# Patient Record
Sex: Male | Born: 1940
Health system: Southern US, Community
[De-identification: ages and names within clinical notes are randomized; demographics above are authoritative.]

## PROBLEM LIST (undated history)

## (undated) DIAGNOSIS — E7800 Pure hypercholesterolemia, unspecified: Secondary | ICD-10-CM

## (undated) DIAGNOSIS — K219 Gastro-esophageal reflux disease without esophagitis: Secondary | ICD-10-CM

## (undated) DIAGNOSIS — K649 Unspecified hemorrhoids: Secondary | ICD-10-CM

## (undated) DIAGNOSIS — Q2381 Bicuspid aortic valve: Secondary | ICD-10-CM

## (undated) DIAGNOSIS — I7121 Aneurysm of the ascending aorta, without rupture: Secondary | ICD-10-CM

## (undated) DIAGNOSIS — M545 Low back pain, unspecified: Secondary | ICD-10-CM

## (undated) DIAGNOSIS — I5031 Acute diastolic (congestive) heart failure: Secondary | ICD-10-CM

## (undated) DIAGNOSIS — K648 Other hemorrhoids: Secondary | ICD-10-CM

## (undated) DIAGNOSIS — K760 Fatty (change of) liver, not elsewhere classified: Secondary | ICD-10-CM

## (undated) DIAGNOSIS — T148XXA Other injury of unspecified body region, initial encounter: Secondary | ICD-10-CM

## (undated) DIAGNOSIS — E876 Hypokalemia: Secondary | ICD-10-CM

## (undated) DIAGNOSIS — M1990 Unspecified osteoarthritis, unspecified site: Secondary | ICD-10-CM

## (undated) DIAGNOSIS — D62 Acute posthemorrhagic anemia: Secondary | ICD-10-CM

## (undated) DIAGNOSIS — D126 Benign neoplasm of colon, unspecified: Secondary | ICD-10-CM

## (undated) DIAGNOSIS — Q231 Congenital insufficiency of aortic valve: Secondary | ICD-10-CM

## (undated) DIAGNOSIS — C801 Malignant (primary) neoplasm, unspecified: Secondary | ICD-10-CM

## (undated) DIAGNOSIS — I10 Essential (primary) hypertension: Secondary | ICD-10-CM

## (undated) DIAGNOSIS — I712 Thoracic aortic aneurysm, without rupture: Secondary | ICD-10-CM

## (undated) DIAGNOSIS — I350 Nonrheumatic aortic (valve) stenosis: Secondary | ICD-10-CM

## (undated) DIAGNOSIS — I480 Paroxysmal atrial fibrillation: Secondary | ICD-10-CM

## (undated) DIAGNOSIS — J189 Pneumonia, unspecified organism: Secondary | ICD-10-CM

## (undated) DIAGNOSIS — K922 Gastrointestinal hemorrhage, unspecified: Secondary | ICD-10-CM

## (undated) HISTORY — DX: Essential (primary) hypertension: I10

## (undated) HISTORY — DX: Fatty (change of) liver, not elsewhere classified: K76.0

## (undated) HISTORY — DX: Other hemorrhoids: K64.8

## (undated) HISTORY — DX: Pneumonia, unspecified organism: J18.9

## (undated) HISTORY — DX: Unspecified hemorrhoids: K64.9

## (undated) HISTORY — PX: COLONOSCOPY: SHX174

## (undated) HISTORY — PX: LUMBAR LAMINECTOMY: SHX95

## (undated) HISTORY — DX: Hypokalemia: E87.6

## (undated) HISTORY — DX: Low back pain, unspecified: M54.50

## (undated) HISTORY — DX: Low back pain: M54.5

## (undated) HISTORY — DX: Benign neoplasm of colon, unspecified: D12.6

## (undated) HISTORY — DX: Pure hypercholesterolemia, unspecified: E78.00

## (undated) HISTORY — PX: BACK SURGERY: SHX140

## (undated) HISTORY — DX: Unspecified osteoarthritis, unspecified site: M19.90

## (undated) HISTORY — PX: HEMORRHOID SURGERY: SHX153

## (undated) HISTORY — PX: POLYPECTOMY: SHX149

---

## 2001-03-27 ENCOUNTER — Encounter: Payer: Self-pay | Admitting: Internal Medicine

## 2001-03-27 ENCOUNTER — Ambulatory Visit (HOSPITAL_COMMUNITY): Admission: RE | Admit: 2001-03-27 | Discharge: 2001-03-27 | Payer: Self-pay | Admitting: Internal Medicine

## 2004-12-14 ENCOUNTER — Ambulatory Visit: Payer: Self-pay | Admitting: Internal Medicine

## 2004-12-16 ENCOUNTER — Ambulatory Visit: Payer: Self-pay | Admitting: Internal Medicine

## 2004-12-21 ENCOUNTER — Ambulatory Visit: Payer: Self-pay | Admitting: Cardiology

## 2005-05-19 ENCOUNTER — Ambulatory Visit: Payer: Self-pay | Admitting: Internal Medicine

## 2005-07-16 ENCOUNTER — Ambulatory Visit: Payer: Self-pay | Admitting: Internal Medicine

## 2005-07-20 ENCOUNTER — Ambulatory Visit: Payer: Self-pay | Admitting: Internal Medicine

## 2005-11-04 ENCOUNTER — Ambulatory Visit: Payer: Self-pay | Admitting: Internal Medicine

## 2006-01-31 ENCOUNTER — Ambulatory Visit: Payer: Self-pay | Admitting: Internal Medicine

## 2006-02-01 ENCOUNTER — Ambulatory Visit: Payer: Self-pay | Admitting: Internal Medicine

## 2006-03-23 ENCOUNTER — Ambulatory Visit: Payer: Self-pay | Admitting: Internal Medicine

## 2006-03-25 ENCOUNTER — Ambulatory Visit: Payer: Self-pay | Admitting: Internal Medicine

## 2006-05-04 ENCOUNTER — Ambulatory Visit: Payer: Self-pay | Admitting: Internal Medicine

## 2006-05-06 ENCOUNTER — Ambulatory Visit: Payer: Self-pay | Admitting: Internal Medicine

## 2006-08-26 ENCOUNTER — Ambulatory Visit: Payer: Self-pay | Admitting: Internal Medicine

## 2006-11-22 ENCOUNTER — Ambulatory Visit: Payer: Self-pay | Admitting: Internal Medicine

## 2006-11-22 LAB — CONVERTED CEMR LAB
ALT: 39 units/L (ref 0–40)
AST: 26 units/L (ref 0–37)
Albumin: 4 g/dL (ref 3.5–5.2)
Alkaline Phosphatase: 64 units/L (ref 39–117)
BUN: 20 mg/dL (ref 6–23)
Bilirubin, Direct: 0.1 mg/dL (ref 0.0–0.3)
CO2: 30 meq/L (ref 19–32)
Calcium: 9.1 mg/dL (ref 8.4–10.5)
Chloride: 106 meq/L (ref 96–112)
Cholesterol: 203 mg/dL (ref 0–200)
Creatinine, Ser: 0.9 mg/dL (ref 0.4–1.5)
Direct LDL: 141.8 mg/dL
GFR calc Af Amer: 109 mL/min
GFR calc non Af Amer: 90 mL/min
Glucose, Bld: 110 mg/dL — ABNORMAL HIGH (ref 70–99)
HDL: 35.4 mg/dL — ABNORMAL LOW (ref >39.0)
Hgb A1c MFr Bld: 5.6 % (ref 4.6–6.0)
Potassium: 3.9 meq/L (ref 3.5–5.1)
Sodium: 143 meq/L (ref 135–145)
Total Bilirubin: 0.6 mg/dL (ref 0.3–1.2)
Total CHOL/HDL Ratio: 5.7
Total Protein: 6.5 g/dL (ref 6.0–8.3)
Triglycerides: 200 mg/dL — ABNORMAL HIGH (ref 0–149)
VLDL: 40 mg/dL (ref 0–40)

## 2006-11-24 ENCOUNTER — Ambulatory Visit: Payer: Self-pay | Admitting: Internal Medicine

## 2006-12-21 ENCOUNTER — Ambulatory Visit: Payer: Self-pay | Admitting: Internal Medicine

## 2007-02-27 ENCOUNTER — Ambulatory Visit: Payer: Self-pay | Admitting: Internal Medicine

## 2007-03-14 ENCOUNTER — Ambulatory Visit: Payer: Self-pay

## 2007-03-27 ENCOUNTER — Ambulatory Visit: Payer: Self-pay | Admitting: Gastroenterology

## 2007-04-08 ENCOUNTER — Encounter: Payer: Self-pay | Admitting: Internal Medicine

## 2007-04-08 DIAGNOSIS — B009 Herpesviral infection, unspecified: Secondary | ICD-10-CM | POA: Insufficient documentation

## 2007-04-08 DIAGNOSIS — I10 Essential (primary) hypertension: Secondary | ICD-10-CM | POA: Insufficient documentation

## 2007-04-08 DIAGNOSIS — E785 Hyperlipidemia, unspecified: Secondary | ICD-10-CM | POA: Insufficient documentation

## 2007-04-08 DIAGNOSIS — I6529 Occlusion and stenosis of unspecified carotid artery: Secondary | ICD-10-CM | POA: Insufficient documentation

## 2007-04-08 DIAGNOSIS — M549 Dorsalgia, unspecified: Secondary | ICD-10-CM | POA: Insufficient documentation

## 2007-04-21 ENCOUNTER — Ambulatory Visit: Payer: Self-pay | Admitting: Gastroenterology

## 2007-08-09 ENCOUNTER — Ambulatory Visit: Payer: Self-pay | Admitting: Internal Medicine

## 2007-08-09 DIAGNOSIS — R7309 Other abnormal glucose: Secondary | ICD-10-CM | POA: Insufficient documentation

## 2007-08-09 LAB — CONVERTED CEMR LAB
ALT: 25 units/L (ref 0–53)
AST: 23 units/L (ref 0–37)
Albumin: 4 g/dL (ref 3.5–5.2)
Alkaline Phosphatase: 66 units/L (ref 39–117)
BUN: 18 mg/dL (ref 6–23)
Bilirubin, Direct: 0.2 mg/dL (ref 0.0–0.3)
CO2: 29 meq/L (ref 19–32)
Calcium: 9.4 mg/dL (ref 8.4–10.5)
Chloride: 108 meq/L (ref 96–112)
Cholesterol: 208 mg/dL (ref 0–200)
Creatinine, Ser: 1.1 mg/dL (ref 0.4–1.5)
Direct LDL: 150.7 mg/dL
GFR calc Af Amer: 86 mL/min
GFR calc non Af Amer: 71 mL/min
Glucose, Bld: 107 mg/dL — ABNORMAL HIGH (ref 70–99)
HDL: 32.3 mg/dL — ABNORMAL LOW (ref >39.0)
Hgb A1c MFr Bld: 5.4 % (ref 4.6–6.0)
Potassium: 4.9 meq/L (ref 3.5–5.1)
Sodium: 144 meq/L (ref 135–145)
Total Bilirubin: 0.9 mg/dL (ref 0.3–1.2)
Total CHOL/HDL Ratio: 6.4
Total Protein: 6.4 g/dL (ref 6.0–8.3)
Triglycerides: 142 mg/dL (ref 0–149)
VLDL: 28 mg/dL (ref 0–40)

## 2007-08-14 ENCOUNTER — Ambulatory Visit: Payer: Self-pay | Admitting: Internal Medicine

## 2007-08-14 DIAGNOSIS — M1990 Unspecified osteoarthritis, unspecified site: Secondary | ICD-10-CM | POA: Insufficient documentation

## 2007-09-14 DIAGNOSIS — I499 Cardiac arrhythmia, unspecified: Secondary | ICD-10-CM

## 2007-09-14 HISTORY — DX: Cardiac arrhythmia, unspecified: I49.9

## 2007-11-20 ENCOUNTER — Ambulatory Visit: Payer: Self-pay | Admitting: Internal Medicine

## 2007-11-20 LAB — CONVERTED CEMR LAB
ALT: 39 units/L (ref 0–53)
AST: 30 units/L (ref 0–37)
Albumin: 4.4 g/dL (ref 3.5–5.2)
Alkaline Phosphatase: 74 units/L (ref 39–117)
BUN: 17 mg/dL (ref 6–23)
Bilirubin, Direct: 0.1 mg/dL (ref 0.0–0.3)
CO2: 30 meq/L (ref 19–32)
Calcium: 9.5 mg/dL (ref 8.4–10.5)
Chloride: 104 meq/L (ref 96–112)
Cholesterol: 201 mg/dL (ref 0–200)
Creatinine, Ser: 1.1 mg/dL (ref 0.4–1.5)
Direct LDL: 151.5 mg/dL
GFR calc Af Amer: 86 mL/min
GFR calc non Af Amer: 71 mL/min
Glucose, Bld: 101 mg/dL — ABNORMAL HIGH (ref 70–99)
HDL: 35.5 mg/dL — ABNORMAL LOW (ref >39.0)
Potassium: 4.7 meq/L (ref 3.5–5.1)
Sodium: 141 meq/L (ref 135–145)
TSH: 3.45 microintl units/mL (ref 0.35–5.50)
Total Bilirubin: 0.9 mg/dL (ref 0.3–1.2)
Total CHOL/HDL Ratio: 5.7
Total Protein: 7.2 g/dL (ref 6.0–8.3)
Triglycerides: 132 mg/dL (ref 0–149)
VLDL: 26 mg/dL (ref 0–40)

## 2007-11-23 ENCOUNTER — Ambulatory Visit: Payer: Self-pay | Admitting: Internal Medicine

## 2007-11-23 DIAGNOSIS — B356 Tinea cruris: Secondary | ICD-10-CM | POA: Insufficient documentation

## 2008-04-03 ENCOUNTER — Ambulatory Visit: Payer: Self-pay

## 2008-04-03 ENCOUNTER — Encounter: Payer: Self-pay | Admitting: Internal Medicine

## 2008-05-21 ENCOUNTER — Ambulatory Visit: Payer: Self-pay | Admitting: Internal Medicine

## 2008-05-21 LAB — CONVERTED CEMR LAB
ALT: 30 units/L (ref 0–53)
AST: 23 units/L (ref 0–37)
Albumin: 4 g/dL (ref 3.5–5.2)
Alkaline Phosphatase: 63 units/L (ref 39–117)
BUN: 20 mg/dL (ref 6–23)
Basophils Absolute: 0 10*3/uL (ref 0.0–0.1)
Basophils Relative: 0.6 % (ref 0.0–3.0)
Bilirubin Urine: NEGATIVE
Bilirubin, Direct: 0.1 mg/dL (ref 0.0–0.3)
CO2: 28 meq/L (ref 19–32)
Calcium: 9.4 mg/dL (ref 8.4–10.5)
Chloride: 108 meq/L (ref 96–112)
Cholesterol: 187 mg/dL (ref 0–200)
Creatinine, Ser: 1.1 mg/dL (ref 0.4–1.5)
Eosinophils Absolute: 0.3 10*3/uL (ref 0.0–0.7)
Eosinophils Relative: 3.6 % (ref 0.0–5.0)
GFR calc Af Amer: 86 mL/min
GFR calc non Af Amer: 71 mL/min
Glucose, Bld: 110 mg/dL — ABNORMAL HIGH (ref 70–99)
HCT: 44.6 % (ref 39.0–52.0)
HDL: 32.5 mg/dL — ABNORMAL LOW (ref >39.0)
Hemoglobin: 15.6 g/dL (ref 13.0–17.0)
Ketones, ur: NEGATIVE mg/dL
LDL Cholesterol: 118 mg/dL — ABNORMAL HIGH (ref 0–99)
Leukocytes, UA: NEGATIVE
Lymphocytes Relative: 23.1 % (ref 12.0–46.0)
MCHC: 34.9 g/dL (ref 30.0–36.0)
MCV: 91.2 fL (ref 78.0–100.0)
Monocytes Absolute: 0.8 10*3/uL (ref 0.1–1.0)
Monocytes Relative: 10.5 % (ref 3.0–12.0)
Neutro Abs: 4.4 10*3/uL (ref 1.4–7.7)
Neutrophils Relative %: 62.2 % (ref 43.0–77.0)
Nitrite: NEGATIVE
PSA: 1.72 ng/mL (ref 0.10–4.00)
Platelets: 194 10*3/uL (ref 150–400)
Potassium: 4.1 meq/L (ref 3.5–5.1)
RBC: 4.89 M/uL (ref 4.22–5.81)
RDW: 12 % (ref 11.5–14.6)
Sodium: 142 meq/L (ref 135–145)
Specific Gravity, Urine: 1.025 (ref 1.000–1.03)
TSH: 4.1 microintl units/mL (ref 0.35–5.50)
Total Bilirubin: 0.8 mg/dL (ref 0.3–1.2)
Total CHOL/HDL Ratio: 5.8
Total Protein, Urine: NEGATIVE mg/dL
Total Protein: 6.7 g/dL (ref 6.0–8.3)
Triglycerides: 184 mg/dL — ABNORMAL HIGH (ref 0–149)
Urine Glucose: NEGATIVE mg/dL
Urobilinogen, UA: 0.2 (ref 0.0–1.0)
VLDL: 37 mg/dL (ref 0–40)
WBC: 7.2 10*3/uL (ref 4.5–10.5)
pH: 5.5 (ref 5.0–8.0)

## 2008-05-27 ENCOUNTER — Ambulatory Visit: Payer: Self-pay | Admitting: Internal Medicine

## 2008-07-28 ENCOUNTER — Ambulatory Visit: Payer: Self-pay | Admitting: Internal Medicine

## 2008-07-28 ENCOUNTER — Inpatient Hospital Stay (HOSPITAL_COMMUNITY): Admission: EM | Admit: 2008-07-28 | Discharge: 2008-07-30 | Payer: Self-pay | Admitting: Emergency Medicine

## 2008-07-28 ENCOUNTER — Ambulatory Visit: Payer: Self-pay | Admitting: Cardiology

## 2008-07-29 ENCOUNTER — Encounter: Payer: Self-pay | Admitting: Internal Medicine

## 2008-08-01 ENCOUNTER — Encounter (INDEPENDENT_AMBULATORY_CARE_PROVIDER_SITE_OTHER): Payer: Self-pay | Admitting: *Deleted

## 2008-08-05 ENCOUNTER — Ambulatory Visit: Payer: Self-pay | Admitting: Internal Medicine

## 2008-08-05 DIAGNOSIS — I480 Paroxysmal atrial fibrillation: Secondary | ICD-10-CM | POA: Insufficient documentation

## 2008-08-13 ENCOUNTER — Ambulatory Visit: Payer: Self-pay | Admitting: Cardiology

## 2008-08-13 LAB — CONVERTED CEMR LAB
Free T4: 0.6 ng/dL (ref 0.6–1.6)
TSH: 3.14 microintl units/mL (ref 0.35–5.50)

## 2008-10-03 ENCOUNTER — Ambulatory Visit: Payer: Self-pay | Admitting: Internal Medicine

## 2008-10-03 LAB — CONVERTED CEMR LAB
ALT: 39 units/L (ref 0–53)
AST: 27 units/L (ref 0–37)
Albumin: 4.2 g/dL (ref 3.5–5.2)
Alkaline Phosphatase: 56 units/L (ref 39–117)
BUN: 17 mg/dL (ref 6–23)
Basophils Absolute: 0 10*3/uL (ref 0.0–0.1)
Basophils Relative: 0.3 % (ref 0.0–3.0)
Bilirubin, Direct: 0.1 mg/dL (ref 0.0–0.3)
CO2: 32 meq/L (ref 19–32)
Calcium: 9.6 mg/dL (ref 8.4–10.5)
Chloride: 106 meq/L (ref 96–112)
Cholesterol: 121 mg/dL (ref 0–200)
Creatinine, Ser: 1.1 mg/dL (ref 0.4–1.5)
Eosinophils Absolute: 0.2 10*3/uL (ref 0.0–0.7)
Eosinophils Relative: 3.6 % (ref 0.0–5.0)
GFR calc Af Amer: 86 mL/min
GFR calc non Af Amer: 71 mL/min
Glucose, Bld: 120 mg/dL — ABNORMAL HIGH (ref 70–99)
HCT: 44.9 % (ref 39.0–52.0)
HDL: 31.5 mg/dL — ABNORMAL LOW (ref >39.0)
Hemoglobin: 15.6 g/dL (ref 13.0–17.0)
LDL Cholesterol: 67 mg/dL (ref 0–99)
Lymphocytes Relative: 23.9 % (ref 12.0–46.0)
MCHC: 34.8 g/dL (ref 30.0–36.0)
MCV: 90.2 fL (ref 78.0–100.0)
Monocytes Absolute: 0.6 10*3/uL (ref 0.1–1.0)
Monocytes Relative: 8.1 % (ref 3.0–12.0)
Neutro Abs: 4.4 10*3/uL (ref 1.4–7.7)
Neutrophils Relative %: 64.1 % (ref 43.0–77.0)
Platelets: 182 10*3/uL (ref 150–400)
Potassium: 4.4 meq/L (ref 3.5–5.1)
RBC: 4.98 M/uL (ref 4.22–5.81)
RDW: 11.5 % (ref 11.5–14.6)
Sodium: 143 meq/L (ref 135–145)
TSH: 2.88 microintl units/mL (ref 0.35–5.50)
Total Bilirubin: 0.8 mg/dL (ref 0.3–1.2)
Total CHOL/HDL Ratio: 3.8
Total CK: 111 units/L (ref 7–195)
Total Protein: 7.2 g/dL (ref 6.0–8.3)
Triglycerides: 112 mg/dL (ref 0–149)
VLDL: 22 mg/dL (ref 0–40)
WBC: 6.8 10*3/uL (ref 4.5–10.5)

## 2008-10-07 ENCOUNTER — Ambulatory Visit: Payer: Self-pay | Admitting: Internal Medicine

## 2008-10-29 ENCOUNTER — Ambulatory Visit: Payer: Self-pay | Admitting: Internal Medicine

## 2008-10-29 DIAGNOSIS — K6289 Other specified diseases of anus and rectum: Secondary | ICD-10-CM | POA: Insufficient documentation

## 2008-10-29 DIAGNOSIS — K649 Unspecified hemorrhoids: Secondary | ICD-10-CM | POA: Insufficient documentation

## 2008-12-04 ENCOUNTER — Telehealth: Payer: Self-pay | Admitting: Internal Medicine

## 2008-12-06 ENCOUNTER — Telehealth: Payer: Self-pay | Admitting: Gastroenterology

## 2008-12-06 ENCOUNTER — Telehealth: Payer: Self-pay | Admitting: Internal Medicine

## 2008-12-31 ENCOUNTER — Encounter: Payer: Self-pay | Admitting: Internal Medicine

## 2009-01-21 ENCOUNTER — Ambulatory Visit (HOSPITAL_COMMUNITY): Admission: RE | Admit: 2009-01-21 | Discharge: 2009-01-21 | Payer: Self-pay | Admitting: Surgery

## 2009-01-21 ENCOUNTER — Encounter (INDEPENDENT_AMBULATORY_CARE_PROVIDER_SITE_OTHER): Payer: Self-pay | Admitting: Surgery

## 2009-03-06 ENCOUNTER — Ambulatory Visit: Payer: Self-pay | Admitting: Internal Medicine

## 2009-03-06 LAB — CONVERTED CEMR LAB
BUN: 14 mg/dL (ref 6–23)
CO2: 31 meq/L (ref 19–32)
Calcium: 9.2 mg/dL (ref 8.4–10.5)
Chloride: 105 meq/L (ref 96–112)
Cholesterol: 137 mg/dL (ref 0–200)
Creatinine, Ser: 0.9 mg/dL (ref 0.4–1.5)
GFR calc non Af Amer: 88.92 mL/min (ref >60)
Glucose, Bld: 108 mg/dL — ABNORMAL HIGH (ref 70–99)
HDL: 36.8 mg/dL — ABNORMAL LOW (ref >39.00)
LDL Cholesterol: 85 mg/dL (ref 0–99)
Potassium: 4.6 meq/L (ref 3.5–5.1)
Sodium: 145 meq/L (ref 135–145)
TSH: 2.18 microintl units/mL (ref 0.35–5.50)
Total CHOL/HDL Ratio: 4
Triglycerides: 76 mg/dL (ref 0.0–149.0)
VLDL: 15.2 mg/dL (ref 0.0–40.0)

## 2009-03-10 ENCOUNTER — Ambulatory Visit: Payer: Self-pay | Admitting: Internal Medicine

## 2009-03-11 ENCOUNTER — Ambulatory Visit: Payer: Self-pay | Admitting: Cardiology

## 2009-06-30 ENCOUNTER — Ambulatory Visit: Payer: Self-pay | Admitting: Internal Medicine

## 2009-06-30 DIAGNOSIS — Z87891 Personal history of nicotine dependence: Secondary | ICD-10-CM | POA: Insufficient documentation

## 2009-12-24 ENCOUNTER — Ambulatory Visit: Payer: Self-pay | Admitting: Internal Medicine

## 2009-12-24 LAB — CONVERTED CEMR LAB
ALT: 35 units/L (ref 0–53)
AST: 32 units/L (ref 0–37)
Albumin: 4.3 g/dL (ref 3.5–5.2)
Alkaline Phosphatase: 65 units/L (ref 39–117)
BUN: 16 mg/dL (ref 6–23)
Basophils Absolute: 0 10*3/uL (ref 0.0–0.1)
Basophils Relative: 0.3 % (ref 0.0–3.0)
Bilirubin Urine: NEGATIVE
Bilirubin, Direct: 0.1 mg/dL (ref 0.0–0.3)
CO2: 32 meq/L (ref 19–32)
Calcium: 9.3 mg/dL (ref 8.4–10.5)
Chloride: 104 meq/L (ref 96–112)
Cholesterol: 140 mg/dL (ref 0–200)
Creatinine, Ser: 1.1 mg/dL (ref 0.4–1.5)
Eosinophils Absolute: 0.2 10*3/uL (ref 0.0–0.7)
Eosinophils Relative: 2.5 % (ref 0.0–5.0)
GFR calc non Af Amer: 70.37 mL/min (ref >60)
Glucose, Bld: 116 mg/dL — ABNORMAL HIGH (ref 70–99)
HCT: 44.6 % (ref 39.0–52.0)
HDL: 37.5 mg/dL — ABNORMAL LOW (ref >39.00)
Hemoglobin, Urine: NEGATIVE
Hemoglobin: 15.3 g/dL (ref 13.0–17.0)
Ketones, ur: NEGATIVE mg/dL
LDL Cholesterol: 76 mg/dL (ref 0–99)
Leukocytes, UA: NEGATIVE
Lymphocytes Relative: 20.6 % (ref 12.0–46.0)
Lymphs Abs: 1.7 10*3/uL (ref 0.7–4.0)
MCHC: 34.3 g/dL (ref 30.0–36.0)
MCV: 91.6 fL (ref 78.0–100.0)
Monocytes Absolute: 0.6 10*3/uL (ref 0.1–1.0)
Monocytes Relative: 7.3 % (ref 3.0–12.0)
Neutro Abs: 5.8 10*3/uL (ref 1.4–7.7)
Neutrophils Relative %: 69.3 % (ref 43.0–77.0)
Nitrite: NEGATIVE
PSA: 1.82 ng/mL (ref 0.10–4.00)
Platelets: 178 10*3/uL (ref 150.0–400.0)
Potassium: 3.9 meq/L (ref 3.5–5.1)
RBC: 4.87 M/uL (ref 4.22–5.81)
RDW: 12.5 % (ref 11.5–14.6)
Sodium: 143 meq/L (ref 135–145)
Specific Gravity, Urine: 1.01 (ref 1.000–1.030)
TSH: 4.12 microintl units/mL (ref 0.35–5.50)
Total Bilirubin: 0.4 mg/dL (ref 0.3–1.2)
Total CHOL/HDL Ratio: 4
Total Protein, Urine: NEGATIVE mg/dL
Total Protein: 7.7 g/dL (ref 6.0–8.3)
Triglycerides: 131 mg/dL (ref 0.0–149.0)
Urine Glucose: NEGATIVE mg/dL
Urobilinogen, UA: 0.2 (ref 0.0–1.0)
VLDL: 26.2 mg/dL (ref 0.0–40.0)
WBC: 8.4 10*3/uL (ref 4.5–10.5)
pH: 6 (ref 5.0–8.0)

## 2009-12-29 ENCOUNTER — Ambulatory Visit: Payer: Self-pay | Admitting: Internal Medicine

## 2009-12-29 DIAGNOSIS — K5909 Other constipation: Secondary | ICD-10-CM | POA: Insufficient documentation

## 2010-03-10 ENCOUNTER — Ambulatory Visit: Payer: Self-pay | Admitting: Cardiology

## 2010-03-10 DIAGNOSIS — I359 Nonrheumatic aortic valve disorder, unspecified: Secondary | ICD-10-CM | POA: Insufficient documentation

## 2010-03-30 ENCOUNTER — Ambulatory Visit: Payer: Self-pay

## 2010-03-30 ENCOUNTER — Ambulatory Visit (HOSPITAL_COMMUNITY): Admission: RE | Admit: 2010-03-30 | Discharge: 2010-03-30 | Payer: Self-pay | Admitting: Cardiology

## 2010-03-30 ENCOUNTER — Ambulatory Visit: Payer: Self-pay | Admitting: Cardiovascular Disease

## 2010-03-30 ENCOUNTER — Encounter: Payer: Self-pay | Admitting: Cardiology

## 2010-06-22 ENCOUNTER — Ambulatory Visit: Payer: Self-pay | Admitting: Internal Medicine

## 2010-07-22 ENCOUNTER — Ambulatory Visit: Payer: Self-pay | Admitting: Internal Medicine

## 2010-07-22 LAB — CONVERTED CEMR LAB
ALT: 35 units/L (ref 0–53)
AST: 36 units/L (ref 0–37)
Albumin: 4.1 g/dL (ref 3.5–5.2)
Alkaline Phosphatase: 76 units/L (ref 39–117)
BUN: 17 mg/dL (ref 6–23)
Bilirubin, Direct: 0.1 mg/dL (ref 0.0–0.3)
CO2: 30 meq/L (ref 19–32)
Calcium: 9.3 mg/dL (ref 8.4–10.5)
Chloride: 103 meq/L (ref 96–112)
Cholesterol: 142 mg/dL (ref 0–200)
Creatinine, Ser: 1 mg/dL (ref 0.4–1.5)
GFR calc non Af Amer: 76.65 mL/min (ref >60)
Glucose, Bld: 112 mg/dL — ABNORMAL HIGH (ref 70–99)
HDL: 31.7 mg/dL — ABNORMAL LOW (ref >39.00)
LDL Cholesterol: 79 mg/dL (ref 0–99)
Potassium: 4 meq/L (ref 3.5–5.1)
Sodium: 141 meq/L (ref 135–145)
TSH: 4.29 microintl units/mL (ref 0.35–5.50)
Total Bilirubin: 0.4 mg/dL (ref 0.3–1.2)
Total CHOL/HDL Ratio: 4
Total Protein: 6.9 g/dL (ref 6.0–8.3)
Triglycerides: 156 mg/dL — ABNORMAL HIGH (ref 0.0–149.0)
VLDL: 31.2 mg/dL (ref 0.0–40.0)

## 2010-07-27 ENCOUNTER — Ambulatory Visit: Payer: Self-pay | Admitting: Internal Medicine

## 2010-08-10 ENCOUNTER — Encounter: Payer: Self-pay | Admitting: Internal Medicine

## 2010-08-10 ENCOUNTER — Ambulatory Visit: Payer: Self-pay | Admitting: Internal Medicine

## 2010-08-10 DIAGNOSIS — R42 Dizziness and giddiness: Secondary | ICD-10-CM | POA: Insufficient documentation

## 2010-08-11 ENCOUNTER — Emergency Department (HOSPITAL_COMMUNITY)
Admission: EM | Admit: 2010-08-11 | Discharge: 2010-08-12 | Payer: Self-pay | Source: Home / Self Care | Admitting: Emergency Medicine

## 2010-08-13 ENCOUNTER — Encounter: Payer: Self-pay | Admitting: Physician Assistant

## 2010-08-13 ENCOUNTER — Ambulatory Visit: Payer: Self-pay | Admitting: Cardiovascular Disease

## 2010-08-25 ENCOUNTER — Telehealth (INDEPENDENT_AMBULATORY_CARE_PROVIDER_SITE_OTHER): Payer: Self-pay | Admitting: *Deleted

## 2010-08-26 ENCOUNTER — Ambulatory Visit: Payer: Self-pay | Admitting: Cardiology

## 2010-08-26 ENCOUNTER — Ambulatory Visit: Payer: Self-pay

## 2010-08-26 ENCOUNTER — Encounter: Payer: Self-pay | Admitting: *Deleted

## 2010-08-26 ENCOUNTER — Encounter: Payer: Self-pay | Admitting: Cardiology

## 2010-08-26 ENCOUNTER — Encounter (HOSPITAL_COMMUNITY)
Admission: RE | Admit: 2010-08-26 | Discharge: 2010-10-13 | Payer: Self-pay | Source: Home / Self Care | Attending: Cardiology | Admitting: Cardiology

## 2010-09-17 ENCOUNTER — Ambulatory Visit
Admission: RE | Admit: 2010-09-17 | Discharge: 2010-09-17 | Payer: Self-pay | Source: Home / Self Care | Attending: Cardiology | Admitting: Cardiology

## 2010-10-09 ENCOUNTER — Ambulatory Visit: Admit: 2010-10-09 | Payer: Self-pay | Admitting: Cardiology

## 2010-10-15 NOTE — Progress Notes (Signed)
Summary: Rectal Pain   Phone Note From Other Clinic   Caller: MARY X [phone] @ DR [name] Call For: DR Jarold Motto Reason for Call: Schedule Patient Appt Summary of Call: First available NP3 is 01-07-09. Would like pt seen sooner for Rectal Pain. Initial call taken by: Leanor Kail [employer],  December 06, 2008 9:19 AM  Follow-up for Phone Call        left message on [facility] VM Follow-up by: Harlow Mares CMA,  December 06, 2008 10:18 AM  Additional Follow-up for Phone Call Additional follow up Details #1::        called mary back and gave her a appt for 12-17-2008 at 2:45pm she will advise the patient . Additional Follow-up by: Harlow Mares CMA,  December 06, 2008 10:23 AM

## 2010-10-15 NOTE — Assessment & Plan Note (Signed)
Summary: PHYSICAL-PT DON'T HAVE MEDICARE PER PT STILL WORKING-$50-STC   Vital Signs:  Patient Profile:   70 Years Old Male Weight:      213 pounds Temp:     97.7 degrees F oral Pulse rate:   72 / minute BP sitting:   146 / 80  (left arm)  Vitals Entered By: Tora Perches (May 27, 2008 9:00 AM)                 Chief Complaint:  Preventive Care.  History of Present Illness: The patient presents for a wellness examination.       Current Allergies (reviewed today): ! SULFA CODEINE  Past Medical History:    Reviewed history from 08/14/2007 and no changes required:       Hyperlipidemia       Hypertension       Osteoarthritis       Low back pain   Family History:    Reviewed history from 11/23/2007 and no changes required:       Family History Hypertension  Social History:    Reviewed history from 08/14/2007 and no changes required:       Occupation: Nurse, children's       Married       Never Smoked    Review of Systems  The patient denies breast masses, anorexia, fever, weight loss, weight gain, vision loss, decreased hearing, hoarseness, chest pain, syncope, dyspnea on exertion, peripheral edema, prolonged cough, headaches, hemoptysis, abdominal pain, melena, hematochezia, severe indigestion/heartburn, hematuria, incontinence, genital sores, muscle weakness, suspicious skin lesions, transient blindness, difficulty walking, depression, unusual weight change, abnormal bleeding, enlarged lymph nodes, angioedema, and testicular masses.     Physical Exam  General:     Well-developed,well-nourished,in no acute distress; alert,appropriate and cooperative throughout examination Head:     Normocephalic and atraumatic without obvious abnormalities. No apparent alopecia or balding. Eyes:     No corneal or conjunctival inflammation noted. EOMI. Perrla. Funduscopic exam benign, without hemorrhages, exudates or papilledema. Vision grossly normal. Ears:     External ear  exam shows no significant lesions or deformities.  Otoscopic examination reveals clear canals, tympanic membranes are intact bilaterally without bulging, retraction, inflammation or discharge. Hearing is grossly normal bilaterally. Nose:     External nasal examination shows no deformity or inflammation. Nasal mucosa are pink and moist without lesions or exudates. Mouth:     Oral mucosa and oropharynx without lesions or exudates.  Teeth in good repair. Neck:     No deformities, masses, or tenderness noted. Chest Wall:     No deformities, masses, tenderness or gynecomastia noted. Lungs:     Normal respiratory effort, chest expands symmetrically. Lungs are clear to auscultation, no crackles or wheezes. Heart:     Normal rate and regular rhythm. S1 and S2 normal without gallop, murmur, click, rub or other extra sounds. Abdomen:     Bowel sounds positive,abdomen soft and non-tender without masses, organomegaly or hernias noted. Rectal:     No external abnormalities noted. Normal sphincter tone. No rectal masses or tenderness. Genitalia:     Testes bilaterally descended without nodularity, tenderness or masses. No scrotal masses or lesions. No penis lesions or urethral discharge. Prostate:     Prostate gland firm and smooth, no enlargement, nodularity, tenderness, mass, asymmetry or induration. Msk:     No deformity or scoliosis noted of thoracic or lumbar spine.   Pulses:     R and L carotid,radial,femoral,dorsalis pedis and posterior tibial pulses are  full and equal bilaterally Extremities:     No clubbing, cyanosis, edema, or deformity noted with normal full range of motion of all joints.   Neurologic:     No cranial nerve deficits noted. Station and gait are normal. Plantar reflexes are down-going bilaterally. DTRs are symmetrical throughout. Sensory, motor and coordinative functions appear intact. Skin:     Intact without suspicious lesions or rashes Inguinal Nodes:     No significant  adenopathy Psych:     Cognition and judgment appear intact. Alert and cooperative with normal attention span and concentration. No apparent delusions, illusions, hallucinations    Impression & Recommendations:  Problem # 1:  WELL ADULT EXAM (ICD-V70.0) Assessment: Comment Only The labs were reviewd with the patient. l Orders: EKG w/ Interpretation (93000)  Reviewed preventive care protocols, scheduled due services, and updated immunizations.   Problem # 2:  HYPERTENSION (ICD-401.9)  His updated medication list for this problem includes:    Azor 10-40 Mg Tabs (Amlodipine-olmesartan) .[name]... 1 once daily    Triamterene-hctz 37.5-25 Mg Caps (Triamterene-hctz) .[name]... 1 po qam   Problem # 3:  HYPERLIPIDEMIA (ICD-272.4) Assessment: Comment Only  His updated medication list for this problem includes:    Lovastatin 40 Mg Tabs (Lovastatin) .[name]... 1 po qd   Complete Medication List: 1)  Azor 10-40 Mg Tabs (Amlodipine-olmesartan) .[name].. 1 once daily 2)  Triamterene-hctz 37.5-25 Mg Caps (Triamterene-hctz) .[name].. 1 po qam 3)  Lovastatin 40 Mg Tabs (Lovastatin) .[name].. 1 po qd 4)  Aspirin 325 Mg Tabs (Aspirin) .[name].. 1 qd 5)  Vitamin D3 1000 Unit Tabs (Cholecalciferol) .[name].. 1 qd   Patient Instructions: 1)  Please schedule a follow-up appointment in 6 months. 2)  BMP prior to visit, ICD-9: 3)  Hepatic Panel prior to visit, ICD-9: 4)  Lipid Panel prior to visit, ICD-9:272.0   Prescriptions: LOVASTATIN 40 MG TABS (LOVASTATIN) 1 po qd  #30 x 12   Entered and Authorized by:   Tresa Garter MD   Signed by:   Tresa Garter MD on 05/27/2008   Method used:   Print then Give to Patient   RxID:   [id number] TRIAMTERENE-HCTZ 37.5-25 MG CAPS (TRIAMTERENE-HCTZ) 1 po qam  #30 x 12   Entered and Authorized by:   Tresa Garter MD   Signed by:   Tresa Garter MD on 05/27/2008   Method used:   Print then Give to Patient   RxID:   [id number] AZOR 10-40 MG  TABS  (AMLODIPINE-OLMESARTAN) 1 once daily  #30 x 12   Entered and Authorized by:   Tresa Garter MD   Signed by:   Tresa Garter MD on 05/27/2008   Method used:   Print then Give to Patient   RxID:   [id number]  ]

## 2010-10-15 NOTE — Progress Notes (Signed)
Summary: Nuclear Pre-Procedure  Phone Note Outgoing Call   Call placed by: Milana Na, EMT-P,  August 25, 2010 4:12 PM Summary of Call: Left message with information on Myoview Information Sheet (see scanned document for details).      Nuclear Med Background Indications for Stress Test: Evaluation for Ischemia  Indications Comments: 08/12/10 ED CP (-) enzymes   History: Echo, GXT  History Comments: 2000 GXT NL 11/09 ECHO EF 55-65% H/O PAF RVR   Symptoms: Chest Tightness, Dizziness, Light-Headedness, Palpitations, SOB    Nuclear Pre-Procedure Cardiac Risk Factors: Carotid Disease, Hypertension, Lipids Height (in): 71  Nuclear Med Study Referring MD:  D.McLean

## 2010-10-15 NOTE — Assessment & Plan Note (Signed)
Summary: rov/afib   Visit Type:  Follow-up Primary Provider:  Tresa Garter MD  CC:  Chest tightness x 4 days with (L) leg tightness.  History of Present Illness: Primary Cardiologist:  Dr. Marca Ancona  [name] [name] is a 70 yo male with a h/o parox AFib and HTN who returns for follow up.  He is treated with ASA due to a low CHADS2 score.  He was seen  in his PCP's office 08/10/2010 when he walked in with chest pain.  He was found to be in AFib with RVR.  He was given NTG and he converted to NSR while in the office.  Since he was seen, he had a recurrence of chest discomfort.  He went to the emergency room on November 30.  Cardiac markers were negative.  His EKG demonstrated normal sinus rhythm.  His chest x-ray was normal.  He was asked to followup here today.  He denies exertional chest pain.  His chest discomfort when he had atrial fibrillation was described as a tightness.  His symptoms were similar a couple of years ago when he was first diagnosed with atrial fibrillation.  However, the symptoms were worse.  He felt lightheadedness/dizziness.  He denies syncope or near-syncope.  His symptoms quickly abated when normal sinus rhythm was documented on EKG.  He denies exertional shortness of breath.  He denies orthopnea or PND.  He denies edema.  Current Medications (verified): 1)  Toprol Xl 25 Mg Xr24h-Tab (Metoprolol Succinate) .... One-Half Tablet Daily 2)  Benazepril-Hydrochlorothiazide 20-25 Mg Tabs (Benazepril-Hydrochlorothiazide) .[name].. 1 By Mouth Qd 3)  Simvastatin 40 Mg Tabs (Simvastatin) .[name].. 1 By Mouth Qd 4)  Aspirin 325 Mg Tabs (Aspirin) .[name].. 1 Qd 5)  Amitiza 24 Mcg Caps (Lubiprostone) .[name].. 1 By Mouth Once Daily As Needed Constipation 6)  Triamcinolone Acetonide 0.5 % Crea (Triamcinolone Acetonide) .... Use Two Times A Day Prn  Allergies (verified): No Known Drug Allergies  Past History:  Social History: Last updated: 03/11/2009 The patient lives in [place].  He  is a native of [place].  He is an Art gallery manager at [employer].  He drinks a glass of wine nightly.  He is a nonsmoker.  He is a former Geophysicist/field seismologist.   Past Medical History: Reviewed history from 03/10/2010 and no changes required. 1. Hypertension. 2. Atrial fibrillation.  The patient had new-onset atrial fibrillation in November 2009.  His CHADS2 score is 1.  He underwent ibutilide cardioversion successfully.  He is not on Coumadin currently.  He is on aspirin 325 mg daily. 3. Hypercholesterolemia. 4. Echocardiogram in November 2009, EF was 65%.  There was mild LVH.  There was aortic sclerosis without stenosis (mean gradient 10 mmHg).  There was normal RV size and function.  5. Osteoarthritis. 6. Low back pain.  Review of Systems       As per  the HPI.  All other systems reviewed and negative.   Vital Signs:  Patient profile:   70 year old male Height:      71 inches Weight:      214 pounds BMI:     29.95 Pulse rate:   54 / minute Pulse rhythm:   regular BP sitting:   150 / 80  (left arm) Cuff size:   regular  Vitals Entered By: Stanton Kidney, EMT-P (August 13, 2010 11:10 AM)  Physical Exam  General:  Well nourished, well developed, in no acute distress HEENT: normal Neck: no JVD Endo: no thyromegaly Cardiac:  normal S1, S2; RRR; 2/6 systolic murmur along LSB Lungs:  clear to auscultation bilaterally, no wheezing, rhonchi or rales Abd: soft, nontender, no hepatomegaly Ext: no edema Vascular: no carotid  bruits Skin: warm and dry Neuro:  CNs 2-12 intact, no focal abnormalities noted    EKG  Procedure date:  08/13/2010  Findings:      Sinus Bradycardia Heart rate 54 Normal axis Nonspecific ST-T wave changes  Impression & Recommendations:  Problem # 1:  CHEST PAIN (ICD-786.50) I suspect his chest discomfort is all related to his AFib. However, he has HTN and is 70 yo and has a h/o minimal plaque on carotid dopplers 2 years ago. He also has mild to  moderate AS. I will set him up for a stress myoview to r/o ischemic heart disease.  Orders: Nuclear Stress Test (Nuc Stress Test)  Problem # 2:  ATRIAL FIBRILLATION (ICD-427.31)  TSH: 4.29 (07/22/2010 8:51:48 AM)   I will put him on an event monitor to see how much AFib he is having.  If he is having a lot of AFib and his stress test is normal, he may be a candidate for antiarrhythmic therapy (flecainide).  He will be brought back for f/u with Dr. Shirlee Latch after his stress test and his monitor.  CHADSVASC score is actually 2.  Will need to consider whether or not to switch to coumadin over time. Orders: Event (Event)  Problem # 3:  AORTIC STENOSIS (ICD-424.1)  Mild to moderate with a mean gradient of 18 by echo in 7.2011.  Problem # 4:  HYPERTENSION (ICD-401.9)  BP running high. Would strongly consider adding amlodipine.  He is somewhat hesitant to change any medications.   Would have to decrease simva or change to different statin.  Discussed this with him. Will monitor his BP for now.  Patient Instructions: 1)  Your physician recommends that you schedule a follow-up appointment in:  3 -4 weeks with Dr. Shirlee Latch 2)  Your physician has recommended that you wear an event monitor.  Event monitors are medical devices that record the heart's electrical activity. Doctors most often use these monitors to diagnose arrhythmias. Arrhythmias are problems with the speed or rhythm of the heartbeat. The monitor is a small, portable device. You can wear one while you do your normal daily activities. This is usually used to diagnose what is causing palpitations/syncope (passing out). 3)  Your physician has requested that you have an exercise stress myoview.  For further information please visit [email or web address]  Please follow instruction sheet, as given. HOLD TOPROL XL THE DAY BEFORE AND DAY OF STRESS TEST.

## 2010-10-15 NOTE — Assessment & Plan Note (Signed)
Summary: walk in/elev heart rate/sob/cd   Primary Care Provider:  Tresa Garter MD   History of Present Illness: C/o chest heaviness, SOB, palpitations starting 8 am - worse with exrtion. He felt dizzy at times. He walked in the office and was seen during lunch break.  Allergies: No Known Drug Allergies  Past History:  Past Medical History: Last updated: 03/10/2010 1. Hypertension. 2. Atrial fibrillation.  The patient had new-onset atrial fibrillation in November 2009.  His CHADS2 score is 1.  He underwent ibutilide cardioversion successfully.  He is not on Coumadin currently.  He is on aspirin 325 mg daily. 3. Hypercholesterolemia. 4. Echocardiogram in November 2009, EF was 65%.  There was mild LVH.  There was aortic sclerosis without stenosis (mean gradient 10 mmHg).  There was normal RV size and function.  5. Osteoarthritis. 6. Low back pain.  Past Surgical History: Last updated: 03/10/2009 Back surgery x12 years ago Lumbar laminectomy Hemorrhoidectomy 2010  Family History: Last updated: 08/28/2008 Hypertension. No early CAD.   Social History: Last updated: 03/11/2009 The patient lives in [place].  He is a native of [place].  He is an Art gallery manager at [employer].  He drinks a glass of wine nightly.  He is a nonsmoker.  He is a former Geophysicist/field seismologist.   Review of Systems  The patient denies fever, weight loss, prolonged cough, abdominal pain, melena, muscle weakness, and difficulty walking.    Physical Exam  General:  NAD Head:  Normocephalic and atraumatic without obvious abnormalities. No apparent alopecia or balding. Eyes:  No corneal or conjunctival inflammation noted. EOMI. Perrla. Funduscopic exam benign, without hemorrhages, exudates or papilledema. Vision grossly normal. Nose:  External nasal examination shows no deformity or inflammation. Nasal mucosa are pink and moist without lesions or exudates. Mouth:  Oral mucosa and oropharynx without  lesions or exudates.  Teeth in good repair. Neck:  No deformities, masses, or tenderness noted. Lungs:  Normal respiratory effort, chest expands symmetrically. Lungs are clear to auscultation, no crackles or wheezes. Heart:  His initial exam revealed irreg. irreg rate with HR 100-110 BPM. In the end of the visit he had normal rate and regular rhythm. S1 and S2 normal without gallop, murmur, click, rub or other extra sounds. Abdomen:  Bowel sounds positive,abdomen soft and non-tender without masses, organomegaly or hernias noted. Msk:  No deformity or scoliosis noted of thoracic or lumbar spine.   Pulses:  R and L carotid,radial,femoral,dorsalis pedis and posterior tibial pulses are full and equal bilaterally Neurologic:  No cranial nerve deficits noted. Station and gait are normal. Plantar reflexes are down-going bilaterally. DTRs are symmetrical throughout. Sensory, motor and coordinative functions appear intact. Skin:  Intact without suspicious lesions or rashes   Impression & Recommendations:  Problem # 1:  ATRIAL FIBRILLATION (ICD-427.31) with RVR - symptomatic Assessment Deteriorated It was a new episode of 4-5 hrs duration. He was observed - he converted incidentallly following 2 sprays of SL Nitrospray. Subsequent EKG - NSR Chart/ ECHO report - all reviewed His updated medication list for this problem includes:    Toprol Xl 25 Mg Xr24h-tab (Metoprolol succinate) ..... One-half tablet daily    Aspirin 325 Mg Tabs (Aspirin) .[name]... 1 qd  Orders: Cardiology Referral (Cardiology) EKG w/ Interpretation (93000) EKG w/ Interpretation (93000)  Problem # 2:  CHEST PAIN (ICD-786.50) due to #1 Assessment: New EKG w/some ST changes - not new He is on ASA He declined hospitalization. He left office w/wife feeling nl after 1 h  of observation  Problem # 3:  DYSPNEA (ICD-786.05) due to #1 Assessment: New  Problem # 4:  DIZZINESS (ICD-780.4) due to #1 Assessment: New  Complete Medication  List: 1)  Toprol Xl 25 Mg Xr24h-tab (Metoprolol succinate) .... One-half tablet daily 2)  Benazepril-hydrochlorothiazide 20-25 Mg Tabs (Benazepril-hydrochlorothiazide) .[name].. 1 by mouth qd 3)  Simvastatin 40 Mg Tabs (Simvastatin) .[name].. 1 by mouth qd 4)  Vitamin D3 1000 Unit Tabs (Cholecalciferol) .[name].. 1 qd 5)  Aspirin 325 Mg Tabs (Aspirin) .[name].. 1 qd 6)  Amitiza 24 Mcg Caps (Lubiprostone) .[name].. 1 by mouth once daily as needed constipation 7)  Triamcinolone Acetonide 0.5 % Crea (Triamcinolone acetonide) .... Use two times a day prn  Patient Instructions: 1)  Call if you are not better in a reasonable amount of time or if worse. Go to ER if A fib comes back   Orders Added: 1)  Cardiology Referral [Cardiology] 2)  Est. Patient Level V [04540] 3)  EKG w/ Interpretation [93000] 4)  EKG w/ Interpretation [93000]

## 2010-10-15 NOTE — Letter (Signed)
Summary: Primary Care Appointment Letter  [facility]  [address] [place], [place] [address]   Phone: [phone]  Fax: [phone]    08/01/2008 MRN: [id number]  [facility] [address] [place], [place]  [address]  Dear [name],   Your Primary Care Physician Tresa Garter MD has indicated that:    _______it is time to schedule an appointment.    _______you missed your appointment on______ and need to call and          reschedule.    _______you need to have lab work done.    _______you need to schedule an appointment discuss lab or test results.    ___X____you need to call to reschedule your appointment that is scheduled on November 22, 2008 with Dr. Posey Rea.  Please call the office.     Please call our office as soon as possible. Our phone number is [phone]. Please press option 1. Our office is open 8a-12noon and 1p-5p, Monday through Friday.     Thank you,     Primary Care Scheduler

## 2010-10-15 NOTE — Assessment & Plan Note (Signed)
Summary: 2 mos f/u  $50  cd   Vital Signs:  Patient Profile:   70 Years Old Male Weight:      213 pounds Temp:     96.3 degrees F oral Pulse rate:   64 / minute BP sitting:   134 / 76  (left arm)  Vitals Entered By: Tora Perches (October 07, 2008 4:12 PM)                 Chief Complaint:  Multiple medical problems or concerns.  History of Present Illness: The patient presents for a follow up of hypertension, CAD, hyperlipidemia     Prior Medications Reviewed Using: Patient Recall  Updated Prior Medication List: TRIAMTERENE-HCTZ 37.5-25 MG CAPS (TRIAMTERENE-HCTZ) 1 po qam ASPIRIN 325 MG TABS (ASPIRIN) 1 qd VITAMIN D3 1000 UNIT  TABS (CHOLECALCIFEROL) 1 qd TOPROL XL 25 MG XR24H-TAB (METOPROLOL SUCCINATE) once daily LISINOPRIL 5 MG TABS (LISINOPRIL) once daily    Past Medical History:    Reviewed history from 08/28/2008 and no changes required:       1. Hypertension.       2. Atrial fibrillation.  The patient had new-onset atrial fibrillation           in November 2009.  His CHADS2 score is 1.  He underwent ibutilide cardioversion successfully.  He is not on Coumadin currently.           He is on aspirin 325 mg daily.       3. Hypercholesterolemia.       4. Echocardiogram in November 2009, EF was 65%.  There was mild LVH.           There was aortic sclerosis without stenosis.  There was normal RV           size and function.        5. Osteoarthritis.       6. Low back pain.   Family History:    Reviewed history from 08/28/2008 and no changes required:       Hypertension. No early CAD.   Social History:    Reviewed history from 08/28/2008 and no changes required:       The patient lives in [place].  He is a native of       [place].  He is an Art gallery manager at [employer].  He drinks a glass of wine       nightly.  He is a nonsmoker.  He is a prior Geophysicist/field seismologist.            Review of Systems  The patient denies fever, chest pain, dyspnea on  exertion, and abdominal pain.     Physical Exam  General:     Well-developed,well-nourished,in no acute distress; alert,appropriate and cooperative throughout examination Neck:     No deformities, masses, or tenderness noted. Lungs:     Normal respiratory effort, chest expands symmetrically. Lungs are clear to auscultation, no crackles or wheezes. Heart:     Normal rate and regular rhythm. S1 and S2 normal without gallop, murmur, click, rub or other extra sounds. Abdomen:     Bowel sounds positive,abdomen soft and non-tender without masses, organomegaly or hernias noted. Msk:     No deformity or scoliosis noted of thoracic or lumbar spine.   Extremities:     No clubbing, cyanosis, edema, or deformity noted with normal full range of motion of all joints.   Neurologic:     No cranial nerve deficits noted.  Station and gait are normal. Plantar reflexes are down-going bilaterally. DTRs are symmetrical throughout. Sensory, motor and coordinative functions appear intact. Skin:     Intact without suspicious lesions or rashes Psych:     Cognition and judgment appear intact. Alert and cooperative with normal attention span and concentration. No apparent delusions, illusions, hallucinations    Impression & Recommendations:  Problem # 1:  HYPERTENSION (ICD-401.9) Assessment: Unchanged  The following medications were removed from the medication list:    Triamterene-hctz 37.5-25 Mg Caps (Triamterene-hctz) .[name]... 1 po qam    Diltiazem Hcl Cr 240 Mg Cp24 (Diltiazem hcl) .[name]... 1 by mouth daily  His updated medication list for this problem includes:    Toprol Xl 25 Mg Xr24h-tab (Metoprolol succinate) .[name]... 1 once daily    Benazepril-hydrochlorothiazide 20-25 Mg Tabs (Benazepril-hydrochlorothiazide) .[name]... 1 by mouth qd   Problem # 2:  HYPERLIPIDEMIA (ICD-272.4) Assessment: Unchanged  The following medications were removed from the medication list:    Simvastatin 40 Mg Tabs (Simvastatin)  .[name]... Take one tablet at bedtime  His updated medication list for this problem includes:    Simvastatin 40 Mg Tabs (Simvastatin) .[name]... 1 by mouth qd   Problem # 3:  LOW BACK PAIN, CHRONIC (ICD-724.2) Assessment: Improved  His updated medication list for this problem includes:    Aspirin 325 Mg Tabs (Aspirin) .[name]... 1 qd   Problem # 4:  ATRIAL FIBRILLATION (ICD-427.31) Assessment: Comment Only  His updated medication list for this problem includes:    Toprol Xl 25 Mg Xr24h-tab (Metoprolol succinate) .[name]... 1 once daily    Aspirin 325 Mg Tabs (Aspirin) .[name]... 1 qd   Complete Medication List: 1)  Toprol Xl 25 Mg Xr24h-tab (Metoprolol succinate) .[name].. 1 once daily 2)  Benazepril-hydrochlorothiazide 20-25 Mg Tabs (Benazepril-hydrochlorothiazide) .[name].. 1 by mouth qd 3)  Simvastatin 40 Mg Tabs (Simvastatin) .[name].. 1 by mouth qd 4)  Vitamin D3 1000 Unit Tabs (Cholecalciferol) .[name].. 1 qd 5)  Aspirin 325 Mg Tabs (Aspirin) .[name].. 1 qd   Patient Instructions: 1)  Please schedule a follow-up appointment in 4 months. 2)  BMP prior to visit, ICD-9: 3)  Lipid Panel prior to visit, ICD-9: 4)  TSH prior to visit, ICD-9: 995.20 401.1   Prescriptions: SIMVASTATIN 40 MG TABS (SIMVASTATIN) 1 by mouth qd  #90 x 3   Entered and Authorized by:   Tresa Garter MD   Signed by:   Tresa Garter MD on 10/07/2008   Method used:   Print then Give to Patient   RxID:   [id number] TOPROL XL 25 MG XR24H-TAB (METOPROLOL SUCCINATE) 1 once daily  #90 x 3   Entered and Authorized by:   Tresa Garter MD   Signed by:   Tresa Garter MD on 10/07/2008   Method used:   Print then Give to Patient   RxID:   [id number] BENAZEPRIL-HYDROCHLOROTHIAZIDE 20-25 MG TABS (BENAZEPRIL-HYDROCHLOROTHIAZIDE) 1 by mouth qd  #90 x 3   Entered and Authorized by:   Tresa Garter MD   Signed by:   Tresa Garter MD on 10/07/2008   Method used:   Print then Give to Patient   RxID:    [id number]   Appended Document: Orders Update     Clinical Lists Changes  Orders: Added new Service order of Est. Patient Level IV (01027) - Signed

## 2010-10-15 NOTE — Assessment & Plan Note (Signed)
Summary: POST HOSP PER DR LESCHBER/$50 Natale Milch   Vital Signs:  Patient Profile:   70 Years Old Male Weight:      215 pounds Temp:     97.6 degrees F oral Pulse rate:   68 / minute BP sitting:   148 / 84  (left arm)  Vitals Entered By: Tora Perches (August 05, 2008 3:54 PM)                 Chief Complaint:  post hosp.  History of Present Illness: The patient presents for a post-hospital visit for A fib, SOB, elev chol.     Current Allergies (reviewed today): ! SULFA CODEINE  Past Medical History:    Reviewed history from 08/14/2007 and no changes required:       Hyperlipidemia       Hypertension       Osteoarthritis       Low back pain       Atrial fibrillation 11/09  Dr Shirlee Latch   Family History:    Reviewed history from 11/23/2007 and no changes required:       Family History Hypertension  Social History:    Reviewed history from 08/14/2007 and no changes required:       Occupation: Nurse, children's       Married       Never Smoked    Review of Systems  The patient denies anorexia, chest pain, prolonged cough, and abdominal pain.         No SOB   Physical Exam  General:     Well-developed,well-nourished,in no acute distress; alert,appropriate and cooperative throughout examination Eyes:     No corneal or conjunctival inflammation noted. EOMI. Perrla. Funduscopic exam benign, without hemorrhages, exudates or papilledema. Vision grossly normal. Nose:     External nasal examination shows no deformity or inflammation. Nasal mucosa are pink and moist without lesions or exudates. Mouth:     Oral mucosa and oropharynx without lesions or exudates.  Teeth in good repair. Neck:     No deformities, masses, or tenderness noted. Lungs:     Normal respiratory effort, chest expands symmetrically. Lungs are clear to auscultation, no crackles or wheezes. Heart:     Normal rate and regular rhythm. S1 and S2 normal without gallop, murmur, click, rub or other extra  sounds. Abdomen:     Bowel sounds positive,abdomen soft and non-tender without masses, organomegaly or hernias noted.    Impression & Recommendations:  Problem # 1:  ATRIAL FIBRILLATION (ICD-427.31), in NSR now Assessment: Improved  His updated medication list for this problem includes:    Aspirin 325 Mg Tabs (Aspirin) .[name]... 1 qd   Problem # 2:  HYPERTENSION (ICD-401.9) Assessment: Comment Only  The following medications were removed from the medication list:    Azor 10-40 Mg Tabs (Amlodipine-olmesartan) .[name]... 1 once daily  His updated medication list for this problem includes:    Triamterene-hctz 37.5-25 Mg Caps (Triamterene-hctz) .[name]... 1 po qam    Diltiazem Hcl Cr 240 Mg Cp24 (Diltiazem hcl) .[name]... 1 by mouth daily   Problem # 3:  OTHER ABNORMAL GLUCOSE (ICD-790.29)  Problem # 4:  HYPERLIPIDEMIA (ICD-272.4) Assessment: Comment Only  The following medications were removed from the medication list:    Lovastatin 40 Mg Tabs (Lovastatin) .[name]... 1 po qd  His updated medication list for this problem includes:    Simvastatin 40 Mg Tabs (Simvastatin) .[name]... Take one tablet at bedtime   Problem # 5:  Abn CK and  TSH - mild  Will repeat The labs were reviewd with the patient.   Complete Medication List: 1)  Triamterene-hctz 37.5-25 Mg Caps (Triamterene-hctz) .[name].. 1 po qam 2)  Simvastatin 40 Mg Tabs (Simvastatin) .... Take one tablet at bedtime 3)  Diltiazem Hcl Cr 240 Mg Cp24 (Diltiazem hcl) .[name].. 1 by mouth daily 4)  Aspirin 325 Mg Tabs (Aspirin) .[name].. 1 qd 5)  Vitamin D3 1000 Unit Tabs (Cholecalciferol) .[name].. 1 qd   Patient Instructions: 1)  Please schedule a follow-up appointment in 2 months. 2)  BMP prior to visit, ICD-9: 3)  Hepatic Panel prior to visit, ICD-9: 4)  Lipid Panel prior to visit, ICD-9: 5)  TSH prior to visit, ICD-9: 6)  CBC w/ Diff prior to visit, ICD-9: 272.0 995.20   Prescriptions: DILTIAZEM HCL CR 240 MG CP24 (DILTIAZEM HCL) 1 by mouth daily  #90 x  3   Entered and Authorized by:   Tresa Garter MD   Signed by:   Tresa Garter MD on 08/05/2008   Method used:   Print then Give to Patient   RxID:   [id number] SIMVASTATIN 40 MG TABS (SIMVASTATIN) Take one tablet at bedtime  #90 x 3   Entered and Authorized by:   Tresa Garter MD   Signed by:   Tresa Garter MD on 08/05/2008   Method used:   Print then Give to Patient   RxID:   [id number]  ]

## 2010-10-15 NOTE — Assessment & Plan Note (Signed)
Summary: 3 MO ROV/NWS   Vital Signs:  Patient Profile:   70 Years Old Male Weight:      213 pounds Temp:     97.2 degrees F oral Pulse rate:   72 / minute BP sitting:   140 / 78  (left arm)  Vitals Entered By: Tora Perches (November 23, 2007 8:09 AM)             Is Patient Diabetic? No     Chief Complaint:  Multiple medical problems or concerns.  History of Present Illness: The patient presents for a follow up of hypertension, OA, hyperlipidemia     Current Allergies (reviewed today): ! SULFA CODEINE  Past Medical History:    Reviewed history from 08/14/2007 and no changes required:       Hyperlipidemia       Hypertension       Osteoarthritis       Low back pain   Family History:    Reviewed history and no changes required:       Family History Hypertension  Social History:    Reviewed history from 08/14/2007 and no changes required:       Occupation: Nurse, children's       Married       Never Smoked     Physical Exam  General:     Well-developed,well-nourished,in no acute distress; alert,appropriate and cooperative throughout examination Nose:     External nasal examination shows no deformity or inflammation. Nasal mucosa are pink and moist without lesions or exudates. Mouth:     Oral mucosa and oropharynx without lesions or exudates.  Teeth in good repair. Neck:     No deformities, masses, or tenderness noted. Lungs:     Normal respiratory effort, chest expands symmetrically. Lungs are clear to auscultation, no crackles or wheezes. Heart:     Normal rate and regular rhythm. S1 and S2 normal without gallop, murmur, click, rub or other extra sounds. Abdomen:     Bowel sounds positive,abdomen soft and non-tender without masses, organomegaly or hernias noted. Msk:     No deformity or scoliosis noted of thoracic or lumbar spine.   Neurologic:     No cranial nerve deficits noted. Station and gait are normal. Plantar reflexes are down-going bilaterally. DTRs  are symmetrical throughout. Sensory, motor and coordinative functions appear intact. Skin:     Intact without suspicious lesions or rashes Psych:     Cognition and judgment appear intact. Alert and cooperative with normal attention span and concentration. No apparent delusions, illusions, hallucinations    Impression & Recommendations:  Problem # 1:  HYPERTENSION (ICD-401.9) Assessment: Improved  His updated medication list for this problem includes:    Azor 10-40 Mg Tabs (Amlodipine-olmesartan) .[name]... 1 once daily    Triamterene-hctz 37.5-25 Mg Caps (Triamterene-hctz) .[name]... 1 po qam   Problem # 2:  HYPERLIPIDEMIA (ICD-272.4) Assessment: Unchanged  His updated medication list for this problem includes:    Lovastatin 40 Mg Tabs (Lovastatin) .[name]... 1 po once daily not taking. Risks of noncompliance with treatment discussed. Compliance encouraged.   His updated medication list for this problem includes:    Lovastatin 40 Mg Tabs (Lovastatin) .[name]... 1 po qd   Problem # 3:  OSTEOARTHRITIS (ICD-715.90) Assessment: Unchanged  His updated medication list for this problem includes:    Aspirin 325 Mg Tabs (Aspirin) .[name]... 1 qd   Problem # 4:  CAROTID STENOSIS (ICD-433.10) On Rx His updated medication list for this problem includes:  Aspirin 325 Mg Tabs (Aspirin) .[name]... 1 qd   Problem # 5:  TINEA CRURIS (ICD-110.3) Oxystat topical Rx  Complete Medication List: 1)  Azor 10-40 Mg Tabs (Amlodipine-olmesartan) .[name].. 1 once daily 2)  Triamterene-hctz 37.5-25 Mg Caps (Triamterene-hctz) .[name].. 1 po qam 3)  Lovastatin 40 Mg Tabs (Lovastatin) .[name].. 1 po qd 4)  Aspirin 325 Mg Tabs (Aspirin) .[name].. 1 qd 5)  Vitamin D3 1000 Unit Tabs (Cholecalciferol) .[name].. 1 qd 6)  Oxistat 1 % Crea (Oxiconazole nitrate) .... Use two times a day x 1 mo   Patient Instructions: 1)  Please schedule a follow-up appointment in 6 months, wellness. 2)  BMP prior to visit, ICD-9: 3)  Hepatic Panel prior to visit,  ICD-9: 4)  Lipid Panel prior to visit, ICD-9: v70.0 5)  TSH prior to visit, ICD-9: 6)  CBC w/ Diff prior to visit, ICD-9: 7)  Urine-dip prior to visit, ICD-9:272.0 8)  PSA prior to visit, ICD-9:    Prescriptions: OXISTAT 1 %  CREA (OXICONAZOLE NITRATE) use two times a day x 1 mo  #45g x 2   Entered and Authorized by:   Tresa Garter MD   Signed by:   Tresa Garter MD on 11/23/2007   Method used:   Print then Give to Patient   RxID:   [id number]  ]

## 2010-10-15 NOTE — Assessment & Plan Note (Signed)
Summary: E4V   Primary Provider:  Georgina Quint [name] MD  CC:  f1y/pt has concerns over medication being too strong.  Pt is not in favor of medication anyway.  History of Present Illness: 70 yo with history of HTN and paroxysmal atrial fibrillation returns for followup of his atrial fib.  Patient is in sinus rhythm today and has noted no episodes of racing heart rate or irregular heart rate.  He has been doing well in general.  He is walking for exercise (though he is limited somewhat by his low back pain).  He denies any shortness of breath or chest pain with exertion.   BP is elevated today, but on the patient's home cuff, SBP has been averaging in the 110s-120s.  It is always < 140 at home.  He is upset as he recently heard that a cousin in [place] died and thinks that this may have raised his BP.  He also thinks that his pulse is too low (running in the 50s at home).  He does not, however, report any lightheadedness or fatigue.    ECG:  NSR at 58, normal  Labs (6/10): LDL 85, HDL 37, TSH normal, creatinine 0.9, K 4.6 Labs (4/11): LDL 76, HDL 37.5, K 3.9, creatinine 3.9, TSH normal.    Current Medications (verified): 1)  Toprol Xl 25 Mg Xr24h-Tab (Metoprolol Succinate) .[name].. 1 Once Daily 2)  Benazepril-Hydrochlorothiazide 20-25 Mg Tabs (Benazepril-Hydrochlorothiazide) .[name].. 1 By Mouth Qd 3)  Simvastatin 40 Mg Tabs (Simvastatin) .[name].. 1 By Mouth Qd 4)  Vitamin D3 1000 Unit  Tabs (Cholecalciferol) .[name].. 1 Qd 5)  Aspirin 325 Mg Tabs (Aspirin) .[name].. 1 Qd  Allergies (verified): No Known Drug Allergies  Past History:  Past Surgical History: Last updated: 03/10/2009 Back surgery x12 years ago Lumbar laminectomy Hemorrhoidectomy 2010  Family History: Last updated: 08/28/2008 Hypertension. No early CAD.   Social History: Last updated: 03/11/2009 The patient lives in [place].  He is a native of [place].  He is an Art gallery manager at [employer].  He drinks a glass of wine nightly.  He is a  nonsmoker.  He is a former Geophysicist/field seismologist.   Past Medical History: 1. Hypertension. 2. Atrial fibrillation.  The patient had new-onset atrial fibrillation in November 2009.  His CHADS2 score is 1.  He underwent ibutilide cardioversion successfully.  He is not on Coumadin currently.  He is on aspirin 325 mg daily. 3. Hypercholesterolemia. 4. Echocardiogram in November 2009, EF was 65%.  There was mild LVH.  There was aortic sclerosis without stenosis (mean gradient 10 mmHg).  There was normal RV size and function.  5. Osteoarthritis. 6. Low back pain.   Family History: Reviewed history from 08/28/2008 and no changes required. Hypertension. No early CAD.   Social History: Reviewed history from 03/11/2009 and no changes required. The patient lives in [place].  He is a native of [place].  He is an Art gallery manager at [employer].  He drinks a glass of wine nightly.  He is a nonsmoker.  He is a former Geophysicist/field seismologist.   Review of Systems       All systems reviewed and negative except as per HPI.   Vital Signs:  Patient profile:   70 year old male Height:      73 inches Weight:      217 pounds BMI:     28.73 Pulse rate:   58 / minute Pulse rhythm:   regular BP sitting:   152 / 78  (  left arm) Cuff size:   large  Vitals Entered By: Judithe Modest CMA (March 10, 2010 4:40 PM)  Physical Exam  General:  Well developed, well nourished, in no acute distress. Neck:  Neck supple, no JVD. No masses, thyromegaly or abnormal cervical nodes. Lungs:  Clear bilaterally to auscultation and percussion. Heart:  Non-displaced PMI, chest non-tender; regular rate and rhythm, S1, S2 without rubs or gallops. 2/6 systolic ejection murmur RUSB.  Carotid upstroke normal, no bruit.  Pedals normal pulses. No edema, no varicosities. Abdomen:  Bowel sounds positive; abdomen soft and non-tender without masses, organomegaly, or hernias noted. No hepatosplenomegaly. Extremities:  No clubbing or  cyanosis. Neurologic:  Alert and oriented x 3. Psych:  Normal affect.   Impression & Recommendations:  Problem # 1:  AORTIC STENOSIS (ICD-424.1) Minimal on echo in 2009.  Murmur seems a bit louder, will repeat study this year.   Problem # 2:  ATRIAL FIBRILLATION (ICD-427.31) No symptomatic atrial fibrillation and patient in NSR today.  Continue ASA and Toprol XL.  He will cut Toprol XL in half to 12.5 mg daily given low heart rate.   Problem # 3:  HYPERTENSION (ICD-401.9) BP has been at goal at home.  Continue benazepril/HCT.   Other Orders: Echocardiogram (Echo)  Patient Instructions: 1)  Your physician has recommended you make the following change in your medication:  2)  Decrease Toprol XL 25mg  to one-half daily--this will be 12.5mg  daily 3)  Your physician has requested that you have an echocardiogram.  Echocardiography is a painless test that uses sound waves to create images of your heart. It provides your doctor with information about the size and shape of your heart and how well your heart's chambers and valves are working.  This procedure takes approximately one hour. There are no restrictions for this procedure. 4)  Your physician wants you to follow-up in:  1 year with Dr Shirlee Latch. You will receive a reminder letter in the mail two months in advance. If you don't receive a letter, please call our office to schedule the follow-up appointment.

## 2010-10-15 NOTE — Assessment & Plan Note (Signed)
Summary: F/U APPT/CD   Vital Signs:  Patient profile:   70 year old male Weight:      214 pounds Temp:     97.6 degrees F oral Pulse rate:   59 / minute BP sitting:   130 / 70  (left arm)  Vitals Entered By: Tora Perches (June 30, 2009 3:14 PM) CC: f/u Is Patient Diabetic? No   Primary Care Provider:  Tresa Garter MD  CC:  f/u.  History of Present Illness: The patient presents for a follow up of hypertension, A fib, hyperlipidemia. C/o cyst on R post neck   Preventive Screening-Counseling & Management  Alcohol-Tobacco     Smoking Status: quit  Current Medications (verified): 1)  Toprol Xl 25 Mg Xr24h-Tab (Metoprolol Succinate) .[name].. 1 Once Daily 2)  Benazepril-Hydrochlorothiazide 20-25 Mg Tabs (Benazepril-Hydrochlorothiazide) .[name].. 1 By Mouth Qd 3)  Simvastatin 40 Mg Tabs (Simvastatin) .[name].. 1 By Mouth Qd 4)  Vitamin D3 1000 Unit  Tabs (Cholecalciferol) .[name].. 1 Qd 5)  Aspirin 325 Mg Tabs (Aspirin) .[name].. 1 Qd  Allergies (verified): No Known Drug Allergies  Past History:  Past Medical History: Last updated: 03/11/2009 1. Hypertension. 2. Atrial fibrillation.  The patient had new-onset atrial fibrillation     in November 2009.  His CHADS2 score is 1.  He underwent ibutilide cardioversion successfully.  He is not on       Coumadin currently.  He is on aspirin 325 mg daily. 3. Hypercholesterolemia. 4. Echocardiogram in November 2009, EF was 65%.  There was mild LVH.     There was aortic sclerosis without stenosis (mean gradient 10 mmHg).  There was normal RV     size and function.  5. Osteoarthritis. 6. Low back pain.  Social History: Last updated: 03/11/2009 The patient lives in [place].  He is a native of [place].  He is an Art gallery manager at [employer].  He drinks a glass of wine nightly.  He is a nonsmoker.  He is a former Geophysicist/field seismologist.   Social History: Smoking Status:  quit  Physical Exam  General:  NAD Nose:  External nasal examination  shows no deformity or inflammation. Nasal mucosa are pink and moist without lesions or exudates. Mouth:  Oral mucosa and oropharynx without lesions or exudates.  Teeth in good repair. Neck:  No deformities, masses, or tenderness noted. Lungs:  Normal respiratory effort, chest expands symmetrically. Lungs are clear to auscultation, no crackles or wheezes. Heart:  RRR Abdomen:  Bowel sounds positive,abdomen soft and non-tender without masses, organomegaly or hernias noted. Msk:  No deformity or scoliosis noted of thoracic or lumbar spine.   Skin:  6 mm R post neck cyst Psych:  Cognition and judgment appear intact. Alert and cooperative with normal attention span and concentration. No apparent delusions, illusions, hallucinations   Impression & Recommendations:  Problem # 1:  ATRIAL FIBRILLATION (ICD-427.31) Assessment Unchanged  His updated medication list for this problem includes:    Toprol Xl 25 Mg Xr24h-tab (Metoprolol succinate) .[name]... 1 once daily    Aspirin 325 Mg Tabs (Aspirin) .[name]... 1 qd  Problem # 2:  Cyst on R poster neck Bx w/me  Problem # 3:  HYPERTENSION (ICD-401.9) Assessment: Improved  His updated medication list for this problem includes:    Toprol Xl 25 Mg Xr24h-tab (Metoprolol succinate) .[name]... 1 once daily    Benazepril-hydrochlorothiazide 20-25 Mg Tabs (Benazepril-hydrochlorothiazide) .[name]... 1 by mouth qd  Problem # 4:  HYPERLIPIDEMIA (ICD-272.4) Assessment: Comment Only  His  updated medication list for this problem includes:    Simvastatin 40 Mg Tabs (Simvastatin) .[name]... 1 by mouth qd  Complete Medication List: 1)  Toprol Xl 25 Mg Xr24h-tab (Metoprolol succinate) .[name].. 1 once daily 2)  Benazepril-hydrochlorothiazide 20-25 Mg Tabs (Benazepril-hydrochlorothiazide) .[name].. 1 by mouth qd 3)  Simvastatin 40 Mg Tabs (Simvastatin) .[name].. 1 by mouth qd 4)  Vitamin D3 1000 Unit Tabs (Cholecalciferol) .[name].. 1 qd 5)  Aspirin 325 Mg Tabs (Aspirin) .[name].. 1 qd  Patient  Instructions: 1)  Try to eat more raw plant food, fresh and dry fruit, raw almonds, leafy vegetables, whole foods and less red meat, less animal fat. Poultry and fish is better for you than pork and beef. Avoid processed foods (canned soups, hot dogs, sausage, bacon , frozen dinners). Avoid corn syrup, high fructose syrup or aspartam and Splenda  containing drinks. Honey, Agave and Stevia are better sweeteners. Make your own  dressing with olive oil, wine vinegar, lemon juce, garlic etc. for your salads. 2)  Please schedule a follow-up appointment in 6 months well w/labs. 3)  Skin bx w/me Prescriptions: SIMVASTATIN 40 MG TABS (SIMVASTATIN) 1 by mouth qd  #90 x 3   Entered and Authorized by:   Tresa Garter MD   Signed by:   Tresa Garter MD on 06/30/2009   Method used:   Print then Give to Patient   RxID:   [id number] BENAZEPRIL-HYDROCHLOROTHIAZIDE 20-25 MG TABS (BENAZEPRIL-HYDROCHLOROTHIAZIDE) 1 by mouth qd  #90 x 3   Entered and Authorized by:   Tresa Garter MD   Signed by:   Tresa Garter MD on 06/30/2009   Method used:   Print then Give to Patient   RxID:   [id number] TOPROL XL 25 MG XR24H-TAB (METOPROLOL SUCCINATE) 1 once daily  #90 x 3   Entered and Authorized by:   Tresa Garter MD   Signed by:   Tresa Garter MD on 06/30/2009   Method used:   Print then Give to Patient   RxID:   [id number]

## 2010-10-15 NOTE — Assessment & Plan Note (Signed)
Summary: 4 MO ROV /NWS $50   Vital Signs:  Patient profile:   70 year old male Height:      71 inches Weight:      213 pounds BMI:     29.81 Temp:     96.9 degrees F oral Pulse rate:   57 / minute BP sitting:   126 / 84  Vitals Entered By: Tora Perches (March 10, 2009 10:08 AM) CC: f/u Is Patient Diabetic? No   CC:  f/u.  History of Present Illness: The patient presents for a follow up of hypertension, diabetes, hyperlipidemia, hemorrhoids   Current Medications (verified): 1)  Toprol Xl 25 Mg Xr24h-Tab (Metoprolol Succinate) .[name].. 1 Once Daily 2)  Benazepril-Hydrochlorothiazide 20-25 Mg Tabs (Benazepril-Hydrochlorothiazide) .[name].. 1 By Mouth Qd 3)  Simvastatin 40 Mg Tabs (Simvastatin) .[name].. 1 By Mouth Qd 4)  Vitamin D3 1000 Unit  Tabs (Cholecalciferol) .[name].. 1 Qd 5)  Aspirin 325 Mg Tabs (Aspirin) .[name].. 1 Qd 6)  Triamcinolone Acetonide 0.1 % Oint (Triamcinolone Acetonide) .... Use 2-4 Times A Day 7)  Anusol-Hc 25 Mg Supp (Hydrocortisone Acetate) .[name].. 1 Pr Two Times A Day X 7-10 D 8)  Metamucil 0.52 Gm Caps (Psyllium) .... 2 Caps Bid  Allergies (verified): No Known Drug Allergies  Past History:  Past Medical History: Last updated: 08/28/2008 1. Hypertension. 2. Atrial fibrillation.  The patient had new-onset atrial fibrillation     in November 2009.  His CHADS2 score is 1.  He underwent ibutilide cardioversion successfully.  He is not on Coumadin currently.     He is on aspirin 325 mg daily. 3. Hypercholesterolemia. 4. Echocardiogram in November 2009, EF was 65%.  There was mild LVH.     There was aortic sclerosis without stenosis.  There was normal RV     size and function.  5. Osteoarthritis. 6. Low back pain.  Family History: Last updated: 08/28/2008 Hypertension. No early CAD.   Social History: Last updated: 08/28/2008 The patient lives in [place].  He is a native of [place].  He is an Art gallery manager at [employer].  He drinks a glass of wine nightly.  He is a  nonsmoker.  He is a prior Geophysicist/field seismologist.   Past Surgical History: Back surgery x12 years ago Lumbar laminectomy Hemorrhoidectomy 2010  Review of Systems  The patient denies chest pain, dyspnea on exertion, prolonged cough, abdominal pain, melena, and hematochezia.    Physical Exam  General:  NAD Ears:  External ear exam shows no significant lesions or deformities.  Otoscopic examination reveals clear canals, tympanic membranes are intact bilaterally without bulging, retraction, inflammation or discharge. Hearing is grossly normal bilaterally. Nose:  External nasal examination shows no deformity or inflammation. Nasal mucosa are pink and moist without lesions or exudates. Mouth:  Oral mucosa and oropharynx without lesions or exudates.  Teeth in good repair. Neck:  No deformities, masses, or tenderness noted. Lungs:  Normal respiratory effort, chest expands symmetrically. Lungs are clear to auscultation, no crackles or wheezes. Heart:  Normal rate and regular rhythm. S1 and S2 normal without gallop, murmur, click, rub or other extra sounds. Abdomen:  Bowel sounds positive,abdomen soft and non-tender without masses, organomegaly or hernias noted. Msk:  No deformity or scoliosis noted of thoracic or lumbar spine.   Neurologic:  No cranial nerve deficits noted. Station and gait are normal. Plantar reflexes are down-going bilaterally. DTRs are symmetrical throughout. Sensory, motor and coordinative functions appear intact. Skin:  Intact without suspicious lesions or rashes  Psych:  Cognition and judgment appear intact. Alert and cooperative with normal attention span and concentration. No apparent delusions, illusions, hallucinations   Impression & Recommendations:  Problem # 1:  HEMORRHOIDS, NOS (ICD-455.6) Assessment Improved  Problem # 2:  ANAL OR RECTAL PAIN (EAV-409.81) Assessment: Improved  Problem # 3:  HYPERTENSION (ICD-401.9) Assessment: Improved  His updated  medication list for this problem includes:    Toprol Xl 25 Mg Xr24h-tab (Metoprolol succinate) .[name]... 1 once daily    Benazepril-hydrochlorothiazide 20-25 Mg Tabs (Benazepril-hydrochlorothiazide) .[name]... 1 by mouth qd  Problem # 4:  HYPERLIPIDEMIA (ICD-272.4) Assessment: Comment Only  His updated medication list for this problem includes:    Simvastatin 40 Mg Tabs (Simvastatin) .[name]... 1 by mouth qd  Problem # 5:  LOW BACK PAIN (ICD-724.2) Assessment: Improved  His updated medication list for this problem includes:    Aspirin 325 Mg Tabs (Aspirin) .[name]... 1 qd  Complete Medication List: 1)  Toprol Xl 25 Mg Xr24h-tab (Metoprolol succinate) .[name].. 1 once daily 2)  Benazepril-hydrochlorothiazide 20-25 Mg Tabs (Benazepril-hydrochlorothiazide) .[name].. 1 by mouth qd 3)  Simvastatin 40 Mg Tabs (Simvastatin) .[name].. 1 by mouth qd 4)  Vitamin D3 1000 Unit Tabs (Cholecalciferol) .[name].. 1 qd 5)  Aspirin 325 Mg Tabs (Aspirin) .[name].. 1 qd 6)  Triamcinolone Acetonide 0.1 % Oint (Triamcinolone acetonide) .... Use 2-4 times a day 7)  Anusol-hc 25 Mg Supp (Hydrocortisone acetate) .[name].. 1 pr two times a day x 7-10 d 8)  Metamucil 0.52 Gm Caps (Psyllium) .... 2 caps bid  Patient Instructions: 1)  Please schedule a follow-up appointment in 4 months. 2)  Try to eat more raw plant food, dry fruit, raw almonds, leafy vegies, whole foods and less meat, animal fat. Avoid processed foods, (canned soups, hot dogs, sausage , frozen dinners), animal fat, red meat.  3)  [email or web address] 4)  Start a yoga class

## 2010-10-15 NOTE — Assessment & Plan Note (Signed)
Summary: RECTUM PAIN  $50   STC   Vital Signs:  Patient Profile:   70 Years Old Male Weight:      221 pounds Temp:     96.8 degrees F oral Pulse rate:   68 / minute BP sitting:   134 / 84  (left arm)  Vitals Entered By: Tora Perches (October 29, 2008 4:00 PM)                 Chief Complaint:  Multiple medical problems or concerns.  History of Present Illness: C/o rectal pain x 2 wks, bad all the time Normal  BM,  daily or every other day  Had a colon < 1 y    Prior Medications Reviewed Using: Patient Recall  Prior Medication List:  TOPROL XL 25 MG XR24H-TAB (METOPROLOL SUCCINATE) 1 once daily BENAZEPRIL-HYDROCHLOROTHIAZIDE 20-25 MG TABS (BENAZEPRIL-HYDROCHLOROTHIAZIDE) 1 by mouth qd SIMVASTATIN 40 MG TABS (SIMVASTATIN) 1 by mouth qd VITAMIN D3 1000 UNIT  TABS (CHOLECALCIFEROL) 1 qd ASPIRIN 325 MG TABS (ASPIRIN) 1 qd   Current Allergies: No known allergies   Past Medical History:    Reviewed history from 08/28/2008 and no changes required:       1. Hypertension.       2. Atrial fibrillation.  The patient had new-onset atrial fibrillation           in November 2009.  His CHADS2 score is 1.  He underwent ibutilide cardioversion successfully.  He is not on Coumadin currently.           He is on aspirin 325 mg daily.       3. Hypercholesterolemia.       4. Echocardiogram in November 2009, EF was 65%.  There was mild LVH.           There was aortic sclerosis without stenosis.  There was normal RV           size and function.        5. Osteoarthritis.       6. Low back pain.   Family History:    Reviewed history from 08/28/2008 and no changes required:       Hypertension. No early CAD.   Social History:    Reviewed history from 08/28/2008 and no changes required:       The patient lives in [place].  He is a native of       [place].  He is an Art gallery manager at [employer].  He drinks a glass of wine       nightly.  He is a nonsmoker.  He is a prior Animal nutritionist.             Physical Exam  General:     NAD Abdomen:     Bowel sounds positive,abdomen soft and non-tender without masses, organomegaly or hernias noted. Rectal:     internal hemorrhoid(s).   Prostate:     no gland enlargement and no nodules.      Impression & Recommendations:  Problem # 1:  ANAL OR RECTAL PAIN (EAV-409.81) Assessment: New  Orders: Anoscopy (19147) Procedure: Anoscopy Indication: Rectal pain Risks and benefits were explained. The pt. was placed in the L decubitus position. Digital rectal exam revealed no masses. Anoscope was introduced w/o difficulties, was painful. Upon withdrawl, a carefull look at the mucosa was obtained. At  6 o'clock a   7     mm int hemorrhoids was present with active inflamation Impression:  No anal fissure. Inflamed int hemorrhoid. Disposition: see A&P.  Tolerated well. Complications: none.   Problem # 2:  HEMORRHOIDS, NOS (ICD-455.6) Assessment: New As above Orders: Anoscopy (46600) Anusol HC, Triamc   Complete Medication List: 1)  Toprol Xl 25 Mg Xr24h-tab (Metoprolol succinate) .[name].. 1 once daily 2)  Benazepril-hydrochlorothiazide 20-25 Mg Tabs (Benazepril-hydrochlorothiazide) .[name].. 1 by mouth qd 3)  Simvastatin 40 Mg Tabs (Simvastatin) .[name].. 1 by mouth qd 4)  Vitamin D3 1000 Unit Tabs (Cholecalciferol) .[name].. 1 qd 5)  Aspirin 325 Mg Tabs (Aspirin) .[name].. 1 qd 6)  Triamcinolone Acetonide 0.1 % Oint (Triamcinolone acetonide) .... Use 2-4 times a day 7)  Anusol-hc 25 Mg Supp (Hydrocortisone acetate) .[name].. 1 pr two times a day x 7-10 d 8)  Darvocet-n 100 100-650 Mg Tabs (Propoxyphene n-apap) .[name].. 1 by mouth 2 times daily as needed for pain 9)  Metamucil 0.52 Gm Caps (Psyllium) .... 2 caps bid   Patient Instructions: 1)  Call if you are not better in a reasonable ammount of time or if worse.   Prescriptions: DARVOCET-N 100 100-650 MG TABS (PROPOXYPHENE N-APAP) 1 by mouth 2 times daily as needed for pain  #30 x  1   Entered and Authorized by:   Tresa Garter MD   Signed by:   Tresa Garter MD on 10/29/2008   Method used:   Print then Give to Patient   RxID:   [id number] ANUSOL-HC 25 MG SUPP (HYDROCORTISONE ACETATE) 1 pr two times a day x 7-10 d  #20 x 1   Entered and Authorized by:   Tresa Garter MD   Signed by:   Tresa Garter MD on 10/29/2008   Method used:   Print then Give to Patient   RxID:   [id number] TRIAMCINOLONE ACETONIDE 0.1 % OINT (TRIAMCINOLONE ACETONIDE) Use 2-4 times a day  #60g x 3   Entered and Authorized by:   Tresa Garter MD   Signed by:   Tresa Garter MD on 10/29/2008   Method used:   Print then Give to Patient   RxID:   [id number]

## 2010-10-15 NOTE — Progress Notes (Signed)
Summary: Declined Appointment   Phone Note Outgoing Call   Call placed by: Dagoberto Reef,  December 06, 2008 1:42 PM Summary of Call: Called patient to inform him of appt. Patient said he had made appt with Dr Luisa Hart ([facility]) for 12/26/08.Declined  appt with Dr Jarold Motto.  Initial call taken by: Dagoberto Reef,  December 06, 2008 1:48 PM  Follow-up for Phone Call        OK Thx Follow-up by: Tresa Garter MD,  December 06, 2008 5:33 PM

## 2010-10-15 NOTE — Letter (Signed)
Summary: Hemorrhoids/[facility]  Hemorrhoids/[facility]   Imported By: Sherian Rein 01/31/2009 13:28:23  _____________________________________________________________________  External Attachment:    Type:   Image     Comment:   External Document

## 2010-10-15 NOTE — Assessment & Plan Note (Signed)
Summary: 6 MTH FU  STC   Vital Signs:  Patient profile:   70 year old male Height:      73 inches Weight:      216 pounds BMI:     28.60 Temp:     97.9 degrees F oral Pulse rate:   72 / minute Pulse rhythm:   regular Resp:     16 per minute BP sitting:   148 / 88  (left arm) Cuff size:   regular  Vitals Entered By: Lanier Prude, CMA(AAMA) (July 27, 2010 7:51 AM) CC: 6 mo f/u Is Patient Diabetic? No   Primary Care Soriya Worster:  Tresa Garter MD  CC:  6 mo f/u.  History of Present Illness: The patient presents for a follow up of hypertension, OA, carotid stenosis, hyperlipidemia. C/o occasional constipation. He has cut every pill in 1/2...   Current Medications (verified): 1)  Toprol Xl 25 Mg Xr24h-Tab (Metoprolol Succinate) .... One-Half Tablet Daily 2)  Benazepril-Hydrochlorothiazide 20-25 Mg Tabs (Benazepril-Hydrochlorothiazide) .[name].. 1 By Mouth Qd 3)  Simvastatin 40 Mg Tabs (Simvastatin) .[name].. 1 By Mouth Qd 4)  Vitamin D3 1000 Unit  Tabs (Cholecalciferol) .[name].. 1 Qd 5)  Aspirin 325 Mg Tabs (Aspirin) .[name].. 1 Qd  Allergies (verified): No Known Drug Allergies  Past History:  Past Medical History: Last updated: 03/10/2010 1. Hypertension. 2. Atrial fibrillation.  The patient had new-onset atrial fibrillation in November 2009.  His CHADS2 score is 1.  He underwent ibutilide cardioversion successfully.  He is not on Coumadin currently.  He is on aspirin 325 mg daily. 3. Hypercholesterolemia. 4. Echocardiogram in November 2009, EF was 65%.  There was mild LVH.  There was aortic sclerosis without stenosis (mean gradient 10 mmHg).  There was normal RV size and function.  5. Osteoarthritis. 6. Low back pain.  Social History: Last updated: 03/11/2009 The patient lives in [place].  He is a native of [place].  He is an Art gallery manager at [employer].  He drinks a glass of wine nightly.  He is a nonsmoker.  He is a former Geophysicist/field seismologist.   Review of Systems      The patient complains of weight gain.  The patient denies fever, chest pain, dyspnea on exertion, abdominal pain, and melena.         occasional constipation BP is OK  Physical Exam  General:  NAD Nose:  External nasal examination shows no deformity or inflammation. Nasal mucosa are pink and moist without lesions or exudates. Mouth:  Oral mucosa and oropharynx without lesions or exudates.  Teeth in good repair. Neck:  No deformities, masses, or tenderness noted. Lungs:  Normal respiratory effort, chest expands symmetrically. Lungs are clear to auscultation, no crackles or wheezes. Heart:  RRR Abdomen:  Bowel sounds positive,abdomen soft and non-tender without masses, organomegaly or hernias noted. Msk:  No deformity or scoliosis noted of thoracic or lumbar spine.   Neurologic:  No cranial nerve deficits noted. Station and gait are normal. Plantar reflexes are down-going bilaterally. DTRs are symmetrical throughout. Sensory, motor and coordinative functions appear intact. Skin:  moles Psych:  Cognition and judgment appear intact. Alert and cooperative with normal attention span and concentration. No apparent delusions, illusions, hallucinations   Impression & Recommendations:  Problem # 1:  CONSTIPATION, CHRONIC (ICD-564.09) Assessment Comment Only  Problem # 2:  ATRIAL FIBRILLATION (ICD-427.31) Assessment: Improved  His updated medication list for this problem includes:    Toprol Xl 25 Mg Xr24h-tab (Metoprolol succinate) ..... One-half  tablet daily    Aspirin 325 Mg Tabs (Aspirin) .[name]... 1 qd  Problem # 3:  CAROTID STENOSIS (ICD-433.10) Assessment: Unchanged  His updated medication list for this problem includes:    Aspirin 325 Mg Tabs (Aspirin) .[name]... 1 qd  Problem # 4:  HYPERTENSION (ICD-401.9) Assessment: Unchanged  His updated medication list for this problem includes:    Toprol Xl 25 Mg Xr24h-tab (Metoprolol succinate) ..... One-half tablet daily     Benazepril-hydrochlorothiazide 20-25 Mg Tabs (Benazepril-hydrochlorothiazide) .[name]... 1 by mouth qd  Problem # 5:  HEMORRHOIDS, NOS (ICD-455.6) Assessment: Improved  Complete Medication List: 1)  Toprol Xl 25 Mg Xr24h-tab (Metoprolol succinate) .... One-half tablet daily 2)  Benazepril-hydrochlorothiazide 20-25 Mg Tabs (Benazepril-hydrochlorothiazide) .[name].. 1 by mouth qd 3)  Simvastatin 40 Mg Tabs (Simvastatin) .[name].. 1 by mouth qd 4)  Vitamin D3 1000 Unit Tabs (Cholecalciferol) .[name].. 1 qd 5)  Aspirin 325 Mg Tabs (Aspirin) .[name].. 1 qd 6)  Amitiza 24 Mcg Caps (Lubiprostone) .[name].. 1 by mouth once daily as needed constipation 7)  Triamcinolone Acetonide 0.5 % Crea (Triamcinolone acetonide) .... Use two times a day prn  Other Orders: Admin 1st Vaccine (16109) Flu Vaccine 73yrs + (60454)  Patient Instructions: 1)  Please schedule a follow-up appointment in 6 months well w/labs. Prescriptions: TRIAMCINOLONE ACETONIDE 0.5 % CREA (TRIAMCINOLONE ACETONIDE) use two times a day prn  #120 g x 3   Entered and Authorized by:   Tresa Garter MD   Signed by:   Tresa Garter MD on 07/27/2010   Method used:   Print then Give to Patient   RxID:   [id number] AMITIZA 24 MCG CAPS (LUBIPROSTONE) 1 by mouth once daily as needed constipation  #30 x 12   Entered and Authorized by:   Tresa Garter MD   Signed by:   Tresa Garter MD on 07/27/2010   Method used:   Print then Give to Patient   RxID:   [id number]    Orders Added: 1)  Est. Patient Level IV [41324] 2)  Admin 1st Vaccine [90471] 3)  Flu Vaccine 77yrs + [40102]    Influenza Vaccine (to be given today)  Flu Vaccine Consent Questions     Do you have a history of severe allergic reactions to this vaccine? no    Any prior history of allergic reactions to egg and/or gelatin? no    Do you have a sensitivity to the preservative Thimersol? no    Do you have a past history of Guillan-Barre Syndrome? no    Do you  currently have an acute febrile illness? no    Have you ever had a severe reaction to latex? no    Vaccine information given and explained to patient? yes    Are you currently pregnant? no    Lot Number:[id number]   Exp Date:03/13/2011   Site Given  Left Deltoid IM Lanier Prude, [facility])  July 27, 2010 8:16 AM

## 2010-10-15 NOTE — Assessment & Plan Note (Signed)
Summary: 6 month rov/sl      Allergies Added: NKDA  Primary Liliane Mallis:  Tresa Garter MD  CC:  6 month ROV.  History of Present Illness: 70 yo with history of HTN and paroxysmal atrial fibrillation returns for followup of his atrial fib.  Patient is in sinus rhythm today and has noted no episodes of racing heart rate or irregular heart rate.  He has been doing well in general.  He is walking for exercise (though he is limited somewhat by his low back pain).  He denies any shortness of breath or chest pain with exertion.  He is now taking a statin and lipids are better.  BP is elevated today, but on the patient's home cuff, SBP has been averaging in the 110s-120s.  It is always < 140 at home.   ECG:  NSR at 58, normal  Labs (6/10): LDL 85, HDL 37, TSH normal, creatinine 0.9, K 4.6  Current Medications (verified): 1)  Toprol Xl 25 Mg Xr24h-Tab (Metoprolol Succinate) .[name].. 1 Once Daily 2)  Benazepril-Hydrochlorothiazide 20-25 Mg Tabs (Benazepril-Hydrochlorothiazide) .[name].. 1 By Mouth Qd 3)  Simvastatin 40 Mg Tabs (Simvastatin) .[name].. 1 By Mouth Qd 4)  Vitamin D3 1000 Unit  Tabs (Cholecalciferol) .[name].. 1 Qd 5)  Aspirin 325 Mg Tabs (Aspirin) .[name].. 1 Qd  Allergies (verified): No Known Drug Allergies  Past History:  Past Medical History: 1. Hypertension. 2. Atrial fibrillation.  The patient had new-onset atrial fibrillation     in November 2009.  His CHADS2 score is 1.  He underwent ibutilide cardioversion successfully.  He is not on       Coumadin currently.  He is on aspirin 325 mg daily. 3. Hypercholesterolemia. 4. Echocardiogram in November 2009, EF was 65%.  There was mild LVH.     There was aortic sclerosis without stenosis (mean gradient 10 mmHg).  There was normal RV     size and function.  5. Osteoarthritis. 6. Low back pain.  Family History: Reviewed history from 08/28/2008 and no changes required. Hypertension. No early CAD.   Social History: The patient lives in  [place].  He is a native of [place].  He is an Art gallery manager at [employer].  He drinks a glass of wine nightly.  He is a nonsmoker.  He is a former Geophysicist/field seismologist.   Review of Systems       all reviewed and negative except as per HPI.   Vital Signs:  Patient profile:   70 year old male Height:      73 inches Weight:      214 pounds BMI:     28.34 Pulse rate:   55 / minute BP sitting:   152 / 84  (left arm)  Vitals Entered By: Stanton Kidney, EMT-P (March 11, 2009 10:51 AM)  Physical Exam  General:  Well developed, well nourished, in no acute distress. Neck:  Neck supple, no JVD. No masses, thyromegaly or abnormal cervical nodes. Lungs:  Clear bilaterally to auscultation and percussion. Heart:  Non-displaced PMI, chest non-tender; regular rate and rhythm, S1, S2 without rubs or gallops. 2/6 crescendo-decrescendo murmur RUSB and apex.  S2 is heard clearly.  Carotid upstroke normal, no bruit. Pedals normal pulses. No edema, no varicosities. Abdomen:  Bowel sounds positive; abdomen soft and non-tender without masses, organomegaly, or hernias noted. No hepatosplenomegaly. Extremities:  No clubbing or cyanosis. Neurologic:  Alert and oriented x 3. Psych:  Normal affect.   Impression & Recommendations:  Problem #  1:  ATRIAL FIBRILLATION (ICD-427.31) Paroxysmal atrial fibrillation.  Patient is in NSR today and has had no symptoms suggestive of atrial fibrillation.  CHADS2 score = 1 for HTN.  Patient is taking ASA 325 mg daily and Toprol XL for rate control.   Problem # 2:  HYPERTENSION (ICD-401.9) BP seems to be always at goal at home but was high here today.  I think it would be helpful for him to have his BP cuff calibrated.  I will not change his meds today.    Problem # 3:  HYPERLIPIDEMIA (ICD-272.4) Lipids much better on statin.  There is also some evidence that the statin may decrease risk of recurrent atrial fibrillation.   Problem # 4:  AORTIC SCLEROSIS Patient has a  loud aortic area murmur but no significant aortic valve gradient on echo.  This likely represents minimal aortic stenosis.  I will have the patient return in a year and we will do an echo to reassess at that time.   Patient Instructions: 1)  Your physician recommends that you schedule a follow-up appointment in: 1 year

## 2010-10-15 NOTE — Assessment & Plan Note (Signed)
Summary: 3 MO ROV/NWS   Vital Signs:  Patient Profile:   70 Years Old Male Weight:      222 pounds Pulse rate:   69 / minute BP sitting:   174 / 92  (left arm)  Vitals Entered By: Tora Perches (August 14, 2007 8:42 AM)             Is Patient Diabetic? No     Chief Complaint:  Multiple medical problems or concerns.  History of Present Illness: The patient presents for a follow up of hypertension, elev. glucose, hyperlipidemia   Current Allergies: ! SULFA CODEINE  Past Medical History:    Hyperlipidemia    Hypertension    Osteoarthritis    Low back pain  Past Surgical History:    Reviewed history from 04/08/2007 and no changes required:       Back surgery x12 years ago       Lumbar laminectomy   Social History:    Occupation: Nurse, children's    Married    Never Smoked   Risk Factors:  Tobacco use:  never    Physical Exam  General:     Well-developed,well-nourished,in no acute distress; alert,appropriate and cooperative throughout examination Eyes:     No corneal or conjunctival inflammation noted. EOMI. Perrla. Funduscopic exam benign, without hemorrhages, exudates or papilledema. Vision grossly normal. Nose:     External nasal examination shows no deformity or inflammation. Nasal mucosa are pink and moist without lesions or exudates. Mouth:     Oral mucosa and oropharynx without lesions or exudates.  Teeth in good repair. Neck:     No deformities, masses, or tenderness noted. Lungs:     Normal respiratory effort, chest expands symmetrically. Lungs are clear to auscultation, no crackles or wheezes. Heart:     Normal rate and regular rhythm. S1 and S2 normal without gallop, murmur, click, rub or other extra sounds. Abdomen:     Bowel sounds positive,abdomen soft and non-tender without masses, organomegaly or hernias noted. Msk:     No deformity or scoliosis noted of thoracic or lumbar spine.   Extremities:     No clubbing, cyanosis, edema, or  deformity noted with normal full range of motion of all joints.   Neurologic:     No cranial nerve deficits noted. Station and gait are normal. Plantar reflexes are down-going bilaterally. DTRs are symmetrical throughout. Sensory, motor and coordinative functions appear intact. Skin:     Intact without suspicious lesions or rashes Psych:     Cognition and judgment appear intact. Alert and cooperative with normal attention span and concentration. No apparent delusions, illusions, hallucinations    Impression & Recommendations:  Problem # 1:  LOW BACK PAIN (ICD-724.2) Assessment: Unchanged  His updated medication list for this problem includes:    Aspirin 325 Mg Tabs (Aspirin) .[name]... 1 qd   Problem # 2:  CAROTID STENOSIS (ICD-433.10) Assessment: Unchanged Advised statins. His updated medication list for this problem includes:    Aspirin 325 Mg Tabs (Aspirin) .[name]... 1 qd   Problem # 3:  OSTEOARTHRITIS (ICD-715.90) Assessment: Unchanged  His updated medication list for this problem includes:    Aspirin 325 Mg Tabs (Aspirin) .[name]... 1 qd   Problem # 4:  HYPERTENSION (ICD-401.9) Assessment: Deteriorated The labs were reviewd with the patient.   His updated medication list for this problem includes:    Azor 10-40 Mg Tabs (Amlodipine-olmesartan) .[name]... 1 once daily    Triamterene-hctz 37.5-25 Mg Caps (Triamterene-hctz) .Marland KitchenMarland KitchenMarland KitchenMarland Kitchen 1  po qam  The following medications were removed from the medication list:    Diovan Hct 320-25 Mg Tabs (Valsartan-hydrochlorothiazide) .[name]... 1 once daily    Hydrochlorothiazide 12.5 Mg Tabs (Hydrochlorothiazide) .[name]... 1 once daily  His updated medication list for this problem includes:    Azor 10-40 Mg Tabs (Amlodipine-olmesartan) .[name]... 1 once daily    Triamterene-hctz 37.5-25 Mg Caps (Triamterene-hctz) .[name]... 1 po qam   Complete Medication List: 1)  Azor 10-40 Mg Tabs (Amlodipine-olmesartan) .[name].. 1 once daily 2)  Vitamin D3 1000 Unit Tabs  (Cholecalciferol) .[name].. 1 qd 3)  Triamterene-hctz 37.5-25 Mg Caps (Triamterene-hctz) .[name].. 1 po qam 4)  Lovastatin 40 Mg Tabs (Lovastatin) .[name].. 1 po qd 5)  Aspirin 325 Mg Tabs (Aspirin) .[name].. 1 qd   Patient Instructions: 1)  Please schedule a follow-up appointment in 3 months. 2)  BMP prior to visit, ICD-9: 3)  Hepatic Panel prior to visit, ICD-9: 4)  Lipid Panel prior to visit, ICD-9:272.0  995.2 5)  TSH prior to visit, ICD-9:    Prescriptions: AZOR 10-40 MG  TABS (AMLODIPINE-OLMESARTAN) 1 once daily  #30 x 12   Entered and Authorized by:   Tresa Garter MD   Signed by:   Tresa Garter MD on 08/14/2007   Method used:   Print then Give to Patient   RxID:   [id number] LOVASTATIN 40 MG TABS (LOVASTATIN) 1 po qd  #30 x 12   Entered and Authorized by:   Tresa Garter MD   Signed by:   Tresa Garter MD on 08/14/2007   Method used:   Print then Give to Patient   RxID:   [id number] TRIAMTERENE-HCTZ 37.5-25 MG CAPS (TRIAMTERENE-HCTZ) 1 po qam  #30 x 12   Entered and Authorized by:   Tresa Garter MD   Signed by:   Tresa Garter MD on 08/14/2007   Method used:   Print then Give to Patient   RxID:   [id number]  ]

## 2010-10-15 NOTE — Assessment & Plan Note (Signed)
Summary: 6 MTH WELLNESS--[employer] PER PT--[facility]   Vital Signs:  Patient profile:   70 year old male Height:      73 inches Weight:      215.50 pounds BMI:     28.53 O2 Sat:      98 % on Room air Temp:     97.8 degrees F oral Pulse rate:   61 / minute BP sitting:   132 / 72  (left arm) Cuff size:   large  Vitals Entered By: Lucious Groves (December 29, 2009 8:32 AM)  O2 Flow:  Room air CC: 6 mo wellness checkup--Per pt no symptoms or complaints./kb Is Patient Diabetic? No Pain Assessment Patient in pain? no        Primary Care Provider:  Tresa Garter MD  CC:  6 mo wellness checkup--Per pt no symptoms or complaints./kb.  History of Present Illness: The patient presents for a wellness examination  C/o constipation  Current Medications (verified): 1)  Toprol Xl 25 Mg Xr24h-Tab (Metoprolol Succinate) .[name].. 1 Once Daily 2)  Benazepril-Hydrochlorothiazide 20-25 Mg Tabs (Benazepril-Hydrochlorothiazide) .[name].. 1 By Mouth Qd 3)  Simvastatin 40 Mg Tabs (Simvastatin) .[name].. 1 By Mouth Qd 4)  Vitamin D3 1000 Unit  Tabs (Cholecalciferol) .[name].. 1 Qd 5)  Aspirin 325 Mg Tabs (Aspirin) .[name].. 1 Qd  Allergies (verified): No Known Drug Allergies  Past History:  Past Medical History: Last updated: 03/11/2009 1. Hypertension. 2. Atrial fibrillation.  The patient had new-onset atrial fibrillation     in November 2009.  His CHADS2 score is 1.  He underwent ibutilide cardioversion successfully.  He is not on       Coumadin currently.  He is on aspirin 325 mg daily. 3. Hypercholesterolemia. 4. Echocardiogram in November 2009, EF was 65%.  There was mild LVH.     There was aortic sclerosis without stenosis (mean gradient 10 mmHg).  There was normal RV     size and function.  5. Osteoarthritis. 6. Low back pain.  Past Surgical History: Last updated: 03/10/2009 Back surgery x12 years ago Lumbar laminectomy Hemorrhoidectomy 2010  Family History: Last updated: 08/28/2008 Hypertension. No early  CAD.   Social History: Last updated: 03/11/2009 The patient lives in [place].  He is a native of [place].  He is an Art gallery manager at [employer].  He drinks a glass of wine nightly.  He is a nonsmoker.  He is a former Geophysicist/field seismologist.   Review of Systems  The patient denies anorexia, fever, weight loss, weight gain, vision loss, decreased hearing, hoarseness, chest pain, syncope, dyspnea on exertion, peripheral edema, prolonged cough, headaches, hemoptysis, abdominal pain, melena, hematochezia, severe indigestion/heartburn, hematuria, incontinence, genital sores, muscle weakness, suspicious skin lesions, transient blindness, difficulty walking, depression, unusual weight change, abnormal bleeding, enlarged lymph nodes, angioedema, and testicular masses.    Physical Exam  General:  NAD Nose:  External nasal examination shows no deformity or inflammation. Nasal mucosa are pink and moist without lesions or exudates. Mouth:  Oral mucosa and oropharynx without lesions or exudates.  Teeth in good repair. Lungs:  Normal respiratory effort, chest expands symmetrically. Lungs are clear to auscultation, no crackles or wheezes. Heart:  RRR Abdomen:  Bowel sounds positive,abdomen soft and non-tender without masses, organomegaly or hernias noted. Rectal:  declined Msk:  No deformity or scoliosis noted of thoracic or lumbar spine.   Neurologic:  No cranial nerve deficits noted. Station and gait are normal. Plantar reflexes are down-going bilaterally. DTRs are symmetrical throughout. Sensory, motor  and coordinative functions appear intact. Skin:  moles Psych:  Cognition and judgment appear intact. Alert and cooperative with normal attention span and concentration. No apparent delusions, illusions, hallucinations   Impression & Recommendations:  Problem # 1:  WELL ADULT EXAM (ICD-V70.0) Assessment New Health and age related issues were discussed. Available screening tests and vaccinations were  discussed as well. Healthy life style including good diet and execise was discussed.  The labs were reviewed with the patient.   Problem # 2:  HEMORRHOIDS, NOS (ICD-455.6) Assessment: Improved  Problem # 3:  ATRIAL FIBRILLATION (ICD-427.31) Assessment: Improved  His updated medication list for this problem includes:    Toprol Xl 25 Mg Xr24h-tab (Metoprolol succinate) .[name]... 1 once daily    Aspirin 325 Mg Tabs (Aspirin) .[name]... 1 qd  Problem # 4:  CONSTIPATION, CHRONIC (ICD-564.09) aggravated by meds Assessment: Deteriorated Amitiza His updated medication list for this problem includes:    Metamucil 0.52 Gm Caps (Psyllium) .[name]... 1 by mouth two times a day for a stool softner  Problem # 5:  Moles Assessment: Unchanged He had seen a dermatologist  Complete Medication List: 1)  Toprol Xl 25 Mg Xr24h-tab (Metoprolol succinate) .[name].. 1 once daily 2)  Benazepril-hydrochlorothiazide 20-25 Mg Tabs (Benazepril-hydrochlorothiazide) .[name].. 1 by mouth qd 3)  Simvastatin 40 Mg Tabs (Simvastatin) .[name].. 1 by mouth qd 4)  Vitamin D3 1000 Unit Tabs (Cholecalciferol) .[name].. 1 qd 5)  Aspirin 325 Mg Tabs (Aspirin) .[name].. 1 qd 6)  Metamucil 0.52 Gm Caps (Psyllium) .[name].. 1 by mouth two times a day for a stool softner 7)  Amitiza 24 Mcg Caps (Lubiprostone) .[name].. 1 by mouth once daily as needed constipation 8)  Triamcinolone Acetonide 0.5 % Crea (Triamcinolone acetonide) .... Use two times a day prn  Patient Instructions: 1)  Please schedule a follow-up appointment in 6 months. 2)  BMP prior to visit, ICD-9: 3)  Hepatic Panel prior to visit, ICD-9: 4)  Lipid Panel prior to visit, ICD-9: 95.20  Contraindications/Deferment of Procedures/Staging:    Test/Procedure: Pneumovax vaccine    Reason for deferment: patient declined  Prescriptions: TRIAMCINOLONE ACETONIDE 0.5 % CREA (TRIAMCINOLONE ACETONIDE) use two times a day prn  #120 g x 3   Entered and Authorized by:   Tresa Garter MD   Signed by:   Tresa Garter MD on 12/29/2009   Method used:   Print then Give to Patient   RxID:   [id number] AMITIZA 24 MCG CAPS (LUBIPROSTONE) 1 by mouth once daily as needed constipation  #30 x 12   Entered and Authorized by:   Tresa Garter MD   Signed by:   Tresa Garter MD on 12/29/2009   Method used:   Print then Give to Patient   RxID:   [id number] METAMUCIL 0.52 GM CAPS (PSYLLIUM) 1 by mouth two times a day for a stool softner  #100 x 6   Entered and Authorized by:   Tresa Garter MD   Signed by:   Tresa Garter MD on 12/29/2009   Method used:   Print then Give to Patient   RxID:   [id number]

## 2010-10-15 NOTE — Assessment & Plan Note (Signed)
Summary: Cardiology Nuclear Testing  Nuclear Med Background Indications for Stress Test: Evaluation for Ischemia  Indications Comments: 08/12/10 ED CP (-) enzymes   History: Echo, GXT  History Comments: 2000 GXT NL 11/09 ECHO EF 55-65% H/O PAF RVR   Symptoms: Chest Pressure, Chest Tightness, Dizziness, Light-Headedness, Palpitations, SOB  Symptoms Comments: Last episode of CP- this AM.   Nuclear Pre-Procedure Cardiac Risk Factors: Carotid Disease, History of Smoking, Hypertension, Lipids Caffeine/Decaff Intake: None NPO After: 7:30 AM IV 0.9% NS with Angio Cath: 22g     IV Site: R Antecubital IV Started by: Bonnita Levan, RN Chest Size (in) 44     Height (in): 71 Weight (lb): 209 BMI: 29.25  Nuclear Med Study 1 or 2 day study:  1 day     Stress Test Type:  Stress Reading MD:  Cassell Clement, MD     Referring MD:  D.McLean Resting Radionuclide:  Technetium 22m Tetrofosmin     Resting Radionuclide Dose:  11.0 mCi  Stress Radionuclide:  Technetium 93m Tetrofosmin     Stress Radionuclide Dose:  33.0 mCi   Stress Protocol Exercise Time (min):  10:51 min     Max HR:  144 bpm     Predicted Max HR:  150 bpm  Max Systolic BP: 181 mm Hg     Percent Max HR:  96 %Rate Pressure Product:  16109    Stress Test Technologist:  Bonnita Levan, RN     Nuclear Technologist:  Doyne Keel, CNMT  Rest Procedure  Myocardial perfusion imaging was performed at rest 45 minutes following the intravenous administration of Technetium 40m Tetrofosmin.  Stress Procedure  The patient exercised for 10:51.  He stopped due to leg fatigue and dyspnea. He c/o Chest pressure 5/10, during the last stage of exercise, which resolved during recovery.  There were non-specific ST-T wave changes with exercise.  Technetium 32m Tetrofosmin was injected at peak exercise and myocardial perfusion imaging was performed after a brief delay.  QPS Raw Data Images:  Normal; no motion artifact; normal heart/lung  ratio. Stress Images:  Normal homogeneous uptake in all areas of the myocardium. Rest Images:  Normal homogeneous uptake in all areas of the myocardium. Subtraction (SDS):  No evidence of ischemia. Transient Ischemic Dilatation:  0.90  (Normal <1.22)  Lung/Heart Ratio:  0.28  (Normal <0.45)  Quantitative Gated Spect Images QGS EDV:  102 ml QGS ESV:  32 ml QGS EF:  69 % QGS cine images:  No wall motion abnormalities.  Findings Normal nuclear study Clinically Abnormal (chest pain, ST abnormality, hypotension)      Overall Impression  Exercise Capacity: Good exercise capacity. BP Response: Normal blood pressure response. Clinical Symptoms: Mild chest pain/dyspnea. ECG Impression: No significant ST segment change suggestive of ischemia. Overall Impression: Normal stress nuclear study.  Appended Document: Cardiology Nuclear Testing Normal myoview images  Appended Document: Cardiology Nuclear Testing pt notified of results by telephone

## 2010-10-15 NOTE — Assessment & Plan Note (Signed)
Summary: F/U CARDIOLITE/D.MILLER   Visit Type:  Follow-up Primary Provider:  Georgina Quint [name] MD  CC:  no complaints.  History of Present Illness: 70 yo with history of HTN and paroxysmal atrial fibrillation returns for followup.  Patient had recurrent atrial fibrillation with rapid response about a month ago.  He was quite symptomatic with palpitations and chest pressure.  Symptoms and tachycardia resolved after about 3-4 hours.  He gets the episodes periodically, 1-2 times a year.  Patient was seen by Tereso Newcomer and set up for echo and myoview.  Myoview showed excellent exercise tolerance and no evidence for ischemia or infarction.  Report says he had some chest pressure but patient denies having any chest pressure on the treadmill.  Echo showed normal LV systolic function with mild aortic stenosis and mild LV hypertrophy. At baseline, patient has no exertional chest pain or shortness of breath.  He walks about 3.5 miles several times a week with no problems. His BP still tends to run a bit high, 140s systolic when he checks it at home.   ECG: NSR, normal  Current Medications (verified): 1)  Toprol Xl 25 Mg Xr24h-Tab (Metoprolol Succinate) .... One-Half Tablet Daily 2)  Benazepril-Hydrochlorothiazide 20-25 Mg Tabs (Benazepril-Hydrochlorothiazide) .[name].. 1 By Mouth Qd 3)  Simvastatin 40 Mg Tabs (Simvastatin) .[name].. 1 By Mouth Qd 4)  Aspirin 325 Mg Tabs (Aspirin) .[name].. 1 Qd 5)  Amitiza 24 Mcg Caps (Lubiprostone) .[name].. 1 By Mouth Once Daily As Needed Constipation 6)  Triamcinolone Acetonide 0.5 % Crea (Triamcinolone Acetonide) .... Use Two Times A Day Prn  Allergies (verified): No Known Drug Allergies  Past History:  Past Medical History: 1. Hypertension. 2. Atrial fibrillation.  The patient had new-onset atrial fibrillation in November 2009.  He underwent ibutilide cardioversion successfully.  He has had 1-2 episodes/year that are short-lived that likely are atrial fibrillation with RVR.   CHADSVASC score 2.  3. Hypercholesterolemia. 4. Aortic stenosis: Mild. Echo (7/11): EF 55-60%, mild LV hypertrophy, mild aortic stenosis with mean gradient 19 mmHg and peak gradient 36 mmHg.  5. Osteoarthritis. 6. Low back pain. 7. Chest pain: ETT-myoview (12/11) with 10:51 exercise, no chest pain, no significant ST changes, EF 69%, no evidence for ischemia or infarction.   Family History: Reviewed history from 08/28/2008 and no changes required. Hypertension. No early CAD.   Social History: Reviewed history from 03/11/2009 and no changes required. The patient lives in [place].  He is a native of [place].  He is an Art gallery manager at [employer].  He drinks a glass of wine nightly.  He is a nonsmoker.  He is a former Geophysicist/field seismologist.   Review of Systems       All systems reviewed and negative except as per HPI.   Vital Signs:  Patient profile:   70 year old male Height:      71 inches Weight:      213.50 pounds BMI:     29.88 Pulse rate:   60 / minute BP sitting:   136 / 82  (left arm) Cuff size:   regular  Vitals Entered By: Caralee Ates CMA (September 17, 2010 10:59 AM)  Physical Exam  General:  Well developed, well nourished, in no acute distress. Neck:  Neck supple, no JVD. No masses, thyromegaly or abnormal cervical nodes. Lungs:  Clear bilaterally to auscultation and percussion. Heart:  Non-displaced PMI, chest non-tender; regular rate and rhythm, S1, S2 without murmurs, rubs. +S4. Carotid upstroke normal, no bruit. Pedals  normal pulses. No edema, no varicosities. Abdomen:  Bowel sounds positive; abdomen soft and non-tender without masses, organomegaly, or hernias noted. No hepatosplenomegaly. Extremities:  No clubbing or cyanosis. Neurologic:  Alert and oriented x 3. Psych:  Normal affect.   Impression & Recommendations:  Problem # 1:  CHEST PAIN (ICD-786.50) Good exercise tolerance on myoview with no evidence for ischemia or infarction.  Continue ASA.    Problem # 2:  AORTIC STENOSIS (ICD-424.1) Mild by recent echo.  Continue periodic surveillance.   Problem # 3:  ATRIAL FIBRILLATION (ICD-427.31) Paroxysmal.  CHADSVASC score 2.  Rare episodes but they are quite symptomatic when they occur.  I will not increase Toprol XL further as his HR runs in the 50s for the most part at home.  We discussed flecainide or dronedarone use to try to keep him out of atrial fibrillation.  He feels that the episodes are rare enough that he does not want to use an antiarrhythmic regularly.  Pill-in-pocket strategy remains an option.  Given CHADSVASC 2, I suggested that he take Pradaxa.  He really does not want anticoagulation and understands that there is a higher stroke risk.  He will continue ASA.   Problem # 4:  HYPERTENSION (ICD-401.9) As BP is still running high, I am going to increase benazepril/HCTZ to 40/25.  Control of BP and use of ACEI may decrease risk of recurrent atrial fibrillation. BMET in 2 weeks.   Followup in 6 months.   Patient Instructions: 1)  Your physician has recommended you make the following change in your medication:  2)  Start  Benazepril 20mg  daily--take this with the Benazepril 20/25mg  that you already take. 3)  Your physician recommends that you return for lab work in: 2weeks--BMP 427.31  4019 4)  Your physician wants you to follow-up in: 6 months with Dr Harmon Dun 2012) You will receive a reminder letter in the mail two months in advance. If you don't receive a letter, please call our office to schedule the follow-up appointment. Prescriptions: BENAZEPRIL HCL 20 MG TABS (BENAZEPRIL HCL) one daily  #30 x 6   Entered by:   Katina Dung, RN, BSN   Authorized by:   Marca Ancona, MD   Signed by:   Katina Dung, RN, BSN on 09/17/2010   Method used:   Print then Give to Patient   RxID:   [id number]

## 2010-10-15 NOTE — Progress Notes (Signed)
  Phone Note Call from Patient Call back at Home Phone [phone] Call back at Work Phone [phone]   Summary of Call: Pt c/o continued problem from last office visit, he wants to know if he should make another apt w/Dr [name] or would dr refer pt? Initial call taken by: Lamar Sprinkles,  December 04, 2008 3:23 PM  Follow-up for Phone Call        Will sch a GI cons for him Follow-up by: Tresa Garter MD,  December 05, 2008 6:46 PM  Additional Follow-up for Phone Call Additional follow up Details #1::        Pt's wife informed Additional Follow-up by: Lamar Sprinkles,  December 06, 2008 9:01 AM

## 2010-10-21 NOTE — Procedures (Signed)
Summary: Summary Report  Summary Report   Imported By: Erle Crocker 10/14/2010 11:04:45  _____________________________________________________________________  External Attachment:    Type:   Image     Comment:   External Document

## 2010-10-29 ENCOUNTER — Other Ambulatory Visit: Payer: Self-pay

## 2010-11-24 LAB — BASIC METABOLIC PANEL
BUN: 13 mg/dL (ref 6–23)
CO2: 33 mEq/L — ABNORMAL HIGH (ref 19–32)
Calcium: 9.5 mg/dL (ref 8.4–10.5)
Chloride: 105 mEq/L (ref 96–112)
Creatinine, Ser: 1.14 mg/dL (ref 0.4–1.5)
GFR calc Af Amer: >60 mL/min (ref >60)
GFR calc non Af Amer: >60 mL/min (ref >60)
Glucose, Bld: 109 mg/dL — ABNORMAL HIGH (ref 70–99)
Potassium: 4 mEq/L (ref 3.5–5.1)
Sodium: 142 mEq/L (ref 135–145)

## 2010-11-24 LAB — DIFFERENTIAL
Basophils Absolute: 0 10*3/uL (ref 0.0–0.1)
Basophils Relative: 0 % (ref 0–1)
Eosinophils Absolute: 0.3 10*3/uL (ref 0.0–0.7)
Eosinophils Relative: 4 % (ref 0–5)
Lymphocytes Relative: 36 % (ref 12–46)
Lymphs Abs: 2.4 10*3/uL (ref 0.7–4.0)
Monocytes Absolute: 0.5 10*3/uL (ref 0.1–1.0)
Monocytes Relative: 8 % (ref 3–12)
Neutro Abs: 3.6 10*3/uL (ref 1.7–7.7)
Neutrophils Relative %: 53 % (ref 43–77)

## 2010-11-24 LAB — RAPID URINE DRUG SCREEN, HOSP PERFORMED
Amphetamines: NOT DETECTED
Barbiturates: NOT DETECTED
Benzodiazepines: NOT DETECTED
Cocaine: NOT DETECTED
Opiates: NOT DETECTED
Tetrahydrocannabinol: NOT DETECTED

## 2010-11-24 LAB — CBC
HCT: 43.8 % (ref 39.0–52.0)
Hemoglobin: 14.6 g/dL (ref 13.0–17.0)
MCH: 30.9 pg (ref 26.0–34.0)
MCHC: 33.3 g/dL (ref 30.0–36.0)
MCV: 92.6 fL (ref 78.0–100.0)
Platelets: 163 10*3/uL (ref 150–400)
RBC: 4.73 MIL/uL (ref 4.22–5.81)
RDW: 12.8 % (ref 11.5–15.5)
WBC: 6.8 10*3/uL (ref 4.0–10.5)

## 2010-11-24 LAB — URINALYSIS, ROUTINE W REFLEX MICROSCOPIC
Bilirubin Urine: NEGATIVE
Glucose, UA: NEGATIVE mg/dL
Hgb urine dipstick: NEGATIVE
Ketones, ur: NEGATIVE mg/dL
Nitrite: NEGATIVE
Protein, ur: NEGATIVE mg/dL
Specific Gravity, Urine: 1.014 (ref 1.005–1.030)
Urobilinogen, UA: 0.2 mg/dL (ref 0.0–1.0)
pH: 7 (ref 5.0–8.0)

## 2010-11-24 LAB — POCT CARDIAC MARKERS
CKMB, poc: 1.3 ng/mL (ref 1.0–8.0)
Myoglobin, poc: 79 ng/mL (ref 12–200)
Troponin i, poc: <0.05 ng/mL (ref 0.00–0.09)

## 2010-12-01 ENCOUNTER — Other Ambulatory Visit: Payer: Self-pay | Admitting: Internal Medicine

## 2010-12-22 LAB — DIFFERENTIAL
Basophils Absolute: 0 10*3/uL (ref 0.0–0.1)
Basophils Relative: 0 % (ref 0–1)
Eosinophils Absolute: 0.2 10*3/uL (ref 0.0–0.7)
Eosinophils Relative: 3 % (ref 0–5)
Lymphocytes Relative: 25 % (ref 12–46)
Lymphs Abs: 1.5 10*3/uL (ref 0.7–4.0)
Monocytes Absolute: 0.5 10*3/uL (ref 0.1–1.0)
Monocytes Relative: 8 % (ref 3–12)
Neutro Abs: 3.9 10*3/uL (ref 1.7–7.7)
Neutrophils Relative %: 64 % (ref 43–77)

## 2010-12-22 LAB — COMPREHENSIVE METABOLIC PANEL
ALT: 49 U/L (ref 0–53)
AST: 33 U/L (ref 0–37)
Albumin: 4.1 g/dL (ref 3.5–5.2)
Alkaline Phosphatase: 69 U/L (ref 39–117)
BUN: 20 mg/dL (ref 6–23)
CO2: 29 mEq/L (ref 19–32)
Calcium: 9.2 mg/dL (ref 8.4–10.5)
Chloride: 105 mEq/L (ref 96–112)
Creatinine, Ser: 1.06 mg/dL (ref 0.4–1.5)
GFR calc Af Amer: >60 mL/min (ref >60)
GFR calc non Af Amer: >60 mL/min (ref >60)
Glucose, Bld: 113 mg/dL — ABNORMAL HIGH (ref 70–99)
Potassium: 4.2 mEq/L (ref 3.5–5.1)
Sodium: 141 mEq/L (ref 135–145)
Total Bilirubin: 0.7 mg/dL (ref 0.3–1.2)
Total Protein: 6.9 g/dL (ref 6.0–8.3)

## 2010-12-22 LAB — CBC
HCT: 43.5 % (ref 39.0–52.0)
Hemoglobin: 14.9 g/dL (ref 13.0–17.0)
MCHC: 34.2 g/dL (ref 30.0–36.0)
MCV: 90.5 fL (ref 78.0–100.0)
Platelets: 206 10*3/uL (ref 150–400)
RBC: 4.8 MIL/uL (ref 4.22–5.81)
RDW: 12.6 % (ref 11.5–15.5)
WBC: 6 10*3/uL (ref 4.0–10.5)

## 2010-12-25 ENCOUNTER — Other Ambulatory Visit: Payer: Self-pay | Admitting: Internal Medicine

## 2010-12-25 DIAGNOSIS — Z0389 Encounter for observation for other suspected diseases and conditions ruled out: Secondary | ICD-10-CM

## 2010-12-25 DIAGNOSIS — Z0000 Encounter for general adult medical examination without abnormal findings: Secondary | ICD-10-CM

## 2010-12-29 ENCOUNTER — Telehealth: Payer: Self-pay | Admitting: Internal Medicine

## 2010-12-29 NOTE — Telephone Encounter (Signed)
Ok CMET, lipids 272.0 thx

## 2010-12-29 NOTE — Telephone Encounter (Signed)
Message copied by Sonda Primes on Tue Dec 29, 2010 10:37 PM ------      Message from: Daphane Shepherd      Created: Tue Dec 22, 2010 10:44 AM      Regarding: labs      Contact: [phone]       Patient states he need labs before his appt shc'd for 01/05/11 at 4:15pm, for his 6 mths f/u. Please assist and notify patient.

## 2010-12-31 ENCOUNTER — Other Ambulatory Visit: Payer: Self-pay | Admitting: Internal Medicine

## 2011-01-05 ENCOUNTER — Ambulatory Visit: Payer: Self-pay | Admitting: Internal Medicine

## 2011-01-26 NOTE — Consult Note (Signed)
NAMEELAI, VANWYK               ACCOUNT NO.:  [id number]   MEDICAL RECORD NO.:  [id number]          PATIENT TYPE:  INP   LOCATION:  1407                         FACILITY:  [facility]   PHYSICIAN:  Marca Ancona, MD      DATE OF BIRTH:  15-Jun-1941   DATE OF CONSULTATION:  07/29/2008  DATE OF DISCHARGE:                                 CONSULTATION   HISTORY OF PRESENT ILLNESS:  This is a 70 year old with a past medical  history significant for hypertension who presented with new-onset atrial  fibrillation.  The patient states that he developed the symptoms  suddenly yesterday at 11 a.m.  He was at church and suddenly became  short of breath.  He also noted dizziness and light-headedness,  especially with standing.  He took his blood pressure on his home blood  pressure cuff and found his systolic blood pressure to be in the 90s and  felt his pulse racing.  He went to the emergency department and in the  emergency department had atrial fibrillation with rapid ventricular  response in the 160s was noted.  He was started on Diltiazem drip and  his rate decreased.  The patient does state that he felt completely  normal until 11 a.m. yesterday.  He had no symptoms what so ever prior.  He is quite active at baseline and asymptomatic.  From a cardiopulmonary  standpoint, he walks 3-1/2 miles a day with no limitations.  He has no  history of chest pain.   MEDICATIONS:  1. Currently heparin drip.  2. Triamterine/hydrochlorothiazide.  3. Diltiazem drip 85 mg an hour.   No known drug allergies.   LABORATORY STUDIES:  Significant for cardiac enzymes negative x2.  Magnesium 2.3, potassium 4.0, creatinine 1.2, hematocrit 47.7.   PAST MEDICAL HISTORY:  1. Hypertension.  2. Hypertriglyceridemia.   SOCIAL HISTORY:  The patient lives in [place].  He drinks about a  glass of wine a night.  He does not use any alcohol.  He is a nonsmoker.  He is a native from [place].  He is an Art gallery manager for  [employer].   FAMILY HISTORY:  There is no early coronary artery disease and there is  no family history of atrial fibrillation.   REVIEW OF SYSTEMS:  Negative, except as noted in the history of present  illness.   EXAM:  VITAL SIGNS:  Temperature 98.3, O2 saturation 95% on room air.  Heart rate is 89-114 and irregular.  Blood pressure is 170-175.  GENERAL:  This is a well-developed male in no apparent distress.  NEUROLOGIC:  Alert and oriented x3.  Normal affect.  NECK:  There is no JVD.  LUNGS:  Clear to auscultation bilaterally.  CARDIOVASCULAR:  Heart is tachycardic.  Irregular S1, S2.  No S3, no S4.  No murmur.  No edema.  No carotid bruit.  HEENT:  Normal exam.  ABDOMEN:  Soft and nontender.  Normal bowel sounds.  No  hepatosplenomegaly.  EXTREMITIES:  No cyanosis, clubbing.  SKIN:  Normal.  MUSCULOSKELETAL:  Normal.   EKG shows atrial fibrillation with rate of 100, QTc  is less than 440.   ASSESSMENT AND PLAN:  This is a 70 year old with a history of  hypertension who presents with new-onset atrial fibrillation which began  at 11 a.m. yesterday.  He is within 48 hours of the onset of his atrial  fibrillation.  His only risk factor appears to be hypertension.  His  CHADS 2 score is 1.  He is on a heparin drip and a Diltiazem drip.   PLAN:  1. He currently needs better rate control.  We will increase his      Diltiazem to 10 mg an hour.  2. It would be reasonable to attempt rhythm control, as this is the      patient's first atrial fibrillation episode.  We will plan to      attempt a chemical cardioversion with ibutilide today.  His      potassium is greater than 4, his magnesium is greater than 2, and      his QTc is less than 440.  If this fails, we will attempt DC      cardioversion.  If the patient cardioverts successfully, we will      need to anticoagulate him with Coumadin for a month.  He will need      a Lovenox bridge.  3. We will obtain and echocardiogram.  4. We  will check his lipids.  He also needs his TSH, which apparently      has been drawn, but the result is not back.      Marca Ancona, MD  Electronically Signed     DM/MEDQ  D:  07/29/2008  T:  07/29/2008  Job:  [id number]

## 2011-01-26 NOTE — Discharge Summary (Signed)
NAMEISAUL, LANDI               ACCOUNT NO.:  [id number]   MEDICAL RECORD NO.:  [id number]          PATIENT TYPE:  INP   LOCATION:  1407                         FACILITY:  [facility]   PHYSICIAN:  Valerie A. Felicity Coyer, MDDATE OF BIRTH:  1940/10/07   DATE OF ADMISSION:  07/28/2008  DATE OF DISCHARGE:  07/30/2008                               DISCHARGE SUMMARY   PRIMARY CARE PHYSICIAN:  Georgina Quint. [name], M.D.   DISCHARGE DIAGNOSES:  1. New onset atrial fibrillation with rapid ventricular response.  2. Hypothyroidism.  3. History of hypertension.   HISTORY OF PRESENT ILLNESS:  Mr. Eguia is a 70 year old male with past  medical history of hypertension who reported to the [facility]  emergency room on the day of admission with reports of shortness of  breath and low blood pressure.  The patient reports sudden onset of  symptoms on the day of admission associated with dizziness and  lightheadedness, worse with standing.  The patient checked his blood  pressure at home and found his systolic pressure to be in the 90s.  Upon  initial evaluation in the emergency room, the patient was found to be in  atrial fibrillation with rapid ventricular rate at 160 beats per minute.  The patient was admitted at that time for further evaluation and  treatment.  Cardiology was asked to see the patient in consultation.   PAST MEDICAL HISTORY:  1. Hypertension.  2. Hypertriglyceridemia.   CONSULTATIONS DURING THIS ADMISSION:  [facility].   PROCEDURES DURING THIS HOSPITALIZATION:  1. 2-D echocardiogram revealing left ventricular ejection fraction of      65%.  The study was inadequate for evaluation of left ventricular      wall motion abnormalities.  2. Chest x-ray at time of admission with no acute findings.   COURSE OF HOSPITALIZATION:  1. New onset atrial fibrillation with rapid ventricular response.  The      patient started on IV Diltiazem while in the emergency room which      was  continued upon admission to floor.  The patient was seen in      consultation by cardiology.  The patient did convert to a normal      sinus rhythm with Ibutilide and Cardizem drip.  At this time, the      patient has been transitioned to p.o. Cardizem and maintaining a      sinus rhythm.  The patient with a [place] score of 1 with history of      hypertension.  The patient has refused Coumadin therapy at this      time, as he feels it will greatly limit his lifestyle.  At the time      of discharge, the patient is to continue aspirin, as well as p.o.      Cardizem with close outpatient follow up with cardiology.  2. Minimally elevated TSH.  3. Questionable hypothyroidism.  The patient with minimally elevated      TSH of 4.926, question whether or not this is secondary to sick      euthyroid.  Will have the patient  follow up in the office with      thyroid function tests per primary care physician.  4. Hyperlipidemia.  LDL obtained during this admission 140.  The      patient has agreed to statin medication, as well as continued diet      and exercise.   MEDICATIONS AT TIME OF DISCHARGE:  1. Aspirin 325 mg p.o. daily.  2. Niacin 1 tablet p.o. daily.  3. Diltiazem CD 240 mg p.o. daily.  4. Simvastatin 40 mg p.o. daily.  (The patient instructed to discontinue triamterene HCTZ and Azor).   DISPOSITION:  The patient felt medically stable for discharge home at  this time.  Again, the patient has converted to a normal sinus rhythm  following ibutilide and Diltiazem.  The patient is instructed to follow  up with his primary care physician, Dr. Sonda Primes, on Monday,  August 05, 2008 at 3:45 p.m. for repeat thyroid function tests.  In  addition, the patient is scheduled to see Dr. Shirlee Latch with [facility] on August 13, 2008 at 4:00 p.m.      Cordelia Pen, NP      Raenette Rover. Felicity Coyer, MD  Electronically Signed    LE/MEDQ  D:  09/25/2008  T:  09/26/2008  Job:   [id number]   cc:   Georgina Quint. [name], MD  [address]  [place]  [place] [address]   Marca Ancona, MD  [address]  [place] [address]

## 2011-01-26 NOTE — Op Note (Signed)
[name] [name], [name] [name]               ACCOUNT NO.:  [id number]   MEDICAL RECORD NO.:  [id number]          PATIENT TYPE:  AMB   LOCATION:  SDS                          FACILITY:  [facility]   PHYSICIAN:  Thomas A. Cornett, M.D.DATE OF BIRTH:  1941-01-02   DATE OF PROCEDURE:  01/21/2009  DATE OF DISCHARGE:  01/21/2009                               OPERATIVE REPORT   PREOPERATIVE DIAGNOSIS:  Anal pain with history of internal hemorrhoids.   POSTOPERATIVE DIAGNOSES:  1. 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  2. Left lateral hemorrhoids with grade 3 internal and external      hemorrhoid disease with milder grade 2 internal hemorrhoid disease      on the right posterior and right anterior position.   PROCEDURE:  1. Internal and external hemorrhoidectomy.  2. Lateral internal sphincterotomy.   SURGEON:  Maisie Fus A. Cornett, MD.   ANESTHESIA:  LMA with 0.25% Sensorcaine local.   ESTIMATED BLOOD LOSS:  100 mL.   SPECIMEN:  Hemorrhoidal tissue to pathology.   DRAINS:  None.  Rectal packing in place.   INDICATIONS FOR PROCEDURE:  The patient is a 70 year old male who has  had longstanding history of what he calls hemorrhoids.  He is seen in  the office, indeed he had some hemorrhoid disease with anal pain.  Examination was somewhat difficult and my concern was for potential anal  fissure or hemorrhoid disease.  I felt that exploration in the operating  room with exam under anesthesia would be the best way to look at him and  treat the underlying disease which he has had a longstanding problem  with.  He presents today for the above.   DESCRIPTION OF PROCEDURE:  The patient was brought to the operating room  and placed in lithotomy after induction of LMA anesthesia.  He was  appropriately padded.  Perineum was then prepped and draped in sterile  fashion.  A digital examination was done.  Upon placement of anoscope,  in the posterior midline was a  chronic-appearing anal fissure with  exposed internal sphincter.  His hemorrhoid disease was not all that  bad.  He had some grade 2 disease in the right anterior and posterior  positions without much of an external component.  In the left lateral  position was a grade 3 hemorrhoid with the external component.  I felt  that lateral internal sphincterotomy would help most of the symptoms and  a conservative approach is hemorrhoidectomy in his best interest.  I was  able to palpate the intersphincteric groove between the internal  sphincter and external sphincter.  The internal sphincter did feel  hypertrophied to me.  Using 11 blade, I advanced the blade into the  intersphincteric groove, turned the sharp edge toward the internal  sphincter, placed my finger over it and divided it.  I used my finger to  make sure the division was complete.  We preserved the external  sphincter.  Next, in the left lateral position where the sphincterotomy  was done, I elected to do a hemorrhoidectomy of this  area since it seem  to be the largest.  A stitch was placed in the apex of the hemorrhoid.  The hemorrhoidal tissue was excised down to the sphincter being careful  not to go too deep.  The external component was also excised.  I  oversewed this with a 3-0 Monocryl.  Bottom was left open.  Hemostasis  was achieved.  I reexamined the remainder of his anal canal and saw  minimal hemorrhoid disease and nothing I felt needed to be addressed.  Gelfoam wrap with Surgicel was placed with 2% of lidocaine jelly.  I  infiltrated the anal canal with 0.25% Sensorcaine with epinephrine.  The  patient was then taken out of lithotomy.  Sterile dressings were  applied.  All final counts were correct.  He was taken to the recovery  room in satisfactory condition.      Thomas A. Cornett, M.D.  Electronically Signed     TAC/MEDQ  D:  01/21/2009  T:  01/21/2009  Job:  [id number]   cc:   Georgina Quint. [name], MD

## 2011-01-26 NOTE — H&P (Signed)
NAMELONN, IM               ACCOUNT NO.:  [id number]   MEDICAL RECORD NO.:  [id number]          PATIENT TYPE:  EMS   LOCATION:  ED                           FACILITY:  [facility]   PHYSICIAN:  Therisa Doyne, MD    DATE OF BIRTH:  Sep 06, 1941   DATE OF ADMISSION:  07/28/2008  DATE OF DISCHARGE:                              HISTORY & PHYSICAL   PRIMARY CARE PHYSICIAN:  Georgina Quint. [name], MD   CHIEF COMPLAINT:  Shortness of breath and dizziness.   HISTORY OF PRESENT ILLNESS:  A 70 year old white male with past medical  history significant for hypertension who presents to the emergency  department with complaints of shortness of breath and dizziness.  He was  in his usual state of health until 11:00 a.m. on Sunday morning when he  developed shortness of breath.  This lasted for approximately 15  minutes.  He checked his heart rate and noted that it was elevated to  120s.  His systolic blood pressure was approximately 100.  Throughout  the rest of the day he felt off.  Around 5:00 p.m. he became dizzy and  because of these symptoms he came to the emergency room for evaluation.  In the ED, he was found to be in atrial fibrillation with rapid  ventricular response.  He was given Cardizem 20 mg IV in emergency  department and started on Cardizem drip.   The patient denies any chest pain, syncope or prior episodes of rapid  heart rate.  He denies any recent abdominal pain, nausea, vomiting,  diarrhea or other worrisome signs or symptoms.   REVIEW OF SYSTEMS:  All systems were reviewed and are negative except  those mentioned above in history of present illness.   PAST MEDICAL HISTORY:  1. Hypertension.  2. Hyperlipidemia.   SOCIAL HISTORY:  The patient lives in [place].  He denies tobacco or  drugs and drinks one glass of wine at night.   FAMILY HISTORY:  There is no history of atrial fibrillation.   ALLERGIES:  NO KNOWN DRUG ALLERGIES.   MEDICATIONS:  1. Azor 10/40 mg  daily.  2. Triamterene HCTZ 37.5/25 mg daily.  3. Lovaza daily.  4. Vitamin D.  5. Aspirin 81 mg daily.   PHYSICAL EXAM:  VITAL SIGNS:  Temperature 97.6, blood pressure 114/77,  pulse on arrival 160, current pulse 110, respirations 20, saturation  100% on room air.  GENERAL:  No acute distress.  HEENT: Normocephalic atraumatic.  The pupils are equal, round and  reactive to light and accommodation.  Extraocular movements intact.  Oropharynx pink and moist without lesions.  Neck is supple.  No  lymphadenopathy.  No jugular venous distention.  No masses.  CARDIOVASCULAR:  Irregularly irregular.  No murmurs, rubs or gallops.  CHEST:  Clear to auscultation bilaterally.  ABDOMEN:  Positive bowel sounds, soft, nontender and nondistended.  EXTREMITIES:  No clubbing, cyanosis or edema.  Dorsalis pedis pulses 2+  bilaterally.  SKIN:  No rashes.  BACK:  No CVA tenderness.   LABORATORY STUDIES:  Hemoglobin 17.7 and glucose 126.  Normal renal  function.  Troponin less than 0.05 and coagulation studies within normal  limits.   Chest X-Ray:  No acute cardiopulmonary disease.   EKG #1 shows atrial fibrillation with rapid ventricular response of 162  beats per minute.   EKG #2 shows a regular rhythm at 94 beats per minute.  It is unclear  whether or not this is normal sinus rhythm versus an ectopic atrial  pacemaker as the P-waves have unusual axis in the inferior leads.   ASSESSMENT/PLAN:  A 70 year old white male with past medical history  significant for hypertension who presents to the emergency room with new  onset atrial fibrillation with rapid ventricular response.   1. We will admit the patient to the [facility] to      telemetry bed.   1. Atrial fibrillation with rapid ventricular response.  The patient      received a Cardizem bolus in the emergency room and his current      heart rate is 110.  We will rebolus him with 10 mg Cardizem and      increase the drip  to 10 mg per hour.  Will titrate this drip up to      total of 15 mg per hour to achieve a heart rate of less than 100.      We will also start the patient on systemic anticoagulation with a      heparin drip.  We will check thyroid function studies as well as      transthoracic echocardiogram to rule out any structural      abnormalities to his heart.  This appears to be new onset atrial      fibrillation which began at 11:00 a.m. on Sunday morning.  It would      be reasonable to consult cardiology in the morning for potential DC      cardioversion.  DC cardioverson could be done without a      transesophageal echocardiogram since this appears to have started      on Sunday morning.  Because his blood pressures were mildly low      with his rapid ventricular response we will hold his Azor; however,      we will continue him on his triamterene / HCTZ.   1. Hypertension.  Continue triamterene / hydrochlorothiazide. Hold      Azor.   1. Fluids, electrolytes, nutrition.  Normal saline at 75 mL/hour.      Electrolytes are stable.  Check magnesium.  Regular diet.   1. Deep venous thrombosis prophylaxis.  Patient is being systemically      anticoagulated with heparin drip.      Therisa Doyne, MD  Electronically Signed     SJT/MEDQ  D:  07/28/2008  T:  07/28/2008  Job:  [id number]

## 2011-01-26 NOTE — Assessment & Plan Note (Signed)
[facility]                            CARDIOLOGY OFFICE NOTE   NAME:MAJICKassim, Guertin                        MRN:          [id number]  DATE:08/13/2008                            DOB:          Feb 16, 1941    PRIMARY CARE PHYSICIAN:  Georgina Quint. [name], MD   HISTORY OF PRESENT ILLNESS:  This is a 70 year old with history of  hypertension and paroxysmal atrial fibrillation who presents to  Cardiology Clinic for followup after a recent hospitalization for a new-  onset atrial fibrillation.  The patient states that in early November  2009, he had been to a horse race in [place], [place].  The night  after the race, he decided to drive straight through to [facility] to  come home, so he drove all night drinking multiple caffeinated beverages  and got home in the morning and did not go to sleep, instead he went to  church that morning.  At church, he developed dizziness and  lightheadedness.  He went to the emergency department and was found to  be in new-onset atrial fibrillation with a heart rate in the 160s and a  systolic blood pressure in the 80s.  He was admitted to the hospital,  rate controlled with diltiazem.  His cardiac enzymes were negative.  The  onset of his atrial fibrillation was thought to be quite definitely in  the morning on Sunday.  So, on Monday morning, he underwent a chemical  cardioversion with ibutilide successfully and converted to normal sinus  rhythm.  The patient was anticoagulated with Lovenox and Coumadin and  was sent home on Coumadin with Lovenox bridge.  However, he did actually  stopped the Coumadin on his own accord after few days.  Since coming out  of the hospital, the patient feels like he has been doing well.  In  general, he is back to walking about 3 miles a day.  He does not get any  dyspnea on exertion.  He is quite active.  He has had no further  episodes of palpitations or elevated heart rate.  He states he  checks  his pulse a couple of times a day to make sure it is slow and regular.  The only new thing he has noticed has been significant lower leg  swelling that he has noted since he came home from the hospital.  The  patient has no episodes of chest pain, orthopnea, PND, or any further  lightheadedness.   PAST MEDICAL HISTORY:  1. Hypertension.  2. Atrial fibrillation.  The patient had new-onset atrial fibrillation      in November 2009.  His CHADS2 score is 1.  He is not on Coumadin.      He is on aspirin 325 mg daily.  3. Hypercholesterolemia.  4. Echocardiogram in November 2009, EF was 65%.  There was mild LVH.      There was aortic sclerosis without stenosis.  There was normal RV      size and function.   SOCIAL HISTORY:  The patient lives in [place].  He is a native of  [place].  He is an Art gallery manager at [employer].  He drinks a glass of wine  nightly.  He is a nonsmoker.  He is a prior Geophysicist/field seismologist.   FAMILY HISTORY:  No family history of early coronary artery disease.   MEDICATIONS:  1. Triamterene/HCTZ half a tab 37.5/25 pill daily.  He has been taking      this for about 3 days now.  2. Zocor 40 mg daily.  3. Diltiazem CD 240 mg daily.  4. Aspirin 325 mg daily.   LABORATORY VALUES:  Most recent labs in November 2009; triglycerides  154, HDL 27, and LDL 140.  The patient has been started on Zocor since  that time.  Creatinine 1.09 and TSH is 4.96, which is slightly elevated.   REVIEW OF SYSTEMS:  Negative, except as noted in the history of present  illness.   EKG shows a sinus bradycardia at a rate of 55.   PHYSICAL EXAMINATION:  VITAL SIGNS:  Blood pressure is 143/78 and heart  rate is 55 and regular.  GENERAL:  This is a well-developed male in no apparent distress.  NEUROLOGIC:  Alert and oriented x3.  Normal affect.  NECK:  There is no JVD.  There is no thyromegaly or thyroid nodule.  CARDIOVASCULAR:  Heart regular S1 and S2.  No S3.  No S4.  There  is a  2/6 early peaking systolic crescendo-decrescendo murmur at the right  upper sternal border.  S2 is heard clearly.  EXTREMITIES:  There is 1+ edema about halfway of the lower legs  bilaterally.  ABDOMEN:  Soft and nontender.  No hepatosplenomegaly.  LUNGS:  Clear to auscultation bilaterally with normal respiratory  effort.  SKIN:  Normal exam.  MUSCULOSKELETAL:  Normal exam.  HEENT:  Normal exam.   ASSESSMENT AND PLAN:  This is a 70 year old with a history of  hypertension and paroxysmal atrial fibrillation, who presents to  Cardiology Clinic for followup after recent hospitalization.  1. Atrial fibrillation.  The patient has had 1 episode of atrial      fibrillation.  He underwent chemical cardioversion with ibutilide.      His CHADS2 score is 1.  He is not on Coumadin; therefore, he is on      aspirin 325 mg daily.  He has had no further episodes of      palpitations or rapid heart rate.  He has been on diltiazem for      rate control.  However, he feels like his pulse is too low and he      also has developed some significant lower extremity edema.      Therefore, I am going to stop his diltiazem and put him on Toprol-      XL 50 mg daily.  2. Hypertension.  The patient's blood pressure is elevated today at      143/78.  I have told him to take a full tablet      triamterene/hydrochlorothiazide daily.  He will be on also on      Toprol-XL 50 mg daily.  We will have him come back in 2 weeks and      check his blood pressure at that time.  3. Hyperlipidemia.  The patient's LDL cholesterol is 140 in the      hospital.  We will have him      come back in a couple of months to check a full lipid panel.  4. I will see the patient  back in clinic in 6 months.     Marca Ancona, MD  Electronically Signed    DM/MedQ  DD: 08/13/2008  DT: 08/14/2008  Job #: [id number]   cc:   Georgina Quint. [name], MD

## 2011-01-29 NOTE — Procedures (Signed)
[facility]                                PROCEDURE NOTE   NAME:[name], [name]                        MRN:          [id number]  DATE:12/21/2006                            DOB:          Aug 02, 1941    PROCEDURE:  Skin biopsy.   INDICATION:  Growing subcutaneous nodule under the skin on the right  eyebrow.     Risks, including incomplete procedure, bleeding, infection, scar  formation, others, were explained to the patient in detail.  He agreed  to proceed.   He was placed in the right decubitus position.  The skin was prepped,  and the lesion was identified and injected around with 1 mL of 2%  lidocaine with epinephrine.  The location was in the middle of the right  eyebrow.  Then the skin was prepped with Betadine and alcohol.  I was  covered.  The area was draped with a fenestrated sterile field.  The  incision, under 1 cm, was made with a round blade.  The round, elastic  cyst was identified in the subcutaneous tissue and grabbed with forceps.  It measured about 2-3 mm in diameter, and was extracted and removed,  specimen sent to the lab.  The wound was closed with two 6.0 Ethilon  sutures, antibiotic ointment applied, wound instructions provided.  Tolerated well.   COMPLICATIONS:  None.   Come back for suture removal in 5-7 days.   PROCEDURE #2:  Cryosurgery.   INDICATION:  Actinic keratosis on the face.   Two lesions, one over the right temple, one on the right cheek, were  treated with liquid nitrogen in the usual fashion.  Tolerated well.   COMPLICATIONS:  None.     Georgina Quint. [name], MD  Electronically Signed    AVP/MedQ  DD: 12/21/2006  DT: 12/21/2006  Job #: [id number]

## 2011-01-29 NOTE — Assessment & Plan Note (Signed)
[facility]                             PRIMARY CARE OFFICE NOTE   NAME:Riddell, [facility]                        MRN:          [id number]  DATE:03/25/2006                            DOB:          1941/05/06    PROCEDURE:  Abscess incision and drainage.   INDICATIONS:  Refractory to conservative treatment, infected sebaceous cyst  in the area of right lateral eye brow.  The risks including incomplete  procedure, scar formation, infection, spread, bleeding, and others as well  as benefits were explained to the patient in detail.  She agreed to proceed.   DESCRIPTION OF PROCEDURE:  She was placed in the decubitus position.  The  area was prepped with Betadine and alcohol and injected with 1 mL of 2%  lidocaine with epinephrine.  An incision measuring 0.5 cm was made with a  straight blade on the eye brow.  The cyst was penetrated and probed with  blunt forceps.  Approximately 0.5 mL of purulent and cream cheese-like  material was extracted, about one-third of an inch Iodoform gauze was placed  in the cavity.  The wound was covered with antibiotic ointment and non-  adhesive dressing.   Wound instructions provided.  Tolerated it well.  Complications, none.  He  was instructed to remove the drainage tomorrow.  I will see him back next  week.  He will continue with an antibiotic.                                   Sonda Primes, MD   AP/MedQ  DD:  03/27/2006  DT:  03/27/2006  Job #:  [id number]

## 2011-02-04 ENCOUNTER — Other Ambulatory Visit (INDEPENDENT_AMBULATORY_CARE_PROVIDER_SITE_OTHER): Payer: BC Managed Care – PPO

## 2011-02-04 DIAGNOSIS — Z0389 Encounter for observation for other suspected diseases and conditions ruled out: Secondary | ICD-10-CM

## 2011-02-04 DIAGNOSIS — Z0000 Encounter for general adult medical examination without abnormal findings: Secondary | ICD-10-CM

## 2011-02-04 LAB — CBC WITH DIFFERENTIAL/PLATELET
Basophils Absolute: 0 10*3/uL (ref 0.0–0.1)
Basophils Relative: 0.3 % (ref 0.0–3.0)
Eosinophils Absolute: 0.3 10*3/uL (ref 0.0–0.7)
Eosinophils Relative: 6.1 % — ABNORMAL HIGH (ref 0.0–5.0)
HCT: 43.1 % (ref 39.0–52.0)
Hemoglobin: 14.8 g/dL (ref 13.0–17.0)
Lymphocytes Relative: 22.7 % (ref 12.0–46.0)
Lymphs Abs: 1.3 10*3/uL (ref 0.7–4.0)
MCHC: 34.3 g/dL (ref 30.0–36.0)
MCV: 91.9 fl (ref 78.0–100.0)
Monocytes Absolute: 0.6 10*3/uL (ref 0.1–1.0)
Monocytes Relative: 10 % (ref 3.0–12.0)
Neutro Abs: 3.4 10*3/uL (ref 1.4–7.7)
Neutrophils Relative %: 60.9 % (ref 43.0–77.0)
Platelets: 169 10*3/uL (ref 150.0–400.0)
RBC: 4.69 Mil/uL (ref 4.22–5.81)
RDW: 13.3 % (ref 11.5–14.6)
WBC: 5.6 10*3/uL (ref 4.5–10.5)

## 2011-02-04 LAB — URINALYSIS, ROUTINE W REFLEX MICROSCOPIC
Bilirubin Urine: NEGATIVE
Hgb urine dipstick: NEGATIVE
Ketones, ur: NEGATIVE
Leukocytes, UA: NEGATIVE
Nitrite: NEGATIVE
Specific Gravity, Urine: 1.025 (ref 1.000–1.030)
Total Protein, Urine: NEGATIVE
Urine Glucose: NEGATIVE
Urobilinogen, UA: 0.2 (ref 0.0–1.0)
pH: 6 (ref 5.0–8.0)

## 2011-02-04 LAB — LIPID PANEL
Cholesterol: 119 mg/dL (ref 0–200)
HDL: 38.2 mg/dL — ABNORMAL LOW (ref >39.00)
LDL Cholesterol: 67 mg/dL (ref 0–99)
Total CHOL/HDL Ratio: 3
Triglycerides: 68 mg/dL (ref 0.0–149.0)
VLDL: 13.6 mg/dL (ref 0.0–40.0)

## 2011-02-04 LAB — BASIC METABOLIC PANEL
BUN: 18 mg/dL (ref 6–23)
CO2: 30 mEq/L (ref 19–32)
Calcium: 8.9 mg/dL (ref 8.4–10.5)
Chloride: 106 mEq/L (ref 96–112)
Creatinine, Ser: 0.9 mg/dL (ref 0.4–1.5)
GFR: 89.57 mL/min (ref >60.00)
Glucose, Bld: 105 mg/dL — ABNORMAL HIGH (ref 70–99)
Potassium: 3.5 mEq/L (ref 3.5–5.1)
Sodium: 142 mEq/L (ref 135–145)

## 2011-02-04 LAB — HEPATIC FUNCTION PANEL
ALT: 24 U/L (ref 0–53)
AST: 24 U/L (ref 0–37)
Albumin: 3.9 g/dL (ref 3.5–5.2)
Alkaline Phosphatase: 58 U/L (ref 39–117)
Bilirubin, Direct: 0.1 mg/dL (ref 0.0–0.3)
Total Bilirubin: 0.5 mg/dL (ref 0.3–1.2)
Total Protein: 6.6 g/dL (ref 6.0–8.3)

## 2011-02-04 LAB — PSA: PSA: 2.58 ng/mL (ref 0.10–4.00)

## 2011-02-04 LAB — TSH: TSH: 2.25 u[IU]/mL (ref 0.35–5.50)

## 2011-02-09 ENCOUNTER — Encounter: Payer: Self-pay | Admitting: Internal Medicine

## 2011-02-10 ENCOUNTER — Ambulatory Visit (INDEPENDENT_AMBULATORY_CARE_PROVIDER_SITE_OTHER): Payer: BC Managed Care – PPO | Admitting: Internal Medicine

## 2011-02-10 ENCOUNTER — Encounter: Payer: Self-pay | Admitting: Internal Medicine

## 2011-02-10 DIAGNOSIS — E785 Hyperlipidemia, unspecified: Secondary | ICD-10-CM

## 2011-02-10 DIAGNOSIS — R972 Elevated prostate specific antigen [PSA]: Secondary | ICD-10-CM

## 2011-02-10 DIAGNOSIS — R739 Hyperglycemia, unspecified: Secondary | ICD-10-CM

## 2011-02-10 DIAGNOSIS — R7309 Other abnormal glucose: Secondary | ICD-10-CM

## 2011-02-10 MED ORDER — BENAZEPRIL-HYDROCHLOROTHIAZIDE 20-25 MG PO TABS: 1.0000 | ORAL_TABLET | Freq: Every day | ORAL | Status: DC

## 2011-02-10 MED ORDER — SIMVASTATIN 40 MG PO TABS: 40.0000 mg | ORAL_TABLET | Freq: Every day | ORAL | Status: DC

## 2011-02-10 MED ORDER — METOPROLOL SUCCINATE ER 25 MG PO TB24: 25.0000 mg | ORAL_TABLET | Freq: Every day | ORAL | Status: DC

## 2011-02-10 NOTE — Progress Notes (Signed)
  Subjective:    Patient ID: [name] [name], male    DOB: [birth year], 70 y.o.   MRN: [id number]  HPI The patient is here for a wellness exam. The patient has been doing well overall without major physical or psychological issues going on lately. The patient presents for a follow-up of  chronic hypertension, chronic dyslipidemia,OA controlled with medicines C/o L hip pain worse w/walking at times     Review of Systems  Constitutional: Positive for chills. Negative for appetite change and fatigue.  HENT: Negative for congestion, drooling and neck stiffness.   Eyes: Negative for redness.  Respiratory: Negative for choking.   Gastrointestinal: Negative for constipation and rectal pain.  Genitourinary: Negative for urgency and frequency.  Musculoskeletal: Negative for back pain and joint swelling.  Skin: Negative for wound.  Neurological: Negative for tremors.  Psychiatric/Behavioral: Negative for confusion. The patient is not nervous/anxious.        Objective:   Physical Exam  Constitutional: He is oriented to person, place, and time. He appears well-developed.  HENT:  Mouth/Throat: Oropharynx is clear and moist.  Eyes: Conjunctivae are normal. Pupils are equal, round, and reactive to light.  Neck: Normal range of motion. No JVD present. No thyromegaly present.  Cardiovascular: Normal rate, regular rhythm, normal heart sounds and intact distal pulses.  Exam reveals no gallop and no friction rub.   No murmur heard. Pulmonary/Chest: Effort normal and breath sounds normal. No respiratory distress. He has no wheezes. He has no rales. He exhibits no tenderness.  Abdominal: Soft. Bowel sounds are normal. He exhibits no distension and no mass. There is no tenderness. There is no rebound and no guarding.  Musculoskeletal: Normal range of motion. He exhibits tenderness (L hip pain). He exhibits no edema.  Lymphadenopathy:    He has no cervical adenopathy.  Neurological: He is alert and  oriented to person, place, and time. He has normal reflexes. No cranial nerve deficit. He exhibits normal muscle tone. Coordination normal.  Skin: Skin is warm and dry. No rash noted.  Psychiatric: His behavior is normal. Judgment and thought content normal.  L IT band hurts w/palp and ROM      Lab Results  Component Value Date   WBC 5.6 02/04/2011   HGB 14.8 02/04/2011   HCT 43.1 02/04/2011   PLT 169.0 02/04/2011   CHOL 119 02/04/2011   TRIG 68.0 02/04/2011   HDL 38.20* 02/04/2011   LDLDIRECT 151.5 11/20/2007   ALT 24 02/04/2011   AST 24 02/04/2011   NA 142 02/04/2011   K 3.5 02/04/2011   CL 106 02/04/2011   CREATININE 0.9 02/04/2011   BUN 18 02/04/2011   CO2 30 02/04/2011   TSH 2.25 02/04/2011   PSA 2.58 02/04/2011   HGBA1C 5.4 08/09/2007     Assessment & Plan:  Wellness  We discussed age appropriate health related issues, including available/recomended screening tests and vaccinations. We discussed a need for adhering to healthy diet and exercise. Labs/EKG were reviewed/ordered. All questions were answered.  Elev PSA  Will recheck in 6 mo  HTN On Rx  Elev GLU  Watching diet  L IT band pain  Stretch

## 2011-02-11 MED ORDER — VITAMIN D 1000 UNITS PO TABS: 1000.0000 [IU] | ORAL_TABLET | Freq: Every day | ORAL | Status: DC

## 2011-04-10 ENCOUNTER — Emergency Department (HOSPITAL_COMMUNITY)
Admission: EM | Admit: 2011-04-10 | Discharge: 2011-04-10 | Disposition: A | Discharge status: Home / Self Care | Payer: BC Managed Care – PPO | Attending: Emergency Medicine | Admitting: Emergency Medicine

## 2011-04-10 ENCOUNTER — Emergency Department (HOSPITAL_COMMUNITY): Payer: BC Managed Care – PPO

## 2011-04-10 DIAGNOSIS — I4891 Unspecified atrial fibrillation: Secondary | ICD-10-CM | POA: Insufficient documentation

## 2011-04-10 DIAGNOSIS — R002 Palpitations: Secondary | ICD-10-CM | POA: Insufficient documentation

## 2011-04-10 DIAGNOSIS — Z7982 Long term (current) use of aspirin: Secondary | ICD-10-CM | POA: Insufficient documentation

## 2011-04-10 DIAGNOSIS — R079 Chest pain, unspecified: Secondary | ICD-10-CM | POA: Insufficient documentation

## 2011-04-10 LAB — CBC
HCT: 42.7 % (ref 39.0–52.0)
Hemoglobin: 14.5 g/dL (ref 13.0–17.0)
MCH: 30.2 pg (ref 26.0–34.0)
MCHC: 34 g/dL (ref 30.0–36.0)
MCV: 89 fL (ref 78.0–100.0)
Platelets: 163 10*3/uL (ref 150–400)
RBC: 4.8 MIL/uL (ref 4.22–5.81)
RDW: 12.4 % (ref 11.5–15.5)
WBC: 5.1 10*3/uL (ref 4.0–10.5)

## 2011-04-10 LAB — COMPREHENSIVE METABOLIC PANEL
ALT: 38 U/L (ref 0–53)
AST: 31 U/L (ref 0–37)
Albumin: 3.8 g/dL (ref 3.5–5.2)
Alkaline Phosphatase: 69 U/L (ref 39–117)
BUN: 12 mg/dL (ref 6–23)
CO2: 29 mEq/L (ref 19–32)
Calcium: 9.5 mg/dL (ref 8.4–10.5)
Chloride: 104 mEq/L (ref 96–112)
Creatinine, Ser: 0.91 mg/dL (ref 0.50–1.35)
GFR calc Af Amer: >60 mL/min (ref >60)
GFR calc non Af Amer: >60 mL/min (ref >60)
Glucose, Bld: 113 mg/dL — ABNORMAL HIGH (ref 70–99)
Potassium: 3.7 mEq/L (ref 3.5–5.1)
Sodium: 141 mEq/L (ref 135–145)
Total Bilirubin: 0.4 mg/dL (ref 0.3–1.2)
Total Protein: 6.8 g/dL (ref 6.0–8.3)

## 2011-04-10 LAB — CK TOTAL AND CKMB (NOT AT ARMC)
CK, MB: 3.7 ng/mL (ref 0.3–4.0)
Relative Index: 2.7 — ABNORMAL HIGH (ref 0.0–2.5)
Total CK: 139 U/L (ref 7–232)

## 2011-04-10 LAB — DIFFERENTIAL
Basophils Absolute: 0 10*3/uL (ref 0.0–0.1)
Basophils Relative: 0 % (ref 0–1)
Eosinophils Absolute: 0.2 10*3/uL (ref 0.0–0.7)
Eosinophils Relative: 5 % (ref 0–5)
Lymphocytes Relative: 29 % (ref 12–46)
Lymphs Abs: 1.5 10*3/uL (ref 0.7–4.0)
Monocytes Absolute: 0.5 10*3/uL (ref 0.1–1.0)
Monocytes Relative: 9 % (ref 3–12)
Neutro Abs: 2.9 10*3/uL (ref 1.7–7.7)
Neutrophils Relative %: 57 % (ref 43–77)

## 2011-04-10 LAB — PROTIME-INR
INR: 1.04 (ref 0.00–1.49)
Prothrombin Time: 13.8 seconds (ref 11.6–15.2)

## 2011-04-10 LAB — TROPONIN I: Troponin I: <0.3 ng/mL (ref <0.30)

## 2011-05-29 NOTE — Consult Note (Signed)
NAMEAKITO, BOOMHOWER               ACCOUNT NO.:  [id number]  MEDICAL RECORD NO.:  [id number]  LOCATION:  [facility]                         FACILITY:  [facility]  PHYSICIAN:  Marca Ancona, MD      DATE OF BIRTH:  Apr 30, 1941  DATE OF CONSULTATION:  04/10/2011 DATE OF DISCHARGE:                                CONSULTATION   PRIMARY CARE PHYSICIAN:  Georgina Quint. [name], MD  PRIMARY CARDIOLOGIST:  Marca Ancona, MD  CHIEF COMPLAINT:  PAF.  HISTORY OF PRESENT ILLNESS:  Mr. Borum is a 70 year old male with no history of coronary artery disease.  He has a history of atrial fibrillation initially diagnosed in 2009.  He was awakened this morning at about 5 a.m. by leg cramps.  After he was awake, he noted chest discomfort.  He is unable to quantify or qualify it further.  Because of the chest discomfort,  he checked his pulse.  His blood pressure was slightly lower than usual and his heart rate was elevated at 102. A little while later it was 140.  He did not have true palpitations and was not otherwise aware that his heart rate was irregular except when he took his pulse.  He felt okay last p.m. and did not have any chest discomfort at that time although he did not check his blood pressure or heart rate.  With the elevation in his heart rate he came to the emergency room where he was in AFib RVR.  He was given IV Cardizem and spontaneously converted to sinus rhythm.  Once he was in sinus rhythm, the chest discomfort resolved.  Currently, he is resting comfortably. Mr. Arvie has been active around the house and yard without any symptoms and this past Tuesday had dental implants.  Except for the pain in his mouth from the dental implants, he is otherwise in his usual state of health and resting comfortably at this time.  PAST MEDICAL HISTORY: 1. History of PAF with rapid ventricular response, last documented     episode December 2011. 2. Status post echocardiogram in July 2011, that showed an  EF of 55-     60% with aortic stenosis, mean gradient 19 and peak gradient 36,     valve area 1.73 cm2 by V-max, PAS 34. 3. Status post exercise treadmill Myoview after his AFib in December     2011, showing no evidence of ischemia or infarction, EF 69%. 4. Hypertension. 5. Hyperlipidemia. 6. Reported history of hypothyroidism per discharge summary of 2010,     but the patient is unaware of this and no abnormal TSH is seen upon     review.  SURGICAL HISTORY:  He has had lumbar back surgery.  ALLERGIES:  No known drug allergies.  CURRENT MEDICATIONS: 1. Antibiotics secondary to his dental work but he is unaware of the     name. 2. Toprol XL 25 mg 1-1/2 tablets daily. 3. Benazepril/hydrochlorothiazide 20/12.5 a day. 4. Zocor 40 mg a day. 5. Aspirin 325 mg a day.  SOCIAL HISTORY:  He lives in [place] with his wife.  He still works as an Nature conservation officer.  He denies alcohol, tobacco or drug abuse. He is  a former Geophysicist/field seismologist.  FAMILY HISTORY:  His mother died at age 78 with no cardiac issues and he has 1 sister whom he does not believe has any cardiac issues.  His father died at 38 in [place].  REVIEW OF SYSTEMS:  He has dental pain.  Occasional arthralgias.  He has not had any illnesses, fevers, or chills.  The chest discomfort as described above.  He never gets palpitations, presyncope or syncope. Full 14-point review of systems is otherwise negative except as stated in the HPI.  PHYSICAL EXAMINATION:  VITAL SIGNS:  Temperature is 97.5, initial heart rate 116, now 57, respiratory rate 18, O2 saturation 98% on room air, blood pressure 115/81. GENERAL:  He is a well-developed, well-nourished white male in no acute distress. HEENT:  Normal. NECK:  There is no lymphadenopathy, thyromegaly, bruit, or JVD noted. CV:  His heart is regular in rate and rhythm with an S1-S2 and no critical murmur, rub or gallop is noted.  Distal pulses are intact in all four  extremities with a slightly decreased right DP. LUNGS:  Clear to auscultation bilaterally. SKIN:  No rashes or lesions are noted. ABDOMEN:  Soft and nontender with active bowel sounds. EXTREMITIES:  There is no cyanosis, clubbing or edema noted. MUSCULOSKELETAL:  There is no joint deformity or effusions and no spine or CVA tenderness. NEURO:  He is alert, oriented.  Cranial nerves II-XII grossly intact.  Chest x-ray no acute disease.  EKG initially AFib RVR rate 115 and now sinus bradycardia rate 55 with no acute ischemic changes.  LABORATORY VALUES:  Hemoglobin 14.5, hematocrit 42.7, WBCs 5.1, platelets 163, INR 1.04.  Sodium 141, potassium 3.7, chloride 104, CO2 29, BUN 12, creatinine 0.91, glucose 113.  Other CMET values within normal limits.  CK-MB 139/3.7, with a mildly elevated index of 2.7 and troponin I less than 0.30.  IMPRESSION:  Mr. Hoskin was seen today by Dr. Shirlee Latch, the patient evaluated and the data reviewed.  He had a typical episode for him of atrial fibrillation with rapid ventricular response that was associated with mild chest tightness.  He had a negative Myoview after a similar episode in December 2011.  His labs are unremarkable.  He went back into sinus rhythm.  Since his CHAD-VASc score is 2, we will stop aspirin and start Xarelto 15 mg daily.  We will use 15 mg daily instead of 20 mg daily because we will also be starting Multaq.  His GFR is greater than 60 and this dose is appropriate.  We will also start dronedarone 400 mg p.o. b.i.d. with meals.  Although he has a normal GFR, we will lower the dose of Robaxin because of the dronedarone.  He will follow up in the office in a week for an EKG and a followup appointment with Dr. Shirlee Latch.  At that time, consideration can be given to rechecking a TSH and obtaining an event monitor p.r.n.     Theodore Demark, PA-C   ______________________________ Marca Ancona, MD    RB/MEDQ  D:  04/10/2011  T:   04/10/2011  Job:  [id number]  Electronically Signed by Theodore Demark PA-C on 04/20/2011 06:39:37 AM Electronically Signed by Marca Ancona MD on 05/29/2011 10:18:22 PM

## 2011-06-16 LAB — CBC
HCT: 46
HCT: 47.7
Hemoglobin: 15.7
Hemoglobin: 16.1
MCHC: 33.7
MCHC: 34
MCV: 91.7
MCV: 91.9
Platelets: 184
Platelets: 200
RBC: 5
RBC: 5.21
RDW: 12.8
RDW: 12.9
WBC: 7.3
WBC: 8.9

## 2011-06-16 LAB — POCT CARDIAC MARKERS
CKMB, poc: 1.3
Myoglobin, poc: 75.6
Troponin i, poc: <0.05

## 2011-06-16 LAB — BASIC METABOLIC PANEL
BUN: 17
CO2: 27
Calcium: 9
Chloride: 106
Creatinine, Ser: 1.09
GFR calc Af Amer: >60
GFR calc non Af Amer: >60
Glucose, Bld: 105 — ABNORMAL HIGH
Potassium: 4.2
Sodium: 139

## 2011-06-16 LAB — PROTIME-INR
INR: 1
INR: 1
Prothrombin Time: 12.8
Prothrombin Time: 13.4

## 2011-06-16 LAB — POCT I-STAT, CHEM 8
BUN: 22
Calcium, Ion: 1.15
Chloride: 108
Creatinine, Ser: 1.2
Glucose, Bld: 126 — ABNORMAL HIGH
HCT: 52
Hemoglobin: 17.7 — ABNORMAL HIGH
Potassium: 4
Sodium: 141
TCO2: 24

## 2011-06-16 LAB — LIPID PANEL
Cholesterol: 198
HDL: 27 — ABNORMAL LOW
LDL Cholesterol: 140 — ABNORMAL HIGH
Total CHOL/HDL Ratio: 7.3
Triglycerides: 154 — ABNORMAL HIGH
VLDL: 31

## 2011-06-16 LAB — MAGNESIUM
Magnesium: 2.3
Magnesium: 2.3

## 2011-06-16 LAB — CARDIAC PANEL(CRET KIN+CKTOT+MB+TROPI)
CK, MB: 4.5 — ABNORMAL HIGH
CK, MB: 5.6 — ABNORMAL HIGH
Relative Index: 1.8
Relative Index: 1.9
Total CK: 257 — ABNORMAL HIGH
Total CK: 289 — ABNORMAL HIGH
Troponin I: 0.01
Troponin I: 0.01

## 2011-06-16 LAB — TSH: TSH: 4.926 — ABNORMAL HIGH

## 2011-06-16 LAB — APTT: aPTT: 27

## 2011-06-16 LAB — HEPARIN LEVEL (UNFRACTIONATED): Heparin Unfractionated: 0.65

## 2011-08-10 ENCOUNTER — Other Ambulatory Visit (INDEPENDENT_AMBULATORY_CARE_PROVIDER_SITE_OTHER): Payer: BC Managed Care – PPO

## 2011-08-10 DIAGNOSIS — R739 Hyperglycemia, unspecified: Secondary | ICD-10-CM

## 2011-08-10 DIAGNOSIS — R972 Elevated prostate specific antigen [PSA]: Secondary | ICD-10-CM

## 2011-08-10 DIAGNOSIS — R7309 Other abnormal glucose: Secondary | ICD-10-CM

## 2011-08-10 DIAGNOSIS — E785 Hyperlipidemia, unspecified: Secondary | ICD-10-CM

## 2011-08-10 LAB — LIPID PANEL
Cholesterol: 133 mg/dL (ref 0–200)
HDL: 35.5 mg/dL — ABNORMAL LOW (ref >39.00)
LDL Cholesterol: 77 mg/dL (ref 0–99)
Total CHOL/HDL Ratio: 4
Triglycerides: 102 mg/dL (ref 0.0–149.0)
VLDL: 20.4 mg/dL (ref 0.0–40.0)

## 2011-08-10 LAB — COMPREHENSIVE METABOLIC PANEL
ALT: 34 U/L (ref 0–53)
AST: 26 U/L (ref 0–37)
Albumin: 4 g/dL (ref 3.5–5.2)
Alkaline Phosphatase: 73 U/L (ref 39–117)
BUN: 19 mg/dL (ref 6–23)
CO2: 28 mEq/L (ref 19–32)
Calcium: 9.3 mg/dL (ref 8.4–10.5)
Chloride: 106 mEq/L (ref 96–112)
Creatinine, Ser: 0.9 mg/dL (ref 0.4–1.5)
GFR: 88.3 mL/min (ref >60.00)
Glucose, Bld: 109 mg/dL — ABNORMAL HIGH (ref 70–99)
Potassium: 3.5 mEq/L (ref 3.5–5.1)
Sodium: 143 mEq/L (ref 135–145)
Total Bilirubin: 0.9 mg/dL (ref 0.3–1.2)
Total Protein: 7 g/dL (ref 6.0–8.3)

## 2011-08-10 LAB — HEMOGLOBIN A1C: Hgb A1c MFr Bld: 5.9 % (ref 4.6–6.5)

## 2011-08-10 LAB — PSA: PSA: 2.09 ng/mL (ref 0.10–4.00)

## 2011-08-13 ENCOUNTER — Encounter: Payer: Self-pay | Admitting: Internal Medicine

## 2011-08-13 ENCOUNTER — Ambulatory Visit (INDEPENDENT_AMBULATORY_CARE_PROVIDER_SITE_OTHER): Payer: BC Managed Care – PPO | Admitting: Internal Medicine

## 2011-08-13 DIAGNOSIS — E785 Hyperlipidemia, unspecified: Secondary | ICD-10-CM

## 2011-08-13 DIAGNOSIS — I4891 Unspecified atrial fibrillation: Secondary | ICD-10-CM

## 2011-08-13 DIAGNOSIS — I10 Essential (primary) hypertension: Secondary | ICD-10-CM

## 2011-08-13 MED ORDER — VITAMIN D 1000 UNITS PO TABS: 1000.0000 [IU] | ORAL_TABLET | Freq: Every day | ORAL | Status: DC

## 2011-08-13 MED ORDER — AMOXICILLIN 500 MG PO CAPS: 1000.0000 mg | ORAL_CAPSULE | Freq: Two times a day (BID) | ORAL | Status: AC

## 2011-08-13 MED ORDER — TRIAMCINOLONE ACETONIDE 0.5 % EX CREA: 1.0000 "application " | TOPICAL_CREAM | Freq: Two times a day (BID) | CUTANEOUS | Status: DC | PRN

## 2011-08-13 NOTE — Assessment & Plan Note (Signed)
Continue with current prescription therapy as reflected on the Med list.  

## 2011-08-13 NOTE — Progress Notes (Signed)
  Subjective:    Patient ID: [name] [name], male    DOB: 02-Jun-1941, 70 y.o.   MRN: [id number]  HPI  F/u HTN and palpitations. C/o side effect with meds  C/o sinusitis sx x 1 wk,   Review of Systems  Constitutional: Positive for fatigue. Negative for appetite change and unexpected weight change.  HENT: Positive for sneezing and sinus pressure. Negative for nosebleeds, congestion, sore throat, trouble swallowing and neck pain.   Eyes: Negative for itching and visual disturbance.  Respiratory: Negative for cough.   Cardiovascular: Negative for chest pain, palpitations and leg swelling.  Gastrointestinal: Negative for nausea, diarrhea, blood in stool and abdominal distention.  Genitourinary: Negative for frequency and hematuria.  Musculoskeletal: Negative for back pain, joint swelling and gait problem.  Skin: Negative for rash.  Neurological: Negative for dizziness, tremors, speech difficulty and weakness.  Psychiatric/Behavioral: Negative for sleep disturbance, dysphoric mood and agitation. The patient is not nervous/anxious.        Objective:   Physical Exam  Constitutional: He is oriented to person, place, and time. He appears well-developed.  HENT:  Mouth/Throat: Oropharynx is clear and moist.  Eyes: Conjunctivae are normal. Pupils are equal, round, and reactive to light.  Neck: Normal range of motion. No JVD present. No thyromegaly present.  Cardiovascular: Normal rate, regular rhythm, normal heart sounds and intact distal pulses.  Exam reveals no gallop and no friction rub.   No murmur heard. Pulmonary/Chest: Effort normal and breath sounds normal. No respiratory distress. He has no wheezes. He has no rales. He exhibits no tenderness.  Abdominal: Soft. Bowel sounds are normal. He exhibits no distension and no mass. There is no tenderness. There is no rebound and no guarding.  Musculoskeletal: Normal range of motion. He exhibits no edema and no tenderness.  Lymphadenopathy:   He has no cervical adenopathy.  Neurological: He is alert and oriented to person, place, and time. He has normal reflexes. No cranial nerve deficit. He exhibits normal muscle tone. Coordination normal.  Skin: Skin is warm and dry. No rash noted.  Psychiatric: He has a normal mood and affect. His behavior is normal. Judgment and thought content normal.          Assessment & Plan:

## 2011-08-13 NOTE — Assessment & Plan Note (Signed)
Continue with current prescription therapy as reflected on the Med list. F/u with Dr Shirlee Latch

## 2012-02-02 ENCOUNTER — Ambulatory Visit (INDEPENDENT_AMBULATORY_CARE_PROVIDER_SITE_OTHER): Payer: BC Managed Care – PPO | Admitting: Internal Medicine

## 2012-02-02 ENCOUNTER — Encounter: Payer: Self-pay | Admitting: Internal Medicine

## 2012-02-02 ENCOUNTER — Other Ambulatory Visit (INDEPENDENT_AMBULATORY_CARE_PROVIDER_SITE_OTHER): Payer: BC Managed Care – PPO

## 2012-02-02 VITALS — BP 150/88 | HR 80 | Temp 97.3°F | Resp 16 | Wt 208.0 lb

## 2012-02-02 DIAGNOSIS — Z23 Encounter for immunization: Secondary | ICD-10-CM

## 2012-02-02 DIAGNOSIS — E785 Hyperlipidemia, unspecified: Secondary | ICD-10-CM

## 2012-02-02 DIAGNOSIS — I10 Essential (primary) hypertension: Secondary | ICD-10-CM

## 2012-02-02 DIAGNOSIS — I4891 Unspecified atrial fibrillation: Secondary | ICD-10-CM

## 2012-02-02 DIAGNOSIS — Z2911 Encounter for prophylactic immunotherapy for respiratory syncytial virus (RSV): Secondary | ICD-10-CM

## 2012-02-02 DIAGNOSIS — I359 Nonrheumatic aortic valve disorder, unspecified: Secondary | ICD-10-CM

## 2012-02-02 LAB — BASIC METABOLIC PANEL
BUN: 16 mg/dL (ref 6–23)
CO2: 29 mEq/L (ref 19–32)
Calcium: 9.3 mg/dL (ref 8.4–10.5)
Chloride: 108 mEq/L (ref 96–112)
Creatinine, Ser: 1 mg/dL (ref 0.4–1.5)
GFR: 78.99 mL/min (ref >60.00)
Glucose, Bld: 100 mg/dL — ABNORMAL HIGH (ref 70–99)
Potassium: 3.9 mEq/L (ref 3.5–5.1)
Sodium: 144 mEq/L (ref 135–145)

## 2012-02-02 LAB — LIPID PANEL
Cholesterol: 109 mg/dL (ref 0–200)
HDL: 37.1 mg/dL — ABNORMAL LOW (ref >39.00)
LDL Cholesterol: 55 mg/dL (ref 0–99)
Total CHOL/HDL Ratio: 3
Triglycerides: 87 mg/dL (ref 0.0–149.0)
VLDL: 17.4 mg/dL (ref 0.0–40.0)

## 2012-02-02 LAB — HEPATIC FUNCTION PANEL
ALT: 21 U/L (ref 0–53)
AST: 26 U/L (ref 0–37)
Albumin: 4.2 g/dL (ref 3.5–5.2)
Alkaline Phosphatase: 63 U/L (ref 39–117)
Bilirubin, Direct: 0.1 mg/dL (ref 0.0–0.3)
Total Bilirubin: 0.8 mg/dL (ref 0.3–1.2)
Total Protein: 7.4 g/dL (ref 6.0–8.3)

## 2012-02-02 LAB — TSH: TSH: 1.94 u[IU]/mL (ref 0.35–5.50)

## 2012-02-02 MED ORDER — OXICONAZOLE NITRATE 1 % EX CREA: TOPICAL_CREAM | CUTANEOUS | Status: DC

## 2012-02-02 MED ORDER — METOPROLOL SUCCINATE ER 25 MG PO TB24: 25.0000 mg | ORAL_TABLET | Freq: Two times a day (BID) | ORAL | Status: DC

## 2012-02-02 NOTE — Patient Instructions (Signed)
BP Readings from Last 3 Encounters:  02/02/12 150/88  08/13/11 130/60  02/10/11 158/82   Wt Readings from Last 3 Encounters:  02/02/12 208 lb (94.348 kg)  08/13/11 209 lb (94.802 kg)  02/10/11 213 lb (96.616 kg)

## 2012-02-02 NOTE — Progress Notes (Signed)
Patient ID: [name] [name], male   DOB: July 06, 1941, 71 y.o.   MRN: [id number]  Subjective:    Patient ID: [name] [name], male    DOB: 11/01/1940, 71 y.o.   MRN: [id number]  HPI  F/u HTN and palpitations. C/o side effect with meds  BP Readings from Last 3 Encounters:  02/02/12 150/88  08/13/11 130/60  02/10/11 158/82   Wt Readings from Last 3 Encounters:  02/02/12 208 lb (94.348 kg)  08/13/11 209 lb (94.802 kg)  02/10/11 213 lb (96.616 kg)      Review of Systems  Constitutional: Positive for fatigue. Negative for appetite change and unexpected weight change.  HENT: Positive for sneezing and sinus pressure. Negative for nosebleeds, congestion, sore throat, trouble swallowing and neck pain.   Eyes: Negative for itching and visual disturbance.  Respiratory: Negative for cough.   Cardiovascular: Negative for chest pain, palpitations and leg swelling.  Gastrointestinal: Negative for nausea, diarrhea, blood in stool and abdominal distention.  Genitourinary: Negative for frequency and hematuria.  Musculoskeletal: Negative for back pain, joint swelling and gait problem.  Skin: Negative for rash.  Neurological: Negative for dizziness, tremors, speech difficulty and weakness.  Psychiatric/Behavioral: Negative for sleep disturbance, dysphoric mood and agitation. The patient is not nervous/anxious.        Objective:   Physical Exam  Constitutional: He is oriented to person, place, and time. He appears well-developed.  HENT:  Mouth/Throat: Oropharynx is clear and moist.  Eyes: Conjunctivae are normal. Pupils are equal, round, and reactive to light.  Neck: Normal range of motion. No JVD present. No thyromegaly present.  Cardiovascular: Normal rate, regular rhythm, normal heart sounds and intact distal pulses.  Exam reveals no gallop and no friction rub.   No murmur heard. Pulmonary/Chest: Effort normal and breath sounds normal. No respiratory distress. He has no wheezes. He has no rales.  He exhibits no tenderness.  Abdominal: Soft. Bowel sounds are normal. He exhibits no distension and no mass. There is no tenderness. There is no rebound and no guarding.  Musculoskeletal: Normal range of motion. He exhibits no edema and no tenderness.  Lymphadenopathy:    He has no cervical adenopathy.  Neurological: He is alert and oriented to person, place, and time. He has normal reflexes. No cranial nerve deficit. He exhibits normal muscle tone. Coordination normal.  Skin: Skin is warm and dry. No rash noted.  Psychiatric: He has a normal mood and affect. His behavior is normal. Judgment and thought content normal.   Lab Results  Component Value Date   WBC 5.1 04/10/2011   HGB 14.5 04/10/2011   HCT 42.7 04/10/2011   PLT 163 04/10/2011   GLUCOSE 109* 08/10/2011   CHOL 133 08/10/2011   TRIG 102.0 08/10/2011   HDL 35.50* 08/10/2011   LDLDIRECT 151.5 11/20/2007   LDLCALC 77 08/10/2011   ALT 34 08/10/2011   AST 26 08/10/2011   NA 143 08/10/2011   K 3.5 08/10/2011   CL 106 08/10/2011   CREATININE 0.9 08/10/2011   BUN 19 08/10/2011   CO2 28 08/10/2011   TSH 2.25 02/04/2011   PSA 2.09 08/10/2011   INR 1.04 04/10/2011   HGBA1C 5.9 08/10/2011          Assessment & Plan:

## 2012-02-02 NOTE — Assessment & Plan Note (Signed)
Continue with current prescription therapy as reflected on the Med list.  

## 2012-02-02 NOTE — Assessment & Plan Note (Signed)
Chronic, sub-optimal control Continue with current prescription therapy as reflected on the Med list.

## 2012-02-02 NOTE — Assessment & Plan Note (Signed)
Continue with current prescription therapy as reflected on the Med list. We will need to repeat ECHO

## 2012-02-02 NOTE — Progress Notes (Signed)
Addended by: Merrilyn Puma on: 02/02/2012 09:24 AM   Modules accepted: Orders

## 2012-02-10 ENCOUNTER — Ambulatory Visit: Payer: BC Managed Care – PPO | Admitting: Internal Medicine

## 2012-03-31 ENCOUNTER — Other Ambulatory Visit: Payer: Self-pay | Admitting: *Deleted

## 2012-03-31 MED ORDER — METOPROLOL SUCCINATE ER 25 MG PO TB24: 25.0000 mg | ORAL_TABLET | Freq: Two times a day (BID) | ORAL | Status: DC

## 2012-03-31 MED ORDER — SIMVASTATIN 40 MG PO TABS: 40.0000 mg | ORAL_TABLET | Freq: Every day | ORAL | Status: DC

## 2012-03-31 MED ORDER — BENAZEPRIL-HYDROCHLOROTHIAZIDE 20-25 MG PO TABS: 1.0000 | ORAL_TABLET | Freq: Every day | ORAL | Status: DC

## 2012-04-11 ENCOUNTER — Emergency Department (HOSPITAL_COMMUNITY): Payer: No Typology Code available for payment source

## 2012-04-11 ENCOUNTER — Emergency Department (HOSPITAL_COMMUNITY)
Admission: EM | Admit: 2012-04-11 | Discharge: 2012-04-11 | Disposition: A | Discharge status: Home / Self Care | Payer: No Typology Code available for payment source | Attending: Emergency Medicine | Admitting: Emergency Medicine

## 2012-04-11 DIAGNOSIS — S60222A Contusion of left hand, initial encounter: Secondary | ICD-10-CM

## 2012-04-11 DIAGNOSIS — Y92410 Unspecified street and highway as the place of occurrence of the external cause: Secondary | ICD-10-CM | POA: Insufficient documentation

## 2012-04-11 DIAGNOSIS — E7800 Pure hypercholesterolemia, unspecified: Secondary | ICD-10-CM | POA: Insufficient documentation

## 2012-04-11 DIAGNOSIS — I359 Nonrheumatic aortic valve disorder, unspecified: Secondary | ICD-10-CM | POA: Insufficient documentation

## 2012-04-11 DIAGNOSIS — S60229A Contusion of unspecified hand, initial encounter: Secondary | ICD-10-CM | POA: Insufficient documentation

## 2012-04-11 DIAGNOSIS — R079 Chest pain, unspecified: Secondary | ICD-10-CM | POA: Insufficient documentation

## 2012-04-11 DIAGNOSIS — Z7982 Long term (current) use of aspirin: Secondary | ICD-10-CM | POA: Insufficient documentation

## 2012-04-11 DIAGNOSIS — I4891 Unspecified atrial fibrillation: Secondary | ICD-10-CM | POA: Insufficient documentation

## 2012-04-11 DIAGNOSIS — I10 Essential (primary) hypertension: Secondary | ICD-10-CM | POA: Insufficient documentation

## 2012-04-11 DIAGNOSIS — Z79899 Other long term (current) drug therapy: Secondary | ICD-10-CM | POA: Insufficient documentation
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
## 2012-04-11 NOTE — ED Provider Notes (Signed)
Medical screening examination/treatment/procedure(s) were performed by non-physician practitioner and as supervising physician I was immediately available for consultation/collaboration.   Gavin Pound. Mahreen Schewe, MD 04/11/12 2356
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
## 2012-04-21 ENCOUNTER — Ambulatory Visit (INDEPENDENT_AMBULATORY_CARE_PROVIDER_SITE_OTHER)
Admission: RE | Admit: 2012-04-21 | Discharge: 2012-04-21 | Disposition: A | Discharge status: Home / Self Care | Payer: BC Managed Care – PPO | Source: Ambulatory Visit | Attending: Internal Medicine | Admitting: Internal Medicine

## 2012-04-21 ENCOUNTER — Ambulatory Visit (INDEPENDENT_AMBULATORY_CARE_PROVIDER_SITE_OTHER): Payer: BC Managed Care – PPO | Admitting: Internal Medicine

## 2012-04-21 ENCOUNTER — Telehealth: Payer: Self-pay | Admitting: Internal Medicine

## 2012-04-21 ENCOUNTER — Encounter: Payer: Self-pay | Admitting: Internal Medicine

## 2012-04-21 ENCOUNTER — Other Ambulatory Visit (INDEPENDENT_AMBULATORY_CARE_PROVIDER_SITE_OTHER): Payer: BC Managed Care – PPO

## 2012-04-21 VITALS — BP 150/84 | HR 84 | Temp 97.5°F | Resp 16 | Wt 206.0 lb

## 2012-04-21 DIAGNOSIS — R079 Chest pain, unspecified: Secondary | ICD-10-CM

## 2012-04-21 DIAGNOSIS — R0989 Other specified symptoms and signs involving the circulatory and respiratory systems: Secondary | ICD-10-CM

## 2012-04-21 DIAGNOSIS — R0609 Other forms of dyspnea: Secondary | ICD-10-CM

## 2012-04-21 DIAGNOSIS — R0602 Shortness of breath: Secondary | ICD-10-CM

## 2012-04-21 DIAGNOSIS — R0600 Dyspnea, unspecified: Secondary | ICD-10-CM

## 2012-04-21 LAB — CBC WITH DIFFERENTIAL/PLATELET
Basophils Absolute: 0 10*3/uL (ref 0.0–0.1)
Basophils Relative: 0.5 % (ref 0.0–3.0)
Eosinophils Absolute: 0.2 10*3/uL (ref 0.0–0.7)
Eosinophils Relative: 3.6 % (ref 0.0–5.0)
HCT: 45.3 % (ref 39.0–52.0)
Hemoglobin: 15 g/dL (ref 13.0–17.0)
Lymphocytes Relative: 19.4 % (ref 12.0–46.0)
Lymphs Abs: 1.3 10*3/uL (ref 0.7–4.0)
MCHC: 33.1 g/dL (ref 30.0–36.0)
MCV: 92.4 fl (ref 78.0–100.0)
Monocytes Absolute: 0.7 10*3/uL (ref 0.1–1.0)
Monocytes Relative: 10 % (ref 3.0–12.0)
Neutro Abs: 4.4 10*3/uL (ref 1.4–7.7)
Neutrophils Relative %: 66.5 % (ref 43.0–77.0)
Platelets: 195 10*3/uL (ref 150.0–400.0)
RBC: 4.9 Mil/uL (ref 4.22–5.81)
RDW: 12.9 % (ref 11.5–14.6)
WBC: 6.6 10*3/uL (ref 4.5–10.5)

## 2012-04-21 LAB — BASIC METABOLIC PANEL
BUN: 16 mg/dL (ref 6–23)
CO2: 33 mEq/L — ABNORMAL HIGH (ref 19–32)
Calcium: 9.6 mg/dL (ref 8.4–10.5)
Chloride: 102 mEq/L (ref 96–112)
Creatinine, Ser: 0.9 mg/dL (ref 0.4–1.5)
GFR: 92.87 mL/min (ref >60.00)
Glucose, Bld: 96 mg/dL (ref 70–99)
Potassium: 4.2 mEq/L (ref 3.5–5.1)
Sodium: 141 mEq/L (ref 135–145)

## 2012-04-21 LAB — CK: Total CK: 242 U/L — ABNORMAL HIGH (ref 7–232)

## 2012-04-21 LAB — CREATININE KINASE MB: CK-MB: 4.4 ng/mL — ABNORMAL HIGH (ref 0.3–4.0)

## 2012-04-21 LAB — D-DIMER, QUANTITATIVE: D-Dimer, Quant: 0.57 ug/mL-FEU — ABNORMAL HIGH (ref 0.00–0.48)

## 2012-04-21 MED ORDER — IOHEXOL 350 MG/ML SOLN
80.0000 mL | Freq: Once | INTRAVENOUS | Status: AC | PRN
  Administered 2012-04-21: 80 mL via INTRAVENOUS

## 2012-04-21 MED ORDER — TRAMADOL HCL 50 MG PO TABS: 50.0000 mg | ORAL_TABLET | Freq: Two times a day (BID) | ORAL | Status: AC | PRN
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
## 2012-04-21 NOTE — Telephone Encounter (Signed)
Pt was informed of CT, lab results He will rest To ER if worse Do not go to work for a few more days

## 2012-04-22 ENCOUNTER — Encounter: Payer: Self-pay | Admitting: Internal Medicine

## 2012-04-27 ENCOUNTER — Telehealth: Payer: Self-pay | Admitting: Internal Medicine

## 2012-04-27 NOTE — Telephone Encounter (Signed)
Pt requested note to return to work on 05/01/12 approved per Dr. Posey Rea. Letter provided to patient on 04/27/12.

## 2012-05-31 ENCOUNTER — Ambulatory Visit (INDEPENDENT_AMBULATORY_CARE_PROVIDER_SITE_OTHER): Payer: BC Managed Care – PPO | Admitting: Internal Medicine

## 2012-05-31 ENCOUNTER — Encounter: Payer: Self-pay | Admitting: Internal Medicine

## 2012-05-31 VITALS — BP 140/84 | HR 72 | Temp 97.5°F | Resp 16 | Ht 71.0 in | Wt 206.5 lb

## 2012-05-31 DIAGNOSIS — E785 Hyperlipidemia, unspecified: Secondary | ICD-10-CM

## 2012-05-31 DIAGNOSIS — Z0000 Encounter for general adult medical examination without abnormal findings: Secondary | ICD-10-CM

## 2012-05-31 DIAGNOSIS — I10 Essential (primary) hypertension: Secondary | ICD-10-CM

## 2012-05-31 NOTE — Patient Instructions (Signed)
Stretch lower back and your hamstrings, hips

## 2012-05-31 NOTE — Assessment & Plan Note (Signed)
Continue with current prescription therapy as reflected on the Med list.  

## 2012-05-31 NOTE — Assessment & Plan Note (Signed)
We discussed age appropriate health related issues, including available/recomended screening tests and vaccinations. We discussed a need for adhering to healthy diet and exercise. Labs/EKG were reviewed/ordered. All questions were answered.   
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
## 2012-09-26 ENCOUNTER — Other Ambulatory Visit (INDEPENDENT_AMBULATORY_CARE_PROVIDER_SITE_OTHER): Payer: PRIVATE HEALTH INSURANCE

## 2012-09-26 DIAGNOSIS — E785 Hyperlipidemia, unspecified: Secondary | ICD-10-CM

## 2012-09-26 DIAGNOSIS — Z0000 Encounter for general adult medical examination without abnormal findings: Secondary | ICD-10-CM

## 2012-09-26 LAB — BASIC METABOLIC PANEL
BUN: 22 mg/dL (ref 6–23)
CO2: 30 mEq/L (ref 19–32)
Calcium: 9.2 mg/dL (ref 8.4–10.5)
Chloride: 103 mEq/L (ref 96–112)
Creatinine, Ser: 1 mg/dL (ref 0.4–1.5)
GFR: 78.85 mL/min (ref >60.00)
Glucose, Bld: 102 mg/dL — ABNORMAL HIGH (ref 70–99)
Potassium: 3.8 mEq/L (ref 3.5–5.1)
Sodium: 140 mEq/L (ref 135–145)

## 2012-09-26 LAB — CBC WITH DIFFERENTIAL/PLATELET
Basophils Absolute: 0 10*3/uL (ref 0.0–0.1)
Basophils Relative: 0.4 % (ref 0.0–3.0)
Eosinophils Absolute: 0.3 10*3/uL (ref 0.0–0.7)
Eosinophils Relative: 5 % (ref 0.0–5.0)
HCT: 43.8 % (ref 39.0–52.0)
Hemoglobin: 14.8 g/dL (ref 13.0–17.0)
Lymphocytes Relative: 28 % (ref 12.0–46.0)
Lymphs Abs: 1.6 10*3/uL (ref 0.7–4.0)
MCHC: 33.7 g/dL (ref 30.0–36.0)
MCV: 90.2 fl (ref 78.0–100.0)
Monocytes Absolute: 0.6 10*3/uL (ref 0.1–1.0)
Monocytes Relative: 9.9 % (ref 3.0–12.0)
Neutro Abs: 3.2 10*3/uL (ref 1.4–7.7)
Neutrophils Relative %: 56.7 % (ref 43.0–77.0)
Platelets: 158 10*3/uL (ref 150.0–400.0)
RBC: 4.86 Mil/uL (ref 4.22–5.81)
RDW: 13.2 % (ref 11.5–14.6)
WBC: 5.6 10*3/uL (ref 4.5–10.5)

## 2012-09-26 LAB — URINALYSIS
Bilirubin Urine: NEGATIVE
Hgb urine dipstick: NEGATIVE
Ketones, ur: NEGATIVE
Leukocytes, UA: NEGATIVE
Nitrite: NEGATIVE
Specific Gravity, Urine: 1.015 (ref 1.000–1.030)
Total Protein, Urine: NEGATIVE
Urine Glucose: NEGATIVE
Urobilinogen, UA: 0.2 (ref 0.0–1.0)
pH: 6.5 (ref 5.0–8.0)

## 2012-09-26 LAB — LIPID PANEL
Cholesterol: 133 mg/dL (ref 0–200)
HDL: 37 mg/dL — ABNORMAL LOW (ref >39.00)
LDL Cholesterol: 76 mg/dL (ref 0–99)
Total CHOL/HDL Ratio: 4
Triglycerides: 100 mg/dL (ref 0.0–149.0)
VLDL: 20 mg/dL (ref 0.0–40.0)

## 2012-09-26 LAB — HEPATIC FUNCTION PANEL
ALT: 28 U/L (ref 0–53)
AST: 27 U/L (ref 0–37)
Albumin: 4.3 g/dL (ref 3.5–5.2)
Alkaline Phosphatase: 55 U/L (ref 39–117)
Bilirubin, Direct: 0.1 mg/dL (ref 0.0–0.3)
Total Bilirubin: 0.8 mg/dL (ref 0.3–1.2)
Total Protein: 7.1 g/dL (ref 6.0–8.3)

## 2012-09-26 LAB — TSH: TSH: 3.77 u[IU]/mL (ref 0.35–5.50)

## 2012-09-26 LAB — PSA: PSA: 1.94 ng/mL (ref 0.10–4.00)

## 2012-09-28 ENCOUNTER — Encounter: Payer: Self-pay | Admitting: Internal Medicine

## 2012-09-28 ENCOUNTER — Ambulatory Visit (INDEPENDENT_AMBULATORY_CARE_PROVIDER_SITE_OTHER): Payer: BC Managed Care – PPO | Admitting: Internal Medicine

## 2012-09-28 VITALS — BP 170/90 | HR 80 | Temp 97.0°F | Resp 16 | Wt 215.0 lb

## 2012-09-28 DIAGNOSIS — I6529 Occlusion and stenosis of unspecified carotid artery: Secondary | ICD-10-CM

## 2012-09-28 DIAGNOSIS — I10 Essential (primary) hypertension: Secondary | ICD-10-CM

## 2012-09-28 DIAGNOSIS — M1990 Unspecified osteoarthritis, unspecified site: Secondary | ICD-10-CM

## 2012-09-28 DIAGNOSIS — I4891 Unspecified atrial fibrillation: Secondary | ICD-10-CM

## 2012-09-28 DIAGNOSIS — R079 Chest pain, unspecified: Secondary | ICD-10-CM

## 2012-09-28 DIAGNOSIS — E785 Hyperlipidemia, unspecified: Secondary | ICD-10-CM

## 2012-09-28 MED ORDER — BENAZEPRIL HCL 40 MG PO TABS: 40.0000 mg | ORAL_TABLET | Freq: Every day | ORAL | Status: DC

## 2012-09-28 MED ORDER — TRIAMTERENE-HCTZ 37.5-25 MG PO TABS: 1.0000 | ORAL_TABLET | Freq: Every day | ORAL | Status: DC

## 2012-09-28 NOTE — Assessment & Plan Note (Addendum)
Increase Lotensin to 40 mg a day Change to Maxzide

## 2012-09-28 NOTE — Assessment & Plan Note (Signed)
Continue with current prescription therapy as reflected on the Med list.  
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
## 2012-09-28 NOTE — Assessment & Plan Note (Signed)
Better  

## 2012-10-10 ENCOUNTER — Encounter: Payer: Self-pay | Admitting: Internal Medicine

## 2012-10-10 ENCOUNTER — Ambulatory Visit (INDEPENDENT_AMBULATORY_CARE_PROVIDER_SITE_OTHER): Payer: BC Managed Care – PPO | Admitting: Internal Medicine

## 2012-10-10 VITALS — BP 150/60 | HR 76 | Temp 97.1°F | Resp 16 | Wt 211.0 lb

## 2012-10-10 DIAGNOSIS — R0609 Other forms of dyspnea: Secondary | ICD-10-CM

## 2012-10-10 DIAGNOSIS — R059 Cough, unspecified: Secondary | ICD-10-CM | POA: Insufficient documentation

## 2012-10-10 DIAGNOSIS — R05 Cough: Secondary | ICD-10-CM | POA: Insufficient documentation

## 2012-10-10 DIAGNOSIS — R0600 Dyspnea, unspecified: Secondary | ICD-10-CM

## 2012-10-10 DIAGNOSIS — I10 Essential (primary) hypertension: Secondary | ICD-10-CM

## 2012-10-10 MED ORDER — VALSARTAN 320 MG PO TABS: 320.0000 mg | ORAL_TABLET | Freq: Every day | ORAL | Status: DC

## 2012-10-10 MED ORDER — PROMETHAZINE-CODEINE 6.25-10 MG/5ML PO SYRP: 5.0000 mL | ORAL_SOLUTION | ORAL | Status: AC | PRN

## 2012-10-10 MED ORDER — AZITHROMYCIN 250 MG PO TABS: ORAL_TABLET | ORAL | Status: DC

## 2012-10-10 MED ORDER — FLUTICASONE FUROATE-VILANTEROL 100-25 MCG/INH IN AEPB: 1.0000 | INHALATION_SPRAY | Freq: Every day | RESPIRATORY_TRACT | Status: DC

## 2012-10-10 NOTE — Progress Notes (Signed)
Subjective:    Cough This is a new problem. The current episode started in the past 7 days. The problem has been waxing and waning. The problem occurs constantly. The cough is productive of sputum. Pertinent negatives include no chest pain, chills, rash or sore throat. The symptoms are aggravated by cold air. The treatment provided no relief. There is no history of asthma, COPD or pneumonia.    . C/o SOB, cough F/u HTN and palpitations.   BP Readings from Last 3 Encounters:  10/10/12 150/60  09/28/12 170/90  05/31/12 140/84   Wt Readings from Last 3 Encounters:  10/10/12 211 lb (95.709 kg)  09/28/12 215 lb (97.523 kg)  05/31/12 206 lb 8 oz (93.668 kg)      Review of Systems  Constitutional: Negative for chills, appetite change, fatigue and unexpected weight change.  HENT: Negative for nosebleeds, congestion, sore throat, sneezing, trouble swallowing, neck pain and sinus pressure.   Eyes: Negative for itching and visual disturbance.  Respiratory: Positive for cough.   Cardiovascular: Negative for chest pain, palpitations and leg swelling.  Gastrointestinal: Negative for nausea, diarrhea, constipation, blood in stool and abdominal distention.  Genitourinary: Negative for dysuria, urgency, frequency, hematuria, decreased urine volume, scrotal swelling, enuresis and testicular pain.  Musculoskeletal: Negative for back pain, joint swelling and gait problem.  Skin: Negative for rash.  Neurological: Negative for dizziness, tremors, facial asymmetry, speech difficulty, weakness and light-headedness.  Psychiatric/Behavioral: Negative for sleep disturbance, self-injury, dysphoric mood and agitation. The patient is not nervous/anxious.        Objective:   Physical Exam  Constitutional: He is oriented to person, place, and time. He appears well-developed and well-nourished.  HENT:  Mouth/Throat: Oropharynx is clear and moist.  Eyes: Conjunctivae normal are normal. Pupils are  equal, round, and reactive to light.  Neck: Normal range of motion. No JVD present. No thyromegaly present.  Cardiovascular: Normal rate, regular rhythm, normal heart sounds and intact distal pulses.  Exam reveals no gallop and no friction rub.   No murmur heard. Pulmonary/Chest: Effort normal and breath sounds normal. No respiratory distress. He has no wheezes. He has no rales. He exhibits no tenderness.  Abdominal: Soft. Bowel sounds are normal. He exhibits no distension and no mass. There is no tenderness. There is no rebound and no guarding.  Genitourinary: Rectum normal and prostate normal. Guaiac negative stool. No penile tenderness.  Musculoskeletal: Normal range of motion. He exhibits no edema and no tenderness.  Lymphadenopathy:    He has no cervical adenopathy.  Neurological: He is alert and oriented to person, place, and time. He has normal reflexes. No cranial nerve deficit. He exhibits normal muscle tone. Coordination normal.  Skin: Skin is warm and dry. No rash noted.  Psychiatric: He has a normal mood and affect. His behavior is normal. Judgment and thought content normal.  Coughing a lot... Lab Results  Component Value Date   WBC 5.6 09/26/2012   HGB 14.8 09/26/2012   HCT 43.8 09/26/2012   PLT 158.0 09/26/2012   GLUCOSE 102* 09/26/2012   CHOL 133 09/26/2012   TRIG 100.0 09/26/2012   HDL 37.00* 09/26/2012   LDLDIRECT 151.5 11/20/2007   LDLCALC 76 09/26/2012   ALT 28 09/26/2012   AST 27 09/26/2012   NA 140 09/26/2012   K 3.8 09/26/2012   CL 103 09/26/2012   CREATININE 1.0 09/26/2012   BUN 22 09/26/2012   CO2 30 09/26/2012   TSH 3.77 09/26/2012   PSA 1.94 09/26/2012  INR 1.04 04/10/2011   HGBA1C 5.9 08/10/2011     I personally provided the [facility] inhaler use teaching. After the teaching patient was able to demonstrate it's use effectively. All questions were answered      Assessment & Plan:

## 2012-10-10 NOTE — Assessment & Plan Note (Signed)
1/14 severe -- URI and ACE related  Breo D/c benazepril Prom-cod Zpac if worse

## 2012-10-10 NOTE — Assessment & Plan Note (Signed)
Prom-cod cough syr

## 2012-10-10 NOTE — Assessment & Plan Note (Signed)
D/c ACE Start Benazepril

## 2012-10-12 ENCOUNTER — Telehealth: Payer: Self-pay | Admitting: Internal Medicine

## 2012-10-12 NOTE — Telephone Encounter (Signed)
Voice rest - whispering is ok Mucinex bid, use cough syrup It takes time to resolve Air humidifier should help There is nothing for hoarsness by pescription Thx

## 2012-10-12 NOTE — Telephone Encounter (Signed)
[name] [name] was here Jan 28 for a cough.  Now he is very hoarse.  Can something be called in?

## 2012-10-12 NOTE — Telephone Encounter (Signed)
Informed pt .

## 2012-10-21 ENCOUNTER — Ambulatory Visit (INDEPENDENT_AMBULATORY_CARE_PROVIDER_SITE_OTHER): Payer: BC Managed Care – PPO | Admitting: Family

## 2012-10-21 ENCOUNTER — Ambulatory Visit (HOSPITAL_COMMUNITY)
Admission: RE | Admit: 2012-10-21 | Discharge: 2012-10-21 | Disposition: A | Discharge status: Home / Self Care | Payer: BC Managed Care – PPO | Source: Ambulatory Visit | Attending: Family | Admitting: Family

## 2012-10-21 ENCOUNTER — Telehealth: Payer: Self-pay | Admitting: Family

## 2012-10-21 ENCOUNTER — Encounter: Payer: Self-pay | Admitting: Family

## 2012-10-21 VITALS — BP 150/90 | HR 68 | Temp 97.8°F | Resp 17 | Wt 217.0 lb

## 2012-10-21 DIAGNOSIS — J4489 Other specified chronic obstructive pulmonary disease: Secondary | ICD-10-CM | POA: Insufficient documentation

## 2012-10-21 DIAGNOSIS — R0989 Other specified symptoms and signs involving the circulatory and respiratory systems: Secondary | ICD-10-CM | POA: Insufficient documentation

## 2012-10-21 DIAGNOSIS — R05 Cough: Secondary | ICD-10-CM

## 2012-10-21 DIAGNOSIS — R059 Cough, unspecified: Secondary | ICD-10-CM

## 2012-10-21 DIAGNOSIS — J449 Chronic obstructive pulmonary disease, unspecified: Secondary | ICD-10-CM | POA: Insufficient documentation

## 2012-10-21 DIAGNOSIS — I10 Essential (primary) hypertension: Secondary | ICD-10-CM

## 2012-10-21 DIAGNOSIS — J9819 Other pulmonary collapse: Secondary | ICD-10-CM | POA: Insufficient documentation

## 2012-10-21 MED ORDER — AZITHROMYCIN 250 MG PO TABS: ORAL_TABLET | ORAL | Status: DC

## 2012-10-21 MED ORDER — ALBUTEROL SULFATE HFA 108 (90 BASE) MCG/ACT IN AERS: 2.0000 | INHALATION_SPRAY | Freq: Four times a day (QID) | RESPIRATORY_TRACT | Status: DC | PRN

## 2012-10-21 MED ORDER — BENZONATATE 100 MG PO CAPS: 100.0000 mg | ORAL_CAPSULE | Freq: Three times a day (TID) | ORAL | Status: DC | PRN

## 2012-10-21 NOTE — Assessment & Plan Note (Signed)
72 yr old male with 2 week hx of cough and chest congestion, former smoker.  Will obtain cxr to exclude pneumonia.  Plan rx with zithromax, prn tessalon, and add an albuterol inhaler prn.  Pt is instructed to call if symptoms worsen or if no improvement in 2-3 days.

## 2012-10-21 NOTE — Progress Notes (Signed)
Subjective:    Patient ID: [name] [name], male    DOB: [birth year], 72 y.o.   MRN: [id number]  HPI  Mr. Rountree is a 72 yr old male who presents today with chief complaint of severe cough.  Cough has been present x 2 weeks and is productive of "Milky" mucous.  Reports that this morning he woke up with chest congestion and felt like he "could not get any air." He denies fever.  He has tried mucinex and otc cough syrup without much improvement.    Review of Systems    see HPI  Past Medical History  Diagnosis Date  . HTN (hypertension)   . A-fib     The Pt had new onset Afib in 07/2008. He underwent ibutilide cardioversion successfully. He has had 1-2 episodes/year that are short-lived that likely are  Afib w/RVR. CHADSVASC score 2.  . Hypercholesteremia   . Aortic stenosis, mild     Echo 07/11: 55-60%, mild LV hypertrophy, mild aortic stenosis w/mean gradient and peak gradient 36 mmHg.  . Osteoarthritis   . LBP (low back pain)   . Chest pain     ETT-myoview 12/11 w/exercise, no chest pain, no significant ST changes, EF 69%, no evidence for ischemia or infarction.    History   Social History  . Marital Status: Married    Spouse Name: N/A    Number of Children: N/A  . Years of Education: N/A   Occupational History  . 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 Smoking status: Never Smoker   . Smokeless tobacco: Not on file  . Alcohol Use: 0.0 oz/week     Comment: Drinks 1 glass of wine nightly.  . Drug Use: No  . Sexually Active: Yes   Other Topics Concern  . Not on file   Social History Narrative   Patient lives in [place]. He is a native of [place]. 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    Past Surgical History  Procedure Laterality Date  . Back surgery  x12 years ago  . Lumbar laminectomy    . Hemorrhoid surgery      Family History  Problem Relation Age of Onset  . Hypertension Other   .  Coronary artery disease Neg Hx   . Cancer Mother 69    colon    Allergies  Allergen Reactions  . Benazepril     cough    Current Outpatient Prescriptions on File Prior to Visit  Medication Sig Dispense Refill  . aspirin 325 MG tablet Take 325 mg by mouth daily.        . Cholecalciferol 1000 UNITS capsule Take 1,000 Units by mouth daily.      . metoprolol succinate (TOPROL-XL) 25 MG 24 hr tablet Take 37.5 mg by mouth daily.       . simvastatin (ZOCOR) 40 MG tablet Take 20 mg by mouth every evening.       . triamterene-hydrochlorothiazide (MAXZIDE-25) 37.5-25 MG per tablet Take 1 each (1 tablet total) by mouth daily.  30 tablet  11  . valsartan (DIOVAN) 320 MG tablet Take 1 tablet (320 mg total) by mouth daily.  30 tablet  11   No current facility-administered medications on file prior to visit.    BP 150/90  Pulse 68  Temp(Src) 97.8 F (36.6 C) (Oral)  Resp 17  Wt 217 lb (98.431 kg)  BMI 30.28 kg/m2  SpO2  97%    Objective:   Physical Exam  Constitutional: He appears well-developed and well-nourished. No distress.  HENT:  Head: Normocephalic and atraumatic.  Mouth/Throat: No oropharyngeal exudate.  Cardiovascular: Normal rate and regular rhythm.   Murmur heard. Pulmonary/Chest: Effort normal and breath sounds normal. No respiratory distress. He has no wheezes. He has no rales. He exhibits no tenderness.  Lymphadenopathy:    He has no cervical adenopathy.  Psychiatric: He has a normal mood and affect. His behavior is normal. Judgment and thought content normal.          Assessment & Plan:

## 2012-10-21 NOTE — Patient Instructions (Signed)
Please go to the first floor at [facility] to complete chest x ray.   Call if symptoms worsen, or if you are not feeling better in 2-3 days. Follow up with Dr. Posey Rea in 1 week.

## 2012-10-21 NOTE — Assessment & Plan Note (Signed)
BP remains elevated.  He is not taking medications. Recommended that he resume meds- he declines.  We discussed risks of untreated HTN.

## 2012-10-21 NOTE — Telephone Encounter (Signed)
Reviewed X ray- no definite pneumonia. Could be early RLL pneumonia.  Recommend z-pak and follow up as we discussed at his appointment.  Tried pt at both numbers listed. No answer.  Please contact pt on Monday morning to relay above info and see how he is feeling.

## 2012-10-23 NOTE — Telephone Encounter (Signed)
Spoke with pt. States he feels a little better but states coughing continues, has irritated throat and increased mucus production in his throat. Does not know what to do for the mucus?  Had bad night last night. May contact pt on his cell# [phone].

## 2012-10-24 NOTE — Telephone Encounter (Signed)
I recommend mucinex 600mg  bid to thin the mucous secretions. He should follow up with Dr. Posey Rea in 1 week.

## 2012-10-24 NOTE — Telephone Encounter (Signed)
Pt informed of below. He states he will call us back to schedule f/u with PCP.

## 2012-11-01 ENCOUNTER — Telehealth: Payer: Self-pay | Admitting: Internal Medicine

## 2012-11-01 NOTE — Telephone Encounter (Signed)
Patient Information:  Caller Name: [name]  Phone: [phone]  Patient: [name] [name], [name] [name]  Gender: Male  DOB: 05-06-1941  Age: 72 Years  PCP: [name], Alex (Adults only)  Office Follow Up:  Does the office need to follow up with this patient?: Yes  Instructions For The Office: He wants to see Dr. Posey Rea only.   Symptoms  Reason For Call & Symptoms: Has a cold x 3 weeks but has worsened with increased congestion.  Requesting today appt.  Has hoarseness.  States that his b/p meds were changed 2 weeks ago and is afraid that that is the cause of same.  Reviewed Health History In EMR: No  Reviewed Medications In EMR: No  Reviewed Allergies In EMR: No  Reviewed Surgeries / Procedures: No  Date of Onset of Symptoms: 10/31/2012  Guideline(s) Used:  Cough  Disposition Per Guideline:   See Today or Tomorrow in Office  Reason For Disposition Reached:   Continuous (nonstop) coughing interferes with work or school and no improvement using cough treatment per Care Advice  Advice Given:  N/A

## 2012-11-02 ENCOUNTER — Ambulatory Visit (INDEPENDENT_AMBULATORY_CARE_PROVIDER_SITE_OTHER): Payer: BC Managed Care – PPO | Admitting: Internal Medicine

## 2012-11-02 ENCOUNTER — Other Ambulatory Visit: Payer: BC Managed Care – PPO

## 2012-11-02 ENCOUNTER — Encounter: Payer: Self-pay | Admitting: Internal Medicine

## 2012-11-02 VITALS — BP 150/96 | HR 80 | Temp 97.2°F | Resp 16 | Wt 217.0 lb

## 2012-11-02 DIAGNOSIS — R05 Cough: Secondary | ICD-10-CM

## 2012-11-02 DIAGNOSIS — R0609 Other forms of dyspnea: Secondary | ICD-10-CM

## 2012-11-02 DIAGNOSIS — R0600 Dyspnea, unspecified: Secondary | ICD-10-CM

## 2012-11-02 DIAGNOSIS — R059 Cough, unspecified: Secondary | ICD-10-CM

## 2012-11-02 MED ORDER — FLUTICASONE FUROATE-VILANTEROL 100-25 MCG/INH IN AEPB: 1.0000 | INHALATION_SPRAY | Freq: Every day | RESPIRATORY_TRACT | Status: DC

## 2012-11-02 MED ORDER — HYDROCOD POLST-CPM POLST ER 10-8 MG PO CP12: 1.0000 | ORAL_CAPSULE | Freq: Two times a day (BID) | ORAL | Status: DC | PRN

## 2012-11-02 MED ORDER — METHYLPREDNISOLONE ACETATE 80 MG/ML IJ SUSP
120.0000 mg | Freq: Once | INTRAMUSCULAR | Status: AC
  Administered 2012-11-02: 120 mg via INTRAMUSCULAR

## 2012-11-02 NOTE — Assessment & Plan Note (Signed)
Persistent due to cough

## 2012-11-02 NOTE — Assessment & Plan Note (Signed)
2/14 r/o pertussis - some better Labs Tussionex prn Breo

## 2012-11-02 NOTE — Progress Notes (Signed)
Patient ID: [name] [name], male   DOB: 07/19/1941, 72 y.o.   MRN: [id number]   Subjective:    Cough This is a new problem. The current episode started in the past 7 days. The problem has been waxing and waning. The problem occurs constantly. The cough is productive of sputum. Pertinent negatives include no chest pain, chills, rash or sore throat. The symptoms are aggravated by cold air. The treatment provided no relief. There is no history of asthma, COPD or pneumonia.    . C/o SOB, cough F/u HTN and palpitations.   BP Readings from Last 3 Encounters:  11/02/12 150/96  10/21/12 150/90  10/10/12 150/60   Wt Readings from Last 3 Encounters:  11/02/12 217 lb (98.431 kg)  10/21/12 217 lb (98.431 kg)  10/10/12 211 lb (95.709 kg)      Review of Systems  Constitutional: Negative for chills, appetite change, fatigue and unexpected weight change.  HENT: Negative for nosebleeds, congestion, sore throat, sneezing, trouble swallowing, neck pain and sinus pressure.   Eyes: Negative for itching and visual disturbance.  Respiratory: Positive for cough.   Cardiovascular: Negative for chest pain, palpitations and leg swelling.  Gastrointestinal: Negative for nausea, diarrhea, constipation, blood in stool and abdominal distention.  Genitourinary: Negative for dysuria, urgency, frequency, hematuria, decreased urine volume, scrotal swelling, enuresis and testicular pain.  Musculoskeletal: Negative for back pain, joint swelling and gait problem.  Skin: Negative for rash.  Neurological: Negative for dizziness, tremors, facial asymmetry, speech difficulty, weakness and light-headedness.  Psychiatric/Behavioral: Negative for sleep disturbance, self-injury, dysphoric mood and agitation. The patient is not nervous/anxious.        Objective:   Physical Exam  Constitutional: He is oriented to person, place, and time. He appears well-developed and well-nourished.  HENT:  Mouth/Throat: Oropharynx is  clear and moist.  Eyes: Conjunctivae are normal. Pupils are equal, round, and reactive to light.  Neck: Normal range of motion. No JVD present. No thyromegaly present.  Cardiovascular: Normal rate, regular rhythm, normal heart sounds and intact distal pulses.  Exam reveals no gallop and no friction rub.   No murmur heard. Pulmonary/Chest: Effort normal and breath sounds normal. No respiratory distress. He has no wheezes. He has no rales. He exhibits no tenderness.  Abdominal: Soft. Bowel sounds are normal. He exhibits no distension and no mass. There is no tenderness. There is no rebound and no guarding.  Genitourinary: Rectum normal and prostate normal. Guaiac negative stool. No penile tenderness.  Musculoskeletal: Normal range of motion. He exhibits no edema and no tenderness.  Lymphadenopathy:    He has no cervical adenopathy.  Neurological: He is alert and oriented to person, place, and time. He has normal reflexes. No cranial nerve deficit. He exhibits normal muscle tone. Coordination normal.  Skin: Skin is warm and dry. No rash noted.  Psychiatric: He has a normal mood and affect. His behavior is normal. Judgment and thought content normal.  Coughing a lot... Lab Results  Component Value Date   WBC 5.6 09/26/2012   HGB 14.8 09/26/2012   HCT 43.8 09/26/2012   PLT 158.0 09/26/2012   GLUCOSE 102* 09/26/2012   CHOL 133 09/26/2012   TRIG 100.0 09/26/2012   HDL 37.00* 09/26/2012   LDLDIRECT 151.5 11/20/2007   LDLCALC 76 09/26/2012   ALT 28 09/26/2012   AST 27 09/26/2012   NA 140 09/26/2012   K 3.8 09/26/2012   CL 103 09/26/2012   CREATININE 1.0 09/26/2012   BUN 22 09/26/2012   CO2  30 09/26/2012   TSH 3.77 09/26/2012   PSA 1.94 09/26/2012   INR 1.04 04/10/2011   HGBA1C 5.9 08/10/2011     I personally provided the [facility] inhaler use teaching. After the teaching patient was able to demonstrate it's use effectively. All questions were answered      Assessment & Plan:

## 2012-11-04 LAB — B PERTUSSIS IGG/IGM AB
B pertussis IgG Ab, Quant: 43 U/mL — ABNORMAL HIGH (ref 0–9)
B pertussis IgM Ab, Quant: <1 index (ref 0.0–0.9)

## 2012-11-07 ENCOUNTER — Telehealth: Payer: Self-pay | Admitting: Internal Medicine

## 2012-11-10 ENCOUNTER — Telehealth: Payer: Self-pay | Admitting: Internal Medicine

## 2012-11-10 NOTE — Telephone Encounter (Signed)
Left mess for patient to call back.  

## 2012-11-10 NOTE — Telephone Encounter (Signed)
Patient is requesting a call back with his lab results

## 2012-11-13 ENCOUNTER — Other Ambulatory Visit: Payer: Self-pay | Admitting: *Deleted

## 2012-11-13 NOTE — Telephone Encounter (Signed)
Pt wants to know if he needs any meds re: his recent labs. Also, his BP is still elevated. He thinks Diovan is making his BP worse. This is all he has been taking for bp. Please advise.

## 2012-11-13 NOTE — Telephone Encounter (Signed)
Labs are ok - no need to do anything Is the cough better? OK to stop Diovan Start Amlodipine 5 mg/d Thx

## 2012-11-14 MED ORDER — AMLODIPINE BESYLATE 5 MG PO TABS: 5.0000 mg | ORAL_TABLET | Freq: Every day | ORAL | Status: DC

## 2012-11-14 NOTE — Telephone Encounter (Signed)
Left mess for patient to call back.  

## 2012-12-04 ENCOUNTER — Ambulatory Visit (INDEPENDENT_AMBULATORY_CARE_PROVIDER_SITE_OTHER): Payer: BC Managed Care – PPO | Admitting: Internal Medicine

## 2012-12-04 ENCOUNTER — Encounter: Payer: Self-pay | Admitting: Internal Medicine

## 2012-12-04 VITALS — BP 180/90 | HR 80 | Temp 98.0°F | Resp 16 | Wt 209.0 lb

## 2012-12-04 DIAGNOSIS — R059 Cough, unspecified: Secondary | ICD-10-CM

## 2012-12-04 DIAGNOSIS — R0989 Other specified symptoms and signs involving the circulatory and respiratory systems: Secondary | ICD-10-CM

## 2012-12-04 DIAGNOSIS — I10 Essential (primary) hypertension: Secondary | ICD-10-CM

## 2012-12-04 DIAGNOSIS — R0600 Dyspnea, unspecified: Secondary | ICD-10-CM

## 2012-12-04 DIAGNOSIS — I4891 Unspecified atrial fibrillation: Secondary | ICD-10-CM

## 2012-12-04 DIAGNOSIS — R0609 Other forms of dyspnea: Secondary | ICD-10-CM

## 2012-12-04 DIAGNOSIS — R05 Cough: Secondary | ICD-10-CM

## 2012-12-04 MED ORDER — RANITIDINE HCL 300 MG PO CAPS: 300.0000 mg | ORAL_CAPSULE | Freq: Every evening | ORAL | Status: DC

## 2012-12-04 MED ORDER — GUAIFENESIN ER 600 MG PO TB12: 1200.0000 mg | ORAL_TABLET | Freq: Two times a day (BID) | ORAL | Status: DC

## 2012-12-04 MED ORDER — FLUCONAZOLE 100 MG PO TABS: ORAL_TABLET | ORAL | Status: DC

## 2012-12-04 MED ORDER — OLMESARTAN-AMLODIPINE-HCTZ 40-5-25 MG PO TABS: 1.0000 | ORAL_TABLET | Freq: Every day | ORAL | Status: DC

## 2012-12-04 NOTE — Progress Notes (Signed)
Subjective:    Cough This is a recurrent problem. The current episode started in the past 7 days. The problem has been resolved. The cough is productive of sputum. Associated symptoms include heartburn. Pertinent negatives include no chest pain, chills, rash or sore throat. There is no history of asthma, COPD or pneumonia.    F/u SOB, cough - resolved F/u HTN - not better. C/o hoarsness, mucus, GERD   BP Readings from Last 3 Encounters:  12/04/12 180/90  11/02/12 150/96  10/21/12 150/90   Wt Readings from Last 3 Encounters:  12/04/12 209 lb (94.802 kg)  11/02/12 217 lb (98.431 kg)  10/21/12 217 lb (98.431 kg)      Review of Systems  Constitutional: Negative for chills, appetite change, fatigue and unexpected weight change.  HENT: Negative for nosebleeds, congestion, sore throat, sneezing, trouble swallowing, neck pain and sinus pressure.   Eyes: Negative for itching and visual disturbance.  Respiratory: Positive for cough.   Cardiovascular: Negative for chest pain, palpitations and leg swelling.  Gastrointestinal: Positive for heartburn. Negative for nausea, diarrhea, constipation, blood in stool and abdominal distention.  Genitourinary: Negative for dysuria, urgency, frequency, hematuria, decreased urine volume, scrotal swelling, enuresis and testicular pain.  Musculoskeletal: Negative for back pain, joint swelling and gait problem.  Skin: Negative for rash.  Neurological: Negative for dizziness, tremors, facial asymmetry, speech difficulty, weakness and light-headedness.  Psychiatric/Behavioral: Negative for sleep disturbance, self-injury, dysphoric mood and agitation. The patient is not nervous/anxious.        Objective:   Physical Exam  Constitutional: He is oriented to person, place, and time. He appears well-developed and well-nourished.  HENT:  Mouth/Throat: Oropharynx is clear and moist.  thrush  Eyes: Conjunctivae are normal. Pupils are equal, round, and  reactive to light.  Neck: Normal range of motion. No JVD present. No thyromegaly present.  Cardiovascular: Normal rate, regular rhythm and intact distal pulses.  Exam reveals no gallop and no friction rub.   Murmur (2/6) heard. Pulmonary/Chest: Effort normal and breath sounds normal. No respiratory distress. He has no wheezes. He has no rales. He exhibits no tenderness.  Abdominal: Soft. Bowel sounds are normal. He exhibits no distension and no mass. There is no tenderness. There is no rebound and no guarding.  Genitourinary: Rectum normal and prostate normal. Guaiac negative stool. No penile tenderness.  Musculoskeletal: Normal range of motion. He exhibits no edema and no tenderness.  Lymphadenopathy:    He has no cervical adenopathy.  Neurological: He is alert and oriented to person, place, and time. He has normal reflexes. No cranial nerve deficit. He exhibits normal muscle tone. Coordination normal.  Skin: Skin is warm and dry. No rash noted.  Psychiatric: He has a normal mood and affect. His behavior is normal. Judgment and thought content normal.  Coughing a lot... Lab Results  Component Value Date   WBC 5.6 09/26/2012   HGB 14.8 09/26/2012   HCT 43.8 09/26/2012   PLT 158.0 09/26/2012   GLUCOSE 102* 09/26/2012   CHOL 133 09/26/2012   TRIG 100.0 09/26/2012   HDL 37.00* 09/26/2012   LDLDIRECT 151.5 11/20/2007   LDLCALC 76 09/26/2012   ALT 28 09/26/2012   AST 27 09/26/2012   NA 140 09/26/2012   K 3.8 09/26/2012   CL 103 09/26/2012   CREATININE 1.0 09/26/2012   BUN 22 09/26/2012   CO2 30 09/26/2012   TSH 3.77 09/26/2012   PSA 1.94 09/26/2012   INR 1.04 04/10/2011   HGBA1C 5.9 08/10/2011  Assessment & Plan:

## 2012-12-07 NOTE — Assessment & Plan Note (Signed)
Continue with current prescription therapy as reflected on the Med list.  

## 2012-12-07 NOTE — Assessment & Plan Note (Signed)
Better Continue with current prescription therapy as reflected on the Med list.  

## 2013-01-02 ENCOUNTER — Encounter: Payer: Self-pay | Admitting: Internal Medicine

## 2013-01-02 ENCOUNTER — Ambulatory Visit (INDEPENDENT_AMBULATORY_CARE_PROVIDER_SITE_OTHER): Payer: BC Managed Care – PPO | Admitting: Internal Medicine

## 2013-01-02 VITALS — BP 158/82 | HR 72 | Temp 97.5°F | Resp 16 | Wt 208.0 lb

## 2013-01-02 DIAGNOSIS — E785 Hyperlipidemia, unspecified: Secondary | ICD-10-CM

## 2013-01-02 DIAGNOSIS — M545 Low back pain, unspecified: Secondary | ICD-10-CM

## 2013-01-02 DIAGNOSIS — I10 Essential (primary) hypertension: Secondary | ICD-10-CM

## 2013-01-02 DIAGNOSIS — T50905A Adverse effect of unspecified drugs, medicaments and biological substances, initial encounter: Secondary | ICD-10-CM

## 2013-01-02 DIAGNOSIS — T887XXA Unspecified adverse effect of drug or medicament, initial encounter: Secondary | ICD-10-CM

## 2013-01-02 MED ORDER — CLONAZEPAM 0.25 MG PO TBDP: 0.2500 mg | ORAL_TABLET | Freq: Two times a day (BID) | ORAL | Status: DC | PRN

## 2013-01-02 MED ORDER — LORATADINE 10 MG PO TABS: 10.0000 mg | ORAL_TABLET | Freq: Every day | ORAL | Status: DC

## 2013-01-02 MED ORDER — AMLODIPINE BESYLATE 5 MG PO TABS: 5.0000 mg | ORAL_TABLET | Freq: Every day | ORAL | Status: DC

## 2013-01-02 NOTE — Assessment & Plan Note (Signed)
Likely aggravated by Zocor Hold Zocor  UA

## 2013-01-02 NOTE — Assessment & Plan Note (Signed)
See instructions and med changes

## 2013-01-02 NOTE — Assessment & Plan Note (Signed)
Hold Tribenzor Use Norvasc 5 mg/d

## 2013-01-02 NOTE — Assessment & Plan Note (Signed)
Hold Zocor due to pains

## 2013-01-02 NOTE — Patient Instructions (Signed)
Hold Simvastatin If not better - stop Tribenzor and start Amlodipine 5 mg a day Continue Ranitidine Take Claritin 10 mg a day Take Clonazepam for anxiety

## 2013-01-02 NOTE — Progress Notes (Signed)
   Subjective:    HPI  C/o SOB, throat is closing up, worse at night. Throat discomfort is being helped by using 3 pillows. Throat as checked by Dr Ezzard Standing F/u HTN - not better. C/o muscle aches and pains now in the back C/o hoarsness, mucus, GERD   BP Readings from Last 3 Encounters:  01/02/13 158/82  12/04/12 180/90  11/02/12 150/96   Wt Readings from Last 3 Encounters:  01/02/13 208 lb (94.348 kg)  12/04/12 209 lb (94.802 kg)  11/02/12 217 lb (98.431 kg)      Review of Systems  Constitutional: Negative for appetite change, fatigue and unexpected weight change.  HENT: Negative for nosebleeds, congestion, sneezing, trouble swallowing, neck pain and sinus pressure.   Eyes: Negative for itching and visual disturbance.  Cardiovascular: Negative for palpitations and leg swelling.  Gastrointestinal: Negative for nausea, diarrhea, constipation, blood in stool and abdominal distention.  Genitourinary: Negative for dysuria, urgency, frequency, hematuria, decreased urine volume, scrotal swelling, enuresis and testicular pain.  Musculoskeletal: Negative for back pain, joint swelling and gait problem.  Neurological: Negative for dizziness, tremors, facial asymmetry, speech difficulty, weakness and light-headedness.  Psychiatric/Behavioral: Negative for sleep disturbance, self-injury, dysphoric mood and agitation. The patient is not nervous/anxious.        Objective:   Physical Exam  Constitutional: He is oriented to person, place, and time. He appears well-developed and well-nourished.  HENT:  Mouth/Throat: Oropharynx is clear and moist.  thrush  Eyes: Conjunctivae are normal. Pupils are equal, round, and reactive to light.  Neck: Normal range of motion. No JVD present. No thyromegaly present.  Cardiovascular: Normal rate, regular rhythm and intact distal pulses.  Exam reveals no gallop and no friction rub.   Murmur (2/6) heard. Pulmonary/Chest: Effort normal and breath sounds  normal. No respiratory distress. He has no wheezes. He has no rales. He exhibits no tenderness.  Abdominal: Soft. Bowel sounds are normal. He exhibits no distension and no mass. There is no tenderness. There is no rebound and no guarding.  Genitourinary: Rectum normal and prostate normal. Guaiac negative stool. No penile tenderness.  Musculoskeletal: Normal range of motion. He exhibits no edema and no tenderness.  Lymphadenopathy:    He has no cervical adenopathy.  Neurological: He is alert and oriented to person, place, and time. He has normal reflexes. No cranial nerve deficit. He exhibits normal muscle tone. Coordination normal.  Skin: Skin is warm and dry. No rash noted.  Psychiatric: He has a normal mood and affect. His behavior is normal. Judgment and thought content normal.   Lab Results  Component Value Date   WBC 5.6 09/26/2012   HGB 14.8 09/26/2012   HCT 43.8 09/26/2012   PLT 158.0 09/26/2012   GLUCOSE 102* 09/26/2012   CHOL 133 09/26/2012   TRIG 100.0 09/26/2012   HDL 37.00* 09/26/2012   LDLDIRECT 151.5 11/20/2007   LDLCALC 76 09/26/2012   ALT 28 09/26/2012   AST 27 09/26/2012   NA 140 09/26/2012   K 3.8 09/26/2012   CL 103 09/26/2012   CREATININE 1.0 09/26/2012   BUN 22 09/26/2012   CO2 30 09/26/2012   TSH 3.77 09/26/2012   PSA 1.94 09/26/2012   INR 1.04 04/10/2011   HGBA1C 5.9 08/10/2011           Assessment & Plan:

## 2013-01-11 ENCOUNTER — Other Ambulatory Visit (INDEPENDENT_AMBULATORY_CARE_PROVIDER_SITE_OTHER): Payer: BC Managed Care – PPO

## 2013-01-11 ENCOUNTER — Other Ambulatory Visit: Payer: Self-pay | Admitting: *Deleted

## 2013-01-11 DIAGNOSIS — R300 Dysuria: Secondary | ICD-10-CM

## 2013-01-11 LAB — URINALYSIS, ROUTINE W REFLEX MICROSCOPIC
Bilirubin Urine: NEGATIVE
Hgb urine dipstick: NEGATIVE
Ketones, ur: NEGATIVE
Leukocytes, UA: NEGATIVE
Nitrite: NEGATIVE
Specific Gravity, Urine: 1.015 (ref 1.000–1.030)
Total Protein, Urine: NEGATIVE
Urine Glucose: NEGATIVE
Urobilinogen, UA: 0.2 (ref 0.0–1.0)
pH: 6.5 (ref 5.0–8.0)

## 2013-01-23 ENCOUNTER — Emergency Department (HOSPITAL_COMMUNITY): Payer: Medicare Other

## 2013-01-23 ENCOUNTER — Telehealth: Payer: Self-pay

## 2013-01-23 ENCOUNTER — Encounter (HOSPITAL_COMMUNITY): Payer: Self-pay | Admitting: Emergency Medicine

## 2013-01-23 ENCOUNTER — Inpatient Hospital Stay (HOSPITAL_COMMUNITY)
Admission: EM | Admit: 2013-01-23 | Discharge: 2013-01-26 | DRG: 310 | Disposition: A | Discharge status: Home / Self Care | Payer: Medicare Other | Attending: Cardiology | Admitting: Cardiology

## 2013-01-23 ENCOUNTER — Telehealth: Payer: Self-pay | Admitting: Cardiology

## 2013-01-23 DIAGNOSIS — Z800 Family history of malignant neoplasm of digestive organs: Secondary | ICD-10-CM

## 2013-01-23 DIAGNOSIS — I4891 Unspecified atrial fibrillation: Secondary | ICD-10-CM

## 2013-01-23 DIAGNOSIS — Z79899 Other long term (current) drug therapy: Secondary | ICD-10-CM

## 2013-01-23 DIAGNOSIS — E785 Hyperlipidemia, unspecified: Secondary | ICD-10-CM | POA: Diagnosis present

## 2013-01-23 DIAGNOSIS — Z7901 Long term (current) use of anticoagulants: Secondary | ICD-10-CM

## 2013-01-23 DIAGNOSIS — Z8249 Family history of ischemic heart disease and other diseases of the circulatory system: Secondary | ICD-10-CM | POA: Diagnosis not present

## 2013-01-23 DIAGNOSIS — I509 Heart failure, unspecified: Secondary | ICD-10-CM | POA: Diagnosis not present

## 2013-01-23 DIAGNOSIS — M1990 Unspecified osteoarthritis, unspecified site: Secondary | ICD-10-CM | POA: Diagnosis present

## 2013-01-23 DIAGNOSIS — E7800 Pure hypercholesterolemia, unspecified: Secondary | ICD-10-CM | POA: Diagnosis not present

## 2013-01-23 DIAGNOSIS — I359 Nonrheumatic aortic valve disorder, unspecified: Secondary | ICD-10-CM | POA: Diagnosis present

## 2013-01-23 DIAGNOSIS — I10 Essential (primary) hypertension: Secondary | ICD-10-CM

## 2013-01-23 DIAGNOSIS — J9819 Other pulmonary collapse: Secondary | ICD-10-CM | POA: Diagnosis not present

## 2013-01-23 LAB — TROPONIN I: Troponin I: <0.3 ng/mL (ref <0.30)

## 2013-01-23 LAB — CBC
HCT: 46.1 % (ref 39.0–52.0)
Hemoglobin: 16.2 g/dL (ref 13.0–17.0)
MCH: 31.6 pg (ref 26.0–34.0)
MCHC: 35.1 g/dL (ref 30.0–36.0)
MCV: 89.9 fL (ref 78.0–100.0)
Platelets: 193 10*3/uL (ref 150–400)
RBC: 5.13 MIL/uL (ref 4.22–5.81)
RDW: 13.7 % (ref 11.5–15.5)
WBC: 9.4 10*3/uL (ref 4.0–10.5)

## 2013-01-23 LAB — BASIC METABOLIC PANEL
BUN: 16 mg/dL (ref 6–23)
CO2: 24 mEq/L (ref 19–32)
Calcium: 9.3 mg/dL (ref 8.4–10.5)
Chloride: 106 mEq/L (ref 96–112)
Creatinine, Ser: 1.07 mg/dL (ref 0.50–1.35)
GFR calc Af Amer: 78 mL/min — ABNORMAL LOW (ref >90)
GFR calc non Af Amer: 67 mL/min — ABNORMAL LOW (ref >90)
Glucose, Bld: 97 mg/dL (ref 70–99)
Potassium: 3.8 mEq/L (ref 3.5–5.1)
Sodium: 143 mEq/L (ref 135–145)

## 2013-01-23 LAB — PROTIME-INR
INR: 0.97 (ref 0.00–1.49)
Prothrombin Time: 12.8 seconds (ref 11.6–15.2)

## 2013-01-23 MED ORDER — HEPARIN BOLUS VIA INFUSION
4500.0000 [IU] | Freq: Once | INTRAVENOUS | Status: AC
  Administered 2013-01-24: 4500 [IU] via INTRAVENOUS

## 2013-01-23 MED ORDER — DILTIAZEM HCL 25 MG/5ML IV SOLN
20.0000 mg | Freq: Once | INTRAVENOUS | Status: AC
  Administered 2013-01-23: 20 mg via INTRAVENOUS
  Filled 2013-01-23: qty 5

## 2013-01-23 MED ORDER — HEPARIN (PORCINE) IN NACL 100-0.45 UNIT/ML-% IJ SOLN
1400.0000 [IU]/h | INTRAMUSCULAR | Status: DC
  Administered 2013-01-24: 1400 [IU]/h via INTRAVENOUS
  Filled 2013-01-23 (×2): qty 250

## 2013-01-23 MED ORDER — DILTIAZEM HCL 100 MG IV SOLR
5.0000 mg/h | INTRAVENOUS | Status: DC
  Administered 2013-01-23: 5 mg/h via INTRAVENOUS
  Administered 2013-01-24: 10 mg/h via INTRAVENOUS
  Administered 2013-01-24: 5 mg/h via INTRAVENOUS
  Filled 2013-01-23 (×4): qty 100

## 2013-01-23 MED ORDER — VERAPAMIL HCL ER 240 MG PO TBCR: 240.0000 mg | EXTENDED_RELEASE_TABLET | Freq: Every day | ORAL | Status: DC

## 2013-01-23 NOTE — Telephone Encounter (Signed)
Use a cough syrup Stop Amlodipine Start Verapamil instead - it should help better with a HR and BP OV in 2 wks or sooner if issues Thx

## 2013-01-23 NOTE — Progress Notes (Signed)
ANTICOAGULATION CONSULT NOTE - Initial Consult  Pharmacy Consult for heparin  Indication: atrial fibrillation  Allergies  Allergen Reactions  . Benazepril     cough    Patient Measurements: Height: 5\' 11"  (180.3 cm) Weight: 207 lb 14.3 oz (94.3 kg) (Per 01/02/13 documentation) IBW/kg (Calculated) : 75.3 Heparin Dosing Weight: 94 kg  Vital Signs: Temp: 97.9 F (36.6 C) (05/13 2225) Temp src: Oral (05/13 1834) BP: 113/77 mmHg (05/13 2225) Pulse Rate: 101 (05/13 2225)  Labs:  Recent Labs  01/23/13 1837 01/23/13 1907  HGB 16.2  --   HCT 46.1  --   PLT 193  --   LABPROT 12.8  --   INR 0.97  --   CREATININE 1.07  --   TROPONINI  --  <0.30    Estimated Creatinine Clearance: 73.2 ml/min (by C-G formula based on Cr of 1.07).   Medical History: Past Medical History  Diagnosis Date  . HTN (hypertension)   . A-fib     The Pt had new onset Afib in 07/2008. He underwent ibutilide cardioversion successfully. He has had 1-2 episodes/year that are short-lived that likely are  Afib w/RVR. CHADSVASC score 2.  . Hypercholesteremia   . Aortic stenosis, mild     Echo 07/11: 55-60%, mild LV hypertrophy, mild aortic stenosis w/mean gradient and peak gradient 36 mmHg.  . Osteoarthritis   . LBP (low back pain)   . Chest pain     ETT-myoview 12/11 w/exercise, no chest pain, no significant ST changes, EF 69%, no evidence for ischemia or infarction.    Medications:  Scheduled:    Assessment: 72 yo male with atrial fibrillation with RVR. Pharmacy to manage IV heparin.   Goal of Therapy:  Heparin level 0.3-0.7 units/ml Monitor platelets by anticoagulation protocol: Yes   Plan:  1. Heparin 4500 unit IV bolus x 1, then IV infusion at 1400 units/hr.  2. Heparin level in 8 hours. 3. Daily CBC, heparin level.  Emeline Gins 01/23/2013,11:16 PM

## 2013-01-23 NOTE — Telephone Encounter (Signed)
Pt called because he said lately he has been concern about being tired and some heart/chest discomfort, and BP issues. Pt was seen in this office by Dr. Shirlee Latch on 09/17/10 . Patient's BP has been F/U  by Dr. Posey Rea. Patient states has been very disappointed with his PCP. Pt would like to be seen soon. Pt is aware that Dr. Shirlee Latch nor the PA have openings until later after June, Pt states he is planning to go to [place] after June, so he refused to have an appointments made after June 2014. Pt has an appointment with his PCP 01/26/13 and will keep that appointment. Pt said he will go to the hospital ER if symptoms get worse.

## 2013-01-23 NOTE — Telephone Encounter (Signed)
Phone call from pt. Had a hard time understanding pt and he was getting frustrated repeating himself. He states his blood pressure is elevated this morning, diastolic reading is in the 100's and pulse is 120. I asked him if he is having any other symptoms and he states besides coughing (which he thinks are his allergies) no other symptoms. Please advise.

## 2013-01-23 NOTE — ED Notes (Signed)
Xray doing portable chest xray

## 2013-01-23 NOTE — ED Notes (Signed)
No pain just does not feel well

## 2013-01-23 NOTE — ED Notes (Signed)
Pt here with tachycardia and CP; pt sts hx of same; pt noted to tachycardic; pt sts SOB

## 2013-01-23 NOTE — Telephone Encounter (Signed)
New problem   Pt is experiencing problems with blood pressure please call pt he is very concern about this and heartbeat problems.

## 2013-01-23 NOTE — ED Notes (Signed)
Pt  Alert no distress.  Skin warm and dry.  Family at the bedside.  Pulse 105 irregular af

## 2013-01-23 NOTE — ED Notes (Signed)
The pt is still in af no sign change in his rhy.  Admitting doctor at the bedside

## 2013-01-23 NOTE — Telephone Encounter (Signed)
Called pt back and let him know to stop taking Amlodipine and to start taking Verapamil and f/u in 2 weeks. He states he already has an appt. I let him know to also use a cough syrup. He did not have further concerns or questions.

## 2013-01-23 NOTE — ED Notes (Signed)
cardizem drip started at 5mg /ml

## 2013-01-23 NOTE — ED Provider Notes (Signed)
History     CSN: [id number]  Arrival date & time 01/23/13  1825   First MD Initiated Contact with Patient 01/23/13 1846      Chief Complaint  Patient presents with  . Tachycardia  . Chest Pain    (Consider location/radiation/quality/duration/timing/severity/associated sxs/prior treatment) HPI Comments: Patient presents with rapid heart weight and feeling of discomfort in his throat area. He denies any other chest pain. He states it started this morning and is been going on throughout the day. He does complain of some increased shortness of breath throughout today. He has a history of atrial fibrillation and [facility]. He is on verapamil. He denies any increased leg swelling. He denies any cough or chest congestion.  Patient is a 72 y.o. male presenting with chest pain.  Chest Pain Associated symptoms: palpitations and shortness of breath   Associated symptoms: no abdominal pain, no back pain, no cough, no diaphoresis, no dizziness, no fatigue, no fever, no headache, no nausea, no numbness, not vomiting and no weakness     Past Medical History  Diagnosis Date  . HTN (hypertension)   . A-fib     The Pt had new onset Afib in 07/2008. He underwent ibutilide cardioversion successfully. He has had 1-2 episodes/year that are short-lived that likely are  Afib w/RVR. CHADSVASC score 2.  . Hypercholesteremia   . Aortic stenosis, mild     Echo 07/11: 55-60%, mild LV hypertrophy, mild aortic stenosis w/mean gradient and peak gradient 36 mmHg.  . Osteoarthritis   . LBP (low back pain)   . Chest pain     ETT-myoview 12/11 w/exercise, no chest pain, no significant ST changes, EF 69%, no evidence for ischemia or infarction.    Past Surgical History  Procedure Laterality Date  . Back surgery  x12 years ago  . Lumbar laminectomy    . Hemorrhoid surgery      Family History  Problem Relation Age of Onset  . Hypertension Other   . Coronary artery  disease Neg Hx   . Cancer Mother 28    colon    History  Substance Use Topics  . Smoking status: Never Smoker   . Smokeless tobacco: Not on file  . Alcohol Use: 0.0 oz/week     Comment: Drinks 1 glass of wine nightly.      Review of Systems  Constitutional: Negative for fever, chills, diaphoresis and fatigue.  HENT: Negative for congestion, rhinorrhea and sneezing.   Eyes: Negative.   Respiratory: Positive for shortness of breath. Negative for cough and chest tightness.   Cardiovascular: Positive for chest pain and palpitations. Negative for leg swelling.  Gastrointestinal: Negative for nausea, vomiting, abdominal pain, diarrhea and blood in stool.  Genitourinary: Negative for frequency, hematuria, flank pain and difficulty urinating.  Musculoskeletal: Negative for back pain and arthralgias.  Skin: Negative for rash.  Neurological: Negative for dizziness, speech difficulty, weakness, numbness and headaches.    Allergies  Benazepril  Home Medications   Current Outpatient Rx  Name  Route  Sig  Dispense  Refill  . amoxicillin (AMOXIL) 875 MG tablet   Oral   Take 875 mg by mouth 2 (two) times daily.         [name] aspirin 325 MG tablet   Oral   Take 325 mg by mouth daily.           . Cholecalciferol 1000 UNITS capsule   Oral   Take 1,000 Units by mouth  daily.         . loratadine (CLARITIN) 10 MG tablet   Oral   Take 1 tablet (10 mg total) by mouth daily.   100 tablet   3   . verapamil (CALAN-SR) 240 MG CR tablet   Oral   Take 1 tablet (240 mg total) by mouth daily.   30 tablet   11   . clonazePAM (KLONOPIN) 0.25 MG disintegrating tablet   Oral   Take 1 tablet (0.25 mg total) by mouth 2 (two) times daily as needed. Anxiety   60 tablet   1   . metoprolol succinate (TOPROL-XL) 25 MG 24 hr tablet   Oral   Take 25 mg by mouth 2 (two) times daily.           BP 121/87  Pulse 94  Temp(Src) 98.2 F (36.8 C) (Oral)  Resp 18  SpO2 97%  Physical Exam   Constitutional: He is oriented to person, place, and time. He appears well-developed and well-nourished.  HENT:  Head: Normocephalic and atraumatic.  Eyes: Pupils are equal, round, and reactive to light.  Neck: Normal range of motion. Neck supple.  Cardiovascular: Regular rhythm and normal heart sounds.  Tachycardia present.   Pulmonary/Chest: Effort normal and breath sounds normal. No respiratory distress. He has no wheezes. He has no rales. He exhibits no tenderness.  Abdominal: Soft. Bowel sounds are normal. There is no tenderness. There is no rebound and no guarding.  Musculoskeletal: Normal range of motion. He exhibits no edema.  Lymphadenopathy:    He has no cervical adenopathy.  Neurological: He is alert and oriented to person, place, and time.  Skin: Skin is warm and dry. No rash noted.  Psychiatric: He has a normal mood and affect.    ED Course  Procedures (including critical care time)  Results for orders placed during the hospital encounter of 01/23/13  CBC      Result Value Range   WBC 9.4  4.0 - 10.5 K/uL   RBC 5.13  4.22 - 5.81 MIL/uL   Hemoglobin 16.2  13.0 - 17.0 g/dL   HCT 16.1  09.6 - 04.5 %   MCV 89.9  78.0 - 100.0 fL   MCH 31.6  26.0 - 34.0 pg   MCHC 35.1  30.0 - 36.0 g/dL   RDW 40.9  81.1 - 91.4 %   Platelets 193  150 - 400 K/uL  BASIC METABOLIC PANEL      Result Value Range   Sodium 143  135 - 145 mEq/L   Potassium 3.8  3.5 - 5.1 mEq/L   Chloride 106  96 - 112 mEq/L   CO2 24  19 - 32 mEq/L   Glucose, Bld 97  70 - 99 mg/dL   BUN 16  6 - 23 mg/dL   Creatinine, Ser 7.82  0.50 - 1.35 mg/dL   Calcium 9.3  8.4 - 95.6 mg/dL   GFR calc non Af Amer 67 (*) >90 mL/min   GFR calc Af Amer 78 (*) >90 mL/min  PROTIME-INR      Result Value Range   Prothrombin Time 12.8  11.6 - 15.2 seconds   INR 0.97  0.00 - 1.49   No results found.   Date: 01/23/2013  Rate: 164  Rhythm: atrial fibrillation  QRS Axis: normal  Intervals: normal  ST/T Wave abnormalities:  nonspecific ST/T changes  Conduction Disutrbances:none  Narrative Interpretation:   Old EKG Reviewed: none available    1. Atrial  fibrillation with rapid ventricular response       MDM  Patient is given a Cardizem bolus to a total of 20 mg. His heart rate slowed into the 110-120 range. He was started on a Cardizem drip at 5 mg per hour. He remained in atrial fibrillation. I will consult cardiology.  Dr Daleen Squibb to see   CRITICAL CARE Performed by: Lakesa Coste Total critical care time: 30 Critical care time was exclusive of separately billable procedures and treating other patients. Critical care was necessary to treat or prevent imminent or life-threatening deterioration. Critical care was time spent personally by me on the following activities: development of treatment plan with patient and/or surrogate as well as nursing, discussions with consultants, evaluation of patient's response to treatment, examination of patient, obtaining history from patient or surrogate, ordering and performing treatments and interventions, ordering and review of laboratory studies, ordering and review of radiographic studies, pulse oximetry and re-evaluation of patient's condition.      Rolan Bucco, MD 01/23/13 2024

## 2013-01-23 NOTE — H&P (Signed)
Roverto Loredo is an 72 y.o. male.   Chief Complaint: palpitations HPI: Mr. Delauder is a pleasant man with PMH of HTN, low back pain, mild aortic stenosis, atrial fibrillation dating to 2009 last episode 2 years ago, dyslipidemia and respiratory issues/cough off/on since January 2014 who has had palpitations and known sensations of atrial fibrillation since approximately 14:00 on Sunday, May 11th. He tells me he was walking 3x weekly for approximately 4 miles in 45 minutes (some jogging) until January and then he began his worst sensation of upper respiratory symptoms such as coughing/etc. He's adverse to medications but approximately 1 week ago Dr. Ezzard Standing prescribed him 10 mg prednisone and amoxicillin and he took for 3 days before stopping. He is not sure if he had much relief. He's been trying different hypertension medications, most recently diovan which he felt made his blood pressure worse. Since Sunday he's had palpitations but largely no other symptoms besides feeling a little off. He does not endorse much SOB. No chest pain, no syncope, no fever/chills/diarrhea. No sick contacts. No change in weight or appetite. We spent 30 minutes discussing the risks of atrial fibrillation and the general stepwise management of atrial fibrillation.   Past Medical History  Diagnosis Date  . HTN (hypertension)   . A-fib     The Pt had new onset Afib in 07/2008. He underwent ibutilide cardioversion successfully. He has had 1-2 episodes/year that are short-lived that likely are  Afib w/RVR. CHADSVASC score 2.  . Hypercholesteremia   . Aortic stenosis, mild     Echo 07/11: 55-60%, mild LV hypertrophy, mild aortic stenosis w/mean gradient and peak gradient 36 mmHg.  . Osteoarthritis   . LBP (low back pain)   . Chest pain     ETT-myoview 12/11 w/exercise, no chest pain, no significant ST changes, EF 69%, no evidence for ischemia or infarction.    Past Surgical History  Procedure Laterality Date  . Back  surgery  x12 years ago  . Lumbar laminectomy    . Hemorrhoid surgery      Family History  Problem Relation Age of Onset  . Hypertension Other   . Coronary artery disease Neg Hx   . Cancer Mother 27    colon   Social History:  reports that he has never smoked. He does not have any smokeless tobacco history on file. He reports that  drinks alcohol. He reports that he does not use illicit drugs.  Allergies:  Allergies  Allergen Reactions  . Benazepril     cough     (Not in a hospital admission) Home medications reviewed;  Current Facility-Administered Medications  Medication Dose Route Frequency Provider Last Rate Last Dose  . diltiazem (CARDIZEM) 100 mg in dextrose 5 % 100 mL infusion  5 mg/hr Intravenous Titrated Rolan Bucco, MD 5 mL/hr at 01/23/13 1948 5 mg/hr at 01/23/13 1948   Current Outpatient Prescriptions  Medication Sig Dispense Refill  . amoxicillin (AMOXIL) 875 MG tablet Take 875 mg by mouth 2 (two) times daily.      [name] aspirin 325 MG tablet Take 325 mg by mouth daily.        . Cholecalciferol 1000 UNITS capsule Take 1,000 Units by mouth daily.      [name] loratadine (CLARITIN) 10 MG tablet Take 1 tablet (10 mg total) by mouth daily.  100 tablet  3  . verapamil (CALAN-SR) 240 MG CR tablet Take 1 tablet (240 mg total) by mouth daily.  30 tablet  11  .  clonazePAM (KLONOPIN) 0.25 MG disintegrating tablet Take 1 tablet (0.25 mg total) by mouth 2 (two) times daily as needed. Anxiety  60 tablet  1  . metoprolol succinate (TOPROL-XL) 25 MG 24 hr tablet Take 25 mg by mouth 2 (two) times daily.        Results for orders placed during the hospital encounter of 01/23/13 (from the past 48 hour(s))  CBC     Status: None   Collection Time    01/23/13  6:37 PM      Result Value Range   WBC 9.4  4.0 - 10.5 K/uL   RBC 5.13  4.22 - 5.81 MIL/uL   Hemoglobin 16.2  13.0 - 17.0 g/dL   HCT 40.9  81.1 - 91.4 %   MCV 89.9  78.0 - 100.0 fL   MCH 31.6  26.0 - 34.0 pg   MCHC 35.1  30.0 -  36.0 g/dL   RDW 78.2  95.6 - 21.3 %   Platelets 193  150 - 400 K/uL  BASIC METABOLIC PANEL     Status: Abnormal   Collection Time    01/23/13  6:37 PM      Result Value Range   Sodium 143  135 - 145 mEq/L   Potassium 3.8  3.5 - 5.1 mEq/L   Chloride 106  96 - 112 mEq/L   CO2 24  19 - 32 mEq/L   Glucose, Bld 97  70 - 99 mg/dL   BUN 16  6 - 23 mg/dL   Creatinine, Ser 0.86  0.50 - 1.35 mg/dL   Calcium 9.3  8.4 - 57.8 mg/dL   GFR calc non Af Amer 67 (*) >90 mL/min   GFR calc Af Amer 78 (*) >90 mL/min   Comment:            The eGFR has been calculated     using the CKD EPI equation.     This calculation has not been     validated in all clinical     situations.     eGFR's persistently     <90 mL/min signify     possible Chronic Kidney Disease.  PROTIME-INR     Status: None   Collection Time    01/23/13  6:37 PM      Result Value Range   Prothrombin Time 12.8  11.6 - 15.2 seconds   INR 0.97  0.00 - 1.49  TROPONIN I     Status: None   Collection Time    01/23/13  7:07 PM      Result Value Range   Troponin I <0.30  <0.30 ng/mL   Comment:            Due to the release kinetics of cTnI,     a negative result within the first hours     of the onset of symptoms does not rule out     myocardial infarction with certainty.     If myocardial infarction is still suspected,     repeat the test at appropriate intervals.   Dg Chest Portable 1 View  01/23/2013  *RADIOLOGY REPORT*  Clinical Data: Tachycardia and chest pain  PORTABLE CHEST - 1 VIEW  Comparison: Chest radiograph 10/21/2012 and 04/10/2011  Findings: There is stable mild cardiomegaly.  Mild pulmonary vascular congestion is present.  Streaky linear atelectasis is seen at both lung bases.  No definite pulmonary edema.  No visible pleural effusion or pneumothorax.  Cardiac leads project over the chest.  No acute bony abnormalities identified.  IMPRESSION: Mild cardiomegaly pulmonary vascular congestion.  Streaky bibasilar  atelectasis.   Original Report Authenticated By: Britta Mccreedy, M.D.     Review of Systems  Constitutional: Negative for fever, chills, weight loss and malaise/fatigue.  HENT: Negative for hearing loss, neck pain and tinnitus.   Eyes: Negative for double vision, photophobia and pain.  Respiratory: Positive for cough. Negative for hemoptysis and sputum production.   Cardiovascular: Positive for palpitations. Negative for chest pain, orthopnea, claudication, leg swelling and PND.  Gastrointestinal: Negative for heartburn, nausea, vomiting and abdominal pain.  Musculoskeletal: Negative for myalgias.  Skin: Negative for itching and rash.  Neurological: Negative for dizziness, tingling, tremors and headaches.  Endo/Heme/Allergies: Negative for environmental allergies and polydipsia. Does not bruise/bleed easily.  Psychiatric/Behavioral: Negative for depression, suicidal ideas and substance abuse.    Blood pressure 123/82, pulse 103, temperature 98.2 F (36.8 C), temperature source Oral, resp. rate 20, SpO2 98.00%. Physical Exam  Nursing note and vitals reviewed. Constitutional: He is oriented to person, place, and time. He appears well-developed and well-nourished. No distress.  HENT:  Head: Normocephalic and atraumatic.  Nose: Nose normal.  Mouth/Throat: Oropharynx is clear and moist. No oropharyngeal exudate.  Eyes: Conjunctivae and EOM are normal. Pupils are equal, round, and reactive to light. No scleral icterus.  Neck: Normal range of motion. Neck supple. No JVD present. No tracheal deviation present. No thyromegaly present.  Cardiovascular: Intact distal pulses.   Murmur heard. Irregularly irregular, II/VI SEM at LSB  Respiratory: Effort normal and breath sounds normal. No respiratory distress. He has no wheezes. He has no rales.  GI: Soft. Bowel sounds are normal. He exhibits no distension. There is no tenderness. There is no rebound.  Musculoskeletal: Normal range of motion. He  exhibits no edema and no tenderness.  Neurological: He is alert and oriented to person, place, and time. No cranial nerve deficit. Coordination normal.  Skin: Skin is warm and dry. No rash noted. He is not diaphoretic. No erythema.  Psychiatric: He has a normal mood and affect. His behavior is normal. Thought content normal.  labs reviewed; wbc 9.4, h/h 16.2/46.1, plt 193, na 143, K 3.8, bun/cr 16/1.07, calcium 9.3, ptt/12.8/inr 0.97, troponin <0.3 7/11 Echo mild LVH, EF 55-60%, mild to moderate AS, dilated LA (mild), peak/mean AV gradient 36/19 mmHg Urinalysis from 01/11/13 unrevealing ECG with atrial fibrillation + RVR ~ 160s, inferolateral ST depressions - rate related Chest x-ray reviewed; no overt effusions or infiltrate  Problem List Acute on chronic atrial fibrillation (atrial fibrillation with RVR, last episode 2 years ago) Hypertension Dyslipidemia Mild Aortic Stenosis Upper respiratory cough/symptoms ( I think improved)  Assessment/Plan 72 yo man with known atrial fibrillation, hypertension, dyslipidemia, mild AS, upper respiratory symptoms previous here with atrial fibrillation with RVR. Differential diagnosis for atrial fibrillation - structural heart disease - worsening, heart failure, infection, thyroid disease among other etiologies. We spent 30 minutes discussing the important merits of atrial fibrillation including the short and long-term stroke risks and the management strategies of rate control, rhythm control and anticoagulation. He current is on a large aspirin and he has been adverse to taking other medications such as warfarin long-term. His CHA2DS2VASC score is 2 with an annual stroke risk of 2.2%. We developed a current management strategy of diltiazem gtt overnight with potential for (but unlikely) spontaneous conversion while starting heparin drip. He will be made NPO for potential TEE-DCCV and he has insurance so a novel agent such as apixaban (perhaps  preferred given  lower bleeding risk than aspirin) or xarelto for a minimum of 30 days and preferably indefinitely. I will add on low dose metoprolol and he can discharge home (if he converts) on beta-blocker or diltiazem. If he fails then an antiarrhythmic strategy with class IC (has mild LVH, so might need to evaluate) vs. tikosyn vs. Ablation strategy (we briefly discussed).  - NPO after MN for possible TEE-DCCV - pharmacy consult for heparin initiation for anticoagulation - continue diltiazem gtt; add on prior metoprolol (not currently taking and he also has not taken verapamil yet) - tsh, NTproBNP, urinalysis to evaluate triggers of atrial fibrillation - defer TEE-DCCV and/or TTE orders to Day team in the event he spontaneously converts    Smayan Hackbart 01/23/2013, 10:17 PM

## 2013-01-23 NOTE — ED Notes (Signed)
Dr Beatris Ship at bedside

## 2013-01-23 NOTE — ED Notes (Signed)
Dr Beatris Ship at bedside verifies to give second dose of 10 mg IV push

## 2013-01-24 ENCOUNTER — Encounter (HOSPITAL_COMMUNITY): Payer: Self-pay | Admitting: *Deleted

## 2013-01-24 DIAGNOSIS — I4891 Unspecified atrial fibrillation: Secondary | ICD-10-CM | POA: Diagnosis not present

## 2013-01-24 LAB — COMPREHENSIVE METABOLIC PANEL
ALT: 76 U/L — ABNORMAL HIGH (ref 0–53)
AST: 31 U/L (ref 0–37)
Albumin: 3.5 g/dL (ref 3.5–5.2)
Alkaline Phosphatase: 64 U/L (ref 39–117)
BUN: 12 mg/dL (ref 6–23)
CO2: 25 mEq/L (ref 19–32)
Calcium: 8.7 mg/dL (ref 8.4–10.5)
Chloride: 105 mEq/L (ref 96–112)
Creatinine, Ser: 0.84 mg/dL (ref 0.50–1.35)
GFR calc Af Amer: >90 mL/min (ref >90)
GFR calc non Af Amer: 85 mL/min — ABNORMAL LOW (ref >90)
Glucose, Bld: 113 mg/dL — ABNORMAL HIGH (ref 70–99)
Potassium: 3.3 mEq/L — ABNORMAL LOW (ref 3.5–5.1)
Sodium: 140 mEq/L (ref 135–145)
Total Bilirubin: 0.6 mg/dL (ref 0.3–1.2)
Total Protein: 6.5 g/dL (ref 6.0–8.3)

## 2013-01-24 LAB — CBC
HCT: 44.6 % (ref 39.0–52.0)
HCT: 46.8 % (ref 39.0–52.0)
Hemoglobin: 15 g/dL (ref 13.0–17.0)
Hemoglobin: 16.2 g/dL (ref 13.0–17.0)
MCH: 30.5 pg (ref 26.0–34.0)
MCH: 31.2 pg (ref 26.0–34.0)
MCHC: 33.6 g/dL (ref 30.0–36.0)
MCHC: 34.6 g/dL (ref 30.0–36.0)
MCV: 90.7 fL (ref 78.0–100.0)
MCV: 90 fL (ref 78.0–100.0)
Platelets: 183 10*3/uL (ref 150–400)
Platelets: 185 10*3/uL (ref 150–400)
RBC: 4.92 MIL/uL (ref 4.22–5.81)
RBC: 5.2 MIL/uL (ref 4.22–5.81)
RDW: 13.8 % (ref 11.5–15.5)
RDW: 13.8 % (ref 11.5–15.5)
WBC: 7.8 10*3/uL (ref 4.0–10.5)
WBC: 7.3 10*3/uL (ref 4.0–10.5)

## 2013-01-24 LAB — TROPONIN I
Troponin I: <0.3 ng/mL (ref <0.30)
Troponin I: <0.3 ng/mL (ref <0.30)

## 2013-01-24 LAB — CBC WITH DIFFERENTIAL/PLATELET
Basophils Absolute: 0 10*3/uL (ref 0.0–0.1)
Basophils Relative: 1 % (ref 0–1)
Eosinophils Absolute: 0.5 10*3/uL (ref 0.0–0.7)
Eosinophils Relative: 6 % — ABNORMAL HIGH (ref 0–5)
HCT: 44.7 % (ref 39.0–52.0)
Hemoglobin: 15.3 g/dL (ref 13.0–17.0)
Lymphocytes Relative: 29 % (ref 12–46)
Lymphs Abs: 2.3 10*3/uL (ref 0.7–4.0)
MCH: 30.9 pg (ref 26.0–34.0)
MCHC: 34.2 g/dL (ref 30.0–36.0)
MCV: 90.3 fL (ref 78.0–100.0)
Monocytes Absolute: 0.8 10*3/uL (ref 0.1–1.0)
Monocytes Relative: 10 % (ref 3–12)
Neutro Abs: 4.5 10*3/uL (ref 1.7–7.7)
Neutrophils Relative %: 55 % (ref 43–77)
Platelets: 164 10*3/uL (ref 150–400)
RBC: 4.95 MIL/uL (ref 4.22–5.81)
RDW: 13.9 % (ref 11.5–15.5)
WBC: 8.2 10*3/uL (ref 4.0–10.5)

## 2013-01-24 LAB — LIPID PANEL
Cholesterol: 180 mg/dL (ref 0–200)
HDL: 38 mg/dL — ABNORMAL LOW (ref >39)
LDL Cholesterol: 118 mg/dL — ABNORMAL HIGH (ref 0–99)
Total CHOL/HDL Ratio: 4.7 RATIO
Triglycerides: 118 mg/dL (ref <150)
VLDL: 24 mg/dL (ref 0–40)

## 2013-01-24 LAB — BASIC METABOLIC PANEL
BUN: 12 mg/dL (ref 6–23)
CO2: 29 mEq/L (ref 19–32)
Calcium: 8.7 mg/dL (ref 8.4–10.5)
Chloride: 106 mEq/L (ref 96–112)
Creatinine, Ser: 0.86 mg/dL (ref 0.50–1.35)
GFR calc Af Amer: >90 mL/min (ref >90)
GFR calc non Af Amer: 85 mL/min — ABNORMAL LOW (ref >90)
Glucose, Bld: 104 mg/dL — ABNORMAL HIGH (ref 70–99)
Potassium: 3.3 mEq/L — ABNORMAL LOW (ref 3.5–5.1)
Sodium: 142 mEq/L (ref 135–145)

## 2013-01-24 LAB — MAGNESIUM: Magnesium: 2 mg/dL (ref 1.5–2.5)

## 2013-01-24 LAB — URINALYSIS, ROUTINE W REFLEX MICROSCOPIC
Bilirubin Urine: NEGATIVE
Glucose, UA: NEGATIVE mg/dL
Hgb urine dipstick: NEGATIVE
Ketones, ur: NEGATIVE mg/dL
Leukocytes, UA: NEGATIVE
Nitrite: NEGATIVE
Protein, ur: NEGATIVE mg/dL
Specific Gravity, Urine: 1.012 (ref 1.005–1.030)
Urobilinogen, UA: 0.2 mg/dL (ref 0.0–1.0)
pH: 7 (ref 5.0–8.0)

## 2013-01-24 LAB — PROTIME-INR
INR: 1.11 (ref 0.00–1.49)
Prothrombin Time: 14.2 seconds (ref 11.6–15.2)

## 2013-01-24 LAB — PRO B NATRIURETIC PEPTIDE
Pro B Natriuretic peptide (BNP): 2648 pg/mL — ABNORMAL HIGH (ref 0–125)
Pro B Natriuretic peptide (BNP): 2201 pg/mL — ABNORMAL HIGH (ref 0–125)

## 2013-01-24 LAB — APTT: aPTT: 195 seconds — ABNORMAL HIGH (ref 24–37)

## 2013-01-24 LAB — TSH: TSH: 5.87 u[IU]/mL — ABNORMAL HIGH (ref 0.350–4.500)

## 2013-01-24 LAB — HEMOGLOBIN A1C
Hgb A1c MFr Bld: 5.5 % (ref <5.7)
Mean Plasma Glucose: 111 mg/dL (ref <117)

## 2013-01-24 MED ORDER — VITAMIN D3 25 MCG (1000 UNIT) PO TABS
1000.0000 [IU] | ORAL_TABLET | Freq: Every day | ORAL | Status: DC
  Filled 2013-01-24 (×3): qty 1

## 2013-01-24 MED ORDER — METOPROLOL TARTRATE 12.5 MG HALF TABLET
12.5000 mg | ORAL_TABLET | Freq: Two times a day (BID) | ORAL | Status: DC
  Administered 2013-01-24: 12.5 mg via ORAL
  Filled 2013-01-24 (×3): qty 1

## 2013-01-24 MED ORDER — METOPROLOL TARTRATE 25 MG PO TABS
25.0000 mg | ORAL_TABLET | Freq: Two times a day (BID) | ORAL | Status: DC
  Administered 2013-01-24 – 2013-01-25 (×3): 25 mg via ORAL
  Filled 2013-01-24 (×4): qty 1

## 2013-01-24 MED ORDER — ACETAMINOPHEN 325 MG PO TABS: 650.0000 mg | ORAL_TABLET | ORAL | Status: DC | PRN

## 2013-01-24 MED ORDER — RIVAROXABAN 20 MG PO TABS
20.0000 mg | ORAL_TABLET | Freq: Every day | ORAL | Status: DC
  Administered 2013-01-24 – 2013-01-26 (×3): 20 mg via ORAL
  Filled 2013-01-24 (×4): qty 1

## 2013-01-24 MED ORDER — POTASSIUM CHLORIDE CRYS ER 20 MEQ PO TBCR
40.0000 meq | EXTENDED_RELEASE_TABLET | Freq: Once | ORAL | Status: AC
  Administered 2013-01-24: 40 meq via ORAL
  Filled 2013-01-24 (×2): qty 2

## 2013-01-24 MED ORDER — ONDANSETRON HCL 4 MG/2ML IJ SOLN: 4.0000 mg | Freq: Four times a day (QID) | INTRAMUSCULAR | Status: DC | PRN

## 2013-01-24 MED ORDER — ASPIRIN 81 MG PO CHEW
81.0000 mg | CHEWABLE_TABLET | Freq: Every day | ORAL | Status: DC
  Administered 2013-01-24 – 2013-01-25 (×2): 81 mg via ORAL
  Filled 2013-01-24 (×2): qty 1

## 2013-01-24 MED ORDER — CHOLECALCIFEROL 25 MCG (1000 UT) PO CAPS: 1000.0000 [IU] | ORAL_CAPSULE | Freq: Every day | ORAL | Status: DC

## 2013-01-24 NOTE — Progress Notes (Signed)
Patient ID: [name] [name], male   DOB: 1941-07-17, 73 y.o.   MRN: [id number]    Subjective:  Denies SSCP, palpitations or Dyspnea   Objective:  Filed Vitals:   01/23/13 2354 01/24/13 0006 01/24/13 0520 01/24/13 0738  BP: 102/85  124/89 129/82  Pulse: 64  82   Temp: 97.6 F (36.4 C)  98.2 F (36.8 C)   TempSrc: Oral  Oral   Resp: 18  18   Height: 5\' 11"  (1.803 m)     Weight: 210 lb (95.255 kg)     SpO2: 100% 98% 96%     Intake/Output from previous day: No intake or output data in the 24 hours ending 01/24/13 4098  Physical Exam: Affect appropriate Healthy:  appears stated age HEENT: normal Neck supple with no adenopathy JVP normal no bruits no thyromegaly Lungs clear with no wheezing and good diaphragmatic motion Heart:  S1/S2 no murmur, no rub, gallop or click PMI normal Abdomen: benighn, BS positve, no tenderness, no AAA no bruit.  No HSM or HJR Distal pulses intact with no bruits No edema Neuro non-focal Skin warm and dry No muscular weakness   Lab Results: Basic Metabolic Panel:  Recent Labs  11/91/47 0054 01/24/13 0420  NA 140 142  K 3.3* 3.3*  CL 105 106  CO2 25 29  GLUCOSE 113* 104*  BUN 12 12  CREATININE 0.84 0.86  CALCIUM 8.7 8.7  MG 2.0  --    Liver Function Tests:  Recent Labs  01/24/13 0054  AST 31  ALT 76*  ALKPHOS 64  BILITOT 0.6  PROT 6.5  ALBUMIN 3.5    CBC:  Recent Labs  01/24/13 0054 01/24/13 0420  WBC 8.2 7.8  NEUTROABS 4.5  --   HGB 15.3 15.0  HCT 44.7 44.6  MCV 90.3 90.7  PLT 164 183   Cardiac Enzymes:  Recent Labs  01/23/13 1907 01/24/13 0054 01/24/13 0420  TROPONINI <0.30 <0.30 <0.30    Fasting Lipid Panel:  Recent Labs  01/24/13 0420  CHOL 180  HDL 38*  LDLCALC 118*  TRIG 118  CHOLHDL 4.7    Imaging: Dg Chest Portable 1 View  01/23/2013   *RADIOLOGY REPORT*  Clinical Data: Tachycardia and chest pain  PORTABLE CHEST - 1 VIEW  Comparison: Chest radiograph 10/21/2012 and 04/10/2011   Findings: There is stable mild cardiomegaly.  Mild pulmonary vascular congestion is present.  Streaky linear atelectasis is seen at both lung bases.  No definite pulmonary edema.  No visible pleural effusion or pneumothorax.  Cardiac leads project over the chest.  No acute bony abnormalities identified.  IMPRESSION: Mild cardiomegaly pulmonary vascular congestion.  Streaky bibasilar atelectasis.   Original Report Authenticated By: Britta Mccreedy, M.D.    Cardiac Studies:  ECG:  Rapid afib with no ischemic changes   Telemetry: Afib rates 100-125  Echo:   Medications:   . aspirin  81 mg Oral Daily  . cholecalciferol  1,000 Units Oral Daily  . metoprolol tartrate  25 mg Oral BID     . diltiazem (CARDIZEM) infusion 5 mg/hr (01/23/13 1948)  . heparin 1,400 Units/hr (01/24/13 0025)    Assessment/Plan:  Afib:  Rate not controlled  Increase metoprolol and cardizem drip.  Pharm to see d/c heparin needs two doses of xarelto before [facility] Will tentatively schedule for Friday.  Echo to reassess atrial size and EF   Charlton Haws 01/24/2013, 7:42 AM

## 2013-01-24 NOTE — Progress Notes (Signed)
ANTICOAGULATION CONSULT NOTE - Initial Consult  Pharmacy Consult for xarelto Indication: atrial fibrillation  Allergies  Allergen Reactions  . Benazepril     cough    Patient Measurements: Height: 5\' 11"  (180.3 cm) Weight: 210 lb (95.255 kg) IBW/kg (Calculated) : 75.3 Heparin Dosing Weight: 94 kg  Vital Signs: Temp: 98.2 F (36.8 C) (05/14 0520) Temp src: Oral (05/14 0520) BP: 130/80 mmHg (05/14 0846) Pulse Rate: 82 (05/14 0520)  Labs:  Recent Labs  01/23/13 1837 01/23/13 1907 01/24/13 0054 01/24/13 0420  HGB 16.2  --  15.3 15.0  HCT 46.1  --  44.7 44.6  PLT 193  --  164 183  APTT  --   --  195*  --   LABPROT 12.8  --  14.2  --   INR 0.97  --  1.11  --   CREATININE 1.07  --  0.84 0.86  TROPONINI  --  <0.30 <0.30 <0.30    Estimated Creatinine Clearance: 91.5 ml/min (by C-G formula based on Cr of 0.86).   Medical History: Past Medical History  Diagnosis Date  . HTN (hypertension)   . A-fib     The Pt had new onset Afib in 07/2008. He underwent ibutilide cardioversion successfully. He has had 1-2 episodes/year that are short-lived that likely are  Afib w/RVR. CHADSVASC score 2.  . Hypercholesteremia   . Aortic stenosis, mild     Echo 07/11: 55-60%, mild LV hypertrophy, mild aortic stenosis w/mean gradient and peak gradient 36 mmHg.  . Osteoarthritis   . LBP (low back pain)   . Chest pain     ETT-myoview 12/11 w/exercise, no chest pain, no significant ST changes, EF 69%, no evidence for ischemia or infarction.    Medications:  Scheduled:  . aspirin  81 mg Oral Daily  . cholecalciferol  1,000 Units Oral Daily  . metoprolol tartrate  25 mg Oral BID    Assessment: 72 yo male with atrial fibrillation with RVR. Pt was started on heparin but now transition to xarelto. Baseline labs are wnl.  Goal of Therapy:  Monitor platelets by anticoagulation protocol: Yes   Plan:   Xarelto 20mg  PO qday Monitor for bleeding

## 2013-01-25 DIAGNOSIS — I4891 Unspecified atrial fibrillation: Secondary | ICD-10-CM | POA: Diagnosis not present

## 2013-01-25 LAB — CBC
HCT: 46.7 % (ref 39.0–52.0)
Hemoglobin: 16.3 g/dL (ref 13.0–17.0)
MCH: 31.5 pg (ref 26.0–34.0)
MCHC: 34.9 g/dL (ref 30.0–36.0)
MCV: 90.3 fL (ref 78.0–100.0)
Platelets: 192 10*3/uL (ref 150–400)
RBC: 5.17 MIL/uL (ref 4.22–5.81)
RDW: 14 % (ref 11.5–15.5)
WBC: 7.6 10*3/uL (ref 4.0–10.5)

## 2013-01-25 MED ORDER — DILTIAZEM HCL 30 MG PO TABS
30.0000 mg | ORAL_TABLET | Freq: Three times a day (TID) | ORAL | Status: DC
  Administered 2013-01-25 – 2013-01-26 (×4): 30 mg via ORAL
  Filled 2013-01-25 (×7): qty 1

## 2013-01-25 MED ORDER — METOPROLOL TARTRATE 50 MG PO TABS
50.0000 mg | ORAL_TABLET | Freq: Two times a day (BID) | ORAL | Status: DC
  Administered 2013-01-25: 50 mg via ORAL
  Filled 2013-01-25 (×3): qty 1

## 2013-01-25 NOTE — Progress Notes (Signed)
Patient ID: [name] [name], male   DOB: December 04, 1940, 72 y.o.   MRN: [id number]    Subjective:  Denies SSCP, palpitations or Dyspnea Sheets irritating him and noise  Objective:  Filed Vitals:   01/24/13 1413 01/24/13 2100 01/25/13 0500 01/25/13 0718  BP: 136/78 131/91 105/65 112/70  Pulse: 90 92 106 136  Temp: 98.5 F (36.9 C) 98.6 F (37 C) 98.7 F (37.1 C)   TempSrc: Oral     Resp: 18 18 18    Height:      Weight:   203 lb 4.8 oz (92.216 kg)   SpO2: 96% 95% 95%     Intake/Output from previous day:  Intake/Output Summary (Last 24 hours) at 01/25/13 0815 Last data filed at 01/25/13 0720  Gross per 24 hour  Intake    960 ml  Output   2200 ml  Net  -1240 ml    Physical Exam: Affect appropriate Healthy:  appears stated age HEENT: normal Neck supple with no adenopathy JVP normal no bruits no thyromegaly Lungs clear with no wheezing and good diaphragmatic motion Heart:  S1/S2 no murmur, no rub, gallop or click PMI normal Abdomen: benighn, BS positve, no tenderness, no AAA no bruit.  No HSM or HJR Distal pulses intact with no bruits No edema Neuro non-focal Skin warm and dry No muscular weakness   Lab Results: Basic Metabolic Panel:  Recent Labs  40/98/11 0054 01/24/13 0420  NA 140 142  K 3.3* 3.3*  CL 105 106  CO2 25 29  GLUCOSE 113* 104*  BUN 12 12  CREATININE 0.84 0.86  CALCIUM 8.7 8.7  MG 2.0  --    Liver Function Tests:  Recent Labs  01/24/13 0054  AST 31  ALT 76*  ALKPHOS 64  BILITOT 0.6  PROT 6.5  ALBUMIN 3.5    CBC:  Recent Labs  01/24/13 0054  01/24/13 1059 01/25/13 0542  WBC 8.2  < > 7.3 7.6  NEUTROABS 4.5  --   --   --   HGB 15.3  < > 16.2 16.3  HCT 44.7  < > 46.8 46.7  MCV 90.3  < > 90.0 90.3  PLT 164  < > 185 192  < > = values in this interval not displayed. Cardiac Enzymes:  Recent Labs  01/23/13 1907 01/24/13 0054 01/24/13 0420  TROPONINI <0.30 <0.30 <0.30    Fasting Lipid Panel:  Recent Labs   01/24/13 0420  CHOL 180  HDL 38*  LDLCALC 118*  TRIG 118  CHOLHDL 4.7    Imaging: Dg Chest Portable 1 View  01/23/2013   *RADIOLOGY REPORT*  Clinical Data: Tachycardia and chest pain  PORTABLE CHEST - 1 VIEW  Comparison: Chest radiograph 10/21/2012 and 04/10/2011  Findings: There is stable mild cardiomegaly.  Mild pulmonary vascular congestion is present.  Streaky linear atelectasis is seen at both lung bases.  No definite pulmonary edema.  No visible pleural effusion or pneumothorax.  Cardiac leads project over the chest.  No acute bony abnormalities identified.  IMPRESSION: Mild cardiomegaly pulmonary vascular congestion.  Streaky bibasilar atelectasis.   Original Report Authenticated By: Britta Mccreedy, M.D.    Cardiac Studies:  ECG:  Rapid afib with no ischemic changes   Telemetry: Afib rates 100-125  Echo:   Medications:   . aspirin  81 mg Oral Daily  . cholecalciferol  1,000 Units Oral Daily  . diltiazem  30 mg Oral Q8H  . metoprolol tartrate  50 mg Oral BID  . rivaroxaban  20 mg Oral Q breakfast        Assessment/Plan:  Afib:  Rate not controlled  Increase metoprolol and change cardizem to PO .  2nd dose of xarelto today and TEE/DCC with Dr Tenny Craw tomorrow Orders written And went to endo myself to put on schedule Ambulate May shower.     Charlton Haws 01/25/2013, 8:15 AM

## 2013-01-25 NOTE — Progress Notes (Signed)
Pt ambulating in room. HR elevated to 180s. Pt asymptomatic and states that "he cannot feel his heart that fast". HR returned to 110s-130s once getting in bed. VSS. Will continue to monitor. Levonne Spiller, RN

## 2013-01-25 NOTE — Care Management Note (Unsigned)
    Page 1 of 1   01/25/2013     10:14:56 AM   CARE MANAGEMENT NOTE 01/25/2013  Patient:  [name] [name]   Account Number:  [id number]  Date Initiated:  01/25/2013  Documentation initiated by:  GRAVES-BIGELOW,Octavio Matheney  Subjective/Objective Assessment:   Pt admitted with cp and afib. Initiated on cardizem gtt and plans for TEE cardioversion Friday. Pt to be on xarelto at d/c.     Action/Plan:   CM has a benefits check in process and will make pt aware once complete.   Anticipated DC Date:  01/27/2013   Anticipated DC Plan:  HOME/SELF CARE      DC Planning Services  CM consult      Choice offered to / List presented to:             Status of service:  In process, will continue to follow Medicare Important Message given?   (If response is "NO", the following Medicare IM given date fields will be blank) Date Medicare IM given:   Date Additional Medicare IM given:    Discharge Disposition:    Per UR Regulation:  Reviewed for med. necessity/level of care/duration of stay  If discussed at Long Length of Stay Meetings, dates discussed:    Comments:

## 2013-01-25 NOTE — Progress Notes (Signed)
Pt sitting on side of bed. HR afib in 130s-160s. Bjorn Loser, PA notified. Order to give tonights dose of metoprolol now. Levonne Spiller, RN

## 2013-01-26 ENCOUNTER — Encounter (HOSPITAL_COMMUNITY)
Admission: EM | Disposition: A | Discharge status: Home / Self Care | Payer: Self-pay | Source: Home / Self Care | Attending: Cardiology

## 2013-01-26 ENCOUNTER — Encounter (HOSPITAL_COMMUNITY): Payer: Self-pay | Admitting: Anesthesiology

## 2013-01-26 ENCOUNTER — Inpatient Hospital Stay (HOSPITAL_COMMUNITY): Payer: Medicare Other | Admitting: Anesthesiology

## 2013-01-26 ENCOUNTER — Ambulatory Visit: Payer: Medicare Other | Admitting: Internal Medicine

## 2013-01-26 DIAGNOSIS — I359 Nonrheumatic aortic valve disorder, unspecified: Secondary | ICD-10-CM | POA: Diagnosis not present

## 2013-01-26 DIAGNOSIS — I4891 Unspecified atrial fibrillation: Secondary | ICD-10-CM

## 2013-01-26 DIAGNOSIS — E7800 Pure hypercholesterolemia, unspecified: Secondary | ICD-10-CM | POA: Diagnosis not present

## 2013-01-26 DIAGNOSIS — I10 Essential (primary) hypertension: Secondary | ICD-10-CM | POA: Diagnosis not present

## 2013-01-26 HISTORY — PX: CARDIOVERSION: SHX1299

## 2013-01-26 HISTORY — PX: TEE WITHOUT CARDIOVERSION: SHX5443

## 2013-01-26 LAB — CBC
HCT: 49.4 % (ref 39.0–52.0)
Hemoglobin: 17 g/dL (ref 13.0–17.0)
MCH: 31.1 pg (ref 26.0–34.0)
MCHC: 34.4 g/dL (ref 30.0–36.0)
MCV: 90.5 fL (ref 78.0–100.0)
Platelets: 181 10*3/uL (ref 150–400)
RBC: 5.46 MIL/uL (ref 4.22–5.81)
RDW: 13.9 % (ref 11.5–15.5)
WBC: 7.8 10*3/uL (ref 4.0–10.5)

## 2013-01-26 LAB — BASIC METABOLIC PANEL
BUN: 16 mg/dL (ref 6–23)
CO2: 29 mEq/L (ref 19–32)
Calcium: 9.1 mg/dL (ref 8.4–10.5)
Chloride: 105 mEq/L (ref 96–112)
Creatinine, Ser: 1 mg/dL (ref 0.50–1.35)
GFR calc Af Amer: 85 mL/min — ABNORMAL LOW (ref >90)
GFR calc non Af Amer: 73 mL/min — ABNORMAL LOW (ref >90)
Glucose, Bld: 115 mg/dL — ABNORMAL HIGH (ref 70–99)
Potassium: 3.9 mEq/L (ref 3.5–5.1)
Sodium: 141 mEq/L (ref 135–145)

## 2013-01-26 LAB — TSH: TSH: 3.65 u[IU]/mL (ref 0.350–4.500)

## 2013-01-26 LAB — T4, FREE: Free T4: 1.09 ng/dL (ref 0.80–1.80)

## 2013-01-26 LAB — T3, FREE: T3, Free: 2.8 pg/mL (ref 2.3–4.2)

## 2013-01-26 SURGERY — ECHOCARDIOGRAM, TRANSESOPHAGEAL
Anesthesia: General

## 2013-01-26 MED ORDER — MIDAZOLAM HCL 5 MG/ML IJ SOLN
INTRAMUSCULAR | Status: AC
  Filled 2013-01-26: qty 2

## 2013-01-26 MED ORDER — METOPROLOL SUCCINATE ER 50 MG PO TB24: 75.0000 mg | ORAL_TABLET | Freq: Every day | ORAL | Status: DC

## 2013-01-26 MED ORDER — MIDAZOLAM HCL 10 MG/2ML IJ SOLN
INTRAMUSCULAR | Status: DC | PRN
  Administered 2013-01-26 (×2): 2 mg via INTRAVENOUS

## 2013-01-26 MED ORDER — FENTANYL CITRATE 0.05 MG/ML IJ SOLN
INTRAMUSCULAR | Status: AC
  Filled 2013-01-26: qty 2

## 2013-01-26 MED ORDER — METOPROLOL SUCCINATE ER 50 MG PO TB24
50.0000 mg | ORAL_TABLET | Freq: Two times a day (BID) | ORAL | Status: DC
  Administered 2013-01-26: 50 mg via ORAL
  Filled 2013-01-26 (×2): qty 1

## 2013-01-26 MED ORDER — DILTIAZEM HCL 30 MG PO TABS
30.0000 mg | ORAL_TABLET | Freq: Once | ORAL | Status: AC
  Administered 2013-01-26: 30 mg via ORAL
  Filled 2013-01-26: qty 1

## 2013-01-26 MED ORDER — SODIUM CHLORIDE 0.9 % IV SOLN: INTRAVENOUS | Status: DC

## 2013-01-26 MED ORDER — RIVAROXABAN 20 MG PO TABS: 20.0000 mg | ORAL_TABLET | Freq: Every day | ORAL | Status: DC

## 2013-01-26 MED ORDER — METOPROLOL SUCCINATE ER 50 MG PO TB24: 50.0000 mg | ORAL_TABLET | Freq: Two times a day (BID) | ORAL | Status: DC

## 2013-01-26 MED ORDER — SODIUM CHLORIDE 0.9 % IV SOLN
INTRAVENOUS | Status: DC | PRN
  Administered 2013-01-26: 10:00:00 via INTRAVENOUS

## 2013-01-26 MED ORDER — PROPOFOL 10 MG/ML IV BOLUS
INTRAVENOUS | Status: DC | PRN
  Administered 2013-01-26: 70 mg via INTRAVENOUS

## 2013-01-26 MED ORDER — BUTAMBEN-TETRACAINE-BENZOCAINE 2-2-14 % EX AERO
INHALATION_SPRAY | CUTANEOUS | Status: DC | PRN
  Administered 2013-01-26: 2 via TOPICAL

## 2013-01-26 MED ORDER — FENTANYL CITRATE 0.05 MG/ML IJ SOLN
INTRAMUSCULAR | Status: DC | PRN
  Administered 2013-01-26 (×2): 25 ug via INTRAVENOUS

## 2013-01-26 NOTE — Preoperative (Addendum)
Beta Blockers   Reason not to administer Beta Blockers:metoprolol 01/25/13 1744

## 2013-01-26 NOTE — Progress Notes (Signed)
Pt resting in bed. HR afib in the 120's-150's. Md on call made aware. New order received. Will cont to monitor pt.

## 2013-01-26 NOTE — CV Procedure (Signed)
Procedure: TEE  Indication: Atrial fibrillation, pre-cardioversion to rule out LA thrombus.   Sedation: Fentanyl 50 mcg IV, Versed 2 mg IV.   Findings: Please see report in echo section for full details. The patient was in rapid atrial fibrillation. Normal LV size with mild LV hypertrophy.  EF 55%.  Normal RV size and systolic function.  Trivial MR.  The aortic valve was bicuspid with apparently mild AS, mild AI.  Ascending aorta 4.5 cm.  No LAA thrombus.    No complications, proceed to DCCV.   Marca Ancona 01/26/2013 10:06 AM

## 2013-01-26 NOTE — H&P (View-Only) (Signed)
Patient ID: [name] [name], male   DOB: 07/21/1941, 72 y.o.   MRN: [id number]    Subjective:  Denies SSCP, palpitations or Dyspnea Sheets irritating him and noise  Objective:  Filed Vitals:   01/24/13 1413 01/24/13 2100 01/25/13 0500 01/25/13 0718  BP: 136/78 131/91 105/65 112/70  Pulse: 90 92 106 136  Temp: 98.5 F (36.9 C) 98.6 F (37 C) 98.7 F (37.1 C)   TempSrc: Oral     Resp: 18 18 18   Height:      Weight:   203 lb 4.8 oz (92.216 kg)   SpO2: 96% 95% 95%     Intake/Output from previous day:  Intake/Output Summary (Last 24 hours) at 01/25/13 0815 Last data filed at 01/25/13 0720  Gross per 24 hour  Intake    960 ml  Output   2200 ml  Net  -1240 ml    Physical Exam: Affect appropriate Healthy:  appears stated age HEENT: normal Neck supple with no adenopathy JVP normal no bruits no thyromegaly Lungs clear with no wheezing and good diaphragmatic motion Heart:  S1/S2 no murmur, no rub, gallop or click PMI normal Abdomen: benighn, BS positve, no tenderness, no AAA no bruit.  No HSM or HJR Distal pulses intact with no bruits No edema Neuro non-focal Skin warm and dry No muscular weakness   Lab Results: Basic Metabolic Panel:  Recent Labs  01/24/13 0054 01/24/13 0420  NA 140 142  K 3.3* 3.3*  CL 105 106  CO2 25 29  GLUCOSE 113* 104*  BUN 12 12  CREATININE 0.84 0.86  CALCIUM 8.7 8.7  MG 2.0  --    Liver Function Tests:  Recent Labs  01/24/13 0054  AST 31  ALT 76*  ALKPHOS 64  BILITOT 0.6  PROT 6.5  ALBUMIN 3.5    CBC:  Recent Labs  01/24/13 0054  01/24/13 1059 01/25/13 0542  WBC 8.2  < > 7.3 7.6  NEUTROABS 4.5  --   --   --   HGB 15.3  < > 16.2 16.3  HCT 44.7  < > 46.8 46.7  MCV 90.3  < > 90.0 90.3  PLT 164  < > 185 192  < > = values in this interval not displayed. Cardiac Enzymes:  Recent Labs  01/23/13 1907 01/24/13 0054 01/24/13 0420  TROPONINI <0.30 <0.30 <0.30    Fasting Lipid Panel:  Recent Labs   01/24/13 0420  CHOL 180  HDL 38*  LDLCALC 118*  TRIG 118  CHOLHDL 4.7    Imaging: Dg Chest Portable 1 View  01/23/2013   *RADIOLOGY REPORT*  Clinical Data: Tachycardia and chest pain  PORTABLE CHEST - 1 VIEW  Comparison: Chest radiograph 10/21/2012 and 04/10/2011  Findings: There is stable mild cardiomegaly.  Mild pulmonary vascular congestion is present.  Streaky linear atelectasis is seen at both lung bases.  No definite pulmonary edema.  No visible pleural effusion or pneumothorax.  Cardiac leads project over the chest.  No acute bony abnormalities identified.  IMPRESSION: Mild cardiomegaly pulmonary vascular congestion.  Streaky bibasilar atelectasis.   Original Report Authenticated By: Susan Turner, M.D.    Cardiac Studies:  ECG:  Rapid afib with no ischemic changes   Telemetry: Afib rates 100-125  Echo:   Medications:   . aspirin  81 mg Oral Daily  . cholecalciferol  1,000 Units Oral Daily  . diltiazem  30 mg Oral Q8H  . metoprolol tartrate  50 mg Oral BID  . rivaroxaban    20 mg Oral Q breakfast        Assessment/Plan:  Afib:  Rate not controlled  Increase metoprolol and change cardizem to PO .  2nd dose of xarelto today and TEE/DCC with Dr Ross tomorrow Orders written And went to endo myself to put on schedule Ambulate May shower.     [name] 01/25/2013, 8:15 AM     

## 2013-01-26 NOTE — Addendum Note (Signed)
Addendum created 01/26/13 1029 by Adria Dill, CRNA   Modules edited: Anesthesia Events

## 2013-01-26 NOTE — Progress Notes (Signed)
  Echocardiogram Echocardiogram Transesophageal has been performed.  [name] [name] 01/26/2013, 11:25 AM

## 2013-01-26 NOTE — Interval H&P Note (Signed)
History and Physical Interval Note:  01/26/2013 9:41 AM  [name] [name]  has presented today for surgery, with the diagnosis of atrl fib  The various methods of treatment have been discussed with the patient and family. After consideration of risks, benefits and other options for treatment, the patient has consented to  Procedure(s): TRANSESOPHAGEAL ECHOCARDIOGRAM (TEE) (N/A) CARDIOVERSION (N/A) as a surgical intervention .  The patient's history has been reviewed, patient examined, no change in status, stable for surgery.  I have reviewed the patient's chart and labs.  Questions were answered to the patient's satisfaction.     Dalton [employer]

## 2013-01-26 NOTE — Anesthesia Preprocedure Evaluation (Addendum)
Anesthesia Evaluation  Patient identified by MRN, date of birth, ID band Patient awake    Reviewed: Allergy & Precautions, H&P , NPO status , Patient's Chart, lab work & pertinent test results, reviewed documented beta blocker date and time   History of Anesthesia Complications Negative for: history of anesthetic complications  Airway Mallampati: II TM Distance: >3 FB Neck ROM: Full    Dental  (+) Teeth Intact and Dental Advisory Given   Pulmonary shortness of breath, Recent URI  (was rx amoxicillian ), Residual Cough,  PORTABLE CHEST - 1 VIEW 01/23/13   Comparison: Chest radiograph 10/21/2012 and 04/10/2011   Findings: There is stable mild cardiomegaly.  Mild pulmonary vascular congestion is present.  Streaky linear atelectasis is seen at both lung bases.  No definite pulmonary edema.  No visible pleural effusion or pneumothorax.  Cardiac leads project over the chest.  No acute bony abnormalities identified.   IMPRESSION: Mild cardiomegaly pulmonary vascular congestion.  Streaky bibasilar atelectasis.  breath sounds clear to auscultation  Pulmonary exam normal       Cardiovascular hypertension (was not taking BB prior to admission), Pt. on medications and Pt. on home beta blockers + Peripheral Vascular Disease + dysrhythmias (normal LVF today, bicuspid aortic valve with mild AS) Atrial Fibrillation + Valvular Problems/Murmurs AS Rhythm:Irregular Rate:Tachycardia  Today echo: Bicuspid AV Dilated aortic root Ascending aneurysm  Mild AI/mild AS Mild MR  Study Conclusions    - Left ventricle: The cavity size was normal. Wall thickness was    increased in a pattern of mild LVH. Systolic function was normal.    The estimated ejection fraction was in the range of 55% to 60%.  - Aortic valve: There was mild to moderate stenosis. Mild    regurgitation. Valve area: 1.98cm^2(VTI). Valve area: 1.73cm^2    (Vmax).  - Left atrium:  The atrium was mildly dilated.  - Atrial septum: No defect or patent foramen ovale was identified.  - Pulmonary arteries: PA peak pressure: 34mm Hg (S).  Transthoracic echocardiography. M-mode, complete 2D, spectral  Doppler, and color Doppler. Height: Height: 185.4cm. Height: 73in.  Weight: Weight: 98.4kg. Weight: 216.5lb. Body mass index: BMI:  28.6kg/m^2. Body surface area: BSA: 2.48m^2. Blood pressure: 136/70.  Patient status: Outpatient. Location: [facility]   --------------------------------------------------------------------   --------------------------------------------------------------------  Left ventricle: The cavity size was normal. Wall thickness was  increased in a pattern of mild LVH. Systolic function was normal.  The estimated ejection fraction was in the range of 55% to 60%.   --------------------------------------------------------------------  Aortic valve: Moderately calcified leaflets. Doppler: There was mild  to moderate stenosis. Mild regurgitation.  VTI ratio of LVOT to  aortic valve: 0.57. Valve area: 1.98cm^2(VTI). Indexed valve area:  0.89cm^2/m^2 (VTI). Valve area: 1.73cm^2 (Vmax). Indexed valve area:  0.78cm^2/m^2 (Vmax).  Mean gradient: 19mm Hg (S). Peak gradient:  36mm Hg (S).   --------------------------------------------------------------------  Aorta: The aorta was normal, not dilated, and non-diseased. .    Neuro/Psych negative neurological ROS  negative psych ROS   GI/Hepatic negative GI ROS, Neg liver ROS, GERD-  Controlled,  Endo/Other  negative endocrine ROS  Renal/GU negative Renal ROS     Musculoskeletal negative musculoskeletal ROS (+)   Abdominal   Peds  Hematology  (+) Blood dyscrasia (heparin gtt), ,   Anesthesia Other Findings   Reproductive/Obstetrics                      Anesthesia Physical Anesthesia Plan  ASA: III  Anesthesia Plan: General   Post-op Pain Management:    Induction:  Intravenous  Airway Management Planned: Mask and Natural Airway  Additional Equipment:   Intra-op Plan:   Post-operative Plan:   Informed Consent: I have reviewed the patients History and Physical, chart, labs and discussed the procedure including the risks, benefits and alternatives for the proposed anesthesia with the patient or authorized representative who has indicated his/her understanding and acceptance.   Dental advisory given  Plan Discussed with: CRNA, Anesthesiologist and Surgeon  Anesthesia Plan Comments: (Plan routine monitors, GA for CV)       Anesthesia Quick Evaluation

## 2013-01-26 NOTE — Anesthesia Postprocedure Evaluation (Signed)
   Anesthesia Post-op Note  Patient: [name] [name]  Procedure(s) Performed: Procedure(s): TRANSESOPHAGEAL ECHOCARDIOGRAM (TEE) (N/A) CARDIOVERSION (N/A)  Patient Location: Endoscopy Unit  Anesthesia Type:General  Level of Consciousness: awake, alert , oriented and patient cooperative  Airway and Oxygen Therapy: Patient Spontanous Breathing and Patient connected to nasal cannula oxygen  Post-op Pain: none  Post-op Assessment: Post-op Vital signs reviewed, Patient's Cardiovascular Status Stable, Respiratory Function Stable, Patent Airway and No signs of Nausea or vomiting  Post-op Vital Signs: Reviewed and stable  Complications: No apparent anesthesia complications

## 2013-01-26 NOTE — Transfer of Care (Signed)
Immediate Anesthesia Transfer of Care Note  Patient: [name] [name]  Procedure(s) Performed: Procedure(s): TRANSESOPHAGEAL ECHOCARDIOGRAM (TEE) (N/A) CARDIOVERSION (N/A)  Patient Location: Endoscopy Unit  Anesthesia Type:General  Level of Consciousness: awake, alert  and patient cooperative  Airway & Oxygen Therapy: Patient Spontanous Breathing and Patient connected to nasal cannula oxygen  Post-op Assessment: Report given to PACU RN and Post -op Vital signs reviewed and stable  Post vital signs: Reviewed and stable  Complications: No apparent anesthesia complications

## 2013-01-26 NOTE — Procedures (Addendum)
Electrical Cardioversion Procedure Note [name] [name] [id number] May 09, 1941  Procedure: Electrical Cardioversion Indications:  Atrial Fibrillation.  No LAA thrombus by TEE and he has had 3 doses of Xarelto prior.   Procedure Details Consent: Risks of procedure as well as the alternatives and risks of each were explained to the (patient/caregiver).  Consent for procedure obtained. Time Out: Verified patient identification, verified procedure, site/side was marked, verified correct patient position, special equipment/implants available, medications/allergies/relevent history reviewed, required imaging and test results available.  Performed  Patient placed on cardiac monitor, pulse oximetry, supplemental oxygen as necessary.  Sedation given: Propofol per anesthesiology. Pacer pads placed anterior and posterior chest.  Cardioverted 1 time(s).  Cardioverted at 200J.  Evaluation Findings: Post procedure EKG shows: NSR Complications: None Patient did tolerate procedure well.  He can potentially go home this afternoon if he is walking and feeling good.  He can stop verapamil and go home on Toprol XL 50 mg bid and Xarelto 20 mg daily.  I need to see him back in the office in 2 weeks, can double book.   Marca Ancona 01/26/2013, 10:12 AM

## 2013-01-26 NOTE — Discharge Summary (Signed)
Patient ID: [name] [name],  MRN: [id number], DOB/AGE: 1941-09-08 72 y.o.  Admit date: 01/23/2013 Discharge date: 01/26/2013  Primary Care Provider: Sonda Primes Primary Cardiologist: Golden Circle, MD  Discharge Diagnoses Principal Problem:   Atrial fibrillation with RVR  **Xarelto initiated this admission  **s/p TEE/DCCV this admission Active Problems:   HYPERLIPIDEMIA   HYPERTENSION   AORTIC STENOSIS  Allergies Allergies  Allergen Reactions  . Benazepril     cough   Procedures  Transesophageal Echocardiogram and Cardioversion 01/26/2013  Indication: Atrial fibrillation, pre-cardioversion to rule out LA thrombus.   Sedation: Fentanyl 50 mcg IV, Versed 2 mg IV.   Findings:  Normal LV size with mild LV hypertrophy.  EF 55%.  Normal RV size and systolic function.  Trivial MR.  The aortic valve was bicuspid with apparently mild AS, mild AI.  Ascending aorta 4.5 cm.  No LAA thrombus.    Procedure: Electrical Cardioversion  Cardioverted 1 time(s).  Cardioverted at 200J.  Evaluation Findings: Post procedure EKG shows: NSR Complications: None Patient did tolerate procedure well. _____________  History of Present Illness  72 y/o male with history of paroxysmal atrial fibrillation and hypertension.  He was in his usual state of health until approximately 1 week prior to admission when he began to experience upper respiratory difficulty and cough.  He saw his PCP and was placed on prednisone and amoxicillin, which he took for 3 days prior to discontinuing because he didn't feel that hit was helping.  On 5/11, he began to experience palpitations, which persisted over the subsequent days.  He presented to the [facility] ED on 5/13 and was found to be in afib with RVR with rates in the 160's.  He was evaluated by cardiology and admitted for further evaluation and management.  Hospital Course  Following admission, pt was initially placed on IV diltiazem and heparin.  Rate remained poorly  controlled and it was felt that he would require TEE and cardioversion.  Heparin was discontinued in favor of Xarelto and arrangements were made for TEE/DCCV to take placed after 3 doses of Xarelto.  Pts rate remained difficult to control, elevating to the 180's with ambulation.  He was maintained on oral beta blocker along with IV diltiazem.  This morning, he underwent TEE revealing normal LV function and no evidence of left atrial appendage thrombus.  Cardioversion was then successfully carried out using 1, 200 Joule shock.  Post-cardioversion, pt has maintained sinus rhythm and has been ambulating without difficulty.  Calcium channel blocker therapy has been discontinued and instead he has been placed on Toprol XL 50mg  BID.  He will continue on Xarelto and be discharged this afternoon in good condition.  Discharge Vitals Blood pressure 130/85, pulse 153, temperature 98.1 F (36.7 C), temperature source Oral, resp. rate 69, height 5\' 11"  (1.803 m), weight 202 lb 6.4 oz (91.808 kg), SpO2 96.00%.  Filed Weights   01/23/13 2354 01/25/13 0500 01/26/13 0546  Weight: 210 lb (95.255 kg) 203 lb 4.8 oz (92.216 kg) 202 lb 6.4 oz (91.808 kg)   Labs  CBC  Recent Labs  01/24/13 0054  01/25/13 0542 01/26/13 0520  WBC 8.2  < > 7.6 7.8  NEUTROABS 4.5  --   --   --   HGB 15.3  < > 16.3 17.0  HCT 44.7  < > 46.7 49.4  MCV 90.3  < > 90.3 90.5  PLT 164  < > 192 181  < > = values in this interval not displayed. Basic Metabolic  Panel  Recent Labs  01/24/13 0054 01/24/13 0420 01/26/13 0520  NA 140 142 141  K 3.3* 3.3* 3.9  CL 105 106 105  CO2 25 29 29   GLUCOSE 113* 104* 115*  BUN 12 12 16   CREATININE 0.84 0.86 1.00  CALCIUM 8.7 8.7 9.1  MG 2.0  --   --    Liver Function Tests  Recent Labs  01/24/13 0054  AST 31  ALT 76*  ALKPHOS 64  BILITOT 0.6  PROT 6.5  ALBUMIN 3.5   Cardiac Enzymes  Recent Labs  01/23/13 1907 01/24/13 0054 01/24/13 0420  TROPONINI <0.30 <0.30 <0.30    Hemoglobin A1C  Recent Labs  01/24/13 0054  HGBA1C 5.5   Fasting Lipid Panel  Recent Labs  01/24/13 0420  CHOL 180  HDL 38*  LDLCALC 118*  TRIG 118  CHOLHDL 4.7   Thyroid Function Tests  Recent Labs  01/24/13 0054  TSH 5.870*   Disposition  Pt is being discharged home today in good condition.  Follow-up Plans & Appointments  Follow-up Information   Follow up with Marca Ancona, MD On 02/14/2013. (4:30 PM)    Contact information:   [address] [place] [address] [place] [address] [phone]       Follow up with Sonda Primes, MD. (as scheduled.)    Contact information:   [address] [facility] [address] [place] [address] [phone]      Discharge Medications    Medication List    STOP taking these medications       amoxicillin 875 MG tablet  Commonly known as:  AMOXIL     aspirin 325 MG tablet     verapamil 240 MG CR tablet  Commonly known as:  CALAN-SR      TAKE these medications       Cholecalciferol 1000 UNITS capsule  Take 1,000 Units by mouth daily.     clonazePAM 0.25 MG disintegrating tablet  Commonly known as:  KLONOPIN  Take 1 tablet (0.25 mg total) by mouth 2 (two) times daily as needed. Anxiety     loratadine 10 MG tablet  Commonly known as:  CLARITIN  Take 1 tablet (10 mg total) by mouth daily.     metoprolol succinate 50 MG 24 hr tablet  Commonly known as:  TOPROL-XL  Take 1 tablet (50 mg total) by mouth 2 (two) times daily.     Rivaroxaban 20 MG Tabs  Commonly known as:  XARELTO  Take 1 tablet (20 mg total) by mouth daily with breakfast.       Outstanding Labs/Studies  None  Duration of Discharge Encounter   Greater than 30 minutes including physician time.  Signed, Nicolasa Ducking NP 01/26/2013, 1:40 PM

## 2013-01-30 ENCOUNTER — Ambulatory Visit (INDEPENDENT_AMBULATORY_CARE_PROVIDER_SITE_OTHER): Payer: BC Managed Care – PPO | Admitting: Internal Medicine

## 2013-01-30 ENCOUNTER — Encounter: Payer: Self-pay | Admitting: Internal Medicine

## 2013-01-30 VITALS — BP 150/88 | HR 76 | Temp 97.9°F | Resp 16 | Wt 209.0 lb

## 2013-01-30 DIAGNOSIS — I4891 Unspecified atrial fibrillation: Secondary | ICD-10-CM

## 2013-01-30 DIAGNOSIS — I10 Essential (primary) hypertension: Secondary | ICD-10-CM | POA: Diagnosis not present

## 2013-01-30 NOTE — Assessment & Plan Note (Addendum)
Continue with current prescription therapy as reflected on the Med list. BP is ok at home 

## 2013-01-30 NOTE — Assessment & Plan Note (Signed)
Continue with current prescription therapy as reflected on the Med list.  

## 2013-01-30 NOTE — Assessment & Plan Note (Signed)
5/14 s/p cardioversion On Xarelto On Toprol XL

## 2013-01-30 NOTE — Progress Notes (Signed)
Subjective:    HPI  F/u SOB, throat is closing up, worse at night. Throat discomfort is better. Throat as checked by Dr Ezzard Standing F/u HTN - not better. He had A fib attack  D/c on 01/25/13:  pt was initially placed on IV diltiazem and heparin. Rate remained poorly controlled and it was felt that he would require TEE and cardioversion. Heparin was discontinued in favor of Xarelto and arrangements were made for TEE/DCCV to take placed after 3 doses of Xarelto. Pts rate remained difficult to control, elevating to the 180's with ambulation. He was maintained on oral beta blocker along with IV diltiazem. This morning, he underwent TEE revealing normal LV function and no evidence of left atrial appendage thrombus. Cardioversion was then successfully carried out using 1, 200 Joule shock. Post-cardioversion, pt has maintained sinus rhythm and has been ambulating without difficulty. Calcium channel blocker therapy has been discontinued and instead he has been placed on Toprol XL 50mg  BID. He will continue on Xarelto and be discharged this afternoon in good condition.     BP Readings from Last 3 Encounters:  01/30/13 150/88  01/26/13 123/73  01/26/13 123/73   Wt Readings from Last 3 Encounters:  01/30/13 209 lb (94.802 kg)  01/26/13 202 lb 6.4 oz (91.808 kg)  01/26/13 202 lb 6.4 oz (91.808 kg)      Review of Systems  Constitutional: Negative for appetite change, fatigue and unexpected weight change.  HENT: Negative for nosebleeds, congestion, sneezing, trouble swallowing, neck pain and sinus pressure.   Eyes: Negative for itching and visual disturbance.  Cardiovascular: Negative for palpitations and leg swelling.  Gastrointestinal: Negative for nausea, diarrhea, constipation, blood in stool and abdominal distention.  Genitourinary: Negative for dysuria, urgency, frequency, hematuria, decreased urine volume, scrotal swelling, enuresis and testicular pain.  Musculoskeletal: Negative for back  pain, joint swelling and gait problem.  Neurological: Negative for dizziness, tremors, facial asymmetry, speech difficulty, weakness and light-headedness.  Psychiatric/Behavioral: Negative for sleep disturbance, self-injury, dysphoric mood and agitation. The patient is not nervous/anxious.        Objective:   Physical Exam  Constitutional: He is oriented to person, place, and time. He appears well-developed and well-nourished.  HENT:  Mouth/Throat: Oropharynx is clear and moist.  thrush  Eyes: Conjunctivae are normal. Pupils are equal, round, and reactive to light.  Neck: Normal range of motion. No JVD present. No thyromegaly present.  Cardiovascular: Normal rate, regular rhythm and intact distal pulses.  Exam reveals no gallop and no friction rub.   Murmur (2/6) heard. Pulmonary/Chest: Effort normal and breath sounds normal. No respiratory distress. He has no wheezes. He has no rales. He exhibits no tenderness.  Abdominal: Soft. Bowel sounds are normal. He exhibits no distension and no mass. There is no tenderness. There is no rebound and no guarding.  Genitourinary: Rectum normal and prostate normal. Guaiac negative stool. No penile tenderness.  Musculoskeletal: Normal range of motion. He exhibits no edema and no tenderness.  Lymphadenopathy:    He has no cervical adenopathy.  Neurological: He is alert and oriented to person, place, and time. He has normal reflexes. No cranial nerve deficit. He exhibits normal muscle tone. Coordination normal.  Skin: Skin is warm and dry. No rash noted.  Psychiatric: He has a normal mood and affect. His behavior is normal. Judgment and thought content normal.   Lab Results  Component Value Date   WBC 7.8 01/26/2013   HGB 17.0 01/26/2013   HCT 49.4 01/26/2013  PLT 181 01/26/2013   GLUCOSE 115* 01/26/2013   CHOL 180 01/24/2013   TRIG 118 01/24/2013   HDL 38* 01/24/2013   LDLDIRECT 151.5 11/20/2007   LDLCALC 118* 01/24/2013   ALT 76* 01/24/2013   AST 31  01/24/2013   NA 141 01/26/2013   K 3.9 01/26/2013   CL 105 01/26/2013   CREATININE 1.00 01/26/2013   BUN 16 01/26/2013   CO2 29 01/26/2013   TSH 3.650 01/26/2013   PSA 1.94 09/26/2012   INR 1.11 01/24/2013   HGBA1C 5.5 01/24/2013           Assessment & Plan:

## 2013-02-08 ENCOUNTER — Telehealth: Payer: Self-pay | Admitting: Cardiology

## 2013-02-08 NOTE — Telephone Encounter (Signed)
Spoke with patient. Pt states starting 3-4 days ago he developed swelling in his feet and ankles and increase in SOB last night. Pt states BP in the last 3-4 days BP has been 150-178.

## 2013-02-08 NOTE — Telephone Encounter (Signed)
New problem    Has question regarding medication

## 2013-02-08 NOTE — Telephone Encounter (Signed)
Reviewed with Dr Shirlee Latch.  He recommended pt come for EKG for rule out atrial fibrillation. Pt given appt with Sunday Spillers 02/09/13 3:45PM. Pt aware.

## 2013-02-09 ENCOUNTER — Encounter: Payer: Self-pay | Admitting: Nurse Practitioner

## 2013-02-09 ENCOUNTER — Ambulatory Visit: Payer: BC Managed Care – PPO | Admitting: Nurse Practitioner

## 2013-02-09 ENCOUNTER — Ambulatory Visit (INDEPENDENT_AMBULATORY_CARE_PROVIDER_SITE_OTHER): Payer: BC Managed Care – PPO | Admitting: Nurse Practitioner

## 2013-02-09 VITALS — BP 150/80 | HR 60 | Ht 71.0 in | Wt 216.8 lb

## 2013-02-09 DIAGNOSIS — I10 Essential (primary) hypertension: Secondary | ICD-10-CM | POA: Diagnosis not present

## 2013-02-09 DIAGNOSIS — I4891 Unspecified atrial fibrillation: Secondary | ICD-10-CM | POA: Diagnosis not present

## 2013-02-09 LAB — BASIC METABOLIC PANEL
BUN: 14 mg/dL (ref 6–23)
CO2: 26 mEq/L (ref 19–32)
Calcium: 9.2 mg/dL (ref 8.4–10.5)
Chloride: 110 mEq/L (ref 96–112)
Creatinine, Ser: 0.9 mg/dL (ref 0.4–1.5)
GFR: 87.93 mL/min (ref >60.00)
Glucose, Bld: 102 mg/dL — ABNORMAL HIGH (ref 70–99)
Potassium: 3.7 mEq/L (ref 3.5–5.1)
Sodium: 141 mEq/L (ref 135–145)

## 2013-02-09 LAB — BRAIN NATRIURETIC PEPTIDE: Pro B Natriuretic peptide (BNP): 109 pg/mL — ABNORMAL HIGH (ref 0.0–100.0)

## 2013-02-09 MED ORDER — HYDROCHLOROTHIAZIDE 25 MG PO TABS: 25.0000 mg | ORAL_TABLET | Freq: Every day | ORAL | Status: DC

## 2013-02-09 NOTE — Patient Instructions (Signed)
Minimize your salt use  Elevate your legs as much as you can  I am adding HCTZ 25 mg a day for your blood pressure and for the swelling. Take this with all your other medicines  We will check labs today  Monitor your blood pressure at home  See Dr.McLean next week as planned  Call the [facility] office at [phone] if you have any questions, problems or concerns.

## 2013-02-09 NOTE — Progress Notes (Signed)
[name] [name] Date of Birth: 06/14/1941 Medical Record #[id number]  History of Present Illness: [name] [name] is seen back today for a follow up visit. Seen for [name] [name]. He has a history of HTN, HLD and aortic stenosis. Recently admitted towards the middle of May with atrial fib. Xarelto was started. He underwent TEE/DCCV during that admission with conversion back to sinus rhythm after no thrombus noted in the LAA. He has normal LV function. Does have a dilated aorta and a bicuspid aortic valve.   Called yesterday with a 3 to 4 day history of swelling in his feet and ankles and increased dyspnea. BP was running high at 150 -178.   Comes in today. Here alone. BP at home has been up. He feels some mild shortness of breath and pressure in his chest. No palpitations. More swelling in his feet. Says he does not use salt. Taking his medicines. No longer on his CCB - this was changed to his beta blocker.   Current Outpatient Prescriptions on File Prior to Visit  Medication Sig Dispense Refill  . Cholecalciferol 1000 UNITS capsule Take 1,000 Units by mouth daily.      [name] loratadine (CLARITIN) 10 MG tablet Take 1 tablet (10 mg total) by mouth daily.  100 tablet  3  . metoprolol succinate (TOPROL-XL) 50 MG 24 hr tablet Take 1 tablet (50 mg total) by mouth 2 (two) times daily.  60 tablet  3  . Rivaroxaban (XARELTO) 20 MG TABS Take 1 tablet (20 mg total) by mouth daily with breakfast.  30 tablet  6   No current facility-administered medications on file prior to visit.    Allergies  Allergen Reactions  . Benazepril     cough    Past Medical History  Diagnosis Date  . HTN (hypertension)   . A-fib     a. The Pt had new onset Afib in 07/2008. He underwent ibutilide cardioversion successfully. He has had 1-2 episodes/year that are short-lived that likely are  Afib w/RVR. CHADSVASC score 2;  b. 01/2013 TEE/DCCV->Xarelto initiated.  . Hypercholesteremia   . Aortic stenosis, mild     Echo 07/11: 55-60%,  mild LV hypertrophy, mild aortic stenosis w/mean gradient and peak gradient 36 mmHg.  . Osteoarthritis   . LBP (low back pain)   . Chest pain     ETT-myoview 12/11 w/exercise, no chest pain, no significant ST changes, EF 69%, no evidence for ischemia or infarction.    Past Surgical History  Procedure Laterality Date  . Back surgery  x12 years ago  . Lumbar laminectomy    . Hemorrhoid surgery    . Tee without cardioversion N/A 01/26/2013    Procedure: TRANSESOPHAGEAL ECHOCARDIOGRAM (TEE);  Surgeon: [name] Morale, MD;  Location: [facility] ENDOSCOPY;  Service: Cardiovascular;  Laterality: N/A;  . Cardioversion N/A 01/26/2013    Procedure: CARDIOVERSION;  Surgeon: [name] Morale, MD;  Location: [facility] ENDOSCOPY;  Service: Cardiovascular;  Laterality: N/A;    History  Smoking status  . Never Smoker   Smokeless tobacco  . Not on file    History  Alcohol Use  . 0.0 oz/week    Comment: Drinks 1 glass of wine nightly.    Family History  Problem Relation Age of Onset  . Hypertension Other   . Coronary artery disease Neg Hx   . Cancer Mother 80    colon    Review of Systems: The review of systems is per the HPI.  All other systems  were reviewed and are negative.  Physical Exam: BP 150/80  Pulse 60  Ht 5\' 11"  (1.803 m)  Wt 216 lb 12.8 oz (98.34 kg)  BMI 30.25 kg/m2 Patient is very pleasant and in no acute distress. Skin is warm and dry. Color is normal.  HEENT is unremarkable. Normocephalic/atraumatic. PERRL. Sclera are nonicteric. Neck is supple. No masses. No JVD. Lungs are clear. Cardiac exam shows a regular rate and rhythm. Outflow murmur noted. Abdomen is soft. Extremities are with 1+ pedal edema. Gait and ROM are intact. No gross neurologic deficits noted.  LABORATORY DATA: EKG today shows sinus rhythm. He is not in atrial fib.   Lab Results  Component Value Date   WBC 7.8 01/26/2013   HGB 17.0 01/26/2013   HCT 49.4 01/26/2013   PLT 181 01/26/2013   GLUCOSE 115* 01/26/2013    CHOL 180 01/24/2013   TRIG 118 01/24/2013   HDL 38* 01/24/2013   LDLDIRECT 151.5 11/20/2007   LDLCALC 118* 01/24/2013   ALT 76* 01/24/2013   AST 31 01/24/2013   NA 141 01/26/2013   K 3.9 01/26/2013   CL 105 01/26/2013   CREATININE 1.00 01/26/2013   BUN 16 01/26/2013   CO2 29 01/26/2013   TSH 3.650 01/26/2013   PSA 1.94 09/26/2012   INR 1.11 01/24/2013   HGBA1C 5.5 01/24/2013   Echo Study Conclusions  - Left ventricle: The cavity size was normal. There was mild concentric hypertrophy. The estimated ejection fraction was 55%. Wall motion was normal; there were no regional wall motion abnormalities. - Aortic valve: Bicuspid aortic valve with fusion of left and right coronary cusps. Moderately calcified leaflets. There was mild stenosis. Mild regurgitation. Mean gradient: 13mm Hg (S). - Aorta: Ascending aorta dilated to 4.5 cm. - Mitral valve: Trivial regurgitation. - Left atrium: The atrium was mildly dilated. No evidence of thrombus in the atrial cavity or appendage. No evidence of thrombus in the atrial cavity or appendage. - Right ventricle: The cavity size was normal. Systolic function was normal. - Right atrium: No evidence of thrombus in the atrial cavity or appendage. - Atrial septum: No defect or patent foramen ovale was identified. Echo contrast study showed no right-to-left atrial level shunt, at baseline or with provocation.   Assessment / Plan: 1. PAF - remains in sinus rhythm. On Xarelto.   2. HTN - BP originally this morning up to 180/100 - repeat by me is 150/80. I have added HCTZ 25 mg a day. Check BMET today. He will continue to monitor at home.   3. Dyspnea/edema - probably from his hypertension. May need stress testing if symptoms persist despite control of his BP.   4. Dilated aorta noted by echo - will need follow up as determined by [name] [name].   5. Bicuspid aortic valve - with mild AS - will need follow up.   He will keep his follow up visit for next week. He  is to continue to minimize his activities until we get his BP under control.   Patient is agreeable to this plan and will call if any problems develop in the interim.   [name] Macadamia, RN, ANP-C [facility] [address] [place], [place]  [address]

## 2013-02-10 ENCOUNTER — Encounter: Payer: Self-pay | Admitting: Internal Medicine

## 2013-02-12 ENCOUNTER — Telehealth: Payer: Self-pay | Admitting: Nurse Practitioner

## 2013-02-12 NOTE — Telephone Encounter (Signed)
New Problem:    Patient called in returning your call. Please call back. 

## 2013-02-13 NOTE — Telephone Encounter (Signed)
Follow up  ° ° ° ° °Pt is returning your call  °

## 2013-02-14 ENCOUNTER — Encounter: Payer: Self-pay | Admitting: Cardiology

## 2013-02-14 ENCOUNTER — Ambulatory Visit (INDEPENDENT_AMBULATORY_CARE_PROVIDER_SITE_OTHER): Payer: BC Managed Care – PPO | Admitting: Cardiology

## 2013-02-14 VITALS — BP 146/80 | HR 56 | Ht 71.0 in | Wt 210.0 lb

## 2013-02-14 DIAGNOSIS — I359 Nonrheumatic aortic valve disorder, unspecified: Secondary | ICD-10-CM | POA: Diagnosis not present

## 2013-02-14 DIAGNOSIS — I10 Essential (primary) hypertension: Secondary | ICD-10-CM | POA: Diagnosis not present

## 2013-02-14 DIAGNOSIS — I4891 Unspecified atrial fibrillation: Secondary | ICD-10-CM

## 2013-02-14 DIAGNOSIS — I719 Aortic aneurysm of unspecified site, without rupture: Secondary | ICD-10-CM

## 2013-02-14 DIAGNOSIS — I712 Thoracic aortic aneurysm, without rupture: Secondary | ICD-10-CM

## 2013-02-14 MED ORDER — LOSARTAN POTASSIUM 50 MG PO TABS: 50.0000 mg | ORAL_TABLET | Freq: Every day | ORAL | Status: DC

## 2013-02-14 NOTE — Progress Notes (Signed)
Patient ID: [name] [name], male   DOB: 06/16/1941, 72 y.o.   MRN: [id number] PCP: Dr. Posey Rea  72 yo with history of HTN and paroxysmal atrial fibrillation returns for followup. He was hospitalized with atrial fibrillation/RVR in 5/14.  He had TEE-guided cardioversion and is now back in NSR.  TEE showed bicuspid aortic valve with mild AS and a moderately dilated ascending aorta.  He is now on Xarelto.  Has last episode of symptomatic atrial fibrillation prior to 5/14 was over 2 years ago.   Since last appointment with Tereso Newcomer, he has been doing well.  He had some lower extremity edema but this has resolved with HCTZ.  No tachypalpitations.  He is walking 3 miles for exercise without exertional dyspnea or chest pain.    Labs (5/14): K 3.7, creatinine 0.9, BNP 2261=>109, TSH normal  Allergies (verified):  No Known Drug Allergies   Past Medical History:  1. Hypertension: ACEI cough.  2. Atrial fibrillation. The patient had new-onset atrial fibrillation in November 2009. He underwent ibutilide cardioversion successfully. He has had 1-2 episodes/year that are short-lived that likely are atrial fibrillation with RVR. He was admitted in 5/14 with atrial fibrillation/RVR and had TEE-guided DCCV.  CHADSVASC score 2.  3. Hypercholesterolemia.  4. Bicuspid aortic valve disorder: Echo (7/11): EF 55-60%, mild LV hypertrophy, mild aortic stenosis with mean gradient 19 mmHg and peak gradient 36 mmHg.  TEE (5/14): EF 55%, mild LVH, bicuspid aortic valve with mild AS (mean gradient 13 mmHg), ascending aorta 4.5 cm.   5. Osteoarthritis.  6. Low back pain.  7. Chest pain: ETT-myoview (12/11) with 10:51 exercise, no chest pain, no significant ST changes, EF 69%, no evidence for ischemia or infarction.  8. Ascending aortic aneurysm: Associated with bicuspid aortic valve.  4.5 cm by TEE in 5/14.   Family History:  Hypertension. No early CAD.   Social History:  The patient lives in [place]. He is a native  of [place]. He is an Art gallery manager at [employer]. He drinks a glass of wine nightly. He is a nonsmoker. He is a former Geophysicist/field seismologist.   Review of Systems  All systems reviewed and negative except as per HPI.   Current Outpatient Prescriptions  Medication Sig Dispense Refill  . Cholecalciferol 1000 UNITS capsule Take 1,000 Units by mouth daily.      . hydrochlorothiazide (HYDRODIURIL) 25 MG tablet Take 1 tablet (25 mg total) by mouth daily.  90 tablet  3  . loratadine (CLARITIN) 10 MG tablet Take 1 tablet (10 mg total) by mouth daily.  100 tablet  3  . metoprolol succinate (TOPROL-XL) 50 MG 24 hr tablet Take 1 tablet (50 mg total) by mouth 2 (two) times daily.  60 tablet  3  . Rivaroxaban (XARELTO) 20 MG TABS Take 1 tablet (20 mg total) by mouth daily with breakfast.  30 tablet  6  . losartan (COZAAR) 50 MG tablet Take 1 tablet (50 mg total) by mouth daily.  30 tablet  3   No current facility-administered medications for this visit.    BP 146/80  Pulse 56  Ht 5\' 11"  (1.803 m)  Wt 210 lb (95.255 kg)  BMI 29.3 kg/m2  SpO2 96% General: NAD Neck: No JVD, no thyromegaly or thyroid nodule.  Lungs: Clear to auscultation bilaterally with normal respiratory effort. CV: Nondisplaced PMI.  Heart regular S1/S2, no S3/S4, 2/6 SEM RUSB.  No peripheral edema.  No carotid bruit.  Normal pedal pulses.  Abdomen: Soft, nontender,  no hepatosplenomegaly, no distention.  Neurologic: Alert and oriented x 3.  Psych: Normal affect. Extremities: No clubbing or cyanosis.   Assessment/Plan: 1. Atrial fibrillation: Paroxysmal.  He has had a symptomatic episode 1-2 times a year.  He has only come to the hospital twice now, once in 2009 and again in 5/14.  He is back in NSR after TEE-guided DCCV.  CHADSVASC = 2.  - I recommended that he continue Xarelto long-term.  He is inclined to stop it at the end of a month post-DCCV.  I discussed stroke risk with him.  - Continue Toprol XL.   - Hold off on  anti-arrhythmic for now.  If atrial fibrillation episodes begin to come more frequently, he would be a candidate for dronedarone or a type Ic agent.   2. HTN: BP continues to run high.  Add losartan 50 mg daily with BMET at followup.  3. Bicuspid aortic valve disorder: Only mild AS but there is a moderate ascending aortic aneurysm.  I will get an MRA of his chest to fully assess the thoracic aorta.    Marca Ancona 02/15/2013

## 2013-02-14 NOTE — Patient Instructions (Addendum)
Schedule an appointment for an MRA of your chest.   Start losartan 50mg  daily.   Your physician recommends that you schedule a follow-up appointment in: 6 weeks with Dr Shirlee Latch.   Your physician recommends that you return for lab work in: 6 weeks when you see Dr Frutoso Chase.

## 2013-02-15 DIAGNOSIS — I712 Thoracic aortic aneurysm, without rupture: Secondary | ICD-10-CM | POA: Insufficient documentation

## 2013-02-19 ENCOUNTER — Ambulatory Visit (HOSPITAL_COMMUNITY)
Admission: RE | Admit: 2013-02-19 | Discharge: 2013-02-19 | Disposition: A | Discharge status: Home / Self Care | Payer: BC Managed Care – PPO | Source: Ambulatory Visit | Attending: Cardiology | Admitting: Cardiology

## 2013-02-19 DIAGNOSIS — I712 Thoracic aortic aneurysm, without rupture, unspecified: Secondary | ICD-10-CM | POA: Insufficient documentation

## 2013-02-19 DIAGNOSIS — Q231 Congenital insufficiency of aortic valve: Secondary | ICD-10-CM | POA: Insufficient documentation

## 2013-02-19 DIAGNOSIS — I359 Nonrheumatic aortic valve disorder, unspecified: Secondary | ICD-10-CM | POA: Insufficient documentation

## 2013-02-19 MED ORDER — GADOBENATE DIMEGLUMINE 529 MG/ML IV SOLN
20.0000 mL | Freq: Once | INTRAVENOUS | Status: AC
  Administered 2013-02-19: 20 mL via INTRAVENOUS

## 2013-03-05 ENCOUNTER — Encounter: Payer: BC Managed Care – PPO | Admitting: Cardiology

## 2013-03-15 ENCOUNTER — Other Ambulatory Visit: Payer: Self-pay | Admitting: *Deleted

## 2013-03-15 ENCOUNTER — Ambulatory Visit (INDEPENDENT_AMBULATORY_CARE_PROVIDER_SITE_OTHER): Payer: BC Managed Care – PPO | Admitting: Cardiology

## 2013-03-15 ENCOUNTER — Other Ambulatory Visit: Payer: BC Managed Care – PPO

## 2013-03-15 ENCOUNTER — Encounter: Payer: Self-pay | Admitting: Cardiology

## 2013-03-15 VITALS — BP 138/82 | HR 55 | Ht 71.0 in | Wt 207.0 lb

## 2013-03-15 DIAGNOSIS — I10 Essential (primary) hypertension: Secondary | ICD-10-CM

## 2013-03-15 DIAGNOSIS — G8929 Other chronic pain: Secondary | ICD-10-CM

## 2013-03-15 DIAGNOSIS — I4891 Unspecified atrial fibrillation: Secondary | ICD-10-CM

## 2013-03-15 DIAGNOSIS — M2550 Pain in unspecified joint: Secondary | ICD-10-CM

## 2013-03-15 DIAGNOSIS — I712 Thoracic aortic aneurysm, without rupture: Secondary | ICD-10-CM

## 2013-03-15 DIAGNOSIS — E875 Hyperkalemia: Secondary | ICD-10-CM

## 2013-03-15 LAB — SEDIMENTATION RATE: Sed Rate: 5 mm/hr (ref 0–22)

## 2013-03-15 LAB — CBC WITH DIFFERENTIAL/PLATELET
Basophils Absolute: 0 10*3/uL (ref 0.0–0.1)
Basophils Relative: 0.3 % (ref 0.0–3.0)
Eosinophils Absolute: 0.2 10*3/uL (ref 0.0–0.7)
Eosinophils Relative: 3 % (ref 0.0–5.0)
HCT: 45.6 % (ref 39.0–52.0)
Hemoglobin: 15.3 g/dL (ref 13.0–17.0)
Lymphocytes Relative: 29.8 % (ref 12.0–46.0)
Lymphs Abs: 1.7 10*3/uL (ref 0.7–4.0)
MCHC: 33.6 g/dL (ref 30.0–36.0)
MCV: 92.8 fl (ref 78.0–100.0)
Monocytes Absolute: 0.4 10*3/uL (ref 0.1–1.0)
Monocytes Relative: 7.6 % (ref 3.0–12.0)
Neutro Abs: 3.4 10*3/uL (ref 1.4–7.7)
Neutrophils Relative %: 59.3 % (ref 43.0–77.0)
Platelets: 177 10*3/uL (ref 150.0–400.0)
RBC: 4.91 Mil/uL (ref 4.22–5.81)
RDW: 13.4 % (ref 11.5–14.6)
WBC: 5.7 10*3/uL (ref 4.5–10.5)

## 2013-03-15 LAB — BASIC METABOLIC PANEL
BUN: 17 mg/dL (ref 6–23)
CO2: 27 mEq/L (ref 19–32)
Calcium: 9.5 mg/dL (ref 8.4–10.5)
Chloride: 104 mEq/L (ref 96–112)
Creatinine, Ser: 1.1 mg/dL (ref 0.4–1.5)
GFR: 66.92 mL/min (ref >60.00)
Glucose, Bld: 103 mg/dL — ABNORMAL HIGH (ref 70–99)
Potassium: 5.6 mEq/L — ABNORMAL HIGH (ref 3.5–5.1)
Sodium: 140 mEq/L (ref 135–145)

## 2013-03-15 LAB — C-REACTIVE PROTEIN: CRP: <0.5 mg/dL (ref 0.5–20.0)

## 2013-03-15 MED ORDER — APIXABAN 5 MG PO TABS: 5.0000 mg | ORAL_TABLET | Freq: Two times a day (BID) | ORAL | Status: DC

## 2013-03-15 NOTE — Patient Instructions (Signed)
Stop Xarelto.   I will call you in 2 weeks to see if your joint pain is better.   After you have been off Xarelto for 2 weeks start Eliquis 5mg  two times a day.   Your physician recommends that you have  lab work today--CBCd/BMET/Sed rate/ CRP.  Your physician wants you to follow-up in: 3 months with Dr Shirlee Latch. (October 2014). You will receive a reminder letter in the mail two months in advance. If you don't receive a letter, please call our office to schedule the follow-up appointment.

## 2013-03-16 NOTE — Progress Notes (Signed)
Patient ID: [name] [name], male   DOB: 12-Dec-1940, 72 y.o.   MRN: [id number] PCP: Dr. Posey Rea  72 yo with history of HTN and paroxysmal atrial fibrillation returns for followup. He was hospitalized with atrial fibrillation/RVR in 5/14.  He had TEE-guided cardioversion and is now back in NSR.  TEE showed bicuspid aortic valve with mild AS and a moderately dilated ascending aorta.  MRA chest showed that the ascending aortic aneurysm measured 4.3 cm in greatest diameter.  He is now on Xarelto.  His last episode of symptomatic atrial fibrillation prior to 5/14 was over 2 years ago.   Patient's main complaint today is joint pain in his knees, ankles, and feet.  He says this started when he started on Xarelto.  He is convinced that Xarelto is the culprit and wants to stop it.  Otherwise, he has been doing well with no tachypalpitations, dyspnea, or chest pain.    Labs (5/14): K 3.7, creatinine 0.9, BNP 2261=>109, TSH normal  ECG: NSR, normal  Allergies (verified):  No Known Drug Allergies   Past Medical History:  1. Hypertension: ACEI cough.  2. Atrial fibrillation. The patient had new-onset atrial fibrillation in November 2009. He underwent ibutilide cardioversion successfully. He has had 1-2 episodes/year that are short-lived that likely are atrial fibrillation with RVR. He was admitted in 5/14 with atrial fibrillation/RVR and had TEE-guided DCCV.  CHADSVASC score 2.  3. Hypercholesterolemia.  4. Bicuspid aortic valve disorder: Echo (7/11): EF 55-60%, mild LV hypertrophy, mild aortic stenosis with mean gradient 19 mmHg and peak gradient 36 mmHg.  TEE (5/14): EF 55%, mild LVH, bicuspid aortic valve with mild AS (mean gradient 13 mmHg), ascending aorta 4.5 cm.   5. Osteoarthritis.  6. Low back pain.  7. Chest pain: ETT-myoview (12/11) with 10:51 exercise, no chest pain, no significant ST changes, EF 69%, no evidence for ischemia or infarction.  8. Ascending aortic aneurysm: Associated with bicuspid  aortic valve.  4.5 cm by TEE in 5/14.   MRA chest (6/14) with bicuspid aortic valve, 4.3 cm ascending aortic aneurysm.   Family History:  Hypertension. No early CAD.   Social History:  The patient lives in [place]. He is a native of [place]. He is an Art gallery manager at [employer]. He drinks a glass of wine nightly. He is a nonsmoker. He is a former Geophysicist/field seismologist.   Review of Systems  All systems reviewed and negative except as per HPI.   Current Outpatient Prescriptions  Medication Sig Dispense Refill  . Cholecalciferol 1000 UNITS capsule Take 1,000 Units by mouth daily.      . hydrochlorothiazide (HYDRODIURIL) 25 MG tablet Take 1 tablet (25 mg total) by mouth daily.  90 tablet  3  . metoprolol succinate (TOPROL-XL) 50 MG 24 hr tablet Take 1 tablet (50 mg total) by mouth 2 (two) times daily.  60 tablet  3  . apixaban (ELIQUIS) 5 MG TABS tablet Take 1 tablet (5 mg total) by mouth 2 (two) times daily.  60 tablet  6  . loratadine (CLARITIN) 10 MG tablet Take 1 tablet (10 mg total) by mouth daily.  100 tablet  3   No current facility-administered medications for this visit.    BP 138/82  Pulse 55  Ht 5\' 11"  (1.803 m)  Wt 207 lb (93.895 kg)  BMI 28.88 kg/m2  SpO2 97% General: NAD Neck: No JVD, no thyromegaly or thyroid nodule.  Lungs: Clear to auscultation bilaterally with normal respiratory effort. CV: Nondisplaced PMI.  Heart regular S1/S2, no S3/S4, 2/6 SEM RUSB.  No peripheral edema.  No carotid bruit.  Normal pedal pulses.  Abdomen: Soft, nontender, no hepatosplenomegaly, no distention.  Neurologic: Alert and oriented x 3.  Psych: Normal affect. Extremities: No clubbing or cyanosis.   Assessment/Plan: 1. Atrial fibrillation: Paroxysmal.  He has had a symptomatic episode 1-2 times a year.  He has only come to the hospital twice now, once in 2009 and again in 5/14.  He is back in NSR after TEE-guided DCCV.  CHADSVASC = 2.  - He is convinced that Xarelto is causing joint  pains and wants to stop it.  He is willing to try Eliquis after a couple of weeks off Xarelto to make sure that the pain goes away.  Joint pain is a very rare side effect of Xarelto and I warned him that being off anticoagulation will increase his stroke risk.  I will restart him on Eliquis 5 mg bid after a couple of weeks.  I will check ESR and CRP (in case this is an inflammatory arthritis).  - Continue Toprol XL.   - Hold off on anti-arrhythmic for now.  If atrial fibrillation episodes begin to come more frequently, he would be a candidate for dronedarone or a type Ic agent.   2. HTN: BP stable.  He has not been taking losartan.   3. Bicuspid aortic valve disorder: Only mild AS.  MRA chest showed 4.3 cm ascending aortic aneurysm.  Repeat MRA chest in 6/15.   Marca Ancona 03/16/2013

## 2013-03-19 ENCOUNTER — Other Ambulatory Visit (INDEPENDENT_AMBULATORY_CARE_PROVIDER_SITE_OTHER): Payer: BC Managed Care – PPO

## 2013-03-19 DIAGNOSIS — E875 Hyperkalemia: Secondary | ICD-10-CM

## 2013-03-19 LAB — BASIC METABOLIC PANEL
BUN: 17 mg/dL (ref 6–23)
CO2: 28 mEq/L (ref 19–32)
Calcium: 9.4 mg/dL (ref 8.4–10.5)
Chloride: 104 mEq/L (ref 96–112)
Creatinine, Ser: 1 mg/dL (ref 0.4–1.5)
GFR: 75.23 mL/min (ref >60.00)
Glucose, Bld: 100 mg/dL — ABNORMAL HIGH (ref 70–99)
Potassium: 3.5 mEq/L (ref 3.5–5.1)
Sodium: 140 mEq/L (ref 135–145)

## 2013-03-20 NOTE — Addendum Note (Signed)
Addended by: Micki Riley C on: 03/20/2013 12:51 PM   Modules accepted: Orders

## 2013-03-28 ENCOUNTER — Telehealth: Payer: Self-pay | Admitting: *Deleted

## 2013-03-28 NOTE — Telephone Encounter (Signed)
Spoke with patient. Pt states that his joint pain completely resolved and his energy level increased after stopping Xarelto. He did not start Eliquis and states he feels so much better he does not want to start Eliquis. I will forward to Dr Shirlee Latch

## 2013-03-28 NOTE — Telephone Encounter (Signed)
He is convinced that Xarelto is causing joint pains and wants to stop it. He is willing to try Eliquis after a couple of weeks off Xarelto to make sure that the pain goes away. Joint pain is a very rare side effect of Xarelto and I warned him that being off anticoagulation will increase his stroke risk. I will restart him on Eliquis 5 mg bid after a couple of weeks. I will check ESR and CRP (in case this is an inflammatory arthritis).   03/28/13 LMTCB to see if joint pain has improved off Xarelto.

## 2013-04-18 ENCOUNTER — Other Ambulatory Visit: Payer: Self-pay

## 2013-06-12 DIAGNOSIS — Z23 Encounter for immunization: Secondary | ICD-10-CM | POA: Diagnosis not present

## 2013-06-29 ENCOUNTER — Other Ambulatory Visit: Payer: Self-pay | Admitting: Nurse Practitioner

## 2013-07-09 DIAGNOSIS — I789 Disease of capillaries, unspecified: Secondary | ICD-10-CM | POA: Diagnosis not present

## 2013-07-09 DIAGNOSIS — L570 Actinic keratosis: Secondary | ICD-10-CM | POA: Diagnosis not present

## 2013-07-09 DIAGNOSIS — L821 Other seborrheic keratosis: Secondary | ICD-10-CM | POA: Diagnosis not present

## 2013-07-17 ENCOUNTER — Encounter: Payer: Self-pay | Admitting: Cardiology

## 2013-07-17 ENCOUNTER — Ambulatory Visit (INDEPENDENT_AMBULATORY_CARE_PROVIDER_SITE_OTHER): Payer: BC Managed Care – PPO | Admitting: Cardiology

## 2013-07-17 VITALS — BP 154/70 | HR 49 | Ht 71.0 in | Wt 213.0 lb

## 2013-07-17 DIAGNOSIS — I359 Nonrheumatic aortic valve disorder, unspecified: Secondary | ICD-10-CM | POA: Diagnosis not present

## 2013-07-17 DIAGNOSIS — I719 Aortic aneurysm of unspecified site, without rupture: Secondary | ICD-10-CM | POA: Diagnosis not present

## 2013-07-17 DIAGNOSIS — I10 Essential (primary) hypertension: Secondary | ICD-10-CM

## 2013-07-17 DIAGNOSIS — I4891 Unspecified atrial fibrillation: Secondary | ICD-10-CM

## 2013-07-17 MED ORDER — METOPROLOL SUCCINATE ER 50 MG PO TB24: ORAL_TABLET | ORAL | Status: DC

## 2013-07-17 NOTE — Patient Instructions (Signed)
Take aspirin 81mg  daily.   Your physician wants you to follow-up in: 6 months with Dr Shirlee Latch. (May 2015). You will receive a reminder letter in the mail two months in advance. If you don't receive a letter, please call our office to schedule the follow-up appointment.

## 2013-07-18 NOTE — Progress Notes (Signed)
Patient ID: [name] [name], male   DOB: 1941/04/06, 72 y.o.   MRN: [id number] PCP: Dr. Posey Rea  72 y.o. with history of HTN and paroxysmal atrial fibrillation returns for followup. He was hospitalized with atrial fibrillation/RVR in 5/14.  He had TEE-guided cardioversion and is now back in NSR.  TEE showed bicuspid aortic valve with mild AS and a moderately dilated ascending aorta.  MRA chest showed that the ascending aortic aneurysm measured 4.3 cm in greatest diameter.  His last episode of symptomatic atrial fibrillation prior to 5/14 was over 2 years ago.   He was on Xarelto for anticoagulation but stopped it as he was convinced it was causing joint pains.  He has refused to restart any form of anticoagulation.  His blood pressure has been well-controlled at home recently (SBP 120s-130s) though it is elevated in the office today.  His HR runs in the 50s at rest typically.  He denies fatigue or lightheadedness.  No runs of tachypalpitations.  He is in NSR today.  No exertional dyspnea or chest pain.  He recently went to [place] to visit family.    Labs (5/14): K 3.7, creatinine 0.9, BNP 2261=>109, TSH normal Labs (7/14): K 3.5, creatinine 1.0  ECG: NSR, nonspecific T wave flattening.   Allergies (verified):  No Known Drug Allergies   Past Medical History:  1. Hypertension: ACEI cough.  2. Atrial fibrillation. The patient had new-onset atrial fibrillation in November 2009. He underwent ibutilide cardioversion successfully. He has had 1-2 episodes/year that are short-lived that likely are atrial fibrillation with RVR. He was admitted in 5/14 with atrial fibrillation/RVR and had TEE-guided DCCV.  CHADSVASC score 2.  3. Hypercholesterolemia.  4. Bicuspid aortic valve disorder: Echo (7/11): EF 55-60%, mild LV hypertrophy, mild aortic stenosis with mean gradient 19 mmHg and peak gradient 36 mmHg.  TEE (5/14): EF 55%, mild LVH, bicuspid aortic valve with mild AS (mean gradient 13 mmHg), ascending aorta  4.5 cm.   5. Osteoarthritis.  6. Low back pain.  7. Chest pain: ETT-myoview (12/11) with 10:51 exercise, no chest pain, no significant ST changes, EF 69%, no evidence for ischemia or infarction.  8. Ascending aortic aneurysm: Associated with bicuspid aortic valve.  4.5 cm by TEE in 5/14.   MRA chest (6/14) with bicuspid aortic valve, 4.3 cm ascending aortic aneurysm.   Family History:  Hypertension. No early CAD.   Social History:  The patient lives in [place]. He is a native of [place]. He is retired Art gallery manager at [employer]. He drinks a glass of wine nightly. He is a nonsmoker. He is a former Geophysicist/field seismologist.   Review of Systems  All systems reviewed and negative except as per HPI.   Current Outpatient Prescriptions  Medication Sig Dispense Refill  . aspirin EC 81 MG tablet Take 1 tablet (81 mg total) by mouth daily.      . Cholecalciferol 1000 UNITS capsule Take 1,000 Units by mouth daily.      . hydrochlorothiazide (HYDRODIURIL) 25 MG tablet Take 1 tablet (25 mg total) by mouth daily.  90 tablet  3  . loratadine (CLARITIN) 10 MG tablet Take 1 tablet (10 mg total) by mouth daily.  100 tablet  3  . metoprolol succinate (TOPROL-XL) 50 MG 24 hr tablet TAKE 1 TABLET BY MOUTH TWICE A DAY  180 tablet  3   No current facility-administered medications for this visit.    BP 154/70  Pulse 49  Ht 5\' 11"  (1.803 m)  Wt  213 lb (96.616 kg)  BMI 29.72 kg/m2 General: NAD Neck: No JVD, no thyromegaly or thyroid nodule.  Lungs: Clear to auscultation bilaterally with normal respiratory effort. CV: Nondisplaced PMI.  Heart regular S1/S2, no S3/S4, 3/6 SEM RUSB with clear S2.  No peripheral edema.  No carotid bruit.  Normal pedal pulses.  Abdomen: Soft, nontender, no hepatosplenomegaly, no distention.  Neurologic: Alert and oriented x 3.  Psych: Normal affect. Extremities: No clubbing or cyanosis.   Assessment/Plan: 1. Atrial fibrillation: Paroxysmal.  He has had a symptomatic  episode 1-2 times a year.  He has only come to the hospital twice now, once in 2009 and again in 5/14.  He is back in NSR after TEE-guided DCCV.  CHADSVASC = 2.  He remains in NSR today.  - He refuses to take another anticoagulant (thought Xarelto caused joint pains).  I had a long discussion with him today about stroke risk and he understands.  He will take ASA 81 mg daily. - Continue Toprol XL.   - Hold off on anti-arrhythmic for now.  If atrial fibrillation episodes begin to come more frequently, he would be a candidate for dronedarone or a type Ic agent.   2. HTN: BP stable on Toprol XL and HCTZ.   3. Bicuspid aortic valve disorder: Only mild AS.  MRA chest showed 4.3 cm ascending aortic aneurysm.  Repeat MRA chest in 6/15.   Marca Ancona 07/18/2013

## 2013-07-19 ENCOUNTER — Other Ambulatory Visit: Payer: Self-pay

## 2013-07-20 ENCOUNTER — Ambulatory Visit: Payer: BC Managed Care – PPO | Admitting: Cardiology

## 2013-07-27 ENCOUNTER — Other Ambulatory Visit: Payer: Self-pay | Admitting: Cardiology

## 2013-08-23 ENCOUNTER — Telehealth: Payer: Self-pay | Admitting: Cardiology

## 2013-08-23 MED ORDER — METOPROLOL SUCCINATE ER 25 MG PO TB24: 25.0000 mg | ORAL_TABLET | Freq: Two times a day (BID) | ORAL | Status: DC

## 2013-08-23 MED ORDER — HYDROCHLOROTHIAZIDE 25 MG PO TABS: ORAL_TABLET | ORAL | Status: DC

## 2013-08-23 NOTE — Telephone Encounter (Signed)
Pt states he has been taking Toprol XL 25mg  two times a day instead of Toprol XL 50mg  two times a day and HCTZ 12.5mg  daily instead of HCTZ 25mg  daily since OV with Dr Shirlee Latch 07/17/13. He has been tolerating these doses fine and would like new prescriptions for the lower doses. I will forward to Dr Shirlee Latch for review.

## 2013-08-23 NOTE — Telephone Encounter (Signed)
That would be fine 

## 2013-08-23 NOTE — Telephone Encounter (Signed)
New message  Patient is calling your regarding changes in medications. Please call and advise.

## 2013-08-23 NOTE — Telephone Encounter (Signed)
Pt advised.

## 2013-08-24 ENCOUNTER — Ambulatory Visit (INDEPENDENT_AMBULATORY_CARE_PROVIDER_SITE_OTHER): Payer: Medicare Other | Admitting: Internal Medicine

## 2013-08-24 ENCOUNTER — Other Ambulatory Visit (INDEPENDENT_AMBULATORY_CARE_PROVIDER_SITE_OTHER): Payer: Medicare Other

## 2013-08-24 ENCOUNTER — Encounter: Payer: Self-pay | Admitting: Internal Medicine

## 2013-08-24 VITALS — BP 160/90 | HR 80 | Resp 16 | Ht 71.0 in | Wt 209.0 lb

## 2013-08-24 DIAGNOSIS — R7309 Other abnormal glucose: Secondary | ICD-10-CM

## 2013-08-24 DIAGNOSIS — I4891 Unspecified atrial fibrillation: Secondary | ICD-10-CM

## 2013-08-24 DIAGNOSIS — I10 Essential (primary) hypertension: Secondary | ICD-10-CM

## 2013-08-24 DIAGNOSIS — R059 Cough, unspecified: Secondary | ICD-10-CM

## 2013-08-24 DIAGNOSIS — Z0000 Encounter for general adult medical examination without abnormal findings: Secondary | ICD-10-CM

## 2013-08-24 DIAGNOSIS — N320 Bladder-neck obstruction: Secondary | ICD-10-CM

## 2013-08-24 DIAGNOSIS — R05 Cough: Secondary | ICD-10-CM

## 2013-08-24 DIAGNOSIS — I359 Nonrheumatic aortic valve disorder, unspecified: Secondary | ICD-10-CM | POA: Diagnosis not present

## 2013-08-24 LAB — CBC WITH DIFFERENTIAL/PLATELET
Basophils Absolute: 0 10*3/uL (ref 0.0–0.1)
Basophils Relative: 0.3 % (ref 0.0–3.0)
Eosinophils Absolute: 0.2 10*3/uL (ref 0.0–0.7)
Eosinophils Relative: 3.4 % (ref 0.0–5.0)
HCT: 41 % (ref 39.0–52.0)
Hemoglobin: 14.1 g/dL (ref 13.0–17.0)
Lymphocytes Relative: 23.2 % (ref 12.0–46.0)
Lymphs Abs: 1.6 10*3/uL (ref 0.7–4.0)
MCHC: 34.3 g/dL (ref 30.0–36.0)
MCV: 89.4 fl (ref 78.0–100.0)
Monocytes Absolute: 0.6 10*3/uL (ref 0.1–1.0)
Monocytes Relative: 8.3 % (ref 3.0–12.0)
Neutro Abs: 4.5 10*3/uL (ref 1.4–7.7)
Neutrophils Relative %: 64.8 % (ref 43.0–77.0)
Platelets: 173 10*3/uL (ref 150.0–400.0)
RBC: 4.59 Mil/uL (ref 4.22–5.81)
RDW: 13.7 % (ref 11.5–14.6)
WBC: 7 10*3/uL (ref 4.5–10.5)

## 2013-08-24 LAB — BASIC METABOLIC PANEL
BUN: 20 mg/dL (ref 6–23)
CO2: 30 mEq/L (ref 19–32)
Calcium: 9.5 mg/dL (ref 8.4–10.5)
Chloride: 103 mEq/L (ref 96–112)
Creatinine, Ser: 1 mg/dL (ref 0.4–1.5)
GFR: 78.65 mL/min (ref >60.00)
Glucose, Bld: 86 mg/dL (ref 70–99)
Potassium: 3.8 mEq/L (ref 3.5–5.1)
Sodium: 139 mEq/L (ref 135–145)

## 2013-08-24 LAB — LIPID PANEL
Cholesterol: 175 mg/dL (ref 0–200)
HDL: 38.4 mg/dL — ABNORMAL LOW (ref >39.00)
LDL Cholesterol: 107 mg/dL — ABNORMAL HIGH (ref 0–99)
Total CHOL/HDL Ratio: 5
Triglycerides: 149 mg/dL (ref 0.0–149.0)
VLDL: 29.8 mg/dL (ref 0.0–40.0)

## 2013-08-24 LAB — URINALYSIS
Bilirubin Urine: NEGATIVE
Hgb urine dipstick: NEGATIVE
Ketones, ur: NEGATIVE
Leukocytes, UA: NEGATIVE
Nitrite: NEGATIVE
Specific Gravity, Urine: 1.025 (ref 1.000–1.030)
Total Protein, Urine: NEGATIVE
Urine Glucose: NEGATIVE
Urobilinogen, UA: 0.2 (ref 0.0–1.0)
pH: 6 (ref 5.0–8.0)

## 2013-08-24 LAB — HEMOGLOBIN A1C: Hgb A1c MFr Bld: 5.7 % (ref 4.6–6.5)

## 2013-08-24 LAB — PSA: PSA: 2.71 ng/mL (ref 0.10–4.00)

## 2013-08-24 LAB — TSH: TSH: 3.65 u[IU]/mL (ref 0.35–5.50)

## 2013-08-24 MED ORDER — DAPSONE 5 % EX GEL: CUTANEOUS | Status: DC

## 2013-08-24 NOTE — Assessment & Plan Note (Addendum)
  12/14 pt refused anticoagulation; agrees to take a baby ASA only

## 2013-08-24 NOTE — Assessment & Plan Note (Signed)
A1c
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
## 2013-08-24 NOTE — Assessment & Plan Note (Signed)

## 2013-08-24 NOTE — Assessment & Plan Note (Signed)
BP is better at home 

## 2013-08-24 NOTE — Progress Notes (Signed)
Pre visit review using our clinic review tool, if applicable. No additional management support is needed unless otherwise documented below in the visit note. 

## 2013-08-24 NOTE — Assessment & Plan Note (Signed)
Continue with current prescription therapy as reflected on the Med list.  

## 2013-08-26 ENCOUNTER — Encounter: Payer: Self-pay | Admitting: Internal Medicine

## 2013-08-26 NOTE — Assessment & Plan Note (Signed)
Resolved

## 2013-08-27 LAB — NMR LIPOPROFILE WITHOUT LIPIDS
HDL Particle Number: 24.7 umol/L — ABNORMAL LOW (ref >=30.5)
HDL Size: 8.7 nm — ABNORMAL LOW (ref >=9.2)
LDL Particle Number: 1374 nmol/L — ABNORMAL HIGH (ref <1000)
LDL Size: 20.3 nm — ABNORMAL LOW (ref >20.5)
LP-IR Score: 52 — ABNORMAL HIGH (ref <=45)
Large HDL-P: 3.2 umol/L — ABNORMAL LOW (ref >=4.8)
Large VLDL-P: 1.5 nmol/L (ref <=2.7)
Small LDL Particle Number: 821 nmol/L — ABNORMAL HIGH (ref <=527)
VLDL Size: 45.2 nm (ref <=46.6)

## 2013-09-17 ENCOUNTER — Telehealth: Payer: Self-pay | Admitting: *Deleted

## 2013-09-17 NOTE — Telephone Encounter (Signed)
Pt states his medicare plan is not covering the 08/24/13. He has a bill from [place] for $225. He wants to know if the visit can be re-adusted or re-coded so that he does not owe. Please advise.

## 2013-09-18 NOTE — Telephone Encounter (Signed)
[name] [name], Can you help please? Thx

## 2013-10-11 DIAGNOSIS — L821 Other seborrheic keratosis: Secondary | ICD-10-CM | POA: Diagnosis not present

## 2013-10-11 DIAGNOSIS — L719 Rosacea, unspecified: Secondary | ICD-10-CM | POA: Diagnosis not present

## 2013-10-11 DIAGNOSIS — L905 Scar conditions and fibrosis of skin: Secondary | ICD-10-CM | POA: Diagnosis not present

## 2013-10-11 DIAGNOSIS — D1801 Hemangioma of skin and subcutaneous tissue: Secondary | ICD-10-CM | POA: Diagnosis not present

## 2014-03-18 ENCOUNTER — Other Ambulatory Visit: Payer: Self-pay | Admitting: Cardiology

## 2014-04-21 NOTE — Telephone Encounter (Signed)
A user error has taken place: encounter opened in error, closed for administrative reasons.

## 2014-06-06 ENCOUNTER — Encounter: Payer: Self-pay | Admitting: *Deleted

## 2014-06-06 ENCOUNTER — Ambulatory Visit: Payer: Medicare Other | Admitting: Cardiology

## 2014-06-06 ENCOUNTER — Ambulatory Visit (INDEPENDENT_AMBULATORY_CARE_PROVIDER_SITE_OTHER): Payer: Medicare Other | Admitting: Cardiology

## 2014-06-06 ENCOUNTER — Encounter: Payer: Self-pay | Admitting: Cardiology

## 2014-06-06 VITALS — BP 142/86 | HR 55 | Ht 71.0 in | Wt 206.8 lb

## 2014-06-06 DIAGNOSIS — I359 Nonrheumatic aortic valve disorder, unspecified: Secondary | ICD-10-CM | POA: Diagnosis not present

## 2014-06-06 DIAGNOSIS — I719 Aortic aneurysm of unspecified site, without rupture: Secondary | ICD-10-CM

## 2014-06-06 DIAGNOSIS — I480 Paroxysmal atrial fibrillation: Secondary | ICD-10-CM

## 2014-06-06 DIAGNOSIS — I4891 Unspecified atrial fibrillation: Secondary | ICD-10-CM

## 2014-06-06 DIAGNOSIS — I10 Essential (primary) hypertension: Secondary | ICD-10-CM | POA: Diagnosis not present

## 2014-06-06 LAB — BASIC METABOLIC PANEL
BUN: 15 mg/dL (ref 6–23)
CO2: 28 mEq/L (ref 19–32)
Calcium: 9.4 mg/dL (ref 8.4–10.5)
Chloride: 103 mEq/L (ref 96–112)
Creatinine, Ser: 1 mg/dL (ref 0.4–1.5)
GFR: 79.41 mL/min (ref >60.00)
Glucose, Bld: 93 mg/dL (ref 70–99)
Potassium: 4 mEq/L (ref 3.5–5.1)
Sodium: 138 mEq/L (ref 135–145)

## 2014-06-06 MED ORDER — METOPROLOL SUCCINATE ER 25 MG PO TB24: 25.0000 mg | ORAL_TABLET | Freq: Two times a day (BID) | ORAL | Status: DC

## 2014-06-06 MED ORDER — HYDROCHLOROTHIAZIDE 25 MG PO TABS: 12.5000 mg | ORAL_TABLET | Freq: Every day | ORAL | Status: DC

## 2014-06-06 NOTE — Patient Instructions (Signed)
Your physician recommends that you have lab work today--BMET.   Your physician has requested that you have an echocardiogram. Echocardiography is a painless test that uses sound waves to create images of your heart. It provides your doctor with information about the size and shape of your heart and how well your heart's chambers and valves are working. This procedure takes approximately one hour. There are no restrictions for this procedure.  Schedule an appointment for an MRA of your chest.  Your physician wants you to follow-up in: 1 year with Dr Aundra Dubin. (September 2016). You will receive a reminder letter in the mail two months in advance. If you don't receive a letter, please call our office to schedule the follow-up appointment.

## 2014-06-07 ENCOUNTER — Ambulatory Visit (HOSPITAL_COMMUNITY): Payer: Medicare Other | Attending: Cardiology | Admitting: Radiology

## 2014-06-07 DIAGNOSIS — I359 Nonrheumatic aortic valve disorder, unspecified: Secondary | ICD-10-CM | POA: Insufficient documentation

## 2014-06-07 DIAGNOSIS — I10 Essential (primary) hypertension: Secondary | ICD-10-CM | POA: Insufficient documentation

## 2014-06-07 DIAGNOSIS — I4891 Unspecified atrial fibrillation: Secondary | ICD-10-CM | POA: Insufficient documentation

## 2014-06-07 DIAGNOSIS — I480 Paroxysmal atrial fibrillation: Secondary | ICD-10-CM

## 2014-06-07 DIAGNOSIS — I719 Aortic aneurysm of unspecified site, without rupture: Secondary | ICD-10-CM

## 2014-06-07 NOTE — Progress Notes (Signed)
Patient ID: [name] [name], male   DOB: 1941/02/10, 73 y.o.   MRN: [id number] PCP: Dr. Alain Marion  73 yo with history of HTN and paroxysmal atrial fibrillation returns for followup. He was hospitalized with atrial fibrillation/RVR in 5/14.  He had TEE-guided cardioversion and is now back in NSR.  TEE showed bicuspid aortic valve with mild AS and a moderately dilated ascending aorta.  MRA chest showed that the ascending aortic aneurysm measured 4.3 cm in greatest diameter.    He was on Xarelto for anticoagulation but stopped it as he was convinced it was causing joint pains.  He has refused to restart any form of anticoagulation.  His blood pressure has been well-controlled at home (SBP 120s-130s) though it is mildly elevated in the office today.  His HR runs in the 50s at rest typically.  He denies fatigue or lightheadedness.  No runs of tachypalpitations.  He is in NSR today.  No exertional dyspnea or chest pain.    Labs (5/14): K 3.7, creatinine 0.9, BNP 2261=>109, TSH normal Labs (7/14): K 3.5, creatinine 1.0 Labs (12/14): K 3.8, creatinine 1.0, LDL particle number 1374, LDL 107, TSH normal  ECG: NSR, nonspecific T wave flattening.   Allergies (verified):  No Known Drug Allergies   Past Medical History:  1. Hypertension: ACEI cough.  2. Atrial fibrillation. The patient had new-onset atrial fibrillation in November 2009. He underwent ibutilide cardioversion successfully. He has had 1-2 episodes/year that are short-lived that likely are atrial fibrillation with RVR. He was admitted in 5/14 with atrial fibrillation/RVR and had TEE-guided DCCV.  CHADSVASC score 2.  3. Hypercholesterolemia.  4. Bicuspid aortic valve disorder: Echo (7/11): EF 55-60%, mild LV hypertrophy, mild aortic stenosis with mean gradient 19 mmHg and peak gradient 36 mmHg.  TEE (5/14): EF 55%, mild LVH, bicuspid aortic valve with mild AS (mean gradient 13 mmHg), ascending aorta 4.5 cm.   5. Osteoarthritis.  6. Low back pain.  7.  Chest pain: ETT-myoview (12/11) with 10:51 exercise, no chest pain, no significant ST changes, EF 69%, no evidence for ischemia or infarction.  8. Ascending aortic aneurysm: Associated with bicuspid aortic valve.  4.5 cm by TEE in 5/14.   MRA chest (6/14) with bicuspid aortic valve, 4.3 cm ascending aortic aneurysm.   Family History:  Hypertension. No early CAD.   Social History:  The patient lives in [place]. He is a native of [place]. He is retired Chief Financial Officer at [employer]. He drinks a glass of wine nightly. He is a nonsmoker. He is a former Microbiologist.   Review of Systems  All systems reviewed and negative except as per HPI.   Current Outpatient Prescriptions  Medication Sig Dispense Refill  . aspirin EC 81 MG tablet Take 1 tablet (81 mg total) by mouth daily.      . Cholecalciferol 1000 UNITS capsule Take 1,000 Units by mouth daily.      . hydrochlorothiazide (HYDRODIURIL) 25 MG tablet Take 0.5 tablets (12.5 mg total) by mouth daily.  45 tablet  3  . metoprolol succinate (TOPROL-XL) 25 MG 24 hr tablet Take 1 tablet (25 mg total) by mouth 2 (two) times daily.  180 tablet  3   No current facility-administered medications for this visit.    BP 142/86  Pulse 55  Ht 5\' 11"  (1.803 m)  Wt 206 lb 12.8 oz (93.804 kg)  BMI 28.86 kg/m2 General: NAD Neck: No JVD, no thyromegaly or thyroid nodule.  Lungs: Clear to auscultation bilaterally with  normal respiratory effort. CV: Nondisplaced PMI.  Heart regular S1/S2, no S3/S4, 3/6 SEM RUSB with clear S2.  No peripheral edema.  No carotid bruit.  Normal pedal pulses.  Abdomen: Soft, nontender, no hepatosplenomegaly, no distention.  Neurologic: Alert and oriented x 3.  Psych: Normal affect. Extremities: No clubbing or cyanosis.   Assessment/Plan: 1. Atrial fibrillation: Paroxysmal. He is in NSR today.  No recent atrial fibrillation symptoms.  He has only come to the hospital twice with atrial fibrillation, once in 2009 and  again in 5/14.  CHADSVASC = 2.    - He refuses to take another anticoagulant (thought Xarelto caused joint pains).  I have had discussions with him about stroke risk and he understands.  He will continue to take ASA 81 mg daily. - Continue Toprol XL.   - Hold off on anti-arrhythmic for now.  If atrial fibrillation episodes begin to come more frequently, he would be a candidate for dronedarone or a type Ic agent.   2. HTN: BP stable on Toprol XL and HCTZ.   3. Bicuspid aortic valve disorder: Only mild AS.  MRA chest showed 4.3 cm ascending aortic aneurysm.  I will have him get repeat MRA chest and echocardiogram.   [name] 06/07/2014

## 2014-06-07 NOTE — Progress Notes (Signed)
Echocardiogram performed.  

## 2014-06-10 ENCOUNTER — Other Ambulatory Visit: Payer: Self-pay | Admitting: Cardiology

## 2014-06-10 DIAGNOSIS — Z23 Encounter for immunization: Secondary | ICD-10-CM | POA: Diagnosis not present

## 2014-06-20 ENCOUNTER — Ambulatory Visit (HOSPITAL_COMMUNITY)
Admission: RE | Admit: 2014-06-20 | Discharge: 2014-06-20 | Disposition: A | Discharge status: Home / Self Care | Payer: Medicare Other | Source: Ambulatory Visit | Attending: Cardiology | Admitting: Cardiology

## 2014-06-20 DIAGNOSIS — I719 Aortic aneurysm of unspecified site, without rupture: Secondary | ICD-10-CM | POA: Insufficient documentation

## 2014-06-20 DIAGNOSIS — I712 Thoracic aortic aneurysm, without rupture: Secondary | ICD-10-CM | POA: Diagnosis not present

## 2014-06-20 DIAGNOSIS — I480 Paroxysmal atrial fibrillation: Secondary | ICD-10-CM | POA: Insufficient documentation

## 2014-06-20 DIAGNOSIS — I359 Nonrheumatic aortic valve disorder, unspecified: Secondary | ICD-10-CM

## 2014-06-20 MED ORDER — GADOBENATE DIMEGLUMINE 529 MG/ML IV SOLN
20.0000 mL | Freq: Once | INTRAVENOUS | Status: AC | PRN
  Administered 2014-06-20: 20 mL via INTRAVENOUS

## 2015-03-04 ENCOUNTER — Encounter: Payer: Self-pay | Admitting: Internal Medicine

## 2015-03-04 ENCOUNTER — Ambulatory Visit (INDEPENDENT_AMBULATORY_CARE_PROVIDER_SITE_OTHER): Payer: Medicare Other | Admitting: Internal Medicine

## 2015-03-04 ENCOUNTER — Other Ambulatory Visit (INDEPENDENT_AMBULATORY_CARE_PROVIDER_SITE_OTHER): Payer: Medicare Other

## 2015-03-04 VITALS — BP 150/84 | HR 60 | Ht 71.0 in | Wt 201.0 lb

## 2015-03-04 DIAGNOSIS — N320 Bladder-neck obstruction: Secondary | ICD-10-CM

## 2015-03-04 DIAGNOSIS — E785 Hyperlipidemia, unspecified: Secondary | ICD-10-CM

## 2015-03-04 DIAGNOSIS — Z0000 Encounter for general adult medical examination without abnormal findings: Secondary | ICD-10-CM

## 2015-03-04 DIAGNOSIS — I10 Essential (primary) hypertension: Secondary | ICD-10-CM

## 2015-03-04 DIAGNOSIS — I480 Paroxysmal atrial fibrillation: Secondary | ICD-10-CM

## 2015-03-04 DIAGNOSIS — Z23 Encounter for immunization: Secondary | ICD-10-CM | POA: Diagnosis not present

## 2015-03-04 LAB — CBC WITH DIFFERENTIAL/PLATELET
Basophils Absolute: 0 10*3/uL (ref 0.0–0.1)
Basophils Relative: 0.4 % (ref 0.0–3.0)
Eosinophils Absolute: 0.2 10*3/uL (ref 0.0–0.7)
Eosinophils Relative: 3.2 % (ref 0.0–5.0)
HCT: 42.1 % (ref 39.0–52.0)
Hemoglobin: 14.3 g/dL (ref 13.0–17.0)
Lymphocytes Relative: 23.6 % (ref 12.0–46.0)
Lymphs Abs: 1.1 10*3/uL (ref 0.7–4.0)
MCHC: 33.8 g/dL (ref 30.0–36.0)
MCV: 91.7 fl (ref 78.0–100.0)
Monocytes Absolute: 0.5 10*3/uL (ref 0.1–1.0)
Monocytes Relative: 10.8 % (ref 3.0–12.0)
Neutro Abs: 3 10*3/uL (ref 1.4–7.7)
Neutrophils Relative %: 62 % (ref 43.0–77.0)
Platelets: 190 10*3/uL (ref 150.0–400.0)
RBC: 4.59 Mil/uL (ref 4.22–5.81)
RDW: 13 % (ref 11.5–15.5)
WBC: 4.9 10*3/uL (ref 4.0–10.5)

## 2015-03-04 LAB — URINALYSIS
Bilirubin Urine: NEGATIVE
Hgb urine dipstick: NEGATIVE
Ketones, ur: NEGATIVE
Leukocytes, UA: NEGATIVE
Nitrite: NEGATIVE
Specific Gravity, Urine: 1.015 (ref 1.000–1.030)
Urine Glucose: NEGATIVE
Urobilinogen, UA: 0.2 (ref 0.0–1.0)
pH: 7 (ref 5.0–8.0)

## 2015-03-04 LAB — HEPATIC FUNCTION PANEL
ALT: 21 U/L (ref 0–53)
AST: 20 U/L (ref 0–37)
Albumin: 4 g/dL (ref 3.5–5.2)
Alkaline Phosphatase: 72 U/L (ref 39–117)
Bilirubin, Direct: 0.1 mg/dL (ref 0.0–0.3)
Total Bilirubin: 0.5 mg/dL (ref 0.2–1.2)
Total Protein: 8 g/dL (ref 6.0–8.3)

## 2015-03-04 LAB — BASIC METABOLIC PANEL
BUN: 13 mg/dL (ref 6–23)
CO2: 30 mEq/L (ref 19–32)
Calcium: 9.2 mg/dL (ref 8.4–10.5)
Chloride: 104 mEq/L (ref 96–112)
Creatinine, Ser: 0.84 mg/dL (ref 0.40–1.50)
GFR: 94.68 mL/min (ref >60.00)
Glucose, Bld: 96 mg/dL (ref 70–99)
Potassium: 3.8 mEq/L (ref 3.5–5.1)
Sodium: 139 mEq/L (ref 135–145)

## 2015-03-04 LAB — LIPID PANEL
Cholesterol: 142 mg/dL (ref 0–200)
HDL: 30.3 mg/dL — ABNORMAL LOW (ref >39.00)
LDL Cholesterol: 85 mg/dL (ref 0–99)
NonHDL: 111.7
Total CHOL/HDL Ratio: 5
Triglycerides: 134 mg/dL (ref 0.0–149.0)
VLDL: 26.8 mg/dL (ref 0.0–40.0)

## 2015-03-04 LAB — PSA: PSA: 2.76 ng/mL (ref 0.10–4.00)

## 2015-03-04 LAB — TSH: TSH: 4.36 u[IU]/mL (ref 0.35–4.50)

## 2015-03-04 NOTE — Assessment & Plan Note (Addendum)
Chronic, sub-optimal control. Nl BP at home per pt Issues w/med intolerance Metoprolol, hydrodiuril

## 2015-03-04 NOTE — Progress Notes (Signed)
Pre visit review using our clinic review tool, if applicable. No additional management support is needed unless otherwise documented below in the visit note. 

## 2015-03-04 NOTE — Assessment & Plan Note (Signed)
PAF 12/14 pt refused anticoagulation; agrees to take a baby ASA only
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
## 2015-03-04 NOTE — Assessment & Plan Note (Signed)
Here for medicare wellness/physical  Diet: heart healthy  Physical activity: not sedentary  Depression/mood screen: negative  Hearing: intact to whispered voice  Visual acuity: grossly normal, performs annual eye exam  ADLs: capable  Fall risk: none  Home safety: good  Cognitive evaluation: intact to orientation, naming, recall and repetition  EOL planning: adv directives, full code/ I agree  I have personally reviewed and have noted  1. The patient's medical, surgical and social history  2. Their use of alcohol, tobacco or illicit drugs  3. Their current medications and supplements  4. The patient's functional ability including ADL's, fall risks, home safety risks and hearing or visual impairment.  5. Diet and physical activities  6. Evidence for depression or mood disorders 7. The roster of all physicians providing medical care to patient - is listed in the Snapshot section of the chart and reviewed today.    Today patient counseled on age appropriate routine health concerns for screening and prevention, each reviewed and up to date or declined. Immunizations reviewed and up to date or declined. Labs ordered and reviewed. Risk factors for depression reviewed and negative. Hearing function and visual acuity are intact. ADLs screened and addressed as needed. Functional ability and level of safety reviewed and appropriate. Education, counseling and referrals performed based on assessed risks today. Patient provided with a copy of personalized plan for preventive services.   

## 2015-03-10 ENCOUNTER — Other Ambulatory Visit: Payer: Self-pay

## 2015-04-16 ENCOUNTER — Telehealth: Payer: Self-pay | Admitting: Cardiology

## 2015-04-16 NOTE — Telephone Encounter (Signed)
I have already given that appt slot to [name] [name] for a different patient, so you cannot use that slot for this patient.  I would offer him an appt with PA/NP on a day Dr Aundra Dubin is going to be in the office

## 2015-04-16 NOTE — Telephone Encounter (Signed)
New message   Pt wants to see his Dr. Only and not a PA, the pt received a recall letter to see Dr. Aundra Dubin  I noticed a hold for Aug 19 @ 2:00pm, pt do not want 1st available, he states he is   going out of the country and needs to see Dr. In Aug

## 2015-04-17 NOTE — Telephone Encounter (Signed)
Pt states he is requesting an October appt with Dr Aundra Dubin before he goes out of the country in November. Pt has been scheduled to see Dr Aundra Dubin in October

## 2015-04-17 NOTE — Telephone Encounter (Signed)
LMTCB

## 2015-05-02 ENCOUNTER — Encounter: Payer: Self-pay | Admitting: Gastroenterology

## 2015-05-22 ENCOUNTER — Other Ambulatory Visit: Payer: Self-pay | Admitting: Cardiology

## 2015-06-01 ENCOUNTER — Other Ambulatory Visit: Payer: Self-pay | Admitting: Cardiology

## 2015-07-04 ENCOUNTER — Ambulatory Visit (INDEPENDENT_AMBULATORY_CARE_PROVIDER_SITE_OTHER): Payer: Medicare Other | Admitting: Cardiology

## 2015-07-04 ENCOUNTER — Encounter: Payer: Self-pay | Admitting: Cardiology

## 2015-07-04 VITALS — BP 150/88 | HR 48 | Ht 71.0 in | Wt 201.1 lb

## 2015-07-04 DIAGNOSIS — I712 Thoracic aortic aneurysm, without rupture, unspecified: Secondary | ICD-10-CM

## 2015-07-04 DIAGNOSIS — I480 Paroxysmal atrial fibrillation: Secondary | ICD-10-CM | POA: Diagnosis not present

## 2015-07-04 DIAGNOSIS — I359 Nonrheumatic aortic valve disorder, unspecified: Secondary | ICD-10-CM

## 2015-07-04 DIAGNOSIS — I10 Essential (primary) hypertension: Secondary | ICD-10-CM | POA: Diagnosis not present

## 2015-07-04 NOTE — Patient Instructions (Signed)
Medication Instructions:  Your physician recommends that you continue on your current medications as directed. Please refer to the Current Medication list given to you today. If you need a refill on your cardiac medications before your next appointment, please call your pharmacy.   Labwork: none  Testing/Procedures: Your physician has requested that you have an echocardiogram. Echocardiography is a painless test that uses sound waves to create images of your heart. It provides your doctor with information about the size and shape of your heart and how well your heart's chambers and valves are working. This procedure takes approximately one hour. There are no restrictions for this procedure. IN 6 MONTHS     Follow-Up: Your physician wants you to follow-up in: 6 months with Dr. Aundra Dubin. You will receive a reminder letter in the mail two months in advance. If you don't receive a letter, please call our office to schedule the follow-up appointment.   Any Other Special Instructions Will Be Listed Below (If Applicable).  Your physician has requested that you regularly monitor and record your blood pressure readings at home. Please use the same machine at the same time of day to check your readings and record them to bring to your follow-up visit.  CALL OUR OFFICE END OF NOVEMBER WITH READINGS.

## 2015-07-05 NOTE — Progress Notes (Signed)
Patient ID: [name] [name], male   DOB: [birth year], 74 y.o.   MRN: [id number] PCP: Dr. Alain Marion  74 yo with history of HTN and paroxysmal atrial fibrillation returns for followup. He was hospitalized with atrial fibrillation/RVR in 5/14.  He had TEE-guided cardioversion and is now back in NSR.  TEE showed bicuspid aortic valve with mild AS and a moderately dilated ascending aorta.  MRA chest showed that the ascending aortic aneurysm measured 4.3 cm in greatest diameter.  Most recent echo in 9/15 showed moderate aortic stenosis and MRA chest in 10/15 showed 4.2 cm ascending aorta.   He was on Xarelto for anticoagulation but stopped it as he was convinced it was causing joint pains.  He has refused to restart any form of anticoagulation.  His blood pressure has been trending higher recently, says it is running in the 130s-140s at home.  It is 150/88 here.  His HR runs in the 50s at rest typically.  He denies fatigue or lightheadedness.  No runs of tachypalpitations.  He is in NSR today.  No exertional dyspnea or chest pain.    Labs (5/14): K 3.7, creatinine 0.9, BNP 2261=>109, TSH normal Labs (7/14): K 3.5, creatinine 1.0 Labs (12/14): K 3.8, creatinine 1.0, LDL particle number 1374, LDL 107, TSH normal Labs (6/16): TSH normal, K 3.8, creatinine 0.84, HCT 42.4, LDL 85, LFTs normal  ECG: NSR, normal  Allergies (verified):  No Known Drug Allergies   Past Medical History:  1. Hypertension: ACEI cough.  2. Atrial fibrillation. The patient had new-onset atrial fibrillation in November 2009. He underwent ibutilide cardioversion successfully. He has had 1-2 episodes/year that are short-lived that likely are atrial fibrillation with RVR. He was admitted in 75/14 with atrial fibrillation/RVR and had TEE-guided DCCV.  CHADSVASC score 2.  3. Hypercholesterolemia.  4. Bicuspid aortic valve disorder: Echo (7/11): EF 55-60%, mild LV hypertrophy, mild aortic stenosis with mean gradient 19 mmHg and peak gradient 36  mmHg.  TEE (5/14): EF 55%, mild LVH, bicuspid aortic valve with mild AS (mean gradient 13 mmHg), ascending aorta 4.5 cm.  Echo (9/15) with EF 65-70%, moderate AS (mean gradient 23 mmHg), mild AI, ascending aorta 4.1 cm, mild MR.   5. Osteoarthritis.  6. Low back pain.  7. Chest pain: ETT-myoview (12/11) with 10:51 exercise, no chest pain, no significant ST changes, EF 69%, no evidence for ischemia or infarction.  8. Ascending aortic aneurysm: Associated with bicuspid aortic valve.  4.5 cm by TEE in 5/14.   MRA chest (6/14) with bicuspid aortic valve, 4.3 cm ascending aortic aneurysm.  MRA chest (10/15) with 4.2 cm ascending aorta (bicuspid aortic valve noted).   Family History:  Hypertension. No early CAD.   Social History:  The patient lives in [place]. He is a native of [place]. He is retired Chief Financial Officer at [employer]. He drinks a glass of wine nightly. He is a nonsmoker. He is a former Microbiologist.   Review of Systems  All systems reviewed and negative except as per HPI.   Current Outpatient Prescriptions  Medication Sig Dispense Refill  . aspirin EC 81 MG tablet Take 1 tablet (81 mg total) by mouth daily.    . Cholecalciferol 1000 UNITS capsule Take 1,000 Units by mouth daily.    . metoprolol succinate (TOPROL-XL) 25 MG 24 hr tablet TAKE 1 TABLET (25 MG TOTAL) BY MOUTH 2 (TWO) TIMES DAILY. 180 tablet 0   No current facility-administered medications for this visit.    BP 150/88  mmHg  Pulse 48  Ht 5\' 11"  (1.803 m)  Wt 201 lb 1.9 oz (91.227 kg)  BMI 28.06 kg/m2 General: NAD Neck: No JVD, no thyromegaly or thyroid nodule.  Lungs: Clear to auscultation bilaterally with normal respiratory effort. CV: Nondisplaced PMI.  Heart regular S1/S2, no S3/S4, 3/6 SEM RUSB with clear S2.  No peripheral edema.  No carotid bruit.  Normal pedal pulses.  Abdomen: Soft, nontender, no hepatosplenomegaly, no distention.  Neurologic: Alert and oriented x 3.  Psych: Normal  affect. Extremities: No clubbing or cyanosis.   Assessment/Plan: 1. Atrial fibrillation: Paroxysmal. He is in NSR today.  No recent atrial fibrillation symptoms.  He has only come to the hospital twice with atrial fibrillation, once in 2009 and again in 5/14.  CHADSVASC = 3.    - He refuses to take another anticoagulant (thought Xarelto caused joint pains).  I have had discussions with him about stroke risk and he understands.  He will continue to take ASA 81 mg daily. - Continue Toprol XL.   - Hold off on anti-arrhythmic for now.  If atrial fibrillation episodes begin to come more frequently, he would be a candidate for dronedarone or a type Ic agent.   2. HTN: BP seems to be running high on only Toprol XL.  He is reticent to take another medication.  I recommended lisinopril.  He will check BP at home daily x 2-3 weeks and we will call him to see what his numbers are running.  If he is consistently high, I will again encourage him to try lisinopril.  3. Bicuspid aortic valve disorder: Moderate AS on 9/15 echo, progressed.  MRA chest showed 4.2 cm ascending aortic aneurysm in 10/15.  I will have him get a repeat echo this year to look for any progression.  Will repeat MRA chest in 10/17.   Followup in 6 months.   [name] 07/04/1975

## 2015-08-05 DIAGNOSIS — Z23 Encounter for immunization: Secondary | ICD-10-CM | POA: Diagnosis not present

## 2015-08-21 ENCOUNTER — Telehealth: Payer: Self-pay | Admitting: Cardiology

## 2015-08-21 MED ORDER — METOPROLOL SUCCINATE ER 25 MG PO TB24
ORAL_TABLET | ORAL | Status: DC
Start: 2015-08-21 — End: 2016-01-26

## 2015-08-21 NOTE — Telephone Encounter (Signed)
BP high at times.  Would he be willing to start lisinopril at low dose, 5 mg daily? Would then need BMET in 2 wks and call for BP check.

## 2015-08-21 NOTE — Telephone Encounter (Signed)
New Message  Pt called to speak w/ RN- would not specify nature of call. Please call back and discuss.

## 2015-08-21 NOTE — Telephone Encounter (Signed)
Pt agreed to start lisinopril 5mg  daily, BMET 09/04/15. Pt states he will check his BP daily, call with BP readings in about 2 weeks.

## 2015-08-21 NOTE — Telephone Encounter (Signed)
I called pt back to let him know Dr Aundra Dubin was going to start losartan 25mg  instead of lisinopril (cough from benazepril). Pt has decided that he does not want to start additional medication for his BP. Pt states that he will continue to take and record his BP and call if consistently over 140/90.

## 2015-08-21 NOTE — Telephone Encounter (Signed)
Pt states since office visit with Dr Aundra Dubin- BP lowest reading 126/70--  highest reading 155/80--pt states heart rate 53-60.  Pt advised I will forward to Dr Aundra Dubin for review.

## 2015-11-19 ENCOUNTER — Other Ambulatory Visit (INDEPENDENT_AMBULATORY_CARE_PROVIDER_SITE_OTHER): Payer: Medicare Other

## 2015-11-19 ENCOUNTER — Ambulatory Visit (INDEPENDENT_AMBULATORY_CARE_PROVIDER_SITE_OTHER)
Admission: RE | Admit: 2015-11-19 | Discharge: 2015-11-19 | Disposition: A | Discharge status: Home / Self Care | Payer: Medicare Other | Source: Ambulatory Visit | Attending: Internal Medicine | Admitting: Internal Medicine

## 2015-11-19 ENCOUNTER — Ambulatory Visit (INDEPENDENT_AMBULATORY_CARE_PROVIDER_SITE_OTHER): Payer: Medicare Other | Admitting: Internal Medicine

## 2015-11-19 VITALS — BP 120/82 | HR 76 | Temp 98.9°F | Resp 20 | Wt 207.0 lb

## 2015-11-19 DIAGNOSIS — M545 Low back pain, unspecified: Secondary | ICD-10-CM

## 2015-11-19 DIAGNOSIS — R109 Unspecified abdominal pain: Secondary | ICD-10-CM

## 2015-11-19 DIAGNOSIS — M5136 Other intervertebral disc degeneration, lumbar region: Secondary | ICD-10-CM | POA: Diagnosis not present

## 2015-11-19 DIAGNOSIS — I10 Essential (primary) hypertension: Secondary | ICD-10-CM

## 2015-11-19 DIAGNOSIS — M5134 Other intervertebral disc degeneration, thoracic region: Secondary | ICD-10-CM | POA: Diagnosis not present

## 2015-11-19 DIAGNOSIS — L719 Rosacea, unspecified: Secondary | ICD-10-CM | POA: Diagnosis not present

## 2015-11-19 DIAGNOSIS — R10A1 Flank pain, right side: Secondary | ICD-10-CM | POA: Insufficient documentation

## 2015-11-19 LAB — HEPATIC FUNCTION PANEL
ALT: 22 U/L (ref 0–53)
AST: 22 U/L (ref 0–37)
Albumin: 4 g/dL (ref 3.5–5.2)
Alkaline Phosphatase: 65 U/L (ref 39–117)
Bilirubin, Direct: 0.1 mg/dL (ref 0.0–0.3)
Total Bilirubin: 0.4 mg/dL (ref 0.2–1.2)
Total Protein: 8.2 g/dL (ref 6.0–8.3)

## 2015-11-19 LAB — URINALYSIS, ROUTINE W REFLEX MICROSCOPIC
Bilirubin Urine: NEGATIVE
Hgb urine dipstick: NEGATIVE
Ketones, ur: NEGATIVE
Leukocytes, UA: NEGATIVE
Nitrite: NEGATIVE
Specific Gravity, Urine: 1.02 (ref 1.000–1.030)
Urine Glucose: NEGATIVE
Urobilinogen, UA: 0.2 (ref 0.0–1.0)
pH: 6.5 (ref 5.0–8.0)

## 2015-11-19 LAB — CBC WITH DIFFERENTIAL/PLATELET
Basophils Absolute: 0 10*3/uL (ref 0.0–0.1)
Basophils Relative: 0.3 % (ref 0.0–3.0)
Eosinophils Absolute: 0.3 10*3/uL (ref 0.0–0.7)
Eosinophils Relative: 4.4 % (ref 0.0–5.0)
HCT: 39 % (ref 39.0–52.0)
Hemoglobin: 13.3 g/dL (ref 13.0–17.0)
Lymphocytes Relative: 21.2 % (ref 12.0–46.0)
Lymphs Abs: 1.2 10*3/uL (ref 0.7–4.0)
MCHC: 34 g/dL (ref 30.0–36.0)
MCV: 90.9 fl (ref 78.0–100.0)
Monocytes Absolute: 0.5 10*3/uL (ref 0.1–1.0)
Monocytes Relative: 8.6 % (ref 3.0–12.0)
Neutro Abs: 3.8 10*3/uL (ref 1.4–7.7)
Neutrophils Relative %: 65.5 % (ref 43.0–77.0)
Platelets: 174 10*3/uL (ref 150.0–400.0)
RBC: 4.29 Mil/uL (ref 4.22–5.81)
RDW: 13.7 % (ref 11.5–15.5)
WBC: 5.8 10*3/uL (ref 4.0–10.5)

## 2015-11-19 LAB — BASIC METABOLIC PANEL
BUN: 17 mg/dL (ref 6–23)
CO2: 30 mEq/L (ref 19–32)
Calcium: 9.3 mg/dL (ref 8.4–10.5)
Chloride: 103 mEq/L (ref 96–112)
Creatinine, Ser: 0.87 mg/dL (ref 0.40–1.50)
GFR: 90.74 mL/min (ref >60.00)
Glucose, Bld: 92 mg/dL (ref 70–99)
Potassium: 3.8 mEq/L (ref 3.5–5.1)
Sodium: 139 mEq/L (ref 135–145)

## 2015-11-19 MED ORDER — HYDROCHLOROTHIAZIDE 12.5 MG PO CAPS: 12.5000 mg | ORAL_CAPSULE | Freq: Every day | ORAL | Status: DC

## 2015-11-19 MED ORDER — METRONIDAZOLE 0.75 % EX GEL: 1.0000 "application " | Freq: Two times a day (BID) | CUTANEOUS | Status: DC

## 2015-11-19 MED ORDER — TRAMADOL HCL 50 MG PO TABS: 50.0000 mg | ORAL_TABLET | Freq: Four times a day (QID) | ORAL | Status: DC | PRN

## 2015-11-19 NOTE — Progress Notes (Signed)
Subjective:    Patient ID: [name] [name], male    DOB: 1941/08/09, 75 y.o.   MRN: [id number]  HPI  Here with lower back pain to the mid lumbar/lower thoracic and right lumbar paravertebral area/right flank, 3 wks, moderate, constant persistent, better to lie down, worse with knife like pain with driving, bending, twisting, has flares occas severe, but no bowel or bladder change, fever, wt loss,  worsening LE pain/numbness/weakness, gait change or falls.  S/p lumbar disc fusion surgury L3-4 about 10 yrs ago. Has milder recurring pains but can avoid most of this by modifying his activity and avoids heavy lifting, no running.  Uses a back belt for support for any lifting.  Pt denies fever, wt loss, night sweats, loss of appetite, or other constitutional symptoms.  No falls, fever, Concerned he may have more than muscle pain as pain going on for 3 wks.  BP has been mild elevated in the 140's with onset pain, usually in the 120's on current tx. Has not been taking the HCt 12.5 per cardiology recnetly, wants to re-start.   Denies urinary symptoms such as dysuria, frequency, urgency, flank pain, hematuria or n/v, fever, chills.  Denies worsening reflux, abd pain, dysphagia, n/v, bowel change or blood.  Also asks for refill metrogel for rosacea, rx by derm and helped but now out. Past Medical History  Diagnosis Date  . HTN (hypertension)   . A-fib ([place])     a. The Pt had new onset Afib in 07/2008. He underwent ibutilide cardioversion successfully. He has had 1-2 episodes/year that are short-lived that likely are  Afib w/RVR. CHADSVASC score 2;  b. 01/2013 TEE/DCCV->Xarelto initiated.  . Hypercholesteremia   . Aortic stenosis, mild     Echo 07/11: 55-60%, mild LV hypertrophy, mild aortic stenosis w/mean gradient 20mmHg and peak gradient 36 mmHg.  . Osteoarthritis   . LBP (low back pain)   . Chest pain     ETT-myoview 12/11 w/exercise, no chest pain, no significant ST changes, EF 69%, no evidence for ischemia  or infarction.   Past Surgical History  Procedure Laterality Date  . Back surgery  x12 years ago  . Lumbar laminectomy    . Hemorrhoid surgery    . Tee without cardioversion N/A 01/26/2013    Procedure: TRANSESOPHAGEAL ECHOCARDIOGRAM (TEE);  Surgeon: Larey Dresser, MD;  Location: [facility];  Service: Cardiovascular;  Laterality: N/A;  . Cardioversion N/A 01/26/2013    Procedure: CARDIOVERSION;  Surgeon: Larey Dresser, MD;  Location: [facility] ENDOSCOPY;  Service: Cardiovascular;  Laterality: N/A;    reports that he has never smoked. He does not have any smokeless tobacco history on file. He reports that he drinks alcohol. He reports that he does not use illicit drugs. family history includes Cancer (age of onset: 22) in his mother; Hypertension in his other. There is no history of Coronary artery disease. Allergies  Allergen Reactions  . Benazepril     cough   Current Outpatient Prescriptions on File Prior to Visit  Medication Sig Dispense Refill  . aspirin EC 81 MG tablet Take 1 tablet (81 mg total) by mouth daily.    . Cholecalciferol 1000 UNITS capsule Take 1,000 Units by mouth daily.    . metoprolol succinate (TOPROL-XL) 25 MG 24 hr tablet TAKE 1 TABLET (25 MG TOTAL) BY MOUTH 2 (TWO) TIMES DAILY. 180 tablet 3   No current facility-administered medications on file prior to visit.   Review of Systems  Constitutional: Negative for  unusual diaphoresis or night sweats HENT: Negative for ringing in ear or discharge Eyes: Negative for double vision or worsening visual disturbance.  Respiratory: Negative for choking and stridor.   Gastrointestinal: Negative for vomiting or other signifcant bowel change Genitourinary: Negative for hematuria or change in urine volume.  Musculoskeletal: Negative for other MSK pain or swelling Skin: Negative for color change and worsening wound.  Neurological: Negative for tremors and numbness other than noted  Psychiatric/Behavioral: Negative for  decreased concentration or agitation other than above       Objective:   Physical Exam BP 120/82 mmHg  Pulse 76  Temp(Src) 98.9 F (37.2 C) (Oral)  Resp 20  Wt 207 lb (93.895 kg)  SpO2 96% VS noted,  Constitutional: Pt appears in no significant distress HENT: Head: NCAT.  Right Ear: External ear normal.  Left Ear: External ear normal.  Eyes: . Pupils are equal, round, and reactive to light. Conjunctivae and EOM are normal Neck: Normal range of motion. Neck supple.  Cardiovascular: Normal rate and regular rhythm.   Pulmonary/Chest: Effort normal and breath sounds without rales or wheezing.  Abd:  Soft, NT, ND, + BS Neurological: Pt is alert. Not confused , motor grossly intact Skin: Skin is warm. + bilat facial rash, no LE edema Psychiatric: Pt behavior is normal. No agitation.  Spine Nontender, + mild muscular spasm liekly it seems to right flank area compared to left, but NT, no swelling    Assessment & Plan:

## 2015-11-19 NOTE — Progress Notes (Signed)
Pre visit review using our clinic review tool, if applicable. No additional management support is needed unless otherwise documented below in the visit note. 

## 2015-11-19 NOTE — Assessment & Plan Note (Signed)
Overall stable overall by history and exam, recent data reviewed with pt, and pt to continue medical treatment as before,  to f/u any worsening symptoms or concerns, to re-start the HCT

## 2015-11-19 NOTE — Assessment & Plan Note (Signed)
Also for UA and lab as ordered, r/o renal vs other involvement,  to f/u any worsening symptoms or concerns

## 2015-11-19 NOTE — Assessment & Plan Note (Signed)
Etiology unclear, suspect MSK strain with persistent pain, declines muscle relaxer, gave tramadol prn, also for films today,  to f/u any worsening symptoms or concerns

## 2015-11-19 NOTE — Assessment & Plan Note (Signed)
[place] for [employer] asd,  to f/u any worsening symptoms or concerns

## 2015-11-19 NOTE — Patient Instructions (Signed)
Please take all new medication as prescribed - the pain medication as needed  Please continue all other medications as before, and refills have been done if requested - the gel, and the HCT fluid pill  Please have the pharmacy call with any other refills you may need.  Please keep your appointments with your specialists as you may have planned  Please go to the XRAY Department in the Basement (go straight as you get off the elevator) for the x-ray testing  Please go to the LAB in the Basement (turn left off the elevator) for the tests to be done today  You will be contacted by phone if any changes need to be made immediately.  Otherwise, you will receive a letter about your results with an explanation, but please check with MyChart first.  Please remember to sign up for MyChart if you have not done so, as this will be important to you in the future with finding out test results, communicating by private email, and scheduling acute appointments online when needed.

## 2016-01-02 ENCOUNTER — Other Ambulatory Visit: Payer: Self-pay

## 2016-01-02 ENCOUNTER — Ambulatory Visit (HOSPITAL_COMMUNITY): Payer: Medicare Other | Attending: Cardiology

## 2016-01-02 DIAGNOSIS — I352 Nonrheumatic aortic (valve) stenosis with insufficiency: Secondary | ICD-10-CM | POA: Diagnosis not present

## 2016-01-02 DIAGNOSIS — I119 Hypertensive heart disease without heart failure: Secondary | ICD-10-CM | POA: Insufficient documentation

## 2016-01-02 DIAGNOSIS — I340 Nonrheumatic mitral (valve) insufficiency: Secondary | ICD-10-CM | POA: Diagnosis not present

## 2016-01-02 DIAGNOSIS — I350 Nonrheumatic aortic (valve) stenosis: Secondary | ICD-10-CM | POA: Diagnosis present

## 2016-01-02 DIAGNOSIS — I77810 Thoracic aortic ectasia: Secondary | ICD-10-CM | POA: Insufficient documentation

## 2016-01-02 DIAGNOSIS — I480 Paroxysmal atrial fibrillation: Secondary | ICD-10-CM | POA: Diagnosis not present

## 2016-01-02 DIAGNOSIS — E785 Hyperlipidemia, unspecified: Secondary | ICD-10-CM | POA: Insufficient documentation

## 2016-01-26 ENCOUNTER — Ambulatory Visit (INDEPENDENT_AMBULATORY_CARE_PROVIDER_SITE_OTHER): Payer: Medicare Other | Admitting: Cardiology

## 2016-01-26 ENCOUNTER — Encounter: Payer: Self-pay | Admitting: Cardiology

## 2016-01-26 ENCOUNTER — Encounter: Payer: Self-pay | Admitting: *Deleted

## 2016-01-26 VITALS — BP 152/98 | HR 118 | Ht 71.0 in | Wt 196.8 lb

## 2016-01-26 DIAGNOSIS — I359 Nonrheumatic aortic valve disorder, unspecified: Secondary | ICD-10-CM

## 2016-01-26 DIAGNOSIS — I4891 Unspecified atrial fibrillation: Secondary | ICD-10-CM | POA: Diagnosis not present

## 2016-01-26 DIAGNOSIS — I480 Paroxysmal atrial fibrillation: Secondary | ICD-10-CM

## 2016-01-26 LAB — BASIC METABOLIC PANEL
BUN: 15 mg/dL (ref 7–25)
CO2: 29 mmol/L (ref 20–31)
Calcium: 9.2 mg/dL (ref 8.6–10.3)
Chloride: 102 mmol/L (ref 98–110)
Creat: 0.97 mg/dL (ref 0.70–1.18)
Glucose, Bld: 95 mg/dL (ref 65–99)
Potassium: 4.5 mmol/L (ref 3.5–5.3)
Sodium: 142 mmol/L (ref 135–146)

## 2016-01-26 LAB — PROTIME-INR
INR: 1 (ref <1.50)
Prothrombin Time: 13.3 seconds (ref 11.6–15.2)

## 2016-01-26 LAB — CBC WITH DIFFERENTIAL/PLATELET
Basophils Absolute: 0 cells/uL (ref 0–200)
Basophils Relative: 0 %
Eosinophils Absolute: 102 cells/uL (ref 15–500)
Eosinophils Relative: 2 %
HCT: 43.7 % (ref 38.5–50.0)
Hemoglobin: 14.8 g/dL (ref 13.2–17.1)
Lymphocytes Relative: 32 %
Lymphs Abs: 1632 cells/uL (ref 850–3900)
MCH: 31.2 pg (ref 27.0–33.0)
MCHC: 33.9 g/dL (ref 32.0–36.0)
MCV: 92 fL (ref 80.0–100.0)
MPV: 11 fL (ref 7.5–12.5)
Monocytes Absolute: 459 cells/uL (ref 200–950)
Monocytes Relative: 9 %
Neutro Abs: 2907 cells/uL (ref 1500–7800)
Neutrophils Relative %: 57 %
Platelets: 194 10*3/uL (ref 140–400)
RBC: 4.75 MIL/uL (ref 4.20–5.80)
RDW: 13.3 % (ref 11.0–15.0)
WBC: 5.1 10*3/uL (ref 3.8–10.8)

## 2016-01-26 LAB — TSH: TSH: 4.22 mIU/L (ref 0.40–4.50)

## 2016-01-26 MED ORDER — APIXABAN 5 MG PO TABS: 5.0000 mg | ORAL_TABLET | Freq: Two times a day (BID) | ORAL | Status: DC

## 2016-01-26 MED ORDER — METOPROLOL SUCCINATE ER 50 MG PO TB24: 50.0000 mg | ORAL_TABLET | Freq: Two times a day (BID) | ORAL | Status: DC

## 2016-01-26 NOTE — Patient Instructions (Addendum)
Medication Instructions:  Stop aspirin.   Start Eliquis 5mg  two times a day. Take your first dose tonight.  Increase metoprolol succinate (Toprol XL) to 50mg  two times a day. You can take 2 of your 25mg  tablets two times a day and use your current supply.  Labwork: BMET/CBCd/TSH/BNP/PT/INR today  Testing/Procedures: Your physician has requested that you have a TEE/Cardioversion. During a TEE, sound waves are used to create images of your heart. It provides your doctor with information about the size and shape of your heart and how well your heart's chambers and valves are working. In this test, a transducer is attached to the end of a flexible tube that is guided down you throat and into your esophagus (the tube leading from your mouth to your stomach) to get a more detailed image of your heart. Once the TEE has determined that a blood clot is not present, the cardioversion begins. Electrical Cardioversion uses a jolt of electricity to your heart either through paddles or wired patches attached to your chest. This is a controlled, usually prescheduled, procedure. This procedure is done at the hospital and you are not awake during the procedure. You usually go home the day of the procedure. Please see the instruction sheet given to you today for more information. Thursday MAY 18,2017   Follow-Up: Your physician recommends that you schedule a follow-up appointment in: 1 month with Dr Aundra Dubin.         If you need a refill on your cardiac medications before your next appointment, please call your pharmacy.

## 2016-01-27 NOTE — Progress Notes (Signed)
Patient ID: [name] [name], male   DOB: 05-15-1941, 75 y.o.   MRN: [id number] PCP: Dr. Alain Marion  75 yo with history of HTN and paroxysmal atrial fibrillation returns for followup. He was hospitalized with atrial fibrillation/RVR in 5/14.  He had TEE-guided cardioversion and is now back in NSR.  TEE showed bicuspid aortic valve with mild AS and a moderately dilated ascending aorta.  MRA chest showed that the ascending aortic aneurysm measured 4.3 cm in greatest diameter.  Most recent echo in 9/15 showed moderate aortic stenosis and MRA chest in 10/15 showed 4.2 cm ascending aorta.   He was on Xarelto for anticoagulation but stopped it as he was convinced it was causing joint pains.  He has refused to restart any form of anticoagulation.    Today, he is back in atrial fibrillation.  He has felt the palpitations since Sunday, and HR has been up during that time.  It is very bothersome to him.  He has stayed in bed.  No chest pain, no dyspnea, +fatigue.   Prior to this atrial fibrillation episode, he had felt fine, walking 2-2.5 miles/day and riding his bike.   Labs (5/14): K 3.7, creatinine 0.9, BNP 2261=>109, TSH normal Labs (7/14): K 3.5, creatinine 1.0 Labs (12/14): K 3.8, creatinine 1.0, LDL particle number 1374, LDL 107, TSH normal Labs (6/16): TSH normal, K 3.8, creatinine 0.84, HCT 42.4, LDL 85, LFTs normal Labs (3/17): K 3.8, creatinine 0.87  ECG: atrial fibrillation at 118, nonspecific T wave changes.   Allergies (verified):  No Known Drug Allergies   Past Medical History:  1. Hypertension: ACEI cough.  2. Atrial fibrillation. The patient had new-onset atrial fibrillation in November 2009. He underwent ibutilide cardioversion successfully. He has had 1-2 episodes/year that are short-lived that likely are atrial fibrillation with RVR. He was admitted in 5/14 with atrial fibrillation/RVR and had TEE-guided DCCV.  CHADSVASC score 2.  3. Hypercholesterolemia.  4. Bicuspid aortic valve  disorder: Echo (7/11): EF 55-60%, mild LV hypertrophy, mild aortic stenosis with mean gradient 19 mmHg and peak gradient 36 mmHg.  TEE (5/14): EF 55%, mild LVH, bicuspid aortic valve with mild AS (mean gradient 13 mmHg), ascending aorta 4.5 cm.  Echo (9/15) with EF 65-70%, moderate AS (mean gradient 23 mmHg), mild AI, ascending aorta 4.1 cm, mild MR.   - Echo (4/17) with EF 65-70%, moderate aortic stenosis with mean gradient 27 mmHg, PASP 31 mmHg, ascending aorta 4.4 cm.  5. Osteoarthritis.  6. Low back pain.  7. Chest pain: ETT-myoview (12/11) with 10:51 exercise, no chest pain, no significant ST changes, EF 69%, no evidence for ischemia or infarction.  8. Ascending aortic aneurysm: Associated with bicuspid aortic valve.  4.5 cm by TEE in 5/14.   MRA chest (6/14) with bicuspid aortic valve, 4.3 cm ascending aortic aneurysm.  MRA chest (10/15) with 4.2 cm ascending aorta (bicuspid aortic valve noted).  Echo (4/17) with ascending aorta diameter 4.4 cm.   Family History:  Hypertension. No early CAD.   Social History:  The patient lives in [place]. He is a native of [place]. He is retired Chief Financial Officer at [employer]. He drinks a glass of wine nightly. He is a nonsmoker. He is a former Microbiologist.   Review of Systems  All systems reviewed and negative except as per HPI.   Current Outpatient Prescriptions  Medication Sig Dispense Refill  . Cholecalciferol 1000 UNITS capsule Take 1,000 Units by mouth daily.    . hydrochlorothiazide (MICROZIDE) 12.5 MG  capsule Take 1 capsule (12.5 mg total) by mouth daily. 90 capsule 3  . [DISCONTINUED] aspirin EC 81 MG tablet Take 1 tablet (81 mg total) by mouth daily.    [name] apixaban (ELIQUIS) 5 MG TABS tablet Take 1 tablet (5 mg total) by mouth 2 (two) times daily. 60 tablet 1  . metoprolol succinate (TOPROL-XL) 50 MG 24 hr tablet Take 1 tablet (50 mg total) by mouth 2 (two) times daily. Take with or immediately following a meal. 180 tablet 3   No  current facility-administered medications for this visit.    BP 152/98 mmHg  Pulse 118  Ht 5\' 11"  (1.803 m)  Wt 196 lb 12.8 oz (89.268 kg)  BMI 27.46 kg/m2 General: NAD Neck: No JVD, no thyromegaly or thyroid nodule.  Lungs: Clear to auscultation bilaterally with normal respiratory effort. CV: Nondisplaced PMI.  Heart mildly tachy, irregular S1/S2, no S3/S4, 3/6 SEM RUSB with clear S2.  No peripheral edema.  No carotid bruit.  Normal pedal pulses.  Abdomen: Soft, nontender, no hepatosplenomegaly, no distention.  Neurologic: Alert and oriented x 3.  Psych: Normal affect. Extremities: No clubbing or cyanosis.   Assessment/Plan: 1. Atrial fibrillation: Paroxysmal. He is back in afib with RVR today.  He feels the palpitations, these are very bothersome.  CHADSVASC = 3.    - He will start Eliquis 5 mg bid tonight.  He will also increase Toprol XL to 50 mg bid.   - If he remains in atrial fibrillation at the end of the week, will plan TEE-guided DCCV. - Last symptomatic atrial fib was several years ago, so hopefully will not need antiarrhythmic.  - I will encourage him to stay on anticoagulation long-term.  - Check CBC, TSH, BMET today.  - Stop ASA.  2. HTN: BP elevated today.  Continue HCTZ, increasing Toprol XL.   3. Bicuspid aortic valve disorder: Moderate AS on 4/17 echo, stable.  Echo showed ascending aorta 4.4 cm.  Will repeat MRA chest in 10/17.   Followup in 1 month.    [name] 01/27/2016

## 2016-01-29 ENCOUNTER — Encounter (HOSPITAL_COMMUNITY): Payer: Self-pay | Admitting: Certified Registered"

## 2016-01-29 ENCOUNTER — Encounter (HOSPITAL_COMMUNITY)
Admission: RE | Disposition: A | Discharge status: Home / Self Care | Payer: Self-pay | Source: Ambulatory Visit | Attending: Cardiology

## 2016-01-29 ENCOUNTER — Ambulatory Visit (HOSPITAL_COMMUNITY)
Admission: RE | Admit: 2016-01-29 | Discharge: 2016-01-29 | Disposition: A | Discharge status: Home / Self Care | Payer: Medicare Other | Source: Ambulatory Visit | Attending: Cardiology | Admitting: Cardiology

## 2016-01-29 DIAGNOSIS — I4891 Unspecified atrial fibrillation: Secondary | ICD-10-CM | POA: Diagnosis not present

## 2016-01-29 DIAGNOSIS — Z5309 Procedure and treatment not carried out because of other contraindication: Secondary | ICD-10-CM | POA: Insufficient documentation

## 2016-01-29 SURGERY — CANCELLED PROCEDURE

## 2016-01-29 NOTE — Progress Notes (Signed)
Patient admitted to Endo and placed on monitor. Patient appeared to be in NSR. EKG obtained and NSR confirmed by Dr. Aundra Dubin. Dr. Aundra Dubin instructed patient to keep follow up appointment and he could be discharged. Patient left with understanding between him and his spouse.

## 2016-03-04 DIAGNOSIS — L304 Erythema intertrigo: Secondary | ICD-10-CM | POA: Diagnosis not present

## 2016-03-04 DIAGNOSIS — L309 Dermatitis, unspecified: Secondary | ICD-10-CM | POA: Diagnosis not present

## 2016-03-05 ENCOUNTER — Ambulatory Visit (INDEPENDENT_AMBULATORY_CARE_PROVIDER_SITE_OTHER): Payer: Medicare Other | Admitting: Cardiology

## 2016-03-05 ENCOUNTER — Encounter: Payer: Self-pay | Admitting: Cardiology

## 2016-03-05 VITALS — BP 136/68 | HR 50 | Ht 71.0 in | Wt 198.0 lb

## 2016-03-05 DIAGNOSIS — I359 Nonrheumatic aortic valve disorder, unspecified: Secondary | ICD-10-CM

## 2016-03-05 DIAGNOSIS — I10 Essential (primary) hypertension: Secondary | ICD-10-CM | POA: Diagnosis not present

## 2016-03-05 DIAGNOSIS — I480 Paroxysmal atrial fibrillation: Secondary | ICD-10-CM | POA: Diagnosis not present

## 2016-03-05 MED ORDER — ASPIRIN EC 81 MG PO TBEC: 81.0000 mg | DELAYED_RELEASE_TABLET | Freq: Every day | ORAL | Status: DC

## 2016-03-05 NOTE — Patient Instructions (Addendum)
Medication Instructions:  Your physician recommends that you continue on your current medications as directed. Please refer to the Current Medication list given to you today.  Labwork: None ordered  Testing/Procedures: None ordered  Follow-Up: Your physician wants you to follow-up in: December 2017 with Dr. Aundra Dubin. You will receive a reminder letter in the mail two months in advance. If you don't receive a letter, please call our office to schedule the follow-up appointment.  If you need a refill on your cardiac medications before your next appointment, please call your pharmacy.  Thank you for choosing [facility]!!

## 2016-03-07 NOTE — Progress Notes (Signed)
Patient ID: [name] [name], male   DOB: 04-16-1941, 75 y.o.   MRN: [id number] PCP: Dr. Alain Marion  75 yo with history of HTN and paroxysmal atrial fibrillation returns for followup. He was hospitalized with atrial fibrillation/RVR in 5/14.  He had TEE-guided cardioversion and is now back in NSR.  TEE showed bicuspid aortic valve with mild AS and a moderately dilated ascending aorta.  MRA chest showed that the ascending aortic aneurysm measured 4.3 cm in greatest diameter.  Most recent echo in 9/15 showed moderate aortic stenosis and MRA chest in 10/15 showed 4.2 cm ascending aorta.   He was on Xarelto for anticoagulation but stopped it as he was convinced it was causing joint pains.  He has refused to restart any form of anticoagulation at that time.    At last appointment in 5/17, he had been back in atrial fibrillation for several days and was symptomatic.  I started him on Eliquis and planned TEE-guided DCCV given significant symptoms, but he converted back to NSR on his own.  He continued Eliquis for about 1 month then stopped it on his own.    Today, he is in NSR.  He has not felt palpitations.  No chest pain, no exertional dyspnea.  He feels back to normal.  When he stopped Eliquis, he started back on ASA 81.   Labs (5/14): K 3.7, creatinine 0.9, BNP 2261=>109, TSH normal Labs (7/14): K 3.5, creatinine 1.0 Labs (12/14): K 3.8, creatinine 1.0, LDL particle number 1374, LDL 107, TSH normal Labs (6/16): TSH normal, K 3.8, creatinine 0.84, HCT 42.4, LDL 85, LFTs normal Labs (3/17): K 3.8, creatinine 0.87 Labs (5/17): K 4.5, creatinine 0.97, HCT 43.7  ECG: NSR at 50, inferior nonspecific T wave inversions  Allergies (verified):  No Known Drug Allergies   Past Medical History:  1. Hypertension: ACEI cough.  2. Atrial fibrillation. The patient had new-onset atrial fibrillation in November 2009. He underwent ibutilide cardioversion successfully. He has had 1-2 episodes/year that are short-lived  that likely are atrial fibrillation with RVR. He was admitted in 5/14 with atrial fibrillation/RVR and had TEE-guided DCCV.  Atrial fibrillation again in 5/17, converted back to NSR spontaneously.  CHADSVASC score 2.  3. Hypercholesterolemia.  4. Bicuspid aortic valve disorder: Echo (7/11): EF 55-60%, mild LV hypertrophy, mild aortic stenosis with mean gradient 19 mmHg and peak gradient 36 mmHg.  TEE (5/14): EF 55%, mild LVH, bicuspid aortic valve with mild AS (mean gradient 13 mmHg), ascending aorta 4.5 cm.  Echo (9/15) with EF 65-70%, moderate AS (mean gradient 23 mmHg), mild AI, ascending aorta 4.1 cm, mild MR.   - Echo (4/17) with EF 65-70%, moderate aortic stenosis with mean gradient 27 mmHg, PASP 31 mmHg, ascending aorta 4.4 cm.  5. Osteoarthritis.  6. Low back pain.  7. Chest pain: ETT-myoview (12/11) with 10:51 exercise, no chest pain, no significant ST changes, EF 69%, no evidence for ischemia or infarction.  8. Ascending aortic aneurysm: Associated with bicuspid aortic valve.  4.5 cm by TEE in 5/14.   MRA chest (6/14) with bicuspid aortic valve, 4.3 cm ascending aortic aneurysm.  MRA chest (10/15) with 4.2 cm ascending aorta (bicuspid aortic valve noted).  Echo (4/17) with ascending aorta diameter 4.4 cm.   Family History:  Hypertension. No early CAD.   Social History:  The patient lives in [place]. He is a native of [place]. He is retired Chief Financial Officer at [employer]. He drinks a glass of wine nightly. He is a nonsmoker. He  is a former Microbiologist.   Review of Systems  All systems reviewed and negative except as per HPI.   Current Outpatient Prescriptions  Medication Sig Dispense Refill  . Cholecalciferol 1000 UNITS capsule Take 1,000 Units by mouth daily.    . hydrochlorothiazide (MICROZIDE) 12.5 MG capsule Take 1 capsule (12.5 mg total) by mouth daily. 90 capsule 3  . metoprolol succinate (TOPROL-XL) 50 MG 24 hr tablet Take 1 tablet (50 mg total) by mouth 2 (two) times  daily. Take with or immediately following a meal. 180 tablet 3  . aspirin EC 81 MG tablet Take 1 tablet (81 mg total) by mouth daily. 30 tablet 3   No current facility-administered medications for this visit.    BP 136/68 mmHg  Pulse 50  Ht 5\' 11"  (1.803 m)  Wt 198 lb (89.812 kg)  BMI 27.63 kg/m2 General: NAD Neck: No JVD, no thyromegaly or thyroid nodule.  Lungs: Clear to auscultation bilaterally with normal respiratory effort. CV: Nondisplaced PMI.  Heart regular S1/S2, no S3/S4, 3/6 SEM RUSB with clear S2.  No peripheral edema.  No carotid bruit.  Normal pedal pulses.  Abdomen: Soft, nontender, no hepatosplenomegaly, no distention.  Neurologic: Alert and oriented x 3.  Psych: Normal affect. Extremities: No clubbing or cyanosis.   Assessment/Plan: 1. Atrial fibrillation: Paroxysmal. He is symptomatic when in atrial fibrillation but has only had an episode every couple of years.  CHADSVASC = 3.    - He is off Eliquis now, only stayed on it for about a month.  As in the past, he is adamant about not staying on anticoagulation long-term.  He understands the stroke risk and we discussed this again today.  He is taking ASA 81 daily.    - No indication for antiarrhythmic given very infrequent (though symptomatic) atrial fibrillation episodes.   2. HTN: BP stable on current regimen of Toprol XL and HCTZ.    3. Bicuspid aortic valve disorder: Moderate AS on 4/17 echo, stable.  Echo showed ascending aorta 4.4 cm.  Will repeat MRA chest in 10/17.   Followup in 6 months.     [name] 03/07/2016

## 2016-07-02 ENCOUNTER — Ambulatory Visit (INDEPENDENT_AMBULATORY_CARE_PROVIDER_SITE_OTHER): Payer: Medicare Other | Admitting: Internal Medicine

## 2016-07-02 ENCOUNTER — Ambulatory Visit (INDEPENDENT_AMBULATORY_CARE_PROVIDER_SITE_OTHER)
Admission: RE | Admit: 2016-07-02 | Discharge: 2016-07-02 | Disposition: A | Discharge status: Home / Self Care | Payer: Medicare Other | Source: Ambulatory Visit | Attending: Internal Medicine | Admitting: Internal Medicine

## 2016-07-02 VITALS — BP 130/82 | HR 60 | Temp 97.8°F | Resp 16 | Wt 204.0 lb

## 2016-07-02 DIAGNOSIS — M545 Low back pain, unspecified: Secondary | ICD-10-CM

## 2016-07-02 DIAGNOSIS — M5136 Other intervertebral disc degeneration, lumbar region: Secondary | ICD-10-CM | POA: Diagnosis not present

## 2016-07-02 MED ORDER — BACLOFEN 10 MG PO TABS: 10.0000 mg | ORAL_TABLET | Freq: Three times a day (TID) | ORAL | 0 refills | Status: DC | PRN
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
## 2016-07-02 NOTE — Progress Notes (Signed)
Pre visit review using our clinic review tool, if applicable. No additional management support is needed unless otherwise documented below in the visit note. 

## 2016-07-02 NOTE — Patient Instructions (Addendum)
An x-ray was ordered.  We will cal you with the results  Medications reviewed and updated.  Changes include starting advil regularly for 2-3 days and a muscle relaxer up to three times a day as needed.  Your prescription(s) have been submitted to your pharmacy. Please take as directed and contact our office if you believe you are having problem(s) with the medication(s).   Please followup if your symptoms do not improve.

## 2016-07-03 ENCOUNTER — Encounter: Payer: Self-pay | Admitting: Internal Medicine

## 2016-07-03 NOTE — Assessment & Plan Note (Signed)
Likely muscular strain after injury with a rowing machine breaking No spine tenderness or deformity. No muscular tenderness in the lower spine, but this is the area of his discomfort He seems very concerned about internal bleeding or organ damage, which I do not feel is the case Lumbar x-ray today Continue ibuprofen Follow-up next week if no improvement or if symptoms worsen

## 2016-07-05 ENCOUNTER — Telehealth: Payer: Self-pay | Admitting: Internal Medicine

## 2016-07-05 ENCOUNTER — Ambulatory Visit: Payer: Self-pay | Admitting: Family Medicine

## 2016-07-05 NOTE — Telephone Encounter (Signed)
Patient Name: ASHAI BUNCE DOB: 10-02-1940 Initial Comment Caller states having constipation, pain in middle of back Nurse Assessment Nurse: Ronnald Ramp, RN, Miranda Date/Time (Eastern Time): 07/05/2016 10:13:29 AM Confirm and document reason for call. If symptomatic, describe symptoms. You must click the next button to save text entered. ---Caller states he fell from his exercise machine and landed on his back 5 days ago. He was seen for this and prescribed muscle relaxer. He has continued to have pain. Also having problems with Constipation, last BM was 5 days ago. Has the patient traveled out of the country within the last 30 days? ---Not Applicable Does the patient have any new or worsening symptoms? ---Yes Will a triage be completed? ---Yes Related visit to physician within the last 2 weeks? ---Yes Does the PT have any chronic conditions? (i.e. diabetes, asthma, etc.) ---Yes List chronic conditions. ---HX of surgery to his back 15 years ago, HTN Is this a behavioral health or substance abuse call? ---No Guidelines Guideline Title Affirmed Question Affirmed Notes Back Injury [1] High-risk adult (e.g., age > 25, osteoporosis, chronic steroid use) AND [2] still hurts Constipation Last bowel movement (BM) > 4 days ago Final Disposition User See Physician within 24 Hours Jones, RN, Miranda Comments Pain rated now, 6-7/10 Baclofen TID, last dose 3 hrs ago. Advil 400 mg Q4-5 hrs, last dose 2.5 hrs ago Pt is upset because he was told last week to call today to see Dr. Alain Marion and he is not able to see him. No appt available with PCP within this week and no appt available within 24 hours at primary office. Appt scheduled at [facility] office with Howard Pouch at 1:30pm Referrals GO TO FACILITY OTHER - SPECIFY Disagree/Comply: Comply

## 2016-07-06 NOTE — Telephone Encounter (Signed)
OV w/any provider pls I could work/in on Thur Use Miralax prn Thx

## 2016-07-06 NOTE — Telephone Encounter (Signed)
Pt informed of below. He is still upset about his visit last week and he wants to be seen sooner. I advised him PCP was given all this information and of his response/advice below.   He states he may or may not come in to see PCP on Thurs depending on if he is treated elsewhere sooner.  OV scheduled 07/08/16 @11 :30 with PCP. I advised him to come Thursday or go to ER/US if needed sooner and call us if he wishes to cancel appt on 07/08/16.

## 2016-07-06 NOTE — Telephone Encounter (Signed)
Pt called to check up on this, please advise, he really need someone to call him back today.

## 2016-07-08 ENCOUNTER — Ambulatory Visit: Payer: Medicare Other | Admitting: Internal Medicine

## 2016-08-02 ENCOUNTER — Encounter (HOSPITAL_COMMUNITY): Payer: Self-pay | Admitting: Emergency Medicine

## 2016-08-02 ENCOUNTER — Emergency Department (HOSPITAL_COMMUNITY): Payer: Medicare Other

## 2016-08-02 ENCOUNTER — Observation Stay (HOSPITAL_COMMUNITY)
Admission: EM | Admit: 2016-08-02 | Discharge: 2016-08-03 | Disposition: A | Discharge status: Home / Self Care | Payer: Medicare Other | Attending: Cardiovascular Disease | Admitting: Cardiovascular Disease

## 2016-08-02 DIAGNOSIS — E876 Hypokalemia: Secondary | ICD-10-CM | POA: Diagnosis not present

## 2016-08-02 DIAGNOSIS — I351 Nonrheumatic aortic (valve) insufficiency: Secondary | ICD-10-CM

## 2016-08-02 DIAGNOSIS — Z87891 Personal history of nicotine dependence: Secondary | ICD-10-CM | POA: Diagnosis not present

## 2016-08-02 DIAGNOSIS — I4891 Unspecified atrial fibrillation: Secondary | ICD-10-CM | POA: Diagnosis present

## 2016-08-02 DIAGNOSIS — Z7982 Long term (current) use of aspirin: Secondary | ICD-10-CM | POA: Insufficient documentation

## 2016-08-02 DIAGNOSIS — R0602 Shortness of breath: Secondary | ICD-10-CM | POA: Diagnosis not present

## 2016-08-02 DIAGNOSIS — Q231 Congenital insufficiency of aortic valve: Secondary | ICD-10-CM

## 2016-08-02 DIAGNOSIS — R000 Tachycardia, unspecified: Secondary | ICD-10-CM | POA: Diagnosis not present

## 2016-08-02 DIAGNOSIS — I7121 Aneurysm of the ascending aorta, without rupture: Secondary | ICD-10-CM | POA: Diagnosis present

## 2016-08-02 DIAGNOSIS — S3992XA Unspecified injury of lower back, initial encounter: Secondary | ICD-10-CM

## 2016-08-02 DIAGNOSIS — I480 Paroxysmal atrial fibrillation: Principal | ICD-10-CM

## 2016-08-02 DIAGNOSIS — I10 Essential (primary) hypertension: Secondary | ICD-10-CM

## 2016-08-02 DIAGNOSIS — I712 Thoracic aortic aneurysm, without rupture: Secondary | ICD-10-CM

## 2016-08-02 DIAGNOSIS — Z79899 Other long term (current) drug therapy: Secondary | ICD-10-CM | POA: Insufficient documentation

## 2016-08-02 DIAGNOSIS — R079 Chest pain, unspecified: Secondary | ICD-10-CM | POA: Diagnosis not present

## 2016-08-02 DIAGNOSIS — I350 Nonrheumatic aortic (valve) stenosis: Secondary | ICD-10-CM

## 2016-08-02 HISTORY — DX: Nonrheumatic aortic (valve) stenosis: I35.0

## 2016-08-02 HISTORY — DX: Paroxysmal atrial fibrillation: I48.0

## 2016-08-02 HISTORY — DX: Aneurysm of the ascending aorta, without rupture: I71.21

## 2016-08-02 HISTORY — DX: Congenital insufficiency of aortic valve: Q23.1

## 2016-08-02 HISTORY — DX: Bicuspid aortic valve: Q23.81

## 2016-08-02 HISTORY — DX: Thoracic aortic aneurysm, without rupture: I71.2

## 2016-08-02 LAB — PROTIME-INR
INR: 1.14
Prothrombin Time: 14.7 seconds (ref 11.4–15.2)

## 2016-08-02 LAB — BRAIN NATRIURETIC PEPTIDE: B Natriuretic Peptide: 372.4 pg/mL — ABNORMAL HIGH (ref 0.0–100.0)

## 2016-08-02 LAB — BASIC METABOLIC PANEL
Anion gap: 8 (ref 5–15)
BUN: 13 mg/dL (ref 6–20)
CO2: 26 mmol/L (ref 22–32)
Calcium: 9.3 mg/dL (ref 8.9–10.3)
Chloride: 105 mmol/L (ref 101–111)
Creatinine, Ser: 1.01 mg/dL (ref 0.61–1.24)
GFR calc Af Amer: >60 mL/min (ref >60)
GFR calc non Af Amer: >60 mL/min (ref >60)
Glucose, Bld: 107 mg/dL — ABNORMAL HIGH (ref 65–99)
Potassium: 3.9 mmol/L (ref 3.5–5.1)
Sodium: 139 mmol/L (ref 135–145)

## 2016-08-02 LAB — CBC
HCT: 42.1 % (ref 39.0–52.0)
Hemoglobin: 14.5 g/dL (ref 13.0–17.0)
MCH: 31.5 pg (ref 26.0–34.0)
MCHC: 34.4 g/dL (ref 30.0–36.0)
MCV: 91.5 fL (ref 78.0–100.0)
Platelets: 193 10*3/uL (ref 150–400)
RBC: 4.6 MIL/uL (ref 4.22–5.81)
RDW: 12.6 % (ref 11.5–15.5)
WBC: 4.3 10*3/uL (ref 4.0–10.5)

## 2016-08-02 LAB — T4, FREE: Free T4: 0.81 ng/dL (ref 0.61–1.12)

## 2016-08-02 LAB — I-STAT TROPONIN, ED: Troponin i, poc: 0.02 ng/mL (ref 0.00–0.08)

## 2016-08-02 LAB — TSH: TSH: 3.387 u[IU]/mL (ref 0.350–4.500)

## 2016-08-02 MED ORDER — DILTIAZEM LOAD VIA INFUSION
20.0000 mg | Freq: Once | INTRAVENOUS | Status: AC
  Administered 2016-08-02: 20 mg via INTRAVENOUS
  Filled 2016-08-02: qty 20

## 2016-08-02 MED ORDER — VITAMIN D 1000 UNITS PO TABS
1000.0000 [IU] | ORAL_TABLET | Freq: Every day | ORAL | Status: DC
  Administered 2016-08-03: 1000 [IU] via ORAL
  Filled 2016-08-02: qty 1

## 2016-08-02 MED ORDER — METOPROLOL SUCCINATE ER 50 MG PO TB24
50.0000 mg | ORAL_TABLET | Freq: Two times a day (BID) | ORAL | Status: DC
  Administered 2016-08-02 – 2016-08-03 (×2): 50 mg via ORAL
  Filled 2016-08-02 (×2): qty 1

## 2016-08-02 MED ORDER — ASPIRIN 81 MG PO CHEW
324.0000 mg | CHEWABLE_TABLET | Freq: Once | ORAL | Status: AC
  Administered 2016-08-02: 324 mg via ORAL
  Filled 2016-08-02: qty 4

## 2016-08-02 MED ORDER — BACLOFEN 10 MG PO TABS: 10.0000 mg | ORAL_TABLET | Freq: Three times a day (TID) | ORAL | Status: DC | PRN

## 2016-08-02 MED ORDER — APIXABAN 5 MG PO TABS
5.0000 mg | ORAL_TABLET | Freq: Two times a day (BID) | ORAL | Status: DC
  Administered 2016-08-02 – 2016-08-03 (×2): 5 mg via ORAL
  Filled 2016-08-02 (×2): qty 1

## 2016-08-02 MED ORDER — ONDANSETRON HCL 4 MG/2ML IJ SOLN: 4.0000 mg | Freq: Four times a day (QID) | INTRAMUSCULAR | Status: DC | PRN

## 2016-08-02 MED ORDER — ACETAMINOPHEN 325 MG PO TABS
650.0000 mg | ORAL_TABLET | ORAL | Status: DC | PRN
  Administered 2016-08-03: 650 mg via ORAL
  Filled 2016-08-02: qty 2

## 2016-08-02 MED ORDER — DILTIAZEM HCL 100 MG IV SOLR
5.0000 mg/h | INTRAVENOUS | Status: DC
  Administered 2016-08-02: 7.5 mg/h via INTRAVENOUS
  Administered 2016-08-02: 5 mg/h via INTRAVENOUS
  Filled 2016-08-02 (×2): qty 100

## 2016-08-02 MED ORDER — HYDROCHLOROTHIAZIDE 12.5 MG PO CAPS
12.5000 mg | ORAL_CAPSULE | Freq: Every day | ORAL | Status: DC
  Filled 2016-08-02: qty 1

## 2016-08-02 NOTE — ED Notes (Signed)
Patient handed urinal to use.

## 2016-08-02 NOTE — H&P (Signed)
Cardiology History & Physical    Patient ID: [name] [name] MRN: [id number], DOB: September 28, 1940 Date of Encounter: 08/02/2016, 2:36 PM Primary Physician: [name], MD Primary Cardiologist: Dr. Aundra Dubin  Chief Complaint: irregular rhythm Reason for Admission: persistent atrial fib Requesting MD: Dr. Tyrone Nine  HPI: [name] [name] is a 75 y.o. Cro[place] (English speaking) male with history of paroxysmal atrial fibrillation (pt refuses chronic anticoagulation), bicuspid aortic valve, moderate AS, 34mm ascending aortic aneurysm, HTN, HLD, osteoarthritis who presented back to the ED today with symptomatic atrial fib.   Per Dr. Claris Gladden note, atrial fib was diagnosed 07/2008. He underwent ibutilide cardioversion successfully. He has had 1-2 episodes/year that are short-lived that likely are atrial fibrillation with RVR. He was admitted in 5/14 with atrial fibrillation/RVR and had TEE-guided DCCV. He was on Xarelto for anticoagulation but stopped it as he was convinced it was causing joint pains. At appointment in 5/17, he had been back in atrial fibrillation for several days and was symptomatic. The patient was agreeable to short-term Eliquis and TEE/DCCV was planned, but the patient converted to NSR on his own. He continued Eliquis for about 1 month then stopped it on his own, despite education regarding risk of stroke. 2D Echo 12/2015: mild focal basal hypertrophy of septum, EF 65-70%, no RWMA, moderate AS, mild AI, mildly dilated ascending aorta 81mm, mild MR, mild PASP 90mmHg. ETT-myoview (12/11) with 10:51 exercise, no chest pain, no significant ST changes, EF 69%, no evidence for ischemia or infarction.   He remains very active and exercises regularly without chest pain or SOB. About a month however he had an accident where one of the rods came loose on his exercise equipment and he somehow got hit in the back. He saw primary care on 07/02/16 who evaluated him and recommended Advil and muscle relaxers.  He feels that his injury eventually led to recurrence of atrial fib, which it sounds like he first noticed on Friday 07/30/16. His baseline HR is 55-60 but on Friday he noticed it was 111. He felt somewhat funny in his chest with dizziness, similar to prior episodes of atrial fib. In the past he's converted to NSR on his own after several days but he says over the weekend his HR persisted in the 85-95 range. This AM it was still >100 so he came to the ED where he was noted to be in atrial fib RVR. He denies any CP or SOB. He was given 20mg  diltiazem bolus with current gtt with HR improvement to 90s-100s, currently feels better. Labs notable for BNP 372, trop neg, K 3.9, Cr 1.01, Hgb 14.5, glucose 107, CXR raising concern for interstitial edema. No LEE, orthopnea, syncope, bleeding.  When asked about anticoagulation, he says it's not even worth talking about. He says he understands there is risk of stroke but he just doesn't think it's necessary for himself as he fills up his blood draw vials quickly and refuses to consider chronic anticoagulation. He tells me no one's ever tested his "blood density" to see if he even needs a blood thinner. (PT/INR previously wnl on multiple occasions.) He also says he doesn't wish to consider a blood thinner in case he needs any future procedures for his back which continues to give him intermittent trouble.   Past Medical History:  Diagnosis Date  . Aortic stenosis, mild    Echo 07/11: 55-60%, mild LV hypertrophy, mild aortic stenosis w/mean gradient 49mmHg and peak gradient 36 mmHg.  . Ascending aortic aneurysm ([place])   .  Bicuspid aortic valve   . Chest pain    ETT-myoview 12/11 w/exercise, no chest pain, no significant ST changes, EF 69%, no evidence for ischemia or infarction.  [name] HTN (hypertension)   . Hypercholesteremia   . LBP (low back pain)   . Moderate aortic stenosis   . Osteoarthritis   . Paroxysmal atrial fibrillation ([place])    a. new onset Afib in  07/2008. He underwent ibutilide cardioversion successfully. b. Recurrence 01/2013 s/p TEE/DCCV - was on Xarelto but he stopped it as he was convinced it was causing joint pn. c. Recurrence 01/2016 - spont conv to NSR. Pt took Eliquis x1 mo then declined further anticoag.     Surgical History:  Past Surgical History:  Procedure Laterality Date  . BACK SURGERY  x12 years ago  . CARDIOVERSION N/A 01/26/2013   Procedure: CARDIOVERSION;  Surgeon: Larey Dresser, MD;  Location: [place];  Service: Cardiovascular;  Laterality: N/A;  . HEMORRHOID SURGERY    . LUMBAR LAMINECTOMY    . TEE WITHOUT CARDIOVERSION N/A 01/26/2013   Procedure: TRANSESOPHAGEAL ECHOCARDIOGRAM (TEE);  Surgeon: Larey Dresser, MD;  Location: [place];  Service: Cardiovascular;  Laterality: N/A;     Home Meds: Prior to Admission medications   Medication Sig Start Date End Date Taking? Authorizing Provider  aspirin EC 81 MG tablet Take 1 tablet (81 mg total) by mouth daily. 03/05/16   Larey Dresser, MD  baclofen (LIORESAL) 10 MG tablet Take 1 tablet (10 mg total) by mouth 3 (three) times daily as needed for muscle spasms (or muscle pain). 07/02/16   Binnie Rail, MD  Cholecalciferol 1000 UNITS capsule Take 1,000 Units by mouth daily.    Historical Provider, MD  hydrochlorothiazide (MICROZIDE) 12.5 MG capsule Take 1 capsule (12.5 mg total) by mouth daily. 11/19/15   Biagio Borg, MD  metoprolol succinate (TOPROL-XL) 50 MG 24 hr tablet Take 1 tablet (50 mg total) by mouth 2 (two) times daily. Take with or immediately following a meal. 01/26/16   Larey Dresser, MD    Allergies:  Allergies  Allergen Reactions  . Benazepril     cough    Social History   Social History  . Marital status: Married    Spouse name: N/A  . Number of children: N/A  . Years of education: N/A   Occupational History  . 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 Smoking status: Never Smoker  . Smokeless tobacco: Not  on file  . Alcohol use 0.0 oz/week     Comment: Drinks 1 glass of wine nightly.  . Drug use: No  . Sexual activity: Yes   Other Topics Concern  . Not on file   Social History Narrative   Patient lives in [place]. He is a native of [place]. 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     Family History  Problem Relation Age of Onset  . Cancer Mother 64    colon  . Hypertension Other   . Coronary artery disease Neg Hx     Review of Systems:no numbness, tingling, paralysis, gait instability. All other systems reviewed and are otherwise negative except as noted above.  Labs:   Lab Results  Component Value Date   WBC 4.3 08/02/2016   HGB 14.5 08/02/2016   HCT 42.1 08/02/2016   MCV 91.5 08/02/2016   PLT 193 08/02/2016    Recent Labs Lab 08/02/16  1121  NA 139  K 3.9  CL 105  CO2 26  BUN 13  CREATININE 1.01  CALCIUM 9.3  GLUCOSE 107*   No results for input(s): CKTOTAL, CKMB, TROPONINI in the last 72 hours. Lab Results  Component Value Date   CHOL 142 03/04/2015   HDL 30.30 (L) 03/04/2015   LDLCALC 85 03/04/2015   TRIG 134.0 03/04/2015   Lab Results  Component Value Date   DDIMER 0.57 (H) 04/21/2012    Radiology/Studies:  Dg Chest Port 1 View  Result Date: 08/02/2016 CLINICAL DATA:  Central chest pain for 3 days. EXAM: PORTABLE CHEST 1 VIEW COMPARISON:  Five hundred thirteen 1,014 FINDINGS: Increased prominence of the interstitial lung markings raise concern for mild edema. Heart size is upper limits of normal but stable. Patient appears to be mildly rotated towards the left side. Negative for a pneumothorax. No acute bone abnormality. Degenerative facet disease in lower cervical spine. IMPRESSION: Prominent interstitial lung markings raise concern for interstitial edema. No focal airspace disease. Electronically Signed   By: Markus Daft M.D.   On: 08/02/2016 12:05   Wt Readings from Last 3 Encounters:  07/02/16 204 lb (92.5  kg)  03/05/16 198 lb (89.8 kg)  01/26/16 196 lb 12.8 oz (89.3 kg)    EKG: coarse AF 116bpm, nonspecific ST-T changes  Physical Exam: Blood pressure 120/87, pulse 89, temperature 98.7 F (37.1 C), temperature source Oral, resp. rate 15, SpO2 97 %. There is no height or weight on file to calculate BMI. General: Well developed, well nourished M, in no acute distress. Head: Normocephalic, atraumatic, sclera non-icteric, no xanthomas, nares are without discharge.  Neck: Negative for carotid bruits. JVD not elevated. Lungs: Clear bilaterally to auscultation without wheezes, rales, or rhonchi. Breathing is unlabored. Heart: Irregularly irregular, borderline elevated rate, with S1 S2. Soft SEM RUSB. No rubs or gallops appreciated. Abdomen: Soft, non-tender, non-distended with normoactive bowel sounds. No hepatomegaly. No rebound/guarding. No obvious abdominal masses. Msk:  Strength and tone appear normal for age. Atraumatic back. Extremities: No clubbing or cyanosis. No edema.  Distal pedal pulses are 2+ and equal bilaterally. Neuro: Alert and oriented X 3. No focal deficit. No facial asymmetry. Moves all extremities spontaneously. Psych:  Responds to questions appropriately with a normal affect.    Assessment and Plan  75 y.o. Cro[place] male with history of paroxysmal atrial fibrillation (pt refuses chronic anticoagulation), bicuspid aortic valve, moderate AS, 30mm ascending aortic aneurysm, HTN, HLD, osteoarthritis who presented back to the ED today with symptomatic atrial fib.   1. Paroxysmal atrial fib - intermittent recurrence, has been in this for at least 4 days. Long discussion about the risk of stroke if he remains off anticoagulation. He agrees to be on anticoagulation at least for the short term leading up to TEE/DCCV and for at least 1 month afterwards. Per d/w Dr. Gwenlyn Found, will admit, start Eliquis, and continue IV cardizem for now in hopes he will convert overnight on his own. If he  remains in atrial fib, can either a) proceed with 3 weeks of anticoagulation then DCCV followed by 4 weeks of anticoagulation afterwards or b) discharge home and arrange early f/u to consider OP TEE/DCCV. (He does not want to stay in the hospital awaiting anticoagulation load for TEE/DCCV as he states he is still reeling from his weeklong-stay in 2014.) I remain unconvinced of his willingness to continue beyond 82-month post cardioversion, so may need to consider antiarrhythmic therapy or referral to EP to consider Watchman procedure.  Hold ASA while starting Eliquis. Will order Eliquis per pharmacy to ensure he gets the education from pharmacy as well. CHADSVASC 3 for HTN, age. His lab and CXR suggest mild CHF but clinically he currently does not have significant volume overload so will follow with rate control.  2. Bicuspid aortic valve - moderate aortic stenosis/mild aortic insufficiency 12/2015. No recent anginal sx.   3. Ascending aortic aneurysm, 73mm by echo 12/2015 - Dr. Claris Gladden last note indicates MRA 06/2016 but do not see this scheduled. Can consider arranging at f/u.   4. HTN - controlled.  5. Recent back injury - no obvious trauma by exam. No recent gait issues or high risk sx. Patient has tried to remain physically active. Continue PRN rx for muscle spasms.  Signed, Charlie Pitter PA-C 08/02/2016, 2:36 PM Pager: [phone]  Agree with findings by Melina Copa PA-C  Pt of Dr Claris Gladden with PAF, bicuspid AoV and AS with dilated Ao root.  Other probs as outlined. Coming in today with elevated HR. Found to be in AF with RVR prob for past 4 days. HR better on IV dilt. Otherwise Asx. Exam benign. Soft outflow murmur. Will admit, continue IV dilt. Start Eliquis. Home tomorrow. Convert IV ---> PO Cardizem. Arrange OP DCCV 4 weeks of TEE DCCV if decides to do sooner.  Lorretta Harp, M.D., [place], [facility], Laverta [place] [address] [place], [place]   [address]  [phone] 08/02/2016 4:07 PM

## 2016-08-02 NOTE — ED Notes (Signed)
While at rest patient HR remains in the 80's, however with standing patient HR jumping to 170. Pt advised to remain in bed to use urinal or to sit on side of bed, patient non-compliant.

## 2016-08-02 NOTE — ED Triage Notes (Signed)
Pt states he has been having central chest pain x 3days that is worse this morning that feels like a pressure. Pt states he also was dizzy this am when he woke up.

## 2016-08-02 NOTE — Progress Notes (Signed)
ANTICOAGULATION CONSULT NOTE - Initial Consult  Pharmacy Consult for Apixaban Indication: atrial fibrillation  Allergies  Allergen Reactions  . Xarelto [Rivaroxaban] Other (See Comments)    INCREASED BP-HYPERTENSIVE EVENTS  . Benazepril Cough    Patient Measurements: Height: 5\' 11"  (180.3 cm) Weight: 195 lb (88.5 kg) IBW/kg (Calculated) : 75.3   Vital Signs: Temp: 97.8 F (36.6 C) (11/20 1700) Temp Source: Oral (11/20 1700) BP: 141/90 (11/20 1700) Pulse Rate: 77 (11/20 1700)  Labs:  Recent Labs  08/02/16 1121  HGB 14.5  HCT 42.1  PLT 193  CREATININE 1.01    Estimated Creatinine Clearance: 66.3 mL/min (by C-G formula based on SCr of 1.01 mg/dL).   Medical History: Past Medical History:  Diagnosis Date  . Aortic stenosis, mild    Echo 07/11: 55-60%, mild LV hypertrophy, mild aortic stenosis w/mean gradient 58mmHg and peak gradient 36 mmHg.  . Ascending aortic aneurysm ([place])   . Bicuspid aortic valve   . Chest pain    ETT-myoview 12/11 w/exercise, no chest pain, no significant ST changes, EF 69%, no evidence for ischemia or infarction.  [name] HTN (hypertension)   . Hypercholesteremia   . LBP (low back pain)   . Moderate aortic stenosis   . Osteoarthritis   . Paroxysmal atrial fibrillation ([place])    a. new onset Afib in 07/2008. He underwent ibutilide cardioversion successfully. b. Recurrence 01/2013 s/p TEE/DCCV - was on Xarelto but he stopped it as he was convinced it was causing joint pn. c. Recurrence 01/2016 - spont conv to NSR. Pt took Eliquis x1 mo then declined further anticoag.   Assessment: 75yom to begin apixaban for afib. Was on it in the past but only took for one month. Does not meet any criteria for dose adjustment.  Goal of Therapy:  Monitor platelets by anticoagulation protocol: Yes   Plan:  1) Apixaban 5mg  po bid 2) Case management consult for cost 3) Needs education  Deboraha Sprang 08/02/2016,5:48 PM

## 2016-08-02 NOTE — ED Notes (Signed)
Patient handed an urinal to use

## 2016-08-02 NOTE — ED Notes (Signed)
Pt is in room, in gown and on monitor. HR irregular, pt in NAD. Refused blanket. Awaiting EDP.

## 2016-08-02 NOTE — Progress Notes (Signed)
Pt has arrived to 2w from [facility] ED. Vitals stable. Telemetry box applied and CCMD notified. Pt oriented to room. Dinner tray has been ordered. Pt resting in bed with call light within reach. Will continue current plan of care.   Grant Fontana BSN, RN
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
## 2016-08-03 ENCOUNTER — Encounter (HOSPITAL_COMMUNITY): Payer: Self-pay | Admitting: Physician Assistant

## 2016-08-03 DIAGNOSIS — I4891 Unspecified atrial fibrillation: Secondary | ICD-10-CM | POA: Diagnosis not present

## 2016-08-03 DIAGNOSIS — I712 Thoracic aortic aneurysm, without rupture: Secondary | ICD-10-CM | POA: Diagnosis not present

## 2016-08-03 DIAGNOSIS — Q231 Congenital insufficiency of aortic valve: Secondary | ICD-10-CM | POA: Diagnosis not present

## 2016-08-03 DIAGNOSIS — I480 Paroxysmal atrial fibrillation: Secondary | ICD-10-CM | POA: Diagnosis not present

## 2016-08-03 DIAGNOSIS — E876 Hypokalemia: Secondary | ICD-10-CM

## 2016-08-03 LAB — CBC
HCT: 39.8 % (ref 39.0–52.0)
Hemoglobin: 13.9 g/dL (ref 13.0–17.0)
MCH: 31.8 pg (ref 26.0–34.0)
MCHC: 34.9 g/dL (ref 30.0–36.0)
MCV: 91.1 fL (ref 78.0–100.0)
Platelets: 196 10*3/uL (ref 150–400)
RBC: 4.37 MIL/uL (ref 4.22–5.81)
RDW: 12.5 % (ref 11.5–15.5)
WBC: 4.1 10*3/uL (ref 4.0–10.5)

## 2016-08-03 LAB — BASIC METABOLIC PANEL
Anion gap: 7 (ref 5–15)
BUN: 10 mg/dL (ref 6–20)
CO2: 26 mmol/L (ref 22–32)
Calcium: 8.8 mg/dL — ABNORMAL LOW (ref 8.9–10.3)
Chloride: 105 mmol/L (ref 101–111)
Creatinine, Ser: 0.81 mg/dL (ref 0.61–1.24)
GFR calc Af Amer: >60 mL/min (ref >60)
GFR calc non Af Amer: >60 mL/min (ref >60)
Glucose, Bld: 105 mg/dL — ABNORMAL HIGH (ref 65–99)
Potassium: 3.2 mmol/L — ABNORMAL LOW (ref 3.5–5.1)
Sodium: 138 mmol/L (ref 135–145)

## 2016-08-03 LAB — LIPID PANEL
Cholesterol: 123 mg/dL (ref 0–200)
HDL: 24 mg/dL — ABNORMAL LOW (ref >40)
LDL Cholesterol: 71 mg/dL (ref 0–99)
Total CHOL/HDL Ratio: 5.1 RATIO
Triglycerides: 142 mg/dL (ref <150)
VLDL: 28 mg/dL (ref 0–40)

## 2016-08-03 LAB — HEMOGLOBIN A1C
Hgb A1c MFr Bld: 5.7 % — ABNORMAL HIGH (ref 4.8–5.6)
Mean Plasma Glucose: 117 mg/dL

## 2016-08-03 MED ORDER — APIXABAN 5 MG PO TABS: 5.0000 mg | ORAL_TABLET | Freq: Two times a day (BID) | ORAL | 3 refills | Status: DC

## 2016-08-03 MED ORDER — DILTIAZEM HCL ER COATED BEADS 180 MG PO CP24: 180.0000 mg | ORAL_CAPSULE | Freq: Every day | ORAL | 6 refills | Status: DC

## 2016-08-03 MED ORDER — MAGNESIUM SULFATE 2 GM/50ML IV SOLN
2.0000 g | Freq: Once | INTRAVENOUS | Status: AC
  Administered 2016-08-03: 2 g via INTRAVENOUS
  Filled 2016-08-03: qty 50

## 2016-08-03 MED ORDER — POTASSIUM CHLORIDE CRYS ER 20 MEQ PO TBCR
40.0000 meq | EXTENDED_RELEASE_TABLET | Freq: Once | ORAL | Status: AC
  Administered 2016-08-03: 40 meq via ORAL
  Filled 2016-08-03: qty 2

## 2016-08-03 MED ORDER — POTASSIUM CHLORIDE ER 20 MEQ PO TBCR: EXTENDED_RELEASE_TABLET | ORAL | 6 refills | Status: DC

## 2016-08-03 MED ORDER — DILTIAZEM HCL ER COATED BEADS 180 MG PO CP24
180.0000 mg | ORAL_CAPSULE | Freq: Every day | ORAL | Status: DC
  Administered 2016-08-03: 180 mg via ORAL
  Filled 2016-08-03: qty 1

## 2016-08-03 NOTE — Progress Notes (Signed)
Insurance check completed for Eliquis S/W DENISE @ [employer] # [phone]   ELIQUIS  5 MG BID 30 / 60 TAB   COVER- YES  CO-PAY- $ 44.00  TIER- 3 DRUG  PRIOR APPROVAL- NO  PHARMACY : [facility]

## 2016-08-03 NOTE — Progress Notes (Signed)
Patient ID: [name] [name], male   DOB: 1941/05/20, 75 y.o.   MRN: [id number]   SUBJECTIVE: Patient remains in atrial fibrillation today, rate now in 80s.  No complaints this morning.    Scheduled Meds: . apixaban  5 mg Oral BID  . cholecalciferol  1,000 Units Oral Daily  . diltiazem  180 mg Oral Daily  . hydrochlorothiazide  12.5 mg Oral Daily  . magnesium sulfate 1 - 4 g bolus IVPB  2 g Intravenous Once  . metoprolol succinate  50 mg Oral BID  . potassium chloride  40 mEq Oral Once   Continuous Infusions: PRN Meds:.acetaminophen, baclofen, ondansetron (ZOFRAN) IV    Vitals:   08/02/16 1630 08/02/16 1700 08/02/16 1944 08/03/16 0439  BP: 129/85 (!) 141/90 128/83 116/69  Pulse: 83 77 (!) 58 76  Resp: 16 18 20 20   Temp:  97.8 F (36.6 C) 97.6 F (36.4 C) 98.2 F (36.8 C)  TempSrc:  Oral Oral Oral  SpO2: 96% 99% 99% 96%  Weight:  195 lb (88.5 kg)    Height:  5\' 11"  (1.803 m)      Intake/Output Summary (Last 24 hours) at 08/03/16 0829 Last data filed at 08/03/16 0634  Gross per 24 hour  Intake              200 ml  Output              775 ml  Net             -575 ml    LABS: Basic Metabolic Panel:  Recent Labs  08/02/16 1121 08/03/16 0159  NA 139 138  K 3.9 3.2*  CL 105 105  CO2 26 26  GLUCOSE 107* 105*  BUN 13 10  CREATININE 1.01 0.81  CALCIUM 9.3 8.8*   Liver Function Tests: No results for input(s): AST, ALT, ALKPHOS, BILITOT, PROT, ALBUMIN in the last 72 hours. No results for input(s): LIPASE, AMYLASE in the last 72 hours. CBC:  Recent Labs  08/02/16 1121 08/03/16 0159  WBC 4.3 4.1  HGB 14.5 13.9  HCT 42.1 39.8  MCV 91.5 91.1  PLT 193 196   Cardiac Enzymes: No results for input(s): CKTOTAL, CKMB, CKMBINDEX, TROPONINI in the last 72 hours. BNP: Invalid input(s): POCBNP D-Dimer: No results for input(s): DDIMER in the last 72 hours. Hemoglobin A1C:  Recent Labs  08/02/16 1808  HGBA1C 5.7*   Fasting Lipid Panel:  Recent Labs   08/03/16 0159  CHOL 123  HDL 24*  LDLCALC 71  TRIG 142  CHOLHDL 5.1   Thyroid Function Tests:  Recent Labs  08/02/16 1808  TSH 3.387   Anemia Panel: No results for input(s): VITAMINB12, FOLATE, FERRITIN, TIBC, IRON, RETICCTPCT in the last 72 hours.  RADIOLOGY: Dg Chest Port 1 View  Result Date: 08/02/2016 CLINICAL DATA:  Central chest pain for 3 days. EXAM: PORTABLE CHEST 1 VIEW COMPARISON:  Five hundred thirteen 1,014 FINDINGS: Increased prominence of the interstitial lung markings raise concern for mild edema. Heart size is upper limits of normal but stable. Patient appears to be mildly rotated towards the left side. Negative for a pneumothorax. No acute bone abnormality. Degenerative facet disease in lower cervical spine. IMPRESSION: Prominent interstitial lung markings raise concern for interstitial edema. No focal airspace disease. Electronically Signed   By: Markus Daft M.D.   On: 08/02/2016 12:05    PHYSICAL EXAM General: NAD Neck: No JVD, no thyromegaly or thyroid nodule.  Lungs: Clear to auscultation bilaterally  with normal respiratory effort. CV: Nondisplaced PMI.  Heart irregular S1/S2, no S3/S4, no murmur.  No peripheral edema.  No carotid bruit.  Normal pedal pulses.  Abdomen: Soft, nontender, no hepatosplenomegaly, no distention.  Neurologic: Alert and oriented x 3.  Psych: Normal affect. Extremities: No clubbing or cyanosis.   TELEMETRY: Reviewed telemetry pt in atrial fibrillation, HR 80s  ASSESSMENT AND PLAN: 75 y.o. [place] male with history of paroxysmal atrial fibrillation (pt refuses chronic anticoagulation), bicuspid aortic valve, moderate AS, 86mm ascending aortic aneurysm, HTN, HLD, osteoarthritis who presented back to the ED with symptomatic atrial fib.  1. Paroxysmal atrial fib: Intermittent recurrence, has been in this for at least 4 days. He has been very resistant to anticoagulation.  We discussed again the risk of stroke if he remains off  anticoagulation. He agrees to be on anticoagulation at least for the short term leading up to TEE/DCCV and for at least 1 month afterwards.  CHADSVASC 3 for HTN, age. His lab and CXR suggest mild CHF but clinically he currently does not have significant volume overload.  He remains in rate-controlled atrial fibrillation today on diltiazem gtt.  - Stop diltiazem gtt, start diltiazem CD 180 mg daily.  - Continue Toprol XL 50 mg bid. - Continue Eliquis 5 mg bid, has had 1 dose so far.  - Will plan to let him go home today if rate remains controlled on diltiazem CD.  Will have him return next week for TEE-guided DCCV (will schedule).  - Will need to consider anti-arrhythmic after DCCV, may try Multaq (versus flecainide) if he will agree.  - Mentioned Watchman to him, will need further discussion.  2. Bicuspid aortic valve: Moderate aortic stenosis/mild aortic insufficiency 12/2015 echo.  3. Ascending aortic aneurysm, 4mm by echo 12/2015: Needs MRA chest scheduled as outpatient.  4. HTN: controlled. 5. Recent back injury: no obvious trauma by exam. No recent gait issues or high risk sx. Patient has tried to remain physically active. Continue PRN rx for muscle spasms. 6. Disposition: If HR remains controlled on po meds, can go home today.  Needs TEE-guided DCCV scheduled for next week with me, then followup with me as scheduled in December.  Would also schedule outpatient MRA chest to follow aortic aneurysm.  Cardiac meds for home: Eliquis 5 mg bid, Toprol XL 50 mg bid, HCTZ 12.5 daily, KCl 20 daily, Diltiazem CD 180 daily.   [name] 08/03/2016 8:38 AM

## 2016-08-03 NOTE — Care Management Note (Signed)
Case Management Note Marvetta Gibbons RN, BSN Unit 2W-Case Manager [phone]  Patient Details  Name: Maor Seymour MRN: [id number] Date of Birth: Dec 23, 1940  Subjective/Objective:   Pt admitted with afib                Action/Plan: PTA pt lived at [facility] received for Eliquis- per benefits check-S/W DENISE @ [employer] # [phone]   ELIQUIS  5 MG BID 30 / 60 TAB   COVER- YES  CO-PAY- $ 44.00  TIER- 3 DRUG  PRIOR APPROVAL- NO  PHARM[facility]Y : [facility]   Spoke with pt at bedside- coverage info shared- 30 day free card given to pt to use on discharge.   Expected Discharge Date:    08/03/16              Expected Discharge Plan:  Home/Self Care  In-House Referral:     Discharge planning Services  CM Consult, Medication Assistance  Post Acute Care Choice:    Choice offered to:     DME Arranged:    DME Agency:     HH Arranged:    HH Agency:     Status of Service:  Completed, signed off  If discussed at [employer] of Stay Meetings, dates discussed:    Additional Comments:  Dawayne Patricia, RN 08/03/2016, 11:53 AM

## 2016-08-03 NOTE — Discharge Summary (Signed)
Discharge Summary    Patient ID: [name] [name],  MRN: [id number], DOB/AGE: 1941/04/03 75 y.o.  Admit date: 08/02/2016 Discharge date: 08/03/2016  Primary Care Provider: [name] Primary Cardiologist: Aundra Dubin  Discharge Diagnoses    Principal Problem:   PAF (paroxysmal atrial fibrillation) ([place]) Active Problems:   Essential hypertension   Atrial fibrillation with RVR (HCC)   Ascending aortic aneurysm (HCC)   Bicuspid aortic valve   Moderate aortic stenosis   Mild aortic insufficiency   Back injury    Diagnostic Studies/Procedures    N/A _____________     History of Present Illness     [name] [name] is a 75 y.o. male with history of paroxysmal atrial fibrillation (pt previously refused chronic anticoagulation), bicuspid aortic valve, moderate AS, 52mm ascending aortic aneurysm, HTN, HLD, osteoarthritis who presented back to the ED 08/02/16 (yesterday) with symptomatic atrial fib.    Hospital Course    Per Dr. Claris Gladden note, atrial fib was diagnosed 07/2008. The patient underwent ibutilide cardioversion successfully. He has had 1-2 episodes/year that are short-lived that likely are atrial fibrillation with RVR. He was admitted in 5/14 with atrial fibrillation/RVR and had TEE-guided DCCV. He was on Xarelto for anticoagulation but stopped it as he was convinced it was causing joint pains. At appointment in 5/17, he had been back in atrial fibrillation for several days and was symptomatic. The patient was agreeable to short-term Eliquis and TEE/DCCV was planned, but the patient converted to NSR on his own. He continued Eliquis for about 1 month then stopped it on his own, despite education regarding risk of stroke. 2D Echo 12/2015: mild focal basal hypertrophy of septum, EF 65-70%, no RWMA, moderate AS, mild AI, mildly dilated ascending aorta 28mm, mild MR, mild PASP 78mmHg. ETT-myoview (12/11) with 10:51 exercise, no chest pain, no significant ST changes, EF 69%, no evidence  for ischemia or infarction.   Mr. Stensrud has remained very active and exercises regularly without chest pain or SOB. About a month however he had an accident where one of the rods came loose on his exercise equipment and he somehow got hit in the back. He saw primary care on 07/02/16 who evaluated him and recommended Advil and muscle relaxers. He feels that his injury eventually led to recurrence of atrial fib, which it sounds like he first noticed on Friday 07/30/16. His baseline HR is 55-60 but on Friday he noticed it was 111. He felt somewhat funny in his chest with dizziness similar to prior episodes of atrial fib. In the past he's converted to NSR on his own after several days but over the weekend his HR persisted in the 85-95 range. Yesterday it was still >100 so he came to the ED where he was noted to be in atrial fib RVR. He denied any CP or SOB. Labs notable for BNP 372, trop neg, K 3.9, Cr 1.01, Hgb 14.5, glucose 107, CXR w/ concern for interstitial edema. No LEE, orthopnea, syncope, bleeding. He was admitted for rate control and placed on IV diltiazem. Although BNP and CXR with above findings, he did not have any evidence of volume overload. When asked about anticoagulation, he initially refused (see H/P) then stated he would be willing to resume this short-term and for at least 1 month after so that he could proceed with TEE/DCCV. Risks, benefits, alternatives were reviewed with the patient who wishes to proceed. He was started back on oral Eliquis. Of note with regard to recent back injury, there was no obvious trauma  by exam and he had had no recent gait issues or high risk sx.   This morning his HR was improved so he was changed to oral diltiazem. He was continued on oral metoprolol. He was also started on potassium for hypokalemia. His HR is 70s-80s at rest. It does go up to the 130s-140s with ambulation but he is tolerating this without difficulty. TEE/DCCV has been arranged for 08/10/16 at 11am  (patient instructed to arrive at 9:45). Dr. Aundra Dubin would like him to take all his usual medicines that day - the importance of strict compliance with blood thinner was reinforced. His ASA will remain held while he is on Eliquis. Dr. Aundra Dubin has seen and examined the patient today and feels he is stable for discharge. Will keep f/u with Dr. Aundra Dubin as scheduled 08/27/16 at 1:45pm. May need to consider antiarrhythmic therapy in follow-up (Multaq versus flecainide if he will agree) versus referral to EP to consider Watchman. Dr. Aundra Dubin also recommends outpatient MRA of the chest to follow aortic aneurysm. I sent a message to his nurse to help arrange. _____________  Discharge Vitals Blood pressure 133/74, pulse 83, temperature 98.2 F (36.8 C), temperature source Oral, resp. rate 16, height 5\' 11"  (1.803 m), weight 195 lb (88.5 kg), SpO2 99 %.  Filed Weights   08/02/16 1700  Weight: 195 lb (88.5 kg)    Labs & Radiologic Studies    CBC  Recent Labs  08/02/16 1121 08/03/16 0159  WBC 4.3 4.1  HGB 14.5 13.9  HCT 42.1 39.8  MCV 91.5 91.1  PLT 193 [id number]   Basic Metabolic Panel  Recent Labs  08/02/16 1121 08/03/16 0159  NA 139 138  K 3.9 3.2*  CL 105 105  CO2 26 26  GLUCOSE 107* 105*  BUN 13 10  CREATININE 1.01 0.81  CALCIUM 9.3 8.8*   Hemoglobin A1C  Recent Labs  08/02/16 1808  HGBA1C 5.7*   Fasting Lipid Panel  Recent Labs  08/03/16 0159  CHOL 123  HDL 24*  LDLCALC 71  TRIG 142  CHOLHDL 5.1   Thyroid Function Tests  Recent Labs  08/02/16 1808  TSH 3.387   _____________  Dg Chest Port 1 View  Result Date: 08/02/2016 CLINICAL DATA:  Central chest pain for 3 days. EXAM: PORTABLE CHEST 1 VIEW COMPARISON:  Five hundred thirteen 1,014 FINDINGS: Increased prominence of the interstitial lung markings raise concern for mild edema. Heart size is upper limits of normal but stable. Patient appears to be mildly rotated towards the left side. Negative for a pneumothorax. No  acute bone abnormality. Degenerative facet disease in lower cervical spine. IMPRESSION: Prominent interstitial lung markings raise concern for interstitial edema. No focal airspace disease. Electronically Signed   By: Markus Daft M.D.   On: 08/02/2016 12:05   Disposition   Pt is being discharged home today in good condition.  Follow-up Plans & Appointments    Follow-up Information    [facility] Follow up.   Why:  [facility] on 08/10/16 by 9:45am to check in for your TEE/cardioversion. Nothing to eat or drink after midnight the night before the procedure EXCEPT a sip of water with your medicines that morning (make sure to take your Eliquis). Contact information: [place] [id number]       [name], MD Follow up.   Specialty:  Cardiology Why:  Keep follow-up appointment with Dr. Aundra Dubin on 12/15 at 1:45pm. His nurse will  also be calling you to schedule follow-up MRA of your chest to follow up your aneurysm. Contact information: [id number] [address] [phone]          Discharge Instructions    Diet - low sodium heart healthy    Complete by:  As directed    Increase activity slowly    Complete by:  As directed    Your aspirin was stopped since you are on Eliquis. New medicines listed above - Eliquis, Diltiazem, and Potassium.  IT IS EXTREMELY IMPORTANT THAT YOU DO NOT MISS A SINGLE DOSE OF ELIQUIS BEFORE AND AFTER YOUR CARDIOVERSION. IF YOU DO MISS A DOSE, PLEASE CALL SO WE CAN RESCHEDULE YOUR PROCEDURE.      Discharge Medications     Medication List    STOP taking these medications   aspirin EC 81 MG tablet     TAKE these medications   apixaban 5 MG Tabs tablet Commonly known as:  ELIQUIS Take 1 tablet (5 mg total) by mouth 2 (two) times daily.   Cholecalciferol 1000 units capsule Take 1,000 Units by mouth daily.   diltiazem 180 MG 24 hr  capsule Commonly known as:  CARDIZEM CD Take 1 capsule (180 mg total) by mouth daily. Start taking on:  08/04/2016   hydrochlorothiazide 12.5 MG capsule Commonly known as:  MICROZIDE Take 1 capsule (12.5 mg total) by mouth daily.   metoprolol succinate 50 MG 24 hr tablet Commonly known as:  TOPROL-XL Take 1 tablet (50 mg total) by mouth 2 (two) times daily. Take with or immediately following a meal.   Potassium Chloride ER 20 MEQ Tbcr Take 1 tablet (20 mEq) by mouth daily.        Allergies:  Allergies  Allergen Reactions  . Xarelto [Rivaroxaban] Other (See Comments)    INCREASED BP-HYPERTENSIVE EVENTS  . Benazepril Cough    Outstanding Labs/Studies   Will need MRA of chest - to be arranged.  Duration of Discharge Encounter   Greater than 30 minutes including physician time.  Signed, Nedra Hai Shontay Wallner PA-C 08/03/2016, 5:00 PM

## 2016-08-03 NOTE — Progress Notes (Signed)
Pt has been discharged home with wife. IV and telemetry box removed. Pt received discharge instructions and all questions were answered. Pt received 30 day free card and prescription for eliquis. Pt left with all of his belongings. Pt ambulated off the unit with his wife. Pt was in no distress at time of discharge.  Grant Fontana BSN, RN

## 2016-08-03 NOTE — Care Management Obs Status (Signed)
[facility] NOTIFICATION   Patient Details  Name: [name] [name] MRN: [id number] Date of Birth: 1941-04-14   Medicare Observation Status Notification Given:  Yes    Dawayne Patricia, RN 08/03/2016, 11:53 AM

## 2016-08-04 ENCOUNTER — Telehealth: Payer: Self-pay

## 2016-08-04 ENCOUNTER — Telehealth: Payer: Self-pay | Admitting: *Deleted

## 2016-08-04 DIAGNOSIS — I712 Thoracic aortic aneurysm, without rupture, unspecified: Secondary | ICD-10-CM

## 2016-08-04 NOTE — Telephone Encounter (Signed)
-----   Message from Charlie Pitter, [place] sent at 08/03/2016  4:36 PM EST ----- Regarding: MRA  Hi Anne, Dr. Aundra Dubin saw this patient as an inpatient and recommended he have f/u MRA CHEST WITH OR WITHOUT CONTRAST scheduled to follow up his aortic aneurysm. Can you please arrange? His other follow-ups including appt and TEE/DCCV are already scheduled.  Thanks for your help! Dayna

## 2016-08-04 NOTE — Telephone Encounter (Signed)
Pt on TCM 08/04/2016.  Pt dischargwe summary advised cardiac follow with cards appt made

## 2016-08-09 ENCOUNTER — Telehealth: Payer: Self-pay | Admitting: Cardiology

## 2016-08-09 DIAGNOSIS — I480 Paroxysmal atrial fibrillation: Secondary | ICD-10-CM

## 2016-08-09 NOTE — Telephone Encounter (Signed)
Line busy

## 2016-08-09 NOTE — Telephone Encounter (Signed)
I spoke to patient. He states his heart is back in rhythm and he no longer requires tomorrow's procedure (TEE/Cardioversion). He asked that I cancel it. I spoke to Diane in scheduling and cancelled the procedure.

## 2016-08-09 NOTE — Telephone Encounter (Signed)
New Message:    Pt says he is supposed to have a procedure tomorrow,he says he does not need it. Please call asap,so this can be cancelled.

## 2016-08-09 NOTE — Telephone Encounter (Signed)
Ask him to come by for ECG to confirm.

## 2016-08-09 NOTE — Telephone Encounter (Signed)
Needs EKG to document NSR

## 2016-08-10 ENCOUNTER — Ambulatory Visit (INDEPENDENT_AMBULATORY_CARE_PROVIDER_SITE_OTHER): Payer: Medicare Other

## 2016-08-10 VITALS — BP 132/80 | Wt 195.8 lb

## 2016-08-10 DIAGNOSIS — I480 Paroxysmal atrial fibrillation: Secondary | ICD-10-CM | POA: Diagnosis not present

## 2016-08-10 SURGERY — ECHOCARDIOGRAM, TRANSESOPHAGEAL
Anesthesia: Moderate Sedation

## 2016-08-10 NOTE — Telephone Encounter (Signed)
Pt came into office today for ECG-please review note.

## 2016-08-10 NOTE — Telephone Encounter (Signed)
I spoke to patient.  I advised to come in today at 1130 for EKG. He voiced understanding and agreed with plan.

## 2016-08-10 NOTE — Telephone Encounter (Signed)
This appears to be Dr. Oleh Genin patient

## 2016-08-10 NOTE — Telephone Encounter (Signed)
FYI

## 2016-08-10 NOTE — Telephone Encounter (Signed)
LMTCB

## 2016-08-11 NOTE — Telephone Encounter (Signed)
He is back in NSR.

## 2016-08-12 ENCOUNTER — Ambulatory Visit (HOSPITAL_COMMUNITY)
Admission: RE | Admit: 2016-08-12 | Discharge: 2016-08-12 | Disposition: A | Discharge status: Home / Self Care | Payer: Medicare Other | Source: Ambulatory Visit | Attending: Cardiology | Admitting: Cardiology

## 2016-08-12 DIAGNOSIS — I712 Thoracic aortic aneurysm, without rupture, unspecified: Secondary | ICD-10-CM

## 2016-08-12 MED ORDER — GADOBENATE DIMEGLUMINE 529 MG/ML IV SOLN
20.0000 mL | Freq: Once | INTRAVENOUS | Status: AC
  Administered 2016-08-12: 19 mL via INTRAVENOUS

## 2016-08-20 ENCOUNTER — Ambulatory Visit: Payer: Medicare Other | Admitting: Physician Assistant

## 2016-08-27 ENCOUNTER — Ambulatory Visit (INDEPENDENT_AMBULATORY_CARE_PROVIDER_SITE_OTHER): Payer: Medicare Other | Admitting: Cardiology

## 2016-08-27 ENCOUNTER — Encounter: Payer: Self-pay | Admitting: Cardiology

## 2016-08-27 VITALS — BP 160/80 | HR 86 | Ht 71.0 in | Wt 201.0 lb

## 2016-08-27 DIAGNOSIS — I359 Nonrheumatic aortic valve disorder, unspecified: Secondary | ICD-10-CM | POA: Diagnosis not present

## 2016-08-27 DIAGNOSIS — I10 Essential (primary) hypertension: Secondary | ICD-10-CM

## 2016-08-27 DIAGNOSIS — I480 Paroxysmal atrial fibrillation: Secondary | ICD-10-CM

## 2016-08-27 LAB — BASIC METABOLIC PANEL
BUN: 11 mg/dL (ref 7–25)
CO2: 29 mmol/L (ref 20–31)
Calcium: 8.8 mg/dL (ref 8.6–10.3)
Chloride: 103 mmol/L (ref 98–110)
Creat: 0.8 mg/dL (ref 0.70–1.18)
Glucose, Bld: 105 mg/dL — ABNORMAL HIGH (ref 65–99)
Potassium: 3.6 mmol/L (ref 3.5–5.3)
Sodium: 139 mmol/L (ref 135–146)

## 2016-08-27 LAB — CBC WITH DIFFERENTIAL/PLATELET
Basophils Absolute: 0 cells/uL (ref 0–200)
Basophils Relative: 0 %
Eosinophils Absolute: 100 cells/uL (ref 15–500)
Eosinophils Relative: 2 %
HCT: 38.3 % — ABNORMAL LOW (ref 38.5–50.0)
Hemoglobin: 12.9 g/dL — ABNORMAL LOW (ref 13.2–17.1)
Lymphocytes Relative: 28 %
Lymphs Abs: 1400 cells/uL (ref 850–3900)
MCH: 31.5 pg (ref 27.0–33.0)
MCHC: 33.7 g/dL (ref 32.0–36.0)
MCV: 93.6 fL (ref 80.0–100.0)
MPV: 10.1 fL (ref 7.5–12.5)
Monocytes Absolute: 650 cells/uL (ref 200–950)
Monocytes Relative: 13 %
Neutro Abs: 2850 cells/uL (ref 1500–7800)
Neutrophils Relative %: 57 %
Platelets: 161 10*3/uL (ref 140–400)
RBC: 4.09 MIL/uL — ABNORMAL LOW (ref 4.20–5.80)
RDW: 13.4 % (ref 11.0–15.0)
WBC: 5 10*3/uL (ref 3.8–10.8)

## 2016-08-27 MED ORDER — RAMIPRIL 5 MG PO CAPS: 5.0000 mg | ORAL_CAPSULE | Freq: Every day | ORAL | 1 refills | Status: DC

## 2016-08-27 NOTE — Patient Instructions (Signed)
Medication Instructions:  Start ramipril 5mg  daily  Labwork: BMET/CBCd today.  Your physician recommends that you return for lab work in: 2 weeks--BMET. This is scheduled for Friday December 29,2017. The lab is open from 7:30AM-5PM.   Testing/Procedures: none  Follow-Up: Your physician recommends that you schedule a follow-up appointment in: 3 months with Dr Aundra Dubin in the [facility] at [facility]. [phone]    Any Other Special Instructions Will Be Listed Below (If Applicable).  Your physician has requested that you regularly monitor and record your blood pressure readings at home. Please use the same machine at the same time of day to check your readings and record. We will call you in about 2 weeks to get the readings.     If you need a refill on your cardiac medications before your next appointment, please call your pharmacy.

## 2016-08-29 NOTE — Progress Notes (Signed)
Patient ID: [name] [name], male   DOB: 1941/06/11, 75 y.o.   MRN: [id number] PCP: Dr. Alain Marion  75 yo with history of HTN and paroxysmal atrial fibrillation returns for followup. He was hospitalized with atrial fibrillation/RVR in 5/14.  He had TEE-guided cardioversion.  TEE showed bicuspid aortic valve with mild AS and a moderately dilated ascending aorta.  Most recent echo in 4/17 showed moderate aortic stenosis and MRA chest in 11/17 showed 4.1 cm ascending aorta.   He was on Xarelto for anticoagulation but stopped it as he was convinced it was causing joint pains.  He then refused to start any other anticoagulation.    In 5/17, he had been back in atrial fibrillation for several days and was symptomatic.  I started him on Eliquis and planned TEE-guided DCCV given significant symptoms, but he converted back to NSR on his own.  He continued Eliquis for about 1 month then stopped it on his own.    In 11/17, he was hospitalized with symptomatic atrial fibrillation with RVR.  I started him on diltiazem CD and Eliquis with plan for TEE-guided DCCV.  However, he converted back to NSR on his own. He stopped the diltiazem but has continued the Eliquis.   Today, he is in NSR.  He has not felt palpitations.  No chest pain, no exertional dyspnea.  He feels back to normal.  He still has back pain from where he injured his back on a piece of exercise equipment.  BP is running high.   Labs (5/14): K 3.7, creatinine 0.9, BNP 2261=>109, TSH normal Labs (7/14): K 3.5, creatinine 1.0 Labs (12/14): K 3.8, creatinine 1.0, LDL particle number 1374, LDL 107, TSH normal Labs (6/16): TSH normal, K 3.8, creatinine 0.84, HCT 42.4, LDL 85, LFTs normal Labs (3/17): K 3.8, creatinine 0.87 Labs (5/17): K 4.5, creatinine 0.97, HCT 43.7 Labs (11/17): K 3.2, creatinine 0.81, LDL 71, HDL 24  ECG: NSR, nonspecific T wave flattening  Allergies (verified):  No Known Drug Allergies   Past Medical History:  1. Hypertension:  ACEI cough.  2. Atrial fibrillation. The patient had new-onset atrial fibrillation in November 2009. He underwent ibutilide cardioversion successfully. He has had 1-2 episodes/year that are short-lived that likely are atrial fibrillation with RVR. He was admitted in 5/14 with atrial fibrillation/RVR and had TEE-guided DCCV.  Atrial fibrillation again in 5/17, converted back to NSR spontaneously.  CHADSVASC score 2.  3. Hypercholesterolemia.  4. Bicuspid aortic valve disorder: Echo (7/11): EF 55-60%, mild LV hypertrophy, mild aortic stenosis with mean gradient 19 mmHg and peak gradient 36 mmHg.  TEE (5/14): EF 55%, mild LVH, bicuspid aortic valve with mild AS (mean gradient 13 mmHg), ascending aorta 4.5 cm.  Echo (9/15) with EF 65-70%, moderate AS (mean gradient 23 mmHg), mild AI, ascending aorta 4.1 cm, mild MR.   - Echo (4/17) with EF 65-70%, moderate aortic stenosis with mean gradient 27 mmHg, PASP 31 mmHg, ascending aorta 4.4 cm.  5. Osteoarthritis.  6. Low back pain.  7. Chest pain: ETT-myoview (12/11) with 10:51 exercise, no chest pain, no significant ST changes, EF 69%, no evidence for ischemia or infarction.  8. Ascending aortic aneurysm: Associated with bicuspid aortic valve.  4.5 cm by TEE in 5/14.   MRA chest (6/14) with bicuspid aortic valve, 4.3 cm ascending aortic aneurysm.  MRA chest (10/15) with 4.2 cm ascending aorta (bicuspid aortic valve noted).  Echo (4/17) with ascending aorta diameter 4.4 cm.  - MRA chest (11/17) with 4.1  cm ascending aorta.   Family History:  Hypertension. No early CAD.   Social History:  The patient lives in [place]. He is a native of [place]. He is retired Chief Financial Officer at [employer]. He drinks a glass of wine nightly. He is a nonsmoker. He is a former Microbiologist.   Review of Systems  All systems reviewed and negative except as per HPI.   Current Outpatient Prescriptions  Medication Sig Dispense Refill  . apixaban (ELIQUIS) 5 MG TABS  tablet Take 1 tablet (5 mg total) by mouth 2 (two) times daily. 60 tablet 3  . Cholecalciferol 1000 UNITS capsule Take 1,000 Units by mouth daily.    . metoprolol succinate (TOPROL-XL) 50 MG 24 hr tablet Take 1 tablet (50 mg total) by mouth 2 (two) times daily. Take with or immediately following a meal. 180 tablet 3  . hydrochlorothiazide (HYDRODIURIL) 25 MG tablet Take 1 tablet (25 mg total) by mouth daily.    . ramipril (ALTACE) 5 MG capsule Take 1 capsule (5 mg total) by mouth daily. 90 capsule 1   No current facility-administered medications for this visit.     BP (!) 160/80   Pulse 86   Ht 5\' 11"  (1.803 m)   Wt 201 lb (91.2 kg)   SpO2 97%   BMI 28.03 kg/m  General: NAD Neck: No JVD, no thyromegaly or thyroid nodule.  Lungs: Clear to auscultation bilaterally with normal respiratory effort. CV: Nondisplaced PMI.  Heart regular S1/S2, no S3/S4, 3/6 SEM RUSB with clear S2.  No peripheral edema.  No carotid bruit.  Normal pedal pulses.  Abdomen: Soft, nontender, no hepatosplenomegaly, no distention.  Neurologic: Alert and oriented x 3.  Psych: Normal affect. Extremities: No clubbing or cyanosis.   Assessment/Plan: 1. Atrial fibrillation: Paroxysmal. He is symptomatic when in atrial fibrillation.  Episodes have been fairly rare, but he has had 2 this year.  CHADSVASC = 3.    - In the past, he has refused to stay on an anticoagulant long-term.  However, he so far is willing to continue apixaban.  We discussed the lowering of CVA risk with NOAC use. CBC and BMET today.  - He would likely be a candidate for flecainide chronically or as pill in the pocket, but he does not want an additional med.  We discussed atrial fibrillation ablation as he is quite symptomatic when in fibrillation, but he is not yet interested in this.  For now, will focus on keeping him anticoagulated and getting BP under control.  - Continue Toprol XL.  2. HTN: BP elevated, start ramipril 5 mg daily.   We discussed how  it was important to control BP to decrease risk of atrial fibrillation recurrence.  3. Bicuspid aortic valve disorder: Moderate AS on 4/17 echo, stable.  Echo showed ascending aorta 4.4 cm but MRA chest showed maximal ascending aorta dimension 4.1 cm.  Repeat echo in 4/18 and MRA chest in 11/18.    Followup with me in 3 months in Heart and Vascular clinic.     [name] 08/29/2016

## 2016-08-30 DIAGNOSIS — M5416 Radiculopathy, lumbar region: Secondary | ICD-10-CM | POA: Diagnosis not present

## 2016-08-30 DIAGNOSIS — S22080A Wedge compression fracture of T11-T12 vertebra, initial encounter for closed fracture: Secondary | ICD-10-CM | POA: Diagnosis not present

## 2016-08-30 DIAGNOSIS — Z6828 Body mass index (BMI) 28.0-28.9, adult: Secondary | ICD-10-CM | POA: Diagnosis not present

## 2016-08-30 DIAGNOSIS — I10 Essential (primary) hypertension: Secondary | ICD-10-CM | POA: Diagnosis not present

## 2016-08-30 DIAGNOSIS — M545 Low back pain: Secondary | ICD-10-CM | POA: Diagnosis not present

## 2016-09-01 ENCOUNTER — Telehealth: Payer: Self-pay | Admitting: *Deleted

## 2016-09-01 NOTE — Telephone Encounter (Signed)
Copied from Dr Claris Gladden 08/27/16 office note:  HTN: BP elevated, start ramipril 5 mg daily.   We discussed how it was important to control BP to decrease risk of atrial fibrillation recurrence.    09/01/16: Please call pt around 09/10/16 and see how his BP has been, then forward information to Dr Aundra Dubin.

## 2016-09-03 DIAGNOSIS — S22080A Wedge compression fracture of T11-T12 vertebra, initial encounter for closed fracture: Secondary | ICD-10-CM | POA: Diagnosis not present

## 2016-09-09 DIAGNOSIS — S3992XA Unspecified injury of lower back, initial encounter: Secondary | ICD-10-CM | POA: Diagnosis not present

## 2016-09-09 DIAGNOSIS — M545 Low back pain: Secondary | ICD-10-CM | POA: Diagnosis not present

## 2016-09-09 DIAGNOSIS — S22080A Wedge compression fracture of T11-T12 vertebra, initial encounter for closed fracture: Secondary | ICD-10-CM | POA: Diagnosis not present

## 2016-09-09 DIAGNOSIS — M5416 Radiculopathy, lumbar region: Secondary | ICD-10-CM | POA: Diagnosis not present

## 2016-09-10 ENCOUNTER — Other Ambulatory Visit: Payer: Medicare Other | Admitting: *Deleted

## 2016-09-10 ENCOUNTER — Telehealth: Payer: Self-pay

## 2016-09-10 DIAGNOSIS — I10 Essential (primary) hypertension: Secondary | ICD-10-CM

## 2016-09-10 DIAGNOSIS — I480 Paroxysmal atrial fibrillation: Secondary | ICD-10-CM

## 2016-09-10 DIAGNOSIS — I359 Nonrheumatic aortic valve disorder, unspecified: Secondary | ICD-10-CM

## 2016-09-10 LAB — BASIC METABOLIC PANEL
BUN: 14 mg/dL (ref 7–25)
CO2: 26 mmol/L (ref 20–31)
Calcium: 8.7 mg/dL (ref 8.6–10.3)
Chloride: 102 mmol/L (ref 98–110)
Creat: 0.81 mg/dL (ref 0.70–1.18)
Glucose, Bld: 90 mg/dL (ref 65–99)
Potassium: 4 mmol/L (ref 3.5–5.3)
Sodium: 137 mmol/L (ref 135–146)

## 2016-09-10 NOTE — Telephone Encounter (Signed)
Samples of Eliquis 5 mg 1 box provided to patient.

## 2016-09-10 NOTE — Telephone Encounter (Signed)
Instructed patient to INCREASE RAMIPRIL to 10 mg daily and schedule OV and BMET in a week. Patient refuses to make further medication changes at this time. He states he is in a lot of pain because of his back issues and wants that addressed before changing medications. He is convinced BP increase is due to back pain. He will continue current dose of Ramipril at this time, continue to monitor BP and will call if it gets above 150's. He will also call after he has a surgical plan for his back to revisit BP issues.

## 2016-09-10 NOTE — Telephone Encounter (Signed)
Called patient to see how he has been doing on Ramipril.  Patient stated that his SBP are 147 to 155. Patient stated he has not seen much of a difference and does not think any thing is going to work for him. Informed patient that our office has a BP clinic that Dr. Aundra Dubin might want patient to go to with our pharmacist. Will send to Dr. Aundra Dubin and his nurse for further advisement. Patient verbalized understanding.

## 2016-09-10 NOTE — Telephone Encounter (Signed)
Would increase ramipril on up to 10 mg daily and get BMET next week.  He can followup for a visit in BP clinic.

## 2016-09-22 DIAGNOSIS — S22080A Wedge compression fracture of T11-T12 vertebra, initial encounter for closed fracture: Secondary | ICD-10-CM | POA: Diagnosis not present

## 2016-09-22 DIAGNOSIS — I10 Essential (primary) hypertension: Secondary | ICD-10-CM | POA: Diagnosis not present

## 2016-10-18 ENCOUNTER — Encounter (HOSPITAL_BASED_OUTPATIENT_CLINIC_OR_DEPARTMENT_OTHER): Payer: Self-pay | Admitting: *Deleted

## 2016-10-18 ENCOUNTER — Emergency Department (HOSPITAL_BASED_OUTPATIENT_CLINIC_OR_DEPARTMENT_OTHER): Payer: Medicare Other

## 2016-10-18 ENCOUNTER — Inpatient Hospital Stay (HOSPITAL_BASED_OUTPATIENT_CLINIC_OR_DEPARTMENT_OTHER)
Admission: EM | Admit: 2016-10-18 | Discharge: 2016-10-28 | DRG: 871 | Disposition: A | Discharge status: Home Health | Payer: Medicare Other | Attending: Internal Medicine | Admitting: Internal Medicine

## 2016-10-18 DIAGNOSIS — J918 Pleural effusion in other conditions classified elsewhere: Secondary | ICD-10-CM

## 2016-10-18 DIAGNOSIS — R7989 Other specified abnormal findings of blood chemistry: Secondary | ICD-10-CM

## 2016-10-18 DIAGNOSIS — Z79899 Other long term (current) drug therapy: Secondary | ICD-10-CM | POA: Diagnosis not present

## 2016-10-18 DIAGNOSIS — K5900 Constipation, unspecified: Secondary | ICD-10-CM | POA: Diagnosis not present

## 2016-10-18 DIAGNOSIS — A408 Other streptococcal sepsis: Secondary | ICD-10-CM | POA: Diagnosis not present

## 2016-10-18 DIAGNOSIS — Z9889 Other specified postprocedural states: Secondary | ICD-10-CM

## 2016-10-18 DIAGNOSIS — I471 Supraventricular tachycardia: Secondary | ICD-10-CM | POA: Diagnosis not present

## 2016-10-18 DIAGNOSIS — I5030 Unspecified diastolic (congestive) heart failure: Secondary | ICD-10-CM | POA: Diagnosis present

## 2016-10-18 DIAGNOSIS — J90 Pleural effusion, not elsewhere classified: Secondary | ICD-10-CM | POA: Diagnosis not present

## 2016-10-18 DIAGNOSIS — R918 Other nonspecific abnormal finding of lung field: Secondary | ICD-10-CM | POA: Diagnosis not present

## 2016-10-18 DIAGNOSIS — R05 Cough: Secondary | ICD-10-CM | POA: Diagnosis not present

## 2016-10-18 DIAGNOSIS — R350 Frequency of micturition: Secondary | ICD-10-CM | POA: Diagnosis present

## 2016-10-18 DIAGNOSIS — N179 Acute kidney failure, unspecified: Secondary | ICD-10-CM

## 2016-10-18 DIAGNOSIS — J13 Pneumonia due to Streptococcus pneumoniae: Secondary | ICD-10-CM | POA: Diagnosis not present

## 2016-10-18 DIAGNOSIS — E876 Hypokalemia: Secondary | ICD-10-CM | POA: Diagnosis not present

## 2016-10-18 DIAGNOSIS — I6529 Occlusion and stenosis of unspecified carotid artery: Secondary | ICD-10-CM | POA: Diagnosis present

## 2016-10-18 DIAGNOSIS — I4892 Unspecified atrial flutter: Secondary | ICD-10-CM | POA: Diagnosis present

## 2016-10-18 DIAGNOSIS — Z800 Family history of malignant neoplasm of digestive organs: Secondary | ICD-10-CM

## 2016-10-18 DIAGNOSIS — I480 Paroxysmal atrial fibrillation: Secondary | ICD-10-CM | POA: Diagnosis not present

## 2016-10-18 DIAGNOSIS — J189 Pneumonia, unspecified organism: Secondary | ICD-10-CM | POA: Diagnosis not present

## 2016-10-18 DIAGNOSIS — Z7901 Long term (current) use of anticoagulants: Secondary | ICD-10-CM | POA: Diagnosis not present

## 2016-10-18 DIAGNOSIS — I110 Hypertensive heart disease with heart failure: Secondary | ICD-10-CM | POA: Diagnosis present

## 2016-10-18 DIAGNOSIS — E7800 Pure hypercholesterolemia, unspecified: Secondary | ICD-10-CM | POA: Diagnosis present

## 2016-10-18 DIAGNOSIS — Q2381 Bicuspid aortic valve: Secondary | ICD-10-CM

## 2016-10-18 DIAGNOSIS — I7121 Aneurysm of the ascending aorta, without rupture: Secondary | ICD-10-CM | POA: Diagnosis present

## 2016-10-18 DIAGNOSIS — H9193 Unspecified hearing loss, bilateral: Secondary | ICD-10-CM | POA: Diagnosis present

## 2016-10-18 DIAGNOSIS — I712 Thoracic aortic aneurysm, without rupture: Secondary | ICD-10-CM | POA: Diagnosis present

## 2016-10-18 DIAGNOSIS — R0902 Hypoxemia: Secondary | ICD-10-CM | POA: Diagnosis present

## 2016-10-18 DIAGNOSIS — I10 Essential (primary) hypertension: Secondary | ICD-10-CM | POA: Diagnosis not present

## 2016-10-18 DIAGNOSIS — E785 Hyperlipidemia, unspecified: Secondary | ICD-10-CM | POA: Diagnosis present

## 2016-10-18 DIAGNOSIS — I359 Nonrheumatic aortic valve disorder, unspecified: Secondary | ICD-10-CM | POA: Diagnosis present

## 2016-10-18 DIAGNOSIS — J181 Lobar pneumonia, unspecified organism: Secondary | ICD-10-CM

## 2016-10-18 DIAGNOSIS — R778 Other specified abnormalities of plasma proteins: Secondary | ICD-10-CM

## 2016-10-18 DIAGNOSIS — R091 Pleurisy: Secondary | ICD-10-CM | POA: Diagnosis not present

## 2016-10-18 DIAGNOSIS — Q231 Congenital insufficiency of aortic valve: Secondary | ICD-10-CM | POA: Diagnosis not present

## 2016-10-18 DIAGNOSIS — Z8249 Family history of ischemic heart disease and other diseases of the circulatory system: Secondary | ICD-10-CM | POA: Diagnosis not present

## 2016-10-18 DIAGNOSIS — I350 Nonrheumatic aortic (valve) stenosis: Secondary | ICD-10-CM | POA: Diagnosis present

## 2016-10-18 DIAGNOSIS — A419 Sepsis, unspecified organism: Principal | ICD-10-CM

## 2016-10-18 DIAGNOSIS — R042 Hemoptysis: Secondary | ICD-10-CM | POA: Diagnosis not present

## 2016-10-18 DIAGNOSIS — R0602 Shortness of breath: Secondary | ICD-10-CM | POA: Diagnosis not present

## 2016-10-18 DIAGNOSIS — R531 Weakness: Secondary | ICD-10-CM

## 2016-10-18 DIAGNOSIS — R079 Chest pain, unspecified: Secondary | ICD-10-CM | POA: Diagnosis present

## 2016-10-18 DIAGNOSIS — R0600 Dyspnea, unspecified: Secondary | ICD-10-CM | POA: Diagnosis present

## 2016-10-18 DIAGNOSIS — I4891 Unspecified atrial fibrillation: Secondary | ICD-10-CM | POA: Diagnosis not present

## 2016-10-18 HISTORY — DX: Acute diastolic (congestive) heart failure: I50.31

## 2016-10-18 LAB — URINALYSIS, MICROSCOPIC (REFLEX): RBC / HPF: NONE SEEN RBC/hpf (ref 0–5)

## 2016-10-18 LAB — CBC WITH DIFFERENTIAL/PLATELET
Band Neutrophils: 9 %
Basophils Absolute: 0 10*3/uL (ref 0.0–0.1)
Basophils Relative: 0 %
Blasts: 0 %
Eosinophils Absolute: 0 10*3/uL (ref 0.0–0.7)
Eosinophils Relative: 0 %
HCT: 35.8 % — ABNORMAL LOW (ref 39.0–52.0)
Hemoglobin: 12.1 g/dL — ABNORMAL LOW (ref 13.0–17.0)
Lymphocytes Relative: 5 %
Lymphs Abs: 0.6 10*3/uL — ABNORMAL LOW (ref 0.7–4.0)
MCH: 31.3 pg (ref 26.0–34.0)
MCHC: 33.8 g/dL (ref 30.0–36.0)
MCV: 92.5 fL (ref 78.0–100.0)
Metamyelocytes Relative: 0 %
Monocytes Absolute: 0.1 10*3/uL (ref 0.1–1.0)
Monocytes Relative: 1 %
Myelocytes: 0 %
Neutro Abs: 11.8 10*3/uL — ABNORMAL HIGH (ref 1.7–7.7)
Neutrophils Relative %: 85 %
Platelets: 138 10*3/uL — ABNORMAL LOW (ref 150–400)
Promyelocytes Absolute: 0 %
RBC: 3.87 MIL/uL — ABNORMAL LOW (ref 4.22–5.81)
RDW: 13.2 % (ref 11.5–15.5)
WBC: 12.5 10*3/uL — ABNORMAL HIGH (ref 4.0–10.5)
nRBC: 0 /100 WBC

## 2016-10-18 LAB — COMPREHENSIVE METABOLIC PANEL
ALT: 28 U/L (ref 17–63)
AST: 46 U/L — ABNORMAL HIGH (ref 15–41)
Albumin: 3.5 g/dL (ref 3.5–5.0)
Alkaline Phosphatase: 52 U/L (ref 38–126)
Anion gap: 12 (ref 5–15)
BUN: 55 mg/dL — ABNORMAL HIGH (ref 6–20)
CO2: 24 mmol/L (ref 22–32)
Calcium: 8.6 mg/dL — ABNORMAL LOW (ref 8.9–10.3)
Chloride: 97 mmol/L — ABNORMAL LOW (ref 101–111)
Creatinine, Ser: 2.84 mg/dL — ABNORMAL HIGH (ref 0.61–1.24)
GFR calc Af Amer: 23 mL/min — ABNORMAL LOW (ref >60)
GFR calc non Af Amer: 20 mL/min — ABNORMAL LOW (ref >60)
Glucose, Bld: 131 mg/dL — ABNORMAL HIGH (ref 65–99)
Potassium: 3.5 mmol/L (ref 3.5–5.1)
Sodium: 133 mmol/L — ABNORMAL LOW (ref 135–145)
Total Bilirubin: 1.4 mg/dL — ABNORMAL HIGH (ref 0.3–1.2)
Total Protein: 8.2 g/dL — ABNORMAL HIGH (ref 6.5–8.1)

## 2016-10-18 LAB — TROPONIN I
Troponin I: 0.11 ng/mL (ref <0.03)
Troponin I: 0.07 ng/mL (ref <0.03)

## 2016-10-18 LAB — URINALYSIS, ROUTINE W REFLEX MICROSCOPIC
Glucose, UA: NEGATIVE mg/dL
Hgb urine dipstick: NEGATIVE
Ketones, ur: 15 mg/dL — AB
Leukocytes, UA: NEGATIVE
Nitrite: NEGATIVE
Protein, ur: 30 mg/dL — AB
Specific Gravity, Urine: 1.016 (ref 1.005–1.030)
pH: 5.5 (ref 5.0–8.0)

## 2016-10-18 LAB — INFLUENZA PANEL BY PCR (TYPE A & B)
Influenza A By PCR: NEGATIVE
Influenza B By PCR: NEGATIVE

## 2016-10-18 LAB — I-STAT CG4 LACTIC ACID, ED: Lactic Acid, Venous: 1.86 mmol/L (ref 0.5–1.9)

## 2016-10-18 LAB — MRSA PCR SCREENING: MRSA by PCR: NEGATIVE

## 2016-10-18 MED ORDER — SENNOSIDES-DOCUSATE SODIUM 8.6-50 MG PO TABS
1.0000 | ORAL_TABLET | Freq: Every evening | ORAL | Status: DC | PRN
  Administered 2016-10-21 – 2016-10-24 (×4): 1 via ORAL
  Filled 2016-10-18 (×4): qty 1

## 2016-10-18 MED ORDER — DEXTROSE 5 % IV SOLN
500.0000 mg | INTRAVENOUS | Status: DC
  Administered 2016-10-19: 500 mg via INTRAVENOUS
  Filled 2016-10-18: qty 500

## 2016-10-18 MED ORDER — OSELTAMIVIR PHOSPHATE 30 MG PO CAPS
30.0000 mg | ORAL_CAPSULE | Freq: Once | ORAL | Status: DC
  Filled 2016-10-18 (×2): qty 1

## 2016-10-18 MED ORDER — GUAIFENESIN-DM 100-10 MG/5ML PO SYRP
5.0000 mL | ORAL_SOLUTION | ORAL | Status: DC | PRN
  Administered 2016-10-18 – 2016-10-19 (×3): 5 mL via ORAL
  Filled 2016-10-18 (×5): qty 10

## 2016-10-18 MED ORDER — IPRATROPIUM-ALBUTEROL 0.5-2.5 (3) MG/3ML IN SOLN
3.0000 mL | Freq: Once | RESPIRATORY_TRACT | Status: AC
  Administered 2016-10-18: 3 mL via RESPIRATORY_TRACT
  Filled 2016-10-18: qty 3

## 2016-10-18 MED ORDER — ORAL CARE MOUTH RINSE
15.0000 mL | Freq: Two times a day (BID) | OROMUCOSAL | Status: DC
  Administered 2016-10-18 – 2016-10-27 (×14): 15 mL via OROMUCOSAL

## 2016-10-18 MED ORDER — SODIUM CHLORIDE 0.9 % IV BOLUS (SEPSIS)
1000.0000 mL | Freq: Once | INTRAVENOUS | Status: AC
  Administered 2016-10-18: 1000 mL via INTRAVENOUS

## 2016-10-18 MED ORDER — ACETAMINOPHEN 500 MG PO TABS
1000.0000 mg | ORAL_TABLET | Freq: Once | ORAL | Status: AC
  Administered 2016-10-18: 1000 mg via ORAL
  Filled 2016-10-18: qty 2

## 2016-10-18 MED ORDER — DEXTROSE 5 % IV SOLN
1.0000 g | INTRAVENOUS | Status: DC
  Administered 2016-10-19 – 2016-10-28 (×10): 1 g via INTRAVENOUS
  Filled 2016-10-18 (×10): qty 10

## 2016-10-18 MED ORDER — APIXABAN 5 MG PO TABS
5.0000 mg | ORAL_TABLET | Freq: Two times a day (BID) | ORAL | Status: DC
  Administered 2016-10-18 – 2016-10-19 (×2): 5 mg via ORAL
  Filled 2016-10-18 (×2): qty 1

## 2016-10-18 MED ORDER — AZITHROMYCIN 500 MG IV SOLR
INTRAVENOUS | Status: AC
  Filled 2016-10-18: qty 500

## 2016-10-18 MED ORDER — POTASSIUM CHLORIDE IN NACL 20-0.9 MEQ/L-% IV SOLN
INTRAVENOUS | Status: AC
  Administered 2016-10-18 – 2016-10-20 (×5): via INTRAVENOUS
  Filled 2016-10-18 (×5): qty 1000

## 2016-10-18 MED ORDER — VITAMIN D 1000 UNITS PO TABS
1000.0000 [IU] | ORAL_TABLET | Freq: Every day | ORAL | Status: DC
  Administered 2016-10-18 – 2016-10-28 (×11): 1000 [IU] via ORAL
  Filled 2016-10-18 (×11): qty 1

## 2016-10-18 MED ORDER — DEXTROSE 5 % IV SOLN
500.0000 mg | Freq: Once | INTRAVENOUS | Status: AC
  Administered 2016-10-18: 500 mg via INTRAVENOUS

## 2016-10-18 MED ORDER — ACETAMINOPHEN 325 MG PO TABS
650.0000 mg | ORAL_TABLET | Freq: Four times a day (QID) | ORAL | Status: DC | PRN
  Administered 2016-10-19 – 2016-10-22 (×3): 650 mg via ORAL
  Filled 2016-10-18 (×3): qty 2

## 2016-10-18 MED ORDER — OSELTAMIVIR PHOSPHATE 75 MG PO CAPS: 75.0000 mg | ORAL_CAPSULE | Freq: Once | ORAL | Status: DC

## 2016-10-18 MED ORDER — ONDANSETRON HCL 4 MG/2ML IJ SOLN
4.0000 mg | Freq: Once | INTRAMUSCULAR | Status: AC
  Administered 2016-10-18: 4 mg via INTRAVENOUS
  Filled 2016-10-18: qty 2

## 2016-10-18 MED ORDER — ACETAMINOPHEN 650 MG RE SUPP: 650.0000 mg | Freq: Four times a day (QID) | RECTAL | Status: DC | PRN

## 2016-10-18 MED ORDER — DEXTROSE 5 % IV SOLN
1.0000 g | Freq: Once | INTRAVENOUS | Status: AC
  Administered 2016-10-18: 1 g via INTRAVENOUS
  Filled 2016-10-18: qty 10

## 2016-10-18 MED ORDER — OSELTAMIVIR PHOSPHATE 30 MG PO CAPS
30.0000 mg | ORAL_CAPSULE | Freq: Once | ORAL | Status: DC
  Filled 2016-10-18: qty 1

## 2016-10-18 NOTE — ED Triage Notes (Signed)
Pt reports cough, fevers up to 102 over the weekend, wife states he has had no fever since last night. Decreased appetite, body aches, chills over the weekend, none today. Pt states he is having some spotting blood from his nose and coughing up yellow sputum.

## 2016-10-18 NOTE — ED Notes (Signed)
Patient transported to X-ray 

## 2016-10-18 NOTE — H&P (Addendum)
History and Physical    Karis Cadena [id number] DOB: 21-Jun-1941 DOA: 10/18/2016  PCP: [name], MD (Confirm with patient/family/NH records and if not entered, this has to be entered at [facility] point of entry) Patient coming from: Home  Chief Complaint: Cough, fever.  HPI: Westin Spigelmyer is a 76 y.o. male with medical history significant of hypertension, bicuspid aortic valve, dyslipidemia, proximal A. Fib. Patient presented with cough of about 1 week duration productive of yellowish-green sputum. Fever started on Saturday recorded temperatures of 100-103 degree Fahrenheit. With malaise, poor appetite for the past week. Patient has been taking his home blood pressure medications metoprolol 50 mg twice a day. Patient admits to weight loss over the past week of 10 pounds, denies ever smoking cigarettes, family history of lung cancer. No alcohol use or IV drug use. No chest pain, shortness of breath only with prolonged prolonged coughing.  ED Course: In the ED patient was found to be febrile with a temperature of 100.1, white blood count mildly elevated at 12.5, troponin 0.1, hypotensive blood pressure- in the 0000000 systolic, without lactic acidosis. Chest x-ray will possible pneumonia or mass or aneurysm. Patient was started on ceftriaxone and azithromycin and given 3 L bolus of IV fluids.  Review of Systems: As per HPI otherwise 10 point review of systems negative. [name] SKIN- No Rash, colour changes or itching. HEAD- No Headache or dizziness. EYES- No Vision loss, pain, redness, double or blurred vision. Mouth/throat- No Sorethroat, dentures, or bleeding gums. GI- No nausea, vomiting, diarrhoea, constipation, abd pain, but admits to abdominal fullness URINARY- No Frequency, urgency, straining or dysuria. NEUROLOGIC- No Numbness, syncope, seizures or burning. [facility]- Denies depression or anxiety.  Past Medical History:  Diagnosis Date  . Aortic stenosis, mild    Echo 07/11: 55-60%, mild LV  hypertrophy, mild aortic stenosis w/mean gradient 69mmHg and peak gradient 36 mmHg.  . Ascending aortic aneurysm ([place])   . Bicuspid aortic valve   . Chest pain    ETT-myoview 12/11 w/exercise, no chest pain, no significant ST changes, EF 69%, no evidence for ischemia or infarction.  [name] HTN (hypertension)   . Hypercholesteremia   . LBP (low back pain)   . Moderate aortic stenosis   . Osteoarthritis   . Paroxysmal atrial fibrillation ([place])    a. new onset Afib in 07/2008. He underwent ibutilide cardioversion successfully. b. Recurrence 01/2013 s/p TEE/DCCV - was on Xarelto but he stopped it as he was convinced it was causing joint pn. c. Recurrence 01/2016 - spont conv to NSR. Pt took Eliquis x1 mo then declined further anticoag. d. Recurrence 07/2016.    Past Surgical History:  Procedure Laterality Date  . BACK SURGERY  x12 years ago  . CARDIOVERSION N/A 01/26/2013   Procedure: CARDIOVERSION;  Surgeon: Larey Dresser, MD;  Location: [place];  Service: Cardiovascular;  Laterality: N/A;  . HEMORRHOID SURGERY    . LUMBAR LAMINECTOMY    . TEE WITHOUT CARDIOVERSION N/A 01/26/2013   Procedure: TRANSESOPHAGEAL ECHOCARDIOGRAM (TEE);  Surgeon: Larey Dresser, MD;  Location: [facility];  Service: Cardiovascular;  Laterality: N/A;     reports that he has never smoked. He has never used smokeless tobacco. He reports that he drinks alcohol. He reports that he does not use drugs.  Allergies  Allergen Reactions  . Xarelto [Rivaroxaban] Other (See Comments)    INCREASED BP-HYPERTENSIVE EVENTS  . Benazepril Cough    Family History  Problem Relation Age of Onset  . Cancer Mother 16  colon  . Hypertension Other   . Coronary artery disease Neg Hx     Prior to Admission medications   Medication Sig Start Date End Date Taking? Authorizing Provider  apixaban (ELIQUIS) 5 MG TABS tablet Take 1 tablet (5 mg total) by mouth 2 (two) times daily. 08/03/16   Dayna N Dunn, PA-C  Cholecalciferol  1000 UNITS capsule Take 1,000 Units by mouth daily.    Historical Provider, MD  hydrochlorothiazide (HYDRODIURIL) 25 MG tablet Take 1 tablet (25 mg total) by mouth daily. 08/27/16   Larey Dresser, MD  metoprolol succinate (TOPROL-XL) 50 MG 24 hr tablet Take 1 tablet (50 mg total) by mouth 2 (two) times daily. Take with or immediately following a meal. 01/26/16   Larey Dresser, MD  ramipril (ALTACE) 5 MG capsule Take 1 capsule (5 mg total) by mouth daily. 08/27/16 11/25/16  Larey Dresser, MD    Physical Exam: Vitals:   10/18/16 1400 10/18/16 1430 10/18/16 1531 10/18/16 1600  BP: (!) 92/52 (!) 90/54 (!) 104/48 (!) 88/52  Pulse: 74 70 71 74  Resp: 22 24 19 20   Temp:   97.6 F (36.4 C)   TempSrc:   Oral   SpO2: 98% 97% 96% 96%  Weight:      Height:        Constitutional: NAD, calm, comfortable Vitals:   10/18/16 1400 10/18/16 1430 10/18/16 1531 10/18/16 1600  BP: (!) 92/52 (!) 90/54 (!) 104/48 (!) 88/52  Pulse: 74 70 71 74  Resp: 22 24 19 20   Temp:   97.6 F (36.4 C)   TempSrc:   Oral   SpO2: 98% 97% 96% 96%  Weight:      Height:       Eyes: PERRL, lids and conjunctivae normal ENMT: Mucous membranes are moist. Posterior pharynx clear of any exudate or lesions.Normal dentition.  Neck: normal, supple, no masses, no thyromegaly Respiratory: Breath sounds congested, no wheezing or crackles appreciated. Cardiovascular:  4/6 systolic murmur , regular Abdomen: no tenderness, no masses palpated. No hepatosplenomegaly. Bowel sounds positive.  Musculoskeletal: no clubbing / cyanosis. No joint deformity upper and lower extremities. Good ROM, no contractures. Normal muscle tone.  Skin: no rashes, lesions, ulcers. No induration Neurologic: CN 2-12 grossly intact. Sensation intact, DTR normal. Strength 5/5 in all 4.  Psychiatric: Normal judgment and insight. Alert and oriented x 3. Normal mood.   Labs on Admission: I have personally reviewed following labs and imaging  studies  CBC:  Recent Labs Lab 10/18/16 1000  WBC 12.5*  NEUTROABS 11.8*  HGB 12.1*  HCT 35.8*  MCV 92.5  PLT 0000000*   Basic Metabolic Panel:  Recent Labs Lab 10/18/16 1000  NA 133*  K 3.5  CL 97*  CO2 24  GLUCOSE 131*  BUN 55*  CREATININE 2.84*  CALCIUM 8.6*   Liver Function Tests:  Recent Labs Lab 10/18/16 1000  AST 46*  ALT 28  ALKPHOS 52  BILITOT 1.4*  PROT 8.2*  ALBUMIN 3.5   Cardiac Enzymes:  Recent Labs Lab 10/18/16 1000  TROPONINI 0.11*   Urine analysis:    Component Value Date/Time   COLORURINE AMBER (A) 10/18/2016 1125   APPEARANCEUR TURBID (A) 10/18/2016 1125   LABSPEC 1.016 10/18/2016 1125   PHURINE 5.5 10/18/2016 1125   GLUCOSEU NEGATIVE 10/18/2016 [place] 11/19/2015 1352   HGBUR NEGATIVE 10/18/2016 1125   Cisco (A) 10/18/2016 1125   KETONESUR 15 (A) 10/18/2016 1125   PROTEINUR  30 (A) 10/18/2016 1125   UROBILINOGEN 0.2 11/19/2015 1352   NITRITE NEGATIVE 10/18/2016 1125   LEUKOCYTESUR NEGATIVE 10/18/2016 1125   Radiological Exams on Admission: Dg Chest 2 View  Result Date: 10/18/2016 CLINICAL DATA:  Productive cough, fever. EXAM: CHEST  2 VIEW COMPARISON:  Radiograph of August 02, 2016. FINDINGS: Cardiac silhouette is unchanged. However, there is interval development of large rounded soft tissue abnormality seen in the left perihilar and suprahilar regions. This may simply represent pneumonia, but possible mass or aneurysm cannot be excluded. No pneumothorax or pleural effusion is noted. Right lung is clear. Bony thorax is unremarkable. IMPRESSION: Interval development of large rounded soft tissue density seen in left perihilar and suprahilar regions ; this may simply represent pneumonia, but possible mass or aneurysm cannot be excluded. CT scan of the chest with intravenous contrast is recommended for further evaluation. Electronically Signed   By: Marijo Conception, M.D.   On: 10/18/2016 10:10    EKG:  Independently reviewed. Sinus, significant Q waves in 1 and aVL.  Assessment/Plan Principal Problem:   CAP (community acquired pneumonia) Active Problems:   Essential hypertension   PAF (paroxysmal atrial fibrillation) (HCC)   Ascending aortic aneurysm (HCC)   Bicuspid aortic valve   Sepsis ([place])   Sepsis likely secondary to Community-acquired pneumonia- with cough and fever. White blood count of 12.5, hypertension systolic in the 0000000. Normal lactic acid. chest x-ray with large rounded soft tissue density in left perihilar and suprahilar regions- ?PNA, possible mass or aneurysm-- which patient has a history off and is undergoing workup for- with MRA chest done 07/26/2016.  - Admit to stepdown - Continue IV ceftriaxone and azithro- 10/19/15 ->> - Guaifenesin with dextromethorphan - IV fluids normal saline +20 KCL at 125 mL an hour for 1 day - Blood cultures f/u - Follow up urine cultures - Mass possibly an aneurysm, history does not suggest malignancy, recommendations by radiology to get CT scan w contrast. Consider renal insufficiency at this time will hold off on CT scan to kidney function improves. Also consider talking to radiology about prior MRA imaging done recently in November. - O2 by nasal cannula -  Flu Negative  Acute kidney injury creatinine 2.84, from baseline 0.8. Likely prerenal with elevated BUN at 55 - Likely due to sepsis and hypotension and poor by mouth intake- - Hydrate  - Labs a.m.  Hypertension- home medications ramipril HCTZ and metoprolol. His antihypertensive blood pressure has improved with 3 L of IV fluids - Hold blood pressure medications  Ascending aortic aneurysm, bicuspid aortic valve- follows with Dr. Aundra Dubin, [place]. MRA chest with and without contrast 08/12/2016, Multifactorial examination degradation without definitive evidence of interval change or complication associated with known mild fusiform aneurysmal dilatation of the ascending thoracic aorta  again measuring 41 mm in diameter. - Recommendations by Dr. Melodie Bouillon echo in 4/18 and MRA chest in 11/18.  Paroxysmal A. Fib- rate 70s, currently in sinus home medications metoprolol 50 mg XR daily, and eliquis.  - Hold metoprolol continue eliquis.   DVT prophylaxis: Eliquis for a. Fib Code Status:  full Family Communication: wife - present at bedside  Disposition Plan: home Consults called: None  Admission status: inpt, step down.  Bethena Roys MD [facility] Pager [phone]  If 7PM-7AM, please contact night-coverage [email or web address] Password [email or web address]  10/18/2016, 5:42 PM

## 2016-10-18 NOTE — ED Notes (Signed)
Report to Jacqueline, RN at [facility].  
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
## 2016-10-18 NOTE — ED Notes (Signed)
SpO2 @ 87% on room air, placed on 2l/m [place] SpO2 95-96%, RR 29, HR 82 after HHN

## 2016-10-18 NOTE — ED Notes (Signed)
Tamiflu dose not available at this facility; EDP aware.

## 2016-10-18 NOTE — Progress Notes (Signed)
Patient presents with poor appetite, cough and dyspnea, or Significant for left lung pneumonia versus mass, hypoxic, on Rocephin and azithromycin for CAP, on IV fluids for acute renal failure, continue with IV fluids, IV antibiotic, will need CT chest with IV contrast when renal function improves, check influenza when patient comes to as long, soft blood pressure, as well as elevated troponins, accepted to stepdown inpatient  Phillips Climes MD

## 2016-10-19 LAB — BASIC METABOLIC PANEL
Anion gap: 8 (ref 5–15)
BUN: 53 mg/dL — ABNORMAL HIGH (ref 6–20)
CO2: 24 mmol/L (ref 22–32)
Calcium: 7.9 mg/dL — ABNORMAL LOW (ref 8.9–10.3)
Chloride: 104 mmol/L (ref 101–111)
Creatinine, Ser: 2.17 mg/dL — ABNORMAL HIGH (ref 0.61–1.24)
GFR calc Af Amer: 32 mL/min — ABNORMAL LOW (ref >60)
GFR calc non Af Amer: 28 mL/min — ABNORMAL LOW (ref >60)
Glucose, Bld: 112 mg/dL — ABNORMAL HIGH (ref 65–99)
Potassium: 3.5 mmol/L (ref 3.5–5.1)
Sodium: 136 mmol/L (ref 135–145)

## 2016-10-19 LAB — CBC
HCT: 29.9 % — ABNORMAL LOW (ref 39.0–52.0)
Hemoglobin: 10.1 g/dL — ABNORMAL LOW (ref 13.0–17.0)
MCH: 30.7 pg (ref 26.0–34.0)
MCHC: 33.8 g/dL (ref 30.0–36.0)
MCV: 90.9 fL (ref 78.0–100.0)
Platelets: 131 10*3/uL — ABNORMAL LOW (ref 150–400)
RBC: 3.29 MIL/uL — ABNORMAL LOW (ref 4.22–5.81)
RDW: 13.5 % (ref 11.5–15.5)
WBC: 8.8 10*3/uL (ref 4.0–10.5)

## 2016-10-19 LAB — TROPONIN I
Troponin I: 0.07 ng/mL (ref <0.03)
Troponin I: 0.06 ng/mL (ref <0.03)

## 2016-10-19 MED ORDER — METOPROLOL SUCCINATE ER 25 MG PO TB24
50.0000 mg | ORAL_TABLET | Freq: Two times a day (BID) | ORAL | Status: DC
  Administered 2016-10-20: 50 mg via ORAL
  Filled 2016-10-19: qty 2

## 2016-10-19 MED ORDER — IPRATROPIUM-ALBUTEROL 0.5-2.5 (3) MG/3ML IN SOLN
3.0000 mL | Freq: Two times a day (BID) | RESPIRATORY_TRACT | Status: DC
  Administered 2016-10-19 (×2): 3 mL via RESPIRATORY_TRACT
  Filled 2016-10-19 (×2): qty 3

## 2016-10-19 MED ORDER — ALBUTEROL SULFATE (2.5 MG/3ML) 0.083% IN NEBU
2.5000 mg | INHALATION_SOLUTION | RESPIRATORY_TRACT | Status: DC | PRN
  Administered 2016-10-20: 2.5 mg via RESPIRATORY_TRACT
  Filled 2016-10-19: qty 3

## 2016-10-19 MED ORDER — IPRATROPIUM-ALBUTEROL 0.5-2.5 (3) MG/3ML IN SOLN
3.0000 mL | Freq: Three times a day (TID) | RESPIRATORY_TRACT | Status: DC
  Administered 2016-10-20 (×2): 3 mL via RESPIRATORY_TRACT
  Filled 2016-10-19 (×2): qty 3

## 2016-10-19 MED ORDER — SODIUM CHLORIDE 0.9 % IV BOLUS (SEPSIS)
500.0000 mL | Freq: Once | INTRAVENOUS | Status: AC
  Administered 2016-10-20: 500 mL via INTRAVENOUS

## 2016-10-19 MED ORDER — METOPROLOL TARTRATE 5 MG/5ML IV SOLN
5.0000 mg | Freq: Once | INTRAVENOUS | Status: AC
  Administered 2016-10-20: 5 mg via INTRAVENOUS
  Filled 2016-10-19: qty 5

## 2016-10-19 NOTE — Progress Notes (Signed)
MD paged after patient started coughing up blood. Patient able to get coughing under control and bloody sputum subsided. RN to continue to monitor.

## 2016-10-19 NOTE — Progress Notes (Addendum)
PROGRESS NOTE    Allanmichael Preiser  [id number] DOB: February 19, 1941 DOA: 10/18/2016 PCP: [name], MD   Brief Narrative: (Start on day 1 of progress note - keep it brief and live) Rawland Serino is a 76 y.o. male with medical history significant of hypertension, bicuspid aortic valve, dyslipidemia, proximal A. Fib. Patient presented with cough of about 1 week duration productive of yellowish-green sputum, Fever started on Saturday recorded temperatures of 100-103 degree Fahrenheit. With malaise, poor appetite for the past week. Patient has been taking his home blood pressure medications metoprolol 50 mg twice a day. Patient admits to weight loss over the past week of 10 pounds, denies ever smoking cigarettes, family history of lung cancer. In the ED patient was found to be febrile with a temperature of 100.1, white blood count mildly elevated at 12.5, troponin 0.1, hypotensive blood pressure- in the 0000000 systolic, without lactic acidosis. Chest x-ray will possible pneumonia or mass or aneurysm. Patient was started on ceftriaxone and azithromycin and given 3 L bolus of IV fluids.   Assessment & Plan:   Principal Problem:   CAP (community acquired pneumonia) Active Problems:   Essential hypertension   PAF (paroxysmal atrial fibrillation) (HCC)   Ascending aortic aneurysm (HCC)   Bicuspid aortic valve   Sepsis (HCC)   AKI (acute kidney injury) ([place])  Sepsis likely secondary to Community-acquired pneumonia- White blood count of 12.5, hypotension systolic in the 0000000. chest x-ray with large rounded soft tissue density in left perihilar and suprahilar regions- ?PNA, possible mass or aneurysm-- which patient has a history of and is undergoing workup for- with MRA chest done 07/26/2016.  - Continue IV ceftriaxone and azithro- 10/19/15 ->> - Guaifenesin with dextromethorphan - duonebs bid - cont IV fluids normal saline +20 KCL at 125 mL an hour for 2 days - Blood cultures f/u - Follow up urine  cultures - Mass possibly an aneurysm, ?malig, recommendations by radiology to get CT scan w contrast. Considering renal insufficiency at this time will hold off on CT scan to kidney function improves. Also consider talking to radiology about prior MRA imaging done recently in November. - O2 by nasal cannula -  Flu Negative - one time hemoptysis with mucus this am, likely pna, also ?malig, monitor, on anticoag, will HOLD. - Addendum- Hold patient to stepdown tonight, considering hemoptysis 2nd episode, none since then.   Acute kidney injury- improving, 2.1 from creatinine 2.84 on admission, from baseline 0.8. Likely prerenal with elevated BUN at 55 - Likely due to sepsis, hypotension and poor by mouth intake- - Hydrate  - Labs a.m.  Hypertension- home medications ramipril HCTZ and metoprolol. Hypotensive, bp improved with hydration. - Hold blood pressure medications  Ascending aortic aneurysm, bicuspid aortic valve- follows with Dr. Aundra Dubin, [place]. MRA chest with and without contrast 08/12/2016, Multifactorial examination degradation without definitive evidence of interval change or complication associated with known mild fusiform aneurysmal dilatation of the ascending thoracic aorta again measuring 41 mm in diameter. - Recommendations by Dr. Aundra Dubin, 12/17, Repeat echo in 4/18 and MRA chest in 11/18.  Paroxysmal A. Fib- rate 70s, currently in sinus home medications metoprolol 50 mg XR daily, and eliquis. CHADVAsc score-3, no CVA hx.  - Hold metoprolol  - Hold eliquis, with hemoptysis, restart if no more episodes of hemoptysis.   DVT prophylaxis: Eliquis for a. Fib Code Status:  full Family Communication: wife - present at bedside  Disposition Plan: home Consults called: None  Admission status: inpt, step down.  Consultants:   none   Procedures:none  Antimicrobials: (specify start and planned stop date. Auto populated tables are space occupying and do not give end  dates)  ceft and azithro- 2/5 >>   Subjective: Complaints of one time episode of coughing up blood, at about 4am. He cannot quantify the amount, but says it was a lot, on a towel, With mucus. Says it was actual blood not just streaks or stains. No blood clots.  Objective: Vitals:   10/19/16 0349 10/19/16 0400 10/19/16 0500 10/19/16 0700  BP:  (!) 122/56 (!) 122/54   Pulse:  77 78 75  Resp:  15 (!) 28 (!) 29  Temp: 99.4 F (37.4 C)     TempSrc: Oral     SpO2:  96% 97% 97%  Weight:      Height:        Intake/Output Summary (Last 24 hours) at 10/19/16 0842 Last data filed at 10/19/16 0500  Gross per 24 hour  Intake          3664.58 ml  Output              975 ml  Net          2689.58 ml   Filed Weights   10/18/16 0859  Weight: 86.2 kg (190 lb)    Examination:  General exam: Appears calm and comfortable  Respiratory system: mild expiratory wheezing diffusely. Cardiovascular system: S1 & S2 heard, RRR. 3/Gastrointestinal system: Abdomen is nondistended, soft and nontender. No organomegaly or masses felt. Normal bowel sounds heard. Central nervous system: Alert and oriented. No focal neurological deficits. Extremities: Symmetric 5 x 5 power. Skin: No rashes, lesions or ulcers Psychiatry: Judgement and insight appear normal. Mood & affect appropriate.     Data Reviewed: I have personally reviewed following labs and imaging studies  CBC:  Recent Labs Lab 10/18/16 1000 10/19/16 0639  WBC 12.5* 8.8  NEUTROABS 11.8*  --   HGB 12.1* 10.1*  HCT 35.8* 29.9*  MCV 92.5 90.9  PLT 138* [id number]*   Basic Metabolic Panel:  Recent Labs Lab 10/18/16 1000 10/19/16 0639  NA 133* 136  K 3.5 3.5  CL 97* 104  CO2 24 24  GLUCOSE 131* 112*  BUN 55* 53*  CREATININE 2.84* 2.17*  CALCIUM 8.6* 7.9*   GFR: Estimated Creatinine Clearance: 30.8 mL/min (by C-G formula based on SCr of 2.17 mg/dL (H)). Liver Function Tests:  Recent Labs Lab 10/18/16 1000  AST 46*  ALT 28   ALKPHOS 52  BILITOT 1.4*  PROT 8.2*  ALBUMIN 3.5   No results for input(s): LIPASE, AMYLASE in the last 168 hours. No results for input(s): AMMONIA in the last 168 hours. Coagulation Profile: No results for input(s): INR, PROTIME in the last 168 hours. Cardiac Enzymes:  Recent Labs Lab 10/18/16 1000 10/18/16 1830 10/18/16 2320 10/19/16 0639  TROPONINI 0.11* 0.07* 0.07* 0.06*   BNP (last 3 results) No results for input(s): PROBNP in the last 8760 hours. HbA1C: No results for input(s): HGBA1C in the last 72 hours. CBG: No results for input(s): GLUCAP in the last 168 hours. Lipid Profile: No results for input(s): CHOL, HDL, LDLCALC, TRIG, CHOLHDL, LDLDIRECT in the last 72 hours. Thyroid Function Tests: No results for input(s): TSH, T4TOTAL, FREET4, T3FREE, THYROIDAB in the last 72 hours. Anemia Panel: No results for input(s): VITAMINB12, FOLATE, FERRITIN, TIBC, IRON, RETICCTPCT in the last 72 hours. Sepsis Labs:  Recent Labs Lab 10/18/16 1011  LATICACIDVEN 1.86  Recent Results (from the past 240 hour(s))  MRSA PCR Screening     Status: None   Collection Time: 10/18/16  3:38 PM  Result Value Ref Range Status   MRSA by PCR NEGATIVE NEGATIVE Final    Comment:        The GeneXpert MRSA Assay (FDA approved for NASAL specimens only), is one component of a comprehensive MRSA colonization surveillance program. It is not intended to diagnose MRSA infection nor to guide or monitor treatment for MRSA infections.          Radiology Studies: Dg Chest 2 View  Result Date: 10/18/2016 CLINICAL DATA:  Productive cough, fever. EXAM: CHEST  2 VIEW COMPARISON:  Radiograph of August 02, 2016. FINDINGS: Cardiac silhouette is unchanged. However, there is interval development of large rounded soft tissue abnormality seen in the left perihilar and suprahilar regions. This may simply represent pneumonia, but possible mass or aneurysm cannot be excluded. No pneumothorax or  pleural effusion is noted. Right lung is clear. Bony thorax is unremarkable. IMPRESSION: Interval development of large rounded soft tissue density seen in left perihilar and suprahilar regions ; this may simply represent pneumonia, but possible mass or aneurysm cannot be excluded. CT scan of the chest with intravenous contrast is recommended for further evaluation. Electronically Signed   By: Marijo Conception, M.D.   On: 10/18/2016 10:10        Scheduled Meds: . apixaban  5 mg Oral BID  . azithromycin  500 mg Intravenous Q24H  . cefTRIAXone (ROCEPHIN)  IV  1 g Intravenous Q24H  . cholecalciferol  1,000 Units Oral Daily  . mouth rinse  15 mL Mouth Rinse BID   Continuous Infusions: . 0.9 % NaCl with KCl 20 mEq / L 125 mL/hr at 10/19/16 0214     LOS: 1 day   Bethena Roys, MD [facility] Pager [phone]  If 7PM-7AM, please contact night-coverage [email or web address] Password [facility] 10/19/2016, 8:42 AM

## 2016-10-20 ENCOUNTER — Inpatient Hospital Stay (HOSPITAL_COMMUNITY): Payer: Medicare Other

## 2016-10-20 DIAGNOSIS — A419 Sepsis, unspecified organism: Principal | ICD-10-CM

## 2016-10-20 LAB — BASIC METABOLIC PANEL
Anion gap: 6 (ref 5–15)
BUN: 37 mg/dL — ABNORMAL HIGH (ref 6–20)
CO2: 24 mmol/L (ref 22–32)
Calcium: 8.1 mg/dL — ABNORMAL LOW (ref 8.9–10.3)
Chloride: 109 mmol/L (ref 101–111)
Creatinine, Ser: 1.32 mg/dL — ABNORMAL HIGH (ref 0.61–1.24)
GFR calc Af Amer: 59 mL/min — ABNORMAL LOW (ref >60)
GFR calc non Af Amer: 51 mL/min — ABNORMAL LOW (ref >60)
Glucose, Bld: 105 mg/dL — ABNORMAL HIGH (ref 65–99)
Potassium: 3.4 mmol/L — ABNORMAL LOW (ref 3.5–5.1)
Sodium: 139 mmol/L (ref 135–145)

## 2016-10-20 MED ORDER — AZITHROMYCIN 250 MG PO TABS
500.0000 mg | ORAL_TABLET | Freq: Every day | ORAL | Status: DC
  Administered 2016-10-20 – 2016-10-22 (×3): 500 mg via ORAL
  Filled 2016-10-20 (×3): qty 2

## 2016-10-20 MED ORDER — DILTIAZEM HCL-DEXTROSE 100-5 MG/100ML-% IV SOLN (PREMIX)
5.0000 mg/h | INTRAVENOUS | Status: DC
  Administered 2016-10-20: 5 mg/h via INTRAVENOUS
  Filled 2016-10-20: qty 100

## 2016-10-20 MED ORDER — HYDROCOD POLST-CPM POLST ER 10-8 MG/5ML PO SUER: 5.0000 mL | Freq: Two times a day (BID) | ORAL | Status: DC | PRN

## 2016-10-20 MED ORDER — SODIUM CHLORIDE 0.9 % IJ SOLN
INTRAMUSCULAR | Status: AC
  Filled 2016-10-20: qty 50

## 2016-10-20 MED ORDER — GUAIFENESIN ER 600 MG PO TB12
1200.0000 mg | ORAL_TABLET | Freq: Two times a day (BID) | ORAL | Status: DC
  Administered 2016-10-20 – 2016-10-23 (×6): 1200 mg via ORAL
  Filled 2016-10-20 (×6): qty 2

## 2016-10-20 MED ORDER — DICLOFENAC SODIUM 1 % TD GEL
2.0000 g | Freq: Four times a day (QID) | TRANSDERMAL | Status: DC
Start: 2016-10-20 — End: 2016-10-28
  Administered 2016-10-20 – 2016-10-28 (×18): 2 g via TOPICAL
  Filled 2016-10-20: qty 100

## 2016-10-20 MED ORDER — IOPAMIDOL (ISOVUE-300) INJECTION 61%
INTRAVENOUS | Status: AC
  Administered 2016-10-20: 75 mL
  Filled 2016-10-20: qty 75

## 2016-10-20 MED ORDER — IPRATROPIUM-ALBUTEROL 0.5-2.5 (3) MG/3ML IN SOLN
3.0000 mL | Freq: Four times a day (QID) | RESPIRATORY_TRACT | Status: DC
  Administered 2016-10-20 – 2016-10-23 (×8): 3 mL via RESPIRATORY_TRACT
  Filled 2016-10-20 (×11): qty 3

## 2016-10-20 MED ORDER — IOPAMIDOL (ISOVUE-300) INJECTION 61%
INTRAVENOUS | Status: AC
  Filled 2016-10-20: qty 75

## 2016-10-20 MED ORDER — DIGOXIN 0.25 MG/ML IJ SOLN
0.2500 mg | Freq: Once | INTRAMUSCULAR | Status: AC
  Administered 2016-10-20: 0.25 mg via INTRAVENOUS
  Filled 2016-10-20: qty 1

## 2016-10-20 MED ORDER — DILTIAZEM HCL 25 MG/5ML IV SOLN
10.0000 mg | Freq: Once | INTRAVENOUS | Status: AC
  Administered 2016-10-20: 10 mg via INTRAVENOUS
  Filled 2016-10-20: qty 5

## 2016-10-20 MED ORDER — METOPROLOL SUCCINATE ER 50 MG PO TB24
50.0000 mg | ORAL_TABLET | Freq: Every day | ORAL | Status: DC
  Administered 2016-10-20 – 2016-10-24 (×5): 50 mg via ORAL
  Filled 2016-10-20: qty 2
  Filled 2016-10-20 (×4): qty 1

## 2016-10-20 MED ORDER — HYDROCOD POLST-CPM POLST ER 10-8 MG/5ML PO SUER
5.0000 mL | Freq: Two times a day (BID) | ORAL | Status: DC
  Administered 2016-10-20 – 2016-10-23 (×7): 5 mL via ORAL
  Filled 2016-10-20 (×6): qty 5

## 2016-10-20 MED ORDER — BENZONATATE 100 MG PO CAPS
200.0000 mg | ORAL_CAPSULE | Freq: Three times a day (TID) | ORAL | Status: DC
  Administered 2016-10-20 – 2016-10-28 (×25): 200 mg via ORAL
  Filled 2016-10-20 (×25): qty 2

## 2016-10-20 MED ORDER — SODIUM CHLORIDE 0.9 % IV BOLUS (SEPSIS)
500.0000 mL | Freq: Once | INTRAVENOUS | Status: AC
  Administered 2016-10-20: 500 mL via INTRAVENOUS

## 2016-10-20 NOTE — Progress Notes (Signed)
Pt observed coughing and then proceeded to get extremely tachycardic with HR in the 140's-150's. EKG was obtained and BP was 127/66. Charge nurse notified and MD also notified. New orders given. Will continue to monitor patient.

## 2016-10-20 NOTE — Progress Notes (Signed)
Patient stable at time of transfer from ICU.  Agree with previous RN's assessment of patient, with the exception of a rash on patient's back with some drainage from a few of the pustules, that ICU RN was unaware of.  Dr. Wynelle Cleveland on floor and aware. Patient states rash is not itching. No new orders at this time.

## 2016-10-20 NOTE — Progress Notes (Signed)
PROGRESS NOTE    [name] [name]   [id number]  DOB: Jan 03, 1941  DOA: 10/18/2016 PCP: [name], MD   Brief Narrative:  Dontea Majicis a 76 y.o.malewith medical history of hypertension, bicuspid aortic valve,dyslipidemia, proximal A. Fib. Presented with cough of about 1 week duration productive of yellowish-green sputum, Fever starting on Saturday of 100-103 degree Fahrenheit, with malaise &poor appetite for the past week. Chest x-ray will possible pneumonia or mass or aneurysm. Patient was started on ceftriaxone and azithromycin and given 3 L bolus of IV fluids.  Subjective: Severe cough. No longer coughing up blood.   Assessment & Plan:   Principal Problem:   CAP (community acquired pneumonia)- sepsis - fever and hemoptysis- check CT chest to further evaluate mass like opacity- still has fever of 100.3 at this time - con Rocephin and Zithromax and f/u on CT in a few hrs - adding Tussionex, Tessalon, Mucinex and Nebs Q 6 routine for severe cough and congestion  Active Problems:   AKI (acute kidney injury) ([place]) - Cr 2.84 improved to 1.32  Mildly elevated Troponin - likely due to resp distress- check ECHO    Essential hypertension - holding Ramipril, HCTZ- cont Metoprolol    PAF (paroxysmal atrial fibrillation)  - cont Metoprolol- hold Eliquis- d/c Cardizem infusion    Ascending aortic aneurysm/   Bicuspid aortic valve - follow   DVT prophylaxis: *Lovenox Code Status: Full code Family Communication:  Disposition Plan: SNF?  Consultants:    Procedures:    Antimicrobials:  Anti-infectives    Start     Dose/Rate Route Frequency Ordered Stop   10/20/16 1000  azithromycin (ZITHROMAX) tablet 500 mg     500 mg Oral Daily 10/20/16 0813     10/19/16 1200  azithromycin (ZITHROMAX) 500 mg in dextrose 5 % 250 mL IVPB  Status:  Discontinued     500 mg 250 mL/hr over 60 Minutes Intravenous Every 24 hours 10/18/16 1741 10/20/16 0813   10/19/16 1000  cefTRIAXone  (ROCEPHIN) 1 g in dextrose 5 % 50 mL IVPB     1 g 100 mL/hr over 30 Minutes Intravenous Every 24 hours 10/18/16 1741     10/18/16 1345  oseltamivir (TAMIFLU) capsule 75 mg  Status:  Discontinued     75 mg Oral  Once 10/18/16 1340 10/18/16 1342   10/18/16 1345  oseltamivir (TAMIFLU) capsule 30 mg  Status:  Discontinued     30 mg Oral  Once 10/18/16 1342 10/18/16 1646   10/18/16 1315  oseltamivir (TAMIFLU) capsule 30 mg  Status:  Discontinued     30 mg Oral Once 10/18/16 1300 10/18/16 1340   10/18/16 1142  azithromycin (ZITHROMAX) 500 MG injection    Comments:  Peel, Adrienne   : cabinet override      10/18/16 1142 10/18/16 2344   10/18/16 1133  azithromycin (ZITHROMAX) 500 MG injection  Status:  Discontinued    Comments:  Peel, Adrienne   : cabinet override      10/18/16 1133 10/18/16 1144   10/18/16 1130  cefTRIAXone (ROCEPHIN) 1 g in dextrose 5 % 50 mL IVPB     1 g 100 mL/hr over 30 Minutes Intravenous  Once 10/18/16 1127 10/18/16 1227   10/18/16 1130  azithromycin (ZITHROMAX) 500 mg in dextrose 5 % 250 mL IVPB     500 mg 250 mL/hr over 60 Minutes Intravenous  Once 10/18/16 1127 10/18/16 1337       Objective: Vitals:   10/20/16 0852 10/20/16 0900 10/20/16  1048 10/20/16 1452  BP:  105/70 111/74 (!) 107/57  Pulse:  96 92 99  Resp:  (!) 21 (!) 22 19  Temp:   98.1 F (36.7 C) 100.1 F (37.8 C)  TempSrc:   Oral Oral  SpO2: 95% 92% 92% 99%  Weight:      Height:        Intake/Output Summary (Last 24 hours) at 10/20/16 1459 Last data filed at 10/20/16 0900  Gross per 24 hour  Intake          4564.04 ml  Output              675 ml  Net          3889.04 ml   Filed Weights   10/18/16 0859  Weight: 86.2 kg (190 lb)    Examination: General exam: Appears comfortable  HEENT: PERRLA, oral mucosa moist, no sclera icterus or thrush Respiratory system: +rhonchi and wheeze. Respiratory effort normal. Cardiovascular system: S1 & S2 heard, RRR.  No murmurs  Gastrointestinal  system: Abdomen soft, non-tender, nondistended. Normal bowel sound. No organomegaly Central nervous system: Alert and oriented. No focal neurological deficits. Extremities: No cyanosis, clubbing or edema Skin: small abrasions on back   Psychiatry:  Mood & affect appropriate.     Data Reviewed: I have personally reviewed following labs and imaging studies  CBC:  Recent Labs Lab 10/18/16 1000 10/19/16 0639  WBC 12.5* 8.8  NEUTROABS 11.8*  --   HGB 12.1* 10.1*  HCT 35.8* 29.9*  MCV 92.5 90.9  PLT 138* A999333*   Basic Metabolic Panel:  Recent Labs Lab 10/18/16 1000 10/19/16 0639 10/20/16 0359  NA 133* 136 139  K 3.5 3.5 3.4*  CL 97* 104 109  CO2 24 24 24   GLUCOSE 131* 112* 105*  BUN 55* 53* 37*  CREATININE 2.84* 2.17* 1.32*  CALCIUM 8.6* 7.9* 8.1*   GFR: Estimated Creatinine Clearance: 50.7 mL/min (by C-G formula based on SCr of 1.32 mg/dL (H)). Liver Function Tests:  Recent Labs Lab 10/18/16 1000  AST 46*  ALT 28  ALKPHOS 52  BILITOT 1.4*  PROT 8.2*  ALBUMIN 3.5   No results for input(s): LIPASE, AMYLASE in the last 168 hours. No results for input(s): AMMONIA in the last 168 hours. Coagulation Profile: No results for input(s): INR, PROTIME in the last 168 hours. Cardiac Enzymes:  Recent Labs Lab 10/18/16 1000 10/18/16 1830 10/18/16 2320 10/19/16 0639  TROPONINI 0.11* 0.07* 0.07* 0.06*   BNP (last 3 results) No results for input(s): PROBNP in the last 8760 hours. HbA1C: No results for input(s): HGBA1C in the last 72 hours. CBG: No results for input(s): GLUCAP in the last 168 hours. Lipid Profile: No results for input(s): CHOL, HDL, LDLCALC, TRIG, CHOLHDL, LDLDIRECT in the last 72 hours. Thyroid Function Tests: No results for input(s): TSH, T4TOTAL, FREET4, T3FREE, THYROIDAB in the last 72 hours. Anemia Panel: No results for input(s): VITAMINB12, FOLATE, FERRITIN, TIBC, IRON, RETICCTPCT in the last 72 hours. Urine analysis:    Component Value  Date/Time   COLORURINE AMBER (A) 10/18/2016 1125   APPEARANCEUR TURBID (A) 10/18/2016 1125   LABSPEC 1.016 10/18/2016 1125   PHURINE 5.5 10/18/2016 1125   GLUCOSEU NEGATIVE 10/18/2016 1125   GLUCOSEU NEGATIVE 11/19/2015 1352   HGBUR NEGATIVE 10/18/2016 1125   BILIRUBINUR SMALL (A) 10/18/2016 1125   KETONESUR 15 (A) 10/18/2016 1125   PROTEINUR 30 (A) 10/18/2016 1125   UROBILINOGEN 0.2 11/19/2015 1352   NITRITE NEGATIVE 10/18/2016 1125  LEUKOCYTESUR NEGATIVE 10/18/2016 1125   Sepsis Labs: @LABRCNTIP (procalcitonin:4,lacticidven:4) ) Recent Results (from the past 240 hour(s))  MRSA PCR Screening     Status: None   Collection Time: 10/18/16  3:38 PM  Result Value Ref Range Status   MRSA by PCR NEGATIVE NEGATIVE Final    Comment:        The GeneXpert MRSA Assay (FDA approved for NASAL specimens only), is one component of a comprehensive MRSA colonization surveillance program. It is not intended to diagnose MRSA infection nor to guide or monitor treatment for MRSA infections.   Culture, blood (Routine X 2) w Reflex to ID Panel     Status: None (Preliminary result)   Collection Time: 10/18/16  6:29 PM  Result Value Ref Range Status   Specimen Description BLOOD BLOOD RIGHT FOREARM  Final   Special Requests IN PEDIATRIC BOTTLE 4 CC  Final   Culture   Final    NO GROWTH < 24 HOURS Performed at [facility], [place] [address], [place], [place] [address]    Report Status PENDING  Incomplete  Culture, blood (Routine X 2) w Reflex to ID Panel     Status: None (Preliminary result)   Collection Time: 10/18/16  6:29 PM  Result Value Ref Range Status   Specimen Description BLOOD LEFT ANTECUBITAL  Final   Special Requests BOTTLES DRAWN AEROBIC ONLY 5 CC  Final   Culture   Final    NO GROWTH < 24 HOURS Performed at [facility], [place] [address], [place], [place] [address]    Report Status PENDING  Incomplete         Radiology Studies: No results  found.    Scheduled Meds: . azithromycin  500 mg Oral Daily  . benzonatate  200 mg Oral TID  . cefTRIAXone (ROCEPHIN)  IV  1 g Intravenous Q24H  . chlorpheniramine-HYDROcodone  5 mL Oral Q12H  . cholecalciferol  1,000 Units Oral Daily  . diclofenac sodium  2 g Topical QID  . guaiFENesin  1,200 mg Oral BID  . ipratropium-albuterol  3 mL Nebulization Q6H  . mouth rinse  15 mL Mouth Rinse BID  . metoprolol succinate  50 mg Oral Daily   Continuous Infusions:   LOS: 2 days    Time spent in minutes: 47    [place], MD [facility] Pager: [email or web address] Password [email or web address] 10/20/2016, 2:59 PM

## 2016-10-20 NOTE — Progress Notes (Signed)
cardizem drip stopped per Dr. Wynelle Cleveland.  Patient to go to CT with charge RN Othella Boyer.

## 2016-10-21 ENCOUNTER — Inpatient Hospital Stay (HOSPITAL_COMMUNITY): Payer: Medicare Other

## 2016-10-21 DIAGNOSIS — J918 Pleural effusion in other conditions classified elsewhere: Secondary | ICD-10-CM

## 2016-10-21 DIAGNOSIS — J181 Lobar pneumonia, unspecified organism: Secondary | ICD-10-CM

## 2016-10-21 DIAGNOSIS — J189 Pneumonia, unspecified organism: Secondary | ICD-10-CM

## 2016-10-21 DIAGNOSIS — I4891 Unspecified atrial fibrillation: Secondary | ICD-10-CM

## 2016-10-21 LAB — BASIC METABOLIC PANEL
Anion gap: 7 (ref 5–15)
BUN: 25 mg/dL — ABNORMAL HIGH (ref 6–20)
CO2: 25 mmol/L (ref 22–32)
Calcium: 8.2 mg/dL — ABNORMAL LOW (ref 8.9–10.3)
Chloride: 109 mmol/L (ref 101–111)
Creatinine, Ser: 0.95 mg/dL (ref 0.61–1.24)
GFR calc Af Amer: >60 mL/min (ref >60)
GFR calc non Af Amer: >60 mL/min (ref >60)
Glucose, Bld: 107 mg/dL — ABNORMAL HIGH (ref 65–99)
Potassium: 3.6 mmol/L (ref 3.5–5.1)
Sodium: 141 mmol/L (ref 135–145)

## 2016-10-21 LAB — STREP PNEUMONIAE URINARY ANTIGEN: Strep Pneumo Urinary Antigen: POSITIVE — AB

## 2016-10-21 LAB — ECHOCARDIOGRAM COMPLETE
Height: 71 in
Weight: 3040 oz

## 2016-10-21 MED ORDER — MENTHOL 3 MG MT LOZG
1.0000 | LOZENGE | OROMUCOSAL | Status: DC | PRN
  Filled 2016-10-21: qty 9

## 2016-10-21 MED ORDER — DILTIAZEM HCL ER COATED BEADS 120 MG PO CP24
120.0000 mg | ORAL_CAPSULE | Freq: Every day | ORAL | Status: DC
  Administered 2016-10-21: 120 mg via ORAL
  Filled 2016-10-21: qty 1

## 2016-10-21 MED ORDER — HYDROCOD POLST-CPM POLST ER 10-8 MG/5ML PO SUER
5.0000 mL | Freq: Once | ORAL | Status: AC
  Administered 2016-10-21: 5 mL via ORAL
  Filled 2016-10-21: qty 5

## 2016-10-21 MED ORDER — DEXTROMETHORPHAN POLISTIREX ER 30 MG/5ML PO SUER
60.0000 mg | Freq: Two times a day (BID) | ORAL | Status: DC
Start: 2016-10-21 — End: 2016-10-28
  Administered 2016-10-21 – 2016-10-28 (×15): 60 mg via ORAL
  Filled 2016-10-21 (×16): qty 10

## 2016-10-21 NOTE — Progress Notes (Signed)
Patient has intermittent coughing episodes which results in an unsustained increased Heart Rate. Rate can increase to 130s - 150s.  PCP on call was notified

## 2016-10-21 NOTE — Progress Notes (Signed)
  Echocardiogram 2D Echocardiogram has been performed.  [name] [name] L Androw 10/21/2016, 10:25 AM

## 2016-10-21 NOTE — Progress Notes (Signed)
PROGRESS NOTE    [name] [name]   [id number]  DOB: 06/13/1941  DOA: 10/18/2016 PCP: [name], MD   Brief Narrative:  [name] [name] a 76 y.o.malewith medical history of hypertension, bicuspid aortic valve,dyslipidemia, proximal A. Fib. Presented with cough of about 1 week duration productive of yellowish-green sputum, Fever starting on Saturday of 100-103 degree Fahrenheit, with malaise &poor appetite for the past week. Chest x-ray will possible pneumonia or mass or aneurysm. Patient was started on ceftriaxone and azithromycin and given 3 L bolus of IV fluids.  Subjective: Severe cough continues. No longer coughing up blood.   Assessment & Plan:   Principal Problem:   CAP (community acquired pneumonia)- sepsis - fever and hemoptysis-  CT chest to further evaluate mass like opacity reveals extensive pneumonia and pleural effusion- have called PCCM- they would like to due decubitus films tomorrow - cont Rocephin and Zithromax   - Tussionex, Tessalon, Mucinex, Delsym and Nebs Q 6 routine for severe cough and congestion  Active Problems:   AKI (acute kidney injury) (HCC) - prerenal- holding Ramipril and HCTZ - Cr 2.84 >>> 1.32 >>> 0.95   Mildly elevated Troponin - likely due to resp distress- check ECHO    Essential hypertension - holding Ramipril, HCTZ- cont Metoprolol    PAF (paroxysmal atrial fibrillation)  - d/c Cardizem infusion and start oral Cardizem - cont Metoprolol- holding Eliquis due to hemoptysis - patient constantly stating it is "poison"     Ascending aortic aneurysm/   Bicuspid aortic valve - follow   DVT prophylaxis: *Lovenox Code Status: Full code Family Communication:  Disposition Plan: SNF?  Consultants:    Procedures:    Antimicrobials:  Anti-infectives    Start     Dose/Rate Route Frequency Ordered Stop   10/20/16 1000  azithromycin (ZITHROMAX) tablet 500 mg     500 mg Oral Daily 10/20/16 0813     10/19/16 1200  azithromycin  (ZITHROMAX) 500 mg in dextrose 5 % 250 mL IVPB  Status:  Discontinued     500 mg 250 mL/hr over 60 Minutes Intravenous Every 24 hours 10/18/16 1741 10/20/16 0813   10/19/16 1000  cefTRIAXone (ROCEPHIN) 1 g in dextrose 5 % 50 mL IVPB     1 g 100 mL/hr over 30 Minutes Intravenous Every 24 hours 10/18/16 1741     10/18/16 1345  oseltamivir (TAMIFLU) capsule 75 mg  Status:  Discontinued     75 mg Oral  Once 10/18/16 1340 10/18/16 1342   10/18/16 1345  oseltamivir (TAMIFLU) capsule 30 mg  Status:  Discontinued     30 mg Oral  Once 10/18/16 1342 10/18/16 1646   10/18/16 1315  oseltamivir (TAMIFLU) capsule 30 mg  Status:  Discontinued     30 mg Oral Once 10/18/16 1300 10/18/16 1340   10/18/16 1142  azithromycin (ZITHROMAX) 500 MG injection    Comments:  Peel, Adrienne   : cabinet override      10/18/16 1142 10/18/16 2344   10/18/16 1133  azithromycin (ZITHROMAX) 500 MG injection  Status:  Discontinued    Comments:  Peel, Adrienne   : cabinet override      10/18/16 1133 10/18/16 1144   10/18/16 1130  cefTRIAXone (ROCEPHIN) 1 g in dextrose 5 % 50 mL IVPB     1 g 100 mL/hr over 30 Minutes Intravenous  Once 10/18/16 1127 10/18/16 1227   10/18/16 1130  azithromycin (ZITHROMAX) 500 mg in dextrose 5 % 250 mL IVPB     500  mg 250 mL/hr over 60 Minutes Intravenous  Once 10/18/16 1127 10/18/16 1337       Objective: Vitals:   10/20/16 2228 10/21/16 0152 10/21/16 0708 10/21/16 1048  BP: 133/73  122/71 (!) 142/88  Pulse: 85  (!) 59 (!) 106  Resp: 20  20   Temp: 98.5 F (36.9 C)  99.4 F (37.4 C)   TempSrc: Oral  Oral   SpO2: 99% 98% 98%   Weight:      Height:        Intake/Output Summary (Last 24 hours) at 10/21/16 1401 Last data filed at 10/21/16 1108  Gross per 24 hour  Intake              240 ml  Output              275 ml  Net              -35 ml   Filed Weights   10/18/16 0859  Weight: 86.2 kg (190 lb)    Examination: General exam: Appears comfortable  HEENT: PERRLA, oral  mucosa moist, no sclera icterus or thrush Respiratory system: +rhonchi and wheeze. Respiratory effort normal. Cardiovascular system: S1 & S2 heard, RRR.  No murmurs  Gastrointestinal system: Abdomen soft, non-tender, nondistended. Normal bowel sound. No organomegaly Central nervous system: Alert and oriented. No focal neurological deficits. Extremities: No cyanosis, clubbing or edema Skin: small abrasions on back   Psychiatry:  Mood & affect appropriate.     Data Reviewed: I have personally reviewed following labs and imaging studies  CBC:  Recent Labs Lab 10/18/16 1000 10/19/16 0639  WBC 12.5* 8.8  NEUTROABS 11.8*  --   HGB 12.1* 10.1*  HCT 35.8* 29.9*  MCV 92.5 90.9  PLT 138* [id number]*   Basic Metabolic Panel:  Recent Labs Lab 10/18/16 1000 10/19/16 0639 10/20/16 0359 10/21/16 0530  NA 133* 136 139 141  K 3.5 3.5 3.4* 3.6  CL 97* 104 109 109  CO2 24 24 24 25   GLUCOSE 131* 112* 105* 107*  BUN 55* 53* 37* 25*  CREATININE 2.84* 2.17* 1.32* 0.95  CALCIUM 8.6* 7.9* 8.1* 8.2*   GFR: Estimated Creatinine Clearance: 70.5 mL/min (by C-G formula based on SCr of 0.95 mg/dL). Liver Function Tests:  Recent Labs Lab 10/18/16 1000  AST 46*  ALT 28  ALKPHOS 52  BILITOT 1.4*  PROT 8.2*  ALBUMIN 3.5   No results for input(s): LIPASE, AMYLASE in the last 168 hours. No results for input(s): AMMONIA in the last 168 hours. Coagulation Profile: No results for input(s): INR, PROTIME in the last 168 hours. Cardiac Enzymes:  Recent Labs Lab 10/18/16 1000 10/18/16 1830 10/18/16 2320 10/19/16 0639  TROPONINI 0.11* 0.07* 0.07* 0.06*   BNP (last 3 results) No results for input(s): PROBNP in the last 8760 hours. HbA1C: No results for input(s): HGBA1C in the last 72 hours. CBG: No results for input(s): GLUCAP in the last 168 hours. Lipid Profile: No results for input(s): CHOL, HDL, LDLCALC, TRIG, CHOLHDL, LDLDIRECT in the last 72 hours. Thyroid Function Tests: No results  for input(s): TSH, T4TOTAL, FREET4, T3FREE, THYROIDAB in the last 72 hours. Anemia Panel: No results for input(s): VITAMINB12, FOLATE, FERRITIN, TIBC, IRON, RETICCTPCT in the last 72 hours. Urine analysis:    Component Value Date/Time   COLORURINE AMBER (A) 10/18/2016 1125   APPEARANCEUR TURBID (A) 10/18/2016 1125   LABSPEC 1.016 10/18/2016 1125   PHURINE 5.5 10/18/2016 1125   GLUCOSEU NEGATIVE 10/18/2016 1125  GLUCOSEU NEGATIVE 11/19/2015 1352   HGBUR NEGATIVE 10/18/2016 1125   BILIRUBINUR SMALL (A) 10/18/2016 1125   KETONESUR 15 (A) 10/18/2016 1125   PROTEINUR 30 (A) 10/18/2016 1125   UROBILINOGEN 0.2 11/19/2015 1352   NITRITE NEGATIVE 10/18/2016 1125   LEUKOCYTESUR NEGATIVE 10/18/2016 1125   Sepsis Labs: @LABRCNTIP (procalcitonin:4,lacticidven:4) ) Recent Results (from the past 240 hour(s))  MRSA PCR Screening     Status: None   Collection Time: 10/18/16  3:38 PM  Result Value Ref Range Status   MRSA by PCR NEGATIVE NEGATIVE Final    Comment:        The GeneXpert MRSA Assay (FDA approved for NASAL specimens only), is one component of a comprehensive MRSA colonization surveillance program. It is not intended to diagnose MRSA infection nor to guide or monitor treatment for MRSA infections.   Culture, blood (Routine X 2) w Reflex to ID Panel     Status: None (Preliminary result)   Collection Time: 10/18/16  6:29 PM  Result Value Ref Range Status   Specimen Description BLOOD BLOOD RIGHT FOREARM  Final   Special Requests IN PEDIATRIC BOTTLE 4 CC  Final   Culture   Final    NO GROWTH 3 DAYS Performed at [facility], [place] [address], [place], [place] [address]    Report Status PENDING  Incomplete  Culture, blood (Routine X 2) w Reflex to ID Panel     Status: None (Preliminary result)   Collection Time: 10/18/16  6:29 PM  Result Value Ref Range Status   Specimen Description BLOOD LEFT ANTECUBITAL  Final   Special Requests BOTTLES DRAWN AEROBIC ONLY 5 CC  Final    Culture   Final    NO GROWTH 3 DAYS Performed at [facility] [address], [place], [place] [address]    Report Status PENDING  Incomplete         Radiology Studies: Ct Chest W Contrast  Result Date: 10/20/2016 CLINICAL DATA:  Hemoptysis, severe cough. Abnormal chest radiograph. EXAM: CT CHEST WITH CONTRAST TECHNIQUE: Multidetector CT imaging of the chest was performed during intravenous contrast administration. CONTRAST:  75 ISOVUE-300 IOPAMIDOL (ISOVUE-300) INJECTION 61% COMPARISON:  Chest radiograph 10/18/2016 and 08/02/2016. FINDINGS: Cardiovascular: Atherosclerotic calcification of the arterial vasculature, including coronary arteries and aortic valve left. Heart is mildly enlarged. No pericardial effusion. Mediastinum/Nodes: Subcentimeter low-attenuation lesion in the left left lobe of the thyroid. Low internal jugular lymph nodes are subcentimeter in short axis size. Mediastinal lymph nodes are not enlarged by CT size criteria. Left hilar lymph nodes measure up to 1.4 cm. No axillary adenopathy. Esophagus is grossly unremarkable. Small hiatal hernia. Lungs/Pleura: Patchy consolidation and surrounding ground-glass in the left upper lobe. Moderate left pleural effusion with compressive collapse/ consolidation in the left lower lobe. Small right pleural effusion with compressive atelectasis in the right lower lobe which extends posteriorly, simulating pleural thickening. Mild septal thickening at the lung bases. Image quality is degraded by respiratory motion. 3 mm right upper lobe nodule, nonspecific. Airway is grossly unremarkable. Upper Abdomen: Visualized portions of the liver, gallbladder, adrenal glands, kidneys, spleen, pancreas, stomach and bowel are grossly unremarkable with exception of a small hiatal hernia. No upper abdominal adenopathy. Musculoskeletal: A rounded lucent lesion is seen in the right glenoid. A similar but smaller lesion is seen in the left glenoid, favoring  benign lesions. Advanced degenerative disc disease at T12-L1. IMPRESSION: 1. Consolidation and surrounding ground-glass in the left upper lobe is likely due to pneumonia. Followup  PA and lateral chest X-ray is recommended in 3-4 weeks following trial of antibiotic therapy to ensure resolution and exclude underlying malignancy. 2. Moderate left pleural effusion with collapse/consolidation in the left lower lobe. 3. Small right pleural effusion with compressive atelectasis in the right lower lobe. 4. Suspect mild edema, raising concern for superimposed congestive heart failure. 5. Aortic atherosclerosis (ICD10-170.0). Coronary artery and aortic valvular calcification. Electronically Signed   By: Lorin Picket M.D.   On: 10/20/2016 16:02      Scheduled Meds: . azithromycin  500 mg Oral Daily  . benzonatate  200 mg Oral TID  . cefTRIAXone (ROCEPHIN)  IV  1 g Intravenous Q24H  . chlorpheniramine-HYDROcodone  5 mL Oral Q12H  . cholecalciferol  1,000 Units Oral Daily  . dextromethorphan  60 mg Oral BID  . diclofenac sodium  2 g Topical QID  . guaiFENesin  1,200 mg Oral BID  . ipratropium-albuterol  3 mL Nebulization Q6H  . mouth rinse  15 mL Mouth Rinse BID  . metoprolol succinate  50 mg Oral Daily   Continuous Infusions:   LOS: 3 days    Time spent in minutes: 72    [place], MD [facility] Pager: [email or web address] Password [email or web address] 10/21/2016, 2:01 PM

## 2016-10-21 NOTE — Consult Note (Signed)
Name: [name] [name] MRN: [id number] DOB: [birth year]    ADMISSION DATE:  10/18/2016 CONSULTATION DATE:  2/8  REFERRING MD : [facility]  CHIEF COMPLAINT:  Cough  BRIEF PATIENT DESCRIPTION: WNWD male  SIGNIFICANT EVENTS    STUDIES:     HISTORY OF PRESENT ILLNESS:   76 yo retired [place] from [place] , never smoker, with PAF and on Eliquis,who presented 10/18/16 with 1 week of cough, green yellow sputum, fever 103, 10 lb weight loss coupled with poor appetite and general malaise. Admitted and treated for CAP with abx and on ct scan noted to have left > right effusions and PCCM called to consult. US revealed small left effusion not really amenable to thoracentesis on 2/8. PCCM will continue to follow for possible thoracentesis in near future.  PAST MEDICAL HISTORY :   has a past medical history of Aortic stenosis, mild; Ascending aortic aneurysm ([place]); Bicuspid aortic valve; Chest pain; HTN (hypertension); Hypercholesteremia; LBP (low back pain); Moderate aortic stenosis; Osteoarthritis; and Paroxysmal atrial fibrillation ([place]).  has a past surgical history that includes Back surgery (x12 years ago); Lumbar laminectomy; Hemorrhoid surgery; TEE without cardioversion (N/A, 01/26/2013); and Cardioversion (N/A, 01/26/2013). Prior to Admission medications   Medication Sig Start Date End Date Taking? Authorizing Provider  acetaminophen (TYLENOL) 325 MG tablet Take 650 mg by mouth every 6 (six) hours as needed.   Yes Historical Provider, MD  apixaban (ELIQUIS) 5 MG TABS tablet Take 1 tablet (5 mg total) by mouth 2 (two) times daily. 08/03/16  Yes Dayna N Dunn, PA-C  Cholecalciferol 1000 UNITS capsule Take 1,000 Units by mouth daily.   Yes Historical Provider, MD  hydrochlorothiazide (MICROZIDE) 12.5 MG capsule Take 12.5 mg by mouth daily. 07/26/16  Yes Historical Provider, MD  ibuprofen (ADVIL,MOTRIN) 200 MG tablet Take 400 mg by mouth every 6 (six) hours as needed.   Yes Historical Provider,  MD  metoprolol succinate (TOPROL-XL) 50 MG 24 hr tablet Take 1 tablet (50 mg total) by mouth 2 (two) times daily. Take with or immediately following a meal. 01/26/16  Yes Larey Dresser, MD  ramipril (ALTACE) 5 MG capsule Take 1 capsule (5 mg total) by mouth daily. 08/27/16 11/25/16 Yes Larey Dresser, MD   Allergies  Allergen Reactions  . Xarelto [Rivaroxaban] Other (See Comments)    INCREASED BP-HYPERTENSIVE EVENTS  . Benazepril Cough    FAMILY HISTORY:  family history includes Cancer (age of onset: 68) in his mother; Hypertension in his other. SOCIAL HISTORY:  reports that he has never smoked. He has never used smokeless tobacco. He reports that he drinks alcohol. He reports that he does not use drugs.  REVIEW OF SYSTEMS:   10 point review of system taken, please see HPI for positives and negatives.  SUBJECTIVE:  NAD at rest VITAL SIGNS: Temp:  [98.1 F (36.7 C)-100.1 F (37.8 C)] 99.4 F (37.4 C) (02/08 0708) Pulse Rate:  [59-99] 59 (02/08 0708) Resp:  [18-22] 20 (02/08 0708) BP: (107-133)/(57-74) 122/71 (02/08 0708) SpO2:  [92 %-99 %] 98 % (02/08 0708)  PHYSICAL EXAMINATION: General:  Male in no acute distress. Very HOH Neuro:  Intact HEENT: No JVD/LAN Cardiovascular:  HSR RRR Lungs:  Diminished left base Abdomen:  Soft non tender Musculoskeletal:  intact Skin:  Warm and dry   Recent Labs Lab 10/19/16 0639 10/20/16 0359 10/21/16 0530  NA 136 139 141  K 3.5 3.4* 3.6  CL 104 109 109  CO2 24 24 25   BUN 53* 37*  25*  CREATININE 2.17* 1.32* 0.95  GLUCOSE 112* 105* 107*    Recent Labs Lab 10/18/16 1000 10/19/16 0639  HGB 12.1* 10.1*  HCT 35.8* 29.9*  WBC 12.5* 8.8  PLT 138* 131*   Ct Chest W Contrast  Result Date: 10/20/2016 CLINICAL DATA:  Hemoptysis, severe cough. Abnormal chest radiograph. EXAM: CT CHEST WITH CONTRAST TECHNIQUE: Multidetector CT imaging of the chest was performed during intravenous contrast administration. CONTRAST:  75 ISOVUE-300  IOPAMIDOL (ISOVUE-300) INJECTION 61% COMPARISON:  Chest radiograph 10/18/2016 and 08/02/2016. FINDINGS: Cardiovascular: Atherosclerotic calcification of the arterial vasculature, including coronary arteries and aortic valve left. Heart is mildly enlarged. No pericardial effusion. Mediastinum/Nodes: Subcentimeter low-attenuation lesion in the left left lobe of the thyroid. Low internal jugular lymph nodes are subcentimeter in short axis size. Mediastinal lymph nodes are not enlarged by CT size criteria. Left hilar lymph nodes measure up to 1.4 cm. No axillary adenopathy. Esophagus is grossly unremarkable. Small hiatal hernia. Lungs/Pleura: Patchy consolidation and surrounding ground-glass in the left upper lobe. Moderate left pleural effusion with compressive collapse/ consolidation in the left lower lobe. Small right pleural effusion with compressive atelectasis in the right lower lobe which extends posteriorly, simulating pleural thickening. Mild septal thickening at the lung bases. Image quality is degraded by respiratory motion. 3 mm right upper lobe nodule, nonspecific. Airway is grossly unremarkable. Upper Abdomen: Visualized portions of the liver, gallbladder, adrenal glands, kidneys, spleen, pancreas, stomach and bowel are grossly unremarkable with exception of a small hiatal hernia. No upper abdominal adenopathy. Musculoskeletal: A rounded lucent lesion is seen in the right glenoid. A similar but smaller lesion is seen in the left glenoid, favoring benign lesions. Advanced degenerative disc disease at T12-L1. IMPRESSION: 1. Consolidation and surrounding ground-glass in the left upper lobe is likely due to pneumonia. Followup PA and lateral chest X-ray is recommended in 3-4 weeks following trial of antibiotic therapy to ensure resolution and exclude underlying malignancy. 2. Moderate left pleural effusion with collapse/consolidation in the left lower lobe. 3. Small right pleural effusion with compressive  atelectasis in the right lower lobe. 4. Suspect mild edema, raising concern for superimposed congestive heart failure. 5. Aortic atherosclerosis (ICD10-170.0). Coronary artery and aortic valvular calcification. Electronically Signed   By: Lorin Picket M.D.   On: 10/20/2016 16:02    ASSESSMENT:   Principal Problem:   Left >right effusion    Hemoptysis   CAP (community acquired pneumonia)   Essential hypertension   Aortic valve disorder   PAF (paroxysmal atrial fibrillation) (HCC)   Occlusion and stenosis of carotid artery   Chest pain   Dyspnea   Ascending aortic aneurysm (HCC)   Bicuspid aortic valve   Sepsis (HCC)   AKI (acute kidney injury) [facility])  Discussion: 76 yo retired [place] from [place] , never smoker, with PAF and on Eliquis,who presented 10/18/16 with 1 week of cough, green yellow sputum, fever 103, 10 lb weight loss coupled with poor appetite and general malaise. Admitted and treated for CAP with abx and on ct scan noted to have left > right effusions and PCCM called to consult. US revealed small left effusion not really amenable to thoracentesis on 2/8. PCCM will continue to follow for possible thoracentesis in near future.   PLAN: [place] evaluation of chest. -> small left effusion Repeat US 1 -2 days for possible thora No further hemoptysis off eliquis O2 and BD as ordered Continue abx All other issues per IM  Richardson Landry Minor ACNP Maryanna Shape PCCM Pager [phone] till  3 pm If no answer page [phone] 10/21/2016, 10:46 AM

## 2016-10-21 NOTE — Evaluation (Signed)
Physical Therapy Evaluation Patient Details Name: [name] [name] MRN: [id number] DOB: 12-02-1940 Today's Date: 10/21/2016   History of Present Illness  76 y.o. male with medical history of hypertension, bicuspid aortic valve, dyslipidemia, proximal A. Fib and admitted for CAP and AKI. CT chest to further evaluate mass like opacity reveals extensive pneumonia and pleural effusion with PCCM following  Clinical Impression  Pt admitted with above diagnosis. Pt currently with functional limitations due to the deficits listed below (see PT Problem List).  Pt will benefit from skilled PT to increase their independence and safety with mobility to allow discharge to the venue listed below.  Pt happy to be ambulating however distance limited due to elevated HR (up to 170s at times).       Follow Up Recommendations Home health PT (possibly, pending progress)    Equipment Recommendations  Rolling walker with 5" wheels (may progress to no needs)    Recommendations for Other Services       Precautions / Restrictions Precautions Precautions: Fall Precaution Comments: monitor sats and HR      Mobility  Bed Mobility Overal bed mobility: Needs Assistance Bed Mobility: Supine to Sit;Sit to Supine     Supine to sit: Supervision Sit to supine: Supervision      Transfers Overall transfer level: Needs assistance Equipment used: None Transfers: Sit to/from Stand Sit to Stand: Min guard         General transfer comment: min/guard for safety, increased time  Ambulation/Gait Ambulation/Gait assistance: Min guard Ambulation Distance (Feet): 20 Feet Assistive device: None Gait Pattern/deviations: Step-through pattern;Decreased stride length     General Gait Details: pt pushed IV pole, distance limited due to elevated HR, pt wanted to walk even farther however spouse and PT coaxed pt back into room as HR 170 bpm, SPO2 93% on room air during ambulation, reapplied O2 [place] upon return to  room  Stairs            Wheelchair Mobility    Modified Rankin (Stroke Patients Only)       Balance                                             Pertinent Vitals/Pain Pain Assessment: No/denies pain  Flucuating HR at rest preactivity 115-140s bpm, up to 170 during ambulation, 115 bpm upon leaving room    Home Living Family/patient expects to be discharged to:: Private residence Living Arrangements: Spouse/significant other   Type of Home: House       Home Layout: Two level Home Equipment: None      Prior Function Level of Independence: Independent               Hand Dominance        Extremity/Trunk Assessment        Lower Extremity Assessment Lower Extremity Assessment: Generalized weakness       Communication   Communication: HOH  Cognition Arousal/Alertness: Awake/alert Behavior During Therapy: WFL for tasks assessed/performed Overall Cognitive Status: Within Functional Limits for tasks assessed                      General Comments      Exercises     Assessment/Plan    PT Assessment Patient needs continued PT services  PT Problem List Decreased strength;Decreased activity tolerance;Cardiopulmonary status limiting activity;Decreased mobility  PT Treatment Interventions DME instruction;Gait training;Therapeutic exercise;Therapeutic activities;Functional mobility training;Patient/family education    PT Goals (Current goals can be found in the Care Plan section)  Acute Rehab PT Goals PT Goal Formulation: With patient/family Time For Goal Achievement: 10/28/16 Potential to Achieve Goals: Good    Frequency Min 3X/week   Barriers to discharge        Co-evaluation               End of Session Equipment Utilized During Treatment: Gait belt;Oxygen Activity Tolerance: Patient tolerated treatment well Patient left: in bed;with bed alarm set;with call bell/phone within reach;with  family/visitor present           Time: [id number] PT Time Calculation (min) (ACUTE ONLY): 21 min   Charges:   PT Evaluation $PT Eval Moderate Complexity: 1 Procedure     PT G Codes:        [name] [name],[name] [name] 10/21/2016, 3:58 PM Carmelia Bake, PT, DPT 10/21/2016 Pager: [phone]

## 2016-10-21 NOTE — Progress Notes (Signed)
PT Cancellation Note  Patient Details Name: [name] [name] MRN: [id number] DOB: October 11, 1940   Cancelled Treatment:    Reason Eval/Treat Not Completed: Patient at procedure or test/unavailable;Medical issues which prohibited therapy (HR up to 170 on monitor, RN plans to give meds) MD also in room.  Will check back as schedule permits.   Kayd Launer,KATHrine E 10/21/2016, 10:30 AM Carmelia Bake, PT, DPT 10/21/2016 Pager: [phone]

## 2016-10-22 ENCOUNTER — Inpatient Hospital Stay (HOSPITAL_COMMUNITY): Payer: Medicare Other

## 2016-10-22 DIAGNOSIS — J918 Pleural effusion in other conditions classified elsewhere: Secondary | ICD-10-CM

## 2016-10-22 DIAGNOSIS — J189 Pneumonia, unspecified organism: Secondary | ICD-10-CM

## 2016-10-22 DIAGNOSIS — R091 Pleurisy: Secondary | ICD-10-CM | POA: Diagnosis not present

## 2016-10-22 LAB — PROTEIN, BODY FLUID: Total protein, fluid: <3 g/dL

## 2016-10-22 LAB — URINALYSIS, ROUTINE W REFLEX MICROSCOPIC
Bilirubin Urine: NEGATIVE
Glucose, UA: NEGATIVE mg/dL
Ketones, ur: 5 mg/dL — AB
Leukocytes, UA: NEGATIVE
Nitrite: NEGATIVE
Protein, ur: NEGATIVE mg/dL
Specific Gravity, Urine: 1.017 (ref 1.005–1.030)
pH: 5 (ref 5.0–8.0)

## 2016-10-22 LAB — BASIC METABOLIC PANEL
Anion gap: 8 (ref 5–15)
BUN: 19 mg/dL (ref 6–20)
CO2: 25 mmol/L (ref 22–32)
Calcium: 7.9 mg/dL — ABNORMAL LOW (ref 8.9–10.3)
Chloride: 105 mmol/L (ref 101–111)
Creatinine, Ser: 0.85 mg/dL (ref 0.61–1.24)
GFR calc Af Amer: >60 mL/min (ref >60)
GFR calc non Af Amer: >60 mL/min (ref >60)
Glucose, Bld: 100 mg/dL — ABNORMAL HIGH (ref 65–99)
Potassium: 3.1 mmol/L — ABNORMAL LOW (ref 3.5–5.1)
Sodium: 138 mmol/L (ref 135–145)

## 2016-10-22 LAB — BODY FLUID CELL COUNT WITH DIFFERENTIAL
Lymphs, Fluid: 3 %
Monocyte-Macrophage-Serous Fluid: 6 % — ABNORMAL LOW (ref 50–90)
Neutrophil Count, Fluid: 91 % — ABNORMAL HIGH (ref 0–25)
Total Nucleated Cell Count, Fluid: 1485 cu mm — ABNORMAL HIGH (ref 0–1000)

## 2016-10-22 LAB — CBC
HCT: 32.4 % — ABNORMAL LOW (ref 39.0–52.0)
Hemoglobin: 10.9 g/dL — ABNORMAL LOW (ref 13.0–17.0)
MCH: 31.3 pg (ref 26.0–34.0)
MCHC: 33.6 g/dL (ref 30.0–36.0)
MCV: 93.1 fL (ref 78.0–100.0)
Platelets: 279 10*3/uL (ref 150–400)
RBC: 3.48 MIL/uL — ABNORMAL LOW (ref 4.22–5.81)
RDW: 13.7 % (ref 11.5–15.5)
WBC: 6.6 10*3/uL (ref 4.0–10.5)

## 2016-10-22 LAB — LACTATE DEHYDROGENASE, PLEURAL OR PERITONEAL FLUID: LD, Fluid: 165 U/L — ABNORMAL HIGH (ref 3–23)

## 2016-10-22 LAB — PROTEIN, TOTAL: Total Protein: 6.7 g/dL (ref 6.5–8.1)

## 2016-10-22 LAB — LACTATE DEHYDROGENASE: LDH: 140 U/L (ref 98–192)

## 2016-10-22 MED ORDER — DILTIAZEM HCL ER COATED BEADS 180 MG PO CP24
180.0000 mg | ORAL_CAPSULE | Freq: Every day | ORAL | Status: DC
  Administered 2016-10-22: 180 mg via ORAL
  Filled 2016-10-22: qty 1

## 2016-10-22 MED ORDER — OXYMETAZOLINE HCL 0.05 % NA SOLN
1.0000 | Freq: Two times a day (BID) | NASAL | Status: DC
  Administered 2016-10-22 – 2016-10-28 (×13): 1 via NASAL
  Filled 2016-10-22: qty 15

## 2016-10-22 MED ORDER — URELLE 81 MG PO TABS
1.0000 | ORAL_TABLET | Freq: Four times a day (QID) | ORAL | Status: DC | PRN
  Administered 2016-10-22 – 2016-10-23 (×4): 81 mg via ORAL
  Filled 2016-10-22 (×6): qty 1

## 2016-10-22 MED ORDER — POTASSIUM CHLORIDE CRYS ER 20 MEQ PO TBCR
40.0000 meq | EXTENDED_RELEASE_TABLET | ORAL | Status: AC
  Administered 2016-10-22 (×2): 40 meq via ORAL
  Filled 2016-10-22 (×2): qty 2

## 2016-10-22 MED ORDER — METOPROLOL TARTRATE 5 MG/5ML IV SOLN
5.0000 mg | Freq: Once | INTRAVENOUS | Status: AC
  Administered 2016-10-22: 5 mg via INTRAVENOUS
  Filled 2016-10-22: qty 5

## 2016-10-22 MED ORDER — FLUTICASONE PROPIONATE 50 MCG/ACT NA SUSP
2.0000 | Freq: Every day | NASAL | Status: DC
  Administered 2016-10-22 – 2016-10-28 (×7): 2 via NASAL
  Filled 2016-10-22: qty 16

## 2016-10-22 MED ORDER — SODIUM CHLORIDE 0.9 % IV BOLUS (SEPSIS)
500.0000 mL | Freq: Once | INTRAVENOUS | Status: AC
  Administered 2016-10-22: 500 mL via INTRAVENOUS

## 2016-10-22 MED ORDER — DILTIAZEM HCL 25 MG/5ML IV SOLN
10.0000 mg | INTRAVENOUS | Status: DC | PRN
  Administered 2016-10-23 – 2016-10-24 (×4): 10 mg via INTRAVENOUS
  Filled 2016-10-22 (×7): qty 5

## 2016-10-22 NOTE — Progress Notes (Signed)
Pt. c/o "burning " when urinating. PCP was notified. Awaiting any new orders.

## 2016-10-22 NOTE — Progress Notes (Signed)
Name: [name] [name] MRN: [id number] DOB: 30-May-1941    ADMISSION DATE:  10/18/2016 CONSULTATION DATE:  2/8  REFERRING MD : [facility]  CHIEF COMPLAINT:  Cough   HISTORY OF PRESENT ILLNESS:   76 yo retired [place] from [place] , never smoker, with PAF and on Eliquis,who presented 10/18/16 with 1 week of cough, green yellow sputum, fever 103, 10 lb weight loss coupled with poor appetite and general malaise. Admitted and treated for CAP with abx and on ct scan noted to have left > right effusions and PCCM called to consult.   SUBJECTIVE:  Frequent cough Bloody sputum Afebrile , dyspneic on minimal activity   VITAL SIGNS: Temp:  [98.4 F (36.9 C)-99 F (37.2 C)] 98.7 F (37.1 C) (02/09 0444) Pulse Rate:  [86-142] 110 (02/09 0453) Resp:  [18-20] 18 (02/09 0453) BP: (109-133)/(82-98) 127/82 (02/09 0444) SpO2:  [95 %-99 %] 99 % (02/09 0808)  PHYSICAL EXAMINATION: General:  Male in no acute distress. Neuro:  Non focal, interactive HEENT: No JVD/LAN, hard of hearing Cardiovascular:  HSR RRR Lungs:  Diminished left base Abdomen:  Soft non tender Musculoskeletal:  intact Skin:  Warm and dry   Recent Labs Lab 10/20/16 0359 10/21/16 0530 10/22/16 0525  NA 139 141 138  K 3.4* 3.6 3.1*  CL 109 109 105  CO2 24 25 25   BUN 37* 25* 19  CREATININE 1.32* 0.95 0.85  GLUCOSE 105* 107* 100*    Recent Labs Lab 10/18/16 1000 10/19/16 0639 10/22/16 0525  HGB 12.1* 10.1* 10.9*  HCT 35.8* 29.9* 32.4*  WBC 12.5* 8.8 6.6  PLT 138* 131* 279   Dg Chest 2 View  Result Date: 10/22/2016 CLINICAL DATA:  Pneumonia. EXAM: CHEST  2 VIEW COMPARISON:  Radiographs of October 18, 2016. CT scan of October 30, 2016. FINDINGS: Stable cardiomediastinal silhouette. No pneumothorax is noted. Increased mild right basilar opacity is noted concerning for atelectasis or infiltrate. Stable left upper lobe opacity is noted consistent with pneumonia based on prior CT scan. Increased left basilar  opacity is noted concerning for worsening atelectasis or infiltrate with associated pleural effusion. Bony thorax is unremarkable. IMPRESSION: Stable left upper lobe opacity is noted consistent with pneumonia based on prior CT scan. Mildly increased right basilar opacity is noted concerning for atelectasis or infiltrate. Increased left basilar opacity is noted as described above. Electronically Signed   By: Marijo Conception, M.D.   On: 10/22/2016 09:48   Ct Chest W Contrast  Result Date: 10/20/2016 CLINICAL DATA:  Hemoptysis, severe cough. Abnormal chest radiograph. EXAM: CT CHEST WITH CONTRAST TECHNIQUE: Multidetector CT imaging of the chest was performed during intravenous contrast administration. CONTRAST:  75 ISOVUE-300 IOPAMIDOL (ISOVUE-300) INJECTION 61% COMPARISON:  Chest radiograph 10/18/2016 and 08/02/2016. FINDINGS: Cardiovascular: Atherosclerotic calcification of the arterial vasculature, including coronary arteries and aortic valve left. Heart is mildly enlarged. No pericardial effusion. Mediastinum/Nodes: Subcentimeter low-attenuation lesion in the left left lobe of the thyroid. Low internal jugular lymph nodes are subcentimeter in short axis size. Mediastinal lymph nodes are not enlarged by CT size criteria. Left hilar lymph nodes measure up to 1.4 cm. No axillary adenopathy. Esophagus is grossly unremarkable. Small hiatal hernia. Lungs/Pleura: Patchy consolidation and surrounding ground-glass in the left upper lobe. Moderate left pleural effusion with compressive collapse/ consolidation in the left lower lobe. Small right pleural effusion with compressive atelectasis in the right lower lobe which extends posteriorly, simulating pleural thickening. Mild septal thickening at the lung bases. Image quality is degraded by respiratory  motion. 3 mm right upper lobe nodule, nonspecific. Airway is grossly unremarkable. Upper Abdomen: Visualized portions of the liver, gallbladder, adrenal glands, kidneys,  spleen, pancreas, stomach and bowel are grossly unremarkable with exception of a small hiatal hernia. No upper abdominal adenopathy. Musculoskeletal: A rounded lucent lesion is seen in the right glenoid. A similar but smaller lesion is seen in the left glenoid, favoring benign lesions. Advanced degenerative disc disease at T12-L1. IMPRESSION: 1. Consolidation and surrounding ground-glass in the left upper lobe is likely due to pneumonia. Followup PA and lateral chest X-ray is recommended in 3-4 weeks following trial of antibiotic therapy to ensure resolution and exclude underlying malignancy. 2. Moderate left pleural effusion with collapse/consolidation in the left lower lobe. 3. Small right pleural effusion with compressive atelectasis in the right lower lobe. 4. Suspect mild edema, raising concern for superimposed congestive heart failure. 5. Aortic atherosclerosis (ICD10-170.0). Coronary artery and aortic valvular calcification. Electronically Signed   By: Lorin Picket M.D.   On: 10/20/2016 16:02   Dg Chest Bilateral Decubitus  Result Date: 10/22/2016 CLINICAL DATA:  Pleural effusion. EXAM: CHEST - BILATERAL DECUBITUS VIEW COMPARISON:  Radiographs of October 18, 2016. FINDINGS: Moderate free flowing left pleural effusion is noted. Mild free flowing right pleural effusion is noted. IMPRESSION: Free-flowing bilateral pleural effusions as described above. Electronically Signed   By: Marijo Conception, M.D.   On: 10/22/2016 09:45    ASSESSMENT:   Principal Problem:   Left >right parapneumonic effusion    Hemoptysis   CAP (community acquired pneumonia)    Discussion: Urine strep ag po >> confirms pneumococcal Proceed with thoracentesis today - risks & benefits discussed   PLAN:  FU pleural fluid results Will need intermittent CXR FU to resolution Dc azithro , ct ceftx - Eventually levaquin once clinically improved  Kara Mead MD. FCCP. [facility] Pager [phone] If  no response call [phone]     10/22/2016, 11:06 AM

## 2016-10-22 NOTE — Progress Notes (Signed)
PT Cancellation Note  Patient Details Name: [name] [name] MRN: [id number] DOB: Apr 04, 1941   Cancelled Treatment:    Reason Eval/Treat Not Completed: Medical issues which prohibited therapy Pt s/p thoracentesis earlier today and currently in afib with HR flucuating 115-149 bpm at rest.   Tempest Frankland,KATHrine E 10/22/2016, 2:53 PM Carmelia Bake, PT, DPT 10/22/2016 Pager: [phone]

## 2016-10-22 NOTE — Progress Notes (Signed)
Patient continues to be in A.Fib with rate between 105-130, as baseline rate. When patient exerts himself or has a harsh cough, the rate may range from 150s-170s.  PCP on call was notified.

## 2016-10-22 NOTE — Procedures (Addendum)
Thoracentesis Procedure Note  Pre-operative Diagnosis: Parapneumonic effusion, left  Post-operative Diagnosis: same  Indications: Parapneumonic effusion, left  Procedure Details  Consent: Informed consent was obtained. Risks of the procedure were discussed including: infection, bleeding, pain, pneumothorax.  Under sterile conditions the patient was positioned. Betadine solution and sterile drapes were utilized.  2% buffered lidocaine was used to anesthetize the left 7th rib space. Fluid was obtained without any difficulties and minimal blood loss.  A dressing was applied to the wound and wound care instructions were provided.   Findings Bedside US >> echo free space limited by diaphragm, lung &chest wall, marked for throacentesis 1000 ml of bloody pleural fluid was obtained. A sample was sent to Pathology for cytology and cell counts, as well as for chemistry & infection analysis.  Complications:  None; patient tolerated the procedure well.          Condition: stable  Plan A follow up chest x-ray was ordered. Bed Rest for 1 hours. Tylenol 650 mg. for pain.  Attending Attestation: I was present and scrubbed for the key portions of the procedure. Procedure performed by Kary Kos NP  Rigoberto Noel MD

## 2016-10-22 NOTE — Progress Notes (Signed)
PROGRESS NOTE    [name] [name]   [id number]  DOB: Mar 24, 1941  DOA: 10/18/2016 PCP: [name], MD   Brief Narrative:  [name] [name] a 76 y.o.malewith medical history of hypertension, bicuspid aortic valve,dyslipidemia, proximal A. Fib. Presented with cough of about 1 week duration productive of yellowish-green sputum, Fever starting on Saturday of 100-103 degree Fahrenheit, with malaise &poor appetite for the past week. Chest x-ray will possible pneumonia or mass or aneurysm. Patient was started on ceftriaxone and azithromycin and given 3 L bolus of IV fluids.  Subjective: Severe cough continues. Coughed up more blood yesterday.  Assessment & Plan:   Principal Problem:   CAP (community acquired pneumonia)- Sepsis- left pleural effusion - fever and hemoptysis-  CT chest to further evaluate mass like opacity reveals extensive pneumonia and pleural effusion- have called PCCM- they have ordered decubitus films today and done a thoracentesis- 1 L of blood fluid drained form left lung - strep Pneumo + >> cont Rocephin - d/c Zithromax - Tussionex, Tessalon, Mucinex, Delsym and Nebs Q 6 routine for severe cough and congestion  Active Problems:   AKI (acute kidney injury) (HCC) - prerenal- holding Ramipril and HCTZ - Cr 2.84 >>> 1.32 >>> 0.95   Mildly elevated Troponin/ Ao stenosis with bicuspid Ao Valve - likely due to resp distress - checked ECHO which reveals mod Ao stenosis- not significantly changed from prior    Essential hypertension - holding Ramipril, HCTZ- cont Metoprolol    PAF (paroxysmal atrial fibrillation)  - h/o cardioversion -  cont oral Cardizem- HR in 90- low 100s today - cont Metoprolol- holding Eliquis due to hemoptysis - patient constantly stating it is "poison"- per cardiology notes, he has refused anticoagulation in the past     Ascending aortic aneurysm/   Bicuspid aortic valve - follow   DVT prophylaxis: Lovenox Code Status: Full code Family  Communication:  Disposition Plan: SNF?  Consultants:   Pulmonary Procedures:  2 d ECHO 2D imaging suggests moderate aortic stenosis. Transaortic   gradients were much higher on the study from April 2017. Suspect   aortic valve gradients are underestimated on the current study   due to poor beam alignment.  Antimicrobials:  Anti-infectives    Start     Dose/Rate Route Frequency Ordered Stop   10/20/16 1000  azithromycin (ZITHROMAX) tablet 500 mg  Status:  Discontinued     500 mg Oral Daily 10/20/16 0813 10/22/16 1111   10/19/16 1200  azithromycin (ZITHROMAX) 500 mg in dextrose 5 % 250 mL IVPB  Status:  Discontinued     500 mg 250 mL/hr over 60 Minutes Intravenous Every 24 hours 10/18/16 1741 10/20/16 0813   10/19/16 1000  cefTRIAXone (ROCEPHIN) 1 g in dextrose 5 % 50 mL IVPB     1 g 100 mL/hr over 30 Minutes Intravenous Every 24 hours 10/18/16 1741     10/18/16 1345  oseltamivir (TAMIFLU) capsule 75 mg  Status:  Discontinued     75 mg Oral  Once 10/18/16 1340 10/18/16 1342   10/18/16 1345  oseltamivir (TAMIFLU) capsule 30 mg  Status:  Discontinued     30 mg Oral  Once 10/18/16 1342 10/18/16 1646   10/18/16 1315  oseltamivir (TAMIFLU) capsule 30 mg  Status:  Discontinued     30 mg Oral Once 10/18/16 1300 10/18/16 1340   10/18/16 1142  azithromycin (ZITHROMAX) 500 MG injection    Comments:  Peel, Adrienne   : cabinet override      10/18/16  1142 10/18/16 2344   10/18/16 1133  azithromycin (ZITHROMAX) 500 MG injection  Status:  Discontinued    Comments:  Peel, Adrienne   : cabinet override      10/18/16 1133 10/18/16 1144   10/18/16 1130  cefTRIAXone (ROCEPHIN) 1 g in dextrose 5 % 50 mL IVPB     1 g 100 mL/hr over 30 Minutes Intravenous  Once 10/18/16 1127 10/18/16 1227   10/18/16 1130  azithromycin (ZITHROMAX) 500 mg in dextrose 5 % 250 mL IVPB     500 mg 250 mL/hr over 60 Minutes Intravenous  Once 10/18/16 1127 10/18/16 1337       Objective: Vitals:   10/22/16 0453  10/22/16 0808 10/22/16 1115 10/22/16 1130  BP:   130/89 130/89  Pulse: (!) 110  93   Resp: 18     Temp:      TempSrc:      SpO2:  99%    Weight:      Height:        Intake/Output Summary (Last 24 hours) at 10/22/16 1313 Last data filed at 10/22/16 1208  Gross per 24 hour  Intake           244.17 ml  Output             1225 ml  Net          -980.83 ml   Filed Weights   10/18/16 0859  Weight: 86.2 kg (190 lb)    Examination: General exam: Appears comfortable  HEENT: PERRLA, oral mucosa moist, no sclera icterus or thrush Respiratory system: +rhonchi and wheeze. Respiratory effort normal. Cardiovascular system: S1 & S2 heard, RRR.  No murmurs  Gastrointestinal system: Abdomen soft, non-tender, nondistended. Normal bowel sound. No organomegaly Central nervous system: Alert and oriented. No focal neurological deficits. Extremities: No cyanosis, clubbing or edema Skin: small abrasions on back   Psychiatry:  Mood & affect appropriate.     Data Reviewed: I have personally reviewed following labs and imaging studies  CBC:  Recent Labs Lab 10/18/16 1000 10/19/16 0639 10/22/16 0525  WBC 12.5* 8.8 6.6  NEUTROABS 11.8*  --   --   HGB 12.1* 10.1* 10.9*  HCT 35.8* 29.9* 32.4*  MCV 92.5 90.9 93.1  PLT 138* 131* [id number]   Basic Metabolic Panel:  Recent Labs Lab 10/18/16 1000 10/19/16 0639 10/20/16 0359 10/21/16 0530 10/22/16 0525  NA 133* 136 139 141 138  K 3.5 3.5 3.4* 3.6 3.1*  CL 97* 104 109 109 105  CO2 24 24 24 25 25   GLUCOSE 131* 112* 105* 107* 100*  BUN 55* 53* 37* 25* 19  CREATININE 2.84* 2.17* 1.32* 0.95 0.85  CALCIUM 8.6* 7.9* 8.1* 8.2* 7.9*   GFR: Estimated Creatinine Clearance: 78.7 mL/min (by C-G formula based on SCr of 0.85 mg/dL). Liver Function Tests:  Recent Labs Lab 10/18/16 1000 10/22/16 1117  AST 46*  --   ALT 28  --   ALKPHOS 52  --   BILITOT 1.4*  --   PROT 8.2* 6.7  ALBUMIN 3.5  --    No results for input(s): LIPASE, AMYLASE in the  last 168 hours. No results for input(s): AMMONIA in the last 168 hours. Coagulation Profile: No results for input(s): INR, PROTIME in the last 168 hours. Cardiac Enzymes:  Recent Labs Lab 10/18/16 1000 10/18/16 1830 10/18/16 2320 10/19/16 0639  TROPONINI 0.11* 0.07* 0.07* 0.06*   BNP (last 3 results) No results for input(s): PROBNP in the last  8760 hours. HbA1C: No results for input(s): HGBA1C in the last 72 hours. CBG: No results for input(s): GLUCAP in the last 168 hours. Lipid Profile: No results for input(s): CHOL, HDL, LDLCALC, TRIG, CHOLHDL, LDLDIRECT in the last 72 hours. Thyroid Function Tests: No results for input(s): TSH, T4TOTAL, FREET4, T3FREE, THYROIDAB in the last 72 hours. Anemia Panel: No results for input(s): VITAMINB12, FOLATE, FERRITIN, TIBC, IRON, RETICCTPCT in the last 72 hours. Urine analysis:    Component Value Date/Time   COLORURINE YELLOW 10/21/2016 2355   APPEARANCEUR HAZY (A) 10/21/2016 2355   LABSPEC 1.017 10/21/2016 2355   PHURINE 5.0 10/21/2016 2355   GLUCOSEU NEGATIVE 10/21/2016 2355   GLUCOSEU NEGATIVE 11/19/2015 1352   HGBUR SMALL (A) 10/21/2016 2355   BILIRUBINUR NEGATIVE 10/21/2016 2355   KETONESUR 5 (A) 10/21/2016 2355   PROTEINUR NEGATIVE 10/21/2016 2355   UROBILINOGEN 0.2 11/19/2015 1352   NITRITE NEGATIVE 10/21/2016 2355   LEUKOCYTESUR NEGATIVE 10/21/2016 2355   Sepsis Labs: @LABRCNTIP (procalcitonin:4,lacticidven:4) ) Recent Results (from the past 240 hour(s))  MRSA PCR Screening     Status: None   Collection Time: 10/18/16  3:38 PM  Result Value Ref Range Status   MRSA by PCR NEGATIVE NEGATIVE Final    Comment:        The GeneXpert MRSA Assay (FDA approved for NASAL specimens only), is one component of a comprehensive MRSA colonization surveillance program. It is not intended to diagnose MRSA infection nor to guide or monitor treatment for MRSA infections.   Culture, blood (Routine X 2) w Reflex to ID Panel      Status: None (Preliminary result)   Collection Time: 10/18/16  6:29 PM  Result Value Ref Range Status   Specimen Description BLOOD BLOOD RIGHT FOREARM  Final   Special Requests IN PEDIATRIC BOTTLE 4 CC  Final   Culture   Final    NO GROWTH 3 DAYS Performed at [facility], [place] [address], [place], [place] [address]    Report Status PENDING  Incomplete  Culture, blood (Routine X 2) w Reflex to ID Panel     Status: None (Preliminary result)   Collection Time: 10/18/16  6:29 PM  Result Value Ref Range Status   Specimen Description BLOOD LEFT ANTECUBITAL  Final   Special Requests BOTTLES DRAWN AEROBIC ONLY 5 CC  Final   Culture   Final    NO GROWTH 3 DAYS Performed at [facility] [address], [place], [place] [address]    Report Status PENDING  Incomplete         Radiology Studies: Dg Chest 2 View  Result Date: 10/22/2016 CLINICAL DATA:  Pneumonia. EXAM: CHEST  2 VIEW COMPARISON:  Radiographs of October 18, 2016. CT scan of October 30, 2016. FINDINGS: Stable cardiomediastinal silhouette. No pneumothorax is noted. Increased mild right basilar opacity is noted concerning for atelectasis or infiltrate. Stable left upper lobe opacity is noted consistent with pneumonia based on prior CT scan. Increased left basilar opacity is noted concerning for worsening atelectasis or infiltrate with associated pleural effusion. Bony thorax is unremarkable. IMPRESSION: Stable left upper lobe opacity is noted consistent with pneumonia based on prior CT scan. Mildly increased right basilar opacity is noted concerning for atelectasis or infiltrate. Increased left basilar opacity is noted as described above. Electronically Signed   By: Marijo Conception, M.D.   On: 10/22/2016 09:48   Ct Chest W Contrast  Result Date: 10/20/2016 CLINICAL DATA:  Hemoptysis, severe cough. Abnormal chest  radiograph. EXAM: CT CHEST WITH CONTRAST TECHNIQUE: Multidetector CT imaging of the chest was performed during  intravenous contrast administration. CONTRAST:  75 ISOVUE-300 IOPAMIDOL (ISOVUE-300) INJECTION 61% COMPARISON:  Chest radiograph 10/18/2016 and 08/02/2016. FINDINGS: Cardiovascular: Atherosclerotic calcification of the arterial vasculature, including coronary arteries and aortic valve left. Heart is mildly enlarged. No pericardial effusion. Mediastinum/Nodes: Subcentimeter low-attenuation lesion in the left left lobe of the thyroid. Low internal jugular lymph nodes are subcentimeter in short axis size. Mediastinal lymph nodes are not enlarged by CT size criteria. Left hilar lymph nodes measure up to 1.4 cm. No axillary adenopathy. Esophagus is grossly unremarkable. Small hiatal hernia. Lungs/Pleura: Patchy consolidation and surrounding ground-glass in the left upper lobe. Moderate left pleural effusion with compressive collapse/ consolidation in the left lower lobe. Small right pleural effusion with compressive atelectasis in the right lower lobe which extends posteriorly, simulating pleural thickening. Mild septal thickening at the lung bases. Image quality is degraded by respiratory motion. 3 mm right upper lobe nodule, nonspecific. Airway is grossly unremarkable. Upper Abdomen: Visualized portions of the liver, gallbladder, adrenal glands, kidneys, spleen, pancreas, stomach and bowel are grossly unremarkable with exception of a small hiatal hernia. No upper abdominal adenopathy. Musculoskeletal: A rounded lucent lesion is seen in the right glenoid. A similar but smaller lesion is seen in the left glenoid, favoring benign lesions. Advanced degenerative disc disease at T12-L1. IMPRESSION: 1. Consolidation and surrounding ground-glass in the left upper lobe is likely due to pneumonia. Followup PA and lateral chest X-ray is recommended in 3-4 weeks following trial of antibiotic therapy to ensure resolution and exclude underlying malignancy. 2. Moderate left pleural effusion with collapse/consolidation in the left  lower lobe. 3. Small right pleural effusion with compressive atelectasis in the right lower lobe. 4. Suspect mild edema, raising concern for superimposed congestive heart failure. 5. Aortic atherosclerosis (ICD10-170.0). Coronary artery and aortic valvular calcification. Electronically Signed   By: Lorin Picket M.D.   On: 10/20/2016 16:02   Dg Chest Bilateral Decubitus  Result Date: 10/22/2016 CLINICAL DATA:  Pleural effusion. EXAM: CHEST - BILATERAL DECUBITUS VIEW COMPARISON:  Radiographs of October 18, 2016. FINDINGS: Moderate free flowing left pleural effusion is noted. Mild free flowing right pleural effusion is noted. IMPRESSION: Free-flowing bilateral pleural effusions as described above. Electronically Signed   By: Marijo Conception, M.D.   On: 10/22/2016 09:45   Dg Chest Port 1 View  Result Date: 10/22/2016 CLINICAL DATA:  Status post thoracentesis. EXAM: PORTABLE CHEST 1 VIEW COMPARISON:  October 22, 2016 FINDINGS: There is infiltrate in the left upper lobe which is stable. Opacity in the right base is slightly increased. No other changes. No pneumothorax. IMPRESSION: Stable left upper lobe infiltrate. Slight increase in right basilar opacity. Electronically Signed   By: Dorise Bullion III M.D   On: 10/22/2016 11:24      Scheduled Meds: . benzonatate  200 mg Oral TID  . cefTRIAXone (ROCEPHIN)  IV  1 g Intravenous Q24H  . chlorpheniramine-HYDROcodone  5 mL Oral Q12H  . cholecalciferol  1,000 Units Oral Daily  . dextromethorphan  60 mg Oral BID  . diclofenac sodium  2 g Topical QID  . diltiazem  180 mg Oral Daily  . fluticasone  2 spray Each Nare Daily  . guaiFENesin  1,200 mg Oral BID  . ipratropium-albuterol  3 mL Nebulization Q6H  . mouth rinse  15 mL Mouth Rinse BID  . metoprolol succinate  50 mg Oral Daily  . oxymetazoline  1 spray  Each Nare BID  . potassium chloride  40 mEq Oral Q4H   Continuous Infusions:   LOS: 4 days    Time spent in minutes: 28    [place],  MD [facility] Pager: [email or web address] Password [email or web address] 10/22/2016, 1:13 PM

## 2016-10-22 NOTE — Care Management Important Message (Signed)
Important Message  Patient Details  Name: [name] [name] MRN: [id number] Date of Birth: September 16, 1940   Medicare Important Message Given:  Yes    Kerin Salen 10/22/2016, 1:04 [place] Message  Patient Details  Name: [name] [name] MRN: [id number] Date of Birth: 1940-10-17   Medicare Important Message Given:  Yes    Kerin Salen 10/22/2016, 1:04 PM

## 2016-10-23 DIAGNOSIS — R042 Hemoptysis: Secondary | ICD-10-CM

## 2016-10-23 DIAGNOSIS — A408 Other streptococcal sepsis: Secondary | ICD-10-CM

## 2016-10-23 LAB — BASIC METABOLIC PANEL
Anion gap: 7 (ref 5–15)
BUN: 15 mg/dL (ref 6–20)
CO2: 25 mmol/L (ref 22–32)
Calcium: 8.2 mg/dL — ABNORMAL LOW (ref 8.9–10.3)
Chloride: 106 mmol/L (ref 101–111)
Creatinine, Ser: 0.77 mg/dL (ref 0.61–1.24)
GFR calc Af Amer: >60 mL/min (ref >60)
GFR calc non Af Amer: >60 mL/min (ref >60)
Glucose, Bld: 115 mg/dL — ABNORMAL HIGH (ref 65–99)
Potassium: 3.5 mmol/L (ref 3.5–5.1)
Sodium: 138 mmol/L (ref 135–145)

## 2016-10-23 LAB — CULTURE, BLOOD (ROUTINE X 2)
Culture: NO GROWTH
Culture: NO GROWTH

## 2016-10-23 LAB — PH, BODY FLUID: pH, Body Fluid: 7.8

## 2016-10-23 LAB — LEGIONELLA PNEUMOPHILA SEROGP 1 UR AG: L. pneumophila Serogp 1 Ur Ag: NEGATIVE

## 2016-10-23 MED ORDER — IPRATROPIUM BROMIDE 0.02 % IN SOLN
0.5000 mg | Freq: Four times a day (QID) | RESPIRATORY_TRACT | Status: DC
  Administered 2016-10-23: 0.5 mg via RESPIRATORY_TRACT

## 2016-10-23 MED ORDER — HYDROMORPHONE HCL 2 MG/ML IJ SOLN
1.0000 mg | INTRAMUSCULAR | Status: DC | PRN
  Administered 2016-10-24: 1 mg via INTRAVENOUS
  Filled 2016-10-23: qty 1

## 2016-10-23 MED ORDER — METOPROLOL TARTRATE 5 MG/5ML IV SOLN
5.0000 mg | Freq: Once | INTRAVENOUS | Status: AC
Start: 2016-10-23 — End: 2016-10-23
  Administered 2016-10-23: 5 mg via INTRAVENOUS
  Filled 2016-10-23: qty 5

## 2016-10-23 MED ORDER — DILTIAZEM HCL ER COATED BEADS 240 MG PO CP24
240.0000 mg | ORAL_CAPSULE | Freq: Every day | ORAL | Status: DC
  Administered 2016-10-23 – 2016-10-28 (×6): 240 mg via ORAL
  Filled 2016-10-23 (×6): qty 1

## 2016-10-23 MED ORDER — DILTIAZEM HCL 25 MG/5ML IV SOLN
10.0000 mg | Freq: Once | INTRAVENOUS | Status: AC
  Administered 2016-10-23: 10 mg via INTRAVENOUS
  Filled 2016-10-23: qty 5

## 2016-10-23 MED ORDER — DIGOXIN 0.25 MG/ML IJ SOLN
0.1250 mg | Freq: Once | INTRAMUSCULAR | Status: AC
  Administered 2016-10-23: 0.125 mg via INTRAVENOUS
  Filled 2016-10-23: qty 0.5

## 2016-10-23 MED ORDER — SODIUM CHLORIDE 0.9 % IV SOLN
INTRAVENOUS | Status: DC
  Administered 2016-10-23 – 2016-10-24 (×2): via INTRAVENOUS

## 2016-10-23 MED ORDER — TAMSULOSIN HCL 0.4 MG PO CAPS
0.4000 mg | ORAL_CAPSULE | Freq: Every day | ORAL | Status: DC
  Administered 2016-10-23 – 2016-10-28 (×6): 0.4 mg via ORAL
  Filled 2016-10-23 (×6): qty 1

## 2016-10-23 MED ORDER — LEVALBUTEROL HCL 1.25 MG/0.5ML IN NEBU
1.2500 mg | INHALATION_SOLUTION | Freq: Three times a day (TID) | RESPIRATORY_TRACT | Status: DC
  Administered 2016-10-23 (×2): 1.25 mg via RESPIRATORY_TRACT
  Filled 2016-10-23 (×3): qty 0.5

## 2016-10-23 MED ORDER — IPRATROPIUM BROMIDE 0.02 % IN SOLN
0.5000 mg | Freq: Three times a day (TID) | RESPIRATORY_TRACT | Status: DC
Start: 2016-10-23 — End: 2016-10-24
  Administered 2016-10-23 (×2): 0.5 mg via RESPIRATORY_TRACT
  Filled 2016-10-23 (×2): qty 2.5

## 2016-10-23 MED ORDER — SODIUM CHLORIDE 0.9 % IV BOLUS (SEPSIS)
1000.0000 mL | Freq: Once | INTRAVENOUS | Status: AC
  Administered 2016-10-23: 1000 mL via INTRAVENOUS

## 2016-10-23 MED ORDER — DIGOXIN 0.25 MG/ML IJ SOLN
0.2500 mg | Freq: Once | INTRAMUSCULAR | Status: AC
  Administered 2016-10-23: 0.25 mg via INTRAVENOUS
  Filled 2016-10-23: qty 1

## 2016-10-23 MED ORDER — METOPROLOL TARTRATE 5 MG/5ML IV SOLN
5.0000 mg | Freq: Once | INTRAVENOUS | Status: AC
  Administered 2016-10-24: 5 mg via INTRAVENOUS
  Filled 2016-10-23: qty 5

## 2016-10-23 MED ORDER — HYDROCOD POLST-CPM POLST ER 10-8 MG/5ML PO SUER
5.0000 mL | Freq: Three times a day (TID) | ORAL | Status: DC
  Administered 2016-10-23 – 2016-10-28 (×16): 5 mL via ORAL
  Filled 2016-10-23 (×15): qty 5

## 2016-10-23 MED ORDER — LEVALBUTEROL HCL 1.25 MG/0.5ML IN NEBU
1.2500 mg | INHALATION_SOLUTION | Freq: Four times a day (QID) | RESPIRATORY_TRACT | Status: DC
  Filled 2016-10-23: qty 0.5

## 2016-10-23 MED ORDER — DILTIAZEM HCL 100 MG IV SOLR
5.0000 mg/h | INTRAVENOUS | Status: DC
  Administered 2016-10-23: 5 mg/h via INTRAVENOUS
  Administered 2016-10-25: 7 mg/h via INTRAVENOUS
  Administered 2016-10-25: 15 mg/h via INTRAVENOUS
  Administered 2016-10-26: 7 mg/h via INTRAVENOUS
  Filled 2016-10-23 (×7): qty 100

## 2016-10-23 NOTE — Progress Notes (Signed)
Pt remains afib 150s. Card gtt increased to 10mg /hr per order.  BP stable.  Pt asymptomatic.  Will monitor for effect.

## 2016-10-23 NOTE — Progress Notes (Signed)
Pt remains in afib 130s BP stable, pt asymptomatic.  New order for IV Digoxin noted and given.  Card gtt remains at 10mg /hr.  Will monitor for effect.

## 2016-10-23 NOTE — Progress Notes (Addendum)
PROGRESS NOTE    [name] [name]   [id number]  DOB: 1941-03-27  DOA: 10/18/2016 PCP: [name], MD   Brief Narrative:  [name] a 76 y.o.malewith medical history of hypertension, bicuspid aortic valve,dyslipidemia, proximal A. Fib. Presented with cough of about 1 week duration productive of yellowish-green sputum, Fever starting on Saturday of 100-103 degree Fahrenheit, with malaise &poor appetite for the past week. Chest x-ray will possible pneumonia or mass or aneurysm. Patient was started on ceftriaxone and azithromycin and given 3 L bolus of IV fluids.  Subjective: Per his wife, his cough seems to be improving. He is still burning when he urinates.   Assessment & Plan:   Principal Problem:   CAP (community acquired pneumonia)- Sepsis- left pleural effusion - fever and hemoptysis-  CT chest to further evaluate mass like opacity reveals extensive pneumonia and pleural effusion- have called PCCM- they have ordered decubitus films today and done a thoracentesis- 1 L of blood fluid drained form left lung and found to be parapneumonic - strep Pneumo + >> cont Rocephin - d/c Zithromax - Tussionex, Tessalon, Mucinex, Delsym and Nebs Q 6 routine for severe cough and congestion  Active Problems: A-fib with RVR - began again last night when he ambulated to the bathroom - not improved with Cardizem infusion or Dig - Although he states he is urinating frequently, I feel he may be dehydrated which is driving his HR up and causing burning urination  - will order 1 L NS bolus and continuous fluids -- cont Metoprolol- holding Eliquis due to hemoptysis - patient constantly stating it is "poison"- per cardiology notes, he has refused anticoagulation in the past   B/l hearing loss - improving with Flonase and Afrin  Urinary frequency with Burning - UA negative for infection, Urelle not helping - add Flomax today and start IVF    AKI (acute kidney injury) (HCC) - prerenal-  holding Ramipril and HCTZ - Cr 2.84 >>> 1.32 >>> 0.95   Mildly elevated Troponin/ Ao stenosis with bicuspid Ao Valve - likely due to resp distress - checked ECHO which reveals mod Ao stenosis- not significantly changed from prior    Essential hypertension - holding Ramipril, HCTZ- cont Metoprolol    Ascending aortic aneurysm/   Bicuspid aortic valve - follow   DVT prophylaxis: Lovenox Code Status: Full code Family Communication:  Disposition Plan: SNF?  Consultants:   Pulmonary Procedures:  2 d ECHO 2D imaging suggests moderate aortic stenosis. Transaortic   gradients were much higher on the study from April 2017. Suspect   aortic valve gradients are underestimated on the current study   due to poor beam alignment.  Antimicrobials:  Anti-infectives    Start     Dose/Rate Route Frequency Ordered Stop   10/20/16 1000  azithromycin (ZITHROMAX) tablet 500 mg  Status:  Discontinued     500 mg Oral Daily 10/20/16 0813 10/22/16 1111   10/19/16 1200  azithromycin (ZITHROMAX) 500 mg in dextrose 5 % 250 mL IVPB  Status:  Discontinued     500 mg 250 mL/hr over 60 Minutes Intravenous Every 24 hours 10/18/16 1741 10/20/16 0813   10/19/16 1000  cefTRIAXone (ROCEPHIN) 1 g in dextrose 5 % 50 mL IVPB     1 g 100 mL/hr over 30 Minutes Intravenous Every 24 hours 10/18/16 1741     10/18/16 1345  oseltamivir (TAMIFLU) capsule 75 mg  Status:  Discontinued     75 mg Oral  Once 10/18/16 1340 10/18/16 1342  10/18/16 1345  oseltamivir (TAMIFLU) capsule 30 mg  Status:  Discontinued     30 mg Oral  Once 10/18/16 1342 10/18/16 1646   10/18/16 1315  oseltamivir (TAMIFLU) capsule 30 mg  Status:  Discontinued     30 mg Oral Once 10/18/16 1300 10/18/16 1340   10/18/16 1142  azithromycin (ZITHROMAX) 500 MG injection    Comments:  Peel, Adrienne   : cabinet override      10/18/16 1142 10/18/16 2344   10/18/16 1133  azithromycin (ZITHROMAX) 500 MG injection  Status:  Discontinued    Comments:  Peel,  Adrienne   : cabinet override      10/18/16 1133 10/18/16 1144   10/18/16 1130  cefTRIAXone (ROCEPHIN) 1 g in dextrose 5 % 50 mL IVPB     1 g 100 mL/hr over 30 Minutes Intravenous  Once 10/18/16 1127 10/18/16 1227   10/18/16 1130  azithromycin (ZITHROMAX) 500 mg in dextrose 5 % 250 mL IVPB     500 mg 250 mL/hr over 60 Minutes Intravenous  Once 10/18/16 1127 10/18/16 1337       Objective: Vitals:   10/23/16 0855 10/23/16 0915 10/23/16 1012 10/23/16 1153  BP:  134/79  116/73  Pulse: 89 (!) 122 (!) 121 (!) 108  Resp: 18 18  18   Temp:      TempSrc:      SpO2: 94% 96% 96% 96%  Weight:      Height:        Intake/Output Summary (Last 24 hours) at 10/23/16 1232 Last data filed at 10/23/16 1042  Gross per 24 hour  Intake              688 ml  Output             1550 ml  Net             -862 ml   Filed Weights   10/18/16 0859  Weight: 86.2 kg (190 lb)    Examination: General exam: Appears comfortable  HEENT: PERRLA, oral mucosa moist, no sclera icterus or thrush Respiratory system: mild rhonchi,  + severe cough, Respiratory effort normal. 96% on room air today Cardiovascular system: S1 & S2 heard, IIRR.  No murmurs  Gastrointestinal system: Abdomen soft, non-tender, nondistended. Normal bowel sound. No organomegaly Central nervous system: Alert and oriented. No focal neurological deficits. Extremities: No cyanosis, clubbing or edema Skin: small abrasions on back   Psychiatry:  Mood & affect appropriate.     Data Reviewed: I have personally reviewed following labs and imaging studies  CBC:  Recent Labs Lab 10/18/16 1000 10/19/16 0639 10/22/16 0525  WBC 12.5* 8.8 6.6  NEUTROABS 11.8*  --   --   HGB 12.1* 10.1* 10.9*  HCT 35.8* 29.9* 32.4*  MCV 92.5 90.9 93.1  PLT 138* 131* [id number]   Basic Metabolic Panel:  Recent Labs Lab 10/19/16 0639 10/20/16 0359 10/21/16 0530 10/22/16 0525 10/23/16 0506  NA 136 139 141 138 138  K 3.5 3.4* 3.6 3.1* 3.5  CL 104 109 109  105 106  CO2 24 24 25 25 25   GLUCOSE 112* 105* 107* 100* 115*  BUN 53* 37* 25* 19 15  CREATININE 2.17* 1.32* 0.95 0.85 0.77  CALCIUM 7.9* 8.1* 8.2* 7.9* 8.2*   GFR: Estimated Creatinine Clearance: 83.7 mL/min (by C-G formula based on SCr of 0.77 mg/dL). Liver Function Tests:  Recent Labs Lab 10/18/16 1000 10/22/16 1117  AST 46*  --   ALT 28  --  ALKPHOS 52  --   BILITOT 1.4*  --   PROT 8.2* 6.7  ALBUMIN 3.5  --    No results for input(s): LIPASE, AMYLASE in the last 168 hours. No results for input(s): AMMONIA in the last 168 hours. Coagulation Profile: No results for input(s): INR, PROTIME in the last 168 hours. Cardiac Enzymes:  Recent Labs Lab 10/18/16 1000 10/18/16 1830 10/18/16 2320 10/19/16 0639  TROPONINI 0.11* 0.07* 0.07* 0.06*   BNP (last 3 results) No results for input(s): PROBNP in the last 8760 hours. HbA1C: No results for input(s): HGBA1C in the last 72 hours. CBG: No results for input(s): GLUCAP in the last 168 hours. Lipid Profile: No results for input(s): CHOL, HDL, LDLCALC, TRIG, CHOLHDL, LDLDIRECT in the last 72 hours. Thyroid Function Tests: No results for input(s): TSH, T4TOTAL, FREET4, T3FREE, THYROIDAB in the last 72 hours. Anemia Panel: No results for input(s): VITAMINB12, FOLATE, FERRITIN, TIBC, IRON, RETICCTPCT in the last 72 hours. Urine analysis:    Component Value Date/Time   COLORURINE YELLOW 10/21/2016 2355   APPEARANCEUR HAZY (A) 10/21/2016 2355   LABSPEC 1.017 10/21/2016 2355   PHURINE 5.0 10/21/2016 2355   GLUCOSEU NEGATIVE 10/21/2016 2355   GLUCOSEU NEGATIVE 11/19/2015 1352   HGBUR SMALL (A) 10/21/2016 2355   BILIRUBINUR NEGATIVE 10/21/2016 2355   KETONESUR 5 (A) 10/21/2016 2355   PROTEINUR NEGATIVE 10/21/2016 2355   UROBILINOGEN 0.2 11/19/2015 1352   NITRITE NEGATIVE 10/21/2016 2355   LEUKOCYTESUR NEGATIVE 10/21/2016 2355   Sepsis Labs: @LABRCNTIP (procalcitonin:4,lacticidven:4) ) Recent Results (from the past 240  hour(s))  MRSA PCR Screening     Status: None   Collection Time: 10/18/16  3:38 PM  Result Value Ref Range Status   MRSA by PCR NEGATIVE NEGATIVE Final    Comment:        The GeneXpert MRSA Assay (FDA approved for NASAL specimens only), is one component of a comprehensive MRSA colonization surveillance program. It is not intended to diagnose MRSA infection nor to guide or monitor treatment for MRSA infections.   Culture, blood (Routine X 2) w Reflex to ID Panel     Status: None   Collection Time: 10/18/16  6:29 PM  Result Value Ref Range Status   Specimen Description BLOOD BLOOD RIGHT FOREARM  Final   Special Requests IN PEDIATRIC BOTTLE 4 CC  Final   Culture   Final    NO GROWTH 5 DAYS Performed at [facility], [place] [address], [place], [place] [address]    Report Status 10/23/2016 FINAL  Final  Culture, blood (Routine X 2) w Reflex to ID Panel     Status: None   Collection Time: 10/18/16  6:29 PM  Result Value Ref Range Status   Specimen Description BLOOD LEFT ANTECUBITAL  Final   Special Requests BOTTLES DRAWN AEROBIC ONLY 5 CC  Final   Culture   Final    NO GROWTH 5 DAYS Performed at [facility], [place] [address], [place], [place] [address]    Report Status 10/23/2016 FINAL  Final  Body fluid culture     Status: None (Preliminary result)   Collection Time: 10/22/16 11:25 AM  Result Value Ref Range Status   Specimen Description PLEURAL LEFT  Final   Special Requests NONE  Final   Gram Stain   Final    RARE WBC PRESENT, PREDOMINANTLY MONONUCLEAR NO ORGANISMS SEEN    Culture   Final    NO GROWTH < 24 HOURS Performed at [facility], [place] [address] [place], [place] [address]    Report Status PENDING  Incomplete         Radiology Studies: Dg Chest 2 View  Result Date: 10/22/2016 CLINICAL DATA:  Pneumonia. EXAM: CHEST  2 VIEW COMPARISON:  Radiographs of October 18, 2016. CT scan of October 30, 2016. FINDINGS: Stable cardiomediastinal  silhouette. No pneumothorax is noted. Increased mild right basilar opacity is noted concerning for atelectasis or infiltrate. Stable left upper lobe opacity is noted consistent with pneumonia based on prior CT scan. Increased left basilar opacity is noted concerning for worsening atelectasis or infiltrate with associated pleural effusion. Bony thorax is unremarkable. IMPRESSION: Stable left upper lobe opacity is noted consistent with pneumonia based on prior CT scan. Mildly increased right basilar opacity is noted concerning for atelectasis or infiltrate. Increased left basilar opacity is noted as described above. Electronically Signed   By: Marijo Conception, M.D.   On: 10/22/2016 09:48   Dg Chest Bilateral Decubitus  Result Date: 10/22/2016 CLINICAL DATA:  Pleural effusion. EXAM: CHEST - BILATERAL DECUBITUS VIEW COMPARISON:  Radiographs of October 18, 2016. FINDINGS: Moderate free flowing left pleural effusion is noted. Mild free flowing right pleural effusion is noted. IMPRESSION: Free-flowing bilateral pleural effusions as described above. Electronically Signed   By: Marijo Conception, M.D.   On: 10/22/2016 09:45   Dg Chest Port 1 View  Result Date: 10/22/2016 CLINICAL DATA:  Status post thoracentesis. EXAM: PORTABLE CHEST 1 VIEW COMPARISON:  October 22, 2016 FINDINGS: There is infiltrate in the left upper lobe which is stable. Opacity in the right base is slightly increased. No other changes. No pneumothorax. IMPRESSION: Stable left upper lobe infiltrate. Slight increase in right basilar opacity. Electronically Signed   By: Dorise Bullion III M.D   On: 10/22/2016 11:24      Scheduled Meds: . benzonatate  200 mg Oral TID  . cefTRIAXone (ROCEPHIN)  IV  1 g Intravenous Q24H  . chlorpheniramine-HYDROcodone  5 mL Oral Q8H  . cholecalciferol  1,000 Units Oral Daily  . dextromethorphan  60 mg Oral BID  . diclofenac sodium  2 g Topical QID  . diltiazem  240 mg Oral Daily  . fluticasone  2 spray Each  Nare Daily  . ipratropium  0.5 mg Nebulization TID  . levalbuterol  1.25 mg Nebulization TID  . mouth rinse  15 mL Mouth Rinse BID  . metoprolol succinate  50 mg Oral Daily  . oxymetazoline  1 spray Each Nare BID  . sodium chloride  1,000 mL Intravenous Once  . tamsulosin  0.4 mg Oral Daily   Continuous Infusions: . sodium chloride    . diltiazem (CARDIZEM) infusion 5 mg/hr (10/23/16 1155)     LOS: 5 days    Time spent in minutes: 38    [place], MD [facility] Pager: [email or web address] Password [email or web address] 10/23/2016, 12:32 PM

## 2016-10-23 NOTE — Progress Notes (Signed)
Pt continues to have a rate over 150 despite total of 30 mg  Of cardizem. MD smith paged.

## 2016-10-23 NOTE — Progress Notes (Signed)
Name: [name] [name] MRN: [id number] DOB: 11/19/1940    ADMISSION DATE:  10/18/2016 CONSULTATION DATE:  2/8  REFERRING MD : [facility]  CHIEF COMPLAINT:  Cough   HISTORY OF PRESENT ILLNESS:   76 yo retired [place] from [place] , never smoker, with PAF and on Eliquis,who presented 10/18/16 with 1 week of cough, green yellow sputum, fever 103, 10 lb weight loss coupled with poor appetite and general malaise. Admitted and treated for CAP with abx and on ct scan noted to have left > right effusions and PCCM called to consult.   SUBJECTIVE:  Developed AF- RVR overnight, on cardizem gtt Frequent cough Afebrile , dyspneic on minimal activity   VITAL SIGNS: Temp:  [98.3 F (36.8 C)-98.4 F (36.9 C)] 98.3 F (36.8 C) (02/10 0549) Pulse Rate:  [89-164] 122 (02/10 0915) Resp:  [18] 18 (02/10 0915) BP: (127-139)/(76-89) 134/79 (02/10 0915) SpO2:  [94 %-99 %] 96 % (02/10 0915)  PHYSICAL EXAMINATION: General:  Male in no acute distress. Neuro:  Non focal, interactive HEENT: No JVD/LAN, hard of hearing Cardiovascular:  HSR RRR Lungs:  Diminished left base Abdomen:  Soft non tender Musculoskeletal:  intact Skin:  Warm and dry   Recent Labs Lab 10/21/16 0530 10/22/16 0525 10/23/16 0506  NA 141 138 138  K 3.6 3.1* 3.5  CL 109 105 106  CO2 25 25 25   BUN 25* 19 15  CREATININE 0.95 0.85 0.77  GLUCOSE 107* 100* 115*    Recent Labs Lab 10/18/16 1000 10/19/16 0639 10/22/16 0525  HGB 12.1* 10.1* 10.9*  HCT 35.8* 29.9* 32.4*  WBC 12.5* 8.8 6.6  PLT 138* 131* 279   Dg Chest 2 View  Result Date: 10/22/2016 CLINICAL DATA:  Pneumonia. EXAM: CHEST  2 VIEW COMPARISON:  Radiographs of October 18, 2016. CT scan of October 30, 2016. FINDINGS: Stable cardiomediastinal silhouette. No pneumothorax is noted. Increased mild right basilar opacity is noted concerning for atelectasis or infiltrate. Stable left upper lobe opacity is noted consistent with pneumonia based on prior CT scan.  Increased left basilar opacity is noted concerning for worsening atelectasis or infiltrate with associated pleural effusion. Bony thorax is unremarkable. IMPRESSION: Stable left upper lobe opacity is noted consistent with pneumonia based on prior CT scan. Mildly increased right basilar opacity is noted concerning for atelectasis or infiltrate. Increased left basilar opacity is noted as described above. Electronically Signed   By: Marijo Conception, M.D.   On: 10/22/2016 09:48   Dg Chest Bilateral Decubitus  Result Date: 10/22/2016 CLINICAL DATA:  Pleural effusion. EXAM: CHEST - BILATERAL DECUBITUS VIEW COMPARISON:  Radiographs of October 18, 2016. FINDINGS: Moderate free flowing left pleural effusion is noted. Mild free flowing right pleural effusion is noted. IMPRESSION: Free-flowing bilateral pleural effusions as described above. Electronically Signed   By: Marijo Conception, M.D.   On: 10/22/2016 09:45   Dg Chest Port 1 View  Result Date: 10/22/2016 CLINICAL DATA:  Status post thoracentesis. EXAM: PORTABLE CHEST 1 VIEW COMPARISON:  October 22, 2016 FINDINGS: There is infiltrate in the left upper lobe which is stable. Opacity in the right base is slightly increased. No other changes. No pneumothorax. IMPRESSION: Stable left upper lobe infiltrate. Slight increase in right basilar opacity. Electronically Signed   By: Dorise Bullion III M.D   On: 10/22/2016 11:24    ASSESSMENT:   Principal Problem:   Left >right parapneumonic effusion    Hemoptysis   CAP (community acquired pneumonia) -Urine strep ag pos >>  confirms pneumococcal    Discussion: Fluid appears parapneumonic  - does not meet empyema criteria Cough main issue   PLAN:  Will need intermittent CXR FU to resolution  ct ceftx - Eventually levaquin once clinically improved Antitussives   AF-RVR - cardizem gtt, Po being titrated OK for  IV heparin  PCCM will see again on Monday  Kara Mead MD. [facility]. [facility] Pager [phone] If no response call [phone]     10/23/2016, 9:18 AM

## 2016-10-23 NOTE — Progress Notes (Addendum)
Pt got up to go to the bathroom and HR went up to the 160s (still a-fib). Pt asymptomatic but brought back to lay down. Still sustained this rate so prn diltiazem given, BP stable. Will continue to monitor.   MD Tamala Julian gave verbal order to give another dose of cardizem and again in 30 min if HR sustains over 150.

## 2016-10-24 ENCOUNTER — Encounter (HOSPITAL_COMMUNITY): Payer: Self-pay | Admitting: Cardiology

## 2016-10-24 ENCOUNTER — Inpatient Hospital Stay (HOSPITAL_COMMUNITY): Payer: Medicare Other

## 2016-10-24 DIAGNOSIS — I4891 Unspecified atrial fibrillation: Secondary | ICD-10-CM

## 2016-10-24 LAB — CBC
HCT: 34.6 % — ABNORMAL LOW (ref 39.0–52.0)
Hemoglobin: 11.7 g/dL — ABNORMAL LOW (ref 13.0–17.0)
MCH: 31.2 pg (ref 26.0–34.0)
MCHC: 33.8 g/dL (ref 30.0–36.0)
MCV: 92.3 fL (ref 78.0–100.0)
Platelets: 387 10*3/uL (ref 150–400)
RBC: 3.75 MIL/uL — ABNORMAL LOW (ref 4.22–5.81)
RDW: 13.3 % (ref 11.5–15.5)
WBC: 5.8 10*3/uL (ref 4.0–10.5)

## 2016-10-24 LAB — BASIC METABOLIC PANEL
Anion gap: 5 (ref 5–15)
BUN: 14 mg/dL (ref 6–20)
CO2: 28 mmol/L (ref 22–32)
Calcium: 7.8 mg/dL — ABNORMAL LOW (ref 8.9–10.3)
Chloride: 107 mmol/L (ref 101–111)
Creatinine, Ser: 0.68 mg/dL (ref 0.61–1.24)
GFR calc Af Amer: >60 mL/min (ref >60)
GFR calc non Af Amer: >60 mL/min (ref >60)
Glucose, Bld: 110 mg/dL — ABNORMAL HIGH (ref 65–99)
Potassium: 3.4 mmol/L — ABNORMAL LOW (ref 3.5–5.1)
Sodium: 140 mmol/L (ref 135–145)

## 2016-10-24 MED ORDER — DILTIAZEM HCL 25 MG/5ML IV SOLN
5.0000 mg | Freq: Once | INTRAVENOUS | Status: AC
Start: 2016-10-24 — End: 2016-10-24
  Administered 2016-10-24: 5 mg via INTRAVENOUS
  Filled 2016-10-24: qty 5

## 2016-10-24 MED ORDER — DIGOXIN 125 MCG PO TABS
0.1250 mg | ORAL_TABLET | Freq: Every day | ORAL | Status: DC
  Administered 2016-10-24 – 2016-10-27 (×4): 0.125 mg via ORAL
  Filled 2016-10-24 (×4): qty 1

## 2016-10-24 MED ORDER — APIXABAN 5 MG PO TABS
5.0000 mg | ORAL_TABLET | Freq: Two times a day (BID) | ORAL | Status: DC
  Administered 2016-10-24 – 2016-10-26 (×5): 5 mg via ORAL
  Filled 2016-10-24 (×5): qty 1

## 2016-10-24 MED ORDER — POLYETHYLENE GLYCOL 3350 17 G PO PACK
17.0000 g | PACK | Freq: Every day | ORAL | Status: DC | PRN
  Administered 2016-10-24 – 2016-10-27 (×2): 17 g via ORAL
  Filled 2016-10-24 (×3): qty 1

## 2016-10-24 MED ORDER — AMIODARONE IV BOLUS ONLY 150 MG/100ML: 150.0000 mg | Freq: Once | INTRAVENOUS | Status: DC

## 2016-10-24 MED ORDER — AMIODARONE IV BOLUS ONLY 150 MG/100ML
150.0000 mg | Freq: Once | INTRAVENOUS | Status: AC
  Administered 2016-10-24: 150 mg via INTRAVENOUS
  Filled 2016-10-24: qty 100

## 2016-10-24 MED ORDER — DILTIAZEM HCL 25 MG/5ML IV SOLN: 5.0000 mg | Freq: Once | INTRAVENOUS | Status: DC

## 2016-10-24 MED ORDER — BISACODYL 10 MG RE SUPP
10.0000 mg | Freq: Once | RECTAL | Status: AC
  Administered 2016-10-24: 10 mg via RECTAL
  Filled 2016-10-24: qty 1

## 2016-10-24 MED ORDER — POTASSIUM CHLORIDE CRYS ER 20 MEQ PO TBCR
40.0000 meq | EXTENDED_RELEASE_TABLET | ORAL | Status: AC
Start: 2016-10-24 — End: 2016-10-24
  Administered 2016-10-24 (×2): 40 meq via ORAL
  Filled 2016-10-24 (×2): qty 2

## 2016-10-24 MED ORDER — METOPROLOL SUCCINATE ER 50 MG PO TB24
50.0000 mg | ORAL_TABLET | Freq: Two times a day (BID) | ORAL | Status: DC
  Administered 2016-10-24: 50 mg via ORAL

## 2016-10-24 MED ORDER — DILTIAZEM LOAD VIA INFUSION
5.0000 mg | Freq: Once | INTRAVENOUS | Status: DC
  Filled 2016-10-24: qty 5

## 2016-10-24 NOTE — Progress Notes (Addendum)
PROGRESS NOTE    [name] [name]   [id number]  DOB: [birth year]  DOA: 10/18/2016 PCP: [name], MD   Brief Narrative:  [name] [name] a 76 y.o.malewith medical history of hypertension, bicuspid aortic valve,dyslipidemia, proximal A. Fib. Presented with cough of about 1 week duration productive of yellowish-green sputum, Fever starting on Saturday of 100-103 degree Fahrenheit, with malaise &poor appetite for the past week. Chest x-ray will possible pneumonia or mass or aneurysm. Patient was started on ceftriaxone and azithromycin and given 3 L bolus of IV fluids.  Subjective: Cough is dry- felt like he was going to die last night due to cough- no hemoptysis yesterday or today- burning micturition better but not gone.   Assessment & Plan:   Principal Problem:   CAP (community acquired pneumonia)- Sepsis- left pleural effusion - fever and hemoptysis-  CT chest to further evaluate mass like opacity reveals extensive pneumonia and pleural effusion- have called PCCM- they have ordered decubitus films today and done a thoracentesis- 1 L of blood fluid drained form left lung and found to be parapneumonic - strep Pneumo + >> cont Rocephin - d/c Zithromax - Tussionex, Tessalon, Mucinex, Delsym for severe cough and congestion- hold NEBS today as HR is uncontrolled  Active Problems: A-fib with RVR - began again when he ambulated to the bathroom - not improved with Cardizem infusion, Metoprolol, Oral cardizem and Dig - not improved with 1 L NS bolus and continuous fluids - not improved with Amio infusion 150 mg  - will call cardiology today - have been holding Eliquis due to hemoptysis - will resume today as hemoptysis resolved - patient constantly stating it is "poison"- per cardiology notes, he has refused anticoagulation in the past  Hypokalemia - replacing   B/l hearing loss - improving with Flonase and Afrin  Urinary frequency with Burning - UA negative for infection,  Urelle not helping - added Flomax  - started IVF for concentrated urine and poor oral intake - had 2 L urine output yesterday- will slow fluid down    AKI (acute kidney injury) (HCC) - prerenal- holding Ramipril and HCTZ - Cr 2.84 >>> 1.32 >>> 0.95   Mildly elevated Troponin/ Ao stenosis with bicuspid Ao Valve - likely due to resp distress - checked ECHO which reveals mod Ao stenosis- not significantly changed from prior    Essential hypertension - holding Ramipril, HCTZ- cont Metoprolol    Ascending aortic aneurysm/   Bicuspid aortic valve - follow   DVT prophylaxis: Lovenox Code Status: Full code Family Communication:  Disposition Plan: SNF?  Consultants:   Pulmonary Procedures:  2 d ECHO 2D imaging suggests moderate aortic stenosis. Transaortic   gradients were much higher on the study from April 2017. Suspect   aortic valve gradients are underestimated on the current study   due to poor beam alignment.  Antimicrobials:  Anti-infectives    Start     Dose/Rate Route Frequency Ordered Stop   10/20/16 1000  azithromycin (ZITHROMAX) tablet 500 mg  Status:  Discontinued     500 mg Oral Daily 10/20/16 0813 10/22/16 1111   10/19/16 1200  azithromycin (ZITHROMAX) 500 mg in dextrose 5 % 250 mL IVPB  Status:  Discontinued     500 mg 250 mL/hr over 60 Minutes Intravenous Every 24 hours 10/18/16 1741 10/20/16 0813   10/19/16 1000  cefTRIAXone (ROCEPHIN) 1 g in dextrose 5 % 50 mL IVPB     1 g 100 mL/hr over 30 Minutes Intravenous Every 24 hours  10/18/16 1741     10/18/16 1345  oseltamivir (TAMIFLU) capsule 75 mg  Status:  Discontinued     75 mg Oral  Once 10/18/16 1340 10/18/16 1342   10/18/16 1345  oseltamivir (TAMIFLU) capsule 30 mg  Status:  Discontinued     30 mg Oral  Once 10/18/16 1342 10/18/16 1646   10/18/16 1315  oseltamivir (TAMIFLU) capsule 30 mg  Status:  Discontinued     30 mg Oral Once 10/18/16 1300 10/18/16 1340   10/18/16 1142  azithromycin (ZITHROMAX) 500 MG  injection    Comments:  [name] [name]   : cabinet override      10/18/16 1142 10/18/16 2344   10/18/16 1133  azithromycin (ZITHROMAX) 500 MG injection  Status:  Discontinued    Comments:  [name] [name]   : cabinet override      10/18/16 1133 10/18/16 1144   10/18/16 1130  cefTRIAXone (ROCEPHIN) 1 g in dextrose 5 % 50 mL IVPB     1 g 100 mL/hr over 30 Minutes Intravenous  Once 10/18/16 1127 10/18/16 1227   10/18/16 1130  azithromycin (ZITHROMAX) 500 mg in dextrose 5 % 250 mL IVPB     500 mg 250 mL/hr over 60 Minutes Intravenous  Once 10/18/16 1127 10/18/16 1337       Objective: Vitals:   10/24/16 0148 10/24/16 0317 10/24/16 0450 10/24/16 0643  BP:  130/85 (!) 121/99 (!) 119/95  Pulse:  (!) 132 (!) 143 (!) 166  Resp: 20 20    Temp: 98.2 F (36.8 C) 99.1 F (37.3 C)    TempSrc: Oral Oral    SpO2: 92% 94% 97%   Weight:      Height:        Intake/Output Summary (Last 24 hours) at 10/24/16 1000 Last data filed at 10/24/16 0827  Gross per 24 hour  Intake          1554.88 ml  Output             1740 ml  Net          -185.12 ml   Filed Weights   10/18/16 0859  Weight: 86.2 kg (190 lb)    Examination: General exam: Appears comfortable  HEENT: PERRLA, oral mucosa moist, no sclera icterus or thrush Respiratory system: mild rhonchi,  + severe cough, Respiratory effort normal. 96% on room air today Cardiovascular system: S1 & S2 heard, IIRR.  No murmurs  Gastrointestinal system: Abdomen soft, non-tender, nondistended. Normal bowel sound. No organomegaly Central nervous system: Alert and oriented. No focal neurological deficits. Extremities: No cyanosis, clubbing or edema Skin: small abrasions on back   Psychiatry:  Mood & affect appropriate.     Data Reviewed: I have personally reviewed following labs and imaging studies  CBC:  Recent Labs Lab 10/18/16 1000 10/19/16 0639 10/22/16 0525 10/24/16 0433  WBC 12.5* 8.8 6.6 5.8  NEUTROABS 11.8*  --   --   --   HGB  12.1* 10.1* 10.9* 11.7*  HCT 35.8* 29.9* 32.4* 34.6*  MCV 92.5 90.9 93.1 92.3  PLT 138* 131* 279 [id number]   Basic Metabolic Panel:  Recent Labs Lab 10/20/16 0359 10/21/16 0530 10/22/16 0525 10/23/16 0506 10/24/16 0433  NA 139 141 138 138 140  K 3.4* 3.6 3.1* 3.5 3.4*  CL 109 109 105 106 107  CO2 24 25 25 25 28   GLUCOSE 105* 107* 100* 115* 110*  BUN 37* 25* 19 15 14   CREATININE 1.32* 0.95 0.85 0.77 0.68  CALCIUM 8.1* 8.2* 7.9* 8.2* 7.8*   GFR: Estimated Creatinine Clearance: 83.7 mL/min (by C-G formula based on SCr of 0.68 mg/dL). Liver Function Tests:  Recent Labs Lab 10/18/16 1000 10/22/16 1117  AST 46*  --   ALT 28  --   ALKPHOS 52  --   BILITOT 1.4*  --   PROT 8.2* 6.7  ALBUMIN 3.5  --    No results for input(s): LIPASE, AMYLASE in the last 168 hours. No results for input(s): AMMONIA in the last 168 hours. Coagulation Profile: No results for input(s): INR, PROTIME in the last 168 hours. Cardiac Enzymes:  Recent Labs Lab 10/18/16 1000 10/18/16 1830 10/18/16 2320 10/19/16 0639  TROPONINI 0.11* 0.07* 0.07* 0.06*   BNP (last 3 results) No results for input(s): PROBNP in the last 8760 hours. HbA1C: No results for input(s): HGBA1C in the last 72 hours. CBG: No results for input(s): GLUCAP in the last 168 hours. Lipid Profile: No results for input(s): CHOL, HDL, LDLCALC, TRIG, CHOLHDL, LDLDIRECT in the last 72 hours. Thyroid Function Tests: No results for input(s): TSH, T4TOTAL, FREET4, T3FREE, THYROIDAB in the last 72 hours. Anemia Panel: No results for input(s): VITAMINB12, FOLATE, FERRITIN, TIBC, IRON, RETICCTPCT in the last 72 hours. Urine analysis:    Component Value Date/Time   COLORURINE YELLOW 10/21/2016 2355   APPEARANCEUR HAZY (A) 10/21/2016 2355   LABSPEC 1.017 10/21/2016 2355   PHURINE 5.0 10/21/2016 2355   GLUCOSEU NEGATIVE 10/21/2016 2355   GLUCOSEU NEGATIVE 11/19/2015 1352   HGBUR SMALL (A) 10/21/2016 2355   BILIRUBINUR NEGATIVE  10/21/2016 2355   KETONESUR 5 (A) 10/21/2016 2355   PROTEINUR NEGATIVE 10/21/2016 2355   UROBILINOGEN 0.2 11/19/2015 1352   NITRITE NEGATIVE 10/21/2016 2355   LEUKOCYTESUR NEGATIVE 10/21/2016 2355   Sepsis Labs: @LABRCNTIP (procalcitonin:4,lacticidven:4) ) Recent Results (from the past 240 hour(s))  MRSA PCR Screening     Status: None   Collection Time: 10/18/16  3:38 PM  Result Value Ref Range Status   MRSA by PCR NEGATIVE NEGATIVE Final    Comment:        The GeneXpert MRSA Assay (FDA approved for NASAL specimens only), is one component of a comprehensive MRSA colonization surveillance program. It is not intended to diagnose MRSA infection nor to guide or monitor treatment for MRSA infections.   Culture, blood (Routine X 2) w Reflex to ID Panel     Status: None   Collection Time: 10/18/16  6:29 PM  Result Value Ref Range Status   Specimen Description BLOOD BLOOD RIGHT FOREARM  Final   Special Requests IN PEDIATRIC BOTTLE 4 CC  Final   Culture   Final    NO GROWTH 5 DAYS Performed at [facility], [place] [address] [place], [place] [address]    Report Status 10/23/2016 FINAL  Final  Culture, blood (Routine X 2) w Reflex to ID Panel     Status: None   Collection Time: 10/18/16  6:29 PM  Result Value Ref Range Status   Specimen Description BLOOD LEFT ANTECUBITAL  Final   Special Requests BOTTLES DRAWN AEROBIC ONLY 5 CC  Final   Culture   Final    NO GROWTH 5 DAYS Performed at [facility], [place] [address] [place], [place] [address]    Report Status 10/23/2016 FINAL  Final  Body fluid culture     Status: None (Preliminary result)   Collection Time: 10/22/16 11:25 AM  Result Value Ref Range Status   Specimen Description  PLEURAL LEFT  Final   Special Requests NONE  Final   Gram Stain   Final    RARE WBC PRESENT, PREDOMINANTLY MONONUCLEAR NO ORGANISMS SEEN    Culture   Final    NO GROWTH 2 DAYS Performed at [facility], [address]  [place], [place] [address]    Report Status PENDING  Incomplete         Radiology Studies: Dg Chest Port 1 View  Result Date: 10/22/2016 CLINICAL DATA:  Status post thoracentesis. EXAM: PORTABLE CHEST 1 VIEW COMPARISON:  October 22, 2016 FINDINGS: There is infiltrate in the left upper lobe which is stable. Opacity in the right base is slightly increased. No other changes. No pneumothorax. IMPRESSION: Stable left upper lobe infiltrate. Slight increase in right basilar opacity. Electronically Signed   By: Dorise Bullion III M.D   On: 10/22/2016 11:24      Scheduled Meds: . benzonatate  200 mg Oral TID  . cefTRIAXone (ROCEPHIN)  IV  1 g Intravenous Q24H  . chlorpheniramine-HYDROcodone  5 mL Oral Q8H  . cholecalciferol  1,000 Units Oral Daily  . dextromethorphan  60 mg Oral BID  . diclofenac sodium  2 g Topical QID  . digoxin  0.125 mg Oral Daily  . diltiazem  240 mg Oral Daily  . fluticasone  2 spray Each Nare Daily  . mouth rinse  15 mL Mouth Rinse BID  . metoprolol succinate  50 mg Oral Daily  . oxymetazoline  1 spray Each Nare BID  . potassium chloride  40 mEq Oral Q4H  . tamsulosin  0.4 mg Oral Daily   Continuous Infusions: . sodium chloride 75 mL/hr at 10/24/16 0150  . diltiazem (CARDIZEM) infusion 10 mg/hr (10/24/16 0655)     LOS: 6 days    Time spent in minutes: 8    [place], MD [facility] Pager: [email or web address] Password [email or web address] 10/24/2016, 10:00 AM

## 2016-10-24 NOTE — Progress Notes (Signed)
PT Cancellation Note  Patient Details Name: [name] [name] MRN: [id number] DOB: 1941-01-24   Cancelled Treatment:     per chart review, will need to hold off on Physical Therapy for today.   Rica Koyanagi  PTA [facility]  Acute  Rehab Pager      [phone]

## 2016-10-24 NOTE — Progress Notes (Signed)
Patient's HR still a fib going up into the 160's. BP 124/99. On call notified and new orders were given to restart the cardizem drip at 5.

## 2016-10-24 NOTE — Consult Note (Signed)
Requesting provider: Dr. Debbe Odea Primary cardiologist: Dr. [name] Consulting cardiologist: Dr. Satira Sark  Reason for consultation: Atrial fibrillation  Clinical Summary Mr. Eddinger is a 76 y.o.male past medical history outlined below, currently admitted to the hospital with progressive productive cough over the last week and intermittent fevers, also associated with malaise and anorexia. He is being treated for community-acquired pneumonia, currently on Rocephin and cough suppressants. He was noted to go into rapid atrial fibrillation early morning February 10 when going to the bathroom. He has been treated since that time with intermittent IV Cardizem, also on oral metoprolol. I see that he was also given some amiodarone boluses as recently as today. We were consulted to assist with his management.  He has known PAF as detailed below. Prior history of intolerance to Xarelto, most recently taking Eliquis however this is been held with recent hemoptysis. He last saw Dr. Aundra Dubin in December 2017.  He remains in atrial fibrillation today, reports no distress. No chest pain. He has not been on antiarrhythmic therapy. Of note, he also has a bicuspid aortic valve with history of moderate aortic stenosis.   Allergies  Allergen Reactions  . Xarelto [Rivaroxaban] Other (See Comments)    INCREASED BP-HYPERTENSIVE EVENTS  . Benazepril Cough    Medications Scheduled Medications: . benzonatate  200 mg Oral TID  . cefTRIAXone (ROCEPHIN)  IV  1 g Intravenous Q24H  . chlorpheniramine-HYDROcodone  5 mL Oral Q8H  . cholecalciferol  1,000 Units Oral Daily  . dextromethorphan  60 mg Oral BID  . diclofenac sodium  2 g Topical QID  . digoxin  0.125 mg Oral Daily  . diltiazem  240 mg Oral Daily  . fluticasone  2 spray Each Nare Daily  . mouth rinse  15 mL Mouth Rinse BID  . metoprolol succinate  50 mg Oral Daily  . oxymetazoline  1 spray Each Nare BID  . potassium chloride  40 mEq  Oral Q4H  . tamsulosin  0.4 mg Oral Daily    Infusions: . sodium chloride 75 mL/hr at 10/24/16 0150  . diltiazem (CARDIZEM) infusion 10 mg/hr (10/24/16 0655)    PRN Medications: acetaminophen **OR** acetaminophen, albuterol, diltiazem, HYDROmorphone (DILAUDID) injection, menthol-cetylpyridinium, senna-docusate, URELLE   Past Medical History:  Diagnosis Date  . Aortic stenosis, mild    Echo 07/11: 55-60%, mild LV hypertrophy, mild aortic stenosis w/mean gradient 13mmHg and peak gradient 36 mmHg.  . Ascending aortic aneurysm ([facility])   . Bicuspid aortic valve   . Chest pain    ETT-myoview 12/11 w/exercise, no chest pain, no significant ST changes, EF 69%, no evidence for ischemia or infarction.  [name] HTN (hypertension)   . Hypercholesteremia   . LBP (low back pain)   . Moderate aortic stenosis   . Osteoarthritis   . Paroxysmal atrial fibrillation ([place])    a. new onset Afib in 07/2008. He underwent ibutilide cardioversion successfully. b. Recurrence 01/2013 s/p TEE/DCCV - was on Xarelto but he stopped it as he was convinced it was causing joint pn. c. Recurrence 01/2016 - spont conv to NSR. Pt took Eliquis x1 mo then declined further anticoag. d. Recurrence 07/2016.    Past Surgical History:  Procedure Laterality Date  . BACK SURGERY  x12 years ago  . CARDIOVERSION N/A 01/26/2013   Procedure: CARDIOVERSION;  Surgeon: Larey Dresser, MD;  Location: [facility];  Service: Cardiovascular;  Laterality: N/A;  . HEMORRHOID SURGERY    . LUMBAR LAMINECTOMY    .  TEE WITHOUT CARDIOVERSION N/A 01/26/2013   Procedure: TRANSESOPHAGEAL ECHOCARDIOGRAM (TEE);  Surgeon: Larey Dresser, MD;  Location[facility] ENDOSCOPY;  Service: Cardiovascular;  Laterality: N/A;    Family History  Problem Relation Age of Onset  . Colon cancer Mother 30  . Hypertension Other   . Coronary artery disease Neg Hx     Social History Mr. Dambra reports that he has never smoked. He has never used smokeless tobacco. Mr.  Weiher reports that he drinks alcohol.  Review of Systems Complete review of systems negative except as otherwise outlined in the clinical summary and also the following. Weakness, appetite slowly improving.  Physical Examination Blood pressure (!) 119/95, pulse (!) 166, temperature 99.1 F (37.3 C), temperature source Oral, resp. rate 20, height 5\' 11"  (1.803 m), weight 190 lb (86.2 kg), SpO2 97 %.  Intake/Output Summary (Last 24 hours) at 10/24/16 1031 Last data filed at 10/24/16 0827  Gross per 24 hour  Intake          1554.88 ml  Output             1740 ml  Net          -185.12 ml   Telemetry: Rapid atrial fibrillation.  Gen: No acute distress. HEENT: Conjunctiva and lids normal, oropharynx clear. Neck: Supple, no elevated JVP or carotid bruits, no thyromegaly. Lungs: Coarse breath sounds with scattered rhonchi and end expiratory wheeze, nonlabored breathing at rest. Cardiac: Irregularly irregular, no S3, 2/6 systolic murmur, no pericardial rub. Abdomen: Soft, nontender, bowel sounds present, no guarding or rebound. Extremities: No pitting edema, distal pulses 2+. Skin: Warm and dry. Musculoskeletal: No kyphosis. Neuropsychiatric: Alert and oriented x3, affect grossly appropriate.  Lab Results  Basic Metabolic Panel:  Recent Labs Lab 10/20/16 0359 10/21/16 0530 10/22/16 0525 10/23/16 0506 10/24/16 0433  NA 139 141 138 138 140  K 3.4* 3.6 3.1* 3.5 3.4*  CL 109 109 105 106 107  CO2 24 25 25 25 28   GLUCOSE 105* 107* 100* 115* 110*  BUN 37* 25* 19 15 14   CREATININE 1.32* 0.95 0.85 0.77 0.68  CALCIUM 8.1* 8.2* 7.9* 8.2* 7.8*    Liver Function Tests:  Recent Labs Lab 10/18/16 1000 10/22/16 1117  AST 46*  --   ALT 28  --   ALKPHOS 52  --   BILITOT 1.4*  --   PROT 8.2* 6.7  ALBUMIN 3.5  --     CBC:  Recent Labs Lab 10/18/16 1000 10/19/16 0639 10/22/16 0525 10/24/16 0433  WBC 12.5* 8.8 6.6 5.8  NEUTROABS 11.8*  --   --   --   HGB 12.1* 10.1* 10.9*  11.7*  HCT 35.8* 29.9* 32.4* 34.6*  MCV 92.5 90.9 93.1 92.3  PLT 138* 131* 279 387    Cardiac Enzymes:  Recent Labs Lab 10/18/16 1000 10/18/16 1830 10/18/16 2320 10/19/16 0639  TROPONINI 0.11* 0.07* 0.07* 0.06*    ECG I personally reviewed the tracing from 10/19/2016 which showed rapid atrial fibrillation with nonspecific ST changes.  Imaging  Chest CT 10/20/2016: IMPRESSION: 1. Consolidation and surrounding ground-glass in the left upper lobe is likely due to pneumonia. Followup PA and lateral chest X-ray is recommended in 3-4 weeks following trial of antibiotic therapy to ensure resolution and exclude underlying malignancy. 2. Moderate left pleural effusion with collapse/consolidation in the left lower lobe. 3. Small right pleural effusion with compressive atelectasis in the right lower lobe. 4. Suspect mild edema, raising concern for superimposed congestive heart failure. 5.  Aortic atherosclerosis (ICD10-170.0). Coronary artery and aortic valvular calcification.  Echocardiogram 10/21/2016: Study Conclusions  - Left ventricle: The cavity size was normal. There was mild   concentric hypertrophy. Systolic function was normal. The   estimated ejection fraction was in the range of 55% to 60%. Wall   motion was normal; there were no regional wall motion   abnormalities. - Aortic valve: Functionally bicuspid (fused right and left cusps);   moderately thickened, mildly calcified leaflets. There was   moderate stenosis by 2D imaging. The recorded Doppler velocities   are probably an underestimation of true transvalvular gradients.   There was trivial regurgitation. Valve area (VTI): 2.95 cm^2.   Valve area (Vmax): 2.51 cm^2. Valve area (Vmean): 2.39 cm^2. - Mitral valve: There was mild regurgitation directed centrally. - Left atrium: The atrium was moderately dilated. - Atrial septum: No defect or patent foramen ovale was identified.  Impressions:  - 2D imaging  suggests moderate aortic stenosis. Transaortic   gradients were much higher on the study from April 2017. Suspect   aortic valve gradients are underestimated on the current study   due to poor beam alignment.  Impression  1. History of paroxysmal atrial fibrillation with recurrence and RVR. Last prolonged episode was in November 2017. Up to this point he has been on Eliquis, now held per primary team with recent hemoptysis. He has not been on antiarrhythmic therapy. CHADSVASC score is 2.  2. Community acquired pneumonia, on antibiotics per primary team. Had some hemoptysis at presentation, now improving.  3. Bicuspid aortic valve with moderate aortic stenosis. Also has associated ascending aortic aneurysm, 4.4 cm.   Recommendations  He is currently on oral Lanoxin, Cardizem CD 240 mg daily, and Toprol-XL 50 mg daily. He has been given two amiodarone doses of 150 mg IV per primary team without much effect. With his last prolonged episode, he actually spontaneously converted with rate control ultimately and did not require cardioversion. Dr. Aundra Dubin mentioned in his last note the possibility of using flecainide with recurrences. For now would increase Toprol-XL to 50 mg twice daily which was which is what he was taking as an outpatient. Since he is not anticoagulated at present, would not make active efforts to cardiovert him, might happen spontaneously with rate control. Need to consider resuming Eliquis ultimately so that we will have more options for treating his arrhythmia. Actually, use of "pill in the pocket" flecainide might be a good choice.  Satira Sark, M.D., F.A.C.C.

## 2016-10-24 NOTE — Progress Notes (Signed)
Tonight, around 2030, patient's HR was going from 110's to 150's. BP 147/94. On call made aware and patient was given his PRN IV cardizem at 2103. At 2200, patient's HR still going up into the 150's, BP 132/70. On call made aware and new order given for IV lopressor 5mg . This helped for a small amount of time, but then HR started started going back up into the 140's-150's constantly. BP taken around 2345. It was 134/90. On call made aware and new order was given for another dose of IV lopressor 5mg  once. RN paged on call again at 0137, because patient's HR. was still uncontrolled going up into the 150's. BP 129/81. RN gave PRN dose of IV cardizem. Patient continues to be asymptomatic. Will continue to monitor.

## 2016-10-24 NOTE — Progress Notes (Signed)
Patient still in a fib, HR going from 110's-150's. BP 130/85. Asymptomatic. On call notified and new orders given for one time dose of IV cardizem 5mg .

## 2016-10-25 DIAGNOSIS — J13 Pneumonia due to Streptococcus pneumoniae: Secondary | ICD-10-CM

## 2016-10-25 DIAGNOSIS — Q231 Congenital insufficiency of aortic valve: Secondary | ICD-10-CM

## 2016-10-25 DIAGNOSIS — I480 Paroxysmal atrial fibrillation: Secondary | ICD-10-CM

## 2016-10-25 DIAGNOSIS — J90 Pleural effusion, not elsewhere classified: Secondary | ICD-10-CM

## 2016-10-25 LAB — COMPREHENSIVE METABOLIC PANEL
ALT: 70 U/L — ABNORMAL HIGH (ref 17–63)
AST: 46 U/L — ABNORMAL HIGH (ref 15–41)
Albumin: 2.4 g/dL — ABNORMAL LOW (ref 3.5–5.0)
Alkaline Phosphatase: 55 U/L (ref 38–126)
Anion gap: 7 (ref 5–15)
BUN: 11 mg/dL (ref 6–20)
CO2: 27 mmol/L (ref 22–32)
Calcium: 8 mg/dL — ABNORMAL LOW (ref 8.9–10.3)
Chloride: 105 mmol/L (ref 101–111)
Creatinine, Ser: 0.8 mg/dL (ref 0.61–1.24)
GFR calc Af Amer: >60 mL/min (ref >60)
GFR calc non Af Amer: >60 mL/min (ref >60)
Glucose, Bld: 117 mg/dL — ABNORMAL HIGH (ref 65–99)
Potassium: 3.4 mmol/L — ABNORMAL LOW (ref 3.5–5.1)
Sodium: 139 mmol/L (ref 135–145)
Total Bilirubin: 0.9 mg/dL (ref 0.3–1.2)
Total Protein: 6.5 g/dL (ref 6.5–8.1)

## 2016-10-25 LAB — BRAIN NATRIURETIC PEPTIDE: B Natriuretic Peptide: 351.8 pg/mL — ABNORMAL HIGH (ref 0.0–100.0)

## 2016-10-25 LAB — MAGNESIUM: Magnesium: 1.4 mg/dL — ABNORMAL LOW (ref 1.7–2.4)

## 2016-10-25 MED ORDER — METOPROLOL TARTRATE 25 MG PO TABS
25.0000 mg | ORAL_TABLET | Freq: Once | ORAL | Status: AC
  Administered 2016-10-25: 25 mg via ORAL
  Filled 2016-10-25: qty 1

## 2016-10-25 MED ORDER — DILTIAZEM HCL 100 MG IV SOLR
5.0000 mg/h | INTRAVENOUS | Status: DC
  Administered 2016-10-25: 17.5 mg/h via INTRAVENOUS
  Filled 2016-10-25: qty 100

## 2016-10-25 MED ORDER — SODIUM CHLORIDE 0.9 % IV BOLUS (SEPSIS): 250.0000 mL | Freq: Once | INTRAVENOUS | Status: DC

## 2016-10-25 MED ORDER — METOPROLOL SUCCINATE ER 50 MG PO TB24
75.0000 mg | ORAL_TABLET | Freq: Two times a day (BID) | ORAL | Status: DC
  Administered 2016-10-25 – 2016-10-28 (×7): 75 mg via ORAL
  Filled 2016-10-25 (×7): qty 1

## 2016-10-25 MED ORDER — MAGNESIUM SULFATE 2 GM/50ML IV SOLN
2.0000 g | Freq: Once | INTRAVENOUS | Status: AC
  Administered 2016-10-25: 2 g via INTRAVENOUS

## 2016-10-25 MED ORDER — POTASSIUM CHLORIDE CRYS ER 20 MEQ PO TBCR
40.0000 meq | EXTENDED_RELEASE_TABLET | ORAL | Status: AC
  Administered 2016-10-25 (×2): 40 meq via ORAL
  Filled 2016-10-25 (×2): qty 2

## 2016-10-25 MED ORDER — BISACODYL 10 MG RE SUPP
10.0000 mg | Freq: Every day | RECTAL | Status: DC | PRN
  Filled 2016-10-25 (×2): qty 1

## 2016-10-25 NOTE — Progress Notes (Signed)
K+ is 3.4, Mag is 1.4, temp this am is 99.1, BP 128/72, HR flux from 98 to 140 but mostly in the low 100's now.  Soyla Murphy notified and requests that Cardizem continue 17.5 and replete electrolytes.

## 2016-10-25 NOTE — Progress Notes (Signed)
Rate continues to sustain in 120's at rest.  BP 130/80's.  Increasing cardizem to 15 as per order.  Have told CTM, and educated pt.  Will continue to monitor closely.

## 2016-10-25 NOTE — Progress Notes (Signed)
PT Cancellation Note  Patient Details Name: [name] [name] MRN: [id number] DOB: 07-22-1941   Cancelled Treatment:    Reason Eval/Treat Not Completed: Medical issues which prohibited therapy (rizem drip for HR)  Tresa Endo PT [id number]  Claretha Cooper 10/25/2016, 7:19 AM

## 2016-10-25 NOTE — Progress Notes (Signed)
Name: [name] [name] MRN: [id number] DOB: 05-20-1941    ADMISSION DATE:  10/18/2016 CONSULTATION DATE:  2/8  REFERRING MD : [facility]  CHIEF COMPLAINT:  Cough   HISTORY OF PRESENT ILLNESS:   76 yo retired [employer] from [place] , never smoker, with PAF and on Eliquis,who presented 10/18/16 with 1 week of cough, green yellow sputum, fever 103, 10 lb weight loss coupled with poor appetite and general malaise. Admitted and treated for CAP with abx and on ct scan noted to have left > right effusions and PCCM called to consult.   SUBJECTIVE:  C/o cough Frustrated, feels like he keeps getting medications but not getting better VITAL SIGNS: Temp:  [97.8 F (36.6 C)-99.1 F (37.3 C)] 99.1 F (37.3 C) (02/12 0500) Pulse Rate:  [73-142] 108 (02/12 1017) Resp:  [18-20] 20 (02/12 0500) BP: (127-148)/(72-124) 127/78 (02/12 1017) SpO2:  [92 %-94 %] 92 % (02/12 0500)  Room air   Intake/Output Summary (Last 24 hours) at 10/25/16 1136 Last data filed at 10/25/16 0900  Gross per 24 hour  Intake          2975.08 ml  Output             2075 ml  Net           900.08 ml    PHYSICAL EXAMINATION: General appearance:  76 Year old  Male well nourished  NAD,conversant  Eyes: anicteric sclerae, moist conjunctivae; PERRL, EOMI bilaterally.  Nose: dried scabbed areas on both nares Mouth:  membranes and no mucosal ulcerations; normal hard and soft palate Neck: Trachea midline; neck supple, no JVD Lungs/chest: Crackles L>R, with normal respiratory effort and no intercostal retractions CV: irreg irreg, + murmur c/w AS  Abdomen: Soft, non-tender; no masses or HSM Extremities: No peripheral edema or extremity lymphadenopathy Skin: Normal temperature, turgor and texture; no rash, ulcers or subcutaneous nodules Psych: Appropriate affect, alert and oriented to person, place and time   Recent Labs Lab 10/23/16 0506 10/24/16 0433 10/25/16 0329  NA 138 140 139  K 3.5 3.4* 3.4*  CL 106 107 105    CO2 25 28 27   BUN 15 14 11   CREATININE 0.77 0.68 0.80  GLUCOSE 115* 110* 117*    Recent Labs Lab 10/19/16 0639 10/22/16 0525 10/24/16 0433  HGB 10.1* 10.9* 11.7*  HCT 29.9* 32.4* 34.6*  WBC 8.8 6.6 5.8  PLT 131* 279 387   Dg Chest Port 1 View  Result Date: 10/24/2016 CLINICAL DATA:  Pneumonia, cough, shortness of breath EXAM: PORTABLE CHEST 1 VIEW COMPARISON:  CT chest dated 10/20/2016 FINDINGS: Left upper lobe opacity/pneumonia. Mild patchy left lower lobe opacity, atelectasis versus pneumonia. Small bilateral pleural effusions. No pneumothorax. Cardiomegaly. IMPRESSION: Left upper lobe pneumonia. Left lower lobe opacity, atelectasis versus pneumonia. Small bilateral pleural effusions. Electronically Signed   By: Julian Hy M.D.   On: 10/24/2016 10:40  personally reviewed: Persistent left UL airspace disease, bilateral edema (aeration a little worse)  ASSESSMENT:  CAP w/ what appears to be left parapneumonic effusion  PAF w/ probable element of pulmonary edema H/o aortic stenosis   Discussion  He looks a little better but still very slow to progress.   Plan Cont abx (would complete 14d total rx) KVO IVFs Rate control per cards Repeat CXR am Will also look w/ [place] to ensure effusion has not re-occurred.   Erick Colace ACNP-BC [place] Pager # [phone] OR # [phone] if no answer  10/25/2016, 11:30 AM

## 2016-10-25 NOTE — Progress Notes (Signed)
Is sustaining over 120 and bp is 137/83.   Increased Cardizem to 12.5 as per order.  CTM informed, pt educated and will continue to monitor closely.

## 2016-10-25 NOTE — Progress Notes (Addendum)
Hr is sustaining in 120's to low 130's.  Pt remains asymptomatic and BP's 130/80's.  Contacted Cardiology on call, Soyla Murphy, and discussed.  And discussed with Charge Nurse, Carolyn Stare RN, who states with new parameters and cardiac drip class, it is okay to keep this pt on the floor if Cardiology requests.  We are drawing pt AM labs now, I have started a 250 bolus, increased his Cardizem drip to 17.5 and am giving him an extra 25 mg of Lopressor.  Cardiology ordered "give this a couple of hours to work and if pt remains in mid 120's or higher call back".  Have educated who states he has no further questions at this time and remains asymptomatic.  Continues with dry, hacky cough which he says has not changed since admission.  Will continue to monitor.

## 2016-10-25 NOTE — Progress Notes (Signed)
The patient has been seen in conjunction with Rosaria Ferries, PA-C. All aspects of care have been considered and discussed. The patient has been personally interviewed, examined, and all clinical data has been reviewed.   Overall cough has improved. He is on appropriate therapy for pneumonia.  We are attempting rate control of atrial fib/flutter using diltiazem and beta blocker therapy.  Eliquis has been resumed.  Overall stable. Since the has been off anticoagulation prior to resuming oral therapy during this hospital stay, will not use amiodarone to attempt cardioversion. Hopefully he will have spontaneous conversion.     Progress Note  Patient Name: [name] [name] Date of Encounter: 10/25/2016  Primary Cardiologist: Dr Aundra Dubin 08/27/2016  Subjective   Feels out of touch with his body. No chest pain or palpitations, multiple other somatic complaints.  Inpatient Medications    Scheduled Meds: . apixaban  5 mg Oral BID  . benzonatate  200 mg Oral TID  . cefTRIAXone (ROCEPHIN)  IV  1 g Intravenous Q24H  . chlorpheniramine-HYDROcodone  5 mL Oral Q8H  . cholecalciferol  1,000 Units Oral Daily  . dextromethorphan  60 mg Oral BID  . diclofenac sodium  2 g Topical QID  . digoxin  0.125 mg Oral Daily  . diltiazem  240 mg Oral Daily  . fluticasone  2 spray Each Nare Daily  . mouth rinse  15 mL Mouth Rinse BID  . metoprolol succinate  75 mg Oral BID  . oxymetazoline  1 spray Each Nare BID  . potassium chloride  40 mEq Oral Q4H  . sodium chloride  250 mL Intravenous Once  . tamsulosin  0.4 mg Oral Daily   Continuous Infusions: . sodium chloride 50 mL/hr (10/25/16 0407)  . diltiazem (CARDIZEM) infusion 17.5 mg/hr (10/25/16 0300)   PRN Meds: acetaminophen **OR** acetaminophen, albuterol, diltiazem, HYDROmorphone (DILAUDID) injection, menthol-cetylpyridinium, polyethylene glycol, senna-docusate, URELLE   Vital Signs    Vitals:   10/25/16 0511 10/25/16 0551 10/25/16 0632  10/25/16 0700  BP:  133/74 130/76   Pulse: 98 (!) 106 73 96  Resp:      Temp:      TempSrc:      SpO2:      Weight:      Height:        Intake/Output Summary (Last 24 hours) at 10/25/16 0904 Last data filed at 10/25/16 0550  Gross per 24 hour  Intake          2975.08 ml  Output             2000 ml  Net           975.08 ml   Filed Weights   10/18/16 0859  Weight: 190 lb (86.2 kg)    Telemetry    Atrial flutter, controlled at times, also ?atrial tachycardia w/ HR 130s-140s at times - Personally Reviewed  ECG    n/a - Personally Reviewed  Physical Exam   General: Well developed, well nourished, male appearing in no acute distress. Head: Normocephalic, atraumatic. Blood around both nares Neck: Supple without bruits, JVD 8-9 cm. Lungs:  Resp regular and unlabored, decreased BS bases, few rales, +upper airway wheeze. Heart: Irreg R&R, S1, S2, no S3, S4, 2/6 murmur; no rub. Abdomen: Soft, non-tender, non-distended with normoactive bowel sounds. No hepatomegaly. No rebound/guarding. No obvious abdominal masses. Extremities: No clubbing, cyanosis, no edema. Distal pedal pulses are 2+ bilaterally. Neuro: Alert and oriented X 3. Moves all extremities spontaneously. Psych: Normal affect.  Labs    Chemistry Recent Labs Lab 10/18/16 1000  10/22/16 1117 10/23/16 0506 10/24/16 0433 10/25/16 0329  NA 133*  < >  --  138 140 139  K 3.5  < >  --  3.5 3.4* 3.4*  CL 97*  < >  --  106 107 105  CO2 24  < >  --  25 28 27   GLUCOSE 131*  < >  --  115* 110* 117*  BUN 55*  < >  --  15 14 11   CREATININE 2.84*  < >  --  0.77 0.68 0.80  CALCIUM 8.6*  < >  --  8.2* 7.8* 8.0*  PROT 8.2*  --  6.7  --   --  6.5  ALBUMIN 3.5  --   --   --   --  2.4*  AST 46*  --   --   --   --  46*  ALT 28  --   --   --   --  70*  ALKPHOS 52  --   --   --   --  55  BILITOT 1.4*  --   --   --   --  0.9  GFRNONAA 20*  < >  --  >60 >60 >60  GFRAA 23*  < >  --  >60 >60 >60  ANIONGAP 12  < >  --  7 5 7   <  > = values in this interval not displayed.  Magnesium  Date Value Ref Range Status  10/25/2016 1.4 (L) 1.7 - 2.4 mg/dL Final   Hematology Recent Labs Lab 10/19/16 0639 10/22/16 0525 10/24/16 0433  WBC 8.8 6.6 5.8  RBC 3.29* 3.48* 3.75*  HGB 10.1* 10.9* 11.7*  HCT 29.9* 32.4* 34.6*  MCV 90.9 93.1 92.3  MCH 30.7 31.3 31.2  MCHC 33.8 33.6 33.8  RDW 13.5 13.7 13.3  PLT 131* 279 387    Cardiac Enzymes Recent Labs Lab 10/18/16 1000 10/18/16 1830 10/18/16 2320 10/19/16 0639  TROPONINI 0.11* 0.07* 0.07* 0.06*     Radiology    Dg Chest Port 1 View  Result Date: 10/24/2016 CLINICAL DATA:  Pneumonia, cough, shortness of breath EXAM: PORTABLE CHEST 1 VIEW COMPARISON:  CT chest dated 10/20/2016 FINDINGS: Left upper lobe opacity/pneumonia. Mild patchy left lower lobe opacity, atelectasis versus pneumonia. Small bilateral pleural effusions. No pneumothorax. Cardiomegaly. IMPRESSION: Left upper lobe pneumonia. Left lower lobe opacity, atelectasis versus pneumonia. Small bilateral pleural effusions. Electronically Signed   By: Julian Hy M.D.   On: 10/24/2016 10:40    Cardiac Studies   ECHO: 10/21/2016 - Left ventricle: The cavity size was normal. There was mild   concentric hypertrophy. Systolic function was normal. The   estimated ejection fraction was in the range of 55% to 60%. Wall   motion was normal; there were no regional wall motion   abnormalities. - Aortic valve: Functionally bicuspid (fused right and left cusps);   moderately thickened, mildly calcified leaflets. There was   moderate stenosis by 2D imaging. The recorded Doppler velocities   are probably an underestimation of true transvalvular gradients.   There was trivial regurgitation. Valve area (VTI): 2.95 cm^2.   Valve area (Vmax): 2.51 cm^2. Valve area (Vmean): 2.39 cm^2. - Mitral valve: There was mild regurgitation directed centrally. - Left atrium: The atrium was moderately dilated. - Atrial septum:  No defect or patent foramen ovale was identified. Impressions: - 2D imaging suggests moderate aortic stenosis. Transaortic   gradients  were much higher on the study from April 2017. Suspect   aortic valve gradients are underestimated on the current study   due to poor beam alignment. Recommendations:  Consider TEE if necessary to better evaluate the aortic valve.   Patient Profile     76 y.o. male with a history of bicuspid AoV, mod AS, PAF on Eliquis, HTN, HLD, 4.4 cm AAA was admitted 02/05 with fever, hyotension, ?PNA. Pt went into rapid afib 02/10>>cards consult  Assessment & Plan    1.  PAF (paroxysmal atrial fibrillation) (HCC) - IV & PO Cardizem, amiodarone, metoprolol, lanoxin used - Dr Aundra Dubin was considering "pill in the pocket" flecainide  - Pt in atrial flutter w/ HR responding to rx, but also has episodes of tachycardia (?atrial tach) that start and stop suddenly and are faster, 130s-140s - continue current rx - will have to watch BP as pt on 17.5 mg IV Cardizem w/ Cardizem 240 mg given this am. Will decrease the IV rx to 10 mg/hr (max dose is 480 mg/day). Not on Cardizem PTA - dig loaded and started this admit - Home Toprol XL is 50 mg bid, now on 75 mg bid, continue  2. Aortic stenosis, bicuspid valve - S2 heard clearly - no sx clearly attributed to this - follow  3.  Hypokalemia - supp given  4.  Hypomagnesemia - IV Mag ordered  Principal Problem:   CAP (community acquired pneumonia) - s/p thoracentesis w/ 1 L parapneumonic fluid removed - per IM/CCM  Otherwise, per IM Active Problems:   Essential hypertension   Aortic valve disorder   Occlusion and stenosis of carotid artery   Chest pain   Dyspnea   Ascending aortic aneurysm (HCC)   Bicuspid aortic valve   Sepsis ([place])   AKI (acute kidney injury) ([place])   Parapneumonic effusion   Hemoptysis    Signed, Rosaria Ferries , PA-C 9:04 AM 10/25/2016 Pager: [phone]

## 2016-10-25 NOTE — Progress Notes (Signed)
PROGRESS NOTE    [name] [name]   [id number]  DOB: 06-13-1941  DOA: 10/18/2016 PCP: [name] Kehr, MD   Brief Narrative:  [name] [name] a 76 y.o.malewith medical history of hypertension, bicuspid aortic valve,dyslipidemia, proximal A. Fib. Presented with cough of about 1 week duration productive of yellowish-green sputum, Fever starting on Saturday of 100-103 degree Fahrenheit, with malaise &poor appetite for the past week. Chest x-ray will possible pneumonia or mass or aneurysm. Patient was started on ceftriaxone and azithromycin and given 3 L bolus of IV fluids.  Subjective: Dry cough persists-  burning micturition continues to improve  Assessment & Plan:   Principal Problem:   CAP (community acquired pneumonia)- Sepsis- left parapneumonic pleural effusion - fever and hemoptysis-  CT chest to further evaluate mass like opacity reveals extensive pneumonia and pleural effusion-   called PCCM- - 1 L of blood fluid drained form left lung and found to be parapneumonic - oxygen has been weaned off - strep Pneumo + >> cont Rocephin - d/c'd Zithromax - Tussionex, Tessalon, Mucinex, Delsym for severe cough and congestion- holding NEBS as HR is uncontrolled - needs 14 days of Abx- will switch to Levaquin on d/c - CXR done yesterday- no recurrence of effusion  Active Problems: Paroxysmal A-fib with RVR - not improved with Cardizem infusion, Metoprolol, Oral cardizem and Dig - not improved with 1 L NS bolus and continuous fluids - not improved with Amio infusion 150 mg  - increased Toprol to 75 mg BID from 50-  - cont Cardizem infusion- HR still > 120 - cardiology assisting with management - have been holding Eliquis due to hemoptysis - resumed on 2/11- no recurrence of hemoptysis - patient constantly stating it is "poison"- per cardiology notes, he has refused anticoagulation in the past-    Hypokalemia, hypomagnesemia  - replacing   B/l hearing loss - improving with Flonase  and Afrin  Urinary frequency with Burning - UA negative for infection, Urelle not helping - added Flomax to help with frequency - given IVF for concentrated urine and poor oral intake- burning has improved    AKI (acute kidney injury) (HCC) - prerenal- holding Ramipril and HCTZ - Cr 2.84 >>> 1.32 >>> 0.95   Mildly elevated Troponin/ Ao stenosis with bicuspid Ao Valve - likely due to resp distress - checked ECHO which reveals mod Ao stenosis- not significantly changed from prior    Essential hypertension - holding Ramipril, HCTZ- cont Metoprolol    Ascending aortic aneurysm/   Bicuspid aortic valve - follow   DVT prophylaxis: Eliquis Code Status: Full code Family Communication: wife Disposition Plan: SNF?  Consultants:   Pulmonary Procedures:  2 d ECHO 2D imaging suggests moderate aortic stenosis. Transaortic   gradients were much higher on the study from April 2017. Suspect   aortic valve gradients are underestimated on the current study   due to poor beam alignment.  Thoracentesis  2/9  Antimicrobials:  Anti-infectives    Start     Dose/Rate Route Frequency Ordered Stop   10/20/16 1000  azithromycin (ZITHROMAX) tablet 500 mg  Status:  Discontinued     500 mg Oral Daily 10/20/16 0813 10/22/16 1111   10/19/16 1200  azithromycin (ZITHROMAX) 500 mg in dextrose 5 % 250 mL IVPB  Status:  Discontinued     500 mg 250 mL/hr over 60 Minutes Intravenous Every 24 hours 10/18/16 1741 10/20/16 0813   10/19/16 1000  cefTRIAXone (ROCEPHIN) 1 g in dextrose 5 % 50 mL IVPB  1 g 100 mL/hr over 30 Minutes Intravenous Every 24 hours 10/18/16 1741     10/18/16 1345  oseltamivir (TAMIFLU) capsule 75 mg  Status:  Discontinued     75 mg Oral  Once 10/18/16 1340 10/18/16 1342   10/18/16 1345  oseltamivir (TAMIFLU) capsule 30 mg  Status:  Discontinued     30 mg Oral  Once 10/18/16 1342 10/18/16 1646   10/18/16 1315  oseltamivir (TAMIFLU) capsule 30 mg  Status:  Discontinued     30 mg  Oral Once 10/18/16 1300 10/18/16 1340   10/18/16 1142  azithromycin (ZITHROMAX) 500 MG injection    Comments:  Peel, Adrienne   : cabinet override      10/18/16 1142 10/18/16 2344   10/18/16 1133  azithromycin (ZITHROMAX) 500 MG injection  Status:  Discontinued    Comments:  Peel, Adrienne   : cabinet override      10/18/16 1133 10/18/16 1144   10/18/16 1130  cefTRIAXone (ROCEPHIN) 1 g in dextrose 5 % 50 mL IVPB     1 g 100 mL/hr over 30 Minutes Intravenous  Once 10/18/16 1127 10/18/16 1227   10/18/16 1130  azithromycin (ZITHROMAX) 500 mg in dextrose 5 % 250 mL IVPB     500 mg 250 mL/hr over 60 Minutes Intravenous  Once 10/18/16 1127 10/18/16 1337       Objective: Vitals:   10/25/16 0551 10/25/16 0632 10/25/16 0700 10/25/16 1017  BP: 133/74 130/76  127/78  Pulse: (!) 106 73 96 (!) 108  Resp:      Temp:      TempSrc:      SpO2:      Weight:      Height:        Intake/Output Summary (Last 24 hours) at 10/25/16 1304 Last data filed at 10/25/16 0900  Gross per 24 hour  Intake          2975.08 ml  Output             2075 ml  Net           900.08 ml   Filed Weights   10/18/16 0859  Weight: 86.2 kg (190 lb)    Examination: General exam: Appears comfortable  HEENT: PERRLA, oral mucosa moist, no sclera icterus or thrush Respiratory system: CTA b/l + severe cough, Respiratory effort normal.  Cardiovascular system: S1 & S2 heard, IIRR.  No murmurs  Gastrointestinal system: Abdomen soft, non-tender, nondistended. Normal bowel sound. No organomegaly Central nervous system: Alert and oriented. No focal neurological deficits. Extremities: No cyanosis, clubbing or edema Skin: small abrasions on back   Psychiatry:  Mood & affect appropriate.     Data Reviewed: I have personally reviewed following labs and imaging studies  CBC:  Recent Labs Lab 10/19/16 0639 10/22/16 0525 10/24/16 0433  WBC 8.8 6.6 5.8  HGB 10.1* 10.9* 11.7*  HCT 29.9* 32.4* 34.6*  MCV 90.9 93.1 92.3    PLT 131* 279 [id number]   Basic Metabolic Panel:  Recent Labs Lab 10/21/16 0530 10/22/16 0525 10/23/16 0506 10/24/16 0433 10/25/16 0329  NA 141 138 138 140 139  K 3.6 3.1* 3.5 3.4* 3.4*  CL 109 105 106 107 105  CO2 25 25 25 28 27   GLUCOSE 107* 100* 115* 110* 117*  BUN 25* 19 15 14 11   CREATININE 0.95 0.85 0.77 0.68 0.80  CALCIUM 8.2* 7.9* 8.2* 7.8* 8.0*  MG  --   --   --   --  1.4*   GFR: Estimated Creatinine Clearance: 83.7 mL/min (by C-G formula based on SCr of 0.8 mg/dL). Liver Function Tests:  Recent Labs Lab 10/22/16 1117 10/25/16 0329  AST  --  46*  ALT  --  70*  ALKPHOS  --  55  BILITOT  --  0.9  PROT 6.7 6.5  ALBUMIN  --  2.4*   No results for input(s): LIPASE, AMYLASE in the last 168 hours. No results for input(s): AMMONIA in the last 168 hours. Coagulation Profile: No results for input(s): INR, PROTIME in the last 168 hours. Cardiac Enzymes:  Recent Labs Lab 10/18/16 1830 10/18/16 2320 10/19/16 0639  TROPONINI 0.07* 0.07* 0.06*   BNP (last 3 results) No results for input(s): PROBNP in the last 8760 hours. HbA1C: No results for input(s): HGBA1C in the last 72 hours. CBG: No results for input(s): GLUCAP in the last 168 hours. Lipid Profile: No results for input(s): CHOL, HDL, LDLCALC, TRIG, CHOLHDL, LDLDIRECT in the last 72 hours. Thyroid Function Tests: No results for input(s): TSH, T4TOTAL, FREET4, T3FREE, THYROIDAB in the last 72 hours. Anemia Panel: No results for input(s): VITAMINB12, FOLATE, FERRITIN, TIBC, IRON, RETICCTPCT in the last 72 hours. Urine analysis:    Component Value Date/Time   COLORURINE YELLOW 10/21/2016 2355   APPEARANCEUR HAZY (A) 10/21/2016 2355   LABSPEC 1.017 10/21/2016 2355   PHURINE 5.0 10/21/2016 2355   GLUCOSEU NEGATIVE 10/21/2016 2355   GLUCOSEU NEGATIVE 11/19/2015 1352   HGBUR SMALL (A) 10/21/2016 2355   BILIRUBINUR NEGATIVE 10/21/2016 2355   KETONESUR 5 (A) 10/21/2016 2355   PROTEINUR NEGATIVE 10/21/2016  2355   UROBILINOGEN 0.2 11/19/2015 1352   NITRITE NEGATIVE 10/21/2016 2355   LEUKOCYTESUR NEGATIVE 10/21/2016 2355   Sepsis Labs: @LABRCNTIP (procalcitonin:4,lacticidven:4) ) Recent Results (from the past 240 hour(s))  MRSA PCR Screening     Status: None   Collection Time: 10/18/16  3:38 PM  Result Value Ref Range Status   MRSA by PCR NEGATIVE NEGATIVE Final    Comment:        The GeneXpert MRSA Assay (FDA approved for NASAL specimens only), is one component of a comprehensive MRSA colonization surveillance program. It is not intended to diagnose MRSA infection nor to guide or monitor treatment for MRSA infections.   Culture, blood (Routine X 2) w Reflex to ID Panel     Status: None   Collection Time: 10/18/16  6:29 PM  Result Value Ref Range Status   Specimen Description BLOOD BLOOD RIGHT FOREARM  Final   Special Requests IN PEDIATRIC BOTTLE 4 CC  Final   Culture   Final    NO GROWTH 5 DAYS Performed at [facility], [place] [address] [place], [place] [address]    Report Status 10/23/2016 FINAL  Final  Culture, blood (Routine X 2) w Reflex to ID Panel     Status: None   Collection Time: 10/18/16  6:29 PM  Result Value Ref Range Status   Specimen Description BLOOD LEFT ANTECUBITAL  Final   Special Requests BOTTLES DRAWN AEROBIC ONLY 5 CC  Final   Culture   Final    NO GROWTH 5 DAYS Performed at [facility], [place] [address] [place], [place] [address]    Report Status 10/23/2016 FINAL  Final  Body fluid culture     Status: None (Preliminary result)   Collection Time: 10/22/16 11:25 AM  Result Value Ref Range Status   Specimen Description PLEURAL LEFT  Final   Special Requests  NONE  Final   Gram Stain   Final    RARE WBC PRESENT, PREDOMINANTLY MONONUCLEAR NO ORGANISMS SEEN    Culture   Final    NO GROWTH 3 DAYS Performed at [place] [address], [place], [place] [address]    Report Status PENDING  Incomplete         Radiology  Studies: Dg Chest Port 1 View  Result Date: 10/24/2016 CLINICAL DATA:  Pneumonia, cough, shortness of breath EXAM: PORTABLE CHEST 1 VIEW COMPARISON:  CT chest dated 10/20/2016 FINDINGS: Left upper lobe opacity/pneumonia. Mild patchy left lower lobe opacity, atelectasis versus pneumonia. Small bilateral pleural effusions. No pneumothorax. Cardiomegaly. IMPRESSION: Left upper lobe pneumonia. Left lower lobe opacity, atelectasis versus pneumonia. Small bilateral pleural effusions. Electronically Signed   By: Julian Hy M.D.   On: 10/24/2016 10:40      Scheduled Meds: . apixaban  5 mg Oral BID  . benzonatate  200 mg Oral TID  . cefTRIAXone (ROCEPHIN)  IV  1 g Intravenous Q24H  . chlorpheniramine-HYDROcodone  5 mL Oral Q8H  . cholecalciferol  1,000 Units Oral Daily  . dextromethorphan  60 mg Oral BID  . diclofenac sodium  2 g Topical QID  . digoxin  0.125 mg Oral Daily  . diltiazem  240 mg Oral Daily  . fluticasone  2 spray Each Nare Daily  . mouth rinse  15 mL Mouth Rinse BID  . metoprolol succinate  75 mg Oral BID  . oxymetazoline  1 spray Each Nare BID  . sodium chloride  250 mL Intravenous Once  . tamsulosin  0.4 mg Oral Daily   Continuous Infusions: . sodium chloride 10 mL/hr at 10/25/16 1201  . diltiazem (CARDIZEM) infusion 10 mg/hr (10/25/16 1052)     LOS: 7 days    Time spent in minutes: 11    [place], MD [facility] Pager: [email or web address] Password [facility] 10/25/2016, 1:04 PM

## 2016-10-25 NOTE — Progress Notes (Signed)
[facility] Attending Note  Presenting HPI:  76 y.o. male with known history of paroxysmal atrial fibrillation. Patient presenting with cough, fever, and weight loss with generalized malaise. Found to have community acquired pneumonia from Streptococcus pneumoniae. Pulmonary consulted to address right pleural effusion.  Subjective:  No acute events overnight. Patient endorsing continued cough that he feels is largely nonproductive. Denies any subjective fever, chills, or sweats.  Review of Systems:  Denies any abdominal pain, nausea, or vomiting. No chest pain or pressure. Reports diffuse myalgias but no arthralgias. Muscle pain is primarily in his legs and chest.  Temp:  [97.8 F (36.6 C)-99.1 F (37.3 C)] 98.7 F (37.1 C) (02/12 1424) Pulse Rate:  [70-142] 70 (02/12 1424) Resp:  [18-20] 20 (02/12 1424) BP: (122-148)/(62-124) 122/62 (02/12 1424) SpO2:  [92 %-94 %] 94 % (02/12 1424)  General:  Awake. No distress. Alert. Eating lunch with wife at bedside. Integument:  Warm & dry. No rash or bruising on exposed skin. HEENT:  No scleral icterus or injection. Pupils symmetric.  Pulmonary:  Mildly coarse breath sounds bilaterally. No accessory muscle use on room air. Good aeration bilaterally.  Cardiovascular:  Regular rate. No JVD apprecaited. No edema. Abdomen:  Soft. Nontender. Normal bowel sounds. Neurological:  Cranial nerves grossly in tact. No meningismus. Moving all 4 extremities equally. Oriented x4.   CBC Latest Ref Rng & Units 10/24/2016 10/22/2016 10/19/2016  WBC 4.0 - 10.5 K/uL 5.8 6.6 8.8  Hemoglobin 13.0 - 17.0 g/dL 11.7(L) 10.9(L) 10.1(L)  Hematocrit 39.0 - 52.0 % 34.6(L) 32.4(L) 29.9(L)  Platelets 150 - 400 K/uL 387 279 131(L)    BMP Latest Ref Rng & Units 10/25/2016 10/24/2016 10/23/2016  Glucose 65 - 99 mg/dL 117(H) 110(H) 115(H)  BUN 6 - 20 mg/dL 11 14 15   Creatinine 0.61 - 1.24 mg/dL 0.80 0.68 0.77  Sodium 135 - 145 mmol/L 139 140 138  Potassium 3.5  - 5.1 mmol/L 3.4(L) 3.4(L) 3.5  Chloride 101 - 111 mmol/L 105 107 106  CO2 22 - 32 mmol/L 27 28 25   Calcium 8.9 - 10.3 mg/dL 8.0(L) 7.8(L) 8.2(L)    IMAGING/STUDIES: CT CHEST W/ CONTRAST 10/20/16:  Personally reviewed by me. Bilateral pleural effusions left greater than right. Patchy groundglass and alveolar opacification left greater than right. No pericardial effusion. No pathologic mediastinal adenopathy.  PORT CXR 10/24/16:  Personally reviewed by me. Continued left upper lobe opacity. Mild blunting of costophrenic angles. Lordotic view.  MICROBIOLOGY: MRSA PCR 2/5:  Negative Blood Cultures x2 2/5:  Negative  Left Pleural Effusion Culture 2/9 >>> Influenza A/B PCR 2/5:  Negative  Urine Streptococcal Antigen 2/8:  Positive  Urine Legionella Antigen 2/8:  Negative   ANTIBIOTICS: Azithromycin 2/6 - 2/9 Rocephin 2/6 >>>  ASSESSMENT/PLAN:  76 y.o. male with multifocal pneumonia due to Streptococcus pneumoniae. Patient with parapneumonic left pleural effusion status post thoracentesis. No obvious sign of great recurrence on x-ray imaging today but poor quality image. Clinically patient is slowly recovering and I did explain to him that this would be a slow process given the severity of his pneumonia.  1. Multifocal Streptococcus pneumoniae pneumonia: Patient currently on day #7 of Rocephin. Recommend a 14 day total course. Awaiting finalization of pleural fluid culture. 2. Left parapneumonic effusion: Plan for repeat bedside ultrasound tomorrow to reassess for possible need for further therapeutic thoracentesis. Awaiting finalization of pleural fluid culture.  Remainder of care as per primary service.  I have spent a total of 36 minutes of  time today caring for the patient, reviewing the patient's electronic medical record, and with more than 50% of that time spent coordinating care with the patient's transfer as well as reviewing the continuing plan of care with the patient & wife at  bedside.  Sonia Baller Ashok Cordia, M.D. [facility]monary [facility] Critical [facility] Pager:  [phone] After 3pm or if no response, call [phone] 3:29 PM 10/25/16

## 2016-10-25 NOTE — Progress Notes (Signed)
10/25/16 2:57 AM  Contacted by RN that patient has had difficulties with afib RVR tonight Cardizem drip now up to 15. HR still in mid 120s to 130s Otherwise asymptomatic and BP is 130/80s  Plan Normal saline 250 cc bolus Checking electrolytes. Goal Mg> 2 and K >4 Increasing Metoprolol Ok to increase Cardizem drip to 17.5 until the interventions above have had a chance to take effect  Soyla Murphy, MD Cardiology

## 2016-10-26 ENCOUNTER — Other Ambulatory Visit: Payer: Self-pay | Admitting: Internal Medicine

## 2016-10-26 ENCOUNTER — Inpatient Hospital Stay (HOSPITAL_COMMUNITY): Payer: Medicare Other

## 2016-10-26 DIAGNOSIS — Z9889 Other specified postprocedural states: Secondary | ICD-10-CM

## 2016-10-26 DIAGNOSIS — I10 Essential (primary) hypertension: Secondary | ICD-10-CM

## 2016-10-26 LAB — BASIC METABOLIC PANEL
Anion gap: 7 (ref 5–15)
BUN: 13 mg/dL (ref 6–20)
CO2: 28 mmol/L (ref 22–32)
Calcium: 8.1 mg/dL — ABNORMAL LOW (ref 8.9–10.3)
Chloride: 105 mmol/L (ref 101–111)
Creatinine, Ser: 0.83 mg/dL (ref 0.61–1.24)
GFR calc Af Amer: >60 mL/min (ref >60)
GFR calc non Af Amer: >60 mL/min (ref >60)
Glucose, Bld: 113 mg/dL — ABNORMAL HIGH (ref 65–99)
Potassium: 3.5 mmol/L (ref 3.5–5.1)
Sodium: 140 mmol/L (ref 135–145)

## 2016-10-26 LAB — MAGNESIUM: Magnesium: 1.7 mg/dL (ref 1.7–2.4)

## 2016-10-26 LAB — BODY FLUID CULTURE: Culture: NO GROWTH

## 2016-10-26 LAB — APTT: aPTT: 32 seconds (ref 24–36)

## 2016-10-26 LAB — HEPARIN LEVEL (UNFRACTIONATED): Heparin Unfractionated: 1.54 IU/mL — ABNORMAL HIGH (ref 0.30–0.70)

## 2016-10-26 MED ORDER — POTASSIUM CHLORIDE CRYS ER 20 MEQ PO TBCR
40.0000 meq | EXTENDED_RELEASE_TABLET | ORAL | Status: AC
Start: 2016-10-26 — End: 2016-10-26
  Administered 2016-10-26: 40 meq via ORAL
  Filled 2016-10-26: qty 2

## 2016-10-26 MED ORDER — BISACODYL 10 MG RE SUPP
10.0000 mg | Freq: Once | RECTAL | Status: AC
  Administered 2016-10-26: 10 mg via RECTAL
  Filled 2016-10-26: qty 1

## 2016-10-26 MED ORDER — DILTIAZEM HCL ER COATED BEADS 120 MG PO CP24
120.0000 mg | ORAL_CAPSULE | Freq: Every day | ORAL | Status: DC
  Administered 2016-10-26 – 2016-10-27 (×2): 120 mg via ORAL
  Filled 2016-10-26 (×2): qty 1

## 2016-10-26 MED ORDER — TAB-A-VITE/IRON PO TABS
1.0000 | ORAL_TABLET | Freq: Every day | ORAL | Status: DC
  Administered 2016-10-26 – 2016-10-28 (×3): 1 via ORAL
  Filled 2016-10-26 (×3): qty 1

## 2016-10-26 MED ORDER — MAGNESIUM CITRATE PO SOLN
1.0000 | Freq: Once | ORAL | Status: AC
  Administered 2016-10-26: 1 via ORAL
  Filled 2016-10-26: qty 296

## 2016-10-26 MED ORDER — FUROSEMIDE 10 MG/ML IJ SOLN
40.0000 mg | Freq: Once | INTRAMUSCULAR | Status: AC
  Administered 2016-10-26: 40 mg via INTRAVENOUS
  Filled 2016-10-26: qty 4

## 2016-10-26 MED ORDER — BOOST / RESOURCE BREEZE PO LIQD
1.0000 | Freq: Three times a day (TID) | ORAL | Status: DC
  Administered 2016-10-26 – 2016-10-28 (×5): 1 via ORAL

## 2016-10-26 MED ORDER — HEPARIN (PORCINE) IN NACL 100-0.45 UNIT/ML-% IJ SOLN
1200.0000 [IU]/h | INTRAMUSCULAR | Status: DC
  Administered 2016-10-26: 1200 [IU]/h via INTRAVENOUS
  Filled 2016-10-26: qty 250

## 2016-10-26 MED ORDER — ENSURE ENLIVE PO LIQD
237.0000 mL | ORAL | Status: DC
  Administered 2016-10-26 – 2016-10-27 (×2): 237 mL via ORAL

## 2016-10-26 NOTE — Care Management Note (Signed)
Case Management Note  Patient Details  Name: Hawke Ueland MRN: [id number] Date of Birth: 10-25-1940  Subjective/Objective:   CAP, Afib with RVR                 Action/Plan:  Plan to discharge home with wife   Expected Discharge Date:  10/29/2016               Expected Discharge Plan:  [place]  In-House Referral:     Discharge planning Services  CM Consult  Post Acute Care Choice:  Home Health Choice offered to:  Patient  DME Arranged:  N/A DME Agency:     HH Arranged:    [place] Agency:     Status of Service:  In process, will continue to follow  If discussed at Long Length of Stay Meetings, dates discussed:    Additional CommentsPurcell Mouton, RN 10/26/2016, 2:43 PM

## 2016-10-26 NOTE — Progress Notes (Addendum)
PROGRESS NOTE    [name] [name]   [id number]  DOB: [birth year]  DOA: 10/18/2016 PCP: [name] Kehr, MD   Brief Narrative:  [name] a 76 y.o.malewith medical history of hypertension, bicuspid aortic valve,dyslipidemia, proximal A. Fib. Presented with cough of about 1 week duration productive of yellowish-green sputum, Fever starting on Saturday of 100-103 degree Fahrenheit, with malaise &poor appetite for the past week. Chest x-ray will possible pneumonia or mass or aneurysm. Patient was started on ceftriaxone and azithromycin and given 3 L bolus of IV fluids. CT scan revealed extensive L sided pneumonia and no mass. Also had mod L effusion.  He has had complaints of cough, initially with hemoptysis, severe burning micturition (negative UA) & pain both thighs whenever he coughs. When he has coughing fits, he feels like he is dyging.  He underwent a L thoracentesis on 2/9 which showed a para pneumonic effusion. His has PAF and his HR has been uncontrolled. He has been in A-fib with RVR for 3 days in a row with rates > 130 treated with Metoprolol, Cardizem (oral and infusion), Dig and Amiodarone- it finally resolved yesterday but still having bursts of SVT- see below. Pulmonary and Cardiology following.   Subjective: Dry cough persists-  burning micturition continues to improve  Assessment & Plan:   Principal Problem:   Left streptococcal pneumoniae CAP- Sepsis- left parapneumonic pleural effusion - fever, cough and hemoptysis on admission -  CT chest to further evaluate mass like opacity seen on CXR reveals extensive pneumonia and pleural effusion-   called PCCM-  -  2/9-1 L of blood fluid drained form left lung and found to be parapneumonic - oxygen then weaned off - strep Pneumo U antigen found to be positive  >> cont Rocephin - d/c'd Zithromax-- needs 14 days of Abx- PCCM initially recommend to switch to Levaquin on d/c  - Tussionex, Tessalon, Mucinex, Delsym for severe cough  and congestion but still having cough - it is now less productive cough and hemoptysis resolved a few days ago - held NEBS as HR was uncontrolled and he is no longer wheezing - CXR done 2/11 and again today states effusions are small but per bedside ultrasound by PCCM, it is moderate on the left- PCCM considering re-tapping and recommend holding Eliquis today     Active Problems: Paroxysmal A-fib with RVR - not improved with Cardizem infusion, Metoprolol, Oral cardizem and Dig - not improved with 1 L NS bolus and continuous fluids - not improved with Amio infusion 150 mg  - increased Toprol to 75 mg BID from 50 BID  - cont Cardizem infusion- HR was still > 120 and then dropped to 60s yesterday afternoon- still having bursts of SVT and still on Cardizem infusion - cardiology assisting with management - have been holding Eliquis due to hemoptysis - resumed on 2/11- no recurrence of hemoptysis- PCCM held it today for possible thoracentesis  - patient constantly stating it is "poison"- per cardiology notes, he has refused anticoagulation in the past-    Hypokalemia, hypomagnesemia  - replaced- will give more Kdur today as he will receive lasix per PCCM   B/l hearing loss - improving with Flonase and Afrin  Urinary frequency with Burning - was constantly complaining that it burns when he urinates - UA negative for infection, Urelle not helping therefore it was stopped - added Flomax to help with frequency - given IVF for concentrated urine and poor oral intake- burning has improved- PCCM would like to diurese  him today for recurrent pleural effusion    AKI (acute kidney injury) ([place]) - prerenal- holding Ramipril and HCTZ - Cr 2.84 >>> 1.32 >>> 0.95   Mildly elevated Troponin/ Ao stenosis with bicuspid Ao Valve - likely due to resp distress - checked ECHO which reveals mod Ao stenosis- not significantly changed from prior    Essential hypertension - holding Ramipril, HCTZ- cont  Metoprolol    Ascending aortic aneurysm/   Bicuspid aortic valve - follow   Constipation - mag citrate today as Miralax and Dulcolax suppository have not helped  DVT prophylaxis: Eliquis Code Status: Full code Family Communication: wife Disposition Plan: SNF?  Consultants:   Pulmonary Procedures:  2 d ECHO 2D imaging suggests moderate aortic stenosis. Transaortic   gradients were much higher on the study from April 2017. Suspect   aortic valve gradients are underestimated on the current study   due to poor beam alignment.  Thoracentesis  2/9  Antimicrobials:  Anti-infectives    Start     Dose/Rate Route Frequency Ordered Stop   10/20/16 1000  azithromycin (ZITHROMAX) tablet 500 mg  Status:  Discontinued     500 mg Oral Daily 10/20/16 0813 10/22/16 1111   10/19/16 1200  azithromycin (ZITHROMAX) 500 mg in dextrose 5 % 250 mL IVPB  Status:  Discontinued     500 mg 250 mL/hr over 60 Minutes Intravenous Every 24 hours 10/18/16 1741 10/20/16 0813   10/19/16 1000  cefTRIAXone (ROCEPHIN) 1 g in dextrose 5 % 50 mL IVPB     1 g 100 mL/hr over 30 Minutes Intravenous Every 24 hours 10/18/16 1741     10/18/16 1345  oseltamivir (TAMIFLU) capsule 75 mg  Status:  Discontinued     75 mg Oral  Once 10/18/16 1340 10/18/16 1342   10/18/16 1345  oseltamivir (TAMIFLU) capsule 30 mg  Status:  Discontinued     30 mg Oral  Once 10/18/16 1342 10/18/16 1646   10/18/16 1315  oseltamivir (TAMIFLU) capsule 30 mg  Status:  Discontinued     30 mg Oral Once 10/18/16 1300 10/18/16 1340   10/18/16 1142  azithromycin (ZITHROMAX) 500 MG injection    Comments:  Peel, Adrienne   : cabinet override      10/18/16 1142 10/18/16 2344   10/18/16 1133  azithromycin (ZITHROMAX) 500 MG injection  Status:  Discontinued    Comments:  Peel, Adrienne   : cabinet override      10/18/16 1133 10/18/16 1144   10/18/16 1130  cefTRIAXone (ROCEPHIN) 1 g in dextrose 5 % 50 mL IVPB     1 g 100 mL/hr over 30 Minutes  Intravenous  Once 10/18/16 1127 10/18/16 1227   10/18/16 1130  azithromycin (ZITHROMAX) 500 mg in dextrose 5 % 250 mL IVPB     500 mg 250 mL/hr over 60 Minutes Intravenous  Once 10/18/16 1127 10/18/16 1337       Objective: Vitals:   10/25/16 1017 10/25/16 1424 10/25/16 2052 10/26/16 0458  BP: 127/78 122/62 (!) 132/50 126/86  Pulse: (!) 108 70 74 75  Resp:  20 20 20   Temp:  98.7 F (37.1 C) 97.6 F (36.4 C) 97.4 F (36.3 C)  TempSrc:  Oral Oral Oral  SpO2:  94% 93% 98%  Weight:      Height:        Intake/Output Summary (Last 24 hours) at 10/26/16 1242 Last data filed at 10/26/16 1100  Gross per 24 hour  Intake  770.96 ml  Output             1125 ml  Net          -354.04 ml   Filed Weights   10/18/16 0859  Weight: 86.2 kg (190 lb)    Examination: General exam: Appears comfortable  HEENT: PERRLA, oral mucosa moist, no sclera icterus or thrush- small scabs on nose and lips Respiratory system: CTA b/l but has severe cough, decreased breath sound in LLL- Respiratory effort normal.  Cardiovascular system: S1 & S2 heard, IIRR.  No murmurs  Gastrointestinal system: Abdomen soft, non-tender, nondistended. Normal bowel sound. No organomegaly Central nervous system: Alert and oriented. No focal neurological deficits. Extremities: No cyanosis, clubbing or edema Skin: small abrasions on back   Psychiatry:  Mood & affect appropriate.     Data Reviewed: I have personally reviewed following labs and imaging studies  CBC:  Recent Labs Lab 10/22/16 0525 10/24/16 0433  WBC 6.6 5.8  HGB 10.9* 11.7*  HCT 32.4* 34.6*  MCV 93.1 92.3  PLT 279 [id number]   Basic Metabolic Panel:  Recent Labs Lab 10/22/16 0525 10/23/16 0506 10/24/16 0433 10/25/16 0329 10/26/16 0505  NA 138 138 140 139 140  K 3.1* 3.5 3.4* 3.4* 3.5  CL 105 106 107 105 105  CO2 25 25 28 27 28   GLUCOSE 100* 115* 110* 117* 113*  BUN 19 15 14 11 13   CREATININE 0.85 0.77 0.68 0.80 0.83  CALCIUM 7.9*  8.2* 7.8* 8.0* 8.1*  MG  --   --   --  1.4* 1.7   GFR: Estimated Creatinine Clearance: 80.6 mL/min (by C-G formula based on SCr of 0.83 mg/dL). Liver Function Tests:  Recent Labs Lab 10/22/16 1117 10/25/16 0329  AST  --  46*  ALT  --  70*  ALKPHOS  --  55  BILITOT  --  0.9  PROT 6.7 6.5  ALBUMIN  --  2.4*   No results for input(s): LIPASE, AMYLASE in the last 168 hours. No results for input(s): AMMONIA in the last 168 hours. Coagulation Profile: No results for input(s): INR, PROTIME in the last 168 hours. Cardiac Enzymes: No results for input(s): CKTOTAL, CKMB, CKMBINDEX, TROPONINI in the last 168 hours. BNP (last 3 results) No results for input(s): PROBNP in the last 8760 hours. HbA1C: No results for input(s): HGBA1C in the last 72 hours. CBG: No results for input(s): GLUCAP in the last 168 hours. Lipid Profile: No results for input(s): CHOL, HDL, LDLCALC, TRIG, CHOLHDL, LDLDIRECT in the last 72 hours. Thyroid Function Tests: No results for input(s): TSH, T4TOTAL, FREET4, T3FREE, THYROIDAB in the last 72 hours. Anemia Panel: No results for input(s): VITAMINB12, FOLATE, FERRITIN, TIBC, IRON, RETICCTPCT in the last 72 hours. Urine analysis:    Component Value Date/Time   COLORURINE YELLOW 10/21/2016 2355   APPEARANCEUR HAZY (A) 10/21/2016 2355   LABSPEC 1.017 10/21/2016 2355   PHURINE 5.0 10/21/2016 2355   GLUCOSEU NEGATIVE 10/21/2016 2355   GLUCOSEU NEGATIVE 11/19/2015 1352   HGBUR SMALL (A) 10/21/2016 2355   BILIRUBINUR NEGATIVE 10/21/2016 2355   KETONESUR 5 (A) 10/21/2016 2355   PROTEINUR NEGATIVE 10/21/2016 2355   UROBILINOGEN 0.2 11/19/2015 1352   NITRITE NEGATIVE 10/21/2016 2355   LEUKOCYTESUR NEGATIVE 10/21/2016 2355   Sepsis Labs: @LABRCNTIP (procalcitonin:4,lacticidven:4) ) Recent Results (from the past 240 hour(s))  MRSA PCR Screening     Status: None   Collection Time: 10/18/16  3:38 PM  Result Value Ref Range Status  MRSA by PCR NEGATIVE NEGATIVE  Final    Comment:        The GeneXpert MRSA Assay (FDA approved for NASAL specimens only), is one component of a comprehensive MRSA colonization surveillance program. It is not intended to diagnose MRSA infection nor to guide or monitor treatment for MRSA infections.   Culture, blood (Routine X 2) w Reflex to ID Panel     Status: None   Collection Time: 10/18/16  6:29 PM  Result Value Ref Range Status   Specimen Description BLOOD BLOOD RIGHT FOREARM  Final   Special Requests IN PEDIATRIC BOTTLE 4 CC  Final   Culture   Final    NO GROWTH 5 DAYS Performed at [facility], [place] [address] [place], [place] [address]    Report Status 10/23/2016 FINAL  Final  Culture, blood (Routine X 2) w Reflex to ID Panel     Status: None   Collection Time: 10/18/16  6:29 PM  Result Value Ref Range Status   Specimen Description BLOOD LEFT ANTECUBITAL  Final   Special Requests BOTTLES DRAWN AEROBIC ONLY 5 CC  Final   Culture   Final    NO GROWTH 5 DAYS Performed at [facility], [place] [address], [place], [place] [address]    Report Status 10/23/2016 FINAL  Final  Body fluid culture     Status: None   Collection Time: 10/22/16 11:25 AM  Result Value Ref Range Status   Specimen Description PLEURAL LEFT  Final   Special Requests NONE  Final   Gram Stain   Final    RARE WBC PRESENT, PREDOMINANTLY MONONUCLEAR NO ORGANISMS SEEN    Culture   Final    NO GROWTH 3 DAYS Performed at [facility], [place] [address], [place], [place] [address]    Report Status 10/26/2016 FINAL  Final         Radiology Studies: Dg Chest Port 1 View  Result Date: 10/26/2016 CLINICAL DATA:  Pneumonia . EXAM: PORTABLE CHEST 1 VIEW COMPARISON:  08/23/2017. FINDINGS: Mediastinum and heart size stable. Persistent left upper lobe infiltrate Persistent left lower lobe atelectasis and infiltrate. Small bilateral pleural effusions. IMPRESSION: 1. Persistent left upper lobe infiltrate. Persistent left lower  lobe atelectasis and infiltrate. 2. Small bilateral pleural effusions. Electronically Signed   By: Marcello Moores  Register   On: 10/26/2016 06:58      Scheduled Meds: . benzonatate  200 mg Oral TID  . cefTRIAXone (ROCEPHIN)  IV  1 g Intravenous Q24H  . chlorpheniramine-HYDROcodone  5 mL Oral Q8H  . cholecalciferol  1,000 Units Oral Daily  . dextromethorphan  60 mg Oral BID  . diclofenac sodium  2 g Topical QID  . digoxin  0.125 mg Oral Daily  . diltiazem  240 mg Oral Daily  . feeding supplement  1 Container Oral TID BM  . feeding supplement (ENSURE ENLIVE)  237 mL Oral Q24H  . fluticasone  2 spray Each Nare Daily  . mouth rinse  15 mL Mouth Rinse BID  . metoprolol succinate  75 mg Oral BID  . multivitamins with iron  1 tablet Oral Daily  . oxymetazoline  1 spray Each Nare BID  . sodium chloride  250 mL Intravenous Once  . tamsulosin  0.4 mg Oral Daily   Continuous Infusions: . diltiazem (CARDIZEM) infusion 7 mg/hr (10/26/16 0900)     LOS: 8 days    Time spent in minutes: 25    [place], MD  [facility] Pager: [email or web address] Password [facility] 10/26/2016, 12:42 PM

## 2016-10-26 NOTE — Care Management Important Message (Signed)
Important Message  Patient Details  Name: [name] [name] MRN: [id number] Date of Birth: [birth year]   Medicare Important Message Given:  Yes    Kerin Salen 10/26/2016, 12:24 [place] Message  Patient Details  Name: [name] [name] MRN: [id number] Date of Birth: 08-24-1941   Medicare Important Message Given:  Yes    Kerin Salen 10/26/2016, 12:24 PM

## 2016-10-26 NOTE — Progress Notes (Addendum)
The patient has been seen in conjunction with Philemon Kingdom, [facility]. All aspects of care have been considered and discussed. The patient has been personally interviewed, examined, and all clinical data has been reviewed.   Patient still has significant upper respiratory infection producing cardiovascular stress. In this situation, if cardioverted the likelihood of recurrent atrial fibrillation would be great.  Will likely need TEE guided cardioversion prior to discharge, but after his pulmonary status is improved.  May also need to institute antiarrhythmic therapy to give protection against recurrence. We should not do that currently given interrupted anticoagulation therapy and potential higher than acceptable risk of embolism.  The current plan is to continue rate control until pulmonary status improves. We will attempt to discontinue IV diltiazem and increase the oral dose.  BNP is less than 500. Doubt there is much volume excess although I don't disagree with attempting diuresis of kidneys and blood pressure tolerated.  Oral anticoagulation has been discontinued. He needs IV heparin that can be discontinued to allow thoracentesis. Resume oral anticoagulation after pulmonary procedure has been completed.    Progress Note  Patient Name: [name] [name] Date of Encounter: 10/26/2016  Primary Cardiologist: Dr Aundra Dubin 08/27/2016  Subjective   Denies any symptoms this morning, no chest pain or dyspnea. HR remains elevated despite medication.   Inpatient Medications    Scheduled Meds: . apixaban  5 mg Oral BID  . benzonatate  200 mg Oral TID  . bisacodyl  10 mg Rectal Once  . cefTRIAXone (ROCEPHIN)  IV  1 g Intravenous Q24H  . chlorpheniramine-HYDROcodone  5 mL Oral Q8H  . cholecalciferol  1,000 Units Oral Daily  . dextromethorphan  60 mg Oral BID  . diclofenac sodium  2 g Topical QID  . digoxin  0.125 mg Oral Daily  . diltiazem  240 mg Oral Daily  . fluticasone  2 spray Each Nare  Daily  . magnesium citrate  1 Bottle Oral Once  . mouth rinse  15 mL Mouth Rinse BID  . metoprolol succinate  75 mg Oral BID  . oxymetazoline  1 spray Each Nare BID  . sodium chloride  250 mL Intravenous Once  . tamsulosin  0.4 mg Oral Daily   Continuous Infusions: . diltiazem (CARDIZEM) infusion 7 mg/hr (10/26/16 0900)   PRN Meds: acetaminophen **OR** acetaminophen, albuterol, bisacodyl, diltiazem, HYDROmorphone (DILAUDID) injection, menthol-cetylpyridinium, polyethylene glycol, senna-docusate   Vital Signs    Vitals:   10/25/16 1017 10/25/16 1424 10/25/16 2052 10/26/16 0458  BP: 127/78 122/62 (!) 132/50 126/86  Pulse: (!) 108 70 74 75  Resp:  20 20 20   Temp:  98.7 F (37.1 C) 97.6 F (36.4 C) 97.4 F (36.3 C)  TempSrc:  Oral Oral Oral  SpO2:  94% 93% 98%  Weight:      Height:        Intake/Output Summary (Last 24 hours) at 10/26/16 0941 Last data filed at 10/26/16 0458  Gross per 24 hour  Intake           873.46 ml  Output              875 ml  Net            -1.54 ml   Filed Weights   10/18/16 0859  Weight: 190 lb (86.2 kg)    Telemetry    Atrial flutter/fib Rates 110s-130s - Personally Reviewed  ECG    n/a - Personally Reviewed  Physical Exam   General: Well developed, well  nourished, male appearing in no acute distress. Head: Normocephalic, atraumatic. Blood around both nares Neck: Supple without bruits, JVD 8-9 cm. Lungs:  Resp regular and unlabored, decreased BS bases, few rales, mild wheezing Heart: Irreg R&R, S1, S2, no S3, S4, 2/6 murmur; no rub. Abdomen: Soft, non-tender, non-distended with normoactive bowel sounds. No hepatomegaly. No rebound/guarding. No obvious abdominal masses. Extremities: No clubbing, cyanosis, 1+ bilateral LE edema. Distal pedal pulses are 2+ bilaterally. Neuro: Alert and oriented X 3. Moves all extremities spontaneously. Psych: Normal affect.  Labs    Chemistry Recent Labs Lab 10/22/16 1117  10/24/16 0433  10/25/16 0329 10/26/16 0505  NA  --   < > 140 139 140  K  --   < > 3.4* 3.4* 3.5  CL  --   < > 107 105 105  CO2  --   < > 28 27 28   GLUCOSE  --   < > 110* 117* 113*  BUN  --   < > 14 11 13   CREATININE  --   < > 0.68 0.80 0.83  CALCIUM  --   < > 7.8* 8.0* 8.1*  PROT 6.7  --   --  6.5  --   ALBUMIN  --   --   --  2.4*  --   AST  --   --   --  46*  --   ALT  --   --   --  70*  --   ALKPHOS  --   --   --  55  --   BILITOT  --   --   --  0.9  --   GFRNONAA  --   < > >60 >60 >60  GFRAA  --   < > >60 >60 >60  ANIONGAP  --   < > 5 7 7   < > = values in this interval not displayed.  Magnesium  Date Value Ref Range Status  10/26/2016 1.7 1.7 - 2.4 mg/dL Final   Hematology  Recent Labs Lab 10/22/16 0525 10/24/16 0433  WBC 6.6 5.8  RBC 3.48* 3.75*  HGB 10.9* 11.7*  HCT 32.4* 34.6*  MCV 93.1 92.3  MCH 31.3 31.2  MCHC 33.6 33.8  RDW 13.7 13.3  PLT 279 387    Cardiac EnzymesNo results for input(s): TROPONINI in the last 168 hours.   Radiology    Dg Chest Port 1 View  Result Date: 10/26/2016 CLINICAL DATA:  Pneumonia . EXAM: PORTABLE CHEST 1 VIEW COMPARISON:  08/23/2017. FINDINGS: Mediastinum and heart size stable. Persistent left upper lobe infiltrate Persistent left lower lobe atelectasis and infiltrate. Small bilateral pleural effusions. IMPRESSION: 1. Persistent left upper lobe infiltrate. Persistent left lower lobe atelectasis and infiltrate. 2. Small bilateral pleural effusions. Electronically Signed   By: Marcello Moores  Register   On: 10/26/2016 06:58   Dg Chest Port 1 View  Result Date: 10/24/2016 CLINICAL DATA:  Pneumonia, cough, shortness of breath EXAM: PORTABLE CHEST 1 VIEW COMPARISON:  CT chest dated 10/20/2016 FINDINGS: Left upper lobe opacity/pneumonia. Mild patchy left lower lobe opacity, atelectasis versus pneumonia. Small bilateral pleural effusions. No pneumothorax. Cardiomegaly. IMPRESSION: Left upper lobe pneumonia. Left lower lobe opacity, atelectasis versus  pneumonia. Small bilateral pleural effusions. Electronically Signed   By: Julian Hy M.D.   On: 10/24/2016 10:40    Cardiac Studies   ECHO: 10/21/2016 - Left ventricle: The cavity size was normal. There was mild   concentric hypertrophy. Systolic function was normal. The   estimated  ejection fraction was in the range of 55% to 60%. Wall   motion was normal; there were no regional wall motion   abnormalities. - Aortic valve: Functionally bicuspid (fused right and left cusps);   moderately thickened, mildly calcified leaflets. There was   moderate stenosis by 2D imaging. The recorded Doppler velocities   are probably an underestimation of true transvalvular gradients.   There was trivial regurgitation. Valve area (VTI): 2.95 cm^2.   Valve area (Vmax): 2.51 cm^2. Valve area (Vmean): 2.39 cm^2. - Mitral valve: There was mild regurgitation directed centrally. - Left atrium: The atrium was moderately dilated. - Atrial septum: No defect or patent foramen ovale was identified. Impressions: - 2D imaging suggests moderate aortic stenosis. Transaortic   gradients were much higher on the study from April 2017. Suspect   aortic valve gradients are underestimated on the current study   due to poor beam alignment. Recommendations:  Consider TEE if necessary to better evaluate the aortic valve.   Patient Profile     76 y.o. male with a history of bicuspid AoV, mod AS, PAF on Eliquis, HTN, HLD, 4.4 cm AAA was admitted 02/05 with fever, hyotension, ?PNA. Pt went into rapid afib 02/10>>cards consult  Assessment & Plan    1.  PAF (paroxysmal atrial fibrillation)  - IV & PO Cardizem, metoprolol, lanoxin used - Dr Aundra Dubin was considering "pill in the pocket" flecainide  - dig loaded and started this admit - on Eliquis for [facility] - Pt in atrial flutter w/ HR has not adequately responded to medical therapy at this time. Consider TEE/DCCV at this time as he has received 5 doses of Eliquis this  admission? Discussed briefly with patient, will have MD follow up with recommendations.  - Home Toprol XL is 50 mg bid, now on 75 mg bid, continue  2. Aortic stenosis, bicuspid valve - S2 heard clearly - no sx clearly attributed to this - follow  3.  Hypokalemia - resolved this am  4.  Hypomagnesemia - improved  5.  CAP  - s/p thoracentesis w/ 1 L parapneumonic fluid removed - per IM/CCM  6. Diastolic HF?: BNP elevated yesterday, and noted LE edema primarily around ankles. Significantly net + on I&Os. CXR with continued small bilateral effusions. -- Will IV lasix 40mg x1 today  Otherwise, per IM Active Problems:   Essential hypertension   Aortic valve disorder   Occlusion and stenosis of carotid artery   Chest pain   Dyspnea   Ascending aortic aneurysm (HCC)   Bicuspid aortic valve   Sepsis ([place])   AKI (acute kidney injury) ([place])   Parapneumonic effusion   Hemoptysis  Signed, Reino Bellis , NP 9:41 AM 10/26/2016

## 2016-10-26 NOTE — Progress Notes (Signed)
ANTICOAGULATION CONSULT NOTE - Initial Consult  Pharmacy Consult for Heparin Indication: atrial fibrillation  Allergies  Allergen Reactions  . Xarelto [Rivaroxaban] Other (See Comments)    INCREASED BP-HYPERTENSIVE EVENTS  . Benazepril Cough    Patient Measurements: Height: 5\' 11"  (180.3 cm) Weight: 190 lb (86.2 kg) IBW/kg (Calculated) : 75.3 Heparin Dosing Weight: actual body weight  Vital Signs: Temp: 97.7 F (36.5 C) (02/13 1500) Temp Source: Oral (02/13 1500) BP: 117/71 (02/13 1500) Pulse Rate: 84 (02/13 1500)  Labs:  Recent Labs  10/24/16 0433 10/25/16 0329 10/26/16 0505 10/26/16 1613  HGB 11.7*  --   --   --   HCT 34.6*  --   --   --   PLT 387  --   --   --   APTT  --   --   --  32  HEPARINUNFRC  --   --   --  1.54*  CREATININE 0.68 0.80 0.83  --     Estimated Creatinine Clearance: 80.6 mL/min (by C-G formula based on SCr of 0.83 mg/dL).   Medical History: Past Medical History:  Diagnosis Date  . Aortic stenosis, mild    Echo 07/11: 55-60%, mild LV hypertrophy, mild aortic stenosis w/mean gradient 45mmHg and peak gradient 36 mmHg.  . Ascending aortic aneurysm ([place])   . Bicuspid aortic valve   . Chest pain    ETT-myoview 12/11 w/exercise, no chest pain, no significant ST changes, EF 69%, no evidence for ischemia or infarction.  [name] HTN (hypertension)   . Hypercholesteremia   . LBP (low back pain)   . Moderate aortic stenosis   . Osteoarthritis   . Paroxysmal atrial fibrillation ([place])    a. new onset Afib in 07/2008. He underwent ibutilide cardioversion successfully. b. Recurrence 01/2013 s/p TEE/DCCV - was on Xarelto but he stopped it as he was convinced it was causing joint pn. c. Recurrence 01/2016 - spont conv to NSR. Pt took Eliquis x1 mo then declined further anticoag. d. Recurrence 07/2016.    Assessment:  76 yr male with PMH significant for HTN, bicuspid aortic valve, AFib (on Eliquis) admitted on 2/5 with sepsis due to CAP  Patient has  continued on home medications of Eliquis 5mg  BID.  Noted today plans for likely need for TEE guided cardioversion.  Eliquis was d/c'ed and pharmacy consulted to dose IV heparin in anticipation of procedure  Last dose of Eliquis taken 10/26/16 @ 09:02 therefore will not begin IV heparin until 12 hrs after last dose  Baseline aPTT normal; heparin level elevated (due to effects of Eliquis, which are known to cause falsely elevated heparin levels)  Goal of Therapy:  Heparin level 0.3-0.7 units/ml aPTT 66-102 seconds Monitor platelets by anticoagulation protocol: Yes   Plan:   At 21:00 begin IV heparin infusion @ 1200 units/hr (no bolus given)  Check aPTT and Heparin level 8 hr after heparin started  Will follow aPTT and Heparin level until heparin level correlates and then will continue following with heparin levels only at that point  Follow CBC daily  Micala Saltsman, Toribio Harbour, PharmD 10/26/2016,6:39 PM

## 2016-10-26 NOTE — Progress Notes (Signed)
PT Cancellation Note  Patient Details Name: [name] [name] MRN: [id number] DOB: [birth year]   Cancelled Treatment:    Reason Eval/Treat Not Completed: Fatigue/lethargy limiting ability to participate (remains on a drip.)   Claretha Cooper 10/26/2016, 2:23 PM Tresa Endo PT [phone]

## 2016-10-26 NOTE — Progress Notes (Signed)
Name: [name] [name] MRN: [id number] DOB: 05-21-1941    ADMISSION DATE:  10/18/2016 CONSULTATION DATE:  2/8  REFERRING MD : [facility]  CHIEF COMPLAINT:  Cough   HISTORY OF PRESENT ILLNESS:   76 yo retired [employer] from [place] , never smoker, with PAF and on Eliquis,who presented 10/18/16 with 1 week of cough, green yellow sputum, fever 103, 10 lb weight loss coupled with poor appetite and general malaise. Admitted and treated for CAP with abx and on ct scan noted to have left > right effusions and PCCM called to consult.   SUBJECTIVE:  Cough a little better Still frustrated that he doesn't feel great   VITAL SIGNS: Temp:  [97.4 F (36.3 C)-98.7 F (37.1 C)] 97.4 F (36.3 C) (02/13 0458) Pulse Rate:  [70-108] 75 (02/13 0458) Resp:  [20] 20 (02/13 0458) BP: (122-132)/(50-86) 126/86 (02/13 0458) SpO2:  [93 %-98 %] 98 % (02/13 0458)  Room air   Intake/Output Summary (Last 24 hours) at 10/26/16 1014 Last data filed at 10/26/16 0458  Gross per 24 hour  Intake           820.96 ml  Output              875 ml  Net           -54.04 ml    PHYSICAL EXAMINATION: General appearance:  76 Year old  Male, well nourished, NAD, conversant  Eyes: anicteric sclerae, moist conjunctivae; PERRL, EOMI bilaterally. Mouth:  membranes and no mucosal ulcerations; normal hard and soft palate Neck: Trachea midline; neck supple, no JVD Lungs/chest: decreased BS on left, with normal respiratory effort and no intercostal retractions CV: irreg irreg , no MRGs  Abdomen: Soft, non-tender; no masses or HSM Extremities: mild 1+ peripheral edema or extremity lymphadenopathy Skin: Normal temperature, turgor and texture; no rash, ulcers or subcutaneous nodules Psych: Appropriate affect, alert and oriented to person, place and time   Recent Labs Lab 10/24/16 0433 10/25/16 0329 10/26/16 0505  NA 140 139 140  K 3.4* 3.4* 3.5  CL 107 105 105  CO2 28 27 28   BUN 14 11 13   CREATININE 0.68 0.80 0.83    GLUCOSE 110* 117* 113*    Recent Labs Lab 10/22/16 0525 10/24/16 0433  HGB 10.9* 11.7*  HCT 32.4* 34.6*  WBC 6.6 5.8  PLT 279 387   Dg Chest Port 1 View  Result Date: 10/26/2016 CLINICAL DATA:  Pneumonia . EXAM: PORTABLE CHEST 1 VIEW COMPARISON:  08/23/2017. FINDINGS: Mediastinum and heart size stable. Persistent left upper lobe infiltrate Persistent left lower lobe atelectasis and infiltrate. Small bilateral pleural effusions. IMPRESSION: 1. Persistent left upper lobe infiltrate. Persistent left lower lobe atelectasis and infiltrate. 2. Small bilateral pleural effusions. Electronically Signed   By: Marcello Moores  Register   On: 10/26/2016 06:58   Dg Chest Port 1 View  Result Date: 10/24/2016 CLINICAL DATA:  Pneumonia, cough, shortness of breath EXAM: PORTABLE CHEST 1 VIEW COMPARISON:  CT chest dated 10/20/2016 FINDINGS: Left upper lobe opacity/pneumonia. Mild patchy left lower lobe opacity, atelectasis versus pneumonia. Small bilateral pleural effusions. No pneumothorax. Cardiomegaly. IMPRESSION: Left upper lobe pneumonia. Left lower lobe opacity, atelectasis versus pneumonia. Small bilateral pleural effusions. Electronically Signed   By: Julian Hy M.D.   On: 10/24/2016 10:40  film personally reviewed 2/13: right side better. Still w/ sig LUL airspace disease and what appears to be left effusion/atx  Bedside US 2/13: reacummulation of left pleural effusion, appears to be free flowing.  ASSESSMENT Pneumococcal CAP w/ left parapneumonic effusion.  PAF w/ pulmonary edema H/o AS   Discussion  He looks about the same. Still frustrated that he is not better.  I think this may be more of an issue of his AF than the PNA. Also by [place] he has moderate left effusion again. Unfortunately he is back on eliquis so can't tap again at this point. I talked to cards team about diuresis   Plan Complete 14d abx kvo IVFs Lasix today (needs negative volume status) Cards following for rate control;   (they are considering cardioversion) Will repeat US in another 48hrs. Effusion looks free flowing. If persists we need to consider CT imaging. Will discuss timing of this w/ Dr Ashok Cordia.   Erick Colace ACNP-BC [place] Pager # [phone] OR # [phone] if no answer       10/26/2016, 10:14 AM

## 2016-10-26 NOTE — Progress Notes (Signed)
Initial Nutrition Assessment  DOCUMENTATION CODES:   Not applicable  INTERVENTION:  - Will order Boost Breeze TID to use with medication administration, each supplement provides 250 kcal and 9 grams of protein - Will order Ensure Enlive once/day, this supplement provides 350 kcal and 20 grams of protein - Will order daily multivitamin with minerals.  - Continue to encourage PO intakes of meals and supplements. - RD will continue to monitor for additional needs.  NUTRITION DIAGNOSIS:   Inadequate oral intake related to acute illness, poor appetite as evidenced by per patient/family report, meal completion < 50%.  GOAL:   Patient will meet greater than or equal to 90% of their needs  MONITOR:   PO intake, Supplement acceptance, Weight trends, Labs, I & O's  REASON FOR ASSESSMENT:   Rounds  ASSESSMENT:   76 y.o. male with medical history significant of hypertension, bicuspid aortic valve, dyslipidemia, proximal A. Fib. Patient presented with cough of about 1 week duration productive of yellowish-green sputum. Fever started on Saturday recorded temperatures of 100-103 degree Fahrenheit. With malaise, poor appetite for the past week. Patient has been taking his home blood pressure medications metoprolol 50 mg twice a day. Patient admits to weight loss over the past week of 10 pounds, denies ever smoking cigarettes, family history of lung cancer. No alcohol use or IV drug use. No chest pain, shortness of breath only with prolonged prolonged coughing.  Pt seen following rounds this AM. BMI indicates overweight status, appropriate for age. Per chart review, pt consumed 75% of breakfast, 0% lunch, 75% dinner on 2/10 and 25% of lunch yesterday; no other recent PO intakes documented. Pt reports that he is originally from [place]. Notes indicates HOH which is improving. Unsure whether difficulty in discussion with pt is related to language or HOH. When questions are asked to him concerning food,  food preferences, beverage preferences, appetite, and concerns about abdominal pain or nausea pt often talks about life events and also about breaking his back in October. Able to discern that appetite has been decreased x3 months but otherwise unable to obtain nutrition-related information at this time.  Spoke with RN about med pass and she states she has been giving pt water to take medications. Informed her of plan to order Boost Breeze to be provided, rather than water, when giving pt medications.  Unable to perform physical assessment at this time. Per chart review, pt gained 6 lbs from 11/28-12/15 and then subsequently lost 11 lbs (5.5% body weight) from 12/18-2/5; this is not significant for time frame but unable to determine if this weight loss was more acute than 2 month time-frame. Unable to state malnutrition at this time but pt is at very high risk.   Medications reviewed; 1000 units vitamin D/day, 40 mg IV Lasix x1 dose today, 1 bottle Mg citrate x1 dose today, 17 g Miralax/day PRN, 1 tablet Senokot PRN, PRN Dulcolax.  Labs reviewed; Ca: 8.1 mg/dL, AST/ALT elevated.    Diet Order:  Diet Heart Room service appropriate? Yes; Fluid consistency: Thin  Skin:  Reviewed, no issues  Last BM:  PTA/unknown  Height:   Ht Readings from Last 1 Encounters:  10/18/16 5\' 11"  (1.803 m)    Weight:   Wt Readings from Last 1 Encounters:  10/18/16 190 lb (86.2 kg)    Ideal Body Weight:  78.18 kg  BMI:  Body mass index is 26.5 kg/m.  Estimated Nutritional Needs:   Kcal:  1640-1810 (19-21 kcal/kg)  Protein:  70-80 grams  Fluid:  1.8 L/day  EDUCATION NEEDS:   No education needs identified at this time    Jarome Matin, MS, RD, LDN, [facility] Inpatient Clinical Dietitian Pager # [phone] After hours/weekend pager # [phone]

## 2016-10-26 NOTE — Progress Notes (Signed)
Spoke with pt and wife at bedside concerning [name] [name].  Pt's wife is not sure about HH at discharge.

## 2016-10-26 NOTE — Consult Note (Signed)
   [facility] CM Inpatient Consult   10/26/2016  [name] [name] Jul 04, 1941 [id number]    Patient screened for [place] Management services. Went to bedside to offer and explain [facility] Care Management program with [name] [name] and wife. [name] [name] pleasantly declined [place] Management program at this time. Accepted [facility] Care Management brochure with contact information to call in future if they change their mind. Made inpatient RNCM aware that [name] [name] declined [place] Management program services.  Marthenia Rolling, MSN-Ed, RN,BSN [facility] Liaison [phone]

## 2016-10-27 DIAGNOSIS — I359 Nonrheumatic aortic valve disorder, unspecified: Secondary | ICD-10-CM

## 2016-10-27 DIAGNOSIS — N179 Acute kidney failure, unspecified: Secondary | ICD-10-CM

## 2016-10-27 LAB — CBC
HCT: 36.3 % — ABNORMAL LOW (ref 39.0–52.0)
Hemoglobin: 12.1 g/dL — ABNORMAL LOW (ref 13.0–17.0)
MCH: 30.9 pg (ref 26.0–34.0)
MCHC: 33.3 g/dL (ref 30.0–36.0)
MCV: 92.8 fL (ref 78.0–100.0)
Platelets: 554 10*3/uL — ABNORMAL HIGH (ref 150–400)
RBC: 3.91 MIL/uL — ABNORMAL LOW (ref 4.22–5.81)
RDW: 13.6 % (ref 11.5–15.5)
WBC: 7.7 10*3/uL (ref 4.0–10.5)

## 2016-10-27 LAB — BASIC METABOLIC PANEL
Anion gap: 5 (ref 5–15)
BUN: 16 mg/dL (ref 6–20)
CO2: 30 mmol/L (ref 22–32)
Calcium: 8.3 mg/dL — ABNORMAL LOW (ref 8.9–10.3)
Chloride: 104 mmol/L (ref 101–111)
Creatinine, Ser: 0.74 mg/dL (ref 0.61–1.24)
GFR calc Af Amer: >60 mL/min (ref >60)
GFR calc non Af Amer: >60 mL/min (ref >60)
Glucose, Bld: 107 mg/dL — ABNORMAL HIGH (ref 65–99)
Potassium: 3.5 mmol/L (ref 3.5–5.1)
Sodium: 139 mmol/L (ref 135–145)

## 2016-10-27 LAB — APTT
aPTT: 55 seconds — ABNORMAL HIGH (ref 24–36)
aPTT: 70 seconds — ABNORMAL HIGH (ref 24–36)

## 2016-10-27 LAB — MAGNESIUM: Magnesium: 1.7 mg/dL (ref 1.7–2.4)

## 2016-10-27 LAB — HEPARIN LEVEL (UNFRACTIONATED)
Heparin Unfractionated: 0.84 IU/mL — ABNORMAL HIGH (ref 0.30–0.70)
Heparin Unfractionated: 0.72 IU/mL — ABNORMAL HIGH (ref 0.30–0.70)

## 2016-10-27 MED ORDER — HEPARIN (PORCINE) IN NACL 100-0.45 UNIT/ML-% IJ SOLN
1350.0000 [IU]/h | INTRAMUSCULAR | Status: AC
  Administered 2016-10-27 (×2): 1350 [IU]/h via INTRAVENOUS
  Filled 2016-10-27: qty 250

## 2016-10-27 MED ORDER — AMIODARONE HCL 200 MG PO TABS
200.0000 mg | ORAL_TABLET | Freq: Two times a day (BID) | ORAL | Status: DC
  Administered 2016-10-27 – 2016-10-28 (×3): 200 mg via ORAL
  Filled 2016-10-27 (×3): qty 1

## 2016-10-27 MED ORDER — MAGNESIUM CITRATE PO SOLN
1.0000 | Freq: Once | ORAL | Status: AC
Start: 2016-10-27 — End: 2016-10-27
  Administered 2016-10-27: 1 via ORAL
  Filled 2016-10-27: qty 296

## 2016-10-27 MED ORDER — DILTIAZEM HCL ER COATED BEADS 120 MG PO CP24: 120.0000 mg | ORAL_CAPSULE | Freq: Every day | ORAL | Status: DC

## 2016-10-27 NOTE — Progress Notes (Signed)
ANTICOAGULATION CONSULT NOTE - Follow Up Consult  Pharmacy Consult for Heparin Indication: atrial fibrillation  Allergies  Allergen Reactions  . Xarelto [Rivaroxaban] Other (See Comments)    INCREASED BP-HYPERTENSIVE EVENTS  . Benazepril Cough    Patient Measurements: Height: 5\' 11"  (180.3 cm) Weight: 190 lb (86.2 kg) IBW/kg (Calculated) : 75.3 Heparin Dosing Weight:   Vital Signs: Temp: 98.2 F (36.8 C) (02/14 0549) Temp Source: Oral (02/14 0549) BP: 134/90 (02/14 0549) Pulse Rate: 70 (02/14 0549)  Labs:  Recent Labs  10/25/16 0329 10/26/16 0505 10/26/16 1613 10/27/16 0540  HGB  --   --   --  12.1*  HCT  --   --   --  36.3*  PLT  --   --   --  554*  APTT  --   --  32 55*  HEPARINUNFRC  --   --  1.54* 0.84*  CREATININE 0.80 0.83  --  0.74    Estimated Creatinine Clearance: 83.7 mL/min (by C-G formula based on SCr of 0.74 mg/dL).   Medications:  Infusions:  . heparin 1,350 Units/hr (10/27/16 0700)    Assessment: Patient with high heparin level but PTT below goal.  PTT ordered with Heparin level until both correlate due to possible drug-lab interaction between oral anticoagulant (rivaroxaban, edoxaban, or apixaban) and anti-Xa level (aka heparin level)  No heparin issues per RN.  Goal of Therapy:  Heparin level 0.3-0.7 units/ml aPTT 66-102 seconds Monitor platelets by anticoagulation protocol: Yes   Plan:  Increase heparin to 1350 units/hr Recheck heparin level and PTT at [address] [place] 10/27/2016,7:00 AM

## 2016-10-27 NOTE — Progress Notes (Signed)
PROGRESS NOTE    [name] [name]  [id number] DOB: [birth year] DOA: 10/18/2016 PCP: [name], MD    Brief Narrative:  76 y.o.malewith medical history of hypertension, bicuspid aortic valve,dyslipidemia, proximal A. Fib. Presented with cough of about 1 week duration productive of yellowish-green sputum, Fever starting on Saturday of 100-103 degree Fahrenheit, with malaise &poor appetite for the past week. Chest x-ray will possible pneumonia or mass or aneurysm. Patient was started on ceftriaxone and azithromycin and given 3 L bolus of IV fluids. CT scan revealed extensive L sided pneumonia and no mass. Also had mod L effusion.  He has had complaints of cough, initially with hemoptysis, severe burning micturition (negative UA) & pain both thighs whenever he coughs. When he has coughing fits, he feels like he is dyging.  He underwent a L thoracentesis on 2/9 which showed a para pneumonic effusion. His has PAF and his HR has been uncontrolled. He has been in A-fib with RVR for 3 days in a row with rates > 130 treated with Metoprolol, Cardizem (oral and infusion), Dig and Amiodarone- it finally resolved yesterday but still having bursts of SVT- see below. Pulmonary and Cardiology following.   Assessment & Plan:   Principal Problem:   CAP (community acquired pneumonia) Active Problems:   Essential hypertension   Aortic valve disorder   PAF (paroxysmal atrial fibrillation) (HCC)   Occlusion and stenosis of carotid artery   Chest pain   Dyspnea   Ascending aortic aneurysm (HCC)   Bicuspid aortic valve   Sepsis (HCC)   AKI (acute kidney injury) ([place])   Parapneumonic effusion   Hemoptysis   Pneumonia of both lungs due to Streptococcus pneumoniae (HCC)   Pleural effusion, bilateral   S/P thoracentesis   Principal Problem:   Left streptococcal pneumoniae CAP- Sepsis- left parapneumonic pleural effusion - Presented with fever, cough and hemoptysis on admission -  CT chest to further  evaluate mass like opacity seen on CXR reveals extensive pneumonia and pleural effusion - Pulmonary consulted -  2/9-1 L of blood fluid drained form left lung and found to be parapneumonic - oxygen had been weaned off - strep Pneumo U antigen found to be positive  >> cont Rocephin - d/c'd Zithromax-- needs 14 days of Abx - PCCM initially recommend to switch to Levaquin on d/c  - Tussionex, Tessalon, Mucinex, Delsym for severe cough and congestion but still having cough - it is now less productive cough and hemoptysis resolved a few days ago - CXR done 2/11 which noted small effusions, however per bedside ultrasound by PCCM, effusion appears moderate on the left - PCCM had recommended holding eliquis x 24-48hrs with repeat thoracentesis - Stable at present  Active Problems: Paroxysmal A-fib with RVR - Earlier not improved with Cardizem infusion, Metoprolol, Oral cardizem and Dig - not improved with 1 L NS bolus and continuous fluids - not improved with Amio infusion 150 mg  - increased Toprol to 75 mg BID from 50 BID  - Cardiology following. - Pt has been transitioned to PO cardizem. Rate controlled this AM - Eliquis remains on hold for now pending possible repeat thoracentesis  Hypokalemia, hypomagnesemia  - replaced - Repeat bmet in AM   B/l hearing loss - improving with Flonase and Afrin - Stable at present  Urinary frequency with Burning - had reported dysuria early in this course - UA negative for infection, Urelle not helping therefore it was stopped - Have added Flomax for possibility of bladder outlet obstruction  AKI (acute kidney injury) ([place]) - prerenal- holding Ramipril and HCTZ - Cr has improved from 2.84 to normal limits   Mildly elevated Troponin/ Ao stenosis with bicuspid Ao Valve - likely due to resp distress - ECHO with findings of mod Ao stenosis- not significantly changed from prior    Essential hypertension - Per above, holding Ramipril, HCTZ- -  cont Metoprolol as tolerated    Ascending aortic aneurysm/   Bicuspid aortic valve - appears stable at this time  Constipation - No results with mag citrate, Miralax, and Dulcolax suppository - Will re-order mg citrate. Will order PRN soap suds enema  DVT prophylaxis: Heparin gtt Code Status: Full Family Communication: Pt in room, family not at bedside Disposition Plan: Uncertain at this time  Consultants:   Cardiology  Pulmonary  Procedures:  2 d ECHO 2D imaging suggests moderate aortic stenosis. Transaortic gradients were much higher on the study from April 2017. Suspect aortic valve gradients are underestimated on the current study due to poor beam alignment.  Thoracentesis  2/9  Antimicrobials: Anti-infectives    Start     Dose/Rate Route Frequency Ordered Stop   10/20/16 1000  azithromycin (ZITHROMAX) tablet 500 mg  Status:  Discontinued     500 mg Oral Daily 10/20/16 0813 10/22/16 1111   10/19/16 1200  azithromycin (ZITHROMAX) 500 mg in dextrose 5 % 250 mL IVPB  Status:  Discontinued     500 mg 250 mL/hr over 60 Minutes Intravenous Every 24 hours 10/18/16 1741 10/20/16 0813   10/19/16 1000  cefTRIAXone (ROCEPHIN) 1 g in dextrose 5 % 50 mL IVPB     1 g 100 mL/hr over 30 Minutes Intravenous Every 24 hours 10/18/16 1741     10/18/16 1345  oseltamivir (TAMIFLU) capsule 75 mg  Status:  Discontinued     75 mg Oral  Once 10/18/16 1340 10/18/16 1342   10/18/16 1345  oseltamivir (TAMIFLU) capsule 30 mg  Status:  Discontinued     30 mg Oral  Once 10/18/16 1342 10/18/16 1646   10/18/16 1315  oseltamivir (TAMIFLU) capsule 30 mg  Status:  Discontinued     30 mg Oral Once 10/18/16 1300 10/18/16 1340   10/18/16 1142  azithromycin (ZITHROMAX) 500 MG injection    Comments:  Peel, Adrienne   : cabinet override      10/18/16 1142 10/18/16 2344   10/18/16 1133  azithromycin (ZITHROMAX) 500 MG injection  Status:  Discontinued    Comments:  Peel, Adrienne   : cabinet  override      10/18/16 1133 10/18/16 1144   10/18/16 1130  cefTRIAXone (ROCEPHIN) 1 g in dextrose 5 % 50 mL IVPB     1 g 100 mL/hr over 30 Minutes Intravenous  Once 10/18/16 1127 10/18/16 1227   10/18/16 1130  azithromycin (ZITHROMAX) 500 mg in dextrose 5 % 250 mL IVPB     500 mg 250 mL/hr over 60 Minutes Intravenous  Once 10/18/16 1127 10/18/16 1337       Subjective: Complaining of constipation  Objective: Vitals:   10/26/16 0458 10/26/16 1500 10/26/16 2104 10/27/16 0549  BP: 126/86 117/71 136/74 134/90  Pulse: 75 84 91 70  Resp: 20 20 (!) 25 20  Temp: 97.4 F (36.3 C) 97.7 F (36.5 C) 98.1 F (36.7 C) 98.2 F (36.8 C)  TempSrc: Oral Oral Oral Oral  SpO2: 98% 95% 93% 95%  Weight:      Height:        Intake/Output Summary (Last  24 hours) at 10/27/16 1405 Last data filed at 10/27/16 1200  Gross per 24 hour  Intake              111 ml  Output             1275 ml  Net            -1164 ml   Filed Weights   10/18/16 0859  Weight: 86.2 kg (190 lb)    Examination:  General exam: Appears calm and comfortable  Respiratory system: Clear to auscultation. Respiratory effort normal. Cardiovascular system: S1 & S2 heard, RRR. Gastrointestinal system: Abdomen is nondistended, soft and nontender. No organomegaly or masses felt. Normal bowel sounds heard. Central nervous system: Alert and oriented. No focal neurological deficits. Extremities: Symmetric 5 x 5 power. Skin: No rashes, lesions Psychiatry: Judgement and insight appear normal. Mood & affect appropriate.   Data Reviewed: I have personally reviewed following labs and imaging studies  CBC:  Recent Labs Lab 10/22/16 0525 10/24/16 0433 10/27/16 0540  WBC 6.6 5.8 7.7  HGB 10.9* 11.7* 12.1*  HCT 32.4* 34.6* 36.3*  MCV 93.1 92.3 92.8  PLT 279 387 [id number]*   Basic Metabolic Panel:  Recent Labs Lab 10/23/16 0506 10/24/16 0433 10/25/16 0329 10/26/16 0505 10/27/16 0540  NA 138 140 139 140 139  K 3.5 3.4* 3.4*  3.5 3.5  CL 106 107 105 105 104  CO2 25 28 27 28 30   GLUCOSE 115* 110* 117* 113* 107*  BUN 15 14 11 13 16   CREATININE 0.77 0.68 0.80 0.83 0.74  CALCIUM 8.2* 7.8* 8.0* 8.1* 8.3*  MG  --   --  1.4* 1.7 1.7   GFR: Estimated Creatinine Clearance: 83.7 mL/min (by C-G formula based on SCr of 0.74 mg/dL). Liver Function Tests:  Recent Labs Lab 10/22/16 1117 10/25/16 0329  AST  --  46*  ALT  --  70*  ALKPHOS  --  55  BILITOT  --  0.9  PROT 6.7 6.5  ALBUMIN  --  2.4*   No results for input(s): LIPASE, AMYLASE in the last 168 hours. No results for input(s): AMMONIA in the last 168 hours. Coagulation Profile: No results for input(s): INR, PROTIME in the last 168 hours. Cardiac Enzymes: No results for input(s): CKTOTAL, CKMB, CKMBINDEX, TROPONINI in the last 168 hours. BNP (last 3 results) No results for input(s): PROBNP in the last 8760 hours. HbA1C: No results for input(s): HGBA1C in the last 72 hours. CBG: No results for input(s): GLUCAP in the last 168 hours. Lipid Profile: No results for input(s): CHOL, HDL, LDLCALC, TRIG, CHOLHDL, LDLDIRECT in the last 72 hours. Thyroid Function Tests: No results for input(s): TSH, T4TOTAL, FREET4, T3FREE, THYROIDAB in the last 72 hours. Anemia Panel: No results for input(s): VITAMINB12, FOLATE, FERRITIN, TIBC, IRON, RETICCTPCT in the last 72 hours. Sepsis Labs: No results for input(s): PROCALCITON, LATICACIDVEN in the last 168 hours.  Recent Results (from the past 240 hour(s))  MRSA PCR Screening     Status: None   Collection Time: 10/18/16  3:38 PM  Result Value Ref Range Status   MRSA by PCR NEGATIVE NEGATIVE Final    Comment:        The GeneXpert MRSA Assay (FDA approved for NASAL specimens only), is one component of a comprehensive MRSA colonization surveillance program. It is not intended to diagnose MRSA infection nor to guide or monitor treatment for MRSA infections.   Culture, blood (Routine X 2) w Reflex to  ID Panel      Status: None   Collection Time: 10/18/16  6:29 PM  Result Value Ref Range Status   Specimen Description BLOOD BLOOD RIGHT FOREARM  Final   Special Requests IN PEDIATRIC BOTTLE 4 CC  Final   Culture   Final    NO GROWTH 5 DAYS Performed at [facility], [place] [address] [place], [place] [address]    Report Status 10/23/2016 FINAL  Final  Culture, blood (Routine X 2) w Reflex to ID Panel     Status: None   Collection Time: 10/18/16  6:29 PM  Result Value Ref Range Status   Specimen Description BLOOD LEFT ANTECUBITAL  Final   Special Requests BOTTLES DRAWN AEROBIC ONLY 5 CC  Final   Culture   Final    NO GROWTH 5 DAYS Performed at [facility], [place] [address] [place], [place] [address]    Report Status 10/23/2016 FINAL  Final  Body fluid culture     Status: None   Collection Time: 10/22/16 11:25 AM  Result Value Ref Range Status   Specimen Description PLEURAL LEFT  Final   Special Requests NONE  Final   Gram Stain   Final    RARE WBC PRESENT, PREDOMINANTLY MONONUCLEAR NO ORGANISMS SEEN    Culture   Final    NO GROWTH 3 DAYS Performed at [facility], [place] [address] [place], [place] [address]    Report Status 10/26/2016 FINAL  Final     Radiology Studies: Dg Chest Port 1 View  Result Date: 10/26/2016 CLINICAL DATA:  Pneumonia . EXAM: PORTABLE CHEST 1 VIEW COMPARISON:  08/23/2017. FINDINGS: Mediastinum and heart size stable. Persistent left upper lobe infiltrate Persistent left lower lobe atelectasis and infiltrate. Small bilateral pleural effusions. IMPRESSION: 1. Persistent left upper lobe infiltrate. Persistent left lower lobe atelectasis and infiltrate. 2. Small bilateral pleural effusions. Electronically Signed   By: Marcello Moores  Register   On: 10/26/2016 06:58    Scheduled Meds: . benzonatate  200 mg Oral TID  . cefTRIAXone (ROCEPHIN)  IV  1 g Intravenous Q24H  . chlorpheniramine-HYDROcodone  5 mL Oral Q8H  . cholecalciferol  1,000 Units Oral Daily  .  dextromethorphan  60 mg Oral BID  . diclofenac sodium  2 g Topical QID  . digoxin  0.125 mg Oral Daily  . [START ON 10/28/2016] diltiazem  120 mg Oral Daily  . diltiazem  240 mg Oral Daily  . feeding supplement  1 Container Oral TID BM  . feeding supplement (ENSURE ENLIVE)  237 mL Oral Q24H  . fluticasone  2 spray Each Nare Daily  . magnesium citrate  1 Bottle Oral Once  . mouth rinse  15 mL Mouth Rinse BID  . metoprolol succinate  75 mg Oral BID  . multivitamins with iron  1 tablet Oral Daily  . oxymetazoline  1 spray Each Nare BID  . sodium chloride  250 mL Intravenous Once  . tamsulosin  0.4 mg Oral Daily   Continuous Infusions: . heparin 1,350 Units/hr (10/27/16 0700)     LOS: 9 days   Harlee Eckroth, Orpah Melter, MD [facility] Pager [phone]  If 7PM-7AM, please contact night-coverage [email or web address] Password [email or web address] 10/27/2016, 2:05 PM

## 2016-10-27 NOTE — Progress Notes (Signed)
The patient has been seen in conjunction with Reino Bellis, NP-C. All aspects of care have been considered and discussed. The patient has been personally interviewed, examined, and all clinical data has been reviewed.   He is currently in sinus rhythm.  Since he has auto converted to NSR, I will add amiodarone to help maintain sinus rhythm and we will decrease the doses of other medications (diltiazem/metoprolol: Hold if HR < 55 bpm. Will DC digoxin) to avoid excessive bradycardia.  We will continue to follow.   Progress Note  Patient Name: [name] [name] Date of Encounter: 10/27/2016  Primary Cardiologist: Dr Aundra Dubin 08/27/2016  Subjective   States he does feel somewhat better this morning. Was able to sleep better last night.   Inpatient Medications    Scheduled Meds: . benzonatate  200 mg Oral TID  . cefTRIAXone (ROCEPHIN)  IV  1 g Intravenous Q24H  . chlorpheniramine-HYDROcodone  5 mL Oral Q8H  . cholecalciferol  1,000 Units Oral Daily  . dextromethorphan  60 mg Oral BID  . diclofenac sodium  2 g Topical QID  . digoxin  0.125 mg Oral Daily  . diltiazem  120 mg Oral Daily  . diltiazem  240 mg Oral Daily  . feeding supplement  1 Container Oral TID BM  . feeding supplement (ENSURE ENLIVE)  237 mL Oral Q24H  . fluticasone  2 spray Each Nare Daily  . mouth rinse  15 mL Mouth Rinse BID  . metoprolol succinate  75 mg Oral BID  . multivitamins with iron  1 tablet Oral Daily  . oxymetazoline  1 spray Each Nare BID  . sodium chloride  250 mL Intravenous Once  . tamsulosin  0.4 mg Oral Daily   Continuous Infusions: . heparin 1,350 Units/hr (10/27/16 0700)   PRN Meds: acetaminophen **OR** acetaminophen, albuterol, bisacodyl, HYDROmorphone (DILAUDID) injection, menthol-cetylpyridinium, polyethylene glycol, senna-docusate   Vital Signs    Vitals:   10/26/16 0458 10/26/16 1500 10/26/16 2104 10/27/16 0549  BP: 126/86 117/71 136/74 134/90  Pulse: 75 84 91 70  Resp: 20 20  (!) 25 20  Temp: 97.4 F (36.3 C) 97.7 F (36.5 C) 98.1 F (36.7 C) 98.2 F (36.8 C)  TempSrc: Oral Oral Oral Oral  SpO2: 98% 95% 93% 95%  Weight:      Height:        Intake/Output Summary (Last 24 hours) at 10/27/16 0755 Last data filed at 10/27/16 [id number]  Gross per 24 hour  Intake              471 ml  Output             1425 ml  Net             -954 ml   Filed Weights   10/18/16 0859  Weight: 190 lb (86.2 kg)    Telemetry    Atrial flutter/fib Rates 80-90s - Personally Reviewed  ECG    n/a - Personally Reviewed  Physical Exam   General: Well developed, well nourished, male NAD. Resting until awoken. Head: Normocephalic, atraumatic. Blood around both nares Neck: Supple without bruits, no JVD. Lungs:  Resp regular and unlabored, decreased BS base L>R. Heart: Irreg R&R, S1, S2, no S3, S4, 2/6 murmur; no rub. Abdomen: Soft, non-tender, non-distended with normoactive bowel sounds. No hepatomegaly. No rebound/guarding. No obvious abdominal masses. Extremities: No clubbing, cyanosis, 1+ bilateral LE edema. Distal pedal pulses are 2+ bilaterally. Neuro: Alert and oriented X 3. Moves all extremities spontaneously.  Psych: Normal affect.  Labs    Chemistry Recent Labs Lab 10/22/16 1117  10/25/16 0329 10/26/16 0505 10/27/16 0540  NA  --   < > 139 140 139  K  --   < > 3.4* 3.5 3.5  CL  --   < > 105 105 104  CO2  --   < > 27 28 30   GLUCOSE  --   < > 117* 113* 107*  BUN  --   < > 11 13 16   CREATININE  --   < > 0.80 0.83 0.74  CALCIUM  --   < > 8.0* 8.1* 8.3*  PROT 6.7  --  6.5  --   --   ALBUMIN  --   --  2.4*  --   --   AST  --   --  46*  --   --   ALT  --   --  70*  --   --   ALKPHOS  --   --  55  --   --   BILITOT  --   --  0.9  --   --   GFRNONAA  --   < > >60 >60 >60  GFRAA  --   < > >60 >60 >60  ANIONGAP  --   < > 7 7 5   < > = values in this interval not displayed.  Magnesium  Date Value Ref Range Status  10/27/2016 1.7 1.7 - 2.4 mg/dL Final    Hematology  Recent Labs Lab 10/22/16 0525 10/24/16 0433 10/27/16 0540  WBC 6.6 5.8 7.7  RBC 3.48* 3.75* 3.91*  HGB 10.9* 11.7* 12.1*  HCT 32.4* 34.6* 36.3*  MCV 93.1 92.3 92.8  MCH 31.3 31.2 30.9  MCHC 33.6 33.8 33.3  RDW 13.7 13.3 13.6  PLT 279 387 554*    Cardiac EnzymesNo results for input(s): TROPONINI in the last 168 hours.   Radiology    Dg Chest Port 1 View  Result Date: 10/26/2016 CLINICAL DATA:  Pneumonia . EXAM: PORTABLE CHEST 1 VIEW COMPARISON:  08/23/2017. FINDINGS: Mediastinum and heart size stable. Persistent left upper lobe infiltrate Persistent left lower lobe atelectasis and infiltrate. Small bilateral pleural effusions. IMPRESSION: 1. Persistent left upper lobe infiltrate. Persistent left lower lobe atelectasis and infiltrate. 2. Small bilateral pleural effusions. Electronically Signed   By: Marcello Moores  Register   On: 10/26/2016 06:58    Cardiac Studies   ECHO: 10/21/2016 - Left ventricle: The cavity size was normal. There was mild   concentric hypertrophy. Systolic function was normal. The   estimated ejection fraction was in the range of 55% to 60%. Wall   motion was normal; there were no regional wall motion   abnormalities. - Aortic valve: Functionally bicuspid (fused right and left cusps);   moderately thickened, mildly calcified leaflets. There was   moderate stenosis by 2D imaging. The recorded Doppler velocities   are probably an underestimation of true transvalvular gradients.   There was trivial regurgitation. Valve area (VTI): 2.95 cm^2.   Valve area (Vmax): 2.51 cm^2. Valve area (Vmean): 2.39 cm^2. - Mitral valve: There was mild regurgitation directed centrally. - Left atrium: The atrium was moderately dilated. - Atrial septum: No defect or patent foramen ovale was identified. Impressions: - 2D imaging suggests moderate aortic stenosis. Transaortic   gradients were much higher on the study from April 2017. Suspect   aortic valve gradients  are underestimated on the current study   due to poor beam alignment.  Recommendations:  Consider TEE if necessary to better evaluate the aortic valve.   Patient Profile     76 y.o. male with a history of bicuspid AoV, mod AS, PAF on Eliquis, HTN, HLD, 4.4 cm AAA was admitted 02/05 with fever, hyotension, ?PNA. Pt went into rapid afib 02/10>>cards consult  Assessment & Plan    1.  PAF (paroxysmal atrial fibrillation)  - IV Cardizem stopped yesterday, remains on PO 240mg  daily, metoprolol, lanoxin. Cardizem 120mg  daily at bedtime added yesterday for 10days. Rates actually improved at this time in the 80-90s.  - Dr Aundra Dubin was considering "pill in the pocket" flecainide  - on Eliquis for [facility], but has been held in anticipation for possible thoracentesis. Currently on IV heparin.   2. Aortic stenosis, bicuspid valve - S2 heard clearly - no sx clearly attributed to this - follow  3.  Hypokalemia - resolved this am  4.  Hypomagnesemia - improved  5.  CAP  - s/p thoracentesis w/ 1 L parapneumonic fluid removed 10/22/16. Eliquis held yesterday with plans for possible repeat thoracentesis. - per IM/CCM  6. Diastolic HF: BNP elevated yesterday, and noted LE edema primarily around ankles.  Net + on I&Os. CXR 10/26/16 with continued small bilateral effusions. -- Given 40mg  IV lasix yesterday with 1.4L UOP. Cr and blood pressure stable.   Otherwise, per IM Active Problems:   Essential hypertension   Aortic valve disorder   Occlusion and stenosis of carotid artery   Chest pain   Dyspnea   Ascending aortic aneurysm (HCC)   Bicuspid aortic valve   Sepsis ([place])   AKI (acute kidney injury) ([place])   Parapneumonic effusion   Hemoptysis  Signed, Reino Bellis , NP 7:55 AM 10/27/2016

## 2016-10-27 NOTE — Progress Notes (Signed)
   Name: [name] [name] MRN: [id number] DOB: 04/08/1941    ADMISSION DATE:  10/18/2016 CONSULTATION DATE:  2/8  REFERRING MD : [facility]  CHIEF COMPLAINT:  Cough   HISTORY OF PRESENT ILLNESS:   76 yo retired [employer] from [place] , never smoker, with PAF and on Eliquis,who presented 10/18/16 with 1 week of cough, green yellow sputum, fever 103, 10 lb weight loss coupled with poor appetite and general malaise. Admitted and treated for CAP with abx and on ct scan noted to have left > right effusions and PCCM called to consult.   SUBJECTIVE:  Still coughing - mostly dry Maybe a little better Afebrile HR controlled  VITAL SIGNS: Temp:  [97.5 F (36.4 C)-98.2 F (36.8 C)] 97.5 F (36.4 C) (02/14 1429) Pulse Rate:  [69-91] 69 (02/14 1429) Resp:  [15-25] 15 (02/14 1429) BP: (117-144)/(68-90) 144/68 (02/14 1429) SpO2:  [93 %-95 %] 95 % (02/14 1429)  Room air   Intake/Output Summary (Last 24 hours) at 10/27/16 1454 Last data filed at 10/27/16 1200  Gross per 24 hour  Intake              111 ml  Output             1275 ml  Net            -1164 ml    PHYSICAL EXAMINATION: General appearance:  76 Year old  Male, well nourished, NAD, conversant  Eyes: anicteric sclerae, moist conjunctivae; PERRL, EOMI bilaterally. Mouth:  membranes and no mucosal ulcerations; normal hard and soft palate Neck: Trachea midline; neck supple, no JVD Lungs/chest: decreased BS on left, with normal respiratory effort and no intercostal retractions CV: irreg irreg , no MRGs  Abdomen: Soft, non-tender; no masses or HSM Extremities: mild 1+ peripheral edema or extremity lymphadenopathy Skin: Normal temperature, turgor and texture; no rash, ulcers or subcutaneous nodules Psych: Appropriate affect, alert and oriented to person, place and time   Recent Labs Lab 10/25/16 0329 10/26/16 0505 10/27/16 0540  NA 139 140 139  K 3.4* 3.5 3.5  CL 105 105 104  CO2 27 28 30   BUN 11 13 16   CREATININE 0.80  0.83 0.74  GLUCOSE 117* 113* 107*    Recent Labs Lab 10/22/16 0525 10/24/16 0433 10/27/16 0540  HGB 10.9* 11.7* 12.1*  HCT 32.4* 34.6* 36.3*  WBC 6.6 5.8 7.7  PLT 279 387 554*   Dg Chest Port 1 View  Result Date: 10/26/2016 CLINICAL DATA:  Pneumonia . EXAM: PORTABLE CHEST 1 VIEW COMPARISON:  08/23/2017. FINDINGS: Mediastinum and heart size stable. Persistent left upper lobe infiltrate Persistent left lower lobe atelectasis and infiltrate. Small bilateral pleural effusions. IMPRESSION: 1. Persistent left upper lobe infiltrate. Persistent left lower lobe atelectasis and infiltrate. 2. Small bilateral pleural effusions. Electronically Signed   By: Marcello Moores  Register   On: 10/26/2016 06:58    Bedside US 2/13: reacummulation of left pleural effusion, appears to be free flowing.  ASSESSMENT Pneumococcal CAP w/ left parapneumonic effusion.  PAF w/ pulmonary edema  -rate controlled on cardizem H/o AS   Discussion  Improved, but effusion coming back  Plan Complete 14d abx Will repeat US chest at bedside  tomorrow - hold heparin in anticipation around 4 am     Kara Mead MD. Shade Flood. [facility] Pager [phone] If no response call [phone]    10/27/2016, 2:54 PM

## 2016-10-27 NOTE — Progress Notes (Addendum)
ANTICOAGULATION CONSULT NOTE - Follow Up Consult  Pharmacy Consult for Heparin Indication: atrial fibrillation  Allergies  Allergen Reactions  . Xarelto [Rivaroxaban] Other (See Comments)    INCREASED BP-HYPERTENSIVE EVENTS  . Benazepril Cough    Patient Measurements: Height: 5\' 11"  (180.3 cm) Weight: 190 lb (86.2 kg) IBW/kg (Calculated) : 75.3 Heparin Dosing Weight:   Vital Signs: Temp: 97.5 F (36.4 C) (02/14 1429) Temp Source: Oral (02/14 1429) BP: 144/68 (02/14 1429) Pulse Rate: 69 (02/14 1429)  Labs:  Recent Labs  10/25/16 0329 10/26/16 0505 10/26/16 1613 10/27/16 0540 10/27/16 1524  HGB  --   --   --  12.1*  --   HCT  --   --   --  36.3*  --   PLT  --   --   --  554*  --   APTT  --   --  32 55* 70*  HEPARINUNFRC  --   --  1.54* 0.84* 0.72*  CREATININE 0.80 0.83  --  0.74  --     Estimated Creatinine Clearance: 83.7 mL/min (by C-G formula based on SCr of 0.74 mg/dL).   Medications:  Infusions:  . heparin 1,350 Units/hr (10/27/16 0700)    Assessment: Patient with high heparin level but aPTT at goal, aPTT is 70.  PTT ordered with Heparin level until both correlate due to possible drug-lab interaction between oral anticoagulant (rivaroxaban, edoxaban, or apixaban) and anti-Xa level (aka heparin level)  No heparin issues per RN.  Goal of Therapy:  Heparin level 0.3-0.7 units/ml aPTT 66-102 seconds Monitor platelets by anticoagulation protocol: Yes   Plan:   Continue heparin at 1350 units/hr  Current plan is to stop heparin drip at 4am on 2/15   [name], PharmD, BCPS Pager [phone] 10/27/2016 5:08 PM

## 2016-10-28 ENCOUNTER — Inpatient Hospital Stay (HOSPITAL_COMMUNITY): Payer: Medicare Other

## 2016-10-28 ENCOUNTER — Encounter (HOSPITAL_COMMUNITY): Payer: Self-pay | Admitting: Cardiology

## 2016-10-28 DIAGNOSIS — I712 Thoracic aortic aneurysm, without rupture: Secondary | ICD-10-CM

## 2016-10-28 LAB — COMPREHENSIVE METABOLIC PANEL
ALT: 66 U/L — ABNORMAL HIGH (ref 17–63)
AST: 58 U/L — ABNORMAL HIGH (ref 15–41)
Albumin: 2.7 g/dL — ABNORMAL LOW (ref 3.5–5.0)
Alkaline Phosphatase: 70 U/L (ref 38–126)
Anion gap: 6 (ref 5–15)
BUN: 20 mg/dL (ref 6–20)
CO2: 31 mmol/L (ref 22–32)
Calcium: 8.6 mg/dL — ABNORMAL LOW (ref 8.9–10.3)
Chloride: 103 mmol/L (ref 101–111)
Creatinine, Ser: 0.89 mg/dL (ref 0.61–1.24)
GFR calc Af Amer: >60 mL/min (ref >60)
GFR calc non Af Amer: >60 mL/min (ref >60)
Glucose, Bld: 115 mg/dL — ABNORMAL HIGH (ref 65–99)
Potassium: 3.8 mmol/L (ref 3.5–5.1)
Sodium: 140 mmol/L (ref 135–145)
Total Bilirubin: 0.6 mg/dL (ref 0.3–1.2)
Total Protein: 6.6 g/dL (ref 6.5–8.1)

## 2016-10-28 LAB — CBC
HCT: 35 % — ABNORMAL LOW (ref 39.0–52.0)
Hemoglobin: 11.7 g/dL — ABNORMAL LOW (ref 13.0–17.0)
MCH: 31.2 pg (ref 26.0–34.0)
MCHC: 33.4 g/dL (ref 30.0–36.0)
MCV: 93.3 fL (ref 78.0–100.0)
Platelets: 553 10*3/uL — ABNORMAL HIGH (ref 150–400)
RBC: 3.75 MIL/uL — ABNORMAL LOW (ref 4.22–5.81)
RDW: 13.6 % (ref 11.5–15.5)
WBC: 7.2 10*3/uL (ref 4.0–10.5)

## 2016-10-28 LAB — BODY FLUID CELL COUNT WITH DIFFERENTIAL
Eos, Fluid: 1 %
Lymphs, Fluid: 46 %
Monocyte-Macrophage-Serous Fluid: 10 % — ABNORMAL LOW (ref 50–90)
Neutrophil Count, Fluid: 13 % (ref 0–25)
Total Nucleated Cell Count, Fluid: 737 cu mm (ref 0–1000)

## 2016-10-28 LAB — BASIC METABOLIC PANEL
Anion gap: 6 (ref 5–15)
BUN: 19 mg/dL (ref 6–20)
CO2: 30 mmol/L (ref 22–32)
Calcium: 8.8 mg/dL — ABNORMAL LOW (ref 8.9–10.3)
Chloride: 105 mmol/L (ref 101–111)
Creatinine, Ser: 0.82 mg/dL (ref 0.61–1.24)
GFR calc Af Amer: >60 mL/min (ref >60)
GFR calc non Af Amer: >60 mL/min (ref >60)
Glucose, Bld: 109 mg/dL — ABNORMAL HIGH (ref 65–99)
Potassium: 3.7 mmol/L (ref 3.5–5.1)
Sodium: 141 mmol/L (ref 135–145)

## 2016-10-28 LAB — LACTATE DEHYDROGENASE, PLEURAL OR PERITONEAL FLUID: LD, Fluid: 103 U/L — ABNORMAL HIGH (ref 3–23)

## 2016-10-28 LAB — LACTATE DEHYDROGENASE: LDH: 106 U/L (ref 98–192)

## 2016-10-28 LAB — GLUCOSE, PLEURAL OR PERITONEAL FLUID: Glucose, Fluid: 110 mg/dL

## 2016-10-28 MED ORDER — TAMSULOSIN HCL 0.4 MG PO CAPS: 0.4000 mg | ORAL_CAPSULE | Freq: Every day | ORAL | 0 refills | Status: DC

## 2016-10-28 MED ORDER — APIXABAN 5 MG PO TABS: 5.0000 mg | ORAL_TABLET | Freq: Two times a day (BID) | ORAL | Status: DC

## 2016-10-28 MED ORDER — AMIODARONE HCL 200 MG PO TABS: 200.0000 mg | ORAL_TABLET | Freq: Every day | ORAL | 0 refills | Status: DC

## 2016-10-28 MED ORDER — AMIODARONE HCL 200 MG PO TABS: 200.0000 mg | ORAL_TABLET | Freq: Two times a day (BID) | ORAL | 0 refills | Status: DC

## 2016-10-28 NOTE — Progress Notes (Signed)
No change from am assessment. Patient remains a&ox4, ambulatory with walker. Discharge instruction reviewed with pt and spouse. Questions concerns denied after instruction.

## 2016-10-28 NOTE — Progress Notes (Signed)
Name: [name] [name] MRN: [id number] DOB: 12-06-1940    ADMISSION DATE:  10/18/2016 CONSULTATION DATE:  2/8  REFERRING MD : [facility]  CHIEF COMPLAINT:  Cough   HISTORY OF PRESENT ILLNESS:   76 yo retired [place] from [place] , never smoker, with PAF and on Eliquis,who presented 10/18/16 with 1 week of cough, green yellow sputum, fever 103, 10 lb weight loss coupled with poor appetite and general malaise. Admitted and treated for CAP with abx and on ct scan noted to have left > right effusions and PCCM called to consult.   SUBJECTIVE:  cough better Afebrile HR controlled, heparin stopped at 4a  VITAL SIGNS: Temp:  [97.5 F (36.4 C)-98.1 F (36.7 C)] 98.1 F (36.7 C) (02/15 0553) Pulse Rate:  [66-70] 66 (02/15 0553) Resp:  [15-19] 19 (02/15 0553) BP: (128-144)/(68-76) 132/76 (02/15 0553) SpO2:  [92 %-95 %] 93 % (02/15 0553)  Room air   Intake/Output Summary (Last 24 hours) at 10/28/16 1045 Last data filed at 10/28/16 0900  Gross per 24 hour  Intake            496.5 ml  Output              725 ml  Net           -228.5 ml    PHYSICAL EXAMINATION: General appearance:  76 Year old  Male, well nourished, NAD, conversant  Eyes: anicteric sclerae, moist conjunctivae; PERRL, EOMI bilaterally. Mouth:  membranes and no mucosal ulcerations; normal hard and soft palate Neck: Trachea midline; neck supple, no JVD Lungs/chest: decreased BS on left, with normal respiratory effort and no intercostal retractions CV: irreg irreg , no MRGs  Abdomen: Soft, non-tender; no masses or HSM Extremities: mild 1+ peripheral edema or extremity lymphadenopathy Skin: Normal temperature, turgor and texture; no rash, ulcers or subcutaneous nodules Psych: Appropriate affect, alert and oriented to person, place and time   Recent Labs Lab 10/26/16 0505 10/27/16 0540 10/28/16 0524  NA 140 139 141  K 3.5 3.5 3.7  CL 105 104 105  CO2 28 30 30   BUN 13 16 19   CREATININE 0.83 0.74 0.82    GLUCOSE 113* 107* 109*    Recent Labs Lab 10/24/16 0433 10/27/16 0540 10/28/16 0524  HGB 11.7* 12.1* 11.7*  HCT 34.6* 36.3* 35.0*  WBC 5.8 7.7 7.2  PLT 387 554* 553*   Dg Chest 2 View  Result Date: 10/28/2016 CLINICAL DATA:  Pleural effusion. EXAM: CHEST  2 VIEW COMPARISON:  10/26/2016. FINDINGS: Left upper lobe infiltrate is again noted. Slight improvement from prior exam. Small left pleural effusion noted, unchanged. Low lung volumes with basilar atelectasis. Stable cardiomegaly stable lower thoracic vertebral body compression fracture. IMPRESSION: 1. Persistent left upper lobe infiltrate, slight clearing from prior exam. Persistent small left pleural effusion unchanged. 2. Low lung volumes with basilar atelectasis. 3. Stable cardiomegaly. Electronically Signed   By: Marcello Moores  Register   On: 10/28/2016 10:00    Bedside US 2/13: reacummulation of left pleural effusion, appears to be free flowing.  ASSESSMENT Pneumococcal CAP w/ left parapneumonic effusion.  PAF w/ pulmonary edema  -rate controlled on cardizem H/o AS   Discussion  Improved, but left  effusion coming back  Plan Complete 14d abx repeat US chest at bedside>> small left effusion - will perform diagnostic tap to check - if not empyema -can discharge on oral antibiotics to complete course with FU CXR to resolution     Kara Mead MD. FCCP. [facility] Pager [phone] If no response call [phone]    10/28/2016, 10:45 AM

## 2016-10-28 NOTE — Procedures (Addendum)
Thoracentesis Procedure Note  Pre-operative Diagnosis: leftparapneumonic effusion  Post-operative Diagnosis: same  Indications: recurrence of left effusion  Bedside US used to mark point for thoracentesis  Procedure Details  Consent: Informed consent was obtained. Risks of the procedure were discussed including: infection, bleeding, pain, pneumothorax.  Under sterile conditions the patient was positioned. Betadine solution and sterile drapes were utilized.  2% buffered lidocaine was used to anesthetize the left 8th  rib space. Fluid was obtained without any difficulties and minimal blood loss.  A dressing was applied to the wound and wound care instructions were provided.   Findings 800 ml of clear pleural fluid was obtained. A sample was sent to Pathology for chemistry,cell counts, as well as for infection analysis.  Complications:  None; patient tolerated the procedure well.   Post procedure [place] did not show any pneumothorax        Condition: stable, CXR no pneumothorax  Plan A follow up chest x-ray was ordered. Bed Rest for 1 hours. Tylenol 650 mg. for pain.  Attending Attestation: I performed the procedure.

## 2016-10-28 NOTE — Discharge Summary (Signed)
Physician Discharge Summary  [name] [name] [id number] DOB: Apr 12, 1941 DOA: 10/18/2016  PCP: [name], MD  Admit date: 10/18/2016 Discharge date: 10/28/2016  Admitted From: Home Disposition:  Home  Recommendations for Outpatient Follow-up:  1. Follow up with PCP in 2-3 weeks 2. Follow up with Pulmonary as scheduled 3. Follow up with Cardiology as scheduled  Discharge Condition:Improved CODE STATUS:Full Diet recommendation: Heart healthy   Brief/Interim Summary: 76 y.o.malewith medical history of hypertension, bicuspid aortic valve,dyslipidemia, proximal A. Fib. Presented with cough of about 1 week duration productive of yellowish-green sputum, Fever starting on Saturday of 100-103 degree Fahrenheit, with malaise &poor appetite for the past week. Chest x-ray will possible pneumonia or mass or aneurysm. Patient was started on ceftriaxone and azithromycin and given 3 L bolus of IV fluids. CT scan revealed extensive L sided pneumonia and no mass. Also had mod L effusion. He has had complaints of cough, initially with hemoptysis, severe burning micturition (negative UA) &pain both thighs whenever he coughs. When he has coughing fits, he feels like he is dyging.  He underwent a L thoracentesis on 2/9 which showed a para pneumonic effusion. His has PAF and his HR has been uncontrolled. He has been in A-fib with RVR for 3 days in a row with rates >130 treated with Metoprolol, Cardizem (oral and infusion), Dig and Amiodarone- it finally resolved yesterday but still having bursts of SVT- see below. Pulmonary and Cardiology following.   Principal Problem: Left streptococcal pneumoniae CAP- Sepsis- left parapneumonic pleural effusion - Presented with fever, coughand hemoptysis on admission - CT chest to further evaluate mass like opacity seen on CXRreveals extensive pneumonia and pleural effusion - Pulmonary was consulted - 2/9-1 L of blood fluid drained form left lung and found to be  parapneumonic - oxygen had been weaned off - strep Pneumo U antigen found to be positive. Pulmonary recommended transitioning to PO levaquin to complete 14 days of Abx (4 more days treatment after discharge) - CXR done 2/11 which noted small effusions, however per bedside ultrasound by PCCM, effusion appears moderate on the left -Pt underwent repeat US guided thoracentesis on 2/15 yielding 800cc of clear fluid with fluid analysis to be followed up as outpatient.  Active Problems: Paroxysmal A-fib with RVR - Earlier not improved with Cardizem infusion, Metoprolol, Oral cardizem and Dig - Initially not improved with Amio infusion 150 mg  - Had increased Toprol to 75 mg BID from 50 BID -  Pt has been transitioned to PO cardizem. Rate controlled this AM - Eliquis resumed following thoracentesis. - Cardiology recommends amiodarone 200mg  bid x 2 weeks, followed by 200mg  daily, resuming metoprolol 50mg  bid per home regimen  Hypokalemia, hypomagnesemia  - replaced  B/l hearing loss - improved with Flonase and Afrin - Stable at present  Urinary frequency with Burning - had reported dysuria early in this course - UA negative for infection, Urelle not helping therefore it was stopped - Flomax was added for possibility of bladder outlet obstruction  AKI (acute kidney injury) ([place]) - prerenal- initially held Ramipril and HCTZ - Cr has improved from 2.84 to normal limits  Mildly elevated Troponin/ Ao stenosis with bicuspid Ao Valve - likely due to resp distress -ECHO with findings of mod Ao stenosis- not significantly changed from prior  Essential hypertension - Per above, initially held Ramipril, HCTZ- - cont Metoprolol as tolerated - Would resume ramipril at time of discharge  Ascending aortic aneurysm/ Bicuspid aortic valve - appears stable at this time  Constipation -  Improved with multiple cathartics  Discharge Diagnoses:  Principal Problem:   CAP (community  acquired pneumonia) Active Problems:   Essential hypertension   Aortic valve disorder   PAF (paroxysmal atrial fibrillation) (HCC)   Occlusion and stenosis of carotid artery   Chest pain   Dyspnea   Ascending aortic aneurysm (HCC)   Bicuspid aortic valve   Sepsis (HCC)   AKI (acute kidney injury) ([place])   Parapneumonic effusion   Hemoptysis   Pneumonia of both lungs due to Streptococcus pneumoniae (HCC)   Pleural effusion, bilateral   S/P thoracentesis    Discharge Instructions   Allergies as of 10/28/2016      Reactions   Xarelto [rivaroxaban] Other (See Comments)   INCREASED BP-HYPERTENSIVE EVENTS   Benazepril Cough      Medication List    STOP taking these medications   hydrochlorothiazide 12.5 MG capsule Commonly known as:  MICROZIDE     TAKE these medications   acetaminophen 325 MG tablet Commonly known as:  TYLENOL Take 650 mg by mouth every 6 (six) hours as needed.   amiodarone 200 MG tablet Commonly known as:  PACERONE Take 1 tablet (200 mg total) by mouth 2 (two) times daily.   amiodarone 200 MG tablet Commonly known as:  PACERONE Take 1 tablet (200 mg total) by mouth daily. Start this after finishing two weeks of twice daily dose of amiodarone   apixaban 5 MG Tabs tablet Commonly known as:  ELIQUIS Take 1 tablet (5 mg total) by mouth 2 (two) times daily.   Cholecalciferol 1000 units capsule Take 1,000 Units by mouth daily.   ibuprofen 200 MG tablet Commonly known as:  ADVIL,MOTRIN Take 400 mg by mouth every 6 (six) hours as needed.   metoprolol succinate 50 MG 24 hr tablet Commonly known as:  TOPROL-XL Take 1 tablet (50 mg total) by mouth 2 (two) times daily. Take with or immediately following a meal.   ramipril 5 MG capsule Commonly known as:  ALTACE Take 1 capsule (5 mg total) by mouth daily.   tamsulosin 0.4 MG Caps capsule Commonly known as:  FLOMAX Take 1 capsule (0.4 mg total) by mouth daily. Start taking on:  10/29/2016       Follow-up Information    Rexene Edison, NP Follow up on 11/05/2016.   Specialty:  Pulmonary Disease Why:  check- in at 330pm, then go for Chest xray and then see Tammy at 4pm  Contact information: [address] [place] [address] [phone]        Alex [name], MD. Schedule an appointment as soon as possible for a visit in 2 week(s).   Specialty:  Internal Medicine Contact information: [place] [address] [phone]        [name], MD Follow up.   Specialty:  Cardiology Why:  Follow up as scheduled Contact information: [address] [place] [address] [phone]          Allergies  Allergen Reactions  . Xarelto [Rivaroxaban] Other (See Comments)    INCREASED BP-HYPERTENSIVE EVENTS  . Benazepril Cough    Consultations:  Cardiology  Pulmonary  Procedures/Studies: Dg Chest 2 View  Result Date: 10/28/2016 CLINICAL DATA:  Pleural effusion. EXAM: CHEST  2 VIEW COMPARISON:  10/26/2016. FINDINGS: Left upper lobe infiltrate is again noted. Slight improvement from prior exam. Small left pleural effusion noted, unchanged. Low lung volumes with basilar atelectasis. Stable cardiomegaly stable lower thoracic vertebral body compression fracture. IMPRESSION:  1. Persistent left upper lobe infiltrate, slight clearing from prior exam. Persistent small left pleural effusion unchanged. 2. Low lung volumes with basilar atelectasis. 3. Stable cardiomegaly. Electronically Signed   By: Marcello Moores  Register   On: 10/28/2016 10:00   Dg Chest 2 View  Result Date: 10/22/2016 CLINICAL DATA:  Pneumonia. EXAM: CHEST  2 VIEW COMPARISON:  Radiographs of October 18, 2016. CT scan of October 30, 2016. FINDINGS: Stable cardiomediastinal silhouette. No pneumothorax is noted. Increased mild right basilar opacity is noted concerning for atelectasis or infiltrate. Stable left upper lobe opacity is noted consistent with pneumonia based on prior CT  scan. Increased left basilar opacity is noted concerning for worsening atelectasis or infiltrate with associated pleural effusion. Bony thorax is unremarkable. IMPRESSION: Stable left upper lobe opacity is noted consistent with pneumonia based on prior CT scan. Mildly increased right basilar opacity is noted concerning for atelectasis or infiltrate. Increased left basilar opacity is noted as described above. Electronically Signed   By: Marijo Conception, M.D.   On: 10/22/2016 09:48   Dg Chest 2 View  Result Date: 10/18/2016 CLINICAL DATA:  Productive cough, fever. EXAM: CHEST  2 VIEW COMPARISON:  Radiograph of August 02, 2016. FINDINGS: Cardiac silhouette is unchanged. However, there is interval development of large rounded soft tissue abnormality seen in the left perihilar and suprahilar regions. This may simply represent pneumonia, but possible mass or aneurysm cannot be excluded. No pneumothorax or pleural effusion is noted. Right lung is clear. Bony thorax is unremarkable. IMPRESSION: Interval development of large rounded soft tissue density seen in left perihilar and suprahilar regions ; this may simply represent pneumonia, but possible mass or aneurysm cannot be excluded. CT scan of the chest with intravenous contrast is recommended for further evaluation. Electronically Signed   By: Marijo Conception, M.D.   On: 10/18/2016 10:10   Ct Chest W Contrast  Result Date: 10/20/2016 CLINICAL DATA:  Hemoptysis, severe cough. Abnormal chest radiograph. EXAM: CT CHEST WITH CONTRAST TECHNIQUE: Multidetector CT imaging of the chest was performed during intravenous contrast administration. CONTRAST:  75 ISOVUE-300 IOPAMIDOL (ISOVUE-300) INJECTION 61% COMPARISON:  Chest radiograph 10/18/2016 and 08/02/2016. FINDINGS: Cardiovascular: Atherosclerotic calcification of the arterial vasculature, including coronary arteries and aortic valve left. Heart is mildly enlarged. No pericardial effusion. Mediastinum/Nodes:  Subcentimeter low-attenuation lesion in the left left lobe of the thyroid. Low internal jugular lymph nodes are subcentimeter in short axis size. Mediastinal lymph nodes are not enlarged by CT size criteria. Left hilar lymph nodes measure up to 1.4 cm. No axillary adenopathy. Esophagus is grossly unremarkable. Small hiatal hernia. Lungs/Pleura: Patchy consolidation and surrounding ground-glass in the left upper lobe. Moderate left pleural effusion with compressive collapse/ consolidation in the left lower lobe. Small right pleural effusion with compressive atelectasis in the right lower lobe which extends posteriorly, simulating pleural thickening. Mild septal thickening at the lung bases. Image quality is degraded by respiratory motion. 3 mm right upper lobe nodule, nonspecific. Airway is grossly unremarkable. Upper Abdomen: Visualized portions of the liver, gallbladder, adrenal glands, kidneys, spleen, pancreas, stomach and bowel are grossly unremarkable with exception of a small hiatal hernia. No upper abdominal adenopathy. Musculoskeletal: A rounded lucent lesion is seen in the right glenoid. A similar but smaller lesion is seen in the left glenoid, favoring benign lesions. Advanced degenerative disc disease at T12-L1. IMPRESSION: 1. Consolidation and surrounding ground-glass in the left upper lobe is likely due to pneumonia. Followup PA and lateral chest X-ray is recommended in 3-4 weeks  following trial of antibiotic therapy to ensure resolution and exclude underlying malignancy. 2. Moderate left pleural effusion with collapse/consolidation in the left lower lobe. 3. Small right pleural effusion with compressive atelectasis in the right lower lobe. 4. Suspect mild edema, raising concern for superimposed congestive heart failure. 5. Aortic atherosclerosis (ICD10-170.0). Coronary artery and aortic valvular calcification. Electronically Signed   By: Lorin Picket M.D.   On: 10/20/2016 16:02   Dg Chest  Bilateral Decubitus  Result Date: 10/22/2016 CLINICAL DATA:  Pleural effusion. EXAM: CHEST - BILATERAL DECUBITUS VIEW COMPARISON:  Radiographs of October 18, 2016. FINDINGS: Moderate free flowing left pleural effusion is noted. Mild free flowing right pleural effusion is noted. IMPRESSION: Free-flowing bilateral pleural effusions as described above. Electronically Signed   By: Marijo Conception, M.D.   On: 10/22/2016 09:45   Dg Chest Port 1 View  Result Date: 10/28/2016 CLINICAL DATA:  Patient status post thoracentesis. EXAM: PORTABLE CHEST 1 VIEW COMPARISON:  Chest radiograph 10/28/2016. FINDINGS: Stable enlarged cardiac and mediastinal contours. Stable patchy consolidation left upper lung. Slight interval improvement in consolidation within the left lower lung. Interval decrease in size of now trace left pleural effusion. IMPRESSION: Interval decrease in now trace left pleural effusion with improving opacities left lung base. Persistent opacities left upper lung. Electronically Signed   By: Lovey Newcomer M.D.   On: 10/28/2016 11:33   Dg Chest Port 1 View  Result Date: 10/26/2016 CLINICAL DATA:  Pneumonia . EXAM: PORTABLE CHEST 1 VIEW COMPARISON:  08/23/2017. FINDINGS: Mediastinum and heart size stable. Persistent left upper lobe infiltrate Persistent left lower lobe atelectasis and infiltrate. Small bilateral pleural effusions. IMPRESSION: 1. Persistent left upper lobe infiltrate. Persistent left lower lobe atelectasis and infiltrate. 2. Small bilateral pleural effusions. Electronically Signed   By: Marcello Moores  Register   On: 10/26/2016 06:58   Dg Chest Port 1 View  Result Date: 10/24/2016 CLINICAL DATA:  Pneumonia, cough, shortness of breath EXAM: PORTABLE CHEST 1 VIEW COMPARISON:  CT chest dated 10/20/2016 FINDINGS: Left upper lobe opacity/pneumonia. Mild patchy left lower lobe opacity, atelectasis versus pneumonia. Small bilateral pleural effusions. No pneumothorax. Cardiomegaly. IMPRESSION: Left upper  lobe pneumonia. Left lower lobe opacity, atelectasis versus pneumonia. Small bilateral pleural effusions. Electronically Signed   By: Julian Hy M.D.   On: 10/24/2016 10:40   Dg Chest Port 1 View  Result Date: 10/22/2016 CLINICAL DATA:  Status post thoracentesis. EXAM: PORTABLE CHEST 1 VIEW COMPARISON:  October 22, 2016 FINDINGS: There is infiltrate in the left upper lobe which is stable. Opacity in the right base is slightly increased. No other changes. No pneumothorax. IMPRESSION: Stable left upper lobe infiltrate. Slight increase in right basilar opacity. Electronically Signed   By: Dorise Bullion III M.D   On: 10/22/2016 11:24    Subjective: Eager to go home  Discharge Exam: Vitals:   10/28/16 0553 10/28/16 1100  BP: 132/76 (!) 146/80  Pulse: 66 76  Resp: 19   Temp: 98.1 F (36.7 C)    Vitals:   10/27/16 1429 10/27/16 2035 10/28/16 0553 10/28/16 1100  BP: (!) 144/68 128/69 132/76 (!) 146/80  Pulse: 69 70 66 76  Resp: 15 16 19    Temp: 97.5 F (36.4 C) 97.7 F (36.5 C) 98.1 F (36.7 C)   TempSrc: Oral Oral Oral   SpO2: 95% 92% 93%   Weight:      Height:        General: Pt is alert, awake, not in acute distress Cardiovascular: RRR, S1/S2 +,  no rubs, no gallops Respiratory: CTA bilaterally, no wheezing, no rhonchi Abdominal: Soft, NT, ND, bowel sounds + Extremities: no edema, no cyanosis   The results of significant diagnostics from this hospitalization (including imaging, microbiology, ancillary and laboratory) are listed below for reference.     Microbiology: Recent Results (from the past 240 hour(s))  MRSA PCR Screening     Status: None   Collection Time: 10/18/16  3:38 PM  Result Value Ref Range Status   MRSA by PCR NEGATIVE NEGATIVE Final    Comment:        The GeneXpert MRSA Assay (FDA approved for NASAL specimens only), is one component of a comprehensive MRSA colonization surveillance program. It is not intended to diagnose MRSA infection nor to  guide or monitor treatment for MRSA infections.   Culture, blood (Routine X 2) w Reflex to ID Panel     Status: None   Collection Time: 10/18/16  6:29 PM  Result Value Ref Range Status   Specimen Description BLOOD BLOOD RIGHT FOREARM  Final   Special Requests IN PEDIATRIC BOTTLE 4 CC  Final   Culture   Final    NO GROWTH 5 DAYS Performed at [facility], [place] [address], [place], [place] [address]    Report Status 10/23/2016 FINAL  Final  Culture, blood (Routine X 2) w Reflex to ID Panel     Status: None   Collection Time: 10/18/16  6:29 PM  Result Value Ref Range Status   Specimen Description BLOOD LEFT ANTECUBITAL  Final   Special Requests BOTTLES DRAWN AEROBIC ONLY 5 CC  Final   Culture   Final    NO GROWTH 5 DAYS Performed at [facility], [place] [address], [place], [place] [address]    Report Status 10/23/2016 FINAL  Final  Body fluid culture     Status: None   Collection Time: 10/22/16 11:25 AM  Result Value Ref Range Status   Specimen Description PLEURAL LEFT  Final   Special Requests NONE  Final   Gram Stain   Final    RARE WBC PRESENT, PREDOMINANTLY MONONUCLEAR NO ORGANISMS SEEN    Culture   Final    NO GROWTH 3 DAYS Performed at [facility], [place] [address] [place], [place] [address]    Report Status 10/26/2016 FINAL  Final     Labs: BNP (last 3 results)  Recent Labs  08/02/16 1121 10/25/16 1559  BNP 372.4* 123XX123*   Basic Metabolic Panel:  Recent Labs Lab 10/25/16 0329 10/26/16 0505 10/27/16 0540 10/28/16 0524 10/28/16 1211  NA 139 140 139 141 140  K 3.4* 3.5 3.5 3.7 3.8  CL 105 105 104 105 103  CO2 27 28 30 30 31   GLUCOSE 117* 113* 107* 109* 115*  BUN 11 13 16 19 20   CREATININE 0.80 0.83 0.74 0.82 0.89  CALCIUM 8.0* 8.1* 8.3* 8.8* 8.6*  MG 1.4* 1.7 1.7  --   --    Liver Function Tests:  Recent Labs Lab 10/22/16 1117 10/25/16 0329 10/28/16 1211  AST  --  46* 58*  ALT  --  70* 66*  ALKPHOS  --  55 70  BILITOT  --   0.9 0.6  PROT 6.7 6.5 6.6  ALBUMIN  --  2.4* 2.7*   No results for input(s): LIPASE, AMYLASE in the last 168 hours. No results for input(s): AMMONIA in the last 168 hours. CBC:  Recent Labs Lab 10/22/16 0525 10/24/16 0433 10/27/16  0540 10/28/16 0524  WBC 6.6 5.8 7.7 7.2  HGB 10.9* 11.7* 12.1* 11.7*  HCT 32.4* 34.6* 36.3* 35.0*  MCV 93.1 92.3 92.8 93.3  PLT 279 387 554* 553*   Cardiac Enzymes: No results for input(s): CKTOTAL, CKMB, CKMBINDEX, TROPONINI in the last 168 hours. BNP: Invalid input(s): POCBNP CBG: No results for input(s): GLUCAP in the last 168 hours. D-Dimer No results for input(s): DDIMER in the last 72 hours. Hgb A1c No results for input(s): HGBA1C in the last 72 hours. Lipid Profile No results for input(s): CHOL, HDL, LDLCALC, TRIG, CHOLHDL, LDLDIRECT in the last 72 hours. Thyroid function studies No results for input(s): TSH, T4TOTAL, T3FREE, THYROIDAB in the last 72 hours.  Invalid input(s): FREET3 Anemia work up No results for input(s): VITAMINB12, FOLATE, FERRITIN, TIBC, IRON, RETICCTPCT in the last 72 hours. Urinalysis    Component Value Date/Time   COLORURINE YELLOW 10/21/2016 2355   APPEARANCEUR HAZY (A) 10/21/2016 2355   LABSPEC 1.017 10/21/2016 2355   PHURINE 5.0 10/21/2016 2355   GLUCOSEU NEGATIVE 10/21/2016 2355   GLUCOSEU NEGATIVE 11/19/2015 1352   HGBUR SMALL (A) 10/21/2016 2355   BILIRUBINUR NEGATIVE 10/21/2016 2355   KETONESUR 5 (A) 10/21/2016 2355   PROTEINUR NEGATIVE 10/21/2016 2355   UROBILINOGEN 0.2 11/19/2015 1352   NITRITE NEGATIVE 10/21/2016 2355   LEUKOCYTESUR NEGATIVE 10/21/2016 2355   Sepsis Labs Invalid input(s): PROCALCITONIN,  WBC,  LACTICIDVEN Microbiology Recent Results (from the past 240 hour(s))  MRSA PCR Screening     Status: None   Collection Time: 10/18/16  3:38 PM  Result Value Ref Range Status   MRSA by PCR NEGATIVE NEGATIVE Final    Comment:        The GeneXpert MRSA Assay (FDA approved for NASAL  specimens only), is one component of a comprehensive MRSA colonization surveillance program. It is not intended to diagnose MRSA infection nor to guide or monitor treatment for MRSA infections.   Culture, blood (Routine X 2) w Reflex to ID Panel     Status: None   Collection Time: 10/18/16  6:29 PM  Result Value Ref Range Status   Specimen Description BLOOD BLOOD RIGHT FOREARM  Final   Special Requests IN PEDIATRIC BOTTLE 4 CC  Final   Culture   Final    NO GROWTH 5 DAYS Performed at [facility], [place] [address] [place], [place] [address]    Report Status 10/23/2016 FINAL  Final  Culture, blood (Routine X 2) w Reflex to ID Panel     Status: None   Collection Time: 10/18/16  6:29 PM  Result Value Ref Range Status   Specimen Description BLOOD LEFT ANTECUBITAL  Final   Special Requests BOTTLES DRAWN AEROBIC ONLY 5 CC  Final   Culture   Final    NO GROWTH 5 DAYS Performed at [facility], [place] [address], [place], [place] [address]    Report Status 10/23/2016 FINAL  Final  Body fluid culture     Status: None   Collection Time: 10/22/16 11:25 AM  Result Value Ref Range Status   Specimen Description PLEURAL LEFT  Final   Special Requests NONE  Final   Gram Stain   Final    RARE WBC PRESENT, PREDOMINANTLY MONONUCLEAR NO ORGANISMS SEEN    Culture   Final    NO GROWTH 3 DAYS Performed at [facility], [place] [address], [place], [place] [address]    Report Status 10/26/2016 FINAL  Final  SIGNED:   Donne Hazel, MD  [facility] 10/28/2016, 2:05 PM  If 7PM-7AM, please contact night-coverage [email or web address] Password [email or web address]

## 2016-10-28 NOTE — Care Management Note (Signed)
Case Management Note  Patient Details  Name: Jonell Trickett MRN: [id number] Date of Birth: Jun 02, 1941  Subjective/Objective:Noted PT recc SNF patient declined. Spoke to spouse/patient on phone-they declined HHPT-Nsg notified.                    Action/Plan:d/c home.   Expected Discharge Date:  10/28/16               Expected Discharge Plan:  [place]  In-House Referral:     Discharge planning Services  CM Consult  Post Acute Care Choice:  Home Health Choice offered to:  Patient  DME Arranged:  N/A DME Agency:     HH Arranged:  Patient Refused [place] Agency:     Status of Service:  Completed, signed off  If discussed at [employer] of Stay Meetings, dates discussed:    Additional Comments:  Dessa Phi, RN 10/28/2016, 4:18 PM

## 2016-10-28 NOTE — Progress Notes (Signed)
The patient has been seen in conjunction with Ricke Hey. All aspects of care have been considered and discussed. The patient has been personally interviewed, examined, and all clinical data has been reviewed.   Contents of the note as dictated below are accurate. Now that he is back in sinus rhythm we will continue amiodarone at 200 mg twice a day for total of 2 weeks and then decrease to 200 mg daily. Diltiazem will be discontinued and metoprolol decreased back to the preadmission dose of 50 mg twice a day (he was apparently on metoprolol succinate taken twice daily)  Cardiology follow-up. His primary cardiologist is Dr. [name].   Progress Note  Patient Name: [name] [name] Date of Encounter: 10/28/2016  Primary Cardiologist: Dr Aundra Dubin 08/27/2016  Subjective   Actually feels much better today, thinks his breathing has improved.   Inpatient Medications    Scheduled Meds: . amiodarone  200 mg Oral BID  . benzonatate  200 mg Oral TID  . cefTRIAXone (ROCEPHIN)  IV  1 g Intravenous Q24H  . chlorpheniramine-HYDROcodone  5 mL Oral Q8H  . cholecalciferol  1,000 Units Oral Daily  . dextromethorphan  60 mg Oral BID  . diclofenac sodium  2 g Topical QID  . diltiazem  120 mg Oral Daily  . diltiazem  240 mg Oral Daily  . feeding supplement  1 Container Oral TID BM  . feeding supplement (ENSURE ENLIVE)  237 mL Oral Q24H  . fluticasone  2 spray Each Nare Daily  . mouth rinse  15 mL Mouth Rinse BID  . metoprolol succinate  75 mg Oral BID  . multivitamins with iron  1 tablet Oral Daily  . oxymetazoline  1 spray Each Nare BID  . sodium chloride  250 mL Intravenous Once  . tamsulosin  0.4 mg Oral Daily   Continuous Infusions:  PRN Meds: acetaminophen **OR** acetaminophen, albuterol, bisacodyl, HYDROmorphone (DILAUDID) injection, menthol-cetylpyridinium, polyethylene glycol, senna-docusate   Vital Signs    Vitals:   10/27/16 0549 10/27/16 1429 10/27/16 2035 10/28/16  0553  BP: 134/90 (!) 144/68 128/69 132/76  Pulse: 70 69 70 66  Resp: 20 15 16 19   Temp: 98.2 F (36.8 C) 97.5 F (36.4 C) 97.7 F (36.5 C) 98.1 F (36.7 C)  TempSrc: Oral Oral Oral Oral  SpO2: 95% 95% 92% 93%  Weight:      Height:        Intake/Output Summary (Last 24 hours) at 10/28/16 0949 Last data filed at 10/28/16 0900  Gross per 24 hour  Intake            496.5 ml  Output              925 ml  Net           -428.5 ml   Filed Weights   10/18/16 0859  Weight: 190 lb (86.2 kg)    Telemetry    SR Rates 70s- Personally Reviewed  ECG    n/a - Personally Reviewed  Physical Exam   General: Well developed, well nourished, male NAD.  Head: Normocephalic, atraumatic. Blood around both nares Neck: Supple without bruits, no JVD. Lungs:  Resp regular and unlabored, decreased BS base L>R. Heart: RRR, S1, S2, no S3, S4, 2/6 murmur; no rub. Abdomen: Soft, non-tender, non-distended with normoactive bowel sounds. No hepatomegaly. No rebound/guarding. No obvious abdominal masses. Extremities: No clubbing, cyanosis, 1+ bilateral LE edema. Distal pedal pulses are 2+ bilaterally. Neuro: Alert and oriented X 3. Moves  all extremities spontaneously. Psych: Normal affect.  Labs    Chemistry Recent Labs Lab 10/22/16 1117  10/25/16 0329 10/26/16 0505 10/27/16 0540 10/28/16 0524  NA  --   < > 139 140 139 141  K  --   < > 3.4* 3.5 3.5 3.7  CL  --   < > 105 105 104 105  CO2  --   < > 27 28 30 30   GLUCOSE  --   < > 117* 113* 107* 109*  BUN  --   < > 11 13 16 19   CREATININE  --   < > 0.80 0.83 0.74 0.82  CALCIUM  --   < > 8.0* 8.1* 8.3* 8.8*  PROT 6.7  --  6.5  --   --   --   ALBUMIN  --   --  2.4*  --   --   --   AST  --   --  46*  --   --   --   ALT  --   --  70*  --   --   --   ALKPHOS  --   --  55  --   --   --   BILITOT  --   --  0.9  --   --   --   GFRNONAA  --   < > >60 >60 >60 >60  GFRAA  --   < > >60 >60 >60 >60  ANIONGAP  --   < > 7 7 5 6   < > = values in this  interval not displayed.  Magnesium  Date Value Ref Range Status  10/27/2016 1.7 1.7 - 2.4 mg/dL Final   Hematology  Recent Labs Lab 10/24/16 0433 10/27/16 0540 10/28/16 0524  WBC 5.8 7.7 7.2  RBC 3.75* 3.91* 3.75*  HGB 11.7* 12.1* 11.7*  HCT 34.6* 36.3* 35.0*  MCV 92.3 92.8 93.3  MCH 31.2 30.9 31.2  MCHC 33.8 33.3 33.4  RDW 13.3 13.6 13.6  PLT 387 554* 553*    Cardiac EnzymesNo results for input(s): TROPONINI in the last 168 hours.   Radiology    No results found.  Cardiac Studies   ECHO: 10/21/2016 - Left ventricle: The cavity size was normal. There was mild   concentric hypertrophy. Systolic function was normal. The   estimated ejection fraction was in the range of 55% to 60%. Wall   motion was normal; there were no regional wall motion   abnormalities. - Aortic valve: Functionally bicuspid (fused right and left cusps);   moderately thickened, mildly calcified leaflets. There was   moderate stenosis by 2D imaging. The recorded Doppler velocities   are probably an underestimation of true transvalvular gradients.   There was trivial regurgitation. Valve area (VTI): 2.95 cm^2.   Valve area (Vmax): 2.51 cm^2. Valve area (Vmean): 2.39 cm^2. - Mitral valve: There was mild regurgitation directed centrally. - Left atrium: The atrium was moderately dilated. - Atrial septum: No defect or patent foramen ovale was identified. Impressions: - 2D imaging suggests moderate aortic stenosis. Transaortic   gradients were much higher on the study from April 2017. Suspect   aortic valve gradients are underestimated on the current study   due to poor beam alignment. Recommendations:  Consider TEE if necessary to better evaluate the aortic valve.   Patient Profile     76 y.o. male with a history of bicuspid AoV, mod AS, PAF on Eliquis, HTN, HLD, 4.4 cm AAA was admitted 02/05  with fever, hyotension, ?PNA. Pt went into rapid afib 02/10>>cards consult. Converted to [facility]  2/14  Assessment & Plan    1.  PAF (paroxysmal atrial fibrillation): The patient reverted to normal sinus rhythm greater than 24 hours ago. Plan will be to discontinue diltiazem. Continue metoprolol at preadmission dose of 50 mg mg twice a day.. Continue amiodarone 200 mg twice a day for total of 2 weeks then decrease to 200 mg daily. He will have a cardiology follow-up within the next 10-14 days for EKG and further medication adjustment if needed. Agree with resumption of chronic anticoagulation therapy, Apixiban.  2. Aortic stenosis, bicuspid valve - S2 heard clearly  3.  CAP  - s/p thoracentesis w/ 1 L parapneumonic fluid removed 10/22/16. Eliquis held with plans for possible thora today.  - per IM/CCM  4. Diastolic HF: Breathing seems to have improved. Did diuresis some with IV lasix, but appears volume stable.    Otherwise, per IM Active Problems:   Essential hypertension   Aortic valve disorder   Occlusion and stenosis of carotid artery   Chest pain   Dyspnea   Ascending aortic aneurysm (HCC)   Bicuspid aortic valve   Sepsis ([place])   AKI (acute kidney injury) ([place])   Parapneumonic effusion   Hemoptysis  Signed, Reino Bellis , NP 9:49 AM 10/28/2016

## 2016-10-28 NOTE — Progress Notes (Signed)
Patient spouse request to schedule post discharge visiit d/t current work schedule

## 2016-10-28 NOTE — Progress Notes (Signed)
PT Cancellation Note  Patient Details Name: [name] [name] MRN: [id number] DOB: March 11, 1941   Cancelled Treatment:    Reason Eval/Treat Not Completed: Other (comment) Pt declines ambulating today.  Spouse arrived.  She states she ambulated with him yesterday and will today as well.  Per RN possible d/c today.  Pt declines SNF at this time.  Spouse states she feels comfortable managing him at home, and she will be off work for the weekend.  Pt ambulated to bathroom while therapist in room and mobilizing well with RW.  HR 79 bpm upon leaving room.  Spouse declines RW for home, stating she believes he will return to baseline soon.   Khelani Kops,KATHrine E 10/28/2016, 2:17 PM Carmelia Bake, PT, DPT 10/28/2016 Pager: [phone]

## 2016-10-29 ENCOUNTER — Telehealth: Payer: Self-pay | Admitting: Cardiology

## 2016-10-29 LAB — PATHOLOGIST SMEAR REVIEW

## 2016-10-29 NOTE — Telephone Encounter (Signed)
I spoke with pt's wife, Joni Reining. Pt recently hospitalized 10 days for pneumonia, had at fib while hospitalized.

## 2016-10-29 NOTE — Telephone Encounter (Signed)
New Message:   Pt's wife asked for you. Please call,she says pt was just discharged from the hospital yesterday and he was admitted for pneumonia. Pt was in the hospital for 10 days,while there he was in atiral fib and they want him to see Dr Aundra Dubin for a follow up. I told her she need to speak to a scheduler,she said she need to talk to you.[name]

## 2016-10-29 NOTE — Telephone Encounter (Signed)
Pt scheduled to see Nell Range 11/11/16 at 3:30PM, wife requests Thursday afternoon--per Dr Thompson Caul 10/28/16 hospital note pt needs follow up appt 10-14 days for EKG and possible medication adjustment.  Pt's wife advised I  have left follow up appointment already scheduled with Dr Aundra Dubin for 11/25/16 at [facility].

## 2016-10-31 LAB — BODY FLUID CULTURE: Culture: NO GROWTH

## 2016-11-01 ENCOUNTER — Encounter: Payer: Self-pay | Admitting: Physician Assistant

## 2016-11-02 DIAGNOSIS — H906 Mixed conductive and sensorineural hearing loss, bilateral: Secondary | ICD-10-CM | POA: Diagnosis not present

## 2016-11-02 DIAGNOSIS — H6523 Chronic serous otitis media, bilateral: Secondary | ICD-10-CM | POA: Diagnosis not present

## 2016-11-05 ENCOUNTER — Encounter: Payer: Self-pay | Admitting: Adult Health

## 2016-11-05 ENCOUNTER — Ambulatory Visit (INDEPENDENT_AMBULATORY_CARE_PROVIDER_SITE_OTHER): Payer: Medicare Other | Admitting: Adult Health

## 2016-11-05 ENCOUNTER — Ambulatory Visit (INDEPENDENT_AMBULATORY_CARE_PROVIDER_SITE_OTHER)
Admission: RE | Admit: 2016-11-05 | Discharge: 2016-11-05 | Disposition: A | Discharge status: Home / Self Care | Payer: Medicare Other | Source: Ambulatory Visit | Attending: Adult Health | Admitting: Adult Health

## 2016-11-05 VITALS — BP 134/84 | HR 52 | Ht 71.0 in | Wt 185.0 lb

## 2016-11-05 DIAGNOSIS — J181 Lobar pneumonia, unspecified organism: Secondary | ICD-10-CM | POA: Diagnosis not present

## 2016-11-05 DIAGNOSIS — J189 Pneumonia, unspecified organism: Secondary | ICD-10-CM

## 2016-11-05 DIAGNOSIS — J918 Pleural effusion in other conditions classified elsewhere: Secondary | ICD-10-CM

## 2016-11-05 NOTE — Progress Notes (Signed)
@Patient  ID: [name] [name], male    DOB: 1941/01/31, 76 y.o.   MRN: [id number]  Chief Complaint  Patient presents with  . Follow-up    post hospital     Referring provider: Cassandria Anger, MD  HPI: 76 year old male never smoker seen for pulmonary consult 10/21/2016 during hospitalization for pleural effusion and PNA.   11/05/2016 [facility] follow up  Patient returns for a one-week follow-up from recent hospitalization Patient was recently admitted for a left streptococcal pneumonia a community-acquired pneumonia with sepsis and a left parapneumonic pleural effusion. Seen by pulmonary during his hospitalization. He underwent a left-sided thoracentesis x 2 . C/w parapnumonic process. Cytology was positive for acute inflammation /abscess/empyema. He was treated with aggressive antibiotics for a total 14 days of therapy. Since discharge. Patient is feeling some better, gets winded with walking.  Chest x-ray shows improved left upper lobe pneumonia and a residual small left pleural effusion. Appetite is starting to improve. Lost about 15lbs during hospitla stay.  Says he his hearing has been down since discharge . Seen by ENT . Given ear drops.   Allergies  Allergen Reactions  . Xarelto [Rivaroxaban] Other (See Comments)    INCREASED BP-HYPERTENSIVE EVENTS  . Benazepril Cough    Immunization History  Administered Date(s) Administered  . Influenza Whole 07/27/2010, 07/14/2012  . Influenza-Unspecified 06/24/2013, 08/05/2015  . Td 03/04/2015  . Zoster 02/02/2012    Past Medical History:  Diagnosis Date  . Aortic stenosis, mild    Echo 07/11: 55-60%, mild LV hypertrophy, mild aortic stenosis w/mean gradient 70mmHg and peak gradient 36 mmHg.  . Ascending aortic aneurysm ([place])   . Bicuspid aortic valve   . Chest pain    ETT-myoview 12/11 w/exercise, no chest pain, no significant ST changes, EF 69%, no evidence for ischemia or infarction.  . CHF NYHA class I (no symptoms  from ordinary activities), acute, diastolic ([place])   . HTN (hypertension)   . Hypercholesteremia   . LBP (low back pain)   . Moderate aortic stenosis   . Osteoarthritis   . Paroxysmal atrial fibrillation ([place])    a. new onset Afib in 07/2008. He underwent ibutilide cardioversion successfully. b. Recurrence 01/2013 s/p TEE/DCCV - was on Xarelto but he stopped it as he was convinced it was causing joint pn. c. Recurrence 01/2016 - spont conv to NSR. Pt took Eliquis x1 mo then declined further anticoag. d. Recurrence 07/2016.    Tobacco History: History  Smoking Status  . Never Smoker  Smokeless Tobacco  . Never Used   Counseling given: Not Answered   Outpatient Encounter Prescriptions as of 11/05/2016  Medication Sig  . apixaban (ELIQUIS) 5 MG TABS tablet Take 1 tablet (5 mg total) by mouth 2 (two) times daily.  . Cholecalciferol 1000 UNITS capsule Take 1,000 Units by mouth daily.  [name] ibuprofen (ADVIL,MOTRIN) 200 MG tablet Take 400 mg by mouth every 6 (six) hours as needed.  . metoprolol succinate (TOPROL-XL) 50 MG 24 hr tablet Take 1 tablet (50 mg total) by mouth 2 (two) times daily. Take with or immediately following a meal.  . amiodarone (PACERONE) 200 MG tablet Take 1 tablet (200 mg total) by mouth 2 (two) times daily. (Patient not taking: Reported on 11/05/2016)  . amiodarone (PACERONE) 200 MG tablet Take 1 tablet (200 mg total) by mouth daily. Start this after finishing two weeks of twice daily dose of amiodarone (Patient not taking: Reported on 11/05/2016)  . ramipril (ALTACE) 5 MG capsule Take  1 capsule (5 mg total) by mouth daily. (Patient not taking: Reported on 11/05/2016)  . tamsulosin (FLOMAX) 0.4 MG CAPS capsule Take 1 capsule (0.4 mg total) by mouth daily. (Patient not taking: Reported on 11/05/2016)  . [DISCONTINUED] acetaminophen (TYLENOL) 325 MG tablet Take 650 mg by mouth every 6 (six) hours as needed.   No facility-administered encounter medications on file as of 11/05/2016.        Review of Systems  Constitutional:   No  weight loss, night sweats,  Fevers, chills, fatigue, or  lassitude.  HEENT:   No headaches,  Difficulty swallowing,  Tooth/dental problems, or  Sore throat,                No sneezing, itching, ear ache, nasal congestion, post nasal drip,   CV:  No chest pain,  Orthopnea, PND, swelling in lower extremities, anasarca, dizziness, palpitations, syncope.   GI  No heartburn, indigestion, abdominal pain, nausea, vomiting, diarrhea, change in bowel habits, loss of appetite, bloody stools.   Resp: No shortness of breath with exertion or at rest.  No excess mucus, no productive cough,  No non-productive cough,  No coughing up of blood.  No change in color of mucus.  No wheezing.  No chest wall deformity  Skin: no rash or lesions.  GU: no dysuria, change in color of urine, no urgency or frequency.  No flank pain, no hematuria   MS:  No joint pain or swelling.  No decreased range of motion.  No back pain.    Physical Exam  BP 134/84 (BP Location: Left Arm, Cuff Size: Normal)   Pulse (!) 52   Ht 5\' 11"  (1.803 m)   Wt 185 lb (83.9 kg)   SpO2 98%   BMI 25.80 kg/m   GEN: A/Ox3; pleasant , NAD, well nourished    HEENT:  [place]/AT,  EACs-clear, TMs-wnl, NOSE-clear, THROAT-clear, no lesions, no postnasal drip or exudate noted.   NECK:  Supple w/ fair ROM; no JVD; normal carotid impulses w/o bruits; no thyromegaly or nodules palpated; no lymphadenopathy.    RESP  Clear  P & A; w/o, wheezes/ rales/ or rhonchi. no accessory muscle use, no dullness to percussion  CARD:  RRR, 2.6 SM, tr  peripheral edema, pulses intact, no cyanosis or clubbing.  GI:   Soft & nt; nml bowel sounds; no organomegaly or masses detected.   Musco: Warm bil, no deformities or joint swelling noted.   Neuro: alert, no focal deficits noted.    Skin: Warm, no lesions or rashes    Lab Results:  CBC    Component Value Date/Time   WBC 7.2 10/28/2016 0524   RBC 3.75 (L)  10/28/2016 0524   HGB 11.7 (L) 10/28/2016 0524   HCT 35.0 (L) 10/28/2016 0524   PLT 553 (H) 10/28/2016 0524   MCV 93.3 10/28/2016 0524   MCH 31.2 10/28/2016 0524   MCHC 33.4 10/28/2016 0524   RDW 13.6 10/28/2016 0524   LYMPHSABS 0.6 (L) 10/18/2016 1000   MONOABS 0.1 10/18/2016 1000   EOSABS 0.0 10/18/2016 1000   BASOSABS 0.0 10/18/2016 1000    BMET    Component Value Date/Time   NA 140 10/28/2016 1211   K 3.8 10/28/2016 1211   CL 103 10/28/2016 1211   CO2 31 10/28/2016 1211   GLUCOSE 115 (H) 10/28/2016 1211   BUN 20 10/28/2016 1211   CREATININE 0.89 10/28/2016 1211   CREATININE 0.81 09/10/2016 1101   CALCIUM 8.6 (L) 10/28/2016 1211  GFRNONAA >60 10/28/2016 1211   GFRAA >60 10/28/2016 1211    BNP    Component Value Date/Time   BNP 351.8 (H) 10/25/2016 1559    ProBNP    Component Value Date/Time   PROBNP 109.0 (H) 02/09/2013 0906    Imaging: Dg Chest 2 View  Result Date: 11/05/2016 CLINICAL DATA:  Follow-up pneumonia EXAM: CHEST  2 VIEW COMPARISON:  10/28/2016 FINDINGS: Patchy left upper lobe opacity, improving, compatible with improving pneumonia. Mild left basilar opacity, likely atelectasis. Mild right basilar scarring/ atelectasis. Suspected small pleural effusion. The heart is top-normal in size. Mild degenerative changes of the visualized thoracolumbar spine. IMPRESSION: Improving left upper lobe pneumonia. Suspected small left pleural effusion. Electronically Signed   By: Julian Hy M.D.   On: 11/05/2016 15:59   Dg Chest 2 View  Result Date: 10/28/2016 CLINICAL DATA:  Pleural effusion. EXAM: CHEST  2 VIEW COMPARISON:  10/26/2016. FINDINGS: Left upper lobe infiltrate is again noted. Slight improvement from prior exam. Small left pleural effusion noted, unchanged. Low lung volumes with basilar atelectasis. Stable cardiomegaly stable lower thoracic vertebral body compression fracture. IMPRESSION: 1. Persistent left upper lobe infiltrate, slight clearing from  prior exam. Persistent small left pleural effusion unchanged. 2. Low lung volumes with basilar atelectasis. 3. Stable cardiomegaly. Electronically Signed   By: Marcello Moores  Register   On: 10/28/2016 10:00   Dg Chest 2 View  Result Date: 10/22/2016 CLINICAL DATA:  Pneumonia. EXAM: CHEST  2 VIEW COMPARISON:  Radiographs of October 18, 2016. CT scan of October 30, 2016. FINDINGS: Stable cardiomediastinal silhouette. No pneumothorax is noted. Increased mild right basilar opacity is noted concerning for atelectasis or infiltrate. Stable left upper lobe opacity is noted consistent with pneumonia based on prior CT scan. Increased left basilar opacity is noted concerning for worsening atelectasis or infiltrate with associated pleural effusion. Bony thorax is unremarkable. IMPRESSION: Stable left upper lobe opacity is noted consistent with pneumonia based on prior CT scan. Mildly increased right basilar opacity is noted concerning for atelectasis or infiltrate. Increased left basilar opacity is noted as described above. Electronically Signed   By: Marijo Conception, M.D.   On: 10/22/2016 09:48   Dg Chest 2 View  Result Date: 10/18/2016 CLINICAL DATA:  Productive cough, fever. EXAM: CHEST  2 VIEW COMPARISON:  Radiograph of August 02, 2016. FINDINGS: Cardiac silhouette is unchanged. However, there is interval development of large rounded soft tissue abnormality seen in the left perihilar and suprahilar regions. This may simply represent pneumonia, but possible mass or aneurysm cannot be excluded. No pneumothorax or pleural effusion is noted. Right lung is clear. Bony thorax is unremarkable. IMPRESSION: Interval development of large rounded soft tissue density seen in left perihilar and suprahilar regions ; this may simply represent pneumonia, but possible mass or aneurysm cannot be excluded. CT scan of the chest with intravenous contrast is recommended for further evaluation. Electronically Signed   By: Marijo Conception, M.D.    On: 10/18/2016 10:10   Ct Chest W Contrast  Result Date: 10/20/2016 CLINICAL DATA:  Hemoptysis, severe cough. Abnormal chest radiograph. EXAM: CT CHEST WITH CONTRAST TECHNIQUE: Multidetector CT imaging of the chest was performed during intravenous contrast administration. CONTRAST:  75 ISOVUE-300 IOPAMIDOL (ISOVUE-300) INJECTION 61% COMPARISON:  Chest radiograph 10/18/2016 and 08/02/2016. FINDINGS: Cardiovascular: Atherosclerotic calcification of the arterial vasculature, including coronary arteries and aortic valve left. Heart is mildly enlarged. No pericardial effusion. Mediastinum/Nodes: Subcentimeter low-attenuation lesion in the left left lobe of the thyroid. Low internal jugular  lymph nodes are subcentimeter in short axis size. Mediastinal lymph nodes are not enlarged by CT size criteria. Left hilar lymph nodes measure up to 1.4 cm. No axillary adenopathy. Esophagus is grossly unremarkable. Small hiatal hernia. Lungs/Pleura: Patchy consolidation and surrounding ground-glass in the left upper lobe. Moderate left pleural effusion with compressive collapse/ consolidation in the left lower lobe. Small right pleural effusion with compressive atelectasis in the right lower lobe which extends posteriorly, simulating pleural thickening. Mild septal thickening at the lung bases. Image quality is degraded by respiratory motion. 3 mm right upper lobe nodule, nonspecific. Airway is grossly unremarkable. Upper Abdomen: Visualized portions of the liver, gallbladder, adrenal glands, kidneys, spleen, pancreas, stomach and bowel are grossly unremarkable with exception of a small hiatal hernia. No upper abdominal adenopathy. Musculoskeletal: A rounded lucent lesion is seen in the right glenoid. A similar but smaller lesion is seen in the left glenoid, favoring benign lesions. Advanced degenerative disc disease at T12-L1. IMPRESSION: 1. Consolidation and surrounding ground-glass in the left upper lobe is likely due to  pneumonia. Followup PA and lateral chest X-ray is recommended in 3-4 weeks following trial of antibiotic therapy to ensure resolution and exclude underlying malignancy. 2. Moderate left pleural effusion with collapse/consolidation in the left lower lobe. 3. Small right pleural effusion with compressive atelectasis in the right lower lobe. 4. Suspect mild edema, raising concern for superimposed congestive heart failure. 5. Aortic atherosclerosis (ICD10-170.0). Coronary artery and aortic valvular calcification. Electronically Signed   By: Lorin Picket M.D.   On: 10/20/2016 16:02   Dg Chest Bilateral Decubitus  Result Date: 10/22/2016 CLINICAL DATA:  Pleural effusion. EXAM: CHEST - BILATERAL DECUBITUS VIEW COMPARISON:  Radiographs of October 18, 2016. FINDINGS: Moderate free flowing left pleural effusion is noted. Mild free flowing right pleural effusion is noted. IMPRESSION: Free-flowing bilateral pleural effusions as described above. Electronically Signed   By: Marijo Conception, M.D.   On: 10/22/2016 09:45   Dg Chest Port 1 View  Result Date: 10/28/2016 CLINICAL DATA:  Patient status post thoracentesis. EXAM: PORTABLE CHEST 1 VIEW COMPARISON:  Chest radiograph 10/28/2016. FINDINGS: Stable enlarged cardiac and mediastinal contours. Stable patchy consolidation left upper lung. Slight interval improvement in consolidation within the left lower lung. Interval decrease in size of now trace left pleural effusion. IMPRESSION: Interval decrease in now trace left pleural effusion with improving opacities left lung base. Persistent opacities left upper lung. Electronically Signed   By: Lovey Newcomer M.D.   On: 10/28/2016 11:33   Dg Chest Port 1 View  Result Date: 10/26/2016 CLINICAL DATA:  Pneumonia . EXAM: PORTABLE CHEST 1 VIEW COMPARISON:  08/23/2017. FINDINGS: Mediastinum and heart size stable. Persistent left upper lobe infiltrate Persistent left lower lobe atelectasis and infiltrate. Small bilateral pleural  effusions. IMPRESSION: 1. Persistent left upper lobe infiltrate. Persistent left lower lobe atelectasis and infiltrate. 2. Small bilateral pleural effusions. Electronically Signed   By: Marcello Moores  Register   On: 10/26/2016 06:58   Dg Chest Port 1 View  Result Date: 10/24/2016 CLINICAL DATA:  Pneumonia, cough, shortness of breath EXAM: PORTABLE CHEST 1 VIEW COMPARISON:  CT chest dated 10/20/2016 FINDINGS: Left upper lobe opacity/pneumonia. Mild patchy left lower lobe opacity, atelectasis versus pneumonia. Small bilateral pleural effusions. No pneumothorax. Cardiomegaly. IMPRESSION: Left upper lobe pneumonia. Left lower lobe opacity, atelectasis versus pneumonia. Small bilateral pleural effusions. Electronically Signed   By: Julian Hy M.D.   On: 10/24/2016 10:40   Dg Chest Port 1 View  Result Date: 10/22/2016 CLINICAL  DATA:  Status post thoracentesis. EXAM: PORTABLE CHEST 1 VIEW COMPARISON:  October 22, 2016 FINDINGS: There is infiltrate in the left upper lobe which is stable. Opacity in the right base is slightly increased. No other changes. No pneumothorax. IMPRESSION: Stable left upper lobe infiltrate. Slight increase in right basilar opacity. Electronically Signed   By: Dorise Bullion III M.D   On: 10/22/2016 11:24    Reviewed independenty  Assessment & Plan:   Parapneumonic effusion Much improved after abx and thoracentesis   Plan  Patient Instructions  Continue on current regimen .  Follow up with Cardiology regarding your heart medication .  Advance activity as tolerated.  Follow up with Dr. Elsworth Soho  In 6 weeks with chest xray .  Please contact office for sooner follow up if symptoms do not improve or worsen or seek emergency care      CAP (community acquired pneumonia) Streptococcal PNA w/ parapneumonic effusion , improved after prolonged abx and thoracentesis  cxr is much improved today .  Clinically pt is improving .   Plan  Patient Instructions  Continue on current  regimen .  Follow up with Cardiology regarding your heart medication .  Advance activity as tolerated.  Follow up with Dr. Elsworth Soho  In 6 weeks with chest xray .  Please contact office for sooner follow up if symptoms do not improve or worsen or seek emergency care         Rexene Edison, NP 11/05/2016

## 2016-11-05 NOTE — Assessment & Plan Note (Signed)
Much improved after abx and thoracentesis   Plan  Patient Instructions  Continue on current regimen .  Follow up with Cardiology regarding your heart medication .  Advance activity as tolerated.  Follow up with Dr. Elsworth Soho  In 6 weeks with chest xray .  Please contact office for sooner follow up if symptoms do not improve or worsen or seek emergency care

## 2016-11-05 NOTE — Patient Instructions (Addendum)
Continue on current regimen .  Follow up with Cardiology regarding your heart medication .  Advance activity as tolerated.  Follow up with Dr. Elsworth Soho  In 6 weeks with chest xray .  Please contact office for sooner follow up if symptoms do not improve or worsen or seek emergency care

## 2016-11-05 NOTE — Assessment & Plan Note (Addendum)
Streptococcal PNA w/ parapneumonic effusion , improved after prolonged abx and thoracentesis  cxr is much improved today .  Clinically pt is improving .  Will need cxr on return   Plan  Patient Instructions  Continue on current regimen .  Follow up with Cardiology regarding your heart medication .  Advance activity as tolerated.  Follow up with Dr. Elsworth Soho  In 6 weeks with chest xray .  Please contact office for sooner follow up if symptoms do not improve or worsen or seek emergency care

## 2016-11-11 ENCOUNTER — Encounter: Payer: Self-pay | Admitting: Physician Assistant

## 2016-11-11 ENCOUNTER — Ambulatory Visit (INDEPENDENT_AMBULATORY_CARE_PROVIDER_SITE_OTHER): Payer: Medicare Other | Admitting: Physician Assistant

## 2016-11-11 VITALS — BP 162/86 | HR 55 | Ht 71.0 in | Wt 187.4 lb

## 2016-11-11 DIAGNOSIS — I10 Essential (primary) hypertension: Secondary | ICD-10-CM

## 2016-11-11 DIAGNOSIS — I359 Nonrheumatic aortic valve disorder, unspecified: Secondary | ICD-10-CM | POA: Diagnosis not present

## 2016-11-11 DIAGNOSIS — I712 Thoracic aortic aneurysm, without rupture, unspecified: Secondary | ICD-10-CM

## 2016-11-11 DIAGNOSIS — I480 Paroxysmal atrial fibrillation: Secondary | ICD-10-CM

## 2016-11-11 NOTE — Patient Instructions (Addendum)
Medication Instructions:  Your physician recommends that you continue on your current medications as directed. Please refer to the Current Medication list given to you today.   Labwork: None ordered  Testing/Procedures: None ordered  Follow-Up: Your physician recommends that you schedule a follow-up appointment in: SEE DR. MCLEAN AS PLANNED  Any Other Special Instructions Will Be Listed Below (If Applicable).     If you need a refill on your cardiac medications before your next appointment, please call your pharmacy.

## 2016-11-11 NOTE — Progress Notes (Signed)
Cardiology Office Note    Date:  11/11/2016   ID:  Jacinto Leva, DOB [birth year], MRN [id number]  PCP:  [name], MD  Cardiologist:  Dr. Aundra Dubin  CC: post hospital follow up  History of Present Illness:  [name] [name] is a 76 y.o. male with a history of HTN, PAF, HLD, biscuspid aortic valve w/ ascending aortic aneurysm who presents to clinic for post hospital follow up.   He was hospitalized with atrial fibrillation/RVR in 5/14.  He had TEE-guided cardioversion.  TEE showed bicuspid aortic valve with mild AS and a moderately dilated ascending aorta.  Most recent echo in 4/17 showed moderate aortic stenosis and MRA chest in 11/17 showed 4.1 cm ascending aorta.   He was on Xarelto for anticoagulation but stopped it as he was convinced it was causing joint pains.  He then refused to start any other anticoagulation.    In 5/17, he had been back in atrial fibrillation for several days and was symptomatic.  Dr. Aundra Dubin started him on Eliquis and planned TEE-guided DCCV given significant symptoms, but he converted back to NSR on his own.  He continued Eliquis for about 1 month then stopped it on his own  In 11/17, he was hospitalized with symptomatic atrial fibrillation with RVR.  Dr. Aundra Dubin started him on diltiazem CD and Eliquis with plan for TEE-guided DCCV.  However, he converted back to NSR on his own. He stopped the diltiazem but continued the Eliquis.   He saw Dr. Aundra Dubin last in the office on 08/27/16 and was in normal rhythm. His BP was elevated and he was started on ramipril 5mg  daily.   He was recently admitted 2/5-2/15/18 with fever, hyotension, ?PNA with pleural effusions. He underwent thoracentesis and eliquis was held. Pt went into rapid afib 02/10 and cardiology consulted. He spontaneously converted to SR 2/14. Discharged on amiodarone 200mg  BID x 2 weeks, followed by 200mg  daily and resumed home Torprol XL 50mg  bid per home regimen.  Today he presents to clinic for follow up.  He stopped his amiodarone, ramipril and tamsulosin on his own because he could not eat or sleep. He thinks if he kept taking them " he would be dead by now." It turns out that he never took the Ramipril that Dr. Aundra Dubin started in December. He only takes the Eliquis because Dr. Aundra Dubin insisted but he really doesn't like it. No CP or SOB. No LE edema, orthopnea or PND. No dizziness or syncope. No blood in stool or urine. No palpitations. His hearing is improving a lot which makes him happy. He stared working out again using very [employer].      Past Medical History:  Diagnosis Date  . Aortic stenosis, mild    Echo 07/11: 55-60%, mild LV hypertrophy, mild aortic stenosis w/mean gradient 37mmHg and peak gradient 36 mmHg.  . Ascending aortic aneurysm ([place])   . Bicuspid aortic valve   . Chest pain    ETT-myoview 12/11 w/exercise, no chest pain, no significant ST changes, EF 69%, no evidence for ischemia or infarction.  . CHF NYHA class I (no symptoms from ordinary activities), acute, diastolic ([place])   . HTN (hypertension)   . Hypercholesteremia   . LBP (low back pain)   . Moderate aortic stenosis   . Osteoarthritis   . Paroxysmal atrial fibrillation ([place])    a. new onset Afib in 07/2008. He underwent ibutilide cardioversion successfully. b. Recurrence 01/2013 s/p TEE/DCCV - was on Xarelto but he stopped it as  he was convinced it was causing joint pn. c. Recurrence 01/2016 - spont conv to NSR. Pt took Eliquis x1 mo then declined further anticoag. d. Recurrence 07/2016.    Past Surgical History:  Procedure Laterality Date  . BACK SURGERY  x12 years ago  . CARDIOVERSION N/A 01/26/2013   Procedure: CARDIOVERSION;  Surgeon: Larey Dresser, MD;  Location: [facility];  Service: Cardiovascular;  Laterality: N/A;  . HEMORRHOID SURGERY    . LUMBAR LAMINECTOMY    . TEE WITHOUT CARDIOVERSION N/A 01/26/2013   Procedure: TRANSESOPHAGEAL ECHOCARDIOGRAM (TEE);  Surgeon: Larey Dresser, MD;  Location[facility]  ENDOSCOPY;  Service: Cardiovascular;  Laterality: N/A;    Current Medications: Outpatient Medications Prior to Visit  Medication Sig Dispense Refill  . apixaban (ELIQUIS) 5 MG TABS tablet Take 1 tablet (5 mg total) by mouth 2 (two) times daily. 60 tablet 3  . Cholecalciferol 1000 UNITS capsule Take 1,000 Units by mouth daily.    . metoprolol succinate (TOPROL-XL) 50 MG 24 hr tablet Take 1 tablet (50 mg total) by mouth 2 (two) times daily. Take with or immediately following a meal. 180 tablet 3  . amiodarone (PACERONE) 200 MG tablet Take 1 tablet (200 mg total) by mouth 2 (two) times daily. (Patient not taking: Reported on 11/05/2016) 28 tablet 0  . amiodarone (PACERONE) 200 MG tablet Take 1 tablet (200 mg total) by mouth daily. Start this after finishing two weeks of twice daily dose of amiodarone (Patient not taking: Reported on 11/05/2016) 30 tablet 0  . ibuprofen (ADVIL,MOTRIN) 200 MG tablet Take 400 mg by mouth every 6 (six) hours as needed (pain).     . ramipril (ALTACE) 5 MG capsule Take 1 capsule (5 mg total) by mouth daily. (Patient not taking: Reported on 11/05/2016) 90 capsule 1  . tamsulosin (FLOMAX) 0.4 MG CAPS capsule Take 1 capsule (0.4 mg total) by mouth daily. (Patient not taking: Reported on 11/05/2016) 30 capsule 0   No facility-administered medications prior to visit.      Allergies:   Xarelto [rivaroxaban] and Benazepril   Social History   Social History  . Marital status: Married    Spouse name: N/A  . Number of children: N/A  . Years of education: N/A   Occupational History  . 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 Smoking status: Never Smoker  . Smokeless tobacco: Never Used  . Alcohol use 0.0 oz/week     Comment: Drinks 1 glass of wine nightly.  . Drug use: No  . Sexual activity: Yes   Other Topics Concern  . None   Social History Narrative   Patient lives in [place]. He is a native of [place]. He is an Chief Financial Officer at [employer].  He is a former Microbiologist.     Family History:  The patient's family history includes Colon cancer (age of onset: 66) in his mother; Hypertension in his other.      ROS:   Please see the history of present illness.    ROS All other systems reviewed and are negative.   PHYSICAL EXAM:   VS:  BP (!) 162/86   Pulse (!) 55   Ht 5\' 11"  (1.803 m)   Wt 187 lb 6.4 oz (85 kg)   SpO2 98%   BMI 26.14 kg/m    GEN: Well nourished, well developed, in no acute distress  HEENT: normal  Neck: no JVD, carotid bruits, or masses Cardiac: RRR; +  4/6 SEM, rubs, or gallops,no edema  Respiratory:  clear to auscultation bilaterally, normal work of breathing GI: soft, nontender, nondistended, + BS MS: no deformity or atrophy  Skin: warm and dry, no rash Neuro:  Alert and Oriented x 3, Strength and sensation are intact Psych: euthymic mood, full affect    Wt Readings from Last 3 Encounters:  11/11/16 187 lb 6.4 oz (85 kg)  11/05/16 185 lb (83.9 kg)  10/18/16 190 lb (86.2 kg)      Studies/Labs Reviewed:   EKG:  EKG is ordered today.  The ekg ordered today demonstrates sinus bradycardia HR 55.   Recent Labs: 08/02/2016: TSH 3.387 10/25/2016: B Natriuretic Peptide 351.8 10/27/2016: Magnesium 1.7 10/28/2016: ALT 66; BUN 20; Creatinine, Ser 0.89; Hemoglobin 11.7; Platelets 553; Potassium 3.8; Sodium 140   Lipid Panel    Component Value Date/Time   CHOL 123 08/03/2016 0159   TRIG 142 08/03/2016 0159   HDL 24 (L) 08/03/2016 0159   CHOLHDL 5.1 08/03/2016 0159   VLDL 28 08/03/2016 0159   LDLCALC 71 08/03/2016 0159   LDLDIRECT 151.5 11/20/2007 1623    Additional studies/ records that were reviewed today include:  ECHO: 10/21/2016 - Left ventricle: The cavity size was normal. There was mild concentric hypertrophy. Systolic function was normal. The estimated ejection fraction was in the range of 55% to 60%. Wall motion was normal; there were no regional wall  motion abnormalities. - Aortic valve: Functionally bicuspid (fused right and left cusps); moderately thickened, mildly calcified leaflets. There was moderate stenosis by 2D imaging. The recorded Doppler velocities are probably an underestimation of true transvalvular gradients. There was trivial regurgitation. Valve area (VTI): 2.95 cm^2. Valve area (Vmax): 2.51 cm^2. Valve area (Vmean): 2.39 cm^2. - Mitral valve: There was mild regurgitation directed centrally. - Left atrium: The atrium was moderately dilated. - Atrial septum: No defect or patent foramen ovale was identified. Impressions: - 2D imaging suggests moderate aortic stenosis. Transaortic gradients were much higher on the study from April 2017. Suspect aortic valve gradients are underestimated on the current study due to poor beam alignment. Recommendations: Consider TEE if necessary to better evaluate the aortic valve.    ASSESSMENT & PLAN:   PAF: he is very symptomatic when he has atrial fibrillation. He was discharged on amiodarone but stopped this on his own. Fortunately, he is maintaining NSR today. He has been taking his Eliquis 5mg  BID. CHADSVASC at least 3.  Dr. Aundra Dubin has discussed ablation or flecainide therapy in the past but he has been resistant.   HTN: BP elevated today. He stopped taking Ramipril on his own but restarted HCTZ 12.5mg  daily. He took his BP this AM and it was 140/75. He is very reluctant to start any other medications or increase HCTZ.   Bicuspid AV with aortopathy: echo during most recent admission showed moderate AS but felt that gradients were likely underestimated due to poor beam alignment. Consider TEE to better evaluate aortic valve. Will defer to Dr. Aundra Dubin. Aortic root/ascending aorta trivially dilated on this echo.  MRA chest 08/2016 showed maximal ascending aorta dimension 4.1 cm.     Medication Adjustments/Labs and Tests Ordered: Current medicines are reviewed  at length with the patient today.  Concerns regarding medicines are outlined above.  Medication changes, Labs and Tests ordered today are listed in the Patient Instructions below. Patient Instructions  Medication Instructions:  Your physician recommends that you continue on your current medications as directed. Please refer to the Current Medication list given  to you today.   Labwork: None ordered  Testing/Procedures: None ordered  Follow-Up: Your physician recommends that you schedule a follow-up appointment in: SEE DR. MCLEAN AS PLANNED  Any Other Special Instructions Will Be Listed Below (If Applicable).     If you need a refill on your cardiac medications before your next appointment, please call your pharmacy.      Signed, Angelena Form, PA-C  11/11/2016 4:00 PM    [facility] [address], [place], [place]  [address] Phone: [phone]; Fax: [phone]

## 2016-11-15 NOTE — Progress Notes (Signed)
Reviewed & agree with plan  

## 2016-11-22 ENCOUNTER — Telehealth: Payer: Self-pay | Admitting: Internal Medicine

## 2016-11-22 NOTE — Telephone Encounter (Signed)
OK Thursday 1 pm Thx

## 2016-11-22 NOTE — Telephone Encounter (Signed)
Patient had to cancel his HOSP OV due to weather.  Patient does not want to see anyone else.  Patient is requesting Dr. Camila Li to work him in.  Please advise.

## 2016-11-23 ENCOUNTER — Inpatient Hospital Stay: Payer: Medicare Other | Admitting: Internal Medicine

## 2016-11-23 NOTE — Telephone Encounter (Signed)
Got scheduled  °

## 2016-11-25 ENCOUNTER — Encounter (HOSPITAL_COMMUNITY): Payer: Self-pay

## 2016-11-25 ENCOUNTER — Other Ambulatory Visit (INDEPENDENT_AMBULATORY_CARE_PROVIDER_SITE_OTHER): Payer: Medicare Other

## 2016-11-25 ENCOUNTER — Ambulatory Visit (HOSPITAL_COMMUNITY)
Admission: RE | Admit: 2016-11-25 | Discharge: 2016-11-25 | Disposition: A | Discharge status: Home / Self Care | Payer: Medicare Other | Source: Ambulatory Visit | Attending: Cardiology | Admitting: Cardiology

## 2016-11-25 ENCOUNTER — Ambulatory Visit (INDEPENDENT_AMBULATORY_CARE_PROVIDER_SITE_OTHER): Payer: Medicare Other | Admitting: Internal Medicine

## 2016-11-25 ENCOUNTER — Encounter: Payer: Self-pay | Admitting: Internal Medicine

## 2016-11-25 ENCOUNTER — Other Ambulatory Visit: Payer: Self-pay | Admitting: Internal Medicine

## 2016-11-25 VITALS — BP 128/84 | HR 58 | Wt 189.1 lb

## 2016-11-25 VITALS — BP 128/84 | HR 58 | Temp 97.4°F | Resp 16 | Ht 71.0 in | Wt 189.8 lb

## 2016-11-25 DIAGNOSIS — R202 Paresthesia of skin: Secondary | ICD-10-CM | POA: Diagnosis not present

## 2016-11-25 DIAGNOSIS — I4891 Unspecified atrial fibrillation: Secondary | ICD-10-CM | POA: Diagnosis not present

## 2016-11-25 DIAGNOSIS — I480 Paroxysmal atrial fibrillation: Secondary | ICD-10-CM

## 2016-11-25 DIAGNOSIS — I509 Heart failure, unspecified: Secondary | ICD-10-CM | POA: Diagnosis not present

## 2016-11-25 DIAGNOSIS — I10 Essential (primary) hypertension: Secondary | ICD-10-CM

## 2016-11-25 DIAGNOSIS — H9190 Unspecified hearing loss, unspecified ear: Secondary | ICD-10-CM | POA: Diagnosis not present

## 2016-11-25 DIAGNOSIS — J189 Pneumonia, unspecified organism: Secondary | ICD-10-CM

## 2016-11-25 DIAGNOSIS — R7989 Other specified abnormal findings of blood chemistry: Secondary | ICD-10-CM

## 2016-11-25 DIAGNOSIS — N179 Acute kidney failure, unspecified: Secondary | ICD-10-CM

## 2016-11-25 DIAGNOSIS — S3992XS Unspecified injury of lower back, sequela: Secondary | ICD-10-CM

## 2016-11-25 DIAGNOSIS — Z23 Encounter for immunization: Secondary | ICD-10-CM | POA: Diagnosis not present

## 2016-11-25 DIAGNOSIS — J181 Lobar pneumonia, unspecified organism: Secondary | ICD-10-CM | POA: Diagnosis not present

## 2016-11-25 DIAGNOSIS — R9431 Abnormal electrocardiogram [ECG] [EKG]: Secondary | ICD-10-CM | POA: Diagnosis not present

## 2016-11-25 DIAGNOSIS — I359 Nonrheumatic aortic valve disorder, unspecified: Secondary | ICD-10-CM | POA: Diagnosis not present

## 2016-11-25 DIAGNOSIS — M4854XA Collapsed vertebra, not elsewhere classified, thoracic region, initial encounter for fracture: Secondary | ICD-10-CM | POA: Diagnosis not present

## 2016-11-25 DIAGNOSIS — S22000A Wedge compression fracture of unspecified thoracic vertebra, initial encounter for closed fracture: Secondary | ICD-10-CM | POA: Insufficient documentation

## 2016-11-25 LAB — HEPATIC FUNCTION PANEL
ALT: 18 U/L (ref 0–53)
AST: 19 U/L (ref 0–37)
Albumin: 3.8 g/dL (ref 3.5–5.2)
Alkaline Phosphatase: 94 U/L (ref 39–117)
Bilirubin, Direct: 0.1 mg/dL (ref 0.0–0.3)
Total Bilirubin: 0.4 mg/dL (ref 0.2–1.2)
Total Protein: 7.6 g/dL (ref 6.0–8.3)

## 2016-11-25 LAB — BASIC METABOLIC PANEL
BUN: 18 mg/dL (ref 6–23)
CO2: 30 mEq/L (ref 19–32)
Calcium: 9.3 mg/dL (ref 8.4–10.5)
Chloride: 103 mEq/L (ref 96–112)
Creatinine, Ser: 0.83 mg/dL (ref 0.40–1.50)
GFR: 95.55 mL/min (ref >60.00)
Glucose, Bld: 97 mg/dL (ref 70–99)
Potassium: 3.6 mEq/L (ref 3.5–5.1)
Sodium: 139 mEq/L (ref 135–145)

## 2016-11-25 LAB — TSH: TSH: 5.09 u[IU]/mL — ABNORMAL HIGH (ref 0.35–4.50)

## 2016-11-25 LAB — CBC WITH DIFFERENTIAL/PLATELET
Basophils Absolute: 0 10*3/uL (ref 0.0–0.1)
Basophils Relative: 0.5 % (ref 0.0–3.0)
Eosinophils Absolute: 0.2 10*3/uL (ref 0.0–0.7)
Eosinophils Relative: 3.7 % (ref 0.0–5.0)
HCT: 39.4 % (ref 39.0–52.0)
Hemoglobin: 13.1 g/dL (ref 13.0–17.0)
Lymphocytes Relative: 27.3 % (ref 12.0–46.0)
Lymphs Abs: 1.4 10*3/uL (ref 0.7–4.0)
MCHC: 33.4 g/dL (ref 30.0–36.0)
MCV: 91.5 fl (ref 78.0–100.0)
Monocytes Absolute: 0.5 10*3/uL (ref 0.1–1.0)
Monocytes Relative: 9.5 % (ref 3.0–12.0)
Neutro Abs: 2.9 10*3/uL (ref 1.4–7.7)
Neutrophils Relative %: 59 % (ref 43.0–77.0)
Platelets: 233 10*3/uL (ref 150.0–400.0)
RBC: 4.3 Mil/uL (ref 4.22–5.81)
RDW: 13.6 % (ref 11.5–15.5)
WBC: 5 10*3/uL (ref 4.0–10.5)

## 2016-11-25 LAB — VITAMIN B12: Vitamin B-12: 363 pg/mL (ref 211–911)

## 2016-11-25 NOTE — Assessment & Plan Note (Addendum)
hearing loss after the hosp stay - better now... ENT ref - he saw Dr Lucia Gaskins

## 2016-11-25 NOTE — Assessment & Plan Note (Signed)
Dr Vertell Limber - pt had an MRI

## 2016-11-25 NOTE — Addendum Note (Signed)
Addended by: Valere Dross on: 11/25/2016 02:19 PM   Modules accepted: Orders

## 2016-11-25 NOTE — Assessment & Plan Note (Signed)
Toprol HCTZ

## 2016-11-25 NOTE — Progress Notes (Signed)
Pre-visit discussion using our clinic review tool. No additional management support is needed unless otherwise documented below in the visit note.  

## 2016-11-25 NOTE — Patient Instructions (Signed)
Your physician recommends that you schedule a follow-up appointment in: 3 months.  

## 2016-11-25 NOTE — Assessment & Plan Note (Addendum)
2/18 --severe LUL CAP w/effusion L>>R (2/5-2/15/18)  CHEST CT IMPRESSION:  1. Consolidation and surrounding ground-glass in the left upper lobe is likely due to pneumonia. Followup PA and lateral chest X-ray is recommended in 3-4 weeks following trial of antibiotic therapy to ensure resolution and exclude underlying malignancy. 2. Moderate left pleural effusion with collapse/consolidation in the left lower lobe. 3. Small right pleural effusion with compressive atelectasis in the right lower lobe. 4. Suspect mild edema, raising concern for superimposed congestive heart failure. 5. Aortic atherosclerosis (ICD10-170.0). Coronary artery and aortic valvular calcification.  Finished Levaquin CXR in 1 mo F/u w/pulm - Dr Elsworth Soho

## 2016-11-25 NOTE — Assessment & Plan Note (Signed)
Eliquis, Toprol

## 2016-11-25 NOTE — Assessment & Plan Note (Signed)
Labs

## 2016-11-25 NOTE — Progress Notes (Signed)
Subjective:  Patient ID: [name] [name], male    DOB: Mar 16, 1941  Age: 76 y.o. MRN: [id number]  CC: Hospitalization Follow-up (pneumonia )   HPI Rydell Fatzinger presents for a post-hosp f/u --severe LUL CAP w/effusion L>>R (2/5-2/15/18) - reviewed F/u HTN, A fib Compr fx in 10/17 due to fall C/o hearing loss after the hosp stay - better now...  Outpatient Medications Prior to Visit  Medication Sig Dispense Refill  . apixaban (ELIQUIS) 5 MG TABS tablet Take 1 tablet (5 mg total) by mouth 2 (two) times daily. 60 tablet 3  . Cholecalciferol 1000 UNITS capsule Take 1,000 Units by mouth daily.    . hydrochlorothiazide (HYDRODIURIL) 12.5 MG tablet Take 12.5 mg by mouth daily.    . metoprolol succinate (TOPROL-XL) 50 MG 24 hr tablet Take 1 tablet (50 mg total) by mouth 2 (two) times daily. Take with or immediately following a meal. 180 tablet 3   No facility-administered medications prior to visit.     ROS Review of Systems  Constitutional: Positive for fatigue. Negative for appetite change and unexpected weight change.  HENT: Negative for congestion, nosebleeds, sneezing, sore throat and trouble swallowing.   Eyes: Negative for itching and visual disturbance.  Respiratory: Negative for cough.   Cardiovascular: Negative for chest pain, palpitations and leg swelling.  Gastrointestinal: Negative for abdominal distention, blood in stool, diarrhea and nausea.  Genitourinary: Negative for frequency and hematuria.  Musculoskeletal: Positive for back pain. Negative for gait problem, joint swelling and neck pain.  Skin: Negative for rash.  Neurological: Negative for dizziness, tremors, speech difficulty and weakness.  Psychiatric/Behavioral: Negative for agitation, dysphoric mood and sleep disturbance. The patient is not nervous/anxious.     Objective:  BP 128/84   Pulse (!) 58   Temp 97.4 F (36.3 C) (Oral)   Resp 16   Ht 5\' 11"  (1.803 m)   Wt 189 lb 12 oz (86.1 kg)   SpO2 98%   BMI  26.46 kg/m   BP Readings from Last 3 Encounters:  11/25/16 128/84  11/11/16 (!) 162/86  11/05/16 134/84    Wt Readings from Last 3 Encounters:  11/25/16 189 lb 12 oz (86.1 kg)  11/11/16 187 lb 6.4 oz (85 kg)  11/05/16 185 lb (83.9 kg)    Physical Exam  Constitutional: He is oriented to person, place, and time. He appears well-developed. No distress.  NAD  HENT:  Mouth/Throat: Oropharynx is clear and moist.  Eyes: Conjunctivae are normal. Pupils are equal, round, and reactive to light.  Neck: Normal range of motion. No JVD present. No thyromegaly present.  Cardiovascular: Normal rate, regular rhythm, normal heart sounds and intact distal pulses.  Exam reveals no gallop and no friction rub.   No murmur heard. Pulmonary/Chest: Effort normal and breath sounds normal. No respiratory distress. He has no wheezes. He has no rales. He exhibits no tenderness.  Abdominal: Soft. Bowel sounds are normal. He exhibits no distension and no mass. There is no rebound and no guarding.  Musculoskeletal: Normal range of motion. He exhibits tenderness. He exhibits no edema.  Lymphadenopathy:    He has no cervical adenopathy.  Neurological: He is alert and oriented to person, place, and time. He has normal reflexes. No cranial nerve deficit. He exhibits normal muscle tone. He displays a negative Romberg sign. Coordination and gait normal.  Skin: Skin is warm and dry. No rash noted.  Psychiatric: He has a normal mood and affect. His behavior is normal. Judgment and thought content  normal.  decr hearing B LS tender Lower thoracic spine -- tender  FTF>53 min discussing his latest issues  Lab Results  Component Value Date   WBC 7.2 10/28/2016   HGB 11.7 (L) 10/28/2016   HCT 35.0 (L) 10/28/2016   PLT 553 (H) 10/28/2016   GLUCOSE 115 (H) 10/28/2016   CHOL 123 08/03/2016   TRIG 142 08/03/2016   HDL 24 (L) 08/03/2016   LDLDIRECT 151.5 11/20/2007   LDLCALC 71 08/03/2016   ALT 66 (H) 10/28/2016    AST 58 (H) 10/28/2016   NA 140 10/28/2016   K 3.8 10/28/2016   CL 103 10/28/2016   CREATININE 0.89 10/28/2016   BUN 20 10/28/2016   CO2 31 10/28/2016   TSH 3.387 08/02/2016   PSA 2.76 03/04/2015   INR 1.14 08/02/2016   HGBA1C 5.7 (H) 08/02/2016    Dg Chest 2 View  Result Date: 11/05/2016 CLINICAL DATA:  Follow-up pneumonia EXAM: CHEST  2 VIEW COMPARISON:  10/28/2016 FINDINGS: Patchy left upper lobe opacity, improving, compatible with improving pneumonia. Mild left basilar opacity, likely atelectasis. Mild right basilar scarring/ atelectasis. Suspected small pleural effusion. The heart is top-normal in size. Mild degenerative changes of the visualized thoracolumbar spine. IMPRESSION: Improving left upper lobe pneumonia. Suspected small left pleural effusion. Electronically Signed   By: Julian Hy M.D.   On: 11/05/2016 15:59   CHEST CT IMPRESSION:  1. Consolidation and surrounding ground-glass in the left upper lobe is likely due to pneumonia. Followup PA and lateral chest X-ray is recommended in 3-4 weeks following trial of antibiotic therapy to ensure resolution and exclude underlying malignancy. 2. Moderate left pleural effusion with collapse/consolidation in the left lower lobe. 3. Small right pleural effusion with compressive atelectasis in the right lower lobe. 4. Suspect mild edema, raising concern for superimposed congestive heart failure. 5. Aortic atherosclerosis (ICD10-170.0). Coronary artery and aortic valvular calcification.   Electronically Signed   By: Lorin Picket M.D.   On: 10/20/2016 16:02   Assessment & Plan:   There are no diagnoses linked to this encounter. I am having Mr. Gettis maintain his Cholecalciferol, metoprolol succinate, apixaban, and hydrochlorothiazide.  No orders of the defined types were placed in this encounter.    Follow-up: No Follow-up on file.  [name], MD

## 2016-11-27 NOTE — Progress Notes (Signed)
Patient ID: [name] [name], male   DOB: September 16, 1940, 75 y.o.   MRN: [id number] PCP: Dr. Alain Marion Cardiology: Dr. Aundra Dubin  76 yo with history of HTN and paroxysmal atrial fibrillation returns for followup. He was hospitalized with atrial fibrillation/RVR in 5/14.  He had TEE-guided cardioversion.  TEE showed bicuspid aortic valve with mild AS and a moderately dilated ascending aorta.  Most recent echo in 4/17 showed moderate aortic stenosis and MRA chest in 11/17 showed 4.1 cm ascending aorta.   He was on Xarelto for anticoagulation but stopped it as he was convinced it was causing joint pains.  He then refused to start any other anticoagulation.    In 5/17, he had been back in atrial fibrillation for several days and was symptomatic.  I started him on Eliquis and planned TEE-guided DCCV given significant symptoms, but he converted back to NSR on his own.  He continued Eliquis for about 1 month then stopped it on his own.    In 11/17, he was hospitalized with symptomatic atrial fibrillation with RVR.  I started him on diltiazem CD and Eliquis with plan for TEE-guided DCCV.  However, he converted back to NSR on his own. He stopped the diltiazem but has continued the Eliquis.   He was admitted in 2/18 with fever, LUL PNA and left-sided pleural effusion.  Thoracentesis on left was suggestive of parapneumonic effusion.  He was in the hospital 11 days.  During that time, he went into atrial fibrillation with RVR.  He was started on amiodarone and went back into NSR.  He is in NSR today.    After discharge, he had trouble with his appetite and with sleeping.  He thought that this was due to amiodarone so stopped it.  Appetite and sleeping are now better.  Today, he is in NSR.  He has not felt palpitations.  No chest pain, no exertional dyspnea.  He has fatigued more easily since last hospitalization.  Weight is down 12 lbs.    ECG (personally reviewed): NSR, lateral Qs   Labs (5/14): K 3.7, creatinine 0.9,  BNP 2261=>109, TSH normal Labs (7/14): K 3.5, creatinine 1.0 Labs (12/14): K 3.8, creatinine 1.0, LDL particle number 1374, LDL 107, TSH normal Labs (6/16): TSH normal, K 3.8, creatinine 0.84, HCT 42.4, LDL 85, LFTs normal Labs (3/17): K 3.8, creatinine 0.87 Labs (5/17): K 4.5, creatinine 0.97, HCT 43.7 Labs (11/17): K 3.2, creatinine 0.81, LDL 71, HDL 24 Labs (2/18): K 3.8, creatinine 0.89  Allergies (verified):  No Known Drug Allergies   Past Medical History:  1. Hypertension: ACEI cough.  2. Atrial fibrillation. The patient had new-onset atrial fibrillation in November 2009. He underwent ibutilide cardioversion successfully. He has had 1-2 episodes/year that are short-lived that likely are atrial fibrillation with RVR. He was admitted in 5/14 with atrial fibrillation/RVR and had TEE-guided DCCV.  Atrial fibrillation again in 5/17, converted back to NSR spontaneously.  CHADSVASC score 2.  3. Hypercholesterolemia.  4. Bicuspid aortic valve disorder: Echo (7/11): EF 55-60%, mild LV hypertrophy, mild aortic stenosis with mean gradient 19 mmHg and peak gradient 36 mmHg.  TEE (5/14): EF 55%, mild LVH, bicuspid aortic valve with mild AS (mean gradient 13 mmHg), ascending aorta 4.5 cm.  Echo (9/15) with EF 65-70%, moderate AS (mean gradient 23 mmHg), mild AI, ascending aorta 4.1 cm, mild MR.   - Echo (4/17) with EF 65-70%, moderate aortic stenosis with mean gradient 27 mmHg, PASP 31 mmHg, ascending aorta 4.4 cm.  - Echo (  2/18) with EF 55-60%, bicuspid aortic valve with moderate aortic stenosis (underestimated gradient).  5. Osteoarthritis.  6. Low back pain.  7. Chest pain: ETT-myoview (12/11) with 10:51 exercise, no chest pain, no significant ST changes, EF 69%, no evidence for ischemia or infarction.  8. Ascending aortic aneurysm: Associated with bicuspid aortic valve.  4.5 cm by TEE in 5/14.   MRA chest (6/14) with bicuspid aortic valve, 4.3 cm ascending aortic aneurysm.  MRA chest (10/15) with  4.2 cm ascending aorta (bicuspid aortic valve noted).  Echo (4/17) with ascending aorta diameter 4.4 cm.  - MRA chest (11/17) with 4.1 cm ascending aorta.   Family History:  Hypertension. No early CAD.   Social History:  The patient lives in [place]. He is a native of [place]. He is retired Chief Financial Officer at [employer]. He drinks a glass of wine nightly. He is a nonsmoker. He is a former Microbiologist.   Review of Systems  All systems reviewed and negative except as per HPI.   Current Outpatient Prescriptions  Medication Sig Dispense Refill  . apixaban (ELIQUIS) 5 MG TABS tablet Take 1 tablet (5 mg total) by mouth 2 (two) times daily. 60 tablet 3  . Cholecalciferol 1000 UNITS capsule Take 1,000 Units by mouth daily.    . hydrochlorothiazide (HYDRODIURIL) 12.5 MG tablet Take 12.5 mg by mouth daily.    . metoprolol succinate (TOPROL-XL) 50 MG 24 hr tablet Take 1 tablet (50 mg total) by mouth 2 (two) times daily. Take with or immediately following a meal. 180 tablet 3   No current facility-administered medications for this encounter.     BP 128/84   Pulse (!) 58   Wt 189 lb 2 oz (85.8 kg)   SpO2 98%   BMI 26.38 kg/m  General: NAD Neck: No JVD, no thyromegaly or thyroid nodule.  Lungs: Occasional rhonchi.  CV: Nondisplaced PMI.  Heart regular S1/S2, no S3/S4, 2/6 SEM RUSB with clear S2.  Trace ankle edema.  No carotid bruit.  Normal pedal pulses.  Abdomen: Soft, nontender, no hepatosplenomegaly, no distention.  Neurologic: Alert and oriented x 3.  Psych: Normal affect. Extremities: No clubbing or cyanosis.   Assessment/Plan: 1. Atrial fibrillation: Paroxysmal. He is quite symptomatic when in atrial fibrillation.  CHADSVASC = 3.    - He does not want to take an anti-arrhythmic medication. He was sent home from the hospital on amiodarone but stopped it.  He does not want to restart amiodarone or any other anti-arrhythmic.  - In the past, he has refused to stay on an  anticoagulant long-term.  However, he so far is willing to continue apixaban.  We discussed the lowering of CVA risk with NOAC use.   - We discussed atrial fibrillation ablation today.  I think this is reasonable with more frequent symptomatic atrial fibrillation episodes.  He wants to recover from the most recent hospitalization, so I will refer him to EP after next appointment.  - Continue Toprol XL.  2. HTN: BP controlled.  He never started ramipril.  3. Bicuspid aortic valve disorder: Moderate AS on 2/18 echo, stable.  Echo in 2017 showed ascending aorta 4.4 cm but MRA chest showed maximal ascending aorta dimension 4.1 cm.  Repeat MRA chest in 11/18.    Followup with me in 3 months  [name] 11/27/2016

## 2016-12-14 DIAGNOSIS — H906 Mixed conductive and sensorineural hearing loss, bilateral: Secondary | ICD-10-CM | POA: Diagnosis not present

## 2016-12-21 ENCOUNTER — Encounter: Payer: Self-pay | Admitting: Pulmonary Disease

## 2016-12-21 ENCOUNTER — Ambulatory Visit (INDEPENDENT_AMBULATORY_CARE_PROVIDER_SITE_OTHER): Payer: Medicare Other | Admitting: Pulmonary Disease

## 2016-12-21 ENCOUNTER — Ambulatory Visit (INDEPENDENT_AMBULATORY_CARE_PROVIDER_SITE_OTHER)
Admission: RE | Admit: 2016-12-21 | Discharge: 2016-12-21 | Disposition: A | Discharge status: Home / Self Care | Payer: Medicare Other | Source: Ambulatory Visit | Attending: Pulmonary Disease | Admitting: Pulmonary Disease

## 2016-12-21 VITALS — BP 120/78 | HR 66 | Ht 71.0 in | Wt 187.0 lb

## 2016-12-21 DIAGNOSIS — J189 Pneumonia, unspecified organism: Secondary | ICD-10-CM | POA: Diagnosis not present

## 2016-12-21 DIAGNOSIS — J918 Pleural effusion in other conditions classified elsewhere: Secondary | ICD-10-CM

## 2016-12-21 DIAGNOSIS — J181 Lobar pneumonia, unspecified organism: Secondary | ICD-10-CM

## 2016-12-21 DIAGNOSIS — J90 Pleural effusion, not elsewhere classified: Secondary | ICD-10-CM | POA: Diagnosis not present

## 2016-12-21 DIAGNOSIS — J13 Pneumonia due to Streptococcus pneumoniae: Secondary | ICD-10-CM | POA: Diagnosis not present

## 2016-12-21 NOTE — Assessment & Plan Note (Signed)
Much improved, small effusion persists Repeat thoracentesis is showing that this is benign Follow-up chest x-ray in 6 months should suffice

## 2016-12-21 NOTE — Assessment & Plan Note (Signed)
Resolved on chest x-ray

## 2016-12-21 NOTE — Progress Notes (Signed)
   Subjective:    Patient ID: [name] [name], male    DOB: 10/24/1940, 76 y.o.   MRN: [id number]  HPI  76 year old male never smoker seen for pulmonary consult 10/21/2016 during hospitalization for pleural effusion and PNA.   10/2016 He was admitted for a left streptococcal pneumonia a community-acquired pneumonia with sepsis and a left parapneumonic pleural effusion.  He underwent a left-sided thoracentesis x 2 . C/w parapnumonic process. He was treated with antibiotics for a total 14 days of therapy  He feels better but is not fully back to baseline. Cough has resolved as his dyspnea. He still complains of weakness in both his extremities but can ambulate and walk for longer distances. No cough or fevers I reviewed chest x-ray today which still shows a small left effusion  He underwent evaluation and CHF, chronic and moderate aortic stenosis is being followed  Discharge labs & fluid results were reviewed  Past Medical History:  Diagnosis Date  . Aortic stenosis, mild    Echo 07/11: 55-60%, mild LV hypertrophy, mild aortic stenosis w/mean gradient 68mmHg and peak gradient 36 mmHg.  . Ascending aortic aneurysm ([place])   . Bicuspid aortic valve   . Chest pain    ETT-myoview 12/11 w/exercise, no chest pain, no significant ST changes, EF 69%, no evidence for ischemia or infarction.  . CHF NYHA class I (no symptoms from ordinary activities), acute, diastolic ([place])   . HTN (hypertension)   . Hypercholesteremia   . LBP (low back pain)   . Moderate aortic stenosis   . Osteoarthritis   . Paroxysmal atrial fibrillation ([place])    a. new onset Afib in 07/2008. He underwent ibutilide cardioversion successfully. b. Recurrence 01/2013 s/p TEE/DCCV - was on Xarelto but he stopped it as he was convinced it was causing joint pn. c. Recurrence 01/2016 - spont conv to NSR. Pt took Eliquis x1 mo then declined further anticoag. d. Recurrence 07/2016.     Review of Systems neg for any significant sore  throat, dysphagia, itching, sneezing, nasal congestion or excess/ purulent secretions, fever, chills, sweats, unintended wt loss, pleuritic or exertional cp, hempoptysis, orthopnea pnd or change in chronic leg swelling. Also denies presyncope, palpitations, heartburn, abdominal pain, nausea, vomiting, diarrhea or change in bowel or urinary habits, dysuria,hematuria, rash, arthralgias, visual complaints, headache, numbness weakness or ataxia.     Objective:   Physical Exam  Gen. Pleasant, well-nourished, in no distress ENT - no thrush, no post nasal drip Neck: No JVD, no thyromegaly, no carotid bruits Lungs: no use of accessory muscles, no dullness to percussion, clear without rales or rhonchi  Cardiovascular: Rhythm regular, heart sounds  normal, no murmurs or gallops, no peripheral edema Musculoskeletal: No deformities, no cyanosis or clubbing        Assessment & Plan:

## 2016-12-21 NOTE — Patient Instructions (Signed)
CXR in 6 months

## 2016-12-23 ENCOUNTER — Encounter: Payer: Self-pay | Admitting: Internal Medicine

## 2016-12-23 ENCOUNTER — Ambulatory Visit (INDEPENDENT_AMBULATORY_CARE_PROVIDER_SITE_OTHER): Payer: Medicare Other | Admitting: Internal Medicine

## 2016-12-23 ENCOUNTER — Other Ambulatory Visit (INDEPENDENT_AMBULATORY_CARE_PROVIDER_SITE_OTHER): Payer: Medicare Other

## 2016-12-23 DIAGNOSIS — G8929 Other chronic pain: Secondary | ICD-10-CM

## 2016-12-23 DIAGNOSIS — I10 Essential (primary) hypertension: Secondary | ICD-10-CM

## 2016-12-23 DIAGNOSIS — M545 Low back pain, unspecified: Secondary | ICD-10-CM

## 2016-12-23 DIAGNOSIS — R946 Abnormal results of thyroid function studies: Secondary | ICD-10-CM | POA: Diagnosis not present

## 2016-12-23 DIAGNOSIS — R7989 Other specified abnormal findings of blood chemistry: Secondary | ICD-10-CM

## 2016-12-23 DIAGNOSIS — I480 Paroxysmal atrial fibrillation: Secondary | ICD-10-CM

## 2016-12-23 LAB — T4, FREE: Free T4: 0.71 ng/dL (ref 0.60–1.60)

## 2016-12-23 LAB — TSH: TSH: 4.26 u[IU]/mL (ref 0.35–4.50)

## 2016-12-23 NOTE — Progress Notes (Signed)
Pre visit review using our clinic review tool, if applicable. No additional management support is needed unless otherwise documented below in the visit note. 

## 2016-12-23 NOTE — Assessment & Plan Note (Signed)
Labs  - FT4

## 2016-12-23 NOTE — Progress Notes (Signed)
Subjective:  Patient ID: [name] [name], male    DOB: [birth year]  Age: 76 y.o. MRN: [id number]  CC: No chief complaint on file.   HPI [name] [name] presents for L CAP and L pleural effusion f/u C/o aches all over F/u HTN  Outpatient Medications Prior to Visit  Medication Sig Dispense Refill  . apixaban (ELIQUIS) 5 MG TABS tablet Take 1 tablet (5 mg total) by mouth 2 (two) times daily. 60 tablet 3  . Cholecalciferol 1000 UNITS capsule Take 1,000 Units by mouth daily.    . hydrochlorothiazide (HYDRODIURIL) 12.5 MG tablet Take 12.5 mg by mouth daily.    . metoprolol succinate (TOPROL-XL) 50 MG 24 hr tablet Take 1 tablet (50 mg total) by mouth 2 (two) times daily. Take with or immediately following a meal. 180 tablet 3   No facility-administered medications prior to visit.     ROS Review of Systems  Constitutional: Positive for fatigue and unexpected weight change. Negative for appetite change.  HENT: Negative for congestion, nosebleeds, sneezing, sore throat and trouble swallowing.   Eyes: Negative for itching and visual disturbance.  Respiratory: Negative for cough.   Cardiovascular: Negative for chest pain, palpitations and leg swelling.  Gastrointestinal: Negative for abdominal distention, blood in stool, diarrhea and nausea.  Genitourinary: Negative for frequency and hematuria.  Musculoskeletal: Positive for arthralgias and back pain. Negative for gait problem, joint swelling and neck pain.  Skin: Negative for rash.  Neurological: Negative for dizziness, tremors, speech difficulty and weakness.  Psychiatric/Behavioral: Negative for agitation, dysphoric mood and sleep disturbance. The patient is nervous/anxious.     Objective:  BP (!) 160/85   Pulse 60   Temp 97.6 F (36.4 C) (Oral)   Ht 5\' 11"  (1.803 m)   Wt 185 lb (83.9 kg)   SpO2 98%   BMI 25.80 kg/m   BP Readings from Last 3 Encounters:  12/23/16 (!) 160/85  12/21/16 120/78  11/25/16 128/84    Wt Readings  from Last 3 Encounters:  12/23/16 185 lb (83.9 kg)  12/21/16 187 lb (84.8 kg)  11/25/16 189 lb 2 oz (85.8 kg)    Physical Exam  Constitutional: He is oriented to person, place, and time. He appears well-developed. No distress.  NAD  HENT:  Mouth/Throat: Oropharynx is clear and moist.  Eyes: Conjunctivae are normal. Pupils are equal, round, and reactive to light.  Neck: Normal range of motion. No JVD present. No thyromegaly present.  Cardiovascular: Normal rate, regular rhythm and intact distal pulses.  Exam reveals no gallop and no friction rub.   Murmur heard. Pulmonary/Chest: Effort normal and breath sounds normal. No respiratory distress. He has no wheezes. He has no rales. He exhibits no tenderness.  Abdominal: Soft. Bowel sounds are normal. He exhibits no distension and no mass. There is no rebound and no guarding.  Musculoskeletal: Normal range of motion. He exhibits no edema or tenderness.  Lymphadenopathy:    He has no cervical adenopathy.  Neurological: He is alert and oriented to person, place, and time. He has normal reflexes. No cranial nerve deficit. He exhibits normal muscle tone. He displays a negative Romberg sign. Coordination and gait normal.  Skin: Skin is warm and dry. No rash noted.  Psychiatric: He has a normal mood and affect. His behavior is normal. Judgment and thought content normal.  thor spine is tender distally  Lab Results  Component Value Date   WBC 5.0 11/25/2016   HGB 13.1 11/25/2016   HCT 39.4 11/25/2016  PLT 233.0 11/25/2016   GLUCOSE 97 11/25/2016   CHOL 123 08/03/2016   TRIG 142 08/03/2016   HDL 24 (L) 08/03/2016   LDLDIRECT 151.5 11/20/2007   LDLCALC 71 08/03/2016   ALT 18 11/25/2016   AST 19 11/25/2016   NA 139 11/25/2016   K 3.6 11/25/2016   CL 103 11/25/2016   CREATININE 0.83 11/25/2016   BUN 18 11/25/2016   CO2 30 11/25/2016   TSH 5.09 (H) 11/25/2016   PSA 2.76 03/04/2015   INR 1.14 08/02/2016   HGBA1C 5.7 (H) 08/02/2016     Dg Chest 2 View  Result Date: 12/21/2016 CLINICAL DATA:  Acute onset of left-sided chest tightness. Follow-up previously diagnosed pneumonia. EXAM: CHEST  2 VIEW COMPARISON:  Chest radiograph performed 11/05/2016 FINDINGS: Left apical pneumonia has improved in appearance. There is a persistent small left pleural effusion. No pneumothorax is seen. The heart is borderline normal in size. No acute osseous abnormalities are identified. IMPRESSION: Left apical pneumonia has improved in appearance. Persistent small left pleural effusion. Electronically Signed   By: Garald Balding M.D.   On: 12/21/2016 23:51    Assessment & Plan:   There are no diagnoses linked to this encounter. I am having Mr. Demeyer maintain his Cholecalciferol, metoprolol succinate, apixaban, and hydrochlorothiazide.  No orders of the defined types were placed in this encounter.    Follow-up: No Follow-up on file.  [name], MD

## 2016-12-23 NOTE — Assessment & Plan Note (Signed)
Will ref to pain clinic - Dr Jeralyn Bennett in [place]

## 2016-12-23 NOTE — Assessment & Plan Note (Signed)
Eliquis, Toprol to cont

## 2016-12-23 NOTE — Assessment & Plan Note (Signed)
BP OK at home Toprol, HCTZ

## 2017-01-03 DIAGNOSIS — S22080A Wedge compression fracture of T11-T12 vertebra, initial encounter for closed fracture: Secondary | ICD-10-CM | POA: Diagnosis not present

## 2017-01-03 DIAGNOSIS — M5416 Radiculopathy, lumbar region: Secondary | ICD-10-CM | POA: Diagnosis not present

## 2017-01-03 DIAGNOSIS — M545 Low back pain: Secondary | ICD-10-CM | POA: Diagnosis not present

## 2017-01-11 ENCOUNTER — Other Ambulatory Visit: Payer: Self-pay | Admitting: Physician Assistant

## 2017-01-11 NOTE — Telephone Encounter (Signed)
Request received for Eliquis 5mg ; pt is 76 yrs old, wt-73.9kg on 12/23/16, Crea-0.83 on 11/28/16, and last seen by Dr. Aundra Dubin on 11/11/16. Will send in requested refill to requested pharmacy.

## 2017-01-30 ENCOUNTER — Other Ambulatory Visit: Payer: Self-pay | Admitting: Cardiology

## 2017-01-30 DIAGNOSIS — I359 Nonrheumatic aortic valve disorder, unspecified: Secondary | ICD-10-CM

## 2017-01-30 DIAGNOSIS — I480 Paroxysmal atrial fibrillation: Secondary | ICD-10-CM

## 2017-02-03 DIAGNOSIS — M47816 Spondylosis without myelopathy or radiculopathy, lumbar region: Secondary | ICD-10-CM | POA: Diagnosis not present

## 2017-02-03 DIAGNOSIS — M5136 Other intervertebral disc degeneration, lumbar region: Secondary | ICD-10-CM | POA: Diagnosis not present

## 2017-02-04 ENCOUNTER — Other Ambulatory Visit: Payer: Self-pay | Admitting: Otolaryngology

## 2017-02-04 ENCOUNTER — Other Ambulatory Visit (HOSPITAL_COMMUNITY): Payer: Self-pay | Admitting: Otolaryngology

## 2017-02-04 DIAGNOSIS — H903 Sensorineural hearing loss, bilateral: Secondary | ICD-10-CM

## 2017-02-04 DIAGNOSIS — H8143 Vertigo of central origin, bilateral: Secondary | ICD-10-CM | POA: Diagnosis not present

## 2017-02-10 DIAGNOSIS — M5136 Other intervertebral disc degeneration, lumbar region: Secondary | ICD-10-CM | POA: Diagnosis not present

## 2017-02-10 DIAGNOSIS — M47816 Spondylosis without myelopathy or radiculopathy, lumbar region: Secondary | ICD-10-CM | POA: Diagnosis not present

## 2017-02-17 ENCOUNTER — Ambulatory Visit (HOSPITAL_COMMUNITY)
Admission: RE | Admit: 2017-02-17 | Discharge: 2017-02-17 | Disposition: A | Discharge status: Home / Self Care | Payer: Medicare Other | Source: Ambulatory Visit | Attending: Otolaryngology | Admitting: Otolaryngology

## 2017-02-17 DIAGNOSIS — H8143 Vertigo of central origin, bilateral: Secondary | ICD-10-CM | POA: Diagnosis present

## 2017-02-17 DIAGNOSIS — H748X3 Other specified disorders of middle ear and mastoid, bilateral: Secondary | ICD-10-CM | POA: Insufficient documentation

## 2017-02-17 DIAGNOSIS — H903 Sensorineural hearing loss, bilateral: Secondary | ICD-10-CM | POA: Insufficient documentation

## 2017-02-17 DIAGNOSIS — R42 Dizziness and giddiness: Secondary | ICD-10-CM | POA: Diagnosis not present

## 2017-02-17 LAB — CREATININE, SERUM
Creatinine, Ser: 0.91 mg/dL (ref 0.61–1.24)
GFR calc Af Amer: >60 mL/min (ref >60)
GFR calc non Af Amer: >60 mL/min (ref >60)

## 2017-02-17 MED ORDER — GADOBENATE DIMEGLUMINE 529 MG/ML IV SOLN
18.0000 mL | Freq: Once | INTRAVENOUS | Status: AC | PRN
  Administered 2017-02-17: 18 mL via INTRAVENOUS

## 2017-02-23 DIAGNOSIS — H8143 Vertigo of central origin, bilateral: Secondary | ICD-10-CM | POA: Diagnosis not present

## 2017-02-23 DIAGNOSIS — H7013 Chronic mastoiditis, bilateral: Secondary | ICD-10-CM | POA: Diagnosis not present

## 2017-02-23 DIAGNOSIS — H903 Sensorineural hearing loss, bilateral: Secondary | ICD-10-CM | POA: Diagnosis not present

## 2017-03-03 DIAGNOSIS — M5417 Radiculopathy, lumbosacral region: Secondary | ICD-10-CM | POA: Diagnosis not present

## 2017-03-04 ENCOUNTER — Encounter (HOSPITAL_COMMUNITY): Payer: Medicare Other

## 2017-03-07 ENCOUNTER — Encounter: Payer: Self-pay | Admitting: Neurology

## 2017-03-07 ENCOUNTER — Ambulatory Visit (INDEPENDENT_AMBULATORY_CARE_PROVIDER_SITE_OTHER): Payer: Medicare Other | Admitting: Neurology

## 2017-03-07 VITALS — BP 152/84 | HR 58 | Ht 71.0 in | Wt 197.0 lb

## 2017-03-07 DIAGNOSIS — H7093 Unspecified mastoiditis, bilateral: Secondary | ICD-10-CM

## 2017-03-07 NOTE — Progress Notes (Signed)
[facility]    Provider:  Dr Jaynee Eagles Referring Provider:  Primary Care Physician:  [name], [name], MD  CC:  Hearing loss and vertigo  HPI:  [name] [name] is a 76 y.o. male here as a referral from Dr. Ernesto Rutherford for hearing loss and vertigo. PMHx of recent hospitalization for severe pneumonia and complicated hospital course including sepsis with pleural effusions and multiple thoracocentesis. Patient had severe pneumonia. He has chronic back pain. He has dizziness and hearing loss in the setting of mastoiditis. Dizziness started after the hospital. He feels like he walks side to side. Never had ear problems as a child. He lost his hearing but now it is better.His speech is improved. Discussed recommend repeating MRi brain in 8 weeks He has pressure moreso in the left ear rather than the right. Symptoms worse on the left ear and when laying down. No other focal neurologic deficits, associated symptoms, inciting events or modifiable factors.  Reviewed notes, labs and imaging from outside physicians, which showed: Review Dr. Berle Mull notes. Patient had a hospitalization with severe pneumonia where he had hypertension, bicuspid aortic valve issue, dyslipidemia but also left streptococcal pneumonia, community-acquired ammonia, sepsis with left parapneumonic pleural effusion. He had 1 L of blood fluid drainage from his left lung. He had a thoracentesis again yielding 800 mL of fluid. He also had hypokalemia, hypomagnesemia, hearing loss, urinary frequency, acute kidney injury. His hearing is moderate conductive factor and 84% discrimination score right and left and 50 and 45 dB speech reception threshold. An MRI in June 2018 does have a complicated appearing left mastoid effusion suspicious for infectious mastoiditis. No dural intracranial involvement. No periarticular soft tissue extension. No other IAC abnormality. Normal age MRI appearance of the brain. He also sees Dr. Vertell Limber for  low back pain. Exam was unremarkable including the tympanic membrane, the nose, nose and throat are completely clear of ulceration mass or lesion, everything else unremarkable. Diagnosis was bilateral mastoiditis, bilateral sensorineural loss with bilateral vertigo.  TSH, B12 normal  Review of Systems: Patient complains of symptoms per HPI as well as the following symptoms: no CP, no SOB. Pertinent negatives and positives per HPI. All others negative.   Social History   Social History  . Marital status: Married    Spouse name: N/A  . Number of children: N/A  . Years of education: N/A   Occupational History  . Retired Biochemist, clinical [employer]   Social History Main Topics  . Smoking status: Never Smoker  . Smokeless tobacco: Never Used  . Alcohol use 0.0 oz/week     Comment: Drinks 1 glass of wine nightly/socially  . Drug use: No  . Sexual activity: Yes   Other Topics Concern  . Not on file   Social History Narrative   Patient lives in [place] w/ his wife. He is a native of [place]. 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   Right-handed   Caffeine: 2 cups coffee per day       Family History  Problem Relation Age of Onset  . Colon cancer Mother 1  . Hypertension Other   . Coronary artery disease Neg Hx     Past Medical History:  Diagnosis Date  . Aortic stenosis, mild    Echo 07/11: 55-60%, mild LV hypertrophy, mild aortic stenosis w/mean gradient 33mmHg and peak gradient 36 mmHg.  . Ascending aortic aneurysm ([place])   . Bicuspid aortic valve   .  Chest pain    ETT-myoview 12/11 w/exercise, no chest pain, no significant ST changes, EF 69%, no evidence for ischemia or infarction.  . CHF NYHA class I (no symptoms from ordinary activities), acute, diastolic ([place])   . HTN (hypertension)   . Hypercholesteremia   . LBP (low back pain)   . Moderate aortic stenosis   . Osteoarthritis   . Paroxysmal atrial fibrillation ([place])     a. new onset Afib in 07/2008. He underwent ibutilide cardioversion successfully. b. Recurrence 01/2013 s/p TEE/DCCV - was on Xarelto but he stopped it as he was convinced it was causing joint pn. c. Recurrence 01/2016 - spont conv to NSR. Pt took Eliquis x1 mo then declined further anticoag. d. Recurrence 07/2016.    Past Surgical History:  Procedure Laterality Date  . BACK SURGERY  x12 years ago  . CARDIOVERSION N/A 01/26/2013   Procedure: CARDIOVERSION;  Surgeon: Larey Dresser, MD;  Location: [facility];  Service: Cardiovascular;  Laterality: N/A;  . HEMORRHOID SURGERY    . LUMBAR LAMINECTOMY    . TEE WITHOUT CARDIOVERSION N/A 01/26/2013   Procedure: TRANSESOPHAGEAL ECHOCARDIOGRAM (TEE);  Surgeon: Larey Dresser, MD;  Location: [facility] ENDOSCOPY;  Service: Cardiovascular;  Laterality: N/A;    Current Outpatient Prescriptions  Medication Sig Dispense Refill  . aspirin 81 MG chewable tablet Chew 81 mg by mouth daily.    . Cholecalciferol 1000 UNITS capsule Take 1,000 Units by mouth daily.    . hydrochlorothiazide (HYDRODIURIL) 12.5 MG tablet Take 12.5 mg by mouth daily.    . metoprolol succinate (TOPROL-XL) 50 MG 24 hr tablet TAKE 1 TABLET (50 MG TOTAL) BY MOUTH 2 (TWO) TIMES DAILY. TAKE WITH OR IMMEDIATELY FOLLOWING A MEAL. 180 tablet 3   No current facility-administered medications for this visit.     Allergies as of 03/07/2017 - Review Complete 03/07/2017  Allergen Reaction Noted  . Xarelto [rivaroxaban] Other (See Comments) 08/02/2016  . Benazepril Cough 10/10/2012    Vitals: BP (!) 152/84   Pulse (!) 58   Ht 5\' 11"  (1.803 m)   Wt 197 lb (89.4 kg)   BMI 27.48 kg/m  Last Weight:  Wt Readings from Last 1 Encounters:  03/07/17 197 lb (89.4 kg)   Last Height:   Ht Readings from Last 1 Encounters:  03/07/17 5\' 11"  (1.803 m)    Physical exam: Exam: Gen: NAD, conversant, well nourised, obese, well groomed                     CV: RRR, no MRG. No Carotid Bruits. No peripheral  edema, warm, nontender Eyes: Conjunctivae clear without exudates or hemorrhage  Neuro: Detailed Neurologic Exam  Speech:    Speech is normal; fluent and spontaneous with normal comprehension.  Cognition:    The patient is oriented to person, place, and time;     recent and remote memory intact;     language fluent;     normal attention, concentration,     fund of knowledge Cranial Nerves:    The pupils are equal, round, and reactive to light. The fundi are normal and spontaneous venous pulsations are present. Visual fields are full to finger confrontation. Extraocular movements are intact. Trigeminal sensation is intact and the muscles of mastication are normal. The face is symmetric. The palate elevates in the midline. Hearing intact. Voice is normal. Shoulder shrug is normal. The tongue has normal motion without fasciculations.   Coordination:    Normal finger to nose and  heel to shin. Normal rapid alternating movements.   Gait:    Heel-toe and tandem gait are normal.   Motor Observation:    No asymmetry, no atrophy, and no involuntary movements noted. Tone:    Normal muscle tone.    Posture:    Posture is normal. normal erect    Strength:    Strength is V/V in the upper and lower limbs.      Sensation: intact to LT     Reflex Exam:  DTR's:    Deep tendon reflexes in the upper and lower extremities are normal bilaterally.   Toes:    The toes are downgoing bilaterally.   Clonus:    Clonus is absent.      Assessment/Plan:  76 year old male who had a complicated hospital course with resultant hearing abnormality and MRI showing left mastoid effusion no suspicious for infectious mastoiditis however no other complicating features or cerebral spread, no dural or intracranial involvement. On the right he has a mastoid effusion. Patient is improving. The brain was otherwise normal for age. There can be interested cerebral sequelae of mastoiditis including dural sinus venous  thrombosis, abscess,meningitis and others. I do recommend clinical follow-up with Dr. Ernesto Rutherford and I will reorder MRI of the brain in 12 weeks. Discussed with patient that he and he has any new clinical symptoms especially new onset headache, fever, alteration of awareness, meningismus or anything concerning he should go to the emergency room immediately due to the risks above.  Cc: Dr, Demetrius Revel, MD  [facility] [address] [place], [place] [address]  Phone [phone] Fax [phone]

## 2017-03-07 NOTE — Patient Instructions (Signed)
Remember to drink plenty of fluid, eat healthy meals and do not skip any meals. Try to eat protein with a every meal and eat a healthy snack such as fruit or nuts in between meals. Try to keep a regular sleep-wake schedule and try to exercise daily, particularly in the form of walking, 20-30 minutes a day, if you can.   As far as diagnostic testing: Repeat MRI of the brain  I would like to see you back as needed, sooner if we need to. Please call us with any interim questions, concerns, problems, updates or refill requests.   Our phone number is [phone]. We also have an after hours call service for urgent matters and there is a physician on-call for urgent questions. For any emergencies you know to call 911 or go to the nearest emergency room

## 2017-03-09 ENCOUNTER — Encounter: Payer: Self-pay | Admitting: Internal Medicine

## 2017-03-18 ENCOUNTER — Encounter: Payer: Self-pay | Admitting: Internal Medicine

## 2017-03-21 ENCOUNTER — Encounter: Payer: Self-pay | Admitting: Internal Medicine

## 2017-03-21 ENCOUNTER — Ambulatory Visit (INDEPENDENT_AMBULATORY_CARE_PROVIDER_SITE_OTHER): Payer: Medicare Other | Admitting: Internal Medicine

## 2017-03-21 VITALS — BP 160/92 | HR 54 | Temp 97.7°F | Ht 71.0 in | Wt 193.0 lb

## 2017-03-21 DIAGNOSIS — M7061 Trochanteric bursitis, right hip: Secondary | ICD-10-CM | POA: Diagnosis not present

## 2017-03-21 DIAGNOSIS — I480 Paroxysmal atrial fibrillation: Secondary | ICD-10-CM

## 2017-03-21 DIAGNOSIS — H7090 Unspecified mastoiditis, unspecified ear: Secondary | ICD-10-CM

## 2017-03-21 DIAGNOSIS — J13 Pneumonia due to Streptococcus pneumoniae: Secondary | ICD-10-CM

## 2017-03-21 DIAGNOSIS — I10 Essential (primary) hypertension: Secondary | ICD-10-CM | POA: Diagnosis not present

## 2017-03-21 MED ORDER — LORATADINE 10 MG PO TABS: 10.0000 mg | ORAL_TABLET | Freq: Every day | ORAL | 5 refills | Status: DC

## 2017-03-21 MED ORDER — FLUTICASONE PROPIONATE 50 MCG/ACT NA SUSP: 2.0000 | Freq: Every day | NASAL | 5 refills | Status: DC

## 2017-03-21 MED ORDER — METHYLPREDNISOLONE ACETATE 80 MG/ML IJ SUSP
80.0000 mg | Freq: Once | INTRAMUSCULAR | Status: AC
  Administered 2017-03-21: 80 mg via INTRA_ARTICULAR

## 2017-03-21 NOTE — Assessment & Plan Note (Signed)
Due CXR in the fall - Dr Elsworth Soho

## 2017-03-21 NOTE — Assessment & Plan Note (Addendum)
Options discussed - will inject w/steroids

## 2017-03-21 NOTE — Assessment & Plan Note (Signed)
He will f/u w/Dr Crossly  BRAIN MRI IMPRESSION: 1. Complicated appearing left mastoid effusion suspicious for infectious mastoiditis, although appears to spare the left mastoid antrum and tympanic cavity. Also, no other complicating features (no dural or intracranial involvement, and no peri-auricular soft tissue extension). 2. Smaller and simple appearing right mastoid effusion. 3. No other IAC abnormality. 4. Normal for age MRI appearance of the brain.  Electronically Signed: By: Genevie Ann M.D. On: 02/18/2017 07:49

## 2017-03-21 NOTE — Progress Notes (Signed)
Subjective:  Patient ID: [name] [name], male    DOB: 1941-07-05  Age: 76 y.o. MRN: [id number]  CC: No chief complaint on file.   HPI [name] [name] presents for A fib, HTN, CAP f/u C/o R hip pain irrad to the side of the leg x3 wks  Outpatient Medications Prior to Visit  Medication Sig Dispense Refill  . aspirin 81 MG chewable tablet Chew 81 mg by mouth daily.    . Cholecalciferol 1000 UNITS capsule Take 1,000 Units by mouth daily.    . hydrochlorothiazide (HYDRODIURIL) 12.5 MG tablet Take 12.5 mg by mouth daily.    . metoprolol succinate (TOPROL-XL) 50 MG 24 hr tablet TAKE 1 TABLET (50 MG TOTAL) BY MOUTH 2 (TWO) TIMES DAILY. TAKE WITH OR IMMEDIATELY FOLLOWING A MEAL. 180 tablet 3   No facility-administered medications prior to visit.     ROS Review of Systems  Constitutional: Positive for fatigue. Negative for appetite change and unexpected weight change.  HENT: Negative for congestion, nosebleeds, sneezing, sore throat and trouble swallowing.   Eyes: Negative for itching and visual disturbance.  Respiratory: Negative for cough.   Cardiovascular: Negative for chest pain, palpitations and leg swelling.  Gastrointestinal: Negative for abdominal distention, blood in stool, diarrhea and nausea.  Genitourinary: Negative for frequency and hematuria.  Musculoskeletal: Positive for gait problem. Negative for back pain, joint swelling and neck pain.  Skin: Negative for rash.  Neurological: Negative for dizziness, tremors, speech difficulty and weakness.  Psychiatric/Behavioral: Negative for agitation, dysphoric mood, sleep disturbance and suicidal ideas. The patient is not nervous/anxious.     Objective:  BP (!) 160/92 (BP Location: Left Arm, Patient Position: Sitting, Cuff Size: Normal)   Pulse (!) 54   Temp 97.7 F (36.5 C) (Oral)   Ht 5\' 11"  (1.803 m)   Wt 193 lb (87.5 kg)   SpO2 100%   BMI 26.92 kg/m   BP Readings from Last 3 Encounters:  03/21/17 (!) 160/92  03/07/17 (!)  152/84  12/23/16 (!) 160/85    Wt Readings from Last 3 Encounters:  03/21/17 193 lb (87.5 kg)  03/07/17 197 lb (89.4 kg)  12/23/16 185 lb (83.9 kg)    Physical Exam  Constitutional: He is oriented to person, place, and time. He appears well-developed. No distress.  NAD  HENT:  Mouth/Throat: Oropharynx is clear and moist.  Eyes: Conjunctivae are normal. Pupils are equal, round, and reactive to light.  Neck: Normal range of motion. No JVD present. No thyromegaly present.  Cardiovascular: Normal rate, regular rhythm, normal heart sounds and intact distal pulses.  Exam reveals no gallop and no friction rub.   No murmur heard. Pulmonary/Chest: Effort normal and breath sounds normal. No respiratory distress. He has no wheezes. He has no rales. He exhibits no tenderness.  Abdominal: Soft. Bowel sounds are normal. He exhibits no distension and no mass. There is no tenderness. There is no rebound and no guarding.  Musculoskeletal: Normal range of motion. He exhibits tenderness. He exhibits no edema.  Lymphadenopathy:    He has no cervical adenopathy.  Neurological: He is alert and oriented to person, place, and time. He has normal reflexes. No cranial nerve deficit. He exhibits normal muscle tone. He displays a negative Romberg sign. Coordination and gait normal.  Skin: Skin is warm and dry. No rash noted.  Psychiatric: He has a normal mood and affect. His behavior is normal. Judgment and thought content normal.    Procedure Note :     Procedure :  Joint Injection, R  hip   Indication:  Trochanteric bursitis with refractory  chronic pain.   Risks including unsuccessful procedure , bleeding, infection, bruising, skin atrophy, "steroid flare-up" and others were explained to the patient in detail as well as the benefits. Informed consent was obtained and signed.   Tthe patient was placed in a comfortable lateral decubitus position. The point of maximal tenderness was identified. Skin was  prepped with Betadine and alcohol. Then, a 5 cc syringe with a 2 inch long 24-gauge needle was used for a bursa injection.. The needle was advanced  Into the bursa. I injected the bursa with 4 mL of 2% lidocaine and 80 mg of Depo-Medrol .  Band-Aid was applied.   Tolerated well. Complications: None. Good pain relief following the procedure.      Lab Results  Component Value Date   WBC 5.0 11/25/2016   HGB 13.1 11/25/2016   HCT 39.4 11/25/2016   PLT 233.0 11/25/2016   GLUCOSE 97 11/25/2016   CHOL 123 08/03/2016   TRIG 142 08/03/2016   HDL 24 (L) 08/03/2016   LDLDIRECT 151.5 11/20/2007   LDLCALC 71 08/03/2016   ALT 18 11/25/2016   AST 19 11/25/2016   NA 139 11/25/2016   K 3.6 11/25/2016   CL 103 11/25/2016   CREATININE 0.91 02/17/2017   BUN 18 11/25/2016   CO2 30 11/25/2016   TSH 4.26 12/23/2016   PSA 2.76 03/04/2015   INR 1.14 08/02/2016   HGBA1C 5.7 (H) 08/02/2016    Mr Brain W Wo Contrast  Addendum Date: 02/18/2017   ADDENDUM REPORT: 02/18/2017 12:42 ADDENDUM: Study discussed by telephone with Dr. Minna Merritts on 02/18/2017 at 12:42 . Electronically Signed   By: Genevie Ann M.D.   On: 02/18/2017 12:42   Result Date: 02/18/2017 CLINICAL DATA:  76 year old male with bilateral hearing loss. Vertigo. Pressure in both ears. Symptoms began following hospitalization for pneumonia this February. EXAM: MRI HEAD WITHOUT AND WITH CONTRAST TECHNIQUE: Multiplanar, multiecho pulse sequences of the brain and surrounding structures were obtained without and with intravenous contrast. CONTRAST:  17mL MULTIHANCE GADOBENATE DIMEGLUMINE 529 MG/ML IV SOLN COMPARISON:  None. FINDINGS: Brain: Cerebral volume is within normal limits for age. No restricted diffusion to suggest acute infarction. No midline shift, mass effect, evidence of mass lesion, ventriculomegaly, extra-axial collection or acute intracranial hemorrhage. Cervicomedullary junction and pituitary are within normal limits. No cortical  encephalomalacia or chronic cerebral blood products. Pearline Cables and white matter signal is within normal limits for age throughout the brain. No abnormal enhancement identified. No dural thickening. Vascular: Major intracranial vascular flow voids are preserved. Skull and upper cervical spine: Bone marrow signal remains normal at the skullbase. Negative visualized cervical spine. Calvarium intact. Sinuses/Orbits: Normal orbit soft tissues. Normal cavernous sinus. Paranasal sinuses are clear. Negative scalp soft tissues. Other: Abnormal left mastoid air cells. Confluent opacification of the posterior and inferior air cells which are abnormally enhancing (series 10, image 17 versus series 16, image 11) and demonstrate suspicious diffusion changes visible on coronal DO view i.e. (series 5, image 13). Despite this the left mastoid antrum may remain pneumatized, and there is no definite left tympanic cavity fluid or opacification. No overlying dural thickening along the inferior left temporal lobe. No bone destruction or ir extension into the left periauricular soft tissues. Also, the left transverse and sigmoid sinuses remain patent. The left cerebellopontine angle, left cisternal and intracanalicular 7th and 8th cranial nerve segments, left cochlea and vestibular structures remain normal. Contralateral right  mastoids also demonstrates some fluid signal, but this is not confluent and there is no definite enhancement. Right cerebellopontine angle, right cisternal and intracanalicular 7th and 8th cranial nerve segments, and cochlea and vestibular structures remain normal. No abnormal middle ear enhancement. Normal styloid mastoid foramina and visible parotid glands. IMPRESSION: 1. Complicated appearing left mastoid effusion suspicious for infectious mastoiditis, although appears to spare the left mastoid antrum and tympanic cavity. Also, no other complicating features (no dural or intracranial involvement, and no peri-auricular  soft tissue extension). 2. Smaller and simple appearing right mastoid effusion. 3. No other IAC abnormality. 4. Normal for age MRI appearance of the brain. Electronically Signed: By: Genevie Ann M.D. On: 02/18/2017 07:49    Assessment & Plan:   Diagnoses and all orders for this visit:  Essential hypertension  PAF (paroxysmal atrial fibrillation) ([place])  Pneumonia of both upper lobes due to Streptococcus pneumoniae (HCC)  Mastoiditis, unspecified laterality  Trochanteric bursitis of right hip -     methylPREDNISolone acetate (DEPO-MEDROL) injection 80 mg; Inject 1 mL (80 mg total) into the articular space once.  Other orders -     loratadine (CLARITIN) 10 MG tablet; Take 1 tablet (10 mg total) by mouth daily. -     fluticasone (FLONASE) 50 MCG/ACT nasal spray; Place 2 sprays into both nostrils daily.   I am having Mr. Dickenson start on loratadine and fluticasone. I am also having him maintain his Cholecalciferol, hydrochlorothiazide, metoprolol succinate, and aspirin. We administered methylPREDNISolone acetate.  Meds ordered this encounter  Medications  . loratadine (CLARITIN) 10 MG tablet    Sig: Take 1 tablet (10 mg total) by mouth daily.    Dispense:  30 tablet    Refill:  5  . fluticasone (FLONASE) 50 MCG/ACT nasal spray    Sig: Place 2 sprays into both nostrils daily.    Dispense:  16 g    Refill:  5  . methylPREDNISolone acetate (DEPO-MEDROL) injection 80 mg     Follow-up: Return in about 3 months (around 06/21/2017) for a follow-up visit.  [name], MD

## 2017-03-21 NOTE — Assessment & Plan Note (Signed)
Metoprolol, Hydrodiuril 

## 2017-03-21 NOTE — Assessment & Plan Note (Signed)
Eliquis, Toprol Rate controlled

## 2017-03-21 NOTE — Patient Instructions (Addendum)
IT band stretching   Postprocedure instructions :    A Band-Aid should be left on for 12 hours. Injection therapy is not a cure itself. It is used in conjunction with other modalities. You can use nonsteroidal anti-inflammatories like ibuprofen , hot and cold compresses. Rest is recommended in the next 24 hours. You need to report immediately  if fever, chills or any signs of infection develop.

## 2017-04-19 ENCOUNTER — Encounter: Payer: Self-pay | Admitting: *Deleted

## 2017-04-19 DIAGNOSIS — M5126 Other intervertebral disc displacement, lumbar region: Secondary | ICD-10-CM | POA: Diagnosis not present

## 2017-04-19 DIAGNOSIS — M5416 Radiculopathy, lumbar region: Secondary | ICD-10-CM | POA: Diagnosis not present

## 2017-04-19 DIAGNOSIS — M5136 Other intervertebral disc degeneration, lumbar region: Secondary | ICD-10-CM | POA: Diagnosis not present

## 2017-05-03 ENCOUNTER — Encounter (HOSPITAL_COMMUNITY): Payer: Medicare Other | Admitting: Cardiology

## 2017-05-17 DIAGNOSIS — I10 Essential (primary) hypertension: Secondary | ICD-10-CM | POA: Diagnosis not present

## 2017-05-17 DIAGNOSIS — Z6827 Body mass index (BMI) 27.0-27.9, adult: Secondary | ICD-10-CM | POA: Diagnosis not present

## 2017-05-17 DIAGNOSIS — M5416 Radiculopathy, lumbar region: Secondary | ICD-10-CM | POA: Diagnosis not present

## 2017-05-17 DIAGNOSIS — S22080A Wedge compression fracture of T11-T12 vertebra, initial encounter for closed fracture: Secondary | ICD-10-CM | POA: Diagnosis not present

## 2017-05-20 DIAGNOSIS — M5136 Other intervertebral disc degeneration, lumbar region: Secondary | ICD-10-CM | POA: Diagnosis not present

## 2017-05-20 DIAGNOSIS — M48062 Spinal stenosis, lumbar region with neurogenic claudication: Secondary | ICD-10-CM | POA: Diagnosis not present

## 2017-05-20 DIAGNOSIS — M4726 Other spondylosis with radiculopathy, lumbar region: Secondary | ICD-10-CM | POA: Diagnosis not present

## 2017-05-23 ENCOUNTER — Ambulatory Visit (INDEPENDENT_AMBULATORY_CARE_PROVIDER_SITE_OTHER): Payer: Medicare Other | Admitting: Internal Medicine

## 2017-05-23 ENCOUNTER — Encounter: Payer: Self-pay | Admitting: Internal Medicine

## 2017-05-23 VITALS — BP 164/76 | HR 60 | Ht 69.5 in | Wt 201.2 lb

## 2017-05-23 DIAGNOSIS — Z800 Family history of malignant neoplasm of digestive organs: Secondary | ICD-10-CM | POA: Diagnosis not present

## 2017-05-23 DIAGNOSIS — Z86010 Personal history of colonic polyps: Secondary | ICD-10-CM | POA: Diagnosis not present

## 2017-05-23 MED ORDER — SUPREP BOWEL PREP KIT 17.5-3.13-1.6 GM/177ML PO SOLN: 1.0000 | ORAL | 0 refills | Status: DC

## 2017-05-23 NOTE — Patient Instructions (Signed)
You have been scheduled for a colonoscopy. Please follow written instructions given to you at your visit today.  Please pick up your prep supplies at the pharmacy within the next 1-3 days. If you use inhalers (even only as needed), please bring them with you on the day of your procedure. Your physician has requested that you go to www.startemmi.com and enter the access code given to you at your visit today. This web site gives a general overview about your procedure. However, you should still follow specific instructions given to you by our office regarding your preparation for the procedure.  If you are age 14 or older, your body mass index should be between 23-30. Your Body mass index is 29.29 kg/m. If this is out of the aforementioned range listed, please consider follow up with your Primary Care Provider.  If you are age 76 or younger, your body mass index should be between 19-25. Your Body mass index is 29.29 kg/m. If this is out of the aformentioned range listed, please consider follow up with your Primary Care Provider.

## 2017-05-23 NOTE — Progress Notes (Signed)
Patient ID: [name] [name], male   DOB: 1941-04-20, 76 y.o.   MRN: [id number] HPI: [name] [name] is a 76 year old male with a past medical history of colon polyps, hemorrhoids status post surgical resection and family history of colon cancer in his mother in her 78s who is seen to consider repeat screening/surveillance colonoscopy. He also has a history of mild aortic stenosis, hypertension, hypercholesterolemia and atrial fibrillation. He is not on anticoagulation or antiplatelet therapy.  His last colonoscopy was in 2008 which was normal by Dr. Sharlett Iles other than internal hemorrhoids. He subsequently had hemorrhoid resection.  Today he denies complaint. No abdominal pain, nausea, vomiting, change in appetite, unexpected weight loss. No blood in his stool or melena. Denies diarrhea and constipation.  Past Medical History:  Diagnosis Date  . Adenomatous colon polyp   . Aortic stenosis, mild    Echo 07/11: 55-60%, mild LV hypertrophy, mild aortic stenosis w/mean gradient 89mmHg and peak gradient 36 mmHg.  . Ascending aortic aneurysm ([place])   . Bicuspid aortic valve   . Chest pain    ETT-myoview 12/11 w/exercise, no chest pain, no significant ST changes, EF 69%, no evidence for ischemia or infarction.  . CHF NYHA class I (no symptoms from ordinary activities), acute, diastolic ([place])   . Hemorrhoids   . HTN (hypertension)   . Hypercholesteremia   . LBP (low back pain)   . Moderate aortic stenosis   . Osteoarthritis   . Paroxysmal atrial fibrillation ([place])    a. new onset Afib in 07/2008. He underwent ibutilide cardioversion successfully. b. Recurrence 01/2013 s/p TEE/DCCV - was on Xarelto but he stopped it as he was convinced it was causing joint pn. c. Recurrence 01/2016 - spont conv to NSR. Pt took Eliquis x1 mo then declined further anticoag. d. Recurrence 07/2016.  [name] Pneumonia     Past Surgical History:  Procedure Laterality Date  . BACK SURGERY  x12 years ago  . CARDIOVERSION N/A  01/26/2013   Procedure: CARDIOVERSION;  Surgeon: Larey Dresser, MD;  Location: [facility];  Service: Cardiovascular;  Laterality: N/A;  . HEMORRHOID SURGERY    . LUMBAR LAMINECTOMY    . TEE WITHOUT CARDIOVERSION N/A 01/26/2013   Procedure: TRANSESOPHAGEAL ECHOCARDIOGRAM (TEE);  Surgeon: Larey Dresser, MD;  Location: [facility] ENDOSCOPY;  Service: Cardiovascular;  Laterality: N/A;    Outpatient Medications Prior to Visit  Medication Sig Dispense Refill  . Cholecalciferol 1000 UNITS capsule Take 1,000 Units by mouth daily.    . fluticasone (FLONASE) 50 MCG/ACT nasal spray Place 2 sprays into both nostrils daily. 16 g 5  . hydrochlorothiazide (HYDRODIURIL) 12.5 MG tablet Take 12.5 mg by mouth daily.    [name] loratadine (CLARITIN) 10 MG tablet Take 1 tablet (10 mg total) by mouth daily. 30 tablet 5  . metoprolol succinate (TOPROL-XL) 50 MG 24 hr tablet TAKE 1 TABLET (50 MG TOTAL) BY MOUTH 2 (TWO) TIMES DAILY. TAKE WITH OR IMMEDIATELY FOLLOWING A MEAL. 180 tablet 3  . aspirin 81 MG chewable tablet Chew 81 mg by mouth daily.     No facility-administered medications prior to visit.     Allergies  Allergen Reactions  . Xarelto [Rivaroxaban] Other (See Comments)    INCREASED BP-HYPERTENSIVE EVENTS  . Benazepril Cough    Family History  Problem Relation Age of Onset  . Colon cancer Mother 23  . Hypertension Other   . Coronary artery disease Neg Hx     Social History  Substance Use Topics  . Smoking status:  Never Smoker  . Smokeless tobacco: Never Used  . Alcohol use 0.0 oz/week     Comment: Drinks 1 glass of wine nightly/socially    ROS: As per history of present illness, otherwise negative  BP (!) 164/76 (BP Location: Left Arm, Patient Position: Sitting, Cuff Size: Normal)   Pulse 60   Ht 5' 9.5" (1.765 m) Comment: height measured without shoes  Wt 201 lb 4 oz (91.3 kg)   BMI 29.29 kg/m  Constitutional: Well-developed and well-nourished. No distress. HEENT: Normocephalic and  atraumatic. Oropharynx is clear and moist. Conjunctivae are normal.  No scleral icterus. Neck: Neck supple. Trachea midline. Cardiovascular: Normal rate, regular rhythm and intact distal pulses.  Pulmonary/chest: Effort normal and breath sounds normal. No wheezing, rales or rhonchi. Abdominal: Soft, nontender, nondistended. Bowel sounds active throughout. There are no masses palpable. No hepatosplenomegaly. Extremities: no clubbing, cyanosis, or edema Neurological: Alert and oriented to person place and time. Skin: Skin is warm and dry.  Psychiatric: Normal mood and affect. Behavior is normal.  RELEVANT LABS AND IMAGING: CBC    Component Value Date/Time   WBC 5.0 11/25/2016 1402   RBC 4.30 11/25/2016 1402   HGB 13.1 11/25/2016 1402   HCT 39.4 11/25/2016 1402   PLT 233.0 11/25/2016 1402   MCV 91.5 11/25/2016 1402   MCH 31.2 10/28/2016 0524   MCHC 33.4 11/25/2016 1402   RDW 13.6 11/25/2016 1402   LYMPHSABS 1.4 11/25/2016 1402   MONOABS 0.5 11/25/2016 1402   EOSABS 0.2 11/25/2016 1402   BASOSABS 0.0 11/25/2016 1402    CMP     Component Value Date/Time   NA 139 11/25/2016 1402   K 3.6 11/25/2016 1402   CL 103 11/25/2016 1402   CO2 30 11/25/2016 1402   GLUCOSE 97 11/25/2016 1402   BUN 18 11/25/2016 1402   CREATININE 0.91 02/17/2017 1615   CREATININE 0.81 09/10/2016 1101   CALCIUM 9.3 11/25/2016 1402   PROT 7.6 11/25/2016 1402   ALBUMIN 3.8 11/25/2016 1402   AST 19 11/25/2016 1402   ALT 18 11/25/2016 1402   ALKPHOS 94 11/25/2016 1402   BILITOT 0.4 11/25/2016 1402   GFRNONAA >60 02/17/2017 1615   GFRAA >60 02/17/2017 1615    ASSESSMENT/PLAN: 76 year old male with a past medical history of colon polyps, hemorrhoids status post surgical resection and family history of colon cancer in his mother in her 53s who is seen to consider repeat screening/surveillance colonoscopy.  1. Personal history of colon polyps/family history of colon cancer -- last colonoscopy 10 years ago.  I recommended repeat screening/surveillance colonoscopy at this time. We discussed the risk, benefits and alternatives and he is agreeable and wishes to proceed.   NG:[id number], [name] [name], [place],  [id number]

## 2017-05-25 ENCOUNTER — Telehealth: Payer: Self-pay | Admitting: *Deleted

## 2017-05-25 NOTE — Telephone Encounter (Signed)
-----   Message from Melvenia Beam, MD sent at 05/21/2017  3:53 PM EDT ----- Katharine Look we discussed repeating his MRI to follow the effusions. Would you call him and ensure he is still willing? Thanks!   ----- Message ----- From: Melvenia Beam, MD Sent: 05/08/2017 To: Melvenia Beam, MD  Repeat MRI brain for mastoid effusoions

## 2017-05-25 NOTE — Telephone Encounter (Signed)
I called pt.  I relayed that per last ofv note that Dr. Jaynee Eagles had discussed repeating the MRI to f/u on effusions.  Pt stated he will hold off on this, as he has a lot of other medical problems going on, like back pain (seeing Dr. Vertell Limber, and also pneumonia that has lingered on).  I told him to call us back when he was ready to move forward.  He verbalized understanding.

## 2017-05-25 NOTE — Telephone Encounter (Signed)
See previous note

## 2017-06-23 ENCOUNTER — Encounter: Payer: Medicare Other | Admitting: Internal Medicine

## 2017-07-08 DIAGNOSIS — M5136 Other intervertebral disc degeneration, lumbar region: Secondary | ICD-10-CM | POA: Diagnosis not present

## 2017-07-08 DIAGNOSIS — M5137 Other intervertebral disc degeneration, lumbosacral region: Secondary | ICD-10-CM | POA: Diagnosis not present

## 2017-07-08 DIAGNOSIS — M9904 Segmental and somatic dysfunction of sacral region: Secondary | ICD-10-CM | POA: Diagnosis not present

## 2017-07-08 DIAGNOSIS — M9902 Segmental and somatic dysfunction of thoracic region: Secondary | ICD-10-CM | POA: Diagnosis not present

## 2017-07-08 DIAGNOSIS — M9903 Segmental and somatic dysfunction of lumbar region: Secondary | ICD-10-CM | POA: Diagnosis not present

## 2017-07-08 DIAGNOSIS — M5135 Other intervertebral disc degeneration, thoracolumbar region: Secondary | ICD-10-CM | POA: Diagnosis not present

## 2017-07-11 DIAGNOSIS — M5136 Other intervertebral disc degeneration, lumbar region: Secondary | ICD-10-CM | POA: Diagnosis not present

## 2017-07-11 DIAGNOSIS — M9902 Segmental and somatic dysfunction of thoracic region: Secondary | ICD-10-CM | POA: Diagnosis not present

## 2017-07-11 DIAGNOSIS — M5137 Other intervertebral disc degeneration, lumbosacral region: Secondary | ICD-10-CM | POA: Diagnosis not present

## 2017-07-11 DIAGNOSIS — M5135 Other intervertebral disc degeneration, thoracolumbar region: Secondary | ICD-10-CM | POA: Diagnosis not present

## 2017-07-11 DIAGNOSIS — M9903 Segmental and somatic dysfunction of lumbar region: Secondary | ICD-10-CM | POA: Diagnosis not present

## 2017-07-11 DIAGNOSIS — M9904 Segmental and somatic dysfunction of sacral region: Secondary | ICD-10-CM | POA: Diagnosis not present

## 2017-07-12 ENCOUNTER — Ambulatory Visit (INDEPENDENT_AMBULATORY_CARE_PROVIDER_SITE_OTHER): Payer: Medicare Other | Admitting: Internal Medicine

## 2017-07-12 ENCOUNTER — Encounter: Payer: Self-pay | Admitting: Internal Medicine

## 2017-07-12 ENCOUNTER — Ambulatory Visit (INDEPENDENT_AMBULATORY_CARE_PROVIDER_SITE_OTHER)
Admission: RE | Admit: 2017-07-12 | Discharge: 2017-07-12 | Disposition: A | Discharge status: Home / Self Care | Payer: Medicare Other | Source: Ambulatory Visit | Attending: Internal Medicine | Admitting: Internal Medicine

## 2017-07-12 VITALS — BP 156/86 | HR 75 | Temp 97.5°F | Ht 69.5 in | Wt 197.0 lb

## 2017-07-12 DIAGNOSIS — J181 Lobar pneumonia, unspecified organism: Secondary | ICD-10-CM | POA: Diagnosis not present

## 2017-07-12 DIAGNOSIS — M545 Low back pain, unspecified: Secondary | ICD-10-CM

## 2017-07-12 DIAGNOSIS — M79604 Pain in right leg: Secondary | ICD-10-CM | POA: Diagnosis not present

## 2017-07-12 DIAGNOSIS — J189 Pneumonia, unspecified organism: Secondary | ICD-10-CM

## 2017-07-12 DIAGNOSIS — M79606 Pain in leg, unspecified: Secondary | ICD-10-CM | POA: Insufficient documentation

## 2017-07-12 DIAGNOSIS — I10 Essential (primary) hypertension: Secondary | ICD-10-CM

## 2017-07-12 DIAGNOSIS — G8929 Other chronic pain: Secondary | ICD-10-CM

## 2017-07-12 DIAGNOSIS — I480 Paroxysmal atrial fibrillation: Secondary | ICD-10-CM

## 2017-07-12 DIAGNOSIS — Z23 Encounter for immunization: Secondary | ICD-10-CM | POA: Diagnosis not present

## 2017-07-12 MED ORDER — VITAMIN D3 50 MCG (2000 UT) PO CAPS: 2000.0000 [IU] | ORAL_CAPSULE | Freq: Every day | ORAL | 3 refills | Status: DC

## 2017-07-12 MED ORDER — PREDNISONE 10 MG PO TABS: ORAL_TABLET | ORAL | 1 refills | Status: DC

## 2017-07-12 NOTE — Assessment & Plan Note (Signed)
The pt saw Dr Vertell Limber and had an injection. Le sx's on the R are not better.Marland KitchenMarland Kitchen

## 2017-07-12 NOTE — Assessment & Plan Note (Signed)
CXR

## 2017-07-12 NOTE — Assessment & Plan Note (Signed)
Eliquis, Toprol

## 2017-07-12 NOTE — Assessment & Plan Note (Signed)
Metoprolol, Hydrodiuril 

## 2017-07-12 NOTE — Progress Notes (Signed)
Subjective:  Patient ID: [name] [name], male    DOB: 1941/07/24  Age: 76 y.o. MRN: [id number]  CC: No chief complaint on file.   HPI Chip Leisure presents for R LE pain (Dr Vertell Limber). F/u A fib, HTN, compression fx. C/o R leg weakness, pain and tingling... F/u CAP  Outpatient Medications Prior to Visit  Medication Sig Dispense Refill  . Cholecalciferol 1000 UNITS capsule Take 1,000 Units by mouth daily.    . fluticasone (FLONASE) 50 MCG/ACT nasal spray Place 2 sprays into both nostrils daily. 16 g 5  . hydrochlorothiazide (HYDRODIURIL) 12.5 MG tablet Take 12.5 mg by mouth daily.    [name] ibuprofen (ADVIL,MOTRIN) 200 MG tablet Take 200 mg by mouth as needed.    . loratadine (CLARITIN) 10 MG tablet Take 1 tablet (10 mg total) by mouth daily. 30 tablet 5  . metoprolol succinate (TOPROL-XL) 50 MG 24 hr tablet TAKE 1 TABLET (50 MG TOTAL) BY MOUTH 2 (TWO) TIMES DAILY. TAKE WITH OR IMMEDIATELY FOLLOWING A MEAL. 180 tablet 3  . SUPREP BOWEL PREP KIT 17.5-3.13-1.6 GM/180ML SOLN Take 1 kit by mouth as directed. 354 mL 0   No facility-administered medications prior to visit.     ROS Review of Systems  Constitutional: Negative for appetite change, fatigue and unexpected weight change.  HENT: Negative for congestion, nosebleeds, sneezing, sore throat and trouble swallowing.   Eyes: Negative for itching and visual disturbance.  Respiratory: Negative for cough.   Cardiovascular: Negative for chest pain, palpitations and leg swelling.  Gastrointestinal: Negative for abdominal distention, blood in stool, diarrhea and nausea.  Genitourinary: Negative for frequency and hematuria.  Musculoskeletal: Positive for arthralgias, back pain and gait problem. Negative for joint swelling and neck pain.  Skin: Negative for rash.  Neurological: Negative for dizziness, tremors, speech difficulty and weakness.  Psychiatric/Behavioral: Negative for agitation, dysphoric mood and sleep disturbance. The patient is not  nervous/anxious.     Objective:  BP (!) 156/86 (BP Location: Left Arm, Patient Position: Sitting, Cuff Size: Normal)   Pulse 75   Temp (!) 97.5 F (36.4 C) (Oral)   Ht 5' 9.5" (1.765 m)   Wt 197 lb (89.4 kg)   SpO2 99%   BMI 28.67 kg/m   BP Readings from Last 3 Encounters:  07/12/17 (!) 156/86  05/23/17 (!) 164/76  03/21/17 (!) 160/92    Wt Readings from Last 3 Encounters:  07/12/17 197 lb (89.4 kg)  05/23/17 201 lb 4 oz (91.3 kg)  03/21/17 193 lb (87.5 kg)    Physical Exam  Constitutional: He is oriented to person, place, and time. He appears well-developed. No distress.  NAD  HENT:  Mouth/Throat: Oropharynx is clear and moist.  Eyes: Pupils are equal, round, and reactive to light. Conjunctivae are normal.  Neck: Normal range of motion. No JVD present. No thyromegaly present.  Cardiovascular: Normal rate, regular rhythm, normal heart sounds and intact distal pulses.  Exam reveals no gallop and no friction rub.   No murmur heard. Pulmonary/Chest: Effort normal and breath sounds normal. No respiratory distress. He has no wheezes. He has no rales. He exhibits no tenderness.  Abdominal: Soft. Bowel sounds are normal. He exhibits no distension and no mass. There is no tenderness. There is no rebound and no guarding.  Musculoskeletal: Normal range of motion. He exhibits tenderness. He exhibits no edema.  Lymphadenopathy:    He has no cervical adenopathy.  Neurological: He is alert and oriented to person, place, and time. He has normal  reflexes. No cranial nerve deficit. He exhibits normal muscle tone. He displays a negative Romberg sign. Coordination and gait normal.  Skin: Skin is warm and dry. No rash noted.  Psychiatric: He has a normal mood and affect. His behavior is normal. Judgment and thought content normal.  R buttock is tender str leg elev (-) B R knee flexor is 5-/5 No muscle atrophy  Lab Results  Component Value Date   WBC 5.0 11/25/2016   HGB 13.1 11/25/2016    HCT 39.4 11/25/2016   PLT 233.0 11/25/2016   GLUCOSE 97 11/25/2016   CHOL 123 08/03/2016   TRIG 142 08/03/2016   HDL 24 (L) 08/03/2016   LDLDIRECT 151.5 11/20/2007   LDLCALC 71 08/03/2016   ALT 18 11/25/2016   AST 19 11/25/2016   NA 139 11/25/2016   K 3.6 11/25/2016   CL 103 11/25/2016   CREATININE 0.91 02/17/2017   BUN 18 11/25/2016   CO2 30 11/25/2016   TSH 4.26 12/23/2016   PSA 2.76 03/04/2015   INR 1.14 08/02/2016   HGBA1C 5.7 (H) 08/02/2016    Mr Brain W Wo Contrast  Addendum Date: 02/18/2017   ADDENDUM REPORT: 02/18/2017 12:42 ADDENDUM: Study discussed by telephone with Dr. Minna Merritts on 02/18/2017 at 12:42 . Electronically Signed   By: Genevie Ann M.D.   On: 02/18/2017 12:42   Result Date: 02/18/2017 CLINICAL DATA:  76 year old male with bilateral hearing loss. Vertigo. Pressure in both ears. Symptoms began following hospitalization for pneumonia this February. EXAM: MRI HEAD WITHOUT AND WITH CONTRAST TECHNIQUE: Multiplanar, multiecho pulse sequences of the brain and surrounding structures were obtained without and with intravenous contrast. CONTRAST:  41m MULTIHANCE GADOBENATE DIMEGLUMINE 529 MG/ML IV SOLN COMPARISON:  None. FINDINGS: Brain: Cerebral volume is within normal limits for age. No restricted diffusion to suggest acute infarction. No midline shift, mass effect, evidence of mass lesion, ventriculomegaly, extra-axial collection or acute intracranial hemorrhage. Cervicomedullary junction and pituitary are within normal limits. No cortical encephalomalacia or chronic cerebral blood products. GPearline Cablesand white matter signal is within normal limits for age throughout the brain. No abnormal enhancement identified. No dural thickening. Vascular: Major intracranial vascular flow voids are preserved. Skull and upper cervical spine: Bone marrow signal remains normal at the skullbase. Negative visualized cervical spine. Calvarium intact. Sinuses/Orbits: Normal orbit soft tissues. Normal  cavernous sinus. Paranasal sinuses are clear. Negative scalp soft tissues. Other: Abnormal left mastoid air cells. Confluent opacification of the posterior and inferior air cells which are abnormally enhancing (series 10, image 17 versus series 16, image 11) and demonstrate suspicious diffusion changes visible on coronal DO view i.e. (series 5, image 13). Despite this the left mastoid antrum may remain pneumatized, and there is no definite left tympanic cavity fluid or opacification. No overlying dural thickening along the inferior left temporal lobe. No bone destruction or ir extension into the left periauricular soft tissues. Also, the left transverse and sigmoid sinuses remain patent. The left cerebellopontine angle, left cisternal and intracanalicular 7th and 8th cranial nerve segments, left cochlea and vestibular structures remain normal. Contralateral right mastoids also demonstrates some fluid signal, but this is not confluent and there is no definite enhancement. Right cerebellopontine angle, right cisternal and intracanalicular 7th and 8th cranial nerve segments, and cochlea and vestibular structures remain normal. No abnormal middle ear enhancement. Normal styloid mastoid foramina and visible parotid glands. IMPRESSION: 1. Complicated appearing left mastoid effusion suspicious for infectious mastoiditis, although appears to spare the left mastoid antrum and tympanic cavity.  Also, no other complicating features (no dural or intracranial involvement, and no peri-auricular soft tissue extension). 2. Smaller and simple appearing right mastoid effusion. 3. No other IAC abnormality. 4. Normal for age MRI appearance of the brain. Electronically Signed: By: Genevie Ann M.D. On: 02/18/2017 07:49    Assessment & Plan:   There are no diagnoses linked to this encounter. I am having Mr. Wotton maintain his Cholecalciferol, hydrochlorothiazide, metoprolol succinate, loratadine, fluticasone, ibuprofen, and SUPREP BOWEL  PREP KIT.  No orders of the defined types were placed in this encounter.    Follow-up: No Follow-up on file.  [name], MD

## 2017-07-12 NOTE — Patient Instructions (Addendum)
Vitamin B complex 1 a day    Piriformis Syndrome Rehab Ask your health care provider which exercises are safe for you. Do exercises exactly as told by your health care provider and adjust them as directed. It is normal to feel mild stretching, pulling, tightness, or discomfort as you do these exercises, but you should stop right away if you feel sudden pain or your pain gets worse.Do not begin these exercises until told by your health care provider. Stretching and range of motion exercises These exercises warm up your muscles and joints and improve the movement and flexibility of your hip and pelvis. These exercises also help to relieve pain, numbness, and tingling. Exercise A: Hip rotators  1. Lie on your back on a firm surface. 2. Pull your left / right knee toward your same shoulder with your left / right hand until your knee is pointing toward the ceiling. Hold your left / right ankle with your other hand. 3. Keeping your knee steady, gently pull your left / right ankle toward your other shoulder until you feel a stretch in your buttocks. 4. Hold this position for __________ seconds. Repeat __________ times. Complete this stretch __________ times a day. Exercise B: Hip extensors 1. Lie on your back on a firm surface. Both of your legs should be straight. 2. Pull your left / right knee to your chest. Hold your leg in this position by holding onto the back of your thigh or the front of your knee. 3. Hold this position for __________ seconds. 4. Slowly return to the starting position. Repeat __________ times. Complete this stretch __________ times a day. Strengthening exercises These exercises build strength and endurance in your hip and thigh muscles. Endurance is the ability to use your muscles for a long time, even after they get tired. Exercise C: Straight leg raises ( hip abductors) 1. Lie on your side with your left / right leg in the top position. Lie so your head, shoulder, knee,  and hip line up. Bend your bottom knee to help you balance. 2. Lift your top leg up 4-6 inches (10-15 cm), keeping your toes pointed straight ahead. 3. Hold this position for __________ seconds. 4. Slowly lower your leg to the starting position. Let your muscles relax completely. Repeat __________ times. Complete this exercise__________ times a day. Exercise D: Hip abductors and rotators, quadruped  1. Get on your hands and knees on a firm, lightly padded surface. Your hands should be directly below your shoulders, and your knees should be directly below your hips. 2. Lift your left / right knee out to the side. Keep your knee bent. Do not twist your body. 3. Hold this position for __________ seconds. 4. Slowly lower your leg. Repeat __________ times. Complete this exercise__________ times a day. Exercise E: Straight leg raises ( hip extensors) 1. Lie on your abdomen on a bed or a firm surface with a pillow under your hips. 2. Squeeze your buttock muscles and lift your left / right thigh off the bed. Do not let your back arch. 3. Hold this position for __________ seconds. 4. Slowly return to the starting position. Let your muscles relax completely before doing another repetition. Repeat __________ times. Complete this exercise__________ times a day. This information is not intended to replace advice given to you by your health care provider. Make sure you discuss any questions you have with your health care provider. Document Released: 08/30/2005 Document Revised: 05/04/2016 Document Reviewed: 08/12/2015 Elsevier Interactive Patient Education  2018 [employer] Piriformis Syndrome Piriformis syndrome is a condition that can cause pain and numbness in your buttocks and down the back of your leg. Piriformis syndrome happens when the small muscle that connects the base of your spine to your hip (piriformis muscle) presses on the nerve that runs down the back of your leg (sciatic nerve). The  piriformis muscle helps your hip rotate and helps to bring your leg back and out. It also helps shift your weight while you are walking to keep you stable. The sciatic nerve runs under or through the piriformis. Damage to the piriformis muscle can cause spasms that put pressure on the nerve below. This causes pain and discomfort while sitting and moving. The pain may feel as if it begins in the buttock and spreads (radiates) down your hip and thigh. What are the causes? This condition is caused by pressure on the sciatic nerve from the piriformis muscle. The piriformis muscle can get irritated with overuse, especially if other hip muscles are weak and the piriformis has to do extra work. Piriformis syndrome can also occur after an injury, like a fall onto your buttocks. What increases the risk? This condition is more likely to develop in:  Women.  People who sit for long periods of time.  Cyclists.  People who have weak buttocks muscles (gluteal muscles).  What are the signs or symptoms? Pain, tingling, or numbness that starts in the buttock and runs down the back of your leg (sciatica) is the most common symptom of this condition. Your symptoms may:  Get worse the longer you sit.  Get worse when you walk, run, or go up on stairs.  How is this diagnosed? This condition is diagnosed based on your symptoms, medical history, and physical exam. During this exam, your health care provider may move your leg into different positions to check for pain. He or she will also press on the muscles of your hip and buttock to see if that increases your symptoms. You may also have an X-ray or MRI. How is this treated? Treatment for this condition may include:  Stopping all activities that cause pain or make your condition worse.  Using heat or ice to relieve pain as told by your health care provider.  Taking medicines to reduce pain and swelling.  Taking a muscle relaxer to release the piriformis  muscle.  Doing range-of-motion and strengthening exercises (physical therapy) as told by your health care provider.  Massaging the affected area.  Getting an injection of an anti-inflammatory medicine or muscle relaxer to reduce inflammation and muscle tension.  In rare cases, you may need surgery to cut the muscle and release pressure on the nerve if other treatments do not work. Follow these instructions at home:  Take over-the-counter and prescription medicines only as told by your health care provider.  Do not sit for long periods. Get up and walk around every 20 minutes or as often as told by your health care provider.  If directed, apply heat to the affected area as often as told by your health care provider. Use the heat source that your health care provider recommends, such as a moist heat pack or a heating pad. ? Place a towel between your skin and the heat source. ? Leave the heat on for 20-30 minutes. ? Remove the heat if your skin turns bright red. This is especially important if you are unable to feel pain, heat, or cold. You may have a greater risk of  getting burned.  If directed, apply ice to the injured area. ? Put ice in a plastic bag. ? Place a towel between your skin and the bag. ? Leave the ice on for 20 minutes, 2-3 times a day.  Do exercises as told by your health care provider.  Return to your normal activities as told by your health care provider. Ask your health care provider what activities are safe for you.  Keep all follow-up visits as told by your health care provider. This is important. How is this prevented?  Do not sit for longer than 20 minutes at a time. When you sit, choose padded surfaces.  Warm up and stretch before being active.  Cool down and stretch after being active.  Give your body time to rest between periods of activity.  Make sure to use equipment that fits you.  Maintain physical fitness,  including: ? Strength. ? Flexibility. Contact a health care provider if:  Your pain and stiffness continue or get worse.  Your leg or hip becomes weak.  You have changes in your bowel function or bladder function. This information is not intended to replace advice given to you by your health care provider. Make sure you discuss any questions you have with your health care provider. Document Released: 08/30/2005 Document Revised: 05/04/2016 Document Reviewed: 08/12/2015 Elsevier Interactive Patient Education  [employer].

## 2017-07-12 NOTE — Addendum Note (Signed)
Addended by: Karren Cobble on: 07/12/2017 11:50 AM   Modules accepted: Orders

## 2017-07-13 DIAGNOSIS — M5135 Other intervertebral disc degeneration, thoracolumbar region: Secondary | ICD-10-CM | POA: Diagnosis not present

## 2017-07-13 DIAGNOSIS — M9903 Segmental and somatic dysfunction of lumbar region: Secondary | ICD-10-CM | POA: Diagnosis not present

## 2017-07-13 DIAGNOSIS — M5137 Other intervertebral disc degeneration, lumbosacral region: Secondary | ICD-10-CM | POA: Diagnosis not present

## 2017-07-13 DIAGNOSIS — M9904 Segmental and somatic dysfunction of sacral region: Secondary | ICD-10-CM | POA: Diagnosis not present

## 2017-07-13 DIAGNOSIS — M9902 Segmental and somatic dysfunction of thoracic region: Secondary | ICD-10-CM | POA: Diagnosis not present

## 2017-07-13 DIAGNOSIS — M5136 Other intervertebral disc degeneration, lumbar region: Secondary | ICD-10-CM | POA: Diagnosis not present

## 2017-07-19 DIAGNOSIS — R262 Difficulty in walking, not elsewhere classified: Secondary | ICD-10-CM | POA: Diagnosis not present

## 2017-07-19 DIAGNOSIS — R293 Abnormal posture: Secondary | ICD-10-CM | POA: Diagnosis not present

## 2017-07-19 DIAGNOSIS — M5431 Sciatica, right side: Secondary | ICD-10-CM | POA: Diagnosis not present

## 2017-07-19 DIAGNOSIS — M79604 Pain in right leg: Secondary | ICD-10-CM | POA: Diagnosis not present

## 2017-08-02 DIAGNOSIS — M79604 Pain in right leg: Secondary | ICD-10-CM | POA: Diagnosis not present

## 2017-08-16 DIAGNOSIS — M79604 Pain in right leg: Secondary | ICD-10-CM | POA: Diagnosis not present

## 2017-08-18 DIAGNOSIS — R2689 Other abnormalities of gait and mobility: Secondary | ICD-10-CM | POA: Diagnosis not present

## 2017-08-18 DIAGNOSIS — M545 Low back pain: Secondary | ICD-10-CM | POA: Diagnosis not present

## 2017-08-18 DIAGNOSIS — M79604 Pain in right leg: Secondary | ICD-10-CM | POA: Diagnosis not present

## 2017-08-25 DIAGNOSIS — M545 Low back pain: Secondary | ICD-10-CM | POA: Diagnosis not present

## 2017-08-25 DIAGNOSIS — M79604 Pain in right leg: Secondary | ICD-10-CM | POA: Diagnosis not present

## 2017-08-25 DIAGNOSIS — R2689 Other abnormalities of gait and mobility: Secondary | ICD-10-CM | POA: Diagnosis not present

## 2017-08-31 DIAGNOSIS — R2689 Other abnormalities of gait and mobility: Secondary | ICD-10-CM | POA: Diagnosis not present

## 2017-08-31 DIAGNOSIS — M79604 Pain in right leg: Secondary | ICD-10-CM | POA: Diagnosis not present

## 2017-08-31 DIAGNOSIS — M545 Low back pain: Secondary | ICD-10-CM | POA: Diagnosis not present

## 2017-09-01 DIAGNOSIS — G629 Polyneuropathy, unspecified: Secondary | ICD-10-CM | POA: Diagnosis not present

## 2017-09-02 DIAGNOSIS — R2689 Other abnormalities of gait and mobility: Secondary | ICD-10-CM | POA: Diagnosis not present

## 2017-09-02 DIAGNOSIS — M545 Low back pain: Secondary | ICD-10-CM | POA: Diagnosis not present

## 2017-09-02 DIAGNOSIS — M79604 Pain in right leg: Secondary | ICD-10-CM | POA: Diagnosis not present

## 2017-09-08 DIAGNOSIS — M79604 Pain in right leg: Secondary | ICD-10-CM | POA: Diagnosis not present

## 2017-09-08 DIAGNOSIS — M545 Low back pain: Secondary | ICD-10-CM | POA: Diagnosis not present

## 2017-09-08 DIAGNOSIS — R2689 Other abnormalities of gait and mobility: Secondary | ICD-10-CM | POA: Diagnosis not present

## 2017-09-14 DIAGNOSIS — R2689 Other abnormalities of gait and mobility: Secondary | ICD-10-CM | POA: Diagnosis not present

## 2017-09-14 DIAGNOSIS — M79604 Pain in right leg: Secondary | ICD-10-CM | POA: Diagnosis not present

## 2017-09-14 DIAGNOSIS — M545 Low back pain: Secondary | ICD-10-CM | POA: Diagnosis not present

## 2017-09-19 DIAGNOSIS — M545 Low back pain: Secondary | ICD-10-CM | POA: Diagnosis not present

## 2017-09-19 DIAGNOSIS — R2689 Other abnormalities of gait and mobility: Secondary | ICD-10-CM | POA: Diagnosis not present

## 2017-09-19 DIAGNOSIS — M79604 Pain in right leg: Secondary | ICD-10-CM | POA: Diagnosis not present

## 2017-09-26 DIAGNOSIS — M545 Low back pain: Secondary | ICD-10-CM | POA: Diagnosis not present

## 2017-09-26 DIAGNOSIS — G6281 Critical illness polyneuropathy: Secondary | ICD-10-CM | POA: Diagnosis not present

## 2017-09-26 DIAGNOSIS — G8929 Other chronic pain: Secondary | ICD-10-CM | POA: Insufficient documentation

## 2017-10-07 ENCOUNTER — Other Ambulatory Visit: Payer: Self-pay

## 2017-10-07 ENCOUNTER — Ambulatory Visit (AMBULATORY_SURGERY_CENTER): Payer: Self-pay | Admitting: *Deleted

## 2017-10-07 VITALS — Ht 70.0 in | Wt 204.0 lb

## 2017-10-07 DIAGNOSIS — Z86010 Personal history of colonic polyps: Secondary | ICD-10-CM

## 2017-10-07 DIAGNOSIS — Z800 Family history of malignant neoplasm of digestive organs: Secondary | ICD-10-CM

## 2017-10-07 NOTE — Progress Notes (Signed)
No egg or soy allergy known to patient  No issues with past sedation with any surgeries  or procedures, no intubation problems  No diet pills per patient No home 02 use per patient  No blood thinners per patient  Pt denies issues with constipation in past but no longer has it. Hx of  A fib no longer on Anticoag. EMMI video sent to pt's e mail pt. declined

## 2017-10-10 ENCOUNTER — Ambulatory Visit: Payer: Medicare Other | Admitting: Internal Medicine

## 2017-10-11 DIAGNOSIS — M79604 Pain in right leg: Secondary | ICD-10-CM | POA: Diagnosis not present

## 2017-10-12 ENCOUNTER — Ambulatory Visit (INDEPENDENT_AMBULATORY_CARE_PROVIDER_SITE_OTHER): Payer: Medicare Other | Admitting: Internal Medicine

## 2017-10-12 ENCOUNTER — Encounter: Payer: Self-pay | Admitting: Internal Medicine

## 2017-10-12 DIAGNOSIS — M79604 Pain in right leg: Secondary | ICD-10-CM | POA: Diagnosis not present

## 2017-10-12 DIAGNOSIS — I10 Essential (primary) hypertension: Secondary | ICD-10-CM | POA: Diagnosis not present

## 2017-10-12 DIAGNOSIS — M7061 Trochanteric bursitis, right hip: Secondary | ICD-10-CM

## 2017-10-12 MED ORDER — HYDROCODONE-ACETAMINOPHEN 7.5-325 MG PO TABS: 0.5000 | ORAL_TABLET | Freq: Four times a day (QID) | ORAL | 0 refills | Status: DC | PRN

## 2017-10-12 NOTE — Patient Instructions (Addendum)
Try TRE - trauma release exercise for pain Dry needling for pain Turmeric pills for pain

## 2017-10-12 NOTE — Assessment & Plan Note (Signed)
Metoprolol, Hydrodiuril 

## 2017-10-12 NOTE — Assessment & Plan Note (Addendum)
Seeing Dr Rolena Infante, Dr Veverly Fells, Dr Nelva Bush - [facility] He saw Dr Domingo Cocking, Dr Vertell Limber - [place]  Try TRE, Turmeric Norco  Potential benefits of  opioids use as well as potential risks (i.e. addiction risk, apnea etc) and complications (i.e. Somnolence, constipation and others) were explained to the patient and were aknowledged.

## 2017-10-12 NOTE — Progress Notes (Signed)
Subjective:  Patient ID: [name] [name], male    DOB: 1941/01/17  Age: 77 y.o. MRN: [id number]  CC: No chief complaint on file.   HPI [name] [name] presents for R back pain and R hip pain - "I'm crippled". My R leg is like "wood"... Seeing Dr Rolena Infante, Dr Veverly Fells, Dr Nelva Bush - [facility] He saw Dr Domingo Cocking, Dr Vertell Limber - [place]  C/o burning pain in the L chest on the ribs  Outpatient Medications Prior to Visit  Medication Sig Dispense Refill  . Cholecalciferol (VITAMIN D3) 2000 units capsule Take 1 capsule (2,000 Units total) by mouth daily. 100 capsule 3  . hydrochlorothiazide (HYDRODIURIL) 12.5 MG tablet Take 12.5 mg by mouth daily.    [name] ibuprofen (ADVIL,MOTRIN) 200 MG tablet Take 200 mg by mouth as needed.    . metoprolol succinate (TOPROL-XL) 50 MG 24 hr tablet TAKE 1 TABLET (50 MG TOTAL) BY MOUTH 2 (TWO) TIMES DAILY. TAKE WITH OR IMMEDIATELY FOLLOWING A MEAL. 180 tablet 3  . SUPREP BOWEL PREP KIT 17.5-3.13-1.6 GM/180ML SOLN Take 1 kit by mouth as directed. 354 mL 0   No facility-administered medications prior to visit.     ROS Review of Systems  Constitutional: Negative for appetite change, fatigue and unexpected weight change.  HENT: Negative for congestion, nosebleeds, sneezing, sore throat and trouble swallowing.   Eyes: Negative for itching and visual disturbance.  Respiratory: Negative for cough.   Cardiovascular: Negative for chest pain, palpitations and leg swelling.  Gastrointestinal: Negative for abdominal distention, blood in stool, diarrhea and nausea.  Genitourinary: Negative for frequency and hematuria.  Musculoskeletal: Positive for arthralgias, back pain and gait problem. Negative for joint swelling and neck pain.  Skin: Negative for rash.  Neurological: Negative for dizziness, tremors, speech difficulty and weakness.  Psychiatric/Behavioral: Negative for agitation, dysphoric mood and sleep disturbance. The patient is not nervous/anxious.     Objective:  BP (!) 162/84  (BP Location: Left Arm, Patient Position: Sitting, Cuff Size: Large)   Pulse (!) 57   Temp 98.2 F (36.8 C) (Oral)   Ht '5\' 10"'$  (1.778 m)   Wt 203 lb (92.1 kg)   SpO2 99%   BMI 29.13 kg/m   BP Readings from Last 3 Encounters:  10/12/17 (!) 162/84  07/12/17 (!) 156/86  05/23/17 (!) 164/76    Wt Readings from Last 3 Encounters:  10/12/17 203 lb (92.1 kg)  10/07/17 204 lb (92.5 kg)  07/12/17 197 lb (89.4 kg)    Physical Exam  Constitutional: He is oriented to person, place, and time. He appears well-developed. No distress.  NAD  HENT:  Mouth/Throat: Oropharynx is clear and moist.  Eyes: Conjunctivae are normal. Pupils are equal, round, and reactive to light.  Neck: Normal range of motion. No JVD present. No thyromegaly present.  Cardiovascular: Normal rate, regular rhythm, normal heart sounds and intact distal pulses. Exam reveals no gallop and no friction rub.  No murmur heard. Pulmonary/Chest: Effort normal and breath sounds normal. No respiratory distress. He has no wheezes. He has no rales. He exhibits no tenderness.  Abdominal: Soft. Bowel sounds are normal. He exhibits no distension and no mass. There is no tenderness. There is no rebound and no guarding.  Musculoskeletal: Normal range of motion. He exhibits tenderness. He exhibits no edema.  Lymphadenopathy:    He has no cervical adenopathy.  Neurological: He is alert and oriented to person, place, and time. He has normal reflexes. No cranial nerve deficit. He exhibits normal muscle tone.  He displays a negative Romberg sign. Coordination and gait normal.  Skin: Skin is warm and dry. No rash noted.  Psychiatric: He has a normal mood and affect. His behavior is normal. Judgment and thought content normal.  R hip tender w/ROM Str leg elev (-) B L chest NT  Lab Results  Component Value Date   WBC 5.0 11/25/2016   HGB 13.1 11/25/2016   HCT 39.4 11/25/2016   PLT 233.0 11/25/2016   GLUCOSE 97 11/25/2016   CHOL 123  08/03/2016   TRIG 142 08/03/2016   HDL 24 (L) 08/03/2016   LDLDIRECT 151.5 11/20/2007   LDLCALC 71 08/03/2016   ALT 18 11/25/2016   AST 19 11/25/2016   NA 139 11/25/2016   K 3.6 11/25/2016   CL 103 11/25/2016   CREATININE 0.91 02/17/2017   BUN 18 11/25/2016   CO2 30 11/25/2016   TSH 4.26 12/23/2016   PSA 2.76 03/04/2015   INR 1.14 08/02/2016   HGBA1C 5.7 (H) 08/02/2016    Dg Chest 2 View  Result Date: 07/12/2017 CLINICAL DATA:  Required pneumonia, followup exam EXAM: CHEST  2 VIEW COMPARISON:  12/21/2016 FINDINGS: Cardiac shadow is mildly enlarged but stable. Previously seen left basilar changes with effusion and left apical changes have resolved in the interval. No acute infiltrate is seen. No bony abnormality is noted. IMPRESSION: Resolution of previously seen left-sided changes as described. Electronically Signed   By: Inez Catalina M.D.   On: 07/12/2017 15:30    Assessment & Plan:   There are no diagnoses linked to this encounter. I am having [name] [name] maintain his hydrochlorothiazide, metoprolol succinate, ibuprofen, SUPREP BOWEL PREP KIT, and Vitamin D3.  No orders of the defined types were placed in this encounter.    Follow-up: No Follow-up on file.  [name], MD

## 2017-10-12 NOTE — Assessment & Plan Note (Signed)
Heat TRE Turmeric po

## 2017-10-21 ENCOUNTER — Ambulatory Visit (AMBULATORY_SURGERY_CENTER): Payer: Medicare Other | Admitting: Internal Medicine

## 2017-10-21 ENCOUNTER — Other Ambulatory Visit: Payer: Self-pay

## 2017-10-21 ENCOUNTER — Encounter: Payer: Self-pay | Admitting: Internal Medicine

## 2017-10-21 VITALS — BP 132/66 | HR 58 | Temp 99.1°F | Resp 18 | Ht 70.0 in | Wt 204.0 lb

## 2017-10-21 DIAGNOSIS — K621 Rectal polyp: Secondary | ICD-10-CM | POA: Diagnosis not present

## 2017-10-21 DIAGNOSIS — D120 Benign neoplasm of cecum: Secondary | ICD-10-CM | POA: Diagnosis not present

## 2017-10-21 DIAGNOSIS — D122 Benign neoplasm of ascending colon: Secondary | ICD-10-CM

## 2017-10-21 DIAGNOSIS — D123 Benign neoplasm of transverse colon: Secondary | ICD-10-CM

## 2017-10-21 DIAGNOSIS — Z800 Family history of malignant neoplasm of digestive organs: Secondary | ICD-10-CM | POA: Diagnosis not present

## 2017-10-21 DIAGNOSIS — I4891 Unspecified atrial fibrillation: Secondary | ICD-10-CM | POA: Diagnosis not present

## 2017-10-21 DIAGNOSIS — E669 Obesity, unspecified: Secondary | ICD-10-CM | POA: Diagnosis not present

## 2017-10-21 DIAGNOSIS — Z86010 Personal history of colonic polyps: Secondary | ICD-10-CM | POA: Diagnosis not present

## 2017-10-21 DIAGNOSIS — I10 Essential (primary) hypertension: Secondary | ICD-10-CM | POA: Diagnosis not present

## 2017-10-21 MED ORDER — SODIUM CHLORIDE 0.9 % IV SOLN: 500.0000 mL | Freq: Once | INTRAVENOUS | Status: DC

## 2017-10-21 NOTE — Patient Instructions (Signed)
Impression/Recommendations:  Polyp handout given to patient. Hemorrhoid handout given to patient.  Resume previous diet. Continue present medications.  Repeat colonoscopy recommended.  Date to be determined after pathology results reviewed.  YOU HAD AN ENDOSCOPIC PROCEDURE TODAY AT [facility]:   Refer to the procedure report that was given to you for any specific questions about what was found during the examination.  If the procedure report does not answer your questions, please call your gastroenterologist to clarify.  If you requested that your care partner not be given the details of your procedure findings, then the procedure report has been included in a sealed envelope for you to review at your convenience later.  YOU SHOULD EXPECT: Some feelings of bloating in the abdomen. Passage of more gas than usual.  Walking can help get rid of the air that was put into your GI tract during the procedure and reduce the bloating. If you had a lower endoscopy (such as a colonoscopy or flexible sigmoidoscopy) you may notice spotting of blood in your stool or on the toilet paper. If you underwent a bowel prep for your procedure, you may not have a normal bowel movement for a few days.  Please Note:  You might notice some irritation and congestion in your nose or some drainage.  This is from the oxygen used during your procedure.  There is no need for concern and it should clear up in a day or so.  SYMPTOMS TO REPORT IMMEDIATELY:   Following lower endoscopy (colonoscopy or flexible sigmoidoscopy):  Excessive amounts of blood in the stool  Significant tenderness or worsening of abdominal pains  Swelling of the abdomen that is new, acute  Fever of 100F or higher  For urgent or emergent issues, a gastroenterologist can be reached at any hour by calling [phone].   DIET:  We do recommend a small meal at first, but then you may proceed to your regular diet.  Drink plenty of  fluids but you should avoid alcoholic beverages for 24 hours.  ACTIVITY:  You should plan to take it easy for the rest of today and you should NOT DRIVE or use heavy machinery until tomorrow (because of the sedation medicines used during the test).    FOLLOW UP: Our staff will call the number listed on your records the next business day following your procedure to check on you and address any questions or concerns that you may have regarding the information given to you following your procedure. If we do not reach you, we will leave a message.  However, if you are feeling well and you are not experiencing any problems, there is no need to return our call.  We will assume that you have returned to your regular daily activities without incident.  If any biopsies were taken you will be contacted by phone or by letter within the next 1-3 weeks.  Please call us at [phone] if you have not heard about the biopsies in 3 weeks.    SIGNATURES/CONFIDENTIALITY: You and/or your care partner have signed paperwork which will be entered into your electronic medical record.  These signatures attest to the fact that that the information above on your After Visit Summary has been reviewed and is understood.  Full responsibility of the confidentiality of this discharge information lies with you and/or your care-partner.

## 2017-10-21 NOTE — Progress Notes (Signed)
I have reviewed the patient's medical history in detail and updated the computerized patient record.

## 2017-10-21 NOTE — Op Note (Signed)
[facility] Patient Name: [name] [name] Procedure Date: 10/21/2017 1:34 PM MRN: [id number] Endoscopist: Jerene Bears , MD Age: 77 Referring MD:  Date of Birth: Jan 12, 1941 Gender: Male Account #: [id number] Procedure:                Colonoscopy Indications:              Screening patient at increased risk: Family history                            of 1st-degree relative with colorectal cancer at                            age 52 years (or older), High risk colon cancer                            surveillance: Personal history of colonic polyps,                            Last colonoscopy 10 years ago Medicines:                Monitored Anesthesia Care Procedure:                Pre-Anesthesia Assessment:                           - Prior to the procedure, a History and Physical                            was performed, and patient medications and                            allergies were reviewed. The patient's tolerance of                            previous anesthesia was also reviewed. The risks                            and benefits of the procedure and the sedation                            options and risks were discussed with the patient.                            All questions were answered, and informed consent                            was obtained. Prior Anticoagulants: The patient has                            taken no previous anticoagulant or antiplatelet                            agents. ASA Grade Assessment: III - A patient with  severe systemic disease. After reviewing the risks                            and benefits, the patient was deemed in                            satisfactory condition to undergo the procedure.                           After obtaining informed consent, the colonoscope                            was passed under direct vision. Throughout the                            procedure, the patient's blood  pressure, pulse, and                            oxygen saturations were monitored continuously. The                            Colonoscope was introduced through the anus and                            advanced to the the cecum, identified by                            appendiceal orifice and ileocecal valve. The                            colonoscopy was performed without difficulty. The                            patient tolerated the procedure well. The quality                            of the bowel preparation was good. The ileocecal                            valve, appendiceal orifice, and rectum were                            photographed. Scope In: 1:41:02 PM Scope Out: 2:06:46 PM Scope Withdrawal Time: 0 hours 22 minutes 3 seconds  Total Procedure Duration: 0 hours 25 minutes 44 seconds  Findings:                 The digital rectal exam was normal.                           A 3 mm polyp was found in the cecum. The polyp was                            sessile. The polyp was removed with a cold snare.  Resection and retrieval were complete.                           A 2 mm polyp was found in the ileocecal valve. The                            polyp was sessile. The polyp was removed with a                            cold biopsy forceps. Resection and retrieval were                            complete.                           A 4 mm polyp was found in the proximal ascending                            colon. The polyp was sessile. The polyp was removed                            with a cold snare. Resection and retrieval were                            complete.                           A 25-30 mm polyp was found in the hepatic flexure.                            The polyp was carpet-like and difficult to see.                            This polyp is located at the hepatic flexure and                            crosses 2 folds and is laterally  spreading. This                            was biopsied with a cold forceps for histology.                            Area distal to this lesion was tattooed with 2                            injections of Spot (carbon black), 3 mL total.                           A 3 mm polyp was found in the distal transverse                            colon. The polyp was sessile. The polyp was removed  with a cold biopsy forceps. Resection and retrieval                            were complete.                           A 4 mm polyp was found in the rectum. The polyp was                            sessile. The polyp was removed with a cold biopsy                            forceps. Resection and retrieval were complete.                           Internal hemorrhoids were found during                            retroflexion. The hemorrhoids were small. Complications:            No immediate complications. Estimated Blood Loss:     Estimated blood loss was minimal. Impression:               - One 3 mm polyp in the cecum, removed with a cold                            snare. Resected and retrieved.                           - One 2 mm polyp at the ileocecal valve, removed                            with a cold biopsy forceps. Resected and retrieved.                           - One 4 mm polyp in the proximal ascending colon,                            removed with a cold snare. Resected and retrieved.                           - One 25-30 mm polyp at the hepatic flexure.                            Biopsied. Tattooed.                           - One 3 mm polyp in the distal transverse colon,                            removed with a cold biopsy forceps. Resected and                            retrieved.                           -  One 4 mm polyp in the rectum, removed with a cold                            biopsy forceps. Resected and retrieved.                           -  Internal hemorrhoids. Recommendation:           - Patient has a contact number available for                            emergencies. The signs and symptoms of potential                            delayed complications were discussed with the                            patient. Return to normal activities tomorrow.                            Written discharge instructions were provided to the                            patient.                           - Resume previous diet.                           - Continue present medications.                           - Await pathology results.                           - Repeat colonoscopy is recommended. The                            colonoscopy date will be determined after pathology                            results from today's exam become available for                            review. Jerene Bears, MD 10/21/2017 2:20:18 PM This report has been signed electronically.

## 2017-10-21 NOTE — Progress Notes (Signed)
Report to PACU, RN, vss, BBS= Clear.  

## 2017-10-21 NOTE — Progress Notes (Signed)
Called to room to assist during endoscopic procedure.  Patient ID and intended procedure confirmed with present staff. Received instructions for my participation in the procedure from the performing physician.  

## 2017-10-24 ENCOUNTER — Telehealth: Payer: Self-pay

## 2017-10-24 NOTE — Telephone Encounter (Incomplete)
  Follow up Call-  Call back number 10/21/2017  Post procedure Call Back phone  # [phone]  Permission to leave phone message Yes  Some recent data might be hidden     Patient questions:  Do you have a fever, pain , or abdominal swelling? No. Pain Score  0 *  Have you tolerated food without any problems? Yes.    Have you been able to return to your normal activities? Yes.    Do you have any questions about your discharge instructions: Diet   No. Medications  No. Follow up visit  No.  Do you have questions or concerns about your Care? No.  Actions: * If pain score is 4 or above: {ACTION; [facility] ENDO PAIN >4:21563::"No action needed, pain <4."}

## 2017-10-27 ENCOUNTER — Encounter (HOSPITAL_COMMUNITY): Payer: Self-pay | Admitting: Emergency Medicine

## 2017-10-27 ENCOUNTER — Observation Stay (HOSPITAL_COMMUNITY)
Admission: EM | Admit: 2017-10-27 | Discharge: 2017-10-28 | Disposition: A | Discharge status: Home / Self Care | Payer: Medicare Other | Attending: Cardiology | Admitting: Cardiology

## 2017-10-27 ENCOUNTER — Emergency Department (HOSPITAL_COMMUNITY): Payer: Medicare Other

## 2017-10-27 ENCOUNTER — Other Ambulatory Visit: Payer: Self-pay

## 2017-10-27 ENCOUNTER — Telehealth (HOSPITAL_COMMUNITY): Payer: Self-pay | Admitting: *Deleted

## 2017-10-27 DIAGNOSIS — K6289 Other specified diseases of anus and rectum: Secondary | ICD-10-CM | POA: Diagnosis not present

## 2017-10-27 DIAGNOSIS — I5032 Chronic diastolic (congestive) heart failure: Secondary | ICD-10-CM | POA: Diagnosis not present

## 2017-10-27 DIAGNOSIS — J984 Other disorders of lung: Secondary | ICD-10-CM | POA: Diagnosis not present

## 2017-10-27 DIAGNOSIS — I712 Thoracic aortic aneurysm, without rupture: Secondary | ICD-10-CM | POA: Diagnosis not present

## 2017-10-27 DIAGNOSIS — M1990 Unspecified osteoarthritis, unspecified site: Secondary | ICD-10-CM | POA: Diagnosis not present

## 2017-10-27 DIAGNOSIS — E7800 Pure hypercholesterolemia, unspecified: Secondary | ICD-10-CM | POA: Diagnosis not present

## 2017-10-27 DIAGNOSIS — J90 Pleural effusion, not elsewhere classified: Secondary | ICD-10-CM | POA: Diagnosis not present

## 2017-10-27 DIAGNOSIS — Z888 Allergy status to other drugs, medicaments and biological substances status: Secondary | ICD-10-CM | POA: Diagnosis not present

## 2017-10-27 DIAGNOSIS — E876 Hypokalemia: Secondary | ICD-10-CM | POA: Insufficient documentation

## 2017-10-27 DIAGNOSIS — I4891 Unspecified atrial fibrillation: Secondary | ICD-10-CM | POA: Diagnosis not present

## 2017-10-27 DIAGNOSIS — Z79899 Other long term (current) drug therapy: Secondary | ICD-10-CM | POA: Diagnosis not present

## 2017-10-27 DIAGNOSIS — M4854XA Collapsed vertebra, not elsewhere classified, thoracic region, initial encounter for fracture: Secondary | ICD-10-CM | POA: Diagnosis not present

## 2017-10-27 DIAGNOSIS — I6529 Occlusion and stenosis of unspecified carotid artery: Secondary | ICD-10-CM | POA: Insufficient documentation

## 2017-10-27 DIAGNOSIS — E785 Hyperlipidemia, unspecified: Secondary | ICD-10-CM | POA: Diagnosis not present

## 2017-10-27 DIAGNOSIS — I480 Paroxysmal atrial fibrillation: Secondary | ICD-10-CM | POA: Diagnosis not present

## 2017-10-27 DIAGNOSIS — Z800 Family history of malignant neoplasm of digestive organs: Secondary | ICD-10-CM | POA: Diagnosis not present

## 2017-10-27 DIAGNOSIS — Z7901 Long term (current) use of anticoagulants: Secondary | ICD-10-CM | POA: Diagnosis not present

## 2017-10-27 DIAGNOSIS — K5909 Other constipation: Secondary | ICD-10-CM | POA: Diagnosis not present

## 2017-10-27 DIAGNOSIS — I350 Nonrheumatic aortic (valve) stenosis: Secondary | ICD-10-CM | POA: Diagnosis not present

## 2017-10-27 DIAGNOSIS — L719 Rosacea, unspecified: Secondary | ICD-10-CM | POA: Insufficient documentation

## 2017-10-27 DIAGNOSIS — S3992XA Unspecified injury of lower back, initial encounter: Secondary | ICD-10-CM | POA: Diagnosis not present

## 2017-10-27 DIAGNOSIS — Z860100 Personal history of colon polyps, unspecified: Secondary | ICD-10-CM

## 2017-10-27 DIAGNOSIS — I110 Hypertensive heart disease with heart failure: Secondary | ICD-10-CM | POA: Diagnosis not present

## 2017-10-27 DIAGNOSIS — Z87891 Personal history of nicotine dependence: Secondary | ICD-10-CM | POA: Diagnosis not present

## 2017-10-27 DIAGNOSIS — Z7982 Long term (current) use of aspirin: Secondary | ICD-10-CM | POA: Diagnosis not present

## 2017-10-27 DIAGNOSIS — I700 Atherosclerosis of aorta: Secondary | ICD-10-CM | POA: Diagnosis not present

## 2017-10-27 DIAGNOSIS — H9190 Unspecified hearing loss, unspecified ear: Secondary | ICD-10-CM | POA: Diagnosis not present

## 2017-10-27 DIAGNOSIS — X58XXXA Exposure to other specified factors, initial encounter: Secondary | ICD-10-CM | POA: Diagnosis not present

## 2017-10-27 DIAGNOSIS — Q231 Congenital insufficiency of aortic valve: Secondary | ICD-10-CM | POA: Diagnosis not present

## 2017-10-27 LAB — CBC
HCT: 48.2 % (ref 39.0–52.0)
Hemoglobin: 16 g/dL (ref 13.0–17.0)
MCH: 30.4 pg (ref 26.0–34.0)
MCHC: 33.2 g/dL (ref 30.0–36.0)
MCV: 91.5 fL (ref 78.0–100.0)
Platelets: 200 10*3/uL (ref 150–400)
RBC: 5.27 MIL/uL (ref 4.22–5.81)
RDW: 13 % (ref 11.5–15.5)
WBC: 4.9 10*3/uL (ref 4.0–10.5)

## 2017-10-27 LAB — I-STAT TROPONIN, ED: Troponin i, poc: 0.03 ng/mL (ref 0.00–0.08)

## 2017-10-27 LAB — BASIC METABOLIC PANEL
Anion gap: 10 (ref 5–15)
BUN: 12 mg/dL (ref 6–20)
CO2: 27 mmol/L (ref 22–32)
Calcium: 9.2 mg/dL (ref 8.9–10.3)
Chloride: 104 mmol/L (ref 101–111)
Creatinine, Ser: 0.99 mg/dL (ref 0.61–1.24)
GFR calc Af Amer: >60 mL/min (ref >60)
GFR calc non Af Amer: >60 mL/min (ref >60)
Glucose, Bld: 141 mg/dL — ABNORMAL HIGH (ref 65–99)
Potassium: 3.1 mmol/L — ABNORMAL LOW (ref 3.5–5.1)
Sodium: 141 mmol/L (ref 135–145)

## 2017-10-27 LAB — TROPONIN I: Troponin I: 0.05 ng/mL (ref <0.03)

## 2017-10-27 LAB — BRAIN NATRIURETIC PEPTIDE: B Natriuretic Peptide: 556.9 pg/mL — ABNORMAL HIGH (ref 0.0–100.0)

## 2017-10-27 LAB — MAGNESIUM: Magnesium: 1.8 mg/dL (ref 1.7–2.4)

## 2017-10-27 MED ORDER — SODIUM CHLORIDE 0.9 % IV SOLN
250.0000 mL | INTRAVENOUS | Status: DC | PRN
Start: 2017-10-27 — End: 2017-10-28

## 2017-10-27 MED ORDER — POTASSIUM CHLORIDE CRYS ER 20 MEQ PO TBCR
40.0000 meq | EXTENDED_RELEASE_TABLET | Freq: Once | ORAL | Status: AC
  Administered 2017-10-27: 40 meq via ORAL
  Filled 2017-10-27: qty 2

## 2017-10-27 MED ORDER — APIXABAN 5 MG PO TABS
5.0000 mg | ORAL_TABLET | Freq: Once | ORAL | Status: AC
  Administered 2017-10-27: 5 mg via ORAL
  Filled 2017-10-27: qty 1

## 2017-10-27 MED ORDER — SODIUM CHLORIDE 0.9% FLUSH: 3.0000 mL | INTRAVENOUS | Status: DC | PRN

## 2017-10-27 MED ORDER — APIXABAN 5 MG PO TABS
5.0000 mg | ORAL_TABLET | Freq: Once | ORAL | Status: AC
  Administered 2017-10-27: 5 mg via ORAL
  Filled 2017-10-27 (×2): qty 1

## 2017-10-27 MED ORDER — METOPROLOL SUCCINATE ER 50 MG PO TB24
50.0000 mg | ORAL_TABLET | Freq: Two times a day (BID) | ORAL | Status: DC
  Administered 2017-10-27 – 2017-10-28 (×2): 50 mg via ORAL
  Filled 2017-10-27 (×4): qty 1

## 2017-10-27 MED ORDER — SODIUM CHLORIDE 0.9% FLUSH
3.0000 mL | Freq: Two times a day (BID) | INTRAVENOUS | Status: DC
  Administered 2017-10-27 – 2017-10-28 (×2): 3 mL via INTRAVENOUS

## 2017-10-27 MED ORDER — ACETAMINOPHEN 325 MG PO TABS
650.0000 mg | ORAL_TABLET | ORAL | Status: DC | PRN
  Administered 2017-10-27: 650 mg via ORAL
  Filled 2017-10-27: qty 2

## 2017-10-27 MED ORDER — APIXABAN 5 MG PO TABS
5.0000 mg | ORAL_TABLET | Freq: Two times a day (BID) | ORAL | Status: DC
  Administered 2017-10-28: 5 mg via ORAL
  Filled 2017-10-27: qty 1

## 2017-10-27 MED ORDER — METOPROLOL TARTRATE 5 MG/5ML IV SOLN
5.0000 mg | Freq: Once | INTRAVENOUS | Status: AC
  Administered 2017-10-27: 5 mg via INTRAVENOUS
  Filled 2017-10-27: qty 5

## 2017-10-27 MED ORDER — SODIUM CHLORIDE 0.9 % IV SOLN
250.0000 mL | INTRAVENOUS | Status: DC
  Administered 2017-10-28: 12:00:00 via INTRAVENOUS

## 2017-10-27 MED ORDER — ONDANSETRON HCL 4 MG/2ML IJ SOLN: 4.0000 mg | Freq: Four times a day (QID) | INTRAMUSCULAR | Status: DC | PRN

## 2017-10-27 MED ORDER — HYDROCHLOROTHIAZIDE 25 MG PO TABS
12.5000 mg | ORAL_TABLET | Freq: Every day | ORAL | Status: DC
  Administered 2017-10-28: 12.5 mg via ORAL
  Filled 2017-10-27: qty 1
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
## 2017-10-27 NOTE — H&P (Signed)
Advanced Heart Failure Team History and Physical Note   PCP:  [name], [name], MD  PCP-Cardiology: No primary care provider on file.     Reason for Admission: Symptomatic Afib  HPI:    [name] [name] is a 77 y.o. male with paroxysmal Afib, HTN, and bicuspid aortic valve disorder.   Last seen in HF clinic 11/2016 for post hospital visit after episode of Afib and Cardioversion. Pt was on Eliquis at that time, but had stopped his amiodarone due to a multitude of side effects, including hearing changes, insomnia, and N/V. Of note, pt had PNA around this time as well.   Pt underwent screening colonoscopy 10/21/17 with h/o colon polyps. Pt had multiple polyps including one 25-30 mm polyp in the hepatic flexure concerning for malignancy. This was biopsied with a cold forceps for histology.  Pt presents to [facility] with 3 days of palpitations and SOB. He feels this way when he is in Afib. He did have a viral prodrome over the weekend with rhinorrhea and cough. He has been off of eliquis since at least May of last year. He was at his usual [facility] prior to this week, without any DOE. Denies orthopnea. He has been taking his other meds as directed. He refuses amiodarone with previous side effects as above, but says he will "consider".  Echo 10/21/2016 LVEF 55-60%, Functional bicuspid AV, Mod LAE.   Review of Systems: [y] = yes, [ ]  = no   General: Weight gain [ ] ; Weight loss [ ] ; Anorexia [ ] ; Fatigue [y]; Fever [ ] ; Chills [ ] ; Weakness [ ]   Cardiac: Chest pain/pressure [ ] ; Resting SOB [ ] ; Exertional SOB Blue.Reese ]; Orthopnea [ ] ; Pedal Edema [ ] ; Palpitations Blue.Reese ]; Syncope [ ] ; Presyncope [ ] ; Paroxysmal nocturnal dyspnea[ ]  Pulmonary: Cough [y]; Wheezing[ ] ; Hemoptysis[ ] ; Sputum [ ] ; Snoring [ ]   GI: Vomiting[ ] ; Dysphagia[ ] ; Melena[ ] ; Hematochezia [ ] ; Heartburn[ ] ; Abdominal pain [ ] ; Constipation [ ] ; Diarrhea [ ] ; BRBPR [ ]   GU: Hematuria[ ] ; Dysuria [ ] ; Nocturia[ ]   Vascular: Pain in legs with  walking [ ] ; Pain in feet with lying flat [ ] ; Non-healing sores [ ] ; Stroke [ ] ; TIA [ ] ; Slurred speech [ ] ;  Neuro: Headaches[ ] ; Vertigo[ ] ; Seizures[ ] ; Paresthesias[ ] ;Blurred vision [ ] ; Diplopia [ ] ; Vision changes [ ]   Ortho/Skin: Arthritis [y]; Joint pain [y]; Muscle pain [ ] ; Joint swelling [ ] ; Back Pain [ ] ; Rash [ ]   Psych: Depression[ ] ; Anxiety[ ]   Heme: Bleeding problems [ ] ; Clotting disorders [ ] ; Anemia [ ]   Endocrine: Diabetes [ ] ; Thyroid dysfunction[ ]   Home Medications Prior to Admission medications   Medication Sig Start Date End Date Taking? Authorizing Provider  Cholecalciferol (VITAMIN D3) 2000 units capsule Take 1 capsule (2,000 Units total) by mouth daily. 07/12/17   [name], [name], MD  hydrochlorothiazide (HYDRODIURIL) 12.5 MG tablet Take 12.5 mg by mouth daily.    [provider]  HYDROcodone-acetaminophen (NORCO) 7.5-325 MG tablet Take 0.5-1 tablets by mouth every 6 (six) hours as needed for severe pain. Patient not taking: Reported on 10/21/2017 10/12/17   [name], [name], MD  ibuprofen (ADVIL,MOTRIN) 200 MG tablet Take 200 mg by mouth as needed.    [provider]  metoprolol succinate (TOPROL-XL) 50 MG 24 hr tablet TAKE 1 TABLET (50 MG TOTAL) BY MOUTH 2 (TWO) TIMES DAILY. TAKE WITH OR IMMEDIATELY FOLLOWING A MEAL. 02/01/17  Larey Dresser, MD   Past Medical History: Past Medical History:  Diagnosis Date  . Adenomatous colon polyp   . Aortic stenosis, mild    Echo 07/11: 55-60%, mild LV hypertrophy, mild aortic stenosis w/mean gradient 63mmHg and peak gradient 36 mmHg.  . Ascending aortic aneurysm ([place])   . Bicuspid aortic valve   . Chest pain    ETT-myoview 12/11 w/exercise, no chest pain, no significant ST changes, EF 69%, no evidence for ischemia or infarction.  . CHF NYHA class I (no symptoms from ordinary activities), acute, diastolic ([place])   . Hemorrhoids   . HTN (hypertension)   . Hypercholesteremia   . LBP (low back  pain)   . Moderate aortic stenosis   . Osteoarthritis   . Paroxysmal atrial fibrillation ([place])    a. new onset Afib in 07/2008. He underwent ibutilide cardioversion successfully. b. Recurrence 01/2013 s/p TEE/DCCV - was on Xarelto but he stopped it as he was convinced it was causing joint pn. c. Recurrence 01/2016 - spont conv to NSR. Pt took Eliquis x1 mo then declined further anticoag. d. Recurrence 07/2016.  [name] Pneumonia    Past Surgical History: Past Surgical History:  Procedure Laterality Date  . BACK SURGERY  x12 years ago  . CARDIOVERSION N/A 01/26/2013   Procedure: CARDIOVERSION;  Surgeon: Larey Dresser, MD;  Location: [place];  Service: Cardiovascular;  Laterality: N/A;  . COLONOSCOPY    . HEMORRHOID SURGERY    . LUMBAR LAMINECTOMY    . POLYPECTOMY    . TEE WITHOUT CARDIOVERSION N/A 01/26/2013   Procedure: TRANSESOPHAGEAL ECHOCARDIOGRAM (TEE);  Surgeon: Larey Dresser, MD;  Location: [facility] ENDOSCOPY;  Service: Cardiovascular;  Laterality: N/A;   Family History:  Family History  Problem Relation Age of Onset  . Colon cancer Mother 94  . Hypertension Other   . Coronary artery disease Neg Hx   . Colon polyps Neg Hx   . Esophageal cancer Neg Hx   . Rectal cancer Neg Hx   . Stomach cancer Neg Hx     Social History: Social History   Socioeconomic History  . Marital status: Married    Spouse name: None  . Number of children: 0  . Years of education: None  . Highest education level: None  Social Needs  . Financial resource strain: None  . Food insecurity - worry: None  . Food insecurity - inability: None  . Transportation needs - medical: None  . Transportation needs - non-medical: None  Occupational History  . Occupation: Retired Lobbyist: [employer]  Tobacco Use  . Smoking status: Never Smoker  . Smokeless tobacco: Never Used  Substance and Sexual Activity  . Alcohol use: Yes    Alcohol/week: 0.0 oz    Comment: Drinks 1 glass of wine  nightly/socially  . Drug use: No  . Sexual activity: Yes  Other Topics Concern  . None  Social History Narrative   Patient lives in [place] w/ his wife. He is a native of [place]. 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   Right-handed   Caffeine: 2 cups coffee per day    Allergies:  Allergies  Allergen Reactions  . Xarelto [Rivaroxaban] Other (See Comments)    INCREASED BP-HYPERTENSIVE EVENTS  . Corticosteroids   . Benazepril Cough    Objective:    Vital Signs:   Temp:  [97.9 F (36.6 C)] 97.9 F (36.6 C) (02/14  1136) Pulse Rate:  [50] 50 (02/14 1136) Resp:  [18] 18 (02/14 1136) BP: (159)/(101) 159/101 (02/14 1136) SpO2:  [99 %] 99 % (02/14 1136)   Physical Exam     General:  Well appearing. No respiratory difficulty HEENT: Normal Neck: Supple. no JVD. Carotids 2+ bilat; + bilateral bruits. No lymphadenopathy or thyromegaly appreciated. Cor: PMI nondisplaced. Irregularly irregular. 2/6 AS and 2/6 MR Lungs: Clear Abdomen: Soft, nontender, nondistended. No hepatosplenomegaly. No bruits or masses. Good bowel sounds. Extremities: No cyanosis, clubbing, or rash. Trace ankle edema Neuro: Alert & oriented x 3, cranial nerves grossly intact. moves all 4 extremities w/o difficulty. Affect pleasant.  Telemetry   Afib 90-100s, personally reviewed  EKG   Afib with RVR in 100s, personally reviewed  Labs     Basic Metabolic Panel: Recent Labs  Lab 10/27/17 1138  NA 141  K 3.1*  CL 104  CO2 27  GLUCOSE 141*  BUN 12  CREATININE 0.99  CALCIUM 9.2    Liver Function Tests: No results for input(s): AST, ALT, ALKPHOS, BILITOT, PROT, ALBUMIN in the last 168 hours. No results for input(s): LIPASE, AMYLASE in the last 168 hours. No results for input(s): AMMONIA in the last 168 hours.  CBC: Recent Labs  Lab 10/27/17 1138  WBC 4.9  HGB 16.0  HCT 48.2  MCV 91.5  PLT 200    Cardiac Enzymes: No results for input(s):  CKTOTAL, CKMB, CKMBINDEX, TROPONINI in the last 168 hours.  BNP: BNP (last 3 results) No results for input(s): BNP in the last 8760 hours.  ProBNP (last 3 results) No results for input(s): PROBNP in the last 8760 hours.   CBG: No results for input(s): GLUCAP in the last 168 hours.  Coagulation Studies: No results for input(s): LABPROT, INR in the last 72 hours.  Imaging: Dg Chest 2 View  Result Date: 10/27/2017 CLINICAL DATA:  Cardiac arrhythmia.  Chest pressure EXAM: CHEST  2 VIEW COMPARISON:  July 12, 2017 FINDINGS: There is minimal bibasilar scarring. Lungs elsewhere are clear. Heart size and pulmonary vascularity are normal. No adenopathy. There is aortic atherosclerosis. There is degenerative change in the thoracic spine. IMPRESSION: Slight bibasilar scarring. No edema or consolidation. Stable cardiac silhouette. There is aortic atherosclerosis. Aortic Atherosclerosis (ICD10-I70.0). Electronically Signed   By: Lowella Grip III M.D.   On: 10/27/2017 12:09      Patient Profile   [name] [name] is a 77 y.o. male with Paroxysmal Afib, HTN, and bicuspid aortic valve disorder.   Presented to [facility] 10/27/17 with AFib RVR. Admitted for further evaluation and treatment.   Assessment/Plan   1. Atrial fibrillation: Paroxysmal. He is quite symptomatic when in atrial fibrillation.  CHADSVASC at least 3 - Pt does not want to take an anti-arryhthmic. We discussed watching for symptoms and managing labs.  - He has also refused to stay on an anticoagulant long-term, but is willing to go back on Eliquis.  - Will use Eliquis 5 mg BID, starting now, so he can get 3 doses prior to TEE/DCCV tomorrow. Discussed with Pharm D. - May be candidate for Afib ablation in the future. Previously did not follow up with EP as ordered.  - Continue Toprol XL 50 mg BID - QTC 495 ms, so not candidate for tikosyn. - Volume status stable on exam. Continue HCTZ 12.5 mg daily. - Excellent candidate for  amiodarone, but he refuses. Will consider if fails DCCV.  2. Bicuspid aortic valve disorder: Moderate AS on 2/18 echo, stable.  Echo in 2017 showed ascending aorta 4.4 cm but MRA chest showed maximal ascending aorta dimension 4.1 cm.  Repeat MRA chest in 11/18.   - Echo 10/21/2016 with functional bicuspid (fused R/L cusps, with moderate thickening, and mildly calcified leaflets. Moderate stenosis by 2D imaging.  - TEE tomorrow.  3. Hypokalemia - Supp ordered. Will follow in am.   Will admit to Tele for TEE/DCCV tomorrow. Will continue to discuss amiodarone with patient, as he has had success on this before. He is hesitant to take due to non-specific side effects.   Shirley Friar, PA-C 10/27/2017, 3:08 PM  Advanced Heart Failure Team Pager [phone] (M-F; 7a - 4p)  Please contact [facility] for night-coverage after hours (4p -7a ) and weekends on [email or web address]  77 y/o male as above with moderate AS in setting of bicuspid AoV and PAF presents for recurrent symptomatic AF. Symptomatically feels like he has been in AF since Monday. Had previous DC-CV about 1 year ago. Stopped Eliquis and amiodarone months ago. Says he is willing to take Eliquis again but refuses amiodarone. Echo from 2/8 reviewed personally and had moderate LAE (74mm). He understands that without AA therapy may not hold NSR for long.   On exam he is comfortable. BP stable. AF running 90-110 bpm. No overt HF.   We have discussed with Pharmacy and Endoscopy. Will give first dose of Eliquis at 430p and then a second dose at midnight and a thrid dose tomorrow morning to help achieve stead-state prior to TEE and DC-CV tomorrow at noon,.   Glori Bickers, MD  7:29 PM

## 2017-10-27 NOTE — Telephone Encounter (Signed)
Patient left VM on triage line stating his HR has started jumping up in the 115's since yesterday.  Said he may just go to the ER but wanted to call us first.  I tried calling him back but had to leave VM asking for him to return our call.

## 2017-10-27 NOTE — ED Triage Notes (Signed)
Pt presents to ED for assessment of irregular heart beat, pressure in his chest, and intermittent SOB.  Patient states hx of irregular heart beat, but never diagnosed.  Patient states the palpitations "usually go away" but states this time it wont.  Patient has a colonoscopy on Friday.

## 2017-10-28 ENCOUNTER — Encounter (HOSPITAL_COMMUNITY)
Admission: EM | Disposition: A | Discharge status: Home / Self Care | Payer: Self-pay | Source: Home / Self Care | Attending: Emergency Medicine

## 2017-10-28 ENCOUNTER — Other Ambulatory Visit: Payer: Self-pay

## 2017-10-28 ENCOUNTER — Encounter (HOSPITAL_COMMUNITY): Payer: Self-pay | Admitting: Critical Care Medicine

## 2017-10-28 ENCOUNTER — Observation Stay (HOSPITAL_COMMUNITY): Payer: Medicare Other | Admitting: Anesthesiology

## 2017-10-28 ENCOUNTER — Observation Stay (HOSPITAL_BASED_OUTPATIENT_CLINIC_OR_DEPARTMENT_OTHER): Payer: Medicare Other

## 2017-10-28 ENCOUNTER — Other Ambulatory Visit (HOSPITAL_COMMUNITY): Payer: Self-pay

## 2017-10-28 DIAGNOSIS — I4891 Unspecified atrial fibrillation: Secondary | ICD-10-CM

## 2017-10-28 DIAGNOSIS — I340 Nonrheumatic mitral (valve) insufficiency: Secondary | ICD-10-CM | POA: Diagnosis not present

## 2017-10-28 DIAGNOSIS — M1990 Unspecified osteoarthritis, unspecified site: Secondary | ICD-10-CM | POA: Diagnosis not present

## 2017-10-28 DIAGNOSIS — L719 Rosacea, unspecified: Secondary | ICD-10-CM | POA: Diagnosis not present

## 2017-10-28 DIAGNOSIS — H9190 Unspecified hearing loss, unspecified ear: Secondary | ICD-10-CM | POA: Diagnosis not present

## 2017-10-28 DIAGNOSIS — I5032 Chronic diastolic (congestive) heart failure: Secondary | ICD-10-CM | POA: Diagnosis not present

## 2017-10-28 DIAGNOSIS — I10 Essential (primary) hypertension: Secondary | ICD-10-CM | POA: Diagnosis not present

## 2017-10-28 DIAGNOSIS — Q231 Congenital insufficiency of aortic valve: Secondary | ICD-10-CM | POA: Diagnosis not present

## 2017-10-28 DIAGNOSIS — I712 Thoracic aortic aneurysm, without rupture: Secondary | ICD-10-CM | POA: Diagnosis not present

## 2017-10-28 DIAGNOSIS — I480 Paroxysmal atrial fibrillation: Secondary | ICD-10-CM

## 2017-10-28 DIAGNOSIS — E785 Hyperlipidemia, unspecified: Secondary | ICD-10-CM | POA: Diagnosis not present

## 2017-10-28 DIAGNOSIS — I110 Hypertensive heart disease with heart failure: Secondary | ICD-10-CM | POA: Diagnosis not present

## 2017-10-28 DIAGNOSIS — J90 Pleural effusion, not elsewhere classified: Secondary | ICD-10-CM | POA: Diagnosis not present

## 2017-10-28 DIAGNOSIS — K5909 Other constipation: Secondary | ICD-10-CM | POA: Diagnosis not present

## 2017-10-28 DIAGNOSIS — Z87891 Personal history of nicotine dependence: Secondary | ICD-10-CM | POA: Diagnosis not present

## 2017-10-28 DIAGNOSIS — I350 Nonrheumatic aortic (valve) stenosis: Secondary | ICD-10-CM | POA: Diagnosis not present

## 2017-10-28 HISTORY — PX: CARDIOVERSION: SHX1299

## 2017-10-28 HISTORY — PX: TEE WITHOUT CARDIOVERSION: SHX5443

## 2017-10-28 LAB — COMPREHENSIVE METABOLIC PANEL
ALT: 19 U/L (ref 17–63)
AST: 26 U/L (ref 15–41)
Albumin: 3.6 g/dL (ref 3.5–5.0)
Alkaline Phosphatase: 68 U/L (ref 38–126)
Anion gap: 13 (ref 5–15)
BUN: 14 mg/dL (ref 6–20)
CO2: 22 mmol/L (ref 22–32)
Calcium: 9.2 mg/dL (ref 8.9–10.3)
Chloride: 106 mmol/L (ref 101–111)
Creatinine, Ser: 0.83 mg/dL (ref 0.61–1.24)
GFR calc Af Amer: >60 mL/min (ref >60)
GFR calc non Af Amer: >60 mL/min (ref >60)
Glucose, Bld: 101 mg/dL — ABNORMAL HIGH (ref 65–99)
Potassium: 3.7 mmol/L (ref 3.5–5.1)
Sodium: 141 mmol/L (ref 135–145)
Total Bilirubin: 0.9 mg/dL (ref 0.3–1.2)
Total Protein: 6.5 g/dL (ref 6.5–8.1)

## 2017-10-28 SURGERY — ECHOCARDIOGRAM, TRANSESOPHAGEAL
Anesthesia: General

## 2017-10-28 MED ORDER — POTASSIUM CHLORIDE CRYS ER 20 MEQ PO TBCR
40.0000 meq | EXTENDED_RELEASE_TABLET | Freq: Once | ORAL | Status: AC
  Administered 2017-10-28: 40 meq via ORAL
  Filled 2017-10-28: qty 2

## 2017-10-28 MED ORDER — HYDROCHLOROTHIAZIDE 12.5 MG PO CAPS
12.5000 mg | ORAL_CAPSULE | Freq: Once | ORAL | Status: AC
  Filled 2017-10-28: qty 1

## 2017-10-28 MED ORDER — POTASSIUM CHLORIDE CRYS ER 20 MEQ PO TBCR: 20.0000 meq | EXTENDED_RELEASE_TABLET | Freq: Every day | ORAL | 6 refills | Status: DC

## 2017-10-28 MED ORDER — APIXABAN 5 MG PO TABS: 5.0000 mg | ORAL_TABLET | Freq: Two times a day (BID) | ORAL | 6 refills | Status: DC

## 2017-10-28 MED ORDER — HYDROCHLOROTHIAZIDE 25 MG PO TABS: 25.0000 mg | ORAL_TABLET | Freq: Every day | ORAL | 6 refills | Status: DC

## 2017-10-28 MED ORDER — SODIUM CHLORIDE 0.9 % IV SOLN: INTRAVENOUS | Status: DC

## 2017-10-28 MED ORDER — HYDROCHLOROTHIAZIDE 25 MG PO TABS: 25.0000 mg | ORAL_TABLET | Freq: Every day | ORAL | Status: DC

## 2017-10-28 MED ORDER — PROPOFOL 500 MG/50ML IV EMUL
INTRAVENOUS | Status: DC | PRN
  Administered 2017-10-28: 200 ug/kg/min via INTRAVENOUS

## 2017-10-28 MED ORDER — POTASSIUM CHLORIDE CRYS ER 20 MEQ PO TBCR: 20.0000 meq | EXTENDED_RELEASE_TABLET | Freq: Every day | ORAL | Status: DC

## 2017-10-28 NOTE — Progress Notes (Signed)
Patient is being discharged home. Reviewed discharge instructions with patient and patient's wife. Answered their questions. Pt is stable and ready for discharge.

## 2017-10-28 NOTE — Interval H&P Note (Signed)
History and Physical Interval Note:  10/28/2017 12:08 PM  [name] [name]  has presented today for surgery, with the diagnosis of a fib  The various methods of treatment have been discussed with the patient and family. After consideration of risks, benefits and other options for treatment, the patient has consented to  Procedure(s): TRANSESOPHAGEAL ECHOCARDIOGRAM (TEE) (N/A) CARDIOVERSION (N/A) as a surgical intervention .  The patient's history has been reviewed, patient examined, no change in status, stable for surgery.  I have reviewed the patient's chart and labs.  Questions were answered to the patient's satisfaction.     Marq Rebello [employer]

## 2017-10-28 NOTE — Progress Notes (Signed)
  Echocardiogram Echocardiogram Transesophageal has been performed.  [name] [name] L Androw 10/28/2017, 12:53 PM

## 2017-10-28 NOTE — Progress Notes (Signed)
Patient ID: [name] [name], male   DOB: [birth year], 77 y.o.   MRN: [id number]     Advanced Heart Failure Rounding Note  Cardiologist: Aundra Dubin  Subjective:    He remains in atrial fibrillation today, rate in 100s.  BP elevated.  He thinks he went into atrial fibrillation on Monday.  Has felt palpitations and mild dyspnea.  Prior to Monday, no significant exertional dyspnea.  No chest pain.    Objective:   Weight Range: 195 lb 15.8 oz (88.9 kg) Body mass index is 28.12 kg/m.   Vital Signs:   Temp:  [97.9 F (36.6 C)-98 F (36.7 C)] 97.9 F (36.6 C) (02/15 0800) Pulse Rate:  [34-103] 96 (02/15 0800) Resp:  [12-31] 20 (02/15 0800) BP: (126-159)/(82-110) 138/84 (02/15 0800) SpO2:  [96 %-99 %] 98 % (02/15 0800) Weight:  [195 lb 15.8 oz (88.9 kg)-196 lb 3.2 oz (89 kg)] 195 lb 15.8 oz (88.9 kg) (02/15 0500) Last BM Date: 10/27/17  Weight change: Filed Weights   10/27/17 2258 10/28/17 0500  Weight: 196 lb 3.2 oz (89 kg) 195 lb 15.8 oz (88.9 kg)    Intake/Output:   Intake/Output Summary (Last 24 hours) at 10/28/2017 0940 Last data filed at 10/28/2017 0837 Gross per 24 hour  Intake 123 ml  Output 100 ml  Net 23 ml      Physical Exam    General:  Well appearing. No resp difficulty HEENT: Normal Neck: Supple. JVP not elevated. Carotids 2+ bilat; no bruits. No lymphadenopathy or thyromegaly appreciated. Cor: PMI nondisplaced. Mildly tachy, irregular rate & rhythm. No rubs, gallops.  3/6 crescendo-decrescendo murmur RUSB, S2 heard clearly.  Lungs: Clear Abdomen: Soft, nontender, nondistended. No hepatosplenomegaly. No bruits or masses. Good bowel sounds. Extremities: No cyanosis, clubbing, rash, edema Neuro: Alert & orientedx3, cranial nerves grossly intact. moves all 4 extremities w/o difficulty. Affect pleasant   Telemetry   Atrial fibrillation in 100s, personally reviewed    Labs    CBC Recent Labs    10/27/17 1138  WBC 4.9  HGB 16.0  HCT 48.2  MCV 91.5  PLT  812   Basic Metabolic Panel Recent Labs    10/27/17 1138 10/27/17 1452 10/28/17 0556  NA 141  --  141  K 3.1*  --  3.7  CL 104  --  106  CO2 27  --  22  GLUCOSE 141*  --  101*  BUN 12  --  14  CREATININE 0.99  --  0.83  CALCIUM 9.2  --  9.2  MG  --  1.8  --    Liver Function Tests Recent Labs    10/28/17 0556  AST 26  ALT 19  ALKPHOS 68  BILITOT 0.9  PROT 6.5  ALBUMIN 3.6   No results for input(s): LIPASE, AMYLASE in the last 72 hours. Cardiac Enzymes Recent Labs    10/27/17 1452  TROPONINI 0.05*    BNP: BNP (last 3 results) Recent Labs    10/27/17 1452  BNP 556.9*    ProBNP (last 3 results) No results for input(s): PROBNP in the last 8760 hours.   D-Dimer No results for input(s): DDIMER in the last 72 hours. Hemoglobin A1C No results for input(s): HGBA1C in the last 72 hours. Fasting Lipid Panel No results for input(s): CHOL, HDL, LDLCALC, TRIG, CHOLHDL, LDLDIRECT in the last 72 hours. Thyroid Function Tests No results for input(s): TSH, T4TOTAL, T3FREE, THYROIDAB in the last 72 hours.  Invalid input(s): FREET3  Other results:  Imaging    Dg Chest 2 View  Result Date: 10/27/2017 CLINICAL DATA:  Cardiac arrhythmia.  Chest pressure EXAM: CHEST  2 VIEW COMPARISON:  July 12, 2017 FINDINGS: There is minimal bibasilar scarring. Lungs elsewhere are clear. Heart size and pulmonary vascularity are normal. No adenopathy. There is aortic atherosclerosis. There is degenerative change in the thoracic spine. IMPRESSION: Slight bibasilar scarring. No edema or consolidation. Stable cardiac silhouette. There is aortic atherosclerosis. Aortic Atherosclerosis (ICD10-I70.0). Electronically Signed   By: Lowella Grip III M.D.   On: 10/27/2017 12:09      Medications:     Scheduled Medications: . apixaban  5 mg Oral BID  . [START ON 10/29/2017] hydrochlorothiazide  25 mg Oral Daily  . hydrochlorothiazide  12.5 mg Oral Once  . metoprolol succinate  50  mg Oral BID  . [START ON 10/29/2017] potassium chloride  20 mEq Oral Daily  . potassium chloride  40 mEq Oral Once  . sodium chloride flush  3 mL Intravenous Q12H  . sodium chloride flush  3 mL Intravenous Q12H     Infusions: . sodium chloride    . sodium chloride       PRN Medications:  sodium chloride, acetaminophen, ondansetron (ZOFRAN) IV, sodium chloride flush, sodium chloride flush    Patient Profile   [name] [name] is a 77 y.o. male with Paroxysmal Afib, HTN, and bicuspid aortic valve disorder.   Presented to [facility] 10/27/17 with AFib RVR. Admitted for further evaluation and treatment.   Assessment/Plan   1. Atrial fibrillation: Paroxysmal, last known episode was around this time last year.  He noted onset of RVR on Monday. He is quite symptomatic when in atrial fibrillation with dyspnea.  CHADSVASC = 3.  He remains in atrial fibrillation this morning.  - He does not want to take an anti-arrhythmic medication. He was sent home from the hospital last year when he had afib on amiodarone but stopped it.  He does not want to restart amiodarone or any other anti-arrhythmic.  QTc is prolonged so not a good Tikosyn candidate anyway.  - In the past, he has refused to stay on an anticoagulant long-term.  After last year's cardioversion, he stopped Eliquis after 2 months.  We again discussed the lowering of CVA risk with NOAC use.  He is now back on Eliquis.  - Will plan TEE-guided DCCV today.  I discussed risks/benefits of procedure with patient and he agrees to proceed.  - Continue Toprol XL 50 mg bid.   - We discussed atrial fibrillation ablation again today.  I think this is reasonable with his symptomatic atrial fibrillation episodes.  I will refer him to EP after discharge.  2. HTN: BP high.  Continue Toprol XL and increase HCTZ to 25 mg daily.  Replace K.  3. Bicuspid aortic valve disorder: Moderate AS on 2/18 echo, stable.  Echo in 2017 showed ascending aorta 4.4 cm but MRA chest  showed maximal ascending aorta dimension 4.1 cm.  Will reassess aortic valve and aorta on TEE today.   Possible discharge home after cardioversion this afternoon.     Length of Stay: 0  [name], MD  10/28/2017, 9:40 AM  Advanced Heart Failure Team Pager [phone] (M-F; 7a - 4p)  Please contact [facility] for night-coverage after hours (4p -7a ) and weekends on [email or web address]

## 2017-10-28 NOTE — Consult Note (Addendum)
   [facility] CM Inpatient Consult   10/28/2017  Gaylan Davalos 01-27-1941 [id number]  Came by to see patient in the Medicare ACO.  Patient had already gone home.  Will follow with EMMI general calls.  Primary Care Provider listed as Dr. Lew Dawes  Addedum:  2820: Update/corrected information.   Received a call from [facility] that patient had not left.  Patient back in room. Mistake on this writer's part. Patient was off the unit for a procedure. Met with the patient and wife, Zora regarding post hospital follow up with calls.  Patient and wife, agrees to the follow up call.  A brochure, 24 hour nurse advise line magnet given.  Patient denies any issues with transportation, medication needs, or issues for follow up.  Natividad Brood, RN BSN [facility] Liaison  [phone] business mobile phone Toll free office [phone]

## 2017-10-28 NOTE — Anesthesia Postprocedure Evaluation (Signed)
Anesthesia Post Note  Patient: [name] [name]  Procedure(s) Performed: TRANSESOPHAGEAL ECHOCARDIOGRAM (TEE) (N/A ) CARDIOVERSION (N/A )     Patient location during evaluation: Endoscopy Anesthesia Type: General Level of consciousness: awake and alert Pain management: pain level controlled Vital Signs Assessment: post-procedure vital signs reviewed and stable Respiratory status: spontaneous breathing, nonlabored ventilation, respiratory function stable and patient connected to nasal cannula oxygen Cardiovascular status: stable and blood pressure returned to baseline Postop Assessment: no apparent nausea or vomiting Anesthetic complications: no    Last Vitals:  Vitals:   10/28/17 1240 10/28/17 1250  BP: 94/60 113/74  Pulse: 70 69  Resp: 19 20  Temp:    SpO2: 95% 96%    Last Pain:  Vitals:   10/28/17 1237  TempSrc: Oral  PainSc:                  Barnet Glasgow

## 2017-10-28 NOTE — Discharge Instructions (Signed)

## 2017-10-28 NOTE — Anesthesia Preprocedure Evaluation (Addendum)
Anesthesia Evaluation  Patient identified by MRN, date of birth, ID band Patient awake    Reviewed: Allergy & Precautions, NPO status , Patient's Chart, lab work & pertinent test results  Airway Mallampati: II  TM Distance: >3 FB Neck ROM: Full    Dental no notable dental hx.    Pulmonary neg pulmonary ROS,    Pulmonary exam normal breath sounds clear to auscultation       Cardiovascular hypertension, negative cardio ROS Normal cardiovascular exam Rhythm:Regular Rate:Normal  Bicuspid Aortic Valve   Neuro/Psych negative neurological ROS  negative psych ROS   GI/Hepatic negative GI ROS, Neg liver ROS,   Endo/Other  negative endocrine ROS  Renal/GU negative Renal ROS  negative genitourinary   Musculoskeletal negative musculoskeletal ROS (+)   Abdominal   Peds negative pediatric ROS (+)  Hematology negative hematology ROS (+)   Anesthesia Other Findings   Reproductive/Obstetrics negative OB ROS                            Anesthesia Physical Anesthesia Plan  ASA: III  Anesthesia Plan: MAC   Post-op Pain Management:    Induction: Intravenous  PONV Risk Score and Plan:   Airway Management Planned: Mask, Natural Airway and Nasal Cannula  Additional Equipment:   Intra-op Plan:   Post-operative Plan:   Informed Consent: I have reviewed the patients History and Physical, chart, labs and discussed the procedure including the risks, benefits and alternatives for the proposed anesthesia with the patient or authorized representative who has indicated his/her understanding and acceptance.     Plan Discussed with: CRNA and Anesthesiologist  Anesthesia Plan Comments:       Anesthesia Quick Evaluation

## 2017-10-28 NOTE — H&P (View-Only) (Signed)
Patient ID: [name] [name], male   DOB: 12/05/1940, 77 y.o.   MRN: [id number]     Advanced Heart Failure Rounding Note  Cardiologist: Aundra Dubin  Subjective:    He remains in atrial fibrillation today, rate in 100s.  BP elevated.  He thinks he went into atrial fibrillation on Monday.  Has felt palpitations and mild dyspnea.  Prior to Monday, no significant exertional dyspnea.  No chest pain.    Objective:   Weight Range: 195 lb 15.8 oz (88.9 kg) Body mass index is 28.12 kg/m.   Vital Signs:   Temp:  [97.9 F (36.6 C)-98 F (36.7 C)] 97.9 F (36.6 C) (02/15 0800) Pulse Rate:  [34-103] 96 (02/15 0800) Resp:  [12-31] 20 (02/15 0800) BP: (126-159)/(82-110) 138/84 (02/15 0800) SpO2:  [96 %-99 %] 98 % (02/15 0800) Weight:  [195 lb 15.8 oz (88.9 kg)-196 lb 3.2 oz (89 kg)] 195 lb 15.8 oz (88.9 kg) (02/15 0500) Last BM Date: 10/27/17  Weight change: Filed Weights   10/27/17 2258 10/28/17 0500  Weight: 196 lb 3.2 oz (89 kg) 195 lb 15.8 oz (88.9 kg)    Intake/Output:   Intake/Output Summary (Last 24 hours) at 10/28/2017 0940 Last data filed at 10/28/2017 0837 Gross per 24 hour  Intake 123 ml  Output 100 ml  Net 23 ml      Physical Exam    General:  Well appearing. No resp difficulty HEENT: Normal Neck: Supple. JVP not elevated. Carotids 2+ bilat; no bruits. No lymphadenopathy or thyromegaly appreciated. Cor: PMI nondisplaced. Mildly tachy, irregular rate & rhythm. No rubs, gallops.  3/6 crescendo-decrescendo murmur RUSB, S2 heard clearly.  Lungs: Clear Abdomen: Soft, nontender, nondistended. No hepatosplenomegaly. No bruits or masses. Good bowel sounds. Extremities: No cyanosis, clubbing, rash, edema Neuro: Alert & orientedx3, cranial nerves grossly intact. moves all 4 extremities w/o difficulty. Affect pleasant   Telemetry   Atrial fibrillation in 100s, personally reviewed    Labs    CBC Recent Labs    10/27/17 1138  WBC 4.9  HGB 16.0  HCT 48.2  MCV 91.5  PLT  786   Basic Metabolic Panel Recent Labs    10/27/17 1138 10/27/17 1452 10/28/17 0556  NA 141  --  141  K 3.1*  --  3.7  CL 104  --  106  CO2 27  --  22  GLUCOSE 141*  --  101*  BUN 12  --  14  CREATININE 0.99  --  0.83  CALCIUM 9.2  --  9.2  MG  --  1.8  --    Liver Function Tests Recent Labs    10/28/17 0556  AST 26  ALT 19  ALKPHOS 68  BILITOT 0.9  PROT 6.5  ALBUMIN 3.6   No results for input(s): LIPASE, AMYLASE in the last 72 hours. Cardiac Enzymes Recent Labs    10/27/17 1452  TROPONINI 0.05*    BNP: BNP (last 3 results) Recent Labs    10/27/17 1452  BNP 556.9*    ProBNP (last 3 results) No results for input(s): PROBNP in the last 8760 hours.   D-Dimer No results for input(s): DDIMER in the last 72 hours. Hemoglobin A1C No results for input(s): HGBA1C in the last 72 hours. Fasting Lipid Panel No results for input(s): CHOL, HDL, LDLCALC, TRIG, CHOLHDL, LDLDIRECT in the last 72 hours. Thyroid Function Tests No results for input(s): TSH, T4TOTAL, T3FREE, THYROIDAB in the last 72 hours.  Invalid input(s): FREET3  Other results:  Imaging    Dg Chest 2 View  Result Date: 10/27/2017 CLINICAL DATA:  Cardiac arrhythmia.  Chest pressure EXAM: CHEST  2 VIEW COMPARISON:  July 12, 2017 FINDINGS: There is minimal bibasilar scarring. Lungs elsewhere are clear. Heart size and pulmonary vascularity are normal. No adenopathy. There is aortic atherosclerosis. There is degenerative change in the thoracic spine. IMPRESSION: Slight bibasilar scarring. No edema or consolidation. Stable cardiac silhouette. There is aortic atherosclerosis. Aortic Atherosclerosis (ICD10-I70.0). Electronically Signed   By: Lowella Grip III M.D.   On: 10/27/2017 12:09      Medications:     Scheduled Medications: . apixaban  5 mg Oral BID  . [START ON 10/29/2017] hydrochlorothiazide  25 mg Oral Daily  . hydrochlorothiazide  12.5 mg Oral Once  . metoprolol succinate  50  mg Oral BID  . [START ON 10/29/2017] potassium chloride  20 mEq Oral Daily  . potassium chloride  40 mEq Oral Once  . sodium chloride flush  3 mL Intravenous Q12H  . sodium chloride flush  3 mL Intravenous Q12H     Infusions: . sodium chloride    . sodium chloride       PRN Medications:  sodium chloride, acetaminophen, ondansetron (ZOFRAN) IV, sodium chloride flush, sodium chloride flush    Patient Profile   [name] [name] is a 77 y.o. male with Paroxysmal Afib, HTN, and bicuspid aortic valve disorder.   Presented to [facility] 10/27/17 with AFib RVR. Admitted for further evaluation and treatment.   Assessment/Plan   1. Atrial fibrillation: Paroxysmal, last known episode was around this time last year.  He noted onset of RVR on Monday. He is quite symptomatic when in atrial fibrillation with dyspnea.  CHADSVASC = 3.  He remains in atrial fibrillation this morning.  - He does not want to take an anti-arrhythmic medication. He was sent home from the hospital last year when he had afib on amiodarone but stopped it.  He does not want to restart amiodarone or any other anti-arrhythmic.  QTc is prolonged so not a good Tikosyn candidate anyway.  - In the past, he has refused to stay on an anticoagulant long-term.  After last year's cardioversion, he stopped Eliquis after 2 months.  We again discussed the lowering of CVA risk with NOAC use.  He is now back on Eliquis.  - Will plan TEE-guided DCCV today.  I discussed risks/benefits of procedure with patient and he agrees to proceed.  - Continue Toprol XL 50 mg bid.   - We discussed atrial fibrillation ablation again today.  I think this is reasonable with his symptomatic atrial fibrillation episodes.  I will refer him to EP after discharge.  2. HTN: BP high.  Continue Toprol XL and increase HCTZ to 25 mg daily.  Replace K.  3. Bicuspid aortic valve disorder: Moderate AS on 2/18 echo, stable.  Echo in 2017 showed ascending aorta 4.4 cm but MRA chest  showed maximal ascending aorta dimension 4.1 cm.  Will reassess aortic valve and aorta on TEE today.   Possible discharge home after cardioversion this afternoon.     Length of Stay: 0  [name], MD  10/28/2017, 9:40 AM  Advanced Heart Failure Team Pager [phone] (M-F; 7a - 4p)  Please contact [facility] for night-coverage after hours (4p -7a ) and weekends on [email or web address]

## 2017-10-28 NOTE — Transfer of Care (Signed)
Immediate Anesthesia Transfer of Care Note  Patient: [name] [name]  Procedure(s) Performed: TRANSESOPHAGEAL ECHOCARDIOGRAM (TEE) (N/A ) CARDIOVERSION (N/A )  Patient Location: Endoscopy Unit  Anesthesia Type:MAC  Level of Consciousness: awake and alert   Airway & Oxygen Therapy: Patient Spontanous Breathing and Patient connected to nasal cannula oxygen  Post-op Assessment: Report given to RN and Post -op Vital signs reviewed and stable  Post vital signs: Reviewed and stable  Last Vitals:  Vitals:   10/28/17 1130 10/28/17 1237  BP: (!) 153/109 94/60  Pulse:  72  Resp: 19 18  Temp: 36.6 C   SpO2: 99% 96%    Last Pain:  Vitals:   10/28/17 1237  TempSrc: Oral  PainSc:       Patients Stated Pain Goal: 0 (79/15/05 6979)  Complications: No apparent anesthesia complications

## 2017-10-28 NOTE — Procedures (Addendum)
Electrical Cardioversion Procedure Note [name] [name] [id number] 01/18/1941  Procedure: Electrical Cardioversion Indications:  Atrial Fibrillation  Procedure Details Consent: Risks of procedure as well as the alternatives and risks of each were explained to the (patient/caregiver).  Consent for procedure obtained. Time Out: Verified patient identification, verified procedure, site/side was marked, verified correct patient position, special equipment/implants available, medications/allergies/relevent history reviewed, required imaging and test results available.  Performed  Patient placed on cardiac monitor, pulse oximetry, supplemental oxygen as necessary.  Sedation given: Propofol per anesthesiology Pacer pads placed anterior and posterior chest.  Cardioverted 1 time(s).  Cardioverted at [place].  Evaluation Findings: Post procedure EKG shows: NSR Complications: None Patient did tolerate procedure well.   [name] 10/28/2017, 12:25 PM

## 2017-10-28 NOTE — CV Procedure (Signed)
Procedure: TEE  Indication: Atrial fibrillation.   Sedation: Propofol per anesthesiology  Findings: Please see echo section for full report.  Normal LV size with moderate LV hypertrophy.  EF 60-65%.  Normal wall motion.  Normal RV size and systolic function.  Moderate left atrial enlargement, no LA appendage thrombus.  Mild right atrial enlargement.  Trivial TR with peak RV-RA gradient 28 mmHg.  Mild MR.  Bicuspid, severely calcified aortic valve with moderate aortic stenosis.  Mean gradient 28 mmHg, 1.2 cm^2.  Aortic root measured 3.4 cm, 4.4 cm ascending aorta.    Moderate aortic stenosis with bicuspid valve, mildly dilated ascending aorta.   Proceed to DCCV.   [name] 10/28/2017 12:34 PM

## 2017-10-28 NOTE — Discharge Summary (Signed)
Advanced Heart Failure Discharge Note  Discharge Summary   Patient ID: [name] [name] MRN: [id number], DOB/AGE: August 27, 1941 77 y.o. Admit date: 10/27/2017 D/C date:     10/28/2017   Primary Discharge Diagnoses:  1. Atrial fibrillation: Paroxysmal s/p TEE/DCCV 10/28/17 2. HTN 3. Bicuspid aortic valve disorder s/p TEE 10/28/17  Hospital Course:   [name] [name] a 77 y.o.malewith Paroxysmal Afib, HTN, and bicuspid aortic valve disorder.   Presented to [facility] 10/27/17 with AFib RVR. Admitted overnight for loading on Eliquis prior to DCCV.  Pt education on high risk of stroke with AC non- compliance. Pt states he will continue Eliquis for now, and understands risk were he to stop.   Pt underwent TEE guided DCCV am of 10/28/17. Pt converted to NSR and thought stable for discharge afterward.   He will be seen in HF clinic for very close follow up as below. Pt knows to call back with any concerns or if he feels he goes back out of rhythm.   Discharge Weight Range: 195 lbs Discharge Vitals: Blood pressure 113/74, pulse 69, temperature 97.6 F (36.4 C), temperature source Oral, resp. rate 20, height 5\' 10"  (1.778 m), weight 195 lb (88.5 kg), SpO2 96 %.  Labs: Lab Results  Component Value Date   WBC 4.9 10/27/2017   HGB 16.0 10/27/2017   HCT 48.2 10/27/2017   MCV 91.5 10/27/2017   PLT 200 10/27/2017    Recent Labs  Lab 10/28/17 0556  NA 141  K 3.7  CL 106  CO2 22  BUN 14  CREATININE 0.83  CALCIUM 9.2  PROT 6.5  BILITOT 0.9  ALKPHOS 68  ALT 19  AST 26  GLUCOSE 101*   Lab Results  Component Value Date   CHOL 123 08/03/2016   HDL 24 (L) 08/03/2016   LDLCALC 71 08/03/2016   TRIG 142 08/03/2016   BNP (last 3 results) Recent Labs    10/27/17 1452  BNP 556.9*    ProBNP (last 3 results) No results for input(s): PROBNP in the last 8760 hours.   Diagnostic Studies/Procedures   Dg Chest 2 View  Result Date: 10/27/2017 CLINICAL DATA:  Cardiac arrhythmia.  Chest  pressure EXAM: CHEST  2 VIEW COMPARISON:  July 12, 2017 FINDINGS: There is minimal bibasilar scarring. Lungs elsewhere are clear. Heart size and pulmonary vascularity are normal. No adenopathy. There is aortic atherosclerosis. There is degenerative change in the thoracic spine. IMPRESSION: Slight bibasilar scarring. No edema or consolidation. Stable cardiac silhouette. There is aortic atherosclerosis. Aortic Atherosclerosis (ICD10-I70.0). Electronically Signed   By: Lowella Grip III M.D.   On: 10/27/2017 12:09    Discharge Medications   Allergies as of 10/28/2017      Reactions   Xarelto [rivaroxaban] Other (See Comments), Hypertension   INCREASED BP-HYPERTENSIVE EVENTS   Corticosteroids Other (See Comments)   Made the patient  "sick," feel "weird," and his "body rejected" them   Ramipril Other (See Comments)   Could not eat or sleep, lost muscle mass   Benazepril Cough      Medication List    STOP taking these medications   aspirin EC 81 MG tablet   ibuprofen 200 MG tablet Commonly known as:  ADVIL,MOTRIN     TAKE these medications   apixaban 5 MG Tabs tablet Commonly known as:  ELIQUIS Take 1 tablet (5 mg total) by mouth 2 (two) times daily.   hydrochlorothiazide 25 MG tablet Commonly known as:  HYDRODIURIL Take 1 tablet (25 mg total) by mouth  daily. Start taking on:  10/29/2017 What changed:    medication strength  how much to take   metoprolol succinate 50 MG 24 hr tablet Commonly known as:  TOPROL-XL TAKE 1 TABLET (50 MG TOTAL) BY MOUTH 2 (TWO) TIMES DAILY. TAKE WITH OR IMMEDIATELY FOLLOWING A MEAL.   potassium chloride SA 20 MEQ tablet Commonly known as:  K-DUR,KLOR-CON Take 1 tablet (20 mEq total) by mouth daily. Start taking on:  10/29/2017   Vitamin D3 2000 units capsule Take 1 capsule (2,000 Units total) by mouth daily.       Disposition   The patient will be discharged in stable condition to home. Discharge Instructions    (HEART FAILURE  PATIENTS) Call MD:  Anytime you have any of the following symptoms: 1) 3 pound weight gain in 24 hours or 5 pounds in 1 week 2) shortness of breath, with or without a dry hacking cough 3) swelling in the hands, feet or stomach 4) if you have to sleep on extra pillows at night in order to breathe.   Complete by:  As directed    Diet - low sodium heart healthy   Complete by:  As directed    Increase activity slowly   Complete by:  As directed    STOP any activity that causes chest pain, shortness of breath, dizziness, sweating, or exessive weakness   Complete by:  As directed      Follow-up Information    [facility]. Go on 11/07/2017.   Specialty:  Cardiology Why:  1030 AM for post hospital follow up. The code for parking is 9001 at Dr. Oleh Genin Office.  Contact information: [address] [phone] [facility] [place] [phone]           Duration of Discharge Encounter: Greater than 35 minutes   Signed, Annamaria Helling 10/28/2017, 1:07 PM

## 2017-10-31 ENCOUNTER — Encounter (HOSPITAL_COMMUNITY): Payer: Self-pay | Admitting: Cardiology

## 2017-11-01 NOTE — Anesthesia Postprocedure Evaluation (Signed)
Anesthesia Post Note  Patient: Alok Halter  Procedure(s) Performed: TRANSESOPHAGEAL ECHOCARDIOGRAM (TEE) (N/A ) CARDIOVERSION (N/A )     Patient location during evaluation: PACU Anesthesia Type: MAC Level of consciousness: awake and alert Pain management: pain level controlled Vital Signs Assessment: post-procedure vital signs reviewed and stable Respiratory status: spontaneous breathing, nonlabored ventilation, respiratory function stable and patient connected to nasal cannula oxygen Cardiovascular status: stable and blood pressure returned to baseline Postop Assessment: no apparent nausea or vomiting Anesthetic complications: no    Last Vitals:  Vitals:   10/28/17 1240 10/28/17 1250  BP: 94/60 113/74  Pulse: 70 69  Resp: 19 20  Temp:    SpO2: 95% 96%    Last Pain:  Vitals:   10/28/17 1237  TempSrc: Oral  PainSc:                  Barnet Glasgow

## 2017-11-01 NOTE — Addendum Note (Signed)
Addendum  created 11/01/17 1803 by Barnet Glasgow, MD   Sign clinical note

## 2017-11-07 ENCOUNTER — Ambulatory Visit (HOSPITAL_COMMUNITY)
Admit: 2017-11-07 | Discharge: 2017-11-07 | Disposition: A | Discharge status: Home / Self Care | Payer: Medicare Other | Attending: Cardiology | Admitting: Cardiology

## 2017-11-07 VITALS — BP 146/76 | HR 56 | Wt 203.0 lb

## 2017-11-07 DIAGNOSIS — Z7901 Long term (current) use of anticoagulants: Secondary | ICD-10-CM | POA: Diagnosis not present

## 2017-11-07 DIAGNOSIS — I7121 Aneurysm of the ascending aorta, without rupture: Secondary | ICD-10-CM

## 2017-11-07 DIAGNOSIS — M1990 Unspecified osteoarthritis, unspecified site: Secondary | ICD-10-CM | POA: Insufficient documentation

## 2017-11-07 DIAGNOSIS — Z79899 Other long term (current) drug therapy: Secondary | ICD-10-CM | POA: Insufficient documentation

## 2017-11-07 DIAGNOSIS — I712 Thoracic aortic aneurysm, without rupture, unspecified: Secondary | ICD-10-CM

## 2017-11-07 DIAGNOSIS — I350 Nonrheumatic aortic (valve) stenosis: Secondary | ICD-10-CM | POA: Insufficient documentation

## 2017-11-07 DIAGNOSIS — Q231 Congenital insufficiency of aortic valve: Secondary | ICD-10-CM | POA: Diagnosis not present

## 2017-11-07 DIAGNOSIS — E7800 Pure hypercholesterolemia, unspecified: Secondary | ICD-10-CM | POA: Diagnosis not present

## 2017-11-07 DIAGNOSIS — I480 Paroxysmal atrial fibrillation: Secondary | ICD-10-CM | POA: Insufficient documentation

## 2017-11-07 DIAGNOSIS — I10 Essential (primary) hypertension: Secondary | ICD-10-CM | POA: Insufficient documentation

## 2017-11-07 LAB — BASIC METABOLIC PANEL
Anion gap: 9 (ref 5–15)
BUN: 12 mg/dL (ref 6–20)
CO2: 26 mmol/L (ref 22–32)
Calcium: 9 mg/dL (ref 8.9–10.3)
Chloride: 104 mmol/L (ref 101–111)
Creatinine, Ser: 0.81 mg/dL (ref 0.61–1.24)
GFR calc Af Amer: >60 mL/min (ref >60)
GFR calc non Af Amer: >60 mL/min (ref >60)
Glucose, Bld: 94 mg/dL (ref 65–99)
Potassium: 3.4 mmol/L — ABNORMAL LOW (ref 3.5–5.1)
Sodium: 139 mmol/L (ref 135–145)

## 2017-11-07 NOTE — Patient Instructions (Signed)
Lab today  MRA of chest to evaluate your aneurysm  We will contact you in 6 months to schedule your next appointment.

## 2017-11-08 ENCOUNTER — Encounter (HOSPITAL_COMMUNITY): Payer: Medicare Other

## 2017-11-08 NOTE — Progress Notes (Signed)
Patient ID: [name] [name], male   DOB: Jun 24, 1941, 77 y.o.   MRN: [id number] PCP: Dr. Alain Marion Cardiology: Dr. Aundra Dubin  77 yo with history of HTN and paroxysmal atrial fibrillation returns for followup. He was hospitalized with atrial fibrillation/RVR in 5/14.  He had TEE-guided cardioversion.  TEE showed bicuspid aortic valve with mild AS and a moderately dilated ascending aorta.  Most recent echo in 4/17 showed moderate aortic stenosis and MRA chest in 11/17 showed 4.1 cm ascending aorta.   He was on Xarelto for anticoagulation but stopped it as he was convinced it was causing joint pains.  He then refused to start any other anticoagulation.    In 5/17, he had been back in atrial fibrillation for several days and was symptomatic.  I started him on Eliquis and planned TEE-guided DCCV given significant symptoms, but he converted back to NSR on his own.  He continued Eliquis for about 1 month then stopped it on his own.    In 11/17, he was hospitalized with symptomatic atrial fibrillation with RVR.  I started him on diltiazem CD and Eliquis with plan for TEE-guided DCCV.  However, he converted back to NSR on his own. He stopped the diltiazem but has continued the Eliquis.   He was admitted in 2/18 with fever, LUL PNA and left-sided pleural effusion.  Thoracentesis on left was suggestive of parapneumonic effusion.  He was in the hospital 11 days.  During that time, he went into atrial fibrillation with RVR.  He was started on amiodarone and went back into NSR.  Amiodarone was subsequently stopped.   Recurrent atrial fibrillation with RVR in 2/19, felt more fatigued.  He underwent TEE-guided DCCV back to NSR. He remains in NSR today.    He returns for followup of atrial fibrillation.  He is in NSR today as above.  Symptomatically back to baseline.  He is now taking Eliquis (had stopped it before the most recent admission).  Main limitation now is low back pain.  He has to stop walking after 300-400 feet  due to LBP.  No significant dyspnea or fatigue.    ECG (personally reviewed): NSR, inferolateral TWIs.    Labs (5/14): K 3.7, creatinine 0.9, BNP 2261=>109, TSH normal Labs (7/14): K 3.5, creatinine 1.0 Labs (12/14): K 3.8, creatinine 1.0, LDL particle number 1374, LDL 107, TSH normal Labs (6/16): TSH normal, K 3.8, creatinine 0.84, HCT 42.4, LDL 85, LFTs normal Labs (3/17): K 3.8, creatinine 0.87 Labs (5/17): K 4.5, creatinine 0.97, HCT 43.7 Labs (11/17): K 3.2, creatinine 0.81, LDL 71, HDL 24 Labs (2/18): K 3.8, creatinine 0.89 Labs (2/19): K 3.7, creatinine 0.83, hgb 16  Allergies (verified):  No Known Drug Allergies   Past Medical History:  1. Hypertension: ACEI cough.  2. Atrial fibrillation. The patient had new-onset atrial fibrillation in November 2009. He underwent ibutilide cardioversion successfully. He has had 1-2 episodes/year that are short-lived that likely are atrial fibrillation with RVR. He was admitted in 5/14 with atrial fibrillation/RVR and had TEE-guided DCCV.  Atrial fibrillation again in 5/17, converted back to NSR spontaneously.  CHADSVASC score 2.  - Atrial fibrillation 2/19 with TEE-guided DCCV.  3. Hypercholesterolemia.  4. Bicuspid aortic valve disorder: Echo (7/11): EF 55-60%, mild LV hypertrophy, mild aortic stenosis with mean gradient 19 mmHg and peak gradient 36 mmHg.  TEE (5/14): EF 55%, mild LVH, bicuspid aortic valve with mild AS (mean gradient 13 mmHg), ascending aorta 4.5 cm.  Echo (9/15) with EF 65-70%, moderate AS (mean  gradient 23 mmHg), mild AI, ascending aorta 4.1 cm, mild MR.   - Echo (4/17) with EF 65-70%, moderate aortic stenosis with mean gradient 27 mmHg, PASP 31 mmHg, ascending aorta 4.4 cm.  - Echo (2/18) with EF 55-60%, bicuspid aortic valve with moderate aortic stenosis (underestimated gradient).  - TEE (2/19): EF 60-65%, moderate LVH, bicuspid aortic valve with moderate AS with mean gradient 28 mmHg and AVA 1.2 cm^2, 4.4 cm ascending aorta.   5. Osteoarthritis.  6. Low back pain.  7. Chest pain: ETT-myoview (12/11) with 10:51 exercise, no chest pain, no significant ST changes, EF 69%, no evidence for ischemia or infarction.  8. Ascending aortic aneurysm: Associated with bicuspid aortic valve.  4.5 cm by TEE in 5/14.   MRA chest (6/14) with bicuspid aortic valve, 4.3 cm ascending aortic aneurysm.  MRA chest (10/15) with 4.2 cm ascending aorta (bicuspid aortic valve noted).  Echo (4/17) with ascending aorta diameter 4.4 cm.  - MRA chest (11/17) with 4.1 cm ascending aorta.  - Echo (2/19): 4.4 cm ascending aorta.   Family History:  Hypertension. No early CAD.   Social History:  The patient lives in [place]. He is a native of [place]. He is retired Chief Financial Officer at [employer]. He drinks a glass of wine nightly. He is a nonsmoker. He is a former Microbiologist.   Review of Systems  All systems reviewed and negative except as per HPI.   Current Outpatient Medications  Medication Sig Dispense Refill  . apixaban (ELIQUIS) 5 MG TABS tablet Take 1 tablet (5 mg total) by mouth 2 (two) times daily. 60 tablet 6  . hydrochlorothiazide (HYDRODIURIL) 25 MG tablet Take 1 tablet (25 mg total) by mouth daily. 30 tablet 6  . metoprolol succinate (TOPROL-XL) 50 MG 24 hr tablet TAKE 1 TABLET (50 MG TOTAL) BY MOUTH 2 (TWO) TIMES DAILY. TAKE WITH OR IMMEDIATELY FOLLOWING A MEAL. 180 tablet 3  . potassium chloride SA (K-DUR,KLOR-CON) 20 MEQ tablet Take 1 tablet (20 mEq total) by mouth daily. 30 tablet 6   No current facility-administered medications for this encounter.     BP (!) 146/76   Pulse (!) 56   Wt 203 lb (92.1 kg)   SpO2 98%   BMI 29.13 kg/m  General: NAD Neck: No JVD, no thyromegaly or thyroid nodule.  Lungs: Clear to auscultation bilaterally with normal respiratory effort. CV: Nondisplaced PMI.  Heart regular S1/S2, no S3/S4, 3/6 crescendo-decrescendo murmur RUSB.  1+ ankle edema.   Abdomen: Soft, nontender, no  hepatosplenomegaly, no distention.  Skin: Intact without lesions or rashes.  Neurologic: Alert and oriented x 3.  Psych: Normal affect. Extremities: No clubbing or cyanosis.  HEENT: Normal.   Assessment/Plan: 1. Atrial fibrillation: Paroxysmal. He is quite symptomatic when in atrial fibrillation.  CHADSVASC = 3.  Recent TEE-guided DCCV, he remains in NSR.  - He does not want to take an anti-arrhythmic medication.   - In the past, he has refused to stay on an anticoagulant long-term. I again told him that he needs to stay on apixaban for at least a month post-DCCV though I would like him to continue it long-term. - We discussed atrial fibrillation ablation again.  I think this is reasonable with more frequent symptomatic atrial fibrillation episodes, especially as he does not want an anti-arrhythmic.  He is not interested at this point.   - Continue Toprol XL.  2. HTN: BP high.  He refuses an additional medication.  We discussed the importance of  BP control to stay out of atrial fibrillation and prevent enlargement of ascending aorta aneurysm, but he still does not want to take an additional med.  - BMET today given HCTZ use.  3. Bicuspid aortic valve disorder: Moderate AS on 2/19 TEE, stable.  He has a moderately dilated ascending aorta to 4.4 cm on TEE in 2/19.  I suggested MRA chest to better evaluate this, but he does not want to do an MRA.   Followup with me in 6 months  [name] 11/08/2017

## 2017-11-10 ENCOUNTER — Other Ambulatory Visit: Payer: Self-pay | Admitting: Internal Medicine

## 2017-11-10 ENCOUNTER — Telehealth (HOSPITAL_COMMUNITY): Payer: Self-pay | Admitting: *Deleted

## 2017-11-10 NOTE — Telephone Encounter (Signed)
Result Notes for Basic metabolic panel   Notes recorded by Darron Doom, RN on 11/10/2017 at 10:55 AM EST Patient called back and is aware and agreeable with dietary changes. No further questions. ------  Notes recorded by Harvie Junior, CMA on 11/09/2017 at 9:22 AM EST Left VM for pt to call for lab results. ------  Notes recorded by Larey Dresser, MD on 11/08/2017 at 10:18 PM EST Increase dietary K intake.

## 2017-11-14 ENCOUNTER — Ambulatory Visit (INDEPENDENT_AMBULATORY_CARE_PROVIDER_SITE_OTHER): Payer: Medicare Other | Admitting: Internal Medicine

## 2017-11-14 ENCOUNTER — Encounter: Payer: Self-pay | Admitting: Internal Medicine

## 2017-11-14 VITALS — BP 136/74 | HR 58 | Ht 70.0 in | Wt 201.0 lb

## 2017-11-14 DIAGNOSIS — I4819 Other persistent atrial fibrillation: Secondary | ICD-10-CM

## 2017-11-14 DIAGNOSIS — I481 Persistent atrial fibrillation: Secondary | ICD-10-CM | POA: Diagnosis not present

## 2017-11-14 DIAGNOSIS — I480 Paroxysmal atrial fibrillation: Secondary | ICD-10-CM

## 2017-11-14 NOTE — Progress Notes (Signed)
Electrophysiology Office Note   Date:  11/14/2017   ID:  [name] [name], DOB Feb 05, 1941, MRN [id number]  PCP:  Cassandria Anger, MD  Cardiologist:  Dr Aundra Dubin Primary Electrophysiologist: Thompson Grayer, MD    CC: afib   History of Present Illness: [name] [name] is a 77 y.o. male who presents today for electrophysiology evaluation.   He is referred by Dr Aundra Dubin for EP consultation regarding afib.  He has had atrial fibrillation since at least 2014 with multiple prior cardioversions.  He has previously tried amiodarone but does not feel that he tolerated this.  afib has been reasonably controlled off of AAD therapy.  Most recently, he presented with persistent afib 10/27/17 after not having afib for nearly a year.  He was placed on eliquis and cardioverted.  He remains in sinus rhythm at this time. His primary concern today is with a polyp for which he has plans to have surgical removal at [facility] in May.  Today, he denies symptoms of palpitations, chest pain, shortness of breath, orthopnea, PND, lower extremity edema, claudication, dizziness, presyncope, syncope, bleeding, or neurologic sequela. The patient is tolerating medications without difficulties and is otherwise without complaint today.    Past Medical History:  Diagnosis Date  . Adenomatous colon polyp   . Aortic stenosis, mild    Echo 07/11: 55-60%, mild LV hypertrophy, mild aortic stenosis w/mean gradient 25mmHg and peak gradient 36 mmHg.  . Ascending aortic aneurysm ([place])   . Bicuspid aortic valve   . Chest pain    ETT-myoview 12/11 w/exercise, no chest pain, no significant ST changes, EF 69%, no evidence for ischemia or infarction.  . CHF NYHA class I (no symptoms from ordinary activities), acute, diastolic ([place])   . Hemorrhoids   . HTN (hypertension)   . Hypercholesteremia   . LBP (low back pain)   . Moderate aortic stenosis   . Osteoarthritis   . Paroxysmal atrial fibrillation ([place])    a. new onset Afib in 07/2008. He  underwent ibutilide cardioversion successfully. b. Recurrence 01/2013 s/p TEE/DCCV - was on Xarelto but he stopped it as he was convinced it was causing joint pn. c. Recurrence 01/2016 - spont conv to NSR. Pt took Eliquis x1 mo then declined further anticoag. d. Recurrence 07/2016.  [name] Pneumonia    Past Surgical History:  Procedure Laterality Date  . BACK SURGERY  x12 years ago  . CARDIOVERSION N/A 01/26/2013   Procedure: CARDIOVERSION;  Surgeon: Larey Dresser, MD;  Location: [facility] ENDOSCOPY;  Service: Cardiovascular;  Laterality: N/A;  . CARDIOVERSION N/A 10/28/2017   Procedure: CARDIOVERSION;  Surgeon: Larey Dresser, MD;  Location: [facility] ENDOSCOPY;  Service: Cardiovascular;  Laterality: N/A;  . COLONOSCOPY    . HEMORRHOID SURGERY    . LUMBAR LAMINECTOMY    . POLYPECTOMY    . TEE WITHOUT CARDIOVERSION N/A 01/26/2013   Procedure: TRANSESOPHAGEAL ECHOCARDIOGRAM (TEE);  Surgeon: Larey Dresser, MD;  Location: [place] [facility];  Service: Cardiovascular;  Laterality: N/A;  . TEE WITHOUT CARDIOVERSION N/A 10/28/2017   Procedure: TRANSESOPHAGEAL ECHOCARDIOGRAM (TEE);  Surgeon: Larey Dresser, MD;  Location: [facility] ENDOSCOPY;  Service: Cardiovascular;  Laterality: N/A;     Current Outpatient Medications  Medication Sig Dispense Refill  . apixaban (ELIQUIS) 5 MG TABS tablet Take 1 tablet (5 mg total) by mouth 2 (two) times daily. 60 tablet 6  . hydrochlorothiazide (HYDRODIURIL) 25 MG tablet Take 1 tablet (25 mg total) by mouth daily. 30 tablet 6  . metoprolol succinate (TOPROL-XL)  50 MG 24 hr tablet TAKE 1 TABLET (50 MG TOTAL) BY MOUTH 2 (TWO) TIMES DAILY. TAKE WITH OR IMMEDIATELY FOLLOWING A MEAL. 180 tablet 3  . potassium chloride SA (K-DUR,KLOR-CON) 20 MEQ tablet Take 1 tablet (20 mEq total) by mouth daily. 30 tablet 6   No current facility-administered medications for this visit.     Allergies:   Xarelto [rivaroxaban]; Corticosteroids; Ramipril; and Benazepril   Social History:  The patient  reports  that  has never smoked. he has never used smokeless tobacco. He reports that he drinks alcohol. He reports that he does not use drugs.   Family History:  The patient's  family history includes Colon cancer (age of onset: 36) in his mother; Hypertension in his other.    ROS:  Please see the history of present illness.   All other systems are personally reviewed and negative.    PHYSICAL EXAM: VS:  BP 136/74   Pulse (!) 58   Ht 5\' 10"  (1.778 m)   Wt 201 lb (91.2 kg)   BMI 28.84 kg/m  , BMI Body mass index is 28.84 kg/m. GEN: Well nourished, well developed, in no acute distress  HEENT: normal  Neck: no JVD, carotid bruits, or masses Cardiac: RRR; no murmurs, rubs, or gallops,no edema  Respiratory:  clear to auscultation bilaterally, normal work of breathing GI: soft, nontender, nondistended, + BS MS: no deformity or atrophy  Skin: warm and dry  Neuro:  Strength and sensation are intact Psych: euthymic mood, full affect  EKG:  EKG is ordered today. The ekg ordered today is personally reviewed and shows sinus rhythm 58 bpm, PR 196 msec, QRS 94 msec, Qtc 435 msec, nonspecific ST/T changes   Recent Labs: 12/23/2016: TSH 4.26 10/27/2017: B Natriuretic Peptide 556.9; Hemoglobin 16.0; Magnesium 1.8; Platelets 200 10/28/2017: ALT 19 11/07/2017: BUN 12; Creatinine, Ser 0.81; Potassium 3.4; Sodium 139  personally reviewed   Lipid Panel     Component Value Date/Time   CHOL 123 08/03/2016 0159   TRIG 142 08/03/2016 0159   HDL 24 (L) 08/03/2016 0159   CHOLHDL 5.1 08/03/2016 0159   VLDL 28 08/03/2016 0159   LDLCALC 71 08/03/2016 0159   LDLDIRECT 151.5 11/20/2007 1623   personally reviewed   Wt Readings from Last 3 Encounters:  11/14/17 201 lb (91.2 kg)  11/07/17 203 lb (92.1 kg)  10/28/17 195 lb (88.5 kg)      Other studies personally reviewed: Additional studies/ records that were reviewed today include: CHF clinic notes, recent cardioversion, prior echo, prior MRI  Review of  the above records today demonstrates: as above  ASSESSMENT AND PLAN:  1.  Persistent afib The patient has symptomatic afib.  He feels that he has not tolerated amiodarone.  Qtc today is < 440 msec suggesting that he may be a candidate for tikosyn should his afib progress.  Given infrequent nature, he is not interested in daily medicine or ablation at this time. He may be willing to reconsider should his afib progress.  I would agree with this approach. chads2vascs core is 3.  Continue eliquis long term.   Follow-up:  As needed  Current medicines are reviewed at length with the patient today.   The patient does not have concerns regarding his medicines.  The following changes were made today:  none    Signed, Thompson Grayer, MD  11/14/2017 3:34 PM     [address] [place] [address] [phone] (office) [phone] (fax)

## 2017-11-14 NOTE — Patient Instructions (Addendum)
Medication Instructions:  Your physician recommends that you continue on your current medications as directed. Please refer to the Current Medication list given to you today.  Labwork: None ordered.  Testing/Procedures: None ordered.  Follow-Up: Your physician wants you to follow-up as needed with Dr. Allred.  Any Other Special Instructions Will Be Listed Below (If Applicable).  If you need a refill on your cardiac medications before your next appointment, please call your pharmacy.   

## 2017-11-21 ENCOUNTER — Ambulatory Visit: Payer: Medicare Other | Admitting: Neurology

## 2017-11-25 ENCOUNTER — Telehealth: Payer: Self-pay

## 2017-11-25 NOTE — Telephone Encounter (Signed)
   [facility] Pre-operative Risk Assessment    Request for surgical clearance:  1. What type of surgery is being performed? Lumbar ESI Injection   2. When is this surgery scheduled? 12/06/17   3. What type of clearance is required (medical clearance vs. Pharmacy clearance to hold med vs. Both)? Medical and Pharmacy Both  4. Are there any medications that need to be held prior to surgery and how long? Eliquis for 3 days prior to injection   5. Practice name and name of physician performing surgery? [facility]   6. What is your office phone and fax number? Phone- [phone] ext [phone], [phone]   7. Anesthesia type (None, local, MAC, general) ? Local

## 2017-11-28 NOTE — Telephone Encounter (Signed)
   Primary Cardiologist: No primary care provider on file.  Chart reviewed as part of pre-operative protocol coverage. Given past medical history and time since last visit, based on ACC/AHA guidelines, Kele Debroux would be at acceptable risk for the planned procedure without further cardiovascular testing.   I will route this recommendation to the requesting party via Epic fax function and remove from pre-op pool. Will route to Pharmacy for their clearance  Jory Sims DNP, ANP, AACC  Please call with questions.  11/28/2017, 5:37 PM

## 2017-11-29 NOTE — Telephone Encounter (Signed)
Patient with diagnosis of AFib on Eliquis for anticoagulation.    Procedure: Lumbar Injection Date of procedure: 12/06/2017  CHADS2-VASc score of  4 (CHF, HTN, AGEX2)  CrCl = 79 ml/min (IBW used)  Per office protocol, patient can hold Eliquis for 3 days prior to procedure.     Clearnce Leja Rodriguez-Guzman PharmD, BCPS, [place] [address] 11/29/2017 7:48 AM

## 2017-11-29 NOTE — Telephone Encounter (Signed)
Notification faxed via Epic

## 2017-12-02 DIAGNOSIS — D122 Benign neoplasm of ascending colon: Secondary | ICD-10-CM | POA: Insufficient documentation

## 2017-12-06 DIAGNOSIS — M5136 Other intervertebral disc degeneration, lumbar region: Secondary | ICD-10-CM | POA: Insufficient documentation

## 2018-01-09 ENCOUNTER — Other Ambulatory Visit: Payer: Self-pay | Admitting: Cardiology

## 2018-01-09 DIAGNOSIS — I359 Nonrheumatic aortic valve disorder, unspecified: Secondary | ICD-10-CM

## 2018-01-09 DIAGNOSIS — I480 Paroxysmal atrial fibrillation: Secondary | ICD-10-CM

## 2018-01-11 ENCOUNTER — Ambulatory Visit (INDEPENDENT_AMBULATORY_CARE_PROVIDER_SITE_OTHER): Payer: Medicare Other | Admitting: Internal Medicine

## 2018-01-11 ENCOUNTER — Encounter: Payer: Self-pay | Admitting: Internal Medicine

## 2018-01-11 VITALS — BP 138/82 | HR 61 | Temp 97.9°F | Ht 70.0 in | Wt 201.0 lb

## 2018-01-11 DIAGNOSIS — M79605 Pain in left leg: Secondary | ICD-10-CM | POA: Diagnosis not present

## 2018-01-11 DIAGNOSIS — I480 Paroxysmal atrial fibrillation: Secondary | ICD-10-CM

## 2018-01-11 DIAGNOSIS — R1012 Left upper quadrant pain: Secondary | ICD-10-CM | POA: Diagnosis not present

## 2018-01-11 DIAGNOSIS — D122 Benign neoplasm of ascending colon: Secondary | ICD-10-CM

## 2018-01-11 DIAGNOSIS — K635 Polyp of colon: Secondary | ICD-10-CM | POA: Insufficient documentation

## 2018-01-11 DIAGNOSIS — M79604 Pain in right leg: Secondary | ICD-10-CM

## 2018-01-11 MED ORDER — VITAMIN D3 50 MCG (2000 UT) PO CAPS: 2000.0000 [IU] | ORAL_CAPSULE | Freq: Every day | ORAL | 3 refills | Status: DC

## 2018-01-11 NOTE — Assessment & Plan Note (Signed)
?  etiology x1 year CXR ok 4/19 Chest CT 2/18  Discussed. Abd [place]

## 2018-01-11 NOTE — Assessment & Plan Note (Signed)
Eliquis, Toprol

## 2018-01-11 NOTE — Assessment & Plan Note (Addendum)
Chronic pain in B legs Arterial doppler US

## 2018-01-11 NOTE — Assessment & Plan Note (Signed)
3 cm polyp - hepatic flexure The pt was referred to [facility] GI

## 2018-01-11 NOTE — Progress Notes (Signed)
Subjective:  Patient ID: [name] [name], male    DOB: [birth year]  Age: 77 y.o. MRN: [id number]  CC: No chief complaint on file.   HPI [name] [name] presents for a nagging LUQ abd pain, hip pain, B leg pain - hard to walk..he is to have a colonoscopy at [facility]  Outpatient Medications Prior to Visit  Medication Sig Dispense Refill  . apixaban (ELIQUIS) 5 MG TABS tablet Take 1 tablet (5 mg total) by mouth 2 (two) times daily. 60 tablet 6  . hydrochlorothiazide (MICROZIDE) 12.5 MG capsule TAKE 1 CAPSULE (12.5 MG TOTAL) BY MOUTH DAILY. 90 capsule 3  . metoprolol succinate (TOPROL-XL) 50 MG 24 hr tablet TAKE 1 TABLET (50 MG TOTAL) BY MOUTH 2 (TWO) TIMES DAILY. TAKE WITH OR IMMEDIATELY FOLLOWING A MEAL. 180 tablet 3  . potassium chloride SA (K-DUR,KLOR-CON) 20 MEQ tablet Take 1 tablet (20 mEq total) by mouth daily. 30 tablet 6   No facility-administered medications prior to visit.     ROS Review of Systems  Constitutional: Positive for fatigue. Negative for appetite change and unexpected weight change.  HENT: Negative for congestion, nosebleeds, sneezing, sore throat and trouble swallowing.   Eyes: Negative for itching and visual disturbance.  Respiratory: Negative for cough.   Cardiovascular: Negative for chest pain, palpitations and leg swelling.  Gastrointestinal: Positive for abdominal pain. Negative for abdominal distention, blood in stool, diarrhea and nausea.  Genitourinary: Negative for frequency and hematuria.  Musculoskeletal: Positive for arthralgias, back pain and gait problem. Negative for joint swelling and neck pain.  Skin: Negative for rash.  Neurological: Positive for weakness. Negative for dizziness, tremors and speech difficulty.  Psychiatric/Behavioral: Negative for agitation, dysphoric mood and sleep disturbance. The patient is nervous/anxious.     Objective:  BP 138/82 (BP Location: Left Arm, Patient Position: Sitting, Cuff Size: Normal)   Pulse 61   Temp 97.9 F  (36.6 C) (Oral)   Ht 5\' 10"  (1.778 m)   Wt 201 lb (91.2 kg)   SpO2 98%   BMI 28.84 kg/m   BP Readings from Last 3 Encounters:  01/11/18 138/82  11/14/17 136/74  11/07/17 (!) 146/76    Wt Readings from Last 3 Encounters:  01/11/18 201 lb (91.2 kg)  11/14/17 201 lb (91.2 kg)  11/07/17 203 lb (92.1 kg)    Physical Exam  Constitutional: He is oriented to person, place, and time. He appears well-developed. No distress.  NAD  HENT:  Mouth/Throat: Oropharynx is clear and moist.  Eyes: Pupils are equal, round, and reactive to light. Conjunctivae are normal.  Neck: Normal range of motion. No JVD present. No thyromegaly present.  Cardiovascular: Normal rate, regular rhythm, normal heart sounds and intact distal pulses. Exam reveals no gallop and no friction rub.  No murmur heard. Pulmonary/Chest: Effort normal and breath sounds normal. No respiratory distress. He has no wheezes. He has no rales. He exhibits no tenderness.  Abdominal: Soft. Bowel sounds are normal. He exhibits no distension and no mass. There is no tenderness. There is no rebound and no guarding.  Musculoskeletal: Normal range of motion. He exhibits no edema or tenderness.  Lymphadenopathy:    He has no cervical adenopathy.  Neurological: He is alert and oriented to person, place, and time. He has normal reflexes. No cranial nerve deficit. He exhibits normal muscle tone. He displays a negative Romberg sign. Coordination and gait normal.  Skin: Skin is warm and dry. No rash noted.  Psychiatric: He has a normal mood and affect.  His behavior is normal. Judgment and thought content normal.    Lab Results  Component Value Date   WBC 4.9 10/27/2017   HGB 16.0 10/27/2017   HCT 48.2 10/27/2017   PLT 200 10/27/2017   GLUCOSE 94 11/07/2017   CHOL 123 08/03/2016   TRIG 142 08/03/2016   HDL 24 (L) 08/03/2016   LDLDIRECT 151.5 11/20/2007   LDLCALC 71 08/03/2016   ALT 19 10/28/2017   AST 26 10/28/2017   NA 139 11/07/2017     K 3.4 (L) 11/07/2017   CL 104 11/07/2017   CREATININE 0.81 11/07/2017   BUN 12 11/07/2017   CO2 26 11/07/2017   TSH 4.26 12/23/2016   PSA 2.76 03/04/2015   INR 1.14 08/02/2016   HGBA1C 5.7 (H) 08/02/2016    No results found.  Assessment & Plan:   There are no diagnoses linked to this encounter. I am having Herbie Eggert maintain his apixaban, potassium chloride SA, hydrochlorothiazide, and metoprolol succinate.  No orders of the defined types were placed in this encounter.    Follow-up: No follow-ups on file.  [name], MD

## 2018-01-12 NOTE — Addendum Note (Signed)
Addended by: Karren Cobble on: 01/12/2018 11:31 AM   Modules accepted: Orders

## 2018-01-13 ENCOUNTER — Telehealth: Payer: Self-pay

## 2018-01-13 DIAGNOSIS — M79604 Pain in right leg: Secondary | ICD-10-CM

## 2018-01-13 DIAGNOSIS — M79605 Pain in left leg: Principal | ICD-10-CM

## 2018-01-13 NOTE — Telephone Encounter (Signed)
ABI ordered.  

## 2018-01-13 NOTE — Telephone Encounter (Signed)
-----   Message from Rufina Falco sent at 01/12/2018  1:46 PM EDT ----- Regarding: Additional Order Needed It's me again!  Will you have Dr. Alain Marion enter an order for ABI's as well?  Thank you,  [place]

## 2018-01-16 ENCOUNTER — Telehealth: Payer: Self-pay | Admitting: Internal Medicine

## 2018-01-16 MED ORDER — NA SULFATE-K SULFATE-MG SULF 17.5-3.13-1.6 GM/177ML PO SOLN: 1.0000 | Freq: Once | ORAL | 0 refills | Status: AC

## 2018-01-16 NOTE — Telephone Encounter (Signed)
Pt states he spoke with [facility] and was told to have our office call in the prep that he used for his last colonoscopy as it worked well for him. Discussed with pt that he had suprep and he should call [facility] back and let them prescribe it for him as they may have different instructions for him to follow than what is on the box. Pt very argumentative and states he does not see why the prep cannot be prescribed here. Prep sent to pharmacy but pt was instructed to call [facility] to make sure they do not have different instructions for him than what are on the box. Pt verbalized understanding.

## 2018-01-17 ENCOUNTER — Ambulatory Visit (HOSPITAL_COMMUNITY)
Admission: RE | Admit: 2018-01-17 | Discharge: 2018-01-17 | Disposition: A | Discharge status: Home / Self Care | Payer: Medicare Other | Source: Ambulatory Visit | Attending: Vascular Surgery | Admitting: Vascular Surgery

## 2018-01-17 DIAGNOSIS — M79604 Pain in right leg: Secondary | ICD-10-CM | POA: Diagnosis not present

## 2018-01-17 DIAGNOSIS — M79605 Pain in left leg: Secondary | ICD-10-CM | POA: Insufficient documentation

## 2018-01-19 ENCOUNTER — Encounter (INDEPENDENT_AMBULATORY_CARE_PROVIDER_SITE_OTHER): Payer: Self-pay

## 2018-01-27 ENCOUNTER — Ambulatory Visit
Admission: RE | Admit: 2018-01-27 | Discharge: 2018-01-27 | Disposition: A | Discharge status: Home / Self Care | Payer: Medicare Other | Source: Ambulatory Visit | Attending: Internal Medicine | Admitting: Internal Medicine

## 2018-01-27 DIAGNOSIS — R1012 Left upper quadrant pain: Secondary | ICD-10-CM

## 2018-02-01 ENCOUNTER — Encounter (INDEPENDENT_AMBULATORY_CARE_PROVIDER_SITE_OTHER): Payer: Self-pay

## 2018-02-03 DIAGNOSIS — I10 Essential (primary) hypertension: Secondary | ICD-10-CM | POA: Diagnosis not present

## 2018-02-03 DIAGNOSIS — D123 Benign neoplasm of transverse colon: Secondary | ICD-10-CM | POA: Diagnosis not present

## 2018-02-03 DIAGNOSIS — Z7901 Long term (current) use of anticoagulants: Secondary | ICD-10-CM | POA: Diagnosis not present

## 2018-02-03 DIAGNOSIS — K648 Other hemorrhoids: Secondary | ICD-10-CM | POA: Diagnosis not present

## 2018-02-03 DIAGNOSIS — I4891 Unspecified atrial fibrillation: Secondary | ICD-10-CM | POA: Diagnosis not present

## 2018-02-03 DIAGNOSIS — E785 Hyperlipidemia, unspecified: Secondary | ICD-10-CM | POA: Diagnosis not present

## 2018-02-03 DIAGNOSIS — Z79899 Other long term (current) drug therapy: Secondary | ICD-10-CM | POA: Diagnosis not present

## 2018-02-03 LAB — HM COLONOSCOPY

## 2018-02-09 ENCOUNTER — Telehealth: Payer: Self-pay | Admitting: *Deleted

## 2018-02-09 NOTE — Telephone Encounter (Signed)
Patient has been scheduled for an appointment with Dr Nadeen Landau at [facility] on 02/10/18 at 4:00 pm. Per Lelon Frohlich at [place], patient has already been made aware of this appointment. 33 pages of records have already been sent over to [place].

## 2018-02-10 ENCOUNTER — Ambulatory Visit: Payer: Self-pay | Admitting: Surgery

## 2018-02-10 DIAGNOSIS — K635 Polyp of colon: Secondary | ICD-10-CM | POA: Diagnosis not present

## 2018-02-10 NOTE — H&P (Signed)
CC: Referral by Dr. Hilarie Fredrickson for endoscopically unresectable polyp at the hepatic flexure  HPI: Mr. Evitts is a very pleasant 77 year old woman with a history of atrial fibrillation, hypertension, hyperlipidemia who underwent a colonoscopy by Dr. Hilarie Fredrickson for screening purposes on 10/21/17 where he had: 3 mm cecal polyp-TA 2 mm ICV polyp-adenomatous Proximal ascending colon polyp 4 mm polyp removed 25-30 mm polyp at the hepatic flexure-biopsied-TA-tattooed 2 distal to polyp Transverse colon polyp 3 mm-TA Rectal polyp 4 mm removed  He denies any symptoms related to this. He was referred to [facility] where last week he underwent attempted EMR. The central portion of the lesion was unable to be lifted and therefore was not resected. Biopsies were taken but had not been made available to [place] as of yet. He does report a history as a child of "appendicitis" that was managed nonoperatively with a two-week hospitalization  PMH: Hypertension (well controlled on oral antihypertensive), atrial fibrillation (controlled with beta blocker and Eliquis), hyperlipidemia (controlled diet)  PSH: Denies any prior abdominal surgeries; has had back surgery  FHx: Mother had colon cancer in her 34s and died from this  Social: Denies use of tobacco/drugs; social EtOH use  ROS: A comprehensive 10 system review of systems was completed with the patient and pertinent findings as noted above.  The patient is a 77 year old male.   Past Surgical History Sabino Gasser; 02/10/2018 3:56 PM) Spinal Surgery - Lower Back   Diagnostic Studies History Sabino Gasser; 02/10/2018 3:56 PM) Colonoscopy  5-10 years ago  Allergies Sabino Gasser; 02/10/2018 3:56 PM) No Known Drug Allergies [02/10/2018]: Allergies Reconciled   Medication History Sabino Gasser; 02/10/2018 3:57 PM) Eliquis (5MG  Tablet, Oral) Active. HydroCHLOROthiazide (12.5MG  Capsule, Oral) Active. Klor-Con M20 [facility] Tablet ER, Oral) Active. Metoprolol  Succinate ER (50MG  Tablet ER 24HR, Oral) Active. HydroCHLOROthiazide (25MG  Tablet, Oral) Active. Medications Reconciled  Social History Sabino Gasser; 02/10/2018 3:56 PM) Alcohol use  Moderate alcohol use. Caffeine use  Coffee, Tea.  Family History Sabino Gasser; 02/10/2018 3:56 PM) Colon Cancer  Mother. Heart Disease  Mother. Heart disease in male family member before age 53  Hypertension  Mother.  Other Problems Sabino Gasser; 02/10/2018 3:56 PM) Atrial Fibrillation  Back Pain  High blood pressure     Review of Systems Sabino Gasser; 02/10/2018 3:56 PM) General Not Present- Appetite Loss, Chills, Fatigue, Fever, Night Sweats, Weight Gain and Weight Loss. Skin Not Present- Change in Wart/Mole, Dryness, Hives, Jaundice, New Lesions, Non-Healing Wounds, Rash and Ulcer. HEENT Present- Ringing in the Ears and Wears glasses/contact lenses. Not Present- Earache, Hearing Loss, Hoarseness, Nose Bleed, Oral Ulcers, Seasonal Allergies, Sinus Pain, Sore Throat, Visual Disturbances and Yellow Eyes. Respiratory Not Present- Bloody sputum, Chronic Cough, Difficulty Breathing, Snoring and Wheezing. Breast Not Present- Breast Mass, Breast Pain, Nipple Discharge and Skin Changes. Cardiovascular Present- Leg Cramps and Swelling of Extremities. Not Present- Chest Pain, Difficulty Breathing Lying Down, Palpitations, Rapid Heart Rate and Shortness of Breath. Gastrointestinal Present- Change in Bowel Habits. Not Present- Abdominal Pain, Bloating, Bloody Stool, Chronic diarrhea, Constipation, Difficulty Swallowing, Excessive gas, Gets full quickly at meals, Hemorrhoids, Indigestion, Nausea, Rectal Pain and Vomiting. Male Genitourinary Not Present- Blood in Urine, Change in Urinary Stream, Frequency, Impotence, Nocturia, Painful Urination, Urgency and Urine Leakage. Musculoskeletal Present- Back Pain and Joint Pain. Not Present- Joint Stiffness, Muscle Pain, Muscle Weakness and Swelling of  Extremities. Neurological Present- Trouble walking and Weakness. Not Present- Decreased Memory, Fainting, Headaches, Numbness, Seizures, Tingling and Tremor. Psychiatric Not Present- Anxiety, Bipolar,  Change in Sleep Pattern, Depression, Fearful and Frequent crying. Endocrine Not Present- Cold Intolerance, Excessive Hunger, Hair Changes, Heat Intolerance, Hot flashes and New Diabetes. Hematology Present- Blood Thinners. Not Present- Easy Bruising, Excessive bleeding, Gland problems, HIV and Persistent Infections.  Vitals Sabino Gasser; 02/10/2018 3:58 PM) 02/10/2018 3:57 PM Weight: 199.38 lb Height: 70in Body Surface Area: 2.08 m Body Mass Index: 28.61 kg/m  Temp.: 98.43F(Oral)  Pulse: 48 (Regular)  BP: 130/82 (Sitting, Left Arm, Standard)       Physical Exam Harrell Gave M. Nalany Steedley MD; 02/10/2018 5:02 PM) The physical exam findings are as follows: Note:Constitutional: No acute distress; conversant; no deformities Eyes: Moist conjunctiva; no lid lag; anicteric sclerae; pupils equal round and reactive to light Neck: Trachea midline; no palpable thyromegaly Lungs: Normal respiratory effort; no tactile fremitus CV: Regular rate and rhythm; no palpable thrill; no pitting edema GI: Abdomen soft, nontender, nondistended; no palpable hepatosplenomegaly MSK: Normal gait; no clubbing/cyanosis Psychiatric: Appropriate affect; alert and oriented 3 Lymphatic: No palpable cervical or axillary lymphadenopathy    Assessment & Plan Harrell Gave M. Regan Mcbryar MD; 02/10/2018 5:05 PM) COLON POLYP (K63.5) Impression: Mr. Willcutt is a very pleasant 77 year old gentleman with history of A. fib, hypertension, hyperlipidemia here today with endoscopically unresectable polyp of the hepatic flexure-tattooed distal to the lesion. This has failed and EMR attempt at removal [facility] -I have recommended laparoscopic versus open right hemicolectomy with final plan being dictated by location of tattoo -We are  requesting cardiac clearance from Dr. Aundra Dubin. Will need to hold blood thinners perioperatively -The anatomy and physiology of the GI tract was discussed at length with the patient. The pathophysiology of colon polyps and cancer was discussed at length with associated pictures. -The planned procedure, material risks (including, but not limited to, pain, bleeding, infection, scarring, need for blood transfusion, damage to surrounding structures- blood vessels/nerves/viscus/organs, damage to ureter, urine leak, leak from anastomosis, need for additional procedures, need for stoma which may be permanent, hernia, recurrence, pneumonia, heart attack, stroke, death) benefits and alternatives to surgery were discussed at length. The patient's questions were answered to his satisfaction, he voiced understanding and elected to proceed with surgery. Additionally, we discussed typical postoperative expectations and the recovery process. We also discussed the potential for adjuvant chemotherapy should locally advanced colon cancer be identified on final pathology -I spent approximately 65 minutes face-to-face with the patient going over all the above, >50%of which was spent in counseling  Signed electronically by Ileana Roup, MD (02/10/2018 5:05 PM)

## 2018-02-14 ENCOUNTER — Encounter: Payer: Self-pay | Admitting: Internal Medicine

## 2018-02-20 ENCOUNTER — Encounter: Payer: Self-pay | Admitting: Internal Medicine

## 2018-02-20 ENCOUNTER — Ambulatory Visit (INDEPENDENT_AMBULATORY_CARE_PROVIDER_SITE_OTHER): Payer: Medicare Other | Admitting: Internal Medicine

## 2018-02-20 DIAGNOSIS — R29898 Other symptoms and signs involving the musculoskeletal system: Secondary | ICD-10-CM | POA: Insufficient documentation

## 2018-02-20 DIAGNOSIS — M545 Low back pain, unspecified: Secondary | ICD-10-CM

## 2018-02-20 DIAGNOSIS — D122 Benign neoplasm of ascending colon: Secondary | ICD-10-CM

## 2018-02-20 DIAGNOSIS — I10 Essential (primary) hypertension: Secondary | ICD-10-CM

## 2018-02-20 DIAGNOSIS — K635 Polyp of colon: Secondary | ICD-10-CM

## 2018-02-20 DIAGNOSIS — I4891 Unspecified atrial fibrillation: Secondary | ICD-10-CM

## 2018-02-20 DIAGNOSIS — G8929 Other chronic pain: Secondary | ICD-10-CM | POA: Diagnosis not present

## 2018-02-20 DIAGNOSIS — R079 Chest pain, unspecified: Secondary | ICD-10-CM

## 2018-02-20 NOTE — Assessment & Plan Note (Addendum)
A little better Arterial Doppler US ok

## 2018-02-20 NOTE — Assessment & Plan Note (Signed)
Eliquis, Toprol

## 2018-02-20 NOTE — Assessment & Plan Note (Signed)
L - chronic

## 2018-02-20 NOTE — Assessment & Plan Note (Signed)
Flat polyp - large The pt needs to have a colectomy surgery per [facility]. The pt wants to see Dr Ivor Messier at [facility] for the 2nd opinion and a polyp removal..[name]

## 2018-02-20 NOTE — Assessment & Plan Note (Addendum)
Doing ok Chronic pain. Options discussed

## 2018-02-20 NOTE — Assessment & Plan Note (Signed)
Metoprolol, Hydrodiuril 

## 2018-02-20 NOTE — Progress Notes (Signed)
Subjective:  Patient ID: [name] [name], male    DOB: 1941-01-20  Age: 77 y.o. MRN: [id number]  CC: No chief complaint on file.   HPI [name] [name] presents for a colon polyp - needs to have a colectomy surgery per [facility]. The pt wants to see Dr Ivor Messier at [facility] for the 2nd opinion... F/u LE pains/weakness  Outpatient Medications Prior to Visit  Medication Sig Dispense Refill  . apixaban (ELIQUIS) 5 MG TABS tablet Take 1 tablet (5 mg total) by mouth 2 (two) times daily. 60 tablet 6  . Cholecalciferol (VITAMIN D3) 2000 units capsule Take 1 capsule (2,000 Units total) by mouth daily. 100 capsule 3  . hydrochlorothiazide (MICROZIDE) 12.5 MG capsule TAKE 1 CAPSULE (12.5 MG TOTAL) BY MOUTH DAILY. 90 capsule 3  . metoprolol succinate (TOPROL-XL) 50 MG 24 hr tablet TAKE 1 TABLET (50 MG TOTAL) BY MOUTH 2 (TWO) TIMES DAILY. TAKE WITH OR IMMEDIATELY FOLLOWING A MEAL. 180 tablet 3  . potassium chloride SA (K-DUR,KLOR-CON) 20 MEQ tablet Take 1 tablet (20 mEq total) by mouth daily. 30 tablet 6   No facility-administered medications prior to visit.     ROS: Review of Systems  Constitutional: Positive for fatigue. Negative for appetite change and unexpected weight change.  HENT: Negative for congestion, nosebleeds, sneezing, sore throat and trouble swallowing.   Eyes: Negative for itching and visual disturbance.  Respiratory: Negative for cough.   Cardiovascular: Negative for chest pain, palpitations and leg swelling.  Gastrointestinal: Negative for abdominal distention, blood in stool, diarrhea and nausea.  Genitourinary: Negative for frequency and hematuria.  Musculoskeletal: Positive for back pain and gait problem. Negative for joint swelling and neck pain.  Skin: Negative for rash.  Neurological: Positive for weakness. Negative for dizziness, tremors and speech difficulty.  Psychiatric/Behavioral: Negative for agitation, dysphoric mood and sleep disturbance. The patient is not  nervous/anxious.     Objective:  BP 134/78 (BP Location: Left Arm, Patient Position: Sitting, Cuff Size: Large)   Pulse (!) 54   Temp 97.7 F (36.5 C) (Oral)   Ht 5\' 10"  (1.778 m)   Wt 203 lb (92.1 kg)   SpO2 98%   BMI 29.13 kg/m   BP Readings from Last 3 Encounters:  02/20/18 134/78  01/11/18 138/82  11/14/17 136/74    Wt Readings from Last 3 Encounters:  02/20/18 203 lb (92.1 kg)  01/11/18 201 lb (91.2 kg)  11/14/17 201 lb (91.2 kg)    Physical Exam  Constitutional: He is oriented to person, place, and time. He appears well-developed. No distress.  NAD  HENT:  Mouth/Throat: Oropharynx is clear and moist.  Eyes: Pupils are equal, round, and reactive to light. Conjunctivae are normal.  Neck: Normal range of motion. No JVD present. No thyromegaly present.  Cardiovascular: Normal rate, regular rhythm, normal heart sounds and intact distal pulses. Exam reveals no gallop and no friction rub.  No murmur heard. Pulmonary/Chest: Effort normal and breath sounds normal. No respiratory distress. He has no wheezes. He has no rales. He exhibits no tenderness.  Abdominal: Soft. Bowel sounds are normal. He exhibits no distension and no mass. There is no tenderness. There is no rebound and no guarding.  Musculoskeletal: Normal range of motion. He exhibits tenderness. He exhibits no edema.  Lymphadenopathy:    He has no cervical adenopathy.  Neurological: He is alert and oriented to person, place, and time. He has normal reflexes. No cranial nerve deficit. He exhibits normal muscle tone. He displays a  negative Romberg sign. Coordination and gait normal.  Skin: Skin is warm and dry. No rash noted.  Psychiatric: He has a normal mood and affect. His behavior is normal. Judgment and thought content normal.    Lab Results  Component Value Date   WBC 4.9 10/27/2017   HGB 16.0 10/27/2017   HCT 48.2 10/27/2017   PLT 200 10/27/2017   GLUCOSE 94 11/07/2017   CHOL 123 08/03/2016   TRIG 142  08/03/2016   HDL 24 (L) 08/03/2016   LDLDIRECT 151.5 11/20/2007   LDLCALC 71 08/03/2016   ALT 19 10/28/2017   AST 26 10/28/2017   NA 139 11/07/2017   K 3.4 (L) 11/07/2017   CL 104 11/07/2017   CREATININE 0.81 11/07/2017   BUN 12 11/07/2017   CO2 26 11/07/2017   TSH 4.26 12/23/2016   PSA 2.76 03/04/2015   INR 1.14 08/02/2016   HGBA1C 5.7 (H) 08/02/2016    US Abdomen Complete  Result Date: 01/27/2018 CLINICAL DATA:  Left upper quadrant abdominal pain over 2 years. EXAM: ABDOMEN ULTRASOUND COMPLETE COMPARISON:  CT 12/21/2004 FINDINGS: Gallbladder: No gallstones or wall thickening visualized. No sonographic Murphy sign noted by sonographer. Common bile duct: Diameter: 2.6 mm, normal Liver: Mildly echogenic suggesting mild fatty change. No focal lesion. Portal vein is patent on color Doppler imaging with normal direction of blood flow towards the liver. IVC: No abnormality visualized. Pancreas: Head and body appear normal. Tail poorly seen because of overlying bowel gas. Spleen: Size and appearance within normal limits. Right Kidney: Length: 12.4 cm. Echogenicity within normal limits. No mass or hydronephrosis visualized. Left Kidney: Length: 12.6 cm. 2.7 x 2.9 x 3.1 cm midportion cyst on the left with some septation. No hydronephrosis. Abdominal aorta: Poorly seen because of overlying gas. Other findings: Ascites IMPRESSION: No cause of left-sided abdominal pain is identified. Poor visualization of the tail the pancreas and of the abdominal aorta because overlying bowel gas. Electronically Signed   By: Nelson Chimes M.D.   On: 01/27/2018 14:58    Assessment & Plan:   There are no diagnoses linked to this encounter.   No orders of the defined types were placed in this encounter.    Follow-up: No follow-ups on file.  [name], MD

## 2018-02-28 ENCOUNTER — Other Ambulatory Visit: Payer: Self-pay

## 2018-02-28 ENCOUNTER — Inpatient Hospital Stay (HOSPITAL_COMMUNITY)
Admission: EM | Admit: 2018-02-28 | Discharge: 2018-03-03 | DRG: 308 | Disposition: A | Discharge status: Home / Self Care | Payer: Medicare Other | Attending: Internal Medicine | Admitting: Internal Medicine

## 2018-02-28 ENCOUNTER — Emergency Department (HOSPITAL_COMMUNITY): Payer: Medicare Other

## 2018-02-28 ENCOUNTER — Encounter (HOSPITAL_COMMUNITY): Payer: Self-pay | Admitting: Emergency Medicine

## 2018-02-28 DIAGNOSIS — J189 Pneumonia, unspecified organism: Secondary | ICD-10-CM

## 2018-02-28 DIAGNOSIS — E876 Hypokalemia: Secondary | ICD-10-CM | POA: Diagnosis not present

## 2018-02-28 DIAGNOSIS — R9389 Abnormal findings on diagnostic imaging of other specified body structures: Secondary | ICD-10-CM | POA: Diagnosis present

## 2018-02-28 DIAGNOSIS — I4892 Unspecified atrial flutter: Secondary | ICD-10-CM | POA: Diagnosis present

## 2018-02-28 DIAGNOSIS — Q231 Congenital insufficiency of aortic valve: Secondary | ICD-10-CM

## 2018-02-28 DIAGNOSIS — Z8249 Family history of ischemic heart disease and other diseases of the circulatory system: Secondary | ICD-10-CM

## 2018-02-28 DIAGNOSIS — I480 Paroxysmal atrial fibrillation: Principal | ICD-10-CM | POA: Diagnosis present

## 2018-02-28 DIAGNOSIS — I110 Hypertensive heart disease with heart failure: Secondary | ICD-10-CM | POA: Diagnosis present

## 2018-02-28 DIAGNOSIS — I4891 Unspecified atrial fibrillation: Secondary | ICD-10-CM | POA: Diagnosis present

## 2018-02-28 DIAGNOSIS — J181 Lobar pneumonia, unspecified organism: Secondary | ICD-10-CM | POA: Diagnosis not present

## 2018-02-28 DIAGNOSIS — R002 Palpitations: Secondary | ICD-10-CM | POA: Diagnosis not present

## 2018-02-28 DIAGNOSIS — R079 Chest pain, unspecified: Secondary | ICD-10-CM | POA: Diagnosis not present

## 2018-02-28 DIAGNOSIS — E785 Hyperlipidemia, unspecified: Secondary | ICD-10-CM | POA: Diagnosis present

## 2018-02-28 DIAGNOSIS — Z7901 Long term (current) use of anticoagulants: Secondary | ICD-10-CM

## 2018-02-28 DIAGNOSIS — I10 Essential (primary) hypertension: Secondary | ICD-10-CM | POA: Diagnosis present

## 2018-02-28 DIAGNOSIS — Z888 Allergy status to other drugs, medicaments and biological substances status: Secondary | ICD-10-CM | POA: Diagnosis not present

## 2018-02-28 DIAGNOSIS — R531 Weakness: Secondary | ICD-10-CM | POA: Diagnosis not present

## 2018-02-28 DIAGNOSIS — I5031 Acute diastolic (congestive) heart failure: Secondary | ICD-10-CM | POA: Diagnosis not present

## 2018-02-28 DIAGNOSIS — I5033 Acute on chronic diastolic (congestive) heart failure: Secondary | ICD-10-CM | POA: Diagnosis present

## 2018-02-28 DIAGNOSIS — I350 Nonrheumatic aortic (valve) stenosis: Secondary | ICD-10-CM

## 2018-02-28 DIAGNOSIS — E7800 Pure hypercholesterolemia, unspecified: Secondary | ICD-10-CM | POA: Diagnosis not present

## 2018-02-28 DIAGNOSIS — I481 Persistent atrial fibrillation: Secondary | ICD-10-CM | POA: Diagnosis present

## 2018-02-28 DIAGNOSIS — R0602 Shortness of breath: Secondary | ICD-10-CM | POA: Diagnosis not present

## 2018-02-28 LAB — BASIC METABOLIC PANEL
Anion gap: 8 (ref 5–15)
BUN: 15 mg/dL (ref 6–20)
CO2: 26 mmol/L (ref 22–32)
Calcium: 9.1 mg/dL (ref 8.9–10.3)
Chloride: 105 mmol/L (ref 101–111)
Creatinine, Ser: 0.95 mg/dL (ref 0.61–1.24)
GFR calc Af Amer: >60 mL/min (ref >60)
GFR calc non Af Amer: >60 mL/min (ref >60)
Glucose, Bld: 149 mg/dL — ABNORMAL HIGH (ref 65–99)
Potassium: 3.6 mmol/L (ref 3.5–5.1)
Sodium: 139 mmol/L (ref 135–145)

## 2018-02-28 LAB — I-STAT CG4 LACTIC ACID, ED
Lactic Acid, Venous: 2.01 mmol/L (ref 0.5–1.9)
Lactic Acid, Venous: 1.19 mmol/L (ref 0.5–1.9)

## 2018-02-28 LAB — CBC
HCT: 48.7 % (ref 39.0–52.0)
Hemoglobin: 16 g/dL (ref 13.0–17.0)
MCH: 30.2 pg (ref 26.0–34.0)
MCHC: 32.9 g/dL (ref 30.0–36.0)
MCV: 91.9 fL (ref 78.0–100.0)
Platelets: 179 10*3/uL (ref 150–400)
RBC: 5.3 MIL/uL (ref 4.22–5.81)
RDW: 12.9 % (ref 11.5–15.5)
WBC: 4.5 10*3/uL (ref 4.0–10.5)

## 2018-02-28 LAB — PROCALCITONIN: Procalcitonin: <0.1 ng/mL

## 2018-02-28 LAB — SEDIMENTATION RATE: Sed Rate: 4 mm/hr (ref 0–16)

## 2018-02-28 LAB — MRSA PCR SCREENING: MRSA by PCR: NEGATIVE

## 2018-02-28 LAB — BRAIN NATRIURETIC PEPTIDE: B Natriuretic Peptide: 644.5 pg/mL — ABNORMAL HIGH (ref 0.0–100.0)

## 2018-02-28 LAB — INFLUENZA PANEL BY PCR (TYPE A & B)
Influenza A By PCR: NEGATIVE
Influenza B By PCR: NEGATIVE

## 2018-02-28 LAB — STREP PNEUMONIAE URINARY ANTIGEN: Strep Pneumo Urinary Antigen: NEGATIVE

## 2018-02-28 LAB — I-STAT TROPONIN, ED: Troponin i, poc: 0.04 ng/mL (ref 0.00–0.08)

## 2018-02-28 LAB — MAGNESIUM: Magnesium: 1.8 mg/dL (ref 1.7–2.4)

## 2018-02-28 MED ORDER — FUROSEMIDE 10 MG/ML IJ SOLN
40.0000 mg | Freq: Once | INTRAMUSCULAR | Status: AC
  Administered 2018-02-28: 40 mg via INTRAVENOUS
  Filled 2018-02-28: qty 4

## 2018-02-28 MED ORDER — SODIUM CHLORIDE 0.9 % IV SOLN
INTRAVENOUS | Status: DC
  Administered 2018-02-28: 13:00:00 via INTRAVENOUS

## 2018-02-28 MED ORDER — OFF THE BEAT BOOK
Freq: Once | Status: AC
  Administered 2018-02-28: 1
  Filled 2018-02-28 (×2): qty 1

## 2018-02-28 MED ORDER — SODIUM CHLORIDE 0.9 % IV SOLN: 500.0000 mg | INTRAVENOUS | Status: DC

## 2018-02-28 MED ORDER — CEFTRIAXONE SODIUM 1 G IJ SOLR
1.0000 g | Freq: Once | INTRAMUSCULAR | Status: AC
  Administered 2018-02-28: 1 g via INTRAVENOUS
  Filled 2018-02-28: qty 10

## 2018-02-28 MED ORDER — AZITHROMYCIN 250 MG PO TABS
500.0000 mg | ORAL_TABLET | Freq: Once | ORAL | Status: AC
  Administered 2018-02-28: 500 mg via ORAL
  Filled 2018-02-28: qty 2

## 2018-02-28 MED ORDER — SODIUM CHLORIDE 0.9 % IV SOLN
Freq: Once | INTRAVENOUS | Status: AC
  Administered 2018-02-28: 12:00:00 via INTRAVENOUS

## 2018-02-28 MED ORDER — DILTIAZEM HCL 100 MG IV SOLR
5.0000 mg/h | Freq: Once | INTRAVENOUS | Status: AC
  Administered 2018-02-28: 5 mg/h via INTRAVENOUS
  Filled 2018-02-28: qty 100

## 2018-02-28 MED ORDER — DILTIAZEM HCL 100 MG IV SOLR
5.0000 mg/h | INTRAVENOUS | Status: DC
  Administered 2018-02-28: 5 mg/h via INTRAVENOUS
  Administered 2018-03-01: 10 mg/h via INTRAVENOUS
  Administered 2018-03-01: 12.5 mg/h via INTRAVENOUS
  Administered 2018-03-02: 5 mg/h via INTRAVENOUS
  Filled 2018-02-28 (×4): qty 100

## 2018-02-28 MED ORDER — VITAMIN D 1000 UNITS PO TABS
2000.0000 [IU] | ORAL_TABLET | Freq: Every day | ORAL | Status: DC
  Administered 2018-02-28 – 2018-03-03 (×4): 2000 [IU] via ORAL
  Filled 2018-02-28 (×7): qty 2

## 2018-02-28 MED ORDER — SODIUM CHLORIDE 0.9 % IV SOLN: 1.0000 g | INTRAVENOUS | Status: DC

## 2018-02-28 MED ORDER — POTASSIUM CHLORIDE CRYS ER 20 MEQ PO TBCR
40.0000 meq | EXTENDED_RELEASE_TABLET | Freq: Once | ORAL | Status: AC
  Administered 2018-02-28: 40 meq via ORAL
  Filled 2018-02-28: qty 2

## 2018-02-28 MED ORDER — APIXABAN 5 MG PO TABS
5.0000 mg | ORAL_TABLET | Freq: Two times a day (BID) | ORAL | Status: DC
  Administered 2018-02-28 – 2018-03-03 (×6): 5 mg via ORAL
  Filled 2018-02-28 (×6): qty 1

## 2018-02-28 MED ORDER — ACETAMINOPHEN 325 MG PO TABS: 650.0000 mg | ORAL_TABLET | ORAL | Status: DC | PRN

## 2018-02-28 MED ORDER — ONDANSETRON HCL 4 MG/2ML IJ SOLN: 4.0000 mg | Freq: Four times a day (QID) | INTRAMUSCULAR | Status: DC | PRN

## 2018-02-28 NOTE — Plan of Care (Signed)
Initiated atrial arrythmia care plan, on cardizem gtt, room air, independent in ambulation.

## 2018-02-28 NOTE — Progress Notes (Addendum)
BNP 644- will dc IVFs and give 1x dose IV Lasix.  PCT <0.10 and Flu PCR neg so will dc anbxs since low index of suspicion symptoms are representative of infectious pneumonia process-pneumonitis not yet excluded but antibiotics would not be indicated at this juncture-plan to DC Zithromax and Rocephin  Erin Hearing, ANP

## 2018-02-28 NOTE — ED Notes (Signed)
Pt's lunch tray arrived. 

## 2018-02-28 NOTE — ED Notes (Signed)
Ordered lunch for patient

## 2018-02-28 NOTE — ED Triage Notes (Addendum)
Pt states he has been having CP/ fluttering for a few days. He was hoping it would go away. Pt has been feeling dizzy, denies nausea. Pt states he has also had a productive cough for a few days- green mucous.
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
## 2018-02-28 NOTE — H&P (Signed)
History and Physical    [name] [name] MWN:[id number] DOB: 1941-03-05 DOA: 02/28/2018  **Will admit patient based on the expectation that the patient will need hospitalization/ hospital care that crosses at least 2 midnights  PCP: [name], [name], MD   Attending physician: Lorin Mercy  Patient coming from/Resides with: Private residence  Chief Complaint: Chest pain associated with palpitations  HPI: [name] [name] is a 77 y.o. male with medical history significant for PAF status post prior DCCV on chronic anticoagulation, hypertension, chronic diastolic heart failure, aortic stenosis, dyslipidemia and arthritis.  Patient reports was in usual state of health until around Saturday or Sunday when he developed palpitations shortly followed by cough with productive yellow sputum.  He reported to triage nurse that he had a productive cough for several days with green mucus.  He has also been having chest pain and palpitations associated with dizziness.  Denies fevers or chills although triage documented apparent isolated temperature of 100.3 prior to arrival.  Upon presentation he was found to be in atrial fibrillation/flutter with ventricular rate of 168 bpm.  He was afebrile.  He had no leukocytosis.  He was relatively hemodynamically stable.  He has been started on a Cardizem infusion with improvement in heart rate although not resolution of underlying tachycardia.  Formal cardiology consultation pending.  Chest x-ray concerning for possible right basilar infiltrate versus atelectasis and given reports of productive cough with colored sputum EDP is opted to treat as possible [facility] pneumonia and has started empiric Rocephin and Zithromax.  ED Course:  Vital Signs: BP (!) 118/105 (BP Location: Right Arm)   Pulse (!) 126   Temp 97.9 F (36.6 C) (Oral)   Resp 18   Ht '5\' 10"'$  (1.778 m)   Wt 90.7 kg (200 lb)   SpO2 98%   BMI 28.70 kg/m  CXR: Right basilar atelectasis versus early  infiltrate Lab data: Sodium 139, potassium 3.6, chloride 105, CO2 26, glucose 149, BUN 15, creatinine 0.95, anion gap of 8, initial troponin normal, initial lactic acid 2.01 improved to 1.19, white count 4500 differential not obtained, hemoglobin 16, platelets 179,000, blood cultures obtained in ER Medications and treatments: Rocephin 1 g IV x1, azithromycin 500 mg IV x1, Cardizem infusion titrated  Review of Systems:  In addition to the HPI above,  No Fever-chills, myalgias or other constitutional symptoms No Headache, changes with Vision or hearing, new weakness, tingling, numbness in any extremity, dizziness, dysarthria or word finding difficulty, gait disturbance or imbalance, tremors or seizure activity No problems swallowing food or Liquids, indigestion/reflux, choking or coughing while eating, abdominal pain with or after eating No Shortness of Breath, orthopnea or DOE No Abdominal pain, N/V, melena,hematochezia, dark tarry stools, constipation No dysuria, malodorous urine, hematuria or flank pain No new skin rashes, lesions, masses or bruises, No new joint pains, aches, swelling or redness No recent unintentional weight gain or loss No polyuria, polydypsia or polyphagia   Past Medical History:  Diagnosis Date  . Adenomatous colon polyp   . Aortic stenosis, mild    Echo 07/11: 55-60%, mild LV hypertrophy, mild aortic stenosis w/mean gradient 55mHg and peak gradient 36 mmHg.  . Ascending aortic aneurysm ([facility]   . Bicuspid aortic valve   . Chest pain    ETT-myoview 12/11 w/exercise, no chest pain, no significant ST changes, EF 69%, no evidence for ischemia or infarction.  . CHF NYHA class I (no symptoms from ordinary activities), acute, diastolic ([facility]   . Hemorrhoids   . HTN (hypertension)   .  Hypercholesteremia   . LBP (low back pain)   . Moderate aortic stenosis   . Osteoarthritis   . Paroxysmal atrial fibrillation ([place])    a. new onset Afib in 07/2008. He underwent  ibutilide cardioversion successfully. b. Recurrence 01/2013 s/p TEE/DCCV - was on Xarelto but he stopped it as he was convinced it was causing joint pn. c. Recurrence 01/2016 - spont conv to NSR. Pt took Eliquis x1 mo then declined further anticoag. d. Recurrence 07/2016.  [name] Pneumonia     Past Surgical History:  Procedure Laterality Date  . BACK SURGERY  x12 years ago  . CARDIOVERSION N/A 01/26/2013   Procedure: CARDIOVERSION;  Surgeon: Larey Dresser, MD;  Location: [facility] ENDOSCOPY;  Service: Cardiovascular;  Laterality: N/A;  . CARDIOVERSION N/A 10/28/2017   Procedure: CARDIOVERSION;  Surgeon: Larey Dresser, MD;  Location: [facility] ENDOSCOPY;  Service: Cardiovascular;  Laterality: N/A;  . COLONOSCOPY    . HEMORRHOID SURGERY    . LUMBAR LAMINECTOMY    . POLYPECTOMY    . TEE WITHOUT CARDIOVERSION N/A 01/26/2013   Procedure: TRANSESOPHAGEAL ECHOCARDIOGRAM (TEE);  Surgeon: Larey Dresser, MD;  Location: [place];  Service: Cardiovascular;  Laterality: N/A;  . TEE WITHOUT CARDIOVERSION N/A 10/28/2017   Procedure: TRANSESOPHAGEAL ECHOCARDIOGRAM (TEE);  Surgeon: Larey Dresser, MD;  Location: [facility] ENDOSCOPY;  Service: Cardiovascular;  Laterality: N/A;    Social History   Socioeconomic History  . Marital status: Married    Spouse name: Not on file  . Number of children: 0  . Years of education: Not on file  . Highest education level: Not on file  Occupational History  . Occupation: Retired Lobbyist: [employer]  Social Needs  . Financial resource strain: Not on file  . Food insecurity:    Worry: Not on file    Inability: Not on file  . Transportation needs:    Medical: Not on file    Non-medical: Not on file  Tobacco Use  . Smoking status: Never Smoker  . Smokeless tobacco: Never Used  Substance and Sexual Activity  . Alcohol use: Yes    Comment: Drinks 1 glass of wine nightly/socially  . Drug use: No  . Sexual activity: Yes  Lifestyle  . Physical activity:     Days per week: Not on file    Minutes per session: Not on file  . Stress: Not on file  Relationships  . Social connections:    Talks on phone: Not on file    Gets together: Not on file    Attends religious service: Not on file    Active member of club or organization: Not on file    Attends meetings of clubs or organizations: Not on file    Relationship status: Not on file  . Intimate partner violence:    Fear of current or ex partner: Not on file    Emotionally abused: Not on file    Physically abused: Not on file    Forced sexual activity: Not on file  Other Topics Concern  . Not on file  Social History Narrative   Patient lives in [place] w/ his wife. He is a native of [place]. 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   Right-handed   Caffeine: 2 cups coffee per day    Mobility: Independent Work history: Previously worked with the [employer]  . Xarelto [Rivaroxaban]  Other (See Comments) and Hypertension    INCREASED BP-HYPERTENSIVE EVENTS  . Corticosteroids Other (See Comments)    Made the patient  "sick," feel "weird," and his "body rejected" them  . Ramipril Other (See Comments)    Could not eat or sleep, lost muscle mass  . Benazepril Cough    Family History  Problem Relation Age of Onset  . Colon cancer Mother 6  . Hypertension Other   . Coronary artery disease Neg Hx   . Colon polyps Neg Hx   . Esophageal cancer Neg Hx   . Rectal cancer Neg Hx   . Stomach cancer Neg Hx      Prior to Admission medications   Medication Sig Start Date End Date Taking? Authorizing Provider  apixaban (ELIQUIS) 5 MG TABS tablet Take 1 tablet (5 mg total) by mouth 2 (two) times daily. 10/28/17  Yes Shirley Friar, PA-C  Cholecalciferol (VITAMIN D3) 2000 units capsule Take 1 capsule (2,000 Units total) by mouth daily. 01/11/18  Yes [name], [name], MD  hydrochlorothiazide  (HYDRODIURIL) 25 MG tablet Take 25 mg by mouth daily. 02/24/18  Yes [provider]  metoprolol succinate (TOPROL-XL) 50 MG 24 hr tablet TAKE 1 TABLET (50 MG TOTAL) BY MOUTH 2 (TWO) TIMES DAILY. TAKE WITH OR IMMEDIATELY FOLLOWING A MEAL. 01/11/18  Yes Larey Dresser, MD  potassium chloride SA (K-DUR,KLOR-CON) 20 MEQ tablet Take 1 tablet (20 mEq total) by mouth daily. Patient taking differently: Take 20 mEq by mouth daily as needed (low potassium).  10/29/17  Yes Shirley Friar, PA-C  hydrochlorothiazide (MICROZIDE) 12.5 MG capsule TAKE 1 CAPSULE (12.5 MG TOTAL) BY MOUTH DAILY. Patient not taking: Reported on 02/28/2018 11/16/17   Cassandria Anger, MD    Physical Exam: Vitals:   02/28/18 1100 02/28/18 1130 02/28/18 1200 02/28/18 1236  BP: 123/74 113/76 116/86 (!) 118/105  Pulse: (!) 108 (!) 116 (!) 41 (!) 126  Resp: '16 14 16 18  '$ Temp:      TempSrc:      SpO2: 96% 96% 96% 98%  Weight:      Height:          Constitutional: NAD, calm, comfortable quite talkative Eyes: PERRL, lids and conjunctivae normal ENMT: Mucous membranes are moist. Posterior pharynx clear of any exudate or lesions.Normal dentition.  Neck: normal, supple, no masses, no thyromegaly Respiratory: Bilateral crackles most prominent right mid field to the base diminishing audibility as you move of the lung fields.  Room air.  Normal respiratory effort. No accessory muscle use.  Cardiovascular: Irregular rhythm c/w atrial fibrillation, persistent mild tachycardia with rates up to 120 bpm, no murmurs / rubs / gallops. No extremity edema. 2+ pedal pulses. No carotid bruits.  Abdomen: no tenderness, no masses palpated. No hepatosplenomegaly. Bowel sounds positive.  Musculoskeletal: no clubbing / cyanosis. No joint deformity upper and lower extremities. Good ROM, no contractures. Normal muscle tone.  Skin: no rashes, lesions, ulcers. No induration Neurologic: CN 2-12 grossly intact. Sensation intact, DTR normal.  Strength 5/5 x all 4 extremities.  Psychiatric: Normal judgment and insight. Alert and oriented x 3. Normal mood.    Labs on Admission: I have personally reviewed following labs and imaging studies  CBC: Recent Labs  Lab 02/28/18 0911  WBC 4.5  HGB 16.0  HCT 48.7  MCV 91.9  PLT 062   Basic Metabolic Panel: Recent Labs  Lab 02/28/18 0911  NA 139  K 3.6  CL 105  CO2 26  GLUCOSE 149*  BUN 15  CREATININE 0.95  CALCIUM 9.1   GFR: Estimated Creatinine Clearance: 72.6 mL/min (by C-G formula based on SCr of 0.95 mg/dL). Liver Function Tests: No results for input(s): AST, ALT, ALKPHOS, BILITOT, PROT, ALBUMIN in the last 168 hours. No results for input(s): LIPASE, AMYLASE in the last 168 hours. No results for input(s): AMMONIA in the last 168 hours. Coagulation Profile: No results for input(s): INR, PROTIME in the last 168 hours. Cardiac Enzymes: No results for input(s): CKTOTAL, CKMB, CKMBINDEX, TROPONINI in the last 168 hours. BNP (last 3 results) No results for input(s): PROBNP in the last 8760 hours. HbA1C: No results for input(s): HGBA1C in the last 72 hours. CBG: No results for input(s): GLUCAP in the last 168 hours. Lipid Profile: No results for input(s): CHOL, HDL, LDLCALC, TRIG, CHOLHDL, LDLDIRECT in the last 72 hours. Thyroid Function Tests: No results for input(s): TSH, T4TOTAL, FREET4, T3FREE, THYROIDAB in the last 72 hours. Anemia Panel: No results for input(s): VITAMINB12, FOLATE, FERRITIN, TIBC, IRON, RETICCTPCT in the last 72 hours. Urine analysis:    Component Value Date/Time   COLORURINE YELLOW 10/21/2016 2355   APPEARANCEUR HAZY (A) 10/21/2016 2355   LABSPEC 1.017 10/21/2016 2355   PHURINE 5.0 10/21/2016 2355   GLUCOSEU NEGATIVE 10/21/2016 2355   GLUCOSEU NEGATIVE 11/19/2015 1352   HGBUR SMALL (A) 10/21/2016 2355   BILIRUBINUR NEGATIVE 10/21/2016 2355   KETONESUR 5 (A) 10/21/2016 2355   PROTEINUR NEGATIVE 10/21/2016 2355   UROBILINOGEN 0.2  11/19/2015 1352   NITRITE NEGATIVE 10/21/2016 2355   LEUKOCYTESUR NEGATIVE 10/21/2016 2355   Sepsis Labs: '@LABRCNTIP'$ (procalcitonin:4,lacticidven:4) )No results found for this or any previous visit (from the past 240 hour(s)).   Radiological Exams on Admission: Dg Chest Port 1 View  Result Date: 02/28/2018 CLINICAL DATA:  Chest pain for 2 days, history of atrial fibrillation EXAM: PORTABLE CHEST 1 VIEW COMPARISON:  10/27/2017 FINDINGS: Cardiac shadows within normal limits. Lungs are well aerated bilaterally. Mild right basilar atelectasis/early infiltrate is noted. No other focal abnormality is seen. IMPRESSION: Right basilar changes as described. Electronically Signed   By: Inez Catalina M.D.   On: 02/28/2018 09:36    EKG: (Independently reviewed) atrial fibrillation with ventricular rate of 168 bpm, QTC 458 ms, normal R wave rotation, voltage criteria met for LVH, downsloping ST segments diffusely consistent with demand ischemia from tachycardia noting on bedside telemetry these changes have resolved with decrease in heart rate  Assessment/Plan Principal Problem:   Atrial fibrillation with RVR   -Presents with symptomatic tachycardia noting awareness of palpitations followed by productive cough with yellow sputum in associated dizziness with EKG revealing A. fib RVR with rates as high as the 160s; of note most recent exacerbation of AF/RVR occurred after patient had episode of pneumonia February 2019. -On beta-blocker prior to admission -Initially diagnosed with A. fib in 2009 and underwent successful pharmacological conversion with ibutilide.  Had recurrence of A. fib in 2014 and underwent TEE/DCCV.  Had another recurrence in 2017 with spontaneous conversion to sinus rhythm and had an additional occurrence in February 2019 and underwent TEE/DCCV -According to EP note patient previously tried amiodarone but felt he did not tolerate this medication -Formal cardiology consultation  pending -Continue Cardizem infusion-titrate as indicated based on parameters for rate -Continue Eliquis-denies poor compliance with this medication -CHA2DS2-VASc=4 -Current potassium less than 4.0 so we will give replacement; check magnesium -Followed by EP Dr. Rayann Heman in the outpatient setting-last office visit 11/14/2017; he documented that patient may be  candidate for Tikosyn should A. fib progress noting QTC <440  Active Problems:   Abnormal chest x-ray -Patient presents with productive cough of either yellow and/or green sputum and possible fevers -Currently not hypoxemic, afebrile and has no leukocytosis therefore if has pneumonia likely viral in etiology -CXR is abnormal but also could be representative of either viral pneumonia/pneumonitis or heart failure in context of RVR -For now treat empirically as CAP with IV Rocephin/Zithromax -Follow-up on blood cultures, obtain sputum culture and urinary strep, obtain respiratory viral panel, obtain ESR and respiratory procalcitonin    HTN (hypertension) -Blood pressure currently controlled in context of RVR -Hold metoprolol in favor of IV Cardizem    CHF NYHA class I (no symptoms from ordinary activities), acute, diastolic  -Appears compensated although abnormal chest x-ray and abnormal pulmonary exam could be more reflective of noninfectious causes such as CHF exacerbation in the context of persistent RVR therefore check BNP -BNP elevated/consistent with heart failure will need to discontinue current IV fluids and consider administration of IV Lasix -Echocardiogram February 2018: Mild concentric hypertrophy with a bicuspid aortic valve with moderate aortic stenosis -Daily weights, strict I's/O -ACE I/ARB secondary to apparent allergy (reported by patient-?  Impaired hearing) -Beta-blocker on hold as above -Holding thiazide diuretic for now    Hypercholesteremia -Not on statin or other medications prior to admission    Aortic  stenosis -Echo 2018 revealed moderate stenosis    **Additional lab, imaging and/or diagnostic evaluation at discretion of supervising physician  DVT prophylaxis: Eliquis Code Status: Full Family Communication: No family at bedside Disposition Plan: Home Consults called: Cardiology/[facility]    Daxton Nydam L. ANP-BC [facility] Pager [phone]   If 7PM-7AM, please contact night-coverage [email or web address] Password [facility]  02/28/2018, 12:47 PM

## 2018-03-01 DIAGNOSIS — R9389 Abnormal findings on diagnostic imaging of other specified body structures: Secondary | ICD-10-CM

## 2018-03-01 DIAGNOSIS — I350 Nonrheumatic aortic (valve) stenosis: Secondary | ICD-10-CM

## 2018-03-01 DIAGNOSIS — I5031 Acute diastolic (congestive) heart failure: Secondary | ICD-10-CM

## 2018-03-01 DIAGNOSIS — E7800 Pure hypercholesterolemia, unspecified: Secondary | ICD-10-CM

## 2018-03-01 DIAGNOSIS — J181 Lobar pneumonia, unspecified organism: Secondary | ICD-10-CM

## 2018-03-01 LAB — BASIC METABOLIC PANEL
Anion gap: 9 (ref 5–15)
BUN: 18 mg/dL (ref 6–20)
CO2: 25 mmol/L (ref 22–32)
Calcium: 8.7 mg/dL — ABNORMAL LOW (ref 8.9–10.3)
Chloride: 105 mmol/L (ref 101–111)
Creatinine, Ser: 0.9 mg/dL (ref 0.61–1.24)
GFR calc Af Amer: >60 mL/min (ref >60)
GFR calc non Af Amer: >60 mL/min (ref >60)
Glucose, Bld: 113 mg/dL — ABNORMAL HIGH (ref 65–99)
Potassium: 3.3 mmol/L — ABNORMAL LOW (ref 3.5–5.1)
Sodium: 139 mmol/L (ref 135–145)

## 2018-03-01 LAB — CBC
HCT: 44 % (ref 39.0–52.0)
Hemoglobin: 14.6 g/dL (ref 13.0–17.0)
MCH: 30.1 pg (ref 26.0–34.0)
MCHC: 33.2 g/dL (ref 30.0–36.0)
MCV: 90.7 fL (ref 78.0–100.0)
Platelets: 157 10*3/uL (ref 150–400)
RBC: 4.85 MIL/uL (ref 4.22–5.81)
RDW: 12.6 % (ref 11.5–15.5)
WBC: 4.3 10*3/uL (ref 4.0–10.5)

## 2018-03-01 LAB — MAGNESIUM: Magnesium: 2.2 mg/dL (ref 1.7–2.4)

## 2018-03-01 LAB — POTASSIUM: Potassium: 3.9 mmol/L (ref 3.5–5.1)

## 2018-03-01 LAB — HIV ANTIBODY (ROUTINE TESTING W REFLEX): HIV Screen 4th Generation wRfx: NONREACTIVE

## 2018-03-01 MED ORDER — METOPROLOL TARTRATE 25 MG PO TABS
25.0000 mg | ORAL_TABLET | Freq: Two times a day (BID) | ORAL | Status: DC
  Administered 2018-03-01 (×2): 25 mg via ORAL
  Filled 2018-03-01 (×2): qty 1

## 2018-03-01 MED ORDER — POTASSIUM CHLORIDE 10 MEQ/100ML IV SOLN
10.0000 meq | INTRAVENOUS | Status: AC
Start: 2018-03-01 — End: 2018-03-01
  Administered 2018-03-01 (×5): 10 meq via INTRAVENOUS
  Filled 2018-03-01 (×5): qty 100

## 2018-03-01 NOTE — Progress Notes (Signed)
PROGRESS NOTE    [name] [name]  VHQ:[id number] DOB: [birth year] DOA: 02/28/2018 PCP: Cassandria Anger, MD   Brief Narrative:   77 y.o. WM PMHx Paroxysmal atrial fibrillation  S/P DCCV on chronic anticoagulation, HTN, Chronic Diastolic CHF, Aortic Stenosis, Dyslipidemia and arthritis.    Developed palpitations shortly followed by cough with productive yellow sputum.  He reported to triage nurse that he had a productive cough for several days with green mucus.  He has also been having chest pain and palpitations associated with dizziness.  Denies fevers or chills although triage documented apparent isolated temperature of 100.3 prior to arrival.  Upon presentation he was found to be in atrial fibrillation/flutter with ventricular rate of 168 bpm.  He was afebrile.  He had no leukocytosis.  He was relatively hemodynamically stable.  He has been started on a Cardizem infusion with improvement in heart rate although not resolution of underlying tachycardia.  Formal cardiology consultation pending.  Chest x-ray concerning for possible right basilar infiltrate versus atelectasis and given reports of productive cough with colored sputum EDP is opted to treat as possible [facility] pneumonia and has started empiric Rocephin and Zithromax.      Subjective: 6/19 A/O x4, negative CP, negative S OB, negative abdominal pain.  Extremely concerned about his heart rate.  States normally remains~50 bpm no more than 60 bpm.   Assessment & Plan:   Principal Problem:   Atrial fibrillation with RVR (HCC) Active Problems:   HTN (hypertension)   Abnormal chest x-ray   Hypercholesteremia   Aortic stenosis, mild   CHF NYHA class I (no symptoms from ordinary activities), acute, diastolic (HCC)  A. fib with RVR(CHAD2-VASC=4) -S/P pharmacological DCCV 2009 with ibutilide.  Recurrence of A. fib RVR 2014 S/P TEE/DCCV.  Another recurrence 2017 with spontaneous conversion sinus rhythm.  February 2019 underwent  TEE/DCCV secondary to recurrence of A. Fib - Currently on Cardizem drip.  Will attempt to titrate off after adding beta-blocker. -According to EP note did not tolerate Amiodarone - 6/19 Metoprolol 25 mg BID (Metoprolol XL 50 mg BID home dose) -Followed by EP Dr. already in the outpatient setting last office visit   11/14/2017: Patient may be candidate for Tikosyn should A. fib progress noting QTC<440   Essential HTN -See A. fib RVR  Aortic stenosis  - Found on echocardiogram February 2018 -See A. fib  Acute diastolic CHF ClassI -Echocardiogram February 2018 mild concentric hypertrophy bicuspid aortic valve with moderate aortic valve stenosis -Strict in and out -Daily weight - Transfuse for hemoglobin<8 - Allergy to ACEI/ARB (reported by patient)  HLD -Not on statin prior to admission - Lipid panel pending  Abnormal CXR -Patient with productive cough yellow/green sputum -Currently not hypoxic, afebrile, negative leukocytosis - Initially treated empirically with CAP protocol but discontinued.  Monitor closely off antibiotics - Follow cultures.    Hypokalemia -Potassium goal> 4 -Potassium IV 50 mEq - Recheck K/Mg@ 1500    DVT prophylaxis: Eliquis Code Status: Full Family Communication: None Disposition Plan: TBD   Consultants:  None  Procedures/Significant Events:  None   I have personally reviewed and interpreted all radiology studies and my findings are as above.  VENTILATOR SETTINGS:    Cultures 6/18 blood pending 6/18 MRSA by PCR negative 6/18 influenza A/B negative 6/18 HIV negative 6/18 strep pneumo urine antigen negative      Antimicrobials: Anti-infectives (From admission, onward)   Start     Stop   03/01/18 1200  cefTRIAXone (ROCEPHIN) 1 g in sodium  chloride 0.9 % 100 mL IVPB  Status:  Discontinued     02/28/18 1358   03/01/18 1200  azithromycin (ZITHROMAX) 500 mg in sodium chloride 0.9 % 250 mL IVPB  Status:  Discontinued     02/28/18  1358   02/28/18 1115  cefTRIAXone (ROCEPHIN) 1 g in sodium chloride 0.9 % 100 mL IVPB     02/28/18 1218   02/28/18 1115  azithromycin (ZITHROMAX) tablet 500 mg     02/28/18 1143       Devices    LINES / TUBES:      Continuous Infusions: . diltiazem (CARDIZEM) infusion 10 mg/hr (03/01/18 0056)     Objective: Vitals:   02/28/18 2057 03/01/18 0005 03/01/18 0429 03/01/18 0726  BP: 116/82 105/72 99/63 98/65   Pulse: 96 100 71   Resp: 16 18 20 15   Temp: 98.4 F (36.9 C) 98 F (36.7 C) 98.8 F (37.1 C) 97.7 F (36.5 C)  TempSrc: Oral Oral Axillary Oral  SpO2: 98% 96% 97%   Weight:      Height:        Intake/Output Summary (Last 24 hours) at 03/01/2018 0759 Last data filed at 03/01/2018 0300 Gross per 24 hour  Intake 697.54 ml  Output 1270 ml  Net -572.46 ml   Filed Weights   02/28/18 0857 02/28/18 1542  Weight: 200 lb (90.7 kg) 192 lb 3.2 oz (87.2 kg)    Examination:  General: A/O x4, No acute respiratory distress Neck:  Negative scars, masses, torticollis, lymphadenopathy, JVD Lungs: mild rhonchi, positive mild diffuse expiratory wheeze, negative crackles  Cardiovascular: Irregularly irregular rhythm and rate, without murmur gallop or rub normal S1 and S2 Abdomen: negative abdominal pain, nondistended, positive soft, bowel sounds, no rebound, no ascites, no appreciable mass Extremities: No significant cyanosis, clubbing, or edema bilateral lower extremities Skin: Negative rashes, lesions, ulcers Psychiatric:  Negative depression, negative anxiety, negative fatigue, negative mania  Central nervous system:  Cranial nerves II through XII intact, tongue/uvula midline, all extremities muscle strength 5/5, sensation intact throughout,  negative dysarthria, negative expressive aphasia, negative receptive aphasia.  .     Data Reviewed: Care during the described time interval was provided by me .  I have reviewed this patient's available data, including medical  history, events of note, physical examination, and all test results as part of my evaluation.   CBC: Recent Labs  Lab 02/28/18 0911 03/01/18 0209  WBC 4.5 4.3  HGB 16.0 14.6  HCT 48.7 44.0  MCV 91.9 90.7  PLT 179 259   Basic Metabolic Panel: Recent Labs  Lab 02/28/18 0911 02/28/18 1255 03/01/18 0209  NA 139  --  139  K 3.6  --  3.3*  CL 105  --  105  CO2 26  --  25  GLUCOSE 149*  --  113*  BUN 15  --  18  CREATININE 0.95  --  0.90  CALCIUM 9.1  --  8.7*  MG  --  1.8  --    GFR: Estimated Creatinine Clearance: 72 mL/min (by C-G formula based on SCr of 0.9 mg/dL). Liver Function Tests: No results for input(s): AST, ALT, ALKPHOS, BILITOT, PROT, ALBUMIN in the last 168 hours. No results for input(s): LIPASE, AMYLASE in the last 168 hours. No results for input(s): AMMONIA in the last 168 hours. Coagulation Profile: No results for input(s): INR, PROTIME in the last 168 hours. Cardiac Enzymes: No results for input(s): CKTOTAL, CKMB, CKMBINDEX, TROPONINI in the last 168 hours. BNP (  last 3 results) No results for input(s): PROBNP in the last 8760 hours. HbA1C: No results for input(s): HGBA1C in the last 72 hours. CBG: No results for input(s): GLUCAP in the last 168 hours. Lipid Profile: No results for input(s): CHOL, HDL, LDLCALC, TRIG, CHOLHDL, LDLDIRECT in the last 72 hours. Thyroid Function Tests: No results for input(s): TSH, T4TOTAL, FREET4, T3FREE, THYROIDAB in the last 72 hours. Anemia Panel: No results for input(s): VITAMINB12, FOLATE, FERRITIN, TIBC, IRON, RETICCTPCT in the last 72 hours. Urine analysis:    Component Value Date/Time   COLORURINE YELLOW 10/21/2016 2355   APPEARANCEUR HAZY (A) 10/21/2016 2355   LABSPEC 1.017 10/21/2016 2355   PHURINE 5.0 10/21/2016 2355   GLUCOSEU NEGATIVE 10/21/2016 2355   GLUCOSEU NEGATIVE 11/19/2015 1352   HGBUR SMALL (A) 10/21/2016 2355   BILIRUBINUR NEGATIVE 10/21/2016 2355   KETONESUR 5 (A) 10/21/2016 2355    PROTEINUR NEGATIVE 10/21/2016 2355   UROBILINOGEN 0.2 11/19/2015 1352   NITRITE NEGATIVE 10/21/2016 2355   LEUKOCYTESUR NEGATIVE 10/21/2016 2355   Sepsis Labs: @LABRCNTIP (procalcitonin:4,lacticidven:4)  ) Recent Results (from the past 240 hour(s))  MRSA PCR Screening     Status: None   Collection Time: 02/28/18  3:53 PM  Result Value Ref Range Status   MRSA by PCR NEGATIVE NEGATIVE Final    Comment:        The GeneXpert MRSA Assay (FDA approved for NASAL specimens only), is one component of a comprehensive MRSA colonization surveillance program. It is not intended to diagnose MRSA infection nor to guide or monitor treatment for MRSA infections. Performed at [facility], [place] [address], [place], [place] [address]          Radiology Studies: Dg Chest Port 1 View  Result Date: 02/28/2018 CLINICAL DATA:  Chest pain for 2 days, history of atrial fibrillation EXAM: PORTABLE CHEST 1 VIEW COMPARISON:  10/27/2017 FINDINGS: Cardiac shadows within normal limits. Lungs are well aerated bilaterally. Mild right basilar atelectasis/early infiltrate is noted. No other focal abnormality is seen. IMPRESSION: Right basilar changes as described. Electronically Signed   By: Inez Catalina M.D.   On: 02/28/2018 09:36        Scheduled Meds: . apixaban  5 mg Oral BID  . cholecalciferol  2,000 Units Oral Daily   Continuous Infusions: . diltiazem (CARDIZEM) infusion 10 mg/hr (03/01/18 0056)     LOS: 1 day    Time spent: 40 minutes    Deyja Sochacki, Geraldo Docker, MD [facility] Pager [phone]   If 7PM-7AM, please contact night-coverage [email or web address] Password [email or web address] 03/01/2018, 7:59 AM

## 2018-03-01 NOTE — Discharge Instructions (Addendum)
Atrial Fibrillation Atrial fibrillation is a type of heartbeat that is irregular or fast (rapid). If you have this condition, your heart keeps quivering in a weird (chaotic) way. This condition can make it so your heart cannot pump blood normally. Having this condition gives a person more risk for stroke, heart failure, and other heart problems. There are different types of atrial fibrillation. Talk with your doctor to learn about the type that you have. Follow these instructions at home:  Take over-the-counter and prescription medicines only as told by your doctor.  If your doctor prescribed a blood-thinning medicine, take it exactly as told. Taking too much of it can cause bleeding. If you do not take enough of it, you will not have the protection that you need against stroke and other problems.  Do not use any tobacco products. These include cigarettes, chewing tobacco, and e-cigarettes. If you need help quitting, ask your doctor.  If you have apnea (obstructive sleep apnea), manage it as told by your doctor.  Do not drink alcohol.  Do not drink beverages that have caffeine. These include coffee, soda, and tea.  Maintain a healthy weight. Do not use diet pills unless your doctor says they are safe for you. Diet pills may make heart problems worse.  Follow diet instructions as told by your doctor.  Exercise regularly as told by your doctor.  Keep all follow-up visits as told by your doctor. This is important. Contact a doctor if:  You notice a change in the speed, rhythm, or strength of your heartbeat.  You are taking a blood-thinning medicine and you notice more bruising.  You get tired more easily when you move or exercise. Get help right away if:  You have pain in your chest or your belly (abdomen).  You have sweating or weakness.  You feel sick to your stomach (nauseous).  You notice blood in your throw up (vomit), poop (stool), or pee (urine).  You are short of  breath.  You suddenly have swollen feet and ankles.  You feel dizzy.  Your suddenly get weak or numb in your face, arms, or legs, especially if it happens on one side of your body.  You have trouble talking, trouble understanding, or both.  Your face or your eyelid droops on one side. These symptoms may be an emergency. Do not wait to see if the symptoms will go away. Get medical help right away. Call your local emergency services (911 in the [place] Do not drive yourself to the hospital. This information is not intended to replace advice given to you by your health care provider. Make sure you discuss any questions you have with your health care provider. Document Released: 06/08/2008 Document Revised: 02/05/2016 Document Reviewed: 12/25/2014 Elsevier Interactive Patient Education  2018 [employer].   Aspirin and Your Heart Aspirin is a medicine that affects the way blood clots. Aspirin can be used to help reduce the risk of blood clots, heart attacks, and other heart-related problems. Should I take aspirin? Your health care provider will help you determine whether it is safe and beneficial for you to take aspirin daily. Taking aspirin daily may be beneficial if you:  Have had a heart attack or chest pain.  Have undergone open heart surgery such as coronary artery bypass surgery (CABG).  Have had coronary angioplasty.  Have experienced a stroke or transient ischemic attack (TIA).  Have peripheral vascular disease (PVD).  Have chronic heart rhythm problems such as atrial fibrillation.  Are  there any risks of taking aspirin daily? Daily use of aspirin can increase your risk of side effects. Some of these include:  Bleeding. Bleeding problems can be minor or serious. An example of a minor problem is a cut that does not stop bleeding. An example of a more serious problem is stomach bleeding or bleeding into the brain. Your risk of bleeding is increased if you are also taking  non-steroidal anti-inflammatory medicine (NSAIDs).  Increased bruising.  Upset stomach.  An allergic reaction. People who have nasal polyps have an increased risk of developing an aspirin allergy.  What are some guidelines I should follow when taking aspirin?  Take aspirin only as directed by your health care provider. Make sure you understand how much you should take and what form you should take. The two forms of aspirin are: ? Non-enteric-coated. This type of aspirin does not have a coating and is absorbed quickly. Non-enteric-coated aspirin is usually recommended for people with chest pain. This type of aspirin also comes in a chewable form. ? Enteric-coated. This type of aspirin has a special coating that releases the medicine very slowly. Enteric-coated aspirin causes less stomach upset than non-enteric-coated aspirin. This type of aspirin should not be chewed or crushed.  Drink alcohol in moderation. Drinking alcohol increases your risk of bleeding. When should I seek medical care?  You have unusual bleeding or bruising.  You have stomach pain.  You have an allergic reaction. Symptoms of an allergic reaction include: ? Hives. ? Itchy skin. ? Swelling of the lips, tongue, or face.  You have ringing in your ears. When should I seek immediate medical care?  Your bowel movements are bloody, dark red, or black in color.  You vomit or cough up blood.  You have blood in your urine.  You cough, wheeze, or feel short of breath. If you have any of the following symptoms, this is an emergency. Do not wait to see if the pain will go away. Get medical help at once. Call your local emergency services (911 in the [place] Do not drive yourself to the hospital.  You have severe chest pain, especially if the pain is crushing or pressure-like and spreads to the arms, back, neck, or jaw.  You have stroke-like symptoms, such as: ? Loss of vision. ? Difficulty talking. ? Numbness or  weakness on one side of your body. ? Numbness or weakness in your arm or leg. ? Not thinking clearly or feeling confused.  This information is not intended to replace advice given to you by your health care provider. Make sure you discuss any questions you have with your health care provider. Document Released: 08/12/2008 Document Revised: 01/07/2016 Document Reviewed: 12/05/2013 Elsevier Interactive Patient Education  2018 [employer].   Hospital doctor cardioversion is the delivery of a jolt of electricity to restore a normal rhythm to the heart. A rhythm that is too fast or is not regular keeps the heart from pumping well. In this procedure, sticky patches or metal paddles are placed on the chest to deliver electricity to the heart from a device. This procedure may be done in an emergency if:  There is low or no blood pressure as a result of the heart rhythm.  Normal rhythm must be restored as fast as possible to protect the brain and heart from further damage.  It may save a life.  This procedure may also be done for irregular or fast heart rhythms that are not immediately life-threatening. Tell  a health care provider about:  Any allergies you have.  All medicines you are taking, including vitamins, herbs, eye drops, creams, and over-the-counter medicines.  Any problems you or family members have had with anesthetic medicines.  Any blood disorders you have.  Any surgeries you have had.  Any medical conditions you have.  Whether you are pregnant or may be pregnant. What are the risks? Generally, this is a safe procedure. However, problems may occur, including:  Allergic reactions to medicines.  A blood clot that breaks free and travels to other parts of your body.  The possible return of an abnormal heart rhythm within hours or days after the procedure.  Your heart stopping (cardiac arrest). This is rare.  What happens before the  procedure? Medicines  Your health care provider may have you start taking: ? Blood-thinning medicines (anticoagulants) so your blood does not clot as easily. ? Medicines may be given to help stabilize your heart rate and rhythm.  Ask your health care provider about changing or stopping your regular medicines. This is especially important if you are taking diabetes medicines or blood thinners. General instructions  Plan to have someone take you home from the hospital or clinic.  If you will be going home right after the procedure, plan to have someone with you for 24 hours.  Follow instructions from your health care provider about eating or drinking restrictions. What happens during the procedure?  To lower your risk of infection: ? Your health care team will wash or sanitize their hands. ? Your skin will be washed with soap.  An IV tube will be inserted into one of your veins.  You will be given a medicine to help you relax (sedative).  Sticky patches (electrodes) or metal paddles may be placed on your chest.  An electrical shock will be delivered. The procedure may vary among health care providers and hospitals. What happens after the procedure?  Your blood pressure, heart rate, breathing rate, and blood oxygen level will be monitored until the medicines you were given have worn off.  Do not drive for 24 hours if you were given a sedative.  Your heart rhythm will be watched to make sure it does not change. This information is not intended to replace advice given to you by your health care provider. Make sure you discuss any questions you have with your health care provider. Document Released: 08/20/2002 Document Revised: 04/28/2016 Document Reviewed: 03/05/2016 Elsevier Interactive Patient Education  2017 [place] have an appointment set up with the [facility].  Multiple studies have shown that being followed by a dedicated atrial fibrillation clinic  in addition to the standard care you receive from your other physicians improves health. We believe that enrollment in the atrial fibrillation clinic will allow [place] to better care for you.   The phone number to the [facility] is [phone]. The clinic is staffed Monday through Friday from 8:30am to 5pm.  Parking Directions: The clinic is located in the Heart and Vascular Building connected to [facility]. 1)From [address] turn on to [address] and go to the 3rd entrance  (Heart and Vascular entrance) on the right. 2)Look to the right for Heart &Vascular Parking Garage. 3)A code for the entrance is required please call the clinic to receive this.   4)Take the elevators to the 1st floor. Registration is in the room with the glass walls at the end of the hallway.  If you have any  trouble parking or locating the clinic, please dont hesitate to call [phone].   Information on my medicine - ELIQUIS (apixaban)  Why was Eliquis prescribed for you? Eliquis was prescribed for you to reduce the risk of a blood clot forming that can cause a stroke if you have a medical condition called atrial fibrillation (a type of irregular heartbeat).  What do You need to know about Eliquis ? Take your Eliquis TWICE DAILY - one tablet in the morning and one tablet in the evening with or without food. If you have difficulty swallowing the tablet whole please discuss with your pharmacist how to take the medication safely.  Take Eliquis exactly as prescribed by your doctor and DO NOT stop taking Eliquis without talking to the doctor who prescribed the medication.  Stopping may increase your risk of developing a stroke.  Refill your prescription before you run out.  After discharge, you should have regular check-up appointments with your healthcare provider that is prescribing your Eliquis.  In the future your dose may need to be changed if your kidney function or weight  changes by a significant amount or as you get older.  What do you do if you miss a dose? If you miss a dose, take it as soon as you remember on the same day and resume taking twice daily.  Do not take more than one dose of ELIQUIS at the same time to make up a missed dose.  Important Safety Information A possible side effect of Eliquis is bleeding. You should call your healthcare provider right away if you experience any of the following: ? Bleeding from an injury or your nose that does not stop. ? Unusual colored urine (red or dark brown) or unusual colored stools (red or black). ? Unusual bruising for unknown reasons. ? A serious fall or if you hit your head (even if there is no bleeding).  Some medicines may interact with Eliquis and might increase your risk of bleeding or clotting while on Eliquis. To help avoid this, consult your healthcare provider or pharmacist prior to using any new prescription or non-prescription medications, including herbals, vitamins, non-steroidal anti-inflammatory drugs (NSAIDs) and supplements.  This website has more information on Eliquis (apixaban): http://[email or web address].com/eliquis/home   Atrial Fibrillation Atrial fibrillation is a type of irregular or rapid heartbeat (arrhythmia). In atrial fibrillation, the heart quivers continuously in a chaotic pattern. This occurs when parts of the heart receive disorganized signals that make the heart unable to pump blood normally. This can increase the risk for stroke, heart failure, and other heart-related conditions. There are different types of atrial fibrillation, including:  Paroxysmal atrial fibrillation. This type starts suddenly, and it usually stops on its own shortly after it starts.  Persistent atrial fibrillation. This type often lasts longer than a week. It may stop on its own or with treatment.  Long-lasting persistent atrial fibrillation. This type lasts longer than 12 months.  Permanent atrial  fibrillation. This type does not go away.  Talk with your health care provider to learn about the type of atrial fibrillation that you have. What are the causes? This condition is caused by some heart-related conditions or procedures, including:  A heart attack.  Coronary artery disease.  Heart failure.  Heart valve conditions.  High blood pressure.  Inflammation of the sac that surrounds the heart (pericarditis).  Heart surgery.  Certain heart rhythm disorders, such as Wolf-Parkinson-White syndrome.  Other causes include:  Pneumonia.  Obstructive sleep apnea.  Blockage of an  artery in the lungs (pulmonary embolism, or PE).  Lung cancer.  Chronic lung disease.  Thyroid problems, especially if the thyroid is overactive (hyperthyroidism).  Caffeine.  Excessive alcohol use or illegal drug use.  Use of some medicines, including certain decongestants and diet pills.  Sometimes, the cause cannot be found. What increases the risk? This condition is more likely to develop in:  People who are older in age.  People who smoke.  People who have diabetes mellitus.  People who are overweight (obese).  Athletes who exercise vigorously.  What are the signs or symptoms? Symptoms of this condition include:  A feeling that your heart is beating rapidly or irregularly.  A feeling of discomfort or pain in your chest.  Shortness of breath.  Sudden light-headedness or weakness.  Getting tired easily during exercise.  In some cases, there are no symptoms. How is this diagnosed? Your health care provider may be able to detect atrial fibrillation when taking your pulse. If detected, this condition may be diagnosed with:  An electrocardiogram (ECG).  A Holter monitor test that records your heartbeat patterns over a 24-hour period.  Transthoracic echocardiogram (TTE) to evaluate how blood flows through your heart.  Transesophageal echocardiogram (TEE) to view more  detailed images of your heart.  A stress test.  Imaging tests, such as a CT scan or chest X-ray.  Blood tests.  How is this treated? The main goals of treatment are to prevent blood clots from forming and to keep your heart beating at a normal rate and rhythm. The type of treatment that you receive depends on many factors, such as your underlying medical conditions and how you feel when you are experiencing atrial fibrillation. This condition may be treated with:  Medicine to slow down the heart rate, bring the hearts rhythm back to normal, or prevent clots from forming.  Electrical cardioversion. This is a procedure that resets your hearts rhythm by delivering a controlled, low-energy shock to the heart through your skin.  Different types of ablation, such as catheter ablation, catheter ablation with pacemaker, or surgical ablation. These procedures destroy the heart tissues that send abnormal signals. When the pacemaker is used, it is placed under your skin to help your heart beat in a regular rhythm.  Follow these instructions at home:  Take over-the counter and prescription medicines only as told by your health care provider.  If your health care provider prescribed a blood-thinning medicine (anticoagulant), take it exactly as told. Taking too much blood-thinning medicine can cause bleeding. If you do not take enough blood-thinning medicine, you will not have the protection that you need against stroke and other problems.  Do not use tobacco products, including cigarettes, chewing tobacco, and e-cigarettes. If you need help quitting, ask your health care provider.  If you have obstructive sleep apnea, manage your condition as told by your health care provider.  Do not drink alcohol.  Do not drink beverages that contain caffeine, such as coffee, soda, and tea.  Maintain a healthy weight. Do not use diet pills unless your health care provider approves. Diet pills may make heart  problems worse.  Follow diet instructions as told by your health care provider.  Exercise regularly as told by your health care provider.  Keep all follow-up visits as told by your health care provider. This is important. How is this prevented?  Avoid drinking beverages that contain caffeine or alcohol.  Avoid certain medicines, especially medicines that are used for breathing problems.  Avoid certain herbs and herbal medicines, such as those that contain ephedra or ginseng.  Do not use illegal drugs, such as cocaine and amphetamines.  Do not smoke.  Manage your high blood pressure. Contact a health care provider if:  You notice a change in the rate, rhythm, or strength of your heartbeat.  You are taking an anticoagulant and you notice increased bruising.  You tire more easily when you exercise or exert yourself. Get help right away if:  You have chest pain, abdominal pain, sweating, or weakness.  You feel nauseous.  You notice blood in your vomit, bowel movement, or urine.  You have shortness of breath.  You suddenly have swollen feet and ankles.  You feel dizzy.  You have sudden weakness or numbness of the face, arm, or leg, especially on one side of the body.  You have trouble speaking, trouble understanding, or both (aphasia).  Your face or your eyelid droops on one side. These symptoms may represent a serious problem that is an emergency. Do not wait to see if the symptoms will go away. Get medical help right away. Call your local emergency services (911 in the [place] Do not drive yourself to the hospital. This information is not intended to replace advice given to you by your health care provider. Make sure you discuss any questions you have with your health care provider. Document Released: 08/30/2005 Document Revised: 01/07/2016 Document Reviewed: 12/25/2014 Elsevier Interactive Patient Education  [employer].

## 2018-03-02 DIAGNOSIS — I481 Persistent atrial fibrillation: Secondary | ICD-10-CM

## 2018-03-02 LAB — BASIC METABOLIC PANEL
Anion gap: 7 (ref 5–15)
Anion gap: 6 (ref 5–15)
BUN: 19 mg/dL (ref 6–20)
BUN: 15 mg/dL (ref 6–20)
CO2: 27 mmol/L (ref 22–32)
CO2: 27 mmol/L (ref 22–32)
Calcium: 8.9 mg/dL (ref 8.9–10.3)
Calcium: 8.9 mg/dL (ref 8.9–10.3)
Chloride: 109 mmol/L (ref 101–111)
Chloride: 110 mmol/L (ref 101–111)
Creatinine, Ser: 0.87 mg/dL (ref 0.61–1.24)
Creatinine, Ser: 0.91 mg/dL (ref 0.61–1.24)
GFR calc Af Amer: >60 mL/min (ref >60)
GFR calc Af Amer: >60 mL/min (ref >60)
GFR calc non Af Amer: >60 mL/min (ref >60)
GFR calc non Af Amer: >60 mL/min (ref >60)
Glucose, Bld: 108 mg/dL — ABNORMAL HIGH (ref 65–99)
Glucose, Bld: 120 mg/dL — ABNORMAL HIGH (ref 65–99)
Potassium: 3.6 mmol/L (ref 3.5–5.1)
Potassium: 4.1 mmol/L (ref 3.5–5.1)
Sodium: 143 mmol/L (ref 135–145)
Sodium: 143 mmol/L (ref 135–145)

## 2018-03-02 LAB — LIPID PANEL
Cholesterol: 141 mg/dL (ref 0–200)
HDL: 22 mg/dL — ABNORMAL LOW (ref >40)
LDL Cholesterol: 92 mg/dL (ref 0–99)
Total CHOL/HDL Ratio: 6.4 RATIO
Triglycerides: 137 mg/dL (ref <150)
VLDL: 27 mg/dL (ref 0–40)

## 2018-03-02 LAB — CBC
HCT: 43.5 % (ref 39.0–52.0)
Hemoglobin: 14.2 g/dL (ref 13.0–17.0)
MCH: 29.9 pg (ref 26.0–34.0)
MCHC: 32.6 g/dL (ref 30.0–36.0)
MCV: 91.6 fL (ref 78.0–100.0)
Platelets: 162 10*3/uL (ref 150–400)
RBC: 4.75 MIL/uL (ref 4.22–5.81)
RDW: 12.8 % (ref 11.5–15.5)
WBC: 4.2 10*3/uL (ref 4.0–10.5)

## 2018-03-02 LAB — MAGNESIUM
Magnesium: 2 mg/dL (ref 1.7–2.4)
Magnesium: 2 mg/dL (ref 1.7–2.4)

## 2018-03-02 MED ORDER — POTASSIUM CHLORIDE CRYS ER 20 MEQ PO TBCR
60.0000 meq | EXTENDED_RELEASE_TABLET | Freq: Once | ORAL | Status: AC
  Administered 2018-03-02: 60 meq via ORAL
  Filled 2018-03-02: qty 3

## 2018-03-02 MED ORDER — ATORVASTATIN CALCIUM 40 MG PO TABS: 40.0000 mg | ORAL_TABLET | Freq: Every day | ORAL | Status: DC

## 2018-03-02 MED ORDER — LEVALBUTEROL HCL 1.25 MG/0.5ML IN NEBU
1.2500 mg | INHALATION_SOLUTION | Freq: Four times a day (QID) | RESPIRATORY_TRACT | Status: DC | PRN
Start: 2018-03-02 — End: 2018-03-03

## 2018-03-02 MED ORDER — LEVALBUTEROL HCL 1.25 MG/0.5ML IN NEBU
1.2500 mg | INHALATION_SOLUTION | Freq: Four times a day (QID) | RESPIRATORY_TRACT | Status: DC
  Administered 2018-03-02 (×2): 1.25 mg via RESPIRATORY_TRACT
  Filled 2018-03-02 (×2): qty 0.5

## 2018-03-02 MED ORDER — METOPROLOL TARTRATE 25 MG PO TABS
37.5000 mg | ORAL_TABLET | Freq: Two times a day (BID) | ORAL | Status: DC
  Administered 2018-03-02: 37.5 mg via ORAL
  Filled 2018-03-02: qty 1

## 2018-03-02 MED ORDER — SODIUM CHLORIDE 0.9 % IV SOLN: 250.0000 mL | INTRAVENOUS | Status: DC | PRN

## 2018-03-02 MED ORDER — SODIUM CHLORIDE 0.9% FLUSH
3.0000 mL | Freq: Two times a day (BID) | INTRAVENOUS | Status: DC
  Administered 2018-03-02 (×2): 3 mL via INTRAVENOUS

## 2018-03-02 MED ORDER — SODIUM CHLORIDE 0.9% FLUSH: 3.0000 mL | INTRAVENOUS | Status: DC | PRN

## 2018-03-02 MED ORDER — DOFETILIDE 500 MCG PO CAPS
500.0000 ug | ORAL_CAPSULE | Freq: Two times a day (BID) | ORAL | Status: DC
  Filled 2018-03-02 (×2): qty 1

## 2018-03-02 MED ORDER — BISACODYL 5 MG PO TBEC: 5.0000 mg | DELAYED_RELEASE_TABLET | Freq: Two times a day (BID) | ORAL | Status: DC | PRN

## 2018-03-02 MED ORDER — METOPROLOL TARTRATE 50 MG PO TABS
50.0000 mg | ORAL_TABLET | Freq: Two times a day (BID) | ORAL | Status: DC
  Administered 2018-03-02 – 2018-03-03 (×2): 50 mg via ORAL
  Filled 2018-03-02 (×2): qty 1

## 2018-03-02 MED ORDER — DM-GUAIFENESIN ER 30-600 MG PO TB12
1.0000 | ORAL_TABLET | Freq: Two times a day (BID) | ORAL | Status: DC
  Administered 2018-03-02 – 2018-03-03 (×3): 1 via ORAL
  Filled 2018-03-02 (×3): qty 1

## 2018-03-02 NOTE — Consult Note (Addendum)
ELECTROPHYSIOLOGY CONSULT NOTE    Patient ID: [name] [name] MRN: [id number], DOB/AGE: 1941-03-31 77 y.o.  Admit date: 02/28/2018 Date of Consult: 03/02/2018  Primary Physician: Cassandria Anger, MD Primary Cardiologist: Aundra Dubin Electrophysiologist: Allred  Patient Profile: [name] [name] is a 77 y.o. male with a history of hypertension, bicuspid aortic valve, moderate AS, chronic diastolic heart failure and persistent atrial fibrillation who is being seen today for the evaluation of atrial fibrillation at the request of Dr Sherral Hammers.  HPI:  [name] [name] is a 77 y.o. male with the above past medical history. He was first diagnosed with atrial fibrillation in 2009 and underwent ibutelide cardioversion at that time. He then had recurrent AF 2014 and underwent TEE/DCCV.  In 2017 he had 2 recurrences of AF, and was placed on amiodarone for a short time which he did not feel like he tolerated. His last cardioversion was in 10/2017 after maintaining SR for nearly a year prior to that. He was seen by Dr Rayann Heman and options for AF treatment were discussed including Tikosyn. At that time, he declined Tikosyn as he had done well for almost a year prior to last cardioversion.  On the day of admission, he presented to the hospital for worsening shortness of breath and productive cough. Fever was 100.3.  He was also in recurrent AF with RVR and placed on Diltiazem drip.  His fever has resolved. He was given a dose of Zithromax on 02/28/18 but none since.  EP has been asked to evaluate for treatment options.  He reports compliance with Eliquis with no missed doses ("I promised Dr Aundra Dubin I would take the medicine").    Echo 10/2016 demonstrated EF 55-60%, no RWMA, bicuspid aortic valve, moderate AS.   He denies chest pain, PND, orthopnea, nausea, vomiting, dizziness, syncope, edema, weight gain, or early satiety.  Past Medical History:  Diagnosis Date  . Adenomatous colon polyp   . Ascending aortic aneurysm  ([place])   . Bicuspid aortic valve   . Chest pain    ETT-myoview 12/11 w/exercise, no chest pain, no significant ST changes, EF 69%, no evidence for ischemia or infarction.  . CHF NYHA class I (no symptoms from ordinary activities), acute, diastolic ([place])   . Hemorrhoids   . HTN (hypertension)   . Hypercholesteremia   . LBP (low back pain)   . Moderate aortic stenosis   . Osteoarthritis   . Paroxysmal atrial fibrillation ([place])    a. new onset Afib in 07/2008. He underwent ibutilide cardioversion successfully. b. Recurrence 01/2013 s/p TEE/DCCV - was on Xarelto but he stopped it as he was convinced it was causing joint pn. c. Recurrence 01/2016 - spont conv to NSR. Pt took Eliquis x1 mo then declined further anticoag. d. Recurrence 07/2016.  [name] Pneumonia      Surgical History:  Past Surgical History:  Procedure Laterality Date  . BACK SURGERY  x12 years ago  . CARDIOVERSION N/A 01/26/2013   Procedure: CARDIOVERSION;  Surgeon: Larey Dresser, MD;  Location: [facility] ENDOSCOPY;  Service: Cardiovascular;  Laterality: N/A;  . CARDIOVERSION N/A 10/28/2017   Procedure: CARDIOVERSION;  Surgeon: Larey Dresser, MD;  Location: [facility] ENDOSCOPY;  Service: Cardiovascular;  Laterality: N/A;  . COLONOSCOPY    . HEMORRHOID SURGERY    . LUMBAR LAMINECTOMY    . POLYPECTOMY    . TEE WITHOUT CARDIOVERSION N/A 01/26/2013   Procedure: TRANSESOPHAGEAL ECHOCARDIOGRAM (TEE);  Surgeon: Larey Dresser, MD;  Location: [facility];  Service: Cardiovascular;  Laterality: N/A;  .  TEE WITHOUT CARDIOVERSION N/A 10/28/2017   Procedure: TRANSESOPHAGEAL ECHOCARDIOGRAM (TEE);  Surgeon: Larey Dresser, MD;  Location: [facility] ENDOSCOPY;  Service: Cardiovascular;  Laterality: N/A;     Medications Prior to Admission  Medication Sig Dispense Refill Last Dose  . apixaban (ELIQUIS) 5 MG TABS tablet Take 1 tablet (5 mg total) by mouth 2 (two) times daily. 60 tablet 6 02/28/2018 at 8am  . Cholecalciferol (VITAMIN D3) 2000 units capsule Take 1  capsule (2,000 Units total) by mouth daily. 100 capsule 3 02/27/2018 at Unknown time  . hydrochlorothiazide (HYDRODIURIL) 25 MG tablet Take 25 mg by mouth daily.  6 02/28/2018 at Unknown time  . metoprolol succinate (TOPROL-XL) 50 MG 24 hr tablet TAKE 1 TABLET (50 MG TOTAL) BY MOUTH 2 (TWO) TIMES DAILY. TAKE WITH OR IMMEDIATELY FOLLOWING A MEAL. 180 tablet 3 02/28/2018 at 8am  . potassium chloride SA (K-DUR,KLOR-CON) 20 MEQ tablet Take 1 tablet (20 mEq total) by mouth daily. (Patient taking differently: Take 20 mEq by mouth daily as needed (low potassium). ) 30 tablet 6 unk at prn  . hydrochlorothiazide (MICROZIDE) 12.5 MG capsule TAKE 1 CAPSULE (12.5 MG TOTAL) BY MOUTH DAILY. (Patient not taking: Reported on 02/28/2018) 90 capsule 3 Not Taking at Unknown time    Inpatient Medications:  . apixaban  5 mg Oral BID  . atorvastatin  40 mg Oral q1800  . cholecalciferol  2,000 Units Oral Daily  . dextromethorphan-guaiFENesin  1 tablet Oral BID  . levalbuterol  1.25 mg Nebulization Q6H  . metoprolol tartrate  50 mg Oral BID    Allergies:  Allergies  Allergen Reactions  . Xarelto [Rivaroxaban] Other (See Comments) and Hypertension    INCREASED BP-HYPERTENSIVE EVENTS  . Corticosteroids Other (See Comments)    Made the patient  "sick," feel "weird," and his "body rejected" them  . Ramipril Other (See Comments)    Could not eat or sleep, lost muscle mass  . Benazepril Cough    Social History   Socioeconomic History  . Marital status: Married    Spouse name: Not on file  . Number of children: 0  . Years of education: Not on file  . Highest education level: Not on file  Occupational History  . Occupation: Retired Lobbyist: [employer]  Social Needs  . Financial resource strain: Not on file  . Food insecurity:    Worry: Not on file    Inability: Not on file  . Transportation needs:    Medical: Not on file    Non-medical: Not on file  Tobacco Use  . Smoking status:  Never Smoker  . Smokeless tobacco: Never Used  Substance and Sexual Activity  . Alcohol use: Yes    Comment: Drinks 1 glass of wine nightly/socially  . Drug use: No  . Sexual activity: Yes  Lifestyle  . Physical activity:    Days per week: Not on file    Minutes per session: Not on file  . Stress: Not on file  Relationships  . Social connections:    Talks on phone: Not on file    Gets together: Not on file    Attends religious service: Not on file    Active member of club or organization: Not on file    Attends meetings of clubs or organizations: Not on file    Relationship status: Not on file  . Intimate partner violence:    Fear of current or ex partner: Not on file  Emotionally abused: Not on file    Physically abused: Not on file    Forced sexual activity: Not on file  Other Topics Concern  . Not on file  Social History Narrative   Patient lives in [place] w/ his wife. He is a native of [place]. 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   Right-handed   Caffeine: 2 cups coffee per day     Family History  Problem Relation Age of Onset  . Colon cancer Mother 6  . Hypertension Other   . Coronary artery disease Neg Hx   . Colon polyps Neg Hx   . Esophageal cancer Neg Hx   . Rectal cancer Neg Hx   . Stomach cancer Neg Hx      Review of Systems: All other systems reviewed and are otherwise negative except as noted above.  Physical Exam: Vitals:   03/02/18 0543 03/02/18 0700 03/02/18 0903 03/02/18 1144  BP:  (!) 125/95    Pulse:  97 (!) 145   Resp:  19    Temp:  (!) 97.4 F (36.3 C)    TempSrc:  Oral    SpO2:  98%  98%  Weight: 190 lb 14.7 oz (86.6 kg)     Height:        GEN- The patient is well appearing, alert and oriented x 3 today.   HEENT: normocephalic, atraumatic; sclera clear, conjunctiva pink; hearing intact; oropharynx clear; neck supple Lungs- Clear to ausculation bilaterally, normal work of breathing.  No  wheezes, rales, rhonchi Heart- Irregular rate and rhythm  GI- soft, non-tender, non-distended, bowel sounds present Extremities- no clubbing, cyanosis, or edema  MS- no significant deformity or atrophy Skin- warm and dry, no rash or lesion Psych- euthymic mood, full affect Neuro- strength and sensation are intact  Labs:   Lab Results  Component Value Date   WBC 4.2 03/02/2018   HGB 14.2 03/02/2018   HCT 43.5 03/02/2018   MCV 91.6 03/02/2018   PLT 162 03/02/2018    Recent Labs  Lab 03/02/18 0223  NA 143  K 3.6  CL 109  CO2 27  BUN 19  CREATININE 0.87  CALCIUM 8.9  GLUCOSE 108*      Radiology/Studies: Dg Chest Port 1 View  Result Date: 02/28/2018 CLINICAL DATA:  Chest pain for 2 days, history of atrial fibrillation EXAM: PORTABLE CHEST 1 VIEW COMPARISON:  10/27/2017 FINDINGS: Cardiac shadows within normal limits. Lungs are well aerated bilaterally. Mild right basilar atelectasis/early infiltrate is noted. No other focal abnormality is seen. IMPRESSION: Right basilar changes as described. Electronically Signed   By: Inez Catalina M.D.   On: 02/28/2018 09:36    TMA:UQJFHL fibrillation, rate 168 (personally reviewed)  TELEMETRY: rate controlled AF (personally reviewed)  Assessment/Plan: 1.  Persistent symptomatic atrial fibrillation He was previously intolerant of amiodarone. Tikosyn has been offered in the past but he declined. He is now willing to take. Will start Tikosyn 554mcg twice daily tonight. QTc ok, Mg ok, will replete K+ and recheck later today. He did get dose of Zithromax on day of admission but none since - pharmacy will verify ok to start Tikosyn (discussed with them today).  Continue Eliquis long term for CHADS2VASC of at least 3 - he reports compliance with no missed doses in last 4 weeks.  If still in AF on Saturday, will need DCCV  2.  HTN Stable No change required today  3.  Biscuspid aortic valve  Followed by Dr Aundra Dubin   Dr Curt Bears to see later  today  Signed, Chanetta Marshall, NP 03/02/2018 11:47 AM   I have seen and examined this patient with Chanetta Marshall.  Agree with above, note added to reflect my findings.  On exam, iRRR, no murmurs, lungs clear.  Presented to the hospital with rapid atrial fibrillation.  He was previously seen by Dr. Rayann Heman and discussed loading with dofetilide.  He had refused in the past but has agreed today.  He has recurrent atrial fibrillation with rapid rates requiring diltiazem drip.  We will plan to start dofetilide tonight.  Will M. Camnitz MD 03/02/2018 6:25 PM

## 2018-03-02 NOTE — Progress Notes (Signed)
PROGRESS NOTE    [name] [name]  WJX:[id number] DOB: August 18, 1941 DOA: 02/28/2018 PCP: Cassandria Anger, MD   Brief Narrative:   77 y.o. WM PMHx Paroxysmal atrial fibrillation  S/P DCCV on chronic anticoagulation, HTN, Chronic Diastolic CHF, Aortic Stenosis, Dyslipidemia and arthritis.    Developed palpitations shortly followed by cough with productive yellow sputum.  He reported to triage nurse that he had a productive cough for several days with green mucus.  He has also been having chest pain and palpitations associated with dizziness.  Denies fevers or chills although triage documented apparent isolated temperature of 100.3 prior to arrival.  Upon presentation he was found to be in atrial fibrillation/flutter with ventricular rate of 168 bpm.  He was afebrile.  He had no leukocytosis.  He was relatively hemodynamically stable.  He has been started on a Cardizem infusion with improvement in heart rate although not resolution of underlying tachycardia.  Formal cardiology consultation pending.  Chest x-ray concerning for possible right basilar infiltrate versus atelectasis and given reports of productive cough with colored sputum EDP is opted to treat as possible [facility] pneumonia and has started empiric Rocephin and Zithromax.      Subjective: 6/20 8/O x4, negative CP, negative S OB, negative abdominal pain.  Patient's HR jumps to 140s to 150s on exertion.    Assessment & Plan:   Principal Problem:   Atrial fibrillation with RVR ([place]) Active Problems:   HTN (hypertension)   Abnormal chest x-ray   Hypercholesteremia   Aortic stenosis, mild   CHF NYHA class I (no symptoms from ordinary activities), acute, diastolic ([facility])  A. fib with RVR(CHAD2-VASC=4) -S/P pharmacological DCCV 2009 with ibutilide.  Recurrence of A. fib RVR 2014 S/P TEE/DCCV.  Another recurrence 2017 with spontaneous conversion sinus rhythm.  February 2019 underwent TEE/DCCV secondary to recurrence of A. Fib -  Continued uncontrolled A. fib. -Continue Cardizem drip.   -Currently on Cardizem drip.   -According to EP note did not tolerate Amiodarone - 6/20 increase Metoprolol 50 mg BID (Metoprolol XL 50 mg BID home dose) -Followed by EP Dr. Rayann Heman  in the outpatient setting last office visit   11/14/2017: Patient may be candidate for Tikosyn should A. fib progress noting QTC<440 - 6/20 discussed case with cardiology will see patient and make recommendations  Essential HTN -See A. fib RVR  Aortic stenosis  - Found on echocardiogram February 2018 -See A. fib  Acute diastolic CHF ClassI -Echocardiogram February 2018 mild concentric hypertrophy bicuspid aortic valve with moderate aortic valve stenosis -Strict in and out this admission +291ml -Daily weight Filed Weights   02/28/18 1542 03/01/18 1509 03/02/18 0543  Weight: 192 lb 3.2 oz (87.2 kg) 197 lb 8.5 oz (89.6 kg) 190 lb 14.7 oz (86.6 kg)  - Transfuse for hemoglobin<8 - Allergy to ACEI/ARB (reported by patient)  HLD -Not on statin prior to admission - Lipid panel not within ADA guidelines -Start on Lipitor 40 mg daily  Abnormal CXR -Patient with productive cough yellow/green sputum -Currently not hypoxic, afebrile, negative leukocytosis - Initially treated empirically with CAP protocol but discontinued.  Monitor closely off antibiotics - Follow cultures. - Xopenex QID -Flutter valve -Mucinex DM    Hypokalemia -Potassium goal> 4    DVT prophylaxis: Eliquis Code Status: Full Family Communication: None Disposition Plan: TBD   Consultants:  Cardiology pending    Procedures/Significant Events:  None   I have personally reviewed and interpreted all radiology studies and my findings are as above.  VENTILATOR SETTINGS:  Cultures 6/18 blood pending 6/18 MRSA by PCR negative 6/18 influenza A/B negative 6/18 HIV negative 6/18 strep pneumo urine antigen negative      Antimicrobials: Anti-infectives (From  admission, onward)   Start     Stop   03/01/18 1200  cefTRIAXone (ROCEPHIN) 1 g in sodium chloride 0.9 % 100 mL IVPB  Status:  Discontinued     02/28/18 1358   03/01/18 1200  azithromycin (ZITHROMAX) 500 mg in sodium chloride 0.9 % 250 mL IVPB  Status:  Discontinued     02/28/18 1358   02/28/18 1115  cefTRIAXone (ROCEPHIN) 1 g in sodium chloride 0.9 % 100 mL IVPB     02/28/18 1218   02/28/18 1115  azithromycin (ZITHROMAX) tablet 500 mg     02/28/18 1143       Devices    LINES / TUBES:      Continuous Infusions: . diltiazem (CARDIZEM) infusion 5 mg/hr (03/02/18 0200)     Objective: Vitals:   03/01/18 2314 03/02/18 0515 03/02/18 0543 03/02/18 0700  BP: 105/64   (!) 125/95  Pulse: 99   97  Resp:      Temp: 98 F (36.7 C) 97.6 F (36.4 C)  (!) 97.4 F (36.3 C)  TempSrc: Oral Oral  Oral  SpO2:    98%  Weight:   190 lb 14.7 oz (86.6 kg)   Height:        Intake/Output Summary (Last 24 hours) at 03/02/2018 0744 Last data filed at 03/02/2018 0200 Gross per 24 hour  Intake 1113.36 ml  Output 550 ml  Net 563.36 ml   Filed Weights   02/28/18 1542 03/01/18 1509 03/02/18 0543  Weight: 192 lb 3.2 oz (87.2 kg) 197 lb 8.5 oz (89.6 kg) 190 lb 14.7 oz (86.6 kg)    Physical Exam:  General: A/O X4, No acute respiratory distress Neck:  Negative scars, masses, torticollis, lymphadenopathy, JVD Lungs: diffuse expiratory wheeze, negative crackles  Cardiovascular: Irregular irregular rhythm and rate, without murmur gallop or rub normal S1 and S2 Abdomen: negative abdominal pain, nondistended, positive soft, bowel sounds, no rebound, no ascites, no appreciable mass Extremities: No significant cyanosis, clubbing, or edema bilateral lower extremities Skin: Negative rashes, lesions, ulcers Psychiatric:  Negative depression, negative anxiety, negative fatigue, negative mania  Central nervous system:  Cranial nerves II through XII intact, tongue/uvula midline, all extremities muscle  strength 5/5, sensation intact throughout,  negative dysarthria, negative expressive aphasia, negative receptive aphasia.  .     Data Reviewed: Care during the described time interval was provided by me .  I have reviewed this patient's available data, including medical history, events of note, physical examination, and all test results as part of my evaluation.   CBC: Recent Labs  Lab 02/28/18 0911 03/01/18 0209 03/02/18 0223  WBC 4.5 4.3 4.2  HGB 16.0 14.6 14.2  HCT 48.7 44.0 43.5  MCV 91.9 90.7 91.6  PLT 179 157 030   Basic Metabolic Panel: Recent Labs  Lab 02/28/18 0911 02/28/18 1255 03/01/18 0209 03/01/18 1416 03/02/18 0223  NA 139  --  139  --  143  K 3.6  --  3.3* 3.9 3.6  CL 105  --  105  --  109  CO2 26  --  25  --  27  GLUCOSE 149*  --  113*  --  108*  BUN 15  --  18  --  19  CREATININE 0.95  --  0.90  --  0.87  CALCIUM  9.1  --  8.7*  --  8.9  MG  --  1.8  --  2.2 2.0   GFR: Estimated Creatinine Clearance: 74.5 mL/min (by C-G formula based on SCr of 0.87 mg/dL). Liver Function Tests: No results for input(s): AST, ALT, ALKPHOS, BILITOT, PROT, ALBUMIN in the last 168 hours. No results for input(s): LIPASE, AMYLASE in the last 168 hours. No results for input(s): AMMONIA in the last 168 hours. Coagulation Profile: No results for input(s): INR, PROTIME in the last 168 hours. Cardiac Enzymes: No results for input(s): CKTOTAL, CKMB, CKMBINDEX, TROPONINI in the last 168 hours. BNP (last 3 results) No results for input(s): PROBNP in the last 8760 hours. HbA1C: No results for input(s): HGBA1C in the last 72 hours. CBG: No results for input(s): GLUCAP in the last 168 hours. Lipid Profile: Recent Labs    03/02/18 0223  CHOL 141  HDL 22*  LDLCALC 92  TRIG 137  CHOLHDL 6.4   Thyroid Function Tests: No results for input(s): TSH, T4TOTAL, FREET4, T3FREE, THYROIDAB in the last 72 hours. Anemia Panel: No results for input(s): VITAMINB12, FOLATE, FERRITIN,  TIBC, IRON, RETICCTPCT in the last 72 hours. Urine analysis:    Component Value Date/Time   COLORURINE YELLOW 10/21/2016 2355   APPEARANCEUR HAZY (A) 10/21/2016 2355   LABSPEC 1.017 10/21/2016 2355   PHURINE 5.0 10/21/2016 2355   GLUCOSEU NEGATIVE 10/21/2016 2355   GLUCOSEU NEGATIVE 11/19/2015 1352   HGBUR SMALL (A) 10/21/2016 2355   BILIRUBINUR NEGATIVE 10/21/2016 2355   KETONESUR 5 (A) 10/21/2016 2355   PROTEINUR NEGATIVE 10/21/2016 2355   UROBILINOGEN 0.2 11/19/2015 1352   NITRITE NEGATIVE 10/21/2016 2355   LEUKOCYTESUR NEGATIVE 10/21/2016 2355   Sepsis Labs: @LABRCNTIP (procalcitonin:4,lacticidven:4)  ) Recent Results (from the past 240 hour(s))  Blood culture (routine x 2)     Status: None (Preliminary result)   Collection Time: 02/28/18 10:33 AM  Result Value Ref Range Status   Specimen Description BLOOD BLOOD RIGHT FOREARM  Final   Special Requests   Final    BOTTLES DRAWN AEROBIC AND ANAEROBIC Blood Culture adequate volume Performed at [facility], [place] [address] [place], [place] [address]    Culture NO GROWTH 1 DAY  Final   Report Status PENDING  Incomplete  Blood culture (routine x 2)     Status: None (Preliminary result)   Collection Time: 02/28/18 10:33 AM  Result Value Ref Range Status   Specimen Description BLOOD RIGHT ANTECUBITAL  Final   Special Requests   Final    BOTTLES DRAWN AEROBIC AND ANAEROBIC Blood Culture results may not be optimal due to an excessive volume of blood received in culture bottles Performed at [facility] [address] [place], [place] [address]    Culture NO GROWTH 1 DAY  Final   Report Status PENDING  Incomplete  MRSA PCR Screening     Status: None   Collection Time: 02/28/18  3:53 PM  Result Value Ref Range Status   MRSA by PCR NEGATIVE NEGATIVE Final    Comment:        The GeneXpert MRSA Assay (FDA approved for NASAL specimens only), is one component of a comprehensive MRSA colonization surveillance  program. It is not intended to diagnose MRSA infection nor to guide or monitor treatment for MRSA infections. Performed at [facility], [place] [address], [place], [place] [address]          Radiology Studies: Dg Chest [place] [address]  Result Date: 02/28/2018 CLINICAL DATA:  Chest pain for 2 days, history of atrial fibrillation EXAM: PORTABLE CHEST 1 VIEW COMPARISON:  10/27/2017 FINDINGS: Cardiac shadows within normal limits. Lungs are well aerated bilaterally. Mild right basilar atelectasis/early infiltrate is noted. No other focal abnormality is seen. IMPRESSION: Right basilar changes as described. Electronically Signed   By: Inez Catalina M.D.   On: 02/28/2018 09:36        Scheduled Meds: . apixaban  5 mg Oral BID  . cholecalciferol  2,000 Units Oral Daily  . metoprolol tartrate  25 mg Oral BID   Continuous Infusions: . diltiazem (CARDIZEM) infusion 5 mg/hr (03/02/18 0200)     LOS: 2 days    Time spent: 40 minutes    WOODS, Geraldo Docker, MD [facility] Pager [phone]   If 7PM-7AM, please contact night-coverage [email or web address] Password [facility] 03/02/2018, 7:44 AM

## 2018-03-02 NOTE — Progress Notes (Signed)
Pharmacy Review for Dofetilide (Tikosyn) Initiation  Admit Complaint: 77 y.o. male admitted 02/28/2018 with atrial fibrillation to be initiated on dofetilide.   Assessment:  Patient Exclusion Criteria: If any screening criteria checked as "Yes", then  patient  should NOT receive dofetilide until criteria item is corrected. If "Yes" please indicate correction plan.  YES  NO Patient  Exclusion Criteria Correction Plan  [x]  []  Baseline QTc interval is greater than or equal to 440 msec. IF above YES box checked dofetilide contraindicated unless patient has ICD; then may proceed if QTc 500-550 msec or with known ventricular conduction abnormalities may proceed with QTc 550-600 msec. QTc = 0.43 Reviewed by EP and okay to give (last QTc 458; repeat EKG ordered)  []  [x]  Magnesium level is less than 1.8 mEq/l : Last magnesium:  Lab Results  Component Value Date   MG 2.0 03/02/2018         [x]  []  Potassium level is less than 4 mEq/l : Last potassium:  Lab Results  Component Value Date   K 3.6 03/02/2018       60 mEq PO ordered for 6/20 - will recheck this afternoon  []  [x]  Patient is known or suspected to have a digoxin level greater than 2 ng/ml: No results found for: DIGOXIN    []  [x]  Creatinine clearance less than 20 ml/min (calculated using Cockcroft-Gault, actual body weight and serum creatinine): Estimated Creatinine Clearance: 74.5 mL/min (by C-G formula based on SCr of 0.87 mg/dL).    []  [x]  Patient has received drugs known to prolong the QT intervals within the last 48 hours (phenothiazines, tricyclics or tetracyclic antidepressants, erythromycin, H-1 antihistamines, cisapride, fluoroquinolones, azithromycin). Drugs not listed above may have an, as yet, undetected potential to prolong the QT interval, updated information on QT prolonging agents is available at this website:QT prolonging agents Last dose of azithromycin was on 6/18@1143  - outside of 48 hr range  []  [x]  Patient received  a dose of hydrochlorothiazide (Oretic) alone or in any combination including triamterene (Dyazide, Maxzide) in the last 48 hours. Marked as not taking PTA and not reordered since admission  []  [x]  Patient received a medication known to increase dofetilide plasma concentrations prior to initial dofetilide dose:  . Trimethoprim (Primsol, Proloprim) in the last 36 hours . Verapamil (Calan, Verelan) in the last 36 hours or a sustained release dose in the last 72 hours . Megestrol (Megace) in the last 5 days  . Cimetidine (Tagamet) in the last 6 hours . Ketoconazole (Nizoral) in the last 24 hours . Itraconazole (Sporanox) in the last 48 hours  . Prochlorperazine (Compazine) in the last 36 hours    []  [x]  Patient is known to have a history of torsades de pointes; congenital or acquired long QT syndromes.   []  [x]  Patient has received a Class 1 antiarrhythmic with less than 2 half-lives since last dose. (Disopyramide, Quinidine, Procainamide, Lidocaine, Mexiletine, Flecainide, Propafenone)   []  [x]  Patient has received amiodarone therapy in the past 3 months or amiodarone level is greater than 0.3 ng/ml.    Patient has been appropriately anticoagulated with apixaban.  Ordering provider was confirmed at [email or web address] if they are not listed on the [facility] Prescribers list.  Goal of Therapy: Follow renal function, electrolytes, potential drug interactions, and dose adjustment. Provide education and 1 week supply at discharge.  Plan:  [x]   Physician selected initial dose within range recommended for patients level of renal function - will monitor for response.  []   Physician  selected initial dose outside of range recommended for patients level of renal function - will discuss if the dose should be altered at this time.   Select One Calculated CrCl  Dose q12h  [x]  > 60 ml/min 500 mcg  []  40-60 ml/min 250 mcg  []  20-40 ml/min 125 mcg   2. Follow up QTc after the first 5 doses, renal  function, electrolytes (K & Mg) daily x 3     days, dose adjustment, success of initiation and facilitate 1 week discharge supply as     clinically indicated.  3. Initiate Tikosyn education video (Call [phone] and ask for video # [phone]).  4. Place Enrollment Form on the chart for discharge supply of dofetilide.   Doylene Canard, PharmD Clinical Pharmacist  Pager: [phone] Phone: [phone]  12:25 PM 03/02/2018

## 2018-03-03 ENCOUNTER — Inpatient Hospital Stay (HOSPITAL_COMMUNITY): Payer: Medicare Other | Admitting: Certified Registered Nurse Anesthetist

## 2018-03-03 ENCOUNTER — Encounter (HOSPITAL_COMMUNITY): Payer: Self-pay | Admitting: *Deleted

## 2018-03-03 ENCOUNTER — Encounter (HOSPITAL_COMMUNITY)
Admission: EM | Disposition: A | Discharge status: Home / Self Care | Payer: Self-pay | Source: Home / Self Care | Attending: Internal Medicine

## 2018-03-03 DIAGNOSIS — I4891 Unspecified atrial fibrillation: Secondary | ICD-10-CM

## 2018-03-03 DIAGNOSIS — I10 Essential (primary) hypertension: Secondary | ICD-10-CM

## 2018-03-03 HISTORY — PX: CARDIOVERSION: SHX1299

## 2018-03-03 LAB — CBC
HCT: 43.2 % (ref 39.0–52.0)
Hemoglobin: 14 g/dL (ref 13.0–17.0)
MCH: 30.2 pg (ref 26.0–34.0)
MCHC: 32.4 g/dL (ref 30.0–36.0)
MCV: 93.1 fL (ref 78.0–100.0)
Platelets: 174 10*3/uL (ref 150–400)
RBC: 4.64 MIL/uL (ref 4.22–5.81)
RDW: 12.8 % (ref 11.5–15.5)
WBC: 4.8 10*3/uL (ref 4.0–10.5)

## 2018-03-03 LAB — BASIC METABOLIC PANEL
Anion gap: 5 (ref 5–15)
BUN: 16 mg/dL (ref 6–20)
CO2: 28 mmol/L (ref 22–32)
Calcium: 8.6 mg/dL — ABNORMAL LOW (ref 8.9–10.3)
Chloride: 110 mmol/L (ref 101–111)
Creatinine, Ser: 1.07 mg/dL (ref 0.61–1.24)
GFR calc Af Amer: >60 mL/min (ref >60)
GFR calc non Af Amer: >60 mL/min (ref >60)
Glucose, Bld: 104 mg/dL — ABNORMAL HIGH (ref 65–99)
Potassium: 4.3 mmol/L (ref 3.5–5.1)
Sodium: 143 mmol/L (ref 135–145)

## 2018-03-03 LAB — MAGNESIUM: Magnesium: 1.9 mg/dL (ref 1.7–2.4)

## 2018-03-03 SURGERY — CARDIOVERSION
Anesthesia: General

## 2018-03-03 MED ORDER — SODIUM CHLORIDE 0.9 % IV SOLN
INTRAVENOUS | Status: DC
  Administered 2018-03-03: 10:00:00 via INTRAVENOUS

## 2018-03-03 MED ORDER — DILTIAZEM HCL-DEXTROSE 100-5 MG/100ML-% IV SOLN (PREMIX)
5.0000 mg/h | INTRAVENOUS | Status: DC
  Filled 2018-03-03 (×2): qty 100

## 2018-03-03 MED ORDER — SODIUM CHLORIDE 0.9 % IV SOLN
INTRAVENOUS | Status: DC | PRN
  Administered 2018-03-03: 10:00:00 via INTRAVENOUS

## 2018-03-03 MED ORDER — ATORVASTATIN CALCIUM 40 MG PO TABS: 40.0000 mg | ORAL_TABLET | Freq: Every day | ORAL | 0 refills | Status: DC

## 2018-03-03 NOTE — H&P (View-Only) (Signed)
   Progress Note   Subjective   Doing well today, the patient denies CP or SOB.  No new concerns  Inpatient Medications    Scheduled Meds: . apixaban  5 mg Oral BID  . atorvastatin  40 mg Oral q1800  . cholecalciferol  2,000 Units Oral Daily  . dextromethorphan-guaiFENesin  1 tablet Oral BID  . metoprolol tartrate  50 mg Oral BID  . sodium chloride flush  3 mL Intravenous Q12H   Continuous Infusions: . sodium chloride    . diltiazem (CARDIZEM) infusion Stopped (03/02/18 1530)   PRN Meds: sodium chloride, acetaminophen, bisacodyl, levalbuterol, ondansetron (ZOFRAN) IV, sodium chloride flush   Vital Signs    Vitals:   03/02/18 2204 03/02/18 2322 03/03/18 0436 03/03/18 0802  BP: 122/79 128/86 122/78 (!) 129/94  Pulse: 99 87 79 (!) 45  Resp:  18 18 16   Temp:  97.8 F (36.6 C) (!) 97.5 F (36.4 C) 98.4 F (36.9 C)  TempSrc:  Oral  Oral  SpO2:  98% 98% 95%  Weight:   191 lb 4.8 oz (86.8 kg)   Height:        Intake/Output Summary (Last 24 hours) at 03/03/2018 0809 Last data filed at 03/03/2018 0436 Gross per 24 hour  Intake 938.09 ml  Output 700 ml  Net 238.09 ml   Filed Weights   03/01/18 1509 03/02/18 0543 03/03/18 0436  Weight: 197 lb 8.5 oz (89.6 kg) 190 lb 14.7 oz (86.6 kg) 191 lb 4.8 oz (86.8 kg)    Telemetry    Afib, V rates elevated - Personally Reviewed  Physical Exam   GEN- The patient is well appearing, alert and oriented x 3 today.   Head- normocephalic, atraumatic Eyes-  Sclera clear, conjunctiva pink Ears- hearing intact Oropharynx- clear Neck- supple, Lungs- Clear to ausculation bilaterally, normal work of breathing Heart- irregular rate and rhythm  GI- soft, NT, ND, + BS Extremities- no clubbing, cyanosis, or edema    Labs    Chemistry Recent Labs  Lab 03/02/18 0223 03/02/18 1647 03/03/18 0316  NA 143 143 143  K 3.6 4.1 4.3  CL 109 110 110  CO2 27 27 28   GLUCOSE 108* 120* 104*  BUN 19 15 16   CREATININE 0.87 0.91 1.07    CALCIUM 8.9 8.9 8.6*  GFRNONAA >60 >60 >60  GFRAA >60 >60 >60  ANIONGAP 7 6 5      Hematology Recent Labs  Lab 03/01/18 0209 03/02/18 0223 03/03/18 0316  WBC 4.3 4.2 4.8  RBC 4.85 4.75 4.64  HGB 14.6 14.2 14.0  HCT 44.0 43.5 43.2  MCV 90.7 91.6 93.1  MCH 30.1 29.9 30.2  MCHC 33.2 32.6 32.4  RDW 12.6 12.8 12.8  PLT 157 162 174    Cardiac EnzymesNo results for input(s): TROPONINI in the last 168 hours.  Recent Labs  Lab 02/28/18 0958  TROPIPOC 0.04        Assessment & Plan    1.  Persistent afib Declines initiation of AAD therapy at this time. chads2vasc score is 3.  On eliquis, without interruption. Will plan cardioversion today and discharge. Follow-up in the AF clinic next week  2. HTN Stable No change required today  3. Bicuspid aortic valve Followed by Dr Aundra Dubin  OK to discharge later today after cardioversion  Electrophysiology team to see as needed while here. Please call with questions.  Thompson Grayer MD, [facility] 03/03/2018 8:09 AM

## 2018-03-03 NOTE — Progress Notes (Addendum)
Patient refused Tikosyn medication stated he had already discussed with Dr. Aundra Dubin.  Education with patient about medication and handout provided, patient adamant about not taking medication at this time.

## 2018-03-03 NOTE — Anesthesia Postprocedure Evaluation (Signed)
Anesthesia Post Note  Patient: [name] [name]  Procedure(s) Performed: CARDIOVERSION (N/A )     Patient location during evaluation: PACU Anesthesia Type: General Level of consciousness: awake and alert Pain management: pain level controlled Vital Signs Assessment: post-procedure vital signs reviewed and stable Respiratory status: spontaneous breathing, nonlabored ventilation, respiratory function stable and patient connected to nasal cannula oxygen Cardiovascular status: blood pressure returned to baseline and stable Postop Assessment: no apparent nausea or vomiting Anesthetic complications: no    Last Vitals:  Vitals:   03/03/18 1118 03/03/18 1200  BP: 113/75 (!) 108/41  Pulse: 63   Resp: 19 18  Temp: 36.6 C   SpO2: 98%     Last Pain:  Vitals:   03/03/18 1118  TempSrc: Oral  PainSc:                  Akshaya Toepfer

## 2018-03-03 NOTE — Anesthesia Procedure Notes (Signed)
Procedure Name: General with mask airway Date/Time: 03/03/2018 10:08 AM Performed by: Harden Mo, CRNA Pre-anesthesia Checklist: Patient identified, Emergency Drugs available, Suction available and Patient being monitored Patient Re-evaluated:Patient Re-evaluated prior to induction Oxygen Delivery Method: Ambu bag Preoxygenation: Pre-oxygenation with 100% oxygen Induction Type: IV induction Placement Confirmation: positive ETCO2 and breath sounds checked- equal and bilateral Dental Injury: Teeth and Oropharynx as per pre-operative assessment

## 2018-03-03 NOTE — Procedures (Signed)
Electrical Cardioversion Procedure Note Darron Stuck [id number] Mar 21, 1941  Procedure: Electrical Cardioversion Indications:  Atrial Fibrillation  Procedure Details Consent: Risks of procedure as well as the alternatives and risks of each were explained to the (patient/caregiver).  Consent for procedure obtained. Time Out: Verified patient identification, verified procedure, site/side was marked, verified correct patient position, special equipment/implants available, medications/allergies/relevent history reviewed, required imaging and test results available.  Performed  Patient placed on cardiac monitor, pulse oximetry, supplemental oxygen as necessary.  Sedation given: Pt sedated by anesthesia with lidocaine 80 mg and diprovan 80 mg IV. Pacer pads placed anterior and posterior chest.  Cardioverted 1 time(s).  Cardioverted at 120J.  Evaluation Findings: Post procedure EKG shows: NSR Complications: None Patient did tolerate procedure well.   Kirk Ruths 03/03/2018, 7:58 AM

## 2018-03-03 NOTE — Discharge Summary (Signed)
DISCHARGE SUMMARY  [name] [name]  MR#: [id number]  DOB:Feb 10, 1941  Date of Admission: 02/28/2018 Date of Discharge: 03/03/2018  Attending Physician:[name] Catino Hennie Duos, MD  Patient's WYO:VZCHYIFOY, [name] Lacks, MD  Consults:  [name] [name]  Disposition: D/C home   Follow-up Appts: Follow-up Information    [facility] Follow up on 03/09/2018.   Specialty:  [name] Why:  at 1:30PM  Contact information: [address] [id number] [place] [phone]       [name] [name], [name] Lacks, MD Follow up in 1 week(s).   Specialty:  Internal Medicine Contact information: [place] [address] [phone]           Tests Needing Follow-up: -assess need to resume HCTZ/KCl  -assess HR -routine f/u of pt newly started on Lipitor -routine f/u of pt on long term anticoag  Discharge Diagnoses: A. fib with RVR Essential HTN Aortic stenosis - Bicuspid valve  HLD Abnormal CXR Hypokalemia  Initial presentation: 77 y.o. M Hx Paroxysmal atrial fibrillation S/P DCCV on chronic anticoagulation, HTN, Chronic Diastolic CHF, Aortic Stenosis, Dyslipidemia and arthritis who developed palpitations following several days of cough with yellow sputum. Upon presentation he was found to be in atrial fibrillation/flutter with ventricular rate of 168 bpm. He was afebrile. He had no leukocytosis. He was relatively hemodynamically stable. Chest x-ray was concerning for possible right basilar infiltrate versus atelectasis.  Hospital Course:  A. fib with RVR CHAD2-VASC is 3 on Eliquis w/o interuption - S/P pharmacological cardioversion 2009 with ibutilide - recurrence of A. fib RVR 2014 S/P TEE/DCCV - another recurrence 2017 with spontaneous conversion sinus rhythm - February 2019 underwent TEE/DCCV secondary to recurrence of A. Fib - according to EP did not tolerate Amiodarone - Cards followed - pt refused antiarrythmic tx and  DCCV was carried out 6/21 w/ success - cleared for D/C by Cards - to f/u in Afib clinic next week  Essential HTN BP stable at time of d/c   Aortic stenosis - Bicuspid valve  Followed as outpt by Dr. Aundra [name]   HLD Not on statin prior to admission - Lipid panel not within ADA guidelines - Dr. Sherral [name] started Lipitor 40 mg daily  Abnormal CXR Patient with productive cough yellow/green sputum at presentation but not hypoxic, is afebrile, and negative leukocytosis - initially treated empirically with CAP protocol but discontinued - clinically stable at d/c - likely viral URI   Hypokalemia Corrected to goal of 4.0 before d/c   Allergies as of 03/03/2018      Reactions   Xarelto [rivaroxaban] Other (See Comments), Hypertension   INCREASED BP-HYPERTENSIVE EVENTS   Corticosteroids Other (See Comments)   Made the patient  "sick," feel "weird," and his "body rejected" them   Ramipril Other (See Comments)   Could not eat or sleep, lost muscle mass   Benazepril Cough      Medication List    STOP taking these medications   hydrochlorothiazide 12.5 MG capsule Commonly known as:  MICROZIDE   hydrochlorothiazide 25 MG tablet Commonly known as:  HYDRODIURIL   potassium chloride SA 20 MEQ tablet Commonly known as:  K-DUR,KLOR-CON     TAKE these medications   apixaban 5 MG Tabs tablet Commonly known as:  ELIQUIS Take 1 tablet (5 mg total) by mouth 2 (two) times daily.   atorvastatin 40 MG tablet Commonly known as:  LIPITOR Take 1 tablet (40 mg total) by mouth daily at 6 PM.   metoprolol succinate 50 MG 24  hr tablet Commonly known as:  TOPROL-XL TAKE 1 TABLET (50 MG TOTAL) BY MOUTH 2 (TWO) TIMES DAILY. TAKE WITH OR IMMEDIATELY FOLLOWING A MEAL.   Vitamin D3 2000 units capsule Take 1 capsule (2,000 Units total) by mouth daily.       Day of Discharge BP 113/75 (BP Location: Right Arm)   Pulse 63   Temp 97.8 F (36.6 C) (Oral)   Resp 19   Ht 5\' 11"  (1.803 m)   Wt 86.8 kg  (191 lb 4.8 oz)   SpO2 98%   BMI 26.68 kg/m   Physical Exam: General: No acute respiratory distress Lungs: Clear to auscultation bilaterally without wheezes or crackles Cardiovascular: Regular rate and rhythm without murmur gallop or rub normal S1 and S2 Abdomen: Nontender, nondistended, soft, bowel sounds positive, no rebound, no ascites, no appreciable mass Extremities: No significant cyanosis, clubbing, or edema bilateral lower extremities  Basic Metabolic Panel: Recent Labs  Lab 02/28/18 0911 02/28/18 1255 03/01/18 0209 03/01/18 1416 03/02/18 0223 03/02/18 1647 03/03/18 0316  NA 139  --  139  --  143 143 143  K 3.6  --  3.3* 3.9 3.6 4.1 4.3  CL 105  --  105  --  109 110 110  CO2 26  --  25  --  27 27 28   GLUCOSE 149*  --  113*  --  108* 120* 104*  BUN 15  --  18  --  19 15 16   CREATININE 0.95  --  0.90  --  0.87 0.91 1.07  CALCIUM 9.1  --  8.7*  --  8.9 8.9 8.6*  MG  --  1.8  --  2.2 2.0 2.0 1.9   CBC: Recent Labs  Lab 02/28/18 0911 03/01/18 0209 03/02/18 0223 03/03/18 0316  WBC 4.5 4.3 4.2 4.8  HGB 16.0 14.6 14.2 14.0  HCT 48.7 44.0 43.5 43.2  MCV 91.9 90.7 91.6 93.1  PLT 179 157 162 174    Recent Results (from the past 240 hour(s))  Blood culture (routine x 2)     Status: None (Preliminary result)   Collection Time: 02/28/18 10:33 AM  Result Value Ref Range Status   Specimen Description BLOOD BLOOD RIGHT FOREARM  Final   Special Requests   Final    BOTTLES DRAWN AEROBIC AND ANAEROBIC Blood Culture adequate volume   Culture   Final    NO GROWTH 3 DAYS Performed at [facility], [address], [place], [place] [address]    Report Status PENDING  Incomplete  Blood culture (routine x 2)     Status: None (Preliminary result)   Collection Time: 02/28/18 10:33 AM  Result Value Ref Range Status   Specimen Description BLOOD RIGHT ANTECUBITAL  Final   Special Requests   Final    BOTTLES DRAWN AEROBIC AND ANAEROBIC Blood Culture results may not be  optimal due to an excessive volume of blood received in culture bottles   Culture   Final    NO GROWTH 3 DAYS Performed at [facility], [place] [address], [place], [place] [address]    Report Status PENDING  Incomplete  MRSA PCR Screening     Status: None   Collection Time: 02/28/18  3:53 PM  Result Value Ref Range Status   MRSA by PCR NEGATIVE NEGATIVE Final    Comment:        The GeneXpert MRSA Assay (FDA approved for NASAL specimens only), is one component of a comprehensive  MRSA colonization surveillance program. It is not intended to diagnose MRSA infection nor to guide or monitor treatment for MRSA infections. Performed at [facility], [place] [address] [place], [place] [address]      Time spent in discharge (includes decision making & examination of pt): 35 minutes  03/03/2018, 12:19 PM   Cherene Altes, MD [facility] Office  [phone] Pager [phone]  On-Call/Text Page:      [email or web address]      password [facility]

## 2018-03-03 NOTE — Interval H&P Note (Signed)
History and Physical Interval Note:  03/03/2018 10:00 AM  [name] [name]  has presented today for surgery, with the diagnosis of afib  The various methods of treatment have been discussed with the patient and family. After consideration of risks, benefits and other options for treatment, the patient has consented to  Procedure(s): CARDIOVERSION (N/A) as a surgical intervention .  The patient's history has been reviewed, patient examined, no change in status, stable for surgery.  I have reviewed the patient's chart and labs.  Questions were answered to the patient's satisfaction.     Kirk Ruths

## 2018-03-03 NOTE — Anesthesia Preprocedure Evaluation (Addendum)
Anesthesia Evaluation  Patient identified by MRN, date of birth, ID band Patient awake    Reviewed: Allergy & Precautions, NPO status , Patient's Chart, lab work & pertinent test results  Airway Mallampati: II  TM Distance: >3 FB Neck ROM: Full    Dental no notable dental hx. (+) Teeth Intact, Dental Advisory Given   Pulmonary neg pulmonary ROS,    Pulmonary exam normal breath sounds clear to auscultation       Cardiovascular hypertension, negative cardio ROS Normal cardiovascular exam Rhythm:Regular Rate:Normal  Bicuspid Aortic Valve   Neuro/Psych negative neurological ROS  negative psych ROS   GI/Hepatic negative GI ROS, Neg liver ROS,   Endo/Other  negative endocrine ROS  Renal/GU negative Renal ROS  negative genitourinary   Musculoskeletal negative musculoskeletal ROS (+)   Abdominal   Peds negative pediatric ROS (+)  Hematology negative hematology ROS (+)   Anesthesia Other Findings   Reproductive/Obstetrics negative OB ROS                            Anesthesia Physical  Anesthesia Plan  ASA: III  Anesthesia Plan: General   Post-op Pain Management:    Induction: Intravenous  PONV Risk Score and Plan: 1 and Treatment may vary due to age or medical condition  Airway Management Planned: Mask, Natural Airway and Nasal Cannula  Additional Equipment:   Intra-op Plan:   Post-operative Plan:   Informed Consent: I have reviewed the patients History and Physical, chart, labs and discussed the procedure including the risks, benefits and alternatives for the proposed anesthesia with the patient or authorized representative who has indicated his/her understanding and acceptance.     Plan Discussed with: CRNA, Anesthesiologist and Surgeon  Anesthesia Plan Comments:         Anesthesia Quick Evaluation

## 2018-03-03 NOTE — Progress Notes (Signed)
Educated on d/c instructions; IV removed; belongings collected; prescription given; will wheel out once ready.   [place]  Kailan Laws, RN

## 2018-03-03 NOTE — Transfer of Care (Signed)
Immediate Anesthesia Transfer of Care Note  Patient: [name] [name]  Procedure(s) Performed: CARDIOVERSION (N/A )  Patient Location: Endoscopy Unit  Anesthesia Type:General  Level of Consciousness: awake and alert   Airway & Oxygen Therapy: Patient Spontanous Breathing  Post-op Assessment: Report given to RN, Post -op Vital signs reviewed and stable and Patient moving all extremities X 4  Post vital signs: Reviewed and stable  Last Vitals:  Vitals Value Taken Time  BP 108/71 03/03/2018 10:29 AM  Temp    Pulse 67 03/03/2018 10:29 AM  Resp 18 03/03/2018 10:29 AM  SpO2 98 % 03/03/2018 10:29 AM    Last Pain:  Vitals:   03/03/18 0955  TempSrc: Oral  PainSc: 0-No pain      Patients Stated Pain Goal: 3 (92/23/00 9794)  Complications: No apparent anesthesia complications

## 2018-03-03 NOTE — Care Management (Addendum)
Update:  Pt will not discharge home on Tikoysn   TIKOSYN 125 MCG BID  COVER- YES  CO-PAY- 90.43  TIER- NO  PRIOR APPROVAL- YES # [phone] CALL PATIENT PCP   2. TIKOSYN 250 MCG  COVER - YES  CO-PAY- $ 81.40  [place]  # [id number] PATIENT PCP   3. TIKOSYN 500 MCG  COVER- YES  CO-PAY- $ 79.54  TIER- NO  PRIOR APPROVAL - YES # [id number] PATIENT PCP  Tikosyn Benefit Check  4. DOFETILIDE 125 MCG  BID  COVER- YES  CO-PAY- $ 90.73  TIER- NO  PRIOR APPROVAL- YES # [phone] PATIENT PCP    5 DOFETILIDE 250 MCG  COVER- YES  CO-PAY- $81.40  TIER- NO  PRIOR APPROVAL- YES # [id number] PATIENT PCP   6. DOFETILIDE 500 MCG  COVER- YES  CO-PAY- $ 79.54  TIER- NO  PRIOR APPROVAL: YES # [id number] PATIENT PCP   NO DEDUCTIBLE   PREFERRED PHARMACY : YES [facility] AND [facility]   Previous Messages

## 2018-03-03 NOTE — Progress Notes (Signed)
   Progress Note   Subjective   Doing well today, the patient denies CP or SOB.  No new concerns  Inpatient Medications    Scheduled Meds: . apixaban  5 mg Oral BID  . atorvastatin  40 mg Oral q1800  . cholecalciferol  2,000 Units Oral Daily  . dextromethorphan-guaiFENesin  1 tablet Oral BID  . metoprolol tartrate  50 mg Oral BID  . sodium chloride flush  3 mL Intravenous Q12H   Continuous Infusions: . sodium chloride    . diltiazem (CARDIZEM) infusion Stopped (03/02/18 1530)   PRN Meds: sodium chloride, acetaminophen, bisacodyl, levalbuterol, ondansetron (ZOFRAN) IV, sodium chloride flush   Vital Signs    Vitals:   03/02/18 2204 03/02/18 2322 03/03/18 0436 03/03/18 0802  BP: 122/79 128/86 122/78 (!) 129/94  Pulse: 99 87 79 (!) 45  Resp:  18 18 16   Temp:  97.8 F (36.6 C) (!) 97.5 F (36.4 C) 98.4 F (36.9 C)  TempSrc:  Oral  Oral  SpO2:  98% 98% 95%  Weight:   191 lb 4.8 oz (86.8 kg)   Height:        Intake/Output Summary (Last 24 hours) at 03/03/2018 0809 Last data filed at 03/03/2018 0436 Gross per 24 hour  Intake 938.09 ml  Output 700 ml  Net 238.09 ml   Filed Weights   03/01/18 1509 03/02/18 0543 03/03/18 0436  Weight: 197 lb 8.5 oz (89.6 kg) 190 lb 14.7 oz (86.6 kg) 191 lb 4.8 oz (86.8 kg)    Telemetry    Afib, V rates elevated - Personally Reviewed  Physical Exam   GEN- The patient is well appearing, alert and oriented x 3 today.   Head- normocephalic, atraumatic Eyes-  Sclera clear, conjunctiva pink Ears- hearing intact Oropharynx- clear Neck- supple, Lungs- Clear to ausculation bilaterally, normal work of breathing Heart- irregular rate and rhythm  GI- soft, NT, ND, + BS Extremities- no clubbing, cyanosis, or edema    Labs    Chemistry Recent Labs  Lab 03/02/18 0223 03/02/18 1647 03/03/18 0316  NA 143 143 143  K 3.6 4.1 4.3  CL 109 110 110  CO2 27 27 28   GLUCOSE 108* 120* 104*  BUN 19 15 16   CREATININE 0.87 0.91 1.07    CALCIUM 8.9 8.9 8.6*  GFRNONAA >60 >60 >60  GFRAA >60 >60 >60  ANIONGAP 7 6 5      Hematology Recent Labs  Lab 03/01/18 0209 03/02/18 0223 03/03/18 0316  WBC 4.3 4.2 4.8  RBC 4.85 4.75 4.64  HGB 14.6 14.2 14.0  HCT 44.0 43.5 43.2  MCV 90.7 91.6 93.1  MCH 30.1 29.9 30.2  MCHC 33.2 32.6 32.4  RDW 12.6 12.8 12.8  PLT 157 162 174    Cardiac EnzymesNo results for input(s): TROPONINI in the last 168 hours.  Recent Labs  Lab 02/28/18 0958  TROPIPOC 0.04        Assessment & Plan    1.  Persistent afib Declines initiation of AAD therapy at this time. chads2vasc score is 3.  On eliquis, without interruption. Will plan cardioversion today and discharge. Follow-up in the AF clinic next week  2. HTN Stable No change required today  3. Bicuspid aortic valve Followed by Dr Aundra Dubin  OK to discharge later today after cardioversion  Electrophysiology team to see as needed while here. Please call with questions.  Thompson Grayer MD, [facility] 03/03/2018 8:09 AM

## 2018-03-04 ENCOUNTER — Encounter (HOSPITAL_COMMUNITY): Payer: Self-pay | Admitting: Cardiology

## 2018-03-05 LAB — CULTURE, BLOOD (ROUTINE X 2)
Culture: NO GROWTH
Culture: NO GROWTH
Special Requests: ADEQUATE

## 2018-03-09 ENCOUNTER — Encounter (HOSPITAL_COMMUNITY): Payer: Self-pay | Admitting: Nurse Practitioner

## 2018-03-09 ENCOUNTER — Ambulatory Visit (HOSPITAL_COMMUNITY)
Admit: 2018-03-09 | Discharge: 2018-03-09 | Disposition: A | Discharge status: Home / Self Care | Payer: Medicare Other | Source: Ambulatory Visit | Attending: Nurse Practitioner | Admitting: Nurse Practitioner

## 2018-03-09 VITALS — BP 156/88 | HR 61 | Ht 71.0 in | Wt 199.6 lb

## 2018-03-09 DIAGNOSIS — I110 Hypertensive heart disease with heart failure: Secondary | ICD-10-CM | POA: Diagnosis not present

## 2018-03-09 DIAGNOSIS — E7800 Pure hypercholesterolemia, unspecified: Secondary | ICD-10-CM | POA: Insufficient documentation

## 2018-03-09 DIAGNOSIS — I712 Thoracic aortic aneurysm, without rupture: Secondary | ICD-10-CM | POA: Diagnosis not present

## 2018-03-09 DIAGNOSIS — M1990 Unspecified osteoarthritis, unspecified site: Secondary | ICD-10-CM | POA: Diagnosis not present

## 2018-03-09 DIAGNOSIS — Z86010 Personal history of colonic polyps: Secondary | ICD-10-CM | POA: Insufficient documentation

## 2018-03-09 DIAGNOSIS — Q231 Congenital insufficiency of aortic valve: Secondary | ICD-10-CM | POA: Diagnosis not present

## 2018-03-09 DIAGNOSIS — I509 Heart failure, unspecified: Secondary | ICD-10-CM | POA: Insufficient documentation

## 2018-03-09 DIAGNOSIS — Z7901 Long term (current) use of anticoagulants: Secondary | ICD-10-CM | POA: Insufficient documentation

## 2018-03-09 DIAGNOSIS — Z800 Family history of malignant neoplasm of digestive organs: Secondary | ICD-10-CM | POA: Diagnosis not present

## 2018-03-09 DIAGNOSIS — Z888 Allergy status to other drugs, medicaments and biological substances status: Secondary | ICD-10-CM | POA: Diagnosis not present

## 2018-03-09 DIAGNOSIS — Z79899 Other long term (current) drug therapy: Secondary | ICD-10-CM | POA: Diagnosis not present

## 2018-03-09 DIAGNOSIS — I480 Paroxysmal atrial fibrillation: Secondary | ICD-10-CM | POA: Insufficient documentation

## 2018-03-09 NOTE — Progress Notes (Signed)
Primary Care Physician: Cassandria Anger, MD Referring Physician: [facility]  EP: Dr. Carolann Littler Butkus is a 77 y.o. male with a h/o paroxysmal   afib on chronic anticoagualtion, that was recently in [facility] for URI symptoms and found to be in afib. Has tried amiodarone in the past and did not tolerate. He has had many cardiovrsions in the past, the last being 10/2017. While he was in the hospital, the plan was to start tikosyn but pt did not want to start drug so he was cardioverted and d/c home with f/u here.  He is still very clear that he does not want Tikosyn at this point. He feels the URI symptoms were the trigger for afib with RVR. He has a general dislike/distrust of meds in general but is taking his Eliquis as he promised Dr. Aundra Dubin that he would, but feels it contributes to high BP.  He is pending surgery for removal of a polyp.  Today, he denies symptoms of palpitations, chest pain, shortness of breath, orthopnea, PND, lower extremity edema, dizziness, presyncope, syncope, or neurologic sequela. The patient is tolerating medications without difficulties and is otherwise without complaint today.   Past Medical History:  Diagnosis Date  . Adenomatous colon polyp   . Ascending aortic aneurysm ([place])   . Bicuspid aortic valve   . Chest pain    ETT-myoview 12/11 w/exercise, no chest pain, no significant ST changes, EF 69%, no evidence for ischemia or infarction.  . CHF NYHA class I (no symptoms from ordinary activities), acute, diastolic ([place])   . Hemorrhoids   . HTN (hypertension)   . Hypercholesteremia   . LBP (low back pain)   . Moderate aortic stenosis   . Osteoarthritis   . Paroxysmal atrial fibrillation ([place])    a. new onset Afib in 07/2008. He underwent ibutilide cardioversion successfully. b. Recurrence 01/2013 s/p TEE/DCCV - was on Xarelto but he stopped it as he was convinced it was causing joint pn. c. Recurrence 01/2016 - spont conv to NSR. Pt took Eliquis x1 mo then  declined further anticoag. d. Recurrence 07/2016.  [name] Pneumonia    Past Surgical History:  Procedure Laterality Date  . BACK SURGERY  x12 years ago  . CARDIOVERSION N/A 01/26/2013   Procedure: CARDIOVERSION;  Surgeon: Larey Dresser, MD;  Location: [facility] ENDOSCOPY;  Service: Cardiovascular;  Laterality: N/A;  . CARDIOVERSION N/A 10/28/2017   Procedure: CARDIOVERSION;  Surgeon: Larey Dresser, MD;  Location: [facility] ENDOSCOPY;  Service: Cardiovascular;  Laterality: N/A;  . CARDIOVERSION N/A 03/03/2018   Procedure: CARDIOVERSION;  Surgeon: Lelon Perla, MD;  Location: [facility] ENDOSCOPY;  Service: Cardiovascular;  Laterality: N/A;  . COLONOSCOPY    . HEMORRHOID SURGERY    . LUMBAR LAMINECTOMY    . POLYPECTOMY    . TEE WITHOUT CARDIOVERSION N/A 01/26/2013   Procedure: TRANSESOPHAGEAL ECHOCARDIOGRAM (TEE);  Surgeon: Larey Dresser, MD;  Location: [place];  Service: Cardiovascular;  Laterality: N/A;  . TEE WITHOUT CARDIOVERSION N/A 10/28/2017   Procedure: TRANSESOPHAGEAL ECHOCARDIOGRAM (TEE);  Surgeon: Larey Dresser, MD;  Location: [facility] ENDOSCOPY;  Service: Cardiovascular;  Laterality: N/A;    Current Outpatient Medications  Medication Sig Dispense Refill  . apixaban (ELIQUIS) 5 MG TABS tablet Take 1 tablet (5 mg total) by mouth 2 (two) times daily. 60 tablet 6  . Cholecalciferol (VITAMIN D3) 2000 units capsule Take 1 capsule (2,000 Units total) by mouth daily. 100 capsule 3  . hydrochlorothiazide (HYDRODIURIL) 25 MG tablet Take  25 mg by mouth daily.    . metoprolol succinate (TOPROL-XL) 50 MG 24 hr tablet TAKE 1 TABLET (50 MG TOTAL) BY MOUTH 2 (TWO) TIMES DAILY. TAKE WITH OR IMMEDIATELY FOLLOWING A MEAL. 180 tablet 3   No current facility-administered medications for this encounter.     Allergies  Allergen Reactions  . Xarelto [Rivaroxaban] Other (See Comments) and Hypertension    INCREASED BP-HYPERTENSIVE EVENTS  . Corticosteroids Other (See Comments)    Made the patient  "sick," feel  "weird," and his "body rejected" them  . Ramipril Other (See Comments)    Could not eat or sleep, lost muscle mass  . Benazepril Cough    Social History   Socioeconomic History  . Marital status: Married    Spouse name: Not on file  . Number of children: 0  . Years of education: Not on file  . Highest education level: Not on file  Occupational History  . Occupation: Retired Lobbyist: [employer]  Social Needs  . Financial resource strain: Not on file  . Food insecurity:    Worry: Not on file    Inability: Not on file  . Transportation needs:    Medical: Not on file    Non-medical: Not on file  Tobacco Use  . Smoking status: Never Smoker  . Smokeless tobacco: Never Used  Substance and Sexual Activity  . Alcohol use: Yes    Comment: Drinks 1 glass of wine nightly/socially  . Drug use: No  . Sexual activity: Yes  Lifestyle  . Physical activity:    Days per week: Not on file    Minutes per session: Not on file  . Stress: Not on file  Relationships  . Social connections:    Talks on phone: Not on file    Gets together: Not on file    Attends religious service: Not on file    Active member of club or organization: Not on file    Attends meetings of clubs or organizations: Not on file    Relationship status: Not on file  . Intimate partner violence:    Fear of current or ex partner: Not on file    Emotionally abused: Not on file    Physically abused: Not on file    Forced sexual activity: Not on file  Other Topics Concern  . Not on file  Social History Narrative   Patient lives in [place] w/ his wife. He is a native of [place]. 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   Right-handed   Caffeine: 2 cups coffee per day    Family History  Problem Relation Age of Onset  . Colon cancer Mother 77  . Hypertension Other   . Coronary artery disease Neg Hx   . Colon polyps Neg Hx   . Esophageal cancer Neg Hx     . Rectal cancer Neg Hx   . Stomach cancer Neg Hx     ROS- All systems are reviewed and negative except as per the HPI above  Physical Exam: Vitals:   03/09/18 1330  BP: (!) 156/88  Pulse: 61  Weight: 199 lb 9.6 oz (90.5 kg)  Height: 5\' 11"  (1.803 m)   Wt Readings from Last 3 Encounters:  03/09/18 199 lb 9.6 oz (90.5 kg)  03/03/18 191 lb 4.8 oz (86.8 kg)  02/20/18 203 lb (92.1 kg)    Labs: Lab Results  Component Value Date  NA 143 03/03/2018   K 4.3 03/03/2018   CL 110 03/03/2018   CO2 28 03/03/2018   GLUCOSE 104 (H) 03/03/2018   BUN 16 03/03/2018   CREATININE 1.07 03/03/2018   CALCIUM 8.6 (L) 03/03/2018   MG 1.9 03/03/2018   Lab Results  Component Value Date   INR 1.14 08/02/2016   Lab Results  Component Value Date   CHOL 141 03/02/2018   HDL 22 (L) 03/02/2018   LDLCALC 92 03/02/2018   TRIG 137 03/02/2018     GEN- The patient is well appearing, alert and oriented x 3 today.   Head- normocephalic, atraumatic Eyes-  Sclera clear, conjunctiva pink Ears- hearing intact Oropharynx- clear Neck- supple, no JVP Lymph- no cervical lymphadenopathy Lungs- Clear to ausculation bilaterally, normal work of breathing Heart- Regular rate and rhythm, + for 2/6 sys murmur, rubs or gallops, PMI not laterally displaced GI- soft, NT, ND, + BS Extremities- no clubbing, cyanosis, or edema MS- no significant deformity or atrophy Skin- no rash or lesion Psych- euthymic mood, full affect Neuro- strength and sensation are intact  EKG-NSR at 61 bpm, pr int 202 ms, qrs int 96 ms, qtc 459 ms Epic records reviewed TEE-Study Conclusions  - Left ventricle: The cavity size was normal. Wall thickness was   increased in a pattern of moderate LVH. Systolic function was   normal. The estimated ejection fraction was in the range of 60%   to 65%. Wall motion was normal; there were no regional wall   motion abnormalities. - Aortic valve: Bicuspid, severely calcified aortic valve  with   moderate aortic stenosis. Mean gradient 28 mmHg, 1.2 cm^2. There   was no regurgitation. - Aorta: Aortic root measured 3.4 cm, 4.4 cm ascending aorta. - Mitral valve: There was mild regurgitation. - Left atrium: The atrium was moderately dilated. No evidence of   thrombus in the atrial cavity or appendage. - Right ventricle: The cavity size was normal. Systolic function   was normal. - Right atrium: The atrium was mildly dilated. - Tricuspid valve: Peak RV-RA gradient (S): 28 mm Hg.    Assessment and Plan: 1. Paroxysmal afib Pt is clear in his decision not to start Tikosyn at this time Continue metoprolol succinate 50 mg bid   Continue apixaban 5 mg bid  2. HTN Elevated in the office today but pt is very animated and frustrated with medical care in general Continue HCTZ/BB  F/u with Dr. Aundra Dubin in August per recall afib clinic as needed  Geroge Baseman. Taeler Winning, [facility] [address] [place], [place] [address] [phone]

## 2018-04-06 DIAGNOSIS — D122 Benign neoplasm of ascending colon: Secondary | ICD-10-CM | POA: Diagnosis not present

## 2018-04-12 ENCOUNTER — Telehealth (HOSPITAL_COMMUNITY): Payer: Self-pay | Admitting: *Deleted

## 2018-04-12 NOTE — Telephone Encounter (Signed)
Received surgical for GI procedure from [facility].  Patient may hold eliquis 2 days prior to procedure and if he has to hold it 5 days after procedure that is ok but Dr. Claris Gladden prefers him to restart as soon as possible.   Clearance faxed to [phone].

## 2018-05-15 ENCOUNTER — Other Ambulatory Visit (HOSPITAL_COMMUNITY): Payer: Self-pay | Admitting: Student

## 2018-05-19 DIAGNOSIS — Z23 Encounter for immunization: Secondary | ICD-10-CM | POA: Diagnosis not present

## 2018-05-31 ENCOUNTER — Other Ambulatory Visit (HOSPITAL_COMMUNITY): Payer: Self-pay | Admitting: Student

## 2018-06-06 ENCOUNTER — Other Ambulatory Visit (HOSPITAL_COMMUNITY): Payer: Self-pay | Admitting: Student

## 2018-07-06 DIAGNOSIS — Z6828 Body mass index (BMI) 28.0-28.9, adult: Secondary | ICD-10-CM | POA: Diagnosis not present

## 2018-07-06 DIAGNOSIS — M545 Low back pain: Secondary | ICD-10-CM | POA: Diagnosis not present

## 2018-07-06 DIAGNOSIS — M5416 Radiculopathy, lumbar region: Secondary | ICD-10-CM | POA: Diagnosis not present

## 2018-07-06 DIAGNOSIS — M47896 Other spondylosis, lumbar region: Secondary | ICD-10-CM | POA: Diagnosis not present

## 2018-07-17 DIAGNOSIS — D122 Benign neoplasm of ascending colon: Secondary | ICD-10-CM | POA: Diagnosis not present

## 2018-07-17 DIAGNOSIS — I4891 Unspecified atrial fibrillation: Secondary | ICD-10-CM | POA: Diagnosis not present

## 2018-07-17 DIAGNOSIS — Z7901 Long term (current) use of anticoagulants: Secondary | ICD-10-CM | POA: Diagnosis not present

## 2018-07-17 DIAGNOSIS — D123 Benign neoplasm of transverse colon: Secondary | ICD-10-CM | POA: Diagnosis not present

## 2018-07-17 DIAGNOSIS — E785 Hyperlipidemia, unspecified: Secondary | ICD-10-CM | POA: Diagnosis not present

## 2018-07-17 DIAGNOSIS — I10 Essential (primary) hypertension: Secondary | ICD-10-CM | POA: Diagnosis not present

## 2018-07-17 HISTORY — PX: COLONOSCOPY: SHX174

## 2018-07-21 ENCOUNTER — Encounter (HOSPITAL_COMMUNITY): Payer: Self-pay | Admitting: Emergency Medicine

## 2018-07-21 ENCOUNTER — Other Ambulatory Visit: Payer: Self-pay

## 2018-07-21 ENCOUNTER — Inpatient Hospital Stay (HOSPITAL_COMMUNITY)
Admission: EM | Admit: 2018-07-21 | Discharge: 2018-07-26 | DRG: 920 | Disposition: A | Discharge status: Home / Self Care | Payer: Medicare Other | Attending: Internal Medicine | Admitting: Internal Medicine

## 2018-07-21 DIAGNOSIS — Z86010 Personal history of colonic polyps: Secondary | ICD-10-CM

## 2018-07-21 DIAGNOSIS — I10 Essential (primary) hypertension: Secondary | ICD-10-CM | POA: Diagnosis not present

## 2018-07-21 DIAGNOSIS — R188 Other ascites: Secondary | ICD-10-CM | POA: Diagnosis not present

## 2018-07-21 DIAGNOSIS — K91840 Postprocedural hemorrhage and hematoma of a digestive system organ or structure following a digestive system procedure: Secondary | ICD-10-CM | POA: Diagnosis not present

## 2018-07-21 DIAGNOSIS — R58 Hemorrhage, not elsewhere classified: Secondary | ICD-10-CM | POA: Diagnosis not present

## 2018-07-21 DIAGNOSIS — I480 Paroxysmal atrial fibrillation: Secondary | ICD-10-CM | POA: Diagnosis not present

## 2018-07-21 DIAGNOSIS — I5032 Chronic diastolic (congestive) heart failure: Secondary | ICD-10-CM | POA: Diagnosis not present

## 2018-07-21 DIAGNOSIS — M1990 Unspecified osteoarthritis, unspecified site: Secondary | ICD-10-CM | POA: Diagnosis present

## 2018-07-21 DIAGNOSIS — Z888 Allergy status to other drugs, medicaments and biological substances status: Secondary | ICD-10-CM

## 2018-07-21 DIAGNOSIS — K921 Melena: Secondary | ICD-10-CM | POA: Diagnosis not present

## 2018-07-21 DIAGNOSIS — Z8249 Family history of ischemic heart disease and other diseases of the circulatory system: Secondary | ICD-10-CM

## 2018-07-21 DIAGNOSIS — Z7901 Long term (current) use of anticoagulants: Secondary | ICD-10-CM

## 2018-07-21 DIAGNOSIS — Q231 Congenital insufficiency of aortic valve: Secondary | ICD-10-CM | POA: Diagnosis not present

## 2018-07-21 DIAGNOSIS — K760 Fatty (change of) liver, not elsewhere classified: Secondary | ICD-10-CM | POA: Diagnosis present

## 2018-07-21 DIAGNOSIS — I350 Nonrheumatic aortic (valve) stenosis: Secondary | ICD-10-CM | POA: Diagnosis present

## 2018-07-21 DIAGNOSIS — K922 Gastrointestinal hemorrhage, unspecified: Secondary | ICD-10-CM | POA: Diagnosis present

## 2018-07-21 DIAGNOSIS — Z79899 Other long term (current) drug therapy: Secondary | ICD-10-CM

## 2018-07-21 DIAGNOSIS — D696 Thrombocytopenia, unspecified: Secondary | ICD-10-CM | POA: Diagnosis present

## 2018-07-21 DIAGNOSIS — F419 Anxiety disorder, unspecified: Secondary | ICD-10-CM | POA: Diagnosis present

## 2018-07-21 DIAGNOSIS — D62 Acute posthemorrhagic anemia: Secondary | ICD-10-CM | POA: Diagnosis not present

## 2018-07-21 DIAGNOSIS — E876 Hypokalemia: Secondary | ICD-10-CM | POA: Diagnosis not present

## 2018-07-21 DIAGNOSIS — Z7902 Long term (current) use of antithrombotics/antiplatelets: Secondary | ICD-10-CM

## 2018-07-21 DIAGNOSIS — Y838 Other surgical procedures as the cause of abnormal reaction of the patient, or of later complication, without mention of misadventure at the time of the procedure: Secondary | ICD-10-CM | POA: Diagnosis present

## 2018-07-21 DIAGNOSIS — I110 Hypertensive heart disease with heart failure: Secondary | ICD-10-CM | POA: Diagnosis present

## 2018-07-21 DIAGNOSIS — Z8701 Personal history of pneumonia (recurrent): Secondary | ICD-10-CM

## 2018-07-21 DIAGNOSIS — R55 Syncope and collapse: Secondary | ICD-10-CM | POA: Diagnosis not present

## 2018-07-21 DIAGNOSIS — K5900 Constipation, unspecified: Secondary | ICD-10-CM | POA: Diagnosis not present

## 2018-07-21 DIAGNOSIS — Z800 Family history of malignant neoplasm of digestive organs: Secondary | ICD-10-CM

## 2018-07-21 HISTORY — DX: Gastrointestinal hemorrhage, unspecified: K92.2

## 2018-07-21 HISTORY — DX: Acute posthemorrhagic anemia: D62

## 2018-07-21 LAB — CBC WITH DIFFERENTIAL/PLATELET
Abs Immature Granulocytes: 0.01 10*3/uL (ref 0.00–0.07)
Basophils Absolute: 0 10*3/uL (ref 0.0–0.1)
Basophils Relative: 0 %
Eosinophils Absolute: 0.1 10*3/uL (ref 0.0–0.5)
Eosinophils Relative: 2 %
HCT: 37.5 % — ABNORMAL LOW (ref 39.0–52.0)
Hemoglobin: 12 g/dL — ABNORMAL LOW (ref 13.0–17.0)
Immature Granulocytes: 0 %
Lymphocytes Relative: 21 %
Lymphs Abs: 1.1 10*3/uL (ref 0.7–4.0)
MCH: 30.6 pg (ref 26.0–34.0)
MCHC: 32 g/dL (ref 30.0–36.0)
MCV: 95.7 fL (ref 80.0–100.0)
Monocytes Absolute: 0.5 10*3/uL (ref 0.1–1.0)
Monocytes Relative: 10 %
Neutro Abs: 3.6 10*3/uL (ref 1.7–7.7)
Neutrophils Relative %: 67 %
Platelets: 144 10*3/uL — ABNORMAL LOW (ref 150–400)
RBC: 3.92 MIL/uL — ABNORMAL LOW (ref 4.22–5.81)
RDW: 12.5 % (ref 11.5–15.5)
WBC: 5.4 10*3/uL (ref 4.0–10.5)
nRBC: 0 % (ref 0.0–0.2)

## 2018-07-21 LAB — BASIC METABOLIC PANEL
Anion gap: 9 (ref 5–15)
BUN: 17 mg/dL (ref 8–23)
CO2: 23 mmol/L (ref 22–32)
Calcium: 8.7 mg/dL — ABNORMAL LOW (ref 8.9–10.3)
Chloride: 108 mmol/L (ref 98–111)
Creatinine, Ser: 0.99 mg/dL (ref 0.61–1.24)
GFR calc Af Amer: >60 mL/min (ref >60)
GFR calc non Af Amer: >60 mL/min (ref >60)
Glucose, Bld: 125 mg/dL — ABNORMAL HIGH (ref 70–99)
Potassium: 3.8 mmol/L (ref 3.5–5.1)
Sodium: 140 mmol/L (ref 135–145)

## 2018-07-21 LAB — HEMOGLOBIN AND HEMATOCRIT, BLOOD
HCT: 34.8 % — ABNORMAL LOW (ref 39.0–52.0)
HCT: 32.1 % — ABNORMAL LOW (ref 39.0–52.0)
Hemoglobin: 11.2 g/dL — ABNORMAL LOW (ref 13.0–17.0)
Hemoglobin: 10.7 g/dL — ABNORMAL LOW (ref 13.0–17.0)

## 2018-07-21 LAB — POC OCCULT BLOOD, ED: Fecal Occult Bld: POSITIVE — AB

## 2018-07-21 LAB — ABO/RH: ABO/RH(D): AB NEG

## 2018-07-21 LAB — PROTIME-INR
INR: 1.15
Prothrombin Time: 14.6 seconds (ref 11.4–15.2)

## 2018-07-21 MED ORDER — VITAMIN D3 50 MCG (2000 UT) PO CAPS: 2000.0000 [IU] | ORAL_CAPSULE | Freq: Every day | ORAL | Status: DC

## 2018-07-21 MED ORDER — ONDANSETRON HCL 4 MG/2ML IJ SOLN: 4.0000 mg | Freq: Four times a day (QID) | INTRAMUSCULAR | Status: DC | PRN

## 2018-07-21 MED ORDER — ONDANSETRON HCL 4 MG PO TABS: 4.0000 mg | ORAL_TABLET | Freq: Four times a day (QID) | ORAL | Status: DC | PRN

## 2018-07-21 MED ORDER — HYDROCHLOROTHIAZIDE 25 MG PO TABS: 25.0000 mg | ORAL_TABLET | Freq: Every day | ORAL | Status: DC

## 2018-07-21 MED ORDER — ACETAMINOPHEN 650 MG RE SUPP: 650.0000 mg | Freq: Four times a day (QID) | RECTAL | Status: DC | PRN

## 2018-07-21 MED ORDER — VITAMIN D 25 MCG (1000 UNIT) PO TABS
2000.0000 [IU] | ORAL_TABLET | Freq: Every day | ORAL | Status: DC
  Administered 2018-07-22 – 2018-07-26 (×5): 2000 [IU] via ORAL

## 2018-07-21 MED ORDER — METOPROLOL SUCCINATE ER 25 MG PO TB24
25.0000 mg | ORAL_TABLET | Freq: Two times a day (BID) | ORAL | Status: DC
  Administered 2018-07-21 – 2018-07-23 (×4): 25 mg via ORAL
  Filled 2018-07-21 (×4): qty 1

## 2018-07-21 MED ORDER — METOPROLOL SUCCINATE ER 50 MG PO TB24
50.0000 mg | ORAL_TABLET | Freq: Two times a day (BID) | ORAL | Status: DC
  Filled 2018-07-21: qty 1

## 2018-07-21 MED ORDER — ACETAMINOPHEN 325 MG PO TABS: 650.0000 mg | ORAL_TABLET | Freq: Four times a day (QID) | ORAL | Status: DC | PRN

## 2018-07-21 MED ORDER — LACTATED RINGERS IV SOLN
INTRAVENOUS | Status: DC
  Administered 2018-07-21 – 2018-07-22 (×3): via INTRAVENOUS

## 2018-07-21 NOTE — Consult Note (Addendum)
[facility] Consult: 1:15 PM 07/21/2018  LOS: 0 days    Referring Provider: Dr Earnest Conroy  Primary Care Physician:  Alain Marion [name], MD Primary Gastroenterologist:  Dr. Hilarie Fredrickson.       Reason for Consultation:  Hematochezia post polypectomy.    HPI: [name] [name] is a 77 y.o. male.  Hx PAF, on Eliquis.  Previous cardioversions.  Hypertension.  Bicuspid aortic valve.  Moderate aortic stenosis.  A sending aortic aneurysm.  Ultrasound 01/2018 to evaluate LUQ pain showed mildly fatty liver, ascites, patent portal vein with normal directional flow.   10/2017 Colonoscopy, Dr Hilarie Fredrickson.  Surveillance study for history of colon polyps.  There were multiple polyps, 6 in total.  1 of these at the hepatic flexure measured 25 to 30 mm and was biopsied and tattooed.  The others measured 2 4 mm.  Internal hemorrhoids noted 02/03/18 Colonoscopy At [facility].  Dr Tillie Rung.  Attempted EMR unsuccessful because of the flat, extensive nature of the polyp.  A small amount of tissue was resected and sent for pathology.  Internal hemorrhoids noted.  Endoscopist recommended surgical referral for removal of the large polyp and he was referred back to Dr. Hilarie Fredrickson. Pathology revealed the large polyp was a tubular adenoma without HGD.  Smaller polyps were tubular adenomas without HGD, sessile serrated polyp without dysplasia, hyperplastic polyp.   Patient wished to avoid surgical resection and was referred to Dr. Stephanie Acre at [facility].  He underwent  07/17/18 outpatient colonoscopy injected with partial lift of the lesion from the muscularis propria. Snare mucosal resection was performed and hot avulsion with hot biopsy forceps. A 50 mm area was resected. Resection was complete, but the tissue was only partially retrieved. Borders were treated with hot  biopsy forceps. There was no bleeding at the end of the procedure.2 sessile serrated, 19mm polyps in the ascending colon. Polypectomy was not attempted.   His Eliquis was on hold for this procedure and he had not yet restarted it, it was to be held for 4 to 5 days post procedure.  Patient had the procedure on Monday.  On Tuesday when he had a otherwise normal bowel movement he saw what looked like may be a spot of blood in the commode water which did not concern him he thought it was probably expected given the extent of his polypectomy.  On Wednesday he had a brown stool and no visible blood.  1 PM on Thursday he had a episode of fairly large amount of hematochezia and called Dr Marquis Buggy office.  He was advised to keep an eye on things but they felt that it may resolve on its own.  This morning at about 630 he had a couple of very large volume maroon, bloody stools with clots of blood.  He felt very dizzy but did not think up eyes.  He even took his pulse which was 56.  Felt a little bit nauseous and regurgitated some clear, white material.  EMS transported him to the [facility] emergency room. Hgb 12 >> 11.2.  It was 14 in 02/2018.  Platelets slightly low at 144.  As of yet no PT/INR.  Patient is originally from [place] but speaks good [place].  He is retired from [employer], his last job was at [employer].  He is lived in the [place] for 40+ years.  Does not use NSAIDs or aspirin products.  Most nights he has a glass of wine  Past Medical History:  Diagnosis Date  . Adenomatous colon polyp   . Ascending aortic aneurysm ([place])   . Bicuspid aortic valve   . Chest pain    ETT-myoview 12/11 w/exercise, no chest pain, no significant ST changes, EF 69%, no evidence for ischemia or infarction.  . CHF NYHA class I (no symptoms from ordinary activities), acute, diastolic ([place])   . GI bleeding 07/21/2018  . Hemorrhoids   . HTN (hypertension)   . Hypercholesteremia   . LBP (low back pain)   .  Moderate aortic stenosis   . Osteoarthritis   . Paroxysmal atrial fibrillation ([place])    a. new onset Afib in 07/2008. He underwent ibutilide cardioversion successfully. b. Recurrence 01/2013 s/p TEE/DCCV - was on Xarelto but he stopped it as he was convinced it was causing joint pn. c. Recurrence 01/2016 - spont conv to NSR. Pt took Eliquis x1 mo then declined further anticoag. d. Recurrence 07/2016.  [name] Pneumonia     Past Surgical History:  Procedure Laterality Date  . BACK SURGERY  x12 years ago  . CARDIOVERSION N/A 01/26/2013   Procedure: CARDIOVERSION;  Surgeon: Larey Dresser, MD;  Location: [facility] ENDOSCOPY;  Service: Cardiovascular;  Laterality: N/A;  . CARDIOVERSION N/A 10/28/2017   Procedure: CARDIOVERSION;  Surgeon: Larey Dresser, MD;  Location: [facility] ENDOSCOPY;  Service: Cardiovascular;  Laterality: N/A;  . CARDIOVERSION N/A 03/03/2018   Procedure: CARDIOVERSION;  Surgeon: Lelon Perla, MD;  Location: [facility] ENDOSCOPY;  Service: Cardiovascular;  Laterality: N/A;  . COLONOSCOPY    . COLONOSCOPY  07/17/2018   at [facility]  . HEMORRHOID SURGERY    . LUMBAR LAMINECTOMY    . POLYPECTOMY    . TEE WITHOUT CARDIOVERSION N/A 01/26/2013   Procedure: TRANSESOPHAGEAL ECHOCARDIOGRAM (TEE);  Surgeon: Larey Dresser, MD;  Location: [place];  Service: Cardiovascular;  Laterality: N/A;  . TEE WITHOUT CARDIOVERSION N/A 10/28/2017   Procedure: TRANSESOPHAGEAL ECHOCARDIOGRAM (TEE);  Surgeon: Larey Dresser, MD;  Location: [facility] ENDOSCOPY;  Service: Cardiovascular;  Laterality: N/A;    Prior to Admission medications   Medication Sig Start Date End Date Taking? Authorizing Provider  Cholecalciferol (VITAMIN D3) 2000 units capsule Take 1 capsule (2,000 Units total) by mouth daily. 01/11/18  Yes [name], [name], MD  ELIQUIS 5 MG TABS tablet TAKE 1 TABLET BY MOUTH TWICE A DAY Patient taking differently: Take 5 mg by mouth 2 (two) times daily.  05/16/18  Yes Shirley Friar, PA-C  hydrochlorothiazide  (HYDRODIURIL) 25 MG tablet TAKE 1 TABLET BY MOUTH EVERY DAY Patient taking differently: Take 25 mg by mouth daily.  06/06/18  Yes Shirley Friar, PA-C  metoprolol succinate (TOPROL-XL) 50 MG 24 hr tablet TAKE 1 TABLET (50 MG TOTAL) BY MOUTH 2 (TWO) TIMES DAILY. TAKE WITH OR IMMEDIATELY FOLLOWING A MEAL. Patient taking differently: Take 50 mg by mouth 2 (two) times daily.  01/11/18  Yes Larey Dresser, MD    Scheduled Meds: . cholecalciferol  2,000 Units Oral Daily  . metoprolol succinate  25 mg Oral BID   Infusions: . lactated ringers 75 mL/hr at 07/21/18 1225  PRN Meds: acetaminophen **OR** acetaminophen, ondansetron **OR** ondansetron (ZOFRAN) IV   Allergies as of 07/21/2018 - Review Complete 07/21/2018  Allergen Reaction Noted  . Xarelto [rivaroxaban] Other (See Comments) and Hypertension 08/02/2016  . Corticosteroids Other (See Comments) 04/06/2018  . Ramipril Other (See Comments) 10/27/2017  . Benazepril Cough 10/10/2012    Family History  Problem Relation Age of Onset  . Colon cancer Mother 59  . Hypertension Other   . Coronary artery disease Neg Hx   . Colon polyps Neg Hx   . Esophageal cancer Neg Hx   . Rectal cancer Neg Hx   . Stomach cancer Neg Hx     Social History   Socioeconomic History  . Marital status: Married    Spouse name: Not on file  . Number of children: 0  . Years of education: Not on file  . Highest education level: Not on file  Occupational History  . Occupation: Retired Lobbyist: [employer]  Social Needs  . Financial resource strain: Not on file  . Food insecurity:    Worry: Not on file    Inability: Not on file  . Transportation needs:    Medical: Not on file    Non-medical: Not on file  Tobacco Use  . Smoking status: Never Smoker  . Smokeless tobacco: Never Used  Substance and Sexual Activity  . Alcohol use: Yes    Comment: Drinks 1 glass of wine nightly/socially  . Drug use: No  . Sexual  activity: Yes  Lifestyle  . Physical activity:    Days per week: Not on file    Minutes per session: Not on file  . Stress: Not on file  Relationships  . Social connections:    Talks on phone: Not on file    Gets together: Not on file    Attends religious service: Not on file    Active member of club or organization: Not on file    Attends meetings of clubs or organizations: Not on file    Relationship status: Not on file  . Intimate partner violence:    Fear of current or ex partner: Not on file    Emotionally abused: Not on file    Physically abused: Not on file    Forced sexual activity: Not on file  Other Topics Concern  . Not on file  Social History Narrative   Patient lives in [place] w/ his wife. He is a native of [place]. 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   Right-handed   Caffeine: 2 cups coffee per day    REVIEW OF SYSTEMS: Constitutional:  Per HPI.  Although he felt weak this morning, yesterday he was out raking leaves in his yard and felt fine ENT:  No nose bleeds Pulm: Shortness of breath.  No cough. CV:  No palpitations, no LE edema.  GU:  No hematuria, no frequency GI:  Per HPI Heme: Other than the lower GI bleeding.  He does not report any unusual or excessive bleeding or bruising. Transfusions: None Neuro: Dizziness, presyncope but no actual falling out/syncope. Derm:  No itching, no rash or sores.  Endocrine:  No sweats or chills.  No polyuria or dysuria Immunization: Viewed.  He is current on multiple vaccinations including his flu shot.    PHYSICAL EXAM: Vital signs in last 24 hours: Vitals:   07/21/18 1000 07/21/18 1211  BP: 113/68 131/62  Pulse:  69  Resp: 17 18  Temp:  97.7 F (36.5 C)  SpO2:  100%   Wt Readings from Last 3 Encounters:  03/09/18 90.5 kg  03/03/18 86.8 kg  02/20/18 92.1 kg    General: Pleasant, alert, well-appearing WM, looks his stated age. Head: No facial asymmetry or  swelling.  No signs of head trauma. Eyes: No conjunctival pallor Ears: Not hard of hearing Nose: No discharge Mouth: Oropharynx moist, pink, clear.  Tongue midline. Neck: No JVD, masses, bruits, thyromegaly Lungs: Clear bilaterally.  No cough or labored breathing. Heart: RRR.  Soft systolic murmur.  S1, S2 present. Abdomen: Soft.  Not tender or distended.  Active bowel sounds.  No HSM, bruits, hernias.   Rectal: Deferred.  DRE by the ED provider revealed light pink liquid on DRE, this tested FOBT positive. Musc/Skeltl: No joint redness, swelling or gross deformities. Extremities: No CCE. Neurologic: Fully alert and oriented.  Very detailed historian.  Moves all 4 limbs, no weakness.  No tremors. Skin: No telangiectasia, sores, rashes. Tattoos: None Nodes: No cervical adenopathy Psych: Calm, pleasant, cooperative.  Fluid speech.  Intake/Output from previous day: No intake/output data recorded. Intake/Output this shift: No intake/output data recorded.  LAB RESULTS: Recent Labs    07/21/18 0826 07/21/18 1130  WBC 5.4  --   HGB 12.0* 11.2*  HCT 37.5* 34.8*  PLT 144*  --    BMET Lab Results  Component Value Date   NA 140 07/21/2018   NA 143 03/03/2018   NA 143 03/02/2018   K 3.8 07/21/2018   K 4.3 03/03/2018   K 4.1 03/02/2018   CL 108 07/21/2018   CL 110 03/03/2018   CL 110 03/02/2018   CO2 23 07/21/2018   CO2 28 03/03/2018   CO2 27 03/02/2018   GLUCOSE 125 (H) 07/21/2018   GLUCOSE 104 (H) 03/03/2018   GLUCOSE 120 (H) 03/02/2018   BUN 17 07/21/2018   BUN 16 03/03/2018   BUN 15 03/02/2018   CREATININE 0.99 07/21/2018   CREATININE 1.07 03/03/2018   CREATININE 0.91 03/02/2018   CALCIUM 8.7 (L) 07/21/2018   CALCIUM 8.6 (L) 03/03/2018   CALCIUM 8.9 03/02/2018   LFT No results for input(s): PROT, ALBUMIN, AST, ALT, ALKPHOS, BILITOT, BILIDIR, IBILI in the last 72 hours. PT/INR Lab Results  Component Value Date   INR 1.14 08/02/2016   INR 1.00 01/26/2016   INR  1.11 01/24/2013   RADIOLOGY STUDIES: No results found.    IMPRESSION:   *    Lower GI bleed from post polypectomy source.  Patient just underwent colonoscopy with EMR of a large, flat, spreading adenomatous polyp  *   Mild, normocytic blood loss anemia.  Does not require transfusions at present.  *    Mild thrombocytopenia.  This first occurred in 10/2016 but platelets had subsequently normalized.  *   Fatty liver, ascites on ultrasound in 01/2018.   PLAN:     *   No plans for colonoscopy today.  Dr. Ardis Hughs may decide to do this tomorrow but he needs to see the patient first. Ordered clear liquids.  *   To be sure it is normal, obtain PT/INR.  Hgb/hematocrit ordered 5 AM 5 PM.  Will get CBC in place of H&H in the morning.   Azucena Freed  07/21/2018, 1:15 PM Phone [phone]

## 2018-07-21 NOTE — H&P (Signed)
History and Physical    DOA: 07/21/2018  PCP: Cassandria Anger, MD  Patient coming from: Home  Chief Complaint: BRBPR X 1 day  HPI: [name] [name] is a 77 y.o. male with history h/o hypertension, hypercholesterolemia, paroxysmal atrial fibrillation on Eliquis at baseline who recently underwent colon polypectomy at [facility] on November 4 presents with complaints of bright red blood per rectum since yesterday.  Patient was referred to [facility] for a large 50 mm adenomatous colon polyp at the hepatic flexure and high risk polypectomy.  Patient underwent colonoscopy as same day surgery on November 4 and was noted to have two 3 mm polyps in the ascending colon which were not removed but underwent hot snare polypectomy of the 50 mm polyp at the hepatic flexure.  According to [facility] report, borders were treated with hot biopsy forceps and there was no active bleeding noted during the procedure.  Patient was discharged home and was advised to hold anticoagulation for 4 to 5 days and to resume regular diet.  Patient states he has been eating mostly soups and soft diet.  He was doing well until yesterday when he noticed "small amount of light red blood" mixed in stool.  However this morning he had to use the toilet twice and passed large amount of dark blood with clots which made him dizzy and lightheaded.  He has not had any further bloody bowel movements since presented to the ED.  His blood pressure has been low normal.  He is awake alert oriented x3 and able to provide history.  His hemoglobin is at 12 as compared to 14 at baseline.  ED physician discussed with Dr. Stephanie Acre with [facility] who recommended holding anticoagulation and observation.  Patient also known to Dr. Hilarie Fredrickson with GI here with home ED physician plans to get in touch with.  Review of Systems: As per HPI otherwise 10 point review of systems negative.    Past Medical History:  Diagnosis Date  . Adenomatous colon polyp   . Ascending aortic aneurysm  ([place])   . Bicuspid aortic valve   . Chest pain    ETT-myoview 12/11 w/exercise, no chest pain, no significant ST changes, EF 69%, no evidence for ischemia or infarction.  . CHF NYHA class I (no symptoms from ordinary activities), acute, diastolic ([place])   . Hemorrhoids   . HTN (hypertension)   . Hypercholesteremia   . LBP (low back pain)   . Moderate aortic stenosis   . Osteoarthritis   . Paroxysmal atrial fibrillation ([place])    a. new onset Afib in 07/2008. He underwent ibutilide cardioversion successfully. b. Recurrence 01/2013 s/p TEE/DCCV - was on Xarelto but he stopped it as he was convinced it was causing joint pn. c. Recurrence 01/2016 - spont conv to NSR. Pt took Eliquis x1 mo then declined further anticoag. d. Recurrence 07/2016.  [name] Pneumonia     Past Surgical History:  Procedure Laterality Date  . BACK SURGERY  x12 years ago  . CARDIOVERSION N/A 01/26/2013   Procedure: CARDIOVERSION;  Surgeon: Larey Dresser, MD;  Location: [facility] ENDOSCOPY;  Service: Cardiovascular;  Laterality: N/A;  . CARDIOVERSION N/A 10/28/2017   Procedure: CARDIOVERSION;  Surgeon: Larey Dresser, MD;  Location: [facility] ENDOSCOPY;  Service: Cardiovascular;  Laterality: N/A;  . CARDIOVERSION N/A 03/03/2018   Procedure: CARDIOVERSION;  Surgeon: Lelon Perla, MD;  Location: [facility] ENDOSCOPY;  Service: Cardiovascular;  Laterality: N/A;  . COLONOSCOPY    . HEMORRHOID SURGERY    .  LUMBAR LAMINECTOMY    . POLYPECTOMY    . TEE WITHOUT CARDIOVERSION N/A 01/26/2013   Procedure: TRANSESOPHAGEAL ECHOCARDIOGRAM (TEE);  Surgeon: Larey Dresser, MD;  Location: [place];  Service: Cardiovascular;  Laterality: N/A;  . TEE WITHOUT CARDIOVERSION N/A 10/28/2017   Procedure: TRANSESOPHAGEAL ECHOCARDIOGRAM (TEE);  Surgeon: Larey Dresser, MD;  Location: [facility] ENDOSCOPY;  Service: Cardiovascular;  Laterality: N/A;    Social history:  reports that he has never smoked. He has never used smokeless tobacco. He reports that he drinks  alcohol. He reports that he does not use drugs.   Allergies  Allergen Reactions  . Xarelto [Rivaroxaban] Other (See Comments) and Hypertension    INCREASED BP-HYPERTENSIVE EVENTS  . Corticosteroids Other (See Comments)    Made the patient  "sick," feel "weird," and his "body rejected" them Other reaction(s): Other (See Comments) Made the patient "sick," feel "weird," and his "body rejected" them  . Ramipril Other (See Comments)    Could not eat or sleep, lost muscle mass  . Benazepril Cough    Family History  Problem Relation Age of Onset  . Colon cancer Mother 12  . Hypertension Other   . Coronary artery disease Neg Hx   . Colon polyps Neg Hx   . Esophageal cancer Neg Hx   . Rectal cancer Neg Hx   . Stomach cancer Neg Hx       Prior to Admission medications   Medication Sig Start Date End Date Taking? Authorizing Provider  Cholecalciferol (VITAMIN D3) 2000 units capsule Take 1 capsule (2,000 Units total) by mouth daily. 01/11/18  Yes [name], [name], MD  ELIQUIS 5 MG TABS tablet TAKE 1 TABLET BY MOUTH TWICE A DAY Patient taking differently: Take 5 mg by mouth 2 (two) times daily.  05/16/18  Yes Shirley Friar, PA-C  hydrochlorothiazide (HYDRODIURIL) 25 MG tablet TAKE 1 TABLET BY MOUTH EVERY DAY Patient taking differently: Take 25 mg by mouth daily.  06/06/18  Yes Shirley Friar, PA-C  metoprolol succinate (TOPROL-XL) 50 MG 24 hr tablet TAKE 1 TABLET (50 MG TOTAL) BY MOUTH 2 (TWO) TIMES DAILY. TAKE WITH OR IMMEDIATELY FOLLOWING A MEAL. Patient taking differently: Take 50 mg by mouth 2 (two) times daily.  01/11/18  Yes Larey Dresser, MD    Physical Exam: Vitals:   07/21/18 0915 07/21/18 0930 07/21/18 0945 07/21/18 1000  BP: 102/60 118/62 (!) 109/57 113/68  Pulse: (!) 55 (!) 56    Resp: 18 13 16 17   Temp:      TempSrc:      SpO2: 97% 100%      Constitutional: NAD, calm, comfortable Vitals:   07/21/18 0915 07/21/18 0930 07/21/18 0945 07/21/18 1000   BP: 102/60 118/62 (!) 109/57 113/68  Pulse: (!) 55 (!) 56    Resp: 18 13 16 17   Temp:      TempSrc:      SpO2: 97% 100%     Eyes: PERRL, lids and conjunctivae normal ENMT: Mucous membranes are moist. Posterior pharynx clear of any exudate or lesions.Normal dentition.  Neck: normal, supple, no masses, no thyromegaly Respiratory: clear to auscultation bilaterally, no wheezing, no crackles. Normal respiratory effort. No accessory muscle use.  Cardiovascular: Regular rate and rhythm, no murmurs / rubs / gallops. No extremity edema. 2+ pedal pulses. No carotid bruits.  Abdomen: no tenderness, no masses palpated. No hepatosplenomegaly. Bowel sounds positive.  Musculoskeletal: no clubbing / cyanosis. No joint deformity upper and lower extremities. Good ROM, no  contractures. Normal muscle tone.  Neurologic: CN 2-12 grossly intact. Sensation intact, DTR normal. Strength 5/5 in all 4.  Psychiatric: Normal judgment and insight. Alert and oriented x 3. Normal mood.  SKIN/catheters: no rashes, lesions, ulcers. No induration  Labs on Admission: I have personally reviewed following labs and imaging studies  CBC: Recent Labs  Lab 07/21/18 0826  WBC 5.4  NEUTROABS 3.6  HGB 12.0*  HCT 37.5*  MCV 95.7  PLT 275*   Basic Metabolic Panel: Recent Labs  Lab 07/21/18 0826  NA 140  K 3.8  CL 108  CO2 23  GLUCOSE 125*  BUN 17  CREATININE 0.99  CALCIUM 8.7*   GFR: CrCl cannot be calculated (Unknown ideal weight.). Liver Function Tests: No results for input(s): AST, ALT, ALKPHOS, BILITOT, PROT, ALBUMIN in the last 168 hours. No results for input(s): LIPASE, AMYLASE in the last 168 hours. No results for input(s): AMMONIA in the last 168 hours. Coagulation Profile: No results for input(s): INR, PROTIME in the last 168 hours. Cardiac Enzymes: No results for input(s): CKTOTAL, CKMB, CKMBINDEX, TROPONINI in the last 168 hours. BNP (last 3 results) No results for input(s): PROBNP in the last  8760 hours. HbA1C: No results for input(s): HGBA1C in the last 72 hours. CBG: No results for input(s): GLUCAP in the last 168 hours. Lipid Profile: No results for input(s): CHOL, HDL, LDLCALC, TRIG, CHOLHDL, LDLDIRECT in the last 72 hours. Thyroid Function Tests: No results for input(s): TSH, T4TOTAL, FREET4, T3FREE, THYROIDAB in the last 72 hours. Anemia Panel: No results for input(s): VITAMINB12, FOLATE, FERRITIN, TIBC, IRON, RETICCTPCT in the last 72 hours. Urine analysis:    Component Value Date/Time   COLORURINE YELLOW 10/21/2016 2355   APPEARANCEUR HAZY (A) 10/21/2016 2355   LABSPEC 1.017 10/21/2016 2355   PHURINE 5.0 10/21/2016 2355   GLUCOSEU NEGATIVE 10/21/2016 2355   GLUCOSEU NEGATIVE 11/19/2015 1352   HGBUR SMALL (A) 10/21/2016 2355   BILIRUBINUR NEGATIVE 10/21/2016 2355   KETONESUR 5 (A) 10/21/2016 2355   PROTEINUR NEGATIVE 10/21/2016 2355   UROBILINOGEN 0.2 11/19/2015 1352   NITRITE NEGATIVE 10/21/2016 2355   LEUKOCYTESUR NEGATIVE 10/21/2016 2355    Radiological Exams on Admission: No results found.     Assessment and Plan:   1.  Post polypectomy lower GI bleed: Watch for recurrence of hematochezia.  Monitor serial hemoglobins and transfuse as needed.  Patient verbally consented for blood transfusion if needed.  Currently hemoglobin at 12 but may need transfusion if patient has hemodynamically compromising GI bleed.  Will keep n.p.o. for today and give IV hydration.  May start clear liquid diet in a.m. if no further bleeding and no plans for endoscopy.  ED physician to contact GI to follow patient while here.  2.  Paroxysmal atrial fibrillation: Anticoagulation on hold.  Patient had not resumed postprocedure.  He reports holding anticoagulation for a week prior to procedure as well.  Resume beta-blockers with holding parameters  3.  Hypertension: Resume beta-blockers, hold HCTZ given borderline blood pressure and problem #1   DVT prophylaxis: SCD  Code  Status: Full code  Family Communication: Discussed with patient and wife bedside Consults called: [facility] Admission status:  Patient admitted as observation as anticipated LOS less than 2 midnights    Guilford Shi MD [facility] Pager [phone]  If 7PM-7AM, please contact night-coverage [email or web address] Password [email or web address]  07/21/2018, 11:15 AM

## 2018-07-21 NOTE — ED Notes (Signed)
Dr. Stephanie Acre called to [id number]-per Dr. Ian Bushman by Levada Dy

## 2018-07-21 NOTE — ED Triage Notes (Signed)
Pt here from home with c/o rectal bleed , pt had a coloscopy on the 4h at [facility] , pt developed some rectal bleeding over the last two days
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
## 2018-07-22 ENCOUNTER — Encounter (HOSPITAL_COMMUNITY): Payer: Self-pay | Admitting: Internal Medicine

## 2018-07-22 DIAGNOSIS — R188 Other ascites: Secondary | ICD-10-CM | POA: Diagnosis not present

## 2018-07-22 DIAGNOSIS — Z8701 Personal history of pneumonia (recurrent): Secondary | ICD-10-CM | POA: Diagnosis not present

## 2018-07-22 DIAGNOSIS — I110 Hypertensive heart disease with heart failure: Secondary | ICD-10-CM | POA: Diagnosis not present

## 2018-07-22 DIAGNOSIS — I350 Nonrheumatic aortic (valve) stenosis: Secondary | ICD-10-CM | POA: Diagnosis present

## 2018-07-22 DIAGNOSIS — K922 Gastrointestinal hemorrhage, unspecified: Secondary | ICD-10-CM | POA: Diagnosis not present

## 2018-07-22 DIAGNOSIS — K5900 Constipation, unspecified: Secondary | ICD-10-CM | POA: Diagnosis not present

## 2018-07-22 DIAGNOSIS — Z8249 Family history of ischemic heart disease and other diseases of the circulatory system: Secondary | ICD-10-CM | POA: Diagnosis not present

## 2018-07-22 DIAGNOSIS — D62 Acute posthemorrhagic anemia: Secondary | ICD-10-CM | POA: Diagnosis not present

## 2018-07-22 DIAGNOSIS — Z86010 Personal history of colonic polyps: Secondary | ICD-10-CM | POA: Diagnosis not present

## 2018-07-22 DIAGNOSIS — Q231 Congenital insufficiency of aortic valve: Secondary | ICD-10-CM | POA: Diagnosis not present

## 2018-07-22 DIAGNOSIS — I480 Paroxysmal atrial fibrillation: Secondary | ICD-10-CM | POA: Diagnosis not present

## 2018-07-22 DIAGNOSIS — M1990 Unspecified osteoarthritis, unspecified site: Secondary | ICD-10-CM | POA: Diagnosis present

## 2018-07-22 DIAGNOSIS — Y838 Other surgical procedures as the cause of abnormal reaction of the patient, or of later complication, without mention of misadventure at the time of the procedure: Secondary | ICD-10-CM | POA: Diagnosis present

## 2018-07-22 DIAGNOSIS — E876 Hypokalemia: Secondary | ICD-10-CM | POA: Diagnosis not present

## 2018-07-22 DIAGNOSIS — F419 Anxiety disorder, unspecified: Secondary | ICD-10-CM | POA: Diagnosis present

## 2018-07-22 DIAGNOSIS — Z79899 Other long term (current) drug therapy: Secondary | ICD-10-CM | POA: Diagnosis not present

## 2018-07-22 DIAGNOSIS — K91840 Postprocedural hemorrhage and hematoma of a digestive system organ or structure following a digestive system procedure: Secondary | ICD-10-CM | POA: Diagnosis not present

## 2018-07-22 DIAGNOSIS — D696 Thrombocytopenia, unspecified: Secondary | ICD-10-CM | POA: Diagnosis not present

## 2018-07-22 DIAGNOSIS — R55 Syncope and collapse: Secondary | ICD-10-CM | POA: Diagnosis not present

## 2018-07-22 DIAGNOSIS — Z888 Allergy status to other drugs, medicaments and biological substances status: Secondary | ICD-10-CM | POA: Diagnosis not present

## 2018-07-22 DIAGNOSIS — K760 Fatty (change of) liver, not elsewhere classified: Secondary | ICD-10-CM | POA: Diagnosis not present

## 2018-07-22 DIAGNOSIS — I5032 Chronic diastolic (congestive) heart failure: Secondary | ICD-10-CM | POA: Diagnosis not present

## 2018-07-22 DIAGNOSIS — Z800 Family history of malignant neoplasm of digestive organs: Secondary | ICD-10-CM | POA: Diagnosis not present

## 2018-07-22 DIAGNOSIS — I10 Essential (primary) hypertension: Secondary | ICD-10-CM | POA: Diagnosis not present

## 2018-07-22 DIAGNOSIS — Z7902 Long term (current) use of antithrombotics/antiplatelets: Secondary | ICD-10-CM | POA: Diagnosis not present

## 2018-07-22 DIAGNOSIS — Z7901 Long term (current) use of anticoagulants: Secondary | ICD-10-CM | POA: Diagnosis not present

## 2018-07-22 DIAGNOSIS — K921 Melena: Secondary | ICD-10-CM | POA: Diagnosis not present

## 2018-07-22 LAB — CBC
HCT: 31.2 % — ABNORMAL LOW (ref 39.0–52.0)
Hemoglobin: 10.2 g/dL — ABNORMAL LOW (ref 13.0–17.0)
MCH: 30.4 pg (ref 26.0–34.0)
MCHC: 32.7 g/dL (ref 30.0–36.0)
MCV: 92.9 fL (ref 80.0–100.0)
Platelets: 140 10*3/uL — ABNORMAL LOW (ref 150–400)
RBC: 3.36 MIL/uL — ABNORMAL LOW (ref 4.22–5.81)
RDW: 12.5 % (ref 11.5–15.5)
WBC: 3.8 10*3/uL — ABNORMAL LOW (ref 4.0–10.5)
nRBC: 0 % (ref 0.0–0.2)

## 2018-07-22 LAB — HEMOGLOBIN AND HEMATOCRIT, BLOOD
HCT: 30.8 % — ABNORMAL LOW (ref 39.0–52.0)
HCT: 30.5 % — ABNORMAL LOW (ref 39.0–52.0)
HCT: 28.5 % — ABNORMAL LOW (ref 39.0–52.0)
Hemoglobin: 10.2 g/dL — ABNORMAL LOW (ref 13.0–17.0)
Hemoglobin: 10 g/dL — ABNORMAL LOW (ref 13.0–17.0)
Hemoglobin: 9.6 g/dL — ABNORMAL LOW (ref 13.0–17.0)

## 2018-07-22 NOTE — Progress Notes (Signed)
Transfer Note:   Traveling Method: WC Transferring Unit: 5C Mental Orientation: A&OX4 Telemetry: Per MD orders Assessment: Completed Skin: Warm, dry and intact. IV: Clean, dry and intact. Pain: 0 Safety Measures: Safety Fall Prevention Plan has been given, discussed and signed Admission: Completed 75M Orientation: Patient has been orientated to the room, unit and staff.  Family: Wife at bedside  Orders have been reviewed and implemented. Will continue to monitor the patient. Call light has been placed within reach and bed alarm has been activated.   Aneta Mins BSN, RN

## 2018-07-22 NOTE — Progress Notes (Signed)
Pt transferred to [address], report given. Air conditioner not working in current room was why transferred.   Eleanora Neighbor, RN

## 2018-07-22 NOTE — Progress Notes (Signed)
[facility] PROGRESS NOTE  [name] [name] GBT:[id number] DOB: 22-Jul-1941 DOA: 07/21/2018 PCP: Cassandria Anger, MD  Assessment/Plan:  1.  Post polypectomy lower GI bleed:  Has had 3 BM's this am. First was dark then maroon and last more cherry in color. Hg drifting down slowly. Has tolerated clear liquids. Denies dizziness. Evaluated by gi who opine may need colonoscopy today. -serial cbc -bed rest per gi -VS every 4 hours x6 -diet per gi -monitor output  #2. Acute blood loss anemia related to #1. Hg down to 10.1 from 12 yesterday. Continues to slowly drift. 3 episodes hematochezia.  -see #1 -transfuse if HG 9 or less -monitor closely  2.  Paroxysmal atrial fibrillation: Anticoagulation on hold. PT/INR within limits of normal.   Patient had not resumed postprocedure.  He reports holding anticoagulation for a week prior to procedure as well.  -continue beta-blockers with holding parameters  3.  Hypertension:  Fair control  -continue beta-blockers with parameters - hold HCTZ  -monitor BP closely  Code Status: full Family Communication: none present Disposition Plan: home when ready   Consultants:  gastroenterology  Procedures:    Antibiotics:    HPI/Subjective: Feeling fine tolerating clear liquids. Has had 3 episodes hematochezia this am. No pain. VSS  Admitted 11/8 hematochezia s/p polypectomy. Of note chart review indicates 5cm sessile hepatic flexure polyp was resected at [facility] 5 days ago by dr Stephanie Acre.  Objective: Vitals:   07/22/18 0700 07/22/18 1029  BP: (!) 141/74 123/78  Pulse: 70 72  Resp: 20   Temp: 98.1 F (36.7 C)   SpO2: (!) 20%     Intake/Output Summary (Last 24 hours) at 07/22/2018 1100 Last data filed at 07/22/2018 0300 Gross per 24 hour  Intake 1086.24 ml  Output -  Net 1086.24 ml   There were no vitals filed for this visit.  Exam:   General:  Awake alert in no acute distress talkative  Cardiovascular: rrr no mgr no LE  edema  Respiratory: normal effort BS clear bilaterally no wheeze no rhonchi  Abdomen: non-distended soft +BS no guarding or rebounding  Musculoskeletal: joints without swelling/erythema   Data Reviewed: Basic Metabolic Panel: Recent Labs  Lab 07/21/18 0826  NA 140  K 3.8  CL 108  CO2 23  GLUCOSE 125*  BUN 17  CREATININE 0.99  CALCIUM 8.7*   Liver Function Tests: No results for input(s): AST, ALT, ALKPHOS, BILITOT, PROT, ALBUMIN in the last 168 hours. No results for input(s): LIPASE, AMYLASE in the last 168 hours. No results for input(s): AMMONIA in the last 168 hours. CBC: Recent Labs  Lab 07/21/18 0826 07/21/18 1130 07/21/18 1637 07/22/18 0607  WBC 5.4  --   --  3.8*  NEUTROABS 3.6  --   --   --   HGB 12.0* 11.2* 10.7* 10.2*  HCT 37.5* 34.8* 32.1* 31.2*  MCV 95.7  --   --  92.9  PLT 144*  --   --  140*   Cardiac Enzymes: No results for input(s): CKTOTAL, CKMB, CKMBINDEX, TROPONINI in the last 168 hours. BNP (last 3 results) Recent Labs    10/27/17 1452 02/28/18 1238  BNP 556.9* 644.5*    ProBNP (last 3 results) No results for input(s): PROBNP in the last 8760 hours.  CBG: No results for input(s): GLUCAP in the last 168 hours.  No results found for this or any previous visit (from the past 240 hour(s)).   Studies: No results found.  Scheduled Meds: . cholecalciferol  2,000 Units Oral Daily  . metoprolol succinate  25 mg Oral BID   Continuous Infusions: . lactated ringers 75 mL/hr at 07/22/18 0155    Principal Problem:   GI bleed Active Problems:   Acute blood loss anemia   Essential hypertension   PAF (paroxysmal atrial fibrillation) ([place])    Time spent: 40 minutes    Radene Gunning NP  [facility]  If 7PM-7AM, please contact night-coverage at [email or web address], password [facility] 07/22/2018, 11:00 AM  LOS: 0 days

## 2018-07-22 NOTE — Progress Notes (Signed)
Had moderate amount of "cherry red" bleeding with bm in commode at approx 0930- NP made aware.

## 2018-07-22 NOTE — Progress Notes (Signed)
Patient ID: [name] [name], male   DOB: 1941-07-06, 77 y.o.   MRN: [id number]    Progress Note   Subjective    Feels a little weak , otherwise fine  HGb still drifting - 10.2  This am early  Has had 3 BM's today - he says dark  With red tinge    Objective   Vital signs in last 24 hours: Temp:  [97.7 F (36.5 C)-98.9 F (37.2 C)] 98.1 F (36.7 C) (11/09 0700) Pulse Rate:  [62-70] 70 (11/09 0700) Resp:  [16-20] 20 (11/09 0700) BP: (113-141)/(62-87) 141/74 (11/09 0700) SpO2:  [20 %-100 %] 20 % (11/09 0700) Last BM Date: 07/21/18 General:     Elderly WM in NAD, talkative  Heart:  Regular rate and rhythm; no murmurs Lungs: Respirations even and unlabored, lungs CTA bilaterally Abdomen:  Soft, nontender and nondistended. Normal bowel sounds. Extremities:  Without edema. Neurologic:  Alert and oriented,  grossly normal neurologically. Psych:  Cooperative. Normal mood and affect.  Intake/Output from previous day: 11/08 0701 - 11/09 0700 In: 1086.2 [I.V.:1086.2] Out: -  Intake/Output this shift: No intake/output data recorded.  Lab Results: Recent Labs    07/21/18 0826 07/21/18 1130 07/21/18 1637 07/22/18 0607  WBC 5.4  --   --  3.8*  HGB 12.0* 11.2* 10.7* 10.2*  HCT 37.5* 34.8* 32.1* 31.2*  PLT 144*  --   --  140*   BMET Recent Labs    07/21/18 0826  NA 140  K 3.8  CL 108  CO2 23  GLUCOSE 125*  BUN 17  CREATININE 0.99  CALCIUM 8.7*   LFT No results for input(s): PROT, ALBUMIN, AST, ALT, ALKPHOS, BILITOT, BILIDIR, IBILI in the last 72 hours. PT/INR Recent Labs    07/21/18 1637  LABPROT 14.6  INR 1.15       Assessment / Plan:    #1 77 yo WM admitted yesterday with post polypectomy bleed after EMR at [facility] on 11/4  With removal of a 5 cm tubular  adenoma - not clipped , also 2 other small polyps removed .  Path pending  He has been stable , but hgb has dropped 2 gms since admit - no transfusion requirement as yet.  Suspect he is still oozing   #2  chronic anticoagulation - Eliquis for  Atrial fib - has been off since 10/24 - will plan 2 leave off x 2 weeks since  he has bled and has a large polyp  site defect    Plan - stat hgb now - if further drop will prep for Colon  ? Late this afternoon  Bedrest today   hgbs q 6 -transfuse if less than 9       Contact  Amy Esterwood, P.A.-C               [phone]      Active Problems:   GI bleed     LOS: 0 days   Amy Esterwood  07/22/2018, 9:49 AM

## 2018-07-23 DIAGNOSIS — E876 Hypokalemia: Secondary | ICD-10-CM

## 2018-07-23 LAB — BASIC METABOLIC PANEL
Anion gap: 5 (ref 5–15)
BUN: 7 mg/dL — ABNORMAL LOW (ref 8–23)
CO2: 26 mmol/L (ref 22–32)
Calcium: 8.5 mg/dL — ABNORMAL LOW (ref 8.9–10.3)
Chloride: 110 mmol/L (ref 98–111)
Creatinine, Ser: 0.99 mg/dL (ref 0.61–1.24)
GFR calc Af Amer: >60 mL/min (ref >60)
GFR calc non Af Amer: >60 mL/min (ref >60)
Glucose, Bld: 101 mg/dL — ABNORMAL HIGH (ref 70–99)
Potassium: 3.1 mmol/L — ABNORMAL LOW (ref 3.5–5.1)
Sodium: 141 mmol/L (ref 135–145)

## 2018-07-23 LAB — HEMOGLOBIN AND HEMATOCRIT, BLOOD
HCT: 28.2 % — ABNORMAL LOW (ref 39.0–52.0)
HCT: 31.1 % — ABNORMAL LOW (ref 39.0–52.0)
HCT: 29.5 % — ABNORMAL LOW (ref 39.0–52.0)
Hemoglobin: 9.4 g/dL — ABNORMAL LOW (ref 13.0–17.0)
Hemoglobin: 10.6 g/dL — ABNORMAL LOW (ref 13.0–17.0)
Hemoglobin: 9.6 g/dL — ABNORMAL LOW (ref 13.0–17.0)

## 2018-07-23 LAB — GLUCOSE, CAPILLARY: Glucose-Capillary: 128 mg/dL — ABNORMAL HIGH (ref 70–99)

## 2018-07-23 MED ORDER — METOPROLOL SUCCINATE ER 25 MG PO TB24
25.0000 mg | ORAL_TABLET | Freq: Two times a day (BID) | ORAL | Status: DC
  Administered 2018-07-24: 25 mg via ORAL
  Filled 2018-07-23: qty 1

## 2018-07-23 MED ORDER — SODIUM CHLORIDE 0.9 % IV SOLN
INTRAVENOUS | Status: AC
  Administered 2018-07-23: 21:00:00 via INTRAVENOUS

## 2018-07-23 MED ORDER — POTASSIUM CHLORIDE CRYS ER 20 MEQ PO TBCR
40.0000 meq | EXTENDED_RELEASE_TABLET | ORAL | Status: AC
  Administered 2018-07-23 (×2): 40 meq via ORAL
  Filled 2018-07-23 (×2): qty 2

## 2018-07-23 NOTE — Progress Notes (Signed)
MD placed order for h/h Lab technician at bedside now

## 2018-07-23 NOTE — Progress Notes (Signed)
Patient ID: [name] [name], male   DOB: 04/30/1941, 77 y.o.   MRN: [id number]  PROGRESS NOTE    [name] [name]  JJO:[id number] DOB: 06-24-1941 DOA: 07/21/2018 PCP: Cassandria Anger, MD   Brief Narrative:  77 year old male with history of hypertension, hypercholesterolemia, paroxysmal atrial fibrillation on Eliquis who recently underwent colonoscopy and polypectomy at [facility] on November 4 presented with bright red blood red per rectum and drop in hemoglobin.  GI has been consulted.   Assessment & Plan:   Principal Problem:   GI bleed Active Problems:   Essential hypertension   PAF (paroxysmal atrial fibrillation) (HCC)   Acute blood loss anemia   Post polypectomy lower GI bleeding -Patient states that he still sees some blood on wiping.  Hemoglobin is 10.6 this morning.  Will discontinue IV fluids.  Follow further recommendations from GI.  Hemodynamically stable  Acute blood loss anemia -Probably secondary to above.  Hemoglobin stable for now.  Monitor  Paroxysmal atrial fibrillation -Rate controlled.  Eliquis on hold.  Continue metoprolol  Hypokalemia -Place.  Repeat a.m. labs  Hypertension -Stable blood pressure.  Continue metoprolol   DVT prophylaxis: SCDs Code Status: Full Family Communication: None at bedside Disposition Plan: Home once cleared with GI  Consultants: GI  Procedures: None  Antimicrobials: None   Subjective: Patient seen and examined at bedside.  He denies any overnight fever, nausea, vomiting or abdominal pain.  He sees some blood on wiping.  Objective: Vitals:   07/22/18 2303 07/22/18 2309 07/23/18 0602 07/23/18 0809  BP: (!) 148/82  133/76 109/72  Pulse: 70  69 67  Resp:   18 20  Temp: 98.3 F (36.8 C)  97.7 F (36.5 C) 98 F (36.7 C)  TempSrc: Oral  Oral Oral  SpO2: 98%  100% 98%  Weight:  85.1 kg    Height:  5\' 10"  (1.778 m)      Intake/Output Summary (Last 24 hours) at 07/23/2018 1008 Last data filed at 07/23/2018 0954 Gross  per 24 hour  Intake 2293.5 ml  Output 850 ml  Net 1443.5 ml   Filed Weights   07/22/18 2309  Weight: 85.1 kg    Examination:  General exam: Appears calm and comfortable  Respiratory system: Bilateral decreased breath sounds at bases Cardiovascular system: S1 & S2 heard, Rate controlled Gastrointestinal system: Abdomen is nondistended, soft and nontender. Normal bowel sounds heard. Extremities: No cyanosis, clubbing, edema       Data Reviewed: I have personally reviewed following labs and imaging studies  CBC: Recent Labs  Lab 07/21/18 0826  07/22/18 0607 07/22/18 1028 07/22/18 1543 07/22/18 2202 07/23/18 0536 07/23/18 0909  WBC 5.4  --  3.8*  --   --   --   --   --   NEUTROABS 3.6  --   --   --   --   --   --   --   HGB 12.0*   < > 10.2* 10.2* 10.0* 9.6* 9.4* 10.6*  HCT 37.5*   < > 31.2* 30.8* 30.5* 28.5* 28.2* 31.1*  MCV 95.7  --  92.9  --   --   --   --   --   PLT 144*  --  140*  --   --   --   --   --    < > = values in this interval not displayed.   Basic Metabolic Panel: Recent Labs  Lab 07/21/18 0826 07/23/18 0536  NA 140 141  K 3.8 3.1*  CL 108 110  CO2 23 26  GLUCOSE 125* 101*  BUN 17 7*  CREATININE 0.99 0.99  CALCIUM 8.7* 8.5*   GFR: Estimated Creatinine Clearance: 63.5 mL/min (by C-G formula based on SCr of 0.99 mg/dL). Liver Function Tests: No results for input(s): AST, ALT, ALKPHOS, BILITOT, PROT, ALBUMIN in the last 168 hours. No results for input(s): LIPASE, AMYLASE in the last 168 hours. No results for input(s): AMMONIA in the last 168 hours. Coagulation Profile: Recent Labs  Lab 07/21/18 1637  INR 1.15   Cardiac Enzymes: No results for input(s): CKTOTAL, CKMB, CKMBINDEX, TROPONINI in the last 168 hours. BNP (last 3 results) No results for input(s): PROBNP in the last 8760 hours. HbA1C: No results for input(s): HGBA1C in the last 72 hours. CBG: No results for input(s): GLUCAP in the last 168 hours. Lipid Profile: No results  for input(s): CHOL, HDL, LDLCALC, TRIG, CHOLHDL, LDLDIRECT in the last 72 hours. Thyroid Function Tests: No results for input(s): TSH, T4TOTAL, FREET4, T3FREE, THYROIDAB in the last 72 hours. Anemia Panel: No results for input(s): VITAMINB12, FOLATE, FERRITIN, TIBC, IRON, RETICCTPCT in the last 72 hours. Sepsis Labs: No results for input(s): PROCALCITON, LATICACIDVEN in the last 168 hours.  No results found for this or any previous visit (from the past 240 hour(s)).       Radiology Studies: No results found.      Scheduled Meds: . cholecalciferol  2,000 Units Oral Daily  . metoprolol succinate  25 mg Oral BID  . potassium chloride  40 mEq Oral Q4H   Continuous Infusions: . lactated ringers 75 mL/hr at 07/22/18 1436     LOS: 1 day        Aline August, MD [facility] Pager [phone]  If 7PM-7AM, please contact night-coverage [email or web address] Password [email or web address] 07/23/2018, 10:08 AM

## 2018-07-23 NOTE — Progress Notes (Signed)
Pt called RN to room  Pt had medium-large bloody BM  Pt anxious stated this is the worst it has been  Paged MD to inform  Awaiting call back

## 2018-07-23 NOTE — Progress Notes (Signed)
PA Esterwood (GI) aware of stool sample

## 2018-07-23 NOTE — Progress Notes (Signed)
MD aware of BM, stated to save sample for GI MD  Pt aware

## 2018-07-23 NOTE — Progress Notes (Signed)
Pt on walk back from 2nd bloody BM, pt leaned his body weight on RN and was not responding to verbal commands. RN called for assistance into the room. Pt cold and sweaty. Moved pt to the bed. CBG, vital signs collected and pt placed on telemetry. Code canceled when pt responding to voice. RRT started 18 guage for normal saline. Placed wash cloth on pt face. CN called MD, MD came to bedside to assess. Called pt wife. CN provided update to pt wife at bedside. MD to place orders, night shift RN aware. Will continue to monitor

## 2018-07-23 NOTE — Progress Notes (Signed)
Update provided to pt and pt family at bedside  

## 2018-07-23 NOTE — Progress Notes (Signed)
Patient ID: [name] [name], male   DOB: 1940/12/17, 77 y.o.   MRN: [id number]     Progress Note   Subjective   Feels ok , up in chair , walking in room  No Bm's since yesterday am - this am passed a dark clot - no stool or blood - just a big clot which nurse collected . HGB had drifted yesterday but stable today and up to 10.6 now   Objective   Vital signs in last 24 hours: Temp:  [97.7 F (36.5 C)-98.3 F (36.8 C)] 98 F (36.7 C) (11/10 0809) Pulse Rate:  [64-70] 67 (11/10 0809) Resp:  [18-20] 20 (11/10 0809) BP: (109-148)/(68-82) 109/72 (11/10 0809) SpO2:  [98 %-100 %] 98 % (11/10 0809) Weight:  [85.1 kg] 85.1 kg (11/09 2309) Last BM Date: 07/22/18 General:   Elderly WM in NAD- very pleasant and chatty Heart:  Regular rate and rhythm; no murmurs Lungs: Respirations even and unlabored, lungs CTA bilaterally Abdomen:  Soft, nontender and nondistended. Normal bowel sounds. Extremities:  Without edema. Neurologic:  Alert and oriented,  grossly normal neurologically. Psych:  Cooperative. Normal mood and affect.  Intake/Output from previous day: 11/09 0701 - 11/10 0700 In: 1164.6 [I.V.:1164.6] Out: 550 [Urine:550] Intake/Output this shift: Total I/O In: 1129 [I.V.:1129] Out: 300 [Urine:300]  Lab Results: Recent Labs    07/21/18 0826  07/22/18 0607  07/22/18 2202 07/23/18 0536 07/23/18 0909  WBC 5.4  --  3.8*  --   --   --   --   HGB 12.0*   < > 10.2*   < > 9.6* 9.4* 10.6*  HCT 37.5*   < > 31.2*   < > 28.5* 28.2* 31.1*  PLT 144*  --  140*  --   --   --   --    < > = values in this interval not displayed.   BMET Recent Labs    07/21/18 0826 07/23/18 0536  NA 140 141  K 3.8 3.1*  CL 108 110  CO2 23 26  GLUCOSE 125* 101*  BUN 17 7*  CREATININE 0.99 0.99  CALCIUM 8.7* 8.5*   LFT No results for input(s): PROT, ALBUMIN, AST, ALT, ALKPHOS, BILITOT, BILIDIR, IBILI in the last 72 hours. PT/INR Recent Labs    07/21/18 1637  LABPROT 14.6  INR 1.15     Studies/Results: No results found.     Assessment / Plan:    #1 77 yo WM with post polypectomy bleed after EMR  At [facility]/ Dr [name] [name] a large 5 cm adenomatous polyp at hepatic flexure on 11/4  Lesion was cauterized , not clipped  Admitted 11/8 after several grossly bloody stool and syncope .  He has been stable since admit -HGB 12 on admit -down to 9.4 yesterday , no transfusion requirement - up to 10.6  today .  #2 hx atrial fib - on chronic plavix - he has been off since  Prior to Colon   Plan; Continue to observe one more day  Advance to full liquids  Would leave off Plavix  For 2 weeks at discharge  We will continue to follow      Contact  [name] [name] [place], P.A.-C               [phone]      Principal Problem:   GI bleed Active Problems:   Essential hypertension   PAF (paroxysmal atrial fibrillation) (H[facility])   Acute blood loss anemia     LOS: 1  day   [name] [name]  07/23/2018, 10:48 AM

## 2018-07-23 NOTE — Significant Event (Signed)
Rapid Response Event Note Code Blue Called  Overview:  on arrival pt lying supine in bed, skin cool, pale and clammy, alert and oriented x4. Pt had a syncopal episode while returning from restroom after having a large bloody BM. Code blue cancelled. New 18 g PIV and 1 L NS bolus started. BP 113-53 (68), HR 58, 100% RA H&H collected  Dr. Myna Hidalgo to bedside       Interventions: 1 L NS bolus  18G PIV Plan of Care (if not transferred): Wait for H&H results, continue to monitor. [place] and Christin RN aware Event Summary:   at      at          [facility], [name] [name]

## 2018-07-24 DIAGNOSIS — D62 Acute posthemorrhagic anemia: Secondary | ICD-10-CM

## 2018-07-24 DIAGNOSIS — I480 Paroxysmal atrial fibrillation: Secondary | ICD-10-CM

## 2018-07-24 LAB — CBC WITH DIFFERENTIAL/PLATELET
Abs Immature Granulocytes: 0.02 10*3/uL (ref 0.00–0.07)
Basophils Absolute: 0 10*3/uL (ref 0.0–0.1)
Basophils Relative: 0 %
Eosinophils Absolute: 0.1 10*3/uL (ref 0.0–0.5)
Eosinophils Relative: 2 %
HCT: 23.5 % — ABNORMAL LOW (ref 39.0–52.0)
Hemoglobin: 7.7 g/dL — ABNORMAL LOW (ref 13.0–17.0)
Immature Granulocytes: 1 %
Lymphocytes Relative: 33 %
Lymphs Abs: 1.1 10*3/uL (ref 0.7–4.0)
MCH: 30.4 pg (ref 26.0–34.0)
MCHC: 32.8 g/dL (ref 30.0–36.0)
MCV: 92.9 fL (ref 80.0–100.0)
Monocytes Absolute: 0.3 10*3/uL (ref 0.1–1.0)
Monocytes Relative: 9 %
Neutro Abs: 1.8 10*3/uL (ref 1.7–7.7)
Neutrophils Relative %: 55 %
Platelets: 136 10*3/uL — ABNORMAL LOW (ref 150–400)
RBC: 2.53 MIL/uL — ABNORMAL LOW (ref 4.22–5.81)
RDW: 12.5 % (ref 11.5–15.5)
WBC: 3.3 10*3/uL — ABNORMAL LOW (ref 4.0–10.5)
nRBC: 0 % (ref 0.0–0.2)

## 2018-07-24 LAB — BASIC METABOLIC PANEL
Anion gap: 4 — ABNORMAL LOW (ref 5–15)
BUN: 8 mg/dL (ref 8–23)
CO2: 25 mmol/L (ref 22–32)
Calcium: 8.1 mg/dL — ABNORMAL LOW (ref 8.9–10.3)
Chloride: 112 mmol/L — ABNORMAL HIGH (ref 98–111)
Creatinine, Ser: 0.96 mg/dL (ref 0.61–1.24)
GFR calc Af Amer: >60 mL/min (ref >60)
GFR calc non Af Amer: >60 mL/min (ref >60)
Glucose, Bld: 97 mg/dL (ref 70–99)
Potassium: 3.4 mmol/L — ABNORMAL LOW (ref 3.5–5.1)
Sodium: 141 mmol/L (ref 135–145)

## 2018-07-24 LAB — HEMOGLOBIN AND HEMATOCRIT, BLOOD
HCT: 26.4 % — ABNORMAL LOW (ref 39.0–52.0)
Hemoglobin: 8.6 g/dL — ABNORMAL LOW (ref 13.0–17.0)

## 2018-07-24 LAB — PREPARE RBC (CROSSMATCH)

## 2018-07-24 LAB — MAGNESIUM: Magnesium: 1.8 mg/dL (ref 1.7–2.4)

## 2018-07-24 MED ORDER — SODIUM CHLORIDE 0.9% IV SOLUTION
Freq: Once | INTRAVENOUS | Status: AC
  Administered 2018-07-24: 12:00:00 via INTRAVENOUS

## 2018-07-24 NOTE — Progress Notes (Addendum)
One unit of blood administered without complications. VS Stable. Post transfusion H&H order placed per order.

## 2018-07-24 NOTE — Progress Notes (Addendum)
Daily Rounding Note  07/24/2018, 10:12 AM  LOS: 2 days   SUBJECTIVE:   Chief complaint: Post polypectomy bleed.    Patient had a large episode of hematochezia last night associated with syncope.  Rapid response was called. This AM ~ 9 AM, had another episode, no syncope or weakness.   Hgb went from 10.6 >> 9.6 (at ~ 7 PM) >> 7.7 (0730 this AM).   Remains on clear liquid diet.  OBJECTIVE:         Vital signs in last 24 hours:    Temp:  [98.1 F (36.7 C)-98.4 F (36.9 C)] 98.4 F (36.9 C) (11/11 0425) Pulse Rate:  [55-74] 74 (11/11 0837) Resp:  [14-20] 16 (11/11 0425) BP: (99-132)/(52-86) 109/63 (11/11 0837) SpO2:  [96 %-100 %] 98 % (11/11 0425) Weight:  [89.5 kg] 89.5 kg (11/11 0425) Last BM Date: 07/24/18 Filed Weights   07/22/18 2309 07/24/18 0425  Weight: 85.1 kg 89.5 kg   General: looks well   Heart: RRR, slight syst murmer  Chest: clear bil.  No cough or dyspnea  Abdomen: soft, NT, ND.  Active BS  Extremities: no CCE Neuro/Psych:  Fully oriented x 3.  No deficits, tremors.  Fluid speech.    Intake/Output from previous day: 11/10 0701 - 11/11 0700 In: 3315.4 [P.O.:1260; I.V.:2055.4] Out: 950 [Urine:950]  Intake/Output this shift: Total I/O In: 120 [P.O.:120] Out: 0   Lab Results: Recent Labs    07/22/18 0607  07/23/18 0909 07/23/18 1853 07/24/18 0733  WBC 3.8*  --   --   --  3.3*  HGB 10.2*   < > 10.6* 9.6* 7.7*  HCT 31.2*   < > 31.1* 29.5* 23.5*  PLT 140*  --   --   --  136*   < > = values in this interval not displayed.   BMET Recent Labs    07/23/18 0536 07/24/18 0733  NA 141 141  K 3.1* 3.4*  CL 110 112*  CO2 26 25  GLUCOSE 101* 97  BUN 7* 8  CREATININE 0.99 0.96  CALCIUM 8.5* 8.1*   LFT No results for input(s): PROT, ALBUMIN, AST, ALT, ALKPHOS, BILITOT, BILIDIR, IBILI in the last 72 hours. PT/INR Recent Labs    07/21/18 1637  LABPROT 14.6  INR 1.15   Hepatitis  Panel No results for input(s): HEPBSAG, HCVAB, HEPAIGM, HEPBIGM in the last 72 hours.  Studies/Results: No results found.  ASSESMENT:   *  Post polypectomy bleed (EMR of extensive adenomatous hep flex polyp at [facility], 11/4)  *   Chronic Eliquisfor Afib, on hold pre and post EMR.   Will need off Eliquis for 2 additional weeks at discharge  *    Hypokalemia.  Improved, not corrected as of this a.m.'s labs.  *    Mild, noncritical, thrombocytopenia.   PLAN   *   Will need to stay in the hospital at least 1 more night.    *  Pt requesting transfer to [place], notified Dr Starla Link of pt's wishes, he will make contact.  Transfer line shared with him.     *   CBC at noon.      Azucena Freed  07/24/2018, 10:12 AM Phone [phone]   Attending physician's note   I have taken an interval history, reviewed the chart and examined the patient. I agree with the Advanced Practitioner's note, impression and recommendations.   77 year old male with H/O  atrial fibrillation on anticoagulation (on hold) who recently underwent colonoscopy with EMR of large hepatic flexure polyp at [facility] (Dr Janeice Robinson) on 07/17/2018, with post polypectomy bleeding.  Had syncopal episode last night. Hb dropped from 10.6 to 7.7.  Being transfused. Plan: Trend CBC, I have offered him rpt colonoscopy if he continues to bleed/IR with embolization if it fails.  He wanted to get any further procedures at [facility].  I have discussed with Dr Marygrace Drought PA and Dr Janeice Robinson is aware. Plan: CBC, conservative management here for now.   Carmell Austria, MD

## 2018-07-24 NOTE — Progress Notes (Signed)
Patient ID: [name] [name], male   DOB: June 03, 1941, 77 y.o.   MRN: [id number]  PROGRESS NOTE    [name] [name]  WUJ:[id number] DOB: 1941/05/27 DOA: 07/21/2018 PCP: Cassandria Anger, MD   Brief Narrative:  77 year old male with history of hypertension, hypercholesterolemia, paroxysmal atrial fibrillation on Eliquis who recently underwent colonoscopy and polypectomy at [facility] on November 4 presented with bright red blood red per rectum and drop in hemoglobin.  GI has been consulted.   Assessment & Plan:   Principal Problem:   GI bleed Active Problems:   Essential hypertension   PAF (paroxysmal atrial fibrillation) (HCC)   Acute blood loss anemia   Post polypectomy lower GI bleeding -Patient had an episode of rectal bleeding and syncopal episode last night.  He had another bloody bowel movement this morning.  Hemoglobin is 7.7 this morning.  His bloody bowel movement was after blood draw this morning.  Will transfuse 1 unit of packed red cells today.  GI following.  Will follow further recommendations.  Syncope -Probably from vasovagal episode from GI bleeding.  Monitor.  Will hold metoprolol for now.  Acute blood loss anemia -Probably secondary to above.  Monitor.  Plan as above  Paroxysmal atrial fibrillation -Rate controlled.  Eliquis on hold.  Hold metoprolol  Hypokalemia -Replace.  Repeat a.m. labs  Hypertension -Stable blood pressure.  Hold metoprolol because of continued GI bleeding for now.   DVT prophylaxis: SCDs Code Status: Full Family Communication: None at bedside Disposition Plan: Home once cleared by GI  Consultants: GI  Procedures: None  Antimicrobials: None   Subjective: Patient seen and examined at bedside.  Patient is concerned about his continued rectal bleeding.  He had rectal bleeding last night and also had a syncopal episode at the same time and had another episode of rectal bleeding this morning.  No abdominal pain.  No overnight fever or  vomiting.  Objective: Vitals:   07/24/18 0049 07/24/18 0218 07/24/18 0425 07/24/18 0837  BP: 106/68  109/65 109/63  Pulse: 72 70 69 74  Resp: 16  16   Temp: 98.2 F (36.8 C)  98.4 F (36.9 C)   TempSrc: Oral  Oral   SpO2: 98%  98%   Weight:   89.5 kg   Height:        Intake/Output Summary (Last 24 hours) at 07/24/2018 1056 Last data filed at 07/24/2018 0840 Gross per 24 hour  Intake 1886.45 ml  Output 650 ml  Net 1236.45 ml   Filed Weights   07/22/18 2309 07/24/18 0425  Weight: 85.1 kg 89.5 kg    Examination:  General exam: Appears calm and comfortable, no distress Respiratory system: Bilateral decreased breath sounds at bases, no wheezing Cardiovascular system: S1 & S2 heard, Rate controlled Gastrointestinal system: Abdomen is nondistended, soft and nontender. Normal bowel sounds heard. Extremities: No cyanosis,edema       Data Reviewed: I have personally reviewed following labs and imaging studies  CBC: Recent Labs  Lab 07/21/18 0826  07/22/18 0607  07/22/18 2202 07/23/18 0536 07/23/18 0909 07/23/18 1853 07/24/18 0733  WBC 5.4  --  3.8*  --   --   --   --   --  3.3*  NEUTROABS 3.6  --   --   --   --   --   --   --  1.8  HGB 12.0*   < > 10.2*   < > 9.6* 9.4* 10.6* 9.6* 7.7*  HCT 37.5*   < > 31.2*   < >  28.5* 28.2* 31.1* 29.5* 23.5*  MCV 95.7  --  92.9  --   --   --   --   --  92.9  PLT 144*  --  140*  --   --   --   --   --  136*   < > = values in this interval not displayed.   Basic Metabolic Panel: Recent Labs  Lab 07/21/18 0826 07/23/18 0536 07/24/18 0733  NA 140 141 141  K 3.8 3.1* 3.4*  CL 108 110 112*  CO2 23 26 25   GLUCOSE 125* 101* 97  BUN 17 7* 8  CREATININE 0.99 0.99 0.96  CALCIUM 8.7* 8.5* 8.1*  MG  --   --  1.8   GFR: Estimated Creatinine Clearance: 71.4 mL/min (by C-G formula based on SCr of 0.96 mg/dL). Liver Function Tests: No results for input(s): AST, ALT, ALKPHOS, BILITOT, PROT, ALBUMIN in the last 168 hours. No  results for input(s): LIPASE, AMYLASE in the last 168 hours. No results for input(s): AMMONIA in the last 168 hours. Coagulation Profile: Recent Labs  Lab 07/21/18 1637  INR 1.15   Cardiac Enzymes: No results for input(s): CKTOTAL, CKMB, CKMBINDEX, TROPONINI in the last 168 hours. BNP (last 3 results) No results for input(s): PROBNP in the last 8760 hours. HbA1C: No results for input(s): HGBA1C in the last 72 hours. CBG: Recent Labs  Lab 07/23/18 1859  GLUCAP 128*   Lipid Profile: No results for input(s): CHOL, HDL, LDLCALC, TRIG, CHOLHDL, LDLDIRECT in the last 72 hours. Thyroid Function Tests: No results for input(s): TSH, T4TOTAL, FREET4, T3FREE, THYROIDAB in the last 72 hours. Anemia Panel: No results for input(s): VITAMINB12, FOLATE, FERRITIN, TIBC, IRON, RETICCTPCT in the last 72 hours. Sepsis Labs: No results for input(s): PROCALCITON, LATICACIDVEN in the last 168 hours.  No results found for this or any previous visit (from the past 240 hour(s)).       Radiology Studies: No results found.      Scheduled Meds: . cholecalciferol  2,000 Units Oral Daily  . metoprolol succinate  25 mg Oral BID   Continuous Infusions:    LOS: 2 days        Aline August, MD [facility] Pager [phone]  If 7PM-7AM, please contact night-coverage [email or web address] Password [email or web address] 07/24/2018, 10:56 AM

## 2018-07-24 NOTE — Progress Notes (Signed)
Patient with another bloody stool this morning.MD Starla Link informed, paged GI Azucena Freed as well. Pt asymptomatic.VS stable.   Tessi Eustache, RN

## 2018-07-25 LAB — CBC WITH DIFFERENTIAL/PLATELET
Abs Immature Granulocytes: 0.02 10*3/uL (ref 0.00–0.07)
Basophils Absolute: 0 10*3/uL (ref 0.0–0.1)
Basophils Relative: 0 %
Eosinophils Absolute: 0.1 10*3/uL (ref 0.0–0.5)
Eosinophils Relative: 4 %
HCT: 26.7 % — ABNORMAL LOW (ref 39.0–52.0)
Hemoglobin: 8.8 g/dL — ABNORMAL LOW (ref 13.0–17.0)
Immature Granulocytes: 1 %
Lymphocytes Relative: 35 %
Lymphs Abs: 1.3 10*3/uL (ref 0.7–4.0)
MCH: 30 pg (ref 26.0–34.0)
MCHC: 33 g/dL (ref 30.0–36.0)
MCV: 91.1 fL (ref 80.0–100.0)
Monocytes Absolute: 0.4 10*3/uL (ref 0.1–1.0)
Monocytes Relative: 11 %
Neutro Abs: 1.8 10*3/uL (ref 1.7–7.7)
Neutrophils Relative %: 49 %
Platelets: 167 10*3/uL (ref 150–400)
RBC: 2.93 MIL/uL — ABNORMAL LOW (ref 4.22–5.81)
RDW: 13.5 % (ref 11.5–15.5)
WBC: 3.7 10*3/uL — ABNORMAL LOW (ref 4.0–10.5)
nRBC: 0 % (ref 0.0–0.2)

## 2018-07-25 LAB — BPAM RBC
Blood Product Expiration Date: 201912052359
ISSUE DATE / TIME: 201911111145
Unit Type and Rh: 600

## 2018-07-25 LAB — BASIC METABOLIC PANEL
Anion gap: 5 (ref 5–15)
BUN: 5 mg/dL — ABNORMAL LOW (ref 8–23)
CO2: 26 mmol/L (ref 22–32)
Calcium: 8.3 mg/dL — ABNORMAL LOW (ref 8.9–10.3)
Chloride: 110 mmol/L (ref 98–111)
Creatinine, Ser: 0.99 mg/dL (ref 0.61–1.24)
GFR calc Af Amer: >60 mL/min (ref >60)
GFR calc non Af Amer: >60 mL/min (ref >60)
Glucose, Bld: 92 mg/dL (ref 70–99)
Potassium: 3.4 mmol/L — ABNORMAL LOW (ref 3.5–5.1)
Sodium: 141 mmol/L (ref 135–145)

## 2018-07-25 LAB — TYPE AND SCREEN
ABO/RH(D): AB NEG
Antibody Screen: NEGATIVE
Unit division: 0

## 2018-07-25 LAB — MAGNESIUM: Magnesium: 1.7 mg/dL (ref 1.7–2.4)

## 2018-07-25 MED ORDER — POTASSIUM CHLORIDE CRYS ER 20 MEQ PO TBCR
40.0000 meq | EXTENDED_RELEASE_TABLET | Freq: Once | ORAL | Status: AC
  Administered 2018-07-25: 40 meq via ORAL
  Filled 2018-07-25: qty 2

## 2018-07-25 NOTE — Progress Notes (Signed)
Patient ID: [name] [name], male   DOB: 07-Nov-1940, 77 y.o.   MRN: [id number]  PROGRESS NOTE    [name] [name]  [id number]:[id number] DOB: [birth year] DOA: 07/21/2018 PCP: Cassandria Anger, MD   Brief Narrative:  77 year old male with history of hypertension, hypercholesterolemia, paroxysmal atrial fibrillation on Eliquis who recently underwent colonoscopy and polypectomy at [facility] on November 4 presented with bright red blood red per rectum and drop in hemoglobin.  GI  consulted.   Assessment & Plan:   Principal Problem:   GI bleed Active Problems:   Essential hypertension   PAF (paroxysmal atrial fibrillation) (HCC)   Acute blood loss anemia   Post polypectomy lower GI bleeding -No further episode of rectal bleeding since yesterday morning.  Status post 1 unit packed red cells transfusion on 07/24/2018.  Hemoglobin 8.8 this morning.  -GI following.  Diet being advanced by GI.  Continue conservative management. -Monitor overnight with repeat CBC in a.m. and possible DC home tomorrow if no further bleeding episodes  Syncope -Probably from vasovagal episode from GI bleeding which happened on 07/23/2018.  Monitor.  Continue to hold metoprolol for now.  Acute blood loss anemia -Probably secondary to above.  Monitor.  Plan as above  Paroxysmal atrial fibrillation -Rate controlled.  Eliquis on hold.  Hold metoprolol  Hypokalemia -Replace.  Repeat a.m. labs  Hypertension -Stable blood pressure.  Metoprolol held.  DVT prophylaxis: SCDs Code Status: Full Family Communication: None at bedside Disposition Plan: Home once cleared by GI  Consultants: GI  Procedures: None  Antimicrobials: None   Subjective: Patient seen and examined at bedside.  Patient denies any more rectal bleeding since yesterday morning.  Denies any abdominal pain, fever or vomiting.  No worsening shortness of breath. Objective: Vitals:   07/24/18 1740 07/24/18 2000 07/25/18 0031 07/25/18 0505  BP: 119/67 122/68  107/69 112/73  Pulse: 69 70 68 72  Resp: 18 18 18 18   Temp: 98.5 F (36.9 C) 98.2 F (36.8 C) 98.4 F (36.9 C) 98.1 F (36.7 C)  TempSrc: Oral Oral Oral Oral  SpO2: 100% 98% 98% 99%  Weight:    84.6 kg  Height:        Intake/Output Summary (Last 24 hours) at 07/25/2018 1125 Last data filed at 07/25/2018 1100 Gross per 24 hour  Intake 845 ml  Output 1201 ml  Net -356 ml   Filed Weights   07/22/18 2309 07/24/18 0425 07/25/18 0505  Weight: 85.1 kg 89.5 kg 84.6 kg    Examination:  General exam: No distress. Respiratory system: Bilateral decreased breath sounds at bases Cardiovascular system: Rate controlled, S1-S2 heard Gastrointestinal system: Abdomen is nondistended, soft and nontender. Normal bowel sounds heard. Extremities: No cyanosis,edema       Data Reviewed: I have personally reviewed following labs and imaging studies  CBC: Recent Labs  Lab 07/21/18 0826  07/22/18 0607  07/23/18 0909 07/23/18 1853 07/24/18 0733 07/24/18 1708 07/25/18 0453  WBC 5.4  --  3.8*  --   --   --  3.3*  --  3.7*  NEUTROABS 3.6  --   --   --   --   --  1.8  --  1.8  HGB 12.0*   < > 10.2*   < > 10.6* 9.6* 7.7* 8.6* 8.8*  HCT 37.5*   < > 31.2*   < > 31.1* 29.5* 23.5* 26.4* 26.7*  MCV 95.7  --  92.9  --   --   --  92.9  --  91.1  PLT 144*  --  140*  --   --   --  136*  --  167   < > = values in this interval not displayed.   Basic Metabolic Panel: Recent Labs  Lab 07/21/18 0826 07/23/18 0536 07/24/18 0733 07/25/18 0453  NA 140 141 141 141  K 3.8 3.1* 3.4* 3.4*  CL 108 110 112* 110  CO2 23 26 25 26   GLUCOSE 125* 101* 97 92  BUN 17 7* 8 5*  CREATININE 0.99 0.99 0.96 0.99  CALCIUM 8.7* 8.5* 8.1* 8.3*  MG  --   --  1.8 1.7   GFR: Estimated Creatinine Clearance: 63.5 mL/min (by C-G formula based on SCr of 0.99 mg/dL). Liver Function Tests: No results for input(s): AST, ALT, ALKPHOS, BILITOT, PROT, ALBUMIN in the last 168 hours. No results for input(s): LIPASE, AMYLASE  in the last 168 hours. No results for input(s): AMMONIA in the last 168 hours. Coagulation Profile: Recent Labs  Lab 07/21/18 1637  INR 1.15   Cardiac Enzymes: No results for input(s): CKTOTAL, CKMB, CKMBINDEX, TROPONINI in the last 168 hours. BNP (last 3 results) No results for input(s): PROBNP in the last 8760 hours. HbA1C: No results for input(s): HGBA1C in the last 72 hours. CBG: Recent Labs  Lab 07/23/18 1859  GLUCAP 128*   Lipid Profile: No results for input(s): CHOL, HDL, LDLCALC, TRIG, CHOLHDL, LDLDIRECT in the last 72 hours. Thyroid Function Tests: No results for input(s): TSH, T4TOTAL, FREET4, T3FREE, THYROIDAB in the last 72 hours. Anemia Panel: No results for input(s): VITAMINB12, FOLATE, FERRITIN, TIBC, IRON, RETICCTPCT in the last 72 hours. Sepsis Labs: No results for input(s): PROCALCITON, LATICACIDVEN in the last 168 hours.  No results found for this or any previous visit (from the past 240 hour(s)).       Radiology Studies: No results found.      Scheduled Meds: . cholecalciferol  2,000 Units Oral Daily   Continuous Infusions:    LOS: 3 days        Aline August, MD [facility] Pager [phone]  If 7PM-7AM, please contact night-coverage [email or web address] Password [facility] 07/25/2018, 11:25 AM

## 2018-07-25 NOTE — Progress Notes (Signed)
Patient tolerating full liquid diet , says he's ready to eat "real food". Patient's last 2 BM's haven't been bloody (RN did not observe).

## 2018-07-25 NOTE — Progress Notes (Addendum)
          Daily Rounding Note  07/25/2018, 8:22 AM  LOS: 3 days   SUBJECTIVE:   Chief complaint: Lower GI hemorrhage following extensive EMR polypectomy 1 week ago.    The last time he passed any stool or blood was yesterday morning when he had hematochezia.  He feels well.  Denies dizziness.  Feeling a little bit weak. No abdominal pain  OBJECTIVE:         Vital signs in last 24 hours:    Temp:  [97.4 F (36.3 C)-98.6 F (37 C)] 98.1 F (36.7 C) (11/12 0505) Pulse Rate:  [68-74] 72 (11/12 0505) Resp:  [18] 18 (11/12 0505) BP: (107-128)/(63-73) 112/73 (11/12 0505) SpO2:  [98 %-100 %] 99 % (11/12 0505) Weight:  [84.6 kg] 84.6 kg (11/12 0505) Last BM Date: 07/24/18 Filed Weights   07/22/18 2309 07/24/18 0425 07/25/18 0505  Weight: 85.1 kg 89.5 kg 84.6 kg   General: Alert, looks well.  Talkative. Heart: RRR. Chest: Clear bilaterally.  No labored breathing, no cough. Abdomen: Active bowel sounds, soft, nontender, nondistended. Extremities: No CCE. Neuro/Psych: Alert.  Oriented x3.  No tremor.  Intake/Output from previous day: 11/11 0701 - 11/12 0700 In: 725 [P.O.:360; I.V.:50; Blood:315] Out: 1201 [Urine:1200; Stool:1]  Intake/Output this shift: No intake/output data recorded.  Lab Results: Recent Labs    07/24/18 0733 07/24/18 1708 07/25/18 0453  WBC 3.3*  --  3.7*  HGB 7.7* 8.6* 8.8*  HCT 23.5* 26.4* 26.7*  PLT 136*  --  167   BMET Recent Labs    07/23/18 0536 07/24/18 0733 07/25/18 0453  NA 141 141 141  K 3.1* 3.4* 3.4*  CL 110 112* 110  CO2 26 25 26   GLUCOSE 101* 97 92  BUN 7* 8 5*  CREATININE 0.99 0.96 0.99  CALCIUM 8.5* 8.1* 8.3*   Scheduled Meds: . cholecalciferol  2,000 Units Oral Daily  . potassium chloride  40 mEq Oral Once   Continuous Infusions: PRN Meds:.acetaminophen **OR** acetaminophen, ondansetron **OR** ondansetron (ZOFRAN) IV   ASSESMENT:   *  Post polypectomy bleed.    *    ABL anemia.  Appropriate response to 1 U PRBC 11/11.    *   Hypokalemia.  Potassium tablet ordered.   PLAN   *   Observe.  Ambulate in hallway.  *   Advance diet to FL, question advance further, to soft diet?  *    Observe overnight, CBC in the morning. If no further bleeding, possibly DC home tomorrow    Azucena Freed  07/25/2018, 8:22 AM Phone [phone]   Attending physician's note   I have taken an interval history, reviewed the chart and examined the patient. I agree with the Advanced Practitioner's note, impression and recommendations.   Doing very well. No further bleeding. We will been stable at 8.8 (s/p 1U PRBCs). Plan: advance diet, recheck CBC in a.m. If no further bleeding, D/C home. Dr Stephanie Acre informed regarding the progress.  Carmell Austria, MD

## 2018-07-25 NOTE — Care Management Important Message (Signed)
Important Message  Patient Details  Name: Gerhardt Gleed MRN: [id number] Date of Birth: [birth year]   Medicare Important Message Given:  Yes    Barb Merino Breyon Sigg 07/25/2018, 2:12 PM

## 2018-07-25 NOTE — Plan of Care (Signed)
  Problem: Education: Goal: Knowledge of General Education information will improve Description: Including pain rating scale, medication(s)/side effects and non-pharmacologic comfort measures Outcome: Progressing   Problem: Coping: Goal: Level of anxiety will decrease Outcome: Progressing   

## 2018-07-26 DIAGNOSIS — I10 Essential (primary) hypertension: Secondary | ICD-10-CM

## 2018-07-26 DIAGNOSIS — R55 Syncope and collapse: Secondary | ICD-10-CM

## 2018-07-26 LAB — CBC
HCT: 26.8 % — ABNORMAL LOW (ref 39.0–52.0)
Hemoglobin: 8.9 g/dL — ABNORMAL LOW (ref 13.0–17.0)
MCH: 30.3 pg (ref 26.0–34.0)
MCHC: 33.2 g/dL (ref 30.0–36.0)
MCV: 91.2 fL (ref 80.0–100.0)
Platelets: 167 10*3/uL (ref 150–400)
RBC: 2.94 MIL/uL — ABNORMAL LOW (ref 4.22–5.81)
RDW: 13.5 % (ref 11.5–15.5)
WBC: 3.6 10*3/uL — ABNORMAL LOW (ref 4.0–10.5)
nRBC: 0 % (ref 0.0–0.2)

## 2018-07-26 LAB — BASIC METABOLIC PANEL
Anion gap: 4 — ABNORMAL LOW (ref 5–15)
BUN: 9 mg/dL (ref 8–23)
CO2: 25 mmol/L (ref 22–32)
Calcium: 8.6 mg/dL — ABNORMAL LOW (ref 8.9–10.3)
Chloride: 110 mmol/L (ref 98–111)
Creatinine, Ser: 1 mg/dL (ref 0.61–1.24)
GFR calc Af Amer: >60 mL/min (ref >60)
GFR calc non Af Amer: >60 mL/min (ref >60)
Glucose, Bld: 89 mg/dL (ref 70–99)
Potassium: 3.4 mmol/L — ABNORMAL LOW (ref 3.5–5.1)
Sodium: 139 mmol/L (ref 135–145)

## 2018-07-26 LAB — MAGNESIUM: Magnesium: 1.8 mg/dL (ref 1.7–2.4)

## 2018-07-26 MED ORDER — ONDANSETRON HCL 4 MG PO TABS: 4.0000 mg | ORAL_TABLET | Freq: Four times a day (QID) | ORAL | 0 refills | Status: DC | PRN

## 2018-07-26 MED ORDER — POTASSIUM CHLORIDE CRYS ER 20 MEQ PO TBCR
40.0000 meq | EXTENDED_RELEASE_TABLET | ORAL | Status: AC
  Administered 2018-07-26 (×2): 40 meq via ORAL
  Filled 2018-07-26 (×2): qty 2

## 2018-07-26 MED ORDER — ACETAMINOPHEN 325 MG PO TABS: 650.0000 mg | ORAL_TABLET | Freq: Four times a day (QID) | ORAL | Status: DC | PRN

## 2018-07-26 MED ORDER — POLYETHYLENE GLYCOL 3350 17 G PO PACK
32.0000 g | PACK | Freq: Once | ORAL | Status: AC
  Administered 2018-07-26: 32 g via ORAL
  Filled 2018-07-26: qty 2

## 2018-07-26 MED ORDER — BISACODYL 5 MG PO TBEC
10.0000 mg | DELAYED_RELEASE_TABLET | Freq: Once | ORAL | Status: AC
  Administered 2018-07-26: 10 mg via ORAL
  Filled 2018-07-26: qty 2

## 2018-07-26 MED ORDER — POLYETHYLENE GLYCOL 3350 17 G PO PACK: 32.0000 g | PACK | Freq: Every day | ORAL | 0 refills | Status: DC | PRN

## 2018-07-26 NOTE — Discharge Summary (Signed)
Physician Discharge Summary  [name] [name] HCW:[id number] DOB: [birth year] DOA: 07/21/2018  PCP: Cassandria Anger, MD  Admit date: 07/21/2018 Discharge date: 07/27/2018  Admitted From: home  Disposition:  home   Recommendations for Outpatient Follow-up:  1. F/u with PCP in 1 wk for Hb check 2. BP low - resume HCTZ as needed  Discharge Condition:  stable   CODE STATUS:  Full code   Consultations:  GI    Discharge Diagnoses:  Principal Problem:   GI bleed Active Problems:   Acute blood loss anemia   Essential hypertension   PAF (paroxysmal atrial fibrillation) (HCC)  Syncope Hypokalemia    Brief Summary: 77 year old male with history of hypertension, hypercholesterolemia, paroxysmal atrial fibrillation on Eliquis who recently underwent colonoscopy and polypectomy at [facility] on November 4 presented with bright red blood red per rectum and drop in hemoglobin.  GI  consulted.  Hospital Course:  Post polypectomy lower GI bleeding/Acute blood loss anemia -Probably secondary to above.  Monitor.  Plan as above -  Status post 1 unit packed red cells transfusion on 07/24/2018.  Hemoglobin has remained ~ 8-9 range -GI following.  Diet advanced by GI which he is tolerating well. He has stopped bleeding  Syncope -Probably from vasovagal episode from GI bleeding which happened on 07/23/2018.   Paroxysmal atrial fibrillation -Rate controlled on Metoprolol.  Eliquis on hold - Do not resume for 2 wks per GI  Hypokalemia -Replaced   Hypertension -Stable blood pressure.   Hold HCTZ    Discharge Exam: Vitals:   07/26/18 0831 07/26/18 1245  BP: 133/62 120/66  Pulse: 74 74  Resp:  17  Temp:  97.7 F (36.5 C)  SpO2: 100% 99%   Vitals:   07/25/18 2039 07/26/18 0418 07/26/18 0831 07/26/18 1245  BP: 123/73 116/71 133/62 120/66  Pulse: 70 77 74 74  Resp:  20  17  Temp: 98 F (36.7 C) 98 F (36.7 C)  97.7 F (36.5 C)  TempSrc: Oral Oral  Oral  SpO2: 99% 98% 100% 99%   Weight:  83.6 kg    Height:        General: Pt is alert, awake, not in acute distress Cardiovascular: RRR, S1/S2 +, murmur 2/6 at RUS border and Apex Respiratory: CTA bilaterally, no wheezing, no rhonchi Abdominal: Soft, NT, ND, bowel sounds + Extremities: no edema, no cyanosis   Discharge Instructions  Discharge Instructions    Diet - low sodium heart healthy   Complete by:  As directed    Increase activity slowly   Complete by:  As directed      Allergies as of 07/26/2018      Reactions   Xarelto [rivaroxaban] Other (See Comments), Hypertension   INCREASED BP-HYPERTENSIVE EVENTS   Corticosteroids Other (See Comments)   Made the patient  "sick," feel "weird," and his "body rejected" them Other reaction(s): Other (See Comments) Made the patient "sick," feel "weird," and his "body rejected" them   Ramipril Other (See Comments)   Could not eat or sleep, lost muscle mass   Benazepril Cough      Medication List    STOP taking these medications   ELIQUIS 5 MG Tabs tablet Generic drug:  apixaban   hydrochlorothiazide 25 MG tablet Commonly known as:  HYDRODIURIL     TAKE these medications   acetaminophen 325 MG tablet Commonly known as:  TYLENOL Take 2 tablets (650 mg total) by mouth every 6 (six) hours as needed for mild pain (or Fever >/=  101).   metoprolol succinate 50 MG 24 hr tablet Commonly known as:  TOPROL-XL TAKE 1 TABLET (50 MG TOTAL) BY MOUTH 2 (TWO) TIMES DAILY. TAKE WITH OR IMMEDIATELY FOLLOWING A MEAL. What changed:  additional instructions   ondansetron 4 MG tablet Commonly known as:  ZOFRAN Take 1 tablet (4 mg total) by mouth every 6 (six) hours as needed for nausea.   polyethylene glycol packet Commonly known as:  MIRALAX / GLYCOLAX Take 32 g by mouth daily as needed for mild constipation.   Vitamin D3 50 MCG (2000 UT) capsule Take 1 capsule (2,000 Units total) by mouth daily.      Follow-up Information    [name], [name], MD. Go on  08/01/2018.   Specialty:  Internal Medicine Why:  with need repeat CBC and Anemia panel@10 :[address] Contact information: [place] [address] [phone]        Jerene Bears, MD. Schedule an appointment as soon as possible for a visit on 08/28/2018.   Specialty:  Gastroenterology Why:  9:45 AM appointment Contact information: [address] [place] [address] [phone]        Thelma Comp, MD Follow up.   Specialty:  Gastroenterology Why:  call his office if you have questions about colonoscopy or GI bleeding Contact information: [id number] [employer] [place] [address] [phone]          Allergies  Allergen Reactions  . Xarelto [Rivaroxaban] Other (See Comments) and Hypertension    INCREASED BP-HYPERTENSIVE EVENTS  . Corticosteroids Other (See Comments)    Made the patient  "sick," feel "weird," and his "body rejected" them Other reaction(s): Other (See Comments) Made the patient "sick," feel "weird," and his "body rejected" them  . Ramipril Other (See Comments)    Could not eat or sleep, lost muscle mass  . Benazepril Cough     Procedures/Studies:   No results found.   The results of significant diagnostics from this hospitalization (including imaging, microbiology, ancillary and laboratory) are listed below for reference.     Microbiology: No results found for this or any previous visit (from the past 240 hour(s)).   Labs: BNP (last 3 results) Recent Labs    10/27/17 1452 02/28/18 1238  BNP 556.9* 403.4*   Basic Metabolic Panel: Recent Labs  Lab 07/21/18 0826 07/23/18 0536 07/24/18 0733 07/25/18 0453 07/26/18 0358  NA 140 141 141 141 139  K 3.8 3.1* 3.4* 3.4* 3.4*  CL 108 110 112* 110 110  CO2 23 26 25 26 25   GLUCOSE 125* 101* 97 92 89  BUN 17 7* 8 5* 9  CREATININE 0.99 0.99 0.96 0.99 1.00  CALCIUM 8.7* 8.5* 8.1* 8.3* 8.6*  MG  --   --  1.8 1.7 1.8   Liver Function Tests: No results  for input(s): AST, ALT, ALKPHOS, BILITOT, PROT, ALBUMIN in the last 168 hours. No results for input(s): LIPASE, AMYLASE in the last 168 hours. No results for input(s): AMMONIA in the last 168 hours. CBC: Recent Labs  Lab 07/21/18 0826  07/22/18 0607  07/23/18 1853 07/24/18 0733 07/24/18 1708 07/25/18 0453 07/26/18 0358  WBC 5.4  --  3.8*  --   --  3.3*  --  3.7* 3.6*  NEUTROABS 3.6  --   --   --   --  1.8  --  1.8  --   HGB 12.0*   < > 10.2*   < > 9.6* 7.7* 8.6*  8.8* 8.9*  HCT 37.5*   < > 31.2*   < > 29.5* 23.5* 26.4* 26.7* 26.8*  MCV 95.7  --  92.9  --   --  92.9  --  91.1 91.2  PLT 144*  --  140*  --   --  136*  --  167 167   < > = values in this interval not displayed.   Cardiac Enzymes: No results for input(s): CKTOTAL, CKMB, CKMBINDEX, TROPONINI in the last 168 hours. BNP: Invalid input(s): POCBNP CBG: Recent Labs  Lab 07/23/18 1859  GLUCAP 128*   D-Dimer No results for input(s): DDIMER in the last 72 hours. Hgb A1c No results for input(s): HGBA1C in the last 72 hours. Lipid Profile No results for input(s): CHOL, HDL, LDLCALC, TRIG, CHOLHDL, LDLDIRECT in the last 72 hours. Thyroid function studies No results for input(s): TSH, T4TOTAL, T3FREE, THYROIDAB in the last 72 hours.  Invalid input(s): FREET3 Anemia work up No results for input(s): VITAMINB12, FOLATE, FERRITIN, TIBC, IRON, RETICCTPCT in the last 72 hours. Urinalysis    Component Value Date/Time   COLORURINE YELLOW 10/21/2016 2355   APPEARANCEUR HAZY (A) 10/21/2016 2355   LABSPEC 1.017 10/21/2016 2355   PHURINE 5.0 10/21/2016 2355   GLUCOSEU NEGATIVE 10/21/2016 2355   GLUCOSEU NEGATIVE 11/19/2015 1352   HGBUR SMALL (A) 10/21/2016 2355   BILIRUBINUR NEGATIVE 10/21/2016 2355   KETONESUR 5 (A) 10/21/2016 2355   PROTEINUR NEGATIVE 10/21/2016 2355   UROBILINOGEN 0.2 11/19/2015 1352   NITRITE NEGATIVE 10/21/2016 2355   LEUKOCYTESUR NEGATIVE 10/21/2016 2355   Sepsis Labs Invalid input(s):  PROCALCITONIN,  WBC,  LACTICIDVEN Microbiology No results found for this or any previous visit (from the past 240 hour(s)).   Time coordinating discharge in minutes: 55  SIGNED:   Debbe Odea, MD  [facility] 07/27/2018, 3:28 PM Pager   If 7PM-7AM, please contact night-coverage [email or web address] Password [email or web address]

## 2018-07-26 NOTE — Progress Notes (Addendum)
Daily Rounding Note  07/26/2018, 9:30 AM  LOS: 4 days   SUBJECTIVE:   Chief complaint: post plypectomy  bleeding    Patient tells me he has not had a bowel movement in 3 days.  He is very concerned, thinks he has a blockage.  Not convinced that he has not stopped bleeding. Would feel more reassured if he was having bowel movements and they were not bloody. Tolerating full liquids  OBJECTIVE:         Vital signs in last 24 hours:    Temp:  [98 F (36.7 C)-98.2 F (36.8 C)] 98 F (36.7 C) (11/13 0418) Pulse Rate:  [66-77] 74 (11/13 0831) Resp:  [18-20] 20 (11/13 0418) BP: (91-133)/(47-73) 133/62 (11/13 0831) SpO2:  [98 %-100 %] 100 % (11/13 0831) Weight:  [83.6 kg] 83.6 kg (11/13 0418) Last BM Date: 07/24/18 Filed Weights   07/24/18 0425 07/25/18 0505 07/26/18 0418  Weight: 89.5 kg 84.6 kg 83.6 kg   General: He looks fine Heart: RRR Chest: Clear Abdomen: Soft, nontender.  Nondistended Extremities: No CCE. Neuro/Psych: Oriented x3.  Anxious.  No gross deficits or tremors.  Intake/Output from previous day: 11/12 0701 - 11/13 0700 In: 240 [P.O.:240] Out: 425 [Urine:425]  Intake/Output this shift: Total I/O In: 120 [P.O.:120] Out: 300 [Urine:300]  Lab Results: Recent Labs    07/24/18 0733 07/24/18 1708 07/25/18 0453 07/26/18 0358  WBC 3.3*  --  3.7* 3.6*  HGB 7.7* 8.6* 8.8* 8.9*  HCT 23.5* 26.4* 26.7* 26.8*  PLT 136*  --  167 167   BMET Recent Labs    07/24/18 0733 07/25/18 0453 07/26/18 0358  NA 141 141 139  K 3.4* 3.4* 3.4*  CL 112* 110 110  CO2 25 26 25   GLUCOSE 97 92 89  BUN 8 5* 9  CREATININE 0.96 0.99 1.00  CALCIUM 8.1* 8.3* 8.6*    Studies/Results: No results found.   Scheduled Meds: . cholecalciferol  2,000 Units Oral Daily  . potassium chloride  40 mEq Oral Q4H   Continuous Infusions: PRN Meds:.acetaminophen **OR** acetaminophen, ondansetron **OR** ondansetron (ZOFRAN)  IV   ASSESMENT:   *   Post polypectomy bleed.  Resolved.     *   Blood loss anemia.  1 U PRBC on 11/11.  Hgb stable.      *   Chronic Eliquis for A fib.  Held pre and post colonoscopy.    *   Anxiety   PLAN   *   Restart Eliquis 11/25 (per Dr Bryan Lemma).    *  From GI standpoint he can go home.  However the patient is very anxious about what happens if he starts rebleeding when he goes home. Explained to patient we cannot keep him just because he might rebleed and that all signs point to his bleeding has resolved.  *    Treat constipation with 32 g of MiraLAX and 10 mg Dulcolax now.  Hope that non-bloody BM will  Reassure pt.    *   fup with Dr Hilarie Fredrickson on 12/16.  Can also call Dr Marquis Buggy office if he has questions about GI bleeding and colonoscopy.      Azucena Freed  07/26/2018, 9:30 AM Phone [phone]   Attending physician's note   I have taken an interval history, reviewed the chart and examined the patient. I agree with the Advanced Practitioner's note, impression and recommendations.   77 year old with history of A.  Fib on Eliquis,  S/p EMR large hepatic flexure polyp by Dr. Stephanie Acre 16/9, complicated by post polypectomy bleeding requiring 1 unit of PRBC. Hb stable 8-9.0 range. No further bleeding. Plan: can D/C with GI follow-up, resume Eliquis in 2 weeks. Symptomatic management of constipation. Will sign off for now. Dr Stephanie Acre informed.   Carmell Austria, MD

## 2018-07-26 NOTE — Discharge Instructions (Signed)
Resume Eliquis in 2 wks.   You were cared for by a hospitalist during your hospital stay. If you have any questions about your discharge medications or the care you received while you were in the hospital after you are discharged, you can call the unit and asked to speak with the hospitalist on call if the hospitalist that took care of you is not available. Once you are discharged, your primary care physician will handle any further medical issues.   Please note that NO REFILLS for any discharge medications will be authorized once you are discharged, as it is imperative that you return to your primary care physician (or establish a relationship with a primary care physician if you do not have one) for your aftercare needs so that they can reassess your need for medications and monitor your lab values.  Please take all your medications with you for your next visit with your Primary MD. Please ask your Primary MD to get all Hospital records sent to his/her office. Please request your Primary MD to go over all hospital test results at the follow up.   If you experience worsening of your admission symptoms, develop shortness of breath, chest pain, suicidal or homicidal thoughts or a life threatening emergency, you must seek medical attention immediately by calling 911 or calling your MD.   Dennis Bast must read the complete instructions/literature along with all the possible adverse reactions/side effects for all the medicines you take including new medications that have been prescribed to you. Take new medicines after you have completely understood and accpet all the possible adverse reactions/side effects.    Do not drive when taking pain medications or sedatives.     Do not take more than prescribed Pain, Sleep and Anxiety Medications   If you have smoked or chewed Tobacco in the last 2 yrs please stop. Stop any regular alcohol  and or recreational drug use.   Wear Seat belts while driving.

## 2018-07-26 NOTE — Progress Notes (Signed)
Patient discharged: Home with family  Via: Wheelchair   Discharge paperwork given: to patient and family  Reviewed with teach back  IV and telemetry disconnected  Belongings given to patient    

## 2018-07-27 ENCOUNTER — Telehealth: Payer: Self-pay | Admitting: *Deleted

## 2018-07-27 DIAGNOSIS — Z6826 Body mass index (BMI) 26.0-26.9, adult: Secondary | ICD-10-CM | POA: Diagnosis not present

## 2018-07-27 DIAGNOSIS — K648 Other hemorrhoids: Secondary | ICD-10-CM | POA: Diagnosis not present

## 2018-07-27 DIAGNOSIS — K922 Gastrointestinal hemorrhage, unspecified: Secondary | ICD-10-CM | POA: Diagnosis not present

## 2018-07-27 DIAGNOSIS — Z800 Family history of malignant neoplasm of digestive organs: Secondary | ICD-10-CM | POA: Diagnosis not present

## 2018-07-27 DIAGNOSIS — Z980 Intestinal bypass and anastomosis status: Secondary | ICD-10-CM | POA: Diagnosis not present

## 2018-07-27 DIAGNOSIS — R42 Dizziness and giddiness: Secondary | ICD-10-CM | POA: Diagnosis not present

## 2018-07-27 DIAGNOSIS — Z7901 Long term (current) use of anticoagulants: Secondary | ICD-10-CM | POA: Diagnosis not present

## 2018-07-27 DIAGNOSIS — Z86010 Personal history of colonic polyps: Secondary | ICD-10-CM | POA: Diagnosis not present

## 2018-07-27 DIAGNOSIS — I10 Essential (primary) hypertension: Secondary | ICD-10-CM | POA: Diagnosis not present

## 2018-07-27 DIAGNOSIS — Z79899 Other long term (current) drug therapy: Secondary | ICD-10-CM | POA: Diagnosis not present

## 2018-07-27 DIAGNOSIS — Z888 Allergy status to other drugs, medicaments and biological substances status: Secondary | ICD-10-CM | POA: Diagnosis not present

## 2018-07-27 DIAGNOSIS — I480 Paroxysmal atrial fibrillation: Secondary | ICD-10-CM | POA: Diagnosis not present

## 2018-07-27 DIAGNOSIS — K644 Residual hemorrhoidal skin tags: Secondary | ICD-10-CM | POA: Diagnosis not present

## 2018-07-27 DIAGNOSIS — K921 Melena: Secondary | ICD-10-CM | POA: Insufficient documentation

## 2018-07-27 DIAGNOSIS — E785 Hyperlipidemia, unspecified: Secondary | ICD-10-CM | POA: Diagnosis not present

## 2018-07-27 NOTE — Telephone Encounter (Signed)
Called pt to verify hosp f/u appt that has been made for 08/01/18. Per wife yes the hosp made appt and they are aware. Completed TCM call below.[name] [name]  Transition Care Management Follow-up Telephone Call   Date discharged? 07/26/18   How have you been since you were released from the hospital? Wife states he is doing alright   Do you understand why you were in the hospital? YES   Do you understand the discharge instructions? YES   Where were you discharged to? Home   Items Reviewed:  Medications reviewed: YES  Allergies reviewed: YES  Dietary changes reviewed: YES  Referrals reviewed: YES, appt w/GI not until 08/28/18   Functional Questionnaire:   Activities of Daily Living (ADLs):   He states he are independent in the following: bathing and hygiene, feeding, continence, grooming, toileting and dressing States they require assistance with the following: ambulation   Any transportation issues/concerns?: NO   Any patient concerns? NO   Confirmed importance and date/time of follow-up visits scheduled YES, appt 08/01/18  Provider Appointment booked with Dr. Alain Marion  Confirmed with patient if condition begins to worsen call PCP or go to the ER.  Patient was given the office number and encouraged to call back with question or concerns.  : YES

## 2018-07-28 ENCOUNTER — Other Ambulatory Visit: Payer: Self-pay

## 2018-07-28 DIAGNOSIS — Z980 Intestinal bypass and anastomosis status: Secondary | ICD-10-CM | POA: Diagnosis not present

## 2018-07-28 DIAGNOSIS — K921 Melena: Secondary | ICD-10-CM | POA: Diagnosis not present

## 2018-07-28 DIAGNOSIS — D62 Acute posthemorrhagic anemia: Secondary | ICD-10-CM | POA: Diagnosis not present

## 2018-07-28 DIAGNOSIS — I10 Essential (primary) hypertension: Secondary | ICD-10-CM | POA: Diagnosis not present

## 2018-07-28 DIAGNOSIS — Z9889 Other specified postprocedural states: Secondary | ICD-10-CM | POA: Diagnosis not present

## 2018-07-28 DIAGNOSIS — Z86010 Personal history of colonic polyps: Secondary | ICD-10-CM | POA: Diagnosis not present

## 2018-07-28 DIAGNOSIS — I4891 Unspecified atrial fibrillation: Secondary | ICD-10-CM | POA: Diagnosis not present

## 2018-07-28 DIAGNOSIS — K648 Other hemorrhoids: Secondary | ICD-10-CM | POA: Diagnosis not present

## 2018-07-28 MED ORDER — LACTATED RINGERS IV SOLN
10.00 | INTRAVENOUS | Status: DC
Start: ? — End: 2018-07-28

## 2018-07-28 MED ORDER — METOPROLOL SUCCINATE ER 25 MG PO TB24
25.00 | ORAL_TABLET | ORAL | Status: DC
Start: 2018-07-28 — End: 2018-07-28

## 2018-07-28 MED ORDER — ACETAMINOPHEN 325 MG PO TABS
650.00 | ORAL_TABLET | ORAL | Status: DC
Start: ? — End: 2018-07-28

## 2018-08-01 ENCOUNTER — Ambulatory Visit (INDEPENDENT_AMBULATORY_CARE_PROVIDER_SITE_OTHER): Payer: Medicare Other | Admitting: Internal Medicine

## 2018-08-01 ENCOUNTER — Other Ambulatory Visit (INDEPENDENT_AMBULATORY_CARE_PROVIDER_SITE_OTHER): Payer: Medicare Other

## 2018-08-01 ENCOUNTER — Encounter: Payer: Self-pay | Admitting: Internal Medicine

## 2018-08-01 VITALS — BP 132/78 | HR 65 | Temp 98.0°F | Ht 70.0 in | Wt 190.0 lb

## 2018-08-01 DIAGNOSIS — I480 Paroxysmal atrial fibrillation: Secondary | ICD-10-CM

## 2018-08-01 DIAGNOSIS — I10 Essential (primary) hypertension: Secondary | ICD-10-CM

## 2018-08-01 DIAGNOSIS — R7989 Other specified abnormal findings of blood chemistry: Secondary | ICD-10-CM | POA: Diagnosis not present

## 2018-08-01 DIAGNOSIS — K922 Gastrointestinal hemorrhage, unspecified: Secondary | ICD-10-CM | POA: Diagnosis not present

## 2018-08-01 DIAGNOSIS — I2510 Atherosclerotic heart disease of native coronary artery without angina pectoris: Secondary | ICD-10-CM | POA: Insufficient documentation

## 2018-08-01 DIAGNOSIS — I4891 Unspecified atrial fibrillation: Secondary | ICD-10-CM | POA: Diagnosis not present

## 2018-08-01 DIAGNOSIS — N320 Bladder-neck obstruction: Secondary | ICD-10-CM | POA: Diagnosis not present

## 2018-08-01 DIAGNOSIS — R109 Unspecified abdominal pain: Secondary | ICD-10-CM

## 2018-08-01 LAB — LIPID PANEL
Cholesterol: 144 mg/dL (ref 0–200)
HDL: 28.2 mg/dL — ABNORMAL LOW (ref >39.00)
LDL Cholesterol: 82 mg/dL (ref 0–99)
NonHDL: 115.38
Total CHOL/HDL Ratio: 5
Triglycerides: 167 mg/dL — ABNORMAL HIGH (ref 0.0–149.0)
VLDL: 33.4 mg/dL (ref 0.0–40.0)

## 2018-08-01 LAB — CBC WITH DIFFERENTIAL/PLATELET
Basophils Absolute: 0 10*3/uL (ref 0.0–0.1)
Basophils Relative: 0.4 % (ref 0.0–3.0)
Eosinophils Absolute: 0.1 10*3/uL (ref 0.0–0.7)
Eosinophils Relative: 2.2 % (ref 0.0–5.0)
HCT: 32.4 % — ABNORMAL LOW (ref 39.0–52.0)
Hemoglobin: 11.1 g/dL — ABNORMAL LOW (ref 13.0–17.0)
Lymphocytes Relative: 27.7 % (ref 12.0–46.0)
Lymphs Abs: 1.3 10*3/uL (ref 0.7–4.0)
MCHC: 34.3 g/dL (ref 30.0–36.0)
MCV: 91.7 fl (ref 78.0–100.0)
Monocytes Absolute: 0.5 10*3/uL (ref 0.1–1.0)
Monocytes Relative: 10.8 % (ref 3.0–12.0)
Neutro Abs: 2.7 10*3/uL (ref 1.4–7.7)
Neutrophils Relative %: 58.9 % (ref 43.0–77.0)
Platelets: 295 10*3/uL (ref 150.0–400.0)
RBC: 3.53 Mil/uL — ABNORMAL LOW (ref 4.22–5.81)
RDW: 14.2 % (ref 11.5–15.5)
WBC: 4.5 10*3/uL (ref 4.0–10.5)

## 2018-08-01 LAB — BASIC METABOLIC PANEL
BUN: 13 mg/dL (ref 6–23)
CO2: 29 mEq/L (ref 19–32)
Calcium: 9.5 mg/dL (ref 8.4–10.5)
Chloride: 104 mEq/L (ref 96–112)
Creatinine, Ser: 0.85 mg/dL (ref 0.40–1.50)
GFR: 92.55 mL/min (ref >60.00)
Glucose, Bld: 102 mg/dL — ABNORMAL HIGH (ref 70–99)
Potassium: 4.3 mEq/L (ref 3.5–5.1)
Sodium: 140 mEq/L (ref 135–145)

## 2018-08-01 LAB — PSA: PSA: 4.2 ng/mL — ABNORMAL HIGH (ref 0.10–4.00)

## 2018-08-01 LAB — TSH: TSH: 5.79 u[IU]/mL — ABNORMAL HIGH (ref 0.35–4.50)

## 2018-08-01 NOTE — Assessment & Plan Note (Signed)
Calcium in the coronaries on CT 2018 Labs Toprol Pt declined Statins

## 2018-08-01 NOTE — Progress Notes (Signed)
Subjective:  Patient ID: [name] [name], male    DOB: 1941/05/06  Age: 77 y.o. MRN: [id number]  CC: No chief complaint on file.   HPI Malon Orrick presents for recent hosp stay for lower GI bleeding (d/c'd on 07/26/18). Per hx:  "77 year old male with history of hypertension, hypercholesterolemia, paroxysmal atrial fibrillation on Eliquis who recently underwent colonoscopy and polypectomy at [facility] on November 4 presented with bright red blood red per rectum and drop in hemoglobin. GI consulted.  Hospital Course:  Post polypectomy lower GI bleeding/Acute blood loss anemia -Probably secondary to above. Monitor. Plan as above - Status post 1 unit packed red cells transfusion on 07/24/2018. Hemoglobin has remained ~ 8-9 range -GI following. Diet advanced by GI which he is tolerating well. He has stopped bleeding  Syncope -Probably from vasovagal episode from GI bleedingwhich happened on 07/23/2018.   Paroxysmal atrial fibrillation -Rate controlled on Metoprolol. Eliquis on hold - Do not resume for 2 wks per GI  Hypokalemia -Replaced   Hypertension -Stable blood pressure. Hold HCTZ"     Outpatient Medications Prior to Visit  Medication Sig Dispense Refill  . acetaminophen (TYLENOL) 325 MG tablet Take 2 tablets (650 mg total) by mouth every 6 (six) hours as needed for mild pain (or Fever >/= 101).    . Cholecalciferol (VITAMIN D3) 2000 units capsule Take 1 capsule (2,000 Units total) by mouth daily. 100 capsule 3  . metoprolol succinate (TOPROL-XL) 50 MG 24 hr tablet TAKE 1 TABLET (50 MG TOTAL) BY MOUTH 2 (TWO) TIMES DAILY. TAKE WITH OR IMMEDIATELY FOLLOWING A MEAL. (Patient taking differently: Take 50 mg by mouth 2 (two) times daily. ) 180 tablet 3  . ondansetron (ZOFRAN) 4 MG tablet Take 1 tablet (4 mg total) by mouth every 6 (six) hours as needed for nausea. 20 tablet 0  . polyethylene glycol (MIRALAX / GLYCOLAX) packet Take 32 g by mouth daily as needed for mild  constipation. 14 each 0   No facility-administered medications prior to visit.     ROS: Review of Systems  Constitutional: Positive for fatigue. Negative for appetite change and unexpected weight change.  HENT: Negative for congestion, nosebleeds, sneezing, sore throat and trouble swallowing.   Eyes: Negative for itching and visual disturbance.  Respiratory: Negative for cough.   Cardiovascular: Negative for chest pain, palpitations and leg swelling.  Gastrointestinal: Negative for abdominal distention, blood in stool, diarrhea and nausea.  Genitourinary: Negative for frequency and hematuria.  Musculoskeletal: Negative for back pain, gait problem, joint swelling and neck pain.  Skin: Negative for rash.  Neurological: Negative for dizziness, tremors, speech difficulty and weakness.  Psychiatric/Behavioral: Negative for agitation, dysphoric mood, sleep disturbance and suicidal ideas. The patient is not nervous/anxious.     Objective:  BP 132/78 (BP Location: Left Arm, Patient Position: Sitting, Cuff Size: Normal)   Pulse 65   Temp 98 F (36.7 C) (Oral)   Ht 5\' 10"  (1.778 m)   Wt 190 lb (86.2 kg)   SpO2 97%   BMI 27.26 kg/m   BP Readings from Last 3 Encounters:  08/01/18 132/78  07/26/18 120/66  03/09/18 (!) 156/88    Wt Readings from Last 3 Encounters:  08/01/18 190 lb (86.2 kg)  07/26/18 184 lb 6.4 oz (83.6 kg)  03/09/18 199 lb 9.6 oz (90.5 kg)    Physical Exam  Constitutional: He is oriented to person, place, and time. He appears well-developed. No distress.  NAD  HENT:  Mouth/Throat: Oropharynx is clear and moist.  Eyes: Pupils are equal, round, and reactive to light. Conjunctivae are normal.  Neck: Normal range of motion. No JVD present. No thyromegaly present.  Cardiovascular: Normal rate, regular rhythm, normal heart sounds and intact distal pulses. Exam reveals no gallop and no friction rub.  No murmur heard. Pulmonary/Chest: Effort normal and breath sounds  normal. No respiratory distress. He has no wheezes. He has no rales. He exhibits no tenderness.  Abdominal: Soft. Bowel sounds are normal. He exhibits no distension and no mass. There is no tenderness. There is no rebound and no guarding.  Musculoskeletal: Normal range of motion. He exhibits no edema or tenderness.  Lymphadenopathy:    He has no cervical adenopathy.  Neurological: He is alert and oriented to person, place, and time. He has normal reflexes. No cranial nerve deficit. He exhibits normal muscle tone. He displays a negative Romberg sign. Coordination and gait normal.  Skin: Skin is warm and dry. No rash noted.  Psychiatric: He has a normal mood and affect. His behavior is normal. Judgment and thought content normal.    Lab Results  Component Value Date   WBC 3.6 (L) 07/26/2018   HGB 8.9 (L) 07/26/2018   HCT 26.8 (L) 07/26/2018   PLT 167 07/26/2018   GLUCOSE 89 07/26/2018   CHOL 141 03/02/2018   TRIG 137 03/02/2018   HDL 22 (L) 03/02/2018   LDLDIRECT 151.5 11/20/2007   LDLCALC 92 03/02/2018   ALT 19 10/28/2017   AST 26 10/28/2017   NA 139 07/26/2018   K 3.4 (L) 07/26/2018   CL 110 07/26/2018   CREATININE 1.00 07/26/2018   BUN 9 07/26/2018   CO2 25 07/26/2018   TSH 4.26 12/23/2016   PSA 2.76 03/04/2015   INR 1.15 07/21/2018   HGBA1C 5.7 (H) 08/02/2016    No results found.  Assessment & Plan:   There are no diagnoses linked to this encounter.   No orders of the defined types were placed in this encounter.    Follow-up: No follow-ups on file.  [name], MD

## 2018-08-01 NOTE — Assessment & Plan Note (Signed)
Metoprolol, Hydrodiuril 

## 2018-08-01 NOTE — Assessment & Plan Note (Signed)
TSH 

## 2018-08-01 NOTE — Assessment & Plan Note (Signed)
ASA 81 mg, Eliquis is on hold

## 2018-08-01 NOTE — Assessment & Plan Note (Addendum)
CBC ASA 81 mg, Eliquis is on hold Chart reviewed: Post polypectomy lower GI bleeding/Acute blood loss anemia -Probably secondary to above. Monitor. Plan as above - Status post 1 unit packed red cells transfusion on 07/24/2018. Hemoglobin has remained ~ 8-9 range

## 2018-08-01 NOTE — Assessment & Plan Note (Signed)
Eliquis -- on hold, Toprol

## 2018-08-01 NOTE — Patient Instructions (Signed)
Heart Disease Prevention Heart disease is a leading cause of death. There are many things you can do to help prevent heart disease. Be physically active Physical activity is good for your heart. It helps control your blood pressure, cholesterol levels, and weight. Try to be physically active every day. Ask your health care provider what activities are best for you. Be a healthy weight Extra weight can strain your heart and affect your blood pressure and cholesterol levels. Lose weight with diet and exercise if recommended by your health care provider. Eat heart-healthy foods Follow a healthy eating plan as recommended by your health care provider or dietitian. Heart-healthy foods include:  High-fiber foods. These include oat bran, oatmeal, and whole-grain breads and cereals.  Fruits and vegetables.  Avoid:  Alcohol.  Fried foods.  Foods high in saturated fat. These include meats, butter, whole dairy products, shortening, and coconut or palm oil.  Salty foods. These include canned food, luncheon meat, salty snacks, and fast food.  Keep your cholesterol levels under control Cholesterol is a substance that is used for many important functions. When your cholesterol levels are high, cholesterol can stick to the insides of your blood vessels, making them narrow or clog. This can lead to chest pain (angina) and a heart attack. Keep your cholesterol levels under control as recommended by your health care provider. Have your cholesterol checked at least once a year. Target cholesterol levels (in mg/dL) for most people are:  Total cholesterol below 200.  LDL cholesterol below 100.  HDL cholesterol above 40 in men and above 50 in women.  Triglycerides below 150.  Keep your blood pressure under control Having high blood pressure (hypertension) puts you at risk for stroke and other forms of heart disease. Keep your blood pressure under control as recommended by your health care provider. Ask  your health care provider if you need treatment to lower your blood pressure. If you are 53-26 years of age, have your blood pressure checked every 3-5 years. If you are 40 years of age or older, have your blood pressure checked every year. Do not use tobacco products Tobacco smoke can damage your heart and blood vessels. Do not use any tobacco products including cigarettes, chewing tobacco, or electronic cigarettes. If you need help quitting, ask your health care provider. Take medicines as directed Take medicines only as directed by your health care provider. Ask your health care provider whether you should take an aspirin every day. Taking aspirin can help reduce your risk of heart disease and stroke. Where to find more information: To find out more about heart disease, visit the American Heart Association's website at www.americanheart.org This information is not intended to replace advice given to you by your health care provider. Make sure you discuss any questions you have with your health care provider. Document Released: 04/13/2004 Document Revised: 01/28/2016 Document Reviewed: 10/24/2013 Elsevier Interactive Patient Education  [employer].

## 2018-08-01 NOTE — Assessment & Plan Note (Signed)
Resolved

## 2018-08-01 NOTE — Assessment & Plan Note (Signed)
BP Readings from Last 3 Encounters:  08/01/18 132/78  07/26/18 120/66  03/09/18 (!) 156/88

## 2018-08-02 ENCOUNTER — Other Ambulatory Visit: Payer: Self-pay | Admitting: Internal Medicine

## 2018-08-02 DIAGNOSIS — R972 Elevated prostate specific antigen [PSA]: Secondary | ICD-10-CM

## 2018-08-02 DIAGNOSIS — D509 Iron deficiency anemia, unspecified: Secondary | ICD-10-CM

## 2018-08-02 DIAGNOSIS — I480 Paroxysmal atrial fibrillation: Secondary | ICD-10-CM

## 2018-08-07 ENCOUNTER — Encounter: Payer: Self-pay | Admitting: Neurology

## 2018-08-08 ENCOUNTER — Other Ambulatory Visit: Payer: Self-pay | Admitting: *Deleted

## 2018-08-08 DIAGNOSIS — M479 Spondylosis, unspecified: Secondary | ICD-10-CM

## 2018-08-17 ENCOUNTER — Encounter: Payer: Self-pay | Admitting: *Deleted

## 2018-08-17 DIAGNOSIS — M5416 Radiculopathy, lumbar region: Secondary | ICD-10-CM | POA: Diagnosis not present

## 2018-08-17 DIAGNOSIS — Z6828 Body mass index (BMI) 28.0-28.9, adult: Secondary | ICD-10-CM | POA: Diagnosis not present

## 2018-08-17 DIAGNOSIS — M545 Low back pain: Secondary | ICD-10-CM | POA: Diagnosis not present

## 2018-08-17 DIAGNOSIS — M47896 Other spondylosis, lumbar region: Secondary | ICD-10-CM | POA: Diagnosis not present

## 2018-08-22 ENCOUNTER — Ambulatory Visit (INDEPENDENT_AMBULATORY_CARE_PROVIDER_SITE_OTHER): Payer: Medicare Other | Admitting: Neurology

## 2018-08-22 DIAGNOSIS — G629 Polyneuropathy, unspecified: Secondary | ICD-10-CM

## 2018-08-22 DIAGNOSIS — M479 Spondylosis, unspecified: Secondary | ICD-10-CM

## 2018-08-22 DIAGNOSIS — M5417 Radiculopathy, lumbosacral region: Secondary | ICD-10-CM

## 2018-08-22 NOTE — Procedures (Signed)
[facility]  [place], [address]  [place], [place] [address] Tel: [phone] Fax:  [phone] Test Date:  08/22/2018  Patient: [name] [name] DOB: 1941-01-05 Physician: Narda Amber, DO  Sex: Male Height: 5\' 10"  Ref Phys: Rennis Harding, MD  ID#: [id number] Temp: 33.0C Technician:    Patient Complaints: This is a 77 year old gentleman referred for evaluation of chronic low back pain radiating into the right leg.  NCV & EMG Findings: Extensive electrodiagnostic testing of the right lower extremity and additional studies of the left shows: 1. Right superficial peroneal sensory response is within normal limits.  Right sural and left superficial peroneal sensory responses are mildly reduced.  The left sural sensory nerve is absent. 2. Bilateral peroneal motor responses are absent at the extensor digitorum brevis, and normal at the tibialis anterior.  Bilateral tibial motor responses are within normal limits. 3. Right tibial H reflex study is within normal limits. 4. Chronic motor axonal loss changes are seen affecting the L4-5 myotomes bilaterally, which is more apparent on the right.  There is no evidence of accompanied active denervation.  Impression: 1. Chronic L4-5 radiculopathy affecting the right lower extremity, moderate in degree electrically.   2. Chronic sensorimotor polyneuropathy affecting the lower extremities, which is mild and worse on the left.   ___________________________ Narda Amber, DO    Nerve Conduction Studies Anti Sensory Summary Table   Site NR P.eak (ms) Norm Peak (ms) P-T Amp (V) Norm P-T Amp  Left Sup Peroneal Anti Sensory (Ant Lat Mall)  33C  12 cm    3.2 <4.6 2.6 >3  Right Sup Peroneal Anti Sensory (Ant Lat Mall)  33C  12 cm    2.6 <4.6 5.9 >3  Left Sural Anti Sensory (Lat Mall)  33C  Calf NR  <4.6  >3  Right Sural Anti Sensory (Lat Mall)  33C  Calf    2.9 <4.6 2.9 >3   Motor Summary Table   Site NR Onset (ms) Norm Onset  (ms) O-P Amp (mV) Norm O-P Amp Site1 Site2 Delta-0 (ms) Dist (cm) Vel (m/s) Norm Vel (m/s)  Left Peroneal Motor (Ext Dig Brev)  33C  Ankle NR  <6.0  >2.5 B Fib Ankle  0.0  >40  B Fib NR     Poplt B Fib  0.0  >40  Poplt NR            Right Peroneal Motor (Ext Dig Brev)  33C  Ankle NR  <6.0  >2.5 B Fib Ankle  0.0  >40  B Fib NR     Poplt B Fib  0.0  >40  Poplt NR            Left Peroneal TA Motor (Tib Ant)  33C  Fib Head    3.8 <4.5 4.0 >3 Poplit Fib Head 0.9 8.0 89 >40  Poplit    4.7  3.9         Right Peroneal TA Motor (Tib Ant)  33C  Fib Head    3.7 <4.5 3.3 >3 Poplit Fib Head 1.2 9.0 75 >40  Poplit    4.9  3.3         Left Tibial Motor (Abd Hall Brev)  33C  Ankle    3.8 <6.0 7.4 >4 Knee Ankle 9.4 39.0 41 >40  Knee    13.2  4.7         Right Tibial Motor (Abd Hall Brev)  33C  Ankle    5.9 <  6.0 8.9 >4 Knee Ankle 8.8 42.0 48 >40  Knee    14.7  6.2          H Reflex Studies   NR H-Lat (ms) Lat Norm (ms) L-R H-Lat (ms)  Right Tibial (Gastroc)  33C     34.56 <35    EMG   Side Muscle Ins Act Fibs Psw Fasc Number Recrt Dur Dur. Amp Amp. Poly Poly. Comment  Right AntTibialis Nml Nml Nml Nml 1- Rapid Few 1+ Few 1+ Nml Nml N/A  Right Gastroc Nml Nml Nml Nml 1- Rapid Some 1+ Some 1+ Nml Nml N/A  Right Flex Dig Long Nml Nml Nml Nml Nml Nml Nml Nml Nml Nml Nml Nml N/A  Right RectFemoris Nml Nml Nml Nml 1- Rapid Some 1+ Some 1+ Nml Nml N/A  Right GluteusMed Nml Nml Nml Nml 1- Rapid Few 1+ Few 1+ Nml Nml N/A  Left AntTibialis Nml Nml Nml Nml 1- Rapid Few 1+ Few 1+ Nml Nml N/A  Left Gastroc Nml Nml Nml Nml Nml Nml Nml Nml Nml Nml Nml Nml N/A  Left Flex Dig Long Nml Nml Nml Nml 1- Rapid Few 1+ Few 1+ Nml Nml N/A  Left RectFemoris Nml Nml Nml Nml Nml Nml Nml Nml Nml Nml Nml Nml N/A  Left GluteusMed Nml Nml Nml Nml Nml Nml Nml Nml Nml Nml Nml Nml N/A      Waveforms:

## 2018-08-28 ENCOUNTER — Ambulatory Visit: Payer: Medicare Other | Admitting: Internal Medicine

## 2018-09-13 HISTORY — PX: CARDIAC CATHETERIZATION: SHX172

## 2018-09-13 HISTORY — PX: CARDIAC VALVE REPLACEMENT: SHX585

## 2018-09-14 DIAGNOSIS — M5416 Radiculopathy, lumbar region: Secondary | ICD-10-CM | POA: Diagnosis not present

## 2018-09-20 DIAGNOSIS — M5416 Radiculopathy, lumbar region: Secondary | ICD-10-CM | POA: Diagnosis not present

## 2018-09-24 ENCOUNTER — Other Ambulatory Visit (HOSPITAL_COMMUNITY): Payer: Self-pay | Admitting: Student

## 2018-09-26 NOTE — Telephone Encounter (Signed)
Pt not prescribed KLOR-CON by cardiology, also has not followed-up with Dr Aundra Dubin for further care. Note in chart starts to d/c 03/03/18.

## 2018-10-03 ENCOUNTER — Ambulatory Visit (INDEPENDENT_AMBULATORY_CARE_PROVIDER_SITE_OTHER): Payer: Medicare Other | Admitting: Internal Medicine

## 2018-10-03 ENCOUNTER — Other Ambulatory Visit (HOSPITAL_COMMUNITY): Payer: Self-pay

## 2018-10-03 ENCOUNTER — Encounter: Payer: Self-pay | Admitting: Internal Medicine

## 2018-10-03 ENCOUNTER — Other Ambulatory Visit (INDEPENDENT_AMBULATORY_CARE_PROVIDER_SITE_OTHER): Payer: Medicare Other

## 2018-10-03 DIAGNOSIS — M79605 Pain in left leg: Secondary | ICD-10-CM | POA: Diagnosis not present

## 2018-10-03 DIAGNOSIS — E7800 Pure hypercholesterolemia, unspecified: Secondary | ICD-10-CM | POA: Diagnosis not present

## 2018-10-03 DIAGNOSIS — D509 Iron deficiency anemia, unspecified: Secondary | ICD-10-CM | POA: Diagnosis not present

## 2018-10-03 DIAGNOSIS — I10 Essential (primary) hypertension: Secondary | ICD-10-CM

## 2018-10-03 DIAGNOSIS — I480 Paroxysmal atrial fibrillation: Secondary | ICD-10-CM

## 2018-10-03 DIAGNOSIS — M79604 Pain in right leg: Secondary | ICD-10-CM | POA: Diagnosis not present

## 2018-10-03 DIAGNOSIS — D62 Acute posthemorrhagic anemia: Secondary | ICD-10-CM

## 2018-10-03 DIAGNOSIS — R972 Elevated prostate specific antigen [PSA]: Secondary | ICD-10-CM | POA: Diagnosis not present

## 2018-10-03 LAB — CBC WITH DIFFERENTIAL/PLATELET
Basophils Absolute: 0 10*3/uL (ref 0.0–0.1)
Basophils Relative: 0.4 % (ref 0.0–3.0)
Eosinophils Absolute: 0.2 10*3/uL (ref 0.0–0.7)
Eosinophils Relative: 2.8 % (ref 0.0–5.0)
HCT: 39.9 % (ref 39.0–52.0)
Hemoglobin: 13.5 g/dL (ref 13.0–17.0)
Lymphocytes Relative: 20.5 % (ref 12.0–46.0)
Lymphs Abs: 1.1 10*3/uL (ref 0.7–4.0)
MCHC: 33.8 g/dL (ref 30.0–36.0)
MCV: 91.4 fl (ref 78.0–100.0)
Monocytes Absolute: 0.6 10*3/uL (ref 0.1–1.0)
Monocytes Relative: 11 % (ref 3.0–12.0)
Neutro Abs: 3.6 10*3/uL (ref 1.4–7.7)
Neutrophils Relative %: 65.3 % (ref 43.0–77.0)
Platelets: 214 10*3/uL (ref 150.0–400.0)
RBC: 4.37 Mil/uL (ref 4.22–5.81)
RDW: 13.2 % (ref 11.5–15.5)
WBC: 5.5 10*3/uL (ref 4.0–10.5)

## 2018-10-03 LAB — BASIC METABOLIC PANEL
BUN: 15 mg/dL (ref 6–23)
CO2: 31 mEq/L (ref 19–32)
Calcium: 9.6 mg/dL (ref 8.4–10.5)
Chloride: 101 mEq/L (ref 96–112)
Creatinine, Ser: 0.85 mg/dL (ref 0.40–1.50)
GFR: 87.04 mL/min (ref >60.00)
Glucose, Bld: 93 mg/dL (ref 70–99)
Potassium: 4 mEq/L (ref 3.5–5.1)
Sodium: 139 mEq/L (ref 135–145)

## 2018-10-03 LAB — TSH: TSH: 4.76 u[IU]/mL — ABNORMAL HIGH (ref 0.35–4.50)

## 2018-10-03 LAB — T4, FREE: Free T4: 0.61 ng/dL (ref 0.60–1.60)

## 2018-10-03 MED ORDER — HYDROCODONE-ACETAMINOPHEN 5-325 MG PO TABS: 0.5000 | ORAL_TABLET | Freq: Four times a day (QID) | ORAL | 0 refills | Status: DC | PRN

## 2018-10-03 NOTE — Patient Instructions (Signed)
CBD oil 5-10 mg twice a day for pain  Weighted blanket  Cardiac CT calcium scoring test $150   Computed tomography, more commonly known as a CT or CAT scan, is a diagnostic medical imaging test. Like traditional x-rays, it produces multiple images or pictures of the inside of the body. The cross-sectional images generated during a CT scan can be reformatted in multiple planes. They can even generate three-dimensional images. These images can be viewed on a computer monitor, printed on film or by a 3D printer, or transferred to a CD or DVD. CT images of internal organs, bones, soft tissue and blood vessels provide greater detail than traditional x-rays, particularly of soft tissues and blood vessels. A cardiac CT scan for coronary calcium is a non-invasive way of obtaining information about the presence, location and extent of calcified plaque in the coronary arteries-the vessels that supply oxygen-containing blood to the heart muscle. Calcified plaque results when there is a build-up of fat and other substances under the inner layer of the artery. This material can calcify which signals the presence of atherosclerosis, a disease of the vessel wall, also called coronary artery disease (CAD). People with this disease have an increased risk for heart attacks. In addition, over time, progression of plaque build up (CAD) can narrow the arteries or even close off blood flow to the heart. The result may be chest pain, sometimes called "angina," or a heart attack. Because calcium is a marker of CAD, the amount of calcium detected on a cardiac CT scan is a helpful prognostic tool. The findings on cardiac CT are expressed as a calcium score. Another name for this test is coronary artery calcium scoring.  What are some common uses of the procedure? The goal of cardiac CT scan for calcium scoring is to determine if CAD is present and to what extent, even if there are no symptoms. It is a screening study that may  be recommended by a physician for patients with risk factors for CAD but no clinical symptoms. The major risk factors for CAD are: . high blood cholesterol levels  . family history of heart attacks  . diabetes  . high blood pressure  . cigarette smoking  . overweight or obese  . physical inactivity   A negative cardiac CT scan for calcium scoring shows no calcification within the coronary arteries. This suggests that CAD is absent or so minimal it cannot be seen by this technique. The chance of having a heart attack over the next two to five years is very low under these circumstances. A positive test means that CAD is present, regardless of whether or not the patient is experiencing any symptoms. The amount of calcification-expressed as the calcium score-may help to predict the likelihood of a myocardial infarction (heart attack) in the coming years and helps your medical doctor or cardiologist decide whether the patient may need to take preventive medicine or undertake other measures such as diet and exercise to lower the risk for heart attack. The extent of CAD is graded according to your calcium score:  Calcium Score  Presence of CAD  0 No evidence of CAD   1-10 Minimal evidence of CAD  11-100 Mild evidence of CAD  101-400 Moderate evidence of CAD  Over 400 Extensive evidence of CAD

## 2018-10-03 NOTE — Progress Notes (Signed)
Subjective:  Patient ID: [name] [name], male    DOB: [birth year]  Age: 78 y.o. MRN: [id number]  CC: No chief complaint on file.   HPI Meredith Mair presents for LBP - seeing Dr Patrice Paradise. C/o R>L CP off and on. C/o R upper leg pain...  Outpatient Medications Prior to Visit  Medication Sig Dispense Refill  . acetaminophen (TYLENOL) 325 MG tablet Take 2 tablets (650 mg total) by mouth every 6 (six) hours as needed for mild pain (or Fever >/= 101).    . Cholecalciferol (VITAMIN D3) 2000 units capsule Take 1 capsule (2,000 Units total) by mouth daily. 100 capsule 3  . metoprolol succinate (TOPROL-XL) 50 MG 24 hr tablet TAKE 1 TABLET (50 MG TOTAL) BY MOUTH 2 (TWO) TIMES DAILY. TAKE WITH OR IMMEDIATELY FOLLOWING A MEAL. (Patient taking differently: Take 50 mg by mouth 2 (two) times daily. ) 180 tablet 3  . ondansetron (ZOFRAN) 4 MG tablet Take 1 tablet (4 mg total) by mouth every 6 (six) hours as needed for nausea. 20 tablet 0  . polyethylene glycol (MIRALAX / GLYCOLAX) packet Take 32 g by mouth daily as needed for mild constipation. 14 each 0   No facility-administered medications prior to visit.     ROS: Review of Systems  Constitutional: Positive for fatigue. Negative for appetite change and unexpected weight change.  HENT: Negative for congestion, nosebleeds, sneezing, sore throat and trouble swallowing.   Eyes: Negative for itching and visual disturbance.  Respiratory: Negative for cough.   Cardiovascular: Negative for chest pain, palpitations and leg swelling.  Gastrointestinal: Negative for abdominal distention, blood in stool, diarrhea and nausea.  Genitourinary: Negative for frequency and hematuria.  Musculoskeletal: Positive for back pain and gait problem. Negative for joint swelling and neck pain.  Skin: Negative for rash.  Neurological: Negative for dizziness, tremors, speech difficulty and weakness.  Psychiatric/Behavioral: Negative for agitation, dysphoric mood, sleep disturbance  and suicidal ideas. The patient is not nervous/anxious.     Objective:  BP 134/80 (BP Location: Left Arm, Patient Position: Sitting, Cuff Size: Normal)   Pulse 61   Temp 98 F (36.7 C) (Oral)   Ht 5\' 10"  (1.778 m)   Wt 194 lb (88 kg)   SpO2 99%   BMI 27.84 kg/m   BP Readings from Last 3 Encounters:  10/03/18 134/80  08/01/18 132/78  07/26/18 120/66    Wt Readings from Last 3 Encounters:  10/03/18 194 lb (88 kg)  08/01/18 190 lb (86.2 kg)  07/26/18 184 lb 6.4 oz (83.6 kg)    Physical Exam Constitutional:      General: He is not in acute distress.    Appearance: He is well-developed.     Comments: NAD  Eyes:     Conjunctiva/sclera: Conjunctivae normal.     Pupils: Pupils are equal, round, and reactive to light.  Neck:     Musculoskeletal: Normal range of motion.     Thyroid: No thyromegaly.     Vascular: No JVD.  Cardiovascular:     Rate and Rhythm: Normal rate and regular rhythm.     Heart sounds: Normal heart sounds. No murmur. No friction rub. No gallop.   Pulmonary:     Effort: Pulmonary effort is normal. No respiratory distress.     Breath sounds: Normal breath sounds. No wheezing or rales.  Chest:     Chest wall: No tenderness.  Abdominal:     General: Bowel sounds are normal. There is no distension.  Palpations: Abdomen is soft. There is no mass.     Tenderness: There is no abdominal tenderness. There is no guarding or rebound.  Musculoskeletal: Normal range of motion.        General: Tenderness present.  Lymphadenopathy:     Cervical: No cervical adenopathy.  Skin:    General: Skin is warm and dry.     Findings: No rash.  Neurological:     Mental Status: He is alert and oriented to person, place, and time.     Cranial Nerves: No cranial nerve deficit.     Motor: No abnormal muscle tone.     Coordination: Coordination normal.     Gait: Gait normal.     Deep Tendon Reflexes: Reflexes are normal and symmetric.  Psychiatric:        Behavior:  Behavior normal.        Thought Content: Thought content normal.        Judgment: Judgment normal.   LS tender    Lab Results  Component Value Date   WBC 4.5 08/01/2018   HGB 11.1 (L) 08/01/2018   HCT 32.4 (L) 08/01/2018   PLT 295.0 08/01/2018   GLUCOSE 102 (H) 08/01/2018   CHOL 144 08/01/2018   TRIG 167.0 (H) 08/01/2018   HDL 28.20 (L) 08/01/2018   LDLDIRECT 151.5 11/20/2007   LDLCALC 82 08/01/2018   ALT 19 10/28/2017   AST 26 10/28/2017   NA 140 08/01/2018   K 4.3 08/01/2018   CL 104 08/01/2018   CREATININE 0.85 08/01/2018   BUN 13 08/01/2018   CO2 29 08/01/2018   TSH 5.79 (H) 08/01/2018   PSA 4.20 (H) 08/01/2018   INR 1.15 07/21/2018   HGBA1C 5.7 (H) 08/02/2016    No results found.  Assessment & Plan:   There are no diagnoses linked to this encounter.   No orders of the defined types were placed in this encounter.    Follow-up: No follow-ups on file.  [name], MD

## 2018-10-03 NOTE — Assessment & Plan Note (Signed)
Pt refused statins CT ca scoring offered 1/20

## 2018-10-03 NOTE — Assessment & Plan Note (Signed)
CBC

## 2018-10-03 NOTE — Assessment & Plan Note (Signed)
  Metoprolol, Hydrodiuril 

## 2018-10-03 NOTE — Assessment & Plan Note (Addendum)
Stopped Eliquis 2 mo ago on his own The pt was asked to re-start Eliquis

## 2018-10-03 NOTE — Assessment & Plan Note (Addendum)
R>>L Refused X ray Appt w/Dr Patrice Paradise tomorrow

## 2018-10-04 ENCOUNTER — Other Ambulatory Visit: Payer: Self-pay | Admitting: Internal Medicine

## 2018-10-04 DIAGNOSIS — K922 Gastrointestinal hemorrhage, unspecified: Secondary | ICD-10-CM

## 2018-10-04 DIAGNOSIS — R972 Elevated prostate specific antigen [PSA]: Secondary | ICD-10-CM

## 2018-10-04 DIAGNOSIS — R7989 Other specified abnormal findings of blood chemistry: Secondary | ICD-10-CM

## 2018-10-04 DIAGNOSIS — I4891 Unspecified atrial fibrillation: Secondary | ICD-10-CM

## 2018-10-04 DIAGNOSIS — M47896 Other spondylosis, lumbar region: Secondary | ICD-10-CM | POA: Diagnosis not present

## 2018-10-04 DIAGNOSIS — M5416 Radiculopathy, lumbar region: Secondary | ICD-10-CM | POA: Diagnosis not present

## 2018-10-04 DIAGNOSIS — M545 Low back pain: Secondary | ICD-10-CM | POA: Diagnosis not present

## 2018-10-04 LAB — PSA, TOTAL AND FREE
PSA, % Free: 24 % (calc) — ABNORMAL LOW (ref >25)
PSA, Free: 1 ng/mL
PSA, Total: 4.1 ng/mL — ABNORMAL HIGH (ref <=4.0)

## 2018-10-04 LAB — IRON,TIBC AND FERRITIN PANEL
%SAT: 14 % (calc) — ABNORMAL LOW (ref 20–48)
Ferritin: 263 ng/mL (ref 24–380)
Iron: 39 ug/dL — ABNORMAL LOW (ref 50–180)
TIBC: 279 mcg/dL (calc) (ref 250–425)

## 2018-10-13 ENCOUNTER — Ambulatory Visit (HOSPITAL_COMMUNITY)
Admission: RE | Admit: 2018-10-13 | Discharge: 2018-10-13 | Disposition: A | Discharge status: Home / Self Care | Payer: Medicare Other | Source: Ambulatory Visit | Attending: Cardiology | Admitting: Cardiology

## 2018-10-13 ENCOUNTER — Encounter (HOSPITAL_COMMUNITY): Payer: Self-pay | Admitting: Cardiology

## 2018-10-13 VITALS — BP 130/90 | HR 68 | Wt 191.0 lb

## 2018-10-13 DIAGNOSIS — I480 Paroxysmal atrial fibrillation: Secondary | ICD-10-CM

## 2018-10-13 DIAGNOSIS — I712 Thoracic aortic aneurysm, without rupture: Secondary | ICD-10-CM | POA: Insufficient documentation

## 2018-10-13 DIAGNOSIS — I7121 Aneurysm of the ascending aorta, without rupture: Secondary | ICD-10-CM

## 2018-10-13 DIAGNOSIS — I359 Nonrheumatic aortic valve disorder, unspecified: Secondary | ICD-10-CM | POA: Diagnosis not present

## 2018-10-13 DIAGNOSIS — R079 Chest pain, unspecified: Secondary | ICD-10-CM

## 2018-10-13 DIAGNOSIS — I350 Nonrheumatic aortic (valve) stenosis: Secondary | ICD-10-CM | POA: Diagnosis not present

## 2018-10-13 DIAGNOSIS — Z7901 Long term (current) use of anticoagulants: Secondary | ICD-10-CM | POA: Insufficient documentation

## 2018-10-13 DIAGNOSIS — Z8249 Family history of ischemic heart disease and other diseases of the circulatory system: Secondary | ICD-10-CM | POA: Diagnosis not present

## 2018-10-13 DIAGNOSIS — I10 Essential (primary) hypertension: Secondary | ICD-10-CM | POA: Diagnosis not present

## 2018-10-13 DIAGNOSIS — Q231 Congenital insufficiency of aortic valve: Secondary | ICD-10-CM | POA: Diagnosis not present

## 2018-10-13 DIAGNOSIS — Z79899 Other long term (current) drug therapy: Secondary | ICD-10-CM | POA: Insufficient documentation

## 2018-10-13 MED ORDER — APIXABAN 5 MG PO TABS: 5.0000 mg | ORAL_TABLET | Freq: Two times a day (BID) | ORAL | 6 refills | Status: DC

## 2018-10-13 NOTE — Patient Instructions (Signed)
RESTART Eliquis 5mg  (1 tab) twice a day  Your physician has requested that you have an echocardiogram. Echocardiography is a painless test that uses sound waves to create images of your heart. It provides your doctor with information about the size and shape of your heart and how well your heart's chambers and valves are working. This procedure takes approximately one hour. There are no restrictions for this procedure. You have been scheduled today.  Your physician has requested that you have cardiac CT. Cardiac computed tomography (CT) is a painless test that uses an x-ray machine to take clear, detailed pictures of your heart. For further information please visit [email or web address]. Please follow instruction sheet as given.  Your physician recommends that you schedule a follow-up appointment in: 6 weeks with Dr. Aundra Dubin

## 2018-10-15 NOTE — Progress Notes (Signed)
Patient ID: [name] [name], male   DOB: 1941/07/18, 78 y.o.   MRN: [id number] PCP: Dr. Alain Marion Cardiology: Dr. Aundra Dubin  78 y.o.with history of HTN and paroxysmal atrial fibrillation returns for followup. He was hospitalized with atrial fibrillation/RVR in 5/14.  He had TEE-guided cardioversion.  TEE showed bicuspid aortic valve with mild AS and a moderately dilated ascending aorta.  Most recent echo in 4/17 showed moderate aortic stenosis and MRA chest in 11/17 showed 4.1 cm ascending aorta.   He was on Xarelto for anticoagulation but stopped it as he was convinced it was causing joint pains.  He then refused to start any other anticoagulation.    In 5/17, he had been back in atrial fibrillation for several days and was symptomatic.  I started him on Eliquis and planned TEE-guided DCCV given significant symptoms, but he converted back to NSR on his own.  He continued Eliquis for about 1 month then stopped it on his own.    In 11/17, he was hospitalized with symptomatic atrial fibrillation with RVR.  I started him on diltiazem CD and Eliquis with plan for TEE-guided DCCV.  However, he converted back to NSR on his own. He stopped the diltiazem but has continued the Eliquis.   He was admitted in 2/18 with fever, LUL PNA and left-sided pleural effusion.  Thoracentesis on left was suggestive of parapneumonic effusion.  He was in the hospital 11 days.  During that time, he went into atrial fibrillation with RVR.  He was started on amiodarone and went back into NSR.  Amiodarone was subsequently stopped.   Recurrent atrial fibrillation with RVR in 2/19, felt more fatigued.  He underwent TEE-guided DCCV back to NSR.   He had a lower GI bleed in 11/19 post-polypectomy and has not taken Eliquis since that time.  GI had told him that he could restart Eliquis at 2 wks but he decided not to.   He returns for followup of atrial fibrillation.  He is in NSR today.  Main problem is low back pain which limits his  ambulation.  He also has been having on and off chest discomfort, hard to tell if it is definitely exertional.  No palpitations, no dyspnea.  Generally he is very resistant to meds and has refused statins.    ECG (personally reviewed): NSR, left axis deviation, anterolateral TWIs.    Labs (5/14): K 3.7, creatinine 0.9, BNP 2261=>109, TSH normal Labs (7/14): K 3.5, creatinine 1.0 Labs (12/14): K 3.8, creatinine 1.0, LDL particle number 1374, LDL 107, TSH normal Labs (6/16): TSH normal, K 3.8, creatinine 0.84, HCT 42.4, LDL 85, LFTs normal Labs (3/17): K 3.8, creatinine 0.87 Labs (5/17): K 4.5, creatinine 0.97, HCT 43.7 Labs (11/17): K 3.2, creatinine 0.81, LDL 71, HDL 24 Labs (2/18): K 3.8, creatinine 0.89 Labs (2/19): K 3.7, creatinine 0.83, hgb 16 Labs (11/19): LDL 82 Labs (1/20): TSH elevated but free T4 normal, K 4, creatinine 0.85, hgb 13.5  Allergies (verified):  No Known Drug Allergies   Past Medical History:  1. Hypertension: ACEI cough.  2. Atrial fibrillation. The patient had new-onset atrial fibrillation in November 2009. He underwent ibutilide cardioversion successfully. He has had 1-2 episodes/year that are short-lived that likely are atrial fibrillation with RVR. He was admitted in 5/14 with atrial fibrillation/RVR and had TEE-guided DCCV.  Atrial fibrillation again in 5/17, converted back to NSR spontaneously.  CHADSVASC score 2.  - Atrial fibrillation 2/19 with TEE-guided DCCV.  3. Hypercholesterolemia.  4. Bicuspid aortic valve  disorder: Echo (7/11): EF 55-60%, mild LV hypertrophy, mild aortic stenosis with mean gradient 19 mmHg and peak gradient 36 mmHg.  TEE (5/14): EF 55%, mild LVH, bicuspid aortic valve with mild AS (mean gradient 13 mmHg), ascending aorta 4.5 cm.  Echo (9/15) with EF 65-70%, moderate AS (mean gradient 23 mmHg), mild AI, ascending aorta 4.1 cm, mild MR.   - Echo (4/17) with EF 65-70%, moderate aortic stenosis with mean gradient 27 mmHg, PASP 31 mmHg,  ascending aorta 4.4 cm.  - Echo (2/18) with EF 55-60%, bicuspid aortic valve with moderate aortic stenosis (underestimated gradient).  - TEE (2/19): EF 60-65%, moderate LVH, bicuspid aortic valve with moderate AS with mean gradient 28 mmHg and AVA 1.2 cm^2, 4.4 cm ascending aorta.  5. Osteoarthritis.  6. Low back pain.  7. Chest pain: ETT-myoview (12/11) with 10:51 exercise, no chest pain, no significant ST changes, EF 69%, no evidence for ischemia or infarction.  8. Ascending aortic aneurysm: Associated with bicuspid aortic valve.  4.5 cm by TEE in 5/14.   MRA chest (6/14) with bicuspid aortic valve, 4.3 cm ascending aortic aneurysm.  MRA chest (10/15) with 4.2 cm ascending aorta (bicuspid aortic valve noted).  Echo (4/17) with ascending aorta diameter 4.4 cm.  - MRA chest (11/17) with 4.1 cm ascending aorta.  - Echo (2/19): 4.4 cm ascending aorta.  9. Colonic polyps: GI bleeding in 11/19 s/p polypectomy.   Family History:  Hypertension. No early CAD.   Social History:  The patient lives in [place]. He is a native of [place]. He is retired Chief Financial Officer at [employer]. He drinks a glass of wine nightly. He is a nonsmoker. He is a former Microbiologist.   Review of Systems  All systems reviewed and negative except as per HPI.   Current Outpatient Medications  Medication Sig Dispense Refill  . Cholecalciferol (VITAMIN D3) 2000 units capsule Take 1 capsule (2,000 Units total) by mouth daily. 100 capsule 3  . HYDROcodone-acetaminophen (NORCO/VICODIN) 5-325 MG tablet Take 0.5-1 tablets by mouth every 6 (six) hours as needed for severe pain. 20 tablet 0  . ibuprofen (ADVIL,MOTRIN) 200 MG tablet Take 200 mg by mouth every 6 (six) hours as needed.    . metoprolol succinate (TOPROL-XL) 50 MG 24 hr tablet TAKE 1 TABLET (50 MG TOTAL) BY MOUTH 2 (TWO) TIMES DAILY. TAKE WITH OR IMMEDIATELY FOLLOWING A MEAL. (Patient taking differently: Take 50 mg by mouth 2 (two) times daily. ) 180 tablet 3   . acetaminophen (TYLENOL) 325 MG tablet Take 2 tablets (650 mg total) by mouth every 6 (six) hours as needed for mild pain (or Fever >/= 101). (Patient not taking: Reported on 10/13/2018)    . apixaban (ELIQUIS) 5 MG TABS tablet Take 1 tablet (5 mg total) by mouth 2 (two) times daily. 60 tablet 6  . ondansetron (ZOFRAN) 4 MG tablet Take 1 tablet (4 mg total) by mouth every 6 (six) hours as needed for nausea. (Patient not taking: Reported on 10/13/2018) 20 tablet 0  . polyethylene glycol (MIRALAX / GLYCOLAX) packet Take 32 g by mouth daily as needed for mild constipation. (Patient not taking: Reported on 10/13/2018) 14 each 0   No current facility-administered medications for this encounter.     BP 130/90   Pulse 68   Wt 86.6 kg (191 lb)   SpO2 94%   BMI 27.41 kg/m  General: NAD Neck: No JVD, no thyromegaly or thyroid nodule.  Lungs: Clear to auscultation bilaterally with normal  respiratory effort. CV: Nondisplaced PMI.  Heart regular S1/S2, no S3/S4, no murmur.  No peripheral edema.  No carotid bruit.  Normal pedal pulses.  Abdomen: Soft, nontender, no hepatosplenomegaly, no distention.  Skin: Intact without lesions or rashes.  Neurologic: Alert and oriented x 3.  Psych: Normal affect. Extremities: No clubbing or cyanosis.  HEENT: Normal.   Assessment/Plan: 1. Atrial fibrillation: Paroxysmal. He is quite symptomatic when in atrial fibrillation.  CHADSVASC = 3.  He is in NSR.   - He does not want to take an anti-arrhythmic medication.   - I asked him to restart apixaban 5 mg bid, he says that he will do this.   - He has not wanted atrial fibrillation ablation.  - Continue Toprol XL.  2. HTN: BP seems fairly controlled, resistant to additional meds. Continue Toprol XL.  3. Bicuspid aortic valve disorder: Moderate AS on 2/19 TEE, stable.  He has a moderately dilated ascending aorta to 4.4 cm on TEE in 2/19.   - Schedule echo in 2/20.  4. Chest pain: Increasing recently.  I will arrange  for coronary CTA with FFR if needed.    Followup with me in 6 weeks.   [name] 10/15/2018

## 2018-10-20 ENCOUNTER — Telehealth (HOSPITAL_COMMUNITY): Payer: Self-pay | Admitting: Emergency Medicine

## 2018-10-20 NOTE — Telephone Encounter (Signed)
Reaching out to patient to offer assistance regarding upcoming cardiac imaging study; pt verbalizes understanding of appt date/time, parking situation and where to check in, pre-test NPO status and medications ordered, and verified current allergies; name and call back number provided for further questions should they arise Keiji Melland RN Navigator Cardiac Imaging [facility] [phone] office [phone] cell 

## 2018-10-24 ENCOUNTER — Ambulatory Visit (HOSPITAL_COMMUNITY): Admission: RE | Admit: 2018-10-24 | Payer: Medicare Other | Source: Ambulatory Visit

## 2018-10-24 ENCOUNTER — Ambulatory Visit (HOSPITAL_COMMUNITY)
Admission: RE | Admit: 2018-10-24 | Discharge: 2018-10-24 | Disposition: A | Discharge status: Home / Self Care | Payer: Medicare Other | Source: Ambulatory Visit | Attending: Cardiology | Admitting: Cardiology

## 2018-10-24 DIAGNOSIS — I350 Nonrheumatic aortic (valve) stenosis: Secondary | ICD-10-CM | POA: Diagnosis not present

## 2018-10-24 MED ORDER — NITROGLYCERIN 0.4 MG SL SUBL
0.8000 mg | SUBLINGUAL_TABLET | Freq: Once | SUBLINGUAL | Status: AC
  Administered 2018-10-24: 0.8 mg via SUBLINGUAL
  Filled 2018-10-24: qty 25

## 2018-10-24 MED ORDER — NITROGLYCERIN 0.4 MG SL SUBL
SUBLINGUAL_TABLET | SUBLINGUAL | Status: AC
  Filled 2018-10-24: qty 2

## 2018-10-24 MED ORDER — IOPAMIDOL (ISOVUE-370) INJECTION 76%
80.0000 mL | Freq: Once | INTRAVENOUS | Status: AC | PRN
  Administered 2018-10-24: 80 mL via INTRAVENOUS

## 2018-10-25 DIAGNOSIS — Z6828 Body mass index (BMI) 28.0-28.9, adult: Secondary | ICD-10-CM | POA: Diagnosis not present

## 2018-10-25 DIAGNOSIS — M47896 Other spondylosis, lumbar region: Secondary | ICD-10-CM | POA: Diagnosis not present

## 2018-10-25 DIAGNOSIS — M545 Low back pain: Secondary | ICD-10-CM | POA: Diagnosis not present

## 2018-10-25 DIAGNOSIS — M5416 Radiculopathy, lumbar region: Secondary | ICD-10-CM | POA: Diagnosis not present

## 2018-10-26 ENCOUNTER — Ambulatory Visit (HOSPITAL_COMMUNITY)
Admission: RE | Admit: 2018-10-26 | Discharge: 2018-10-26 | Disposition: A | Discharge status: Home / Self Care | Payer: Medicare Other | Source: Ambulatory Visit | Attending: Internal Medicine | Admitting: Internal Medicine

## 2018-10-26 DIAGNOSIS — E785 Hyperlipidemia, unspecified: Secondary | ICD-10-CM | POA: Insufficient documentation

## 2018-10-26 DIAGNOSIS — I2510 Atherosclerotic heart disease of native coronary artery without angina pectoris: Secondary | ICD-10-CM | POA: Diagnosis not present

## 2018-10-26 DIAGNOSIS — I350 Nonrheumatic aortic (valve) stenosis: Secondary | ICD-10-CM | POA: Diagnosis not present

## 2018-10-26 DIAGNOSIS — I10 Essential (primary) hypertension: Secondary | ICD-10-CM | POA: Insufficient documentation

## 2018-10-26 DIAGNOSIS — I4891 Unspecified atrial fibrillation: Secondary | ICD-10-CM | POA: Insufficient documentation

## 2018-10-26 DIAGNOSIS — Q231 Congenital insufficiency of aortic valve: Secondary | ICD-10-CM | POA: Insufficient documentation

## 2018-10-26 NOTE — Progress Notes (Signed)
  Echocardiogram 2D Echocardiogram has been performed.  [name] [name] 10/26/2018, 11:43 AM

## 2018-10-30 ENCOUNTER — Telehealth (HOSPITAL_COMMUNITY): Payer: Self-pay

## 2018-10-30 NOTE — Telephone Encounter (Signed)
Pt reports not feeling any worse than his baseline. He will keep his appointment on 3/13. He will call office if any concerns arise.

## 2018-10-30 NOTE — Telephone Encounter (Signed)
-----   Message from Larey Dresser, MD sent at 10/30/2018  1:54 PM EST ----- That's fine as long as he feels ok ----- Message ----- From: Valeda Malm, RN Sent: 10/30/2018  12:46 PM EST To: Larey Dresser, MD  Pt has an appt in 3 weeks, is that too late to wait?  ----- Message ----- From: Larey Dresser, MD Sent: 10/27/2018   4:09 PM EST To: Scarlette Calico, RN, Harvie Junior, CMA, #  Aortic stenosis is now severe.  He is going to need evaluation for TAVR.  Please have him see me in the office to discuss soon.

## 2018-11-01 DIAGNOSIS — M5137 Other intervertebral disc degeneration, lumbosacral region: Secondary | ICD-10-CM | POA: Diagnosis not present

## 2018-11-01 DIAGNOSIS — M5136 Other intervertebral disc degeneration, lumbar region: Secondary | ICD-10-CM | POA: Diagnosis not present

## 2018-11-01 DIAGNOSIS — M9902 Segmental and somatic dysfunction of thoracic region: Secondary | ICD-10-CM | POA: Diagnosis not present

## 2018-11-01 DIAGNOSIS — M9903 Segmental and somatic dysfunction of lumbar region: Secondary | ICD-10-CM | POA: Diagnosis not present

## 2018-11-01 DIAGNOSIS — M5135 Other intervertebral disc degeneration, thoracolumbar region: Secondary | ICD-10-CM | POA: Diagnosis not present

## 2018-11-01 DIAGNOSIS — M9904 Segmental and somatic dysfunction of sacral region: Secondary | ICD-10-CM | POA: Diagnosis not present

## 2018-11-03 DIAGNOSIS — M9903 Segmental and somatic dysfunction of lumbar region: Secondary | ICD-10-CM | POA: Diagnosis not present

## 2018-11-03 DIAGNOSIS — M5136 Other intervertebral disc degeneration, lumbar region: Secondary | ICD-10-CM | POA: Diagnosis not present

## 2018-11-03 DIAGNOSIS — M9904 Segmental and somatic dysfunction of sacral region: Secondary | ICD-10-CM | POA: Diagnosis not present

## 2018-11-03 DIAGNOSIS — M5137 Other intervertebral disc degeneration, lumbosacral region: Secondary | ICD-10-CM | POA: Diagnosis not present

## 2018-11-03 DIAGNOSIS — M9902 Segmental and somatic dysfunction of thoracic region: Secondary | ICD-10-CM | POA: Diagnosis not present

## 2018-11-03 DIAGNOSIS — M5135 Other intervertebral disc degeneration, thoracolumbar region: Secondary | ICD-10-CM | POA: Diagnosis not present

## 2018-11-08 DIAGNOSIS — M5135 Other intervertebral disc degeneration, thoracolumbar region: Secondary | ICD-10-CM | POA: Diagnosis not present

## 2018-11-08 DIAGNOSIS — M5137 Other intervertebral disc degeneration, lumbosacral region: Secondary | ICD-10-CM | POA: Diagnosis not present

## 2018-11-08 DIAGNOSIS — M9902 Segmental and somatic dysfunction of thoracic region: Secondary | ICD-10-CM | POA: Diagnosis not present

## 2018-11-08 DIAGNOSIS — M9903 Segmental and somatic dysfunction of lumbar region: Secondary | ICD-10-CM | POA: Diagnosis not present

## 2018-11-08 DIAGNOSIS — M9904 Segmental and somatic dysfunction of sacral region: Secondary | ICD-10-CM | POA: Diagnosis not present

## 2018-11-08 DIAGNOSIS — M5136 Other intervertebral disc degeneration, lumbar region: Secondary | ICD-10-CM | POA: Diagnosis not present

## 2018-11-10 DIAGNOSIS — M5137 Other intervertebral disc degeneration, lumbosacral region: Secondary | ICD-10-CM | POA: Diagnosis not present

## 2018-11-10 DIAGNOSIS — M9904 Segmental and somatic dysfunction of sacral region: Secondary | ICD-10-CM | POA: Diagnosis not present

## 2018-11-10 DIAGNOSIS — M5136 Other intervertebral disc degeneration, lumbar region: Secondary | ICD-10-CM | POA: Diagnosis not present

## 2018-11-10 DIAGNOSIS — M9902 Segmental and somatic dysfunction of thoracic region: Secondary | ICD-10-CM | POA: Diagnosis not present

## 2018-11-10 DIAGNOSIS — M9903 Segmental and somatic dysfunction of lumbar region: Secondary | ICD-10-CM | POA: Diagnosis not present

## 2018-11-10 DIAGNOSIS — M5135 Other intervertebral disc degeneration, thoracolumbar region: Secondary | ICD-10-CM | POA: Diagnosis not present

## 2018-11-14 DIAGNOSIS — M9902 Segmental and somatic dysfunction of thoracic region: Secondary | ICD-10-CM | POA: Diagnosis not present

## 2018-11-14 DIAGNOSIS — M5137 Other intervertebral disc degeneration, lumbosacral region: Secondary | ICD-10-CM | POA: Diagnosis not present

## 2018-11-14 DIAGNOSIS — M9904 Segmental and somatic dysfunction of sacral region: Secondary | ICD-10-CM | POA: Diagnosis not present

## 2018-11-14 DIAGNOSIS — M9903 Segmental and somatic dysfunction of lumbar region: Secondary | ICD-10-CM | POA: Diagnosis not present

## 2018-11-14 DIAGNOSIS — M5136 Other intervertebral disc degeneration, lumbar region: Secondary | ICD-10-CM | POA: Diagnosis not present

## 2018-11-14 DIAGNOSIS — M5135 Other intervertebral disc degeneration, thoracolumbar region: Secondary | ICD-10-CM | POA: Diagnosis not present

## 2018-11-17 DIAGNOSIS — M5136 Other intervertebral disc degeneration, lumbar region: Secondary | ICD-10-CM | POA: Diagnosis not present

## 2018-11-17 DIAGNOSIS — M5135 Other intervertebral disc degeneration, thoracolumbar region: Secondary | ICD-10-CM | POA: Diagnosis not present

## 2018-11-17 DIAGNOSIS — M5137 Other intervertebral disc degeneration, lumbosacral region: Secondary | ICD-10-CM | POA: Diagnosis not present

## 2018-11-17 DIAGNOSIS — M9904 Segmental and somatic dysfunction of sacral region: Secondary | ICD-10-CM | POA: Diagnosis not present

## 2018-11-17 DIAGNOSIS — M9902 Segmental and somatic dysfunction of thoracic region: Secondary | ICD-10-CM | POA: Diagnosis not present

## 2018-11-17 DIAGNOSIS — M9903 Segmental and somatic dysfunction of lumbar region: Secondary | ICD-10-CM | POA: Diagnosis not present

## 2018-11-22 DIAGNOSIS — M5135 Other intervertebral disc degeneration, thoracolumbar region: Secondary | ICD-10-CM | POA: Diagnosis not present

## 2018-11-22 DIAGNOSIS — M5136 Other intervertebral disc degeneration, lumbar region: Secondary | ICD-10-CM | POA: Diagnosis not present

## 2018-11-22 DIAGNOSIS — M9903 Segmental and somatic dysfunction of lumbar region: Secondary | ICD-10-CM | POA: Diagnosis not present

## 2018-11-22 DIAGNOSIS — M9902 Segmental and somatic dysfunction of thoracic region: Secondary | ICD-10-CM | POA: Diagnosis not present

## 2018-11-22 DIAGNOSIS — M5137 Other intervertebral disc degeneration, lumbosacral region: Secondary | ICD-10-CM | POA: Diagnosis not present

## 2018-11-22 DIAGNOSIS — M9904 Segmental and somatic dysfunction of sacral region: Secondary | ICD-10-CM | POA: Diagnosis not present

## 2018-11-24 ENCOUNTER — Ambulatory Visit (HOSPITAL_COMMUNITY)
Admission: RE | Admit: 2018-11-24 | Discharge: 2018-11-24 | Disposition: A | Discharge status: Home / Self Care | Payer: Medicare Other | Source: Ambulatory Visit | Attending: Cardiology | Admitting: Cardiology

## 2018-11-24 ENCOUNTER — Other Ambulatory Visit: Payer: Self-pay

## 2018-11-24 VITALS — BP 162/88 | HR 57 | Wt 196.4 lb

## 2018-11-24 DIAGNOSIS — Z7901 Long term (current) use of anticoagulants: Secondary | ICD-10-CM | POA: Insufficient documentation

## 2018-11-24 DIAGNOSIS — I712 Thoracic aortic aneurysm, without rupture: Secondary | ICD-10-CM | POA: Insufficient documentation

## 2018-11-24 DIAGNOSIS — I480 Paroxysmal atrial fibrillation: Secondary | ICD-10-CM | POA: Insufficient documentation

## 2018-11-24 DIAGNOSIS — Z8249 Family history of ischemic heart disease and other diseases of the circulatory system: Secondary | ICD-10-CM | POA: Insufficient documentation

## 2018-11-24 DIAGNOSIS — Z79899 Other long term (current) drug therapy: Secondary | ICD-10-CM | POA: Diagnosis not present

## 2018-11-24 DIAGNOSIS — M545 Low back pain: Secondary | ICD-10-CM | POA: Diagnosis not present

## 2018-11-24 DIAGNOSIS — M1990 Unspecified osteoarthritis, unspecified site: Secondary | ICD-10-CM | POA: Insufficient documentation

## 2018-11-24 DIAGNOSIS — E7800 Pure hypercholesterolemia, unspecified: Secondary | ICD-10-CM | POA: Diagnosis not present

## 2018-11-24 DIAGNOSIS — I350 Nonrheumatic aortic (valve) stenosis: Secondary | ICD-10-CM | POA: Diagnosis not present

## 2018-11-24 DIAGNOSIS — I10 Essential (primary) hypertension: Secondary | ICD-10-CM | POA: Diagnosis not present

## 2018-11-24 DIAGNOSIS — I2510 Atherosclerotic heart disease of native coronary artery without angina pectoris: Secondary | ICD-10-CM | POA: Insufficient documentation

## 2018-11-24 DIAGNOSIS — Z86010 Personal history of colonic polyps: Secondary | ICD-10-CM | POA: Diagnosis not present

## 2018-11-24 DIAGNOSIS — Q231 Congenital insufficiency of aortic valve: Secondary | ICD-10-CM | POA: Diagnosis not present

## 2018-11-24 NOTE — Patient Instructions (Signed)
You have been referred to [facility] @ our [facility] location, this office will call to schedule an appointment with you.  Your physician recommends that you schedule a follow-up appointment in: 2 MONTHS.

## 2018-11-26 NOTE — Progress Notes (Signed)
Patient ID: [name] [name], male   DOB: 01-02-1941, 78 y.o.   MRN: [id number] PCP: Dr. Alain Marion Cardiology: Dr. Aundra Dubin  78 y.o.with history of HTN and paroxysmal atrial fibrillation returns for followup. He was hospitalized with atrial fibrillation/RVR in 5/14.  He had TEE-guided cardioversion.  TEE showed bicuspid aortic valve with mild AS and a moderately dilated ascending aorta.  Most recent echo in 4/17 showed moderate aortic stenosis and MRA chest in 11/17 showed 4.1 cm ascending aorta.   He was on Xarelto for anticoagulation but stopped it as he was convinced it was causing joint pains.  He then refused to start any other anticoagulation.    In 5/17, he had been back in atrial fibrillation for several days and was symptomatic.  I started him on Eliquis and planned TEE-guided DCCV given significant symptoms, but he converted back to NSR on his own.  He continued Eliquis for about 1 month then stopped it on his own.    In 11/17, he was hospitalized with symptomatic atrial fibrillation with RVR.  I started him on diltiazem CD and Eliquis with plan for TEE-guided DCCV.  However, he converted back to NSR on his own. He stopped the diltiazem but has continued the Eliquis.   He was admitted in 2/18 with fever, LUL PNA and left-sided pleural effusion.  Thoracentesis on left was suggestive of parapneumonic effusion.  He was in the hospital 11 days.  During that time, he went into atrial fibrillation with RVR.  He was started on amiodarone and went back into NSR.  Amiodarone was subsequently stopped.   Recurrent atrial fibrillation with RVR in 2/19, felt more fatigued.  He underwent TEE-guided DCCV back to NSR.   He had a lower GI bleed in 11/19 post-polypectomy.  He has since restarted on Eliquis.   Coronary CTA was done in 2/20, this showed mild nonobstructive CAD.  Echo in 2/20 showed EF 60-65%, bicuspid aortic valve with severe AS.   He returns for followup of atrial fibrillation and aortic  stenosis.  He remains in NSR today. Main complaint is low back pain, he wants to find someone who will operate on his back.  No chest pain.  Poor stamina and fatigues easily, not particularly short of breath.    ECG (personally reviewed): NSR, inferolateral TWIs  Labs (5/14): K 3.7, creatinine 0.9, BNP 2261=>109, TSH normal Labs (7/14): K 3.5, creatinine 1.0 Labs (12/14): K 3.8, creatinine 1.0, LDL particle number 1374, LDL 107, TSH normal Labs (6/16): TSH normal, K 3.8, creatinine 0.84, HCT 42.4, LDL 85, LFTs normal Labs (3/17): K 3.8, creatinine 0.87 Labs (5/17): K 4.5, creatinine 0.97, HCT 43.7 Labs (11/17): K 3.2, creatinine 0.81, LDL 71, HDL 24 Labs (2/18): K 3.8, creatinine 0.89 Labs (2/19): K 3.7, creatinine 0.83, hgb 16 Labs (11/19): LDL 82 Labs (1/20): TSH elevated but free T4 normal, K 4, creatinine 0.85, hgb 13.5 Labs (2/20): TSH mildly elevated but free T4 normal, K 4, creatinine 0.85  Allergies (verified):  No Known Drug Allergies   Past Medical History:  1. Hypertension: ACEI cough.  2. Atrial fibrillation. The patient had new-onset atrial fibrillation in November 2009. He underwent ibutilide cardioversion successfully. He has had 1-2 episodes/year that are short-lived that likely are atrial fibrillation with RVR. He was admitted in 5/14 with atrial fibrillation/RVR and had TEE-guided DCCV.  Atrial fibrillation again in 5/17, converted back to NSR spontaneously.  CHADSVASC score 2.  - Atrial fibrillation 2/19 with TEE-guided DCCV.  3. Hypercholesterolemia.  4.  Bicuspid aortic valve disorder: Echo (7/11): EF 55-60%, mild LV hypertrophy, mild aortic stenosis with mean gradient 19 mmHg and peak gradient 36 mmHg.  TEE (5/14): EF 55%, mild LVH, bicuspid aortic valve with mild AS (mean gradient 13 mmHg), ascending aorta 4.5 cm.  Echo (9/15) with EF 65-70%, moderate AS (mean gradient 23 mmHg), mild AI, ascending aorta 4.1 cm, mild MR.   - Echo (4/17) with EF 65-70%, moderate aortic  stenosis with mean gradient 27 mmHg, PASP 31 mmHg, ascending aorta 4.4 cm.  - Echo (2/18) with EF 55-60%, bicuspid aortic valve with moderate aortic stenosis (underestimated gradient).  - TEE (2/19): EF 60-65%, moderate LVH, bicuspid aortic valve with moderate AS with mean gradient 28 mmHg and AVA 1.2 cm^2, 4.4 cm ascending aorta.  - Echo (2/20): EF 60-65%, mild LVH, bicuspid aortic valve with severe AS (mean gradient 48 mmHg, AVA 0.8 cm^2).  5. Osteoarthritis.  6. Low back pain.  7. Chest pain: ETT-myoview (12/11) with 10:51 exercise, no chest pain, no significant ST changes, EF 69%, no evidence for ischemia or infarction.  - Coronary CTA (2/20): calcium score 41, nonobstructive CAD.  8. Ascending aortic aneurysm: Associated with bicuspid aortic valve.  4.5 cm by TEE in 5/14.   MRA chest (6/14) with bicuspid aortic valve, 4.3 cm ascending aortic aneurysm.  MRA chest (10/15) with 4.2 cm ascending aorta (bicuspid aortic valve noted).  Echo (4/17) with ascending aorta diameter 4.4 cm.  - MRA chest (11/17) with 4.1 cm ascending aorta.  - Echo (2/19): 4.4 cm ascending aorta.  - Coronary CTA in 2/20 showed 4.3 cm ascending aorta.  9. Colonic polyps: GI bleeding in 11/19 s/p polypectomy.   Family History:  Hypertension. No early CAD.   Social History:  The patient lives in [place]. He is a native of [place]. He is retired Chief Financial Officer at [employer]. He drinks a glass of wine nightly. He is a nonsmoker. He is a former Microbiologist.   Review of Systems  All systems reviewed and negative except as per HPI.   Current Outpatient Medications  Medication Sig Dispense Refill  . apixaban (ELIQUIS) 5 MG TABS tablet Take 1 tablet (5 mg total) by mouth 2 (two) times daily. 60 tablet 6  . metoprolol succinate (TOPROL-XL) 50 MG 24 hr tablet TAKE 1 TABLET (50 MG TOTAL) BY MOUTH 2 (TWO) TIMES DAILY. TAKE WITH OR IMMEDIATELY FOLLOWING A MEAL. (Patient taking differently: Take 50 mg by mouth 2 (two)  times daily. ) 180 tablet 3   No current facility-administered medications for this encounter.     BP (!) 162/88   Pulse (!) 57   Wt 89.1 kg (196 lb 6.4 oz)   SpO2 96%   BMI 28.18 kg/m  General: NAD Neck: No JVD, no thyromegaly or thyroid nodule.  Lungs: Clear to auscultation bilaterally with normal respiratory effort. CV: Nondisplaced PMI.  Heart regular S1/S2, no S3/S4, 3/6 SEM RUSB with muffled S2.  No peripheral edema.  No carotid bruit.  Normal pedal pulses.  Abdomen: Soft, nontender, no hepatosplenomegaly, no distention.  Skin: Intact without lesions or rashes.  Neurologic: Alert and oriented x 3.  Psych: Normal affect. Extremities: No clubbing or cyanosis.  HEENT: Normal.   Assessment/Plan: 1. Atrial fibrillation: Paroxysmal. He is quite symptomatic when in atrial fibrillation.  CHADSVASC = 3.  He is in NSR.   - He does not want to take an anti-arrhythmic medication.   - Continue apixaban 5 mg bid.    -  He has not wanted atrial fibrillation ablation.  - Continue Toprol XL.  2. HTN: BP high today but resistant to additional meds. Continue Toprol XL.  3. Bicuspid aortic valve disorder: Aortic stenosis has progressed to severe.  I think he is symptomatic, though his main complaint is low back pain, he has significant exertion fatigue.  Ascending aorta was 4.3 cm on recent coronary CTA.  - Generally, he is very reticent to have procedures done but he agrees to TAVR consultation with Dr. Angelena Form.  He had a recent coronary CTA with nonobstructive disease.  He may find it easier to agree to have TAVR done if we can defer invasive angiography.  4. Low back pain: This is his main complaint.  I told him that he would be unlikely to be able to have a back operation until his aortic stenosis is corrected.    Followup in 2 months (refer for TAVR evaluation).    [name] 11/26/2018

## 2018-11-29 DIAGNOSIS — M5135 Other intervertebral disc degeneration, thoracolumbar region: Secondary | ICD-10-CM | POA: Diagnosis not present

## 2018-11-29 DIAGNOSIS — M5136 Other intervertebral disc degeneration, lumbar region: Secondary | ICD-10-CM | POA: Diagnosis not present

## 2018-11-29 DIAGNOSIS — M9904 Segmental and somatic dysfunction of sacral region: Secondary | ICD-10-CM | POA: Diagnosis not present

## 2018-11-29 DIAGNOSIS — M9903 Segmental and somatic dysfunction of lumbar region: Secondary | ICD-10-CM | POA: Diagnosis not present

## 2018-11-29 DIAGNOSIS — M5137 Other intervertebral disc degeneration, lumbosacral region: Secondary | ICD-10-CM | POA: Diagnosis not present

## 2018-11-29 DIAGNOSIS — M9902 Segmental and somatic dysfunction of thoracic region: Secondary | ICD-10-CM | POA: Diagnosis not present

## 2018-12-07 ENCOUNTER — Other Ambulatory Visit (HOSPITAL_COMMUNITY): Payer: Self-pay | Admitting: Student

## 2018-12-11 ENCOUNTER — Other Ambulatory Visit: Payer: Self-pay

## 2018-12-11 ENCOUNTER — Encounter: Payer: Self-pay | Admitting: Internal Medicine

## 2018-12-11 ENCOUNTER — Other Ambulatory Visit: Payer: Self-pay | Admitting: Internal Medicine

## 2018-12-11 ENCOUNTER — Ambulatory Visit (INDEPENDENT_AMBULATORY_CARE_PROVIDER_SITE_OTHER)
Admission: RE | Admit: 2018-12-11 | Discharge: 2018-12-11 | Disposition: A | Discharge status: Home / Self Care | Payer: Medicare Other | Source: Ambulatory Visit | Attending: Internal Medicine | Admitting: Internal Medicine

## 2018-12-11 ENCOUNTER — Ambulatory Visit (INDEPENDENT_AMBULATORY_CARE_PROVIDER_SITE_OTHER): Payer: Medicare Other | Admitting: Internal Medicine

## 2018-12-11 DIAGNOSIS — R109 Unspecified abdominal pain: Secondary | ICD-10-CM | POA: Diagnosis not present

## 2018-12-11 DIAGNOSIS — I480 Paroxysmal atrial fibrillation: Secondary | ICD-10-CM | POA: Diagnosis not present

## 2018-12-11 DIAGNOSIS — I359 Nonrheumatic aortic valve disorder, unspecified: Secondary | ICD-10-CM

## 2018-12-11 DIAGNOSIS — I10 Essential (primary) hypertension: Secondary | ICD-10-CM | POA: Diagnosis not present

## 2018-12-11 DIAGNOSIS — R0781 Pleurodynia: Secondary | ICD-10-CM | POA: Diagnosis not present

## 2018-12-11 MED ORDER — LORATADINE 10 MG PO TABS: 10.0000 mg | ORAL_TABLET | Freq: Every day | ORAL | 11 refills | Status: DC

## 2018-12-11 MED ORDER — MOMETASONE FUROATE 50 MCG/ACT NA SUSP: 2.0000 | Freq: Every day | NASAL | 5 refills | Status: DC

## 2018-12-11 MED ORDER — HYDROCODONE-ACETAMINOPHEN 5-325 MG PO TABS: 0.5000 | ORAL_TABLET | Freq: Four times a day (QID) | ORAL | 0 refills | Status: DC | PRN

## 2018-12-11 NOTE — Assessment & Plan Note (Signed)
BP Readings from Last 3 Encounters:  12/11/18 132/84  11/24/18 (!) 162/88  10/24/18 129/70

## 2018-12-11 NOTE — Assessment & Plan Note (Signed)
severe AS He agrees to TAVR consultation with Dr. Angelena Form.  He had a recent coronary CTA with nonobstructive disease.  He may find it easier to agree to have TAVR done if we can defer invasive angiography.

## 2018-12-11 NOTE — Assessment & Plan Note (Signed)
R lower posterior ribs are tender Ribs X ray  Norco prn  Potential benefits of a short or long term opioids use as well as potential risks (i.e. addiction risk, apnea etc) and complications (i.e. Somnolence, constipation and others) were explained to the patient and were aknowledged.

## 2018-12-11 NOTE — Assessment & Plan Note (Signed)
Eliquis Toprol

## 2018-12-11 NOTE — Progress Notes (Signed)
Subjective:  Patient ID: [name] [name], male    DOB: 1941/09/07  Age: 78 y.o. MRN: [id number]  CC: No chief complaint on file.   HPI [name] [name] presents for HTN, PAF, LBP, severe AS  Outpatient Medications Prior to Visit  Medication Sig Dispense Refill  . apixaban (ELIQUIS) 5 MG TABS tablet Take 1 tablet (5 mg total) by mouth 2 (two) times daily. 60 tablet 6  . hydrochlorothiazide (MICROZIDE) 12.5 MG capsule Take 12.5 mg by mouth daily.    . metoprolol succinate (TOPROL-XL) 50 MG 24 hr tablet TAKE 1 TABLET (50 MG TOTAL) BY MOUTH 2 (TWO) TIMES DAILY. TAKE WITH OR IMMEDIATELY FOLLOWING A MEAL. (Patient taking differently: Take 50 mg by mouth 2 (two) times daily. ) 180 tablet 3   No facility-administered medications prior to visit.     ROS: Review of Systems  Constitutional: Positive for fatigue. Negative for appetite change and unexpected weight change.  HENT: Negative for congestion, nosebleeds, sneezing, sore throat and trouble swallowing.   Eyes: Negative for itching and visual disturbance.  Respiratory: Positive for shortness of breath. Negative for cough.   Cardiovascular: Negative for chest pain, palpitations and leg swelling.  Gastrointestinal: Negative for abdominal distention, blood in stool, diarrhea and nausea.  Genitourinary: Negative for frequency and hematuria.  Musculoskeletal: Positive for back pain. Negative for gait problem, joint swelling and neck pain.  Skin: Negative for rash.  Neurological: Negative for dizziness, tremors, speech difficulty and weakness.  Psychiatric/Behavioral: Negative for agitation, dysphoric mood and sleep disturbance. The patient is not nervous/anxious.     Objective:  BP 132/84 (BP Location: Left Arm, Patient Position: Sitting, Cuff Size: Normal)   Pulse (!) 55   Temp 98 F (36.7 C) (Oral)   Ht 5\' 10"  (1.778 m)   Wt 194 lb (88 kg)   SpO2 98%   BMI 27.84 kg/m   BP Readings from Last 3 Encounters:  12/11/18 132/84  11/24/18  (!) 162/88  10/24/18 129/70    Wt Readings from Last 3 Encounters:  12/11/18 194 lb (88 kg)  11/24/18 196 lb 6.4 oz (89.1 kg)  10/13/18 191 lb (86.6 kg)    Physical Exam Constitutional:      General: He is not in acute distress.    Appearance: He is well-developed.     Comments: NAD  Eyes:     Conjunctiva/sclera: Conjunctivae normal.     Pupils: Pupils are equal, round, and reactive to light.  Neck:     Musculoskeletal: Normal range of motion.     Thyroid: No thyromegaly.     Vascular: No JVD.  Cardiovascular:     Rate and Rhythm: Normal rate and regular rhythm.     Heart sounds: Murmur present. No friction rub. No gallop.   Pulmonary:     Effort: Pulmonary effort is normal. No respiratory distress.     Breath sounds: Normal breath sounds. No wheezing or rales.  Chest:     Chest wall: No tenderness.  Abdominal:     General: Bowel sounds are normal. There is no distension.     Palpations: Abdomen is soft. There is no mass.     Tenderness: There is no abdominal tenderness. There is no guarding or rebound.  Musculoskeletal: Normal range of motion.        General: No tenderness.  Lymphadenopathy:     Cervical: No cervical adenopathy.  Skin:    General: Skin is warm and dry.     Findings: No rash.  Neurological:     Mental Status: He is alert and oriented to person, place, and time.     Cranial Nerves: No cranial nerve deficit.     Motor: No abnormal muscle tone.     Coordination: Coordination normal.     Gait: Gait normal.     Deep Tendon Reflexes: Reflexes are normal and symmetric.  Psychiatric:        Behavior: Behavior normal.        Thought Content: Thought content normal.        Judgment: Judgment normal.   LS spine, R lower posterior ribs are tender  Lab Results  Component Value Date   WBC 5.5 10/03/2018   HGB 13.5 10/03/2018   HCT 39.9 10/03/2018   PLT 214.0 10/03/2018   GLUCOSE 93 10/03/2018   CHOL 144 08/01/2018   TRIG 167.0 (H) 08/01/2018   HDL  28.20 (L) 08/01/2018   LDLDIRECT 151.5 11/20/2007   LDLCALC 82 08/01/2018   ALT 19 10/28/2017   AST 26 10/28/2017   NA 139 10/03/2018   K 4.0 10/03/2018   CL 101 10/03/2018   CREATININE 0.85 10/03/2018   BUN 15 10/03/2018   CO2 31 10/03/2018   TSH 4.76 (H) 10/03/2018   PSA 4.20 (H) 08/01/2018   INR 1.15 07/21/2018   HGBA1C 5.7 (H) 08/02/2016    No results found.  Assessment & Plan:   There are no diagnoses linked to this encounter.   No orders of the defined types were placed in this encounter.    Follow-up: No follow-ups on file.  [name], MD

## 2018-12-12 NOTE — Telephone Encounter (Signed)
mometasone (NASONEX) 50 MCG/ACT nasal spray not covered by insurance, alternative loaded

## 2018-12-14 MED ORDER — FLUNISOLIDE 25 MCG/ACT (0.025%) NA SOLN: 2.0000 | Freq: Every day | NASAL | 3 refills | Status: DC

## 2018-12-14 NOTE — Addendum Note (Signed)
Addended by: Karren Cobble on: 12/14/2018 10:52 AM   Modules accepted: Orders

## 2019-01-10 ENCOUNTER — Other Ambulatory Visit: Payer: Self-pay | Admitting: Cardiology

## 2019-01-10 DIAGNOSIS — I359 Nonrheumatic aortic valve disorder, unspecified: Secondary | ICD-10-CM

## 2019-01-10 DIAGNOSIS — I480 Paroxysmal atrial fibrillation: Secondary | ICD-10-CM

## 2019-01-15 ENCOUNTER — Other Ambulatory Visit: Payer: Self-pay | Admitting: Internal Medicine

## 2019-01-15 ENCOUNTER — Ambulatory Visit: Payer: Self-pay

## 2019-01-15 DIAGNOSIS — I480 Paroxysmal atrial fibrillation: Secondary | ICD-10-CM

## 2019-01-15 DIAGNOSIS — M2550 Pain in unspecified joint: Secondary | ICD-10-CM

## 2019-01-15 NOTE — Telephone Encounter (Signed)
Incoming call from Patient requesting to be tested for Covid-19.  Patient  reports leg and elbows aching .  Reports weakness, and SOB.  Related to Patient that we are no longer testing for Covid-19  At this time.  Patient voiced complaint.        Attempted to provide [facility] Department phone number.  Patient disconneted phone call.

## 2019-01-19 ENCOUNTER — Other Ambulatory Visit (INDEPENDENT_AMBULATORY_CARE_PROVIDER_SITE_OTHER): Payer: Medicare Other

## 2019-01-19 DIAGNOSIS — I480 Paroxysmal atrial fibrillation: Secondary | ICD-10-CM | POA: Diagnosis not present

## 2019-01-19 DIAGNOSIS — M2550 Pain in unspecified joint: Secondary | ICD-10-CM | POA: Diagnosis not present

## 2019-01-19 LAB — SEDIMENTATION RATE: Sed Rate: 48 mm/hr — ABNORMAL HIGH (ref 0–20)

## 2019-01-20 LAB — RHEUMATOID FACTOR: Rheumatoid fact SerPl-aCnc: <14 IU/mL (ref <14)

## 2019-01-29 ENCOUNTER — Encounter: Payer: Self-pay | Admitting: Internal Medicine

## 2019-01-29 ENCOUNTER — Ambulatory Visit (INDEPENDENT_AMBULATORY_CARE_PROVIDER_SITE_OTHER): Payer: Medicare Other | Admitting: Internal Medicine

## 2019-01-29 ENCOUNTER — Other Ambulatory Visit: Payer: Self-pay

## 2019-01-29 DIAGNOSIS — M353 Polymyalgia rheumatica: Secondary | ICD-10-CM | POA: Diagnosis not present

## 2019-01-29 DIAGNOSIS — G8929 Other chronic pain: Secondary | ICD-10-CM

## 2019-01-29 DIAGNOSIS — M545 Low back pain, unspecified: Secondary | ICD-10-CM

## 2019-01-29 MED ORDER — FAMOTIDINE 40 MG PO TABS: 40.0000 mg | ORAL_TABLET | Freq: Every day | ORAL | 11 refills | Status: DC

## 2019-01-29 MED ORDER — HYDROCODONE-ACETAMINOPHEN 5-325 MG PO TABS: 1.0000 | ORAL_TABLET | Freq: Four times a day (QID) | ORAL | 0 refills | Status: DC | PRN

## 2019-01-29 MED ORDER — PREDNISONE 5 MG PO TABS: 15.0000 mg | ORAL_TABLET | Freq: Every day | ORAL | 3 refills | Status: DC

## 2019-01-29 NOTE — Assessment & Plan Note (Addendum)
?  PMR Deltasone 15 mg/d and Pepcid  Potential benefits of a long term steroid  use as well as potential risks  and complications were explained to the patient and were aknowledged.

## 2019-01-29 NOTE — Patient Instructions (Signed)
Polymyalgia Rheumatica  Polymyalgia rheumatica (PMR) is an inflammatory disorder that causes aching and stiffness in your muscles and joints. Sometimes, PMR leads to a more dangerous condition (temporal arteritis or giant cell arteritis), which can cause vision loss.  What are the causes?  The exact cause of PMR is not known.  What increases the risk?  This condition is more likely to develop in:  · Females.  · People who are 79 years of age or older.  · Caucasians.  What are the signs or symptoms?  Pain and stiffness are the main symptoms of PMR. Symptoms may start slowly or suddenly. The symptoms:  · May be worse after inactivity and in the morning.  · May affect your:  ? Hips, buttocks, and thighs.  ? Neck, arms, and shoulders. This can make it hard to raise your arms above your head.  ? Hands and wrists.  Other symptoms include:  · Fever.  · Tiredness.  · Weakness.  · Decreased appetite. This may lead to weight loss.  How is this diagnosed?  This condition is diagnosed with a medical history and physical exam. You may need to see a health care provider who specializes in diseases of the joint, muscles, and bones (rheumatologist). You may also have tests, including:  · Blood tests.  · X-rays.  How is this treated?  PMR usually goes away without treatment, but it may take years for that to happen. In the meantime, your health care provider may recommend low-dose steroids to help manage your symptoms of pain and stiffness. Regular exercise and rest will also help your symptoms.  Follow these instructions at home:  · Take over-the-counter and prescription medicines only as told by your health care provider.  · Make sure to get enough rest and sleep.  · Eat a healthy and nutritious diet.  · Try to exercise most days of the week. Ask your health care provider what type of exercise is best for you.  · Keep all follow-up visits as told by your health are provider. This is important.  Contact a health care provider  if:  · Your symptoms are not controlled with medicine.  · You have side effects from steroids. These may include:  ? Weight gain.  ? Swelling.  ? Insomnia.  ? Mood changes.  ? Bruising.  ? High blood sugar readings, if you have diabetes.  ? Higher than normal blood pressure readings, if you monitor your blood pressure.  Get help right away if:  · You develop symptoms of temporal arteritis, such as:  ? A change in vision.  ? Severe headache.  ? Scalp pain.  ? Jaw pain.  This information is not intended to replace advice given to you by your health care provider. Make sure you discuss any questions you have with your health care provider.  Document Released: 10/07/2004 Document Revised: 02/05/2016 Document Reviewed: 03/12/2015  Elsevier Interactive Patient Education © 2019 [employer]

## 2019-01-29 NOTE — Progress Notes (Signed)
Subjective:  Patient ID: [name] [name], male    DOB: Jul 05, 1941  Age: 78 y.o. MRN: [id number]  CC: No chief complaint on file.   HPI [name] [name] presents for severe aches and pains in the shoulders and hips x 2 weeks, wt loss, weakness, stiffness in am  Outpatient Medications Prior to Visit  Medication Sig Dispense Refill  . apixaban (ELIQUIS) 5 MG TABS tablet Take 1 tablet (5 mg total) by mouth 2 (two) times daily. 60 tablet 6  . flunisolide (NASALIDE) 25 MCG/ACT (0.025%) SOLN Place 2 sprays into the nose daily. 25 mL 3  . hydrochlorothiazide (MICROZIDE) 12.5 MG capsule Take 12.5 mg by mouth daily.    [name] [name] HYDROcodone-acetaminophen (NORCO/VICODIN) 5-325 MG tablet Take 0.5-1 tablets by mouth every 6 (six) hours as needed for severe pain. 60 tablet 0  . loratadine (CLARITIN) 10 MG tablet Take 1 tablet (10 mg total) by mouth daily. 30 tablet 11  . metoprolol succinate (TOPROL-XL) 50 MG 24 hr tablet TAKE 1 TABLET (50 MG TOTAL) BY MOUTH 2 (TWO) TIMES DAILY. TAKE WITH OR IMMEDIATELY FOLLOWING A MEAL. 180 tablet 3   No facility-administered medications prior to visit.     ROS: Review of Systems  Constitutional: Positive for fatigue and unexpected weight change. Negative for appetite change.  HENT: Negative for congestion, nosebleeds, sneezing, sore throat and trouble swallowing.   Eyes: Negative for itching and visual disturbance.  Respiratory: Negative for cough.   Cardiovascular: Negative for chest pain, palpitations and leg swelling.  Gastrointestinal: Negative for abdominal distention, blood in stool, diarrhea and nausea.  Genitourinary: Negative for frequency and hematuria.  Musculoskeletal: Positive for arthralgias, back pain, gait problem, myalgias, neck pain and neck stiffness. Negative for joint swelling.  Skin: Negative for rash.  Neurological: Positive for weakness. Negative for dizziness, tremors and speech difficulty.  Psychiatric/Behavioral: Positive for sleep disturbance.  Negative for agitation, dysphoric mood and suicidal ideas. The patient is nervous/anxious.     Objective:  BP (!) 154/86 (BP Location: Left Arm, Patient Position: Sitting, Cuff Size: Normal)   Pulse 87   Temp 97.8 F (36.6 C) (Oral)   Ht 5\' 10"  (1.778 m)   Wt 188 lb (85.3 kg)   SpO2 96%   BMI 26.98 kg/m   BP Readings from Last 3 Encounters:  01/29/19 (!) 154/86  12/11/18 132/84  11/24/18 (!) 162/88    Wt Readings from Last 3 Encounters:  01/29/19 188 lb (85.3 kg)  12/11/18 194 lb (88 kg)  11/24/18 196 lb 6.4 oz (89.1 kg)    Physical Exam Constitutional:      General: He is not in acute distress.    Appearance: He is well-developed.     Comments: NAD  Eyes:     Conjunctiva/sclera: Conjunctivae normal.     Pupils: Pupils are equal, round, and reactive to light.  Neck:     Musculoskeletal: Normal range of motion.     Thyroid: No thyromegaly.     Vascular: No JVD.  Cardiovascular:     Rate and Rhythm: Normal rate and regular rhythm.     Heart sounds: Normal heart sounds. No murmur. No friction rub. No gallop.   Pulmonary:     Effort: Pulmonary effort is normal. No respiratory distress.     Breath sounds: Normal breath sounds. No wheezing or rales.  Chest:     Chest wall: No tenderness.  Abdominal:     General: Bowel sounds are normal. There is no distension.  Palpations: Abdomen is soft. There is no mass.     Tenderness: There is no abdominal tenderness. There is no guarding or rebound.  Musculoskeletal: Normal range of motion.        General: Tenderness present.  Lymphadenopathy:     Cervical: No cervical adenopathy.  Skin:    General: Skin is warm and dry.     Findings: No rash.  Neurological:     Mental Status: He is alert and oriented to person, place, and time.     Cranial Nerves: No cranial nerve deficit.     Motor: Weakness present. No abnormal muscle tone.     Coordination: Coordination abnormal.     Gait: Gait abnormal.     Deep Tendon Reflexes:  Reflexes are normal and symmetric.  Psychiatric:        Behavior: Behavior normal.        Thought Content: Thought content normal.        Judgment: Judgment normal.   stiff and painful - hips, shouldes Cane frustrated  Lab Results  Component Value Date   WBC 5.5 10/03/2018   HGB 13.5 10/03/2018   HCT 39.9 10/03/2018   PLT 214.0 10/03/2018   GLUCOSE 93 10/03/2018   CHOL 144 08/01/2018   TRIG 167.0 (H) 08/01/2018   HDL 28.20 (L) 08/01/2018   LDLDIRECT 151.5 11/20/2007   LDLCALC 82 08/01/2018   ALT 19 10/28/2017   AST 26 10/28/2017   NA 139 10/03/2018   K 4.0 10/03/2018   CL 101 10/03/2018   CREATININE 0.85 10/03/2018   BUN 15 10/03/2018   CO2 31 10/03/2018   TSH 4.76 (H) 10/03/2018   PSA 4.20 (H) 08/01/2018   INR 1.15 07/21/2018   HGBA1C 5.7 (H) 08/02/2016    Dg Ribs Unilateral Right  Result Date: 12/11/2018 CLINICAL DATA:  RIGHT posterior lower rib pain for 2 months, no known injury EXAM: RIGHT RIBS - 2 VIEW COMPARISON:  CT chest 10/24/2018 FINDINGS: Osseous demineralization. BB placed at site of symptoms lower RIGHT chest. No rib fracture or bone destruction identified. IMPRESSION: No acute osseous abnormalities. Electronically Signed   By: [name] [name] M.D.   On: 12/11/2018 12:41    Assessment & Plan:   There are no diagnoses linked to this encounter.   No orders of the defined types were placed in this encounter.    Follow-up: No follow-ups on file.  [name], MD

## 2019-01-29 NOTE — Assessment & Plan Note (Signed)
Norco prn 

## 2019-02-01 ENCOUNTER — Other Ambulatory Visit: Payer: Self-pay

## 2019-02-01 ENCOUNTER — Ambulatory Visit (HOSPITAL_COMMUNITY)
Admission: RE | Admit: 2019-02-01 | Discharge: 2019-02-01 | Disposition: A | Discharge status: Home / Self Care | Payer: Medicare Other | Source: Ambulatory Visit | Attending: Cardiology | Admitting: Cardiology

## 2019-02-01 ENCOUNTER — Encounter (HOSPITAL_COMMUNITY): Payer: Self-pay

## 2019-02-01 DIAGNOSIS — I350 Nonrheumatic aortic (valve) stenosis: Secondary | ICD-10-CM

## 2019-02-01 DIAGNOSIS — I480 Paroxysmal atrial fibrillation: Secondary | ICD-10-CM

## 2019-02-01 NOTE — Progress Notes (Signed)
Spoke to pt. Pt agreeable to referral and f/u. avs sent via mychart.

## 2019-02-01 NOTE — Patient Instructions (Signed)
You have been referred to Dr. Angelena Form for a TAVR. His office will be in touch with you to schedule an appointment. Lab work will be done at that time.  Please follow up with Dr. Aundra Dubin in 3 months. This is scheduled for

## 2019-02-01 NOTE — Progress Notes (Signed)
Heart Failure TeleHealth Note  Due to national recommendations of social distancing due to [place] 19, Audio/video telehealth visit is felt to be most appropriate for this patient at this time.  See MyChart message from today for patient consent regarding telehealth for [facility]  Date:  02/01/2019   ID:  [name] [name], DOB Nov 09, 1940, MRN [id number]  Location: Home  Provider location: [place] [facility] Failure Type of Visit: Established patient   PCP:  [name], [name], MD  Cardiologist: Dr. Aundra Dubin  Chief Complaint: Shortness of breath   History of Present Illness: [name] [name] is a 78 y.o. male who presents via audio/video conferencing for a telehealth visit today.     he denies symptoms worrisome for COVID 19.   Patient has history of HTN, aortic stenosis and paroxysmal atrial fibrillation. He was hospitalized with atrial fibrillation/RVR in 5/14.  He had TEE-guided cardioversion.  TEE showed bicuspid aortic valve with mild AS and a moderately dilated ascending aorta.  Most echo in 4/17 showed moderate aortic stenosis and MRA chest in 11/17 showed 4.1 cm ascending aorta.   He was on Xarelto for anticoagulation but stopped it as he was convinced it was causing joint pains.  He then refused to start any other anticoagulation at that time.    In 5/17, he had been back in atrial fibrillation for several days and was symptomatic.  I started him on Eliquis and planned TEE-guided DCCV given significant symptoms, but he converted back to NSR on his own.  He continued Eliquis for about 1 month then stopped it on his own.    In 11/17, he was hospitalized with symptomatic atrial fibrillation with RVR.  I started him on diltiazem CD and Eliquis with plan for TEE-guided DCCV.  However, he converted back to NSR on his own. He stopped the diltiazem but has continued the Eliquis.   He was admitted in 2/18 with fever, LUL PNA and left-sided pleural effusion.  Thoracentesis on  left was suggestive of parapneumonic effusion.  He was in the hospital 11 days.  During that time, he went into atrial fibrillation with RVR.  He was started on amiodarone and went back into NSR.  Amiodarone was subsequently stopped.   Recurrent atrial fibrillation with RVR in 2/19, felt more fatigued.  He underwent TEE-guided DCCV back to NSR.   He had a lower GI bleed in 11/19 post-polypectomy.  He has since restarted on Eliquis.   Coronary CTA was done in 2/20, this showed mild nonobstructive CAD.  Echo in 2/20 showed EF 60-65%, bicuspid aortic valve with severe AS.   Patient has exertional dyspnea with a moderate level of exertion (pulling garbage cans, walking fast).  He is easily fatigued.  No palpitations.  No BRBPR/melena.  He has had shoulder girdle pain recently and was started on prednisone for possible PMR.  He says that it has helped some but not markedly.   Labs (5/14): K 3.7, creatinine 0.9, BNP 2261=>109, TSH normal Labs (7/14): K 3.5, creatinine 1.0 Labs (12/14): K 3.8, creatinine 1.0, LDL particle number 1374, LDL 107, TSH normal Labs (6/16): TSH normal, K 3.8, creatinine 0.84, HCT 42.4, LDL 85, LFTs normal Labs (3/17): K 3.8, creatinine 0.87 Labs (5/17): K 4.5, creatinine 0.97, HCT 43.7 Labs (11/17): K 3.2, creatinine 0.81, LDL 71, HDL 24 Labs (2/18): K 3.8, creatinine 0.89 Labs (2/19): K 3.7, creatinine 0.83, hgb 16 Labs (11/19): LDL 82 Labs (1/20): TSH elevated but free T4 normal, K 4,  creatinine 0.85, hgb 13.5 Labs (2/20): TSH mildly elevated but free T4 normal, K 4, creatinine 0.85 Labs (5/20): ESR 48  Allergies (verified):  No Known Drug Allergies   Past Medical History:  1. Hypertension: ACEI cough.  2. Atrial fibrillation. The patient had new-onset atrial fibrillation in November 2009. He underwent ibutilide cardioversion successfully. He has had 1-2 episodes/year that are short-lived that likely are atrial fibrillation with RVR. He was admitted in 5/14  with atrial fibrillation/RVR and had TEE-guided DCCV.  Atrial fibrillation again in 5/17, converted back to NSR spontaneously.  CHADSVASC score 2.  - Atrial fibrillation 2/19 with TEE-guided DCCV.  3. Hypercholesterolemia.  4. Bicuspid aortic valve disorder: Echo (7/11): EF 55-60%, mild LV hypertrophy, mild aortic stenosis with mean gradient 19 mmHg and peak gradient 36 mmHg.  TEE (5/14): EF 55%, mild LVH, bicuspid aortic valve with mild AS (mean gradient 13 mmHg), ascending aorta 4.5 cm.  Echo (9/15) with EF 65-70%, moderate AS (mean gradient 23 mmHg), mild AI, ascending aorta 4.1 cm, mild MR.   - Echo (4/17) with EF 65-70%, moderate aortic stenosis with mean gradient 27 mmHg, PASP 31 mmHg, ascending aorta 4.4 cm.  - Echo (2/18) with EF 55-60%, bicuspid aortic valve with moderate aortic stenosis (underestimated gradient).  - TEE (2/19): EF 60-65%, moderate LVH, bicuspid aortic valve with moderate AS with mean gradient 28 mmHg and AVA 1.2 cm^2, 4.4 cm ascending aorta.  - Echo (2/20): EF 60-65%, mild LVH, bicuspid aortic valve with severe AS (mean gradient 48 mmHg, AVA 0.8 cm^2).  5. Osteoarthritis.  6. Low back pain.  7. Chest pain: ETT-myoview (12/11) with 10:51 exercise, no chest pain, no significant ST changes, EF 69%, no evidence for ischemia or infarction.  - Coronary CTA (2/20): calcium score 41, nonobstructive CAD.  8. Ascending aortic aneurysm: Associated with bicuspid aortic valve.  4.5 cm by TEE in 5/14.   MRA chest (6/14) with bicuspid aortic valve, 4.3 cm ascending aortic aneurysm.  MRA chest (10/15) with 4.2 cm ascending aorta (bicuspid aortic valve noted).  Echo (4/17) with ascending aorta diameter 4.4 cm.  - MRA chest (11/17) with 4.1 cm ascending aorta.  - Echo (2/19): 4.4 cm ascending aorta.  - Coronary CTA in 2/20 showed 4.3 cm ascending aorta.  9. Colonic polyps: GI bleeding in 11/19 s/p polypectomy.    Current Outpatient Medications  Medication Sig Dispense Refill  .  apixaban (ELIQUIS) 5 MG TABS tablet Take 1 tablet (5 mg total) by mouth 2 (two) times daily. 60 tablet 6  . famotidine (PEPCID) 40 MG tablet Take 1 tablet (40 mg total) by mouth daily. 30 tablet 11  . flunisolide (NASALIDE) 25 MCG/ACT (0.025%) SOLN Place 2 sprays into the nose daily. 25 mL 3  . hydrochlorothiazide (MICROZIDE) 12.5 MG capsule Take 12.5 mg by mouth daily.    [name] HYDROcodone-acetaminophen (NORCO/VICODIN) 5-325 MG tablet Take 1 tablet by mouth every 6 (six) hours as needed for severe pain. 60 tablet 0  . loratadine (CLARITIN) 10 MG tablet Take 1 tablet (10 mg total) by mouth daily. 30 tablet 11  . metoprolol succinate (TOPROL-XL) 50 MG 24 hr tablet TAKE 1 TABLET (50 MG TOTAL) BY MOUTH 2 (TWO) TIMES DAILY. TAKE WITH OR IMMEDIATELY FOLLOWING A MEAL. 180 tablet 3  . predniSONE (DELTASONE) 5 MG tablet Take 3 tablets (15 mg total) by mouth daily with breakfast. 90 tablet 3   No current facility-administered medications for this encounter.     Allergies:   Xarelto [rivaroxaban];  Corticosteroids; Ramipril; and Benazepril   Social History:  The patient  reports that he has never smoked. He has never used smokeless tobacco. He reports current alcohol use. He reports that he does not use drugs.   Family History:  The patient's family history includes Colon cancer (age of onset: 69) in his mother; Hypertension in an other family member.   ROS:  Please see the history of present illness.   All other systems are personally reviewed and negative.   Exam:  (Video/Tele Health Call; Exam is subjective and or/visual.) BP 132/78, HR 62 General:  Speaks in full sentences. No resp difficulty. Lungs: Normal respiratory effort with conversation.  Abdomen: Non-distended per patient report Extremities: Pt denies edema. Neuro: Alert & oriented x 3.   Recent Labs: 02/28/2018: B Natriuretic Peptide 644.5 07/26/2018: Magnesium 1.8 10/03/2018: BUN 15; Creatinine, Ser 0.85; Hemoglobin 13.5; Platelets 214.0;  Potassium 4.0; Sodium 139; TSH 4.76  Personally reviewed   Wt Readings from Last 3 Encounters:  01/29/19 85.3 kg (188 lb)  12/11/18 88 kg (194 lb)  11/24/18 89.1 kg (196 lb 6.4 oz)      ASSESSMENT AND PLAN:  1. Atrial fibrillation: Paroxysmal. He is quite symptomatic when in atrial fibrillation.  CHADSVASC = 3.  He has not had recent palpitations.   - He does not want to take an anti-arrhythmic medication.   - Continue apixaban 5 mg bid, arrange for CBC.  - He has not wanted atrial fibrillation ablation.  - Continue Toprol XL.  2. HTN: BP controlled on home readings.  3. Bicuspid aortic valve disorder: Aortic stenosis has progressed to severe.  He has symptomatic exertional dyspnea and fatigue.  Ascending aorta was 4.3 cm on recent coronary CTA.  - Generally, he is very reticent to have procedures done but he agrees to TAVR consultation with Dr. Angelena Form.  He had a recent coronary CTA with nonobstructive disease.  He may find it easier to agree to have TAVR done if we can defer invasive angiography.  It looks like this appointment was cancelled due to coronavirus.  I will try to reschedule.  4. Shoulder girdle pain: Possible PMR, per PCP.   COVID screen The patient does not have any symptoms that suggest any further testing/ screening at this time.  Social distancing reinforced today.  Patient Risk: After full review of this patients clinical status, I feel that they are at moderate risk for cardiac decompensation at this time.  Relevant cardiac medications were reviewed at length with the patient today. The patient does not have concerns regarding their medications at this time.   Recommended follow-up:  3 months  Today, I have spent 16 minutes with the patient with telehealth technology discussing the above issues .    Signed, [name], MD  02/01/2019  [place] [address] [facility] [place] [address] [phone]  (office) [phone] (fax)

## 2019-02-28 ENCOUNTER — Telehealth: Payer: Self-pay

## 2019-02-28 NOTE — Telephone Encounter (Signed)

## 2019-03-01 ENCOUNTER — Encounter: Payer: Self-pay | Admitting: Cardiovascular Disease

## 2019-03-01 ENCOUNTER — Ambulatory Visit (INDEPENDENT_AMBULATORY_CARE_PROVIDER_SITE_OTHER): Payer: Medicare Other | Admitting: Cardiovascular Disease

## 2019-03-01 ENCOUNTER — Other Ambulatory Visit: Payer: Self-pay

## 2019-03-01 ENCOUNTER — Telehealth: Payer: Self-pay | Admitting: *Deleted

## 2019-03-01 VITALS — BP 164/84 | HR 55 | Ht 70.0 in | Wt 194.6 lb

## 2019-03-01 DIAGNOSIS — I350 Nonrheumatic aortic (valve) stenosis: Secondary | ICD-10-CM | POA: Diagnosis not present

## 2019-03-01 LAB — CBC
Hematocrit: 35.3 % — ABNORMAL LOW (ref 37.5–51.0)
Hemoglobin: 12.3 g/dL — ABNORMAL LOW (ref 13.0–17.7)
MCH: 31.9 pg (ref 26.6–33.0)
MCHC: 34.8 g/dL (ref 31.5–35.7)
MCV: 92 fL (ref 79–97)
Platelets: 175 10*3/uL (ref 150–450)
RBC: 3.86 x10E6/uL — ABNORMAL LOW (ref 4.14–5.80)
RDW: 13.4 % (ref 11.6–15.4)
WBC: 6.1 10*3/uL (ref 3.4–10.8)

## 2019-03-01 LAB — BASIC METABOLIC PANEL
BUN/Creatinine Ratio: 18 (ref 10–24)
BUN: 17 mg/dL (ref 8–27)
CO2: 27 mmol/L (ref 20–29)
Calcium: 9.3 mg/dL (ref 8.6–10.2)
Chloride: 102 mmol/L (ref 96–106)
Creatinine, Ser: 0.92 mg/dL (ref 0.76–1.27)
GFR calc Af Amer: 91 mL/min/{1.73_m2} (ref >59)
GFR calc non Af Amer: 79 mL/min/{1.73_m2} (ref >59)
Glucose: 101 mg/dL — ABNORMAL HIGH (ref 65–99)
Potassium: 3.7 mmol/L (ref 3.5–5.2)
Sodium: 141 mmol/L (ref 134–144)

## 2019-03-01 NOTE — H&P (View-Only) (Signed)
[facility]ural [facility] Clinic Consult Note  Chief Complaint  Patient presents with  . New Patient (Initial Visit)    severe aortic valve stenosis    History of Present Illness:78 yo male with history of HTN, paroxysmal atrial fibrillation, anemia, ascending aortic aneurysm, hyperlipidemia and aortic stenosis who is referred to the valve clinic today by Dr. Aundra Dubin for further evaluation of his aortic stenosis and discussion regarding TAVR. He is followed in our practice by Dr. Aundra Dubin and has been known to have aortic stenosis for several years. He has been see by Dr. Rayann Heman for management of his atrial fibrillation and has had multiple cardioversions in the past. He has been on Eliquis. Echo February 2020 with normal LV size and function, LVEF=60-65%. The aortic valve is thickened and calcified (mean gradient of 48 mmHg, peak gradient 65.9 mmHg, AVA 0.83 cm2) consistent with severe AS. No mitral valve abnormality. Coronary CTA February 2020 with mild CAD. His ascending aortic aneurysm was 4.3 cm on recent cardiac CTA.   He tells me today that he has dyspnea with moderate exertion that has been progressive. He is easily fatigued. No chest pain. No lower extremity edema.  He is married and lives with his wife in [place]. He is from [place]. His father died in the war. 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 He sees a Pharmacist, community regularly. No active dental issues.   Primary Care Physician: Cassandria Anger, MD Primary Cardiologist: [name] Referring Cardiologist: [name]  Past Medical History:  Diagnosis Date  . Acute blood loss anemia   . Adenomatous colon polyp   . Ascending aortic aneurysm ([place])   . Bicuspid aortic valve   . Chest pain    ETT-myoview 12/11 w/exercise, no chest pain, no significant ST changes, EF 69%, no evidence for ischemia or infarction.  . CHF NYHA class I (no symptoms from ordinary activities), acute, diastolic ([place])   . Fatty liver    mild  . GI bleed    . GI bleeding 07/21/2018   post polypectomy  . Hemorrhoids   . HTN (hypertension)   . Hypercholesteremia   . Hypokalemia   . Internal hemorrhoids   . LBP (low back pain)   . Moderate aortic stenosis   . Osteoarthritis   . Paroxysmal atrial fibrillation ([place])    a. new onset Afib in 07/2008. He underwent ibutilide cardioversion successfully. b. Recurrence 01/2013 s/p TEE/DCCV - was on Xarelto but he stopped it as he was convinced it was causing joint pn. c. Recurrence 01/2016 - spont conv to NSR. Pt took Eliquis x1 mo then declined further anticoag. d. Recurrence 07/2016.  [name] Pneumonia   . Tubular adenoma of colon     Past Surgical History:  Procedure Laterality Date  . BACK SURGERY  x12 years ago  . CARDIOVERSION N/A 01/26/2013   Procedure: CARDIOVERSION;  Surgeon: Larey Dresser, MD;  Location: [facility] ENDOSCOPY;  Service: Cardiovascular;  Laterality: N/A;  . CARDIOVERSION N/A 10/28/2017   Procedure: CARDIOVERSION;  Surgeon: Larey Dresser, MD;  Location: [facility] ENDOSCOPY;  Service: Cardiovascular;  Laterality: N/A;  . CARDIOVERSION N/A 03/03/2018   Procedure: CARDIOVERSION;  Surgeon: Lelon Perla, MD;  Location: [facility] ENDOSCOPY;  Service: Cardiovascular;  Laterality: N/A;  . COLONOSCOPY    . COLONOSCOPY  07/17/2018   at [facility]  . HEMORRHOID SURGERY    . LUMBAR LAMINECTOMY    . POLYPECTOMY    . TEE WITHOUT CARDIOVERSION N/A 01/26/2013   Procedure: TRANSESOPHAGEAL ECHOCARDIOGRAM (  TEE);  Surgeon: Larey Dresser, MD;  Location: [place];  Service: Cardiovascular;  Laterality: N/A;  . TEE WITHOUT CARDIOVERSION N/A 10/28/2017   Procedure: TRANSESOPHAGEAL ECHOCARDIOGRAM (TEE);  Surgeon: Larey Dresser, MD;  Location: [facility] ENDOSCOPY;  Service: Cardiovascular;  Laterality: N/A;    Current Outpatient Medications  Medication Sig Dispense Refill  . apixaban (ELIQUIS) 5 MG TABS tablet Take 1 tablet (5 mg total) by mouth 2 (two) times daily. 60 tablet 6  . famotidine (PEPCID) 40 MG tablet Take 1  tablet (40 mg total) by mouth daily. 30 tablet 11  . hydrochlorothiazide (MICROZIDE) 12.5 MG capsule Take 12.5 mg by mouth daily.    [name] HYDROcodone-acetaminophen (NORCO/VICODIN) 5-325 MG tablet Take 1 tablet by mouth every 6 (six) hours as needed for severe pain. 60 tablet 0  . loratadine (CLARITIN) 10 MG tablet Take 1 tablet (10 mg total) by mouth daily. 30 tablet 11  . metoprolol succinate (TOPROL-XL) 50 MG 24 hr tablet TAKE 1 TABLET (50 MG TOTAL) BY MOUTH 2 (TWO) TIMES DAILY. TAKE WITH OR IMMEDIATELY FOLLOWING A MEAL. 180 tablet 3  . predniSONE (DELTASONE) 5 MG tablet Take 3 tablets (15 mg total) by mouth daily with breakfast. 90 tablet 3  . Vitamin D, Cholecalciferol, 50 MCG (2000 UT) CAPS Take 2,000 Units by mouth daily.     No current facility-administered medications for this visit.     Allergies  Allergen Reactions  . Xarelto [Rivaroxaban] Other (See Comments) and Hypertension    INCREASED BP-HYPERTENSIVE EVENTS  . Corticosteroids Other (See Comments)    Made the patient  "sick," feel "weird," and his "body rejected" them Other reaction(s): Other (See Comments) Made the patient "sick," feel "weird," and his "body rejected" them  . Ramipril Other (See Comments)    Could not eat or sleep, lost muscle mass  . Benazepril Cough    Social History   Socioeconomic History  . Marital status: Married    Spouse name: Not on file  . Number of children: 0  . Years of education: Not on file  . Highest education level: Not on file  Occupational History  . Occupation: Retired Lobbyist: [employer]  Social Needs  . Financial resource strain: Not on file  . Food insecurity    Worry: Not on file    Inability: Not on file  . Transportation needs    Medical: Not on file    Non-medical: Not on file  Tobacco Use  . Smoking status: Never Smoker  . Smokeless tobacco: Never Used  Substance and Sexual Activity  . Alcohol use: Yes    Comment: Drinks 1 glass of wine  nightly/socially  . Drug use: No  . Sexual activity: Yes  Lifestyle  . Physical activity    Days per week: Not on file    Minutes per session: Not on file  . Stress: Not on file  Relationships  . Social Herbalist on phone: Not on file    Gets together: Not on file    Attends religious service: Not on file    Active member of club or organization: Not on file    Attends meetings of clubs or organizations: Not on file    Relationship status: Not on file  . Intimate partner violence    Fear of current or ex partner: Not on file    Emotionally abused: Not on file    Physically abused: Not on file  Forced sexual activity: Not on file  Other Topics Concern  . Not on file  Social History Narrative   Patient lives in [place] w/ his wife. He is a native of [place]. 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   Right-handed   Caffeine: 2 cups coffee per day    Family History  Problem Relation Age of Onset  . Colon cancer Mother 24  . Hypertension Other   . Coronary artery disease Neg Hx   . Colon polyps Neg Hx   . Esophageal cancer Neg Hx   . Rectal cancer Neg Hx   . Stomach cancer Neg Hx     Review of Systems:  As stated in the HPI and otherwise negative.   BP (!) 164/84   Pulse (!) 55   Ht 5\' 10"  (1.778 m)   Wt 194 lb 9.6 oz (88.3 kg)   SpO2 99%   BMI 27.92 kg/m   Physical Examination: General: Well developed, well nourished, NAD  HEENT: OP clear, mucus membranes moist  SKIN: warm, dry. No rashes. Neuro: No focal deficits  Musculoskeletal: Muscle strength 5/5 all ext  Psychiatric: Mood and affect normal  Neck: No JVD, no carotid bruits, no thyromegaly, no lymphadenopathy.  Lungs:Clear bilaterally, no wheezes, rhonci, crackles Cardiovascular: Regular rate and rhythm. Loud, harsh, late peaking systolic murmur.  Abdomen:Soft. Bowel sounds present. Non-tender.  Extremities: No lower extremity edema. Pulses are 2 + in  the bilateral DP/PT.  EKG:  EKG is ordered today. The ekg ordered today demonstrates sinus brady, rate 55 bpm. LVH.   Echo 10/26/18:  1. The left ventricle has normal systolic function with an ejection fraction of 60-65%. The cavity size was normal. There is mildly increased left ventricular wall thickness. Left ventricular diastolic Doppler parameters are consistent with  pseudonormalization No evidence of left ventricular regional wall motion abnormalities.  2. The right ventricle has normal systolic function. The cavity was normal. There is no increase in right ventricular wall thickness.  3. The mitral valve is normal in structure. There is mild mitral annular calcification present. No evidence of mitral valve stenosis. Trivial regurgitation.  4. The aortic valveis bicuspid There is severe calcifcation of the aortic valve. There is severe stenosis of the aortic valve. AVA 0.8 cm^2 with mean gradient 48 mmHg.  5. The pulmonic valve was normal in structure.  6. The aortic root and ascending aorta are normal in size and structure.  7. Right atrial pressure is estimated at 8 mmHg.  8. Left atrial size was mildly dilated.  9. Right atrial size was mildly dilated. 10. The tricuspid valve is normal in structure. 11. No evidence of left ventricular regional wall motion abnormalities. 12. The inferior vena cava was normal in size with <50% respiratory variability.  FINDINGS  Left Ventricle: The left ventricle has normal systolic function, with an ejection fraction of 60-65%. The cavity size was normal. There is mildly increased left ventricular wall thickness. Left ventricular diastolic Doppler parameters are consistent  with pseudonormalization No evidence of left ventricular regional wall motion abnormalities.. Right Ventricle: The right ventricle has normal systolic function. The cavity was normal. There is no increase in right ventricular wall thickness. Left Atrium: left atrial size was  mildly dilated Right Atrium: right atrial size was mildly dilated Right atrial pressure is estimated at 8 mmHg. Interatrial Septum: No atrial level shunt detected by color flow Doppler. Pericardium: There is no evidence of pericardial effusion. Mitral Valve:  The mitral valve is normal in structure. There is mild mitral annular calcification present. Mitral valve regurgitation is trivial by color flow Doppler. No evidence of mitral valve stenosis. Tricuspid Valve: The tricuspid valve is normal in structure. Tricuspid valve regurgitation is trivial by color flow Doppler. Aortic Valve: The aortic valveis bicuspid Aortic valve regurgitation is trivial by color flow Doppler. There is severe stenosis of the aortic valve, with a calculated valve area of 0.83 cm. Pulmonic Valve: The pulmonic valve was normal in structure. Pulmonic valve regurgitation is not visualized by color flow Doppler. Aorta: The aortic root and ascending aorta are normal in size and structure. Venous: The inferior vena cava is normal in size with less than 50% respiratory variability.   LEFT VENTRICLE PLAX 2D ([place])              Biplane EF (MOD) LV EF:          68.6 %       LV Biplane EF:   68.4 % LVIDd:          5.08 cm      LV A4C EF:       65.0 % LVIDs:          3.12 cm      LV A2C EF:       71.0 % LV PW:          1.11 cm LV IVS:         1.07 cm      Diastology LVOT diam:      1.90 cm      LV e' lateral:   7.51 cm/s LV SV:          84 ml        LV E/e' lateral: 13.2 LVOT Area:      2.84 cm     LV e' medial:    5.87 cm/s                              LV E/e' medial:  16.9 LV Volumes (MOD) LV area d, A2C:    27.40 cm LV area d, A4C:    33.30 cm LV area s, A2C:    13.30 cm LV area s, A4C:    17.40 cm LV major d, A2C:   7.89 cm LV major d, A4C:   8.09 cm LV major s, A2C:   6.50 cm LV major s, A4C:   6.46 cm LV vol d, MOD A2C: 83.5 ml LV vol d, MOD A4C: 114.0 ml LV vol s, MOD A2C: 24.2 ml LV vol s, MOD A4C: 39.9  ml LV SV MOD A2C:     59.3 ml LV SV MOD A4C:     114.0 ml LV SV MOD BP:      66.9 ml  RIGHT VENTRICLE RV S prime:     12.50 cm/s TAPSE (M-mode): 2.3 cm RVSP:           38.5 mmHg  LEFT ATRIUM             Index       RIGHT ATRIUM           Index LA diam:        4.80 cm 2.35 cm/m  RA Pressure: 8 mmHg LA Vol (A2C):   72.6 ml 35.47 ml/m RA Area:     24.20 cm LA Vol (A4C):   60.1 ml 29.36 ml/m  RA Volume:   76.20 ml  37.23 ml/m LA Biplane Vol: 66.7 ml 32.59 ml/m  AORTIC VALVE AV Area (Vmax):    0.93 cm AV Area (Vmean):   0.92 cm AV Area (VTI):     0.83 cm AV Vmax:           405.75 cm/s AV Vmean:          304.250 cm/s AV VTI:            0.953 m AV Peak Grad:      65.9 mmHg AV Mean Grad:      48.0 mmHg LVOT Vmax:         133.00 cm/s LVOT Vmean:        98.500 cm/s LVOT VTI:          0.279 m LVOT/AV VTI ratio: 0.29   AORTA Ao Root diam: 3.50 cm  MITRAL VALVE              TRICUSPID VALVE MV Area (PHT): 2.37 cm   TR Peak grad:   30.5 mmHg MV PHT:        92.80 msec TR Vmax:        276.00 cm/s MV Decel Time: 320 msec   RVSP:           38.5 mmHg MV E velocity: 99.40 cm/s MV A velocity: 76.60 cm/s MV E/A ratio:  1.30  Recent Labs: 07/26/2018: Magnesium 1.8 10/03/2018: BUN 15; Creatinine, Ser 0.85; Hemoglobin 13.5; Platelets 214.0; Potassium 4.0; Sodium 139; TSH 4.76   Lipid Panel    Component Value Date/Time   CHOL 144 08/01/2018 1119   TRIG 167.0 (H) 08/01/2018 1119   HDL 28.20 (L) 08/01/2018 1119   CHOLHDL 5 08/01/2018 1119   VLDL 33.4 08/01/2018 1119   LDLCALC 82 08/01/2018 1119   LDLDIRECT 151.5 11/20/2007 1623     Wt Readings from Last 3 Encounters:  03/01/19 194 lb 9.6 oz (88.3 kg)  01/29/19 188 lb (85.3 kg)  12/11/18 194 lb (88 kg)     Other studies Reviewed: Additional studies/ records that were reviewed today include: echo images Review of the above records demonstrates: severe aortic stenosis   Assessment and Plan:   1. Severe Aortic Valve  Stenosis: He has severe, stage D aortic valve stenosis. I have personally reviewed the echo images. The aortic valve is thickened, calcified with limited leaflet mobility. I think he would benefit from AVR. Given advanced age, he is not a good candidate for conventional AVR by surgical approach. I think he may be a good candidate for TAVR.   STS Risk Score: Risk of Mortality: 1.281% Renal Failure: 1.189% Permanent Stroke: 0.761% Prolonged Ventilation: 3.997% DSW Infection: 0.108% Reoperation: 3.223% Morbidity or Mortality: 7.573% Short Length of Stay: 41.333% Long Length of Stay: 3.892%   I have reviewed the natural history of aortic stenosis with the patient and their family members  who are present today. We have discussed the limitations of medical therapy and the poor prognosis associated with symptomatic aortic stenosis. We have reviewed potential treatment options, including palliative medical therapy, conventional surgical aortic valve replacement, and transcatheter aortic valve replacement. We discussed treatment options in the context of the patient's specific comorbid medical conditions.   He would like to proceed with planning for TAVR. I will arrange a right and left heart catheterization at [facility] March 07, 2019 at 8:30 am. Risks and benefits of the cath procedure and the valve procedure are reviewed with the patient. After  the cath, he will have a cardiac CT, CTA of the chest/abdomen and pelvis, carotid artery dopplers, PT assessment and PFTs and will then be referred to see one of the CT surgeons on our TAVR team.   He will hold Eliquis 2 days before his cath. Labs today.      Current medicines are reviewed at length with the patient today.  The patient does not have concerns regarding medicines.  The following changes have been made:  no change  Labs/ tests ordered today include:   Orders Placed This Encounter  Procedures  . CBC  . Basic Metabolic Panel (BMET)  .  EKG 12-Lead     Disposition:   FU with the valve team.    Signed, Lauree Chandler, MD 03/01/2019 10:11 AM    [address], [place], [place]  [address] Phone: [phone]; Fax: [phone]

## 2019-03-01 NOTE — Telephone Encounter (Signed)
Covid screening is June 20,2020 at 10:55 at [facility] thru.  Pt aware of testing time and location. He is aware to quarantine after testing.

## 2019-03-01 NOTE — Patient Instructions (Addendum)
Medication Instructions:  Your physician recommends that you continue on your current medications as directed. Please refer to the Current Medication list given to you today. See instructions below for procedure If you need a refill on your cardiac medications before your next appointment, please call your pharmacy.   Lab work: Lab work done in office on June 18--CBC and BMP  If you have labs (blood work) drawn today and your tests are completely normal, you will receive your results only by: [name] MyChart Message (if you have MyChart) OR . A paper copy in the mail If you have any lab test that is abnormal or we need to change your treatment, we will call you to review the results.  Testing/Procedures: Your physician has requested that you have a cardiac catheterization. Cardiac catheterization is used to diagnose and/or treat various heart conditions. Doctors may recommend this procedure for a number of different reasons. The most common reason is to evaluate chest pain. Chest pain can be a symptom of coronary artery disease (CAD), and cardiac catheterization can show whether plaque is narrowing or blocking your heart's arteries. This procedure is also used to evaluate the valves, as well as measure the blood flow and oxygen levels in different parts of your heart. For further information please visit [facility]. Please follow instruction sheet, as given.  Scheduled for June 24,2020  Follow-Up:  To be arranged Any Other Special Instructions Will Be Listed Below (If Applicable).     [place] OFFICE [place], [place]  [place] [address] Dept: [phone] Loc: [place]  03/01/2019  You are scheduled for a Cardiac Catheterization on Wednesday, June 24 with Dr. Lauree Chandler.  1. Please arrive at the [facility] (Main Entrance A) at [facility]: [address] [place], [place] [address] at 6:30 AM (This time is two hours before your procedure to ensure your preparation). Free valet parking service is available.   Special note: Every effort is made to have your procedure done on time. Please understand that emergencies sometimes delay scheduled procedures.  2. Diet: Do not eat solid foods after midnight.  The patient may have clear liquids until 5am upon the day of the procedure.  3. Labs: done in office on June 18,2020    4. Medication instructions in preparation for your procedure:   Contrast Allergy: No   Stop Eliquis after evening dose on June 21,2020 Do not take Hydrochlorothiazide the morning of the procedure.   On the morning of your procedure, take  Aspirin 81 mg and any morning medicines NOT listed above.  You may use sips of water.  5. Plan for one night stay--bring personal belongings. 6. Bring a current list of your medications and current insurance cards. 7. You MUST have a responsible person to drive you home. 8. Someone MUST be with you the first 24 hours after you arrive home or your discharge will be delayed. 9. Please wear clothes that are easy to get on and off and wear slip-on shoes.  We will call you to schedule covid testing for Saturday morning  Thank you for allowing [place] to care for you!   -- [place]

## 2019-03-01 NOTE — Progress Notes (Signed)
Structural Heart Clinic Consult Note  Chief Complaint  Patient presents with  . New Patient (Initial Visit)    severe aortic valve stenosis    History of Present Illness:78 yo male with history of HTN, paroxysmal atrial fibrillation, anemia, ascending aortic aneurysm, hyperlipidemia and aortic stenosis who is referred to the valve clinic today by Dr. Aundra Dubin for further evaluation of his aortic stenosis and discussion regarding TAVR. He is followed in our practice by Dr. Aundra Dubin and has been known to have aortic stenosis for several years. He has been see by Dr. Rayann Heman for management of his atrial fibrillation and has had multiple cardioversions in the past. He has been on Eliquis. Echo February 2020 with normal LV size and function, LVEF=60-65%. The aortic valve is thickened and calcified (mean gradient of 48 mmHg, peak gradient 65.9 mmHg, AVA 0.83 cm2) consistent with severe AS. No mitral valve abnormality. Coronary CTA February 2020 with mild CAD. His ascending aortic aneurysm was 4.3 cm on recent cardiac CTA.   He tells me today that he has dyspnea with moderate exertion that has been progressive. He is easily fatigued. No chest pain. No lower extremity edema.  He is married and lives with his wife in [place]. He is from [place]. His father died in the war. 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 He sees a Pharmacist, community regularly. No active dental issues.   Primary Care Physician: Cassandria Anger, MD Primary Cardiologist: [name] Referring Cardiologist: [name]  Past Medical History:  Diagnosis Date  . Acute blood loss anemia   . Adenomatous colon polyp   . Ascending aortic aneurysm ([place])   . Bicuspid aortic valve   . Chest pain    ETT-myoview 12/11 w/exercise, no chest pain, no significant ST changes, EF 69%, no evidence for ischemia or infarction.  . CHF NYHA class I (no symptoms from ordinary activities), acute, diastolic ([place])   . Fatty liver    mild  . GI bleed    . GI bleeding 07/21/2018   post polypectomy  . Hemorrhoids   . HTN (hypertension)   . Hypercholesteremia   . Hypokalemia   . Internal hemorrhoids   . LBP (low back pain)   . Moderate aortic stenosis   . Osteoarthritis   . Paroxysmal atrial fibrillation ([place])    a. new onset Afib in 07/2008. He underwent ibutilide cardioversion successfully. b. Recurrence 01/2013 s/p TEE/DCCV - was on Xarelto but he stopped it as he was convinced it was causing joint pn. c. Recurrence 01/2016 - spont conv to NSR. Pt took Eliquis x1 mo then declined further anticoag. d. Recurrence 07/2016.  [name] Pneumonia   . Tubular adenoma of colon     Past Surgical History:  Procedure Laterality Date  . BACK SURGERY  x12 years ago  . CARDIOVERSION N/A 01/26/2013   Procedure: CARDIOVERSION;  Surgeon: Larey Dresser, MD;  Location: [facility] ENDOSCOPY;  Service: Cardiovascular;  Laterality: N/A;  . CARDIOVERSION N/A 10/28/2017   Procedure: CARDIOVERSION;  Surgeon: Larey Dresser, MD;  Location: [facility] ENDOSCOPY;  Service: Cardiovascular;  Laterality: N/A;  . CARDIOVERSION N/A 03/03/2018   Procedure: CARDIOVERSION;  Surgeon: Lelon Perla, MD;  Location: [facility] ENDOSCOPY;  Service: Cardiovascular;  Laterality: N/A;  . COLONOSCOPY    . COLONOSCOPY  07/17/2018   at [facility]  . HEMORRHOID SURGERY    . LUMBAR LAMINECTOMY    . POLYPECTOMY    . TEE WITHOUT CARDIOVERSION N/A 01/26/2013   Procedure: TRANSESOPHAGEAL ECHOCARDIOGRAM (  TEE);  Surgeon: Larey Dresser, MD;  Location: [place];  Service: Cardiovascular;  Laterality: N/A;  . TEE WITHOUT CARDIOVERSION N/A 10/28/2017   Procedure: TRANSESOPHAGEAL ECHOCARDIOGRAM (TEE);  Surgeon: Larey Dresser, MD;  Location: [facility] ENDOSCOPY;  Service: Cardiovascular;  Laterality: N/A;    Current Outpatient Medications  Medication Sig Dispense Refill  . apixaban (ELIQUIS) 5 MG TABS tablet Take 1 tablet (5 mg total) by mouth 2 (two) times daily. 60 tablet 6  . famotidine (PEPCID) 40 MG tablet Take 1  tablet (40 mg total) by mouth daily. 30 tablet 11  . hydrochlorothiazide (MICROZIDE) 12.5 MG capsule Take 12.5 mg by mouth daily.    [name] HYDROcodone-acetaminophen (NORCO/VICODIN) 5-325 MG tablet Take 1 tablet by mouth every 6 (six) hours as needed for severe pain. 60 tablet 0  . loratadine (CLARITIN) 10 MG tablet Take 1 tablet (10 mg total) by mouth daily. 30 tablet 11  . metoprolol succinate (TOPROL-XL) 50 MG 24 hr tablet TAKE 1 TABLET (50 MG TOTAL) BY MOUTH 2 (TWO) TIMES DAILY. TAKE WITH OR IMMEDIATELY FOLLOWING A MEAL. 180 tablet 3  . predniSONE (DELTASONE) 5 MG tablet Take 3 tablets (15 mg total) by mouth daily with breakfast. 90 tablet 3  . Vitamin D, Cholecalciferol, 50 MCG (2000 UT) CAPS Take 2,000 Units by mouth daily.     No current facility-administered medications for this visit.     Allergies  Allergen Reactions  . Xarelto [Rivaroxaban] Other (See Comments) and Hypertension    INCREASED BP-HYPERTENSIVE EVENTS  . Corticosteroids Other (See Comments)    Made the patient  "sick," feel "weird," and his "body rejected" them Other reaction(s): Other (See Comments) Made the patient "sick," feel "weird," and his "body rejected" them  . Ramipril Other (See Comments)    Could not eat or sleep, lost muscle mass  . Benazepril Cough    Social History   Socioeconomic History  . Marital status: Married    Spouse name: Not on file  . Number of children: 0  . Years of education: Not on file  . Highest education level: Not on file  Occupational History  . Occupation: Retired Lobbyist: [employer]  Social Needs  . Financial resource strain: Not on file  . Food insecurity    Worry: Not on file    Inability: Not on file  . Transportation needs    Medical: Not on file    Non-medical: Not on file  Tobacco Use  . Smoking status: Never Smoker  . Smokeless tobacco: Never Used  Substance and Sexual Activity  . Alcohol use: Yes    Comment: Drinks 1 glass of wine  nightly/socially  . Drug use: No  . Sexual activity: Yes  Lifestyle  . Physical activity    Days per week: Not on file    Minutes per session: Not on file  . Stress: Not on file  Relationships  . Social Herbalist on phone: Not on file    Gets together: Not on file    Attends religious service: Not on file    Active member of club or organization: Not on file    Attends meetings of clubs or organizations: Not on file    Relationship status: Not on file  . Intimate partner violence    Fear of current or ex partner: Not on file    Emotionally abused: Not on file    Physically abused: Not on file  Forced sexual activity: Not on file  Other Topics Concern  . Not on file  Social History Narrative   Patient lives in [place] w/ his wife. He is a native of [place]. 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   Right-handed   Caffeine: 2 cups coffee per day    Family History  Problem Relation Age of Onset  . Colon cancer Mother 35  . Hypertension Other   . Coronary artery disease Neg Hx   . Colon polyps Neg Hx   . Esophageal cancer Neg Hx   . Rectal cancer Neg Hx   . Stomach cancer Neg Hx     Review of Systems:  As stated in the HPI and otherwise negative.   BP (!) 164/84   Pulse (!) 55   Ht 5\' 10"  (1.778 m)   Wt 194 lb 9.6 oz (88.3 kg)   SpO2 99%   BMI 27.92 kg/m   Physical Examination: General: Well developed, well nourished, NAD  HEENT: OP clear, mucus membranes moist  SKIN: warm, dry. No rashes. Neuro: No focal deficits  Musculoskeletal: Muscle strength 5/5 all ext  Psychiatric: Mood and affect normal  Neck: No JVD, no carotid bruits, no thyromegaly, no lymphadenopathy.  Lungs:Clear bilaterally, no wheezes, rhonci, crackles Cardiovascular: Regular rate and rhythm. Loud, harsh, late peaking systolic murmur.  Abdomen:Soft. Bowel sounds present. Non-tender.  Extremities: No lower extremity edema. Pulses are 2 + in  the bilateral DP/PT.  EKG:  EKG is ordered today. The ekg ordered today demonstrates sinus brady, rate 55 bpm. LVH.   Echo 10/26/18:  1. The left ventricle has normal systolic function with an ejection fraction of 60-65%. The cavity size was normal. There is mildly increased left ventricular wall thickness. Left ventricular diastolic Doppler parameters are consistent with  pseudonormalization No evidence of left ventricular regional wall motion abnormalities.  2. The right ventricle has normal systolic function. The cavity was normal. There is no increase in right ventricular wall thickness.  3. The mitral valve is normal in structure. There is mild mitral annular calcification present. No evidence of mitral valve stenosis. Trivial regurgitation.  4. The aortic valveis bicuspid There is severe calcifcation of the aortic valve. There is severe stenosis of the aortic valve. AVA 0.8 cm^2 with mean gradient 48 mmHg.  5. The pulmonic valve was normal in structure.  6. The aortic root and ascending aorta are normal in size and structure.  7. Right atrial pressure is estimated at 8 mmHg.  8. Left atrial size was mildly dilated.  9. Right atrial size was mildly dilated. 10. The tricuspid valve is normal in structure. 11. No evidence of left ventricular regional wall motion abnormalities. 12. The inferior vena cava was normal in size with <50% respiratory variability.  FINDINGS  Left Ventricle: The left ventricle has normal systolic function, with an ejection fraction of 60-65%. The cavity size was normal. There is mildly increased left ventricular wall thickness. Left ventricular diastolic Doppler parameters are consistent  with pseudonormalization No evidence of left ventricular regional wall motion abnormalities.. Right Ventricle: The right ventricle has normal systolic function. The cavity was normal. There is no increase in right ventricular wall thickness. Left Atrium: left atrial size was  mildly dilated Right Atrium: right atrial size was mildly dilated Right atrial pressure is estimated at 8 mmHg. Interatrial Septum: No atrial level shunt detected by color flow Doppler. Pericardium: There is no evidence of pericardial effusion. Mitral Valve:  The mitral valve is normal in structure. There is mild mitral annular calcification present. Mitral valve regurgitation is trivial by color flow Doppler. No evidence of mitral valve stenosis. Tricuspid Valve: The tricuspid valve is normal in structure. Tricuspid valve regurgitation is trivial by color flow Doppler. Aortic Valve: The aortic valveis bicuspid Aortic valve regurgitation is trivial by color flow Doppler. There is severe stenosis of the aortic valve, with a calculated valve area of 0.83 cm. Pulmonic Valve: The pulmonic valve was normal in structure. Pulmonic valve regurgitation is not visualized by color flow Doppler. Aorta: The aortic root and ascending aorta are normal in size and structure. Venous: The inferior vena cava is normal in size with less than 50% respiratory variability.   LEFT VENTRICLE PLAX 2D ([place])              Biplane EF (MOD) LV EF:          68.6 %       LV Biplane EF:   68.4 % LVIDd:          5.08 cm      LV A4C EF:       65.0 % LVIDs:          3.12 cm      LV A2C EF:       71.0 % LV PW:          1.11 cm LV IVS:         1.07 cm      Diastology LVOT diam:      1.90 cm      LV e' lateral:   7.51 cm/s LV SV:          84 ml        LV E/e' lateral: 13.2 LVOT Area:      2.84 cm     LV e' medial:    5.87 cm/s                              LV E/e' medial:  16.9 LV Volumes (MOD) LV area d, A2C:    27.40 cm LV area d, A4C:    33.30 cm LV area s, A2C:    13.30 cm LV area s, A4C:    17.40 cm LV major d, A2C:   7.89 cm LV major d, A4C:   8.09 cm LV major s, A2C:   6.50 cm LV major s, A4C:   6.46 cm LV vol d, MOD A2C: 83.5 ml LV vol d, MOD A4C: 114.0 ml LV vol s, MOD A2C: 24.2 ml LV vol s, MOD A4C: 39.9  ml LV SV MOD A2C:     59.3 ml LV SV MOD A4C:     114.0 ml LV SV MOD BP:      66.9 ml  RIGHT VENTRICLE RV S prime:     12.50 cm/s TAPSE (M-mode): 2.3 cm RVSP:           38.5 mmHg  LEFT ATRIUM             Index       RIGHT ATRIUM           Index LA diam:        4.80 cm 2.35 cm/m  RA Pressure: 8 mmHg LA Vol (A2C):   72.6 ml 35.47 ml/m RA Area:     24.20 cm LA Vol (A4C):   60.1 ml 29.36 ml/m  RA Volume:   76.20 ml  37.23 ml/m LA Biplane Vol: 66.7 ml 32.59 ml/m  AORTIC VALVE AV Area (Vmax):    0.93 cm AV Area (Vmean):   0.92 cm AV Area (VTI):     0.83 cm AV Vmax:           405.75 cm/s AV Vmean:          304.250 cm/s AV VTI:            0.953 m AV Peak Grad:      65.9 mmHg AV Mean Grad:      48.0 mmHg LVOT Vmax:         133.00 cm/s LVOT Vmean:        98.500 cm/s LVOT VTI:          0.279 m LVOT/AV VTI ratio: 0.29   AORTA Ao Root diam: 3.50 cm  MITRAL VALVE              TRICUSPID VALVE MV Area (PHT): 2.37 cm   TR Peak grad:   30.5 mmHg MV PHT:        92.80 msec TR Vmax:        276.00 cm/s MV Decel Time: 320 msec   RVSP:           38.5 mmHg MV E velocity: 99.40 cm/s MV A velocity: 76.60 cm/s MV E/A ratio:  1.30  Recent Labs: 07/26/2018: Magnesium 1.8 10/03/2018: BUN 15; Creatinine, Ser 0.85; Hemoglobin 13.5; Platelets 214.0; Potassium 4.0; Sodium 139; TSH 4.76   Lipid Panel    Component Value Date/Time   CHOL 144 08/01/2018 1119   TRIG 167.0 (H) 08/01/2018 1119   HDL 28.20 (L) 08/01/2018 1119   CHOLHDL 5 08/01/2018 1119   VLDL 33.4 08/01/2018 1119   LDLCALC 82 08/01/2018 1119   LDLDIRECT 151.5 11/20/2007 1623     Wt Readings from Last 3 Encounters:  03/01/19 194 lb 9.6 oz (88.3 kg)  01/29/19 188 lb (85.3 kg)  12/11/18 194 lb (88 kg)     Other studies Reviewed: Additional studies/ records that were reviewed today include: echo images Review of the above records demonstrates: severe aortic stenosis   Assessment and Plan:   1. Severe Aortic Valve  Stenosis: He has severe, stage D aortic valve stenosis. I have personally reviewed the echo images. The aortic valve is thickened, calcified with limited leaflet mobility. I think he would benefit from AVR. Given advanced age, he is not a good candidate for conventional AVR by surgical approach. I think he may be a good candidate for TAVR.   STS Risk Score: Risk of Mortality: 1.281% Renal Failure: 1.189% Permanent Stroke: 0.761% Prolonged Ventilation: 3.997% DSW Infection: 0.108% Reoperation: 3.223% Morbidity or Mortality: 7.573% Short Length of Stay: 41.333% Long Length of Stay: 3.892%   I have reviewed the natural history of aortic stenosis with the patient and their family members  who are present today. We have discussed the limitations of medical therapy and the poor prognosis associated with symptomatic aortic stenosis. We have reviewed potential treatment options, including palliative medical therapy, conventional surgical aortic valve replacement, and transcatheter aortic valve replacement. We discussed treatment options in the context of the patient's specific comorbid medical conditions.   He would like to proceed with planning for TAVR. I will arrange a right and left heart catheterization at [facility] March 07, 2019 at 8:30 am. Risks and benefits of the cath procedure and the valve procedure are reviewed with the patient. After  the cath, he will have a cardiac CT, CTA of the chest/abdomen and pelvis, carotid artery dopplers, PT assessment and PFTs and will then be referred to see one of the CT surgeons on our TAVR team.   He will hold Eliquis 2 days before his cath. Labs today.      Current medicines are reviewed at length with the patient today.  The patient does not have concerns regarding medicines.  The following changes have been made:  no change  Labs/ tests ordered today include:   Orders Placed This Encounter  Procedures  . CBC  . Basic Metabolic Panel (BMET)  .  EKG 12-Lead     Disposition:   FU with the valve team.    Signed, Lauree Chandler, MD 03/01/2019 10:11 AM    [address], [place], [place]  [address] Phone: [phone]; Fax: [phone]

## 2019-03-03 ENCOUNTER — Other Ambulatory Visit (HOSPITAL_COMMUNITY)
Admission: RE | Admit: 2019-03-03 | Discharge: 2019-03-03 | Disposition: A | Discharge status: Home / Self Care | Payer: Medicare Other | Source: Ambulatory Visit | Attending: Cardiovascular Disease | Admitting: Cardiovascular Disease

## 2019-03-03 DIAGNOSIS — Z1159 Encounter for screening for other viral diseases: Secondary | ICD-10-CM | POA: Insufficient documentation

## 2019-03-03 LAB — SARS CORONAVIRUS 2 (TAT 6-24 HRS): SARS Coronavirus 2: NEGATIVE

## 2019-03-06 ENCOUNTER — Telehealth: Payer: Self-pay | Admitting: *Deleted

## 2019-03-06 NOTE — Telephone Encounter (Signed)
Pt contacted pre-catheterization scheduled at [facility] for: Wednesday March 07, 2019 8:30 AM Verified arrival time and place: [facility] Main Entrance A at:6:30 AM  Covid-19 test date:03/03/19  No solid food after midnight prior to cath, clear liquids until 5 AM day of procedure. Contrast allergy:no  Hold: Eliquis-03/05/19 until post procedure. HCTZ-AM of procedure.  Except hold medications AM meds can be  taken pre-cath with sip of water including: ASA 81 mg  Confirmed patient has responsible person to drive home post procedure and observe 24 hours after arriving home: yes  Due to Covid-19 pandemic no visitors are allowed in the hospital (unless cognitive impairment).  Their designated party will be called when their procedure is over for an update and to arrange pick up.  Patients are required to wear a mask when they enter the hospital.        COVID-19 Pre-Screening Questions:  . In the past 7 to 10 days have you had a cough,  shortness of breath, headache, congestion, fever (100 or greater) body aches, chills, sore throat, or sudden loss of taste or sense of smell? no . Have you been around anyone with known Covid 19? no . Have you been around anyone who is awaiting Covid 19 test results in the past 7 to 10 days? no . Have you been around anyone who has been exposed to Covid 19, or has mentioned symptoms of Covid 19 within the past 7 to 10 days? no  I reviewed procedure instructions/mask/visitor/Covid-19 screening questions with patient, he verbalized understanding, thanked me for call.

## 2019-03-07 ENCOUNTER — Ambulatory Visit (HOSPITAL_COMMUNITY)
Admission: RE | Disposition: A | Discharge status: Home / Self Care | Payer: Self-pay | Source: Home / Self Care | Attending: Cardiovascular Disease

## 2019-03-07 ENCOUNTER — Other Ambulatory Visit: Payer: Self-pay

## 2019-03-07 ENCOUNTER — Encounter (HOSPITAL_COMMUNITY): Payer: Self-pay | Admitting: Cardiovascular Disease

## 2019-03-07 ENCOUNTER — Ambulatory Visit (HOSPITAL_COMMUNITY)
Admission: RE | Admit: 2019-03-07 | Discharge: 2019-03-07 | Disposition: A | Discharge status: Home / Self Care | Payer: Medicare Other | Attending: Cardiovascular Disease | Admitting: Cardiovascular Disease

## 2019-03-07 DIAGNOSIS — I712 Thoracic aortic aneurysm, without rupture: Secondary | ICD-10-CM | POA: Insufficient documentation

## 2019-03-07 DIAGNOSIS — I350 Nonrheumatic aortic (valve) stenosis: Secondary | ICD-10-CM

## 2019-03-07 DIAGNOSIS — Z888 Allergy status to other drugs, medicaments and biological substances status: Secondary | ICD-10-CM | POA: Diagnosis not present

## 2019-03-07 DIAGNOSIS — Z8249 Family history of ischemic heart disease and other diseases of the circulatory system: Secondary | ICD-10-CM | POA: Insufficient documentation

## 2019-03-07 DIAGNOSIS — Z7901 Long term (current) use of anticoagulants: Secondary | ICD-10-CM | POA: Diagnosis not present

## 2019-03-07 DIAGNOSIS — E785 Hyperlipidemia, unspecified: Secondary | ICD-10-CM | POA: Diagnosis not present

## 2019-03-07 DIAGNOSIS — Z79899 Other long term (current) drug therapy: Secondary | ICD-10-CM | POA: Insufficient documentation

## 2019-03-07 DIAGNOSIS — I110 Hypertensive heart disease with heart failure: Secondary | ICD-10-CM | POA: Diagnosis not present

## 2019-03-07 DIAGNOSIS — Q231 Congenital insufficiency of aortic valve: Secondary | ICD-10-CM | POA: Insufficient documentation

## 2019-03-07 DIAGNOSIS — I480 Paroxysmal atrial fibrillation: Secondary | ICD-10-CM | POA: Diagnosis not present

## 2019-03-07 DIAGNOSIS — K760 Fatty (change of) liver, not elsewhere classified: Secondary | ICD-10-CM | POA: Diagnosis not present

## 2019-03-07 DIAGNOSIS — I5032 Chronic diastolic (congestive) heart failure: Secondary | ICD-10-CM | POA: Diagnosis not present

## 2019-03-07 DIAGNOSIS — M1990 Unspecified osteoarthritis, unspecified site: Secondary | ICD-10-CM | POA: Diagnosis not present

## 2019-03-07 HISTORY — PX: RIGHT/LEFT HEART CATH AND CORONARY ANGIOGRAPHY: CATH118266

## 2019-03-07 LAB — POCT I-STAT 7, (LYTES, BLD GAS, ICA,H+H)
Acid-Base Excess: 3 mmol/L — ABNORMAL HIGH (ref 0.0–2.0)
Bicarbonate: 27.2 mmol/L (ref 20.0–28.0)
Calcium, Ion: 1.25 mmol/L (ref 1.15–1.40)
HCT: 38 % — ABNORMAL LOW (ref 39.0–52.0)
Hemoglobin: 12.9 g/dL — ABNORMAL LOW (ref 13.0–17.0)
O2 Saturation: 99 %
Potassium: 3.6 mmol/L (ref 3.5–5.1)
Sodium: 143 mmol/L (ref 135–145)
TCO2: 28 mmol/L (ref 22–32)
pCO2 arterial: 41.4 mmHg (ref 32.0–48.0)
pH, Arterial: 7.425 (ref 7.350–7.450)
pO2, Arterial: 130 mmHg — ABNORMAL HIGH (ref 83.0–108.0)

## 2019-03-07 LAB — POCT I-STAT EG7
Acid-Base Excess: 2 mmol/L (ref 0.0–2.0)
Bicarbonate: 28.3 mmol/L — ABNORMAL HIGH (ref 20.0–28.0)
Calcium, Ion: 1.31 mmol/L (ref 1.15–1.40)
HCT: 39 % (ref 39.0–52.0)
Hemoglobin: 13.3 g/dL (ref 13.0–17.0)
O2 Saturation: 78 %
Potassium: 3.7 mmol/L (ref 3.5–5.1)
Sodium: 143 mmol/L (ref 135–145)
TCO2: 30 mmol/L (ref 22–32)
pCO2, Ven: 48 mmHg (ref 44.0–60.0)
pH, Ven: 7.379 (ref 7.250–7.430)
pO2, Ven: 44 mmHg (ref 32.0–45.0)

## 2019-03-07 SURGERY — RIGHT/LEFT HEART CATH AND CORONARY ANGIOGRAPHY
Anesthesia: LOCAL

## 2019-03-07 MED ORDER — MIDAZOLAM HCL 2 MG/2ML IJ SOLN
INTRAMUSCULAR | Status: DC | PRN
  Administered 2019-03-07 (×2): 1 mg via INTRAVENOUS

## 2019-03-07 MED ORDER — FENTANYL CITRATE (PF) 100 MCG/2ML IJ SOLN
INTRAMUSCULAR | Status: DC | PRN
  Administered 2019-03-07 (×2): 25 ug via INTRAVENOUS

## 2019-03-07 MED ORDER — SODIUM CHLORIDE 0.9 % WEIGHT BASED INFUSION: 1.0000 mL/kg/h | INTRAVENOUS | Status: DC

## 2019-03-07 MED ORDER — LIDOCAINE HCL (PF) 1 % IJ SOLN
INTRAMUSCULAR | Status: AC
  Filled 2019-03-07: qty 30

## 2019-03-07 MED ORDER — VERAPAMIL HCL 2.5 MG/ML IV SOLN
INTRAVENOUS | Status: DC | PRN
  Administered 2019-03-07: 10 mL via INTRA_ARTERIAL

## 2019-03-07 MED ORDER — HYDRALAZINE HCL 20 MG/ML IJ SOLN: 10.0000 mg | INTRAMUSCULAR | Status: DC | PRN

## 2019-03-07 MED ORDER — FENTANYL CITRATE (PF) 100 MCG/2ML IJ SOLN
INTRAMUSCULAR | Status: AC
  Filled 2019-03-07: qty 2

## 2019-03-07 MED ORDER — HEPARIN (PORCINE) IN NACL 1000-0.9 UT/500ML-% IV SOLN
INTRAVENOUS | Status: AC
  Filled 2019-03-07: qty 1000

## 2019-03-07 MED ORDER — ACETAMINOPHEN 325 MG PO TABS: 650.0000 mg | ORAL_TABLET | ORAL | Status: DC | PRN

## 2019-03-07 MED ORDER — LABETALOL HCL 5 MG/ML IV SOLN: 10.0000 mg | INTRAVENOUS | Status: DC | PRN

## 2019-03-07 MED ORDER — SODIUM CHLORIDE 0.9% FLUSH: 3.0000 mL | INTRAVENOUS | Status: DC | PRN

## 2019-03-07 MED ORDER — VERAPAMIL HCL 2.5 MG/ML IV SOLN
INTRAVENOUS | Status: AC
  Filled 2019-03-07: qty 2

## 2019-03-07 MED ORDER — LIDOCAINE HCL (PF) 1 % IJ SOLN
INTRAMUSCULAR | Status: DC | PRN
  Administered 2019-03-07 (×2): 2 mL via INTRADERMAL

## 2019-03-07 MED ORDER — HEPARIN SODIUM (PORCINE) 1000 UNIT/ML IJ SOLN
INTRAMUSCULAR | Status: AC
  Filled 2019-03-07: qty 1

## 2019-03-07 MED ORDER — MIDAZOLAM HCL 2 MG/2ML IJ SOLN
INTRAMUSCULAR | Status: AC
  Filled 2019-03-07: qty 2

## 2019-03-07 MED ORDER — IOHEXOL 350 MG/ML SOLN
INTRAVENOUS | Status: DC | PRN
  Administered 2019-03-07: 80 mL via INTRA_ARTERIAL

## 2019-03-07 MED ORDER — SODIUM CHLORIDE 0.9 % IV SOLN: 250.0000 mL | INTRAVENOUS | Status: DC | PRN

## 2019-03-07 MED ORDER — ASPIRIN 81 MG PO CHEW: 81.0000 mg | CHEWABLE_TABLET | ORAL | Status: DC

## 2019-03-07 MED ORDER — ONDANSETRON HCL 4 MG/2ML IJ SOLN: 4.0000 mg | Freq: Four times a day (QID) | INTRAMUSCULAR | Status: DC | PRN

## 2019-03-07 MED ORDER — HEPARIN (PORCINE) IN NACL 1000-0.9 UT/500ML-% IV SOLN
INTRAVENOUS | Status: DC | PRN
  Administered 2019-03-07 (×2): 500 mL

## 2019-03-07 MED ORDER — SODIUM CHLORIDE 0.9% FLUSH: 3.0000 mL | Freq: Two times a day (BID) | INTRAVENOUS | Status: DC

## 2019-03-07 MED ORDER — HEPARIN SODIUM (PORCINE) 1000 UNIT/ML IJ SOLN
INTRAMUSCULAR | Status: DC | PRN
  Administered 2019-03-07: 4500 [IU] via INTRAVENOUS

## 2019-03-07 MED ORDER — SODIUM CHLORIDE 0.9 % IV SOLN: INTRAVENOUS | Status: AC

## 2019-03-07 MED ORDER — SODIUM CHLORIDE 0.9 % WEIGHT BASED INFUSION
3.0000 mL/kg/h | INTRAVENOUS | Status: AC
  Administered 2019-03-07: 3 mL/kg/h via INTRAVENOUS

## 2019-03-07 SURGICAL SUPPLY — 15 items
CATH 5FR JL3.5 JR4 ANG PIG MP (CATHETERS) ×1 IMPLANT
CATH BALLN WEDGE 5F 110CM (CATHETERS) ×1 IMPLANT
CATH INFINITI 5FR AL1 (CATHETERS) ×1 IMPLANT
CATH LAUNCHER 5F EBU3.5 (CATHETERS) ×1 IMPLANT
DEVICE RAD COMP TR BAND LRG (VASCULAR PRODUCTS) ×1 IMPLANT
GLIDESHEATH SLEND SS 6F .021 (SHEATH) ×1 IMPLANT
GUIDEWIRE INQWIRE 1.5J.035X260 (WIRE) IMPLANT
INQWIRE 1.5J .035X260CM (WIRE) ×2
KIT HEART LEFT (KITS) ×2 IMPLANT
PACK CARDIAC CATHETERIZATION (CUSTOM PROCEDURE TRAY) ×2 IMPLANT
SHEATH GLIDE SLENDER 4/5FR (SHEATH) ×1 IMPLANT
TRANSDUCER W/STOPCOCK (MISCELLANEOUS) ×2 IMPLANT
TUBING CIL FLEX 10 FLL-RA (TUBING) ×2 IMPLANT
WIRE EMERALD 3MM-J .025X260CM (WIRE) ×1 IMPLANT
WIRE EMERALD ST .035X150CM (WIRE) ×1 IMPLANT

## 2019-03-07 NOTE — Discharge Instructions (Signed)
Radial Site Care ° °This sheet gives you information about how to care for yourself after your procedure. Your health care provider may also give you more specific instructions. If you have problems or questions, contact your health care provider. °What can I expect after the procedure? °After the procedure, it is common to have: °· Bruising and tenderness at the catheter insertion area. °Follow these instructions at home: °Medicines °· Take over-the-counter and prescription medicines only as told by your health care provider. °Insertion site care °· Follow instructions from your health care provider about how to take care of your insertion site. Make sure you: °? Wash your hands with soap and water before you change your bandage (dressing). If soap and water are not available, use hand sanitizer. °? Change your dressing as told by your health care provider. °? Leave stitches (sutures), skin glue, or adhesive strips in place. These skin closures may need to stay in place for 2 weeks or longer. If adhesive strip edges start to loosen and curl up, you may trim the loose edges. Do not remove adhesive strips completely unless your health care provider tells you to do that. °· Check your insertion site every day for signs of infection. Check for: °? Redness, swelling, or pain. °? Fluid or blood. °? Pus or a bad smell. °? Warmth. °· Do not take baths, swim, or use a hot tub until your health care provider approves. °· You may shower 24-48 hours after the procedure, or as directed by your health care provider. °? Remove the dressing and gently wash the site with plain soap and water. °? Pat the area dry with a clean towel. °? Do not rub the site. That could cause bleeding. °· Do not apply powder or lotion to the site. °Activity ° °· For 24 hours after the procedure, or as directed by your health care provider: °? Do not flex or bend the affected arm. °? Do not push or pull heavy objects with the affected arm. °? Do not  drive yourself home from the hospital or clinic. You may drive 24 hours after the procedure unless your health care provider tells you not to. °? Do not operate machinery or power tools. °· Do not lift anything that is heavier than 10 lb (4.5 kg), or the limit that you are told, until your health care provider says that it is safe. °· Ask your health care provider when it is okay to: °? Return to work or school. °? Resume usual physical activities or sports. °? Resume sexual activity. °General instructions °· If the catheter site starts to bleed, raise your arm and put firm pressure on the site. If the bleeding does not stop, get help right away. This is a medical emergency. °· If you went home on the same day as your procedure, a responsible adult should be with you for the first 24 hours after you arrive home. °· Keep all follow-up visits as told by your health care provider. This is important. °Contact a health care provider if: °· You have a fever. °· You have redness, swelling, or yellow drainage around your insertion site. °Get help right away if: °· You have unusual pain at the radial site. °· The catheter insertion area swells very fast. °· The insertion area is bleeding, and the bleeding does not stop when you hold steady pressure on the area. °· Your arm or hand becomes pale, cool, tingly, or numb. °These symptoms may represent a serious problem   that is an emergency. Do not wait to see if the symptoms will go away. Get medical help right away. Call your local emergency services (911 in the [place] Do not drive yourself to the hospital. °Summary °· After the procedure, it is common to have bruising and tenderness at the site. °· Follow instructions from your health care provider about how to take care of your radial site wound. Check the wound every day for signs of infection. °· Do not lift anything that is heavier than 10 lb (4.5 kg), or the limit that you are told, until your health care provider says  that it is safe. °This information is not intended to replace advice given to you by your health care provider. Make sure you discuss any questions you have with your health care provider. °Document Released: 10/02/2010 Document Revised: 10/05/2017 Document Reviewed: 10/05/2017 °Elsevier Interactive Patient Education © 2019 [employer] ° °

## 2019-03-07 NOTE — Interval H&P Note (Signed)
History and Physical Interval Note:  03/07/2019 8:16 AM  [name] [name]  has presented today for cardiac cath with the diagnosis of aortic stenosis.  The various methods of treatment have been discussed with the patient and family. After consideration of risks, benefits and other options for treatment, the patient has consented to  Procedure(s): RIGHT/LEFT HEART CATH AND CORONARY ANGIOGRAPHY (N/A) as a surgical intervention.  The patient's history has been reviewed, patient examined, no change in status, stable for surgery.  I have reviewed the patient's chart and labs.  Questions were answered to the patient's satisfaction.    Cath Lab Visit (complete for each Cath Lab visit)  Clinical Evaluation Leading to the Procedure:   ACS: No.  Non-ACS:    Anginal Classification: CCS II  Anti-ischemic medical therapy: Minimal Therapy (1 class of medications)  Non-Invasive Test Results: No non-invasive testing performed  Prior CABG: No previous CABG         [name] [name]

## 2019-03-08 MED FILL — Lidocaine HCl Local Preservative Free (PF) Inj 1%: INTRAMUSCULAR | Qty: 30 | Status: AC

## 2019-03-08 MED FILL — Verapamil HCl IV Soln 2.5 MG/ML: INTRAVENOUS | Qty: 2 | Status: AC

## 2019-03-14 ENCOUNTER — Ambulatory Visit (HOSPITAL_BASED_OUTPATIENT_CLINIC_OR_DEPARTMENT_OTHER)
Admit: 2019-03-14 | Discharge: 2019-03-14 | Disposition: A | Discharge status: Home / Self Care | Payer: Medicare Other | Attending: Cardiovascular Disease | Admitting: Cardiovascular Disease

## 2019-03-14 ENCOUNTER — Ambulatory Visit (HOSPITAL_COMMUNITY)
Admission: RE | Admit: 2019-03-14 | Discharge: 2019-03-14 | Disposition: A | Discharge status: Home / Self Care | Payer: Medicare Other | Attending: Cardiovascular Disease | Admitting: Cardiovascular Disease

## 2019-03-14 ENCOUNTER — Ambulatory Visit (HOSPITAL_COMMUNITY)
Admission: RE | Admit: 2019-03-14 | Discharge: 2019-03-14 | Disposition: A | Discharge status: Home / Self Care | Payer: Medicare Other | Source: Home / Self Care | Attending: Cardiovascular Disease | Admitting: Cardiovascular Disease

## 2019-03-14 ENCOUNTER — Other Ambulatory Visit: Payer: Self-pay

## 2019-03-14 ENCOUNTER — Encounter (HOSPITAL_COMMUNITY): Payer: Self-pay

## 2019-03-14 DIAGNOSIS — Z01810 Encounter for preprocedural cardiovascular examination: Secondary | ICD-10-CM | POA: Diagnosis not present

## 2019-03-14 DIAGNOSIS — I350 Nonrheumatic aortic (valve) stenosis: Secondary | ICD-10-CM | POA: Diagnosis not present

## 2019-03-14 DIAGNOSIS — I712 Thoracic aortic aneurysm, without rupture: Secondary | ICD-10-CM | POA: Diagnosis not present

## 2019-03-14 DIAGNOSIS — I2510 Atherosclerotic heart disease of native coronary artery without angina pectoris: Secondary | ICD-10-CM | POA: Diagnosis not present

## 2019-03-14 MED ORDER — IOHEXOL 350 MG/ML SOLN
100.0000 mL | Freq: Once | INTRAVENOUS | Status: AC | PRN
  Administered 2019-03-14: 10:00:00 100 mL via INTRAVENOUS

## 2019-03-19 ENCOUNTER — Encounter: Payer: Self-pay | Admitting: Internal Medicine

## 2019-03-19 ENCOUNTER — Ambulatory Visit (INDEPENDENT_AMBULATORY_CARE_PROVIDER_SITE_OTHER): Payer: Medicare Other | Admitting: Internal Medicine

## 2019-03-19 ENCOUNTER — Other Ambulatory Visit: Payer: Self-pay

## 2019-03-19 VITALS — BP 150/84 | HR 65 | Temp 98.0°F | Ht 70.0 in | Wt 200.0 lb

## 2019-03-19 DIAGNOSIS — I480 Paroxysmal atrial fibrillation: Secondary | ICD-10-CM

## 2019-03-19 DIAGNOSIS — M353 Polymyalgia rheumatica: Secondary | ICD-10-CM

## 2019-03-19 DIAGNOSIS — I10 Essential (primary) hypertension: Secondary | ICD-10-CM

## 2019-03-19 DIAGNOSIS — I350 Nonrheumatic aortic (valve) stenosis: Secondary | ICD-10-CM

## 2019-03-19 DIAGNOSIS — M899 Disorder of bone, unspecified: Secondary | ICD-10-CM

## 2019-03-19 DIAGNOSIS — R9389 Abnormal findings on diagnostic imaging of other specified body structures: Secondary | ICD-10-CM

## 2019-03-19 MED ORDER — HYDROCODONE-ACETAMINOPHEN 5-325 MG PO TABS: 1.0000 | ORAL_TABLET | Freq: Four times a day (QID) | ORAL | 0 refills | Status: DC | PRN

## 2019-03-19 NOTE — Assessment & Plan Note (Signed)
Metoprolol, Hydrodiuril 

## 2019-03-19 NOTE — Progress Notes (Signed)
Subjective:  Patient ID: [name] [name], male    DOB: June 24, 1941  Age: 78 y.o. MRN: [id number]  CC: No chief complaint on file.   HPI [name] [name] presents for AS, ?PMR - better on Prednisone 4 tabs a day. Taking Advil prn.  Per Hx:  He has severe, stage D aortic valve stenosis. I have personally reviewed the echo images. The aortic valve is thickened, calcified with limited leaflet mobility. I think he would benefit from AVR. Given advanced age, he is not a good candidate for conventional AVR by surgical approach. I think he may be a good candidate for TAVR.    Outpatient Medications Prior to Visit  Medication Sig Dispense Refill   apixaban (ELIQUIS) 5 MG TABS tablet Take 1 tablet (5 mg total) by mouth 2 (two) times daily. 60 tablet 6   famotidine (PEPCID) 40 MG tablet Take 1 tablet (40 mg total) by mouth daily. 30 tablet 11   hydrochlorothiazide (MICROZIDE) 12.5 MG capsule Take 12.5 mg by mouth daily as needed (swelling).      HYDROcodone-acetaminophen (NORCO/VICODIN) 5-325 MG tablet Take 1 tablet by mouth every 6 (six) hours as needed for severe pain. 60 tablet 0   ibuprofen (ADVIL) 200 MG tablet Take 400 mg by mouth every 6 (six) hours as needed for moderate pain.     loratadine (CLARITIN) 10 MG tablet Take 1 tablet (10 mg total) by mouth daily. 30 tablet 11   metoprolol succinate (TOPROL-XL) 50 MG 24 hr tablet TAKE 1 TABLET (50 MG TOTAL) BY MOUTH 2 (TWO) TIMES DAILY. TAKE WITH OR IMMEDIATELY FOLLOWING A MEAL. 180 tablet 3   predniSONE (DELTASONE) 5 MG tablet Take 3 tablets (15 mg total) by mouth daily with breakfast. 90 tablet 3   Vitamin D, Cholecalciferol, 50 MCG (2000 UT) CAPS Take 2,000 Units by mouth daily.     No facility-administered medications prior to visit.     ROS: Review of Systems  Constitutional: Positive for fatigue. Negative for appetite change and unexpected weight change.  HENT: Negative for congestion, nosebleeds, sneezing, sore throat and trouble  swallowing.   Eyes: Negative for itching and visual disturbance.  Respiratory: Negative for cough.   Cardiovascular: Positive for leg swelling. Negative for chest pain and palpitations.  Gastrointestinal: Negative for abdominal distention, blood in stool, diarrhea and nausea.  Genitourinary: Negative for frequency and hematuria.  Musculoskeletal: Positive for arthralgias, back pain and gait problem. Negative for joint swelling and neck pain.  Skin: Negative for rash.  Neurological: Negative for dizziness, tremors, speech difficulty and weakness.  Psychiatric/Behavioral: Negative for agitation, dysphoric mood and sleep disturbance. The patient is not nervous/anxious.     Objective:  BP (!) 150/84 (BP Location: Left Arm, Patient Position: Sitting, Cuff Size: Normal)    Pulse 65    Temp 98 F (36.7 C) (Oral)    Ht 5' 10" (1.778 m)    Wt 200 lb (90.7 kg)    SpO2 99%    BMI 28.70 kg/m   BP Readings from Last 3 Encounters:  03/19/19 (!) 150/84  03/14/19 (!) 150/76  03/07/19 125/66    Wt Readings from Last 3 Encounters:  03/19/19 200 lb (90.7 kg)  03/07/19 190 lb (86.2 kg)  03/01/19 194 lb 9.6 oz (88.3 kg)    Physical Exam Constitutional:      General: He is not in acute distress.    Appearance: He is well-developed.     Comments: NAD  Eyes:     Conjunctiva/sclera:  Conjunctivae normal.     Pupils: Pupils are equal, round, and reactive to light.  Neck:     Musculoskeletal: Normal range of motion.     Thyroid: No thyromegaly.     Vascular: No JVD.  Cardiovascular:     Rate and Rhythm: Normal rate and regular rhythm.     Heart sounds: Murmur present. No friction rub. No gallop.   Pulmonary:     Effort: Pulmonary effort is normal. No respiratory distress.     Breath sounds: Normal breath sounds. No wheezing or rales.  Chest:     Chest wall: No tenderness.  Abdominal:     General: Bowel sounds are normal. There is no distension.     Palpations: Abdomen is soft. There is no  mass.     Tenderness: There is no abdominal tenderness. There is no guarding or rebound.  Musculoskeletal: Normal range of motion.        General: No tenderness.  Lymphadenopathy:     Cervical: No cervical adenopathy.  Skin:    General: Skin is warm and dry.     Findings: No rash.  Neurological:     Mental Status: He is alert and oriented to person, place, and time.     Cranial Nerves: No cranial nerve deficit.     Motor: No abnormal muscle tone.     Coordination: Coordination normal.     Gait: Gait normal.     Deep Tendon Reflexes: Reflexes are normal and symmetric.  Psychiatric:        Behavior: Behavior normal.        Thought Content: Thought content normal.        Judgment: Judgment normal.   feet w/trace edema  Lab Results  Component Value Date   WBC 6.1 03/01/2019   HGB 12.9 (L) 03/07/2019   HCT 38.0 (L) 03/07/2019   PLT 175 03/01/2019   GLUCOSE 101 (H) 03/01/2019   CHOL 144 08/01/2018   TRIG 167.0 (H) 08/01/2018   HDL 28.20 (L) 08/01/2018   LDLDIRECT 151.5 11/20/2007   LDLCALC 82 08/01/2018   ALT 19 10/28/2017   AST 26 10/28/2017   NA 143 03/07/2019   K 3.6 03/07/2019   CL 102 03/01/2019   CREATININE 0.92 03/01/2019   BUN 17 03/01/2019   CO2 27 03/01/2019   TSH 4.76 (H) 10/03/2018   PSA 4.20 (H) 08/01/2018   INR 1.15 07/21/2018   HGBA1C 5.7 (H) 08/02/2016    Ct Coronary Morph W/cta Cor W/score W/ca W/cm &/or Wo/cm  Addendum Date: 03/14/2019   ADDENDUM REPORT: 03/14/2019 15:33 CLINICAL DATA:  Aortic stenosis EXAM: Cardiac TAVR CT TECHNIQUE: The patient was scanned on a Siemens Force 322 slice scanner. A 120 kV retrospective scan was triggered in the descending thoracic aorta at 111 HU's. Gantry rotation speed was 270 msecs and collimation was .9 mm. No beta blockade or nitro were given. The 3D data set was reconstructed in 5% intervals of the R-R cycle. Systolic and diastolic phases were analyzed on a dedicated work station using MPR, MIP and VRT modes. The  patient received 80 cc of contrast. FINDINGS: Aortic Valve: Appears tri leaflet with only partial fusion of the left and right cusps Calcified with restricted leaflet motion Aorta: Mild aortic root dilatation with mild calcific atherosclerosis and normal arch vessels Sinotubular Junction: 30 mm Ascending Thoracic Aorta: 44 mm Aortic Arch: 25 mm Descending Thoracic Aorta: 30 mm Sinus of Valsalva Measurements: Non-coronary: 32.5 mm Right - coronary: 29.3 mm Left -  coronary: 34.3 mm Coronary Artery Height above Annulus: Left Main: Shallow at 8.6 mm above annulus on minimal measurement Right Coronary: 14 mm above annulus Virtual Basal Annulus Measurements: Maximum/Minimum Diameter: 29 mm x 18.8 mm Perimeter: 79 mm Area: 435 mm2 Coronary Arteries: LM shallow Optimum Fluoroscopic Angle for Delivery: LAO 23 Caudal 24 degrees IMPRESSION: 1. Tri leaflet AV with partially fused left and right cusps Annular area of 435 mm2 suitable for a 26 mm Sapien 3 valve 2.  Dilated aortic root 4.4 cm 3.  Shallow LM coronary artery 8.6 mm above annulus 4. Optimum angiographic angle for deployment LAO 23 Caudal 24 degrees [place] Electronically Signed   By: Jenkins Rouge M.D.   On: 03/14/2019 15:33   Result Date: 03/14/2019 EXAM: OVER-READ INTERPRETATION  CT CHEST The following report is an over-read performed by radiologist Dr. Vinnie Langton of [facility], [place] on 03/14/2019. This over-read does not include interpretation of cardiac or coronary anatomy or pathology. The coronary calcium score/coronary CTA interpretation by the cardiologist is attached. COMPARISON:  Cardiac CTA 10/24/2018. FINDINGS: Extracardiac findings will be described separately under dictation for contemporaneously obtained CTA chest, abdomen and pelvis. IMPRESSION: Please see separate dictation for contemporaneously obtained CTA chest, abdomen and pelvis dated 03/14/2019 for full description of relevant extracardiac findings. Electronically Signed: By:  Vinnie Langton M.D. On: 03/14/2019 12:58   Ct Angio Chest Aorta W &/or Wo Contrast  Result Date: 03/14/2019 CLINICAL DATA:  78 year old male with history of severe aortic stenosis. Preprocedural study prior to potential transcatheter aortic valve replacement (TAVR) procedure. EXAM: CT ANGIOGRAPHY CHEST, ABDOMEN AND PELVIS TECHNIQUE: Multidetector CT imaging through the chest, abdomen and pelvis was performed using the standard protocol during bolus administration of intravenous contrast. Multiplanar reconstructed images and MIPs were obtained and reviewed to evaluate the vascular anatomy. CONTRAST:  137m OMNIPAQUE IOHEXOL 350 MG/ML SOLN COMPARISON:  Cardiac CTA 10/24/2018. Chest CT 10/20/2016. CT the abdomen and pelvis 12/21/2004. FINDINGS: CTA CHEST FINDINGS Cardiovascular: Heart size is enlarged. Concentric left ventricular hypertrophy. There is no significant pericardial fluid, thickening or pericardial calcification. There is aortic atherosclerosis, as well as atherosclerosis of the great vessels of the mediastinum and the coronary arteries, including calcified atherosclerotic plaque in the left main, left anterior descending and right coronary arteries. Severe thickening and calcification of the aortic valve. Mild aneurysmal dilatation of the ascending thoracic aorta (4.5 cm in diameter). Mediastinum/Lymph Nodes: No pathologically enlarged mediastinal or hilar lymph nodes. Small hiatal hernia. No axillary lymphadenopathy. Lungs/Pleura: Areas of mild linear scarring in the mid lungs bilaterally. No acute consolidative airspace disease. No pleural effusions. A few scattered 2-3 mm pulmonary nodules are noted in the lungs bilaterally, nonspecific. No other larger more suspicious appearing pulmonary nodules or masses are noted. Musculoskeletal/Soft Tissues: There are no aggressive appearing lytic or blastic lesions noted in the visualized portions of the skeleton. CTA ABDOMEN AND PELVIS FINDINGS  Hepatobiliary: No suspicious cystic or solid hepatic lesions. No intra or extrahepatic biliary ductal dilatation. Gallbladder is normal in appearance. Pancreas: No pancreatic mass. No pancreatic ductal dilatation. No pancreatic or peripancreatic fluid or inflammatory changes. Spleen: Unremarkable. Adrenals/Urinary Tract: Low-attenuation lesions in both kidneys, compatible with simple cysts, largest of which is in the interpolar region of the left kidney measuring 3.4 cm in diameter. Other subcentimeter low-attenuation lesions in the right kidney are too small to definitively characterize, but are statistically likely to represent tiny cysts. Bilateral adrenal glands are normal in appearance. No hydroureteronephrosis. Urinary bladder is mildly trabeculated with  multiple small bladder wall diverticuli. Stomach/Bowel: Normal appearance of the stomach. No pathologic dilatation of small bowel or colon. Normal appendix. Vascular/Lymphatic: Aortic atherosclerosis, without evidence of aneurysm or dissection in the abdominal or pelvic vasculature. Vascular findings and measurements pertinent to potential TAVR procedure, as detailed below. No lymphadenopathy noted in the abdomen or pelvis. Reproductive: Prostate gland and seminal vesicles are unremarkable in appearance. Other: No significant volume of ascites.  No pneumoperitoneum. Musculoskeletal: Unusual lesions in the iliac bones bilaterally, and in the right ischium. These range from centrally lucent to sclerotic. The largest lesion is in the right ilium above the right acetabulum measuring at least 6.3 x 3.8 cm with central enhancing soft tissue or other high attenuation material. The lesion in the anterior aspect of the left ilium has central fatty attenuation (axial image 194 of series 15). The lesion in the right ischium demonstrates cortical breakthrough (axial image 207 of series 15). VASCULAR MEASUREMENTS PERTINENT TO TAVR: AORTA: Minimal Aortic Diameter-14 x 13 mm  Severity of Aortic Calcification-moderate RIGHT PELVIS: Right Common Iliac Artery - Minimal Diameter-10.7 x 10.0 mm Tortuosity-mild Calcification-mild Right External Iliac Artery - Minimal Diameter-7.7 x 7.9 mm Tortuosity-severe Calcification-none Right Common Femoral Artery - Minimal Diameter-8.0 x 8.2 mm Tortuosity-mild Calcification-minimal LEFT PELVIS: Left Common Iliac Artery - Minimal Diameter-10.3 x 9.6 mm Tortuosity-mild Calcification-mild Left External Iliac Artery - Minimal Diameter-8.5 x 7.8 mm Tortuosity-moderate to severe Calcification-none Left Common Femoral Artery - Minimal Diameter-8.2 x 8.1 mm Tortuosity-mild Calcification-minimal Review of the MIP images confirms the above findings. IMPRESSION: 1. Vascular findings and measurements pertinent to potential TAVR procedure, as detailed above. 2. Severe thickening calcification of the aortic valve, compatible with the reported clinical history of severe aortic stenosis. 3. Aortic atherosclerosis, in addition to left main and 2 vessel coronary artery disease. In addition, there is mild aneurysmal dilatation of the ascending thoracic aorta (4.5 cm in diameter. Ascending thoracic aortic aneurysm. Recommend semi-annual imaging followup by CTA or MRA and referral to cardiothoracic surgery if not already obtained. This recommendation follows 2010 ACCF/AHA/AATS/ACR/ASA/SCA/SCAI/SIR/STS/SVM Guidelines for the Diagnosis and Management of Patients With Thoracic Aortic Disease. Circulation. 2010; 121: Y101-B510. Aortic aneurysm NOS (ICD10-I71.9) 4. Indeterminate osseous lesions in the bony pelvis which warrants further characterization with nonemergent MRI of the pelvis with and without IV gadolinium in the near future to exclude an aggressive process such as multiple myeloma or metastatic disease. 5. Cardiomegaly with concentric left ventricular hypertrophy. 6. Scattered 2-3 mm pulmonary nodules in the lungs bilaterally, nonspecific but statistically likely  benign. No follow-up needed if patient is low-risk (and has no known or suspected primary neoplasm). Non-contrast chest CT can be considered in 12 months if patient is high-risk. This recommendation follows the consensus statement: Guidelines for Management of Incidental Pulmonary Nodules Detected on CT Images: From the Fleischner Society 2017; Radiology 2017; 284:228-243. Electronically Signed   By: Vinnie Langton M.D.   On: 03/14/2019 13:40   Vas US Carotid  Result Date: 03/14/2019 Carotid Arterial Duplex Study Indications:       Preop for TAVR. Risk Factors:      Coronary artery disease. Comparison Study:  No prior. Performing Technologist: Oda Cogan RDMS, RVT  Examination Guidelines: A complete evaluation includes B-mode imaging, spectral Doppler, color Doppler, and power Doppler as needed of all accessible portions of each vessel. Bilateral testing is considered an integral part of a complete examination. Limited examinations for reoccurring indications may be performed as noted.  Right Carotid Findings: +----------+--------+--------+--------+--------+--------+  PSV cm/s EDV cm/s Stenosis Describe Comments  +----------+--------+--------+--------+--------+--------+  CCA Prox   81       17                                   +----------+--------+--------+--------+--------+--------+  CCA Distal 69       15                                   +----------+--------+--------+--------+--------+--------+  ICA Prox   53       19                                   +----------+--------+--------+--------+--------+--------+  ICA Distal 72       25                                   +----------+--------+--------+--------+--------+--------+  ECA        69       9                                    +----------+--------+--------+--------+--------+--------+ +----------+--------+-------+----------------+-------------------+             PSV cm/s EDV cms Describe         Arm Pressure (mmHG)   +----------+--------+-------+----------------+-------------------+  Subclavian 84               Multiphasic, WNL                      +----------+--------+-------+----------------+-------------------+ +---------+--------+--+--------+--+---------+  Vertebral PSV cm/s 41 EDV cm/s 12 Antegrade  +---------+--------+--+--------+--+---------+  Left Carotid Findings: +----------+--------+--------+--------+--------+--------+             PSV cm/s EDV cm/s Stenosis Describe Comments  +----------+--------+--------+--------+--------+--------+  CCA Prox   101      19                                   +----------+--------+--------+--------+--------+--------+  CCA Distal 74       19                                   +----------+--------+--------+--------+--------+--------+  ICA Prox   53       19                                   +----------+--------+--------+--------+--------+--------+  ICA Distal 72       23                                   +----------+--------+--------+--------+--------+--------+  ECA        64       9                                    +----------+--------+--------+--------+--------+--------+ +----------+--------+--------+----------------+-------------------+  Subclavian PSV cm/s EDV cm/s Describe         Arm Pressure (mmHG)  +----------+--------+--------+----------------+-------------------+             99                Multiphasic, WNL                      +----------+--------+--------+----------------+-------------------+ +---------+--------+--+--------+--+---------+  Vertebral PSV cm/s 46 EDV cm/s 14 Antegrade  +---------+--------+--+--------+--+---------+  Summary: Right Carotid: There is no evidence of stenosis in the right ICA. Left Carotid: There is no evidence of stenosis in the left ICA. Vertebrals: Bilateral vertebral arteries demonstrate antegrade flow. *See table(s) above for measurements and observations.  Electronically signed by Curt Jews MD on 03/14/2019 at 2:23:16 PM.    Final    Ct Angio  Abd/pel W/ And/or W/o  Result Date: 03/14/2019 CLINICAL DATA:  78 year old male with history of severe aortic stenosis. Preprocedural study prior to potential transcatheter aortic valve replacement (TAVR) procedure. EXAM: CT ANGIOGRAPHY CHEST, ABDOMEN AND PELVIS TECHNIQUE: Multidetector CT imaging through the chest, abdomen and pelvis was performed using the standard protocol during bolus administration of intravenous contrast. Multiplanar reconstructed images and MIPs were obtained and reviewed to evaluate the vascular anatomy. CONTRAST:  162m OMNIPAQUE IOHEXOL 350 MG/ML SOLN COMPARISON:  Cardiac CTA 10/24/2018. Chest CT 10/20/2016. CT the abdomen and pelvis 12/21/2004. FINDINGS: CTA CHEST FINDINGS Cardiovascular: Heart size is enlarged. Concentric left ventricular hypertrophy. There is no significant pericardial fluid, thickening or pericardial calcification. There is aortic atherosclerosis, as well as atherosclerosis of the great vessels of the mediastinum and the coronary arteries, including calcified atherosclerotic plaque in the left main, left anterior descending and right coronary arteries. Severe thickening and calcification of the aortic valve. Mild aneurysmal dilatation of the ascending thoracic aorta (4.5 cm in diameter). Mediastinum/Lymph Nodes: No pathologically enlarged mediastinal or hilar lymph nodes. Small hiatal hernia. No axillary lymphadenopathy. Lungs/Pleura: Areas of mild linear scarring in the mid lungs bilaterally. No acute consolidative airspace disease. No pleural effusions. A few scattered 2-3 mm pulmonary nodules are noted in the lungs bilaterally, nonspecific. No other larger more suspicious appearing pulmonary nodules or masses are noted. Musculoskeletal/Soft Tissues: There are no aggressive appearing lytic or blastic lesions noted in the visualized portions of the skeleton. CTA ABDOMEN AND PELVIS FINDINGS Hepatobiliary: No suspicious cystic or solid hepatic lesions. No intra or  extrahepatic biliary ductal dilatation. Gallbladder is normal in appearance. Pancreas: No pancreatic mass. No pancreatic ductal dilatation. No pancreatic or peripancreatic fluid or inflammatory changes. Spleen: Unremarkable. Adrenals/Urinary Tract: Low-attenuation lesions in both kidneys, compatible with simple cysts, largest of which is in the interpolar region of the left kidney measuring 3.4 cm in diameter. Other subcentimeter low-attenuation lesions in the right kidney are too small to definitively characterize, but are statistically likely to represent tiny cysts. Bilateral adrenal glands are normal in appearance. No hydroureteronephrosis. Urinary bladder is mildly trabeculated with multiple small bladder wall diverticuli. Stomach/Bowel: Normal appearance of the stomach. No pathologic dilatation of small bowel or colon. Normal appendix. Vascular/Lymphatic: Aortic atherosclerosis, without evidence of aneurysm or dissection in the abdominal or pelvic vasculature. Vascular findings and measurements pertinent to potential TAVR procedure, as detailed below. No lymphadenopathy noted in the abdomen or pelvis. Reproductive: Prostate gland and seminal vesicles are unremarkable in appearance. Other: No significant volume of ascites.  No pneumoperitoneum. Musculoskeletal: Unusual lesions in the iliac bones bilaterally, and in the right ischium. These range from centrally lucent to  sclerotic. The largest lesion is in the right ilium above the right acetabulum measuring at least 6.3 x 3.8 cm with central enhancing soft tissue or other high attenuation material. The lesion in the anterior aspect of the left ilium has central fatty attenuation (axial image 194 of series 15). The lesion in the right ischium demonstrates cortical breakthrough (axial image 207 of series 15). VASCULAR MEASUREMENTS PERTINENT TO TAVR: AORTA: Minimal Aortic Diameter-14 x 13 mm Severity of Aortic Calcification-moderate RIGHT PELVIS: Right Common  Iliac Artery - Minimal Diameter-10.7 x 10.0 mm Tortuosity-mild Calcification-mild Right External Iliac Artery - Minimal Diameter-7.7 x 7.9 mm Tortuosity-severe Calcification-none Right Common Femoral Artery - Minimal Diameter-8.0 x 8.2 mm Tortuosity-mild Calcification-minimal LEFT PELVIS: Left Common Iliac Artery - Minimal Diameter-10.3 x 9.6 mm Tortuosity-mild Calcification-mild Left External Iliac Artery - Minimal Diameter-8.5 x 7.8 mm Tortuosity-moderate to severe Calcification-none Left Common Femoral Artery - Minimal Diameter-8.2 x 8.1 mm Tortuosity-mild Calcification-minimal Review of the MIP images confirms the above findings. IMPRESSION: 1. Vascular findings and measurements pertinent to potential TAVR procedure, as detailed above. 2. Severe thickening calcification of the aortic valve, compatible with the reported clinical history of severe aortic stenosis. 3. Aortic atherosclerosis, in addition to left main and 2 vessel coronary artery disease. In addition, there is mild aneurysmal dilatation of the ascending thoracic aorta (4.5 cm in diameter. Ascending thoracic aortic aneurysm. Recommend semi-annual imaging followup by CTA or MRA and referral to cardiothoracic surgery if not already obtained. This recommendation follows 2010 ACCF/AHA/AATS/ACR/ASA/SCA/SCAI/SIR/STS/SVM Guidelines for the Diagnosis and Management of Patients With Thoracic Aortic Disease. Circulation. 2010; 121: C163-A453. Aortic aneurysm NOS (ICD10-I71.9) 4. Indeterminate osseous lesions in the bony pelvis which warrants further characterization with nonemergent MRI of the pelvis with and without IV gadolinium in the near future to exclude an aggressive process such as multiple myeloma or metastatic disease. 5. Cardiomegaly with concentric left ventricular hypertrophy. 6. Scattered 2-3 mm pulmonary nodules in the lungs bilaterally, nonspecific but statistically likely benign. No follow-up needed if patient is low-risk (and has no known or  suspected primary neoplasm). Non-contrast chest CT can be considered in 12 months if patient is high-risk. This recommendation follows the consensus statement: Guidelines for Management of Incidental Pulmonary Nodules Detected on CT Images: From the Fleischner Society 2017; Radiology 2017; 284:228-243. Electronically Signed   By: Vinnie Langton M.D.   On: 03/14/2019 13:40    Assessment & Plan:   There are no diagnoses linked to this encounter.   No orders of the defined types were placed in this encounter.    Follow-up: No follow-ups on file.  [name], MD

## 2019-03-19 NOTE — Assessment & Plan Note (Signed)
Deltasone 20 mg/d w/Pepcid Better Rheum ref

## 2019-03-19 NOTE — Assessment & Plan Note (Signed)
Eliquis, Toprol

## 2019-03-19 NOTE — Assessment & Plan Note (Signed)
TAVR pending

## 2019-03-23 ENCOUNTER — Other Ambulatory Visit: Payer: Self-pay

## 2019-03-23 DIAGNOSIS — I350 Nonrheumatic aortic (valve) stenosis: Secondary | ICD-10-CM

## 2019-03-27 ENCOUNTER — Other Ambulatory Visit: Payer: Self-pay

## 2019-03-28 ENCOUNTER — Encounter: Payer: Self-pay | Admitting: Physical Therapy

## 2019-03-28 ENCOUNTER — Institutional Professional Consult (permissible substitution) (INDEPENDENT_AMBULATORY_CARE_PROVIDER_SITE_OTHER): Payer: Medicare Other | Admitting: Surgery

## 2019-03-28 ENCOUNTER — Ambulatory Visit (HOSPITAL_COMMUNITY)
Admission: RE | Admit: 2019-03-28 | Discharge: 2019-03-28 | Disposition: A | Discharge status: Home / Self Care | Payer: Medicare Other | Source: Ambulatory Visit | Attending: Surgery | Admitting: Surgery

## 2019-03-28 ENCOUNTER — Encounter: Payer: Self-pay | Admitting: Surgery

## 2019-03-28 ENCOUNTER — Other Ambulatory Visit: Payer: Self-pay

## 2019-03-28 ENCOUNTER — Ambulatory Visit: Payer: Medicare Other | Attending: Cardiovascular Disease | Admitting: Physical Therapy

## 2019-03-28 VITALS — BP 143/79 | HR 60 | Temp 97.7°F | Resp 16 | Ht 70.5 in | Wt 193.0 lb

## 2019-03-28 DIAGNOSIS — R9389 Abnormal findings on diagnostic imaging of other specified body structures: Secondary | ICD-10-CM | POA: Diagnosis not present

## 2019-03-28 DIAGNOSIS — I350 Nonrheumatic aortic (valve) stenosis: Secondary | ICD-10-CM

## 2019-03-28 DIAGNOSIS — M899 Disorder of bone, unspecified: Secondary | ICD-10-CM | POA: Diagnosis not present

## 2019-03-28 DIAGNOSIS — R262 Difficulty in walking, not elsewhere classified: Secondary | ICD-10-CM | POA: Diagnosis not present

## 2019-03-28 DIAGNOSIS — D1779 Benign lipomatous neoplasm of other sites: Secondary | ICD-10-CM | POA: Diagnosis not present

## 2019-03-28 MED ORDER — GADOBUTROL 1 MMOL/ML IV SOLN
8.0000 mL | Freq: Once | INTRAVENOUS | Status: AC | PRN
  Administered 2019-03-28: 19:00:00 8 mL via INTRAVENOUS

## 2019-03-28 NOTE — Progress Notes (Signed)
Patient ID: [name] [name], male   DOB: 02-May-1941, 78 y.o.   MRN: [id number]  [facility] CONSULTATION REPORT  Referring Provider is [name] Dresser, MD Primary Cardiologist is No primary care provider on file. PCP is [name], [name] Lacks, MD  Chief Complaint  Patient presents with   Aortic Stenosis    TAVR EVAL ,,,review all completed required studies    HPI:  The patient is a 78 year old gentleman with a history of hypertension, hyperlipidemia, paroxysmal atrial fibrillation on anticoagulation, and bicuspid aortic valve with severe aortic stenosis.  He has been followed by Dr. Aundra Dubin. His last echocardiogram on 10/26/2018 showed a mean aortic valve gradient of 48 mmHg with a valve area of 0.8 cm.  Left ventricular ejection fraction was 60 to 65%.  He had a cardiac CT in February 2020 which showed a calcium score of 41 and nonobstructive coronary disease.  There was an ascending aortic aneurysm with maximum diameter of 4.3 cm.  The patient reports a several month history of dyspnea with moderate exertion and fatigue is seem to be progressing.  He has had no chest discomfort.  He denies lower extremity edema.  He has had no orthopnea or PND.  He denies any dizziness or syncope.  He was referred to Dr. Angelena Form for consideration of TAVR.  The patient is here today by himself.  He lives with his wife in [place].  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    Past Medical History:  Diagnosis Date   Acute blood loss anemia    Adenomatous colon polyp    Ascending aortic aneurysm (HCC)    Bicuspid aortic valve    Chest pain    ETT-myoview 12/11 w/exercise, no chest pain, no significant ST changes, EF 69%, no evidence for ischemia or infarction.   CHF NYHA class I (no symptoms from ordinary activities), acute, diastolic (HCC)    Fatty liver    mild   GI bleed    GI  bleeding 07/21/2018   post polypectomy   Hemorrhoids    HTN (hypertension)    Hypercholesteremia    Hypokalemia    Internal hemorrhoids    LBP (low back pain)    Moderate aortic stenosis    Osteoarthritis    Paroxysmal atrial fibrillation ([place])    a. new onset Afib in 07/2008. He underwent ibutilide cardioversion successfully. b. Recurrence 01/2013 s/p TEE/DCCV - was on Xarelto but he stopped it as he was convinced it was causing joint pn. c. Recurrence 01/2016 - spont conv to NSR. Pt took Eliquis x1 mo then declined further anticoag. d. Recurrence 07/2016.   Pneumonia    Tubular adenoma of colon     Past Surgical History:  Procedure Laterality Date   BACK SURGERY  x12 years ago   CARDIOVERSION N/A 01/26/2013   Procedure: CARDIOVERSION;  Surgeon: [name] Dresser, MD;  Location: [place];  Service: Cardiovascular;  Laterality: N/A;   CARDIOVERSION N/A 10/28/2017   Procedure: CARDIOVERSION;  Surgeon: [name] Dresser, MD;  Location: [facility] ENDOSCOPY;  Service: Cardiovascular;  Laterality: N/A;   CARDIOVERSION N/A 03/03/2018   Procedure: CARDIOVERSION;  Surgeon: Lelon Perla, MD;  Location: [facility] ENDOSCOPY;  Service: Cardiovascular;  Laterality: N/A;   COLONOSCOPY     COLONOSCOPY  07/17/2018   at [place]  RIGHT/LEFT HEART CATH AND CORONARY ANGIOGRAPHY N/A 03/07/2019   Procedure: RIGHT/LEFT HEART CATH AND CORONARY ANGIOGRAPHY;  Surgeon: Burnell Blanks, MD;  Location: [facility] CV LAB;  Service: Cardiovascular;  Laterality: N/A;   TEE WITHOUT CARDIOVERSION N/A 01/26/2013   Procedure: TRANSESOPHAGEAL ECHOCARDIOGRAM (TEE);  Surgeon: [name] Dresser, MD;  Location: [place];  Service: Cardiovascular;  Laterality: N/A;   TEE WITHOUT CARDIOVERSION N/A 10/28/2017   Procedure: TRANSESOPHAGEAL ECHOCARDIOGRAM (TEE);  Surgeon: [name] Dresser, MD;  Location: [facility] ENDOSCOPY;  Service: Cardiovascular;   Laterality: N/A;    Family History  Problem Relation Age of Onset   Colon cancer Mother 62   Hypertension Other    Coronary artery disease Neg Hx    Colon polyps Neg Hx    Esophageal cancer Neg Hx    Rectal cancer Neg Hx    Stomach cancer Neg Hx     Social History   Socioeconomic History   Marital status: Married    Spouse name: Not on file   Number of children: 0   Years of education: Not on file   Highest education level: Not on file  Occupational History   Occupation: Retired Lobbyist: [employer] resource strain: Not on file   Food insecurity    Worry: Not on file    Inability: Not on file   Transportation needs    Medical: Not on file    Non-medical: Not on file  Tobacco Use   Smoking status: Never Smoker   Smokeless tobacco: Never Used  Substance and Sexual Activity   Alcohol use: Yes    Comment: Drinks 1 glass of wine nightly/socially   Drug use: No   Sexual activity: Yes  Lifestyle   Physical activity    Days per week: Not on file    Minutes per session: Not on file   Stress: Not on file  Relationships   Social connections    Talks on phone: Not on file    Gets together: Not on file    Attends religious service: Not on file    Active member of club or organization: Not on file    Attends meetings of clubs or organizations: Not on file    Relationship status: Not on file   Intimate partner violence    Fear of current or ex partner: Not on file    Emotionally abused: Not on file    Physically abused: Not on file    Forced sexual activity: Not on file  Other Topics Concern   Not on file  Social History Narrative   Patient lives in [place] w/ his wife. He is a native of [place]. 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   Right-handed   Caffeine: 2 cups coffee per day    Current Outpatient Medications  Medication Sig Dispense Refill    famotidine (PEPCID) 40 MG tablet Take 1 tablet (40 mg total) by mouth daily. 30 tablet 11   hydrochlorothiazide (MICROZIDE) 12.5 MG capsule Take 12.5 mg by mouth daily as needed (swelling).      HYDROcodone-acetaminophen (NORCO/VICODIN) 5-325 MG tablet Take 1 tablet by mouth every 6 (six) hours as needed for severe pain. 60 tablet 0   ibuprofen (ADVIL) 200 MG tablet Take 400 mg by mouth every 6 (six) hours as needed for moderate pain.     loratadine (CLARITIN) 10 MG tablet Take 1 tablet (  10 mg total) by mouth daily. 30 tablet 11   metoprolol succinate (TOPROL-XL) 50 MG 24 hr tablet TAKE 1 TABLET (50 MG TOTAL) BY MOUTH 2 (TWO) TIMES DAILY. TAKE WITH OR IMMEDIATELY FOLLOWING A MEAL. 180 tablet 3   predniSONE (DELTASONE) 5 MG tablet Take 3 tablets (15 mg total) by mouth daily with breakfast. 90 tablet 3   Vitamin D, Cholecalciferol, 50 MCG (2000 UT) CAPS Take 2,000 Units by mouth daily.     apixaban (ELIQUIS) 5 MG TABS tablet Take 1 tablet (5 mg total) by mouth 2 (two) times daily. (Patient not taking: Reported on 03/28/2019) 60 tablet 6   No current facility-administered medications for this visit.     Allergies  Allergen Reactions   Xarelto [Rivaroxaban] Other (See Comments) and Hypertension    INCREASED BP-HYPERTENSIVE EVENTS   Corticosteroids Other (See Comments)    Made the patient  "sick," feel "weird," and his "body rejected" them    Ramipril Other (See Comments)    Could not eat or sleep, lost muscle mass   Benazepril Cough      Review of Systems:   General:  normal appetite, + decreased energy, no weight gain, no weight loss, no fever  Cardiac:  no chest pain with exertion, no chest pain at rest, +SOB with moderate exertion, no resting SOB, no PND, no orthopnea, no palpitations, + arrhythmia, + atrial fibrillation, no LE edema, no dizzy spells, no syncope  Respiratory:  + exertional shortness of breath, no home oxygen, no productive cough, no dry cough, no bronchitis,  no wheezing, no hemoptysis, no asthma, no pain with inspiration or cough, no sleep apnea, no CPAP at night  GI:   no difficulty swallowing, no reflux, no frequent heartburn, no hiatal hernia, no abdominal pain, no constipation, no diarrhea, no hematochezia, no hematemesis, no melena  GU:   no dysuria,  no frequency, no urinary tract infection, no hematuria, no enlarged prostate, no kidney stones, no kidney disease  Vascular:  no pain suggestive of claudication, no pain in feet, no leg cramps, no varicose veins, no DVT, no non-healing foot ulcer  Neuro:   no stroke, no TIA's, no seizures, no headaches, no temporary blindness one eye,  no slurred speech, no peripheral neuropathy, no chronic pain, no instability of gait, no memory/cognitive dysfunction  Musculoskeletal: no arthritis, no joint swelling, no myalgias, no difficulty walking, normal mobility   Skin:   no rash, no itching, no skin infections, no pressure sores or ulcerations  Psych:   no anxiety, no depression, no nervousness, no unusual recent stress  Eyes:   no blurry vision, no floaters, no recent vision changes, does not wear glasses or contacts  ENT:   no hearing loss, no loose or painful teeth, no dentures, sees dentist every 6 months.  Hematologic:  no easy bruising, no abnormal bleeding, no clotting disorder, no frequent epistaxis  Endocrine:  no diabetes, does not check CBG's at home     Physical Exam:   BP (!) 143/79 (BP Location: Right Arm, Patient Position: Sitting, Cuff Size: Normal)    Pulse 60    Temp 97.7 F (36.5 C) Comment: thermal   Resp 16    Ht 5' 10.5" (1.791 m)    Wt 193 lb (87.5 kg)    SpO2 96% Comment: RA   BMI 27.30 kg/m   General:  well-appearing, looks younger than age  75:  Unremarkable, NCAT, PERLA, EOMI, oropharynx clear, teeth in good codition  Neck:  no JVD, no bruits, no adenopathy or thyromegaly  Chest:   clear to auscultation, symmetrical breath sounds, no wheezes, no rhonchi   CV:   RRR, grade  III/VI crescendo/decrescendo murmur heard best at RSB,  no diastolic murmur  Abdomen:  soft, non-tender, no masses   Extremities:  warm, well-perfused, pulses palpable in feet, no LE edema  Rectal/GU  Deferred  Neuro:   Grossly non-focal and symmetrical throughout  Skin:   Clean and dry, no rashes, no breakdown   Diagnostic Tests:  Patient Name:   [facility] Date of Exam: 10/26/2018 Medical Rec #:  [id number]     Height:       70.0 in Accession #:    [id number]    Weight:       191.0 lb Date of Birth:  Oct 24, 1940     BSA:          2.05 m Patient Age:    24 years      BP:           129/70 mmHg Patient Gender: M             HR:           59 bpm. Exam Location:  Outpatient    Procedure: 2D Echo  Indications:    I35.0 Nonrheumatic aortic (valve) stenosis   History:        Patient has prior history of Echocardiogram examinations, most                 recent 10/28/2017. CAD, Atrial Fibrillation; Risk Factors:                 Hypertension and Dyslipidemia. Bicuspid aortic valve.   Sonographer:    Talmage Coin Referring Phys: [place]    1. The left ventricle has normal systolic function with an ejection fraction of 60-65%. The cavity size was normal. There is mildly increased left ventricular wall thickness. Left ventricular diastolic Doppler parameters are consistent with  pseudonormalization No evidence of left ventricular regional wall motion abnormalities.  2. The right ventricle has normal systolic function. The cavity was normal. There is no increase in right ventricular wall thickness.  3. The mitral valve is normal in structure. There is mild mitral annular calcification present. No evidence of mitral valve stenosis. Trivial regurgitation.  4. The aortic valveis bicuspid There is severe calcifcation of the aortic valve. There is severe stenosis of the aortic valve. AVA 0.8 cm^2 with mean gradient 48 mmHg.  5. The pulmonic valve was normal in  structure.  6. The aortic root and ascending aorta are normal in size and structure.  7. Right atrial pressure is estimated at 8 mmHg.  8. Left atrial size was mildly dilated.  9. Right atrial size was mildly dilated. 10. The tricuspid valve is normal in structure. 11. No evidence of left ventricular regional wall motion abnormalities. 12. The inferior vena cava was normal in size with <50% respiratory variability.  FINDINGS  Left Ventricle: The left ventricle has normal systolic function, with an ejection fraction of 60-65%. The cavity size was normal. There is mildly increased left ventricular wall thickness. Left ventricular diastolic Doppler parameters are consistent  with pseudonormalization No evidence of left ventricular regional wall motion abnormalities.. Right Ventricle: The right ventricle has normal systolic function. The cavity was normal. There is no increase in right ventricular wall thickness. Left Atrium: left atrial size was mildly dilated Right Atrium: right atrial size  was mildly dilated Right atrial pressure is estimated at 8 mmHg. Interatrial Septum: No atrial level shunt detected by color flow Doppler. Pericardium: There is no evidence of pericardial effusion. Mitral Valve: The mitral valve is normal in structure. There is mild mitral annular calcification present. Mitral valve regurgitation is trivial by color flow Doppler. No evidence of mitral valve stenosis. Tricuspid Valve: The tricuspid valve is normal in structure. Tricuspid valve regurgitation is trivial by color flow Doppler. Aortic Valve: The aortic valveis bicuspid Aortic valve regurgitation is trivial by color flow Doppler. There is severe stenosis of the aortic valve, with a calculated valve area of 0.83 cm. Pulmonic Valve: The pulmonic valve was normal in structure. Pulmonic valve regurgitation is not visualized by color flow Doppler. Aorta: The aortic root and ascending aorta are normal in size and  structure. Venous: The inferior vena cava is normal in size with less than 50% respiratory variability.   LEFT VENTRICLE PLAX 2D ([place])              Biplane EF (MOD) LV EF:          68.6 %       LV Biplane EF:   68.4 % LVIDd:          5.08 cm      LV A4C EF:       65.0 % LVIDs:          3.12 cm      LV A2C EF:       71.0 % LV PW:          1.11 cm LV IVS:         1.07 cm      Diastology LVOT diam:      1.90 cm      LV e' lateral:   7.51 cm/s LV SV:          84 ml        LV E/e' lateral: 13.2 LVOT Area:      2.84 cm     LV e' medial:    5.87 cm/s                              LV E/e' medial:  16.9 LV Volumes (MOD) LV area d, A2C:    27.40 cm LV area d, A4C:    33.30 cm LV area s, A2C:    13.30 cm LV area s, A4C:    17.40 cm LV major d, A2C:   7.89 cm LV major d, A4C:   8.09 cm LV major s, A2C:   6.50 cm LV major s, A4C:   6.46 cm LV vol d, MOD A2C: 83.5 ml LV vol d, MOD A4C: 114.0 ml LV vol s, MOD A2C: 24.2 ml LV vol s, MOD A4C: 39.9 ml LV SV MOD A2C:     59.3 ml LV SV MOD A4C:     114.0 ml LV SV MOD BP:      66.9 ml  RIGHT VENTRICLE RV S prime:     12.50 cm/s TAPSE (M-mode): 2.3 cm RVSP:           38.5 mmHg  LEFT ATRIUM             Index       RIGHT ATRIUM           Index LA diam:        4.80 cm 2.35  cm/m  RA Pressure: 8 mmHg LA Vol (A2C):   72.6 ml 35.47 ml/m RA Area:     24.20 cm LA Vol (A4C):   60.1 ml 29.36 ml/m RA Volume:   76.20 ml  37.23 ml/m LA Biplane Vol: 66.7 ml 32.59 ml/m  AORTIC VALVE AV Area (Vmax):    0.93 cm AV Area (Vmean):   0.92 cm AV Area (VTI):     0.83 cm AV Vmax:           405.75 cm/s AV Vmean:          304.250 cm/s AV VTI:            0.953 m AV Peak Grad:      65.9 mmHg AV Mean Grad:      48.0 mmHg LVOT Vmax:         133.00 cm/s LVOT Vmean:        98.500 cm/s LVOT VTI:          0.279 m LVOT/AV VTI ratio: 0.29   AORTA Ao Root diam: 3.50 cm  MITRAL VALVE              TRICUSPID VALVE MV Area (PHT): 2.37 cm   TR Peak  grad:   30.5 mmHg MV PHT:        92.80 msec TR Vmax:        276.00 cm/s MV Decel Time: 320 msec   RVSP:           38.5 mmHg MV E velocity: 99.40 cm/s MV A velocity: 76.60 cm/s MV E/A ratio:  1.30    [name] MD Electronically signed by [name] MD Signature Date/Time: 10/26/2018/6:24:47 PM    Physicians  Panel Physicians Referring Physician Case Authorizing Physician  Burnell Blanks, MD (Primary)    Procedures  RIGHT/LEFT HEART CATH AND CORONARY ANGIOGRAPHY  Conclusion  1. No angiographic evidence of CAD 2. Severe aortic stenosis ( mean gradient 49.4 mmHg, peak to peak gradient 78 mmHg).  3. Right heart cath (RA 6, RV 52/3/9, PA58/21/35, no wedge obtained due to inability to wedge the catheter)  Recommendations: Continue with TAVR workup.   Recommendations  Antiplatelet/Anticoag Continue with TAVR workup  Indications  Severe aortic stenosis [I35.0 (ICD-10-CM)]  Procedural Details  Technical Details Indication: Severe AS  Procedure: The risks, benefits, complications, treatment options, and expected outcomes were discussed with the patient. The patient and/or family concurred with the proposed plan, giving informed consent. The patient was brought to the cath lab after IV hydration was given. The patient was sedated with Versed and Fentanyl. The IV catheter in the right antecubital vein was prepped and draped and changed for a 5 French sheath. Right heart cath performed with a balloon tipped catheter. The right wrist was prepped and draped in a sterile fashion. 1% lidocaine was used for local anesthesia. Using the modified Seldinger access technique, a 5 French sheath was placed in the right radial artery. 3 mg Verapamil was given through the sheath. 4500 units IV heparin was given. Standard diagnostic catheters were used to perform selective coronary angiography. I engaged the left main with the JL3.5 but it was selective in the Circumflex so a EBU 3.5 was  used to engage the LAD. The aortic valve was crossed with an AL-1 and a straight wire. The sheath was removed from the right radial artery and a Terumo hemostasis band was applied at the arteriotomy site on the right wrist.    Estimated blood loss <  50 mL.   During this procedure medications were administered to achieve and maintain moderate conscious sedation while the patient's heart rate, blood pressure, and oxygen saturation were continuously monitored and I was present face-to-face 100% of this time.  Medications (Filter: Administrations occurring from 03/07/19 0809 to 03/07/19 1001) (important)  Continuous medications are totaled by the amount administered until 03/07/19 1001.  Medication Rate/Dose/Volume Action  Date Time   midazolam (VERSED) injection (mg) 1 mg Given 03/07/19 0905   Total dose as of 03/07/19 1001 1 mg Given 0917   2 mg        fentaNYL (SUBLIMAZE) injection (mcg) 25 mcg Given 03/07/19 0905   Total dose as of 03/07/19 1001 25 mcg Given 0917   50 mcg        lidocaine (PF) (XYLOCAINE) 1 % injection (mL) 2 mL Given 03/07/19 0916   Total dose as of 03/07/19 1001 2 mL Given 0926   4 mL        Heparin (Porcine) in NaCl 1000-0.9 UT/500ML-% SOLN (mL) 500 mL Given 03/07/19 0918   Total dose as of 03/07/19 1001 500 mL Given 0918   1,000 mL        Radial Cocktail/Verapamil only (mL) 10 mL Given 03/07/19 0928   Total dose as of 03/07/19 1001        10 mL        heparin injection (Units) 4,500 Units Given 03/07/19 0934   Total dose as of 03/07/19 1001        4,500 Units        iohexol (OMNIPAQUE) 350 MG/ML injection (mL) 80 mL Given 03/07/19 0953   Total dose as of 03/07/19 1001        80 mL        Sedation Time  Sedation Time Physician-1: 45 minutes 44 seconds  Complications  Complications documented before study signed (03/07/2019 10:04 AM)   RIGHT/LEFT HEART CATH AND CORONARY ANGIOGRAPHY  None Documented by Burnell Blanks, MD 03/07/2019 9:59 AM  Date  Found: 03/07/2019  Time Range: Intraprocedure      Coronary Findings  Diagnostic Dominance: Left Left Anterior Descending  Vessel is large.  Left Circumflex  Vessel is large.  Right Coronary Artery  Vessel is large.  Intervention  No interventions have been documented. Coronary Diagrams  Diagnostic Dominance: Left  Intervention  Implants   No implant documentation for this case.  Syngo Images  Show images for CARDIAC CATHETERIZATION  Images on Long Term Storage  Show images for [place], [place]   Link to Procedure Log  Procedure Log    Hemo Data   Most Recent Value  Fick Cardiac Output 7.73 L/min  Fick Cardiac Output Index 3.79 (L/min)/BSA  Aortic Mean Gradient 49.43 mmHg  Aortic Peak Gradient 78 mmHg  Aortic Valve Area 1.00  Aortic Value Area Index 0.49 cm2/BSA  RA A Wave 10 mmHg  RA V Wave 8 mmHg  RA Mean 6 mmHg  RV Systolic Pressure 52 mmHg  RV Diastolic Pressure 3 mmHg  RV EDP 9 mmHg  PA Systolic Pressure 56 mmHg  PA Diastolic Pressure 21 mmHg  PA Mean 35 mmHg  AO Systolic Pressure 656 mmHg  AO Diastolic Pressure 68 mmHg  AO Mean 93 mmHg  LV Systolic Pressure 812 mmHg  LV Diastolic Pressure 23 mmHg  LV EDP 29 mmHg  AOp Systolic Pressure 751 mmHg  AOp Diastolic Pressure 72 mmHg  AOp Mean Pressure 700 mmHg  LVp Systolic Pressure 174  mmHg  LVp Diastolic Pressure 22 mmHg  LVp EDP Pressure 30 mmHg  QP/QS 1  TPVR Index 9.24 HRUI  TSVR Index 24.55 HRUI  TPVR/TSVR Ratio 0.38    ADDENDUM REPORT: 03/14/2019 15:33  CLINICAL DATA:  Aortic stenosis  EXAM: Cardiac TAVR CT  TECHNIQUE: The patient was scanned on a Siemens Force 654 slice scanner. A 120 kV retrospective scan was triggered in the descending thoracic aorta at 111 HU's. Gantry rotation speed was 270 msecs and collimation was .9 mm. No beta blockade or nitro were given. The 3D data set was reconstructed in 5% intervals of the R-R cycle. Systolic and diastolic phases were analyzed  on a dedicated work station using MPR, MIP and VRT modes. The patient received 80 cc of contrast.  FINDINGS: Aortic Valve: Appears tri leaflet with only partial fusion of the left and right cusps Calcified with restricted leaflet motion  Aorta: Mild aortic root dilatation with mild calcific atherosclerosis and normal arch vessels  Sinotubular Junction: 30 mm  Ascending Thoracic Aorta: 44 mm  Aortic Arch: 25 mm  Descending Thoracic Aorta: 30 mm  Sinus of Valsalva Measurements:  Non-coronary: 32.5 mm  Right - coronary: 29.3 mm  Left - coronary: 34.3 mm  Coronary Artery Height above Annulus:  Left Main: Shallow at 8.6 mm above annulus on minimal measurement  Right Coronary: 14 mm above annulus  Virtual Basal Annulus Measurements:  Maximum/Minimum Diameter: 29 mm x 18.8 mm  Perimeter: 79 mm  Area: 435 mm2  Coronary Arteries: LM shallow  Optimum Fluoroscopic Angle for Delivery: LAO 23 Caudal 24 degrees  IMPRESSION: 1. Tri leaflet AV with partially fused left and right cusps Annular area of 435 mm2 suitable for a 26 mm Sapien 3 valve  2.  Dilated aortic root 4.4 cm  3.  Shallow LM coronary artery 8.6 mm above annulus  4. Optimum angiographic angle for deployment LAO 23 Caudal 24 degrees  Jenkins Rouge   Electronically Signed   By: Jenkins Rouge M.D.   On: 03/14/2019 15:33   Addended by Josue Hector, MD on 03/14/2019 3:35 PM    Study Result  EXAM: OVER-READ INTERPRETATION  CT CHEST  The following report is an over-read performed by radiologist Dr. Vinnie Langton of [facility], [place] on 03/14/2019. This over-read does not include interpretation of cardiac or coronary anatomy or pathology. The coronary calcium score/coronary CTA interpretation by the cardiologist is attached.  COMPARISON:  Cardiac CTA 10/24/2018.  FINDINGS: Extracardiac findings will be described separately under dictation for contemporaneously  obtained CTA chest, abdomen and pelvis.  IMPRESSION: Please see separate dictation for contemporaneously obtained CTA chest, abdomen and pelvis dated 03/14/2019 for full description of relevant extracardiac findings.  Electronically Signed: By: Vinnie Langton M.D. On: 03/14/2019 12:58      CLINICAL DATA:  78 year old male with history of severe aortic stenosis. Preprocedural study prior to potential transcatheter aortic valve replacement (TAVR) procedure.  EXAM: CT ANGIOGRAPHY CHEST, ABDOMEN AND PELVIS  TECHNIQUE: Multidetector CT imaging through the chest, abdomen and pelvis was performed using the standard protocol during bolus administration of intravenous contrast. Multiplanar reconstructed images and MIPs were obtained and reviewed to evaluate the vascular anatomy.  CONTRAST:  136m OMNIPAQUE IOHEXOL 350 MG/ML SOLN  COMPARISON:  Cardiac CTA 10/24/2018. Chest CT 10/20/2016. CT the abdomen and pelvis 12/21/2004.  FINDINGS: CTA CHEST FINDINGS  Cardiovascular: Heart size is enlarged. Concentric left ventricular hypertrophy. There is no significant pericardial fluid, thickening or pericardial calcification. There is aortic atherosclerosis,  as well as atherosclerosis of the great vessels of the mediastinum and the coronary arteries, including calcified atherosclerotic plaque in the left main, left anterior descending and right coronary arteries. Severe thickening and calcification of the aortic valve. Mild aneurysmal dilatation of the ascending thoracic aorta (4.5 cm in diameter).  Mediastinum/Lymph Nodes: No pathologically enlarged mediastinal or hilar lymph nodes. Small hiatal hernia. No axillary lymphadenopathy.  Lungs/Pleura: Areas of mild linear scarring in the mid lungs bilaterally. No acute consolidative airspace disease. No pleural effusions. A few scattered 2-3 mm pulmonary nodules are noted in the lungs bilaterally, nonspecific. No other larger  more suspicious appearing pulmonary nodules or masses are noted.  Musculoskeletal/Soft Tissues: There are no aggressive appearing lytic or blastic lesions noted in the visualized portions of the skeleton.  CTA ABDOMEN AND PELVIS FINDINGS  Hepatobiliary: No suspicious cystic or solid hepatic lesions. No intra or extrahepatic biliary ductal dilatation. Gallbladder is normal in appearance.  Pancreas: No pancreatic mass. No pancreatic ductal dilatation. No pancreatic or peripancreatic fluid or inflammatory changes.  Spleen: Unremarkable.  Adrenals/Urinary Tract: Low-attenuation lesions in both kidneys, compatible with simple cysts, largest of which is in the interpolar region of the left kidney measuring 3.4 cm in diameter. Other subcentimeter low-attenuation lesions in the right kidney are too small to definitively characterize, but are statistically likely to represent tiny cysts. Bilateral adrenal glands are normal in appearance. No hydroureteronephrosis. Urinary bladder is mildly trabeculated with multiple small bladder wall diverticuli.  Stomach/Bowel: Normal appearance of the stomach. No pathologic dilatation of small bowel or colon. Normal appendix.  Vascular/Lymphatic: Aortic atherosclerosis, without evidence of aneurysm or dissection in the abdominal or pelvic vasculature. Vascular findings and measurements pertinent to potential TAVR procedure, as detailed below. No lymphadenopathy noted in the abdomen or pelvis.  Reproductive: Prostate gland and seminal vesicles are unremarkable in appearance.  Other: No significant volume of ascites.  No pneumoperitoneum.  Musculoskeletal: Unusual lesions in the iliac bones bilaterally, and in the right ischium. These range from centrally lucent to sclerotic. The largest lesion is in the right ilium above the right acetabulum measuring at least 6.3 x 3.8 cm with central enhancing soft tissue or other high attenuation  material. The lesion in the anterior aspect of the left ilium has central fatty attenuation (axial image 194 of series 15). The lesion in the right ischium demonstrates cortical breakthrough (axial image 207 of series 15).  VASCULAR MEASUREMENTS PERTINENT TO TAVR:  AORTA:  Minimal Aortic Diameter-14 x 13 mm  Severity of Aortic Calcification-moderate  RIGHT PELVIS:  Right Common Iliac Artery -  Minimal Diameter-10.7 x 10.0 mm  Tortuosity-mild  Calcification-mild  Right External Iliac Artery -  Minimal Diameter-7.7 x 7.9 mm  Tortuosity-severe  Calcification-none  Right Common Femoral Artery -  Minimal Diameter-8.0 x 8.2 mm  Tortuosity-mild  Calcification-minimal  LEFT PELVIS:  Left Common Iliac Artery -  Minimal Diameter-10.3 x 9.6 mm  Tortuosity-mild  Calcification-mild  Left External Iliac Artery -  Minimal Diameter-8.5 x 7.8 mm  Tortuosity-moderate to severe  Calcification-none  Left Common Femoral Artery -  Minimal Diameter-8.2 x 8.1 mm  Tortuosity-mild  Calcification-minimal  Review of the MIP images confirms the above findings.  IMPRESSION: 1. Vascular findings and measurements pertinent to potential TAVR procedure, as detailed above. 2. Severe thickening calcification of the aortic valve, compatible with the reported clinical history of severe aortic stenosis. 3. Aortic atherosclerosis, in addition to left main and 2 vessel coronary artery disease. In addition, there is mild aneurysmal dilatation  of the ascending thoracic aorta (4.5 cm in diameter. Ascending thoracic aortic aneurysm. Recommend semi-annual imaging followup by CTA or MRA and referral to cardiothoracic surgery if not already obtained. This recommendation follows 2010 ACCF/AHA/AATS/ACR/ASA/SCA/SCAI/SIR/STS/SVM Guidelines for the Diagnosis and Management of Patients With Thoracic Aortic Disease. Circulation. 2010; 121: L875-I433. Aortic  aneurysm NOS (ICD10-I71.9) 4. Indeterminate osseous lesions in the bony pelvis which warrants further characterization with nonemergent MRI of the pelvis with and without IV gadolinium in the near future to exclude an aggressive process such as multiple myeloma or metastatic disease. 5. Cardiomegaly with concentric left ventricular hypertrophy. 6. Scattered 2-3 mm pulmonary nodules in the lungs bilaterally, nonspecific but statistically likely benign. No follow-up needed if patient is low-risk (and has no known or suspected primary neoplasm). Non-contrast chest CT can be considered in 12 months if patient is high-risk. This recommendation follows the consensus statement: Guidelines for Management of Incidental Pulmonary Nodules Detected on CT Images: From the Fleischner Society 2017; Radiology 2017; 284:228-243.   Electronically Signed   By: Vinnie Langton M.D.   On: 03/14/2019 13:40  STS Risk Score: AVR Risk of Mortality: 1.281% Renal Failure: 1.189% Permanent Stroke: 0.761% Prolonged Ventilation: 3.997% DSW Infection: 0.108% Reoperation: 3.223% Morbidity or Mortality: 7.573% Short Length of Stay: 41.333% Long Length of Stay: 3.892%  Impression:  This 78 year old gentleman has stage D, severe, symptomatic aortic stenosis with New York Heart Association class II symptoms of progressive exertional fatigue and shortness of breath consistent with chronic diastolic congestive heart failure.  I have personally reviewed his 2D echocardiogram, cardiac catheterization, and CTA studies.  His echocardiogram shows a bicuspid aortic valve with calcified leaflets and restricted mobility.  The mean gradient across aortic valve is 48 mmHg consistent with severe aortic stenosis.  Left ventricular systolic function is normal.  Cardiac catheterization shows no coronary disease.  The mean gradient across aortic valve was 49 mmHg consistent with severe aortic stenosis.  I agree that aortic  valve replacement is indicated in this active patient to prevent progressive functional decline and left ventricular deterioration.  Given his age I think transcatheter aortic valve replacement would be the best option for him.  His gated cardiac CTA shows anatomy suitable for transcatheter aortic valve replacement although his left main minimum height was only measured at 8.6 mm.  We will need to review this measurement.  His abdominal and pelvic CTA shows adequate pelvic vascular anatomy to allow transfemoral insertion.  The patient was counseled at length regarding treatment alternatives for management of severe symptomatic aortic stenosis. The risks and benefits of surgical intervention has been discussed in detail. Long-term prognosis with medical therapy was discussed. Alternative approaches such as conventional surgical aortic valve replacement, transcatheter aortic valve replacement, and palliative medical therapy were compared and contrasted at length. This discussion was placed in the context of the patient's own specific clinical presentation and past medical history. All of his questions have been addressed.   Following the decision to proceed with transcatheter aortic valve replacement, a discussion was held regarding what types of management strategies would be attempted intraoperatively in the event of life-threatening complications, including whether or not the patient would be considered a candidate for the use of cardiopulmonary bypass and/or conversion to open sternotomy for attempted surgical intervention.  He is in overall good condition for his age and has low surgical risk for open surgical aortic valve replacement so I think he would be a candidate for emergent sternotomy to manage any intraoperative complications.  The patient  has been advised of a variety of complications that might develop including but not limited to risks of death, stroke, paravalvular leak, aortic dissection or  other major vascular complications, aortic annulus rupture, device embolization, cardiac rupture or perforation, mitral regurgitation, acute myocardial infarction, arrhythmia, heart block or bradycardia requiring permanent pacemaker placement, congestive heart failure, respiratory failure, renal failure, pneumonia, infection, other late complications related to structural valve deterioration or migration, or other complications that might ultimately cause a temporary or permanent loss of functional independence or other long term morbidity. The patient provides full informed consent for the procedure as described and all questions were answered.     Plan:  He will be scheduled for transfemoral transcatheter aortic valve replacement on Tuesday, 04/03/2019.  He is currently off Eliquis.   I spent 60 minutes performing this consultation and > 50% of this time was spent face to face counseling and coordinating the care of this patient's severe symptomatic aortic stenosis.   Gaye Pollack, MD 03/28/2019 2:44 PM
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
## 2019-03-29 NOTE — Pre-Procedure Instructions (Signed)
Vaun Otter  03/29/2019     Your procedure is scheduled on Tuesday, July 21.  Report to [facility], Main Entrance or Entrance "A" at 10:15 A.M.   Call this number if you have problems the morning of [phone]  This is the number for the Pre- Surgical Desk.    Remember:  Do not eat or drink after midnight.   Take these medicines the morning of surgery with A SIP OF WATER : None  Special instructions:   [place]- Preparing For Surgery  Before surgery, you can play an important role. Because skin is not sterile, your skin needs to be as free of germs as possible. You can reduce the number of germs on your skin by washing with CHG (chlorahexidine gluconate) Soap before surgery.  CHG is an antiseptic cleaner which kills germs and bonds with the skin to continue killing germs even after washing.    Oral Hygiene is also important to reduce your risk of infection.  Remember - BRUSH YOUR TEETH THE MORNING OF SURGERY WITH YOUR REGULAR TOOTHPASTE  Please do not use if you have an allergy to CHG or antibacterial soaps. If your skin becomes reddened/irritated stop using the CHG.  Do not shave (including legs and underarms) for at least 48 hours prior to first CHG shower. It is OK to shave your face.  Please follow these instructions carefully.   1. Shower the NIGHT BEFORE SURGERY and the MORNING OF SURGERY with CHG.   2. If you chose to wash your hair, wash your hair first as usual with your normal shampoo.  3. After you shampoo, wash your face and private area with the soap you use at home, then rinse your hair and body thoroughly to remove the shampoo and soap..  4. Use CHG as you would any other liquid soap. You can apply CHG directly to the skin and wash gently with a scrungie or a clean washcloth.   5. Apply the CHG Soap to your body ONLY FROM THE NECK DOWN.  Do not use on open wounds or open sores. Avoid contact with your eyes, ears, mouth and genitals  (private parts).  6. Wash thoroughly, paying special attention to the area where your surgery will be performed.  7. Thoroughly rinse your body with warm water from the neck down.  8. DO NOT shower/wash with your normal soap after using and rinsing off the CHG Soap.  9. Pat yourself dry with a CLEAN TOWEL.  10. Wear CLEAN PAJAMAS to bed the night before surgery, wear comfortable clothes the morning of surgery  11. Place CLEAN SHEETS on your bed the night of your first shower and DO NOT SLEEP WITH PETS.  Day of Surgery: Shower as instructed above.  Do not wear lotions, powders, or perfumes, or deodorant. Please wear clean clothes to the hospital/surgery center.   Remember to brush your teeth WITH YOUR REGULAR TOOTHPASTE.  Do not wear jewelry, make-up or nail polish.  Do not shave 48 hours prior to surgery.  Men may shave face and neck.  Do not bring valuables to the hospital.  [facility] is not responsible for any belongings or valuables.  Contacts, dentures or bridgework may not be worn into surgery.  Leave your suitcase in the car.  After surgery it may be brought to your room.  For patients admitted to the hospital, discharge time will be determined by your treatment team.  Please read over the following fact sheets  that you were given: Pain Booklet, Patient Instructions for Mupirocin Application, Coughing and Deep Breathing, Surgical Site Infections.

## 2019-03-30 ENCOUNTER — Other Ambulatory Visit (HOSPITAL_COMMUNITY): Payer: Medicare Other

## 2019-03-30 ENCOUNTER — Other Ambulatory Visit: Payer: Self-pay | Admitting: Physician Assistant

## 2019-03-30 ENCOUNTER — Telehealth: Payer: Self-pay | Admitting: Physician Assistant

## 2019-03-30 ENCOUNTER — Inpatient Hospital Stay (HOSPITAL_COMMUNITY)
Admission: RE | Admit: 2019-03-30 | Discharge: 2019-03-30 | Disposition: A | Discharge status: Home / Self Care | Payer: Medicare Other | Source: Ambulatory Visit

## 2019-03-30 DIAGNOSIS — R937 Abnormal findings on diagnostic imaging of other parts of musculoskeletal system: Secondary | ICD-10-CM

## 2019-03-30 NOTE — Telephone Encounter (Signed)
  HE[facility] AND VASCULAR CENTER   MULTIDISCIPLINARY HEART VALVE TEAM   Mr. [name] [name] was scheduled for TAVR on 04/03/19. Unfortunately, MRI from 03/28/19 showed multiple destructive bony lesions in his pelvis. I discussed case with Dr. Cyndia Bent and plan will be to cancel TAVR and proceed with further work up with a full body PET-CT scan. I have arranged this for next Friday 7/24. I have asked him to resume his anticoagulation with Eliquis.   Angelena Form PA-C  [facility]

## 2019-04-02 NOTE — Telephone Encounter (Signed)
Thanks

## 2019-04-03 ENCOUNTER — Encounter (HOSPITAL_COMMUNITY): Admission: RE | Payer: Self-pay | Source: Home / Self Care

## 2019-04-03 ENCOUNTER — Inpatient Hospital Stay (HOSPITAL_COMMUNITY): Admission: RE | Admit: 2019-04-03 | Payer: Medicare Other | Source: Home / Self Care | Admitting: Cardiovascular Disease

## 2019-04-03 SURGERY — IMPLANTATION, AORTIC VALVE, TRANSCATHETER, FEMORAL APPROACH
Anesthesia: General

## 2019-04-05 ENCOUNTER — Other Ambulatory Visit: Payer: Self-pay | Admitting: Physician Assistant

## 2019-04-05 ENCOUNTER — Telehealth: Payer: Self-pay | Admitting: Physician Assistant

## 2019-04-05 MED ORDER — AMIODARONE HCL 200 MG PO TABS: 200.0000 mg | ORAL_TABLET | Freq: Two times a day (BID) | ORAL | 0 refills | Status: DC

## 2019-04-05 NOTE — Telephone Encounter (Signed)
Please make sure that he has a TEE-guided DCCV set up with me next week and that he is taking his Eliquis regularly and has started amiodarone 200 mg bid.

## 2019-04-05 NOTE — Telephone Encounter (Signed)
  HEART AND VASCULAR CENTER   MULTIDISCIPLINARY HEART VALVE TEAM  Mr. Liszewski is well-known to the structural heart team as well as advanced heart failure with Dr. Aundra Dubin.  He was scheduled for TAVR this week but this was canceled due to the finding of bony lytic lesion on his pre-TAVR CT scan which will require further follow-up.  He is scheduled for a full body PET scan tomorrow.  He called into the structural heart office to report palpitations and his heart racing typical of his atrial fibrillation.  His heart rates get up to 125 bpm and he generally feels unwell.  He tried doubling his dose of Toprol yesterday but this caused hypotension.  Unfortunately, he discontinued his Eliquis last week in anticipation of TAVR.  He has now been back on it without any interruption.  I discussed case with Dr. Aundra Dubin, we will call in amiodarone 200 mg twice a day.  We have asked him to try to complete his PET scan tomorrow and will plan for TEE/DCCV next week with Dr. Aundra Dubin.  I have called Heather with the advanced heart failure clinic and she will help me to set this up   Angelena Form PA-C  [facility]

## 2019-04-06 ENCOUNTER — Other Ambulatory Visit (HOSPITAL_COMMUNITY): Payer: Medicare Other

## 2019-04-06 ENCOUNTER — Other Ambulatory Visit: Payer: Self-pay

## 2019-04-06 ENCOUNTER — Encounter (HOSPITAL_COMMUNITY)
Admission: RE | Admit: 2019-04-06 | Discharge: 2019-04-06 | Disposition: A | Discharge status: Home / Self Care | Payer: Medicare Other | Source: Ambulatory Visit | Attending: Physician Assistant | Admitting: Physician Assistant

## 2019-04-06 ENCOUNTER — Other Ambulatory Visit (HOSPITAL_COMMUNITY): Payer: Self-pay

## 2019-04-06 ENCOUNTER — Telehealth (HOSPITAL_COMMUNITY): Payer: Self-pay

## 2019-04-06 ENCOUNTER — Ambulatory Visit (HOSPITAL_COMMUNITY)
Admission: RE | Admit: 2019-04-06 | Discharge: 2019-04-06 | Disposition: A | Discharge status: Home / Self Care | Payer: Medicare Other | Source: Ambulatory Visit | Attending: Internal Medicine | Admitting: Internal Medicine

## 2019-04-06 DIAGNOSIS — I4819 Other persistent atrial fibrillation: Secondary | ICD-10-CM | POA: Diagnosis not present

## 2019-04-06 DIAGNOSIS — C801 Malignant (primary) neoplasm, unspecified: Secondary | ICD-10-CM | POA: Diagnosis not present

## 2019-04-06 DIAGNOSIS — R937 Abnormal findings on diagnostic imaging of other parts of musculoskeletal system: Secondary | ICD-10-CM | POA: Diagnosis not present

## 2019-04-06 DIAGNOSIS — C7951 Secondary malignant neoplasm of bone: Secondary | ICD-10-CM | POA: Diagnosis not present

## 2019-04-06 LAB — CBC
HCT: 47.4 % (ref 39.0–52.0)
Hemoglobin: 15 g/dL (ref 13.0–17.0)
MCH: 30.9 pg (ref 26.0–34.0)
MCHC: 31.6 g/dL (ref 30.0–36.0)
MCV: 97.7 fL (ref 80.0–100.0)
Platelets: 208 10*3/uL (ref 150–400)
RBC: 4.85 MIL/uL (ref 4.22–5.81)
RDW: 14.3 % (ref 11.5–15.5)
WBC: 6.9 10*3/uL (ref 4.0–10.5)
nRBC: 0 % (ref 0.0–0.2)

## 2019-04-06 LAB — BASIC METABOLIC PANEL
Anion gap: 11 (ref 5–15)
BUN: 18 mg/dL (ref 8–23)
CO2: 26 mmol/L (ref 22–32)
Calcium: 9.7 mg/dL (ref 8.9–10.3)
Chloride: 103 mmol/L (ref 98–111)
Creatinine, Ser: 0.98 mg/dL (ref 0.61–1.24)
GFR calc Af Amer: >60 mL/min (ref >60)
GFR calc non Af Amer: >60 mL/min (ref >60)
Glucose, Bld: 129 mg/dL — ABNORMAL HIGH (ref 70–99)
Potassium: 4 mmol/L (ref 3.5–5.1)
Sodium: 140 mmol/L (ref 135–145)

## 2019-04-06 LAB — GLUCOSE, CAPILLARY: Glucose-Capillary: 138 mg/dL — ABNORMAL HIGH (ref 70–99)

## 2019-04-06 LAB — PROTIME-INR
INR: 1.2 (ref 0.8–1.2)
Prothrombin Time: 15 seconds (ref 11.4–15.2)

## 2019-04-06 MED ORDER — FLUDEOXYGLUCOSE F - 18 (FDG) INJECTION
9.6200 | Freq: Once | INTRAVENOUS | Status: AC | PRN
  Administered 2019-04-06: 9.62 via INTRAVENOUS

## 2019-04-06 NOTE — Telephone Encounter (Signed)
Pt presented to office for labs. Verbal and written Instructions provided to patient for TEE/DCCV.  Questions answered

## 2019-04-06 NOTE — Telephone Encounter (Signed)
Called patient to arrange DCCV/TEE next week. Pt will come to office for labs/instructions.  Pt set up for covid screen today as well at [facility]. Pt understands he has to remain quarantined until date of procedure.  Verbalized understanding.

## 2019-04-07 ENCOUNTER — Other Ambulatory Visit (HOSPITAL_COMMUNITY): Payer: Medicare Other | Attending: Cardiology

## 2019-04-09 ENCOUNTER — Other Ambulatory Visit: Payer: Self-pay | Admitting: Physician Assistant

## 2019-04-09 ENCOUNTER — Telehealth: Payer: Self-pay | Admitting: Physician Assistant

## 2019-04-09 DIAGNOSIS — R948 Abnormal results of function studies of other organs and systems: Secondary | ICD-10-CM

## 2019-04-09 NOTE — Telephone Encounter (Signed)
  [place] VALVE TEAM   Patient called the structural heart office to report that he self converted from afib. Will cancel TEE/DCCV on Wednesday and reduce amio to 200 mg daily. I asked him to come in for a confirmatory ECG but patient reluctant and says he is quite sure he is no longer in afib as his pulse is back in the 50s vs 120s and he is feeling much better.   Angelena Form PA-C  [facility]

## 2019-04-09 NOTE — Progress Notes (Signed)
[place]

## 2019-04-11 ENCOUNTER — Encounter (HOSPITAL_COMMUNITY): Admission: RE | Payer: Self-pay | Source: Home / Self Care

## 2019-04-11 ENCOUNTER — Ambulatory Visit (HOSPITAL_COMMUNITY): Admission: RE | Admit: 2019-04-11 | Payer: Medicare Other | Source: Home / Self Care | Admitting: Cardiology

## 2019-04-11 SURGERY — ECHOCARDIOGRAM, TRANSESOPHAGEAL
Anesthesia: Monitor Anesthesia Care

## 2019-04-12 ENCOUNTER — Other Ambulatory Visit: Payer: Self-pay | Admitting: Physician Assistant

## 2019-04-12 NOTE — Telephone Encounter (Signed)
I called it in last week for #30. He was supposed to take it twice a day but then we reduced it to 200mg  daily. He should not have run out yet. I am not sure if he will stay on this long term so I am not going to refill at this time.

## 2019-04-13 ENCOUNTER — Other Ambulatory Visit: Payer: Self-pay | Admitting: Student

## 2019-04-14 HISTORY — PX: OTHER SURGICAL HISTORY: SHX169

## 2019-04-16 ENCOUNTER — Other Ambulatory Visit: Payer: Self-pay

## 2019-04-16 ENCOUNTER — Ambulatory Visit (HOSPITAL_COMMUNITY)
Admission: RE | Admit: 2019-04-16 | Discharge: 2019-04-16 | Disposition: A | Discharge status: Home / Self Care | Payer: Medicare Other | Source: Ambulatory Visit | Attending: Physician Assistant | Admitting: Physician Assistant

## 2019-04-16 ENCOUNTER — Encounter (HOSPITAL_COMMUNITY): Payer: Self-pay

## 2019-04-16 DIAGNOSIS — R948 Abnormal results of function studies of other organs and systems: Secondary | ICD-10-CM

## 2019-04-16 DIAGNOSIS — Z7901 Long term (current) use of anticoagulants: Secondary | ICD-10-CM | POA: Insufficient documentation

## 2019-04-16 DIAGNOSIS — M899 Disorder of bone, unspecified: Secondary | ICD-10-CM | POA: Diagnosis not present

## 2019-04-16 DIAGNOSIS — I10 Essential (primary) hypertension: Secondary | ICD-10-CM | POA: Diagnosis not present

## 2019-04-16 DIAGNOSIS — D492 Neoplasm of unspecified behavior of bone, soft tissue, and skin: Secondary | ICD-10-CM | POA: Diagnosis not present

## 2019-04-16 DIAGNOSIS — Z79899 Other long term (current) drug therapy: Secondary | ICD-10-CM | POA: Diagnosis not present

## 2019-04-16 DIAGNOSIS — Z888 Allergy status to other drugs, medicaments and biological substances status: Secondary | ICD-10-CM | POA: Diagnosis not present

## 2019-04-16 DIAGNOSIS — C9030 Solitary plasmacytoma not having achieved remission: Secondary | ICD-10-CM | POA: Insufficient documentation

## 2019-04-16 DIAGNOSIS — Z800 Family history of malignant neoplasm of digestive organs: Secondary | ICD-10-CM | POA: Diagnosis not present

## 2019-04-16 DIAGNOSIS — I480 Paroxysmal atrial fibrillation: Secondary | ICD-10-CM | POA: Insufficient documentation

## 2019-04-16 DIAGNOSIS — E7800 Pure hypercholesterolemia, unspecified: Secondary | ICD-10-CM | POA: Insufficient documentation

## 2019-04-16 DIAGNOSIS — M1990 Unspecified osteoarthritis, unspecified site: Secondary | ICD-10-CM | POA: Insufficient documentation

## 2019-04-16 DIAGNOSIS — M898X8 Other specified disorders of bone, other site: Secondary | ICD-10-CM | POA: Diagnosis not present

## 2019-04-16 LAB — CBC
HCT: 43.9 % (ref 39.0–52.0)
Hemoglobin: 14.3 g/dL (ref 13.0–17.0)
MCH: 30.9 pg (ref 26.0–34.0)
MCHC: 32.6 g/dL (ref 30.0–36.0)
MCV: 94.8 fL (ref 80.0–100.0)
Platelets: 203 10*3/uL (ref 150–400)
RBC: 4.63 MIL/uL (ref 4.22–5.81)
RDW: 13.6 % (ref 11.5–15.5)
WBC: 7.6 10*3/uL (ref 4.0–10.5)
nRBC: 0 % (ref 0.0–0.2)

## 2019-04-16 LAB — PROTIME-INR
INR: 1.2 (ref 0.8–1.2)
Prothrombin Time: 14.9 seconds (ref 11.4–15.2)

## 2019-04-16 MED ORDER — MIDAZOLAM HCL 2 MG/2ML IJ SOLN
INTRAMUSCULAR | Status: AC | PRN
  Administered 2019-04-16: 0.5 mg via INTRAVENOUS
  Administered 2019-04-16: 1 mg via INTRAVENOUS

## 2019-04-16 MED ORDER — FENTANYL CITRATE (PF) 100 MCG/2ML IJ SOLN
INTRAMUSCULAR | Status: AC | PRN
  Administered 2019-04-16: 25 ug via INTRAVENOUS
  Administered 2019-04-16: 50 ug via INTRAVENOUS

## 2019-04-16 MED ORDER — LIDOCAINE HCL 1 % IJ SOLN
INTRAMUSCULAR | Status: AC
  Filled 2019-04-16: qty 20

## 2019-04-16 MED ORDER — FENTANYL CITRATE (PF) 100 MCG/2ML IJ SOLN
INTRAMUSCULAR | Status: AC
  Filled 2019-04-16: qty 2

## 2019-04-16 MED ORDER — SODIUM CHLORIDE 0.9 % IV SOLN: INTRAVENOUS | Status: DC

## 2019-04-16 MED ORDER — MIDAZOLAM HCL 2 MG/2ML IJ SOLN
INTRAMUSCULAR | Status: AC
  Filled 2019-04-16: qty 2

## 2019-04-16 NOTE — Discharge Instructions (Signed)
Needle Biopsy, Care After °This sheet gives you information about how to care for yourself after your procedure. Your health care provider may also give you more specific instructions. If you have problems or questions, contact your health care provider. °What can I expect after the procedure? °After the procedure, it is common to have soreness, bruising, or mild pain at the puncture site. This should go away in a few days. °Follow these instructions at home: °Needle insertion site care ° °· Wash your hands with soap and water before you change your bandage (dressing). If you cannot use soap and water, use hand sanitizer. °· Follow instructions from your health care provider about how to take care of your puncture site. This includes: °? When and how to change your dressing. °? When to remove your dressing. °· Check your puncture site every day for signs of infection. Check for: °? Redness, swelling, or pain. °? Fluid or blood. °? Pus or a bad smell. °? Warmth. °General instructions °· Return to your normal activities as told by your health care provider. Ask your health care provider what activities are safe for you. °· Do not take baths, swim, or use a hot tub until your health care provider approves. Ask your health care provider if you may take showers. You may only be allowed to take sponge baths. °· Take over-the-counter and prescription medicines only as told by your health care provider. °· Keep all follow-up visits as told by your health care provider. This is important. °Contact a health care provider if: °· You have a fever. °· You have redness, swelling, or pain at the puncture site that lasts longer than a few days. °· You have fluid, blood, or pus coming from your puncture site. °· Your puncture site feels warm to the touch. °Get help right away if: °· You have severe bleeding from the puncture site. °Summary °· After the procedure, it is common to have soreness, bruising, or mild pain at the puncture  site. This should go away in a few days. °· Check your puncture site every day for signs of infection, such as redness, swelling, or pain. °· Get help right away if you have severe bleeding from your puncture site. °This information is not intended to replace advice given to you by your health care provider. Make sure you discuss any questions you have with your health care provider. °Document Released: 01/14/2015 Document Revised: 11/11/2017 Document Reviewed: 09/12/2017 °Elsevier Patient Education © 2020 [employer] Inc[employer] ° °

## 2019-04-16 NOTE — H&P (Signed)
Chief Complaint: Patient was seen in consultation today for right posterior acetabular lesion bx at the request of Thompson,Kathryn R  Referring Physician(s): Eileen Stanford  Supervising Physician: Corrie Mckusick  Patient Status: [facility] - Out-pt  History of Present Illness: [name] [name] is a 78 y.o. male   Known CAD Afib-- Eliquis-- LD last week  Was scheduled for TAVR on 7/21 Imaging done in preparation for same--- revealed abnormal findings TAVR cancelled per Dr Cyndia Bent-- work up of bony lesions pursued:   MR done 7/16 revealed:IMPRESSION: 1. Destructive bone lesions as detailed above. Findings most consistent with metastatic disease. PET-CT may be helpful for further evaluation and to establish a primary tumor. The right pelvic bone lesions should be amenable to image guided biopsy but a PET scan may demonstrate easier/safer biopsy sites. 2. No intrapelvic mass or adenopathy. 3. Benign intraosseous lipoma involving the left anterior superior Acetabulum.  04/06/19: PET: IMPRESSION: 1. Diffuse osseous metastatic disease as detailed above without findings for a primary neoplasm in the chest, abdomen or pelvis. The large destructive lesion involving the right ischium should be amenable to image guided biopsy. 2. Two small retroperitoneal lymph nodes and 1 small right obturator node showing hypermetabolism.  Now scheduled for Right posterior acetabular lesion biopsy  Past Medical History:  Diagnosis Date   Acute blood loss anemia    Adenomatous colon polyp    Ascending aortic aneurysm (HCC)    Bicuspid aortic valve    Chest pain    ETT-myoview 12/11 w/exercise, no chest pain, no significant ST changes, EF 69%, no evidence for ischemia or infarction.   CHF NYHA class I (no symptoms from ordinary activities), acute, diastolic (HCC)    Fatty liver    mild   GI bleed    GI bleeding 07/21/2018   post polypectomy   Hemorrhoids    HTN (hypertension)     Hypercholesteremia    Hypokalemia    Internal hemorrhoids    LBP (low back pain)    Moderate aortic stenosis    Osteoarthritis    Paroxysmal atrial fibrillation ([place])    a. new onset Afib in 07/2008. He underwent ibutilide cardioversion successfully. b. Recurrence 01/2013 s/p TEE/DCCV - was on Xarelto but he stopped it as he was convinced it was causing joint pn. c. Recurrence 01/2016 - spont conv to NSR. Pt took Eliquis x1 mo then declined further anticoag. d. Recurrence 07/2016.   Pneumonia    Tubular adenoma of colon     Past Surgical History:  Procedure Laterality Date   BACK SURGERY  x12 years ago   CARDIOVERSION N/A 01/26/2013   Procedure: CARDIOVERSION;  Surgeon: Larey Dresser, MD;  Location: [place];  Service: Cardiovascular;  Laterality: N/A;   CARDIOVERSION N/A 10/28/2017   Procedure: CARDIOVERSION;  Surgeon: Larey Dresser, MD;  Location: [facility] ENDOSCOPY;  Service: Cardiovascular;  Laterality: N/A;   CARDIOVERSION N/A 03/03/2018   Procedure: CARDIOVERSION;  Surgeon: Lelon Perla, MD;  Location: [facility] ENDOSCOPY;  Service: Cardiovascular;  Laterality: N/A;   COLONOSCOPY     COLONOSCOPY  07/17/2018   at [place]     RIGHT/LEFT HEART CATH AND CORONARY ANGIOGRAPHY N/A 03/07/2019   Procedure: RIGHT/LEFT HEART CATH AND CORONARY ANGIOGRAPHY;  Surgeon: Burnell Blanks, MD;  Location: [place] CV LAB;  Service: Cardiovascular;  Laterality: N/A;   TEE WITHOUT CARDIOVERSION N/A 01/26/2013   Procedure: TRANSESOPHAGEAL ECHOCARDIOGRAM (TEE);  Surgeon:  Larey Dresser, MD;  [facility] END[facility];  Service: Cardiovascular;  Laterality: N/A;   TEE WITHOUT CARDIOVERSION N/A 10/28/2017   Procedure: TRANSESOPHAGEAL ECHOCARDIOGRAM (TEE);  Surgeon: Larey Dresser, MD;  Location: [facility] ENDOSCOPY;  Service: Cardiovascular;  Laterality: N/A;    Allergies: Xarelto [rivaroxaban], Corticosteroids, Ramipril, and  Benazepril  Medications: Prior to Admission medications   Medication Sig Start Date End Date Taking? Authorizing Provider  apixaban (ELIQUIS) 5 MG TABS tablet Take 1 tablet (5 mg total) by mouth 2 (two) times daily. 10/13/18  Yes Larey Dresser, MD  famotidine (PEPCID) 40 MG tablet Take 1 tablet (40 mg total) by mouth daily. Patient taking differently: Take 40 mg by mouth at bedtime.  01/29/19  Yes [name], [name], MD  hydrochlorothiazide (MICROZIDE) 12.5 MG capsule Take 12.5 mg by mouth every evening.    Yes [provider]  HYDROcodone-acetaminophen (NORCO/VICODIN) 5-325 MG tablet Take 1 tablet by mouth every 6 (six) hours as needed for severe pain. 03/19/19 03/18/20 Yes [name], [name], MD  ibuprofen (ADVIL) 200 MG tablet Take 400 mg by mouth every 8 (eight) hours as needed for moderate pain.    Yes [provider]  loratadine (CLARITIN) 10 MG tablet Take 1 tablet (10 mg total) by mouth daily. 12/11/18  Yes [name], [name], MD  metoprolol succinate (TOPROL-XL) 50 MG 24 hr tablet TAKE 1 TABLET (50 MG TOTAL) BY MOUTH 2 (TWO) TIMES DAILY. TAKE WITH OR IMMEDIATELY FOLLOWING A MEAL. 01/10/19  Yes Larey Dresser, MD  predniSONE (DELTASONE) 5 MG tablet Take 3 tablets (15 mg total) by mouth daily with breakfast. 01/29/19  Yes [name], [name], MD  Vitamin D, Cholecalciferol, 50 MCG (2000 UT) CAPS Take 2,000 Units by mouth daily.   Yes [provider]  amiodarone (PACERONE) 200 MG tablet Take 1 tablet (200 mg total) by mouth 2 (two) times a day. Patient not taking: Reported on 04/12/2019 04/05/19   Eileen Stanford, PA-C     Family History  Problem Relation Age of Onset   Colon cancer Mother 95   Hypertension Other    Coronary artery disease Neg Hx    Colon polyps Neg Hx    Esophageal cancer Neg Hx    Rectal cancer Neg Hx    Stomach cancer Neg Hx     Social History   Socioeconomic History   Marital status: Married    Spouse name: Not on  file   Number of children: 0   Years of education: Not on file   Highest education level: Not on file  Occupational History   Occupation: Retired Lobbyist: [employer] resource strain: Not on file   Food insecurity    Worry: Not on file    Inability: Not on file   Transportation needs    Medical: Not on file    Non-medical: Not on file  Tobacco Use   Smoking status: Never Smoker   Smokeless tobacco: Never Used  Substance and Sexual Activity   Alcohol use: Yes    Comment: Drinks 1 glass of wine nightly/socially   Drug use: No   Sexual activity: Yes  Lifestyle   Physical activity    Days per week: Not on file    Minutes per session: Not on file   Stress: Not on file  Relationships   Social connections    Talks on phone: Not on file    Gets together:  Not on file    Attends religious service: Not on file    Active member of club or organization: Not on file    Attends meetings of clubs or organizations: Not on file    Relationship status: Not on file  Other Topics Concern   Not on file  Social History Narrative   Patient lives in [place] w/ his wife. He is a native of [place]. 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   Right-handed   Caffeine: 2 cups coffee per day    Review of Systems: A 12 point ROS discussed and pertinent positives are indicated in the HPI above.  All other systems are negative.  Review of Systems  Constitutional: Positive for activity change. Negative for appetite change, fatigue, fever and unexpected weight change.  Respiratory: Negative for cough and shortness of breath.   Cardiovascular: Negative for chest pain.  Musculoskeletal: Positive for back pain.  Neurological: Negative for weakness.  Psychiatric/Behavioral: Negative for behavioral problems and confusion.    Vital Signs: BP (!) 145/78    Pulse 63    Temp 97.8 F (36.6 C) (Temporal)     Ht 5\' 11"  (1.803 m)    Wt 192 lb (87.1 kg)    SpO2 99%    BMI 26.78 kg/m   Physical Exam Vitals signs reviewed.  Cardiovascular:     Rate and Rhythm: Normal rate and regular rhythm.     Heart sounds: Murmur present.  Pulmonary:     Breath sounds: Normal breath sounds.  Abdominal:     Tenderness: There is no abdominal tenderness.  Musculoskeletal: Normal range of motion.  Skin:    General: Skin is warm and dry.  Neurological:     Mental Status: He is alert and oriented to person, place, and time.  Psychiatric:        Mood and Affect: Mood normal.        Behavior: Behavior normal.        Thought Content: Thought content normal.        Judgment: Judgment normal.     Imaging: Mr Pelvis W Wo Contrast  Result Date: 03/29/2019 CLINICAL DATA:  Follow-up bone lesions seen on recent CT scan. EXAM: MRI PELVIS WITHOUT AND WITH CONTRAST TECHNIQUE: Multiplanar multisequence MR imaging of the pelvis was performed both before and after administration of intravenous contrast. CONTRAST:  8 cc Gadavist COMPARISON:  CT scan 03/14/2019 FINDINGS: There is a large complex destructive bone lesion involving the right iliac bone extending down into the superior acetabulum. This measures 6.3 x 3.9 cm and demonstrates heterogeneous contrast enhancement. There is also a destructive bony lesion involving the ischium just above the ischial tuberosity the posterior cortex is eroded and there is associated soft tissue component extending out into the operator internus muscle and into they fat of the sciatic notch. This measures 3.9 x 3.6 cm. Other small left-sided sacral lesions are noted. There is a small lesion involving the medial cortex of the left femur near the lesser trochanter. This measures 9.5 mm. There is a lesion in the anterior aspect of the left acetabulum but this has the appearance of a benign intraosseous lipoma. No intrapelvic abnormalities are identified. No pelvic lymphadenopathy or pelvic mass.  Mild prostate gland enlargement with median lobe hypertrophy noted. Left inguinal hernia containing fat.  No inguinal adenopathy. IMPRESSION: 1. Destructive bone lesions as detailed above. Findings most consistent with metastatic disease. PET-CT may be helpful for further  evaluation and to establish a primary tumor. The right pelvic bone lesions should be amenable to image guided biopsy but a PET scan may demonstrate easier/safer biopsy sites. 2. No intrapelvic mass or adenopathy. 3. Benign intraosseous lipoma involving the left anterior superior acetabulum. Electronically Signed   By: Marijo Sanes M.D.   On: 03/29/2019 11:51   Nm Pet Image Initial (pi) Whole Body  Result Date: 04/06/2019 CLINICAL DATA:  Initial treatment strategy for pelvic bone lesions. EXAM: NUCLEAR MEDICINE PET WHOLE BODY TECHNIQUE: 9.62 mCi F-18 FDG was injected intravenously. Full-ring PET imaging was performed from the skull base to thigh after the radiotracer. CT data was obtained and used for attenuation correction and anatomic localization. Fasting blood glucose: 138 mg/dl COMPARISON:  MRI pelvis 03/28/2019 FINDINGS: Mediastinal blood pool activity: SUV max 2.46 HEAD/NECK: No hypermetabolic activity in the scalp. No hypermetabolic cervical lymph nodes. Incidental CT findings: none CHEST: No chest wall mass, supraclavicular or axillary adenopathy. No mediastinal or hilar mass or adenopathy. No worrisome hypermetabolic pulmonary lesions to suggest a primary lung neoplasm. A few tiny scattered pulmonary nodules are likely benign. Incidental CT findings: Moderate calcifications along the aortic valve. Small hiatal hernia. ABDOMEN/PELVIS: No abnormal hypermetabolic activity within the liver, pancreas, adrenal glands, or spleen. There is an 8 mm retroperitoneal lymph node on image number 177 just anterior to the IVC. This is mildly hypermetabolic with SUV max of 2.40. There is also a 7.5 mm node between the IVC and the right psoas muscle  which is mildly hypermetabolic with SUV max of 4.0. 8 mm obturator lymph node on the right side of the pelvis on image number 212 is mildly hypermetabolic with SUV max of 9.73. Incidental CT findings: none SKELETON: Diffuse osseous metastatic disease is demonstrated with multiple hypermetabolic bone lesions. There is a right-sided lesion involving the T2 vertebral body, a lesion in the right first rib, AP lesion in the left eleventh posterior rib with possible pathologic fracture and a L1 lesion. There are numerous pelvic bone lesions which are hypermetabolic. Left-sided iliac bone lesion has an SUV max of 5.99. The large destructive lesion involving the right ischium posteriorly has an SUV max of 11.94. This may be the safest and easiest lesion to biopsy. Incidental CT findings: none EXTREMITIES: No lower extremity lesions. Incidental CT findings: none IMPRESSION: 1. Diffuse osseous metastatic disease as detailed above without findings for a primary neoplasm in the chest, abdomen or pelvis. The large destructive lesion involving the right ischium should be amenable to image guided biopsy. 2. Two small retroperitoneal lymph nodes and 1 small right obturator node showing hypermetabolism. Electronically Signed   By: Marijo Sanes M.D.   On: 04/06/2019 17:08    Labs:  CBC: Recent Labs    10/03/18 1353 03/01/19 0934 03/07/19 0926 03/07/19 0931 04/06/19 1103 04/16/19 0955  WBC 5.5 6.1  --   --  6.9 7.6  HGB 13.5 12.3* 13.3 12.9* 15.0 14.3  HCT 39.9 35.3* 39.0 38.0* 47.4 43.9  PLT 214.0 175  --   --  208 203    COAGS: Recent Labs    07/21/18 1637 04/06/19 1103  INR 1.15 1.2    BMP: Recent Labs    07/25/18 0453 07/26/18 0358 08/01/18 1119 10/03/18 1353 03/01/19 0934 03/07/19 0926 03/07/19 0931 04/06/19 1103  NA 141 139 140 139 141 143 143 140  K 3.4* 3.4* 4.3 4.0 3.7 3.7 3.6 4.0  CL 110 110 104 101 102  --   --  103  CO2 26 25 29 31 27   --   --  26  GLUCOSE 92 89 102* 93 101*  --    --  129*  BUN 5* 9 13 15 17   --   --  18  CALCIUM 8.3* 8.6* 9.5 9.6 9.3  --   --  9.7  CREATININE 0.99 1.00 0.85 0.85 0.92  --   --  0.98  GFRNONAA >60 >60  --   --  79  --   --  >60  GFRAA >60 >60  --   --  91  --   --  >60    LIVER FUNCTION TESTS: No results for input(s): BILITOT, AST, ALT, ALKPHOS, PROT, ALBUMIN in the last 8760 hours.  TUMOR MARKERS: No results for input(s): AFPTM, CEA, CA199, CHROMGRNA in the last 8760 hours.  Assessment and Plan:  Was planned for TAVR 7/21 Cancelled secondary pre op work up revealing abnormal MR- bony lesions in pelvis Scheduled now for +PET Rt acetabular lesion biopsy Risks and benefits of right acetabular lesion bx was discussed with the patient and/or patient's family including, but not limited to bleeding, infection, damage to adjacent structures or low yield requiring additional tests.  All of the questions were answered and there is agreement to proceed.  Consent signed and in chart.   Thank you for this interesting consult.  I greatly enjoyed meeting [name] [name] and look forward to participating in their care.  A copy of this report was sent to the requesting provider on this date.  Electronically Signed: Lavonia Drafts, PA-C 04/16/2019, 10:33 AM   I spent a total of  30 Minutes   in face to face in clinical consultation, greater than 50% of which was counseling/coordinating care for right acetabular lesion bx

## 2019-04-16 NOTE — Procedures (Signed)
Interventional Radiology Procedure Note  Procedure: CT guided bx of right posterior acetabular lesion. Mx core bx's Complications: None Recommendations:  - Ok to shower tomorrow - Do not submerge for 7 days - Routine care   Signed,  Dulcy Fanny. Earleen Newport, DO

## 2019-04-20 ENCOUNTER — Other Ambulatory Visit: Payer: Self-pay | Admitting: Physician Assistant

## 2019-04-20 ENCOUNTER — Telehealth: Payer: Self-pay | Admitting: Physician Assistant

## 2019-04-20 NOTE — Telephone Encounter (Signed)
  [facility]   MULTIDISCIPLINARY HEART VALVE TEAM  Pathology report from bone biopsy showed plasma cell neoplasm consistent with multiple myeloma. I have called the cancer center to place a referral. Discussed case with Dr. Cyndia Bent who would like to have him evaluated by heme/onc prior to deciding on timeline of TAVR for severe aortic stenosis. I have discussed our findings and plan with the patient. He understands and was appreciative of his care.   Angelena Form PA-C  [facility]

## 2019-04-23 ENCOUNTER — Telehealth: Payer: Self-pay | Admitting: Hematology

## 2019-04-23 NOTE — Telephone Encounter (Signed)
Received a call from Angelena Form, PA to schedule an appt for lytic bone lesions. I cld and spoke to and Mr. Arca and scheduled im to see Dr. Irene Limbo on 8/17 at 11am. Pt is aware 20 minutes early.

## 2019-04-29 NOTE — Progress Notes (Signed)
HEMATOLOGY/ONCOLOGY CONSULTATION NOTE  Date of Service: 04/29/2019  Patient Care Team: Cassandria Anger, MD as PCP - General Erline Levine, MD as Attending Physician (Neurosurgery) Larey Dresser, MD (Cardiology) Jarome Matin, MD as Consulting Physician (Dermatology) Rigoberto Noel, MD as Consulting Physician (Pulmonary Disease)  CHIEF COMPLAINTS/PURPOSE OF CONSULTATION:  Newly diagnosed Plasma cell myeloma  HISTORY OF PRESENTING ILLNESS:   [name] [name] is a wonderful 78 y.o. male who has been referred to [place] by Angelena Form, PA for evaluation and management of lytic bone lesions. The pt reports that he is doing well overall.  The pt reports that he has occasional hip pain that radiates down his legs and prevents him from walking. He uses Advil, which helps his hip pain. He is used to exercising often, but has not been able to stay very physically active lately so he is gaining weight. The pt experiences some SOB when he wakes up in the morning.  The pt had a pre-procedural CTA C/A/P before a TAVR completed on 03/14/2019 which revealed "indeterminate osseus lesions in the bony pelvis," which led to an MRI and PET scan. He reports that he has pain when he pushes on his right chest.   He also notes that he had a fall while exercising in 06/2018 and thought he broke his back. He received a blood transfusion on 07/24/2018.   Of note prior to the patient's visit today, the pt has had a MRI pelvis w/wo contrast completed on 03/28/2019 with results revealing "1. Destructive bone lesions as detailed above. Findings most consistent with metastatic disease. PET-CT may be helpful for further evaluation and to establish a primary tumor. The right pelvic bone lesions should be amenable to image guided biopsy but a PET scan may demonstrate easier/safer biopsy sites. 2. No intrapelvic mass or adenopathy. 3. Benign intraosseous lipoma involving the left anterior superior acetabulum."  The pt  has also had PET whole body completed on 04/06/2019 with results revealing "1. Diffuse osseous metastatic disease as detailed above without findings for a primary neoplasm in the chest, abdomen or pelvis. The large destructive lesion involving the right ischium should be amenable to image guided biopsy. 2. Two small retroperitoneal lymph nodes and 1 small right obturator node showing hypermetabolism."   Most recent lab results (04/06/2019) of CBC is as follows: all values are WNL.  On review of systems, pt reports hip and leg pain, weight gain and denies syncope and denies belly pain, recent neuropathy and any other symptoms.   On PMHx the pt reports 5 cm hepatic flexure polyp removal, pneumonia, blood transfusion on 07/24/2018 On Social Hx the pt reports that he lives at home with his wife.  MEDICAL HISTORY:  Past Medical History:  Diagnosis Date   Acute blood loss anemia    Adenomatous colon polyp    Ascending aortic aneurysm (HCC)    Bicuspid aortic valve    Chest pain    ETT-myoview 12/11 w/exercise, no chest pain, no significant ST changes, EF 69%, no evidence for ischemia or infarction.   CHF NYHA class I (no symptoms from ordinary activities), acute, diastolic (HCC)    Fatty liver    mild   GI bleed    GI bleeding 07/21/2018   post polypectomy   Hemorrhoids    HTN (hypertension)    Hypercholesteremia    Hypokalemia    Internal hemorrhoids    LBP (low back pain)    Moderate aortic stenosis    Osteoarthritis  Paroxysmal atrial fibrillation ([place])    a. new onset Afib in 07/2008. He underwent ibutilide cardioversion successfully. b. Recurrence 01/2013 s/p TEE/DCCV - was on Xarelto but he stopped it as he was convinced it was causing joint pn. c. Recurrence 01/2016 - spont conv to NSR. Pt took Eliquis x1 mo then declined further anticoag. d. Recurrence 07/2016.   Pneumonia    Tubular adenoma of colon     SURGICAL HISTORY: Past Surgical History:  Procedure  Laterality Date   BACK SURGERY  x12 years ago   CARDIOVERSION N/A 01/26/2013   Procedure: CARDIOVERSION;  Surgeon: Larey Dresser, MD;  Location: [place];  Service: Cardiovascular;  Laterality: N/A;   CARDIOVERSION N/A 10/28/2017   Procedure: CARDIOVERSION;  Surgeon: Larey Dresser, MD;  Location: [facility] ENDOSCOPY;  Service: Cardiovascular;  Laterality: N/A;   CARDIOVERSION N/A 03/03/2018   Procedure: CARDIOVERSION;  Surgeon: Lelon Perla, MD;  Location: [facility] ENDOSCOPY;  Service: Cardiovascular;  Laterality: N/A;   COLONOSCOPY     COLONOSCOPY  07/17/2018   at [place]     RIGHT/LEFT HEART CATH AND CORONARY ANGIOGRAPHY N/A 03/07/2019   Procedure: RIGHT/LEFT HEART CATH AND CORONARY ANGIOGRAPHY;  Surgeon: Burnell Blanks, MD;  Location: [place] CV [facility];  Service: Cardiovascular;  Laterality: N/A;   TEE WITHOUT CARDIOVERSION N/A 01/26/2013   Procedure: TRANSESOPHAGEAL ECHOCARDIOGRAM (TEE);  Surgeon: Larey Dresser, MD;  Location: [place];  Service: Cardiovascular;  Laterality: N/A;   TEE WITHOUT CARDIOVERSION N/A 10/28/2017   Procedure: TRANSESOPHAGEAL ECHOCARDIOGRAM (TEE);  Surgeon: Larey Dresser, MD;  Location: [facility] ENDOSCOPY;  Service: Cardiovascular;  Laterality: N/A;    SOCIAL HISTORY: Social History   Socioeconomic History   Marital status: Married    Spouse name: Not on file   Number of children: 0   Years of education: Not on file   Highest education level: Not on file  Occupational History   Occupation: Retired Lobbyist: [employer] resource strain: Not on file   Food insecurity    Worry: Not on file    Inability: Not on file   Transportation needs    Medical: Not on file    Non-medical: Not on file  Tobacco Use   Smoking status: Never Smoker   Smokeless tobacco: Never Used  Substance and Sexual Activity   Alcohol use: Yes      Comment: Drinks 1 glass of wine nightly/socially   Drug use: No   Sexual activity: Yes  Lifestyle   Physical activity    Days per week: Not on file    Minutes per session: Not on file   Stress: Not on file  Relationships   Social connections    Talks on phone: Not on file    Gets together: Not on file    Attends religious service: Not on file    Active member of club or organization: Not on file    Attends meetings of clubs or organizations: Not on file    Relationship status: Not on file   Intimate partner violence    Fear of current or ex partner: Not on file    Emotionally abused: Not on file    Physically abused: Not on file    Forced sexual activity: Not on file  Other Topics Concern   Not on file  Social History Narrative  Patient lives in [place] w/ his wife. He is a native of [place]. 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   Right-handed   Caffeine: 2 cups coffee per day    FAMILY HISTORY: Family History  Problem Relation Age of Onset   Colon cancer Mother 4   Hypertension Other    Coronary artery disease Neg Hx    Colon polyps Neg Hx    Esophageal cancer Neg Hx    Rectal cancer Neg Hx    Stomach cancer Neg Hx     ALLERGIES:  is allergic to xarelto [rivaroxaban]; corticosteroids; ramipril; and benazepril.  MEDICATIONS:  Current Outpatient Medications  Medication Sig Dispense Refill   apixaban (ELIQUIS) 5 MG TABS tablet Take 1 tablet (5 mg total) by mouth 2 (two) times daily. 60 tablet 6   famotidine (PEPCID) 40 MG tablet Take 1 tablet (40 mg total) by mouth daily. (Patient taking differently: Take 40 mg by mouth at bedtime. ) 30 tablet 11   hydrochlorothiazide (MICROZIDE) 12.5 MG capsule Take 12.5 mg by mouth every evening.      HYDROcodone-acetaminophen (NORCO/VICODIN) 5-325 MG tablet Take 1 tablet by mouth every 6 (six) hours as needed for severe pain. 60 tablet 0   ibuprofen (ADVIL) 200 MG  tablet Take 400 mg by mouth every 8 (eight) hours as needed for moderate pain.      loratadine (CLARITIN) 10 MG tablet Take 1 tablet (10 mg total) by mouth daily. 30 tablet 11   metoprolol succinate (TOPROL-XL) 50 MG 24 hr tablet TAKE 1 TABLET (50 MG TOTAL) BY MOUTH 2 (TWO) TIMES DAILY. TAKE WITH OR IMMEDIATELY FOLLOWING A MEAL. 180 tablet 3   predniSONE (DELTASONE) 5 MG tablet Take 3 tablets (15 mg total) by mouth daily with breakfast. 90 tablet 3   Vitamin D, Cholecalciferol, 50 MCG (2000 UT) CAPS Take 2,000 Units by mouth daily.     No current facility-administered medications for this visit.     REVIEW OF SYSTEMS:    A 10+ POINT REVIEW OF SYSTEMS WAS OBTAINED including neurology, dermatology, psychiatry, cardiac, respiratory, lymph, extremities, GI, GU, Musculoskeletal, constitutional, breasts, reproductive, HEENT.  All pertinent positives are noted in the HPI.  All others are negative.   PHYSICAL EXAMINATION: ECOG PERFORMANCE STATUS: 2 - Symptomatic, <50% confined to bed  . Vitals:   04/30/19 1119  BP: (!) 146/75  Pulse: 70  Resp: 18  Temp: 98.2 F (36.8 C)  SpO2: 99%   Filed Weights   04/30/19 1119  Weight: 203 lb 3.2 oz (92.2 kg)   .Body mass index is 28.34 kg/m.   GENERAL:alert, in no acute distress and comfortable SKIN: no acute rashes, no significant lesions EYES: conjunctiva are pink and non-injected, sclera anicteric OROPHARYNX: MMM, no exudates, no oropharyngeal erythema or ulceration NECK: supple, no JVD LYMPH:  no palpable lymphadenopathy in the cervical, axillary or inguinal regions LUNGS: clear to auscultation b/l with normal respiratory effort HEART: 3/6 murmur ABDOMEN:  normoactive bowel sounds , non tender, not distended. Extremity: 2+ pedal edema PSYCH: alert & oriented x 3 with fluent speech NEURO: no focal motor/sensory deficits  LABORATORY DATA:  I have reviewed the data as listed  . CBC Latest Ref Rng & Units 05/04/2019 04/30/2019 04/16/2019    WBC 4.0 - 10.5 K/uL 7.3 7.1 7.6  Hemoglobin 13.0 - 17.0 g/dL 13.7 14.4 14.3  Hematocrit 39.0 - 52.0 % 42.5 44.4 43.9  Platelets 150 - 400 K/uL 188 202  203    . CMP Latest Ref Rng & Units 05/04/2019 04/30/2019 04/06/2019  Glucose 70 - 99 mg/dL 115(H) 108(H) 129(H)  BUN 8 - 23 mg/dL _0 Creatinine 0.61 - 1.24 mg/dL 0.76 0.96 0.98  Sodium 135 - 145 mmol/L 139 141 140  Potassium 3.5 - 5.1 mmol/L 4.0 3.5 4.0  Chloride 98 - 111 mmol/L 105 102 103  CO2 22 - 32 mmol/L _1 Calcium 8.9 - 10.3 mg/dL 9.3 9.6 9.7  Total Protein 6.5 - 8.1 g/dL 6.8 7.9 -  Total Bilirubin 0.3 - 1.2 mg/dL 0.8 0.4 -  Alkaline Phos 38 - 126 U/L 103 107 -  AST 15 - 41 U/L 23 24 -  ALT 0 - 44 U/L <5 30 -   04/16/2019 Surgical Pathology:   RADIOGRAPHIC STUDIES: I have personally reviewed the radiological images as listed and agreed with the findings in the report. Nm Pet Image Initial (pi) Whole Body  Result Date: 04/06/2019 CLINICAL DATA:  Initial treatment strategy for pelvic bone lesions. EXAM: NUCLEAR MEDICINE PET WHOLE BODY TECHNIQUE: 9.62 mCi F-18 FDG was injected intravenously. Full-ring PET imaging was performed from the skull base to thigh after the radiotracer. CT data was obtained and used for attenuation correction and anatomic localization. Fasting blood glucose: 138 mg/dl COMPARISON:  MRI pelvis 03/28/2019 FINDINGS: Mediastinal blood pool activity: SUV max 2.46 HEAD/NECK: No hypermetabolic activity in the scalp. No hypermetabolic cervical lymph nodes. Incidental CT findings: none CHEST: No chest wall mass, supraclavicular or axillary adenopathy. No mediastinal or hilar mass or adenopathy. No worrisome hypermetabolic pulmonary lesions to suggest a primary lung neoplasm. A few tiny scattered pulmonary nodules are likely benign. Incidental CT findings: Moderate calcifications along the aortic valve. Small hiatal hernia. ABDOMEN/PELVIS: No abnormal hypermetabolic activity within the liver, pancreas,  adrenal glands, or spleen. There is an 8 mm retroperitoneal lymph node on image number 177 just anterior to the IVC. This is mildly hypermetabolic with SUV max of 7.34. There is also a 7.5 mm node between the IVC and the right psoas muscle which is mildly hypermetabolic with SUV max of 4.0. 8 mm obturator lymph node on the right side of the pelvis on image number 212 is mildly hypermetabolic with SUV max of 2.87. Incidental CT findings: none SKELETON: Diffuse osseous metastatic disease is demonstrated with multiple hypermetabolic bone lesions. There is a right-sided lesion involving the T2 vertebral body, a lesion in the right first rib, AP lesion in the left eleventh posterior rib with possible pathologic fracture and a L1 lesion. There are numerous pelvic bone lesions which are hypermetabolic. Left-sided iliac bone lesion has an SUV max of 5.99. The large destructive lesion involving the right ischium posteriorly has an SUV max of 11.94. This may be the safest and easiest lesion to biopsy. Incidental CT findings: none EXTREMITIES: No lower extremity lesions. Incidental CT findings: none IMPRESSION: 1. Diffuse osseous metastatic disease as detailed above without findings for a primary neoplasm in the chest, abdomen or pelvis. The large destructive lesion involving the right ischium should be amenable to image guided biopsy. 2. Two small retroperitoneal lymph nodes and 1 small right obturator node showing hypermetabolism. Electronically Signed   By: Marijo Sanes M.D.   On: 04/06/2019 17:08   Ct Biopsy  Result Date: 04/16/2019 INDICATION: 78 year old male with a history of FDG positive bone lesions EXAM: CT BIOPSY MEDICATIONS: None. ANESTHESIA/SEDATION: Moderate (conscious) sedation was employed during this procedure. A total of Versed 1.5 mg  and Fentanyl 75 mcg was administered intravenously. Moderate Sedation Time: 10 minutes. The patient's level of consciousness and vital signs were monitored continuously by  radiology nursing throughout the procedure under my direct supervision. FLUOROSCOPY TIME:  CT COMPLICATIONS: None PROCEDURE: Informed written consent was obtained from the patient after a thorough discussion of the procedural risks, benefits and alternatives. All questions were addressed. Maximal Sterile Barrier Technique was utilized including caps, mask, sterile gowns, sterile gloves, sterile drape, hand hygiene and skin antiseptic. A timeout was performed prior to the initiation of the procedure. Patient position prone position on CT gantry table. Scout CT was acquired for planning purposes. Patient is prepped and draped in the usual sterile fashion. 1% lidocaine was used for local anesthesia. Using CT guidance guide needle was placed into the soft tissue component of posterior acetabular lesion. Multiple 18 gauge core biopsy were acquired. Patient tolerated the procedure well and remained hemodynamically stable throughout. No complications were encountered and no significant blood loss. IMPRESSION: Status post CT-guided biopsy of soft tissue lesion in the posterior acetabulum. Signed, Dulcy Fanny. Dellia Nims, RPVI [facility] Radiology Electronically Signed   By: Corrie Mckusick D.O.   On: 04/16/2019 13:06    ASSESSMENT & PLAN:   78 yo   #1 Newly diagnosed Plasma cell myeloma  03/28/2019 MRI pelvis w/wo contrast revealed "1. Destructive bone lesions as detailed above. Findings most consistent with metastatic disease. PET-CT may be helpful for further evaluation and to establish a primary tumor. The right pelvic bone lesions should be amenable to image guided biopsy but a PET scan may demonstrate easier/safer biopsy sites. 2. No intrapelvic mass or adenopathy. 3. Benign intraosseous lipoma involving the left anterior superior acetabulum."  04/06/2019 PET whole body revealed "1. Diffuse osseous metastatic disease as detailed above without findings for a primary  neoplasm in the chest, abdomen or pelvis. The large destructive lesion involving the right ischium should be amenable to image guided biopsy. 2. Two small retroperitoneal lymph nodes and 1 small right obturator node showing hypermetabolism."   #2 Severe aortic stenosis with bicuspid aortic valve -10/26/2018 ECHO revealed AVA at 0.8 cm2 and LV EF of 60-65% -Pt was scheduled for a TAVR, but this was cancelled after the discovery of his bone lesions.  PLAN: -Discussed the patient's most recent labs from 04/06/2019; all values are WNL -Discussed the 04/06/2019 PET whole body which revealed lesions in the ribs, spine, and pelvis. Reviewed images with the pt. -Discussed the 04/16/2019 surgical pathology which revealed plasma cell myeloma -Discussed the CRAB criteria with the normal calcium levels and renal function, no anemia, PET scan revealed bone lesions in the ribs, spine, and pelvis -Discussed the 10/26/2018 ECHO which revealed AVA at 0.8 cm2, and LV EF of 60-65% -Discussed that plasma cell myeloma is not curable, but can be treated and managed to maintain QOL. -Discussed the need to balance heart treatment with myeloma treatment. Explained that the pt may have to take a break from myeloma treatment if his heart issues worsen and he needs surgery OR based on today's labs, may recommend fixing valve before beginning myeloma treatment. Pt voiced understanding and agrees with this plan. -subsequently discussed with his case with his cardiothoracic surgery team-- it would be prudent to proceed with TAVR prior to initiating myeloma treatment to avoid limiting volume overload and other treatment related issues. -Recommend avoiding Advil -Will schedule BM Bx -Blood work today and 24 hr UPEP -Will see the pt back in 2  weeks   Labs today CT bone marrow biopsy in 5 days RTC with Dr Irene Limbo in 2 weeks   No orders of the defined types were placed in this encounter.  All of the patients questions were  answered with apparent satisfaction. The patient knows to call the clinic with any problems, questions or concerns.  I spent 60 minutes counseling the patient face to face. The total time spent in the appointment was 80 minutes and more than 50% was on counseling and direct patient cares.    Sullivan Lone MD MS AAHIVMS [facility] Hematology/Oncology Physician [facility]  (Office):       [phone] (Work cell):  [phone] (Fax):           [phone]  04/29/2019 3:36 PM  I, De Burrs, am acting as a scribe for Dr. Irene Limbo  .I have reviewed the above documentation for accuracy and completeness, and I agree with the above. Brunetta Genera MD

## 2019-04-30 ENCOUNTER — Telehealth: Payer: Self-pay | Admitting: Hematology

## 2019-04-30 ENCOUNTER — Inpatient Hospital Stay: Payer: Medicare Other | Attending: Hematology | Admitting: Hematology

## 2019-04-30 ENCOUNTER — Inpatient Hospital Stay: Payer: Medicare Other

## 2019-04-30 ENCOUNTER — Other Ambulatory Visit: Payer: Self-pay

## 2019-04-30 VITALS — BP 146/75 | HR 70 | Temp 98.2°F | Resp 18 | Ht 71.0 in | Wt 203.2 lb

## 2019-04-30 DIAGNOSIS — E7800 Pure hypercholesterolemia, unspecified: Secondary | ICD-10-CM

## 2019-04-30 DIAGNOSIS — Z7901 Long term (current) use of anticoagulants: Secondary | ICD-10-CM

## 2019-04-30 DIAGNOSIS — M25559 Pain in unspecified hip: Secondary | ICD-10-CM

## 2019-04-30 DIAGNOSIS — K760 Fatty (change of) liver, not elsewhere classified: Secondary | ICD-10-CM | POA: Diagnosis not present

## 2019-04-30 DIAGNOSIS — Z79899 Other long term (current) drug therapy: Secondary | ICD-10-CM

## 2019-04-30 DIAGNOSIS — I10 Essential (primary) hypertension: Secondary | ICD-10-CM

## 2019-04-30 DIAGNOSIS — C9000 Multiple myeloma not having achieved remission: Secondary | ICD-10-CM | POA: Diagnosis not present

## 2019-04-30 DIAGNOSIS — Z7189 Other specified counseling: Secondary | ICD-10-CM

## 2019-04-30 DIAGNOSIS — I480 Paroxysmal atrial fibrillation: Secondary | ICD-10-CM

## 2019-04-30 LAB — CBC WITH DIFFERENTIAL/PLATELET
Abs Immature Granulocytes: 0.08 10*3/uL — ABNORMAL HIGH (ref 0.00–0.07)
Basophils Absolute: 0 10*3/uL (ref 0.0–0.1)
Basophils Relative: 0 %
Eosinophils Absolute: 0 10*3/uL (ref 0.0–0.5)
Eosinophils Relative: 0 %
HCT: 44.4 % (ref 39.0–52.0)
Hemoglobin: 14.4 g/dL (ref 13.0–17.0)
Immature Granulocytes: 1 %
Lymphocytes Relative: 9 %
Lymphs Abs: 0.6 10*3/uL — ABNORMAL LOW (ref 0.7–4.0)
MCH: 30.7 pg (ref 26.0–34.0)
MCHC: 32.4 g/dL (ref 30.0–36.0)
MCV: 94.7 fL (ref 80.0–100.0)
Monocytes Absolute: 0.4 10*3/uL (ref 0.1–1.0)
Monocytes Relative: 6 %
Neutro Abs: 5.9 10*3/uL (ref 1.7–7.7)
Neutrophils Relative %: 84 %
Platelets: 202 10*3/uL (ref 150–400)
RBC: 4.69 MIL/uL (ref 4.22–5.81)
RDW: 13.3 % (ref 11.5–15.5)
WBC: 7.1 10*3/uL (ref 4.0–10.5)
nRBC: 0 % (ref 0.0–0.2)

## 2019-04-30 LAB — CMP (CANCER CENTER ONLY)
ALT: 30 U/L (ref 0–44)
AST: 24 U/L (ref 15–41)
Albumin: 4.2 g/dL (ref 3.5–5.0)
Alkaline Phosphatase: 107 U/L (ref 38–126)
Anion gap: 9 (ref 5–15)
BUN: 17 mg/dL (ref 8–23)
CO2: 30 mmol/L (ref 22–32)
Calcium: 9.6 mg/dL (ref 8.9–10.3)
Chloride: 102 mmol/L (ref 98–111)
Creatinine: 0.96 mg/dL (ref 0.61–1.24)
GFR, Est AFR Am: >60 mL/min (ref >60)
GFR, Estimated: >60 mL/min (ref >60)
Glucose, Bld: 108 mg/dL — ABNORMAL HIGH (ref 70–99)
Potassium: 3.5 mmol/L (ref 3.5–5.1)
Sodium: 141 mmol/L (ref 135–145)
Total Bilirubin: 0.4 mg/dL (ref 0.3–1.2)
Total Protein: 7.9 g/dL (ref 6.5–8.1)

## 2019-04-30 LAB — SEDIMENTATION RATE: Sed Rate: 3 mm/hr (ref 0–16)

## 2019-04-30 LAB — LACTATE DEHYDROGENASE: LDH: 132 U/L (ref 98–192)

## 2019-04-30 NOTE — Telephone Encounter (Signed)
Scheduled per 08/17 los and checked patient in for lad appointment.

## 2019-04-30 NOTE — Patient Instructions (Signed)
Thank you for choosing [facility] to provide your oncology and hematology care.   Should you have questions after your visit to the [facility] ([facility]), please contact this office at [phone] between 8:30 AM and 4:30 PM. Voicemails left after 4:00 PM may not be returned until the following business day. Calls received after 4:30 PM will be answered by an off-site Nurse Triage Line.    Prescription Refills:  Please have your pharmacy contact us directly for most prescription requests.  Contact the office directly for refills of narcotics (pain medications). Allow 48-72 hours for refills.  Appointments: Please contact the [facility] scheduling department [phone] for questions regarding [facility] appointment scheduling.  Contact the schedulers with any scheduling changes so that your appointment can be rescheduled in a timely manner.   Central Scheduling for [place] [phone] - Call to schedule procedures such as PET scans, CT scans, MRI, Ultrasound, etc.  To afford each patient quality time with our providers, please arrive 30 minutes before your scheduled appointment time.  If you arrive late for your appointment, you may be asked to reschedule.  We strive to give you quality time with our providers, and arriving late affects you and other patients whose appointments are after yours. If you are a no show for multiple scheduled visits, you may be dismissed from the clinic at the providers discretion.     Resources: [facility] Social Workers [phone] for additional information on assistance programs --Anne Cunningham/Abigail Elmore  [place] DSS  [phone]: Information regarding food stamps, Medicaid, and utility assistance [place] [phone]   [place] [employer]'s shared-ride transportation service for eligible riders who have a disability that prevents them from riding the fixed route bus.   Medicare Rights Center 800-333-4114 Helps people with  Medicare understand their rights and benefits, navigate the Medicare system, and secure the quality healthcare they deserve [employer] 800-227-2345 Assists patients locate various types of support and financial assistance Cancer Care: 1-800-813-HOPE (4673) Provides financial assistance, online support groups, medication/co-pay assistance.      

## 2019-05-01 ENCOUNTER — Other Ambulatory Visit: Payer: Self-pay

## 2019-05-01 ENCOUNTER — Telehealth: Payer: Self-pay | Admitting: Physician Assistant

## 2019-05-01 DIAGNOSIS — I350 Nonrheumatic aortic (valve) stenosis: Secondary | ICD-10-CM

## 2019-05-01 LAB — KAPPA/LAMBDA LIGHT CHAINS
Kappa free light chain: 821.2 mg/L — ABNORMAL HIGH (ref 3.3–19.4)
Kappa, lambda light chain ratio: 432.21 — ABNORMAL HIGH (ref 0.26–1.65)
Lambda free light chains: 1.9 mg/L — ABNORMAL LOW (ref 5.7–26.3)

## 2019-05-01 NOTE — Telephone Encounter (Signed)
Thanks Katie.

## 2019-05-01 NOTE — Telephone Encounter (Signed)
  [place]  Mr. Wandel was recently seen by Dr. Irene Limbo in the [facility] for initial evaluation of his recently diagnosed multiple myeloma. Dr. Irene Limbo feels that he has a reasonable prognosis based on PET scan and preliminary lab work, although other oncologic blood work currently pending. After discussions with the multidisciplinary valve team and Dr. Irene Limbo, we have decided that the best course of treatment would be to proceed with his TAVR surgery next week followed by treatment of his multiple myeloma. I have discussed our plan with Mr. Mantione who is in full agreement. We will book him for TAVR next Tuesday.   Angelena Form PA-C  [facility]

## 2019-05-02 ENCOUNTER — Other Ambulatory Visit: Payer: Self-pay

## 2019-05-02 DIAGNOSIS — M25559 Pain in unspecified hip: Secondary | ICD-10-CM | POA: Diagnosis not present

## 2019-05-02 DIAGNOSIS — E7800 Pure hypercholesterolemia, unspecified: Secondary | ICD-10-CM | POA: Diagnosis not present

## 2019-05-02 DIAGNOSIS — I480 Paroxysmal atrial fibrillation: Secondary | ICD-10-CM | POA: Diagnosis not present

## 2019-05-02 DIAGNOSIS — I10 Essential (primary) hypertension: Secondary | ICD-10-CM | POA: Diagnosis not present

## 2019-05-02 DIAGNOSIS — K760 Fatty (change of) liver, not elsewhere classified: Secondary | ICD-10-CM | POA: Diagnosis not present

## 2019-05-02 DIAGNOSIS — C9000 Multiple myeloma not having achieved remission: Secondary | ICD-10-CM | POA: Diagnosis not present

## 2019-05-02 LAB — MULTIPLE MYELOMA PANEL, SERUM
Albumin SerPl Elph-Mcnc: 3.8 g/dL (ref 2.9–4.4)
Albumin/Glob SerPl: 1.1 (ref 0.7–1.7)
Alpha 1: 0.3 g/dL (ref 0.0–0.4)
Alpha2 Glob SerPl Elph-Mcnc: 0.7 g/dL (ref 0.4–1.0)
B-Globulin SerPl Elph-Mcnc: 0.9 g/dL (ref 0.7–1.3)
Gamma Glob SerPl Elph-Mcnc: 1.6 g/dL (ref 0.4–1.8)
Globulin, Total: 3.5 g/dL (ref 2.2–3.9)
IgA: 16 mg/dL — ABNORMAL LOW (ref 61–437)
IgG (Immunoglobin G), Serum: 1740 mg/dL — ABNORMAL HIGH (ref 603–1613)
IgM (Immunoglobulin M), Srm: 9 mg/dL — ABNORMAL LOW (ref 15–143)
M Protein SerPl Elph-Mcnc: 1.5 g/dL — ABNORMAL HIGH
Total Protein ELP: 7.3 g/dL (ref 6.0–8.5)

## 2019-05-02 LAB — VON WILLEBRAND PANEL
Coagulation Factor VIII: 120 % (ref 56–140)
Ristocetin Co-factor, Plasma: 146 % (ref 50–200)
Von Willebrand Antigen, Plasma: 165 % (ref 50–200)

## 2019-05-02 LAB — COAG STUDIES INTERP REPORT

## 2019-05-03 NOTE — Progress Notes (Signed)
[facility] [place], [place] - [address] [place] [address] [place] [address] Phone: [phone] Fax: [phone]  [id number] - [place], [place] [address] [place] [address] Phone: [phone] Fax: [phone]    Your procedure is scheduled on Tuesday, August 25th  Report to [place] Main Entrance "A" at 7:15 A.M., and check in at the Admitting office.  Call this number if you have problems the morning of surgery:  [phone]  Call [phone] if you have any questions prior to your surgery date Monday-Friday 8am-4pm    Remember:  Do not eat or drink after midnight the night before your surgery   Take these medicines the morning of surgery with A SIP OF WATER: NONE  As of today, STOP taking any Aspirin (unless otherwise instructed by your surgeon), Aleve, Naproxen, Ibuprofen, Motrin, Advil, Goody's, BC's, all herbal medications, fish oil, and all vitamins.  The Morning of Surgery  Do not wear jewelry, make-up or nail polish.  Do not wear lotions, powders, or perfumes/colognes, or deodorant  Do not shave 48 hours prior to surgery.  Men may shave face and neck.  Do not bring valuables to the hospital.  [facility] is not responsible for any belongings or valuables.  If you are a smoker, DO NOT Smoke 24 hours prior to surgery IF you wear a CPAP at night please bring your mask, tubing, and machine the morning of surgery   Remember that you must have someone to transport you home after your surgery, and remain with you for 24 hours if you are discharged the same day.  Contacts, glasses, hearing aids, dentures or bridgework may not be worn into surgery.   Leave your suitcase in the car.  After surgery it may be brought to your room.  For patients admitted to the hospital, discharge time will be determined by your treatment team.  Patients discharged the day of  surgery will not be allowed to drive home.   Special instructions:   [facility]- Preparing For Surgery  Before surgery, you can play an important role. Because skin is not sterile, your skin needs to be as free of germs as possible. You can reduce the number of germs on your skin by washing with CHG (chlorahexidine gluconate) Soap before surgery.  CHG is an antiseptic cleaner which kills germs and bonds with the skin to continue killing germs even after washing.    Oral Hygiene is also important to reduce your risk of infection.  Remember - BRUSH YOUR TEETH THE MORNING OF SURGERY WITH YOUR REGULAR TOOTHPASTE  Please do not use if you have an allergy to CHG or antibacterial soaps. If your skin becomes reddened/irritated stop using the CHG.  Do not shave (including legs and underarms) for at least 48 hours prior to first CHG shower. It is OK to shave your face.  Please follow these instructions carefully.   1. Shower the NIGHT BEFORE SURGERY and the MORNING OF SURGERY with CHG Soap.   2. If you chose to wash your hair, wash your hair first as usual with your normal shampoo.  3. After you shampoo, rinse your hair and body thoroughly to remove the shampoo.  4. Use CHG as you would any other liquid soap. You can apply CHG directly to the skin and wash gently with a scrungie or a clean washcloth.   5. Apply the CHG  Soap to your body ONLY FROM THE NECK DOWN.  Do not use on open wounds or open sores. Avoid contact with your eyes, ears, mouth and genitals (private parts). Wash Face and genitals (private parts)  with your normal soap.   6. Wash thoroughly, paying special attention to the area where your surgery will be performed.  7. Thoroughly rinse your body with warm water from the neck down.  8. DO NOT shower/wash with your normal soap after using and rinsing off the CHG Soap.  9. Pat yourself dry with a CLEAN TOWEL.  10. Wear CLEAN PAJAMAS to bed the night before surgery, wear comfortable  clothes the morning of surgery  11. Place CLEAN SHEETS on your bed the night of your first shower and DO NOT SLEEP WITH PETS.  Day of Surgery:  Do not apply any deodorants/lotions. Please shower the morning of surgery with the CHG soap  Please wear clean clothes to the hospital/surgery center.   Remember to brush your teeth WITH YOUR REGULAR TOOTHPASTE.  Please read over the following fact sheets that you were given.

## 2019-05-04 ENCOUNTER — Encounter (HOSPITAL_COMMUNITY)
Admission: RE | Admit: 2019-05-04 | Discharge: 2019-05-04 | Disposition: A | Discharge status: Home / Self Care | Payer: Medicare Other | Source: Ambulatory Visit | Attending: Cardiovascular Disease | Admitting: Cardiovascular Disease

## 2019-05-04 ENCOUNTER — Encounter (HOSPITAL_COMMUNITY): Payer: Self-pay

## 2019-05-04 ENCOUNTER — Other Ambulatory Visit: Payer: Self-pay

## 2019-05-04 ENCOUNTER — Other Ambulatory Visit (HOSPITAL_COMMUNITY)
Admission: RE | Admit: 2019-05-04 | Discharge: 2019-05-04 | Disposition: A | Discharge status: Home / Self Care | Payer: Medicare Other | Source: Ambulatory Visit | Attending: Cardiovascular Disease | Admitting: Cardiovascular Disease

## 2019-05-04 ENCOUNTER — Ambulatory Visit (HOSPITAL_COMMUNITY)
Admission: RE | Admit: 2019-05-04 | Discharge: 2019-05-04 | Disposition: A | Discharge status: Home / Self Care | Payer: Medicare Other | Source: Ambulatory Visit | Attending: Cardiovascular Disease | Admitting: Cardiovascular Disease

## 2019-05-04 DIAGNOSIS — J90 Pleural effusion, not elsewhere classified: Secondary | ICD-10-CM | POA: Diagnosis not present

## 2019-05-04 DIAGNOSIS — I480 Paroxysmal atrial fibrillation: Secondary | ICD-10-CM | POA: Diagnosis not present

## 2019-05-04 DIAGNOSIS — I712 Thoracic aortic aneurysm, without rupture: Secondary | ICD-10-CM | POA: Diagnosis not present

## 2019-05-04 DIAGNOSIS — C9000 Multiple myeloma not having achieved remission: Secondary | ICD-10-CM | POA: Diagnosis not present

## 2019-05-04 DIAGNOSIS — R918 Other nonspecific abnormal finding of lung field: Secondary | ICD-10-CM | POA: Diagnosis not present

## 2019-05-04 DIAGNOSIS — Z006 Encounter for examination for normal comparison and control in clinical research program: Secondary | ICD-10-CM | POA: Diagnosis not present

## 2019-05-04 DIAGNOSIS — I5033 Acute on chronic diastolic (congestive) heart failure: Secondary | ICD-10-CM | POA: Diagnosis not present

## 2019-05-04 DIAGNOSIS — Z20828 Contact with and (suspected) exposure to other viral communicable diseases: Secondary | ICD-10-CM | POA: Insufficient documentation

## 2019-05-04 DIAGNOSIS — I350 Nonrheumatic aortic (valve) stenosis: Secondary | ICD-10-CM

## 2019-05-04 DIAGNOSIS — E785 Hyperlipidemia, unspecified: Secondary | ICD-10-CM | POA: Diagnosis not present

## 2019-05-04 DIAGNOSIS — I2510 Atherosclerotic heart disease of native coronary artery without angina pectoris: Secondary | ICD-10-CM | POA: Diagnosis not present

## 2019-05-04 DIAGNOSIS — Q231 Congenital insufficiency of aortic valve: Secondary | ICD-10-CM | POA: Diagnosis not present

## 2019-05-04 DIAGNOSIS — E876 Hypokalemia: Secondary | ICD-10-CM | POA: Diagnosis not present

## 2019-05-04 DIAGNOSIS — I110 Hypertensive heart disease with heart failure: Secondary | ICD-10-CM | POA: Diagnosis not present

## 2019-05-04 DIAGNOSIS — Z01818 Encounter for other preprocedural examination: Secondary | ICD-10-CM | POA: Insufficient documentation

## 2019-05-04 DIAGNOSIS — R5082 Postprocedural fever: Secondary | ICD-10-CM | POA: Diagnosis not present

## 2019-05-04 LAB — BLOOD GAS, ARTERIAL
Acid-Base Excess: 4.6 mmol/L — ABNORMAL HIGH (ref 0.0–2.0)
Bicarbonate: 28.3 mmol/L — ABNORMAL HIGH (ref 20.0–28.0)
Drawn by: 42180
O2 Saturation: 97.4 %
Patient temperature: 98.6
pCO2 arterial: 40.2 mmHg (ref 32.0–48.0)
pH, Arterial: 7.462 — ABNORMAL HIGH (ref 7.350–7.450)
pO2, Arterial: 93 mmHg (ref 83.0–108.0)

## 2019-05-04 LAB — URINALYSIS, ROUTINE W REFLEX MICROSCOPIC
Bilirubin Urine: NEGATIVE
Glucose, UA: NEGATIVE mg/dL
Hgb urine dipstick: NEGATIVE
Ketones, ur: NEGATIVE mg/dL
Leukocytes,Ua: NEGATIVE
Nitrite: NEGATIVE
Protein, ur: NEGATIVE mg/dL
Specific Gravity, Urine: 1.006 (ref 1.005–1.030)
pH: 7 (ref 5.0–8.0)

## 2019-05-04 LAB — CBC
HCT: 42.5 % (ref 39.0–52.0)
Hemoglobin: 13.7 g/dL (ref 13.0–17.0)
MCH: 31 pg (ref 26.0–34.0)
MCHC: 32.2 g/dL (ref 30.0–36.0)
MCV: 96.2 fL (ref 80.0–100.0)
Platelets: 188 10*3/uL (ref 150–400)
RBC: 4.42 MIL/uL (ref 4.22–5.81)
RDW: 13.1 % (ref 11.5–15.5)
WBC: 7.3 10*3/uL (ref 4.0–10.5)
nRBC: 0 % (ref 0.0–0.2)

## 2019-05-04 LAB — PROTIME-INR
INR: 1 (ref 0.8–1.2)
Prothrombin Time: 13.4 seconds (ref 11.4–15.2)

## 2019-05-04 LAB — COMPREHENSIVE METABOLIC PANEL
ALT: <5 U/L (ref 0–44)
AST: 23 U/L (ref 15–41)
Albumin: 3.8 g/dL (ref 3.5–5.0)
Alkaline Phosphatase: 103 U/L (ref 38–126)
Anion gap: 11 (ref 5–15)
BUN: 14 mg/dL (ref 8–23)
CO2: 23 mmol/L (ref 22–32)
Calcium: 9.3 mg/dL (ref 8.9–10.3)
Chloride: 105 mmol/L (ref 98–111)
Creatinine, Ser: 0.76 mg/dL (ref 0.61–1.24)
GFR calc Af Amer: >60 mL/min (ref >60)
GFR calc non Af Amer: >60 mL/min (ref >60)
Glucose, Bld: 115 mg/dL — ABNORMAL HIGH (ref 70–99)
Potassium: 4 mmol/L (ref 3.5–5.1)
Sodium: 139 mmol/L (ref 135–145)
Total Bilirubin: 0.8 mg/dL (ref 0.3–1.2)
Total Protein: 6.8 g/dL (ref 6.5–8.1)

## 2019-05-04 LAB — HEMOGLOBIN A1C
Hgb A1c MFr Bld: 5.8 % — ABNORMAL HIGH (ref 4.8–5.6)
Mean Plasma Glucose: 119.76 mg/dL

## 2019-05-04 LAB — TYPE AND SCREEN
ABO/RH(D): AB NEG
Antibody Screen: NEGATIVE

## 2019-05-04 LAB — BRAIN NATRIURETIC PEPTIDE: B Natriuretic Peptide: 1279.6 pg/mL — ABNORMAL HIGH (ref 0.0–100.0)

## 2019-05-04 LAB — SARS CORONAVIRUS 2 (TAT 6-24 HRS): SARS Coronavirus 2: NEGATIVE

## 2019-05-04 LAB — SURGICAL PCR SCREEN
MRSA, PCR: NEGATIVE
Staphylococcus aureus: NEGATIVE

## 2019-05-04 LAB — APTT: aPTT: 29 seconds (ref 24–36)

## 2019-05-04 NOTE — Progress Notes (Signed)
PCP - Dr. Alain Marion Cardiologist - Dr. Aundra Dubin  Chest x-ray - 05/04/19 EKG - 05/04/19 Stress Test -  ECHO - 10/26/18 Cardiac Cath - 03/07/19  Sleep Study - patient denies CPAP -   Fasting Blood Sugar - n/a Checks Blood Sugar _____ times a day  Blood Thinner Instructions: last dose of eliquis was 05/02/19 Aspirin Instructions: n/a  Anesthesia review: yes, cardiac history  Patient denies shortness of breath, fever, cough and chest pain at PAT appointment   Patient verbalized understanding of instructions that were given to them at the PAT appointment. Patient was also instructed that they will need to review over the PAT instructions again at home before surgery.

## 2019-05-07 LAB — UPEP/UIFE/LIGHT CHAINS/TP, 24-HR UR
% BETA, Urine: 6.9 %
ALPHA 1 URINE: 1.6 %
Albumin, U: 10.2 %
Alpha 2, Urine: 4.4 %
Free Kappa Lt Chains,Ur: 877.02 mg/L — ABNORMAL HIGH (ref 0.63–113.79)
Free Kappa/Lambda Ratio: 1124.38 — ABNORMAL HIGH (ref 1.03–31.76)
Free Lambda Lt Chains,Ur: 0.78 mg/L (ref 0.47–11.77)
GAMMA GLOBULIN URINE: 76.9 %
M-SPIKE %, Urine: 41 % — ABNORMAL HIGH
M-Spike, Mg/24 Hr: 167 mg/(24.h) — ABNORMAL HIGH
Total Protein, Urine-Ur/day: 407 mg/(24.h) — ABNORMAL HIGH (ref 30–150)
Total Protein, Urine: 22.6 mg/dL
Total Volume: 850

## 2019-05-07 MED ORDER — NOREPINEPHRINE 4 MG/250ML-% IV SOLN
0.0000 ug/min | INTRAVENOUS | Status: AC
  Administered 2019-05-08: 1 ug/min via INTRAVENOUS
  Filled 2019-05-07: qty 250

## 2019-05-07 MED ORDER — VANCOMYCIN HCL 10 G IV SOLR
1500.0000 mg | INTRAVENOUS | Status: AC
  Administered 2019-05-08: 1500 mg via INTRAVENOUS
  Filled 2019-05-07: qty 1500

## 2019-05-07 MED ORDER — POTASSIUM CHLORIDE 2 MEQ/ML IV SOLN
80.0000 meq | INTRAVENOUS | Status: DC
  Filled 2019-05-07: qty 40

## 2019-05-07 MED ORDER — MAGNESIUM SULFATE 50 % IJ SOLN
40.0000 meq | INTRAMUSCULAR | Status: DC
  Filled 2019-05-07: qty 9.85

## 2019-05-07 MED ORDER — SODIUM CHLORIDE 0.9 % IV SOLN
INTRAVENOUS | Status: DC
  Filled 2019-05-07: qty 30

## 2019-05-07 MED ORDER — DEXMEDETOMIDINE HCL IN NACL 400 MCG/100ML IV SOLN
0.1000 ug/kg/h | INTRAVENOUS | Status: AC
  Administered 2019-05-08: 1 ug/kg/h via INTRAVENOUS
  Filled 2019-05-07 (×2): qty 100

## 2019-05-07 MED ORDER — SODIUM CHLORIDE 0.9 % IV SOLN
1.5000 g | INTRAVENOUS | Status: AC
  Administered 2019-05-08: 1.5 g via INTRAVENOUS
  Filled 2019-05-07: qty 1.5

## 2019-05-07 NOTE — H&P (Signed)
[address]       [place] [address]             [phone]      Cardiothoracic Surgery Admission History and Physical   Referring Provider is Larey Dresser, MD  Primary Cardiologist is No primary care provider on file.  PCP is [name], [name], MD      Chief Complaint  Patient presents with   Aortic Stenosis      HPI:  The patient is a 78 year old gentleman with a history of hypertension, hyperlipidemia, paroxysmal atrial fibrillation on anticoagulation, and bicuspid aortic valve with severe aortic stenosis. He has been followed by Dr. Aundra Dubin. His last echocardiogram on 10/26/2018 showed a mean aortic valve gradient of 48 mmHg with a valve area of 0.8 cm. Left ventricular ejection fraction was 60 to 65%. He had a cardiac CT in February 2020 which showed a calcium score of 41 and nonobstructive coronary disease. There was an ascending aortic aneurysm with maximum diameter of 4.3 cm.  The patient reports a several month history of dyspnea with moderate exertion and fatigue is seem to be progressing. He has had no chest discomfort. He denies lower extremity edema. He has had no orthopnea or PND. He denies any dizziness or syncope. He was referred to Dr. Angelena Form for consideration of TAVR.  Unfortunately his preop abdominal and pelvic CTA showed multiple lucent lesions in the pelvic bones bilaterally concerning for a malignant process.  He subsequently had a PET scan which showed diffuse osseous metastatic disease involving the pelvis as well as the spine and ribs.  He underwent a needle biopsy by interventional radiology and the pathology was consistent with multiple myeloma.  He was seen by medical oncology and was felt that he could safely undergo TAVR prior to beginning treatment for his multiple myeloma.  He lives with his wife in [place]. 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      Past Medical History:  Diagnosis  Date   Acute blood loss anemia    Adenomatous colon polyp    Ascending aortic aneurysm (HCC)    Bicuspid aortic valve    Chest pain    ETT-myoview 12/11 w/exercise, no chest pain, no significant ST changes, EF 69%, no evidence for ischemia or infarction.   CHF NYHA class I (no symptoms from ordinary activities), acute, diastolic (HCC)    Fatty liver    mild   GI bleed    GI bleeding 07/21/2018   post polypectomy   Hemorrhoids    HTN (hypertension)    Hypercholesteremia    Hypokalemia    Internal hemorrhoids    LBP (low back pain)    Moderate aortic stenosis    Osteoarthritis    Paroxysmal atrial fibrillation ([place])    a. new onset Afib in 07/2008. He underwent ibutilide cardioversion successfully. b. Recurrence 01/2013 s/p TEE/DCCV - was on Xarelto but he stopped it as he was convinced it was causing joint pn. c. Recurrence 01/2016 - spont conv to NSR. Pt took Eliquis x1 mo then declined further anticoag. d. Recurrence 07/2016.   Pneumonia    Tubular adenoma of colon         Past Surgical History:  Procedure Laterality Date   BACK SURGERY  x12 years ago   CARDIOVERSION N/A 01/26/2013   Procedure: CARDIOVERSION; Surgeon: Larey Dresser, MD; Location: [facility]; Service: Cardiovascular; Laterality: N/A;   CARDIOVERSION  N/A 10/28/2017   Procedure: CARDIOVERSION; Surgeon: Larey Dresser, MD; Location: [facility] ENDOSCOPY; Service: Cardiovascular; Laterality: N/A;   CARDIOVERSION N/A 03/03/2018   Procedure: CARDIOVERSION; Surgeon: Lelon Perla, MD; Location: [facility] ENDOSCOPY; Service: Cardiovascular; Laterality: N/A;   COLONOSCOPY     COLONOSCOPY  07/17/2018   at [facility]     POLYPECTOMY     RIGHT/LEFT HEART CATH AND CORONARY ANGIOGRAPHY N/A 03/07/2019   Procedure: RIGHT/LEFT HEART CATH AND CORONARY ANGIOGRAPHY; Surgeon: Burnell Blanks, MD; Location: [facility] LAB; Service: Cardiovascular; Laterality:  N/A;   TEE WITHOUT CARDIOVERSION N/A 01/26/2013   Procedure: TRANSESOPHAGEAL ECHOCARDIOGRAM (TEE); Surgeon: Larey Dresser, MD; Location: [facility]; Service: Cardiovascular; Laterality: N/A;   TEE WITHOUT CARDIOVERSION N/A 10/28/2017   Procedure: TRANSESOPHAGEAL ECHOCARDIOGRAM (TEE); Surgeon: Larey Dresser, MD; Location: [facility] ENDOSCOPY; Service: Cardiovascular; Laterality: N/A;        Family History  Problem Relation Age of Onset   Colon cancer Mother 37   Hypertension Other    Coronary artery disease Neg Hx    Colon polyps Neg Hx    Esophageal cancer Neg Hx    Rectal cancer Neg Hx    Stomach cancer Neg Hx    Social History        Socioeconomic History   Marital status: Married    Spouse name: Not on file   Number of children: 0   Years of education: Not on file   Highest education level: Not on file  Occupational History   Occupation: Retired Lobbyist: [employer] resource strain: Not on file   Food insecurity    Worry: Not on file    Inability: Not on file   Transportation needs    Medical: Not on file    Non-medical: Not on file  Tobacco Use   Smoking status: Never Smoker   Smokeless tobacco: Never Used  Substance and Sexual Activity   Alcohol use: Yes    Comment: Drinks 1 glass of wine nightly/socially   Drug use: No   Sexual activity: Yes  Lifestyle   Physical activity    Days per week: Not on file    Minutes per session: Not on file   Stress: Not on file  Relationships   Social connections    Talks on phone: Not on file    Gets together: Not on file    Attends religious service: Not on file    Active member of club or organization: Not on file    Attends meetings of clubs or organizations: Not on file    Relationship status: Not on file   Intimate partner violence    Fear of current or ex partner: Not on file    Emotionally abused: Not on file    Physically abused: Not on  file    Forced sexual activity: Not on file  Other Topics Concern   Not on file  Social History Narrative   Patient lives in [place] w/ his wife. He is a native of [place]. 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   Right-handed   Caffeine: 2 cups coffee per day         Current Outpatient Medications  Medication Sig Dispense Refill   famotidine (PEPCID) 40 MG tablet Take 1 tablet (40 mg total) by mouth daily. 30 tablet 11   hydrochlorothiazide (  MICROZIDE) 12.5 MG capsule Take 12.5 mg by mouth daily as needed (swelling).      HYDROcodone-acetaminophen (NORCO/VICODIN) 5-325 MG tablet Take 1 tablet by mouth every 6 (six) hours as needed for severe pain. 60 tablet 0   ibuprofen (ADVIL) 200 MG tablet Take 400 mg by mouth every 6 (six) hours as needed for moderate pain.     loratadine (CLARITIN) 10 MG tablet Take 1 tablet (10 mg total) by mouth daily. 30 tablet 11   metoprolol succinate (TOPROL-XL) 50 MG 24 hr tablet TAKE 1 TABLET (50 MG TOTAL) BY MOUTH 2 (TWO) TIMES DAILY. TAKE WITH OR IMMEDIATELY FOLLOWING A MEAL. 180 tablet 3   predniSONE (DELTASONE) 5 MG tablet Take 3 tablets (15 mg total) by mouth daily with breakfast. 90 tablet 3   Vitamin D, Cholecalciferol, 50 MCG (2000 UT) CAPS Take 2,000 Units by mouth daily.     apixaban (ELIQUIS) 5 MG TABS tablet Take 1 tablet (5 mg total) by mouth 2 (two) times daily. (Patient not taking: Reported on 03/28/2019) 60 tablet 6   No current facility-administered medications for this visit.         Allergies  Allergen Reactions   Xarelto [Rivaroxaban] Other (See Comments) and Hypertension    INCREASED BP-HYPERTENSIVE EVENTS   Corticosteroids Other (See Comments)    Made the patient "sick," feel "weird," and his "body rejected" them    Ramipril Other (See Comments)    Could not eat or sleep, lost muscle mass   Benazepril Cough   Review of Systems:   General: normal appetite, + decreased energy,  no weight gain, no weight loss, no fever  Cardiac: no chest pain with exertion, no chest pain at rest, +SOB with moderate exertion, no resting SOB, no PND, no orthopnea, no palpitations, + arrhythmia, + atrial fibrillation, no LE edema, no dizzy spells, no syncope  Respiratory: + exertional shortness of breath, no home oxygen, no productive cough, no dry cough, no bronchitis, no wheezing, no hemoptysis, no asthma, no pain with inspiration or cough, no sleep apnea, no CPAP at night  GI: no difficulty swallowing, no reflux, no frequent heartburn, no hiatal hernia, no abdominal pain, no constipation, no diarrhea, no hematochezia, no hematemesis, no melena  GU: no dysuria, no frequency, no urinary tract infection, no hematuria, no enlarged prostate, no kidney stones, no kidney disease  Vascular: no pain suggestive of claudication, no pain in feet, no leg cramps, no varicose veins, no DVT, no non-healing foot ulcer  Neuro: no stroke, no TIA's, no seizures, no headaches, no temporary blindness one eye, no slurred speech, no peripheral neuropathy, no chronic pain, no instability of gait, no memory/cognitive dysfunction  Musculoskeletal: no arthritis, no joint swelling, no myalgias, no difficulty walking, normal mobility  Skin: no rash, no itching, no skin infections, no pressure sores or ulcerations  Psych: no anxiety, no depression, no nervousness, no unusual recent stress  Eyes: no blurry vision, no floaters, no recent vision changes, does not wear glasses or contacts  ENT: no hearing loss, no loose or painful teeth, no dentures, sees dentist every 6 months.  Hematologic: no easy bruising, no abnormal bleeding, no clotting disorder, no frequent epistaxis  Endocrine: no diabetes, does not check CBG's at home   Physical Exam:   BP (!) 143/79 (BP Location: Right Arm, Patient Position: Sitting, Cuff Size: Normal)   Pulse 60   Temp 97.7 F (36.5 C) Comment: thermal   Resp 16   Ht 5' 10.5" (1.791 m)  Wt 193  lb (87.5 kg)   SpO2 96% Comment: RA   BMI 27.30 kg/m  General: well-appearing, looks younger than age  17: Unremarkable, NCAT, PERLA, EOMI, oropharynx clear, teeth in good codition  Neck: no JVD, no bruits, no adenopathy or thyromegaly  Chest: clear to auscultation, symmetrical breath sounds, no wheezes, no rhonchi  CV: RRR, grade III/VI crescendo/decrescendo murmur heard best at RSB, no diastolic murmur  Abdomen: soft, non-tender, no masses  Extremities: warm, well-perfused, pulses palpable in feet, no LE edema  Rectal/GU Deferred  Neuro: Grossly non-focal and symmetrical throughout  Skin: Clean and dry, no rashes, no breakdown    Diagnostic Tests:   Patient Name: [facility] Date of Exam: 10/26/2018  Medical Rec #: [id number] Height: 70.0 in  Accession #: [id number] Weight: 191.0 lb  Date of Birth: 16-Dec-1940 BSA: 2.05 m  Patient Age: 36 years BP: 129/70 mmHg  Patient Gender: M HR: 59 bpm.  Exam Location: Outpatient  Procedure: 2D Echo  Indications: I35.0 Nonrheumatic aortic (valve) stenosis  History: Patient has prior history of Echocardiogram examinations, most  recent 10/28/2017. CAD, Atrial Fibrillation; Risk Factors:  Hypertension and Dyslipidemia. Bicuspid aortic valve.  Sonographer: Talmage Coin  Referring Phys: [place]  1. The left ventricle has normal systolic function with an ejection fraction of 60-65%. The cavity size was normal. There is mildly increased left ventricular wall thickness. Left ventricular diastolic Doppler parameters are consistent with  pseudonormalization No evidence of left ventricular regional wall motion abnormalities.  2. The right ventricle has normal systolic function. The cavity was normal. There is no increase in right ventricular wall thickness.  3. The mitral valve is normal in structure. There is mild mitral annular calcification present. No evidence of mitral valve stenosis. Trivial regurgitation.  4. The  aortic valveis bicuspid There is severe calcifcation of the aortic valve. There is severe stenosis of the aortic valve. AVA 0.8 cm^2 with mean gradient 48 mmHg.  5. The pulmonic valve was normal in structure.  6. The aortic root and ascending aorta are normal in size and structure.  7. Right atrial pressure is estimated at 8 mmHg.  8. Left atrial size was mildly dilated.  9. Right atrial size was mildly dilated.  10. The tricuspid valve is normal in structure.  11. No evidence of left ventricular regional wall motion abnormalities.  12. The inferior vena cava was normal in size with <50% respiratory variability.  FINDINGS  Left Ventricle: The left ventricle has normal systolic function, with an ejection fraction of 60-65%. The cavity size was normal. There is mildly increased left ventricular wall thickness. Left ventricular diastolic Doppler parameters are consistent  with pseudonormalization No evidence of left ventricular regional wall motion abnormalities..  Right Ventricle: The right ventricle has normal systolic function. The cavity was normal. There is no increase in right ventricular wall thickness.  Left Atrium: left atrial size was mildly dilated  Right Atrium: right atrial size was mildly dilated Right atrial pressure is estimated at 8 mmHg.  Interatrial Septum: No atrial level shunt detected by color flow Doppler.  Pericardium: There is no evidence of pericardial effusion.  Mitral Valve: The mitral valve is normal in structure. There is mild mitral annular calcification present. Mitral valve regurgitation is trivial by color flow Doppler. No evidence of mitral valve stenosis.  Tricuspid Valve: The tricuspid valve is normal in structure. Tricuspid valve regurgitation is trivial by color flow Doppler.  Aortic Valve: The aortic valveis bicuspid Aortic  valve regurgitation is trivial by color flow Doppler. There is severe stenosis of the aortic valve, with a calculated valve area of 0.83  cm.  Pulmonic Valve: The pulmonic valve was normal in structure. Pulmonic valve regurgitation is not visualized by color flow Doppler.  Aorta: The aortic root and ascending aorta are normal in size and structure.  Venous: The inferior vena cava is normal in size with less than 50% respiratory variability.  LEFT VENTRICLE  PLAX 2D (Teich) Biplane EF (MOD)  LV EF: 68.6 % LV Biplane EF: 68.4 %  LVIDd: 5.08 cm LV A4C EF: 65.0 %  LVIDs: 3.12 cm LV A2C EF: 71.0 %  LV PW: 1.11 cm  LV IVS: 1.07 cm Diastology  LVOT diam: 1.90 cm LV e' lateral: 7.51 cm/s  LV SV: 84 ml LV E/e' lateral: 13.2  LVOT Area: 2.84 cm LV e' medial: 5.87 cm/s  LV E/e' medial: 16.9  LV Volumes (MOD)  LV area d, A2C: 27.40 cm  LV area d, A4C: 33.30 cm  LV area s, A2C: 13.30 cm  LV area s, A4C: 17.40 cm  LV major d, A2C: 7.89 cm  LV major d, A4C: 8.09 cm  LV major s, A2C: 6.50 cm  LV major s, A4C: 6.46 cm  LV vol d, MOD A2C: 83.5 ml  LV vol d, MOD A4C: 114.0 ml  LV vol s, MOD A2C: 24.2 ml  LV vol s, MOD A4C: 39.9 ml  LV SV MOD A2C: 59.3 ml  LV SV MOD A4C: 114.0 ml  LV SV MOD BP: 66.9 ml  RIGHT VENTRICLE  RV S prime: 12.50 cm/s  TAPSE (M-mode): 2.3 cm  RVSP: 38.5 mmHg  LEFT ATRIUM Index RIGHT ATRIUM Index  LA diam: 4.80 cm 2.35 cm/m RA Pressure: 8 mmHg  LA Vol (A2C): 72.6 ml 35.47 ml/m RA Area: 24.20 cm  LA Vol (A4C): 60.1 ml 29.36 ml/m RA Volume: 76.20 ml 37.23 ml/m  LA Biplane Vol: 66.7 ml 32.59 ml/m  AORTIC VALVE  AV Area (Vmax): 0.93 cm  AV Area (Vmean): 0.92 cm  AV Area (VTI): 0.83 cm  AV Vmax: 405.75 cm/s  AV Vmean: 304.250 cm/s  AV VTI: 0.953 m  AV Peak Grad: 65.9 mmHg  AV Mean Grad: 48.0 mmHg  LVOT Vmax: 133.00 cm/s  LVOT Vmean: 98.500 cm/s  LVOT VTI: 0.279 m  LVOT/AV VTI ratio: 0.29  AORTA  Ao Root diam: 3.50 cm  MITRAL VALVE TRICUSPID VALVE  MV Area (PHT): 2.37 cm TR Peak grad: 30.5 mmHg  MV PHT: 92.80 msec TR Vmax: 276.00 cm/s  MV Decel Time: 320 msec RVSP: 38.5 mmHg  MV  E velocity: 99.40 cm/s  MV A velocity: 76.60 cm/s  MV E/A ratio: 1.30  [name] MD  Electronically signed by [name] MD  Signature Date/Time: 10/26/2018/6:24:47 PM     Panel Physicians Referring Physician Case Authorizing Physician  Burnell Blanks, MD (Primary)    Procedures  RIGHT/LEFT HEART CATH AND CORONARY ANGIOGRAPHY  Conclusion  1. No angiographic evidence of CAD  2. Severe aortic stenosis ( mean gradient 49.4 mmHg, peak to peak gradient 78 mmHg).  3. Right heart cath (RA 6, RV 52/3/9, PA58/21/35, no wedge obtained due to inability to wedge the catheter)  Recommendations: Continue with TAVR workup.   Recommendations  Antiplatelet/Anticoag Continue with TAVR workup  Indications  Severe aortic stenosis [I35.0 (ICD-10-CM)]  Procedural Details  Technical Details Indication: Severe AS  Procedure: The risks, benefits, complications, treatment options, and expected  outcomes were discussed with the patient. The patient and/or family concurred with the proposed plan, giving informed consent. The patient was brought to the cath lab after IV hydration was given. The patient was sedated with Versed and Fentanyl. The IV catheter in the right antecubital vein was prepped and draped and changed for a 5 French sheath. Right heart cath performed with a balloon tipped catheter. The right wrist was prepped and draped in a sterile fashion. 1% lidocaine was used for local anesthesia. Using the modified Seldinger access technique, a 5 French sheath was placed in the right radial artery. 3 mg Verapamil was given through the sheath. 4500 units IV heparin was given. Standard diagnostic catheters were used to perform selective coronary angiography. I engaged the left main with the JL3.5 but it was selective in the Circumflex so a EBU 3.5 was used to engage the LAD. The aortic valve was crossed with an AL-1 and a straight wire. The sheath was removed from the right radial artery and a Terumo  hemostasis band was applied at the arteriotomy site on the right wrist.    Estimated blood loss <50 mL.   During this procedure medications were administered to achieve and maintain moderate conscious sedation while the patient's heart rate, blood pressure, and oxygen saturation were continuously monitored and I was present face-to-face 100% of this time.  Medications  (Filter: Administrations occurring from 03/07/19 0809 to 03/07/19 1001)          (important) Continuous medications are totaled by the amount administered until 03/07/19 1001.  Medication Rate/Dose/Volume Action  Date Time   midazolam (VERSED) injection (mg) 1 mg Given 03/07/19 0905   Total dose as of 03/07/19 1001 1 mg Given 0917   2 mg        fentaNYL (SUBLIMAZE) injection (mcg) 25 mcg Given 03/07/19 0905   Total dose as of 03/07/19 1001 25 mcg Given 0917   50 mcg        lidocaine (PF) (XYLOCAINE) 1 % injection (mL) 2 mL Given 03/07/19 0916   Total dose as of 03/07/19 1001 2 mL Given 0926   4 mL        Heparin (Porcine) in NaCl 1000-0.9 UT/500ML-% SOLN (mL) 500 mL Given 03/07/19 0918   Total dose as of 03/07/19 1001 500 mL Given 0918   1,000 mL        Radial Cocktail/Verapamil only (mL) 10 mL Given 03/07/19 0928   Total dose as of 03/07/19 1001        10 mL        heparin injection (Units) 4,500 Units Given 03/07/19 0934   Total dose as of 03/07/19 1001        4,500 Units        iohexol (OMNIPAQUE) 350 MG/ML injection (mL) 80 mL Given 03/07/19 0953   Total dose as of 03/07/19 1001        80 mL        Sedation Time  Sedation Time Physician-1: 45 minutes 44 seconds  Complications  Complications documented before study signed (03/07/2019 10:04 AM)   RIGHT/LEFT HEART CATH AND CORONARY ANGIOGRAPHY   None Documented by Burnell Blanks, MD 03/07/2019 9:59 AM  Date Found: 03/07/2019  Time Range: Intraprocedure    Coronary Findings  Diagnostic  Dominance: Left  Left Anterior Descending  Vessel is large.    Left Circumflex  Vessel is large.  Right Coronary Artery  Vessel is large.  Intervention  No interventions have  been documented.  Coronary Diagrams  Diagnostic  Dominance: Left   Intervention  Implants     No implant documentation for this case.  [facility] Images  Link to Procedure Log   Show images for CARDIAC CATHETERIZATION Procedure Log  Images on Long Term Storage    Show images for [place], [place]   Hemo Data   Most Recent Value  Fick Cardiac Output 7.73 L/min  Fick Cardiac Output Index 3.79 (L/min)/BSA  Aortic Mean Gradient 49.43 mmHg  Aortic Peak Gradient 78 mmHg  Aortic Valve Area 1.00  Aortic Value Area Index 0.49 cm2/BSA  RA A Wave 10 mmHg  RA V Wave 8 mmHg  RA Mean 6 mmHg  RV Systolic Pressure 52 mmHg  RV Diastolic Pressure 3 mmHg  RV EDP 9 mmHg  PA Systolic Pressure 56 mmHg  PA Diastolic Pressure 21 mmHg  PA Mean 35 mmHg  AO Systolic Pressure 502 mmHg  AO Diastolic Pressure 68 mmHg  AO Mean 93 mmHg  LV Systolic Pressure 774 mmHg  LV Diastolic Pressure 23 mmHg  LV EDP 29 mmHg  AOp Systolic Pressure 128 mmHg  AOp Diastolic Pressure 72 mmHg  AOp Mean Pressure 786 mmHg  LVp Systolic Pressure 767 mmHg  LVp Diastolic Pressure 22 mmHg  LVp EDP Pressure 30 mmHg  QP/QS 1  TPVR Index 9.24 HRUI  TSVR Index 24.55 HRUI  TPVR/TSVR Ratio 0.38    ADDENDUM REPORT: 03/14/2019 15:33  CLINICAL DATA: Aortic stenosis  EXAM:  Cardiac TAVR CT  TECHNIQUE:  The patient was scanned on a Siemens Force 209 slice scanner. A 120  kV retrospective scan was triggered in the descending thoracic aorta  at 111 HU's. Gantry rotation speed was 270 msecs and collimation was  .9 mm. No beta blockade or nitro were given. The 3D data set was  reconstructed in 5% intervals of the R-R cycle. Systolic and  diastolic phases were analyzed on a dedicated work station using  MPR, MIP and VRT modes. The patient received 80 cc of contrast.  FINDINGS:  Aortic Valve: Appears tri leaflet with  only partial fusion of the  left and right cusps Calcified with restricted leaflet motion  Aorta: Mild aortic root dilatation with mild calcific  atherosclerosis and normal arch vessels  Sinotubular Junction: 30 mm  Ascending Thoracic Aorta: 44 mm  Aortic Arch: 25 mm  Descending Thoracic Aorta: 30 mm  Sinus of Valsalva Measurements:  Non-coronary: 32.5 mm  Right - coronary: 29.3 mm  Left - coronary: 34.3 mm  Coronary Artery Height above Annulus:  Left Main: Shallow at 8.6 mm above annulus on minimal measurement  Right Coronary: 14 mm above annulus  Virtual Basal Annulus Measurements:  Maximum/Minimum Diameter: 29 mm x 18.8 mm  Perimeter: 79 mm  Area: 435 mm2  Coronary Arteries: LM shallow  Optimum Fluoroscopic Angle for Delivery: LAO 23 Caudal 24 degrees  IMPRESSION:  1. Tri leaflet AV with partially fused left and right cusps Annular  area of 435 mm2 suitable for a 26 mm Sapien 3 valve  2. Dilated aortic root 4.4 cm  3. Shallow LM coronary artery 8.6 mm above annulus  4. Optimum angiographic angle for deployment LAO 23 Caudal 24  degrees  Jenkins Rouge  Electronically Signed  By: Jenkins Rouge M.D.  On: 03/14/2019 15:33   Addended by Josue Hector, MD on 03/14/2019 3:35 PM   Study Result   EXAM:  OVER-READ INTERPRETATION CT CHEST  The following report is an over-read performed by radiologist  Dr.  Vinnie Langton of [facility], [place] on 03/14/2019. This  over-read does not include interpretation of cardiac or coronary  anatomy or pathology. The coronary calcium score/coronary CTA  interpretation by the cardiologist is attached.  COMPARISON: Cardiac CTA 10/24/2018.  FINDINGS:  Extracardiac findings will be described separately under dictation  for contemporaneously obtained CTA chest, abdomen and pelvis.  IMPRESSION:  Please see separate dictation for contemporaneously obtained CTA  chest, abdomen and pelvis dated 03/14/2019 for full description of  relevant  extracardiac findings.  Electronically Signed:  By: Vinnie Langton M.D.  On: 03/14/2019 12:58   CLINICAL DATA: 78 year old male with history of severe aortic  stenosis. Preprocedural study prior to potential transcatheter  aortic valve replacement (TAVR) procedure.  EXAM:  CT ANGIOGRAPHY CHEST, ABDOMEN AND PELVIS  TECHNIQUE:  Multidetector CT imaging through the chest, abdomen and pelvis was  performed using the standard protocol during bolus administration of  intravenous contrast. Multiplanar reconstructed images and MIPs were  obtained and reviewed to evaluate the vascular anatomy.  CONTRAST: 174m OMNIPAQUE IOHEXOL 350 MG/ML SOLN  COMPARISON: Cardiac CTA 10/24/2018. Chest CT 10/20/2016. CT the  abdomen and pelvis 12/21/2004.  FINDINGS:  CTA CHEST FINDINGS  Cardiovascular: Heart size is enlarged. Concentric left ventricular  hypertrophy. There is no significant pericardial fluid, thickening  or pericardial calcification. There is aortic atherosclerosis, as  well as atherosclerosis of the great vessels of the mediastinum and  the coronary arteries, including calcified atherosclerotic plaque in  the left main, left anterior descending and right coronary arteries.  Severe thickening and calcification of the aortic valve. Mild  aneurysmal dilatation of the ascending thoracic aorta (4.5 cm in  diameter).  Mediastinum/Lymph Nodes: No pathologically enlarged mediastinal or  hilar lymph nodes. Small hiatal hernia. No axillary lymphadenopathy.  Lungs/Pleura: Areas of mild linear scarring in the mid lungs  bilaterally. No acute consolidative airspace disease. No pleural  effusions. A few scattered 2-3 mm pulmonary nodules are noted in the  lungs bilaterally, nonspecific. No other larger more suspicious  appearing pulmonary nodules or masses are noted.  Musculoskeletal/Soft Tissues: There are no aggressive appearing  lytic or blastic lesions noted in the visualized portions of the    skeleton.  CTA ABDOMEN AND PELVIS FINDINGS  Hepatobiliary: No suspicious cystic or solid hepatic lesions. No  intra or extrahepatic biliary ductal dilatation. Gallbladder is  normal in appearance.  Pancreas: No pancreatic mass. No pancreatic ductal dilatation. No  pancreatic or peripancreatic fluid or inflammatory changes.  Spleen: Unremarkable.  Adrenals/Urinary Tract: Low-attenuation lesions in both kidneys,  compatible with simple cysts, largest of which is in the interpolar  region of the left kidney measuring 3.4 cm in diameter. Other  subcentimeter low-attenuation lesions in the right kidney are too  small to definitively characterize, but are statistically likely to  represent tiny cysts. Bilateral adrenal glands are normal in  appearance. No hydroureteronephrosis. Urinary bladder is mildly  trabeculated with multiple small bladder wall diverticuli.  Stomach/Bowel: Normal appearance of the stomach. No pathologic  dilatation of small bowel or colon. Normal appendix.  Vascular/Lymphatic: Aortic atherosclerosis, without evidence of  aneurysm or dissection in the abdominal or pelvic vasculature.  Vascular findings and measurements pertinent to potential TAVR  procedure, as detailed below. No lymphadenopathy noted in the  abdomen or pelvis.  Reproductive: Prostate gland and seminal vesicles are unremarkable  in appearance.  Other: No significant volume of ascites. No pneumoperitoneum.  Musculoskeletal: Unusual lesions in the iliac bones bilaterally, and  in the right  ischium. These range from centrally lucent to  sclerotic. The largest lesion is in the right ilium above the right  acetabulum measuring at least 6.3 x 3.8 cm with central enhancing  soft tissue or other high attenuation material. The lesion in the  anterior aspect of the left ilium has central fatty attenuation  (axial image 194 of series 15). The lesion in the right ischium  demonstrates cortical breakthrough  (axial image 207 of series 15).  VASCULAR MEASUREMENTS PERTINENT TO TAVR:  AORTA:  Minimal Aortic Diameter-14 x 13 mm  Severity of Aortic Calcification-moderate  RIGHT PELVIS:  Right Common Iliac Artery -  Minimal Diameter-10.7 x 10.0 mm  Tortuosity-mild  Calcification-mild  Right External Iliac Artery -  Minimal Diameter-7.7 x 7.9 mm  Tortuosity-severe  Calcification-none  Right Common Femoral Artery -  Minimal Diameter-8.0 x 8.2 mm  Tortuosity-mild  Calcification-minimal  LEFT PELVIS:  Left Common Iliac Artery -  Minimal Diameter-10.3 x 9.6 mm  Tortuosity-mild  Calcification-mild  Left External Iliac Artery -  Minimal Diameter-8.5 x 7.8 mm  Tortuosity-moderate to severe  Calcification-none  Left Common Femoral Artery -  Minimal Diameter-8.2 x 8.1 mm  Tortuosity-mild  Calcification-minimal  Review of the MIP images confirms the above findings.  IMPRESSION:  1. Vascular findings and measurements pertinent to potential TAVR  procedure, as detailed above.  2. Severe thickening calcification of the aortic valve, compatible  with the reported clinical history of severe aortic stenosis.  3. Aortic atherosclerosis, in addition to left main and 2 vessel  coronary artery disease. In addition, there is mild aneurysmal  dilatation of the ascending thoracic aorta (4.5 cm in diameter.  Ascending thoracic aortic aneurysm. Recommend semi-annual imaging  followup by CTA or MRA and referral to cardiothoracic surgery if not  already obtained. This recommendation follows 2010  ACCF/AHA/AATS/ACR/ASA/SCA/SCAI/SIR/STS/SVM Guidelines for the  Diagnosis and Management of Patients With Thoracic Aortic Disease.  Circulation. 2010; 121: I948-N462. Aortic aneurysm NOS (ICD10-I71.9)  4. Indeterminate osseous lesions in the bony pelvis which warrants  further characterization with nonemergent MRI of the pelvis with and  without IV gadolinium in the near future to exclude an aggressive  process  such as multiple myeloma or metastatic disease.  5. Cardiomegaly with concentric left ventricular hypertrophy.  6. Scattered 2-3 mm pulmonary nodules in the lungs bilaterally,  nonspecific but statistically likely benign. No follow-up needed if  patient is low-risk (and has no known or suspected primary  neoplasm). Non-contrast chest CT can be considered in 12 months if  patient is high-risk. This recommendation follows the consensus  statement: Guidelines for Management of Incidental Pulmonary Nodules  Detected on CT Images: From the Fleischner Society 2017; Radiology  2017; 284:228-243.  Electronically Signed  By: Vinnie Langton M.D.  On: 03/14/2019 13:40    STS Risk Score: AVR   Risk of Mortality:  1.281%  Renal Failure:  1.189%  Permanent Stroke:  0.761%  Prolonged Ventilation:  3.997%  DSW Infection:  0.108%  Reoperation:  3.223%  Morbidity or Mortality:  7.573%  Short Length of Stay:  41.333%  Long Length of Stay:  3.892%  Impression:   This 78 year old gentleman has stage D, severe, symptomatic aortic stenosis with New York Heart Association class II symptoms of progressive exertional fatigue and shortness of breath consistent with chronic diastolic congestive heart failure. I have personally reviewed his 2D echocardiogram, cardiac catheterization, and CTA studies. His echocardiogram shows a bicuspid aortic valve with calcified leaflets and restricted mobility. The mean gradient across aortic  valve is 48 mmHg consistent with severe aortic stenosis. Left ventricular systolic function is normal. Cardiac catheterization shows no coronary disease. The mean gradient across the aortic valve was 49 mmHg consistent with severe aortic stenosis. I agree that aortic valve replacement is indicated in this active patient to prevent progressive functional decline and left ventricular deterioration. Given his age I think transcatheter aortic valve replacement would be the best option  for him. His gated cardiac CTA shows anatomy suitable for transcatheter aortic valve replacement although his left main minimum height was only measured at 8.6 mm. We will need to review this measurement. His abdominal and pelvic CTA shows adequate pelvic vascular anatomy to allow transfemoral insertion.  The patient was counseled at length regarding treatment alternatives for management of severe symptomatic aortic stenosis. The risks and benefits of surgical intervention has been discussed in detail. Long-term prognosis with medical therapy was discussed. Alternative approaches such as conventional surgical aortic valve replacement, transcatheter aortic valve replacement, and palliative medical therapy were compared and contrasted at length. This discussion was placed in the context of the patient's own specific clinical presentation and past medical history. All of his questions have been addressed.  Following the decision to proceed with transcatheter aortic valve replacement, a discussion was held regarding what types of management strategies would be attempted intraoperatively in the event of life-threatening complications, including whether or not the patient would be considered a candidate for the use of cardiopulmonary bypass and/or conversion to open sternotomy for attempted surgical intervention. He is in overall good condition for his age and has low surgical risk for open surgical aortic valve replacement so I think he would be a candidate for emergent sternotomy to manage any intraoperative complications.  The patient has been advised of a variety of complications that might develop including but not limited to risks of death, stroke, paravalvular leak, aortic dissection or other major vascular complications, aortic annulus rupture, device embolization, cardiac rupture or perforation, mitral regurgitation, acute myocardial infarction, arrhythmia, heart block or bradycardia requiring permanent  pacemaker placement, congestive heart failure, respiratory failure, renal failure, pneumonia, infection, other late complications related to structural valve deterioration or migration, or other complications that might ultimately cause a temporary or permanent loss of functional independence or other long term morbidity. The patient provides full informed consent for the procedure as described and all questions were answered.   Plan:   Transfemoral transcatheter aortic valve replacement on Tuesday, 05/08/2019.    Gaye Pollack, MD

## 2019-05-07 NOTE — Anesthesia Preprocedure Evaluation (Addendum)
Anesthesia Evaluation  Patient identified by MRN, date of birth, ID band Patient awake    Reviewed: Allergy & Precautions, NPO status , Patient's Chart, lab work & pertinent test results  Airway Mallampati: III  TM Distance: >3 FB Neck ROM: Full    Dental no notable dental hx.    Pulmonary neg pulmonary ROS,    Pulmonary exam normal breath sounds clear to auscultation       Cardiovascular hypertension, Pt. on medications and Pt. on home beta blockers + CAD and +CHF  + dysrhythmias Atrial Fibrillation + Valvular Problems/Murmurs AS  Rhythm:Regular Rate:Normal + Systolic murmurs ECG: Sinus bradycardia Left ventricular hypertrophy with repolarization abnormality  CATH: 1. No angiographic evidence of CAD 2. Severe aortic stenosis ( mean gradient 49.4 mmHg, peak to peak gradient 78 mmHg).  3. Right heart cath (RA 6, RV 52/3/9, PA58/21/35, no wedge obtained due to inability to wedge the catheter  ECHO: 1. The left ventricle has normal systolic function with an ejection fraction of 60-65%. The cavity size was normal. There is mildly increased left ventricular wall thickness. Left ventricular diastolic Doppler parameters are consistent with  pseudonormalization No evidence of left ventricular regional wall motion abnormalities.  2. The right ventricle has normal systolic function. The cavity was normal. There is no increase in right ventricular wall thickness.  3. The mitral valve is normal in structure. There is mild mitral annular calcification present. No evidence of mitral valve stenosis. Trivial regurgitation.  4. The aortic valveis bicuspid There is severe calcifcation of the aortic valve. There is severe stenosis of the aortic valve. AVA 0.8 cm^2 with mean gradient 48 mmHg.  5. The pulmonic valve was normal in structure.  6. The aortic root and ascending aorta are normal in size and structure.  7. Right atrial pressure is estimated  at 8 mmHg.  8. Left atrial size was mildly dilated.  9. Right atrial size was mildly dilated. 10. The tricuspid valve is normal in structure. 11. No evidence of left ventricular regional wall motion abnormalities. 12. The inferior vena cava was normal in size with <50% respiratory variability.   Neuro/Psych negative neurological ROS  negative psych ROS   GI/Hepatic negative GI ROS, Neg liver ROS,   Endo/Other  negative endocrine ROS  Renal/GU negative Renal ROS     Musculoskeletal  (+) Arthritis , LBP (low back pain)   Abdominal   Peds  Hematology HLD   Anesthesia Other Findings Severe Aortic Stenosis  Reproductive/Obstetrics                           Anesthesia Physical Anesthesia Plan  ASA: IV  Anesthesia Plan: MAC   Post-op Pain Management:    Induction: Intravenous  PONV Risk Score and Plan: 1 and Ondansetron, Dexamethasone, Midazolam and Treatment may vary due to age or medical condition  Airway Management Planned: Simple Face Mask  Additional Equipment: Arterial line  Intra-op Plan:   Post-operative Plan:   Informed Consent: I have reviewed the patients History and Physical, chart, labs and discussed the procedure including the risks, benefits and alternatives for the proposed anesthesia with the patient or authorized representative who has indicated his/her understanding and acceptance.     Dental advisory given  Plan Discussed with: CRNA  Anesthesia Plan Comments: (Newly diagnosed plasma cell myeloma discovered during pre TAVR workup. TAVR postponed and pt referred to heme/onc. He was seen by Dr. Irene Limbo 04/30/19 to discuss diagnosis as well as  ability to proceed with TAVR. Per Dr. Grier Mitts OV note "it would be prudent to proceed with TAVR prior to initiating myeloma treatment to avoid limiting volume overload and other treatment related issues.")      Anesthesia Quick Evaluation

## 2019-05-08 ENCOUNTER — Inpatient Hospital Stay (HOSPITAL_COMMUNITY): Payer: Medicare Other | Admitting: Physician Assistant

## 2019-05-08 ENCOUNTER — Other Ambulatory Visit: Payer: Self-pay

## 2019-05-08 ENCOUNTER — Encounter (HOSPITAL_COMMUNITY)
Admission: RE | Disposition: A | Discharge status: Home / Self Care | Payer: Self-pay | Source: Home / Self Care | Attending: Surgery

## 2019-05-08 ENCOUNTER — Inpatient Hospital Stay (HOSPITAL_COMMUNITY)
Admission: RE | Admit: 2019-05-08 | Discharge: 2019-05-11 | DRG: 266 | Disposition: A | Discharge status: Home / Self Care | Payer: Medicare Other | Attending: Surgery | Admitting: Surgery

## 2019-05-08 ENCOUNTER — Encounter (HOSPITAL_COMMUNITY): Payer: Self-pay | Admitting: Certified Registered Nurse Anesthetist

## 2019-05-08 ENCOUNTER — Other Ambulatory Visit: Payer: Self-pay | Admitting: Physician Assistant

## 2019-05-08 ENCOUNTER — Inpatient Hospital Stay (HOSPITAL_COMMUNITY): Payer: Medicare Other

## 2019-05-08 DIAGNOSIS — J9809 Other diseases of bronchus, not elsewhere classified: Secondary | ICD-10-CM | POA: Diagnosis not present

## 2019-05-08 DIAGNOSIS — Z888 Allergy status to other drugs, medicaments and biological substances status: Secondary | ICD-10-CM | POA: Diagnosis not present

## 2019-05-08 DIAGNOSIS — Z8701 Personal history of pneumonia (recurrent): Secondary | ICD-10-CM | POA: Diagnosis not present

## 2019-05-08 DIAGNOSIS — I110 Hypertensive heart disease with heart failure: Secondary | ICD-10-CM | POA: Diagnosis present

## 2019-05-08 DIAGNOSIS — Z954 Presence of other heart-valve replacement: Secondary | ICD-10-CM | POA: Diagnosis not present

## 2019-05-08 DIAGNOSIS — E7800 Pure hypercholesterolemia, unspecified: Secondary | ICD-10-CM | POA: Diagnosis present

## 2019-05-08 DIAGNOSIS — Z7901 Long term (current) use of anticoagulants: Secondary | ICD-10-CM | POA: Diagnosis not present

## 2019-05-08 DIAGNOSIS — I5033 Acute on chronic diastolic (congestive) heart failure: Secondary | ICD-10-CM

## 2019-05-08 DIAGNOSIS — E876 Hypokalemia: Secondary | ICD-10-CM | POA: Diagnosis not present

## 2019-05-08 DIAGNOSIS — Z7952 Long term (current) use of systemic steroids: Secondary | ICD-10-CM

## 2019-05-08 DIAGNOSIS — I2510 Atherosclerotic heart disease of native coronary artery without angina pectoris: Secondary | ICD-10-CM | POA: Diagnosis not present

## 2019-05-08 DIAGNOSIS — K760 Fatty (change of) liver, not elsewhere classified: Secondary | ICD-10-CM | POA: Diagnosis present

## 2019-05-08 DIAGNOSIS — R079 Chest pain, unspecified: Secondary | ICD-10-CM | POA: Diagnosis not present

## 2019-05-08 DIAGNOSIS — Z952 Presence of prosthetic heart valve: Secondary | ICD-10-CM

## 2019-05-08 DIAGNOSIS — Z8249 Family history of ischemic heart disease and other diseases of the circulatory system: Secondary | ICD-10-CM | POA: Diagnosis not present

## 2019-05-08 DIAGNOSIS — R011 Cardiac murmur, unspecified: Secondary | ICD-10-CM | POA: Diagnosis not present

## 2019-05-08 DIAGNOSIS — I480 Paroxysmal atrial fibrillation: Secondary | ICD-10-CM | POA: Diagnosis present

## 2019-05-08 DIAGNOSIS — Z79899 Other long term (current) drug therapy: Secondary | ICD-10-CM | POA: Diagnosis not present

## 2019-05-08 DIAGNOSIS — R5082 Postprocedural fever: Secondary | ICD-10-CM

## 2019-05-08 DIAGNOSIS — I10 Essential (primary) hypertension: Secondary | ICD-10-CM | POA: Diagnosis not present

## 2019-05-08 DIAGNOSIS — Z800 Family history of malignant neoplasm of digestive organs: Secondary | ICD-10-CM | POA: Diagnosis not present

## 2019-05-08 DIAGNOSIS — M1990 Unspecified osteoarthritis, unspecified site: Secondary | ICD-10-CM | POA: Diagnosis present

## 2019-05-08 DIAGNOSIS — Q231 Congenital insufficiency of aortic valve: Secondary | ICD-10-CM | POA: Diagnosis not present

## 2019-05-08 DIAGNOSIS — Z95828 Presence of other vascular implants and grafts: Secondary | ICD-10-CM | POA: Diagnosis not present

## 2019-05-08 DIAGNOSIS — M353 Polymyalgia rheumatica: Secondary | ICD-10-CM | POA: Diagnosis present

## 2019-05-08 DIAGNOSIS — E785 Hyperlipidemia, unspecified: Secondary | ICD-10-CM | POA: Diagnosis not present

## 2019-05-08 DIAGNOSIS — Z006 Encounter for examination for normal comparison and control in clinical research program: Secondary | ICD-10-CM

## 2019-05-08 DIAGNOSIS — R509 Fever, unspecified: Secondary | ICD-10-CM

## 2019-05-08 DIAGNOSIS — C9000 Multiple myeloma not having achieved remission: Secondary | ICD-10-CM

## 2019-05-08 DIAGNOSIS — I712 Thoracic aortic aneurysm, without rupture: Secondary | ICD-10-CM | POA: Diagnosis not present

## 2019-05-08 DIAGNOSIS — I350 Nonrheumatic aortic (valve) stenosis: Secondary | ICD-10-CM

## 2019-05-08 HISTORY — PX: TEE WITHOUT CARDIOVERSION: SHX5443

## 2019-05-08 HISTORY — PX: TRANSCATHETER AORTIC VALVE REPLACEMENT, TRANSFEMORAL: SHX6400

## 2019-05-08 LAB — POCT I-STAT 4, (NA,K, GLUC, HGB,HCT)
Glucose, Bld: 103 mg/dL — ABNORMAL HIGH (ref 70–99)
Glucose, Bld: 92 mg/dL (ref 70–99)
HCT: 36 % — ABNORMAL LOW (ref 39.0–52.0)
HCT: 39 % (ref 39.0–52.0)
Hemoglobin: 12.2 g/dL — ABNORMAL LOW (ref 13.0–17.0)
Hemoglobin: 13.3 g/dL (ref 13.0–17.0)
Potassium: 3 mmol/L — ABNORMAL LOW (ref 3.5–5.1)
Potassium: 3.2 mmol/L — ABNORMAL LOW (ref 3.5–5.1)
Sodium: 141 mmol/L (ref 135–145)
Sodium: 141 mmol/L (ref 135–145)

## 2019-05-08 LAB — POCT I-STAT, CHEM 8
BUN: 13 mg/dL (ref 8–23)
Calcium, Ion: 1.19 mmol/L (ref 1.15–1.40)
Chloride: 101 mmol/L (ref 98–111)
Creatinine, Ser: 0.7 mg/dL (ref 0.61–1.24)
Glucose, Bld: 108 mg/dL — ABNORMAL HIGH (ref 70–99)
HCT: 35 % — ABNORMAL LOW (ref 39.0–52.0)
Hemoglobin: 11.9 g/dL — ABNORMAL LOW (ref 13.0–17.0)
Potassium: 3.1 mmol/L — ABNORMAL LOW (ref 3.5–5.1)
Sodium: 141 mmol/L (ref 135–145)
TCO2: 26 mmol/L (ref 22–32)

## 2019-05-08 LAB — POCT ACTIVATED CLOTTING TIME
Activated Clotting Time: 132 seconds
Activated Clotting Time: 286 seconds
Activated Clotting Time: 115 seconds
Activated Clotting Time: 249 seconds

## 2019-05-08 SURGERY — IMPLANTATION, AORTIC VALVE, TRANSCATHETER, FEMORAL APPROACH
Anesthesia: Monitor Anesthesia Care

## 2019-05-08 MED ORDER — NITROGLYCERIN IN D5W 200-5 MCG/ML-% IV SOLN: 0.0000 ug/min | INTRAVENOUS | Status: DC

## 2019-05-08 MED ORDER — HEPARIN SODIUM (PORCINE) 1000 UNIT/ML IJ SOLN
INTRAMUSCULAR | Status: DC | PRN
  Administered 2019-05-08: 13000 [IU] via INTRAVENOUS

## 2019-05-08 MED ORDER — POTASSIUM CHLORIDE CRYS ER 20 MEQ PO TBCR
80.0000 meq | EXTENDED_RELEASE_TABLET | Freq: Once | ORAL | Status: AC
  Administered 2019-05-08: 40 meq via ORAL
  Filled 2019-05-08: qty 4

## 2019-05-08 MED ORDER — PREDNISONE 5 MG PO TABS
15.0000 mg | ORAL_TABLET | Freq: Every day | ORAL | Status: DC
  Administered 2019-05-09 – 2019-05-11 (×3): 15 mg via ORAL
  Filled 2019-05-08 (×3): qty 1

## 2019-05-08 MED ORDER — EPHEDRINE SULFATE-NACL 50-0.9 MG/10ML-% IV SOSY
PREFILLED_SYRINGE | INTRAVENOUS | Status: DC | PRN
  Administered 2019-05-08 (×2): 10 mg via INTRAVENOUS
  Administered 2019-05-08 (×2): 5 mg via INTRAVENOUS

## 2019-05-08 MED ORDER — PROPOFOL 500 MG/50ML IV EMUL
INTRAVENOUS | Status: DC | PRN
  Administered 2019-05-08: 5 ug/kg/min via INTRAVENOUS

## 2019-05-08 MED ORDER — HEPARIN (PORCINE) IN NACL 1000-0.9 UT/500ML-% IV SOLN
INTRAVENOUS | Status: AC
  Filled 2019-05-08: qty 1000

## 2019-05-08 MED ORDER — CHLORHEXIDINE GLUCONATE 4 % EX LIQD: 30.0000 mL | CUTANEOUS | Status: DC

## 2019-05-08 MED ORDER — SODIUM CHLORIDE 0.9% FLUSH
3.0000 mL | Freq: Two times a day (BID) | INTRAVENOUS | Status: DC
  Administered 2019-05-08 – 2019-05-11 (×6): 3 mL via INTRAVENOUS

## 2019-05-08 MED ORDER — CHLORHEXIDINE GLUCONATE 0.12 % MT SOLN
OROMUCOSAL | Status: AC
  Administered 2019-05-08: 15 mL via OROMUCOSAL
  Filled 2019-05-08: qty 15

## 2019-05-08 MED ORDER — PHENYLEPHRINE HCL-NACL 20-0.9 MG/250ML-% IV SOLN
0.0000 ug/min | INTRAVENOUS | Status: DC
  Filled 2019-05-08: qty 250

## 2019-05-08 MED ORDER — HEPARIN (PORCINE) IN NACL 1000-0.9 UT/500ML-% IV SOLN
INTRAVENOUS | Status: DC | PRN
  Administered 2019-05-08 (×3): 500 mL

## 2019-05-08 MED ORDER — FUROSEMIDE 10 MG/ML IJ SOLN
40.0000 mg | Freq: Once | INTRAMUSCULAR | Status: AC
  Administered 2019-05-08: 40 mg via INTRAVENOUS
  Filled 2019-05-08: qty 4

## 2019-05-08 MED ORDER — PROTAMINE SULFATE 10 MG/ML IV SOLN
INTRAVENOUS | Status: DC | PRN
  Administered 2019-05-08: 10 mg via INTRAVENOUS
  Administered 2019-05-08: 110 mg via INTRAVENOUS

## 2019-05-08 MED ORDER — ACETAMINOPHEN 325 MG PO TABS
650.0000 mg | ORAL_TABLET | Freq: Four times a day (QID) | ORAL | Status: DC | PRN
  Administered 2019-05-08 – 2019-05-10 (×3): 650 mg via ORAL
  Filled 2019-05-08 (×3): qty 2

## 2019-05-08 MED ORDER — SODIUM CHLORIDE 0.9 % IV SOLN
1.5000 g | Freq: Two times a day (BID) | INTRAVENOUS | Status: AC
  Administered 2019-05-08 – 2019-05-10 (×4): 1.5 g via INTRAVENOUS
  Filled 2019-05-08 (×4): qty 1.5

## 2019-05-08 MED ORDER — HYDROCODONE-ACETAMINOPHEN 5-325 MG PO TABS: 1.0000 | ORAL_TABLET | Freq: Four times a day (QID) | ORAL | Status: DC | PRN

## 2019-05-08 MED ORDER — LACTATED RINGERS IV SOLN
INTRAVENOUS | Status: DC | PRN
  Administered 2019-05-08: 08:00:00 via INTRAVENOUS

## 2019-05-08 MED ORDER — ONDANSETRON HCL 4 MG/2ML IJ SOLN
INTRAMUSCULAR | Status: DC | PRN
  Administered 2019-05-08: 4 mg via INTRAVENOUS

## 2019-05-08 MED ORDER — MIDAZOLAM HCL 2 MG/2ML IJ SOLN
INTRAMUSCULAR | Status: DC | PRN
  Administered 2019-05-08: 1 mg via INTRAVENOUS

## 2019-05-08 MED ORDER — CHLORHEXIDINE GLUCONATE 4 % EX LIQD: 60.0000 mL | Freq: Once | CUTANEOUS | Status: DC

## 2019-05-08 MED ORDER — LORATADINE 10 MG PO TABS
10.0000 mg | ORAL_TABLET | Freq: Every day | ORAL | Status: DC
  Administered 2019-05-09 – 2019-05-11 (×3): 10 mg via ORAL
  Filled 2019-05-08 (×4): qty 1

## 2019-05-08 MED ORDER — LIDOCAINE HCL 1 % IJ SOLN
INTRAMUSCULAR | Status: AC
  Filled 2019-05-08: qty 40

## 2019-05-08 MED ORDER — LIDOCAINE HCL (PF) 1 % IJ SOLN
INTRAMUSCULAR | Status: DC | PRN
  Administered 2019-05-08: 30 mL

## 2019-05-08 MED ORDER — LORAZEPAM 2 MG/ML IJ SOLN
0.5000 mg | Freq: Once | INTRAMUSCULAR | Status: AC
  Administered 2019-05-08: 0.5 mg via INTRAVENOUS
  Filled 2019-05-08: qty 1

## 2019-05-08 MED ORDER — SODIUM CHLORIDE 0.9% FLUSH: 3.0000 mL | INTRAVENOUS | Status: DC | PRN

## 2019-05-08 MED ORDER — ACETAMINOPHEN 500 MG PO TABS
1000.0000 mg | ORAL_TABLET | Freq: Once | ORAL | Status: AC
  Administered 2019-05-08: 1000 mg via ORAL
  Filled 2019-05-08: qty 2

## 2019-05-08 MED ORDER — OXYCODONE HCL 5 MG PO TABS: 5.0000 mg | ORAL_TABLET | ORAL | Status: DC | PRN

## 2019-05-08 MED ORDER — ASPIRIN 81 MG PO CHEW
81.0000 mg | CHEWABLE_TABLET | Freq: Every day | ORAL | Status: DC
  Administered 2019-05-09 – 2019-05-11 (×3): 81 mg via ORAL
  Filled 2019-05-08 (×3): qty 1

## 2019-05-08 MED ORDER — IOHEXOL 350 MG/ML SOLN
INTRAVENOUS | Status: DC | PRN
  Administered 2019-05-08: 85 mL via INTRA_ARTERIAL

## 2019-05-08 MED ORDER — LACTATED RINGERS IV SOLN
INTRAVENOUS | Status: DC
  Administered 2019-05-08: 08:00:00 via INTRAVENOUS

## 2019-05-08 MED ORDER — ONDANSETRON HCL 4 MG/2ML IJ SOLN: 4.0000 mg | Freq: Four times a day (QID) | INTRAMUSCULAR | Status: DC | PRN

## 2019-05-08 MED ORDER — SODIUM CHLORIDE 0.9 % IV SOLN
INTRAVENOUS | Status: AC
  Administered 2019-05-08: 50 mL via INTRAVENOUS

## 2019-05-08 MED ORDER — FAMOTIDINE 20 MG PO TABS
20.0000 mg | ORAL_TABLET | Freq: Every day | ORAL | Status: DC
  Administered 2019-05-08 – 2019-05-10 (×3): 20 mg via ORAL
  Filled 2019-05-08 (×3): qty 1

## 2019-05-08 MED ORDER — GLYCOPYRROLATE PF 0.2 MG/ML IJ SOSY
PREFILLED_SYRINGE | INTRAMUSCULAR | Status: DC | PRN
  Administered 2019-05-08: .2 mg via INTRAVENOUS

## 2019-05-08 MED ORDER — POTASSIUM CHLORIDE 10 MEQ/100ML IV SOLN
10.0000 meq | INTRAVENOUS | Status: AC
  Administered 2019-05-08 – 2019-05-09 (×4): 10 meq via INTRAVENOUS
  Filled 2019-05-08 (×3): qty 100

## 2019-05-08 MED ORDER — CHLORHEXIDINE GLUCONATE 0.12 % MT SOLN
15.0000 mL | Freq: Once | OROMUCOSAL | Status: AC
  Administered 2019-05-08: 08:00:00 15 mL via OROMUCOSAL

## 2019-05-08 MED ORDER — FENTANYL CITRATE (PF) 100 MCG/2ML IJ SOLN
INTRAMUSCULAR | Status: DC | PRN
  Administered 2019-05-08 (×2): 50 ug via INTRAVENOUS

## 2019-05-08 MED ORDER — SODIUM CHLORIDE 0.9 % IV SOLN
250.0000 mL | INTRAVENOUS | Status: DC | PRN
  Administered 2019-05-09: via INTRAVENOUS

## 2019-05-08 MED ORDER — VANCOMYCIN HCL IN DEXTROSE 1-5 GM/200ML-% IV SOLN
1000.0000 mg | Freq: Once | INTRAVENOUS | Status: AC
  Administered 2019-05-08: 1000 mg via INTRAVENOUS
  Filled 2019-05-08: qty 200

## 2019-05-08 MED ORDER — TRAMADOL HCL 50 MG PO TABS: 50.0000 mg | ORAL_TABLET | ORAL | Status: DC | PRN

## 2019-05-08 MED ORDER — DEXMEDETOMIDINE HCL IN NACL 200 MCG/50ML IV SOLN
INTRAVENOUS | Status: DC | PRN
  Administered 2019-05-08: 45.36 ug via INTRAVENOUS

## 2019-05-08 MED ORDER — PROPOFOL 10 MG/ML IV BOLUS
INTRAVENOUS | Status: DC | PRN
  Administered 2019-05-08: 10 mg via INTRAVENOUS
  Administered 2019-05-08: 15 mg via INTRAVENOUS
  Administered 2019-05-08 (×4): 30 mg via INTRAVENOUS
  Administered 2019-05-08: 15 mg via INTRAVENOUS

## 2019-05-08 MED ORDER — MORPHINE SULFATE (PF) 10 MG/ML IV SOLN: 1.0000 mg | INTRAVENOUS | Status: DC | PRN

## 2019-05-08 MED ORDER — MORPHINE SULFATE (PF) 2 MG/ML IV SOLN: 1.0000 mg | INTRAVENOUS | Status: DC | PRN

## 2019-05-08 MED ORDER — ACETAMINOPHEN 650 MG RE SUPP
650.0000 mg | Freq: Four times a day (QID) | RECTAL | Status: DC | PRN
  Administered 2019-05-09: 650 mg via RECTAL
  Filled 2019-05-08: qty 1

## 2019-05-08 SURGICAL SUPPLY — 34 items
BAG SNAP BAND KOVER 36X36 (MISCELLANEOUS) ×2 IMPLANT
BLANKET WARM UNDERBOD FULL ACC (MISCELLANEOUS) ×2 IMPLANT
CABLE ADAPT CONN TEMP 6FT (ADAPTER) ×1 IMPLANT
CABLE ADAPT PACING TEMP 12FT (ADAPTER) ×1 IMPLANT
CATH 26 ULTRA DELIVERY (CATHETERS) ×2 IMPLANT
CATH DIAG 6FR PIGTAIL ANGLED (CATHETERS) ×2 IMPLANT
CATH INFINITI 6F AL2 (CATHETERS) ×1 IMPLANT
CATH S G BIP PACING (CATHETERS) ×1 IMPLANT
CLOSURE MYNX CONTROL 6F/7F (Vascular Products) ×1 IMPLANT
CRIMPER (MISCELLANEOUS) ×1 IMPLANT
DEVICE CLOSURE PERCLS PRGLD 6F (VASCULAR PRODUCTS) IMPLANT
DEVICE INFLATION ATRION QL2530 (MISCELLANEOUS) ×1 IMPLANT
ELECT DEFIB PAD ADLT CADENCE (PAD) ×1 IMPLANT
GUIDEWIRE SAFE TJ AMPLATZ EXST (WIRE) ×1 IMPLANT
KIT HEART LEFT (KITS) ×2 IMPLANT
KIT MICROPUNCTURE NIT STIFF (SHEATH) ×1 IMPLANT
PACK CARDIAC CATHETERIZATION (CUSTOM PROCEDURE TRAY) ×2 IMPLANT
PERCLOSE PROGLIDE 6F (VASCULAR PRODUCTS) ×4
SHEATH 14X36 EDWARDS (SHEATH) ×1 IMPLANT
SHEATH BRITE TIP 7FR 35CM (SHEATH) ×1 IMPLANT
SHEATH PINNACLE 6F 10CM (SHEATH) ×1 IMPLANT
SHEATH PINNACLE 8F 10CM (SHEATH) ×1 IMPLANT
SHEATH PROBE COVER 6X72 (BAG) ×1 IMPLANT
SLEEVE REPOSITIONING LENGTH 30 (MISCELLANEOUS) ×1 IMPLANT
STOPCOCK MORSE 400PSI 3WAY (MISCELLANEOUS) ×4 IMPLANT
TRANSDUCER W/STOPCOCK (MISCELLANEOUS) ×4 IMPLANT
TUBE CONN 8.8X1320 FR HP M-F (CONNECTOR) ×1 IMPLANT
TUBING ART PRESS 72  MALE/FEM (TUBING) ×1
TUBING ART PRESS 72 MALE/FEM (TUBING) IMPLANT
VALVE 26 ULTRA SAPIEN KIT (Valve) ×2 IMPLANT
WIRE AMPLATZ SS-J .035X180CM (WIRE) ×1 IMPLANT
WIRE EMERALD 3MM-J .035X150CM (WIRE) ×1 IMPLANT
WIRE EMERALD 3MM-J .035X260CM (WIRE) ×1 IMPLANT
WIRE EMERALD ST .035X260CM (WIRE) ×1 IMPLANT

## 2019-05-08 NOTE — CV Procedure (Signed)
[facility]  TAVR OPERATIVE NOTE   Date of Procedure:  05/08/2019  Preoperative Diagnosis: Severe Aortic Stenosis   Postoperative Diagnosis: Same   Procedure:    Transcatheter Aortic Valve Replacement - Transfemoral Approach  Edwards Sapien 3 THV (size 26 mm, model # [id number], serial # [id number])             Edwards Sapien 3 THV (size 26 mm, model # [id number], serial # [id number])   Co-Surgeons:  Lauree Chandler, MD and Gaye Pollack, MD  Anesthesiologist:  Roanna Banning  Echocardiographer:  Meda Coffee  Pre-operative Echo Findings:  Severe aortic stenosis  Normal left ventricular systolic function  Post-operative Echo Findings:  No paravalvular leak  Normal left ventricular systolic function  BRIEF CLINICAL NOTE AND INDICATIONS FOR SURGERY  78 yo male with history of HTN, paroxysmal atrial fibrillation, anemia, ascending aortic aneurysm, hyperlipidemia and severe aortic stenosis. He is followed in our practice by Dr. Aundra Dubin and has been known to have aortic stenosis for several years. He has been see by Dr. Rayann Heman for management of his atrial fibrillation and has had multiple cardioversions in the past. He has been on Eliquis. Echo February 2020 with normal LV size and function, LVEF=60-65%. The aortic valve is thickened and calcified (mean gradient of 48 mmHg, peak gradient 65.9 mmHg, AVA 0.83 cm2) consistent with severe AS. No mitral valve abnormality. Coronary CTA February 2020 with mild CAD. His ascending aortic aneurysm was 4.3 cm on recent cardiac CTA.Cardiac cath with mild CAD. He was found to have multiple myeloma and is planning treatment following TAVR.   During the course of the patient's preoperative work up they have been evaluated comprehensively by a multidisciplinary team of specialists coordinated through the [facility].  They have been demonstrated to suffer from symptomatic severe  aortic stenosis as noted above. The patient has been counseled extensively as to the relative risks and benefits of all options for the treatment of severe aortic stenosis including long term medical therapy, conventional surgery for aortic valve replacement, and transcatheter aortic valve replacement.  The patient has been independently evaluated by Dr. Cyndia Bent with CT surgery and they are felt to be at high risk for conventional surgical aortic valve replacement. The surgeon indicated the patient would be a poor candidate for conventional surgery. Based upon review of all of the patient's preoperative diagnostic tests they are felt to be candidate for transcatheter aortic valve replacement using the transfemoral approach as an alternative to high risk conventional surgery.    Following the decision to proceed with transcatheter aortic valve replacement, a discussion has been held regarding what types of management strategies would be attempted intraoperatively in the event of life-threatening complications, including whether or not the patient would be considered a candidate for the use of cardiopulmonary bypass and/or conversion to open sternotomy for attempted surgical intervention.  The patient has been advised of a variety of complications that might develop peculiar to this approach including but not limited to risks of death, stroke, paravalvular leak, aortic dissection or other major vascular complications, aortic annulus rupture, device embolization, cardiac rupture or perforation, acute myocardial infarction, arrhythmia, heart block or bradycardia requiring permanent pacemaker placement, congestive heart failure, respiratory failure, renal failure, pneumonia, infection, other late complications related to structural valve deterioration or migration, or other complications that might ultimately cause a temporary or permanent loss of functional independence or other long term morbidity.  The patient  provides  full informed consent for the procedure as described and all questions were answered preoperatively.    DETAILS OF THE OPERATIVE PROCEDURE  PREPARATION:   The patient is brought to the operating room on the above mentioned date and central monitoring was established by the anesthesia team including placement of a radial arterial line. The patient is placed in the supine position on the operating table.  Intravenous antibiotics are administered. Conscious sedation is used.   Baseline transthoracic echocardiogram was performed. The patient's chest, abdomen, both groins, and both lower extremities are prepared and draped in a sterile manner. A time out procedure is performed.   PERIPHERAL ACCESS:   Using the modified Seldinger technique, femoral arterial and venous access were obtained with placement of 6 Fr sheaths on the left side using u/s guidance.  A pigtail diagnostic catheter was passed through the femoral arterial sheath under fluoroscopic guidance into the aortic root.  A temporary transvenous pacemaker catheter was passed through the femoral venous sheath under fluoroscopic guidance into the right ventricle.  The pacemaker was tested to ensure stable lead placement and pacemaker capture. Aortic root angiography was performed in order to determine the optimal angiographic angle for valve deployment.  TRANSFEMORAL ACCESS:  A micropuncture kit was used to gain access to the right femoral artery using u/s guidance. Position confirmed with angiography. Pre-closure with double ProGlide closure devices. The patient was heparinized systemically and ACT verified > 250 seconds.    A 14  Fr transfemoral E-sheath was introduced into the right femoral artery after progressively dilating over an Amplatz superstiff wire. An AL-2 catheter was used to direct a straight-tip exchange length wire across the native aortic valve into the left ventricle. This was exchanged out for a pigtail catheter and  position was confirmed in the LV apex. Simultaneous LV and Ao pressures were recorded.  The pigtail catheter was then exchanged for an Amplatz Extra-stiff wire in the LV apex.   TRANSCATHETER HEART VALVE DEPLOYMENT:  An Edwards Sapien 3 THV (size 26 mm) was prepared and crimped per manufacturer's guidelines, and the proper orientation of the valve is confirmed on the [employer] delivery system. The valve was advanced through the introducer sheath using normal technique until in an appropriate position in the abdominal aorta beyond the sheath tip. The balloon was then retracted and using the fine-tuning wheel was centered on the valve. The valve was then advanced across the aortic arch using appropriate flexion of the catheter. The valve was carefully positioned across the aortic valve annulus. The Commander catheter was retracted using normal technique. Once final position of the valve has been confirmed by angiographic assessment, the valve is deployed while temporarily holding ventilation and during rapid ventricular pacing to maintain systolic blood pressure < 50 mmHg and pulse pressure < 10 mmHg. The balloon inflation is held for >3 seconds after reaching full deployment volume. Once the balloon has fully deflated the balloon is retracted into the ascending aorta and valve function is assessed using TTE. There was moderate AI felt to be due to high position of the valve, affected by the movement of the deployment catheter secondary to the aortic aneurysm. The patient's hemodynamic recovery following valve deployment is good.  We elected to place a second valve. The first deployment balloon and guidewire are both removed. We then crossed the new valve with a pigtail catheter and placed the Amplatz Extra-stiff wire.  The second valve was advanced through the introducer sheath using normal technique until in an appropriate  position in the abdominal aorta beyond the sheath tip. The balloon was then  retracted and using the fine-tuning wheel was centered on the valve. The valve was then advanced across the aortic arch using appropriate flexion of the catheter. The valve was carefully positioned across the aortic valve annulus. The Commander catheter was retracted using normal technique. The valve was deployed within the first valve. Echo demostrated acceptable post-procedural gradients, stable mitral valve function, and no AI.   PROCEDURE COMPLETION:  The sheath was then removed and closure devices were completed. Protamine was administered once femoral arterial repair was complete. The temporary pacemaker, pigtail catheters and femoral sheaths were removed with Mynx closure device placed in the artery and manual pressure for the vein.   The patient tolerated the procedure well and is transported to the surgical intensive care in stable condition. There were no immediate intraoperative complications. All sponge instrument and needle counts are verified correct at completion of the operation.   No blood products were administered during the operation.  The patient received a total of 85 mL of intravenous contrast during the procedure.  Lauree Chandler MD 05/08/2019 1:49 PM

## 2019-05-08 NOTE — Transfer of Care (Signed)
Immediate Anesthesia Transfer of Care Note  Patient: [name] [name]  Procedure(s) Performed: TRANSCATHETER AORTIC VALVE REPLACEMENT, TRANSFEMORAL (N/A ) TRANSESOPHAGEAL ECHOCARDIOGRAM (TEE) (N/A )  Patient Location: PACU and Cath Lab  Anesthesia Type:MAC  Level of Consciousness: awake, alert  and patient cooperative  Airway & Oxygen Therapy: Patient Spontanous Breathing  Post-op Assessment: Report given to RN and Post -op Vital signs reviewed and stable  Post vital signs: Reviewed and stable  Last Vitals:  Vitals Value Taken Time  BP    Temp    Pulse 57 05/08/19 1145  Resp 18 05/08/19 1145  SpO2 97 % 05/08/19 1145  Vitals shown include unvalidated device data.  Last Pain:  Vitals:   05/08/19 0803  PainSc: 5       Patients Stated Pain Goal: 2 (47/65/46 5035)  Complications: No apparent anesthesia complications

## 2019-05-08 NOTE — Anesthesia Postprocedure Evaluation (Signed)
Anesthesia Post Note  Patient: [name] [name]  Procedure(s) Performed: TRANSCATHETER AORTIC VALVE REPLACEMENT, TRANSFEMORAL (N/A ) TRANSESOPHAGEAL ECHOCARDIOGRAM (TEE) (N/A )     Patient location during evaluation: Cath Lab Anesthesia Type: MAC Level of consciousness: awake Pain management: pain level controlled Vital Signs Assessment: post-procedure vital signs reviewed and stable Respiratory status: spontaneous breathing, nonlabored ventilation, respiratory function stable and patient connected to nasal cannula oxygen Cardiovascular status: stable and blood pressure returned to baseline Postop Assessment: no apparent nausea or vomiting Anesthetic complications: no    Last Vitals:  Vitals:   05/08/19 1600 05/08/19 1722  BP: (!) 101/55 (!) 110/59  Pulse:    Resp: 16 10  Temp: (!) 36.4 C 36.4 C  SpO2: 96% 100%    Last Pain:  Vitals:   05/08/19 1722  TempSrc: Oral  PainSc:                  Andora Krull P Lleyton Byers

## 2019-05-08 NOTE — Progress Notes (Signed)
Pt ambulated in hallway 267ft. Tolerated well. Bilateral groin level 0. Returned to bed. Call light in reach.  Clyde Canterbury, RN

## 2019-05-08 NOTE — Progress Notes (Signed)
  [place] VALVE TEAM  Patient doing well s/p TAVR. He is hemodynamically stable. Groin sites stable. ECG with sinus with LVH but no high grade block. Arterial line discontinued and transferred  to 4E. Plan for early ambulation after bedrest completed and hopeful discharge over the next 24-48 hours.   Angelena Form PA-C  [facility]  Pager [phone]

## 2019-05-08 NOTE — Progress Notes (Addendum)
Called in to patient room, NT has already been in room trying to calm patient. When I entered the room pt was sweating, RR 25-27/min otherwise vitals stable. Seemed like she was having anxiety attack. Pt sated he almost died last two times he was here in [place]. o2 sat was 98% in RA , 2l o2 via [place] applied. I asked him if he have had anxiety attack before he said yes. I stayed with pt for 30 minutes trying to calm him down. He was worried about his HR being in 80's instead of 50's. I explained it to him its fine. I tried to reassure him, talked with him trying to calm him down. He did walk to the bathroom and back to bed. Shaky, shivering. Temperature 97.8 . I asked him if he wants any meds to relax , he denied. Lung sounds clear, heart sound s1s2 audible.  I left the door open. Call bell within reach. Will continue to monitor.

## 2019-05-08 NOTE — Progress Notes (Signed)
2302:Text paged on call cardiology DR. Akhter.  2303: MD called back, notified pt just took 40 of potassium out of 80 meq. And also notified pt has been very anxious and shaky. Turning and tossing and I stayed with him for 30 min earlier trying to calm him down. Received order to switch 40 meq of potassium to IV and 0.5 mg ativan. Will continue to monitor.

## 2019-05-08 NOTE — Progress Notes (Signed)
Text paged on call Cardiologist DR. Akhter and notified pt had temp of 102.8 and I had given him tylenol. Will continue to monitor.

## 2019-05-08 NOTE — Discharge Instructions (Signed)
ACTIVITY AND EXERCISE °• Daily activity and exercise are an important part of your recovery. People recover at different rates depending on their general health and type of valve procedure. °• Most people recovering from TAVR feel better relatively quickly  °• No lifting, pushing, pulling more than 10 pounds (examples to avoid: groceries, vacuuming, gardening, golfing): °            - For one week with a procedure through the groin. °            - For six weeks for procedures through the chest wall or neck. °NOTE: You will typically see one of our providers 7-14 days after your procedure to discuss WHEN TO RESUME the above activities.  °  °  °DRIVING °• Do not drive until you are seen for follow up and cleared by a provider. Generally, we ask patient to not drive for 1 week after their procedure. °• If you have been told by your doctor in the past that you may not drive, you must talk with him/her before you begin driving again. °  °DRESSING °• Groin site: you may leave the clear dressing over the site for up to one week or until it falls off. °  °HYGIENE °• If you had a femoral (leg) procedure, you may take a shower when you return home. After the shower, pat the site dry. Do NOT use powder, oils or lotions in your groin area until the site has completely healed. °• If you had a chest procedure, you may shower when you return home unless specifically instructed not to by your discharging practitioner. °            - DO NOT scrub incision; pat dry with a towel. °            - DO NOT apply any lotions, oils, powders to the incision. °            - No tub baths / swimming for at least 2 weeks. °• If you notice any fevers, chills, increased pain, swelling, bleeding or pus, please contact your doctor. °  °ADDITIONAL INFORMATION °• If you are going to have an upcoming dental procedure, please contact our office as you will require antibiotics ahead of time to prevent infection on your heart valve.  ° ° °If you have any  questions or concerns you can call the structural heart phone during normal business hours 8am-4pm. If you have an urgent need after hours or weekends please call [phone] to talk to the on call provider for general cardiology. If you have an emergency that requires immediate attention, please call 911.  ° ° °After TAVR Checklist ° °Check  Test Description  ° Follow up appointment in 1-2 weeks  You will see our structural heart physician assistant, Katie Sparkle Aube. Your incision sites will be checked and you will be cleared to drive and resume all normal activities if you are doing well.    ° 1 month echo and follow up  You will have an echo to check on your new heart valve and be seen back in the office by Katie Knowledge Escandon. Many times the echo is not read by your appointment time, but Katie will call you later that day or the following day to report your results.  ° Follow up with your primary cardiologist You will need to be seen by your primary cardiologist in the following 3-6 months after your 1 month appointment in the valve   clinic. Often times your Plavix or Aspirin will be discontinued during this time, but this is decided on a case by case basis.   ° 1 year echo and follow up You will have another echo to check on your heart valve after 1 year and be seen back in the office by Katie Nyella Eckels. This your last structural heart visit.  ° Bacterial endocarditis prophylaxis  You will have to take antibiotics for the rest of your life before all dental procedures (even teeth cleanings) to protect your heart valve. Antibiotics are also required before some surgeries. Please check with your cardiologist before scheduling any surgeries. Also, please make sure to tell us if you have a penicillin allergy as you will require an alternative antibiotic.   ° ° °

## 2019-05-08 NOTE — Anesthesia Procedure Notes (Signed)
Procedure Name: MAC Date/Time: 05/08/2019 9:35 AM Performed by: Elayne Snare, CRNA Pre-anesthesia Checklist: Patient identified, Emergency Drugs available, Suction available and Patient being monitored Patient Re-evaluated:Patient Re-evaluated prior to induction Oxygen Delivery Method: Simple face mask

## 2019-05-08 NOTE — Interval H&P Note (Signed)
History and Physical Interval Note:  05/08/2019 9:24 AM  [name] [name]  has presented today for surgery, with the diagnosis of Severe Aortic Stenosis.  The various methods of treatment have been discussed with the patient and family. After consideration of risks, benefits and other options for treatment, the patient has consented to  Procedure(s): TRANSCATHETER AORTIC VALVE REPLACEMENT, TRANSFEMORAL (N/A) TRANSESOPHAGEAL ECHOCARDIOGRAM (TEE) (N/A) as a surgical intervention.  The patient's history has been reviewed, patient examined, no change in status, stable for surgery.  I have reviewed the patient's chart and labs.  Questions were answered to the patient's satisfaction.     Gaye Pollack

## 2019-05-08 NOTE — Op Note (Signed)
[facility]   MULTIDISCIPLINARY HEART VALVE TEAM   TAVR OPERATIVE NOTE   Date of Procedure:  05/08/2019  Preoperative Diagnosis: Severe Aortic Stenosis   Postoperative Diagnosis: Same   Procedure:    Transcatheter Aortic Valve Replacement - Percutaneous Right Transfemoral Approach  Edwards Sapien 3 Ultra THV (size 26 mm, model # 9750TFX, serial # [id number])  Edwards Sapien 3 Ultra THV (size 26 mm, model # 9750TFX, serial # [id number])  Co-Surgeons:  Gaye Pollack, MD and Lauree Chandler, MD   Anesthesiologist:  Perfecto Kingdom, MD  Echocardiographer:  Ena Dawley, MD  Pre-operative Echo Findings:  Severe aortic stenosis  Normal left ventricular systolic function  Post-operative Echo Findings:  No paravalvular leak  Normal left ventricular systolic function   BRIEF CLINICAL NOTE AND INDICATIONS FOR SURGERY  This 78 year old gentleman has stage D, severe, symptomatic aortic stenosis with New York Heart Association class II symptoms of progressive exertional fatigue and shortness of breath consistent with chronic diastolic congestive heart failure. I have personally reviewed his 2D echocardiogram, cardiac catheterization, and CTA studies. His echocardiogram shows a bicuspid aortic valve with calcified leaflets and restricted mobility. The mean gradient across aortic valve is 48 mmHg consistent with severe aortic stenosis. Left ventricular systolic function is normal. Cardiac catheterization shows no coronary disease. The mean gradient across the aortic valve was 49 mmHg consistent with severe aortic stenosis. I agree that aortic valve replacement is indicated in this active patient to prevent progressive functional decline and left ventricular deterioration. Given his age I think transcatheter aortic valve replacement would be the best option for him. His gated cardiac CTA shows anatomy suitable for transcatheter aortic valve replacement although his left main  minimum height was only measured at 8.6 mm. We will need to review this measurement. His abdominal and pelvic CTA shows adequate pelvic vascular anatomy to allow transfemoral insertion.  The patient was counseled at length regarding treatment alternatives for management of severe symptomatic aortic stenosis. The risks and benefits of surgical intervention has been discussed in detail. Long-term prognosis with medical therapy was discussed. Alternative approaches such as conventional surgical aortic valve replacement, transcatheter aortic valve replacement, and palliative medical therapy were compared and contrasted at length. This discussion was placed in the context of the patient's own specific clinical presentation and past medical history. All of his questions have been addressed.  Following the decision to proceed with transcatheter aortic valve replacement, a discussion was held regarding what types of management strategies would be attempted intraoperatively in the event of life-threatening complications, including whether or not the patient would be considered a candidate for the use of cardiopulmonary bypass and/or conversion to open sternotomy for attempted surgical intervention. He is in overall good condition for his age and has low surgical risk for open surgical aortic valve replacement so I think he would be a candidate for emergent sternotomy to manage any intraoperative complications.  The patient has been advised of a variety of complications that might develop including but not limited to risks of death, stroke, paravalvular leak, aortic dissection or other major vascular complications, aortic annulus rupture, device embolization, cardiac rupture or perforation, mitral regurgitation, acute myocardial infarction, arrhythmia, heart block or bradycardia requiring permanent pacemaker placement, congestive heart failure, respiratory failure, renal failure, pneumonia, infection, other late  complications related to structural valve deterioration or migration, or other complications that might ultimately cause a temporary or permanent loss of functional independence or other long term morbidity. The patient provides full informed consent  for the procedure as described and all questions were answered.    DETAILS OF THE OPERATIVE PROCEDURE  PREPARATION:    The patient is brought to the operating room on the above mentioned date and appropriate monitoring was established by the anesthesia team. The patient is placed in the supine position on the operating table.  Intravenous antibiotics are administered. The patient is monitored closely throughout the procedure under conscious sedation.  Baseline transthoracic echocardiogram was performed. The patient's chest, abdomen, both groins, and both lower extremities are prepared and draped in a sterile manner. A time out procedure is performed.   PERIPHERAL ACCESS:    Using the modified Seldinger technique, femoral arterial and venous access was obtained with placement of 6 Fr sheaths on the left side.  A pigtail diagnostic catheter was passed through the left arterial sheath under fluoroscopic guidance into the aortic root.  A temporary transvenous pacemaker catheter was passed through the left femoral venous sheath under fluoroscopic guidance into the right ventricle.  The pacemaker was tested to ensure stable lead placement and pacemaker capture. Aortic root angiography was performed in order to determine the optimal angiographic angle for valve deployment.   TRANSFEMORAL ACCESS:   Percutaneous transfemoral access and sheath placement was performed using ultrasound guidance.  The right common femoral artery was cannulated using a micropuncture needle and appropriate location was verified using hand injection angiogram.  A pair of Abbott Perclose percutaneous closure devices were placed and a 6 French sheath replaced into the femoral artery.   The patient was heparinized systemically and ACT verified > 250 seconds.    A 14 Fr transfemoral E-sheath was introduced into the right common femoral artery after progressively dilating over an Amplatz superstiff wire. An AL-2 catheter was used to direct a straight-tip exchange length wire across the native aortic valve into the left ventricle. This was exchanged out for a pigtail catheter and position was confirmed in the LV apex. Simultaneous LV and Ao pressures were recorded.  The pigtail catheter was exchanged for an Amplatz Extra-stiff wire in the LV apex.     BALLOON AORTIC VALVULOPLASTY:   Not performed.   TRANSCATHETER HEART VALVE DEPLOYMENT:   An Edwards Sapien 3 Ultra transcatheter heart valve (size 26 mm, model #9750TFX, serial [id number]) was prepared and crimped per manufacturer's guidelines, and the proper orientation of the valve is confirmed on the [employer] delivery system. The valve was advanced through the introducer sheath using normal technique until in an appropriate position in the abdominal aorta beyond the sheath tip. The balloon was then retracted and using the fine-tuning wheel was centered on the valve. The valve was then advanced across the aortic arch using appropriate flexion of the catheter. The valve was carefully positioned across the aortic valve annulus. The Commander catheter was retracted using normal technique. Once final position of the valve has been confirmed by angiographic assessment, the valve is deployed while temporarily holding ventilation and during rapid ventricular pacing to maintain systolic blood pressure < 50 mmHg and pulse pressure < 10 mmHg. The balloon inflation is held for >3 seconds after reaching full deployment volume. Once the balloon has fully deflated the balloon is retracted into the ascending aorta and valve function is assessed using echocardiography. There is felt to be moderate paravalvular leak and no central aortic  insufficiency.  This was felt to be due to a high position of the valve beneath the left main coronary as the valve tilted during deployment. The patient's sedation was  deepened and a TEE was performed by Dr. Meda Coffee confirming moderate paravalvular AI along the left coronary cusp. We therefore decided to place a second valve. The patient's hemodynamic recovery following valve deployment is good.  The deployment balloon and guidewire are both removed. We then crossed the new valve with a pigtail catheter and placed the Amplatz Extra-stiff wire.  The second valve was advanced across the aortic arch using appropriate flexion of the catheter. The valve was carefully positioned across the aortic valve annulus. The Commander catheter was retracted using normal technique. The valve was deployed within the first valve. Echo demostrated acceptable post-procedural gradients, stable mitral valve function, and no AI.    PROCEDURE COMPLETION:   The sheath was removed and femoral artery closure performed.  Protamine was administered once femoral arterial repair was complete. The temporary pacemaker, pigtail catheters and femoral sheaths were removed with manual pressure used for hemostasis.  A Mynx femoral closure device was utilized following removal of the diagnostic sheath in the left femoral artery.  The patient tolerated the procedure well and is transported to the surgical intensive care in stable condition. There were no immediate intraoperative complications. All sponge instrument and needle counts are verified correct at completion of the operation.   No blood products were administered during the operation.  The patient received a total of 85 mL of intravenous contrast during the procedure.   Gaye Pollack, MD 05/08/2019 3:50 PM

## 2019-05-08 NOTE — Progress Notes (Signed)
  Echocardiogram Echocardiogram Transesophageal has been performed.  [name] [name] 05/08/2019, 11:30 AM

## 2019-05-08 NOTE — Anesthesia Procedure Notes (Addendum)
Arterial Line Insertion Start/End8/25/2020 8:30 AM Performed by: Elayne Snare, CRNA, CRNA  Preanesthetic checklist: patient identified, IV checked, risks and benefits discussed, surgical consent and monitors and equipment checked Lidocaine 1% used for infiltration Left, radial was placed Catheter size: 20 G Hand hygiene performed  and maximum sterile barriers used  Allen's test indicative of satisfactory collateral circulation Attempts: 1 Procedure performed without using ultrasound guided technique. Following insertion, dressing applied and Biopatch. Post procedure assessment: normal  Patient tolerated the procedure well with no immediate complications.

## 2019-05-08 NOTE — Progress Notes (Signed)
Pt received from Cath lab. VSS. Bilateral groins level 0. CHG complete. Telemetry applied. Pt oriented to room and unit. Will Continue to monitor.  Clyde Canterbury, RN

## 2019-05-09 ENCOUNTER — Encounter: Payer: Self-pay | Admitting: Thoracic Surgery (Cardiothoracic Vascular Surgery)

## 2019-05-09 ENCOUNTER — Inpatient Hospital Stay (HOSPITAL_COMMUNITY): Payer: Medicare Other

## 2019-05-09 DIAGNOSIS — Z952 Presence of prosthetic heart valve: Secondary | ICD-10-CM

## 2019-05-09 DIAGNOSIS — Z7901 Long term (current) use of anticoagulants: Secondary | ICD-10-CM

## 2019-05-09 DIAGNOSIS — Q231 Congenital insufficiency of aortic valve: Principal | ICD-10-CM

## 2019-05-09 DIAGNOSIS — Z888 Allergy status to other drugs, medicaments and biological substances status: Secondary | ICD-10-CM

## 2019-05-09 DIAGNOSIS — Z954 Presence of other heart-valve replacement: Secondary | ICD-10-CM

## 2019-05-09 DIAGNOSIS — I350 Nonrheumatic aortic (valve) stenosis: Secondary | ICD-10-CM

## 2019-05-09 DIAGNOSIS — R5082 Postprocedural fever: Secondary | ICD-10-CM

## 2019-05-09 DIAGNOSIS — R011 Cardiac murmur, unspecified: Secondary | ICD-10-CM

## 2019-05-09 DIAGNOSIS — I480 Paroxysmal atrial fibrillation: Secondary | ICD-10-CM

## 2019-05-09 DIAGNOSIS — C9000 Multiple myeloma not having achieved remission: Secondary | ICD-10-CM

## 2019-05-09 DIAGNOSIS — I10 Essential (primary) hypertension: Secondary | ICD-10-CM

## 2019-05-09 LAB — CBC
HCT: 40.6 % (ref 39.0–52.0)
Hemoglobin: 13.3 g/dL (ref 13.0–17.0)
MCH: 31.2 pg (ref 26.0–34.0)
MCHC: 32.8 g/dL (ref 30.0–36.0)
MCV: 95.3 fL (ref 80.0–100.0)
Platelets: 166 10*3/uL (ref 150–400)
RBC: 4.26 MIL/uL (ref 4.22–5.81)
RDW: 13.1 % (ref 11.5–15.5)
WBC: 8.9 10*3/uL (ref 4.0–10.5)
nRBC: 0 % (ref 0.0–0.2)

## 2019-05-09 LAB — BASIC METABOLIC PANEL
Anion gap: 10 (ref 5–15)
BUN: 16 mg/dL (ref 8–23)
CO2: 29 mmol/L (ref 22–32)
Calcium: 8.8 mg/dL — ABNORMAL LOW (ref 8.9–10.3)
Chloride: 100 mmol/L (ref 98–111)
Creatinine, Ser: 1.11 mg/dL (ref 0.61–1.24)
GFR calc Af Amer: >60 mL/min (ref >60)
GFR calc non Af Amer: >60 mL/min (ref >60)
Glucose, Bld: 117 mg/dL — ABNORMAL HIGH (ref 70–99)
Potassium: 3.3 mmol/L — ABNORMAL LOW (ref 3.5–5.1)
Sodium: 139 mmol/L (ref 135–145)

## 2019-05-09 LAB — ECHOCARDIOGRAM COMPLETE
Height: 71 in
Weight: 3118.19 oz

## 2019-05-09 LAB — POTASSIUM: Potassium: 3.4 mmol/L — ABNORMAL LOW (ref 3.5–5.1)

## 2019-05-09 LAB — MAGNESIUM: Magnesium: 1.5 mg/dL — ABNORMAL LOW (ref 1.7–2.4)

## 2019-05-09 MED ORDER — METOPROLOL SUCCINATE ER 50 MG PO TB24
50.0000 mg | ORAL_TABLET | Freq: Two times a day (BID) | ORAL | Status: DC
  Administered 2019-05-09 (×2): 50 mg via ORAL
  Filled 2019-05-09 (×2): qty 1

## 2019-05-09 MED ORDER — PERFLUTREN LIPID MICROSPHERE
1.0000 mL | INTRAVENOUS | Status: AC | PRN
  Administered 2019-05-09: 4 mL via INTRAVENOUS
  Filled 2019-05-09: qty 10

## 2019-05-09 MED ORDER — MAGNESIUM OXIDE 400 (241.3 MG) MG PO TABS
400.0000 mg | ORAL_TABLET | Freq: Two times a day (BID) | ORAL | Status: DC
  Administered 2019-05-09 – 2019-05-11 (×5): 400 mg via ORAL
  Filled 2019-05-09 (×5): qty 1

## 2019-05-09 MED ORDER — POTASSIUM CHLORIDE CRYS ER 20 MEQ PO TBCR: 80.0000 meq | EXTENDED_RELEASE_TABLET | Freq: Once | ORAL | Status: DC

## 2019-05-09 MED ORDER — POTASSIUM CHLORIDE CRYS ER 20 MEQ PO TBCR
40.0000 meq | EXTENDED_RELEASE_TABLET | ORAL | Status: AC
  Administered 2019-05-09 (×2): 40 meq via ORAL
  Filled 2019-05-09 (×2): qty 2

## 2019-05-09 MED FILL — Lidocaine HCl Local Inj 1%: INTRAMUSCULAR | Qty: 40 | Status: AC

## 2019-05-09 NOTE — Plan of Care (Signed)
Poc progressing.  

## 2019-05-09 NOTE — Progress Notes (Signed)
  Echocardiogram 2D Echocardiogram has been performed.  [name] [name] 05/09/2019, 10:22 AM

## 2019-05-09 NOTE — Progress Notes (Addendum)
  [facility]   MULTIDISCIPLINARY HEART VALVE TEAM  Called by RN about a 104 degree fever. Patient complains of general malaise with no localizing symptoms. Will get repeat CXR, UA and blood cultures.  He has been given tylenol.    Angelena Form PA-C  [facility]

## 2019-05-09 NOTE — Progress Notes (Signed)
Noted continued high fever today. Will hold ambulation. Will f/u tomorrow. Yves Dill CES, ACSM 12:39 PM 05/09/2019

## 2019-05-09 NOTE — Progress Notes (Signed)
Change in status.  Temp 103.1 oral & 104.1 rectal.  MD & Rapid notified.  Orders pending

## 2019-05-09 NOTE — Progress Notes (Signed)
Patient ID: [name] [name], male   DOB: [birth year], 78 y.o.   MRN: [id number] TCTS  He spiked fever to 102.8 last night and again to 103.1 at 11 am. He felt poorly all day. Blood cultures sent. CXR clear. He has been hemodynamically stable. No central lines in. Never had foley.  Exam is unremarkable. He is awake and alert but looks tired. Neuro intact. Valve sounds ok with 2/6 systolic murmur. Lungs clear. Groins ok. I reviewed his echo from this am. Mean gradient is higher than expected at 29 mm Hg. It was 5 mm Hg in the OR. He is hyperdynamic which may be increasing the gradient. There is no paravalvular leak.  Will continue close observation.

## 2019-05-09 NOTE — Progress Notes (Signed)
1 Day Post-Op Procedure(s) (LRB): TRANSCATHETER AORTIC VALVE REPLACEMENT, TRANSFEMORAL (N/A) TRANSESOPHAGEAL ECHOCARDIOGRAM (TEE) (N/A) Subjective: Doesn't feel well this am. Complains of back pain, some chest wall pain.  Objective: Vital signs in last 24 hours: Temp:  [97.5 F (36.4 C)-102.8 F (39.3 C)] 98.4 F (36.9 C) (08/26 0837) Pulse Rate:  [41-98] 89 (08/26 0629) Cardiac Rhythm: Normal sinus rhythm (08/26 0700) Resp:  [0-33] 30 (08/26 0837) BP: (94-149)/(40-71) 117/53 (08/26 0837) SpO2:  [92 %-100 %] 98 % (08/26 0629) Weight:  [88.4 kg] 88.4 kg (08/26 0700)  Hemodynamic parameters for last 24 hours:    Intake/Output from previous day: 08/25 0701 - 08/26 0700 In: 1471.1 [P.O.:450; I.V.:340.5; IV Piggyback:680.6] Out: 8329 [Urine:1150; Blood:40] Intake/Output this shift: No intake/output data recorded.  General appearance: alert and cooperative Neurologic: intact Heart: regular rate and rhythm, soft systolic flow murmur, no diastolic murmur. Lungs: clear to auscultation bilaterally Abdomen: soft, non-tender; bowel sounds normal; no masses,  no organomegaly Extremities: extremities normal, atraumatic, no cyanosis or edema Wound: groin sites ok  Lab Results: Recent Labs    05/08/19 1200 05/09/19 0234  WBC  --  8.9  HGB 11.9* 13.3  HCT 35.0* 40.6  PLT  --  166   BMET:  Recent Labs    05/08/19 1200 05/09/19 0234  NA 141 139  K 3.1* 3.3*  CL 101 100  CO2  --  29  GLUCOSE 108* 117*  BUN 13 16  CREATININE 0.70 1.11  CALCIUM  --  8.8*    PT/INR: No results for input(s): LABPROT, INR in the last 72 hours. ABG    Component Value Date/Time   PHART 7.462 (H) 05/04/2019 1545   HCO3 28.3 (H) 05/04/2019 1545   TCO2 26 05/08/2019 1200   O2SAT 97.4 05/04/2019 1545   CBG (last 3)  No results for input(s): GLUCAP in the last 72 hours.  ECG: sinus, no acute changes  Assessment/Plan: S/P Procedure(s) (LRB): TRANSCATHETER AORTIC VALVE REPLACEMENT,  TRANSFEMORAL (N/A) TRANSESOPHAGEAL ECHOCARDIOGRAM (TEE) (N/A)  POD 1 Hemodynamically stable in sinus rhythm. No postop heart block on monitor.  Fever to 102.8 of unclear etiology. Back down to 98.6 this am. Lines are out. Could be related to his multiple myeloma. WBC ct normal.  Hypokalemia and hypomagnesemia: replacing.  2D echo today.  Mobilize and observe. Will not send home today since he does not feel well and had fever.    LOS: 1 day    Gaye Pollack 05/09/2019

## 2019-05-09 NOTE — Consult Note (Signed)
[facility]    Date of Admission:  05/08/2019     Total days of antibiotics 1               Reason for Consult: Post-op Fever   Referring Provider: Cyndia Bent Primary Care Provider: Cassandria Anger, MD   Assessment/Plan:  [name] is a 78 y/o male POD #1 transcatheter aortic valve replacement who developed post-operative fever of unclear origin. Fevers have been responsive to Tylenol and most recent was 99.1. There was initial concern for coronavirus, however upon further evaluation he does not appear to have any risk factors/contacts through history and the only symptom he currently has is fever which may possibly be related to his multiple myeloma or anesthesia. Agree with additional work up of chest x-ray and blood cultures. He is feeling better now than earlier in the day and would suspect/hope this trend continues. Continue to monitor off antibiotics at this time. Encouraged deep breathing and coughing periodically to avoid atelectasis. Discussed course with Dr. Megan Salon who is in agreement and informed bedside nurse, charge nurse and Department Director of changes.   1. Discontinue COVID testing and pre-cautions. 2. Monitor fever curve, WBC count and culture results.  3. No indication for antibiotics presently. 4. Continue symptomatic management per primary team.   Principal Problem:   Postoperative fever Active Problems:   S/P TAVR (transcatheter aortic valve replacement)   HLD (hyperlipidemia)   Essential hypertension   PAF (paroxysmal atrial fibrillation) (HCC)   HTN (hypertension)   Hypercholesteremia   CAD (coronary artery disease)   Polymyalgia rheumatica syndrome (HCC)   Severe aortic stenosis   Multiple myeloma (HCC)   Acute on chronic diastolic heart failure ([place])   . aspirin  81 mg Oral Daily  . famotidine  20 mg Oral QHS  . loratadine  10 mg Oral Daily  . magnesium oxide  400 mg Oral BID  . metoprolol succinate  50 mg Oral  BID  . predniSONE  15 mg Oral Q breakfast  . sodium chloride flush  3 mL Intravenous Q12H     HPI: [name] [name] is a 78 y.o. male with previous medical history significant for hypertension, paroxysmal atrial fibrillation on anticoagulation, bicuspid aortic valve with severe aortic stenosis, and multiple myeloma admitted for transcatheter aortic valve replacement on 05/08/2019 which was completed without complication and tolerated well.  [name] deveoped a temperature of 102.8 overnight following the procedure and was having back pain and chest wall pain. All lines were previously removed with initial concern for his multiple myeloma. WBC count was normal. He subsequently defervesced and developed a secondary fever of 103.1 orally and 104.1 rectally. CVTS was notified with chest x-ray, UA and blood cultures ordered. Sars-CoV-2 was also ordered with previous testing on 05/04/2019 being negative.   Mr. Brau has received Cefuroxime and vancomycin in the last 24 hours. Most recent temperature is 100.3. Chest x-ray from 05/09/19 with mild interstitual edema.   [name] is not feeling well today on POD#1 from surgery. Has been cold overnight with chills requiring 3 blankets at one point. He is feeling better since onset of symptoms. Denies changes in taste/smell, nausea/vomiting, diarrhea, sore throat, muscle ache, headache or congestion. He has been at home for the past 5 months as a precaution and has had no contact with a person positive for COVID-19 or under suspicion for COVID-19.    Review of Systems: Review of Systems  Constitutional: Positive for chills,  fever and malaise/fatigue. Negative for weight loss.  Respiratory: Negative for cough, shortness of breath and wheezing.   Cardiovascular: Negative for chest pain and leg swelling.  Gastrointestinal: Negative for abdominal pain, constipation, diarrhea, nausea and vomiting.  Skin: Negative for rash.     Past Medical History:  Diagnosis  Date  . Acute blood loss anemia   . Adenomatous colon polyp   . Ascending aortic aneurysm ([place])   . Bicuspid aortic valve   . Chest pain    ETT-myoview 12/11 w/exercise, no chest pain, no significant ST changes, EF 69%, no evidence for ischemia or infarction.  . CHF NYHA class I (no symptoms from ordinary activities), acute, diastolic ([place])   . Fatty liver    mild  . GI bleed   . GI bleeding 07/21/2018   post polypectomy  . Hemorrhoids   . HTN (hypertension)   . Hypercholesteremia   . Hypokalemia   . Internal hemorrhoids   . LBP (low back pain)   . Moderate aortic stenosis   . Osteoarthritis   . Paroxysmal atrial fibrillation ([place])    a. new onset Afib in 07/2008. He underwent ibutilide cardioversion successfully. b. Recurrence 01/2013 s/p TEE/DCCV - was on Xarelto but he stopped it as he was convinced it was causing joint pn. c. Recurrence 01/2016 - spont conv to NSR. Pt took Eliquis x1 mo then declined further anticoag. d. Recurrence 07/2016.  [name] Pneumonia   . Tubular adenoma of colon     Social History   Tobacco Use  . Smoking status: Never Smoker  . Smokeless tobacco: Never Used  Substance Use Topics  . Alcohol use: Yes    Comment: Drinks 1 glass of wine nightly/socially  . Drug use: No    Family History  Problem Relation Age of Onset  . Colon cancer Mother 22  . Hypertension Other   . Coronary artery disease Neg Hx   . Colon polyps Neg Hx   . Esophageal cancer Neg Hx   . Rectal cancer Neg Hx   . Stomach cancer Neg Hx     Allergies  Allergen Reactions  . Xarelto [Rivaroxaban] Other (See Comments) and Hypertension    INCREASED BP-HYPERTENSIVE EVENTS  . Corticosteroids Other (See Comments)    Made the patient  "sick," feel "weird," and his "body rejected" them   . Ramipril Other (See Comments)    Could not eat or sleep, lost muscle mass  . Benazepril Cough    OBJECTIVE: Blood pressure (!) 116/58, pulse 89, temperature 100.3 F (37.9 C), temperature source  Oral, resp. rate (!) 31, height 5' 11" (1.803 m), weight 88.4 kg, SpO2 98 %.  Physical Exam Constitutional:      General: He is not in acute distress.    Appearance: He is well-developed. He is ill-appearing.  Cardiovascular:     Rate and Rhythm: Normal rate and regular rhythm.     Heart sounds: Murmur present.  Pulmonary:     Effort: Pulmonary effort is normal.     Breath sounds: Normal breath sounds. No wheezing, rhonchi or rales.  Abdominal:     General: Bowel sounds are normal.     Palpations: Abdomen is soft.     Tenderness: There is no abdominal tenderness. There is no rebound.  Skin:    General: Skin is warm and dry.  Neurological:     Mental Status: He is alert and oriented to person, place, and time.  Psychiatric:  Mood and Affect: Mood normal.     Lab Results Lab Results  Component Value Date   WBC 8.9 05/09/2019   HGB 13.3 05/09/2019   HCT 40.6 05/09/2019   MCV 95.3 05/09/2019   PLT 166 05/09/2019    Lab Results  Component Value Date   CREATININE 1.11 05/09/2019   BUN 16 05/09/2019   NA 139 05/09/2019   K 3.4 (L) 05/09/2019   CL 100 05/09/2019   CO2 29 05/09/2019    Lab Results  Component Value Date   ALT <5 05/04/2019   AST 23 05/04/2019   ALKPHOS 103 05/04/2019   BILITOT 0.8 05/04/2019     Microbiology: Recent Results (from the past 240 hour(s))  SARS CORONAVIRUS 2 Nasal Swab Aptima Multi Swab     Status: None   Collection Time: 05/04/19  2:14 PM   Specimen: Aptima Multi Swab; Nasal Swab  Result Value Ref Range Status   SARS Coronavirus 2 NEGATIVE NEGATIVE Final    Comment: (NOTE) SARS-CoV-2 target nucleic acids are NOT DETECTED. The SARS-CoV-2 RNA is generally detectable in upper and lower respiratory specimens during the acute phase of infection. Negative results do not preclude SARS-CoV-2 infection, do not rule out co-infections with other pathogens, and should not be used as the sole basis for treatment or other patient management  decisions. Negative results must be combined with clinical observations, patient history, and epidemiological information. The expected result is Negative. Fact Sheet for Patients: SugarRoll.be Fact Sheet for Healthcare Providers: https://www.woods-mathews.com/ This test is not yet approved or cleared by the [place] FDA and  has been authorized for detection and/or diagnosis of SARS-CoV-2 by FDA under an Emergency Use Authorization (EUA). This EUA will remain  in effect (meaning this test can be used) for the duration of the COVID-19 declaration under Section 56 4(b)(1) of the Act, 21 U.S.C. section 360bbb-3(b)(1), unless the authorization is terminated or revoked sooner. Performed at [facility], [place] [address] [place], [place] [address]   Surgical pcr screen     Status: None   Collection Time: 05/04/19  3:46 PM   Specimen: Nasal Mucosa; Nasal Swab  Result Value Ref Range Status   MRSA, PCR NEGATIVE NEGATIVE Final   Staphylococcus aureus NEGATIVE NEGATIVE Final    Comment: (NOTE) The Xpert SA Assay (FDA approved for NASAL specimens in patients 33 years of age and older), is one component of a comprehensive surveillance program. It is not intended to diagnose infection nor to guide or monitor treatment. Performed at [facility], [place] [address], [place], [place] [address]      Terri Piedra, [place] [facility] [phone] Pager  05/09/2019  3:56 PM

## 2019-05-10 ENCOUNTER — Encounter (HOSPITAL_COMMUNITY): Payer: Medicare Other | Admitting: Cardiology

## 2019-05-10 DIAGNOSIS — Z95828 Presence of other vascular implants and grafts: Secondary | ICD-10-CM

## 2019-05-10 LAB — CBC WITH DIFFERENTIAL/PLATELET
Abs Immature Granulocytes: 0.11 10*3/uL — ABNORMAL HIGH (ref 0.00–0.07)
Basophils Absolute: 0 10*3/uL (ref 0.0–0.1)
Basophils Relative: 0 %
Eosinophils Absolute: 0 10*3/uL (ref 0.0–0.5)
Eosinophils Relative: 0 %
HCT: 41.1 % (ref 39.0–52.0)
Hemoglobin: 13.7 g/dL (ref 13.0–17.0)
Immature Granulocytes: 1 %
Lymphocytes Relative: 8 %
Lymphs Abs: 0.7 10*3/uL (ref 0.7–4.0)
MCH: 31.6 pg (ref 26.0–34.0)
MCHC: 33.3 g/dL (ref 30.0–36.0)
MCV: 94.9 fL (ref 80.0–100.0)
Monocytes Absolute: 0.8 10*3/uL (ref 0.1–1.0)
Monocytes Relative: 9 %
Neutro Abs: 6.7 10*3/uL (ref 1.7–7.7)
Neutrophils Relative %: 82 %
Platelets: 141 10*3/uL — ABNORMAL LOW (ref 150–400)
RBC: 4.33 MIL/uL (ref 4.22–5.81)
RDW: 13.2 % (ref 11.5–15.5)
WBC: 8.3 10*3/uL (ref 4.0–10.5)
nRBC: 0 % (ref 0.0–0.2)

## 2019-05-10 LAB — URINALYSIS, ROUTINE W REFLEX MICROSCOPIC
Bilirubin Urine: NEGATIVE
Glucose, UA: NEGATIVE mg/dL
Ketones, ur: 5 mg/dL — AB
Leukocytes,Ua: NEGATIVE
Nitrite: NEGATIVE
Protein, ur: 100 mg/dL — AB
Specific Gravity, Urine: 1.018 (ref 1.005–1.030)
pH: 6 (ref 5.0–8.0)

## 2019-05-10 LAB — BASIC METABOLIC PANEL
Anion gap: 12 (ref 5–15)
BUN: 21 mg/dL (ref 8–23)
CO2: 26 mmol/L (ref 22–32)
Calcium: 8.7 mg/dL — ABNORMAL LOW (ref 8.9–10.3)
Chloride: 103 mmol/L (ref 98–111)
Creatinine, Ser: 1.23 mg/dL (ref 0.61–1.24)
GFR calc Af Amer: >60 mL/min (ref >60)
GFR calc non Af Amer: 55 mL/min — ABNORMAL LOW (ref >60)
Glucose, Bld: 106 mg/dL — ABNORMAL HIGH (ref 70–99)
Potassium: 4.1 mmol/L (ref 3.5–5.1)
Sodium: 141 mmol/L (ref 135–145)

## 2019-05-10 LAB — CBC
HCT: 41.3 % (ref 39.0–52.0)
Hemoglobin: 13.6 g/dL (ref 13.0–17.0)
MCH: 30.9 pg (ref 26.0–34.0)
MCHC: 32.9 g/dL (ref 30.0–36.0)
MCV: 93.9 fL (ref 80.0–100.0)
Platelets: 134 10*3/uL — ABNORMAL LOW (ref 150–400)
RBC: 4.4 MIL/uL (ref 4.22–5.81)
RDW: 13.2 % (ref 11.5–15.5)
WBC: 8.3 10*3/uL (ref 4.0–10.5)
nRBC: 0 % (ref 0.0–0.2)

## 2019-05-10 MED ORDER — METOPROLOL SUCCINATE ER 25 MG PO TB24
25.0000 mg | ORAL_TABLET | Freq: Two times a day (BID) | ORAL | Status: DC
  Administered 2019-05-10 (×2): 25 mg via ORAL
  Filled 2019-05-10 (×2): qty 1

## 2019-05-10 NOTE — Progress Notes (Addendum)
   05/10/19 0402  Vitals  Temp (!) 102.7 F (39.3 C)  Temp Source Oral  BP (!) 138/54  MAP (mmHg) 77  BP Location Right Arm  BP Method Automatic  Patient Position (if appropriate) Lying  Pulse Rate 82  Pulse Rate Source Monitor  ECG Heart Rate 85  Cardiac Rhythm NSR  Resp (!) 27  Oxygen Therapy  SpO2 93 %  O2 Device Room Air   Tylenol given, sponge bath given, encouraged for oral fluid intake. Encouraged to use incentive spirometer, fair effort, needed reinforcement. Auscultated right lung with coarse crepitation,left lung clear, No wheezing. Strong with non productive cough. Will continue to monitor.  Kennyth Lose, RN

## 2019-05-10 NOTE — Plan of Care (Signed)
Continue to monitor

## 2019-05-10 NOTE — Progress Notes (Signed)
Spoke with Pt's wife Ms.Zora Haugen. Update given, all question answered. She was tearfully worried and concerned about Pt had high temperature today. Emotional support given and encouraged to discuss with MD about plan of care tomorrow at am.    At shift changed, Pt apparently sleepy, but easily to arouse and awake, oriented x 4. His vital signs in normal range, no acute distress. Temp 98.9 orally, BP 112/52 mmHg, HR 79 NSR on monitor. Deniled pain, his wife stated Pt refused to eat his meals today, only ate a cup of pudding. He had been drowsy a whole day. Needed encouragement for more oral intake.     Will continue to monitor.  Aubreyana Saltz, BSN,RN,PCCN-CMC,CSC

## 2019-05-10 NOTE — Discharge Summary (Addendum)
[name] [name] VALVE TEAM  Discharge Summary    Patient ID: [name] [name] MRN: [id number]; DOB: 02-08-1941  Admit date: 05/08/2019 Discharge date: 05/11/2019  Primary Care Provider: Cassandria Anger, MD  Primary Cardiologist: Dr. Aundra Dubin / Dr. Angelena Form & Dr. Cyndia Bent (TAVR)  Discharge Diagnoses    Principal Problem:   S/P TAVR (transcatheter aortic valve replacement) Active Problems:   HLD (hyperlipidemia)   Essential hypertension   PAF (paroxysmal atrial fibrillation) (HCC)   HTN (hypertension)   Hypercholesteremia   CAD (coronary artery disease)   Polymyalgia rheumatica syndrome (HCC)   Severe aortic stenosis   Multiple myeloma (HCC)   Acute on chronic diastolic heart failure (HCC)   Postoperative fever   Allergies Allergies  Allergen Reactions  . Xarelto [Rivaroxaban] Other (See Comments) and Hypertension    INCREASED BP-HYPERTENSIVE EVENTS  . Corticosteroids Other (See Comments)    Made the patient  "sick," feel "weird," and his "body rejected" them   . Ramipril Other (See Comments)    Could not eat or sleep, lost muscle mass  . Benazepril Cough    Diagnostic Studies/Procedures     TAVR OPERATIVE NOTE   Date of Procedure:                05/08/2019  Preoperative Diagnosis:      Severe Aortic Stenosis   Postoperative Diagnosis:    Same   Procedure:        Transcatheter Aortic Valve Replacement - Percutaneous Right Transfemoral Approach             Edwards Sapien 3 Ultra THV (size 26 mm, model # 9750TFX, serial # [id number])             Edwards Sapien 3 Ultra THV (size 26 mm, model # 9750TFX, serial # [id number])  Co-Surgeons:                        Gaye Pollack, MD and Lauree Chandler, MD   Anesthesiologist:                  Perfecto Kingdom, MD  Echocardiographer:              Ena Dawley, MD  Pre-operative Echo Findings: ? Severe aortic stenosis ? Normal left ventricular systolic function   Post-operative Echo Findings: ? No paravalvular leak ? Normal left ventricular systolic function  _____________    Echo 05/09/19 IMPRESSIONS  1. - TAVR: A 26 mm S3 x 2 was placed (second valve placed due to PVL). A mild to moderate degree of prosthetic valve stenosis is present. There is no regurgitation or paravalvular leak detected. The V max is 3.6 m/s. The peak/mean gradient across the  valve is 49/29 mmHG. The LVOT VTI is estimated to be 34 cm and the LV is hyperdynamic with an intracavitary gradient ~6 mmHG. Overall, the increased gradients across the valve could be related to the ViV procedure (2 S3 placed) and the hyperdynamic state  of the LV (LVOT VTI 34 cm). The doppler velocity index is 0.57 and would indicate a mild degree of stenosis. The Vmax and gradients are increased compared with yesterday's valves after the TAVR. Clinical correlation is recommended.  2. The left ventricle has hyperdynamic systolic function, with an ejection fraction of >65%. The cavity size was small. Left ventricular diastolic Doppler parameters are consistent with pseudonormalization. Elevated mean left atrial pressure No evidence  of left ventricular regional wall  motion abnormalities.  3. The right ventricle has normal systolic function. The cavity was normal. There is no increase in right ventricular wall thickness.  4. Left atrial size was mildly dilated.  5. Right atrial size was mildly dilated.  6. The mitral valve is grossly normal. There is mild mitral annular calcification present.  7. The tricuspid valve is grossly normal.  8. A 26 an Edwards 2 Edwards S3 valves placed due to PVL during procedure valve is present in the aortic position. Procedure Date: 05/09/2019.  9. Mild-moderate stenosis of the aortic valve. 10. The aorta is normal unless otherwise noted. 11. The aortic root is normal in size and structure. 12. When compared to the prior study: S/p TAVR. V max and gradients higher across TAVR  compared with images after procedure yesterday.   History of Present Illness     [name] [name] is a 78 y.o. male with a history of HTN, PAF on Eliquis, anemia, ascending aortic aneurysm, HLD, recently diagnosed multiple myeloma during TAVR work up and severe AS who presented to [facility] on 05/08/19 for planned TAVR.  He is followed in our practice by Dr. Aundra Dubin and has been known to have aortic stenosis for several years. He has been see by Dr. Rayann Heman for management of his atrial fibrillation and has had multiple cardioversions in the past. He has been on Eliquis. Echo February 2020 with normal LV size and function, LVEF=60-65%. The aortic valve is thickened and calcified (mean gradient of 48 mmHg, peak gradient 65.9 mmHg, AVA 0.83 cm2) consistent with severe AS. No mitral valve abnormality. Coronary CTA February 2020 with mild CAD. His ascending aortic aneurysm was 4.3 cm on recent cardiac CTA. He reported a several month history of dyspnea with moderate exertion and fatigue. He was referred to Dr. Angelena Form for consideration of TAVR. Pre TAVR cath 03/07/19 showed no CAD and confirmed severe AS. Pre TAVR CT scans showed multiple indeterminate osseous lesions in the bony pelvis. Follow up MRI confirmed destructive bone lesions and PET scan showed diffuse osseous metastatic disease as detailed above without findings for a primary neoplasm in the chest, abdomen or pelvis. CT guided biopsy confirmed clear plasma cells. He was referred to Dr. Irene Limbo in the cancer center for evaluation who felt it was reasonable to proceed with TAVR prior to chemo treatment of his multiple myeloma. During this work up the patient had a subjective episode of afib and was treated with amiodarone. He self converted before planned DCCV.  The patient has been evaluated by the multidisciplinary valve team and felt to have severe, symptomatic aortic stenosis and to be a suitable candidate for TAVR, which was set up for 05/08/19.     Hospital  Course     Consultants: infectious disease   Severe AS: s/p successful TAVR with a 26 mm Edwards Sapien 3 UItra THV x2 (second valve placed due to PVL) via the TF approach on 05/08/19. Post operative echo showed hyperdynamic EF >65% with normally functioning TAVR with elevated mean gradient of 29 mm Hg (likely 2/2 hyperdynamic function in the setting of febrile illness and valve in valve procedure). We will follow this out on echo at 1 month. Groin sites are stable. ECG with sinus brady but no high grade heart block. He will be resumed on home Eliquis and started on a baby aspirin. I will see him back next week for TOC follow up.   HTN: Bp has been elevated. Resume home dose of Toprol XL '50mg'$  BID.  PAF: maintaining sinus but having frequent runs of PAF vs SVT. Resume home Toprol XL '50mg'$  BID and Eliquis '5mg'$  BID.  Acute on chronic diastolic CHF: as evidenced by an elevated BNP >1200 on preadmission lab work and pulmonary vascular congestion on CXR. This was treated with TAVR and he was treated with one dose of IV lasix. Repeat CXR showed resolution of pulmonary edema. Resume home HCTZ.  Post op fever: patient developed general malaise with very high temps post op (up to 104 deg F). He was seen by ID. Repeat UA, CXR and blood cultures were negative. WBC has remained normal. Etiology of fever unknown. He has been afebrile for 24 hours and we feel he is stable for DC home.   Multiple Myeloma: newly diagnosed during TAVR work up and followed by Dr. Irene Limbo.   PMR: continue chronic steroids.   _____________  Discharge Vitals Blood pressure (!) 148/78, pulse 61, temperature (!) 97.5 F (36.4 C), temperature source Oral, resp. rate 15, height '5\' 11"'$  (1.803 m), weight 86.9 kg, SpO2 97 %.  Filed Weights   05/09/19 0700 05/10/19 0706 05/11/19 0621  Weight: 88.4 kg 87.6 kg 86.9 kg    Labs & Radiologic Studies    CBC Recent Labs    05/10/19 0832 05/11/19 0352  WBC 8.3 7.4  NEUTROABS 6.7  --   HGB  13.7 12.5*  HCT 41.1 38.3*  MCV 94.9 93.9  PLT 141* 443*   Basic Metabolic Panel Recent Labs    05/09/19 0234  05/10/19 0329 05/11/19 0352  NA 139  --  141 141  K 3.3*   < > 4.1 3.7  CL 100  --  103 106  CO2 29  --  26 27  GLUCOSE 117*  --  106* 106*  BUN 16  --  21 22  CREATININE 1.11  --  1.23 1.05  CALCIUM 8.8*  --  8.7* 8.6*  MG 1.5*  --   --   --    < > = values in this interval not displayed.   Liver Function Tests No results for input(s): AST, ALT, ALKPHOS, BILITOT, PROT, ALBUMIN in the last 72 hours. No results for input(s): LIPASE, AMYLASE in the last 72 hours. Cardiac Enzymes No results for input(s): CKTOTAL, CKMB, CKMBINDEX, TROPONINI in the last 72 hours. BNP Invalid input(s): POCBNP D-Dimer No results for input(s): DDIMER in the last 72 hours. Hemoglobin A1C No results for input(s): HGBA1C in the last 72 hours. Fasting Lipid Panel No results for input(s): CHOL, HDL, LDLCALC, TRIG, CHOLHDL, LDLDIRECT in the last 72 hours. Thyroid Function Tests No results for input(s): TSH, T4TOTAL, T3FREE, THYROIDAB in the last 72 hours.  Invalid input(s): FREET3 _____________  Dg Chest 2 View  Result Date: 05/05/2019 CLINICAL DATA:  Preoperative, heart valve replacement EXAM: CHEST - 2 VIEW COMPARISON:  None. FINDINGS: Cardiomegaly. Mild diffuse interstitial pulmonary opacity and trace pleural effusions and/or pleural thickening. Disc degenerative disease of the thoracic spine. IMPRESSION: Cardiomegaly. Mild diffuse interstitial pulmonary opacity and trace pleural effusions and/or pleural thickening, findings likely reflecting mild edema. No focal airspace opacity. Electronically Signed   By: Eddie Candle M.D.   On: 05/05/2019 14:28   Ct Biopsy  Result Date: 04/16/2019 INDICATION: 78 year old male with a history of FDG positive bone lesions EXAM: CT BIOPSY MEDICATIONS: None. ANESTHESIA/SEDATION: Moderate (conscious) sedation was employed during this procedure. A total of  Versed 1.5 mg and Fentanyl 75 mcg was administered intravenously. Moderate Sedation Time: 10 minutes. The patient's  level of consciousness and vital signs were monitored continuously by radiology nursing throughout the procedure under my direct supervision. FLUOROSCOPY TIME:  CT COMPLICATIONS: None PROCEDURE: Informed written consent was obtained from the patient after a thorough discussion of the procedural risks, benefits and alternatives. All questions were addressed. Maximal Sterile Barrier Technique was utilized including caps, mask, sterile gowns, sterile gloves, sterile drape, hand hygiene and skin antiseptic. A timeout was performed prior to the initiation of the procedure. Patient position prone position on CT gantry table. Scout CT was acquired for planning purposes. Patient is prepped and draped in the usual sterile fashion. 1% lidocaine was used for local anesthesia. Using CT guidance guide needle was placed into the soft tissue component of posterior acetabular lesion. Multiple 18 gauge core biopsy were acquired. Patient tolerated the procedure well and remained hemodynamically stable throughout. No complications were encountered and no significant blood loss. IMPRESSION: Status post CT-guided biopsy of soft tissue lesion in the posterior acetabulum. Signed, Dulcy Fanny. Dellia Nims, RPVI [facility] Electronically Signed   By: Corrie Mckusick D.O.   On: 04/16/2019 13:06   Dg Chest Port 1 View  Result Date: 05/09/2019 CLINICAL DATA:  High fever and malaise. EXAM: PORTABLE CHEST 1 VIEW COMPARISON:  Single-view of the chest 05/08/2019. PA and lateral chest 05/04/2019. FINDINGS: There is cardiomegaly without edema. No consolidative process, pneumothorax or effusion. Atherosclerosis and aortic valve replacement noted. IMPRESSION: No acute disease. Electronically Signed   By: Inge Rise M.D.   On: 05/09/2019 15:38   Dg Chest Port 1 View   Result Date: 05/08/2019 CLINICAL DATA:  Status post TAVR EXAM: PORTABLE CHEST 1 VIEW COMPARISON:  05/04/2019 FINDINGS: Bilateral diffuse interstitial thickening. No pleural effusion or pneumothorax. Stable cardiomediastinal silhouette. Interval TAVR. No acute osseous abnormality. IMPRESSION: Interval TAVR. Bilateral interstitial thickening likely reflecting mild interstitial edema. Electronically Signed   By: Kathreen Devoid   On: 05/08/2019 14:01   Disposition   Pt is being discharged home today in good condition.  Follow-up Plans & Appointments    Follow-up Information    Eileen Stanford, PA-C. Go on 05/16/2019.   Specialties: Cardiology, Radiology Why: @ 1pm. please arrive at least 10 minutes early Contact information: [place] [address] [phone]            Discharge Medications   Allergies as of 05/11/2019      Reactions   Xarelto [rivaroxaban] Other (See Comments), Hypertension   INCREASED BP-HYPERTENSIVE EVENTS   Corticosteroids Other (See Comments)   Made the patient  "sick," feel "weird," and his "body rejected" them   Ramipril Other (See Comments)   Could not eat or sleep, lost muscle mass   Benazepril Cough      Medication List    STOP taking these medications   ibuprofen 200 MG tablet Commonly known as: ADVIL     TAKE these medications   apixaban 5 MG Tabs tablet Commonly known as: ELIQUIS Take 1 tablet (5 mg total) by mouth 2 (two) times daily.   aspirin 81 MG chewable tablet Chew 1 tablet (81 mg total) by mouth daily. Start taking on: May 12, 2019   famotidine 40 MG tablet Commonly known as: Pepcid Take 1 tablet (40 mg total) by mouth daily. What changed: when to take this   hydrochlorothiazide 12.5 MG capsule Commonly known as: MICROZIDE Take 12.5 mg by mouth every evening.   HYDROcodone-acetaminophen 5-325 MG tablet Commonly known as:  NORCO/VICODIN Take 1 tablet by mouth every 6 (six) hours as needed  for severe pain.   loratadine 10 MG tablet Commonly known as: CLARITIN Take 1 tablet (10 mg total) by mouth daily.   metoprolol succinate 50 MG 24 hr tablet Commonly known as: TOPROL-XL TAKE 1 TABLET (50 MG TOTAL) BY MOUTH 2 (TWO) TIMES DAILY. TAKE WITH OR IMMEDIATELY FOLLOWING A MEAL.   predniSONE 5 MG tablet Commonly known as: DELTASONE Take 3 tablets (15 mg total) by mouth daily with breakfast.   Vitamin D (Cholecalciferol) 50 MCG (2000 UT) Caps Take 2,000 Units by mouth daily.         Outstanding Labs/Studies   none  Duration of Discharge Encounter   Greater than 30 minutes including physician time.  Mable Fill, PA-C 05/11/2019, 10:37 AM [phone]

## 2019-05-10 NOTE — Care Management Important Message (Signed)
Important Message  Patient Details  Name: Danzell Wesling MRN: [id number] Date of Birth: [birth year]   Medicare Important Message Given:  Yes     Shelda Altes 05/10/2019, 2:24 PM

## 2019-05-10 NOTE — Progress Notes (Signed)
2 Days Post-Op Procedure(s) (LRB): TRANSCATHETER AORTIC VALVE REPLACEMENT, TRANSFEMORAL (N/A) TRANSESOPHAGEAL ECHOCARDIOGRAM (TEE) (N/A) Subjective: Feels better this am. Says he slept well.  Spiked temp to 102.7 this am, now back to 97.8 after Tylenol. Has remained hemodynamically stable. Drinking liquids without difficulty. His sore throat resolved. Likely due to TEE probe. Not hurting anywhere this am.  Objective: Vital signs in last 24 hours: Temp:  [97.8 F (36.6 C)-104.1 F (40.1 C)] 97.8 F (36.6 C) (08/27 0652) Pulse Rate:  [75-82] 82 (08/27 0402) Cardiac Rhythm: Normal sinus rhythm (08/27 0402) Resp:  [19-35] 29 (08/27 0706) BP: (107-138)/(47-70) 111/54 (08/27 0630) SpO2:  [87 %-99 %] 96 % (08/27 0652) Weight:  [87.6 kg] 87.6 kg (08/27 0706)  Hemodynamic parameters for last 24 hours:    Intake/Output from previous day: 08/26 0701 - 08/27 0700 In: 501.3 [P.O.:330; I.V.:71.3; IV Piggyback:100] Out: 930 [Urine:930] Intake/Output this shift: Total I/O In: -  Out: 200 [Urine:200]  General appearance: alert and cooperative Neurologic: intact Heart: regular rate and rhythm and systolic flow murmur. Lungs: clear to auscultation bilaterally Abdomen: soft, non-tender; bowel sounds normal; no masses,  no organomegaly Extremities: extremities normal, atraumatic, no cyanosis or edema Wound: groin sites ok  Lab Results: Recent Labs    05/09/19 0234 05/10/19 0329  WBC 8.9 8.3  HGB 13.3 13.6  HCT 40.6 41.3  PLT 166 134*   BMET:  Recent Labs    05/09/19 0234 05/09/19 0819 05/10/19 0329  NA 139  --  141  K 3.3* 3.4* 4.1  CL 100  --  103  CO2 29  --  26  GLUCOSE 117*  --  106*  BUN 16  --  21  CREATININE 1.11  --  1.23  CALCIUM 8.8*  --  8.7*    PT/INR: No results for input(s): LABPROT, INR in the last 72 hours. ABG    Component Value Date/Time   PHART 7.462 (H) 05/04/2019 1545   HCO3 28.3 (H) 05/04/2019 1545   TCO2 26 05/08/2019 1200   O2SAT 97.4  05/04/2019 1545   CBG (last 3)  No results for input(s): GLUCAP in the last 72 hours.  Assessment/Plan: S/P Procedure(s) (LRB): TRANSCATHETER AORTIC VALVE REPLACEMENT, TRANSFEMORAL (N/A) TRANSESOPHAGEAL ECHOCARDIOGRAM (TEE) (N/A)  POD 2 Hemodynamically stable in sinus rhythm. Will decrease Lopressor to 25 bid with fever in case he drops BP.  Fever of unknown etiology. BC pending. WBC remains normal. Nothing abnormal on exam. Only line is peripheral IV which looks fine. CXR clear. UA unremarkable. Continuing Zinacef. Will see if ID has any other rec.   Continue IS, ambulate.   LOS: 2 days    Gaye Pollack 05/10/2019

## 2019-05-10 NOTE — Progress Notes (Signed)
[facility]  Date of Admission:  05/08/2019     Total days of antibiotics 2         ASSESSMENT/PLAN  [name] [name] is feeling better today and has a downward trending fever curve. Blood cultures are without growth in <24 and chest x-ray is without active disease. Surgical incisions appear without infection. There does not appear to be any significant source of infection to explain his post-operative fevers. He completed his cefuroxime today. Fevers appear to be related post-op and may be anesthesia or possibly from his multiple myeloma. Recommend continuing to monitor without any additional antibiotics given no significant source of infection and he is feeling better and treat fevers symptomatically as needed.   1. Continue to monitor fever curve and WBC count. 2. Will monitor cultures. 3. Continue incentive spirometry to prevent atelectasis.   Principal Problem:   Postoperative fever Active Problems:   S/P TAVR (transcatheter aortic valve replacement)   HLD (hyperlipidemia)   Essential hypertension   PAF (paroxysmal atrial fibrillation) (HCC)   HTN (hypertension)   Hypercholesteremia   CAD (coronary artery disease)   Polymyalgia rheumatica syndrome (HCC)   Severe aortic stenosis   Multiple myeloma (HCC)   Acute on chronic diastolic heart failure ([place])   . aspirin  81 mg Oral Daily  . famotidine  20 mg Oral QHS  . loratadine  10 mg Oral Daily  . magnesium oxide  400 mg Oral BID  . metoprolol succinate  25 mg Oral BID  . predniSONE  15 mg Oral Q breakfast  . sodium chloride flush  3 mL Intravenous Q12H    SUBJECTIVE:  Febrile this morning. No acute events overnight. Feeling better today and was able to walk with cardiac rehabilitation today.   Allergies  Allergen Reactions  . Xarelto [Rivaroxaban] Other (See Comments) and Hypertension    INCREASED BP-HYPERTENSIVE EVENTS  . Corticosteroids Other (See Comments)    Made the patient  "sick," feel  "weird," and his "body rejected" them   . Ramipril Other (See Comments)    Could not eat or sleep, lost muscle mass  . Benazepril Cough     Review of Systems: Review of Systems  Constitutional: Positive for fever. Negative for chills and weight loss.  Respiratory: Negative for cough, shortness of breath and wheezing.   Cardiovascular: Negative for chest pain and leg swelling.  Gastrointestinal: Negative for abdominal pain, constipation, diarrhea, nausea and vomiting.  Skin: Negative for rash.    OBJECTIVE: Vitals:   05/10/19 0652 05/10/19 0706 05/10/19 0821 05/10/19 0908  BP:   125/71 128/67  Pulse:    72  Resp: 19 (!) 29 (!) 27 20  Temp: 97.8 F (36.6 C)  100.1 F (37.8 C) 98.6 F (37 C)  TempSrc: Oral  Oral Oral  SpO2: 96%   94%  Weight:  87.6 kg    Height:       Body mass index is 26.94 kg/m.  Physical Exam Constitutional:      General: He is not in acute distress.    Appearance: Normal appearance. He is well-developed. He is not ill-appearing.  Cardiovascular:     Rate and Rhythm: Normal rate and regular rhythm.     Heart sounds: Murmur present.  Pulmonary:     Effort: Pulmonary effort is normal.     Breath sounds: Normal breath sounds. No wheezing, rhonchi or rales.  Skin:    General: Skin is warm and dry.  Neurological:  Mental Status: He is alert and oriented to person, place, and time.  Psychiatric:        Behavior: Behavior normal.        Thought Content: Thought content normal.        Judgment: Judgment normal.     Lab Results Lab Results  Component Value Date   WBC 8.3 05/10/2019   HGB 13.7 05/10/2019   HCT 41.1 05/10/2019   MCV 94.9 05/10/2019   PLT 141 (L) 05/10/2019    Lab Results  Component Value Date   CREATININE 1.23 05/10/2019   BUN 21 05/10/2019   NA 141 05/10/2019   K 4.1 05/10/2019   CL 103 05/10/2019   CO2 26 05/10/2019    Lab Results  Component Value Date   ALT <5 05/04/2019   AST 23 05/04/2019   ALKPHOS 103  05/04/2019   BILITOT 0.8 05/04/2019     Microbiology: Recent Results (from the past 240 hour(s))  SARS CORONAVIRUS 2 Nasal Swab Aptima Multi Swab     Status: None   Collection Time: 05/04/19  2:14 PM   Specimen: Aptima Multi Swab; Nasal Swab  Result Value Ref Range Status   SARS Coronavirus 2 NEGATIVE NEGATIVE Final    Comment: (NOTE) SARS-CoV-2 target nucleic acids are NOT DETECTED. The SARS-CoV-2 RNA is generally detectable in upper and lower respiratory specimens during the acute phase of infection. Negative results do not preclude SARS-CoV-2 infection, do not rule out co-infections with other pathogens, and should not be used as the sole basis for treatment or other patient management decisions. Negative results must be combined with clinical observations, patient history, and epidemiological information. The expected result is Negative. Fact Sheet for Patients: SugarRoll.be Fact Sheet for Healthcare Providers: https://www.woods-mathews.com/ This test is not yet approved or cleared by the [place] FDA and  has been authorized for detection and/or diagnosis of SARS-CoV-2 by FDA under an Emergency Use Authorization (EUA). This EUA will remain  in effect (meaning this test can be used) for the duration of the COVID-19 declaration under Section 56 4(b)(1) of the Act, 21 U.S.C. section 360bbb-3(b)(1), unless the authorization is terminated or revoked sooner. Performed at [facility], [place] [address], [place], [place] [address]   Surgical pcr screen     Status: None   Collection Time: 05/04/19  3:46 PM   Specimen: Nasal Mucosa; Nasal Swab  Result Value Ref Range Status   MRSA, PCR NEGATIVE NEGATIVE Final   Staphylococcus aureus NEGATIVE NEGATIVE Final    Comment: (NOTE) The Xpert SA Assay (FDA approved for NASAL specimens in patients 60 years of age and older), is one component of a comprehensive surveillance program. It is  not intended to diagnose infection nor to guide or monitor treatment. Performed at [facility], [place] [address], [place], [place] [address]      Terri Piedra, [place] [facility] [phone] Pager  05/10/2019  10:30 AM

## 2019-05-10 NOTE — Progress Notes (Signed)
CARDIAC REHAB PHASE I   PRE:  Rate/Rhythm: 73 SR  BP:  Sitting: 125/71      SaO2: 97 RA  MODE:  Ambulation: 400 ft   POST:  Rate/Rhythm: 80 SR  BP:  Sitting: 128/67    SaO2: 95 RA   Pt ambulated 473ft in hallway standby assist with steady gait. Pt denies CP, dizziness or SOB. Pt returned to recliner, call bell and bedside table within reach. Encouraged to ambulate as able, but not over do it today. Will continue to follow.  [id number] Rufina Falco, RN BSN 05/10/2019 9:04 AM

## 2019-05-11 DIAGNOSIS — I350 Nonrheumatic aortic (valve) stenosis: Secondary | ICD-10-CM

## 2019-05-11 DIAGNOSIS — Z954 Presence of other heart-valve replacement: Secondary | ICD-10-CM

## 2019-05-11 DIAGNOSIS — R079 Chest pain, unspecified: Secondary | ICD-10-CM

## 2019-05-11 DIAGNOSIS — R509 Fever, unspecified: Secondary | ICD-10-CM

## 2019-05-11 LAB — BASIC METABOLIC PANEL
Anion gap: 8 (ref 5–15)
BUN: 22 mg/dL (ref 8–23)
CO2: 27 mmol/L (ref 22–32)
Calcium: 8.6 mg/dL — ABNORMAL LOW (ref 8.9–10.3)
Chloride: 106 mmol/L (ref 98–111)
Creatinine, Ser: 1.05 mg/dL (ref 0.61–1.24)
GFR calc Af Amer: >60 mL/min (ref >60)
GFR calc non Af Amer: >60 mL/min (ref >60)
Glucose, Bld: 106 mg/dL — ABNORMAL HIGH (ref 70–99)
Potassium: 3.7 mmol/L (ref 3.5–5.1)
Sodium: 141 mmol/L (ref 135–145)

## 2019-05-11 LAB — CBC
HCT: 38.3 % — ABNORMAL LOW (ref 39.0–52.0)
Hemoglobin: 12.5 g/dL — ABNORMAL LOW (ref 13.0–17.0)
MCH: 30.6 pg (ref 26.0–34.0)
MCHC: 32.6 g/dL (ref 30.0–36.0)
MCV: 93.9 fL (ref 80.0–100.0)
Platelets: 131 10*3/uL — ABNORMAL LOW (ref 150–400)
RBC: 4.08 MIL/uL — ABNORMAL LOW (ref 4.22–5.81)
RDW: 13.2 % (ref 11.5–15.5)
WBC: 7.4 10*3/uL (ref 4.0–10.5)
nRBC: 0 % (ref 0.0–0.2)

## 2019-05-11 MED ORDER — METOPROLOL SUCCINATE ER 50 MG PO TB24
50.0000 mg | ORAL_TABLET | Freq: Two times a day (BID) | ORAL | Status: DC
  Administered 2019-05-11: 50 mg via ORAL
  Filled 2019-05-11: qty 1

## 2019-05-11 MED ORDER — ASPIRIN 81 MG PO CHEW: 81.0000 mg | CHEWABLE_TABLET | Freq: Every day | ORAL | Status: DC

## 2019-05-11 MED FILL — Heparin Sodium (Porcine) Inj 1000 Unit/ML: INTRAMUSCULAR | Qty: 30 | Status: AC

## 2019-05-11 MED FILL — Magnesium Sulfate Inj 50%: INTRAMUSCULAR | Qty: 10 | Status: AC

## 2019-05-11 MED FILL — Potassium Chloride Inj 2 mEq/ML: INTRAVENOUS | Qty: 40 | Status: AC

## 2019-05-11 NOTE — Progress Notes (Signed)
Spoke with Pt's spouse, Ms. Stinton, update given, all questions answered. She appreciated for update calling.   No acute distress noted.Vital signs stable, afebrile when approached Pt, appeared alert, present, oriented x 4, more brightened and  talkative this evening when I assessed him.  Denied pain, rested comfortably on his bed. Auscultated both lungs sound clear. He had strong effort using incentive spirometer, got 2000/2500 ml. SPO2 97% with room air.  Stated having skin rash around perineal area, look alike moisture associated skin reaction. Peri care given, antifungal powder applied. Surgical wound bilateral groins look good, dry and clean.   Stated having poor apatite and food was not taste good in hospital. Plan of care reviewed, over all Pt is progressing today. Will continue to monitor.  Eusebio Me, PCCN-CMC,CSC

## 2019-05-11 NOTE — Progress Notes (Signed)
3 Days Post-Op Procedure(s) (LRB): TRANSCATHETER AORTIC VALVE REPLACEMENT, TRANSFEMORAL (N/A) TRANSESOPHAGEAL ECHOCARDIOGRAM (TEE) (N/A) Subjective: No complaints. Feels much better.  Objective: Vital signs in last 24 hours: Temp:  [97.5 F (36.4 C)-100.1 F (37.8 C)] 97.5 F (36.4 C) (08/28 0757) Pulse Rate:  [61-72] 61 (08/28 0757) Cardiac Rhythm: Normal sinus rhythm (08/28 0346) Resp:  [12-27] 23 (08/28 0757) BP: (124-150)/(62-80) 150/80 (08/28 0757) SpO2:  [93 %-99 %] 97 % (08/28 0757) Weight:  [86.9 kg] 86.9 kg (08/28 0621)  Hemodynamic parameters for last 24 hours:    Intake/Output from previous day: 08/27 0701 - 08/28 0700 In: 837.2 [P.O.:610; I.V.:227.2] Out: 970 [Urine:970] Intake/Output this shift: No intake/output data recorded.  General appearance: alert and cooperative Neurologic: intact Heart: regular rate and rhythm, S1, S2 normal, systolic flow murmur. Lungs: clear to auscultation bilaterally Extremities: extremities normal without edema. Wound: groin sites ok  Lab Results: Recent Labs    05/10/19 0832 05/11/19 0352  WBC 8.3 7.4  HGB 13.7 12.5*  HCT 41.1 38.3*  PLT 141* 131*   BMET:  Recent Labs    05/10/19 0329 05/11/19 0352  NA 141 141  K 4.1 3.7  CL 103 106  CO2 26 27  GLUCOSE 106* 106*  BUN 21 22  CREATININE 1.23 1.05  CALCIUM 8.7* 8.6*    PT/INR: No results for input(s): LABPROT, INR in the last 72 hours. ABG    Component Value Date/Time   PHART 7.462 (H) 05/04/2019 1545   HCO3 28.3 (H) 05/04/2019 1545   TCO2 26 05/08/2019 1200   O2SAT 97.4 05/04/2019 1545   CBG (last 3)  No results for input(s): GLUCAP in the last 72 hours.  Assessment/Plan: S/P Procedure(s) (LRB): TRANSCATHETER AORTIC VALVE REPLACEMENT, TRANSFEMORAL (N/A) TRANSESOPHAGEAL ECHOCARDIOGRAM (TEE) (N/A)  POD 3 Hemodynamically stable in sinus rhythm. Will resume Toprol 50 bid as before.  He has been afebrile for the past 24 hrs, WBC normal this am, BC  negative so far. He looks fine and feels much better. Etiology of fevers unknown.   I think he can go home today.   LOS: 3 days    Gaye Pollack 05/11/2019

## 2019-05-11 NOTE — Progress Notes (Signed)
CARDIAC REHAB PHASE I   Offered to ambulate with pt, pt declines, stating he is going home today. Pt denies any difficulty with ambulation. Pt educated on restrictions, site care, and exercise guidelines. Pt declining CRP II.   [id number] Rufina Falco, RN BSN 05/11/2019 8:58 AM

## 2019-05-11 NOTE — Plan of Care (Signed)
Continue to monitor

## 2019-05-11 NOTE — Progress Notes (Signed)
IV's and telemetry discontinued at this time. CCMD notified. Discharge instructions reviewed with patient. All questions answered.   Emelda Fear, RN

## 2019-05-11 NOTE — Progress Notes (Signed)
Patient ID: [name] [name], male   DOB: [birth year], 78 y.o.   MRN: [id number]         [facility] Infect[facility]  Date of Admission:  05/08/2019     ASSESSMENT: The cause of his transient postoperative fever remains uncertain.  His exam is unremarkable as are negative.  I am in agreement with discharge home today.  PLAN: 1. Please call if we can be of further assistance  Principal Problem:   S/P TAVR (transcatheter aortic valve replacement) Active Problems:   HLD (hyperlipidemia)   Essential hypertension   PAF (paroxysmal atrial fibrillation) (HCC)   HTN (hypertension)   Hypercholesteremia   CAD (coronary artery disease)   Polymyalgia rheumatica syndrome (HCC)   Severe aortic stenosis   Multiple myeloma (HCC)   Acute on chronic diastolic heart failure (HCC)   Postoperative fever   Scheduled Meds: . aspirin  81 mg Oral Daily  . famotidine  20 mg Oral QHS  . loratadine  10 mg Oral Daily  . magnesium oxide  400 mg Oral BID  . metoprolol succinate  50 mg Oral BID  . predniSONE  15 mg Oral Q breakfast  . sodium chloride flush  3 mL Intravenous Q12H   Continuous Infusions: . sodium chloride 20 mL/hr at 05/09/19 2332   PRN Meds:.sodium chloride, acetaminophen **OR** acetaminophen, morphine injection, ondansetron (ZOFRAN) IV, oxyCODONE, sodium chloride flush, traMADol   SUBJECTIVE: He is feeling much better.  Review of Systems: Review of Systems  Constitutional: Negative for chills, diaphoresis and fever.  Respiratory: Negative for cough, sputum production and shortness of breath.   Cardiovascular: Positive for chest pain.  Gastrointestinal: Negative for abdominal pain, diarrhea, nausea and vomiting.  Genitourinary: Negative for dysuria.  Musculoskeletal: Negative for myalgias.  Neurological: Negative for headaches.    Allergies  Allergen Reactions  . Xarelto [Rivaroxaban] Other (See Comments) and Hypertension    INCREASED BP-HYPERTENSIVE EVENTS  .  Corticosteroids Other (See Comments)    Made the patient  "sick," feel "weird," and his "body rejected" them   . Ramipril Other (See Comments)    Could not eat or sleep, lost muscle mass  . Benazepril Cough    OBJECTIVE: Vitals:   05/10/19 2348 05/11/19 0445 05/11/19 0621 05/11/19 0757  BP: 140/80 (!) 145/80  (!) 150/80  Pulse: 66   61  Resp: (!) 22 (!) 23 18 (!) 23  Temp: 97.7 F (36.5 C) (!) 97.5 F (36.4 C)  (!) 97.5 F (36.4 C)  TempSrc: Oral Oral  Oral  SpO2: 97% 99% 94% 97%  Weight:   86.9 kg   Height:       Body mass index is 26.71 kg/m.  Physical Exam Constitutional:      Comments: He is in good spirits.  Cardiovascular:     Rate and Rhythm: Normal rate and regular rhythm.     Heart sounds: Murmur present.  Pulmonary:     Effort: Pulmonary effort is normal.     Breath sounds: Normal breath sounds.  Abdominal:     Palpations: Abdomen is soft.     Tenderness: There is no abdominal tenderness.  Skin:    Findings: No rash.     Comments: IV and groin sites look good.  Psychiatric:        Mood and Affect: Mood normal.     Lab Results Lab Results  Component Value Date   WBC 7.4 05/11/2019   HGB 12.5 (L) 05/11/2019   HCT 38.3 (L) 05/11/2019  MCV 93.9 05/11/2019   PLT 131 (L) 05/11/2019    Lab Results  Component Value Date   CREATININE 1.05 05/11/2019   BUN 22 05/11/2019   NA 141 05/11/2019   K 3.7 05/11/2019   CL 106 05/11/2019   CO2 27 05/11/2019    Lab Results  Component Value Date   ALT <5 05/04/2019   AST 23 05/04/2019   ALKPHOS 103 05/04/2019   BILITOT 0.8 05/04/2019     Microbiology: Recent Results (from the past 240 hour(s))  SARS CORONAVIRUS 2 Nasal Swab Aptima Multi Swab     Status: None   Collection Time: 05/04/19  2:14 PM   Specimen: Aptima Multi Swab; Nasal Swab  Result Value Ref Range Status   SARS Coronavirus 2 NEGATIVE NEGATIVE Final    Comment: (NOTE) SARS-CoV-2 target nucleic acids are NOT DETECTED. The SARS-CoV-2 RNA  is generally detectable in upper and lower respiratory specimens during the acute phase of infection. Negative results do not preclude SARS-CoV-2 infection, do not rule out co-infections with other pathogens, and should not be used as the sole basis for treatment or other patient management decisions. Negative results must be combined with clinical observations, patient history, and epidemiological information. The expected result is Negative. Fact Sheet for Patients: SugarRoll.be Fact Sheet for Healthcare Providers: https://www.woods-mathews.com/ This test is not yet approved or cleared by the [place] FDA and  has been authorized for detection and/or diagnosis of SARS-CoV-2 by FDA under an Emergency Use Authorization (EUA). This EUA will remain  in effect (meaning this test can be used) for the duration of the COVID-19 declaration under Section 56 4(b)(1) of the Act, 21 U.S.C. section 360bbb-3(b)(1), unless the authorization is terminated or revoked sooner. Performed at [facility], [place] [address], [place], [place] [address]   Surgical pcr screen     Status: None   Collection Time: 05/04/19  3:46 PM   Specimen: Nasal Mucosa; Nasal Swab  Result Value Ref Range Status   MRSA, PCR NEGATIVE NEGATIVE Final   Staphylococcus aureus NEGATIVE NEGATIVE Final    Comment: (NOTE) The Xpert SA Assay (FDA approved for NASAL specimens in patients 39 years of age and older), is one component of a comprehensive surveillance program. It is not intended to diagnose infection nor to guide or monitor treatment. Performed at [facility], [place] [address], [place], [place] [address]   Culture, blood (routine x 2)     Status: None (Preliminary result)   Collection Time: 05/09/19 12:35 PM   Specimen: BLOOD  Result Value Ref Range Status   Specimen Description BLOOD LEFT ANTECUBITAL  Final   Special Requests   Final    BOTTLES DRAWN AEROBIC  ONLY Blood Culture adequate volume   Culture   Final    NO GROWTH 1 DAY Performed at [facility], [place] [address], [place], [place] [address]    Report Status PENDING  Incomplete  Culture, blood (routine x 2)     Status: None (Preliminary result)   Collection Time: 05/09/19 12:40 PM   Specimen: BLOOD  Result Value Ref Range Status   Specimen Description BLOOD LEFT ANTECUBITAL  Final   Special Requests   Final    BOTTLES DRAWN AEROBIC ONLY Blood Culture adequate volume   Culture   Final    NO GROWTH 1 DAY Performed at [facility], [place] [address] [place], [place] [address]    Report Status PENDING  Incomplete  Michel Bickers, MD [facility] [phone] pager   [phone] cell 05/11/2019, 9:52 AM

## 2019-05-14 ENCOUNTER — Telehealth: Payer: Self-pay | Admitting: Physician Assistant

## 2019-05-14 LAB — CULTURE, BLOOD (ROUTINE X 2)
Culture: NO GROWTH
Culture: NO GROWTH
Special Requests: ADEQUATE
Special Requests: ADEQUATE

## 2019-05-14 NOTE — Telephone Encounter (Signed)
  [facility] VALVE TEAM   Patient contacted regarding discharge from [facility] on 8/28  Patient understands to follow up with provider Nell Range on 9/2 at [facility].  Patient understands discharge instructions? yes Patient understands medications and regimen? yes Patient understands to bring all medications to this visit? Yes  Feeling better. No more fevers. Has had resolution in chest pain that he had prior to TAVR and breathing better.   Angelena Form PA-C  [facility]

## 2019-05-14 NOTE — Progress Notes (Signed)
[facility] [place]                                       Cardiology Office Note    Date:  05/16/2019   ID:  [name] [name], DOB 18-Jan-1941, MRN [id number]  PCP:  Cassandria Anger, MD  Cardiologist:  Dr. Aundra Dubin / Dr. Angelena Form & Dr. Cyndia Bent (TAVR)  CC: [facility] s/p TAVR  History of Present Illness:  [name] [name] is a 78 y.o. male with a history of HTN, PAF on Eliquis, anemia, ascending aortic aneurysm, HLD, recently diagnosed multiple myeloma during TAVR work up and severe AS s/p TAVR (05/08/19) who presents to clinic for follow up.  He is followed in our practice by Dr. Aundra Dubin and has been known to have aortic stenosis for several years. He has been see by Dr. Rayann Heman for management of his atrial fibrillation and has had multiple cardioversions in the past. He has been on Eliquis. Echo February 2020 with normal LV size and function, LVEF=60-65%. The aortic valve is thickened and calcified (mean gradient of 48 mmHg, peak gradient 65.9 mmHg, AVA 0.83 cm2) consistent with severe AS. Coronary CTA February 2020 with mild CAD. His ascending aortic aneurysm was 4.3 cm on recent cardiac CTA. He reported a several month history of dyspnea with moderate exertion and fatigue.He was referred to Dr. Angelena Form for consideration of TAVR. Pre TAVR cath 03/07/19 showed no CAD and confirmed severe AS. Pre TAVR CT scans showed multiple indeterminate osseous lesions in the bony pelvis. Follow up MRI confirmed destructive bone lesions and PET scan showed diffuse osseous metastatic disease as detailed above without findings for a primary neoplasm in the chest, abdomen or pelvis. CT guided biopsy confirmed clear plasma cells. He was referred to Dr. Irene Limbo in the cancer center for evaluation who felt it was reasonable to proceed with TAVR prior to chemo treatment of his multiple myeloma. During this work up the patient had a subjective episode of afib and was treated with amiodarone. He  self converted before planned DCCV.  He was evaluated by the multidisciplinary valve team and underwent successful TAVR with a 26 mm Edwards Sapien 3 UItra THV x2 (second valve placed due to PVL) via the TF approach on 05/08/19. The following night, [name] [name] developed a post operative fever of unknown significance. He was seen by ID. Repeat WBC, UA, CXR and blood cultures were negative. Post operative echo showed hyperdynamic EF >65% with normally functioning TAVR with elevated mean gradient of 29 mm Hg (likely 2/2 hyperdynamic function in the setting of febrile illness and valve in valve procedure). He had some mild volume overload and treated with one dose of IV lasix. Also had frequent runs of SVT vs afib with RVR and home BB was resumed. He was discharged when fever resolved on home Eliquis and aspirin added.   Today he presents to clinic for follow up. He is doing okay. No CP or SOB. He had has worsening LE edema but no orthopnea or PND. No dizziness or syncope. No blood in stool or urine. No palpitations. HR has been steady at home. Overall, he just feels like he is doing okay. He is going to see Dr. Irene Limbo next.   Past Medical History:  Diagnosis Date   Acute blood loss anemia    Adenomatous colon polyp    Ascending aortic aneurysm ([place])  Bicuspid aortic valve    Chest pain    ETT-myoview 12/11 w/exercise, no chest pain, no significant ST changes, EF 69%, no evidence for ischemia or infarction.   CHF NYHA class I (no symptoms from ordinary activities), acute, diastolic (HCC)    Fatty liver    mild   GI bleed    GI bleeding 07/21/2018   post polypectomy   Hemorrhoids    HTN (hypertension)    Hypercholesteremia    Hypokalemia    Internal hemorrhoids    LBP (low back pain)    Moderate aortic stenosis    Osteoarthritis    Paroxysmal atrial fibrillation ([place])    a. new onset Afib in 07/2008. He underwent ibutilide cardioversion successfully. b. Recurrence 01/2013 s/p  TEE/DCCV - was on Xarelto but he stopped it as he was convinced it was causing joint pn. c. Recurrence 01/2016 - spont conv to NSR. Pt took Eliquis x1 mo then declined further anticoag. d. Recurrence 07/2016.   Pneumonia    Tubular adenoma of colon     Past Surgical History:  Procedure Laterality Date   BACK SURGERY  x12 years ago   CARDIOVERSION N/A 01/26/2013   Procedure: CARDIOVERSION;  Surgeon: Larey Dresser, MD;  Location: [place];  Service: Cardiovascular;  Laterality: N/A;   CARDIOVERSION N/A 10/28/2017   Procedure: CARDIOVERSION;  Surgeon: Larey Dresser, MD;  Location: [facility] ENDOSCOPY;  Service: Cardiovascular;  Laterality: N/A;   CARDIOVERSION N/A 03/03/2018   Procedure: CARDIOVERSION;  Surgeon: Lelon Perla, MD;  Location: [facility] ENDOSCOPY;  Service: Cardiovascular;  Laterality: N/A;   COLONOSCOPY     COLONOSCOPY  07/17/2018   at [place]     RIGHT/LEFT HEART CATH AND CORONARY ANGIOGRAPHY N/A 03/07/2019   Procedure: RIGHT/LEFT HEART CATH AND CORONARY ANGIOGRAPHY;  Surgeon: Burnell Blanks, MD;  Location: [place] CV LAB;  Service: Cardiovascular;  Laterality: N/A;   TEE WITHOUT CARDIOVERSION N/A 01/26/2013   Procedure: TRANSESOPHAGEAL ECHOCARDIOGRAM (TEE);  Surgeon: Larey Dresser, MD;  Location: [place];  Service: Cardiovascular;  Laterality: N/A;   TEE WITHOUT CARDIOVERSION N/A 10/28/2017   Procedure: TRANSESOPHAGEAL ECHOCARDIOGRAM (TEE);  Surgeon: Larey Dresser, MD;  Location: [facility] ENDOSCOPY;  Service: Cardiovascular;  Laterality: N/A;   TEE WITHOUT CARDIOVERSION N/A 05/08/2019   Procedure: TRANSESOPHAGEAL ECHOCARDIOGRAM (TEE);  Surgeon: Burnell Blanks, MD;  Location: [place] CV LAB;  Service: Open Heart Surgery;  Laterality: N/A;   TRANSCATHETER AORTIC VALVE REPLACEMENT, TRANSFEMORAL N/A 05/08/2019   Procedure: TRANSCATHETER AORTIC VALVE REPLACEMENT, TRANSFEMORAL;  Surgeon:  Burnell Blanks, MD;  Location: [place] CV LAB;  Service: Open Heart Surgery;  Laterality: N/A;    Current Medications: Outpatient Medications Prior to Visit  Medication Sig Dispense Refill   apixaban (ELIQUIS) 5 MG TABS tablet Take 1 tablet (5 mg total) by mouth 2 (two) times daily. 60 tablet 6   aspirin 81 MG chewable tablet Chew 1 tablet (81 mg total) by mouth daily.     famotidine (PEPCID) 40 MG tablet Take 1 tablet (40 mg total) by mouth daily. (Patient taking differently: Take 40 mg by mouth at bedtime. ) 30 tablet 11   HYDROcodone-acetaminophen (NORCO/VICODIN) 5-325 MG tablet Take 1 tablet by mouth every 6 (six) hours as needed for severe pain. 60 tablet 0   loratadine (CLARITIN) 10 MG tablet Take 1 tablet (10 mg total) by mouth daily. 30 tablet 11  metoprolol succinate (TOPROL-XL) 50 MG 24 hr tablet TAKE 1 TABLET (50 MG TOTAL) BY MOUTH 2 (TWO) TIMES DAILY. TAKE WITH OR IMMEDIATELY FOLLOWING A MEAL. 180 tablet 3   predniSONE (DELTASONE) 5 MG tablet Take 3 tablets (15 mg total) by mouth daily with breakfast. 90 tablet 3   Vitamin D, Cholecalciferol, 50 MCG (2000 UT) CAPS Take 2,000 Units by mouth daily.     hydrochlorothiazide (MICROZIDE) 12.5 MG capsule Take 12.5 mg by mouth every evening.      No facility-administered medications prior to visit.      Allergies:   Xarelto [rivaroxaban], Corticosteroids, Ramipril, and Benazepril   Social History   Socioeconomic History   Marital status: Married    Spouse name: Not on file   Number of children: 0   Years of education: Not on file   Highest education level: Not on file  Occupational History   Occupation: Retired Lobbyist: [employer] resource strain: Not on file   Food insecurity    Worry: Not on file    Inability: Not on file   Transportation needs    Medical: Not on file    Non-medical: Not on file  Tobacco Use   Smoking status: Never Smoker     Smokeless tobacco: Never Used  Substance and Sexual Activity   Alcohol use: Yes    Comment: Drinks 1 glass of wine nightly/socially   Drug use: No   Sexual activity: Yes  Lifestyle   Physical activity    Days per week: Not on file    Minutes per session: Not on file   Stress: Not on file  Relationships   Social connections    Talks on phone: Not on file    Gets together: Not on file    Attends religious service: Not on file    Active member of club or organization: Not on file    Attends meetings of clubs or organizations: Not on file    Relationship status: Not on file  Other Topics Concern   Not on file  Social History Narrative   Patient lives in [place] w/ his wife. He is a native of [place]. 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   Right-handed   Caffeine: 2 cups coffee per day     Family History:  The patient's family history includes Colon cancer (age of onset: 63) in his mother; Hypertension in an other family member.     ROS:   Please see the history of present illness.    ROS All other systems reviewed and are negative.   PHYSICAL EXAM:   VS:  BP (!) 162/81    Pulse 62    Ht _0  (1.803 m)    Wt 192 lb 12.8 oz (87.5 kg)    BMI 26.89 kg/m    GEN: Well nourished, well developed, in no acute distress HEENT: normal Neck: no JVD or masses Cardiac: RRR; 3/6 SEM. No rubs, or gallops. 2+ LE edema R>L Respiratory:  clear to auscultation bilaterally, normal work of breathing GI: soft, nontender, nondistended, + BS MS: no deformity or atrophy Skin: warm and dry, no rash. Right groin with dry eschar and small area of ecchymosis. No hematoma.  Neuro:  Alert and Oriented x 3, Strength and sensation are intact Psych: euthymic mood, full affect   Wt Readings from Last 3 Encounters:  05/16/19 192 lb  12.8 oz (87.5 kg)  05/11/19 191 lb 8 oz (86.9 kg)  05/04/19 201 lb 1.6 oz (91.2 kg)      Studies/Labs Reviewed:    EKG:  EKG is ordered today.  The ekg ordered today demonstrates new RBBB, 1st deg AV block, LAFB with HR 62.  Recent Labs: 10/03/2018: TSH 4.76 05/04/2019: ALT <5; B Natriuretic Peptide 1,279.6 05/09/2019: Magnesium 1.5 05/11/2019: BUN 22; Creatinine, Ser 1.05; Hemoglobin 12.5; Platelets 131; Potassium 3.7; Sodium 141   Lipid Panel    Component Value Date/Time   CHOL 144 08/01/2018 1119   TRIG 167.0 (H) 08/01/2018 1119   HDL 28.20 (L) 08/01/2018 1119   CHOLHDL 5 08/01/2018 1119   VLDL 33.4 08/01/2018 1119   LDLCALC 82 08/01/2018 1119   LDLDIRECT 151.5 11/20/2007 1623    Additional studies/ records that were reviewed today include:  TAVR OPERATIVE NOTE   Date of Procedure:05/08/2019  Preoperative Diagnosis:Severe Aortic Stenosis   Postoperative Diagnosis:Same   Procedure:   Transcatheter Aortic Valve Replacement - PercutaneousRightTransfemoral Approach EdwardsSapien 3 Ultra THV (size29m, model # 9750TFX, serial # [id number] EdwardsSapien 3 Ultra THV (size267m model # 9750TFX, serial # [id number] Co-Surgeons:Bryan K.Alveria ApleyMD and ChLauree ChandlerMD   Anesthesiologist:R. ElRoanna BanningMD  EcDala DockMD  Pre-operative Echo Findings: ? Severe aortic stenosis ? Normalleft ventricular systolic function  Post-operative Echo Findings: ? Noparavalvular leak ? Normalleft ventricular systolic function  _____________    Echo 05/09/19 IMPRESSIONS 1. - TAVR: A 26 mm S3 x 2 was placed (second valve placed due to PVL). A mild to moderate degree of prosthetic valve stenosis is present. There is no regurgitation or paravalvular leak detected. The V max is 3.6 m/s. The peak/mean gradient across the  valve is 49/29 mmHG. The LVOT VTI is estimated to be 34 cm and the LV is hyperdynamic with an intracavitary gradient ~6  mmHG. Overall, the increased gradients across the valve could be related to the ViV procedure (2 S3 placed) and the hyperdynamic state of the LV (LVOT VTI 34 cm). The doppler velocity index is 0.57 and would indicate a mild degree of stenosis. The Vmax and gradients are increased compared with yesterday's valves after the TAVR. Clinical correlation is recommended. 2. The left ventricle has hyperdynamic systolic function, with an ejection fraction of >65%. The cavity size was small. Left ventricular diastolic Doppler parameters are consistent with pseudonormalization. Elevated mean left atrial pressure No evidence of left ventricular regional wall motion abnormalities. 3. The right ventricle has normal systolic function. The cavity was normal. There is no increase in right ventricular wall thickness. 4. Left atrial size was mildly dilated. 5. Right atrial size was mildly dilated. 6. The mitral valve is grossly normal. There is mild mitral annular calcification present. 7. The tricuspid valve is grossly normal. 8. A 26 an Edwards 2 Edwards S3 valves placed due to PVL during procedure valve is present in the aortic position. Procedure Date: 05/09/2019. 9. Mild-moderate stenosis of the aortic valve. 10. The aorta is normal unless otherwise noted. 11. The aortic root is normal in size and structure. 12. When compared to the prior study: S/p TAVR. V max and gradients higher across TAVR compared with images after procedure yesterday.    ASSESSMENT & PLAN:   Severe AS s/p TAVR: doing okay. Groin sites are stable. ECG with new RBBB, 1st deg AV block, LAFB with HR 62. Continue on aspirin and Eliquis. Will discuss SBE prophylaxis at his next appointment.   HTN: BP elevated  today to 162/81. Plan to stop HCTZ. Start lasix 35m daily and start Norvasc 574mdaily.   PAF: maintaining sinus rhythm by ECG today. He had a recent recurrence of symptomatic afib with RVR prior to TAVR and had a lot of  SVT vs PAF during recent admission when we temporarily stopped his Toprol. He says HR has been steady in 60s at home. Continue Toprol XL 5070mID. Continue Eliquis for thromboembolic prophylaxis.  Acute on chronic diastolic CHF: he has evidence of volume overload with LE edema. He says HCTZ does not work well for him. Will plan to stop this now and start lasix 14m26m days followed by 20mg98mly. BMET at follow up in two weeks  New conduction disease: ECG today shows new RBBB, 1st deg AV block, LAFB with HR 62. I am hesitant to stop his Toprol XL given his recent frequent PAF with RVR. He has had no issues with weakness or syncope. Will continue to monitor closely.  Ascending aortic aneurysm: this has been stable.   Multiple Myeloma: newly diagnosed during TAVR work up and followed by Dr. Kale.Irene Limboedication Adjustments/Labs and Tests Ordered: Current medicines are reviewed at length with the patient today.  Concerns regarding medicines are outlined above.  Medication changes, Labs and Tests ordered today are listed in the Patient Instructions below. Patient Instructions  Medication Instructions:  1) STOP HCTZ (Hydrochlorothiazide)  2) START LASIX (Furosemide) 20 mg daily. Take 40 mg (2 tablets) the first two days. 3) START AMLODIPINE 5 mg daily  Follow-Up: Please keep your echo appointment scheduled 9/16 at 2:05PM. We have rearranged for your office visit after your echocardiogram on 9/16 at 3:00PM.    Signed, KathrAngelena FormC  05/16/2019 1:47 PM    [facility] [place] [id number]: [phone]: [phone]

## 2019-05-15 LAB — VON WILLEBRAND FACTOR MULTIMER

## 2019-05-16 ENCOUNTER — Encounter: Payer: Self-pay | Admitting: Physician Assistant

## 2019-05-16 ENCOUNTER — Inpatient Hospital Stay: Payer: Medicare Other | Attending: Hematology | Admitting: Hematology

## 2019-05-16 ENCOUNTER — Ambulatory Visit: Payer: Medicare Other | Admitting: Physician Assistant

## 2019-05-16 ENCOUNTER — Ambulatory Visit (INDEPENDENT_AMBULATORY_CARE_PROVIDER_SITE_OTHER): Payer: Medicare Other | Admitting: Physician Assistant

## 2019-05-16 ENCOUNTER — Other Ambulatory Visit: Payer: Self-pay

## 2019-05-16 VITALS — BP 145/84 | HR 57 | Temp 98.3°F | Resp 18 | Ht 71.0 in | Wt 192.5 lb

## 2019-05-16 VITALS — BP 162/81 | HR 62 | Ht 71.0 in | Wt 192.8 lb

## 2019-05-16 DIAGNOSIS — I480 Paroxysmal atrial fibrillation: Secondary | ICD-10-CM | POA: Diagnosis not present

## 2019-05-16 DIAGNOSIS — Z79899 Other long term (current) drug therapy: Secondary | ICD-10-CM | POA: Diagnosis not present

## 2019-05-16 DIAGNOSIS — Z7901 Long term (current) use of anticoagulants: Secondary | ICD-10-CM | POA: Insufficient documentation

## 2019-05-16 DIAGNOSIS — Z7189 Other specified counseling: Secondary | ICD-10-CM

## 2019-05-16 DIAGNOSIS — I5033 Acute on chronic diastolic (congestive) heart failure: Secondary | ICD-10-CM | POA: Diagnosis not present

## 2019-05-16 DIAGNOSIS — K760 Fatty (change of) liver, not elsewhere classified: Secondary | ICD-10-CM | POA: Insufficient documentation

## 2019-05-16 DIAGNOSIS — M1990 Unspecified osteoarthritis, unspecified site: Secondary | ICD-10-CM | POA: Insufficient documentation

## 2019-05-16 DIAGNOSIS — I350 Nonrheumatic aortic (valve) stenosis: Secondary | ICD-10-CM | POA: Diagnosis not present

## 2019-05-16 DIAGNOSIS — I110 Hypertensive heart disease with heart failure: Secondary | ICD-10-CM | POA: Diagnosis not present

## 2019-05-16 DIAGNOSIS — C9000 Multiple myeloma not having achieved remission: Secondary | ICD-10-CM | POA: Diagnosis not present

## 2019-05-16 DIAGNOSIS — I5032 Chronic diastolic (congestive) heart failure: Secondary | ICD-10-CM | POA: Diagnosis not present

## 2019-05-16 DIAGNOSIS — Z7982 Long term (current) use of aspirin: Secondary | ICD-10-CM | POA: Diagnosis not present

## 2019-05-16 DIAGNOSIS — C7951 Secondary malignant neoplasm of bone: Secondary | ICD-10-CM | POA: Diagnosis not present

## 2019-05-16 DIAGNOSIS — Z7951 Long term (current) use of inhaled steroids: Secondary | ICD-10-CM | POA: Insufficient documentation

## 2019-05-16 DIAGNOSIS — R0602 Shortness of breath: Secondary | ICD-10-CM | POA: Diagnosis not present

## 2019-05-16 DIAGNOSIS — R5383 Other fatigue: Secondary | ICD-10-CM | POA: Insufficient documentation

## 2019-05-16 DIAGNOSIS — M25559 Pain in unspecified hip: Secondary | ICD-10-CM | POA: Diagnosis not present

## 2019-05-16 DIAGNOSIS — I7121 Aneurysm of the ascending aorta, without rupture: Secondary | ICD-10-CM

## 2019-05-16 DIAGNOSIS — I712 Thoracic aortic aneurysm, without rupture: Secondary | ICD-10-CM | POA: Diagnosis not present

## 2019-05-16 DIAGNOSIS — I10 Essential (primary) hypertension: Secondary | ICD-10-CM

## 2019-05-16 DIAGNOSIS — Z5111 Encounter for antineoplastic chemotherapy: Secondary | ICD-10-CM | POA: Insufficient documentation

## 2019-05-16 DIAGNOSIS — I459 Conduction disorder, unspecified: Secondary | ICD-10-CM | POA: Diagnosis not present

## 2019-05-16 DIAGNOSIS — Z952 Presence of prosthetic heart valve: Secondary | ICD-10-CM | POA: Diagnosis not present

## 2019-05-16 MED ORDER — AMLODIPINE BESYLATE 5 MG PO TABS: 5.0000 mg | ORAL_TABLET | Freq: Every day | ORAL | 3 refills | Status: DC

## 2019-05-16 MED ORDER — FUROSEMIDE 20 MG PO TABS: 20.0000 mg | ORAL_TABLET | Freq: Every day | ORAL | 3 refills | Status: DC

## 2019-05-16 NOTE — Patient Instructions (Addendum)
Medication Instructions:  1) STOP HCTZ (Hydrochlorothiazide)  2) START LASIX (Furosemide) 20 mg daily. Take 40 mg (2 tablets) the first two days. 3) START AMLODIPINE 5 mg daily  Follow-Up: Please keep your echo appointment scheduled 9/16 at 2:05PM. We have rearranged for your office visit after your echocardiogram on 9/16 at 3:00PM.

## 2019-05-16 NOTE — Progress Notes (Signed)
HEMATOLOGY/ONCOLOGY CONSULTATION NOTE  Date of Service: 05/16/2019  Patient Care Team: Cassandria Anger, MD as PCP - General Erline Levine, MD as Attending Physician (Neurosurgery) Larey Dresser, MD (Cardiology) Jarome Matin, MD as Consulting Physician (Dermatology) Rigoberto Noel, MD as Consulting Physician (Pulmonary Disease)  CHIEF COMPLAINTS/PURPOSE OF CONSULTATION:  Newly diagnosed Plasma cell myeloma  HISTORY OF PRESENTING ILLNESS:   [name] [name] is a wonderful 78 y.o. male who has been referred to [place] by Angelena Form, PA for evaluation and management of lytic bone lesions. The pt reports that he is doing well overall.  The pt reports that he has occasional hip pain that radiates down his legs and prevents him from walking. He uses Advil, which helps his hip pain. He is used to exercising often, but has not been able to stay very physically active lately so he is gaining weight. The pt experiences some SOB when he wakes up in the morning.  The pt had a pre-procedural CTA C/A/P before a TAVR completed on 03/14/2019 which revealed "indeterminate osseus lesions in the bony pelvis," which led to an MRI and PET scan. He reports that he has pain when he pushes on his right chest.   He also notes that he had a fall while exercising in 06/2018 and thought he broke his back. He received a blood transfusion on 07/24/2018.   Of note prior to the patient's visit today, the pt has had a MRI pelvis w/wo contrast completed on 03/28/2019 with results revealing "1. Destructive bone lesions as detailed above. Findings most consistent with metastatic disease. PET-CT may be helpful for further evaluation and to establish a primary tumor. The right pelvic bone lesions should be amenable to image guided biopsy but a PET scan may demonstrate easier/safer biopsy sites. 2. No intrapelvic mass or adenopathy. 3. Benign intraosseous lipoma involving the left anterior superior acetabulum."  The pt  has also had PET whole body completed on 04/06/2019 with results revealing "1. Diffuse osseous metastatic disease as detailed above without findings for a primary neoplasm in the chest, abdomen or pelvis. The large destructive lesion involving the right ischium should be amenable to image guided biopsy. 2. Two small retroperitoneal lymph nodes and 1 small right obturator node showing hypermetabolism."   Most recent lab results (04/06/2019) of CBC is as follows: all values are WNL.  On review of systems, pt reports hip and leg pain, weight gain and denies syncope and denies belly pain, recent neuropathy and any other symptoms.   On PMHx the pt reports 5 cm hepatic flexure polyp removal, pneumonia, blood transfusion on 07/24/2018 On Social Hx the pt reports that he lives at home with his wife.  INTERVAL HISTORY:  [name] [name] is a wonderful 78 y.o. male who has who is here today for evaluation and management of lytic bone lesions. The patient's last visit with [place] was on 04/30/2019. The pt reports that he is doing well overall.  The pt reports that he had leg soreness and a fever immediately after his surgery. He is feeling no notable tightness in his chest or SOB. Pt has no history of blood clots but has been on Eliquis for his atrial fibrillation. Pt is currently taking Hydrocodone medication for his right hip. He is not currently on a Vitamin D supplement but does drink milk. Pt states that he will be receiving a repeat ECHO on 05/30/2019.  He would also like to begin exercising.    Of note since the  patient's last visit, pt has had a Transcatheter Aortic Valve Replacement, Transfemoral completed on 05/08/2019.   Lab results (05/11/19) of CBC w/diff and CMP is as follows: all values are WNL except for RBC at 4.08, Hgb at 12,5, HCT at 38.3, Platelets at 131K, Glucose at 106, Calcium at 8.6.  On review of systems, pt denies tightness in his chest, SOB, new bone pain and any other symptoms.     MEDICAL HISTORY:  Past Medical History:  Diagnosis Date  . Acute blood loss anemia   . Adenomatous colon polyp   . Ascending aortic aneurysm ([place])   . Bicuspid aortic valve   . Chest pain    ETT-myoview 12/11 w/exercise, no chest pain, no significant ST changes, EF 69%, no evidence for ischemia or infarction.  . CHF NYHA class I (no symptoms from ordinary activities), acute, diastolic ([place])   . Fatty liver    mild  . GI bleed   . GI bleeding 07/21/2018   post polypectomy  . Hemorrhoids   . HTN (hypertension)   . Hypercholesteremia   . Hypokalemia   . Internal hemorrhoids   . LBP (low back pain)   . Moderate aortic stenosis   . Osteoarthritis   . Paroxysmal atrial fibrillation ([place])    a. new onset Afib in 07/2008. He underwent ibutilide cardioversion successfully. b. Recurrence 01/2013 s/p TEE/DCCV - was on Xarelto but he stopped it as he was convinced it was causing joint pn. c. Recurrence 01/2016 - spont conv to NSR. Pt took Eliquis x1 mo then declined further anticoag. d. Recurrence 07/2016.  [name] Pneumonia   . Tubular adenoma of colon     SURGICAL HISTORY: Past Surgical History:  Procedure Laterality Date  . BACK SURGERY  x12 years ago  . CARDIOVERSION N/A 01/26/2013   Procedure: CARDIOVERSION;  Surgeon: Larey Dresser, MD;  Location: [facility] ENDOSCOPY;  Service: Cardiovascular;  Laterality: N/A;  . CARDIOVERSION N/A 10/28/2017   Procedure: CARDIOVERSION;  Surgeon: Larey Dresser, MD;  Location: [facility] ENDOSCOPY;  Service: Cardiovascular;  Laterality: N/A;  . CARDIOVERSION N/A 03/03/2018   Procedure: CARDIOVERSION;  Surgeon: Lelon Perla, MD;  Location: [facility] ENDOSCOPY;  Service: Cardiovascular;  Laterality: N/A;  . COLONOSCOPY    . COLONOSCOPY  07/17/2018   at [facility]  . HEMORRHOID SURGERY    . LUMBAR LAMINECTOMY    . POLYPECTOMY    . RIGHT/LEFT HEART CATH AND CORONARY ANGIOGRAPHY N/A 03/07/2019   Procedure: RIGHT/LEFT HEART CATH AND CORONARY ANGIOGRAPHY;  Surgeon: Burnell Blanks, MD;  Location: [place] CV LAB;  Service: Cardiovascular;  Laterality: N/A;  . TEE WITHOUT CARDIOVERSION N/A 01/26/2013   Procedure: TRANSESOPHAGEAL ECHOCARDIOGRAM (TEE);  Surgeon: Larey Dresser, MD;  Location: [place];  Service: Cardiovascular;  Laterality: N/A;  . TEE WITHOUT CARDIOVERSION N/A 10/28/2017   Procedure: TRANSESOPHAGEAL ECHOCARDIOGRAM (TEE);  Surgeon: Larey Dresser, MD;  Location: [facility] ENDOSCOPY;  Service: Cardiovascular;  Laterality: N/A;  . TEE WITHOUT CARDIOVERSION N/A 05/08/2019   Procedure: TRANSESOPHAGEAL ECHOCARDIOGRAM (TEE);  Surgeon: Burnell Blanks, MD;  Location: [place] CV LAB;  Service: Open Heart Surgery;  Laterality: N/A;  . TRANSCATHETER AORTIC VALVE REPLACEMENT, TRANSFEMORAL N/A 05/08/2019   Procedure: TRANSCATHETER AORTIC VALVE REPLACEMENT, TRANSFEMORAL;  Surgeon: Burnell Blanks, MD;  Location: [place] CV LAB;  Service: Open Heart Surgery;  Laterality: N/A;    SOCIAL HISTORY: Social History   Socioeconomic History  . Marital status: Married    Spouse name: Not on file  .  Number of children: 0  . Years of education: Not on file  . Highest education level: Not on file  Occupational History  . Occupation: Retired Lobbyist: [employer]  Social Needs  . Financial resource strain: Not on file  . Food insecurity    Worry: Not on file    Inability: Not on file  . Transportation needs    Medical: Not on file    Non-medical: Not on file  Tobacco Use  . Smoking status: Never Smoker  . Smokeless tobacco: Never Used  Substance and Sexual Activity  . Alcohol use: Yes    Comment: Drinks 1 glass of wine nightly/socially  . Drug use: No  . Sexual activity: Yes  Lifestyle  . Physical activity    Days per week: Not on file    Minutes per session: Not on file  . Stress: Not on file  Relationships  . Social Herbalist on phone: Not on file    Gets together: Not on file    Attends  religious service: Not on file    Active member of club or organization: Not on file    Attends meetings of clubs or organizations: Not on file    Relationship status: Not on file  . Intimate partner violence    Fear of current or ex partner: Not on file    Emotionally abused: Not on file    Physically abused: Not on file    Forced sexual activity: Not on file  Other Topics Concern  . Not on file  Social History Narrative   Patient lives in [place] w/ his wife. He is a native of [place]. 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   Right-handed   Caffeine: 2 cups coffee per day    FAMILY HISTORY: Family History  Problem Relation Age of Onset  . Colon cancer Mother 36  . Hypertension Other   . Coronary artery disease Neg Hx   . Colon polyps Neg Hx   . Esophageal cancer Neg Hx   . Rectal cancer Neg Hx   . Stomach cancer Neg Hx     ALLERGIES:  is allergic to xarelto [rivaroxaban]; corticosteroids; ramipril; and benazepril.  MEDICATIONS:  Current Outpatient Medications  Medication Sig Dispense Refill  . amLODipine (NORVASC) 5 MG tablet Take 1 tablet (5 mg total) by mouth daily. 90 tablet 3  . apixaban (ELIQUIS) 5 MG TABS tablet Take 1 tablet (5 mg total) by mouth 2 (two) times daily. 60 tablet 6  . aspirin 81 MG chewable tablet Chew 1 tablet (81 mg total) by mouth daily.    . famotidine (PEPCID) 40 MG tablet Take 1 tablet (40 mg total) by mouth daily. (Patient taking differently: Take 40 mg by mouth at bedtime. ) 30 tablet 11  . furosemide (LASIX) 20 MG tablet Take 1 tablet (20 mg total) by mouth daily. 90 tablet 3  . HYDROcodone-acetaminophen (NORCO/VICODIN) 5-325 MG tablet Take 1 tablet by mouth every 6 (six) hours as needed for severe pain. 60 tablet 0  . loratadine (CLARITIN) 10 MG tablet Take 1 tablet (10 mg total) by mouth daily. 30 tablet 11  . metoprolol succinate (TOPROL-XL) 50 MG 24 hr tablet TAKE 1 TABLET (50 MG TOTAL) BY MOUTH 2  (TWO) TIMES DAILY. TAKE WITH OR IMMEDIATELY FOLLOWING A MEAL. 180 tablet 3  . predniSONE (DELTASONE) 5 MG tablet Take 3 tablets (15 mg  total) by mouth daily with breakfast. 90 tablet 3  . Vitamin D, Cholecalciferol, 50 MCG (2000 UT) CAPS Take 2,000 Units by mouth daily.     No current facility-administered medications for this visit.     REVIEW OF SYSTEMS:    A 10+ POINT REVIEW OF SYSTEMS WAS OBTAINED including neurology, dermatology, psychiatry, cardiac, respiratory, lymph, extremities, GI, GU, Musculoskeletal, constitutional, breasts, reproductive, HEENT.  All pertinent positives are noted in the HPI.  All others are negative.   PHYSICAL EXAMINATION: ECOG PERFORMANCE STATUS: 2 - Symptomatic, <50% confined to bed  . Vitals:   05/16/19 1441  BP: (!) 145/84  Pulse: (!) 57  Resp: 18  Temp: 98.3 F (36.8 C)  SpO2: 100%   Filed Weights   05/16/19 1441  Weight: 192 lb 8 oz (87.3 kg)   .Body mass index is 26.85 kg/m.   GENERAL:alert, in no acute distress and comfortable SKIN: no acute rashes, no significant lesions EYES: conjunctiva are pink and non-injected, sclera anicteric OROPHARYNX: MMM, no exudates, no oropharyngeal erythema or ulceration NECK: supple, no JVD LYMPH:  no palpable lymphadenopathy in the cervical, axillary or inguinal regions LUNGS: clear to auscultation b/l with normal respiratory effort HEART: regular rate, and rhythm. 2/6 murmur with a valve click. ABDOMEN:  normoactive bowel sounds , non tender, not distended. No palpable hepatosplenomegaly.  Extremity: 1+ pedal edema PSYCH: alert & oriented x 3 with fluent speech NEURO: no focal motor/sensory deficits  LABORATORY DATA:  I have reviewed the data as listed  . CBC Latest Ref Rng & Units 05/11/2019 05/10/2019 05/10/2019  WBC 4.0 - 10.5 K/uL 7.4 8.3 8.3  Hemoglobin 13.0 - 17.0 g/dL 12.5(L) 13.7 13.6  Hematocrit 39.0 - 52.0 % 38.3(L) 41.1 41.3  Platelets 150 - 400 K/uL 131(L) 141(L) 134(L)    . CMP  Latest Ref Rng & Units 05/11/2019 05/10/2019 05/09/2019  Glucose 70 - 99 mg/dL 106(H) 106(H) -  BUN 8 - 23 mg/dL 22 21 -  Creatinine 0.61 - 1.24 mg/dL 1.05 1.23 -  Sodium 135 - 145 mmol/L 141 141 -  Potassium 3.5 - 5.1 mmol/L 3.7 4.1 3.4(L)  Chloride 98 - 111 mmol/L 106 103 -  CO2 22 - 32 mmol/L 27 26 -  Calcium 8.9 - 10.3 mg/dL 8.6(L) 8.7(L) -  Total Protein 6.5 - 8.1 g/dL - - -  Total Bilirubin 0.3 - 1.2 mg/dL - - -  Alkaline Phos 38 - 126 U/L - - -  AST 15 - 41 U/L - - -  ALT 0 - 44 U/L - - -   Component     Latest Ref Rng & Units 04/30/2019  Total Protein, Urine-UPE24     Not Estab. mg/dL   Total Protein, Urine-Ur/day     30 - 150 mg/24 hr   ALBUMIN, U     %   ALPHA 1 URINE     %   Alpha 2, Urine     %   % BETA, Urine     %   GAMMA GLOBULIN URINE     %   Free Kappa Lt Chains,Ur     0.63 - 113.79 mg/L   Free Lambda Lt Chains,Ur     0.47 - 11.77 mg/L   Free Kappa/Lambda Ratio     1.03 - 31.76   Immunofixation Result, Urine        Total Volume        M-SPIKE %, Urine     Not Observed %  M-Spike, mg/24 hr     Not Observed mg/24 hr   NOTE:        IgG (Immunoglobin G), Serum     603 - 1,613 mg/dL 1,740 (H)  IgA     61 - 437 mg/dL 16 (L)  IgM (Immunoglobulin M), Srm     15 - 143 mg/dL 9 (L)  Total Protein ELP     6.0 - 8.5 g/dL 7.3  Albumin SerPl Elph-Mcnc     2.9 - 4.4 g/dL 3.8  Alpha 1     0.0 - 0.4 g/dL 0.3  Alpha2 Glob SerPl Elph-Mcnc     0.4 - 1.0 g/dL 0.7  B-Globulin SerPl Elph-Mcnc     0.7 - 1.3 g/dL 0.9  Gamma Glob SerPl Elph-Mcnc     0.4 - 1.8 g/dL 1.6  M Protein SerPl Elph-Mcnc     Not Observed g/dL 1.5 (H)  Globulin, Total     2.2 - 3.9 g/dL 3.5  Albumin/Glob SerPl     0.7 - 1.7 1.1  IFE 1      Comment  Please Note (HCV):      Comment  Kappa free light chain     3.3 - 19.4 mg/L 821.2 (H)  Lamda free light chains     5.7 - 26.3 mg/L 1.9 (L)  Kappa, lamda light chain ratio     0.26 - 1.65 432.21 (H)  LDH     98 - 192 U/L 132  Sed  Rate     0 - 16 mm/hr 3   04/16/2019 Surgical Pathology:   RADIOGRAPHIC STUDIES: I have personally reviewed the radiological images as listed and agreed with the findings in the report. Dg Chest 2 View  Result Date: 05/05/2019 CLINICAL DATA:  Preoperative, heart valve replacement EXAM: CHEST - 2 VIEW COMPARISON:  None. FINDINGS: Cardiomegaly. Mild diffuse interstitial pulmonary opacity and trace pleural effusions and/or pleural thickening. Disc degenerative disease of the thoracic spine. IMPRESSION: Cardiomegaly. Mild diffuse interstitial pulmonary opacity and trace pleural effusions and/or pleural thickening, findings likely reflecting mild edema. No focal airspace opacity. Electronically Signed   By: Eddie Candle M.D.   On: 05/05/2019 14:28   Dg Chest Port 1 View  Result Date: 05/09/2019 CLINICAL DATA:  High fever and malaise. EXAM: PORTABLE CHEST 1 VIEW COMPARISON:  Single-view of the chest 05/08/2019. PA and lateral chest 05/04/2019. FINDINGS: There is cardiomegaly without edema. No consolidative process, pneumothorax or effusion. Atherosclerosis and aortic valve replacement noted. IMPRESSION: No acute disease. Electronically Signed   By: Inge Rise M.D.   On: 05/09/2019 15:38   Dg Chest Port 1 View  Result Date: 05/08/2019 CLINICAL DATA:  Status post TAVR EXAM: PORTABLE CHEST 1 VIEW COMPARISON:  05/04/2019 FINDINGS: Bilateral diffuse interstitial thickening. No pleural effusion or pneumothorax. Stable cardiomediastinal silhouette. Interval TAVR. No acute osseous abnormality. IMPRESSION: Interval TAVR. Bilateral interstitial thickening likely reflecting mild interstitial edema. Electronically Signed   By: Kathreen Devoid   On: 05/08/2019 14:01    ASSESSMENT & PLAN:   78 yo   #1 Newly diagnosed Plasma cell myeloma  03/28/2019 MRI pelvis w/wo contrast revealed "1. Destructive bone lesions as detailed above. Findings most consistent with metastatic disease. PET-CT may be helpful for  further evaluation and to establish a primary tumor. The right pelvic bone lesions should be amenable to image guided biopsy but a PET scan may demonstrate easier/safer biopsy sites. 2. No intrapelvic mass or adenopathy. 3. Benign intraosseous lipoma involving the left anterior superior  acetabulum."  04/06/2019 PET whole body revealed "1. Diffuse osseous metastatic disease as detailed above without findings for a primary neoplasm in the chest, abdomen or pelvis. The large destructive lesion involving the right ischium should be amenable to image guided biopsy. 2. Two small retroperitoneal lymph nodes and 1 small right obturator node showing hypermetabolism."   04/16/2019 Posterior right pelvis bone biopsy revealed "PLASMA CELL NEOPLASM"  #2 Severe aortic stenosis with bicuspid aortic valve -10/26/2018 ECHO revealed AVA at 0.8 cm2 and LV EF of 60-65% -05/08/2019 pt had a Transfemoral Transcatheter Aortic Valve Replacement  PLAN: -Discussed pt labwork today, 05/11/19;  all values are WNL except for RBC at 4.08, Hgb at 12,5, HCT at 38.3, Platelets at 131K, Glucose at 106, Calcium at 8.6.  -Discussed patient is s/p TAVR placement on 05/08/2019  -Advised pt to avoid exercises that included bending, lifting, pulling or pushing. Recommended walking to get exercise. -Discussed the CRAB criteria with the normal calcium levels and renal function, no anemia, PET scan revealed bone lesions in the ribs, spine, and pelvis.  -Will monitor K/L light chains and M Protein.  -Discussed that plasma cell myeloma is not curable, but can be treated and managed to maintain QOL. -Plan to treat pt with Dexamethasone, Velcade & Revlimid.  -Recommend pt begin taking a Vitamin D supplement & Dexamethasone as soon as possible. -Plan to put pt on preventative Acyclovir & ASA -Zometa q4weeks for bone mets -Advised pt to drink milk and avoid grape fruit and grape fruit juice.  -Advised pt to continue other medications at this  time. Will adjust if there is an interaction.  -Discussed potential side effects of treatment, including: neuropathy, increased risk of shingles, skin rashes, diarrhea, increased risk of blood clots. -Will schedule BM Bx within a week   FOLLOW UP: CT bone marrow aspiration and Biopsy in 3 -5 days Schedule to start treatment with VRd for myeloma in 7 days Chemo-counseling for VRd and zometa in 3-4 days RTC with Dr Irene Limbo with C1D4 of Velcade for toxicity check Starting Zometa q4week (starting with C1D1 of treatment)    No orders of the defined types were placed in this encounter.  The total time spent in the appt was 40 minutes and more than 50% was on counseling and direct patient cares.  All of the patient's questions were answered with apparent satisfaction. The patient knows to call the clinic with any problems, questions or concerns.    Sullivan Lone MD [place] AAHIVMS [name] Hematology/Oncology Physician [facility]  (Office):       [phone] (Work cell):  [phone] (Fax):           [phone]  05/16/2019 5:15 PM  I, Yevette Edwards, am acting as a scribe for Dr. Sullivan Lone.   .I have reviewed the above documentation for accuracy and completeness, and I agree with the above. Brunetta Genera MD

## 2019-05-17 ENCOUNTER — Telehealth: Payer: Self-pay | Admitting: Hematology

## 2019-05-17 DIAGNOSIS — C7951 Secondary malignant neoplasm of bone: Secondary | ICD-10-CM | POA: Insufficient documentation

## 2019-05-17 DIAGNOSIS — Z7189 Other specified counseling: Secondary | ICD-10-CM | POA: Insufficient documentation

## 2019-05-17 DIAGNOSIS — C9000 Multiple myeloma not having achieved remission: Secondary | ICD-10-CM | POA: Insufficient documentation

## 2019-05-17 MED ORDER — ACYCLOVIR 400 MG PO TABS: 400.0000 mg | ORAL_TABLET | Freq: Two times a day (BID) | ORAL | 3 refills | Status: DC

## 2019-05-17 MED ORDER — LENALIDOMIDE 15 MG PO CAPS: 15.0000 mg | ORAL_CAPSULE | Freq: Every day | ORAL | 0 refills | Status: DC

## 2019-05-17 MED ORDER — LORAZEPAM 0.5 MG PO TABS: 0.5000 mg | ORAL_TABLET | Freq: Four times a day (QID) | ORAL | 0 refills | Status: DC | PRN

## 2019-05-17 MED ORDER — ONDANSETRON HCL 8 MG PO TABS: 8.0000 mg | ORAL_TABLET | Freq: Two times a day (BID) | ORAL | 1 refills | Status: DC | PRN

## 2019-05-17 MED ORDER — DEXAMETHASONE 4 MG PO TABS: 20.0000 mg | ORAL_TABLET | ORAL | 6 refills | Status: DC

## 2019-05-17 MED ORDER — PROCHLORPERAZINE MALEATE 10 MG PO TABS: 10.0000 mg | ORAL_TABLET | Freq: Four times a day (QID) | ORAL | 1 refills | Status: DC | PRN

## 2019-05-17 NOTE — Telephone Encounter (Signed)
Scheduled appt per 9/2 los.  Melissa D. Spoke with Threasa Beards and she okayed with start date for the velcade for 9/8.  Spoke with patient and he is aware of his scheduled appt dates and time.

## 2019-05-17 NOTE — Progress Notes (Signed)
START ON PATHWAY REGIMEN - Multiple Myeloma and Other Plasma Cell Dyscrasias     A cycle is every 21 days:     Bortezomib      Lenalidomide      Dexamethasone   **Always confirm dose/schedule in your pharmacy ordering system**  Patient Characteristics: Newly Diagnosed, Transplant Eligible, Unknown or Awaiting Test Results R-ISS Staging: Unknown Disease Classification: Newly Diagnosed Is Patient Eligible for Transplant<= Transplant Eligible Risk Status: Awaiting Test Results Intent of Therapy: Non-Curative / Palliative Intent, Discussed with Patient 

## 2019-05-17 NOTE — Telephone Encounter (Signed)
Per 9/2 los scheduled appts.

## 2019-05-18 ENCOUNTER — Encounter: Payer: Self-pay | Admitting: Hematology

## 2019-05-18 ENCOUNTER — Telehealth: Payer: Self-pay | Admitting: Pharmacist

## 2019-05-18 ENCOUNTER — Other Ambulatory Visit: Payer: Self-pay

## 2019-05-18 ENCOUNTER — Inpatient Hospital Stay: Payer: Medicare Other

## 2019-05-18 ENCOUNTER — Telehealth: Payer: Self-pay | Admitting: *Deleted

## 2019-05-18 DIAGNOSIS — Z7189 Other specified counseling: Secondary | ICD-10-CM

## 2019-05-18 DIAGNOSIS — C9000 Multiple myeloma not having achieved remission: Secondary | ICD-10-CM

## 2019-05-18 MED ORDER — LENALIDOMIDE 15 MG PO CAPS: 15.0000 mg | ORAL_CAPSULE | Freq: Every day | ORAL | 0 refills | Status: DC

## 2019-05-18 NOTE — Telephone Encounter (Signed)
Oral Oncology Pharmacist Encounter  Received new prescription for Revlimid (lenalidomide) for the treatment of multiple myeloma with muttiple lytic bone lesions, not having achieved remission in conjunction with bortezomib and dexamethasone, planned duration usually 4-6 cycles for induction therapy.  Labs from 05/10/19 and 8/28 assessed, OK for treatment intiation. No evidence of hepatic dysfunction on 05/04/19 CMET  Noted platelet count slightly thrombocytopenic at 131k, will be monitored closely during treatment  SCr=1.27, est CrCl ~ 60 mL/min, manufacturer recommends dose reduction to 10 mg once daily for cycle 1, can increase to 15 mg daily pending toleration and blood counts for subsequent cycles MD has prescribed 15 mg once daily for 14 days on, 7 days off, repeated every 21 days, based on therapeutic expertise  Current medication list in Epic reviewed  Noted apixaban 87m BID on medication and patient now has been prescribed aspirin 81 mg daily. Will discuss if patient remains on apixaban and update MD for appropriate plan for thromboprophyaxis.  Noted patient with prescriptions for prednisone 15 mg daily and dexamethasone 20 mg once weekly. Will discuss with patient if he has discontinued prednisone and follow-up with MD for appropriate steroid plan.  Prescription for Revlimid will be e-scribed to appropriate specialty pharmacy for dispensing once insurance authorization is approved. [facility]. [employer] authorization# has already been obtained by cVisual merchandiser #[id number] obtained on 05/18/19  Prescriptions for acyclovir 400 mg BID for VZV prophylaxis, dexamethasone 20 mg once weekly, and ondansetron and [facility]chlorperazine [facility]  Oral Oncology Clinic will continue to follow for insurance authorization, copayment issues, initial counseling  and start date.  JJohny Drilling PharmD, BCPS, BCOP  05/18/2019 1:41 PM Oral Oncology Clinic [phone]

## 2019-05-18 NOTE — Telephone Encounter (Signed)
Contacted patient to complete [employer]'s  REVLIMID (lenalidomide) Patient-Physician Agreement Form online. Patient verbalized understanding of questions presented, provided answers and form completed online.  [employer] authorization number obtained and added to prescription - notification sent to Oral Chemo Pharmacist. Patient asked about which medicine to start now - Per Dr. Irene Limbo, start Dexamethasone (first dose today) and can start Acyclovir today, patient verbalized understanding.  Scheduled for [facility] Patient Education today  - was advised to bring all mew medications with him. Patient verbalized understanding.

## 2019-05-18 NOTE — Progress Notes (Signed)
Met with patient at check in to introduce myself as Arboriculturist and to offer available resources.  Patient has 2 insurances therefore copay assistance will not be needed.  Discussed one-time $33 Engineer, drilling to assist with personal expenses while going through treatment.  Gave him my card if interested in applying and for any additional financial questions or concerns. He verbalized understanding.

## 2019-05-19 ENCOUNTER — Other Ambulatory Visit: Payer: Self-pay | Admitting: Internal Medicine

## 2019-05-22 ENCOUNTER — Telehealth: Payer: Self-pay

## 2019-05-22 ENCOUNTER — Inpatient Hospital Stay: Payer: Medicare Other

## 2019-05-22 ENCOUNTER — Other Ambulatory Visit: Payer: Self-pay

## 2019-05-22 VITALS — BP 151/78 | HR 54 | Temp 98.0°F | Resp 18

## 2019-05-22 DIAGNOSIS — R0602 Shortness of breath: Secondary | ICD-10-CM | POA: Diagnosis not present

## 2019-05-22 DIAGNOSIS — C9000 Multiple myeloma not having achieved remission: Secondary | ICD-10-CM

## 2019-05-22 DIAGNOSIS — C7951 Secondary malignant neoplasm of bone: Secondary | ICD-10-CM

## 2019-05-22 DIAGNOSIS — Z5111 Encounter for antineoplastic chemotherapy: Secondary | ICD-10-CM | POA: Diagnosis not present

## 2019-05-22 DIAGNOSIS — M25559 Pain in unspecified hip: Secondary | ICD-10-CM | POA: Diagnosis not present

## 2019-05-22 DIAGNOSIS — R5383 Other fatigue: Secondary | ICD-10-CM | POA: Diagnosis not present

## 2019-05-22 DIAGNOSIS — Z7189 Other specified counseling: Secondary | ICD-10-CM

## 2019-05-22 DIAGNOSIS — I480 Paroxysmal atrial fibrillation: Secondary | ICD-10-CM | POA: Diagnosis not present

## 2019-05-22 MED ORDER — LENALIDOMIDE 15 MG PO CAPS: 15.0000 mg | ORAL_CAPSULE | Freq: Every day | ORAL | 0 refills | Status: DC

## 2019-05-22 MED ORDER — PROCHLORPERAZINE MALEATE 10 MG PO TABS
ORAL_TABLET | ORAL | Status: AC
  Filled 2019-05-22: qty 1

## 2019-05-22 MED ORDER — PROCHLORPERAZINE MALEATE 10 MG PO TABS
10.0000 mg | ORAL_TABLET | Freq: Once | ORAL | Status: AC
  Administered 2019-05-22: 10 mg via ORAL

## 2019-05-22 MED ORDER — ZOLEDRONIC ACID 4 MG/100ML IV SOLN
4.0000 mg | Freq: Once | INTRAVENOUS | Status: AC
  Administered 2019-05-22: 4 mg via INTRAVENOUS
  Filled 2019-05-22: qty 100

## 2019-05-22 MED ORDER — BORTEZOMIB CHEMO SQ INJECTION 3.5 MG (2.5MG/ML)
1.3000 mg/m2 | Freq: Once | INTRAMUSCULAR | Status: AC
  Administered 2019-05-22: 2.75 mg via SUBCUTANEOUS
  Filled 2019-05-22: qty 1.1

## 2019-05-22 MED ORDER — SODIUM CHLORIDE 0.9 % IV SOLN
Freq: Once | INTRAVENOUS | Status: AC
  Administered 2019-05-22: 10:00:00 via INTRAVENOUS
  Filled 2019-05-22: qty 250

## 2019-05-22 NOTE — Progress Notes (Signed)
Per Dr. Irene Limbo - Verbal order -  ok to provide treatment today with lab values from 05/11/2019

## 2019-05-22 NOTE — Progress Notes (Signed)
Per Fransisco Hertz, RN.  Dr Irene Limbo ok to give zometa with ca = 8.6 and will educate pt to start oral calcium.

## 2019-05-22 NOTE — Telephone Encounter (Signed)
Oral Oncology Patient Advocate Encounter  Revlimid copay is [phone], this is not affordable for the patient.  The patient does not qualify for a grant due to income.  The patient agreed to apply for patient assistance with [employer].  The patient will sign the application and bring in his 2019 tax return on Friday, 9/11.  Oral oncology clinic will continue to follow.  [place] Patient [place] Phone [phone] Fax [phone] 05/22/2019   3:59 PM

## 2019-05-22 NOTE — Telephone Encounter (Signed)
Oral Oncology Patient Advocate Encounter  Prior Authorization for Revlimid has been approved.    PA# [id number] Effective dates: 02/21/19 through 05/21/22  Oral Oncology Clinic will continue to follow.   [name] [name] Patient [place] Phone [phone] Fax [phone] 05/22/2019    10:49 AM

## 2019-05-22 NOTE — Telephone Encounter (Signed)
Oral Oncology Patient Advocate Encounter  Received notification from [facility] [employer] that prior authorization for Revlimid is required.  PA submitted on CoverMyMeds Key [id number] Status is pending  Oral Oncology Clinic will continue to follow.  [place] Patient [place] Phone [phone] Fax [phone] 05/22/2019    10:45 AM

## 2019-05-22 NOTE — Telephone Encounter (Signed)
Oral Oncology Pharmacist Encounter  Received notification that insurance authorization for Revlimid (lenalidomide) has been approved. Revlimid is a limited distribution medication and is not available for dispensing at the [facility].  Revlimid prescription has been e-scribed to [facility]. Supporting information including insurance information, demographic information, medication list, and insurance authorization has been faxed to dispensing pharmacy.   I will reach out to the patient to discuss initial counseling and site of dispensing once I confirm pharmacy is able to process patient's claim.  Johny Drilling, PharmD, BCPS, BCOP  05/22/2019 11:16 AM Oral Oncology Clinic [phone]

## 2019-05-22 NOTE — Progress Notes (Signed)
Calcium 8.6 on 05/11/19, ok to treat per Dr. Irene Limbo.  Per Margo Aye, RN and patient - pt took dexamethasone on Friday in preparation for Velcade injection today.  Pt also takes prednisone daily for a different medical dx.

## 2019-05-22 NOTE — Patient Instructions (Signed)
[facility] Discharge Instructions for Patients Receiving Chemotherapy  Begin taking Calcium 1000-1200 mg daily.  Today you received the following chemotherapy agents Velcade and Zometa  To help prevent nausea and vomiting after your treatment, we encourage you to take your nausea medication as prescribed.   If you develop nausea and vomiting that is not controlled by your nausea medication, call the clinic.   BELOW ARE SYMPTOMS THAT SHOULD BE REPORTED IMMEDIATELY:  *FEVER GREATER THAN 100.5 F  *CHILLS WITH OR WITHOUT FEVER  NAUSEA AND VOMITING THAT IS NOT CONTROLLED WITH YOUR NAUSEA MEDICATION  *UNUSUAL SHORTNESS OF BREATH  *UNUSUAL BRUISING OR BLEEDING  TENDERNESS IN MOUTH AND THROAT WITH OR WITHOUT PRESENCE OF ULCERS  *URINARY PROBLEMS  *BOWEL PROBLEMS  UNUSUAL RASH Items with * indicate a potential emergency and should be followed up as soon as possible.  Feel free to call the clinic should you have any questions or concerns. The clinic phone number is [phone].  Please show the [place] at check-in to the Emergency Department and triage nurse.  Bortezomib injection What is this medicine? BORTEZOMIB (bor TEZ oh mib) is a medicine that targets proteins in cancer cells and stops the cancer cells from growing. It is used to treat multiple myeloma and mantle-cell lymphoma. This medicine may be used for other purposes; ask your health care provider or pharmacist if you have questions. COMMON BRAND NAME(S): Velcade What should I tell my health care provider before I take this medicine? They need to know if you have any of these conditions:  diabetes  heart disease  irregular heartbeat  liver disease  on hemodialysis  low blood counts, like low white blood cells, platelets, or hemoglobin  peripheral neuropathy  taking medicine for blood pressure  an unusual or allergic reaction to bortezomib, mannitol, boron, other medicines, foods,  dyes, or preservatives  pregnant or trying to get pregnant  breast-feeding How should I use this medicine? This medicine is for injection into a vein or for injection under the skin. It is given by a health care professional in a hospital or clinic setting. Talk to your pediatrician regarding the use of this medicine in children. Special care may be needed. Overdosage: If you think you have taken too much of this medicine contact a poison control center or emergency room at once. NOTE: This medicine is only for you. Do not share this medicine with others. What if I miss a dose? It is important not to miss your dose. Call your doctor or health care professional if you are unable to keep an appointment. What may interact with this medicine? This medicine may interact with the following medications:  ketoconazole  rifampin  ritonavir  St. John's Wort This list may not describe all possible interactions. Give your health care provider a list of all the medicines, herbs, non-prescription drugs, or dietary supplements you use. Also tell them if you smoke, drink alcohol, or use illegal drugs. Some items may interact with your medicine. What should I watch for while using this medicine? You may get drowsy or dizzy. Do not drive, use machinery, or do anything that needs mental alertness until you know how this medicine affects you. Do not stand or sit up quickly, especially if you are an older patient. This reduces the risk of dizzy or fainting spells. In some cases, you may be given additional medicines to help with side effects. Follow all directions for their use. Call your doctor or health  care professional for advice if you get a fever, chills or sore throat, or other symptoms of a cold or flu. Do not treat yourself. This drug decreases your body's ability to fight infections. Try to avoid being around people who are sick. This medicine may increase your risk to bruise or bleed. Call your  doctor or health care professional if you notice any unusual bleeding. You may need blood work done while you are taking this medicine. In some patients, this medicine may cause a serious brain infection that may cause death. If you have any problems seeing, thinking, speaking, walking, or standing, tell your doctor right away. If you cannot reach your doctor, urgently seek other source of medical care. Check with your doctor or health care professional if you get an attack of severe diarrhea, nausea and vomiting, or if you sweat a lot. The loss of too much body fluid can make it dangerous for you to take this medicine. Do not become pregnant while taking this medicine or for at least 7 months after stopping it. Women should inform their doctor if they wish to become pregnant or think they might be pregnant. Men should not father a child while taking this medicine and for at least 4 months after stopping it. There is a potential for serious side effects to an unborn child. Talk to your health care professional or pharmacist for more information. Do not breast-feed an infant while taking this medicine or for 2 months after stopping it. This medicine may interfere with the ability to have a child. You should talk with your doctor or health care professional if you are concerned about your fertility. What side effects may I notice from receiving this medicine? Side effects that you should report to your doctor or health care professional as soon as possible:  allergic reactions like skin rash, itching or hives, swelling of the face, lips, or tongue  breathing problems  changes in hearing  changes in vision  fast, irregular heartbeat  feeling faint or lightheaded, falls  pain, tingling, numbness in the hands or feet  right upper belly pain  seizures  swelling of the ankles, feet, hands  unusual bleeding or bruising  unusually weak or tired  vomiting  yellowing of the eyes or skin Side  effects that usually do not require medical attention (report to your doctor or health care professional if they continue or are bothersome):  changes in emotions or moods  constipation  diarrhea  loss of appetite  headache  irritation at site where injected  nausea This list may not describe all possible side effects. Call your doctor for medical advice about side effects. You may report side effects to FDA at 1-800-FDA-1088. Where should I keep my medicine? This drug is given in a hospital or clinic and will not be stored at home. NOTE: This sheet is a summary. It may not cover all possible information. If you have questions about this medicine, talk to your doctor, pharmacist, or health care provider.  2020 Elsevier/Gold Standard (2018-01-09 16:29:31)  Zoledronic Acid injection (Hypercalcemia, Oncology) What is this medicine? ZOLEDRONIC ACID (ZOE le dron ik AS id) lowers the amount of calcium loss from bone. It is used to treat too much calcium in your blood from cancer. It is also used to prevent complications of cancer that has spread to the bone. This medicine may be used for other purposes; ask your health care provider or pharmacist if you have questions. COMMON BRAND NAME(S): Zometa  What should I tell my health care provider before I take this medicine? They need to know if you have any of these conditions:  aspirin-sensitive asthma  cancer, especially if you are receiving medicines used to treat cancer  dental disease or wear dentures  infection  kidney disease  receiving corticosteroids like dexamethasone or prednisone  an unusual or allergic reaction to zoledronic acid, other medicines, foods, dyes, or preservatives  pregnant or trying to get pregnant  breast-feeding How should I use this medicine? This medicine is for infusion into a vein. It is given by a health care professional in a hospital or clinic setting. Talk to your pediatrician regarding the  use of this medicine in children. Special care may be needed. Overdosage: If you think you have taken too much of this medicine contact a poison control center or emergency room at once. NOTE: This medicine is only for you. Do not share this medicine with others. What if I miss a dose? It is important not to miss your dose. Call your doctor or health care professional if you are unable to keep an appointment. What may interact with this medicine?  certain antibiotics given by injection  NSAIDs, medicines for pain and inflammation, like ibuprofen or naproxen  some diuretics like bumetanide, furosemide  teriparatide  thalidomide This list may not describe all possible interactions. Give your health care provider a list of all the medicines, herbs, non-prescription drugs, or dietary supplements you use. Also tell them if you smoke, drink alcohol, or use illegal drugs. Some items may interact with your medicine. What should I watch for while using this medicine? Visit your doctor or health care professional for regular checkups. It may be some time before you see the benefit from this medicine. Do not stop taking your medicine unless your doctor tells you to. Your doctor may order blood tests or other tests to see how you are doing. Women should inform their doctor if they wish to become pregnant or think they might be pregnant. There is a potential for serious side effects to an unborn child. Talk to your health care professional or pharmacist for more information. You should make sure that you get enough calcium and vitamin D while you are taking this medicine. Discuss the foods you eat and the vitamins you take with your health care professional. Some people who take this medicine have severe bone, joint, and/or muscle pain. This medicine may also increase your risk for jaw problems or a broken thigh bone. Tell your doctor right away if you have severe pain in your jaw, bones, joints, or muscles.  Tell your doctor if you have any pain that does not go away or that gets worse. Tell your dentist and dental surgeon that you are taking this medicine. You should not have major dental surgery while on this medicine. See your dentist to have a dental exam and fix any dental problems before starting this medicine. Take good care of your teeth while on this medicine. Make sure you see your dentist for regular follow-up appointments. What side effects may I notice from receiving this medicine? Side effects that you should report to your doctor or health care professional as soon as possible:  allergic reactions like skin rash, itching or hives, swelling of the face, lips, or tongue  anxiety, confusion, or depression  breathing problems  changes in vision  eye pain  feeling faint or lightheaded, falls  jaw pain, especially after dental work  mouth sores  muscle cramps, stiffness, or weakness  redness, blistering, peeling or loosening of the skin, including inside the mouth  trouble passing urine or change in the amount of urine Side effects that usually do not require medical attention (report to your doctor or health care professional if they continue or are bothersome):  bone, joint, or muscle pain  constipation  diarrhea  fever  hair loss  irritation at site where injected  loss of appetite  nausea, vomiting  stomach upset  trouble sleeping  trouble swallowing  weak or tired This list may not describe all possible side effects. Call your doctor for medical advice about side effects. You may report side effects to FDA at 1-800-FDA-1088. Where should I keep my medicine? This drug is given in a hospital or clinic and will not be stored at home. NOTE: This sheet is a summary. It may not cover all possible information. If you have questions about this medicine, talk to your doctor, pharmacist, or health care provider.  2020 Elsevier/Gold Standard (2014-01-26  14:19:39)  Coronavirus (COVID-19) Are you at risk?  Are you at risk for the Coronavirus (COVID-19)?  To be considered HIGH RISK for Coronavirus (COVID-19), you have to meet the following criteria:  . Traveled to [place], [place], [place], [place] or [place]; or in the [place] to [employer], [place], [place], or [place]; and have fever, cough, and shortness of breath within the last 2 weeks of travel OR . Been in close contact with a person diagnosed with COVID-19 within the last 2 weeks and have fever, cough, and shortness of breath . IF YOU DO NOT MEET THESE CRITERIA, YOU ARE CONSIDERED LOW RISK FOR COVID-19.  What to do if you are HIGH RISK for COVID-19?  [name] If you are having a medical emergency, call 911. . Seek medical care right away. Before you go to a doctor's office, urgent care or emergency department, call ahead and tell them about your recent travel, contact with someone diagnosed with COVID-19, and your symptoms. You should receive instructions from your physician's office regarding next steps of care.  . When you arrive at healthcare provider, tell the healthcare staff immediately you have returned from visiting [place], [place], [place], [place] or [place]; or traveled in the [place] to [place], [place], [place], or [place]; in the last two weeks or you have been in close contact with a person diagnosed with COVID-19 in the last 2 weeks.   . Tell the health care staff about your symptoms: fever, cough and shortness of breath. . After you have been seen by a medical provider, you will be either: o Tested for (COVID-19) and discharged home on quarantine except to seek medical care if symptoms worsen, and asked to  - Stay home and avoid contact with others until you get your results (4-5 days)  - Avoid travel on public transportation if possible (such as bus, train, or airplane) or o Sent to the Emergency Department by EMS for evaluation, COVID-19 testing, and  possible admission depending on your condition and test results.  What to do if you are LOW RISK for COVID-19?  Reduce your risk of any infection by using the same precautions used for avoiding the common cold or flu:  [name] Wash your hands often with soap and warm water for at least 20 seconds.  If soap and water are not readily available, use an alcohol-based hand sanitizer with at least 60% alcohol.  . If coughing  or sneezing, cover your mouth and nose by coughing or sneezing into the elbow areas of your shirt or coat, into a tissue or into your sleeve (not your hands). . Avoid shaking hands with others and consider head nods or verbal greetings only. . Avoid touching your eyes, nose, or mouth with unwashed hands.  . Avoid close contact with people who are sick. . Avoid places or events with large numbers of people in one location, like concerts or sporting events. . Carefully consider travel plans you have or are making. . If you are planning any travel outside or inside the [place], visit the CDC's Travelers' Health webpage for the latest health notices. . If you have some symptoms but not all symptoms, continue to monitor at home and seek medical attention if your symptoms worsen. . If you are having a medical emergency, call 911.   [place] / e-Visit: [email or web address]         [facility]: [facility]: [phone]                   [facility]: [phone]

## 2019-05-23 ENCOUNTER — Telehealth: Payer: Self-pay | Admitting: *Deleted

## 2019-05-23 ENCOUNTER — Other Ambulatory Visit: Payer: Self-pay | Admitting: Student

## 2019-05-24 ENCOUNTER — Ambulatory Visit (HOSPITAL_COMMUNITY)
Admission: RE | Admit: 2019-05-24 | Discharge: 2019-05-24 | Disposition: A | Discharge status: Home / Self Care | Payer: Medicare Other | Source: Ambulatory Visit | Attending: Hematology | Admitting: Hematology

## 2019-05-24 ENCOUNTER — Other Ambulatory Visit: Payer: Self-pay | Admitting: *Deleted

## 2019-05-24 ENCOUNTER — Other Ambulatory Visit: Payer: Self-pay

## 2019-05-24 ENCOUNTER — Encounter (HOSPITAL_COMMUNITY): Payer: Self-pay

## 2019-05-24 DIAGNOSIS — Z7901 Long term (current) use of anticoagulants: Secondary | ICD-10-CM | POA: Diagnosis not present

## 2019-05-24 DIAGNOSIS — M545 Low back pain: Secondary | ICD-10-CM | POA: Insufficient documentation

## 2019-05-24 DIAGNOSIS — I10 Essential (primary) hypertension: Secondary | ICD-10-CM | POA: Insufficient documentation

## 2019-05-24 DIAGNOSIS — I480 Paroxysmal atrial fibrillation: Secondary | ICD-10-CM | POA: Insufficient documentation

## 2019-05-24 DIAGNOSIS — K760 Fatty (change of) liver, not elsewhere classified: Secondary | ICD-10-CM | POA: Insufficient documentation

## 2019-05-24 DIAGNOSIS — Z7982 Long term (current) use of aspirin: Secondary | ICD-10-CM | POA: Diagnosis not present

## 2019-05-24 DIAGNOSIS — M1990 Unspecified osteoarthritis, unspecified site: Secondary | ICD-10-CM | POA: Diagnosis not present

## 2019-05-24 DIAGNOSIS — Z79899 Other long term (current) drug therapy: Secondary | ICD-10-CM | POA: Diagnosis not present

## 2019-05-24 DIAGNOSIS — E785 Hyperlipidemia, unspecified: Secondary | ICD-10-CM | POA: Insufficient documentation

## 2019-05-24 DIAGNOSIS — Q231 Congenital insufficiency of aortic valve: Secondary | ICD-10-CM | POA: Insufficient documentation

## 2019-05-24 DIAGNOSIS — C9000 Multiple myeloma not having achieved remission: Secondary | ICD-10-CM

## 2019-05-24 DIAGNOSIS — D62 Acute posthemorrhagic anemia: Secondary | ICD-10-CM | POA: Insufficient documentation

## 2019-05-24 DIAGNOSIS — Z952 Presence of prosthetic heart valve: Secondary | ICD-10-CM | POA: Insufficient documentation

## 2019-05-24 DIAGNOSIS — D4989 Neoplasm of unspecified behavior of other specified sites: Secondary | ICD-10-CM | POA: Diagnosis not present

## 2019-05-24 LAB — CBC WITH DIFFERENTIAL/PLATELET
Abs Immature Granulocytes: 0.16 10*3/uL — ABNORMAL HIGH (ref 0.00–0.07)
Basophils Absolute: 0 10*3/uL (ref 0.0–0.1)
Basophils Relative: 1 %
Eosinophils Absolute: 0 10*3/uL (ref 0.0–0.5)
Eosinophils Relative: 0 %
HCT: 44.5 % (ref 39.0–52.0)
Hemoglobin: 14.3 g/dL (ref 13.0–17.0)
Immature Granulocytes: 2 %
Lymphocytes Relative: 17 %
Lymphs Abs: 1.5 10*3/uL (ref 0.7–4.0)
MCH: 30.6 pg (ref 26.0–34.0)
MCHC: 32.1 g/dL (ref 30.0–36.0)
MCV: 95.3 fL (ref 80.0–100.0)
Monocytes Absolute: 1.2 10*3/uL — ABNORMAL HIGH (ref 0.1–1.0)
Monocytes Relative: 13 %
Neutro Abs: 5.8 10*3/uL (ref 1.7–7.7)
Neutrophils Relative %: 67 %
Platelets: 229 10*3/uL (ref 150–400)
RBC: 4.67 MIL/uL (ref 4.22–5.81)
RDW: 13.1 % (ref 11.5–15.5)
WBC: 8.7 10*3/uL (ref 4.0–10.5)
nRBC: 0 % (ref 0.0–0.2)

## 2019-05-24 LAB — PROTIME-INR
INR: 1.1 (ref 0.8–1.2)
Prothrombin Time: 13.6 seconds (ref 11.4–15.2)

## 2019-05-24 LAB — APTT: aPTT: 27 seconds (ref 24–36)

## 2019-05-24 MED ORDER — SODIUM CHLORIDE 0.9 % IV SOLN
INTRAVENOUS | Status: DC
  Administered 2019-05-24: 08:00:00 via INTRAVENOUS

## 2019-05-24 MED ORDER — FENTANYL CITRATE (PF) 100 MCG/2ML IJ SOLN
INTRAMUSCULAR | Status: AC
  Filled 2019-05-24: qty 2

## 2019-05-24 MED ORDER — FENTANYL CITRATE (PF) 100 MCG/2ML IJ SOLN
INTRAMUSCULAR | Status: AC | PRN
  Administered 2019-05-24: 50 ug via INTRAVENOUS

## 2019-05-24 MED ORDER — MIDAZOLAM HCL 2 MG/2ML IJ SOLN
INTRAMUSCULAR | Status: AC | PRN
  Administered 2019-05-24 (×2): 1 mg via INTRAVENOUS

## 2019-05-24 MED ORDER — MIDAZOLAM HCL 2 MG/2ML IJ SOLN
INTRAMUSCULAR | Status: AC
  Filled 2019-05-24: qty 4

## 2019-05-24 MED ORDER — FLUMAZENIL 0.5 MG/5ML IV SOLN
INTRAVENOUS | Status: AC
  Filled 2019-05-24: qty 5

## 2019-05-24 MED ORDER — LIDOCAINE HCL (PF) 1 % IJ SOLN
INTRAMUSCULAR | Status: AC | PRN
  Administered 2019-05-24: 10 mL

## 2019-05-24 MED ORDER — NALOXONE HCL 0.4 MG/ML IJ SOLN
INTRAMUSCULAR | Status: AC
  Filled 2019-05-24: qty 1

## 2019-05-24 NOTE — Procedures (Signed)
Interventional Radiology Procedure:   Indications: Multiple myeloma  Procedure: CT guided bone marrow biopsy  Findings: 2 aspirates and 1 core from right ilium  Complications: None     EBL: Minimal, less than 10 ml  Plan: Discharge to home in one hour.   Aislin Onofre R. Jomarie Gellis, MD  Pager: [phone]   

## 2019-05-24 NOTE — H&P (Signed)
Chief Complaint: Patient was seen in consultation today for multiple myeloma/bone marrow biopsy and aspiration.  Referring Physician(s): Brunetta Genera  Supervising Physician: Markus Daft  Patient Status: [facility] - Out-pt  History of Present Illness: [name] [name] is a 78 y.o. male with a past medical history of hypertension, hyperlipidemia, paroxysmal atrial fibrillation on chronic anticoagulation with Eliquis, bicuspid aortic valve s/p heart valve replacement, HF, GI bleed, tubular adenoma of colon, hemorrhoids, fatty liver disease, anemia, multiple myeloma, OA, and low back pain. He was unfortunately diagnosed with multiple myeloma in 04/2019. His cancer is managed by Dr. Irene Limbo. He has tentative plans to begin systemic chemotherapy for management.  IR consulted by Dr. Irene Limbo for possible image-guided bone marrow biopsy/aspiration prior to initiation of systemic chemotherapy to assess response to treatment. Patient awake and alert laying in bed with no complaints at this time. Denies fever, chills, chest pain, dyspnea, abdominal pain, or headache.  Currently taking Eliquis 5 mg twice daily, last dose 05/23/2019.   Past Medical History:  Diagnosis Date  . Acute blood loss anemia   . Adenomatous colon polyp   . Ascending aortic aneurysm ([place])   . Bicuspid aortic valve   . Chest pain    ETT-myoview 12/11 w/exercise, no chest pain, no significant ST changes, EF 69%, no evidence for ischemia or infarction.  . CHF NYHA class I (no symptoms from ordinary activities), acute, diastolic ([place])   . Fatty liver    mild  . GI bleed   . GI bleeding 07/21/2018   post polypectomy  . Hemorrhoids   . HTN (hypertension)   . Hypercholesteremia   . Hypokalemia   . Internal hemorrhoids   . LBP (low back pain)   . Moderate aortic stenosis   . Osteoarthritis   . Paroxysmal atrial fibrillation ([place])    a. new onset Afib in 07/2008. He underwent ibutilide cardioversion successfully. b. Recurrence  01/2013 s/p TEE/DCCV - was on Xarelto but he stopped it as he was convinced it was causing joint pn. c. Recurrence 01/2016 - spont conv to NSR. Pt took Eliquis x1 mo then declined further anticoag. d. Recurrence 07/2016.  [name] Pneumonia   . Tubular adenoma of colon     Past Surgical History:  Procedure Laterality Date  . BACK SURGERY  x12 years ago  . CARDIOVERSION N/A 01/26/2013   Procedure: CARDIOVERSION;  Surgeon: Larey Dresser, MD;  Location: [facility] ENDOSCOPY;  Service: Cardiovascular;  Laterality: N/A;  . CARDIOVERSION N/A 10/28/2017   Procedure: CARDIOVERSION;  Surgeon: Larey Dresser, MD;  Location: [facility] ENDOSCOPY;  Service: Cardiovascular;  Laterality: N/A;  . CARDIOVERSION N/A 03/03/2018   Procedure: CARDIOVERSION;  Surgeon: Lelon Perla, MD;  Location: [facility] ENDOSCOPY;  Service: Cardiovascular;  Laterality: N/A;  . COLONOSCOPY    . COLONOSCOPY  07/17/2018   at [facility]  . HEMORRHOID SURGERY    . LUMBAR LAMINECTOMY    . POLYPECTOMY    . RIGHT/LEFT HEART CATH AND CORONARY ANGIOGRAPHY N/A 03/07/2019   Procedure: RIGHT/LEFT HEART CATH AND CORONARY ANGIOGRAPHY;  Surgeon: Burnell Blanks, MD;  Location: [place] CV LAB;  Service: Cardiovascular;  Laterality: N/A;  . TEE WITHOUT CARDIOVERSION N/A 01/26/2013   Procedure: TRANSESOPHAGEAL ECHOCARDIOGRAM (TEE);  Surgeon: Larey Dresser, MD;  Location: [place];  Service: Cardiovascular;  Laterality: N/A;  . TEE WITHOUT CARDIOVERSION N/A 10/28/2017   Procedure: TRANSESOPHAGEAL ECHOCARDIOGRAM (TEE);  Surgeon: Larey Dresser, MD;  Location: [facility] ENDOSCOPY;  Service: Cardiovascular;  Laterality: N/A;  . TEE  WITHOUT CARDIOVERSION N/A 05/08/2019   Procedure: TRANSESOPHAGEAL ECHOCARDIOGRAM (TEE);  Surgeon: Burnell Blanks, MD;  Location: [place] CV LAB;  Service: Open Heart Surgery;  Laterality: N/A;  . TRANSCATHETER AORTIC VALVE REPLACEMENT, TRANSFEMORAL N/A 05/08/2019   Procedure: TRANSCATHETER AORTIC VALVE REPLACEMENT, TRANSFEMORAL;   Surgeon: Burnell Blanks, MD;  Location: [place] CV LAB;  Service: Open Heart Surgery;  Laterality: N/A;    Allergies: Xarelto [rivaroxaban], Corticosteroids, Ramipril, and Benazepril  Medications: Prior to Admission medications   Medication Sig Start Date End Date Taking? Authorizing Provider  acyclovir (ZOVIRAX) 400 MG tablet Take 1 tablet (400 mg total) by mouth 2 (two) times daily. 05/17/19  Yes Brunetta Genera, MD  amLODipine (NORVASC) 5 MG tablet Take 1 tablet (5 mg total) by mouth daily. 05/16/19 05/10/20 Yes Eileen Stanford, PA-C  apixaban (ELIQUIS) 5 MG TABS tablet Take 1 tablet (5 mg total) by mouth 2 (two) times daily. 10/13/18  Yes Larey Dresser, MD  aspirin 81 MG chewable tablet Chew 1 tablet (81 mg total) by mouth daily. 05/12/19  Yes Eileen Stanford, PA-C  dexamethasone (DECADRON) 4 MG tablet Take 5 tablets (20 mg total) by mouth once a week. 05/17/19  Yes Brunetta Genera, MD  famotidine (PEPCID) 40 MG tablet Take 1 tablet (40 mg total) by mouth daily. Patient taking differently: Take 40 mg by mouth at bedtime.  01/29/19  Yes [name], [name], MD  furosemide (LASIX) 20 MG tablet Take 1 tablet (20 mg total) by mouth daily. 05/16/19 05/10/20 Yes Eileen Stanford, PA-C  HYDROcodone-acetaminophen (NORCO/VICODIN) 5-325 MG tablet Take 1 tablet by mouth every 6 (six) hours as needed for severe pain. 03/19/19 03/18/20 Yes [name], [name], MD  lenalidomide (REVLIMID) 15 MG capsule Take 1 capsule (15 mg total) by mouth daily. Take for 14 days on, 7 days off, repeat every 21 days. [employer] # [id number], 05/18/2019 05/22/19  Yes Brunetta Genera, MD  loratadine (CLARITIN) 10 MG tablet Take 1 tablet (10 mg total) by mouth daily. 12/11/18  Yes [name], [name], MD  LORazepam (ATIVAN) 0.5 MG tablet Take 1 tablet (0.5 mg total) by mouth every 6 (six) hours as needed (Nausea or vomiting). 05/17/19  Yes Brunetta Genera, MD  metoprolol succinate (TOPROL-XL) 50 MG 24 hr  tablet TAKE 1 TABLET (50 MG TOTAL) BY MOUTH 2 (TWO) TIMES DAILY. TAKE WITH OR IMMEDIATELY FOLLOWING A MEAL. 01/10/19  Yes Larey Dresser, MD  ondansetron (ZOFRAN) 8 MG tablet Take 1 tablet (8 mg total) by mouth 2 (two) times daily as needed (Nausea or vomiting). 05/17/19  Yes Brunetta Genera, MD  predniSONE (DELTASONE) 5 MG tablet Take 3 tablets (15 mg total) by mouth daily with breakfast. 01/29/19  Yes [name], [name], MD  prochlorperazine (COMPAZINE) 10 MG tablet Take 1 tablet (10 mg total) by mouth every 6 (six) hours as needed (Nausea or vomiting). 05/17/19  Yes Brunetta Genera, MD  Vitamin D, Cholecalciferol, 50 MCG (2000 UT) CAPS Take 2,000 Units by mouth daily.   Yes [provider]     Family History  Problem Relation Age of Onset  . Colon cancer Mother 80  . Hypertension Other   . Coronary artery disease Neg Hx   . Colon polyps Neg Hx   . Esophageal cancer Neg Hx   . Rectal cancer Neg Hx   . Stomach cancer Neg Hx     Social History   Socioeconomic History  . Marital status: Married  Spouse name: Not on file  . Number of children: 0  . Years of education: Not on file  . Highest education level: Not on file  Occupational History  . Occupation: Retired Lobbyist: [employer]  Social Needs  . Financial resource strain: Not on file  . Food insecurity    Worry: Not on file    Inability: Not on file  . Transportation needs    Medical: Not on file    Non-medical: Not on file  Tobacco Use  . Smoking status: Never Smoker  . Smokeless tobacco: Never Used  Substance and Sexual Activity  . Alcohol use: Yes    Comment: Drinks 1 glass of wine nightly/socially  . Drug use: No  . Sexual activity: Yes  Lifestyle  . Physical activity    Days per week: Not on file    Minutes per session: Not on file  . Stress: Not on file  Relationships  . Social Herbalist on phone: Not on file    Gets together: Not on file    Attends  religious service: Not on file    Active member of club or organization: Not on file    Attends meetings of clubs or organizations: Not on file    Relationship status: Not on file  Other Topics Concern  . Not on file  Social History Narrative   Patient lives in [place] w/ his wife. He is a native of [place]. 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   Right-handed   Caffeine: 2 cups coffee per day     Review of Systems: A 12 point ROS discussed and pertinent positives are indicated in the HPI above.  All other systems are negative.  Review of Systems  Constitutional: Negative for chills and fever.  Respiratory: Negative for shortness of breath and wheezing.   Cardiovascular: Negative for chest pain and palpitations.  Gastrointestinal: Negative for abdominal pain.  Neurological: Negative for headaches.  Psychiatric/Behavioral: Negative for behavioral problems and confusion.    Vital Signs: BP (!) 152/93   Pulse 62   Temp 98.4 F (36.9 C) (Oral)   Resp 18   SpO2 99%   Physical Exam Vitals signs and nursing note reviewed.  Constitutional:      General: He is not in acute distress.    Appearance: Normal appearance.  Cardiovascular:     Rate and Rhythm: Normal rate and regular rhythm.     Heart sounds: Normal heart sounds. No murmur.  Pulmonary:     Effort: Pulmonary effort is normal. No respiratory distress.     Breath sounds: Normal breath sounds. No wheezing.  Skin:    General: Skin is warm and dry.  Neurological:     Mental Status: He is alert and oriented to person, place, and time.  Psychiatric:        Mood and Affect: Mood normal.        Behavior: Behavior normal.        Thought Content: Thought content normal.        Judgment: Judgment normal.      MD Evaluation Airway: WNL Heart: WNL Abdomen: WNL Chest/ Lungs: WNL ASA  Classification: 3 Mallampati/Airway Score: Two   Imaging: Dg Chest 2 View  Result Date:  05/05/2019 CLINICAL DATA:  Preoperative, heart valve replacement EXAM: CHEST - 2 VIEW COMPARISON:  None. FINDINGS: Cardiomegaly. Mild diffuse interstitial pulmonary opacity and trace  pleural effusions and/or pleural thickening. Disc degenerative disease of the thoracic spine. IMPRESSION: Cardiomegaly. Mild diffuse interstitial pulmonary opacity and trace pleural effusions and/or pleural thickening, findings likely reflecting mild edema. No focal airspace opacity. Electronically Signed   By: Eddie Candle M.D.   On: 05/05/2019 14:28   Dg Chest Port 1 View  Result Date: 05/09/2019 CLINICAL DATA:  High fever and malaise. EXAM: PORTABLE CHEST 1 VIEW COMPARISON:  Single-view of the chest 05/08/2019. PA and lateral chest 05/04/2019. FINDINGS: There is cardiomegaly without edema. No consolidative process, pneumothorax or effusion. Atherosclerosis and aortic valve replacement noted. IMPRESSION: No acute disease. Electronically Signed   By: Inge Rise M.D.   On: 05/09/2019 15:38   Dg Chest Port 1 View  Result Date: 05/08/2019 CLINICAL DATA:  Status post TAVR EXAM: PORTABLE CHEST 1 VIEW COMPARISON:  05/04/2019 FINDINGS: Bilateral diffuse interstitial thickening. No pleural effusion or pneumothorax. Stable cardiomediastinal silhouette. Interval TAVR. No acute osseous abnormality. IMPRESSION: Interval TAVR. Bilateral interstitial thickening likely reflecting mild interstitial edema. Electronically Signed   By: Kathreen Devoid   On: 05/08/2019 14:01    Labs:  CBC: Recent Labs    05/10/19 0329 05/10/19 0832 05/11/19 0352 05/24/19 0815  WBC 8.3 8.3 7.4 8.7  HGB 13.6 13.7 12.5* 14.3  HCT 41.3 41.1 38.3* 44.5  PLT 134* 141* 131* 229    COAGS: Recent Labs    07/21/18 1637 04/06/19 1103 04/16/19 0955 05/04/19 1620  INR 1.15 1.2 1.2 1.0  APTT  --   --   --  29    BMP: Recent Labs    05/04/19 1620  05/08/19 1200 05/09/19 0234 05/09/19 0819 05/10/19 0329 05/11/19 0352  NA 139   < > 141 139   --  141 141  K 4.0   < > 3.1* 3.3* 3.4* 4.1 3.7  CL 105  --  101 100  --  103 106  CO2 23  --   --  29  --  26 27  GLUCOSE 115*   < > 108* 117*  --  106* 106*  BUN 14  --  13 16  --  21 22  CALCIUM 9.3  --   --  8.8*  --  8.7* 8.6*  CREATININE 0.76  --  0.70 1.11  --  1.23 1.05  GFRNONAA >60  --   --  >60  --  55* >60  GFRAA >60  --   --  >60  --  >60 >60   < > = values in this interval not displayed.    LIVER FUNCTION TESTS: Recent Labs    04/30/19 1236 05/04/19 1620  BILITOT 0.4 0.8  AST 24 23  ALT 30 <5  ALKPHOS 107 103  PROT 7.9 6.8  ALBUMIN 4.2 3.8     Assessment and Plan:  Multiple myeloma. Plan for image-guided bone marrow biopsy/aspiration today with Dr. Anselm Pancoast. Patient is NPO. Afebrile and WBCs WNL. Ok to proceed with Eliquis use per Dr. Anselm Pancoast. INR pending.  Risks and benefits discussed with the patient including, but not limited to bleeding, infection, damage to adjacent structures or low yield requiring additional tests. All of the patient's questions were answered, patient is agreeable to proceed. Consent signed and in chart.   Thank you for this interesting consult.  I greatly enjoyed meeting [name] [name] and look forward to participating in their care.  A copy of this report was sent to the requesting provider on this date.  Electronically Signed: Claris Pong  Tifini Reeder, PA-C 05/24/2019, 8:55 AM   I spent a total of 40 Minutes in face to face in clinical consultation, greater than 50% of which was counseling/coordinating care for multiple myeloma/bone marrow biopsy and aspiration.

## 2019-05-24 NOTE — Progress Notes (Signed)
HEMATOLOGY/ONCOLOGY CLINIC NOTE  Date of Service: 05/25/2019  Patient Care Team: Cassandria Anger, MD as PCP - General Erline Levine, MD as Attending Physician (Neurosurgery) Larey Dresser, MD (Cardiology) Jarome Matin, MD as Consulting Physician (Dermatology) Rigoberto Noel, MD as Consulting Physician (Pulmonary Disease)   CHIEF COMPLAINTS/PURPOSE OF CONSULTATION:  Newly diagnosed Plasma cell myeloma   HISTORY OF PRESENTING ILLNESS:  [name] [name] is a wonderful 78 y.o. male who has been referred to [place] by Angelena Form, PA for evaluation and management of lytic bone lesions. The pt reports that he is doing well overall.  The pt reports that he has occasional hip pain that radiates down his legs and prevents him from walking. He uses Advil, which helps his hip pain. He is used to exercising often, but has not been able to stay very physically active lately so he is gaining weight. The pt experiences some SOB when he wakes up in the morning.  The pt had a pre-procedural CTA C/A/P before a TAVR completed on 03/14/2019 which revealed "indeterminate osseus lesions in the bony pelvis," which led to an MRI and PET scan. He reports that he has pain when he pushes on his right chest.   He also notes that he had a fall while exercising in 06/2018 and thought he broke his back. He received a blood transfusion on 07/24/2018.   Of note prior to the patient's visit today, the pt has had a MRI pelvis w/wo contrast completed on 03/28/2019 with results revealing "1. Destructive bone lesions as detailed above. Findings most consistent with metastatic disease. PET-CT may be helpful for further evaluation and to establish a primary tumor. The right pelvic bone lesions should be amenable to image guided biopsy but a PET scan may demonstrate easier/safer biopsy sites. 2. No intrapelvic mass or adenopathy. 3. Benign intraosseous lipoma involving the left anterior superior acetabulum."  The pt has  also had PET whole body completed on 04/06/2019 with results revealing "1. Diffuse osseous metastatic disease as detailed above without findings for a primary neoplasm in the chest, abdomen or pelvis. The large destructive lesion involving the right ischium should be amenable to image guided biopsy. 2. Two small retroperitoneal lymph nodes and 1 small right obturator node showing hypermetabolism."   Most recent lab results (04/06/2019) of CBC is as follows: all values are WNL.  On review of systems, pt reports hip and leg pain, weight gain and denies syncope and denies belly pain, recent neuropathy and any other symptoms.   On PMHx the pt reports 5 cm hepatic flexure polyp removal, pneumonia, blood transfusion on 07/24/2018  On Social Hx the pt reports that he lives at home with his wife. He is from [place].    INTERVAL HISTORY:  [name] [name] is a wonderful 78 y.o. male who has who is here today for evaluation and management of lytic bone lesions. The patient's last visit with [place] was on 05/16/2019. The pt reports that he is doing well overall.    The pt reports that his pain has decreased some. He denies any pain at the shot or biopsy site.    He continues on Velcade; today is day 4 cycle 1. The pt has no prohibitive toxicities from continuing this treatment at this time.    He had a bone marrow biopsy on 05/24/2019. Pathology from this procedure is not yet available.   Lab results today (05/25/19) of CBC w/diff and CMP is as follows: all values are WNL  except for Ab immature granulocytes at 0.17, glucose at 119, calcium at 8.7, and ALT at 46.  On review of systems, pt reports mild fatigue and denies nausea, vomiting, rash, anxiety and any other symptoms.    MEDICAL HISTORY:  Past Medical History:  Diagnosis Date   Acute blood loss anemia    Adenomatous colon polyp    Ascending aortic aneurysm (HCC)    Bicuspid aortic valve    Chest pain    ETT-myoview 12/11 w/exercise, no  chest pain, no significant ST changes, EF 69%, no evidence for ischemia or infarction.   CHF NYHA class I (no symptoms from ordinary activities), acute, diastolic (HCC)    Fatty liver    mild   GI bleed    GI bleeding 07/21/2018   post polypectomy   Hemorrhoids    HTN (hypertension)    Hypercholesteremia    Hypokalemia    Internal hemorrhoids    LBP (low back pain)    Moderate aortic stenosis    Osteoarthritis    Paroxysmal atrial fibrillation ([place])    a. new onset Afib in 07/2008. He underwent ibutilide cardioversion successfully. b. Recurrence 01/2013 s/p TEE/DCCV - was on Xarelto but he stopped it as he was convinced it was causing joint pn. c. Recurrence 01/2016 - spont conv to NSR. Pt took Eliquis x1 mo then declined further anticoag. d. Recurrence 07/2016.   Pneumonia    Tubular adenoma of colon     SURGICAL HISTORY: Past Surgical History:  Procedure Laterality Date   BACK SURGERY  x12 years ago   CARDIOVERSION N/A 01/26/2013   Procedure: CARDIOVERSION;  Surgeon: Larey Dresser, MD;  Location: [place];  Service: Cardiovascular;  Laterality: N/A;   CARDIOVERSION N/A 10/28/2017   Procedure: CARDIOVERSION;  Surgeon: Larey Dresser, MD;  Location: [facility] ENDOSCOPY;  Service: Cardiovascular;  Laterality: N/A;   CARDIOVERSION N/A 03/03/2018   Procedure: CARDIOVERSION;  Surgeon: Lelon Perla, MD;  Location: [facility] ENDOSCOPY;  Service: Cardiovascular;  Laterality: N/A;   COLONOSCOPY     COLONOSCOPY  07/17/2018   at [place]     RIGHT/LEFT HEART CATH AND CORONARY ANGIOGRAPHY N/A 03/07/2019   Procedure: RIGHT/LEFT HEART CATH AND CORONARY ANGIOGRAPHY;  Surgeon: Burnell Blanks, MD;  Location: [place] CV LAB;  Service: Cardiovascular;  Laterality: N/A;   TEE WITHOUT CARDIOVERSION N/A 01/26/2013   Procedure: TRANSESOPHAGEAL ECHOCARDIOGRAM (TEE);  Surgeon: Larey Dresser, MD;  Location: [place];  Service: Cardiovascular;  Laterality: N/A;   TEE WITHOUT CARDIOVERSION N/A 10/28/2017   Procedure: TRANSESOPHAGEAL ECHOCARDIOGRAM (TEE);  Surgeon: Larey Dresser, MD;  Location: [facility] ENDOSCOPY;  Service: Cardiovascular;  Laterality: N/A;   TEE WITHOUT CARDIOVERSION N/A 05/08/2019   Procedure: TRANSESOPHAGEAL ECHOCARDIOGRAM (TEE);  Surgeon: Burnell Blanks, MD;  Location: [place] CV LAB;  Service: Open Heart Surgery;  Laterality: N/A;   TRANSCATHETER AORTIC VALVE REPLACEMENT, TRANSFEMORAL N/A 05/08/2019   Procedure: TRANSCATHETER AORTIC VALVE REPLACEMENT, TRANSFEMORAL;  Surgeon: Burnell Blanks, MD;  Location: [place] CV LAB;  Service: Open Heart Surgery;  Laterality: N/A;    SOCIAL HISTORY: Social History   Socioeconomic History   Marital status: Married    Spouse name: Not on file   Number of children: 0   Years of education: Not on file   Highest education level: Not on file  Occupational History   Occupation: Retired Lobbyist: [employer]  Social Designer, fashion/clothing strain: Not on file   Food insecurity    Worry: Not on file    Inability: Not on file   Transportation needs    Medical: Not on file    Non-medical: Not on file  Tobacco Use   Smoking status: Never Smoker   Smokeless tobacco: Never Used  Substance and Sexual Activity   Alcohol use: Yes    Comment: Drinks 1 glass of wine nightly/socially   Drug use: No   Sexual activity: Yes  Lifestyle   Physical activity    Days per week: Not on file    Minutes per session: Not on file   Stress: Not on file  Relationships   Social connections    Talks on phone: Not on file    Gets together: Not on file    Attends religious service: Not on file    Active member of club or organization: Not on file    Attends meetings of clubs or organizations: Not on file    Relationship status: Not on file   Intimate partner violence    Fear of current or  ex partner: Not on file    Emotionally abused: Not on file    Physically abused: Not on file    Forced sexual activity: Not on file  Other Topics Concern   Not on file  Social History Narrative   Patient lives in [place] w/ his wife. He is a native of [place]. 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   Right-handed   Caffeine: 2 cups coffee per day    FAMILY HISTORY: Family History  Problem Relation Age of Onset   Colon cancer Mother 71   Hypertension Other    Coronary artery disease Neg Hx    Colon polyps Neg Hx    Esophageal cancer Neg Hx    Rectal cancer Neg Hx    Stomach cancer Neg Hx     ALLERGIES:  is allergic to xarelto [rivaroxaban]; corticosteroids; ramipril; and benazepril.  MEDICATIONS:  Current Outpatient Medications  Medication Sig Dispense Refill   acyclovir (ZOVIRAX) 400 MG tablet Take 1 tablet (400 mg total) by mouth 2 (two) times daily. 60 tablet 3   amLODipine (NORVASC) 5 MG tablet Take 1 tablet (5 mg total) by mouth daily. 90 tablet 3   apixaban (ELIQUIS) 5 MG TABS tablet Take 1 tablet (5 mg total) by mouth 2 (two) times daily. 60 tablet 6   aspirin 81 MG chewable tablet Chew 1 tablet (81 mg total) by mouth daily.     dexamethasone (DECADRON) 4 MG tablet Take 5 tablets (20 mg total) by mouth once a week. 20 tablet 6   famotidine (PEPCID) 40 MG tablet Take 1 tablet (40 mg total) by mouth daily. (Patient taking differently: Take 40 mg by mouth at bedtime. ) 30 tablet 11   furosemide (LASIX) 20 MG tablet Take 1 tablet (20 mg total) by mouth daily. 90 tablet 3   HYDROcodone-acetaminophen (NORCO/VICODIN) 5-325 MG tablet Take 1 tablet by mouth every 6 (six) hours as needed for severe pain. 60 tablet 0   lenalidomide (REVLIMID) 15 MG capsule Take 1 capsule (15 mg total) by mouth daily. Take for 14 days on, 7 days off, repeat every 21 days. [employer] # [id number], 05/18/2019 14 capsule 0   loratadine (CLARITIN) 10 MG  tablet Take 1 tablet (10 mg total) by mouth daily. 30 tablet  11   LORazepam (ATIVAN) 0.5 MG tablet Take 1 tablet (0.5 mg total) by mouth every 6 (six) hours as needed (Nausea or vomiting). 30 tablet 0   metoprolol succinate (TOPROL-XL) 50 MG 24 hr tablet TAKE 1 TABLET (50 MG TOTAL) BY MOUTH 2 (TWO) TIMES DAILY. TAKE WITH OR IMMEDIATELY FOLLOWING A MEAL. 180 tablet 3   ondansetron (ZOFRAN) 8 MG tablet Take 1 tablet (8 mg total) by mouth 2 (two) times daily as needed (Nausea or vomiting). 30 tablet 1   predniSONE (DELTASONE) 5 MG tablet Take 3 tablets (15 mg total) by mouth daily with breakfast. 90 tablet 3   prochlorperazine (COMPAZINE) 10 MG tablet Take 1 tablet (10 mg total) by mouth every 6 (six) hours as needed (Nausea or vomiting). 30 tablet 1   Vitamin D, Cholecalciferol, 50 MCG (2000 UT) CAPS Take 2,000 Units by mouth daily.     No current facility-administered medications for this visit.    Facility-Administered Medications Ordered in Other Visits  Medication Dose Route Frequency Provider Last Rate Last Dose   bortezomib SQ (VELCADE) chemo injection 2.75 mg  1.3 mg/m2 (Treatment Plan Recorded) Subcutaneous Once Brunetta Genera, MD       prochlorperazine (COMPAZINE) tablet 10 mg  10 mg Oral Once Brunetta Genera, MD        REVIEW OF SYSTEMS:    A 10+ POINT REVIEW OF SYSTEMS WAS OBTAINED including neurology, dermatology, psychiatry, cardiac, respiratory, lymph, extremities, GI, GU, Musculoskeletal, constitutional, breasts, reproductive, HEENT.  All pertinent positives are noted in the HPI.  All others are negative.    PHYSICAL EXAMINATION: ECOG PERFORMANCE STATUS: 2 - Symptomatic, <50% confined to bed  . Vitals:   05/25/19 0912  BP: (!) 144/74  Pulse: 62  Resp: 17  Temp: 98.3 F (36.8 C)  SpO2: 100%   Filed Weights   05/25/19 0912  Weight: 193 lb 12.8 oz (87.9 kg)   .Body mass index is 27.03 kg/m.    GENERAL:alert, in no acute distress and  comfortable SKIN: no acute rashes, no significant lesions EYES: conjunctiva are pink and non-injected, sclera anicteric OROPHARYNX: MMM, no exudates, no oropharyngeal erythema or ulceration NECK: supple, no JVD LYMPH:  no palpable lymphadenopathy in the cervical, axillary or inguinal regions LUNGS: clear to auscultation b/l with normal respiratory effort HEART: regular rate & rhythm ABDOMEN:  normoactive bowel sounds , non tender, not distended. Extremity: no pedal edema PSYCH: alert & oriented x 3 with fluent speech NEURO: no focal motor/sensory deficits   LABORATORY DATA:  I have reviewed the data as listed  . CBC Latest Ref Rng & Units 05/25/2019 05/24/2019 05/11/2019  WBC 4.0 - 10.5 K/uL 8.3 8.7 7.4  Hemoglobin 13.0 - 17.0 g/dL 14.0 14.3 12.5(L)  Hematocrit 39.0 - 52.0 % 42.7 44.5 38.3(L)  Platelets 150 - 400 K/uL 207 229 131(L)    . CMP Latest Ref Rng & Units 05/25/2019 05/11/2019 05/10/2019  Glucose 70 - 99 mg/dL 119(H) 106(H) 106(H)  BUN 8 - 23 mg/dL _0 Creatinine 0.61 - 1.24 mg/dL 1.00 1.05 1.23  Sodium 135 - 145 mmol/L 140 141 141  Potassium 3.5 - 5.1 mmol/L 3.8 3.7 4.1  Chloride 98 - 111 mmol/L 106 106 103  CO2 22 - 32 mmol/L _1 Calcium 8.9 - 10.3 mg/dL 8.7(L) 8.6(L) 8.7(L)  Total Protein 6.5 - 8.1 g/dL 6.9 - -  Total Bilirubin 0.3 - 1.2 mg/dL 0.4 - -  Alkaline Phos 38 - 126  U/L 112 - -  AST 15 - 41 U/L 28 - -  ALT 0 - 44 U/L 46(H) - -   Component     Latest Ref Rng & Units 04/30/2019  Total Protein, Urine-UPE24     Not Estab. mg/dL   Total Protein, Urine-Ur/day     30 - 150 mg/24 hr   ALBUMIN, U     %   ALPHA 1 URINE     %   Alpha 2, Urine     %   % BETA, Urine     %   GAMMA GLOBULIN URINE     %   Free Kappa Lt Chains,Ur     0.63 - 113.79 mg/L   Free Lambda Lt Chains,Ur     0.47 - 11.77 mg/L   Free Kappa/Lambda Ratio     1.03 - 31.76   Immunofixation Result, Urine        Total Volume        M-SPIKE %, Urine     Not Observed %    M-Spike, mg/24 hr     Not Observed mg/24 hr   NOTE:        IgG (Immunoglobin G), Serum     603 - 1,613 mg/dL 1,740 (H)  IgA     61 - 437 mg/dL 16 (L)  IgM (Immunoglobulin M), Srm     15 - 143 mg/dL 9 (L)  Total Protein ELP     6.0 - 8.5 g/dL 7.3  Albumin SerPl Elph-Mcnc     2.9 - 4.4 g/dL 3.8  Alpha 1     0.0 - 0.4 g/dL 0.3  Alpha2 Glob SerPl Elph-Mcnc     0.4 - 1.0 g/dL 0.7  B-Globulin SerPl Elph-Mcnc     0.7 - 1.3 g/dL 0.9  Gamma Glob SerPl Elph-Mcnc     0.4 - 1.8 g/dL 1.6  M Protein SerPl Elph-Mcnc     Not Observed g/dL 1.5 (H)  Globulin, Total     2.2 - 3.9 g/dL 3.5  Albumin/Glob SerPl     0.7 - 1.7 1.1  IFE 1      Comment  Please Note (HCV):      Comment  Kappa free light chain     3.3 - 19.4 mg/L 821.2 (H)  Lamda free light chains     5.7 - 26.3 mg/L 1.9 (L)  Kappa, lamda light chain ratio     0.26 - 1.65 432.21 (H)  LDH     98 - 192 U/L 132  Sed Rate     0 - 16 mm/hr 3   04/16/2019 Surgical Pathology:   RADIOGRAPHIC STUDIES: I have personally reviewed the radiological images as listed and agreed with the findings in the report. Dg Chest 2 View  Result Date: 05/05/2019 CLINICAL DATA:  Preoperative, heart valve replacement EXAM: CHEST - 2 VIEW COMPARISON:  None. FINDINGS: Cardiomegaly. Mild diffuse interstitial pulmonary opacity and trace pleural effusions and/or pleural thickening. Disc degenerative disease of the thoracic spine. IMPRESSION: Cardiomegaly. Mild diffuse interstitial pulmonary opacity and trace pleural effusions and/or pleural thickening, findings likely reflecting mild edema. No focal airspace opacity. Electronically Signed   By: Eddie Candle M.D.   On: 05/05/2019 14:28   Ct Biopsy  Result Date: 05/24/2019 INDICATION: 78 year old with multiple myeloma.  Request for bone marrow biopsy. EXAM: CT GUIDED BONE MARROW ASPIRATES AND BIOPSY Physician: Stephan Minister. Anselm Pancoast, MD MEDICATIONS: None. ANESTHESIA/SEDATION: Fentanyl 50 mcg IV; Versed 2.0 mg IV  Moderate  Sedation Time:  10 minutes The patient was continuously monitored during the procedure by the interventional radiology nurse under my direct supervision. COMPLICATIONS: None immediate. PROCEDURE: The procedure was explained to the patient. The risks and benefits of the procedure were discussed and the patient's questions were addressed. Informed consent was obtained from the patient. The patient was placed prone on CT table. Images of the pelvis were obtained. The right side of back was prepped and draped in sterile fashion. The skin and right posterior ilium were anesthetized with 1% lidocaine. 11 gauge bone needle was directed into the right ilium with CT guidance. Two aspirates and one core biopsy were obtained. Bandage placed over the puncture site. IMPRESSION: CT guided bone marrow aspiration and core biopsy. Electronically Signed   By: Markus Daft M.D.   On: 05/24/2019 11:19   Dg Chest Port 1 View  Result Date: 05/09/2019 CLINICAL DATA:  High fever and malaise. EXAM: PORTABLE CHEST 1 VIEW COMPARISON:  Single-view of the chest 05/08/2019. PA and lateral chest 05/04/2019. FINDINGS: There is cardiomegaly without edema. No consolidative process, pneumothorax or effusion. Atherosclerosis and aortic valve replacement noted. IMPRESSION: No acute disease. Electronically Signed   By: Inge Rise M.D.   On: 05/09/2019 15:38   Dg Chest Port 1 View  Result Date: 05/08/2019 CLINICAL DATA:  Status post TAVR EXAM: PORTABLE CHEST 1 VIEW COMPARISON:  05/04/2019 FINDINGS: Bilateral diffuse interstitial thickening. No pleural effusion or pneumothorax. Stable cardiomediastinal silhouette. Interval TAVR. No acute osseous abnormality. IMPRESSION: Interval TAVR. Bilateral interstitial thickening likely reflecting mild interstitial edema. Electronically Signed   By: Kathreen Devoid   On: 05/08/2019 14:01   Ct Bone Marrow Biopsy & Aspiration  Result Date: 05/24/2019 INDICATION: 78 year old with multiple myeloma.   Request for bone marrow biopsy. EXAM: CT GUIDED BONE MARROW ASPIRATES AND BIOPSY Physician: Stephan Minister. Anselm Pancoast, MD MEDICATIONS: None. ANESTHESIA/SEDATION: Fentanyl 50 mcg IV; Versed 2.0 mg IV Moderate Sedation Time:  10 minutes The patient was continuously monitored during the procedure by the interventional radiology nurse under my direct supervision. COMPLICATIONS: None immediate. PROCEDURE: The procedure was explained to the patient. The risks and benefits of the procedure were discussed and the patient's questions were addressed. Informed consent was obtained from the patient. The patient was placed prone on CT table. Images of the pelvis were obtained. The right side of back was prepped and draped in sterile fashion. The skin and right posterior ilium were anesthetized with 1% lidocaine. 11 gauge bone needle was directed into the right ilium with CT guidance. Two aspirates and one core biopsy were obtained. Bandage placed over the puncture site. IMPRESSION: CT guided bone marrow aspiration and core biopsy. Electronically Signed   By: Markus Daft M.D.   On: 05/24/2019 11:19    ASSESSMENT & PLAN:   78 yo   #1 Newly diagnosed Plasma cell myeloma  03/28/2019 MRI pelvis w/wo contrast revealed "1. Destructive bone lesions as detailed above. Findings most consistent with metastatic disease. PET-CT may be helpful for further evaluation and to establish a primary tumor. The right pelvic bone lesions should be amenable to image guided biopsy but a PET scan may demonstrate easier/safer biopsy sites. 2. No intrapelvic mass or adenopathy. 3. Benign intraosseous lipoma involving the left anterior superior acetabulum."  04/06/2019 PET whole body revealed "1. Diffuse osseous metastatic disease as detailed above without findings for a primary neoplasm in the chest, abdomen or pelvis. The large destructive lesion involving the right ischium should be amenable to image guided  biopsy. 2. Two small retroperitoneal lymph nodes  and 1 small right obturator node showing hypermetabolism."   04/16/2019 Posterior right pelvis bone biopsy revealed PLASMA CELL NEOPLASM  #2 Severe aortic stenosis with bicuspid aortic valve -10/26/2018 ECHO revealed AVA at 0.8 cm2 and LV EF of 60-65% -05/08/2019 pt had a Transfemoral Transcatheter Aortic Valve Replacement   PLAN: -Discussed pt labwork today, 05/25/19; all values are WNL except for Ab immature granulocytes at 0.17, glucose at 119, calcium at 8.7, and ALT at 46. -The pt has no prohibitive toxicities from continuing Velcade at this time.  -Discussed the importance of taking calcium while on Zometa -Discussed Revlimid and the treatment plan related to this medication -Recommended walking against doing any high impact or high strength exercise to avoid injury   FOLLOW UP: Please schedule D8 and D11 of current C1 of treatment Please schedule C2 and C3 of treatment as per orders. Labs on D1 and D8 of each cycles MD visit on D1 of each cycle of treatment   The total time spent in the appt was 20 minutes and more than 50% was on counseling and direct patient cares.  All of the patient's questions were answered with apparent satisfaction. The patient knows to call the clinic with any problems, questions or concerns.    Sullivan Lone MD MS AAHIVMS [facility] Hematology/Oncology Physician [facility]  (Office):       [phone] (Work cell):  [phone] (Fax):           [phone]  05/25/2019 10:06 AM  I, Jacqualyn Posey, am acting as a Education administrator for Dr. Sullivan Lone.   .I have reviewed the above documentation for accuracy and completeness, and I agree with the above. Brunetta Genera MD

## 2019-05-24 NOTE — Discharge Instructions (Addendum)
Contact Interventional Radiologist PA or IR on call at [phone] with any questions or concerns   Bone Marrow Aspiration and Bone Marrow Biopsy, Adult, Care After This sheet gives you information about how to care for yourself after your procedure. Your health care provider may also give you more specific instructions. If you have problems or questions, contact your health care provider. What can I expect after the procedure? After the procedure, it is common to have:  Mild pain and tenderness.  Swelling.  Bruising. Follow these instructions at home: Puncture site care      Follow instructions from your health care provider about how to take care of the puncture site. Make sure you: ? Wash your hands with soap and water before you change your bandage (dressing). If soap and water are not available, use hand sanitizer. ? Change your dressing as told by your health care provider.  You may remove your dressing tomorrow.  Check your puncture siteevery day for signs of infection. Check for: ? More redness, swelling, or pain. ? More fluid or blood. ? Warmth. ? Pus or a bad smell. General instructions  Take over-the-counter and prescription medicines only as told by your health care provider.  Do not take baths, swim, or use a hot tub until your health care provider approves. Ask if you can take a shower or have a sponge bath.  You may shower tomorrow.  Return to your normal activities as told by your health care provider. Ask your health care provider what activities are safe for you.  Do not drive for 24 hours if you were given a medicine to help you relax (sedative) during your procedure.  Keep all follow-up visits as told by your health care provider. This is important. Contact a health care provider if:  Your pain is not controlled with medicine. Get help right away if:  You have a fever.  You have more redness, swelling, or pain around the puncture site.  You have  more fluid or blood coming from the puncture site.  Your puncture site feels warm to the touch.  You have pus or a bad smell coming from the puncture site. These symptoms may represent a serious problem that is an emergency. Do not wait to see if the symptoms will go away. Get medical help right away. Call your local emergency services (911 in the [place] Do not drive yourself to the hospital. Summary  After the procedure, it is common to have mild pain, tenderness, swelling, and bruising.  Follow instructions from your health care provider about how to take care of the puncture site.  Get help right away if you have any symptoms of infection or if you have more blood or fluid coming from the puncture site. This information is not intended to replace advice given to you by your health care provider. Make sure you discuss any questions you have with your health care provider. Document Released: 03/19/2005 Document Revised: 12/13/2017 Document Reviewed: 02/11/2016 Elsevier Patient Education  [place].   Moderate Conscious Sedation, Adult, Care After These instructions provide you with information about caring for yourself after your procedure. Your health care provider may also give you more specific instructions. Your treatment has been planned according to current medical practices, but problems sometimes occur. Call your health care provider if you have any problems or questions after your procedure. What can I expect after the procedure? After your procedure, it is common:  To feel sleepy for several  hours.  To feel clumsy and have poor balance for several hours.  To have poor judgment for several hours.  To vomit if you eat too soon. Follow these instructions at home: For at least 24 hours after the procedure:   Do not: ? Participate in activities where you could fall or become injured. ? Drive. ? Use heavy machinery. ? Drink alcohol. ? Take sleeping pills or  medicines that cause drowsiness. ? Make important decisions or sign legal documents. ? Take care of children on your own.  Rest. Eating and drinking  Follow the diet recommended by your health care provider.  If you vomit: ? Drink water, juice, or soup when you can drink without vomiting. ? Make sure you have little or no nausea before eating solid foods. General instructions  Have a responsible adult stay with you until you are awake and alert.  Take over-the-counter and prescription medicines only as told by your health care provider.  If you smoke, do not smoke without supervision.  Keep all follow-up visits as told by your health care provider. This is important. Contact a health care provider if:  You keep feeling nauseous or you keep vomiting.  You feel light-headed.  You develop a rash.  You have a fever. Get help right away if:  You have trouble breathing. This information is not intended to replace advice given to you by your health care provider. Make sure you discuss any questions you have with your health care provider. Document Released: 06/20/2013 Document Revised: 08/12/2017 Document Reviewed: 12/20/2015 Elsevier Patient Education  2020 [employer].

## 2019-05-25 ENCOUNTER — Inpatient Hospital Stay (HOSPITAL_BASED_OUTPATIENT_CLINIC_OR_DEPARTMENT_OTHER): Payer: Medicare Other | Admitting: Hematology

## 2019-05-25 ENCOUNTER — Other Ambulatory Visit: Payer: Self-pay

## 2019-05-25 ENCOUNTER — Inpatient Hospital Stay: Payer: Medicare Other

## 2019-05-25 VITALS — BP 144/74 | HR 62 | Temp 98.3°F | Resp 17 | Ht 71.0 in | Wt 193.8 lb

## 2019-05-25 DIAGNOSIS — M25559 Pain in unspecified hip: Secondary | ICD-10-CM | POA: Diagnosis not present

## 2019-05-25 DIAGNOSIS — C9000 Multiple myeloma not having achieved remission: Secondary | ICD-10-CM | POA: Diagnosis not present

## 2019-05-25 DIAGNOSIS — C7951 Secondary malignant neoplasm of bone: Secondary | ICD-10-CM

## 2019-05-25 DIAGNOSIS — Z7189 Other specified counseling: Secondary | ICD-10-CM

## 2019-05-25 DIAGNOSIS — I480 Paroxysmal atrial fibrillation: Secondary | ICD-10-CM | POA: Diagnosis not present

## 2019-05-25 DIAGNOSIS — R5383 Other fatigue: Secondary | ICD-10-CM | POA: Diagnosis not present

## 2019-05-25 DIAGNOSIS — R0602 Shortness of breath: Secondary | ICD-10-CM | POA: Diagnosis not present

## 2019-05-25 DIAGNOSIS — Z5111 Encounter for antineoplastic chemotherapy: Secondary | ICD-10-CM | POA: Diagnosis not present

## 2019-05-25 LAB — CMP (CANCER CENTER ONLY)
ALT: 46 U/L — ABNORMAL HIGH (ref 0–44)
AST: 28 U/L (ref 15–41)
Albumin: 3.8 g/dL (ref 3.5–5.0)
Alkaline Phosphatase: 112 U/L (ref 38–126)
Anion gap: 6 (ref 5–15)
BUN: 21 mg/dL (ref 8–23)
CO2: 28 mmol/L (ref 22–32)
Calcium: 8.7 mg/dL — ABNORMAL LOW (ref 8.9–10.3)
Chloride: 106 mmol/L (ref 98–111)
Creatinine: 1 mg/dL (ref 0.61–1.24)
GFR, Est AFR Am: >60 mL/min (ref >60)
GFR, Estimated: >60 mL/min (ref >60)
Glucose, Bld: 119 mg/dL — ABNORMAL HIGH (ref 70–99)
Potassium: 3.8 mmol/L (ref 3.5–5.1)
Sodium: 140 mmol/L (ref 135–145)
Total Bilirubin: 0.4 mg/dL (ref 0.3–1.2)
Total Protein: 6.9 g/dL (ref 6.5–8.1)

## 2019-05-25 LAB — CBC WITH DIFFERENTIAL (CANCER CENTER ONLY)
Abs Immature Granulocytes: 0.17 10*3/uL — ABNORMAL HIGH (ref 0.00–0.07)
Basophils Absolute: 0 10*3/uL (ref 0.0–0.1)
Basophils Relative: 0 %
Eosinophils Absolute: 0.1 10*3/uL (ref 0.0–0.5)
Eosinophils Relative: 1 %
HCT: 42.7 % (ref 39.0–52.0)
Hemoglobin: 14 g/dL (ref 13.0–17.0)
Immature Granulocytes: 2 %
Lymphocytes Relative: 16 %
Lymphs Abs: 1.4 10*3/uL (ref 0.7–4.0)
MCH: 30.6 pg (ref 26.0–34.0)
MCHC: 32.8 g/dL (ref 30.0–36.0)
MCV: 93.4 fL (ref 80.0–100.0)
Monocytes Absolute: 0.9 10*3/uL (ref 0.1–1.0)
Monocytes Relative: 11 %
Neutro Abs: 5.8 10*3/uL (ref 1.7–7.7)
Neutrophils Relative %: 70 %
Platelet Count: 207 10*3/uL (ref 150–400)
RBC: 4.57 MIL/uL (ref 4.22–5.81)
RDW: 13.1 % (ref 11.5–15.5)
WBC Count: 8.3 10*3/uL (ref 4.0–10.5)
nRBC: 0 % (ref 0.0–0.2)

## 2019-05-25 MED ORDER — PROCHLORPERAZINE MALEATE 10 MG PO TABS
10.0000 mg | ORAL_TABLET | Freq: Once | ORAL | Status: AC
  Administered 2019-05-25: 10 mg via ORAL

## 2019-05-25 MED ORDER — BORTEZOMIB CHEMO SQ INJECTION 3.5 MG (2.5MG/ML)
1.3000 mg/m2 | Freq: Once | INTRAMUSCULAR | Status: AC
  Administered 2019-05-25: 2.75 mg via SUBCUTANEOUS
  Filled 2019-05-25: qty 1.1

## 2019-05-25 MED ORDER — PROCHLORPERAZINE MALEATE 10 MG PO TABS
ORAL_TABLET | ORAL | Status: AC
  Filled 2019-05-25: qty 1

## 2019-05-25 NOTE — Telephone Encounter (Signed)
Oral Oncology Patient Advocate Encounter  I met the patient in the lobby today, he signed the application and he gave me a copy of his 2019 tax return.  I faxed the completed application today, 0/86.  This encounter will be updated until final determination.  [name] [name] Phone [phone] Fax [phone] 05/25/2019   12:30 PM

## 2019-05-25 NOTE — Patient Instructions (Signed)
[facility] Discharge Instructions for Patients Receiving Chemotherapy  Today you received the following chemotherapy agents: Bortezomib (VELCADE).  To help prevent nausea and vomiting after your treatment, we encourage you to take your nausea medication as prescribed.   If you develop nausea and vomiting that is not controlled by your nausea medication, call the clinic.   BELOW ARE SYMPTOMS THAT SHOULD BE REPORTED IMMEDIATELY:  *FEVER GREATER THAN 100.5 F  *CHILLS WITH OR WITHOUT FEVER  NAUSEA AND VOMITING THAT IS NOT CONTROLLED WITH YOUR NAUSEA MEDICATION  *UNUSUAL SHORTNESS OF BREATH  *UNUSUAL BRUISING OR BLEEDING  TENDERNESS IN MOUTH AND THROAT WITH OR WITHOUT PRESENCE OF ULCERS  *URINARY PROBLEMS  *BOWEL PROBLEMS  UNUSUAL RASH Items with * indicate a potential emergency and should be followed up as soon as possible.  Feel free to call the clinic should you have any questions or concerns. The clinic phone number is [phone].  Please show the CHEMO ALERT CARD at check-in to the Emergency Department and triage nurse.   Coronavirus (COVID-19) Are you at risk?  Are you at risk for the Coronavirus (COVID-19)?  To be considered HIGH RISK for Coronavirus (COVID-19), you have to meet the following criteria:  . Traveled to [place], [place], [place], [place] or [place]; or in the [place] to [place], [place], [place], or [place]; and have fever, cough, and shortness of breath within the last 2 weeks of travel OR . Been in close contact with a person diagnosed with COVID-19 within the last 2 weeks and have fever, cough, and shortness of breath . IF YOU DO NOT MEET THESE CRITERIA, YOU ARE CONSIDERED LOW RISK FOR COVID-19.  What to do if you are HIGH RISK for COVID-19?  . If you are having a medical emergency, call 911. . Seek medical care right away. Before you go to a doctor's office, urgent care or emergency department, call ahead and tell them  about your recent travel, contact with someone diagnosed with COVID-19, and your symptoms. You should receive instructions from your physician's office regarding next steps of care.  . When you arrive at healthcare provider, tell the healthcare staff immediately you have returned from visiting [place], [place], [place], [place] or [place]; or traveled in the [place] to [place], [place], [place], or [place]; in the last two weeks or you have been in close contact with a person diagnosed with COVID-19 in the last 2 weeks.   . Tell the health care staff about your symptoms: fever, cough and shortness of breath. . After you have been seen by a medical provider, you will be either: o Tested for (COVID-19) and discharged home on quarantine except to seek medical care if symptoms worsen, and asked to  - Stay home and avoid contact with others until you get your results (4-5 days)  - Avoid travel on public transportation if possible (such as bus, train, or airplane) or o Sent to the Emergency Department by EMS for evaluation, COVID-19 testing, and possible admission depending on your condition and test results.  What to do if you are LOW RISK for COVID-19?  Reduce your risk of any infection by using the same precautions used for avoiding the common cold or flu:  . Wash your hands often with soap and warm water for at least 20 seconds.  If soap and water are not readily available, use an alcohol-based hand sanitizer with at least 60% alcohol.  . If coughing   or sneezing, cover your mouth and nose by coughing or sneezing into the elbow areas of your shirt or coat, into a tissue or into your sleeve (not your hands). . Avoid shaking hands with others and consider head nods or verbal greetings only. . Avoid touching your eyes, nose, or mouth with unwashed hands.  . Avoid close contact with people who are sick. . Avoid places or events with large numbers of people in one location, like concerts or  sporting events. . Carefully consider travel plans you have or are making. . If you are planning any travel outside or inside the [place], visit the CDC's Travelers' Health webpage for the latest health notices. . If you have some symptoms but not all symptoms, continue to monitor at home and seek medical attention if your symptoms worsen. . If you are having a medical emergency, call 911.   ADDITIONAL HEALTHCARE OPTIONS FOR PATIENTS  [facility] / e-Visit: [email or web address]         [facility]: [phone]  [facility]: [phone]                   [facility]: [phone]   

## 2019-05-28 ENCOUNTER — Other Ambulatory Visit: Payer: Self-pay | Admitting: *Deleted

## 2019-05-28 ENCOUNTER — Telehealth: Payer: Self-pay | Admitting: Hematology

## 2019-05-28 DIAGNOSIS — C9000 Multiple myeloma not having achieved remission: Secondary | ICD-10-CM

## 2019-05-28 NOTE — Progress Notes (Addendum)
[facility]                                       Cardiology Office Note    Date:  05/30/2019   ID:  [name] [name], DOB Sep 16, 1940, MRN [id number]  PCP:  Cassandria Anger, MD  Cardiologist:  Dr. Aundra Dubin / Dr. Angelena Form & Dr. Cyndia Bent (TAVR)  CC: 1 month s/p TAVR  History of Present Illness:  [name] [name] is a 77 y.o. male with a history of HTN, PAF on Eliquis, anemia, ascending aortic aneurysm, HLD, recently diagnosed multiple myeloma during TAVR work up and severe AS s/p TAVR (05/08/19) who presents to clinic for follow up.  He is followed in our practice by Dr. Aundra Dubin and has been known to have aortic stenosis for several years. He has been see by Dr. Rayann Heman for management of his atrial fibrillation and has had multiple cardioversions in the past. He has been on Eliquis. Echo February 2020 with normal LV size and function, LVEF=60-65%. The aortic valve is thickened and calcified (mean gradient of 48 mmHg, peak gradient 65.9 mmHg, AVA 0.83 cm2) consistent with severe AS. Coronary CTA February 2020 with mild CAD. His ascending aortic aneurysm was 4.3 cm on recent cardiac CTA. He reported a several month history of dyspnea with moderate exertion and fatigue.He was referred to Dr. Angelena Form for consideration of TAVR. Pre TAVR cath 03/07/19 showed no CAD and confirmed severe AS. Pre TAVR CT scans showed multiple indeterminate osseous lesions in the bony pelvis. Follow up MRI confirmed destructive bone lesions and PET scan showed diffuse osseous metastatic disease as detailed above without findings for a primary neoplasm in the chest, abdomen or pelvis. CT guided biopsy confirmed clear plasma cells. He was referred to Dr. Irene Limbo in the cancer center for evaluation who felt it was reasonable to proceed with TAVR prior to chemo treatment of his multiple myeloma. During this work up the patient had a subjective episode of afib and was treated with  amiodarone. He self converted before planned DCCV.  He was evaluated by the multidisciplinary valve team and underwent successful TAVR with a 26 mm Edwards Sapien 3 UItra THV x2 (second valve placed due to PVL) via the TF approach on 05/08/19. The following night, Mr Muscarella developed a post operative fever of unknown significance. He was seen by ID. Repeat WBC, UA, CXR and blood cultures were negative. Post operative echo showed hyperdynamic EF >65% with normally functioning TAVR with elevated mean gradient of 29 mm Hg (likely 2/2 hyperdynamic function in the setting of febrile illness and valve in valve procedure). He had some mild volume overload and treated with one dose of IV lasix. Also had frequent runs of SVT vs afib with RVR and home BB was resumed. He was discharged when fever resolved on home Eliquis and aspirin added.   At one week follow up ECG showed new RBBB, 1st deg AV block, LAFB with HR 62. He was hypertensive with acute CHF. His HCTZ was discontinue and started on lasix 23m x3 days followed by Lasix 246mdaily and norvasc 61m68maily.   He saw Dr. KalIrene Limbod was started on Dexamethasone, Velcade & Revlimid.  Today he presents to clinic for follow up. He has been doing better. LE edema has resolved and breathing improved since TAVR. Chest pain resolved. His HRs have mostly been  in the 74s and 60s which is normal for him. No CP or SOB. No LE edema, orthopnea or PND. No dizziness or syncope. No blood in stool or urine. No palpitations.    Past Medical History:  Diagnosis Date  . Acute blood loss anemia   . Adenomatous colon polyp   . Ascending aortic aneurysm ([place])   . Bicuspid aortic valve   . Chest pain    ETT-myoview 12/11 w/exercise, no chest pain, no significant ST changes, EF 69%, no evidence for ischemia or infarction.  . CHF NYHA class I (no symptoms from ordinary activities), acute, diastolic ([place])   . Fatty liver    mild  . GI bleed   . GI bleeding 07/21/2018   post  polypectomy  . Hemorrhoids   . HTN (hypertension)   . Hypercholesteremia   . Hypokalemia   . Internal hemorrhoids   . LBP (low back pain)   . Moderate aortic stenosis   . Osteoarthritis   . Paroxysmal atrial fibrillation ([place])    a. new onset Afib in 07/2008. He underwent ibutilide cardioversion successfully. b. Recurrence 01/2013 s/p TEE/DCCV - was on Xarelto but he stopped it as he was convinced it was causing joint pn. c. Recurrence 01/2016 - spont conv to NSR. Pt took Eliquis x1 mo then declined further anticoag. d. Recurrence 07/2016.  [name] Pneumonia   . Tubular adenoma of colon     Past Surgical History:  Procedure Laterality Date  . BACK SURGERY  x12 years ago  . CARDIOVERSION N/A 01/26/2013   Procedure: CARDIOVERSION;  Surgeon: Larey Dresser, MD;  Location: [facility] ENDOSCOPY;  Service: Cardiovascular;  Laterality: N/A;  . CARDIOVERSION N/A 10/28/2017   Procedure: CARDIOVERSION;  Surgeon: Larey Dresser, MD;  Location: [facility] ENDOSCOPY;  Service: Cardiovascular;  Laterality: N/A;  . CARDIOVERSION N/A 03/03/2018   Procedure: CARDIOVERSION;  Surgeon: Lelon Perla, MD;  Location: [facility] ENDOSCOPY;  Service: Cardiovascular;  Laterality: N/A;  . COLONOSCOPY    . COLONOSCOPY  07/17/2018   at [facility]  . HEMORRHOID SURGERY    . LUMBAR LAMINECTOMY    . POLYPECTOMY    . RIGHT/LEFT HEART CATH AND CORONARY ANGIOGRAPHY N/A 03/07/2019   Procedure: RIGHT/LEFT HEART CATH AND CORONARY ANGIOGRAPHY;  Surgeon: Burnell Blanks, MD;  Location: [place] CV LAB;  Service: Cardiovascular;  Laterality: N/A;  . TEE WITHOUT CARDIOVERSION N/A 01/26/2013   Procedure: TRANSESOPHAGEAL ECHOCARDIOGRAM (TEE);  Surgeon: Larey Dresser, MD;  Location: [place];  Service: Cardiovascular;  Laterality: N/A;  . TEE WITHOUT CARDIOVERSION N/A 10/28/2017   Procedure: TRANSESOPHAGEAL ECHOCARDIOGRAM (TEE);  Surgeon: Larey Dresser, MD;  Location: [facility] ENDOSCOPY;  Service: Cardiovascular;  Laterality: N/A;  . TEE WITHOUT  CARDIOVERSION N/A 05/08/2019   Procedure: TRANSESOPHAGEAL ECHOCARDIOGRAM (TEE);  Surgeon: Burnell Blanks, MD;  Location: [place] CV LAB;  Service: Open Heart Surgery;  Laterality: N/A;  . TRANSCATHETER AORTIC VALVE REPLACEMENT, TRANSFEMORAL N/A 05/08/2019   Procedure: TRANSCATHETER AORTIC VALVE REPLACEMENT, TRANSFEMORAL;  Surgeon: Burnell Blanks, MD;  Location: [place] CV LAB;  Service: Open Heart Surgery;  Laterality: N/A;    Current Medications: Outpatient Medications Prior to Visit  Medication Sig Dispense Refill  . acyclovir (ZOVIRAX) 400 MG tablet Take 1 tablet (400 mg total) by mouth 2 (two) times daily. 60 tablet 3  . amLODipine (NORVASC) 5 MG tablet Take 1 tablet (5 mg total) by mouth daily. 90 tablet 3  . apixaban (ELIQUIS) 5 MG TABS tablet Take 1 tablet (5  mg total) by mouth 2 (two) times daily. 60 tablet 6  . aspirin 81 MG chewable tablet Chew 1 tablet (81 mg total) by mouth daily.    [name] dexamethasone (DECADRON) 4 MG tablet Take 5 tablets (20 mg total) by mouth once a week. 20 tablet 6  . famotidine (PEPCID) 40 MG tablet Take 1 tablet (40 mg total) by mouth daily. 30 tablet 11  . furosemide (LASIX) 20 MG tablet Take 1 tablet (20 mg total) by mouth daily. 90 tablet 3  . HYDROcodone-acetaminophen (NORCO/VICODIN) 5-325 MG tablet Take 1 tablet by mouth every 6 (six) hours as needed for severe pain. 60 tablet 0  . lenalidomide (REVLIMID) 15 MG capsule Take 1 capsule (15 mg total) by mouth daily. Take for 14 days on, 7 days off, repeat every 21 days. [employer] # [id number], 05/18/2019 14 capsule 0  . loratadine (CLARITIN) 10 MG tablet Take 1 tablet (10 mg total) by mouth daily. 30 tablet 11  . LORazepam (ATIVAN) 0.5 MG tablet Take 1 tablet (0.5 mg total) by mouth every 6 (six) hours as needed (Nausea or vomiting). 30 tablet 0  . metoprolol succinate (TOPROL-XL) 50 MG 24 hr tablet TAKE 1 TABLET (50 MG TOTAL) BY MOUTH 2 (TWO) TIMES DAILY. TAKE WITH OR IMMEDIATELY FOLLOWING A  MEAL. 180 tablet 3  . ondansetron (ZOFRAN) 8 MG tablet Take 1 tablet (8 mg total) by mouth 2 (two) times daily as needed (Nausea or vomiting). 30 tablet 1  . predniSONE (DELTASONE) 5 MG tablet TAKE 3 TABLETS (15 MG TOTAL) BY MOUTH DAILY WITH BREAKFAST. 90 tablet 1  . prochlorperazine (COMPAZINE) 10 MG tablet Take 1 tablet (10 mg total) by mouth every 6 (six) hours as needed (Nausea or vomiting). 30 tablet 1  . Vitamin D, Cholecalciferol, 50 MCG (2000 UT) CAPS Take 2,000 Units by mouth daily.     No facility-administered medications prior to visit.      Allergies:   Xarelto [rivaroxaban], Corticosteroids, Ramipril, and Benazepril   Social History   Socioeconomic History  . Marital status: Married    Spouse name: Not on file  . Number of children: 0  . Years of education: Not on file  . Highest education level: Not on file  Occupational History  . Occupation: Retired Lobbyist: [employer]  Social Needs  . Financial resource strain: Not on file  . Food insecurity    Worry: Not on file    Inability: Not on file  . Transportation needs    Medical: Not on file    Non-medical: Not on file  Tobacco Use  . Smoking status: Never Smoker  . Smokeless tobacco: Never Used  Substance and Sexual Activity  . Alcohol use: Yes    Comment: Drinks 1 glass of wine nightly/socially  . Drug use: No  . Sexual activity: Yes  Lifestyle  . Physical activity    Days per week: Not on file    Minutes per session: Not on file  . Stress: Not on file  Relationships  . Social Herbalist on phone: Not on file    Gets together: Not on file    Attends religious service: Not on file    Active member of club or organization: Not on file    Attends meetings of clubs or organizations: Not on file    Relationship status: Not on file  Other Topics Concern  . Not on file  Social History Narrative  Patient lives in [place] w/ his wife. He is a native of [place]. 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   Right-handed   Caffeine: 2 cups coffee per day     Family History:  The patient's family history includes Colon cancer (age of onset: 40) in his mother; Hypertension in an other family member.     ROS:   Please see the history of present illness.    ROS All other systems reviewed and are negative.   PHYSICAL EXAM:   VS:  BP 130/68   Pulse (!) 49   Ht 5' 11" (1.803 m)   Wt 192 lb (87.1 kg)   SpO2 98%   BMI 26.78 kg/m  GEN: Well nourished, well developed, in no acute distress HEENT: normal Neck: no JVD or masses Cardiac: bradycardic; 3/6 SEM. No rubs, or gallops.  Respiratory:  clear to auscultation bilaterally, normal work of breathing GI: soft, nontender, nondistended, + BS MS: no deformity or atrophy Skin: warm and dry, no rash. Neuro:  Alert and Oriented x 3, Strength and sensation are intact Psych: euthymic mood, full affect   Wt Readings from Last 3 Encounters:  05/30/19 192 lb (87.1 kg)  05/25/19 193 lb 12.8 oz (87.9 kg)  05/16/19 192 lb 8 oz (87.3 kg)      Studies/Labs Reviewed:   EKG:  EKG is ordered today.  The ekg ordered today demonstrates sinus brady with HR 49 bpm and PVC and TWIs in V3-V6 similar to previous.   Recent Labs: 10/03/2018: TSH 4.76 05/04/2019: B Natriuretic Peptide 1,279.6 05/09/2019: Magnesium 1.5 05/29/2019: ALT 43; BUN 25; Creatinine 0.98; Hemoglobin 13.8; Platelet Count 128; Potassium 4.1; Sodium 142   Lipid Panel    Component Value Date/Time   CHOL 144 08/01/2018 1119   TRIG 167.0 (H) 08/01/2018 1119   HDL 28.20 (L) 08/01/2018 1119   CHOLHDL 5 08/01/2018 1119   VLDL 33.4 08/01/2018 1119   LDLCALC 82 08/01/2018 1119   LDLDIRECT 151.5 11/20/2007 1623    Additional studies/ records that were reviewed today include:  TAVR OPERATIVE NOTE   Date of Procedure:05/08/2019  Preoperative Diagnosis:Severe Aortic Stenosis   Postoperative  Diagnosis:Same   Procedure:   Transcatheter Aortic Valve Replacement - PercutaneousRightTransfemoral Approach EdwardsSapien 3 Ultra THV (size67m, model # 9750TFX, serial # [id number] EdwardsSapien 3 Ultra THV (size231m model # 9750TFX, serial # [id number] Co-Surgeons:Bryan K.Alveria ApleyMD and ChLauree ChandlerMD   Anesthesiologist:R. ElRoanna BanningMD  EcDala DockMD  Pre-operative Echo Findings: ? Severe aortic stenosis ? Normalleft ventricular systolic function  Post-operative Echo Findings: ? Noparavalvular leak ? Normalleft ventricular systolic function  _____________    Echo 05/09/19 IMPRESSIONS 1. - TAVR: A 26 mm S3 x 2 was placed (second valve placed due to PVL). A mild to moderate degree of prosthetic valve stenosis is present. There is no regurgitation or paravalvular leak detected. The V max is 3.6 m/s. The peak/mean gradient across the  valve is 49/29 mmHG. The LVOT VTI is estimated to be 34 cm and the LV is hyperdynamic with an intracavitary gradient ~6 mmHG. Overall, the increased gradients across the valve could be related to the ViV procedure (2 S3 placed) and the hyperdynamic state of the LV (LVOT VTI 34 cm). The doppler velocity index is 0.57 and would indicate a mild degree of stenosis. The Vmax and gradients are increased compared with yesterday's valves after the TAVR. Clinical correlation is recommended. 2. The left ventricle  has hyperdynamic systolic function, with an ejection fraction of >65%. The cavity size was small. Left ventricular diastolic Doppler parameters are consistent with pseudonormalization. Elevated mean left atrial pressure No evidence of left ventricular regional wall motion abnormalities. 3. The right ventricle has normal systolic function. The cavity was normal. There is no increase in right ventricular wall  thickness. 4. Left atrial size was mildly dilated. 5. Right atrial size was mildly dilated. 6. The mitral valve is grossly normal. There is mild mitral annular calcification present. 7. The tricuspid valve is grossly normal. 8. A 26 an Edwards 2 Edwards S3 valves placed due to PVL during procedure valve is present in the aortic position. Procedure Date: 05/09/2019. 9. Mild-moderate stenosis of the aortic valve. 10. The aorta is normal unless otherwise noted. 11. The aortic root is normal in size and structure. 12. When compared to the prior study: S/p TAVR. V max and gradients higher across TAVR compared with images after procedure yesterday.  ________________  Echo 05/30/19 IMPRESSIONS  1. The left ventricle has hyperdynamic systolic function, with an ejection fraction of >65%. The cavity size was normal. There is mildly increased left ventricular wall thickness. Left ventricular diastolic Doppler parameters are consistent with  impaired relaxation. Elevated mean left atrial pressure No evidence of left ventricular regional wall motion abnormalities.  2. The right ventricle has normal systolic function. The cavity was mildly enlarged. There is No increase in right ventricular wall thickness.  3. Left atrial size was normal.  4. Right atrial size was normal.  5. The mitral valve is degenerative. mild thickening of the mitral valve leaflet. mild calcification of the mitral valve leaflet. There is mild mitral annular calcification present. No evidence of mitral valve stenosis.  6. Peak trans TAVR velocity - 3.236ms, mean 237mg.  7. Pulmonic valve regurgitation is trivial by color flow Doppler.  8. The aorta is abnormal unless otherwise noted.  9. There is dilatation of the ascending aorta measuring 44 mm.  Aortic Valve: The aortic valve has been repaired/replaced Aortic valve regurgitation was not visualized by color flow Doppler. Peak trans TAVR velocity - 3.36m736m mean 36m56m   ASSESSMENT & PLAN:   Severe AS s/p TAVR: echo today shows EF 65%, normally functioning TAVR with a mean gradient of 26 mm Hg (slighlly improved from POD1 echo with a mean gradient of 29 mm Hg) and no PVL. He has NYHA class I symptoms with complete resolution in chest pain since TAVR. SBE prophylaxis discussed; he said he will get ABx from his dentist. ASA can be discontinued after 6 months of therapy (10/2019).  HTN: BP better controlled today with the addition of amlodipine.   PAF: maintaining sinus on ecg today. Continue Eliquis and Toprol   Chronic diastolic CHF: he appears euvolemic today. Continue lasix 20mg236mly.   New conduction disease: last ecg showed new RBBB, LAFB and 1st deg AV block. ECG today shows sinus bradycardia with resolution of previous conduction distubance. He is asymptomatic with HRs in high 40s. He said it HR mostly runs in the 50-60s at home. I asked him to keep an eye on his HR and let us kn[place] if he is sustaining in the 40s and we will cut back his toprol.   Ascending aortic aneurysm: continue to follow over time. Radiologist recommended semi annual CTA/MRA. Will defer to Dr. McLeaAundra Dubinmonary nodules: Scattered 2-3 mm pulmonary nodules in the lungs bilaterally, nonspecific but statistically likely benign. No follow-up needed if patient is low-risk (and  has no known or suspected primary neoplasm). Non-contrast chest CT can be considered in 12 months if patient is high-risk. Patient is a never smoker and being actively followed by oncology. Recent PET scan was negative in lung.   Multiple Myeloma: newly diagnosed during TAVR work up and followed by Dr. Irene Limbo.    Medication Adjustments/Labs and Tests Ordered: Current medicines are reviewed at length with the patient today.  Concerns regarding medicines are outlined above.  Medication changes, Labs and Tests ordered today are listed in the Patient Instructions below. Patient Instructions  Medication Instructions:   1) you may STOP ASPIRIN 11/08/2019  Follow-Up: Your provider recommends that you schedule a follow-up appointment in 3 months with Dr. Aundra Dubin. His office will call you to arrange an appointment.  Your 1 year TAVR echo and office visit is scheduled 05/07/2020. Please arrive by 1:45PM for these appointments.     Signed, Angelena Form, PA-C  05/30/2019 9:17 PM    [facility] [address], [place], [place]  [address] Phone: [phone]; Fax: [phone]

## 2019-05-28 NOTE — Telephone Encounter (Signed)
Oral Oncology Patient Advocate Encounter  The patient called me to discuss this, he has not spend 3% of his income on prescriptions this year yet. His income is to high to receive assistance.  The patient is willing to pay the $1786.26 copay but is interested in hearing what other options may be available to him.   I have his Revlimid start date as 9/29 and Denyse Amass, Oral Oncology Pharmacist will be able to counsel him first of next week then we can arrange with [facility] to get the medicine in his hands if that is the route he decides to choose.  I do have another pharmacist, Alyson who can counsel if we need to get him the medicine sooner.  Oral Oncology clinic will continue to follow.  [place] Patient [place] Phone [phone] Fax [phone] 05/28/2019   4:05 PM

## 2019-05-28 NOTE — Telephone Encounter (Signed)
Schedule appt per 9/11 los.  Spoke with patient and he is aware of his appt date and time.

## 2019-05-28 NOTE — Telephone Encounter (Signed)
Oral Oncology Patient Advocate Encounter  I received an email from [place] at [employer]. Debby states that the patients income is to high to qualify for assistance but may be a candidate for the 3% rule.  The 3% rule means that the patient would have to spend 3% of his gross income on medications before he could be approved for the program.  Debby is going to contact the patient and go over this with him.  This encounter will be updated until final determination.  [place] Patient [place] Phone [phone] Fax [phone] 05/28/2019   9:59 AM

## 2019-05-29 ENCOUNTER — Inpatient Hospital Stay: Payer: Medicare Other

## 2019-05-29 ENCOUNTER — Other Ambulatory Visit: Payer: Self-pay

## 2019-05-29 VITALS — BP 156/88 | HR 64 | Temp 98.7°F | Resp 18

## 2019-05-29 DIAGNOSIS — Z7189 Other specified counseling: Secondary | ICD-10-CM

## 2019-05-29 DIAGNOSIS — R0602 Shortness of breath: Secondary | ICD-10-CM | POA: Diagnosis not present

## 2019-05-29 DIAGNOSIS — M25559 Pain in unspecified hip: Secondary | ICD-10-CM | POA: Diagnosis not present

## 2019-05-29 DIAGNOSIS — I480 Paroxysmal atrial fibrillation: Secondary | ICD-10-CM | POA: Diagnosis not present

## 2019-05-29 DIAGNOSIS — C9000 Multiple myeloma not having achieved remission: Secondary | ICD-10-CM

## 2019-05-29 DIAGNOSIS — Z5111 Encounter for antineoplastic chemotherapy: Secondary | ICD-10-CM | POA: Diagnosis not present

## 2019-05-29 DIAGNOSIS — C7951 Secondary malignant neoplasm of bone: Secondary | ICD-10-CM

## 2019-05-29 DIAGNOSIS — R5383 Other fatigue: Secondary | ICD-10-CM | POA: Diagnosis not present

## 2019-05-29 LAB — CBC WITH DIFFERENTIAL (CANCER CENTER ONLY)
Abs Immature Granulocytes: 0.17 10*3/uL — ABNORMAL HIGH (ref 0.00–0.07)
Basophils Absolute: 0 10*3/uL (ref 0.0–0.1)
Basophils Relative: 0 %
Eosinophils Absolute: 0 10*3/uL (ref 0.0–0.5)
Eosinophils Relative: 0 %
HCT: 42.5 % (ref 39.0–52.0)
Hemoglobin: 13.8 g/dL (ref 13.0–17.0)
Immature Granulocytes: 2 %
Lymphocytes Relative: 10 %
Lymphs Abs: 0.9 10*3/uL (ref 0.7–4.0)
MCH: 30.9 pg (ref 26.0–34.0)
MCHC: 32.5 g/dL (ref 30.0–36.0)
MCV: 95.1 fL (ref 80.0–100.0)
Monocytes Absolute: 0.7 10*3/uL (ref 0.1–1.0)
Monocytes Relative: 7 %
Neutro Abs: 7.3 10*3/uL (ref 1.7–7.7)
Neutrophils Relative %: 81 %
Platelet Count: 128 10*3/uL — ABNORMAL LOW (ref 150–400)
RBC: 4.47 MIL/uL (ref 4.22–5.81)
RDW: 13.2 % (ref 11.5–15.5)
WBC Count: 9.1 10*3/uL (ref 4.0–10.5)
nRBC: 0 % (ref 0.0–0.2)

## 2019-05-29 LAB — CMP (CANCER CENTER ONLY)
ALT: 43 U/L (ref 0–44)
AST: 26 U/L (ref 15–41)
Albumin: 4 g/dL (ref 3.5–5.0)
Alkaline Phosphatase: 101 U/L (ref 38–126)
Anion gap: 6 (ref 5–15)
BUN: 25 mg/dL — ABNORMAL HIGH (ref 8–23)
CO2: 31 mmol/L (ref 22–32)
Calcium: 9.3 mg/dL (ref 8.9–10.3)
Chloride: 105 mmol/L (ref 98–111)
Creatinine: 0.98 mg/dL (ref 0.61–1.24)
GFR, Est AFR Am: >60 mL/min (ref >60)
GFR, Estimated: >60 mL/min (ref >60)
Glucose, Bld: 125 mg/dL — ABNORMAL HIGH (ref 70–99)
Potassium: 4.1 mmol/L (ref 3.5–5.1)
Sodium: 142 mmol/L (ref 135–145)
Total Bilirubin: 0.4 mg/dL (ref 0.3–1.2)
Total Protein: 6.8 g/dL (ref 6.5–8.1)

## 2019-05-29 MED ORDER — DEXAMETHASONE 4 MG PO TABS
ORAL_TABLET | ORAL | Status: AC
  Filled 2019-05-29: qty 5

## 2019-05-29 MED ORDER — PROCHLORPERAZINE MALEATE 10 MG PO TABS
10.0000 mg | ORAL_TABLET | Freq: Once | ORAL | Status: AC
  Administered 2019-05-29: 10 mg via ORAL

## 2019-05-29 MED ORDER — BORTEZOMIB CHEMO SQ INJECTION 3.5 MG (2.5MG/ML)
1.3000 mg/m2 | Freq: Once | INTRAMUSCULAR | Status: AC
  Administered 2019-05-29: 2.75 mg via SUBCUTANEOUS
  Filled 2019-05-29: qty 1.1

## 2019-05-29 MED ORDER — PROCHLORPERAZINE MALEATE 10 MG PO TABS
ORAL_TABLET | ORAL | Status: AC
  Filled 2019-05-29: qty 1

## 2019-05-29 MED ORDER — DEXAMETHASONE 4 MG PO TABS
20.0000 mg | ORAL_TABLET | Freq: Once | ORAL | Status: AC
  Administered 2019-05-29: 20 mg via ORAL

## 2019-05-29 NOTE — Patient Instructions (Signed)
[facility] Discharge Instructions for Patients Receiving Chemotherapy  Today you received the following chemotherapy agents: Bortezomib (VELCADE).  To help prevent nausea and vomiting after your treatment, we encourage you to take your nausea medication as prescribed.   If you develop nausea and vomiting that is not controlled by your nausea medication, call the clinic.   BELOW ARE SYMPTOMS THAT SHOULD BE REPORTED IMMEDIATELY:  *FEVER GREATER THAN 100.5 F  *CHILLS WITH OR WITHOUT FEVER  NAUSEA AND VOMITING THAT IS NOT CONTROLLED WITH YOUR NAUSEA MEDICATION  *UNUSUAL SHORTNESS OF BREATH  *UNUSUAL BRUISING OR BLEEDING  TENDERNESS IN MOUTH AND THROAT WITH OR WITHOUT PRESENCE OF ULCERS  *URINARY PROBLEMS  *BOWEL PROBLEMS  UNUSUAL RASH Items with * indicate a potential emergency and should be followed up as soon as possible.  Feel free to call the clinic should you have any questions or concerns. The clinic phone number is [phone].  Please show the CHEMO ALERT CARD at check-in to the Emergency Department and triage nurse.   Coronavirus (COVID-19) Are you at risk?  Are you at risk for the Coronavirus (COVID-19)?  To be considered HIGH RISK for Coronavirus (COVID-19), you have to meet the following criteria:  . Traveled to [place], [place], [place], [place] or [place]; or in the [place] to [place], [place], [place], or [place]; and have fever, cough, and shortness of breath within the last 2 weeks of travel OR . Been in close contact with a person diagnosed with COVID-19 within the last 2 weeks and have fever, cough, and shortness of breath . IF YOU DO NOT MEET THESE CRITERIA, YOU ARE CONSIDERED LOW RISK FOR COVID-19.  What to do if you are HIGH RISK for COVID-19?  . If you are having a medical emergency, call 911. . Seek medical care right away. Before you go to a doctor's office, urgent care or emergency department, call ahead and tell them  about your recent travel, contact with someone diagnosed with COVID-19, and your symptoms. You should receive instructions from your physician's office regarding next steps of care.  . When you arrive at healthcare provider, tell the healthcare staff immediately you have returned from visiting [place], [place], [place], [place] or [place]; or traveled in the [place] to [place], [place], [place], or [place]; in the last two weeks or you have been in close contact with a person diagnosed with COVID-19 in the last 2 weeks.   . Tell the health care staff about your symptoms: fever, cough and shortness of breath. . After you have been seen by a medical provider, you will be either: o Tested for (COVID-19) and discharged home on quarantine except to seek medical care if symptoms worsen, and asked to  - Stay home and avoid contact with others until you get your results (4-5 days)  - Avoid travel on public transportation if possible (such as bus, train, or airplane) or o Sent to the Emergency Department by EMS for evaluation, COVID-19 testing, and possible admission depending on your condition and test results.  What to do if you are LOW RISK for COVID-19?  Reduce your risk of any infection by using the same precautions used for avoiding the common cold or flu:  . Wash your hands often with soap and warm water for at least 20 seconds.  If soap and water are not readily available, use an alcohol-based hand sanitizer with at least 60% alcohol.  . If coughing   or sneezing, cover your mouth and nose by coughing or sneezing into the elbow areas of your shirt or coat, into a tissue or into your sleeve (not your hands). . Avoid shaking hands with others and consider head nods or verbal greetings only. . Avoid touching your eyes, nose, or mouth with unwashed hands.  . Avoid close contact with people who are sick. . Avoid places or events with large numbers of people in one location, like concerts or  sporting events. . Carefully consider travel plans you have or are making. . If you are planning any travel outside or inside the [place], visit the CDC's Travelers' Health webpage for the latest health notices. . If you have some symptoms but not all symptoms, continue to monitor at home and seek medical attention if your symptoms worsen. . If you are having a medical emergency, call 911.   ADDITIONAL HEALTHCARE OPTIONS FOR PATIENTS  [facility] Tele[facility] / e-Visit: [email or web address]         [facility]: [phone]  [facility]: [phone]                   [facility]: [phone]   

## 2019-05-30 ENCOUNTER — Ambulatory Visit (HOSPITAL_COMMUNITY): Payer: Medicare Other | Attending: Cardiology

## 2019-05-30 ENCOUNTER — Ambulatory Visit (INDEPENDENT_AMBULATORY_CARE_PROVIDER_SITE_OTHER): Payer: Medicare Other | Admitting: Physician Assistant

## 2019-05-30 ENCOUNTER — Encounter: Payer: Self-pay | Admitting: Physician Assistant

## 2019-05-30 VITALS — BP 130/68 | HR 49 | Ht 71.0 in | Wt 192.0 lb

## 2019-05-30 DIAGNOSIS — Z952 Presence of prosthetic heart valve: Secondary | ICD-10-CM | POA: Insufficient documentation

## 2019-05-30 DIAGNOSIS — I459 Conduction disorder, unspecified: Secondary | ICD-10-CM

## 2019-05-30 DIAGNOSIS — C9000 Multiple myeloma not having achieved remission: Secondary | ICD-10-CM

## 2019-05-30 DIAGNOSIS — I480 Paroxysmal atrial fibrillation: Secondary | ICD-10-CM | POA: Diagnosis not present

## 2019-05-30 DIAGNOSIS — I10 Essential (primary) hypertension: Secondary | ICD-10-CM | POA: Diagnosis not present

## 2019-05-30 DIAGNOSIS — I5032 Chronic diastolic (congestive) heart failure: Secondary | ICD-10-CM | POA: Diagnosis not present

## 2019-05-30 NOTE — Patient Instructions (Addendum)
Medication Instructions:  1) you may STOP ASPIRIN 11/08/2019  Follow-Up: Your provider recommends that you schedule a follow-up appointment in 3 months with Dr. Aundra Dubin. His office will call you to arrange an appointment.  Your 1 year TAVR echo and office visit is scheduled 05/07/2020. Please arrive by 1:45PM for these appointments.

## 2019-05-31 ENCOUNTER — Telehealth: Payer: Medicare Other | Admitting: Physician Assistant

## 2019-06-01 ENCOUNTER — Encounter (HOSPITAL_COMMUNITY): Payer: Self-pay | Admitting: Hematology

## 2019-06-01 ENCOUNTER — Inpatient Hospital Stay: Payer: Medicare Other

## 2019-06-01 ENCOUNTER — Other Ambulatory Visit: Payer: Self-pay

## 2019-06-01 VITALS — BP 156/76 | HR 54 | Temp 98.5°F | Resp 17

## 2019-06-01 DIAGNOSIS — C7951 Secondary malignant neoplasm of bone: Secondary | ICD-10-CM

## 2019-06-01 DIAGNOSIS — C9000 Multiple myeloma not having achieved remission: Secondary | ICD-10-CM

## 2019-06-01 DIAGNOSIS — R5383 Other fatigue: Secondary | ICD-10-CM | POA: Diagnosis not present

## 2019-06-01 DIAGNOSIS — Z5111 Encounter for antineoplastic chemotherapy: Secondary | ICD-10-CM | POA: Diagnosis not present

## 2019-06-01 DIAGNOSIS — I480 Paroxysmal atrial fibrillation: Secondary | ICD-10-CM | POA: Diagnosis not present

## 2019-06-01 DIAGNOSIS — Z7189 Other specified counseling: Secondary | ICD-10-CM

## 2019-06-01 DIAGNOSIS — M25559 Pain in unspecified hip: Secondary | ICD-10-CM | POA: Diagnosis not present

## 2019-06-01 DIAGNOSIS — R0602 Shortness of breath: Secondary | ICD-10-CM | POA: Diagnosis not present

## 2019-06-01 MED ORDER — PROCHLORPERAZINE MALEATE 10 MG PO TABS
10.0000 mg | ORAL_TABLET | Freq: Once | ORAL | Status: AC
  Administered 2019-06-01: 10 mg via ORAL

## 2019-06-01 MED ORDER — BORTEZOMIB CHEMO SQ INJECTION 3.5 MG (2.5MG/ML)
1.3000 mg/m2 | Freq: Once | INTRAMUSCULAR | Status: AC
  Administered 2019-06-01: 2.75 mg via SUBCUTANEOUS
  Filled 2019-06-01: qty 1.1

## 2019-06-01 MED ORDER — PROCHLORPERAZINE MALEATE 10 MG PO TABS
ORAL_TABLET | ORAL | Status: AC
  Filled 2019-06-01: qty 1

## 2019-06-01 MED ORDER — DEXAMETHASONE 4 MG PO TABS: 20.0000 mg | ORAL_TABLET | Freq: Once | ORAL | Status: DC

## 2019-06-04 ENCOUNTER — Telehealth (HOSPITAL_COMMUNITY): Payer: Self-pay | Admitting: Vascular Surgery

## 2019-06-04 ENCOUNTER — Telehealth: Payer: Self-pay | Admitting: Pharmacist

## 2019-06-04 NOTE — Telephone Encounter (Signed)
Oral Chemotherapy Pharmacist Encounter   I spoke with patient for overview of: Revlimid (lenalidomide) for the treatment of multiple myeloma with muttiple lytic bone lesions, not having achieved remission in conjunction with bortezomib and dexamethasone, planned duration usually 4-6 cycles for induction therapy  Counseled patient on administration, dosing, side effects, monitoring, drug-food interactions, safe handling, storage, and disposal.  Patient will take Revlimid '15mg'$  capsules, 1 capsule by mouth once daily, without regard to food, with a full glass of water.  Revlimid will be given 14 days on, 7 days off, repeat every 21 days.  Patient will take dexamethasone '4mg'$  tablets, 4 tablets ('20mg'$ ) by mouth once weekly with breakfast.  Revlimid start date: planned for 06/12/2019 (cycle 2 day 1)  Patient actually started taking Revlimid as soon as it arrived on 06/02/19, he will stop now and resume on 9/29 after he sees Dr. Irene Limbo Patient states he experienced increased lower extremity edema after starting Revlimid and had to take extra furosemide to to relive the swelling, it is almost resolved. He will discuss this further with Dr. Irene Limbo when in the office on 06/12/19  Adverse effects of Revlimid include but are not limited to: nausea, constipation, diarrhea, abdominal pain, rash, fatigue, drug fever, and decreased blood counts.    Reviewed with patient importance of keeping a medication schedule and plan for any missed doses.  Medication reconciliation performed and medication/allergy list updated.  Patient remains acyclovir and dexamethasone. Importance of these medications reviewed with patient. Patient counseled on importance of daily aspirin '81mg'$  for VTE prophylaxis in addition to patient's apixaban 5 mg twice daily. This combination was recommended by patient's cardiologist. Patient states he had stopped the aspirin, but will now resume it.  Patient instructed to stop prednisone for  polymyalgia rheumatica as these symptoms should be minimized by treatment for multiple myeloma. He will alert Dr. Irene Limbo if PMR symptoms are not controlled without the daily prednisone.  Insurance authorization for Revlimid has been obtained.  Revlimid prescription is being dispensed from [facility] as it is a limited distribution medication. Copayment ~$1800 for 1st 3 week supply of Revlimid. Patient's income is too high for grant foundation assistance or manufacturer assistance at this time. Patient is working with oral oncology patient advocate for acquisition issues.  All questions answered.  [name]  voiced understanding and appreciation.   Patient knows to call the office with questions or concerns.  Johny Drilling, PharmD, BCPS, BCOP  06/04/2019    12:12 PM Oral Oncology [facility] [phone]

## 2019-06-04 NOTE — Telephone Encounter (Signed)
Left pt message to make f/u appt w/ [facility] [place]

## 2019-06-04 NOTE — Telephone Encounter (Signed)
Oral Oncology Patient Advocate Encounter  I spoke to the patient today, he decided to pay for the Revlimid.  Revlimid was shipped on 9/17 and delivered to the patient on 9/18.  The patient started taking the Revlimid on 9/19. He stated that he found out from [employer] that his next copay would be a little over $500. He is upset about having to pay the copay, I explained that if his income drops at all or he spends the 3% of his income on medicines to please give me a call and I can see what we can do for him at that point.  The patient verbalized understanding and appreciation.  Amelia Patient [place] Phone [phone] Fax [phone] 06/04/2019   11:13 AM

## 2019-06-05 ENCOUNTER — Telehealth: Payer: Self-pay | Admitting: *Deleted

## 2019-06-05 ENCOUNTER — Encounter (HOSPITAL_COMMUNITY): Payer: Self-pay | Admitting: Hematology

## 2019-06-05 ENCOUNTER — Telehealth: Payer: Self-pay | Admitting: Pharmacist

## 2019-06-05 ENCOUNTER — Other Ambulatory Visit: Payer: Self-pay | Admitting: *Deleted

## 2019-06-05 DIAGNOSIS — C9000 Multiple myeloma not having achieved remission: Secondary | ICD-10-CM

## 2019-06-05 NOTE — Telephone Encounter (Signed)
Oral Oncology Pharmacist Encounter  Received voicemail  from patient with complaints of continued lower extremity edema. It is not relieved with furosemide. He also complains of new loss of feeling in lower extremity. Message forwarded to collaborative practice RN. MD and RN also alerted through secure chat. I returned call to patient and informed him that I have alerted provider for further guidance. Patient will await call from the office. He expressed appreciation for follow-up.  Johny Drilling, PharmD, BCPS, BCOP  06/05/2019 2:53 PM Oral Oncology Clinic [phone]

## 2019-06-05 NOTE — Telephone Encounter (Signed)
Contacted patient to f/u on reports of swelling in lower extremities, not relieved with lasix and new loss of sensation. Information given to Dr. Irene Limbo.  Dr. Irene Limbo would like patient to be seen in [facility] for evaluation tomorrow. He also advises patient to contact cardiologist. Contacted patient - Patient in agreement to come in 9/23 for evaluation in [facility] - schedule message sent. Patient states he will contact his cardiologist.

## 2019-06-06 ENCOUNTER — Other Ambulatory Visit: Payer: Self-pay

## 2019-06-06 ENCOUNTER — Encounter (HOSPITAL_COMMUNITY): Payer: Self-pay | Admitting: Cardiology

## 2019-06-06 ENCOUNTER — Inpatient Hospital Stay (HOSPITAL_BASED_OUTPATIENT_CLINIC_OR_DEPARTMENT_OTHER): Payer: Medicare Other | Admitting: Medical

## 2019-06-06 ENCOUNTER — Ambulatory Visit (HOSPITAL_COMMUNITY)
Admission: RE | Admit: 2019-06-06 | Discharge: 2019-06-06 | Disposition: A | Discharge status: Home / Self Care | Payer: Medicare Other | Source: Ambulatory Visit | Attending: Cardiology | Admitting: Cardiology

## 2019-06-06 ENCOUNTER — Inpatient Hospital Stay: Payer: Medicare Other

## 2019-06-06 VITALS — BP 142/78 | HR 45 | Wt 193.6 lb

## 2019-06-06 VITALS — BP 125/73 | HR 57 | Temp 98.0°F | Resp 16 | Ht 71.0 in | Wt 193.4 lb

## 2019-06-06 DIAGNOSIS — Z952 Presence of prosthetic heart valve: Secondary | ICD-10-CM | POA: Diagnosis not present

## 2019-06-06 DIAGNOSIS — E7800 Pure hypercholesterolemia, unspecified: Secondary | ICD-10-CM | POA: Insufficient documentation

## 2019-06-06 DIAGNOSIS — I5032 Chronic diastolic (congestive) heart failure: Secondary | ICD-10-CM

## 2019-06-06 DIAGNOSIS — R600 Localized edema: Secondary | ICD-10-CM

## 2019-06-06 DIAGNOSIS — Z79899 Other long term (current) drug therapy: Secondary | ICD-10-CM | POA: Insufficient documentation

## 2019-06-06 DIAGNOSIS — Z7901 Long term (current) use of anticoagulants: Secondary | ICD-10-CM | POA: Diagnosis not present

## 2019-06-06 DIAGNOSIS — D696 Thrombocytopenia, unspecified: Secondary | ICD-10-CM | POA: Insufficient documentation

## 2019-06-06 DIAGNOSIS — C9000 Multiple myeloma not having achieved remission: Secondary | ICD-10-CM | POA: Insufficient documentation

## 2019-06-06 DIAGNOSIS — I350 Nonrheumatic aortic (valve) stenosis: Secondary | ICD-10-CM | POA: Insufficient documentation

## 2019-06-06 DIAGNOSIS — I712 Thoracic aortic aneurysm, without rupture: Secondary | ICD-10-CM | POA: Diagnosis not present

## 2019-06-06 DIAGNOSIS — M25559 Pain in unspecified hip: Secondary | ICD-10-CM | POA: Diagnosis not present

## 2019-06-06 DIAGNOSIS — R5383 Other fatigue: Secondary | ICD-10-CM | POA: Diagnosis not present

## 2019-06-06 DIAGNOSIS — M545 Low back pain: Secondary | ICD-10-CM | POA: Diagnosis not present

## 2019-06-06 DIAGNOSIS — R0602 Shortness of breath: Secondary | ICD-10-CM | POA: Diagnosis present

## 2019-06-06 DIAGNOSIS — I480 Paroxysmal atrial fibrillation: Secondary | ICD-10-CM

## 2019-06-06 DIAGNOSIS — Z7952 Long term (current) use of systemic steroids: Secondary | ICD-10-CM | POA: Diagnosis not present

## 2019-06-06 DIAGNOSIS — R001 Bradycardia, unspecified: Secondary | ICD-10-CM | POA: Insufficient documentation

## 2019-06-06 DIAGNOSIS — Z8249 Family history of ischemic heart disease and other diseases of the circulatory system: Secondary | ICD-10-CM | POA: Diagnosis not present

## 2019-06-06 DIAGNOSIS — I110 Hypertensive heart disease with heart failure: Secondary | ICD-10-CM | POA: Insufficient documentation

## 2019-06-06 DIAGNOSIS — I5033 Acute on chronic diastolic (congestive) heart failure: Secondary | ICD-10-CM | POA: Diagnosis not present

## 2019-06-06 DIAGNOSIS — M1990 Unspecified osteoarthritis, unspecified site: Secondary | ICD-10-CM | POA: Insufficient documentation

## 2019-06-06 DIAGNOSIS — R9431 Abnormal electrocardiogram [ECG] [EKG]: Secondary | ICD-10-CM | POA: Insufficient documentation

## 2019-06-06 DIAGNOSIS — I359 Nonrheumatic aortic valve disorder, unspecified: Secondary | ICD-10-CM | POA: Diagnosis not present

## 2019-06-06 DIAGNOSIS — I2510 Atherosclerotic heart disease of native coronary artery without angina pectoris: Secondary | ICD-10-CM | POA: Insufficient documentation

## 2019-06-06 DIAGNOSIS — Z5111 Encounter for antineoplastic chemotherapy: Secondary | ICD-10-CM | POA: Diagnosis not present

## 2019-06-06 DIAGNOSIS — Z888 Allergy status to other drugs, medicaments and biological substances status: Secondary | ICD-10-CM | POA: Diagnosis not present

## 2019-06-06 DIAGNOSIS — Z7982 Long term (current) use of aspirin: Secondary | ICD-10-CM | POA: Insufficient documentation

## 2019-06-06 LAB — CMP (CANCER CENTER ONLY)
ALT: 26 U/L (ref 0–44)
AST: 23 U/L (ref 15–41)
Albumin: 3.9 g/dL (ref 3.5–5.0)
Alkaline Phosphatase: 88 U/L (ref 38–126)
Anion gap: 10 (ref 5–15)
BUN: 21 mg/dL (ref 8–23)
CO2: 30 mmol/L (ref 22–32)
Calcium: 8.5 mg/dL — ABNORMAL LOW (ref 8.9–10.3)
Chloride: 100 mmol/L (ref 98–111)
Creatinine: 0.98 mg/dL (ref 0.61–1.24)
GFR, Est AFR Am: >60 mL/min (ref >60)
GFR, Estimated: >60 mL/min (ref >60)
Glucose, Bld: 113 mg/dL — ABNORMAL HIGH (ref 70–99)
Potassium: 3.3 mmol/L — ABNORMAL LOW (ref 3.5–5.1)
Sodium: 140 mmol/L (ref 135–145)
Total Bilirubin: 0.7 mg/dL (ref 0.3–1.2)
Total Protein: 6.4 g/dL — ABNORMAL LOW (ref 6.5–8.1)

## 2019-06-06 LAB — CBC WITH DIFFERENTIAL (CANCER CENTER ONLY)
Abs Immature Granulocytes: 0.35 10*3/uL — ABNORMAL HIGH (ref 0.00–0.07)
Basophils Absolute: 0 10*3/uL (ref 0.0–0.1)
Basophils Relative: 1 %
Eosinophils Absolute: 0.2 10*3/uL (ref 0.0–0.5)
Eosinophils Relative: 3 %
HCT: 42.6 % (ref 39.0–52.0)
Hemoglobin: 14.1 g/dL (ref 13.0–17.0)
Immature Granulocytes: 4 %
Lymphocytes Relative: 9 %
Lymphs Abs: 0.7 10*3/uL (ref 0.7–4.0)
MCH: 30.1 pg (ref 26.0–34.0)
MCHC: 33.1 g/dL (ref 30.0–36.0)
MCV: 91 fL (ref 80.0–100.0)
Monocytes Absolute: 0.8 10*3/uL (ref 0.1–1.0)
Monocytes Relative: 10 %
Neutro Abs: 6 10*3/uL (ref 1.7–7.7)
Neutrophils Relative %: 73 %
Platelet Count: 38 10*3/uL — ABNORMAL LOW (ref 150–400)
RBC: 4.68 MIL/uL (ref 4.22–5.81)
RDW: 13.2 % (ref 11.5–15.5)
WBC Count: 8.1 10*3/uL (ref 4.0–10.5)
nRBC: 0.6 % — ABNORMAL HIGH (ref 0.0–0.2)

## 2019-06-06 LAB — CBC
HCT: 44.4 % (ref 39.0–52.0)
Hemoglobin: 14.6 g/dL (ref 13.0–17.0)
MCH: 30.9 pg (ref 26.0–34.0)
MCHC: 32.9 g/dL (ref 30.0–36.0)
MCV: 93.9 fL (ref 80.0–100.0)
Platelets: 38 10*3/uL — ABNORMAL LOW (ref 150–400)
RBC: 4.73 MIL/uL (ref 4.22–5.81)
RDW: 13.2 % (ref 11.5–15.5)
WBC: 8.4 10*3/uL (ref 4.0–10.5)
nRBC: 0.5 % — ABNORMAL HIGH (ref 0.0–0.2)

## 2019-06-06 MED ORDER — METOPROLOL SUCCINATE ER 25 MG PO TB24: 25.0000 mg | ORAL_TABLET | Freq: Two times a day (BID) | ORAL | 3 refills | Status: DC

## 2019-06-06 MED ORDER — FUROSEMIDE 20 MG PO TABS: 40.0000 mg | ORAL_TABLET | Freq: Every day | ORAL | 3 refills | Status: DC

## 2019-06-06 MED ORDER — SPIRONOLACTONE 25 MG PO TABS: 25.0000 mg | ORAL_TABLET | Freq: Every day | ORAL | 3 refills | Status: DC

## 2019-06-06 NOTE — Patient Instructions (Signed)
COVID-19: How to Protect Yourself and Others Know how it spreads  There is currently no vaccine to prevent coronavirus disease 2019 (COVID-19).  The best way to prevent illness is to avoid being exposed to this virus.  The virus is thought to spread mainly from person-to-person. ? Between people who are in close contact with one another (within about 6 feet). ? Through respiratory droplets produced when an infected person coughs, sneezes or talks. ? These droplets can land in the mouths or noses of people who are nearby or possibly be inhaled into the lungs. ? Some recent studies have suggested that COVID-19 may be spread by people who are not showing symptoms. Everyone should Clean your hands often  Wash your hands often with soap and water for at least 20 seconds especially after you have been in a public place, or after blowing your nose, coughing, or sneezing.  If soap and water are not readily available, use a hand sanitizer that contains at least 60% alcohol. Cover all surfaces of your hands and rub them together until they feel dry.  Avoid touching your eyes, nose, and mouth with unwashed hands. Avoid close contact  Stay home if you are sick.  Avoid close contact with people who are sick.  Put distance between yourself and other people. ? Remember that some people without symptoms may be able to spread virus. ? This is especially important for people who are at higher risk of getting very sick.www.cdc.gov/coronavirus/2019-ncov/need-extra-precautions/people-at-higher-risk.html Cover your mouth and nose with a cloth face cover when around others  You could spread COVID-19 to others even if you do not feel sick.  Everyone should wear a cloth face cover when they have to go out in public, for example to the grocery store or to pick up other necessities. ? Cloth face coverings should not be placed on young children under age 2, anyone who has trouble breathing, or is unconscious,  incapacitated or otherwise unable to remove the mask without assistance.  The cloth face cover is meant to protect other people in case you are infected.  Do NOT use a facemask meant for a healthcare worker.  Continue to keep about 6 feet between yourself and others. The cloth face cover is not a substitute for social distancing. Cover coughs and sneezes  If you are in a private setting and do not have on your cloth face covering, remember to always cover your mouth and nose with a tissue when you cough or sneeze or use the inside of your elbow.  Throw used tissues in the trash.  Immediately wash your hands with soap and water for at least 20 seconds. If soap and water are not readily available, clean your hands with a hand sanitizer that contains at least 60% alcohol. Clean and disinfect  Clean AND disinfect frequently touched surfaces daily. This includes tables, doorknobs, light switches, countertops, handles, desks, phones, keyboards, toilets, faucets, and sinks. www.cdc.gov/coronavirus/2019-ncov/prevent-getting-sick/disinfecting-your-home.html  If surfaces are dirty, clean them: Use detergent or soap and water prior to disinfection.  Then, use a household disinfectant. You can see a list of EPA-registered household disinfectants here. cdc.gov/coronavirus 01/16/2019 This information is not intended to replace advice given to you by your health care provider. Make sure you discuss any questions you have with your health care provider. Document Released: 12/26/2018 Document Revised: 01/24/2019 Document Reviewed: 12/26/2018 Elsevier Patient Education  2020 Elsevier Inc.  

## 2019-06-06 NOTE — Progress Notes (Signed)
Pt presents with +2 bilat lower leg pitting edema, with a fist-sized area of reddened, nonpainful, scaly skin on top of L foot.  Denies pain in legs but states that their weight can make him tired and makes it difficult to ambulate.  Hx HF and lasix use for edema, taking decadron for chemo tx.  Denies CP or SOB.  Pt states he is going to see his cardiologist soon for a f/u appt.  Denies loss of sensation or strength or ROM in either leg, pulses palpable bilat.

## 2019-06-06 NOTE — Progress Notes (Addendum)
Date:  06/06/2019  ID:  [name] [name], DOB 1940-09-30, MRN [id number]  Provider location:  Advanced Heart Failure Type of Visit: Established patient   PCP:  Cassandria Anger, MD  Cardiologist: Dr. Aundra Dubin  Chief Complaint: Shortness of breath   History of Present Illness: [name] [name] is a 78 y.o. male who has history of HTN, aortic stenosis and paroxysmal atrial fibrillation. He was hospitalized with atrial fibrillation/RVR in 5/14.  He had TEE-guided cardioversion.  TEE showed bicuspid aortic valve with mild AS and a moderately dilated ascending aorta.  Most echo in 4/17 showed moderate aortic stenosis and MRA chest in 11/17 showed 4.1 cm ascending aorta.   He was on Xarelto for anticoagulation but stopped it as he was convinced it was causing joint pains.  He then refused to start any other anticoagulation at that time.    In 5/17, he had been back in atrial fibrillation for several days and was symptomatic.  I started him on Eliquis and planned TEE-guided DCCV given significant symptoms, but he converted back to NSR on his own.  He continued Eliquis for about 1 month then stopped it on his own.    In 11/17, he was hospitalized with symptomatic atrial fibrillation with RVR.  I started him on diltiazem CD and Eliquis with plan for TEE-guided DCCV.  However, he converted back to NSR on his own. He stopped the diltiazem but has continued the Eliquis.   He was admitted in 2/18 with fever, LUL PNA and left-sided pleural effusion.  Thoracentesis on left was suggestive of parapneumonic effusion.  He was in the hospital 11 days.  During that time, he went into atrial fibrillation with RVR.  He was started on amiodarone and went back into NSR.  Amiodarone was subsequently stopped.   Recurrent atrial fibrillation with RVR in 2/19, felt more fatigued.  He underwent TEE-guided DCCV back to NSR.   He had a lower GI bleed in 11/19 post-polypectomy.  He has since restarted on  Eliquis.   Coronary CTA was done in 2/20, this showed mild nonobstructive CAD.  Echo in 2/20 showed EF 60-65%, bicuspid aortic valve with severe AS.   LHC in 6/20 showed no significant coronary disease.  In 8/20, he had TAVR with [place] 3 THV x 2 (valve in valve due to peri-valvular leak initially).  Post-procedure echoes have shown elevated gradient across the aortic valve though dimensionless index has only been in the mildly stenotic range.  Last echo in 9/20 showed mean aortic valve gradient 26 mmHg with dimensionless index 0.54.   Patient has additionally been diagnosed recently with multiple myeloma.  He is getting treated with Revlimid, Velcade, and prednisone.   Patient returns for followup of aortic stenosis and atrial fibrillation.  He is in NSR today.  He has noted HR in the 40s at times post-TAVR.  Initially after TAVR, he had RBBB with LAFB, but now the RBBB has resolved.  No lightheadedness or syncope.  For several weeks, he has noted increased lower extremity edema.  This has become uncomfortable and is making difficult for him to walk.  He is on amlodipine, but this was started AFTER the swelling had begun in his lower legs. He denies any significant exertional dyspnea, no orthopnea/PND.  No chest pain. CBC was done today showing markedly low platelets, 38K.    ECG (personally reviewed): sinus brady 53, anterolateral TWIs, QTc 480 msec   Labs (5/14): K 3.7, creatinine 0.9,  BNP 2261=>109, TSH normal Labs (7/14): K 3.5, creatinine 1.0 Labs (12/14): K 3.8, creatinine 1.0, LDL particle number 1374, LDL 107, TSH normal Labs (6/16): TSH normal, K 3.8, creatinine 0.84, HCT 42.4, LDL 85, LFTs normal Labs (3/17): K 3.8, creatinine 0.87 Labs (5/17): K 4.5, creatinine 0.97, HCT 43.7 Labs (11/17): K 3.2, creatinine 0.81, LDL 71, HDL 24 Labs (2/18): K 3.8, creatinine 0.89 Labs (2/19): K 3.7, creatinine 0.83, hgb 16 Labs (11/19): LDL 82 Labs (1/20): TSH elevated but free T4 normal,  K 4, creatinine 0.85, hgb 13.5 Labs (2/20): TSH mildly elevated but free T4 normal, K 4, creatinine 0.85 Labs (5/20): ESR 48 Labs (9/20): hgb 14.1, plts 38, K 3.3, creatinine 0.98  Allergies (verified):  No Known Drug Allergies   Past Medical History:  1. Hypertension: ACEI cough.  2. Atrial fibrillation. The patient had new-onset atrial fibrillation in November 2009. He underwent ibutilide cardioversion successfully. He has had 1-2 episodes/year that are short-lived that likely are atrial fibrillation with RVR. He was admitted in 5/14 with atrial fibrillation/RVR and had TEE-guided DCCV.  Atrial fibrillation again in 5/17, converted back to NSR spontaneously.  CHADSVASC score 2.  - Atrial fibrillation 2/19 with TEE-guided DCCV.  3. Hypercholesterolemia.  4. Bicuspid aortic valve disorder: Echo (7/11): EF 55-60%, mild LV hypertrophy, mild aortic stenosis with mean gradient 19 mmHg and peak gradient 36 mmHg.  TEE (5/14): EF 55%, mild LVH, bicuspid aortic valve with mild AS (mean gradient 13 mmHg), ascending aorta 4.5 cm.  Echo (9/15) with EF 65-70%, moderate AS (mean gradient 23 mmHg), mild AI, ascending aorta 4.1 cm, mild MR.   - Echo (4/17) with EF 65-70%, moderate aortic stenosis with mean gradient 27 mmHg, PASP 31 mmHg, ascending aorta 4.4 cm.  - Echo (2/18) with EF 55-60%, bicuspid aortic valve with moderate aortic stenosis (underestimated gradient).  - TEE (2/19): EF 60-65%, moderate LVH, bicuspid aortic valve with moderate AS with mean gradient 28 mmHg and AVA 1.2 cm^2, 4.4 cm ascending aorta.  - Echo (2/20): EF 60-65%, mild LVH, bicuspid aortic valve with severe AS (mean gradient 48 mmHg, AVA 0.8 cm^2).  - TAVR 8/20 with valve in valve [place] 3 THVs (because of peri-valvular leak after initial valve placed).  - Echo (9/20): EF > 65%, mild LVH, mild RV dilation with normal RV systolic function, mean aortic valve gradient 26 mmHg with dimensionless index 0.54, IVC normal.  5.  Osteoarthritis.  6. Low back pain.  7. Chest pain: ETT-myoview (12/11) with 10:51 exercise, no chest pain, no significant ST changes, EF 69%, no evidence for ischemia or infarction.  - Coronary CTA (2/20): calcium score 41, nonobstructive CAD.  - LHC (6/20): No significant CAD.  8. Ascending aortic aneurysm: Associated with bicuspid aortic valve.  4.5 cm by TEE in 5/14.   MRA chest (6/14) with bicuspid aortic valve, 4.3 cm ascending aortic aneurysm.  MRA chest (10/15) with 4.2 cm ascending aorta (bicuspid aortic valve noted).  Echo (4/17) with ascending aorta diameter 4.4 cm.  - MRA chest (11/17) with 4.1 cm ascending aorta.  - Echo (2/19): 4.4 cm ascending aorta.  - Coronary CTA in 2/20 showed 4.3 cm ascending aorta.  9. Colonic polyps: GI bleeding in 11/19 s/p polypectomy.  10. Multiple myeloma.  11. Bradycardia   Current Outpatient Medications  Medication Sig Dispense Refill  . acyclovir (ZOVIRAX) 400 MG tablet Take 1 tablet (400 mg total) by mouth 2 (two) times daily. 60 tablet 3  . amLODipine (NORVASC) 5  MG tablet Take 1 tablet (5 mg total) by mouth daily. 90 tablet 3  . apixaban (ELIQUIS) 5 MG TABS tablet Take 1 tablet (5 mg total) by mouth 2 (two) times daily. 60 tablet 6  . aspirin 81 MG chewable tablet Chew 1 tablet (81 mg total) by mouth daily.    [name] dexamethasone (DECADRON) 4 MG tablet Take 5 tablets (20 mg total) by mouth once a week. 20 tablet 6  . famotidine (PEPCID) 40 MG tablet Take 1 tablet (40 mg total) by mouth daily. 30 tablet 11  . furosemide (LASIX) 20 MG tablet Take 2 tablets (40 mg total) by mouth daily. 180 tablet 3  . HYDROcodone-acetaminophen (NORCO/VICODIN) 5-325 MG tablet Take 1 tablet by mouth every 6 (six) hours as needed for severe pain. 60 tablet 0  . lenalidomide (REVLIMID) 15 MG capsule Take 1 capsule (15 mg total) by mouth daily. Take for 14 days on, 7 days off, repeat every 21 days. [employer] # [id number], 05/18/2019 14 capsule 0  . loratadine (CLARITIN) 10 MG  tablet Take 1 tablet (10 mg total) by mouth daily. 30 tablet 11  . LORazepam (ATIVAN) 0.5 MG tablet Take 1 tablet (0.5 mg total) by mouth every 6 (six) hours as needed (Nausea or vomiting). 30 tablet 0  . metoprolol succinate (TOPROL-XL) 25 MG 24 hr tablet Take 1 tablet (25 mg total) by mouth 2 (two) times daily. Take with or immediately following a meal. 90 tablet 3  . ondansetron (ZOFRAN) 8 MG tablet Take 1 tablet (8 mg total) by mouth 2 (two) times daily as needed (Nausea or vomiting). 30 tablet 1  . predniSONE (DELTASONE) 5 MG tablet TAKE 3 TABLETS (15 MG TOTAL) BY MOUTH DAILY WITH BREAKFAST. 90 tablet 1  . prochlorperazine (COMPAZINE) 10 MG tablet Take 1 tablet (10 mg total) by mouth every 6 (six) hours as needed (Nausea or vomiting). 30 tablet 1  . Vitamin D, Cholecalciferol, 50 MCG (2000 UT) CAPS Take 2,000 Units by mouth daily.    [name] spironolactone (ALDACTONE) 25 MG tablet Take 1 tablet (25 mg total) by mouth daily. 90 tablet 3   No current facility-administered medications for this encounter.     Allergies:   Xarelto [rivaroxaban], Corticosteroids, Ramipril, and Benazepril   Social History:  The patient  reports that he has never smoked. He has never used smokeless tobacco. He reports current alcohol use. He reports that he does not use drugs.   Family History:  The patient's family history includes Colon cancer (age of onset: 85) in his mother; Hypertension in an other family member.   ROS:  Please see the history of present illness.   All other systems are personally reviewed and negative.   Exam:   BP (!) 142/78   Pulse (!) 45   Wt 87.8 kg (193 lb 9.6 oz)   SpO2 98%   BMI 27.00 kg/m  General: NAD Neck: JVP 8-9 cm, no thyromegaly or thyroid nodule.  Lungs: Clear to auscultation bilaterally with normal respiratory effort. CV: Nondisplaced PMI.  Heart regular S1/S2, no S3/S4, 2/6 SEM RUSB with clear S2.  2+ edema to knees bilaterally.  No carotid bruit.  Unable to palpate pedal  pulses due to edema.  Abdomen: Soft, nontender, no hepatosplenomegaly, no distention.  Skin: Intact without lesions or rashes.  Neurologic: Alert and oriented x 3.  Psych: Normal affect. Extremities: No clubbing or cyanosis.  HEENT: Normal.    Recent Labs: 10/03/2018: TSH 4.76 05/04/2019: B Natriuretic Peptide 1,279.6  05/09/2019: Magnesium 1.5 06/06/2019: ALT 26; BUN 21; Creatinine 0.98; Hemoglobin 14.6; Platelets 38; Potassium 3.3; Sodium 140  Personally reviewed   Wt Readings from Last 3 Encounters:  06/06/19 87.8 kg (193 lb 9.6 oz)  06/06/19 87.7 kg (193 lb 6.4 oz)  05/30/19 87.1 kg (192 lb)      ASSESSMENT AND PLAN:  1. Thrombocytopenia: New, platelets 38K today. Suspect this is related to his multiple myeloma treatment.  - With platelets this low, I think that we will need to stop ASA and apixaban for now.  I will recheck CBC Friday then see him again next week.  2. Atrial fibrillation: Paroxysmal. He is quite symptomatic when in atrial fibrillation.  CHADSVASC = 3.  He has not had recent palpitations.  He is in NSR today.  - As above, holding Eliquis with low platelets.  - He has not wanted atrial fibrillation ablation.  - Cutting back on Toprol XL (see below).  3. Bradycardia: After TAVR, patient had RBBB and LAFB.  RBBB has resolved, but HR still dropping to the 40s frequently per patient.  - I will place Zio patch x 3 days to look for significant conduction disease/clinically significant bradycardia.  - Decrease Toprol XL to 25 mg daily.   4. HTN: BP mildly elevated today.   - As I am decreasing Toprol XL, I will start spironolactone 25 mg daily (will help keep up K since I am increasing Lasix as well).  - Continue amlodipine 5 mg daily.  I do not think this is causing his peripheral edema as edema was present before he started amlodipine.   5. Bicuspid aortic valve disorder: Ascending aorta was 4.3 cm on recent coronary CTA. He is now s/p TAVR with 2 [place]  (valve-in-valve due to peri-valvular leak after 1st valve placed). Post-op, mean gradient across the aortic valve has been elevated, most recently 26 mmHg in 9/20.  Dimensionless index, most recently 0.54, only suggests mild bioprosthetic aortic stenosis.  I suspect the elevated gradient is due to a combination of high flow and patient-prosthetic mismatch due to valve-in-valve placement.  - He has been on ASA 81 + Eliquis post-TAVR, will be holding them both for the time being with very low platelets, hopefully can restart soon.  6. Chronic diastolic CHF: He appears volume overloaded on exam, prominent LE edema which makes it difficult to walk.  - He will wear graded compression stockings.  - Start Lasix 40 mg daily.  - Start spironolactone 25 mg daily.   - BMET next week. 7. Multiple myeloma: He is on Revlimid, prednisone, and Velcade per Dr. Irene Limbo.   I will have him return to see me next week.   Signed, [name], MD  06/06/2019  [place] [address] [facility] [place] [address] [phone] (office) [phone] (fax)

## 2019-06-06 NOTE — Addendum Note (Signed)
Encounter addended by: Larey Dresser, MD on: 06/06/2019 10:16 PM  Actions taken: Clinical Note Signed

## 2019-06-06 NOTE — Patient Instructions (Addendum)
Hold aspirin and eliquis until platelets increase.  Increase Lasix to 40mg  daily.  Start Spironolactone 25mg  daily.  Decrease Toprol XL to 25mg  daily.  Routine lab work today. Will notify you of abnormal results  Repeat labs on Friday (CBC, BMET)   Your provider has ordered a Zio patch.  Please wear compression stockings.    Follow up in next week.

## 2019-06-06 NOTE — Progress Notes (Signed)
[facility] Progress Note   [name] [name] [id number] [birth year] 78 y.o.  [name] [name] is managed by Dr. Sullivan Lone  Actively treated with chemotherapy/immunotherapy/hormonal therapy: yes  Current therapy: Velcade and Revlimid  Next scheduled appointment with provider: 06/12/2019  Assessment: Plan:    Multiple myeloma not having achieved remission ([place])  Localized edema  S/P TAVR (transcatheter aortic valve replacement)  Severe aortic stenosis  Acute on chronic diastolic heart failure ([place])   Multiple myeloma: The patient continues to be followed by Dr. Sullivan Lone and is treated with Velcade and Revlimid.  He is scheduled to return for follow-up on 06/12/2019.  Bilateral lower extremity edema with presacral edema in a setting of valve replacement surgery x2 and a history of acute on chronic diastolic heart failure: The patient's labs were reviewed with him today.  They do not show anemia, renal dysfunction, or hypoalbuminemia.  The patient was instructed to follow-up with his cardiologist today.  Please see After Visit Summary for patient specific instructions.  Future Appointments  Date Time Provider [place]  06/08/2019 12:15 PM [facility] LAB [facility] None  06/11/2019 11:40 AM Larey Dresser, MD [facility] None  06/12/2019  8:15 AM [facility] LAB 1 [facility] None  06/12/2019  8:40 AM Brunetta Genera, MD [facility] None  06/12/2019 10:15 AM [facility] INFUSION [facility] None  06/15/2019  9:00 AM [facility] INFUSION [facility] None  06/19/2019  8:45 AM [facility] LAB 2 [facility] None  06/19/2019  9:15 AM [facility] INFUSION [facility] None  06/22/2019  9:30 AM [facility] INFUSION [facility] None  07/03/2019 10:00 AM [facility] LAB 1 [facility] None  07/03/2019 10:40 AM Brunetta Genera, MD [facility] None  07/03/2019 11:30 AM [facility] INFUSION [facility] None  07/06/2019  9:15 AM [facility] INFUSION [facility] None    07/10/2019  9:15 AM [facility] LAB 2 [facility] None  07/10/2019 10:00 AM [facility] INFUSION [facility] None  07/13/2019  9:30 AM [facility] INFUSION [facility] None  08/16/2019  1:40 PM Larey Dresser, MD [facility] None  05/07/2020  2:05 PM [facility] ECHO 5 [id number]  05/07/2020  3:00 PM Eileen Stanford, PA-C [facility]    No orders of the defined types were placed in this encounter.      Subjective:   Patient ID:  [name] [name] is a 78 y.o. (DOB November 30, 1940) male.  Chief Complaint:  Chief Complaint  Patient presents with   Edema    HPI [name] [name] Is a 78 y.o. male with a diagnosis of multiple myeloma.  He is managed by Dr. Sullivan Lone and is treated with Velcade and Revlimid.  He has a history of valvular dysfunction and has had a replacement of 2 heart valves.  He also has a history of acute on chronic congestive heart failure.  He presents to the office today with a 3 to 4-day history of bilateral pitting edema.  He is unable to get on shoes and is having to wear sandals.  He has a sore on his left dorsal foot from his edema.  He reports he is having difficulty walking due to swelling and soreness in his feet.  He reports that he has been up urinating approximately every hour for the past 1-2 nights.  He is also losing feeling in his feet he is off balance.  He is taking Lasix.  He is also on steroids as part of his treatment plan for his multiple myeloma.  Medications: I have reviewed the patient's current medications.  Allergies:  Allergies  Allergen Reactions   Xarelto [Rivaroxaban] Other (See Comments) and Hypertension    INCREASED BP-HYPERTENSIVE EVENTS   Corticosteroids Other (See Comments)    Made the patient  "sick," feel "weird," and his "body rejected" them    Ramipril Other (See Comments)    Could not eat or sleep, lost muscle mass   Benazepril Cough    Past Medical History:  Diagnosis Date   Acute blood loss  anemia    Adenomatous colon polyp    Ascending aortic aneurysm (HCC)    Bicuspid aortic valve    Chest pain    ETT-myoview 12/11 w/exercise, no chest pain, no significant ST changes, EF 69%, no evidence for ischemia or infarction.   CHF NYHA class I (no symptoms from ordinary activities), acute, diastolic (HCC)    Fatty liver    mild   GI bleed    GI bleeding 07/21/2018   post polypectomy   Hemorrhoids    HTN (hypertension)    Hypercholesteremia    Hypokalemia    Internal hemorrhoids    LBP (low back pain)    Moderate aortic stenosis    Osteoarthritis    Paroxysmal atrial fibrillation ([place])    a. new onset Afib in 07/2008. He underwent ibutilide cardioversion successfully. b. Recurrence 01/2013 s/p TEE/DCCV - was on Xarelto but he stopped it as he was convinced it was causing joint pn. c. Recurrence 01/2016 - spont conv to NSR. Pt took Eliquis x1 mo then declined further anticoag. d. Recurrence 07/2016.   Pneumonia    Tubular adenoma of colon     Past Surgical History:  Procedure Laterality Date   BACK SURGERY  x12 years ago   CARDIOVERSION N/A 01/26/2013   Procedure: CARDIOVERSION;  Surgeon: Larey Dresser, MD;  Location: [place];  Service: Cardiovascular;  Laterality: N/A;   CARDIOVERSION N/A 10/28/2017   Procedure: CARDIOVERSION;  Surgeon: Larey Dresser, MD;  [facility] ENDOSCOPY;  Service: Cardiovascular;  Laterality: N/A;   CARDIOVERSION N/A 03/03/2018   Procedure: CARDIOVERSION;  Surgeon: Lelon Perla, MD;  Location[facility] ENDOSCOPY;  Service: Cardiovascular;  Laterality: N/A;   COLONOSCOPY     COLONOSCOPY  07/17/2018   at [place]     RIGHT/LEFT HEART CATH AND CORONARY ANGIOGRAPHY N/A 03/07/2019   Procedure: RIGHT/LEFT HEART CATH AND CORONARY ANGIOGRAPHY;  Surgeon: Burnell Blanks, MD;  Location: [place] CV LAB;  Service: Cardiovascular;  Laterality: N/A;   TEE  WITHOUT CARDIOVERSION N/A 01/26/2013   Procedure: TRANSESOPHAGEAL ECHOCARDIOGRAM (TEE);  Surgeon: Larey Dresser, MD;  Location: [place];  Service: Cardiovascular;  Laterality: N/A;   TEE WITHOUT CARDIOVERSION N/A 10/28/2017   Procedure: TRANSESOPHAGEAL ECHOCARDIOGRAM (TEE);  Surgeon: Larey Dresser, MD;  Location: [facility] ENDOSCOPY;  Service: Cardiovascular;  Laterality: N/A;   TEE WITHOUT CARDIOVERSION N/A 05/08/2019   Procedure: TRANSESOPHAGEAL ECHOCARDIOGRAM (TEE);  Surgeon: Burnell Blanks, MD;  Location: [place] CV LAB;  Service: Open Heart Surgery;  Laterality: N/A;   TRANSCATHETER AORTIC VALVE REPLACEMENT, TRANSFEMORAL N/A 05/08/2019   Procedure: TRANSCATHETER AORTIC VALVE REPLACEMENT, TRANSFEMORAL;  Surgeon: Burnell Blanks, MD;  Location: [place] CV LAB;  Service: Open Heart Surgery;  Laterality: N/A;    Family History  Problem Relation Age of Onset   Colon cancer Mother 73   Hypertension Other    Coronary artery disease Neg Hx    Colon polyps Neg Hx    Esophageal  cancer Neg Hx    Rectal cancer Neg Hx    Stomach cancer Neg Hx     Social History   Socioeconomic History   Marital status: Married    Spouse name: Not on file   Number of children: 0   Years of education: Not on file   Highest education level: Not on file  Occupational History   Occupation: Retired Lobbyist: [employer] resource strain: Not on file   Food insecurity    Worry: Not on file    Inability: Not on file   Transportation needs    Medical: Not on file    Non-medical: Not on file  Tobacco Use   Smoking status: Never Smoker   Smokeless tobacco: Never Used  Substance and Sexual Activity   Alcohol use: Yes    Comment: Drinks 1 glass of wine nightly/socially   Drug use: No   Sexual activity: Yes  Lifestyle   Physical activity    Days per week: Not on file    Minutes per session: Not on file    Stress: Not on file  Relationships   Social connections    Talks on phone: Not on file    Gets together: Not on file    Attends religious service: Not on file    Active member of club or organization: Not on file    Attends meetings of clubs or organizations: Not on file    Relationship status: Not on file   Intimate partner violence    Fear of current or ex partner: Not on file    Emotionally abused: Not on file    Physically abused: Not on file    Forced sexual activity: Not on file  Other Topics Concern   Not on file  Social History Narrative   Patient lives in [place] w/ his wife. He is a native of [place]. 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   Right-handed   Caffeine: 2 cups coffee per day    Past Medical History, Surgical history, Social history, and Family history were reviewed and updated as appropriate.   Please see review of systems for further details on the patient's review from today.   Review of Systems:  Review of Systems  Constitutional: Negative for chills, diaphoresis and fever.  HENT: Negative for trouble swallowing and voice change.   Respiratory: Negative for cough, chest tightness, shortness of breath and wheezing.   Cardiovascular: Positive for leg swelling. Negative for chest pain and palpitations.  Gastrointestinal: Negative for abdominal pain, constipation, diarrhea, nausea and vomiting.  Genitourinary: Positive for frequency.  Musculoskeletal: Positive for gait problem. Negative for back pain and myalgias.  Neurological: Negative for dizziness, light-headedness and headaches.    Objective:   Physical Exam:  BP 125/73 (BP Location: Left Arm, Patient Position: Sitting)    Pulse (!) 57    Temp 98 F (36.7 C) (Oral)    Resp 16    Ht 5' 11" (1.803 m)    Wt 193 lb 6.4 oz (87.7 kg)    SpO2 100%    BMI 26.97 kg/m  ECOG: 1  Physical Exam Constitutional:      General: He is not in acute distress.     Appearance: He is not diaphoretic.  HENT:     Head: Normocephalic and atraumatic.  Cardiovascular:     Rate and Rhythm: Normal rate and  regular rhythm.     Heart sounds: Normal heart sounds. No murmur. No friction rub. No gallop.      Comments: Trace presacral edema. Pulmonary:     Effort: Pulmonary effort is normal. No respiratory distress.     Breath sounds: Normal breath sounds. No wheezing or rales.  Musculoskeletal:     Right lower leg: Edema present.     Left lower leg: Edema present.     Comments: 2+ lower extremity edema to the lower one half bilaterally.  There is an area of erythema on the dorsal surface of the left foot.  Skin:    General: Skin is warm and dry.     Findings: Erythema present. No rash.  Neurological:     Mental Status: He is alert.     Coordination: Coordination normal.     Gait: Gait normal.  Psychiatric:        Mood and Affect: Mood normal.        Behavior: Behavior normal.        Thought Content: Thought content normal.        Judgment: Judgment normal.     Lab Review:     Component Value Date/Time   NA 140 06/06/2019 0844   NA 141 03/01/2019 0934   K 3.3 (L) 06/06/2019 0844   CL 100 06/06/2019 0844   CO2 30 06/06/2019 0844   GLUCOSE 113 (H) 06/06/2019 0844   BUN 21 06/06/2019 0844   BUN 17 03/01/2019 0934   CREATININE 0.98 06/06/2019 0844   CREATININE 0.81 09/10/2016 1101   CALCIUM 8.5 (L) 06/06/2019 0844   PROT 6.4 (L) 06/06/2019 0844   ALBUMIN 3.9 06/06/2019 0844   AST 23 06/06/2019 0844   ALT 26 06/06/2019 0844   ALKPHOS 88 06/06/2019 0844   BILITOT 0.7 06/06/2019 0844   GFRNONAA >60 06/06/2019 0844   GFRAA >60 06/06/2019 0844       Component Value Date/Time   WBC 8.4 06/06/2019 1151   RBC 4.73 06/06/2019 1151   HGB 14.6 06/06/2019 1151   HGB 14.1 06/06/2019 0844   HGB 12.3 (L) 03/01/2019 0934   HCT 44.4 06/06/2019 1151   HCT 35.3 (L) 03/01/2019 0934   PLT 38 (L) 06/06/2019 1151   PLT 38 (L) 06/06/2019 0844   PLT 175  03/01/2019 0934   MCV 93.9 06/06/2019 1151   MCV 92 03/01/2019 0934   MCH 30.9 06/06/2019 1151   MCHC 32.9 06/06/2019 1151   RDW 13.2 06/06/2019 1151   RDW 13.4 03/01/2019 0934   LYMPHSABS 0.7 06/06/2019 0844   MONOABS 0.8 06/06/2019 0844   EOSABS 0.2 06/06/2019 0844   BASOSABS 0.0 06/06/2019 0844   -------------------------------  Imaging from last 24 hours (if applicable):  Radiology interpretation: Ct Biopsy  Result Date: 05/24/2019 INDICATION: 78 year old with multiple myeloma.  Request for bone marrow biopsy. EXAM: CT GUIDED BONE MARROW ASPIRATES AND BIOPSY Physician: Stephan Minister. Anselm Pancoast, MD MEDICATIONS: None. ANESTHESIA/SEDATION: Fentanyl 50 mcg IV; Versed 2.0 mg IV Moderate Sedation Time:  10 minutes The patient was continuously monitored during the procedure by the interventional radiology nurse under my direct supervision. COMPLICATIONS: None immediate. PROCEDURE: The procedure was explained to the patient. The risks and benefits of the procedure were discussed and the patient's questions were addressed. Informed consent was obtained from the patient. The patient was placed prone on CT table. Images of the pelvis were obtained. The right side of back was prepped and draped in sterile fashion. The skin and right  posterior ilium were anesthetized with 1% lidocaine. 11 gauge bone needle was directed into the right ilium with CT guidance. Two aspirates and one core biopsy were obtained. Bandage placed over the puncture site. IMPRESSION: CT guided bone marrow aspiration and core biopsy. Electronically Signed   By: Markus Daft M.D.   On: 05/24/2019 11:19   Dg Chest Port 1 View  Result Date: 05/09/2019 CLINICAL DATA:  High fever and malaise. EXAM: PORTABLE CHEST 1 VIEW COMPARISON:  Single-view of the chest 05/08/2019. PA and lateral chest 05/04/2019. FINDINGS: There is cardiomegaly without edema. No consolidative process, pneumothorax or effusion. Atherosclerosis and aortic valve replacement noted.  IMPRESSION: No acute disease. Electronically Signed   By: Inge Rise M.D.   On: 05/09/2019 15:38   Dg Chest Port 1 View  Result Date: 05/08/2019 CLINICAL DATA:  Status post TAVR EXAM: PORTABLE CHEST 1 VIEW COMPARISON:  05/04/2019 FINDINGS: Bilateral diffuse interstitial thickening. No pleural effusion or pneumothorax. Stable cardiomediastinal silhouette. Interval TAVR. No acute osseous abnormality. IMPRESSION: Interval TAVR. Bilateral interstitial thickening likely reflecting mild interstitial edema. Electronically Signed   By: Kathreen Devoid   On: 05/08/2019 14:01   Ct Bone Marrow Biopsy & Aspiration  Result Date: 05/24/2019 INDICATION: 78 year old with multiple myeloma.  Request for bone marrow biopsy. EXAM: CT GUIDED BONE MARROW ASPIRATES AND BIOPSY Physician: Stephan Minister. Anselm Pancoast, MD MEDICATIONS: None. ANESTHESIA/SEDATION: Fentanyl 50 mcg IV; Versed 2.0 mg IV Moderate Sedation Time:  10 minutes The patient was continuously monitored during the procedure by the interventional radiology nurse under my direct supervision. COMPLICATIONS: None immediate. PROCEDURE: The procedure was explained to the patient. The risks and benefits of the procedure were discussed and the patient's questions were addressed. Informed consent was obtained from the patient. The patient was placed prone on CT table. Images of the pelvis were obtained. The right side of back was prepped and draped in sterile fashion. The skin and right posterior ilium were anesthetized with 1% lidocaine. 11 gauge bone needle was directed into the right ilium with CT guidance. Two aspirates and one core biopsy were obtained. Bandage placed over the puncture site. IMPRESSION: CT guided bone marrow aspiration and core biopsy. Electronically Signed   By: Markus Daft M.D.   On: 05/24/2019 11:19

## 2019-06-07 ENCOUNTER — Telehealth (HOSPITAL_COMMUNITY): Payer: Self-pay

## 2019-06-07 ENCOUNTER — Telehealth: Payer: Self-pay | Admitting: Medical

## 2019-06-07 NOTE — Telephone Encounter (Signed)
No los per 9/23.

## 2019-06-07 NOTE — Telephone Encounter (Signed)
-----   Message from Larey Dresser, MD sent at 06/06/2019  4:35 PM EDT ----- Platelets truly low, need to hold ASA and apixaban for now.

## 2019-06-07 NOTE — Telephone Encounter (Signed)
Pt aware of results. Pt has already been holding asa and apixaban.  Advised to continue to hold at least until f/u.  Pt has f/u with MD on 9/28.

## 2019-06-08 ENCOUNTER — Other Ambulatory Visit: Payer: Self-pay

## 2019-06-08 ENCOUNTER — Ambulatory Visit (HOSPITAL_COMMUNITY)
Admission: RE | Admit: 2019-06-08 | Discharge: 2019-06-08 | Disposition: A | Discharge status: Home / Self Care | Payer: Medicare Other | Source: Ambulatory Visit | Attending: Cardiology | Admitting: Cardiology

## 2019-06-08 ENCOUNTER — Telehealth (HOSPITAL_COMMUNITY): Payer: Self-pay

## 2019-06-08 DIAGNOSIS — I480 Paroxysmal atrial fibrillation: Secondary | ICD-10-CM | POA: Insufficient documentation

## 2019-06-08 LAB — CBC
HCT: 42.2 % (ref 39.0–52.0)
Hemoglobin: 13.8 g/dL (ref 13.0–17.0)
MCH: 31 pg (ref 26.0–34.0)
MCHC: 32.7 g/dL (ref 30.0–36.0)
MCV: 94.8 fL (ref 80.0–100.0)
Platelets: 82 10*3/uL — ABNORMAL LOW (ref 150–400)
RBC: 4.45 MIL/uL (ref 4.22–5.81)
RDW: 13.4 % (ref 11.5–15.5)
WBC: 9.7 10*3/uL (ref 4.0–10.5)
nRBC: 0 % (ref 0.0–0.2)

## 2019-06-08 LAB — BASIC METABOLIC PANEL
Anion gap: 9 (ref 5–15)
BUN: 18 mg/dL (ref 8–23)
CO2: 26 mmol/L (ref 22–32)
Calcium: 8.7 mg/dL — ABNORMAL LOW (ref 8.9–10.3)
Chloride: 102 mmol/L (ref 98–111)
Creatinine, Ser: 1.06 mg/dL (ref 0.61–1.24)
GFR calc Af Amer: >60 mL/min (ref >60)
GFR calc non Af Amer: >60 mL/min (ref >60)
Glucose, Bld: 93 mg/dL (ref 70–99)
Potassium: 3.5 mmol/L (ref 3.5–5.1)
Sodium: 137 mmol/L (ref 135–145)

## 2019-06-08 NOTE — Telephone Encounter (Signed)
-----   Message from Larey Dresser, MD sent at 06/08/2019  4:33 PM EDT ----- Platelets are better.  He can restart on Eliquis.

## 2019-06-08 NOTE — Telephone Encounter (Signed)
Detailed message left with lab results and to restart eliquis.  Number left on vm to call office if he has any questions.

## 2019-06-11 ENCOUNTER — Other Ambulatory Visit: Payer: Self-pay

## 2019-06-11 ENCOUNTER — Encounter (HOSPITAL_COMMUNITY): Payer: Self-pay | Admitting: Cardiology

## 2019-06-11 ENCOUNTER — Ambulatory Visit (HOSPITAL_COMMUNITY)
Admission: RE | Admit: 2019-06-11 | Discharge: 2019-06-11 | Disposition: A | Discharge status: Home / Self Care | Payer: Medicare Other | Source: Ambulatory Visit | Attending: Cardiology | Admitting: Cardiology

## 2019-06-11 VITALS — BP 138/64 | HR 95 | Wt 194.8 lb

## 2019-06-11 DIAGNOSIS — I2510 Atherosclerotic heart disease of native coronary artery without angina pectoris: Secondary | ICD-10-CM | POA: Diagnosis not present

## 2019-06-11 DIAGNOSIS — Z7901 Long term (current) use of anticoagulants: Secondary | ICD-10-CM | POA: Insufficient documentation

## 2019-06-11 DIAGNOSIS — Z7952 Long term (current) use of systemic steroids: Secondary | ICD-10-CM | POA: Diagnosis not present

## 2019-06-11 DIAGNOSIS — Z7982 Long term (current) use of aspirin: Secondary | ICD-10-CM | POA: Diagnosis not present

## 2019-06-11 DIAGNOSIS — I480 Paroxysmal atrial fibrillation: Secondary | ICD-10-CM | POA: Diagnosis not present

## 2019-06-11 DIAGNOSIS — I491 Atrial premature depolarization: Secondary | ICD-10-CM | POA: Insufficient documentation

## 2019-06-11 DIAGNOSIS — Q231 Congenital insufficiency of aortic valve: Secondary | ICD-10-CM | POA: Insufficient documentation

## 2019-06-11 DIAGNOSIS — I359 Nonrheumatic aortic valve disorder, unspecified: Secondary | ICD-10-CM | POA: Diagnosis not present

## 2019-06-11 DIAGNOSIS — Z79899 Other long term (current) drug therapy: Secondary | ICD-10-CM | POA: Insufficient documentation

## 2019-06-11 DIAGNOSIS — C9000 Multiple myeloma not having achieved remission: Secondary | ICD-10-CM | POA: Insufficient documentation

## 2019-06-11 DIAGNOSIS — Z8249 Family history of ischemic heart disease and other diseases of the circulatory system: Secondary | ICD-10-CM | POA: Diagnosis not present

## 2019-06-11 DIAGNOSIS — I4510 Unspecified right bundle-branch block: Secondary | ICD-10-CM | POA: Diagnosis not present

## 2019-06-11 DIAGNOSIS — Z888 Allergy status to other drugs, medicaments and biological substances status: Secondary | ICD-10-CM | POA: Insufficient documentation

## 2019-06-11 DIAGNOSIS — M1990 Unspecified osteoarthritis, unspecified site: Secondary | ICD-10-CM | POA: Diagnosis not present

## 2019-06-11 DIAGNOSIS — Z952 Presence of prosthetic heart valve: Secondary | ICD-10-CM | POA: Diagnosis not present

## 2019-06-11 DIAGNOSIS — I110 Hypertensive heart disease with heart failure: Secondary | ICD-10-CM | POA: Diagnosis not present

## 2019-06-11 DIAGNOSIS — I459 Conduction disorder, unspecified: Secondary | ICD-10-CM

## 2019-06-11 DIAGNOSIS — D696 Thrombocytopenia, unspecified: Secondary | ICD-10-CM | POA: Diagnosis not present

## 2019-06-11 DIAGNOSIS — R9431 Abnormal electrocardiogram [ECG] [EKG]: Secondary | ICD-10-CM | POA: Insufficient documentation

## 2019-06-11 DIAGNOSIS — R21 Rash and other nonspecific skin eruption: Secondary | ICD-10-CM | POA: Diagnosis not present

## 2019-06-11 DIAGNOSIS — I5032 Chronic diastolic (congestive) heart failure: Secondary | ICD-10-CM | POA: Insufficient documentation

## 2019-06-11 DIAGNOSIS — Z800 Family history of malignant neoplasm of digestive organs: Secondary | ICD-10-CM | POA: Diagnosis not present

## 2019-06-11 LAB — CBC
HCT: 41 % (ref 39.0–52.0)
Hemoglobin: 13 g/dL (ref 13.0–17.0)
MCH: 30.6 pg (ref 26.0–34.0)
MCHC: 31.7 g/dL (ref 30.0–36.0)
MCV: 96.5 fL (ref 80.0–100.0)
Platelets: 174 10*3/uL (ref 150–400)
RBC: 4.25 MIL/uL (ref 4.22–5.81)
RDW: 13.4 % (ref 11.5–15.5)
WBC: 7 10*3/uL (ref 4.0–10.5)
nRBC: 0 % (ref 0.0–0.2)

## 2019-06-11 LAB — BASIC METABOLIC PANEL
Anion gap: 13 (ref 5–15)
BUN: 20 mg/dL (ref 8–23)
CO2: 19 mmol/L — ABNORMAL LOW (ref 22–32)
Calcium: 8.7 mg/dL — ABNORMAL LOW (ref 8.9–10.3)
Chloride: 106 mmol/L (ref 98–111)
Creatinine, Ser: 1.17 mg/dL (ref 0.61–1.24)
GFR calc Af Amer: >60 mL/min (ref >60)
GFR calc non Af Amer: 59 mL/min — ABNORMAL LOW (ref >60)
Glucose, Bld: 120 mg/dL — ABNORMAL HIGH (ref 70–99)
Potassium: 4.1 mmol/L (ref 3.5–5.1)
Sodium: 138 mmol/L (ref 135–145)

## 2019-06-11 LAB — BRAIN NATRIURETIC PEPTIDE: B Natriuretic Peptide: 508.3 pg/mL — ABNORMAL HIGH (ref 0.0–100.0)

## 2019-06-11 MED ORDER — FUROSEMIDE 20 MG PO TABS: 60.0000 mg | ORAL_TABLET | Freq: Every day | ORAL | 1 refills | Status: DC

## 2019-06-11 NOTE — Progress Notes (Signed)
Date:  06/11/2019  ID:  Alwyn Cordner, DOB 17-Oct-1940, MRN [id number]  Provider location: [facility] Type of Visit: Established patient   PCP:  Cassandria Anger, MD  Cardiologist: Dr. Aundra Dubin  Chief Complaint: Shortness of breath   History of Present Illness: [name] [name] is a 78 y.o. male who has history of HTN, aortic stenosis and paroxysmal atrial fibrillation. He was hospitalized with atrial fibrillation/RVR in 5/14.  He had TEE-guided cardioversion.  TEE showed bicuspid aortic valve with mild AS and a moderately dilated ascending aorta.  Most echo in 4/17 showed moderate aortic stenosis and MRA chest in 11/17 showed 4.1 cm ascending aorta.   He was on Xarelto for anticoagulation but stopped it as he was convinced it was causing joint pains.  He then refused to start any other anticoagulation at that time.    In 5/17, he had been back in atrial fibrillation for several days and was symptomatic.  I started him on Eliquis and planned TEE-guided DCCV given significant symptoms, but he converted back to NSR on his own.  He continued Eliquis for about 1 month then stopped it on his own.    In 11/17, he was hospitalized with symptomatic atrial fibrillation with RVR.  I started him on diltiazem CD and Eliquis with plan for TEE-guided DCCV.  However, he converted back to NSR on his own. He stopped the diltiazem but has continued the Eliquis.   He was admitted in 2/18 with fever, LUL PNA and left-sided pleural effusion.  Thoracentesis on left was suggestive of parapneumonic effusion.  He was in the hospital 11 days.  During that time, he went into atrial fibrillation with RVR.  He was started on amiodarone and went back into NSR.  Amiodarone was subsequently stopped.   Recurrent atrial fibrillation with RVR in 2/19, felt more fatigued.  He underwent TEE-guided DCCV back to NSR.   He had a lower GI bleed in 11/19 post-polypectomy.  He has since restarted on  Eliquis.   Coronary CTA was done in 2/20, this showed mild nonobstructive CAD.  Echo in 2/20 showed EF 60-65%, bicuspid aortic valve with severe AS.   LHC in 6/20 showed no significant coronary disease.  In 8/20, he had TAVR with [place] 3 THV x 2 (valve in valve due to peri-valvular leak initially).  Post-procedure echoes have shown elevated gradient across the aortic valve though dimensionless index has only been in the mildly stenotic range.  Last echo in 9/20 showed mean aortic valve gradient 26 mmHg with dimensionless index 0.54.   Patient has additionally been diagnosed recently with multiple myeloma.  He is getting treated with Revlimid, Velcade, and prednisone.   At last appointment, he was noted to be volume overloaded and thrombocytopenic.  Eliquis and ASA were held and Lasix was started.  Toprol XL was decreased due to bradycardia.   Patient returns for followup of aortic stenosis and atrial fibrillation.  He is in NSR with HR now up in the 90s, he has noted increased HR with cutting back Toprol XL.  He still has lower extremity edema though it seems to be a bit better.  BNP is lower today.  He has developed an erythematous maculopapular rash on his legs/arms/neck. He does not have significant exertional dyspnea but legs feel heavy since developing edema.  No chest pain. No orthopnea/PND.  He was not able to find compression stockings.    ECG (personally reviewed): NSR 95  with PACs, LAFB, nonspecific lateral TWIs  Labs (5/14): K 3.7, creatinine 0.9, BNP 2261=>109, TSH normal Labs (7/14): K 3.5, creatinine 1.0 Labs (12/14): K 3.8, creatinine 1.0, LDL particle number 1374, LDL 107, TSH normal Labs (6/16): TSH normal, K 3.8, creatinine 0.84, HCT 42.4, LDL 85, LFTs normal Labs (3/17): K 3.8, creatinine 0.87 Labs (5/17): K 4.5, creatinine 0.97, HCT 43.7 Labs (11/17): K 3.2, creatinine 0.81, LDL 71, HDL 24 Labs (2/18): K 3.8, creatinine 0.89 Labs (2/19): K 3.7, creatinine 0.83,  hgb 16 Labs (11/19): LDL 82 Labs (1/20): TSH elevated but free T4 normal, K 4, creatinine 0.85, hgb 13.5 Labs (2/20): TSH mildly elevated but free T4 normal, K 4, creatinine 0.85 Labs (5/20): ESR 48 Labs (9/20): hgb 14.1, plts 38 => 82, K 3.3, creatinine 0.98 => 1.06  Allergies (verified):  No Known Drug Allergies   Past Medical History:  1. Hypertension: ACEI cough.  2. Atrial fibrillation. The patient had new-onset atrial fibrillation in November 2009. He underwent ibutilide cardioversion successfully. He has had 1-2 episodes/year that are short-lived that likely are atrial fibrillation with RVR. He was admitted in 5/14 with atrial fibrillation/RVR and had TEE-guided DCCV.  Atrial fibrillation again in 5/17, converted back to NSR spontaneously.  CHADSVASC score 2.  - Atrial fibrillation 2/19 with TEE-guided DCCV.  3. Hypercholesterolemia.  4. Bicuspid aortic valve disorder: Echo (7/11): EF 55-60%, mild LV hypertrophy, mild aortic stenosis with mean gradient 19 mmHg and peak gradient 36 mmHg.  TEE (5/14): EF 55%, mild LVH, bicuspid aortic valve with mild AS (mean gradient 13 mmHg), ascending aorta 4.5 cm.  Echo (9/15) with EF 65-70%, moderate AS (mean gradient 23 mmHg), mild AI, ascending aorta 4.1 cm, mild MR.   - Echo (4/17) with EF 65-70%, moderate aortic stenosis with mean gradient 27 mmHg, PASP 31 mmHg, ascending aorta 4.4 cm.  - Echo (2/18) with EF 55-60%, bicuspid aortic valve with moderate aortic stenosis (underestimated gradient).  - TEE (2/19): EF 60-65%, moderate LVH, bicuspid aortic valve with moderate AS with mean gradient 28 mmHg and AVA 1.2 cm^2, 4.4 cm ascending aorta.  - Echo (2/20): EF 60-65%, mild LVH, bicuspid aortic valve with severe AS (mean gradient 48 mmHg, AVA 0.8 cm^2).  - TAVR 8/20 with valve in valve [place] 3 THVs (because of peri-valvular leak after initial valve placed).  - Echo (9/20): EF > 65%, mild LVH, mild RV dilation with normal RV systolic  function, mean aortic valve gradient 26 mmHg with dimensionless index 0.54, IVC normal.  5. Osteoarthritis.  6. Low back pain.  7. Chest pain: ETT-myoview (12/11) with 10:51 exercise, no chest pain, no significant ST changes, EF 69%, no evidence for ischemia or infarction.  - Coronary CTA (2/20): calcium score 41, nonobstructive CAD.  - LHC (6/20): No significant CAD.  8. Ascending aortic aneurysm: Associated with bicuspid aortic valve.  4.5 cm by TEE in 5/14.   MRA chest (6/14) with bicuspid aortic valve, 4.3 cm ascending aortic aneurysm.  MRA chest (10/15) with 4.2 cm ascending aorta (bicuspid aortic valve noted).  Echo (4/17) with ascending aorta diameter 4.4 cm.  - MRA chest (11/17) with 4.1 cm ascending aorta.  - Echo (2/19): 4.4 cm ascending aorta.  - Coronary CTA in 2/20 showed 4.3 cm ascending aorta.  9. Colonic polyps: GI bleeding in 11/19 s/p polypectomy.  10. Multiple myeloma.  11. Bradycardia 12. Thrombocytopenia: likely related to chemotherapy for multiple myeloma.    Current Outpatient Medications  Medication Sig Dispense Refill  .  acyclovir (ZOVIRAX) 400 MG tablet Take 1 tablet (400 mg total) by mouth 2 (two) times daily. 60 tablet 3  . amLODipine (NORVASC) 5 MG tablet Take 1 tablet (5 mg total) by mouth daily. 90 tablet 3  . apixaban (ELIQUIS) 5 MG TABS tablet Take 1 tablet (5 mg total) by mouth 2 (two) times daily. 60 tablet 6  . aspirin 81 MG chewable tablet Chew 1 tablet (81 mg total) by mouth daily.    [name] dexamethasone (DECADRON) 4 MG tablet Take 5 tablets (20 mg total) by mouth once a week. 20 tablet 6  . famotidine (PEPCID) 40 MG tablet Take 1 tablet (40 mg total) by mouth daily. 30 tablet 11  . furosemide (LASIX) 20 MG tablet Take 3 tablets (60 mg total) by mouth daily. 270 tablet 1  . HYDROcodone-acetaminophen (NORCO/VICODIN) 5-325 MG tablet Take 1 tablet by mouth every 6 (six) hours as needed for severe pain. 60 tablet 0  . lenalidomide (REVLIMID) 15 MG capsule Take  1 capsule (15 mg total) by mouth daily. Take for 14 days on, 7 days off, repeat every 21 days. [employer] # [id number], 05/18/2019 14 capsule 0  . loratadine (CLARITIN) 10 MG tablet Take 1 tablet (10 mg total) by mouth daily. 30 tablet 11  . LORazepam (ATIVAN) 0.5 MG tablet Take 1 tablet (0.5 mg total) by mouth every 6 (six) hours as needed (Nausea or vomiting). 30 tablet 0  . metoprolol succinate (TOPROL-XL) 25 MG 24 hr tablet Take 25 mg by mouth daily.    . ondansetron (ZOFRAN) 8 MG tablet Take 1 tablet (8 mg total) by mouth 2 (two) times daily as needed (Nausea or vomiting). 30 tablet 1  . predniSONE (DELTASONE) 5 MG tablet TAKE 3 TABLETS (15 MG TOTAL) BY MOUTH DAILY WITH BREAKFAST. 90 tablet 1  . prochlorperazine (COMPAZINE) 10 MG tablet Take 1 tablet (10 mg total) by mouth every 6 (six) hours as needed (Nausea or vomiting). 30 tablet 1  . spironolactone (ALDACTONE) 25 MG tablet Take 1 tablet (25 mg total) by mouth daily. 90 tablet 3  . Vitamin D, Cholecalciferol, 50 MCG (2000 UT) CAPS Take 2,000 Units by mouth daily.     No current facility-administered medications for this encounter.     Allergies:   Xarelto [rivaroxaban], Corticosteroids, Ramipril, and Benazepril   Social History:  The patient  reports that he has never smoked. He has never used smokeless tobacco. He reports current alcohol use. He reports that he does not use drugs.   Family History:  The patient's family history includes Colon cancer (age of onset: 70) in his mother; Hypertension in an other family member.   ROS:  Please see the history of present illness.   All other systems are personally reviewed and negative.   Exam:   BP 138/64   Pulse 95   Wt 88.4 kg (194 lb 12.8 oz)   SpO2 97%   BMI 27.17 kg/m  General: NAD Neck: JVP 8 cm, no thyromegaly or thyroid nodule.  Lungs: Clear to auscultation bilaterally with normal respiratory effort. CV: Nondisplaced PMI.  Heart regular S1/S2, no S3/S4, no murmur.  2+ edema 1/2 to  knees bilaterally.  No carotid bruit.  Normal pedal pulses.  Abdomen: Soft, nontender, no hepatosplenomegaly, no distention.  Skin: maculopapular red rash on arms/legs/neck.  Neurologic: Alert and oriented x 3.  Psych: Normal affect. Extremities: No clubbing or cyanosis.  HEENT: Normal.   Recent Labs: 10/03/2018: TSH 4.76 05/09/2019: Magnesium 1.5 06/06/2019: ALT  26 06/11/2019: B Natriuretic Peptide 508.3; BUN 20; Creatinine, Ser 1.17; Hemoglobin 13.0; Platelets 174; Potassium 4.1; Sodium 138  Personally reviewed   Wt Readings from Last 3 Encounters:  06/11/19 88.4 kg (194 lb 12.8 oz)  06/06/19 87.8 kg (193 lb 9.6 oz)  06/06/19 87.7 kg (193 lb 6.4 oz)      ASSESSMENT AND PLAN:  1. Thrombocytopenia: Resolving. Suspect this is related to his multiple myeloma treatment.  - Repeat CBC today.  If platelets continue to rise, he can restart ASA 81 and apixaban 5 mg bid.  2. Atrial fibrillation: Paroxysmal. He is quite symptomatic when in atrial fibrillation.  CHADSVASC = 3.  He is in NSR today.   - As above, restart Eliquis.  - He has not wanted atrial fibrillation ablation.   3. Bradycardia: After TAVR, patient had RBBB and LAFB.  RBBB has resolved, but at last appointment, HR was down in the 40s so I decreased Toprol XL.  Now, HR up in the 90s.  - He wore a 3 day monitor to look for significant conduction disease/clinically significant bradycardia, still being processed so no result yet.  - Continue Toprol XL 25 mg daily.   4. HTN: BP controlled.   - Continue amlodipine 5 mg daily.  I do not think this is causing his peripheral edema as edema was present before he started amlodipine.   5. Bicuspid aortic valve disorder: Ascending aorta was 4.3 cm on recent coronary CTA. He is now s/p TAVR with 2 [place] (valve-in-valve due to peri-valvular leak after 1st valve placed). Post-op, mean gradient across the aortic valve has been elevated, most recently 26 mmHg in 9/20.  Dimensionless  index, most recently 0.54, only suggests mild bioprosthetic aortic stenosis.  I suspect the elevated gradient is due to a combination of high flow and patient-prosthetic mismatch due to valve-in-valve placement.  - He has been on ASA 81 + Eliquis post-TAVR, these meds will be restarted with improved platelets.   6. Chronic diastolic CHF: He appears volume overloaded on exam still, prominent LE edema which makes it difficult to walk though somewhat improved from prior and BNP is lower.  - He needs to get graded compression stockings, I asked him to try a different store.  - Increase Lasix to 60 mg daily with BMET today and again in 10 days.   - Continue spironolactone 25 mg daily.   7. Multiple myeloma: He is on Revlimid, prednisone, and Velcade per Dr. Irene Limbo.  8. Rash: Maculopapular rash on arms/legs/neck is concerning for a drug eruption.  I do not think that Lasix is the culprit as he has had this in the past without rash. ?Related to myeloma therapy.   Followup in 2 wks with NP.   Signed, [name], MD  06/11/2019  [facility] [address] [facility] [place] [address] [phone] (office) [phone] (fax)

## 2019-06-11 NOTE — Patient Instructions (Addendum)
Labs done today. We will contact you only if your labs are abnormal.  INCREASE Lasix 60mg (3 tabs) by mouth once daily.  Please continue all other medication as prescribed.   Please make sure you get compression stockings. You can check [facility] supply [address] [place], [place] [address] [phone]   Your physician recommends that you schedule a follow-up appointment in: 2 weeks for a lab only appointment and a follow up appointment with Amy Clegg,NP in 2 weeks here in office.   At the [facility], you and your health needs are our priority. As part of our continuing mission to provide you with exceptional heart care, we have created designated Provider Care Teams. These Care Teams include your primary Cardiologist (physician) and Advanced Practice Providers (APPs- Physician Assistants and Nurse Practitioners) who all work together to provide you with the care you need, when you need it.   You may see any of the following providers on your designated Care Team at your next follow up: [name] Dr Glori Bickers . Dr [name] . Darrick Grinder, NP   Please be sure to bring in all your medications bottles to every appointment.

## 2019-06-11 NOTE — Progress Notes (Signed)
HEMATOLOGY/ONCOLOGY CLINIC NOTE  Date of Service: 06/12/2019  Patient Care Team: Cassandria Anger, MD as PCP - General Erline Levine, MD as Attending Physician (Neurosurgery) Larey Dresser, MD (Cardiology) Jarome Matin, MD as Consulting Physician (Dermatology) Rigoberto Noel, MD as Consulting Physician (Pulmonary Disease)   CHIEF COMPLAINTS/PURPOSE OF CONSULTATION:  Recently diagnosed Plasma cell myeloma   HISTORY OF PRESENTING ILLNESS:  [name] [name] is a wonderful 78 y.o. male who has been referred to [place] by Angelena Form, PA for evaluation and management of lytic bone lesions. The pt reports that he is doing well overall.  The pt reports that he has occasional hip pain that radiates down his legs and prevents him from walking. He uses Advil, which helps his hip pain. He is used to exercising often, but has not been able to stay very physically active lately so he is gaining weight. The pt experiences some SOB when he wakes up in the morning.  The pt had a pre-procedural CTA C/A/P before a TAVR completed on 03/14/2019 which revealed "indeterminate osseus lesions in the bony pelvis," which led to an MRI and PET scan. He reports that he has pain when he pushes on his right chest.   He also notes that he had a fall while exercising in 06/2018 and thought he broke his back. He received a blood transfusion on 07/24/2018.   Of note prior to the patient's visit today, the pt has had a MRI pelvis w/wo contrast completed on 03/28/2019 with results revealing "1. Destructive bone lesions as detailed above. Findings most consistent with metastatic disease. PET-CT may be helpful for further evaluation and to establish a primary tumor. The right pelvic bone lesions should be amenable to image guided biopsy but a PET scan may demonstrate easier/safer biopsy sites. 2. No intrapelvic mass or adenopathy. 3. Benign intraosseous lipoma involving the left anterior superior acetabulum."  The pt  has also had PET whole body completed on 04/06/2019 with results revealing "1. Diffuse osseous metastatic disease as detailed above without findings for a primary neoplasm in the chest, abdomen or pelvis. The large destructive lesion involving the right ischium should be amenable to image guided biopsy. 2. Two small retroperitoneal lymph nodes and 1 small right obturator node showing hypermetabolism."   Most recent lab results (04/06/2019) of CBC is as follows: all values are WNL.  On review of systems, pt reports hip and leg pain, weight gain and denies syncope and denies belly pain, recent neuropathy and any other symptoms.   On PMHx the pt reports 5 cm hepatic flexure polyp removal, pneumonia, blood transfusion on 07/24/2018  On Social Hx the pt reports that he lives at home with his wife. He is from [place].    INTERVAL HISTORY:  [name] [name] is a wonderful 78 y.o. male who has who is here today for evaluation and management of newly diagnosed Plasma Cell Myeloma. Pt is here for C2D1 of Velcade. The patient's last visit with [place] was on 05/25/2019. The pt reports that he is doing well overall.   The pt reports leg swelling and a rash. He took 40 mg of Lasix yesterday and will be taking 60 mg today. He has noticed his leg swelling decrease since beginning the Lasix. He does try to elevate his legs when he is at home. Pt has received his Revlimid and took it for three days but stopped due to the fact that he was supposed to begin taking the Revlimid on 06/12/2019.  His rash & leg swelling appeared almost 2 weeks after he stopped taking the Revlimid. Pt has restarted taking his Eliquis and Asprin. He denies numbness and tingling in his hands and feet and diarrhea. His previous hip pain is about the same. He also has pain in his neck, which he first noticed this morning. Pt is only using half of his recommended dose of his pain medicine and says that his pain is well controlled on that dosage. Pt is  worried that his Transcatheter Aortic Valve Replacement has caused new problems because he has noticed that his HR used to be in the 50s and is now regularly in the 90s.   Lab results today (06/12/19) of CBC w/diff and CMP is as follows: all values are WNL except for Hgb at 12.9, Monocytes Abs at 1.5K, Eosinophils Abs at 0.6K, Glucose at 126, Calcium at 8.6, Total Protein at 6.4, GFR Est Non Af Am at 55.  On review of systems, pt reports rash, leg swelling, new neck pain, hip pain and denies diarrhea, occasional numbness/tingling in hands/feet, leg pain and any other symptoms.    MEDICAL HISTORY:  Past Medical History:  Diagnosis Date   Acute blood loss anemia    Adenomatous colon polyp    Ascending aortic aneurysm (HCC)    Bicuspid aortic valve    Chest pain    ETT-myoview 12/11 w/exercise, no chest pain, no significant ST changes, EF 69%, no evidence for ischemia or infarction.   CHF NYHA class I (no symptoms from ordinary activities), acute, diastolic (HCC)    Fatty liver    mild   GI bleed    GI bleeding 07/21/2018   post polypectomy   Hemorrhoids    HTN (hypertension)    Hypercholesteremia    Hypokalemia    Internal hemorrhoids    LBP (low back pain)    Moderate aortic stenosis    Osteoarthritis    Paroxysmal atrial fibrillation ([place])    a. new onset Afib in 07/2008. He underwent ibutilide cardioversion successfully. b. Recurrence 01/2013 s/p TEE/DCCV - was on Xarelto but he stopped it as he was convinced it was causing joint pn. c. Recurrence 01/2016 - spont conv to NSR. Pt took Eliquis x1 mo then declined further anticoag. d. Recurrence 07/2016.   Pneumonia    Tubular adenoma of colon     SURGICAL HISTORY: Past Surgical History:  Procedure Laterality Date   BACK SURGERY  x12 years ago   CARDIOVERSION N/A 01/26/2013   Procedure: CARDIOVERSION;  Surgeon: Larey Dresser, MD;  Location: [place];  Service: Cardiovascular;  Laterality: N/A;    CARDIOVERSION N/A 10/28/2017   Procedure: CARDIOVERSION;  Surgeon: Larey Dresser, MD;  Location: [facility] ENDOSCOPY;  Service: Cardiovascular;  Laterality: N/A;   CARDIOVERSION N/A 03/03/2018   Procedure: CARDIOVERSION;  Surgeon: Lelon Perla, MD;  Location: [facility] ENDOSCOPY;  Service: Cardiovascular;  Laterality: N/A;   COLONOSCOPY     COLONOSCOPY  07/17/2018   at [place]     RIGHT/LEFT HEART CATH AND CORONARY ANGIOGRAPHY N/A 03/07/2019   Procedure: RIGHT/LEFT HEART CATH AND CORONARY ANGIOGRAPHY;  Surgeon: Burnell Blanks, MD;  Location: [place] CV LAB;  Service: Cardiovascular;  Laterality: N/A;   TEE WITHOUT CARDIOVERSION N/A 01/26/2013   Procedure: TRANSESOPHAGEAL ECHOCARDIOGRAM (TEE);  Surgeon: Larey Dresser, MD;  Location: [place];  Service: Cardiovascular;  Laterality: N/A;   TEE WITHOUT CARDIOVERSION  N/A 10/28/2017   Procedure: TRANSESOPHAGEAL ECHOCARDIOGRAM (TEE);  Surgeon: Larey Dresser, MD;  Location[facility] ENDOSCOPY;  Service: Cardiovascular;  Laterality: N/A;   TEE WITHOUT CARDIOVERSION N/A 05/08/2019   Procedure: TRANSESOPHAGEAL ECHOCARDIOGRAM (TEE);  Surgeon: Burnell Blanks, MD;  Location: [place] CV LAB;  Service: Open Heart Surgery;  Laterality: N/A;   TRANSCATHETER AORTIC VALVE REPLACEMENT, TRANSFEMORAL N/A 05/08/2019   Procedure: TRANSCATHETER AORTIC VALVE REPLACEMENT, TRANSFEMORAL;  Surgeon: Burnell Blanks, MD;  Location: [place] CV LAB;  Service: Open Heart Surgery;  Laterality: N/A;    SOCIAL HISTORY: Social History   Socioeconomic History   Marital status: Married    Spouse name: Not on file   Number of children: 0   Years of education: Not on file   Highest education level: Not on file  Occupational History   Occupation: Retired Lobbyist: [employer] resource strain: Not on file   Food insecurity     Worry: Not on file    Inability: Not on file   Transportation needs    Medical: Not on file    Non-medical: Not on file  Tobacco Use   Smoking status: Never Smoker   Smokeless tobacco: Never Used  Substance and Sexual Activity   Alcohol use: Yes    Comment: Drinks 1 glass of wine nightly/socially   Drug use: No   Sexual activity: Yes  Lifestyle   Physical activity    Days per week: Not on file    Minutes per session: Not on file   Stress: Not on file  Relationships   Social connections    Talks on phone: Not on file    Gets together: Not on file    Attends religious service: Not on file    Active member of club or organization: Not on file    Attends meetings of clubs or organizations: Not on file    Relationship status: Not on file   Intimate partner violence    Fear of current or ex partner: Not on file    Emotionally abused: Not on file    Physically abused: Not on file    Forced sexual activity: Not on file  Other Topics Concern   Not on file  Social History Narrative   Patient lives in [place] w/ his wife. He is a native of [place]. 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   Right-handed   Caffeine: 2 cups coffee per day    FAMILY HISTORY: Family History  Problem Relation Age of Onset   Colon cancer Mother 62   Hypertension Other    Coronary artery disease Neg Hx    Colon polyps Neg Hx    Esophageal cancer Neg Hx    Rectal cancer Neg Hx    Stomach cancer Neg Hx     ALLERGIES:  is allergic to xarelto [rivaroxaban]; corticosteroids; ramipril; and benazepril.  MEDICATIONS:  Current Outpatient Medications  Medication Sig Dispense Refill   acyclovir (ZOVIRAX) 400 MG tablet Take 1 tablet (400 mg total) by mouth 2 (two) times daily. 60 tablet 3   amLODipine (NORVASC) 5 MG tablet Take 1 tablet (5 mg total) by mouth daily. 90 tablet 3   apixaban (ELIQUIS) 5 MG TABS tablet Take 1 tablet (5 mg total) by  mouth 2 (two) times daily. 60 tablet 6   aspirin 81 MG chewable tablet Chew 1 tablet (81 mg  total) by mouth daily.     dexamethasone (DECADRON) 4 MG tablet Take 5 tablets (20 mg total) by mouth once a week. 20 tablet 6   famotidine (PEPCID) 40 MG tablet Take 1 tablet (40 mg total) by mouth daily. 30 tablet 11   furosemide (LASIX) 20 MG tablet Take 3 tablets (60 mg total) by mouth daily. 270 tablet 1   HYDROcodone-acetaminophen (NORCO/VICODIN) 5-325 MG tablet Take 1 tablet by mouth every 6 (six) hours as needed for severe pain. 60 tablet 0   lenalidomide (REVLIMID) 15 MG capsule Take 1 capsule (15 mg total) by mouth daily. Take for 14 days on, 7 days off, repeat every 21 days. [employer] # [id number], 05/18/2019 14 capsule 0   loratadine (CLARITIN) 10 MG tablet Take 1 tablet (10 mg total) by mouth daily. 30 tablet 11   LORazepam (ATIVAN) 0.5 MG tablet Take 1 tablet (0.5 mg total) by mouth every 6 (six) hours as needed (Nausea or vomiting). 30 tablet 0   metoprolol succinate (TOPROL-XL) 25 MG 24 hr tablet Take 25 mg by mouth daily.     ondansetron (ZOFRAN) 8 MG tablet Take 1 tablet (8 mg total) by mouth 2 (two) times daily as needed (Nausea or vomiting). 30 tablet 1   predniSONE (DELTASONE) 5 MG tablet TAKE 3 TABLETS (15 MG TOTAL) BY MOUTH DAILY WITH BREAKFAST. 90 tablet 1   prochlorperazine (COMPAZINE) 10 MG tablet Take 1 tablet (10 mg total) by mouth every 6 (six) hours as needed (Nausea or vomiting). 30 tablet 1   spironolactone (ALDACTONE) 25 MG tablet Take 1 tablet (25 mg total) by mouth daily. 90 tablet 3   Vitamin D, Cholecalciferol, 50 MCG (2000 UT) CAPS Take 2,000 Units by mouth daily.     No current facility-administered medications for this visit.     REVIEW OF SYSTEMS:    A 10+ POINT REVIEW OF SYSTEMS WAS OBTAINED including neurology, dermatology, psychiatry, cardiac, respiratory, lymph, extremities, GI, GU, Musculoskeletal, constitutional, breasts, reproductive, HEENT.  All  pertinent positives are noted in the HPI.  All others are negative.   PHYSICAL EXAMINATION: ECOG PERFORMANCE STATUS: 2 - Symptomatic, <50% confined to bed  . Vitals:   06/12/19 0855  BP: 124/68  Pulse: 97  Resp: 18  Temp: 98.9 F (37.2 C)  SpO2: 99%   Filed Weights   06/12/19 0855  Weight: 194 lb 1.6 oz (88 kg)   .Body mass index is 27.07 kg/m.    GENERAL:alert, in no acute distress and comfortable SKIN: no acute rashes, no significant lesions EYES: conjunctiva are pink and non-injected, sclera anicteric OROPHARYNX: MMM, no exudates, no oropharyngeal erythema or ulceration NECK: supple, no JVD LYMPH:  no palpable lymphadenopathy in the cervical, axillary or inguinal regions LUNGS: clear to auscultation b/l with normal respiratory effort HEART: regular rate & rhythm ABDOMEN:  normoactive bowel sounds , non tender, not distended. No palpable hepatosplenomegaly.  Extremity: no pedal edema PSYCH: alert & oriented x 3 with fluent speech NEURO: no focal motor/sensory deficits   LABORATORY DATA:  I have reviewed the data as listed  . CBC Latest Ref Rng & Units 06/12/2019 06/11/2019 06/08/2019  WBC 4.0 - 10.5 K/uL 7.1 7.0 9.7  Hemoglobin 13.0 - 17.0 g/dL 12.9(L) 13.0 13.8  Hematocrit 39.0 - 52.0 % 39.2 41.0 42.2  Platelets 150 - 400 K/uL 174 174 82(L)    . CMP Latest Ref Rng & Units 06/12/2019 06/11/2019 06/08/2019  Glucose 70 - 99 mg/dL 126(H) 120(H) 93  BUN 8 -  23 mg/dL _0 Creatinine 0.61 - 1.24 mg/dL 1.24 1.17 1.06  Sodium 135 - 145 mmol/L 141 138 137  Potassium 3.5 - 5.1 mmol/L 3.8 4.1 3.5  Chloride 98 - 111 mmol/L 105 106 102  CO2 22 - 32 mmol/L 25 19(L) 26  Calcium 8.9 - 10.3 mg/dL 8.6(L) 8.7(L) 8.7(L)  Total Protein 6.5 - 8.1 g/dL 6.4(L) - -  Total Bilirubin 0.3 - 1.2 mg/dL 0.8 - -  Alkaline Phos 38 - 126 U/L 86 - -  AST 15 - 41 U/L 25 - -  ALT 0 - 44 U/L 23 - -   Component     Latest Ref Rng & Units 04/30/2019  Total Protein, Urine-UPE24     Not  Estab. mg/dL   Total Protein, Urine-Ur/day     30 - 150 mg/24 hr   ALBUMIN, U     %   ALPHA 1 URINE     %   Alpha 2, Urine     %   % BETA, Urine     %   GAMMA GLOBULIN URINE     %   Free Kappa Lt Chains,Ur     0.63 - 113.79 mg/L   Free Lambda Lt Chains,Ur     0.47 - 11.77 mg/L   Free Kappa/Lambda Ratio     1.03 - 31.76   Immunofixation Result, Urine        Total Volume        M-SPIKE %, Urine     Not Observed %   M-Spike, mg/24 hr     Not Observed mg/24 hr   NOTE:        IgG (Immunoglobin G), Serum     603 - 1,613 mg/dL 1,740 (H)  IgA     61 - 437 mg/dL 16 (L)  IgM (Immunoglobulin M), Srm     15 - 143 mg/dL 9 (L)  Total Protein ELP     6.0 - 8.5 g/dL 7.3  Albumin SerPl Elph-Mcnc     2.9 - 4.4 g/dL 3.8  Alpha 1     0.0 - 0.4 g/dL 0.3  Alpha2 Glob SerPl Elph-Mcnc     0.4 - 1.0 g/dL 0.7  B-Globulin SerPl Elph-Mcnc     0.7 - 1.3 g/dL 0.9  Gamma Glob SerPl Elph-Mcnc     0.4 - 1.8 g/dL 1.6  M Protein SerPl Elph-Mcnc     Not Observed g/dL 1.5 (H)  Globulin, Total     2.2 - 3.9 g/dL 3.5  Albumin/Glob SerPl     0.7 - 1.7 1.1  IFE 1      Comment  Please Note (HCV):      Comment  Kappa free light chain     3.3 - 19.4 mg/L 821.2 (H)  Lamda free light chains     5.7 - 26.3 mg/L 1.9 (L)  Kappa, lamda light chain ratio     0.26 - 1.65 432.21 (H)  LDH     98 - 192 U/L 132  Sed Rate     0 - 16 mm/hr 3   05/24/2019 FISH Panel     05/24/2019 Bone Marrow Biopsy    04/16/2019 Surgical Pathology:   RADIOGRAPHIC STUDIES: I have personally reviewed the radiological images as listed and agreed with the findings in the report. Ct Biopsy  Result Date: 05/24/2019 INDICATION: 78 year old with multiple myeloma.  Request for bone marrow biopsy. EXAM: CT GUIDED BONE MARROW ASPIRATES AND BIOPSY Physician: Quita Skye  Acquanetta Belling, MD MEDICATIONS: None. ANESTHESIA/SEDATION: Fentanyl 50 mcg IV; Versed 2.0 mg IV Moderate Sedation Time:  10 minutes The patient was continuously  monitored during the procedure by the interventional radiology nurse under my direct supervision. COMPLICATIONS: None immediate. PROCEDURE: The procedure was explained to the patient. The risks and benefits of the procedure were discussed and the patient's questions were addressed. Informed consent was obtained from the patient. The patient was placed prone on CT table. Images of the pelvis were obtained. The right side of back was prepped and draped in sterile fashion. The skin and right posterior ilium were anesthetized with 1% lidocaine. 11 gauge bone needle was directed into the right ilium with CT guidance. Two aspirates and one core biopsy were obtained. Bandage placed over the puncture site. IMPRESSION: CT guided bone marrow aspiration and core biopsy. Electronically Signed   By: Markus Daft M.D.   On: 05/24/2019 11:19   Ct Bone Marrow Biopsy & Aspiration  Result Date: 05/24/2019 INDICATION: 78 year old with multiple myeloma.  Request for bone marrow biopsy. EXAM: CT GUIDED BONE MARROW ASPIRATES AND BIOPSY Physician: Stephan Minister. Anselm Pancoast, MD MEDICATIONS: None. ANESTHESIA/SEDATION: Fentanyl 50 mcg IV; Versed 2.0 mg IV Moderate Sedation Time:  10 minutes The patient was continuously monitored during the procedure by the interventional radiology nurse under my direct supervision. COMPLICATIONS: None immediate. PROCEDURE: The procedure was explained to the patient. The risks and benefits of the procedure were discussed and the patient's questions were addressed. Informed consent was obtained from the patient. The patient was placed prone on CT table. Images of the pelvis were obtained. The right side of back was prepped and draped in sterile fashion. The skin and right posterior ilium were anesthetized with 1% lidocaine. 11 gauge bone needle was directed into the right ilium with CT guidance. Two aspirates and one core biopsy were obtained. Bandage placed over the puncture site. IMPRESSION: CT guided bone marrow  aspiration and core biopsy. Electronically Signed   By: Markus Daft M.D.   On: 05/24/2019 11:19    ASSESSMENT & PLAN:   78 yo   #1 Recently diagnosed Plasma cell myeloma  03/28/2019 MRI pelvis w/wo contrast revealed "1. Destructive bone lesions as detailed above. Findings most consistent with metastatic disease. PET-CT may be helpful for further evaluation and to establish a primary tumor. The right pelvic bone lesions should be amenable to image guided biopsy but a PET scan may demonstrate easier/safer biopsy sites. 2. No intrapelvic mass or adenopathy. 3. Benign intraosseous lipoma involving the left anterior superior acetabulum."  04/06/2019 PET whole body revealed "1. Diffuse osseous metastatic disease as detailed above without findings for a primary neoplasm in the chest, abdomen or pelvis. The large destructive lesion involving the right ischium should be amenable to image guided biopsy. 2. Two small retroperitoneal lymph nodes and 1 small right obturator node showing hypermetabolism."   04/16/2019 Posterior right pelvis bone biopsy revealed PLASMA CELL NEOPLASM  05/24/2019 Bone Marrow Biopsy revealed "BONE MARROW: - CELLULAR MARROW WITH INVOLVEMENT BY PLASMA CELL NEOPLASM (20%) PERIPHERAL BLOOD: - MORPHOLOGICALLY UNREMARKABLE"  05/24/2019 FISH Panel revealed "ABNORMAL result with 11q+, 14q+ and +17"  #2 Severe aortic stenosis with bicuspid aortic valve -10/26/2018 ECHO revealed AVA at 0.8 cm2 and LV EF of 60-65% -05/08/2019 pt had a Transfemoral Transcatheter Aortic Valve Replacement   PLAN: -Discussed pt labwork today, 06/12/19; all values are WNL except for Hgb at 12.9, Monocytes Abs at 1.5K, Eosinophils Abs at 0.6K, Glucose at 126, Calcium at 8.6,  Total Protein at 6.4, GFR Est Non Af Am at 55.  -Recommended walking against doing any high impact or high strength exercise to avoid injury -Recommended pt to use compression socks and elevate legs to help with his leg swelling    -Will reduce sterioids (dexamethasone was already reduced at 8m--now further reduced to17mweekly) -Continue Lasix  -Will hold off on Revlimid for C2  -Advised pt to begin taking Revlimid with C3 -Pt has no prohibitive toxicities from continuing C2D1 Velcade at this time -Will see back in 10 days with labs   FOLLOW UP: Please schedule remain C2 as per treatment orders Please schedule C3 of treatment as per treatment orders. Labs in D1 and D8 of each cycle MD visit on C2D11 and C3D1   The total time spent in the appt was 25 minutes and more than 50% was on counseling and direct patient cares.  All of the patient's questions were answered with apparent satisfaction. The patient knows to call the clinic with any problems, questions or concerns.    GaSullivan LoneD MSHatilloAHIVMS SCRadiance A Private Outpatient Surgery Center LLCTMemorial Hospital Of Carbon Countyematology/Oncology Physician CoNorth Kitsap [facility] Inc(Office):       [phone] cell):  [phone]):           [phone]/29/2020 9:07 AM  I, JaYevette Edwardsam acting as a scribe for Dr. GaSullivan Lone  .I have reviewed the above documentation for accuracy and completeness, and I agree with the above. .GBrunetta GeneraD

## 2019-06-12 ENCOUNTER — Inpatient Hospital Stay (HOSPITAL_BASED_OUTPATIENT_CLINIC_OR_DEPARTMENT_OTHER): Payer: Medicare Other | Admitting: Hematology

## 2019-06-12 ENCOUNTER — Inpatient Hospital Stay: Payer: Medicare Other

## 2019-06-12 ENCOUNTER — Other Ambulatory Visit: Payer: Self-pay

## 2019-06-12 VITALS — BP 124/68 | HR 97 | Temp 98.9°F | Resp 18 | Ht 71.0 in | Wt 194.1 lb

## 2019-06-12 DIAGNOSIS — C9000 Multiple myeloma not having achieved remission: Secondary | ICD-10-CM

## 2019-06-12 DIAGNOSIS — R5383 Other fatigue: Secondary | ICD-10-CM | POA: Diagnosis not present

## 2019-06-12 DIAGNOSIS — I480 Paroxysmal atrial fibrillation: Secondary | ICD-10-CM | POA: Diagnosis not present

## 2019-06-12 DIAGNOSIS — M25559 Pain in unspecified hip: Secondary | ICD-10-CM | POA: Diagnosis not present

## 2019-06-12 DIAGNOSIS — Z7189 Other specified counseling: Secondary | ICD-10-CM

## 2019-06-12 DIAGNOSIS — R0602 Shortness of breath: Secondary | ICD-10-CM | POA: Diagnosis not present

## 2019-06-12 DIAGNOSIS — C7951 Secondary malignant neoplasm of bone: Secondary | ICD-10-CM

## 2019-06-12 DIAGNOSIS — Z5111 Encounter for antineoplastic chemotherapy: Secondary | ICD-10-CM | POA: Diagnosis not present

## 2019-06-12 LAB — CBC WITH DIFFERENTIAL (CANCER CENTER ONLY)
Abs Immature Granulocytes: 0.05 10*3/uL (ref 0.00–0.07)
Basophils Absolute: 0 10*3/uL (ref 0.0–0.1)
Basophils Relative: 0 %
Eosinophils Absolute: 0.6 10*3/uL — ABNORMAL HIGH (ref 0.0–0.5)
Eosinophils Relative: 8 %
HCT: 39.2 % (ref 39.0–52.0)
Hemoglobin: 12.9 g/dL — ABNORMAL LOW (ref 13.0–17.0)
Immature Granulocytes: 1 %
Lymphocytes Relative: 11 %
Lymphs Abs: 0.8 10*3/uL (ref 0.7–4.0)
MCH: 30.2 pg (ref 26.0–34.0)
MCHC: 32.9 g/dL (ref 30.0–36.0)
MCV: 91.8 fL (ref 80.0–100.0)
Monocytes Absolute: 1.5 10*3/uL — ABNORMAL HIGH (ref 0.1–1.0)
Monocytes Relative: 21 %
Neutro Abs: 4.1 10*3/uL (ref 1.7–7.7)
Neutrophils Relative %: 59 %
Platelet Count: 174 10*3/uL (ref 150–400)
RBC: 4.27 MIL/uL (ref 4.22–5.81)
RDW: 13.3 % (ref 11.5–15.5)
WBC Count: 7.1 10*3/uL (ref 4.0–10.5)
nRBC: 0 % (ref 0.0–0.2)

## 2019-06-12 LAB — CMP (CANCER CENTER ONLY)
ALT: 23 U/L (ref 0–44)
AST: 25 U/L (ref 15–41)
Albumin: 3.7 g/dL (ref 3.5–5.0)
Alkaline Phosphatase: 86 U/L (ref 38–126)
Anion gap: 11 (ref 5–15)
BUN: 21 mg/dL (ref 8–23)
CO2: 25 mmol/L (ref 22–32)
Calcium: 8.6 mg/dL — ABNORMAL LOW (ref 8.9–10.3)
Chloride: 105 mmol/L (ref 98–111)
Creatinine: 1.24 mg/dL (ref 0.61–1.24)
GFR, Est AFR Am: >60 mL/min (ref >60)
GFR, Estimated: 55 mL/min — ABNORMAL LOW (ref >60)
Glucose, Bld: 126 mg/dL — ABNORMAL HIGH (ref 70–99)
Potassium: 3.8 mmol/L (ref 3.5–5.1)
Sodium: 141 mmol/L (ref 135–145)
Total Bilirubin: 0.8 mg/dL (ref 0.3–1.2)
Total Protein: 6.4 g/dL — ABNORMAL LOW (ref 6.5–8.1)

## 2019-06-12 MED ORDER — PROCHLORPERAZINE MALEATE 10 MG PO TABS
ORAL_TABLET | ORAL | Status: AC
  Filled 2019-06-12: qty 1

## 2019-06-12 MED ORDER — DEXAMETHASONE 4 MG PO TABS: 20.0000 mg | ORAL_TABLET | Freq: Once | ORAL | Status: DC

## 2019-06-12 MED ORDER — DEXAMETHASONE 4 MG PO TABS
ORAL_TABLET | ORAL | Status: AC
  Filled 2019-06-12: qty 3

## 2019-06-12 MED ORDER — PROCHLORPERAZINE MALEATE 10 MG PO TABS
10.0000 mg | ORAL_TABLET | Freq: Once | ORAL | Status: AC
  Administered 2019-06-12: 10 mg via ORAL

## 2019-06-12 MED ORDER — DEXAMETHASONE 4 MG PO TABS
10.0000 mg | ORAL_TABLET | Freq: Once | ORAL | Status: AC
  Administered 2019-06-12: 10 mg via ORAL

## 2019-06-12 MED ORDER — BORTEZOMIB CHEMO SQ INJECTION 3.5 MG (2.5MG/ML)
1.3000 mg/m2 | Freq: Once | INTRAMUSCULAR | Status: AC
  Administered 2019-06-12: 2.75 mg via SUBCUTANEOUS
  Filled 2019-06-12: qty 1.1

## 2019-06-12 MED ORDER — DEXAMETHASONE 4 MG PO TABS: 12.0000 mg | ORAL_TABLET | ORAL | 6 refills | Status: DC

## 2019-06-12 NOTE — Progress Notes (Signed)
Patient did not take dexamethasone at home today - MD would like him to get 10mg  in clinic today. Will follow up on future dexamethasone plan w/ MD regarding dose and setting of administration.   Demetrius Charity, PharmD, [facility] Pharmacist Pharmacy Phone: [phone] 06/12/2019

## 2019-06-13 ENCOUNTER — Telehealth: Payer: Self-pay | Admitting: Hematology

## 2019-06-13 NOTE — Telephone Encounter (Signed)
Per 9/29 los appts were already scheduled just added additional treatments to his appt calendar.  Spoke with patient and he is aware of the appt dates and time,

## 2019-06-14 DIAGNOSIS — Z23 Encounter for immunization: Secondary | ICD-10-CM | POA: Diagnosis not present

## 2019-06-15 ENCOUNTER — Inpatient Hospital Stay: Payer: Medicare Other | Attending: Hematology

## 2019-06-15 ENCOUNTER — Other Ambulatory Visit: Payer: Self-pay

## 2019-06-15 VITALS — BP 137/76 | HR 90 | Temp 98.5°F | Resp 18

## 2019-06-15 DIAGNOSIS — Z7952 Long term (current) use of systemic steroids: Secondary | ICD-10-CM | POA: Diagnosis not present

## 2019-06-15 DIAGNOSIS — I5032 Chronic diastolic (congestive) heart failure: Secondary | ICD-10-CM | POA: Diagnosis not present

## 2019-06-15 DIAGNOSIS — Z79899 Other long term (current) drug therapy: Secondary | ICD-10-CM | POA: Diagnosis not present

## 2019-06-15 DIAGNOSIS — Z9181 History of falling: Secondary | ICD-10-CM | POA: Insufficient documentation

## 2019-06-15 DIAGNOSIS — C7951 Secondary malignant neoplasm of bone: Secondary | ICD-10-CM | POA: Insufficient documentation

## 2019-06-15 DIAGNOSIS — Z7901 Long term (current) use of anticoagulants: Secondary | ICD-10-CM | POA: Diagnosis not present

## 2019-06-15 DIAGNOSIS — E7800 Pure hypercholesterolemia, unspecified: Secondary | ICD-10-CM | POA: Diagnosis not present

## 2019-06-15 DIAGNOSIS — M25559 Pain in unspecified hip: Secondary | ICD-10-CM | POA: Diagnosis not present

## 2019-06-15 DIAGNOSIS — Z7982 Long term (current) use of aspirin: Secondary | ICD-10-CM | POA: Insufficient documentation

## 2019-06-15 DIAGNOSIS — R630 Anorexia: Secondary | ICD-10-CM | POA: Insufficient documentation

## 2019-06-15 DIAGNOSIS — C9000 Multiple myeloma not having achieved remission: Secondary | ICD-10-CM | POA: Insufficient documentation

## 2019-06-15 DIAGNOSIS — K760 Fatty (change of) liver, not elsewhere classified: Secondary | ICD-10-CM | POA: Insufficient documentation

## 2019-06-15 DIAGNOSIS — I110 Hypertensive heart disease with heart failure: Secondary | ICD-10-CM | POA: Diagnosis not present

## 2019-06-15 DIAGNOSIS — I350 Nonrheumatic aortic (valve) stenosis: Secondary | ICD-10-CM | POA: Insufficient documentation

## 2019-06-15 DIAGNOSIS — Z5111 Encounter for antineoplastic chemotherapy: Secondary | ICD-10-CM | POA: Diagnosis not present

## 2019-06-15 DIAGNOSIS — Z86010 Personal history of colonic polyps: Secondary | ICD-10-CM | POA: Insufficient documentation

## 2019-06-15 DIAGNOSIS — R202 Paresthesia of skin: Secondary | ICD-10-CM | POA: Diagnosis not present

## 2019-06-15 DIAGNOSIS — R0609 Other forms of dyspnea: Secondary | ICD-10-CM | POA: Insufficient documentation

## 2019-06-15 DIAGNOSIS — K5900 Constipation, unspecified: Secondary | ICD-10-CM | POA: Diagnosis not present

## 2019-06-15 DIAGNOSIS — I480 Paroxysmal atrial fibrillation: Secondary | ICD-10-CM | POA: Diagnosis not present

## 2019-06-15 DIAGNOSIS — M1990 Unspecified osteoarthritis, unspecified site: Secondary | ICD-10-CM | POA: Insufficient documentation

## 2019-06-15 DIAGNOSIS — Z7189 Other specified counseling: Secondary | ICD-10-CM

## 2019-06-15 MED ORDER — PROCHLORPERAZINE MALEATE 10 MG PO TABS
10.0000 mg | ORAL_TABLET | Freq: Once | ORAL | Status: AC
  Administered 2019-06-15: 10 mg via ORAL

## 2019-06-15 MED ORDER — BORTEZOMIB CHEMO SQ INJECTION 3.5 MG (2.5MG/ML)
1.3000 mg/m2 | Freq: Once | INTRAMUSCULAR | Status: AC
  Administered 2019-06-15: 2.75 mg via SUBCUTANEOUS
  Filled 2019-06-15: qty 1.1

## 2019-06-15 MED ORDER — PROCHLORPERAZINE MALEATE 10 MG PO TABS
ORAL_TABLET | ORAL | Status: AC
  Filled 2019-06-15: qty 1

## 2019-06-15 NOTE — Patient Instructions (Signed)
[facility] Discharge Instructions for Patients Receiving Chemotherapy  Today you received the following chemotherapy agents: Bortezomib (VELCADE).  To help prevent nausea and vomiting after your treatment, we encourage you to take your nausea medication as prescribed.   If you develop nausea and vomiting that is not controlled by your nausea medication, call the clinic.   BELOW ARE SYMPTOMS THAT SHOULD BE REPORTED IMMEDIATELY:  *FEVER GREATER THAN 100.5 F  *CHILLS WITH OR WITHOUT FEVER  NAUSEA AND VOMITING THAT IS NOT CONTROLLED WITH YOUR NAUSEA MEDICATION  *UNUSUAL SHORTNESS OF BREATH  *UNUSUAL BRUISING OR BLEEDING  TENDERNESS IN MOUTH AND THROAT WITH OR WITHOUT PRESENCE OF ULCERS  *URINARY PROBLEMS  *BOWEL PROBLEMS  UNUSUAL RASH Items with * indicate a potential emergency and should be followed up as soon as possible.  Feel free to call the clinic should you have any questions or concerns. The clinic phone number is [phone].  Please show the CHEMO ALERT CARD at check-in to the Emergency Department and triage nurse.   Coronavirus (COVID-19) Are you at risk?  Are you at risk for the Coronavirus (COVID-19)?  To be considered HIGH RISK for Coronavirus (COVID-19), you have to meet the following criteria:  . Traveled to [place], [place], [place], [place] or [place]; or in the [place] to [place], [place], [place], or [place]; and have fever, cough, and shortness of breath within the last 2 weeks of travel OR . Been in close contact with a person diagnosed with COVID-19 within the last 2 weeks and have fever, cough, and shortness of breath . IF YOU DO NOT MEET THESE CRITERIA, YOU ARE CONSIDERED LOW RISK FOR COVID-19.  What to do if you are HIGH RISK for COVID-19?  . If you are having a medical emergency, call 911. . Seek medical care right away. Before you go to a doctor's office, urgent care or emergency department, call ahead and tell them  about your recent travel, contact with someone diagnosed with COVID-19, and your symptoms. You should receive instructions from your physician's office regarding next steps of care.  . When you arrive at healthcare provider, tell the healthcare staff immediately you have returned from visiting [place], [place], [place], [place] or [place]; or traveled in the [place] to [place], [place], [place], or [place]; in the last two weeks or you have been in close contact with a person diagnosed with COVID-19 in the last 2 weeks.   . Tell the health care staff about your symptoms: fever, cough and shortness of breath. . After you have been seen by a medical provider, you will be either: o Tested for (COVID-19) and discharged home on quarantine except to seek medical care if symptoms worsen, and asked to  - Stay home and avoid contact with others until you get your results (4-5 days)  - Avoid travel on public transportation if possible (such as bus, train, or airplane) or o Sent to the Emergency Department by EMS for evaluation, COVID-19 testing, and possible admission depending on your condition and test results.  What to do if you are LOW RISK for COVID-19?  Reduce your risk of any infection by using the same precautions used for avoiding the common cold or flu:  . Wash your hands often with soap and warm water for at least 20 seconds.  If soap and water are not readily available, use an alcohol-based hand sanitizer with at least 60% alcohol.  . If coughing   or sneezing, cover your mouth and nose by coughing or sneezing into the elbow areas of your shirt or coat, into a tissue or into your sleeve (not your hands). . Avoid shaking hands with others and consider head nods or verbal greetings only. . Avoid touching your eyes, nose, or mouth with unwashed hands.  . Avoid close contact with people who are sick. . Avoid places or events with large numbers of people in one location, like concerts or  sporting events. . Carefully consider travel plans you have or are making. . If you are planning any travel outside or inside the [place], visit the CDC's Travelers' Health webpage for the latest health notices. . If you have some symptoms but not all symptoms, continue to monitor at home and seek medical attention if your symptoms worsen. . If you are having a medical emergency, call 911.   ADDITIONAL HEALTHCARE OPTIONS FOR PATIENTS  [facility] / e-Visit: [email or web address]         [facility]: [phone]  [facility]: [phone]                   [facility]: [phone]   

## 2019-06-18 DIAGNOSIS — R001 Bradycardia, unspecified: Secondary | ICD-10-CM | POA: Diagnosis not present

## 2019-06-19 ENCOUNTER — Inpatient Hospital Stay: Payer: Medicare Other

## 2019-06-19 ENCOUNTER — Other Ambulatory Visit: Payer: Self-pay | Admitting: *Deleted

## 2019-06-19 ENCOUNTER — Other Ambulatory Visit: Payer: Self-pay

## 2019-06-19 VITALS — BP 143/81 | HR 79 | Temp 98.0°F | Resp 18

## 2019-06-19 DIAGNOSIS — R202 Paresthesia of skin: Secondary | ICD-10-CM | POA: Diagnosis not present

## 2019-06-19 DIAGNOSIS — C9000 Multiple myeloma not having achieved remission: Secondary | ICD-10-CM

## 2019-06-19 DIAGNOSIS — Z5111 Encounter for antineoplastic chemotherapy: Secondary | ICD-10-CM | POA: Diagnosis not present

## 2019-06-19 DIAGNOSIS — C7951 Secondary malignant neoplasm of bone: Secondary | ICD-10-CM | POA: Diagnosis not present

## 2019-06-19 DIAGNOSIS — R0609 Other forms of dyspnea: Secondary | ICD-10-CM | POA: Diagnosis not present

## 2019-06-19 DIAGNOSIS — Z7189 Other specified counseling: Secondary | ICD-10-CM

## 2019-06-19 DIAGNOSIS — M25559 Pain in unspecified hip: Secondary | ICD-10-CM | POA: Diagnosis not present

## 2019-06-19 LAB — CBC WITH DIFFERENTIAL/PLATELET
Abs Immature Granulocytes: 0.11 10*3/uL — ABNORMAL HIGH (ref 0.00–0.07)
Basophils Absolute: 0 10*3/uL (ref 0.0–0.1)
Basophils Relative: 0 %
Eosinophils Absolute: 0 10*3/uL (ref 0.0–0.5)
Eosinophils Relative: 0 %
HCT: 40.7 % (ref 39.0–52.0)
Hemoglobin: 13.4 g/dL (ref 13.0–17.0)
Immature Granulocytes: 2 %
Lymphocytes Relative: 12 %
Lymphs Abs: 0.7 10*3/uL (ref 0.7–4.0)
MCH: 30 pg (ref 26.0–34.0)
MCHC: 32.9 g/dL (ref 30.0–36.0)
MCV: 91.1 fL (ref 80.0–100.0)
Monocytes Absolute: 0.2 10*3/uL (ref 0.1–1.0)
Monocytes Relative: 4 %
Neutro Abs: 4.5 10*3/uL (ref 1.7–7.7)
Neutrophils Relative %: 82 %
Platelets: 103 10*3/uL — ABNORMAL LOW (ref 150–400)
RBC: 4.47 MIL/uL (ref 4.22–5.81)
RDW: 13 % (ref 11.5–15.5)
WBC: 5.4 10*3/uL (ref 4.0–10.5)
nRBC: 0.4 % — ABNORMAL HIGH (ref 0.0–0.2)

## 2019-06-19 LAB — CMP (CANCER CENTER ONLY)
ALT: 23 U/L (ref 0–44)
AST: 22 U/L (ref 15–41)
Albumin: 3.9 g/dL (ref 3.5–5.0)
Alkaline Phosphatase: 85 U/L (ref 38–126)
Anion gap: 10 (ref 5–15)
BUN: 32 mg/dL — ABNORMAL HIGH (ref 8–23)
CO2: 24 mmol/L (ref 22–32)
Calcium: 8.7 mg/dL — ABNORMAL LOW (ref 8.9–10.3)
Chloride: 104 mmol/L (ref 98–111)
Creatinine: 1.2 mg/dL (ref 0.61–1.24)
GFR, Est AFR Am: >60 mL/min (ref >60)
GFR, Estimated: 57 mL/min — ABNORMAL LOW (ref >60)
Glucose, Bld: 146 mg/dL — ABNORMAL HIGH (ref 70–99)
Potassium: 4.1 mmol/L (ref 3.5–5.1)
Sodium: 138 mmol/L (ref 135–145)
Total Bilirubin: 0.5 mg/dL (ref 0.3–1.2)
Total Protein: 6.8 g/dL (ref 6.5–8.1)

## 2019-06-19 MED ORDER — BORTEZOMIB CHEMO SQ INJECTION 3.5 MG (2.5MG/ML)
1.3000 mg/m2 | Freq: Once | INTRAMUSCULAR | Status: AC
  Administered 2019-06-19: 10:00:00 2.75 mg via SUBCUTANEOUS
  Filled 2019-06-19: qty 1.1

## 2019-06-19 MED ORDER — PROCHLORPERAZINE MALEATE 10 MG PO TABS
10.0000 mg | ORAL_TABLET | Freq: Once | ORAL | Status: AC
  Administered 2019-06-19: 10 mg via ORAL

## 2019-06-19 MED ORDER — SENNOSIDES-DOCUSATE SODIUM 8.6-50 MG PO TABS: 2.0000 | ORAL_TABLET | Freq: Every day | ORAL | 2 refills | Status: DC

## 2019-06-19 MED ORDER — PROCHLORPERAZINE MALEATE 10 MG PO TABS
ORAL_TABLET | ORAL | Status: AC
  Filled 2019-06-19: qty 1

## 2019-06-19 NOTE — Patient Instructions (Signed)
Bortezomib injection What is this medicine? BORTEZOMIB (bor TEZ oh mib) is a medicine that targets proteins in cancer cells and stops the cancer cells from growing. It is used to treat multiple myeloma and mantle-cell lymphoma. This medicine may be used for other purposes; ask your health care provider or pharmacist if you have questions. COMMON BRAND NAME(S): Velcade What should I tell my health care provider before I take this medicine? They need to know if you have any of these conditions:  diabetes  heart disease  irregular heartbeat  liver disease  on hemodialysis  low blood counts, like low white blood cells, platelets, or hemoglobin  peripheral neuropathy  taking medicine for blood pressure  an unusual or allergic reaction to bortezomib, mannitol, boron, other medicines, foods, dyes, or preservatives  pregnant or trying to get pregnant  breast-feeding How should I use this medicine? This medicine is for injection into a vein or for injection under the skin. It is given by a health care professional in a hospital or clinic setting. Talk to your pediatrician regarding the use of this medicine in children. Special care may be needed. Overdosage: If you think you have taken too much of this medicine contact a poison control center or emergency room at once. NOTE: This medicine is only for you. Do not share this medicine with others. What if I miss a dose? It is important not to miss your dose. Call your doctor or health care professional if you are unable to keep an appointment. What may interact with this medicine? This medicine may interact with the following medications:  ketoconazole  rifampin  ritonavir  St. John's Wort This list may not describe all possible interactions. Give your health care provider a list of all the medicines, herbs, non-prescription drugs, or dietary supplements you use. Also tell them if you smoke, drink alcohol, or use illegal drugs. Some  items may interact with your medicine. What should I watch for while using this medicine? You may get drowsy or dizzy. Do not drive, use machinery, or do anything that needs mental alertness until you know how this medicine affects you. Do not stand or sit up quickly, especially if you are an older patient. This reduces the risk of dizzy or fainting spells. In some cases, you may be given additional medicines to help with side effects. Follow all directions for their use. Call your doctor or health care professional for advice if you get a fever, chills or sore throat, or other symptoms of a cold or flu. Do not treat yourself. This drug decreases your body's ability to fight infections. Try to avoid being around people who are sick. This medicine may increase your risk to bruise or bleed. Call your doctor or health care professional if you notice any unusual bleeding. You may need blood work done while you are taking this medicine. In some patients, this medicine may cause a serious brain infection that may cause death. If you have any problems seeing, thinking, speaking, walking, or standing, tell your doctor right away. If you cannot reach your doctor, urgently seek other source of medical care. Check with your doctor or health care professional if you get an attack of severe diarrhea, nausea and vomiting, or if you sweat a lot. The loss of too much body fluid can make it dangerous for you to take this medicine. Do not become pregnant while taking this medicine or for at least 7 months after stopping it. Women should inform their doctor   if they wish to become pregnant or think they might be pregnant. Men should not father a child while taking this medicine and for at least 4 months after stopping it. There is a potential for serious side effects to an unborn child. Talk to your health care professional or pharmacist for more information. Do not breast-feed an infant while taking this medicine or for 2  months after stopping it. This medicine may interfere with the ability to have a child. You should talk with your doctor or health care professional if you are concerned about your fertility. What side effects may I notice from receiving this medicine? Side effects that you should report to your doctor or health care professional as soon as possible:  allergic reactions like skin rash, itching or hives, swelling of the face, lips, or tongue  breathing problems  changes in hearing  changes in vision  fast, irregular heartbeat  feeling faint or lightheaded, falls  pain, tingling, numbness in the hands or feet  right upper belly pain  seizures  swelling of the ankles, feet, hands  unusual bleeding or bruising  unusually weak or tired  vomiting  yellowing of the eyes or skin Side effects that usually do not require medical attention (report to your doctor or health care professional if they continue or are bothersome):  changes in emotions or moods  constipation  diarrhea  loss of appetite  headache  irritation at site where injected  nausea This list may not describe all possible side effects. Call your doctor for medical advice about side effects. You may report side effects to FDA at 1-800-FDA-1088. Where should I keep my medicine? This drug is given in a hospital or clinic and will not be stored at home. NOTE: This sheet is a summary. It may not cover all possible information. If you have questions about this medicine, talk to your doctor, pharmacist, or health care provider.  2020 [employer] Standard (2018-01-09 16:29:31)  You may take Colace or Sena-S in addition to your miralax for constipation.  These medications can be purchased over the counter.

## 2019-06-21 NOTE — Progress Notes (Signed)
HEMATOLOGY/ONCOLOGY CLINIC NOTE  Date of Service: 06/21/2019  Patient Care Team: Cassandria Anger, MD as PCP - General Erline Levine, MD as Attending Physician (Neurosurgery) Larey Dresser, MD (Cardiology) Jarome Matin, MD as Consulting Physician (Dermatology) Rigoberto Noel, MD as Consulting Physician (Pulmonary Disease)   CHIEF COMPLAINTS/PURPOSE OF CONSULTATION:  Recently diagnosed Plasma cell myeloma   HISTORY OF PRESENTING ILLNESS:  [name] [name] is a wonderful 78 y.o. male who has been referred to [place] by Angelena Form, PA for evaluation and management of lytic bone lesions. The pt reports that he is doing well overall.  The pt reports that he has occasional hip pain that radiates down his legs and prevents him from walking. He uses Advil, which helps his hip pain. He is used to exercising often, but has not been able to stay very physically active lately so he is gaining weight. The pt experiences some SOB when he wakes up in the morning.  The pt had a pre-procedural CTA C/A/P before a TAVR completed on 03/14/2019 which revealed "indeterminate osseus lesions in the bony pelvis," which led to an MRI and PET scan. He reports that he has pain when he pushes on his right chest.   He also notes that he had a fall while exercising in 06/2018 and thought he broke his back. He received a blood transfusion on 07/24/2018.   Of note prior to the patient's visit today, the pt has had a MRI pelvis w/wo contrast completed on 03/28/2019 with results revealing "1. Destructive bone lesions as detailed above. Findings most consistent with metastatic disease. PET-CT may be helpful for further evaluation and to establish a primary tumor. The right pelvic bone lesions should be amenable to image guided biopsy but a PET scan may demonstrate easier/safer biopsy sites. 2. No intrapelvic mass or adenopathy. 3. Benign intraosseous lipoma involving the left anterior superior acetabulum."  The pt  has also had PET whole body completed on 04/06/2019 with results revealing "1. Diffuse osseous metastatic disease as detailed above without findings for a primary neoplasm in the chest, abdomen or pelvis. The large destructive lesion involving the right ischium should be amenable to image guided biopsy. 2. Two small retroperitoneal lymph nodes and 1 small right obturator node showing hypermetabolism."   Most recent lab results (04/06/2019) of CBC is as follows: all values are WNL.  On review of systems, pt reports hip and leg pain, weight gain and denies syncope and denies belly pain, recent neuropathy and any other symptoms.   On PMHx the pt reports 5 cm hepatic flexure polyp removal, pneumonia, blood transfusion on 07/24/2018  On Social Hx the pt reports that he lives at home with his wife. He is from [place].    INTERVAL HISTORY:   [name] [name] is a wonderful 78 y.o. male who has who is here today for evaluation and management of newly diagnosed Plasma Cell Myeloma. Pt is here for C2D1 of Velcade.  The patient's last visit with [place] was on 06/12/2019. The pt reports that he is doing well overall.  The pt reports that last Friday he experienced  unthinkable pain in right and left ankles. He states that nothing is helping, not even the medication. He is has some tingling in hands,legs, and ankles. It was expressed that he does not like the compression socks and that after removing them his legs feel better. Swelling has improved with the help of medications.   The pain in his legs is worse at  night and it is keeping sleepingt. He states that there is some back pain but it is bearable compared to leg/ankle pain.  Lab results today (06/19/19) of CBC w/diff and CMP is as follows: all values are WNL except for platelets at 103, nRBC at 0.4, Abs Immature Granulocytes at 0.11, Glucose, Bld at 146, BUN at 32, Calcium at 8.7, GFR, and Est Non Af Am at 57.  On review of systems, pt reports constipation,  pain in ankles, some tingling in ankles, legs, and hands,  lack of appetite,  dyspnea when standing up too quickly and denies nausea, chest pain and any other symptoms.    MEDICAL HISTORY:  Past Medical History:  Diagnosis Date   Acute blood loss anemia    Adenomatous colon polyp    Ascending aortic aneurysm (HCC)    Bicuspid aortic valve    Chest pain    ETT-myoview 12/11 w/exercise, no chest pain, no significant ST changes, EF 69%, no evidence for ischemia or infarction.   CHF NYHA class I (no symptoms from ordinary activities), acute, diastolic (HCC)    Fatty liver    mild   GI bleed    GI bleeding 07/21/2018   post polypectomy   Hemorrhoids    HTN (hypertension)    Hypercholesteremia    Hypokalemia    Internal hemorrhoids    LBP (low back pain)    Moderate aortic stenosis    Osteoarthritis    Paroxysmal atrial fibrillation ([place])    a. new onset Afib in 07/2008. He underwent ibutilide cardioversion successfully. b. Recurrence 01/2013 s/p TEE/DCCV - was on Xarelto but he stopped it as he was convinced it was causing joint pn. c. Recurrence 01/2016 - spont conv to NSR. Pt took Eliquis x1 mo then declined further anticoag. d. Recurrence 07/2016.   Pneumonia    Tubular adenoma of colon     SURGICAL HISTORY: Past Surgical History:  Procedure Laterality Date   BACK SURGERY  x12 years ago   CARDIOVERSION N/A 01/26/2013   Procedure: CARDIOVERSION;  Surgeon: Larey Dresser, MD;  Location: [facility];  Service: Cardiovascular;  Laterality: N/A;   CARDIOVERSION N/A 10/28/2017   Procedure: CARDIOVERSION;  Surgeon: Larey Dresser, MD;  Location: [facility] ENDOSCOPY;  Service: Cardiovascular;  Laterality: N/A;   CARDIOVERSION N/A 03/03/2018   Procedure: CARDIOVERSION;  Surgeon: Lelon Perla, MD;  Location: [facility] ENDOSCOPY;  Service: Cardiovascular;  Laterality: N/A;   COLONOSCOPY     COLONOSCOPY  07/17/2018   at [place]     RIGHT/LEFT HEART CATH AND CORONARY ANGIOGRAPHY N/A 03/07/2019   Procedure: RIGHT/LEFT HEART CATH AND CORONARY ANGIOGRAPHY;  Surgeon: Burnell Blanks, MD;  Location: [place] [facility];  Service: Cardiovascular;  Laterality: N/A;   TEE WITHOUT CARDIOVERSION N/A 01/26/2013   Procedure: TRANSESOPHAGEAL ECHOCARDIOGRAM (TEE);  Surgeon: Larey Dresser, MD;  Location: [place];  Service: Cardiovascular;  Laterality: N/A;   TEE WITHOUT CARDIOVERSION N/A 10/28/2017   Procedure: TRANSESOPHAGEAL ECHOCARDIOGRAM (TEE);  Surgeon: Larey Dresser, MD;  Location: [facility] ENDOSCOPY;  Service: Cardiovascular;  Laterality: N/A;   TEE WITHOUT CARDIOVERSION N/A 05/08/2019   Procedure: TRANSESOPHAGEAL ECHOCARDIOGRAM (TEE);  Surgeon: Burnell Blanks, MD;  Location: [place] [facility];  Service: Open Heart Surgery;  Laterality: N/A;   TRANSCATHETER AORTIC VALVE REPLACEMENT, TRANSFEMORAL N/A 05/08/2019   Procedure: TRANSCATHETER AORTIC VALVE REPLACEMENT, TRANSFEMORAL;  Surgeon: Burnell Blanks, MD;  Location: [place] [facility];  Service: Open Heart Surgery;  Laterality: N/A;    SOCIAL HISTORY: Social History   Socioeconomic History   Marital status: Married    Spouse name: Not on file   Number of children: 0   Years of education: Not on file   Highest education level: Not on file  Occupational History   Occupation: Retired Lobbyist: [employer] resource strain: Not on file   Food insecurity    Worry: Not on file    Inability: Not on file   Transportation needs    Medical: Not on file    Non-medical: Not on file  Tobacco Use   Smoking status: Never Smoker   Smokeless tobacco: Never Used  Substance and Sexual Activity   Alcohol use: Yes    Comment: Drinks 1 glass of wine nightly/socially   Drug use: No   Sexual activity: Yes  Lifestyle   Physical activity    Days per week: Not on file     Minutes per session: Not on file   Stress: Not on file  Relationships   Social connections    Talks on phone: Not on file    Gets together: Not on file    Attends religious service: Not on file    Active member of club or organization: Not on file    Attends meetings of clubs or organizations: Not on file    Relationship status: Not on file   Intimate partner violence    Fear of current or ex partner: Not on file    Emotionally abused: Not on file    Physically abused: Not on file    Forced sexual activity: Not on file  Other Topics Concern   Not on file  Social History Narrative   Patient lives in [place] w/ his wife. He is a native of [place]. 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   Right-handed   Caffeine: 2 cups coffee per day    FAMILY HISTORY: Family History  Problem Relation Age of Onset   Colon cancer Mother 65   Hypertension Other    Coronary artery disease Neg Hx    Colon polyps Neg Hx    Esophageal cancer Neg Hx    Rectal cancer Neg Hx    Stomach cancer Neg Hx     ALLERGIES:  is allergic to xarelto [rivaroxaban]; corticosteroids; ramipril; and benazepril.  MEDICATIONS:  Current Outpatient Medications  Medication Sig Dispense Refill   acyclovir (ZOVIRAX) 400 MG tablet Take 1 tablet (400 mg total) by mouth 2 (two) times daily. 60 tablet 3   amLODipine (NORVASC) 5 MG tablet Take 1 tablet (5 mg total) by mouth daily. 90 tablet 3   apixaban (ELIQUIS) 5 MG TABS tablet Take 1 tablet (5 mg total) by mouth 2 (two) times daily. 60 tablet 6   aspirin 81 MG chewable tablet Chew 1 tablet (81 mg total) by mouth daily.     dexamethasone (DECADRON) 4 MG tablet Take 3 tablets (12 mg total) by mouth once a week. 20 tablet 6   famotidine (PEPCID) 40 MG tablet Take 1 tablet (40 mg total) by mouth daily. 30 tablet 11   furosemide (LASIX) 20 MG tablet Take 3 tablets (60 mg total) by mouth daily. 270 tablet 1    HYDROcodone-acetaminophen (NORCO/VICODIN) 5-325 MG tablet Take 1 tablet by mouth every 6 (six) hours  as needed for severe pain. 60 tablet 0   lenalidomide (REVLIMID) 15 MG capsule Take 1 capsule (15 mg total) by mouth daily. Take for 14 days on, 7 days off, repeat every 21 days. [employer] # [id number], 05/18/2019 14 capsule 0   loratadine (CLARITIN) 10 MG tablet Take 1 tablet (10 mg total) by mouth daily. 30 tablet 11   LORazepam (ATIVAN) 0.5 MG tablet Take 1 tablet (0.5 mg total) by mouth every 6 (six) hours as needed (Nausea or vomiting). 30 tablet 0   metoprolol succinate (TOPROL-XL) 25 MG 24 hr tablet Take 25 mg by mouth daily.     ondansetron (ZOFRAN) 8 MG tablet Take 1 tablet (8 mg total) by mouth 2 (two) times daily as needed (Nausea or vomiting). 30 tablet 1   predniSONE (DELTASONE) 5 MG tablet TAKE 3 TABLETS (15 MG TOTAL) BY MOUTH DAILY WITH BREAKFAST. 90 tablet 1   prochlorperazine (COMPAZINE) 10 MG tablet Take 1 tablet (10 mg total) by mouth every 6 (six) hours as needed (Nausea or vomiting). 30 tablet 1   senna-docusate (SENNA S) 8.6-50 MG tablet Take 2 tablets by mouth at bedtime. 60 tablet 2   spironolactone (ALDACTONE) 25 MG tablet Take 1 tablet (25 mg total) by mouth daily. 90 tablet 3   Vitamin D, Cholecalciferol, 50 MCG (2000 UT) CAPS Take 2,000 Units by mouth daily.     No current facility-administered medications for this visit.     REVIEW OF SYSTEMS:   A 10+ POINT REVIEW OF SYSTEMS WAS OBTAINED including neurology, dermatology, psychiatry, cardiac, respiratory, lymph, extremities, GI, GU, Musculoskeletal, constitutional, breasts, reproductive, HEENT.  All pertinent positives are noted in the HPI.  All others are negative.  +1 pedal edema   PHYSICAL EXAMINATION: ECOG FS:1 - Symptomatic but completely ambulatory  There were no vitals filed for this visit. Wt Readings from Last 3 Encounters:  06/12/19 194 lb 1.6 oz (88 kg)  06/11/19 194 lb 12.8 oz (88.4 kg)  06/06/19  193 lb 9.6 oz (87.8 kg)   There is no height or weight on file to calculate BMI.    GENERAL:alert, in no acute distress and comfortable SKIN: no acute rashes, no significant lesions EYES: conjunctiva are pink and non-injected, sclera anicteric OROPHARYNX: MMM, no exudates, no oropharyngeal erythema or ulceration NECK: supple, no JVD LYMPH:  no palpable lymphadenopathy in the cervical, axillary or inguinal regions LUNGS: clear to auscultation b/l with normal respiratory effort HEART: regular rate & rhythm ABDOMEN:  normoactive bowel sounds , non tender, not distended. Extremity: +1 pedal edema PSYCH: alert & oriented x 3 with fluent speech NEURO: no focal motor/sensory deficits  LABORATORY DATA:  I have reviewed the data as listed  . CBC Latest Ref Rng & Units 06/19/2019 06/12/2019 06/11/2019  WBC 4.0 - 10.5 K/uL 5.4 7.1 7.0  Hemoglobin 13.0 - 17.0 g/dL 13.4 12.9(L) 13.0  Hematocrit 39.0 - 52.0 % 40.7 39.2 41.0  Platelets 150 - 400 K/uL 103(L) 174 174    . CMP Latest Ref Rng & Units 06/19/2019 06/12/2019 06/11/2019  Glucose 70 - 99 mg/dL 146(H) 126(H) 120(H)  BUN 8 - 23 mg/dL 32(H) 21 20  Creatinine 0.61 - 1.24 mg/dL 1.20 1.24 1.17  Sodium 135 - 145 mmol/L 138 141 138  Potassium 3.5 - 5.1 mmol/L 4.1 3.8 4.1  Chloride 98 - 111 mmol/L 104 105 106  CO2 22 - 32 mmol/L 24 25 19(L)  Calcium 8.9 - 10.3 mg/dL 8.7(L) 8.6(L) 8.7(L)  Total Protein 6.5 -  8.1 g/dL 6.8 6.4(L) -  Total Bilirubin 0.3 - 1.2 mg/dL 0.5 0.8 -  Alkaline Phos 38 - 126 U/L 85 86 -  AST 15 - 41 U/L 22 25 -  ALT 0 - 44 U/L 23 23 -   Component     Latest Ref Rng & Units 04/30/2019  Total Protein, Urine-UPE24     Not Estab. mg/dL   Total Protein, Urine-Ur/day     30 - 150 mg/24 hr   ALBUMIN, U     %   ALPHA 1 URINE     %   Alpha 2, Urine     %   % BETA, Urine     %   GAMMA GLOBULIN URINE     %   Free Kappa Lt Chains,Ur     0.63 - 113.79 mg/L   Free Lambda Lt Chains,Ur     0.47 - 11.77 mg/L   Free  Kappa/Lambda Ratio     1.03 - 31.76   Immunofixation Result, Urine        Total Volume        M-SPIKE %, Urine     Not Observed %   M-Spike, mg/24 hr     Not Observed mg/24 hr   NOTE:        IgG (Immunoglobin G), Serum     603 - 1,613 mg/dL 1,740 (H)  IgA     61 - 437 mg/dL 16 (L)  IgM (Immunoglobulin M), Srm     15 - 143 mg/dL 9 (L)  Total Protein ELP     6.0 - 8.5 g/dL 7.3  Albumin SerPl Elph-Mcnc     2.9 - 4.4 g/dL 3.8  Alpha 1     0.0 - 0.4 g/dL 0.3  Alpha2 Glob SerPl Elph-Mcnc     0.4 - 1.0 g/dL 0.7  B-Globulin SerPl Elph-Mcnc     0.7 - 1.3 g/dL 0.9  Gamma Glob SerPl Elph-Mcnc     0.4 - 1.8 g/dL 1.6  M Protein SerPl Elph-Mcnc     Not Observed g/dL 1.5 (H)  Globulin, Total     2.2 - 3.9 g/dL 3.5  Albumin/Glob SerPl     0.7 - 1.7 1.1  IFE 1      Comment  Please Note (HCV):      Comment  Kappa free light chain     3.3 - 19.4 mg/L 821.2 (H)  Lamda free light chains     5.7 - 26.3 mg/L 1.9 (L)  Kappa, lamda light chain ratio     0.26 - 1.65 432.21 (H)  LDH     98 - 192 U/L 132  Sed Rate     0 - 16 mm/hr 3   05/24/2019 FISH Panel     05/24/2019 Bone Marrow Biopsy    04/16/2019 Surgical Pathology:   RADIOGRAPHIC STUDIES: I have personally reviewed the radiological images as listed and agreed with the findings in the report. Ct Biopsy  Result Date: 05/24/2019 INDICATION: 78 year old with multiple myeloma.  Request for bone marrow biopsy. EXAM: CT GUIDED BONE MARROW ASPIRATES AND BIOPSY Physician: Stephan Minister. Anselm Pancoast, MD MEDICATIONS: None. ANESTHESIA/SEDATION: Fentanyl 50 mcg IV; Versed 2.0 mg IV Moderate Sedation Time:  10 minutes The patient was continuously monitored during the procedure by the interventional radiology nurse under my direct supervision. COMPLICATIONS: None immediate. PROCEDURE: The procedure was explained to the patient. The risks and benefits of the procedure were discussed and the patient's questions were addressed. Informed  consent was  obtained from the patient. The patient was placed prone on CT table. Images of the pelvis were obtained. The right side of back was prepped and draped in sterile fashion. The skin and right posterior ilium were anesthetized with 1% lidocaine. 11 gauge bone needle was directed into the right ilium with CT guidance. Two aspirates and one core biopsy were obtained. Bandage placed over the puncture site. IMPRESSION: CT guided bone marrow aspiration and core biopsy. Electronically Signed   By: Markus Daft M.D.   On: 05/24/2019 11:19   Ct Bone Marrow Biopsy & Aspiration  Result Date: 05/24/2019 INDICATION: 78 year old with multiple myeloma.  Request for bone marrow biopsy. EXAM: CT GUIDED BONE MARROW ASPIRATES AND BIOPSY Physician: Stephan Minister. Anselm Pancoast, MD MEDICATIONS: None. ANESTHESIA/SEDATION: Fentanyl 50 mcg IV; Versed 2.0 mg IV Moderate Sedation Time:  10 minutes The patient was continuously monitored during the procedure by the interventional radiology nurse under my direct supervision. COMPLICATIONS: None immediate. PROCEDURE: The procedure was explained to the patient. The risks and benefits of the procedure were discussed and the patient's questions were addressed. Informed consent was obtained from the patient. The patient was placed prone on CT table. Images of the pelvis were obtained. The right side of back was prepped and draped in sterile fashion. The skin and right posterior ilium were anesthetized with 1% lidocaine. 11 gauge bone needle was directed into the right ilium with CT guidance. Two aspirates and one core biopsy were obtained. Bandage placed over the puncture site. IMPRESSION: CT guided bone marrow aspiration and core biopsy. Electronically Signed   By: Markus Daft M.D.   On: 05/24/2019 11:19    ASSESSMENT & PLAN:   78 yo   #1 Recently diagnosed Plasma cell myeloma  03/28/2019 MRI pelvis w/wo contrast revealed "1. Destructive bone lesions as detailed above. Findings most consistent with  metastatic disease. PET-CT may be helpful for further evaluation and to establish a primary tumor. The right pelvic bone lesions should be amenable to image guided biopsy but a PET scan may demonstrate easier/safer biopsy sites. 2. No intrapelvic mass or adenopathy. 3. Benign intraosseous lipoma involving the left anterior superior acetabulum."  04/06/2019 PET whole body revealed "1. Diffuse osseous metastatic disease as detailed above without findings for a primary neoplasm in the chest, abdomen or pelvis. The large destructive lesion involving the right ischium should be amenable to image guided biopsy. 2. Two small retroperitoneal lymph nodes and 1 small right obturator node showing hypermetabolism."   04/16/2019 Posterior right pelvis bone biopsy revealed PLASMA CELL NEOPLASM  05/24/2019 Bone Marrow Biopsy revealed "BONE MARROW: - CELLULAR MARROW WITH INVOLVEMENT BY PLASMA CELL NEOPLASM (20%) PERIPHERAL BLOOD: - MORPHOLOGICALLY UNREMARKABLE"  05/24/2019 FISH Panel revealed "ABNORMAL result with 11q+, 14q+ and +17"  #2 Severe aortic stenosis with bicuspid aortic valve -10/26/2018 ECHO revealed AVA at 0.8 cm2 and LV EF of 60-65% -05/08/2019 pt had a Transfemoral Transcatheter Aortic Valve Replacement   PLAN: A&P: -Discussed pt labwork today, 06/19/19; all values are WNL except for platelets at 103, nRBC at 0.4, Abs Immature Granulocytes at 0.11, Glucose, Bld at 146, BUN at 32, Calcium at 8.7, GFR, and Est Non Af Am at 57. -Discussed pt's past visit with cardiologist and there are no concerns for heart.  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 Change to once a week for three weeks then skip a week instead of twice a week.  -Advised that Neurontin will be prescribed to help with nerve pain -  Recommend taking B12 vitamin and B complex to help with the nerves -Advised to begin cetrizine or loratidine and to restart Revlimid start on 20th  -Discussed that Myeloma test were sent  out today to determine how he is responding to treatment.   The total time spent in the appt was 25 minutes and more than 50% was on counseling and direct patient cares.  All of the patient's questions were answered with apparent satisfaction. The patient knows to call the clinic with any problems, questions or concerns.    Sullivan Lone MD MS AAHIVMS [facility] Hematology/Oncology Physician [facility]  (Office):       [phone] (Work cell):  [phone] (Fax):           [phone]  06/21/2019 8:29 PM  I, Scot Dock, am acting as a scribe for Dr. Sullivan Lone.   .I have reviewed the above documentation for accuracy and completeness, and I agree with the above. Brunetta Genera MD

## 2019-06-22 ENCOUNTER — Inpatient Hospital Stay: Payer: Medicare Other

## 2019-06-22 ENCOUNTER — Other Ambulatory Visit: Payer: Self-pay

## 2019-06-22 ENCOUNTER — Telehealth (HOSPITAL_COMMUNITY): Payer: Self-pay

## 2019-06-22 ENCOUNTER — Inpatient Hospital Stay (HOSPITAL_BASED_OUTPATIENT_CLINIC_OR_DEPARTMENT_OTHER): Payer: Medicare Other | Admitting: Hematology

## 2019-06-22 VITALS — BP 154/83 | HR 83 | Temp 98.2°F | Resp 18 | Ht 71.0 in | Wt 194.1 lb

## 2019-06-22 DIAGNOSIS — Z5111 Encounter for antineoplastic chemotherapy: Secondary | ICD-10-CM | POA: Diagnosis not present

## 2019-06-22 DIAGNOSIS — C7951 Secondary malignant neoplasm of bone: Secondary | ICD-10-CM | POA: Diagnosis not present

## 2019-06-22 DIAGNOSIS — G629 Polyneuropathy, unspecified: Secondary | ICD-10-CM

## 2019-06-22 DIAGNOSIS — Z7189 Other specified counseling: Secondary | ICD-10-CM

## 2019-06-22 DIAGNOSIS — C9000 Multiple myeloma not having achieved remission: Secondary | ICD-10-CM

## 2019-06-22 DIAGNOSIS — R202 Paresthesia of skin: Secondary | ICD-10-CM | POA: Diagnosis not present

## 2019-06-22 DIAGNOSIS — M25559 Pain in unspecified hip: Secondary | ICD-10-CM | POA: Diagnosis not present

## 2019-06-22 DIAGNOSIS — R0609 Other forms of dyspnea: Secondary | ICD-10-CM | POA: Diagnosis not present

## 2019-06-22 MED ORDER — BORTEZOMIB CHEMO SQ INJECTION 3.5 MG (2.5MG/ML)
1.0000 mg/m2 | Freq: Once | INTRAMUSCULAR | Status: AC
  Administered 2019-06-22: 2 mg via SUBCUTANEOUS
  Filled 2019-06-22: qty 0.8

## 2019-06-22 MED ORDER — METOPROLOL SUCCINATE ER 25 MG PO TB24: 25.0000 mg | ORAL_TABLET | Freq: Two times a day (BID) | ORAL | 6 refills | Status: DC

## 2019-06-22 MED ORDER — GABAPENTIN 100 MG PO CAPS: 200.0000 mg | ORAL_CAPSULE | Freq: Every day | ORAL | 1 refills | Status: DC

## 2019-06-22 NOTE — Patient Instructions (Signed)
[facility] Discharge Instructions for Patients Receiving Chemotherapy  Today you received the following chemotherapy agents: Bortezomib (VELCADE).  To help prevent nausea and vomiting after your treatment, we encourage you to take your nausea medication as prescribed.   If you develop nausea and vomiting that is not controlled by your nausea medication, call the clinic.   BELOW ARE SYMPTOMS THAT SHOULD BE REPORTED IMMEDIATELY:  *FEVER GREATER THAN 100.5 F  *CHILLS WITH OR WITHOUT FEVER  NAUSEA AND VOMITING THAT IS NOT CONTROLLED WITH YOUR NAUSEA MEDICATION  *UNUSUAL SHORTNESS OF BREATH  *UNUSUAL BRUISING OR BLEEDING  TENDERNESS IN MOUTH AND THROAT WITH OR WITHOUT PRESENCE OF ULCERS  *URINARY PROBLEMS  *BOWEL PROBLEMS  UNUSUAL RASH Items with * indicate a potential emergency and should be followed up as soon as possible.  Feel free to call the clinic should you have any questions or concerns. The clinic phone number is [phone].  Please show the CHEMO ALERT CARD at check-in to the Emergency Department and triage nurse.   Coronavirus (COVID-19) Are you at risk?  Are you at risk for the Coronavirus (COVID-19)?  To be considered HIGH RISK for Coronavirus (COVID-19), you have to meet the following criteria:  . Traveled to [place], [place], [place], [place] or [place]; or in the [place] to [place], [place], [place], or [place]; and have fever, cough, and shortness of breath within the last 2 weeks of travel OR . Been in close contact with a person diagnosed with COVID-19 within the last 2 weeks and have fever, cough, and shortness of breath . IF YOU DO NOT MEET THESE CRITERIA, YOU ARE CONSIDERED LOW RISK FOR COVID-19.  What to do if you are HIGH RISK for COVID-19?  . If you are having a medical emergency, call 911. . Seek medical care right away. Before you go to a doctor's office, urgent care or emergency department, call ahead and tell them  about your recent travel, contact with someone diagnosed with COVID-19, and your symptoms. You should receive instructions from your physician's office regarding next steps of care.  . When you arrive at healthcare provider, tell the healthcare staff immediately you have returned from visiting [place], [place], [place], [place] or [place]; or traveled in the [place] to [place], [place], [place], or [place]; in the last two weeks or you have been in close contact with a person diagnosed with COVID-19 in the last 2 weeks.   . Tell the health care staff about your symptoms: fever, cough and shortness of breath. . After you have been seen by a medical provider, you will be either: o Tested for (COVID-19) and discharged home on quarantine except to seek medical care if symptoms worsen, and asked to  - Stay home and avoid contact with others until you get your results (4-5 days)  - Avoid travel on public transportation if possible (such as bus, train, or airplane) or o Sent to the Emergency Department by EMS for evaluation, COVID-19 testing, and possible admission depending on your condition and test results.  What to do if you are LOW RISK for COVID-19?  Reduce your risk of any infection by using the same precautions used for avoiding the common cold or flu:  . Wash your hands often with soap and warm water for at least 20 seconds.  If soap and water are not readily available, use an alcohol-based hand sanitizer with at least 60% alcohol.  . If coughing   or sneezing, cover your mouth and nose by coughing or sneezing into the elbow areas of your shirt or coat, into a tissue or into your sleeve (not your hands). . Avoid shaking hands with others and consider head nods or verbal greetings only. . Avoid touching your eyes, nose, or mouth with unwashed hands.  . Avoid close contact with people who are sick. . Avoid places or events with large numbers of people in one location, like concerts or  sporting events. . Carefully consider travel plans you have or are making. . If you are planning any travel outside or inside the [place], visit the CDC's Travelers' Health webpage for the latest health notices. . If you have some symptoms but not all symptoms, continue to monitor at home and seek medical attention if your symptoms worsen. . If you are having a medical emergency, call 911.   ADDITIONAL HEALTHCARE OPTIONS FOR PATIENTS  [facility] / e-Visit: [email or web address]         [facility]: [phone]   Urgent Care: [phone]                   [facility]: [phone]   

## 2019-06-22 NOTE — Telephone Encounter (Signed)
Pt aware of results of long term monitor and med changes. Verbalizes understanding. Pt will be seen by MD on day of visit with NP.  Ok per Dr Aundra Dubin.

## 2019-06-22 NOTE — Telephone Encounter (Signed)
-----   Message from Larey Dresser, MD sent at 06/22/2019  9:59 AM EDT ----- Increase PACs and PVCs. Average HR 70.  Have him increase Toprol XL to 25 mg bid to try to suppress.  If HR tolerates, may go higher.

## 2019-06-22 NOTE — Progress Notes (Signed)
Pt took his own compazine today.

## 2019-06-23 ENCOUNTER — Telehealth: Payer: Self-pay | Admitting: Hematology

## 2019-06-23 NOTE — Telephone Encounter (Signed)
Per 10/9 los Today 1 unit of Velcade

## 2019-06-29 ENCOUNTER — Other Ambulatory Visit: Payer: Self-pay | Admitting: Hematology

## 2019-07-01 NOTE — Progress Notes (Signed)
    He refused visit with Nurse Practitioner. Will reschedule.   Amy Clegg 10:50 AM

## 2019-07-02 ENCOUNTER — Encounter (HOSPITAL_COMMUNITY): Payer: Self-pay

## 2019-07-02 ENCOUNTER — Ambulatory Visit (HOSPITAL_COMMUNITY)
Admission: RE | Admit: 2019-07-02 | Discharge: 2019-07-02 | Disposition: A | Discharge status: Home / Self Care | Payer: Medicare Other | Source: Ambulatory Visit | Attending: Adult Health | Admitting: Adult Health

## 2019-07-02 ENCOUNTER — Other Ambulatory Visit: Payer: Self-pay

## 2019-07-02 VITALS — BP 146/82 | HR 81 | Wt 191.6 lb

## 2019-07-02 DIAGNOSIS — Z8249 Family history of ischemic heart disease and other diseases of the circulatory system: Secondary | ICD-10-CM | POA: Diagnosis not present

## 2019-07-02 DIAGNOSIS — Z79899 Other long term (current) drug therapy: Secondary | ICD-10-CM | POA: Insufficient documentation

## 2019-07-02 DIAGNOSIS — M79671 Pain in right foot: Secondary | ICD-10-CM | POA: Diagnosis not present

## 2019-07-02 DIAGNOSIS — M79661 Pain in right lower leg: Secondary | ICD-10-CM | POA: Diagnosis not present

## 2019-07-02 DIAGNOSIS — M79672 Pain in left foot: Secondary | ICD-10-CM | POA: Insufficient documentation

## 2019-07-02 DIAGNOSIS — I480 Paroxysmal atrial fibrillation: Secondary | ICD-10-CM

## 2019-07-02 DIAGNOSIS — I471 Supraventricular tachycardia: Secondary | ICD-10-CM | POA: Diagnosis not present

## 2019-07-02 DIAGNOSIS — M1990 Unspecified osteoarthritis, unspecified site: Secondary | ICD-10-CM | POA: Insufficient documentation

## 2019-07-02 DIAGNOSIS — E7800 Pure hypercholesterolemia, unspecified: Secondary | ICD-10-CM | POA: Insufficient documentation

## 2019-07-02 DIAGNOSIS — D696 Thrombocytopenia, unspecified: Secondary | ICD-10-CM | POA: Diagnosis not present

## 2019-07-02 DIAGNOSIS — M79662 Pain in left lower leg: Secondary | ICD-10-CM | POA: Insufficient documentation

## 2019-07-02 DIAGNOSIS — I4510 Unspecified right bundle-branch block: Secondary | ICD-10-CM | POA: Insufficient documentation

## 2019-07-02 DIAGNOSIS — C9000 Multiple myeloma not having achieved remission: Secondary | ICD-10-CM | POA: Insufficient documentation

## 2019-07-02 DIAGNOSIS — Q231 Congenital insufficiency of aortic valve: Secondary | ICD-10-CM | POA: Insufficient documentation

## 2019-07-02 DIAGNOSIS — I5032 Chronic diastolic (congestive) heart failure: Secondary | ICD-10-CM

## 2019-07-02 DIAGNOSIS — Z8719 Personal history of other diseases of the digestive system: Secondary | ICD-10-CM | POA: Insufficient documentation

## 2019-07-02 DIAGNOSIS — I359 Nonrheumatic aortic valve disorder, unspecified: Secondary | ICD-10-CM | POA: Diagnosis not present

## 2019-07-02 DIAGNOSIS — Z7982 Long term (current) use of aspirin: Secondary | ICD-10-CM | POA: Diagnosis not present

## 2019-07-02 DIAGNOSIS — Z7901 Long term (current) use of anticoagulants: Secondary | ICD-10-CM | POA: Insufficient documentation

## 2019-07-02 DIAGNOSIS — I2510 Atherosclerotic heart disease of native coronary artery without angina pectoris: Secondary | ICD-10-CM | POA: Diagnosis not present

## 2019-07-02 DIAGNOSIS — I110 Hypertensive heart disease with heart failure: Secondary | ICD-10-CM | POA: Diagnosis not present

## 2019-07-02 DIAGNOSIS — Z952 Presence of prosthetic heart valve: Secondary | ICD-10-CM | POA: Insufficient documentation

## 2019-07-02 DIAGNOSIS — R531 Weakness: Secondary | ICD-10-CM

## 2019-07-02 LAB — BASIC METABOLIC PANEL
Anion gap: 10 (ref 5–15)
BUN: 23 mg/dL (ref 8–23)
CO2: 23 mmol/L (ref 22–32)
Calcium: 8.9 mg/dL (ref 8.9–10.3)
Chloride: 104 mmol/L (ref 98–111)
Creatinine, Ser: 0.98 mg/dL (ref 0.61–1.24)
GFR calc Af Amer: >60 mL/min (ref >60)
GFR calc non Af Amer: >60 mL/min (ref >60)
Glucose, Bld: 145 mg/dL — ABNORMAL HIGH (ref 70–99)
Potassium: 3.7 mmol/L (ref 3.5–5.1)
Sodium: 137 mmol/L (ref 135–145)

## 2019-07-02 LAB — VITAMIN B12: Vitamin B-12: 518 pg/mL (ref 180–914)

## 2019-07-02 LAB — MAGNESIUM: Magnesium: 2.3 mg/dL (ref 1.7–2.4)

## 2019-07-02 NOTE — Patient Instructions (Signed)
Lab work done today. We will notify you of any abnormal lab work. No news is good news!  Please follow up with the [facility] in 3 months.  At the [facility], you and your health needs are our priority. As part of our continuing mission to provide you with exceptional heart care, we have created designated Provider Care Teams. These Care Teams include your primary Cardiologist (physician) and Advanced Practice Providers (APPs- Physician Assistants and Nurse Practitioners) who all work together to provide you with the care you need, when you need it.   You may see any of the following providers on your designated Care Team at your next follow up: [name] Dr Glori Bickers . Dr [name] . Darrick Grinder, NP . Lyda Jester, PA   Please be sure to bring in all your medications bottles to every appointment.

## 2019-07-02 NOTE — Progress Notes (Signed)
Date:  07/02/2019  ID:  [name] [name], DOB April 04, 1941, MRN [id number]  Provider location: [facility] Type of Visit: Established patient   PCP:  Cassandria Anger, MD  Cardiologist: Dr. Aundra Dubin  Chief Complaint: Shortness of breath   History of Present Illness: [name] [name] is a 78 y.o. male who has history of HTN, aortic stenosis and paroxysmal atrial fibrillation. He was hospitalized with atrial fibrillation/RVR in 5/14.  He had TEE-guided cardioversion.  TEE showed bicuspid aortic valve with mild AS and a moderately dilated ascending aorta.  Most echo in 4/17 showed moderate aortic stenosis and MRA chest in 11/17 showed 4.1 cm ascending aorta.   He was on Xarelto for anticoagulation but stopped it as he was convinced it was causing joint pains.  He then refused to start any other anticoagulation at that time.    In 5/17, he had been back in atrial fibrillation for several days and was symptomatic.  I started him on Eliquis and planned TEE-guided DCCV given significant symptoms, but he converted back to NSR on his own.  He continued Eliquis for about 1 month then stopped it on his own.    In 11/17, he was hospitalized with symptomatic atrial fibrillation with RVR.  I started him on diltiazem CD and Eliquis with plan for TEE-guided DCCV.  However, he converted back to NSR on his own. He stopped the diltiazem but has continued the Eliquis.   He was admitted in 2/18 with fever, LUL PNA and left-sided pleural effusion.  Thoracentesis on left was suggestive of parapneumonic effusion.  He was in the hospital 11 days.  During that time, he went into atrial fibrillation with RVR.  He was started on amiodarone and went back into NSR.  Amiodarone was subsequently stopped.   Recurrent atrial fibrillation with RVR in 2/19, felt more fatigued.  He underwent TEE-guided DCCV back to NSR.   He had a lower GI bleed in 11/19 post-polypectomy.  He has since restarted on  Eliquis.   Coronary CTA was done in 2/20, this showed mild nonobstructive CAD.  Echo in 2/20 showed EF 60-65%, bicuspid aortic valve with severe AS.   LHC in 6/20 showed no significant coronary disease.  In 8/20, he had TAVR with [place] 3 THV x 2 (valve in valve due to peri-valvular leak initially).  Post-procedure echoes have shown elevated gradient across the aortic valve though dimensionless index has only been in the mildly stenotic range.  Last echo in 9/20 showed mean aortic valve gradient 26 mmHg with dimensionless index 0.54.   Patient has additionally been diagnosed recently with multiple myeloma.  He is getting treated with Revlimid, Velcade, and prednisone.   At a prior appointment, he was noted to be volume overloaded and thrombocytopenic.  Eliquis and ASA were held and Lasix was started.  Toprol XL was decreased due to bradycardia.  Platelets trended up (fall was likely due to multiple myeloma treatment) and he was restarted on apixaban and ASA 81.   Zio patch in 9/20 showed 9.4% PVCs and occasional short runs of SVT.  Toprol XL was increased to 25 mg bid.    Patient returns for followup of aortic stenosis and atrial fibrillation.  Weight is down 3 lbs.  He is not feeling any palpitations now.  No dyspnea when walking on flat ground.  He is not taking spironolactone, he says that it made him feel dizzy and lightheaded. Main complaint is pain throughout  his feet and lower legs bilaterally.  He is sleeping poorly because of the pain. He was started on gabapentin but had side effects and stopped it.  No chest pain. Decreased lower leg edema.   ECG (personally reviewed): NSR, inferolateral TWIs, lateral Qs  Labs (5/14): K 3.7, creatinine 0.9, BNP 2261=>109, TSH normal Labs (7/14): K 3.5, creatinine 1.0 Labs (12/14): K 3.8, creatinine 1.0, LDL particle number 1374, LDL 107, TSH normal Labs (6/16): TSH normal, K 3.8, creatinine 0.84, HCT 42.4, LDL 85, LFTs normal Labs (3/17): K  3.8, creatinine 0.87 Labs (5/17): K 4.5, creatinine 0.97, HCT 43.7 Labs (11/17): K 3.2, creatinine 0.81, LDL 71, HDL 24 Labs (2/18): K 3.8, creatinine 0.89 Labs (2/19): K 3.7, creatinine 0.83, hgb 16 Labs (11/19): LDL 82 Labs (1/20): TSH elevated but free T4 normal, K 4, creatinine 0.85, hgb 13.5 Labs (2/20): TSH mildly elevated but free T4 normal, K 4, creatinine 0.85 Labs (5/20): ESR 48 Labs (9/20): hgb 14.1, plts 38 => 82, K 3.3, creatinine 0.98 => 1.06, BNP 508 Labs (10/20): hgb 13.4, K 4.1, creatinine 1.2  Allergies (verified):  No Known Drug Allergies   Past Medical History:  1. Hypertension: ACEI cough.  2. Atrial fibrillation. The patient had new-onset atrial fibrillation in November 2009. He underwent ibutilide cardioversion successfully. He has had 1-2 episodes/year that are short-lived that likely are atrial fibrillation with RVR. He was admitted in 5/14 with atrial fibrillation/RVR and had TEE-guided DCCV.  Atrial fibrillation again in 5/17, converted back to NSR spontaneously.  CHADSVASC score 2.  - Atrial fibrillation 2/19 with TEE-guided DCCV.  3. Hypercholesterolemia.  4. Bicuspid aortic valve disorder: Echo (7/11): EF 55-60%, mild LV hypertrophy, mild aortic stenosis with mean gradient 19 mmHg and peak gradient 36 mmHg.  TEE (5/14): EF 55%, mild LVH, bicuspid aortic valve with mild AS (mean gradient 13 mmHg), ascending aorta 4.5 cm.  Echo (9/15) with EF 65-70%, moderate AS (mean gradient 23 mmHg), mild AI, ascending aorta 4.1 cm, mild MR.   - Echo (4/17) with EF 65-70%, moderate aortic stenosis with mean gradient 27 mmHg, PASP 31 mmHg, ascending aorta 4.4 cm.  - Echo (2/18) with EF 55-60%, bicuspid aortic valve with moderate aortic stenosis (underestimated gradient).  - TEE (2/19): EF 60-65%, moderate LVH, bicuspid aortic valve with moderate AS with mean gradient 28 mmHg and AVA 1.2 cm^2, 4.4 cm ascending aorta.  - Echo (2/20): EF 60-65%, mild LVH, bicuspid aortic valve  with severe AS (mean gradient 48 mmHg, AVA 0.8 cm^2).  - TAVR 8/20 with valve in valve [place] 3 THVs (because of peri-valvular leak after initial valve placed).  - Echo (9/20): EF > 65%, mild LVH, mild RV dilation with normal RV systolic function, mean aortic valve gradient 26 mmHg with dimensionless index 0.54, IVC normal.  5. Osteoarthritis.  6. Low back pain.  7. Chest pain: ETT-myoview (12/11) with 10:51 exercise, no chest pain, no significant ST changes, EF 69%, no evidence for ischemia or infarction.  - Coronary CTA (2/20): calcium score 41, nonobstructive CAD.  - LHC (6/20): No significant CAD.  8. Ascending aortic aneurysm: Associated with bicuspid aortic valve.  4.5 cm by TEE in 5/14.   MRA chest (6/14) with bicuspid aortic valve, 4.3 cm ascending aortic aneurysm.  MRA chest (10/15) with 4.2 cm ascending aorta (bicuspid aortic valve noted).  Echo (4/17) with ascending aorta diameter 4.4 cm.  - MRA chest (11/17) with 4.1 cm ascending aorta.  - Echo (2/19): 4.4 cm ascending  aorta.  - Coronary CTA in 2/20 showed 4.3 cm ascending aorta.  9. Colonic polyps: GI bleeding in 11/19 s/p polypectomy.  10. Multiple myeloma.  11. Bradycardia 12. Thrombocytopenia: likely related to chemotherapy for multiple myeloma.  13. PVCs: 3 days Zio patch in 9/20 with 9.4% PVCs, occasional short SVT runs.    Current Outpatient Medications  Medication Sig Dispense Refill  . acyclovir (ZOVIRAX) 400 MG tablet Take 1 tablet (400 mg total) by mouth 2 (two) times daily. 60 tablet 3  . amLODipine (NORVASC) 5 MG tablet Take 1 tablet (5 mg total) by mouth daily. 90 tablet 3  . apixaban (ELIQUIS) 5 MG TABS tablet Take 1 tablet (5 mg total) by mouth 2 (two) times daily. 60 tablet 6  . aspirin 81 MG chewable tablet Chew 1 tablet (81 mg total) by mouth daily.    [name] dexamethasone (DECADRON) 4 MG tablet Take 3 tablets (12 mg total) by mouth once a week. 20 tablet 6  . famotidine (PEPCID) 40 MG tablet Take 1 tablet  (40 mg total) by mouth daily. 30 tablet 11  . furosemide (LASIX) 20 MG tablet Take 3 tablets (60 mg total) by mouth daily. 270 tablet 1  . gabapentin (NEURONTIN) 100 MG capsule Take 2 capsules (200 mg total) by mouth at bedtime. 60 capsule 1  . HYDROcodone-acetaminophen (NORCO/VICODIN) 5-325 MG tablet Take 1 tablet by mouth every 6 (six) hours as needed for severe pain. 60 tablet 0  . lenalidomide (REVLIMID) 15 MG capsule Take 1 capsule (15 mg total) by mouth daily. Take for 14 days on, 7 days off, repeat every 21 days. [employer] # [id number], 05/18/2019 14 capsule 0  . loratadine (CLARITIN) 10 MG tablet Take 1 tablet (10 mg total) by mouth daily. 30 tablet 11  . LORazepam (ATIVAN) 0.5 MG tablet Take 1 tablet (0.5 mg total) by mouth every 6 (six) hours as needed (Nausea or vomiting). 30 tablet 0  . metoprolol succinate (TOPROL-XL) 25 MG 24 hr tablet Take 1 tablet (25 mg total) by mouth 2 (two) times daily. 60 tablet 6  . ondansetron (ZOFRAN) 8 MG tablet Take 1 tablet (8 mg total) by mouth 2 (two) times daily as needed (Nausea or vomiting). 30 tablet 1  . prochlorperazine (COMPAZINE) 10 MG tablet Take 1 tablet (10 mg total) by mouth every 6 (six) hours as needed (Nausea or vomiting). 30 tablet 1  . senna-docusate (SENNA S) 8.6-50 MG tablet Take 2 tablets by mouth at bedtime. 60 tablet 2  . spironolactone (ALDACTONE) 25 MG tablet Take 1 tablet (25 mg total) by mouth daily. 90 tablet 3  . Vitamin D, Cholecalciferol, 50 MCG (2000 UT) CAPS Take 2,000 Units by mouth daily.     No current facility-administered medications for this encounter.     Allergies:   Xarelto [rivaroxaban], Corticosteroids, Ramipril, and Benazepril   Social History:  The patient  reports that he has never smoked. He has never used smokeless tobacco. He reports current alcohol use. He reports that he does not use drugs.   Family History:  The patient's family history includes Colon cancer (age of onset: 80) in his mother; Hypertension  in an other family member.   ROS:  Please see the history of present illness.   All other systems are personally reviewed and negative.   Exam:   BP (!) 146/82   Pulse 81   Wt 86.9 kg (191 lb 9.6 oz)   SpO2 98%   BMI 26.72 kg/m  General:  NAD Neck: No JVD, no thyromegaly or thyroid nodule.  Lungs: Clear to auscultation bilaterally with normal respiratory effort. CV: Nondisplaced PMI.  Heart regular S1/S2, no S3/S4, 2/6 early SEM RUSB.  1+ ankle edema.  No carotid bruit.  Normal pedal pulses.  Abdomen: Soft, nontender, no hepatosplenomegaly, no distention.  Skin: Intact without lesions or rashes.  Neurologic: Alert and oriented x 3.  Psych: Normal affect. Extremities: No clubbing or cyanosis.  HEENT: Normal.   Recent Labs: 10/03/2018: TSH 4.76 06/11/2019: B Natriuretic Peptide 508.3 06/19/2019: ALT 23; Hemoglobin 13.4; Platelets 103 07/02/2019: BUN 23; Creatinine, Ser 0.98; Magnesium 2.3; Potassium 3.7; Sodium 137  Personally reviewed   Wt Readings from Last 3 Encounters:  07/02/19 86.9 kg (191 lb 9.6 oz)  06/22/19 88 kg (194 lb 1.6 oz)  06/12/19 88 kg (194 lb 1.6 oz)      ASSESSMENT AND PLAN:  1. Thrombocytopenia: Mild now. Suspect this was related to his multiple myeloma treatment.  He is back on ASA 81 and apixaban.  2. Atrial fibrillation: Paroxysmal. He is quite symptomatic when in atrial fibrillation.  CHADSVASC = 3.  He is in NSR today.   - Continue Eliquis.  - He has not wanted atrial fibrillation ablation.   3. Bradycardia: After TAVR, patient had RBBB and LAFB.  RBBB has resolved, and he is no longer bradycardic (HR 81 today).  4. HTN: BP reasonably controlled.   - Continue amlodipine 5 mg daily.   - He did not tolerate spironolactone.  5. Bicuspid aortic valve disorder: Ascending aorta was 4.3 cm on recent coronary CTA. He is now s/p TAVR with 2 [place] (valve-in-valve due to peri-valvular leak after 1st valve placed). Post-op, mean gradient across the  aortic valve has been elevated, most recently 26 mmHg in 9/20.  Dimensionless index, most recently 0.54, only suggests mild bioprosthetic aortic stenosis.  I suspect the elevated gradient is due to a combination of high flow and patient-prosthetic mismatch due to valve-in-valve placement.  - He has been on ASA 81 + Eliquis post-TAVR, these meds were restarted with improved platelets.   6. Chronic diastolic CHF: Volume status much improved today and weight down.  Mild peripheral edema but no JVD.  - Continue Lasix 60 mg daily.  BMET today.   7. Multiple myeloma: He is on Revlimid, prednisone, and Velcade per Dr. Irene Limbo.  8. Bilateral foot/lower leg pain: Normal pedal pulses so not likely due to PAD.  Possible neuropathic pain.  He did not tolerate gabapentin.  - He does not want to try Lyrica.  - Check B12 level.   Followup in 3 months.   Signed, [name], MD  07/02/2019  [place] [address] [facility] [place] [address] [phone] (office) [phone] (fax)

## 2019-07-02 NOTE — Progress Notes (Signed)
HEMATOLOGY/ONCOLOGY CLINIC NOTE  Date of Service: 07/03/2019  Patient Care Team: Cassandria Anger, MD as PCP - General Erline Levine, MD as Attending Physician (Neurosurgery) Larey Dresser, MD (Cardiology) Jarome Matin, MD as Consulting Physician (Dermatology) Rigoberto Noel, MD as Consulting Physician (Pulmonary Disease)   CHIEF COMPLAINTS/PURPOSE OF CONSULTATION:  Recently diagnosed Plasma cell myeloma   HISTORY OF PRESENTING ILLNESS:  [name] [name] is a wonderful 78 y.o. male who has been referred to [place] by Angelena Form, PA for evaluation and management of lytic bone lesions. The pt reports that he is doing well overall.  The pt reports that he has occasional hip pain that radiates down his legs and prevents him from walking. He uses Advil, which helps his hip pain. He is used to exercising often, but has not been able to stay very physically active lately so he is gaining weight. The pt experiences some SOB when he wakes up in the morning.  The pt had a pre-procedural CTA C/A/P before a TAVR completed on 03/14/2019 which revealed "indeterminate osseus lesions in the bony pelvis," which led to an MRI and PET scan. He reports that he has pain when he pushes on his right chest.   He also notes that he had a fall while exercising in 06/2018 and thought he broke his back. He received a blood transfusion on 07/24/2018.   Of note prior to the patient's visit today, the pt has had a MRI pelvis w/wo contrast completed on 03/28/2019 with results revealing "1. Destructive bone lesions as detailed above. Findings most consistent with metastatic disease. PET-CT may be helpful for further evaluation and to establish a primary tumor. The right pelvic bone lesions should be amenable to image guided biopsy but a PET scan may demonstrate easier/safer biopsy sites. 2. No intrapelvic mass or adenopathy. 3. Benign intraosseous lipoma involving the left anterior superior acetabulum."  The pt  has also had PET whole body completed on 04/06/2019 with results revealing "1. Diffuse osseous metastatic disease as detailed above without findings for a primary neoplasm in the chest, abdomen or pelvis. The large destructive lesion involving the right ischium should be amenable to image guided biopsy. 2. Two small retroperitoneal lymph nodes and 1 small right obturator node showing hypermetabolism."   Most recent lab results (04/06/2019) of CBC is as follows: all values are WNL.  On review of systems, pt reports hip and leg pain, weight gain and denies syncope and denies belly pain, recent neuropathy and any other symptoms.   On PMHx the pt reports 5 cm hepatic flexure polyp removal, pneumonia, blood transfusion on 07/24/2018  On Social Hx the pt reports that he lives at home with his wife. He is from [place].    INTERVAL HISTORY:  [name] [name] is a wonderful 78 y.o. male who has who is here today for evaluation and management of newly diagnosed Plasma Cell Myeloma. Pt is here for C3D1 of Velcade. The patient's last visit with [place] was on 06/22/2019. The pt reports that he is doing well overall.  The pt reports that he feels well today but has been having a difficult time with some of his medications. Pt stopped taking Gabapentin because it was causing nerve pain that worsened at night when he was trying to sleep. He did not take Hydrocodone for pain relief when this occurred. Since stopping Gabapentin his pain has subsided. He does still have some tingling in his feet and toes. He also stopped taking Spirolactone due  to him becoming light headed. He reports that his leg swelling has improved but does not believe that compression socks help and he has stopped using them. He is scheduled to begin Revlimid today. He has been taking Loratadine and Famotidine as prescribed. Pt has been taking Asprin and Eliquis and has felt an improvement in his breathing. Pt has continued to take his Vitamin D 50 mcg  per day. He does take Hydrocodone as needed to manage his back and hip pain as well as massage.   Lab results today (07/03/19) of CBC w/diff and CMP is as follows: all values are WNL except for WBC at 21.8K, RBC at 3.94, Hgb at 12.0, HCT at 36.4, Neutro Abs at 18.6K, Mono Abs at 2.0K, Abs Immature Granulocytes at 0.23K, Glucose at 110, BUN at 49, GFR Est Non Af Am at 55. 07/03/2019 K/L light chains is in progress  07/03/2019 MMP is in progress   On review of systems, pt reports improving leg swelling, numbness in feet & toes, hip pain, back pain and denies fevers, chills, infection issues, SOB and any other symptoms.   MEDICAL HISTORY:  Past Medical History:  Diagnosis Date   Acute blood loss anemia    Adenomatous colon polyp    Ascending aortic aneurysm (HCC)    Bicuspid aortic valve    Chest pain    ETT-myoview 12/11 w/exercise, no chest pain, no significant ST changes, EF 69%, no evidence for ischemia or infarction.   CHF NYHA class I (no symptoms from ordinary activities), acute, diastolic (HCC)    Fatty liver    mild   GI bleed    GI bleeding 07/21/2018   post polypectomy   Hemorrhoids    HTN (hypertension)    Hypercholesteremia    Hypokalemia    Internal hemorrhoids    LBP (low back pain)    Moderate aortic stenosis    Osteoarthritis    Paroxysmal atrial fibrillation ([place])    a. new onset Afib in 07/2008. He underwent ibutilide cardioversion successfully. b. Recurrence 01/2013 s/p TEE/DCCV - was on Xarelto but he stopped it as he was convinced it was causing joint pn. c. Recurrence 01/2016 - spont conv to NSR. Pt took Eliquis x1 mo then declined further anticoag. d. Recurrence 07/2016.   Pneumonia    Tubular adenoma of colon     SURGICAL HISTORY: Past Surgical History:  Procedure Laterality Date   BACK SURGERY  x12 years ago   CARDIOVERSION N/A 01/26/2013   Procedure: CARDIOVERSION;  Surgeon: Larey Dresser, MD;  Location: [facility];  Service:  Cardiovascular;  Laterality: N/A;   CARDIOVERSION N/A 10/28/2017   Procedure: CARDIOVERSION;  Surgeon: Larey Dresser, MD;  Location: [facility] ENDOSCOPY;  Service: Cardiovascular;  Laterality: N/A;   CARDIOVERSION N/A 03/03/2018   Procedure: CARDIOVERSION;  Surgeon: Lelon Perla, MD;  Location: [facility] ENDOSCOPY;  Service: Cardiovascular;  Laterality: N/A;   COLONOSCOPY     COLONOSCOPY  07/17/2018   at [facility]     RIGHT/LEFT HEART CATH AND CORONARY ANGIOGRAPHY N/A 03/07/2019   Procedure: RIGHT/LEFT HEART CATH AND CORONARY ANGIOGRAPHY;  Surgeon: Burnell Blanks, MD;  Location: [place] CV LAB;  Service: Cardiovascular;  Laterality: N/A;   TEE WITHOUT CARDIOVERSION N/A 01/26/2013   Procedure: TRANSESOPHAGEAL ECHOCARDIOGRAM (TEE);  Surgeon: Larey Dresser, MD;  Location: [place];  Service: Cardiovascular;  Laterality: N/A;   TEE WITHOUT CARDIOVERSION N/A  10/28/2017   Procedure: TRANSESOPHAGEAL ECHOCARDIOGRAM (TEE);  Surgeon: Larey Dresser, MD;  Location: [facility] ENDOSCOPY;  Service: Cardiovascular;  Laterality: N/A;   TEE WITHOUT CARDIOVERSION N/A 05/08/2019   Procedure: TRANSESOPHAGEAL ECHOCARDIOGRAM (TEE);  Surgeon: Burnell Blanks, MD;  Location: [place] CV LAB;  Service: Open Heart Surgery;  Laterality: N/A;   TRANSCATHETER AORTIC VALVE REPLACEMENT, TRANSFEMORAL N/A 05/08/2019   Procedure: TRANSCATHETER AORTIC VALVE REPLACEMENT, TRANSFEMORAL;  Surgeon: Burnell Blanks, MD;  Location: [place] CV LAB;  Service: Open Heart Surgery;  Laterality: N/A;    SOCIAL HISTORY: Social History   Socioeconomic History   Marital status: Married    Spouse name: Not on file   Number of children: 0   Years of education: Not on file   Highest education level: Not on file  Occupational History   Occupation: Retired Lobbyist: [employer] resource strain:  Not on file   Food insecurity    Worry: Not on file    Inability: Not on file   Transportation needs    Medical: Not on file    Non-medical: Not on file  Tobacco Use   Smoking status: Never Smoker   Smokeless tobacco: Never Used  Substance and Sexual Activity   Alcohol use: Yes    Comment: Drinks 1 glass of wine nightly/socially   Drug use: No   Sexual activity: Yes  Lifestyle   Physical activity    Days per week: Not on file    Minutes per session: Not on file   Stress: Not on file  Relationships   Social connections    Talks on phone: Not on file    Gets together: Not on file    Attends religious service: Not on file    Active member of club or organization: Not on file    Attends meetings of clubs or organizations: Not on file    Relationship status: Not on file   Intimate partner violence    Fear of current or ex partner: Not on file    Emotionally abused: Not on file    Physically abused: Not on file    Forced sexual activity: Not on file  Other Topics Concern   Not on file  Social History Narrative   Patient lives in [place] w/ his wife. He is a native of [place]. 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   Right-handed   Caffeine: 2 cups coffee per day    FAMILY HISTORY: Family History  Problem Relation Age of Onset   Colon cancer Mother 66   Hypertension Other    Coronary artery disease Neg Hx    Colon polyps Neg Hx    Esophageal cancer Neg Hx    Rectal cancer Neg Hx    Stomach cancer Neg Hx     ALLERGIES:  is allergic to xarelto [rivaroxaban]; corticosteroids; ramipril; and benazepril.  MEDICATIONS:  Current Outpatient Medications  Medication Sig Dispense Refill   acyclovir (ZOVIRAX) 400 MG tablet Take 1 tablet (400 mg total) by mouth 2 (two) times daily. 60 tablet 3   amLODipine (NORVASC) 5 MG tablet Take 1 tablet (5 mg total) by mouth daily. 90 tablet 3   apixaban (ELIQUIS) 5 MG TABS  tablet Take 1 tablet (5 mg total) by mouth 2 (two) times daily. 60 tablet 6   aspirin 81 MG chewable tablet Chew 1 tablet (81 mg total)  by mouth daily.     dexamethasone (DECADRON) 4 MG tablet Take 3 tablets (12 mg total) by mouth once a week. 20 tablet 6   DULoxetine (CYMBALTA) 20 MG capsule Take 1 capsule (20 mg total) by mouth daily. 30 capsule 1   ergocalciferol (VITAMIN D2) 1.25 MG (50000 UT) capsule Take 1 capsule (50,000 Units total) by mouth once a week. 12 capsule 3   famotidine (PEPCID) 40 MG tablet Take 1 tablet (40 mg total) by mouth daily. 30 tablet 11   furosemide (LASIX) 20 MG tablet Take 3 tablets (60 mg total) by mouth daily. 270 tablet 1   HYDROcodone-acetaminophen (NORCO/VICODIN) 5-325 MG tablet Take 1-2 tablets by mouth every 6 (six) hours as needed for severe pain. 60 tablet 0   lenalidomide (REVLIMID) 15 MG capsule Take 1 capsule (15 mg total) by mouth daily. Take for 14 days on, 7 days off, repeat every 21 days. [employer] # [id number], 05/18/2019 14 capsule 0   loratadine (CLARITIN) 10 MG tablet Take 1 tablet (10 mg total) by mouth daily. 30 tablet 11   LORazepam (ATIVAN) 0.5 MG tablet Take 1 tablet (0.5 mg total) by mouth every 6 (six) hours as needed (Nausea or vomiting). 30 tablet 0   metoprolol succinate (TOPROL-XL) 25 MG 24 hr tablet Take 1 tablet (25 mg total) by mouth 2 (two) times daily. 60 tablet 6   ondansetron (ZOFRAN) 8 MG tablet Take 1 tablet (8 mg total) by mouth 2 (two) times daily as needed (Nausea or vomiting). 30 tablet 1   prochlorperazine (COMPAZINE) 10 MG tablet Take 1 tablet (10 mg total) by mouth every 6 (six) hours as needed (Nausea or vomiting). 30 tablet 1   senna-docusate (SENNA S) 8.6-50 MG tablet Take 2 tablets by mouth at bedtime. 60 tablet 2   spironolactone (ALDACTONE) 25 MG tablet Take 1 tablet (25 mg total) by mouth daily. 90 tablet 3   No current facility-administered medications for this visit.     REVIEW OF SYSTEMS:   A 10+  POINT REVIEW OF SYSTEMS WAS OBTAINED including neurology, dermatology, psychiatry, cardiac, respiratory, lymph, extremities, GI, GU, Musculoskeletal, constitutional, breasts, reproductive, HEENT.  All pertinent positives are noted in the HPI.  All others are negative.    PHYSICAL EXAMINATION: ECOG FS:1 - Symptomatic but completely ambulatory  Vitals:   07/03/19 1037  BP: 127/63  Pulse: 86  Resp: 18  Temp: 99.8 F (37.7 C)  SpO2: 100%   Wt Readings from Last 3 Encounters:  07/03/19 193 lb 11.2 oz (87.9 kg)  07/02/19 191 lb 9.6 oz (86.9 kg)  06/22/19 194 lb 1.6 oz (88 kg)   Body mass index is 27.02 kg/m.   Exam was given in a chair   GENERAL:alert, in no acute distress and comfortable SKIN: no acute rashes, no significant lesions EYES: conjunctiva are pink and non-injected, sclera anicteric OROPHARYNX: MMM, no exudates, no oropharyngeal erythema or ulceration NECK: supple, no JVD LYMPH:  no palpable lymphadenopathy in the cervical, axillary or inguinal regions LUNGS: clear to auscultation b/l with normal respiratory effort HEART: regular rate & rhythm ABDOMEN:  normoactive bowel sounds , non tender, not distended. No palpable hepatosplenomegaly.  Extremity: no pedal edema  PSYCH: alert & oriented x 3 with fluent speech NEURO: no focal motor/sensory deficits  LABORATORY DATA:  I have reviewed the data as listed  . CBC Latest Ref Rng & Units 07/03/2019 06/19/2019 06/12/2019  WBC 4.0 - 10.5 K/uL 21.8(H) 5.4 7.1  Hemoglobin 13.0 - 17.0  g/dL 12.0(L) 13.4 12.9(L)  Hematocrit 39.0 - 52.0 % 36.4(L) 40.7 39.2  Platelets 150 - 400 K/uL 286 103(L) 174    . CMP Latest Ref Rng & Units 07/03/2019 07/02/2019 06/19/2019  Glucose 70 - 99 mg/dL 110(H) 145(H) 146(H)  BUN 8 - 23 mg/dL 49(H) 23 32(H)  Creatinine 0.61 - 1.24 mg/dL 1.24 0.98 1.20  Sodium 135 - 145 mmol/L 141 137 138  Potassium 3.5 - 5.1 mmol/L 3.7 3.7 4.1  Chloride 98 - 111 mmol/L 103 104 104  CO2 22 - 32 mmol/L '25 23 24    '$ Calcium 8.9 - 10.3 mg/dL 9.2 8.9 8.7(L)  Total Protein 6.5 - 8.1 g/dL 7.1 - 6.8  Total Bilirubin 0.3 - 1.2 mg/dL 0.6 - 0.5  Alkaline Phos 38 - 126 U/L 92 - 85  AST 15 - 41 U/L 26 - 22  ALT 0 - 44 U/L 26 - 23   Component     Latest Ref Rng & Units 04/30/2019  Total Protein, Urine-UPE24     Not Estab. mg/dL   Total Protein, Urine-Ur/day     30 - 150 mg/24 hr   ALBUMIN, U     %   ALPHA 1 URINE     %   Alpha 2, Urine     %   % BETA, Urine     %   GAMMA GLOBULIN URINE     %   Free Kappa Lt Chains,Ur     0.63 - 113.79 mg/L   Free Lambda Lt Chains,Ur     0.47 - 11.77 mg/L   Free Kappa/Lambda Ratio     1.03 - 31.76   Immunofixation Result, Urine        Total Volume        M-SPIKE %, Urine     Not Observed %   M-Spike, mg/24 hr     Not Observed mg/24 hr   NOTE:        IgG (Immunoglobin G), Serum     603 - 1,613 mg/dL 1,740 (H)  IgA     61 - 437 mg/dL 16 (L)  IgM (Immunoglobulin M), Srm     15 - 143 mg/dL 9 (L)  Total Protein ELP     6.0 - 8.5 g/dL 7.3  Albumin SerPl Elph-Mcnc     2.9 - 4.4 g/dL 3.8  Alpha 1     0.0 - 0.4 g/dL 0.3  Alpha2 Glob SerPl Elph-Mcnc     0.4 - 1.0 g/dL 0.7  B-Globulin SerPl Elph-Mcnc     0.7 - 1.3 g/dL 0.9  Gamma Glob SerPl Elph-Mcnc     0.4 - 1.8 g/dL 1.6  M Protein SerPl Elph-Mcnc     Not Observed g/dL 1.5 (H)  Globulin, Total     2.2 - 3.9 g/dL 3.5  Albumin/Glob SerPl     0.7 - 1.7 1.1  IFE 1      Comment  Please Note (HCV):      Comment  Kappa free light chain     3.3 - 19.4 mg/L 821.2 (H)  Lamda free light chains     5.7 - 26.3 mg/L 1.9 (L)  Kappa, lamda light chain ratio     0.26 - 1.65 432.21 (H)  LDH     98 - 192 U/L 132  Sed Rate     0 - 16 mm/hr 3   05/24/2019 FISH Panel     05/24/2019 Bone Marrow Biopsy    04/16/2019 Surgical  Pathology:   RADIOGRAPHIC STUDIES: I have personally reviewed the radiological images as listed and agreed with the findings in the report. No results found.  ASSESSMENT &  PLAN:   78 yo   #1 Recently diagnosed Plasma cell myeloma  03/28/2019 MRI pelvis w/wo contrast revealed "1. Destructive bone lesions as detailed above. Findings most consistent with metastatic disease. PET-CT may be helpful for further evaluation and to establish a primary tumor. The right pelvic bone lesions should be amenable to image guided biopsy but a PET scan may demonstrate easier/safer biopsy sites. 2. No intrapelvic mass or adenopathy. 3. Benign intraosseous lipoma involving the left anterior superior acetabulum."  04/06/2019 PET whole body revealed "1. Diffuse osseous metastatic disease as detailed above without findings for a primary neoplasm in the chest, abdomen or pelvis. The large destructive lesion involving the right ischium should be amenable to image guided biopsy. 2. Two small retroperitoneal lymph nodes and 1 small right obturator node showing hypermetabolism."   04/16/2019 Posterior right pelvis bone biopsy revealed PLASMA CELL NEOPLASM  05/24/2019 Bone Marrow Biopsy revealed "BONE MARROW: - CELLULAR MARROW WITH INVOLVEMENT BY PLASMA CELL NEOPLASM (20%) PERIPHERAL BLOOD: - MORPHOLOGICALLY UNREMARKABLE"  05/24/2019 FISH Panel revealed "ABNORMAL result with 11q+, 14q+ and +17"  #2 Severe aortic stenosis with bicuspid aortic valve -10/26/2018 ECHO revealed AVA at 0.8 cm2 and LV EF of 60-65% -05/08/2019 pt had a Transfemoral Transcatheter Aortic Valve Replacement   PLAN: -Discussed pt labwork today, 07/03/19;  all values are WNL except for WBC at 21.8K, RBC at 3.94, Hgb at 12.0, HCT at 36.4, Neutro Abs at 18.6K, Mono Abs at 2.0K, Abs Immature Granulocytes at 0.23K, Glucose at 110, BUN at 49, GFR Est Non Af Am at 55. -Discussed 07/03/2019 K/L light chains -- normalized with normal K/L ratio. -Discussed 07/03/2019 MMP -- M spike down from 1.5 to 0.4 -Pt has no prohibitive toxicities from continuing C3D1 of Velcade at this time -Discussed Velcade dosage, will move to 1x per  week to prevent worsening neuropathy -Discontinue Gabapentin  -Advised pt to take Hydrocodone if pt has nerve pain caused by Cymbalta -Continue Loratadine and Famotidine   -Rx low-dose Cymbalta  -Rx Ergocalciferol 1x per week  -Refill Hydrocodone  -Will see back in 2 weeks with labs    FOLLOW UP: Velcade treatment plan changed from D1,4,8,1 to D1,8,15. Plz change as per orders for C3 and also plz schedule C4 Labs on D1,8,15 every cycle MD visit in 2 weeks on C3D15 for toxicity check Continue Zometa q4weeks  The total time spent in the appt was 30 minutes and more than 50% was on counseling and direct patient cares.  All of the patient's questions were answered with apparent satisfaction. The patient knows to call the clinic with any problems, questions or concerns.    Sullivan Lone MD [place] AAHIVMS [facility] [place] Hematology/Oncology Physician [facility]  (Office):       [phone] (Work cell):  [phone] (Fax):           [phone]  07/03/2019 11:26 AM  I, Yevette Edwards, am acting as a scribe for Dr. Sullivan Lone.   .I have reviewed the above documentation for accuracy and completeness, and I agree with the above. Brunetta Genera MD

## 2019-07-03 ENCOUNTER — Other Ambulatory Visit: Payer: Self-pay

## 2019-07-03 ENCOUNTER — Inpatient Hospital Stay: Payer: Medicare Other

## 2019-07-03 ENCOUNTER — Inpatient Hospital Stay (HOSPITAL_BASED_OUTPATIENT_CLINIC_OR_DEPARTMENT_OTHER): Payer: Medicare Other | Admitting: Hematology

## 2019-07-03 ENCOUNTER — Other Ambulatory Visit: Payer: Self-pay | Admitting: Hematology

## 2019-07-03 VITALS — BP 127/63 | HR 86 | Temp 99.8°F | Resp 18 | Ht 71.0 in | Wt 193.7 lb

## 2019-07-03 DIAGNOSIS — M25559 Pain in unspecified hip: Secondary | ICD-10-CM | POA: Diagnosis not present

## 2019-07-03 DIAGNOSIS — C9000 Multiple myeloma not having achieved remission: Secondary | ICD-10-CM

## 2019-07-03 DIAGNOSIS — C7951 Secondary malignant neoplasm of bone: Secondary | ICD-10-CM

## 2019-07-03 DIAGNOSIS — Z5111 Encounter for antineoplastic chemotherapy: Secondary | ICD-10-CM | POA: Diagnosis not present

## 2019-07-03 DIAGNOSIS — Z7189 Other specified counseling: Secondary | ICD-10-CM

## 2019-07-03 DIAGNOSIS — R202 Paresthesia of skin: Secondary | ICD-10-CM | POA: Diagnosis not present

## 2019-07-03 DIAGNOSIS — R0609 Other forms of dyspnea: Secondary | ICD-10-CM | POA: Diagnosis not present

## 2019-07-03 LAB — CBC WITH DIFFERENTIAL/PLATELET
Abs Immature Granulocytes: 0.23 10*3/uL — ABNORMAL HIGH (ref 0.00–0.07)
Basophils Absolute: 0 10*3/uL (ref 0.0–0.1)
Basophils Relative: 0 %
Eosinophils Absolute: 0 10*3/uL (ref 0.0–0.5)
Eosinophils Relative: 0 %
HCT: 36.4 % — ABNORMAL LOW (ref 39.0–52.0)
Hemoglobin: 12 g/dL — ABNORMAL LOW (ref 13.0–17.0)
Immature Granulocytes: 1 %
Lymphocytes Relative: 5 %
Lymphs Abs: 1 10*3/uL (ref 0.7–4.0)
MCH: 30.5 pg (ref 26.0–34.0)
MCHC: 33 g/dL (ref 30.0–36.0)
MCV: 92.4 fL (ref 80.0–100.0)
Monocytes Absolute: 2 10*3/uL — ABNORMAL HIGH (ref 0.1–1.0)
Monocytes Relative: 9 %
Neutro Abs: 18.6 10*3/uL — ABNORMAL HIGH (ref 1.7–7.7)
Neutrophils Relative %: 85 %
Platelets: 286 10*3/uL (ref 150–400)
RBC: 3.94 MIL/uL — ABNORMAL LOW (ref 4.22–5.81)
RDW: 13.5 % (ref 11.5–15.5)
WBC: 21.8 10*3/uL — ABNORMAL HIGH (ref 4.0–10.5)
nRBC: 0 % (ref 0.0–0.2)

## 2019-07-03 LAB — CMP (CANCER CENTER ONLY)
ALT: 26 U/L (ref 0–44)
AST: 26 U/L (ref 15–41)
Albumin: 4.4 g/dL (ref 3.5–5.0)
Alkaline Phosphatase: 92 U/L (ref 38–126)
Anion gap: 13 (ref 5–15)
BUN: 49 mg/dL — ABNORMAL HIGH (ref 8–23)
CO2: 25 mmol/L (ref 22–32)
Calcium: 9.2 mg/dL (ref 8.9–10.3)
Chloride: 103 mmol/L (ref 98–111)
Creatinine: 1.24 mg/dL (ref 0.61–1.24)
GFR, Est AFR Am: >60 mL/min (ref >60)
GFR, Estimated: 55 mL/min — ABNORMAL LOW (ref >60)
Glucose, Bld: 110 mg/dL — ABNORMAL HIGH (ref 70–99)
Potassium: 3.7 mmol/L (ref 3.5–5.1)
Sodium: 141 mmol/L (ref 135–145)
Total Bilirubin: 0.6 mg/dL (ref 0.3–1.2)
Total Protein: 7.1 g/dL (ref 6.5–8.1)

## 2019-07-03 MED ORDER — ZOLEDRONIC ACID 4 MG/100ML IV SOLN
4.0000 mg | Freq: Once | INTRAVENOUS | Status: AC
  Administered 2019-07-03: 4 mg via INTRAVENOUS
  Filled 2019-07-03: qty 100

## 2019-07-03 MED ORDER — BORTEZOMIB CHEMO SQ INJECTION 3.5 MG (2.5MG/ML)
1.3000 mg/m2 | Freq: Once | INTRAMUSCULAR | Status: AC
  Administered 2019-07-03: 2.75 mg via SUBCUTANEOUS
  Filled 2019-07-03: qty 1.1

## 2019-07-03 MED ORDER — ERGOCALCIFEROL 1.25 MG (50000 UT) PO CAPS: 50000.0000 [IU] | ORAL_CAPSULE | ORAL | 3 refills | Status: DC

## 2019-07-03 MED ORDER — SODIUM CHLORIDE 0.9 % IV SOLN
Freq: Once | INTRAVENOUS | Status: AC
  Administered 2019-07-03: 12:00:00 via INTRAVENOUS
  Filled 2019-07-03: qty 250

## 2019-07-03 MED ORDER — PROCHLORPERAZINE MALEATE 10 MG PO TABS
ORAL_TABLET | ORAL | Status: AC
  Filled 2019-07-03: qty 1

## 2019-07-03 MED ORDER — DULOXETINE HCL 20 MG PO CPEP: 20.0000 mg | ORAL_CAPSULE | Freq: Every day | ORAL | 1 refills | Status: DC

## 2019-07-03 MED ORDER — HYDROCODONE-ACETAMINOPHEN 5-325 MG PO TABS: 1.0000 | ORAL_TABLET | Freq: Four times a day (QID) | ORAL | 0 refills | Status: DC | PRN

## 2019-07-03 MED ORDER — PROCHLORPERAZINE MALEATE 10 MG PO TABS
10.0000 mg | ORAL_TABLET | Freq: Once | ORAL | Status: AC
  Administered 2019-07-03: 10 mg via ORAL

## 2019-07-03 NOTE — Patient Instructions (Signed)
[facility] Discharge Instructions for Patients Receiving Chemotherapy  Today you received the following chemotherapy agents Bortezomib (VELCADE).  To help prevent nausea and vomiting after your treatment, we encourage you to take your nausea medication as prescribed.   If you develop nausea and vomiting that is not controlled by your nausea medication, call the clinic.   BELOW ARE SYMPTOMS THAT SHOULD BE REPORTED IMMEDIATELY:  *FEVER GREATER THAN 100.5 F  *CHILLS WITH OR WITHOUT FEVER  NAUSEA AND VOMITING THAT IS NOT CONTROLLED WITH YOUR NAUSEA MEDICATION  *UNUSUAL SHORTNESS OF BREATH  *UNUSUAL BRUISING OR BLEEDING  TENDERNESS IN MOUTH AND THROAT WITH OR WITHOUT PRESENCE OF ULCERS  *URINARY PROBLEMS  *BOWEL PROBLEMS  UNUSUAL RASH Items with * indicate a potential emergency and should be followed up as soon as possible.  Feel free to call the clinic should you have any questions or concerns. The clinic phone number is [phone].  Please show the [facility] at check-in to the Emergency Department and triage nurse.  Zoledronic Acid injection (Hypercalcemia, Oncology) What is this medicine? ZOLEDRONIC ACID (ZOE le dron ik AS id) lowers the amount of calcium loss from bone. It is used to treat too much calcium in your blood from cancer. It is also used to prevent complications of cancer that has spread to the bone. This medicine may be used for other purposes; ask your health care provider or pharmacist if you have questions. COMMON BRAND NAME(S): Zometa What should I tell my health care provider before I take this medicine? They need to know if you have any of these conditions:  aspirin-sensitive asthma  cancer, especially if you are receiving medicines used to treat cancer  dental disease or wear dentures  infection  kidney disease  receiving corticosteroids like dexamethasone or prednisone  an unusual or allergic reaction to zoledronic  acid, other medicines, foods, dyes, or preservatives  pregnant or trying to get pregnant  breast-feeding How should I use this medicine? This medicine is for infusion into a vein. It is given by a health care professional in a hospital or clinic setting. Talk to your pediatrician regarding the use of this medicine in children. Special care may be needed. Overdosage: If you think you have taken too much of this medicine contact a poison control center or emergency room at once. NOTE: This medicine is only for you. Do not share this medicine with others. What if I miss a dose? It is important not to miss your dose. Call your doctor or health care professional if you are unable to keep an appointment. What may interact with this medicine?  certain antibiotics given by injection  NSAIDs, medicines for pain and inflammation, like ibuprofen or naproxen  some diuretics like bumetanide, furosemide  teriparatide  thalidomide This list may not describe all possible interactions. Give your health care provider a list of all the medicines, herbs, non-prescription drugs, or dietary supplements you use. Also tell them if you smoke, drink alcohol, or use illegal drugs. Some items may interact with your medicine. What should I watch for while using this medicine? Visit your doctor or health care professional for regular checkups. It may be some time before you see the benefit from this medicine. Do not stop taking your medicine unless your doctor tells you to. Your doctor may order blood tests or other tests to see how you are doing. Women should inform their doctor if they wish to become pregnant or think they might be  pregnant. There is a potential for serious side effects to an unborn child. Talk to your health care professional or pharmacist for more information. You should make sure that you get enough calcium and vitamin D while you are taking this medicine. Discuss the foods you eat and the  vitamins you take with your health care professional. Some people who take this medicine have severe bone, joint, and/or muscle pain. This medicine may also increase your risk for jaw problems or a broken thigh bone. Tell your doctor right away if you have severe pain in your jaw, bones, joints, or muscles. Tell your doctor if you have any pain that does not go away or that gets worse. Tell your dentist and dental surgeon that you are taking this medicine. You should not have major dental surgery while on this medicine. See your dentist to have a dental exam and fix any dental problems before starting this medicine. Take good care of your teeth while on this medicine. Make sure you see your dentist for regular follow-up appointments. What side effects may I notice from receiving this medicine? Side effects that you should report to your doctor or health care professional as soon as possible:  allergic reactions like skin rash, itching or hives, swelling of the face, lips, or tongue  anxiety, confusion, or depression  breathing problems  changes in vision  eye pain  feeling faint or lightheaded, falls  jaw pain, especially after dental work  mouth sores  muscle cramps, stiffness, or weakness  redness, blistering, peeling or loosening of the skin, including inside the mouth  trouble passing urine or change in the amount of urine Side effects that usually do not require medical attention (report to your doctor or health care professional if they continue or are bothersome):  bone, joint, or muscle pain  constipation  diarrhea  fever  hair loss  irritation at site where injected  loss of appetite  nausea, vomiting  stomach upset  trouble sleeping  trouble swallowing  weak or tired This list may not describe all possible side effects. Call your doctor for medical advice about side effects. You may report side effects to FDA at 1-800-FDA-1088. Where should I keep my  medicine? This drug is given in a hospital or clinic and will not be stored at home. NOTE: This sheet is a summary. It may not cover all possible information. If you have questions about this medicine, talk to your doctor, pharmacist, or health care provider.  2020 Elsevier/Gold Standard (2014-01-26 14:19:39)  Coronavirus (COVID-19) Are you at risk?  Are you at risk for the Coronavirus (COVID-19)?  To be considered HIGH RISK for Coronavirus (COVID-19), you have to meet the following criteria:  . Traveled to [place], [place], [place], [place] or [place]; or in the [place] to [place], [place], [place], or [place]; and have fever, cough, and shortness of breath within the last 2 weeks of travel OR . Been in close contact with a person diagnosed with COVID-19 within the last 2 weeks and have fever, cough, and shortness of breath . IF YOU DO NOT MEET THESE CRITERIA, YOU ARE CONSIDERED LOW RISK FOR COVID-19.  What to do if you are HIGH RISK for COVID-19?  . If you are having a medical emergency, call 911. . Seek medical care right away. Before you go to a doctor's office, urgent care or emergency department, call ahead and tell them about your recent travel, contact with someone diagnosed with COVID-19,   and your symptoms. You should receive instructions from your physician's office regarding next steps of care.  . When you arrive at healthcare provider, tell the healthcare staff immediately you have returned from visiting [place], [place], [place], [place] or [place]; or traveled in the [place] to [place], [place], [place], or [place]; in the last two weeks or you have been in close contact with a person diagnosed with COVID-19 in the last 2 weeks.   . Tell the health care staff about your symptoms: fever, cough and shortness of breath. . After you have been seen by a medical provider, you will be either: o Tested for (COVID-19) and discharged home on quarantine except to  seek medical care if symptoms worsen, and asked to  - Stay home and avoid contact with others until you get your results (4-5 days)  - Avoid travel on public transportation if possible (such as bus, train, or airplane) or o Sent to the Emergency Department by EMS for evaluation, COVID-19 testing, and possible admission depending on your condition and test results.  What to do if you are LOW RISK for COVID-19?  Reduce your risk of any infection by using the same precautions used for avoiding the common cold or flu:  . Wash your hands often with soap and warm water for at least 20 seconds.  If soap and water are not readily available, use an alcohol-based hand sanitizer with at least 60% alcohol.  . If coughing or sneezing, cover your mouth and nose by coughing or sneezing into the elbow areas of your shirt or coat, into a tissue or into your sleeve (not your hands). . Avoid shaking hands with others and consider head nods or verbal greetings only. . Avoid touching your eyes, nose, or mouth with unwashed hands.  . Avoid close contact with people who are sick. . Avoid places or events with large numbers of people in one location, like concerts or sporting events. . Carefully consider travel plans you have or are making. . If you are planning any travel outside or inside the [place], visit the CDC's Travelers' Health webpage for the latest health notices. . If you have some symptoms but not all symptoms, continue to monitor at home and seek medical attention if your symptoms worsen. . If you are having a medical emergency, call 911.   ADDITIONAL HEALTHCARE OPTIONS FOR PATIENTS  [facility] / e-Visit: [email or web address]         [facility]: [phone]  [facility]: [phone]                   [facility]: [phone]   

## 2019-07-04 ENCOUNTER — Other Ambulatory Visit: Payer: Self-pay | Admitting: Hematology

## 2019-07-04 ENCOUNTER — Telehealth: Payer: Self-pay | Admitting: Hematology

## 2019-07-04 LAB — KAPPA/LAMBDA LIGHT CHAINS
Kappa free light chain: 10.4 mg/L (ref 3.3–19.4)
Kappa, lambda light chain ratio: 1.35 (ref 0.26–1.65)
Lambda free light chains: 7.7 mg/L (ref 5.7–26.3)

## 2019-07-04 NOTE — Telephone Encounter (Signed)
Scheduled appt per 10/20 los.  Melissa D spoke with MD and confirmed that patient will be getting treatment weekly, with no breaks in between.  Spoke with pt and he is aware of his appt date and time.

## 2019-07-05 ENCOUNTER — Other Ambulatory Visit: Payer: Self-pay | Admitting: *Deleted

## 2019-07-05 DIAGNOSIS — C9000 Multiple myeloma not having achieved remission: Secondary | ICD-10-CM

## 2019-07-05 DIAGNOSIS — Z7189 Other specified counseling: Secondary | ICD-10-CM

## 2019-07-05 LAB — MULTIPLE MYELOMA PANEL, SERUM
Albumin SerPl Elph-Mcnc: 4 g/dL (ref 2.9–4.4)
Albumin/Glob SerPl: 1.7 (ref 0.7–1.7)
Alpha 1: 0.3 g/dL (ref 0.0–0.4)
Alpha2 Glob SerPl Elph-Mcnc: 0.6 g/dL (ref 0.4–1.0)
B-Globulin SerPl Elph-Mcnc: 0.9 g/dL (ref 0.7–1.3)
Gamma Glob SerPl Elph-Mcnc: 0.6 g/dL (ref 0.4–1.8)
Globulin, Total: 2.4 g/dL (ref 2.2–3.9)
IgA: 38 mg/dL — ABNORMAL LOW (ref 61–437)
IgG (Immunoglobin G), Serum: 753 mg/dL (ref 603–1613)
IgM (Immunoglobulin M), Srm: 31 mg/dL (ref 15–143)
M Protein SerPl Elph-Mcnc: 0.4 g/dL — ABNORMAL HIGH
Total Protein ELP: 6.4 g/dL (ref 6.0–8.5)

## 2019-07-05 MED ORDER — LENALIDOMIDE 15 MG PO CAPS: 15.0000 mg | ORAL_CAPSULE | Freq: Every day | ORAL | 0 refills | Status: DC

## 2019-07-05 NOTE — Telephone Encounter (Signed)
Refilled Revlimid 15 mg per Dr.Kale verbal order Refill escribed to [facility] by [facility] Gambles, [place] - [address] [facility] Auth# [id number], 07/05/2019

## 2019-07-06 ENCOUNTER — Inpatient Hospital Stay: Payer: Medicare Other

## 2019-07-08 ENCOUNTER — Other Ambulatory Visit (HOSPITAL_COMMUNITY): Payer: Self-pay | Admitting: Cardiology

## 2019-07-10 ENCOUNTER — Inpatient Hospital Stay: Payer: Medicare Other

## 2019-07-10 ENCOUNTER — Other Ambulatory Visit: Payer: Self-pay

## 2019-07-10 VITALS — BP 124/66 | HR 64 | Temp 98.2°F | Resp 20

## 2019-07-10 DIAGNOSIS — C7951 Secondary malignant neoplasm of bone: Secondary | ICD-10-CM | POA: Diagnosis not present

## 2019-07-10 DIAGNOSIS — Z7189 Other specified counseling: Secondary | ICD-10-CM

## 2019-07-10 DIAGNOSIS — C9000 Multiple myeloma not having achieved remission: Secondary | ICD-10-CM

## 2019-07-10 DIAGNOSIS — R202 Paresthesia of skin: Secondary | ICD-10-CM | POA: Diagnosis not present

## 2019-07-10 DIAGNOSIS — M25559 Pain in unspecified hip: Secondary | ICD-10-CM | POA: Diagnosis not present

## 2019-07-10 DIAGNOSIS — R0609 Other forms of dyspnea: Secondary | ICD-10-CM | POA: Diagnosis not present

## 2019-07-10 DIAGNOSIS — Z5111 Encounter for antineoplastic chemotherapy: Secondary | ICD-10-CM | POA: Diagnosis not present

## 2019-07-10 LAB — CBC WITH DIFFERENTIAL/PLATELET
Abs Immature Granulocytes: 0.27 10*3/uL — ABNORMAL HIGH (ref 0.00–0.07)
Basophils Absolute: 0 10*3/uL (ref 0.0–0.1)
Basophils Relative: 0 %
Eosinophils Absolute: 0.1 10*3/uL (ref 0.0–0.5)
Eosinophils Relative: 1 %
HCT: 33 % — ABNORMAL LOW (ref 39.0–52.0)
Hemoglobin: 10.8 g/dL — ABNORMAL LOW (ref 13.0–17.0)
Immature Granulocytes: 2 %
Lymphocytes Relative: 10 %
Lymphs Abs: 1.3 10*3/uL (ref 0.7–4.0)
MCH: 29.9 pg (ref 26.0–34.0)
MCHC: 32.7 g/dL (ref 30.0–36.0)
MCV: 91.4 fL (ref 80.0–100.0)
Monocytes Absolute: 1 10*3/uL (ref 0.1–1.0)
Monocytes Relative: 8 %
Neutro Abs: 10.3 10*3/uL — ABNORMAL HIGH (ref 1.7–7.7)
Neutrophils Relative %: 79 %
Platelets: 164 10*3/uL (ref 150–400)
RBC: 3.61 MIL/uL — ABNORMAL LOW (ref 4.22–5.81)
RDW: 13.7 % (ref 11.5–15.5)
WBC: 13 10*3/uL — ABNORMAL HIGH (ref 4.0–10.5)
nRBC: 0 % (ref 0.0–0.2)

## 2019-07-10 LAB — CMP (CANCER CENTER ONLY)
ALT: 38 U/L (ref 0–44)
AST: 36 U/L (ref 15–41)
Albumin: 4 g/dL (ref 3.5–5.0)
Alkaline Phosphatase: 71 U/L (ref 38–126)
Anion gap: 11 (ref 5–15)
BUN: 20 mg/dL (ref 8–23)
CO2: 24 mmol/L (ref 22–32)
Calcium: 8.6 mg/dL — ABNORMAL LOW (ref 8.9–10.3)
Chloride: 105 mmol/L (ref 98–111)
Creatinine: 1.14 mg/dL (ref 0.61–1.24)
GFR, Est AFR Am: >60 mL/min (ref >60)
GFR, Estimated: >60 mL/min (ref >60)
Glucose, Bld: 123 mg/dL — ABNORMAL HIGH (ref 70–99)
Potassium: 3.6 mmol/L (ref 3.5–5.1)
Sodium: 140 mmol/L (ref 135–145)
Total Bilirubin: 0.6 mg/dL (ref 0.3–1.2)
Total Protein: 6.5 g/dL (ref 6.5–8.1)

## 2019-07-10 MED ORDER — PROCHLORPERAZINE MALEATE 10 MG PO TABS
ORAL_TABLET | ORAL | Status: AC
  Filled 2019-07-10: qty 1

## 2019-07-10 MED ORDER — PROCHLORPERAZINE MALEATE 10 MG PO TABS
10.0000 mg | ORAL_TABLET | Freq: Once | ORAL | Status: AC
  Administered 2019-07-10: 10 mg via ORAL

## 2019-07-10 MED ORDER — BORTEZOMIB CHEMO SQ INJECTION 3.5 MG (2.5MG/ML)
1.3000 mg/m2 | Freq: Once | INTRAMUSCULAR | Status: AC
  Administered 2019-07-10: 2.75 mg via SUBCUTANEOUS
  Filled 2019-07-10: qty 1.1

## 2019-07-10 NOTE — Patient Instructions (Signed)
[facility] Discharge Instructions for Patients Receiving Chemotherapy  Today you received the following chemotherapy agents Bortezomib (VELCADE).  To help prevent nausea and vomiting after your treatment, we encourage you to take your nausea medication as prescribed.   If you develop nausea and vomiting that is not controlled by your nausea medication, call the clinic.   BELOW ARE SYMPTOMS THAT SHOULD BE REPORTED IMMEDIATELY:  *FEVER GREATER THAN 100.5 F  *CHILLS WITH OR WITHOUT FEVER  NAUSEA AND VOMITING THAT IS NOT CONTROLLED WITH YOUR NAUSEA MEDICATION  *UNUSUAL SHORTNESS OF BREATH  *UNUSUAL BRUISING OR BLEEDING  TENDERNESS IN MOUTH AND THROAT WITH OR WITHOUT PRESENCE OF ULCERS  *URINARY PROBLEMS  *BOWEL PROBLEMS  UNUSUAL RASH Items with * indicate a potential emergency and should be followed up as soon as possible.  Feel free to call the clinic should you have any questions or concerns. The clinic phone number is [phone].  Please show the [facility] at check-in to the Emergency Department and triage nurse.  Zoledronic Acid injection (Hypercalcemia, Oncology) What is this medicine? ZOLEDRONIC ACID (ZOE le dron ik AS id) lowers the amount of calcium loss from bone. It is used to treat too much calcium in your blood from cancer. It is also used to prevent complications of cancer that has spread to the bone. This medicine may be used for other purposes; ask your health care provider or pharmacist if you have questions. COMMON BRAND NAME(S): Zometa What should I tell my health care provider before I take this medicine? They need to know if you have any of these conditions:  aspirin-sensitive asthma  cancer, especially if you are receiving medicines used to treat cancer  dental disease or wear dentures  infection  kidney disease  receiving corticosteroids like dexamethasone or prednisone  an unusual or allergic reaction to zoledronic  acid, other medicines, foods, dyes, or preservatives  pregnant or trying to get pregnant  breast-feeding How should I use this medicine? This medicine is for infusion into a vein. It is given by a health care professional in a hospital or clinic setting. Talk to your pediatrician regarding the use of this medicine in children. Special care may be needed. Overdosage: If you think you have taken too much of this medicine contact a poison control center or emergency room at once. NOTE: This medicine is only for you. Do not share this medicine with others. What if I miss a dose? It is important not to miss your dose. Call your doctor or health care professional if you are unable to keep an appointment. What may interact with this medicine?  certain antibiotics given by injection  NSAIDs, medicines for pain and inflammation, like ibuprofen or naproxen  some diuretics like bumetanide, furosemide  teriparatide  thalidomide This list may not describe all possible interactions. Give your health care provider a list of all the medicines, herbs, non-prescription drugs, or dietary supplements you use. Also tell them if you smoke, drink alcohol, or use illegal drugs. Some items may interact with your medicine. What should I watch for while using this medicine? Visit your doctor or health care professional for regular checkups. It may be some time before you see the benefit from this medicine. Do not stop taking your medicine unless your doctor tells you to. Your doctor may order blood tests or other tests to see how you are doing. Women should inform their doctor if they wish to become pregnant or think they might be  pregnant. There is a potential for serious side effects to an unborn child. Talk to your health care professional or pharmacist for more information. You should make sure that you get enough calcium and vitamin D while you are taking this medicine. Discuss the foods you eat and the  vitamins you take with your health care professional. Some people who take this medicine have severe bone, joint, and/or muscle pain. This medicine may also increase your risk for jaw problems or a broken thigh bone. Tell your doctor right away if you have severe pain in your jaw, bones, joints, or muscles. Tell your doctor if you have any pain that does not go away or that gets worse. Tell your dentist and dental surgeon that you are taking this medicine. You should not have major dental surgery while on this medicine. See your dentist to have a dental exam and fix any dental problems before starting this medicine. Take good care of your teeth while on this medicine. Make sure you see your dentist for regular follow-up appointments. What side effects may I notice from receiving this medicine? Side effects that you should report to your doctor or health care professional as soon as possible:  allergic reactions like skin rash, itching or hives, swelling of the face, lips, or tongue  anxiety, confusion, or depression  breathing problems  changes in vision  eye pain  feeling faint or lightheaded, falls  jaw pain, especially after dental work  mouth sores  muscle cramps, stiffness, or weakness  redness, blistering, peeling or loosening of the skin, including inside the mouth  trouble passing urine or change in the amount of urine Side effects that usually do not require medical attention (report to your doctor or health care professional if they continue or are bothersome):  bone, joint, or muscle pain  constipation  diarrhea  fever  hair loss  irritation at site where injected  loss of appetite  nausea, vomiting  stomach upset  trouble sleeping  trouble swallowing  weak or tired This list may not describe all possible side effects. Call your doctor for medical advice about side effects. You may report side effects to FDA at 1-800-FDA-1088. Where should I keep my  medicine? This drug is given in a hospital or clinic and will not be stored at home. NOTE: This sheet is a summary. It may not cover all possible information. If you have questions about this medicine, talk to your doctor, pharmacist, or health care provider.  2020 Elsevier/Gold Standard (2014-01-26 14:19:39)  Coronavirus (COVID-19) Are you at risk?  Are you at risk for the Coronavirus (COVID-19)?  To be considered HIGH RISK for Coronavirus (COVID-19), you have to meet the following criteria:  . Traveled to [place], [place], [place], [place] or [place]; or in the [place] to [place], [place], [place], or [place]; and have fever, cough, and shortness of breath within the last 2 weeks of travel OR . Been in close contact with a person diagnosed with COVID-19 within the last 2 weeks and have fever, cough, and shortness of breath . IF YOU DO NOT MEET THESE CRITERIA, YOU ARE CONSIDERED LOW RISK FOR COVID-19.  What to do if you are HIGH RISK for COVID-19?  . If you are having a medical emergency, call 911. . Seek medical care right away. Before you go to a doctor's office, urgent care or emergency department, call ahead and tell them about your recent travel, contact with someone diagnosed with COVID-19,   and your symptoms. You should receive instructions from your physician's office regarding next steps of care.  . When you arrive at healthcare provider, tell the healthcare staff immediately you have returned from visiting [place], [place], [place], [place] or [place]; or traveled in the [place] to [place], [place], [place], or [place]; in the last two weeks or you have been in close contact with a person diagnosed with COVID-19 in the last 2 weeks.   . Tell the health care staff about your symptoms: fever, cough and shortness of breath. . After you have been seen by a medical provider, you will be either: o Tested for (COVID-19) and discharged home on quarantine except to  seek medical care if symptoms worsen, and asked to  - Stay home and avoid contact with others until you get your results (4-5 days)  - Avoid travel on public transportation if possible (such as bus, train, or airplane) or o Sent to the Emergency Department by EMS for evaluation, COVID-19 testing, and possible admission depending on your condition and test results.  What to do if you are LOW RISK for COVID-19?  Reduce your risk of any infection by using the same precautions used for avoiding the common cold or flu:  . Wash your hands often with soap and warm water for at least 20 seconds.  If soap and water are not readily available, use an alcohol-based hand sanitizer with at least 60% alcohol.  . If coughing or sneezing, cover your mouth and nose by coughing or sneezing into the elbow areas of your shirt or coat, into a tissue or into your sleeve (not your hands). . Avoid shaking hands with others and consider head nods or verbal greetings only. . Avoid touching your eyes, nose, or mouth with unwashed hands.  . Avoid close contact with people who are sick. . Avoid places or events with large numbers of people in one location, like concerts or sporting events. . Carefully consider travel plans you have or are making. . If you are planning any travel outside or inside the [place], visit the CDC's Travelers' Health webpage for the latest health notices. . If you have some symptoms but not all symptoms, continue to monitor at home and seek medical attention if your symptoms worsen. . If you are having a medical emergency, call 911.   ADDITIONAL HEALTHCARE OPTIONS FOR PATIENTS  [facility] / e-Visit: [email or web address]         [facility]: [phone]  [facility]: [phone]                   [facility]: [phone]   

## 2019-07-11 LAB — KAPPA/LAMBDA LIGHT CHAINS
Kappa free light chain: 10.7 mg/L (ref 3.3–19.4)
Kappa, lambda light chain ratio: 1.23 (ref 0.26–1.65)
Lambda free light chains: 8.7 mg/L (ref 5.7–26.3)

## 2019-07-13 ENCOUNTER — Ambulatory Visit: Payer: Medicare Other

## 2019-07-13 ENCOUNTER — Other Ambulatory Visit: Payer: Medicare Other

## 2019-07-13 ENCOUNTER — Ambulatory Visit: Payer: Medicare Other | Admitting: Hematology

## 2019-07-17 ENCOUNTER — Other Ambulatory Visit: Payer: Self-pay

## 2019-07-17 ENCOUNTER — Inpatient Hospital Stay: Payer: Medicare Other | Attending: Hematology

## 2019-07-17 ENCOUNTER — Inpatient Hospital Stay (HOSPITAL_BASED_OUTPATIENT_CLINIC_OR_DEPARTMENT_OTHER): Payer: Medicare Other | Admitting: Hematology

## 2019-07-17 ENCOUNTER — Inpatient Hospital Stay: Payer: Medicare Other

## 2019-07-17 ENCOUNTER — Telehealth: Payer: Self-pay | Admitting: *Deleted

## 2019-07-17 VITALS — BP 136/65 | HR 68 | Temp 98.2°F | Resp 18 | Ht 71.0 in | Wt 180.6 lb

## 2019-07-17 VITALS — BP 135/70 | HR 60 | Temp 98.6°F | Resp 20

## 2019-07-17 DIAGNOSIS — G629 Polyneuropathy, unspecified: Secondary | ICD-10-CM | POA: Insufficient documentation

## 2019-07-17 DIAGNOSIS — R634 Abnormal weight loss: Secondary | ICD-10-CM | POA: Diagnosis not present

## 2019-07-17 DIAGNOSIS — M1990 Unspecified osteoarthritis, unspecified site: Secondary | ICD-10-CM | POA: Diagnosis not present

## 2019-07-17 DIAGNOSIS — C7951 Secondary malignant neoplasm of bone: Secondary | ICD-10-CM | POA: Diagnosis not present

## 2019-07-17 DIAGNOSIS — E7800 Pure hypercholesterolemia, unspecified: Secondary | ICD-10-CM | POA: Diagnosis not present

## 2019-07-17 DIAGNOSIS — M25559 Pain in unspecified hip: Secondary | ICD-10-CM | POA: Diagnosis not present

## 2019-07-17 DIAGNOSIS — Z79899 Other long term (current) drug therapy: Secondary | ICD-10-CM | POA: Diagnosis not present

## 2019-07-17 DIAGNOSIS — I110 Hypertensive heart disease with heart failure: Secondary | ICD-10-CM | POA: Insufficient documentation

## 2019-07-17 DIAGNOSIS — K760 Fatty (change of) liver, not elsewhere classified: Secondary | ICD-10-CM | POA: Diagnosis not present

## 2019-07-17 DIAGNOSIS — K5900 Constipation, unspecified: Secondary | ICD-10-CM | POA: Diagnosis not present

## 2019-07-17 DIAGNOSIS — C9000 Multiple myeloma not having achieved remission: Secondary | ICD-10-CM

## 2019-07-17 DIAGNOSIS — I350 Nonrheumatic aortic (valve) stenosis: Secondary | ICD-10-CM | POA: Insufficient documentation

## 2019-07-17 DIAGNOSIS — Z7901 Long term (current) use of anticoagulants: Secondary | ICD-10-CM | POA: Insufficient documentation

## 2019-07-17 DIAGNOSIS — I5032 Chronic diastolic (congestive) heart failure: Secondary | ICD-10-CM | POA: Diagnosis not present

## 2019-07-17 DIAGNOSIS — Z9181 History of falling: Secondary | ICD-10-CM | POA: Diagnosis not present

## 2019-07-17 DIAGNOSIS — R0602 Shortness of breath: Secondary | ICD-10-CM | POA: Insufficient documentation

## 2019-07-17 DIAGNOSIS — Z800 Family history of malignant neoplasm of digestive organs: Secondary | ICD-10-CM | POA: Diagnosis not present

## 2019-07-17 DIAGNOSIS — R42 Dizziness and giddiness: Secondary | ICD-10-CM | POA: Diagnosis not present

## 2019-07-17 DIAGNOSIS — Z7982 Long term (current) use of aspirin: Secondary | ICD-10-CM | POA: Insufficient documentation

## 2019-07-17 DIAGNOSIS — Z7189 Other specified counseling: Secondary | ICD-10-CM

## 2019-07-17 DIAGNOSIS — I480 Paroxysmal atrial fibrillation: Secondary | ICD-10-CM | POA: Insufficient documentation

## 2019-07-17 DIAGNOSIS — R630 Anorexia: Secondary | ICD-10-CM | POA: Insufficient documentation

## 2019-07-17 LAB — CMP (CANCER CENTER ONLY)
ALT: 31 U/L (ref 0–44)
AST: 38 U/L (ref 15–41)
Albumin: 4.1 g/dL (ref 3.5–5.0)
Alkaline Phosphatase: 70 U/L (ref 38–126)
Anion gap: 15 (ref 5–15)
BUN: 20 mg/dL (ref 8–23)
CO2: 26 mmol/L (ref 22–32)
Calcium: 8.9 mg/dL (ref 8.9–10.3)
Chloride: 102 mmol/L (ref 98–111)
Creatinine: 1.24 mg/dL (ref 0.61–1.24)
GFR, Est AFR Am: >60 mL/min (ref >60)
GFR, Estimated: 55 mL/min — ABNORMAL LOW (ref >60)
Glucose, Bld: 122 mg/dL — ABNORMAL HIGH (ref 70–99)
Potassium: 2.6 mmol/L — CL (ref 3.5–5.1)
Sodium: 143 mmol/L (ref 135–145)
Total Bilirubin: 0.9 mg/dL (ref 0.3–1.2)
Total Protein: 6.4 g/dL — ABNORMAL LOW (ref 6.5–8.1)

## 2019-07-17 LAB — CBC WITH DIFFERENTIAL/PLATELET
Abs Immature Granulocytes: 0.06 10*3/uL (ref 0.00–0.07)
Basophils Absolute: 0 10*3/uL (ref 0.0–0.1)
Basophils Relative: 0 %
Eosinophils Absolute: 0.4 10*3/uL (ref 0.0–0.5)
Eosinophils Relative: 5 %
HCT: 33.8 % — ABNORMAL LOW (ref 39.0–52.0)
Hemoglobin: 11.5 g/dL — ABNORMAL LOW (ref 13.0–17.0)
Immature Granulocytes: 1 %
Lymphocytes Relative: 14 %
Lymphs Abs: 1.3 10*3/uL (ref 0.7–4.0)
MCH: 30.5 pg (ref 26.0–34.0)
MCHC: 34 g/dL (ref 30.0–36.0)
MCV: 89.7 fL (ref 80.0–100.0)
Monocytes Absolute: 1.2 10*3/uL — ABNORMAL HIGH (ref 0.1–1.0)
Monocytes Relative: 13 %
Neutro Abs: 6.5 10*3/uL (ref 1.7–7.7)
Neutrophils Relative %: 67 %
Platelets: 130 10*3/uL — ABNORMAL LOW (ref 150–400)
RBC: 3.77 MIL/uL — ABNORMAL LOW (ref 4.22–5.81)
RDW: 14.1 % (ref 11.5–15.5)
WBC: 9.5 10*3/uL (ref 4.0–10.5)
nRBC: 0 % (ref 0.0–0.2)

## 2019-07-17 MED ORDER — SODIUM CHLORIDE 0.9 % IV SOLN
INTRAVENOUS | Status: DC
  Administered 2019-07-17: 13:00:00 via INTRAVENOUS
  Filled 2019-07-17: qty 250

## 2019-07-17 MED ORDER — POTASSIUM CHLORIDE CRYS ER 20 MEQ PO TBCR
40.0000 meq | EXTENDED_RELEASE_TABLET | Freq: Once | ORAL | Status: AC
  Administered 2019-07-17: 13:00:00 40 meq via ORAL

## 2019-07-17 MED ORDER — POTASSIUM CHLORIDE CRYS ER 20 MEQ PO TBCR
EXTENDED_RELEASE_TABLET | ORAL | Status: AC
  Filled 2019-07-17: qty 2

## 2019-07-17 MED ORDER — POTASSIUM CHLORIDE 10 MEQ/100ML IV SOLN
10.0000 meq | INTRAVENOUS | Status: AC
  Administered 2019-07-17 (×2): 10 meq via INTRAVENOUS
  Filled 2019-07-17: qty 100

## 2019-07-17 MED ORDER — POTASSIUM CHLORIDE IN NACL 20-0.9 MEQ/L-% IV SOLN: INTRAVENOUS | Status: DC

## 2019-07-17 NOTE — Progress Notes (Signed)
HEMATOLOGY/ONCOLOGY CLINIC NOTE  Date of Service: 07/17/2019  Patient Care Team: Cassandria Anger, MD as PCP - General Erline Levine, MD as Attending Physician (Neurosurgery) Larey Dresser, MD (Cardiology) Jarome Matin, MD as Consulting Physician (Dermatology) Rigoberto Noel, MD as Consulting Physician (Pulmonary Disease)   CHIEF COMPLAINTS/PURPOSE OF CONSULTATION:  Recently diagnosed Plasma cell myeloma   HISTORY OF PRESENTING ILLNESS:  [name] [name] is a wonderful 78 y.o. male who has been referred to [place] by Angelena Form, PA for evaluation and management of lytic bone lesions. The pt reports that he is doing well overall.  The pt reports that he has occasional hip pain that radiates down his legs and prevents him from walking. He uses Advil, which helps his hip pain. He is used to exercising often, but has not been able to stay very physically active lately so he is gaining weight. The pt experiences some SOB when he wakes up in the morning.  The pt had a pre-procedural CTA C/A/P before a TAVR completed on 03/14/2019 which revealed "indeterminate osseus lesions in the bony pelvis," which led to an MRI and PET scan. He reports that he has pain when he pushes on his right chest.   He also notes that he had a fall while exercising in 06/2018 and thought he broke his back. He received a blood transfusion on 07/24/2018.   Of note prior to the patient's visit today, the pt has had a MRI pelvis w/wo contrast completed on 03/28/2019 with results revealing "1. Destructive bone lesions as detailed above. Findings most consistent with metastatic disease. PET-CT may be helpful for further evaluation and to establish a primary tumor. The right pelvic bone lesions should be amenable to image guided biopsy but a PET scan may demonstrate easier/safer biopsy sites. 2. No intrapelvic mass or adenopathy. 3. Benign intraosseous lipoma involving the left anterior superior acetabulum."  The pt  has also had PET whole body completed on 04/06/2019 with results revealing "1. Diffuse osseous metastatic disease as detailed above without findings for a primary neoplasm in the chest, abdomen or pelvis. The large destructive lesion involving the right ischium should be amenable to image guided biopsy. 2. Two small retroperitoneal lymph nodes and 1 small right obturator node showing hypermetabolism."   Most recent lab results (04/06/2019) of CBC is as follows: all values are WNL.  On review of systems, pt reports hip and leg pain, weight gain and denies syncope and denies belly pain, recent neuropathy and any other symptoms.   On PMHx the pt reports 5 cm hepatic flexure polyp removal, pneumonia, blood transfusion on 07/24/2018  On Social Hx the pt reports that he lives at home with his wife. He is from [place].    INTERVAL HISTORY:  [name] [name] is a wonderful 78 y.o. male who has who is here today for evaluation and management of newly diagnosed Plasma Cell Myeloma. Pt is here for C3D15 of Velcade. The patient's last visit with [place] was on 07/03/2019. The pt reports that he is doing well overall.  The pt reports that he has been unable to sleep for the last 10 days due to the pain in his lower legs. The neuropathy in his lower legs goes between pain and numbness. Hydrocodone is not controlling his pain. He describes the pain as radiating from the bone and cramping. Pt's fingertips are also becoming numb. He and his wife have been massaging the numb/painful areas which helps for some time but the  pain comes back. Pt's symptoms worsened in the last week, since his last Velcade shot. Pt has also had a lack of appetite lately and has been losing weight. He has continued to take Cymbalta and one Hydrocodone at a time. He has not had any issues taking Revlimid including rashes, nausea, vomiting or diarrhea. He does report occasional constipation that is not too bothersome. Pt has continued to take  Furosemide daily. He has tried to walk and use a stationary bike, which helped initially but caused additional pain later. He has continued to use his Potassium supplement.   Lab results today (07/17/19) of CBC w/diff and CMP is as follows: all values are WNL except for RBC at 3.77, Hgb at 11.5, HCT at 33.8, PLTs at 130K. Monocytes Abs at 1.2K, Potassium at 2.6, Glucose at 122, Total Protein at 6.4, GFR Est Non Af Am at 55. 07/17/2019 K/L light chains are in progress   On review of systems, pt reports pain in his lower legs, numbness/tingling in lower legs/fingertips, constipation and denies N/V/D, rashes, improving leg swelling and any other symptoms.   MEDICAL HISTORY:  Past Medical History:  Diagnosis Date   Acute blood loss anemia    Adenomatous colon polyp    Ascending aortic aneurysm (HCC)    Bicuspid aortic valve    Chest pain    ETT-myoview 12/11 w/exercise, no chest pain, no significant ST changes, EF 69%, no evidence for ischemia or infarction.   CHF NYHA class I (no symptoms from ordinary activities), acute, diastolic (HCC)    Fatty liver    mild   GI bleed    GI bleeding 07/21/2018   post polypectomy   Hemorrhoids    HTN (hypertension)    Hypercholesteremia    Hypokalemia    Internal hemorrhoids    LBP (low back pain)    Moderate aortic stenosis    Osteoarthritis    Paroxysmal atrial fibrillation ([place])    a. new onset Afib in 07/2008. He underwent ibutilide cardioversion successfully. b. Recurrence 01/2013 s/p TEE/DCCV - was on Xarelto but he stopped it as he was convinced it was causing joint pn. c. Recurrence 01/2016 - spont conv to NSR. Pt took Eliquis x1 mo then declined further anticoag. d. Recurrence 07/2016.   Pneumonia    Tubular adenoma of colon     SURGICAL HISTORY: Past Surgical History:  Procedure Laterality Date   BACK SURGERY  x12 years ago   CARDIOVERSION N/A 01/26/2013   Procedure: CARDIOVERSION;  Surgeon: Larey Dresser, MD;   Location: [facility];  Service: Cardiovascular;  Laterality: N/A;   CARDIOVERSION N/A 10/28/2017   Procedure: CARDIOVERSION;  Surgeon: Larey Dresser, MD;  Location: [facility] ENDOSCOPY;  Service: Cardiovascular;  Laterality: N/A;   CARDIOVERSION N/A 03/03/2018   Procedure: CARDIOVERSION;  Surgeon: Lelon Perla, MD;  Location: [facility] ENDOSCOPY;  Service: Cardiovascular;  Laterality: N/A;   COLONOSCOPY     COLONOSCOPY  07/17/2018   at [place]     RIGHT/LEFT HEART CATH AND CORONARY ANGIOGRAPHY N/A 03/07/2019   Procedure: RIGHT/LEFT HEART CATH AND CORONARY ANGIOGRAPHY;  Surgeon: Burnell Blanks, MD;  Location: [facility] [employer] CV LAB;  Service: Cardiovascular;  Laterality: N/A;   TEE WITHOUT CARDIOVERSION N/A 01/26/2013   Procedure: TRANSESOPHAGEAL ECHOCARDIOGRAM (TEE);  Surgeon: Larey Dresser, MD;  Location: [facility];  Service: Cardiovascular;  Laterality: N/A;   TEE WITHOUT CARDIOVERSION N/A 10/28/2017  Procedure: TRANSESOPHAGEAL ECHOCARDIOGRAM (TEE);  Surgeon: Larey Dresser, MD;  Location: [facility] ENDOSCOPY;  Service: Cardiovascular;  Laterality: N/A;   TEE WITHOUT CARDIOVERSION N/A 05/08/2019   Procedure: TRANSESOPHAGEAL ECHOCARDIOGRAM (TEE);  Surgeon: Burnell Blanks, MD;  Location: [place] CV LAB;  Service: Open Heart Surgery;  Laterality: N/A;   TRANSCATHETER AORTIC VALVE REPLACEMENT, TRANSFEMORAL N/A 05/08/2019   Procedure: TRANSCATHETER AORTIC VALVE REPLACEMENT, TRANSFEMORAL;  Surgeon: Burnell Blanks, MD;  Location: [place] CV LAB;  Service: Open Heart Surgery;  Laterality: N/A;    SOCIAL HISTORY: Social History   Socioeconomic History   Marital status: Married    Spouse name: Not on file   Number of children: 0   Years of education: Not on file   Highest education level: Not on file  Occupational History   Occupation: Retired Lobbyist: [employer] resource strain: Not on file   Food insecurity    Worry: Not on file    Inability: Not on file   Transportation needs    Medical: Not on file    Non-medical: Not on file  Tobacco Use   Smoking status: Never Smoker   Smokeless tobacco: Never Used  Substance and Sexual Activity   Alcohol use: Yes    Comment: Drinks 1 glass of wine nightly/socially   Drug use: No   Sexual activity: Yes  Lifestyle   Physical activity    Days per week: Not on file    Minutes per session: Not on file   Stress: Not on file  Relationships   Social connections    Talks on phone: Not on file    Gets together: Not on file    Attends religious service: Not on file    Active member of club or organization: Not on file    Attends meetings of clubs or organizations: Not on file    Relationship status: Not on file   Intimate partner violence    Fear of current or ex partner: Not on file    Emotionally abused: Not on file    Physically abused: Not on file    Forced sexual activity: Not on file  Other Topics Concern   Not on file  Social History Narrative   Patient lives in [place] w/ his wife. He is a native of [place]. 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   Right-handed   Caffeine: 2 cups coffee per day    FAMILY HISTORY: Family History  Problem Relation Age of Onset   Colon cancer Mother 50   Hypertension Other    Coronary artery disease Neg Hx    Colon polyps Neg Hx    Esophageal cancer Neg Hx    Rectal cancer Neg Hx    Stomach cancer Neg Hx     ALLERGIES:  is allergic to xarelto [rivaroxaban]; corticosteroids; ramipril; and benazepril.  MEDICATIONS:  Current Outpatient Medications  Medication Sig Dispense Refill   acyclovir (ZOVIRAX) 400 MG tablet Take 1 tablet (400 mg total) by mouth 2 (two) times daily. 60 tablet 3   amLODipine (NORVASC) 5 MG tablet Take 1 tablet (5 mg total) by mouth daily. 90 tablet 3    aspirin 81 MG chewable tablet Chew 1 tablet (81 mg total) by mouth daily.     dexamethasone (DECADRON) 4 MG tablet Take 3 tablets (12 mg total) by mouth once a week. 20 tablet 6  DULoxetine (CYMBALTA) 20 MG capsule Take 1 capsule (20 mg total) by mouth daily. 30 capsule 1   ELIQUIS 5 MG TABS tablet TAKE 1 TABLET BY MOUTH TWICE A DAY 60 tablet 6   ergocalciferol (VITAMIN D2) 1.25 MG (50000 UT) capsule Take 1 capsule (50,000 Units total) by mouth once a week. 12 capsule 3   famotidine (PEPCID) 40 MG tablet Take 1 tablet (40 mg total) by mouth daily. 30 tablet 11   furosemide (LASIX) 20 MG tablet Take 3 tablets (60 mg total) by mouth daily. 270 tablet 1   HYDROcodone-acetaminophen (NORCO/VICODIN) 5-325 MG tablet Take 1-2 tablets by mouth every 6 (six) hours as needed for severe pain. 60 tablet 0   lenalidomide (REVLIMID) 15 MG capsule Take 1 capsule (15 mg total) by mouth daily. Take for 14 days on, 7 days off, repeat every 21 days. 14 capsule 0   loratadine (CLARITIN) 10 MG tablet Take 1 tablet (10 mg total) by mouth daily. 30 tablet 11   LORazepam (ATIVAN) 0.5 MG tablet Take 1 tablet (0.5 mg total) by mouth every 6 (six) hours as needed (Nausea or vomiting). 30 tablet 0   metoprolol succinate (TOPROL-XL) 25 MG 24 hr tablet Take 1 tablet (25 mg total) by mouth 2 (two) times daily. 60 tablet 6   ondansetron (ZOFRAN) 8 MG tablet Take 1 tablet (8 mg total) by mouth 2 (two) times daily as needed (Nausea or vomiting). 30 tablet 1   prochlorperazine (COMPAZINE) 10 MG tablet Take 1 tablet (10 mg total) by mouth every 6 (six) hours as needed (Nausea or vomiting). 30 tablet 1   senna-docusate (SENNA S) 8.6-50 MG tablet Take 2 tablets by mouth at bedtime. 60 tablet 2   spironolactone (ALDACTONE) 25 MG tablet Take 1 tablet (25 mg total) by mouth daily. 90 tablet 3   No current facility-administered medications for this visit.    Facility-Administered Medications Ordered in Other Visits    Medication Dose Route Frequency Provider Last Rate Last Dose   0.9 %  sodium chloride infusion   Intravenous Continuous Brunetta Genera, MD   Stopped at 07/17/19 1538    REVIEW OF SYSTEMS:   A 10+ POINT REVIEW OF SYSTEMS WAS OBTAINED including neurology, dermatology, psychiatry, cardiac, respiratory, lymph, extremities, GI, GU, Musculoskeletal, constitutional, breasts, reproductive, HEENT.  All pertinent positives are noted in the HPI.  All others are negative.   PHYSICAL EXAMINATION: ECOG FS:1 - Symptomatic but completely ambulatory  Vitals:   07/17/19 1139  BP: 136/65  Pulse: 68  Resp: 18  Temp: 98.2 F (36.8 C)  SpO2: 98%   Wt Readings from Last 3 Encounters:  07/17/19 180 lb 9.6 oz (81.9 kg)  07/03/19 193 lb 11.2 oz (87.9 kg)  07/02/19 191 lb 9.6 oz (86.9 kg)   Body mass index is 25.19 kg/m.    GENERAL:alert, in no acute distress and comfortable SKIN: no acute rashes, no significant lesions EYES: conjunctiva are pink and non-injected, sclera anicteric OROPHARYNX: MMM, no exudates, no oropharyngeal erythema or ulceration NECK: supple, no JVD LYMPH:  no palpable lymphadenopathy in the cervical, axillary or inguinal regions LUNGS: clear to auscultation b/l with normal respiratory effort HEART: regular rate & rhythm ABDOMEN:  normoactive bowel sounds , non tender, not distended. No palpable hepatosplenomegaly.  Extremity: no pedal edema PSYCH: alert & oriented x 3 with fluent speech NEURO: no focal motor/sensory deficits  LABORATORY DATA:  I have reviewed the data as listed  . CBC Latest Ref Rng &  Units 07/17/2019 07/10/2019 07/03/2019  WBC 4.0 - 10.5 K/uL 9.5 13.0(H) 21.8(H)  Hemoglobin 13.0 - 17.0 g/dL 11.5(L) 10.8(L) 12.0(L)  Hematocrit 39.0 - 52.0 % 33.8(L) 33.0(L) 36.4(L)  Platelets 150 - 400 K/uL 130(L) 164 286    . CMP Latest Ref Rng & Units 07/17/2019 07/10/2019 07/03/2019  Glucose 70 - 99 mg/dL 122(H) 123(H) 110(H)  BUN 8 - 23 mg/dL 20 20 49(H)   Creatinine 0.61 - 1.24 mg/dL 1.24 1.14 1.24  Sodium 135 - 145 mmol/L 143 140 141  Potassium 3.5 - 5.1 mmol/L 2.6(LL) 3.6 3.7  Chloride 98 - 111 mmol/L 102 105 103  CO2 22 - 32 mmol/L _0 Calcium 8.9 - 10.3 mg/dL 8.9 8.6(L) 9.2  Total Protein 6.5 - 8.1 g/dL 6.4(L) 6.5 7.1  Total Bilirubin 0.3 - 1.2 mg/dL 0.9 0.6 0.6  Alkaline Phos 38 - 126 U/L 70 71 92  AST 15 - 41 U/L 38 36 26  ALT 0 - 44 U/L 31 38 26   Component     Latest Ref Rng & Units 04/30/2019  Total Protein, Urine-UPE24     Not Estab. mg/dL   Total Protein, Urine-Ur/day     30 - 150 mg/24 hr   ALBUMIN, U     %   ALPHA 1 URINE     %   Alpha 2, Urine     %   % BETA, Urine     %   GAMMA GLOBULIN URINE     %   Free Kappa Lt Chains,Ur     0.63 - 113.79 mg/L   Free Lambda Lt Chains,Ur     0.47 - 11.77 mg/L   Free Kappa/Lambda Ratio     1.03 - 31.76   Immunofixation Result, Urine        Total Volume        M-SPIKE %, Urine     Not Observed %   M-Spike, mg/24 hr     Not Observed mg/24 hr   NOTE:        IgG (Immunoglobin G), Serum     603 - 1,613 mg/dL 1,740 (H)  IgA     61 - 437 mg/dL 16 (L)  IgM (Immunoglobulin M), Srm     15 - 143 mg/dL 9 (L)  Total Protein ELP     6.0 - 8.5 g/dL 7.3  Albumin SerPl Elph-Mcnc     2.9 - 4.4 g/dL 3.8  Alpha 1     0.0 - 0.4 g/dL 0.3  Alpha2 Glob SerPl Elph-Mcnc     0.4 - 1.0 g/dL 0.7  B-Globulin SerPl Elph-Mcnc     0.7 - 1.3 g/dL 0.9  Gamma Glob SerPl Elph-Mcnc     0.4 - 1.8 g/dL 1.6  M Protein SerPl Elph-Mcnc     Not Observed g/dL 1.5 (H)  Globulin, Total     2.2 - 3.9 g/dL 3.5  Albumin/Glob SerPl     0.7 - 1.7 1.1  IFE 1      Comment  Please Note (HCV):      Comment  Kappa free light chain     3.3 - 19.4 mg/L 821.2 (H)  Lamda free light chains     5.7 - 26.3 mg/L 1.9 (L)  Kappa, lamda light chain ratio     0.26 - 1.65 432.21 (H)  LDH     98 - 192 U/L 132  Sed Rate     0 - 16 mm/hr 3  05/24/2019 FISH Panel     05/24/2019 Bone Marrow Biopsy     04/16/2019 Surgical Pathology:   RADIOGRAPHIC STUDIES: I have personally reviewed the radiological images as listed and agreed with the findings in the report. No results found.  ASSESSMENT & PLAN:   78 yo   #1 Recently diagnosed Plasma cell myeloma  03/28/2019 MRI pelvis w/wo contrast revealed "1. Destructive bone lesions as detailed above. Findings most consistent with metastatic disease. PET-CT may be helpful for further evaluation and to establish a primary tumor. The right pelvic bone lesions should be amenable to image guided biopsy but a PET scan may demonstrate easier/safer biopsy sites. 2. No intrapelvic mass or adenopathy. 3. Benign intraosseous lipoma involving the left anterior superior acetabulum."  04/06/2019 PET whole body revealed "1. Diffuse osseous metastatic disease as detailed above without findings for a primary neoplasm in the chest, abdomen or pelvis. The large destructive lesion involving the right ischium should be amenable to image guided biopsy. 2. Two small retroperitoneal lymph nodes and 1 small right obturator node showing hypermetabolism."   04/16/2019 Posterior right pelvis bone biopsy revealed PLASMA CELL NEOPLASM  05/24/2019 Bone Marrow Biopsy revealed "BONE MARROW: - CELLULAR MARROW WITH INVOLVEMENT BY PLASMA CELL NEOPLASM (20%) PERIPHERAL BLOOD: - MORPHOLOGICALLY UNREMARKABLE"  05/24/2019 FISH Panel revealed "ABNORMAL result with 11q+, 14q+ and +17"  #2 Severe aortic stenosis with bicuspid aortic valve -10/26/2018 ECHO revealed AVA at 0.8 cm2 and LV EF of 60-65% -05/08/2019 pt had a Transfemoral Transcatheter Aortic Valve Replacement   PLAN: -Discussed pt labwork today, 07/17/19; all values are WNL except for RBC at 3.77, Hgb at 11.5, HCT at 33.8, PLTs at 130K. Monocytes Abs at 1.2K, Potassium at 2.6, Glucose at 122, Total Protein at 6.4, GFR Est Non Af Am at 55. -Discussed 07/17/2019 K/L light chains are in progress  -10/27 K/L light chains  have normalized (normal K/L ratio) -10/20 MMP shows all values are WNL except for IgA at 38, M Protein 0.4 -M protein coming down; 0.4 g/dL from 1.5 g/dL -Recommended that the pt continue to eat well, drink at least 48-64 oz of water each day, and walk 20-30 minutes each day.  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  All of the patient's questions were answered with apparent satisfaction. The patient knows to call the clinic with any problems, questions or concerns.   Sullivan Lone MD [place] AAHIVMS [facility] Hematology/Oncology Physician [facility]  (Office):       [phone] (Work cell):  [phone] (Fax):           [phone]  07/17/2019 4:46 PM  I, Yevette Edwards, am acting as a scribe for Dr. Sullivan Lone.   .I have reviewed the above documentation for accuracy and completeness, and I agree with the above. Brunetta Genera MD

## 2019-07-17 NOTE — Patient Instructions (Signed)
Hypokalemia Hypokalemia means that the amount of potassium in the blood is lower than normal. Potassium is a chemical (electrolyte) that helps regulate the amount of fluid in the body. It also stimulates muscle tightening (contraction) and helps nerves work properly. Normally, most of the body's potassium is inside cells, and only a very small amount is in the blood. Because the amount in the blood is so small, minor changes to potassium levels in the blood can be life-threatening. What are the causes? This condition may be caused by:  Antibiotic medicine.  Diarrhea or vomiting. Taking too much of a medicine that helps you have a bowel movement (laxative) can cause diarrhea and lead to hypokalemia.  Chronic kidney disease (CKD).  Medicines that help the body get rid of excess fluid (diuretics).  Eating disorders, such as bulimia.  Low magnesium levels in the body.  Sweating a lot. What are the signs or symptoms? Symptoms of this condition include:  Weakness.  Constipation.  Fatigue.  Muscle cramps.  Mental confusion.  Skipped heartbeats or irregular heartbeat (palpitations).  Tingling or numbness. How is this diagnosed? This condition is diagnosed with a blood test. How is this treated? This condition may be treated by:  Taking potassium supplements by mouth.  Adjusting the medicines that you take.  Eating more foods that contain a lot of potassium. If your potassium level is very low, you may need to get potassium through an IV and be monitored in the hospital. Follow these instructions at home:   Take over-the-counter and prescription medicines only as told by your health care provider. This includes vitamins and supplements.  Eat a healthy diet. A healthy diet includes fresh fruits and vegetables, whole grains, healthy fats, and lean proteins.  If instructed, eat more foods that contain a lot of potassium. This includes: ? Nuts, such as peanuts and pistachios.  ? Seeds, such as sunflower seeds and pumpkin seeds. ? Peas, lentils, and lima beans. ? Whole grain and bran cereals and breads. ? Fresh fruits and vegetables, such as apricots, avocado, bananas, cantaloupe, kiwi, oranges, tomatoes, asparagus, and potatoes. ? Orange juice. ? Tomato juice. ? Red meats. ? Yogurt.  Keep all follow-up visits as told by your health care provider. This is important. Contact a health care provider if you:  Have weakness that gets worse.  Feel your heart pounding or racing.  Vomit.  Have diarrhea.  Have diabetes (diabetes mellitus) and you have trouble keeping your blood sugar (glucose) in your target range. Get help right away if you:  Have chest pain.  Have shortness of breath.  Have vomiting or diarrhea that lasts for more than 2 days.  Faint. Summary  Hypokalemia means that the amount of potassium in the blood is lower than normal.  This condition is diagnosed with a blood test.  Hypokalemia may be treated by taking potassium supplements, adjusting the medicines that you take, or eating more foods that are high in potassium.  If your potassium level is very low, you may need to get potassium through an IV and be monitored in the hospital. This information is not intended to replace advice given to you by your health care provider. Make sure you discuss any questions you have with your health care provider. Document Released: 08/30/2005 Document Revised: 04/12/2018 Document Reviewed: 04/12/2018 Elsevier Patient Education  2020 [employer]  Coronavirus (COVID-19) Are you at risk?  Are you at risk for the Coronavirus (COVID-19)?  To be considered HIGH RISK for Coronavirus (COVID-19),   you have to meet the following criteria:  . Traveled to [place], [place], [place], [place] or [place]; or in the [place] to [place], [place], [place], or [place]; and have fever, cough, and shortness of breath within the last 2 weeks of travel OR  . Been in close contact with a person diagnosed with COVID-19 within the last 2 weeks and have fever, cough, and shortness of breath . IF YOU DO NOT MEET THESE CRITERIA, YOU ARE CONSIDERED LOW RISK FOR COVID-19.  What to do if you are HIGH RISK for COVID-19?  . If you are having a medical emergency, call 911. . Seek medical care right away. Before you go to a doctor's office, urgent care or emergency department, call ahead and tell them about your recent travel, contact with someone diagnosed with COVID-19, and your symptoms. You should receive instructions from your physician's office regarding next steps of care.  . When you arrive at healthcare provider, tell the healthcare staff immediately you have returned from visiting [place], [place], [place], [place] or [place]; or traveled in the [place] to [place], [place], [place], or [place]; in the last two weeks or you have been in close contact with a person diagnosed with COVID-19 in the last 2 weeks.   . Tell the health care staff about your symptoms: fever, cough and shortness of breath. . After you have been seen by a medical provider, you will be either: o Tested for (COVID-19) and discharged home on quarantine except to seek medical care if symptoms worsen, and asked to  - Stay home and avoid contact with others until you get your results (4-5 days)  - Avoid travel on public transportation if possible (such as bus, train, or airplane) or o Sent to the Emergency Department by EMS for evaluation, COVID-19 testing, and possible admission depending on your condition and test results.  What to do if you are LOW RISK for COVID-19?  Reduce your risk of any infection by using the same precautions used for avoiding the common cold or flu:  . Wash your hands often with soap and warm water for at least 20 seconds.  If soap and water are not readily available, use an alcohol-based hand sanitizer with at least 60% alcohol.  . If coughing or  sneezing, cover your mouth and nose by coughing or sneezing into the elbow areas of your shirt or coat, into a tissue or into your sleeve (not your hands). . Avoid shaking hands with others and consider head nods or verbal greetings only. . Avoid touching your eyes, nose, or mouth with unwashed hands.  . Avoid close contact with people who are sick. . Avoid places or events with large numbers of people in one location, like concerts or sporting events. . Carefully consider travel plans you have or are making. . If you are planning any travel outside or inside the [place], visit the CDC's Travelers' Health webpage for the latest health notices. . If you have some symptoms but not all symptoms, continue to monitor at home and seek medical attention if your symptoms worsen. . If you are having a medical emergency, call 911.   ADDITIONAL HEALTHCARE OPTIONS FOR PATIENTS  [facility] / e-Visit: [email or web address]         [facility]: [phone]  [facility]: [phone]                   [facility]: [phone]   

## 2019-07-17 NOTE — Telephone Encounter (Signed)
CRITICAL VALUE STICKER  CRITICAL VALUE: K+ = 2.3.  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   DATE & TIME NOTIFIED: 07/17/2019 at 1205.   MESSENGER (representative from lab): Rosann Auerbach [facility].  MD NOTIFIED: Dr. Irene Limbo.  TIME OF NOTIFICATION: 07/17/2019 at 1230.  RESPONSE: None.

## 2019-07-18 LAB — KAPPA/LAMBDA LIGHT CHAINS
Kappa free light chain: 13.6 mg/L (ref 3.3–19.4)
Kappa, lambda light chain ratio: 1.18 (ref 0.26–1.65)
Lambda free light chains: 11.5 mg/L (ref 5.7–26.3)

## 2019-07-19 MED ORDER — DULOXETINE HCL 20 MG PO CPEP: 40.0000 mg | ORAL_CAPSULE | Freq: Every day | ORAL | 1 refills | Status: DC

## 2019-07-19 MED ORDER — OXYCODONE HCL 5 MG PO TABS: 5.0000 mg | ORAL_TABLET | ORAL | 0 refills | Status: DC | PRN

## 2019-07-19 MED ORDER — POTASSIUM CHLORIDE CRYS ER 20 MEQ PO TBCR: EXTENDED_RELEASE_TABLET | ORAL | 1 refills | Status: DC

## 2019-07-24 ENCOUNTER — Inpatient Hospital Stay: Payer: Medicare Other

## 2019-07-24 ENCOUNTER — Other Ambulatory Visit: Payer: Self-pay

## 2019-07-24 ENCOUNTER — Inpatient Hospital Stay (HOSPITAL_BASED_OUTPATIENT_CLINIC_OR_DEPARTMENT_OTHER): Payer: Medicare Other | Admitting: Hematology

## 2019-07-24 VITALS — BP 133/65 | HR 65 | Temp 98.2°F | Resp 18 | Ht 71.0 in | Wt 190.3 lb

## 2019-07-24 DIAGNOSIS — C9000 Multiple myeloma not having achieved remission: Secondary | ICD-10-CM | POA: Diagnosis not present

## 2019-07-24 DIAGNOSIS — R634 Abnormal weight loss: Secondary | ICD-10-CM | POA: Diagnosis not present

## 2019-07-24 DIAGNOSIS — G629 Polyneuropathy, unspecified: Secondary | ICD-10-CM | POA: Diagnosis not present

## 2019-07-24 DIAGNOSIS — R630 Anorexia: Secondary | ICD-10-CM | POA: Diagnosis not present

## 2019-07-24 DIAGNOSIS — C7951 Secondary malignant neoplasm of bone: Secondary | ICD-10-CM

## 2019-07-24 DIAGNOSIS — R42 Dizziness and giddiness: Secondary | ICD-10-CM | POA: Diagnosis not present

## 2019-07-24 DIAGNOSIS — R0602 Shortness of breath: Secondary | ICD-10-CM | POA: Diagnosis not present

## 2019-07-24 LAB — CMP (CANCER CENTER ONLY)
ALT: 19 U/L (ref 0–44)
AST: 20 U/L (ref 15–41)
Albumin: 4.1 g/dL (ref 3.5–5.0)
Alkaline Phosphatase: 72 U/L (ref 38–126)
Anion gap: 10 (ref 5–15)
BUN: 23 mg/dL (ref 8–23)
CO2: 22 mmol/L (ref 22–32)
Calcium: 8.5 mg/dL — ABNORMAL LOW (ref 8.9–10.3)
Chloride: 113 mmol/L — ABNORMAL HIGH (ref 98–111)
Creatinine: 0.92 mg/dL (ref 0.61–1.24)
GFR, Est AFR Am: >60 mL/min (ref >60)
GFR, Estimated: >60 mL/min (ref >60)
Glucose, Bld: 114 mg/dL — ABNORMAL HIGH (ref 70–99)
Potassium: 3.9 mmol/L (ref 3.5–5.1)
Sodium: 145 mmol/L (ref 135–145)
Total Bilirubin: 0.5 mg/dL (ref 0.3–1.2)
Total Protein: 6.1 g/dL — ABNORMAL LOW (ref 6.5–8.1)

## 2019-07-24 LAB — CBC WITH DIFFERENTIAL/PLATELET
Abs Immature Granulocytes: 0.04 10*3/uL (ref 0.00–0.07)
Basophils Absolute: 0.1 10*3/uL (ref 0.0–0.1)
Basophils Relative: 2 %
Eosinophils Absolute: 0.6 10*3/uL — ABNORMAL HIGH (ref 0.0–0.5)
Eosinophils Relative: 9 %
HCT: 33 % — ABNORMAL LOW (ref 39.0–52.0)
Hemoglobin: 10.5 g/dL — ABNORMAL LOW (ref 13.0–17.0)
Immature Granulocytes: 1 %
Lymphocytes Relative: 17 %
Lymphs Abs: 1.1 10*3/uL (ref 0.7–4.0)
MCH: 30.1 pg (ref 26.0–34.0)
MCHC: 31.8 g/dL (ref 30.0–36.0)
MCV: 94.6 fL (ref 80.0–100.0)
Monocytes Absolute: 0.8 10*3/uL (ref 0.1–1.0)
Monocytes Relative: 12 %
Neutro Abs: 4 10*3/uL (ref 1.7–7.7)
Neutrophils Relative %: 59 %
Platelets: 184 10*3/uL (ref 150–400)
RBC: 3.49 MIL/uL — ABNORMAL LOW (ref 4.22–5.81)
RDW: 14.7 % (ref 11.5–15.5)
WBC: 6.6 10*3/uL (ref 4.0–10.5)
nRBC: 0 % (ref 0.0–0.2)

## 2019-07-24 MED ORDER — FENTANYL 12 MCG/HR TD PT72: 1.0000 | MEDICATED_PATCH | TRANSDERMAL | 0 refills | Status: DC

## 2019-07-24 NOTE — Progress Notes (Signed)
HEMATOLOGY/ONCOLOGY CLINIC NOTE  Date of Service: 07/24/2019  Patient Care Team: Cassandria Anger, MD as PCP - General Erline Levine, MD as Attending Physician (Neurosurgery) Larey Dresser, MD (Cardiology) Jarome Matin, MD as Consulting Physician (Dermatology) Rigoberto Noel, MD as Consulting Physician (Pulmonary Disease)   CHIEF COMPLAINTS/PURPOSE OF CONSULTATION:  Recently diagnosed Plasma cell myeloma   HISTORY OF PRESENTING ILLNESS:  [name] [name] is a wonderful 78 y.o. male who has been referred to [place] by Angelena Form, PA for evaluation and management of lytic bone lesions. The pt reports that he is doing well overall.  The pt reports that he has occasional hip pain that radiates down his legs and prevents him from walking. He uses Advil, which helps his hip pain. He is used to exercising often, but has not been able to stay very physically active lately so he is gaining weight. The pt experiences some SOB when he wakes up in the morning.  The pt had a pre-procedural CTA C/A/P before a TAVR completed on 03/14/2019 which revealed "indeterminate osseus lesions in the bony pelvis," which led to an MRI and PET scan. He reports that he has pain when he pushes on his right chest.   He also notes that he had a fall while exercising in 06/2018 and thought he broke his back. He received a blood transfusion on 07/24/2018.   Of note prior to the patient's visit today, the pt has had a MRI pelvis w/wo contrast completed on 03/28/2019 with results revealing "1. Destructive bone lesions as detailed above. Findings most consistent with metastatic disease. PET-CT may be helpful for further evaluation and to establish a primary tumor. The right pelvic bone lesions should be amenable to image guided biopsy but a PET scan may demonstrate easier/safer biopsy sites. 2. No intrapelvic mass or adenopathy. 3. Benign intraosseous lipoma involving the left anterior superior acetabulum."  The pt  has also had PET whole body completed on 04/06/2019 with results revealing "1. Diffuse osseous metastatic disease as detailed above without findings for a primary neoplasm in the chest, abdomen or pelvis. The large destructive lesion involving the right ischium should be amenable to image guided biopsy. 2. Two small retroperitoneal lymph nodes and 1 small right obturator node showing hypermetabolism."   Most recent lab results (04/06/2019) of CBC is as follows: all values are WNL.  On review of systems, pt reports hip and leg pain, weight gain and denies syncope and denies belly pain, recent neuropathy and any other symptoms.   On PMHx the pt reports 5 cm hepatic flexure polyp removal, pneumonia, blood transfusion on 07/24/2018  On Social Hx the pt reports that he lives at home with his wife. He is from [place].    INTERVAL HISTORY:  [name] [name] is a wonderful 78 y.o. male who has who is here today for evaluation and management of newly diagnosed Plasma Cell Myeloma. The patient's last visit with [place] was on 07/17/2019. The pt reports that he is doing well overall.  The pt reports that he is still experiencing a fair amount of pain in his legs. Oxycodone is helping his pain by numbing his leg but he notes that it makes his mouth dry. He has been taking 3-4 Oxycodone per day. Pt feels that his pain may be worse than it was at our last visit. His pain is still worse at night as he is laying down. He does have a little more appetite and is eating better as his  wife has been cooking great meals for him. He also reports that he has not experienced cramping since receiving the potassium infusion. Pt has been taking 60 mg of Cymbalta in the interim. Pt does have constipation sometimes but it is not bothersome. Pt uses Senna during the day. He went walking with his wife the other day but had to stop walking due to feeling dizzy. He has also stopped using his stationary bike because of his leg pain.   Lab  results today (07/24/19) of CBC w/diff and CMP is as follows: all values are WNL except for RBC at 3.49, Hgb at 10.5, HCT at 33.0, Eos Abs at 0.6K, Chloride at 113, Glucose at 114, Calcium at 8.5, Total Protein at 6.1. 07/24/2019 MMP is in progress  07/24/2019 K/L light chains is in progress   On review of systems, pt reports itching, leg pain, leg swelling, dizziness and denies skin rashes, back pain, leg cramping, abdominal pain and any other symptoms.    MEDICAL HISTORY:  Past Medical History:  Diagnosis Date  . Acute blood loss anemia   . Adenomatous colon polyp   . Ascending aortic aneurysm ([place])   . Bicuspid aortic valve   . Chest pain    ETT-myoview 12/11 w/exercise, no chest pain, no significant ST changes, EF 69%, no evidence for ischemia or infarction.  . CHF NYHA class I (no symptoms from ordinary activities), acute, diastolic ([place])   . Fatty liver    mild  . GI bleed   . GI bleeding 07/21/2018   post polypectomy  . Hemorrhoids   . HTN (hypertension)   . Hypercholesteremia   . Hypokalemia   . Internal hemorrhoids   . LBP (low back pain)   . Moderate aortic stenosis   . Osteoarthritis   . Paroxysmal atrial fibrillation ([place])    a. new onset Afib in 07/2008. He underwent ibutilide cardioversion successfully. b. Recurrence 01/2013 s/p TEE/DCCV - was on Xarelto but he stopped it as he was convinced it was causing joint pn. c. Recurrence 01/2016 - spont conv to NSR. Pt took Eliquis x1 mo then declined further anticoag. d. Recurrence 07/2016.  [name] Pneumonia   . Tubular adenoma of colon     SURGICAL HISTORY: Past Surgical History:  Procedure Laterality Date  . BACK SURGERY  x12 years ago  . CARDIOVERSION N/A 01/26/2013   Procedure: CARDIOVERSION;  Surgeon: Larey Dresser, MD;  Location: [facility] ENDOSCOPY;  Service: Cardiovascular;  Laterality: N/A;  . CARDIOVERSION N/A 10/28/2017   Procedure: CARDIOVERSION;  Surgeon: Larey Dresser, MD;  Location: [facility] ENDOSCOPY;  Service:  Cardiovascular;  Laterality: N/A;  . CARDIOVERSION N/A 03/03/2018   Procedure: CARDIOVERSION;  Surgeon: Lelon Perla, MD;  Location: [facility] ENDOSCOPY;  Service: Cardiovascular;  Laterality: N/A;  . COLONOSCOPY    . COLONOSCOPY  07/17/2018   at [facility]  . HEMORRHOID SURGERY    . LUMBAR LAMINECTOMY    . POLYPECTOMY    . RIGHT/LEFT HEART CATH AND CORONARY ANGIOGRAPHY N/A 03/07/2019   Procedure: RIGHT/LEFT HEART CATH AND CORONARY ANGIOGRAPHY;  Surgeon: Burnell Blanks, MD;  Location: [place] CV LAB;  Service: Cardiovascular;  Laterality: N/A;  . TEE WITHOUT CARDIOVERSION N/A 01/26/2013   Procedure: TRANSESOPHAGEAL ECHOCARDIOGRAM (TEE);  Surgeon: Larey Dresser, MD;  Location: [place];  Service: Cardiovascular;  Laterality: N/A;  . TEE WITHOUT CARDIOVERSION N/A 10/28/2017   Procedure: TRANSESOPHAGEAL ECHOCARDIOGRAM (TEE);  Surgeon: Larey Dresser, MD;  Location: [facility] ENDOSCOPY;  Service: Cardiovascular;  Laterality: N/A;  . TEE WITHOUT CARDIOVERSION N/A 05/08/2019   Procedure: TRANSESOPHAGEAL ECHOCARDIOGRAM (TEE);  Surgeon: Burnell Blanks, MD;  Location: [place] CV LAB;  Service: Open Heart Surgery;  Laterality: N/A;  . TRANSCATHETER AORTIC VALVE REPLACEMENT, TRANSFEMORAL N/A 05/08/2019   Procedure: TRANSCATHETER AORTIC VALVE REPLACEMENT, TRANSFEMORAL;  Surgeon: Burnell Blanks, MD;  Location: [place] CV LAB;  Service: Open Heart Surgery;  Laterality: N/A;    SOCIAL HISTORY: Social History   Socioeconomic History  . Marital status: Married    Spouse name: Not on file  . Number of children: 0  . Years of education: Not on file  . Highest education level: Not on file  Occupational History  . Occupation: Retired Lobbyist: [employer]  Social Needs  . Financial resource strain: Not on file  . Food insecurity    Worry: Not on file    Inability: Not on file  . Transportation needs    Medical: Not on file    Non-medical: Not on file   Tobacco Use  . Smoking status: Never Smoker  . Smokeless tobacco: Never Used  Substance and Sexual Activity  . Alcohol use: Yes    Comment: Drinks 1 glass of wine nightly/socially  . Drug use: No  . Sexual activity: Yes  Lifestyle  . Physical activity    Days per week: Not on file    Minutes per session: Not on file  . Stress: Not on file  Relationships  . Social Herbalist on phone: Not on file    Gets together: Not on file    Attends religious service: Not on file    Active member of club or organization: Not on file    Attends meetings of clubs or organizations: Not on file    Relationship status: Not on file  . Intimate partner violence    Fear of current or ex partner: Not on file    Emotionally abused: Not on file    Physically abused: Not on file    Forced sexual activity: Not on file  Other Topics Concern  . Not on file  Social History Narrative   Patient lives in [place] w/ his wife. He is a native of [place]. 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   Right-handed   Caffeine: 2 cups coffee per day    FAMILY HISTORY: Family History  Problem Relation Age of Onset  . Colon cancer Mother 48  . Hypertension Other   . Coronary artery disease Neg Hx   . Colon polyps Neg Hx   . Esophageal cancer Neg Hx   . Rectal cancer Neg Hx   . Stomach cancer Neg Hx     ALLERGIES:  is allergic to xarelto [rivaroxaban]; corticosteroids; ramipril; and benazepril.  MEDICATIONS:  Current Outpatient Medications  Medication Sig Dispense Refill  . acyclovir (ZOVIRAX) 400 MG tablet Take 1 tablet (400 mg total) by mouth 2 (two) times daily. 60 tablet 3  . amLODipine (NORVASC) 5 MG tablet Take 1 tablet (5 mg total) by mouth daily. 90 tablet 3  . aspirin 81 MG chewable tablet Chew 1 tablet (81 mg total) by mouth daily.    [name] dexamethasone (DECADRON) 4 MG tablet Take 3 tablets (12 mg total) by mouth once a week. 20 tablet 6  . DULoxetine  (CYMBALTA) 20 MG capsule Take 2 capsules (40 mg total) by mouth daily. 30 capsule  1  . ELIQUIS 5 MG TABS tablet TAKE 1 TABLET BY MOUTH TWICE A DAY 60 tablet 6  . ergocalciferol (VITAMIN D2) 1.25 MG (50000 UT) capsule Take 1 capsule (50,000 Units total) by mouth once a week. 12 capsule 3  . famotidine (PEPCID) 40 MG tablet Take 1 tablet (40 mg total) by mouth daily. 30 tablet 11  . fentaNYL (DURAGESIC) 12 MCG/HR Place 1 patch onto the skin every 3 (three) days. 10 patch 0  . furosemide (LASIX) 20 MG tablet Take 3 tablets (60 mg total) by mouth daily. 270 tablet 1  . lenalidomide (REVLIMID) 15 MG capsule Take 1 capsule (15 mg total) by mouth daily. Take for 14 days on, 7 days off, repeat every 21 days. 14 capsule 0  . loratadine (CLARITIN) 10 MG tablet Take 1 tablet (10 mg total) by mouth daily. 30 tablet 11  . LORazepam (ATIVAN) 0.5 MG tablet Take 1 tablet (0.5 mg total) by mouth every 6 (six) hours as needed (Nausea or vomiting). 30 tablet 0  . metoprolol succinate (TOPROL-XL) 25 MG 24 hr tablet Take 1 tablet (25 mg total) by mouth 2 (two) times daily. 60 tablet 6  . ondansetron (ZOFRAN) 8 MG tablet Take 1 tablet (8 mg total) by mouth 2 (two) times daily as needed (Nausea or vomiting). 30 tablet 1  . oxyCODONE (OXY IR/ROXICODONE) 5 MG immediate release tablet Take 1-2 tablets (5-10 mg total) by mouth every 4 (four) hours as needed for moderate pain or severe pain. 30 tablet 0  . potassium chloride SA (KLOR-CON) 20 MEQ tablet 41mq po twice daily for 4 days then 43m po once daily ongoing 60 tablet 1  . prochlorperazine (COMPAZINE) 10 MG tablet Take 1 tablet (10 mg total) by mouth every 6 (six) hours as needed (Nausea or vomiting). 30 tablet 1  . senna-docusate (SENNA S) 8.6-50 MG tablet Take 2 tablets by mouth at bedtime. 60 tablet 2  . spironolactone (ALDACTONE) 25 MG tablet Take 1 tablet (25 mg total) by mouth daily. 90 tablet 3   No current facility-administered medications for this visit.      REVIEW OF SYSTEMS:   A 10+ POINT REVIEW OF SYSTEMS WAS OBTAINED including neurology, dermatology, psychiatry, cardiac, respiratory, lymph, extremities, GI, GU, Musculoskeletal, constitutional, breasts, reproductive, HEENT.  All pertinent positives are noted in the HPI.  All others are negative.   PHYSICAL EXAMINATION: ECOG FS:1 - Symptomatic but completely ambulatory  Vitals:   07/24/19 0908  BP: 133/65  Pulse: 65  Resp: 18  Temp: 98.2 F (36.8 C)  SpO2: 100%   Wt Readings from Last 3 Encounters:  07/24/19 190 lb 4.8 oz (86.3 kg)  07/17/19 180 lb 9.6 oz (81.9 kg)  07/03/19 193 lb 11.2 oz (87.9 kg)   Body mass index is 26.54 kg/m.     GENERAL:alert, in no acute distress and comfortable SKIN: no acute rashes, no significant lesions EYES: conjunctiva are pink and non-injected, sclera anicteric OROPHARYNX: MMM, no exudates, no oropharyngeal erythema or ulceration NECK: supple, no JVD LYMPH:  no palpable lymphadenopathy in the cervical, axillary or inguinal regions LUNGS: clear to auscultation b/l with normal respiratory effort HEART: regular rate & rhythm ABDOMEN:  normoactive bowel sounds , non tender, not distended. No palpable hepatosplenomegaly.  Extremity: 1+ pedal edema PSYCH: alert & oriented x 3 with fluent speech NEURO: no focal motor/sensory deficits  LABORATORY DATA:  I have reviewed the data as listed  . CBC Latest Ref Rng & Units 07/24/2019 07/17/2019  07/10/2019  WBC 4.0 - 10.5 K/uL 6.6 9.5 13.0(H)  Hemoglobin 13.0 - 17.0 g/dL 10.5(L) 11.5(L) 10.8(L)  Hematocrit 39.0 - 52.0 % 33.0(L) 33.8(L) 33.0(L)  Platelets 150 - 400 K/uL 184 130(L) 164    . CMP Latest Ref Rng & Units 07/24/2019 07/17/2019 07/10/2019  Glucose 70 - 99 mg/dL 114(H) 122(H) 123(H)  BUN 8 - 23 mg/dL _0 Creatinine 0.61 - 1.24 mg/dL 0.92 1.24 1.14  Sodium 135 - 145 mmol/L 145 143 140  Potassium 3.5 - 5.1 mmol/L 3.9 2.6(LL) 3.6  Chloride 98 - 111 mmol/L 113(H) 102 105  CO2 22 - 32  mmol/L _1 Calcium 8.9 - 10.3 mg/dL 8.5(L) 8.9 8.6(L)  Total Protein 6.5 - 8.1 g/dL 6.1(L) 6.4(L) 6.5  Total Bilirubin 0.3 - 1.2 mg/dL 0.5 0.9 0.6  Alkaline Phos 38 - 126 U/L 72 70 71  AST 15 - 41 U/L 20 38 36  ALT 0 - 44 U/L 19 31 38   Component     Latest Ref Rng & Units 04/30/2019  Total Protein, Urine-UPE24     Not Estab. mg/dL   Total Protein, Urine-Ur/day     30 - 150 mg/24 hr   ALBUMIN, U     %   ALPHA 1 URINE     %   Alpha 2, Urine     %   % BETA, Urine     %   GAMMA GLOBULIN URINE     %   Free Kappa Lt Chains,Ur     0.63 - 113.79 mg/L   Free Lambda Lt Chains,Ur     0.47 - 11.77 mg/L   Free Kappa/Lambda Ratio     1.03 - 31.76   Immunofixation Result, Urine        Total Volume        M-SPIKE %, Urine     Not Observed %   M-Spike, mg/24 hr     Not Observed mg/24 hr   NOTE:        IgG (Immunoglobin G), Serum     603 - 1,613 mg/dL 1,740 (H)  IgA     61 - 437 mg/dL 16 (L)  IgM (Immunoglobulin M), Srm     15 - 143 mg/dL 9 (L)  Total Protein ELP     6.0 - 8.5 g/dL 7.3  Albumin SerPl Elph-Mcnc     2.9 - 4.4 g/dL 3.8  Alpha 1     0.0 - 0.4 g/dL 0.3  Alpha2 Glob SerPl Elph-Mcnc     0.4 - 1.0 g/dL 0.7  B-Globulin SerPl Elph-Mcnc     0.7 - 1.3 g/dL 0.9  Gamma Glob SerPl Elph-Mcnc     0.4 - 1.8 g/dL 1.6  M Protein SerPl Elph-Mcnc     Not Observed g/dL 1.5 (H)  Globulin, Total     2.2 - 3.9 g/dL 3.5  Albumin/Glob SerPl     0.7 - 1.7 1.1  IFE 1      Comment  Please Note (HCV):      Comment  Kappa free light chain     3.3 - 19.4 mg/L 821.2 (H)  Lamda free light chains     5.7 - 26.3 mg/L 1.9 (L)  Kappa, lamda light chain ratio     0.26 - 1.65 432.21 (H)  LDH     98 - 192 U/L 132  Sed Rate     0 - 16 mm/hr 3   05/24/2019 FISH  Panel     05/24/2019 Bone Marrow Biopsy    04/16/2019 Surgical Pathology:   RADIOGRAPHIC STUDIES: I have personally reviewed the radiological images as listed and agreed with the findings in the report. No  results found.  ASSESSMENT & PLAN:   78 yo   #1 Recently diagnosed Plasma cell myeloma  03/28/2019 MRI pelvis w/wo contrast revealed "1. Destructive bone lesions as detailed above. Findings most consistent with metastatic disease. PET-CT may be helpful for further evaluation and to establish a primary tumor. The right pelvic bone lesions should be amenable to image guided biopsy but a PET scan may demonstrate easier/safer biopsy sites. 2. No intrapelvic mass or adenopathy. 3. Benign intraosseous lipoma involving the left anterior superior acetabulum."  04/06/2019 PET whole body revealed "1. Diffuse osseous metastatic disease as detailed above without findings for a primary neoplasm in the chest, abdomen or pelvis. The large destructive lesion involving the right ischium should be amenable to image guided biopsy. 2. Two small retroperitoneal lymph nodes and 1 small right obturator node showing hypermetabolism."   04/16/2019 Posterior right pelvis bone biopsy revealed "PLASMA CELL NEOPLASM"  05/24/2019 Bone Marrow Biopsy revealed "BONE MARROW: - CELLULAR MARROW WITH INVOLVEMENT BY PLASMA CELL NEOPLASM (20%) PERIPHERAL BLOOD: - MORPHOLOGICALLY UNREMARKABLE"  05/24/2019 FISH Panel revealed "ABNORMAL result with 11q+, 14q+ and +17"  #2 Severe aortic stenosis with bicuspid aortic valve -10/26/2018 ECHO revealed AVA at 0.8 cm2 and LV EF of 60-65% -05/08/2019 pt had a Transfemoral Transcatheter Aortic Valve Replacement   PLAN: -Discussed pt labwork today, 07/24/19;  all values are WNL except for RBC at 3.49, Hgb at 10.5, HCT at 33.0, Eos Abs at 0.6K, Chloride at 113, Glucose at 114, Calcium at 8.5, Total Protein at 6.1. -Discussed 07/24/2019 MMP is in progress  -M protein coming down; 0.4 g/dL from 1.5 g/dL -Discussed 07/24/2019 K/L light chains is in progress  -Discussed 07/17/2019 K/L light chains have normalized (normal K/L ratio) -Recommended that the pt continue to eat well, drink at least  48-64 oz of water each day, and walk 20-30 minutes each day.  -Discontinue Velcade due to Grade 2-3 neuropathy -Will hold Revlimid until next visit, will re-evaluate symptoms  -Advised pt to take 2 tablets of Senna BID if necessary  -Advised pt to take 2 Oxycodone every 4-6 hours as needed  -Restart Lasix at 20 mg per day  -Continue Cymbalta 60 mg  -Continue Zometa every 4 weeks -Continue Loratadine and Famotidine    -Rx Fentanyl Patch 12.5 mcg -Will see back in 3 weeks with labs    FOLLOW UP: Please cancel all appointments for Velcade. Continue Zometa q4weeks- next dose as scheduled on 07/31/2019. Please schedule next 2 doses RTC with Dr Irene Limbo in 3 weeks with labs   The total time spent in the appt was 30 minutes and more than 50% was on counseling and direct patient cares.  All of the patient's questions were answered with apparent satisfaction. The patient knows to call the clinic with any problems, questions or concerns.   Sullivan Lone MD [place] AAHIVMS [facility] Hematology/Oncology Physician [facility]  (Office):       [phone] (Work cell):  [phone] (Fax):           [phone]  07/24/2019 9:58 AM  I, Yevette Edwards, am acting as a scribe for Dr. Sullivan Lone.   .I have reviewed the above documentation for accuracy and completeness, and I agree with the above. Brunetta Genera MD

## 2019-07-25 ENCOUNTER — Telehealth: Payer: Self-pay | Admitting: Hematology

## 2019-07-25 LAB — KAPPA/LAMBDA LIGHT CHAINS
Kappa free light chain: 10.6 mg/L (ref 3.3–19.4)
Kappa, lambda light chain ratio: 1.33 (ref 0.26–1.65)
Lambda free light chains: 8 mg/L (ref 5.7–26.3)

## 2019-07-25 NOTE — Telephone Encounter (Signed)
Scheduled per los. Called and spoke with wife.confirmed appts

## 2019-07-26 ENCOUNTER — Other Ambulatory Visit: Payer: Self-pay | Admitting: Hematology

## 2019-07-26 LAB — MULTIPLE MYELOMA PANEL, SERUM
Albumin SerPl Elph-Mcnc: 3.7 g/dL (ref 2.9–4.4)
Albumin/Glob SerPl: 1.8 — ABNORMAL HIGH (ref 0.7–1.7)
Alpha 1: 0.3 g/dL (ref 0.0–0.4)
Alpha2 Glob SerPl Elph-Mcnc: 0.5 g/dL (ref 0.4–1.0)
B-Globulin SerPl Elph-Mcnc: 0.8 g/dL (ref 0.7–1.3)
Gamma Glob SerPl Elph-Mcnc: 0.5 g/dL (ref 0.4–1.8)
Globulin, Total: 2.1 g/dL — ABNORMAL LOW (ref 2.2–3.9)
IgA: 34 mg/dL — ABNORMAL LOW (ref 61–437)
IgG (Immunoglobin G), Serum: 505 mg/dL — ABNORMAL LOW (ref 603–1613)
IgM (Immunoglobulin M), Srm: 24 mg/dL (ref 15–143)
M Protein SerPl Elph-Mcnc: 0.3 g/dL — ABNORMAL HIGH
Total Protein ELP: 5.8 g/dL — ABNORMAL LOW (ref 6.0–8.5)

## 2019-07-27 ENCOUNTER — Ambulatory Visit: Payer: Medicare Other

## 2019-07-30 ENCOUNTER — Emergency Department (HOSPITAL_BASED_OUTPATIENT_CLINIC_OR_DEPARTMENT_OTHER): Payer: Medicare Other

## 2019-07-30 ENCOUNTER — Other Ambulatory Visit: Payer: Self-pay

## 2019-07-30 ENCOUNTER — Telehealth: Payer: Self-pay | Admitting: *Deleted

## 2019-07-30 ENCOUNTER — Emergency Department (HOSPITAL_COMMUNITY)
Admission: EM | Admit: 2019-07-30 | Discharge: 2019-07-30 | Disposition: A | Discharge status: Home / Self Care | Payer: Medicare Other | Attending: Emergency Medicine | Admitting: Emergency Medicine

## 2019-07-30 ENCOUNTER — Encounter (HOSPITAL_COMMUNITY): Payer: Self-pay

## 2019-07-30 DIAGNOSIS — R2243 Localized swelling, mass and lump, lower limb, bilateral: Secondary | ICD-10-CM | POA: Diagnosis not present

## 2019-07-30 DIAGNOSIS — E876 Hypokalemia: Secondary | ICD-10-CM | POA: Diagnosis not present

## 2019-07-30 DIAGNOSIS — I2510 Atherosclerotic heart disease of native coronary artery without angina pectoris: Secondary | ICD-10-CM | POA: Diagnosis not present

## 2019-07-30 DIAGNOSIS — R001 Bradycardia, unspecified: Secondary | ICD-10-CM | POA: Diagnosis not present

## 2019-07-30 DIAGNOSIS — I5032 Chronic diastolic (congestive) heart failure: Secondary | ICD-10-CM | POA: Diagnosis not present

## 2019-07-30 DIAGNOSIS — Z7982 Long term (current) use of aspirin: Secondary | ICD-10-CM | POA: Insufficient documentation

## 2019-07-30 DIAGNOSIS — M79604 Pain in right leg: Secondary | ICD-10-CM | POA: Diagnosis present

## 2019-07-30 DIAGNOSIS — Z7901 Long term (current) use of anticoagulants: Secondary | ICD-10-CM | POA: Diagnosis not present

## 2019-07-30 DIAGNOSIS — R600 Localized edema: Secondary | ICD-10-CM | POA: Diagnosis not present

## 2019-07-30 DIAGNOSIS — I110 Hypertensive heart disease with heart failure: Secondary | ICD-10-CM | POA: Diagnosis not present

## 2019-07-30 DIAGNOSIS — Z79899 Other long term (current) drug therapy: Secondary | ICD-10-CM | POA: Insufficient documentation

## 2019-07-30 DIAGNOSIS — M79605 Pain in left leg: Secondary | ICD-10-CM

## 2019-07-30 DIAGNOSIS — R52 Pain, unspecified: Secondary | ICD-10-CM | POA: Diagnosis not present

## 2019-07-30 HISTORY — DX: Malignant (primary) neoplasm, unspecified: C80.1

## 2019-07-30 LAB — COMPREHENSIVE METABOLIC PANEL
ALT: 25 U/L (ref 0–44)
AST: 26 U/L (ref 15–41)
Albumin: 4.5 g/dL (ref 3.5–5.0)
Alkaline Phosphatase: 77 U/L (ref 38–126)
Anion gap: 11 (ref 5–15)
BUN: 20 mg/dL (ref 8–23)
CO2: 25 mmol/L (ref 22–32)
Calcium: 9.5 mg/dL (ref 8.9–10.3)
Chloride: 103 mmol/L (ref 98–111)
Creatinine, Ser: 0.85 mg/dL (ref 0.61–1.24)
GFR calc Af Amer: >60 mL/min (ref >60)
GFR calc non Af Amer: >60 mL/min (ref >60)
Glucose, Bld: 142 mg/dL — ABNORMAL HIGH (ref 70–99)
Potassium: 2.7 mmol/L — CL (ref 3.5–5.1)
Sodium: 139 mmol/L (ref 135–145)
Total Bilirubin: 0.8 mg/dL (ref 0.3–1.2)
Total Protein: 6.5 g/dL (ref 6.5–8.1)

## 2019-07-30 LAB — CBC WITH DIFFERENTIAL/PLATELET
Abs Immature Granulocytes: 0.02 10*3/uL (ref 0.00–0.07)
Basophils Absolute: 0.1 10*3/uL (ref 0.0–0.1)
Basophils Relative: 1 %
Eosinophils Absolute: 0 10*3/uL (ref 0.0–0.5)
Eosinophils Relative: 1 %
HCT: 35.9 % — ABNORMAL LOW (ref 39.0–52.0)
Hemoglobin: 11.8 g/dL — ABNORMAL LOW (ref 13.0–17.0)
Immature Granulocytes: 0 %
Lymphocytes Relative: 11 %
Lymphs Abs: 0.5 10*3/uL — ABNORMAL LOW (ref 0.7–4.0)
MCH: 30 pg (ref 26.0–34.0)
MCHC: 32.9 g/dL (ref 30.0–36.0)
MCV: 91.3 fL (ref 80.0–100.0)
Monocytes Absolute: 0.1 10*3/uL (ref 0.1–1.0)
Monocytes Relative: 3 %
Neutro Abs: 3.7 10*3/uL (ref 1.7–7.7)
Neutrophils Relative %: 84 %
Platelets: 186 10*3/uL (ref 150–400)
RBC: 3.93 MIL/uL — ABNORMAL LOW (ref 4.22–5.81)
RDW: 14.4 % (ref 11.5–15.5)
WBC: 4.5 10*3/uL (ref 4.0–10.5)
nRBC: 0 % (ref 0.0–0.2)

## 2019-07-30 LAB — TROPONIN I (HIGH SENSITIVITY)
Troponin I (High Sensitivity): 19 ng/L — ABNORMAL HIGH (ref <18)
Troponin I (High Sensitivity): 15 ng/L (ref <18)

## 2019-07-30 LAB — URINALYSIS, ROUTINE W REFLEX MICROSCOPIC
Bilirubin Urine: NEGATIVE
Glucose, UA: NEGATIVE mg/dL
Hgb urine dipstick: NEGATIVE
Ketones, ur: NEGATIVE mg/dL
Leukocytes,Ua: NEGATIVE
Nitrite: NEGATIVE
Protein, ur: NEGATIVE mg/dL
Specific Gravity, Urine: 1.014 (ref 1.005–1.030)
pH: 6 (ref 5.0–8.0)

## 2019-07-30 LAB — BRAIN NATRIURETIC PEPTIDE: B Natriuretic Peptide: 126.5 pg/mL — ABNORMAL HIGH (ref 0.0–100.0)

## 2019-07-30 MED ORDER — FENTANYL CITRATE (PF) 100 MCG/2ML IJ SOLN
50.0000 ug | Freq: Once | INTRAMUSCULAR | Status: AC
  Administered 2019-07-30: 11:00:00 50 ug via INTRAVENOUS
  Filled 2019-07-30: qty 2

## 2019-07-30 MED ORDER — POTASSIUM CHLORIDE 10 MEQ/100ML IV SOLN
10.0000 meq | INTRAVENOUS | Status: AC
  Administered 2019-07-30 (×2): 10 meq via INTRAVENOUS
  Filled 2019-07-30 (×2): qty 100

## 2019-07-30 MED ORDER — FENTANYL CITRATE (PF) 100 MCG/2ML IJ SOLN
50.0000 ug | Freq: Once | INTRAMUSCULAR | Status: AC
  Administered 2019-07-30: 50 ug via INTRAVENOUS
  Filled 2019-07-30: qty 2

## 2019-07-30 MED ORDER — OXYCODONE-ACETAMINOPHEN 5-325 MG PO TABS
1.0000 | ORAL_TABLET | Freq: Once | ORAL | Status: AC
  Administered 2019-07-30: 1 via ORAL
  Filled 2019-07-30: qty 1

## 2019-07-30 MED ORDER — OXYCODONE-ACETAMINOPHEN 5-325 MG PO TABS: 2.0000 | ORAL_TABLET | ORAL | 0 refills | Status: DC | PRN

## 2019-07-30 NOTE — ED Notes (Signed)
Date and time results received: 07/30/19 12:35 PM  (use smartphrase ".now" to insert current time)  Test: potassium Critical Value: 2.7  Name of Provider Notified: [place]

## 2019-07-30 NOTE — Telephone Encounter (Signed)
Pt wife called - still in ED. Tests neg for blood clots. Has received 2 potassium tablets and pain med x 3. Wife states ED MD thought it might be good for pt to be seen while in  Infusion tomorrow. Advised her that Dr.Kale will be given message.

## 2019-07-30 NOTE — ED Triage Notes (Signed)
Patient c/o bilateral leg swelling and pain x 2 weeks,but getting progressively worse.

## 2019-07-30 NOTE — Discharge Instructions (Signed)
Your work-up today was overall reassuring.  Your leg ultrasound did not show evidence of DVT/blood clot.  Your kidney function was similar to prior and your BNP (for heart failure) was improved.  As we ruled out several of these problems, we feel you are safe for discharge home.  Please use elevation and compression stockings to help with the swelling and discussed with your primary doctor about further diuretic use and pain medicine use.  As you tolerated adding a pain pill to your patch, we gave you prescription for some pain medication.  He also had low potassium which we repleted.  Please follow-up with them tomorrow for further management.

## 2019-07-30 NOTE — Progress Notes (Signed)
Bilateral lower extremity venous duplex has been completed. Preliminary results can be found in CV Proc through chart review.  Results were given to Dr. Sherry Ruffing.  07/30/19 11:02 AM [name] [name] RVT

## 2019-07-30 NOTE — ED Provider Notes (Signed)
[facility] DEPT Provider Note   CSN: [id number] Arrival date & time: 07/30/19  0945     History   Chief Complaint Chief Complaint  Patient presents with   Leg Swelling   Leg Pain    HPI [name] [name] is a 78 y.o. male.     The history is provided by the patient and medical records. No language interpreter was used.  Leg Pain Location:  Leg Time since incident:  2 weeks Injury: no   Leg location:  L lower leg and R lower leg Pain details:    Quality:  Aching and cramping   Radiates to:  Does not radiate   Severity:  Severe   Onset quality:  Gradual Chronicity:  New Tetanus status:  Unknown Prior injury to area:  No Relieved by:  Nothing Worsened by:  Nothing Ineffective treatments:  None tried Associated symptoms: swelling   Associated symptoms: no back pain, no decreased ROM, no fatigue, no fever, no itching, no muscle weakness and no numbness     Past Medical History:  Diagnosis Date   Acute blood loss anemia    Adenomatous colon polyp    Ascending aortic aneurysm (HCC)    Bicuspid aortic valve    Cancer (HCC)    Chest pain    ETT-myoview 12/11 w/exercise, no chest pain, no significant ST changes, EF 69%, no evidence for ischemia or infarction.   CHF NYHA class I (no symptoms from ordinary activities), acute, diastolic (HCC)    Fatty liver    mild   GI bleed    GI bleeding 07/21/2018   post polypectomy   Hemorrhoids    HTN (hypertension)    Hypercholesteremia    Hypokalemia    Internal hemorrhoids    LBP (low back pain)    Moderate aortic stenosis    Osteoarthritis    Paroxysmal atrial fibrillation ([place])    a. new onset Afib in 07/2008. He underwent ibutilide cardioversion successfully. b. Recurrence 01/2013 s/p TEE/DCCV - was on Xarelto but he stopped it as he was convinced it was causing joint pn. c. Recurrence 01/2016 - spont conv to NSR. Pt took Eliquis x1 mo then declined further anticoag. d.  Recurrence 07/2016.   Pneumonia    Tubular adenoma of colon     Patient Active Problem List   Diagnosis Date Noted   Multiple myeloma not having achieved remission ([place]) 05/17/2019   Counseling regarding advance care planning and goals of care 05/17/2019   Bone metastases ([place]) 05/17/2019   Postoperative fever 05/09/2019   S/P TAVR (transcatheter aortic valve replacement) 05/08/2019   Multiple myeloma ([place]) 05/08/2019   Acute on chronic diastolic heart failure ([place]) 05/08/2019   Severe aortic stenosis    Polymyalgia rheumatica syndrome ([place]) 01/29/2019   CAD (coronary artery disease) 08/01/2018   HTN (hypertension) 02/28/2018   Hypercholesteremia 02/28/2018   Mastoiditis 03/21/2017   Abnormal TSH 12/23/2016   Parapneumonic effusion    Ascending aortic aneurysm ([place]) 02/15/2013   Well adult exam 05/31/2012   PAF (paroxysmal atrial fibrillation) ([place]) 08/05/2008   HLD (hyperlipidemia) 04/08/2007   Essential hypertension 04/08/2007    Past Surgical History:  Procedure Laterality Date   BACK SURGERY  x12 years ago   CARDIOVERSION N/A 01/26/2013   Procedure: CARDIOVERSION;  Surgeon: Larey Dresser, MD;  Location: [facility];  Service: Cardiovascular;  Laterality: N/A;   CARDIOVERSION N/A 10/28/2017   Procedure: CARDIOVERSION;  Surgeon: Larey Dresser, MD;  Location: [facility] ENDOSCOPY;  Service: Cardiovascular;  Laterality: N/A;   CARDIOVERSION N/A 03/03/2018   Procedure: CARDIOVERSION;  Surgeon: Lelon Perla, MD;  Location: [facility] ENDOSCOPY;  Service: Cardiovascular;  Laterality: N/A;   COLONOSCOPY     COLONOSCOPY  07/17/2018   at [place]     POLYPECTOMY     RIGHT/LEFT HEART CATH AND CORONARY ANGIOGRAPHY N/A 03/07/2019   Procedure: RIGHT/LEFT HEART CATH AND CORONARY ANGIOGRAPHY;  Surgeon: Burnell Blanks, MD;  Location: [facility] CV LAB;  Service: Cardiovascular;  Laterality: N/A;   TEE WITHOUT  CARDIOVERSION N/A 01/26/2013   Procedure: TRANSESOPHAGEAL ECHOCARDIOGRAM (TEE);  Surgeon: Larey Dresser, MD;  Location: [facility];  Service: Cardiovascular;  Laterality: N/A;   TEE WITHOUT CARDIOVERSION N/A 10/28/2017   Procedure: TRANSESOPHAGEAL ECHOCARDIOGRAM (TEE);  Surgeon: Larey Dresser, MD;  Location[facility] END[facility]COPY;  Service: Cardiovascular;  Laterality: N/A;   TEE WITHOUT CARDIOVERSION N/A 05/08/2019   Procedure: TRANSESOPHAGEAL ECHOCARDIOGRAM (TEE);  Surgeon: Burnell Blanks, MD;  Location: [facility] CV LAB;  Service: Open Heart Surgery;  Laterality: N/A;   TRANSCATHETER AORTIC VALVE REPLACEMENT, TRANSFEMORAL N/A 05/08/2019   Procedure: TRANSCATHETER AORTIC VALVE REPLACEMENT, TRANSFEMORAL;  Surgeon: Burnell Blanks, MD;  Location: [facility] LAB;  Service: Open Heart Surgery;  Laterality: N/A;        Home Medications    Prior to Admission medications   Medication Sig Start Date End Date Taking? Authorizing Provider  acyclovir (ZOVIRAX) 400 MG tablet Take 1 tablet (400 mg total) by mouth 2 (two) times daily. 05/17/19   Brunetta Genera, MD  amLODipine (NORVASC) 5 MG tablet Take 1 tablet (5 mg total) by mouth daily. 05/16/19 05/10/20  Eileen Stanford, PA-C  aspirin 81 MG chewable tablet Chew 1 tablet (81 mg total) by mouth daily. 05/12/19   Eileen Stanford, PA-C  dexamethasone (DECADRON) 4 MG tablet Take 3 tablets (12 mg total) by mouth once a week. 06/12/19   Brunetta Genera, MD  DULoxetine (CYMBALTA) 20 MG capsule TAKE 2 CAPSULES BY MOUTH EVERY DAY 07/27/19   Brunetta Genera, MD  ELIQUIS 5 MG TABS tablet TAKE 1 TABLET BY MOUTH TWICE A DAY 07/09/19   Larey Dresser, MD  ergocalciferol (VITAMIN D2) 1.25 MG (50000 UT) capsule Take 1 capsule (50,000 Units total) by mouth once a week. 07/03/19   Brunetta Genera, MD  famotidine (PEPCID) 40 MG tablet Take 1 tablet (40 mg total) by mouth daily. 01/29/19   [name], [name], MD  fentaNYL  (DURAGESIC) 12 MCG/HR Place 1 patch onto the skin every 3 (three) days. 07/24/19   Brunetta Genera, MD  furosemide (LASIX) 20 MG tablet Take 3 tablets (60 mg total) by mouth daily. 06/11/19   Larey Dresser, MD  lenalidomide (REVLIMID) 15 MG capsule Take 1 capsule (15 mg total) by mouth daily. Take for 14 days on, 7 days off, repeat every 21 days. 07/05/19   Brunetta Genera, MD  loratadine (CLARITIN) 10 MG tablet Take 1 tablet (10 mg total) by mouth daily. 12/11/18   [name], [name], MD  LORazepam (ATIVAN) 0.5 MG tablet Take 1 tablet (0.5 mg total) by mouth every 6 (six) hours as needed (Nausea or vomiting). 05/17/19   Brunetta Genera, MD  metoprolol succinate (TOPROL-XL) 25 MG 24 hr tablet Take 1 tablet (25 mg total) by mouth 2 (two) times daily. 06/22/19   Larey Dresser, MD  ondansetron [facility]) 8  MG tablet Take 1 tablet (8 mg total) by mouth 2 (two) times daily as needed (Nausea or vomiting). 05/17/19   Brunetta Genera, MD  oxyCODONE (OXY IR/ROXICODONE) 5 MG immediate release tablet Take 1-2 tablets (5-10 mg total) by mouth every 4 (four) hours as needed for moderate pain or severe pain. 07/19/19   Brunetta Genera, MD  potassium chloride SA (KLOR-CON) 20 MEQ tablet 69mq po twice daily for 4 days then 438m po once daily ongoing 07/19/19   KaBrunetta GeneraMD  prochlorperazine (COMPAZINE) 10 MG tablet Take 1 tablet (10 mg total) by mouth every 6 (six) hours as needed (Nausea or vomiting). 05/17/19   KaBrunetta GeneraMD  senna-docusate (SENNA S) 8.6-50 MG tablet Take 2 tablets by mouth at bedtime. 06/19/19   KaBrunetta GeneraMD  spironolactone (ALDACTONE) 25 MG tablet Take 1 tablet (25 mg total) by mouth daily. 06/06/19 09/04/19  McLarey DresserMD    Family History Family History  Problem Relation Age of Onset   Colon cancer Mother 8016 Hypertension Other    Coronary artery disease Neg Hx    Colon polyps Neg Hx    Esophageal cancer Neg Hx     Rectal cancer Neg Hx    Stomach cancer Neg Hx     Social History Social History   Tobacco Use   Smoking status: Never Smoker   Smokeless tobacco: Never Used  Substance Use Topics   Alcohol use: Yes    Comment: Drinks 1 glass of wine nightly/socially   Drug use: No     Allergies   Xarelto [rivaroxaban], Corticosteroids, Ramipril, and Benazepril   Review of Systems Review of Systems  Constitutional: Negative for chills, diaphoresis, fatigue and fever.  Eyes: Negative for visual disturbance.  Respiratory: Negative for cough, chest tightness, shortness of breath and wheezing.   Cardiovascular: Positive for leg swelling. Negative for chest pain and palpitations.  Gastrointestinal: Negative for abdominal pain, constipation, diarrhea, nausea and vomiting.  Genitourinary: Positive for decreased urine volume. Negative for flank pain.  Musculoskeletal: Negative for back pain and neck stiffness.  Skin: Negative for itching.  Neurological: Negative for light-headedness, numbness and headaches.  Psychiatric/Behavioral: Negative for agitation.  All other systems reviewed and are negative.    Physical Exam Updated Vital Signs BP (!) 178/87 (BP Location: Left Arm)    Pulse 63    Temp (!) 97.4 F (36.3 C) (Oral)    Resp 16    Ht '5\' 11"'$  (1.803 m)    Wt 86.3 kg    SpO2 100%    BMI 26.54 kg/m   Physical Exam Vitals signs and nursing note reviewed.  Constitutional:      General: He is not in acute distress.    Appearance: He is well-developed. He is not ill-appearing, toxic-appearing or diaphoretic.  HENT:     Head: Normocephalic and atraumatic.  Eyes:     Conjunctiva/sclera: Conjunctivae normal.     Pupils: Pupils are equal, round, and reactive to light.  Neck:     Musculoskeletal: Neck supple.  Cardiovascular:     Rate and Rhythm: Normal rate and regular rhythm.     Pulses: Normal pulses.     Heart sounds: Murmur present.  Pulmonary:     Effort: Pulmonary effort is  normal. No respiratory distress.     Breath sounds: Normal breath sounds. No wheezing, rhonchi or rales.  Chest:     Chest wall: No tenderness.  Abdominal:  General: Abdomen is flat. There is no distension.     Palpations: Abdomen is soft.     Tenderness: There is no abdominal tenderness. There is no right CVA tenderness or left CVA tenderness.  Musculoskeletal:        General: Swelling and tenderness present. No signs of injury.     Right lower leg: He exhibits tenderness. Edema present.     Left lower leg: He exhibits tenderness. Edema present.  Skin:    General: Skin is warm and dry.     Capillary Refill: Capillary refill takes less than 2 seconds.     Coloration: Skin is not pale.     Findings: No erythema.  Neurological:     General: No focal deficit present.     Mental Status: He is alert.     Sensory: No sensory deficit.     Motor: No weakness.  Psychiatric:        Mood and Affect: Mood normal.      ED Treatments / Results  Labs (all labs ordered are listed, but only abnormal results are displayed) Labs Reviewed  CBC WITH DIFFERENTIAL/PLATELET - Abnormal; Notable for the following components:      Result Value   RBC 3.93 (*)    Hemoglobin 11.8 (*)    HCT 35.9 (*)    Lymphs Abs 0.5 (*)    All other components within normal limits  COMPREHENSIVE METABOLIC PANEL - Abnormal; Notable for the following components:   Potassium 2.7 (*)    Glucose, Bld 142 (*)    All other components within normal limits  BRAIN NATRIURETIC PEPTIDE - Abnormal; Notable for the following components:   B Natriuretic Peptide 126.5 (*)    All other components within normal limits  URINALYSIS, ROUTINE W REFLEX MICROSCOPIC - Abnormal; Notable for the following components:   Color, Urine AMBER (*)    APPearance HAZY (*)    All other components within normal limits  TROPONIN I (HIGH SENSITIVITY) - Abnormal; Notable for the following components:   Troponin I (High Sensitivity) 19 (*)    All  other components within normal limits  URINE CULTURE  TROPONIN I (HIGH SENSITIVITY)    EKG None  Radiology Vas [place] Lower Extremity Venous (dvt) (only [facility])  Result Date: 07/30/2019  Lower Venous Study Indications: Pain.  Risk Factors: None identified. Comparison Study: No prior studies. Performing Technologist: Oliver Hum RVT  Examination Guidelines: A complete evaluation includes B-mode imaging, spectral Doppler, color Doppler, and power Doppler as needed of all accessible portions of each vessel. Bilateral testing is considered an integral part of a complete examination. Limited examinations for reoccurring indications may be performed as noted.  +---------+---------------+---------+-----------+----------+--------------+  RIGHT     Compressibility Phasicity Spontaneity Properties Thrombus Aging  +---------+---------------+---------+-----------+----------+--------------+  CFV       Full            Yes       Yes                                    +---------+---------------+---------+-----------+----------+--------------+  SFJ       Full                                                             +---------+---------------+---------+-----------+----------+--------------+  FV Prox   Full                                                             +---------+---------------+---------+-----------+----------+--------------+  FV Mid    Full                                                             +---------+---------------+---------+-----------+----------+--------------+  FV Distal Full                                                             +---------+---------------+---------+-----------+----------+--------------+  PFV       Full                                                             +---------+---------------+---------+-----------+----------+--------------+  POP       Full            Yes       Yes                                     +---------+---------------+---------+-----------+----------+--------------+  PTV       Full                                                             +---------+---------------+---------+-----------+----------+--------------+  PERO      Full                                                             +---------+---------------+---------+-----------+----------+--------------+   +---------+---------------+---------+-----------+----------+--------------+  LEFT      Compressibility Phasicity Spontaneity Properties Thrombus Aging  +---------+---------------+---------+-----------+----------+--------------+  CFV       Full            Yes       Yes                                    +---------+---------------+---------+-----------+----------+--------------+  SFJ       Full                                                             +---------+---------------+---------+-----------+----------+--------------+  FV Prox   Full                                                             +---------+---------------+---------+-----------+----------+--------------+  FV Mid    Full                                                             +---------+---------------+---------+-----------+----------+--------------+  FV Distal Full                                                             +---------+---------------+---------+-----------+----------+--------------+  PFV       Full                                                             +---------+---------------+---------+-----------+----------+--------------+  POP       Full            Yes       Yes                                    +---------+---------------+---------+-----------+----------+--------------+  PTV       Full                                                             +---------+---------------+---------+-----------+----------+--------------+  PERO      Full                                                              +---------+---------------+---------+-----------+----------+--------------+     Summary: Right: There is no evidence of deep vein thrombosis in the lower extremity. No cystic structure found in the popliteal fossa. Left: There is no evidence of deep vein thrombosis in the lower extremity. No cystic structure found in the popliteal fossa.  *See table(s) above for measurements and observations.    Preliminary     Procedures Procedures (including critical care time)  Medications Ordered in ED Medications  fentaNYL (SUBLIMAZE) injection 50 mcg (50 mcg Intravenous Given 07/30/19 1110)  fentaNYL (SUBLIMAZE) injection 50 mcg (50 mcg Intravenous Given 07/30/19 1337)  potassium chloride 10 mEq in 100 mL IVPB (10 mEq Intravenous New Bag/Given 07/30/19 1451)  oxyCODONE-acetaminophen (PERCOCET/ROXICET) 5-325 MG per tablet 1 tablet (1 tablet Oral  Given 07/30/19 1448)     Initial Impression / Assessment and Plan / ED Course  I have reviewed the triage vital signs and the nursing notes.  Pertinent labs & imaging results that were available during my care of the patient were reviewed by me and considered in my medical decision making (see chart for details).        [name] [name] is a 78 y.o. male with a past medical history significant for hypertension, hyperlipidemia, paroxysmal atrial fibrillation on Eliquis therapy, prior aortic aneurysm, valve replacement with TAVR, and metastatic bone cancer who presents with bilateral lower extremity pain, swelling, and darkened urine.  Patient reports that he has been on diuretics in the past for leg swelling but has never been persistent like this.  He reports the last few weeks have been worsening and due to the 9-10 pain he has not been able to sleep or walk well due to the symptoms.  He called his cancer doctor today who told him to come to the emergency department to rule out blood clots or other etiology of his worsened leg pain and swelling.  He denies any  chest pain, shortness breath, or palpitations.  He reports his urine has been slightly darker.  He reports no significant numbness, tingling, weakness.  He denies other complaints aside from a swollen and painful legs bilaterally.  On exam, patient does have pitting edema in both legs.  He has palpable pulses in both DP arteries and normal sensation and strength.  He was able to ambulate with severe pain getting into the bed.  Lungs were clear and chest was nontender.  He does have a systolic murmur.  Abdomen nontender.  Back nontender.  CVA areas nontender.  Clinically I am concerned about bilateral DVT versus new CHF or kidney dysfunction contrary to his worsened lower extremity edema and pain bilaterally.  Did not see evidence of acute infection on my exam.  Will get ultrasound to rule out DVT and labs to look for kidney and cardiac function.  Anticipate reassessment after work-up.  Work-up returned overall reassuring.  No evidence of UTI.  Kidney function not worsened.  BNP improved from prior.  DT ultrasounds negative.  Patient continued to have some pain and was given Percocet on top of his fentanyl patches.  Patient was able to tolerate this well and has improvement in pain.  Patient scheduled to have infusion tomorrow and will follow up with his oncology team.  His potassium is low and was repleted.  Had a shared decision-making conversation with the patient and family and they agree with discharge home to follow-up with outpatient team given the reassuring work-up to rule out blood clot, or start failure, or kidney failure.  There are no other questions or concerns and was discharged in good condition.   Final Clinical Impressions(s) / ED Diagnoses   Final diagnoses:  Leg edema  Bilateral lower extremity edema  Bilateral leg pain  Hypokalemia    ED Discharge Orders         Ordered    oxyCODONE-acetaminophen (PERCOCET/ROXICET) 5-325 MG tablet  Every 4 hours PRN     07/30/19 1545           Clinical Impression: 1. Leg edema   2. Bilateral lower extremity edema   3. Bilateral leg pain   4. Hypokalemia     Disposition: Discharge  Condition: Good  I have discussed the results, Dx and Tx plan with the pt(& family if present). He/she/they expressed  understanding and agree(s) with the plan. Discharge instructions discussed at great length. Strict return precautions discussed and pt &/or family have verbalized understanding of the instructions. No further questions at time of discharge.    New Prescriptions   OXYCODONE-ACETAMINOPHEN (PERCOCET/ROXICET) 5-325 MG TABLET    Take 2 tablets by mouth every 4 (four) hours as needed for severe pain.    Follow Up: Cassandria Anger, MD [place] [id number] [phone]     [facility]-EMERGENCY DEPT [place] [phone] mc [address] [place]       Ericberto Padget, Gwenyth Allegra, MD 07/30/19 [phone]

## 2019-07-30 NOTE — Telephone Encounter (Signed)
Patient called - experiencing severe pain in both legs, primarily between feet and knees, lower legs red and swollen. States pain is so severe he cannot stand. Current pain med (fenatnyl patch) is not relieving pain.  Patient advised to seek immediate medical attention and go to ED.  Patient states Dr.Kale asked him to call if this happened and he wants to know what to do. MD informed.  Contacted patient with MD recommendation to go to ED. Patient and wife verbalized understanding.

## 2019-07-30 NOTE — ED Notes (Signed)
Notified Dr. Sherry Ruffing that patient and spouse are requesting to see him. He states he will see them as soon as he can. Informed patient of this information. Patient is resting quietly.

## 2019-07-31 ENCOUNTER — Inpatient Hospital Stay: Payer: Medicare Other

## 2019-07-31 ENCOUNTER — Other Ambulatory Visit: Payer: Self-pay

## 2019-07-31 VITALS — BP 140/67 | HR 61 | Temp 98.7°F | Resp 18

## 2019-07-31 DIAGNOSIS — R0602 Shortness of breath: Secondary | ICD-10-CM | POA: Diagnosis not present

## 2019-07-31 DIAGNOSIS — C9000 Multiple myeloma not having achieved remission: Secondary | ICD-10-CM | POA: Diagnosis not present

## 2019-07-31 DIAGNOSIS — C7951 Secondary malignant neoplasm of bone: Secondary | ICD-10-CM | POA: Diagnosis not present

## 2019-07-31 DIAGNOSIS — G629 Polyneuropathy, unspecified: Secondary | ICD-10-CM

## 2019-07-31 DIAGNOSIS — Z7189 Other specified counseling: Secondary | ICD-10-CM

## 2019-07-31 DIAGNOSIS — R42 Dizziness and giddiness: Secondary | ICD-10-CM | POA: Diagnosis not present

## 2019-07-31 DIAGNOSIS — R630 Anorexia: Secondary | ICD-10-CM | POA: Diagnosis not present

## 2019-07-31 DIAGNOSIS — R634 Abnormal weight loss: Secondary | ICD-10-CM | POA: Diagnosis not present

## 2019-07-31 LAB — CMP (CANCER CENTER ONLY)
ALT: 22 U/L (ref 0–44)
AST: 19 U/L (ref 15–41)
Albumin: 4.2 g/dL (ref 3.5–5.0)
Alkaline Phosphatase: 77 U/L (ref 38–126)
Anion gap: 15 (ref 5–15)
BUN: 26 mg/dL — ABNORMAL HIGH (ref 8–23)
CO2: 23 mmol/L (ref 22–32)
Calcium: 9 mg/dL (ref 8.9–10.3)
Chloride: 104 mmol/L (ref 98–111)
Creatinine: 1 mg/dL (ref 0.61–1.24)
GFR, Est AFR Am: >60 mL/min (ref >60)
GFR, Estimated: >60 mL/min (ref >60)
Glucose, Bld: 116 mg/dL — ABNORMAL HIGH (ref 70–99)
Potassium: 3.1 mmol/L — ABNORMAL LOW (ref 3.5–5.1)
Sodium: 142 mmol/L (ref 135–145)
Total Bilirubin: 0.9 mg/dL (ref 0.3–1.2)
Total Protein: 6.3 g/dL — ABNORMAL LOW (ref 6.5–8.1)

## 2019-07-31 LAB — CBC WITH DIFFERENTIAL/PLATELET
Abs Immature Granulocytes: 0.03 10*3/uL (ref 0.00–0.07)
Basophils Absolute: 0 10*3/uL (ref 0.0–0.1)
Basophils Relative: 0 %
Eosinophils Absolute: 0 10*3/uL (ref 0.0–0.5)
Eosinophils Relative: 0 %
HCT: 36.2 % — ABNORMAL LOW (ref 39.0–52.0)
Hemoglobin: 12 g/dL — ABNORMAL LOW (ref 13.0–17.0)
Immature Granulocytes: 0 %
Lymphocytes Relative: 17 %
Lymphs Abs: 1.4 10*3/uL (ref 0.7–4.0)
MCH: 30.2 pg (ref 26.0–34.0)
MCHC: 33.1 g/dL (ref 30.0–36.0)
MCV: 91 fL (ref 80.0–100.0)
Monocytes Absolute: 0.9 10*3/uL (ref 0.1–1.0)
Monocytes Relative: 11 %
Neutro Abs: 5.9 10*3/uL (ref 1.7–7.7)
Neutrophils Relative %: 72 %
Platelets: 190 10*3/uL (ref 150–400)
RBC: 3.98 MIL/uL — ABNORMAL LOW (ref 4.22–5.81)
RDW: 14.5 % (ref 11.5–15.5)
WBC: 8.2 10*3/uL (ref 4.0–10.5)
nRBC: 0 % (ref 0.0–0.2)

## 2019-07-31 LAB — URINE CULTURE: Culture: NO GROWTH

## 2019-07-31 MED ORDER — SODIUM CHLORIDE 0.9 % IV SOLN
Freq: Once | INTRAVENOUS | Status: AC
  Administered 2019-07-31: 10:00:00 via INTRAVENOUS
  Filled 2019-07-31: qty 250

## 2019-07-31 MED ORDER — ZOLEDRONIC ACID 4 MG/100ML IV SOLN
INTRAVENOUS | Status: AC
  Filled 2019-07-31: qty 100

## 2019-07-31 MED ORDER — ZOLEDRONIC ACID 4 MG/100ML IV SOLN
4.0000 mg | Freq: Once | INTRAVENOUS | Status: AC
  Administered 2019-07-31: 4 mg via INTRAVENOUS

## 2019-07-31 NOTE — Patient Instructions (Signed)
Zoledronic Acid injection (Hypercalcemia, Oncology) What is this medicine? ZOLEDRONIC ACID (ZOE le dron ik AS id) lowers the amount of calcium loss from bone. It is used to treat too much calcium in your blood from cancer. It is also used to prevent complications of cancer that has spread to the bone. This medicine may be used for other purposes; ask your health care provider or pharmacist if you have questions. COMMON BRAND NAME(S): Zometa What should I tell my health care provider before I take this medicine? They need to know if you have any of these conditions:  aspirin-sensitive asthma  cancer, especially if you are receiving medicines used to treat cancer  dental disease or wear dentures  infection  kidney disease  receiving corticosteroids like dexamethasone or prednisone  an unusual or allergic reaction to zoledronic acid, other medicines, foods, dyes, or preservatives  pregnant or trying to get pregnant  breast-feeding How should I use this medicine? This medicine is for infusion into a vein. It is given by a health care professional in a hospital or clinic setting. Talk to your pediatrician regarding the use of this medicine in children. Special care may be needed. Overdosage: If you think you have taken too much of this medicine contact a poison control center or emergency room at once. NOTE: This medicine is only for you. Do not share this medicine with others. What if I miss a dose? It is important not to miss your dose. Call your doctor or health care professional if you are unable to keep an appointment. What may interact with this medicine?  certain antibiotics given by injection  NSAIDs, medicines for pain and inflammation, like ibuprofen or naproxen  some diuretics like bumetanide, furosemide  teriparatide  thalidomide This list may not describe all possible interactions. Give your health care provider a list of all the medicines, herbs, non-prescription  drugs, or dietary supplements you use. Also tell them if you smoke, drink alcohol, or use illegal drugs. Some items may interact with your medicine. What should I watch for while using this medicine? Visit your doctor or health care professional for regular checkups. It may be some time before you see the benefit from this medicine. Do not stop taking your medicine unless your doctor tells you to. Your doctor may order blood tests or other tests to see how you are doing. Women should inform their doctor if they wish to become pregnant or think they might be pregnant. There is a potential for serious side effects to an unborn child. Talk to your health care professional or pharmacist for more information. You should make sure that you get enough calcium and vitamin D while you are taking this medicine. Discuss the foods you eat and the vitamins you take with your health care professional. Some people who take this medicine have severe bone, joint, and/or muscle pain. This medicine may also increase your risk for jaw problems or a broken thigh bone. Tell your doctor right away if you have severe pain in your jaw, bones, joints, or muscles. Tell your doctor if you have any pain that does not go away or that gets worse. Tell your dentist and dental surgeon that you are taking this medicine. You should not have major dental surgery while on this medicine. See your dentist to have a dental exam and fix any dental problems before starting this medicine. Take good care of your teeth while on this medicine. Make sure you see your dentist for regular follow-up   appointments. What side effects may I notice from receiving this medicine? Side effects that you should report to your doctor or health care professional as soon as possible:  allergic reactions like skin rash, itching or hives, swelling of the face, lips, or tongue  anxiety, confusion, or depression  breathing problems  changes in vision  eye  pain  feeling faint or lightheaded, falls  jaw pain, especially after dental work  mouth sores  muscle cramps, stiffness, or weakness  redness, blistering, peeling or loosening of the skin, including inside the mouth  trouble passing urine or change in the amount of urine Side effects that usually do not require medical attention (report to your doctor or health care professional if they continue or are bothersome):  bone, joint, or muscle pain  constipation  diarrhea  fever  hair loss  irritation at site where injected  loss of appetite  nausea, vomiting  stomach upset  trouble sleeping  trouble swallowing  weak or tired This list may not describe all possible side effects. Call your doctor for medical advice about side effects. You may report side effects to FDA at 1-800-FDA-1088. Where should I keep my medicine? This drug is given in a hospital or clinic and will not be stored at home. NOTE: This sheet is a summary. It may not cover all possible information. If you have questions about this medicine, talk to your doctor, pharmacist, or health care provider.  2020 [employer] (2014-01-26 14:19:39)  

## 2019-08-01 LAB — KAPPA/LAMBDA LIGHT CHAINS
Kappa free light chain: 6.7 mg/L (ref 3.3–19.4)
Kappa, lambda light chain ratio: 1.6 (ref 0.26–1.65)
Lambda free light chains: 4.2 mg/L — ABNORMAL LOW (ref 5.7–26.3)

## 2019-08-03 ENCOUNTER — Ambulatory Visit: Payer: Medicare Other

## 2019-08-03 ENCOUNTER — Other Ambulatory Visit: Payer: Medicare Other

## 2019-08-06 ENCOUNTER — Telehealth: Payer: Self-pay | Admitting: *Deleted

## 2019-08-06 ENCOUNTER — Other Ambulatory Visit: Payer: Self-pay | Admitting: Medical

## 2019-08-06 MED ORDER — OXYCODONE HCL 5 MG PO TABS: 5.0000 mg | ORAL_TABLET | ORAL | 0 refills | Status: DC | PRN

## 2019-08-06 NOTE — Telephone Encounter (Signed)
Patient contacted office - requested refill of oxycodone prescribed by Dr.Kale 07/19/19. Refill request sent to V.Tanner, PA in [facility] in Dr.Kale's absence.

## 2019-08-07 ENCOUNTER — Ambulatory Visit: Payer: Medicare Other

## 2019-08-07 ENCOUNTER — Other Ambulatory Visit: Payer: Medicare Other

## 2019-08-10 ENCOUNTER — Other Ambulatory Visit: Payer: Self-pay | Admitting: Hematology

## 2019-08-14 ENCOUNTER — Ambulatory Visit: Payer: Medicare Other

## 2019-08-14 ENCOUNTER — Other Ambulatory Visit: Payer: Medicare Other

## 2019-08-14 ENCOUNTER — Ambulatory Visit: Payer: Medicare Other | Admitting: Hematology

## 2019-08-15 ENCOUNTER — Inpatient Hospital Stay: Payer: Medicare Other | Attending: Hematology | Admitting: Hematology

## 2019-08-15 ENCOUNTER — Other Ambulatory Visit: Payer: Self-pay

## 2019-08-15 ENCOUNTER — Inpatient Hospital Stay: Payer: Medicare Other

## 2019-08-15 DIAGNOSIS — R2689 Other abnormalities of gait and mobility: Secondary | ICD-10-CM | POA: Diagnosis not present

## 2019-08-15 DIAGNOSIS — T451X5A Adverse effect of antineoplastic and immunosuppressive drugs, initial encounter: Secondary | ICD-10-CM | POA: Diagnosis not present

## 2019-08-15 DIAGNOSIS — K760 Fatty (change of) liver, not elsewhere classified: Secondary | ICD-10-CM | POA: Diagnosis not present

## 2019-08-15 DIAGNOSIS — Z9221 Personal history of antineoplastic chemotherapy: Secondary | ICD-10-CM | POA: Insufficient documentation

## 2019-08-15 DIAGNOSIS — R600 Localized edema: Secondary | ICD-10-CM | POA: Diagnosis not present

## 2019-08-15 DIAGNOSIS — I480 Paroxysmal atrial fibrillation: Secondary | ICD-10-CM | POA: Insufficient documentation

## 2019-08-15 DIAGNOSIS — E7800 Pure hypercholesterolemia, unspecified: Secondary | ICD-10-CM | POA: Diagnosis not present

## 2019-08-15 DIAGNOSIS — C9000 Multiple myeloma not having achieved remission: Secondary | ICD-10-CM | POA: Diagnosis not present

## 2019-08-15 DIAGNOSIS — M1990 Unspecified osteoarthritis, unspecified site: Secondary | ICD-10-CM | POA: Insufficient documentation

## 2019-08-15 DIAGNOSIS — Z79899 Other long term (current) drug therapy: Secondary | ICD-10-CM | POA: Insufficient documentation

## 2019-08-15 DIAGNOSIS — G620 Drug-induced polyneuropathy: Secondary | ICD-10-CM | POA: Diagnosis not present

## 2019-08-15 DIAGNOSIS — R1310 Dysphagia, unspecified: Secondary | ICD-10-CM | POA: Insufficient documentation

## 2019-08-15 DIAGNOSIS — Z7189 Other specified counseling: Secondary | ICD-10-CM

## 2019-08-15 DIAGNOSIS — Z7901 Long term (current) use of anticoagulants: Secondary | ICD-10-CM | POA: Insufficient documentation

## 2019-08-15 DIAGNOSIS — I110 Hypertensive heart disease with heart failure: Secondary | ICD-10-CM | POA: Insufficient documentation

## 2019-08-15 DIAGNOSIS — Z9181 History of falling: Secondary | ICD-10-CM | POA: Diagnosis not present

## 2019-08-15 DIAGNOSIS — R0602 Shortness of breath: Secondary | ICD-10-CM | POA: Insufficient documentation

## 2019-08-15 DIAGNOSIS — C7951 Secondary malignant neoplasm of bone: Secondary | ICD-10-CM

## 2019-08-15 DIAGNOSIS — M899 Disorder of bone, unspecified: Secondary | ICD-10-CM | POA: Insufficient documentation

## 2019-08-15 DIAGNOSIS — M25559 Pain in unspecified hip: Secondary | ICD-10-CM | POA: Insufficient documentation

## 2019-08-15 LAB — CBC WITH DIFFERENTIAL/PLATELET
Abs Immature Granulocytes: 0.03 10*3/uL (ref 0.00–0.07)
Basophils Absolute: 0 10*3/uL (ref 0.0–0.1)
Basophils Relative: 1 %
Eosinophils Absolute: 0.4 10*3/uL (ref 0.0–0.5)
Eosinophils Relative: 5 %
HCT: 36.5 % — ABNORMAL LOW (ref 39.0–52.0)
Hemoglobin: 11.9 g/dL — ABNORMAL LOW (ref 13.0–17.0)
Immature Granulocytes: 0 %
Lymphocytes Relative: 23 %
Lymphs Abs: 1.6 10*3/uL (ref 0.7–4.0)
MCH: 29.8 pg (ref 26.0–34.0)
MCHC: 32.6 g/dL (ref 30.0–36.0)
MCV: 91.3 fL (ref 80.0–100.0)
Monocytes Absolute: 0.9 10*3/uL (ref 0.1–1.0)
Monocytes Relative: 12 %
Neutro Abs: 4.2 10*3/uL (ref 1.7–7.7)
Neutrophils Relative %: 59 %
Platelets: 133 10*3/uL — ABNORMAL LOW (ref 150–400)
RBC: 4 MIL/uL — ABNORMAL LOW (ref 4.22–5.81)
RDW: 13.7 % (ref 11.5–15.5)
WBC: 7.1 10*3/uL (ref 4.0–10.5)
nRBC: 0 % (ref 0.0–0.2)

## 2019-08-15 LAB — CMP (CANCER CENTER ONLY)
ALT: 22 U/L (ref 0–44)
AST: 26 U/L (ref 15–41)
Albumin: 4.4 g/dL (ref 3.5–5.0)
Alkaline Phosphatase: 66 U/L (ref 38–126)
Anion gap: 10 (ref 5–15)
BUN: 22 mg/dL (ref 8–23)
CO2: 29 mmol/L (ref 22–32)
Calcium: 8.8 mg/dL — ABNORMAL LOW (ref 8.9–10.3)
Chloride: 105 mmol/L (ref 98–111)
Creatinine: 0.87 mg/dL (ref 0.61–1.24)
GFR, Est AFR Am: >60 mL/min (ref >60)
GFR, Estimated: >60 mL/min (ref >60)
Glucose, Bld: 115 mg/dL — ABNORMAL HIGH (ref 70–99)
Potassium: 3.2 mmol/L — ABNORMAL LOW (ref 3.5–5.1)
Sodium: 144 mmol/L (ref 135–145)
Total Bilirubin: 0.7 mg/dL (ref 0.3–1.2)
Total Protein: 6.3 g/dL — ABNORMAL LOW (ref 6.5–8.1)

## 2019-08-15 MED ORDER — DULOXETINE HCL 60 MG PO CPEP: 60.0000 mg | ORAL_CAPSULE | Freq: Every day | ORAL | 1 refills | Status: DC

## 2019-08-15 MED ORDER — FENTANYL 12 MCG/HR TD PT72: 1.0000 | MEDICATED_PATCH | TRANSDERMAL | 0 refills | Status: DC

## 2019-08-15 MED ORDER — POTASSIUM CHLORIDE CRYS ER 20 MEQ PO TBCR: 40.0000 meq | EXTENDED_RELEASE_TABLET | Freq: Two times a day (BID) | ORAL | 1 refills | Status: DC

## 2019-08-15 MED ORDER — GABAPENTIN 100 MG PO CAPS: ORAL_CAPSULE | ORAL | 2 refills | Status: DC

## 2019-08-15 MED ORDER — ACYCLOVIR 400 MG PO TABS: 400.0000 mg | ORAL_TABLET | Freq: Two times a day (BID) | ORAL | 3 refills | Status: DC

## 2019-08-15 MED ORDER — OXYCODONE HCL 5 MG PO TABS: 5.0000 mg | ORAL_TABLET | ORAL | 0 refills | Status: DC | PRN

## 2019-08-15 NOTE — Progress Notes (Signed)
HEMATOLOGY/ONCOLOGY CLINIC NOTE  Date of Service: 08/15/2019  Patient Care Team: Cassandria Anger, MD as PCP - General Erline Levine, MD as Attending Physician (Neurosurgery) Larey Dresser, MD (Cardiology) Jarome Matin, MD as Consulting Physician (Dermatology) Rigoberto Noel, MD as Consulting Physician (Pulmonary Disease)   CHIEF COMPLAINTS/PURPOSE OF CONSULTATION:  Recently diagnosed Plasma cell myeloma   HISTORY OF PRESENTING ILLNESS:  [name] [name] is a wonderful 78 y.o. male who has been referred to [place] by Angelena Form, PA for evaluation and management of lytic bone lesions. The pt reports that he is doing well overall.  The pt reports that he has occasional hip pain that radiates down his legs and prevents him from walking. He uses Advil, which helps his hip pain. He is used to exercising often, but has not been able to stay very physically active lately so he is gaining weight. The pt experiences some SOB when he wakes up in the morning.  The pt had a pre-procedural CTA C/A/P before a TAVR completed on 03/14/2019 which revealed "indeterminate osseus lesions in the bony pelvis," which led to an MRI and PET scan. He reports that he has pain when he pushes on his right chest.   He also notes that he had a fall while exercising in 06/2018 and thought he broke his back. He received a blood transfusion on 07/24/2018.   Of note prior to the patient's visit today, the pt has had a MRI pelvis w/wo contrast completed on 03/28/2019 with results revealing "1. Destructive bone lesions as detailed above. Findings most consistent with metastatic disease. PET-CT may be helpful for further evaluation and to establish a primary tumor. The right pelvic bone lesions should be amenable to image guided biopsy but a PET scan may demonstrate easier/safer biopsy sites. 2. No intrapelvic mass or adenopathy. 3. Benign intraosseous lipoma involving the left anterior superior acetabulum."  The pt  has also had PET whole body completed on 04/06/2019 with results revealing "1. Diffuse osseous metastatic disease as detailed above without findings for a primary neoplasm in the chest, abdomen or pelvis. The large destructive lesion involving the right ischium should be amenable to image guided biopsy. 2. Two small retroperitoneal lymph nodes and 1 small right obturator node showing hypermetabolism."   Most recent lab results (04/06/2019) of CBC is as follows: all values are WNL.  On review of systems, pt reports hip and leg pain, weight gain and denies syncope and denies belly pain, recent neuropathy and any other symptoms.   On PMHx the pt reports 5 cm hepatic flexure polyp removal, pneumonia, blood transfusion on 07/24/2018  On Social Hx the pt reports that he lives at home with his wife. He is from [place].    INTERVAL HISTORY:  [name] [name] is a wonderful 78 y.o. male who has who is here today for evaluation and management of newly diagnosed Plasma Cell Myeloma. The patient's last visit with [place] was on 07/24/2019. The pt reports that he is doing well overall.  The pt reports that he has experiencing some pain relief with the Fentanyl Patch on initial use but it is no longer helping him as much. Pt is using Oxycodone about 1/2 of the time and is still unable to get restful sleep due to pain in his legs. His pain is concentrated below the knee. Pt also has occasional back pain but it is much less severe than his lower leg pain. He continues to have leg swelling. He is now  experiencing some loss of balance but has not fallen. Pt has continued taking his Lasix, Gabapentin, Cymbalta, and Fentanyl Patch as prescribed. He does not think that taking Gabapentin at night helps with his pain or helps him sleep. Pt has continued to take his Potassium supplement and is drinking as much water as possible. Pt has an appointment with his Cardiologist tomorrow.    Lab results today (08/15/19) of CBC w/diff and  CMP is as follows: all values are WNL except for RBC at 4.00, Hgb at 11.9, HCT at 36.5, PLTs at 133K, Potassium at 3.3, Glucose at 115, Calcium at 8.8, Total Protein at 6.3. 08/15/2019 K/L light chains is in progress 08/15/2019 MMP M spike is down to 0.2g/dl  On review of systems, pt reports b/l lower leg numbness/pain, occasional back pain, balance issues, leg swelling and denies abdominal pain and any other symptoms.    MEDICAL HISTORY:  Past Medical History:  Diagnosis Date   Acute blood loss anemia    Adenomatous colon polyp    Ascending aortic aneurysm (HCC)    Bicuspid aortic valve    Cancer (HCC)    Chest pain    ETT-myoview 12/11 w/exercise, no chest pain, no significant ST changes, EF 69%, no evidence for ischemia or infarction.   CHF NYHA class I (no symptoms from ordinary activities), acute, diastolic (HCC)    Fatty liver    mild   GI bleed    GI bleeding 07/21/2018   post polypectomy   Hemorrhoids    HTN (hypertension)    Hypercholesteremia    Hypokalemia    Internal hemorrhoids    LBP (low back pain)    Moderate aortic stenosis    Osteoarthritis    Paroxysmal atrial fibrillation ([place])    a. new onset Afib in 07/2008. He underwent ibutilide cardioversion successfully. b. Recurrence 01/2013 s/p TEE/DCCV - was on Xarelto but he stopped it as he was convinced it was causing joint pn. c. Recurrence 01/2016 - spont conv to NSR. Pt took Eliquis x1 mo then declined further anticoag. d. Recurrence 07/2016.   Pneumonia    Tubular adenoma of colon     SURGICAL HISTORY: Past Surgical History:  Procedure Laterality Date   BACK SURGERY  x12 years ago   CARDIOVERSION N/A 01/26/2013   Procedure: CARDIOVERSION;  Surgeon: Larey Dresser, MD;  Location: [facility];  Service: Cardiovascular;  Laterality: N/A;   CARDIOVERSION N/A 10/28/2017   Procedure: CARDIOVERSION;  Surgeon: Larey Dresser, MD;  Location: [facility] ENDOSCOPY;  Service: Cardiovascular;   Laterality: N/A;   CARDIOVERSION N/A 03/03/2018   Procedure: CARDIOVERSION;  Surgeon: Lelon Perla, MD;  Location: [facility] ENDOSCOPY;  Service: Cardiovascular;  Laterality: N/A;   COLONOSCOPY     COLONOSCOPY  07/17/2018   at [facility]     RIGHT/LEFT HEART CATH AND CORONARY ANGIOGRAPHY N/A 03/07/2019   Procedure: RIGHT/LEFT HEART CATH AND CORONARY ANGIOGRAPHY;  Surgeon: Burnell Blanks, MD;  Location: [facility] CV LAB;  Service: Cardiovascular;  Laterality: N/A;   TEE WITHOUT CARDIOVERSION N/A 01/26/2013   Procedure: TRANSESOPHAGEAL ECHOCARDIOGRAM (TEE);  Surgeon: Larey Dresser, MD;  Location: [facility];  Service: Cardiovascular;  Laterality: N/A;   TEE WITHOUT CARDIOVERSION N/A 10/28/2017   Procedure: TRANSESOPHAGEAL ECHOCARDIOGRAM (TEE);  Surgeon: Larey Dresser, MD;  Location: [facility] ENDOSCOPY;  Service: Cardiovascular;  Laterality: N/A;   TEE WITHOUT CARDIOVERSION N/A 05/08/2019   Procedure:  TRANSESOPHAGEAL ECHOCARDIOGRAM (TEE);  Surgeon: Burnell Blanks, MD;  Location: [facility] CV LAB;  Service: Open Heart Surgery;  Laterality: N/A;   TRANSCATHETER AORTIC VALVE REPLACEMENT, TRANSFEMORAL N/A 05/08/2019   Procedure: TRANSCATHETER AORTIC VALVE REPLACEMENT, TRANSFEMORAL;  Surgeon: Burnell Blanks, MD;  Location: [facility] CV LAB;  Service: Open Heart Surgery;  Laterality: N/A;    SOCIAL HISTORY: Social History   Socioeconomic History   Marital status: Married    Spouse name: Not on file   Number of children: 0   Years of education: Not on file   Highest education level: Not on file  Occupational History   Occupation: Retired Lobbyist: [employer] resource strain: Not on file   Food insecurity    Worry: Not on file    Inability: Not on file   Transportation needs    Medical: Not on file    Non-medical: Not on file  Tobacco Use    Smoking status: Never Smoker   Smokeless tobacco: Never Used  Substance and Sexual Activity   Alcohol use: Yes    Comment: Drinks 1 glass of wine nightly/socially   Drug use: No   Sexual activity: Yes  Lifestyle   Physical activity    Days per week: Not on file    Minutes per session: Not on file   Stress: Not on file  Relationships   Social connections    Talks on phone: Not on file    Gets together: Not on file    Attends religious service: Not on file    Active member of club or organization: Not on file    Attends meetings of clubs or organizations: Not on file    Relationship status: Not on file   Intimate partner violence    Fear of current or ex partner: Not on file    Emotionally abused: Not on file    Physically abused: Not on file    Forced sexual activity: Not on file  Other Topics Concern   Not on file  Social History Narrative   Patient lives in [place] w/ his wife. He is a native of [place]. 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   Right-handed   Caffeine: 2 cups coffee per day    FAMILY HISTORY: Family History  Problem Relation Age of Onset   Colon cancer Mother 81   Hypertension Other    Coronary artery disease Neg Hx    Colon polyps Neg Hx    Esophageal cancer Neg Hx    Rectal cancer Neg Hx    Stomach cancer Neg Hx     ALLERGIES:  is allergic to xarelto [rivaroxaban]; corticosteroids; ramipril; and benazepril.  MEDICATIONS:  Current Outpatient Medications  Medication Sig Dispense Refill   acyclovir (ZOVIRAX) 400 MG tablet Take 1 tablet (400 mg total) by mouth 2 (two) times daily. 180 tablet 3   amLODipine (NORVASC) 5 MG tablet Take 1 tablet (5 mg total) by mouth daily. 90 tablet 3   aspirin 81 MG chewable tablet Chew 1 tablet (81 mg total) by mouth daily.     dexamethasone (DECADRON) 4 MG tablet Take 3 tablets (12 mg total) by mouth once a week. 20 tablet 6   DULoxetine (CYMBALTA) 60  MG capsule Take 1 capsule (60 mg total) by mouth daily. 30 capsule 1   ELIQUIS 5 MG TABS tablet TAKE 1 TABLET BY  MOUTH TWICE A DAY 60 tablet 6   ergocalciferol (VITAMIN D2) 1.25 MG (50000 UT) capsule Take 1 capsule (50,000 Units total) by mouth once a week. 12 capsule 3   famotidine (PEPCID) 40 MG tablet Take 1 tablet (40 mg total) by mouth daily. 30 tablet 11   fentaNYL (DURAGESIC) 12 MCG/HR Place 1 patch onto the skin every 3 (three) days. 10 patch 0   furosemide (LASIX) 20 MG tablet Take 3 tablets (60 mg total) by mouth daily. 270 tablet 1   gabapentin (NEURONTIN) 100 MG capsule 200 mg po qam and '400mg'$  po qHS 180 capsule 2   lenalidomide (REVLIMID) 15 MG capsule Take 1 capsule (15 mg total) by mouth daily. Take for 14 days on, 7 days off, repeat every 21 days. 14 capsule 0   loratadine (CLARITIN) 10 MG tablet Take 1 tablet (10 mg total) by mouth daily. 30 tablet 11   LORazepam (ATIVAN) 0.5 MG tablet Take 1 tablet (0.5 mg total) by mouth every 6 (six) hours as needed (Nausea or vomiting). 30 tablet 0   metoprolol succinate (TOPROL-XL) 25 MG 24 hr tablet Take 1 tablet (25 mg total) by mouth 2 (two) times daily. 60 tablet 6   ondansetron (ZOFRAN) 8 MG tablet Take 1 tablet (8 mg total) by mouth 2 (two) times daily as needed (Nausea or vomiting). 30 tablet 1   oxyCODONE (OXY IR/ROXICODONE) 5 MG immediate release tablet Take 1-2 tablets (5-10 mg total) by mouth every 4 (four) hours as needed for moderate pain or severe pain. 120 tablet 0   potassium chloride SA (KLOR-CON M20) 20 MEQ tablet Take 2 tablets (40 mEq total) by mouth 2 (two) times daily. 120 tablet 1   prochlorperazine (COMPAZINE) 10 MG tablet Take 1 tablet (10 mg total) by mouth every 6 (six) hours as needed (Nausea or vomiting). 30 tablet 1   senna-docusate (SENNA S) 8.6-50 MG tablet Take 2 tablets by mouth at bedtime. 60 tablet 2   spironolactone (ALDACTONE) 25 MG tablet Take 1 tablet (25 mg total) by mouth daily. 90 tablet  3   No current facility-administered medications for this visit.     REVIEW OF SYSTEMS:   A 10+ POINT REVIEW OF SYSTEMS WAS OBTAINED including neurology, dermatology, psychiatry, cardiac, respiratory, lymph, extremities, GI, GU, Musculoskeletal, constitutional, breasts, reproductive, HEENT.  All pertinent positives are noted in the HPI.  All others are negative.   PHYSICAL EXAMINATION: ECOG FS:1 - Symptomatic but completely ambulatory  Vitals:   08/15/19 1005  BP: 131/70  Pulse: (!) 59  Resp: 18  Temp: 98.2 F (36.8 C)  SpO2: 100%   Wt Readings from Last 3 Encounters:  08/15/19 180 lb 6.4 oz (81.8 kg)  07/30/19 190 lb 4.8 oz (86.3 kg)  07/24/19 190 lb 4.8 oz (86.3 kg)   Body mass index is 25.16 kg/m.    Exam was given in a chair   GENERAL:alert, in no acute distress and comfortable SKIN: no acute rashes, no significant lesions EYES: conjunctiva are pink and non-injected, sclera anicteric OROPHARYNX: MMM, no exudates, no oropharyngeal erythema or ulceration NECK: supple, no JVD LYMPH:  no palpable lymphadenopathy in the cervical, axillary or inguinal regions LUNGS: clear to auscultation b/l with normal respiratory effort HEART: regular rate & rhythm ABDOMEN:  normoactive bowel sounds , non tender, not distended. No palpable hepatosplenomegaly.  Extremity: 1+ pedal edema PSYCH: alert & oriented x 3 with fluent speech NEURO: no focal motor/sensory deficits  LABORATORY DATA:  I have reviewed  the data as listed  . CBC Latest Ref Rng & Units 08/15/2019 07/31/2019 07/30/2019  WBC 4.0 - 10.5 K/uL 7.1 8.2 4.5  Hemoglobin 13.0 - 17.0 g/dL 11.9(L) 12.0(L) 11.8(L)  Hematocrit 39.0 - 52.0 % 36.5(L) 36.2(L) 35.9(L)  Platelets 150 - 400 K/uL 133(L) 190 186    . CMP Latest Ref Rng & Units 08/15/2019 07/31/2019 07/30/2019  Glucose 70 - 99 mg/dL 115(H) 116(H) 142(H)  BUN 8 - 23 mg/dL 22 26(H) 20  Creatinine 0.61 - 1.24 mg/dL 0.87 1.00 0.85  Sodium 135 - 145 mmol/L 144 142 139    Potassium 3.5 - 5.1 mmol/L 3.2(L) 3.1(L) 2.7(LL)  Chloride 98 - 111 mmol/L 105 104 103  CO2 22 - 32 mmol/L '29 23 25  '$ Calcium 8.9 - 10.3 mg/dL 8.8(L) 9.0 9.5  Total Protein 6.5 - 8.1 g/dL 6.3(L) 6.3(L) 6.5  Total Bilirubin 0.3 - 1.2 mg/dL 0.7 0.9 0.8  Alkaline Phos 38 - 126 U/L 66 77 77  AST 15 - 41 U/L '26 19 26  '$ ALT 0 - 44 U/L '22 22 25   '$ Component     Latest Ref Rng & Units 04/30/2019  Total Protein, Urine-UPE24     Not Estab. mg/dL   Total Protein, Urine-Ur/day     30 - 150 mg/24 hr   ALBUMIN, U     %   ALPHA 1 URINE     %   Alpha 2, Urine     %   % BETA, Urine     %   GAMMA GLOBULIN URINE     %   Free Kappa Lt Chains,Ur     0.63 - 113.79 mg/L   Free Lambda Lt Chains,Ur     0.47 - 11.77 mg/L   Free Kappa/Lambda Ratio     1.03 - 31.76   Immunofixation Result, Urine        Total Volume        M-SPIKE %, Urine     Not Observed %   M-Spike, mg/24 hr     Not Observed mg/24 hr   NOTE:        IgG (Immunoglobin G), Serum     603 - 1,613 mg/dL 1,740 (H)  IgA     61 - 437 mg/dL 16 (L)  IgM (Immunoglobulin M), Srm     15 - 143 mg/dL 9 (L)  Total Protein ELP     6.0 - 8.5 g/dL 7.3  Albumin SerPl Elph-Mcnc     2.9 - 4.4 g/dL 3.8  Alpha 1     0.0 - 0.4 g/dL 0.3  Alpha2 Glob SerPl Elph-Mcnc     0.4 - 1.0 g/dL 0.7  B-Globulin SerPl Elph-Mcnc     0.7 - 1.3 g/dL 0.9  Gamma Glob SerPl Elph-Mcnc     0.4 - 1.8 g/dL 1.6  M Protein SerPl Elph-Mcnc     Not Observed g/dL 1.5 (H)  Globulin, Total     2.2 - 3.9 g/dL 3.5  Albumin/Glob SerPl     0.7 - 1.7 1.1  IFE 1      Comment  Please Note (HCV):      Comment  Kappa free light chain     3.3 - 19.4 mg/L 821.2 (H)  Lamda free light chains     5.7 - 26.3 mg/L 1.9 (L)  Kappa, lamda light chain ratio     0.26 - 1.65 432.21 (H)  LDH     98 - 192 U/L 132  Sed Rate     0 - 16 mm/hr 3   05/24/2019 FISH Panel     05/24/2019 Bone Marrow Biopsy    04/16/2019 Surgical Pathology:   RADIOGRAPHIC STUDIES: I have  personally reviewed the radiological images as listed and agreed with the findings in the report. Vas [place] Lower Extremity Venous (dvt) (only [facility] & [facility])  Result Date: 07/31/2019  Lower Venous Study Indications: Pain.  Risk Factors: None identified. Comparison Study: No prior studies. Performing Technologist: Oliver Hum RVT  Examination Guidelines: A complete evaluation includes B-mode imaging, spectral Doppler, color Doppler, and power Doppler as needed of all accessible portions of each vessel. Bilateral testing is considered an integral part of a complete examination. Limited examinations for reoccurring indications may be performed as noted.  +---------+---------------+---------+-----------+----------+--------------+  RIGHT     Compressibility Phasicity Spontaneity Properties Thrombus Aging  +---------+---------------+---------+-----------+----------+--------------+  CFV       Full            Yes       Yes                                    +---------+---------------+---------+-----------+----------+--------------+  SFJ       Full                                                             +---------+---------------+---------+-----------+----------+--------------+  FV Prox   Full                                                             +---------+---------------+---------+-----------+----------+--------------+  FV Mid    Full                                                             +---------+---------------+---------+-----------+----------+--------------+  FV Distal Full                                                             +---------+---------------+---------+-----------+----------+--------------+  PFV       Full                                                             +---------+---------------+---------+-----------+----------+--------------+  POP       Full            Yes       Yes                                     +---------+---------------+---------+-----------+----------+--------------+  PTV       Full                                                             +---------+---------------+---------+-----------+----------+--------------+  PERO      Full                                                             +---------+---------------+---------+-----------+----------+--------------+   +---------+---------------+---------+-----------+----------+--------------+  LEFT      Compressibility Phasicity Spontaneity Properties Thrombus Aging  +---------+---------------+---------+-----------+----------+--------------+  CFV       Full            Yes       Yes                                    +---------+---------------+---------+-----------+----------+--------------+  SFJ       Full                                                             +---------+---------------+---------+-----------+----------+--------------+  FV Prox   Full                                                             +---------+---------------+---------+-----------+----------+--------------+  FV Mid    Full                                                             +---------+---------------+---------+-----------+----------+--------------+  FV Distal Full                                                             +---------+---------------+---------+-----------+----------+--------------+  PFV       Full                                                             +---------+---------------+---------+-----------+----------+--------------+  POP       Full            Yes       Yes                                    +---------+---------------+---------+-----------+----------+--------------+  PTV       Full                                                             +---------+---------------+---------+-----------+----------+--------------+  PERO      Full                                                              +---------+---------------+---------+-----------+----------+--------------+     Summary: Right: There is no evidence of deep vein thrombosis in the lower extremity. No cystic structure found in the popliteal fossa. Left: There is no evidence of deep vein thrombosis in the lower extremity. No cystic structure found in the popliteal fossa.  *See table(s) above for measurements and observations. Electronically signed by Deitra Mayo MD on 07/31/2019 at 1:10:08 PM.    Final     ASSESSMENT & PLAN:   78 yo   #1 Recently diagnosed Plasma cell myeloma  03/28/2019 MRI pelvis w/wo contrast revealed "1. Destructive bone lesions as detailed above. Findings most consistent with metastatic disease. PET-CT may be helpful for further evaluation and to establish a primary tumor. The right pelvic bone lesions should be amenable to image guided biopsy but a PET scan may demonstrate easier/safer biopsy sites. 2. No intrapelvic mass or adenopathy. 3. Benign intraosseous lipoma involving the left anterior superior acetabulum."  04/06/2019 PET whole body revealed "1. Diffuse osseous metastatic disease as detailed above without findings for a primary neoplasm in the chest, abdomen or pelvis. The large destructive lesion involving the right ischium should be amenable to image guided biopsy. 2. Two small retroperitoneal lymph nodes and 1 small right obturator node showing hypermetabolism."   04/16/2019 Posterior right pelvis bone biopsy revealed PLASMA CELL NEOPLASM  05/24/2019 Bone Marrow Biopsy revealed "BONE MARROW: - CELLULAR MARROW WITH INVOLVEMENT BY PLASMA CELL NEOPLASM (20%) PERIPHERAL BLOOD: - MORPHOLOGICALLY UNREMARKABLE"  05/24/2019 FISH Panel revealed "ABNORMAL result with 11q+, 14q+ and +17"  #2 Severe aortic stenosis with bicuspid aortic valve -10/26/2018 ECHO revealed AVA at 0.8 cm2 and LV EF of 60-65% -05/08/2019 pt had a Transfemoral Transcatheter Aortic Valve Replacement   PLAN: -Discussed  pt labwork today, 08/15/19; blood counts look good, blood chemistries are stead, potassium is low -M protein coming down; 0.2 g/dL from 1.5 g/dL according to 07/24/2019 MMP -Discussed 07/24/2019 K/L light chains is WNL, 07/31/2019 Lamda free light chains decreased to 4.2 mg/L -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  -Will continue to hold Revlimid until neuropathy improves  -Pt declined a referral to the [facility] -If neuropathy does not improve in 1-2 months, will refer to Neurologist  -Advised pt to take 2 Oxycodone every 4-6 hours as needed  -Continue Lasix at 20 mg per day   -Continue Zometa every 4 weeks -Continue Loratadine and Famotidine    -Continue Potassium supplement -Will increase dosage of Cymbalta from 20 mg BID to 60 mg once per day -Will increase dosage of Gabapentin from 200 mg at night to 200 mg during the day + 400 mg at night -Refill Oxycodone -Will see back in 2 weeks via phone  FOLLOW UP: Phone visit with Dr Irene Limbo in 2 weeks  The total time spent in the appt was 25 minutes and more than 50% was on counseling and direct patient cares.  All of the patient's questions were answered with apparent satisfaction. The patient knows to call the clinic with any problems, questions or concerns.   Sullivan Lone MD [place] [facility] Hematology/Oncology Physician [facility]  (Office):       [phone] (Work cell):  [phone] (Fax):           [phone]  08/15/2019 11:19 AM  I, Yevette Edwards, am acting as a scribe for Dr. Sullivan Lone.   .I have reviewed the above documentation for accuracy and completeness, and I agree with the above. Brunetta Genera MD

## 2019-08-16 ENCOUNTER — Ambulatory Visit (HOSPITAL_COMMUNITY)
Admission: RE | Admit: 2019-08-16 | Discharge: 2019-08-16 | Disposition: A | Discharge status: Home / Self Care | Payer: Medicare Other | Source: Ambulatory Visit | Attending: Cardiology | Admitting: Cardiology

## 2019-08-16 ENCOUNTER — Telehealth: Payer: Self-pay | Admitting: Hematology

## 2019-08-16 ENCOUNTER — Encounter (HOSPITAL_COMMUNITY): Payer: Self-pay | Admitting: Cardiology

## 2019-08-16 ENCOUNTER — Other Ambulatory Visit (HOSPITAL_COMMUNITY): Payer: Self-pay | Admitting: *Deleted

## 2019-08-16 ENCOUNTER — Other Ambulatory Visit (HOSPITAL_COMMUNITY)
Admission: RE | Admit: 2019-08-16 | Discharge: 2019-08-16 | Disposition: A | Discharge status: Home / Self Care | Payer: Medicare Other | Source: Ambulatory Visit | Attending: Cardiology | Admitting: Cardiology

## 2019-08-16 VITALS — BP 144/70 | HR 70 | Wt 181.8 lb

## 2019-08-16 DIAGNOSIS — E7800 Pure hypercholesterolemia, unspecified: Secondary | ICD-10-CM | POA: Diagnosis not present

## 2019-08-16 DIAGNOSIS — Z01818 Encounter for other preprocedural examination: Secondary | ICD-10-CM | POA: Diagnosis not present

## 2019-08-16 DIAGNOSIS — I10 Essential (primary) hypertension: Secondary | ICD-10-CM | POA: Insufficient documentation

## 2019-08-16 DIAGNOSIS — Z20828 Contact with and (suspected) exposure to other viral communicable diseases: Secondary | ICD-10-CM | POA: Diagnosis not present

## 2019-08-16 DIAGNOSIS — I5032 Chronic diastolic (congestive) heart failure: Secondary | ICD-10-CM

## 2019-08-16 DIAGNOSIS — Z86010 Personal history of colonic polyps: Secondary | ICD-10-CM | POA: Diagnosis not present

## 2019-08-16 DIAGNOSIS — Z888 Allergy status to other drugs, medicaments and biological substances status: Secondary | ICD-10-CM | POA: Insufficient documentation

## 2019-08-16 DIAGNOSIS — R9431 Abnormal electrocardiogram [ECG] [EKG]: Secondary | ICD-10-CM | POA: Insufficient documentation

## 2019-08-16 DIAGNOSIS — R0602 Shortness of breath: Secondary | ICD-10-CM | POA: Insufficient documentation

## 2019-08-16 DIAGNOSIS — I5033 Acute on chronic diastolic (congestive) heart failure: Secondary | ICD-10-CM

## 2019-08-16 DIAGNOSIS — G629 Polyneuropathy, unspecified: Secondary | ICD-10-CM | POA: Diagnosis not present

## 2019-08-16 DIAGNOSIS — I444 Left anterior fascicular block: Secondary | ICD-10-CM | POA: Diagnosis not present

## 2019-08-16 DIAGNOSIS — C9000 Multiple myeloma not having achieved remission: Secondary | ICD-10-CM | POA: Insufficient documentation

## 2019-08-16 DIAGNOSIS — Z79899 Other long term (current) drug therapy: Secondary | ICD-10-CM | POA: Diagnosis not present

## 2019-08-16 DIAGNOSIS — Z952 Presence of prosthetic heart valve: Secondary | ICD-10-CM | POA: Diagnosis not present

## 2019-08-16 DIAGNOSIS — Z8249 Family history of ischemic heart disease and other diseases of the circulatory system: Secondary | ICD-10-CM | POA: Insufficient documentation

## 2019-08-16 DIAGNOSIS — I2510 Atherosclerotic heart disease of native coronary artery without angina pectoris: Secondary | ICD-10-CM | POA: Diagnosis not present

## 2019-08-16 DIAGNOSIS — Z7901 Long term (current) use of anticoagulants: Secondary | ICD-10-CM | POA: Diagnosis not present

## 2019-08-16 DIAGNOSIS — I359 Nonrheumatic aortic valve disorder, unspecified: Secondary | ICD-10-CM

## 2019-08-16 DIAGNOSIS — M1990 Unspecified osteoarthritis, unspecified site: Secondary | ICD-10-CM | POA: Insufficient documentation

## 2019-08-16 DIAGNOSIS — Z953 Presence of xenogenic heart valve: Secondary | ICD-10-CM | POA: Diagnosis not present

## 2019-08-16 DIAGNOSIS — Q231 Congenital insufficiency of aortic valve: Secondary | ICD-10-CM | POA: Diagnosis not present

## 2019-08-16 DIAGNOSIS — I480 Paroxysmal atrial fibrillation: Secondary | ICD-10-CM | POA: Insufficient documentation

## 2019-08-16 DIAGNOSIS — Z800 Family history of malignant neoplasm of digestive organs: Secondary | ICD-10-CM | POA: Diagnosis not present

## 2019-08-16 DIAGNOSIS — D696 Thrombocytopenia, unspecified: Secondary | ICD-10-CM | POA: Diagnosis not present

## 2019-08-16 LAB — PROTIME-INR
INR: 1.2 (ref 0.8–1.2)
Prothrombin Time: 15.5 seconds — ABNORMAL HIGH (ref 11.4–15.2)

## 2019-08-16 LAB — BASIC METABOLIC PANEL
Anion gap: 10 (ref 5–15)
BUN: 16 mg/dL (ref 8–23)
CO2: 29 mmol/L (ref 22–32)
Calcium: 9.1 mg/dL (ref 8.9–10.3)
Chloride: 102 mmol/L (ref 98–111)
Creatinine, Ser: 0.87 mg/dL (ref 0.61–1.24)
GFR calc Af Amer: >60 mL/min (ref >60)
GFR calc non Af Amer: >60 mL/min (ref >60)
Glucose, Bld: 135 mg/dL — ABNORMAL HIGH (ref 70–99)
Potassium: 3.3 mmol/L — ABNORMAL LOW (ref 3.5–5.1)
Sodium: 141 mmol/L (ref 135–145)

## 2019-08-16 LAB — CBC
HCT: 37.2 % — ABNORMAL LOW (ref 39.0–52.0)
Hemoglobin: 11.9 g/dL — ABNORMAL LOW (ref 13.0–17.0)
MCH: 30.2 pg (ref 26.0–34.0)
MCHC: 32 g/dL (ref 30.0–36.0)
MCV: 94.4 fL (ref 80.0–100.0)
Platelets: 155 10*3/uL (ref 150–400)
RBC: 3.94 MIL/uL — ABNORMAL LOW (ref 4.22–5.81)
RDW: 13.6 % (ref 11.5–15.5)
WBC: 7.3 10*3/uL (ref 4.0–10.5)
nRBC: 0 % (ref 0.0–0.2)

## 2019-08-16 LAB — KAPPA/LAMBDA LIGHT CHAINS
Kappa free light chain: 6.1 mg/L (ref 3.3–19.4)
Kappa, lambda light chain ratio: 0.92 (ref 0.26–1.65)
Lambda free light chains: 6.6 mg/L (ref 5.7–26.3)

## 2019-08-16 MED ORDER — SODIUM CHLORIDE 0.9% FLUSH: 3.0000 mL | Freq: Two times a day (BID) | INTRAVENOUS | Status: DC

## 2019-08-16 MED ORDER — SPIRONOLACTONE 50 MG PO TABS: 50.0000 mg | ORAL_TABLET | Freq: Every day | ORAL | 3 refills | Status: DC

## 2019-08-16 NOTE — Telephone Encounter (Signed)
Scheduled appt per 12/2 los. ° °Spoke with pt and he is aware of his appt date and time. °

## 2019-08-16 NOTE — Patient Instructions (Addendum)
Increase Spironolactone to 50mg  daily.  Stop Aspirin.  Routine lab work today. Will notify you of abnormal results  Your provider has scheduled you a right heart cath for next week. (please see attached cath instruction sheet)  Repeat labs (bmet) in 2 weeks.  You have been referred to [facility], they will call you for an appointment  Follow up in 2 months.   CATH INSTRUCTIONS:  You are scheduled for a Cardiac Catheterization on Monday, December 7 with Dr. [name].  1. Please arrive at the [facility] (Main Entrance A) at [facility]: [address] [place], [place] [address] at 5:30 AM (This time is two hours before your procedure to ensure your preparation). Free valet parking service is available.   Special note: Every effort is made to have your procedure done on time. Please understand that emergencies sometimes delay scheduled procedures.  2. Diet: Do not eat solid foods after midnight.  The patient may have clear liquids until 5am upon the day of the procedure.  3. COVID TEST:  PLEASE GO FOR THIS TODAY AT [address]  4. Medication instructions in preparation for your procedure:  Stop taking Eliquis (Apixiban) on Sunday, December 6. RESTART ON Monday 12/7 PM  Stop taking, Lasix (Furosemide)  Monday, December 7, RESTART Monday 12/7 PM  YOU MAY TAKE ALL OTHER MEDICATIONS WITH SIP OF WATER ON Monday MORNING  5. Plan for one night stay--bring personal belongings. 6. Bring a current list of your medications and current insurance cards. 7. You MUST have a responsible person to drive you home. 8. Someone MUST be with you the first 24 hours after you arrive home or your discharge will be delayed. 9. Please wear clothes that are easy to get on and off and wear slip-on shoes.  Thank you for allowing [place] to care for you!   -- [facility]

## 2019-08-17 ENCOUNTER — Encounter: Payer: Self-pay | Admitting: Neurology

## 2019-08-17 LAB — MULTIPLE MYELOMA PANEL, SERUM
Albumin SerPl Elph-Mcnc: 4.1 g/dL (ref 2.9–4.4)
Albumin/Glob SerPl: 2.3 — ABNORMAL HIGH (ref 0.7–1.7)
Alpha 1: 0.2 g/dL (ref 0.0–0.4)
Alpha2 Glob SerPl Elph-Mcnc: 0.5 g/dL (ref 0.4–1.0)
B-Globulin SerPl Elph-Mcnc: 0.8 g/dL (ref 0.7–1.3)
Gamma Glob SerPl Elph-Mcnc: 0.4 g/dL (ref 0.4–1.8)
Globulin, Total: 1.8 g/dL — ABNORMAL LOW (ref 2.2–3.9)
IgA: 27 mg/dL — ABNORMAL LOW (ref 61–437)
IgG (Immunoglobin G), Serum: 444 mg/dL — ABNORMAL LOW (ref 603–1613)
IgM (Immunoglobulin M), Srm: 22 mg/dL (ref 15–143)
M Protein SerPl Elph-Mcnc: 0.2 g/dL — ABNORMAL HIGH
Total Protein ELP: 5.9 g/dL — ABNORMAL LOW (ref 6.0–8.5)

## 2019-08-17 NOTE — H&P (View-Only) (Signed)
Date:  08/17/2019  ID:  [name] [name], DOB Mar 26, 1941, MRN [id number]  Provider location: [place] Type of Visit: Established patient   PCP:  Cassandria Anger, MD  Cardiologist: Dr. Aundra Dubin  Chief Complaint: Shortness of breath   History of Present Illness: [name] [name] is a 78 y.o. male who has history of HTN, aortic stenosis and paroxysmal atrial fibrillation. He was hospitalized with atrial fibrillation/RVR in 5/14.  He had TEE-guided cardioversion.  TEE showed bicuspid aortic valve with mild AS and a moderately dilated ascending aorta.  Most echo in 4/17 showed moderate aortic stenosis and MRA chest in 11/17 showed 4.1 cm ascending aorta.   He was on Xarelto for anticoagulation but stopped it as he was convinced it was causing joint pains.  He then refused to start any other anticoagulation at that time.    In 5/17, he had been back in atrial fibrillation for several days and was symptomatic.  I started him on Eliquis and planned TEE-guided DCCV given significant symptoms, but he converted back to NSR on his own.  He continued Eliquis for about 1 month then stopped it on his own.    In 11/17, he was hospitalized with symptomatic atrial fibrillation with RVR.  I started him on diltiazem CD and Eliquis with plan for TEE-guided DCCV.  However, he converted back to NSR on his own. He stopped the diltiazem but has continued the Eliquis.   He was admitted in 2/18 with fever, LUL PNA and left-sided pleural effusion.  Thoracentesis on left was suggestive of parapneumonic effusion.  He was in the hospital 11 days.  During that time, he went into atrial fibrillation with RVR.  He was started on amiodarone and went back into NSR.  Amiodarone was subsequently stopped.   Recurrent atrial fibrillation with RVR in 2/19, felt more fatigued.  He underwent TEE-guided DCCV back to NSR.   He had a lower GI bleed in 11/19 post-polypectomy.  He has since restarted on  Eliquis.   Coronary CTA was done in 2/20, this showed mild nonobstructive CAD.  Echo in 2/20 showed EF 60-65%, bicuspid aortic valve with severe AS.   LHC in 6/20 showed no significant coronary disease.  In 8/20, he had TAVR with [place] 3 THV x 2 (valve in valve due to peri-valvular leak initially).  Post-procedure echoes have shown elevated gradient across the aortic valve though dimensionless index has only been in the mildly stenotic range.  Last echo in 9/20 showed mean aortic valve gradient 26 mmHg with dimensionless index 0.54.   Patient has additionally been diagnosed recently with multiple myeloma.  He is getting treated with Revlimid, Velcade, and prednisone.   At last appointment, he was noted to be volume overloaded and thrombocytopenic.  Eliquis and ASA were held and Lasix was started.  Toprol XL was decreased due to bradycardia.   Patient returns for followup of aortic stenosis and atrial fibrillation.  He is in NSR.  He still has significant lower extremity edema along with pain in his feet and lower legs, thought to be neuropathy.  He is unable to wear compression stockings due to the pain. Balance is poor, probably due to neuropathy. No chest pain, no exertional dyspnea thought not very active.  He continues to get chemotherapy for multiple myeloma.    ECG (personally reviewed): NSR, LAFB, LVH with repolarization  Labs (5/14): K 3.7, creatinine 0.9, BNP 2261=>109, TSH normal Labs (7/14): K 3.5,  creatinine 1.0 Labs (12/14): K 3.8, creatinine 1.0, LDL particle number 1374, LDL 107, TSH normal Labs (6/16): TSH normal, K 3.8, creatinine 0.84, HCT 42.4, LDL 85, LFTs normal Labs (3/17): K 3.8, creatinine 0.87 Labs (5/17): K 4.5, creatinine 0.97, HCT 43.7 Labs (11/17): K 3.2, creatinine 0.81, LDL 71, HDL 24 Labs (2/18): K 3.8, creatinine 0.89 Labs (2/19): K 3.7, creatinine 0.83, hgb 16 Labs (11/19): LDL 82 Labs (1/20): TSH elevated but free T4 normal, K 4, creatinine 0.85,  hgb 13.5 Labs (2/20): TSH mildly elevated but free T4 normal, K 4, creatinine 0.85 Labs (5/20): ESR 48 Labs (9/20): hgb 14.1, plts 38 => 82, K 3.3, creatinine 0.98 => 1.06 Labs (11/20): BNP 126.5 Labs (12/20): hgb 11.9, K 3.2, creatinine 0.87  Allergies (verified):  No Known Drug Allergies   Past Medical History:  1. Hypertension: ACEI cough.  2. Atrial fibrillation. The patient had new-onset atrial fibrillation in November 2009. He underwent ibutilide cardioversion successfully. He has had 1-2 episodes/year that are short-lived that likely are atrial fibrillation with RVR. He was admitted in 5/14 with atrial fibrillation/RVR and had TEE-guided DCCV.  Atrial fibrillation again in 5/17, converted back to NSR spontaneously.  CHADSVASC score 2.  - Atrial fibrillation 2/19 with TEE-guided DCCV.  3. Hypercholesterolemia.  4. Bicuspid aortic valve disorder: Echo (7/11): EF 55-60%, mild LV hypertrophy, mild aortic stenosis with mean gradient 19 mmHg and peak gradient 36 mmHg.  TEE (5/14): EF 55%, mild LVH, bicuspid aortic valve with mild AS (mean gradient 13 mmHg), ascending aorta 4.5 cm.  Echo (9/15) with EF 65-70%, moderate AS (mean gradient 23 mmHg), mild AI, ascending aorta 4.1 cm, mild MR.   - Echo (4/17) with EF 65-70%, moderate aortic stenosis with mean gradient 27 mmHg, PASP 31 mmHg, ascending aorta 4.4 cm.  - Echo (2/18) with EF 55-60%, bicuspid aortic valve with moderate aortic stenosis (underestimated gradient).  - TEE (2/19): EF 60-65%, moderate LVH, bicuspid aortic valve with moderate AS with mean gradient 28 mmHg and AVA 1.2 cm^2, 4.4 cm ascending aorta.  - Echo (2/20): EF 60-65%, mild LVH, bicuspid aortic valve with severe AS (mean gradient 48 mmHg, AVA 0.8 cm^2).  - TAVR 8/20 with valve in valve [place] 3 THVs (because of peri-valvular leak after initial valve placed).  - Echo (9/20): EF > 65%, mild LVH, mild RV dilation with normal RV systolic function, mean aortic valve  gradient 26 mmHg with dimensionless index 0.54, IVC normal.  5. Osteoarthritis.  6. Low back pain.  7. Chest pain: ETT-myoview (12/11) with 10:51 exercise, no chest pain, no significant ST changes, EF 69%, no evidence for ischemia or infarction.  - Coronary CTA (2/20): calcium score 41, nonobstructive CAD.  - LHC (6/20): No significant CAD.  8. Ascending aortic aneurysm: Associated with bicuspid aortic valve.  4.5 cm by TEE in 5/14.   MRA chest (6/14) with bicuspid aortic valve, 4.3 cm ascending aortic aneurysm.  MRA chest (10/15) with 4.2 cm ascending aorta (bicuspid aortic valve noted).  Echo (4/17) with ascending aorta diameter 4.4 cm.  - MRA chest (11/17) with 4.1 cm ascending aorta.  - Echo (2/19): 4.4 cm ascending aorta.  - Coronary CTA in 2/20 showed 4.3 cm ascending aorta.  9. Colonic polyps: GI bleeding in 11/19 s/p polypectomy.  10. Multiple myeloma.  11. Bradycardia 12. Thrombocytopenia: likely related to chemotherapy for multiple myeloma.  13. PVCs: Zio monitor in 10/20 showed 9.4% PVCs.    Current Outpatient Medications  Medication Sig Dispense  Refill  . acyclovir (ZOVIRAX) 400 MG tablet Take 1 tablet (400 mg total) by mouth 2 (two) times daily. 180 tablet 3  . amLODipine (NORVASC) 5 MG tablet Take 1 tablet (5 mg total) by mouth daily. 90 tablet 3  . dexamethasone (DECADRON) 4 MG tablet Take 3 tablets (12 mg total) by mouth once a week. (Patient taking differently: Take 20 mg by mouth every Monday. ) 20 tablet 6  . DULoxetine (CYMBALTA) 60 MG capsule Take 1 capsule (60 mg total) by mouth daily. 30 capsule 1  . ELIQUIS 5 MG TABS tablet TAKE 1 TABLET BY MOUTH TWICE A DAY (Patient taking differently: Take 5 mg by mouth 2 (two) times daily. ) 60 tablet 6  . ergocalciferol (VITAMIN D2) 1.25 MG (50000 UT) capsule Take 1 capsule (50,000 Units total) by mouth once a week. (Patient not taking: Reported on 08/17/2019) 12 capsule 3  . famotidine (PEPCID) 40 MG tablet Take 1 tablet (40 mg  total) by mouth daily. 30 tablet 11  . fentaNYL (DURAGESIC) 12 MCG/HR Place 1 patch onto the skin every 3 (three) days. 10 patch 0  . furosemide (LASIX) 20 MG tablet Take 3 tablets (60 mg total) by mouth daily. 270 tablet 1  . gabapentin (NEURONTIN) 100 MG capsule 200 mg po qam and '400mg'$  po qHS (Patient taking differently: Take 200-400 mg by mouth See admin instructions. Take 200 mg in the morning and 400 mg at bedtime) 180 capsule 2  . lenalidomide (REVLIMID) 15 MG capsule Take 1 capsule (15 mg total) by mouth daily. Take for 14 days on, 7 days off, repeat every 21 days. (Patient taking differently: Take 15 mg by mouth See admin instructions. Take 15 mg daily for 14 days on, 7 days off, repeat every 21 days.) 14 capsule 0  . loratadine (CLARITIN) 10 MG tablet Take 1 tablet (10 mg total) by mouth daily. 30 tablet 11  . LORazepam (ATIVAN) 0.5 MG tablet Take 1 tablet (0.5 mg total) by mouth every 6 (six) hours as needed (Nausea or vomiting). 30 tablet 0  . metoprolol succinate (TOPROL-XL) 25 MG 24 hr tablet Take 1 tablet (25 mg total) by mouth 2 (two) times daily. 60 tablet 6  . ondansetron (ZOFRAN) 8 MG tablet Take 1 tablet (8 mg total) by mouth 2 (two) times daily as needed (Nausea or vomiting). 30 tablet 1  . oxyCODONE (OXY IR/ROXICODONE) 5 MG immediate release tablet Take 1-2 tablets (5-10 mg total) by mouth every 4 (four) hours as needed for moderate pain or severe pain. 120 tablet 0  . potassium chloride SA (KLOR-CON M20) 20 MEQ tablet Take 2 tablets (40 mEq total) by mouth 2 (two) times daily. 120 tablet 1  . prochlorperazine (COMPAZINE) 10 MG tablet Take 1 tablet (10 mg total) by mouth every 6 (six) hours as needed (Nausea or vomiting). 30 tablet 1  . senna-docusate (SENNA S) 8.6-50 MG tablet Take 2 tablets by mouth at bedtime. (Patient taking differently: Take 2 tablets by mouth at bedtime as needed for mild constipation. ) 60 tablet 2  . spironolactone (ALDACTONE) 50 MG tablet Take 1 tablet (50  mg total) by mouth daily. 90 tablet 3  . Carboxymethylcellul-Glycerin (LUBRICATING EYE DROPS OP) Place 1 drop into both eyes daily as needed (dry eyes).    . Cholecalciferol (VITAMIN D) 50 MCG (2000 UT) tablet Take 2,000 Units by mouth daily.    . diclofenac Sodium (VOLTAREN) 1 % GEL Apply 1 application topically 4 (four) times daily  as needed (pain).     No current facility-administered medications for this encounter.     Allergies:   Xarelto [rivaroxaban], Corticosteroids, Ramipril, and Benazepril   Social History:  The patient  reports that he has never smoked. He has never used smokeless tobacco. He reports current alcohol use. He reports that he does not use drugs.   Family History:  The patient's family history includes Colon cancer (age of onset: 64) in his mother; Hypertension in an other family member.   ROS:  Please see the history of present illness.   All other systems are personally reviewed and negative.   Exam:   BP (!) 144/70   Pulse 70   Wt 82.5 kg (181 lb 12.8 oz)   SpO2 99%   BMI 25.36 kg/m  General: NAD Neck: No JVD, no thyromegaly or thyroid nodule.  Lungs: Clear to auscultation bilaterally with normal respiratory effort. CV: Nondisplaced PMI.  Heart regular S1/S2, no S3/S4, 2/6 SEM RUSB.  2/6 SEM RUSB.  2+ edema 1/3 to knees bilaterally. No carotid bruit. Normal pedal pulses.  Abdomen: Soft, nontender, no hepatosplenomegaly, no distention.  Skin: Intact without lesions or rashes.  Neurologic: Alert and oriented x 3.  Psych: Normal affect. Extremities: No clubbing or cyanosis.  HEENT: Normal.   Recent Labs: 10/03/2018: TSH 4.76 07/02/2019: Magnesium 2.3 07/30/2019: B Natriuretic Peptide 126.5 08/15/2019: ALT 22 08/16/2019: BUN 16; Creatinine, Ser 0.87; Hemoglobin 11.9; Platelets 155; Potassium 3.3; Sodium 141  Personally reviewed   Wt Readings from Last 3 Encounters:  08/16/19 82.5 kg (181 lb 12.8 oz)  08/15/19 81.8 kg (180 lb 6.4 oz)  07/30/19 86.3 kg  (190 lb 4.8 oz)      ASSESSMENT AND PLAN:  1. Atrial fibrillation: Paroxysmal. He is quite symptomatic when in atrial fibrillation.  CHADSVASC = 3.  He is in NSR today.   - Continue Eliquis.  - He has not wanted atrial fibrillation ablation.   2. Peripheral edema: Patient has significant painful lower extremity edema. He does not appear to have JVD and weight has been trending down.  I think that some of the issue here is venous insufficiency but difficult to tell how much role CHF plays.   - I will arrange for RHC to assess filling pressures to see if increased diuresis would be indicated. I do not want to increase Lasix if his cardiac filling pressures are normal. We discussed risks/benefits of the procedure and he agrees to it.  - He is unable to tolerate compression stockings due to leg pain.  3. Bradycardia: After TAVR, patient had RBBB and LAFB with HR down to the 40s.  RBBB has resolved and HR is higher.  4. PVCs: Zio monitor in 10/20 showed 9.4% PVCs.  Toprol XL was increased to 25 mg bid, he is not feeling palpitations.   5. HTN: BP elevated.     - Continue amlodipine 5 mg daily.  I do not think this is causing his peripheral edema as edema was present before he started amlodipine.   - Increase spironolactone to 50 mg daily.  6. Bicuspid aortic valve disorder: Ascending aorta was 4.3 cm on recent coronary CTA. He is now s/p TAVR with 2 [place] (valve-in-valve due to peri-valvular leak after 1st valve placed). Post-op, mean gradient across the aortic valve has been elevated, most recently 26 mmHg in 9/20.  Dimensionless index, most recently 0.54, only suggests mild bioprosthetic aortic stenosis.  I suspect the elevated gradient is due to  a combination of high flow and patient-prosthetic mismatch due to valve-in-valve placement.  7. Chronic diastolic CHF: He is now on Lasix 60 mg daily. He has significant peripheral edema but no definite JVD.  Weight has trended down.  Main  limitation seems to be leg pain/neuropathy rather than dyspnea.  - He cannot tolerate compression stockings.  - Continue Lasix 60 mg daily.    - Increase spironolactone to 50 mg daily with HTN and hypokalemia.  BMET in 10 days.   8. Multiple myeloma: He is on Revlimid, prednisone, and Velcade per Dr. Irene Limbo.  9. Peripheral neuropathy: Suspect painful peripheral neuropathy.  Having difficulty with balance.  B12 normal.  This is his main limitation at this point.  - I will make a referral to [facility] neurology for evaluation.   Followup in 2 months.   Signed, [name], MD  08/17/2019  [place] [address] [facility] [place] [address] [phone] (office) [phone] (fax)

## 2019-08-17 NOTE — Progress Notes (Signed)
Date:  08/17/2019  ID:  [name] [name], DOB Mar 26, 1941, MRN [id number]  Provider location: [facility] Type of Visit: Established patient   PCP:  Cassandria Anger, MD  Cardiologist: Dr. Aundra Dubin  Chief Complaint: Shortness of breath   History of Present Illness: [name] [name] is a 78 y.o. male who has history of HTN, aortic stenosis and paroxysmal atrial fibrillation. He was hospitalized with atrial fibrillation/RVR in 5/14.  He had TEE-guided cardioversion.  TEE showed bicuspid aortic valve with mild AS and a moderately dilated ascending aorta.  Most echo in 4/17 showed moderate aortic stenosis and MRA chest in 11/17 showed 4.1 cm ascending aorta.   He was on Xarelto for anticoagulation but stopped it as he was convinced it was causing joint pains.  He then refused to start any other anticoagulation at that time.    In 5/17, he had been back in atrial fibrillation for several days and was symptomatic.  I started him on Eliquis and planned TEE-guided DCCV given significant symptoms, but he converted back to NSR on his own.  He continued Eliquis for about 1 month then stopped it on his own.    In 11/17, he was hospitalized with symptomatic atrial fibrillation with RVR.  I started him on diltiazem CD and Eliquis with plan for TEE-guided DCCV.  However, he converted back to NSR on his own. He stopped the diltiazem but has continued the Eliquis.   He was admitted in 2/18 with fever, LUL PNA and left-sided pleural effusion.  Thoracentesis on left was suggestive of parapneumonic effusion.  He was in the hospital 11 days.  During that time, he went into atrial fibrillation with RVR.  He was started on amiodarone and went back into NSR.  Amiodarone was subsequently stopped.   Recurrent atrial fibrillation with RVR in 2/19, felt more fatigued.  He underwent TEE-guided DCCV back to NSR.   He had a lower GI bleed in 11/19 post-polypectomy.  He has since restarted on  Eliquis.   Coronary CTA was done in 2/20, this showed mild nonobstructive CAD.  Echo in 2/20 showed EF 60-65%, bicuspid aortic valve with severe AS.   LHC in 6/20 showed no significant coronary disease.  In 8/20, he had TAVR with [place] 3 THV x 2 (valve in valve due to peri-valvular leak initially).  Post-procedure echoes have shown elevated gradient across the aortic valve though dimensionless index has only been in the mildly stenotic range.  Last echo in 9/20 showed mean aortic valve gradient 26 mmHg with dimensionless index 0.54.   Patient has additionally been diagnosed recently with multiple myeloma.  He is getting treated with Revlimid, Velcade, and prednisone.   At last appointment, he was noted to be volume overloaded and thrombocytopenic.  Eliquis and ASA were held and Lasix was started.  Toprol XL was decreased due to bradycardia.   Patient returns for followup of aortic stenosis and atrial fibrillation.  He is in NSR.  He still has significant lower extremity edema along with pain in his feet and lower legs, thought to be neuropathy.  He is unable to wear compression stockings due to the pain. Balance is poor, probably due to neuropathy. No chest pain, no exertional dyspnea thought not very active.  He continues to get chemotherapy for multiple myeloma.    ECG (personally reviewed): NSR, LAFB, LVH with repolarization  Labs (5/14): K 3.7, creatinine 0.9, BNP 2261=>109, TSH normal Labs (7/14): K 3.5,  creatinine 1.0 Labs (12/14): K 3.8, creatinine 1.0, LDL particle number 1374, LDL 107, TSH normal Labs (6/16): TSH normal, K 3.8, creatinine 0.84, HCT 42.4, LDL 85, LFTs normal Labs (3/17): K 3.8, creatinine 0.87 Labs (5/17): K 4.5, creatinine 0.97, HCT 43.7 Labs (11/17): K 3.2, creatinine 0.81, LDL 71, HDL 24 Labs (2/18): K 3.8, creatinine 0.89 Labs (2/19): K 3.7, creatinine 0.83, hgb 16 Labs (11/19): LDL 82 Labs (1/20): TSH elevated but free T4 normal, K 4, creatinine 0.85,  hgb 13.5 Labs (2/20): TSH mildly elevated but free T4 normal, K 4, creatinine 0.85 Labs (5/20): ESR 48 Labs (9/20): hgb 14.1, plts 38 => 82, K 3.3, creatinine 0.98 => 1.06 Labs (11/20): BNP 126.5 Labs (12/20): hgb 11.9, K 3.2, creatinine 0.87  Allergies (verified):  No Known Drug Allergies   Past Medical History:  1. Hypertension: ACEI cough.  2. Atrial fibrillation. The patient had new-onset atrial fibrillation in November 2009. He underwent ibutilide cardioversion successfully. He has had 1-2 episodes/year that are short-lived that likely are atrial fibrillation with RVR. He was admitted in 5/14 with atrial fibrillation/RVR and had TEE-guided DCCV.  Atrial fibrillation again in 5/17, converted back to NSR spontaneously.  CHADSVASC score 2.  - Atrial fibrillation 2/19 with TEE-guided DCCV.  3. Hypercholesterolemia.  4. Bicuspid aortic valve disorder: Echo (7/11): EF 55-60%, mild LV hypertrophy, mild aortic stenosis with mean gradient 19 mmHg and peak gradient 36 mmHg.  TEE (5/14): EF 55%, mild LVH, bicuspid aortic valve with mild AS (mean gradient 13 mmHg), ascending aorta 4.5 cm.  Echo (9/15) with EF 65-70%, moderate AS (mean gradient 23 mmHg), mild AI, ascending aorta 4.1 cm, mild MR.   - Echo (4/17) with EF 65-70%, moderate aortic stenosis with mean gradient 27 mmHg, PASP 31 mmHg, ascending aorta 4.4 cm.  - Echo (2/18) with EF 55-60%, bicuspid aortic valve with moderate aortic stenosis (underestimated gradient).  - TEE (2/19): EF 60-65%, moderate LVH, bicuspid aortic valve with moderate AS with mean gradient 28 mmHg and AVA 1.2 cm^2, 4.4 cm ascending aorta.  - Echo (2/20): EF 60-65%, mild LVH, bicuspid aortic valve with severe AS (mean gradient 48 mmHg, AVA 0.8 cm^2).  - TAVR 8/20 with valve in valve [place] 3 THVs (because of peri-valvular leak after initial valve placed).  - Echo (9/20): EF > 65%, mild LVH, mild RV dilation with normal RV systolic function, mean aortic valve  gradient 26 mmHg with dimensionless index 0.54, IVC normal.  5. Osteoarthritis.  6. Low back pain.  7. Chest pain: ETT-myoview (12/11) with 10:51 exercise, no chest pain, no significant ST changes, EF 69%, no evidence for ischemia or infarction.  - Coronary CTA (2/20): calcium score 41, nonobstructive CAD.  - LHC (6/20): No significant CAD.  8. Ascending aortic aneurysm: Associated with bicuspid aortic valve.  4.5 cm by TEE in 5/14.   MRA chest (6/14) with bicuspid aortic valve, 4.3 cm ascending aortic aneurysm.  MRA chest (10/15) with 4.2 cm ascending aorta (bicuspid aortic valve noted).  Echo (4/17) with ascending aorta diameter 4.4 cm.  - MRA chest (11/17) with 4.1 cm ascending aorta.  - Echo (2/19): 4.4 cm ascending aorta.  - Coronary CTA in 2/20 showed 4.3 cm ascending aorta.  9. Colonic polyps: GI bleeding in 11/19 s/p polypectomy.  10. Multiple myeloma.  11. Bradycardia 12. Thrombocytopenia: likely related to chemotherapy for multiple myeloma.  13. PVCs: Zio monitor in 10/20 showed 9.4% PVCs.    Current Outpatient Medications  Medication Sig Dispense  Refill  . acyclovir (ZOVIRAX) 400 MG tablet Take 1 tablet (400 mg total) by mouth 2 (two) times daily. 180 tablet 3  . amLODipine (NORVASC) 5 MG tablet Take 1 tablet (5 mg total) by mouth daily. 90 tablet 3  . dexamethasone (DECADRON) 4 MG tablet Take 3 tablets (12 mg total) by mouth once a week. (Patient taking differently: Take 20 mg by mouth every Monday. ) 20 tablet 6  . DULoxetine (CYMBALTA) 60 MG capsule Take 1 capsule (60 mg total) by mouth daily. 30 capsule 1  . ELIQUIS 5 MG TABS tablet TAKE 1 TABLET BY MOUTH TWICE A DAY (Patient taking differently: Take 5 mg by mouth 2 (two) times daily. ) 60 tablet 6  . ergocalciferol (VITAMIN D2) 1.25 MG (50000 UT) capsule Take 1 capsule (50,000 Units total) by mouth once a week. (Patient not taking: Reported on 08/17/2019) 12 capsule 3  . famotidine (PEPCID) 40 MG tablet Take 1 tablet (40 mg  total) by mouth daily. 30 tablet 11  . fentaNYL (DURAGESIC) 12 MCG/HR Place 1 patch onto the skin every 3 (three) days. 10 patch 0  . furosemide (LASIX) 20 MG tablet Take 3 tablets (60 mg total) by mouth daily. 270 tablet 1  . gabapentin (NEURONTIN) 100 MG capsule 200 mg po qam and '400mg'$  po qHS (Patient taking differently: Take 200-400 mg by mouth See admin instructions. Take 200 mg in the morning and 400 mg at bedtime) 180 capsule 2  . lenalidomide (REVLIMID) 15 MG capsule Take 1 capsule (15 mg total) by mouth daily. Take for 14 days on, 7 days off, repeat every 21 days. (Patient taking differently: Take 15 mg by mouth See admin instructions. Take 15 mg daily for 14 days on, 7 days off, repeat every 21 days.) 14 capsule 0  . loratadine (CLARITIN) 10 MG tablet Take 1 tablet (10 mg total) by mouth daily. 30 tablet 11  . LORazepam (ATIVAN) 0.5 MG tablet Take 1 tablet (0.5 mg total) by mouth every 6 (six) hours as needed (Nausea or vomiting). 30 tablet 0  . metoprolol succinate (TOPROL-XL) 25 MG 24 hr tablet Take 1 tablet (25 mg total) by mouth 2 (two) times daily. 60 tablet 6  . ondansetron (ZOFRAN) 8 MG tablet Take 1 tablet (8 mg total) by mouth 2 (two) times daily as needed (Nausea or vomiting). 30 tablet 1  . oxyCODONE (OXY IR/ROXICODONE) 5 MG immediate release tablet Take 1-2 tablets (5-10 mg total) by mouth every 4 (four) hours as needed for moderate pain or severe pain. 120 tablet 0  . potassium chloride SA (KLOR-CON M20) 20 MEQ tablet Take 2 tablets (40 mEq total) by mouth 2 (two) times daily. 120 tablet 1  . prochlorperazine (COMPAZINE) 10 MG tablet Take 1 tablet (10 mg total) by mouth every 6 (six) hours as needed (Nausea or vomiting). 30 tablet 1  . senna-docusate (SENNA S) 8.6-50 MG tablet Take 2 tablets by mouth at bedtime. (Patient taking differently: Take 2 tablets by mouth at bedtime as needed for mild constipation. ) 60 tablet 2  . spironolactone (ALDACTONE) 50 MG tablet Take 1 tablet (50  mg total) by mouth daily. 90 tablet 3  . Carboxymethylcellul-Glycerin (LUBRICATING EYE DROPS OP) Place 1 drop into both eyes daily as needed (dry eyes).    . Cholecalciferol (VITAMIN D) 50 MCG (2000 UT) tablet Take 2,000 Units by mouth daily.    . diclofenac Sodium (VOLTAREN) 1 % GEL Apply 1 application topically 4 (four) times daily  as needed (pain).     No current facility-administered medications for this encounter.     Allergies:   Xarelto [rivaroxaban], Corticosteroids, Ramipril, and Benazepril   Social History:  The patient  reports that he has never smoked. He has never used smokeless tobacco. He reports current alcohol use. He reports that he does not use drugs.   Family History:  The patient's family history includes Colon cancer (age of onset: 100) in his mother; Hypertension in an other family member.   ROS:  Please see the history of present illness.   All other systems are personally reviewed and negative.   Exam:   BP (!) 144/70   Pulse 70   Wt 82.5 kg (181 lb 12.8 oz)   SpO2 99%   BMI 25.36 kg/m  General: NAD Neck: No JVD, no thyromegaly or thyroid nodule.  Lungs: Clear to auscultation bilaterally with normal respiratory effort. CV: Nondisplaced PMI.  Heart regular S1/S2, no S3/S4, 2/6 SEM RUSB.  2/6 SEM RUSB.  2+ edema 1/3 to knees bilaterally. No carotid bruit. Normal pedal pulses.  Abdomen: Soft, nontender, no hepatosplenomegaly, no distention.  Skin: Intact without lesions or rashes.  Neurologic: Alert and oriented x 3.  Psych: Normal affect. Extremities: No clubbing or cyanosis.  HEENT: Normal.   Recent Labs: 10/03/2018: TSH 4.76 07/02/2019: Magnesium 2.3 07/30/2019: B Natriuretic Peptide 126.5 08/15/2019: ALT 22 08/16/2019: BUN 16; Creatinine, Ser 0.87; Hemoglobin 11.9; Platelets 155; Potassium 3.3; Sodium 141  Personally reviewed   Wt Readings from Last 3 Encounters:  08/16/19 82.5 kg (181 lb 12.8 oz)  08/15/19 81.8 kg (180 lb 6.4 oz)  07/30/19 86.3 kg  (190 lb 4.8 oz)      ASSESSMENT AND PLAN:  1. Atrial fibrillation: Paroxysmal. He is quite symptomatic when in atrial fibrillation.  CHADSVASC = 3.  He is in NSR today.   - Continue Eliquis.  - He has not wanted atrial fibrillation ablation.   2. Peripheral edema: Patient has significant painful lower extremity edema. He does not appear to have JVD and weight has been trending down.  I think that some of the issue here is venous insufficiency but difficult to tell how much role CHF plays.   - I will arrange for RHC to assess filling pressures to see if increased diuresis would be indicated. I do not want to increase Lasix if his cardiac filling pressures are normal. We discussed risks/benefits of the procedure and he agrees to it.  - He is unable to tolerate compression stockings due to leg pain.  3. Bradycardia: After TAVR, patient had RBBB and LAFB with HR down to the 40s.  RBBB has resolved and HR is higher.  4. PVCs: Zio monitor in 10/20 showed 9.4% PVCs.  Toprol XL was increased to 25 mg bid, he is not feeling palpitations.   5. HTN: BP elevated.     - Continue amlodipine 5 mg daily.  I do not think this is causing his peripheral edema as edema was present before he started amlodipine.   - Increase spironolactone to 50 mg daily.  6. Bicuspid aortic valve disorder: Ascending aorta was 4.3 cm on recent coronary CTA. He is now s/p TAVR with 2 [place] (valve-in-valve due to peri-valvular leak after 1st valve placed). Post-op, mean gradient across the aortic valve has been elevated, most recently 26 mmHg in 9/20.  Dimensionless index, most recently 0.54, only suggests mild bioprosthetic aortic stenosis.  I suspect the elevated gradient is due to  a combination of high flow and patient-prosthetic mismatch due to valve-in-valve placement.  7. Chronic diastolic CHF: He is now on Lasix 60 mg daily. He has significant peripheral edema but no definite JVD.  Weight has trended down.  Main  limitation seems to be leg pain/neuropathy rather than dyspnea.  - He cannot tolerate compression stockings.  - Continue Lasix 60 mg daily.    - Increase spironolactone to 50 mg daily with HTN and hypokalemia.  BMET in 10 days.   8. Multiple myeloma: He is on Revlimid, prednisone, and Velcade per Dr. Irene Limbo.  9. Peripheral neuropathy: Suspect painful peripheral neuropathy.  Having difficulty with balance.  B12 normal.  This is his main limitation at this point.  - I will make a referral to [facility] neurology for evaluation.   Followup in 2 months.   Signed, [name], MD  08/17/2019  [place] [address] [facility] [place] [address] [phone] (office) [phone] (fax)

## 2019-08-19 LAB — NOVEL CORONAVIRUS, NAA (HOSP ORDER, SEND-OUT TO REF LAB; TAT 18-24 HRS): SARS-CoV-2, NAA: NOT DETECTED

## 2019-08-20 ENCOUNTER — Encounter (HOSPITAL_COMMUNITY)
Admission: RE | Disposition: A | Discharge status: Home / Self Care | Payer: Self-pay | Source: Home / Self Care | Attending: Cardiology

## 2019-08-20 ENCOUNTER — Ambulatory Visit (HOSPITAL_COMMUNITY)
Admission: RE | Admit: 2019-08-20 | Discharge: 2019-08-20 | Disposition: A | Discharge status: Home / Self Care | Payer: Medicare Other | Attending: Cardiology | Admitting: Cardiology

## 2019-08-20 ENCOUNTER — Other Ambulatory Visit: Payer: Self-pay

## 2019-08-20 ENCOUNTER — Encounter (HOSPITAL_COMMUNITY): Payer: Self-pay | Admitting: Cardiology

## 2019-08-20 DIAGNOSIS — I5032 Chronic diastolic (congestive) heart failure: Secondary | ICD-10-CM

## 2019-08-20 DIAGNOSIS — I509 Heart failure, unspecified: Secondary | ICD-10-CM | POA: Diagnosis not present

## 2019-08-20 DIAGNOSIS — M1990 Unspecified osteoarthritis, unspecified site: Secondary | ICD-10-CM | POA: Diagnosis not present

## 2019-08-20 DIAGNOSIS — Q231 Congenital insufficiency of aortic valve: Secondary | ICD-10-CM | POA: Insufficient documentation

## 2019-08-20 DIAGNOSIS — I110 Hypertensive heart disease with heart failure: Secondary | ICD-10-CM | POA: Diagnosis not present

## 2019-08-20 DIAGNOSIS — E7800 Pure hypercholesterolemia, unspecified: Secondary | ICD-10-CM | POA: Insufficient documentation

## 2019-08-20 DIAGNOSIS — Z79899 Other long term (current) drug therapy: Secondary | ICD-10-CM | POA: Diagnosis not present

## 2019-08-20 DIAGNOSIS — Z7901 Long term (current) use of anticoagulants: Secondary | ICD-10-CM | POA: Insufficient documentation

## 2019-08-20 DIAGNOSIS — Z952 Presence of prosthetic heart valve: Secondary | ICD-10-CM | POA: Diagnosis not present

## 2019-08-20 DIAGNOSIS — G629 Polyneuropathy, unspecified: Secondary | ICD-10-CM | POA: Insufficient documentation

## 2019-08-20 DIAGNOSIS — Z8249 Family history of ischemic heart disease and other diseases of the circulatory system: Secondary | ICD-10-CM | POA: Diagnosis not present

## 2019-08-20 DIAGNOSIS — I480 Paroxysmal atrial fibrillation: Secondary | ICD-10-CM | POA: Diagnosis not present

## 2019-08-20 DIAGNOSIS — Z791 Long term (current) use of non-steroidal anti-inflammatories (NSAID): Secondary | ICD-10-CM | POA: Diagnosis not present

## 2019-08-20 DIAGNOSIS — C9000 Multiple myeloma not having achieved remission: Secondary | ICD-10-CM | POA: Diagnosis not present

## 2019-08-20 DIAGNOSIS — E876 Hypokalemia: Secondary | ICD-10-CM | POA: Insufficient documentation

## 2019-08-20 DIAGNOSIS — R609 Edema, unspecified: Secondary | ICD-10-CM | POA: Diagnosis not present

## 2019-08-20 HISTORY — PX: RIGHT HEART CATH: CATH118263

## 2019-08-20 LAB — POCT I-STAT EG7
Acid-Base Excess: 1 mmol/L (ref 0.0–2.0)
Acid-Base Excess: 2 mmol/L (ref 0.0–2.0)
Bicarbonate: 25.9 mmol/L (ref 20.0–28.0)
Bicarbonate: 26.6 mmol/L (ref 20.0–28.0)
Calcium, Ion: 1.14 mmol/L — ABNORMAL LOW (ref 1.15–1.40)
Calcium, Ion: 1.18 mmol/L (ref 1.15–1.40)
HCT: 33 % — ABNORMAL LOW (ref 39.0–52.0)
HCT: 33 % — ABNORMAL LOW (ref 39.0–52.0)
Hemoglobin: 11.2 g/dL — ABNORMAL LOW (ref 13.0–17.0)
Hemoglobin: 11.2 g/dL — ABNORMAL LOW (ref 13.0–17.0)
O2 Saturation: 68 %
O2 Saturation: 72 %
Potassium: 4 mmol/L (ref 3.5–5.1)
Potassium: 4.1 mmol/L (ref 3.5–5.1)
Sodium: 140 mmol/L (ref 135–145)
Sodium: 141 mmol/L (ref 135–145)
TCO2: 27 mmol/L (ref 22–32)
TCO2: 28 mmol/L (ref 22–32)
pCO2, Ven: 41.2 mmHg — ABNORMAL LOW (ref 44.0–60.0)
pCO2, Ven: 42.5 mmHg — ABNORMAL LOW (ref 44.0–60.0)
pH, Ven: 7.405 (ref 7.250–7.430)
pH, Ven: 7.405 (ref 7.250–7.430)
pO2, Ven: 35 mmHg (ref 32.0–45.0)
pO2, Ven: 38 mmHg (ref 32.0–45.0)

## 2019-08-20 LAB — POTASSIUM: Potassium: 4.1 mmol/L (ref 3.5–5.1)

## 2019-08-20 SURGERY — RIGHT HEART CATH
Anesthesia: LOCAL

## 2019-08-20 MED ORDER — ACETAMINOPHEN 325 MG PO TABS: 650.0000 mg | ORAL_TABLET | ORAL | Status: DC | PRN

## 2019-08-20 MED ORDER — SODIUM CHLORIDE 0.9 % IV SOLN
INTRAVENOUS | Status: DC
  Administered 2019-08-20: 06:00:00 via INTRAVENOUS

## 2019-08-20 MED ORDER — ASPIRIN 81 MG PO CHEW
CHEWABLE_TABLET | ORAL | Status: AC
  Filled 2019-08-20: qty 1

## 2019-08-20 MED ORDER — SODIUM CHLORIDE 0.9% FLUSH: 3.0000 mL | Freq: Two times a day (BID) | INTRAVENOUS | Status: DC

## 2019-08-20 MED ORDER — HEPARIN (PORCINE) IN NACL 1000-0.9 UT/500ML-% IV SOLN
INTRAVENOUS | Status: DC | PRN
  Administered 2019-08-20: 500 mL

## 2019-08-20 MED ORDER — SODIUM CHLORIDE 0.9 % IV SOLN: 250.0000 mL | INTRAVENOUS | Status: DC | PRN

## 2019-08-20 MED ORDER — HYDRALAZINE HCL 20 MG/ML IJ SOLN: 10.0000 mg | INTRAMUSCULAR | Status: DC | PRN

## 2019-08-20 MED ORDER — LABETALOL HCL 5 MG/ML IV SOLN: 10.0000 mg | INTRAVENOUS | Status: DC | PRN

## 2019-08-20 MED ORDER — SODIUM CHLORIDE 0.9% FLUSH: 3.0000 mL | INTRAVENOUS | Status: DC | PRN

## 2019-08-20 MED ORDER — HEPARIN (PORCINE) IN NACL 1000-0.9 UT/500ML-% IV SOLN
INTRAVENOUS | Status: AC
  Filled 2019-08-20: qty 500

## 2019-08-20 MED ORDER — LIDOCAINE HCL (PF) 1 % IJ SOLN
INTRAMUSCULAR | Status: AC
  Filled 2019-08-20: qty 30

## 2019-08-20 MED ORDER — ASPIRIN 81 MG PO CHEW
81.0000 mg | CHEWABLE_TABLET | ORAL | Status: AC
  Administered 2019-08-20: 81 mg via ORAL

## 2019-08-20 MED ORDER — LIDOCAINE HCL (PF) 1 % IJ SOLN
INTRAMUSCULAR | Status: DC | PRN
  Administered 2019-08-20: 2 mL

## 2019-08-20 MED ORDER — ONDANSETRON HCL 4 MG/2ML IJ SOLN: 4.0000 mg | Freq: Four times a day (QID) | INTRAMUSCULAR | Status: DC | PRN

## 2019-08-20 SURGICAL SUPPLY — 7 items
CATH BALLN WEDGE 5F 110CM (CATHETERS) ×1 IMPLANT
PACK CARDIAC CATHETERIZATION (CUSTOM PROCEDURE TRAY) ×2 IMPLANT
SHEATH GLIDE SLENDER 4/5FR (SHEATH) ×1 IMPLANT
TRANSDUCER W/STOPCOCK (MISCELLANEOUS) ×2 IMPLANT
TUBING ART PRESS 72  MALE/FEM (TUBING) ×1
TUBING ART PRESS 72 MALE/FEM (TUBING) IMPLANT
WIRE EMERALD 3MM-J .025X260CM (WIRE) ×1 IMPLANT

## 2019-08-20 NOTE — Interval H&P Note (Signed)
History and Physical Interval Note:  08/20/2019 7:39 AM  [name] [name]  has presented today for surgery, with the diagnosis of heart failure.  The various methods of treatment have been discussed with the patient and family. After consideration of risks, benefits and other options for treatment, the patient has consented to  Procedure(s): RIGHT HEART CATH (N/A) as a surgical intervention.  The patient's history has been reviewed, patient examined, no change in status, stable for surgery.  I have reviewed the patient's chart and labs.  Questions were answered to the patient's satisfaction.     [name] [name] [employer]

## 2019-08-20 NOTE — Progress Notes (Signed)
Discharge instructions reviewed with patient and family. Verbalized understanding. 

## 2019-08-20 NOTE — Discharge Instructions (Signed)
Venogram, Care After This sheet gives you information about how to care for yourself after your procedure. Your health care provider may also give you more specific instructions. If you have problems or questions, contact your health care provider. What can I expect after the procedure? After the procedure, it is common to have:  Bruising or mild discomfort in the area where the IV was inserted (insertion site). Follow these instructions at home: Eating and drinking   Follow instructions from your health care provider about eating or drinking restrictions.  Drink a lot of fluids for the first several days after the procedure, as directed by your health care provider. This helps to wash (flush) the contrast out of your body. Examples of healthy fluids include water or low-calorie drinks. General instructions  Check your IV insertion area every day for signs of infection. Check for: ? Redness, swelling, or pain. ? Fluid or blood. ? Warmth. ? Pus or a bad smell.  Take over-the-counter and prescription medicines only as told by your health care provider.  Rest and return to your normal activities as told by your health care provider. Ask your health care provider what activities are safe for you.  Do not drive for 24 hours if you were given a medicine to help you relax (sedative), or until your health care provider approves.  Keep all follow-up visits as told by your health care provider. This is important. Contact a health care provider if:  Your skin becomes itchy or you develop a rash or hives.  You have a fever that does not get better with medicine.  You feel nauseous.  You vomit.  You have redness, swelling, or pain around the insertion site.  You have fluid or blood coming from the insertion site.  Your insertion area feels warm to the touch.  You have pus or a bad smell coming from the insertion site. Get help right away if:  You have difficulty breathing or  shortness of breath.  You develop chest pain.  You faint.  You feel very dizzy. These symptoms may represent a serious problem that is an emergency. Do not wait to see if the symptoms will go away. Get medical help right away. Call your local emergency services (911 in the [place] Do not drive yourself to the hospital. Summary  After your procedure, it is common to have bruising or mild discomfort in the area where the IV was inserted.  You should check your IV insertion area every day for signs of infection.  Take over-the-counter and prescription medicines only as told by your health care provider.  You should drink a lot of fluids for the first several days after the procedure to help flush the contrast from your body. This information is not intended to replace advice given to you by your health care provider. Make sure you discuss any questions you have with your health care provider. Document Released: 06/20/2013 Document Revised: 08/12/2017 Document Reviewed: 07/24/2016 Elsevier Patient Education  [place] this evening.

## 2019-08-21 ENCOUNTER — Other Ambulatory Visit: Payer: Medicare Other

## 2019-08-21 ENCOUNTER — Ambulatory Visit: Payer: Medicare Other

## 2019-08-22 ENCOUNTER — Telehealth (HOSPITAL_COMMUNITY): Payer: Self-pay

## 2019-08-22 NOTE — Telephone Encounter (Signed)
Returned call to patient.  He stated at the end of his cath, the doctor told him to stop one of his medications but he is uncertain which one.  He thinks it was the lasix.  No written instructions given per patient.  Please verify if patient was to stop one of his medications, if so which one? Routed to MD

## 2019-08-22 NOTE — Telephone Encounter (Signed)
Nothing to stop.  I just told him that we were not going to increase his Lasix

## 2019-08-22 NOTE — Telephone Encounter (Signed)
Pt advised of same, to continue all meds as instructed. Verbalized appreciation.

## 2019-08-28 ENCOUNTER — Other Ambulatory Visit: Payer: Self-pay

## 2019-08-28 ENCOUNTER — Inpatient Hospital Stay: Payer: Medicare Other

## 2019-08-28 ENCOUNTER — Telehealth (HOSPITAL_COMMUNITY): Payer: Self-pay

## 2019-08-28 DIAGNOSIS — C9000 Multiple myeloma not having achieved remission: Secondary | ICD-10-CM

## 2019-08-28 DIAGNOSIS — I5032 Chronic diastolic (congestive) heart failure: Secondary | ICD-10-CM

## 2019-08-28 LAB — CBC WITH DIFFERENTIAL/PLATELET
Abs Immature Granulocytes: 0.03 10*3/uL (ref 0.00–0.07)
Basophils Absolute: 0 10*3/uL (ref 0.0–0.1)
Basophils Relative: 1 %
Eosinophils Absolute: 0.4 10*3/uL (ref 0.0–0.5)
Eosinophils Relative: 6 %
HCT: 42.5 % (ref 39.0–52.0)
Hemoglobin: 13.9 g/dL (ref 13.0–17.0)
Immature Granulocytes: 0 %
Lymphocytes Relative: 17 %
Lymphs Abs: 1.2 10*3/uL (ref 0.7–4.0)
MCH: 30.1 pg (ref 26.0–34.0)
MCHC: 32.7 g/dL (ref 30.0–36.0)
MCV: 92 fL (ref 80.0–100.0)
Monocytes Absolute: 0.7 10*3/uL (ref 0.1–1.0)
Monocytes Relative: 10 %
Neutro Abs: 4.8 10*3/uL (ref 1.7–7.7)
Neutrophils Relative %: 66 %
Platelets: 151 10*3/uL (ref 150–400)
RBC: 4.62 MIL/uL (ref 4.22–5.81)
RDW: 13.1 % (ref 11.5–15.5)
WBC: 7.2 10*3/uL (ref 4.0–10.5)
nRBC: 0 % (ref 0.0–0.2)

## 2019-08-28 LAB — CMP (CANCER CENTER ONLY)
ALT: 18 U/L (ref 0–44)
AST: 19 U/L (ref 15–41)
Albumin: 4.6 g/dL (ref 3.5–5.0)
Alkaline Phosphatase: 68 U/L (ref 38–126)
Anion gap: 14 (ref 5–15)
BUN: 36 mg/dL — ABNORMAL HIGH (ref 8–23)
CO2: 22 mmol/L (ref 22–32)
Calcium: 9.3 mg/dL (ref 8.9–10.3)
Chloride: 103 mmol/L (ref 98–111)
Creatinine: 1.52 mg/dL — ABNORMAL HIGH (ref 0.61–1.24)
GFR, Est AFR Am: 50 mL/min — ABNORMAL LOW (ref >60)
GFR, Estimated: 43 mL/min — ABNORMAL LOW (ref >60)
Glucose, Bld: 122 mg/dL — ABNORMAL HIGH (ref 70–99)
Potassium: 4.5 mmol/L (ref 3.5–5.1)
Sodium: 139 mmol/L (ref 135–145)
Total Bilirubin: 0.6 mg/dL (ref 0.3–1.2)
Total Protein: 6.7 g/dL (ref 6.5–8.1)

## 2019-08-28 MED ORDER — FUROSEMIDE 20 MG PO TABS: ORAL_TABLET | ORAL | 1 refills | Status: DC

## 2019-08-28 NOTE — Progress Notes (Signed)
Per Dr. Irene Limbo, Hold Zometa today due to increase in creatinine.  MD recommendations for patient to consult with cardiologist regarding diuretics and increase in creatinine.  RN educated patient on this, will forward note to cardiologist as well.

## 2019-08-28 NOTE — Telephone Encounter (Signed)
Per Dr Aundra Dubin, pt to hold Lasix for one day then, restart 40mg  daily.  Pt however notes that he has been taking 40mg  daily for 4-5 days because he has been voiding often.  D/w Dr Aundra Dubin, he is to alternate 40mg  daily and 20mg  every other day.  Will rtc in 1 week for bmet. Pt verbalized understanding.

## 2019-08-28 NOTE — Telephone Encounter (Signed)
-----   Message from Larey Dresser, MD sent at 08/28/2019 10:23 AM EST -----   ----- Message ----- From: Rennis Harding, RN Sent: 08/28/2019  10:12 AM EST To: Larey Dresser, MD

## 2019-08-28 NOTE — Patient Instructions (Signed)

## 2019-08-28 NOTE — Progress Notes (Signed)
Please ask [name] [name] to stop Lasix for a day then decrease to 40 mg daily after that.  BMET in 1 week.

## 2019-08-30 ENCOUNTER — Other Ambulatory Visit (HOSPITAL_COMMUNITY): Payer: Medicare Other

## 2019-08-31 ENCOUNTER — Telehealth: Payer: Self-pay | Admitting: Hematology

## 2019-08-31 ENCOUNTER — Telehealth: Payer: Self-pay | Admitting: *Deleted

## 2019-08-31 NOTE — Telephone Encounter (Signed)
Patient called - he is scheduled for phone appt on Monday 12/21 at 2:40 pm. He wants to come to office and see Dr.Kale. States he wants to see him in person. Appointment note changed to "In office"

## 2019-09-03 ENCOUNTER — Other Ambulatory Visit: Payer: Self-pay

## 2019-09-03 ENCOUNTER — Inpatient Hospital Stay (HOSPITAL_BASED_OUTPATIENT_CLINIC_OR_DEPARTMENT_OTHER): Payer: Medicare Other | Admitting: Hematology

## 2019-09-03 VITALS — BP 149/87 | HR 78 | Temp 98.5°F | Resp 16 | Ht 71.0 in | Wt 163.4 lb

## 2019-09-03 DIAGNOSIS — R1312 Dysphagia, oropharyngeal phase: Secondary | ICD-10-CM

## 2019-09-03 DIAGNOSIS — G629 Polyneuropathy, unspecified: Secondary | ICD-10-CM

## 2019-09-03 DIAGNOSIS — C9000 Multiple myeloma not having achieved remission: Secondary | ICD-10-CM | POA: Diagnosis not present

## 2019-09-03 NOTE — Progress Notes (Signed)
HEMATOLOGY/ONCOLOGY CLINIC NOTE  Date of Service: 09/03/2019  Patient Care Team: Cassandria Anger, MD as PCP - General Erline Levine, MD as Attending Physician (Neurosurgery) Larey Dresser, MD (Cardiology) Jarome Matin, MD as Consulting Physician (Dermatology) Rigoberto Noel, MD as Consulting Physician (Pulmonary Disease)   CHIEF COMPLAINTS/PURPOSE OF CONSULTATION:  Recently diagnosed Plasma cell myeloma   HISTORY OF PRESENTING ILLNESS:  [name] [name] is a wonderful 78 y.o. male who has been referred to [place] by Angelena Form, PA for evaluation and management of lytic bone lesions. The pt reports that he is doing well overall.  The pt reports that he has occasional hip pain that radiates down his legs and prevents him from walking. He uses Advil, which helps his hip pain. He is used to exercising often, but has not been able to stay very physically active lately so he is gaining weight. The pt experiences some SOB when he wakes up in the morning.  The pt had a pre-procedural CTA C/A/P before a TAVR completed on 03/14/2019 which revealed "indeterminate osseus lesions in the bony pelvis," which led to an MRI and PET scan. He reports that he has pain when he pushes on his right chest.   He also notes that he had a fall while exercising in 06/2018 and thought he broke his back. He received a blood transfusion on 07/24/2018.   Of note prior to the patient's visit today, the pt has had a MRI pelvis w/wo contrast completed on 03/28/2019 with results revealing "1. Destructive bone lesions as detailed above. Findings most consistent with metastatic disease. PET-CT may be helpful for further evaluation and to establish a primary tumor. The right pelvic bone lesions should be amenable to image guided biopsy but a PET scan may demonstrate easier/safer biopsy sites. 2. No intrapelvic mass or adenopathy. 3. Benign intraosseous lipoma involving the left anterior superior acetabulum."  The pt  has also had PET whole body completed on 04/06/2019 with results revealing "1. Diffuse osseous metastatic disease as detailed above without findings for a primary neoplasm in the chest, abdomen or pelvis. The large destructive lesion involving the right ischium should be amenable to image guided biopsy. 2. Two small retroperitoneal lymph nodes and 1 small right obturator node showing hypermetabolism."   Most recent lab results (04/06/2019) of CBC is as follows: all values are WNL.  On review of systems, pt reports hip and leg pain, weight gain and denies syncope and denies belly pain, recent neuropathy and any other symptoms.   On PMHx the pt reports 5 cm hepatic flexure polyp removal, pneumonia, blood transfusion on 07/24/2018  On Social Hx the pt reports that he lives at home with his wife. He is from [place].    INTERVAL HISTORY:  [name] [name] is a wonderful 78 y.o. male who has who is here today for evaluation and management of newly diagnosed Plasma Cell Myeloma. The patient's last visit with [place] was on 08/15/2019. The pt reports that he is doing well overall.  The pt reports that his leg swelling has improved but he is still experiencing pain from his knees down. He is also having a cold/needle sensation in his feet, more towards the toes. He has had some trouble holding and grabbing objects with his fingers due to the lack of feeling. Pt has been taking Cymbalta and Gabapentin as prescribed and Oxycodone as needed. After our last visit Oxycodone was able to help him sleep for a few nights but it is  not continuing to provide the same level of relief. He is also having some upset stomach. Pt has been having difficulty swallowing which is preventing him from eating properly. Even with the help of liquids he is having a hard time swallowing. He feels as though his throat won't open to allow food in. He is interested in a swallow study to determine the cause. He has not had a visit with Dr.  Alain Marion to discuss his symptoms lately. Pt notes that in the last few days he has noticed that he has been SOB when he stands up. Pt has an upcoming appointment with Dr. Posey Pronto, Neurologist, on 12/28.   Lab results (08/28/19) of CBC w/diff and CMP is as follows: all values are WNL except for Glucose at 122, BUN at 36, Creatinine at 1.52, GFR Est Non Af Am at 43.  On review of systems, pt reports leg/foot/finger numbness/tingling/pain, low spine pain, issues swallowing, SOB, upset stomach, sleeplessness and denies leg swelling, abdominal pain and any other symptoms.    MEDICAL HISTORY:  Past Medical History:  Diagnosis Date  . Acute blood loss anemia   . Adenomatous colon polyp   . Ascending aortic aneurysm ([place])   . Bicuspid aortic valve   . Cancer ([place])   . Chest pain    ETT-myoview 12/11 w/exercise, no chest pain, no significant ST changes, EF 69%, no evidence for ischemia or infarction.  . CHF NYHA class I (no symptoms from ordinary activities), acute, diastolic ([place])   . Fatty liver    mild  . GI bleed   . GI bleeding 07/21/2018   post polypectomy  . Hemorrhoids   . HTN (hypertension)   . Hypercholesteremia   . Hypokalemia   . Internal hemorrhoids   . LBP (low back pain)   . Moderate aortic stenosis   . Osteoarthritis   . Paroxysmal atrial fibrillation ([place])    a. new onset Afib in 07/2008. He underwent ibutilide cardioversion successfully. b. Recurrence 01/2013 s/p TEE/DCCV - was on Xarelto but he stopped it as he was convinced it was causing joint pn. c. Recurrence 01/2016 - spont conv to NSR. Pt took Eliquis x1 mo then declined further anticoag. d. Recurrence 07/2016.  [name] Pneumonia   . Tubular adenoma of colon     SURGICAL HISTORY: Past Surgical History:  Procedure Laterality Date  . BACK SURGERY  x12 years ago  . CARDIOVERSION N/A 01/26/2013   Procedure: CARDIOVERSION;  Surgeon: Larey Dresser, MD;  Location: [facility] ENDOSCOPY;  Service: Cardiovascular;  Laterality: N/A;    . CARDIOVERSION N/A 10/28/2017   Procedure: CARDIOVERSION;  Surgeon: Larey Dresser, MD;  Location[facility] ENDOSCOPY;  Service: Cardiovascular;  Laterality: N/A;  . CARDIOVERSION N/A 03/03/2018   Procedure: CARDIOVERSION;  Surgeon: Lelon Perla, MD;  Location: [facility] ENDOSCOPY;  Service: Cardiovascular;  Laterality: N/A;  . COLONOSCOPY    . COLONOSCOPY  07/17/2018   at [facility]  . HEMORRHOID SURGERY    . LUMBAR LAMINECTOMY    . POLYPECTOMY    . RIGHT HEART CATH N/A 08/20/2019   Procedure: RIGHT HEART CATH;  Surgeon: Larey Dresser, MD;  Location: [place] CV LAB;  Service: Cardiovascular;  Laterality: N/A;  . RIGHT/LEFT HEART CATH AND CORONARY ANGIOGRAPHY N/A 03/07/2019   Procedure: RIGHT/LEFT HEART CATH AND CORONARY ANGIOGRAPHY;  Surgeon: Burnell Blanks, MD;  Location: [place] CV LAB;  Service: Cardiovascular;  Laterality: N/A;  . TEE WITHOUT CARDIOVERSION N/A 01/26/2013   Procedure: TRANSESOPHAGEAL ECHOCARDIOGRAM (TEE);  Surgeon: Larey Dresser, MD;  Location: [place];  Service: Cardiovascular;  Laterality: N/A;  . TEE WITHOUT CARDIOVERSION N/A 10/28/2017   Procedure: TRANSESOPHAGEAL ECHOCARDIOGRAM (TEE);  Surgeon: Larey Dresser, MD;  Location: [facility] ENDOSCOPY;  Service: Cardiovascular;  Laterality: N/A;  . TEE WITHOUT CARDIOVERSION N/A 05/08/2019   Procedure: TRANSESOPHAGEAL ECHOCARDIOGRAM (TEE);  Surgeon: Burnell Blanks, MD;  Location: [place] CV LAB;  Service: Open Heart Surgery;  Laterality: N/A;  . TRANSCATHETER AORTIC VALVE REPLACEMENT, TRANSFEMORAL N/A 05/08/2019   Procedure: TRANSCATHETER AORTIC VALVE REPLACEMENT, TRANSFEMORAL;  Surgeon: Burnell Blanks, MD;  Location: [place] CV LAB;  Service: Open Heart Surgery;  Laterality: N/A;    SOCIAL HISTORY: Social History   Socioeconomic History  . Marital status: Married    Spouse name: Not on file  . Number of children: 0  . Years of education: Not on file  . Highest education level: Not on  file  Occupational History  . Occupation: Retired Lobbyist: [employer]  Tobacco Use  . Smoking status: Never Smoker  . Smokeless tobacco: Never Used  Substance and Sexual Activity  . Alcohol use: Yes    Comment: Drinks 1 glass of wine nightly/socially  . Drug use: No  . Sexual activity: Yes  Other Topics Concern  . Not on file  Social History Narrative   Patient lives in [place] w/ his wife. He is a native of [place]. 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   Right-handed   Caffeine: 2 cups coffee per day   Social Determinants of Health   Financial Resource Strain:   . Difficulty of Paying Living Expenses: Not on file  Food Insecurity:   . Worried About Charity fundraiser in the Last Year: Not on file  . Ran Out of Food in the Last Year: Not on file  Transportation Needs:   . Lack of Transportation (Medical): Not on file  . Lack of Transportation (Non-Medical): Not on file  Physical Activity:   . Days of Exercise per Week: Not on file  . Minutes of Exercise per Session: Not on file  Stress:   . Feeling of Stress : Not on file  Social Connections:   . Frequency of Communication with Friends and Family: Not on file  . Frequency of Social Gatherings with Friends and Family: Not on file  . Attends Religious Services: Not on file  . Active Member of Clubs or Organizations: Not on file  . Attends Archivist Meetings: Not on file  . Marital Status: Not on file  Intimate Partner Violence:   . Fear of Current or Ex-Partner: Not on file  . Emotionally Abused: Not on file  . Physically Abused: Not on file  . Sexually Abused: Not on file    FAMILY HISTORY: Family History  Problem Relation Age of Onset  . Colon cancer Mother 52  . Hypertension Other   . Coronary artery disease Neg Hx   . Colon polyps Neg Hx   . Esophageal cancer Neg Hx   . Rectal cancer Neg Hx   . Stomach cancer Neg Hx      ALLERGIES:  is allergic to xarelto [rivaroxaban]; corticosteroids; ramipril; and benazepril.  MEDICATIONS:  Current Outpatient Medications  Medication Sig Dispense Refill  . acyclovir (ZOVIRAX) 400 MG tablet Take 1 tablet (400 mg total) by mouth 2 (two) times daily. 180 tablet 3  . amLODipine (NORVASC)  5 MG tablet Take 1 tablet (5 mg total) by mouth daily. 90 tablet 3  . Carboxymethylcellul-Glycerin (LUBRICATING EYE DROPS OP) Place 1 drop into both eyes daily as needed (dry eyes).    . Cholecalciferol (VITAMIN D) 50 MCG (2000 UT) tablet Take 2,000 Units by mouth daily.    [name] dexamethasone (DECADRON) 4 MG tablet Take 3 tablets (12 mg total) by mouth once a week. (Patient taking differently: Take 20 mg by mouth every Monday. ) 20 tablet 6  . diclofenac Sodium (VOLTAREN) 1 % GEL Apply 1 application topically 4 (four) times daily as needed (pain).    . DULoxetine (CYMBALTA) 60 MG capsule Take 1 capsule (60 mg total) by mouth daily. 30 capsule 1  . ELIQUIS 5 MG TABS tablet TAKE 1 TABLET BY MOUTH TWICE A DAY (Patient taking differently: Take 5 mg by mouth 2 (two) times daily. ) 60 tablet 6  . ergocalciferol (VITAMIN D2) 1.25 MG (50000 UT) capsule Take 1 capsule (50,000 Units total) by mouth once a week. 12 capsule 3  . famotidine (PEPCID) 40 MG tablet Take 1 tablet (40 mg total) by mouth daily. 30 tablet 11  . fentaNYL (DURAGESIC) 12 MCG/HR Place 1 patch onto the skin every 3 (three) days. 10 patch 0  . furosemide (LASIX) 20 MG tablet Take '40mg'$  (2 tabs) daily alternating with '20mg'$  (1 tab) daily every other day 135 tablet 1  . gabapentin (NEURONTIN) 100 MG capsule 200 mg po qam and '400mg'$  po qHS (Patient taking differently: Take 200-400 mg by mouth See admin instructions. Take 200 mg in the morning and 400 mg at bedtime) 180 capsule 2  . lenalidomide (REVLIMID) 15 MG capsule Take 1 capsule (15 mg total) by mouth daily. Take for 14 days on, 7 days off, repeat every 21 days. (Patient taking differently:  Take 15 mg by mouth See admin instructions. Take 15 mg daily for 14 days on, 7 days off, repeat every 21 days.) 14 capsule 0  . loratadine (CLARITIN) 10 MG tablet Take 1 tablet (10 mg total) by mouth daily. 30 tablet 11  . LORazepam (ATIVAN) 0.5 MG tablet Take 1 tablet (0.5 mg total) by mouth every 6 (six) hours as needed (Nausea or vomiting). 30 tablet 0  . metoprolol succinate (TOPROL-XL) 25 MG 24 hr tablet Take 1 tablet (25 mg total) by mouth 2 (two) times daily. 60 tablet 6  . ondansetron (ZOFRAN) 8 MG tablet Take 1 tablet (8 mg total) by mouth 2 (two) times daily as needed (Nausea or vomiting). 30 tablet 1  . oxyCODONE (OXY IR/ROXICODONE) 5 MG immediate release tablet Take 1-2 tablets (5-10 mg total) by mouth every 4 (four) hours as needed for moderate pain or severe pain. 120 tablet 0  . potassium chloride SA (KLOR-CON M20) 20 MEQ tablet Take 2 tablets (40 mEq total) by mouth 2 (two) times daily. 120 tablet 1  . prochlorperazine (COMPAZINE) 10 MG tablet Take 1 tablet (10 mg total) by mouth every 6 (six) hours as needed (Nausea or vomiting). 30 tablet 1  . senna-docusate (SENNA S) 8.6-50 MG tablet Take 2 tablets by mouth at bedtime. (Patient taking differently: Take 2 tablets by mouth at bedtime as needed for mild constipation. ) 60 tablet 2  . spironolactone (ALDACTONE) 50 MG tablet Take 1 tablet (50 mg total) by mouth daily. 90 tablet 3   No current facility-administered medications for this visit.    REVIEW OF SYSTEMS:   A 10+ POINT REVIEW OF SYSTEMS WAS  OBTAINED including neurology, dermatology, psychiatry, cardiac, respiratory, lymph, extremities, GI, GU, Musculoskeletal, constitutional, breasts, reproductive, HEENT.  All pertinent positives are noted in the HPI.  All others are negative.   PHYSICAL EXAMINATION: ECOG FS:1 - Symptomatic but completely ambulatory  Vitals:   09/03/19 1540  BP: (!) 149/87  Pulse: 78  Resp: 16  Temp: 98.5 F (36.9 C)  SpO2: 100%   Wt Readings from  Last 3 Encounters:  09/03/19 163 lb 6.4 oz (74.1 kg)  08/20/19 180 lb (81.6 kg)  08/16/19 181 lb 12.8 oz (82.5 kg)   Body mass index is 22.79 kg/m.    Exam was given in a chair   GENERAL:alert, in no acute distress and comfortable SKIN: no acute rashes, no significant lesions EYES: conjunctiva are pink and non-injected, sclera anicteric OROPHARYNX: MMM, no exudates, no oropharyngeal erythema or ulceration NECK: supple, no JVD LYMPH:  no palpable lymphadenopathy in the cervical, axillary or inguinal regions LUNGS: clear to auscultation b/l with normal respiratory effort HEART: regular rate & rhythm ABDOMEN:  normoactive bowel sounds , non tender, not distended. No palpable hepatosplenomegaly.  Extremity: no pedal edema PSYCH: alert & oriented x 3 with fluent speech NEURO: no focal motor/sensory deficits  LABORATORY DATA:  I have reviewed the data as listed  . CBC Latest Ref Rng & Units 08/28/2019 08/20/2019 08/20/2019  WBC 4.0 - 10.5 K/uL 7.2 - -  Hemoglobin 13.0 - 17.0 g/dL 13.9 11.2(L) 11.2(L)  Hematocrit 39.0 - 52.0 % 42.5 33.0(L) 33.0(L)  Platelets 150 - 400 K/uL 151 - -    . CMP Latest Ref Rng & Units 08/28/2019 08/20/2019 08/20/2019  Glucose 70 - 99 mg/dL 122(H) - -  BUN 8 - 23 mg/dL 36(H) - -  Creatinine 0.61 - 1.24 mg/dL 1.52(H) - -  Sodium 135 - 145 mmol/L 139 141 140  Potassium 3.5 - 5.1 mmol/L 4.5 4.0 4.1  Chloride 98 - 111 mmol/L 103 - -  CO2 22 - 32 mmol/L 22 - -  Calcium 8.9 - 10.3 mg/dL 9.3 - -  Total Protein 6.5 - 8.1 g/dL 6.7 - -  Total Bilirubin 0.3 - 1.2 mg/dL 0.6 - -  Alkaline Phos 38 - 126 U/L 68 - -  AST 15 - 41 U/L 19 - -  ALT 0 - 44 U/L 18 - -   Component     Latest Ref Rng & Units 04/30/2019  Total Protein, Urine-UPE24     Not Estab. mg/dL   Total Protein, Urine-Ur/day     30 - 150 mg/24 hr   ALBUMIN, U     %   ALPHA 1 URINE     %   Alpha 2, Urine     %   % BETA, Urine     %   GAMMA GLOBULIN URINE     %   Free Kappa Lt Chains,Ur      0.63 - 113.79 mg/L   Free Lambda Lt Chains,Ur     0.47 - 11.77 mg/L   Free Kappa/Lambda Ratio     1.03 - 31.76   Immunofixation Result, Urine        Total Volume        M-SPIKE %, Urine     Not Observed %   M-Spike, mg/24 hr     Not Observed mg/24 hr   NOTE:        IgG (Immunoglobin G), Serum     603 - 1,613 mg/dL 1,740 (H)  IgA  61 - 437 mg/dL 16 (L)  IgM (Immunoglobulin M), Srm     15 - 143 mg/dL 9 (L)  Total Protein ELP     6.0 - 8.5 g/dL 7.3  Albumin SerPl Elph-Mcnc     2.9 - 4.4 g/dL 3.8  Alpha 1     0.0 - 0.4 g/dL 0.3  Alpha2 Glob SerPl Elph-Mcnc     0.4 - 1.0 g/dL 0.7  B-Globulin SerPl Elph-Mcnc     0.7 - 1.3 g/dL 0.9  Gamma Glob SerPl Elph-Mcnc     0.4 - 1.8 g/dL 1.6  M Protein SerPl Elph-Mcnc     Not Observed g/dL 1.5 (H)  Globulin, Total     2.2 - 3.9 g/dL 3.5  Albumin/Glob SerPl     0.7 - 1.7 1.1  IFE 1      Comment  Please Note (HCV):      Comment  Kappa free light chain     3.3 - 19.4 mg/L 821.2 (H)  Lamda free light chains     5.7 - 26.3 mg/L 1.9 (L)  Kappa, lamda light chain ratio     0.26 - 1.65 432.21 (H)  LDH     98 - 192 U/L 132  Sed Rate     0 - 16 mm/hr 3   05/24/2019 FISH Panel     05/24/2019 Bone Marrow Biopsy    04/16/2019 Surgical Pathology:   RADIOGRAPHIC STUDIES: I have personally reviewed the radiological images as listed and agreed with the findings in the report. CARDIAC CATHETERIZATION  Result Date: 08/20/2019 Normal filling pressures and cardiac output.  Peripheral edema is likely primarily due to venous insufficiency.    ASSESSMENT & PLAN:   78 yo   #1 Recently diagnosed Plasma cell myeloma  03/28/2019 MRI pelvis w/wo contrast revealed "1. Destructive bone lesions as detailed above. Findings most consistent with metastatic disease. PET-CT may be helpful for further evaluation and to establish a primary tumor. The right pelvic bone lesions should be amenable to image guided biopsy but a PET scan may  demonstrate easier/safer biopsy sites. 2. No intrapelvic mass or adenopathy. 3. Benign intraosseous lipoma involving the left anterior superior acetabulum."  04/06/2019 PET whole body revealed "1. Diffuse osseous metastatic disease as detailed above without findings for a primary neoplasm in the chest, abdomen or pelvis. The large destructive lesion involving the right ischium should be amenable to image guided biopsy. 2. Two small retroperitoneal lymph nodes and 1 small right obturator node showing hypermetabolism."   04/16/2019 Posterior right pelvis bone biopsy revealed "PLASMA CELL NEOPLASM"  05/24/2019 Bone Marrow Biopsy revealed "BONE MARROW: - CELLULAR MARROW WITH INVOLVEMENT BY PLASMA CELL NEOPLASM (20%) PERIPHERAL BLOOD: - MORPHOLOGICALLY UNREMARKABLE"  05/24/2019 FISH Panel revealed "ABNORMAL result with 11q+, 14q+ and +17"  #2 Severe aortic stenosis with bicuspid aortic valve -10/26/2018 ECHO revealed AVA at 0.8 cm2 and LV EF of 60-65% -05/08/2019 pt had a Transfemoral Transcatheter Aortic Valve Replacement   PLAN: -Discussed pt labwork, 08/28/19; all values are WNL except for Glucose at 122, BUN at 36, Creatinine at 1.52, GFR Est Non Af Am at 43. -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  -Continue 2 Oxycodone every 4-6 hours as needed  -Continue Lasix at 20 mg per day   -Continue Zometa every 4 weeks -Continue Loratadine and Famotidine    -Continue Potassium supplement -Continue Cymbalta 60 mg once per day -Will increase Gabapentin dosage to 300 mg in the morning, 300 mg in the afternoon, 600 mg  before bed -Recommended that the pt continue to eat well, drink at least 48-64 oz of water each day, and walk 20-30 minutes each day. -Pt has an appointment with Dr. Posey Pronto on 12/28 -Will refer to Speech Therapist for swallow study -Will see back in 1 month with labs  FOLLOW UP: Referral to speech pathology for swallow evaluation for oropharyngeal dysphagia RTC with Dr Irene Limbo with  labs in 4 week  The total time spent in the appt was 20 minutes and more than 50% was on counseling and direct patient cares.  All of the patient's questions were answered with apparent satisfaction. The patient knows to call the clinic with any problems, questions or concerns.   Sullivan Lone MD [place] AAHIVMS [facility] Hematology/Oncology Physician [facility]  (Office):       [phone] (Work cell):  [phone] (Fax):           [phone]  09/03/2019 4:24 PM  I, Yevette Edwards, am acting as a scribe for Dr. Sullivan Lone.   .I have reviewed the above documentation for accuracy and completeness, and I agree with the above. Brunetta Genera MD

## 2019-09-04 ENCOUNTER — Telehealth: Payer: Self-pay | Admitting: Hematology

## 2019-09-04 ENCOUNTER — Ambulatory Visit (HOSPITAL_COMMUNITY)
Admission: RE | Admit: 2019-09-04 | Discharge: 2019-09-04 | Disposition: A | Discharge status: Home / Self Care | Payer: Medicare Other | Source: Ambulatory Visit | Attending: Internal Medicine | Admitting: Internal Medicine

## 2019-09-04 DIAGNOSIS — I5032 Chronic diastolic (congestive) heart failure: Secondary | ICD-10-CM | POA: Diagnosis present

## 2019-09-04 LAB — BASIC METABOLIC PANEL
Anion gap: 12 (ref 5–15)
BUN: 36 mg/dL — ABNORMAL HIGH (ref 8–23)
CO2: 22 mmol/L (ref 22–32)
Calcium: 9.5 mg/dL (ref 8.9–10.3)
Chloride: 105 mmol/L (ref 98–111)
Creatinine, Ser: 1.58 mg/dL — ABNORMAL HIGH (ref 0.61–1.24)
GFR calc Af Amer: 48 mL/min — ABNORMAL LOW (ref >60)
GFR calc non Af Amer: 41 mL/min — ABNORMAL LOW (ref >60)
Glucose, Bld: 163 mg/dL — ABNORMAL HIGH (ref 70–99)
Potassium: 4.7 mmol/L (ref 3.5–5.1)
Sodium: 139 mmol/L (ref 135–145)

## 2019-09-04 NOTE — Telephone Encounter (Signed)
Scheduled appt per 12/21 los.  Spoke with pt and he is aware of the appt date and time.

## 2019-09-06 ENCOUNTER — Other Ambulatory Visit: Payer: Self-pay

## 2019-09-07 ENCOUNTER — Other Ambulatory Visit: Payer: Self-pay | Admitting: Hematology

## 2019-09-10 ENCOUNTER — Ambulatory Visit (INDEPENDENT_AMBULATORY_CARE_PROVIDER_SITE_OTHER): Payer: Medicare Other | Admitting: Neurology

## 2019-09-10 ENCOUNTER — Other Ambulatory Visit: Payer: Self-pay

## 2019-09-10 ENCOUNTER — Telehealth: Payer: Self-pay | Admitting: *Deleted

## 2019-09-10 ENCOUNTER — Encounter: Payer: Self-pay | Admitting: Neurology

## 2019-09-10 VITALS — BP 134/84 | HR 78 | Ht 71.0 in | Wt 160.0 lb

## 2019-09-10 DIAGNOSIS — G620 Drug-induced polyneuropathy: Secondary | ICD-10-CM | POA: Diagnosis not present

## 2019-09-10 DIAGNOSIS — T451X5A Adverse effect of antineoplastic and immunosuppressive drugs, initial encounter: Secondary | ICD-10-CM | POA: Diagnosis not present

## 2019-09-10 MED ORDER — GABAPENTIN 300 MG PO CAPS: ORAL_CAPSULE | ORAL | 3 refills | Status: DC

## 2019-09-10 NOTE — Progress Notes (Signed)
[facility] Clinic Note - Initial Visit   Date: 09/10/19  Rafid Watt MRN: [id number] DOB: 08/28/1941   Dear Dr. Aundra Dubin:  Thank you for your kind referral of Miguelangel Maciolek for consultation of peripheral neuropathy. Although his history is well known to you, please allow [place] to reiterate it for the purpose of our medical record. The patient was accompanied to the clinic by self.   History of Present Illness: Daiki Kun is a 78 y.o. right-handed [place] male with plasma cell myeloma, PAF, hypertension, hyperlipidemia, aortic stenosis, and AAA presenting for evaluation of bilateral feet and leg pain.  He was diagnosed with plasma cell myeloma in August 2020 after lytic lesions were incidentally seen on CT C/A/P as part of his TAVR work-up.  He started chemotherapy with Velcade and Revlimid in September.  Starting around October 2020, he began having numbness and cold sensation in the feet along with deep sharp pain in the legs.  Pain can vary throughout the day, but always present.  He takes Cymbalta 60mg , gabapentin 200mg  in the morning and 400mg  at bedtime, oxycodone, and fentanyl patch for pain.  He gets the most relief with oxycodone, but still not complete relief.  He also complains of imbalance and loss of muscle mass in the arms and legs.  He has last 40lb over the past few months.  He has some numbness in the fingers and has difficulty with fine motor tasks.  He was having a lot of leg swelling, but this has significantly improved.   Out-side paper records, electronic medical record, and images have been reviewed where available and summarized as:  MRI brain wwo contrast 02/18/2017: 1. Complicated appearing left mastoid effusion suspicious for infectious mastoiditis, although appears to spare the left mastoid antrum and tympanic cavity. Also, no other complicating features (no dural or intracranial involvement, and no peri-auricular soft tissue extension). 2.  Smaller and simple appearing right mastoid effusion. 3. No other IAC abnorma       lity. 4. Normal for age MRI appearance of the brain.  NCS/EMG of the legs 08/22/2018:   1. Chronic L4-5 radiculopathy affecting the right lower extremity, moderate in degree electrically.   2. Chronic sensorimotor polyneuropathy affecting the lower extremities, which is mild and worse on the left.   Lab Results  Component Value Date   HGBA1C 5.8 (H) 05/04/2019   Lab Results  Component Value Date   [id number] 07/02/2019   Lab Results  Component Value Date   TSH 4.76 (H) 10/03/2018   Lab Results  Component Value Date   ESRSEDRATE 3 04/30/2019    Past Medical History:  Diagnosis Date  . Acute blood loss anemia   . Adenomatous colon polyp   . Ascending aortic aneurysm ([place])   . Bicuspid aortic valve   . Cancer ([place])   . Chest pain    ETT-myoview 12/11 w/exercise, no chest pain, no significant ST changes, EF 69%, no evidence for ischemia or infarction.  . CHF NYHA class I (no symptoms from ordinary activities), acute, diastolic ([place])   . Fatty liver    mild  . GI bleed   . GI bleeding 07/21/2018   post polypectomy  . Hemorrhoids   . HTN (hypertension)   . Hypercholesteremia   . Hypokalemia   . Internal hemorrhoids   . LBP (low back pain)   . Moderate aortic stenosis   . Osteoarthritis   . Paroxysmal atrial fibrillation ([facility])    a. new onset Afib  in 07/2008. He underwent ibutilide cardioversion successfully. b. Recurrence 01/2013 s/p TEE/DCCV - was on Xarelto but he stopped it as he was convinced it was causing joint pn. c. Recurrence 01/2016 - spont conv to NSR. Pt took Eliquis x1 mo then declined further anticoag. d. Recurrence 07/2016.  [name] Pneumonia   . Tubular adenoma of colon     Past Surgical History:  Procedure Laterality Date  . BACK SURGERY  x12 years ago  . CARDIOVERSION N/A 01/26/2013   Procedure: CARDIOVERSION;  Surgeon: Larey Dresser, MD;  Location: [facility] ENDOSCOPY;   Service: Cardiovascular;  Laterality: N/A;  . CARDIOVERSION N/A 10/28/2017   Procedure: CARDIOVERSION;  Surgeon: Larey Dresser, MD;  Location: [facility] ENDOSCOPY;  Service: Cardiovascular;  Laterality: N/A;  . CARDIOVERSION N/A 03/03/2018   Procedure: CARDIOVERSION;  Surgeon: Lelon Perla, MD;  Location: [facility] ENDOSCOPY;  Service: Cardiovascular;  Laterality: N/A;  . COLONOSCOPY    . COLONOSCOPY  07/17/2018   at [facility]  . HEMORRHOID SURGERY    . LUMBAR LAMINECTOMY    . POLYPECTOMY    . RIGHT HEART CATH N/A 08/20/2019   Procedure: RIGHT HEART CATH;  Surgeon: Larey Dresser, MD;  Location: [place] CV LAB;  Service: Cardiovascular;  Laterality: N/A;  . RIGHT/LEFT HEART CATH AND CORONARY ANGIOGRAPHY N/A 03/07/2019   Procedure: RIGHT/LEFT HEART CATH AND CORONARY ANGIOGRAPHY;  Surgeon: Burnell Blanks, MD;  Location: [place] CV LAB;  Service: Cardiovascular;  Laterality: N/A;  . TEE WITHOUT CARDIOVERSION N/A 01/26/2013   Procedure: TRANSESOPHAGEAL ECHOCARDIOGRAM (TEE);  Surgeon: Larey Dresser, MD;  Location: [place];  Service: Cardiovascular;  Laterality: N/A;  . TEE WITHOUT CARDIOVERSION N/A 10/28/2017   Procedure: TRANSESOPHAGEAL ECHOCARDIOGRAM (TEE);  Surgeon: Larey Dresser, MD;  Location: [facility] ENDOSCOPY;  Service: Cardiovascular;  Laterality: N/A;  . TEE WITHOUT CARDIOVERSION N/A 05/08/2019   Procedure: TRANSESOPHAGEAL ECHOCARDIOGRAM (TEE);  Surgeon: Burnell Blanks, MD;  Location: [place] CV LAB;  Service: Open Heart Surgery;  Laterality: N/A;  . TRANSCATHETER AORTIC VALVE REPLACEMENT, TRANSFEMORAL N/A 05/08/2019   Procedure: TRANSCATHETER AORTIC VALVE REPLACEMENT, TRANSFEMORAL;  Surgeon: Burnell Blanks, MD;  Location: [place] [facility];  Service: Open Heart Surgery;  Laterality: N/A;     Medications:  Outpatient Encounter Medications as of 09/10/2019  Medication Sig Note  . acyclovir (ZOVIRAX) 400 MG tablet Take 1 tablet (400 mg total) by mouth 2 (two)  times daily.   [name] amLODipine (NORVASC) 5 MG tablet Take 1 tablet (5 mg total) by mouth daily.   . Carboxymethylcellul-Glycerin (LUBRICATING EYE DROPS OP) Place 1 drop into both eyes daily as needed (dry eyes).   . Cholecalciferol (VITAMIN D) 50 MCG (2000 UT) tablet Take 2,000 Units by mouth daily.   [name] dexamethasone (DECADRON) 4 MG tablet Take 3 tablets (12 mg total) by mouth once a week. (Patient taking differently: Take 20 mg by mouth every Monday. )   . diclofenac Sodium (VOLTAREN) 1 % GEL Apply 1 application topically 4 (four) times daily as needed (pain).   [name] ELIQUIS 5 MG TABS tablet TAKE 1 TABLET BY MOUTH TWICE A DAY (Patient taking differently: Take 5 mg by mouth 2 (two) times daily. )   . ergocalciferol (VITAMIN D2) 1.25 MG (50000 UT) capsule Take 1 capsule (50,000 Units total) by mouth once a week.   . famotidine (PEPCID) 40 MG tablet Take 1 tablet (40 mg total) by mouth daily.   . fentaNYL (DURAGESIC) 12 MCG/HR Place 1 patch onto the  skin every 3 (three) days.   . furosemide (LASIX) 20 MG tablet Take 40mg  (2 tabs) daily alternating with 20mg  (1 tab) daily every other day   . lenalidomide (REVLIMID) 15 MG capsule Take 1 capsule (15 mg total) by mouth daily. Take for 14 days on, 7 days off, repeat every 21 days. (Patient taking differently: Take 15 mg by mouth See admin instructions. Take 15 mg daily for 14 days on, 7 days off, repeat every 21 days.)   . loratadine (CLARITIN) 10 MG tablet Take 1 tablet (10 mg total) by mouth daily.   [name] LORazepam (ATIVAN) 0.5 MG tablet Take 1 tablet (0.5 mg total) by mouth every 6 (six) hours as needed (Nausea or vomiting).   . metoprolol succinate (TOPROL-XL) 25 MG 24 hr tablet Take 1 tablet (25 mg total) by mouth 2 (two) times daily.   . ondansetron (ZOFRAN) 8 MG tablet Take 1 tablet (8 mg total) by mouth 2 (two) times daily as needed (Nausea or vomiting).   [name] oxyCODONE (OXY IR/ROXICODONE) 5 MG immediate release tablet Take 1-2 tablets (5-10 mg total) by mouth  every 4 (four) hours as needed for moderate pain or severe pain.   . potassium chloride SA (KLOR-CON M20) 20 MEQ tablet Take 2 tablets (40 mEq total) by mouth 2 (two) times daily.   . prochlorperazine (COMPAZINE) 10 MG tablet Take 1 tablet (10 mg total) by mouth every 6 (six) hours as needed (Nausea or vomiting).   [name] senna-docusate (SENNA S) 8.6-50 MG tablet Take 2 tablets by mouth at bedtime. (Patient taking differently: Take 2 tablets by mouth at bedtime as needed for mild constipation. )   . spironolactone (ALDACTONE) 50 MG tablet Take 1 tablet (50 mg total) by mouth daily.   [name] amoxicillin (AMOXIL) 500 MG capsule amoxicillin 500 mg capsule   . DULoxetine (CYMBALTA) 60 MG capsule Take 1 capsule (60 mg total) by mouth daily. 08/17/2019: Hasnt started  . gabapentin (NEURONTIN) 100 MG capsule 200 mg po qam and 400mg  po qHS (Patient taking differently: Take 200-400 mg by mouth See admin instructions. Take 200 mg in the morning and 400 mg at bedtime) 09/10/2019: Is taken   No facility-administered encounter medications on file as of 09/10/2019.    Allergies:  Allergies  Allergen Reactions  . Xarelto [Rivaroxaban] Other (See Comments) and Hypertension    INCREASED BP-HYPERTENSIVE EVENTS  . Corticosteroids Other (See Comments)    Made the patient  "sick," feel "weird," and his "body rejected" them   . Ramipril Other (See Comments)    Could not eat or sleep, lost muscle mass  . Benazepril Cough    Family History: Family History  Problem Relation Age of Onset  . Colon cancer Mother 76  . Hypertension Other   . Coronary artery disease Neg Hx   . Colon polyps Neg Hx   . Esophageal cancer Neg Hx   . Rectal cancer Neg Hx   . Stomach cancer Neg Hx     Social History: Social History   Tobacco Use  . Smoking status: Never Smoker  . Smokeless tobacco: Never Used  Substance Use Topics  . Alcohol use: Yes    Comment: Drinks 1 glass of wine nightly/socially  . Drug use: No   Social  History   Social History Narrative   Patient lives in [place] w/ his wife. He is a native of [place]. 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   Right-handed  Caffeine: 2 cups coffee per day   Two story home    Review of Systems:  CONSTITUTIONAL: No fevers, chills, night sweats, + 30lb weight loss.   EYES: No visual changes or eye pain ENT: No hearing changes.  No history of nose bleeds.   RESPIRATORY: No cough, wheezing and shortness of breath.   CARDIOVASCULAR: Negative for chest pain, and palpitations.   GI: Negative for abdominal discomfort, blood in stools or black stools.  No recent change in bowel habits.   GU:  No history of incontinence.   MUSCLOSKELETAL: No history of joint pain or swelling.  No myalgias.   SKIN: Negative for lesions, rash, and itching.   HEMATOLOGY/ONCOLOGY: Negative for prolonged bleeding, bruising easily, and swollen nodes.  +history of cancer.   ENDOCRINE: Negative for cold or heat intolerance, polydipsia or goiter.   PSYCH:  +depression or anxiety symptoms.   NEURO: As Above.   Vital Signs:  BP 134/84   Pulse 78   Ht 5\' 11"  (1.803 m)   Wt 160 lb (72.6 kg)   SpO2 100%   BMI 22.32 kg/m    General Medical Exam:   General:  Thin-appearing, comfortable.   Eyes/ENT: see cranial nerve examination.   Neck:   No carotid bruits. Respiratory:  Clear to auscultation, good air entry bilaterally.   Cardiac:  Regular rate and rhythm, no murmur.   Extremities:  No deformities, no edema, or skin discoloration.  Skin:  No rashes or lesions.  Neurological Exam: MENTAL STATUS including orientation to time, place, person, recent and remote memory, attention span and concentration, language, and fund of knowledge is normal.  Speech is not dysarthric.  CRANIAL NERVES: II:  No visual field defects.   III-IV-VI: Pupils equal round and reactive to light.  Normal conjugate, extra-ocular eye movements in all directions of  gaze.  No nystagmus.  No ptosis.   V:  Normal facial sensation.    VII:  Normal facial symmetry and movements.   VIII:  Normal hearing and vestibular function.   IX-X:  Normal palatal movement.   XI:  Normal shoulder shrug and head rotation.   XII:  Normal tongue strength and range of motion, no deviation or fasciculation.  MOTOR:  Generalized loss of muscle bulk throughout arms and lower legs,  No fasciculations or abnormal movements.  No pronator drift.   Upper Extremity:  Right  Left  Deltoid  5/5   5/5   Biceps  5/5   5/5   Triceps  5/5   5/5   Infraspinatus 5/5  5/5  Medial pectoralis 5/5  5/5  Wrist extensors  5/5   5/5   Wrist flexors  5/5   5/5   Finger extensors  5/5   5/5   Finger flexors  5/5   5/5   Dorsal interossei  5-/5   5-/5   Abductor pollicis  5/5   5/5   Tone (Ashworth scale)  0  0   Lower Extremity:  Right  Left  Hip flexors  5/5   5/5   Hip extensors  5/5   5/5   Adductor 5/5  5/5  Abductor 5/5  5/5  Knee flexors  5/5   5/5   Knee extensors  5/5   5/5   Dorsiflexors  5-/5   5-/5   Plantarflexors  4+/5   4+/5   Toe extensors  4/5   4/5   Toe flexors  4/5   4/5   Tone (Ashworth  scale)  0  0   MSRs:  Right        Left                  brachioradialis 2+  2+  biceps 2+  2+  triceps 2+  2+  patellar 1+  2+  ankle jerk 0  0  Hoffman no  no  plantar response down  down   SENSORY: Vibration reduced to 50% at the knees and absent distal to ankles bilaterally.  Gradient pattern temperature and pin prick loss distal to lower legs into the feet. Romberg's sign positive.   COORDINATION/GAIT: Normal finger-to- nose-finger.  Intact rapid alternating movements bilaterally. Gait appears wide-based, assisted with cane, slow and cautious.  Stressed and tandem gait not tested.   IMPRESSION: Peripheral neuropathy, chemotherapy-induced (Velcade and Revlimid) causing painful paresthesias, sensory ataxia, distal weakness.  Unfortunately, management options are very  limited as symptoms should slowly start to improve after his chemotherapy has been held, but this also poses increase risk of myeloma to worsen.   I offered NCS/EMG of the left arm and leg to characterize the nature of his neuropathy which may help prognosticate recovery.  He would like to think about this and notify my office, if he chooses to schedule testing In the meantime, management is strictly supportive.    I will increase his gabapentin to 300mg  in the morning and 600mg  at bedtime  He is already on Cymbalta 60mg  oxyocodone, and fentalyl patch  Encouraged to always use a cane for imbalance  Patient educated on daily foot inspection, fall prevention, and safety precautions around the home.  Return to clinic in 3 months.   Thank you for allowing me to participate in patient's care.  If I can answer any additional questions, I would be pleased to do so.    Sincerely,    Junie Avilla K. Posey Pronto, DO

## 2019-09-10 NOTE — Telephone Encounter (Signed)
Received refill request for KlorCon and request for [facility] day supply of [facility] from [facility] RX for [place] sent by Dr. Irene Limbo on 08/15/2019 was on file and will be filled at this time Refill for current Duloxetine RX available and will be filled at this time without change to 90 day supply

## 2019-09-10 NOTE — Patient Instructions (Addendum)
Start gabapentin 300mg  in the morning and 600mg  at bedtime  If you want to proceed with nerve testing, please let me know  Return to clinic in 3 months

## 2019-09-13 ENCOUNTER — Inpatient Hospital Stay (HOSPITAL_COMMUNITY)
Admission: EM | Admit: 2019-09-13 | Discharge: 2019-09-21 | DRG: 871 | Disposition: A | Discharge status: Home Health | Payer: Medicare Other | Attending: Internal Medicine | Admitting: Internal Medicine

## 2019-09-13 ENCOUNTER — Ambulatory Visit (INDEPENDENT_AMBULATORY_CARE_PROVIDER_SITE_OTHER): Payer: Medicare Other | Admitting: Internal Medicine

## 2019-09-13 ENCOUNTER — Encounter (HOSPITAL_COMMUNITY): Payer: Self-pay

## 2019-09-13 ENCOUNTER — Other Ambulatory Visit: Payer: Self-pay

## 2019-09-13 ENCOUNTER — Encounter: Payer: Self-pay | Admitting: Internal Medicine

## 2019-09-13 VITALS — BP 146/88 | HR 61 | Temp 98.0°F | Ht 71.0 in | Wt 158.0 lb

## 2019-09-13 DIAGNOSIS — K5903 Drug induced constipation: Secondary | ICD-10-CM | POA: Diagnosis present

## 2019-09-13 DIAGNOSIS — M353 Polymyalgia rheumatica: Secondary | ICD-10-CM | POA: Diagnosis present

## 2019-09-13 DIAGNOSIS — Z0000 Encounter for general adult medical examination without abnormal findings: Secondary | ICD-10-CM

## 2019-09-13 DIAGNOSIS — M5481 Occipital neuralgia: Secondary | ICD-10-CM

## 2019-09-13 DIAGNOSIS — A419 Sepsis, unspecified organism: Secondary | ICD-10-CM | POA: Diagnosis not present

## 2019-09-13 DIAGNOSIS — I480 Paroxysmal atrial fibrillation: Secondary | ICD-10-CM | POA: Diagnosis not present

## 2019-09-13 DIAGNOSIS — Z952 Presence of prosthetic heart valve: Secondary | ICD-10-CM

## 2019-09-13 DIAGNOSIS — T451X5A Adverse effect of antineoplastic and immunosuppressive drugs, initial encounter: Secondary | ICD-10-CM | POA: Diagnosis present

## 2019-09-13 DIAGNOSIS — R627 Adult failure to thrive: Secondary | ICD-10-CM | POA: Diagnosis present

## 2019-09-13 DIAGNOSIS — F05 Delirium due to known physiological condition: Secondary | ICD-10-CM | POA: Diagnosis not present

## 2019-09-13 DIAGNOSIS — G622 Polyneuropathy due to other toxic agents: Secondary | ICD-10-CM | POA: Diagnosis not present

## 2019-09-13 DIAGNOSIS — R0602 Shortness of breath: Secondary | ICD-10-CM | POA: Diagnosis not present

## 2019-09-13 DIAGNOSIS — C7951 Secondary malignant neoplasm of bone: Secondary | ICD-10-CM | POA: Diagnosis present

## 2019-09-13 DIAGNOSIS — Z800 Family history of malignant neoplasm of digestive organs: Secondary | ICD-10-CM

## 2019-09-13 DIAGNOSIS — Z20828 Contact with and (suspected) exposure to other viral communicable diseases: Secondary | ICD-10-CM | POA: Diagnosis not present

## 2019-09-13 DIAGNOSIS — I110 Hypertensive heart disease with heart failure: Secondary | ICD-10-CM | POA: Diagnosis present

## 2019-09-13 DIAGNOSIS — R0902 Hypoxemia: Secondary | ICD-10-CM | POA: Diagnosis not present

## 2019-09-13 DIAGNOSIS — I2510 Atherosclerotic heart disease of native coronary artery without angina pectoris: Secondary | ICD-10-CM | POA: Diagnosis present

## 2019-09-13 DIAGNOSIS — K559 Vascular disorder of intestine, unspecified: Secondary | ICD-10-CM | POA: Diagnosis present

## 2019-09-13 DIAGNOSIS — R1084 Generalized abdominal pain: Secondary | ICD-10-CM | POA: Diagnosis not present

## 2019-09-13 DIAGNOSIS — E872 Acidosis, unspecified: Secondary | ICD-10-CM

## 2019-09-13 DIAGNOSIS — G620 Drug-induced polyneuropathy: Secondary | ICD-10-CM | POA: Diagnosis present

## 2019-09-13 DIAGNOSIS — R52 Pain, unspecified: Secondary | ICD-10-CM | POA: Diagnosis not present

## 2019-09-13 DIAGNOSIS — R300 Dysuria: Secondary | ICD-10-CM | POA: Diagnosis not present

## 2019-09-13 DIAGNOSIS — I10 Essential (primary) hypertension: Secondary | ICD-10-CM | POA: Diagnosis present

## 2019-09-13 DIAGNOSIS — Z7901 Long term (current) use of anticoagulants: Secondary | ICD-10-CM

## 2019-09-13 DIAGNOSIS — M79672 Pain in left foot: Secondary | ICD-10-CM | POA: Diagnosis present

## 2019-09-13 DIAGNOSIS — E785 Hyperlipidemia, unspecified: Secondary | ICD-10-CM | POA: Diagnosis present

## 2019-09-13 DIAGNOSIS — L8990 Pressure ulcer of unspecified site, unspecified stage: Secondary | ICD-10-CM | POA: Insufficient documentation

## 2019-09-13 DIAGNOSIS — Z888 Allergy status to other drugs, medicaments and biological substances status: Secondary | ICD-10-CM

## 2019-09-13 DIAGNOSIS — F419 Anxiety disorder, unspecified: Secondary | ICD-10-CM | POA: Diagnosis present

## 2019-09-13 DIAGNOSIS — Z8249 Family history of ischemic heart disease and other diseases of the circulatory system: Secondary | ICD-10-CM

## 2019-09-13 DIAGNOSIS — I872 Venous insufficiency (chronic) (peripheral): Secondary | ICD-10-CM | POA: Diagnosis present

## 2019-09-13 DIAGNOSIS — Z7189 Other specified counseling: Secondary | ICD-10-CM

## 2019-09-13 DIAGNOSIS — E860 Dehydration: Secondary | ICD-10-CM | POA: Diagnosis present

## 2019-09-13 DIAGNOSIS — N179 Acute kidney failure, unspecified: Secondary | ICD-10-CM | POA: Diagnosis present

## 2019-09-13 DIAGNOSIS — I471 Supraventricular tachycardia: Secondary | ICD-10-CM | POA: Diagnosis not present

## 2019-09-13 DIAGNOSIS — Z953 Presence of xenogenic heart valve: Secondary | ICD-10-CM

## 2019-09-13 DIAGNOSIS — C9000 Multiple myeloma not having achieved remission: Secondary | ICD-10-CM | POA: Diagnosis not present

## 2019-09-13 DIAGNOSIS — K760 Fatty (change of) liver, not elsewhere classified: Secondary | ICD-10-CM | POA: Diagnosis present

## 2019-09-13 DIAGNOSIS — E876 Hypokalemia: Secondary | ICD-10-CM | POA: Diagnosis present

## 2019-09-13 DIAGNOSIS — R197 Diarrhea, unspecified: Secondary | ICD-10-CM | POA: Diagnosis present

## 2019-09-13 DIAGNOSIS — R6520 Severe sepsis without septic shock: Secondary | ICD-10-CM | POA: Diagnosis present

## 2019-09-13 DIAGNOSIS — I4891 Unspecified atrial fibrillation: Secondary | ICD-10-CM

## 2019-09-13 DIAGNOSIS — T40605A Adverse effect of unspecified narcotics, initial encounter: Secondary | ICD-10-CM | POA: Diagnosis present

## 2019-09-13 DIAGNOSIS — I5033 Acute on chronic diastolic (congestive) heart failure: Secondary | ICD-10-CM | POA: Diagnosis present

## 2019-09-13 DIAGNOSIS — Z20822 Contact with and (suspected) exposure to covid-19: Secondary | ICD-10-CM | POA: Diagnosis present

## 2019-09-13 DIAGNOSIS — G9341 Metabolic encephalopathy: Secondary | ICD-10-CM | POA: Diagnosis present

## 2019-09-13 DIAGNOSIS — M79671 Pain in right foot: Secondary | ICD-10-CM | POA: Diagnosis present

## 2019-09-13 DIAGNOSIS — G629 Polyneuropathy, unspecified: Secondary | ICD-10-CM

## 2019-09-13 DIAGNOSIS — L89152 Pressure ulcer of sacral region, stage 2: Secondary | ICD-10-CM | POA: Diagnosis present

## 2019-09-13 DIAGNOSIS — R269 Unspecified abnormalities of gait and mobility: Secondary | ICD-10-CM | POA: Diagnosis not present

## 2019-09-13 DIAGNOSIS — R069 Unspecified abnormalities of breathing: Secondary | ICD-10-CM | POA: Diagnosis not present

## 2019-09-13 DIAGNOSIS — I712 Thoracic aortic aneurysm, without rupture: Secondary | ICD-10-CM | POA: Diagnosis present

## 2019-09-13 DIAGNOSIS — D696 Thrombocytopenia, unspecified: Secondary | ICD-10-CM | POA: Diagnosis present

## 2019-09-13 DIAGNOSIS — Z79899 Other long term (current) drug therapy: Secondary | ICD-10-CM

## 2019-09-13 DIAGNOSIS — Z66 Do not resuscitate: Secondary | ICD-10-CM | POA: Diagnosis present

## 2019-09-13 MED ORDER — ONDANSETRON HCL 4 MG/2ML IJ SOLN
4.0000 mg | Freq: Once | INTRAMUSCULAR | Status: AC
  Administered 2019-09-14: 4 mg via INTRAVENOUS
  Filled 2019-09-13: qty 2

## 2019-09-13 MED ORDER — SODIUM CHLORIDE 0.9 % IV BOLUS
1000.0000 mL | Freq: Once | INTRAVENOUS | Status: AC
  Administered 2019-09-14: 1000 mL via INTRAVENOUS

## 2019-09-13 NOTE — ED Triage Notes (Signed)
PT BIB [facility] from home. EMS reported multiple rough days per family. Hx multiple myeloma w mets. Abd tender, N/V/D, liquid stools on arrival. Oncologist aware of pt's arrival at [facility]. Emergency MD in room, to put in orders.  

## 2019-09-13 NOTE — Assessment & Plan Note (Signed)
Dr. Kale  

## 2019-09-13 NOTE — Assessment & Plan Note (Addendum)
We discussed age appropriate health related issues, including available/recomended screening tests and vaccinations. We discussed a need for adhering to a high protein diet and maintenance exercise.  We discussed his nutrition and his chronic illness.  I suggested that you start to use a walker.  Physical therapy at home advised.  All questions were answered.

## 2019-09-13 NOTE — Assessment & Plan Note (Signed)
   Per Dr Posey Pronto: "Peripheral neuropathy, chemotherapy-induced (Velcade and Revlimid) causing painful paresthesias, sensory ataxia, distal weakness.  Unfortunately, management options are very limited as symptoms should slowly start to improve after his chemotherapy has been held, but this also poses increase risk of myeloma to worsen."

## 2019-09-13 NOTE — Assessment & Plan Note (Signed)
Worse 08/2019 Home PT

## 2019-09-13 NOTE — Progress Notes (Signed)
Subjective:  Patient ID: [name] [name], male    DOB: Sep 05, 1941  Age: 78 y.o. MRN: [id number]  CC: No chief complaint on file.   HPI [name] [name] presents for a follow-up.  She comes with his wife. C/o MM - pain in the legs.  He has multiple complaints of weakness, fatigue, shortness of breath, body aches, lack of appetite, muscle wasting. S/p TAVR  Per Dr Posey Pronto: "Peripheral neuropathy, chemotherapy-induced (Velcade and Revlimid) causing painful paresthesias, sensory ataxia, distal weakness.  Unfortunately, management options are very limited as symptoms should slowly start to improve after his chemotherapy has been held, but this also poses increase risk of myeloma to worsen."    He is here with his wife  Outpatient Medications Prior to Visit  Medication Sig Dispense Refill  . acyclovir (ZOVIRAX) 400 MG tablet Take 1 tablet (400 mg total) by mouth 2 (two) times daily. 180 tablet 3  . amLODipine (NORVASC) 5 MG tablet Take 1 tablet (5 mg total) by mouth daily. 90 tablet 3  . amoxicillin (AMOXIL) 500 MG capsule amoxicillin 500 mg capsule    . Carboxymethylcellul-Glycerin (LUBRICATING EYE DROPS OP) Place 1 drop into both eyes daily as needed (dry eyes).    . Cholecalciferol (VITAMIN D) 50 MCG (2000 UT) tablet Take 2,000 Units by mouth daily.    [name] dexamethasone (DECADRON) 4 MG tablet Take 3 tablets (12 mg total) by mouth once a week. (Patient taking differently: Take 20 mg by mouth every Monday. ) 20 tablet 6  . diclofenac Sodium (VOLTAREN) 1 % GEL Apply 1 application topically 4 (four) times daily as needed (pain).    . DULoxetine (CYMBALTA) 60 MG capsule Take 1 capsule (60 mg total) by mouth daily. 30 capsule 1  . ELIQUIS 5 MG TABS tablet TAKE 1 TABLET BY MOUTH TWICE A DAY (Patient taking differently: Take 5 mg by mouth 2 (two) times daily. ) 60 tablet 6  . ergocalciferol (VITAMIN D2) 1.25 MG (50000 UT) capsule Take 1 capsule (50,000 Units total) by mouth once a week. 12 capsule 3  .  famotidine (PEPCID) 40 MG tablet Take 1 tablet (40 mg total) by mouth daily. 30 tablet 11  . fentaNYL (DURAGESIC) 12 MCG/HR Place 1 patch onto the skin every 3 (three) days. 10 patch 0  . furosemide (LASIX) 20 MG tablet Take 40mg  (2 tabs) daily alternating with 20mg  (1 tab) daily every other day 135 tablet 1  . gabapentin (NEURONTIN) 300 MG capsule Take 1 in the morning and 2 tablet at bedtime. 270 capsule 3  . lenalidomide (REVLIMID) 15 MG capsule Take 1 capsule (15 mg total) by mouth daily. Take for 14 days on, 7 days off, repeat every 21 days. (Patient taking differently: Take 15 mg by mouth See admin instructions. Take 15 mg daily for 14 days on, 7 days off, repeat every 21 days.) 14 capsule 0  . loratadine (CLARITIN) 10 MG tablet Take 1 tablet (10 mg total) by mouth daily. 30 tablet 11  . LORazepam (ATIVAN) 0.5 MG tablet Take 1 tablet (0.5 mg total) by mouth every 6 (six) hours as needed (Nausea or vomiting). 30 tablet 0  . metoprolol succinate (TOPROL-XL) 25 MG 24 hr tablet Take 1 tablet (25 mg total) by mouth 2 (two) times daily. 60 tablet 6  . ondansetron (ZOFRAN) 8 MG tablet Take 1 tablet (8 mg total) by mouth 2 (two) times daily as needed (Nausea or vomiting). 30 tablet 1  . oxyCODONE (OXY IR/ROXICODONE) 5 MG immediate  release tablet Take 1-2 tablets (5-10 mg total) by mouth every 4 (four) hours as needed for moderate pain or severe pain. 120 tablet 0  . potassium chloride SA (KLOR-CON M20) 20 MEQ tablet Take 2 tablets (40 mEq total) by mouth 2 (two) times daily. 120 tablet 1  . prochlorperazine (COMPAZINE) 10 MG tablet Take 1 tablet (10 mg total) by mouth every 6 (six) hours as needed (Nausea or vomiting). 30 tablet 1  . senna-docusate (SENNA S) 8.6-50 MG tablet Take 2 tablets by mouth at bedtime. (Patient taking differently: Take 2 tablets by mouth at bedtime as needed for mild constipation. ) 60 tablet 2  . spironolactone (ALDACTONE) 50 MG tablet Take 1 tablet (50 mg total) by mouth daily.  90 tablet 3   No facility-administered medications prior to visit.    ROS: Review of Systems  Constitutional: Positive for fatigue. Negative for appetite change, fever and unexpected weight change.  HENT: Negative for congestion, nosebleeds, sneezing, sore throat and trouble swallowing.   Eyes: Negative for itching and visual disturbance.  Respiratory: Positive for shortness of breath. Negative for cough.   Cardiovascular: Negative for chest pain, palpitations and leg swelling.  Gastrointestinal: Positive for constipation and nausea. Negative for abdominal distention, abdominal pain, blood in stool and diarrhea.  Genitourinary: Negative for frequency and hematuria.  Musculoskeletal: Positive for arthralgias, back pain, gait problem, myalgias and neck stiffness. Negative for joint swelling and neck pain.  Skin: Negative for rash.  Neurological: Positive for weakness and light-headedness. Negative for dizziness, tremors, syncope and speech difficulty.  Hematological: Negative for adenopathy. Bruises/bleeds easily.  Psychiatric/Behavioral: Positive for decreased concentration and dysphoric mood. Negative for agitation, behavioral problems, hallucinations, self-injury, sleep disturbance and suicidal ideas. The patient is not nervous/anxious.     Objective:  BP (!) 146/88 (BP Location: Left Arm, Patient Position: Sitting, Cuff Size: Normal)   Pulse 61   Temp 98 F (36.7 C) (Oral)   Ht 5\' 11"  (1.803 m)   Wt 158 lb (71.7 kg)   SpO2 98%   BMI 22.04 kg/m   BP Readings from Last 3 Encounters:  09/13/19 (!) 146/88  09/10/19 134/84  09/03/19 (!) 149/87    Wt Readings from Last 3 Encounters:  09/13/19 158 lb (71.7 kg)  09/10/19 160 lb (72.6 kg)  09/03/19 163 lb 6.4 oz (74.1 kg)    Physical Exam Constitutional:      General: He is not in acute distress.    Appearance: He is well-developed.     Comments: NAD  Eyes:     Conjunctiva/sclera: Conjunctivae normal.     Pupils: Pupils  are equal, round, and reactive to light.  Neck:     Thyroid: No thyromegaly.     Vascular: No JVD.  Cardiovascular:     Rate and Rhythm: Normal rate and regular rhythm.     Heart sounds: Normal heart sounds. No murmur. No friction rub. No gallop.   Pulmonary:     Effort: Pulmonary effort is normal. No respiratory distress.     Breath sounds: Normal breath sounds. No wheezing or rales.  Chest:     Chest wall: No tenderness.  Abdominal:     General: Bowel sounds are normal. There is no distension.     Palpations: Abdomen is soft. There is no mass.     Tenderness: There is no abdominal tenderness. There is no guarding or rebound.  Musculoskeletal:        General: No tenderness. Normal range of motion.  Cervical back: Normal range of motion.  Lymphadenopathy:     Cervical: No cervical adenopathy.  Skin:    General: Skin is warm and dry.     Findings: No rash.  Neurological:     Mental Status: He is alert and oriented to person, place, and time.     Cranial Nerves: No cranial nerve deficit.     Motor: Weakness present. No abnormal muscle tone.     Coordination: Coordination abnormal.     Gait: Gait abnormal.     Deep Tendon Reflexes: Reflexes are normal and symmetric.  Psychiatric:        Behavior: Behavior normal.        Thought Content: Thought content normal.        Judgment: Judgment normal.   The patient appears chronically ill.  She is stiff.  Her legs are weak.  His first steps are most difficult, then walking gets better.  He is thin  FTF>45 min discussing his multiple problems, declining state of health and options.  Lab Results  Component Value Date   WBC 7.2 08/28/2019   HGB 13.9 08/28/2019   HCT 42.5 08/28/2019   PLT 151 08/28/2019   GLUCOSE 163 (H) 09/04/2019   CHOL 144 08/01/2018   TRIG 167.0 (H) 08/01/2018   HDL 28.20 (L) 08/01/2018   LDLDIRECT 151.5 11/20/2007   LDLCALC 82 08/01/2018   ALT 18 08/28/2019   AST 19 08/28/2019   NA 139 09/04/2019   K  4.7 09/04/2019   CL 105 09/04/2019   CREATININE 1.58 (H) 09/04/2019   BUN 36 (H) 09/04/2019   CO2 22 09/04/2019   TSH 4.76 (H) 10/03/2018   PSA 4.20 (H) 08/01/2018   INR 1.2 08/16/2019   HGBA1C 5.8 (H) 05/04/2019    No results found.  Assessment & Plan:    Follow-up: No follow-ups on file.  [name], MD

## 2019-09-14 ENCOUNTER — Encounter (HOSPITAL_COMMUNITY): Payer: Self-pay | Admitting: Internal Medicine

## 2019-09-14 ENCOUNTER — Inpatient Hospital Stay (HOSPITAL_COMMUNITY): Payer: Medicare Other

## 2019-09-14 ENCOUNTER — Emergency Department (HOSPITAL_COMMUNITY): Payer: Medicare Other

## 2019-09-14 DIAGNOSIS — E860 Dehydration: Secondary | ICD-10-CM | POA: Diagnosis present

## 2019-09-14 DIAGNOSIS — I471 Supraventricular tachycardia: Secondary | ICD-10-CM | POA: Diagnosis not present

## 2019-09-14 DIAGNOSIS — E872 Acidosis: Secondary | ICD-10-CM | POA: Diagnosis not present

## 2019-09-14 DIAGNOSIS — R197 Diarrhea, unspecified: Secondary | ICD-10-CM | POA: Diagnosis present

## 2019-09-14 DIAGNOSIS — G9341 Metabolic encephalopathy: Secondary | ICD-10-CM | POA: Diagnosis not present

## 2019-09-14 DIAGNOSIS — M353 Polymyalgia rheumatica: Secondary | ICD-10-CM | POA: Diagnosis present

## 2019-09-14 DIAGNOSIS — N179 Acute kidney failure, unspecified: Secondary | ICD-10-CM | POA: Diagnosis not present

## 2019-09-14 DIAGNOSIS — Z66 Do not resuscitate: Secondary | ICD-10-CM | POA: Diagnosis present

## 2019-09-14 DIAGNOSIS — C9000 Multiple myeloma not having achieved remission: Secondary | ICD-10-CM | POA: Diagnosis not present

## 2019-09-14 DIAGNOSIS — I2510 Atherosclerotic heart disease of native coronary artery without angina pectoris: Secondary | ICD-10-CM | POA: Diagnosis present

## 2019-09-14 DIAGNOSIS — R570 Cardiogenic shock: Secondary | ICD-10-CM | POA: Diagnosis not present

## 2019-09-14 DIAGNOSIS — G620 Drug-induced polyneuropathy: Secondary | ICD-10-CM | POA: Diagnosis present

## 2019-09-14 DIAGNOSIS — K559 Vascular disorder of intestine, unspecified: Secondary | ICD-10-CM | POA: Diagnosis present

## 2019-09-14 DIAGNOSIS — K5903 Drug induced constipation: Secondary | ICD-10-CM | POA: Diagnosis present

## 2019-09-14 DIAGNOSIS — Z7189 Other specified counseling: Secondary | ICD-10-CM

## 2019-09-14 DIAGNOSIS — I712 Thoracic aortic aneurysm, without rupture: Secondary | ICD-10-CM | POA: Diagnosis present

## 2019-09-14 DIAGNOSIS — N189 Chronic kidney disease, unspecified: Secondary | ICD-10-CM | POA: Diagnosis not present

## 2019-09-14 DIAGNOSIS — I340 Nonrheumatic mitral (valve) insufficiency: Secondary | ICD-10-CM | POA: Diagnosis not present

## 2019-09-14 DIAGNOSIS — R55 Syncope and collapse: Secondary | ICD-10-CM | POA: Diagnosis not present

## 2019-09-14 DIAGNOSIS — A419 Sepsis, unspecified organism: Secondary | ICD-10-CM | POA: Diagnosis present

## 2019-09-14 DIAGNOSIS — I10 Essential (primary) hypertension: Secondary | ICD-10-CM | POA: Diagnosis not present

## 2019-09-14 DIAGNOSIS — I4891 Unspecified atrial fibrillation: Secondary | ICD-10-CM | POA: Diagnosis not present

## 2019-09-14 DIAGNOSIS — I480 Paroxysmal atrial fibrillation: Secondary | ICD-10-CM | POA: Diagnosis not present

## 2019-09-14 DIAGNOSIS — I5031 Acute diastolic (congestive) heart failure: Secondary | ICD-10-CM | POA: Diagnosis not present

## 2019-09-14 DIAGNOSIS — Z20822 Contact with and (suspected) exposure to covid-19: Secondary | ICD-10-CM | POA: Diagnosis present

## 2019-09-14 DIAGNOSIS — R1084 Generalized abdominal pain: Secondary | ICD-10-CM | POA: Diagnosis not present

## 2019-09-14 DIAGNOSIS — E785 Hyperlipidemia, unspecified: Secondary | ICD-10-CM | POA: Diagnosis present

## 2019-09-14 DIAGNOSIS — C7951 Secondary malignant neoplasm of bone: Secondary | ICD-10-CM | POA: Diagnosis present

## 2019-09-14 DIAGNOSIS — K760 Fatty (change of) liver, not elsewhere classified: Secondary | ICD-10-CM | POA: Diagnosis present

## 2019-09-14 DIAGNOSIS — I5033 Acute on chronic diastolic (congestive) heart failure: Secondary | ICD-10-CM | POA: Diagnosis present

## 2019-09-14 DIAGNOSIS — F05 Delirium due to known physiological condition: Secondary | ICD-10-CM | POA: Diagnosis not present

## 2019-09-14 DIAGNOSIS — I110 Hypertensive heart disease with heart failure: Secondary | ICD-10-CM | POA: Diagnosis present

## 2019-09-14 DIAGNOSIS — R6520 Severe sepsis without septic shock: Secondary | ICD-10-CM | POA: Diagnosis present

## 2019-09-14 LAB — URINALYSIS, ROUTINE W REFLEX MICROSCOPIC
Bilirubin Urine: NEGATIVE
Glucose, UA: NEGATIVE mg/dL
Hgb urine dipstick: NEGATIVE
Ketones, ur: NEGATIVE mg/dL
Leukocytes,Ua: NEGATIVE
Nitrite: NEGATIVE
Protein, ur: NEGATIVE mg/dL
Specific Gravity, Urine: 1.019 (ref 1.005–1.030)
pH: 5 (ref 5.0–8.0)

## 2019-09-14 LAB — CBC WITH DIFFERENTIAL/PLATELET
Abs Immature Granulocytes: 0.04 10*3/uL (ref 0.00–0.07)
Basophils Absolute: 0 10*3/uL (ref 0.0–0.1)
Basophils Relative: 0 %
Eosinophils Absolute: 0 10*3/uL (ref 0.0–0.5)
Eosinophils Relative: 1 %
HCT: 50.8 % (ref 39.0–52.0)
Hemoglobin: 16.9 g/dL (ref 13.0–17.0)
Immature Granulocytes: 1 %
Lymphocytes Relative: 14 %
Lymphs Abs: 0.8 10*3/uL (ref 0.7–4.0)
MCH: 30.4 pg (ref 26.0–34.0)
MCHC: 33.3 g/dL (ref 30.0–36.0)
MCV: 91.4 fL (ref 80.0–100.0)
Monocytes Absolute: 0 10*3/uL — ABNORMAL LOW (ref 0.1–1.0)
Monocytes Relative: 1 %
Neutro Abs: 5.1 10*3/uL (ref 1.7–7.7)
Neutrophils Relative %: 83 %
Platelets: 173 10*3/uL (ref 150–400)
RBC: 5.56 MIL/uL (ref 4.22–5.81)
RDW: 12.6 % (ref 11.5–15.5)
WBC: 6.1 10*3/uL (ref 4.0–10.5)
nRBC: 0 % (ref 0.0–0.2)

## 2019-09-14 LAB — POC SARS CORONAVIRUS 2 AG -  ED: SARS Coronavirus 2 Ag: NEGATIVE

## 2019-09-14 LAB — COMPREHENSIVE METABOLIC PANEL
ALT: 29 U/L (ref 0–44)
AST: 39 U/L (ref 15–41)
Albumin: 5.2 g/dL — ABNORMAL HIGH (ref 3.5–5.0)
Alkaline Phosphatase: 126 U/L (ref 38–126)
Anion gap: 19 — ABNORMAL HIGH (ref 5–15)
BUN: 44 mg/dL — ABNORMAL HIGH (ref 8–23)
CO2: 19 mmol/L — ABNORMAL LOW (ref 22–32)
Calcium: 10.5 mg/dL — ABNORMAL HIGH (ref 8.9–10.3)
Chloride: 102 mmol/L (ref 98–111)
Creatinine, Ser: 2.02 mg/dL — ABNORMAL HIGH (ref 0.61–1.24)
GFR calc Af Amer: 35 mL/min — ABNORMAL LOW (ref >60)
GFR calc non Af Amer: 30 mL/min — ABNORMAL LOW (ref >60)
Glucose, Bld: 192 mg/dL — ABNORMAL HIGH (ref 70–99)
Potassium: 3.7 mmol/L (ref 3.5–5.1)
Sodium: 140 mmol/L (ref 135–145)
Total Bilirubin: 1.6 mg/dL — ABNORMAL HIGH (ref 0.3–1.2)
Total Protein: 7.4 g/dL (ref 6.5–8.1)

## 2019-09-14 LAB — CBC
HCT: 47.4 % (ref 39.0–52.0)
HCT: 44.2 % (ref 39.0–52.0)
HCT: 42.7 % (ref 39.0–52.0)
Hemoglobin: 15.5 g/dL (ref 13.0–17.0)
Hemoglobin: 14.9 g/dL (ref 13.0–17.0)
Hemoglobin: 14.5 g/dL (ref 13.0–17.0)
MCH: 30.1 pg (ref 26.0–34.0)
MCH: 30.2 pg (ref 26.0–34.0)
MCH: 30.4 pg (ref 26.0–34.0)
MCHC: 32.7 g/dL (ref 30.0–36.0)
MCHC: 33.7 g/dL (ref 30.0–36.0)
MCHC: 34 g/dL (ref 30.0–36.0)
MCV: 92 fL (ref 80.0–100.0)
MCV: 89.7 fL (ref 80.0–100.0)
MCV: 89.5 fL (ref 80.0–100.0)
Platelets: 112 10*3/uL — ABNORMAL LOW (ref 150–400)
Platelets: 117 10*3/uL — ABNORMAL LOW (ref 150–400)
Platelets: 93 10*3/uL — ABNORMAL LOW (ref 150–400)
RBC: 5.15 MIL/uL (ref 4.22–5.81)
RBC: 4.93 MIL/uL (ref 4.22–5.81)
RBC: 4.77 MIL/uL (ref 4.22–5.81)
RDW: 12.7 % (ref 11.5–15.5)
RDW: 12.8 % (ref 11.5–15.5)
RDW: 12.8 % (ref 11.5–15.5)
WBC: 15.8 10*3/uL — ABNORMAL HIGH (ref 4.0–10.5)
WBC: 15.4 10*3/uL — ABNORMAL HIGH (ref 4.0–10.5)
WBC: 12.2 10*3/uL — ABNORMAL HIGH (ref 4.0–10.5)
nRBC: 0 % (ref 0.0–0.2)
nRBC: 0 % (ref 0.0–0.2)
nRBC: 0 % (ref 0.0–0.2)

## 2019-09-14 LAB — C DIFFICILE QUICK SCREEN W PCR REFLEX
C Diff antigen: NEGATIVE
C Diff interpretation: NOT DETECTED
C Diff toxin: NEGATIVE

## 2019-09-14 LAB — GLUCOSE, CAPILLARY: Glucose-Capillary: 115 mg/dL — ABNORMAL HIGH (ref 70–99)

## 2019-09-14 LAB — POC OCCULT BLOOD, ED: Fecal Occult Bld: POSITIVE — AB

## 2019-09-14 LAB — RESPIRATORY PANEL BY RT PCR (FLU A&B, COVID)
Influenza A by PCR: NEGATIVE
Influenza B by PCR: NEGATIVE
SARS Coronavirus 2 by RT PCR: NEGATIVE

## 2019-09-14 LAB — PROCALCITONIN: Procalcitonin: 48.04 ng/mL

## 2019-09-14 LAB — LACTIC ACID, PLASMA
Lactic Acid, Venous: 6.1 mmol/L (ref 0.5–1.9)
Lactic Acid, Venous: 3.5 mmol/L (ref 0.5–1.9)
Lactic Acid, Venous: 1.7 mmol/L (ref 0.5–1.9)
Lactic Acid, Venous: 1.5 mmol/L (ref 0.5–1.9)

## 2019-09-14 LAB — PROTIME-INR
INR: 1.5 — ABNORMAL HIGH (ref 0.8–1.2)
Prothrombin Time: 17.6 seconds — ABNORMAL HIGH (ref 11.4–15.2)

## 2019-09-14 MED ORDER — ACYCLOVIR 400 MG PO TABS
400.0000 mg | ORAL_TABLET | Freq: Two times a day (BID) | ORAL | Status: DC
  Administered 2019-09-15 – 2019-09-21 (×12): 400 mg via ORAL
  Filled 2019-09-14 (×16): qty 1

## 2019-09-14 MED ORDER — ACETAMINOPHEN 650 MG RE SUPP: 650.0000 mg | Freq: Four times a day (QID) | RECTAL | Status: DC | PRN

## 2019-09-14 MED ORDER — SODIUM CHLORIDE 0.9 % IV BOLUS
500.0000 mL | Freq: Once | INTRAVENOUS | Status: AC
  Administered 2019-09-14: 500 mL via INTRAVENOUS

## 2019-09-14 MED ORDER — METOPROLOL TARTRATE 5 MG/5ML IV SOLN
5.0000 mg | Freq: Two times a day (BID) | INTRAVENOUS | Status: DC
  Administered 2019-09-14 – 2019-09-15 (×2): 5 mg via INTRAVENOUS
  Filled 2019-09-14 (×2): qty 5

## 2019-09-14 MED ORDER — VANCOMYCIN HCL 1500 MG/300ML IV SOLN
1500.0000 mg | INTRAVENOUS | Status: DC
  Filled 2019-09-14: qty 300

## 2019-09-14 MED ORDER — ACETAMINOPHEN 325 MG PO TABS
650.0000 mg | ORAL_TABLET | Freq: Four times a day (QID) | ORAL | Status: DC | PRN
  Administered 2019-09-20: 650 mg via ORAL
  Filled 2019-09-14: qty 2

## 2019-09-14 MED ORDER — LACTATED RINGERS IV SOLN: INTRAVENOUS | Status: DC

## 2019-09-14 MED ORDER — FENTANYL 12 MCG/HR TD PT72
1.0000 | MEDICATED_PATCH | TRANSDERMAL | Status: DC
  Administered 2019-09-17 – 2019-09-20 (×2): 1 via TRANSDERMAL
  Filled 2019-09-14 (×2): qty 1

## 2019-09-14 MED ORDER — FENTANYL CITRATE (PF) 100 MCG/2ML IJ SOLN
25.0000 ug | INTRAMUSCULAR | Status: DC | PRN
  Administered 2019-09-16 – 2019-09-21 (×8): 25 ug via INTRAVENOUS
  Filled 2019-09-14 (×8): qty 2

## 2019-09-14 MED ORDER — SODIUM CHLORIDE 0.9 % IV SOLN
2.0000 g | Freq: Two times a day (BID) | INTRAVENOUS | Status: DC
  Administered 2019-09-14 – 2019-09-19 (×10): 2 g via INTRAVENOUS
  Filled 2019-09-14 (×11): qty 2

## 2019-09-14 MED ORDER — ONDANSETRON HCL 4 MG/2ML IJ SOLN: 4.0000 mg | Freq: Four times a day (QID) | INTRAMUSCULAR | Status: DC | PRN

## 2019-09-14 MED ORDER — SODIUM CHLORIDE 0.9% FLUSH
3.0000 mL | Freq: Two times a day (BID) | INTRAVENOUS | Status: DC
  Administered 2019-09-14 – 2019-09-21 (×13): 3 mL via INTRAVENOUS

## 2019-09-14 MED ORDER — ONDANSETRON HCL 4 MG PO TABS: 4.0000 mg | ORAL_TABLET | Freq: Four times a day (QID) | ORAL | Status: DC | PRN

## 2019-09-14 MED ORDER — VANCOMYCIN HCL 1500 MG/300ML IV SOLN
1500.0000 mg | Freq: Once | INTRAVENOUS | Status: AC
  Administered 2019-09-14: 1500 mg via INTRAVENOUS
  Filled 2019-09-14: qty 300

## 2019-09-14 MED ORDER — LORAZEPAM 2 MG/ML IJ SOLN
1.0000 mg | INTRAMUSCULAR | Status: DC | PRN
  Administered 2019-09-16 – 2019-09-17 (×4): 1 mg via INTRAVENOUS
  Filled 2019-09-14 (×5): qty 1

## 2019-09-14 MED ORDER — SODIUM CHLORIDE 0.9 % IV SOLN
2.0000 g | Freq: Once | INTRAVENOUS | Status: AC
  Administered 2019-09-14: 2 g via INTRAVENOUS
  Filled 2019-09-14: qty 2

## 2019-09-14 MED ORDER — SODIUM CHLORIDE 0.9 % IV SOLN: Freq: Once | INTRAVENOUS | Status: DC

## 2019-09-14 NOTE — Plan of Care (Signed)
  Problem: Education: Goal: Knowledge of General Education information will improve Description Including pain rating scale, medication(s)/side effects and non-pharmacologic comfort measures Outcome: Progressing   

## 2019-09-14 NOTE — ED Notes (Signed)
Pt to CT with tech.

## 2019-09-14 NOTE — Progress Notes (Signed)
Pt arrived to unit via bed. Pt placed in bed, cleaned up and oriented to room. Pt with a low grade temp of 99 axillary. Provider was notified. SCDs were placed on pt. Will continue to monitor pt and await further orders. Pt laying in bed in no apparent distress at this time. Wife at bedside.

## 2019-09-14 NOTE — H&P (Signed)
History and Physical    [name] [name] NWG:[id number] DOB: Jun 10, 1941 DOA: 09/13/2019  PCP: Cassandria Anger, MD Consultants:  Posey Pronto - neurology; Irene Limbo - oncology; Angelena Form - cardiology Patient coming from:  Home - lives with wife; NOK: Wife, Hubbell, [phone], [phone]  Chief Complaint: abdominal pain  HPI: [name] [name] is a 79 y.o. male with medical history significant of afib on Eliquis; HTN; HLD; chronic diastolic CHF; s/p TAVR; and multiple myeloma with metastatic disease presenting with abdominal pain.  He is unable to provide history at this time.  RN reports copious frankly bloody diarrhea.  I spoke with his wife.  She reports that he was diagnosed with bone cancer in August.  They found it while undergoing work-up for TAVR.  He was diagnosed with multiple myeloma, was undergoing infusions, none in 2-3 weeks.  He has lost a lot of weight.  Last night, he was vomiting profusely, cold/sweaty, hard of breathing.  He started acutely last night, and developed diarrhea about a 30 minutes later.  No fevers.  He was alert, asked her to call 911.  He is usually a light sleeper but has not been sleeping lately due to excruciating pain in his legs.  He takes oxycodone at night, has been sleeping in the afternoons.  He hit his head night before last - he told her yesterday AM that he lost balance and fell and hit head.  His is on blood thinners due to h/o afib, on Eliquis.  A year ago, he had 30-40 mm polyp removed from his colon at [facility] - sounds like at the transverse colon to sigmoid junction.  They were told that the cancer will not resolve but may be able to reach remission; last time they were told it might be getting worse.      ED Course:  Carryover, per Dr. Hal Hope:  78 year old male with history of multiple myeloma A. fib presents with severe diarrhea had a CT abdomen with actually shows stool impaction. Lactic acid is 6 blood. The ER patient does not look septic. Getting  fluid bolus for hydration elevated creatinine from baseline and ER physician had done stool disimpaction following which he had more diarrhea. Admitted for diarrhea with stool impaction and acute renal failure generalized weakness stool impaction which was disimpacted. Stool occult blood was positive but ER physician states he has no obvious blood. Covid test was negative.  Review of Systems: Unable to perform    Past Medical History:  Diagnosis Date  . Ascending aortic aneurysm ([place])   . Bicuspid aortic valve   . Cancer ([place])   . CHF NYHA class I (no symptoms from ordinary activities), acute, diastolic ([place])   . Fatty liver    mild  . GI bleeding 07/21/2018   post polypectomy  . Hemorrhoids   . HTN (hypertension)   . Hypercholesteremia   . Hypokalemia   . Internal hemorrhoids   . LBP (low back pain)   . Moderate aortic stenosis   . Osteoarthritis   . Paroxysmal atrial fibrillation ([place])    a. new onset Afib in 07/2008. He underwent ibutilide cardioversion successfully. b. Recurrence 01/2013 s/p TEE/DCCV - was on Xarelto but he stopped it as he was convinced it was causing joint pn. c. Recurrence 01/2016 - spont conv to NSR. Pt took Eliquis x1 mo then declined further anticoag. d. Recurrence 07/2016.  [name] Pneumonia   . Tubular adenoma of colon     Past Surgical History:  Procedure Laterality Date  .  BACK SURGERY  x12 years ago  . CARDIOVERSION N/A 01/26/2013   Procedure: CARDIOVERSION;  Surgeon: Larey Dresser, MD;  Location: [facility] ENDOSCOPY;  Service: Cardiovascular;  Laterality: N/A;  . CARDIOVERSION N/A 10/28/2017   Procedure: CARDIOVERSION;  Surgeon: Larey Dresser, MD;  Location: [facility] ENDOSCOPY;  Service: Cardiovascular;  Laterality: N/A;  . CARDIOVERSION N/A 03/03/2018   Procedure: CARDIOVERSION;  Surgeon: Lelon Perla, MD;  Location: [facility] ENDOSCOPY;  Service: Cardiovascular;  Laterality: N/A;  . COLONOSCOPY    . COLONOSCOPY  07/17/2018   at [facility]  . HEMORRHOID SURGERY    .  LUMBAR LAMINECTOMY    . POLYPECTOMY    . RIGHT HEART CATH N/A 08/20/2019   Procedure: RIGHT HEART CATH;  Surgeon: Larey Dresser, MD;  Location: [place] CV LAB;  Service: Cardiovascular;  Laterality: N/A;  . RIGHT/LEFT HEART CATH AND CORONARY ANGIOGRAPHY N/A 03/07/2019   Procedure: RIGHT/LEFT HEART CATH AND CORONARY ANGIOGRAPHY;  Surgeon: Burnell Blanks, MD;  Location: [place] CV LAB;  Service: Cardiovascular;  Laterality: N/A;  . TEE WITHOUT CARDIOVERSION N/A 01/26/2013   Procedure: TRANSESOPHAGEAL ECHOCARDIOGRAM (TEE);  Surgeon: Larey Dresser, MD;  Location: [place];  Service: Cardiovascular;  Laterality: N/A;  . TEE WITHOUT CARDIOVERSION N/A 10/28/2017   Procedure: TRANSESOPHAGEAL ECHOCARDIOGRAM (TEE);  Surgeon: Larey Dresser, MD;  Location: [facility] ENDOSCOPY;  Service: Cardiovascular;  Laterality: N/A;  . TEE WITHOUT CARDIOVERSION N/A 05/08/2019   Procedure: TRANSESOPHAGEAL ECHOCARDIOGRAM (TEE);  Surgeon: Burnell Blanks, MD;  Location: [place] CV LAB;  Service: Open Heart Surgery;  Laterality: N/A;  . TRANSCATHETER AORTIC VALVE REPLACEMENT, TRANSFEMORAL N/A 05/08/2019   Procedure: TRANSCATHETER AORTIC VALVE REPLACEMENT, TRANSFEMORAL;  Surgeon: Burnell Blanks, MD;  Location: [place] CV LAB;  Service: Open Heart Surgery;  Laterality: N/A;    Social History   Socioeconomic History  . Marital status: Married    Spouse name: Not on file  . Number of children: 0  . Years of education: Not on file  . Highest education level: Not on file  Occupational History  . Occupation: Retired Lobbyist: [employer]  Tobacco Use  . Smoking status: Never Smoker  . Smokeless tobacco: Never Used  Substance and Sexual Activity  . Alcohol use: Yes    Comment: Drinks 1 glass of wine nightly/socially  . Drug use: No  . Sexual activity: Yes  Other Topics Concern  . Not on file  Social History Narrative   Patient lives in [place] w/ his  wife. He is a native of [place]. 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   Right-handed   Caffeine: 2 cups coffee per day   Two story home   Social Determinants of Health   Financial Resource Strain:   . Difficulty of Paying Living Expenses: Not on file  Food Insecurity:   . Worried About Charity fundraiser in the Last Year: Not on file  . Ran Out of Food in the Last Year: Not on file  Transportation Needs:   . Lack of Transportation (Medical): Not on file  . Lack of Transportation (Non-Medical): Not on file  Physical Activity:   . Days of Exercise per Week: Not on file  . Minutes of Exercise per Session: Not on file  Stress:   . Feeling of Stress : Not on file  Social Connections:   . Frequency of Communication with Friends and Family: Not on  file  . Frequency of Social Gatherings with Friends and Family: Not on file  . Attends Religious Services: Not on file  . Active Member of Clubs or Organizations: Not on file  . Attends Archivist Meetings: Not on file  . Marital Status: Not on file  Intimate Partner Violence:   . Fear of Current or Ex-Partner: Not on file  . Emotionally Abused: Not on file  . Physically Abused: Not on file  . Sexually Abused: Not on file    Allergies  Allergen Reactions  . Xarelto [Rivaroxaban] Other (See Comments) and Hypertension    INCREASED BP-HYPERTENSIVE EVENTS  . Corticosteroids Other (See Comments)    Made the patient  "sick," feel "weird," and his "body rejected" them   . Ramipril Other (See Comments)    Could not eat or sleep, lost muscle mass  . Benazepril Cough    Family History  Problem Relation Age of Onset  . Colon cancer Mother 34  . Hypertension Other   . Coronary artery disease Neg Hx   . Colon polyps Neg Hx   . Esophageal cancer Neg Hx   . Rectal cancer Neg Hx   . Stomach cancer Neg Hx     Prior to Admission medications   Medication Sig Start Date End Date Taking?  Authorizing Provider  acyclovir (ZOVIRAX) 400 MG tablet Take 1 tablet (400 mg total) by mouth 2 (two) times daily. 08/15/19   Brunetta Genera, MD  amLODipine (NORVASC) 5 MG tablet Take 1 tablet (5 mg total) by mouth daily. 05/16/19 05/10/20  Eileen Stanford, PA-C  amoxicillin (AMOXIL) 500 MG capsule amoxicillin 500 mg capsule    [provider]  Carboxymethylcellul-Glycerin (LUBRICATING EYE DROPS OP) Place 1 drop into both eyes daily as needed (dry eyes).    [provider]  Cholecalciferol (VITAMIN D) 50 MCG (2000 UT) tablet Take 2,000 Units by mouth daily.    [provider]  dexamethasone (DECADRON) 4 MG tablet Take 3 tablets (12 mg total) by mouth once a week. Patient taking differently: Take 20 mg by mouth every Monday.  06/12/19   Brunetta Genera, MD  diclofenac Sodium (VOLTAREN) 1 % GEL Apply 1 application topically 4 (four) times daily as needed (pain).    [provider]  DULoxetine (CYMBALTA) 60 MG capsule Take 1 capsule (60 mg total) by mouth daily. 08/15/19   Brunetta Genera, MD  ELIQUIS 5 MG TABS tablet TAKE 1 TABLET BY MOUTH TWICE A DAY Patient taking differently: Take 5 mg by mouth 2 (two) times daily.  07/09/19   Larey Dresser, MD  ergocalciferol (VITAMIN D2) 1.25 MG (50000 UT) capsule Take 1 capsule (50,000 Units total) by mouth once a week. 07/03/19   Brunetta Genera, MD  famotidine (PEPCID) 40 MG tablet Take 1 tablet (40 mg total) by mouth daily. 01/29/19   [name], [name], MD  fentaNYL (DURAGESIC) 12 MCG/HR Place 1 patch onto the skin every 3 (three) days. 08/15/19   Brunetta Genera, MD  furosemide (LASIX) 20 MG tablet Take '40mg'$  (2 tabs) daily alternating with '20mg'$  (1 tab) daily every other day 08/28/19   Larey Dresser, MD  gabapentin (NEURONTIN) 300 MG capsule Take 1 in the morning and 2 tablet at bedtime. 09/10/19   Patel, Arvin Collard K, DO  lenalidomide (REVLIMID) 15 MG capsule Take 1 capsule (15 mg total) by mouth  daily. Take for 14 days on, 7 days off, repeat every 21 days. Patient  taking differently: Take 15 mg by mouth See admin instructions. Take 15 mg daily for 14 days on, 7 days off, repeat every 21 days. 07/05/19   Brunetta Genera, MD  loratadine (CLARITIN) 10 MG tablet Take 1 tablet (10 mg total) by mouth daily. 12/11/18   [name], [name], MD  LORazepam (ATIVAN) 0.5 MG tablet Take 1 tablet (0.5 mg total) by mouth every 6 (six) hours as needed (Nausea or vomiting). 05/17/19   Brunetta Genera, MD  metoprolol succinate (TOPROL-XL) 25 MG 24 hr tablet Take 1 tablet (25 mg total) by mouth 2 (two) times daily. 06/22/19   Larey Dresser, MD  ondansetron (ZOFRAN) 8 MG tablet Take 1 tablet (8 mg total) by mouth 2 (two) times daily as needed (Nausea or vomiting). 05/17/19   Brunetta Genera, MD  oxyCODONE (OXY IR/ROXICODONE) 5 MG immediate release tablet Take 1-2 tablets (5-10 mg total) by mouth every 4 (four) hours as needed for moderate pain or severe pain. 08/15/19   Brunetta Genera, MD  potassium chloride SA (KLOR-CON M20) 20 MEQ tablet Take 2 tablets (40 mEq total) by mouth 2 (two) times daily. 08/15/19   Brunetta Genera, MD  prochlorperazine (COMPAZINE) 10 MG tablet Take 1 tablet (10 mg total) by mouth every 6 (six) hours as needed (Nausea or vomiting). 05/17/19   Brunetta Genera, MD  senna-docusate (SENNA S) 8.6-50 MG tablet Take 2 tablets by mouth at bedtime. Patient taking differently: Take 2 tablets by mouth at bedtime as needed for mild constipation.  06/19/19   Brunetta Genera, MD  spironolactone (ALDACTONE) 50 MG tablet Take 1 tablet (50 mg total) by mouth daily. 08/16/19 11/14/19  Larey Dresser, MD    Physical Exam: Vitals:   09/14/19 0654 09/14/19 0745 09/14/19 0836 09/14/19 0915  BP: 105/71 113/65 106/72 94/70  Pulse: (!) 113 (!) 108 (!) 103 (!) 101  Resp: (!) 23 (!) 21 20 (!) 21  Temp:      TempSrc:      SpO2: 92% 97% 95% 93%  Weight:      Height:          . General:  Appears calm and comfortable but ill, minimally responsive (only to foot palpation) . Eyes:  PERRL (minimally), normal lids, iris . ENT:  normal lips & tongue, mildly dry mm; snoring softly . Neck:  no LAD, masses or thyromegaly . Cardiovascular:  RR with tachycardia, no m/r/g. No LE edema.  [name] Respiratory:   CTA bilaterally with no wheezes/rales/rhonchi.  Mildly increased respiratory effort. . Abdomen:  soft, NT, ND, NABS . Skin:  no rash or induration seen on limited exam . Musculoskeletal:  grossly normal tone BUE/BLE, good ROM, no bony abnormality . Psychiatric:  Obtunded, minimally responsive to pain, GCS 7 . Neurologic:  Unable to perform    Radiological Exams on Admission: CT ABDOMEN PELVIS WO CONTRAST  Result Date: 09/14/2019 CLINICAL DATA:  Abdominal pain. Nausea, vomiting, diarrhea and liquid stool. History of multiple myeloma. EXAM: CT ABDOMEN AND PELVIS WITHOUT CONTRAST TECHNIQUE: Multidetector CT imaging of the abdomen and pelvis was performed following the standard protocol without IV contrast. COMPARISON:  CT angiography 03/14/2019. PET CT 04/06/2019 FINDINGS: Lower chest: Dilated distal esophagus containing enteric contrast. Minimal scarring in the right middle lobe and lingula. Minimal dependent atelectasis in the left lower lobe. No pleural fluid. Hepatobiliary: No focal liver lesion on noncontrast exam. Mild gallbladder distention without calcified gallstone or biliary dilatation. No pericholecystic inflammation. Pancreas:  Parenchymal atrophy. No ductal dilatation or inflammation. Spleen: Normal in size without focal abnormality. Adrenals/Urinary Tract: Normal adrenal glands. No hydronephrosis or perinephric edema. Bilateral renal cysts. Ureters are decompressed. Urinary bladder is partially distended. Bladder displaced anteriorly by a stool ball distending the rectum. Stomach/Bowel: Enteric contrast in the distal esophagus which is dilated. Distended stomach  with air and oral contrast. There is no gastric wall thickening. No evidence of gastric outlet obstruction or mass effect. No small bowel obstruction or dilatation, administered enteric contrast reaches the cecum. Appendix not well visualized, no evidence of appendicitis. Small volume of formed stool in the ascending colon, liquid stool in the transverse and descending colon. Stool within tortuous sigmoid colon. Sigmoid colonic wall thickening, series 3, image 62. Large stool ball distends the rectum with rectal distention of 8 cm. Mild perirectal fat stranding. No evidence of pneumatosis or perforation. Vascular/Lymphatic: Aorta bi-iliac atherosclerosis. Decreased size of previous retroperitoneal lymph node anterior to the IVC since prior PET, no longer visualized. No enlarged lymph nodes in the abdomen or pelvis. Reproductive: Prostate is unremarkable. Other: No ascites. No free air. Fat in the left inguinal canal. Musculoskeletal: Multiple bone lesions involving the pelvis, largest lesion with lytic in the right acetabulum. Overall no significant change in size from prior exam. Additional small bone lesions involving ribs. Stable endplate concavity at A26 and T12. No new osseous lesions. IMPRESSION: 1. Large stool ball distending the rectum with rectal distention and mild perirectal fat stranding. Findings consistent with fecal impaction. There is also wall thickening of the adjacent sigmoid colon with mild pericolonic edema. Possibility of stercoral colitis is raised. 2. Findings suggesting gastroparesis with gastric distension with air and contrast, as well as distended distal esophagus that is contrast filled. No evidence of gastric outlet obstruction or mechanical obstructing lesion. No bowel obstruction, administered enteric contrast reaches the cecum. 3. Decreased size of previous retroperitoneal lymph node since prior PET, no longer visualized. 4. Multiple pelvic bone lesions consistent with history of  multiple myeloma, grossly unchanged in size from prior exam. Aortic Atherosclerosis (ICD10-I70.0). Electronically Signed   By: Keith Rake M.D.   On: 09/14/2019 04:05   CT HEAD WO CONTRAST  Result Date: 09/14/2019 CLINICAL DATA:  Presyncope EXAM: CT HEAD WITHOUT CONTRAST TECHNIQUE: Contiguous axial images were obtained from the base of the skull through the vertex without intravenous contrast. COMPARISON:  MRI 02/17/2017 FINDINGS: Brain: No evidence of acute infarction, hemorrhage, hydrocephalus, extra-axial collection or mass lesion/mass effect. Scattered low-density changes within the periventricular and subcortical white matter compatible with chronic microvascular ischemic change. Mild diffuse cerebral volume loss. Vascular: Mild atherosclerotic calcifications involving the large vessels of the skull base. No unexpected hyperdense vessel. Skull: Normal. Negative for fracture or focal lesion. Sinuses/Orbits: No acute finding. Other: None. IMPRESSION: 1.  No acute intracranial findings. 2.  Chronic microvascular ischemic change and cerebral volume loss. Electronically Signed   By: Davina Poke D.O.   On: 09/14/2019 09:59   DG Chest Port 1 View  Result Date: 09/14/2019 CLINICAL DATA:  Weakness. EXAM: PORTABLE CHEST 1 VIEW COMPARISON:  Radiograph 05/09/2019 FINDINGS: Normal heart size and mediastinal contours. TAVR. No pulmonary edema, focal airspace disease, pleural effusion or pneumothorax. No acute osseous abnormalities are seen. IMPRESSION: No acute chest findings. Electronically Signed   By: Keith Rake M.D.   On: 09/14/2019 02:23    EKG: not done   Labs on Admission: I have personally reviewed the available labs and imaging studies at the time of the admission.  Pertinent labs:   CO2 19 Glucose 192 BUN 44/Creatinine 2.02/GFR 30; 36/1.58/41 on 12/22 Ca++ 10.5; 9.5 on 12/22 Anion gap 19 Bili 1.6 Lactate 6.1 -> 3.5 Normal CBC Negative respiratory panel and BD COVID Heme  positive   Assessment/Plan Principal Problem:   Abdominal pain, diffuse Active Problems:   Essential hypertension   PAF (paroxysmal atrial fibrillation) (HCC)   Acute kidney injury superimposed on CKD (HCC)   S/P TAVR (transcatheter aortic valve replacement)   Multiple myeloma not having achieved remission (HCC)   Bone metastases (HCC)   Peripheral neuropathy   Bloody diarrhea   Acute metabolic encephalopathy   Goals of care, counseling/discussion   DNR (do not resuscitate)    Abdominal pain with bloody diarrhea -Patient presenting with abdominal pain - initially at home with vomiting and then diarrhea -Imaging showed a large fecal impaction which was manually disimpacted in the ER - very likely associated with chronic opiate use -Diarrhea was thought to be secondary to overflow and he has continued to have significant diarrhea following disimpaction -He was initially with brown stool and heme positive but it has transitioned to frankly positive -This may be associated with a stercoral ulcer -C diff testing done and is negative; GI pathogen panel is pending -Will continue to follow with q6h CBC -For now, will defer call to GI but will call if Hgb is significantly downtrending -Markedly elevated lactate with SIRS criteria including tachycardia and tachypnea; not clearly sepsis syndrome at this time but will continue Vanc/Cefepime empirically while cultures are pending -Continue to follow lactate, which appears more likely elevated due to AKI/dehydration and GI bleeding -Will admit for close ongoing monitoring  AMS -Patient is now obtunded -This appears to be likely related to underlying dehydration and exhaustion -Head CT ordered given that patient is on [facility] and had a fall - negative for apparent bleed -Will monitor in Progressive Care Unit for now  AKI on CKD -12/15 creatinine 1.52, GFR 43; currently 2.02/30 -This is likely related to decreased PO intake in the setting of  severe (likely opiate-associated) constipation leading to n/v/d -Will hydrate and follow  Multiple myeloma/plasma cell myeloma -Followed by Dr. Irene Limbo, last seen 12/21 -lytic bone lesions, with occasional hip pain that prohibits him from walking -Hypercalcemia, on Zometa; probably slightly worse with AKI; will follow after rehydration -I spoke with Dr. Maylon Peppers, who recommends a couple of days of supportive care -Oncology can consult after that if he doesn't improve -Takes weekly Decadron -Continue Acyclovir (when able to take PO)  S/p TAVR, Afib on Eliquis, Chronic diastolic CHF, HTN -7/51/02 TAVR -For now will hold Eliquis given his GI bleeding -Hold Lasix and spironolactone given AKI -Holding PO meds, so will give low-dose IV Lopressor q12h -Hold Norvasc  Peripheral neuropathy -Painful - this was the only time the patient showed a clear response on PE -This is thought to be associated with prior chemotherapy (Velcade, Revlimid) -There is no treatment available -Chemotherapy has been stopped and these symptoms should slowly start to improve -On Oxycodone, Cymbalta, Neurontin for this issue - holding PO meds for now  Goals of care -The patient has metastatic multiple myeloma for which he is not receiving treatment at this time -He has weight loss and FTT -While this may be reversible in association with his apparent severe constipation, it is possible that this is also a terminal condition -His wife is aware that his prognosis is guarded -She has confirmed that he would not want to be kept alive by  artificial means and so he is DNR    Note: This patient has been tested and is negative for the novel coronavirus COVID-19.    DVT prophylaxis:  SCDs Code Status:  DNR - confirmed with family Family Communication:  None present; I spoke with his wife at length by telephone and she wants to be notified as soon as he is in a room so that she can come and be with him  Disposition Plan: To  be determined Consults called: Oncology (telephone only) Admission status: Admit - It is my clinical opinion that admission to INPATIENT is reasonable and necessary because of the expectation that this patient will require hospital care that crosses at least 2 midnights to treat this condition based on the medical complexity of the problems presented.  Given the aforementioned information, the predictability of an adverse outcome is felt to be significant.    Karmen Bongo MD [facility]   How to contact the [facility] Attending or Consulting provider [name] or covering provider during after hours [place], for this patient?  1. Check the care team in [facility] and look for a) attending/consulting [facility] provider listed and b) the [facility] team listed 2. Log into [email or web address] and use [place]'s universal password to access. If you do not have the password, please contact the hospital operator. 3. Locate the [facility] provider you are looking for under [facility] and page to a number that you can be directly reached. 4. If you still have difficulty reaching the provider, please page the [facility] (Director on Call) for the Hospitalists listed on amion for assistance.   09/14/2019, 10:37 AM

## 2019-09-14 NOTE — ED Notes (Signed)
Pt's O2 increased to 4L [place] for comfort

## 2019-09-14 NOTE — ED Notes (Signed)
MD made aware of elevated lactic acid, to order CT & XRay

## 2019-09-14 NOTE — ED Notes (Signed)
Pt placed on 2L [place] for comfort.

## 2019-09-14 NOTE — ED Notes (Addendum)
ED TO INPATIENT HANDOFF REPORT  ED Nurse Name and Phone #: Thurmond Butts [place] Name/Age/Gender [name] [name] 79 y.o. male Room/Bed: 037C/037C  Code Status   Code Status: DNR  Home/SNF/Other Home  Is this baseline? No  Triage Complete: Triage complete  Chief Complaint Diarrhea [R19.7] Multiple myeloma not having achieved remission ([place]) [C90.00]  Triage Note PT BIB [facility] from home. EMS reported multiple rough days per family. Hx multiple myeloma w mets. Abd tender, N/V/D, liquid stools on arrival. Oncologist aware of pt's arrival at [facility]. Emergency MD in room, to put in orders.     Allergies Allergies  Allergen Reactions  . Xarelto [Rivaroxaban] Other (See Comments) and Hypertension    INCREASED BP-HYPERTENSIVE EVENTS  . Corticosteroids Other (See Comments)    Made the patient  "sick," feel "weird," and his "body rejected" them   . Ramipril Other (See Comments)    Could not eat or sleep, lost muscle mass  . Benazepril Cough    Level of Care/Admitting Diagnosis ED Disposition    ED Disposition Condition [place] [facility] Area: [place] [[id number]]  Level of Care: Progressive [102]  Admit to Progressive based on following criteria: MULTISYSTEM THREATS such as stable sepsis, metabolic/electrolyte imbalance with or without encephalopathy that is responding to early treatment.  Covid Evaluation: Confirmed COVID Negative  Diagnosis: Multiple myeloma not having achieved remission [facility]) [[id number]]  Admitting Physician: Karmen Bongo [[id number]]  Attending Physician: Karmen Bongo [[id number]]  Estimated length of stay: 3 - 4 days  Certification:: I certify this patient will need inpatient services for at least 2 midnights       B Medical/Surgery History Past Medical History:  Diagnosis Date  . Ascending aortic aneurysm ([place])   . Bicuspid aortic valve   . Cancer ([place])   . CHF NYHA class I (no symptoms from ordinary activities), acute, diastolic ([place])    . Fatty liver    mild  . GI bleeding 07/21/2018   post polypectomy  . Hemorrhoids   . HTN (hypertension)   . Hypercholesteremia   . Hypokalemia   . Internal hemorrhoids   . LBP (low back pain)   . Moderate aortic stenosis   . Osteoarthritis   . Paroxysmal atrial fibrillation ([place])    a. new onset Afib in 07/2008. He underwent ibutilide cardioversion successfully. b. Recurrence 01/2013 s/p TEE/DCCV - was on Xarelto but he stopped it as he was convinced it was causing joint pn. c. Recurrence 01/2016 - spont conv to NSR. Pt took Eliquis x1 mo then declined further anticoag. d. Recurrence 07/2016.  [name] Pneumonia   . Tubular adenoma of colon    Past Surgical History:  Procedure Laterality Date  . BACK SURGERY  x12 years ago  . CARDIOVERSION N/A 01/26/2013   Procedure: CARDIOVERSION;  Surgeon: Larey Dresser, MD;  Location: [facility] ENDOSCOPY;  Service: Cardiovascular;  Laterality: N/A;  . CARDIOVERSION N/A 10/28/2017   Procedure: CARDIOVERSION;  Surgeon: Larey Dresser, MD;  Location: [facility] ENDOSCOPY;  Service: Cardiovascular;  Laterality: N/A;  . CARDIOVERSION N/A 03/03/2018   Procedure: CARDIOVERSION;  Surgeon: Lelon Perla, MD;  Location: [facility] ENDOSCOPY;  Service: Cardiovascular;  Laterality: N/A;  . COLONOSCOPY    . COLONOSCOPY  07/17/2018   at [facility]  . HEMORRHOID SURGERY    . LUMBAR LAMINECTOMY    . POLYPECTOMY    . RIGHT HEART CATH N/A 08/20/2019   Procedure: RIGHT HEART CATH;  Surgeon: Larey Dresser, MD;  Location:  [place] INVASIVE CV LAB;  Service: Cardiovascular;  Laterality: N/A;  . RIGHT/LEFT HEART CATH AND CORONARY ANGIOGRAPHY N/A 03/07/2019   Procedure: RIGHT/LEFT HEART CATH AND CORONARY ANGIOGRAPHY;  Surgeon: Burnell Blanks, MD;  Location: [place] CV LAB;  Service: Cardiovascular;  Laterality: N/A;  . TEE WITHOUT CARDIOVERSION N/A 01/26/2013   Procedure: TRANSESOPHAGEAL ECHOCARDIOGRAM (TEE);  Surgeon: Larey Dresser, MD;  Location: [place];  Service: Cardiovascular;   Laterality: N/A;  . TEE WITHOUT CARDIOVERSION N/A 10/28/2017   Procedure: TRANSESOPHAGEAL ECHOCARDIOGRAM (TEE);  Surgeon: Larey Dresser, MD;  Location: [facility] ENDOSCOPY;  Service: Cardiovascular;  Laterality: N/A;  . TEE WITHOUT CARDIOVERSION N/A 05/08/2019   Procedure: TRANSESOPHAGEAL ECHOCARDIOGRAM (TEE);  Surgeon: Burnell Blanks, MD;  Location: [place] CV LAB;  Service: Open Heart Surgery;  Laterality: N/A;  . TRANSCATHETER AORTIC VALVE REPLACEMENT, TRANSFEMORAL N/A 05/08/2019   Procedure: TRANSCATHETER AORTIC VALVE REPLACEMENT, TRANSFEMORAL;  Surgeon: Burnell Blanks, MD;  Location: [place] CV LAB;  Service: Open Heart Surgery;  Laterality: N/A;     A IV Location/Drains/Wounds Patient Lines/Drains/Airways Status   Active Line/Drains/Airways    Name:   Placement date:   Placement time:   Site:   Days:   Peripheral IV 08/20/19 Left Forearm   08/20/19    0615    Forearm   25   External Urinary Catheter   09/14/19    0836    -   less than 1          Intake/Output Last 24 hours  Intake/Output Summary (Last 24 hours) at 09/14/2019 1427 Last data filed at 09/14/2019 0931 Gross per 24 hour  Intake 600 ml  Output -  Net 600 ml    Labs/Imaging Results for orders placed or performed during the hospital encounter of 09/13/19 (from the past 48 hour(s))  CBC with Differential     Status: Abnormal   Collection Time: 09/13/19 11:25 PM  Result Value Ref Range   WBC 6.1 4.0 - 10.5 K/uL   RBC 5.56 4.22 - 5.81 MIL/uL   Hemoglobin 16.9 13.0 - 17.0 g/dL   HCT 50.8 39.0 - 52.0 %   MCV 91.4 80.0 - 100.0 fL   MCH 30.4 26.0 - 34.0 pg   MCHC 33.3 30.0 - 36.0 g/dL   RDW 12.6 11.5 - 15.5 %   Platelets 173 150 - 400 K/uL   nRBC 0.0 0.0 - 0.2 %   Neutrophils Relative % 83 %   Neutro Abs 5.1 1.7 - 7.7 K/uL   Lymphocytes Relative 14 %   Lymphs Abs 0.8 0.7 - 4.0 K/uL   Monocytes Relative 1 %   Monocytes Absolute 0.0 (L) 0.1 - 1.0 K/uL   Eosinophils Relative 1 %   Eosinophils  Absolute 0.0 0.0 - 0.5 K/uL   Basophils Relative 0 %   Basophils Absolute 0.0 0.0 - 0.1 K/uL   Immature Granulocytes 1 %   Abs Immature Granulocytes 0.04 0.00 - 0.07 K/uL    Comment: Performed at [facility], [address] [place], [place] [address]  Comprehensive metabolic panel     Status: Abnormal   Collection Time: 09/13/19 11:25 PM  Result Value Ref Range   Sodium 140 135 - 145 mmol/L   Potassium 3.7 3.5 - 5.1 mmol/L   Chloride 102 98 - 111 mmol/L   CO2 19 (L) 22 - 32 mmol/L   Glucose, Bld 192 (H) 70 - 99 mg/dL   BUN 44 (H) 8 - 23  mg/dL   Creatinine, Ser 2.02 (H) 0.61 - 1.24 mg/dL   Calcium 10.5 (H) 8.9 - 10.3 mg/dL   Total Protein 7.4 6.5 - 8.1 g/dL   Albumin 5.2 (H) 3.5 - 5.0 g/dL   AST 39 15 - 41 U/L   ALT 29 0 - 44 U/L   Alkaline Phosphatase 126 38 - 126 U/L   Total Bilirubin 1.6 (H) 0.3 - 1.2 mg/dL   GFR calc non Af Amer 30 (L) >60 mL/min   GFR calc Af Amer 35 (L) >60 mL/min   Anion gap 19 (H) 5 - 15    Comment: Performed at [place] [address], [place], [place] [address]  Lactic acid, plasma     Status: Abnormal   Collection Time: 09/13/19 11:25 PM  Result Value Ref Range   Lactic Acid, Venous 6.1 (HH) 0.5 - 1.9 mmol/L    Comment: CRITICAL RESULT CALLED TO, READ BACK BY AND VERIFIED WITH: RUGGIERO M,RN 09/14/19 0059 WAYK Performed at [facility], [place] [address] [place], [place] [address]   C difficile quick scan w PCR reflex     Status: None   Collection Time: 09/14/19  1:00 AM   Specimen: Stool  Result Value Ref Range   C Diff antigen NEGATIVE NEGATIVE   C Diff toxin NEGATIVE NEGATIVE   C Diff interpretation No C. difficile detected.     Comment: Performed at [facility], [place] [address], [place],  [address]  POC occult blood, ED     Status: Abnormal   Collection Time: 09/14/19  1:07 AM  Result Value Ref Range   Fecal Occult Bld POSITIVE (A) NEGATIVE  Respiratory Panel by RT PCR (Flu A&B, Covid) - Nasopharyngeal  Swab     Status: None   Collection Time: 09/14/19  1:16 AM   Specimen: Nasopharyngeal Swab  Result Value Ref Range   SARS Coronavirus 2 by RT PCR NEGATIVE NEGATIVE    Comment: (NOTE) SARS-CoV-2 target nucleic acids are NOT DETECTED. The SARS-CoV-2 RNA is generally detectable in upper respiratoy specimens during the acute phase of infection. The lowest concentration of SARS-CoV-2 viral copies this assay can detect is 131 copies/mL. A negative result does not preclude SARS-Cov-2 infection and should not be used as the sole basis for treatment or other patient management decisions. A negative result may occur with  improper specimen collection/handling, submission of specimen other than nasopharyngeal swab, presence of viral mutation(s) within the areas targeted by this assay, and inadequate number of viral copies (<131 copies/mL). A negative result must be combined with clinical observations, patient history, and epidemiological information. The expected result is Negative. Fact Sheet for Patients:  PinkCheek.be Fact Sheet for Healthcare Providers:  GravelBags.it This test is not yet ap proved or cleared by the [place] FDA and  has been authorized for detection and/or diagnosis of SARS-CoV-2 by FDA under an Emergency Use Authorization (EUA). This EUA will remain  in effect (meaning this test can be used) for the duration of the COVID-19 declaration under Section 564(b)(1) of the Act, 21 U.S.C. section 360bbb-3(b)(1), unless the authorization is terminated or revoked sooner.    Influenza A by PCR NEGATIVE NEGATIVE   Influenza B by PCR NEGATIVE NEGATIVE    Comment: (NOTE) The Xpert Xpress SARS-CoV-2/FLU/RSV assay is intended as an aid in  the diagnosis of influenza from Nasopharyngeal swab specimens and  should not be used as a sole basis for treatment. Nasal washings and  aspirates are unacceptable for Xpert Xpress  SARS-CoV-2/FLU/RSV  testing. Fact Sheet for Patients: PinkCheek.be Fact Sheet for Healthcare Providers: GravelBags.it This test is not yet approved or cleared by the [place] FDA and  has been authorized for detection and/or diagnosis of SARS-CoV-2 by  FDA under an Emergency Use Authorization (EUA). This EUA will remain  in effect (meaning this test can be used) for the duration of the  Covid-19 declaration under Section 564(b)(1) of the Act, 21  U.S.C. section 360bbb-3(b)(1), unless the authorization is  terminated or revoked. Performed at [facility], [place] [address], [place], [place] [address]   POC SARS Coronavirus 2 Ag-ED - Nasal Swab (BD Veritor Kit)     Status: None   Collection Time: 09/14/19  2:02 AM  Result Value Ref Range   SARS Coronavirus 2 Ag NEGATIVE NEGATIVE    Comment: (NOTE) SARS-CoV-2 antigen NOT DETECTED.  Negative results are presumptive.  Negative results do not preclude SARS-CoV-2 infection and should not be used as the sole basis for treatment or other patient management decisions, including infection  control decisions, particularly in the presence of clinical signs and  symptoms consistent with COVID-19, or in those who have been in contact with the virus.  Negative results must be combined with clinical observations, patient history, and epidemiological information. The expected result is Negative. Fact Sheet for Patients: PodPark.tn Fact Sheet for Healthcare Providers: GiftContent.is This test is not yet approved or cleared by the [place] FDA and  has been authorized for detection and/or diagnosis of SARS-CoV-2 by FDA under an Emergency Use Authorization (EUA).  This EUA will remain in effect (meaning this test can be used) for the duration of  the COVID-19 de claration under Section 564(b)(1) of the Act, 21 U.S.C.  section 360bbb-3(b)(1), unless the authorization is terminated or revoked sooner.   Lactic acid, plasma     Status: Abnormal   Collection Time: 09/14/19  6:09 AM  Result Value Ref Range   Lactic Acid, Venous 3.5 (HH) 0.5 - 1.9 mmol/L    Comment: CRITICAL VALUE NOTED.  VALUE IS CONSISTENT WITH PREVIOUSLY REPORTED AND CALLED VALUE. Performed at [facility], [place] [address], [place], [place] [address]   Lactic acid, plasma     Status: None   Collection Time: 09/14/19 10:27 AM  Result Value Ref Range   Lactic Acid, Venous 1.7 0.5 - 1.9 mmol/L    Comment: Performed at [place] [address], [place],  [address]  Procalcitonin     Status: None   Collection Time: 09/14/19 10:27 AM  Result Value Ref Range   Procalcitonin 48.04 ng/mL    Comment:        Interpretation: PCT >= 10 ng/mL: Important systemic inflammatory response, almost exclusively due to severe bacterial sepsis or septic shock. (NOTE)       Sepsis PCT Algorithm           Lower Respiratory Tract                                      Infection PCT Algorithm    ----------------------------     ----------------------------         PCT < 0.25 ng/mL                PCT < 0.10 ng/mL         Strongly encourage  Strongly discourage   discontinuation of antibiotics    initiation of antibiotics    ----------------------------     -----------------------------       PCT 0.25 - 0.50 ng/mL            PCT 0.10 - 0.25 ng/mL               OR       >80% decrease in PCT            Discourage initiation of                                            antibiotics      Encourage discontinuation           of antibiotics    ----------------------------     -----------------------------         PCT >= 0.50 ng/mL              PCT 0.26 - 0.50 ng/mL                AND       <80% decrease in PCT             Encourage initiation of                                             antibiotics       Encourage  continuation           of antibiotics    ----------------------------     -----------------------------        PCT >= 0.50 ng/mL                  PCT > 0.50 ng/mL               AND         increase in PCT                  Strongly encourage                                      initiation of antibiotics    Strongly encourage escalation           of antibiotics                                     -----------------------------                                           PCT <= 0.25 ng/mL                                                 OR                                        >  80% decrease in PCT                                     Discontinue / Do not initiate                                             antibiotics Performed at [facility], [place] [address] [place], [place] [address]   Protime-INR     Status: Abnormal   Collection Time: 09/14/19 10:27 AM  Result Value Ref Range   Prothrombin Time 17.6 (H) 11.4 - 15.2 seconds   INR 1.5 (H) 0.8 - 1.2    Comment: (NOTE) INR goal varies based on device and disease states. Performed at [facility], [place] [address] [place], [place] [address]   CBC     Status: Abnormal   Collection Time: 09/14/19 11:56 AM  Result Value Ref Range   WBC 15.8 (H) 4.0 - 10.5 K/uL   RBC 5.15 4.22 - 5.81 MIL/uL   Hemoglobin 15.5 13.0 - 17.0 g/dL   HCT 47.4 39.0 - 52.0 %   MCV 92.0 80.0 - 100.0 fL   MCH 30.1 26.0 - 34.0 pg   MCHC 32.7 30.0 - 36.0 g/dL   RDW 12.7 11.5 - 15.5 %   Platelets 112 (L) 150 - 400 K/uL    Comment: REPEATED TO VERIFY PLATELET COUNT CONFIRMED BY SMEAR Immature Platelet Fraction may be clinically indicated, consider ordering this additional test [id number]    nRBC 0.0 0.0 - 0.2 %    Comment: Performed at [facility], [place] [address], [place], [place] [address]  Urinalysis, Routine w reflex microscopic     Status: Abnormal   Collection Time: 09/14/19  1:00 PM  Result Value Ref Range   Color, Urine AMBER (A)  YELLOW    Comment: BIOCHEMICALS MAY BE AFFECTED BY COLOR   APPearance HAZY (A) CLEAR   Specific Gravity, Urine 1.019 1.005 - 1.030   pH 5.0 5.0 - 8.0   Glucose, UA NEGATIVE NEGATIVE mg/dL   Hgb urine dipstick NEGATIVE NEGATIVE   Bilirubin Urine NEGATIVE NEGATIVE   Ketones, ur NEGATIVE NEGATIVE mg/dL   Protein, ur NEGATIVE NEGATIVE mg/dL   Nitrite NEGATIVE NEGATIVE   Leukocytes,Ua NEGATIVE NEGATIVE    Comment: Performed at [facility] [place] [address], [place], [place] [address]   CT ABDOMEN PELVIS WO CONTRAST  Result Date: 09/14/2019 CLINICAL DATA:  Abdominal pain. Nausea, vomiting, diarrhea and liquid stool. History of multiple myeloma. EXAM: CT ABDOMEN AND PELVIS WITHOUT CONTRAST TECHNIQUE: Multidetector CT imaging of the abdomen and pelvis was performed following the standard protocol without IV contrast. COMPARISON:  CT angiography 03/14/2019. PET CT 04/06/2019 FINDINGS: Lower chest: Dilated distal esophagus containing enteric contrast. Minimal scarring in the right middle lobe and lingula. Minimal dependent atelectasis in the left lower lobe. No pleural fluid. Hepatobiliary: No focal liver lesion on noncontrast exam. Mild gallbladder distention without calcified gallstone or biliary dilatation. No pericholecystic inflammation. Pancreas: Parenchymal atrophy. No ductal dilatation or inflammation. Spleen: Normal in size without focal abnormality. Adrenals/Urinary Tract: Normal adrenal glands. No hydronephrosis or perinephric edema. Bilateral renal cysts. Ureters are decompressed. Urinary bladder is partially distended. Bladder displaced anteriorly by a stool ball distending the rectum. Stomach/Bowel: Enteric contrast in the  distal esophagus which is dilated. Distended stomach with air and oral contrast. There is no gastric wall thickening. No evidence of gastric outlet obstruction or mass effect. No small bowel obstruction or dilatation, administered enteric contrast reaches the cecum.  Appendix not well visualized, no evidence of appendicitis. Small volume of formed stool in the ascending colon, liquid stool in the transverse and descending colon. Stool within tortuous sigmoid colon. Sigmoid colonic wall thickening, series 3, image 62. Large stool ball distends the rectum with rectal distention of 8 cm. Mild perirectal fat stranding. No evidence of pneumatosis or perforation. Vascular/Lymphatic: Aorta bi-iliac atherosclerosis. Decreased size of previous retroperitoneal lymph node anterior to the IVC since prior PET, no longer visualized. No enlarged lymph nodes in the abdomen or pelvis. Reproductive: Prostate is unremarkable. Other: No ascites. No free air. Fat in the left inguinal canal. Musculoskeletal: Multiple bone lesions involving the pelvis, largest lesion with lytic in the right acetabulum. Overall no significant change in size from prior exam. Additional small bone lesions involving ribs. Stable endplate concavity at D32 and T12. No new osseous lesions. IMPRESSION: 1. Large stool ball distending the rectum with rectal distention and mild perirectal fat stranding. Findings consistent with fecal impaction. There is also wall thickening of the adjacent sigmoid colon with mild pericolonic edema. Possibility of stercoral colitis is raised. 2. Findings suggesting gastroparesis with gastric distension with air and contrast, as well as distended distal esophagus that is contrast filled. No evidence of gastric outlet obstruction or mechanical obstructing lesion. No bowel obstruction, administered enteric contrast reaches the cecum. 3. Decreased size of previous retroperitoneal lymph node since prior PET, no longer visualized. 4. Multiple pelvic bone lesions consistent with history of multiple myeloma, grossly unchanged in size from prior exam. Aortic Atherosclerosis (ICD10-I70.0). Electronically Signed   By: Keith Rake M.D.   On: 09/14/2019 04:05   CT HEAD WO CONTRAST  Result Date:  09/14/2019 CLINICAL DATA:  Presyncope EXAM: CT HEAD WITHOUT CONTRAST TECHNIQUE: Contiguous axial images were obtained from the base of the skull through the vertex without intravenous contrast. COMPARISON:  MRI 02/17/2017 FINDINGS: Brain: No evidence of acute infarction, hemorrhage, hydrocephalus, extra-axial collection or mass lesion/mass effect. Scattered low-density changes within the periventricular and subcortical white matter compatible with chronic microvascular ischemic change. Mild diffuse cerebral volume loss. Vascular: Mild atherosclerotic calcifications involving the large vessels of the skull base. No unexpected hyperdense vessel. Skull: Normal. Negative for fracture or focal lesion. Sinuses/Orbits: No acute finding. Other: None. IMPRESSION: 1.  No acute intracranial findings. 2.  Chronic microvascular ischemic change and cerebral volume loss. Electronically Signed   By: Davina Poke D.O.   On: 09/14/2019 09:59   DG Chest Port 1 View  Result Date: 09/14/2019 CLINICAL DATA:  Weakness. EXAM: PORTABLE CHEST 1 VIEW COMPARISON:  Radiograph 05/09/2019 FINDINGS: Normal heart size and mediastinal contours. TAVR. No pulmonary edema, focal airspace disease, pleural effusion or pneumothorax. No acute osseous abnormalities are seen. IMPRESSION: No acute chest findings. Electronically Signed   By: Keith Rake M.D.   On: 09/14/2019 02:23    Pending Labs Unresulted Labs (From admission, onward)    Start     Ordered   09/15/19 2025  Basic metabolic panel  Tomorrow morning,   R     09/14/19 0854   09/14/19 1018  CBC  Now then every 6 hours,   R     09/14/19 1037   09/14/19 0851  Lactic acid, plasma  STAT Now then every 3 hours,  STAT     09/14/19 0854   09/13/19 2325  GI pathogen panel by PCR, stool  (Gastrointestinal Panel by PCR, Stool                                                                                                                                                     *Does Not  include CLOSTRIDIUM DIFFICILE testing.**If CDIFF testing is needed, select the C Difficile Quick Screen w PCR reflex order below)  Once,   STAT     09/13/19 2325          Vitals/Pain Today's Vitals   09/14/19 1030 09/14/19 1037 09/14/19 1115 09/14/19 1130  BP: 101/73  117/76 (!) 114/94  Pulse: (!) 109  (!) 106 99  Resp: (!) 26  (!) 22 (!) 26  Temp:      TempSrc:      SpO2: 99%  91% 96%  Weight:      Height:      PainSc:  0-No pain      Isolation Precautions No active isolations  Medications Medications  fentaNYL (DURAGESIC) 12 MCG/HR 1 patch (1 patch Transdermal Not Given 09/14/19 1026)  fentaNYL (SUBLIMAZE) injection 25 mcg (has no administration in time range)  acyclovir (ZOVIRAX) tablet 400 mg (400 mg Oral Not Given 09/14/19 0932)  metoprolol tartrate (LOPRESSOR) injection 5 mg (5 mg Intravenous Not Given 09/14/19 0931)  LORazepam (ATIVAN) injection 1 mg (has no administration in time range)  sodium chloride flush (NS) 0.9 % injection 3 mL (3 mLs Intravenous Not Given 09/14/19 0930)  lactated ringers infusion ( Intravenous New Bag/Given 09/14/19 1024)  acetaminophen (TYLENOL) tablet 650 mg (has no administration in time range)    Or  acetaminophen (TYLENOL) suppository 650 mg (has no administration in time range)  ondansetron (ZOFRAN) tablet 4 mg (has no administration in time range)    Or  ondansetron (ZOFRAN) injection 4 mg (has no administration in time range)  ceFEPIme (MAXIPIME) 2 g in sodium chloride 0.9 % 100 mL IVPB (has no administration in time range)  vancomycin (VANCOREADY) IVPB 1500 mg/300 mL (has no administration in time range)  sodium chloride 0.9 % bolus 1,000 mL (0 mLs Intravenous Stopped 09/14/19 0110)  ondansetron (ZOFRAN) injection 4 mg (4 mg Intravenous Given 09/14/19 0009)  sodium chloride 0.9 % bolus 500 mL (0 mLs Intravenous Stopped 09/14/19 0931)  vancomycin (VANCOREADY) IVPB 1500 mg/300 mL (1,500 mg Intravenous New Bag/Given 09/14/19 1023)  ceFEPIme (MAXIPIME)  2 g in sodium chloride 0.9 % 100 mL IVPB (0 g Intravenous Stopped 09/14/19 0930)    Mobility non-ambulatory High fall risk   Focused Assessments    R Recommendations: See Admitting Provider Note  Report given to: [place] RN  Additional Notes:

## 2019-09-14 NOTE — Progress Notes (Signed)
Pharmacy Antibiotic Note  [name] [name] is a 79 y.o. male admitted on 09/13/2019 with abdominal pain and diarrhea.  Pharmacy has been consulted for vancomycin and cefepime dosing for sepsis.  SCr 2.02, CrCL 30 ml/min, afebrile, WBC WNL, LA down to 3.5.  Plan: Vanc 1500mg  IV Q48H for AUC 527 using SCr 2.02 Cefepime 2gm IV Q12H Monitor renal fxn, clinical progress, vanc AUC as indicated  Height: 5\' 11"  (180.3 cm) Weight: 158 lb (71.7 kg) IBW/kg (Calculated) : 75.3  Temp (24hrs), Avg:98.8 F (37.1 C), Min:98.8 F (37.1 C), Max:98.8 F (37.1 C)  Recent Labs  Lab 09/13/19 2325 09/14/19 0609  WBC 6.1  --   CREATININE 2.02*  --   LATICACIDVEN 6.1* 3.5*    Estimated Creatinine Clearance: 30.1 mL/min (A) (by C-G formula based on SCr of 2.02 mg/dL (H)).    Allergies  Allergen Reactions  . Xarelto [Rivaroxaban] Other (See Comments) and Hypertension    INCREASED BP-HYPERTENSIVE EVENTS  . Corticosteroids Other (See Comments)    Made the patient  "sick," feel "weird," and his "body rejected" them   . Ramipril Other (See Comments)    Could not eat or sleep, lost muscle mass  . Benazepril Cough    Vanc 1/1 >> Cefepime 1/1 >>  1/1 covid - negative 1/1 C.diff PCR - 1/1 GI panel PCR -   [name] [name] D. Mina Marble, PharmD, BCPS, [place] 09/14/2019, 8:54 AM

## 2019-09-14 NOTE — ED Notes (Addendum)
Pt's wife called for an update, this RN advised that pt was resting comfortably and MD would return call to go over test/imaging results

## 2019-09-14 NOTE — ED Notes (Signed)
Pt consumed first bottle of contrast, on the phone w wife

## 2019-09-14 NOTE — Assessment & Plan Note (Signed)
She is on Eliquis

## 2019-09-14 NOTE — Progress Notes (Signed)
Provider aware and spoke with rapid response nurse. MEWs has been elevated prior to coming from ED. Will continue to monitor pt.

## 2019-09-14 NOTE — ED Provider Notes (Addendum)
[place] EMERGENCY DEPARTMENT Provider Note   CSN: [id number] Arrival date & time: 09/13/19  2313     History Chief Complaint  Patient presents with  . Abdominal Pain    [name] [name] is a 79 y.o. male.  Patient presents to the emergency department for evaluation of abdominal pain and diarrhea.  Patient has been experiencing symptoms for several days.  He is experiencing diffuse abdominal pain and cramping with nausea, vomiting and frequent watery diarrhea.  No known fever.        Past Medical History:  Diagnosis Date  . Acute blood loss anemia   . Adenomatous colon polyp   . Ascending aortic aneurysm ([place])   . Bicuspid aortic valve   . Cancer ([place])   . Chest pain    ETT-myoview 12/11 w/exercise, no chest pain, no significant ST changes, EF 69%, no evidence for ischemia or infarction.  . CHF NYHA class I (no symptoms from ordinary activities), acute, diastolic ([place])   . Fatty liver    mild  . GI bleed   . GI bleeding 07/21/2018   post polypectomy  . Hemorrhoids   . HTN (hypertension)   . Hypercholesteremia   . Hypokalemia   . Internal hemorrhoids   . LBP (low back pain)   . Moderate aortic stenosis   . Osteoarthritis   . Paroxysmal atrial fibrillation ([place])    a. new onset Afib in 07/2008. He underwent ibutilide cardioversion successfully. b. Recurrence 01/2013 s/p TEE/DCCV - was on Xarelto but he stopped it as he was convinced it was causing joint pn. c. Recurrence 01/2016 - spont conv to NSR. Pt took Eliquis x1 mo then declined further anticoag. d. Recurrence 07/2016.  [name] Pneumonia   . Tubular adenoma of colon     Patient Active Problem List   Diagnosis Date Noted  . Diarrhea 09/14/2019  . Peripheral neuropathy 09/13/2019  . Gait disorder 09/13/2019  . Multiple myeloma not having achieved remission ([place]) 05/17/2019  . Counseling regarding advance care planning and goals of care 05/17/2019  . Bone metastases ([place]) 05/17/2019  .  Postoperative fever 05/09/2019  . S/P TAVR (transcatheter aortic valve replacement) 05/08/2019  . Multiple myeloma ([place]) 05/08/2019  . Acute on chronic diastolic heart failure ([place]) 05/08/2019  . Severe aortic stenosis   . Polymyalgia rheumatica syndrome ([place]) 01/29/2019  . CAD (coronary artery disease) 08/01/2018  . HTN (hypertension) 02/28/2018  . Hypercholesteremia 02/28/2018  . Mastoiditis 03/21/2017  . Abnormal TSH 12/23/2016  . Parapneumonic effusion   . Ascending aortic aneurysm ([place]) 02/15/2013  . Well adult exam 05/31/2012  . PAF (paroxysmal atrial fibrillation) ([place]) 08/05/2008  . HLD (hyperlipidemia) 04/08/2007  . Essential hypertension 04/08/2007    Past Surgical History:  Procedure Laterality Date  . BACK SURGERY  x12 years ago  . CARDIOVERSION N/A 01/26/2013   Procedure: CARDIOVERSION;  Surgeon: Larey Dresser, MD;  Location: [facility] ENDOSCOPY;  Service: Cardiovascular;  Laterality: N/A;  . CARDIOVERSION N/A 10/28/2017   Procedure: CARDIOVERSION;  Surgeon: Larey Dresser, MD;  Location: [facility] ENDOSCOPY;  Service: Cardiovascular;  Laterality: N/A;  . CARDIOVERSION N/A 03/03/2018   Procedure: CARDIOVERSION;  Surgeon: Lelon Perla, MD;  Location: [facility] ENDOSCOPY;  Service: Cardiovascular;  Laterality: N/A;  . COLONOSCOPY    . COLONOSCOPY  07/17/2018   at [facility]  . HEMORRHOID SURGERY    . LUMBAR LAMINECTOMY    . POLYPECTOMY    . RIGHT HEART CATH N/A 08/20/2019   Procedure: RIGHT HEART  CATH;  Surgeon: Larey Dresser, MD;  Location: [place] CV LAB;  Service: Cardiovascular;  Laterality: N/A;  . RIGHT/LEFT HEART CATH AND CORONARY ANGIOGRAPHY N/A 03/07/2019   Procedure: RIGHT/LEFT HEART CATH AND CORONARY ANGIOGRAPHY;  Surgeon: Burnell Blanks, MD;  Location: [place] CV LAB;  Service: Cardiovascular;  Laterality: N/A;  . TEE WITHOUT CARDIOVERSION N/A 01/26/2013   Procedure: TRANSESOPHAGEAL ECHOCARDIOGRAM (TEE);  Surgeon: Larey Dresser, MD;  Location: [place];   Service: Cardiovascular;  Laterality: N/A;  . TEE WITHOUT CARDIOVERSION N/A 10/28/2017   Procedure: TRANSESOPHAGEAL ECHOCARDIOGRAM (TEE);  Surgeon: Larey Dresser, MD;  Location[facility] END[facility]COPY;  Service: Cardiovascular;  Laterality: N/A;  . TEE WITHOUT CARDIOVERSION N/A 05/08/2019   Procedure: TRANSESOPHAGEAL ECHOCARDIOGRAM (TEE);  Surgeon: Burnell Blanks, MD;  Location: [place] CV LAB;  Service: Open Heart Surgery;  Laterality: N/A;  . TRANSCATHETER AORTIC VALVE REPLACEMENT, TRANSFEMORAL N/A 05/08/2019   Procedure: TRANSCATHETER AORTIC VALVE REPLACEMENT, TRANSFEMORAL;  Surgeon: Burnell Blanks, MD;  Location: [place] CV LAB;  Service: Open Heart Surgery;  Laterality: N/A;       Family History  Problem Relation Age of Onset  . Colon cancer Mother 38  . Hypertension Other   . Coronary artery disease Neg Hx   . Colon polyps Neg Hx   . Esophageal cancer Neg Hx   . Rectal cancer Neg Hx   . Stomach cancer Neg Hx     Social History   Tobacco Use  . Smoking status: Never Smoker  . Smokeless tobacco: Never Used  Substance Use Topics  . Alcohol use: Yes    Comment: Drinks 1 glass of wine nightly/socially  . Drug use: No    Home Medications Prior to Admission medications   Medication Sig Start Date End Date Taking? Authorizing Provider  acyclovir (ZOVIRAX) 400 MG tablet Take 1 tablet (400 mg total) by mouth 2 (two) times daily. 08/15/19   Brunetta Genera, MD  amLODipine (NORVASC) 5 MG tablet Take 1 tablet (5 mg total) by mouth daily. 05/16/19 05/10/20  Eileen Stanford, PA-C  amoxicillin (AMOXIL) 500 MG capsule amoxicillin 500 mg capsule    [provider]  Carboxymethylcellul-Glycerin (LUBRICATING EYE DROPS OP) Place 1 drop into both eyes daily as needed (dry eyes).    [provider]  Cholecalciferol (VITAMIN D) 50 MCG (2000 UT) tablet Take 2,000 Units by mouth daily.    [provider]  dexamethasone (DECADRON) 4 MG tablet Take 3  tablets (12 mg total) by mouth once a week. Patient taking differently: Take 20 mg by mouth every Monday.  06/12/19   Brunetta Genera, MD  diclofenac Sodium (VOLTAREN) 1 % GEL Apply 1 application topically 4 (four) times daily as needed (pain).    [provider]  DULoxetine (CYMBALTA) 60 MG capsule Take 1 capsule (60 mg total) by mouth daily. 08/15/19   Brunetta Genera, MD  ELIQUIS 5 MG TABS tablet TAKE 1 TABLET BY MOUTH TWICE A DAY Patient taking differently: Take 5 mg by mouth 2 (two) times daily.  07/09/19   Larey Dresser, MD  ergocalciferol (VITAMIN D2) 1.25 MG (50000 UT) capsule Take 1 capsule (50,000 Units total) by mouth once a week. 07/03/19   Brunetta Genera, MD  famotidine (PEPCID) 40 MG tablet Take 1 tablet (40 mg total) by mouth daily. 01/29/19   [name], [name], MD  fentaNYL (DURAGESIC) 12 MCG/HR Place 1 patch onto the skin every 3 (three) days. 08/15/19  Brunetta Genera, MD  furosemide (LASIX) 20 MG tablet Take '40mg'$  (2 tabs) daily alternating with '20mg'$  (1 tab) daily every other day 08/28/19   Larey Dresser, MD  gabapentin (NEURONTIN) 300 MG capsule Take 1 in the morning and 2 tablet at bedtime. 09/10/19   Patel, Arvin Collard K, DO  lenalidomide (REVLIMID) 15 MG capsule Take 1 capsule (15 mg total) by mouth daily. Take for 14 days on, 7 days off, repeat every 21 days. Patient taking differently: Take 15 mg by mouth See admin instructions. Take 15 mg daily for 14 days on, 7 days off, repeat every 21 days. 07/05/19   Brunetta Genera, MD  loratadine (CLARITIN) 10 MG tablet Take 1 tablet (10 mg total) by mouth daily. 12/11/18   [name], [name], MD  LORazepam (ATIVAN) 0.5 MG tablet Take 1 tablet (0.5 mg total) by mouth every 6 (six) hours as needed (Nausea or vomiting). 05/17/19   Brunetta Genera, MD  metoprolol succinate (TOPROL-XL) 25 MG 24 hr tablet Take 1 tablet (25 mg total) by mouth 2 (two) times daily. 06/22/19   Larey Dresser, MD    ondansetron (ZOFRAN) 8 MG tablet Take 1 tablet (8 mg total) by mouth 2 (two) times daily as needed (Nausea or vomiting). 05/17/19   Brunetta Genera, MD  oxyCODONE (OXY IR/ROXICODONE) 5 MG immediate release tablet Take 1-2 tablets (5-10 mg total) by mouth every 4 (four) hours as needed for moderate pain or severe pain. 08/15/19   Brunetta Genera, MD  potassium chloride SA (KLOR-CON M20) 20 MEQ tablet Take 2 tablets (40 mEq total) by mouth 2 (two) times daily. 08/15/19   Brunetta Genera, MD  prochlorperazine (COMPAZINE) 10 MG tablet Take 1 tablet (10 mg total) by mouth every 6 (six) hours as needed (Nausea or vomiting). 05/17/19   Brunetta Genera, MD  senna-docusate (SENNA S) 8.6-50 MG tablet Take 2 tablets by mouth at bedtime. Patient taking differently: Take 2 tablets by mouth at bedtime as needed for mild constipation.  06/19/19   Brunetta Genera, MD  spironolactone (ALDACTONE) 50 MG tablet Take 1 tablet (50 mg total) by mouth daily. 08/16/19 11/14/19  Larey Dresser, MD    Allergies    Xarelto [rivaroxaban], Corticosteroids, Ramipril, and Benazepril  Review of Systems   Review of Systems  Gastrointestinal: Positive for abdominal pain, diarrhea, nausea and vomiting.  All other systems reviewed and are negative.   Physical Exam Updated Vital Signs BP (!) 125/96   Pulse (!) 113   Temp 98.8 F (37.1 C) (Rectal)   Resp (!) 27   Ht '5\' 11"'$  (1.803 m)   Wt 71.7 kg   SpO2 97%   BMI 22.04 kg/m   Physical Exam Vitals and nursing note reviewed.  Constitutional:      General: He is in acute distress.     Appearance: Normal appearance. He is ill-appearing and diaphoretic.  HENT:     Head: Normocephalic and atraumatic.     Right Ear: Hearing normal.     Left Ear: Hearing normal.     Nose: Nose normal.  Eyes:     Conjunctiva/sclera: Conjunctivae normal.     Pupils: Pupils are equal, round, and reactive to light.  Cardiovascular:     Rate and Rhythm: Regular rhythm.  Tachycardia present.     Heart sounds: S1 normal and S2 normal. No murmur. No friction rub. No gallop.   Pulmonary:     Effort: Pulmonary effort is  normal. No respiratory distress.     Breath sounds: Normal breath sounds.  Chest:     Chest wall: No tenderness.  Abdominal:     General: Bowel sounds are normal.     Palpations: Abdomen is soft.     Tenderness: There is generalized abdominal tenderness. There is no guarding or rebound. Negative signs include Murphy's sign and McBurney's sign.     Hernia: No hernia is present.  Musculoskeletal:        General: Normal range of motion.     Cervical back: Normal range of motion and neck supple.  Skin:    General: Skin is warm.     Findings: No rash.  Neurological:     Mental Status: He is alert and oriented to person, place, and time.     GCS: GCS eye subscore is 4. GCS verbal subscore is 5. GCS motor subscore is 6.     Cranial Nerves: No cranial nerve deficit.     Sensory: No sensory deficit.     Coordination: Coordination normal.  Psychiatric:        Speech: Speech normal.        Behavior: Behavior normal.        Thought Content: Thought content normal.     ED Results / Procedures / Treatments   Labs (all labs ordered are listed, but only abnormal results are displayed) Labs Reviewed  CBC WITH DIFFERENTIAL/PLATELET - Abnormal; Notable for the following components:      Result Value   Monocytes Absolute 0.0 (*)    All other components within normal limits  COMPREHENSIVE METABOLIC PANEL - Abnormal; Notable for the following components:   CO2 19 (*)    Glucose, Bld 192 (*)    BUN 44 (*)    Creatinine, Ser 2.02 (*)    Calcium 10.5 (*)    Albumin 5.2 (*)    Total Bilirubin 1.6 (*)    GFR calc non Af Amer 30 (*)    GFR calc Af Amer 35 (*)    Anion gap 19 (*)    All other components within normal limits  LACTIC ACID, PLASMA - Abnormal; Notable for the following components:   Lactic Acid, Venous 6.1 (*)    All other components  within normal limits  POC OCCULT BLOOD, ED - Abnormal; Notable for the following components:   Fecal Occult Bld POSITIVE (*)    All other components within normal limits  RESPIRATORY PANEL BY RT PCR (FLU A&B, COVID)  GI PATHOGEN PANEL BY PCR, STOOL  C DIFFICILE QUICK SCREEN W PCR REFLEX  URINALYSIS, ROUTINE W REFLEX MICROSCOPIC  LACTIC ACID, PLASMA  POC SARS CORONAVIRUS 2 AG -  ED    EKG None  Radiology CT ABDOMEN PELVIS WO CONTRAST  Result Date: 09/14/2019 CLINICAL DATA:  Abdominal pain. Nausea, vomiting, diarrhea and liquid stool. History of multiple myeloma. EXAM: CT ABDOMEN AND PELVIS WITHOUT CONTRAST TECHNIQUE: Multidetector CT imaging of the abdomen and pelvis was performed following the standard protocol without IV contrast. COMPARISON:  CT angiography 03/14/2019. PET CT 04/06/2019 FINDINGS: Lower chest: Dilated distal esophagus containing enteric contrast. Minimal scarring in the right middle lobe and lingula. Minimal dependent atelectasis in the left lower lobe. No pleural fluid. Hepatobiliary: No focal liver lesion on noncontrast exam. Mild gallbladder distention without calcified gallstone or biliary dilatation. No pericholecystic inflammation. Pancreas: Parenchymal atrophy. No ductal dilatation or inflammation. Spleen: Normal in size without focal abnormality. Adrenals/Urinary Tract: Normal adrenal glands. No hydronephrosis or perinephric  edema. Bilateral renal cysts. Ureters are decompressed. Urinary bladder is partially distended. Bladder displaced anteriorly by a stool ball distending the rectum. Stomach/Bowel: Enteric contrast in the distal esophagus which is dilated. Distended stomach with air and oral contrast. There is no gastric wall thickening. No evidence of gastric outlet obstruction or mass effect. No small bowel obstruction or dilatation, administered enteric contrast reaches the cecum. Appendix not well visualized, no evidence of appendicitis. Small volume of formed stool  in the ascending colon, liquid stool in the transverse and descending colon. Stool within tortuous sigmoid colon. Sigmoid colonic wall thickening, series 3, image 62. Large stool ball distends the rectum with rectal distention of 8 cm. Mild perirectal fat stranding. No evidence of pneumatosis or perforation. Vascular/Lymphatic: Aorta bi-iliac atherosclerosis. Decreased size of previous retroperitoneal lymph node anterior to the IVC since prior PET, no longer visualized. No enlarged lymph nodes in the abdomen or pelvis. Reproductive: Prostate is unremarkable. Other: No ascites. No free air. Fat in the left inguinal canal. Musculoskeletal: Multiple bone lesions involving the pelvis, largest lesion with lytic in the right acetabulum. Overall no significant change in size from prior exam. Additional small bone lesions involving ribs. Stable endplate concavity at A76 and T12. No new osseous lesions. IMPRESSION: 1. Large stool ball distending the rectum with rectal distention and mild perirectal fat stranding. Findings consistent with fecal impaction. There is also wall thickening of the adjacent sigmoid colon with mild pericolonic edema. Possibility of stercoral colitis is raised. 2. Findings suggesting gastroparesis with gastric distension with air and contrast, as well as distended distal esophagus that is contrast filled. No evidence of gastric outlet obstruction or mechanical obstructing lesion. No bowel obstruction, administered enteric contrast reaches the cecum. 3. Decreased size of previous retroperitoneal lymph node since prior PET, no longer visualized. 4. Multiple pelvic bone lesions consistent with history of multiple myeloma, grossly unchanged in size from prior exam. Aortic Atherosclerosis (ICD10-I70.0). Electronically Signed   By: Keith Rake M.D.   On: 09/14/2019 04:05   DG Chest Port 1 View  Result Date: 09/14/2019 CLINICAL DATA:  Weakness. EXAM: PORTABLE CHEST 1 VIEW COMPARISON:  Radiograph  05/09/2019 FINDINGS: Normal heart size and mediastinal contours. TAVR. No pulmonary edema, focal airspace disease, pleural effusion or pneumothorax. No acute osseous abnormalities are seen. IMPRESSION: No acute chest findings. Electronically Signed   By: Keith Rake M.D.   On: 09/14/2019 02:23    Procedures Procedures (including critical care time)  Medications Ordered in ED Medications  sodium chloride 0.9 % bolus 500 mL (has no administration in time range)    Followed by  0.9 %  sodium chloride infusion (has no administration in time range)  sodium chloride 0.9 % bolus 1,000 mL (0 mLs Intravenous Stopped 09/14/19 0110)  ondansetron (ZOFRAN) injection 4 mg (4 mg Intravenous Given 09/14/19 0009)    ED Course  I have reviewed the triage vital signs and the nursing notes.  Pertinent labs & imaging results that were available during my care of the patient were reviewed by me and considered in my medical decision making (see chart for details).    MDM Rules/Calculators/A&P                      Patient presents to the emergency department for evaluation of abdominal pain with diarrhea.  Patient has had diffuse abdominal pain with profuse liquid stool/diarrhea over the last 1 or 2 days.  At arrival he is ill-appearing, tachycardic, tachypneic and diaphoretic.  He has continuous stool seepage.  Abdominal exam was nonfocal.  No leukocytosis.  Patient does have a mild acute kidney injury.  He was given IV fluids at arrival.  He is found to have significant lactic acidosis, unclear etiology.  No obvious source of infection.  CT abdomen and pelvis shows large rectal stool ball.  I did manually disimpact a large amount of claylike stool from this area.  Liquid stool present at arrival was likely overflow incontinence.  C diff and gastro panel pending.  Patient continues to be tachycardic and tachypneic despite IV fluids.  Chest x-ray does not show any evidence of infection or other abnormality.   Covid and flu PCR are negative.  He is requiring supplemental oxygen, 2 L by nasal cannula to keep his oxygen saturations above 90%.  Patient noted to have hypercalcemia.  This is likely secondary to his known multiple myeloma with bone involvement.  This could also explain worsening renal function, although dehydration is suspected.   Lactic acidosis felt to be multifactorial including dehydration and his diarrheal illness.  No obvious source for infection or sepsis, but he is persistently tachycardic and tachypneic.  I was initially holding off on antibiotics while awaiting C. difficile results.  As there has been a delay in achieving these results, will empirically treat with antibiotics.  Repeat lactic acid pending.   Patient will require hospitalization for further management.  Final Clinical Impression(s) / ED Diagnoses Final diagnoses:  Generalized abdominal pain  Hypercalcemia  Lactic acidosis    Rx / DC Orders ED Discharge Orders    None       Orpah Greek, MD 09/14/19 5615    Orpah Greek, MD 09/14/19 [phone]

## 2019-09-15 LAB — CBC
HCT: 42.7 % (ref 39.0–52.0)
HCT: 39.1 % (ref 39.0–52.0)
Hemoglobin: 14 g/dL (ref 13.0–17.0)
Hemoglobin: 13.1 g/dL (ref 13.0–17.0)
MCH: 30.4 pg (ref 26.0–34.0)
MCH: 30.3 pg (ref 26.0–34.0)
MCHC: 32.8 g/dL (ref 30.0–36.0)
MCHC: 33.5 g/dL (ref 30.0–36.0)
MCV: 92.6 fL (ref 80.0–100.0)
MCV: 90.3 fL (ref 80.0–100.0)
Platelets: 82 10*3/uL — ABNORMAL LOW (ref 150–400)
Platelets: 87 10*3/uL — ABNORMAL LOW (ref 150–400)
RBC: 4.61 MIL/uL (ref 4.22–5.81)
RBC: 4.33 MIL/uL (ref 4.22–5.81)
RDW: 12.9 % (ref 11.5–15.5)
RDW: 12.8 % (ref 11.5–15.5)
WBC: 10 10*3/uL (ref 4.0–10.5)
WBC: 10.2 10*3/uL (ref 4.0–10.5)
nRBC: 0 % (ref 0.0–0.2)
nRBC: 0 % (ref 0.0–0.2)

## 2019-09-15 LAB — GLUCOSE, CAPILLARY
Glucose-Capillary: 113 mg/dL — ABNORMAL HIGH (ref 70–99)
Glucose-Capillary: 93 mg/dL (ref 70–99)
Glucose-Capillary: 93 mg/dL (ref 70–99)

## 2019-09-15 MED ORDER — GABAPENTIN 100 MG PO CAPS: 100.0000 mg | ORAL_CAPSULE | ORAL | Status: DC

## 2019-09-15 MED ORDER — GABAPENTIN 100 MG PO CAPS
200.0000 mg | ORAL_CAPSULE | Freq: Every day | ORAL | Status: DC
  Administered 2019-09-15 – 2019-09-20 (×6): 200 mg via ORAL
  Filled 2019-09-15 (×7): qty 2

## 2019-09-15 MED ORDER — GABAPENTIN 100 MG PO CAPS
100.0000 mg | ORAL_CAPSULE | Freq: Every morning | ORAL | Status: DC
  Administered 2019-09-16 – 2019-09-21 (×5): 100 mg via ORAL
  Filled 2019-09-15 (×6): qty 1

## 2019-09-15 MED ORDER — METOPROLOL SUCCINATE ER 25 MG PO TB24
25.0000 mg | ORAL_TABLET | Freq: Two times a day (BID) | ORAL | Status: DC
  Administered 2019-09-15: 25 mg via ORAL
  Filled 2019-09-15: qty 1

## 2019-09-15 MED ORDER — OXYCODONE HCL 5 MG PO TABS
5.0000 mg | ORAL_TABLET | ORAL | Status: DC | PRN
  Administered 2019-09-15 – 2019-09-18 (×7): 5 mg via ORAL
  Administered 2019-09-18: 10 mg via ORAL
  Administered 2019-09-18 (×2): 5 mg via ORAL
  Administered 2019-09-19: 10 mg via ORAL
  Administered 2019-09-19: 5 mg via ORAL
  Administered 2019-09-19 – 2019-09-20 (×2): 10 mg via ORAL
  Administered 2019-09-20: 5 mg via ORAL
  Administered 2019-09-21: 10 mg via ORAL
  Filled 2019-09-15: qty 1
  Filled 2019-09-15 (×3): qty 2
  Filled 2019-09-15 (×2): qty 1
  Filled 2019-09-15: qty 2
  Filled 2019-09-15 (×5): qty 1
  Filled 2019-09-15: qty 2
  Filled 2019-09-15 (×3): qty 1

## 2019-09-15 NOTE — Progress Notes (Signed)
PROGRESS NOTE    [name] [name]  HFW:[id number] DOB: 1940/11/21 DOA: 09/13/2019 PCP: Cassandria Anger, MD   Brief Narrative: [name] [name] is a 79 y.o. male with medical history significant of afib on Eliquis; HTN; HLD; chronic diastolic CHF; s/p TAVR; and multiple myeloma with metastatic disease. Patient presented secondary to abdominal pain with concern for sepsis on admission. Started on empiric antibiotics.   Assessment & Plan:   Principal Problem:   Abdominal pain, diffuse Active Problems:   Essential hypertension   PAF (paroxysmal atrial fibrillation) (HCC)   Acute kidney injury superimposed on CKD (HCC)   S/P TAVR (transcatheter aortic valve replacement)   Multiple myeloma not having achieved remission (HCC)   Bone metastases (HCC)   Peripheral neuropathy   Bloody diarrhea   Acute metabolic encephalopathy   Goals of care, counseling/discussion   DNR (do not resuscitate)   Abdominal pain with bloody diarrhea Unknown etiology. Likely infectious. C. Difficile negative. No abdominal pain. Symptoms improving with soft stool this morning -Continue IV fluids -Follow GI pathogen panel  Sepsis Criteria met on admission. Concern for GI pathogen. Started on empiric vancomycin and cefepime. No blood cultures obtained. Afebrile. WBC initially of 15.8 and now trended down. Procalcitonin of 48 in setting of patient's cancer. -Will continue Vancomycin/Cefepime for now and likely transition to Cefepime vs oral equivalent in 24 hours  Altered mental status Possibly secondary to significant dehydration. Patient appears to be at baseline  AKI Baseline creatinine appears to be less than one. Does not appear to have CKD. Creatinine of 2.02 on admission and is improving with IV fluids -BMP in AM -Continue IV fluids  Multiple myeloma Plasma cell myeloma Patient follows with Dr. Irene Limbo. Has associated history of hypercalcemia, currently on Zometa. Oncology curbsided on admission with  recommendations for supportive care. On Acyclovir and weekly Decadron.  Peripheral neuropathy Appears to be secondary to chemotherapy -Continue home Neurontin (watch for renal function), Cymbalta and oxycodone  Paroxysmal atrial fibrillation Patient is on Eliquis as an outpatient which was held secondary to bloody stools. Also on metoprolol for rate control. Episode of SVT. -Restart home metoprolol -Hold Eliquis until bleeding improves  Chronic diastolic heart failure Hypovolemic. No exacerbation. -Watch fluid status carefully  Dysuria Urinalysis does not suggest infection. No known history of BPH. -Urine culture  Pressure injury Sacrum, POA   DVT prophylaxis: SCDs Code Status:   Code Status: DNR Family Communication: Wife at bedside Disposition Plan: Discharge pending continued hydration   Consultants:   Oncology (curbside)  Procedures:   None  Antimicrobials:  Vancomycin  Cefepime    Subjective: No concerns today other than being thirsty  Objective: Vitals:   09/14/19 1613 09/14/19 2005 09/15/19 0518 09/15/19 0523  BP:  108/72 (!) 158/135   Pulse:  (!) 109 (!) 101   Resp:  19 18   Temp: 99 F (37.2 C) 97.8 F (36.6 C) 98.7 F (37.1 C)   TempSrc: Axillary Oral Oral   SpO2:  98% 98%   Weight:    75.7 kg  Height:        Intake/Output Summary (Last 24 hours) at 09/15/2019 0836 Last data filed at 09/15/2019 0600 Gross per 24 hour  Intake 2695.83 ml  Output 400 ml  Net 2295.83 ml   Filed Weights   09/13/19 2322 09/14/19 1533 09/15/19 0523  Weight: 71.7 kg 72.4 kg 75.7 kg    Examination:  General exam: Appears calm and comfortable Respiratory system: Clear to auscultation. Respiratory effort normal. Cardiovascular system:  S1 & S2 heard, Slightly tachycardic with normal rhythm. No murmurs, rubs, gallops or clicks. Gastrointestinal system: Abdomen is nondistended, soft and nontender. No organomegaly or masses felt. Normal bowel sounds heard.  Central nervous system: Alert and oriented. No focal neurological deficits. Extremities: No edema. No calf tenderness Skin: No cyanosis. No rashes Psychiatry: Judgement and insight appear normal. Mood & affect appropriate.     Data Reviewed: I have personally reviewed following labs and imaging studies  CBC: Recent Labs  Lab 09/13/19 2325 09/14/19 1156 09/14/19 1615 09/14/19 2135 09/15/19 0420  WBC 6.1 15.8* 15.4* 12.2* 10.0  NEUTROABS 5.1  --   --   --   --   HGB 16.9 15.5 14.9 14.5 14.0  HCT 50.8 47.4 44.2 42.7 42.7  MCV 91.4 92.0 89.7 89.5 92.6  PLT 173 112* 117* 93* 82*   Basic Metabolic Panel: Recent Labs  Lab 09/13/19 2325 09/15/19 0420  NA 140 139  K 3.7 4.8  CL 102 109  CO2 19* 18*  GLUCOSE 192* 110*  BUN 44* 41*  CREATININE 2.02* 1.53*  CALCIUM 10.5* 8.4*   GFR: Estimated Creatinine Clearance: 35.3 mL/min (A) (by C-G formula based on SCr of 1.53 mg/dL (H)). Liver Function Tests: Recent Labs  Lab 09/13/19 2325  AST 39  ALT 29  ALKPHOS 126  BILITOT 1.6*  PROT 7.4  ALBUMIN 5.2*   No results for input(s): LIPASE, AMYLASE in the last 168 hours. No results for input(s): AMMONIA in the last 168 hours. Coagulation Profile: Recent Labs  Lab 09/14/19 1027  INR 1.5*   Cardiac Enzymes: No results for input(s): CKTOTAL, CKMB, CKMBINDEX, TROPONINI in the last 168 hours. BNP (last 3 results) No results for input(s): PROBNP in the last 8760 hours. HbA1C: No results for input(s): HGBA1C in the last 72 hours. CBG: Recent Labs  Lab 09/14/19 2150 09/15/19 0705  GLUCAP 115* 113*   Lipid Profile: No results for input(s): CHOL, HDL, LDLCALC, TRIG, CHOLHDL, LDLDIRECT in the last 72 hours. Thyroid Function Tests: No results for input(s): TSH, T4TOTAL, FREET4, T3FREE, THYROIDAB in the last 72 hours. Anemia Panel: No results for input(s): VITAMINB12, FOLATE, FERRITIN, TIBC, IRON, RETICCTPCT in the last 72 hours. Sepsis Labs: Recent Labs  Lab 09/13/19  2325 09/14/19 0609 09/14/19 1027 09/14/19 1615  PROCALCITON  --   --  48.04  --   LATICACIDVEN 6.1* 3.5* 1.7 1.5    Recent Results (from the past 240 hour(s))  C difficile quick scan w PCR reflex     Status: None   Collection Time: 09/14/19  1:00 AM   Specimen: Stool  Result Value Ref Range Status   C Diff antigen NEGATIVE NEGATIVE Final   C Diff toxin NEGATIVE NEGATIVE Final   C Diff interpretation No C. difficile detected.  Final    Comment: Performed at [facility], [place] [address], [place], [place] [address]  Respiratory Panel by RT PCR (Flu A&B, Covid) - Nasopharyngeal Swab     Status: None   Collection Time: 09/14/19  1:16 AM   Specimen: Nasopharyngeal Swab  Result Value Ref Range Status   SARS Coronavirus 2 by RT PCR NEGATIVE NEGATIVE Final    Comment: (NOTE) SARS-CoV-2 target nucleic acids are NOT DETECTED. The SARS-CoV-2 RNA is generally detectable in upper respiratoy specimens during the acute phase of infection. The lowest concentration of SARS-CoV-2 viral copies this assay can detect is 131 copies/mL. A negative result does not preclude SARS-Cov-2 infection and should not be used  as the sole basis for treatment or other patient management decisions. A negative result may occur with  improper specimen collection/handling, submission of specimen other than nasopharyngeal swab, presence of viral mutation(s) within the areas targeted by this assay, and inadequate number of viral copies (<131 copies/mL). A negative result must be combined with clinical observations, patient history, and epidemiological information. The expected result is Negative. Fact Sheet for Patients:  PinkCheek.be Fact Sheet for Healthcare Providers:  GravelBags.it This test is not yet ap proved or cleared by the [place] FDA and  has been authorized for detection and/or diagnosis of SARS-CoV-2 by FDA under an Emergency Use  Authorization (EUA). This EUA will remain  in effect (meaning this test can be used) for the duration of the COVID-19 declaration under Section 564(b)(1) of the Act, 21 U.S.C. section 360bbb-3(b)(1), unless the authorization is terminated or revoked sooner.    Influenza A by PCR NEGATIVE NEGATIVE Final   Influenza B by PCR NEGATIVE NEGATIVE Final    Comment: (NOTE) The Xpert Xpress SARS-CoV-2/FLU/RSV assay is intended as an aid in  the diagnosis of influenza from Nasopharyngeal swab specimens and  should not be used as a sole basis for treatment. Nasal washings and  aspirates are unacceptable for Xpert Xpress SARS-CoV-2/FLU/RSV  testing. Fact Sheet for Patients: PinkCheek.be Fact Sheet for Healthcare Providers: GravelBags.it This test is not yet approved or cleared by the [place] FDA and  has been authorized for detection and/or diagnosis of SARS-CoV-2 by  FDA under an Emergency Use Authorization (EUA). This EUA will remain  in effect (meaning this test can be used) for the duration of the  Covid-19 declaration under Section 564(b)(1) of the Act, 21  U.S.C. section 360bbb-3(b)(1), unless the authorization is  terminated or revoked. Performed at [facility], [place] [address], [place], [place] [address]          Radiology Studies: CT ABDOMEN PELVIS WO CONTRAST  Result Date: 09/14/2019 CLINICAL DATA:  Abdominal pain. Nausea, vomiting, diarrhea and liquid stool. History of multiple myeloma. EXAM: CT ABDOMEN AND PELVIS WITHOUT CONTRAST TECHNIQUE: Multidetector CT imaging of the abdomen and pelvis was performed following the standard protocol without IV contrast. COMPARISON:  CT angiography 03/14/2019. PET CT 04/06/2019 FINDINGS: Lower chest: Dilated distal esophagus containing enteric contrast. Minimal scarring in the right middle lobe and lingula. Minimal dependent atelectasis in the left lower lobe. No pleural fluid.  Hepatobiliary: No focal liver lesion on noncontrast exam. Mild gallbladder distention without calcified gallstone or biliary dilatation. No pericholecystic inflammation. Pancreas: Parenchymal atrophy. No ductal dilatation or inflammation. Spleen: Normal in size without focal abnormality. Adrenals/Urinary Tract: Normal adrenal glands. No hydronephrosis or perinephric edema. Bilateral renal cysts. Ureters are decompressed. Urinary bladder is partially distended. Bladder displaced anteriorly by a stool ball distending the rectum. Stomach/Bowel: Enteric contrast in the distal esophagus which is dilated. Distended stomach with air and oral contrast. There is no gastric wall thickening. No evidence of gastric outlet obstruction or mass effect. No small bowel obstruction or dilatation, administered enteric contrast reaches the cecum. Appendix not well visualized, no evidence of appendicitis. Small volume of formed stool in the ascending colon, liquid stool in the transverse and descending colon. Stool within tortuous sigmoid colon. Sigmoid colonic wall thickening, series 3, image 62. Large stool ball distends the rectum with rectal distention of 8 cm. Mild perirectal fat stranding. No evidence of pneumatosis or perforation. Vascular/Lymphatic: Aorta bi-iliac atherosclerosis. Decreased size of previous retroperitoneal lymph node anterior to the IVC  since prior PET, no longer visualized. No enlarged lymph nodes in the abdomen or pelvis. Reproductive: Prostate is unremarkable. Other: No ascites. No free air. Fat in the left inguinal canal. Musculoskeletal: Multiple bone lesions involving the pelvis, largest lesion with lytic in the right acetabulum. Overall no significant change in size from prior exam. Additional small bone lesions involving ribs. Stable endplate concavity at D80 and T12. No new osseous lesions. IMPRESSION: 1. Large stool ball distending the rectum with rectal distention and mild perirectal fat stranding.  Findings consistent with fecal impaction. There is also wall thickening of the adjacent sigmoid colon with mild pericolonic edema. Possibility of stercoral colitis is raised. 2. Findings suggesting gastroparesis with gastric distension with air and contrast, as well as distended distal esophagus that is contrast filled. No evidence of gastric outlet obstruction or mechanical obstructing lesion. No bowel obstruction, administered enteric contrast reaches the cecum. 3. Decreased size of previous retroperitoneal lymph node since prior PET, no longer visualized. 4. Multiple pelvic bone lesions consistent with history of multiple myeloma, grossly unchanged in size from prior exam. Aortic Atherosclerosis (ICD10-I70.0). Electronically Signed   By: Keith Rake M.D.   On: 09/14/2019 04:05   CT HEAD WO CONTRAST  Result Date: 09/14/2019 CLINICAL DATA:  Presyncope EXAM: CT HEAD WITHOUT CONTRAST TECHNIQUE: Contiguous axial images were obtained from the base of the skull through the vertex without intravenous contrast. COMPARISON:  MRI 02/17/2017 FINDINGS: Brain: No evidence of acute infarction, hemorrhage, hydrocephalus, extra-axial collection or mass lesion/mass effect. Scattered low-density changes within the periventricular and subcortical white matter compatible with chronic microvascular ischemic change. Mild diffuse cerebral volume loss. Vascular: Mild atherosclerotic calcifications involving the large vessels of the skull base. No unexpected hyperdense vessel. Skull: Normal. Negative for fracture or focal lesion. Sinuses/Orbits: No acute finding. Other: None. IMPRESSION: 1.  No acute intracranial findings. 2.  Chronic microvascular ischemic change and cerebral volume loss. Electronically Signed   By: Davina Poke D.O.   On: 09/14/2019 09:59   DG Chest Port 1 View  Result Date: 09/14/2019 CLINICAL DATA:  Weakness. EXAM: PORTABLE CHEST 1 VIEW COMPARISON:  Radiograph 05/09/2019 FINDINGS: Normal heart size and  mediastinal contours. TAVR. No pulmonary edema, focal airspace disease, pleural effusion or pneumothorax. No acute osseous abnormalities are seen. IMPRESSION: No acute chest findings. Electronically Signed   By: Keith Rake M.D.   On: 09/14/2019 02:23        Scheduled Meds: . acyclovir  400 mg Oral BID  . fentaNYL  1 patch Transdermal Q72H  . metoprolol tartrate  5 mg Intravenous Q12H  . sodium chloride flush  3 mL Intravenous Q12H   Continuous Infusions: . ceFEPime (MAXIPIME) IV 2 g (09/15/19 0810)  . lactated ringers 100 mL/hr at 09/14/19 2246  . [START ON 09/16/2019] vancomycin       LOS: 1 day     Cordelia Poche, MD [facility] 09/15/2019, 8:36 AM  If 7PM-7AM, please contact night-coverage [email or web address]

## 2019-09-15 NOTE — Evaluation (Addendum)
Physical Therapy Evaluation Patient Details Name: [name] [name] MRN: [id number] DOB: 04-09-1941 Today's Date: 09/15/2019   History of Present Illness  [name] [name] is a 79 y.o. male with medical history significant of afib on Eliquis; HTN; HLD; chronic diastolic CHF; s/p TAVR; and multiple myeloma with metastatic disease. Patient presented secondary to abdominal pain with concern for sepsis on admission.  Clinical Impression  Patient presents with mobility limited due to pain, weakness and limited tolerance with in bed evaluation only this session per pt preference.  Reports he was ambulatory with a cane PTA and wife supervised on steps.  Currently min A for in bed mobility and declines EOB or OOB mobility.  Feel he will benefit from skilled PT in the acute setting to allow hopeful return home with family support.  Will further assess needs as pt tolerates.     Follow Up Recommendations Home health PT;Supervision/Assistance - 24 hour(To be fully assessed when pt allows OOB)    Equipment Recommendations  Rolling walker with 5" wheels    Recommendations for Other Services       Precautions / Restrictions Precautions Precautions: Fall      Mobility  Bed Mobility Overal bed mobility: Needs Assistance Bed Mobility: Rolling Rolling: Min guard         General bed mobility comments: rolled over to R then L to allow assistance for hygiene and to change bed pad; scooted up to [facility] with min A max cues, assist to reach railings and HOB flat.  Transfers                 General transfer comment: deferred due to pain/pt not ready for sitting up or OOB despite encouragement to get up for bed linen and hygiene activity  Ambulation/Gait                Stairs            Wheelchair Mobility    Modified Rankin (Stroke Patients Only)       Balance                                             Pertinent Vitals/Pain Pain Assessment: Faces Faces Pain  Scale: Hurts whole lot Pain Location: "everywhere" Pain Descriptors / Indicators: Aching;Nagging;Discomfort Pain Intervention(s): Repositioned;Monitored during session;Limited activity within patient's tolerance    Home Living Family/patient expects to be discharged to:: Private residence Living Arrangements: Spouse/significant other Available Help at Discharge: Family Type of Home: House       Home Layout: Two level Home Equipment: Kasandra Knudsen - single point      Prior Function Level of Independence: Independent with assistive device(s)         Comments: wife assisting some with supervision on steps, etc     Hand Dominance        Extremity/Trunk Assessment   Upper Extremity Assessment Upper Extremity Assessment: Generalized weakness    Lower Extremity Assessment Lower Extremity Assessment: Generalized weakness;RLE deficits/detail;LLE deficits/detail RLE Sensation: history of peripheral neuropathy LLE Sensation: history of peripheral neuropathy       Communication   Communication: Other (comment)(at times difficult to understand due to accent)  Cognition Arousal/Alertness: Awake/alert Behavior During Therapy: WFL for tasks assessed/performed Overall Cognitive Status: No family/caregiver present to determine baseline cognitive functioning  General Comments: seems mostly oriented recalling events prior to admission with vomiting episode and previous TAVR, but overly verbal about telling his history and stalling to avoid getting up today      General Comments General comments (skin integrity, edema, etc.): Patient relates his recent medical issues and feels he is much less capable due to medication related issues with neuropathy, fatigue, weakness, etc.  Seems eager for palliation of his symptoms.    Exercises     Assessment/Plan    PT Assessment Patient needs continued PT services  PT Problem List         PT  Treatment Interventions DME instruction;Therapeutic activities;Patient/family education;Gait training;Therapeutic exercise;Stair training;Functional mobility training    PT Goals (Current goals can be found in the Care Plan section)  Acute Rehab PT Goals Patient Stated Goal: to improve symptoms PT Goal Formulation: With patient Time For Goal Achievement: 09/29/19 Potential to Achieve Goals: Fair    Frequency Min 3X/week   Barriers to discharge        Co-evaluation               AM-PAC PT "6 Clicks" Mobility  Outcome Measure Help needed turning from your back to your side while in a flat bed without using bedrails?: A Little Help needed moving from lying on your back to sitting on the side of a flat bed without using bedrails?: A Little Help needed moving to and from a bed to a chair (including a wheelchair)?: Total Help needed standing up from a chair using your arms (e.g., wheelchair or bedside chair)?: Total Help needed to walk in hospital room?: Total Help needed climbing 3-5 steps with a railing? : Total 6 Click Score: 10    End of Session   Activity Tolerance: Patient limited by pain Patient left: in bed;with bed alarm set;with call bell/phone within reach   PT Visit Diagnosis: Muscle weakness (generalized) (M62.81);Adult, failure to thrive (R62.7)    Time: [id number] PT Time Calculation (min) (ACUTE ONLY): 23 min   Charges:   PT Evaluation $PT Eval Moderate Complexity: [place], [place] Acute Rehabilitation Services [phone] 09/15/2019   [name] 09/15/2019, 3:00 PM

## 2019-09-15 NOTE — Progress Notes (Signed)
Patient is alert and oriented upon assessment. Wife at bedside states it is a complete change from yesterday. Patient states he would like to eat and have some water. Patient states he has had a bowel movement. Bowel does not appear to have any blood. Will communicate with MD about advancing to a clear liquid if deemed appropriate.

## 2019-09-16 ENCOUNTER — Other Ambulatory Visit: Payer: Self-pay | Admitting: Hematology

## 2019-09-16 DIAGNOSIS — Z66 Do not resuscitate: Secondary | ICD-10-CM

## 2019-09-16 DIAGNOSIS — Z952 Presence of prosthetic heart valve: Secondary | ICD-10-CM

## 2019-09-16 DIAGNOSIS — C7951 Secondary malignant neoplasm of bone: Secondary | ICD-10-CM

## 2019-09-16 DIAGNOSIS — L8990 Pressure ulcer of unspecified site, unspecified stage: Secondary | ICD-10-CM | POA: Insufficient documentation

## 2019-09-16 DIAGNOSIS — G9341 Metabolic encephalopathy: Secondary | ICD-10-CM

## 2019-09-16 DIAGNOSIS — R197 Diarrhea, unspecified: Secondary | ICD-10-CM

## 2019-09-16 DIAGNOSIS — I10 Essential (primary) hypertension: Secondary | ICD-10-CM

## 2019-09-16 DIAGNOSIS — R1084 Generalized abdominal pain: Secondary | ICD-10-CM

## 2019-09-16 DIAGNOSIS — N189 Chronic kidney disease, unspecified: Secondary | ICD-10-CM

## 2019-09-16 DIAGNOSIS — N179 Acute kidney failure, unspecified: Secondary | ICD-10-CM

## 2019-09-16 DIAGNOSIS — I4891 Unspecified atrial fibrillation: Secondary | ICD-10-CM

## 2019-09-16 DIAGNOSIS — I480 Paroxysmal atrial fibrillation: Secondary | ICD-10-CM

## 2019-09-16 LAB — GLUCOSE, CAPILLARY
Glucose-Capillary: 77 mg/dL (ref 70–99)
Glucose-Capillary: 72 mg/dL (ref 70–99)
Glucose-Capillary: 88 mg/dL (ref 70–99)
Glucose-Capillary: 77 mg/dL (ref 70–99)

## 2019-09-16 LAB — COMPREHENSIVE METABOLIC PANEL
ALT: 25 U/L (ref 0–44)
AST: 30 U/L (ref 15–41)
Albumin: 2.7 g/dL — ABNORMAL LOW (ref 3.5–5.0)
Alkaline Phosphatase: 95 U/L (ref 38–126)
Anion gap: 9 (ref 5–15)
BUN: 19 mg/dL (ref 8–23)
CO2: 19 mmol/L — ABNORMAL LOW (ref 22–32)
Calcium: 7.8 mg/dL — ABNORMAL LOW (ref 8.9–10.3)
Chloride: 109 mmol/L (ref 98–111)
Creatinine, Ser: 0.91 mg/dL (ref 0.61–1.24)
GFR calc Af Amer: >60 mL/min (ref >60)
GFR calc non Af Amer: >60 mL/min (ref >60)
Glucose, Bld: 91 mg/dL (ref 70–99)
Potassium: 3.9 mmol/L (ref 3.5–5.1)
Sodium: 137 mmol/L (ref 135–145)
Total Bilirubin: 0.7 mg/dL (ref 0.3–1.2)
Total Protein: 4.3 g/dL — ABNORMAL LOW (ref 6.5–8.1)

## 2019-09-16 LAB — CBC
HCT: 35.3 % — ABNORMAL LOW (ref 39.0–52.0)
Hemoglobin: 11.7 g/dL — ABNORMAL LOW (ref 13.0–17.0)
MCH: 30.6 pg (ref 26.0–34.0)
MCHC: 33.1 g/dL (ref 30.0–36.0)
MCV: 92.4 fL (ref 80.0–100.0)
Platelets: 80 10*3/uL — ABNORMAL LOW (ref 150–400)
RBC: 3.82 MIL/uL — ABNORMAL LOW (ref 4.22–5.81)
RDW: 13.1 % (ref 11.5–15.5)
WBC: 7.5 10*3/uL (ref 4.0–10.5)
nRBC: 0 % (ref 0.0–0.2)

## 2019-09-16 LAB — URINE CULTURE: Culture: NO GROWTH

## 2019-09-16 LAB — BASIC METABOLIC PANEL
Anion gap: 12 (ref 5–15)
BUN: 41 mg/dL — ABNORMAL HIGH (ref 8–23)
CO2: 18 mmol/L — ABNORMAL LOW (ref 22–32)
Calcium: 8.4 mg/dL — ABNORMAL LOW (ref 8.9–10.3)
Chloride: 109 mmol/L (ref 98–111)
Creatinine, Ser: 1.53 mg/dL — ABNORMAL HIGH (ref 0.61–1.24)
GFR calc Af Amer: 49 mL/min — ABNORMAL LOW (ref >60)
GFR calc non Af Amer: 43 mL/min — ABNORMAL LOW (ref >60)
Glucose, Bld: 110 mg/dL — ABNORMAL HIGH (ref 70–99)
Potassium: 4.8 mmol/L (ref 3.5–5.1)
Sodium: 139 mmol/L (ref 135–145)

## 2019-09-16 MED ORDER — AMIODARONE HCL IN DEXTROSE 360-4.14 MG/200ML-% IV SOLN
60.0000 mg/h | INTRAVENOUS | Status: DC
  Administered 2019-09-16 – 2019-09-18 (×4): 30 mg/h via INTRAVENOUS
  Administered 2019-09-18 – 2019-09-20 (×8): 60 mg/h via INTRAVENOUS
  Filled 2019-09-16 (×11): qty 200

## 2019-09-16 MED ORDER — DICLOFENAC SODIUM 1 % EX GEL
2.0000 g | Freq: Four times a day (QID) | CUTANEOUS | Status: DC | PRN
  Administered 2019-09-16 – 2019-09-18 (×2): 2 g via TOPICAL
  Filled 2019-09-16: qty 100

## 2019-09-16 MED ORDER — AMIODARONE HCL IN DEXTROSE 360-4.14 MG/200ML-% IV SOLN
60.0000 mg/h | INTRAVENOUS | Status: AC
  Administered 2019-09-16: 60 mg/h via INTRAVENOUS
  Filled 2019-09-16: qty 200

## 2019-09-16 MED ORDER — APIXABAN 5 MG PO TABS
5.0000 mg | ORAL_TABLET | Freq: Two times a day (BID) | ORAL | Status: DC
  Filled 2019-09-16 (×2): qty 1

## 2019-09-16 MED ORDER — DEXAMETHASONE 6 MG PO TABS
12.0000 mg | ORAL_TABLET | ORAL | Status: DC
  Filled 2019-09-16: qty 2

## 2019-09-16 MED ORDER — SODIUM CHLORIDE 0.9 % IV BOLUS: 250.0000 mL | Freq: Once | INTRAVENOUS | Status: DC | PRN

## 2019-09-16 MED ORDER — AMIODARONE LOAD VIA INFUSION
150.0000 mg | Freq: Once | INTRAVENOUS | Status: AC
  Administered 2019-09-16: 150 mg via INTRAVENOUS
  Filled 2019-09-16 (×4): qty 83.34

## 2019-09-16 MED ORDER — DULOXETINE HCL 60 MG PO CPEP
60.0000 mg | ORAL_CAPSULE | Freq: Every day | ORAL | Status: DC
  Administered 2019-09-16 – 2019-09-21 (×5): 60 mg via ORAL
  Filled 2019-09-16 (×6): qty 1

## 2019-09-16 MED ORDER — SODIUM CHLORIDE 0.9 % IV BOLUS
1000.0000 mL | Freq: Once | INTRAVENOUS | Status: AC
  Administered 2019-09-16: 1000 mL via INTRAVENOUS

## 2019-09-16 NOTE — Significant Event (Signed)
Rapid Response Event Note  Overview: Hypotension and Tachycardia  Initial Focused Assessment: I came by to see the patient as follow up. SBP were in the 80-90s and his HR was in the 130-140s. MD had ordered for Amiodarone IV for HR control. Patient did endorse not feeling well overall. He was not in acute distress. I was called away to an emergency  Interventions: -- None--  Plan of Care: -- RN to start AMIO, should BP worsen, RN can start a small 250 cc NS bolus and contact the MD on call. -- Monitor VS  Nurse called me [phone] to report SBP in the 70s. I asked the BP be checked manually in both arms and that the MD be notified. I called about 10 minutes, per nurse, BP rechecked now in the low 100s. Patient was c/o of some burning pain in LE, gabapentin was give.   Event Summary:  Start Time 0845 End Time 0848  [name] [name] R

## 2019-09-16 NOTE — Progress Notes (Signed)
Patient is on an amiodaron drip and frequent vitals are already in place

## 2019-09-16 NOTE — Progress Notes (Signed)
B/p 83/61 mm hg, pt is stable, notify rapid team states will page provider. Will follow monitor

## 2019-09-16 NOTE — Progress Notes (Signed)
noitfy rapid team, start ekg, afib rvr, rapid team is in room.

## 2019-09-16 NOTE — Progress Notes (Signed)
Patient complaining of extreme pain in legs. Has history of chronic neuropathy. Gabapentin given.  BP is holding steady with systolic above 90 and still awaiting amiodarone bolus from main pharmacy. HR 130-160s

## 2019-09-16 NOTE — Progress Notes (Signed)
[id number], pt refused any pain , refused to get ativan, b/p slightly low see v/s, request to let him rest, notify provider, will follow up new order.

## 2019-09-16 NOTE — Progress Notes (Signed)
Discussed resuming eliquis per cardiology but patient has now had some blood tinged mucous stools. Will hold eliquis today and assess how stool looks overnight

## 2019-09-16 NOTE — Progress Notes (Signed)
OT Cancellation Note  Patient Details Name: [name] [name] MRN: [id number] DOB: 07-19-1941   Cancelled Treatment:    Reason Eval/Treat Not Completed: Medical issues which prohibited therapy. RN in room, Pt in significant pain, and HR finally under control (has been in the 150's), requested to hold until tomorrow. Wife present and Pt and family in agreement. Both are very pleasant. OT will hold evaluation and continue to follow for medical appropriateness.   Jesse Sans 09/16/2019, 6:37 PM   Jesse Sans OTR/L Acute Rehabilitation Services Pager: [phone] Office: [phone]

## 2019-09-16 NOTE — Progress Notes (Signed)
PROGRESS NOTE    [name] [name]  ZPH:[id number] DOB: May 15, 1941 DOA: 09/13/2019 PCP: Cassandria Anger, MD     Brief Narrative:  [name] [name] is a 79 y.o. male with medical history significant of Paroxysmal A fib on Eliquis; HTN; HLD; chronic diastolic CHF; s/p TAVR in Aug 2020; and multiple myeloma with metastatic disease followed by Dr. Irene Limbo presenting with abdominal pain. Wife reports that he was diagnosed with bone cancer in August.  They found it while undergoing work-up for TAVR.  He was diagnosed with multiple myeloma, was undergoing infusions, none in 2-3 weeks.  He has lost a lot of weight.  Last night, he was vomiting profusely, cold/sweaty, hard of breathing.  He started acutely last night, and developed diarrhea about a 30 minutes later.  No fevers.  He was admitted for severe sepsis, thought to be abdominal etiology as he presented with bloody diarrhea as well as altered mental status.  C. difficile was negative.  New events last 24 hours / Subjective: Overnight, patient went into A. fib RVR with relative hypotension.  This morning, patient has some complaints of reflux, diarrhea has improved.  Denies any abdominal pain.  His heart rate is ranging 140-160s, irregular in rhythm.  His blood pressure also has been relatively low, most recent SBP 94.  He denies any dizziness, CP, worsening SOB.   Assessment & Plan:   Principal Problem:   Abdominal pain, diffuse Active Problems:   Essential hypertension   PAF (paroxysmal atrial fibrillation) (HCC)   Acute kidney injury superimposed on CKD (HCC)   S/P TAVR (transcatheter aortic valve replacement)   Multiple myeloma not having achieved remission (HCC)   Bone metastases (HCC)   Peripheral neuropathy   Bloody diarrhea   Acute metabolic encephalopathy   Goals of care, counseling/discussion   DNR (do not resuscitate)   Pressure injury of skin   Severe sepsis secondary to abdominal etiology and bloody diarrhea as well as altered  mental status on admission, sepsis present on admission -C. difficile negative, GI PCR pending -Abdominal pain with diarrhea seems to be improving. WBC 7.5 and remains afebrile  -Started on empiric vancomycin and cefepime --> Stop vanco today   Acute metabolic encephalopathy -Patient was noted to be obtunded at the time of admission -Improved  A. fib RVR -History of paroxysmal atrial fibrillation, followed by Dr. Aundra Dubin.  Pt has required cardioversion in the past -Eliquis on hold due to bloody diarrhea on admission -Due to hypotension, stop metoprolol. Start amiodarone drip due to RVR with rates 140-160 as well as hypotension -Cardiology consulted  AKI -Baseline creatinine 0.87  -Cr peaked at 2.02 and now AKI resolved after IVF   Multiple myeloma, plasma cell myeloma -Patient follows with Dr. Irene Limbo. Has associated history of hypercalcemia, currently on Zometa. Oncology curbsided on admission with recommendations for supportive care. On Acyclovir and weekly Decadron, Revlimid   Peripheral neuropathy appears to be secondary to chemotherapy -Continue home Neurontin (watch for renal function), Cymbalta and oxycodone  Chronic diastolic heart failure -No acute exacerbation at this time -Home Lasix and spironolactone held due to AKI on admission  Bicuspid aortic valve disorder -Status post TAVR August 2020  Dysuria -Urinalysis does not suggest infection. No known history of BPH. Urine culture pending     In agreement with assessment of the pressure ulcer as below:  Pressure Injury 09/14/19 Sacrum Anterior Stage 2 -  Partial thickness loss of dermis presenting as a shallow open injury with a red, pink wound bed without  slough. (Active)  09/14/19 1621  Location: Sacrum  Location Orientation: Anterior  Staging: Stage 2 -  Partial thickness loss of dermis presenting as a shallow open injury with a red, pink wound bed without slough.  Wound Description (Comments):   Present on  Admission: Yes         DVT prophylaxis: SCD, Eliquis on hold due to bloody diarrhea on admission Code Status: DNR Family Communication: No family at bedside this morning Disposition Plan: Pending improvement in sepsis, A. fib RVR   Consultants:   Oncology (curbside)  Cardiology  Procedures:   None  Antimicrobials:  Anti-infectives (From admission, onward)   Start     Dose/Rate Route Frequency Ordered Stop   09/16/19 1000  vancomycin (VANCOREADY) IVPB 1500 mg/300 mL     1,500 mg 150 mL/hr over 120 Minutes Intravenous Every 48 hours 09/14/19 0857     09/14/19 2000  ceFEPIme (MAXIPIME) 2 g in sodium chloride 0.9 % 100 mL IVPB     2 g 200 mL/hr over 30 Minutes Intravenous Every 12 hours 09/14/19 0857     09/14/19 1000  acyclovir (ZOVIRAX) tablet 400 mg     400 mg Oral 2 times daily 09/14/19 0854     09/14/19 0630  vancomycin (VANCOREADY) IVPB 1500 mg/300 mL     1,500 mg 150 mL/hr over 120 Minutes Intravenous  Once 09/14/19 0624 09/14/19 1230   09/14/19 0630  ceFEPIme (MAXIPIME) 2 g in sodium chloride 0.9 % 100 mL IVPB     2 g 200 mL/hr over 30 Minutes Intravenous  Once 09/14/19 0624 09/14/19 0930        Objective: Vitals:   09/16/19 0609 09/16/19 0631 09/16/19 0714 09/16/19 0808  BP: (!) 92/55  113/70 94/64  Pulse: (!) 146 (!) 123 (!) 126 (!) 123  Resp:   20   Temp:      TempSrc:      SpO2:   98% 98%  Weight:      Height:        Intake/Output Summary (Last 24 hours) at 09/16/2019 1000 Last data filed at 09/16/2019 0731 Gross per 24 hour  Intake 2721.31 ml  Output 1400 ml  Net 1321.31 ml   Filed Weights   09/13/19 2322 09/14/19 1533 09/15/19 0523  Weight: 71.7 kg 72.4 kg 75.7 kg    Examination:  General exam: Appears calm and comfortable  Respiratory system: Clear to auscultation. Respiratory effort normal. No respiratory distress. No conversational dyspnea.  Cardiovascular system: S1 & S2 heard, Irreg rhythm, rate tachycardic. No murmurs. No pedal  edema. Gastrointestinal system: Abdomen is nondistended, soft and nontender Central nervous system: Alert and oriented. No focal neurological deficits. Speech clear.  Extremities: Symmetric in appearance  Skin: No rashes, lesions or ulcers on exposed skin  Psychiatry: Judgement and insight appear stable    Data Reviewed: I have personally reviewed following labs and imaging studies  CBC: Recent Labs  Lab 09/13/19 2325 09/14/19 1615 09/14/19 2135 09/15/19 0420 09/15/19 1109 09/16/19 0845  WBC 6.1 15.4* 12.2* 10.0 10.2 7.5  NEUTROABS 5.1  --   --   --   --   --   HGB 16.9 14.9 14.5 14.0 13.1 11.7*  HCT 50.8 44.2 42.7 42.7 39.1 35.3*  MCV 91.4 89.7 89.5 92.6 90.3 92.4  PLT 173 117* 93* 82* 87* 80*   Basic Metabolic Panel: Recent Labs  Lab 09/13/19 2325 09/15/19 0420  NA 140 139  K 3.7 4.8  CL 102 109  CO2 19* 18*  GLUCOSE 192* 110*  BUN 44* 41*  CREATININE 2.02* 1.53*  CALCIUM 10.5* 8.4*   GFR: Estimated Creatinine Clearance: 35.3 mL/min (A) (by C-G formula based on SCr of 1.53 mg/dL (H)). Liver Function Tests: Recent Labs  Lab 09/13/19 2325  AST 39  ALT 29  ALKPHOS 126  BILITOT 1.6*  PROT 7.4  ALBUMIN 5.2*   No results for input(s): LIPASE, AMYLASE in the last 168 hours. No results for input(s): AMMONIA in the last 168 hours. Coagulation Profile: Recent Labs  Lab 09/14/19 1027  INR 1.5*   Cardiac Enzymes: No results for input(s): CKTOTAL, CKMB, CKMBINDEX, TROPONINI in the last 168 hours. BNP (last 3 results) No results for input(s): PROBNP in the last 8760 hours. HbA1C: No results for input(s): HGBA1C in the last 72 hours. CBG: Recent Labs  Lab 09/15/19 0705 09/15/19 1210 09/15/19 1646 09/15/19 2121 09/16/19 0530  GLUCAP 113* 93 93 77 72   Lipid Profile: No results for input(s): CHOL, HDL, LDLCALC, TRIG, CHOLHDL, LDLDIRECT in the last 72 hours. Thyroid Function Tests: No results for input(s): TSH, T4TOTAL, FREET4, T3FREE, THYROIDAB in the  last 72 hours. Anemia Panel: No results for input(s): VITAMINB12, FOLATE, FERRITIN, TIBC, IRON, RETICCTPCT in the last 72 hours. Sepsis Labs: Recent Labs  Lab 09/13/19 2325 09/14/19 0609 09/14/19 1027 09/14/19 1615  PROCALCITON  --   --  48.04  --   LATICACIDVEN 6.1* 3.5* 1.7 1.5    Recent Results (from the past 240 hour(s))  C difficile quick scan w PCR reflex     Status: None   Collection Time: 09/14/19  1:00 AM   Specimen: Stool  Result Value Ref Range Status   C Diff antigen NEGATIVE NEGATIVE Final   C Diff toxin NEGATIVE NEGATIVE Final   C Diff interpretation No C. difficile detected.  Final    Comment: Performed at [facility], [place] [address] [place], [place] [address]  Respiratory Panel by RT PCR (Flu A&B, Covid) - Nasopharyngeal Swab     Status: None   Collection Time: 09/14/19  1:16 AM   Specimen: Nasopharyngeal Swab  Result Value Ref Range Status   SARS Coronavirus 2 by RT PCR NEGATIVE NEGATIVE Final    Comment: (NOTE) SARS-CoV-2 target nucleic acids are NOT DETECTED. The SARS-CoV-2 RNA is generally detectable in upper respiratoy specimens during the acute phase of infection. The lowest concentration of SARS-CoV-2 viral copies this assay can detect is 131 copies/mL. A negative result does not preclude SARS-Cov-2 infection and should not be used as the sole basis for treatment or other patient management decisions. A negative result may occur with  improper specimen collection/handling, submission of specimen other than nasopharyngeal swab, presence of viral mutation(s) within the areas targeted by this assay, and inadequate number of viral copies (<131 copies/mL). A negative result must be combined with clinical observations, patient history, and epidemiological information. The expected result is Negative. Fact Sheet for Patients:  PinkCheek.be Fact Sheet for Healthcare Providers:   GravelBags.it This test is not yet ap proved or cleared by the [place] FDA and  has been authorized for detection and/or diagnosis of SARS-CoV-2 by FDA under an Emergency Use Authorization (EUA). This EUA will remain  in effect (meaning this test can be used) for the duration of the COVID-19 declaration under Section 564(b)(1) of the Act, 21 U.S.C. section 360bbb-3(b)(1), unless the authorization is terminated or revoked sooner.    Influenza A by PCR NEGATIVE NEGATIVE Final  Influenza B by PCR NEGATIVE NEGATIVE Final    Comment: (NOTE) The Xpert Xpress SARS-CoV-2/FLU/RSV assay is intended as an aid in  the diagnosis of influenza from Nasopharyngeal swab specimens and  should not be used as a sole basis for treatment. Nasal washings and  aspirates are unacceptable for Xpert Xpress SARS-CoV-2/FLU/RSV  testing. Fact Sheet for Patients: PinkCheek.be Fact Sheet for Healthcare Providers: GravelBags.it This test is not yet approved or cleared by the [place] FDA and  has been authorized for detection and/or diagnosis of SARS-CoV-2 by  FDA under an Emergency Use Authorization (EUA). This EUA will remain  in effect (meaning this test can be used) for the duration of the  Covid-19 declaration under Section 564(b)(1) of the Act, 21  U.S.C. section 360bbb-3(b)(1), unless the authorization is  terminated or revoked. Performed at [facility], [place] [address], [place], [place] [address]       Radiology Studies: No results found.    Scheduled Meds: . acyclovir  400 mg Oral BID  . amiodarone  150 mg Intravenous Once  . fentaNYL  1 patch Transdermal Q72H  . gabapentin  100 mg Oral q morning - 10a   And  . gabapentin  200 mg Oral QHS  . sodium chloride flush  3 mL Intravenous Q12H   Continuous Infusions: . amiodarone     Followed by  . amiodarone    . ceFEPime (MAXIPIME) IV 2 g  (09/16/19 0813)  . lactated ringers 100 mL/hr at 09/16/19 0809  . sodium chloride    . vancomycin       LOS: 2 days      Time spent: 40 minutes   Dessa Phi, DO [facility] 09/16/2019, 10:00 AM   Available via Epic secure chat 7am-7pm After these hours, please refer to coverage provider listed on [email or web address]

## 2019-09-16 NOTE — Significant Event (Addendum)
Rapid Response Event Note  Overview:Called d/t tachycardia-130s-140s. Pt here with abdominal pain/?sepsis. Pt has a history of afib and is on eliquis at home. Eliquis being held in the hospital d/t bloody stools. Pt is on 25mg   toprol xl twice a day. Pt has been SR-80s t/o the night. Around 0330, pt flipped into Afib-110s and HR has continue to increase since then. Time Called: 0432 Arrival Time: 0435 Event Type: Cardiac  Initial Focused Assessment: Pt laying in bed with eyes closed, in no distress. Pt alert and oriented, denies SOB, chest pain, dizziness. Lungs clear, diminished in bases. Skin warm and dry.  T-98, HR-151, BP-92/69, RR-20, SpO2-96% on 2L [place]  Interventions: EKG-Afib Plan of Care (if not transferred): Pt asymptomatic at this time. Await NP orders and continue to monitor pt. Call RRT if further assistance needed.   Update-0507-SBP-80s-250cc NS bolus ordered. 0520-Spoke with [place], NP-1L NS bolus ordered to be given over 2 hours.   Event Summary: Name of Physician Notified: Kennon Holter, NP at (PTA RRT)    at          [place], Carren Rang

## 2019-09-16 NOTE — Progress Notes (Addendum)
MD called d/t pt being confused & combative. Pt is pulling off his telemetry monitor. Pt keeps asking about his wife. Wife agreed to stay w/ pt if needed but was told from staff she could go home. Pt is attempting to get out of  Bed  & refusing care. IV ativan given to pt. Will try mittens as well & continue to monitor the pt. Hoover Brunette, RN   Pt smacked meds out of the RN's hand & attempted to back hand NT when attempting to get his cbg. No meds nor his cbg was taken. Hoover Brunette, RN  Around 2155 while on the phone w/ pt's wife who was tearful the a/c said pt's wife could come stay with him d/t him being confused & combative. Pt's wife was begging to come stay with the pt. Per wife pt called their neighbor saying he was going to die tonight & his wife would not know it. Emotional support given to pt & pt's wife is on her way to the unit to come stay with pt. Hoover Brunette, RN   Called into room by pt's wifen around 0500. Pt agitated & attempting to get oob. Md on call notified. New orders for haldol 2mg  iv once. Will give & continue to monitor the pt. Hoover Brunette, RN

## 2019-09-16 NOTE — Plan of Care (Signed)

## 2019-09-16 NOTE — Consult Note (Signed)
Cardiology Consultation:   Patient ID: [name] [name] MRN: [id number]; DOB: [birth year]  Admit date: 09/13/2019 Date of Consult: 09/16/2019  Primary Care Provider: Cassandria Anger, MD Primary Cardiologist: Dr. Aundra Dubin   Patient Profile:   [name] [name] is a 79 y.o. male with a hx of aortic stenosis, status post TAVR, paroxysmal atrial fibrillation chronic diastolic CHF and multiple myeloma who is being seen today for the evaluation of atrial fibrillation with RVR at the request of [name] Bongo, MD.  History of Present Illness:   [name] [name] is a 80 y.o. male who has history of HTN, aortic stenosis and paroxysmal atrial fibrillation. He was hospitalized with atrial fibrillation/RVR in 5/14. He had TEE-guided cardioversion. TEE showed bicuspid aortic valve with mild AS and a moderately dilated ascending aorta. Most echo in 4/17 showed moderate aortic stenosis and MRA chest in 11/17 showed 4.1 cm ascending aorta.  He was on Xarelto for anticoagulation but stopped it as he was convinced it was causing joint pains.  In 5/17, he had been back in atrial fibrillation for several days and was symptomatic, started on Eliquis and planned TEE-guided DCCV given significant symptoms, but he converted back to NSR on his own. He continued Eliquis for about 1 month then stopped it on his own.  In 11/17, he was hospitalized with symptomatic atrial fibrillation with RVR, started on diltiazem CD and Eliquis with plan for TEE-guided DCCV. However, he converted back to NSR on his own. He stopped the diltiazem but has continued the Eliquis.  He was admitted in 2/18 with fever, LUL PNA and left-sided pleural effusion. Thoracentesis on left was suggestive of parapneumonic effusion. He was in the hospital 11 days. During that time, he went into atrial fibrillation with RVR. He was started on amiodarone and went back into NSR. Amiodarone was subsequently stopped.  Recurrent atrial fibrillation with RVR in  2/19, felt more fatigued. He underwent TEE-guided DCCV back to NSR.  He had a lower GI bleed in 11/19 post-polypectomy. He has since restarted on Eliquis.  Coronary CTA was done in 2/20, this showed mild nonobstructive CAD. Echo in 2/20 showed EF 60-65%, bicuspid aortic valve with severe AS.   LHC in 6/20 showed no significant coronary disease.  In 8/20, he had TAVR with Bode 3 THV x 2 (valve in valve due to peri-valvular leak initially).  Post-procedure echoes have shown elevated gradient across the aortic valve though dimensionless index has only been in the mildly stenotic range.  Last echo in 9/20 showed mean aortic valve gradient 26 mmHg with dimensionless index 0.54.   Patient has additionally been diagnosed recently with multiple myeloma with bone metastasis.  He is getting treated with Revlimid, Velcade, and prednisone.  He has developed significant neuropathy with chemotherapy on narcotics and fentanyl patch at home with only partial improvement of pain.  He also has significant balance issues secondary to pain. At last appointment, he was noted to be volume overloaded and thrombocytopenic.  Eliquis and ASA were held and Lasix was started.  Toprol XL was decreased due to bradycardia.   The patient presented yesterday with abdominal pain, bloody diarrhea, worsening lower extremity pain, he was found to be in atrial fibrillation with RVR on admission.  He has not received any chemo in the last 3 weeks.  He has been losing a lot of weight recently, he has been also experiencing vomiting cold sweats and some shortness of breath.  He was started on amiodarone drip.  He currently remains in  A. fib with RVR with ventricular rate in 130s to 140s.  He is in significant distress from pain in both of his lower extremities.  Heart Pathway Score:     Past Medical History:  Diagnosis Date  . Ascending aortic aneurysm ([place])   . Bicuspid aortic valve   . Cancer ([place])   . CHF NYHA  class I (no symptoms from ordinary activities), acute, diastolic ([place])   . Fatty liver    mild  . GI bleeding 07/21/2018   post polypectomy  . Hemorrhoids   . HTN (hypertension)   . Hypercholesteremia   . Hypokalemia   . Internal hemorrhoids   . LBP (low back pain)   . Moderate aortic stenosis   . Osteoarthritis   . Paroxysmal atrial fibrillation ([place])    a. new onset Afib in 07/2008. He underwent ibutilide cardioversion successfully. b. Recurrence 01/2013 s/p TEE/DCCV - was on Xarelto but he stopped it as he was convinced it was causing joint pn. c. Recurrence 01/2016 - spont conv to NSR. Pt took Eliquis x1 mo then declined further anticoag. d. Recurrence 07/2016.  [name] Pneumonia   . Tubular adenoma of colon     Past Surgical History:  Procedure Laterality Date  . BACK SURGERY  x12 years ago  . CARDIOVERSION N/A 01/26/2013   Procedure: CARDIOVERSION;  Surgeon: Larey Dresser, MD;  Location: [facility] ENDOSCOPY;  Service: Cardiovascular;  Laterality: N/A;  . CARDIOVERSION N/A 10/28/2017   Procedure: CARDIOVERSION;  Surgeon: Larey Dresser, MD;  Location: [facility] ENDOSCOPY;  Service: Cardiovascular;  Laterality: N/A;  . CARDIOVERSION N/A 03/03/2018   Procedure: CARDIOVERSION;  Surgeon: Lelon Perla, MD;  Location: [facility] ENDOSCOPY;  Service: Cardiovascular;  Laterality: N/A;  . COLONOSCOPY    . COLONOSCOPY  07/17/2018   at [facility]  . HEMORRHOID SURGERY    . LUMBAR LAMINECTOMY    . POLYPECTOMY    . RIGHT HEART CATH N/A 08/20/2019   Procedure: RIGHT HEART CATH;  Surgeon: Larey Dresser, MD;  Location: [place] CV LAB;  Service: Cardiovascular;  Laterality: N/A;  . RIGHT/LEFT HEART CATH AND CORONARY ANGIOGRAPHY N/A 03/07/2019   Procedure: RIGHT/LEFT HEART CATH AND CORONARY ANGIOGRAPHY;  Surgeon: Burnell Blanks, MD;  Location: [place] CV LAB;  Service: Cardiovascular;  Laterality: N/A;  . TEE WITHOUT CARDIOVERSION N/A 01/26/2013   Procedure: TRANSESOPHAGEAL ECHOCARDIOGRAM (TEE);   Surgeon: Larey Dresser, MD;  Location: [place];  Service: Cardiovascular;  Laterality: N/A;  . TEE WITHOUT CARDIOVERSION N/A 10/28/2017   Procedure: TRANSESOPHAGEAL ECHOCARDIOGRAM (TEE);  Surgeon: Larey Dresser, MD;  Location: [facility] ENDOSCOPY;  Service: Cardiovascular;  Laterality: N/A;  . TEE WITHOUT CARDIOVERSION N/A 05/08/2019   Procedure: TRANSESOPHAGEAL ECHOCARDIOGRAM (TEE);  Surgeon: Burnell Blanks, MD;  Location: [place] CV LAB;  Service: Open Heart Surgery;  Laterality: N/A;  . TRANSCATHETER AORTIC VALVE REPLACEMENT, TRANSFEMORAL N/A 05/08/2019   Procedure: TRANSCATHETER AORTIC VALVE REPLACEMENT, TRANSFEMORAL;  Surgeon: Burnell Blanks, MD;  Location: [place] CV LAB;  Service: Open Heart Surgery;  Laterality: N/A;    Inpatient Medications: Scheduled Meds: . acyclovir  400 mg Oral BID  . apixaban  5 mg Oral BID  . [START ON 09/17/2019] dexamethasone  12 mg Oral Q Mon  . DULoxetine  60 mg Oral Daily  . fentaNYL  1 patch Transdermal Q72H  . gabapentin  100 mg Oral q morning - 10a   And  . gabapentin  200 mg Oral QHS  . sodium chloride flush  3 mL Intravenous Q12H   Continuous Infusions: . amiodarone 60 mg/hr (09/16/19 1101)   Followed by  . amiodarone    . ceFEPime (MAXIPIME) IV 2 g (09/16/19 0813)  . lactated ringers 100 mL/hr at 09/16/19 0809  . sodium chloride     PRN Meds: acetaminophen **OR** acetaminophen, diclofenac Sodium, fentaNYL (SUBLIMAZE) injection, LORazepam, ondansetron **OR** ondansetron (ZOFRAN) IV, oxyCODONE, sodium chloride  Allergies:    Allergies  Allergen Reactions  . Xarelto [Rivaroxaban] Other (See Comments) and Hypertension    INCREASED BP-HYPERTENSIVE EVENTS  . Corticosteroids Other (See Comments)    Made the patient  "sick," feel "weird," and his "body rejected" them   . Ramipril Other (See Comments)    Could not eat or sleep, lost muscle mass  . Benazepril Cough    Social History:   Social History    Socioeconomic History  . Marital status: Married    Spouse name: Not on file  . Number of children: 0  . Years of education: Not on file  . Highest education level: Not on file  Occupational History  . Occupation: Retired Lobbyist: [employer]  Tobacco Use  . Smoking status: Never Smoker  . Smokeless tobacco: Never Used  Substance and Sexual Activity  . Alcohol use: Yes    Comment: Drinks 1 glass of wine nightly/socially  . Drug use: No  . Sexual activity: Yes  Other Topics Concern  . Not on file  Social History Narrative   Patient lives in [place] w/ his wife. He is a native of [place]. 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   Right-handed   Caffeine: 2 cups coffee per day   Two story home   Social Determinants of Health   Financial Resource Strain:   . Difficulty of Paying Living Expenses: Not on file  Food Insecurity:   . Worried About Charity fundraiser in the Last Year: Not on file  . Ran Out of Food in the Last Year: Not on file  Transportation Needs:   . Lack of Transportation (Medical): Not on file  . Lack of Transportation (Non-Medical): Not on file  Physical Activity:   . Days of Exercise per Week: Not on file  . Minutes of Exercise per Session: Not on file  Stress:   . Feeling of Stress : Not on file  Social Connections:   . Frequency of Communication with Friends and Family: Not on file  . Frequency of Social Gatherings with Friends and Family: Not on file  . Attends Religious Services: Not on file  . Active Member of Clubs or Organizations: Not on file  . Attends Archivist Meetings: Not on file  . Marital Status: Not on file  Intimate Partner Violence:   . Fear of Current or Ex-Partner: Not on file  . Emotionally Abused: Not on file  . Physically Abused: Not on file  . Sexually Abused: Not on file    Family History:    Family History  Problem Relation Age of Onset  .  Colon cancer Mother 53  . Hypertension Other   . Coronary artery disease Neg Hx   . Colon polyps Neg Hx   . Esophageal cancer Neg Hx   . Rectal cancer Neg Hx   . Stomach cancer Neg Hx      ROS:  Please see the history of present illness.  All other ROS reviewed and negative.  Physical Exam/Data:   Vitals:   09/16/19 1031 09/16/19 1045 09/16/19 1102 09/16/19 1147  BP: 107/79 111/74 98/70 101/62  Pulse: (!) 138 (!) 104 (!) 116 93  Resp:      Temp:      TempSrc:      SpO2: 100%  99% 97%  Weight:      Height:        Intake/Output Summary (Last 24 hours) at 09/16/2019 1201 Last data filed at 09/16/2019 0731 Gross per 24 hour  Intake 2718.31 ml  Output 1400 ml  Net 1318.31 ml   Last 3 Weights 09/15/2019 09/14/2019 09/13/2019  Weight (lbs) 166 lb 14.2 oz 159 lb 9.8 oz 158 lb  Weight (kg) 75.7 kg 72.4 kg 71.668 kg     Body mass index is 26.94 kg/m.  General:  Well nourished, well developed, in acute distress secondary to bilateral lower extremity neuropathy HEENT: normal Lymph: no adenopathy Neck: no JVD Endocrine:  No thryomegaly Vascular: No carotid bruits; FA pulses 2+ bilaterally without bruits  Cardiac:  normal S1, S2; iRRR; no murmur  Lungs:  clear to auscultation bilaterally, no wheezing, rhonchi or rales  Abd: soft, nontender, no hepatomegaly  Ext: no edema Musculoskeletal:  No deformities, BUE and BLE strength normal and equal Skin: warm and dry  Neuro:  CNs 2-12 intact, no focal abnormalities noted Psych:  Normal affect   EKG:  The EKG was personally reviewed and demonstrates: Atrial fibrillation with RVR 150 bpm, diffuse ST depressions Telemetry:  Telemetry was personally reviewed and demonstrates: A. fib with RVR with ventricular ranges from 122 150 bpm  Relevant CV Studies:  1. The left ventricle has hyperdynamic systolic function, with an ejection fraction of >65%. The cavity size was normal. There is mildly increased left ventricular wall thickness. Left  ventricular diastolic Doppler parameters are consistent with  impaired relaxation. Elevated mean left atrial pressure No evidence of left ventricular regional wall motion abnormalities.  2. The right ventricle has normal systolic function. The cavity was mildly enlarged. There is No increase in right ventricular wall thickness.  3. Left atrial size was normal.  4. Right atrial size was normal.  5. The mitral valve is degenerative. mild thickening of the mitral valve leaflet. mild calcification of the mitral valve leaflet. There is mild mitral annular calcification present. No evidence of mitral valve stenosis.  6. Peak trans TAVR velocity - 3.63ms, mean 264mg.  7. Pulmonic valve regurgitation is trivial by color flow Doppler.  8. The aorta is abnormal unless otherwise noted.  9. There is dilatation of the ascending aorta measuring 44 mm.  Laboratory Data:  High Sensitivity Troponin:  No results for input(s): TROPONINIHS in the last 720 hours.   Chemistry Recent Labs  Lab 09/13/19 2325 09/15/19 0420 09/16/19 0845  NA 140 139 137  K 3.7 4.8 3.9  CL 102 109 109  CO2 19* 18* 19*  GLUCOSE 192* 110* 91  BUN 44* 41* 19  CREATININE 2.02* 1.53* 0.91  CALCIUM 10.5* 8.4* 7.8*  GFRNONAA 30* 43* >60  GFRAA 35* 49* >60  ANIONGAP 19* 12 9    Recent Labs  Lab 09/13/19 2325 09/16/19 0845  PROT 7.4 4.3*  ALBUMIN 5.2* 2.7*  AST 39 30  ALT 29 25  ALKPHOS 126 95  BILITOT 1.6* 0.7   Hematology Recent Labs  Lab 09/15/19 0420 09/15/19 1109 09/16/19 0845  WBC 10.0 10.2 7.5  RBC 4.61 4.33 3.82*  HGB 14.0 13.1 11.7*  HCT 42.7 39.1  35.3*  MCV 92.6 90.3 92.4  MCH 30.4 30.3 30.6  MCHC 32.8 33.5 33.1  RDW 12.9 12.8 13.1  PLT 82* 87* 80*   BNPNo results for input(s): BNP, PROBNP in the last 168 hours.  DDimer No results for input(s): DDIMER in the last 168 hours.  Radiology/Studies:  CT ABDOMEN PELVIS WO CONTRAST  Result Date: 09/14/2019 CLINICAL DATA:  Abdominal pain. Nausea,  vomiting, diarrhea and liquid stool. History of multiple myeloma.  IMPRESSION: 1. Large stool ball distending the rectum with rectal distention and mild perirectal fat stranding. Findings consistent with fecal impaction. There is also wall thickening of the adjacent sigmoid colon with mild pericolonic edema. Possibility of stercoral colitis is raised. 2. Findings suggesting gastroparesis with gastric distension with air and contrast, as well as distended distal esophagus that is contrast filled. No evidence of gastric outlet obstruction or mechanical obstructing lesion. No bowel obstruction, administered enteric contrast reaches the cecum. 3. Decreased size of previous retroperitoneal lymph node since prior PET, no longer visualized. 4. Multiple pelvic bone lesions consistent with history of multiple myeloma, grossly unchanged in size from prior exam. Aortic Atherosclerosis (ICD10-I70.0). Electronically Signed   By: Keith Rake M.D.   On: 09/14/2019 04:05   CT HEAD WO CONTRAST  Result Date: 09/14/2019 CLINICAL DATA:  Presyncope  IMPRESSION: 1.  No acute intracranial findings. 2.  Chronic microvascular ischemic change and cerebral volume loss. Electronically Signed   By: Davina Poke D.O.   On: 09/14/2019 09:59   DG Chest Port 1 View  Result Date: 09/14/2019 CLINICAL DATA:  Weakness.  IMPRESSION: No acute chest findings. Electronically Signed   By: Keith Rake M.D.   On: 09/14/2019 02:23    Assessment and Plan:   1. Atrial fibrillation: Paroxysmal. He is quite symptomatic when in atrial fibrillation and now reports worsening anxiety and lower extremity pain when he is in A. fib. CHADSVASC = 3.  - Continue Eliquis.  - He has not wanted atrial fibrillation ablation.   -I would continue IV amiodarone, he now has metastatic cancer and it probably makes sense that we keep him on p.o. amiodarone long-term to avoid further recurrence of atrial fibrillation as his symptoms are pretty  significant. -I would restart Eliquis, he has not had any further episodes of bloody diarrheas, hemoglobin 11.7.  2. Bicuspid aortic valve disorder:Ascending aorta was 4.3 cm on recent coronary CTA. He is now s/p TAVR with 2 Newton (valve-in-valve due to peri-valvular leak after 1st valve placed). Post-op, mean gradient across the aortic valve has been elevated, most recently 26 mmHg in 9/20.  Dimensionless index, most recently 0.54, only suggests mild bioprosthetic aortic stenosis.  I suspect the elevated gradient is due to a combination of high flow and patient-prosthetic mismatch due to valve-in-valve placement.   3. Chronic diastolic CHF:  -He appears euvolemic.  Lasix and spironolactone have been held this visit secondary to acute renal failure and dehydration associated with diarrhea and vomiting.  4.  Acute renal failure, secondary to diarrhea and vomiting -Resolved with hydration  5. Multiple myeloma: He is on Revlimid, prednisone, and Velcade per Dr. Irene Limbo.   6. MLY:[id number]  7. Peripheral neuropathy: Suspect painful peripheral neuropathy.  Having difficulty with balance.  B12 normal.  This is his main limitation at this point.   For questions or updates, please contact [place] Please consult [email or web address] for contact info under   Signed, Ena Dawley, MD  09/16/2019 12:01 PM

## 2019-09-16 NOTE — Progress Notes (Signed)
Notify  Provider about vtach off and on 3 to 9 beats, pt stable, b/p 92/55, hr 138.follow up new order

## 2019-09-17 DIAGNOSIS — R570 Cardiogenic shock: Secondary | ICD-10-CM

## 2019-09-17 LAB — GI PATHOGEN PANEL BY PCR, STOOL

## 2019-09-17 LAB — GLUCOSE, CAPILLARY
Glucose-Capillary: 88 mg/dL (ref 70–99)
Glucose-Capillary: 101 mg/dL — ABNORMAL HIGH (ref 70–99)
Glucose-Capillary: 84 mg/dL (ref 70–99)
Glucose-Capillary: 90 mg/dL (ref 70–99)

## 2019-09-17 LAB — CBC
HCT: 35.3 % — ABNORMAL LOW (ref 39.0–52.0)
Hemoglobin: 11.8 g/dL — ABNORMAL LOW (ref 13.0–17.0)
MCH: 30 pg (ref 26.0–34.0)
MCHC: 33.4 g/dL (ref 30.0–36.0)
MCV: 89.8 fL (ref 80.0–100.0)
Platelets: 86 10*3/uL — ABNORMAL LOW (ref 150–400)
RBC: 3.93 MIL/uL — ABNORMAL LOW (ref 4.22–5.81)
RDW: 12.7 % (ref 11.5–15.5)
WBC: 7.8 10*3/uL (ref 4.0–10.5)
nRBC: 0 % (ref 0.0–0.2)

## 2019-09-17 LAB — BASIC METABOLIC PANEL
Anion gap: 10 (ref 5–15)
BUN: 11 mg/dL (ref 8–23)
CO2: 20 mmol/L — ABNORMAL LOW (ref 22–32)
Calcium: 7.9 mg/dL — ABNORMAL LOW (ref 8.9–10.3)
Chloride: 108 mmol/L (ref 98–111)
Creatinine, Ser: 0.89 mg/dL (ref 0.61–1.24)
GFR calc Af Amer: >60 mL/min (ref >60)
GFR calc non Af Amer: >60 mL/min (ref >60)
Glucose, Bld: 91 mg/dL (ref 70–99)
Potassium: 3.2 mmol/L — ABNORMAL LOW (ref 3.5–5.1)
Sodium: 138 mmol/L (ref 135–145)

## 2019-09-17 MED ORDER — HALOPERIDOL LACTATE 5 MG/ML IJ SOLN
2.0000 mg | Freq: Once | INTRAMUSCULAR | Status: AC
  Administered 2019-09-17: 2 mg via INTRAVENOUS
  Filled 2019-09-17: qty 1

## 2019-09-17 MED ORDER — POTASSIUM CHLORIDE CRYS ER 20 MEQ PO TBCR
30.0000 meq | EXTENDED_RELEASE_TABLET | Freq: Two times a day (BID) | ORAL | Status: DC
  Filled 2019-09-17: qty 1

## 2019-09-17 MED ORDER — HALOPERIDOL LACTATE 5 MG/ML IJ SOLN
2.0000 mg | Freq: Four times a day (QID) | INTRAMUSCULAR | Status: DC | PRN
  Administered 2019-09-17: 2 mg via INTRAVENOUS
  Filled 2019-09-17: qty 1

## 2019-09-17 MED ORDER — QUETIAPINE FUMARATE 25 MG PO TABS: 50.0000 mg | ORAL_TABLET | Freq: Every day | ORAL | Status: DC

## 2019-09-17 MED ORDER — HEPARIN BOLUS VIA INFUSION
3000.0000 [IU] | Freq: Once | INTRAVENOUS | Status: AC
  Administered 2019-09-17: 3000 [IU] via INTRAVENOUS
  Filled 2019-09-17: qty 3000

## 2019-09-17 MED ORDER — HEPARIN (PORCINE) 25000 UT/250ML-% IV SOLN
1050.0000 [IU]/h | INTRAVENOUS | Status: DC
  Administered 2019-09-17 – 2019-09-18 (×2): 1050 [IU]/h via INTRAVENOUS
  Filled 2019-09-17: qty 250

## 2019-09-17 MED ORDER — POTASSIUM CHLORIDE 10 MEQ/100ML IV SOLN
10.0000 meq | INTRAVENOUS | Status: AC
  Administered 2019-09-17 (×6): 10 meq via INTRAVENOUS
  Filled 2019-09-17 (×6): qty 100

## 2019-09-17 NOTE — Progress Notes (Signed)
Pt. Attempting to get out of bed unassisted. Bed alarm on. Pt. Reoriented and Assisted to recliner. Chair alarm on.

## 2019-09-17 NOTE — Progress Notes (Signed)
OT Cancellation Note  Patient Details Name: Timothy Hosten MRN: [id number] DOB: December 14, 1940   Cancelled Treatment:    Reason Eval/Treat Not Completed: Fatigue/lethargy limiting ability to participate(second attempt, pt medicated and unable to participate, agitated today.  Malka So 09/17/2019, 3:07 PM  Nestor Lewandowsky, OTR/L Acute Rehabilitation Services Pager: [phone] Office: [phone]

## 2019-09-17 NOTE — Progress Notes (Signed)
PROGRESS NOTE    [name]  OEV:[id number] DOB: Nov 30, 1940 DOA: 09/13/2019 PCP: Cassandria Anger, MD     Brief Narrative:  [name] [name] is a 79 y.o. male with medical history significant of Paroxysmal A fib on Eliquis; HTN; HLD; chronic diastolic CHF; s/p TAVR in Aug 2020; and multiple myeloma with metastatic disease followed by Dr. Irene Limbo presenting with abdominal pain. Wife reports that he was diagnosed with bone cancer in August.  They found it while undergoing work-up for TAVR.  He was diagnosed with multiple myeloma, was undergoing infusions, none in 2-3 weeks.  He has lost a lot of weight.  Last night, he was vomiting profusely, cold/sweaty, hard of breathing.  He started acutely last night, and developed diarrhea about a 30 minutes later.  No fevers.  He was admitted for severe sepsis, thought to be abdominal etiology as he presented with bloody diarrhea as well as altered mental status.  C. difficile was negative.  1/3: Overnight, patient went into A. fib RVR with relative hypotension.  His heart rate is ranging 140-160s, irregular in rhythm.  His blood pressure also has been relatively low, most recent SBP 94.  Amiodarone gtt started and cardiology consulted  New events last 24 hours / Subjective: Overnight, patient had significant agitation, combativeness.  Wife at bedside states that patient was finally able to calm down around 5:30 AM.  He has apparently had some of these episodes in the past hospitalization but none as severe.  His leg pain this morning seems improved.  No abdominal pain complaints today, diarrhea improved as well.  Assessment & Plan:   Principal Problem:   Abdominal pain, diffuse Active Problems:   Essential hypertension   PAF (paroxysmal atrial fibrillation) (HCC)   Atrial fibrillation with RVR (HCC)   Acute kidney injury superimposed on CKD (HCC)   S/P TAVR (transcatheter aortic valve replacement)   Multiple myeloma not having achieved remission  (HCC)   Bone metastases (HCC)   Peripheral neuropathy   Bloody diarrhea   Acute metabolic encephalopathy   Goals of care, counseling/discussion   DNR (do not resuscitate)   Pressure injury of skin   Severe sepsis secondary to abdominal etiology and bloody diarrhea as well as altered mental status on admission, sepsis present on admission -C. difficile negative, GI PCR negative -Abdominal pain with diarrhea seems to be improving. WBC 7.8 and remains afebrile  -Started on empiric vancomycin and cefepime --> continue cefepime  Acute metabolic encephalopathy -Patient was noted to be obtunded at the time of admission -Had episodes of combativeness and confusion overnight.  Likely due to sundowning.  Seems to be much more calm on my examination this morning.  A. fib RVR -History of paroxysmal atrial fibrillation, followed by Dr. Aundra Dubin.  Pt has required cardioversion in the past -Eliquis -Due to hypotension, stop metoprolol.  Started on amiodarone drip -Cardiology following  AKI -Baseline creatinine 0.87  -Cr peaked at 2.02 and now AKI resolved after IVF.  Morning BMP pending  Multiple myeloma, plasma cell myeloma -Patient follows with Dr. Irene Limbo. Has associated history of hypercalcemia, currently on Zometa. Oncology curbsided on admission with recommendations for supportive care. On Acyclovir and weekly Decadron, Revlimid   Peripheral neuropathy appears to be secondary to chemotherapy -Continue home Neurontin (watch for renal function), Cymbalta and oxycodone  Chronic diastolic heart failure -No acute exacerbation at this time -Home Lasix and spironolactone held due to AKI on admission  Bicuspid aortic valve disorder -Status post TAVR August 2020  Dysuria -  Urinalysis does not suggest infection. No known history of BPH. Urine culture negative     In agreement with assessment of the pressure ulcer as below:  Pressure Injury 09/14/19 Sacrum Anterior Stage 2 -  Partial  thickness loss of dermis presenting as a shallow open injury with a red, pink wound bed without slough. (Active)  09/14/19 1621  Location: Sacrum  Location Orientation: Anterior  Staging: Stage 2 -  Partial thickness loss of dermis presenting as a shallow open injury with a red, pink wound bed without slough.  Wound Description (Comments):   Present on Admission: Yes         DVT prophylaxis: Eliquis Code Status: DNR Family Communication: Wife at bedside Disposition Plan: Pending improvement in sepsis, A. fib RVR.  Remains on amiodarone drip today.   Consultants:   Oncology (curbside)  Cardiology  Procedures:   None  Antimicrobials:  Anti-infectives (From admission, onward)   Start     Dose/Rate Route Frequency Ordered Stop   09/16/19 1000  vancomycin (VANCOREADY) IVPB 1500 mg/300 mL  Status:  Discontinued     1,500 mg 150 mL/hr over 120 Minutes Intravenous Every 48 hours 09/14/19 0857 09/16/19 1011   09/14/19 2000  ceFEPIme (MAXIPIME) 2 g in sodium chloride 0.9 % 100 mL IVPB     2 g 200 mL/hr over 30 Minutes Intravenous Every 12 hours 09/14/19 0857     09/14/19 1000  acyclovir (ZOVIRAX) tablet 400 mg     400 mg Oral 2 times daily 09/14/19 0854     09/14/19 0630  vancomycin (VANCOREADY) IVPB 1500 mg/300 mL     1,500 mg 150 mL/hr over 120 Minutes Intravenous  Once 09/14/19 0624 09/14/19 1230   09/14/19 0630  ceFEPIme (MAXIPIME) 2 g in sodium chloride 0.9 % 100 mL IVPB     2 g 200 mL/hr over 30 Minutes Intravenous  Once 09/14/19 0624 09/14/19 0930       Objective: Vitals:   09/16/19 1800 09/16/19 2001 09/17/19 0454 09/17/19 0900  BP: 108/74 107/77 121/82   Pulse: (!) 110 (!) 111 (!) 119   Resp:  18 18   Temp:  98.5 F (36.9 C) 98.5 F (36.9 C)   TempSrc:  Oral    SpO2:  100% 93%   Weight:    73.3 kg  Height:        Intake/Output Summary (Last 24 hours) at 09/17/2019 0949 Last data filed at 09/17/2019 0900 Gross per 24 hour  Intake 2597.44 ml  Output 1254  ml  Net 1343.44 ml   Filed Weights   09/14/19 1533 09/15/19 0523 09/17/19 0900  Weight: 72.4 kg 75.7 kg 73.3 kg    Examination: General exam: Appears calm and comfortable this morning Respiratory system: Clear to auscultation. Respiratory effort normal. Cardiovascular system: S1 & S2 heard, irregular rhythm, rate 120s. No pedal edema. Gastrointestinal system: Abdomen is nondistended, soft and nontender. Normal bowel sounds heard. Central nervous system: Alert but not oriented to place. Non focal exam. Speech clear   Extremities: Symmetric in appearance bilaterally  Skin: No rashes, lesions or ulcers on exposed skin  Psychiatry: Stable and calm this morning    Data Reviewed: I have personally reviewed following labs and imaging studies  CBC: Recent Labs  Lab 09/13/19 2325 09/14/19 2135 09/15/19 0420 09/15/19 1109 09/16/19 0845 09/17/19 0704  WBC 6.1 12.2* 10.0 10.2 7.5 7.8  NEUTROABS 5.1  --   --   --   --   --  HGB 16.9 14.5 14.0 13.1 11.7* 11.8*  HCT 50.8 42.7 42.7 39.1 35.3* 35.3*  MCV 91.4 89.5 92.6 90.3 92.4 89.8  PLT 173 93* 82* 87* 80* 86*   Basic Metabolic Panel: Recent Labs  Lab 09/13/19 2325 09/15/19 0420 09/16/19 0845  NA 140 139 137  K 3.7 4.8 3.9  CL 102 109 109  CO2 19* 18* 19*  GLUCOSE 192* 110* 91  BUN 44* 41* 19  CREATININE 2.02* 1.53* 0.91  CALCIUM 10.5* 8.4* 7.8*   GFR: Estimated Creatinine Clearance: 59.4 mL/min (by C-G formula based on SCr of 0.91 mg/dL). Liver Function Tests: Recent Labs  Lab 09/13/19 2325 09/16/19 0845  AST 39 30  ALT 29 25  ALKPHOS 126 95  BILITOT 1.6* 0.7  PROT 7.4 4.3*  ALBUMIN 5.2* 2.7*   No results for input(s): LIPASE, AMYLASE in the last 168 hours. No results for input(s): AMMONIA in the last 168 hours. Coagulation Profile: Recent Labs  Lab 09/14/19 1027  INR 1.5*   Cardiac Enzymes: No results for input(s): CKTOTAL, CKMB, CKMBINDEX, TROPONINI in the last 168 hours. BNP (last 3 results) No  results for input(s): PROBNP in the last 8760 hours. HbA1C: No results for input(s): HGBA1C in the last 72 hours. CBG: Recent Labs  Lab 09/15/19 2121 09/16/19 0530 09/16/19 1211 09/16/19 1642 09/17/19 0619  GLUCAP 77 72 88 77 88   Lipid Profile: No results for input(s): CHOL, HDL, LDLCALC, TRIG, CHOLHDL, LDLDIRECT in the last 72 hours. Thyroid Function Tests: No results for input(s): TSH, T4TOTAL, FREET4, T3FREE, THYROIDAB in the last 72 hours. Anemia Panel: No results for input(s): VITAMINB12, FOLATE, FERRITIN, TIBC, IRON, RETICCTPCT in the last 72 hours. Sepsis Labs: Recent Labs  Lab 09/13/19 2325 09/14/19 0609 09/14/19 1027 09/14/19 1615  PROCALCITON  --   --  48.04  --   LATICACIDVEN 6.1* 3.5* 1.7 1.5    Recent Results (from the past 240 hour(s))  GI pathogen panel by PCR, stool     Status: None   Collection Time: 09/14/19  1:00 AM   Specimen: Stool  Result Value Ref Range Status   Plesiomonas shigelloides NOT DETECTED NOT DETECTED Final   Yersinia enterocolitica NOT DETECTED NOT DETECTED Final   Vibrio NOT DETECTED NOT DETECTED Final   Enteropathogenic E coli NOT DETECTED NOT DETECTED Final   E coli (ETEC) LT/ST NOT DETECTED NOT DETECTED Final   E coli 7371 by PCR Not applicable NOT DETECTED Final   Cryptosporidium by PCR NOT DETECTED NOT DETECTED Final   Entamoeba histolytica NOT DETECTED NOT DETECTED Final   Adenovirus F 40/41 NOT DETECTED NOT DETECTED Final   Norovirus GI/GII NOT DETECTED NOT DETECTED Final   Sapovirus NOT DETECTED NOT DETECTED Final    Comment: (NOTE) Performed At: [facility] [place], [place] [id number] Rush Farmer MD OE:[id number]    Vibrio cholerae NOT DETECTED NOT DETECTED Final   Campylobacter by PCR NOT DETECTED NOT DETECTED Final   Salmonella by PCR NOT DETECTED NOT DETECTED Final   E coli (STEC) NOT DETECTED NOT DETECTED Final   Enteroaggregative E coli NOT DETECTED NOT DETECTED Final   Shigella by  PCR NOT DETECTED NOT DETECTED Final   Cyclospora cayetanensis NOT DETECTED NOT DETECTED Final   Astrovirus NOT DETECTED NOT DETECTED Final   G lamblia by PCR NOT DETECTED NOT DETECTED Final   Rotavirus A by PCR NOT DETECTED NOT DETECTED Final  C difficile quick scan w PCR reflex  Status: None   Collection Time: 09/14/19  1:00 AM   Specimen: Stool  Result Value Ref Range Status   C Diff antigen NEGATIVE NEGATIVE Final   C Diff toxin NEGATIVE NEGATIVE Final   C Diff interpretation No C. difficile detected.  Final    Comment: Performed at [facility], [place] [address] [place], [place] [address]  Respiratory Panel by RT PCR (Flu A&B, Covid) - Nasopharyngeal Swab     Status: None   Collection Time: 09/14/19  1:16 AM   Specimen: Nasopharyngeal Swab  Result Value Ref Range Status   SARS Coronavirus 2 by RT PCR NEGATIVE NEGATIVE Final    Comment: (NOTE) SARS-CoV-2 target nucleic acids are NOT DETECTED. The SARS-CoV-2 RNA is generally detectable in upper respiratoy specimens during the acute phase of infection. The lowest concentration of SARS-CoV-2 viral copies this assay can detect is 131 copies/mL. A negative result does not preclude SARS-Cov-2 infection and should not be used as the sole basis for treatment or other patient management decisions. A negative result may occur with  improper specimen collection/handling, submission of specimen other than nasopharyngeal swab, presence of viral mutation(s) within the areas targeted by this assay, and inadequate number of viral copies (<131 copies/mL). A negative result must be combined with clinical observations, patient history, and epidemiological information. The expected result is Negative. Fact Sheet for Patients:  PinkCheek.be Fact Sheet for Healthcare Providers:  GravelBags.it This test is not yet ap proved or cleared by the [place] FDA and  has been authorized  for detection and/or diagnosis of SARS-CoV-2 by FDA under an Emergency Use Authorization (EUA). This EUA will remain  in effect (meaning this test can be used) for the duration of the COVID-19 declaration under Section 564(b)(1) of the Act, 21 U.S.C. section 360bbb-3(b)(1), unless the authorization is terminated or revoked sooner.    Influenza A by PCR NEGATIVE NEGATIVE Final   Influenza B by PCR NEGATIVE NEGATIVE Final    Comment: (NOTE) The Xpert Xpress SARS-CoV-2/FLU/RSV assay is intended as an aid in  the diagnosis of influenza from Nasopharyngeal swab specimens and  should not be used as a sole basis for treatment. Nasal washings and  aspirates are unacceptable for Xpert Xpress SARS-CoV-2/FLU/RSV  testing. Fact Sheet for Patients: PinkCheek.be Fact Sheet for Healthcare Providers: GravelBags.it This test is not yet approved or cleared by the [place] FDA and  has been authorized for detection and/or diagnosis of SARS-CoV-2 by  FDA under an Emergency Use Authorization (EUA). This EUA will remain  in effect (meaning this test can be used) for the duration of the  Covid-19 declaration under Section 564(b)(1) of the Act, 21  U.S.C. section 360bbb-3(b)(1), unless the authorization is  terminated or revoked. Performed at [facility], [place] [address], [place], [place] [address]   Culture, Urine     Status: None   Collection Time: 09/15/19 11:36 AM   Specimen: Urine, Clean Catch  Result Value Ref Range Status   Specimen Description URINE, CLEAN CATCH  Final   Special Requests NONE  Final   Culture   Final    NO GROWTH Performed at [facility], [place] [address] [place], [place] [address]    Report Status 09/16/2019 FINAL  Final      Radiology Studies: No results found.    Scheduled Meds: . acyclovir  400 mg Oral BID  . apixaban  5 mg Oral BID  . dexamethasone  12 mg Oral Q  Mon  . DULoxetine  60 mg  Oral Daily  . fentaNYL  1 patch Transdermal Q72H  . gabapentin  100 mg Oral q morning - 10a   And  . gabapentin  200 mg Oral QHS  . sodium chloride flush  3 mL Intravenous Q12H   Continuous Infusions: . amiodarone 30 mg/hr (09/17/19 0504)  . ceFEPime (MAXIPIME) IV 2 g (09/17/19 0909)  . lactated ringers Stopped (09/17/19 0500)  . sodium chloride       LOS: 3 days      Time spent: 25 minutes   Dessa Phi, DO [facility] 09/17/2019, 9:49 AM   Available via Epic secure chat 7am-7pm After these hours, please refer to coverage provider listed on [email or web address]

## 2019-09-17 NOTE — Progress Notes (Signed)
ANTICOAGULATION CONSULT NOTE - Initial Consult  Pharmacy Consult for heparin Indication: atrial fibrillation  Allergies  Allergen Reactions  . Xarelto [Rivaroxaban] Other (See Comments) and Hypertension    INCREASED BP-HYPERTENSIVE EVENTS  . Corticosteroids Other (See Comments)    Made the patient  "sick," feel "weird," and his "body rejected" them   . Ramipril Other (See Comments)    Could not eat or sleep, lost muscle mass  . Benazepril Cough    Patient Measurements: Height: 5\' 6"  (167.6 cm) Weight: 161 lb 11.2 oz (73.3 kg) IBW/kg (Calculated) : 63.8 Heparin Dosing Weight: 73.3 kg   Vital Signs: Temp: 97.4 F (36.3 C) (01/04 0957) Temp Source: Oral (01/04 0957) BP: 108/84 (01/04 1111) Pulse Rate: 112 (01/04 1111)  Labs: Recent Labs    09/15/19 0420 09/15/19 1109 09/16/19 0845 09/17/19 0704  HGB 14.0 13.1 11.7* 11.8*  HCT 42.7 39.1 35.3* 35.3*  PLT 82* 87* 80* 86*  CREATININE 1.53*  --  0.91 0.89    Estimated Creatinine Clearance: 60.7 mL/min (by C-G formula based on SCr of 0.89 mg/dL).   Medical History: Past Medical History:  Diagnosis Date  . Ascending aortic aneurysm ([place])   . Bicuspid aortic valve   . Cancer ([place])   . CHF NYHA class I (no symptoms from ordinary activities), acute, diastolic ([place])   . Fatty liver    mild  . GI bleeding 07/21/2018   post polypectomy  . Hemorrhoids   . HTN (hypertension)   . Hypercholesteremia   . Hypokalemia   . Internal hemorrhoids   . LBP (low back pain)   . Moderate aortic stenosis   . Osteoarthritis   . Paroxysmal atrial fibrillation ([place])    a. new onset Afib in 07/2008. He underwent ibutilide cardioversion successfully. b. Recurrence 01/2013 s/p TEE/DCCV - was on Xarelto but he stopped it as he was convinced it was causing joint pn. c. Recurrence 01/2016 - spont conv to NSR. Pt took Eliquis x1 mo then declined further anticoag. d. Recurrence 07/2016.  [name] Pneumonia   . Tubular adenoma of colon      Medications:  Scheduled:  . acyclovir  400 mg Oral BID  . dexamethasone  12 mg Oral Q Mon  . DULoxetine  60 mg Oral Daily  . fentaNYL  1 patch Transdermal Q72H  . gabapentin  100 mg Oral q morning - 10a   And  . gabapentin  200 mg Oral QHS  . heparin  3,000 Units Intravenous Once  . sodium chloride flush  3 mL Intravenous Q12H    Assessment: 1 yom presenting with abdominal pain and in Afib RVR. On apixaban PTA - LD on 12/31.   Has yet to receive apixaban in hospital. Hgb 11.8, plt 86- will monitor closely. Will give bolus since has been over 72 hours since last dose. No s/sx of bleeding.   Goal of Therapy:  Heparin level 0.3-0.7 units/ml aPTT 66-102 seconds Monitor platelets by anticoagulation protocol: Yes   Plan:  Give 3000 units bolus x 1 Start heparin infusion at 1050 units/hr Check anti-Xa level in 8 hours and daily while on heparin Continue to monitor H&H and platelets  Antonietta Jewel, PharmD, [place] Pharmacist  Phone: [phone]  Please check AMION for all [place] phone numbers After 10:00 PM, call [place] [phone] 09/17/2019,3:49 PM

## 2019-09-17 NOTE — Progress Notes (Signed)
OT Cancellation Note  Patient Details Name: [name] [name] MRN: [id number] DOB: 05/28/1941   Cancelled Treatment:    Reason Eval/Treat Not Completed: Fatigue/lethargy limiting ability to participate(pt given Ativan)  Malka So 09/17/2019, 11:53 AM  Nestor Lewandowsky, OTR/L Acute Rehabilitation Services Pager: [phone] Office: [phone]

## 2019-09-17 NOTE — Progress Notes (Signed)
PT Cancellation Note  Patient Details Name: [name] [name] MRN: [id number] DOB: 07-15-1941   Cancelled Treatment:    Reason Eval/Treat Not Completed: Fatigue/lethargy limiting ability to participate  Pt given ativan due to being combative this AM. Will follow.  Marguarite Arbour A Yandel Zeiner 09/17/2019, 12:16 PM Marisa Severin, PT, DPT Acute Rehabilitation Services Pager [phone] Office [phone]

## 2019-09-17 NOTE — Consult Note (Addendum)
Advanced Heart Failure Team Consult Note   Primary Physician: Cassandria Anger, MD PCP-Cardiologist:  No primary care provider on file.  Reason for Consultation: Heart Failure   HPI:    [name] [name] is seen today for evaluation of heart failure at the request of Dr Meda Coffee.   [name] [name] is a 79 year old with a history of HTN, AS TAVR 04/2019 GI bleed 2019 (polyp removed), CAD, multiple myeloma, and PAF(DC-CV 2014&2019).    He had LHC in 6/20 that showed no significant coronary disease. In 8/20, he had TAVR with [place] 3 THV x 2 (valve in valve due to peri-valvular leak initially). Post-procedure echoes have shown elevated gradient across the aortic valve though dimensionless index has only been in the mildly stenotic range.  Diagnosed with multiple myeloma in 7/20. He has been getting treated with Revlimid, Velcade, and prednisone.  He has developed significant neuropathy with chemotherapy on narcotics and fentanyl patch at home with only partial improvement of pain.  He also has significant balance issues secondary to pain.   He was last seen by Dr Aundra Dubin 08/2019 and due to thrombocytopenia eliquis was stopped.   Prior to admit he had an unwitnessed fall and hit his head.   Presented to [facility] ED on 09/14/19 with N/V, abdominal pain and bloody diarrhea.  Hypotensive in the ED. Creatinine on admit 1.5. Lactic acid on admit 6.1>3.5>1.7>1.5. CT of head was negative for acute findings.  CT abdomen- fecal impaction, gastroparesis, possible colitis, and multiple pelvic bone lesions. Started oin IV fluids. Yesterday cardiology consulted for A fib RVR. Hypotensive. BB was held and amiodarone drip was started. Last night he a small amount of bleeding from his rectum so eliquis was stopped. Hgb stable at 11.8. Overnight he had increasing agitation and confusion. Today he refused all medications. Due to agitation he was given 1 mg ativan.    Echo 05/2019 - EF >65% , RV normal. L and R  atrium normal.   Review of Systems: [y] = yes, '[ ]'$  = no-- History obtained from the chart because he is sedated this morningl   . General: Weight gain '[ ]'$ ; Weight loss [Y ]; Anorexia '[ ]'$ ; Fatigue [ Y]; Fever '[ ]'$ ; Chills '[ ]'$ ; Weakness [Y ]  . Cardiac: Chest pain/pressure '[ ]'$ ; Resting SOB '[ ]'$ ; Exertional SOB '[ ]'$ ; Orthopnea '[ ]'$ ; Pedal Edema '[ ]'$ ; Palpitations '[ ]'$ ; Syncope '[ ]'$ ; Presyncope '[ ]'$ ; Paroxysmal nocturnal dyspnea'[ ]'$   . Pulmonary: Cough '[ ]'$ ; Wheezing'[ ]'$ ; Hemoptysis'[ ]'$ ; Sputum '[ ]'$ ; Snoring '[ ]'$   . GI: Vomiting'[ ]'$ ; Dysphagia'[ ]'$ ; Melena'[ ]'$ ; Hematochezia '[ ]'$ ; Heartburn'[ ]'$ ; Abdominal pain '[ ]'$ ; Constipation '[ ]'$ ; Diarrhea '[ ]'$ ; BRBPR '[ ]'$   . GU: Hematuria'[ ]'$ ; Dysuria '[ ]'$ ; Nocturia'[ ]'$   . Vascular: Pain in legs with walking '[ ]'$ ; Pain in feet with lying flat '[ ]'$ ; Non-healing sores '[ ]'$ ; Stroke '[ ]'$ ; TIA '[ ]'$ ; Slurred speech '[ ]'$ ;  . Neuro: Headaches'[ ]'$ ; Vertigo'[ ]'$ ; Seizures'[ ]'$ ; Paresthesias'[ ]'$ ;Blurred vision '[ ]'$ ; Diplopia '[ ]'$ ; Vision changes '[ ]'$   . Ortho/Skin: Arthritis '[ ]'$ ; Joint pain [ Y]; Muscle pain '[ ]'$ ; Joint swelling '[ ]'$ ; Back Pain '[ ]'$ ; Rash '[ ]'$   . Psych: Depression[Y ]; Anxiety'[ ]'$   . Heme: Bleeding problems '[ ]'$ ; Clotting disorders '[ ]'$ ; Anemia '[ ]'$   . Endocrine: Diabetes '[ ]'$ ; Thyroid dysfunction'[ ]'$   Home Medications Prior to Admission medications   Medication Sig Start Date  End Date Taking? Authorizing Provider  acyclovir (ZOVIRAX) 400 MG tablet Take 1 tablet (400 mg total) by mouth 2 (two) times daily. 08/15/19  Yes Brunetta Genera, MD  amLODipine (NORVASC) 5 MG tablet Take 1 tablet (5 mg total) by mouth daily. 05/16/19 05/10/20 Yes Eileen Stanford, PA-C  amoxicillin (AMOXIL) 500 MG capsule Take 2,000 mg by mouth once.    Yes [provider]  Carboxymethylcellul-Glycerin (LUBRICATING EYE DROPS OP) Place 1 drop into both eyes daily as needed (dry eyes).   Yes [provider]  Cholecalciferol (VITAMIN D) 50 MCG (2000 UT) tablet Take 2,000 Units by mouth daily.   Yes [provider]  dexamethasone (DECADRON) 4 MG tablet Take 3 tablets (12 mg total) by mouth once a week. Patient taking differently: Take 12 mg by mouth every Monday.  06/12/19  Yes Brunetta Genera, MD  diclofenac Sodium (VOLTAREN) 1 % GEL Apply 1 application topically 4 (four) times daily as needed (pain).   Yes [provider]  DULoxetine (CYMBALTA) 60 MG capsule Take 1 capsule (60 mg total) by mouth daily. 08/15/19  Yes Kale, Cloria Spring, MD  ELIQUIS 5 MG TABS tablet TAKE 1 TABLET BY MOUTH TWICE A DAY Patient taking differently: Take 5 mg by mouth 2 (two) times daily.  07/09/19  Yes Larey Dresser, MD  ergocalciferol (VITAMIN D2) 1.25 MG (50000 UT) capsule Take 1 capsule (50,000 Units total) by mouth once a week. 07/03/19  Yes Brunetta Genera, MD  famotidine (PEPCID) 40 MG tablet Take 1 tablet (40 mg total) by mouth daily. 01/29/19  Yes [name], [name], MD  fentaNYL (DURAGESIC) 12 MCG/HR Place 1 patch onto the skin every 3 (three) days. 08/15/19  Yes Brunetta Genera, MD  furosemide (LASIX) 20 MG tablet Take '40mg'$  (2 tabs) daily alternating with '20mg'$  (1 tab) daily every other day Patient taking differently: Take 20 mg by mouth daily. Take '40mg'$  (2 tabs) daily alternating with '20mg'$  (1 tab) daily every other day 08/28/19  Yes Larey Dresser, MD  gabapentin (NEURONTIN) 300 MG capsule Take 1 in the morning and 2 tablet at bedtime. Patient taking differently: Take 100-200 mg by mouth See admin instructions. Take 100 mg  in the morning and 200 mg at bedtime. 09/10/19  Yes Patel, [name], DO  loratadine (CLARITIN) 10 MG tablet Take 1 tablet (10 mg total) by mouth daily. 12/11/18  Yes [name], [name], MD  LORazepam (ATIVAN) 0.5 MG tablet Take 1 tablet (0.5 mg total) by mouth every 6 (six) hours as needed (Nausea or vomiting). 05/17/19  Yes Brunetta Genera, MD  metoprolol succinate (TOPROL-XL) 25 MG 24 hr tablet Take 1 tablet (25 mg total) by mouth 2 (two) times daily.  06/22/19  Yes Larey Dresser, MD  ondansetron (ZOFRAN) 8 MG tablet Take 1 tablet (8 mg total) by mouth 2 (two) times daily as needed (Nausea or vomiting). 05/17/19  Yes Brunetta Genera, MD  oxyCODONE (OXY IR/ROXICODONE) 5 MG immediate release tablet Take 1-2 tablets (5-10 mg total) by mouth every 4 (four) hours as needed for moderate pain or severe pain. 08/15/19  Yes Brunetta Genera, MD  potassium chloride SA (KLOR-CON M20) 20 MEQ tablet Take 2 tablets (40 mEq total) by mouth 2 (two) times daily. 08/15/19  Yes Brunetta Genera, MD  prochlorperazine (COMPAZINE) 10 MG tablet Take 1 tablet (10 mg total) by mouth every 6 (six) hours as needed (Nausea or vomiting). 05/17/19  Yes Irene Limbo,  Cloria Spring, MD  senna-docusate (SENNA S) 8.6-50 MG tablet Take 2 tablets by mouth at bedtime. Patient taking differently: Take 2 tablets by mouth at bedtime as needed for mild constipation.  06/19/19  Yes Brunetta Genera, MD  spironolactone (ALDACTONE) 50 MG tablet Take 1 tablet (50 mg total) by mouth daily. 08/16/19 11/14/19 Yes Larey Dresser, MD  lenalidomide (REVLIMID) 15 MG capsule Take 1 capsule (15 mg total) by mouth daily. Take for 14 days on, 7 days off, repeat every 21 days. Patient taking differently: Take 15 mg by mouth See admin instructions. Take 15 mg daily for 14 days on, 7 days off, repeat every 21 days. 07/05/19   Brunetta Genera, MD    Past Medical History: Past Medical History:  Diagnosis Date  . Ascending aortic aneurysm ([place])   . Bicuspid aortic valve   . Cancer ([place])   . CHF NYHA class I (no symptoms from ordinary activities), acute, diastolic ([place])   . Fatty liver    mild  . GI bleeding 07/21/2018   post polypectomy  . Hemorrhoids   . HTN (hypertension)   . Hypercholesteremia   . Hypokalemia   . Internal hemorrhoids   . LBP (low back pain)   . Moderate aortic stenosis   . Osteoarthritis   . Paroxysmal atrial fibrillation ([place])    a. new onset Afib in 07/2008. He  underwent ibutilide cardioversion successfully. b. Recurrence 01/2013 s/p TEE/DCCV - was on Xarelto but he stopped it as he was convinced it was causing joint pn. c. Recurrence 01/2016 - spont conv to NSR. Pt took Eliquis x1 mo then declined further anticoag. d. Recurrence 07/2016.  [name] Pneumonia   . Tubular adenoma of colon     Past Surgical History: Past Surgical History:  Procedure Laterality Date  . BACK SURGERY  x12 years ago  . CARDIOVERSION N/A 01/26/2013   Procedure: CARDIOVERSION;  Surgeon: Larey Dresser, MD;  Location: [facility] ENDOSCOPY;  Service: Cardiovascular;  Laterality: N/A;  . CARDIOVERSION N/A 10/28/2017   Procedure: CARDIOVERSION;  Surgeon: Larey Dresser, MD;  Location: [facility] ENDOSCOPY;  Service: Cardiovascular;  Laterality: N/A;  . CARDIOVERSION N/A 03/03/2018   Procedure: CARDIOVERSION;  Surgeon: Lelon Perla, MD;  Location: [facility] ENDOSCOPY;  Service: Cardiovascular;  Laterality: N/A;  . COLONOSCOPY    . COLONOSCOPY  07/17/2018   at [facility]  . HEMORRHOID SURGERY    . LUMBAR LAMINECTOMY    . POLYPECTOMY    . RIGHT HEART CATH N/A 08/20/2019   Procedure: RIGHT HEART CATH;  Surgeon: Larey Dresser, MD;  Location: [place] CV LAB;  Service: Cardiovascular;  Laterality: N/A;  . RIGHT/LEFT HEART CATH AND CORONARY ANGIOGRAPHY N/A 03/07/2019   Procedure: RIGHT/LEFT HEART CATH AND CORONARY ANGIOGRAPHY;  Surgeon: Burnell Blanks, MD;  Location: [place] CV LAB;  Service: Cardiovascular;  Laterality: N/A;  . TEE WITHOUT CARDIOVERSION N/A 01/26/2013   Procedure: TRANSESOPHAGEAL ECHOCARDIOGRAM (TEE);  Surgeon: Larey Dresser, MD;  Location: [place];  Service: Cardiovascular;  Laterality: N/A;  . TEE WITHOUT CARDIOVERSION N/A 10/28/2017   Procedure: TRANSESOPHAGEAL ECHOCARDIOGRAM (TEE);  Surgeon: Larey Dresser, MD;  Location: [facility] ENDOSCOPY;  Service: Cardiovascular;  Laterality: N/A;  . TEE WITHOUT CARDIOVERSION N/A 05/08/2019   Procedure: TRANSESOPHAGEAL ECHOCARDIOGRAM  (TEE);  Surgeon: Burnell Blanks, MD;  Location: [place] CV LAB;  Service: Open Heart Surgery;  Laterality: N/A;  . TRANSCATHETER AORTIC VALVE REPLACEMENT, TRANSFEMORAL N/A 05/08/2019   Procedure: TRANSCATHETER AORTIC VALVE REPLACEMENT,  TRANSFEMORAL;  Surgeon: Burnell Blanks, MD;  Location: [place] CV LAB;  Service: Open Heart Surgery;  Laterality: N/A;    Family History: Family History  Problem Relation Age of Onset  . Colon cancer Mother 74  . Hypertension Other   . Coronary artery disease Neg Hx   . Colon polyps Neg Hx   . Esophageal cancer Neg Hx   . Rectal cancer Neg Hx   . Stomach cancer Neg Hx     Social History: Social History   Socioeconomic History  . Marital status: Married    Spouse name: Not on file  . Number of children: 0  . Years of education: Not on file  . Highest education level: Not on file  Occupational History  . Occupation: Retired Lobbyist: [employer]  Tobacco Use  . Smoking status: Never Smoker  . Smokeless tobacco: Never Used  Substance and Sexual Activity  . Alcohol use: Yes    Comment: Drinks 1 glass of wine nightly/socially  . Drug use: No  . Sexual activity: Yes  Other Topics Concern  . Not on file  Social History Narrative   Patient lives in [place] w/ his wife. He is a native of [place]. 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   Right-handed   Caffeine: 2 cups coffee per day   Two story home   Social Determinants of Health   Financial Resource Strain:   . Difficulty of Paying Living Expenses: Not on file  Food Insecurity:   . Worried About Charity fundraiser in the Last Year: Not on file  . Ran Out of Food in the Last Year: Not on file  Transportation Needs:   . Lack of Transportation (Medical): Not on file  . Lack of Transportation (Non-Medical): Not on file  Physical Activity:   . Days of Exercise per Week: Not on file  . Minutes of Exercise  per Session: Not on file  Stress:   . Feeling of Stress : Not on file  Social Connections:   . Frequency of Communication with Friends and Family: Not on file  . Frequency of Social Gatherings with Friends and Family: Not on file  . Attends Religious Services: Not on file  . Active Member of Clubs or Organizations: Not on file  . Attends Archivist Meetings: Not on file  . Marital Status: Not on file    Allergies:  Allergies  Allergen Reactions  . Xarelto [Rivaroxaban] Other (See Comments) and Hypertension    INCREASED BP-HYPERTENSIVE EVENTS  . Corticosteroids Other (See Comments)    Made the patient  "sick," feel "weird," and his "body rejected" them   . Ramipril Other (See Comments)    Could not eat or sleep, lost muscle mass  . Benazepril Cough    Objective:    Vital Signs:   Temp:  [98.5 F (36.9 C)] 98.5 F (36.9 C) (01/04 0454) Pulse Rate:  [79-146] 119 (01/04 0454) Resp:  [18] 18 (01/04 0454) BP: (90-121)/(62-95) 121/82 (01/04 0454) SpO2:  [87 %-100 %] 93 % (01/04 0454) Weight:  [73.3 kg] 73.3 kg (01/04 0900) Last BM Date: 09/16/19  Weight change: Filed Weights   09/14/19 1533 09/15/19 0523 09/17/19 0900  Weight: 72.4 kg 75.7 kg 73.3 kg    Intake/Output:   Intake/Output Summary (Last 24 hours) at 09/17/2019 0947 Last data filed at 09/17/2019 0900 Gross per 24 hour  Intake  2597.44 ml  Output 1254 ml  Net 1343.44 ml      Physical Exam    General:  Sedated.  No resp difficulty HEENT: normal Neck: supple. JVP 5-6 . Carotids 2+ bilat; no bruits. No lymphadenopathy or thyromegaly appreciated. Cor: PMI nondisplaced. Irregular rate & rhythm. No rubs, gallops or murmurs. Lungs: clear Abdomen: soft, nontender, nondistended. No hepatosplenomegaly. No bruits or masses. Good bowel sounds. Extremities: no cyanosis, clubbing, rash, edema Neuro: Sedated  Telemetry   A fib RVR 120s personally reviewed.   EKG    Afib 151 personally reviewed.   Labs    Basic Metabolic Panel: Recent Labs  Lab 09/13/19 2325 09/15/19 0420 09/16/19 0845  NA 140 139 137  K 3.7 4.8 3.9  CL 102 109 109  CO2 19* 18* 19*  GLUCOSE 192* 110* 91  BUN 44* 41* 19  CREATININE 2.02* 1.53* 0.91  CALCIUM 10.5* 8.4* 7.8*    Liver Function Tests: Recent Labs  Lab 09/13/19 2325 09/16/19 0845  AST 39 30  ALT 29 25  ALKPHOS 126 95  BILITOT 1.6* 0.7  PROT 7.4 4.3*  ALBUMIN 5.2* 2.7*   No results for input(s): LIPASE, AMYLASE in the last 168 hours. No results for input(s): AMMONIA in the last 168 hours.  CBC: Recent Labs  Lab 09/13/19 2325 09/14/19 2135 09/15/19 0420 09/15/19 1109 09/16/19 0845 09/17/19 0704  WBC 6.1 12.2* 10.0 10.2 7.5 7.8  NEUTROABS 5.1  --   --   --   --   --   HGB 16.9 14.5 14.0 13.1 11.7* 11.8*  HCT 50.8 42.7 42.7 39.1 35.3* 35.3*  MCV 91.4 89.5 92.6 90.3 92.4 89.8  PLT 173 93* 82* 87* 80* 86*    Cardiac Enzymes: No results for input(s): CKTOTAL, CKMB, CKMBINDEX, TROPONINI in the last 168 hours.  BNP: BNP (last 3 results) Recent Labs    05/04/19 1620 06/11/19 1235 07/30/19 1028  BNP 1,279.6* 508.3* 126.5*    ProBNP (last 3 results) No results for input(s): PROBNP in the last 8760 hours.   CBG: Recent Labs  Lab 09/15/19 2121 09/16/19 0530 09/16/19 1211 09/16/19 1642 09/17/19 0619  GLUCAP 77 72 88 77 88    Coagulation Studies: Recent Labs    09/14/19 1027  LABPROT 17.6*  INR 1.5*     Imaging    No results found.   Medications:     Current Medications: . acyclovir  400 mg Oral BID  . apixaban  5 mg Oral BID  . dexamethasone  12 mg Oral Q Mon  . DULoxetine  60 mg Oral Daily  . fentaNYL  1 patch Transdermal Q72H  . gabapentin  100 mg Oral q morning - 10a   And  . gabapentin  200 mg Oral QHS  . sodium chloride flush  3 mL Intravenous Q12H     Infusions: . amiodarone 30 mg/hr (09/17/19 0504)  . ceFEPime (MAXIPIME) IV 2 g (09/17/19 0909)  . lactated ringers Stopped (09/17/19  0500)  . sodium chloride        Assessment/Plan   1. Shock  ? Source. Possible ischemic bowel.  -Lactic Acidosis on admit 6.1--> 1.5  -Pro calcitonin 48 on admit  -WBC peaked at 15.8 but today  Down to 7.8  - On cefepime  - No blood cultures ordered - Urine culture - no growth   2. AKI  Creatinine on admit 2.1. Creatinine down to 0.89.   3. A fib RVR H/O PAF -  On  amiodarone drip.  - Eliquis was ordered 11/3 but never received due to rectal bleeding.  - He refused eliquis today . Discussed with Dr Haroldine Laws. Start heparin drip.  - Will need TEE-DC-CV once he improves.   4. Chronic Diastolic HF  -ECHO 11/74 EF >65%  - Volume status stable.  -No diuretics for now.   5 . Bicuspid aortic valve disorder:Ascending aorta was 4.3 cm on recent coronary CTA. He is s/p TAVR with 2 [place] THVs (valve-in-valve due to peri-valvular leak after 1st valve placed). Post-op, mean gradient across the aortic valve has been elevated, most recently 26 mmHg in 9/20.  Dimensionless index, most recently 0.54, only suggests mild bioprosthetic aortic stenosis.   6. Multiple Myeloma - Followed by Dr Irene Limbo. CT abd with multiple pelvic lesions.   6 . Hypokalemia -Supp K  7. DNR  Length of Stay: 3  Amy Clegg, NP  09/17/2019, 9:47 AM  Advanced Heart Failure Team Pager [phone] (M-F; 7a - 4p)  Please contact [facility] for night-coverage after hours (4p -7a ) and weekends on [email or web address] \ Patient seen and examined with the above-signed Advanced Practice Provider and/or Housestaff. I personally reviewed laboratory data, imaging studies and relevant notes. I independently examined the patient and formulated the important aspects of the plan. I have edited the note to reflect any of my changes or salient points. I have personally discussed the plan with the patient and/or family.  Very difficult situation. 79 y/o with advanced multiple myeloma, PAF, chronic diastolic HF and AS s/p VIV TAVR.     Recently admitted with shock and probable ischemic colitis in setting of recurrent AF with RVR. Now on amio gtt. Shock seems to have stabilized with improving renal function and resolution of lactic acidosis. Now struggling with altered mental status and sundowning and refusing meds. No further rectal bleeding in last 24 hours. Have attempted to restart Eliquis but refusing oral meds.   On exam General:  Weak appearing. LethargicNo resp difficulty HEENT: normal Neck: supple. no JVD. Carotids 2+ bilat; no bruits. No lymphadenopathy or thryomegaly appreciated. Cor: PMI nondisplaced. irregular rate & rhythm. Tachy  SEM at LUSB Lungs: clear Abdomen: soft, nontender, nondistended. No hepatosplenomegaly. No bruits or masses. Good bowel sounds. Extremities: no cyanosis, clubbing, rash, edema Neuro: lethargic otherwise non-focal   As in the past, he clearly has not tolerated AF well. Suspect he had low output shock and ischemic colitis on admit. Now stabilized. Agree with continuing IV amio and plan for restoration of NSR. As he is not taking po meds with use IV heparin over Eliquis. If no bleeding for next 24-48 hours can likely proceed with TEE/DC-CV on Wednesday. Would repeat echo to make sure EF stable. Volume status stable. Agree with holding diuretics for now.   Glori Bickers, MD  5:57 PM

## 2019-09-17 NOTE — Plan of Care (Signed)
  Problem: Activity: Goal: Risk for activity intolerance will decrease Outcome: Progressing   

## 2019-09-18 ENCOUNTER — Inpatient Hospital Stay (HOSPITAL_COMMUNITY): Payer: Medicare Other

## 2019-09-18 DIAGNOSIS — K559 Vascular disorder of intestine, unspecified: Secondary | ICD-10-CM

## 2019-09-18 LAB — BASIC METABOLIC PANEL
Anion gap: 15 (ref 5–15)
BUN: 11 mg/dL (ref 8–23)
CO2: 17 mmol/L — ABNORMAL LOW (ref 22–32)
Calcium: 7.9 mg/dL — ABNORMAL LOW (ref 8.9–10.3)
Chloride: 105 mmol/L (ref 98–111)
Creatinine, Ser: 0.96 mg/dL (ref 0.61–1.24)
GFR calc Af Amer: >60 mL/min (ref >60)
GFR calc non Af Amer: >60 mL/min (ref >60)
Glucose, Bld: 131 mg/dL — ABNORMAL HIGH (ref 70–99)
Potassium: 3.6 mmol/L (ref 3.5–5.1)
Sodium: 137 mmol/L (ref 135–145)

## 2019-09-18 LAB — CBC
HCT: 36.6 % — ABNORMAL LOW (ref 39.0–52.0)
Hemoglobin: 12.3 g/dL — ABNORMAL LOW (ref 13.0–17.0)
MCH: 30.3 pg (ref 26.0–34.0)
MCHC: 33.6 g/dL (ref 30.0–36.0)
MCV: 90.1 fL (ref 80.0–100.0)
Platelets: 106 10*3/uL — ABNORMAL LOW (ref 150–400)
RBC: 4.06 MIL/uL — ABNORMAL LOW (ref 4.22–5.81)
RDW: 12.8 % (ref 11.5–15.5)
WBC: 6.9 10*3/uL (ref 4.0–10.5)
nRBC: 0 % (ref 0.0–0.2)

## 2019-09-18 LAB — GLUCOSE, CAPILLARY
Glucose-Capillary: 97 mg/dL (ref 70–99)
Glucose-Capillary: 105 mg/dL — ABNORMAL HIGH (ref 70–99)
Glucose-Capillary: 100 mg/dL — ABNORMAL HIGH (ref 70–99)
Glucose-Capillary: 101 mg/dL — ABNORMAL HIGH (ref 70–99)

## 2019-09-18 MED ORDER — APIXABAN 5 MG PO TABS
5.0000 mg | ORAL_TABLET | Freq: Two times a day (BID) | ORAL | Status: DC
  Administered 2019-09-18 – 2019-09-21 (×7): 5 mg via ORAL
  Filled 2019-09-18 (×7): qty 1

## 2019-09-18 NOTE — Progress Notes (Signed)
OT Cancellation Note  Patient Details Name: [name] [name] MRN: [id number] DOB: Dec 14, 1940   Cancelled Treatment:    Reason Eval/Treat Not Completed: Patient not medically ready RN requesting to hold OT/PT session at this time due to a-fib, HR ranging from 112-146. RN reported HR up to 170 with transfer to recliner. OT will return later as time allows and pt is appropriate.   Dorinda Hill OTR/L Acute Rehabilitation Services Office: [phone]   Wyn Forster 09/18/2019, 12:11 PM

## 2019-09-18 NOTE — Progress Notes (Signed)
Occupational Therapy Evaluation Patient Details Name: [name] [name] MRN: [id number] DOB: Apr 22, 1941 Today's Date: 09/18/2019    History of Present Illness [name] [name] is a 79 y.o. male with medical history significant of afib on Eliquis; HTN; HLD; chronic diastolic CHF; s/p TAVR; and multiple myeloma with metastatic disease. Patient presented secondary to abdominal pain with concern for sepsis on admission.   Clinical Impression   RN approved light activity at this time to evaluate pt. PTA, pt was living at home with his wife, and was independent with ADL/IADL and functional mobility. Pt reports he was walking 75mles and was driving.  Pt currently limited by a-fib, HR ranging from 115bpm - 146bpm. Session limited to bed level, therapist assisted repositioning pt higher in bed, as his feet where pressed against footboard. Due to decline in current level of function, pt would benefit from acute OT to address established goals to facilitate safe D/C to venue listed below. At this time, recommend HHOT follow-up. Plan for cardioversion 1/6. Will continue to follow acutely.     Follow Up Recommendations  Home health OT;Supervision - Intermittent    Equipment Recommendations  3 in 1 bedside commode    Recommendations for Other Services       Precautions / Restrictions Precautions Precautions: Fall Precaution Comments: watch HR Restrictions Weight Bearing Restrictions: No      Mobility Bed Mobility Overal bed mobility: Needs Assistance Bed Mobility: Rolling Rolling: Min guard            Transfers                 General transfer comment: deferred due to HR    Balance                                           ADL either performed or assessed with clinical judgement   ADL Overall ADL's : Needs assistance/impaired Eating/Feeding: Set up;Sitting   Grooming: Set up;Sitting   Upper Body Bathing: Minimal assistance;Sitting   Lower Body Bathing:  Moderate assistance;Sitting/lateral leans   Upper Body Dressing : Moderate assistance   Lower Body Dressing: Moderate assistance;Sitting/lateral leans                 General ADL Comments: deferred functional mobiltiy and transfers due to a-fib HR ranging 115bpm-146bpm;bed level session completed     Vision Baseline Vision/History: Wears glasses Wears Glasses: Distance only Patient Visual Report: Other (comment)(reports intermittent "light flying by") Vision Assessment?: No apparent visual deficits     Perception     Praxis      Pertinent Vitals/Pain Pain Assessment: Faces Faces Pain Scale: Hurts little more Pain Location: "everywhere" Pain Descriptors / Indicators: Aching;Nagging;Discomfort Pain Intervention(s): Limited activity within patient's tolerance;Monitored during session     Hand Dominance Right   Extremity/Trunk Assessment Upper Extremity Assessment Upper Extremity Assessment: Generalized weakness;LUE deficits/detail LUE Deficits / Details: LUE (3+/5) grossly weaker than RUE (4/5) LUE Sensation: WNL   Lower Extremity Assessment Lower Extremity Assessment: Defer to PT evaluation       Communication Communication Communication: Other (comment)(at times difficult to understand due to accent)   Cognition Arousal/Alertness: Awake/alert Behavior During Therapy: WFL for tasks assessed/performed Overall Cognitive Status: Within Functional Limits for tasks assessed  General Comments: emotional lability when discussing stay in the hospital, pt stated he was "on his deathbed"   General Comments  pt's wife present during session;    Exercises     Shoulder Instructions      Home Living Family/patient expects to be discharged to:: Private residence Living Arrangements: Spouse/significant other Available Help at Discharge: Family Type of Home: House       Home Layout: Two level Alternate Level  Stairs-Number of Steps: flight Alternate Level Stairs-Rails: Right           Home Equipment: Cane - single point          Prior Functioning/Environment Level of Independence: Independent with assistive device(s)        Comments: wife assisting some with supervision on steps, etc;pt was walking 33mles and was driving        OT Problem List: Decreased activity tolerance;Impaired balance (sitting and/or standing);Decreased safety awareness;Cardiopulmonary status limiting activity;Pain      OT Treatment/Interventions: Energy conservation;Therapeutic exercise;Self-care/ADL training;DME and/or AE instruction;Therapeutic activities;Patient/family education;Balance training    OT Goals(Current goals can be found in the care plan section) Acute Rehab OT Goals Patient Stated Goal: to get back to independent level OT Goal Formulation: With patient/family Time For Goal Achievement: 10/02/19 Potential to Achieve Goals: Good ADL Goals Pt Will Perform Grooming: with supervision;standing Pt Will Perform Upper Body Dressing: with supervision;standing;sitting Pt Will Perform Lower Body Dressing: with supervision;sit to/from stand Pt Will Transfer to Toilet: with supervision;ambulating  OT Frequency: Min 2X/week   Barriers to D/C:            Co-evaluation              AM-PAC OT "6 Clicks" Daily Activity     Outcome Measure Help from another person eating meals?: A Little Help from another person taking care of personal grooming?: A Little Help from another person toileting, which includes using toliet, bedpan, or urinal?: A Little Help from another person bathing (including washing, rinsing, drying)?: A Lot Help from another person to put on and taking off regular upper body clothing?: A Little Help from another person to put on and taking off regular lower body clothing?: A Lot 6 Click Score: 16   End of Session Nurse Communication: Mobility status  Activity Tolerance:  Patient limited by fatigue;Treatment limited secondary to medical complications (Comment)(A-fib) Patient left: in bed;with call bell/phone within reach;with bed alarm set  OT Visit Diagnosis: Other abnormalities of gait and mobility (R26.89);Muscle weakness (generalized) (M62.81);Pain Pain - part of body: (generalized)                Time: 14496-7591OT Time Calculation (min): 19 min Charges:  OT General Charges $OT Visit: 1 Visit OT Evaluation $OT Eval Moderate Complexity: [place] Acute Rehabilitation Services Office: 3[place]1/01/2020, 4:04 PM

## 2019-09-18 NOTE — Care Management Important Message (Signed)
Important Message  Patient Details  Name: [name] [name] MRN: [id number] Date of Birth: [birth year]   Medicare Important Message Given:  Yes     Shelda Altes 09/18/2019, 12:49 PM

## 2019-09-18 NOTE — Progress Notes (Signed)
PROGRESS NOTE    [name] [name]  TDH:[id number] DOB: 1941-01-12 DOA: 09/13/2019 PCP: Cassandria Anger, MD     Brief Narrative:  [name] [name] is a 79 y.o. male with medical history significant of Paroxysmal A fib on Eliquis; HTN; HLD; chronic diastolic CHF; s/p TAVR in Aug 2020; and multiple myeloma with metastatic disease followed by Dr. Irene Limbo presenting with abdominal pain. Wife reports that he was diagnosed with bone cancer in August.  They found it while undergoing work-up for TAVR.  He was diagnosed with multiple myeloma, was undergoing infusions, none in 2-3 weeks.  He has lost a lot of weight.  Last night, he was vomiting profusely, cold/sweaty, hard of breathing.  He started acutely last night, and developed diarrhea about a 30 minutes later.  No fevers.  He was admitted for severe sepsis, thought to be abdominal etiology as he presented with bloody diarrhea as well as altered mental status.  C. difficile was negative.  1/3: Overnight, patient went into A. fib RVR with relative hypotension.  His heart rate is ranging 140-160s, irregular in rhythm.  His blood pressure also has been relatively low, most recent SBP 94.  Amiodarone gtt started and cardiology consulted 1/4: Overnight, patient had significant agitation, combativeness. Wife allowed at bedside after visiting hours due to sundowning.   New events last 24 hours / Subjective: Doing better this morning. Wife at bedside. Patient has no further abdominal pain or bloody stools. Continues to have significant neuropathic leg pain post-chemo.   Assessment & Plan:   Principal Problem:   Abdominal pain, diffuse Active Problems:   Essential hypertension   PAF (paroxysmal atrial fibrillation) (HCC)   Atrial fibrillation with RVR (HCC)   Acute kidney injury superimposed on CKD (HCC)   S/P TAVR (transcatheter aortic valve replacement)   Multiple myeloma not having achieved remission (HCC)   Bone metastases (HCC)   Peripheral  neuropathy   Bloody diarrhea   Acute metabolic encephalopathy   Goals of care, counseling/discussion   DNR (do not resuscitate)   Pressure injury of skin   Severe sepsis secondary to ischemic colitis as well as altered mental status on admission, sepsis present on admission -C. difficile negative, GI PCR negative -Started on empiric vancomycin and cefepime --> continue cefepime -Improving   Acute metabolic encephalopathy -Patient was noted to be obtunded at the time of admission -Has intermittent episodes of combativeness and confusion.  Likely due to sundowning.  Seems to be much more calm on my examination this morning.  A. fib RVR -History of paroxysmal atrial fibrillation, followed by Dr. Aundra Dubin.  Pt has required cardioversion in the past -IV heparin --> Eliquis -Due to hypotension, stop metoprolol.  Started on amiodarone drip -Cardiology following, planning for TEE-guided DCCV 1/6   AKI -Baseline creatinine 0.87  -Cr peaked at 2.02 and now AKI resolved after IVF  Multiple myeloma, plasma cell myeloma -Patient follows with Dr. Irene Limbo. Has associated history of hypercalcemia, currently on Zometa. Oncology curbsided on admission with recommendations for supportive care. On Acyclovir and weekly Decadron, Revlimid   Peripheral neuropathy appears to be secondary to chemotherapy -Continue home Neurontin (watch for renal function), Cymbalta and oxycodone -Palliative care consulted for symptom management   Chronic diastolic heart failure -No acute exacerbation at this time -Home Lasix and spironolactone held due to AKI on admission  Bicuspid aortic valve disorder -Status post TAVR August 2020  Dysuria -Urinalysis does not suggest infection. No known history of BPH. Urine culture negative  In agreement with assessment of the pressure ulcer as below:  Pressure Injury 09/14/19 Sacrum Anterior Stage 2 -  Partial thickness loss of dermis presenting as a shallow open  injury with a red, pink wound bed without slough. (Active)  09/14/19 1621  Location: Sacrum  Location Orientation: Anterior  Staging: Stage 2 -  Partial thickness loss of dermis presenting as a shallow open injury with a red, pink wound bed without slough.  Wound Description (Comments):   Present on Admission: Yes         DVT prophylaxis: Eliquis Code Status: DNR Family Communication: Wife at bedside Disposition Plan: Planning for cardioversion 1/6    Consultants:   Oncology (curbside)  Cardiology  Procedures:   None  Antimicrobials:  Anti-infectives (From admission, onward)   Start     Dose/Rate Route Frequency Ordered Stop   09/16/19 1000  vancomycin (VANCOREADY) IVPB 1500 mg/300 mL  Status:  Discontinued     1,500 mg 150 mL/hr over 120 Minutes Intravenous Every 48 hours 09/14/19 0857 09/16/19 1011   09/14/19 2000  ceFEPIme (MAXIPIME) 2 g in sodium chloride 0.9 % 100 mL IVPB     2 g 200 mL/hr over 30 Minutes Intravenous Every 12 hours 09/14/19 0857     09/14/19 1000  acyclovir (ZOVIRAX) tablet 400 mg     400 mg Oral 2 times daily 09/14/19 0854     09/14/19 0630  vancomycin (VANCOREADY) IVPB 1500 mg/300 mL     1,500 mg 150 mL/hr over 120 Minutes Intravenous  Once 09/14/19 0624 09/14/19 1230   09/14/19 0630  ceFEPIme (MAXIPIME) 2 g in sodium chloride 0.9 % 100 mL IVPB     2 g 200 mL/hr over 30 Minutes Intravenous  Once 09/14/19 0624 09/14/19 0930       Objective: Vitals:   09/17/19 0957 09/17/19 1111 09/17/19 1952 09/18/19 0609  BP: 122/74 108/84 (!) 111/100 127/81  Pulse: (!) 105 (!) 112 82 (!) 124  Resp: _0 Temp: (!) 97.4 F (36.3 C)  98.5 F (36.9 C) 98.3 F (36.8 C)  TempSrc: Oral   Oral  SpO2: 97% 97% 100% 96%  Weight:    74.1 kg  Height:        Intake/Output Summary (Last 24 hours) at 09/18/2019 0911 Last data filed at 09/18/2019 0756 Gross per 24 hour  Intake 2614.36 ml  Output 600 ml  Net 2014.36 ml   Filed Weights   09/15/19  0523 09/17/19 0900 09/18/19 0609  Weight: 75.7 kg 73.3 kg 74.1 kg    Examination: General exam: Appears calm and comfortable  Respiratory system: Clear to auscultation. Respiratory effort normal. Cardiovascular system: S1 & S2 heard, Irreg rhythm, rate 130s. No pedal edema. Gastrointestinal system: Abdomen is nondistended, soft and nontender. Normal bowel sounds heard. Central nervous system: Alert. Non focal exam. Speech clear  Extremities: Symmetric in appearance bilaterally  Skin: No rashes, lesions or ulcers on exposed skin  Psychiatry: Stable    Data Reviewed: I have personally reviewed following labs and imaging studies  CBC: Recent Labs  Lab 09/13/19 2325 09/15/19 0420 09/15/19 1109 09/16/19 0845 09/17/19 0704 09/18/19 0345  WBC 6.1 10.0 10.2 7.5 7.8 6.9  NEUTROABS 5.1  --   --   --   --   --   HGB 16.9 14.0 13.1 11.7* 11.8* 12.3*  HCT 50.8 42.7 39.1 35.3* 35.3* 36.6*  MCV 91.4 92.6 90.3 92.4 89.8 90.1  PLT 173 82* 87* 80* 86* 106*  Basic Metabolic Panel: Recent Labs  Lab 09/13/19 2325 09/15/19 0420 09/16/19 0845 09/17/19 0704 09/18/19 0345  NA 140 139 137 138 137  K 3.7 4.8 3.9 3.2* 3.6  CL 102 109 109 108 105  CO2 19* 18* 19* 20* 17*  GLUCOSE 192* 110* 91 91 131*  BUN 44* 41* _0 CREATININE 2.02* 1.53* 0.91 0.89 0.96  CALCIUM 10.5* 8.4* 7.8* 7.9* 7.9*   GFR: Estimated Creatinine Clearance: 56.3 mL/min (by C-G formula based on SCr of 0.96 mg/dL). Liver Function Tests: Recent Labs  Lab 09/13/19 2325 09/16/19 0845  AST 39 30  ALT 29 25  ALKPHOS 126 95  BILITOT 1.6* 0.7  PROT 7.4 4.3*  ALBUMIN 5.2* 2.7*   No results for input(s): LIPASE, AMYLASE in the last 168 hours. No results for input(s): AMMONIA in the last 168 hours. Coagulation Profile: Recent Labs  Lab 09/14/19 1027  INR 1.5*   Cardiac Enzymes: No results for input(s): CKTOTAL, CKMB, CKMBINDEX, TROPONINI in the last 168 hours. BNP (last 3 results) No results for input(s):  PROBNP in the last 8760 hours. HbA1C: No results for input(s): HGBA1C in the last 72 hours. CBG: Recent Labs  Lab 09/17/19 0619 09/17/19 1112 09/17/19 1637 09/17/19 2119 09/18/19 0605  GLUCAP 88 101* 84 90 97   Lipid Profile: No results for input(s): CHOL, HDL, LDLCALC, TRIG, CHOLHDL, LDLDIRECT in the last 72 hours. Thyroid Function Tests: No results for input(s): TSH, T4TOTAL, FREET4, T3FREE, THYROIDAB in the last 72 hours. Anemia Panel: No results for input(s): VITAMINB12, FOLATE, FERRITIN, TIBC, IRON, RETICCTPCT in the last 72 hours. Sepsis Labs: Recent Labs  Lab 09/13/19 2325 09/14/19 0609 09/14/19 1027 09/14/19 1615  PROCALCITON  --   --  48.04  --   LATICACIDVEN 6.1* 3.5* 1.7 1.5    Recent Results (from the past 240 hour(s))  GI pathogen panel by PCR, stool     Status: None   Collection Time: 09/14/19  1:00 AM   Specimen: Stool  Result Value Ref Range Status   Plesiomonas shigelloides NOT DETECTED NOT DETECTED Final   Yersinia enterocolitica NOT DETECTED NOT DETECTED Final   Vibrio NOT DETECTED NOT DETECTED Final   Enteropathogenic E coli NOT DETECTED NOT DETECTED Final   E coli (ETEC) LT/ST NOT DETECTED NOT DETECTED Final   E coli 3295 by PCR Not applicable NOT DETECTED Final   Cryptosporidium by PCR NOT DETECTED NOT DETECTED Final   Entamoeba histolytica NOT DETECTED NOT DETECTED Final   Adenovirus F 40/41 NOT DETECTED NOT DETECTED Final   Norovirus GI/GII NOT DETECTED NOT DETECTED Final   Sapovirus NOT DETECTED NOT DETECTED Final    Comment: (NOTE) Performed At: [facility] [place], [place] [id number] Rush Farmer MD [id number]    Vibrio cholerae NOT DETECTED NOT DETECTED Final   Campylobacter by PCR NOT DETECTED NOT DETECTED Final   Salmonella by PCR NOT DETECTED NOT DETECTED Final   E coli (STEC) NOT DETECTED NOT DETECTED Final   Enteroaggregative E coli NOT DETECTED NOT DETECTED Final   Shigella by PCR NOT DETECTED NOT  DETECTED Final   Cyclospora cayetanensis NOT DETECTED NOT DETECTED Final   Astrovirus NOT DETECTED NOT DETECTED Final   G lamblia by PCR NOT DETECTED NOT DETECTED Final   Rotavirus A by PCR NOT DETECTED NOT DETECTED Final  C difficile quick scan w PCR reflex     Status: None   Collection Time: 09/14/19  1:00 AM   Specimen:  Stool  Result Value Ref Range Status   C Diff antigen NEGATIVE NEGATIVE Final   C Diff toxin NEGATIVE NEGATIVE Final   C Diff interpretation No C. difficile detected.  Final    Comment: Performed at [facility], [place] [address], [place], [place] [address]  Respiratory Panel by RT PCR (Flu A&B, Covid) - Nasopharyngeal Swab     Status: None   Collection Time: 09/14/19  1:16 AM   Specimen: Nasopharyngeal Swab  Result Value Ref Range Status   SARS Coronavirus 2 by RT PCR NEGATIVE NEGATIVE Final    Comment: (NOTE) SARS-CoV-2 target nucleic acids are NOT DETECTED. The SARS-CoV-2 RNA is generally detectable in upper respiratoy specimens during the acute phase of infection. The lowest concentration of SARS-CoV-2 viral copies this assay can detect is 131 copies/mL. A negative result does not preclude SARS-Cov-2 infection and should not be used as the sole basis for treatment or other patient management decisions. A negative result may occur with  improper specimen collection/handling, submission of specimen other than nasopharyngeal swab, presence of viral mutation(s) within the areas targeted by this assay, and inadequate number of viral copies (<131 copies/mL). A negative result must be combined with clinical observations, patient history, and epidemiological information. The expected result is Negative. Fact Sheet for Patients:  PinkCheek.be Fact Sheet for Healthcare Providers:  GravelBags.it This test is not yet ap proved or cleared by the [place] FDA and  has been authorized for detection and/or  diagnosis of SARS-CoV-2 by FDA under an Emergency Use Authorization (EUA). This EUA will remain  in effect (meaning this test can be used) for the duration of the COVID-19 declaration under Section 564(b)(1) of the Act, 21 U.S.C. section 360bbb-3(b)(1), unless the authorization is terminated or revoked sooner.    Influenza A by PCR NEGATIVE NEGATIVE Final   Influenza B by PCR NEGATIVE NEGATIVE Final    Comment: (NOTE) The Xpert Xpress SARS-CoV-2/FLU/RSV assay is intended as an aid in  the diagnosis of influenza from Nasopharyngeal swab specimens and  should not be used as a sole basis for treatment. Nasal washings and  aspirates are unacceptable for Xpert Xpress SARS-CoV-2/FLU/RSV  testing. Fact Sheet for Patients: PinkCheek.be Fact Sheet for Healthcare Providers: GravelBags.it This test is not yet approved or cleared by the [place] FDA and  has been authorized for detection and/or diagnosis of SARS-CoV-2 by  FDA under an Emergency Use Authorization (EUA). This EUA will remain  in effect (meaning this test can be used) for the duration of the  Covid-19 declaration under Section 564(b)(1) of the Act, 21  U.S.C. section 360bbb-3(b)(1), unless the authorization is  terminated or revoked. Performed at [facility], [place] [address], [place], [place] [address]   Culture, Urine     Status: None   Collection Time: 09/15/19 11:36 AM   Specimen: Urine, Clean Catch  Result Value Ref Range Status   Specimen Description URINE, CLEAN CATCH  Final   Special Requests NONE  Final   Culture   Final    NO GROWTH Performed at [facility], [place] [address], [place],  [address]    Report Status 09/16/2019 FINAL  Final      Radiology Studies: No results found.    Scheduled Meds: . acyclovir  400 mg Oral BID  . apixaban  5 mg Oral BID  . dexamethasone  12 mg Oral Q Mon  . DULoxetine  60 mg Oral Daily  .  fentaNYL  1 patch Transdermal Q72H  . gabapentin  100 mg Oral q morning - 10a   And  . gabapentin  200 mg Oral QHS  . sodium chloride flush  3 mL Intravenous Q12H   Continuous Infusions: . amiodarone 30 mg/hr (09/18/19 0454)  . ceFEPime (MAXIPIME) IV 2 g (09/18/19 0810)  . lactated ringers Stopped (09/17/19 0500)  . sodium chloride       LOS: 4 days      Time spent: 25 minutes   Dessa Phi, DO [facility] 09/18/2019, 9:11 AM   Available via Epic secure chat 7am-7pm After these hours, please refer to coverage provider listed on [email or web address]

## 2019-09-18 NOTE — Plan of Care (Signed)
  Problem: Activity: Goal: Risk for activity intolerance will decrease Outcome: Progressing   Problem: Pain Managment: Goal: General experience of comfort will improve Outcome: Progressing   

## 2019-09-18 NOTE — Progress Notes (Addendum)
Patient ID: [name] [name], male   DOB: 02-08-1941, 79 y.o.   MRN: [id number]     Advanced Heart Failure Rounding Note  PCP-Cardiologist: No primary care provider on file.   Subjective:    Awake/alert this morning, seems less confused.  States that he will take Eliquis.  Currently on heparin gtt with no over bleeding.  He remains in atrial fibrillation.   Continues to report severe neuropathic pain in feet.    Objective:   Weight Range: 74.1 kg Body mass index is 26.37 kg/m.   Vital Signs:   Temp:  [97.4 F (36.3 C)-98.5 F (36.9 C)] 98.3 F (36.8 C) (01/05 0609) Pulse Rate:  [82-124] 124 (01/05 0609) Resp:  [18-20] 20 (01/05 0609) BP: (108-127)/(74-100) 127/81 (01/05 0609) SpO2:  [96 %-100 %] 96 % (01/05 0609) Weight:  [73.3 kg-74.1 kg] 74.1 kg (01/05 0609) Last BM Date: 09/17/19  Weight change: Filed Weights   09/15/19 0523 09/17/19 0900 09/18/19 0609  Weight: 75.7 kg 73.3 kg 74.1 kg    Intake/Output:   Intake/Output Summary (Last 24 hours) at 09/18/2019 0816 Last data filed at 09/18/2019 0756 Gross per 24 hour  Intake 2734.36 ml  Output 750 ml  Net 1984.36 ml      Physical Exam    General:  Well appearing. No resp difficulty HEENT: Normal Neck: Supple. JVP not elevated. Carotids 2+ bilat; no bruits. No lymphadenopathy or thyromegaly appreciated. Cor: PMI nondisplaced. Mildly tachy, irregular rate & rhythm. No rubs, gallops or murmurs. Lungs: Clear Abdomen: Soft, nontender, nondistended. No hepatosplenomegaly. No bruits or masses. Good bowel sounds. Extremities: No cyanosis, clubbing, rash, edema Neuro: Alert & orientedx3, cranial nerves grossly intact. moves all 4 extremities w/o difficulty. Affect pleasant   Telemetry   Atrial fibrillation with RVR last night, currently off (personally reviewed)  Labs    CBC Recent Labs    09/17/19 0704 09/18/19 0345  WBC 7.8 6.9  HGB 11.8* 12.3*  HCT 35.3* 36.6*  MCV 89.8 90.1  PLT 86* 371*   Basic Metabolic  Panel Recent Labs    09/17/19 0704 09/18/19 0345  NA 138 137  K 3.2* 3.6  CL 108 105  CO2 20* 17*  GLUCOSE 91 131*  BUN 11 11  CREATININE 0.89 0.96  CALCIUM 7.9* 7.9*   Liver Function Tests Recent Labs    09/16/19 0845  AST 30  ALT 25  ALKPHOS 95  BILITOT 0.7  PROT 4.3*  ALBUMIN 2.7*   No results for input(s): LIPASE, AMYLASE in the last 72 hours. Cardiac Enzymes No results for input(s): CKTOTAL, CKMB, CKMBINDEX, TROPONINI in the last 72 hours.  BNP: BNP (last 3 results) Recent Labs    05/04/19 1620 06/11/19 1235 07/30/19 1028  BNP 1,279.6* 508.3* 126.5*    ProBNP (last 3 results) No results for input(s): PROBNP in the last 8760 hours.   D-Dimer No results for input(s): DDIMER in the last 72 hours. Hemoglobin A1C No results for input(s): HGBA1C in the last 72 hours. Fasting Lipid Panel No results for input(s): CHOL, HDL, LDLCALC, TRIG, CHOLHDL, LDLDIRECT in the last 72 hours. Thyroid Function Tests No results for input(s): TSH, T4TOTAL, T3FREE, THYROIDAB in the last 72 hours.  Invalid input(s): FREET3  Other results:   Imaging     No results found.   Medications:     Scheduled Medications: . acyclovir  400 mg Oral BID  . apixaban  5 mg Oral BID  . dexamethasone  12 mg Oral Q Mon  .  DULoxetine  60 mg Oral Daily  . fentaNYL  1 patch Transdermal Q72H  . gabapentin  100 mg Oral q morning - 10a   And  . gabapentin  200 mg Oral QHS  . sodium chloride flush  3 mL Intravenous Q12H     Infusions: . amiodarone 30 mg/hr (09/18/19 0454)  . ceFEPime (MAXIPIME) IV 2 g (09/18/19 0810)  . lactated ringers Stopped (09/17/19 0500)  . sodium chloride       PRN Medications:  acetaminophen **OR** acetaminophen, diclofenac Sodium, fentaNYL (SUBLIMAZE) injection, haloperidol lactate, LORazepam, ondansetron **OR** ondansetron (ZOFRAN) IV, oxyCODONE, sodium chloride   Assessment/Plan   1. Shock: Resolved. Possible cardiogenic due to atrial  fibrillation with RVR and associated with ischemic colitis (abdominal symptoms, high lactate).  - Repeat echo.  2. GI: Abdominal pain with diarrhea at admission associated with elevated lactate, PCT 40.  Concern for ischemic colitis in setting of afib/RVR. Afebrile, remains on cefepime.  Abdominal exam benign today.  Stable hgb and no overt GI bleeding.  3. Atrial fibrillation: History PAF.  Has traditionally tolerated atrial fibrillation poorly.  He remains in afib today, rate is better controlled on amiodarone gtt. He is on heparin gtt.  - He is willing to take apixaban, start apixaban 5 mg po bid this morning.  - Continue amiodarone gtt.  - Plan TEE-guided DCCV after am dose of apixaban tomorrow.  Discussed risks/benefits with patient, he agrees to procedure.  4. Bicuspid aortic valve disorder: Ascending aorta was 4.3 cm on recent coronary CTA.He is s/p TAVR with 2 Edwards Sapien THVs (valve-in-valve due to peri-valvular leak after 1st valve placed). Post-op, mean gradient across the aortic valve has been elevated, most recently 26 mmHg in 9/20. Dimensionless index, most recently 0.54, only suggests mild bioprosthetic aortic stenosis.  5. AKI: Resolved.  6. Chronic diastolic CHF: Echo in 3/41 with EF >65%.  He is not volume overloaded on exam.  - Can hold Lasix.  - Repeat echo as above.  7. Foot pain: Severe neuropathic pain in feet, very distressing to him.   - I will ask that he be seen by palliative care service to help with management of this.   8. Multiple myeloma: Per Dr. Irene Limbo.  9. Neuro: Confusion seems significantly improved this morning.   Length of Stay: 4  [name], MD  09/18/2019, 8:16 AM  Advanced Heart Failure Team Pager [phone] (M-F; 7a - 4p)  Please contact [facility] for night-coverage after hours (4p -7a ) and weekends on [email or web address]

## 2019-09-18 NOTE — Progress Notes (Signed)
PT Cancellation Note  Patient Details Name: [name] [name] MRN: [id number] DOB: Aug 27, 1941   Cancelled Treatment:    Reason Eval/Treat Not Completed: Medical issues which prohibited therapy RN requesting to hold OT/PT session at this time due to a-fib, HR ranging from 112-146. RN reported HR up to 170 with transfer to recliner. PT will continue to follow acutely.   Earney Navy, PTA Acute Rehabilitation Services Pager: [phone] Office: [phone]   09/18/2019, 12:26 PM

## 2019-09-18 NOTE — Progress Notes (Signed)
Pts. Wife called to check on pt. Wife emotional during the call. Stated she stayed with pt. Last night and didn't get any rest. Wife stated she was told pt. Would have sitter tonight to help keep him safe and prevent him from falling. On call for [facility] paged to update on pts. Status and sitter request.

## 2019-09-18 NOTE — Progress Notes (Signed)
Pt. Will not keep heart monitor on. Second IV site discontinued by pt. Unable to infuse scheduled meds. RN will inform on call for [facility].

## 2019-09-19 ENCOUNTER — Encounter (HOSPITAL_COMMUNITY): Payer: Self-pay | Admitting: Internal Medicine

## 2019-09-19 ENCOUNTER — Inpatient Hospital Stay (HOSPITAL_COMMUNITY): Payer: Medicare Other | Admitting: Certified Registered"

## 2019-09-19 ENCOUNTER — Encounter (HOSPITAL_COMMUNITY)
Admission: EM | Disposition: A | Discharge status: Home Health | Payer: Self-pay | Source: Home / Self Care | Attending: Internal Medicine

## 2019-09-19 ENCOUNTER — Inpatient Hospital Stay (HOSPITAL_COMMUNITY): Payer: Medicare Other

## 2019-09-19 DIAGNOSIS — I5031 Acute diastolic (congestive) heart failure: Secondary | ICD-10-CM

## 2019-09-19 DIAGNOSIS — I4891 Unspecified atrial fibrillation: Secondary | ICD-10-CM

## 2019-09-19 DIAGNOSIS — I340 Nonrheumatic mitral (valve) insufficiency: Secondary | ICD-10-CM

## 2019-09-19 HISTORY — PX: CARDIOVERSION: SHX1299

## 2019-09-19 HISTORY — PX: TEE WITHOUT CARDIOVERSION: SHX5443

## 2019-09-19 LAB — BASIC METABOLIC PANEL
Anion gap: 12 (ref 5–15)
BUN: 5 mg/dL — ABNORMAL LOW (ref 8–23)
CO2: 19 mmol/L — ABNORMAL LOW (ref 22–32)
Calcium: 7.8 mg/dL — ABNORMAL LOW (ref 8.9–10.3)
Chloride: 109 mmol/L (ref 98–111)
Creatinine, Ser: 0.8 mg/dL (ref 0.61–1.24)
GFR calc Af Amer: >60 mL/min (ref >60)
GFR calc non Af Amer: >60 mL/min (ref >60)
Glucose, Bld: 100 mg/dL — ABNORMAL HIGH (ref 70–99)
Potassium: 3.1 mmol/L — ABNORMAL LOW (ref 3.5–5.1)
Sodium: 140 mmol/L (ref 135–145)

## 2019-09-19 LAB — GLUCOSE, CAPILLARY
Glucose-Capillary: 103 mg/dL — ABNORMAL HIGH (ref 70–99)
Glucose-Capillary: 107 mg/dL — ABNORMAL HIGH (ref 70–99)
Glucose-Capillary: 87 mg/dL (ref 70–99)
Glucose-Capillary: 103 mg/dL — ABNORMAL HIGH (ref 70–99)

## 2019-09-19 LAB — CBC
HCT: 37 % — ABNORMAL LOW (ref 39.0–52.0)
Hemoglobin: 12.8 g/dL — ABNORMAL LOW (ref 13.0–17.0)
MCH: 30.3 pg (ref 26.0–34.0)
MCHC: 34.6 g/dL (ref 30.0–36.0)
MCV: 87.5 fL (ref 80.0–100.0)
Platelets: 129 10*3/uL — ABNORMAL LOW (ref 150–400)
RBC: 4.23 MIL/uL (ref 4.22–5.81)
RDW: 12.9 % (ref 11.5–15.5)
WBC: 8.5 10*3/uL (ref 4.0–10.5)
nRBC: 0 % (ref 0.0–0.2)

## 2019-09-19 LAB — ECHOCARDIOGRAM LIMITED
Height: 66 in
Weight: 2652.57 oz

## 2019-09-19 LAB — PROTIME-INR
INR: 1.3 — ABNORMAL HIGH (ref 0.8–1.2)
Prothrombin Time: 15.8 seconds — ABNORMAL HIGH (ref 11.4–15.2)

## 2019-09-19 LAB — MAGNESIUM: Magnesium: 1.6 mg/dL — ABNORMAL LOW (ref 1.7–2.4)

## 2019-09-19 SURGERY — ECHOCARDIOGRAM, TRANSESOPHAGEAL
Anesthesia: Monitor Anesthesia Care

## 2019-09-19 MED ORDER — LIDOCAINE 2% (20 MG/ML) 5 ML SYRINGE
INTRAMUSCULAR | Status: DC | PRN
  Administered 2019-09-19: 40 mg via INTRAVENOUS

## 2019-09-19 MED ORDER — PROPOFOL 500 MG/50ML IV EMUL
INTRAVENOUS | Status: DC | PRN
  Administered 2019-09-19: 150 ug/kg/min via INTRAVENOUS

## 2019-09-19 MED ORDER — SODIUM CHLORIDE 0.9 % IV SOLN
2.0000 g | Freq: Three times a day (TID) | INTRAVENOUS | Status: DC
  Administered 2019-09-19 (×2): 2 g via INTRAVENOUS
  Filled 2019-09-19 (×4): qty 2

## 2019-09-19 MED ORDER — POTASSIUM CHLORIDE CRYS ER 20 MEQ PO TBCR
40.0000 meq | EXTENDED_RELEASE_TABLET | ORAL | Status: AC
  Administered 2019-09-19 (×2): 40 meq via ORAL
  Filled 2019-09-19 (×2): qty 2

## 2019-09-19 MED ORDER — PROPOFOL 10 MG/ML IV BOLUS
INTRAVENOUS | Status: DC | PRN
  Administered 2019-09-19: 30 mg via INTRAVENOUS

## 2019-09-19 MED ORDER — SODIUM CHLORIDE 0.9 % IV SOLN: INTRAVENOUS | Status: DC | PRN

## 2019-09-19 MED ORDER — SODIUM CHLORIDE 0.9 % IV SOLN: INTRAVENOUS | Status: DC

## 2019-09-19 MED ORDER — PROPOFOL 10 MG/ML IV BOLUS: INTRAVENOUS | Status: DC | PRN

## 2019-09-19 MED ORDER — ENSURE ENLIVE PO LIQD
237.0000 mL | Freq: Three times a day (TID) | ORAL | Status: DC
  Administered 2019-09-20 – 2019-09-21 (×4): 237 mL via ORAL

## 2019-09-19 MED ORDER — ADULT MULTIVITAMIN W/MINERALS CH
1.0000 | ORAL_TABLET | Freq: Every day | ORAL | Status: DC
  Administered 2019-09-20 – 2019-09-21 (×2): 1 via ORAL
  Filled 2019-09-19 (×2): qty 1

## 2019-09-19 NOTE — Procedures (Signed)
Electrical Cardioversion Procedure Note [name] [name] [id number] 1941-04-14  Procedure: Electrical Cardioversion Indications:  Atrial Fibrillation  Procedure Details Consent: Risks of procedure as well as the alternatives and risks of each were explained to the (patient/caregiver).  Consent for procedure obtained. Time Out: Verified patient identification, verified procedure, site/side was marked, verified correct patient position, special equipment/implants available, medications/allergies/relevent history reviewed, required imaging and test results available.  Performed  Patient placed on cardiac monitor, pulse oximetry, supplemental oxygen as necessary.  Sedation given: Per anesthesiology Pacer pads placed anterior and posterior chest.  Cardioverted 1 time(s).  Cardioverted at [place].  Evaluation Findings: Post procedure EKG shows: NSR Complications: None Patient did tolerate procedure well.   [name] 09/19/2019, 1:52 PM

## 2019-09-19 NOTE — Progress Notes (Signed)
  Echocardiogram 2D Echocardiogram limited has been performed.  [name] [name] M 09/19/2019, 10:24 AM

## 2019-09-19 NOTE — Plan of Care (Signed)
  Problem: Health Behavior/Discharge Planning: Goal: Ability to manage health-related needs will improve Outcome: Progressing   Problem: Activity: Goal: Risk for activity intolerance will decrease Outcome: Progressing   Problem: Pain Managment: Goal: General experience of comfort will improve Outcome: Progressing   Problem: Safety: Goal: Ability to remain free from injury will improve Outcome: Progressing   

## 2019-09-19 NOTE — H&P (View-Only) (Signed)
Patient ID: [name] [name], male   DOB: 01/16/1941, 79 y.o.   MRN: [id number]     Advanced Heart Failure Rounding Note  PCP-Cardiologist: No primary care provider on file.   Subjective:    Remains on amio drip and has been taking eliquis.   Denies SOB. NO longer confused.   Objective:   Weight Range: 75.2 kg Body mass index is 26.76 kg/m.   Vital Signs:   Temp:  [97.3 F (36.3 C)-97.8 F (36.6 C)] 97.8 F (36.6 C) (01/06 0505) Pulse Rate:  [82-134] 134 (01/06 0824) Resp:  [16-18] 18 (01/06 0824) BP: (116-144)/(92-100) 135/96 (01/06 0824) SpO2:  [98 %-100 %] 99 % (01/06 0824) Weight:  [75.2 kg] 75.2 kg (01/06 0505) Last BM Date: 09/17/19  Weight change: Filed Weights   09/17/19 0900 09/18/19 0609 09/19/19 0505  Weight: 73.3 kg 74.1 kg 75.2 kg    Intake/Output:   Intake/Output Summary (Last 24 hours) at 09/19/2019 0939 Last data filed at 09/19/2019 0400 Gross per 24 hour  Intake 1344.28 ml  Output 850 ml  Net 494.28 ml      Physical Exam    General:  No resp difficulty HEENT: normal Neck: supple. no JVD. Carotids 2+ bilat; no bruits. No lymphadenopathy or thryomegaly appreciated. Cor: PMI nondisplaced. Tach irregular rate & rhythm. No rubs, gallops or murmurs. Lungs: clear Abdomen: soft, nontender, nondistended. No hepatosplenomegaly. No bruits or masses. Good bowel sounds. Extremities: no cyanosis, clubbing, rash, edema Neuro: alert & orientedx3, cranial nerves grossly intact. moves all 4 extremities w/o difficulty. Affect pleasant   Telemetry    Afib 120s personally reviewed.   Labs    CBC Recent Labs    09/18/19 0345 09/19/19 0458  WBC 6.9 8.5  HGB 12.3* 12.8*  HCT 36.6* 37.0*  MCV 90.1 87.5  PLT 106* 629*   Basic Metabolic Panel Recent Labs    09/18/19 0345 09/19/19 0458  NA 137 140  K 3.6 3.1*  CL 105 109  CO2 17* 19*  GLUCOSE 131* 100*  BUN 11 5*  CREATININE 0.96 0.80  CALCIUM 7.9* 7.8*   Liver Function Tests No results for  input(s): AST, ALT, ALKPHOS, BILITOT, PROT, ALBUMIN in the last 72 hours. No results for input(s): LIPASE, AMYLASE in the last 72 hours. Cardiac Enzymes No results for input(s): CKTOTAL, CKMB, CKMBINDEX, TROPONINI in the last 72 hours.  BNP: BNP (last 3 results) Recent Labs    05/04/19 1620 06/11/19 1235 07/30/19 1028  BNP 1,279.6* 508.3* 126.5*    ProBNP (last 3 results) No results for input(s): PROBNP in the last 8760 hours.   D-Dimer No results for input(s): DDIMER in the last 72 hours. Hemoglobin A1C No results for input(s): HGBA1C in the last 72 hours. Fasting Lipid Panel No results for input(s): CHOL, HDL, LDLCALC, TRIG, CHOLHDL, LDLDIRECT in the last 72 hours. Thyroid Function Tests No results for input(s): TSH, T4TOTAL, T3FREE, THYROIDAB in the last 72 hours.  Invalid input(s): FREET3  Other results:   Imaging    No results found.   Medications:     Scheduled Medications: . acyclovir  400 mg Oral BID  . apixaban  5 mg Oral BID  . dexamethasone  12 mg Oral Q Mon  . DULoxetine  60 mg Oral Daily  . fentaNYL  1 patch Transdermal Q72H  . gabapentin  100 mg Oral q morning - 10a   And  . gabapentin  200 mg Oral QHS  . potassium chloride  40 mEq Oral Q4H  .  sodium chloride flush  3 mL Intravenous Q12H    Infusions: . sodium chloride 20 mL/hr at 09/19/19 0300  . amiodarone 60 mg/hr (09/19/19 0454)  . ceFEPime (MAXIPIME) IV 2 g (09/19/19 0804)  . sodium chloride      PRN Medications: acetaminophen **OR** acetaminophen, diclofenac Sodium, fentaNYL (SUBLIMAZE) injection, haloperidol lactate, LORazepam, ondansetron **OR** ondansetron (ZOFRAN) IV, oxyCODONE, sodium chloride   Assessment/Plan   1. Shock: Resolved. Possible cardiogenic due to atrial fibrillation with RVR and associated with ischemic colitis (abdominal symptoms, high lactate).  - Repeat echo.  2. GI: Abdominal pain with diarrhea at admission associated with elevated lactate, PCT 40.   Concern for ischemic colitis in setting of afib/RVR. Afebrile, remains on cefepime.  Abdominal exam benign today.  Stable hgb and no overt GI bleeding.  3. Atrial fibrillation: History PAF.  Has traditionally tolerated atrial fibrillation poorly.  He remains in afib today, rate is better controlled on amiodarone gtt..  - Continue  apixaban 5 mg po bid .  - Continue amiodarone gtt.  - Plan TEE-guided DCCV today morning dose of apixaban.   4. Bicuspid aortic valve disorder: Ascending aorta was 4.3 cm on recent coronary CTA.He is s/p TAVR with 2 Edwards Sapien THVs (valve-in-valve due to peri-valvular leak after 1st valve placed). Post-op, mean gradient across the aortic valve has been elevated, most recently 26 mmHg in 9/20. Dimensionless index, most recently 0.54, only suggests mild bioprosthetic aortic stenosis.  TEE today  5. AKI: Resolved.  6. Chronic diastolic CHF: Echo in 0/09 with EF >65%.  He is not volume overloaded on exam.  - Volume status stable.  - Limited ECHO today.  7. Foot pain: Severe neuropathic pain in feet, very distressing to him.   8. Multiple myeloma: Per Dr. Irene Limbo.  9. Neuro: Resolved. No longer confused. His wife is wondering if Lorrin Mais may contributed to confusion.    Length of Stay: 5  Amy Clegg, NP  09/19/2019, 9:39 AM  Advanced Heart Failure Team Pager [phone] (M-F; 7a - 4p)  Please contact [facility] for night-coverage after hours (4p -7a ) and weekends on [email or web address]  Patient seen with NP, agree with the above note.   He remains in atrial fibrillation with mild RVR.  Feeling better overall today but still with his intractable bilateral foot pain.   Will plan TEE-guided DCCV today and then will need to mobilize.   Palliative care consulted to help with chronic pain, this has been his main complaint for several months now.   [name] 09/19/2019 10:39 AM

## 2019-09-19 NOTE — Progress Notes (Signed)
CRITICAL VALUE ALERT  Critical Value:  Long QTc (573ms)  Date & Time Notied:  09/19/19 14:10  Provider Notified: Aundra Dubin  Orders Received/Actions taken: None

## 2019-09-19 NOTE — Interval H&P Note (Signed)
History and Physical Interval Note:  09/19/2019 1:14 PM  [name] [name]  has presented today for surgery, with the diagnosis of atrial fibrillation.  The various methods of treatment have been discussed with the patient and family. After consideration of risks, benefits and other options for treatment, the patient has consented to  Procedure(s): TRANSESOPHAGEAL ECHOCARDIOGRAM (TEE) (N/A) CARDIOVERSION (N/A) as a surgical intervention.  The patient's history has been reviewed, patient examined, no change in status, stable for surgery.  I have reviewed the patient's chart and labs.  Questions were answered to the patient's satisfaction.     Candela Krul [employer]

## 2019-09-19 NOTE — Anesthesia Preprocedure Evaluation (Addendum)
Anesthesia Evaluation  Patient identified by MRN, date of birth, ID band Patient awake    Reviewed: Allergy & Precautions, NPO status , Patient's Chart, lab work & pertinent test results  History of Anesthesia Complications Negative for: history of anesthetic complications  Airway Mallampati: II  TM Distance: >3 FB Neck ROM: Full    Dental  (+) Teeth Intact, Dental Advisory Given   Pulmonary neg pulmonary ROS,    breath sounds clear to auscultation       Cardiovascular hypertension, Pt. on home beta blockers and Pt. on medications + CAD and +CHF  + dysrhythmias Atrial Fibrillation + Valvular Problems/Murmurs (s/p TAVR)  Rhythm:Irregular Rate:Tachycardia   '20 TTE - EF >65%. Mildly increased left ventricular wall thickness. Left ventricular diastolic Doppler parameters are consistent with impaired relaxation. Peak trans TAVR velocity - 3.91ms, mean 217mg. Pulmonic valve regurgitation is trivial. There is dilatation of the ascending aorta measuring 44 mm.  '20 RHC - Normal filling pressures and cardiac output.  Peripheral edema is likely primarily due to venous insufficiency.     Neuro/Psych negative neurological ROS  negative psych ROS   GI/Hepatic negative GI ROS, Neg liver ROS,   Endo/Other   Hypokalemia   Renal/GU negative Renal ROS     Musculoskeletal  (+) Arthritis ,  Polymyalgia rheumatica    Abdominal   Peds  Hematology  (+) anemia ,  INR 1.3 Thrombocytopenia    Anesthesia Other Findings   Reproductive/Obstetrics                            Anesthesia Physical Anesthesia Plan  ASA: III  Anesthesia Plan: MAC and General   Post-op Pain Management:    Induction: Intravenous  PONV Risk Score and Plan: 2 and Propofol infusion and Treatment may vary due to age or medical condition  Airway Management Planned: Nasal Cannula and Natural Airway  Additional Equipment:  None  Intra-op Plan:   Post-operative Plan:   Informed Consent:   Plan Discussed with: CRNA and Anesthesiologist  Anesthesia Plan Comments: (Start with MAC, may convert to GA with natural airway if performing cardioversion)        Anesthesia Quick Evaluation

## 2019-09-19 NOTE — Progress Notes (Addendum)
Patient ID: [name] [name], male   DOB: Feb 06, 1941, 79 y.o.   MRN: [id number]     Advanced Heart Failure Rounding Note  PCP-Cardiologist: No primary care provider on file.   Subjective:    Remains on amio drip and has been taking eliquis.   Denies SOB. NO longer confused.   Objective:   Weight Range: 75.2 kg Body mass index is 26.76 kg/m.   Vital Signs:   Temp:  [97.3 F (36.3 C)-97.8 F (36.6 C)] 97.8 F (36.6 C) (01/06 0505) Pulse Rate:  [82-134] 134 (01/06 0824) Resp:  [16-18] 18 (01/06 0824) BP: (116-144)/(92-100) 135/96 (01/06 0824) SpO2:  [98 %-100 %] 99 % (01/06 0824) Weight:  [75.2 kg] 75.2 kg (01/06 0505) Last BM Date: 09/17/19  Weight change: Filed Weights   09/17/19 0900 09/18/19 0609 09/19/19 0505  Weight: 73.3 kg 74.1 kg 75.2 kg    Intake/Output:   Intake/Output Summary (Last 24 hours) at 09/19/2019 0939 Last data filed at 09/19/2019 0400 Gross per 24 hour  Intake 1344.28 ml  Output 850 ml  Net 494.28 ml      Physical Exam    General:  No resp difficulty HEENT: normal Neck: supple. no JVD. Carotids 2+ bilat; no bruits. No lymphadenopathy or thryomegaly appreciated. Cor: PMI nondisplaced. Tach irregular rate & rhythm. No rubs, gallops or murmurs. Lungs: clear Abdomen: soft, nontender, nondistended. No hepatosplenomegaly. No bruits or masses. Good bowel sounds. Extremities: no cyanosis, clubbing, rash, edema Neuro: alert & orientedx3, cranial nerves grossly intact. moves all 4 extremities w/o difficulty. Affect pleasant   Telemetry    Afib 120s personally reviewed.   Labs    CBC Recent Labs    09/18/19 0345 09/19/19 0458  WBC 6.9 8.5  HGB 12.3* 12.8*  HCT 36.6* 37.0*  MCV 90.1 87.5  PLT 106* 347*   Basic Metabolic Panel Recent Labs    09/18/19 0345 09/19/19 0458  NA 137 140  K 3.6 3.1*  CL 105 109  CO2 17* 19*  GLUCOSE 131* 100*  BUN 11 5*  CREATININE 0.96 0.80  CALCIUM 7.9* 7.8*   Liver Function Tests No results for  input(s): AST, ALT, ALKPHOS, BILITOT, PROT, ALBUMIN in the last 72 hours. No results for input(s): LIPASE, AMYLASE in the last 72 hours. Cardiac Enzymes No results for input(s): CKTOTAL, CKMB, CKMBINDEX, TROPONINI in the last 72 hours.  BNP: BNP (last 3 results) Recent Labs    05/04/19 1620 06/11/19 1235 07/30/19 1028  BNP 1,279.6* 508.3* 126.5*    ProBNP (last 3 results) No results for input(s): PROBNP in the last 8760 hours.   D-Dimer No results for input(s): DDIMER in the last 72 hours. Hemoglobin A1C No results for input(s): HGBA1C in the last 72 hours. Fasting Lipid Panel No results for input(s): CHOL, HDL, LDLCALC, TRIG, CHOLHDL, LDLDIRECT in the last 72 hours. Thyroid Function Tests No results for input(s): TSH, T4TOTAL, T3FREE, THYROIDAB in the last 72 hours.  Invalid input(s): FREET3  Other results:   Imaging    No results found.   Medications:     Scheduled Medications: . acyclovir  400 mg Oral BID  . apixaban  5 mg Oral BID  . dexamethasone  12 mg Oral Q Mon  . DULoxetine  60 mg Oral Daily  . fentaNYL  1 patch Transdermal Q72H  . gabapentin  100 mg Oral q morning - 10a   And  . gabapentin  200 mg Oral QHS  . potassium chloride  40 mEq Oral Q4H  .  sodium chloride flush  3 mL Intravenous Q12H    Infusions: . sodium chloride 20 mL/hr at 09/19/19 0300  . amiodarone 60 mg/hr (09/19/19 0454)  . ceFEPime (MAXIPIME) IV 2 g (09/19/19 0804)  . sodium chloride      PRN Medications: acetaminophen **OR** acetaminophen, diclofenac Sodium, fentaNYL (SUBLIMAZE) injection, haloperidol lactate, LORazepam, ondansetron **OR** ondansetron (ZOFRAN) IV, oxyCODONE, sodium chloride   Assessment/Plan   1. Shock: Resolved. Possible cardiogenic due to atrial fibrillation with RVR and associated with ischemic colitis (abdominal symptoms, high lactate).  - Repeat echo.  2. GI: Abdominal pain with diarrhea at admission associated with elevated lactate, PCT 40.   Concern for ischemic colitis in setting of afib/RVR. Afebrile, remains on cefepime.  Abdominal exam benign today.  Stable hgb and no overt GI bleeding.  3. Atrial fibrillation: History PAF.  Has traditionally tolerated atrial fibrillation poorly.  He remains in afib today, rate is better controlled on amiodarone gtt..  - Continue  apixaban 5 mg po bid .  - Continue amiodarone gtt.  - Plan TEE-guided DCCV today morning dose of apixaban.   4. Bicuspid aortic valve disorder: Ascending aorta was 4.3 cm on recent coronary CTA.He is s/p TAVR with 2 Edwards Sapien THVs (valve-in-valve due to peri-valvular leak after 1st valve placed). Post-op, mean gradient across the aortic valve has been elevated, most recently 26 mmHg in 9/20. Dimensionless index, most recently 0.54, only suggests mild bioprosthetic aortic stenosis.  TEE today  5. AKI: Resolved.  6. Chronic diastolic CHF: Echo in 3/12 with EF >65%.  He is not volume overloaded on exam.  - Volume status stable.  - Limited ECHO today.  7. Foot pain: Severe neuropathic pain in feet, very distressing to him.   8. Multiple myeloma: Per Dr. Irene Limbo.  9. Neuro: Resolved. No longer confused. His wife is wondering if Lorrin Mais may contributed to confusion.    Length of Stay: 5  Amy Clegg, NP  09/19/2019, 9:39 AM  Advanced Heart Failure Team Pager [phone] (M-F; 7a - 4p)  Please contact [facility] for night-coverage after hours (4p -7a ) and weekends on [email or web address]  Patient seen with NP, agree with the above note.   He remains in atrial fibrillation with mild RVR.  Feeling better overall today but still with his intractable bilateral foot pain.   Will plan TEE-guided DCCV today and then will need to mobilize.   Palliative care consulted to help with chronic pain, this has been his main complaint for several months now.   [name] 09/19/2019 10:39 AM

## 2019-09-19 NOTE — Progress Notes (Signed)
Echocardiogram Echocardiogram Transesophageal has been performed.  Oneal Deputy Garrett Bowring 09/19/2019, 1:58 PM

## 2019-09-19 NOTE — CV Procedure (Signed)
Procedure: TEE  Sedation: Per anesthesiology.   Indication: Atrial fibrillation  Findings: Please see echo section for full report.  Normal LV size with moderate LV hypertrophy.  EF 60-65%.  Normal wall motion.  Normal right ventricular size and systolic function.  Mild left atrial enlargement with no LA appendage thrombus.  Normal right atrium.  No PFO/ASD by color doppler.  Trivial TR.  Mild mitral regurgitation.  S/p valve-in-valve TAVR with bioprosthetic aortic valve.  The valve was well-seated with no significant peri-valvular leakage noted and mean gradient 8 mmHg (may be underestimated).  Ascending aorta measured 4.5 cm.  There was mild plaque in the descending thoracic aorta.   May proceed to DCCV.   [name] 09/19/2019 1:51 PM

## 2019-09-19 NOTE — Anesthesia Postprocedure Evaluation (Signed)
Anesthesia Post Note  Patient: Khalin Sharples  Procedure(s) Performed: TRANSESOPHAGEAL ECHOCARDIOGRAM (TEE) (N/A ) CARDIOVERSION (N/A )     Patient location during evaluation: PACU Anesthesia Type: General Level of consciousness: awake and alert Pain management: pain level controlled Vital Signs Assessment: post-procedure vital signs reviewed and stable Respiratory status: spontaneous breathing, nonlabored ventilation and respiratory function stable Cardiovascular status: blood pressure returned to baseline and stable Postop Assessment: no apparent nausea or vomiting Anesthetic complications: no    Last Vitals:  Vitals:   09/19/19 1401 09/19/19 1420  BP: 121/71 140/82  Pulse: 78 72  Resp: (!) 22 18  Temp: 36.5 C   SpO2: 99% 98%    Last Pain:  Vitals:   09/19/19 1420  TempSrc:   PainSc: 0-No pain                 Audry Pili

## 2019-09-19 NOTE — Transfer of Care (Signed)
Immediate Anesthesia Transfer of Care Note  Patient: [name] [name]  Procedure(s) Performed: TRANSESOPHAGEAL ECHOCARDIOGRAM (TEE) (N/A ) CARDIOVERSION (N/A )  Patient Location: Endoscopy Unit  Anesthesia Type:MAC  Level of Consciousness: awake, alert  and oriented  Airway & Oxygen Therapy: Patient Spontanous Breathing and Patient connected to nasal cannula oxygen  Post-op Assessment: Report given to RN, Post -op Vital signs reviewed and stable and Patient moving all extremities  Post vital signs: Reviewed and stable  Last Vitals:  Vitals Value Taken Time  BP 121/71 09/19/19 1401  Temp    Pulse 75 09/19/19 1407  Resp 22 09/19/19 1407  SpO2 100 % 09/19/19 1407  Vitals shown include unvalidated device data.  Last Pain:  Vitals:   09/19/19 1245  TempSrc: Temporal  PainSc: 8       Patients Stated Pain Goal: 5 (38/88/75 7972)  Complications: No apparent anesthesia complications

## 2019-09-19 NOTE — Progress Notes (Signed)
Pharmacy Antibiotic Note  Maya Oria is a 79 y.o. male admitted on 09/13/2019 with abdominal pain and diarrhea.  Pharmacy has been consulted for cefepime dosing for sepsis.  SCr now 0.8, CrCL 67 ml/min, afebrile, WBC WNL, LA down to 1.5 on last check.  Plan: Cefepime 2gm IV Q8H for total of 10 days per MD Monitor renal fxn, clinical progress, vanc AUC as indicated  Height: 5\' 6"  (167.6 cm) Weight: 165 lb 12.6 oz (75.2 kg) IBW/kg (Calculated) : 63.8  Temp (24hrs), Avg:97.5 F (36.4 C), Min:97.3 F (36.3 C), Max:97.8 F (36.6 C)  Recent Labs  Lab 09/13/19 2325 09/14/19 0609 09/14/19 1027 09/14/19 1615 09/15/19 0420 09/15/19 1109 09/16/19 0845 09/17/19 0704 09/18/19 0345 09/19/19 0458  WBC 6.1  --   --  15.4* 10.0 10.2 7.5 7.8 6.9 8.5  CREATININE 2.02*  --   --   --  1.53*  --  0.91 0.89 0.96 0.80  LATICACIDVEN 6.1* 3.5* 1.7 1.5  --   --   --   --   --   --     Estimated Creatinine Clearance: 67.6 mL/min (by C-G formula based on SCr of 0.8 mg/dL).    Allergies  Allergen Reactions  . Xarelto [Rivaroxaban] Other (See Comments) and Hypertension    INCREASED BP-HYPERTENSIVE EVENTS  . Corticosteroids Other (See Comments)    Made the patient  "sick," feel "weird," and his "body rejected" them   . Ramipril Other (See Comments)    Could not eat or sleep, lost muscle mass  . Benazepril Cough   Vanc 1/1 >>1/3 Cefepime 1/1 >>  1/1 covid & flu- negative 1/1 C.diff PCR - neg 1/1 GI panel PCR - neg 1/2 Ucx: NG  Antonietta Jewel, PharmD, BCCCP Clinical Pharmacist  Phone: [phone]  Please check AMION for all [place] phone numbers After 10:00 PM, call [place] [phone] 09/19/2019, 10:19 AM

## 2019-09-19 NOTE — Progress Notes (Signed)
Physical Therapy Cancellation Note   09/19/19 1339  PT Visit Information  Last PT Received On 09/19/19  Reason Eval/Treat Not Completed Patient at procedure or test/unavailable. Pt off unit for cardioversion. PT will continue to follow acutely.    Earney Navy, PTA Acute Rehabilitation Services Pager: [phone] Office: [phone]

## 2019-09-19 NOTE — Progress Notes (Signed)
PROGRESS NOTE    [name] [name]  GYJ:[id number] DOB: June 29, 1941 DOA: 09/13/2019 PCP: Cassandria Anger, MD     Brief Narrative:  [name] [name] is a 79 y.o. male with medical history significant of Paroxysmal A fib on Eliquis; HTN; HLD; chronic diastolic CHF; s/p TAVR in Aug 2020; and multiple myeloma with metastatic disease followed by Dr. Irene Limbo presenting with abdominal pain. Wife reports that he was diagnosed with bone cancer in August.  They found it while undergoing work-up for TAVR.  He was diagnosed with multiple myeloma, was undergoing infusions, none in 2-3 weeks.  He has lost a lot of weight.  Last night, he was vomiting profusely, cold/sweaty, hard of breathing.  He started acutely last night, and developed diarrhea about a 30 minutes later.  No fevers.  He was admitted for severe sepsis, thought to be abdominal etiology as he presented with bloody diarrhea as well as altered mental status.  C. difficile was negative.  1/3: Overnight, patient went into A. fib RVR with relative hypotension.  His heart rate is ranging 140-160s, irregular in rhythm.  His blood pressure also has been relatively low, most recent SBP 94.  Amiodarone gtt started and cardiology consulted 1/4: Overnight, patient had significant agitation, combativeness. Wife allowed at bedside after visiting hours due to sundowning.  1/5: No new issues. Abd pain improved. Remains in A fib RVR on amio gtt   New events last 24 hours / Subjective: Patient's mentation seems to be much improved today.  He denies any chest pain or abdominal pain.  His only complaint remains severe neuropathic leg pain.  Assessment & Plan:   Principal Problem:   Atrial fibrillation with RVR (HCC) Active Problems:   Essential hypertension   PAF (paroxysmal atrial fibrillation) (HCC)   Acute kidney injury superimposed on CKD (HCC)   S/P TAVR (transcatheter aortic valve replacement)   Multiple myeloma not having achieved remission (HCC)   Bone  metastases (HCC)   Peripheral neuropathy   Bloody diarrhea   Abdominal pain, diffuse   Acute metabolic encephalopathy   Goals of care, counseling/discussion   DNR (do not resuscitate)   Pressure injury of skin   Ischemic colitis ([place])   Severe sepsis secondary to ischemic colitis as well as altered mental status on admission, sepsis present on admission -C. difficile negative, GI PCR negative -Started on empiric vancomycin and cefepime --> continue cefepime -Improving   Acute metabolic encephalopathy -Patient was noted to be obtunded at the time of admission -Has intermittent episodes of combativeness and confusion.  Likely due to sundowning.   A. fib RVR -History of paroxysmal atrial fibrillation, followed by Dr. Aundra Dubin.  Pt has required cardioversion in the past -Eliquis -Amiodarone drip -Cardiology following, planning for TEE-guided DCCV 1/6   AKI -Baseline creatinine 0.87  -Cr peaked at 2.02 and now AKI resolved after IVF  Multiple myeloma, plasma cell myeloma -Patient follows with Dr. Irene Limbo. Has associated history of hypercalcemia, currently on Zometa. Oncology curbsided on admission with recommendations for supportive care. On Acyclovir and weekly Decadron, Revlimid   Peripheral neuropathy appears to be secondary to chemotherapy -Continue home Neurontin (watch for renal function), Cymbalta and oxycodone -Palliative care consulted for symptom management   Chronic diastolic heart failure -No acute exacerbation at this time -Home Lasix and spironolactone held due to AKI on admission  Bicuspid aortic valve disorder -Status post TAVR August 2020  Dysuria -Urinalysis does not suggest infection. No known history of BPH. Urine culture negative  Hypokalemia -Replace, trend  In agreement with assessment of the pressure ulcer as below:  Pressure Injury 09/14/19 Sacrum Anterior Stage 2 -  Partial thickness loss of dermis presenting as a shallow open injury  with a red, pink wound bed without slough. (Active)  09/14/19 1621  Location: Sacrum  Location Orientation: Anterior  Staging: Stage 2 -  Partial thickness loss of dermis presenting as a shallow open injury with a red, pink wound bed without slough.  Wound Description (Comments):   Present on Admission: Yes         DVT prophylaxis: Eliquis Code Status: DNR Family Communication: No family at bedside Disposition Plan: Planning for cardioversion 1/6    Consultants:   Oncology (curbside)  Cardiology  Procedures:   None  Antimicrobials:  Anti-infectives (From admission, onward)   Start     Dose/Rate Route Frequency Ordered Stop   09/16/19 1000  vancomycin (VANCOREADY) IVPB 1500 mg/300 mL  Status:  Discontinued     1,500 mg 150 mL/hr over 120 Minutes Intravenous Every 48 hours 09/14/19 0857 09/16/19 1011   09/14/19 2000  ceFEPIme (MAXIPIME) 2 g in sodium chloride 0.9 % 100 mL IVPB     2 g 200 mL/hr over 30 Minutes Intravenous Every 12 hours 09/14/19 0857     09/14/19 1000  acyclovir (ZOVIRAX) tablet 400 mg     400 mg Oral 2 times daily 09/14/19 0854     09/14/19 0630  vancomycin (VANCOREADY) IVPB 1500 mg/300 mL     1,500 mg 150 mL/hr over 120 Minutes Intravenous  Once 09/14/19 0624 09/14/19 1230   09/14/19 0630  ceFEPIme (MAXIPIME) 2 g in sodium chloride 0.9 % 100 mL IVPB     2 g 200 mL/hr over 30 Minutes Intravenous  Once 09/14/19 0624 09/14/19 0930       Objective: Vitals:   09/18/19 1129 09/18/19 2031 09/19/19 0505 09/19/19 0824  BP: (!) 116/92 (!) 144/100 (!) 144/100 (!) 135/96  Pulse: 82 (!) 105 (!) 110 (!) 134  Resp: '18 16 16 18  '$ Temp: (!) 97.3 F (36.3 C) (!) 97.4 F (36.3 C) 97.8 F (36.6 C)   TempSrc: Oral Oral Oral   SpO2: 100% 99% 98% 99%  Weight:   75.2 kg   Height:        Intake/Output Summary (Last 24 hours) at 09/19/2019 0900 Last data filed at 09/19/2019 0400 Gross per 24 hour  Intake 1344.28 ml  Output 850 ml  Net 494.28 ml   Filed  Weights   09/17/19 0900 09/18/19 0609 09/19/19 0505  Weight: 73.3 kg 74.1 kg 75.2 kg    Examination: General exam: Appears calm and comfortable  Respiratory system: Clear to auscultation. Respiratory effort normal. Cardiovascular system: S1 & S2 heard, irregular rhythm, rate 100s. No pedal edema. Gastrointestinal system: Abdomen is nondistended, soft and nontender. Normal bowel sounds heard. Central nervous system: Alert. Non focal exam. Speech clear  Extremities: Symmetric in appearance bilaterally  Psychiatry: Stable   Data Reviewed: I have personally reviewed following labs and imaging studies  CBC: Recent Labs  Lab 09/13/19 2325 09/15/19 1109 09/16/19 0845 09/17/19 0704 09/18/19 0345 09/19/19 0458  WBC 6.1 10.2 7.5 7.8 6.9 8.5  NEUTROABS 5.1  --   --   --   --   --   HGB 16.9 13.1 11.7* 11.8* 12.3* 12.8*  HCT 50.8 39.1 35.3* 35.3* 36.6* 37.0*  MCV 91.4 90.3 92.4 89.8 90.1 87.5  PLT 173 87* 80* 86* 106* 161*   Basic Metabolic Panel: Recent  Labs  Lab 09/15/19 0420 09/16/19 0845 09/17/19 0704 09/18/19 0345 09/19/19 0458  NA 139 137 138 137 140  K 4.8 3.9 3.2* 3.6 3.1*  CL 109 109 108 105 109  CO2 18* 19* 20* 17* 19*  GLUCOSE 110* 91 91 131* 100*  BUN 41* '19 11 11 '$ 5*  CREATININE 1.53* 0.91 0.89 0.96 0.80  CALCIUM 8.4* 7.8* 7.9* 7.9* 7.8*   GFR: Estimated Creatinine Clearance: 67.6 mL/min (by C-G formula based on SCr of 0.8 mg/dL). Liver Function Tests: Recent Labs  Lab 09/13/19 2325 09/16/19 0845  AST 39 30  ALT 29 25  ALKPHOS 126 95  BILITOT 1.6* 0.7  PROT 7.4 4.3*  ALBUMIN 5.2* 2.7*   No results for input(s): LIPASE, AMYLASE in the last 168 hours. No results for input(s): AMMONIA in the last 168 hours. Coagulation Profile: Recent Labs  Lab 09/14/19 1027 09/19/19 0458  INR 1.5* 1.3*   Cardiac Enzymes: No results for input(s): CKTOTAL, CKMB, CKMBINDEX, TROPONINI in the last 168 hours. BNP (last 3 results) No results for input(s): PROBNP in  the last 8760 hours. HbA1C: No results for input(s): HGBA1C in the last 72 hours. CBG: Recent Labs  Lab 09/18/19 0605 09/18/19 1130 09/18/19 1621 09/18/19 2151 09/19/19 0629  GLUCAP 97 105* 100* 101* 103*   Lipid Profile: No results for input(s): CHOL, HDL, LDLCALC, TRIG, CHOLHDL, LDLDIRECT in the last 72 hours. Thyroid Function Tests: No results for input(s): TSH, T4TOTAL, FREET4, T3FREE, THYROIDAB in the last 72 hours. Anemia Panel: No results for input(s): VITAMINB12, FOLATE, FERRITIN, TIBC, IRON, RETICCTPCT in the last 72 hours. Sepsis Labs: Recent Labs  Lab 09/13/19 2325 09/14/19 0609 09/14/19 1027 09/14/19 1615  PROCALCITON  --   --  48.04  --   LATICACIDVEN 6.1* 3.5* 1.7 1.5    Recent Results (from the past 240 hour(s))  GI pathogen panel by PCR, stool     Status: None   Collection Time: 09/14/19  1:00 AM   Specimen: Stool  Result Value Ref Range Status   Plesiomonas shigelloides NOT DETECTED NOT DETECTED Final   Yersinia enterocolitica NOT DETECTED NOT DETECTED Final   Vibrio NOT DETECTED NOT DETECTED Final   Enteropathogenic E coli NOT DETECTED NOT DETECTED Final   E coli (ETEC) LT/ST NOT DETECTED NOT DETECTED Final   E coli 4098 by PCR Not applicable NOT DETECTED Final   Cryptosporidium by PCR NOT DETECTED NOT DETECTED Final   Entamoeba histolytica NOT DETECTED NOT DETECTED Final   Adenovirus F 40/41 NOT DETECTED NOT DETECTED Final   Norovirus GI/GII NOT DETECTED NOT DETECTED Final   Sapovirus NOT DETECTED NOT DETECTED Final    Comment: (NOTE) Performed At: [facility] [place], [place] [id number] Rush Farmer MD FA:[id number]    Vibrio cholerae NOT DETECTED NOT DETECTED Final   Campylobacter by PCR NOT DETECTED NOT DETECTED Final   Salmonella by PCR NOT DETECTED NOT DETECTED Final   E coli (STEC) NOT DETECTED NOT DETECTED Final   Enteroaggregative E coli NOT DETECTED NOT DETECTED Final   Shigella by PCR NOT DETECTED NOT  DETECTED Final   Cyclospora cayetanensis NOT DETECTED NOT DETECTED Final   Astrovirus NOT DETECTED NOT DETECTED Final   G lamblia by PCR NOT DETECTED NOT DETECTED Final   Rotavirus A by PCR NOT DETECTED NOT DETECTED Final  C difficile quick scan w PCR reflex     Status: None   Collection Time: 09/14/19  1:00 AM   Specimen: Stool  Result Value Ref Range Status   C Diff antigen NEGATIVE NEGATIVE Final   C Diff toxin NEGATIVE NEGATIVE Final   C Diff interpretation No C. difficile detected.  Final    Comment: Performed at [facility], [place] [address], [place], [place] [address]  Respiratory Panel by RT PCR (Flu A&B, Covid) - Nasopharyngeal Swab     Status: None   Collection Time: 09/14/19  1:16 AM   Specimen: Nasopharyngeal Swab  Result Value Ref Range Status   SARS Coronavirus 2 by RT PCR NEGATIVE NEGATIVE Final    Comment: (NOTE) SARS-CoV-2 target nucleic acids are NOT DETECTED. The SARS-CoV-2 RNA is generally detectable in upper respiratoy specimens during the acute phase of infection. The lowest concentration of SARS-CoV-2 viral copies this assay can detect is 131 copies/mL. A negative result does not preclude SARS-Cov-2 infection and should not be used as the sole basis for treatment or other patient management decisions. A negative result may occur with  improper specimen collection/handling, submission of specimen other than nasopharyngeal swab, presence of viral mutation(s) within the areas targeted by this assay, and inadequate number of viral copies (<131 copies/mL). A negative result must be combined with clinical observations, patient history, and epidemiological information. The expected result is Negative. Fact Sheet for Patients:  PinkCheek.be Fact Sheet for Healthcare Providers:  GravelBags.it This test is not yet ap proved or cleared by the [place] FDA and  has been authorized for detection and/or  diagnosis of SARS-CoV-2 by FDA under an Emergency Use Authorization (EUA). This EUA will remain  in effect (meaning this test can be used) for the duration of the COVID-19 declaration under Section 564(b)(1) of the Act, 21 U.S.C. section 360bbb-3(b)(1), unless the authorization is terminated or revoked sooner.    Influenza A by PCR NEGATIVE NEGATIVE Final   Influenza B by PCR NEGATIVE NEGATIVE Final    Comment: (NOTE) The Xpert Xpress SARS-CoV-2/FLU/RSV assay is intended as an aid in  the diagnosis of influenza from Nasopharyngeal swab specimens and  should not be used as a sole basis for treatment. Nasal washings and  aspirates are unacceptable for Xpert Xpress SARS-CoV-2/FLU/RSV  testing. Fact Sheet for Patients: PinkCheek.be Fact Sheet for Healthcare Providers: GravelBags.it This test is not yet approved or cleared by the [place] FDA and  has been authorized for detection and/or diagnosis of SARS-CoV-2 by  FDA under an Emergency Use Authorization (EUA). This EUA will remain  in effect (meaning this test can be used) for the duration of the  Covid-19 declaration under Section 564(b)(1) of the Act, 21  U.S.C. section 360bbb-3(b)(1), unless the authorization is  terminated or revoked. Performed at [facility], [place] [address] [place], [place] [address]   Culture, Urine     Status: None   Collection Time: 09/15/19 11:36 AM   Specimen: Urine, Clean Catch  Result Value Ref Range Status   Specimen Description URINE, CLEAN CATCH  Final   Special Requests NONE  Final   Culture   Final    NO GROWTH Performed at [facility], [place] [address] [place], [place] [address]    Report Status 09/16/2019 FINAL  Final      Radiology Studies: No results found.    Scheduled Meds: . acyclovir  400 mg Oral BID  . apixaban  5 mg Oral BID  . dexamethasone  12 mg Oral Q Mon  . DULoxetine  60 mg Oral Daily  .  fentaNYL  1  patch Transdermal Q72H  . gabapentin  100 mg Oral q morning - 10a   And  . gabapentin  200 mg Oral QHS  . potassium chloride  40 mEq Oral Q4H  . sodium chloride flush  3 mL Intravenous Q12H   Continuous Infusions: . sodium chloride 20 mL/hr at 09/19/19 0300  . amiodarone 60 mg/hr (09/19/19 0454)  . ceFEPime (MAXIPIME) IV 2 g (09/19/19 0804)  . sodium chloride       LOS: 5 days      Time spent: 25 minutes   Dessa Phi, DO [facility] 09/19/2019, 9:00 AM   Available via Epic secure chat 7am-7pm After these hours, please refer to coverage provider listed on [email or web address]

## 2019-09-19 NOTE — Anesthesia Procedure Notes (Signed)
Procedure Name: MAC Date/Time: 09/19/2019 1:30 PM Performed by: Amadeo Garnet, CRNA Pre-anesthesia Checklist: Patient identified, Emergency Drugs available, Suction available and Patient being monitored Patient Re-evaluated:Patient Re-evaluated prior to induction Oxygen Delivery Method: Nasal cannula Preoxygenation: Pre-oxygenation with 100% oxygen Induction Type: IV induction Placement Confirmation: positive ETCO2 Dental Injury: Teeth and Oropharynx as per pre-operative assessment

## 2019-09-19 NOTE — Progress Notes (Signed)
Initial Nutrition Assessment  RD working remotely.  DOCUMENTATION CODES:   Not applicable  INTERVENTION:   -Once diet is advanced, add:   -Ensure Enlive po TID, each supplement provides 350 kcal and 20 grams of protein -MVI with minerals daily  NUTRITION DIAGNOSIS:   Increased nutrient needs related to cancer and cancer related treatments as evidenced by estimated needs.  GOAL:   Patient will meet greater than or equal to 90% of their needs  MONITOR:   PO intake, Supplement acceptance, Diet advancement, Labs, Weight trends, Skin, I & O's  REASON FOR ASSESSMENT:   Malnutrition Screening Tool, Consult Assessment of nutrition requirement/status  ASSESSMENT:   [name] [name] is a 79 y.o. male with medical history significant of afib on Eliquis; HTN; HLD; chronic diastolic CHF; s/p TAVR; and multiple myeloma with metastatic disease presenting with abdominal pain.  He is unable to provide history at this time.  RN reports copious frankly bloody diarrhea.  Pt admitted with abdominal pain and bloody diarrhea.   1/3- rapid response called due to tachycardia; IV amiodarone started  Reviewed I/O's: +474 ml x 24 hours and +8.2 L since admission  UOP: 1 L x 24 hours  Attempted to speak with pt via phone, however, no answer. RD unable to obtain further nutrition-related history at this time.   Per chart review, pt's mentation has improved over the past several days. Pt has been experiencing periods of AMS, confusion, and combativeness during hospitalization.   Pt with variable oral intake; noted meal completion 10-75%. He is currently NPO for TEE today.   Per chart review, pt was diagnosed with multiple myeloma in August 2020 after undergoing TAVR work-up. Pt has experienced a general decline in health since multiple myeloma diagnosis, including neuropathy, decreased oral intake, and weight loss. Reviewed wt hx; noted pt has experienced a 7.8% wt loss over the past month, which is  significant for time frame. Pt is a thigh risk for malnutrition, however, RD unable to identify at this time. Pt would greatly benefit from addition of oral nutrition supplements once diet is advanced.   Medications reviewed and include decadron and 0.9% sodium chloride infusion @ 20 ml/hr.   Labs reviewed: K: 3.1 (on PO supplementation), CBGS: 100-103.  Diet Order:   Diet Order            Diet NPO time specified  Diet effective midnight        Diet NPO time specified  Diet effective now              EDUCATION NEEDS:   No education needs have been identified at this time  Skin:  Skin Assessment: Skin Integrity Issues: Skin Integrity Issues:: Stage II, Other (Comment) Stage II: sacrum Other: MASD to goin and buttocks  Last BM:  09/17/19  Height:   Ht Readings from Last 1 Encounters:  09/17/19 _0  (1.676 m)    Weight:   Wt Readings from Last 1 Encounters:  09/19/19 75.2 kg    Ideal Body Weight:  64.5 kg  BMI:  Body mass index is 26.76 kg/m.  Estimated Nutritional Needs:   Kcal:  2150-2350  Protein:  105-120 grams  Fluid:  > 2.1 L    Draco Malczewski A. Jimmye Norman, RD, LDN, [place] Registered Dietitian II Certified Diabetes Care and Education Specialist Pager: [phone] After hours Pager: [phone]

## 2019-09-20 ENCOUNTER — Ambulatory Visit: Payer: Medicare Other | Admitting: Neurology

## 2019-09-20 DIAGNOSIS — C9000 Multiple myeloma not having achieved remission: Secondary | ICD-10-CM

## 2019-09-20 LAB — BASIC METABOLIC PANEL
Anion gap: 12 (ref 5–15)
BUN: 7 mg/dL — ABNORMAL LOW (ref 8–23)
CO2: 21 mmol/L — ABNORMAL LOW (ref 22–32)
Calcium: 7.8 mg/dL — ABNORMAL LOW (ref 8.9–10.3)
Chloride: 108 mmol/L (ref 98–111)
Creatinine, Ser: 0.73 mg/dL (ref 0.61–1.24)
GFR calc Af Amer: >60 mL/min (ref >60)
GFR calc non Af Amer: >60 mL/min (ref >60)
Glucose, Bld: 95 mg/dL (ref 70–99)
Potassium: 3.7 mmol/L (ref 3.5–5.1)
Sodium: 141 mmol/L (ref 135–145)

## 2019-09-20 LAB — CBC
HCT: 33.5 % — ABNORMAL LOW (ref 39.0–52.0)
Hemoglobin: 11.5 g/dL — ABNORMAL LOW (ref 13.0–17.0)
MCH: 30.3 pg (ref 26.0–34.0)
MCHC: 34.3 g/dL (ref 30.0–36.0)
MCV: 88.2 fL (ref 80.0–100.0)
Platelets: 144 10*3/uL — ABNORMAL LOW (ref 150–400)
RBC: 3.8 MIL/uL — ABNORMAL LOW (ref 4.22–5.81)
RDW: 13.1 % (ref 11.5–15.5)
WBC: 7.4 10*3/uL (ref 4.0–10.5)
nRBC: 0 % (ref 0.0–0.2)

## 2019-09-20 LAB — GLUCOSE, CAPILLARY
Glucose-Capillary: 94 mg/dL (ref 70–99)
Glucose-Capillary: 94 mg/dL (ref 70–99)
Glucose-Capillary: 96 mg/dL (ref 70–99)

## 2019-09-20 LAB — MAGNESIUM: Magnesium: 1.6 mg/dL — ABNORMAL LOW (ref 1.7–2.4)

## 2019-09-20 MED ORDER — AMIODARONE HCL 200 MG PO TABS
200.0000 mg | ORAL_TABLET | Freq: Two times a day (BID) | ORAL | Status: DC
  Administered 2019-09-20 – 2019-09-21 (×3): 200 mg via ORAL
  Filled 2019-09-20 (×3): qty 1

## 2019-09-20 MED ORDER — MAGNESIUM SULFATE 4 GM/100ML IV SOLN
4.0000 g | Freq: Once | INTRAVENOUS | Status: AC
  Administered 2019-09-20: 4 g via INTRAVENOUS
  Filled 2019-09-20: qty 100

## 2019-09-20 MED ORDER — METOPROLOL SUCCINATE ER 25 MG PO TB24
25.0000 mg | ORAL_TABLET | Freq: Every day | ORAL | Status: DC
  Administered 2019-09-20 – 2019-09-21 (×2): 25 mg via ORAL
  Filled 2019-09-20 (×2): qty 1

## 2019-09-20 MED ORDER — METRONIDAZOLE IN NACL 5-0.79 MG/ML-% IV SOLN
500.0000 mg | Freq: Three times a day (TID) | INTRAVENOUS | Status: DC
  Administered 2019-09-20 – 2019-09-21 (×4): 500 mg via INTRAVENOUS
  Filled 2019-09-20 (×4): qty 100

## 2019-09-20 MED ORDER — FUROSEMIDE 20 MG PO TABS
20.0000 mg | ORAL_TABLET | Freq: Every day | ORAL | Status: DC
  Administered 2019-09-20 – 2019-09-21 (×2): 20 mg via ORAL
  Filled 2019-09-20 (×2): qty 1

## 2019-09-20 MED ORDER — SPIRONOLACTONE 25 MG PO TABS
25.0000 mg | ORAL_TABLET | Freq: Every day | ORAL | Status: DC
  Administered 2019-09-20 – 2019-09-21 (×2): 25 mg via ORAL
  Filled 2019-09-20 (×2): qty 1

## 2019-09-20 MED ORDER — CIPROFLOXACIN IN D5W 400 MG/200ML IV SOLN
400.0000 mg | Freq: Two times a day (BID) | INTRAVENOUS | Status: DC
  Administered 2019-09-20 – 2019-09-21 (×3): 400 mg via INTRAVENOUS
  Filled 2019-09-20 (×3): qty 200

## 2019-09-20 NOTE — Progress Notes (Addendum)
Palliative Care consult received for "end of life". Patient appears to have made significant improvement over the course of the day. He is no longer confused and close to his baseline health. Decline felt to be related to medication side effect (Ambien). Patient has a DNR order on his chart, he would benefit from MOST form completion at the time of discharge or schedule with his PCP for completion. Given his dx of heart failure and multiple myeloma he would be a good candidate for community based palliative care referral and support. Please consider placing the AMB referral to Palliative Care in the discharge orders.  Palliative RN documented verbal request for recs on leg and foot pain- Would replete magnesium which can cause leg cramping and pain, from chart review he has never had any imaging of his leg or foot except for a venous doppler study which was negative for DVT back in Nov, he had a destructive lesion in his pelvis and a lesion at L1 which can refer significant pain into the legs and feet. No EMG or Arterial study. Given the concern for polypharmacy and medication side effects on mental status would consider referral to interventional pain specialist for evaluation- there may be medication sparing options for pain control. Can also try topical NSAID and Lidoderm patch to see if that helps.  Will sign off on inpatient consult request, if his condition deteriorates or changes and he needs inpatient consultation please re-consult or call [phone].  [name] Hacker, DO Palliative Medicine

## 2019-09-20 NOTE — Progress Notes (Signed)
Physical Therapy Treatment Patient Details Name: [name] [name] MRN: [id number] DOB: May 02, 1941 Today's Date: 09/20/2019    History of Present Illness Pt is a 79 y.o. male admitted 09/13/19 with abdominal pain; worked up for severe sepsis secondary to ischemic colitis. Course complicated by acute metabolic encephalopathy; question sundowning. Pt with afib s/p DCCV 1/6. PMH includes multiple myeloma with metastatic disease, HTN, CHF, afib on Eliquis, s/p TAVR.   PT Comments    Pt progressing well with mobility. Requires min-modA to stand depending on surface height; able to perform hallway ambulation with RW and intermittent min guard for balance. Wife present and supportive, very receptive to caregiver training to ensure pt and caregiver safety upon return home; gait belt provided. Continue to recommend HHPT services to maximize functional mobility and independence upon return home.    Follow Up Recommendations  Home health PT;Supervision/Assistance - 24 hour     Equipment Recommendations  3in1 (PT);Other (comment)(rollator)    Recommendations for Other Services       Precautions / Restrictions Precautions Precautions: Fall Restrictions Weight Bearing Restrictions: No    Mobility  Bed Mobility Overal bed mobility: Needs Assistance Bed Mobility: Rolling;Supine to Sit Rolling: Modified independent (Device/Increase time)   Supine to sit: Supervision     General bed mobility comments: Increased time and effort with use of bed rail, no physical assist required  Transfers Overall transfer level: Needs assistance Equipment used: Rolling walker (2 wheeled) Transfers: Sit to/from Stand Sit to Stand: Min assist;Mod assist         General transfer comment: MinA for trunk elevation standing from bed and recliner to RW; modA to stand from toilet with UE support pulling on grab bar. Wife present for transfer training, including use of gait belt and body  mechanics  Ambulation/Gait Ambulation/Gait assistance: Min guard Gait Distance (Feet): 250 Feet Assistive device: Rolling walker (2 wheeled) Gait Pattern/deviations: Step-through pattern;Decreased stride length   Gait velocity interpretation: 1.31 - 2.62 ft/sec, indicative of limited community ambulator General Gait Details: Initial short step-length/shuffle, pt able to increase step length with cues; mostly steady gait with RW, intermittent min guard for balance. Pt reporting increased knee fatigue towards end of walk, also c/o consistent pain somewhat improved with wearing shoes for support   Stairs             Wheelchair Mobility    Modified Rankin (Stroke Patients Only)       Balance Overall balance assessment: Needs assistance Sitting-balance support: Feet unsupported;Bilateral upper extremity supported Sitting balance-Leahy Scale: Fair     Standing balance support: Bilateral upper extremity supported;During functional activity Standing balance-Leahy Scale: Poor Standing balance comment: Reliant on UE support                            Cognition Arousal/Alertness: Awake/alert Behavior During Therapy: WFL for tasks assessed/performed Overall Cognitive Status: Within Functional Limits for tasks assessed                                 General Comments: WFL for simple tasks, not formally assessed; motivated to participate      Exercises      General Comments General comments (skin integrity, edema, etc.): Wife present and supportive, motivated to discuss caregiver training and DME needs for return home      Pertinent Vitals/Pain Pain Assessment: Faces Faces Pain Scale: Hurts little more Pain Location:  Bilateral feet Pain Descriptors / Indicators: Constant;Pins and needles Pain Intervention(s): Monitored during session    Home Living                      Prior Function            PT Goals (current goals can now be  found in the care plan section) Acute Rehab PT Goals Patient Stated Goal: to get back to independent level PT Goal Formulation: With patient/family Time For Goal Achievement: 09/29/19 Potential to Achieve Goals: Fair Progress towards PT goals: Progressing toward goals    Frequency    Min 3X/week      PT Plan Current plan remains appropriate    Co-evaluation              AM-PAC PT "6 Clicks" Mobility   Outcome Measure  Help needed turning from your back to your side while in a flat bed without using bedrails?: None Help needed moving from lying on your back to sitting on the side of a flat bed without using bedrails?: A Little Help needed moving to and from a bed to a chair (including a wheelchair)?: A Little Help needed standing up from a chair using your arms (e.g., wheelchair or bedside chair)?: A Little Help needed to walk in hospital room?: A Little Help needed climbing 3-5 steps with a railing? : A Little 6 Click Score: 19    End of Session Equipment Utilized During Treatment: Gait belt Activity Tolerance: Patient tolerated treatment well Patient left: in chair;with call bell/phone within reach;with family/visitor present Nurse Communication: Mobility status PT Visit Diagnosis: Muscle weakness (generalized) (M62.81);Adult, failure to thrive (R62.7)     Time: 1522-1606 PT Time Calculation (min) (ACUTE ONLY): 44 min  Charges:  $Therapeutic Exercise: 8-22 mins $Therapeutic Activity: 8-22 mins $Self Care/Home Management: 8-22                    Mabeline Caras, PT, DPT Acute Rehabilitation Services  Pager [phone] Office [place] 09/20/2019, 5:29 PM

## 2019-09-20 NOTE — Progress Notes (Addendum)
Patient ID: [name] [name], male   DOB: 1941/01/17, 79 y.o.   MRN: [id number]     Advanced Heart Failure Rounding Note  PCP-Cardiologist: No primary care provider on file.   Subjective:    Sitting up in bed eating breakfast. Maintaining NSR on tele. HR in the 70s. No cardiac complaints this am. Main complaint is leg pain.   Objective:   Weight Range: 75.4 kg Body mass index is 23.18 kg/m.   Vital Signs:   Temp:  [97.3 F (36.3 C)-97.7 F (36.5 C)] 97.7 F (36.5 C) (01/06 1401) Pulse Rate:  [72-117] 72 (01/07 0459) Resp:  [18-22] 20 (01/07 0459) BP: (121-150)/(71-99) 150/83 (01/07 0459) SpO2:  [96 %-99 %] 98 % (01/07 0459) Weight:  [75.2 kg-75.4 kg] 75.4 kg (01/07 0459) Last BM Date: 09/19/19  Weight change: Filed Weights   09/19/19 0505 09/19/19 1245 09/20/19 0459  Weight: 75.2 kg 75.2 kg 75.4 kg    Intake/Output:   Intake/Output Summary (Last 24 hours) at 09/20/2019 0829 Last data filed at 09/20/2019 4599 Gross per 24 hour  Intake 1877.87 ml  Output 975 ml  Net 902.87 ml      Physical Exam    General:  Pleasant elderly male. No resp difficulty HEENT: normal Neck: supple. no JVD. Carotids 2+ bilat; no bruits. No lymphadenopathy or thryomegaly appreciated. Cor: PMI nondisplaced. Tach irregular rate & rhythm. No rubs, gallops or murmurs. Lungs: clear Abdomen: soft, nontender, nondistended. No hepatosplenomegaly. No bruits or masses. Good bowel sounds. Extremities: no cyanosis, clubbing, rash, edema Neuro: alert & orientedx3, cranial nerves grossly intact. moves all 4 extremities w/o difficulty. Affect pleasant   Telemetry    NSR 70s personally reviewed.   Labs    CBC Recent Labs    09/19/19 0458 09/20/19 0543  WBC 8.5 7.4  HGB 12.8* 11.5*  HCT 37.0* 33.5*  MCV 87.5 88.2  PLT 129* 774*   Basic Metabolic Panel Recent Labs    09/19/19 0458 09/19/19 1515 09/20/19 0543  NA 140  --  141  K 3.1*  --  3.7  CL 109  --  108  CO2 19*  --  21*  GLUCOSE  100*  --  95  BUN 5*  --  7*  CREATININE 0.80  --  0.73  CALCIUM 7.8*  --  7.8*  MG  --  1.6* 1.6*   Liver Function Tests No results for input(s): AST, ALT, ALKPHOS, BILITOT, PROT, ALBUMIN in the last 72 hours. No results for input(s): LIPASE, AMYLASE in the last 72 hours. Cardiac Enzymes No results for input(s): CKTOTAL, CKMB, CKMBINDEX, TROPONINI in the last 72 hours.  BNP: BNP (last 3 results) Recent Labs    05/04/19 1620 06/11/19 1235 07/30/19 1028  BNP 1,279.6* 508.3* 126.5*    ProBNP (last 3 results) No results for input(s): PROBNP in the last 8760 hours.   D-Dimer No results for input(s): DDIMER in the last 72 hours. Hemoglobin A1C No results for input(s): HGBA1C in the last 72 hours. Fasting Lipid Panel No results for input(s): CHOL, HDL, LDLCALC, TRIG, CHOLHDL, LDLDIRECT in the last 72 hours. Thyroid Function Tests No results for input(s): TSH, T4TOTAL, T3FREE, THYROIDAB in the last 72 hours.  Invalid input(s): FREET3  Other results:   Imaging    ECHOCARDIOGRAM LIMITED  Result Date: 09/19/2019   ECHOCARDIOGRAM LIMITED REPORT   Patient Name:   [name] [name] Date of Exam: 09/19/2019 Medical Rec #:  [id number]     Height:  66.0 in Accession #:    [id number]    Weight:       165.8 lb Date of Birth:  September 19, 1940     BSA:          1.85 m Patient Age:    34 years      BP:           135/96 mmHg Patient Gender: M             HR:           104 bpm. Exam Location:  Inpatient  Procedure: 3D Echo, Color Doppler and Cardiac Doppler Indications:    CHF-Acute Diastolic 865.78 / I69.62  History:        Patient has prior history of Echocardiogram examinations, most                 recent 05/30/2019. Aortic Valve: A Two 81m Edwards Sapien                 bioprosthetic, stented aortic valve (TAVR) Procedure Date:                 05/08/2019 Chronic kidney disease. Multiple myeloma.  Sonographer:    TDarlina SicilianRDCS Referring Phys: 3[place] 1. Left  ventricular ejection fraction, by visual estimation, is 60 to 65%. The left ventricle has normal function. There is mildly increased left ventricular wall thickness.  2. Global right ventricle has normal systolic function.The right ventricular size is normal.  3. Small to moderate pericardial effusion, measures up to 1.0cm adjacent to RV free wall  4. The inferior vena cava is normal in size with greater than 50% respiratory variability, suggesting right atrial pressure of 3 mmHg.  5. S/p bioprosthetic AVR. No aortic regurgitation. Bioprosthesis functioning normally. MG 10 mmHg, Vmax 2.3 m/s, DI 0.52  6. Compared to prior TTE on 05/30/19, gradient across bioprosthetic aortic valve is decreased FINDINGS  Left Ventricle: Left ventricular ejection fraction, by visual estimation, is 60 to 65%. The left ventricle has normal function. There is mildly increased left ventricular wall thickness. Left ventricular diastolic parameters are indeterminate. Right Ventricle: The right ventricular size is normal. No increase in right ventricular wall thickness. Global RV systolic function is has normal systolic function. Pericardium: A small pericardial effusion is present is seen. A small pericardial effusion is present. Mitral Valve: The mitral valve is normal in structure. No evidence of mitral valve regurgitation. Tricuspid Valve: The tricuspid valve is normal in structure. Tricuspid valve regurgitation is not demonstrated. Aortic Valve: Aortic valve regurgitation is not visualized. Aortic valve mean gradient measures 8.5 mmHg. Aortic valve peak gradient measures 17.1 mmHg. Aortic valve area, by VTI measures 1.80 cm. Two 22mEdwards Sapien bioprosthetic, stented aortic valve (TAVR) valve is present in the aortic position. Procedure Date: 05/08/2019. Pulmonic Valve: The pulmonic valve was not well visualized. Pulmonic valve regurgitation is not visualized by color flow Doppler. Pulmonic regurgitation is not visualized by color  flow Doppler. Aorta: The aortic root is normal in size and structure. Venous: The inferior vena cava is normal in size with greater than 50% respiratory variability, suggesting right atrial pressure of 3 mmHg.  LEFT VENTRICLE          Normals PLAX 2D LVIDd:         3.90 cm  3.6 cm LVIDs:         2.90 cm  1.7 cm LV PW:  1.10 cm  1.4 cm LV IVS:        1.40 cm  1.3 cm LVOT diam:     2.00 cm  2.0 cm LV SV:         34 ml    79 ml LV SV Index:   17.86    45 ml/m2 LVOT Area:     3.14 cm 3.14 cm2  LEFT ATRIUM         Index LA diam:    3.50 cm 1.90 cm/m  AORTIC VALVE                    Normals AV Area (Vmax):    1.38 cm AV Area (Vmean):   1.65 cm     3.06 cm2 AV Area (VTI):     1.80 cm AV Vmax:           206.75 cm/s AV Vmean:          134.250 cm/s 77 cm/s AV VTI:            0.271 m      3.15 cm2 AV Peak Grad:      17.1 mmHg AV Mean Grad:      8.5 mmHg     3 mmHg LVOT Vmax:         90.80 cm/s LVOT Vmean:        70.700 cm/s  75 cm/s LVOT VTI:          0.156 m      25.3 cm LVOT/AV VTI ratio: 0.57         1  AORTA                 Normals Ao Root diam: 2.50 cm 31 mm  SHUNTS Systemic VTI:  0.16 m Systemic Diam: 2.00 cm  Oswaldo Milian MD Electronically signed by Oswaldo Milian MD Signature Date/Time: 09/19/2019/3:26:18 PMThe mitral valve is normal in structure.    Final      Medications:     Scheduled Medications: . acyclovir  400 mg Oral BID  . apixaban  5 mg Oral BID  . dexamethasone  12 mg Oral Q Mon  . DULoxetine  60 mg Oral Daily  . feeding supplement (ENSURE ENLIVE)  237 mL Oral TID BM  . fentaNYL  1 patch Transdermal Q72H  . gabapentin  100 mg Oral q morning - 10a   And  . gabapentin  200 mg Oral QHS  . multivitamin with minerals  1 tablet Oral Daily  . sodium chloride flush  3 mL Intravenous Q12H    Infusions: . amiodarone 60 mg/hr (09/20/19 0552)  . ciprofloxacin    . metronidazole    . sodium chloride      PRN Medications: acetaminophen **OR** acetaminophen, diclofenac  Sodium, fentaNYL (SUBLIMAZE) injection, haloperidol lactate, LORazepam, ondansetron **OR** ondansetron (ZOFRAN) IV, oxyCODONE, sodium chloride   Assessment/Plan   1. Shock: Resolved. Possible cardiogenic due to atrial fibrillation with RVR and associated with ischemic colitis (abdominal symptoms, high lactate).  - EF 60-65% on echo  2. GI: Abdominal pain with diarrhea at admission associated with elevated lactate, PCT 40.  Concern for ischemic colitis in setting of afib/RVR. Afebrile, remains on cefepime.  Abdominal exam benign today.  Stable hgb and no overt GI bleeding.  3. Atrial fibrillation: History PAF.  Has traditionally tolerated atrial fibrillation poorly.  S/p DCCV 1/6. Maintaining NSR on tele. HR 70s.  - Continue IV amiodarone load and plan conversion  to PO - Continue  apixaban 5 mg po bid .  4. Bicuspid aortic valve disorder: Ascending aorta was 4.3 cm on recent coronary CTA.He is s/p TAVR with 2 Edwards Sapien THVs (valve-in-valve due to peri-valvular leak after 1st valve placed). Post-op, mean gradient across the aortic valve has been elevated, most recently 26 mmHg in 9/20. Dimensionless index, most recently 0.54, only suggests mild bioprosthetic aortic stenosis. 2D Echo 09/19/19 demonstrates Bioprosthesis functioning normally. MG 10 mmHg, Vmax 2.3 m/s, DI 0.52 5. AKI: Resolved.  6. Chronic diastolic CHF: Echo in 8/41 with EF >65%.  He is not volume overloaded on exam.  - Volume status stable.  - EF normal by TTE 09/19/19, 60-65% 7. Foot pain: Severe neuropathic pain in feet, very distressing to him.   8. Multiple myeloma: Per Dr. Irene Limbo.  9. Neuro: Resolved. No longer confused. His wife is wondering if Lorrin Mais may contributed to confusion.    Length of Stay: [address] PA-C  09/20/2019, 8:29 AM  Advanced Heart Failure Team Pager [phone] (M-F; 7a - 4p)  Please contact [facility] for night-coverage after hours (4p -7a ) and weekends on [email or web address]  Patient seen with  PA, agree with the above note.   He remains in NSR today after DCCV yesterday. Still with bilateral foot pain, difficult to walk.   On exam, no JVD, no edema, regular S1S2 with 2/6 early SEM RUSB.   Transition from amiodarone gtt to amiodarone 200 mg bid x 10 days then 200 mg daily long-term.  Can resume Toprol XL 25 mg daily.  He is on apixaban.   Volume status looks ok though he feels like his feet are swelling.  Creatinine back to baseline.  - Can restart Lasix at 20 mg daily and spironolactone at 25 mg daily.   Needs ongoing work with PT, still limited by neuropathic pain in feet.   [name] 09/20/2019 1:11 PM

## 2019-09-20 NOTE — Progress Notes (Signed)
[facility] PROGRESS NOTE    Progress Note  [name] [name]  MWU:[id number] DOB: Sep 13, 1941 DOA: 09/13/2019 PCP: [name] Anger, MD     Brief Narrative:   [name] [name] is an 79 y.o. male past medical history paroxysmal atrial fibrillation on Eliquis, hypertension chronic diastolic heart failure status post TAVR's in August 2020, with multiple myeloma, where reports that he was diagnosed with bone: Severe on August 2020 followed by Dr. Irene [name] presents to the hospital complaining of abdominal pain bloody diarrhea, he was admitted for severe sepsis and abdominal pain etiology as well of encephalopathy with C. difficile negative, on 09/15/2018 the patient went into A. fib with RVR with evidence of hypotension cardiology was consulted was started on amiodarone drip and now has stabilized.  On 09/16/2018 he developed significant agitation and combativeness.  Assessment/Plan:   Severe sepsis secondary to ischemic colitis: C. difficile negative GI PCR was also negative. He was started empirically on IV vancomycin and cefepime, vancomycin was discontinued currently on cefepime will de-escalate antibiotic further as to Cipro and Flagyl.  Acute metabolic encephalopathy: Patient was obtunded at the time of admission, since then he has had intermittent episodes of combativeness and confusion question sundowning.  A. fib with RVR: Cardiology was consulted, she is currently on Eliquis and amiodarone drip. Status post cardioversion 09/18/2018. Further management per cardiology.  Acute kidney injury: With a baseline creatinine of 0.8, on admission 2.2 resolved after IV fluid hydration likely prerenal azotemia.  Multiple myeloma: She is currently on Zometa, continue acyclovir and Decadron and Revlimid.  Peripheral neuropathy likely due to chemotherapy: Continue Neurontin and Cymbalta and oxycodone.  Chronic diastolic heart failure: Appears to be euvolemic, Lasix and Aldactone were held on  admission due to AKI.  Bicuspid aortic valve: Status post CABG in August 2020  RN Pressure Injury Documentation: Pressure Injury 09/14/19 Sacrum Anterior Stage 2 -  Partial thickness loss of dermis presenting as a shallow open injury with a red, pink wound bed without slough. (Active)  09/14/19 1621  Location: Sacrum  Location Orientation: Anterior  Staging: Stage 2 -  Partial thickness loss of dermis presenting as a shallow open injury with a red, pink wound bed without slough.  Wound Description (Comments):   Present on Admission: Yes    Estimated body mass index is 23.18 kg/m as calculated from the following:   Height as of this encounter: 5' 11" (1.803 m).   Weight as of this encounter: 75.4 kg. Malnutrition Type:  Nutrition Problem: Increased nutrient needs Etiology: cancer and cancer related treatments   Malnutrition Characteristics:  Signs/Symptoms: estimated needs   Nutrition Interventions:  Interventions: Ensure Enlive (each supplement provides 350kcal and 20 grams of protein), MVI    DVT prophylaxis: lovenox Family Communication:none Disposition Plan/Barrier to D/C: unable to deteremine Code Status:     Code Status Orders  (From admission, onward)         Start     Ordered   09/14/19 0852  Do not attempt resuscitation (DNR)  Continuous    Question Answer Comment  In the event of cardiac or respiratory ARREST Do not call a "code blue"   In the event of cardiac or respiratory ARREST Do not perform Intubation, CPR, defibrillation or ACLS   In the event of cardiac or respiratory ARREST Use medication by any route, position, wound care, and other measures to relive pain and suffering. May use oxygen, suction and manual treatment of airway obstruction as needed for comfort.  09/14/19 0854        Code Status History    Date Active Date Inactive Code Status Order ID Comments User Context   08/20/2019 0808 08/20/2019 1201 Full Code [id number]  Larey Dresser, MD Inpatient   05/08/2019 1309 05/11/2019 1532 Full Code [id number]  Crista Luria Inpatient   03/07/2019 1013 03/07/2019 1536 Full Code [id number]  Burnell Blanks, MD Inpatient   07/21/2018 1115 07/26/2018 1733 Full Code [id number]  Guilford Shi, MD ED   03/02/2018 1201 03/03/2018 1758 Full Code [id number]  Patsey Berthold, NP Inpatient   02/28/2018 1245 03/02/2018 1201 Full Code [id number]  Samella Parr, NP ED   10/27/2017 2049 10/28/2017 1837 Full Code [id number]  Shirley Friar, PA-C ED   10/18/2016 1741 10/28/2016 1946 Full Code [id number]  Bethena Roys, MD Inpatient   08/02/2016 1726 08/03/2016 2041 Full Code [id number]  Charlie Pitter, PA-C Inpatient   Advance Care Planning Activity    Advance Directive Documentation     Most Recent Value  Type of Advance Directive  Living will  Pre-existing out of facility DNR order (yellow form or pink MOST form)  --  "MOST" Form in Place?  --        IV Access:    Peripheral IV   Procedures and diagnostic studies:   ECHOCARDIOGRAM LIMITED  Result Date: 09/19/2019   ECHOCARDIOGRAM LIMITED REPORT   Patient Name:   [name] [name] Date of Exam: 09/19/2019 Medical Rec #:  [id number]     Height:       66.0 in Accession #:    [id number]    Weight:       165.8 lb Date of Birth:  1941-01-02     BSA:          1.85 m Patient Age:    89 years      BP:           135/96 mmHg Patient Gender: M             HR:           104 bpm. Exam Location:  Inpatient  Procedure: 3D Echo, Color Doppler and Cardiac Doppler Indications:    CHF-Acute Diastolic 945.03 / U88.28  History:        Patient has prior history of Echocardiogram examinations, most                 recent 05/30/2019. Aortic Valve: A Two 72m Edwards Sapien                 bioprosthetic, stented aortic valve (TAVR) Procedure Date:                 05/08/2019 Chronic kidney disease. Multiple myeloma.  Sonographer:    TDarlina SicilianRDCS Referring Phys: 3[place] 1. Left ventricular ejection fraction, by visual estimation, is 60 to 65%. The left ventricle has normal function. There is mildly increased left ventricular wall thickness.  2. Global right ventricle has normal systolic function.The right ventricular size is normal.  3. Small to moderate pericardial effusion, measures up to 1.0cm adjacent to RV free wall  4. The inferior vena cava is normal in size with greater than 50% respiratory variability, suggesting right atrial pressure of 3 mmHg.  5. S/p bioprosthetic AVR. No aortic regurgitation. Bioprosthesis functioning normally. MG 10 mmHg, Vmax 2.3 m/s, DI 0.52  6. Compared to prior TTE on 05/30/19, gradient across bioprosthetic  aortic valve is decreased FINDINGS  Left Ventricle: Left ventricular ejection fraction, by visual estimation, is 60 to 65%. The left ventricle has normal function. There is mildly increased left ventricular wall thickness. Left ventricular diastolic parameters are indeterminate. Right Ventricle: The right ventricular size is normal. No increase in right ventricular wall thickness. Global RV systolic function is has normal systolic function. Pericardium: A small pericardial effusion is present is seen. A small pericardial effusion is present. Mitral Valve: The mitral valve is normal in structure. No evidence of mitral valve regurgitation. Tricuspid Valve: The tricuspid valve is normal in structure. Tricuspid valve regurgitation is not demonstrated. Aortic Valve: Aortic valve regurgitation is not visualized. Aortic valve mean gradient measures 8.5 mmHg. Aortic valve peak gradient measures 17.1 mmHg. Aortic valve area, by VTI measures 1.80 cm. Two 57m Edwards Sapien bioprosthetic, stented aortic valve (TAVR) valve is present in the aortic position. Procedure Date: 05/08/2019. Pulmonic Valve: The pulmonic valve was not well visualized. Pulmonic valve regurgitation is not visualized by color flow Doppler. Pulmonic regurgitation is not  visualized by color flow Doppler. Aorta: The aortic root is normal in size and structure. Venous: The inferior vena cava is normal in size with greater than 50% respiratory variability, suggesting right atrial pressure of 3 mmHg.  LEFT VENTRICLE          Normals PLAX 2D LVIDd:         3.90 cm  3.6 cm LVIDs:         2.90 cm  1.7 cm LV PW:         1.10 cm  1.4 cm LV IVS:        1.40 cm  1.3 cm LVOT diam:     2.00 cm  2.0 cm LV SV:         34 ml    79 ml LV SV Index:   17.86    45 ml/m2 LVOT Area:     3.14 cm 3.14 cm2  LEFT ATRIUM         Index LA diam:    3.50 cm 1.90 cm/m  AORTIC VALVE                    Normals AV Area (Vmax):    1.38 cm AV Area (Vmean):   1.65 cm     3.06 cm2 AV Area (VTI):     1.80 cm AV Vmax:           206.75 cm/s AV Vmean:          134.250 cm/s 77 cm/s AV VTI:            0.271 m      3.15 cm2 AV Peak Grad:      17.1 mmHg AV Mean Grad:      8.5 mmHg     3 mmHg LVOT Vmax:         90.80 cm/s LVOT Vmean:        70.700 cm/s  75 cm/s LVOT VTI:          0.156 m      25.3 cm LVOT/AV VTI ratio: 0.57         1  AORTA                 Normals Ao Root diam: 2.50 cm 31 mm  SHUNTS Systemic VTI:  0.16 m Systemic Diam: 2.00 cm  COswaldo MilianMD Electronically signed by COswaldo MilianMD Signature Date/Time: 09/19/2019/3:26:18 PMThe  mitral valve is normal in structure.    Final      Medical Consultants:    None.  Anti-Infectives:   Cipro and Flagyl  Subjective:    [name] [name] no complaints today.  Objective:    Vitals:   09/19/19 1636 09/19/19 2051 09/20/19 0122 09/20/19 0459  BP: (!) 147/86 138/82 (!) 146/86 (!) 150/83  Pulse: 73 75 75 72  Resp: _0 Temp:      TempSrc:      SpO2: 99% 98% 96% 98%  Weight:    75.4 kg  Height:       SpO2: 98 % O2 Flow Rate (L/min): 2 L/min   Intake/Output Summary (Last 24 hours) at 09/20/2019 0818 Last data filed at 09/20/2019 0512 Gross per 24 hour  Intake 1877.87 ml  Output 975 ml  Net 902.87 ml   Filed Weights    09/19/19 0505 09/19/19 1245 09/20/19 0459  Weight: 75.2 kg 75.2 kg 75.4 kg    Exam: General exam: In no acute distress. Respiratory system: Good air movement and clear to auscultation. Cardiovascular system: S1 & S2 heard, RRR. No JVD. Gastrointestinal system: Abdomen is nondistended, soft and nontender.  Central nervous system: Alert and oriented. No focal neurological deficits. Extremities: No pedal edema. Skin: No rashes, lesions or ulcers Psychiatry: Judgement and insight appear normal. Mood & affect appropriate.   Data Reviewed:    Labs: Basic Metabolic Panel: Recent Labs  Lab 09/16/19 0845 09/17/19 0704 09/18/19 0345 09/19/19 0458 09/19/19 1515 09/20/19 0543  NA 137 138 137 140  --  141  K 3.9 3.2* 3.6 3.1*  --  3.7  CL 109 108 105 109  --  108  CO2 19* 20* 17* 19*  --  21*  GLUCOSE 91 91 131* 100*  --  95  BUN _1 5*  --  7*  CREATININE 0.91 0.89 0.96 0.80  --  0.73  CALCIUM 7.8* 7.9* 7.9* 7.8*  --  7.8*  MG  --   --   --   --  1.6* 1.6*   GFR Estimated Creatinine Clearance: 79.7 mL/min (by C-G formula based on SCr of 0.73 mg/dL). Liver Function Tests: Recent Labs  Lab 09/13/19 2325 09/16/19 0845  AST 39 30  ALT 29 25  ALKPHOS 126 95  BILITOT 1.6* 0.7  PROT 7.4 4.3*  ALBUMIN 5.2* 2.7*   No results for input(s): LIPASE, AMYLASE in the last 168 hours. No results for input(s): AMMONIA in the last 168 hours. Coagulation profile Recent Labs  Lab 09/14/19 1027 09/19/19 0458  INR 1.5* 1.3*   COVID-19 Labs  No results for input(s): DDIMER, FERRITIN, LDH, CRP in the last 72 hours.  Lab Results  Component Value Date   SARSCOV2NAA NEGATIVE 09/14/2019   SARSCOV2NAA NOT DETECTED 08/16/2019   [place] NEGATIVE 05/04/2019   [place] NEGATIVE 03/03/2019    CBC: Recent Labs  Lab 09/13/19 2325 09/16/19 0845 09/17/19 0704 09/18/19 0345 09/19/19 0458 09/20/19 0543  WBC 6.1 7.5 7.8 6.9 8.5 7.4  NEUTROABS 5.1  --   --   --   --   --   HGB  16.9 11.7* 11.8* 12.3* 12.8* 11.5*  HCT 50.8 35.3* 35.3* 36.6* 37.0* 33.5*  MCV 91.4 92.4 89.8 90.1 87.5 88.2  PLT 173 80* 86* 106* 129* 144*   Cardiac Enzymes: No results for input(s): CKTOTAL, CKMB, CKMBINDEX, TROPONINI in the last 168 hours. BNP (last 3 results) No results for input(s): PROBNP  in the last 8760 hours. CBG: Recent Labs  Lab 09/19/19 0629 09/19/19 1137 09/19/19 1655 09/19/19 2116 09/20/19 0649  GLUCAP 103* 107* 87 103* 94   D-Dimer: No results for input(s): DDIMER in the last 72 hours. Hgb A1c: No results for input(s): HGBA1C in the last 72 hours. Lipid Profile: No results for input(s): CHOL, HDL, LDLCALC, TRIG, CHOLHDL, LDLDIRECT in the last 72 hours. Thyroid function studies: No results for input(s): TSH, T4TOTAL, T3FREE, THYROIDAB in the last 72 hours.  Invalid input(s): FREET3 Anemia work up: No results for input(s): VITAMINB12, FOLATE, FERRITIN, TIBC, IRON, RETICCTPCT in the last 72 hours. Sepsis Labs: Recent Labs  Lab 09/13/19 2325 09/14/19 0609 09/14/19 1027 09/14/19 1615 09/17/19 0704 09/18/19 0345 09/19/19 0458 09/20/19 0543  PROCALCITON  --   --  48.04  --   --   --   --   --   WBC 6.1  --   --  15.4* 7.8 6.9 8.5 7.4  LATICACIDVEN 6.1* 3.5* 1.7 1.5  --   --   --   --    Microbiology Recent Results (from the past 240 hour(s))  GI pathogen panel by PCR, stool     Status: None   Collection Time: 09/14/19  1:00 AM   Specimen: Stool  Result Value Ref Range Status   Plesiomonas shigelloides NOT DETECTED NOT DETECTED Final   Yersinia enterocolitica NOT DETECTED NOT DETECTED Final   Vibrio NOT DETECTED NOT DETECTED Final   Enteropathogenic E coli NOT DETECTED NOT DETECTED Final   E coli (ETEC) LT/ST NOT DETECTED NOT DETECTED Final   E coli 5537 by PCR Not applicable NOT DETECTED Final   Cryptosporidium by PCR NOT DETECTED NOT DETECTED Final   Entamoeba histolytica NOT DETECTED NOT DETECTED Final   Adenovirus F 40/41 NOT DETECTED NOT  DETECTED Final   Norovirus GI/GII NOT DETECTED NOT DETECTED Final   Sapovirus NOT DETECTED NOT DETECTED Final    Comment: (NOTE) Performed At: [name] [place], [place] [id number] Rush Farmer MD JQ:[id number]    Vibrio cholerae NOT DETECTED NOT DETECTED Final   Campylobacter by PCR NOT DETECTED NOT DETECTED Final   Salmonella by PCR NOT DETECTED NOT DETECTED Final   E coli (STEC) NOT DETECTED NOT DETECTED Final   Enteroaggregative E coli NOT DETECTED NOT DETECTED Final   Shigella by PCR NOT DETECTED NOT DETECTED Final   Cyclospora cayetanensis NOT DETECTED NOT DETECTED Final   Astrovirus NOT DETECTED NOT DETECTED Final   G lamblia by PCR NOT DETECTED NOT DETECTED Final   Rotavirus A by PCR NOT DETECTED NOT DETECTED Final  C difficile quick scan w PCR reflex     Status: None   Collection Time: 09/14/19  1:00 AM   Specimen: Stool  Result Value Ref Range Status   C Diff antigen NEGATIVE NEGATIVE Final   C Diff toxin NEGATIVE NEGATIVE Final   C Diff interpretation No C. difficile detected.  Final    Comment: Performed at [facility], [place] [address], [place], [place] [address]  Respiratory Panel by RT PCR (Flu A&B, Covid) - Nasopharyngeal Swab     Status: None   Collection Time: 09/14/19  1:16 AM   Specimen: Nasopharyngeal Swab  Result Value Ref Range Status   SARS Coronavirus 2 by RT PCR NEGATIVE NEGATIVE Final    Comment: (NOTE) SARS-CoV-2 target nucleic acids are NOT DETECTED. The SARS-CoV-2 RNA is generally detectable in upper respiratoy specimens during the acute  phase of infection. The lowest concentration of SARS-CoV-2 viral copies this assay can detect is 131 copies/mL. A negative result does not preclude SARS-Cov-2 infection and should not be used as the sole basis for treatment or other patient management decisions. A negative result may occur with  improper specimen collection/handling, submission of specimen other than nasopharyngeal  swab, presence of viral mutation(s) within the areas targeted by this assay, and inadequate number of viral copies (<131 copies/mL). A negative result must be combined with clinical observations, patient history, and epidemiological information. The expected result is Negative. Fact Sheet for Patients:  PinkCheek.be Fact Sheet for Healthcare Providers:  GravelBags.it This test is not yet ap proved or cleared by the [place] FDA and  has been authorized for detection and/or diagnosis of SARS-CoV-2 by FDA under an Emergency Use Authorization (EUA). This EUA will remain  in effect (meaning this test can be used) for the duration of the COVID-19 declaration under Section 564(b)(1) of the Act, 21 U.S.C. section 360bbb-3(b)(1), unless the authorization is terminated or revoked sooner.    Influenza A by PCR NEGATIVE NEGATIVE Final   Influenza B by PCR NEGATIVE NEGATIVE Final    Comment: (NOTE) The Xpert Xpress SARS-CoV-2/FLU/RSV assay is intended as an aid in  the diagnosis of influenza from Nasopharyngeal swab specimens and  should not be used as a sole basis for treatment. Nasal washings and  aspirates are unacceptable for Xpert Xpress SARS-CoV-2/FLU/RSV  testing. Fact Sheet for Patients: PinkCheek.be Fact Sheet for Healthcare Providers: GravelBags.it This test is not yet approved or cleared by the [place] FDA and  has been authorized for detection and/or diagnosis of SARS-CoV-2 by  FDA under an Emergency Use Authorization (EUA). This EUA will remain  in effect (meaning this test can be used) for the duration of the  Covid-19 declaration under Section 564(b)(1) of the Act, 21  U.S.C. section 360bbb-3(b)(1), unless the authorization is  terminated or revoked. Performed at [facility], [place] [address] [place], [place] [address]   Culture, Urine      Status: None   Collection Time: 09/15/19 11:36 AM   Specimen: Urine, Clean Catch  Result Value Ref Range Status   Specimen Description URINE, CLEAN CATCH  Final   Special Requests NONE  Final   Culture   Final    NO GROWTH Performed at [facility], [place] [address] [place], [place] [address]    Report Status 09/16/2019 FINAL  Final     Medications:   . acyclovir  400 mg Oral BID  . apixaban  5 mg Oral BID  . dexamethasone  12 mg Oral Q Mon  . DULoxetine  60 mg Oral Daily  . feeding supplement (ENSURE ENLIVE)  237 mL Oral TID BM  . fentaNYL  1 patch Transdermal Q72H  . gabapentin  100 mg Oral q morning - 10a   And  . gabapentin  200 mg Oral QHS  . multivitamin with minerals  1 tablet Oral Daily  . sodium chloride flush  3 mL Intravenous Q12H   Continuous Infusions: . amiodarone 60 mg/hr (09/20/19 0552)  . ceFEPime (MAXIPIME) IV 2 g (09/19/19 2308)  . sodium chloride        LOS: 6 days   Charlynne Cousins  [facility]  09/20/2019, 8:18 AM

## 2019-09-20 NOTE — Progress Notes (Signed)
Occupational Therapy Treatment Patient Details Name: [name] [name] MRN: [id number] DOB: [birth year] Today's Date: 09/20/2019    History of present illness [name] [name] is a 79 y.o. male with medical history significant of afib on Eliquis; HTN; HLD; chronic diastolic CHF; s/p TAVR; and multiple myeloma with metastatic disease. Patient presented secondary to abdominal pain with concern for sepsis on admission.   OT comments  Pt progressing well toward stated goals, HR stable this date. In supine HR was 74 bpm and 85 bpm after transfer. Pt completed bed mobility at min A level for BLE management 2/2 weakness and pain. While seated EOB pt completed UB bathing with wife assisting at min A level. He then completed sit <> stand wit mod A and use of RW to pivot over to chair. Pt fatigued after this, presenting with decreased activity tolerance. Wife present during session and very supportive and caring. Continue to recommend HHOT at d/c. Pt may also benefit from w/c for longer distances. Will continue to follow.   Follow Up Recommendations  Home health OT;Supervision/Assistance - 24 hour    Equipment Recommendations  3 in 1 bedside commode;Wheelchair (measurements OT);Wheelchair cushion (measurements OT)    Recommendations for Other Services      Precautions / Restrictions Precautions Precautions: Fall Precaution Comments: watch HR Restrictions Weight Bearing Restrictions: No       Mobility Bed Mobility Overal bed mobility: Needs Assistance Bed Mobility: Supine to Sit     Supine to sit: Min assist     General bed mobility comments: min A for BLE management  Transfers Overall transfer level: Needs assistance Equipment used: Rolling walker (2 wheeled) Transfers: Sit to/from Stand Sit to Stand: Mod assist         General transfer comment: mod A to rise and steady, cues for safe hand placement    Balance Overall balance assessment: Needs assistance Sitting-balance support: Feet  unsupported;Bilateral upper extremity supported Sitting balance-[name] [name] Scale: Fair Sitting balance - Comments: able to maintain static balance while wife washed back   Standing balance support: Bilateral upper extremity supported;During functional activity Standing balance-[name] [name] Scale: Poor Standing balance comment: reliant on therapist and RW for external support                           ADL either performed or assessed with clinical judgement   ADL Overall ADL's : Needs assistance/impaired         Upper Body Bathing: Minimal assistance;Sitting Upper Body Bathing Details (indicate cue type and reason): wife assisting for back             Toilet Transfer: Moderate assistance;Stand-pivot;RW Toilet Transfer Details (indicate cue type and reason): simulated with chair transfer, mod A needed to rise and steady. Improves with use of hand rails, grab bars Toileting- Clothing Manipulation and Hygiene: Moderate assistance;Sit to/from stand   Tub/ Banker: Moderate assistance;Ambulation;Shower seat;Rolling walker   Functional mobility during ADLs: Moderate assistance;Minimal assistance;Cueing for sequencing;Rolling walker General ADL Comments: pt HR stable this date, able to tolerate OOB transfer at this time. Continues to be limited by generalized weakness and poor activity tolerance     Vision Baseline Vision/History: Wears glasses Wears Glasses: Distance only Vision Assessment?: No apparent visual deficits   Perception     Praxis      Cognition Arousal/Alertness: Awake/alert Behavior During Therapy: WFL for tasks assessed/performed Overall Cognitive Status: Within Functional Limits for tasks assessed  General Comments: in better spirits this date, was disappointed in self with weakness- improved with wife encouragement        Exercises     Shoulder Instructions       General Comments      Pertinent  Vitals/ Pain       Pain Assessment: Faces Faces Pain Scale: Hurts a little bit Pain Location: "everywhere" Pain Descriptors / Indicators: Aching;Nagging;Discomfort Pain Intervention(s): Limited activity within patient's tolerance;Monitored during session;Repositioned  Home Living                                          Prior Functioning/Environment              Frequency  Min 2X/week        Progress Toward Goals  OT Goals(current goals can now be found in the care plan section)  Progress towards OT goals: Progressing toward goals  Acute Rehab OT Goals Patient Stated Goal: to get back to independent level OT Goal Formulation: With patient/family Time For Goal Achievement: 10/02/19 Potential to Achieve Goals: Good  Plan Discharge plan needs to be updated    Co-evaluation                 AM-PAC OT "6 Clicks" Daily Activity     Outcome Measure   Help from another person eating meals?: A Little Help from another person taking care of personal grooming?: A Little Help from another person toileting, which includes using toliet, bedpan, or urinal?: A Lot Help from another person bathing (including washing, rinsing, drying)?: A Lot Help from another person to put on and taking off regular upper body clothing?: A Little Help from another person to put on and taking off regular lower body clothing?: A Lot 6 Click Score: 15    End of Session Equipment Utilized During Treatment: Rolling walker;Gait belt  OT Visit Diagnosis: Other abnormalities of gait and mobility (R26.89);Muscle weakness (generalized) (M62.81);Pain Pain - Right/Left: (B) Pain - part of body: Leg   Activity Tolerance Patient tolerated treatment well   Patient Left in chair;with call bell/phone within reach;with chair alarm set;with family/visitor present   Nurse Communication Mobility status;Other (comment)(HR Stable)        Time: 6568-1275 OT Time Calculation (min): 33  min  Charges: OT General Charges $OT Visit: 1 Visit OT Treatments $Self Care/Home Management : 23-37 mins  Zenovia Jarred, MSOT, OTR/L Acute Rehabilitation Services [facility] Office Number: [phone]  Zenovia Jarred 09/20/2019, 11:18 AM

## 2019-09-20 NOTE — TOC Initial Note (Signed)
Transition of Care [facility]) - Initial/Assessment Note    Patient Details  Name: [name] [name] MRN: [id number] Date of Birth: [birth year]  Transition of Care [facility]) CM/SW Contact:    Zenon Mayo, RN Phone Number: 09/20/2019, 10:28 AM  Clinical Narrative:                 NCM spoke with wife who is at bedside with patient, NCM informed her patient was set up at MD office and asked if they want to continue with [facility], she states yes.  NCM informed Hoyle Sauer that patient may be for possible dc today. Soc will begin 24 to 48 hrs post dc.  Expected Discharge Plan: [name] [name] Barriers to Discharge: No Barriers Identified   Patient Goals and CMS Choice Patient states their goals for this hospitalization and ongoing recovery are:: get better CMS Medicare.gov Compare Post Acute Care list provided to:: Patient Represenative (must comment)(wife) Choice offered to / list presented to : Spouse  Expected Discharge Plan and Services Expected Discharge Plan: [place] In-house Referral: NA Discharge Planning Services: CM Consult Post Acute Care Choice: [place] arrangements for the past 2 months: Single Family Home                 DME Arranged: (NA)         HH Arranged: PT, OT HH Agency: ([place]) Date [place]: 09/20/19 Time [place]: 1027 Representative spoke with at [place]: [name] [name]  Prior Living Arrangements/Services Living arrangements for the past 2 months: [place] with:: Spouse Patient language and need for interpreter reviewed:: Yes Do you feel safe going back to the place where you live?: Yes      Need for Family Participation in Patient Care: Yes (Comment) Care giver support system in place?: Yes (comment)   Criminal Activity/Legal Involvement Pertinent to Current Situation/Hospitalization: No - Comment as needed  Activities of Daily Living Home Assistive  Devices/Equipment: Cane (specify quad or straight) ADL Screening (condition at time of admission) Patient's cognitive ability adequate to safely complete daily activities?: Yes Is the patient deaf or have difficulty hearing?: No Does the patient have difficulty seeing, even when wearing glasses/contacts?: No Does the patient have difficulty concentrating, remembering, or making decisions?: No Patient able to express need for assistance with ADLs?: Yes Does the patient have difficulty dressing or bathing?: No Independently performs ADLs?: Yes (appropriate for developmental age) Does the patient have difficulty walking or climbing stairs?: Yes Weakness of Legs: Both Weakness of Arms/Hands: None  Permission Sought/Granted                  Emotional Assessment Appearance:: Appears stated age Attitude/Demeanor/Rapport: Engaged Affect (typically observed): Appropriate Orientation: : Oriented to Self, Oriented to Place, Oriented to  Time, Oriented to Situation   Psych Involvement: No (comment)  Admission diagnosis:  Hypercalcemia [E83.52] Diarrhea [R19.7] Lactic acidosis [E87.2] Generalized abdominal pain [R10.84] Multiple myeloma not having achieved remission ([place]) [C90.00] Patient Active Problem List   Diagnosis Date Noted  . Ischemic colitis ([place]) 09/18/2019  . Pressure injury of skin 09/16/2019  . Bloody diarrhea 09/14/2019  . Abdominal pain, diffuse 09/14/2019  . Acute metabolic encephalopathy 87/86/7672  . Goals of care, counseling/discussion 09/14/2019  . DNR (do not resuscitate) 09/14/2019  . Peripheral neuropathy 09/13/2019  . Gait disorder 09/13/2019  . Multiple myeloma not having achieved remission ([place]) 05/17/2019  . Counseling regarding advance care planning  and goals of care 05/17/2019  . Bone metastases ([place]) 05/17/2019  . Postoperative fever 05/09/2019  . S/P TAVR (transcatheter aortic valve replacement) 05/08/2019  . Multiple myeloma ([place]) 05/08/2019  .  Acute on chronic diastolic heart failure ([place]) 05/08/2019  . Severe aortic stenosis   . Polymyalgia rheumatica syndrome ([place]) 01/29/2019  . CAD (coronary artery disease) 08/01/2018  . HTN (hypertension) 02/28/2018  . Hypercholesteremia 02/28/2018  . Mastoiditis 03/21/2017  . Abnormal TSH 12/23/2016  . Parapneumonic effusion   . Acute kidney injury superimposed on CKD ([place])   . Ascending aortic aneurysm ([place]) 02/15/2013  . Atrial fibrillation with RVR ([place]) 01/23/2013  . Well adult exam 05/31/2012  . PAF (paroxysmal atrial fibrillation) ([place]) 08/05/2008  . HLD (hyperlipidemia) 04/08/2007  . Essential hypertension 04/08/2007   PCP:  Cassandria Anger, MD Pharmacy:   [facility] [place], [place] - [address] [place] [address] Phone: [phone] Fax: [phone]  [id number] [place] [address] [address]Phone: [phone]: [phone] [facility] [place]- [address]Phone: [phone]: [phone] [facility] [place] - [address]Phone: [phone]: [phone]    Social Determinants of Health ([place] Interventions    Readmission Risk Interventions Readmission Risk Prevention Plan 09/20/2019  Transportation Screening Complete  Medication Review ([place] Complete  PCP or Specialist appointment within 3-5 days of discharge Complete  HRI or HArgentineComplete  SW Recovery Care/Counseling Consult Complete  PGreeleyNot Applicable  Some recent data might be hidden

## 2019-09-21 DIAGNOSIS — E872 Acidosis: Secondary | ICD-10-CM

## 2019-09-21 LAB — GLUCOSE, CAPILLARY: Glucose-Capillary: 90 mg/dL (ref 70–99)

## 2019-09-21 LAB — BASIC METABOLIC PANEL
Anion gap: 11 (ref 5–15)
BUN: 5 mg/dL — ABNORMAL LOW (ref 8–23)
CO2: 23 mmol/L (ref 22–32)
Calcium: 8.1 mg/dL — ABNORMAL LOW (ref 8.9–10.3)
Chloride: 103 mmol/L (ref 98–111)
Creatinine, Ser: 0.69 mg/dL (ref 0.61–1.24)
GFR calc Af Amer: >60 mL/min (ref >60)
GFR calc non Af Amer: >60 mL/min (ref >60)
Glucose, Bld: 93 mg/dL (ref 70–99)
Potassium: 3.1 mmol/L — ABNORMAL LOW (ref 3.5–5.1)
Sodium: 137 mmol/L (ref 135–145)

## 2019-09-21 LAB — MAGNESIUM: Magnesium: 1.7 mg/dL (ref 1.7–2.4)

## 2019-09-21 MED ORDER — METRONIDAZOLE 500 MG PO TABS: 500.0000 mg | ORAL_TABLET | Freq: Three times a day (TID) | ORAL | 0 refills | Status: AC

## 2019-09-21 MED ORDER — AMLODIPINE BESYLATE 5 MG PO TABS: 5.0000 mg | ORAL_TABLET | Freq: Every day | ORAL | 3 refills | Status: DC

## 2019-09-21 MED ORDER — FUROSEMIDE 20 MG PO TABS: 20.0000 mg | ORAL_TABLET | Freq: Every day | ORAL | 3 refills | Status: DC

## 2019-09-21 MED ORDER — POTASSIUM CHLORIDE CRYS ER 20 MEQ PO TBCR
40.0000 meq | EXTENDED_RELEASE_TABLET | Freq: Once | ORAL | Status: AC
  Administered 2019-09-21: 40 meq via ORAL
  Filled 2019-09-21: qty 2

## 2019-09-21 MED ORDER — SPIRONOLACTONE 25 MG PO TABS: 25.0000 mg | ORAL_TABLET | Freq: Every day | ORAL | 3 refills | Status: DC

## 2019-09-21 MED ORDER — AMLODIPINE BESYLATE 5 MG PO TABS: 5.0000 mg | ORAL_TABLET | Freq: Every day | ORAL | 6 refills | Status: DC

## 2019-09-21 MED ORDER — AMIODARONE HCL 200 MG PO TABS: 200.0000 mg | ORAL_TABLET | Freq: Two times a day (BID) | ORAL | 0 refills | Status: DC

## 2019-09-21 MED ORDER — MAGNESIUM OXIDE 400 (241.3 MG) MG PO TABS
400.0000 mg | ORAL_TABLET | Freq: Two times a day (BID) | ORAL | Status: DC
  Administered 2019-09-21: 400 mg via ORAL
  Filled 2019-09-21: qty 1

## 2019-09-21 MED ORDER — AMIODARONE HCL 200 MG PO TABS: 200.0000 mg | ORAL_TABLET | Freq: Every day | ORAL | 0 refills | Status: DC

## 2019-09-21 MED ORDER — METOPROLOL SUCCINATE ER 25 MG PO TB24: 25.0000 mg | ORAL_TABLET | Freq: Two times a day (BID) | ORAL | 6 refills | Status: DC

## 2019-09-21 MED ORDER — CIPROFLOXACIN HCL 500 MG PO TABS: 500.0000 mg | ORAL_TABLET | Freq: Two times a day (BID) | ORAL | 0 refills | Status: AC

## 2019-09-21 MED ORDER — AMLODIPINE BESYLATE 5 MG PO TABS
5.0000 mg | ORAL_TABLET | Freq: Every day | ORAL | Status: DC
  Administered 2019-09-21: 5 mg via ORAL
  Filled 2019-09-21: qty 1

## 2019-09-21 MED ORDER — METRONIDAZOLE 500 MG PO TABS: 500.0000 mg | ORAL_TABLET | Freq: Three times a day (TID) | ORAL | 0 refills | Status: DC

## 2019-09-21 MED ORDER — CIPROFLOXACIN HCL 500 MG PO TABS: 500.0000 mg | ORAL_TABLET | Freq: Two times a day (BID) | ORAL | 0 refills | Status: DC

## 2019-09-21 MED FILL — metroNIDAZOLE 500 MG TABS: 500 | 3 days supply | Qty: 9 | Fill #0

## 2019-09-21 MED FILL — CIPROFLOXACIN HCL 500 MG TA: 500 | 4 days supply | Qty: 4 | Fill #0

## 2019-09-21 MED FILL — AMIODARONE HCL 200 MG TAB: 200 | 30 days supply | Qty: 39 | Fill #0

## 2019-09-21 NOTE — TOC Transition Note (Signed)
Transition of Care [facility]) - CM/SW Discharge Note   Patient Details  Name: [name] [name] MRN: [id number] Date of Birth: [birth year]  Transition of Care [facility]) CM/SW Contact:  Zenon Mayo, RN Phone Number: 09/21/2019, 9:40 AM   Clinical Narrative:    Patient is for dc today, notified Carolyn with [facility] , that he is for dc. He will also need 3 n 1 and rollator, referral given to [facility] with [employer].      Final next level of care: [place] Barriers to Discharge: No Barriers Identified   Patient Goals and CMS Choice Patient states their goals for this hospitalization and ongoing recovery are:: get better CMS Medicare.gov Compare Post Acute Care list provided to:: Patient Choice offered to / list presented to : Patient  Discharge Placement                       Discharge Plan and Services In-house Referral: NA Discharge Planning Services: CM Consult Post Acute Care Choice: Home Health          DME Arranged: Walker rolling with seat, 3-N-1 DME Agency: [employer] Date DME Agency Contacted: 09/21/19 Time DME Agency Contacted: [phone] Representative spoke with at DME Agency: zack HH Arranged: PT, OT [place] Agency: ([place]) Date [place]: 09/20/19 Time [place]: 1027 Representative spoke with at [place]: [place] ([place]) Interventions     Readmission Risk Interventions Readmission Risk Prevention Plan 09/20/2019  Transportation Screening Complete  Medication Review Press photographer) Complete  PCP or Specialist appointment within 3-5 days of discharge Complete  HRI or [place] Complete  SW Recovery Care/Counseling Consult Complete  [place] Not Applicable  Some recent data might be hidden

## 2019-09-21 NOTE — TOC Progression Note (Signed)
Transition of Care [facility]) - Progression Note    Patient Details  Name: [name] [name] MRN: [id number] Date of Birth: [birth year]  Transition of Care [facility]) CM/SW Contact  Zenon Mayo, RN Phone Number: 09/21/2019, 9:19 AM  Clinical Narrative:    Patient is for dc today, notified Carolyn with [facility] , that he is for dc.   Expected Discharge Plan: [place] Barriers to Discharge: No Barriers Identified  Expected Discharge Plan and Services Expected Discharge Plan: [place] In-house Referral: NA Discharge Planning Services: CM Consult Post Acute Care Choice: [place] arrangements for the past 2 months: Single Family Home Expected Discharge Date: 09/21/19               DME Arranged: (NA)         HH Arranged: PT, OT HH Agency: ([place]) Date [place]: 09/20/19 Time [place]: 1037 Representative spoke with at [place]: [place] ([place]) Interventions    Readmission Risk Interventions Readmission Risk Prevention Plan 09/20/2019  Transportation Screening Complete  Medication Review Press photographer) Complete  PCP or Specialist appointment within 3-5 days of discharge Complete  HRI or [place] Complete  SW Recovery Care/Counseling Consult Complete  [place] Not Applicable  Some recent data might be hidden

## 2019-09-21 NOTE — Discharge Summary (Addendum)
Physician Discharge Summary  Kendon Sedeno FXO:[id number] DOB: 01-08-1941 DOA: 09/13/2019  PCP: Cassandria Anger, MD  Admit date: 09/13/2019 Discharge date: 09/21/2019  Admitted From: Home isposition:  Home  Recommendations for Outpatient Follow-up:  1. Follow up with PCP in 1-2 weeks 2. Please obtain BMP/CBC in one week   Home Health:Yes Equipment/Devices:None  Discharge Condition:Stable CODE STATUS:Full Diet recommendation: Heart Healthy  Brief/Interim Summary: 79 y.o. male past medical history paroxysmal atrial fibrillation on Eliquis, hypertension chronic diastolic heart failure status post TAVR's in August 2020, with multiple myeloma, where reports that he was diagnosed with bone: Severe on August 2020 followed by Dr. Irene Limbo presents to the hospital complaining of abdominal pain bloody diarrhea, he was admitted for severe sepsis and abdominal pain etiology as well of encephalopathy with C. difficile negative, on 09/15/2018 the patient went into A. fib with RVR with evidence of hypotension cardiology was consulted was started on amiodarone drip and now has stabilized.  On 09/16/2018 he developed significant agitation and combativeness.  Discharge Diagnoses:  Principal Problem:   Atrial fibrillation with RVR ([place]) Active Problems:   Essential hypertension   PAF (paroxysmal atrial fibrillation) (HCC)   Acute kidney injury superimposed on CKD (HCC)   S/P TAVR (transcatheter aortic valve replacement)   Multiple myeloma not having achieved remission (HCC)   Bone metastases (HCC)   Peripheral neuropathy   Bloody diarrhea   Abdominal pain, diffuse   Acute metabolic encephalopathy   Goals of care, counseling/discussion   DNR (do not resuscitate)   Pressure injury of skin   Ischemic colitis ([place]) Severe sepsis secondary to ischemic colitis: C. difficile PCR was negative gastrointestinal panel was also negative. He was started empirically on IV vancomycin and cefepime then  transition to IV Flagyl and ciprofloxacin he was fluid resuscitated and his blood pressure stabilized. Blood cultures remain negative, he will continue Cipro and Flagyl as an outpatient for total of 10 days.  Acute metabolic encephalopathy: Patient was obtunded on admission after treating his infectious etiology his encephalopathy resolved.  A. fib with RVR: Cardiology was consulted he was cardioverted on 09/18/2018 started on amiodarone drip then transition to oral. He will continue Eliquis as an outpatient.  Acute kidney injury: With a baseline creatinine of 0.1 on admission 2.2 likely prerenal azotemia resolved with IV fluid hydration.  Peripheral neuropathy due to multiple myeloma: He is currently on Zometa acyclovir steroids and Revlimid. We will continue Neurontin he will follow up with his oncologist as an outpatient.  Peripheral neuropathy likely due to chemotherapy versus multiple myeloma: Continue Neurontin and Cymbalta and oxycodone.  Chronic diastolic heart failure: He appeared to be hypovolemic on admission his diuretics were held on admission. These will be resumed as an outpatient Lasix and Aldactone.   Discharge Instructions  Discharge Instructions    Diet - low sodium heart healthy   Complete by: As directed    Increase activity slowly   Complete by: As directed      Allergies as of 09/21/2019      Reactions   Xarelto [rivaroxaban] Other (See Comments), Hypertension   INCREASED BP-HYPERTENSIVE EVENTS   Corticosteroids Other (See Comments)   Made the patient  "sick," feel "weird," and his "body rejected" them   Ramipril Other (See Comments)   Could not eat or sleep, lost muscle mass   Benazepril Cough      Medication List    STOP taking these medications   amoxicillin 500 MG capsule Commonly known as: AMOXIL  TAKE these medications   acyclovir 400 MG tablet Commonly known as: ZOVIRAX Take 1 tablet (400 mg total) by mouth 2 (two) times daily.    amiodarone 200 MG tablet Commonly known as: PACERONE Take 1 tablet (200 mg total) by mouth 2 (two) times daily for 9 days.   amiodarone 200 MG tablet Commonly known as: Pacerone Take 1 tablet (200 mg total) by mouth daily. Start taking on: October 01, 2019   amLODipine 5 MG tablet Commonly known as: NORVASC Take 1 tablet (5 mg total) by mouth daily. What changed: You were already taking a medication with the same name, and this prescription was added. Make sure you understand how and when to take each.   amLODipine 5 MG tablet Commonly known as: NORVASC Take 1 tablet (5 mg total) by mouth daily. What changed: Another medication with the same name was added. Make sure you understand how and when to take each.   ciprofloxacin 500 MG tablet Commonly known as: Cipro Take 1 tablet (500 mg total) by mouth 2 (two) times daily for 2 days.   dexamethasone 4 MG tablet Commonly known as: DECADRON Take 3 tablets (12 mg total) by mouth once a week. What changed: when to take this   diclofenac Sodium 1 % Gel Commonly known as: VOLTAREN Apply 1 application topically 4 (four) times daily as needed (pain).   DULoxetine 60 MG capsule Commonly known as: CYMBALTA Take 1 capsule (60 mg total) by mouth daily.   Eliquis 5 MG Tabs tablet Generic drug: apixaban TAKE 1 TABLET BY MOUTH TWICE A DAY What changed: how much to take   ergocalciferol 1.25 MG (50000 UT) capsule Commonly known as: VITAMIN D2 Take 1 capsule (50,000 Units total) by mouth once a week.   famotidine 40 MG tablet Commonly known as: Pepcid Take 1 tablet (40 mg total) by mouth daily.   fentaNYL 12 MCG/HR Commonly known as: [place] 1 patch onto the skin every 3 (three) days.   furosemide 20 MG tablet Commonly known as: LASIX Take 1 tablet (20 mg total) by mouth daily. What changed:   how much to take  how to take this  when to take this  additional instructions   gabapentin 300 MG capsule Commonly  known as: NEURONTIN Take 1 in the morning and 2 tablet at bedtime. What changed:   how much to take  how to take this  when to take this  additional instructions   lenalidomide 15 MG capsule Commonly known as: REVLIMID Take 1 capsule (15 mg total) by mouth daily. Take for 14 days on, 7 days off, repeat every 21 days. What changed:   when to take this  additional instructions   loratadine 10 MG tablet Commonly known as: CLARITIN Take 1 tablet (10 mg total) by mouth daily.   LORazepam 0.5 MG tablet Commonly known as: Ativan Take 1 tablet (0.5 mg total) by mouth every 6 (six) hours as needed (Nausea or vomiting).   LUBRICATING EYE DROPS OP Place 1 drop into both eyes daily as needed (dry eyes).   metoprolol succinate 25 MG 24 hr tablet Commonly known as: TOPROL-XL Take 1 tablet (25 mg total) by mouth 2 (two) times daily.   metroNIDAZOLE 500 MG tablet Commonly known as: Flagyl Take 1 tablet (500 mg total) by mouth 3 (three) times daily for 3 days.   ondansetron 8 MG tablet Commonly known as: Zofran Take 1 tablet (8 mg total) by mouth 2 (two) times daily as  needed (Nausea or vomiting).   oxyCODONE 5 MG immediate release tablet Commonly known as: Oxy IR/ROXICODONE Take 1-2 tablets (5-10 mg total) by mouth every 4 (four) hours as needed for moderate pain or severe pain.   potassium chloride SA 20 MEQ tablet Commonly known as: [place]-Con M20 Take 2 tablets (40 mEq total) by mouth 2 (two) times daily.   prochlorperazine 10 MG tablet Commonly known as: COMPAZINE Take 1 tablet (10 mg total) by mouth every 6 (six) hours as needed (Nausea or vomiting).   senna-docusate 8.6-50 MG tablet Commonly known as: Senna S Take 2 tablets by mouth at bedtime. What changed:   when to take this  reasons to take this   spironolactone 25 MG tablet Commonly known as: ALDACTONE Take 1 tablet (25 mg total) by mouth daily. What changed:   medication strength  how much to take    Vitamin D 50 MCG (2000 UT) tablet Take 2,000 Units by mouth daily.            Durable Medical Equipment  (From admission, onward)         Start     Ordered   09/21/19 0936  For home use only DME 3 n 1  Once     09/21/19 0936   09/21/19 0936  For home use only DME 4 wheeled rolling walker with seat  Once    Question:  Patient needs a walker to treat with the following condition  Answer:  Weakness   09/21/19 0936         Follow-up Information    [facility] Follow up on 10/01/2019.   Specialty: Cardiology Why: at 1000. Garage Code 6009 Contact information: [address] [id number] [place] [phone]       Home, [facility] Follow up.   Why: HHRN,HHPT Contact information: [place] [address] [phone]        [name], [name], MD. Go on 09/25/2019.   Specialty: Internal Medicine Why: _0 :00pm Contact information: [place] [address] [phone]        [facility] Follow up.   Why: 3 n 1, rollator         Allergies  Allergen Reactions  . Xarelto [Rivaroxaban] Other (See Comments) and Hypertension    INCREASED BP-HYPERTENSIVE EVENTS  . Corticosteroids Other (See Comments)    Made the patient  "sick," feel "weird," and his "body rejected" them   . Ramipril Other (See Comments)    Could not eat or sleep, lost muscle mass  . Benazepril Cough    Consultations:  Cardiology   Procedures/Studies: CT ABDOMEN PELVIS WO CONTRAST  Result Date: 09/14/2019 CLINICAL DATA:  Abdominal pain. Nausea, vomiting, diarrhea and liquid stool. History of multiple myeloma. EXAM: CT ABDOMEN AND PELVIS WITHOUT CONTRAST TECHNIQUE: Multidetector CT imaging of the abdomen and pelvis was performed following the standard protocol without IV contrast. COMPARISON:  CT angiography 03/14/2019. PET CT 04/06/2019 FINDINGS: Lower chest: Dilated distal esophagus  containing enteric contrast. Minimal scarring in the right middle lobe and lingula. Minimal dependent atelectasis in the left lower lobe. No pleural fluid. Hepatobiliary: No focal liver lesion on noncontrast exam. Mild gallbladder distention without calcified gallstone or biliary dilatation. No pericholecystic inflammation. Pancreas: Parenchymal atrophy. No ductal dilatation or inflammation. Spleen: Normal in size without focal abnormality. Adrenals/Urinary Tract: Normal adrenal glands. No hydronephrosis or perinephric edema. Bilateral renal cysts. Ureters are decompressed. Urinary bladder is  partially distended. Bladder displaced anteriorly by a stool ball distending the rectum. Stomach/Bowel: Enteric contrast in the distal esophagus which is dilated. Distended stomach with air and oral contrast. There is no gastric wall thickening. No evidence of gastric outlet obstruction or mass effect. No small bowel obstruction or dilatation, administered enteric contrast reaches the cecum. Appendix not well visualized, no evidence of appendicitis. Small volume of formed stool in the ascending colon, liquid stool in the transverse and descending colon. Stool within tortuous sigmoid colon. Sigmoid colonic wall thickening, series 3, image 62. Large stool ball distends the rectum with rectal distention of 8 cm. Mild perirectal fat stranding. No evidence of pneumatosis or perforation. Vascular/Lymphatic: Aorta bi-iliac atherosclerosis. Decreased size of previous retroperitoneal lymph node anterior to the IVC since prior PET, no longer visualized. No enlarged lymph nodes in the abdomen or pelvis. Reproductive: Prostate is unremarkable. Other: No ascites. No free air. Fat in the left inguinal canal. Musculoskeletal: Multiple bone lesions involving the pelvis, largest lesion with lytic in the right acetabulum. Overall no significant change in size from prior exam. Additional small bone lesions involving ribs. Stable endplate  concavity at Y18 and T12. No new osseous lesions. IMPRESSION: 1. Large stool ball distending the rectum with rectal distention and mild perirectal fat stranding. Findings consistent with fecal impaction. There is also wall thickening of the adjacent sigmoid colon with mild pericolonic edema. Possibility of stercoral colitis is raised. 2. Findings suggesting gastroparesis with gastric distension with air and contrast, as well as distended distal esophagus that is contrast filled. No evidence of gastric outlet obstruction or mechanical obstructing lesion. No bowel obstruction, administered enteric contrast reaches the cecum. 3. Decreased size of previous retroperitoneal lymph node since prior PET, no longer visualized. 4. Multiple pelvic bone lesions consistent with history of multiple myeloma, grossly unchanged in size from prior exam. Aortic Atherosclerosis (ICD10-I70.0). Electronically Signed   By: Keith Rake M.D.   On: 09/14/2019 04:05   CT HEAD WO CONTRAST  Result Date: 09/14/2019 CLINICAL DATA:  Presyncope EXAM: CT HEAD WITHOUT CONTRAST TECHNIQUE: Contiguous axial images were obtained from the base of the skull through the vertex without intravenous contrast. COMPARISON:  MRI 02/17/2017 FINDINGS: Brain: No evidence of acute infarction, hemorrhage, hydrocephalus, extra-axial collection or mass lesion/mass effect. Scattered low-density changes within the periventricular and subcortical white matter compatible with chronic microvascular ischemic change. Mild diffuse cerebral volume loss. Vascular: Mild atherosclerotic calcifications involving the large vessels of the skull base. No unexpected hyperdense vessel. Skull: Normal. Negative for fracture or focal lesion. Sinuses/Orbits: No acute finding. Other: None. IMPRESSION: 1.  No acute intracranial findings. 2.  Chronic microvascular ischemic change and cerebral volume loss. Electronically Signed   By: Davina Poke D.O.   On: 09/14/2019 09:59   DG  Chest Port 1 View  Result Date: 09/14/2019 CLINICAL DATA:  Weakness. EXAM: PORTABLE CHEST 1 VIEW COMPARISON:  Radiograph 05/09/2019 FINDINGS: Normal heart size and mediastinal contours. TAVR. No pulmonary edema, focal airspace disease, pleural effusion or pneumothorax. No acute osseous abnormalities are seen. IMPRESSION: No acute chest findings. Electronically Signed   By: Keith Rake M.D.   On: 09/14/2019 02:23   ECHOCARDIOGRAM LIMITED  Result Date: 09/19/2019   ECHOCARDIOGRAM LIMITED REPORT   Patient Name:   [facility] Date of Exam: 09/19/2019 Medical Rec #:  [id number]     Height:       66.0 in Accession #:    [id number]    Weight:       165.8  lb Date of Birth:  20-Oct-1940     BSA:          1.85 m Patient Age:    79 years      BP:           135/96 mmHg Patient Gender: M             HR:           104 bpm. Exam Location:  Inpatient  Procedure: 3D Echo, Color Doppler and Cardiac Doppler Indications:    CHF-Acute Diastolic 604.54 / U98.11  History:        Patient has prior history of Echocardiogram examinations, most                 recent 05/30/2019. Aortic Valve: A Two 27m Edwards Sapien                 bioprosthetic, stented aortic valve (TAVR) Procedure Date:                 05/08/2019 Chronic kidney disease. Multiple myeloma.  Sonographer:    TDarlina SicilianRDCS Referring Phys: [place] 1. Left ventricular ejection fraction, by visual estimation, is 60 to 65%. The left ventricle has normal function. There is mildly increased left ventricular wall thickness.  2. Global right ventricle has normal systolic function.The right ventricular size is normal.  3. Small to moderate pericardial effusion, measures up to 1.0cm adjacent to RV free wall  4. The inferior vena cava is normal in size with greater than 50% respiratory variability, suggesting right atrial pressure of 3 mmHg.  5. S/p bioprosthetic AVR. No aortic regurgitation. Bioprosthesis functioning normally. MG 10 mmHg, Vmax 2.3  m/s, DI 0.52  6. Compared to prior TTE on 05/30/19, gradient across bioprosthetic aortic valve is decreased FINDINGS  Left Ventricle: Left ventricular ejection fraction, by visual estimation, is 60 to 65%. The left ventricle has normal function. There is mildly increased left ventricular wall thickness. Left ventricular diastolic parameters are indeterminate. Right Ventricle: The right ventricular size is normal. No increase in right ventricular wall thickness. Global RV systolic function is has normal systolic function. Pericardium: A small pericardial effusion is present is seen. A small pericardial effusion is present. Mitral Valve: The mitral valve is normal in structure. No evidence of mitral valve regurgitation. Tricuspid Valve: The tricuspid valve is normal in structure. Tricuspid valve regurgitation is not demonstrated. Aortic Valve: Aortic valve regurgitation is not visualized. Aortic valve mean gradient measures 8.5 mmHg. Aortic valve peak gradient measures 17.1 mmHg. Aortic valve area, by VTI measures 1.80 cm. Two 255mEdwards Sapien bioprosthetic, stented aortic valve (TAVR) valve is present in the aortic position. Procedure Date: 05/08/2019. Pulmonic Valve: The pulmonic valve was not well visualized. Pulmonic valve regurgitation is not visualized by color flow Doppler. Pulmonic regurgitation is not visualized by color flow Doppler. Aorta: The aortic root is normal in size and structure. Venous: The inferior vena cava is normal in size with greater than 50% respiratory variability, suggesting right atrial pressure of 3 mmHg.  LEFT VENTRICLE          Normals PLAX 2D LVIDd:         3.90 cm  3.6 cm LVIDs:         2.90 cm  1.7 cm LV PW:         1.10 cm  1.4 cm LV IVS:        1.40 cm  1.3 cm LVOT  diam:     2.00 cm  2.0 cm LV SV:         34 ml    79 ml LV SV Index:   17.86    45 ml/m2 LVOT Area:     3.14 cm 3.14 cm2  LEFT ATRIUM         Index LA diam:    3.50 cm 1.90 cm/m  AORTIC VALVE                     Normals AV Area (Vmax):    1.38 cm AV Area (Vmean):   1.65 cm     3.06 cm2 AV Area (VTI):     1.80 cm AV Vmax:           206.75 cm/s AV Vmean:          134.250 cm/s 77 cm/s AV VTI:            0.271 m      3.15 cm2 AV Peak Grad:      17.1 mmHg AV Mean Grad:      8.5 mmHg     3 mmHg LVOT Vmax:         90.80 cm/s LVOT Vmean:        70.700 cm/s  75 cm/s LVOT VTI:          0.156 m      25.3 cm LVOT/AV VTI ratio: 0.57         1  AORTA                 Normals Ao Root diam: 2.50 cm 31 mm  SHUNTS Systemic VTI:  0.16 m Systemic Diam: 2.00 cm  Oswaldo Milian MD Electronically signed by Oswaldo Milian MD Signature Date/Time: 09/19/2019/3:26:18 PMThe mitral valve is normal in structure.    Final    (Echo, Carotid, EGD, Colonoscopy, ERCP)    Subjective: No complaints feels great.  Discharge Exam: Vitals:   09/21/19 0513 09/21/19 0922  BP: (!) 146/88 (!) 158/90  Pulse: 64 65  Resp: 19 20  Temp: (!) 97.5 F (36.4 C) 97.8 F (36.6 C)  SpO2: 97% 97%   Vitals:   09/20/19 2128 09/21/19 0105 09/21/19 0513 09/21/19 0922  BP: (!) 145/86 (!) 154/86 (!) 146/88 (!) 158/90  Pulse: 68 66 64 65  Resp: _0 Temp: 98.3 F (36.8 C) 97.6 F (36.4 C) (!) 97.5 F (36.4 C) 97.8 F (36.6 C)  TempSrc:  Oral Oral Oral  SpO2: 98% 99% 97% 97%  Weight:   75.4 kg   Height:        General: Pt is alert, awake, not in acute distress Cardiovascular: RRR, S1/S2 +, no rubs, no gallops Respiratory: CTA bilaterally, no wheezing, no rhonchi Abdominal: Soft, NT, ND, bowel sounds + Extremities: no edema, no cyanosis    The results of significant diagnostics from this hospitalization (including imaging, microbiology, ancillary and laboratory) are listed below for reference.     Microbiology: Recent Results (from the past 240 hour(s))  GI pathogen panel by PCR, stool     Status: None   Collection Time: 09/14/19  1:00 AM   Specimen: Stool  Result Value Ref Range Status   Plesiomonas shigelloides NOT  DETECTED NOT DETECTED Final   Yersinia enterocolitica NOT DETECTED NOT DETECTED Final   Vibrio NOT DETECTED NOT DETECTED Final   Enteropathogenic E coli NOT DETECTED NOT DETECTED Final   E coli (ETEC)  LT/ST NOT DETECTED NOT DETECTED Final   E coli 6384 by PCR Not applicable NOT DETECTED Final   Cryptosporidium by PCR NOT DETECTED NOT DETECTED Final   Entamoeba histolytica NOT DETECTED NOT DETECTED Final   Adenovirus F 40/41 NOT DETECTED NOT DETECTED Final   Norovirus GI/GII NOT DETECTED NOT DETECTED Final   Sapovirus NOT DETECTED NOT DETECTED Final    Comment: (NOTE) Performed At: [facility] [place], [place] [id number] Rush Farmer MD ZY:[id number]    Vibrio cholerae NOT DETECTED NOT DETECTED Final   Campylobacter by PCR NOT DETECTED NOT DETECTED Final   Salmonella by PCR NOT DETECTED NOT DETECTED Final   E coli (STEC) NOT DETECTED NOT DETECTED Final   Enteroaggregative E coli NOT DETECTED NOT DETECTED Final   Shigella by PCR NOT DETECTED NOT DETECTED Final   Cyclospora cayetanensis NOT DETECTED NOT DETECTED Final   Astrovirus NOT DETECTED NOT DETECTED Final   G lamblia by PCR NOT DETECTED NOT DETECTED Final   Rotavirus A by PCR NOT DETECTED NOT DETECTED Final  C difficile quick scan w PCR reflex     Status: None   Collection Time: 09/14/19  1:00 AM   Specimen: Stool  Result Value Ref Range Status   C Diff antigen NEGATIVE NEGATIVE Final   C Diff toxin NEGATIVE NEGATIVE Final   C Diff interpretation No C. difficile detected.  Final    Comment: Performed at [facility], [place] [address] [place], [place] [address]  Respiratory Panel by RT PCR (Flu A&B, Covid) - Nasopharyngeal Swab     Status: None   Collection Time: 09/14/19  1:16 AM   Specimen: Nasopharyngeal Swab  Result Value Ref Range Status   SARS Coronavirus 2 by RT PCR NEGATIVE NEGATIVE Final    Comment: (NOTE) SARS-CoV-2 target nucleic acids are NOT DETECTED. The SARS-CoV-2 RNA is  generally detectable in upper respiratoy specimens during the acute phase of infection. The lowest concentration of SARS-CoV-2 viral copies this assay can detect is 131 copies/mL. A negative result does not preclude SARS-Cov-2 infection and should not be used as the sole basis for treatment or other patient management decisions. A negative result may occur with  improper specimen collection/handling, submission of specimen other than nasopharyngeal swab, presence of viral mutation(s) within the areas targeted by this assay, and inadequate number of viral copies (<131 copies/mL). A negative result must be combined with clinical observations, patient history, and epidemiological information. The expected result is Negative. Fact Sheet for Patients:  PinkCheek.be Fact Sheet for Healthcare Providers:  GravelBags.it This test is not yet ap proved or cleared by the [place] FDA and  has been authorized for detection and/or diagnosis of SARS-CoV-2 by FDA under an Emergency Use Authorization (EUA). This EUA will remain  in effect (meaning this test can be used) for the duration of the COVID-19 declaration under Section 564(b)(1) of the Act, 21 U.S.C. section 360bbb-3(b)(1), unless the authorization is terminated or revoked sooner.    Influenza A by PCR NEGATIVE NEGATIVE Final   Influenza B by PCR NEGATIVE NEGATIVE Final    Comment: (NOTE) The Xpert Xpress SARS-CoV-2/FLU/RSV assay is intended as an aid in  the diagnosis of influenza from Nasopharyngeal swab specimens and  should not be used as a sole basis for treatment. Nasal washings and  aspirates are unacceptable for Xpert Xpress SARS-CoV-2/FLU/RSV  testing. Fact Sheet for Patients: PinkCheek.be Fact Sheet for Healthcare Providers: GravelBags.it This test is not yet approved or  cleared by the [place] and   has been authorized for detection and/or diagnosis of SARS-CoV-2 by  FDA under an Emergency Use Authorization (EUA). This EUA will remain  in effect (meaning this test can be used) for the duration of the  Covid-19 declaration under Section 564(b)(1) of the Act, 21  U.S.C. section 360bbb-3(b)(1), unless the authorization is  terminated or revoked. Performed at [facility], [place] [address] [place], [place] [address]   Culture, Urine     Status: None   Collection Time: 09/15/19 11:36 AM   Specimen: Urine, Clean Catch  Result Value Ref Range Status   Specimen Description URINE, CLEAN CATCH  Final   Special Requests NONE  Final   Culture   Final    NO GROWTH Performed at [facility], [place] [address], [place], [place] [address]    Report Status 09/16/2019 FINAL  Final     Labs: BNP (last 3 results) Recent Labs    05/04/19 1620 06/11/19 1235 07/30/19 1028  BNP 1,279.6* 508.3* 315.9*   Basic Metabolic Panel: Recent Labs  Lab 09/17/19 0704 09/18/19 0345 09/19/19 0458 09/19/19 1515 09/20/19 0543 09/21/19 0447  NA 138 137 140  --  141 137  K 3.2* 3.6 3.1*  --  3.7 3.1*  CL 108 105 109  --  108 103  CO2 20* 17* 19*  --  21* 23  GLUCOSE 91 131* 100*  --  95 93  BUN 11 11 5*  --  7* 5*  CREATININE 0.89 0.96 0.80  --  0.73 0.69  CALCIUM 7.9* 7.9* 7.8*  --  7.8* 8.1*  MG  --   --   --  1.6* 1.6*  --    Liver Function Tests: Recent Labs  Lab 09/16/19 0845  AST 30  ALT 25  ALKPHOS 95  BILITOT 0.7  PROT 4.3*  ALBUMIN 2.7*   No results for input(s): LIPASE, AMYLASE in the last 168 hours. No results for input(s): AMMONIA in the last 168 hours. CBC: Recent Labs  Lab 09/16/19 0845 09/17/19 0704 09/18/19 0345 09/19/19 0458 09/20/19 0543  WBC 7.5 7.8 6.9 8.5 7.4  HGB 11.7* 11.8* 12.3* 12.8* 11.5*  HCT 35.3* 35.3* 36.6* 37.0* 33.5*  MCV 92.4 89.8 90.1 87.5 88.2  PLT 80* 86* 106* 129* 144*   Cardiac Enzymes: No results for input(s): CKTOTAL, CKMB,  CKMBINDEX, TROPONINI in the last 168 hours. BNP: Invalid input(s): POCBNP CBG: Recent Labs  Lab 09/19/19 2116 09/20/19 0649 09/20/19 1646 09/20/19 2201 09/21/19 0652  GLUCAP 103* 94 94 96 90   D-Dimer No results for input(s): DDIMER in the last 72 hours. Hgb A1c No results for input(s): HGBA1C in the last 72 hours. Lipid Profile No results for input(s): CHOL, HDL, LDLCALC, TRIG, CHOLHDL, LDLDIRECT in the last 72 hours. Thyroid function studies No results for input(s): TSH, T4TOTAL, T3FREE, THYROIDAB in the last 72 hours.  Invalid input(s): FREET3 Anemia work up No results for input(s): VITAMINB12, FOLATE, FERRITIN, TIBC, IRON, RETICCTPCT in the last 72 hours. Urinalysis    Component Value Date/Time   COLORURINE AMBER (A) 09/14/2019 1300   APPEARANCEUR HAZY (A) 09/14/2019 1300   LABSPEC 1.019 09/14/2019 1300   PHURINE 5.0 09/14/2019 1300   GLUCOSEU NEGATIVE 09/14/2019 1300   GLUCOSEU NEGATIVE 11/19/2015 1352   HGBUR NEGATIVE 09/14/2019 1300   BILIRUBINUR NEGATIVE 09/14/2019 1300   KETONESUR NEGATIVE 09/14/2019 1300   PROTEINUR NEGATIVE 09/14/2019 1300   UROBILINOGEN 0.2 11/19/2015  [place] 09/14/2019 [place] 09/14/2019 1300   Sepsis Labs Invalid input(s): PROCALCITONIN,  WBC,  LACTICIDVEN Microbiology Recent Results (from the past 240 hour(s))  GI pathogen panel by PCR, stool     Status: None   Collection Time: 09/14/19  1:00 AM   Specimen: Stool  Result Value Ref Range Status   Plesiomonas shigelloides NOT DETECTED NOT DETECTED Final   Yersinia enterocolitica NOT DETECTED NOT DETECTED Final   Vibrio NOT DETECTED NOT DETECTED Final   Enteropathogenic E coli NOT DETECTED NOT DETECTED Final   E coli (ETEC) LT/ST NOT DETECTED NOT DETECTED Final   E coli 4287 by PCR Not applicable NOT DETECTED Final   Cryptosporidium by PCR NOT DETECTED NOT DETECTED Final   Entamoeba histolytica NOT DETECTED NOT DETECTED Final   Adenovirus F  40/41 NOT DETECTED NOT DETECTED Final   Norovirus GI/GII NOT DETECTED NOT DETECTED Final   Sapovirus NOT DETECTED NOT DETECTED Final    Comment: (NOTE) Performed At: [facility] [place], [place] [id number] Rush Farmer MD MB:[id number]    Vibrio cholerae NOT DETECTED NOT DETECTED Final   Campylobacter by PCR NOT DETECTED NOT DETECTED Final   Salmonella by PCR NOT DETECTED NOT DETECTED Final   E coli (STEC) NOT DETECTED NOT DETECTED Final   Enteroaggregative E coli NOT DETECTED NOT DETECTED Final   Shigella by PCR NOT DETECTED NOT DETECTED Final   Cyclospora cayetanensis NOT DETECTED NOT DETECTED Final   Astrovirus NOT DETECTED NOT DETECTED Final   G lamblia by PCR NOT DETECTED NOT DETECTED Final   Rotavirus A by PCR NOT DETECTED NOT DETECTED Final  C difficile quick scan w PCR reflex     Status: None   Collection Time: 09/14/19  1:00 AM   Specimen: Stool  Result Value Ref Range Status   C Diff antigen NEGATIVE NEGATIVE Final   C Diff toxin NEGATIVE NEGATIVE Final   C Diff interpretation No C. difficile detected.  Final    Comment: Performed at [facility], [place] [address] [place], [place] [address]  Respiratory Panel by RT PCR (Flu A&B, Covid) - Nasopharyngeal Swab     Status: None   Collection Time: 09/14/19  1:16 AM   Specimen: Nasopharyngeal Swab  Result Value Ref Range Status   SARS Coronavirus 2 by RT PCR NEGATIVE NEGATIVE Final    Comment: (NOTE) SARS-CoV-2 target nucleic acids are NOT DETECTED. The SARS-CoV-2 RNA is generally detectable in upper respiratoy specimens during the acute phase of infection. The lowest concentration of SARS-CoV-2 viral copies this assay can detect is 131 copies/mL. A negative result does not preclude SARS-Cov-2 infection and should not be used as the sole basis for treatment or other patient management decisions. A negative result may occur with  improper specimen collection/handling, submission of specimen  other than nasopharyngeal swab, presence of viral mutation(s) within the areas targeted by this assay, and inadequate number of viral copies (<131 copies/mL). A negative result must be combined with clinical observations, patient history, and epidemiological information. The expected result is Negative. Fact Sheet for Patients:  PinkCheek.be Fact Sheet for Healthcare Providers:  GravelBags.it This test is not yet ap proved or cleared by the [place] FDA and  has been authorized for detection and/or diagnosis of SARS-CoV-2 by FDA under an Emergency Use Authorization (EUA). This EUA will remain  in effect (meaning this test can be used) for the duration of the COVID-19 declaration  under Section 564(b)(1) of the Act, 21 U.S.C. section 360bbb-3(b)(1), unless the authorization is terminated or revoked sooner.    Influenza A by PCR NEGATIVE NEGATIVE Final   Influenza B by PCR NEGATIVE NEGATIVE Final    Comment: (NOTE) The Xpert Xpress SARS-CoV-2/FLU/RSV assay is intended as an aid in  the diagnosis of influenza from Nasopharyngeal swab specimens and  should not be used as a sole basis for treatment. Nasal washings and  aspirates are unacceptable for Xpert Xpress SARS-CoV-2/FLU/RSV  testing. Fact Sheet for Patients: PinkCheek.be Fact Sheet for Healthcare Providers: GravelBags.it This test is not yet approved or cleared by the [place] FDA and  has been authorized for detection and/or diagnosis of SARS-CoV-2 by  FDA under an Emergency Use Authorization (EUA). This EUA will remain  in effect (meaning this test can be used) for the duration of the  Covid-19 declaration under Section 564(b)(1) of the Act, 21  U.S.C. section 360bbb-3(b)(1), unless the authorization is  terminated or revoked. Performed at [facility], [place] [address], [place], [place] [address]    Culture, Urine     Status: None   Collection Time: 09/15/19 11:36 AM   Specimen: Urine, Clean Catch  Result Value Ref Range Status   Specimen Description URINE, CLEAN CATCH  Final   Special Requests NONE  Final   Culture   Final    NO GROWTH Performed at [facility], [place] [address], [place], [place] [address]    Report Status 09/16/2019 FINAL  Final     Time coordinating discharge: Over 40 minutes  SIGNED:   Charlynne Cousins, MD  [facility] 09/21/2019, 11:17 AM Pager   If 7PM-7AM, please contact night-coverage [email or web address] Password [email or web address]

## 2019-09-21 NOTE — Progress Notes (Addendum)
Patient ID: [name] [name], male   DOB: 1941/01/18, 79 y.o.   MRN: [id number]     Advanced Heart Failure Rounding Note  PCP-Cardiologist: No primary care provider on file.   Subjective:    Maintaining NSR on tele after DCCV 1/6. HR in the 70s. No cardiac complaints this am.  Continues to talk about his leg pain.  No other issues.   Objective:   Weight Range: 75.4 kg Body mass index is 23.18 kg/m.   Vital Signs:   Temp:  [97.5 F (36.4 C)-98.3 F (36.8 C)] 97.5 F (36.4 C) (01/08 0513) Pulse Rate:  [64-72] 64 (01/08 0513) Resp:  [18-20] 19 (01/08 0513) BP: (145-156)/(86-90) 146/88 (01/08 0513) SpO2:  [97 %-99 %] 97 % (01/08 0513) Weight:  [75.4 kg] 75.4 kg (01/08 0513) Last BM Date: 09/19/19  Weight change: Filed Weights   09/19/19 1245 09/20/19 0459 09/21/19 0513  Weight: 75.2 kg 75.4 kg 75.4 kg    Intake/Output:   Intake/Output Summary (Last 24 hours) at 09/21/2019 0833 Last data filed at 09/21/2019 0349 Gross per 24 hour  Intake 1682.4 ml  Output 425 ml  Net 1257.4 ml      Physical Exam    PHYSICAL EXAM: General:  Well appearing. No respiratory difficulty HEENT: normal Neck: supple. no JVD. Carotids 2+ bilat; no bruits. No lymphadenopathy or thyromegaly appreciated. Cor: PMI nondisplaced. Regular rate & rhythm. No rubs, gallops. + 2/6 Murmur RUSB  Lungs: clear Abdomen: soft, nontender, nondistended. No hepatosplenomegaly. No bruits or masses. Good bowel sounds. Extremities: no cyanosis, clubbing, rash, edema Neuro: alert & oriented x 3, cranial nerves grossly intact. moves all 4 extremities w/o difficulty. Affect pleasant.  Telemetry    NSR 70s personally reviewed.   Labs    CBC Recent Labs    09/19/19 0458 09/20/19 0543  WBC 8.5 7.4  HGB 12.8* 11.5*  HCT 37.0* 33.5*  MCV 87.5 88.2  PLT 129* 854*   Basic Metabolic Panel Recent Labs    09/19/19 1515 09/20/19 0543 09/21/19 0447  NA  --  141 137  K  --  3.7 3.1*  CL  --  108 103  CO2  --   21* 23  GLUCOSE  --  95 93  BUN  --  7* 5*  CREATININE  --  0.73 0.69  CALCIUM  --  7.8* 8.1*  MG 1.6* 1.6*  --    Liver Function Tests No results for input(s): AST, ALT, ALKPHOS, BILITOT, PROT, ALBUMIN in the last 72 hours. No results for input(s): LIPASE, AMYLASE in the last 72 hours. Cardiac Enzymes No results for input(s): CKTOTAL, CKMB, CKMBINDEX, TROPONINI in the last 72 hours.  BNP: BNP (last 3 results) Recent Labs    05/04/19 1620 06/11/19 1235 07/30/19 1028  BNP 1,279.6* 508.3* 126.5*    ProBNP (last 3 results) No results for input(s): PROBNP in the last 8760 hours.   D-Dimer No results for input(s): DDIMER in the last 72 hours. Hemoglobin A1C No results for input(s): HGBA1C in the last 72 hours. Fasting Lipid Panel No results for input(s): CHOL, HDL, LDLCALC, TRIG, CHOLHDL, LDLDIRECT in the last 72 hours. Thyroid Function Tests No results for input(s): TSH, T4TOTAL, T3FREE, THYROIDAB in the last 72 hours.  Invalid input(s): FREET3  Other results:   Imaging    No results found.   Medications:     Scheduled Medications: . acyclovir  400 mg Oral BID  . amiodarone  200 mg Oral BID  . apixaban  5 mg Oral BID  . dexamethasone  12 mg Oral Q Mon  . DULoxetine  60 mg Oral Daily  . feeding supplement (ENSURE ENLIVE)  237 mL Oral TID BM  . fentaNYL  1 patch Transdermal Q72H  . furosemide  20 mg Oral Daily  . gabapentin  100 mg Oral q morning - 10a   And  . gabapentin  200 mg Oral QHS  . metoprolol succinate  25 mg Oral Daily  . multivitamin with minerals  1 tablet Oral Daily  . sodium chloride flush  3 mL Intravenous Q12H  . spironolactone  25 mg Oral Daily    Infusions: . ciprofloxacin 400 mg (09/20/19 2141)  . metronidazole 500 mg (09/21/19 0022)  . sodium chloride      PRN Medications: acetaminophen **OR** acetaminophen, diclofenac Sodium, fentaNYL (SUBLIMAZE) injection, haloperidol lactate, LORazepam, ondansetron **OR** ondansetron  (ZOFRAN) IV, oxyCODONE, sodium chloride   Assessment/Plan   1. Shock: Resolved. Possible cardiogenic due to atrial fibrillation with RVR and associated with ischemic colitis (abdominal symptoms, high lactate).  - EF 60-65% on echo  2. GI: Abdominal pain with diarrhea at admission associated with elevated lactate, PCT 40.  Concern for ischemic colitis in setting of afib/RVR. Afebrile. On abx therapy w/ cipro + flagyl.  Abdominal exam benign.   Stable hgb and no overt GI bleeding.  3. Atrial fibrillation: History PAF.  Has traditionally tolerated atrial fibrillation poorly.  S/p DCCV 1/6. Maintaining NSR on tele. HR 70s.  - continue PO amiodarone 200 mg bid x 10 days then 200 mg daily long-term.  - Continue Toprol XL 25 mg daily  - Continue  apixaban 5 mg po bid .  4. Bicuspid aortic valve disorder: Ascending aorta was 4.3 cm on recent coronary CTA.He is s/p TAVR with 2 Edwards Sapien THVs (valve-in-valve due to peri-valvular leak after 1st valve placed). Post-op, mean gradient across the aortic valve has been elevated, most recently 26 mmHg in 9/20. Dimensionless index, most recently 0.54, only suggests mild bioprosthetic aortic stenosis. 2D Echo 09/19/19 demonstrates Bioprosthesis functioning normally. MG 10 mmHg, Vmax 2.3 m/s, DI 0.52 5. AKI: Resolved.  6. Chronic diastolic CHF: Echo in 5/73 with EF >65%.  He is not volume overloaded on exam.  - Volume status stable.  - Continue Lasix 20 mg daily - Continue spironolactone 25 mg daily  - EF normal by TTE 09/19/19, 60-65% 7. Foot pain: Severe neuropathic pain in feet, very distressing to him.  Needs PT 8. Multiple myeloma: Per Dr. Irene Limbo.  9. Neuro: Resolved. No longer confused. His wife is wondering if Lorrin Mais may contributed to confusion.  10. Hypokalemia: K 3.1 today. Will give 40 mEq of Kdur x 1.   [facility] F/u has been made for 10/01/19. Appt info in AVS.   Length of Stay: [address], PA-C  09/21/2019, 8:33  AM  Advanced Heart Failure Team Pager [phone] (M-F; 7a - 4p)  Please contact [facility] for night-coverage after hours (4p -7a ) and weekends on [email or web address]  Patient seen with PA, agree with the above note.   Plan for home today. He remains in NSR.  With BP still elevated, would add back amlodipine 5 mg daily and would have him take KCl 40 daily.   [name] 09/21/2019 9:38 AM

## 2019-09-21 NOTE — Discharge Instructions (Signed)

## 2019-09-22 DIAGNOSIS — Z9181 History of falling: Secondary | ICD-10-CM | POA: Diagnosis not present

## 2019-09-22 DIAGNOSIS — K559 Vascular disorder of intestine, unspecified: Secondary | ICD-10-CM | POA: Diagnosis not present

## 2019-09-22 DIAGNOSIS — C7951 Secondary malignant neoplasm of bone: Secondary | ICD-10-CM | POA: Diagnosis not present

## 2019-09-22 DIAGNOSIS — I480 Paroxysmal atrial fibrillation: Secondary | ICD-10-CM | POA: Diagnosis not present

## 2019-09-22 DIAGNOSIS — G620 Drug-induced polyneuropathy: Secondary | ICD-10-CM | POA: Diagnosis not present

## 2019-09-22 DIAGNOSIS — N400 Enlarged prostate without lower urinary tract symptoms: Secondary | ICD-10-CM | POA: Diagnosis not present

## 2019-09-22 DIAGNOSIS — G622 Polyneuropathy due to other toxic agents: Secondary | ICD-10-CM | POA: Diagnosis not present

## 2019-09-22 DIAGNOSIS — E785 Hyperlipidemia, unspecified: Secondary | ICD-10-CM | POA: Diagnosis not present

## 2019-09-22 DIAGNOSIS — Z7901 Long term (current) use of anticoagulants: Secondary | ICD-10-CM | POA: Diagnosis not present

## 2019-09-22 DIAGNOSIS — T82857D Stenosis of cardiac prosthetic devices, implants and grafts, subsequent encounter: Secondary | ICD-10-CM | POA: Diagnosis not present

## 2019-09-22 DIAGNOSIS — N189 Chronic kidney disease, unspecified: Secondary | ICD-10-CM | POA: Diagnosis not present

## 2019-09-22 DIAGNOSIS — I5032 Chronic diastolic (congestive) heart failure: Secondary | ICD-10-CM | POA: Diagnosis not present

## 2019-09-22 DIAGNOSIS — I130 Hypertensive heart and chronic kidney disease with heart failure and stage 1 through stage 4 chronic kidney disease, or unspecified chronic kidney disease: Secondary | ICD-10-CM | POA: Diagnosis not present

## 2019-09-22 DIAGNOSIS — E876 Hypokalemia: Secondary | ICD-10-CM | POA: Diagnosis not present

## 2019-09-22 DIAGNOSIS — C9000 Multiple myeloma not having achieved remission: Secondary | ICD-10-CM | POA: Diagnosis not present

## 2019-09-22 DIAGNOSIS — T451X5D Adverse effect of antineoplastic and immunosuppressive drugs, subsequent encounter: Secondary | ICD-10-CM | POA: Diagnosis not present

## 2019-09-22 DIAGNOSIS — M1990 Unspecified osteoarthritis, unspecified site: Secondary | ICD-10-CM | POA: Diagnosis not present

## 2019-09-24 ENCOUNTER — Telehealth: Payer: Self-pay | Admitting: *Deleted

## 2019-09-24 NOTE — Telephone Encounter (Signed)
Wife called - patient discharged from hospital last Friday. Very weak still. Has infusion appt tomorrow and wants to know if they should come or cancel it. Also he has appt next week for labs and to see Dr. Irene Limbo. Per Dr. Irene Limbo, move all appointments out 2 weeks.   Contacted Ms. Kyler to inform her of Dr. Grier Mitts plan. Appts for 09/25/19 and 10/02/19 cancelled.  Schedule message sent to r/s lab/MD/Infusion appt for 2 weeks from now. Ms. Malvin verbalized understanding.

## 2019-09-25 ENCOUNTER — Telehealth: Payer: Self-pay | Admitting: Hematology

## 2019-09-25 ENCOUNTER — Ambulatory Visit (INDEPENDENT_AMBULATORY_CARE_PROVIDER_SITE_OTHER): Payer: Medicare Other | Admitting: Internal Medicine

## 2019-09-25 ENCOUNTER — Encounter: Payer: Self-pay | Admitting: Internal Medicine

## 2019-09-25 ENCOUNTER — Inpatient Hospital Stay: Payer: Medicare Other

## 2019-09-25 ENCOUNTER — Other Ambulatory Visit: Payer: Self-pay

## 2019-09-25 DIAGNOSIS — R197 Diarrhea, unspecified: Secondary | ICD-10-CM

## 2019-09-25 DIAGNOSIS — G622 Polyneuropathy due to other toxic agents: Secondary | ICD-10-CM | POA: Diagnosis not present

## 2019-09-25 DIAGNOSIS — I10 Essential (primary) hypertension: Secondary | ICD-10-CM | POA: Diagnosis not present

## 2019-09-25 DIAGNOSIS — I480 Paroxysmal atrial fibrillation: Secondary | ICD-10-CM

## 2019-09-25 DIAGNOSIS — K559 Vascular disorder of intestine, unspecified: Secondary | ICD-10-CM

## 2019-09-25 DIAGNOSIS — I5033 Acute on chronic diastolic (congestive) heart failure: Secondary | ICD-10-CM | POA: Diagnosis not present

## 2019-09-25 DIAGNOSIS — C9000 Multiple myeloma not having achieved remission: Secondary | ICD-10-CM

## 2019-09-25 MED ORDER — B COMPLEX PO TABS: 1.0000 | ORAL_TABLET | Freq: Every day | ORAL | 3 refills | Status: DC

## 2019-09-25 MED ORDER — POLYETHYLENE GLYCOL 3350 17 GM/SCOOP PO POWD: 17.0000 g | Freq: Two times a day (BID) | ORAL | 5 refills | Status: DC | PRN

## 2019-09-25 MED ORDER — GABAPENTIN 300 MG PO CAPS: 300.0000 mg | ORAL_CAPSULE | Freq: Four times a day (QID) | ORAL | 3 refills | Status: DC

## 2019-09-25 NOTE — Patient Instructions (Addendum)
Diabetic socks Faux fur house shoes  Rice heating pad Sports cream

## 2019-09-25 NOTE — Assessment & Plan Note (Addendum)
Currently on Decadron Labs will be repeated in a week at the cancer [facility]

## 2019-09-25 NOTE — Assessment & Plan Note (Signed)
Due to ischemic colitis.  Resolved

## 2019-09-25 NOTE — Assessment & Plan Note (Signed)
Symptoms are resolving.  Continue with good hydration.  He is on Eliquis.

## 2019-09-25 NOTE — Progress Notes (Signed)
Subjective:  Patient ID: [name] [name], male    DOB: 12-Sep-1941  Age: 79 y.o. MRN: [id number]  CC: No chief complaint on file.   HPI [name] presents for a post-hosp f/u.  She was hospitalized for sepsis, ischemic colitis, confusion.  Currently he is complaining of a lot of burning pain in both feet and some tingling in the tips of his fingers.  He has been weak and achy.  His appetite is little better he is.  He is here today with his wife.  It is a very complex case  Per hx:  "Admit date: 09/13/2019 Discharge date: 09/21/2019  Admitted From: Home isposition:  Home  Recommendations for Outpatient Follow-up:  1. Follow up with PCP in 1-2 weeks 2. Please obtain BMP/CBC in one week   Home Health:Yes Equipment/Devices:None  Discharge Condition:Stable CODE STATUS:Full Diet recommendation: Heart Healthy  Brief/Interim Summary: 78 y.o.malepast medical history paroxysmal atrial fibrillation on Eliquis, hypertension chronic diastolic heart failure status post TAVR's in August 2020, with multiple myeloma, where reports that he was diagnosed with bone: Severe on August 2020 followed by Dr.Kalepresents to the hospital complaining of abdominal pain bloody diarrhea, he was admitted for severe sepsis and abdominal pain etiology as well of encephalopathy with C. difficile negative, on 09/15/2018 the patient went into A. fib with RVR with evidence of hypotension cardiology was consulted was started on amiodarone drip and now has stabilized. On 09/16/2018 he developed significant agitation and combativeness.  Discharge Diagnoses:  Principal Problem:   Atrial fibrillation with RVR ([place]) Active Problems:   Essential hypertension   PAF (paroxysmal atrial fibrillation) (HCC)   Acute kidney injury superimposed on CKD (HCC)   S/P TAVR (transcatheter aortic valve replacement)   Multiple myeloma not having achieved remission (HCC)   Bone metastases (HCC)   Peripheral neuropathy  Bloody diarrhea   Abdominal pain, diffuse   Acute metabolic encephalopathy   Goals of care, counseling/discussion   DNR (do not resuscitate)   Pressure injury of skin   Ischemic colitis ([place]) Severe sepsis secondary to ischemic colitis: C. difficile PCR was negative gastrointestinal panel was also negative. He was started empirically on IV vancomycin and cefepime then transition to IV Flagyl and ciprofloxacin he was fluid resuscitated and his blood pressure stabilized. Blood cultures remain negative, he will continue Cipro and Flagyl as an outpatient for total of 10 days.  Acute metabolic encephalopathy: Patient was obtunded on admission after treating his infectious etiology his encephalopathy resolved.  A. fib with RVR: Cardiology was consulted he was cardioverted on 09/18/2018 started on amiodarone drip then transition to oral. He will continue Eliquis as an outpatient.  Acute kidney injury: With a baseline creatinine of 0.1 on admission 2.2 likely prerenal azotemia resolved with IV fluid hydration.  Peripheral neuropathy due to multiple myeloma: He is currently on Zometa acyclovir steroids and Revlimid. We will continue Neurontin he will follow up with his oncologist as an outpatient.  Peripheral neuropathy likely due to chemotherapy versus multiple myeloma: Continue Neurontin and Cymbalta and oxycodone.  Chronic diastolic heart failure: He appeared to be hypovolemic on admission his diuretics were held on admission. These will be resumed as an outpatient Lasix and Aldactone."    Outpatient Medications Prior to Visit  Medication Sig Dispense Refill  . acyclovir (ZOVIRAX) 400 MG tablet Take 1 tablet (400 mg total) by mouth 2 (two) times daily. 180 tablet 3  . amiodarone (PACERONE) 200 MG tablet Take 1 tablet (200 mg total) by mouth 2 (two)  times daily for 9 days. 18 tablet 0  . [START ON 10/01/2019] amiodarone (PACERONE) 200 MG tablet Take 1 tablet (200 mg total) by  mouth daily. 30 tablet 0  . amLODipine (NORVASC) 5 MG tablet Take 1 tablet (5 mg total) by mouth daily. 30 tablet 6  . amLODipine (NORVASC) 5 MG tablet Take 1 tablet (5 mg total) by mouth daily. 90 tablet 3  . Carboxymethylcellul-Glycerin (LUBRICATING EYE DROPS OP) Place 1 drop into both eyes daily as needed (dry eyes).    . Cholecalciferol (VITAMIN D) 50 MCG (2000 UT) tablet Take 2,000 Units by mouth daily.    [name] dexamethasone (DECADRON) 4 MG tablet Take 3 tablets (12 mg total) by mouth once a week. (Patient taking differently: Take 12 mg by mouth every Monday. ) 20 tablet 6  . diclofenac Sodium (VOLTAREN) 1 % GEL Apply 1 application topically 4 (four) times daily as needed (pain).    . DULoxetine (CYMBALTA) 60 MG capsule Take 1 capsule (60 mg total) by mouth daily. 30 capsule 1  . ELIQUIS 5 MG TABS tablet TAKE 1 TABLET BY MOUTH TWICE A DAY (Patient taking differently: Take 5 mg by mouth 2 (two) times daily. ) 60 tablet 6  . ergocalciferol (VITAMIN D2) 1.25 MG (50000 UT) capsule Take 1 capsule (50,000 Units total) by mouth once a week. 12 capsule 3  . famotidine (PEPCID) 40 MG tablet Take 1 tablet (40 mg total) by mouth daily. 30 tablet 11  . fentaNYL (DURAGESIC) 12 MCG/HR Place 1 patch onto the skin every 3 (three) days. 10 patch 0  . furosemide (LASIX) 20 MG tablet Take 1 tablet (20 mg total) by mouth daily. 30 tablet 3  . gabapentin (NEURONTIN) 300 MG capsule Take 1 in the morning and 2 tablet at bedtime. (Patient taking differently: Take 100-200 mg by mouth See admin instructions. Take 100 mg  in the morning and 200 mg at bedtime.) 270 capsule 3  . lenalidomide (REVLIMID) 15 MG capsule Take 1 capsule (15 mg total) by mouth daily. Take for 14 days on, 7 days off, repeat every 21 days. (Patient taking differently: Take 15 mg by mouth See admin instructions. Take 15 mg daily for 14 days on, 7 days off, repeat every 21 days.) 14 capsule 0  . loratadine (CLARITIN) 10 MG tablet Take 1 tablet (10 mg  total) by mouth daily. 30 tablet 11  . LORazepam (ATIVAN) 0.5 MG tablet Take 1 tablet (0.5 mg total) by mouth every 6 (six) hours as needed (Nausea or vomiting). 30 tablet 0  . metoprolol succinate (TOPROL-XL) 25 MG 24 hr tablet Take 1 tablet (25 mg total) by mouth 2 (two) times daily. 60 tablet 6  . ondansetron (ZOFRAN) 8 MG tablet Take 1 tablet (8 mg total) by mouth 2 (two) times daily as needed (Nausea or vomiting). 30 tablet 1  . oxyCODONE (OXY IR/ROXICODONE) 5 MG immediate release tablet Take 1-2 tablets (5-10 mg total) by mouth every 4 (four) hours as needed for moderate pain or severe pain. 120 tablet 0  . potassium chloride SA (KLOR-CON M20) 20 MEQ tablet Take 2 tablets (40 mEq total) by mouth 2 (two) times daily. 120 tablet 1  . prochlorperazine (COMPAZINE) 10 MG tablet Take 1 tablet (10 mg total) by mouth every 6 (six) hours as needed (Nausea or vomiting). 30 tablet 1  . senna-docusate (SENNA S) 8.6-50 MG tablet Take 2 tablets by mouth at bedtime. (Patient taking differently: Take 2 tablets by mouth at bedtime  as needed for mild constipation. ) 60 tablet 2  . spironolactone (ALDACTONE) 25 MG tablet Take 1 tablet (25 mg total) by mouth daily. 30 tablet 3   No facility-administered medications prior to visit.    ROS: Review of Systems  Constitutional: Positive for fatigue and unexpected weight change. Negative for appetite change.  HENT: Negative for congestion, nosebleeds, sneezing, sore throat and trouble swallowing.   Eyes: Negative for itching and visual disturbance.  Respiratory: Negative for cough.   Cardiovascular: Negative for chest pain, palpitations and leg swelling.  Gastrointestinal: Negative for abdominal distention, blood in stool, diarrhea and nausea.  Genitourinary: Negative for frequency and hematuria.  Musculoskeletal: Positive for arthralgias, back pain and gait problem. Negative for joint swelling and neck pain.  Skin: Positive for color change. Negative for rash.   Neurological: Positive for weakness. Negative for dizziness, tremors and speech difficulty.  Hematological: Bruises/bleeds easily.  Psychiatric/Behavioral: Positive for dysphoric mood. Negative for agitation, sleep disturbance and suicidal ideas. The patient is not nervous/anxious.     Objective:  BP (!) 142/96 (BP Location: Left Arm, Patient Position: Sitting, Cuff Size: Normal)   Pulse 70   Temp 98.2 F (36.8 C) (Oral)   Ht '5\' 11"'$  (1.803 m)   Wt 163 lb (73.9 kg)   SpO2 96%   BMI 22.73 kg/m   BP Readings from Last 3 Encounters:  09/25/19 (!) 142/96  09/21/19 (!) 158/90  09/13/19 (!) 146/88    Wt Readings from Last 3 Encounters:  09/25/19 163 lb (73.9 kg)  09/21/19 166 lb 3.6 oz (75.4 kg)  09/13/19 158 lb (71.7 kg)    Physical Exam Constitutional:      General: He is not in acute distress.    Appearance: He is well-developed.     Comments: NAD  Eyes:     Conjunctiva/sclera: Conjunctivae normal.     Pupils: Pupils are equal, round, and reactive to light.  Neck:     Thyroid: No thyromegaly.     Vascular: No JVD.  Cardiovascular:     Rate and Rhythm: Normal rate and regular rhythm.     Heart sounds: Normal heart sounds. No murmur. No friction rub. No gallop.   Pulmonary:     Effort: Pulmonary effort is normal. No respiratory distress.     Breath sounds: Normal breath sounds. No wheezing or rales.  Chest:     Chest wall: No tenderness.  Abdominal:     General: Bowel sounds are normal. There is no distension.     Palpations: Abdomen is soft. There is no mass.     Tenderness: There is no abdominal tenderness. There is no guarding or rebound.  Musculoskeletal:        General: Tenderness present. Normal range of motion.     Cervical back: Normal range of motion.  Lymphadenopathy:     Cervical: No cervical adenopathy.  Skin:    General: Skin is warm and dry.     Findings: No rash.  Neurological:     Mental Status: He is alert and oriented to person, place, and  time.     Cranial Nerves: No cranial nerve deficit.     Motor: No abnormal muscle tone.     Coordination: Coordination abnormal.     Gait: Gait normal.     Deep Tendon Reflexes: Reflexes abnormal.  Psychiatric:        Behavior: Behavior normal.        Thought Content: Thought content normal.  Judgment: Judgment normal.   B feet tender Weak cane  Lab Results  Component Value Date   WBC 7.4 09/20/2019   HGB 11.5 (L) 09/20/2019   HCT 33.5 (L) 09/20/2019   PLT 144 (L) 09/20/2019   GLUCOSE 93 09/21/2019   CHOL 144 08/01/2018   TRIG 167.0 (H) 08/01/2018   HDL 28.20 (L) 08/01/2018   LDLDIRECT 151.5 11/20/2007   LDLCALC 82 08/01/2018   ALT 25 09/16/2019   AST 30 09/16/2019   NA 137 09/21/2019   K 3.1 (L) 09/21/2019   CL 103 09/21/2019   CREATININE 0.69 09/21/2019   BUN 5 (L) 09/21/2019   CO2 23 09/21/2019   TSH 4.76 (H) 10/03/2018   PSA 4.20 (H) 08/01/2018   INR 1.3 (H) 09/19/2019   HGBA1C 5.8 (H) 05/04/2019    CT ABDOMEN PELVIS WO CONTRAST  Result Date: 09/14/2019 CLINICAL DATA:  Abdominal pain. Nausea, vomiting, diarrhea and liquid stool. History of multiple myeloma. EXAM: CT ABDOMEN AND PELVIS WITHOUT CONTRAST TECHNIQUE: Multidetector CT imaging of the abdomen and pelvis was performed following the standard protocol without IV contrast. COMPARISON:  CT angiography 03/14/2019. PET CT 04/06/2019 FINDINGS: Lower chest: Dilated distal esophagus containing enteric contrast. Minimal scarring in the right middle lobe and lingula. Minimal dependent atelectasis in the left lower lobe. No pleural fluid. Hepatobiliary: No focal liver lesion on noncontrast exam. Mild gallbladder distention without calcified gallstone or biliary dilatation. No pericholecystic inflammation. Pancreas: Parenchymal atrophy. No ductal dilatation or inflammation. Spleen: Normal in size without focal abnormality. Adrenals/Urinary Tract: Normal adrenal glands. No hydronephrosis or perinephric edema. Bilateral  renal cysts. Ureters are decompressed. Urinary bladder is partially distended. Bladder displaced anteriorly by a stool ball distending the rectum. Stomach/Bowel: Enteric contrast in the distal esophagus which is dilated. Distended stomach with air and oral contrast. There is no gastric wall thickening. No evidence of gastric outlet obstruction or mass effect. No small bowel obstruction or dilatation, administered enteric contrast reaches the cecum. Appendix not well visualized, no evidence of appendicitis. Small volume of formed stool in the ascending colon, liquid stool in the transverse and descending colon. Stool within tortuous sigmoid colon. Sigmoid colonic wall thickening, series 3, image 62. Large stool ball distends the rectum with rectal distention of 8 cm. Mild perirectal fat stranding. No evidence of pneumatosis or perforation. Vascular/Lymphatic: Aorta bi-iliac atherosclerosis. Decreased size of previous retroperitoneal lymph node anterior to the IVC since prior PET, no longer visualized. No enlarged lymph nodes in the abdomen or pelvis. Reproductive: Prostate is unremarkable. Other: No ascites. No free air. Fat in the left inguinal canal. Musculoskeletal: Multiple bone lesions involving the pelvis, largest lesion with lytic in the right acetabulum. Overall no significant change in size from prior exam. Additional small bone lesions involving ribs. Stable endplate concavity at M35 and T12. No new osseous lesions. IMPRESSION: 1. Large stool ball distending the rectum with rectal distention and mild perirectal fat stranding. Findings consistent with fecal impaction. There is also wall thickening of the adjacent sigmoid colon with mild pericolonic edema. Possibility of stercoral colitis is raised. 2. Findings suggesting gastroparesis with gastric distension with air and contrast, as well as distended distal esophagus that is contrast filled. No evidence of gastric outlet obstruction or mechanical  obstructing lesion. No bowel obstruction, administered enteric contrast reaches the cecum. 3. Decreased size of previous retroperitoneal lymph node since prior PET, no longer visualized. 4. Multiple pelvic bone lesions consistent with history of multiple myeloma, grossly unchanged in size from prior exam. Aortic  Atherosclerosis (ICD10-I70.0). Electronically Signed   By: Keith Rake M.D.   On: 09/14/2019 04:05   CT HEAD WO CONTRAST  Result Date: 09/14/2019 CLINICAL DATA:  Presyncope EXAM: CT HEAD WITHOUT CONTRAST TECHNIQUE: Contiguous axial images were obtained from the base of the skull through the vertex without intravenous contrast. COMPARISON:  MRI 02/17/2017 FINDINGS: Brain: No evidence of acute infarction, hemorrhage, hydrocephalus, extra-axial collection or mass lesion/mass effect. Scattered low-density changes within the periventricular and subcortical white matter compatible with chronic microvascular ischemic change. Mild diffuse cerebral volume loss. Vascular: Mild atherosclerotic calcifications involving the large vessels of the skull base. No unexpected hyperdense vessel. Skull: Normal. Negative for fracture or focal lesion. Sinuses/Orbits: No acute finding. Other: None. IMPRESSION: 1.  No acute intracranial findings. 2.  Chronic microvascular ischemic change and cerebral volume loss. Electronically Signed   By: Davina Poke D.O.   On: 09/14/2019 09:59   DG Chest Port 1 View  Result Date: 09/14/2019 CLINICAL DATA:  Weakness. EXAM: PORTABLE CHEST 1 VIEW COMPARISON:  Radiograph 05/09/2019 FINDINGS: Normal heart size and mediastinal contours. TAVR. No pulmonary edema, focal airspace disease, pleural effusion or pneumothorax. No acute osseous abnormalities are seen. IMPRESSION: No acute chest findings. Electronically Signed   By: Keith Rake M.D.   On: 09/14/2019 02:23    Assessment & Plan:   There are no diagnoses linked to this encounter.   No orders of the defined types were  placed in this encounter.    Follow-up: No follow-ups on file.  [name], MD

## 2019-09-25 NOTE — Assessment & Plan Note (Signed)
Toprol, spironolactone, potassium, amlodipine

## 2019-09-25 NOTE — Assessment & Plan Note (Signed)
He is on Eliquis and Toprol

## 2019-09-25 NOTE — Assessment & Plan Note (Signed)
The symptoms are worse.  Will increase gabapentin to 600 mg 3 times a day.  We can go higher if needed.  Continue with oxycodone as needed and fentanyl.  Diabetic socks.  Warming rice sock.  Massage.  He has been using Voltaren gel

## 2019-09-25 NOTE — Telephone Encounter (Signed)
R/s appt per 1/11 sch message - unable to reach pt-  left message with appt date and time

## 2019-09-26 DIAGNOSIS — C9000 Multiple myeloma not having achieved remission: Secondary | ICD-10-CM | POA: Diagnosis not present

## 2019-09-26 DIAGNOSIS — K559 Vascular disorder of intestine, unspecified: Secondary | ICD-10-CM | POA: Diagnosis not present

## 2019-09-26 DIAGNOSIS — T451X5D Adverse effect of antineoplastic and immunosuppressive drugs, subsequent encounter: Secondary | ICD-10-CM | POA: Diagnosis not present

## 2019-09-26 DIAGNOSIS — I130 Hypertensive heart and chronic kidney disease with heart failure and stage 1 through stage 4 chronic kidney disease, or unspecified chronic kidney disease: Secondary | ICD-10-CM | POA: Diagnosis not present

## 2019-09-26 DIAGNOSIS — G620 Drug-induced polyneuropathy: Secondary | ICD-10-CM | POA: Diagnosis not present

## 2019-09-26 DIAGNOSIS — C7951 Secondary malignant neoplasm of bone: Secondary | ICD-10-CM | POA: Diagnosis not present

## 2019-09-29 DIAGNOSIS — C9000 Multiple myeloma not having achieved remission: Secondary | ICD-10-CM | POA: Diagnosis not present

## 2019-09-29 DIAGNOSIS — C7951 Secondary malignant neoplasm of bone: Secondary | ICD-10-CM | POA: Diagnosis not present

## 2019-09-29 DIAGNOSIS — I130 Hypertensive heart and chronic kidney disease with heart failure and stage 1 through stage 4 chronic kidney disease, or unspecified chronic kidney disease: Secondary | ICD-10-CM | POA: Diagnosis not present

## 2019-09-29 DIAGNOSIS — K559 Vascular disorder of intestine, unspecified: Secondary | ICD-10-CM | POA: Diagnosis not present

## 2019-09-29 DIAGNOSIS — T451X5D Adverse effect of antineoplastic and immunosuppressive drugs, subsequent encounter: Secondary | ICD-10-CM | POA: Diagnosis not present

## 2019-09-29 DIAGNOSIS — G620 Drug-induced polyneuropathy: Secondary | ICD-10-CM | POA: Diagnosis not present

## 2019-09-30 ENCOUNTER — Emergency Department (HOSPITAL_COMMUNITY): Payer: Medicare Other

## 2019-09-30 ENCOUNTER — Encounter (HOSPITAL_COMMUNITY): Payer: Self-pay | Admitting: Emergency Medicine

## 2019-09-30 ENCOUNTER — Other Ambulatory Visit: Payer: Self-pay

## 2019-09-30 ENCOUNTER — Inpatient Hospital Stay (HOSPITAL_COMMUNITY)
Admission: EM | Admit: 2019-09-30 | Discharge: 2019-10-02 | DRG: 193 | Disposition: A | Discharge status: Home Health | Payer: Medicare Other | Attending: Internal Medicine | Admitting: Internal Medicine

## 2019-09-30 DIAGNOSIS — E7800 Pure hypercholesterolemia, unspecified: Secondary | ICD-10-CM | POA: Diagnosis present

## 2019-09-30 DIAGNOSIS — Z888 Allergy status to other drugs, medicaments and biological substances status: Secondary | ICD-10-CM | POA: Diagnosis not present

## 2019-09-30 DIAGNOSIS — F418 Other specified anxiety disorders: Secondary | ICD-10-CM | POA: Diagnosis not present

## 2019-09-30 DIAGNOSIS — Z800 Family history of malignant neoplasm of digestive organs: Secondary | ICD-10-CM | POA: Diagnosis not present

## 2019-09-30 DIAGNOSIS — R0902 Hypoxemia: Secondary | ICD-10-CM | POA: Diagnosis not present

## 2019-09-30 DIAGNOSIS — I130 Hypertensive heart and chronic kidney disease with heart failure and stage 1 through stage 4 chronic kidney disease, or unspecified chronic kidney disease: Secondary | ICD-10-CM | POA: Diagnosis present

## 2019-09-30 DIAGNOSIS — R1313 Dysphagia, pharyngeal phase: Secondary | ICD-10-CM | POA: Diagnosis present

## 2019-09-30 DIAGNOSIS — I2510 Atherosclerotic heart disease of native coronary artery without angina pectoris: Secondary | ICD-10-CM | POA: Diagnosis present

## 2019-09-30 DIAGNOSIS — I5032 Chronic diastolic (congestive) heart failure: Secondary | ICD-10-CM | POA: Diagnosis present

## 2019-09-30 DIAGNOSIS — N400 Enlarged prostate without lower urinary tract symptoms: Secondary | ICD-10-CM | POA: Diagnosis present

## 2019-09-30 DIAGNOSIS — R5381 Other malaise: Secondary | ICD-10-CM

## 2019-09-30 DIAGNOSIS — I480 Paroxysmal atrial fibrillation: Secondary | ICD-10-CM | POA: Diagnosis present

## 2019-09-30 DIAGNOSIS — Z953 Presence of xenogenic heart valve: Secondary | ICD-10-CM | POA: Diagnosis not present

## 2019-09-30 DIAGNOSIS — J9601 Acute respiratory failure with hypoxia: Secondary | ICD-10-CM | POA: Diagnosis present

## 2019-09-30 DIAGNOSIS — G620 Drug-induced polyneuropathy: Secondary | ICD-10-CM | POA: Diagnosis present

## 2019-09-30 DIAGNOSIS — K3184 Gastroparesis: Secondary | ICD-10-CM | POA: Diagnosis present

## 2019-09-30 DIAGNOSIS — I350 Nonrheumatic aortic (valve) stenosis: Secondary | ICD-10-CM | POA: Diagnosis present

## 2019-09-30 DIAGNOSIS — C9000 Multiple myeloma not having achieved remission: Secondary | ICD-10-CM | POA: Diagnosis present

## 2019-09-30 DIAGNOSIS — E785 Hyperlipidemia, unspecified: Secondary | ICD-10-CM | POA: Diagnosis present

## 2019-09-30 DIAGNOSIS — I712 Thoracic aortic aneurysm, without rupture: Secondary | ICD-10-CM | POA: Diagnosis present

## 2019-09-30 DIAGNOSIS — Z20822 Contact with and (suspected) exposure to covid-19: Secondary | ICD-10-CM | POA: Diagnosis present

## 2019-09-30 DIAGNOSIS — Z7901 Long term (current) use of anticoagulants: Secondary | ICD-10-CM | POA: Diagnosis not present

## 2019-09-30 DIAGNOSIS — J189 Pneumonia, unspecified organism: Secondary | ICD-10-CM | POA: Diagnosis present

## 2019-09-30 DIAGNOSIS — R918 Other nonspecific abnormal finding of lung field: Secondary | ICD-10-CM | POA: Diagnosis not present

## 2019-09-30 DIAGNOSIS — R52 Pain, unspecified: Secondary | ICD-10-CM | POA: Diagnosis not present

## 2019-09-30 DIAGNOSIS — K760 Fatty (change of) liver, not elsewhere classified: Secondary | ICD-10-CM | POA: Diagnosis present

## 2019-09-30 DIAGNOSIS — M353 Polymyalgia rheumatica: Secondary | ICD-10-CM | POA: Diagnosis present

## 2019-09-30 DIAGNOSIS — T451X5A Adverse effect of antineoplastic and immunosuppressive drugs, initial encounter: Secondary | ICD-10-CM | POA: Diagnosis present

## 2019-09-30 DIAGNOSIS — Z8249 Family history of ischemic heart disease and other diseases of the circulatory system: Secondary | ICD-10-CM

## 2019-09-30 DIAGNOSIS — Z66 Do not resuscitate: Secondary | ICD-10-CM | POA: Diagnosis present

## 2019-09-30 DIAGNOSIS — R0602 Shortness of breath: Secondary | ICD-10-CM | POA: Diagnosis not present

## 2019-09-30 DIAGNOSIS — N179 Acute kidney failure, unspecified: Secondary | ICD-10-CM | POA: Diagnosis not present

## 2019-09-30 DIAGNOSIS — R531 Weakness: Secondary | ICD-10-CM | POA: Diagnosis not present

## 2019-09-30 DIAGNOSIS — R110 Nausea: Secondary | ICD-10-CM | POA: Diagnosis not present

## 2019-09-30 DIAGNOSIS — M1990 Unspecified osteoarthritis, unspecified site: Secondary | ICD-10-CM | POA: Diagnosis present

## 2019-09-30 DIAGNOSIS — R42 Dizziness and giddiness: Secondary | ICD-10-CM | POA: Diagnosis not present

## 2019-09-30 DIAGNOSIS — N189 Chronic kidney disease, unspecified: Secondary | ICD-10-CM | POA: Diagnosis present

## 2019-09-30 DIAGNOSIS — K219 Gastro-esophageal reflux disease without esophagitis: Secondary | ICD-10-CM | POA: Diagnosis present

## 2019-09-30 LAB — CBC
HCT: 41.2 % (ref 39.0–52.0)
Hemoglobin: 13 g/dL (ref 13.0–17.0)
MCH: 30.2 pg (ref 26.0–34.0)
MCHC: 31.6 g/dL (ref 30.0–36.0)
MCV: 95.8 fL (ref 80.0–100.0)
Platelets: 161 10*3/uL (ref 150–400)
RBC: 4.3 MIL/uL (ref 4.22–5.81)
RDW: 14 % (ref 11.5–15.5)
WBC: 12 10*3/uL — ABNORMAL HIGH (ref 4.0–10.5)
nRBC: 0 % (ref 0.0–0.2)

## 2019-09-30 LAB — CBG MONITORING, ED: Glucose-Capillary: 102 mg/dL — ABNORMAL HIGH (ref 70–99)

## 2019-09-30 LAB — BASIC METABOLIC PANEL
Anion gap: 10 (ref 5–15)
BUN: 17 mg/dL (ref 8–23)
CO2: 30 mmol/L (ref 22–32)
Calcium: 9.3 mg/dL (ref 8.9–10.3)
Chloride: 97 mmol/L — ABNORMAL LOW (ref 98–111)
Creatinine, Ser: 1.22 mg/dL (ref 0.61–1.24)
GFR calc Af Amer: >60 mL/min (ref >60)
GFR calc non Af Amer: 56 mL/min — ABNORMAL LOW (ref >60)
Glucose, Bld: 119 mg/dL — ABNORMAL HIGH (ref 70–99)
Potassium: 4.6 mmol/L (ref 3.5–5.1)
Sodium: 137 mmol/L (ref 135–145)

## 2019-09-30 LAB — URINALYSIS, ROUTINE W REFLEX MICROSCOPIC
Bacteria, UA: NONE SEEN
Bilirubin Urine: NEGATIVE
Glucose, UA: NEGATIVE mg/dL
Ketones, ur: NEGATIVE mg/dL
Leukocytes,Ua: NEGATIVE
Nitrite: NEGATIVE
Protein, ur: NEGATIVE mg/dL
Specific Gravity, Urine: 1.01 (ref 1.005–1.030)
pH: 8 (ref 5.0–8.0)

## 2019-09-30 LAB — RESPIRATORY PANEL BY RT PCR (FLU A&B, COVID)
Influenza A by PCR: NEGATIVE
Influenza B by PCR: NEGATIVE
SARS Coronavirus 2 by RT PCR: NEGATIVE

## 2019-09-30 MED ORDER — AMIODARONE HCL 200 MG PO TABS: 200.0000 mg | ORAL_TABLET | Freq: Every day | ORAL | Status: DC

## 2019-09-30 MED ORDER — SODIUM CHLORIDE 0.9 % IV SOLN
3.0000 g | Freq: Four times a day (QID) | INTRAVENOUS | Status: DC
  Administered 2019-09-30 – 2019-10-02 (×7): 3 g via INTRAVENOUS
  Filled 2019-09-30: qty 8
  Filled 2019-09-30 (×4): qty 3
  Filled 2019-09-30 (×3): qty 8
  Filled 2019-09-30 (×2): qty 3

## 2019-09-30 MED ORDER — OXYCODONE HCL 5 MG PO TABS
5.0000 mg | ORAL_TABLET | ORAL | Status: DC | PRN
  Administered 2019-09-30: 5 mg via ORAL
  Administered 2019-10-01 – 2019-10-02 (×3): 10 mg via ORAL
  Filled 2019-09-30: qty 2
  Filled 2019-09-30: qty 1
  Filled 2019-09-30 (×2): qty 2

## 2019-09-30 MED ORDER — ONDANSETRON HCL 4 MG PO TABS: 8.0000 mg | ORAL_TABLET | Freq: Two times a day (BID) | ORAL | Status: DC | PRN

## 2019-09-30 MED ORDER — METOPROLOL SUCCINATE ER 25 MG PO TB24
25.0000 mg | ORAL_TABLET | Freq: Two times a day (BID) | ORAL | Status: DC
  Administered 2019-09-30 – 2019-10-01 (×3): 25 mg via ORAL
  Filled 2019-09-30 (×4): qty 1

## 2019-09-30 MED ORDER — FUROSEMIDE 20 MG PO TABS
20.0000 mg | ORAL_TABLET | Freq: Every day | ORAL | Status: DC
  Administered 2019-09-30 – 2019-10-02 (×3): 20 mg via ORAL
  Filled 2019-09-30 (×3): qty 1

## 2019-09-30 MED ORDER — VITAMIN B-1 50 MG PO TABS
50.0000 mg | ORAL_TABLET | Freq: Every day | ORAL | Status: DC
  Administered 2019-10-01 – 2019-10-02 (×2): 50 mg via ORAL
  Filled 2019-09-30 (×3): qty 1

## 2019-09-30 MED ORDER — PANTOPRAZOLE SODIUM 40 MG PO TBEC
40.0000 mg | DELAYED_RELEASE_TABLET | Freq: Every day | ORAL | Status: DC
  Administered 2019-09-30 – 2019-10-02 (×3): 40 mg via ORAL
  Filled 2019-09-30 (×3): qty 1

## 2019-09-30 MED ORDER — PHENAZOPYRIDINE HCL 200 MG PO TABS
200.0000 mg | ORAL_TABLET | Freq: Three times a day (TID) | ORAL | Status: AC
  Administered 2019-09-30 – 2019-10-02 (×6): 200 mg via ORAL
  Filled 2019-09-30 (×5): qty 1
  Filled 2019-09-30: qty 2
  Filled 2019-09-30: qty 1

## 2019-09-30 MED ORDER — SPIRONOLACTONE 25 MG PO TABS
25.0000 mg | ORAL_TABLET | Freq: Every day | ORAL | Status: DC
  Administered 2019-10-01 – 2019-10-02 (×2): 25 mg via ORAL
  Filled 2019-09-30 (×3): qty 1

## 2019-09-30 MED ORDER — SENNOSIDES-DOCUSATE SODIUM 8.6-50 MG PO TABS
2.0000 | ORAL_TABLET | Freq: Every evening | ORAL | Status: DC | PRN
  Filled 2019-09-30: qty 2

## 2019-09-30 MED ORDER — LORAZEPAM 0.5 MG PO TABS
0.5000 mg | ORAL_TABLET | Freq: Four times a day (QID) | ORAL | Status: DC | PRN
  Administered 2019-10-01: 0.5 mg via ORAL
  Filled 2019-09-30: qty 1

## 2019-09-30 MED ORDER — SODIUM CHLORIDE 0.9 % IV BOLUS
1000.0000 mL | Freq: Once | INTRAVENOUS | Status: AC
  Administered 2019-09-30: 1000 mL via INTRAVENOUS

## 2019-09-30 MED ORDER — DEXAMETHASONE 4 MG PO TABS
4.0000 mg | ORAL_TABLET | Freq: Once | ORAL | Status: AC
  Administered 2019-10-01: 4 mg via ORAL
  Filled 2019-09-30: qty 1

## 2019-09-30 MED ORDER — ACETAMINOPHEN 650 MG RE SUPP: 650.0000 mg | Freq: Four times a day (QID) | RECTAL | Status: DC | PRN

## 2019-09-30 MED ORDER — PROCHLORPERAZINE MALEATE 10 MG PO TABS
10.0000 mg | ORAL_TABLET | Freq: Four times a day (QID) | ORAL | Status: DC | PRN
  Filled 2019-09-30: qty 1

## 2019-09-30 MED ORDER — SODIUM CHLORIDE 0.9% FLUSH: 3.0000 mL | Freq: Once | INTRAVENOUS | Status: DC

## 2019-09-30 MED ORDER — GABAPENTIN 100 MG PO CAPS
200.0000 mg | ORAL_CAPSULE | Freq: Three times a day (TID) | ORAL | Status: DC
  Administered 2019-09-30 – 2019-10-02 (×5): 200 mg via ORAL
  Filled 2019-09-30 (×5): qty 2

## 2019-09-30 MED ORDER — HYPROMELLOSE (GONIOSCOPIC) 2.5 % OP SOLN
1.0000 [drp] | Freq: Every day | OPHTHALMIC | Status: DC | PRN
  Filled 2019-09-30: qty 15

## 2019-09-30 MED ORDER — SODIUM CHLORIDE 0.9 % IV SOLN
3.0000 g | Freq: Four times a day (QID) | INTRAVENOUS | Status: DC
  Filled 2019-09-30 (×3): qty 8

## 2019-09-30 MED ORDER — LORATADINE 10 MG PO TABS
10.0000 mg | ORAL_TABLET | Freq: Every day | ORAL | Status: DC
  Administered 2019-09-30 – 2019-10-02 (×3): 10 mg via ORAL
  Filled 2019-09-30 (×3): qty 1

## 2019-09-30 MED ORDER — ACYCLOVIR 400 MG PO TABS
400.0000 mg | ORAL_TABLET | Freq: Two times a day (BID) | ORAL | Status: DC
  Administered 2019-09-30 – 2019-10-02 (×4): 400 mg via ORAL
  Filled 2019-09-30 (×5): qty 1

## 2019-09-30 MED ORDER — FAMOTIDINE 20 MG PO TABS
40.0000 mg | ORAL_TABLET | Freq: Every day | ORAL | Status: DC
  Administered 2019-09-30 – 2019-10-02 (×3): 40 mg via ORAL
  Filled 2019-09-30 (×4): qty 2

## 2019-09-30 MED ORDER — POLYETHYLENE GLYCOL 3350 17 GM/SCOOP PO POWD
17.0000 g | Freq: Two times a day (BID) | ORAL | Status: DC | PRN
  Filled 2019-09-30: qty 255

## 2019-09-30 MED ORDER — VITAMIN D 25 MCG (1000 UNIT) PO TABS
2000.0000 [IU] | ORAL_TABLET | Freq: Every day | ORAL | Status: DC
  Administered 2019-10-01 – 2019-10-02 (×2): 2000 [IU] via ORAL
  Filled 2019-09-30 (×2): qty 2

## 2019-09-30 MED ORDER — IOHEXOL 350 MG/ML SOLN
100.0000 mL | Freq: Once | INTRAVENOUS | Status: AC | PRN
  Administered 2019-09-30: 66 mL via INTRAVENOUS

## 2019-09-30 MED ORDER — ACETAMINOPHEN 325 MG PO TABS: 650.0000 mg | ORAL_TABLET | Freq: Four times a day (QID) | ORAL | Status: DC | PRN

## 2019-09-30 MED ORDER — APIXABAN 5 MG PO TABS
5.0000 mg | ORAL_TABLET | Freq: Two times a day (BID) | ORAL | Status: DC
  Administered 2019-09-30 – 2019-10-02 (×4): 5 mg via ORAL
  Filled 2019-09-30 (×5): qty 1

## 2019-09-30 MED ORDER — DULOXETINE HCL 60 MG PO CPEP
60.0000 mg | ORAL_CAPSULE | Freq: Every day | ORAL | Status: DC
  Administered 2019-10-01 – 2019-10-02 (×2): 60 mg via ORAL
  Filled 2019-09-30 (×3): qty 1

## 2019-09-30 NOTE — ED Triage Notes (Signed)
Pt arrives via [facility] from home for c/o polyuria and generalized weakness with difficulty ambulating x2 days. pts wife also reported pt has been more lethargic, a/ox4. EMS VS: 124/66, HR 70, RR 18, 100% on 2L, 92% on RA, 99.6 temp. Hx of Multiple myeloma which he is receiving chemo for, recent hospitalization at beg of the month

## 2019-09-30 NOTE — ED Notes (Signed)
Pt transported to CT ?

## 2019-09-30 NOTE — H&P (Signed)
History and Physical    [name] [name] OIB:[id number] DOB: 19-Jan-1941 DOA: 09/30/2019  PCP: Cassandria Anger, MD (  Patient coming from: Feeling weak  I have personally briefly reviewed patient's old medical records in [place]  Chief Complaint: Polyuria and weakness of his legs  HPI: [name] [name] is a 79 y.o. male with medical history significant of A. Fib on eliquis, multiple myeloma, ascending aortic aneurysm, hypertension, hyperlipidemia, moderate aortic stenosis, chronic diastolic heart failure status post TAVR's in August 2020 presented with polyuria and generalized weakness for 2 days. Patient was recently hospitalized for sepsis from colitis, and developed afib with RVR during admission and cardioverted. Since discharge, his legs are weak but he has been able to walk with minimal assistance with cane. Yesterday not stable on his feet needed maximum assistance. Also constipated but did have had normal bowel movement yesterday. Decreased PO intake. No oxygen at home. Checking temperature and no fevers at home. He has had dysuria and urinary frequency x 2 days.  ED Course: Patient was found to have hypoxia 88% on room air, chest x-ray showed questionable nodules on the right lung, CT angiogram negative for PE but showing right lower lobe infiltrates.  Review of Systems: As per HPI otherwise 10 point review of systems negative.    Past Medical History:  Diagnosis Date  . Ascending aortic aneurysm ([place])   . Bicuspid aortic valve   . Cancer ([place])   . CHF NYHA class I (no symptoms from ordinary activities), acute, diastolic ([place])   . Fatty liver    mild  . GI bleeding 07/21/2018   post polypectomy  . Hemorrhoids   . HTN (hypertension)   . Hypercholesteremia   . Hypokalemia   . Internal hemorrhoids   . LBP (low back pain)   . Moderate aortic stenosis   . Osteoarthritis   . Paroxysmal atrial fibrillation ([place])    a. new onset Afib in 07/2008. He underwent ibutilide  cardioversion successfully. b. Recurrence 01/2013 s/p TEE/DCCV - was on Xarelto but he stopped it as he was convinced it was causing joint pn. c. Recurrence 01/2016 - spont conv to NSR. Pt took Eliquis x1 mo then declined further anticoag. d. Recurrence 07/2016.  [name] Pneumonia   . Tubular adenoma of colon     Past Surgical History:  Procedure Laterality Date  . BACK SURGERY  x12 years ago  . CARDIOVERSION N/A 01/26/2013   Procedure: CARDIOVERSION;  Surgeon: Larey Dresser, MD;  Location[facility] ENDOSCOPY;  Service: Cardiovascular;  Laterality: N/A;  . CARDIOVERSION N/A 10/28/2017   Procedure: CARDIOVERSION;  Surgeon: Larey Dresser, MD;  Location: [facility] ENDOSCOPY;  Service: Cardiovascular;  Laterality: N/A;  . CARDIOVERSION N/A 03/03/2018   Procedure: CARDIOVERSION;  Surgeon: Lelon Perla, MD;  Location: [facility] ENDOSCOPY;  Service: Cardiovascular;  Laterality: N/A;  . CARDIOVERSION N/A 09/19/2019   Procedure: CARDIOVERSION;  Surgeon: Larey Dresser, MD;  Location: [facility] ENDOSCOPY;  Service: Cardiovascular;  Laterality: N/A;  . COLONOSCOPY    . COLONOSCOPY  07/17/2018   at [facility]  . HEMORRHOID SURGERY    . LUMBAR LAMINECTOMY    . POLYPECTOMY    . RIGHT HEART CATH N/A 08/20/2019   Procedure: RIGHT HEART CATH;  Surgeon: Larey Dresser, MD;  Location: [place] CV LAB;  Service: Cardiovascular;  Laterality: N/A;  . RIGHT/LEFT HEART CATH AND CORONARY ANGIOGRAPHY N/A 03/07/2019   Procedure: RIGHT/LEFT HEART CATH AND CORONARY ANGIOGRAPHY;  Surgeon: Burnell Blanks, MD;  Location:  [place] INVASIVE CV LAB;  Service: Cardiovascular;  Laterality: N/A;  . [name]  04/2019  . TEE WITHOUT CARDIOVERSION N/A 01/26/2013   Procedure: TRANSESOPHAGEAL ECHOCARDIOGRAM (TEE);  Surgeon: Larey Dresser, MD;  Location: [place];  Service: Cardiovascular;  Laterality: N/A;  . TEE WITHOUT CARDIOVERSION N/A 10/28/2017   Procedure: TRANSESOPHAGEAL ECHOCARDIOGRAM (TEE);  Surgeon: Larey Dresser, MD;  Location: [facility] ENDOSCOPY;   Service: Cardiovascular;  Laterality: N/A;  . TEE WITHOUT CARDIOVERSION N/A 05/08/2019   Procedure: TRANSESOPHAGEAL ECHOCARDIOGRAM (TEE);  Surgeon: Burnell Blanks, MD;  Location: [place] CV LAB;  Service: Open Heart Surgery;  Laterality: N/A;  . TEE WITHOUT CARDIOVERSION N/A 09/19/2019   Procedure: TRANSESOPHAGEAL ECHOCARDIOGRAM (TEE);  Surgeon: Larey Dresser, MD;  Location: [facility] ENDOSCOPY;  Service: Cardiovascular;  Laterality: N/A;  . TRANSCATHETER AORTIC VALVE REPLACEMENT, TRANSFEMORAL N/A 05/08/2019   Procedure: TRANSCATHETER AORTIC VALVE REPLACEMENT, TRANSFEMORAL;  Surgeon: Burnell Blanks, MD;  Location: [place] CV LAB;  Service: Open Heart Surgery;  Laterality: N/A;     reports that he has never smoked. He has never used smokeless tobacco. He reports current alcohol use. He reports that he does not use drugs.  Allergies  Allergen Reactions  . Xarelto [Rivaroxaban] Other (See Comments) and Hypertension    INCREASED BP-HYPERTENSIVE EVENTS  . Ambien [Zolpidem]     Hallucinations   . Corticosteroids Other (See Comments)    Made the patient  "sick," feel "weird," and his "body rejected" them   . Ramipril Other (See Comments)    Could not eat or sleep, lost muscle mass  . Benazepril Cough    Family History  Problem Relation Age of Onset  . Colon cancer Mother 29  . Hypertension Other   . Coronary artery disease Neg Hx   . Colon polyps Neg Hx   . Esophageal cancer Neg Hx   . Rectal cancer Neg Hx   . Stomach cancer Neg Hx     Prior to Admission medications   Medication Sig Start Date End Date Taking? Authorizing Provider  acyclovir (ZOVIRAX) 400 MG tablet Take 1 tablet (400 mg total) by mouth 2 (two) times daily. 08/15/19  Yes Brunetta Genera, MD  amiodarone (PACERONE) 200 MG tablet Take 1 tablet (200 mg total) by mouth 2 (two) times daily for 9 days. 09/21/19 09/30/19 Yes Charlynne Cousins, MD  amLODipine (NORVASC) 5 MG tablet Take 1 tablet (5 mg  total) by mouth daily. 09/21/19 09/15/20 Yes Charlynne Cousins, MD  b complex vitamins tablet Take 1 tablet by mouth daily. 09/25/19  Yes [name], [name], MD  Calcium-Magnesium 500-250 MG TABS Take 1 tablet by mouth daily.   Yes [provider]  Carboxymethylcellul-Glycerin (LUBRICATING EYE DROPS OP) Place 1 drop into both eyes daily as needed (dry eyes).   Yes [provider]  Cholecalciferol (VITAMIN D) 50 MCG (2000 UT) tablet Take 2,000 Units by mouth daily.   Yes [provider]  dexamethasone (DECADRON) 4 MG tablet Take 3 tablets (12 mg total) by mouth once a week. Patient taking differently: Take 12 mg by mouth every Monday.  06/12/19  Yes Brunetta Genera, MD  diclofenac Sodium (VOLTAREN) 1 % GEL Apply 1 application topically 4 (four) times daily as needed (pain).   Yes [provider]  DULoxetine (CYMBALTA) 60 MG capsule Take 1 capsule (60 mg total) by mouth daily. 08/15/19  Yes Kale, Cloria Spring, MD  ELIQUIS 5 MG TABS tablet TAKE 1 TABLET BY MOUTH TWICE A  DAY Patient taking differently: Take 5 mg by mouth 2 (two) times daily.  07/09/19  Yes Larey Dresser, MD  ergocalciferol (VITAMIN D2) 1.25 MG (50000 UT) capsule Take 1 capsule (50,000 Units total) by mouth once a week. 07/03/19  Yes Brunetta Genera, MD  famotidine (PEPCID) 40 MG tablet Take 1 tablet (40 mg total) by mouth daily. 01/29/19  Yes [name], [name], MD  fentaNYL (DURAGESIC) 12 MCG/HR Place 1 patch onto the skin every 3 (three) days. 08/15/19  Yes Brunetta Genera, MD  furosemide (LASIX) 20 MG tablet Take 1 tablet (20 mg total) by mouth daily. 09/21/19  Yes Charlynne Cousins, MD  gabapentin (NEURONTIN) 300 MG capsule Take 1-2 capsules (300-600 mg total) by mouth 4 (four) times daily. Patient taking differently: Take 300-600 mg by mouth 4 (four) times daily. Take one tablet by mouth in the morning and two tablets by mouth at night per spouse 09/25/19  Yes [name], [name], MD  loratadine (CLARITIN) 10 MG tablet Take 1 tablet (10 mg total) by mouth daily. 12/11/18  Yes [name], [name], MD  LORazepam (ATIVAN) 0.5 MG tablet Take 1 tablet (0.5 mg total) by mouth every 6 (six) hours as needed (Nausea or vomiting). 05/17/19  Yes Brunetta Genera, MD  metoprolol succinate (TOPROL-XL) 25 MG 24 hr tablet Take 1 tablet (25 mg total) by mouth 2 (two) times daily. 09/21/19  Yes Charlynne Cousins, MD  ondansetron (ZOFRAN) 8 MG tablet Take 1 tablet (8 mg total) by mouth 2 (two) times daily as needed (Nausea or vomiting). 05/17/19  Yes Brunetta Genera, MD  oxyCODONE (OXY IR/ROXICODONE) 5 MG immediate release tablet Take 1-2 tablets (5-10 mg total) by mouth every 4 (four) hours as needed for moderate pain or severe pain. 08/15/19  Yes Brunetta Genera, MD  polyethylene glycol powder (GLYCOLAX/MIRALAX) 17 GM/SCOOP powder Take 17-34 g by mouth 2 (two) times daily as needed for moderate constipation. 09/25/19  Yes [name], [name], MD  potassium chloride SA (KLOR-CON M20) 20 MEQ tablet Take 2 tablets (40 mEq total) by mouth 2 (two) times daily. 08/15/19  Yes Brunetta Genera, MD  prochlorperazine (COMPAZINE) 10 MG tablet Take 1 tablet (10 mg total) by mouth every 6 (six) hours as needed (Nausea or vomiting). 05/17/19  Yes Brunetta Genera, MD  senna-docusate (SENNA S) 8.6-50 MG tablet Take 2 tablets by mouth at bedtime. Patient taking differently: Take 2 tablets by mouth at bedtime as needed for mild constipation.  06/19/19  Yes Brunetta Genera, MD  spironolactone (ALDACTONE) 25 MG tablet Take 1 tablet (25 mg total) by mouth daily. 09/21/19  Yes Charlynne Cousins, MD  amiodarone (PACERONE) 200 MG tablet Take 1 tablet (200 mg total) by mouth daily. Patient not taking: Reported on 09/30/2019 10/01/19   Charlynne Cousins, MD  lenalidomide (REVLIMID) 15 MG capsule Take 1 capsule (15 mg total) by mouth daily. Take for 14 days on, 7 days off, repeat every 21  days. Patient not taking: Reported on 09/30/2019 07/05/19   Brunetta Genera, MD    Physical Exam: Vitals:   09/30/19 1600 09/30/19 1630 09/30/19 1700 09/30/19 1730  BP: 102/67 109/62 115/71 106/61  Pulse:    (!) 56  Resp: _0 Temp:      SpO2:    98%    Constitutional: NAD, calm, comfortable Vitals:   09/30/19 1600 09/30/19 1630 09/30/19 1700 09/30/19 1730  BP: 102/67 109/62 115/71  106/61  Pulse:    (!) 56  Resp: _0 Temp:      SpO2:    98%   Eyes: PERRL, lids and conjunctivae normal ENMT: Mucous membranes are moist. Posterior pharynx clear of any exudate or lesions.Normal dentition.  Neck: normal, supple, no masses, no thyromegaly Respiratory: Positive right lower field crackles. Normal respiratory effort. No accessory muscle use.  Cardiovascular: Regular rate and rhythm, no murmurs / rubs / gallops. 2+ pitting edema on bilateral ankles, 2+ pedal pulses. No carotid bruits.  Abdomen: no tenderness, no masses palpated. No hepatosplenomegaly. Bowel sounds positive.  Musculoskeletal: no clubbing / cyanosis. No joint deformity upper and lower extremities. Good ROM, no contractures. Normal muscle tone.  Skin: no rashes, lesions, ulcers. No induration Neurologic: CN 2-12 grossly intact. Sensation intact, DTR normal. Strength 5/5 in all 4.  Psychiatric: Normal judgment and insight. Alert and oriented x 3. Normal mood.    Labs on Admission: I have personally reviewed following labs and imaging studies  CBC: Recent Labs  Lab 09/30/19 1050  WBC 12.0*  HGB 13.0  HCT 41.2  MCV 95.8  PLT 553   Basic Metabolic Panel: Recent Labs  Lab 09/30/19 1050  NA 137  K 4.6  CL 97*  CO2 30  GLUCOSE 119*  BUN 17  CREATININE 1.22  CALCIUM 9.3   GFR: Estimated Creatinine Clearance: 51.3 mL/min (by C-G formula based on SCr of 1.22 mg/dL). Liver Function Tests: No results for input(s): AST, ALT, ALKPHOS, BILITOT, PROT, ALBUMIN in the last 168 hours. No results  for input(s): LIPASE, AMYLASE in the last 168 hours. No results for input(s): AMMONIA in the last 168 hours. Coagulation Profile: No results for input(s): INR, PROTIME in the last 168 hours. Cardiac Enzymes: No results for input(s): CKTOTAL, CKMB, CKMBINDEX, TROPONINI in the last 168 hours. BNP (last 3 results) No results for input(s): PROBNP in the last 8760 hours. HbA1C: No results for input(s): HGBA1C in the last 72 hours. CBG: Recent Labs  Lab 09/30/19 1140  GLUCAP 102*   Lipid Profile: No results for input(s): CHOL, HDL, LDLCALC, TRIG, CHOLHDL, LDLDIRECT in the last 72 hours. Thyroid Function Tests: No results for input(s): TSH, T4TOTAL, FREET4, T3FREE, THYROIDAB in the last 72 hours. Anemia Panel: No results for input(s): VITAMINB12, FOLATE, FERRITIN, TIBC, IRON, RETICCTPCT in the last 72 hours. Urine analysis:    Component Value Date/Time   COLORURINE YELLOW 09/30/2019 1217   APPEARANCEUR HAZY (A) 09/30/2019 1217   LABSPEC 1.010 09/30/2019 1217   PHURINE 8.0 09/30/2019 1217   GLUCOSEU NEGATIVE 09/30/2019 1217   GLUCOSEU NEGATIVE 11/19/2015 1352   HGBUR SMALL (A) 09/30/2019 1217   BILIRUBINUR NEGATIVE 09/30/2019 1217   KETONESUR NEGATIVE 09/30/2019 1217   PROTEINUR NEGATIVE 09/30/2019 1217   UROBILINOGEN 0.2 11/19/2015 1352   NITRITE NEGATIVE 09/30/2019 1217   LEUKOCYTESUR NEGATIVE 09/30/2019 1217    Radiological Exams on Admission: CT Angio Chest PE W/Cm &/Or Wo Cm  Result Date: 09/30/2019 CLINICAL DATA:  Hypoxia, shortness of breath EXAM: CT ANGIOGRAPHY CHEST WITH CONTRAST TECHNIQUE: Multidetector CT imaging of the chest was performed using the standard protocol during bolus administration of intravenous contrast. Multiplanar CT image reconstructions and MIPs were obtained to evaluate the vascular anatomy. CONTRAST:  61m OMNIPAQUE IOHEXOL 350 MG/ML SOLN COMPARISON:  04/06/2019 FINDINGS: Cardiovascular: Aortic Root: --Valve: 3.4 cm --Sinuses: 3.4 cm --Sinotubular  Junction: 3.4 cm Limitations by motion: Mild Thoracic Aorta: --Ascending Aorta: 4.5 cm (stable since 03/14/2019) --Aortic Arch: 3.2  cm (proximal) --Descending Aorta: 3.3 cm Other: Heart size upper limits normal. Small pericardial effusion. Satisfactory opacification of pulmonary arteries noted, and there is no evidence of pulmonary emboli. Scattered coronary calcifications. Interval TAVR. Mediastinum/Nodes: No hilar or mediastinal adenopathy. Fluid distends the esophagus. Lungs/Pleura: No pleural effusion. No pneumothorax. Patchy airspace opacities in the posterior segment right upper lobe, throughout the right lower lobe, and to a minimal degree in the posterior left lung base, new since previous. Upper Abdomen: No acute findings. Musculoskeletal: Anterior vertebral endplate spurring at multiple levels in the mid and lower thoracic spine. T9 and T12 compression deformities. Review of the MIP images confirms the above findings. IMPRESSION: 1. Negative for acute PE or thoracic aortic dissection. 2. New patchy airspace opacities predominantly in the right lower lobe, also seen in the posterior upper lobe and to a minimal degree in the posterior left lung base, likely infectious/inflammatory. 3. Fluid distends the esophagus suggesting reflux disease or dysmotility. 4. Stable 4.5 cm ascending thoracic aortic aneurysm. Recommend semi-annual imaging followup by CTA or MRA and referral to cardiothoracic surgery if not already obtained. This recommendation follows 2010 ACCF/AHA/AATS/ACR/ASA/SCA/SCAI/SIR/STS/SVM Guidelines for the Diagnosis and Management of Patients With Thoracic Aortic Disease. Circulation. 2010; 121: A834-H962 Electronically Signed   By: Lucrezia Europe M.D.   On: 09/30/2019 14:46   DG Chest Portable 1 View  Result Date: 09/30/2019 CLINICAL DATA:  79 year old male with history of hypoxia. Polyuria. Generalized weakness. EXAM: PORTABLE CHEST 1 VIEW COMPARISON:  Chest x-ray 09/14/2019. FINDINGS: Elongated  nodular density projecting over the right upper lobe, new compared to the prior study. No pleural effusions. No evidence of pulmonary edema. No pneumothorax. Heart size is normal. Upper mediastinal contours are within normal limits. Status post transcatheter aortic valve replacement. IMPRESSION: 1. New elongated nodular density projecting over the region of the right upper lobe, presumably infectious or inflammatory in etiology given the rapid development over the past 2 weeks. This could be better evaluated with follow-up nonemergent noncontrast chest CT if clinically appropriate. At the very least, follow-up standing PA and lateral chest radiographs are recommended in the next 2-3 weeks to ensure the stability or resolution of this finding. 2. Aortic atherosclerosis. 3. Status post TAVR. Electronically Signed   By: Vinnie Langton M.D.   On: 09/30/2019 12:50    EKG: Independently reviewed. NSR  Assessment/Plan Active Problems:   PNA (pneumonia)  Acute hypoxic respite failure secondary to right-sided pneumonia, clinically suspect aspiration pneumonia, as the CAT scan also shows some dilated distal esophagus, and CAT scan done in December showed dilated stomach implying gastroparesis. I asked both patient and his wife about aspiration risk, but denied patient any cough or choke after eating. The patient said he does have some dry/sore throat in the morning, exam showed back of the throat does look inflamed, the CAT scan showed fluid collection in the distal esophagus, clinically suspect he has silent aspiration. Outpatient gastric emptying study, add double dose PPI for now. Consider Reglan as needed but patient does not have any times of feeling fullness or nausea vomit at this point. We'll start Unasyn and per week and de-escalate to Augmentin.  Peripheral edema, his Lasix dosage was cut down recently, echo reviewed showed no significant right-sided CHF. DVT less likely given the patient worried on  anticoagulation. Continue current regimen PT evaluation.  Paroxysmal A. fib status post cardioversion, in sinus rhythm and on Eliquis.  Polyuria and dysuria, no signs of UTI on UA, start Pyridium, clinical also suspect patient has  BPH we'll do PVR Q8H.  Extremity weakness, symmetrical, associated with tingling sensation compatible with worsening of his peripheral neuropathy, his PCP increased his gabapentin dosage. PT evaluation.    DVT prophylaxis: Eliquis Code Status: DNR Family Communication: Patient wife Zora over the phone Disposition Plan: Pending on PT evaluation Consults called: None Admission status: Telemetry admission   Lequita Halt MD [facility] Pager [phone]  If 7PM-7AM, please contact night-coverage [email or web address] Password [facility]  09/30/2019, 6:39 PM

## 2019-09-30 NOTE — ED Provider Notes (Signed)
[facility] EMERGENCY DEPARTMENT Provider Note   CSN: [id number] Arrival date & time: 09/30/19  1024     History Chief Complaint  Patient presents with  . Weakness  . Polyuria    [name] [name] is a 79 y.o. male with past medical history significant for ascending aortic aneurysm, hypertension, hyperlipidemia, moderate aortic stenosis, chronic diastolic heart failure status post TAVR's in August 2020, A. Fib on eliquis, multiple myeloma presenting to the emergency department today via EMS with chief complaint of polyuria and generalized weakness with difficulty ambulating has progressively worsened over the last 1 week.  Also endorsing nonproductive cough x2 days.  Patient is only able to give limited history.  I was able to reach patient's spouse the phone who is contributing historian.  She is is reporting sent is more lethargic than usual. Admitted 09/13/2019-09/21/2019 , afib with RVR during admission and cardioverted. Since coming home his legs are weak but he has been able to walk with minimal assistance with cane. PT coming into home and patient seemed to be doing well. Yesterday not stable on his feet needed maximum assistance. Also constipated but did have had normal bowel movement yesterday. Decreased PO intake. No oxygen at home. Checking temperature and no fevers at home. He has had dysuria and urinary frequency x 2 days.  Patient is currently undergoing chemo for his multiple myeloma.  He was scheduled to have chemo infusion on 09/25/2019 and 10/02/2019 however patient's wife called oncology Dr. Irene Limbo to reschedule his appointments as he is very weak from his recent hospital admission.   Past Medical History:  Diagnosis Date  . Ascending aortic aneurysm ([place])   . Bicuspid aortic valve   . Cancer ([place])   . CHF NYHA class I (no symptoms from ordinary activities), acute, diastolic ([place])   . Fatty liver    mild  . GI bleeding 07/21/2018   post polypectomy  .  Hemorrhoids   . HTN (hypertension)   . Hypercholesteremia   . Hypokalemia   . Internal hemorrhoids   . LBP (low back pain)   . Moderate aortic stenosis   . Osteoarthritis   . Paroxysmal atrial fibrillation ([place])    a. new onset Afib in 07/2008. He underwent ibutilide cardioversion successfully. b. Recurrence 01/2013 s/p TEE/DCCV - was on Xarelto but he stopped it as he was convinced it was causing joint pn. c. Recurrence 01/2016 - spont conv to NSR. Pt took Eliquis x1 mo then declined further anticoag. d. Recurrence 07/2016.  [name] Pneumonia   . Tubular adenoma of colon     Patient Active Problem List   Diagnosis Date Noted  . Ischemic colitis ([place]) 09/18/2019  . Pressure injury of skin 09/16/2019  . Bloody diarrhea 09/14/2019  . Abdominal pain, diffuse 09/14/2019  . Acute metabolic encephalopathy 74/25/9563  . Goals of care, counseling/discussion 09/14/2019  . DNR (do not resuscitate) 09/14/2019  . Peripheral neuropathy 09/13/2019  . Gait disorder 09/13/2019  . Multiple myeloma not having achieved remission ([place]) 05/17/2019  . Counseling regarding advance care planning and goals of care 05/17/2019  . Bone metastases ([place]) 05/17/2019  . Postoperative fever 05/09/2019  . S/P TAVR (transcatheter aortic valve replacement) 05/08/2019  . Multiple myeloma ([place]) 05/08/2019  . Acute on chronic diastolic heart failure ([place]) 05/08/2019  . Severe aortic stenosis   . Polymyalgia rheumatica syndrome ([place]) 01/29/2019  . CAD (coronary artery disease) 08/01/2018  . HTN (hypertension) 02/28/2018  . Hypercholesteremia 02/28/2018  . Mastoiditis 03/21/2017  .  Abnormal TSH 12/23/2016  . Parapneumonic effusion   . Acute kidney injury superimposed on CKD ([place])   . Ascending aortic aneurysm ([place]) 02/15/2013  . Atrial fibrillation with RVR ([place]) 01/23/2013  . Well adult exam 05/31/2012  . PAF (paroxysmal atrial fibrillation) ([place]) 08/05/2008  . HLD (hyperlipidemia) 04/08/2007  . Essential hypertension  04/08/2007    Past Surgical History:  Procedure Laterality Date  . BACK SURGERY  x12 years ago  . CARDIOVERSION N/A 01/26/2013   Procedure: CARDIOVERSION;  Surgeon: Larey Dresser, MD;  Location: [facility] ENDOSCOPY;  Service: Cardiovascular;  Laterality: N/A;  . CARDIOVERSION N/A 10/28/2017   Procedure: CARDIOVERSION;  Surgeon: Larey Dresser, MD;  Location: [facility] ENDOSCOPY;  Service: Cardiovascular;  Laterality: N/A;  . CARDIOVERSION N/A 03/03/2018   Procedure: CARDIOVERSION;  Surgeon: Lelon Perla, MD;  Location: [facility] ENDOSCOPY;  Service: Cardiovascular;  Laterality: N/A;  . CARDIOVERSION N/A 09/19/2019   Procedure: CARDIOVERSION;  Surgeon: Larey Dresser, MD;  Location: [facility] ENDOSCOPY;  Service: Cardiovascular;  Laterality: N/A;  . COLONOSCOPY    . COLONOSCOPY  07/17/2018   at [facility]  . HEMORRHOID SURGERY    . LUMBAR LAMINECTOMY    . POLYPECTOMY    . RIGHT HEART CATH N/A 08/20/2019   Procedure: RIGHT HEART CATH;  Surgeon: Larey Dresser, MD;  Location: [facility] CV LAB;  Service: Cardiovascular;  Laterality: N/A;  . RIGHT/LEFT HEART CATH AND CORONARY ANGIOGRAPHY N/A 03/07/2019   Procedure: RIGHT/LEFT HEART CATH AND CORONARY ANGIOGRAPHY;  Surgeon: Burnell Blanks, MD;  Location: [facility] CV LAB;  Service: Cardiovascular;  Laterality: N/A;  . [name]  04/2019  . TEE WITHOUT CARDIOVERSION N/A 01/26/2013   Procedure: TRANSESOPHAGEAL ECHOCARDIOGRAM (TEE);  Surgeon: Larey Dresser, MD;  Location: [facility];  Service: Cardiovascular;  Laterality: N/A;  . TEE WITHOUT CARDIOVERSION N/A 10/28/2017   Procedure: TRANSESOPHAGEAL ECHOCARDIOGRAM (TEE);  Surgeon: Larey Dresser, MD;  Location: [facility] ENDOSCOPY;  Service: Cardiovascular;  Laterality: N/A;  . TEE WITHOUT CARDIOVERSION N/A 05/08/2019   Procedure: TRANSESOPHAGEAL ECHOCARDIOGRAM (TEE);  Surgeon: Burnell Blanks, MD;  Location: [facility] CV LAB;  Service: Open Heart Surgery;  Laterality: N/A;  . TEE WITHOUT CARDIOVERSION N/A  09/19/2019   Procedure: TRANSESOPHAGEAL ECHOCARDIOGRAM (TEE);  Surgeon: Larey Dresser, MD;  Location: [facility] ENDOSCOPY;  Service: Cardiovascular;  Laterality: N/A;  . TRANSCATHETER AORTIC VALVE REPLACEMENT, TRANSFEMORAL N/A 05/08/2019   Procedure: TRANSCATHETER AORTIC VALVE REPLACEMENT, TRANSFEMORAL;  Surgeon: Burnell Blanks, MD;  Location: [facility] CV LAB;  Service: Open Heart Surgery;  Laterality: N/A;       Family History  Problem Relation Age of Onset  . Colon cancer Mother 20  . Hypertension Other   . Coronary artery disease Neg Hx   . Colon polyps Neg Hx   . Esophageal cancer Neg Hx   . Rectal cancer Neg Hx   . Stomach cancer Neg Hx     Social History   Tobacco Use  . Smoking status: Never Smoker  . Smokeless tobacco: Never Used  Substance Use Topics  . Alcohol use: Yes    Comment: Drinks 1 glass of wine nightly/socially  . Drug use: No    Home Medications Prior to Admission medications   Medication Sig Start Date End Date Taking? Authorizing Provider  acyclovir (ZOVIRAX) 400 MG tablet Take 1 tablet (400 mg total) by mouth 2 (two) times daily. 08/15/19   Brunetta Genera, MD  amiodarone (PACERONE) 200 MG tablet Take 1 tablet (200 mg  total) by mouth 2 (two) times daily for 9 days. 09/21/19 09/30/19  Charlynne Cousins, MD  amiodarone (PACERONE) 200 MG tablet Take 1 tablet (200 mg total) by mouth daily. 10/01/19   Charlynne Cousins, MD  amLODipine (NORVASC) 5 MG tablet Take 1 tablet (5 mg total) by mouth daily. 09/21/19 09/15/20  Charlynne Cousins, MD  b complex vitamins tablet Take 1 tablet by mouth daily. 09/25/19   [name], [name], MD  Carboxymethylcellul-Glycerin (LUBRICATING EYE DROPS OP) Place 1 drop into both eyes daily as needed (dry eyes).    [provider]  Cholecalciferol (VITAMIN D) 50 MCG (2000 UT) tablet Take 2,000 Units by mouth daily.    [provider]  dexamethasone (DECADRON) 4 MG tablet Take 3 tablets (12 mg total) by  mouth once a week. Patient taking differently: Take 12 mg by mouth every Monday.  06/12/19   Brunetta Genera, MD  diclofenac Sodium (VOLTAREN) 1 % GEL Apply 1 application topically 4 (four) times daily as needed (pain).    [provider]  DULoxetine (CYMBALTA) 60 MG capsule Take 1 capsule (60 mg total) by mouth daily. 08/15/19   Brunetta Genera, MD  ELIQUIS 5 MG TABS tablet TAKE 1 TABLET BY MOUTH TWICE A DAY Patient taking differently: Take 5 mg by mouth 2 (two) times daily.  07/09/19   Larey Dresser, MD  ergocalciferol (VITAMIN D2) 1.25 MG (50000 UT) capsule Take 1 capsule (50,000 Units total) by mouth once a week. 07/03/19   Brunetta Genera, MD  famotidine (PEPCID) 40 MG tablet Take 1 tablet (40 mg total) by mouth daily. 01/29/19   [name], [name], MD  fentaNYL (DURAGESIC) 12 MCG/HR Place 1 patch onto the skin every 3 (three) days. 08/15/19   Brunetta Genera, MD  furosemide (LASIX) 20 MG tablet Take 1 tablet (20 mg total) by mouth daily. 09/21/19   Charlynne Cousins, MD  gabapentin (NEURONTIN) 300 MG capsule Take 1-2 capsules (300-600 mg total) by mouth 4 (four) times daily. 09/25/19   [name], [name], MD  lenalidomide (REVLIMID) 15 MG capsule Take 1 capsule (15 mg total) by mouth daily. Take for 14 days on, 7 days off, repeat every 21 days. Patient taking differently: Take 15 mg by mouth See admin instructions. Take 15 mg daily for 14 days on, 7 days off, repeat every 21 days. 07/05/19   Brunetta Genera, MD  loratadine (CLARITIN) 10 MG tablet Take 1 tablet (10 mg total) by mouth daily. 12/11/18   [name], [name], MD  LORazepam (ATIVAN) 0.5 MG tablet Take 1 tablet (0.5 mg total) by mouth every 6 (six) hours as needed (Nausea or vomiting). 05/17/19   Brunetta Genera, MD  metoprolol succinate (TOPROL-XL) 25 MG 24 hr tablet Take 1 tablet (25 mg total) by mouth 2 (two) times daily. 09/21/19   Charlynne Cousins, MD  ondansetron (ZOFRAN) 8 MG tablet  Take 1 tablet (8 mg total) by mouth 2 (two) times daily as needed (Nausea or vomiting). 05/17/19   Brunetta Genera, MD  oxyCODONE (OXY IR/ROXICODONE) 5 MG immediate release tablet Take 1-2 tablets (5-10 mg total) by mouth every 4 (four) hours as needed for moderate pain or severe pain. 08/15/19   Brunetta Genera, MD  polyethylene glycol powder [facility]) 17 GM/SCOOP powder Take 17-34 g by mouth 2 (two) times daily as needed for moderate constipation. 09/25/19   [name], [name], MD  potassium chloride SA (KLOR-CON M20) 20  MEQ tablet Take 2 tablets (40 mEq total) by mouth 2 (two) times daily. 08/15/19   Brunetta Genera, MD  prochlorperazine (COMPAZINE) 10 MG tablet Take 1 tablet (10 mg total) by mouth every 6 (six) hours as needed (Nausea or vomiting). 05/17/19   Brunetta Genera, MD  senna-docusate (SENNA S) 8.6-50 MG tablet Take 2 tablets by mouth at bedtime. Patient taking differently: Take 2 tablets by mouth at bedtime as needed for mild constipation.  06/19/19   Brunetta Genera, MD  spironolactone (ALDACTONE) 25 MG tablet Take 1 tablet (25 mg total) by mouth daily. 09/21/19   Charlynne Cousins, MD    Allergies    Xarelto [rivaroxaban], Corticosteroids, Ramipril, and Benazepril  Review of Systems   Review of Systems  All other systems are reviewed and are negative for acute change except as noted in the HPI.   Physical Exam Updated Vital Signs BP 109/65   Pulse (!) 59   Temp 98.4 F (36.9 C)   Resp 15   SpO2 97%   Physical Exam Vitals and nursing note reviewed.  Constitutional:      General: He is not in acute distress.    Appearance: He is not diaphoretic.     Comments: Chronically ill-appearing  HENT:     Head: Normocephalic and atraumatic.     Right Ear: Tympanic membrane and external ear normal.     Left Ear: Tympanic membrane and external ear normal.     Nose: Nose normal.     Mouth/Throat:     Mouth: Mucous membranes are dry.     Pharynx:  Oropharynx is clear.  Eyes:     General: No scleral icterus.       Right eye: No discharge.        Left eye: No discharge.     Extraocular Movements: Extraocular movements intact.     Conjunctiva/sclera: Conjunctivae normal.     Pupils: Pupils are equal, round, and reactive to light.  Neck:     Vascular: No JVD.  Cardiovascular:     Rate and Rhythm: Normal rate and regular rhythm.     Pulses: Normal pulses.          Radial pulses are 2+ on the right side and 2+ on the left side.     Heart sounds: Normal heart sounds.  Pulmonary:     Comments: Lung sounds diminished throughout.  Patient has normal work of breathing.  Symmetric chest rise. No wheezing, rales, or rhonchi.  SPO2 was 88% on room air, improved to 96% on 2 L. Abdominal:     Comments: Abdomen is soft, non-distended, and non-tender in all quadrants. No rigidity, no guarding. No peritoneal signs.  Musculoskeletal:        General: Normal range of motion.     Cervical back: Normal range of motion.     Right lower leg: No edema.     Left lower leg: No edema.  Skin:    General: Skin is warm and dry.     Capillary Refill: Capillary refill takes less than 2 seconds.  Neurological:     Mental Status: He is oriented to person, place, and time.     GCS: GCS eye subscore is 4. GCS verbal subscore is 5. GCS motor subscore is 6.     Comments: Fluent speech, no facial droop.  Patient follows simple commands.  He is alert and oriented x3. Strong and equal grip strength in bilateral upper and lower extremities.  Psychiatric:        Behavior: Behavior normal.     ED Results / Procedures / Treatments   Labs (all labs ordered are listed, but only abnormal results are displayed) Labs Reviewed  BASIC METABOLIC PANEL - Abnormal; Notable for the following components:      Result Value   Chloride 97 (*)    Glucose, Bld 119 (*)    GFR calc non Af Amer 56 (*)    All other components within normal limits  CBC - Abnormal; Notable for the  following components:   WBC 12.0 (*)    All other components within normal limits  URINALYSIS, ROUTINE W REFLEX MICROSCOPIC - Abnormal; Notable for the following components:   APPearance HAZY (*)    Hgb urine dipstick SMALL (*)    All other components within normal limits  CBG MONITORING, ED - Abnormal; Notable for the following components:   Glucose-Capillary 102 (*)    All other components within normal limits  RESPIRATORY PANEL BY RT PCR (FLU A&B, COVID)  URINE CULTURE    EKG EKG Interpretation  Date/Time:  Sunday September 30 2019 10:29:02 EST Ventricular Rate:  73 PR Interval:  208 QRS Duration: 104 QT Interval:  434 QTC Calculation: 478 R Axis:   -40 Text Interpretation: Normal sinus rhythm Left axis deviation Pulmonary disease pattern Nonspecific ST and T wave abnormality Abnormal ECG When compard to prior, more artifact. No STEMI Confirmed by Antony Blackbird [phone]) on 09/30/2019 11:11:01 AM   Radiology CT Angio Chest PE W/Cm &/Or Wo Cm  Result Date: 09/30/2019 CLINICAL DATA:  Hypoxia, shortness of breath EXAM: CT ANGIOGRAPHY CHEST WITH CONTRAST TECHNIQUE: Multidetector CT imaging of the chest was performed using the standard protocol during bolus administration of intravenous contrast. Multiplanar CT image reconstructions and MIPs were obtained to evaluate the vascular anatomy. CONTRAST:  34m OMNIPAQUE IOHEXOL 350 MG/ML SOLN COMPARISON:  04/06/2019 FINDINGS: Cardiovascular: Aortic Root: --Valve: 3.4 cm --Sinuses: 3.4 cm --Sinotubular Junction: 3.4 cm Limitations by motion: Mild Thoracic Aorta: --Ascending Aorta: 4.5 cm (stable since 03/14/2019) --Aortic Arch: 3.2 cm (proximal) --Descending Aorta: 3.3 cm Other: Heart size upper limits normal. Small pericardial effusion. Satisfactory opacification of pulmonary arteries noted, and there is no evidence of pulmonary emboli. Scattered coronary calcifications. Interval TAVR. Mediastinum/Nodes: No hilar or mediastinal adenopathy. Fluid  distends the esophagus. Lungs/Pleura: No pleural effusion. No pneumothorax. Patchy airspace opacities in the posterior segment right upper lobe, throughout the right lower lobe, and to a minimal degree in the posterior left lung base, new since previous. Upper Abdomen: No acute findings. Musculoskeletal: Anterior vertebral endplate spurring at multiple levels in the mid and lower thoracic spine. T9 and T12 compression deformities. Review of the MIP images confirms the above findings. IMPRESSION: 1. Negative for acute PE or thoracic aortic dissection. 2. New patchy airspace opacities predominantly in the right lower lobe, also seen in the posterior upper lobe and to a minimal degree in the posterior left lung base, likely infectious/inflammatory. 3. Fluid distends the esophagus suggesting reflux disease or dysmotility. 4. Stable 4.5 cm ascending thoracic aortic aneurysm. Recommend semi-annual imaging followup by CTA or MRA and referral to cardiothoracic surgery if not already obtained. This recommendation follows 2010 ACCF/AHA/AATS/ACR/ASA/SCA/SCAI/SIR/STS/SVM Guidelines for the Diagnosis and Management of Patients With Thoracic Aortic Disease. Circulation. 2010; 121:: [id number] Signed   By: DLucrezia EuropeM.D.   On: 09/30/2019 14:46   DG Chest Portable 1 View  Result Date: 09/30/2019 CLINICAL DATA:  79year old male with history  of hypoxia. Polyuria. Generalized weakness. EXAM: PORTABLE CHEST 1 VIEW COMPARISON:  Chest x-ray 09/14/2019. FINDINGS: Elongated nodular density projecting over the right upper lobe, new compared to the prior study. No pleural effusions. No evidence of pulmonary edema. No pneumothorax. Heart size is normal. Upper mediastinal contours are within normal limits. Status post transcatheter aortic valve replacement. IMPRESSION: 1. New elongated nodular density projecting over the region of the right upper lobe, presumably infectious or inflammatory in etiology given the rapid  development over the past 2 weeks. This could be better evaluated with follow-up nonemergent noncontrast chest CT if clinically appropriate. At the very least, follow-up standing PA and lateral chest radiographs are recommended in the next 2-3 weeks to ensure the stability or resolution of this finding. 2. Aortic atherosclerosis. 3. Status post TAVR. Electronically Signed   By: Vinnie Langton M.D.   On: 09/30/2019 12:50    Procedures Procedures (including critical care time)  Medications Ordered in ED Medications  sodium chloride flush (NS) 0.9 % injection 3 mL (has no administration in time range)  sodium chloride 0.9 % bolus 1,000 mL (0 mLs Intravenous Stopped 09/30/19 1433)  iohexol (OMNIPAQUE) 350 MG/ML injection 100 mL (66 mLs Intravenous Contrast Given 09/30/19 1407)    ED Course  I have reviewed the triage vital signs and the nursing notes.  Pertinent labs & imaging results that were available during my care of the patient were reviewed by me and considered in my medical decision making (see chart for details).   Vitals:   09/30/19 1415 09/30/19 1416 09/30/19 1430 09/30/19 1500  BP:  105/60 (!) 101/56 109/65  Pulse: 63 63 (!) 59 (!) 59  Resp: '17 16 10 15  '$ Temp:      SpO2: 100% 100% 99% 97%      MDM Rules/Calculators/A&P                       Patient seen and examined.  He is chronically ill-appearing however does not appear to be septic or in distress.  He is afebrile.  Normotensive, not tachycardic.  He was hypoxic to 88% on room air, this improved to 96% on 2 L.  He does not wear oxygen at home.  He has normal work of breathing.  Lung sounds diminished throughout.  No wheezing or rales heard.  Abdomen is nontender, no peritoneal signs.  No signs of volume overload on exam.  He does appear dehydrated, mucous membranes are dry.  DDx includes Covid, pneumonia, PE, UTI, URI.  UA without signs of infection. Possible urinary retention as in and out catheter performed to obtain  urine sample, bladder was emptied at that time.  Patient was later bladder scanned and showed to have 259 ml without any urine in foley bag, patient wearing condom catheter. CBC with leukocytosis of 12, no anemia.  BMP overall unremarkable, no severe electrolyte derangement, no renal insufficiency, normal anion gap.  CBG 102.  Covid swab is pending.  Chest x-ray viewed by me shows a new nodular density in his right upper load.  Discussed this with radiologist who is concerned with infection versus inflammatory, this peers to be rapid development in the last 2 weeks.  We will proceed with CTA chest to rule out PE and to get better imaging of this nodular density.  Anticipate admission.  Chart review shows patient is DNR and it was confirmed during his recent hospital admission. Covid PCR and influenza tests are negative.  CTA chest negative  for PE.  However does suggest possible aspiration pneumonia as there is opacities in right lower lobe.  His ascending thoracic aortic aneurysm is stable at 4.5 cm. This case was discussed with Dr. Sherry Ruffing who has seen the patient and agrees with plan to admit.  Spoke with Dr. Roosevelt Locks with hospitalist service who agrees to assume care of patient and bring into the hospital for further evaluation and management.     Portions of this note were generated with Lobby[employer]. Dictation errors may occur despite best attempts at proofreading.  Final Clinical Impression(s) / ED Diagnoses Final diagnoses:  Acute respiratory failure with hypoxia [facility])    Rx / DC Orders ED Discharge Orders    None       Flint Melter 09/30/19 1910    Tegeler, Gwenyth Allegra, MD 09/30/19 2156

## 2019-09-30 NOTE — ED Notes (Signed)
ED TO INPATIENT HANDOFF REPORT  ED Nurse Name and Phone #: Lorrin Goodell [phone]  S Name/Age/Gender [name] [name] 79 y.o. male Room/Bed: 046C/046C  Code Status   Code Status: DNR  Home/SNF/Other Home Patient oriented to: self, place, time and situation Is this baseline? Yes   Triage Complete: Triage complete  Chief Complaint PNA (pneumonia) [J18.9]  Triage Note Pt arrives via [facility] from home for c/o polyuria and generalized weakness with difficulty ambulating x2 days. pts wife also reported pt has been more lethargic, a/ox4. EMS VS: 124/66, HR 70, RR 18, 100% on 2L, 92% on RA, 99.6 temp. Hx of Multiple myeloma which he is receiving chemo for, recent hospitalization at beg of the month     Allergies Allergies  Allergen Reactions  . Xarelto [Rivaroxaban] Other (See Comments) and Hypertension    INCREASED BP-HYPERTENSIVE EVENTS  . Ambien [Zolpidem]     Hallucinations   . Corticosteroids Other (See Comments)    Made the patient  "sick," feel "weird," and his "body rejected" them   . Ramipril Other (See Comments)    Could not eat or sleep, lost muscle mass  . Benazepril Cough    Level of Care/Admitting Diagnosis ED Disposition    ED Disposition Condition Comment   Admit  Hospital Area: [facility] [[id number]]  Level of Care: Telemetry Medical [104]  Covid Evaluation: Confirmed COVID Negative  Diagnosis: PNA (pneumonia) [259563]  Admitting Physician: Lequita Halt [[id number]]  Attending Physician: Lequita Halt [[id number]]  Estimated length of stay: past midnight tomorrow  Certification:: I certify this patient will need inpatient services for at least 2 midnights       B Medical/Surgery History Past Medical History:  Diagnosis Date  . Ascending aortic aneurysm ([place])   . Bicuspid aortic valve   . Cancer ([place])   . CHF NYHA class I (no symptoms from ordinary activities), acute, diastolic ([place])   . Fatty liver    mild  . GI bleeding 07/21/2018   post  polypectomy  . Hemorrhoids   . HTN (hypertension)   . Hypercholesteremia   . Hypokalemia   . Internal hemorrhoids   . LBP (low back pain)   . Moderate aortic stenosis   . Osteoarthritis   . Paroxysmal atrial fibrillation ([place])    a. new onset Afib in 07/2008. He underwent ibutilide cardioversion successfully. b. Recurrence 01/2013 s/p TEE/DCCV - was on Xarelto but he stopped it as he was convinced it was causing joint pn. c. Recurrence 01/2016 - spont conv to NSR. Pt took Eliquis x1 mo then declined further anticoag. d. Recurrence 07/2016.  [name] Pneumonia   . Tubular adenoma of colon    Past Surgical History:  Procedure Laterality Date  . BACK SURGERY  x12 years ago  . CARDIOVERSION N/A 01/26/2013   Procedure: CARDIOVERSION;  Surgeon: Larey Dresser, MD;  Location: [facility] ENDOSCOPY;  Service: Cardiovascular;  Laterality: N/A;  . CARDIOVERSION N/A 10/28/2017   Procedure: CARDIOVERSION;  Surgeon: Larey Dresser, MD;  Location: [facility] ENDOSCOPY;  Service: Cardiovascular;  Laterality: N/A;  . CARDIOVERSION N/A 03/03/2018   Procedure: CARDIOVERSION;  Surgeon: Lelon Perla, MD;  Location: [facility] ENDOSCOPY;  Service: Cardiovascular;  Laterality: N/A;  . CARDIOVERSION N/A 09/19/2019   Procedure: CARDIOVERSION;  Surgeon: Larey Dresser, MD;  Location: [facility] ENDOSCOPY;  Service: Cardiovascular;  Laterality: N/A;  . COLONOSCOPY    . COLONOSCOPY  07/17/2018   at [facility]  . HEMORRHOID SURGERY    . LUMBAR LAMINECTOMY    .  POLYPECTOMY    . RIGHT HEART CATH N/A 08/20/2019   Procedure: RIGHT HEART CATH;  Surgeon: Larey Dresser, MD;  Location: [place] CV LAB;  Service: Cardiovascular;  Laterality: N/A;  . RIGHT/LEFT HEART CATH AND CORONARY ANGIOGRAPHY N/A 03/07/2019   Procedure: RIGHT/LEFT HEART CATH AND CORONARY ANGIOGRAPHY;  Surgeon: Burnell Blanks, MD;  Location: [place] CV LAB;  Service: Cardiovascular;  Laterality: N/A;  . [name]  04/2019  . TEE WITHOUT CARDIOVERSION N/A 01/26/2013   Procedure:  TRANSESOPHAGEAL ECHOCARDIOGRAM (TEE);  Surgeon: Larey Dresser, MD;  Location: [place];  Service: Cardiovascular;  Laterality: N/A;  . TEE WITHOUT CARDIOVERSION N/A 10/28/2017   Procedure: TRANSESOPHAGEAL ECHOCARDIOGRAM (TEE);  Surgeon: Larey Dresser, MD;  Location: [facility] ENDOSCOPY;  Service: Cardiovascular;  Laterality: N/A;  . TEE WITHOUT CARDIOVERSION N/A 05/08/2019   Procedure: TRANSESOPHAGEAL ECHOCARDIOGRAM (TEE);  Surgeon: Burnell Blanks, MD;  Location: [place] CV LAB;  Service: Open Heart Surgery;  Laterality: N/A;  . TEE WITHOUT CARDIOVERSION N/A 09/19/2019   Procedure: TRANSESOPHAGEAL ECHOCARDIOGRAM (TEE);  Surgeon: Larey Dresser, MD;  Location: [facility] ENDOSCOPY;  Service: Cardiovascular;  Laterality: N/A;  . TRANSCATHETER AORTIC VALVE REPLACEMENT, TRANSFEMORAL N/A 05/08/2019   Procedure: TRANSCATHETER AORTIC VALVE REPLACEMENT, TRANSFEMORAL;  Surgeon: Burnell Blanks, MD;  Location: [place] CV LAB;  Service: Open Heart Surgery;  Laterality: N/A;     A IV Location/Drains/Wounds Patient Lines/Drains/Airways Status   Active Line/Drains/Airways    Name:   Placement date:   Placement time:   Site:   Days:   Peripheral IV 09/30/19 Left Antecubital   09/30/19    --    Antecubital   less than 1   External Urinary Catheter   09/30/19    1237    --   less than 1   Pressure Injury 09/14/19 Sacrum Anterior Stage 2 -  Partial thickness loss of dermis presenting as a shallow open injury with a red, pink wound bed without slough.   09/14/19    1621     16          Intake/Output Last 24 hours  Intake/Output Summary (Last 24 hours) at 09/30/2019 1852 Last data filed at 09/30/2019 1433 Gross per 24 hour  Intake 1000 ml  Output --  Net 1000 ml    Labs/Imaging Results for orders placed or performed during the hospital encounter of 09/30/19 (from the past 48 hour(s))  Basic metabolic panel     Status: Abnormal   Collection Time: 09/30/19 10:50 AM  Result Value Ref  Range   Sodium 137 135 - 145 mmol/L   Potassium 4.6 3.5 - 5.1 mmol/L   Chloride 97 (L) 98 - 111 mmol/L   CO2 30 22 - 32 mmol/L   Glucose, Bld 119 (H) 70 - 99 mg/dL   BUN 17 8 - 23 mg/dL   Creatinine, Ser 1.22 0.61 - 1.24 mg/dL   Calcium 9.3 8.9 - 10.3 mg/dL   GFR calc non Af Amer 56 (L) >60 mL/min   GFR calc Af Amer >60 >60 mL/min   Anion gap 10 5 - 15    Comment: Performed at [facility], [place] [address] [place] [address]  CBC     Status: Abnormal   Collection Time: 09/30/19 10:50 AM  Result Value Ref Range   WBC 12.0 (H) 4.0 - 10.5 K/uL   RBC 4.30 4.22 - 5.81 MIL/uL   Hemoglobin 13.0 13.0 - 17.0 g/dL   HCT  41.2 39.0 - 52.0 %   MCV 95.8 80.0 - 100.0 fL   MCH 30.2 26.0 - 34.0 pg   MCHC 31.6 30.0 - 36.0 g/dL   RDW 14.0 11.5 - 15.5 %   Platelets 161 150 - 400 K/uL   nRBC 0.0 0.0 - 0.2 %    Comment: Performed at [facility], [place] [address] [place], [place] [address]  CBG monitoring, ED     Status: Abnormal   Collection Time: 09/30/19 11:40 AM  Result Value Ref Range   Glucose-Capillary 102 (H) 70 - 99 mg/dL  Urinalysis, Routine w reflex microscopic     Status: Abnormal   Collection Time: 09/30/19 12:17 PM  Result Value Ref Range   Color, Urine YELLOW YELLOW   APPearance HAZY (A) CLEAR   Specific Gravity, Urine 1.010 1.005 - 1.030   pH 8.0 5.0 - 8.0   Glucose, UA NEGATIVE NEGATIVE mg/dL   Hgb urine dipstick SMALL (A) NEGATIVE   Bilirubin Urine NEGATIVE NEGATIVE   Ketones, ur NEGATIVE NEGATIVE mg/dL   Protein, ur NEGATIVE NEGATIVE mg/dL   Nitrite NEGATIVE NEGATIVE   Leukocytes,Ua NEGATIVE NEGATIVE   RBC / HPF 11-20 0 - 5 RBC/hpf   WBC, UA 0-5 0 - 5 WBC/hpf   Bacteria, UA NONE SEEN NONE SEEN   Mucus PRESENT    Hyaline Casts, UA PRESENT     Comment: Performed at [facility], [address] [place], [place] [address]  Respiratory Panel by RT PCR (Flu A&B, Covid) - Nasopharyngeal Swab     Status: None   Collection Time: 09/30/19  1:33 PM    Specimen: Nasopharyngeal Swab  Result Value Ref Range   SARS Coronavirus 2 by RT PCR NEGATIVE NEGATIVE    Comment: (NOTE) SARS-CoV-2 target nucleic acids are NOT DETECTED. The SARS-CoV-2 RNA is generally detectable in upper respiratoy specimens during the acute phase of infection. The lowest concentration of SARS-CoV-2 viral copies this assay can detect is 131 copies/mL. A negative result does not preclude SARS-Cov-2 infection and should not be used as the sole basis for treatment or other patient management decisions. A negative result may occur with  improper specimen collection/handling, submission of specimen other than nasopharyngeal swab, presence of viral mutation(s) within the areas targeted by this assay, and inadequate number of viral copies (<131 copies/mL). A negative result must be combined with clinical observations, patient history, and epidemiological information. The expected result is Negative. Fact Sheet for Patients:  PinkCheek.be Fact Sheet for Healthcare Providers:  GravelBags.it This test is not yet ap proved or cleared by the [place] FDA and  has been authorized for detection and/or diagnosis of SARS-CoV-2 by FDA under an Emergency Use Authorization (EUA). This EUA will remain  in effect (meaning this test can be used) for the duration of the COVID-19 declaration under Section 564(b)(1) of the Act, 21 U.S.C. section 360bbb-3(b)(1), unless the authorization is terminated or revoked sooner.    Influenza A by PCR NEGATIVE NEGATIVE   Influenza B by PCR NEGATIVE NEGATIVE    Comment: (NOTE) The Xpert Xpress SARS-CoV-2/FLU/RSV assay is intended as an aid in  the diagnosis of influenza from Nasopharyngeal swab specimens and  should not be used as a sole basis for treatment. Nasal washings and  aspirates are unacceptable for Xpert Xpress SARS-CoV-2/FLU/RSV  testing. Fact Sheet for  Patients: PinkCheek.be Fact Sheet for Healthcare Providers: GravelBags.it This test is not yet approved or cleared by the [place] and  has been authorized for detection and/or diagnosis of SARS-CoV-2 by  FDA under an Emergency Use Authorization (EUA). This EUA will remain  in effect (meaning this test can be used) for the duration of the  Covid-19 declaration under Section 564(b)(1) of the Act, 21  U.S.C. section 360bbb-3(b)(1), unless the authorization is  terminated or revoked. Performed at [facility], [place] [address] [place], [place] [address]    CT Angio Chest PE W/Cm &/Or Wo Cm  Result Date: 09/30/2019 CLINICAL DATA:  Hypoxia, shortness of breath EXAM: CT ANGIOGRAPHY CHEST WITH CONTRAST TECHNIQUE: Multidetector CT imaging of the chest was performed using the standard protocol during bolus administration of intravenous contrast. Multiplanar CT image reconstructions and MIPs were obtained to evaluate the vascular anatomy. CONTRAST:  22m OMNIPAQUE IOHEXOL 350 MG/ML SOLN COMPARISON:  04/06/2019 FINDINGS: Cardiovascular: Aortic Root: --Valve: 3.4 cm --Sinuses: 3.4 cm --Sinotubular Junction: 3.4 cm Limitations by motion: Mild Thoracic Aorta: --Ascending Aorta: 4.5 cm (stable since 03/14/2019) --Aortic Arch: 3.2 cm (proximal) --Descending Aorta: 3.3 cm Other: Heart size upper limits normal. Small pericardial effusion. Satisfactory opacification of pulmonary arteries noted, and there is no evidence of pulmonary emboli. Scattered coronary calcifications. Interval TAVR. Mediastinum/Nodes: No hilar or mediastinal adenopathy. Fluid distends the esophagus. Lungs/Pleura: No pleural effusion. No pneumothorax. Patchy airspace opacities in the posterior segment right upper lobe, throughout the right lower lobe, and to a minimal degree in the posterior left lung base, new since previous. Upper Abdomen: No acute findings. Musculoskeletal:  Anterior vertebral endplate spurring at multiple levels in the mid and lower thoracic spine. T9 and T12 compression deformities. Review of the MIP images confirms the above findings. IMPRESSION: 1. Negative for acute PE or thoracic aortic dissection. 2. New patchy airspace opacities predominantly in the right lower lobe, also seen in the posterior upper lobe and to a minimal degree in the posterior left lung base, likely infectious/inflammatory. 3. Fluid distends the esophagus suggesting reflux disease or dysmotility. 4. Stable 4.5 cm ascending thoracic aortic aneurysm. Recommend semi-annual imaging followup by CTA or MRA and referral to cardiothoracic surgery if not already obtained. This recommendation follows 2010 ACCF/AHA/AATS/ACR/ASA/SCA/SCAI/SIR/STS/SVM Guidelines for the Diagnosis and Management of Patients With Thoracic Aortic Disease. Circulation. 2010; 121:: O270-J500Electronically Signed   By: DLucrezia EuropeM.D.   On: 09/30/2019 14:46   DG Chest Portable 1 View  Result Date: 09/30/2019 CLINICAL DATA:  79year old male with history of hypoxia. Polyuria. Generalized weakness. EXAM: PORTABLE CHEST 1 VIEW COMPARISON:  Chest x-ray 09/14/2019. FINDINGS: Elongated nodular density projecting over the right upper lobe, new compared to the prior study. No pleural effusions. No evidence of pulmonary edema. No pneumothorax. Heart size is normal. Upper mediastinal contours are within normal limits. Status post transcatheter aortic valve replacement. IMPRESSION: 1. New elongated nodular density projecting over the region of the right upper lobe, presumably infectious or inflammatory in etiology given the rapid development over the past 2 weeks. This could be better evaluated with follow-up nonemergent noncontrast chest CT if clinically appropriate. At the very least, follow-up standing PA and lateral chest radiographs are recommended in the next 2-3 weeks to ensure the stability or resolution of this finding. 2.  Aortic atherosclerosis. 3. Status post TAVR. Electronically Signed   By: DVinnie LangtonM.D.   On: 09/30/2019 12:50    Pending Labs Unresulted Labs (From admission, onward)    Start     Ordered   10/01/19 0500  CBC  Tomorrow morning,   R  09/30/19 1642   09/30/19 1157  Urine culture  ONCE - STAT,   STAT     09/30/19 1156          Vitals/Pain Today's Vitals   09/30/19 1600 09/30/19 1630 09/30/19 1700 09/30/19 1730  BP: 102/67 109/62 115/71 106/61  Pulse:    (!) 56  Resp: '17 18 11 14  '$ Temp:      SpO2:    98%  PainSc:        Isolation Precautions No active isolations  Medications Medications  sodium chloride flush (NS) 0.9 % injection 3 mL (has no administration in time range)  oxyCODONE (Oxy IR/ROXICODONE) immediate release tablet 5-10 mg (has no administration in time range)  acyclovir (ZOVIRAX) tablet 400 mg (has no administration in time range)  furosemide (LASIX) tablet 20 mg (20 mg Oral Given 09/30/19 1848)  metoprolol succinate (TOPROL-XL) 24 hr tablet 25 mg (has no administration in time range)  spironolactone (ALDACTONE) tablet 25 mg (has no administration in time range)  DULoxetine (CYMBALTA) DR capsule 60 mg (has no administration in time range)  LORazepam (ATIVAN) tablet 0.5 mg (has no administration in time range)  prochlorperazine (COMPAZINE) tablet 10 mg (has no administration in time range)  famotidine (PEPCID) tablet 40 mg (40 mg Oral Given 09/30/19 1851)  ondansetron (ZOFRAN) tablet 8 mg (has no administration in time range)  polyethylene glycol powder (GLYCOLAX/MIRALAX) container 17 g (has no administration in time range)  senna-docusate (Senokot-S) tablet 2 tablet (has no administration in time range)  apixaban (ELIQUIS) tablet 5 mg (has no administration in time range)  gabapentin (NEURONTIN) capsule 200 mg (has no administration in time range)  thiamine (VITAMIN B-1) tablet 50 mg (has no administration in time range)  cholecalciferol (VITAMIN D3)  tablet 2,000 Units (has no administration in time range)  loratadine (CLARITIN) tablet 10 mg (10 mg Oral Given 09/30/19 1847)  hydroxypropyl methylcellulose / hypromellose (ISOPTO TEARS / GONIOVISC) 2.5 % ophthalmic solution 1 drop (has no administration in time range)  dexamethasone (DECADRON) tablet 4 mg (has no administration in time range)  acetaminophen (TYLENOL) tablet 650 mg (has no administration in time range)    Or  acetaminophen (TYLENOL) suppository 650 mg (has no administration in time range)  pantoprazole (PROTONIX) EC tablet 40 mg (40 mg Oral Given 09/30/19 1846)  phenazopyridine (PYRIDIUM) tablet 200 mg (200 mg Oral Given 09/30/19 1847)  Ampicillin-Sulbactam (UNASYN) 3 g in sodium chloride 0.9 % 100 mL IVPB (has no administration in time range)  sodium chloride 0.9 % bolus 1,000 mL (0 mLs Intravenous Stopped 09/30/19 1433)  iohexol (OMNIPAQUE) 350 MG/ML injection 100 mL (66 mLs Intravenous Contrast Given 09/30/19 1407)    Mobility walks with device Moderate fall risk   Focused Assessments    R Recommendations: See Admitting Provider Note  Report given to:   Additional Notes:

## 2019-09-30 NOTE — ED Notes (Signed)
CBG 102 

## 2019-09-30 NOTE — ED Notes (Signed)
Report given to Otila Kluver, RN on 66M

## 2019-09-30 NOTE — ED Notes (Signed)
Bladder scan 296mL.

## 2019-09-30 NOTE — ED Notes (Signed)
Please call wife for updates at [phone]

## 2019-10-01 ENCOUNTER — Inpatient Hospital Stay (HOSPITAL_COMMUNITY): Admit: 2019-10-01 | Payer: Medicare Other

## 2019-10-01 ENCOUNTER — Inpatient Hospital Stay (HOSPITAL_COMMUNITY): Payer: Medicare Other

## 2019-10-01 DIAGNOSIS — J189 Pneumonia, unspecified organism: Principal | ICD-10-CM

## 2019-10-01 DIAGNOSIS — N179 Acute kidney failure, unspecified: Secondary | ICD-10-CM

## 2019-10-01 LAB — CBC
HCT: 35.9 % — ABNORMAL LOW (ref 39.0–52.0)
Hemoglobin: 11.4 g/dL — ABNORMAL LOW (ref 13.0–17.0)
MCH: 30.2 pg (ref 26.0–34.0)
MCHC: 31.8 g/dL (ref 30.0–36.0)
MCV: 95.2 fL (ref 80.0–100.0)
Platelets: 128 10*3/uL — ABNORMAL LOW (ref 150–400)
RBC: 3.77 MIL/uL — ABNORMAL LOW (ref 4.22–5.81)
RDW: 13.8 % (ref 11.5–15.5)
WBC: 7.2 10*3/uL (ref 4.0–10.5)
nRBC: 0 % (ref 0.0–0.2)

## 2019-10-01 MED ORDER — SODIUM CHLORIDE 0.9 % IV SOLN: INTRAVENOUS | Status: AC

## 2019-10-01 MED ORDER — PRO-STAT SUGAR FREE PO LIQD
30.0000 mL | Freq: Two times a day (BID) | ORAL | Status: DC
  Administered 2019-10-01 – 2019-10-02 (×3): 30 mL via ORAL
  Filled 2019-10-01 (×3): qty 30

## 2019-10-01 MED ORDER — ADULT MULTIVITAMIN W/MINERALS CH
1.0000 | ORAL_TABLET | Freq: Every day | ORAL | Status: DC
  Administered 2019-10-01 – 2019-10-02 (×2): 1 via ORAL
  Filled 2019-10-01 (×2): qty 1

## 2019-10-01 NOTE — Progress Notes (Signed)
PT Cancellation Note  Patient Details Name: [name] [name] MRN: [id number] DOB: November 06, 1940   Cancelled Treatment:    Reason Eval/Treat Not Completed: Other (comment).  States he had pain meds and will retry at another time as pt wishes to rest.     Ramond Dial 10/01/2019, 10:33 AM   Mee Hives, PT MS Acute Rehab Dept. Number: [place]

## 2019-10-01 NOTE — Progress Notes (Signed)
Initial Nutrition Assessment  DOCUMENTATION CODES:   Not applicable  INTERVENTION:   Obtain admission weight Liberalize diet to regular    Snacks TID  30 ml Prostat BID  NUTRITION DIAGNOSIS:   Increased nutrient needs related to cancer and cancer related treatments as evidenced by estimated needs.  GOAL:   Patient will meet greater than or equal to 90% of their needs  MONITOR:   PO intake, Supplement acceptance, Weight trends, Labs, I & O's  REASON FOR ASSESSMENT:   Malnutrition Screening Tool    ASSESSMENT:   Patient with PMH significant for CHF, HTN, AAA, HLD, moderate aortic stenosis s/p TAVR, and multiple myeloma (currently on chemotherapy). Presents this admission with PNA and peripheral edema.   Pt out of room for renal ultrasound. History obtained from wife. Pt typically eats three small meals daily that consist of B- bagel, warm milk L- fruit, cheese D- meat, fresh vegetables. Endorses taste changes associated with chemo. Does not like sweet foods. Wife recently tried to provide premier protein but pt could not handle the sweetness. Was provided Ensure Enlive last admission and could not drink it. Will try protein rich snacks.   Wife unsure of pts UBW but endorses a 40 lb weight loss in 3 months. Records indicate pt weighed 182 lb on 08/16/2019. No weight taken this admission. Suspect malnutrition but will need to obtain recent weight to assess for weight loss.    I/O: +10 ml since admit UOP: 1,550 ml x 24 hrs   Medications: Vit D, 20 mg lasix daily, aldactone, thiamine  Labs: CBG 102-119   NUTRITION - FOCUSED PHYSICAL EXAM:  Patient out of room  Diet Order:   Diet Order            Diet Heart Room service appropriate? Yes; Fluid consistency: Thin; Fluid restriction: 2000 mL Fluid  Diet effective now              EDUCATION NEEDS:   Not appropriate for education at this time  Skin:  Skin Assessment: Skin Integrity Issues: Skin Integrity Issues::  Other (Comment) Other: MASD- buttocks/groin  Last BM:  1/16  Height:   Ht Readings from Last 1 Encounters:  09/25/19 '5\' 11"'$  (1.803 m)    Weight:   Wt Readings from Last 1 Encounters:  09/25/19 73.9 kg    Ideal Body Weight:  78.2 kg  BMI:  There is no height or weight on file to calculate BMI.  Estimated Nutritional Needs:   Kcal:  2200-2400 kcal  Protein:  115-130 grams  Fluid:  >/= 2.2 L/day   Mariana Single RD, LDN Clinical Nutrition Pager # - [phone]

## 2019-10-01 NOTE — Progress Notes (Signed)
PROGRESS NOTE    [name] [name]  QDI:[id number] DOB: May 30, 1941 DOA: 09/30/2019 PCP: Cassandria Anger, MD  Outpatient Specialists:   Brief Narrative:  As per H&P done by Dr. Wynetta Fines: Patient is a 79 year old male past medical history significant for A. Fibon eliquis, multiplemyeloma, ascending aortic aneurysm, hypertension, hyperlipidemia, moderate aortic stenosis,chronic diastolic heart failure status post TAVR's in August 2020.  Patient was recently hospitalized for sepsis from colitis, and developed afibwith RVRduring admission and cardioverted. Since discharge, hislegs have been weak buthe has been able towalk with minimal assistance with cane. A day prior to admission, patient was not stable on his feet needed maximum assistance. Decreased PO intake. He has had dysuria and urinary.  10/01/2019: Available records reviewed.  Also discussed with patient's oncologist, Dr. Irene Limbo.  Patient's main complaints today is neuropathic pain in both feet.  Apparently, patient developed neuropathic pain in the feet after starting chemotherapy for multiple myeloma.  Patient denied fever, chills or productive cough.  No constitutional symptoms of pneumonia.  Patient is currently on IV Unasyn.  Will consult speech therapy, proceed with modified barium swallow and consult physical therapy.  Patient may have chronic aspiration syndrome.  Patient will need to follow-up with GI on discharge.  Prior diagnosis of gastroparesis noted.  Further management will depend on hospital course.  Meanwhile, we will continue treatment for neuropathic pain.  Hopefully, patient will be discharged in the next 1 to 2 days.   Assessment & Plan:   Active Problems:   PNA (pneumonia)  Acute hypoxic respiratory failure secondary to right-sided pneumonia: -Right-sided pneumonia and acute hypoxic respiratory failure was documented on presentation. -Patient denies constitutional symptoms of pneumonia. -CTA chest finding may  be indicative of chronic aspiration. -Proceed with speech consultation, modified barium swallow, and outpatient follow-up with GI team.  Prior history of gastroparesis noted. -Continue with IV Unasyn for now, but quickly transition to oral Augmentin if antibiotics is indicated.   (CAT scan also shows some dilated distal esophagus, and CAT scan done in December showed dilated stomach implying gastroparesis).    Severe peripheral neuropathy secondary to cancer chemotherapy: -Patient has multiple myeloma. -Patient was on Revlimid and Velcade.   -Patient is followed by Dr. Posey Pronto on outpatient basis  -Discussed with Dr. Irene Limbo, oncologist, will continue management for peripheral neuropathy as per Dr. Posey Pronto.  -Await physical therapy input.  Paroxysmal A. Fib: - Status post cardioversion, in sinus rhythm and on Eliquis.  Polyuria and dysuria, no signs of UTI on UA, start Pyridium, clinical also suspect patient has BPH we'll do PVR Q8H.  Acute kidney injury: -Urine sodium -Urine protein and creatinine -Renal ultrasound -Cautious hydration -Continue to monitor renal function and electrolytes.   DVT prophylaxis: Eliquis Code Status: DO NOT RESUSCITATE Family Communication:  Disposition Plan: This will depend on hospital course   Consultants:   Discussed with patient's oncologist  Procedures:   None  Antimicrobials:   IV Unasyn   Subjective: Reports neuropathic pain in both feet  Objective: Vitals:   09/30/19 1900 09/30/19 1930 09/30/19 2019 10/01/19 0540  BP: 121/72 121/65 116/69 (!) 107/58  Pulse: (!) 53 (!) 54 (!) 57 (!) 54  Resp: _0 Temp:   98 F (36.7 C) 97.6 F (36.4 C)  TempSrc:   Oral   SpO2: 100% 99% 100% 99%    Intake/Output Summary (Last 24 hours) at 10/01/2019 0908 Last data filed at 10/01/2019 0540 Gross per 24 hour  Intake 1440 ml  Output 1550  ml  Net -110 ml   There were no vitals filed for this visit.  Examination:  General exam:  Appears calm and comfortable  Respiratory system: Clear to auscultation. Respiratory effort normal. Cardiovascular system: S1 & S2 heard. No pedal edema. Gastrointestinal system: Abdomen is nondistended, soft and nontender. No organomegaly or masses felt. Normal bowel sounds heard. Central nervous system: Alert and oriented.  Patient moves all extremities.   Extremities: No leg edema  Data Reviewed: I have personally reviewed following labs and imaging studies  CBC: Recent Labs  Lab 09/30/19 1050 10/01/19 0403  WBC 12.0* 7.2  HGB 13.0 11.4*  HCT 41.2 35.9*  MCV 95.8 95.2  PLT 161 876*   Basic Metabolic Panel: Recent Labs  Lab 09/30/19 1050  NA 137  K 4.6  CL 97*  CO2 30  GLUCOSE 119*  BUN 17  CREATININE 1.22  CALCIUM 9.3   GFR: Estimated Creatinine Clearance: 51.3 mL/min (by C-G formula based on SCr of 1.22 mg/dL). Liver Function Tests: No results for input(s): AST, ALT, ALKPHOS, BILITOT, PROT, ALBUMIN in the last 168 hours. No results for input(s): LIPASE, AMYLASE in the last 168 hours. No results for input(s): AMMONIA in the last 168 hours. Coagulation Profile: No results for input(s): INR, PROTIME in the last 168 hours. Cardiac Enzymes: No results for input(s): CKTOTAL, CKMB, CKMBINDEX, TROPONINI in the last 168 hours. BNP (last 3 results) No results for input(s): PROBNP in the last 8760 hours. HbA1C: No results for input(s): HGBA1C in the last 72 hours. CBG: Recent Labs  Lab 09/30/19 1140  GLUCAP 102*   Lipid Profile: No results for input(s): CHOL, HDL, LDLCALC, TRIG, CHOLHDL, LDLDIRECT in the last 72 hours. Thyroid Function Tests: No results for input(s): TSH, T4TOTAL, FREET4, T3FREE, THYROIDAB in the last 72 hours. Anemia Panel: No results for input(s): VITAMINB12, FOLATE, FERRITIN, TIBC, IRON, RETICCTPCT in the last 72 hours. Urine analysis:    Component Value Date/Time   COLORURINE YELLOW 09/30/2019 1217   APPEARANCEUR HAZY (A) 09/30/2019 1217    LABSPEC 1.010 09/30/2019 1217   PHURINE 8.0 09/30/2019 1217   GLUCOSEU NEGATIVE 09/30/2019 1217   GLUCOSEU NEGATIVE 11/19/2015 1352   HGBUR SMALL (A) 09/30/2019 1217   BILIRUBINUR NEGATIVE 09/30/2019 1217   KETONESUR NEGATIVE 09/30/2019 1217   PROTEINUR NEGATIVE 09/30/2019 1217   UROBILINOGEN 0.2 11/19/2015 1352   NITRITE NEGATIVE 09/30/2019 1217   LEUKOCYTESUR NEGATIVE 09/30/2019 1217   Sepsis Labs: _0 (procalcitonin:4,lacticidven:4)  ) Recent Results (from the past 240 hour(s))  Respiratory Panel by RT PCR (Flu A&B, Covid) - Nasopharyngeal Swab     Status: None   Collection Time: 09/30/19  1:33 PM   Specimen: Nasopharyngeal Swab  Result Value Ref Range Status   SARS Coronavirus 2 by RT PCR NEGATIVE NEGATIVE Final    Comment: (NOTE) SARS-CoV-2 target nucleic acids are NOT DETECTED. The SARS-CoV-2 RNA is generally detectable in upper respiratoy specimens during the acute phase of infection. The lowest concentration of SARS-CoV-2 viral copies this assay can detect is 131 copies/mL. A negative result does not preclude SARS-Cov-2 infection and should not be used as the sole basis for treatment or other patient management decisions. A negative result may occur with  improper specimen collection/handling, submission of specimen other than nasopharyngeal swab, presence of viral mutation(s) within the areas targeted by this assay, and inadequate number of viral copies (<131 copies/mL). A negative result must be combined with clinical observations, patient history, and epidemiological information. The expected result is Negative. Fact Sheet  for Patients:  PinkCheek.be Fact Sheet for Healthcare Providers:  GravelBags.it This test is not yet ap proved or cleared by the [place] FDA and  has been authorized for detection and/or diagnosis of SARS-CoV-2 by FDA under an Emergency Use Authorization (EUA). This EUA will  remain  in effect (meaning this test can be used) for the duration of the COVID-19 declaration under Section 564(b)(1) of the Act, 21 U.S.C. section 360bbb-3(b)(1), unless the authorization is terminated or revoked sooner.    Influenza A by PCR NEGATIVE NEGATIVE Final   Influenza B by PCR NEGATIVE NEGATIVE Final    Comment: (NOTE) The Xpert Xpress SARS-CoV-2/FLU/RSV assay is intended as an aid in  the diagnosis of influenza from Nasopharyngeal swab specimens and  should not be used as a sole basis for treatment. Nasal washings and  aspirates are unacceptable for Xpert Xpress SARS-CoV-2/FLU/RSV  testing. Fact Sheet for Patients: PinkCheek.be Fact Sheet for Healthcare Providers: GravelBags.it This test is not yet approved or cleared by the [place] FDA and  has been authorized for detection and/or diagnosis of SARS-CoV-2 by  FDA under an Emergency Use Authorization (EUA). This EUA will remain  in effect (meaning this test can be used) for the duration of the  Covid-19 declaration under Section 564(b)(1) of the Act, 21  U.S.C. section 360bbb-3(b)(1), unless the authorization is  terminated or revoked. Performed at [facility], [place] [address], [place], [place] [address]          Radiology Studies: CT Angio Chest PE W/Cm &/Or Wo Cm  Result Date: 09/30/2019 CLINICAL DATA:  Hypoxia, shortness of breath EXAM: CT ANGIOGRAPHY CHEST WITH CONTRAST TECHNIQUE: Multidetector CT imaging of the chest was performed using the standard protocol during bolus administration of intravenous contrast. Multiplanar CT image reconstructions and MIPs were obtained to evaluate the vascular anatomy. CONTRAST:  59m OMNIPAQUE IOHEXOL 350 MG/ML SOLN COMPARISON:  04/06/2019 FINDINGS: Cardiovascular: Aortic Root: --Valve: 3.4 cm --Sinuses: 3.4 cm --Sinotubular Junction: 3.4 cm Limitations by motion: Mild Thoracic Aorta: --Ascending Aorta: 4.5 cm  (stable since 03/14/2019) --Aortic Arch: 3.2 cm (proximal) --Descending Aorta: 3.3 cm Other: Heart size upper limits normal. Small pericardial effusion. Satisfactory opacification of pulmonary arteries noted, and there is no evidence of pulmonary emboli. Scattered coronary calcifications. Interval TAVR. Mediastinum/Nodes: No hilar or mediastinal adenopathy. Fluid distends the esophagus. Lungs/Pleura: No pleural effusion. No pneumothorax. Patchy airspace opacities in the posterior segment right upper lobe, throughout the right lower lobe, and to a minimal degree in the posterior left lung base, new since previous. Upper Abdomen: No acute findings. Musculoskeletal: Anterior vertebral endplate spurring at multiple levels in the mid and lower thoracic spine. T9 and T12 compression deformities. Review of the MIP images confirms the above findings. IMPRESSION: 1. Negative for acute PE or thoracic aortic dissection. 2. New patchy airspace opacities predominantly in the right lower lobe, also seen in the posterior upper lobe and to a minimal degree in the posterior left lung base, likely infectious/inflammatory. 3. Fluid distends the esophagus suggesting reflux disease or dysmotility. 4. Stable 4.5 cm ascending thoracic aortic aneurysm. Recommend semi-annual imaging followup by CTA or MRA and referral to cardiothoracic surgery if not already obtained. This recommendation follows 2010 ACCF/AHA/AATS/ACR/ASA/SCA/SCAI/SIR/STS/SVM Guidelines for the Diagnosis and Management of Patients With Thoracic Aortic Disease. Circulation. 2010; 121:: [id number] Signed   By: DLucrezia EuropeM.D.   On: 09/30/2019 14:46   DG Chest Portable 1 View  Result Date: 09/30/2019 CLINICAL DATA:  79year old male with  history of hypoxia. Polyuria. Generalized weakness. EXAM: PORTABLE CHEST 1 VIEW COMPARISON:  Chest x-ray 09/14/2019. FINDINGS: Elongated nodular density projecting over the right upper lobe, new compared to the prior study. No  pleural effusions. No evidence of pulmonary edema. No pneumothorax. Heart size is normal. Upper mediastinal contours are within normal limits. Status post transcatheter aortic valve replacement. IMPRESSION: 1. New elongated nodular density projecting over the region of the right upper lobe, presumably infectious or inflammatory in etiology given the rapid development over the past 2 weeks. This could be better evaluated with follow-up nonemergent noncontrast chest CT if clinically appropriate. At the very least, follow-up standing PA and lateral chest radiographs are recommended in the next 2-3 weeks to ensure the stability or resolution of this finding. 2. Aortic atherosclerosis. 3. Status post TAVR. Electronically Signed   By: Vinnie Langton M.D.   On: 09/30/2019 12:50        Scheduled Meds: . acyclovir  400 mg Oral BID  . apixaban  5 mg Oral BID  . cholecalciferol  2,000 Units Oral Daily  . dexamethasone  4 mg Oral Once  . DULoxetine  60 mg Oral Daily  . famotidine  40 mg Oral Daily  . furosemide  20 mg Oral Daily  . gabapentin  200 mg Oral TID  . loratadine  10 mg Oral Daily  . metoprolol succinate  25 mg Oral BID  . pantoprazole  40 mg Oral Daily  . phenazopyridine  200 mg Oral TID WC  . sodium chloride flush  3 mL Intravenous Once  . spironolactone  25 mg Oral Daily  . thiamine  50 mg Oral Daily   Continuous Infusions: . ampicillin-sulbactam (UNASYN) IV 3 g (10/01/19 0515)     LOS: 1 day    Time spent: 35 minutes.    Dana Allan, MD  [facility] Pager #: [phone] 7PM-7AM contact night coverage as above

## 2019-10-02 ENCOUNTER — Ambulatory Visit: Payer: Medicare Other | Admitting: Hematology

## 2019-10-02 ENCOUNTER — Encounter (HOSPITAL_COMMUNITY): Payer: Medicare Other | Admitting: Cardiology

## 2019-10-02 ENCOUNTER — Inpatient Hospital Stay (HOSPITAL_COMMUNITY): Payer: Medicare Other

## 2019-10-02 ENCOUNTER — Other Ambulatory Visit: Payer: Medicare Other

## 2019-10-02 ENCOUNTER — Telehealth: Payer: Self-pay | Admitting: Internal Medicine

## 2019-10-02 DIAGNOSIS — J9601 Acute respiratory failure with hypoxia: Secondary | ICD-10-CM

## 2019-10-02 DIAGNOSIS — R5381 Other malaise: Secondary | ICD-10-CM

## 2019-10-02 DIAGNOSIS — F418 Other specified anxiety disorders: Secondary | ICD-10-CM

## 2019-10-02 LAB — CBC WITH DIFFERENTIAL/PLATELET
Abs Immature Granulocytes: 0.03 10*3/uL (ref 0.00–0.07)
Basophils Absolute: 0 10*3/uL (ref 0.0–0.1)
Basophils Relative: 1 %
Eosinophils Absolute: 0.4 10*3/uL (ref 0.0–0.5)
Eosinophils Relative: 7 %
HCT: 35.5 % — ABNORMAL LOW (ref 39.0–52.0)
Hemoglobin: 11.2 g/dL — ABNORMAL LOW (ref 13.0–17.0)
Immature Granulocytes: 1 %
Lymphocytes Relative: 15 %
Lymphs Abs: 0.8 10*3/uL (ref 0.7–4.0)
MCH: 30.1 pg (ref 26.0–34.0)
MCHC: 31.5 g/dL (ref 30.0–36.0)
MCV: 95.4 fL (ref 80.0–100.0)
Monocytes Absolute: 0.7 10*3/uL (ref 0.1–1.0)
Monocytes Relative: 13 %
Neutro Abs: 3.3 10*3/uL (ref 1.7–7.7)
Neutrophils Relative %: 63 %
Platelets: 136 10*3/uL — ABNORMAL LOW (ref 150–400)
RBC: 3.72 MIL/uL — ABNORMAL LOW (ref 4.22–5.81)
RDW: 13.5 % (ref 11.5–15.5)
WBC: 5.3 10*3/uL (ref 4.0–10.5)
nRBC: 0 % (ref 0.0–0.2)

## 2019-10-02 LAB — RENAL FUNCTION PANEL
Albumin: 2.9 g/dL — ABNORMAL LOW (ref 3.5–5.0)
Anion gap: 8 (ref 5–15)
BUN: 10 mg/dL (ref 8–23)
CO2: 31 mmol/L (ref 22–32)
Calcium: 8.3 mg/dL — ABNORMAL LOW (ref 8.9–10.3)
Chloride: 101 mmol/L (ref 98–111)
Creatinine, Ser: 1.08 mg/dL (ref 0.61–1.24)
GFR calc Af Amer: >60 mL/min (ref >60)
GFR calc non Af Amer: >60 mL/min (ref >60)
Glucose, Bld: 99 mg/dL (ref 70–99)
Phosphorus: 2.7 mg/dL (ref 2.5–4.6)
Potassium: 3.7 mmol/L (ref 3.5–5.1)
Sodium: 140 mmol/L (ref 135–145)

## 2019-10-02 LAB — MAGNESIUM: Magnesium: 2.5 mg/dL — ABNORMAL HIGH (ref 1.7–2.4)

## 2019-10-02 MED ORDER — GABAPENTIN 300 MG PO CAPS: 300.0000 mg | ORAL_CAPSULE | Freq: Three times a day (TID) | ORAL | Status: DC

## 2019-10-02 MED ORDER — AMIODARONE HCL 200 MG PO TABS: 200.0000 mg | ORAL_TABLET | Freq: Every day | ORAL | 1 refills | Status: DC

## 2019-10-02 MED ORDER — AMOXICILLIN-POT CLAVULANATE 875-125 MG PO TABS: 1.0000 | ORAL_TABLET | Freq: Two times a day (BID) | ORAL | 0 refills | Status: AC

## 2019-10-02 NOTE — Discharge Summary (Signed)
Physician Discharge Summary  Osker Ayoub NUU:[id number] DOB: 11-17-1940 DOA: 09/30/2019  PCP: Cassandria Anger, MD  Admit date: 09/30/2019 Discharge date: 10/02/2019  Time spent: 35 minutes  Recommendations for Outpatient Follow-up:  1. Repeat basic metabolic panel to follow across renal function 2. Repeat chest x-ray in 4-6 weeks to assure complete resolution of infiltrates   Discharge Diagnoses:  Active Problems:   AKI (acute kidney injury) ([place])   PNA (pneumonia)   Physical deconditioning   Depression with anxiety Paroxysmal atrial fibrillation Multiple myeloma with severe peripheral neuropathy secondary to chemotherapy GERD  Discharge Condition: Stable and improved.  Patient discharged home with instruction to follow-up with PCP in 10 days.  Diet recommendation: Heart healthy diet.  Filed Weights   10/02/19 0058  Weight: 73 kg    History of present illness:  As per H&P written by Dr. Roosevelt Locks on 09/30/19 79 y.o. male with medical history significant of A. Fibon eliquis, multiplemyeloma, ascending aortic aneurysm, hypertension, hyperlipidemia, moderate aortic stenosis,chronic diastolic heart failure status post TAVR's in August 2020 presented with polyuria and generalized weakness for 2 days. Patient was recently hospitalized for sepsis from colitis, and developed afibwith RVRduring admission and cardioverted. Since discharge, hislegs are weak buthe has been able towalk with minimal assistance with cane.Yesterday not stable on his feet needed maximum assistance. Also constipatedbut did havehad normal bowel movementyesterday. Decreased PO intake. No oxygen at home. Checking temperature and no fevers at home.He has had dysuria and urinary frequency x 2 days.  ED Course: Patient was found to have hypoxia 88% on room air, chest x-ray showed questionable nodules on the right lung, CT angiogram negative for PE but showing right lower lobe infiltrates.  Hospital Course:   1-acute hypoxic respiratory failure secondary to right-sided pneumonia -Right-sided pneumonia appreciated on CT -Patient currently afebrile, after initiation of IV antibiotics and supportive care no longer requiring supplementation with a stable O2 sats on room air -No nausea, no vomiting -Patient will be treated with Augmentin to complete antibiotic therapy. -Advised to maintain adequate hydration. -Speech therapy has seen patient and no changes on his diet consistency recommended at this time. -General precaution while eating and after eating discussed with patient and wife at bedside.  2-multiple myeloma/severe peripheral neuropathy secondary to cancer chemotherapy -Patient with history of multiple myeloma -Currently on Revlimid and Velcade -Patient actively followed by Dr. Posey Pronto in the outpatient basis -Continue treatment and close follow-up by oncologist.  3-paroxysmal atrial fibrillation -Continue Amiodarone and beta-blocker -Continue Eliquis  4-acute kidney injury -Resolved after holding nephrotoxic agents and providing fluid resuscitation -No hydronephrosis appreciated on renal ultrasound -Electrolytes and renal function back to normal at time of discharge -Patient advised to keep himself well-hydrated. -Patient denies dysuria.  5-essential hypertension -Stable continue current antihypertensive regimen -Advised to follow heart healthy diet.  6-depression/anxiety -No hallucinations -A stable mood. -Continue Cymbalta and as needed Ativan.  7-gastroesophageal reflux disease -Continue Pepcid  8-physical deconditioning -Discharged home with home health services.  Procedures:  See below for x-ray reports  Consultations:  None  Discharge Exam: Vitals:   10/02/19 0504 10/02/19 0922  BP: (!) 152/127 115/63  Pulse: (!) 59 (!) 58  Resp:  18  Temp: 97.6 F (36.4 C) 97.9 F (36.6 C)  SpO2: 92% 91%    General: Afebrile, no chest pain, no nausea, no vomiting.   Feeling weak and deconditioned but with good improvement in his breathing and no requiring oxygen supplementation. Cardiovascular: S1 and S2, no rubs, no gallops, no JVD on  exam. Respiratory: Positive rhonchi right, decreased breath sounds at the bases, no frank crackles, no wheezing, no using accessory muscles. Abdomen: Soft, nontender, distended, positive bowel sounds Extremities: No edema, no cyanosis or clubbing.  Discharge Instructions   Discharge Instructions    Discharge instructions   Complete by: As directed    Maintain adequate hydration Take medications as prescribed Arrange follow-up with PCP in 10 days Maintain adequate nutrition.   Increase activity slowly   Complete by: As directed      Allergies as of 10/02/2019      Reactions   Xarelto [rivaroxaban] Other (See Comments), Hypertension   INCREASED BP-HYPERTENSIVE EVENTS   Ambien [zolpidem]    Hallucinations    Corticosteroids Other (See Comments)   Made the patient  "sick," feel "weird," and his "body rejected" them   Ramipril Other (See Comments)   Could not eat or sleep, lost muscle mass   Benazepril Cough      Medication List    STOP taking these medications   lenalidomide 15 MG capsule Commonly known as: REVLIMID     TAKE these medications   acyclovir 400 MG tablet Commonly known as: ZOVIRAX Take 1 tablet (400 mg total) by mouth 2 (two) times daily.   amiodarone 200 MG tablet Commonly known as: Pacerone Take 1 tablet (200 mg total) by mouth daily. What changed: Another medication with the same name was removed. Continue taking this medication, and follow the directions you see here.   amLODipine 5 MG tablet Commonly known as: NORVASC Take 1 tablet (5 mg total) by mouth daily.   amoxicillin-clavulanate 875-125 MG tablet Commonly known as: Augmentin Take 1 tablet by mouth 2 (two) times daily for 7 days.   b complex vitamins tablet Take 1 tablet by mouth daily.   Calcium-Magnesium 500-250 MG  Tabs Take 1 tablet by mouth daily.   dexamethasone 4 MG tablet Commonly known as: DECADRON Take 3 tablets (12 mg total) by mouth once a week. What changed: when to take this   diclofenac Sodium 1 % Gel Commonly known as: VOLTAREN Apply 1 application topically 4 (four) times daily as needed (pain).   DULoxetine 60 MG capsule Commonly known as: CYMBALTA Take 1 capsule (60 mg total) by mouth daily.   Eliquis 5 MG Tabs tablet Generic drug: apixaban TAKE 1 TABLET BY MOUTH TWICE A DAY What changed: how much to take   ergocalciferol 1.25 MG (50000 UT) capsule Commonly known as: VITAMIN D2 Take 1 capsule (50,000 Units total) by mouth once a week.   famotidine 40 MG tablet Commonly known as: Pepcid Take 1 tablet (40 mg total) by mouth daily.   fentaNYL 12 MCG/HR Commonly known as: [place] 1 patch onto the skin every 3 (three) days.   furosemide 20 MG tablet Commonly known as: LASIX Take 1 tablet (20 mg total) by mouth daily.   gabapentin 300 MG capsule Commonly known as: NEURONTIN Take 1 capsule (300 mg total) by mouth 3 (three) times daily. What changed:   how much to take  when to take this   loratadine 10 MG tablet Commonly known as: CLARITIN Take 1 tablet (10 mg total) by mouth daily.   LORazepam 0.5 MG tablet Commonly known as: Ativan Take 1 tablet (0.5 mg total) by mouth every 6 (six) hours as needed (Nausea or vomiting).   LUBRICATING EYE DROPS OP Place 1 drop into both eyes daily as needed (dry eyes).   metoprolol succinate 25 MG 24 hr tablet Commonly  known as: TOPROL-XL Take 1 tablet (25 mg total) by mouth 2 (two) times daily.   ondansetron 8 MG tablet Commonly known as: Zofran Take 1 tablet (8 mg total) by mouth 2 (two) times daily as needed (Nausea or vomiting).   oxyCODONE 5 MG immediate release tablet Commonly known as: Oxy IR/ROXICODONE Take 1-2 tablets (5-10 mg total) by mouth every 4 (four) hours as needed for moderate pain or severe  pain.   polyethylene glycol powder 17 GM/SCOOP powder Commonly known as: GLYCOLAX/MIRALAX Take 17-34 g by mouth 2 (two) times daily as needed for moderate constipation.   potassium chloride SA 20 MEQ tablet Commonly known as: Klor-Con M20 Take 2 tablets (40 mEq total) by mouth 2 (two) times daily.   prochlorperazine 10 MG tablet Commonly known as: COMPAZINE Take 1 tablet (10 mg total) by mouth every 6 (six) hours as needed (Nausea or vomiting).   senna-docusate 8.6-50 MG tablet Commonly known as: Senna S Take 2 tablets by mouth at bedtime. What changed:   when to take this  reasons to take this   spironolactone 25 MG tablet Commonly known as: ALDACTONE Take 1 tablet (25 mg total) by mouth daily.   Vitamin D 50 MCG (2000 UT) tablet Take 2,000 Units by mouth daily.      Allergies  Allergen Reactions  . Xarelto [Rivaroxaban] Other (See Comments) and Hypertension    INCREASED BP-HYPERTENSIVE EVENTS  . Ambien [Zolpidem]     Hallucinations   . Corticosteroids Other (See Comments)    Made the patient  "sick," feel "weird," and his "body rejected" them   . Ramipril Other (See Comments)    Could not eat or sleep, lost muscle mass  . Benazepril Cough   Follow-up Information    [name], [name], MD. Schedule an appointment as soon as possible for a visit in 10 day(s).   Specialty: Internal Medicine Contact information: [place] [address] [phone]            The results of significant diagnostics from this hospitalization (including imaging, microbiology, ancillary and laboratory) are listed below for reference.    Significant Diagnostic Studies: CT ABDOMEN PELVIS WO CONTRAST  Result Date: 09/14/2019 CLINICAL DATA:  Abdominal pain. Nausea, vomiting, diarrhea and liquid stool. History of multiple myeloma. EXAM: CT ABDOMEN AND PELVIS WITHOUT CONTRAST TECHNIQUE: Multidetector CT imaging of the abdomen and pelvis was performed following the  standard protocol without IV contrast. COMPARISON:  CT angiography 03/14/2019. PET CT 04/06/2019 FINDINGS: Lower chest: Dilated distal esophagus containing enteric contrast. Minimal scarring in the right middle lobe and lingula. Minimal dependent atelectasis in the left lower lobe. No pleural fluid. Hepatobiliary: No focal liver lesion on noncontrast exam. Mild gallbladder distention without calcified gallstone or biliary dilatation. No pericholecystic inflammation. Pancreas: Parenchymal atrophy. No ductal dilatation or inflammation. Spleen: Normal in size without focal abnormality. Adrenals/Urinary Tract: Normal adrenal glands. No hydronephrosis or perinephric edema. Bilateral renal cysts. Ureters are decompressed. Urinary bladder is partially distended. Bladder displaced anteriorly by a stool ball distending the rectum. Stomach/Bowel: Enteric contrast in the distal esophagus which is dilated. Distended stomach with air and oral contrast. There is no gastric wall thickening. No evidence of gastric outlet obstruction or mass effect. No small bowel obstruction or dilatation, administered enteric contrast reaches the cecum. Appendix not well visualized, no evidence of appendicitis. Small volume of formed stool in the ascending colon, liquid stool in the transverse and descending colon. Stool within tortuous sigmoid colon. Sigmoid colonic  wall thickening, series 3, image 62. Large stool ball distends the rectum with rectal distention of 8 cm. Mild perirectal fat stranding. No evidence of pneumatosis or perforation. Vascular/Lymphatic: Aorta bi-iliac atherosclerosis. Decreased size of previous retroperitoneal lymph node anterior to the IVC since prior PET, no longer visualized. No enlarged lymph nodes in the abdomen or pelvis. Reproductive: Prostate is unremarkable. Other: No ascites. No free air. Fat in the left inguinal canal. Musculoskeletal: Multiple bone lesions involving the pelvis, largest lesion with lytic in  the right acetabulum. Overall no significant change in size from prior exam. Additional small bone lesions involving ribs. Stable endplate concavity at B04 and T12. No new osseous lesions. IMPRESSION: 1. Large stool ball distending the rectum with rectal distention and mild perirectal fat stranding. Findings consistent with fecal impaction. There is also wall thickening of the adjacent sigmoid colon with mild pericolonic edema. Possibility of stercoral colitis is raised. 2. Findings suggesting gastroparesis with gastric distension with air and contrast, as well as distended distal esophagus that is contrast filled. No evidence of gastric outlet obstruction or mechanical obstructing lesion. No bowel obstruction, administered enteric contrast reaches the cecum. 3. Decreased size of previous retroperitoneal lymph node since prior PET, no longer visualized. 4. Multiple pelvic bone lesions consistent with history of multiple myeloma, grossly unchanged in size from prior exam. Aortic Atherosclerosis (ICD10-I70.0). Electronically Signed   By: Keith Rake M.D.   On: 09/14/2019 04:05   CT HEAD WO CONTRAST  Result Date: 09/14/2019 CLINICAL DATA:  Presyncope EXAM: CT HEAD WITHOUT CONTRAST TECHNIQUE: Contiguous axial images were obtained from the base of the skull through the vertex without intravenous contrast. COMPARISON:  MRI 02/17/2017 FINDINGS: Brain: No evidence of acute infarction, hemorrhage, hydrocephalus, extra-axial collection or mass lesion/mass effect. Scattered low-density changes within the periventricular and subcortical white matter compatible with chronic microvascular ischemic change. Mild diffuse cerebral volume loss. Vascular: Mild atherosclerotic calcifications involving the large vessels of the skull base. No unexpected hyperdense vessel. Skull: Normal. Negative for fracture or focal lesion. Sinuses/Orbits: No acute finding. Other: None. IMPRESSION: 1.  No acute intracranial findings. 2.  Chronic  microvascular ischemic change and cerebral volume loss. Electronically Signed   By: Davina Poke D.O.   On: 09/14/2019 09:59   CT Angio Chest PE W/Cm &/Or Wo Cm  Result Date: 09/30/2019 CLINICAL DATA:  Hypoxia, shortness of breath EXAM: CT ANGIOGRAPHY CHEST WITH CONTRAST TECHNIQUE: Multidetector CT imaging of the chest was performed using the standard protocol during bolus administration of intravenous contrast. Multiplanar CT image reconstructions and MIPs were obtained to evaluate the vascular anatomy. CONTRAST:  29m OMNIPAQUE IOHEXOL 350 MG/ML SOLN COMPARISON:  04/06/2019 FINDINGS: Cardiovascular: Aortic Root: --Valve: 3.4 cm --Sinuses: 3.4 cm --Sinotubular Junction: 3.4 cm Limitations by motion: Mild Thoracic Aorta: --Ascending Aorta: 4.5 cm (stable since 03/14/2019) --Aortic Arch: 3.2 cm (proximal) --Descending Aorta: 3.3 cm Other: Heart size upper limits normal. Small pericardial effusion. Satisfactory opacification of pulmonary arteries noted, and there is no evidence of pulmonary emboli. Scattered coronary calcifications. Interval TAVR. Mediastinum/Nodes: No hilar or mediastinal adenopathy. Fluid distends the esophagus. Lungs/Pleura: No pleural effusion. No pneumothorax. Patchy airspace opacities in the posterior segment right upper lobe, throughout the right lower lobe, and to a minimal degree in the posterior left lung base, new since previous. Upper Abdomen: No acute findings. Musculoskeletal: Anterior vertebral endplate spurring at multiple levels in the mid and lower thoracic spine. T9 and T12 compression deformities. Review of the MIP images confirms the above findings. IMPRESSION: 1.  Negative for acute PE or thoracic aortic dissection. 2. New patchy airspace opacities predominantly in the right lower lobe, also seen in the posterior upper lobe and to a minimal degree in the posterior left lung base, likely infectious/inflammatory. 3. Fluid distends the esophagus suggesting reflux disease or  dysmotility. 4. Stable 4.5 cm ascending thoracic aortic aneurysm. Recommend semi-annual imaging followup by CTA or MRA and referral to cardiothoracic surgery if not already obtained. This recommendation follows 2010 ACCF/AHA/AATS/ACR/ASA/SCA/SCAI/SIR/STS/SVM Guidelines for the Diagnosis and Management of Patients With Thoracic Aortic Disease. Circulation. 2010; 121: K160-F093 Electronically Signed   By: Lucrezia Europe M.D.   On: 09/30/2019 14:46   US Renal  Result Date: 10/01/2019 CLINICAL DATA:  Acute renal injury EXAM: RENAL / URINARY TRACT ULTRASOUND COMPLETE COMPARISON:  None. FINDINGS: Right Kidney: Renal measurements: 11.3 x 5.2 x 5.2 cm = volume: 159 mL. No hydronephrosis is noted. A 1.2 cm upper pole cyst is seen. Left Kidney: Renal measurements: 11.0 x 5.5 x 5.1 cm = volume: 160 mL. No hydronephrosis is noted. 3.3 cm cyst is noted in the upper pole. Bladder: Well distended. Multiple bladder diverticula are noted. Prostate is mildly prominent. Other: None. IMPRESSION: Bilateral renal cysts.  No obstructive changes are noted. Multiple bladder diverticula. Electronically Signed   By: Inez Catalina M.D.   On: 10/01/2019 11:59   DG Chest Portable 1 View  Result Date: 09/30/2019 CLINICAL DATA:  79 year old male with history of hypoxia. Polyuria. Generalized weakness. EXAM: PORTABLE CHEST 1 VIEW COMPARISON:  Chest x-ray 09/14/2019. FINDINGS: Elongated nodular density projecting over the right upper lobe, new compared to the prior study. No pleural effusions. No evidence of pulmonary edema. No pneumothorax. Heart size is normal. Upper mediastinal contours are within normal limits. Status post transcatheter aortic valve replacement. IMPRESSION: 1. New elongated nodular density projecting over the region of the right upper lobe, presumably infectious or inflammatory in etiology given the rapid development over the past 2 weeks. This could be better evaluated with follow-up nonemergent noncontrast chest CT if  clinically appropriate. At the very least, follow-up standing PA and lateral chest radiographs are recommended in the next 2-3 weeks to ensure the stability or resolution of this finding. 2. Aortic atherosclerosis. 3. Status post TAVR. Electronically Signed   By: Vinnie Langton M.D.   On: 09/30/2019 12:50   DG Chest Port 1 View  Result Date: 09/14/2019 CLINICAL DATA:  Weakness. EXAM: PORTABLE CHEST 1 VIEW COMPARISON:  Radiograph 05/09/2019 FINDINGS: Normal heart size and mediastinal contours. TAVR. No pulmonary edema, focal airspace disease, pleural effusion or pneumothorax. No acute osseous abnormalities are seen. IMPRESSION: No acute chest findings. Electronically Signed   By: Keith Rake M.D.   On: 09/14/2019 02:23   DG Swallowing Func-Speech Pathology  Result Date: 10/02/2019 Objective Swallowing Evaluation: Type of Study: MBS-Modified Barium Swallow Study  Patient Details Name: Sinjin Amero MRN: [id number] Date of Birth: [birth year] Today's Date: 10/02/2019 Time: SLP Start Time (ACUTE ONLY): 1000 -SLP Stop Time (ACUTE ONLY): 1020 SLP Time Calculation (min) (ACUTE ONLY): 20 min Past Medical History: Past Medical History: Diagnosis Date . Ascending aortic aneurysm ([place])  . Bicuspid aortic valve  . Cancer ([place])  . CHF NYHA class I (no symptoms from ordinary activities), acute, diastolic ([place])  . Fatty liver   mild . GI bleeding 07/21/2018  post polypectomy . Hemorrhoids  . HTN (hypertension)  . Hypercholesteremia  . Hypokalemia  . Internal hemorrhoids  . LBP (low back pain)  . Moderate aortic stenosis  .  Osteoarthritis  . Paroxysmal atrial fibrillation ([place])   a. new onset Afib in 07/2008. He underwent ibutilide cardioversion successfully. b. Recurrence 01/2013 s/p TEE/DCCV - was on Xarelto but he stopped it as he was convinced it was causing joint pn. c. Recurrence 01/2016 - spont conv to NSR. Pt took Eliquis x1 mo then declined further anticoag. d. Recurrence 07/2016. [name] Pneumonia  . Tubular adenoma of  colon  Past Surgical History: Past Surgical History: Procedure Laterality Date . BACK SURGERY  x12 years ago . CARDIOVERSION N/A 01/26/2013  Procedure: CARDIOVERSION;  Surgeon: Larey Dresser, MD;  Location: [facility] ENDOSCOPY;  Service: Cardiovascular;  Laterality: N/A; . CARDIOVERSION N/A 10/28/2017  Procedure: CARDIOVERSION;  Surgeon: Larey Dresser, MD;  Location: [facility] ENDOSCOPY;  Service: Cardiovascular;  Laterality: N/A; . CARDIOVERSION N/A 03/03/2018  Procedure: CARDIOVERSION;  Surgeon: Lelon Perla, MD;  Location: [facility] ENDOSCOPY;  Service: Cardiovascular;  Laterality: N/A; . CARDIOVERSION N/A 09/19/2019  Procedure: CARDIOVERSION;  Surgeon: Larey Dresser, MD;  Location: [facility] ENDOSCOPY;  Service: Cardiovascular;  Laterality: N/A; . COLONOSCOPY   . COLONOSCOPY  07/17/2018  at [facility] . HEMORRHOID SURGERY   . LUMBAR LAMINECTOMY   . POLYPECTOMY   . RIGHT HEART CATH N/A 08/20/2019  Procedure: RIGHT HEART CATH;  Surgeon: Larey Dresser, MD;  Location: [place] CV LAB;  Service: Cardiovascular;  Laterality: N/A; . RIGHT/LEFT HEART CATH AND CORONARY ANGIOGRAPHY N/A 03/07/2019  Procedure: RIGHT/LEFT HEART CATH AND CORONARY ANGIOGRAPHY;  Surgeon: Burnell Blanks, MD;  Location: [place] CV LAB;  Service: Cardiovascular;  Laterality: N/A; . [name]  04/2019 . TEE WITHOUT CARDIOVERSION N/A 01/26/2013  Procedure: TRANSESOPHAGEAL ECHOCARDIOGRAM (TEE);  Surgeon: Larey Dresser, MD;  Location: [place];  Service: Cardiovascular;  Laterality: N/A; . TEE WITHOUT CARDIOVERSION N/A 10/28/2017  Procedure: TRANSESOPHAGEAL ECHOCARDIOGRAM (TEE);  Surgeon: Larey Dresser, MD;  Location: [facility] ENDOSCOPY;  Service: Cardiovascular;  Laterality: N/A; . TEE WITHOUT CARDIOVERSION N/A 05/08/2019  Procedure: TRANSESOPHAGEAL ECHOCARDIOGRAM (TEE);  Surgeon: Burnell Blanks, MD;  Location: [place] CV LAB;  Service: Open Heart Surgery;  Laterality: N/A; . TEE WITHOUT CARDIOVERSION N/A 09/19/2019  Procedure: TRANSESOPHAGEAL  ECHOCARDIOGRAM (TEE);  Surgeon: Larey Dresser, MD;  Location[facility] END[facility]COPY;  Service: Cardiovascular;  Laterality: N/A; . TRANSCATHETER AORTIC VALVE REPLACEMENT, TRANSFEMORAL N/A 05/08/2019  Procedure: TRANSCATHETER AORTIC VALVE REPLACEMENT, TRANSFEMORAL;  Surgeon: Burnell Blanks, MD;  Location: [place] CV LAB;  Service: Open Heart Surgery;  Laterality: N/A; HPI:  79 y.o. male with medical history significant of A. Fib on eliquis, multiple myeloma, ascending aortic aneurysm, hypertension, hyperlipidemia, moderate aortic stenosis, chronic diastolic heart failure status post TAVR's in August 2020 presented with polyuria and generalized weakness for 2 days. Subjective: Pt seen in radiology for MBS Assessment / Plan / Recommendation CHL IP CLINICAL IMPRESSIONS 10/02/2019 Clinical Impression Pt presents with mild pharyngeal dysphagia, characterized by swallow reflex trigger at the level of the vallecular sinus across consistencies, with occasional trigger at the pyriform sinus. Trace flash penetration was noted x1 on thin liquids during large and consecutive boluses. This was not replicated despite multiple repetitions.  No other penetration or aspiration was seen on any consistency tested. Trace vallecular residue was noted following the swallow of soft solids, otherwise no post-swallow residue seen. Esophageal sweep revealed a small Zenker's diverticulum in the upper esophagus (confirmed by radiologist). Recommend continuing with current (regular) diet and thin liquids. Safe swallow precautions sent back to pt room with transport. No further ST intervention is recommended at this  time.  SLP Visit Diagnosis Dysphagia, oropharyngeal phase (R13.12)     Impact on safety and function Mild aspiration risk   CHL IP TREATMENT RECOMMENDATION 10/02/2019 Treatment Recommendations No treatment recommended at this time   Prognosis 10/02/2019 Prognosis for Safe Diet Advancement Good     CHL IP DIET RECOMMENDATION  10/02/2019 SLP Diet Recommendations Regular solids;Thin liquid Liquid Administration via Cup;Straw Medication Administration Whole meds with liquid Compensations Slow rate;Small sips/bites Postural Changes Seated upright at 90 degrees   CHL IP OTHER RECOMMENDATIONS 10/02/2019   Oral Care Recommendations Oral care BID     CHL IP FOLLOW UP RECOMMENDATIONS 10/02/2019 Follow up Recommendations None      CHL IP ORAL PHASE 10/02/2019 Oral Phase WFL  CHL IP PHARYNGEAL PHASE 10/02/2019 Pharyngeal Phase Impaired   Pharyngeal- Nectar Cup Delayed swallow initiation-vallecula;Delayed swallow initiation-pyriform sinuses Pharyngeal- Thin Cup Delayed swallow initiation-vallecula;Delayed swallow initiation-pyriform sinuses;Reduced airway/laryngeal closure;Penetration/Aspiration during swallow Pharyngeal Material does not enter airway;Material enters airway, remains ABOVE vocal cords then ejected out Pharyngeal- Puree Delayed swallow initiation-vallecula Pharyngeal- Mechanical Soft Delayed swallow initiation-vallecula;Pharyngeal residue - valleculae Pharyngeal- Pill WFL  CHL IP CERVICAL ESOPHAGEAL PHASE 10/02/2019 Cervical Esophageal Phase Impaired - small Zenker's diverticulum noted. Confirmed by radiologist. Enriqueta Shutter. Quentin Ore, [facility], [place] Speech Language Pathologist Office: [phone] Pager: [phone] Shonna Chock 10/02/2019, 11:41 AM              ECHO TEE  Result Date: 09/24/2019   TRANSESOPHOGEAL ECHO REPORT   Patient Name:   [facility] Date of Exam: 09/19/2019 Medical Rec #:  [id number]     Height:       71.0 in Accession #:    [id number]    Weight:       165.8 lb Date of Birth:  [birth year]     BSA:          1.95 m Patient Age:    79 years      BP:           148/99 mmHg Patient Gender: M             HR:           93 bpm. Exam Location:  Inpatient  Procedure: Transesophageal Echo, Color Doppler and Cardiac Doppler Indications:     I48.91* Unspecified atrial fibrillation  History:         Patient has prior history of  Echocardiogram examinations, most                  recent 05/08/2019. Two 29m Edwards Sapien 3 Ultra TAVRs                  implanted valve-in-valve on 05/08/19.  Sonographer:     ERaquel SarnaSenior RDCS Referring Phys:  [id number] D CLEGG Diagnosing Phys: DLoralie ChampagneMD  PROCEDURE: Patients was monitored while under deep sedation. The transesophogeal probe was passed through the esophogus of the patient. The patient developed no complications during the procedure. IMPRESSIONS  1. Left ventricular ejection fraction, by visual estimation, is 60 to 65%. The left ventricle has normal function. There is moderately increased left ventricular hypertrophy.  2. The left ventricle has no regional wall motion abnormalities.  3. Global right ventricle has normal systolic function.The right ventricular size is normal. No increase in right ventricular wall thickness.  4. Left atrial size was mildly dilated. No LA appendage thrombus.  5. Right atrial size was normal.  6. No PFO/ASD noted by color doppler.  7. The tricuspid  valve is normal in structure.  8. S/p valve-in-valve TAVR with bioprosthetic aortic valve. The valve was well-seated with no significant peri-valvular leakage noted and mean gradient 8 mmHg (may be underestimated).  9. The mitral valve is normal in structure. Mild mitral valve regurgitation. No evidence of mitral stenosis. 10. The pulmonic valve was normal in structure. Pulmonic valve regurgitation is trivial. 11. Ascending aorta measured 4.5 cm. There was mild plaque in the descending thoracic aorta. FINDINGS  Left Ventricle: Left ventricular ejection fraction, by visual estimation, is 60 to 65%. The left ventricle has normal function. The left ventricle has no regional wall motion abnormalities. The left ventricular internal cavity size was the left ventricle is normal in size. There is moderately increased left ventricular hypertrophy. Right Ventricle: The right ventricular size is normal. No increase in right  ventricular wall thickness. Global RV systolic function is has normal systolic function. Left Atrium: Left atrial size was mildly dilated. Right Atrium: Right atrial size was normal in size Pericardium: There is no evidence of pericardial effusion. Mitral Valve: The mitral valve is normal in structure. No evidence of mitral valve stenosis by observation. Mild mitral valve regurgitation. Tricuspid Valve: The tricuspid valve is normal in structure. Tricuspid valve regurgitation is trivial. Aortic Valve: The aortic valve has been repaired/replaced. Aortic valve regurgitation is not visualized. Aortic valve mean gradient measures 8.0 mmHg. Aortic valve peak gradient measures 23.6 mmHg. S/p valve-in-valve TAVR with bioprosthetic aortic valve.  The valve was well-seated with no significant peri-valvular leakage noted and mean gradient 8 mmHg (may be underestimated). Pulmonic Valve: The pulmonic valve was normal in structure. Pulmonic valve regurgitation is trivial. Aorta: The aortic root is normal in size and structure. Ascending aorta measured 4.5 cm. There was mild plaque in the descending thoracic aorta. Shunts: No atrial level shunt detected by color flow Doppler.  AORTIC VALVE               Normals AV Vmax:      243.00 cm/s AV Vmean:     129.000 cm/s 77 cm/s AV VTI:       0.261 m      3.15 cm2 AV Peak Grad: 23.6 mmHg AV Mean Grad: 8.0 mmHg     3 mmHg  [name] MD Electronically signed by [name] MD Signature Date/Time: 09/24/2019/2:22:51 PM    Final    ECHOCARDIOGRAM LIMITED  Result Date: 09/19/2019   ECHOCARDIOGRAM LIMITED REPORT   Patient Name:   [facility] Date of Exam: 09/19/2019 Medical Rec #:  [id number]     Height:       66.0 in Accession #:    [id number]    Weight:       165.8 lb Date of Birth:  March 13, 1941     BSA:          1.85 m Patient Age:    32 years      BP:           135/96 mmHg Patient Gender: M             HR:           104 bpm. Exam Location:  Inpatient  Procedure: 3D Echo, Color  Doppler and Cardiac Doppler Indications:    CHF-Acute Diastolic 428.76 / O11.57  History:        Patient has prior history of Echocardiogram examinations, most                 recent 05/30/2019. Aortic Valve:  A Two 41m Edwards Sapien                 bioprosthetic, stented aortic valve (TAVR) Procedure Date:                 05/08/2019 Chronic kidney disease. Multiple myeloma.  Sonographer:    TDarlina SicilianRDCS Referring Phys: [id number] 1. Left ventricular ejection fraction, by visual estimation, is 60 to 65%. The left ventricle has normal function. There is mildly increased left ventricular wall thickness.  2. Global right ventricle has normal systolic function.The right ventricular size is normal.  3. Small to moderate pericardial effusion, measures up to 1.0cm adjacent to RV free wall  4. The inferior vena cava is normal in size with greater than 50% respiratory variability, suggesting right atrial pressure of 3 mmHg.  5. S/p bioprosthetic AVR. No aortic regurgitation. Bioprosthesis functioning normally. MG 10 mmHg, Vmax 2.3 m/s, DI 0.52  6. Compared to prior TTE on 05/30/19, gradient across bioprosthetic aortic valve is decreased FINDINGS  Left Ventricle: Left ventricular ejection fraction, by visual estimation, is 60 to 65%. The left ventricle has normal function. There is mildly increased left ventricular wall thickness. Left ventricular diastolic parameters are indeterminate. Right Ventricle: The right ventricular size is normal. No increase in right ventricular wall thickness. Global RV systolic function is has normal systolic function. Pericardium: A small pericardial effusion is present is seen. A small pericardial effusion is present. Mitral Valve: The mitral valve is normal in structure. No evidence of mitral valve regurgitation. Tricuspid Valve: The tricuspid valve is normal in structure. Tricuspid valve regurgitation is not demonstrated. Aortic Valve: Aortic valve regurgitation  is not visualized. Aortic valve mean gradient measures 8.5 mmHg. Aortic valve peak gradient measures 17.1 mmHg. Aortic valve area, by VTI measures 1.80 cm. Two 272mEdwards Sapien bioprosthetic, stented aortic valve (TAVR) valve is present in the aortic position. Procedure Date: 05/08/2019. Pulmonic Valve: The pulmonic valve was not well visualized. Pulmonic valve regurgitation is not visualized by color flow Doppler. Pulmonic regurgitation is not visualized by color flow Doppler. Aorta: The aortic root is normal in size and structure. Venous: The inferior vena cava is normal in size with greater than 50% respiratory variability, suggesting right atrial pressure of 3 mmHg.  LEFT VENTRICLE          Normals PLAX 2D LVIDd:         3.90 cm  3.6 cm LVIDs:         2.90 cm  1.7 cm LV PW:         1.10 cm  1.4 cm LV IVS:        1.40 cm  1.3 cm LVOT diam:     2.00 cm  2.0 cm LV SV:         34 ml    79 ml LV SV Index:   17.86    45 ml/m2 LVOT Area:     3.14 cm 3.14 cm2  LEFT ATRIUM         Index LA diam:    3.50 cm 1.90 cm/m  AORTIC VALVE                    Normals AV Area (Vmax):    1.38 cm AV Area (Vmean):   1.65 cm     3.06 cm2 AV Area (VTI):     1.80 cm AV Vmax:  206.75 cm/s AV Vmean:          134.250 cm/s 77 cm/s AV VTI:            0.271 m      3.15 cm2 AV Peak Grad:      17.1 mmHg AV Mean Grad:      8.5 mmHg     3 mmHg LVOT Vmax:         90.80 cm/s LVOT Vmean:        70.700 cm/s  75 cm/s LVOT VTI:          0.156 m      25.3 cm LVOT/AV VTI ratio: 0.57         1  AORTA                 Normals Ao Root diam: 2.50 cm 31 mm  SHUNTS Systemic VTI:  0.16 m Systemic Diam: 2.00 cm  Oswaldo Milian MD Electronically signed by Oswaldo Milian MD Signature Date/Time: 09/19/2019/3:26:18 PMThe mitral valve is normal in structure.    Final     Microbiology: Recent Results (from the past 240 hour(s))  Respiratory Panel by RT PCR (Flu A&B, Covid) - Nasopharyngeal Swab     Status: None   Collection Time:  09/30/19  1:33 PM   Specimen: Nasopharyngeal Swab  Result Value Ref Range Status   SARS Coronavirus 2 by RT PCR NEGATIVE NEGATIVE Final    Comment: (NOTE) SARS-CoV-2 target nucleic acids are NOT DETECTED. The SARS-CoV-2 RNA is generally detectable in upper respiratoy specimens during the acute phase of infection. The lowest concentration of SARS-CoV-2 viral copies this assay can detect is 131 copies/mL. A negative result does not preclude SARS-Cov-2 infection and should not be used as the sole basis for treatment or other patient management decisions. A negative result may occur with  improper specimen collection/handling, submission of specimen other than nasopharyngeal swab, presence of viral mutation(s) within the areas targeted by this assay, and inadequate number of viral copies (<131 copies/mL). A negative result must be combined with clinical observations, patient history, and epidemiological information. The expected result is Negative. Fact Sheet for Patients:  PinkCheek.be Fact Sheet for Healthcare Providers:  GravelBags.it This test is not yet ap proved or cleared by the [place] FDA and  has been authorized for detection and/or diagnosis of SARS-CoV-2 by FDA under an Emergency Use Authorization (EUA). This EUA will remain  in effect (meaning this test can be used) for the duration of the COVID-19 declaration under Section 564(b)(1) of the Act, 21 U.S.C. section 360bbb-3(b)(1), unless the authorization is terminated or revoked sooner.    Influenza A by PCR NEGATIVE NEGATIVE Final   Influenza B by PCR NEGATIVE NEGATIVE Final    Comment: (NOTE) The Xpert Xpress SARS-CoV-2/FLU/RSV assay is intended as an aid in  the diagnosis of influenza from Nasopharyngeal swab specimens and  should not be used as a sole basis for treatment. Nasal washings and  aspirates are unacceptable for Xpert Xpress SARS-CoV-2/FLU/RSV   testing. Fact Sheet for Patients: PinkCheek.be Fact Sheet for Healthcare Providers: GravelBags.it This test is not yet approved or cleared by the [place] FDA and  has been authorized for detection and/or diagnosis of SARS-CoV-2 by  FDA under an Emergency Use Authorization (EUA). This EUA will remain  in effect (meaning this test can be used) for the duration of the  Covid-19 declaration under Section 564(b)(1) of the Act, 21  U.S.C. section 360bbb-3(b)(1), unless the authorization  is  terminated or revoked. Performed at [facility], [place] [address], [place], [place] [address]      Labs: Basic Metabolic Panel: Recent Labs  Lab 09/30/19 1050 10/02/19 0537  NA 137 140  K 4.6 3.7  CL 97* 101  CO2 30 31  GLUCOSE 119* 99  BUN 17 10  CREATININE 1.22 1.08  CALCIUM 9.3 8.3*  MG  --  2.5*  PHOS  --  2.7   Liver Function Tests: Recent Labs  Lab 10/02/19 0537  ALBUMIN 2.9*   CBC: Recent Labs  Lab 09/30/19 1050 10/01/19 0403 10/02/19 0537  WBC 12.0* 7.2 5.3  NEUTROABS  --   --  3.3  HGB 13.0 11.4* 11.2*  HCT 41.2 35.9* 35.5*  MCV 95.8 95.2 95.4  PLT 161 128* 136*    BNP (last 3 results) Recent Labs    05/04/19 1620 06/11/19 1235 07/30/19 1028  BNP 1,279.6* 508.3* 126.5*   CBG: Recent Labs  Lab 09/30/19 1140  GLUCAP 102*    Signed:  Barton Dubois MD.  [facility] 10/02/2019, 4:27 PM

## 2019-10-02 NOTE — Progress Notes (Signed)
Modified Barium Swallow Progress Note  Patient Details  Name: [name] [name] MRN: [id number] Date of Birth: 1940/10/26  Today's Date: 10/02/2019  Modified Barium Swallow completed.  Full report located under Chart Review in the Imaging Section.  Brief recommendations include the following:  Clinical Impression Pt presents with mild pharyngeal dysphagia, characterized by swallow reflex trigger at the level of the vallecular sinus across consistencies, with occasional trigger at the pyriform sinus. Trace flash penetration was noted x1 on thin liquids during large and consecutive boluses. This was not replicated despite multiple repetitions.  No other penetration or aspiration was seen on any consistency tested. Trace vallecular residue was noted following the swallow of soft solids, otherwise no post-swallow residue seen. Esophageal sweep revealed a small Zenker's diverticulum in the upper esophagus (confirmed by radiologist). Recommend continuing with current (regular) diet and thin liquids. Safe swallow precautions sent back to pt room with transport. No further ST intervention is recommended at this time.    Swallow Evaluation Recommendations  SLP Diet Recommendations: Regular solids;Thin liquid   Liquid Administration via: Cup;Straw   Medication Administration: Whole meds with liquid   Supervision: Patient able to self feed   Compensations: Slow rate;Small sips/bites   Postural Changes: Seated upright at 90 degrees   Oral Care Recommendations: Oral care BID     Brentyn Seehafer B. Quentin Ore, [facility], [place] Speech Language Pathologist Office: [phone] Pager: [phone]   Shonna Chock 10/02/2019,11:49 AM

## 2019-10-02 NOTE — Progress Notes (Signed)
[name] [name] to be discharged Home per MD order. Discussed prescriptions and follow up appointments with the patient. Prescriptions explained patient; medication list explained in detail. Patient verbalized understanding.  Skin clean, dry and intact without evidence of skin break down, no evidence of skin tears noted. IV catheter discontinued intact. Site without signs and symptoms of complications. Dressing and pressure applied. Pt denies pain at the site currently. No complaints noted.  Patient free of lines, drains, and wounds.   An After Visit Summary (AVS) was printed and given to the patient. Patient escorted via wheelchair, and discharged home via private auto.  Amaryllis Dyke, RN

## 2019-10-02 NOTE — Discharge Instructions (Signed)

## 2019-10-02 NOTE — Telephone Encounter (Signed)
Pt was being seen for PT but has been in the hospital and discharged home and wants to add OT and Nursing/ please advise

## 2019-10-02 NOTE — Evaluation (Signed)
Physical Therapy Evaluation Patient Details Name: [name] [name] MRN: [id number] DOB: 11-15-1940 Today's Date: 10/02/2019   History of Present Illness  79 y.o. male was admitted with LE weakness with PN causing numbness of feet, now with PNA.  Susp aspiration this admit.  Pt recently was admitted with sepsis from colitis, current pt with chemo for mult myeloma.  PMHx:  a-fib, DCCV, TAVR, MM with mets, HTN, CHF,   Clinical Impression  Pt was seen for gait and discussion about therapy needs.  He is not in possession of regular RW but does need it for home.  Will have to exchange the rollator for a RW, but this is being taken care of by family and case management.  Follow acutely for gait, use of stairs and balance/endurance within reasonable tolerances.  Pt is motivated and requires direction for safe limits.    Follow Up Recommendations Home health PT;Supervision/Assistance - 24 hour    Equipment Recommendations  Rolling walker with 5" wheels    Recommendations for Other Services       Precautions / Restrictions Precautions Precautions: Fall Precaution Comments: watch HR Restrictions Weight Bearing Restrictions: No      Mobility  Bed Mobility Overal bed mobility: Needs Assistance Bed Mobility: Supine to Sit;Sit to Supine Rolling: Modified independent (Device/Increase time)   Supine to sit: Min assist     General bed mobility comments: min assist to support trunk to come up to sit  Transfers Overall transfer level: Needs assistance Equipment used: Rolling walker (2 wheeled);1 person hand held assist Transfers: Sit to/from Stand Sit to Stand: Mod assist         General transfer comment: mod from bedside with pt doing better with higher surface  Ambulation/Gait Ambulation/Gait assistance: Min guard Gait Distance (Feet): 150 Feet Assistive device: Rolling walker (2 wheeled) Gait Pattern/deviations: Step-through pattern;Decreased stride length;Wide base of support;Trunk  flexed Gait velocity: reduced Gait velocity interpretation: <1.31 ft/sec, indicative of household ambulator General Gait Details: tends to get a bit outside walker with turns, but is able to manage with vc's  Stairs            Wheelchair Mobility    Modified Rankin (Stroke Patients Only)       Balance Overall balance assessment: Needs assistance Sitting-balance support: Feet supported Sitting balance-Leahy Scale: Fair     Standing balance support: Bilateral upper extremity supported;During functional activity Standing balance-Leahy Scale: Poor Standing balance comment: requires RW for support and safety                             Pertinent Vitals/Pain Pain Assessment: Faces Faces Pain Scale: Hurts little more Pain Location: feet and R side of chest from PNA Pain Descriptors / Indicators: Grimacing;Guarding Pain Intervention(s): Limited activity within patient's tolerance;Monitored during session;Repositioned    Home Living Family/patient expects to be discharged to:: Private residence Living Arrangements: Spouse/significant other Available Help at Discharge: Family Type of Home: House       Home Layout: Two level Home Equipment: Cane - single point;Walker - 4 wheels Additional Comments: needs RW    Prior Function Level of Independence: Independent with assistive device(s)         Comments: wife has been assisting pt     Hand Dominance   Dominant Hand: Right    Extremity/Trunk Assessment   Upper Extremity Assessment Upper Extremity Assessment: Generalized weakness    Lower Extremity Assessment Lower Extremity Assessment: Generalized weakness RLE Sensation:  history of peripheral neuropathy LLE Sensation: history of peripheral neuropathy    Cervical / Trunk Assessment Cervical / Trunk Assessment: Kyphotic  Communication   Communication: Expressive difficulties(related to accent)  Cognition Arousal/Alertness: Awake/alert Behavior  During Therapy: WFL for tasks assessed/performed Overall Cognitive Status: Within Functional Limits for tasks assessed                                 General Comments: has some occas difficulty with following instructions, but motivated to walk      General Comments General comments (skin integrity, edema, etc.): Pt had a couple moments of following instructions where he became a bit confused about the therapy plan and wanted to try something unsafe.  He is also a bit confused at end of session and talking about his diagnoses, asking about what is worse to pass away from, CA vs heart disease    Exercises     Assessment/Plan    PT Assessment Patient needs continued PT services  PT Problem List Decreased strength;Decreased range of motion;Decreased activity tolerance;Decreased balance;Decreased mobility;Decreased coordination;Decreased cognition;Decreased knowledge of use of DME;Decreased safety awareness;Cardiopulmonary status limiting activity;Pain       PT Treatment Interventions DME instruction;Therapeutic activities;Patient/family education;Gait training;Therapeutic exercise;Stair training;Functional mobility training;Balance training;Neuromuscular re-education    PT Goals (Current goals can be found in the Care Plan section)  Acute Rehab PT Goals Patient Stated Goal: to get back to independent level PT Goal Formulation: With patient/family Time For Goal Achievement: 10/16/19 Potential to Achieve Goals: Fair    Frequency Min 3X/week   Barriers to discharge Inaccessible home environment has stairs to go to second floor    Co-evaluation               AM-PAC PT "6 Clicks" Mobility  Outcome Measure Help needed turning from your back to your side while in a flat bed without using bedrails?: None Help needed moving from lying on your back to sitting on the side of a flat bed without using bedrails?: A Little Help needed moving to and from a bed to a chair  (including a wheelchair)?: A Little Help needed standing up from a chair using your arms (e.g., wheelchair or bedside chair)?: A Little Help needed to walk in hospital room?: A Little Help needed climbing 3-5 steps with a railing? : A Little 6 Click Score: 19    End of Session Equipment Utilized During Treatment: Gait belt Activity Tolerance: Patient tolerated treatment well;Treatment limited secondary to medical complications (Comment) Patient left: in bed;with call bell/phone within reach;with bed alarm set;with family/visitor present Nurse Communication: Mobility status PT Visit Diagnosis: Unsteadiness on feet (R26.81);Muscle weakness (generalized) (M62.81);Ataxic gait (R26.0);Pain Pain - Right/Left: Right(Both) Pain - part of body: Ankle and joints of foot(chest)    Time: [id number] PT Time Calculation (min) (ACUTE ONLY): 35 min   Charges:   PT Evaluation $PT Eval Moderate Complexity: 1 Mod PT Treatments $Gait Training: 8-22 mins       Ramond Dial 10/02/2019, 2:04 PM   Mee Hives, PT MS Acute Rehab Dept. Number: [place] [facility] [place]

## 2019-10-02 NOTE — TOC Initial Note (Signed)
Transition of Care [facility]) - Initial/Assessment Note    Patient Details  Name: [name] [name] MRN: [id number] Date of Birth: Aug 27, 1941  Transition of [facility]) CM/SW Contact:    Bartholomew Crews, RN Phone Number: [phone] 10/02/2019, 5:05 PM  Clinical Narrative:                 Spoke with patient and spouse at the bedside. PTA home with spouse and home health services RN, PT from [facility]. Spouse and patient requesting increased therapy. Discussed with [place] liaison, Hoyle Sauer, and added in OT - orders and Face to Face received from MD. Patient has rollator at home - discussed difference between rollator and RW. Educated about Medicare guidelines for DME every 5 years, so not eligible for RW with Medicare and would have to be out of pocket. Patient and spouse declined RW at this time. [facility] referral placed. Patient to transition home with spouse today.  Spouse to transport home. No further TOC needs identified at this time.   Expected Discharge Plan: [place] Barriers to Discharge: No Barriers Identified   Patient Goals and CMS Choice Patient states their goals for this hospitalization and ongoing recovery are:: return home and stay out of hospital CMS Medicare.gov Compare Post Acute Care list provided to:: Patient Choice offered to / list presented to : Patient, Spouse  Expected Discharge Plan and Services Expected Discharge Plan: [place] In-house Referral: NA Discharge Planning Services: CM Consult Post Acute Care Choice: [place] arrangements for the past 2 months: Single Family Home Expected Discharge Date: 10/02/19               DME Arranged: N/A DME Agency: NA       HH Arranged: RN, PT, OT HH Agency: Other - See comment([facility]) Date HH Agency Contacted: 10/02/19 Time HH Agency Contacted: 1600 Representative spoke with at [place]: [place] Arrangements/Services Living arrangements for the past 2 months: [place] with:: Self, Spouse Patient language and need for interpreter reviewed:: Yes Do you feel safe going back to the place where you live?: Yes      Need for Family Participation in Patient Care: Yes (Comment) Care giver support system in place?: Yes (comment) Current home services: Home PT, Home RN, DME Criminal Activity/Legal Involvement Pertinent to Current Situation/Hospitalization: No - Comment as needed  Activities of Daily Living Home Assistive Devices/Equipment: Bedside commode/3-in-1, Walker (specify type), Cane (specify quad or straight) ADL Screening (condition at time of admission) Patient's cognitive ability adequate to safely complete daily activities?: Yes Is the patient deaf or have difficulty hearing?: No Does the patient have difficulty seeing, even when wearing glasses/contacts?: No Does the patient have difficulty concentrating, remembering, or making decisions?: No Patient able to express need for assistance with ADLs?: Yes Does the patient have difficulty dressing or bathing?: No Independently performs ADLs?: Yes (appropriate for developmental age) Does the patient have difficulty walking or climbing stairs?: Yes Weakness of Legs: Both Weakness of Arms/Hands: None  Permission Sought/Granted Permission sought to share information with : Family Supports Permission granted to share information with : Yes, Verbal Permission Granted        Permission granted to share info w Relationship: spouse     Emotional Assessment Appearance:: Appears stated age Attitude/Demeanor/Rapport: Engaged Affect (typically observed): Accepting Orientation: : Oriented to Self, Oriented to  Time, Oriented to Place, Oriented to Situation Alcohol / Substance Use: Not Applicable Psych  Involvement: No (comment)  Admission diagnosis:  Acute respiratory failure with hypoxia (HCC) [J96.01] PNA (pneumonia) [J18.9] Patient Active Problem List   Diagnosis Date Noted  .  Physical deconditioning   . Depression with anxiety   . Acute respiratory failure with hypoxia ([place])   . Ischemic colitis ([place]) 09/18/2019  . Pressure injury of skin 09/16/2019  . Bloody diarrhea 09/14/2019  . Abdominal pain, diffuse 09/14/2019  . Acute metabolic encephalopathy 62/11/5595  . Goals of care, counseling/discussion 09/14/2019  . DNR (do not resuscitate) 09/14/2019  . Peripheral neuropathy 09/13/2019  . Gait disorder 09/13/2019  . Multiple myeloma not having achieved remission ([place]) 05/17/2019  . Counseling regarding advance care planning and goals of care 05/17/2019  . Bone metastases ([place]) 05/17/2019  . Postoperative fever 05/09/2019  . S/P TAVR (transcatheter aortic valve replacement) 05/08/2019  . Multiple myeloma ([place]) 05/08/2019  . Acute on chronic diastolic heart failure ([place]) 05/08/2019  . Severe aortic stenosis   . Polymyalgia rheumatica syndrome ([place]) 01/29/2019  . CAD (coronary artery disease) 08/01/2018  . HTN (hypertension) 02/28/2018  . Hypercholesteremia 02/28/2018  . Mastoiditis 03/21/2017  . Abnormal TSH 12/23/2016  . PNA (pneumonia)   . AKI (acute kidney injury) ([place])   . Ascending aortic aneurysm ([place]) 02/15/2013  . Atrial fibrillation with RVR ([place]) 01/23/2013  . Well adult exam 05/31/2012  . PAF (paroxysmal atrial fibrillation) ([place]) 08/05/2008  . HLD (hyperlipidemia) 04/08/2007  . Essential hypertension 04/08/2007   PCP:  Cassandria Anger, MD Pharmacy:   [facility] [place], [place] - [address] [place] [address] Phone: [phone] Fax: [phone]  [id number] [place] [place] [address]Phone: [phone]: [phone] [facility] Pontes[place] [place]- [address]Phone: [phone]:  [phone] [facility] [place] - [address]Phone: [phone]: [phone]    Social Determinants of Health ([place] Interventions    Readmission Risk Interventions Readmission Risk Prevention Plan 09/20/2019  Transportation Screening Complete  Medication Review (ROlympia Fields Complete  PCP or Specialist appointment within 3-5 days of discharge Complete  HRI or [place]  SW Recovery Care/Counseling Consult Complete  PCenter PointNot Applicable  Some recent data might be hidden

## 2019-10-03 ENCOUNTER — Other Ambulatory Visit: Payer: Self-pay | Admitting: *Deleted

## 2019-10-03 ENCOUNTER — Other Ambulatory Visit: Payer: Self-pay

## 2019-10-03 ENCOUNTER — Telehealth: Payer: Self-pay | Admitting: *Deleted

## 2019-10-03 DIAGNOSIS — C9000 Multiple myeloma not having achieved remission: Secondary | ICD-10-CM

## 2019-10-03 DIAGNOSIS — J189 Pneumonia, unspecified organism: Secondary | ICD-10-CM

## 2019-10-03 MED ORDER — OXYCODONE HCL 5 MG PO TABS: 5.0000 mg | ORAL_TABLET | ORAL | 0 refills | Status: DC | PRN

## 2019-10-03 NOTE — Consult Note (Signed)
Referral from inpatient Transition Of Care [TOC] [facility] team:  Call place to patient's home number [phone] and spoke with wife with the permission of the patient, HIPAA verified, who states she spoke with inpatient TOC RNCM, Wendi.  Wife states she is still awaiting home health to call. Primary Care Provider:  Lew Dawes, MD is his provider and this office does the transition of care follow up at [facility].  Verbal permission given for [facility] Care Management.   Patient evaluated for community based chronic disease management services with [facility] as a benefit of patient's [employer] Accountable Care Organization [ACO].     Patient will receive post hospital discharge call and for assessments fo community needs for care/disease management.    Of note, [facility] Care Management services does not replace or interfere with any services that are arranged by inpatient Transition of Care [TOC] team     For additional questions or referrals please contact:    Natividad Brood, RN BSN [facility] Liaison  [phone] business mobile phone Toll free office [phone]  Fax number: [phone] [email or web address]  [email or web address]

## 2019-10-03 NOTE — Telephone Encounter (Signed)
Verbals given, FYI 

## 2019-10-03 NOTE — Telephone Encounter (Signed)
Transition Care Management Follow-up Telephone Call   Date discharged? 10/02/19  How have you been since you were released from the hospital? Called pt spoke w/wife she states husband is doing pretty good.   Do you understand why you were in the hospital? YES   Do you understand the discharge instructions? YES   Where were you discharged to? Home   Items Reviewed:  Medications reviewed: YES, no longer taking the Revlimid, which has been updated on med list  Allergies reviewed: YES, no changes  Dietary changes reviewed: YES, heart healthy. Wife states he eats doesn't have a problem there  Referrals reviewed: No referral recommeded   Functional Questionnaire:   Activities of Daily Living (ADLs):   She states he are independent in the following: ambulation, bathing and hygiene, feeding, continence, grooming, toileting and dressing States he doesn't require assistance    Any transportation issues/concerns?: NO   Any patient concerns? NO   Confirmed importance and date/time of follow-up visits scheduled YES, appt 10/16/19  Provider Appointment booked with Dr. Alain Marion  Confirmed with patient if condition begins to worsen call PCP or go to the ER.  Patient was given the office number and encouraged to call back with question or concerns.  : YES

## 2019-10-04 ENCOUNTER — Other Ambulatory Visit: Payer: Self-pay | Admitting: *Deleted

## 2019-10-04 ENCOUNTER — Other Ambulatory Visit: Payer: Self-pay | Admitting: Hematology

## 2019-10-04 ENCOUNTER — Other Ambulatory Visit: Payer: Self-pay | Admitting: Internal Medicine

## 2019-10-04 DIAGNOSIS — I130 Hypertensive heart and chronic kidney disease with heart failure and stage 1 through stage 4 chronic kidney disease, or unspecified chronic kidney disease: Secondary | ICD-10-CM | POA: Diagnosis not present

## 2019-10-04 DIAGNOSIS — T451X5D Adverse effect of antineoplastic and immunosuppressive drugs, subsequent encounter: Secondary | ICD-10-CM | POA: Diagnosis not present

## 2019-10-04 DIAGNOSIS — K559 Vascular disorder of intestine, unspecified: Secondary | ICD-10-CM | POA: Diagnosis not present

## 2019-10-04 DIAGNOSIS — C7951 Secondary malignant neoplasm of bone: Secondary | ICD-10-CM | POA: Diagnosis not present

## 2019-10-04 DIAGNOSIS — G620 Drug-induced polyneuropathy: Secondary | ICD-10-CM | POA: Diagnosis not present

## 2019-10-04 DIAGNOSIS — C9000 Multiple myeloma not having achieved remission: Secondary | ICD-10-CM | POA: Diagnosis not present

## 2019-10-04 NOTE — Patient Outreach (Signed)
[facility]) Care Management  10/04/2019  Suede Gledhill 13-Aug-1941 [id number]  Intial outreach for [place] Management. Spoke with Zora, pt's wife, pt was with home health nurse.  Mrs. Maljic gave permission for HIPPA and consent for servies.   Pt just came home from the hospital with dxs: pneumonia, AKI, deconditioning and anxiety. He has a hx of plasma multiple myleloma with bone mets and had been getting treatment until the last month when he has been in and out of the hospital. Oncologist is Dr. Irene Limbo. Other pertinent dxs; AFIB, HTN, CAD, HF, recent TAVR  Provided information on [facility] services and Mrs. Maljic accepted.  She requests that I see if I can get Dr. Alain Marion to order Pro-Stat as pt tolerated this very well and seems to boost him.Called MD office and left a message requesting same/  We discussed prior discussion with Dr. Hilma Favors about palliative care. Zora would like to hold off on that right now but is in agreement is could be a very valuable resource in the future.  Encouraged her to call if any needs arise. I will call again next week.  Eulah Pont. Myrtie Neither, MSN, [facility] Gerontological Nurse Practitioner [facility] Care Management [phone]

## 2019-10-04 NOTE — Telephone Encounter (Signed)
Please advise 

## 2019-10-04 NOTE — Telephone Encounter (Signed)
States that spouse is requesting nutritional supplement (Pro-stat).   Would like sent to pharmacy on file.   Would like a call back to notify once this has been sent in.

## 2019-10-05 ENCOUNTER — Telehealth: Payer: Self-pay | Admitting: Internal Medicine

## 2019-10-05 DIAGNOSIS — T451X5D Adverse effect of antineoplastic and immunosuppressive drugs, subsequent encounter: Secondary | ICD-10-CM | POA: Diagnosis not present

## 2019-10-05 DIAGNOSIS — K559 Vascular disorder of intestine, unspecified: Secondary | ICD-10-CM | POA: Diagnosis not present

## 2019-10-05 DIAGNOSIS — C9000 Multiple myeloma not having achieved remission: Secondary | ICD-10-CM | POA: Diagnosis not present

## 2019-10-05 DIAGNOSIS — G620 Drug-induced polyneuropathy: Secondary | ICD-10-CM | POA: Diagnosis not present

## 2019-10-05 DIAGNOSIS — C7951 Secondary malignant neoplasm of bone: Secondary | ICD-10-CM | POA: Diagnosis not present

## 2019-10-05 DIAGNOSIS — I130 Hypertensive heart and chronic kidney disease with heart failure and stage 1 through stage 4 chronic kidney disease, or unspecified chronic kidney disease: Secondary | ICD-10-CM | POA: Diagnosis not present

## 2019-10-05 NOTE — Telephone Encounter (Signed)
Okay. Thank you.

## 2019-10-05 NOTE — Telephone Encounter (Signed)
Verbal Orders for PT  1x1 2x2 1x3  Please call [phone] [place]

## 2019-10-08 ENCOUNTER — Ambulatory Visit: Payer: Medicare Other

## 2019-10-08 ENCOUNTER — Other Ambulatory Visit: Payer: Medicare Other

## 2019-10-08 ENCOUNTER — Telehealth: Payer: Self-pay

## 2019-10-08 ENCOUNTER — Encounter: Payer: Self-pay | Admitting: *Deleted

## 2019-10-08 DIAGNOSIS — C7951 Secondary malignant neoplasm of bone: Secondary | ICD-10-CM | POA: Diagnosis not present

## 2019-10-08 DIAGNOSIS — K559 Vascular disorder of intestine, unspecified: Secondary | ICD-10-CM | POA: Diagnosis not present

## 2019-10-08 DIAGNOSIS — I130 Hypertensive heart and chronic kidney disease with heart failure and stage 1 through stage 4 chronic kidney disease, or unspecified chronic kidney disease: Secondary | ICD-10-CM | POA: Diagnosis not present

## 2019-10-08 DIAGNOSIS — T451X5D Adverse effect of antineoplastic and immunosuppressive drugs, subsequent encounter: Secondary | ICD-10-CM | POA: Diagnosis not present

## 2019-10-08 DIAGNOSIS — C9000 Multiple myeloma not having achieved remission: Secondary | ICD-10-CM | POA: Diagnosis not present

## 2019-10-08 DIAGNOSIS — G620 Drug-induced polyneuropathy: Secondary | ICD-10-CM | POA: Diagnosis not present

## 2019-10-08 MED ORDER — PRO-STAT 101 PO LIQD: 30.0000 mL | Freq: Every day | ORAL | 5 refills | Status: DC

## 2019-10-08 NOTE — Telephone Encounter (Signed)
LM giving verbals  FYI 

## 2019-10-08 NOTE — Telephone Encounter (Signed)
New message   Home health OT evaluation complete today.   Need verbal order 1 time x week for 6 weeks.   Ok to leave a message on voice mail.

## 2019-10-08 NOTE — Telephone Encounter (Signed)
Verbals given, FYI 

## 2019-10-08 NOTE — Telephone Encounter (Signed)
Notified [facility]

## 2019-10-10 DIAGNOSIS — C7951 Secondary malignant neoplasm of bone: Secondary | ICD-10-CM | POA: Diagnosis not present

## 2019-10-10 DIAGNOSIS — I130 Hypertensive heart and chronic kidney disease with heart failure and stage 1 through stage 4 chronic kidney disease, or unspecified chronic kidney disease: Secondary | ICD-10-CM | POA: Diagnosis not present

## 2019-10-10 DIAGNOSIS — C9000 Multiple myeloma not having achieved remission: Secondary | ICD-10-CM | POA: Diagnosis not present

## 2019-10-10 DIAGNOSIS — T451X5D Adverse effect of antineoplastic and immunosuppressive drugs, subsequent encounter: Secondary | ICD-10-CM | POA: Diagnosis not present

## 2019-10-10 DIAGNOSIS — G620 Drug-induced polyneuropathy: Secondary | ICD-10-CM | POA: Diagnosis not present

## 2019-10-10 DIAGNOSIS — K559 Vascular disorder of intestine, unspecified: Secondary | ICD-10-CM | POA: Diagnosis not present

## 2019-10-10 NOTE — Progress Notes (Signed)
HEMATOLOGY/ONCOLOGY CLINIC NOTE  Date of Service: 10/11/2019  Patient Care Team: Cassandria Anger, MD as PCP - General Erline Levine, MD as Attending Physician (Neurosurgery) Larey Dresser, MD (Cardiology) Jarome Matin, MD as Consulting Physician (Dermatology) Rigoberto Noel, MD as Consulting Physician (Pulmonary Disease) Alda Berthold, DO as Consulting Physician (Neurology) Deloria Lair, NP as [facility]   CHIEF COMPLAINTS/PURPOSE OF CONSULTATION:   Plasma cell myeloma   HISTORY OF PRESENTING ILLNESS:  [name] [name] is a wonderful 79 y.o. male who has been referred to [place] by Angelena Form, PA for evaluation and management of lytic bone lesions. The pt reports that he is doing well overall.  The pt reports that he has occasional hip pain that radiates down his legs and prevents him from walking. He uses Advil, which helps his hip pain. He is used to exercising often, but has not been able to stay very physically active lately so he is gaining weight. The pt experiences some SOB when he wakes up in the morning.  The pt had a pre-procedural CTA C/A/P before a TAVR completed on 03/14/2019 which revealed "indeterminate osseus lesions in the bony pelvis," which led to an MRI and PET scan. He reports that he has pain when he pushes on his right chest.   He also notes that he had a fall while exercising in 06/2018 and thought he broke his back. He received a blood transfusion on 07/24/2018.   Of note prior to the patient's visit today, the pt has had a MRI pelvis w/wo contrast completed on 03/28/2019 with results revealing "1. Destructive bone lesions as detailed above. Findings most consistent with metastatic disease. PET-CT may be helpful for further evaluation and to establish a primary tumor. The right pelvic bone lesions should be amenable to image guided biopsy but a PET scan may demonstrate easier/safer biopsy sites. 2. No intrapelvic mass or  adenopathy. 3. Benign intraosseous lipoma involving the left anterior superior acetabulum."  The pt has also had PET whole body completed on 04/06/2019 with results revealing "1. Diffuse osseous metastatic disease as detailed above without findings for a primary neoplasm in the chest, abdomen or pelvis. The large destructive lesion involving the right ischium should be amenable to image guided biopsy. 2. Two small retroperitoneal lymph nodes and 1 small right obturator node showing hypermetabolism."   Most recent lab results (04/06/2019) of CBC is as follows: all values are WNL.  On review of systems, pt reports hip and leg pain, weight gain and denies syncope and denies belly pain, recent neuropathy and any other symptoms.   On PMHx the pt reports 5 cm hepatic flexure polyp removal, pneumonia, blood transfusion on 07/24/2018  On Social Hx the pt reports that he lives at home with his wife. He is from [place].    INTERVAL HISTORY:  [name] [name] is a wonderful 79 y.o. male who has who is here today for evaluation and management of newly diagnosed Plasma Cell Myeloma. The patient's last visit with [place] was on 09/03/2019. The pt reports that he is doing well overall. He is accompanied today by his wife Zora Fallas.  The pt reports his bowels are so so.   He is no longer having blood in his stool. He is on blood thinners.   He is concerned about is weight loss.  He is not eating much at home.  He is taking Cymbalta and fentanyl. He was taking gabapentin one in the morning to  2 at night. Before leaving the hospital they changed the dosage to 3 times a day.   Two oxycodone in the morning and two at night   He has a home physical therapist that comes twice a week.   His back and hip pain are much better than before.   ED admission 09/13/2019-09/21/2019 for bloody diarrhea, abdominal pain, confusion. Possibly  from from ischemas colitis    09/30/2019- ED admission for acute respiratory  failure with hypoxia (pnemonia)  Lab results today (10/11/19) of CBC w/diff and CMP is as follows: all values are WNL except for WBC at 12.3, RBC at 3.98, Hemoglobing at 11.8, MCV at 36.6,Neutro Abs at 10.1, Abs Immature Granulocytes at 0.09, BUN at 30, Creatinine at 1.42, Total protein at 6.1, ALT at 60, GFR Est Non Af Am at 47, GFR Est AFR AM at 54 PENDING Kappa and MMP.  On review of systems, pt reports decrease in appetite, GERD and denies back pain, hip pain, bone pain, abdominal pain and any other symptoms.   MEDICAL HISTORY:  Past Medical History:  Diagnosis Date  . Ascending aortic aneurysm ([place])   . Bicuspid aortic valve   . Cancer ([place])   . CHF NYHA class I (no symptoms from ordinary activities), acute, diastolic ([place])   . Fatty liver    mild  . GI bleeding 07/21/2018   post polypectomy  . Hemorrhoids   . HTN (hypertension)   . Hypercholesteremia   . Hypokalemia   . Internal hemorrhoids   . LBP (low back pain)   . Moderate aortic stenosis   . Osteoarthritis   . Paroxysmal atrial fibrillation ([place])    a. new onset Afib in 07/2008. He underwent ibutilide cardioversion successfully. b. Recurrence 01/2013 s/p TEE/DCCV - was on Xarelto but he stopped it as he was convinced it was causing joint pn. c. Recurrence 01/2016 - spont conv to NSR. Pt took Eliquis x1 mo then declined further anticoag. d. Recurrence 07/2016.  [name] Pneumonia   . Tubular adenoma of colon     SURGICAL HISTORY: Past Surgical History:  Procedure Laterality Date  . BACK SURGERY  x12 years ago  . CARDIOVERSION N/A 01/26/2013   Procedure: CARDIOVERSION;  Surgeon: Larey Dresser, MD;  Location: [facility] ENDOSCOPY;  Service: Cardiovascular;  Laterality: N/A;  . CARDIOVERSION N/A 10/28/2017   Procedure: CARDIOVERSION;  Surgeon: Larey Dresser, MD;  Location: [facility] ENDOSCOPY;  Service: Cardiovascular;  Laterality: N/A;  . CARDIOVERSION N/A 03/03/2018   Procedure: CARDIOVERSION;  Surgeon: Lelon Perla, MD;  Location: [facility]  ENDOSCOPY;  Service: Cardiovascular;  Laterality: N/A;  . CARDIOVERSION N/A 09/19/2019   Procedure: CARDIOVERSION;  Surgeon: Larey Dresser, MD;  Location: [facility] ENDOSCOPY;  Service: Cardiovascular;  Laterality: N/A;  . COLONOSCOPY    . COLONOSCOPY  07/17/2018   at [facility]  . HEMORRHOID SURGERY    . LUMBAR LAMINECTOMY    . POLYPECTOMY    . RIGHT HEART CATH N/A 08/20/2019   Procedure: RIGHT HEART CATH;  Surgeon: Larey Dresser, MD;  Location: [place] CV LAB;  Service: Cardiovascular;  Laterality: N/A;  . RIGHT/LEFT HEART CATH AND CORONARY ANGIOGRAPHY N/A 03/07/2019   Procedure: RIGHT/LEFT HEART CATH AND CORONARY ANGIOGRAPHY;  Surgeon: Burnell Blanks, MD;  Location: [place] CV LAB;  Service: Cardiovascular;  Laterality: N/A;  . [name]  04/2019  . TEE WITHOUT CARDIOVERSION N/A 01/26/2013   Procedure: TRANSESOPHAGEAL ECHOCARDIOGRAM (TEE);  Surgeon: Larey Dresser, MD;  Location: [place];  Service:  Cardiovascular;  Laterality: N/A;  . TEE WITHOUT CARDIOVERSION N/A 10/28/2017   Procedure: TRANSESOPHAGEAL ECHOCARDIOGRAM (TEE);  Surgeon: Larey Dresser, MD;  Location: [facility] ENDOSCOPY;  Service: Cardiovascular;  Laterality: N/A;  . TEE WITHOUT CARDIOVERSION N/A 05/08/2019   Procedure: TRANSESOPHAGEAL ECHOCARDIOGRAM (TEE);  Surgeon: Burnell Blanks, MD;  Location: [place] CV LAB;  Service: Open Heart Surgery;  Laterality: N/A;  . TEE WITHOUT CARDIOVERSION N/A 09/19/2019   Procedure: TRANSESOPHAGEAL ECHOCARDIOGRAM (TEE);  Surgeon: Larey Dresser, MD;  Location: [facility] ENDOSCOPY;  Service: Cardiovascular;  Laterality: N/A;  . TRANSCATHETER AORTIC VALVE REPLACEMENT, TRANSFEMORAL N/A 05/08/2019   Procedure: TRANSCATHETER AORTIC VALVE REPLACEMENT, TRANSFEMORAL;  Surgeon: Burnell Blanks, MD;  Location: [place] CV LAB;  Service: Open Heart Surgery;  Laterality: N/A;    SOCIAL HISTORY: Social History   Socioeconomic History  . Marital status: Married    Spouse name: Not on  file  . Number of children: 0  . Years of education: Not on file  . Highest education level: Not on file  Occupational History  . Occupation: Retired Lobbyist: [employer]  Tobacco Use  . Smoking status: Never Smoker  . Smokeless tobacco: Never Used  Substance and Sexual Activity  . Alcohol use: Yes    Comment: Drinks 1 glass of wine nightly/socially  . Drug use: No  . Sexual activity: Yes  Other Topics Concern  . Not on file  Social History Narrative   Patient lives in [place] w/ his wife. He is a native of [place]. 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   Right-handed   Caffeine: 2 cups coffee per day   Two story home   Social Determinants of Health   Financial Resource Strain:   . Difficulty of Paying Living Expenses: Not on file  Food Insecurity:   . Worried About Charity fundraiser in the Last Year: Not on file  . Ran Out of Food in the Last Year: Not on file  Transportation Needs:   . Lack of Transportation (Medical): Not on file  . Lack of Transportation (Non-Medical): Not on file  Physical Activity:   . Days of Exercise per Week: Not on file  . Minutes of Exercise per Session: Not on file  Stress:   . Feeling of Stress : Not on file  Social Connections:   . Frequency of Communication with Friends and Family: Not on file  . Frequency of Social Gatherings with Friends and Family: Not on file  . Attends Religious Services: Not on file  . Active Member of Clubs or Organizations: Not on file  . Attends Archivist Meetings: Not on file  . Marital Status: Not on file  Intimate Partner Violence:   . Fear of Current or Ex-Partner: Not on file  . Emotionally Abused: Not on file  . Physically Abused: Not on file  . Sexually Abused: Not on file    FAMILY HISTORY: Family History  Problem Relation Age of Onset  . Colon cancer Mother 74  . Hypertension Other   . Coronary artery disease Neg Hx     . Colon polyps Neg Hx   . Esophageal cancer Neg Hx   . Rectal cancer Neg Hx   . Stomach cancer Neg Hx     ALLERGIES:  is allergic to xarelto [rivaroxaban]; ambien [zolpidem]; corticosteroids; ramipril; and benazepril.  MEDICATIONS:  Current Outpatient Medications  Medication Sig Dispense Refill  .  acyclovir (ZOVIRAX) 400 MG tablet Take 1 tablet (400 mg total) by mouth 2 (two) times daily. 180 tablet 3  . Amino Acids-Protein Hydrolys (PRO-STAT 101) LIQD Take 30 mLs by mouth daily. 887 mL 5  . amiodarone (PACERONE) 200 MG tablet Take 1 tablet (200 mg total) by mouth daily. 30 tablet 1  . amLODipine (NORVASC) 5 MG tablet Take 1 tablet (5 mg total) by mouth daily. 90 tablet 3  . b complex vitamins tablet Take 1 tablet by mouth daily. 100 tablet 3  . Calcium-Magnesium 500-250 MG TABS Take 1 tablet by mouth daily.    . Carboxymethylcellul-Glycerin (LUBRICATING EYE DROPS OP) Place 1 drop into both eyes daily as needed (dry eyes).    . Cholecalciferol (VITAMIN D) 50 MCG (2000 UT) tablet Take 2,000 Units by mouth daily.    [name] dexamethasone (DECADRON) 4 MG tablet Take 3 tablets (12 mg total) by mouth once a week. (Patient taking differently: Take 12 mg by mouth every Monday. ) 20 tablet 6  . diclofenac Sodium (VOLTAREN) 1 % GEL Apply 1 application topically 4 (four) times daily as needed (pain).    . DULoxetine (CYMBALTA) 60 MG capsule Take 1 capsule (60 mg total) by mouth daily. 30 capsule 1  . ELIQUIS 5 MG TABS tablet TAKE 1 TABLET BY MOUTH TWICE A DAY (Patient taking differently: Take 5 mg by mouth 2 (two) times daily. ) 60 tablet 6  . ergocalciferol (VITAMIN D2) 1.25 MG (50000 UT) capsule Take 1 capsule (50,000 Units total) by mouth once a week. 12 capsule 3  . famotidine (PEPCID) 40 MG tablet Take 1 tablet (40 mg total) by mouth daily. 30 tablet 11  . fentaNYL (DURAGESIC) 12 MCG/HR Place 1 patch onto the skin every 3 (three) days. 10 patch 0  . furosemide (LASIX) 20 MG tablet Take 1 tablet (20  mg total) by mouth daily. 30 tablet 3  . gabapentin (NEURONTIN) 300 MG capsule Take 1 capsule (300 mg total) by mouth 3 (three) times daily.    [name] loratadine (CLARITIN) 10 MG tablet Take 1 tablet (10 mg total) by mouth daily. 30 tablet 11  . LORazepam (ATIVAN) 0.5 MG tablet Take 1 tablet (0.5 mg total) by mouth every 6 (six) hours as needed (Nausea or vomiting). 30 tablet 0  . metoprolol succinate (TOPROL-XL) 25 MG 24 hr tablet Take 1 tablet (25 mg total) by mouth 2 (two) times daily. 60 tablet 6  . ondansetron (ZOFRAN) 8 MG tablet Take 1 tablet (8 mg total) by mouth 2 (two) times daily as needed (Nausea or vomiting). 30 tablet 1  . oxyCODONE (OXY IR/ROXICODONE) 5 MG immediate release tablet Take 1-2 tablets (5-10 mg total) by mouth every 4 (four) hours as needed for moderate pain or severe pain. 120 tablet 0  . polyethylene glycol powder (GLYCOLAX/MIRALAX) 17 GM/SCOOP powder Take 17-34 g by mouth 2 (two) times daily as needed for moderate constipation. 500 g 5  . potassium chloride SA (KLOR-CON M20) 20 MEQ tablet Take 2 tablets (40 mEq total) by mouth 2 (two) times daily. 120 tablet 1  . prochlorperazine (COMPAZINE) 10 MG tablet Take 1 tablet (10 mg total) by mouth every 6 (six) hours as needed (Nausea or vomiting). 30 tablet 1  . senna-docusate (SENNA S) 8.6-50 MG tablet Take 2 tablets by mouth at bedtime. (Patient taking differently: Take 2 tablets by mouth at bedtime as needed for mild constipation. ) 60 tablet 2  . spironolactone (ALDACTONE) 25 MG tablet Take 1  tablet (25 mg total) by mouth daily. 30 tablet 3   No current facility-administered medications for this visit.    REVIEW OF SYSTEMS:   A 10+ POINT REVIEW OF SYSTEMS WAS OBTAINED including neurology, dermatology, psychiatry, cardiac, respiratory, lymph, extremities, GI, GU, Musculoskeletal, constitutional, breasts, reproductive, HEENT.  All pertinent positives are noted in the HPI.  All others are negative.    PHYSICAL  EXAMINATION: ECOG FS:2 - Symptomatic, <50% confined to bed  Vitals:   10/11/19 0849  BP: 108/65  Pulse: (!) 58  Resp: 16  Temp: 98.2 F (36.8 C)  SpO2: 98%   Wt Readings from Last 3 Encounters:  10/11/19 157 lb 11.2 oz (71.5 kg)  10/02/19 160 lb 15 oz (73 kg)  09/25/19 163 lb (73.9 kg)   Body mass index is 21.99 kg/m.    Exam given in a chair  GENERAL:alert, in no acute distress and comfortable SKIN: no acute rashes, no significant lesions EYES: conjunctiva are pink and non-injected, sclera anicteric OROPHARYNX: MMM, no exudates, no oropharyngeal erythema or ulceration NECK: supple, no JVD LYMPH:  no palpable lymphadenopathy in the cervical, axillary or inguinal regions LUNGS: clear to auscultation b/l with normal respiratory effort HEART: regular rate & rhythm ABDOMEN:  normoactive bowel sounds , non tender, not distended. Extremity: no pedal edema PSYCH: alert & oriented x 3 with fluent speech NEURO: no focal motor/sensory deficits   LABORATORY DATA:  I have reviewed the data as listed  . CBC Latest Ref Rng & Units 10/11/2019 10/02/2019 10/01/2019  WBC 4.0 - 10.5 K/uL 12.3(H) 5.3 7.2  Hemoglobin 13.0 - 17.0 g/dL 11.8(L) 11.2(L) 11.4(L)  Hematocrit 39.0 - 52.0 % 36.6(L) 35.5(L) 35.9(L)  Platelets 150 - 400 K/uL 187 136(L) 128(L)    . CMP Latest Ref Rng & Units 10/11/2019 10/02/2019 09/30/2019  Glucose 70 - 99 mg/dL 97 99 119(H)  BUN 8 - 23 mg/dL 30(H) 10 17  Creatinine 0.61 - 1.24 mg/dL 1.42(H) 1.08 1.22  Sodium 135 - 145 mmol/L 139 140 137  Potassium 3.5 - 5.1 mmol/L 4.8 3.7 4.6  Chloride 98 - 111 mmol/L 104 101 97(L)  CO2 22 - 32 mmol/L '26 31 30  '$ Calcium 8.9 - 10.3 mg/dL 8.9 8.3(L) 9.3  Total Protein 6.5 - 8.1 g/dL 6.1(L) - -  Total Bilirubin 0.3 - 1.2 mg/dL 0.4 - -  Alkaline Phos 38 - 126 U/L 60 - -  AST 15 - 41 U/L 30 - -  ALT 0 - 44 U/L 60(H) - -   Component     Latest Ref Rng & Units 04/30/2019  Total Protein, Urine-UPE24     Not Estab. mg/dL   Total  Protein, Urine-Ur/day     30 - 150 mg/24 hr   ALBUMIN, U     %   ALPHA 1 URINE     %   Alpha 2, Urine     %   % BETA, Urine     %   GAMMA GLOBULIN URINE     %   Free Kappa Lt Chains,Ur     0.63 - 113.79 mg/L   Free Lambda Lt Chains,Ur     0.47 - 11.77 mg/L   Free Kappa/Lambda Ratio     1.03 - 31.76   Immunofixation Result, Urine        Total Volume        M-SPIKE %, Urine     Not Observed %   M-Spike, mg/24 hr     Not  Observed mg/24 hr   NOTE:        IgG (Immunoglobin G), Serum     603 - 1,613 mg/dL 1,740 (H)  IgA     61 - 437 mg/dL 16 (L)  IgM (Immunoglobulin M), Srm     15 - 143 mg/dL 9 (L)  Total Protein ELP     6.0 - 8.5 g/dL 7.3  Albumin SerPl Elph-Mcnc     2.9 - 4.4 g/dL 3.8  Alpha 1     0.0 - 0.4 g/dL 0.3  Alpha2 Glob SerPl Elph-Mcnc     0.4 - 1.0 g/dL 0.7  B-Globulin SerPl Elph-Mcnc     0.7 - 1.3 g/dL 0.9  Gamma Glob SerPl Elph-Mcnc     0.4 - 1.8 g/dL 1.6  M Protein SerPl Elph-Mcnc     Not Observed g/dL 1.5 (H)  Globulin, Total     2.2 - 3.9 g/dL 3.5  Albumin/Glob SerPl     0.7 - 1.7 1.1  IFE 1      Comment  Please Note (HCV):      Comment  Kappa free light chain     3.3 - 19.4 mg/L 821.2 (H)  Lamda free light chains     5.7 - 26.3 mg/L 1.9 (L)  Kappa, lamda light chain ratio     0.26 - 1.65 432.21 (H)  LDH     98 - 192 U/L 132  Sed Rate     0 - 16 mm/hr 3   05/24/2019 FISH Panel     05/24/2019 Bone Marrow Biopsy    04/16/2019 Surgical Pathology:   RADIOGRAPHIC STUDIES: I have personally reviewed the radiological images as listed and agreed with the findings in the report. CT ABDOMEN PELVIS WO CONTRAST  Result Date: 09/14/2019 CLINICAL DATA:  Abdominal pain. Nausea, vomiting, diarrhea and liquid stool. History of multiple myeloma. EXAM: CT ABDOMEN AND PELVIS WITHOUT CONTRAST TECHNIQUE: Multidetector CT imaging of the abdomen and pelvis was performed following the standard protocol without IV contrast. COMPARISON:  CT angiography  03/14/2019. PET CT 04/06/2019 FINDINGS: Lower chest: Dilated distal esophagus containing enteric contrast. Minimal scarring in the right middle lobe and lingula. Minimal dependent atelectasis in the left lower lobe. No pleural fluid. Hepatobiliary: No focal liver lesion on noncontrast exam. Mild gallbladder distention without calcified gallstone or biliary dilatation. No pericholecystic inflammation. Pancreas: Parenchymal atrophy. No ductal dilatation or inflammation. Spleen: Normal in size without focal abnormality. Adrenals/Urinary Tract: Normal adrenal glands. No hydronephrosis or perinephric edema. Bilateral renal cysts. Ureters are decompressed. Urinary bladder is partially distended. Bladder displaced anteriorly by a stool ball distending the rectum. Stomach/Bowel: Enteric contrast in the distal esophagus which is dilated. Distended stomach with air and oral contrast. There is no gastric wall thickening. No evidence of gastric outlet obstruction or mass effect. No small bowel obstruction or dilatation, administered enteric contrast reaches the cecum. Appendix not well visualized, no evidence of appendicitis. Small volume of formed stool in the ascending colon, liquid stool in the transverse and descending colon. Stool within tortuous sigmoid colon. Sigmoid colonic wall thickening, series 3, image 62. Large stool ball distends the rectum with rectal distention of 8 cm. Mild perirectal fat stranding. No evidence of pneumatosis or perforation. Vascular/Lymphatic: Aorta bi-iliac atherosclerosis. Decreased size of previous retroperitoneal lymph node anterior to the IVC since prior PET, no longer visualized. No enlarged lymph nodes in the abdomen or pelvis. Reproductive: Prostate is unremarkable. Other: No ascites. No free air. Fat in the left inguinal canal.  Musculoskeletal: Multiple bone lesions involving the pelvis, largest lesion with lytic in the right acetabulum. Overall no significant change in size from  prior exam. Additional small bone lesions involving ribs. Stable endplate concavity at N00 and T12. No new osseous lesions. IMPRESSION: 1. Large stool ball distending the rectum with rectal distention and mild perirectal fat stranding. Findings consistent with fecal impaction. There is also wall thickening of the adjacent sigmoid colon with mild pericolonic edema. Possibility of stercoral colitis is raised. 2. Findings suggesting gastroparesis with gastric distension with air and contrast, as well as distended distal esophagus that is contrast filled. No evidence of gastric outlet obstruction or mechanical obstructing lesion. No bowel obstruction, administered enteric contrast reaches the cecum. 3. Decreased size of previous retroperitoneal lymph node since prior PET, no longer visualized. 4. Multiple pelvic bone lesions consistent with history of multiple myeloma, grossly unchanged in size from prior exam. Aortic Atherosclerosis (ICD10-I70.0). Electronically Signed   By: Keith Rake M.D.   On: 09/14/2019 04:05   CT HEAD WO CONTRAST  Result Date: 09/14/2019 CLINICAL DATA:  Presyncope EXAM: CT HEAD WITHOUT CONTRAST TECHNIQUE: Contiguous axial images were obtained from the base of the skull through the vertex without intravenous contrast. COMPARISON:  MRI 02/17/2017 FINDINGS: Brain: No evidence of acute infarction, hemorrhage, hydrocephalus, extra-axial collection or mass lesion/mass effect. Scattered low-density changes within the periventricular and subcortical white matter compatible with chronic microvascular ischemic change. Mild diffuse cerebral volume loss. Vascular: Mild atherosclerotic calcifications involving the large vessels of the skull base. No unexpected hyperdense vessel. Skull: Normal. Negative for fracture or focal lesion. Sinuses/Orbits: No acute finding. Other: None. IMPRESSION: 1.  No acute intracranial findings. 2.  Chronic microvascular ischemic change and cerebral volume loss.  Electronically Signed   By: Davina Poke D.O.   On: 09/14/2019 09:59   CT Angio Chest PE W/Cm &/Or Wo Cm  Result Date: 09/30/2019 CLINICAL DATA:  Hypoxia, shortness of breath EXAM: CT ANGIOGRAPHY CHEST WITH CONTRAST TECHNIQUE: Multidetector CT imaging of the chest was performed using the standard protocol during bolus administration of intravenous contrast. Multiplanar CT image reconstructions and MIPs were obtained to evaluate the vascular anatomy. CONTRAST:  47m OMNIPAQUE IOHEXOL 350 MG/ML SOLN COMPARISON:  04/06/2019 FINDINGS: Cardiovascular: Aortic Root: --Valve: 3.4 cm --Sinuses: 3.4 cm --Sinotubular Junction: 3.4 cm Limitations by motion: Mild Thoracic Aorta: --Ascending Aorta: 4.5 cm (stable since 03/14/2019) --Aortic Arch: 3.2 cm (proximal) --Descending Aorta: 3.3 cm Other: Heart size upper limits normal. Small pericardial effusion. Satisfactory opacification of pulmonary arteries noted, and there is no evidence of pulmonary emboli. Scattered coronary calcifications. Interval TAVR. Mediastinum/Nodes: No hilar or mediastinal adenopathy. Fluid distends the esophagus. Lungs/Pleura: No pleural effusion. No pneumothorax. Patchy airspace opacities in the posterior segment right upper lobe, throughout the right lower lobe, and to a minimal degree in the posterior left lung base, new since previous. Upper Abdomen: No acute findings. Musculoskeletal: Anterior vertebral endplate spurring at multiple levels in the mid and lower thoracic spine. T9 and T12 compression deformities. Review of the MIP images confirms the above findings. IMPRESSION: 1. Negative for acute PE or thoracic aortic dissection. 2. New patchy airspace opacities predominantly in the right lower lobe, also seen in the posterior upper lobe and to a minimal degree in the posterior left lung base, likely infectious/inflammatory. 3. Fluid distends the esophagus suggesting reflux disease or dysmotility. 4. Stable 4.5 cm ascending thoracic aortic  aneurysm. Recommend semi-annual imaging followup by CTA or MRA and referral to cardiothoracic surgery if not already  obtained. This recommendation follows 2010 ACCF/AHA/AATS/ACR/ASA/SCA/SCAI/SIR/STS/SVM Guidelines for the Diagnosis and Management of Patients With Thoracic Aortic Disease. Circulation. 2010; 121: [id number] Electronically Signed   By: Lucrezia Europe M.D.   On: 09/30/2019 14:46   US Renal  Result Date: 10/01/2019 CLINICAL DATA:  Acute renal injury EXAM: RENAL / URINARY TRACT ULTRASOUND COMPLETE COMPARISON:  None. FINDINGS: Right Kidney: Renal measurements: 11.3 x 5.2 x 5.2 cm = volume: 159 mL. No hydronephrosis is noted. A 1.2 cm upper pole cyst is seen. Left Kidney: Renal measurements: 11.0 x 5.5 x 5.1 cm = volume: 160 mL. No hydronephrosis is noted. 3.3 cm cyst is noted in the upper pole. Bladder: Well distended. Multiple bladder diverticula are noted. Prostate is mildly prominent. Other: None. IMPRESSION: Bilateral renal cysts.  No obstructive changes are noted. Multiple bladder diverticula. Electronically Signed   By: Inez Catalina M.D.   On: 10/01/2019 11:59   DG Chest Portable 1 View  Result Date: 09/30/2019 CLINICAL DATA:  79 year old male with history of hypoxia. Polyuria. Generalized weakness. EXAM: PORTABLE CHEST 1 VIEW COMPARISON:  Chest x-ray 09/14/2019. FINDINGS: Elongated nodular density projecting over the right upper lobe, new compared to the prior study. No pleural effusions. No evidence of pulmonary edema. No pneumothorax. Heart size is normal. Upper mediastinal contours are within normal limits. Status post transcatheter aortic valve replacement. IMPRESSION: 1. New elongated nodular density projecting over the region of the right upper lobe, presumably infectious or inflammatory in etiology given the rapid development over the past 2 weeks. This could be better evaluated with follow-up nonemergent noncontrast chest CT if clinically appropriate. At the very least, follow-up standing  PA and lateral chest radiographs are recommended in the next 2-3 weeks to ensure the stability or resolution of this finding. 2. Aortic atherosclerosis. 3. Status post TAVR. Electronically Signed   By: Vinnie Langton M.D.   On: 09/30/2019 12:50   DG Chest Port 1 View  Result Date: 09/14/2019 CLINICAL DATA:  Weakness. EXAM: PORTABLE CHEST 1 VIEW COMPARISON:  Radiograph 05/09/2019 FINDINGS: Normal heart size and mediastinal contours. TAVR. No pulmonary edema, focal airspace disease, pleural effusion or pneumothorax. No acute osseous abnormalities are seen. IMPRESSION: No acute chest findings. Electronically Signed   By: Keith Rake M.D.   On: 09/14/2019 02:23   DG Swallowing Func-Speech Pathology  Result Date: 10/02/2019 Objective Swallowing Evaluation: Type of Study: MBS-Modified Barium Swallow Study  Patient Details Name: Gavin Telford MRN: [id number] Date of Birth: 12-18-1940 Today's Date: 10/02/2019 Time: SLP Start Time (ACUTE ONLY): 1000 -SLP Stop Time (ACUTE ONLY): 1020 SLP Time Calculation (min) (ACUTE ONLY): 20 min Past Medical History: Past Medical History: Diagnosis Date . Ascending aortic aneurysm ([place])  . Bicuspid aortic valve  . Cancer ([place])  . CHF NYHA class I (no symptoms from ordinary activities), acute, diastolic ([place])  . Fatty liver   mild . GI bleeding 07/21/2018  post polypectomy . Hemorrhoids  . HTN (hypertension)  . Hypercholesteremia  . Hypokalemia  . Internal hemorrhoids  . LBP (low back pain)  . Moderate aortic stenosis  . Osteoarthritis  . Paroxysmal atrial fibrillation ([place])   a. new onset Afib in 07/2008. He underwent ibutilide cardioversion successfully. b. Recurrence 01/2013 s/p TEE/DCCV - was on Xarelto but he stopped it as he was convinced it was causing joint pn. c. Recurrence 01/2016 - spont conv to NSR. Pt took Eliquis x1 mo then declined further anticoag. d. Recurrence 07/2016. [name] Pneumonia  . Tubular adenoma of colon  Past Surgical History:  Past Surgical History: Procedure  Laterality Date . BACK SURGERY  x12 years ago . CARDIOVERSION N/A 01/26/2013  Procedure: CARDIOVERSION;  Surgeon: Larey Dresser, MD;  Location: [facility] ENDOSCOPY;  Service: Cardiovascular;  Laterality: N/A; . CARDIOVERSION N/A 10/28/2017  Procedure: CARDIOVERSION;  Surgeon: Larey Dresser, MD;  Location: [facility] ENDOSCOPY;  Service: Cardiovascular;  Laterality: N/A; . CARDIOVERSION N/A 03/03/2018  Procedure: CARDIOVERSION;  Surgeon: Lelon Perla, MD;  Location: [facility] ENDOSCOPY;  Service: Cardiovascular;  Laterality: N/A; . CARDIOVERSION N/A 09/19/2019  Procedure: CARDIOVERSION;  Surgeon: Larey Dresser, MD;  Location: [facility] ENDOSCOPY;  Service: Cardiovascular;  Laterality: N/A; . COLONOSCOPY   . COLONOSCOPY  07/17/2018  at [facility] . HEMORRHOID SURGERY   . LUMBAR LAMINECTOMY   . POLYPECTOMY   . RIGHT HEART CATH N/A 08/20/2019  Procedure: RIGHT HEART CATH;  Surgeon: Larey Dresser, MD;  Location: [place] CV LAB;  Service: Cardiovascular;  Laterality: N/A; . RIGHT/LEFT HEART CATH AND CORONARY ANGIOGRAPHY N/A 03/07/2019  Procedure: RIGHT/LEFT HEART CATH AND CORONARY ANGIOGRAPHY;  Surgeon: Burnell Blanks, MD;  Location: [place] CV LAB;  Service: Cardiovascular;  Laterality: N/A; . [name]  04/2019 . TEE WITHOUT CARDIOVERSION N/A 01/26/2013  Procedure: TRANSESOPHAGEAL ECHOCARDIOGRAM (TEE);  Surgeon: Larey Dresser, MD;  Location: [place];  Service: Cardiovascular;  Laterality: N/A; . TEE WITHOUT CARDIOVERSION N/A 10/28/2017  Procedure: TRANSESOPHAGEAL ECHOCARDIOGRAM (TEE);  Surgeon: Larey Dresser, MD;  Location: [facility] ENDOSCOPY;  Service: Cardiovascular;  Laterality: N/A; . TEE WITHOUT CARDIOVERSION N/A 05/08/2019  Procedure: TRANSESOPHAGEAL ECHOCARDIOGRAM (TEE);  Surgeon: Burnell Blanks, MD;  Location: [place] CV LAB;  Service: Open Heart Surgery;  Laterality: N/A; . TEE WITHOUT CARDIOVERSION N/A 09/19/2019  Procedure: TRANSESOPHAGEAL ECHOCARDIOGRAM (TEE);  Surgeon: Larey Dresser, MD;  Location: [facility]  ENDOSCOPY;  Service: Cardiovascular;  Laterality: N/A; . TRANSCATHETER AORTIC VALVE REPLACEMENT, TRANSFEMORAL N/A 05/08/2019  Procedure: TRANSCATHETER AORTIC VALVE REPLACEMENT, TRANSFEMORAL;  Surgeon: Burnell Blanks, MD;  Location: [place] CV LAB;  Service: Open Heart Surgery;  Laterality: N/A; HPI:  79 y.o. male with medical history significant of A. Fib on eliquis, multiple myeloma, ascending aortic aneurysm, hypertension, hyperlipidemia, moderate aortic stenosis, chronic diastolic heart failure status post TAVR's in August 2020 presented with polyuria and generalized weakness for 2 days. Subjective: Pt seen in radiology for MBS Assessment / Plan / Recommendation CHL IP CLINICAL IMPRESSIONS 10/02/2019 Clinical Impression Pt presents with mild pharyngeal dysphagia, characterized by swallow reflex trigger at the level of the vallecular sinus across consistencies, with occasional trigger at the pyriform sinus. Trace flash penetration was noted x1 on thin liquids during large and consecutive boluses. This was not replicated despite multiple repetitions.  No other penetration or aspiration was seen on any consistency tested. Trace vallecular residue was noted following the swallow of soft solids, otherwise no post-swallow residue seen. Esophageal sweep revealed a small Zenker's diverticulum in the upper esophagus (confirmed by radiologist). Recommend continuing with current (regular) diet and thin liquids. Safe swallow precautions sent back to pt room with transport. No further ST intervention is recommended at this time.  SLP Visit Diagnosis Dysphagia, oropharyngeal phase (R13.12)     Impact on safety and function Mild aspiration risk   CHL IP TREATMENT RECOMMENDATION 10/02/2019 Treatment Recommendations No treatment recommended at this time   Prognosis 10/02/2019 Prognosis for Safe Diet Advancement Good     CHL IP DIET RECOMMENDATION 10/02/2019 SLP Diet Recommendations Regular solids;Thin liquid Liquid  Administration via Cup;Straw Medication Administration Whole meds with liquid Compensations Slow  rate;Small sips/bites Postural Changes Seated upright at 90 degrees   CHL IP OTHER RECOMMENDATIONS 10/02/2019   Oral Care Recommendations Oral care BID     CHL IP FOLLOW UP RECOMMENDATIONS 10/02/2019 Follow up Recommendations None      CHL IP ORAL PHASE 10/02/2019 Oral Phase WFL  CHL IP PHARYNGEAL PHASE 10/02/2019 Pharyngeal Phase Impaired   Pharyngeal- Nectar Cup Delayed swallow initiation-vallecula;Delayed swallow initiation-pyriform sinuses Pharyngeal- Thin Cup Delayed swallow initiation-vallecula;Delayed swallow initiation-pyriform sinuses;Reduced airway/laryngeal closure;Penetration/Aspiration during swallow Pharyngeal Material does not enter airway;Material enters airway, remains ABOVE vocal cords then ejected out Pharyngeal- Puree Delayed swallow initiation-vallecula Pharyngeal- Mechanical Soft Delayed swallow initiation-vallecula;Pharyngeal residue - valleculae Pharyngeal- Pill WFL  CHL IP CERVICAL ESOPHAGEAL PHASE 10/02/2019 Cervical Esophageal Phase Impaired - small Zenker's diverticulum noted. Confirmed by radiologist. Enriqueta Shutter. Quentin Ore, [facility] [place] Speech Language Pathologist Office: [phone] Pager: [phone] Shonna Chock 10/02/2019, 11:41 AM              ECHO TEE  Result Date: 09/24/2019   TRANSESOPHOGEAL ECHO REPORT   Patient Name:   [facility] Date of Exam: 09/19/2019 Medical Rec #:  [id number]     Height:       71.0 in Accession #:    [id number]    Weight:       165.8 lb Date of Birth:  21-Jun-1941     BSA:          1.95 m Patient Age:    17 years      BP:           148/99 mmHg Patient Gender: M             HR:           93 bpm. Exam Location:  Inpatient  Procedure: Transesophageal Echo, Color Doppler and Cardiac Doppler Indications:     I48.91* Unspecified atrial fibrillation  History:         Patient has prior history of Echocardiogram examinations, most                  recent 05/08/2019. Two  46m Edwards Sapien 3 Ultra TAVRs                  implanted valve-in-valve on 05/08/19.  Sonographer:     ERaquel SarnaSenior RDCS Referring Phys:  [id number] D CLEGG Diagnosing Phys: DLoralie ChampagneMD  PROCEDURE: Patients was monitored while under deep sedation. The transesophogeal probe was passed through the esophogus of the patient. The patient developed no complications during the procedure. IMPRESSIONS  1. Left ventricular ejection fraction, by visual estimation, is 60 to 65%. The left ventricle has normal function. There is moderately increased left ventricular hypertrophy.  2. The left ventricle has no regional wall motion abnormalities.  3. Global right ventricle has normal systolic function.The right ventricular size is normal. No increase in right ventricular wall thickness.  4. Left atrial size was mildly dilated. No LA appendage thrombus.  5. Right atrial size was normal.  6. No PFO/ASD noted by color doppler.  7. The tricuspid valve is normal in structure.  8. S/p valve-in-valve TAVR with bioprosthetic aortic valve. The valve was well-seated with no significant peri-valvular leakage noted and mean gradient 8 mmHg (may be underestimated).  9. The mitral valve is normal in structure. Mild mitral valve regurgitation. No evidence of mitral stenosis. 10. The pulmonic valve was normal in structure. Pulmonic valve regurgitation is trivial. 11. Ascending aorta measured 4.5 cm. There was mild plaque in  the descending thoracic aorta. FINDINGS  Left Ventricle: Left ventricular ejection fraction, by visual estimation, is 60 to 65%. The left ventricle has normal function. The left ventricle has no regional wall motion abnormalities. The left ventricular internal cavity size was the left ventricle is normal in size. There is moderately increased left ventricular hypertrophy. Right Ventricle: The right ventricular size is normal. No increase in right ventricular wall thickness. Global RV systolic function is has normal  systolic function. Left Atrium: Left atrial size was mildly dilated. Right Atrium: Right atrial size was normal in size Pericardium: There is no evidence of pericardial effusion. Mitral Valve: The mitral valve is normal in structure. No evidence of mitral valve stenosis by observation. Mild mitral valve regurgitation. Tricuspid Valve: The tricuspid valve is normal in structure. Tricuspid valve regurgitation is trivial. Aortic Valve: The aortic valve has been repaired/replaced. Aortic valve regurgitation is not visualized. Aortic valve mean gradient measures 8.0 mmHg. Aortic valve peak gradient measures 23.6 mmHg. S/p valve-in-valve TAVR with bioprosthetic aortic valve.  The valve was well-seated with no significant peri-valvular leakage noted and mean gradient 8 mmHg (may be underestimated). Pulmonic Valve: The pulmonic valve was normal in structure. Pulmonic valve regurgitation is trivial. Aorta: The aortic root is normal in size and structure. Ascending aorta measured 4.5 cm. There was mild plaque in the descending thoracic aorta. Shunts: No atrial level shunt detected by color flow Doppler.  AORTIC VALVE               Normals AV Vmax:      243.00 cm/s AV Vmean:     129.000 cm/s 77 cm/s AV VTI:       0.261 m      3.15 cm2 AV Peak Grad: 23.6 mmHg AV Mean Grad: 8.0 mmHg     3 mmHg  [name] MD Electronically signed by [name] MD Signature Date/Time: 09/24/2019/2:22:51 PM    Final    ECHOCARDIOGRAM LIMITED  Result Date: 09/19/2019   ECHOCARDIOGRAM LIMITED REPORT   Patient Name:   [facility] Date of Exam: 09/19/2019 Medical Rec #:  [id number]     Height:       66.0 in Accession #:    [id number]    Weight:       165.8 lb Date of Birth:  24-Dec-1940     BSA:          1.85 m Patient Age:    31 years      BP:           135/96 mmHg Patient Gender: M             HR:           104 bpm. Exam Location:  Inpatient  Procedure: 3D Echo, Color Doppler and Cardiac Doppler Indications:    CHF-Acute Diastolic 790.24 /  O97.35  History:        Patient has prior history of Echocardiogram examinations, most                 recent 05/30/2019. Aortic Valve: A Two 24m Edwards Sapien                 bioprosthetic, stented aortic valve (TAVR) Procedure Date:                 05/08/2019 Chronic kidney disease. Multiple myeloma.  Sonographer:    TDarlina SicilianRDCS Referring Phys: 3[place] 1. Left ventricular ejection fraction, by visual estimation,  is 60 to 65%. The left ventricle has normal function. There is mildly increased left ventricular wall thickness.  2. Global right ventricle has normal systolic function.The right ventricular size is normal.  3. Small to moderate pericardial effusion, measures up to 1.0cm adjacent to RV free wall  4. The inferior vena cava is normal in size with greater than 50% respiratory variability, suggesting right atrial pressure of 3 mmHg.  5. S/p bioprosthetic AVR. No aortic regurgitation. Bioprosthesis functioning normally. MG 10 mmHg, Vmax 2.3 m/s, DI 0.52  6. Compared to prior TTE on 05/30/19, gradient across bioprosthetic aortic valve is decreased FINDINGS  Left Ventricle: Left ventricular ejection fraction, by visual estimation, is 60 to 65%. The left ventricle has normal function. There is mildly increased left ventricular wall thickness. Left ventricular diastolic parameters are indeterminate. Right Ventricle: The right ventricular size is normal. No increase in right ventricular wall thickness. Global RV systolic function is has normal systolic function. Pericardium: A small pericardial effusion is present is seen. A small pericardial effusion is present. Mitral Valve: The mitral valve is normal in structure. No evidence of mitral valve regurgitation. Tricuspid Valve: The tricuspid valve is normal in structure. Tricuspid valve regurgitation is not demonstrated. Aortic Valve: Aortic valve regurgitation is not visualized. Aortic valve mean gradient measures 8.5 mmHg. Aortic  valve peak gradient measures 17.1 mmHg. Aortic valve area, by VTI measures 1.80 cm. Two 54m Edwards Sapien bioprosthetic, stented aortic valve (TAVR) valve is present in the aortic position. Procedure Date: 05/08/2019. Pulmonic Valve: The pulmonic valve was not well visualized. Pulmonic valve regurgitation is not visualized by color flow Doppler. Pulmonic regurgitation is not visualized by color flow Doppler. Aorta: The aortic root is normal in size and structure. Venous: The inferior vena cava is normal in size with greater than 50% respiratory variability, suggesting right atrial pressure of 3 mmHg.  LEFT VENTRICLE          Normals PLAX 2D LVIDd:         3.90 cm  3.6 cm LVIDs:         2.90 cm  1.7 cm LV PW:         1.10 cm  1.4 cm LV IVS:        1.40 cm  1.3 cm LVOT diam:     2.00 cm  2.0 cm LV SV:         34 ml    79 ml LV SV Index:   17.86    45 ml/m2 LVOT Area:     3.14 cm 3.14 cm2  LEFT ATRIUM         Index LA diam:    3.50 cm 1.90 cm/m  AORTIC VALVE                    Normals AV Area (Vmax):    1.38 cm AV Area (Vmean):   1.65 cm     3.06 cm2 AV Area (VTI):     1.80 cm AV Vmax:           206.75 cm/s AV Vmean:          134.250 cm/s 77 cm/s AV VTI:            0.271 m      3.15 cm2 AV Peak Grad:      17.1 mmHg AV Mean Grad:      8.5 mmHg     3 mmHg LVOT Vmax:  90.80 cm/s LVOT Vmean:        70.700 cm/s  75 cm/s LVOT VTI:          0.156 m      25.3 cm LVOT/AV VTI ratio: 0.57         1  AORTA                 Normals Ao Root diam: 2.50 cm 31 mm  SHUNTS Systemic VTI:  0.16 m Systemic Diam: 2.00 cm  Oswaldo Milian MD Electronically signed by Oswaldo Milian MD Signature Date/Time: 09/19/2019/3:26:18 PMThe mitral valve is normal in structure.    Final     ASSESSMENT & PLAN:   79 yo   #1 Recently diagnosed Plasma cell myeloma  03/28/2019 MRI pelvis w/wo contrast revealed "1. Destructive bone lesions as detailed above. Findings most consistent with metastatic disease. PET-CT may be helpful  for further evaluation and to establish a primary tumor. The right pelvic bone lesions should be amenable to image guided biopsy but a PET scan may demonstrate easier/safer biopsy sites. 2. No intrapelvic mass or adenopathy. 3. Benign intraosseous lipoma involving the left anterior superior acetabulum."  04/06/2019 PET whole body revealed "1. Diffuse osseous metastatic disease as detailed above without findings for a primary neoplasm in the chest, abdomen or pelvis. The large destructive lesion involving the right ischium should be amenable to image guided biopsy. 2. Two small retroperitoneal lymph nodes and 1 small right obturator node showing hypermetabolism."   04/16/2019 Posterior right pelvis bone biopsy revealed "PLASMA CELL NEOPLASM"  05/24/2019 Bone Marrow Biopsy revealed "BONE MARROW: - CELLULAR MARROW WITH INVOLVEMENT BY PLASMA CELL NEOPLASM (20%) PERIPHERAL BLOOD: - MORPHOLOGICALLY UNREMARKABLE"  05/24/2019 FISH Panel revealed "ABNORMAL result with 11q+, 14q+ and +17"  #2 Severe aortic stenosis with bicuspid aortic valve -10/26/2018 ECHO revealed AVA at 0.8 cm2 and LV EF of 60-65% -05/08/2019 pt had a Transfemoral Transcatheter Aortic Valve Replacement   PLAN: -Discussed pt labwork today, 10/11/19;  all values are WNL except for WBC at 12.3, RBC at 3.98, Hemoglobing at 11.8, MCV at 36.6,Neutro Abs at 10.1, Abs Immature Granulocytes at 0.09, BUN at 30, Creatinine at 1.42, Total protein at 6.1, ALT at 60, GFR Est Non Af Am at 47, GFR Est AFR AM at 54 PENDING Kappa and MMP. -Discussed that he has been off of his myeloma treatment for almost 3 months. Will continue to hold treatment -Due to ischemic colitis we will no longer continue revlimid. We have also stopped Velcade due to leg neuropathy. Possible switch to daratumumab depending on MMP. -Discussed that he is slightly dehydrated. He is urinating every hour at night. He currently taking 20 mg of Lasix a day. We will hold Lasix for a few  days and I recommended increases water intake and increase eating. -Recommended to elevate legs and wearing compression socks.Advised to monitor weight to determine water retention. -Discussed COVID-19 vaccine. He has not received it.  -Will hold today's Zometa infusion. Creatinine at 1.42  FOLLOW UP: Phone visit with Dr Irene Limbo in 2 weeks  The total time spent in the appt was 40 minutes and more than 50% was on counseling and direct patient cares.  All of the patient's questions were answered with apparent satisfaction. The patient knows to call the clinic with any problems, questions or concerns.  Sullivan Lone MD [place] AAHIVMS [facility] Hematology/Oncology Physician [facility]  (Office):       [phone] (Work cell):  [phone] (Fax):  [phone]  10/10/2019 9:50 PM  I, Scot Dock, am acting as a scribe for Dr. Sullivan Lone.   .I have reviewed the above documentation for accuracy and completeness, and I agree with the above. Brunetta Genera MD

## 2019-10-11 ENCOUNTER — Other Ambulatory Visit: Payer: Self-pay

## 2019-10-11 ENCOUNTER — Inpatient Hospital Stay: Payer: Medicare Other

## 2019-10-11 ENCOUNTER — Inpatient Hospital Stay (HOSPITAL_BASED_OUTPATIENT_CLINIC_OR_DEPARTMENT_OTHER): Payer: Medicare Other | Admitting: Hematology

## 2019-10-11 ENCOUNTER — Inpatient Hospital Stay: Payer: Medicare Other | Attending: Hematology

## 2019-10-11 VITALS — BP 108/65 | HR 58 | Temp 98.2°F | Resp 16 | Ht 71.0 in | Wt 157.7 lb

## 2019-10-11 DIAGNOSIS — K559 Vascular disorder of intestine, unspecified: Secondary | ICD-10-CM | POA: Insufficient documentation

## 2019-10-11 DIAGNOSIS — E860 Dehydration: Secondary | ICD-10-CM | POA: Diagnosis not present

## 2019-10-11 DIAGNOSIS — C7951 Secondary malignant neoplasm of bone: Secondary | ICD-10-CM

## 2019-10-11 DIAGNOSIS — G629 Polyneuropathy, unspecified: Secondary | ICD-10-CM | POA: Insufficient documentation

## 2019-10-11 DIAGNOSIS — I480 Paroxysmal atrial fibrillation: Secondary | ICD-10-CM | POA: Diagnosis not present

## 2019-10-11 DIAGNOSIS — C9000 Multiple myeloma not having achieved remission: Secondary | ICD-10-CM

## 2019-10-11 DIAGNOSIS — Z79899 Other long term (current) drug therapy: Secondary | ICD-10-CM | POA: Insufficient documentation

## 2019-10-11 DIAGNOSIS — K760 Fatty (change of) liver, not elsewhere classified: Secondary | ICD-10-CM | POA: Diagnosis not present

## 2019-10-11 DIAGNOSIS — R634 Abnormal weight loss: Secondary | ICD-10-CM | POA: Diagnosis not present

## 2019-10-11 DIAGNOSIS — M1990 Unspecified osteoarthritis, unspecified site: Secondary | ICD-10-CM | POA: Diagnosis not present

## 2019-10-11 LAB — CMP (CANCER CENTER ONLY)
ALT: 60 U/L — ABNORMAL HIGH (ref 0–44)
AST: 30 U/L (ref 15–41)
Albumin: 4.2 g/dL (ref 3.5–5.0)
Alkaline Phosphatase: 60 U/L (ref 38–126)
Anion gap: 9 (ref 5–15)
BUN: 30 mg/dL — ABNORMAL HIGH (ref 8–23)
CO2: 26 mmol/L (ref 22–32)
Calcium: 8.9 mg/dL (ref 8.9–10.3)
Chloride: 104 mmol/L (ref 98–111)
Creatinine: 1.42 mg/dL — ABNORMAL HIGH (ref 0.61–1.24)
GFR, Est AFR Am: 54 mL/min — ABNORMAL LOW (ref >60)
GFR, Estimated: 47 mL/min — ABNORMAL LOW (ref >60)
Glucose, Bld: 97 mg/dL (ref 70–99)
Potassium: 4.8 mmol/L (ref 3.5–5.1)
Sodium: 139 mmol/L (ref 135–145)
Total Bilirubin: 0.4 mg/dL (ref 0.3–1.2)
Total Protein: 6.1 g/dL — ABNORMAL LOW (ref 6.5–8.1)

## 2019-10-11 LAB — CBC WITH DIFFERENTIAL/PLATELET
Abs Immature Granulocytes: 0.09 10*3/uL — ABNORMAL HIGH (ref 0.00–0.07)
Basophils Absolute: 0 10*3/uL (ref 0.0–0.1)
Basophils Relative: 0 %
Eosinophils Absolute: 0.1 10*3/uL (ref 0.0–0.5)
Eosinophils Relative: 1 %
HCT: 36.6 % — ABNORMAL LOW (ref 39.0–52.0)
Hemoglobin: 11.8 g/dL — ABNORMAL LOW (ref 13.0–17.0)
Immature Granulocytes: 1 %
Lymphocytes Relative: 9 %
Lymphs Abs: 1.1 10*3/uL (ref 0.7–4.0)
MCH: 29.6 pg (ref 26.0–34.0)
MCHC: 32.2 g/dL (ref 30.0–36.0)
MCV: 92 fL (ref 80.0–100.0)
Monocytes Absolute: 0.9 10*3/uL (ref 0.1–1.0)
Monocytes Relative: 7 %
Neutro Abs: 10.1 10*3/uL — ABNORMAL HIGH (ref 1.7–7.7)
Neutrophils Relative %: 82 %
Platelets: 187 10*3/uL (ref 150–400)
RBC: 3.98 MIL/uL — ABNORMAL LOW (ref 4.22–5.81)
RDW: 13.9 % (ref 11.5–15.5)
WBC: 12.3 10*3/uL — ABNORMAL HIGH (ref 4.0–10.5)
nRBC: 0 % (ref 0.0–0.2)

## 2019-10-12 ENCOUNTER — Telehealth: Payer: Self-pay | Admitting: Pharmacist

## 2019-10-12 ENCOUNTER — Other Ambulatory Visit: Payer: Self-pay | Admitting: *Deleted

## 2019-10-12 DIAGNOSIS — C7951 Secondary malignant neoplasm of bone: Secondary | ICD-10-CM | POA: Diagnosis not present

## 2019-10-12 DIAGNOSIS — K559 Vascular disorder of intestine, unspecified: Secondary | ICD-10-CM | POA: Diagnosis not present

## 2019-10-12 DIAGNOSIS — T451X5D Adverse effect of antineoplastic and immunosuppressive drugs, subsequent encounter: Secondary | ICD-10-CM | POA: Diagnosis not present

## 2019-10-12 DIAGNOSIS — I130 Hypertensive heart and chronic kidney disease with heart failure and stage 1 through stage 4 chronic kidney disease, or unspecified chronic kidney disease: Secondary | ICD-10-CM | POA: Diagnosis not present

## 2019-10-12 DIAGNOSIS — C9000 Multiple myeloma not having achieved remission: Secondary | ICD-10-CM | POA: Diagnosis not present

## 2019-10-12 DIAGNOSIS — G620 Drug-induced polyneuropathy: Secondary | ICD-10-CM | POA: Diagnosis not present

## 2019-10-12 LAB — KAPPA/LAMBDA LIGHT CHAINS
Kappa free light chain: 7.3 mg/L (ref 3.3–19.4)
Kappa, lambda light chain ratio: 0.86 (ref 0.26–1.65)
Lambda free light chains: 8.5 mg/L (ref 5.7–26.3)

## 2019-10-12 NOTE — Patient Outreach (Signed)
Telephone outreach.  Talked with Mrs. Ludtke today, pt was with therapist. She reports he is progressing well with the therapy.  They went to oncology yesterday and Dr. Irene Limbo feels he is too weak and his labs are not up to levels that indicate another tx is appropriate at this time.  We briefly discussed next steps if txs cannot continue (palliative care) and continued support from [facility] until that time.  They still have not been able to get the PRO-STAT for a reasonable cost with their part D plan which does not have it on their formulary. Suggested talking with Dr. Alain Marion to see if it can be filed on his part B. NP to outreach to [place] for possible options.  NP to continue to call weekly at this time to offer support.  Eulah Pont. Myrtie Neither, MSN, [facility] Gerontological Nurse Practitioner [facility] Care Management [phone]

## 2019-10-12 NOTE — Patient Outreach (Addendum)
[facility]) Care Management  [place]   10/12/2019  Abbie Jablon 03-14-1941 [id number]  Reason for referral: medication assistance  Referral source: [facility] NP Referral medication(s): Prostat Current insurance:[employer]  HPI:   Patient is a 79 year old male with multiple medical conditions including but not limited to:  A fib, multiple myeloma, ascending aortic aneurysm, hypertension, hyperlipidemia, aortic stenosis, and CHF.  He was recently hospitalized for leg weakness and polyuria.   Spoke to patient's wife. He has lost some weight and was started on ProStat 101 nutritional supplement while hospitalized. When they went to pick the ProStat 101 up from [facility], it was $35 per bottle. They wondered if it could be obtained cheaper or billed through their insurance.   Objective: Allergies  Allergen Reactions  . Xarelto [Rivaroxaban] Other (See Comments) and Hypertension    INCREASED BP-HYPERTENSIVE EVENTS  . Ambien [Zolpidem]     Hallucinations   . Corticosteroids Other (See Comments)    Made the patient  "sick," feel "weird," and his "body rejected" them   . Ramipril Other (See Comments)    Could not eat or sleep, lost muscle mass  . Benazepril Cough    Medications Reviewed Today    Reviewed by Elayne Guerin, [facility] (Pharmacist) on 10/12/19 at 1452  Med List Status: <None>  Medication Order Taking? Sig Documenting Provider Last Dose Status Informant  acyclovir (ZOVIRAX) 400 MG tablet [id number] Yes Take 1 tablet (400 mg total) by mouth 2 (two) times daily. Brunetta Genera, MD Taking Active Spouse/Significant Other  Amino Acids-Protein Hydrolys (PRO-STAT 101) LIQD [id number] No Take 30 mLs by mouth daily.  Patient not taking: Reported on 10/12/2019   [name], [name], MD Not Taking Active            Med Note Casimiro Needle Oct 11, 2019  9:02 AM) Not covered by insurance  amiodarone (PACERONE) 200 MG tablet [id number] Yes Take 1 tablet  (200 mg total) by mouth daily. Barton Dubois, MD Taking Active   amLODipine (NORVASC) 5 MG tablet [id number] Yes Take 1 tablet (5 mg total) by mouth daily. Charlynne Cousins, MD Taking Active Spouse/Significant Other  b complex vitamins tablet [id number] Yes Take 1 tablet by mouth daily. [name], [name], MD Taking Active Spouse/Significant Other  Calcium-Magnesium 500-250 MG TABS [id number] Yes Take 1 tablet by mouth daily. [provider] Taking Active Spouse/Significant Other  Carboxymethylcellul-Glycerin (LUBRICATING EYE DROPS OP) [id number] Yes Place 1 drop into both eyes daily as needed (dry eyes). [provider] Taking Active Spouse/Significant Other  Cholecalciferol (VITAMIN D) 50 MCG (2000 UT) tablet [id number] Yes Take 2,000 Units by mouth daily. [provider] Taking Active Spouse/Significant Other  dexamethasone (DECADRON) 4 MG tablet [id number] Yes Take 3 tablets (12 mg total) by mouth once a week.  Patient taking differently: Take 12 mg by mouth every Monday.    Brunetta Genera, MD Taking Active Spouse/Significant Other  diclofenac Sodium (VOLTAREN) 1 % GEL [id number] Yes Apply 1 application topically 4 (four) times daily as needed (pain). [provider] Taking Active Spouse/Significant Other  DULoxetine (CYMBALTA) 60 MG capsule [id number] Yes Take 1 capsule (60 mg total) by mouth daily. Brunetta Genera, MD Taking Active Spouse/Significant Other           Med Note Burna Sis Sep 14, 2019  8:23 AM)    Arne Cleveland 5 MG TABS tablet [id number] Yes TAKE 1 TABLET BY MOUTH TWICE A  DAY  Patient taking differently: Take 5 mg by mouth 2 (two) times daily.    Larey Dresser, MD Taking Active Spouse/Significant Other  ergocalciferol (VITAMIN D2) 1.25 MG (50000 UT) capsule [id number] Yes Take 1 capsule (50,000 Units total) by mouth once a week. Brunetta Genera, MD Taking Active Spouse/Significant Other           Med Note ([place],  Joya Salm Sep 30, 2019  5:18 PM) Take on Mondays   famotidine (PEPCID) 40 MG tablet [id number] Yes Take 1 tablet (40 mg total) by mouth daily. [name], [name], MD Taking Active Spouse/Significant Other  fentaNYL (DURAGESIC) 12 MCG/HR [id number] Yes Place 1 patch onto the skin every 3 (three) days. Brunetta Genera, MD Taking Active Spouse/Significant Other           Med Note Amalia Hailey, CHRISTY R   Mon Sep 10, 2019 11:15 AM)    furosemide (LASIX) 20 MG tablet [id number] Yes Take 1 tablet (20 mg total) by mouth daily.  Patient taking differently: Take 20 mg by mouth daily. Taking every other day   Charlynne Cousins, MD Taking Active Spouse/Significant Other  gabapentin (NEURONTIN) 300 MG capsule [id number] Yes Take 1 capsule (300 mg total) by mouth 3 (three) times daily. Barton Dubois, MD Taking Active   loratadine (CLARITIN) 10 MG tablet [id number] Yes Take 1 tablet (10 mg total) by mouth daily. [name], [name], MD Taking Active Spouse/Significant Other           Med Note Mady Haagensen Oct 04, 2019 11:29 AM) Advised to stop until sees Dr. Mignon Pine. Pt does not have allergies.  LORazepam (ATIVAN) 0.5 MG tablet [id number] Yes Take 1 tablet (0.5 mg total) by mouth every 6 (six) hours as needed (Nausea or vomiting). Brunetta Genera, MD Taking Active Spouse/Significant Other  metoprolol succinate (TOPROL-XL) 25 MG 24 hr tablet [id number] Yes Take 1 tablet (25 mg total) by mouth 2 (two) times daily. Charlynne Cousins, MD Taking Active Spouse/Significant Other  ondansetron [facility]) 8 MG tablet [id number] Yes Take 1 tablet (8 mg total) by mouth 2 (two) times daily as needed (Nausea or vomiting). Brunetta Genera, MD Taking Active Spouse/Significant Other  oxyCODONE (OXY IR/ROXICODONE) 5 MG immediate release tablet [id number] Yes Take 1-2 tablets (5-10 mg total) by mouth every 4 (four) hours as needed for moderate pain or severe pain. Brunetta Genera, MD Taking Active    polyethylene glycol powder [facility]) 17 GM/SCOOP powder [id number] Yes Take 17-34 g by mouth 2 (two) times daily as needed for moderate constipation. [name], [name], MD Taking Active Spouse/Significant Other  potassium chloride SA (KLOR-CON M20) 20 MEQ tablet [id number] Yes Take 2 tablets (40 mEq total) by mouth 2 (two) times daily. Brunetta Genera, MD Taking Active Spouse/Significant Other  prochlorperazine (COMPAZINE) 10 MG tablet [id number] Yes Take 1 tablet (10 mg total) by mouth every 6 (six) hours as needed (Nausea or vomiting). Brunetta Genera, MD Taking Active Spouse/Significant Other  senna-docusate (SENNA S) 8.6-50 MG tablet [id number] Yes Take 2 tablets by mouth at bedtime.  Patient taking differently: Take 2 tablets by mouth at bedtime as needed for mild constipation.    Brunetta Genera, MD Taking Active Spouse/Significant Other  spironolactone (ALDACTONE) 25 MG tablet [id number] Yes Take 1 tablet (25 mg total) by mouth daily. Charlynne Cousins, MD Taking Active Spouse/Significant Other          Assessment: ProStat 101 cannot be billed  through Medicare Part D  Called patient's insurance to obtain the names of preferred DME providers.  Called [place] to a representative who stated ProStat 101 was not covered.  Researched the [employer]. Nutritional supplements are often NOT covered unless the patient has diabetes or is on dialysis.  Also researched online for cheaper prices of Prostat 101. Most ranged from $26-$46 per bottle. Found the Prostat Packets for $3.99 each. The packets can be used on the go and mixed with a liquid to drink.  Plan:  Patient's wife communicated understanding and was very appreciative of the phone calls made.  She said she will continue to purchase the ProStat [facility] for $35 per bottle.  Patient's pharmacy case will be closed.  Will gladly reopen patient's case upon request.  Will alert [facility] staff  still involved in the patient's care.  Elayne Guerin, PharmD, [place] Clinical Pharmacist [phone]

## 2019-10-15 ENCOUNTER — Other Ambulatory Visit: Payer: Self-pay | Admitting: Hematology

## 2019-10-15 ENCOUNTER — Other Ambulatory Visit: Payer: Medicare Other

## 2019-10-15 ENCOUNTER — Ambulatory Visit: Payer: Medicare Other | Admitting: Hematology

## 2019-10-15 ENCOUNTER — Telehealth: Payer: Self-pay | Admitting: Hematology

## 2019-10-15 LAB — MULTIPLE MYELOMA PANEL, SERUM
Albumin SerPl Elph-Mcnc: 4 g/dL (ref 2.9–4.4)
Albumin/Glob SerPl: 2.3 — ABNORMAL HIGH (ref 0.7–1.7)
Alpha 1: 0.3 g/dL (ref 0.0–0.4)
Alpha2 Glob SerPl Elph-Mcnc: 0.5 g/dL (ref 0.4–1.0)
B-Globulin SerPl Elph-Mcnc: 0.8 g/dL (ref 0.7–1.3)
Gamma Glob SerPl Elph-Mcnc: 0.3 g/dL — ABNORMAL LOW (ref 0.4–1.8)
Globulin, Total: 1.8 g/dL — ABNORMAL LOW (ref 2.2–3.9)
IgA: 53 mg/dL — ABNORMAL LOW (ref 61–437)
IgG (Immunoglobin G), Serum: 366 mg/dL — ABNORMAL LOW (ref 603–1613)
IgM (Immunoglobulin M), Srm: 47 mg/dL (ref 15–143)
Total Protein ELP: 5.8 g/dL — ABNORMAL LOW (ref 6.0–8.5)

## 2019-10-15 MED ORDER — DULOXETINE HCL 60 MG PO CPEP: 60.0000 mg | ORAL_CAPSULE | Freq: Every day | ORAL | 2 refills | Status: DC

## 2019-10-15 NOTE — Telephone Encounter (Signed)
Scheduled per 01/28 los, spoke with patient's relative and he will be notified.

## 2019-10-16 ENCOUNTER — Other Ambulatory Visit: Payer: Self-pay

## 2019-10-16 ENCOUNTER — Ambulatory Visit (INDEPENDENT_AMBULATORY_CARE_PROVIDER_SITE_OTHER): Payer: Medicare Other | Admitting: Internal Medicine

## 2019-10-16 ENCOUNTER — Encounter: Payer: Self-pay | Admitting: Internal Medicine

## 2019-10-16 DIAGNOSIS — C9000 Multiple myeloma not having achieved remission: Secondary | ICD-10-CM | POA: Diagnosis not present

## 2019-10-16 DIAGNOSIS — J189 Pneumonia, unspecified organism: Secondary | ICD-10-CM

## 2019-10-16 DIAGNOSIS — I130 Hypertensive heart and chronic kidney disease with heart failure and stage 1 through stage 4 chronic kidney disease, or unspecified chronic kidney disease: Secondary | ICD-10-CM | POA: Diagnosis not present

## 2019-10-16 DIAGNOSIS — F418 Other specified anxiety disorders: Secondary | ICD-10-CM | POA: Diagnosis not present

## 2019-10-16 DIAGNOSIS — K559 Vascular disorder of intestine, unspecified: Secondary | ICD-10-CM | POA: Diagnosis not present

## 2019-10-16 DIAGNOSIS — C7951 Secondary malignant neoplasm of bone: Secondary | ICD-10-CM | POA: Diagnosis not present

## 2019-10-16 DIAGNOSIS — G620 Drug-induced polyneuropathy: Secondary | ICD-10-CM | POA: Diagnosis not present

## 2019-10-16 DIAGNOSIS — T451X5D Adverse effect of antineoplastic and immunosuppressive drugs, subsequent encounter: Secondary | ICD-10-CM | POA: Diagnosis not present

## 2019-10-16 DIAGNOSIS — Z7189 Other specified counseling: Secondary | ICD-10-CM

## 2019-10-16 MED ORDER — LORAZEPAM 0.5 MG PO TABS: 0.5000 mg | ORAL_TABLET | Freq: Three times a day (TID) | ORAL | 2 refills | Status: DC | PRN

## 2019-10-16 MED ORDER — DEXAMETHASONE 4 MG PO TABS: 12.0000 mg | ORAL_TABLET | ORAL | 1 refills | Status: DC

## 2019-10-16 MED ORDER — AMIODARONE HCL 200 MG PO TABS: 200.0000 mg | ORAL_TABLET | Freq: Every day | ORAL | 1 refills | Status: DC

## 2019-10-16 NOTE — Assessment & Plan Note (Signed)
Recovering  

## 2019-10-16 NOTE — Progress Notes (Signed)
Subjective:  Patient ID: [name] [name], male    DOB: 1941/08/30  Age: 79 y.o. MRN: [id number]  CC: No chief complaint on file.   HPI [name] [name] presents for a post- hosp stay - f/u pneumonia, MM, ARF, weakness  Per hx: "Admit date: 09/30/2019 Discharge date: 10/02/2019  Time spent: 35 minutes  Recommendations for Outpatient Follow-up:  1. Repeat basic metabolic panel to follow across renal function 2. 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  Patient discharged home with instruction to follow-up with PCP in 10 days.  Diet recommendation: Heart healthy diet.     Filed Weights   10/02/19 0058  Weight: 73 kg    History of present illness:  As per H&P written by Dr. Roosevelt Locks on 09/30/19 78 y.o.malewith medical history significant ofA. Fibon eliquis, multiplemyeloma,ascending aortic aneurysm, hypertension, hyperlipidemia, moderate aortic stenosis,chronic diastolic heart failure status post TAVR's in August 2020presented withpolyuria and generalized weaknessfor 2 days.Patient was recently hospitalized for sepsis from colitis, and developedafibwith RVRduring admission and cardioverted. Sincedischarge,hislegs are weak buthe has been able towalk with minimal assistance with cane.Yesterday not stable on his feet needed maximum assistance. Also constipatedbut did havehad normal bowel movementyesterday. Decreased PO intake. No oxygen at home. Checking temperature and no fevers at home.He has had dysuria and urinary frequency x 2 days.  ED Course:Patient was found to have hypoxia 88% on room air,chest x-ray showed  questionable nodules on the right lung, CT angiogram negative for PE but showing right lower lobe infiltrates.  Hospital Course:  1-acute hypoxic respiratory failure secondary to right-sided pneumonia -Right-sided pneumonia appreciated on CT -Patient currently afebrile, after initiation of IV antibiotics and supportive care no longer requiring supplementation with a stable O2 sats on room air -No nausea, no vomiting -Patient will be treated with Augmentin to complete antibiotic therapy. -Advised to maintain adequate hydration. -Speech therapy has seen patient and no changes on his diet consistency recommended at this time. -General precaution while eating and after eating discussed with patient and wife at bedside.  2-multiple myeloma/severe peripheral neuropathy secondary to cancer chemotherapy -Patient with history of multiple myeloma -Currently on Revlimid and Velcade -Patient actively followed by Dr. Posey Pronto in the outpatient basis -Continue treatment and close follow-up by oncologist.  3-paroxysmal atrial fibrillation -Continue Amiodarone and beta-blocker -Continue Eliquis  4-acute kidney injury -Resolved after holding nephrotoxic agents and providing fluid resuscitation -No hydronephrosis appreciated on renal ultrasound -Electrolytes and renal function back to normal at time of discharge -Patient advised to keep himself well-hydrated. -Patient denies dysuria.  5-essential hypertension -Stable continue current antihypertensive regimen -Advised to follow heart healthy diet.  6-depression/anxiety -No hallucinations -A stable mood. -Continue Cymbalta and as needed Ativan.  7-gastroesophageal reflux disease -Continue Pepcid  8-physical deconditioning -Discharged home with home health services."    Outpatient Medications Prior to Visit  Medication Sig Dispense Refill  . acyclovir (ZOVIRAX) 400 MG tablet Take 1 tablet (400 mg total) by mouth 2 (two) times daily.  180 tablet 3  . Amino Acids-Protein Hydrolys (PRO-STAT 101) LIQD Take 30 mLs by mouth daily. 887 mL 5  . amiodarone (PACERONE) 200 MG tablet Take 1 tablet (200 mg total) by mouth daily. 30 tablet 1  .  amLODipine (NORVASC) 5 MG tablet Take 1 tablet (5 mg total) by mouth daily. 90 tablet 3  . b complex vitamins tablet Take 1 tablet by mouth daily. 100 tablet 3  . Calcium-Magnesium 500-250 MG TABS Take 1 tablet by mouth daily.    . Carboxymethylcellul-Glycerin (LUBRICATING EYE DROPS OP) Place 1 drop into both eyes daily as needed (dry eyes).    . Cholecalciferol (VITAMIN D) 50 MCG (2000 UT) tablet Take 2,000 Units by mouth daily.    [name] dexamethasone (DECADRON) 4 MG tablet Take 3 tablets (12 mg total) by mouth once a week. (Patient taking differently: Take 12 mg by mouth every Monday. ) 20 tablet 6  . diclofenac Sodium (VOLTAREN) 1 % GEL Apply 1 application topically 4 (four) times daily as needed (pain).    . DULoxetine (CYMBALTA) 60 MG capsule Take 1 capsule (60 mg total) by mouth daily. 30 capsule 2  . ELIQUIS 5 MG TABS tablet TAKE 1 TABLET BY MOUTH TWICE A DAY (Patient taking differently: Take 5 mg by mouth 2 (two) times daily. ) 60 tablet 6  . ergocalciferol (VITAMIN D2) 1.25 MG (50000 UT) capsule Take 1 capsule (50,000 Units total) by mouth once a week. 12 capsule 3  . famotidine (PEPCID) 40 MG tablet Take 1 tablet (40 mg total) by mouth daily. 30 tablet 11  . fentaNYL (DURAGESIC) 12 MCG/HR Place 1 patch onto the skin every 3 (three) days. 10 patch 0  . furosemide (LASIX) 20 MG tablet Take 1 tablet (20 mg total) by mouth daily. (Patient taking differently: Take 20 mg by mouth daily. Taking every other day) 30 tablet 3  . gabapentin (NEURONTIN) 300 MG capsule Take 1 capsule (300 mg total) by mouth 3 (three) times daily.    [name] loratadine (CLARITIN) 10 MG tablet Take 1 tablet (10 mg total) by mouth daily. 30 tablet 11  . LORazepam (ATIVAN) 0.5 MG tablet Take 1 tablet (0.5 mg total) by mouth every 6  (six) hours as needed (Nausea or vomiting). 30 tablet 0  . metoprolol succinate (TOPROL-XL) 25 MG 24 hr tablet Take 1 tablet (25 mg total) by mouth 2 (two) times daily. 60 tablet 6  . ondansetron (ZOFRAN) 8 MG tablet Take 1 tablet (8 mg total) by mouth 2 (two) times daily as needed (Nausea or vomiting). 30 tablet 1  . oxyCODONE (OXY IR/ROXICODONE) 5 MG immediate release tablet Take 1-2 tablets (5-10 mg total) by mouth every 4 (four) hours as needed for moderate pain or severe pain. 120 tablet 0  . polyethylene glycol powder (GLYCOLAX/MIRALAX) 17 GM/SCOOP powder Take 17-34 g by mouth 2 (two) times daily as needed for moderate constipation. 500 g 5  . potassium chloride SA (KLOR-CON M20) 20 MEQ tablet Take 2 tablets (40 mEq total) by mouth 2 (two) times daily. 120 tablet 1  . prochlorperazine (COMPAZINE) 10 MG tablet Take 1 tablet (10 mg total) by mouth every 6 (six) hours as needed (Nausea or vomiting). 30 tablet 1  . senna-docusate (SENNA S) 8.6-50 MG tablet Take 2 tablets by mouth at bedtime. (Patient taking differently: Take 2 tablets by mouth at bedtime as needed for mild constipation. ) 60 tablet 2  . spironolactone (ALDACTONE) 25 MG tablet Take 1 tablet (25 mg total) by mouth daily. 30 tablet 3   No facility-administered medications prior to visit.    ROS: Review of Systems  Constitutional: Positive for fatigue. Negative for appetite change, chills, fever and unexpected weight change.  HENT: Negative for congestion,  nosebleeds, sneezing, sore throat and trouble swallowing.   Eyes: Negative for itching and visual disturbance.  Respiratory: Negative for cough and shortness of breath.   Cardiovascular: Negative for chest pain, palpitations and leg swelling.  Gastrointestinal: Negative for abdominal distention, blood in stool, diarrhea and nausea.  Genitourinary: Negative for frequency and hematuria.  Musculoskeletal: Positive for arthralgias, back pain and gait problem. Negative for joint  swelling and neck pain.  Skin: Negative for rash.  Neurological: Positive for weakness. Negative for dizziness, tremors and speech difficulty.  Psychiatric/Behavioral: Positive for dysphoric mood. Negative for agitation and sleep disturbance. The patient is nervous/anxious.     Objective:  BP 126/74 (BP Location: Left Arm, Patient Position: Sitting, Cuff Size: Normal)   Pulse 63   Temp 98 F (36.7 C) (Oral)   Ht '5\' 11"'$  (1.803 m)   Wt 156 lb (70.8 kg)   SpO2 97%   BMI 21.76 kg/m   BP Readings from Last 3 Encounters:  10/16/19 126/74  10/11/19 108/65  10/02/19 115/63    Wt Readings from Last 3 Encounters:  10/16/19 156 lb (70.8 kg)  10/11/19 157 lb 11.2 oz (71.5 kg)  10/02/19 160 lb 15 oz (73 kg)    Physical Exam Constitutional:      General: He is not in acute distress.    Appearance: He is well-developed. He is ill-appearing.     Comments: NAD  Eyes:     Conjunctiva/sclera: Conjunctivae normal.     Pupils: Pupils are equal, round, and reactive to light.  Neck:     Thyroid: No thyromegaly.     Vascular: No JVD.  Cardiovascular:     Rate and Rhythm: Normal rate and regular rhythm.     Heart sounds: Normal heart sounds. No murmur. No friction rub. No gallop.   Pulmonary:     Effort: Pulmonary effort is normal. No respiratory distress.     Breath sounds: Normal breath sounds. No wheezing or rales.  Chest:     Chest wall: No tenderness.  Abdominal:     General: Bowel sounds are normal. There is no distension.     Palpations: Abdomen is soft. There is no mass.     Tenderness: There is no abdominal tenderness. There is no guarding or rebound.  Musculoskeletal:        General: Tenderness present. Normal range of motion.     Cervical back: Normal range of motion.  Lymphadenopathy:     Cervical: No cervical adenopathy.  Skin:    General: Skin is warm and dry.     Findings: No rash.  Neurological:     Mental Status: He is alert and oriented to person, place, and  time.     Cranial Nerves: No cranial nerve deficit.     Motor: No abnormal muscle tone.     Coordination: Coordination normal.     Gait: Gait normal.     Deep Tendon Reflexes: Reflexes are normal and symmetric.  Psychiatric:        Behavior: Behavior normal.        Thought Content: Thought content normal.        Judgment: Judgment normal.    Looks better, stronger   Lab Results  Component Value Date   WBC 12.3 (H) 10/11/2019   HGB 11.8 (L) 10/11/2019   HCT 36.6 (L) 10/11/2019   PLT 187 10/11/2019   GLUCOSE 97 10/11/2019   CHOL 144 08/01/2018   TRIG 167.0 (H) 08/01/2018   HDL 28.20 (L)  08/01/2018   LDLDIRECT 151.5 11/20/2007   LDLCALC 82 08/01/2018   ALT 60 (H) 10/11/2019   AST 30 10/11/2019   NA 139 10/11/2019   K 4.8 10/11/2019   CL 104 10/11/2019   CREATININE 1.42 (H) 10/11/2019   BUN 30 (H) 10/11/2019   CO2 26 10/11/2019   TSH 4.76 (H) 10/03/2018   PSA 4.20 (H) 08/01/2018   INR 1.3 (H) 09/19/2019   HGBA1C 5.8 (H) 05/04/2019    CT Angio Chest PE W/Cm &/Or Wo Cm  Result Date: 09/30/2019 CLINICAL DATA:  Hypoxia, shortness of breath EXAM: CT ANGIOGRAPHY CHEST WITH CONTRAST TECHNIQUE: Multidetector CT imaging of the chest was performed using the standard protocol during bolus administration of intravenous contrast. Multiplanar CT image reconstructions and MIPs were obtained to evaluate the vascular anatomy. CONTRAST:  24m OMNIPAQUE IOHEXOL 350 MG/ML SOLN COMPARISON:  04/06/2019 FINDINGS: Cardiovascular: Aortic Root: --Valve: 3.4 cm --Sinuses: 3.4 cm --Sinotubular Junction: 3.4 cm Limitations by motion: Mild Thoracic Aorta: --Ascending Aorta: 4.5 cm (stable since 03/14/2019) --Aortic Arch: 3.2 cm (proximal) --Descending Aorta: 3.3 cm Other: Heart size upper limits normal. Small pericardial effusion. Satisfactory opacification of pulmonary arteries noted, and there is no evidence of pulmonary emboli. Scattered coronary calcifications. Interval TAVR. Mediastinum/Nodes: No hilar  or mediastinal adenopathy. Fluid distends the esophagus. Lungs/Pleura: No pleural effusion. No pneumothorax. Patchy airspace opacities in the posterior segment right upper lobe, throughout the right lower lobe, and to a minimal degree in the posterior left lung base, new since previous. Upper Abdomen: No acute findings. Musculoskeletal: Anterior vertebral endplate spurring at multiple levels in the mid and lower thoracic spine. T9 and T12 compression deformities. Review of the MIP images confirms the above findings. IMPRESSION: 1. Negative for acute PE or thoracic aortic dissection. 2. New patchy airspace opacities predominantly in the right lower lobe, also seen in the posterior upper lobe and to a minimal degree in the posterior left lung base, likely infectious/inflammatory. 3. Fluid distends the esophagus suggesting reflux disease or dysmotility. 4. Stable 4.5 cm ascending thoracic aortic aneurysm. Recommend semi-annual imaging followup by CTA or MRA and referral to cardiothoracic surgery if not already obtained. This recommendation follows 2010 ACCF/AHA/AATS/ACR/ASA/SCA/SCAI/SIR/STS/SVM Guidelines for the Diagnosis and Management of Patients With Thoracic Aortic Disease. Circulation. 2010; 121:: B284-X324Electronically Signed   By: DLucrezia EuropeM.D.   On: 09/30/2019 14:46   [place]  Result Date: 10/01/2019 CLINICAL DATA:  Acute renal injury EXAM: RENAL / URINARY TRACT ULTRASOUND COMPLETE COMPARISON:  None. FINDINGS: Right Kidney: Renal measurements: 11.3 x 5.2 x 5.2 cm = volume: 159 mL. No hydronephrosis is noted. A 1.2 cm upper pole cyst is seen. Left Kidney: Renal measurements: 11.0 x 5.5 x 5.1 cm = volume: 160 mL. No hydronephrosis is noted. 3.3 cm cyst is noted in the upper pole. Bladder: Well distended. Multiple bladder diverticula are noted. Prostate is mildly prominent. Other: None. IMPRESSION: Bilateral renal cysts.  No obstructive changes are noted. Multiple bladder diverticula. Electronically  Signed   By: MInez CatalinaM.D.   On: 10/01/2019 11:59   DG Chest Portable 1 View  Result Date: 09/30/2019 CLINICAL DATA:  79year old male with history of hypoxia. Polyuria. Generalized weakness. EXAM: PORTABLE CHEST 1 VIEW COMPARISON:  Chest x-ray 09/14/2019. FINDINGS: Elongated nodular density projecting over the right upper lobe, new compared to the prior study. No pleural effusions. No evidence of pulmonary edema. No pneumothorax. Heart size is normal. Upper mediastinal contours are within normal limits. Status post transcatheter aortic valve replacement.  IMPRESSION: 1. New elongated nodular density projecting over the region of the right upper lobe, presumably infectious or inflammatory in etiology given the rapid development over the past 2 weeks. This could be better evaluated with follow-up nonemergent noncontrast chest CT if clinically appropriate. At the very least, follow-up standing PA and lateral chest radiographs are recommended in the next 2-3 weeks to ensure the stability or resolution of this finding. 2. Aortic atherosclerosis. 3. Status post TAVR. Electronically Signed   By: Vinnie Langton M.D.   On: 09/30/2019 12:50    Assessment & Plan:   Follow-up: No follow-ups on file.  [name], MD

## 2019-10-16 NOTE — Assessment & Plan Note (Signed)
Doing better after d/c

## 2019-10-17 DIAGNOSIS — C9000 Multiple myeloma not having achieved remission: Secondary | ICD-10-CM | POA: Diagnosis not present

## 2019-10-17 DIAGNOSIS — G620 Drug-induced polyneuropathy: Secondary | ICD-10-CM | POA: Diagnosis not present

## 2019-10-17 DIAGNOSIS — T451X5D Adverse effect of antineoplastic and immunosuppressive drugs, subsequent encounter: Secondary | ICD-10-CM | POA: Diagnosis not present

## 2019-10-17 DIAGNOSIS — K559 Vascular disorder of intestine, unspecified: Secondary | ICD-10-CM | POA: Diagnosis not present

## 2019-10-17 DIAGNOSIS — I130 Hypertensive heart and chronic kidney disease with heart failure and stage 1 through stage 4 chronic kidney disease, or unspecified chronic kidney disease: Secondary | ICD-10-CM | POA: Diagnosis not present

## 2019-10-17 DIAGNOSIS — C7951 Secondary malignant neoplasm of bone: Secondary | ICD-10-CM | POA: Diagnosis not present

## 2019-10-18 ENCOUNTER — Telehealth: Payer: Self-pay | Admitting: *Deleted

## 2019-10-18 NOTE — Telephone Encounter (Addendum)
Contacted patient's wife (unable to reach patient at home number) regarding test results per Dr. Grier Mitts directions. She verbalized understanding and states they look forward to speaking with MD on 2/11.   ----- Message from Brunetta Genera, MD sent at 10/17/2019 11:20 PM EST ----- Please let patient know his myeloma panel shows resolution of M protein suggesting continued complete response of his myeloma. Will monitor and hold off any additional rx at this time for myeloma and focus on therapies.

## 2019-10-19 ENCOUNTER — Other Ambulatory Visit: Payer: Self-pay | Admitting: *Deleted

## 2019-10-19 ENCOUNTER — Telehealth: Payer: Self-pay

## 2019-10-19 DIAGNOSIS — I130 Hypertensive heart and chronic kidney disease with heart failure and stage 1 through stage 4 chronic kidney disease, or unspecified chronic kidney disease: Secondary | ICD-10-CM | POA: Diagnosis not present

## 2019-10-19 DIAGNOSIS — C9000 Multiple myeloma not having achieved remission: Secondary | ICD-10-CM | POA: Diagnosis not present

## 2019-10-19 DIAGNOSIS — C7951 Secondary malignant neoplasm of bone: Secondary | ICD-10-CM | POA: Diagnosis not present

## 2019-10-19 DIAGNOSIS — G620 Drug-induced polyneuropathy: Secondary | ICD-10-CM | POA: Diagnosis not present

## 2019-10-19 DIAGNOSIS — T451X5D Adverse effect of antineoplastic and immunosuppressive drugs, subsequent encounter: Secondary | ICD-10-CM | POA: Diagnosis not present

## 2019-10-19 DIAGNOSIS — K559 Vascular disorder of intestine, unspecified: Secondary | ICD-10-CM | POA: Diagnosis not present

## 2019-10-19 NOTE — Telephone Encounter (Signed)
Galina with [facility] calling and states that the patient is more disoriented and confused than the last time she had seen him. States that his vitals were okay. BP 120/70 HR 57 t 97.7. States that while she was there he had lost his balance a few times. Please advise. CB#: [phone]

## 2019-10-19 NOTE — Patient Outreach (Signed)
Telephone outreach.  Spoke with Mrs. Jeon today. She reports her husbands oncology results were good. He seems to be in remission right now and no need of any treatments at this time. She reports he remains very weak and is still getting therapy at home. Today he seemed to struggle mentally. The PT alerted the primary care provider.  Listened to and supported Mrs. Nold.  She was able to get the Pro-stat from her home pharmacy with the help of the pharmacist there. She was very pleased with this as it had seemed to be unattainable.  Encouraged wife to call me if I can be of any assistance.  I will call again in 2 weeks.  Eulah Pont. Myrtie Neither, MSN, [facility] Gerontological Nurse Practitioner [facility] Care Management [phone]

## 2019-10-22 ENCOUNTER — Telehealth (HOSPITAL_COMMUNITY): Payer: Self-pay

## 2019-10-22 DIAGNOSIS — I5032 Chronic diastolic (congestive) heart failure: Secondary | ICD-10-CM | POA: Diagnosis not present

## 2019-10-22 DIAGNOSIS — K559 Vascular disorder of intestine, unspecified: Secondary | ICD-10-CM | POA: Diagnosis not present

## 2019-10-22 DIAGNOSIS — C9000 Multiple myeloma not having achieved remission: Secondary | ICD-10-CM | POA: Diagnosis not present

## 2019-10-22 DIAGNOSIS — I480 Paroxysmal atrial fibrillation: Secondary | ICD-10-CM | POA: Diagnosis not present

## 2019-10-22 DIAGNOSIS — I130 Hypertensive heart and chronic kidney disease with heart failure and stage 1 through stage 4 chronic kidney disease, or unspecified chronic kidney disease: Secondary | ICD-10-CM | POA: Diagnosis not present

## 2019-10-22 DIAGNOSIS — Z7901 Long term (current) use of anticoagulants: Secondary | ICD-10-CM | POA: Diagnosis not present

## 2019-10-22 DIAGNOSIS — C7951 Secondary malignant neoplasm of bone: Secondary | ICD-10-CM | POA: Diagnosis not present

## 2019-10-22 DIAGNOSIS — E785 Hyperlipidemia, unspecified: Secondary | ICD-10-CM | POA: Diagnosis not present

## 2019-10-22 DIAGNOSIS — N189 Chronic kidney disease, unspecified: Secondary | ICD-10-CM | POA: Diagnosis not present

## 2019-10-22 DIAGNOSIS — T82857D Stenosis of cardiac prosthetic devices, implants and grafts, subsequent encounter: Secondary | ICD-10-CM | POA: Diagnosis not present

## 2019-10-22 DIAGNOSIS — E876 Hypokalemia: Secondary | ICD-10-CM | POA: Diagnosis not present

## 2019-10-22 DIAGNOSIS — J189 Pneumonia, unspecified organism: Secondary | ICD-10-CM | POA: Diagnosis not present

## 2019-10-22 DIAGNOSIS — I714 Abdominal aortic aneurysm, without rupture: Secondary | ICD-10-CM | POA: Diagnosis not present

## 2019-10-22 DIAGNOSIS — Z9181 History of falling: Secondary | ICD-10-CM | POA: Diagnosis not present

## 2019-10-22 DIAGNOSIS — G622 Polyneuropathy due to other toxic agents: Secondary | ICD-10-CM | POA: Diagnosis not present

## 2019-10-22 DIAGNOSIS — T451X5D Adverse effect of antineoplastic and immunosuppressive drugs, subsequent encounter: Secondary | ICD-10-CM | POA: Diagnosis not present

## 2019-10-22 DIAGNOSIS — N400 Enlarged prostate without lower urinary tract symptoms: Secondary | ICD-10-CM | POA: Diagnosis not present

## 2019-10-22 DIAGNOSIS — M1990 Unspecified osteoarthritis, unspecified site: Secondary | ICD-10-CM | POA: Diagnosis not present

## 2019-10-22 DIAGNOSIS — G620 Drug-induced polyneuropathy: Secondary | ICD-10-CM | POA: Diagnosis not present

## 2019-10-22 NOTE — Telephone Encounter (Signed)

## 2019-10-23 ENCOUNTER — Ambulatory Visit (HOSPITAL_COMMUNITY)
Admission: RE | Admit: 2019-10-23 | Discharge: 2019-10-23 | Disposition: A | Discharge status: Home / Self Care | Payer: Medicare Other | Source: Ambulatory Visit | Attending: Cardiology | Admitting: Cardiology

## 2019-10-23 ENCOUNTER — Encounter (HOSPITAL_COMMUNITY): Payer: Self-pay | Admitting: Cardiology

## 2019-10-23 ENCOUNTER — Other Ambulatory Visit: Payer: Self-pay

## 2019-10-23 VITALS — BP 110/70 | HR 57 | Wt 153.8 lb

## 2019-10-23 DIAGNOSIS — G629 Polyneuropathy, unspecified: Secondary | ICD-10-CM | POA: Insufficient documentation

## 2019-10-23 DIAGNOSIS — Z952 Presence of prosthetic heart valve: Secondary | ICD-10-CM | POA: Diagnosis not present

## 2019-10-23 DIAGNOSIS — R0602 Shortness of breath: Secondary | ICD-10-CM | POA: Diagnosis not present

## 2019-10-23 DIAGNOSIS — R5383 Other fatigue: Secondary | ICD-10-CM | POA: Diagnosis not present

## 2019-10-23 DIAGNOSIS — R531 Weakness: Secondary | ICD-10-CM | POA: Insufficient documentation

## 2019-10-23 DIAGNOSIS — Z79899 Other long term (current) drug therapy: Secondary | ICD-10-CM | POA: Diagnosis not present

## 2019-10-23 DIAGNOSIS — I110 Hypertensive heart disease with heart failure: Secondary | ICD-10-CM | POA: Insufficient documentation

## 2019-10-23 DIAGNOSIS — Q231 Congenital insufficiency of aortic valve: Secondary | ICD-10-CM | POA: Insufficient documentation

## 2019-10-23 DIAGNOSIS — I2510 Atherosclerotic heart disease of native coronary artery without angina pectoris: Secondary | ICD-10-CM | POA: Diagnosis not present

## 2019-10-23 DIAGNOSIS — Z7952 Long term (current) use of systemic steroids: Secondary | ICD-10-CM | POA: Insufficient documentation

## 2019-10-23 DIAGNOSIS — I4510 Unspecified right bundle-branch block: Secondary | ICD-10-CM | POA: Diagnosis not present

## 2019-10-23 DIAGNOSIS — Z8249 Family history of ischemic heart disease and other diseases of the circulatory system: Secondary | ICD-10-CM | POA: Insufficient documentation

## 2019-10-23 DIAGNOSIS — I5032 Chronic diastolic (congestive) heart failure: Secondary | ICD-10-CM

## 2019-10-23 DIAGNOSIS — E7800 Pure hypercholesterolemia, unspecified: Secondary | ICD-10-CM | POA: Insufficient documentation

## 2019-10-23 DIAGNOSIS — Z6821 Body mass index (BMI) 21.0-21.9, adult: Secondary | ICD-10-CM | POA: Insufficient documentation

## 2019-10-23 DIAGNOSIS — K559 Vascular disorder of intestine, unspecified: Secondary | ICD-10-CM | POA: Diagnosis not present

## 2019-10-23 DIAGNOSIS — Z7901 Long term (current) use of anticoagulants: Secondary | ICD-10-CM | POA: Insufficient documentation

## 2019-10-23 DIAGNOSIS — R630 Anorexia: Secondary | ICD-10-CM | POA: Insufficient documentation

## 2019-10-23 DIAGNOSIS — M1990 Unspecified osteoarthritis, unspecified site: Secondary | ICD-10-CM | POA: Insufficient documentation

## 2019-10-23 DIAGNOSIS — I5033 Acute on chronic diastolic (congestive) heart failure: Secondary | ICD-10-CM

## 2019-10-23 DIAGNOSIS — J189 Pneumonia, unspecified organism: Secondary | ICD-10-CM | POA: Diagnosis not present

## 2019-10-23 DIAGNOSIS — C7951 Secondary malignant neoplasm of bone: Secondary | ICD-10-CM | POA: Diagnosis not present

## 2019-10-23 DIAGNOSIS — G620 Drug-induced polyneuropathy: Secondary | ICD-10-CM | POA: Diagnosis not present

## 2019-10-23 DIAGNOSIS — I480 Paroxysmal atrial fibrillation: Secondary | ICD-10-CM | POA: Diagnosis not present

## 2019-10-23 DIAGNOSIS — Z791 Long term (current) use of non-steroidal anti-inflammatories (NSAID): Secondary | ICD-10-CM | POA: Diagnosis not present

## 2019-10-23 DIAGNOSIS — T451X5D Adverse effect of antineoplastic and immunosuppressive drugs, subsequent encounter: Secondary | ICD-10-CM | POA: Diagnosis not present

## 2019-10-23 DIAGNOSIS — C9000 Multiple myeloma not having achieved remission: Secondary | ICD-10-CM | POA: Insufficient documentation

## 2019-10-23 LAB — COMPREHENSIVE METABOLIC PANEL
ALT: 82 U/L — ABNORMAL HIGH (ref 0–44)
AST: 37 U/L (ref 15–41)
Albumin: 4 g/dL (ref 3.5–5.0)
Alkaline Phosphatase: 56 U/L (ref 38–126)
Anion gap: 11 (ref 5–15)
BUN: 24 mg/dL — ABNORMAL HIGH (ref 8–23)
CO2: 26 mmol/L (ref 22–32)
Calcium: 9.5 mg/dL (ref 8.9–10.3)
Chloride: 101 mmol/L (ref 98–111)
Creatinine, Ser: 1.38 mg/dL — ABNORMAL HIGH (ref 0.61–1.24)
GFR calc Af Amer: 56 mL/min — ABNORMAL LOW (ref >60)
GFR calc non Af Amer: 48 mL/min — ABNORMAL LOW (ref >60)
Glucose, Bld: 123 mg/dL — ABNORMAL HIGH (ref 70–99)
Potassium: 4.8 mmol/L (ref 3.5–5.1)
Sodium: 138 mmol/L (ref 135–145)
Total Bilirubin: 0.6 mg/dL (ref 0.3–1.2)
Total Protein: 5.9 g/dL — ABNORMAL LOW (ref 6.5–8.1)

## 2019-10-23 LAB — TSH: TSH: 12.342 u[IU]/mL — ABNORMAL HIGH (ref 0.350–4.500)

## 2019-10-23 NOTE — Patient Instructions (Addendum)
STOP Potassium   Labs today  We will only contact you if something comes back abnormal or we need to make some changes. Otherwise no news is good news!   Your physician recommends that you schedule a follow-up appointment in: 3 months with Dr Aundra Dubin.  NEXT APPOINTMENT: Tuesday, May 11th, 2021 at 10:00am GARAGE CODE 5008  Please call office at [phone] option 2 if you have any questions or concerns.    At the [facility], you and your health needs are our priority. As part of our continuing mission to provide you with exceptional heart care, we have created designated Provider Care Teams. These Care Teams include your primary Cardiologist (physician) and Advanced Practice Providers (APPs- Physician Assistants and Nurse Practitioners) who all work together to provide you with the care you need, when you need it.   You may see any of the following providers on your designated Care Team at your next follow up: [name] Dr Glori Bickers . Dr [name] . Darrick Grinder, NP . Lyda Jester, PA . Audry Riles, PharmD   Please be sure to bring in all your medications bottles to every appointment.

## 2019-10-23 NOTE — Progress Notes (Signed)
Date:  10/23/2019  ID:  Starr Engel, DOB [birth year], MRN [id number]  Provider location: [place] [facility] Type of Visit: Established patient   PCP:  Cassandria Anger, MD  Cardiologist: Dr. Aundra Dubin  Chief Complaint: Shortness of breath   History of Present Illness: [name] [name] is a 79 y.o. male who has history of HTN, aortic stenosis and paroxysmal atrial fibrillation. He was hospitalized with atrial fibrillation/RVR in 5/14.  He had TEE-guided cardioversion.  TEE showed bicuspid aortic valve with mild AS and a moderately dilated ascending aorta.  Most echo in 4/17 showed moderate aortic stenosis and MRA chest in 11/17 showed 4.1 cm ascending aorta.   He was on Xarelto for anticoagulation but stopped it as he was convinced it was causing joint pains.  He then refused to start any other anticoagulation at that time.    In 5/17, he had been back in atrial fibrillation for several days and was symptomatic.  I started him on Eliquis and planned TEE-guided DCCV given significant symptoms, but he converted back to NSR on his own.  He continued Eliquis for about 1 month then stopped it on his own.    In 11/17, he was hospitalized with symptomatic atrial fibrillation with RVR.  I started him on diltiazem CD and Eliquis with plan for TEE-guided DCCV.  However, he converted back to NSR on his own. He stopped the diltiazem but has continued the Eliquis.   He was admitted in 2/18 with fever, LUL PNA and left-sided pleural effusion.  Thoracentesis on left was suggestive of parapneumonic effusion.  He was in the hospital 11 days.  During that time, he went into atrial fibrillation with RVR.  He was started on amiodarone and went back into NSR.  Amiodarone was subsequently stopped.   Recurrent atrial fibrillation with RVR in 2/19, felt more fatigued.  He underwent TEE-guided DCCV back to NSR.   He had a lower GI bleed in 11/19 post-polypectomy.  He has since restarted on  Eliquis.   Coronary CTA was done in 2/20, this showed mild nonobstructive CAD.  Echo in 2/20 showed EF 60-65%, bicuspid aortic valve with severe AS.   LHC in 6/20 showed no significant coronary disease.  In 8/20, he had TAVR with Cave 3 THV x 2 (valve in valve due to peri-valvular leak initially).  Post-procedure echoes have shown elevated gradient across the aortic valve though dimensionless index has only been in the mildly stenotic range.  Last echo in 9/20 showed mean aortic valve gradient 26 mmHg with dimensionless index 0.54.   Patient has additionally been diagnosed with multiple myeloma.  He was treated with Revlimid, Velcade, and prednisone.   He was admitted early in 1/21 with atrial fibrillation/RVR associated with ischemic colitis.  He underwent TEE-guided DCCV to NSR.   He was readmitted later in 1/21 with PNA.    Patient returns for followup of aortic stenosis and atrial fibrillation.  He is in NSR.  Neuropathic pain in his legs is better off Velcade.  He is also off Revlimid after ischemic colitis episode. He is very weak and fatigued.  He has lost 28 lbs since last appointment. Poor appetite.  He is off all multiple myeloma meds for the time being, but he seems to have responded well to the treatment so far.  He walks up to about 100 yards.  Not limited by dyspnea but gets fatigued. No chest pain. No lightheadedness.  No palpitations.  ECG (personally reviewed): NSR, 1st degree AVB, LAFB  Labs (5/14): K 3.7, creatinine 0.9, BNP 2261=>109, TSH normal Labs (7/14): K 3.5, creatinine 1.0 Labs (12/14): K 3.8, creatinine 1.0, LDL particle number 1374, LDL 107, TSH normal Labs (6/16): TSH normal, K 3.8, creatinine 0.84, HCT 42.4, LDL 85, LFTs normal Labs (3/17): K 3.8, creatinine 0.87 Labs (5/17): K 4.5, creatinine 0.97, HCT 43.7 Labs (11/17): K 3.2, creatinine 0.81, LDL 71, HDL 24 Labs (2/18): K 3.8, creatinine 0.89 Labs (2/19): K 3.7, creatinine 0.83, hgb 16 Labs  (11/19): LDL 82 Labs (1/20): TSH elevated but free T4 normal, K 4, creatinine 0.85, hgb 13.5 Labs (2/20): TSH mildly elevated but free T4 normal, K 4, creatinine 0.85 Labs (5/20): ESR 48 Labs (9/20): hgb 14.1, plts 38 => 82, K 3.3, creatinine 0.98 => 1.06 Labs (11/20): BNP 126.5 Labs (12/20): hgb 11.9, K 3.2, creatinine 0.87 Labs (1/21): K 4.8, creatinine 1.42, AST 30, ALT 60  Allergies (verified):  No Known Drug Allergies   Past Medical History:  1. Hypertension: ACEI cough.  2. Atrial fibrillation. The patient had new-onset atrial fibrillation in November 2009. He underwent ibutilide cardioversion successfully. He has had 1-2 episodes/year that are short-lived that likely are atrial fibrillation with RVR. He was admitted in 5/14 with atrial fibrillation/RVR and had TEE-guided DCCV.  Atrial fibrillation again in 5/17, converted back to NSR spontaneously.  CHADSVASC score 2.  - Atrial fibrillation 2/19 with TEE-guided DCCV.  - 1/21 TEE-guided DCCV 3. Hypercholesterolemia.  4. Bicuspid aortic valve disorder: Echo (7/11): EF 55-60%, mild LV hypertrophy, mild aortic stenosis with mean gradient 19 mmHg and peak gradient 36 mmHg.  TEE (5/14): EF 55%, mild LVH, bicuspid aortic valve with mild AS (mean gradient 13 mmHg), ascending aorta 4.5 cm.  Echo (9/15) with EF 65-70%, moderate AS (mean gradient 23 mmHg), mild AI, ascending aorta 4.1 cm, mild MR.   - Echo (4/17) with EF 65-70%, moderate aortic stenosis with mean gradient 27 mmHg, PASP 31 mmHg, ascending aorta 4.4 cm.  - Echo (2/18) with EF 55-60%, bicuspid aortic valve with moderate aortic stenosis (underestimated gradient).  - TEE (2/19): EF 60-65%, moderate LVH, bicuspid aortic valve with moderate AS with mean gradient 28 mmHg and AVA 1.2 cm^2, 4.4 cm ascending aorta.  - Echo (2/20): EF 60-65%, mild LVH, bicuspid aortic valve with severe AS (mean gradient 48 mmHg, AVA 0.8 cm^2).  - TAVR 8/20 with valve in valve Edwards Sapien 3 THVs (because  of peri-valvular leak after initial valve placed).  - Echo (9/20): EF > 65%, mild LVH, mild RV dilation with normal RV systolic function, mean aortic valve gradient 26 mmHg with dimensionless index 0.54, IVC normal.  - TEE (1/21): EF 60-65%, moderate LVH, normal RV, s/p valve-in-valve TAVR with mean gradient 8 mmHg and no PVL.  5. Osteoarthritis.  6. Low back pain.  7. Chest pain: ETT-myoview (12/11) with 10:51 exercise, no chest pain, no significant ST changes, EF 69%, no evidence for ischemia or infarction.  - Coronary CTA (2/20): calcium score 41, nonobstructive CAD.  - LHC (6/20): No significant CAD.  8. Ascending aortic aneurysm: Associated with bicuspid aortic valve.  4.5 cm by TEE in 5/14.   MRA chest (6/14) with bicuspid aortic valve, 4.3 cm ascending aortic aneurysm.  MRA chest (10/15) with 4.2 cm ascending aorta (bicuspid aortic valve noted).  Echo (4/17) with ascending aorta diameter 4.4 cm.  - MRA chest (11/17) with 4.1 cm ascending aorta.  - Echo (2/19): 4.4 cm ascending  aorta.  - Coronary CTA in 2/20 showed 4.3 cm ascending aorta.  9. Colonic polyps: GI bleeding in 11/19 s/p polypectomy.  10. Multiple myeloma.  11. Bradycardia 12. Thrombocytopenia: likely related to chemotherapy for multiple myeloma.  13. PVCs: Zio monitor in 10/20 showed 9.4% PVCs.  14. Chronic diastolic CHF: [facility] (56/43) with mean RA 3, PA 22/2, mean PCWP 6, CI 2.74   Current Outpatient Medications  Medication Sig Dispense Refill  . acyclovir (ZOVIRAX) 400 MG tablet Take 1 tablet (400 mg total) by mouth 2 (two) times daily. 180 tablet 3  . Amino Acids-Protein Hydrolys (PRO-STAT 101) LIQD Take 30 mLs by mouth daily. 887 mL 5  . amiodarone (PACERONE) 200 MG tablet Take 1 tablet (200 mg total) by mouth daily. 30 tablet 1  . amLODipine (NORVASC) 5 MG tablet Take 1 tablet (5 mg total) by mouth daily. 90 tablet 3  . apixaban (ELIQUIS) 5 MG TABS tablet Take 5 mg by mouth 2 (two) times daily.    [name] b complex  vitamins tablet Take 1 tablet by mouth daily. 100 tablet 3  . Calcium-Magnesium 500-250 MG TABS Take 1 tablet by mouth daily.    . Carboxymethylcellul-Glycerin (LUBRICATING EYE DROPS OP) Place 1 drop into both eyes daily as needed (dry eyes).    . Cholecalciferol (VITAMIN D) 50 MCG (2000 UT) tablet Take 2,000 Units by mouth daily.    [name] dexamethasone (DECADRON) 4 MG tablet Take 3 tablets (12 mg total) by mouth once a week. 22 tablet 1  . diclofenac Sodium (VOLTAREN) 1 % GEL Apply 1 application topically 4 (four) times daily as needed (pain).    . DULoxetine (CYMBALTA) 60 MG capsule Take 1 capsule (60 mg total) by mouth daily. 30 capsule 2  . ergocalciferol (VITAMIN D2) 1.25 MG (50000 UT) capsule Take 50,000 Units by mouth once a week. Every Monday    . famotidine (PEPCID) 40 MG tablet Take 1 tablet (40 mg total) by mouth daily. 30 tablet 11  . fentaNYL (DURAGESIC) 12 MCG/HR Place 1 patch onto the skin every 3 (three) days. 10 patch 0  . furosemide (LASIX) 20 MG tablet Take 20 mg by mouth every other day.    . gabapentin (NEURONTIN) 300 MG capsule Take 1 capsule (300 mg total) by mouth 3 (three) times daily.    [name] loratadine (CLARITIN) 10 MG tablet Take 1 tablet (10 mg total) by mouth daily. 30 tablet 11  . LORazepam (ATIVAN) 0.5 MG tablet Take 1 tablet (0.5 mg total) by mouth every 8 (eight) hours as needed (Nausea or vomiting). 60 tablet 2  . metoprolol succinate (TOPROL-XL) 25 MG 24 hr tablet Take 1 tablet (25 mg total) by mouth 2 (two) times daily. 60 tablet 6  . ondansetron (ZOFRAN) 8 MG tablet Take 1 tablet (8 mg total) by mouth 2 (two) times daily as needed (Nausea or vomiting). 30 tablet 1  . oxyCODONE (OXY IR/ROXICODONE) 5 MG immediate release tablet Take 1-2 tablets (5-10 mg total) by mouth every 4 (four) hours as needed for moderate pain or severe pain. 120 tablet 0  . polyethylene glycol powder (GLYCOLAX/MIRALAX) 17 GM/SCOOP powder Take 17-34 g by mouth 2 (two) times daily as needed for  moderate constipation. 500 g 5  . prochlorperazine (COMPAZINE) 10 MG tablet Take 1 tablet (10 mg total) by mouth every 6 (six) hours as needed (Nausea or vomiting). 30 tablet 1  . senna-docusate (SENOKOT-S) 8.6-50 MG tablet Take 2 tablets by mouth at bedtime.    [name]  spironolactone (ALDACTONE) 25 MG tablet Take 1 tablet (25 mg total) by mouth daily. 30 tablet 3   No current facility-administered medications for this encounter.    Allergies:   Xarelto [rivaroxaban], Ambien [zolpidem], Corticosteroids, Ramipril, and Benazepril   Social History:  The patient  reports that he has never smoked. He has never used smokeless tobacco. He reports current alcohol use. He reports that he does not use drugs.   Family History:  The patient's family history includes Colon cancer (age of onset: 86) in his mother; Hypertension in an other family member.   ROS:  Please see the history of present illness.   All other systems are personally reviewed and negative.   Exam:   BP 110/70   Pulse (!) 57   Wt 69.8 kg (153 lb 12.8 oz)   SpO2 98%   BMI 21.45 kg/m  General: NAD, thin.  Neck: No JVD, no thyromegaly or thyroid nodule.  Lungs: Clear to auscultation bilaterally with normal respiratory effort. CV: Nondisplaced PMI.  Heart regular S1/S2, no S3/S4, 2/6 early SEM RUSB.  No peripheral edema.  No carotid bruit.  Normal pedal pulses.  Abdomen: Soft, nontender, no hepatosplenomegaly, no distention.  Skin: Intact without lesions or rashes.  Neurologic: Alert and oriented x 3.  Psych: Normal affect. Extremities: No clubbing or cyanosis.  HEENT: Normal.   Recent Labs: 07/30/2019: B Natriuretic Peptide 126.5 10/02/2019: Magnesium 2.5 10/11/2019: Hemoglobin 11.8; Platelets 187 10/23/2019: ALT 82; BUN 24; Creatinine, Ser 1.38; Potassium 4.8; Sodium 138; TSH 12.342  Personally reviewed   Wt Readings from Last 3 Encounters:  10/23/19 69.8 kg (153 lb 12.8 oz)  10/16/19 70.8 kg (156 lb)  10/11/19 71.5 kg (157 lb  11.2 oz)      ASSESSMENT AND PLAN:  1. Atrial fibrillation: Paroxysmal. He is quite symptomatic when in atrial fibrillation.  CHADSVASC = 3.  He is in NSR today.   - Continue Eliquis.  - He has not wanted atrial fibrillation ablation.   - Continue amiodarone.  Check CMET and TSH today (ALT has been mildly elevated). He will need a regular eye exam on amiodarone.   2. Peripheral neuropathy: Patient developed a very painful peripheral neuropathy on Velcade.  This is considerably improved since stopping Velcade.  3. Bradycardia: After TAVR, patient had RBBB and LAFB with HR down to the 40s.  RBBB has resolved and HR is higher.  4. PVCs: Zio monitor in 10/20 showed 9.4% PVCs.  Toprol XL was increased to 25 mg bid, he is not feeling palpitations.   5. HTN: BP controlled.     - Continue amlodipine 5 mg daily.     - Continue spironolactone 50 mg daily.  6. Bicuspid aortic valve disorder: Ascending aorta was 4.3 cm on recent coronary CTA. He is now s/p TAVR with 2 Hamilton (valve-in-valve due to peri-valvular leak after 1st valve placed). Post-op, mean gradient across the aortic valve was elevated, 26 mmHg in 9/20.  Dimensionless index, in 9/20 0.54, only suggests mild bioprosthetic aortic stenosis.  Echo in 1/21 showed mean gradient down to 10 mmHg and TEE showed mean gradient 8 mmHg with no peri-valvular leakage.  7. Chronic diastolic CHF: He is now on Lasix 20 qod.  He does not look volume overloaded.   - He can stop KCl now that Lasix has been cut back.  8. Multiple myeloma: He is off Revlimid, prednisone, and Velcade.  He appears to have had a good response.  Followup in 3 months.   Signed, [name], MD  10/23/2019  [place] [address] [facility] [place] [address] [phone] (office) [phone] (fax)

## 2019-10-24 ENCOUNTER — Ambulatory Visit (HOSPITAL_COMMUNITY)
Admission: RE | Admit: 2019-10-24 | Discharge: 2019-10-24 | Disposition: A | Discharge status: Home / Self Care | Payer: Medicare Other | Source: Ambulatory Visit | Attending: Cardiology | Admitting: Cardiology

## 2019-10-24 ENCOUNTER — Telehealth (HOSPITAL_COMMUNITY): Payer: Self-pay

## 2019-10-24 ENCOUNTER — Telehealth: Payer: Self-pay | Admitting: Hematology

## 2019-10-24 DIAGNOSIS — I5032 Chronic diastolic (congestive) heart failure: Secondary | ICD-10-CM

## 2019-10-24 DIAGNOSIS — T451X5D Adverse effect of antineoplastic and immunosuppressive drugs, subsequent encounter: Secondary | ICD-10-CM | POA: Diagnosis not present

## 2019-10-24 DIAGNOSIS — C7951 Secondary malignant neoplasm of bone: Secondary | ICD-10-CM | POA: Diagnosis not present

## 2019-10-24 DIAGNOSIS — J189 Pneumonia, unspecified organism: Secondary | ICD-10-CM | POA: Diagnosis not present

## 2019-10-24 DIAGNOSIS — K559 Vascular disorder of intestine, unspecified: Secondary | ICD-10-CM | POA: Diagnosis not present

## 2019-10-24 DIAGNOSIS — C9000 Multiple myeloma not having achieved remission: Secondary | ICD-10-CM | POA: Diagnosis not present

## 2019-10-24 DIAGNOSIS — G620 Drug-induced polyneuropathy: Secondary | ICD-10-CM | POA: Diagnosis not present

## 2019-10-24 LAB — TSH: TSH: 4.319 u[IU]/mL (ref 0.350–4.500)

## 2019-10-24 LAB — T4, FREE: Free T4: 0.97 ng/dL (ref 0.61–1.12)

## 2019-10-24 NOTE — Progress Notes (Signed)
Nutrition Assessment   Reason for Assessment:  Patient identified on Malnutrition Screening report for weight loss and poor appetite   ASSESSMENT:  79 year old male with multiple myeloma followed by Dr. Irene Limbo.  Past medical history of afib, HTN, CAD, CHF.  Noted recent hospital admission for ischemic colitis and pneumonia.  Patient has been off myeloma treatment for 3 months.   Called and spoke with wife Zora and introduced self and service at [facility].  Wife reports poor appetite, eating 1/2 or less of usual portions.  Breakfast is usually 1/2 bagel with cream cheese, warm whole milk.  Mid morning will have whole milk yogurt.  Lunch is 2 eggs and sometimes bacon and 1/2 tangerine.  Dinner is 1/4 of meat (steak, fish does not like chicken unless in soup) with leafy green vegetable.  Giving prostat daily.  Unable to drink oral nutrition supplements due to sweetness.  Wife reports patient has been more constipated recently giving bowel regimen.    Medications: reviewed   Labs: reviewed   Anthropometrics:   Height: 71 inches Weight: 153 lb 12.8 oz on 2/9 Noted 190 lb 07/30/2019 BMI: 21  14% weight loss in the last 3 months, significant   NUTRITION DIAGNOSIS: Inadequate oral intake related to recent hospital admission (ichemic colitis, pneumonia) as evidenced by 14% weight loss in 3 months and poor appeitte   INTERVENTION:  Discussed strategies with wife to increase calories and protein.  Encouraged offering snacks/mini meals q 1-2 hours Encouraged fluids to help with constipation and continue bowel regimen Continue prostat at this time for added protein Provided contact information and wife will call RD with questions or concerns.   Next Visit:  No follow-up, wife to call RD if needed  Kayne Yuhas B. Zenia Resides, [place], [place] Registered Dietitian [phone] (pager)

## 2019-10-24 NOTE — Progress Notes (Signed)
HEMATOLOGY/ONCOLOGY CLINIC NOTE  Date of Service: 10/25/2019  Patient Care Team: Cassandria Anger, MD as PCP - General Erline Levine, MD as Attending Physician (Neurosurgery) Larey Dresser, MD (Cardiology) Jarome Matin, MD as Consulting Physician (Dermatology) Rigoberto Noel, MD as Consulting Physician (Pulmonary Disease) Alda Berthold, DO as Consulting Physician (Neurology) Deloria Lair, NP as [place] Management   CHIEF COMPLAINTS/PURPOSE OF CONSULTATION:   Plasma cell myeloma   HISTORY OF PRESENTING ILLNESS:  [name] [name] is a wonderful 79 y.o. male who has been referred to [place] by Angelena Form, PA for evaluation and management of lytic bone lesions. The pt reports that he is doing well overall.  The pt reports that he has occasional hip pain that radiates down his legs and prevents him from walking. He uses Advil, which helps his hip pain. He is used to exercising often, but has not been able to stay very physically active lately so he is gaining weight. The pt experiences some SOB when he wakes up in the morning.  The pt had a pre-procedural CTA C/A/P before a TAVR completed on 03/14/2019 which revealed "indeterminate osseus lesions in the bony pelvis," which led to an MRI and PET scan. He reports that he has pain when he pushes on his right chest.   He also notes that he had a fall while exercising in 06/2018 and thought he broke his back. He received a blood transfusion on 07/24/2018.   Of note prior to the patient's visit today, the pt has had a MRI pelvis w/wo contrast completed on 03/28/2019 with results revealing "1. Destructive bone lesions as detailed above. Findings most consistent with metastatic disease. PET-CT may be helpful for further evaluation and to establish a primary tumor. The right pelvic bone lesions should be amenable to image guided biopsy but a PET scan may demonstrate easier/safer biopsy sites. 2. No intrapelvic mass or  adenopathy. 3. Benign intraosseous lipoma involving the left anterior superior acetabulum."  The pt has also had PET whole body completed on 04/06/2019 with results revealing "1. Diffuse osseous metastatic disease as detailed above without findings for a primary neoplasm in the chest, abdomen or pelvis. The large destructive lesion involving the right ischium should be amenable to image guided biopsy. 2. Two small retroperitoneal lymph nodes and 1 small right obturator node showing hypermetabolism."   Most recent lab results (04/06/2019) of CBC is as follows: all values are WNL.  On review of systems, pt reports hip and leg pain, weight gain and denies syncope and denies belly pain, recent neuropathy and any other symptoms.   On PMHx the pt reports 5 cm hepatic flexure polyp removal, pneumonia, blood transfusion on 07/24/2018  On Social Hx the pt reports that he lives at home with his wife. He is from [place].    INTERVAL HISTORY:  I connected with [name] [name] on 10/25/19 at  2:20 PM EST by TELEHEALTH and verified that I am speaking with the correct person using two identifiers.   I discussed the limitations, risks, security and privacy concerns of performing an evaluation and management service by telemedicine and the availability of in-person appointments. I also discussed with the patient that there may be a patient responsible charge related to this service. The patient expressed understanding and agreed to proceed.   Other persons participating in the visit and their role in the encounter: [place]  Patient's location: Home  Provider's location: [facility]   Chief Complaint:  Plasma cell myeloma  [name] is a wonderful 79 y.o. male who has who is here today for evaluation and management of his Plasma Cell Myeloma. The patient's last visit with [place] was on 10/11/2019. The pt reports that he is doing well overall.  The pt reports doing better with PT at home and eating  better. Neuropathy symptoms better controlled.  Lab results today (10/23/19) of CBC w/diff and CMP is as follows: all values are WNL except for Glucose at 123, BUN at 24, Creatinine Ser at 1.38, Total Protein at 5.9, ALT at 82, GFR calc non Af Amer at 48, GFR calc Af Amer at 46, TSH at 12.342.  On review of systems, pt reports no other acute new symptoms.  MEDICAL HISTORY:  Past Medical History:  Diagnosis Date  . Ascending aortic aneurysm ([place])   . Bicuspid aortic valve   . Cancer ([place])   . CHF NYHA class I (no symptoms from ordinary activities), acute, diastolic ([place])   . Fatty liver    mild  . GI bleeding 07/21/2018   post polypectomy  . Hemorrhoids   . HTN (hypertension)   . Hypercholesteremia   . Hypokalemia   . Internal hemorrhoids   . LBP (low back pain)   . Moderate aortic stenosis   . Osteoarthritis   . Paroxysmal atrial fibrillation ([place])    a. new onset Afib in 07/2008. He underwent ibutilide cardioversion successfully. b. Recurrence 01/2013 s/p TEE/DCCV - was on Xarelto but he stopped it as he was convinced it was causing joint pn. c. Recurrence 01/2016 - spont conv to NSR. Pt took Eliquis x1 mo then declined further anticoag. d. Recurrence 07/2016.  [name] Pneumonia   . Tubular adenoma of colon     SURGICAL HISTORY: Past Surgical History:  Procedure Laterality Date  . BACK SURGERY  x12 years ago  . CARDIOVERSION N/A 01/26/2013   Procedure: CARDIOVERSION;  Surgeon: Larey Dresser, MD;  Location: [facility] ENDOSCOPY;  Service: Cardiovascular;  Laterality: N/A;  . CARDIOVERSION N/A 10/28/2017   Procedure: CARDIOVERSION;  Surgeon: Larey Dresser, MD;  Location: [facility] ENDOSCOPY;  Service: Cardiovascular;  Laterality: N/A;  . CARDIOVERSION N/A 03/03/2018   Procedure: CARDIOVERSION;  Surgeon: Lelon Perla, MD;  Location: [facility] ENDOSCOPY;  Service: Cardiovascular;  Laterality: N/A;  . CARDIOVERSION N/A 09/19/2019   Procedure: CARDIOVERSION;  Surgeon: Larey Dresser, MD;  Location: [facility]  ENDOSCOPY;  Service: Cardiovascular;  Laterality: N/A;  . COLONOSCOPY    . COLONOSCOPY  07/17/2018   at [facility]  . HEMORRHOID SURGERY    . LUMBAR LAMINECTOMY    . POLYPECTOMY    . RIGHT HEART CATH N/A 08/20/2019   Procedure: RIGHT HEART CATH;  Surgeon: Larey Dresser, MD;  Location: [place] CV LAB;  Service: Cardiovascular;  Laterality: N/A;  . RIGHT/LEFT HEART CATH AND CORONARY ANGIOGRAPHY N/A 03/07/2019   Procedure: RIGHT/LEFT HEART CATH AND CORONARY ANGIOGRAPHY;  Surgeon: Burnell Blanks, MD;  Location: [place] CV LAB;  Service: Cardiovascular;  Laterality: N/A;  . [name]  04/2019  . TEE WITHOUT CARDIOVERSION N/A 01/26/2013   Procedure: TRANSESOPHAGEAL ECHOCARDIOGRAM (TEE);  Surgeon: Larey Dresser, MD;  Location: [place];  Service: Cardiovascular;  Laterality: N/A;  . TEE WITHOUT CARDIOVERSION N/A 10/28/2017   Procedure: TRANSESOPHAGEAL ECHOCARDIOGRAM (TEE);  Surgeon: Larey Dresser, MD;  Location: [facility] ENDOSCOPY;  Service: Cardiovascular;  Laterality: N/A;  . TEE WITHOUT CARDIOVERSION N/A 05/08/2019   Procedure: TRANSESOPHAGEAL ECHOCARDIOGRAM (TEE);  Surgeon: Burnell Blanks,  MD;  Location: [place] CV LAB;  Service: Open Heart Surgery;  Laterality: N/A;  . TEE WITHOUT CARDIOVERSION N/A 09/19/2019   Procedure: TRANSESOPHAGEAL ECHOCARDIOGRAM (TEE);  Surgeon: Larey Dresser, MD;  Location: [facility] ENDOSCOPY;  Service: Cardiovascular;  Laterality: N/A;  . TRANSCATHETER AORTIC VALVE REPLACEMENT, TRANSFEMORAL N/A 05/08/2019   Procedure: TRANSCATHETER AORTIC VALVE REPLACEMENT, TRANSFEMORAL;  Surgeon: Burnell Blanks, MD;  Location: [place] CV LAB;  Service: Open Heart Surgery;  Laterality: N/A;    SOCIAL HISTORY: Social History   Socioeconomic History  . Marital status: Married    Spouse name: Not on file  . Number of children: 0  . Years of education: Not on file  . Highest education level: Not on file  Occupational History  . Occupation: Retired  Lobbyist: [employer]  Tobacco Use  . Smoking status: Never Smoker  . Smokeless tobacco: Never Used  Substance and Sexual Activity  . Alcohol use: Yes    Comment: Drinks 1 glass of wine nightly/socially  . Drug use: No  . Sexual activity: Yes  Other Topics Concern  . Not on file  Social History Narrative   Patient lives in [place] w/ his wife. He is a native of [place]. 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   Right-handed   Caffeine: 2 cups coffee per day   Two story home   Social Determinants of Health   Financial Resource Strain:   . Difficulty of Paying Living Expenses: Not on file  Food Insecurity:   . Worried About Charity fundraiser in the Last Year: Not on file  . Ran Out of Food in the Last Year: Not on file  Transportation Needs:   . Lack of Transportation (Medical): Not on file  . Lack of Transportation (Non-Medical): Not on file  Physical Activity:   . Days of Exercise per Week: Not on file  . Minutes of Exercise per Session: Not on file  Stress:   . Feeling of Stress : Not on file  Social Connections:   . Frequency of Communication with Friends and Family: Not on file  . Frequency of Social Gatherings with Friends and Family: Not on file  . Attends Religious Services: Not on file  . Active Member of Clubs or Organizations: Not on file  . Attends Archivist Meetings: Not on file  . Marital Status: Not on file  Intimate Partner Violence:   . Fear of Current or Ex-Partner: Not on file  . Emotionally Abused: Not on file  . Physically Abused: Not on file  . Sexually Abused: Not on file    FAMILY HISTORY: Family History  Problem Relation Age of Onset  . Colon cancer Mother 60  . Hypertension Other   . Coronary artery disease Neg Hx   . Colon polyps Neg Hx   . Esophageal cancer Neg Hx   . Rectal cancer Neg Hx   . Stomach cancer Neg Hx     ALLERGIES:  is allergic to xarelto  [rivaroxaban]; ambien [zolpidem]; corticosteroids; ramipril; and benazepril.  MEDICATIONS:  Current Outpatient Medications  Medication Sig Dispense Refill  . acyclovir (ZOVIRAX) 400 MG tablet Take 1 tablet (400 mg total) by mouth 2 (two) times daily. 180 tablet 3  . Amino Acids-Protein Hydrolys ([name]-STAT 101) LIQD Take 30 mLs by mouth daily. 887 mL 5  . amiodarone (PACERONE) 200 MG tablet Take 1 tablet (200 mg total)  by mouth daily. 30 tablet 1  . amLODipine (NORVASC) 5 MG tablet Take 1 tablet (5 mg total) by mouth daily. 90 tablet 3  . apixaban (ELIQUIS) 5 MG TABS tablet Take 5 mg by mouth 2 (two) times daily.    [name] b complex vitamins tablet Take 1 tablet by mouth daily. 100 tablet 3  . Calcium-Magnesium 500-250 MG TABS Take 1 tablet by mouth daily.    . Carboxymethylcellul-Glycerin (LUBRICATING EYE DROPS OP) Place 1 drop into both eyes daily as needed (dry eyes).    . Cholecalciferol (VITAMIN D) 50 MCG (2000 UT) tablet Take 2,000 Units by mouth daily.    [name] dexamethasone (DECADRON) 4 MG tablet Take 3 tablets (12 mg total) by mouth once a week. 22 tablet 1  . diclofenac Sodium (VOLTAREN) 1 % GEL Apply 1 application topically 4 (four) times daily as needed (pain).    . DULoxetine (CYMBALTA) 60 MG capsule Take 1 capsule (60 mg total) by mouth daily. 30 capsule 2  . ergocalciferol (VITAMIN D2) 1.25 MG (50000 UT) capsule Take 50,000 Units by mouth once a week. Every Monday    . famotidine (PEPCID) 40 MG tablet Take 1 tablet (40 mg total) by mouth daily. 30 tablet 11  . fentaNYL (DURAGESIC) 12 MCG/HR Place 1 patch onto the skin every 3 (three) days. 10 patch 0  . furosemide (LASIX) 20 MG tablet Take 20 mg by mouth every other day.    . gabapentin (NEURONTIN) 300 MG capsule Take 1 capsule (300 mg total) by mouth 3 (three) times daily.    [name] loratadine (CLARITIN) 10 MG tablet Take 1 tablet (10 mg total) by mouth daily. 30 tablet 11  . LORazepam (ATIVAN) 0.5 MG tablet Take 1 tablet (0.5 mg total) by  mouth every 8 (eight) hours as needed (Nausea or vomiting). 60 tablet 2  . metoprolol succinate (TOPROL-XL) 25 MG 24 hr tablet Take 1 tablet (25 mg total) by mouth 2 (two) times daily. 60 tablet 6  . ondansetron (ZOFRAN) 8 MG tablet Take 1 tablet (8 mg total) by mouth 2 (two) times daily as needed (Nausea or vomiting). 30 tablet 1  . oxyCODONE (OXY IR/ROXICODONE) 5 MG immediate release tablet Take 1-2 tablets (5-10 mg total) by mouth every 4 (four) hours as needed for moderate pain or severe pain. 120 tablet 0  . polyethylene glycol powder (GLYCOLAX/MIRALAX) 17 GM/SCOOP powder Take 17-34 g by mouth 2 (two) times daily as needed for moderate constipation. 500 g 5  . prochlorperazine (COMPAZINE) 10 MG tablet Take 1 tablet (10 mg total) by mouth every 6 (six) hours as needed (Nausea or vomiting). 30 tablet 1  . senna-docusate (SENOKOT-S) 8.6-50 MG tablet Take 2 tablets by mouth at bedtime.    [name] spironolactone (ALDACTONE) 25 MG tablet Take 1 tablet (25 mg total) by mouth daily. 30 tablet 3   No current facility-administered medications for this visit.    REVIEW OF SYSTEMS:   A 10+ POINT REVIEW OF SYSTEMS WAS OBTAINED including neurology, dermatology, psychiatry, cardiac, respiratory, lymph, extremities, GI, GU, Musculoskeletal, constitutional, breasts, reproductive, HEENT.  All pertinent positives are noted in the HPI.  All others are negative.     PHYSICAL EXAMINATION: There were no vitals filed for this visit. Wt Readings from Last 3 Encounters:  10/23/19 153 lb 12.8 oz (69.8 kg)  10/16/19 156 lb (70.8 kg)  10/11/19 157 lb 11.2 oz (71.5 kg)   There is no height or weight on file to calculate BMI.  ECOG FS:2 - Symptomatic, <50% confined to bed  Telehealth Visit 10/24/19  LABORATORY DATA:  I have reviewed the data as listed  . CBC Latest Ref Rng & Units 10/11/2019 10/02/2019 10/01/2019  WBC 4.0 - 10.5 K/uL 12.3(H) 5.3 7.2  Hemoglobin 13.0 - 17.0 g/dL 11.8(L) 11.2(L) 11.4(L)    Hematocrit 39.0 - 52.0 % 36.6(L) 35.5(L) 35.9(L)  Platelets 150 - 400 K/uL 187 136(L) 128(L)    . CMP Latest Ref Rng & Units 10/23/2019 10/11/2019 10/02/2019  Glucose 70 - 99 mg/dL 123(H) 97 99  BUN 8 - 23 mg/dL 24(H) 30(H) 10  Creatinine 0.61 - 1.24 mg/dL 1.38(H) 1.42(H) 1.08  Sodium 135 - 145 mmol/L 138 139 140  Potassium 3.5 - 5.1 mmol/L 4.8 4.8 3.7  Chloride 98 - 111 mmol/L 101 104 101  CO2 22 - 32 mmol/L '26 26 31  '$ Calcium 8.9 - 10.3 mg/dL 9.5 8.9 8.3(L)  Total Protein 6.5 - 8.1 g/dL 5.9(L) 6.1(L) -  Total Bilirubin 0.3 - 1.2 mg/dL 0.6 0.4 -  Alkaline Phos 38 - 126 U/L 56 60 -  AST 15 - 41 U/L 37 30 -  ALT 0 - 44 U/L 82(H) 60(H) -   Component     Latest Ref Rng & Units 04/30/2019  Total Protein, Urine-UPE24     Not Estab. mg/dL   Total Protein, Urine-Ur/day     30 - 150 mg/24 hr   ALBUMIN, U     %   ALPHA 1 URINE     %   Alpha 2, Urine     %   % BETA, Urine     %   GAMMA GLOBULIN URINE     %   Free Kappa Lt Chains,Ur     0.63 - 113.79 mg/L   Free Lambda Lt Chains,Ur     0.47 - 11.77 mg/L   Free Kappa/Lambda Ratio     1.03 - 31.76   Immunofixation Result, Urine        Total Volume        M-SPIKE %, Urine     Not Observed %   M-Spike, mg/24 hr     Not Observed mg/24 hr   NOTE:        IgG (Immunoglobin G), Serum     603 - 1,613 mg/dL 1,740 (H)  IgA     61 - 437 mg/dL 16 (L)  IgM (Immunoglobulin M), Srm     15 - 143 mg/dL 9 (L)  Total Protein ELP     6.0 - 8.5 g/dL 7.3  Albumin SerPl Elph-Mcnc     2.9 - 4.4 g/dL 3.8  Alpha 1     0.0 - 0.4 g/dL 0.3  Alpha2 Glob SerPl Elph-Mcnc     0.4 - 1.0 g/dL 0.7  B-Globulin SerPl Elph-Mcnc     0.7 - 1.3 g/dL 0.9  Gamma Glob SerPl Elph-Mcnc     0.4 - 1.8 g/dL 1.6  M Protein SerPl Elph-Mcnc     Not Observed g/dL 1.5 (H)  Globulin, Total     2.2 - 3.9 g/dL 3.5  Albumin/Glob SerPl     0.7 - 1.7 1.1  IFE 1      Comment  Please Note (HCV):      Comment  Kappa free light chain     3.3 - 19.4 mg/L 821.2 (H)   Lamda free light chains     5.7 - 26.3 mg/L 1.9 (L)  Kappa, lamda light chain ratio  0.26 - 1.65 432.21 (H)  LDH     98 - 192 U/L 132  Sed Rate     0 - 16 mm/hr 3   05/24/2019 FISH Panel     05/24/2019 Bone Marrow Biopsy    04/16/2019 Surgical Pathology:   RADIOGRAPHIC STUDIES: I have personally reviewed the radiological images as listed and agreed with the findings in the report. CT Angio Chest PE W/Cm &/Or Wo Cm  Result Date: 09/30/2019 CLINICAL DATA:  Hypoxia, shortness of breath EXAM: CT ANGIOGRAPHY CHEST WITH CONTRAST TECHNIQUE: Multidetector CT imaging of the chest was performed using the standard protocol during bolus administration of intravenous contrast. Multiplanar CT image reconstructions and MIPs were obtained to evaluate the vascular anatomy. CONTRAST:  40m OMNIPAQUE IOHEXOL 350 MG/ML SOLN COMPARISON:  04/06/2019 FINDINGS: Cardiovascular: Aortic Root: --Valve: 3.4 cm --Sinuses: 3.4 cm --Sinotubular Junction: 3.4 cm Limitations by motion: Mild Thoracic Aorta: --Ascending Aorta: 4.5 cm (stable since 03/14/2019) --Aortic Arch: 3.2 cm (proximal) --Descending Aorta: 3.3 cm Other: Heart size upper limits normal. Small pericardial effusion. Satisfactory opacification of pulmonary arteries noted, and there is no evidence of pulmonary emboli. Scattered coronary calcifications. Interval TAVR. Mediastinum/Nodes: No hilar or mediastinal adenopathy. Fluid distends the esophagus. Lungs/Pleura: No pleural effusion. No pneumothorax. Patchy airspace opacities in the posterior segment right upper lobe, throughout the right lower lobe, and to a minimal degree in the posterior left lung base, new since previous. Upper Abdomen: No acute findings. Musculoskeletal: Anterior vertebral endplate spurring at multiple levels in the mid and lower thoracic spine. T9 and T12 compression deformities. Review of the MIP images confirms the above findings. IMPRESSION: 1. Negative for acute PE or thoracic  aortic dissection. 2. New patchy airspace opacities predominantly in the right lower lobe, also seen in the posterior upper lobe and to a minimal degree in the posterior left lung base, likely infectious/inflammatory. 3. Fluid distends the esophagus suggesting reflux disease or dysmotility. 4. Stable 4.5 cm ascending thoracic aortic aneurysm. Recommend semi-annual imaging followup by CTA or MRA and referral to cardiothoracic surgery if not already obtained. This recommendation follows 2010 ACCF/AHA/AATS/ACR/ASA/SCA/SCAI/SIR/STS/SVM Guidelines for the Diagnosis and Management of Patients With Thoracic Aortic Disease. Circulation. 2010; 121:: [id number] Signed   By: DLucrezia EuropeM.D.   On: 09/30/2019 14:46   [place]  Result Date: 10/01/2019 CLINICAL DATA:  Acute renal injury EXAM: RENAL / URINARY TRACT ULTRASOUND COMPLETE COMPARISON:  None. FINDINGS: Right Kidney: Renal measurements: 11.3 x 5.2 x 5.2 cm = volume: 159 mL. No hydronephrosis is noted. A 1.2 cm upper pole cyst is seen. Left Kidney: Renal measurements: 11.0 x 5.5 x 5.1 cm = volume: 160 mL. No hydronephrosis is noted. 3.3 cm cyst is noted in the upper pole. Bladder: Well distended. Multiple bladder diverticula are noted. Prostate is mildly prominent. Other: None. IMPRESSION: Bilateral renal cysts.  No obstructive changes are noted. Multiple bladder diverticula. Electronically Signed   By: MInez CatalinaM.D.   On: 10/01/2019 11:59   DG Chest Portable 1 View  Result Date: 09/30/2019 CLINICAL DATA:  79year old male with history of hypoxia. Polyuria. Generalized weakness. EXAM: PORTABLE CHEST 1 VIEW COMPARISON:  Chest x-ray 09/14/2019. FINDINGS: Elongated nodular density projecting over the right upper lobe, new compared to the prior study. No pleural effusions. No evidence of pulmonary edema. No pneumothorax. Heart size is normal. Upper mediastinal contours are within normal limits. Status post transcatheter aortic valve replacement.  IMPRESSION: 1. New elongated nodular density projecting over the region of the right  upper lobe, presumably infectious or inflammatory in etiology given the rapid development over the past 2 weeks. This could be better evaluated with follow-up nonemergent noncontrast chest CT if clinically appropriate. At the very least, follow-up standing PA and lateral chest radiographs are recommended in the next 2-3 weeks to ensure the stability or resolution of this finding. 2. Aortic atherosclerosis. 3. Status post TAVR. Electronically Signed   By: Vinnie Langton M.D.   On: 09/30/2019 12:50   DG Swallowing Func-Speech Pathology  Result Date: 10/02/2019 Objective Swallowing Evaluation: Type of Study: MBS-Modified Barium Swallow Study  Patient Details Name: [name] [name] MRN: [id number] Date of Birth: 1940/10/27 Today's Date: 10/02/2019 Time: SLP Start Time (ACUTE ONLY): 1000 -SLP Stop Time (ACUTE ONLY): 1020 SLP Time Calculation (min) (ACUTE ONLY): 20 min Past Medical History: Past Medical History: Diagnosis Date . Ascending aortic aneurysm ([place])  . Bicuspid aortic valve  . Cancer ([place])  . CHF NYHA class I (no symptoms from ordinary activities), acute, diastolic ([place])  . Fatty liver   mild . GI bleeding 07/21/2018  post polypectomy . Hemorrhoids  . HTN (hypertension)  . Hypercholesteremia  . Hypokalemia  . Internal hemorrhoids  . LBP (low back pain)  . Moderate aortic stenosis  . Osteoarthritis  . Paroxysmal atrial fibrillation ([place])   a. new onset Afib in 07/2008. He underwent ibutilide cardioversion successfully. b. Recurrence 01/2013 s/p TEE/DCCV - was on Xarelto but he stopped it as he was convinced it was causing joint pn. c. Recurrence 01/2016 - spont conv to NSR. Pt took Eliquis x1 mo then declined further anticoag. d. Recurrence 07/2016. [name] Pneumonia  . Tubular adenoma of colon  Past Surgical History: Past Surgical History: Procedure Laterality Date . BACK SURGERY  x12 years ago . CARDIOVERSION N/A 01/26/2013  Procedure:  CARDIOVERSION;  Surgeon: Larey Dresser, MD;  Location: [facility] ENDOSCOPY;  Service: Cardiovascular;  Laterality: N/A; . CARDIOVERSION N/A 10/28/2017  Procedure: CARDIOVERSION;  Surgeon: Larey Dresser, MD;  Location: [facility] ENDOSCOPY;  Service: Cardiovascular;  Laterality: N/A; . CARDIOVERSION N/A 03/03/2018  Procedure: CARDIOVERSION;  Surgeon: Lelon Perla, MD;  Location: [facility] ENDOSCOPY;  Service: Cardiovascular;  Laterality: N/A; . CARDIOVERSION N/A 09/19/2019  Procedure: CARDIOVERSION;  Surgeon: Larey Dresser, MD;  Location: [facility] ENDOSCOPY;  Service: Cardiovascular;  Laterality: N/A; . COLONOSCOPY   . COLONOSCOPY  07/17/2018  at [facility] . HEMORRHOID SURGERY   . LUMBAR LAMINECTOMY   . POLYPECTOMY   . RIGHT HEART CATH N/A 08/20/2019  Procedure: RIGHT HEART CATH;  Surgeon: Larey Dresser, MD;  Location: [place] CV LAB;  Service: Cardiovascular;  Laterality: N/A; . RIGHT/LEFT HEART CATH AND CORONARY ANGIOGRAPHY N/A 03/07/2019  Procedure: RIGHT/LEFT HEART CATH AND CORONARY ANGIOGRAPHY;  Surgeon: Burnell Blanks, MD;  Location: [place] CV LAB;  Service: Cardiovascular;  Laterality: N/A; . [name]  04/2019 . TEE WITHOUT CARDIOVERSION N/A 01/26/2013  Procedure: TRANSESOPHAGEAL ECHOCARDIOGRAM (TEE);  Surgeon: Larey Dresser, MD;  Location: [place];  Service: Cardiovascular;  Laterality: N/A; . TEE WITHOUT CARDIOVERSION N/A 10/28/2017  Procedure: TRANSESOPHAGEAL ECHOCARDIOGRAM (TEE);  Surgeon: Larey Dresser, MD;  Location: [facility] ENDOSCOPY;  Service: Cardiovascular;  Laterality: N/A; . TEE WITHOUT CARDIOVERSION N/A 05/08/2019  Procedure: TRANSESOPHAGEAL ECHOCARDIOGRAM (TEE);  Surgeon: Burnell Blanks, MD;  Location: [place] CV LAB;  Service: Open Heart Surgery;  Laterality: N/A; . TEE WITHOUT CARDIOVERSION N/A 09/19/2019  Procedure: TRANSESOPHAGEAL ECHOCARDIOGRAM (TEE);  Surgeon: Larey Dresser, MD;  Location: [facility] ENDOSCOPY;  Service: Cardiovascular;  Laterality: N/A; .  TRANSCATHETER AORTIC VALVE  REPLACEMENT, TRANSFEMORAL N/A 05/08/2019  Procedure: TRANSCATHETER AORTIC VALVE REPLACEMENT, TRANSFEMORAL;  Surgeon: Burnell Blanks, MD;  Location: [place] CV LAB;  Service: Open Heart Surgery;  Laterality: N/A; HPI:  79 y.o. male with medical history significant of A. Fib on eliquis, multiple myeloma, ascending aortic aneurysm, hypertension, hyperlipidemia, moderate aortic stenosis, chronic diastolic heart failure status post TAVR's in August 2020 presented with polyuria and generalized weakness for 2 days. Subjective: Pt seen in radiology for MBS Assessment / Plan / Recommendation CHL IP CLINICAL IMPRESSIONS 10/02/2019 Clinical Impression Pt presents with mild pharyngeal dysphagia, characterized by swallow reflex trigger at the level of the vallecular sinus across consistencies, with occasional trigger at the pyriform sinus. Trace flash penetration was noted x1 on thin liquids during large and consecutive boluses. This was not replicated despite multiple repetitions.  No other penetration or aspiration was seen on any consistency tested. Trace vallecular residue was noted following the swallow of soft solids, otherwise no post-swallow residue seen. Esophageal sweep revealed a small Zenker's diverticulum in the upper esophagus (confirmed by radiologist). Recommend continuing with current (regular) diet and thin liquids. Safe swallow precautions sent back to pt room with transport. No further ST intervention is recommended at this time.  SLP Visit Diagnosis Dysphagia, oropharyngeal phase (R13.12)     Impact on safety and function Mild aspiration risk   CHL IP TREATMENT RECOMMENDATION 10/02/2019 Treatment Recommendations No treatment recommended at this time   Prognosis 10/02/2019 Prognosis for Safe Diet Advancement Good     CHL IP DIET RECOMMENDATION 10/02/2019 SLP Diet Recommendations Regular solids;Thin liquid Liquid Administration via Cup;Straw Medication Administration Whole meds with liquid  Compensations Slow rate;Small sips/bites Postural Changes Seated upright at 90 degrees   CHL IP OTHER RECOMMENDATIONS 10/02/2019   Oral Care Recommendations Oral care BID     CHL IP FOLLOW UP RECOMMENDATIONS 10/02/2019 Follow up Recommendations None      CHL IP ORAL PHASE 10/02/2019 Oral Phase WFL  CHL IP PHARYNGEAL PHASE 10/02/2019 Pharyngeal Phase Impaired   Pharyngeal- Nectar Cup Delayed swallow initiation-vallecula;Delayed swallow initiation-pyriform sinuses Pharyngeal- Thin Cup Delayed swallow initiation-vallecula;Delayed swallow initiation-pyriform sinuses;Reduced airway/laryngeal closure;Penetration/Aspiration during swallow Pharyngeal Material does not enter airway;Material enters airway, remains ABOVE vocal cords then ejected out Pharyngeal- Puree Delayed swallow initiation-vallecula Pharyngeal- Mechanical Soft Delayed swallow initiation-vallecula;Pharyngeal residue - valleculae Pharyngeal- Pill WFL  CHL IP CERVICAL ESOPHAGEAL PHASE 10/02/2019 Cervical Esophageal Phase Impaired - small Zenker's diverticulum noted. Confirmed by radiologist. Enriqueta Shutter. Quentin Ore, [facility] [place] Speech Language Pathologist Office: [phone] Pager: [phone] Shonna Chock 10/02/2019, 11:41 AM               ASSESSMENT & PLAN:   79 yo   #1 Recently diagnosed Plasma cell myeloma  03/28/2019 MRI pelvis w/wo contrast revealed "1. Destructive bone lesions as detailed above. Findings most consistent with metastatic disease. PET-CT may be helpful for further evaluation and to establish a primary tumor. The right pelvic bone lesions should be amenable to image guided biopsy but a PET scan may demonstrate easier/safer biopsy sites. 2. No intrapelvic mass or adenopathy. 3. Benign intraosseous lipoma involving the left anterior superior acetabulum."  04/06/2019 PET whole body revealed "1. Diffuse osseous metastatic disease as detailed above without findings for a primary neoplasm in the chest, abdomen or pelvis. The large  destructive lesion involving the right ischium should be amenable to image guided biopsy. 2. Two small retroperitoneal lymph nodes and 1 small right obturator node showing hypermetabolism."   04/16/2019 Posterior  right pelvis bone biopsy revealed "PLASMA CELL NEOPLASM"  05/24/2019 Bone Marrow Biopsy revealed "BONE MARROW: - CELLULAR MARROW WITH INVOLVEMENT BY PLASMA CELL NEOPLASM (20%) PERIPHERAL BLOOD: - MORPHOLOGICALLY UNREMARKABLE"  05/24/2019 FISH Panel revealed "ABNORMAL result with 11q+, 14q+ and +17"  #2 Severe aortic stenosis with bicuspid aortic valve -10/26/2018 ECHO revealed AVA at 0.8 cm2 and LV EF of 60-65% -05/08/2019 pt had a Transfemoral Transcatheter Aortic Valve Replacement   PLAN:  -Discussed pt labwork today, 10/23/19;  all values are WNL except for Glucose at 123, BUN at 24, Creatinine Ser at 1.38, Total Protein at 5.9, ALT at 82, GFR calc non Af Amer at 48, GFR calc Af Amer at 46, TSH at 12.342. Myeloma panel with absent M spike now suggesting excellent treatment response. -Discussed that TSH -Recommended that nutrition,PT and optimizing his cardiac issues should be primary concern -Continue to monitor potassium levels -Discussed that as neuropathy decreases we will decrease gabapentin  -Advised to continue nausea medication as needed. Recommended to contact Dr.[name] about taking the medication. -Recommended that the pt continue to eat well, drink at least 48-64 oz of water each day, and walk 20-30 minutes each day. -Will repeat PET scan and bone marrow biopsy in 6 week. Results will determine next steps in treatment. -Will see back in 2 months   The total time spent in the appt was 30 minutes and more than 50% was on counseling and direct patient cares.  All of the patient's questions were answered with apparent satisfaction. The patient knows to call the clinic with any problems, questions or concerns.   Sullivan Lone MD MS AAHIVMS [facility]  [facility] Hematology/Oncology Physician [facility]  (Office):       [phone] (Work cell):  [phone] (Fax):           [phone]  10/24/2019 8:17 PM  I, Scot Dock, am acting as a scribe for Dr. Sullivan Lone.   .I have reviewed the above documentation for accuracy and completeness, and I agree with the above. Brunetta Genera MD

## 2019-10-24 NOTE — Telephone Encounter (Signed)
-----   Message from Larey Dresser, MD sent at 10/23/2019  4:33 PM EST ----- Elelvated TSH.  Needs to return to have free T3, free T4 and TSH redrawn.  Mildly elevated ALT, will need to follow.

## 2019-10-24 NOTE — Telephone Encounter (Signed)
Wife aware of results. They will return this afternoon for repeat and additional blood work. Verbalized understanding

## 2019-10-25 ENCOUNTER — Inpatient Hospital Stay: Payer: Medicare Other | Attending: Hematology | Admitting: Hematology

## 2019-10-25 DIAGNOSIS — C9000 Multiple myeloma not having achieved remission: Secondary | ICD-10-CM | POA: Diagnosis not present

## 2019-10-25 DIAGNOSIS — C7951 Secondary malignant neoplasm of bone: Secondary | ICD-10-CM

## 2019-10-25 DIAGNOSIS — G629 Polyneuropathy, unspecified: Secondary | ICD-10-CM

## 2019-10-25 LAB — T3, FREE: T3, Free: 1.8 pg/mL — ABNORMAL LOW (ref 2.0–4.4)

## 2019-10-26 DIAGNOSIS — C7951 Secondary malignant neoplasm of bone: Secondary | ICD-10-CM | POA: Diagnosis not present

## 2019-10-26 DIAGNOSIS — G620 Drug-induced polyneuropathy: Secondary | ICD-10-CM | POA: Diagnosis not present

## 2019-10-26 DIAGNOSIS — K559 Vascular disorder of intestine, unspecified: Secondary | ICD-10-CM | POA: Diagnosis not present

## 2019-10-26 DIAGNOSIS — T451X5D Adverse effect of antineoplastic and immunosuppressive drugs, subsequent encounter: Secondary | ICD-10-CM | POA: Diagnosis not present

## 2019-10-26 DIAGNOSIS — C9000 Multiple myeloma not having achieved remission: Secondary | ICD-10-CM | POA: Diagnosis not present

## 2019-10-26 DIAGNOSIS — J189 Pneumonia, unspecified organism: Secondary | ICD-10-CM | POA: Diagnosis not present

## 2019-10-30 DIAGNOSIS — K559 Vascular disorder of intestine, unspecified: Secondary | ICD-10-CM | POA: Diagnosis not present

## 2019-10-30 DIAGNOSIS — C9000 Multiple myeloma not having achieved remission: Secondary | ICD-10-CM | POA: Diagnosis not present

## 2019-10-30 DIAGNOSIS — J189 Pneumonia, unspecified organism: Secondary | ICD-10-CM | POA: Diagnosis not present

## 2019-10-30 DIAGNOSIS — T451X5D Adverse effect of antineoplastic and immunosuppressive drugs, subsequent encounter: Secondary | ICD-10-CM | POA: Diagnosis not present

## 2019-10-30 DIAGNOSIS — G620 Drug-induced polyneuropathy: Secondary | ICD-10-CM | POA: Diagnosis not present

## 2019-10-30 DIAGNOSIS — C7951 Secondary malignant neoplasm of bone: Secondary | ICD-10-CM | POA: Diagnosis not present

## 2019-10-31 ENCOUNTER — Telehealth: Payer: Self-pay

## 2019-10-31 DIAGNOSIS — T451X5D Adverse effect of antineoplastic and immunosuppressive drugs, subsequent encounter: Secondary | ICD-10-CM | POA: Diagnosis not present

## 2019-10-31 DIAGNOSIS — K559 Vascular disorder of intestine, unspecified: Secondary | ICD-10-CM | POA: Diagnosis not present

## 2019-10-31 DIAGNOSIS — J189 Pneumonia, unspecified organism: Secondary | ICD-10-CM | POA: Diagnosis not present

## 2019-10-31 DIAGNOSIS — C9000 Multiple myeloma not having achieved remission: Secondary | ICD-10-CM | POA: Diagnosis not present

## 2019-10-31 DIAGNOSIS — G620 Drug-induced polyneuropathy: Secondary | ICD-10-CM | POA: Diagnosis not present

## 2019-10-31 DIAGNOSIS — C7951 Secondary malignant neoplasm of bone: Secondary | ICD-10-CM | POA: Diagnosis not present

## 2019-10-31 NOTE — Telephone Encounter (Signed)
New message    Need verbal order for Speech therapy    Extended physical therapy 1-week x 2 weeks starting on March 1st.

## 2019-11-01 NOTE — Telephone Encounter (Signed)
Verbals given, FYI 

## 2019-11-02 ENCOUNTER — Other Ambulatory Visit: Payer: Self-pay | Admitting: *Deleted

## 2019-11-02 NOTE — Patient Outreach (Signed)
Telephone outreach.  No answer today, able to leave a message and requested a return call.  Eulah Pont. Myrtie Neither, MSN, [facility] Gerontological Nurse Practitioner [facility] Care Management [phone]

## 2019-11-05 ENCOUNTER — Other Ambulatory Visit: Payer: Self-pay | Admitting: *Deleted

## 2019-11-05 ENCOUNTER — Telehealth: Payer: Self-pay | Admitting: Neurology

## 2019-11-05 DIAGNOSIS — J189 Pneumonia, unspecified organism: Secondary | ICD-10-CM | POA: Diagnosis not present

## 2019-11-05 DIAGNOSIS — K559 Vascular disorder of intestine, unspecified: Secondary | ICD-10-CM | POA: Diagnosis not present

## 2019-11-05 DIAGNOSIS — C9000 Multiple myeloma not having achieved remission: Secondary | ICD-10-CM | POA: Diagnosis not present

## 2019-11-05 DIAGNOSIS — G620 Drug-induced polyneuropathy: Secondary | ICD-10-CM | POA: Diagnosis not present

## 2019-11-05 DIAGNOSIS — T451X5D Adverse effect of antineoplastic and immunosuppressive drugs, subsequent encounter: Secondary | ICD-10-CM | POA: Diagnosis not present

## 2019-11-05 DIAGNOSIS — C7951 Secondary malignant neoplasm of bone: Secondary | ICD-10-CM | POA: Diagnosis not present

## 2019-11-05 NOTE — Telephone Encounter (Signed)
Please advise on below  

## 2019-11-05 NOTE — Telephone Encounter (Signed)
Plan to do EMG first - please order EMG BLE.  We will contact patient about follow-up after these results are available.

## 2019-11-05 NOTE — Patient Outreach (Signed)
Telephone outreach.  Mrs. Polson reports her husband is making small improvements in his mobility and intake. He has gained a small amount of wt. They did discover that he loses his balance when he closes his eyes and this is why Zora believes he fell when he was in the bathroom trying to urinate.  She asks if I can assist him to get signed up for a COVID vaccine. Advised I would try to do so and inform her.   NP has put pt on [place] vaccine wait list.  Reminded Mrs. Snodgrass to call if they need anything. I will call again in one week.  Eulah Pont. Myrtie Neither, MSN, [facility] Gerontological Nurse Practitioner [facility] Care Management [phone]

## 2019-11-05 NOTE — Telephone Encounter (Signed)
Pt is sch for EMG on 11-21-19

## 2019-11-05 NOTE — Patient Outreach (Signed)
Correction: I will call in 2 weeks. CCS

## 2019-11-05 NOTE — Telephone Encounter (Signed)
Patient's spouse called regarding him needing to schedule an EMG test. He was also needing to have a follow up appt as well. Should he have the EMG first or the follow up? Please Advise.  He also mentioned he could not do a vv. Thank you

## 2019-11-06 NOTE — Progress Notes (Signed)
Subjective:    Patient ID: [name] [name], male    DOB: 12/31/1940, 79 y.o.   MRN: [id number]  HPI The patient is here for an acute visit.  His wife is with him today.   Dysuria, frequent urination:   He started having symptoms yesterday.  Last night he was up every 25-40 minutes to urinate.  He was having dysuria.  He was having some urinary incontinence-he was not able to make it to the bathroom.  He was only going a very small amount.    Left Inguinal hernia:  He has increased size in the hernia.  It is the size of a baseball.  It has started to hurt in the past couple of weeks.  The pain is intermittent.  It typically hurts after going to the bathroom  - urinating. The other night he thought he may need to go to the ED due to the pain.  He is experiencing increased constipation as well.  He is taking a stool softener daily and his wife plans on starting him on MiraLAX today.  Medications and allergies reviewed with patient and updated if appropriate.  Patient Active Problem List   Diagnosis Date Noted  . Physical deconditioning   . Depression with anxiety   . Acute respiratory failure with hypoxia ([place])   . Ischemic colitis ([place]) 09/18/2019  . Pressure injury of skin 09/16/2019  . Bloody diarrhea 09/14/2019  . Abdominal pain, diffuse 09/14/2019  . Acute metabolic encephalopathy 82/50/0370  . Goals of care, counseling/discussion 09/14/2019  . DNR (do not resuscitate) 09/14/2019  . Peripheral neuropathy 09/13/2019  . Gait disorder 09/13/2019  . Multiple myeloma not having achieved remission ([place]) 05/17/2019  . Counseling regarding advance care planning and goals of care 05/17/2019  . Bone metastases ([place]) 05/17/2019  . Postoperative fever 05/09/2019  . S/P TAVR (transcatheter aortic valve replacement) 05/08/2019  . Multiple myeloma ([place]) 05/08/2019  . Acute on chronic diastolic heart failure ([place]) 05/08/2019  . Severe aortic stenosis   . Polymyalgia rheumatica syndrome ([place])  01/29/2019  . CAD (coronary artery disease) 08/01/2018  . HTN (hypertension) 02/28/2018  . Hypercholesteremia 02/28/2018  . Mastoiditis 03/21/2017  . Abnormal TSH 12/23/2016  . PNA (pneumonia)   . AKI (acute kidney injury) ([place])   . Ascending aortic aneurysm ([place]) 02/15/2013  . Atrial fibrillation with RVR ([place]) 01/23/2013  . Well adult exam 05/31/2012  . PAF (paroxysmal atrial fibrillation) ([place]) 08/05/2008  . HLD (hyperlipidemia) 04/08/2007  . Essential hypertension 04/08/2007    Current Outpatient Medications on File Prior to Visit  Medication Sig Dispense Refill  . acyclovir (ZOVIRAX) 400 MG tablet Take 1 tablet (400 mg total) by mouth 2 (two) times daily. 180 tablet 3  . Amino Acids-Protein Hydrolys (PRO-STAT 101) LIQD Take 30 mLs by mouth daily. 887 mL 5  . amiodarone (PACERONE) 200 MG tablet Take 1 tablet (200 mg total) by mouth daily. 30 tablet 1  . amLODipine (NORVASC) 5 MG tablet Take 1 tablet (5 mg total) by mouth daily. 90 tablet 3  . apixaban (ELIQUIS) 5 MG TABS tablet Take 5 mg by mouth 2 (two) times daily.    [name] b complex vitamins tablet Take 1 tablet by mouth daily. 100 tablet 3  . Calcium-Magnesium 500-250 MG TABS Take 1 tablet by mouth daily.    . Carboxymethylcellul-Glycerin (LUBRICATING EYE DROPS OP) Place 1 drop into both eyes daily as needed (dry eyes).    . Cholecalciferol (VITAMIN D) 50 MCG (2000 UT)  tablet Take 2,000 Units by mouth daily.    [name] dexamethasone (DECADRON) 4 MG tablet Take 3 tablets (12 mg total) by mouth once a week. 22 tablet 1  . diclofenac Sodium (VOLTAREN) 1 % GEL Apply 1 application topically 4 (four) times daily as needed (pain).    . DULoxetine (CYMBALTA) 60 MG capsule Take 1 capsule (60 mg total) by mouth daily. 30 capsule 2  . ergocalciferol (VITAMIN D2) 1.25 MG (50000 UT) capsule Take 50,000 Units by mouth once a week. Every Monday    . famotidine (PEPCID) 40 MG tablet Take 1 tablet (40 mg total) by mouth daily. 30 tablet 11  . fentaNYL  (DURAGESIC) 12 MCG/HR Place 1 patch onto the skin every 3 (three) days. 10 patch 0  . furosemide (LASIX) 20 MG tablet Take 20 mg by mouth every other day.    . gabapentin (NEURONTIN) 300 MG capsule Take 1 capsule (300 mg total) by mouth 3 (three) times daily.    [name] loratadine (CLARITIN) 10 MG tablet Take 1 tablet (10 mg total) by mouth daily. 30 tablet 11  . LORazepam (ATIVAN) 0.5 MG tablet Take 1 tablet (0.5 mg total) by mouth every 8 (eight) hours as needed (Nausea or vomiting). 60 tablet 2  . metoprolol succinate (TOPROL-XL) 25 MG 24 hr tablet Take 1 tablet (25 mg total) by mouth 2 (two) times daily. 60 tablet 6  . ondansetron (ZOFRAN) 8 MG tablet Take 1 tablet (8 mg total) by mouth 2 (two) times daily as needed (Nausea or vomiting). 30 tablet 1  . oxyCODONE (OXY IR/ROXICODONE) 5 MG immediate release tablet Take 1-2 tablets (5-10 mg total) by mouth every 4 (four) hours as needed for moderate pain or severe pain. 120 tablet 0  . polyethylene glycol powder (GLYCOLAX/MIRALAX) 17 GM/SCOOP powder Take 17-34 g by mouth 2 (two) times daily as needed for moderate constipation. 500 g 5  . prochlorperazine (COMPAZINE) 10 MG tablet Take 1 tablet (10 mg total) by mouth every 6 (six) hours as needed (Nausea or vomiting). 30 tablet 1  . senna-docusate (SENOKOT-S) 8.6-50 MG tablet Take 2 tablets by mouth at bedtime.    [name] spironolactone (ALDACTONE) 25 MG tablet Take 1 tablet (25 mg total) by mouth daily. 30 tablet 3   No current facility-administered medications on file prior to visit.    Past Medical History:  Diagnosis Date  . Ascending aortic aneurysm ([place])   . Bicuspid aortic valve   . Cancer ([place])   . CHF NYHA class I (no symptoms from ordinary activities), acute, diastolic ([place])   . Fatty liver    mild  . GI bleeding 07/21/2018   post polypectomy  . Hemorrhoids   . HTN (hypertension)   . Hypercholesteremia   . Hypokalemia   . Internal hemorrhoids   . LBP (low back pain)   . Moderate aortic  stenosis   . Osteoarthritis   . Paroxysmal atrial fibrillation ([place])    a. new onset Afib in 07/2008. He underwent ibutilide cardioversion successfully. b. Recurrence 01/2013 s/p TEE/DCCV - was on Xarelto but he stopped it as he was convinced it was causing joint pn. c. Recurrence 01/2016 - spont conv to NSR. Pt took Eliquis x1 mo then declined further anticoag. d. Recurrence 07/2016.  [name] Pneumonia   . Tubular adenoma of colon     Past Surgical History:  Procedure Laterality Date  . BACK SURGERY  x12 years ago  . CARDIOVERSION N/A 01/26/2013   Procedure: CARDIOVERSION;  Surgeon:  Larey Dresser, MD;  Location: [place];  Service: Cardiovascular;  Laterality: N/A;  . CARDIOVERSION N/A 10/28/2017   Procedure: CARDIOVERSION;  Surgeon: Larey Dresser, MD;  Location[facility] ENDOSCOPY;  Service: Cardiovascular;  Laterality: N/A;  . CARDIOVERSION N/A 03/03/2018   Procedure: CARDIOVERSION;  Surgeon: Lelon Perla, MD;  Location: [facility] ENDOSCOPY;  Service: Cardiovascular;  Laterality: N/A;  . CARDIOVERSION N/A 09/19/2019   Procedure: CARDIOVERSION;  Surgeon: Larey Dresser, MD;  Location: [facility] ENDOSCOPY;  Service: Cardiovascular;  Laterality: N/A;  . COLONOSCOPY    . COLONOSCOPY  07/17/2018   at [facility]  . HEMORRHOID SURGERY    . LUMBAR LAMINECTOMY    . POLYPECTOMY    . RIGHT HEART CATH N/A 08/20/2019   Procedure: RIGHT HEART CATH;  Surgeon: Larey Dresser, MD;  Location: [place] CV LAB;  Service: Cardiovascular;  Laterality: N/A;  . RIGHT/LEFT HEART CATH AND CORONARY ANGIOGRAPHY N/A 03/07/2019   Procedure: RIGHT/LEFT HEART CATH AND CORONARY ANGIOGRAPHY;  Surgeon: Burnell Blanks, MD;  Location: [place] CV LAB;  Service: Cardiovascular;  Laterality: N/A;  . [name]  04/2019  . TEE WITHOUT CARDIOVERSION N/A 01/26/2013   Procedure: TRANSESOPHAGEAL ECHOCARDIOGRAM (TEE);  Surgeon: Larey Dresser, MD;  Location: [place];  Service: Cardiovascular;  Laterality: N/A;  . TEE WITHOUT  CARDIOVERSION N/A 10/28/2017   Procedure: TRANSESOPHAGEAL ECHOCARDIOGRAM (TEE);  Surgeon: Larey Dresser, MD;  Location: [facility] ENDOSCOPY;  Service: Cardiovascular;  Laterality: N/A;  . TEE WITHOUT CARDIOVERSION N/A 05/08/2019   Procedure: TRANSESOPHAGEAL ECHOCARDIOGRAM (TEE);  Surgeon: Burnell Blanks, MD;  Location: [place] CV LAB;  Service: Open Heart Surgery;  Laterality: N/A;  . TEE WITHOUT CARDIOVERSION N/A 09/19/2019   Procedure: TRANSESOPHAGEAL ECHOCARDIOGRAM (TEE);  Surgeon: Larey Dresser, MD;  Location: [facility] ENDOSCOPY;  Service: Cardiovascular;  Laterality: N/A;  . TRANSCATHETER AORTIC VALVE REPLACEMENT, TRANSFEMORAL N/A 05/08/2019   Procedure: TRANSCATHETER AORTIC VALVE REPLACEMENT, TRANSFEMORAL;  Surgeon: Burnell Blanks, MD;  Location: [place] CV LAB;  Service: Open Heart Surgery;  Laterality: N/A;    Social History   Socioeconomic History  . Marital status: Married    Spouse name: Not on file  . Number of children: 0  . Years of education: Not on file  . Highest education level: Not on file  Occupational History  . Occupation: Retired Lobbyist: [employer]  Tobacco Use  . Smoking status: Never Smoker  . Smokeless tobacco: Never Used  Substance and Sexual Activity  . Alcohol use: Yes    Comment: Drinks 1 glass of wine nightly/socially  . Drug use: No  . Sexual activity: Yes  Other Topics Concern  . Not on file  Social History Narrative   Patient lives in [place] w/ his wife. He is a native of [place]. 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   Right-handed   Caffeine: 2 cups coffee per day   Two story home   Social Determinants of Health   Financial Resource Strain:   . Difficulty of Paying Living Expenses: Not on file  Food Insecurity:   . Worried About Charity fundraiser in the Last Year: Not on file  . Ran Out of Food in the Last Year: Not on file  Transportation Needs:   . Lack  of Transportation (Medical): Not on file  . Lack of Transportation (Non-Medical): Not on file  Physical Activity:   . Days of Exercise  per Week: Not on file  . Minutes of Exercise per Session: Not on file  Stress:   . Feeling of Stress : Not on file  Social Connections:   . Frequency of Communication with Friends and Family: Not on file  . Frequency of Social Gatherings with Friends and Family: Not on file  . Attends Religious Services: Not on file  . Active Member of Clubs or Organizations: Not on file  . Attends Archivist Meetings: Not on file  . Marital Status: Not on file    Family History  Problem Relation Age of Onset  . Colon cancer Mother 94  . Hypertension Other   . Coronary artery disease Neg Hx   . Colon polyps Neg Hx   . Esophageal cancer Neg Hx   . Rectal cancer Neg Hx   . Stomach cancer Neg Hx     Review of Systems  Constitutional: Negative for chills and fever.  Gastrointestinal: Positive for abdominal pain (from inguinal hernia) and constipation. Negative for nausea.  Genitourinary: Positive for dysuria, frequency and urgency. Negative for hematuria.       Objective:   Vitals:   11/07/19 0746  BP: 134/80  Pulse: (!) 57  Resp: 16  SpO2: 99%   BP Readings from Last 3 Encounters:  11/07/19 134/80  10/23/19 110/70  10/16/19 126/74   Wt Readings from Last 3 Encounters:  11/07/19 158 lb (71.7 kg)  10/23/19 153 lb 12.8 oz (69.8 kg)  10/16/19 156 lb (70.8 kg)   Body mass index is 22.04 kg/m.   Physical Exam Constitutional:      General: He is not in acute distress.    Appearance: Normal appearance. He is not ill-appearing.  Abdominal:     General: There is no distension.     Palpations: Abdomen is soft.     Tenderness: There is no abdominal tenderness. There is no right CVA tenderness, left CVA tenderness or guarding.     Hernia: A hernia (Left inguinal-size of a baseball, nontender) is present.  Skin:    General: Skin is warm and  dry.     Findings: No erythema.            Assessment & Plan:    See Problem List for Assessment and Plan of chronic medical problems.    This visit occurred during the SARS-CoV-2 public health emergency.  Safety protocols were in place, including screening questions prior to the visit, additional usage of staff PPE, and extensive cleaning of exam room while observing appropriate contact time as indicated for disinfecting solutions.

## 2019-11-07 ENCOUNTER — Encounter: Payer: Self-pay | Admitting: Internal Medicine

## 2019-11-07 ENCOUNTER — Ambulatory Visit (INDEPENDENT_AMBULATORY_CARE_PROVIDER_SITE_OTHER): Payer: Medicare Other | Admitting: Internal Medicine

## 2019-11-07 ENCOUNTER — Other Ambulatory Visit: Payer: Self-pay

## 2019-11-07 VITALS — BP 134/80 | HR 57 | Resp 16 | Ht 71.0 in | Wt 158.0 lb

## 2019-11-07 DIAGNOSIS — K559 Vascular disorder of intestine, unspecified: Secondary | ICD-10-CM | POA: Diagnosis not present

## 2019-11-07 DIAGNOSIS — G620 Drug-induced polyneuropathy: Secondary | ICD-10-CM | POA: Diagnosis not present

## 2019-11-07 DIAGNOSIS — N3000 Acute cystitis without hematuria: Secondary | ICD-10-CM | POA: Diagnosis not present

## 2019-11-07 DIAGNOSIS — T451X5D Adverse effect of antineoplastic and immunosuppressive drugs, subsequent encounter: Secondary | ICD-10-CM | POA: Diagnosis not present

## 2019-11-07 DIAGNOSIS — K4090 Unilateral inguinal hernia, without obstruction or gangrene, not specified as recurrent: Secondary | ICD-10-CM | POA: Diagnosis not present

## 2019-11-07 DIAGNOSIS — C7951 Secondary malignant neoplasm of bone: Secondary | ICD-10-CM | POA: Diagnosis not present

## 2019-11-07 DIAGNOSIS — C9000 Multiple myeloma not having achieved remission: Secondary | ICD-10-CM | POA: Diagnosis not present

## 2019-11-07 DIAGNOSIS — J189 Pneumonia, unspecified organism: Secondary | ICD-10-CM | POA: Diagnosis not present

## 2019-11-07 LAB — URINALYSIS
Bilirubin Urine: NEGATIVE
Hgb urine dipstick: NEGATIVE
Ketones, ur: NEGATIVE
Leukocytes,Ua: NEGATIVE
Nitrite: NEGATIVE
Specific Gravity, Urine: 1.025 (ref 1.000–1.030)
Total Protein, Urine: NEGATIVE
Urine Glucose: NEGATIVE
Urobilinogen, UA: 0.2 (ref 0.0–1.0)
pH: 5.5 (ref 5.0–8.0)

## 2019-11-07 MED ORDER — AMOXICILLIN-POT CLAVULANATE 875-125 MG PO TABS: 1.0000 | ORAL_TABLET | Freq: Two times a day (BID) | ORAL | 0 refills | Status: DC

## 2019-11-07 NOTE — Assessment & Plan Note (Signed)
Experiencing dysuria, increased urinary frequency, urgency and some urinary incontinence He is unsure if he will be able to provide a urine sample here today, but will try after drinking some water We will start empiric antibiotics-he has had UTIs in the past release he has been treated for them, but his urine cultures look like they were negative Start Augmentin twice daily x1 week If he is able to provide a urine sample will send for culture and determine if further evaluation of his urine symptoms are necessary

## 2019-11-07 NOTE — Assessment & Plan Note (Signed)
He has had an inguinal hernia, but over the past couple of weeks it has become painful and the other day the pain was significant-he thought he may need to go to the ED He would consider surgery Will refer to surgery for further evaluation and consideration of surgery

## 2019-11-07 NOTE — Patient Instructions (Addendum)
Start the antibiotic and take as directed.  We will call you with the results of your urine tests.  A referral was ordered for surgery - they will call you to schedule an appointment.     Please call if there is no improvement in your symptoms.

## 2019-11-08 ENCOUNTER — Other Ambulatory Visit: Payer: Self-pay | Admitting: Hematology

## 2019-11-08 LAB — URINE CULTURE: Result:: NO GROWTH

## 2019-11-09 ENCOUNTER — Encounter: Payer: Self-pay | Admitting: Internal Medicine

## 2019-11-12 ENCOUNTER — Ambulatory Visit: Payer: Medicare Other | Attending: Internal Medicine

## 2019-11-12 DIAGNOSIS — Z23 Encounter for immunization: Secondary | ICD-10-CM | POA: Insufficient documentation

## 2019-11-12 NOTE — Progress Notes (Signed)
   Covid-19 Vaccination Clinic  Name:  [name] [name]    MRN: [id number] DOB: Jan 12, 1941  11/12/2019  [name] was observed post Covid-19 immunization for 15 minutes without incidence. He was provided with Vaccine Information Sheet and instruction to access the V-Safe system.   [name] was instructed to call 911 with any severe reactions post vaccine: [name] Difficulty breathing  . Swelling of your face and throat  . A fast heartbeat  . A bad rash all over your body  . Dizziness and weakness    Immunizations Administered    Name Date Dose VIS Date Route   Pfizer COVID-19 Vaccine 11/12/2019 11:06 AM 0.3 mL 08/24/2019 Intramuscular   Manufacturer: [place]   Lot: [id number]   [place]: [id number]

## 2019-11-13 ENCOUNTER — Telehealth: Payer: Self-pay | Admitting: Hematology

## 2019-11-13 ENCOUNTER — Other Ambulatory Visit: Payer: Self-pay | Admitting: Hematology

## 2019-11-13 ENCOUNTER — Telehealth: Payer: Self-pay

## 2019-11-13 DIAGNOSIS — C9000 Multiple myeloma not having achieved remission: Secondary | ICD-10-CM

## 2019-11-13 DIAGNOSIS — G620 Drug-induced polyneuropathy: Secondary | ICD-10-CM | POA: Diagnosis not present

## 2019-11-13 DIAGNOSIS — K559 Vascular disorder of intestine, unspecified: Secondary | ICD-10-CM | POA: Diagnosis not present

## 2019-11-13 DIAGNOSIS — C7951 Secondary malignant neoplasm of bone: Secondary | ICD-10-CM

## 2019-11-13 DIAGNOSIS — J189 Pneumonia, unspecified organism: Secondary | ICD-10-CM | POA: Diagnosis not present

## 2019-11-13 DIAGNOSIS — T451X5D Adverse effect of antineoplastic and immunosuppressive drugs, subsequent encounter: Secondary | ICD-10-CM | POA: Diagnosis not present

## 2019-11-13 NOTE — Telephone Encounter (Signed)
Scheduled appt per 3/2 sch message - pt wife is aware of appt date and time

## 2019-11-13 NOTE — Telephone Encounter (Signed)
FYI

## 2019-11-13 NOTE — Telephone Encounter (Signed)
Radovan with [facility] calling and states that the patient will be discharged from Occupation Therapy as of today for meeting his goals. CB#: [phone]

## 2019-11-15 ENCOUNTER — Telehealth: Payer: Self-pay | Admitting: Internal Medicine

## 2019-11-15 NOTE — Telephone Encounter (Signed)
FYI: Patient declined speech therapy today when home health went out.

## 2019-11-15 NOTE — Telephone Encounter (Signed)
FYI

## 2019-11-19 ENCOUNTER — Other Ambulatory Visit: Payer: Self-pay | Admitting: *Deleted

## 2019-11-19 NOTE — Patient Outreach (Signed)
Telephone outreach.  Called and left a message for a return call.  [name] [name]. Myrtie Neither, MSN, [facility] Gerontological Nurse Practitioner [facility] Care Management [phone]

## 2019-11-20 ENCOUNTER — Other Ambulatory Visit: Payer: Self-pay | Admitting: Radiology

## 2019-11-20 ENCOUNTER — Other Ambulatory Visit: Payer: Self-pay | Admitting: Hematology

## 2019-11-20 DIAGNOSIS — T451X5D Adverse effect of antineoplastic and immunosuppressive drugs, subsequent encounter: Secondary | ICD-10-CM | POA: Diagnosis not present

## 2019-11-20 DIAGNOSIS — C9000 Multiple myeloma not having achieved remission: Secondary | ICD-10-CM | POA: Diagnosis not present

## 2019-11-20 DIAGNOSIS — C7951 Secondary malignant neoplasm of bone: Secondary | ICD-10-CM | POA: Diagnosis not present

## 2019-11-20 DIAGNOSIS — G620 Drug-induced polyneuropathy: Secondary | ICD-10-CM | POA: Diagnosis not present

## 2019-11-20 DIAGNOSIS — J189 Pneumonia, unspecified organism: Secondary | ICD-10-CM | POA: Diagnosis not present

## 2019-11-20 DIAGNOSIS — K559 Vascular disorder of intestine, unspecified: Secondary | ICD-10-CM | POA: Diagnosis not present

## 2019-11-21 ENCOUNTER — Encounter (HOSPITAL_COMMUNITY)
Admission: RE | Admit: 2019-11-21 | Discharge: 2019-11-21 | Disposition: A | Discharge status: Home / Self Care | Payer: Medicare Other | Source: Ambulatory Visit | Attending: Hematology | Admitting: Hematology

## 2019-11-21 ENCOUNTER — Ambulatory Visit (HOSPITAL_COMMUNITY): Payer: Medicare Other

## 2019-11-21 ENCOUNTER — Ambulatory Visit (INDEPENDENT_AMBULATORY_CARE_PROVIDER_SITE_OTHER): Payer: Medicare Other | Admitting: Neurology

## 2019-11-21 ENCOUNTER — Other Ambulatory Visit: Payer: Self-pay

## 2019-11-21 DIAGNOSIS — I712 Thoracic aortic aneurysm, without rupture: Secondary | ICD-10-CM | POA: Insufficient documentation

## 2019-11-21 DIAGNOSIS — R933 Abnormal findings on diagnostic imaging of other parts of digestive tract: Secondary | ICD-10-CM | POA: Diagnosis not present

## 2019-11-21 DIAGNOSIS — K4090 Unilateral inguinal hernia, without obstruction or gangrene, not specified as recurrent: Secondary | ICD-10-CM | POA: Diagnosis not present

## 2019-11-21 DIAGNOSIS — G620 Drug-induced polyneuropathy: Secondary | ICD-10-CM

## 2019-11-21 DIAGNOSIS — T451X5A Adverse effect of antineoplastic and immunosuppressive drugs, initial encounter: Secondary | ICD-10-CM | POA: Diagnosis not present

## 2019-11-21 DIAGNOSIS — I700 Atherosclerosis of aorta: Secondary | ICD-10-CM | POA: Insufficient documentation

## 2019-11-21 DIAGNOSIS — M899 Disorder of bone, unspecified: Secondary | ICD-10-CM | POA: Diagnosis not present

## 2019-11-21 DIAGNOSIS — N400 Enlarged prostate without lower urinary tract symptoms: Secondary | ICD-10-CM | POA: Insufficient documentation

## 2019-11-21 DIAGNOSIS — Z79899 Other long term (current) drug therapy: Secondary | ICD-10-CM | POA: Diagnosis not present

## 2019-11-21 DIAGNOSIS — C9000 Multiple myeloma not having achieved remission: Secondary | ICD-10-CM | POA: Insufficient documentation

## 2019-11-21 DIAGNOSIS — C7951 Secondary malignant neoplasm of bone: Secondary | ICD-10-CM

## 2019-11-21 LAB — GLUCOSE, CAPILLARY: Glucose-Capillary: 92 mg/dL (ref 70–99)

## 2019-11-21 MED ORDER — FLUDEOXYGLUCOSE F - 18 (FDG) INJECTION
7.8400 | Freq: Once | INTRAVENOUS | Status: AC | PRN
  Administered 2019-11-21: 7.84 via INTRAVENOUS

## 2019-11-21 NOTE — Procedures (Signed)
[facility] Neu[facility]  [place], [place]  [place], [place] [address] Tel: [phone] Fax:  [phone] Test Date:  11/21/2019  Patient: [name] [name] DOB: 1941-05-26 Physician: Narda Amber, DO  Sex: Male Height: 5\' 11"  Ref Phys: Narda Amber, DO  ID#: [id number] Temp: 32.0C Technician:    Patient Complaints: This is a 79 year old man with plasma cell myeloma who developed numbness in the feet and imbalance following chemotherapy.  NCV & EMG Findings: Extensive electrodiagnostic testing of the right lower extremity and additional studies of the left shows:  1. Bilateral sural and superficial peroneal sensory responses are absent. 2. Bilateral peroneal motor responses at the extensor digitorum brevis are absent, and reduced at the tibialis anterior (R1.9, L1.7 mV).  Bilateral tibial motor responses show reduced amplitude (R2.0, L1.7 mV) and decreased conduction velocity (Knee-Ankle, L36, R37 m/s).   3. Tibial H reflex is prolonged on the right and absent on the left. 4. Chronic motor axonal loss changes are seen affecting bilateral tibialis anterior, medial gastrocnemius, and right rectus femoris muscles, without accompanied active denervation.  Proximal and deep muscles were not tested as the patient is on anticoagulation therapy.  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   ___________________________ Narda Amber, DO    Nerve Conduction Studies Anti Sensory Summary Table   Stim Site NR Peak (ms) Norm Peak (ms) P-T Amp (V) Norm P-T Amp  Left Sup Peroneal Anti Sensory (Ant Lat Mall)  32C  12 cm NR  <4.6  >3  Right Sup Peroneal Anti Sensory (Ant Lat Mall)  32C  12 cm NR  <4.6  >3  Left Sural Anti Sensory (Lat Mall)  32C  Calf NR  <4.6  >3  Right Sural Anti Sensory (Lat Mall)  32C  Calf NR  <4.6  >3   Motor Summary Table   Stim Site  NR Onset (ms) Norm Onset (ms) O-P Amp (mV) Norm O-P Amp Site1 Site2 Delta-0 (ms) Dist (cm) Vel (m/s) Norm Vel (m/s)  Left Peroneal Motor (Ext Dig Brev)  32C  Ankle NR  <6.0  >2.5 B Fib Ankle  0.0  >40  B Fib NR     Poplt B Fib  0.0  >40  Poplt NR            Right Peroneal Motor (Ext Dig Brev)  32C  Ankle NR  <6.0  >2.5 B Fib Ankle  0.0  >40  B Fib NR     Poplt B Fib  0.0  >40  Poplt NR            Left Peroneal TA Motor (Tib Ant)  32C  Fib Head    4.1 <4.5 1.7 >3 Poplit Fib Head 2.4 10.0 42 >40  Poplit    6.5  1.3         Right Peroneal TA Motor (Tib Ant)  32C  Fib Head    4.1 <4.5 1.9 >3 Poplit Fib Head 2.0 10.0 50 >40  Poplit    6.1  1.4         Left Tibial Motor (Abd Hall Brev)  32C  Ankle    4.5 <6.0 1.7 >4 Knee Ankle 11.5 41.0 36 >40  Knee    16.0  1.1         Right Tibial Motor (Abd Hall Brev)  32C  Ankle    4.5 <6.0 2.0 >4 Knee Ankle 11.8 44.0  37 >40  Knee    16.3  1.2          H Reflex Studies   NR H-Lat (ms) Lat Norm (ms) L-R H-Lat (ms)  Left Tibial (Gastroc)  32C  NR  <35   Right Tibial (Gastroc)  32C     43.27 <35    EMG   Side Muscle Ins Act Fibs Psw Fasc Number Recrt Dur Dur. Amp Amp. Poly Poly. Comment  Right AntTibialis Nml Nml Nml Nml 3- Rapid All 1+ All 1+ All 1+ N/A  Right Gastroc Nml Nml Nml Nml 2- Rapid Most 1+ Most 1+ Most 1+ N/A  Right RectFemoris Nml Nml Nml Nml 1- Rapid Few 1+ Few 1+ Few 1+ N/A  Right BicepsFemS Nml Nml Nml Nml Nml Nml Nml Nml Nml Nml Nml Nml N/A  Left AntTibialis Nml Nml Nml Nml Nml Nml Nml Nml Nml Nml Nml Nml N/A  Left BicepsFemS Nml Nml Nml Nml 3- Rapid Most 1+ Most 1+ Most 1+ N/A  Left Gastroc Nml Nml Nml Nml 2- Rapid Most 1+ Most 1+ Most 1+ N/A  Left RectFemoris Nml Nml Nml Nml Nml Nml Nml Nml Nml Nml Nml Nml N/A      Waveforms:

## 2019-11-22 ENCOUNTER — Encounter (HOSPITAL_COMMUNITY): Payer: Self-pay

## 2019-11-22 ENCOUNTER — Ambulatory Visit (HOSPITAL_COMMUNITY)
Admission: RE | Admit: 2019-11-22 | Discharge: 2019-11-22 | Disposition: A | Discharge status: Home / Self Care | Payer: Medicare Other | Source: Ambulatory Visit | Attending: Hematology | Admitting: Hematology

## 2019-11-22 DIAGNOSIS — C9000 Multiple myeloma not having achieved remission: Secondary | ICD-10-CM | POA: Diagnosis not present

## 2019-11-22 DIAGNOSIS — E7800 Pure hypercholesterolemia, unspecified: Secondary | ICD-10-CM | POA: Diagnosis not present

## 2019-11-22 DIAGNOSIS — K760 Fatty (change of) liver, not elsewhere classified: Secondary | ICD-10-CM | POA: Insufficient documentation

## 2019-11-22 DIAGNOSIS — M1990 Unspecified osteoarthritis, unspecified site: Secondary | ICD-10-CM | POA: Diagnosis not present

## 2019-11-22 DIAGNOSIS — Z79899 Other long term (current) drug therapy: Secondary | ICD-10-CM | POA: Insufficient documentation

## 2019-11-22 DIAGNOSIS — I712 Thoracic aortic aneurysm, without rupture: Secondary | ICD-10-CM | POA: Diagnosis not present

## 2019-11-22 DIAGNOSIS — D6489 Other specified anemias: Secondary | ICD-10-CM | POA: Diagnosis not present

## 2019-11-22 DIAGNOSIS — D649 Anemia, unspecified: Secondary | ICD-10-CM | POA: Insufficient documentation

## 2019-11-22 DIAGNOSIS — Z952 Presence of prosthetic heart valve: Secondary | ICD-10-CM | POA: Insufficient documentation

## 2019-11-22 DIAGNOSIS — I480 Paroxysmal atrial fibrillation: Secondary | ICD-10-CM | POA: Diagnosis not present

## 2019-11-22 DIAGNOSIS — Z8579 Personal history of other malignant neoplasms of lymphoid, hematopoietic and related tissues: Secondary | ICD-10-CM | POA: Diagnosis not present

## 2019-11-22 DIAGNOSIS — Z7901 Long term (current) use of anticoagulants: Secondary | ICD-10-CM | POA: Insufficient documentation

## 2019-11-22 DIAGNOSIS — E785 Hyperlipidemia, unspecified: Secondary | ICD-10-CM | POA: Insufficient documentation

## 2019-11-22 DIAGNOSIS — I5032 Chronic diastolic (congestive) heart failure: Secondary | ICD-10-CM | POA: Insufficient documentation

## 2019-11-22 DIAGNOSIS — I110 Hypertensive heart disease with heart failure: Secondary | ICD-10-CM | POA: Insufficient documentation

## 2019-11-22 DIAGNOSIS — D72828 Other elevated white blood cell count: Secondary | ICD-10-CM | POA: Diagnosis not present

## 2019-11-22 DIAGNOSIS — C7951 Secondary malignant neoplasm of bone: Secondary | ICD-10-CM | POA: Diagnosis not present

## 2019-11-22 LAB — CBC WITH DIFFERENTIAL/PLATELET
Abs Immature Granulocytes: 0.1 10*3/uL — ABNORMAL HIGH (ref 0.00–0.07)
Basophils Absolute: 0 10*3/uL (ref 0.0–0.1)
Basophils Relative: 0 %
Eosinophils Absolute: 0.1 10*3/uL (ref 0.0–0.5)
Eosinophils Relative: 1 %
HCT: 35.7 % — ABNORMAL LOW (ref 39.0–52.0)
Hemoglobin: 11.4 g/dL — ABNORMAL LOW (ref 13.0–17.0)
Immature Granulocytes: 1 %
Lymphocytes Relative: 16 %
Lymphs Abs: 1.7 10*3/uL (ref 0.7–4.0)
MCH: 30.7 pg (ref 26.0–34.0)
MCHC: 31.9 g/dL (ref 30.0–36.0)
MCV: 96.2 fL (ref 80.0–100.0)
Monocytes Absolute: 1 10*3/uL (ref 0.1–1.0)
Monocytes Relative: 9 %
Neutro Abs: 8.3 10*3/uL — ABNORMAL HIGH (ref 1.7–7.7)
Neutrophils Relative %: 73 %
Platelets: 153 10*3/uL (ref 150–400)
RBC: 3.71 MIL/uL — ABNORMAL LOW (ref 4.22–5.81)
RDW: 14.9 % (ref 11.5–15.5)
WBC: 11.2 10*3/uL — ABNORMAL HIGH (ref 4.0–10.5)
nRBC: 0 % (ref 0.0–0.2)

## 2019-11-22 LAB — BASIC METABOLIC PANEL
Anion gap: 9 (ref 5–15)
BUN: 32 mg/dL — ABNORMAL HIGH (ref 8–23)
CO2: 26 mmol/L (ref 22–32)
Calcium: 8.9 mg/dL (ref 8.9–10.3)
Chloride: 104 mmol/L (ref 98–111)
Creatinine, Ser: 0.99 mg/dL (ref 0.61–1.24)
GFR calc Af Amer: >60 mL/min (ref >60)
GFR calc non Af Amer: >60 mL/min (ref >60)
Glucose, Bld: 93 mg/dL (ref 70–99)
Potassium: 4.3 mmol/L (ref 3.5–5.1)
Sodium: 139 mmol/L (ref 135–145)

## 2019-11-22 MED ORDER — MIDAZOLAM HCL 2 MG/2ML IJ SOLN
INTRAMUSCULAR | Status: AC
  Filled 2019-11-22: qty 4

## 2019-11-22 MED ORDER — SODIUM CHLORIDE 0.9 % IV SOLN: INTRAVENOUS | Status: DC

## 2019-11-22 MED ORDER — FENTANYL CITRATE (PF) 100 MCG/2ML IJ SOLN
INTRAMUSCULAR | Status: AC
  Filled 2019-11-22: qty 2

## 2019-11-22 MED ORDER — MIDAZOLAM HCL 2 MG/2ML IJ SOLN
INTRAMUSCULAR | Status: AC | PRN
  Administered 2019-11-22 (×2): 1 mg via INTRAVENOUS

## 2019-11-22 MED ORDER — FENTANYL CITRATE (PF) 100 MCG/2ML IJ SOLN
INTRAMUSCULAR | Status: AC | PRN
  Administered 2019-11-22 (×2): 50 ug via INTRAVENOUS

## 2019-11-22 MED ORDER — LIDOCAINE HCL (PF) 1 % IJ SOLN
INTRAMUSCULAR | Status: AC | PRN
  Administered 2019-11-22: 10 mL

## 2019-11-22 NOTE — H&P (Signed)
Chief Complaint: Patient was seen in consultation today for multiple myeloma/bone marrow biopsy and aspiration.  Referring Physician(s): Brunetta Genera  Supervising Physician: Aletta Edouard  Patient Status: [facility] - Out-pt  History of Present Illness: [name] [name] is a 79 y.o. male with a past medical history of hypertension, hyperlipidemia, paroxysmal atrial fibrillation on chronic anticoagulation with Eliquis, bicuspid aortic valve s/p heart valve replacement, HF, GI bleed, tubular adenoma of colon, hemorrhoids, fatty liver disease, anemia, multiple myeloma, OA, and low back pain. He was unfortunately diagnosed with multiple myeloma in 04/2019. His cancer is managed by Dr. Irene Limbo. He has completed a course of systemic chemotherapy for management.  IR consulted by Dr. Irene Limbo for possible image-guided bone marrow biopsy/aspiration to assess response to systemic chemotherapy treatment. Patient awake and alert laying in bed with no complaints at this time. Denies fever, chills, chest pain, dyspnea, abdominal pain, or headache.   Past Medical History:  Diagnosis Date  . Ascending aortic aneurysm ([place])   . Bicuspid aortic valve   . Cancer ([place])   . CHF NYHA class I (no symptoms from ordinary activities), acute, diastolic ([place])   . Fatty liver    mild  . GI bleeding 07/21/2018   post polypectomy  . Hemorrhoids   . HTN (hypertension)   . Hypercholesteremia   . Hypokalemia   . Internal hemorrhoids   . LBP (low back pain)   . Moderate aortic stenosis   . Osteoarthritis   . Paroxysmal atrial fibrillation ([place])    a. new onset Afib in 07/2008. He underwent ibutilide cardioversion successfully. b. Recurrence 01/2013 s/p TEE/DCCV - was on Xarelto but he stopped it as he was convinced it was causing joint pn. c. Recurrence 01/2016 - spont conv to NSR. Pt took Eliquis x1 mo then declined further anticoag. d. Recurrence 07/2016.  [name] Pneumonia   . Tubular adenoma of colon     Past  Surgical History:  Procedure Laterality Date  . BACK SURGERY  x12 years ago  . CARDIOVERSION N/A 01/26/2013   Procedure: CARDIOVERSION;  Surgeon: Larey Dresser, MD;  Location: [facility] ENDOSCOPY;  Service: Cardiovascular;  Laterality: N/A;  . CARDIOVERSION N/A 10/28/2017   Procedure: CARDIOVERSION;  Surgeon: Larey Dresser, MD;  Location: [facility] ENDOSCOPY;  Service: Cardiovascular;  Laterality: N/A;  . CARDIOVERSION N/A 03/03/2018   Procedure: CARDIOVERSION;  Surgeon: Lelon Perla, MD;  Location: [facility] ENDOSCOPY;  Service: Cardiovascular;  Laterality: N/A;  . CARDIOVERSION N/A 09/19/2019   Procedure: CARDIOVERSION;  Surgeon: Larey Dresser, MD;  Location: [facility] ENDOSCOPY;  Service: Cardiovascular;  Laterality: N/A;  . COLONOSCOPY    . COLONOSCOPY  07/17/2018   at [facility]  . HEMORRHOID SURGERY    . LUMBAR LAMINECTOMY    . POLYPECTOMY    . RIGHT HEART CATH N/A 08/20/2019   Procedure: RIGHT HEART CATH;  Surgeon: Larey Dresser, MD;  Location: [place] CV LAB;  Service: Cardiovascular;  Laterality: N/A;  . RIGHT/LEFT HEART CATH AND CORONARY ANGIOGRAPHY N/A 03/07/2019   Procedure: RIGHT/LEFT HEART CATH AND CORONARY ANGIOGRAPHY;  Surgeon: Burnell Blanks, MD;  Location: [place] CV LAB;  Service: Cardiovascular;  Laterality: N/A;  . [name]  04/2019  . TEE WITHOUT CARDIOVERSION N/A 01/26/2013   Procedure: TRANSESOPHAGEAL ECHOCARDIOGRAM (TEE);  Surgeon: Larey Dresser, MD;  Location: [place];  Service: Cardiovascular;  Laterality: N/A;  . TEE WITHOUT CARDIOVERSION N/A 10/28/2017   Procedure: TRANSESOPHAGEAL ECHOCARDIOGRAM (TEE);  Surgeon: Larey Dresser, MD;  Location: [facility] ENDOSCOPY;  Service: Cardiovascular;  Laterality: N/A;  . TEE WITHOUT CARDIOVERSION N/A 05/08/2019   Procedure: TRANSESOPHAGEAL ECHOCARDIOGRAM (TEE);  Surgeon: Burnell Blanks, MD;  Location: [place] CV LAB;  Service: Open Heart Surgery;  Laterality: N/A;  . TEE WITHOUT CARDIOVERSION N/A 09/19/2019   Procedure:  TRANSESOPHAGEAL ECHOCARDIOGRAM (TEE);  Surgeon: Larey Dresser, MD;  Location: [facility] ENDOSCOPY;  Service: Cardiovascular;  Laterality: N/A;  . TRANSCATHETER AORTIC VALVE REPLACEMENT, TRANSFEMORAL N/A 05/08/2019   Procedure: TRANSCATHETER AORTIC VALVE REPLACEMENT, TRANSFEMORAL;  Surgeon: Burnell Blanks, MD;  Location: [place] CV LAB;  Service: Open Heart Surgery;  Laterality: N/A;    Allergies: Xarelto [rivaroxaban], Ambien [zolpidem], Corticosteroids, Ramipril, and Benazepril  Medications: Prior to Admission medications   Medication Sig Start Date End Date Taking? Authorizing Provider  acyclovir (ZOVIRAX) 400 MG tablet Take 1 tablet (400 mg total) by mouth 2 (two) times daily. 08/15/19  Yes Brunetta Genera, MD  Amino Acids-Protein Hydrolys (PRO-STAT 101) LIQD Take 30 mLs by mouth daily. 10/08/19  Yes [name], [name], MD  amiodarone (PACERONE) 200 MG tablet Take 1 tablet (200 mg total) by mouth daily. 10/16/19  Yes [name], [name], MD  amLODipine (NORVASC) 5 MG tablet Take 1 tablet (5 mg total) by mouth daily. 09/21/19 09/15/20 Yes Charlynne Cousins, MD  amoxicillin-clavulanate (AUGMENTIN) 875-125 MG tablet Take 1 tablet by mouth 2 (two) times daily. 11/07/19  Yes Burns, Claudina Lick, MD  b complex vitamins tablet Take 1 tablet by mouth daily. 09/25/19  Yes [name], [name], MD  Calcium-Magnesium 500-250 MG TABS Take 1 tablet by mouth daily.   Yes [provider]  Cholecalciferol (VITAMIN D) 50 MCG (2000 UT) tablet Take 2,000 Units by mouth daily.   Yes [provider]  dexamethasone (DECADRON) 4 MG tablet Take 3 tablets (12 mg total) by mouth once a week. 10/16/19  Yes [name], [name], MD  DULoxetine (CYMBALTA) 60 MG capsule TAKE 1 CAPSULE BY MOUTH EVERY DAY 11/08/19  Yes Brunetta Genera, MD  ergocalciferol (VITAMIN D2) 1.25 MG (50000 UT) capsule Take 50,000 Units by mouth once a week. Every Monday   Yes [provider]  famotidine (PEPCID) 40  MG tablet Take 1 tablet (40 mg total) by mouth daily. 01/29/19  Yes [name], [name], MD  furosemide (LASIX) 20 MG tablet Take 20 mg by mouth every other day.   Yes [provider]  gabapentin (NEURONTIN) 300 MG capsule Take 1 capsule (300 mg total) by mouth 3 (three) times daily. 10/02/19  Yes Barton Dubois, MD  loratadine (CLARITIN) 10 MG tablet Take 1 tablet (10 mg total) by mouth daily. 12/11/18  Yes [name], [name], MD  metoprolol succinate (TOPROL-XL) 25 MG 24 hr tablet Take 1 tablet (25 mg total) by mouth 2 (two) times daily. 09/21/19  Yes Charlynne Cousins, MD  senna-docusate (SENOKOT-S) 8.6-50 MG tablet Take 2 tablets by mouth at bedtime.   Yes [provider]  spironolactone (ALDACTONE) 25 MG tablet Take 1 tablet (25 mg total) by mouth daily. 09/21/19  Yes Charlynne Cousins, MD  apixaban (ELIQUIS) 5 MG TABS tablet Take 5 mg by mouth 2 (two) times daily.    [provider]  Carboxymethylcellul-Glycerin (LUBRICATING EYE DROPS OP) Place 1 drop into both eyes daily as needed (dry eyes).    [provider]  diclofenac Sodium (VOLTAREN) 1 % GEL Apply 1 application topically 4 (four) times daily as needed (pain).    [provider]  fentaNYL ([place])  12 MCG/HR Place 1 patch onto the skin every 3 (three) days. 08/15/19   Brunetta Genera, MD  LORazepam (ATIVAN) 0.5 MG tablet Take 1 tablet (0.5 mg total) by mouth every 8 (eight) hours as needed (Nausea or vomiting). 10/16/19   [name], [name], MD  ondansetron (ZOFRAN) 8 MG tablet Take 1 tablet (8 mg total) by mouth 2 (two) times daily as needed (Nausea or vomiting). 05/17/19   Brunetta Genera, MD  oxyCODONE (OXY IR/ROXICODONE) 5 MG immediate release tablet Take 1-2 tablets (5-10 mg total) by mouth every 4 (four) hours as needed for moderate pain or severe pain. 10/03/19   Brunetta Genera, MD  polyethylene glycol powder [facility]) 17 GM/SCOOP powder Take 17-34 g by mouth 2  (two) times daily as needed for moderate constipation. 09/25/19   [name], [name], MD  prochlorperazine (COMPAZINE) 10 MG tablet Take 1 tablet (10 mg total) by mouth every 6 (six) hours as needed (Nausea or vomiting). 05/17/19   Brunetta Genera, MD     Family History  Problem Relation Age of Onset  . Colon cancer Mother 67  . Hypertension Other   . Coronary artery disease Neg Hx   . Colon polyps Neg Hx   . Esophageal cancer Neg Hx   . Rectal cancer Neg Hx   . Stomach cancer Neg Hx     Social History   Socioeconomic History  . Marital status: Married    Spouse name: Not on file  . Number of children: 0  . Years of education: Not on file  . Highest education level: Not on file  Occupational History  . Occupation: Retired Lobbyist: [employer]  Tobacco Use  . Smoking status: Never Smoker  . Smokeless tobacco: Never Used  Substance and Sexual Activity  . Alcohol use: Yes    Comment: Drinks 1 glass of wine nightly/socially  . Drug use: No  . Sexual activity: Yes  Other Topics Concern  . Not on file  Social History Narrative   Patient lives in [place] w/ his wife. He is a native of [place]. 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   Right-handed   Caffeine: 2 cups coffee per day   Two story home   Social Determinants of Health   Financial Resource Strain:   . Difficulty of Paying Living Expenses:   Food Insecurity:   . Worried About Charity fundraiser in the Last Year:   . Arboriculturist in the Last Year:   Transportation Needs:   . Film/video editor (Medical):   [name] Lack of Transportation (Non-Medical):   Physical Activity:   . Days of Exercise per Week:   . Minutes of Exercise per Session:   Stress:   . Feeling of Stress :   Social Connections:   . Frequency of Communication with Friends and Family:   . Frequency of Social Gatherings with Friends and Family:   . Attends Religious Services:    . Active Member of Clubs or Organizations:   . Attends Archivist Meetings:   [name] Marital Status:      Review of Systems: A 12 point ROS discussed and pertinent positives are indicated in the HPI above.  All other systems are negative.  Review of Systems  Constitutional: Negative for chills and fever.  Respiratory: Negative for shortness of breath and wheezing.   Cardiovascular: Negative for chest  pain and palpitations.  Gastrointestinal: Negative for abdominal pain.  Neurological: Negative for headaches.  Psychiatric/Behavioral: Negative for behavioral problems and confusion.    Vital Signs: There were no vitals taken for this visit.  Physical Exam Vitals and nursing note reviewed.  Constitutional:      General: He is not in acute distress.    Appearance: Normal appearance.  Cardiovascular:     Rate and Rhythm: Normal rate and regular rhythm.     Heart sounds: Normal heart sounds. No murmur.  Pulmonary:     Effort: Pulmonary effort is normal. No respiratory distress.     Breath sounds: Normal breath sounds. No wheezing.  Skin:    General: Skin is warm and dry.  Neurological:     Mental Status: He is alert and oriented to person, place, and time.  Psychiatric:        Mood and Affect: Mood normal.        Behavior: Behavior normal.      MD Evaluation Airway: WNL Heart: WNL Abdomen: WNL Chest/ Lungs: WNL ASA  Classification: 3 Mallampati/Airway Score: Two   Imaging: NM PET Image Restage (PS) Whole Body  Result Date: 11/21/2019 CLINICAL DATA:  Subsequent treatment strategy for multiple myeloma. EXAM: NUCLEAR MEDICINE PET WHOLE BODY TECHNIQUE: 7.8 mCi F-18 FDG was injected intravenously. Full-ring PET imaging was performed from the skull base to thigh after the radiotracer. CT data was obtained and used for attenuation correction and anatomic localization. Fasting blood glucose: 92 mg/dl COMPARISON:  04/06/2019 and CT abdomen from 09/14/2019 and CT chest  from 09/30/2019 FINDINGS: Mediastinal blood pool activity: SUV max 2.2 HEAD/NECK: No significant abnormal hypermetabolic activity in this region. Incidental CT findings: Bilateral common carotid atherosclerotic calcification. CHEST: No significant abnormal hypermetabolic activity in this region. Incidental CT findings: Aortic atherosclerosis. Aortic valve replacement. Ascending thoracic aortic aneurysm 4.3 cm in diameter. Mild scarring or atelectasis in the right middle lobe and lingula. ABDOMEN/PELVIS: Focal thick-walled enrolling in the ascending colon with mildly accentuated activity with maximum SUV 4.2, not readily apparent on the CT of 09/14/2019 or on the prior PET-CT, probably from peristaltic activity or focal contraction rather than malignancy, but correlation with patient's colon screening history would be recommended. Incidental CT findings: Prominent stool throughout the colon favors constipation. Photopenic left mid kidney cyst posteriorly. Aortoiliac atherosclerotic vascular disease. Prominent prostate gland indents the urinary bladder. Direct left inguinal hernia contains a small part of the sigmoid colon, without strangulation or obstruction. SKELETON: Near-total resolution of the previous multifocal hypermetabolic bony lesions. One of the dominant destructive lesions was previously in the right posterior acetabulum, maximum SUV in this vicinity is currently 2.2, previously 11.9. This was previously a purely lytic lesion with soft tissue component, there is been significant new calcification in this vicinity and the extraosseous component is no longer present. The large anterior lucent acetabular and iliac bone lesion with ground-glass matrix has maximum SUV of 2.4 (previously 2.9). Incidental CT findings: Callus formation from prior right anterior rib fractures, without significant hypermetabolic activity. Prior anterior wedge compression fracture at T11. Vertical sclerosis in both sacral ala  probably from remote prior sacral insufficiency fracture. EXTREMITIES: No significant abnormal hypermetabolic activity in this region. Incidental CT findings: Soft tissue swelling along the dorsum of the feet, nonspecific. Subcutaneous edema along the ankles. IMPRESSION: 1. Near-total resolution of previous multifocal hypermetabolic bony lesions. The dominant right posterior acetabular lesion has reduced in maximum SUV from 11.9 down to 2.2 today, with some increase in internal calcifications/sclerosis  indicating healing response. 2. Sclerotic and lucent residua of prior myelomatous lesions. 3. No new lesions or hypermetabolic soft tissue lesions are observed. 4. Low-grade activity associated with a focal narrowing in the ascending colon, probably from peristaltic activity given the lack of similar findings on the recent prior CT or on prior PET-CT, but correlation with colon cancer screening history is recommended and if not up-to-date, standard colon cancer screening would be recommended. 5. Other imaging findings of potential clinical significance: Aortic Atherosclerosis (ICD10-I70.0). Stable ascending thoracic aortic aneurysm at 4.3 cm in diameter. Subcutaneous edema along the ankles and feet. Prominent stool throughout the colon favors constipation. Hernia Prostatomegaly. Direct left inguinal contains part of the sigmoid colon without strangulation or obstruction. Electronically Signed   By: Van Clines M.D.   On: 11/21/2019 15:37   NCV with EMG(electromyography)  Result Date: 11/21/2019 Alda Berthold, DO     11/21/2019 12:45 PM [facility] [place], [address]  [place], [place] [address] Tel: [phone] Fax:  [phone] Test Date:  11/21/2019 Patient: [name] [name] DOB: 07-26-1941 Physician: Narda Amber, DO Sex: Male Height: 5' 11" Ref Phys: Narda Amber, DO ID#: [id number] Temp: 32.0C Technician:  Patient Complaints: This is a 79 year old man with plasma cell myeloma who  developed numbness in the feet and imbalance following chemotherapy. NCV & EMG Findings: Extensive electrodiagnostic testing of the right lower extremity and additional studies of the left shows: 1. Bilateral sural and superficial peroneal sensory responses are absent. 2. Bilateral peroneal motor responses at the extensor digitorum brevis are absent, and reduced at the tibialis anterior (R1.9, L1.7 mV).  Bilateral tibial motor responses show reduced amplitude (R2.0, L1.7 mV) and decreased conduction velocity (Knee-Ankle, L36, R37 m/s).  3. Tibial H reflex is prolonged on the right and absent on the left. 4. Chronic motor axonal loss changes are seen affecting bilateral tibialis anterior, medial gastrocnemius, and right rectus femoris muscles, without accompanied active denervation.  Proximal and deep muscles were not tested as the patient is on anticoagulation therapy. 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 ___________________________ Narda Amber, DO Nerve Conduction Studies Anti Sensory Summary Table  Stim Site NR Peak (ms) Norm Peak (ms) P-T Amp (V) Norm P-T Amp Left Sup Peroneal Anti Sensory (Ant Lat Mall)  32C 12 cm NR  <4.6  >3 Right Sup Peroneal Anti Sensory (Ant Lat Mall)  32C 12 cm NR  <4.6  >3 Left Sural Anti Sensory (Lat Mall)  32C Calf NR  <4.6  >3 Right Sural Anti Sensory (Lat Mall)  32C Calf NR  <4.6  >3 Motor Summary Table  Stim Site NR Onset (ms) Norm Onset (ms) O-P Amp (mV) Norm O-P Amp Site1 Site2 Delta-0 (ms) Dist (cm) Vel (m/s) Norm Vel (m/s) Left Peroneal Motor (Ext Dig Brev)  32C Ankle NR  <6.0  >2.5 B Fib Ankle  0.0  >40 B Fib NR     Poplt B Fib  0.0  >40 Poplt NR           Right Peroneal Motor (Ext Dig Brev)  32C Ankle NR  <6.0  >2.5 B Fib Ankle  0.0  >40 B Fib NR     Poplt B Fib  0.0  >40 Poplt NR           Left Peroneal TA Motor (Tib Ant)  32C Fib Head     4.1 <4.5 1.7 >3 Poplit Fib  Head 2.4 10.0 42 >40 Poplit    6.5  1.3        Right Peroneal TA Motor (Tib Ant)  32C Fib Head    4.1 <4.5 1.9 >3 Poplit Fib Head 2.0 10.0 50 >40 Poplit    6.1  1.4        Left Tibial Motor (Abd Hall Brev)  32C Ankle    4.5 <6.0 1.7 >4 Knee Ankle 11.5 41.0 36 >40 Knee    16.0  1.1        Right Tibial Motor (Abd Hall Brev)  32C Ankle    4.5 <6.0 2.0 >4 Knee Ankle 11.8 44.0 37 >40 Knee    16.3  1.2        H Reflex Studies  NR H-Lat (ms) Lat Norm (ms) L-R H-Lat (ms) Left Tibial (Gastroc)  32C NR  <35  Right Tibial (Gastroc)  32C    43.27 <35  EMG  Side Muscle Ins Act Fibs Psw Fasc Number Recrt Dur Dur. Amp Amp. Poly Poly. Comment Right AntTibialis Nml Nml Nml Nml 3- Rapid All 1+ All 1+ All 1+ N/A Right Gastroc Nml Nml Nml Nml 2- Rapid Most 1+ Most 1+ Most 1+ N/A Right RectFemoris Nml Nml Nml Nml 1- Rapid Few 1+ Few 1+ Few 1+ N/A Right BicepsFemS _0  _1  Nml Nml N/A Left AntTibialis _2  _3  Nml Nml N/A Left BicepsFemS Nml Nml Nml Nml 3- Rapid Most 1+ Most 1+ Most 1+ N/A Left Gastroc Nml Nml Nml Nml 2- Rapid Most 1+ Most 1+ Most 1+ N/A Left RectFemoris _4  _5  Nml Nml N/A Waveforms:                Labs:  CBC: Recent Labs    10/01/19 0403 10/02/19 0537 10/11/19 0758 11/22/19 0740  WBC 7.2 5.3 12.3* 11.2*  HGB 11.4* 11.2* 11.8* 11.4*  HCT 35.9* 35.5* 36.6* 35.7*  PLT 128* 136* 187 153    COAGS: Recent Labs    05/04/19 1620 05/04/19 1620 05/24/19 0815 08/16/19 1421 09/14/19 1027 09/19/19 0458  INR 1.0   < > 1.1 1.2 1.5* 1.3*  APTT 29  --  27  --   --   --    < > = values in this interval not displayed.    BMP: Recent Labs    10/02/19 0537 10/11/19 0758 10/23/19 0952 11/22/19 0740  NA 140 139 138 139  K 3.7 4.8 4.8 4.3  CL 101 104 101 104  CO2 _6 GLUCOSE 99 97 123* 93  BUN 10 30* 24* 32*  CALCIUM 8.3* 8.9 9.5 8.9  CREATININE 1.08 1.42* 1.38*  0.99  GFRNONAA >60 47* 48* >60  GFRAA >60 54* 56* >60    LIVER FUNCTION TESTS: Recent Labs    09/13/19 2325 09/13/19 2325 09/16/19 0845 10/02/19 0537 10/11/19 0758 10/23/19 0952  BILITOT 1.6*  --  0.7  --  0.4 0.6  AST 39  --  30  --  30 37  ALT 29  --  25  --  60* 82*  ALKPHOS 126  --  95  --  60 56  PROT 7.4  --  4.3*  --  6.1* 5.9*  ALBUMIN 5.2*   < > 2.7* 2.9* 4.2 4.0   < > = values in this interval not displayed.  Assessment and Plan:  Multiple myeloma. Plan for image-guided bone marrow biopsy/aspiration today in IR. Patient is NPO. Afebrile. Ok to proceed with Eliquis use per IR protocol.  Risks and benefits discussed with the patient including, but not limited to bleeding, infection, damage to adjacent structures or low yield requiring additional tests. All of the patient's questions were answered, patient is agreeable to proceed. Consent signed and in chart.   Thank you for this interesting consult.  I greatly enjoyed meeting Ines Levett and look forward to participating in their care.  A copy of this report was sent to the requesting provider on this date.  Electronically Signed: Earley Abide, PA-C 11/22/2019, 8:16 AM   I spent a total of 25 Minutes in face to face in clinical consultation, greater than 50% of which was counseling/coordinating care for multiple myeloma/bone marrow biopsy and aspiration.

## 2019-11-22 NOTE — Discharge Instructions (Signed)
Needle Biopsy, Care After These instructions tell you how to care for yourself after your procedure. Your doctor may also give you more specific instructions. Call your doctor if you have any problems or questions. What can I expect after the procedure? After the procedure, it is common to have:  Soreness.  Bruising.  Mild pain. Follow these instructions at home:   Return to your normal activities as told by your doctor. Ask your doctor what activities are safe for you.  Take over-the-counter and prescription medicines only as told by your doctor.  Wash your hands with soap and water before you change your bandage (dressing). If you cannot use soap and water, use hand sanitizer.  Follow instructions from your doctor about: ? How to take care of your puncture site. ? When and how to change your bandage. ? When to remove your bandage.  Check your puncture site every day for signs of infection. Watch for: ? Redness, swelling, or pain. ? Fluid or blood. ? Pus or a bad smell. ? Warmth.  Do not take baths, swim, or use a hot tub until your doctor approves. You may shower tomorrow.  Keep all follow-up visits as told by your doctor. This is important. Contact a doctor if you have:  A fever.  Redness, swelling, or pain at the puncture site, and it lasts longer than a few days.  Fluid, blood, or pus coming from the puncture site.  Warmth coming from the puncture site. Get help right away if:  You have a lot of bleeding from the puncture site. Summary  After the procedure, it is common to have soreness, bruising, or mild pain at the puncture site.  Check your puncture site every day for signs of infection, such as redness, swelling, or pain.  Get help right away if you have severe bleeding from your puncture site. This information is not intended to replace advice given to you by your health care provider. Make sure you discuss any questions you have with your health care  provider. Document Revised: 09/12/2017 Document Reviewed: 09/12/2017 Elsevier Patient Education  [place].      Moderate Conscious Sedation, Adult, Care After These instructions provide you with information about caring for yourself after your procedure. Your health care provider may also give you more specific instructions. Your treatment has been planned according to current medical practices, but problems sometimes occur. Call your health care provider if you have any problems or questions after your procedure. What can I expect after the procedure? After your procedure, it is common:  To feel sleepy for several hours.  To feel clumsy and have poor balance for several hours.  To have poor judgment for several hours.  To vomit if you eat too soon. Follow these instructions at home: For at least 24 hours after the procedure:   Do not: ? Participate in activities where you could fall or become injured. ? Drive. ? Use heavy machinery. ? Drink alcohol. ? Take sleeping pills or medicines that cause drowsiness. ? Make important decisions or sign legal documents. ? Take care of children on your own.  Rest. Eating and drinking  Follow the diet recommended by your health care provider.  If you vomit: ? Drink water, juice, or soup when you can drink without vomiting. ? Make sure you have little or no nausea before eating solid foods. General instructions  Have a responsible adult stay with you until you are awake and alert.  Take over-the-counter and  prescription medicines only as told by your health care provider.  If you smoke, do not smoke without supervision.  Keep all follow-up visits as told by your health care provider. This is important. Contact a health care provider if:  You keep feeling nauseous or you keep vomiting.  You feel light-headed.  You develop a rash.  You have a fever. Get help right away if:  You have trouble breathing. This  information is not intended to replace advice given to you by your health care provider. Make sure you discuss any questions you have with your health care provider. Document Revised: 08/12/2017 Document Reviewed: 12/20/2015 Elsevier Patient Education  2020 [employer].

## 2019-11-22 NOTE — Procedures (Signed)
Interventional Radiology Procedure Note  Procedure: CT guided bone marrow aspiration and biopsy  Complications: None  EBL: < 10 mL  Findings: Aspirate and core biopsy performed of bone marrow in right iliac bone.  Plan: Bedrest supine x 1 hrs  Kynisha Memon T. Trinidad Petron, M.D Pager:  [phone]   

## 2019-11-23 ENCOUNTER — Other Ambulatory Visit: Payer: Self-pay

## 2019-11-23 ENCOUNTER — Other Ambulatory Visit: Payer: Self-pay | Admitting: *Deleted

## 2019-11-23 DIAGNOSIS — C9000 Multiple myeloma not having achieved remission: Secondary | ICD-10-CM

## 2019-11-23 MED ORDER — OXYCODONE HCL 5 MG PO TABS: 5.0000 mg | ORAL_TABLET | ORAL | 0 refills | Status: DC | PRN

## 2019-11-23 NOTE — Telephone Encounter (Signed)
Requested refill of oxycodone

## 2019-11-23 NOTE — Telephone Encounter (Signed)
   HH has discharged patient. Patient has refused sservices

## 2019-11-26 LAB — SURGICAL PATHOLOGY

## 2019-11-28 ENCOUNTER — Telehealth: Payer: Self-pay

## 2019-11-28 NOTE — Telephone Encounter (Signed)
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  Unfortunately, there is nothing which can be done to "fix" the nerves.  His neuropathy is from chemotherapy which is needed to control myeloma. Continue gabapentin 300mg  in the morning and 600mg  at bedtime as prescribed for pain.

## 2019-11-28 NOTE — Telephone Encounter (Signed)
Patient says someone from our office called. Returning the call.

## 2019-11-30 ENCOUNTER — Encounter (HOSPITAL_COMMUNITY): Payer: Self-pay | Admitting: Hematology

## 2019-12-02 ENCOUNTER — Other Ambulatory Visit: Payer: Self-pay | Admitting: Internal Medicine

## 2019-12-04 ENCOUNTER — Encounter (HOSPITAL_COMMUNITY): Payer: Self-pay | Admitting: Hematology

## 2019-12-11 ENCOUNTER — Ambulatory Visit: Payer: Medicare Other | Attending: Internal Medicine

## 2019-12-11 DIAGNOSIS — Z23 Encounter for immunization: Secondary | ICD-10-CM

## 2019-12-11 NOTE — Progress Notes (Signed)
HEMATOLOGY/ONCOLOGY CLINIC NOTE  Date of Service: 12/12/2019  Patient Care Team: Cassandria Anger, MD as PCP - General Erline Levine, MD as Attending Physician (Neurosurgery) Larey Dresser, MD (Cardiology) Jarome Matin, MD as Consulting Physician (Dermatology) Rigoberto Noel, MD as Consulting Physician (Pulmonary Disease) Alda Berthold, DO as Consulting Physician (Neurology) Deloria Lair, NP as [facility]   CHIEF COMPLAINTS/PURPOSE OF CONSULTATION:   Plasma cell myeloma   HISTORY OF PRESENTING ILLNESS:  [name] [name] is a wonderful 79 y.o. male who has been referred to [place] by Angelena Form, PA for evaluation and management of lytic bone lesions. The pt reports that he is doing well overall.  The pt reports that he has occasional hip pain that radiates down his legs and prevents him from walking. He uses Advil, which helps his hip pain. He is used to exercising often, but has not been able to stay very physically active lately so he is gaining weight. The pt experiences some SOB when he wakes up in the morning.  The pt had a pre-procedural CTA C/A/P before a TAVR completed on 03/14/2019 which revealed "indeterminate osseus lesions in the bony pelvis," which led to an MRI and PET scan. He reports that he has pain when he pushes on his right chest.   He also notes that he had a fall while exercising in 06/2018 and thought he broke his back. He received a blood transfusion on 07/24/2018.   Of note prior to the patient's visit today, the pt has had a MRI pelvis w/wo contrast completed on 03/28/2019 with results revealing "1. Destructive bone lesions as detailed above. Findings most consistent with metastatic disease. PET-CT may be helpful for further evaluation and to establish a primary tumor. The right pelvic bone lesions should be amenable to image guided biopsy but a PET scan may demonstrate easier/safer biopsy sites. 2. No intrapelvic mass or  adenopathy. 3. Benign intraosseous lipoma involving the left anterior superior acetabulum."  The pt has also had PET whole body completed on 04/06/2019 with results revealing "1. Diffuse osseous metastatic disease as detailed above without findings for a primary neoplasm in the chest, abdomen or pelvis. The large destructive lesion involving the right ischium should be amenable to image guided biopsy. 2. Two small retroperitoneal lymph nodes and 1 small right obturator node showing hypermetabolism."   Most recent lab results (04/06/2019) of CBC is as follows: all values are WNL.  On review of systems, pt reports hip and leg pain, weight gain and denies syncope and denies belly pain, recent neuropathy and any other symptoms.   On PMHx the pt reports 5 cm hepatic flexure polyp removal, pneumonia, blood transfusion on 07/24/2018  On Social Hx the pt reports that he lives at home with his wife. He is from [place].    INTERVAL HISTORY:  [name] [name] is a wonderful 79 y.o. male who has who is here today for evaluation and management of his Plasma Cell Myeloma. Pt is accompanied by his wife. The patient's last visit with [place] was on 10/25/19. The pt reports that he is doing well overall.  The pt reports he is good. He has gotten both doses of the COVID19 vaccine. Pt is having pain in the ankle's. He has stopped going to PT and he has been walking with a cane for assistance. Pt has been eating well.   Of note since the patient's last visit, pt has had CT Biopsy ([id number]) completed on 11/22/19  with results revealing CT guided bone marrow biopsy of right posterior iliac bone with both aspirate and core samples obtained. 11/22/19, Pt has had CT Bone Marrow Biopsy and Aspiration ([id number]) with results revealing CT guided bone marrow biopsy of right posterior iliac bone with both aspirate and core samples obtained. 11/21/19 of PET Whole Body ([id number]) with results revealing 1. Near-total resolution  of previous multifocal hypermetabolic bony lesions. The dominant right posterior acetabular lesion has reduced in maximum SUV from 11.9 down to 2.2 today, with some increase in internal calcifications/sclerosis indicating healing response. 2. Sclerotic and lucent residua of prior myelomatous lesions. 3. No new lesions or hypermetabolic soft tissue lesions are observed. 4. Low-grade activity associated with a focal narrowing in the ascending colon, probably from peristaltic activity given the lack of similar findings on the recent prior CT or on prior PET-CT, but correlation with colon cancer screening history is recommended and if not up-to-date, standard colon cancer screening would be recommended. 5. Other imaging findings of potential clinical significance: Aortic Atherosclerosis (ICD10-I70.0). Stable ascending thoracic aortic aneurysm at 4.3 cm in diameter. Subcutaneous edema along the ankles and feet. Prominent stool throughout the colon favors constipation. Hernia Prostatomegaly. Direct left inguinal contains part of the sigmoid colon without strangulation or obstruction.  Lab results today (12/12/19) of CBC w/diff and CMP is as follows: all values are WNL except for WBC at 17.7K, RBC at 3.96, Hemoglobin at 12.2, HCT at 37.5, Neutro Abs at 15.2, Monocytes Abs at 1.3, Abs Immature Granulocytes at 0.23K, Glucose at 108, BUN at 33 12/12/19 of MMP: -no M spike 12/12/19 of Kappa/Lambda Light Chains: all values are WNL 12/12/19 of [place], Serum: 143  On review of systems, pt reports healthy appetite and denies headaches, vision changes, right hip pain and any other symptoms.    MEDICAL HISTORY:  Past Medical History:  Diagnosis Date  . Ascending aortic aneurysm ([place])   . Bicuspid aortic valve   . Cancer ([place])   . CHF NYHA class I (no symptoms from ordinary activities), acute, diastolic ([place])   . Fatty liver    mild  . GI bleeding 07/21/2018   post polypectomy  . Hemorrhoids   . HTN  (hypertension)   . Hypercholesteremia   . Hypokalemia   . Internal hemorrhoids   . LBP (low back pain)   . Moderate aortic stenosis   . Osteoarthritis   . Paroxysmal atrial fibrillation ([place])    a. new onset Afib in 07/2008. He underwent ibutilide cardioversion successfully. b. Recurrence 01/2013 s/p TEE/DCCV - was on Xarelto but he stopped it as he was convinced it was causing joint pn. c. Recurrence 01/2016 - spont conv to NSR. Pt took Eliquis x1 mo then declined further anticoag. d. Recurrence 07/2016.  [name] Pneumonia   . Tubular adenoma of colon     SURGICAL HISTORY: Past Surgical History:  Procedure Laterality Date  . BACK SURGERY  x12 years ago  . CARDIOVERSION N/A 01/26/2013   Procedure: CARDIOVERSION;  Surgeon: Larey Dresser, MD;  Location: [facility] ENDOSCOPY;  Service: Cardiovascular;  Laterality: N/A;  . CARDIOVERSION N/A 10/28/2017   Procedure: CARDIOVERSION;  Surgeon: Larey Dresser, MD;  Location: [facility] ENDOSCOPY;  Service: Cardiovascular;  Laterality: N/A;  . CARDIOVERSION N/A 03/03/2018   Procedure: CARDIOVERSION;  Surgeon: Lelon Perla, MD;  Location: [facility] ENDOSCOPY;  Service: Cardiovascular;  Laterality: N/A;  . CARDIOVERSION N/A 09/19/2019   Procedure: CARDIOVERSION;  Surgeon: Larey Dresser, MD;  Location: [facility];  Service: Cardiovascular;  Laterality: N/A;  . COLONOSCOPY    . COLONOSCOPY  07/17/2018   at [facility]  . HEMORRHOID SURGERY    . LUMBAR LAMINECTOMY    . POLYPECTOMY    . RIGHT HEART CATH N/A 08/20/2019   Procedure: RIGHT HEART CATH;  Surgeon: Larey Dresser, MD;  Location: [facility] CV LAB;  Service: Cardiovascular;  Laterality: N/A;  . RIGHT/LEFT HEART CATH AND CORONARY ANGIOGRAPHY N/A 03/07/2019   Procedure: RIGHT/LEFT HEART CATH AND CORONARY ANGIOGRAPHY;  Surgeon: Burnell Blanks, MD;  Location: [place] CV LAB;  Service: Cardiovascular;  Laterality: N/A;  . [name]  04/2019  . TEE WITHOUT CARDIOVERSION N/A 01/26/2013   Procedure: TRANSESOPHAGEAL  ECHOCARDIOGRAM (TEE);  Surgeon: Larey Dresser, MD;  Location: [place];  Service: Cardiovascular;  Laterality: N/A;  . TEE WITHOUT CARDIOVERSION N/A 10/28/2017   Procedure: TRANSESOPHAGEAL ECHOCARDIOGRAM (TEE);  Surgeon: Larey Dresser, MD;  Location: [facility] ENDOSCOPY;  Service: Cardiovascular;  Laterality: N/A;  . TEE WITHOUT CARDIOVERSION N/A 05/08/2019   Procedure: TRANSESOPHAGEAL ECHOCARDIOGRAM (TEE);  Surgeon: Burnell Blanks, MD;  Location: [place] CV LAB;  Service: Open Heart Surgery;  Laterality: N/A;  . TEE WITHOUT CARDIOVERSION N/A 09/19/2019   Procedure: TRANSESOPHAGEAL ECHOCARDIOGRAM (TEE);  Surgeon: Larey Dresser, MD;  Location: [facility] ENDOSCOPY;  Service: Cardiovascular;  Laterality: N/A;  . TRANSCATHETER AORTIC VALVE REPLACEMENT, TRANSFEMORAL N/A 05/08/2019   Procedure: TRANSCATHETER AORTIC VALVE REPLACEMENT, TRANSFEMORAL;  Surgeon: Burnell Blanks, MD;  Location: [place] CV LAB;  Service: Open Heart Surgery;  Laterality: N/A;    SOCIAL HISTORY: Social History   Socioeconomic History  . Marital status: Married    Spouse name: Not on file  . Number of children: 0  . Years of education: Not on file  . Highest education level: Not on file  Occupational History  . Occupation: Retired Lobbyist: [employer]  Tobacco Use  . Smoking status: Never Smoker  . Smokeless tobacco: Never Used  Substance and Sexual Activity  . Alcohol use: Yes    Comment: Drinks 1 glass of wine nightly/socially  . Drug use: No  . Sexual activity: Yes  Other Topics Concern  . Not on file  Social History Narrative   Patient lives in [place] w/ his wife. He is a native of [place]. 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   Right-handed   Caffeine: 2 cups coffee per day   Two story home   Social Determinants of Health   Financial Resource Strain:   . Difficulty of Paying Living Expenses:   Food Insecurity:   .  Worried About Charity fundraiser in the Last Year:   . Arboriculturist in the Last Year:   Transportation Needs:   . Film/video editor (Medical):   [name] Lack of Transportation (Non-Medical):   Physical Activity:   . Days of Exercise per Week:   . Minutes of Exercise per Session:   Stress:   . Feeling of Stress :   Social Connections:   . Frequency of Communication with Friends and Family:   . Frequency of Social Gatherings with Friends and Family:   . Attends Religious Services:   . Active Member of Clubs or Organizations:   . Attends Archivist Meetings:   [name] Marital Status:   Intimate Partner Violence:   . Fear of Current or Ex-Partner:   . Emotionally Abused:   [name] Physically  Abused:   . Sexually Abused:     FAMILY HISTORY: Family History  Problem Relation Age of Onset  . Colon cancer Mother 67  . Hypertension Other   . Coronary artery disease Neg Hx   . Colon polyps Neg Hx   . Esophageal cancer Neg Hx   . Rectal cancer Neg Hx   . Stomach cancer Neg Hx     ALLERGIES:  is allergic to xarelto [rivaroxaban]; ambien [zolpidem]; corticosteroids; ramipril; and benazepril.  MEDICATIONS:  Current Outpatient Medications  Medication Sig Dispense Refill  . acyclovir (ZOVIRAX) 400 MG tablet Take 1 tablet (400 mg total) by mouth 2 (two) times daily. 180 tablet 3  . Amino Acids-Protein Hydrolys (PRO-STAT 101) LIQD Take 30 mLs by mouth daily. 887 mL 5  . amiodarone (PACERONE) 200 MG tablet Take 1 tablet (200 mg total) by mouth daily. 30 tablet 1  . amLODipine (NORVASC) 5 MG tablet Take 1 tablet (5 mg total) by mouth daily. 90 tablet 3  . apixaban (ELIQUIS) 5 MG TABS tablet Take 5 mg by mouth 2 (two) times daily.    [name] b complex vitamins tablet Take 1 tablet by mouth daily. 100 tablet 3  . Calcium-Magnesium 500-250 MG TABS Take 1 tablet by mouth daily.    . Carboxymethylcellul-Glycerin (LUBRICATING EYE DROPS OP) Place 1 drop into both eyes daily as needed (dry eyes).    .  Cholecalciferol (VITAMIN D) 50 MCG (2000 UT) tablet Take 2,000 Units by mouth daily.    [name] dexamethasone (DECADRON) 4 MG tablet Take 3 tablets (12 mg total) by mouth once a week. 22 tablet 1  . DULoxetine (CYMBALTA) 60 MG capsule TAKE 1 CAPSULE BY MOUTH EVERY DAY 90 capsule 1  . ergocalciferol (VITAMIN D2) 1.25 MG (50000 UT) capsule Take 50,000 Units by mouth once a week. Every Monday    . famotidine (PEPCID) 40 MG tablet Take 1 tablet (40 mg total) by mouth daily. 30 tablet 11  . furosemide (LASIX) 20 MG tablet Take 20 mg by mouth every other day.    . gabapentin (NEURONTIN) 300 MG capsule Take 1 capsule (300 mg total) by mouth 3 (three) times daily.    [name] loratadine (CLARITIN) 10 MG tablet TAKE 1 TABLET BY MOUTH EVERY DAY 90 tablet 3  . LORazepam (ATIVAN) 0.5 MG tablet Take 1 tablet (0.5 mg total) by mouth every 8 (eight) hours as needed (Nausea or vomiting). 60 tablet 2  . metoprolol succinate (TOPROL-XL) 25 MG 24 hr tablet Take 1 tablet (25 mg total) by mouth 2 (two) times daily. 60 tablet 6  . ondansetron (ZOFRAN) 8 MG tablet Take 1 tablet (8 mg total) by mouth 2 (two) times daily as needed (Nausea or vomiting). 30 tablet 1  . oxyCODONE (OXY IR/ROXICODONE) 5 MG immediate release tablet Take 1-2 tablets (5-10 mg total) by mouth every 4 (four) hours as needed for moderate pain or severe pain. 120 tablet 0  . polyethylene glycol powder (GLYCOLAX/MIRALAX) 17 GM/SCOOP powder Take 17-34 g by mouth 2 (two) times daily as needed for moderate constipation. 500 g 5  . prochlorperazine (COMPAZINE) 10 MG tablet Take 1 tablet (10 mg total) by mouth every 6 (six) hours as needed (Nausea or vomiting). 30 tablet 1  . senna-docusate (SENOKOT-S) 8.6-50 MG tablet Take 2 tablets by mouth at bedtime.    [name] spironolactone (ALDACTONE) 25 MG tablet Take 1 tablet (25 mg total) by mouth daily. 30 tablet 3  . amoxicillin-clavulanate (AUGMENTIN) 875-125 MG tablet Take  1 tablet by mouth 2 (two) times daily. (Patient not  taking: Reported on 12/12/2019) 20 tablet 0  . diclofenac Sodium (VOLTAREN) 1 % GEL Apply 1 application topically 4 (four) times daily as needed (pain).     No current facility-administered medications for this visit.    REVIEW OF SYSTEMS:   A 10+ POINT REVIEW OF SYSTEMS WAS OBTAINED including neurology, dermatology, psychiatry, cardiac, respiratory, lymph, extremities, GI, GU, Musculoskeletal, constitutional, breasts, reproductive, HEENT.  All pertinent positives are noted in the HPI.  All others are negative.    PHYSICAL EXAMINATION: Vitals:   12/12/19 1350  BP: 128/75  Pulse: 60  Resp: 18  Temp: 98.5 F (36.9 C)  SpO2: 100%   Wt Readings from Last 3 Encounters:  12/12/19 160 lb 3.2 oz (72.7 kg)  11/07/19 158 lb (71.7 kg)  10/23/19 153 lb 12.8 oz (69.8 kg)   Body mass index is 22.34 kg/m.    ECOG FS:2 - Symptomatic, <50% confined to bed   GENERAL:alert, in no acute distress and comfortable SKIN: no acute rashes, no significant lesions EYES: conjunctiva are pink and non-injected, sclera anicteric OROPHARYNX: MMM, no exudates, no oropharyngeal erythema or ulceration NECK: supple, no JVD LYMPH:  no palpable lymphadenopathy in the cervical, axillary or inguinal regions LUNGS: clear to auscultation b/l with normal respiratory effort HEART: regular rate & rhythm ABDOMEN:  normoactive bowel sounds , non tender, not distended. Extremity: no pedal edema PSYCH: alert & oriented x 3 with fluent speech NEURO: no focal motor/sensory deficits  LABORATORY DATA:  I have reviewed the data as listed  . CBC Latest Ref Rng & Units 12/12/2019 11/22/2019 10/11/2019  WBC 4.0 - 10.5 K/uL 17.7(H) 11.2(H) 12.3(H)  Hemoglobin 13.0 - 17.0 g/dL 12.2(L) 11.4(L) 11.8(L)  Hematocrit 39.0 - 52.0 % 37.5(L) 35.7(L) 36.6(L)  Platelets 150 - 400 K/uL 153 153 187    . CMP Latest Ref Rng & Units 12/12/2019 11/22/2019 10/23/2019  Glucose 70 - 99 mg/dL 108(H) 93 123(H)  BUN 8 - 23 mg/dL 33(H) 32(H) 24(H)    Creatinine 0.61 - 1.24 mg/dL 1.09 0.99 1.38(H)  Sodium 135 - 145 mmol/L 140 139 138  Potassium 3.5 - 5.1 mmol/L 4.0 4.3 4.8  Chloride 98 - 111 mmol/L 105 104 101  CO2 22 - 32 mmol/L _0 Calcium 8.9 - 10.3 mg/dL 9.3 8.9 9.5  Total Protein 6.5 - 8.1 g/dL 6.5 - 5.9(L)  Total Bilirubin 0.3 - 1.2 mg/dL 0.5 - 0.6  Alkaline Phos 38 - 126 U/L 61 - 56  AST 15 - 41 U/L 17 - 37  ALT 0 - 44 U/L 21 - 82(H)   Component     Latest Ref Rng & Units 04/30/2019  Total Protein, Urine-UPE24     Not Estab. mg/dL   Total Protein, Urine-Ur/day     30 - 150 mg/24 hr   ALBUMIN, U     %   ALPHA 1 URINE     %   Alpha 2, Urine     %   % BETA, Urine     %   GAMMA GLOBULIN URINE     %   Free Kappa Lt Chains,Ur     0.63 - 113.79 mg/L   Free Lambda Lt Chains,Ur     0.47 - 11.77 mg/L   Free Kappa/Lambda Ratio     1.03 - 31.76   Immunofixation Result, Urine        Total Volume  M-SPIKE %, Urine     Not Observed %   M-Spike, mg/24 hr     Not Observed mg/24 hr   NOTE:        IgG (Immunoglobin G), Serum     603 - 1,613 mg/dL 1,740 (H)  IgA     61 - 437 mg/dL 16 (L)  IgM (Immunoglobulin M), Srm     15 - 143 mg/dL 9 (L)  Total Protein ELP     6.0 - 8.5 g/dL 7.3  Albumin SerPl Elph-Mcnc     2.9 - 4.4 g/dL 3.8  Alpha 1     0.0 - 0.4 g/dL 0.3  Alpha2 Glob SerPl Elph-Mcnc     0.4 - 1.0 g/dL 0.7  B-Globulin SerPl Elph-Mcnc     0.7 - 1.3 g/dL 0.9  Gamma Glob SerPl Elph-Mcnc     0.4 - 1.8 g/dL 1.6  M Protein SerPl Elph-Mcnc     Not Observed g/dL 1.5 (H)  Globulin, Total     2.2 - 3.9 g/dL 3.5  Albumin/Glob SerPl     0.7 - 1.7 1.1  IFE 1      Comment  Please Note (HCV):      Comment  Kappa free light chain     3.3 - 19.4 mg/L 821.2 (H)  Lamda free light chains     5.7 - 26.3 mg/L 1.9 (L)  Kappa, lamda light chain ratio     0.26 - 1.65 432.21 (H)  LDH     98 - 192 U/L 132  Sed Rate     0 - 16 mm/hr 3   05/24/2019 FISH Panel     05/24/2019 Bone Marrow Biopsy     04/16/2019 Surgical Pathology:   RADIOGRAPHIC STUDIES: I have personally reviewed the radiological images as listed and agreed with the findings in the report. NM PET Image Restage (PS) Whole Body  Result Date: 11/21/2019 CLINICAL DATA:  Subsequent treatment strategy for multiple myeloma. EXAM: NUCLEAR MEDICINE PET WHOLE BODY TECHNIQUE: 7.8 mCi F-18 FDG was injected intravenously. Full-ring PET imaging was performed from the skull base to thigh after the radiotracer. CT data was obtained and used for attenuation correction and anatomic localization. Fasting blood glucose: 92 mg/dl COMPARISON:  04/06/2019 and CT abdomen from 09/14/2019 and CT chest from 09/30/2019 FINDINGS: Mediastinal blood pool activity: SUV max 2.2 HEAD/NECK: No significant abnormal hypermetabolic activity in this region. Incidental CT findings: Bilateral common carotid atherosclerotic calcification. CHEST: No significant abnormal hypermetabolic activity in this region. Incidental CT findings: Aortic atherosclerosis. Aortic valve replacement. Ascending thoracic aortic aneurysm 4.3 cm in diameter. Mild scarring or atelectasis in the right middle lobe and lingula. ABDOMEN/PELVIS: Focal thick-walled enrolling in the ascending colon with mildly accentuated activity with maximum SUV 4.2, not readily apparent on the CT of 09/14/2019 or on the prior PET-CT, probably from peristaltic activity or focal contraction rather than malignancy, but correlation with patient's colon screening history would be recommended. Incidental CT findings: Prominent stool throughout the colon favors constipation. Photopenic left mid kidney cyst posteriorly. Aortoiliac atherosclerotic vascular disease. Prominent prostate gland indents the urinary bladder. Direct left inguinal hernia contains a small part of the sigmoid colon, without strangulation or obstruction. SKELETON: Near-total resolution of the previous multifocal hypermetabolic bony lesions. One of the  dominant destructive lesions was previously in the right posterior acetabulum, maximum SUV in this vicinity is currently 2.2, previously 11.9. This was previously a purely lytic lesion with soft tissue component, there is been significant new calcification  in this vicinity and the extraosseous component is no longer present. The large anterior lucent acetabular and iliac bone lesion with ground-glass matrix has maximum SUV of 2.4 (previously 2.9). Incidental CT findings: Callus formation from prior right anterior rib fractures, without significant hypermetabolic activity. Prior anterior wedge compression fracture at T11. Vertical sclerosis in both sacral ala probably from remote prior sacral insufficiency fracture. EXTREMITIES: No significant abnormal hypermetabolic activity in this region. Incidental CT findings: Soft tissue swelling along the dorsum of the feet, nonspecific. Subcutaneous edema along the ankles. IMPRESSION: 1. Near-total resolution of previous multifocal hypermetabolic bony lesions. The dominant right posterior acetabular lesion has reduced in maximum SUV from 11.9 down to 2.2 today, with some increase in internal calcifications/sclerosis indicating healing response. 2. Sclerotic and lucent residua of prior myelomatous lesions. 3. No new lesions or hypermetabolic soft tissue lesions are observed. 4. Low-grade activity associated with a focal narrowing in the ascending colon, probably from peristaltic activity given the lack of similar findings on the recent prior CT or on prior PET-CT, but correlation with colon cancer screening history is recommended and if not up-to-date, standard colon cancer screening would be recommended. 5. Other imaging findings of potential clinical significance: Aortic Atherosclerosis (ICD10-I70.0). Stable ascending thoracic aortic aneurysm at 4.3 cm in diameter. Subcutaneous edema along the ankles and feet. Prominent stool throughout the colon favors constipation. Hernia  Prostatomegaly. Direct left inguinal contains part of the sigmoid colon without strangulation or obstruction. Electronically Signed   By: Van Clines M.D.   On: 11/21/2019 15:37   CT Biopsy  Result Date: 11/22/2019 CLINICAL DATA:  History of multiple myeloma. Assessment of status of disease after treatment. EXAM: CT GUIDED BONE MARROW ASPIRATION AND BIOPSY ANESTHESIA/SEDATION: Versed 2.0 mg IV, Fentanyl 100 mcg IV Total Moderate Sedation Time:   10 minutes. The patient's level of consciousness and physiologic status were continuously monitored during the procedure by Radiology nursing. PROCEDURE: The procedure risks, benefits, and alternatives were explained to the patient. Questions regarding the procedure were encouraged and answered. The patient understands and consents to the procedure. A time out was performed prior to initiating the procedure. The right gluteal region was prepped with chlorhexidine. Sterile gown and sterile gloves were used for the procedure. Local anesthesia was provided with 1% Lidocaine. Under CT guidance, an 11 gauge On Control bone cutting needle was advanced from a posterior approach into the right iliac bone. Needle positioning was confirmed with CT. Initial non heparinized and heparinized aspirate samples were obtained of bone marrow. Core biopsy was performed via the On Control drill needle. COMPLICATIONS: None FINDINGS: Inspection of initial aspirate did reveal visible particles. Intact core biopsy sample was obtained. IMPRESSION: CT guided bone marrow biopsy of right posterior iliac bone with both aspirate and core samples obtained. Electronically Signed   By: Aletta Edouard M.D.   On: 11/22/2019 13:36   NCV with EMG(electromyography)  Result Date: 11/21/2019 Alda Berthold, DO     11/21/2019 12:45 PM [facility] [address], [place]  [place], [place] [address] Tel: [phone] Fax:  [phone] Test Date:  11/21/2019 Patient: Nirvan Laban DOB:  08-20-1941 Physician: Narda Amber, DO Sex: Male Height: '5\' 11"'$  Ref Phys: Narda Amber, DO ID#: [id number] Temp: 32.0C Technician:  Patient Complaints: This is a 79 year old man with plasma cell myeloma who developed numbness in the feet and imbalance following chemotherapy. NCV & EMG Findings: Extensive electrodiagnostic testing of the right lower extremity and additional studies of the left shows: 1.  Bilateral sural and superficial peroneal sensory responses are absent. 2. Bilateral peroneal motor responses at the extensor digitorum brevis are absent, and reduced at the tibialis anterior (R1.9, L1.7 mV).  Bilateral tibial motor responses show reduced amplitude (R2.0, L1.7 mV) and decreased conduction velocity (Knee-Ankle, L36, R37 m/s).  3. Tibial H reflex is prolonged on the right and absent on the left. 4. Chronic motor axonal loss changes are seen affecting bilateral tibialis anterior, medial gastrocnemius, and right rectus femoris muscles, without accompanied active denervation.  Proximal and deep muscles were not tested as the patient is on anticoagulation therapy. 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 ___________________________ Narda Amber, DO Nerve Conduction Studies Anti Sensory Summary Table  Stim Site NR Peak (ms) Norm Peak (ms) P-T Amp (V) Norm P-T Amp Left Sup Peroneal Anti Sensory (Ant Lat Mall)  32C 12 cm NR  <4.6  >3 Right Sup Peroneal Anti Sensory (Ant Lat Mall)  32C 12 cm NR  <4.6  >3 Left Sural Anti Sensory (Lat Mall)  32C Calf NR  <4.6  >3 Right Sural Anti Sensory (Lat Mall)  32C Calf NR  <4.6  >3 Motor Summary Table  Stim Site NR Onset (ms) Norm Onset (ms) O-P Amp (mV) Norm O-P Amp Site1 Site2 Delta-0 (ms) Dist (cm) Vel (m/s) Norm Vel (m/s) Left Peroneal Motor (Ext Dig Brev)  32C Ankle NR  <6.0  >2.5 B Fib Ankle  0.0  >40 B Fib NR     Poplt B Fib  0.0  >40 Poplt  NR           Right Peroneal Motor (Ext Dig Brev)  32C Ankle NR  <6.0  >2.5 B Fib Ankle  0.0  >40 B Fib NR     Poplt B Fib  0.0  >40 Poplt NR           Left Peroneal TA Motor (Tib Ant)  32C Fib Head    4.1 <4.5 1.7 >3 Poplit Fib Head 2.4 10.0 42 >40 Poplit    6.5  1.3        Right Peroneal TA Motor (Tib Ant)  32C Fib Head    4.1 <4.5 1.9 >3 Poplit Fib Head 2.0 10.0 50 >40 Poplit    6.1  1.4        Left Tibial Motor (Abd Hall Brev)  32C Ankle    4.5 <6.0 1.7 >4 Knee Ankle 11.5 41.0 36 >40 Knee    16.0  1.1        Right Tibial Motor (Abd Hall Brev)  32C Ankle    4.5 <6.0 2.0 >4 Knee Ankle 11.8 44.0 37 >40 Knee    16.3  1.2        H Reflex Studies  NR H-Lat (ms) Lat Norm (ms) L-R H-Lat (ms) Left Tibial (Gastroc)  32C NR  <35  Right Tibial (Gastroc)  32C    43.27 <35  EMG  Side Muscle Ins Act Fibs Psw Fasc Number Recrt Dur Dur. Amp Amp. Poly Poly. Comment Right AntTibialis Nml Nml Nml Nml 3- Rapid All 1+ All 1+ All 1+ N/A Right Gastroc Nml Nml Nml Nml 2- Rapid Most 1+ Most 1+ Most 1+ N/A Right RectFemoris Nml Nml Nml Nml 1- Rapid Few 1+ Few 1+ Few 1+ N/A Right BicepsFemS _0  _1  Nml Nml N/A Left AntTibialis _2  Nml  _0  Nml N/A Left BicepsFemS Nml Nml Nml Nml 3- Rapid Most 1+ Most 1+ Most 1+ N/A Left Gastroc Nml Nml Nml Nml 2- Rapid Most 1+ Most 1+ Most 1+ N/A Left RectFemoris _1  _2  Nml Nml N/A Waveforms:               CT BONE MARROW BIOPSY & ASPIRATION  Result Date: 11/22/2019 CLINICAL DATA:  History of multiple myeloma. Assessment of status of disease after treatment. EXAM: CT GUIDED BONE MARROW ASPIRATION AND BIOPSY ANESTHESIA/SEDATION: Versed 2.0 mg IV, Fentanyl 100 mcg IV Total Moderate Sedation Time:   10 minutes. The patient's level of consciousness and physiologic status were continuously monitored during the procedure by Radiology nursing. PROCEDURE: The procedure risks, benefits, and alternatives were  explained to the patient. Questions regarding the procedure were encouraged and answered. The patient understands and consents to the procedure. A time out was performed prior to initiating the procedure. The right gluteal region was prepped with chlorhexidine. Sterile gown and sterile gloves were used for the procedure. Local anesthesia was provided with 1% Lidocaine. Under CT guidance, an 11 gauge On Control bone cutting needle was advanced from a posterior approach into the right iliac bone. Needle positioning was confirmed with CT. Initial non heparinized and heparinized aspirate samples were obtained of bone marrow. Core biopsy was performed via the On Control drill needle. COMPLICATIONS: None FINDINGS: Inspection of initial aspirate did reveal visible particles. Intact core biopsy sample was obtained. IMPRESSION: CT guided bone marrow biopsy of right posterior iliac bone with both aspirate and core samples obtained. Electronically Signed   By: Aletta Edouard M.D.   On: 11/22/2019 13:36    ASSESSMENT & PLAN:   79 yo   #1 Recently diagnosed Plasma cell myeloma  03/28/2019 MRI pelvis w/wo contrast revealed "1. Destructive bone lesions as detailed above. Findings most consistent with metastatic disease. PET-CT may be helpful for further evaluation and to establish a primary tumor. The right pelvic bone lesions should be amenable to image guided biopsy but a PET scan may demonstrate easier/safer biopsy sites. 2. No intrapelvic mass or adenopathy. 3. Benign intraosseous lipoma involving the left anterior superior acetabulum."  04/06/2019 PET whole body revealed "1. Diffuse osseous metastatic disease as detailed above without findings for a primary neoplasm in the chest, abdomen or pelvis. The large destructive lesion involving the right ischium should be amenable to image guided biopsy. 2. Two small retroperitoneal lymph nodes and 1 small right obturator node showing hypermetabolism."   04/16/2019  Posterior right pelvis bone biopsy revealed "PLASMA CELL NEOPLASM"  05/24/2019 Bone Marrow Biopsy revealed "BONE MARROW: - CELLULAR MARROW WITH INVOLVEMENT BY PLASMA CELL NEOPLASM (20%) PERIPHERAL BLOOD: - MORPHOLOGICALLY UNREMARKABLE"  05/24/2019 FISH Panel revealed "ABNORMAL result with 11q+, 14q+ and +17"  #2 Severe aortic stenosis with bicuspid aortic valve -10/26/2018 ECHO revealed AVA at 0.8 cm2 and LV EF of 60-65% -05/08/2019 pt had a Transfemoral Transcatheter Aortic Valve Replacement   PLAN: -Discussed pt labwork today, 12/12/19; of CBC w/diff and CMP is as follows: all values are WNL except for WBC at 17.7K, RBC at 3.96, Hemoglobin at 12.2, HCT at 37.5, Neutro Abs at 15.2, Monocytes Abs at 1.3, Abs Immature Granulocytes at 0.23K, Glucose at 108, BUN at 33 -Discussed 12/12/19 of MMP:  No M spike -Discussed 12/12/19 of Kappa/Lambda Light Chains WNL -Discussed 12/12/19 of Copper, Serum: WNL -Discussed 11/22/19 of  CT Biopsy ([id number])  -Discussed  11/21/19 of PET Whole Body ([id number])  -No overt evidence of myeloma progression at this time -Myeloma panel with absent M spike now suggesting excellent treatment response. -Advised on neuropathy and falling -Advised on monitoring or maintenance Revlimid -Educated on oxandrolone to help build muscle and better functioning  -Recommended that nutrition,PT and optimizing his cardiac issues should be primary concern -Recommended that the pt continue to eat well, drink at least 48-64 oz of water each day, and walk 20-30 minutes each day. -Recommend cancer rehab center or outpatient PT -Will see back in 2 months with labs 1 week prior  FOLLOW UP: RTC with Dr Irene Limbo with labs in 2 months. Plz schedule labs 1 week prior to clinic visit Zometa infusion in 2 months with clinic visit Referral to outpatient cancer rehab clinic    The total time spent in the appt was 30 minutes and more than 50% was on counseling and direct patient  cares.  All of the patient's questions were answered with apparent satisfaction. The patient knows to call the clinic with any problems, questions or concerns.   Sullivan Lone MD MS AAHIVMS [facility] Hematology/Oncology Physician [facility]  (Office):       [phone] (Work cell):  [phone] (Fax):           [phone]  12/12/2019 4:12 PM  I, Dawayne Cirri am acting as a Education administrator for Dr. Sullivan Lone.   .I have reviewed the above documentation for accuracy and completeness, and I agree with the above. Brunetta Genera MD

## 2019-12-11 NOTE — Progress Notes (Signed)
   Covid-19 Vaccination Clinic  Name:  Mohamadou Roughton    MRN: [id number] DOB: June 05, 1941  12/11/2019  Mr. Randel was observed post Covid-19 immunization for 15 minutes without incident. He was provided with Vaccine Information Sheet and instruction to access the V-Safe system.   Mr. Reel was instructed to call 911 with any severe reactions post vaccine: [name] Difficulty breathing  . Swelling of face and throat  . A fast heartbeat  . A bad rash all over body  . Dizziness and weakness   Immunizations Administered    Name Date Dose VIS Date Route   Pfizer COVID-19 Vaccine 12/11/2019 11:22 AM 0.3 mL 08/24/2019 Intramuscular   Manufacturer: [place]   Lot: [id number]   [place]: [id number]

## 2019-12-12 ENCOUNTER — Inpatient Hospital Stay: Payer: Medicare Other

## 2019-12-12 ENCOUNTER — Inpatient Hospital Stay: Payer: Medicare Other | Attending: Hematology | Admitting: Hematology

## 2019-12-12 ENCOUNTER — Other Ambulatory Visit: Payer: Self-pay

## 2019-12-12 VITALS — BP 128/75 | HR 60 | Temp 98.5°F | Resp 18 | Ht 71.0 in | Wt 160.2 lb

## 2019-12-12 DIAGNOSIS — M25551 Pain in right hip: Secondary | ICD-10-CM | POA: Diagnosis not present

## 2019-12-12 DIAGNOSIS — G629 Polyneuropathy, unspecified: Secondary | ICD-10-CM

## 2019-12-12 DIAGNOSIS — E7800 Pure hypercholesterolemia, unspecified: Secondary | ICD-10-CM | POA: Insufficient documentation

## 2019-12-12 DIAGNOSIS — C9000 Multiple myeloma not having achieved remission: Secondary | ICD-10-CM | POA: Insufficient documentation

## 2019-12-12 DIAGNOSIS — M1990 Unspecified osteoarthritis, unspecified site: Secondary | ICD-10-CM | POA: Insufficient documentation

## 2019-12-12 DIAGNOSIS — C7951 Secondary malignant neoplasm of bone: Secondary | ICD-10-CM

## 2019-12-12 DIAGNOSIS — Z79899 Other long term (current) drug therapy: Secondary | ICD-10-CM | POA: Insufficient documentation

## 2019-12-12 DIAGNOSIS — I480 Paroxysmal atrial fibrillation: Secondary | ICD-10-CM | POA: Diagnosis not present

## 2019-12-12 DIAGNOSIS — I5030 Unspecified diastolic (congestive) heart failure: Secondary | ICD-10-CM | POA: Insufficient documentation

## 2019-12-12 DIAGNOSIS — I10 Essential (primary) hypertension: Secondary | ICD-10-CM | POA: Diagnosis not present

## 2019-12-12 LAB — CBC WITH DIFFERENTIAL/PLATELET
Abs Immature Granulocytes: 0.23 10*3/uL — ABNORMAL HIGH (ref 0.00–0.07)
Basophils Absolute: 0 10*3/uL (ref 0.0–0.1)
Basophils Relative: 0 %
Eosinophils Absolute: 0 10*3/uL (ref 0.0–0.5)
Eosinophils Relative: 0 %
HCT: 37.5 % — ABNORMAL LOW (ref 39.0–52.0)
Hemoglobin: 12.2 g/dL — ABNORMAL LOW (ref 13.0–17.0)
Immature Granulocytes: 1 %
Lymphocytes Relative: 5 %
Lymphs Abs: 0.9 10*3/uL (ref 0.7–4.0)
MCH: 30.8 pg (ref 26.0–34.0)
MCHC: 32.5 g/dL (ref 30.0–36.0)
MCV: 94.7 fL (ref 80.0–100.0)
Monocytes Absolute: 1.3 10*3/uL — ABNORMAL HIGH (ref 0.1–1.0)
Monocytes Relative: 7 %
Neutro Abs: 15.2 10*3/uL — ABNORMAL HIGH (ref 1.7–7.7)
Neutrophils Relative %: 87 %
Platelets: 153 10*3/uL (ref 150–400)
RBC: 3.96 MIL/uL — ABNORMAL LOW (ref 4.22–5.81)
RDW: 14.2 % (ref 11.5–15.5)
WBC: 17.7 10*3/uL — ABNORMAL HIGH (ref 4.0–10.5)
nRBC: 0 % (ref 0.0–0.2)

## 2019-12-12 LAB — CMP (CANCER CENTER ONLY)
ALT: 21 U/L (ref 0–44)
AST: 17 U/L (ref 15–41)
Albumin: 4.1 g/dL (ref 3.5–5.0)
Alkaline Phosphatase: 61 U/L (ref 38–126)
Anion gap: 10 (ref 5–15)
BUN: 33 mg/dL — ABNORMAL HIGH (ref 8–23)
CO2: 25 mmol/L (ref 22–32)
Calcium: 9.3 mg/dL (ref 8.9–10.3)
Chloride: 105 mmol/L (ref 98–111)
Creatinine: 1.09 mg/dL (ref 0.61–1.24)
GFR, Est AFR Am: >60 mL/min (ref >60)
GFR, Estimated: >60 mL/min (ref >60)
Glucose, Bld: 108 mg/dL — ABNORMAL HIGH (ref 70–99)
Potassium: 4 mmol/L (ref 3.5–5.1)
Sodium: 140 mmol/L (ref 135–145)
Total Bilirubin: 0.5 mg/dL (ref 0.3–1.2)
Total Protein: 6.5 g/dL (ref 6.5–8.1)

## 2019-12-13 DIAGNOSIS — Z7901 Long term (current) use of anticoagulants: Secondary | ICD-10-CM | POA: Diagnosis not present

## 2019-12-13 DIAGNOSIS — R54 Age-related physical debility: Secondary | ICD-10-CM | POA: Diagnosis not present

## 2019-12-13 DIAGNOSIS — K4090 Unilateral inguinal hernia, without obstruction or gangrene, not specified as recurrent: Secondary | ICD-10-CM | POA: Diagnosis not present

## 2019-12-13 DIAGNOSIS — Z8579 Personal history of other malignant neoplasms of lymphoid, hematopoietic and related tissues: Secondary | ICD-10-CM | POA: Diagnosis not present

## 2019-12-13 LAB — KAPPA/LAMBDA LIGHT CHAINS
Kappa free light chain: 7.9 mg/L (ref 3.3–19.4)
Kappa, lambda light chain ratio: 0.99 (ref 0.26–1.65)
Lambda free light chains: 8 mg/L (ref 5.7–26.3)

## 2019-12-15 LAB — COPPER, SERUM: Copper: 143 ug/dL — ABNORMAL HIGH (ref 69–132)

## 2019-12-17 LAB — MULTIPLE MYELOMA PANEL, SERUM
Albumin SerPl Elph-Mcnc: 3.9 g/dL (ref 2.9–4.4)
Albumin/Glob SerPl: 1.8 — ABNORMAL HIGH (ref 0.7–1.7)
Alpha 1: 0.3 g/dL (ref 0.0–0.4)
Alpha2 Glob SerPl Elph-Mcnc: 0.6 g/dL (ref 0.4–1.0)
B-Globulin SerPl Elph-Mcnc: 0.8 g/dL (ref 0.7–1.3)
Gamma Glob SerPl Elph-Mcnc: 0.4 g/dL (ref 0.4–1.8)
Globulin, Total: 2.2 g/dL (ref 2.2–3.9)
IgA: 40 mg/dL — ABNORMAL LOW (ref 61–437)
IgG (Immunoglobin G), Serum: 339 mg/dL — ABNORMAL LOW (ref 603–1613)
IgM (Immunoglobulin M), Srm: 42 mg/dL (ref 15–143)
Total Protein ELP: 6.1 g/dL (ref 6.0–8.5)

## 2019-12-21 ENCOUNTER — Telehealth: Payer: Self-pay | Admitting: Hematology

## 2019-12-21 ENCOUNTER — Other Ambulatory Visit (HOSPITAL_COMMUNITY): Payer: Self-pay | Admitting: Cardiology

## 2019-12-21 NOTE — Telephone Encounter (Signed)
Scheduled per los, patient has been called and notified. 

## 2019-12-27 ENCOUNTER — Telehealth (HOSPITAL_COMMUNITY): Payer: Self-pay

## 2019-12-27 NOTE — Telephone Encounter (Signed)
Received request for cardiac clearance from central [place] [facility].  Clearance signed and faxed back.  Confirmation received. Original scanned into chart.

## 2019-12-31 ENCOUNTER — Ambulatory Visit: Payer: Medicare Other | Attending: Hematology | Admitting: Physical Therapy

## 2019-12-31 ENCOUNTER — Other Ambulatory Visit: Payer: Self-pay

## 2019-12-31 DIAGNOSIS — I890 Lymphedema, not elsewhere classified: Secondary | ICD-10-CM | POA: Diagnosis not present

## 2019-12-31 DIAGNOSIS — M6281 Muscle weakness (generalized): Secondary | ICD-10-CM | POA: Insufficient documentation

## 2019-12-31 DIAGNOSIS — R262 Difficulty in walking, not elsewhere classified: Secondary | ICD-10-CM | POA: Insufficient documentation

## 2019-12-31 DIAGNOSIS — R293 Abnormal posture: Secondary | ICD-10-CM | POA: Insufficient documentation

## 2019-12-31 DIAGNOSIS — R296 Repeated falls: Secondary | ICD-10-CM | POA: Insufficient documentation

## 2019-12-31 NOTE — Therapy (Signed)
[place], [place], [address] Phone: [phone]   Fax:  [phone]  Physical Therapy Evaluation  Patient Details  Name: [name] [name] MRN: [id number] Date of Birth: 1941/03/15 Referring Provider (PT): Dr. Irene Limbo    Encounter Date: 12/31/2019  PT End of Session - 12/31/19 1454    Visit Number  1    Number of Visits  17    Date for PT Re-Evaluation  03/03/20    PT Start Time  1000    PT Stop Time  1100    PT Time Calculation (min)  60 min    Activity Tolerance  Patient limited by fatigue;Patient limited by pain    Behavior During Therapy  [facility] for tasks assessed/performed       Past Medical History:  Diagnosis Date  . Ascending aortic aneurysm ([place])   . Bicuspid aortic valve   . Cancer ([place])   . CHF NYHA class I (no symptoms from ordinary activities), acute, diastolic ([place])   . Fatty liver    mild  . GI bleeding 07/21/2018   post polypectomy  . Hemorrhoids   . HTN (hypertension)   . Hypercholesteremia   . Hypokalemia   . Internal hemorrhoids   . LBP (low back pain)   . Moderate aortic stenosis   . Osteoarthritis   . Paroxysmal atrial fibrillation ([place])    a. new onset Afib in 07/2008. He underwent ibutilide cardioversion successfully. b. Recurrence 01/2013 s/p TEE/DCCV - was on Xarelto but he stopped it as he was convinced it was causing joint pn. c. Recurrence 01/2016 - spont conv to NSR. Pt took Eliquis x1 mo then declined further anticoag. d. Recurrence 07/2016.  [name] Pneumonia   . Tubular adenoma of colon     Past Surgical History:  Procedure Laterality Date  . BACK SURGERY  x12 years ago  . CARDIOVERSION N/A 01/26/2013   Procedure: CARDIOVERSION;  Surgeon: Larey Dresser, MD;  Location[facility] ENDOSCOPY;  Service: Cardiovascular;  Laterality: N/A;  . CARDIOVERSION N/A 10/28/2017   Procedure: CARDIOVERSION;  Surgeon: Larey Dresser, MD;  Location: [facility] ENDOSCOPY;  Service: Cardiovascular;   Laterality: N/A;  . CARDIOVERSION N/A 03/03/2018   Procedure: CARDIOVERSION;  Surgeon: Lelon Perla, MD;  Location: [facility] ENDOSCOPY;  Service: Cardiovascular;  Laterality: N/A;  . CARDIOVERSION N/A 09/19/2019   Procedure: CARDIOVERSION;  Surgeon: Larey Dresser, MD;  Location: [facility] ENDOSCOPY;  Service: Cardiovascular;  Laterality: N/A;  . COLONOSCOPY    . COLONOSCOPY  07/17/2018   at [facility]  . HEMORRHOID SURGERY    . LUMBAR LAMINECTOMY    . POLYPECTOMY    . RIGHT HEART CATH N/A 08/20/2019   Procedure: RIGHT HEART CATH;  Surgeon: Larey Dresser, MD;  Location: [place] CV LAB;  Service: Cardiovascular;  Laterality: N/A;  . RIGHT/LEFT HEART CATH AND CORONARY ANGIOGRAPHY N/A 03/07/2019   Procedure: RIGHT/LEFT HEART CATH AND CORONARY ANGIOGRAPHY;  Surgeon: Burnell Blanks, MD;  Location: [place] CV LAB;  Service: Cardiovascular;  Laterality: N/A;  . [name]  04/2019  . TEE WITHOUT CARDIOVERSION N/A 01/26/2013   Procedure: TRANSESOPHAGEAL ECHOCARDIOGRAM (TEE);  Surgeon: Larey Dresser, MD;  Location: [place];  Service: Cardiovascular;  Laterality: N/A;  . TEE WITHOUT CARDIOVERSION N/A 10/28/2017   Procedure: TRANSESOPHAGEAL ECHOCARDIOGRAM (TEE);  Surgeon: Larey Dresser, MD;  Location: [facility] ENDOSCOPY;  Service: Cardiovascular;  Laterality: N/A;  . TEE WITHOUT CARDIOVERSION N/A 05/08/2019   Procedure: TRANSESOPHAGEAL ECHOCARDIOGRAM (TEE);  Surgeon:  Burnell Blanks, MD;  Location: [place] CV LAB;  Service: Open Heart Surgery;  Laterality: N/A;  . TEE WITHOUT CARDIOVERSION N/A 09/19/2019   Procedure: TRANSESOPHAGEAL ECHOCARDIOGRAM (TEE);  Surgeon: Larey Dresser, MD;  Location: [facility] ENDOSCOPY;  Service: Cardiovascular;  Laterality: N/A;  . TRANSCATHETER AORTIC VALVE REPLACEMENT, TRANSFEMORAL N/A 05/08/2019   Procedure: TRANSCATHETER AORTIC VALVE REPLACEMENT, TRANSFEMORAL;  Surgeon: Burnell Blanks, MD;  Location: [place] CV LAB;  Service: Open Heart Surgery;   Laterality: N/A;    There were no vitals filed for this visit.   Subjective Assessment - 12/31/19 1009    Subjective  Pt has pain in his legs and feels that his balance is terrible, he uses a cane all the time. he gets tired even when he walks. Pt says he has lost 40pound during his illness.  He has tried copression stockings in the past for his feet swelling but cannot tolerate them for long    Patient is accompained by:  Family member   wife   Pertinent History  During preliminary work up for valve replacement in July of 2020 it was discovered that pt had large lytic lesions in his pelvis due to  multiple myeloma.  He had a TAVR in August 2021  and started chemotherapy for multiple myeloma which was successful in reducing the lestions. However,  he developed peripheral neuropathy and chemotherapy is being adjusted. He has multiple hospitalizations in dec-jan 2021 for septic colitis with acute kidney injury and pneumonia with associated deconditioning and difficutly walking due to Chemotherapy induced peripheral neuropathy.  Pt  had home health PT and continues to walk with a cane. Past history also significant for afib and back surgery.  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    Patient Stated Goals  Pt says he wants to improve his balance , decrease pain in his legs and not be so tired    Currently in Pain?  Yes    Pain Score  6     Pain Location  Foot   squeezing   Pain Orientation  Right;Left    Pain Descriptors / Indicators  Pins and needles   squeezing pain   Pain Type  Neuropathic pain    Pain Radiating Towards  comes up  to knees on occasion    Pain Onset  More than a month ago    Pain Frequency  Constant   especially in the front of his feet and toes   Aggravating Factors   walking    Pain Relieving Factors  can't say    Effect of Pain on Daily Activities  limites walking         [facility] PT Assessment - 12/31/19 0001      Assessment   Medical Diagnosis  multiple  myeloma     Referring Provider (PT)  Dr. Irene Limbo     Onset Date/Surgical Date  04/14/19      Precautions   Precautions  Fall      Restrictions   Weight Bearing Restrictions  No      Balance Screen   Has the patient fallen in the past 6 months  Yes    How many times?  5   "unexplainable" as to cause of fall    Has the patient had a decrease in activity level because of a fear of falling?   Yes    Is the patient reluctant to leave their home because of a fear of  falling?   No      Home Film/video editor residence    Living Arrangements  Spouse/significant other    Home Access  Stairs to enter    Entrance Stairs-Number of Steps  3    Entrance Stairs-Rails  Left    Additional Comments  two-story home      Prior Function   Level of Independence  Independent with household mobility with device   straight cane   Vocation  Retired    Leisure  pt says he is exercising "very little'"      Cognition   Overall Cognitive Status  Within Functional Limits for tasks assessed   needs occasional redirection to task      Observation/Other Assessments   Observations  Pt walks into PT with a straight cane with his wife helping at his other am. Gait is slow, Feet visibley swollen and pt wearing slippers as he says he cannot get his shoes on     Skin Integrity  pitting edema at feet and ankles, no open areas       Observation/Other Assessments-Edema    Edema  Circumferential      Sensation   Light Touch  Impaired by gross assessment    Additional Comments  pt says he cannot tell if his socks are off or on      Coordination   Gross Motor Movements are Fluid and Coordinated  No   very slow      Functional Tests   Functional tests  Sit to Stand      Sit to Stand   Comments  5 reps in 30 sec. pushed up with hands    limited by standing by pain in feet      Posture/Postural Control   Posture/Postural Control  Postural limitations    Postural Limitations  Rounded  Shoulders;Forward head;Decreased lumbar lordosis;Increased thoracic kyphosis      ROM / Strength   AROM / PROM / Strength  AROM;Strength      AROM   Overall AROM   Deficits    Overall AROM Comments  limited at end ranges of all joints., decreased dorsiflexion impairing heel strike in gait       Strength   Overall Strength  Deficits    Overall Strength Comments  pt is able to hold against and isometric force in a "hold" test, but had decreased dynamic strength in funtional mobility with very slow transistion movements and extra help needed if standing from a low surface and decrased ability to perform repeated sit to stands     Strength Assessment Site  Hand;Hip    Right Hand Grip (lbs)  64/58/58    Left Hand Grip (lbs)  72/60/60    Right/Left Hip  Right;Left    Right Hip Flexion  4-/5    Right Hip ABduction  3-/5    Left Hip Flexion  4-/5    Left Hip ABduction  3-/5      Bed Mobility   Bed Mobility  Rolling Right;Rolling Left;Supine to Sit;Sit to Supine   excessive time required    Rolling Right  Independent    Rolling Left  Independent    Supine to Sit  Independent    Sit to Supine  Independent      Ambulation/Gait   Ambulation/Gait  Yes    Ambulation/Gait Assistance  4: Min guard    Assistive device  Straight cane    Gait Pattern  Step-through pattern;Decreased  arm swing - right;Decreased arm swing - left;Decreased step length - right;Decreased step length - left;Decreased hip/knee flexion - right;Decreased hip/knee flexion - left;Decreased dorsiflexion - right;Decreased dorsiflexion - left;Decreased weight shift to right;Decreased weight shift to left;Shuffle;Decreased trunk rotation;Trunk flexed;Poor foot clearance - left;Poor foot clearance - right    Ambulation Surface  Indoor    Gait velocity  very slow       Standardized Balance Assessment   Standardized Balance Assessment  Timed Up and Go Test      Timed Up and Go Test   TUG  Normal TUG    Normal TUG (seconds)   25.35        LYMPHEDEMA/ONCOLOGY QUESTIONNAIRE - 12/31/19 1452      Type   Cancer Type  multiple myeloma       Treatment   Active Chemotherapy Treatment  Yes      What other symptoms do you have   Are you Having Heaviness or Tightness  Yes    Are you having Pain  Yes    Are you having pitting edema  Yes    Body Site  both feet and ankles     Is it Hard or Difficult finding clothes that fit  Yes    Do you have infections  No    Is there Decreased scar mobility  No    Stemmer Sign  Yes      Lymphedema Stage   Stage  STAGE 2 SPONTANEOUSLY IRREVERSIBLE      Right Lower Extremity Lymphedema   30 cm Proximal to Floor at Lateral Plantar Foot  34.7 cm    20 cm Proximal to Floor at Lateral Plantar Foot  '26 1    10 '$ cm Proximal to Floor at Lateral Malleoli  26.5 cm    5 cm Proximal to 1st MTP Joint  26 cm    Around Proximal Great Toe  9 cm      Left Lower Extremity Lymphedema   30 cm Proximal to Floor at Lateral Plantar Foot  33.3 cm    20 cm Proximal to Floor at Lateral Plantar Foot  26.7 cm    10 cm Proximal to Floor at Lateral Malleoli  29.4 cm    5 cm Proximal to 1st MTP Joint  27 cm    Around Proximal Great Toe  8.9 cm             Objective measurements completed on examination: See above findings.                PT Short Term Goals - 12/31/19 1512      PT SHORT TERM GOAL #1   Title  Pt and wife feel that they are able to manage pt feet and ankle swelling at home    Time  4    Period  Weeks    Status  New      PT SHORT TERM GOAL #2   Title  Pt will increase # of reps of sit to stand to 8 reps in 30 seconds indicating an improvment in functional strength    Baseline  5 reps in 30 seconds on 12/31/2019      PT SHORT TERM GOAL #3   Title  Pt will decrease normal TUG score to < 20 seconds indicating an improvmemnt in functional mobility    Baseline  25.35 sec on 12/31/2019    Time  4    Period  Weeks    Status  New  PT Long Term Goals -  12/31/19 1521      PT LONG TERM GOAL #1   Title  Pt will report the pain in his legs is decreased to 4/10    Baseline  6/10    Time  8    Period  Weeks    Status  New      PT LONG TERM GOAL #2   Title  Pt will report his tiredness with walking is decreased by 50%    Time  8    Period  Weeks    Status  New      PT LONG TERM GOAL #3   Title  Pt will increase # of reps of sit to stand to 10 reps in 30 seconds indicating an improvment in functional strength    Baseline  5 reps in 30 secons on 12/31/2019      PT LONG TERM GOAL #4   Title  Pt will decrease normal TUG score to < 15 seconds indicating an improvmemnt in functional mobility    Baseline  25.35 on 12/31/2019             Plan - 12/31/19 1455    Clinical Impression Statement  79 yo with multiple medical issues comes to PT today accompanied by his wife.  He is very deconditioned with decreased balance and gait due to CIPN pain numbness and swelling. He has ongoing chemo and states he may be having another procedure for an abdominal hernia. He has had home healh PT and is ready to progress  His wife is his primary caretake. Educated pt on elevation of feet and exercise of ankles to help contol the edema which may allow him to have better ankle mobility and balance.  She usually rubs his feet every night at home and wants to learn manual lymph drainage. She will bring his compression stockings in from home. Encouraed pt to stand during TV commercials and do weight shifting and glute sets. Pt says she is familiar with exericse as he has always been active.  I feel his is a good candidate for outpatient PT to help him achieve his goals of walking with better balance and less fatigue.    Personal Factors and Comorbidities  Age;Comorbidity 3+;Fitness;Past/Current Experience    Comorbidities  a fib, recent sepsis, kidney injury and pneumonia, ongoing chemo, past back surgery    Examination-Activity Limitations  Stand;Transfers;Stairs     Stability/Clinical Decision Making  Evolving/Moderate complexity    Clinical Decision Making  Moderate    Rehab Potential  Good    PT Frequency  2x / week    PT Duration  8 weeks    PT Treatment/Interventions  ADLs/Self Care Home Management;DME Instruction;Gait training;Stair training;Balance training;Therapeutic exercise;Therapeutic activities;Functional mobility training;Neuromuscular re-education;Cognitive remediation;Patient/family education;Manual lymph drainage;Orthotic Fit/Training;Manual techniques;Compression bandaging;Passive range of motion;Taping    PT Next Visit Plan  Do Berg balance test and set goal for it. See if wife brought in compression stockings, teach manual lymph drainage to feet and ankles with deep breathing  LE ROM exercises with HEP for same. Progress to UE and LE strengthening, then standing work for balance and strength and advanced gait.    Consulted and Agree with Plan of Care  Patient;Family member/caregiver       Patient will benefit from skilled therapeutic intervention in order to improve the following deficits and impairments:  Abnormal gait, Decreased balance, Decreased endurance, Decreased mobility, Difficulty walking, Impaired sensation, Impaired perceived functional ability, Increased edema, Decreased  knowledge of precautions, Decreased activity tolerance, Decreased strength, Pain, Postural dysfunction  Visit Diagnosis: Difficulty in walking, not elsewhere classified - Plan: PT plan of care cert/re-cert  Muscle weakness (generalized) - Plan: PT plan of care cert/re-cert  Abnormal posture - Plan: PT plan of care cert/re-cert  Repeated falls - Plan: PT plan of care cert/re-cert  Lymphedema, not elsewhere classified - Plan: PT plan of care cert/re-cert     Problem List Patient Active Problem List   Diagnosis Date Noted  . Acute cystitis without hematuria 11/07/2019  . Left inguinal hernia 11/07/2019  . Physical deconditioning   . Depression with  anxiety   . Acute respiratory failure with hypoxia ([place])   . Ischemic colitis ([place]) 09/18/2019  . Pressure injury of skin 09/16/2019  . Bloody diarrhea 09/14/2019  . Abdominal pain, diffuse 09/14/2019  . Acute metabolic encephalopathy 32/00/3794  . Goals of care, counseling/discussion 09/14/2019  . DNR (do not resuscitate) 09/14/2019  . Peripheral neuropathy 09/13/2019  . Gait disorder 09/13/2019  . Multiple myeloma not having achieved remission ([place]) 05/17/2019  . Counseling regarding advance care planning and goals of care 05/17/2019  . Bone metastases ([place]) 05/17/2019  . Postoperative fever 05/09/2019  . S/P TAVR (transcatheter aortic valve replacement) 05/08/2019  . Multiple myeloma ([place]) 05/08/2019  . Acute on chronic diastolic heart failure ([place]) 05/08/2019  . Severe aortic stenosis   . Polymyalgia rheumatica syndrome ([place]) 01/29/2019  . CAD (coronary artery disease) 08/01/2018  . Hematochezia 07/27/2018  . HTN (hypertension) 02/28/2018  . Hypercholesteremia 02/28/2018  . Degeneration of lumbar intervertebral disc 12/06/2017  . Adenomatous polyp of ascending colon 12/02/2017  . Chronic low back pain 09/26/2017  . Mastoiditis 03/21/2017  . Abnormal TSH 12/23/2016  . PNA (pneumonia)   . AKI (acute kidney injury) ([place])   . Ascending aortic aneurysm ([place]) 02/15/2013  . Atrial fibrillation with RVR ([place]) 01/23/2013  . Well adult exam 05/31/2012  . PAF (paroxysmal atrial fibrillation) ([place]) 08/05/2008  . HLD (hyperlipidemia) 04/08/2007  . Essential hypertension 04/08/2007   Donato Heinz. Owens Shark PT  Norwood Levo 12/31/2019, 3:26 PM  [place], [place], [address] Phone: [phone]   Fax:  [phone]  Name: [name] [name] MRN: [id number] Date of Birth: [birth year]

## 2020-01-05 ENCOUNTER — Other Ambulatory Visit (HOSPITAL_COMMUNITY): Payer: Self-pay | Admitting: Cardiology

## 2020-01-07 ENCOUNTER — Other Ambulatory Visit: Payer: Self-pay

## 2020-01-07 ENCOUNTER — Emergency Department (HOSPITAL_COMMUNITY)
Admission: EM | Admit: 2020-01-07 | Discharge: 2020-01-08 | Disposition: A | Discharge status: Home / Self Care | Payer: Medicare Other | Attending: Emergency Medicine | Admitting: Emergency Medicine

## 2020-01-07 ENCOUNTER — Encounter: Payer: Self-pay | Admitting: Physical Therapy

## 2020-01-07 ENCOUNTER — Ambulatory Visit: Payer: Medicare Other | Admitting: Physical Therapy

## 2020-01-07 ENCOUNTER — Emergency Department (HOSPITAL_COMMUNITY): Payer: Medicare Other

## 2020-01-07 DIAGNOSIS — I5032 Chronic diastolic (congestive) heart failure: Secondary | ICD-10-CM | POA: Diagnosis not present

## 2020-01-07 DIAGNOSIS — W1843XA Slipping, tripping and stumbling without falling due to stepping from one level to another, initial encounter: Secondary | ICD-10-CM | POA: Insufficient documentation

## 2020-01-07 DIAGNOSIS — S4992XA Unspecified injury of left shoulder and upper arm, initial encounter: Secondary | ICD-10-CM | POA: Diagnosis present

## 2020-01-07 DIAGNOSIS — I10 Essential (primary) hypertension: Secondary | ICD-10-CM | POA: Diagnosis not present

## 2020-01-07 DIAGNOSIS — Y999 Unspecified external cause status: Secondary | ICD-10-CM | POA: Diagnosis not present

## 2020-01-07 DIAGNOSIS — S3993XA Unspecified injury of pelvis, initial encounter: Secondary | ICD-10-CM | POA: Diagnosis not present

## 2020-01-07 DIAGNOSIS — Y92009 Unspecified place in unspecified non-institutional (private) residence as the place of occurrence of the external cause: Secondary | ICD-10-CM | POA: Diagnosis not present

## 2020-01-07 DIAGNOSIS — R296 Repeated falls: Secondary | ICD-10-CM

## 2020-01-07 DIAGNOSIS — R262 Difficulty in walking, not elsewhere classified: Secondary | ICD-10-CM

## 2020-01-07 DIAGNOSIS — D696 Thrombocytopenia, unspecified: Secondary | ICD-10-CM | POA: Diagnosis not present

## 2020-01-07 DIAGNOSIS — Z79899 Other long term (current) drug therapy: Secondary | ICD-10-CM | POA: Diagnosis not present

## 2020-01-07 DIAGNOSIS — I110 Hypertensive heart disease with heart failure: Secondary | ICD-10-CM | POA: Diagnosis not present

## 2020-01-07 DIAGNOSIS — S42292A Other displaced fracture of upper end of left humerus, initial encounter for closed fracture: Secondary | ICD-10-CM | POA: Diagnosis not present

## 2020-01-07 DIAGNOSIS — S42202A Unspecified fracture of upper end of left humerus, initial encounter for closed fracture: Secondary | ICD-10-CM | POA: Insufficient documentation

## 2020-01-07 DIAGNOSIS — W19XXXA Unspecified fall, initial encounter: Secondary | ICD-10-CM

## 2020-01-07 DIAGNOSIS — Z7901 Long term (current) use of anticoagulants: Secondary | ICD-10-CM | POA: Insufficient documentation

## 2020-01-07 DIAGNOSIS — D649 Anemia, unspecified: Secondary | ICD-10-CM | POA: Diagnosis not present

## 2020-01-07 DIAGNOSIS — I890 Lymphedema, not elsewhere classified: Secondary | ICD-10-CM

## 2020-01-07 DIAGNOSIS — S59902A Unspecified injury of left elbow, initial encounter: Secondary | ICD-10-CM | POA: Diagnosis not present

## 2020-01-07 DIAGNOSIS — R293 Abnormal posture: Secondary | ICD-10-CM

## 2020-01-07 DIAGNOSIS — Y9389 Activity, other specified: Secondary | ICD-10-CM | POA: Insufficient documentation

## 2020-01-07 DIAGNOSIS — C9000 Multiple myeloma not having achieved remission: Secondary | ICD-10-CM | POA: Insufficient documentation

## 2020-01-07 DIAGNOSIS — R52 Pain, unspecified: Secondary | ICD-10-CM | POA: Diagnosis not present

## 2020-01-07 DIAGNOSIS — M6281 Muscle weakness (generalized): Secondary | ICD-10-CM

## 2020-01-07 DIAGNOSIS — M79602 Pain in left arm: Secondary | ICD-10-CM | POA: Diagnosis not present

## 2020-01-07 DIAGNOSIS — S299XXA Unspecified injury of thorax, initial encounter: Secondary | ICD-10-CM | POA: Diagnosis not present

## 2020-01-07 DIAGNOSIS — M545 Low back pain: Secondary | ICD-10-CM | POA: Diagnosis not present

## 2020-01-07 LAB — BASIC METABOLIC PANEL
Anion gap: 7 (ref 5–15)
BUN: 22 mg/dL (ref 8–23)
CO2: 27 mmol/L (ref 22–32)
Calcium: 9.1 mg/dL (ref 8.9–10.3)
Chloride: 105 mmol/L (ref 98–111)
Creatinine, Ser: 1.22 mg/dL (ref 0.61–1.24)
GFR calc Af Amer: >60 mL/min (ref >60)
GFR calc non Af Amer: 56 mL/min — ABNORMAL LOW (ref >60)
Glucose, Bld: 114 mg/dL — ABNORMAL HIGH (ref 70–99)
Potassium: 4.5 mmol/L (ref 3.5–5.1)
Sodium: 139 mmol/L (ref 135–145)

## 2020-01-07 LAB — CBC
HCT: 38.5 % — ABNORMAL LOW (ref 39.0–52.0)
Hemoglobin: 12.3 g/dL — ABNORMAL LOW (ref 13.0–17.0)
MCH: 30.8 pg (ref 26.0–34.0)
MCHC: 31.9 g/dL (ref 30.0–36.0)
MCV: 96.3 fL (ref 80.0–100.0)
Platelets: 122 10*3/uL — ABNORMAL LOW (ref 150–400)
RBC: 4 MIL/uL — ABNORMAL LOW (ref 4.22–5.81)
RDW: 13.6 % (ref 11.5–15.5)
WBC: 9.7 10*3/uL (ref 4.0–10.5)
nRBC: 0 % (ref 0.0–0.2)

## 2020-01-07 LAB — PROTIME-INR
INR: 1.2 (ref 0.8–1.2)
Prothrombin Time: 14.9 seconds (ref 11.4–15.2)

## 2020-01-07 MED ORDER — ACETAMINOPHEN 500 MG PO TABS
1000.0000 mg | ORAL_TABLET | Freq: Once | ORAL | Status: AC
  Administered 2020-01-07: 1000 mg via ORAL
  Filled 2020-01-07: qty 2

## 2020-01-07 NOTE — Therapy (Addendum)
[place], [place], [address] Phone: [phone]   Fax:  [phone]  Physical Therapy Treatment  Patient Details  Name: [name] [name] MRN: [id number] Date of Birth: [birth year] Referring Provider (PT): Dr. Irene Limbo    Encounter Date: 01/07/2020  PT End of Session - 01/07/20 1206    Visit Number  2    Number of Visits  17    Date for PT Re-Evaluation  03/03/20    PT Start Time  1010    PT Stop Time  1055    PT Time Calculation (min)  45 min    Activity Tolerance  Patient tolerated treatment well    Behavior During Therapy  [facility] for tasks assessed/performed       Past Medical History:  Diagnosis Date  . Ascending aortic aneurysm ([place])   . Bicuspid aortic valve   . Cancer ([place])   . CHF NYHA class I (no symptoms from ordinary activities), acute, diastolic ([place])   . Fatty liver    mild  . GI bleeding 07/21/2018   post polypectomy  . Hemorrhoids   . HTN (hypertension)   . Hypercholesteremia   . Hypokalemia   . Internal hemorrhoids   . LBP (low back pain)   . Moderate aortic stenosis   . Osteoarthritis   . Paroxysmal atrial fibrillation ([place])    a. new onset Afib in 07/2008. He underwent ibutilide cardioversion successfully. b. Recurrence 01/2013 s/p TEE/DCCV - was on Xarelto but he stopped it as he was convinced it was causing joint pn. c. Recurrence 01/2016 - spont conv to NSR. Pt took Eliquis x1 mo then declined further anticoag. d. Recurrence 07/2016.  [name] Pneumonia   . Tubular adenoma of colon     Past Surgical History:  Procedure Laterality Date  . BACK SURGERY  x12 years ago  . CARDIOVERSION N/A 01/26/2013   Procedure: CARDIOVERSION;  Surgeon: Larey Dresser, MD;  Location: [facility] ENDOSCOPY;  Service: Cardiovascular;  Laterality: N/A;  . CARDIOVERSION N/A 10/28/2017   Procedure: CARDIOVERSION;  Surgeon: Larey Dresser, MD;  Location[facility] ENDOSCOPY;  Service: Cardiovascular;  Laterality: N/A;  .  CARDIOVERSION N/A 03/03/2018   Procedure: CARDIOVERSION;  Surgeon: Lelon Perla, MD;  Location: [facility] ENDOSCOPY;  Service: Cardiovascular;  Laterality: N/A;  . CARDIOVERSION N/A 09/19/2019   Procedure: CARDIOVERSION;  Surgeon: Larey Dresser, MD;  Location: [facility] ENDOSCOPY;  Service: Cardiovascular;  Laterality: N/A;  . COLONOSCOPY    . COLONOSCOPY  07/17/2018   at [facility]  . HEMORRHOID SURGERY    . LUMBAR LAMINECTOMY    . POLYPECTOMY    . RIGHT HEART CATH N/A 08/20/2019   Procedure: RIGHT HEART CATH;  Surgeon: Larey Dresser, MD;  Location: [place] CV LAB;  Service: Cardiovascular;  Laterality: N/A;  . RIGHT/LEFT HEART CATH AND CORONARY ANGIOGRAPHY N/A 03/07/2019   Procedure: RIGHT/LEFT HEART CATH AND CORONARY ANGIOGRAPHY;  Surgeon: Burnell Blanks, MD;  Location: [place] CV LAB;  Service: Cardiovascular;  Laterality: N/A;  . [name]  04/2019  . TEE WITHOUT CARDIOVERSION N/A 01/26/2013   Procedure: TRANSESOPHAGEAL ECHOCARDIOGRAM (TEE);  Surgeon: Larey Dresser, MD;  Location: [place];  Service: Cardiovascular;  Laterality: N/A;  . TEE WITHOUT CARDIOVERSION N/A 10/28/2017   Procedure: TRANSESOPHAGEAL ECHOCARDIOGRAM (TEE);  Surgeon: Larey Dresser, MD;  Location: [facility] ENDOSCOPY;  Service: Cardiovascular;  Laterality: N/A;  . TEE WITHOUT CARDIOVERSION N/A 05/08/2019   Procedure: TRANSESOPHAGEAL ECHOCARDIOGRAM (TEE);  Surgeon: Burnell Blanks,  MD;  Location: [place] CV LAB;  Service: Open Heart Surgery;  Laterality: N/A;  . TEE WITHOUT CARDIOVERSION N/A 09/19/2019   Procedure: TRANSESOPHAGEAL ECHOCARDIOGRAM (TEE);  Surgeon: Larey Dresser, MD;  Location: [facility] ENDOSCOPY;  Service: Cardiovascular;  Laterality: N/A;  . TRANSCATHETER AORTIC VALVE REPLACEMENT, TRANSFEMORAL N/A 05/08/2019   Procedure: TRANSCATHETER AORTIC VALVE REPLACEMENT, TRANSFEMORAL;  Surgeon: Burnell Blanks, MD;  Location: [place] CV LAB;  Service: Open Heart Surgery;  Laterality: N/A;    There  were no vitals filed for this visit.  Subjective Assessment - 01/07/20 1153    Subjective  Pt has been using his compression stockings at home and feels that his feet are a little less swollen He really wants to work on his balance as he feels that is is main problem    Pertinent History  During preliminary work up for valve replacement in July of 2020 it was discovered that pt had large lytic lesions in his pelvis due to  multiple myeloma.  He had a TAVR in August 2021  and started chemotherapy for multiple myeloma which was successful in reducing the lestions. However,  he developed peripheral neuropathy and chemotherapy is being adjusted. He has multiple hospitalizations in dec-jan 2021 for septic colitis with acute kidney injury and pneumonia with associated deconditioning and difficutly walking due to Chemotherapy induced peripheral neuropathy.  Pt  had home health PT and continues to walk with a cane. Past history also significant for afib and back surgery.  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    Patient Stated Goals  Pt says he wants to improve his balance , decrease pain in his legs and not be so tired    Currently in Pain?  No/denies         [facility] PT Assessment - 01/07/20 0001      Berg Balance Test   Sit to Stand  Able to stand  independently using hands    Standing Unsupported  Able to stand safely 2 minutes    Sitting with Back Unsupported but Feet Supported on Floor or Stool  Able to sit safely and securely 2 minutes    Stand to Sit  Controls descent by using hands    Transfers  Able to transfer safely, definite need of hands    Standing Unsupported with Eyes Closed  Able to stand 10 seconds safely    Standing Unsupported with Feet Together  Able to place feet together independently and stand 1 minute safely    From Standing, Reach Forward with Outstretched Arm  Can reach forward >5 cm safely (2")    From Standing Position, Pick up Object from Floor  Able to pick up  shoe, needs supervision    From Standing Position, Turn to Look Behind Over each Shoulder  Needs assist to keep from losing balance and falling    Turn 360 Degrees  Able to turn 360 degrees safely but slowly    Standing Unsupported, Alternately Place Feet on Step/Stool  Able to stand independently and complete 8 steps >20 seconds    Standing Unsupported, One Foot in [employer] balance while stepping or standing    Standing on One Leg  Tries to lift leg/unable to hold 3 seconds but remains standing independently    Total Score  36    Berg comment:  < 36 is high risk for falls if walking without at device ( close to 100%)  [facility] Adult PT Treatment/Exercise - 01/07/20 0001      Ambulation/Gait   Ambulation/Gait  Yes    Ambulation/Gait Assistance  4: Min [name]    Assistive device  Other (Comment)   2 dowel rods to use as walking poles    Gait Comments  encouraged pt to have increased arm swing on the right as he carries cane in left.  Tried using 2 dowel rods as walking poles and pt had increased trunk rotation in gait, increased gait velocity and longer stride length      Self-Care   Self-Care  Other Self-Care Comments    Other Self-Care Comments   gave wife information about sigvaris compre boot to help with feet swelling. They do not want to work on this in PT , they want to focus on balance       Exercises   Exercises  Shoulder;Lumbar      Lumbar Exercises: Standing   Row  Strengthening;Right;Left;20 reps;Theraband    Theraband Level (Row)  Level 1 (Yellow);Level 2 (Red)    Row Limitations  bilateral and unilateral     Other Standing Lumbar Exercises  heel raises and weight shift posteriorly wihile holding onto red theraband       Lumbar Exercises: Supine   Heel Slides  10 reps    Bridge  10 reps      Lumbar Exercises: Sidelying   Hip Abduction  Right;Left;5 reps      Shoulder Exercises: Supine   Horizontal ABduction  Strengthening;Right;Left;5  reps;Theraband    Theraband Level (Shoulder Horizontal ABduction)  Level 1 (Yellow)    External Rotation  Strengthening;Right;Left;5 reps;Theraband    Theraband Level (Shoulder External Rotation)  Level 1 (Yellow)    Flexion  Strengthening;Right;Left;5 reps;Theraband   wide and narrow grip    Theraband Level (Shoulder Flexion)  Level 1 (Yellow)    Diagonals  Strengthening;Right;Left;5 reps;Theraband    Theraband Level (Shoulder Diagonals)  Level 1 (Yellow)      Shoulder Exercises: Seated   Other Seated Exercises  neck and upper thoracic rotation for warm up                PT Short Term Goals - 12/31/19 1512      PT SHORT TERM GOAL #1   Title  Pt and wife feel that they are able to manage pt feet and ankle swelling at home    Time  4    Period  Weeks    Status  New      PT SHORT TERM GOAL #2   Title  Pt will increase # of reps of sit to stand to 8 reps in 30 seconds indicating an improvment in functional strength    Baseline  5 reps in 30 seconds on 12/31/2019      PT SHORT TERM GOAL #3   Title  Pt will decrease normal TUG score to < 20 seconds indicating an improvmemnt in functional mobility    Baseline  25.35 sec on 12/31/2019    Time  4    Period  Weeks    Status  New        PT Long Term Goals - 01/07/20 1214      PT LONG TERM GOAL #5   Title  Pt will increae BERG balancer score to > 46 for moderate risk for fall    Baseline  01/07/20 score is 36 and < 36 is high risk for falls if walking without at device ( close  to 100%)    Time  8    Period  Weeks    Status  New      Additional Long Term Goals   Additional Long Term Goals  Yes      PT LONG TERM GOAL #6   Title  Pt will be able to do a floor to stand transfer with min assist    Time  8    Period  Weeks    Status  New            Plan - 01/07/20 1206    Clinical Impression Statement  Pt is making slow gains at home and feels that his feet are better since using his compression stockings, though he  still have intermittent pain. Performed the BERG today which revealed that he has decreased trunk rotation and  problems with narrow base of support.  Began strengthening today and pt was able to walk better with 2 dowel rods for balance    Personal Factors and Comorbidities  Age;Comorbidity 3+;Fitness;Past/Current Experience    Comorbidities  a fib, recent sepsis, kidney injury and pneumonia, ongoing chemo, past back surgery    Stability/Clinical Decision Making  Evolving/Moderate complexity    Rehab Potential  Good    PT Frequency  2x / week    PT Duration  8 weeks    PT Treatment/Interventions  ADLs/Self Care Home Management;DME Instruction;Gait training;Stair training;Balance training;Therapeutic exercise;Therapeutic activities;Functional mobility training;Neuromuscular re-education;Cognitive remediation;Patient/family education;Manual lymph drainage;Orthotic Fit/Training;Manual techniques;Compression bandaging;Passive range of motion;Taping    PT Next Visit Plan  Gait training with 2 dowel rods, change direction, change speeds, direction, etc, stair training.  Eventually do floor to stand transfers  Progress to UE and LE strengthening, then standing work for balance and strength and advanced gait.    Consulted and Agree with Plan of Care  Patient;Family member/caregiver       Patient will benefit from skilled therapeutic intervention in order to improve the following deficits and impairments:  Abnormal gait, Decreased balance, Decreased endurance, Decreased mobility, Difficulty walking, Impaired sensation, Impaired perceived functional ability, Increased edema, Decreased knowledge of precautions, Decreased activity tolerance, Decreased strength, Pain, Postural dysfunction  Visit Diagnosis: Difficulty in walking, not elsewhere classified  Muscle weakness (generalized)  Abnormal posture  Repeated falls  Lymphedema, not elsewhere classified     Problem List Patient Active Problem List    Diagnosis Date Noted  . Acute cystitis without hematuria 11/07/2019  . Left inguinal hernia 11/07/2019  . Physical deconditioning   . Depression with anxiety   . Acute respiratory failure with hypoxia ([place])   . Ischemic colitis ([place]) 09/18/2019  . Pressure injury of skin 09/16/2019  . Bloody diarrhea 09/14/2019  . Abdominal pain, diffuse 09/14/2019  . Acute metabolic encephalopathy 00/86/7619  . Goals of care, counseling/discussion 09/14/2019  . DNR (do not resuscitate) 09/14/2019  . Peripheral neuropathy 09/13/2019  . Gait disorder 09/13/2019  . Multiple myeloma not having achieved remission ([place]) 05/17/2019  . Counseling regarding advance care planning and goals of care 05/17/2019  . Bone metastases ([place]) 05/17/2019  . Postoperative fever 05/09/2019  . S/P TAVR (transcatheter aortic valve replacement) 05/08/2019  . Multiple myeloma ([place]) 05/08/2019  . Acute on chronic diastolic heart failure ([place]) 05/08/2019  . Severe aortic stenosis   . Polymyalgia rheumatica syndrome ([place]) 01/29/2019  . CAD (coronary artery disease) 08/01/2018  . Hematochezia 07/27/2018  . HTN (hypertension) 02/28/2018  . Hypercholesteremia 02/28/2018  . Degeneration of lumbar intervertebral disc 12/06/2017  . Adenomatous  polyp of ascending colon 12/02/2017  . Chronic low back pain 09/26/2017  . Mastoiditis 03/21/2017  . Abnormal TSH 12/23/2016  . PNA (pneumonia)   . AKI (acute kidney injury) ([place])   . Ascending aortic aneurysm ([place]) 02/15/2013  . Atrial fibrillation with RVR ([place]) 01/23/2013  . Well adult exam 05/31/2012  . PAF (paroxysmal atrial fibrillation) ([place]) 08/05/2008  . HLD (hyperlipidemia) 04/08/2007  . Essential hypertension 04/08/2007   Donato Heinz. Owens Shark PT  Norwood Levo 01/07/2020, 12:18 PM  [place], [place], [address] Phone: [phone]   Fax:  [phone]  Name: [name] [name] MRN:  [id number] Date of Birth: [birth year] PHYSICAL THERAPY DISCHARGE SUMMARY  Visits from Start of Care: 2  Current functional level related to goals / functional outcomes: unknown   Remaining deficits: unknown   Education / Equipment: Home exercise  Plan: Patient agrees to discharge.  Patient goals were not met. Patient is being discharged due to a change in medical status.  ?????    Maudry Diego, PT 03/20/20 1:51 PM

## 2020-01-07 NOTE — Discharge Instructions (Addendum)
During this visit you were noted to have a slight anemia (low blood counts) and thrombocytopenia (low platelet counts) for which I recommend that you follow-up closely with your regular physician for monitoring and consideration of additional testing

## 2020-01-07 NOTE — ED Provider Notes (Signed)
[facility]ERGENCY DEPARTMENT Provider Note   CSN: [id number] Arrival date & time: 01/07/20  1549     History Chief Complaint  Patient presents with  . Fall    [name] [name] is a 79 y.o. male.  HPI    79 year old male with a past medical history of multiple myeloma, HTN, hypercholesterolemia, osteoarthritis, paroxysmal A. fib on Eliquis, CHF presenting to the emergency department as a level 2 trauma following a nonsyncopal fall which the patient was stepping off a scale at home and lost his balance falling backwards onto an outstretched left upper extremity complaining of left shoulder and humeral pain as well as sacral pain.  The patient denies losing consciousness.  Patient remembers the fall.  Patient was able to ambulate afterwards.  Patient denies pain elsewhere to his body.  Patient notes that he did bump his head on a doorknob.  He denies headaches, vision changes, neck pain, focal weakness.  Patient denies any lightheadedness, chest pain, palpitations, nausea, vomiting, abdominal pain, recent illness, sick contacts, Covid contacts, fevers, chills, cough, congestion, shortness of breath.  Past Medical History:  Diagnosis Date  . Ascending aortic aneurysm ([place])   . Bicuspid aortic valve   . Cancer ([place])   . CHF NYHA class I (no symptoms from ordinary activities), acute, diastolic ([place])   . Fatty liver    mild  . GI bleeding 07/21/2018   post polypectomy  . Hemorrhoids   . HTN (hypertension)   . Hypercholesteremia   . Hypokalemia   . Internal hemorrhoids   . LBP (low back pain)   . Moderate aortic stenosis   . Osteoarthritis   . Paroxysmal atrial fibrillation ([place])    a. new onset Afib in 07/2008. He underwent ibutilide cardioversion successfully. b. Recurrence 01/2013 s/p TEE/DCCV - was on Xarelto but he stopped it as he was convinced it was causing joint pn. c. Recurrence 01/2016 - spont conv to NSR. Pt took Eliquis x1 mo then declined further  anticoag. d. Recurrence 07/2016.  [name] Pneumonia   . Tubular adenoma of colon     Patient Active Problem List   Diagnosis Date Noted  . Acute cystitis without hematuria 11/07/2019  . Left inguinal hernia 11/07/2019  . Physical deconditioning   . Depression with anxiety   . Acute respiratory failure with hypoxia ([place])   . Ischemic colitis ([place]) 09/18/2019  . Pressure injury of skin 09/16/2019  . Bloody diarrhea 09/14/2019  . Abdominal pain, diffuse 09/14/2019  . Acute metabolic encephalopathy 85/27/7824  . Goals of care, counseling/discussion 09/14/2019  . DNR (do not resuscitate) 09/14/2019  . Peripheral neuropathy 09/13/2019  . Gait disorder 09/13/2019  . Multiple myeloma not having achieved remission ([place]) 05/17/2019  . Counseling regarding advance care planning and goals of care 05/17/2019  . Bone metastases ([place]) 05/17/2019  . Postoperative fever 05/09/2019  . S/P TAVR (transcatheter aortic valve replacement) 05/08/2019  . Multiple myeloma ([place]) 05/08/2019  . Acute on chronic diastolic heart failure ([place]) 05/08/2019  . Severe aortic stenosis   . Polymyalgia rheumatica syndrome ([place]) 01/29/2019  . CAD (coronary artery disease) 08/01/2018  . Hematochezia 07/27/2018  . HTN (hypertension) 02/28/2018  . Hypercholesteremia 02/28/2018  . Degeneration of lumbar intervertebral disc 12/06/2017  . Adenomatous polyp of ascending colon 12/02/2017  . Chronic low back pain 09/26/2017  . Mastoiditis 03/21/2017  . Abnormal TSH 12/23/2016  . PNA (pneumonia)   . AKI (acute kidney injury) ([place])   . Ascending aortic aneurysm ([place]) 02/15/2013  .  Atrial fibrillation with RVR ([place]) 01/23/2013  . Well adult exam 05/31/2012  . PAF (paroxysmal atrial fibrillation) ([place]) 08/05/2008  . HLD (hyperlipidemia) 04/08/2007  . Essential hypertension 04/08/2007    Past Surgical History:  Procedure Laterality Date  . BACK SURGERY  x12 years ago  . CARDIOVERSION N/A 01/26/2013   Procedure:  CARDIOVERSION;  Surgeon: Larey Dresser, MD;  Location: [facility] ENDOSCOPY;  Service: Cardiovascular;  Laterality: N/A;  . CARDIOVERSION N/A 10/28/2017   Procedure: CARDIOVERSION;  Surgeon: Larey Dresser, MD;  Location: [facility] ENDOSCOPY;  Service: Cardiovascular;  Laterality: N/A;  . CARDIOVERSION N/A 03/03/2018   Procedure: CARDIOVERSION;  Surgeon: Lelon Perla, MD;  Location: [facility] ENDOSCOPY;  Service: Cardiovascular;  Laterality: N/A;  . CARDIOVERSION N/A 09/19/2019   Procedure: CARDIOVERSION;  Surgeon: Larey Dresser, MD;  Location: [facility] ENDOSCOPY;  Service: Cardiovascular;  Laterality: N/A;  . COLONOSCOPY    . COLONOSCOPY  07/17/2018   at [facility]  . HEMORRHOID SURGERY    . LUMBAR LAMINECTOMY    . POLYPECTOMY    . RIGHT HEART CATH N/A 08/20/2019   Procedure: RIGHT HEART CATH;  Surgeon: Larey Dresser, MD;  Location: [facility] CV LAB;  Service: Cardiovascular;  Laterality: N/A;  . RIGHT/LEFT HEART CATH AND CORONARY ANGIOGRAPHY N/A 03/07/2019   Procedure: RIGHT/LEFT HEART CATH AND CORONARY ANGIOGRAPHY;  Surgeon: Burnell Blanks, MD;  Location: [facility] CV LAB;  Service: Cardiovascular;  Laterality: N/A;  . [name]  04/2019  . TEE WITHOUT CARDIOVERSION N/A 01/26/2013   Procedure: TRANSESOPHAGEAL ECHOCARDIOGRAM (TEE);  Surgeon: Larey Dresser, MD;  Location: [facility];  Service: Cardiovascular;  Laterality: N/A;  . TEE WITHOUT CARDIOVERSION N/A 10/28/2017   Procedure: TRANSESOPHAGEAL ECHOCARDIOGRAM (TEE);  Surgeon: Larey Dresser, MD;  Location: [facility] ENDOSCOPY;  Service: Cardiovascular;  Laterality: N/A;  . TEE WITHOUT CARDIOVERSION N/A 05/08/2019   Procedure: TRANSESOPHAGEAL ECHOCARDIOGRAM (TEE);  Surgeon: Burnell Blanks, MD;  Location: [facility] CV LAB;  Service: Open Heart Surgery;  Laterality: N/A;  . TEE WITHOUT CARDIOVERSION N/A 09/19/2019   Procedure: TRANSESOPHAGEAL ECHOCARDIOGRAM (TEE);  Surgeon: Larey Dresser, MD;  Location: [facility] ENDOSCOPY;  Service: Cardiovascular;   Laterality: N/A;  . TRANSCATHETER AORTIC VALVE REPLACEMENT, TRANSFEMORAL N/A 05/08/2019   Procedure: TRANSCATHETER AORTIC VALVE REPLACEMENT, TRANSFEMORAL;  Surgeon: Burnell Blanks, MD;  Location: [facility] CV LAB;  Service: Open Heart Surgery;  Laterality: N/A;       Family History  Problem Relation Age of Onset  . Colon cancer Mother 4  . Hypertension Other   . Coronary artery disease Neg Hx   . Colon polyps Neg Hx   . Esophageal cancer Neg Hx   . Rectal cancer Neg Hx   . Stomach cancer Neg Hx     Social History   Tobacco Use  . Smoking status: Never Smoker  . Smokeless tobacco: Never Used  Substance Use Topics  . Alcohol use: Yes    Comment: Drinks 1 glass of wine nightly/socially  . Drug use: No    Home Medications Prior to Admission medications   Medication Sig Start Date End Date Taking? Authorizing Provider  acyclovir (ZOVIRAX) 400 MG tablet Take 1 tablet (400 mg total) by mouth 2 (two) times daily. 08/15/19  Yes Brunetta Genera, MD  Amino Acids-Protein Hydrolys (PRO-STAT 101) LIQD Take 30 mLs by mouth daily. 10/08/19  Yes [name], [name], MD  amiodarone (PACERONE) 200 MG tablet Take 1 tablet (200 mg total) by mouth daily. 10/16/19  Yes  [name], [name], MD  amLODipine (NORVASC) 5 MG tablet Take 1 tablet (5 mg total) by mouth daily. 09/21/19 09/15/20 Yes Charlynne Cousins, MD  apixaban (ELIQUIS) 5 MG TABS tablet Take 5 mg by mouth 2 (two) times daily.   Yes [provider]  b complex vitamins tablet Take 1 tablet by mouth daily. 09/25/19  Yes [name], [name], MD  Calcium-Magnesium 500-250 MG TABS Take 1 tablet by mouth daily.   Yes [provider]  Cholecalciferol (VITAMIN D) 50 MCG (2000 UT) tablet Take 2,000 Units by mouth daily.   Yes [provider]  dexamethasone (DECADRON) 4 MG tablet Take 3 tablets (12 mg total) by mouth once a week. 10/16/19  Yes [name], [name], MD  diclofenac Sodium (VOLTAREN) 1 % GEL Apply  1 application topically 4 (four) times daily as needed (pain).   Yes [provider]  DULoxetine (CYMBALTA) 60 MG capsule TAKE 1 CAPSULE BY MOUTH EVERY DAY Patient taking differently: Take 60 mg by mouth daily.  11/08/19  Yes Brunetta Genera, MD  ergocalciferol (VITAMIN D2) 1.25 MG (50000 UT) capsule Take 50,000 Units by mouth once a week. Every Monday   Yes [provider]  famotidine (PEPCID) 40 MG tablet Take 1 tablet (40 mg total) by mouth daily. 01/29/19  Yes [name], [name], MD  furosemide (LASIX) 20 MG tablet TAKE 3 TABLETS BY MOUTH EVERY DAY Patient taking differently: Take 20 mg by mouth every other day.  01/07/20  Yes Larey Dresser, MD  gabapentin (NEURONTIN) 300 MG capsule Take 1 capsule (300 mg total) by mouth 3 (three) times daily. 10/02/19  Yes Barton Dubois, MD  loratadine (CLARITIN) 10 MG tablet TAKE 1 TABLET BY MOUTH EVERY DAY Patient taking differently: Take 10 mg by mouth daily.  12/02/19  Yes [name], [name], MD  LORazepam (ATIVAN) 0.5 MG tablet Take 1 tablet (0.5 mg total) by mouth every 8 (eight) hours as needed (Nausea or vomiting). 10/16/19  Yes [name], [name], MD  metoprolol succinate (TOPROL-XL) 25 MG 24 hr tablet TAKE 1 TABLET BY MOUTH TWICE A DAY Patient taking differently: Take 25 mg by mouth in the morning and at bedtime.  12/21/19  Yes Larey Dresser, MD  oxyCODONE (OXY IR/ROXICODONE) 5 MG immediate release tablet Take 1-2 tablets (5-10 mg total) by mouth every 4 (four) hours as needed for moderate pain or severe pain. 11/23/19  Yes Brunetta Genera, MD  polyethylene glycol powder [facility]) 17 GM/SCOOP powder Take 17-34 g by mouth 2 (two) times daily as needed for moderate constipation. 09/25/19  Yes [name], [name], MD  senna-docusate (SENOKOT-S) 8.6-50 MG tablet Take 2 tablets by mouth at bedtime.   Yes [provider]  spironolactone (ALDACTONE) 25 MG tablet Take 1 tablet (25 mg total) by mouth daily.  09/21/19  Yes Charlynne Cousins, MD  amoxicillin-clavulanate (AUGMENTIN) 875-125 MG tablet Take 1 tablet by mouth 2 (two) times daily. Patient not taking: Reported on 12/12/2019 11/07/19   Binnie Rail, MD  Carboxymethylcellul-Glycerin (LUBRICATING EYE DROPS OP) Place 1 drop into both eyes daily as needed (dry eyes).    [provider]  ondansetron (ZOFRAN) 8 MG tablet Take 1 tablet (8 mg total) by mouth 2 (two) times daily as needed (Nausea or vomiting). Patient not taking: Reported on 12/31/2019 05/17/19   Brunetta Genera, MD  prochlorperazine (COMPAZINE) 10 MG tablet Take 1 tablet (10 mg total) by mouth every 6 (six) hours as needed (Nausea  or vomiting). Patient not taking: Reported on 12/31/2019 05/17/19   Brunetta Genera, MD    Allergies    Xarelto [rivaroxaban], Ambien [zolpidem], Corticosteroids, Ramipril, and Benazepril  Review of Systems   Review of Systems  Constitutional: Negative for activity change, appetite change, chills, diaphoresis, fatigue and fever.  HENT: Negative for congestion and rhinorrhea.   Respiratory: Negative for cough, shortness of breath and wheezing.   Cardiovascular: Negative for chest pain.  Gastrointestinal: Negative for abdominal distention, abdominal pain, diarrhea, nausea and vomiting.  Genitourinary: Negative for decreased urine volume, difficulty urinating, dysuria, frequency and urgency.  Musculoskeletal: Positive for arthralgias and back pain. Negative for gait problem, joint swelling, myalgias, neck pain and neck stiffness.  Skin: Negative for color change and wound.  Neurological: Negative for dizziness, syncope, weakness, light-headedness and headaches.  All other systems reviewed and are negative.   Physical Exam Updated Vital Signs BP (!) 121/59   Pulse (!) 53   Temp 98 F (36.7 C) (Oral)   Resp 16   SpO2 100%   Physical Exam Vitals and nursing note reviewed.  Constitutional:      General: He is not in acute  distress.    Appearance: He is normal weight. He is not ill-appearing or toxic-appearing.  HENT:     Head: Normocephalic and atraumatic.     Comments: Mid-face stable    Right Ear: External ear normal.     Left Ear: External ear normal.     Ears:     Comments: No Battle sign    Nose: Nose normal. No congestion or rhinorrhea.     Comments: No septal hematoma    Mouth/Throat:     Comments: No evidence of oropharyngeal trauma Eyes:     Extraocular Movements: Extraocular movements intact.     Pupils: Pupils are equal, round, and reactive to light.  Neck:     Comments: no tenderness to palpation of C, T, L spine without step-offs or deformities, normal rectal tone, minimal tenderness to palpation of the coccyx/sacrum Cardiovascular:     Rate and Rhythm: Normal rate and regular rhythm.     Pulses: Normal pulses.     Heart sounds: Normal heart sounds.  Pulmonary:     Effort: Pulmonary effort is normal. No respiratory distress.     Breath sounds: Normal breath sounds. No wheezing or rhonchi.  Chest:     Chest wall: No tenderness.  Abdominal:     General: Abdomen is flat. Bowel sounds are normal.     Palpations: Abdomen is soft.     Tenderness: There is no abdominal tenderness. There is no guarding.  Musculoskeletal:     Comments: Tenderness to L shoulder and L upper arm without deformity, neurovascularly intact distally in all major nerve distributions, normal sensation overlying the deltoid, limited ROM of the shoulder 2/2 pain, 2+ radial and ulnar pulses in the LUE, otherwise without tenderness to full body palpation  Skin:    General: Skin is warm and dry.     Capillary Refill: Capillary refill takes less than 2 seconds.  Neurological:     General: No focal deficit present.     Mental Status: He is alert and oriented to person, place, and time. Mental status is at baseline.     ED Results / Procedures / Treatments   Labs (all labs ordered are listed, but only abnormal results  are displayed) Labs Reviewed  CBC - Abnormal; Notable for the following components:  Result Value   RBC 4.00 (*)    Hemoglobin 12.3 (*)    HCT 38.5 (*)    Platelets 122 (*)    All other components within normal limits  BASIC METABOLIC PANEL - Abnormal; Notable for the following components:   Glucose, Bld 114 (*)    GFR calc non Af Amer 56 (*)    All other components within normal limits  PROTIME-INR    EKG None  Radiology DG Chest 1 View  Result Date: 01/07/2020 CLINICAL DATA:  Fall. EXAM: CHEST  1 VIEW COMPARISON:  September 30, 2019. FINDINGS: The heart size and mediastinal contours are within normal limits. Both lungs are clear. No pneumothorax or pleural effusion is noted. Status post transcatheter aortic valve repair. The visualized skeletal structures are unremarkable. IMPRESSION: No active disease. Electronically Signed   By: Marijo Conception M.D.   On: 01/07/2020 17:15   DG Lumbar Spine 2-3 Views  Result Date: 01/07/2020 CLINICAL DATA:  Low back pain after fall. EXAM: LUMBAR SPINE - 2-3 VIEW COMPARISON:  July 02, 2016. FINDINGS: No definite fracture or spondylolisthesis is noted. Mild degenerative disc disease is noted at L1-2, L2-3 and L4-5. Diffuse osteopenia is noted. IMPRESSION: Mild multilevel degenerative disc disease. No acute abnormality seen in the lumbar spine. Electronically Signed   By: Marijo Conception M.D.   On: 01/07/2020 17:18   DG Pelvis 1-2 Views  Result Date: 01/07/2020 CLINICAL DATA:  Fall. EXAM: PELVIS - 1-2 VIEW COMPARISON:  September 14, 2019. FINDINGS: No definite fracture or dislocation is noted. There is again noted lucent lesions bilaterally, right greater than left, within the iliac bones and acetabuli, consistent with history of multiple myeloma as seen on prior CT scan. IMPRESSION: Stable lucent lesions are noted bilaterally, right greater than left, consistent with history of multiple myeloma. No acute fracture or dislocation is noted.  Electronically Signed   By: Marijo Conception M.D.   On: 01/07/2020 17:22   DG Elbow 2 Views Left  Result Date: 01/07/2020 CLINICAL DATA:  Left arm pain after fall. EXAM: LEFT ELBOW - 2 VIEW COMPARISON:  None. FINDINGS: There is no evidence of fracture, dislocation, or joint effusion. There is no evidence of arthropathy or other focal bone abnormality. Soft tissues are unremarkable. IMPRESSION: Negative. Electronically Signed   By: Marijo Conception M.D.   On: 01/07/2020 17:17   DG Shoulder Left  Result Date: 01/07/2020 CLINICAL DATA:  Left shoulder pain after fall. EXAM: LEFT SHOULDER - 2+ VIEW COMPARISON:  None. FINDINGS: Moderately displaced and comminuted fracture is seen involving the proximal left humeral head and neck. Glenohumeral joint is intact. Ribs are unremarkable. IMPRESSION: Moderately displaced and comminuted proximal left humeral head and neck fracture. Electronically Signed   By: Marijo Conception M.D.   On: 01/07/2020 17:19   DG Humerus Left  Result Date: 01/07/2020 CLINICAL DATA:  Left arm pain after fall. EXAM: LEFT HUMERUS - 2+ VIEW COMPARISON:  None. FINDINGS: Mildly displaced and possibly comminuted fracture is seen involving the proximal left humeral head and neck. No soft tissue abnormality is noted. IMPRESSION: Mildly displaced and possibly comminuted proximal left humeral head and neck fracture. Electronically Signed   By: Marijo Conception M.D.   On: 01/07/2020 17:13    Procedures Procedures (including critical care time)  Medications Ordered in ED Medications  acetaminophen (TYLENOL) tablet 1,000 mg (1,000 mg Oral Given 01/07/20 1711)    ED Course  I have reviewed the triage vital  signs and the nursing notes.  Pertinent labs & imaging results that were available during my care of the patient were reviewed by me and considered in my medical decision making (see chart for details).    MDM Rules/Calculators/A&P                       79 year old male with a past  medical history of multiple myeloma, HTN, hypercholesterolemia, osteoarthritis, paroxysmal A. fib on Eliquis, CHF presenting to the emergency department as a level 2 trauma following a nonsyncopal fall which the patient was stepping off a scale at home and lost his balance falling backwards onto an outstretched left upper extremity complaining of left shoulder and humeral pain as well as sacral pain.  Upon arrival of the patient, EMS provided pertinent history and exam findings. The patient was transferred over to the trauma bed. ABCs intact as exam above. Once 2 IVs were placed, the secondary exam was performed. Pertinent physical exam findings include L shoulder tenderness to palpation. Portable XRs performed at the bedside. Radiographs of the involved areas of the body were obtained. Pain control with tylenol 1 g.  No significant arrangements on BMP, CBC appears at baseline with a stable normocytic anemia with a hemoglobin 12.3 and MCV of 96.3, mild thrombocytopenia 122, coags within normal limits,  Radiographs demonstrated mildly displaced and possibly comminuted proximal left humeral head and neck fracture.  Consult placed to orthopedic surgery for further evaluation  Per on-call Ortho we will place the patient had a left shoulder sling the plan for follow-up with [facility] in the next several days for further evaluation and consideration of operative management.  Labs and imaging reviewed by myself and considered in medical decision making if ordered.  Imaging interpreted by radiology.  The plan for this patient was discussed with Dr. Ashok Cordia, who voiced agreement and who oversaw evaluation and treatment of this patient.     Final Clinical Impression(s) / ED Diagnoses Final diagnoses:  Fall  Thrombocytopenia ([place])  Normocytic anemia  Closed fracture of proximal end of left humerus, unspecified fracture morphology, initial encounter    Rx / DC Orders ED Discharge Orders    None        Filbert Berthold, MD 01/07/20 1805    Lajean Saver, MD 01/07/20 [phone]

## 2020-01-07 NOTE — ED Triage Notes (Signed)
Pt here from home via EMS, was stepping onto a scale and stepped backwards, tripping and falling. Braced fall with L arm, c/o L upper arm pain. No deformity. Hit head, no LOC, on eliquis.

## 2020-01-07 NOTE — Patient Instructions (Signed)
Over Head Pull: Narrow and Wide Grip   Cancer Rehab [phone]   On back, knees bent, feet flat, band across thighs, elbows straight but relaxed. Pull hands apart (start). Keeping elbows straight, bring arms up and over head, hands toward floor. Keep pull steady on band. Hold momentarily. Return slowly, keeping pull steady, back to start. Then do same with a wider grip on the band (past shoulder width) Repeat _5-10__ times. Band color __yellow____   Side Pull: Double Arm   On back, knees bent, feet flat. Arms perpendicular to body, shoulder level, elbows straight but relaxed. Pull arms out to sides, elbows straight. Resistance band comes across collarbones, hands toward floor. Hold momentarily. Slowly return to starting position. Repeat _5-10__ times. Band color _yellow____   Sword   On back, knees bent, feet flat, left hand on left hip, right hand above left. Pull right arm DIAGONALLY (hip to shoulder) across chest. Bring right arm along head toward floor. Hold momentarily. Slowly return to starting position. Repeat _5-10__ times. Do with left arm. Band color _yellow_____   Shoulder Rotation: Double Arm   On back, knees bent, feet flat, elbows tucked at sides, bent 90, hands palms up. Pull hands apart and down toward floor, keeping elbows near sides. Hold momentarily. Slowly return to starting position. Repeat _5-10__ times. Band color __yellow____     PELVIC TILT  Lie on back, legs bent. Exhale, tilting top of pelvis back, pubic bone up, to flatten lower back. Inhale, rolling pelvis opposite way, top forward, pubic bone down, arch in back. Repeat __10__ times. Do __2__ sessions per day. Copyright  VHI. All rights reserved.     Lie with hips and knees bent. Slowly inhale, and then exhale. Pull navel toward spine and tighten pelvic floor. Hold for __10_ seconds. Continue to breathe in and out during hold. Rest for _10__ seconds. Repeat __10_ times. Do __2-3_ times a  day.   Copyright  VHI. All rights reserved.  Knee Fold   Lie on back, legs bent, arms by sides. Exhale, lifting knee to chest. Inhale, returning. Keep abdominals flat, navel to spine. Repeat __10__ times, alternating legs. Do __2__ sessions per day.  Copyright  VHI. All rights reserved.  Knee Drop   Keep pelvis stable. Without rotating hips, slowly drop knee to side, pause, return to center, bring knee across midline toward opposite hip. Feel obliques engaging. Repeat for ___10_ times each leg.  Copyright  VHI. All rights reserved.  Isometric Hold With Pelvic Floor (Hook-Lying)    Copyright  VHI. All rights reserved.  Heel Slide to Straight   Slide one leg down to straight. Return. Be sure pelvis does not rock forward, tilt, rotate, or tip to side. Do _10__ times. Restabilize pelvis. Repeat with other leg. Do __1-2_ sets, __2_ times per day.  http://ss.exer.us/16   Copyright  VHI. All rights reserved.

## 2020-01-08 ENCOUNTER — Telehealth: Payer: Self-pay | Admitting: *Deleted

## 2020-01-08 ENCOUNTER — Telehealth: Payer: Self-pay | Admitting: Physical Therapy

## 2020-01-08 ENCOUNTER — Other Ambulatory Visit: Payer: Self-pay | Admitting: Hematology

## 2020-01-08 DIAGNOSIS — C9000 Multiple myeloma not having achieved remission: Secondary | ICD-10-CM

## 2020-01-08 MED ORDER — OXYCODONE HCL 5 MG PO TABS
5.0000 mg | ORAL_TABLET | ORAL | 0 refills | Status: DC | PRN
Start: 2020-01-08 — End: 2020-01-15

## 2020-01-08 NOTE — Telephone Encounter (Signed)
Returned call from wife who reported that pt fell yesterday stepping backward off a scale and fractured left shoulder. He is now in a sling and will see the orthopedic doctor on Thursday.  They are unsure if he will need surgery. Pt is in considerable pain at this time.  They will ask the doctor on Thursday if it is ok for him to continue PT for the rest of his body while his shoulder is healing.  Pt would like to return here for PT vs home health PT if possible.  Pt wife will call and update [place] if she will keep or cancel his future appointments. Donato Heinz. Owens Shark, PT

## 2020-01-08 NOTE — Telephone Encounter (Signed)
Patient wife called - patient fell yesterday. Was seen in ED and was found to have broken left shoulder. He is scheduled to see orthopedist on Thursday. ED did not prescribe pain med as patient has pain meds prescribed by Dr. Irene Limbo. Patient needs refilled asked patient's wife to call Dr. Irene Limbo to let him know that pt HGB and platelets are low. Dr. Irene Limbo informed of all information. Medication refilled. Contacted Ms. Pala with all information - she verbalized understanding.[name]

## 2020-01-09 ENCOUNTER — Other Ambulatory Visit: Payer: Self-pay | Admitting: *Deleted

## 2020-01-09 NOTE — Patient Outreach (Signed)
[facility]) Care Management  01/09/2020  Keishon Yarberry Sep 12, 1941 [id number]   Telephone outreach to follow up on pt fall and fx.  Spoke to Mrs. Laban today. She reports her husband had really been improving physically with PT and then he had the fall. She reports  His myeloma is in remission.  NP had made previous call in March but did not hear back from Mrs. Majik.  Today she says everything is pretty stable and she does not feel my calls are needed. She acknowledges she has appreciated the service and has my number for future needs.  I will close his case today.  Eulah Pont. Myrtie Neither, MSN, [facility] Gerontological Nurse Practitioner [facility] Care Management [phone]

## 2020-01-10 ENCOUNTER — Other Ambulatory Visit: Payer: Self-pay | Admitting: Internal Medicine

## 2020-01-10 DIAGNOSIS — S42302A Unspecified fracture of shaft of humerus, left arm, initial encounter for closed fracture: Secondary | ICD-10-CM | POA: Diagnosis not present

## 2020-01-14 ENCOUNTER — Other Ambulatory Visit (HOSPITAL_COMMUNITY)
Admission: RE | Admit: 2020-01-14 | Discharge: 2020-01-14 | Disposition: A | Discharge status: Home / Self Care | Payer: Medicare Other | Source: Ambulatory Visit | Attending: Orthopedic Surgery | Admitting: Orthopedic Surgery

## 2020-01-14 ENCOUNTER — Other Ambulatory Visit: Payer: Self-pay

## 2020-01-14 ENCOUNTER — Encounter: Payer: Medicare Other | Admitting: Physical Therapy

## 2020-01-14 ENCOUNTER — Encounter (HOSPITAL_COMMUNITY): Payer: Self-pay | Admitting: Orthopedic Surgery

## 2020-01-14 DIAGNOSIS — Z20822 Contact with and (suspected) exposure to covid-19: Secondary | ICD-10-CM | POA: Insufficient documentation

## 2020-01-14 DIAGNOSIS — Z01812 Encounter for preprocedural laboratory examination: Secondary | ICD-10-CM | POA: Diagnosis not present

## 2020-01-14 LAB — SARS CORONAVIRUS 2 (TAT 6-24 HRS): SARS Coronavirus 2: NEGATIVE

## 2020-01-14 NOTE — Progress Notes (Addendum)
SDW-pre-op call completed by pt spouse, Zora (DPR). Spouse denies that pt C/O SOB and chest pain. Spouse stated that pt is under the care of Dr. [name], Cardiology and Dr. Alain Marion, PCP. Spouse stated that pt had a stress test > 10 years ago. RECENT LABS IN Epic. Spouse made aware to have pt stop taking vitamins, fish oil and herbal medications. Do not take any NSAIDs ie: Voltaren Gel, Ibuprofen, Advil, Naproxen (Aleve), Motrin, BC and Goody Powder. Spouse stated that pt last dose of Eliquis was 01/10/20 (Evening) as advised by both surgeon and cardiologist. Please call pt spouse,  Mrs. Joni Reining if you have difficulty with pt responses or any questions ( see pre-op phone call checklist for #). Spouse is aware that pt must quarantine. Dr. Ermalene Postin, Anesthesiology, reviewed pt cardiac history; no new orders. Spouse verbalized understanding of all pre-op instructions.

## 2020-01-15 ENCOUNTER — Observation Stay (HOSPITAL_COMMUNITY)
Admission: RE | Admit: 2020-01-15 | Discharge: 2020-01-16 | Disposition: A | Discharge status: Home / Self Care | Payer: Medicare Other | Attending: Orthopedic Surgery | Admitting: Orthopedic Surgery

## 2020-01-15 ENCOUNTER — Other Ambulatory Visit: Payer: Self-pay

## 2020-01-15 ENCOUNTER — Inpatient Hospital Stay (HOSPITAL_COMMUNITY): Payer: Medicare Other

## 2020-01-15 ENCOUNTER — Inpatient Hospital Stay (HOSPITAL_COMMUNITY): Payer: Medicare Other | Admitting: Anesthesiology

## 2020-01-15 ENCOUNTER — Encounter (HOSPITAL_COMMUNITY)
Admission: RE | Disposition: A | Discharge status: Home / Self Care | Payer: Self-pay | Source: Home / Self Care | Attending: Orthopedic Surgery

## 2020-01-15 ENCOUNTER — Encounter (HOSPITAL_COMMUNITY): Payer: Self-pay | Admitting: Orthopedic Surgery

## 2020-01-15 DIAGNOSIS — D649 Anemia, unspecified: Secondary | ICD-10-CM | POA: Diagnosis not present

## 2020-01-15 DIAGNOSIS — Z7952 Long term (current) use of systemic steroids: Secondary | ICD-10-CM | POA: Insufficient documentation

## 2020-01-15 DIAGNOSIS — S42262A Displaced fracture of lesser tuberosity of left humerus, initial encounter for closed fracture: Secondary | ICD-10-CM | POA: Diagnosis not present

## 2020-01-15 DIAGNOSIS — I2510 Atherosclerotic heart disease of native coronary artery without angina pectoris: Secondary | ICD-10-CM | POA: Insufficient documentation

## 2020-01-15 DIAGNOSIS — Z79899 Other long term (current) drug therapy: Secondary | ICD-10-CM | POA: Diagnosis not present

## 2020-01-15 DIAGNOSIS — Z791 Long term (current) use of non-steroidal anti-inflammatories (NSAID): Secondary | ICD-10-CM | POA: Diagnosis not present

## 2020-01-15 DIAGNOSIS — S42232A 3-part fracture of surgical neck of left humerus, initial encounter for closed fracture: Secondary | ICD-10-CM | POA: Diagnosis not present

## 2020-01-15 DIAGNOSIS — Z859 Personal history of malignant neoplasm, unspecified: Secondary | ICD-10-CM | POA: Insufficient documentation

## 2020-01-15 DIAGNOSIS — Z888 Allergy status to other drugs, medicaments and biological substances status: Secondary | ICD-10-CM | POA: Insufficient documentation

## 2020-01-15 DIAGNOSIS — Z7901 Long term (current) use of anticoagulants: Secondary | ICD-10-CM | POA: Diagnosis not present

## 2020-01-15 DIAGNOSIS — I340 Nonrheumatic mitral (valve) insufficiency: Secondary | ICD-10-CM | POA: Insufficient documentation

## 2020-01-15 DIAGNOSIS — K219 Gastro-esophageal reflux disease without esophagitis: Secondary | ICD-10-CM | POA: Insufficient documentation

## 2020-01-15 DIAGNOSIS — M1990 Unspecified osteoarthritis, unspecified site: Secondary | ICD-10-CM | POA: Diagnosis not present

## 2020-01-15 DIAGNOSIS — I480 Paroxysmal atrial fibrillation: Secondary | ICD-10-CM | POA: Insufficient documentation

## 2020-01-15 DIAGNOSIS — Z86010 Personal history of colonic polyps: Secondary | ICD-10-CM | POA: Insufficient documentation

## 2020-01-15 DIAGNOSIS — D696 Thrombocytopenia, unspecified: Secondary | ICD-10-CM | POA: Diagnosis not present

## 2020-01-15 DIAGNOSIS — M353 Polymyalgia rheumatica: Secondary | ICD-10-CM | POA: Diagnosis not present

## 2020-01-15 DIAGNOSIS — E7800 Pure hypercholesterolemia, unspecified: Secondary | ICD-10-CM | POA: Insufficient documentation

## 2020-01-15 DIAGNOSIS — R000 Tachycardia, unspecified: Secondary | ICD-10-CM | POA: Diagnosis not present

## 2020-01-15 DIAGNOSIS — C9000 Multiple myeloma not having achieved remission: Secondary | ICD-10-CM

## 2020-01-15 DIAGNOSIS — F329 Major depressive disorder, single episode, unspecified: Secondary | ICD-10-CM | POA: Insufficient documentation

## 2020-01-15 DIAGNOSIS — I5032 Chronic diastolic (congestive) heart failure: Secondary | ICD-10-CM | POA: Insufficient documentation

## 2020-01-15 DIAGNOSIS — Z952 Presence of prosthetic heart valve: Secondary | ICD-10-CM | POA: Insufficient documentation

## 2020-01-15 DIAGNOSIS — Y939 Activity, unspecified: Secondary | ICD-10-CM | POA: Diagnosis not present

## 2020-01-15 DIAGNOSIS — Z800 Family history of malignant neoplasm of digestive organs: Secondary | ICD-10-CM | POA: Insufficient documentation

## 2020-01-15 DIAGNOSIS — K760 Fatty (change of) liver, not elsewhere classified: Secondary | ICD-10-CM | POA: Diagnosis not present

## 2020-01-15 DIAGNOSIS — S42202A Unspecified fracture of upper end of left humerus, initial encounter for closed fracture: Secondary | ICD-10-CM | POA: Diagnosis present

## 2020-01-15 DIAGNOSIS — F419 Anxiety disorder, unspecified: Secondary | ICD-10-CM | POA: Diagnosis not present

## 2020-01-15 DIAGNOSIS — Z8249 Family history of ischemic heart disease and other diseases of the circulatory system: Secondary | ICD-10-CM | POA: Insufficient documentation

## 2020-01-15 DIAGNOSIS — I110 Hypertensive heart disease with heart failure: Secondary | ICD-10-CM | POA: Diagnosis not present

## 2020-01-15 DIAGNOSIS — G8918 Other acute postprocedural pain: Secondary | ICD-10-CM | POA: Diagnosis not present

## 2020-01-15 DIAGNOSIS — W19XXXA Unspecified fall, initial encounter: Secondary | ICD-10-CM | POA: Diagnosis not present

## 2020-01-15 DIAGNOSIS — Z419 Encounter for procedure for purposes other than remedying health state, unspecified: Secondary | ICD-10-CM

## 2020-01-15 DIAGNOSIS — S42202D Unspecified fracture of upper end of left humerus, subsequent encounter for fracture with routine healing: Secondary | ICD-10-CM | POA: Diagnosis not present

## 2020-01-15 DIAGNOSIS — S42252A Displaced fracture of greater tuberosity of left humerus, initial encounter for closed fracture: Secondary | ICD-10-CM | POA: Diagnosis not present

## 2020-01-15 HISTORY — DX: Other injury of unspecified body region, initial encounter: T14.8XXA

## 2020-01-15 HISTORY — DX: Gastro-esophageal reflux disease without esophagitis: K21.9

## 2020-01-15 HISTORY — PX: ORIF HUMERUS FRACTURE: SHX2126

## 2020-01-15 SURGERY — OPEN REDUCTION INTERNAL FIXATION (ORIF) PROXIMAL HUMERUS FRACTURE
Anesthesia: General | Site: Arm Upper | Laterality: Left

## 2020-01-15 MED ORDER — DEXAMETHASONE SODIUM PHOSPHATE 10 MG/ML IJ SOLN
INTRAMUSCULAR | Status: DC | PRN
  Administered 2020-01-15: 5 mg via INTRAVENOUS

## 2020-01-15 MED ORDER — POLYETHYLENE GLYCOL 3350 17 GM/SCOOP PO POWD
17.0000 g | Freq: Two times a day (BID) | ORAL | Status: DC | PRN
  Filled 2020-01-15: qty 255

## 2020-01-15 MED ORDER — SUGAMMADEX SODIUM 200 MG/2ML IV SOLN
INTRAVENOUS | Status: DC | PRN
  Administered 2020-01-15: 200 mg via INTRAVENOUS

## 2020-01-15 MED ORDER — LORAZEPAM 0.5 MG PO TABS
0.5000 mg | ORAL_TABLET | Freq: Every day | ORAL | Status: DC
  Administered 2020-01-15: 0.5 mg via ORAL
  Filled 2020-01-15: qty 1

## 2020-01-15 MED ORDER — AMLODIPINE BESYLATE 5 MG PO TABS
5.0000 mg | ORAL_TABLET | Freq: Every day | ORAL | Status: DC
  Administered 2020-01-16: 5 mg via ORAL
  Filled 2020-01-15: qty 1

## 2020-01-15 MED ORDER — MEPERIDINE HCL 25 MG/ML IJ SOLN: 6.2500 mg | INTRAMUSCULAR | Status: DC | PRN

## 2020-01-15 MED ORDER — EPHEDRINE 5 MG/ML INJ
INTRAVENOUS | Status: AC
  Filled 2020-01-15: qty 10

## 2020-01-15 MED ORDER — FENTANYL CITRATE (PF) 250 MCG/5ML IJ SOLN
INTRAMUSCULAR | Status: AC
  Filled 2020-01-15: qty 5

## 2020-01-15 MED ORDER — MIDAZOLAM HCL 2 MG/2ML IJ SOLN
INTRAMUSCULAR | Status: AC
  Filled 2020-01-15: qty 2

## 2020-01-15 MED ORDER — ROCURONIUM BROMIDE 10 MG/ML (PF) SYRINGE
PREFILLED_SYRINGE | INTRAVENOUS | Status: DC | PRN
  Administered 2020-01-15: 50 mg via INTRAVENOUS

## 2020-01-15 MED ORDER — HYDROMORPHONE HCL 1 MG/ML IJ SOLN: 0.2500 mg | INTRAMUSCULAR | Status: DC | PRN

## 2020-01-15 MED ORDER — FAMOTIDINE 20 MG PO TABS
40.0000 mg | ORAL_TABLET | Freq: Every day | ORAL | Status: DC
  Administered 2020-01-16: 40 mg via ORAL
  Filled 2020-01-15: qty 2

## 2020-01-15 MED ORDER — SPIRONOLACTONE 25 MG PO TABS
25.0000 mg | ORAL_TABLET | Freq: Every day | ORAL | Status: DC
  Administered 2020-01-16: 25 mg via ORAL
  Filled 2020-01-15 (×2): qty 1

## 2020-01-15 MED ORDER — DULOXETINE HCL 60 MG PO CPEP
60.0000 mg | ORAL_CAPSULE | Freq: Every day | ORAL | Status: DC
  Administered 2020-01-16: 60 mg via ORAL
  Filled 2020-01-15: qty 2
  Filled 2020-01-15: qty 1

## 2020-01-15 MED ORDER — LACTATED RINGERS IV SOLN: INTRAVENOUS | Status: DC

## 2020-01-15 MED ORDER — METOCLOPRAMIDE HCL 5 MG PO TABS: 5.0000 mg | ORAL_TABLET | Freq: Three times a day (TID) | ORAL | Status: DC | PRN

## 2020-01-15 MED ORDER — FUROSEMIDE 20 MG PO TABS: 20.0000 mg | ORAL_TABLET | ORAL | Status: DC

## 2020-01-15 MED ORDER — DOCUSATE SODIUM 100 MG PO CAPS
100.0000 mg | ORAL_CAPSULE | Freq: Two times a day (BID) | ORAL | Status: DC
  Administered 2020-01-15 – 2020-01-16 (×2): 100 mg via ORAL
  Filled 2020-01-15 (×2): qty 1

## 2020-01-15 MED ORDER — OXYCODONE HCL 5 MG PO TABS: 10.0000 mg | ORAL_TABLET | ORAL | 0 refills | Status: DC | PRN

## 2020-01-15 MED ORDER — ACETAMINOPHEN 500 MG PO TABS
1000.0000 mg | ORAL_TABLET | Freq: Four times a day (QID) | ORAL | Status: DC
  Administered 2020-01-15 – 2020-01-16 (×2): 1000 mg via ORAL
  Filled 2020-01-15 (×2): qty 2

## 2020-01-15 MED ORDER — EPHEDRINE SULFATE-NACL 50-0.9 MG/10ML-% IV SOSY
PREFILLED_SYRINGE | INTRAVENOUS | Status: DC | PRN
  Administered 2020-01-15: 10 mg via INTRAVENOUS

## 2020-01-15 MED ORDER — ONDANSETRON HCL 4 MG/2ML IJ SOLN: 4.0000 mg | Freq: Four times a day (QID) | INTRAMUSCULAR | Status: DC | PRN

## 2020-01-15 MED ORDER — METOCLOPRAMIDE HCL 5 MG/ML IJ SOLN: 5.0000 mg | Freq: Three times a day (TID) | INTRAMUSCULAR | Status: DC | PRN

## 2020-01-15 MED ORDER — PROPOFOL 10 MG/ML IV BOLUS
INTRAVENOUS | Status: AC
  Filled 2020-01-15: qty 20

## 2020-01-15 MED ORDER — ONDANSETRON HCL 4 MG/2ML IJ SOLN
INTRAMUSCULAR | Status: DC | PRN
  Administered 2020-01-15: 4 mg via INTRAVENOUS

## 2020-01-15 MED ORDER — FENTANYL CITRATE (PF) 100 MCG/2ML IJ SOLN: 50.0000 ug | Freq: Once | INTRAMUSCULAR | Status: AC

## 2020-01-15 MED ORDER — ACETAMINOPHEN 325 MG PO TABS: 325.0000 mg | ORAL_TABLET | Freq: Four times a day (QID) | ORAL | Status: DC | PRN

## 2020-01-15 MED ORDER — ONDANSETRON HCL 4 MG PO TABS: 4.0000 mg | ORAL_TABLET | Freq: Four times a day (QID) | ORAL | Status: DC | PRN

## 2020-01-15 MED ORDER — 0.9 % SODIUM CHLORIDE (POUR BTL) OPTIME
TOPICAL | Status: DC | PRN
  Administered 2020-01-15: 1000 mL

## 2020-01-15 MED ORDER — AMIODARONE HCL 200 MG PO TABS
200.0000 mg | ORAL_TABLET | Freq: Every day | ORAL | Status: DC
  Administered 2020-01-16: 200 mg via ORAL
  Filled 2020-01-15: qty 1

## 2020-01-15 MED ORDER — GABAPENTIN 300 MG PO CAPS
300.0000 mg | ORAL_CAPSULE | Freq: Three times a day (TID) | ORAL | Status: DC
  Administered 2020-01-15 – 2020-01-16 (×2): 300 mg via ORAL
  Filled 2020-01-15 (×2): qty 1

## 2020-01-15 MED ORDER — ONDANSETRON HCL 4 MG/2ML IJ SOLN
INTRAMUSCULAR | Status: AC
  Filled 2020-01-15: qty 2

## 2020-01-15 MED ORDER — CEFAZOLIN SODIUM-DEXTROSE 2-4 GM/100ML-% IV SOLN
2.0000 g | INTRAVENOUS | Status: AC
  Administered 2020-01-15: 2 g via INTRAVENOUS
  Filled 2020-01-15: qty 100

## 2020-01-15 MED ORDER — POLYETHYLENE GLYCOL 3350 17 G PO PACK: 17.0000 g | PACK | Freq: Two times a day (BID) | ORAL | Status: DC | PRN

## 2020-01-15 MED ORDER — BUPIVACAINE LIPOSOME 1.3 % IJ SUSP
INTRAMUSCULAR | Status: DC | PRN
  Administered 2020-01-15: 10 mL

## 2020-01-15 MED ORDER — BUPIVACAINE HCL 0.5 % IJ SOLN
INTRAMUSCULAR | Status: DC | PRN
Start: 2020-01-15 — End: 2020-01-15
  Administered 2020-01-15: 10 mL

## 2020-01-15 MED ORDER — LIDOCAINE 2% (20 MG/ML) 5 ML SYRINGE
INTRAMUSCULAR | Status: AC
  Filled 2020-01-15: qty 5

## 2020-01-15 MED ORDER — OXYCODONE HCL 5 MG PO TABS: 10.0000 mg | ORAL_TABLET | ORAL | Status: DC | PRN

## 2020-01-15 MED ORDER — FENTANYL CITRATE (PF) 100 MCG/2ML IJ SOLN
INTRAMUSCULAR | Status: AC
  Administered 2020-01-15: 50 ug via INTRAVENOUS
  Filled 2020-01-15: qty 2

## 2020-01-15 MED ORDER — HYDROMORPHONE HCL 1 MG/ML IJ SOLN: 0.5000 mg | INTRAMUSCULAR | Status: DC | PRN

## 2020-01-15 MED ORDER — PHENYLEPHRINE HCL-NACL 10-0.9 MG/250ML-% IV SOLN
INTRAVENOUS | Status: DC | PRN
  Administered 2020-01-15: 25 ug/min via INTRAVENOUS

## 2020-01-15 MED ORDER — ACYCLOVIR 400 MG PO TABS
400.0000 mg | ORAL_TABLET | Freq: Two times a day (BID) | ORAL | Status: DC
  Administered 2020-01-15 – 2020-01-16 (×2): 400 mg via ORAL
  Filled 2020-01-15 (×3): qty 1

## 2020-01-15 MED ORDER — FENTANYL CITRATE (PF) 250 MCG/5ML IJ SOLN
INTRAMUSCULAR | Status: DC | PRN
  Administered 2020-01-15: 50 ug via INTRAVENOUS

## 2020-01-15 MED ORDER — LIDOCAINE 2% (20 MG/ML) 5 ML SYRINGE
INTRAMUSCULAR | Status: DC | PRN
  Administered 2020-01-15: 100 mg via INTRAVENOUS

## 2020-01-15 MED ORDER — PROPOFOL 10 MG/ML IV BOLUS
INTRAVENOUS | Status: DC | PRN
  Administered 2020-01-15: 120 mg via INTRAVENOUS

## 2020-01-15 MED ORDER — CEFAZOLIN SODIUM-DEXTROSE 1-4 GM/50ML-% IV SOLN
1.0000 g | Freq: Four times a day (QID) | INTRAVENOUS | Status: AC
  Administered 2020-01-15 – 2020-01-16 (×3): 1 g via INTRAVENOUS
  Filled 2020-01-15 (×2): qty 50

## 2020-01-15 MED ORDER — METOPROLOL SUCCINATE ER 25 MG PO TB24
25.0000 mg | ORAL_TABLET | Freq: Every day | ORAL | Status: DC
  Administered 2020-01-16: 25 mg via ORAL
  Filled 2020-01-15: qty 1

## 2020-01-15 MED ORDER — ALBUMIN HUMAN 5 % IV SOLN
INTRAVENOUS | Status: DC | PRN
Start: 2020-01-15 — End: 2020-01-15

## 2020-01-15 MED ORDER — TRANEXAMIC ACID-NACL 1000-0.7 MG/100ML-% IV SOLN
1000.0000 mg | INTRAVENOUS | Status: AC
  Administered 2020-01-15: 1000 mg via INTRAVENOUS
  Filled 2020-01-15: qty 100

## 2020-01-15 MED ORDER — PHENYLEPHRINE 40 MCG/ML (10ML) SYRINGE FOR IV PUSH (FOR BLOOD PRESSURE SUPPORT)
PREFILLED_SYRINGE | INTRAVENOUS | Status: DC | PRN
  Administered 2020-01-15: 120 ug via INTRAVENOUS
  Administered 2020-01-15: 80 ug via INTRAVENOUS

## 2020-01-15 MED ORDER — DEXAMETHASONE SODIUM PHOSPHATE 10 MG/ML IJ SOLN
INTRAMUSCULAR | Status: AC
  Filled 2020-01-15: qty 1

## 2020-01-15 MED ORDER — OXYCODONE HCL 5 MG PO TABS: 5.0000 mg | ORAL_TABLET | ORAL | Status: DC | PRN

## 2020-01-15 MED ORDER — ROCURONIUM BROMIDE 10 MG/ML (PF) SYRINGE
PREFILLED_SYRINGE | INTRAVENOUS | Status: AC
  Filled 2020-01-15: qty 10

## 2020-01-15 MED ORDER — PHENYLEPHRINE HCL (PRESSORS) 10 MG/ML IV SOLN
INTRAVENOUS | Status: AC
  Filled 2020-01-15: qty 1

## 2020-01-15 MED ORDER — SENNOSIDES-DOCUSATE SODIUM 8.6-50 MG PO TABS
2.0000 | ORAL_TABLET | Freq: Every day | ORAL | Status: DC
  Administered 2020-01-15: 1 via ORAL
  Filled 2020-01-15: qty 2

## 2020-01-15 MED ORDER — PHENYLEPHRINE 40 MCG/ML (10ML) SYRINGE FOR IV PUSH (FOR BLOOD PRESSURE SUPPORT)
PREFILLED_SYRINGE | INTRAVENOUS | Status: AC
  Filled 2020-01-15: qty 10

## 2020-01-15 MED ORDER — PROMETHAZINE HCL 25 MG/ML IJ SOLN: 6.2500 mg | INTRAMUSCULAR | Status: DC | PRN

## 2020-01-15 SURGICAL SUPPLY — 67 items
BIT DRILL 3.2 (BIT) ×2
BIT DRILL 3.2XCALB NS DISP (BIT) IMPLANT
BIT DRILL CALIBRATED 2.7 (BIT) ×1 IMPLANT
BIT DRL 3.2XCALB NS DISP (BIT) ×1
CLSR STERI-STRIP ANTIMIC 1/2X4 (GAUZE/BANDAGES/DRESSINGS) ×1 IMPLANT
COVER SURGICAL LIGHT HANDLE (MISCELLANEOUS) ×2 IMPLANT
COVER WAND RF STERILE (DRAPES) ×2 IMPLANT
DRAPE INCISE IOBAN 66X45 STRL (DRAPES) ×2 IMPLANT
DRAPE ORTHO SPLIT 77X108 STRL (DRAPES) ×4
DRAPE SURG ORHT 6 SPLT 77X108 (DRAPES) ×2 IMPLANT
DRAPE U-SHAPE 47X51 STRL (DRAPES) ×2 IMPLANT
DRSG AQUACEL AG ADV 3.5X 4 (GAUZE/BANDAGES/DRESSINGS) IMPLANT
DRSG AQUACEL AG ADV 3.5X 6 (GAUZE/BANDAGES/DRESSINGS) IMPLANT
DRSG PAD ABDOMINAL 8X10 ST (GAUZE/BANDAGES/DRESSINGS) ×2 IMPLANT
DURAPREP 26ML APPLICATOR (WOUND CARE) ×2 IMPLANT
ELECT REM PT RETURN 9FT ADLT (ELECTROSURGICAL) ×2
ELECTRODE REM PT RTRN 9FT ADLT (ELECTROSURGICAL) ×1 IMPLANT
GAUZE SPONGE 4X4 12PLY STRL (GAUZE/BANDAGES/DRESSINGS) ×1 IMPLANT
GLOVE BIO SURGEON STRL SZ7.5 (GLOVE) ×2 IMPLANT
GLOVE BIOGEL PI IND STRL 8 (GLOVE) ×1 IMPLANT
GLOVE BIOGEL PI INDICATOR 8 (GLOVE) ×1
GOWN STRL REUS W/ TWL LRG LVL3 (GOWN DISPOSABLE) ×1 IMPLANT
GOWN STRL REUS W/ TWL XL LVL3 (GOWN DISPOSABLE) ×1 IMPLANT
GOWN STRL REUS W/TWL LRG LVL3 (GOWN DISPOSABLE) ×2
GOWN STRL REUS W/TWL XL LVL3 (GOWN DISPOSABLE) ×2
K-WIRE 2X5 SS THRDED S3 (WIRE) ×4
KIT BASIN OR (CUSTOM PROCEDURE TRAY) ×2 IMPLANT
KIT TURNOVER KIT B (KITS) ×2 IMPLANT
KWIRE 2X5 SS THRDED S3 (WIRE) IMPLANT
MANIFOLD NEPTUNE II (INSTRUMENTS) ×2 IMPLANT
NS IRRIG 1000ML POUR BTL (IV SOLUTION) ×2 IMPLANT
PACK SHOULDER (CUSTOM PROCEDURE TRAY) ×2 IMPLANT
PAD ABD 8X10 STRL (GAUZE/BANDAGES/DRESSINGS) ×1 IMPLANT
PAD ARMBOARD 7.5X6 YLW CONV (MISCELLANEOUS) ×4 IMPLANT
PEG LOCKING 3.2MMX44 (Peg) ×2 IMPLANT
PEG LOCKING 3.2X36 (Screw) ×1 IMPLANT
PEG LOCKING 3.2X40 (Peg) ×1 IMPLANT
PEG LOCKING 3.2X50 (Screw) ×2 IMPLANT
PLATE PROX HUMERUS 4H LEFT LOW (Plate) ×1 IMPLANT
SCREW CORTICAL LOW PROF 3.5X34 (Screw) ×1 IMPLANT
SCREW LOW PROF TIS 3.5X28MM (Screw) ×1 IMPLANT
SCREW LOW PROFILE 3.5X30MM TIS (Screw) ×2 IMPLANT
SCREW T15 LP CORT 3.5X60MM NS (Screw) ×1 IMPLANT
SLEEVE MEASURING 3.2 (BIT) ×1 IMPLANT
SLING ARM FOAM STRAP LRG (SOFTGOODS) IMPLANT
SLING ARM FOAM STRAP MED (SOFTGOODS) IMPLANT
SPONGE LAP 18X18 RF (DISPOSABLE) IMPLANT
SPONGE LAP 4X18 RFD (DISPOSABLE) IMPLANT
STAPLER VISISTAT 35W (STAPLE) ×1 IMPLANT
STRIP CLOSURE SKIN 1/2X4 (GAUZE/BANDAGES/DRESSINGS) IMPLANT
SUCTION FRAZIER HANDLE 10FR (MISCELLANEOUS) ×2
SUCTION TUBE FRAZIER 10FR DISP (MISCELLANEOUS) ×1 IMPLANT
SUT FIBERWIRE #2 38 T-5 BLUE (SUTURE)
SUT MAXBRAID #2 CVD NDL (SUTURE) ×2 IMPLANT
SUT MNCRL AB 4-0 PS2 18 (SUTURE) IMPLANT
SUT VIC AB 0 CT1 27 (SUTURE) ×2
SUT VIC AB 0 CT1 27XBRD ANBCTR (SUTURE) IMPLANT
SUT VIC AB 2-0 CT1 (SUTURE) ×1 IMPLANT
SUT VIC AB 2-0 CT1 27 (SUTURE) ×2
SUT VIC AB 2-0 CT1 TAPERPNT 27 (SUTURE) ×1 IMPLANT
SUTURE FIBERWR #2 38 T-5 BLUE (SUTURE) IMPLANT
SYR CONTROL 10ML LL (SYRINGE) IMPLANT
TAPE CLOTH SURG 6X10 WHT LF (GAUZE/BANDAGES/DRESSINGS) ×1 IMPLANT
TOWEL GREEN STERILE (TOWEL DISPOSABLE) ×2 IMPLANT
TOWEL GREEN STERILE FF (TOWEL DISPOSABLE) ×2 IMPLANT
WATER STERILE IRR 1000ML POUR (IV SOLUTION) ×2 IMPLANT
YANKAUER SUCT BULB TIP NO VENT (SUCTIONS) ×2 IMPLANT

## 2020-01-15 NOTE — Brief Op Note (Signed)
01/15/2020  4:55 PM  PATIENT:  Jule Corbitt  79 y.o. male  PRE-OPERATIVE DIAGNOSIS:  Left proximal humerus fracture  POST-OPERATIVE DIAGNOSIS:  Left proximal humerus fracture  PROCEDURE:  Procedure(s): OPEN REDUCTION INTERNAL FIXATION (ORIF) PROXIMAL HUMERUS FRACTURE (Left)  SURGEON:  Surgeon(s) and Role:    * Nicholes Stairs, MD - Primary  PHYSICIAN ASSISTANT:   ASSISTANTS: Katy Apo, RNFA   ANESTHESIA:   regional and general  EBL:  200 mL   BLOOD ADMINISTERED:none  DRAINS: none   LOCAL MEDICATIONS USED:  NONE  SPECIMEN:  No Specimen  DISPOSITION OF SPECIMEN:  N/A  COUNTS:  YES  TOURNIQUET:  * No tourniquets in log *  DICTATION: .Note written in EPIC  PLAN OF CARE: Discharge to home after PACU  PATIENT DISPOSITION:  PACU - hemodynamically stable.   Delay start of Pharmacological VTE agent (>24hrs) due to surgical blood loss or risk of bleeding: not applicable

## 2020-01-15 NOTE — Anesthesia Procedure Notes (Signed)
Anesthesia Regional Block: Interscalene brachial plexus block   Pre-Anesthetic Checklist: ,, timeout performed, Correct Patient, Correct Site, Correct Laterality, Correct Procedure, Correct Position, site marked, Risks and benefits discussed,  Surgical consent,  Pre-op evaluation,  At surgeon's request and post-op pain management  Laterality: Left  Prep: chloraprep       Needles:  Injection technique: Single-shot  Needle Type: Stimulator Needle - 40     Needle Length: 4cm  Needle Gauge: 22     Additional Needles:   Procedures:,,,, ultrasound used (permanent image in chart),,,,  Narrative:  Start time: 01/15/2020 1:17 PM End time: 01/15/2020 1:22 PM Injection made incrementally with aspirations every 5 mL. Anesthesiologist: Nolon Nations, MD  Additional Notes: BP cuff, EKG monitors applied. Sedation begun. Nerve location verified with U/S. Anesthetic injected incrementally, slowly , and after neg aspirations under direct u/s guidance. Good perineural spread. Tolerated well.

## 2020-01-15 NOTE — Anesthesia Preprocedure Evaluation (Addendum)
Anesthesia Evaluation  Patient identified by MRN, date of birth, ID band Patient awake    Reviewed: Allergy & Precautions, NPO status , Patient's Chart, lab work & pertinent test results  History of Anesthesia Complications Negative for: history of anesthetic complications  Airway Mallampati: II  TM Distance: >3 FB Neck ROM: Full    Dental  (+) Teeth Intact, Dental Advisory Given   Pulmonary pneumonia,    breath sounds clear to auscultation       Cardiovascular hypertension, Pt. on home beta blockers and Pt. on medications + CAD and +CHF  + dysrhythmias Atrial Fibrillation + Valvular Problems/Murmurs (s/p TAVR)  Rhythm:Irregular Rate:Tachycardia  Echo 09/2019 1. Left ventricular ejection fraction, by visual estimation, is 60 to 65%. The left ventricle has normal function. There is moderately increased left ventricular hypertrophy.  2. The left ventricle has no regional wall motion abnormalities.  3. Global right ventricle has normal systolic function.The right ventricular size is normal. No increase in right ventricular wall thickness.  4. Left atrial size was mildly dilated. No LA appendage thrombus.  5. Right atrial size was normal.  6. No PFO/ASD noted by color doppler.  7. The tricuspid valve is normal in structure.  8. S/p valve-in-valve TAVR with bioprosthetic aortic valve. The valve was well-seated with no significant peri-valvular leakage noted and mean gradient 8 mmHg (may be underestimated).  9. The mitral valve is normal in structure. Mild mitral valve  regurgitation. No evidence of mitral stenosis.  10. The pulmonic valve was normal in structure. Pulmonic valve regurgitation is trivial.  11. Ascending aorta measured 4.5 cm. There was mild plaque in the descending thoracic aorta.    '20 RHC - Normal filling pressures and cardiac output.  Peripheral edema is likely primarily due to venous insufficiency.      Neuro/Psych PSYCHIATRIC DISORDERS Anxiety Depression negative neurological ROS     GI/Hepatic Neg liver ROS, GERD  ,  Endo/Other   Hypokalemia   Renal/GU Renal disease     Musculoskeletal  (+) Arthritis ,  Polymyalgia rheumatica    Abdominal   Peds  Hematology  (+) anemia ,  INR 1.3 Thrombocytopenia    Anesthesia Other Findings   Reproductive/Obstetrics                            Anesthesia Physical  Anesthesia Plan  ASA: III  Anesthesia Plan: General   Post-op Pain Management: GA combined w/ Regional for post-op pain   Induction: Intravenous  PONV Risk Score and Plan: 2 and Ondansetron, Dexamethasone and Treatment may vary due to age or medical condition  Airway Management Planned: Oral ETT  Additional Equipment: None  Intra-op Plan:   Post-operative Plan: Extubation in OR  Informed Consent: I have reviewed the patients History and Physical, chart, labs and discussed the procedure including the risks, benefits and alternatives for the proposed anesthesia with the patient or authorized representative who has indicated his/her understanding and acceptance.     Dental advisory given  Plan Discussed with: CRNA  Anesthesia Plan Comments:         Anesthesia Quick Evaluation

## 2020-01-15 NOTE — Op Note (Signed)
01/15/2020  4:56 PM  PATIENT:  [name] [name]    PRE-OPERATIVE DIAGNOSIS:  Left proximal humerus fracture  POST-OPERATIVE DIAGNOSIS:  Same  PROCEDURE:  OPEN REDUCTION INTERNAL FIXATION (ORIF) LEFT PROXIMAL HUMERUS FRACTURE  SURGEON:  Nicholes Stairs, MD   ASSISTANT: Katy Apo, RNFA   ANESTHESIA:   General  PREOPERATIVE INDICATIONS:  [name] [name] is a  79 y.o. male with a diagnosis of Left proximal humerus fracture who elected for surgical management.    The risks benefits and alternatives were discussed with the patient including but not limited to the risks of nonoperative treatment, versus surgical intervention including infection, bleeding, nerve injury, malunion, nonunion, the need for revision surgery, hardware prominence, hardware failure, the need for hardware removal, blood clots, cardiopulmonary complications, conversion to arthroplasty, morbidity, mortality, among others, and they were willing to proceed.  Predicted outcome is good, although there will be at least a six to nine month expected recovery.   OPERATIVE IMPLANTS: Biomet ALPS proximal humerus locking plate.  OPERATIVE FINDINGS: Displaced proximal humerus fracture.  UNIQUE ASPECTS OF THE CASE:   There was relatively nondisplaced fractures noted of the lesser tuberosity with some shortening as well as of the greater tuberosity.  The inferior half of the greater tuberosity was maximally displaced.  Making this, a three-part fracture.  Bone quality was good.  Rotator cuff was intact.  OPERATIVE PROCEDURE: The patient was brought to the operating room and placed in the supine position. General anesthesia was administered. IV antibiotics were given. He was placed in the beach chair position. All bony prominences were padded. The upper extremity was prepped and draped in usual sterile fashion. Deltopectoral incision was performed.  I exposed the fracture site, and placed deep retractors. I did not tenotomize the  biceps tendon. This was left in place. I elevated a small portion of the deltoid off of the shaft, in order to gain access for the plate. I placed supraspinatus and subscapularis stitches, and then reduced the head onto the shaft. This was maintained in satisfactory position.  I applied the plate and secured it into the sliding hole first. I confirmed position of the reduction and the plate with C-arm, and I placed a total of 2 guidewires into the appropriate position in the head. I was satisfied that the plate was distal appropriately, and then secured the plate proximally with smooth pegs, taking care to prevent penetration into the arch articular surface, using C-arm, as well as manual feel using a hand drill.  Proximally we used locking pegs in the head, and distally we used cortical, nonlocking screws.  I then secured the plate distally using another cortical screw. I then passed the FiberWire sutures from the subscapularis and supraspinatus through the plate and secured the tuberosities. Once complete fixation and reduction of been achieved, took final C-arm pictures, and irrigated the wounds copiously, and repaired the deltopectoral interval with Vicryl followed by Vicryl for the subcutaneous tissue with Monocryl and Steri-Strips for the skin. He was placed in a sling. He had a preoperative regional block as well. He tolerated the procedure well with no complications.  Patient was extubated in stable condition.  He was transported to PACU in stable condition.  All counts were correct x2.   Disposition:  The patient did quite well and will anticipate discharge home from PACU.  No noted intraoperative complications.  He will be nonweightbearing for approximately 6 weeks.  We will begin passive range of motion immediately and physical therapy.  He can  begin active motion to the elbow, hand, and wrist immediately.  He can begin active shoulder range of motion at 1 month.  I will see him back in the  office in 2 weeks.

## 2020-01-15 NOTE — H&P (Signed)
ORTHOPAEDIC H and P  REQUESTING PHYSICIAN: Nicholes Stairs, MD  PCP:  Cassandria Anger, MD  Chief Complaint: Left proximal humerus fracture  HPI: [name] [name] is a 79 y.o. male who complains of left shoulder pain and deformity following a fall back on April 26.  He is here today for operative management.  He has a proximal humerus fracture with displacement.  No new complaints since our meeting in the office.  He is ready to proceed with surgery.  Past Medical History:  Diagnosis Date  . Ascending aortic aneurysm ([place])   . Bicuspid aortic valve   . Cancer ([place])   . CHF NYHA class I (no symptoms from ordinary activities), acute, diastolic ([place])   . Fatty liver    mild  . Fracture    left proximal humerus  . GERD (gastroesophageal reflux disease)   . GI bleeding 07/21/2018   post polypectomy  . Hemorrhoids   . HTN (hypertension)   . Hypercholesteremia   . Hypokalemia   . Internal hemorrhoids   . LBP (low back pain)   . Moderate aortic stenosis   . Osteoarthritis   . Paroxysmal atrial fibrillation ([place])    a. new onset Afib in 07/2008. He underwent ibutilide cardioversion successfully. b. Recurrence 01/2013 s/p TEE/DCCV - was on Xarelto but he stopped it as he was convinced it was causing joint pn. c. Recurrence 01/2016 - spont conv to NSR. Pt took Eliquis x1 mo then declined further anticoag. d. Recurrence 07/2016.  [name] Pneumonia   . Tubular adenoma of colon    Past Surgical History:  Procedure Laterality Date  . BACK SURGERY  x12 years ago  . CARDIOVERSION N/A 01/26/2013   Procedure: CARDIOVERSION;  Surgeon: Larey Dresser, MD;  Location: [facility] ENDOSCOPY;  Service: Cardiovascular;  Laterality: N/A;  . CARDIOVERSION N/A 10/28/2017   Procedure: CARDIOVERSION;  Surgeon: Larey Dresser, MD;  Location: [facility] ENDOSCOPY;  Service: Cardiovascular;  Laterality: N/A;  . CARDIOVERSION N/A 03/03/2018   Procedure: CARDIOVERSION;  Surgeon: Lelon Perla, MD;  Location: [facility]  ENDOSCOPY;  Service: Cardiovascular;  Laterality: N/A;  . CARDIOVERSION N/A 09/19/2019   Procedure: CARDIOVERSION;  Surgeon: Larey Dresser, MD;  Location: [facility] ENDOSCOPY;  Service: Cardiovascular;  Laterality: N/A;  . COLONOSCOPY    . COLONOSCOPY  07/17/2018   at [facility]  . HEMORRHOID SURGERY    . LUMBAR LAMINECTOMY    . POLYPECTOMY    . RIGHT HEART CATH N/A 08/20/2019   Procedure: RIGHT HEART CATH;  Surgeon: Larey Dresser, MD;  Location: [place] CV LAB;  Service: Cardiovascular;  Laterality: N/A;  . RIGHT/LEFT HEART CATH AND CORONARY ANGIOGRAPHY N/A 03/07/2019   Procedure: RIGHT/LEFT HEART CATH AND CORONARY ANGIOGRAPHY;  Surgeon: Burnell Blanks, MD;  Location: [place] CV LAB;  Service: Cardiovascular;  Laterality: N/A;  . [name]  04/2019  . TEE WITHOUT CARDIOVERSION N/A 01/26/2013   Procedure: TRANSESOPHAGEAL ECHOCARDIOGRAM (TEE);  Surgeon: Larey Dresser, MD;  Location: [place];  Service: Cardiovascular;  Laterality: N/A;  . TEE WITHOUT CARDIOVERSION N/A 10/28/2017   Procedure: TRANSESOPHAGEAL ECHOCARDIOGRAM (TEE);  Surgeon: Larey Dresser, MD;  Location: [facility] ENDOSCOPY;  Service: Cardiovascular;  Laterality: N/A;  . TEE WITHOUT CARDIOVERSION N/A 05/08/2019   Procedure: TRANSESOPHAGEAL ECHOCARDIOGRAM (TEE);  Surgeon: Burnell Blanks, MD;  Location: [place] CV LAB;  Service: Open Heart Surgery;  Laterality: N/A;  . TEE WITHOUT CARDIOVERSION N/A 09/19/2019   Procedure: TRANSESOPHAGEAL ECHOCARDIOGRAM (TEE);  Surgeon: [name]  S, MD;  Location: [place];  Service: Cardiovascular;  Laterality: N/A;  . TRANSCATHETER AORTIC VALVE REPLACEMENT, TRANSFEMORAL N/A 05/08/2019   Procedure: TRANSCATHETER AORTIC VALVE REPLACEMENT, TRANSFEMORAL;  Surgeon: Burnell Blanks, MD;  Location: [place] CV LAB;  Service: Open Heart Surgery;  Laterality: N/A;   Social History   Socioeconomic History  . Marital status: Married    Spouse name: Not on file  . Number of  children: 0  . Years of education: Not on file  . Highest education level: Not on file  Occupational History  . Occupation: Retired Lobbyist: [employer]  Tobacco Use  . Smoking status: Never Smoker  . Smokeless tobacco: Never Used  Substance and Sexual Activity  . Alcohol use: Not Currently  . Drug use: No  . Sexual activity: Yes  Other Topics Concern  . Not on file  Social History Narrative   Patient lives in [place] w/ his wife. He is a native of [place]. 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   Right-handed   Caffeine: 2 cups coffee per day   Two story home   Social Determinants of Health   Financial Resource Strain:   . Difficulty of Paying Living Expenses:   Food Insecurity:   . Worried About Charity fundraiser in the Last Year:   . Arboriculturist in the Last Year:   Transportation Needs:   . Film/video editor (Medical):   [name] Lack of Transportation (Non-Medical):   Physical Activity:   . Days of Exercise per Week:   . Minutes of Exercise per Session:   Stress:   . Feeling of Stress :   Social Connections:   . Frequency of Communication with Friends and Family:   . Frequency of Social Gatherings with Friends and Family:   . Attends Religious Services:   . Active Member of Clubs or Organizations:   . Attends Archivist Meetings:   [name] Marital Status:    Family History  Problem Relation Age of Onset  . Colon cancer Mother 2  . Hypertension Other   . Coronary artery disease Neg Hx   . Colon polyps Neg Hx   . Esophageal cancer Neg Hx   . Rectal cancer Neg Hx   . Stomach cancer Neg Hx    Allergies  Allergen Reactions  . Xarelto [Rivaroxaban] Other (See Comments) and Hypertension    INCREASED BP-HYPERTENSIVE EVENTS  . Ambien [Zolpidem]     Hallucinations   . Corticosteroids Other (See Comments)    Made the patient  "sick," feel "weird," and his "body rejected" them   . Ramipril  Other (See Comments)    Could not eat or sleep, lost muscle mass  . Benazepril Cough   Prior to Admission medications   Medication Sig Start Date End Date Taking? Authorizing Provider  acyclovir (ZOVIRAX) 400 MG tablet Take 1 tablet (400 mg total) by mouth 2 (two) times daily. 08/15/19  Yes Brunetta Genera, MD  Amino Acids-Protein Hydrolys (PRO-STAT 101) LIQD Take 30 mLs by mouth daily. 10/08/19  Yes [name], [name], MD  amiodarone (PACERONE) 200 MG tablet TAKE 1 TABLET BY MOUTH EVERY DAY 01/10/20  Yes [name], [name], MD  amLODipine (NORVASC) 5 MG tablet Take 1 tablet (5 mg total) by mouth daily. 09/21/19 09/15/20 Yes Charlynne Cousins, MD  b complex vitamins tablet Take 1 tablet by mouth daily. 09/25/19  Yes [name], [name], MD  Calcium-Magnesium 500-250 MG TABS Take 1 tablet by mouth daily.   Yes [provider]  Carboxymethylcellul-Glycerin (LUBRICATING EYE DROPS OP) Place 1 drop into both eyes daily as needed (dry eyes).   Yes [provider]  Cholecalciferol (VITAMIN D) 50 MCG (2000 UT) tablet Take 2,000 Units by mouth daily.   Yes [provider]  dexamethasone (DECADRON) 4 MG tablet Take 3 tablets (12 mg total) by mouth once a week. 10/16/19  Yes [name], [name], MD  diclofenac Sodium (VOLTAREN) 1 % GEL Apply 1 application topically 4 (four) times daily as needed (pain).   Yes [provider]  DULoxetine (CYMBALTA) 60 MG capsule TAKE 1 CAPSULE BY MOUTH EVERY DAY Patient taking differently: Take 60 mg by mouth daily.  11/08/19  Yes Brunetta Genera, MD  famotidine (PEPCID) 40 MG tablet Take 1 tablet (40 mg total) by mouth daily. 01/29/19  Yes [name], [name], MD  gabapentin (NEURONTIN) 300 MG capsule Take 1 capsule (300 mg total) by mouth 3 (three) times daily. 10/02/19  Yes Barton Dubois, MD  loratadine (CLARITIN) 10 MG tablet TAKE 1 TABLET BY MOUTH EVERY DAY Patient taking differently: Take 10 mg by mouth daily.  12/02/19  Yes  [name], [name], MD  LORazepam (ATIVAN) 0.5 MG tablet Take 1 tablet (0.5 mg total) by mouth every 8 (eight) hours as needed (Nausea or vomiting). Patient taking differently: Take 0.5 mg by mouth at bedtime.  10/16/19  Yes [name], [name], MD  metoprolol succinate (TOPROL-XL) 25 MG 24 hr tablet TAKE 1 TABLET BY MOUTH TWICE A DAY Patient taking differently: Take 25 mg by mouth in the morning and at bedtime.  12/21/19  Yes Larey Dresser, MD  oxyCODONE (OXY IR/ROXICODONE) 5 MG immediate release tablet Take 1-2 tablets (5-10 mg total) by mouth every 4 (four) hours as needed for moderate pain or severe pain. 01/08/20  Yes Brunetta Genera, MD  polyethylene glycol powder (GLYCOLAX/MIRALAX) 17 GM/SCOOP powder Take 17-34 g by mouth 2 (two) times daily as needed for moderate constipation. 09/25/19  Yes [name], [name], MD  senna-docusate (SENOKOT-S) 8.6-50 MG tablet Take 2 tablets by mouth at bedtime.   Yes [provider]  spironolactone (ALDACTONE) 25 MG tablet Take 1 tablet (25 mg total) by mouth daily. 09/21/19  Yes Charlynne Cousins, MD  amoxicillin-clavulanate (AUGMENTIN) 875-125 MG tablet Take 1 tablet by mouth 2 (two) times daily. Patient not taking: Reported on 12/12/2019 11/07/19   Binnie Rail, MD  apixaban (ELIQUIS) 5 MG TABS tablet Take 5 mg by mouth 2 (two) times daily.    [provider]  ergocalciferol (VITAMIN D2) 1.25 MG (50000 UT) capsule Take 50,000 Units by mouth once a week. Every Monday    [provider]  furosemide (LASIX) 20 MG tablet TAKE 3 TABLETS BY MOUTH EVERY DAY Patient taking differently: Take 20 mg by mouth every other day.  01/07/20   Larey Dresser, MD  ondansetron (ZOFRAN) 8 MG tablet Take 1 tablet (8 mg total) by mouth 2 (two) times daily as needed (Nausea or vomiting). Patient not taking: Reported on 12/31/2019 05/17/19   Brunetta Genera, MD  prochlorperazine (COMPAZINE) 10 MG tablet Take 1 tablet (10 mg total) by mouth  every 6 (six) hours as needed (Nausea or vomiting). Patient not taking: Reported on 12/31/2019 05/17/19   Brunetta Genera, MD   No results found.  Positive ROS: All other systems have been  reviewed and were otherwise negative with the exception of those mentioned in the HPI and as above.  Physical Exam: General: Alert, no acute distress Cardiovascular: No pedal edema Respiratory: No cyanosis, no use of accessory musculature GI: No organomegaly, abdomen is soft and non-tender Skin: No lesions in the area of chief complaint Neurologic: Sensation intact distally Psychiatric: Patient is competent for consent with normal mood and affect Lymphatic: No axillary or cervical lymphadenopathy  MUSCULOSKELETAL:  Left upper extremity has some ecchymosis along the axilla.  Otherwise nontender at the elbow forearm or hand.  Mildly tender on the proximal humerus.  Neurovascular intact throughout.  Assessment: Closed left proximal humerus fracture with displacement  Plan: -Plan is for open reduction internal fixation of the left proximal humerus.  We again reviewed the risk and benefits of this procedure as well as the indications for surgery.  He has provided informed consent.  - The risks, benefits, and alternatives were discussed with the patient. There are risks associated with the surgery including, but not limited to, problems with anesthesia (death), infection, differences in leg length/angulation/rotation, fracture of bones, loosening or failure of implants, malunion, nonunion, hematoma (blood accumulation) which may require surgical drainage, blood clots, pulmonary embolism, nerve injury, and blood vessel injury. The patient understands these risks and elects to proceed.  -If his interscalene block provides adequate pain relief we may consider discharge home postoperatively from PACU.  Otherwise we will keep him overnight for pain control and monitoring.   Nicholes Stairs, MD Cell  [phone]    01/15/2020 12:29 PM

## 2020-01-15 NOTE — Discharge Instructions (Signed)
-  Maintain your arm in the sling for comfort.  You may remove your arm for activities of daily living such as getting dressed or bathing.  Otherwise no lifting with the left arm.  No motion through the shoulder with the left arm.  You may move your hand, wrist, and elbow as tolerated.  -Maintain your postoperative bandage for 3 days.  You may remove this on Friday and begin showering at that time.  But do not submerge underwater.  You should then apply a standard dry dressing over the shoulder incision in between showers.  -He should expect mild to moderate drainage from your postoperative bandage.  -You should apply ice to the left shoulder for 30 minutes at a time throughout the day as able every hour.  -For mild to moderate pain use Tylenol and ice around-the-clock.  For breakthrough pain use oxycodone as necessary every 4 hours.  -You may resume your Eliquis dosing beginning tomorrow, as typically directed by your physician.  -Return to see Dr. Stann Mainland in 2 weeks for routine care.

## 2020-01-15 NOTE — Progress Notes (Signed)
Patient on 3C room 2. Wife was arriving to unit when I left. Receiving RN at bedside.  Rowe Pavy, RN

## 2020-01-15 NOTE — Transfer of Care (Signed)
Immediate Anesthesia Transfer of Care Note  Patient: [name] [name]  Procedure(s) Performed: OPEN REDUCTION INTERNAL FIXATION (ORIF) PROXIMAL HUMERUS FRACTURE (Left Arm Upper)  Patient Location: PACU  Anesthesia Type:GA combined with regional for post-op pain  Level of Consciousness: drowsy and patient cooperative  Airway & Oxygen Therapy: Patient Spontanous Breathing and Patient connected to face mask oxygen  Post-op Assessment: Report given to RN and Post -op Vital signs reviewed and stable  Post vital signs: Reviewed and stable  Last Vitals:  Vitals Value Taken Time  BP 139/67 01/15/20 1647  Temp 36.3 C 01/15/20 1647  Pulse 50 01/15/20 1650  Resp 16 01/15/20 1650  SpO2 100 % 01/15/20 1650  Vitals shown include unvalidated device data.  Last Pain:  Vitals:   01/15/20 1440  TempSrc:   PainSc: 0-No pain      Patients Stated Pain Goal: 2 ([id number])  Complications: No apparent anesthesia complications

## 2020-01-15 NOTE — Anesthesia Postprocedure Evaluation (Signed)
Anesthesia Post Note  Patient: [name] [name]  Procedure(s) Performed: OPEN REDUCTION INTERNAL FIXATION (ORIF) PROXIMAL HUMERUS FRACTURE (Left Arm Upper)     Patient location during evaluation: PACU Anesthesia Type: General Level of consciousness: sedated and patient cooperative Pain management: pain level controlled Vital Signs Assessment: post-procedure vital signs reviewed and stable Respiratory status: spontaneous breathing Cardiovascular status: stable Anesthetic complications: no    Last Vitals:  Vitals:   01/15/20 1756 01/15/20 1938  BP: 113/67 (!) 100/57  Pulse: (!) 54 (!) 57  Resp: 18 18  Temp:  36.6 C  SpO2: 99% 96%    Last Pain:  Vitals:   01/15/20 1940  TempSrc:   PainSc: 0-No pain                 Nolon Nations

## 2020-01-15 NOTE — Anesthesia Procedure Notes (Signed)
Procedure Name: Intubation Date/Time: 01/15/2020 3:31 PM Performed by: Kathryne Hitch, CRNA Pre-anesthesia Checklist: Patient identified, Emergency Drugs available, Suction available and Patient being monitored Patient Re-evaluated:Patient Re-evaluated prior to induction Oxygen Delivery Method: Circle system utilized Preoxygenation: Pre-oxygenation with 100% oxygen Induction Type: IV induction Ventilation: Mask ventilation without difficulty Laryngoscope Size: Miller and 3 Grade View: Grade I Tube type: Oral Tube size: 7.5 mm Number of attempts: 1 Airway Equipment and Method: Stylet and Oral airway Placement Confirmation: ETT inserted through vocal cords under direct vision,  positive ETCO2 and breath sounds checked- equal and bilateral Secured at: 22 cm Tube secured with: Tape Dental Injury: Teeth and Oropharynx as per pre-operative assessment

## 2020-01-16 ENCOUNTER — Encounter: Payer: Medicare Other | Admitting: Physical Therapy

## 2020-01-16 DIAGNOSIS — S42252A Displaced fracture of greater tuberosity of left humerus, initial encounter for closed fracture: Secondary | ICD-10-CM | POA: Diagnosis not present

## 2020-01-16 DIAGNOSIS — E7800 Pure hypercholesterolemia, unspecified: Secondary | ICD-10-CM | POA: Diagnosis not present

## 2020-01-16 DIAGNOSIS — I110 Hypertensive heart disease with heart failure: Secondary | ICD-10-CM | POA: Diagnosis not present

## 2020-01-16 DIAGNOSIS — I480 Paroxysmal atrial fibrillation: Secondary | ICD-10-CM | POA: Diagnosis not present

## 2020-01-16 DIAGNOSIS — I5032 Chronic diastolic (congestive) heart failure: Secondary | ICD-10-CM | POA: Diagnosis not present

## 2020-01-16 DIAGNOSIS — S42262A Displaced fracture of lesser tuberosity of left humerus, initial encounter for closed fracture: Secondary | ICD-10-CM | POA: Diagnosis not present

## 2020-01-16 NOTE — Progress Notes (Signed)
Occupational Therapy Evaluation Patient Details Name: [name] [name] MRN: [id number] DOB: 27-Aug-1941 Today's Date: 01/16/2020    History of Present Illness Pt is a 79 yo male s/p fall resulting in L proximal humeral fx. Pt s/p R ORIF proximal humerus. PMHx: back sx.   Clinical Impression   Pt PTA: Pt living with very supportive spouse who assists pt as needed due to recent falls. Pt reports nearly independent with ADL and mobility with SPC. Pt currently limited by decreased use of LUE; NWB LUE; shoulder PROM to 75* FF; elbow through hand, WFLs. Pt given LUE HEP for elbow, wrist and hand with good carry over skills and handout provided. Pt donning/doffing brace with minA and dressing self with minA overall.  Shoulder precautions ( handout provided): Educated patient on don doff brace with return demonstration, washing under arms with cloth, never to wash directly on incision site, avoid shoulder movement. positioning with pillows in chair for , sleeping positioning (recommend recliner if possible), pt educated on dressing care during bathing/ dressing.  Home exercise program provided by OT per MD instructions. Pt would benefit from follow-up OP OT. Pt d/c from acute OT. All education complete.        Follow Up Recommendations  Outpatient OT;Follow surgeon's recommendation for DC plan and follow-up therapies(Pt plans to return to OP therapy.)    Equipment Recommendations  None recommended by OT    Recommendations for Other Services       Precautions / Restrictions Precautions Precautions: Shoulder Type of Shoulder Precautions: AROM elbow, wrist and hand start immediately; PROM to shoulder immediately. AROM of shoulder to begin at 1 month. NWB x6 weeks. Shoulder Interventions: Shoulder sling/immobilizer Precaution Booklet Issued: Yes (comment) Precaution Comments: AROM of elbow., wrist and hand performed for HEP with handout provided. Pt NWB LUE. Required Braces or Orthoses:  Sling Restrictions Weight Bearing Restrictions: Yes LUE Weight Bearing: Non weight bearing      Mobility Bed Mobility Overal bed mobility: Needs Assistance Bed Mobility: Supine to Sit     Supine to sit: Min assist;HOB elevated     General bed mobility comments: Pt using BLEs to anchor self at EOB with minA to elevate trunk  Transfers Overall transfer level: Needs assistance Equipment used: Straight cane Transfers: Sit to/from Stand Sit to Stand: Min guard              Balance Overall balance assessment: Mild deficits observed, not formally tested                                         ADL either performed or assessed with clinical judgement   ADL Overall ADL's : Needs assistance/impaired Eating/Feeding: Set up;Sitting Eating/Feeding Details (indicate cue type and reason): assist to cut french toast Grooming: Set up;Standing   Upper Body Bathing: Minimal assistance;Standing   Lower Body Bathing: Minimal assistance;Sitting/lateral leans;Sit to/from stand   Upper Body Dressing : Minimal assistance;Sitting   Lower Body Dressing: Minimal assistance;Sitting/lateral leans;Sit to/from stand;Cueing for safety   Toilet Transfer: Min guard;Ambulation;Regular Toilet   Toileting- Clothing Manipulation and Hygiene: Minimal assistance;Sitting/lateral lean;Sit to/from stand       Functional mobility during ADLs: Min guard;Cane General ADL Comments: Pt requiring assist for LUE restricted and to pull pants to waist on L side.     Vision Baseline Vision/History: No visual deficits Patient Visual Report: No change from baseline Vision Assessment?: No apparent visual  deficits     Perception     Praxis      Pertinent Vitals/Pain Pain Assessment: 0-10 Pain Score: 0-No pain Pain Intervention(s): Monitored during session;Repositioned;Ice applied     Hand Dominance Right   Extremity/Trunk Assessment Upper Extremity Assessment Upper Extremity  Assessment: LUE deficits/detail LUE Deficits / Details: tremors; NWB LUE; shoulder PROM to 75* FF; elbow through hand, WFLs LUE Coordination: decreased fine motor;decreased gross motor   Lower Extremity Assessment Lower Extremity Assessment: Generalized weakness;Defer to PT evaluation   Cervical / Trunk Assessment Cervical / Trunk Assessment: Normal   Communication Communication Communication: No difficulties   Cognition Arousal/Alertness: Awake/alert Behavior During Therapy: WFL for tasks assessed/performed Overall Cognitive Status: Within Functional Limits for tasks assessed                                     General Comments       Exercises     Shoulder Instructions      Home Living Family/patient expects to be discharged to:: Private residence Living Arrangements: Spouse/significant other Available Help at Discharge: Family Type of Home: House Home Access: Stairs to enter Technical brewer of Steps: 2 Entrance Stairs-Rails: Right Home Layout: Two level;Bed/bath upstairs Alternate Level Stairs-Number of Steps: 13 Alternate Level Stairs-Rails: Right Bathroom Shower/Tub: Tub/shower unit;Walk-in shower   Bathroom Toilet: Standard     Home Equipment: Cane - single point;Toilet riser;Grab bars - toilet          Prior Functioning/Environment Level of Independence: Needs assistance  Gait / Transfers Assistance Needed: Pt using SPC, supervised by spouse ADL's / Homemaking Assistance Needed: Pt was mostly independent with ADL tasks, but spouse is available to assist; IADLs provided for pt            OT Problem List: Decreased strength;Decreased range of motion;Decreased activity tolerance;Impaired balance (sitting and/or standing);Pain;Increased edema;Impaired UE functional use      OT Treatment/Interventions:      OT Goals(Current goals can be found in the care plan section) Acute Rehab OT Goals Patient Stated Goal: to go home OT Goal  Formulation: With patient  OT Frequency:     Barriers to D/C:            Co-evaluation              AM-PAC OT "6 Clicks" Daily Activity     Outcome Measure Help from another person eating meals?: A Little Help from another person taking care of personal grooming?: A Little Help from another person toileting, which includes using toliet, bedpan, or urinal?: A Little Help from another person bathing (including washing, rinsing, drying)?: A Little Help from another person to put on and taking off regular upper body clothing?: A Little Help from another person to put on and taking off regular lower body clothing?: A Little 6 Click Score: 18   End of Session Equipment Utilized During Treatment: Other (comment)(sling) Nurse Communication: Mobility status  Activity Tolerance: Patient tolerated treatment well Patient left: in bed;with call bell/phone within reach  OT Visit Diagnosis: Repeated falls (R29.6);Pain;Muscle weakness (generalized) (M62.81) Pain - Right/Left: Left Pain - part of body: Shoulder                Time: [id number] OT Time Calculation (min): 43 min Charges:  OT General Charges $OT Visit: 1 Visit OT Evaluation $OT Eval Moderate Complexity: 1 Mod OT Treatments $Self Care/Home Management : 23-37 mins  Jefferey Pica, OTR/L Acute Rehabilitation Services Pager: [phone] Office: [phone]  '  Khiara Shuping C 01/16/2020, 8:31 AM

## 2020-01-16 NOTE — Evaluation (Addendum)
Physical Therapy Evaluation Patient Details Name: Delton Windley MRN: [id number] DOB: [birth year] Today's Date: 01/16/2020   History of Present Illness  Pt is a 79 yo male s/p fall resulting in L proximal humeral fx. Pt s/p R ORIF proximal humerus. PMHx: back sx, HTN, CHF and cancer.  Clinical Impression  Pt presented sitting upright at EOB, awake and willing to participate in therapy session. Pt's wife was present throughout session as well. Prior to admission, pt reported that his spouse assisted him with transfers and stairs and was independent with ADLs. At the time of evaluation, pt required min A for transfers with use of his personal single point cane. Additionally he required min A for stair training with close min guard with use of cane to ambulate in hallway. Pt with one LOB requiring min A to recover. Pt's spouse present and reporting that she will be able to continue to provide the support and assistance pt will need. Pt would continue to benefit from skilled physical therapy services at this time while admitted and after d/c to address the below listed limitations in order to improve overall safety and independence with functional mobility.     Follow Up Recommendations Outpatient PT;Supervision/Assistance - 24 hour    Equipment Recommendations  None recommended by PT    Recommendations for Other Services       Precautions / Restrictions Precautions Precautions: Shoulder Type of Shoulder Precautions: AROM elbow, wrist and hand start immediately; PROM to shoulder immediately. AROM of shoulder to begin at 1 month. NWB x6 weeks. Shoulder Interventions: Shoulder sling/immobilizer Precaution Booklet Issued: Yes (comment) Precaution Comments: AROM of elbow., wrist and hand performed for HEP with handout provided. Pt NWB LUE. Required Braces or Orthoses: Sling Restrictions Weight Bearing Restrictions: Yes LUE Weight Bearing: Non weight bearing      Mobility  Bed Mobility Overal  bed mobility: Needs Assistance Bed Mobility: Supine to Sit     Supine to sit: Min assist;HOB elevated     General bed mobility comments: pt seated EOB upon arrival  Transfers Overall transfer level: Needs assistance Equipment used: Straight cane Transfers: Sit to/from Stand Sit to Stand: Min assist         General transfer comment: increased time and effort, use of momentum; assistance needed to fully achieve upright standing and for balance  Ambulation/Gait Ambulation/Gait assistance: Min guard;Min assist Gait Distance (Feet): 100 Feet Assistive device: Straight cane Gait Pattern/deviations: Step-through pattern;Decreased step length - right;Decreased step length - left;Decreased stride length Gait velocity: decreased   General Gait Details: pt with slow, cautious and guarded gait with modest instability requiring close CGA throughout. One minor LOB requiring min A to maintain upright position  Stairs Stairs: Yes Stairs assistance: Min assist Stair Management: One rail Right;Step to pattern;Forwards Number of Stairs: 2 General stair comments: pt's wife present throughout and reporting that she assists him with ascending/descending steps with use of gait belt  Wheelchair Mobility    Modified Rankin (Stroke Patients Only)       Balance Overall balance assessment: Needs assistance Sitting-balance support: Feet supported Sitting balance-Leahy Scale: Good     Standing balance support: During functional activity;No upper extremity supported;Single extremity supported Standing balance-Leahy Scale: Poor                               Pertinent Vitals/Pain Pain Assessment: Faces Pain Score: 0-No pain Faces Pain Scale: No hurt Pain Intervention(s): Monitored during session  Home Living Family/patient expects to be discharged to:: Private residence Living Arrangements: Spouse/significant other Available Help at Discharge: Family Type of Home:  House Home Access: Stairs to enter Entrance Stairs-Rails: Right Entrance Stairs-Number of Steps: 2 Home Layout: Two level;Bed/bath upstairs Home Equipment: Cane - single point;Toilet riser;Grab bars - toilet      Prior Function Level of Independence: Needs assistance   Gait / Transfers Assistance Needed: Pt using SPC, supervised by spouse - wife uses a gait belt and assists him with transfers and stairs  ADL's / Homemaking Assistance Needed: Pt was mostly independent with ADL tasks, but spouse is available to assist; IADLs provided for pt        Hand Dominance   Dominant Hand: Right    Extremity/Trunk Assessment   Upper Extremity Assessment Upper Extremity Assessment: Defer to OT evaluation LUE Deficits / Details: tremors; NWB LUE; shoulder PROM to 75* FF; elbow through hand, WFLs LUE Coordination: decreased fine motor;decreased gross motor    Lower Extremity Assessment Lower Extremity Assessment: Generalized weakness    Cervical / Trunk Assessment Cervical / Trunk Assessment: Normal  Communication   Communication: No difficulties  Cognition Arousal/Alertness: Awake/alert Behavior During Therapy: WFL for tasks assessed/performed Overall Cognitive Status: Within Functional Limits for tasks assessed                                        General Comments      Exercises     Assessment/Plan    PT Assessment Patient needs continued PT services  PT Problem List Decreased strength;Decreased balance;Decreased mobility;Decreased coordination;Decreased knowledge of use of DME;Decreased safety awareness;Decreased knowledge of precautions       PT Treatment Interventions DME instruction;Functional mobility training;Gait training;Stair training;Therapeutic activities;Therapeutic exercise;Balance training;Neuromuscular re-education;Patient/family education    PT Goals (Current goals can be found in the Care Plan section)  Acute Rehab PT Goals Patient  Stated Goal: "home today" PT Goal Formulation: With patient/family Time For Goal Achievement: 01/30/20 Potential to Achieve Goals: Good    Frequency Min 5X/week   Barriers to discharge        Co-evaluation               AM-PAC PT "6 Clicks" Mobility  Outcome Measure Help needed turning from your back to your side while in a flat bed without using bedrails?: None Help needed moving from lying on your back to sitting on the side of a flat bed without using bedrails?: A Little Help needed moving to and from a bed to a chair (including a wheelchair)?: A Little Help needed standing up from a chair using your arms (e.g., wheelchair or bedside chair)?: A Little Help needed to walk in hospital room?: A Little Help needed climbing 3-5 steps with a railing? : A Little 6 Click Score: 19    End of Session Equipment Utilized During Treatment: Gait belt;Other (comment)(L UE sling) Activity Tolerance: Patient tolerated treatment well Patient left: with call bell/phone within reach;with family/visitor present;Other (comment)(in bathroom) Nurse Communication: Mobility status PT Visit Diagnosis: Other abnormalities of gait and mobility (R26.89)    Time: [id number] PT Time Calculation (min) (ACUTE ONLY): 18 min   Charges:   PT Evaluation $PT Eval Low Complexity: 1 Low          Eduard Clos, PT, DPT  Acute Rehabilitation Services Pager [phone] Office [place] 01/16/2020, 10:39 AM

## 2020-01-16 NOTE — TOC Initial Note (Signed)
Transition of Care [facility]) - Initial/Assessment Note    Patient Details  Name: [name] [name] MRN: [id number] Date of Birth: Feb 23, 1941  Transition of Care [facility]) CM/SW Contact:    Benard Halsted, LCSW Phone Number: 01/16/2020, 8:40 AM  Clinical Narrative:                 Patient discharging home with spouse. No needs identified at this time.   Expected Discharge Plan: Home/Self Care Barriers to Discharge: No Barriers Identified   Patient Goals and CMS Choice        Expected Discharge Plan and Services Expected Discharge Plan: Home/Self Care       Living arrangements for the past 2 months: Single Family Home Expected Discharge Date: 01/16/20                                    Prior Living Arrangements/Services Living arrangements for the past 2 months: Single Family Home Lives with:: Spouse Patient language and need for interpreter reviewed:: Yes Do you feel safe going back to the place where you live?: Yes      Need for Family Participation in Patient Care: No (Comment) Care giver support system in place?: Yes (comment)   Criminal Activity/Legal Involvement Pertinent to Current Situation/Hospitalization: No - Comment as needed  Activities of Daily Living Home Assistive Devices/Equipment: Blood pressure cuff, Cane (specify quad or straight), Eyeglasses, Scales(straight cane) ADL Screening (condition at time of admission) Patient's cognitive ability adequate to safely complete daily activities?: Yes Is the patient deaf or have difficulty hearing?: No Does the patient have difficulty seeing, even when wearing glasses/contacts?: No Does the patient have difficulty concentrating, remembering, or making decisions?: No Patient able to express need for assistance with ADLs?: Yes Does the patient have difficulty dressing or bathing?: Yes(showers himself but wife dries him off) Independently performs ADLs?: No Communication: Independent Dressing (OT): Needs  assistance Is this a change from baseline?: Pre-admission baseline Grooming: Needs assistance Is this a change from baseline?: Pre-admission baseline Feeding: Independent Bathing: Independent Toileting: Independent In/Out Bed: Needs assistance Is this a change from baseline?: Pre-admission baseline Walks in Home: Independent with device (comment)(straight cane) Does the patient have difficulty walking or climbing stairs?: Yes Weakness of Legs: None(unsteady but no weakness per wife) Weakness of Arms/Hands: None  Permission Sought/Granted                  Emotional Assessment Appearance:: Appears stated age     Orientation: : Oriented to Place, Oriented to  Time, Oriented to Situation, Oriented to Self Alcohol / Substance Use: Not Applicable Psych Involvement: No (comment)  Admission diagnosis:  Closed fracture of left proximal humerus [S42.202A] Patient Active Problem List   Diagnosis Date Noted  . Closed fracture of left proximal humerus 01/15/2020  . Acute cystitis without hematuria 11/07/2019  . Left inguinal hernia 11/07/2019  . Physical deconditioning   . Depression with anxiety   . Acute respiratory failure with hypoxia ([place])   . Ischemic colitis ([place]) 09/18/2019  . Pressure injury of skin 09/16/2019  . Bloody diarrhea 09/14/2019  . Abdominal pain, diffuse 09/14/2019  . Acute metabolic encephalopathy 98/07/9146  . Goals of care, counseling/discussion 09/14/2019  . DNR (do not resuscitate) 09/14/2019  . Peripheral neuropathy 09/13/2019  . Gait disorder 09/13/2019  . Multiple myeloma not having achieved remission ([place]) 05/17/2019  . Counseling regarding advance care planning and goals of care 05/17/2019  .  Bone metastases ([place]) 05/17/2019  . Postoperative fever 05/09/2019  . S/P TAVR (transcatheter aortic valve replacement) 05/08/2019  . Multiple myeloma ([place]) 05/08/2019  . Acute on chronic diastolic heart failure ([place]) 05/08/2019  . Severe aortic stenosis    . Polymyalgia rheumatica syndrome ([place]) 01/29/2019  . CAD (coronary artery disease) 08/01/2018  . Hematochezia 07/27/2018  . HTN (hypertension) 02/28/2018  . Hypercholesteremia 02/28/2018  . Degeneration of lumbar intervertebral disc 12/06/2017  . Adenomatous polyp of ascending colon 12/02/2017  . Chronic low back pain 09/26/2017  . Mastoiditis 03/21/2017  . Abnormal TSH 12/23/2016  . PNA (pneumonia)   . AKI (acute kidney injury) ([place])   . Ascending aortic aneurysm ([place]) 02/15/2013  . Atrial fibrillation with RVR ([place]) 01/23/2013  . Well adult exam 05/31/2012  . PAF (paroxysmal atrial fibrillation) ([place]) 08/05/2008  . HLD (hyperlipidemia) 04/08/2007  . Essential hypertension 04/08/2007   PCP:  Cassandria Anger, MD Pharmacy:   [facility] [place], [place] - [address] AT [address] [place] [address] Phone: [phone] Fax: [phone]  [id number] [place] [address]Phone: [phone]: [phone] [facility] [place]- [address]Phone: [phone]: [phone] [facility] [place] - [address]Phone: [phone]: [phone]    Social Determinants of Health ([place] Interventions    Readmission Risk Interventions Readmission Risk Prevention Plan 01/16/2020 09/20/2019  Transportation Screening Complete Complete  Medication Review (Press photographer Complete Complete  PCP or Specialist appointment within 3-5 days of discharge Complete Complete  HRI or [place] Complete  SW Recovery Care/Counseling Consult Complete Complete  Palliative Care Screening Not Applicable Not ATurneyNot Applicable Not Applicable   Some recent data might be hidden

## 2020-01-16 NOTE — Plan of Care (Signed)
Patient alert and oriented, mae's well, voiding adequate amount of urine, swallowing without difficulty, no c/o pain at time of discharge. Patient discharged home with family. Script and discharged instructions given to patient. Patient and family stated understanding of instructions given. Patient has an appointment with Dr. Rogers   

## 2020-01-18 NOTE — Discharge Summary (Signed)
Patient ID: [name] [name] MRN: [id number] DOB/AGE: [birth year] 80 y.o.  Admit date: 01/15/2020 Discharge date: 01/16/2020  Primary Diagnosis: Left proximal humerus fracture Admission Diagnoses:  Past Medical History:  Diagnosis Date  . Ascending aortic aneurysm ([place])   . Bicuspid aortic valve   . Cancer ([place])   . CHF NYHA class I (no symptoms from ordinary activities), acute, diastolic ([place])   . Fatty liver    mild  . Fracture    left proximal humerus  . GERD (gastroesophageal reflux disease)   . GI bleeding 07/21/2018   post polypectomy  . Hemorrhoids   . HTN (hypertension)   . Hypercholesteremia   . Hypokalemia   . Internal hemorrhoids   . LBP (low back pain)   . Moderate aortic stenosis   . Osteoarthritis   . Paroxysmal atrial fibrillation ([place])    a. new onset Afib in 07/2008. He underwent ibutilide cardioversion successfully. b. Recurrence 01/2013 s/p TEE/DCCV - was on Xarelto but he stopped it as he was convinced it was causing joint pn. c. Recurrence 01/2016 - spont conv to NSR. Pt took Eliquis x1 mo then declined further anticoag. d. Recurrence 07/2016.  [name] Pneumonia   . Tubular adenoma of colon    Discharge Diagnoses:   Active Problems:   Closed fracture of left proximal humerus  Estimated body mass index is 22.59 kg/m as calculated from the following:   Height as of this encounter: '5\' 11"'$  (1.803 m).   Weight as of this encounter: 73.5 kg.  Procedure:  Procedure(s) (LRB): OPEN REDUCTION INTERNAL FIXATION (ORIF) PROXIMAL HUMERUS FRACTURE (Left)   Consults: None  HPI: Admitted to the hospital for an operatively managed proximal humerus fracture on the left side.  He required one overnight stay. Laboratory Data: Hospital Outpatient Visit on 01/14/2020  Component Date Value Ref Range Status  . SARS Coronavirus 2 01/14/2020 NEGATIVE  NEGATIVE Final   Comment: (NOTE) SARS-CoV-2 target nucleic acids are NOT DETECTED. The SARS-CoV-2 RNA is generally detectable in  upper and lower respiratory specimens during the acute phase of infection. Negative results do not preclude SARS-CoV-2 infection, do not rule out co-infections with other pathogens, and should not be used as the sole basis for treatment or other patient management decisions. Negative results must be combined with clinical observations, patient history, and epidemiological information. The expected result is Negative. Fact Sheet for Patients: SugarRoll.be Fact Sheet for Healthcare Providers: https://www.woods-mathews.com/ This test is not yet approved or cleared by the [place] FDA and  has been authorized for detection and/or diagnosis of SARS-CoV-2 by FDA under an Emergency Use Authorization (EUA). This EUA will remain  in effect (meaning this test can be used) for the duration of the COVID-19 declaration under Section 56                          4(b)(1) of the Act, 21 U.S.C. section 360bbb-3(b)(1), unless the authorization is terminated or revoked sooner. Performed at [facility], [place] [address], [place], [place] [address]   Admission on 01/07/2020, Discharged on 01/08/2020  Component Date Value Ref Range Status  . WBC 01/07/2020 9.7  4.0 - 10.5 K/uL Final  . RBC 01/07/2020 4.00* 4.22 - 5.81 MIL/uL Final  . Hemoglobin 01/07/2020 12.3* 13.0 - 17.0 g/dL Final  . HCT 01/07/2020 38.5* 39.0 - 52.0 % Final  . MCV 01/07/2020 96.3  80.0 - 100.0 fL Final  . MCH 01/07/2020 30.8  26.0 - 34.0  pg Final  . MCHC 01/07/2020 31.9  30.0 - 36.0 g/dL Final  . RDW 01/07/2020 13.6  11.5 - 15.5 % Final  . Platelets 01/07/2020 122* 150 - 400 K/uL Final  . nRBC 01/07/2020 0.0  0.0 - 0.2 % Final   Performed at [facility], [place] [address] [place], [place] [address]  . Sodium 01/07/2020 139  135 - 145 mmol/L Final  . Potassium 01/07/2020 4.5  3.5 - 5.1 mmol/L Final  . Chloride 01/07/2020 105  98 - 111 mmol/L Final  . CO2 01/07/2020 27  22 - 32  mmol/L Final  . Glucose, Bld 01/07/2020 114* 70 - 99 mg/dL Final   Glucose reference range applies only to samples taken after fasting for at least 8 hours.  . BUN 01/07/2020 22  8 - 23 mg/dL Final  . Creatinine, Ser 01/07/2020 1.22  0.61 - 1.24 mg/dL Final  . Calcium 01/07/2020 9.1  8.9 - 10.3 mg/dL Final  . GFR calc non Af Amer 01/07/2020 56* >60 mL/min Final  . GFR calc Af Amer 01/07/2020 >60  >60 mL/min Final  . Anion gap 01/07/2020 7  5 - 15 Final   Performed at [facility], [place] [address], [place], [place] [address]  . Prothrombin Time 01/07/2020 14.9  11.4 - 15.2 seconds Final  . INR 01/07/2020 1.2  0.8 - 1.2 Final   Comment: (NOTE) INR goal varies based on device and disease states. Performed at [facility], [place] [address] [place], [place] [address]   Appointment on 12/12/2019  Component Date Value Ref Range Status  . WBC 12/12/2019 17.7* 4.0 - 10.5 K/uL Final  . RBC 12/12/2019 3.96* 4.22 - 5.81 MIL/uL Final  . Hemoglobin 12/12/2019 12.2* 13.0 - 17.0 g/dL Final  . HCT 12/12/2019 37.5* 39.0 - 52.0 % Final  . MCV 12/12/2019 94.7  80.0 - 100.0 fL Final  . MCH 12/12/2019 30.8  26.0 - 34.0 pg Final  . MCHC 12/12/2019 32.5  30.0 - 36.0 g/dL Final  . RDW 12/12/2019 14.2  11.5 - 15.5 % Final  . Platelets 12/12/2019 153  150 - 400 K/uL Final  . nRBC 12/12/2019 0.0  0.0 - 0.2 % Final  . Neutrophils Relative % 12/12/2019 87  % Final  . Neutro Abs 12/12/2019 15.2* 1.7 - 7.7 K/uL Final  . Lymphocytes Relative 12/12/2019 5  % Final  . Lymphs Abs 12/12/2019 0.9  0.7 - 4.0 K/uL Final  . Monocytes Relative 12/12/2019 7  % Final  . Monocytes Absolute 12/12/2019 1.3* 0.1 - 1.0 K/uL Final  . Eosinophils Relative 12/12/2019 0  % Final  . Eosinophils Absolute 12/12/2019 0.0  0.0 - 0.5 K/uL Final  . Basophils Relative 12/12/2019 0  % Final  . Basophils Absolute 12/12/2019 0.0  0.0 - 0.1 K/uL Final  . Immature Granulocytes 12/12/2019 1  % Final  . Abs Immature Granulocytes  12/12/2019 0.23* 0.00 - 0.07 K/uL Final   Performed at [facility], [place] [address], [place], [place] [address]  . Sodium 12/12/2019 140  135 - 145 mmol/L Final  . Potassium 12/12/2019 4.0  3.5 - 5.1 mmol/L Final  . Chloride 12/12/2019 105  98 - 111 mmol/L Final  . CO2 12/12/2019 25  22 - 32 mmol/L Final  . Glucose, Bld 12/12/2019 108* 70 - 99 mg/dL Final   Glucose reference range applies only to samples taken after fasting for at least 8 hours.  [name] BUN  12/12/2019 33* 8 - 23 mg/dL Final  . Creatinine 12/12/2019 1.09  0.61 - 1.24 mg/dL Final  . Calcium 12/12/2019 9.3  8.9 - 10.3 mg/dL Final  . Total Protein 12/12/2019 6.5  6.5 - 8.1 g/dL Final  . Albumin 12/12/2019 4.1  3.5 - 5.0 g/dL Final  . AST 12/12/2019 17  15 - 41 U/L Final  . ALT 12/12/2019 21  0 - 44 U/L Final  . Alkaline Phosphatase 12/12/2019 61  38 - 126 U/L Final  . Total Bilirubin 12/12/2019 0.5  0.3 - 1.2 mg/dL Final  . GFR, Est Non Af Am 12/12/2019 >60  >60 mL/min Final  . GFR, Est AFR Am 12/12/2019 >60  >60 mL/min Final  . Anion gap 12/12/2019 10  5 - 15 Final   Performed at [facility], [place] [address] [place], [place] [address]  . IgG (Immunoglobin G), Serum 12/12/2019 339* 603 - 1,613 mg/dL Final  . IgA 12/12/2019 40* 61 - 437 mg/dL Final   Result confirmed on concentration.  . IgM (Immunoglobulin M), Srm 12/12/2019 42  15 - 143 mg/dL Final  . Total Protein ELP 12/12/2019 6.1  6.0 - 8.5 g/dL Corrected  . Albumin SerPl Elph-Mcnc 12/12/2019 3.9  2.9 - 4.4 g/dL Corrected  . Alpha 1 12/12/2019 0.3  0.0 - 0.4 g/dL Corrected  . Alpha2 Glob SerPl Elph-Mcnc 12/12/2019 0.6  0.4 - 1.0 g/dL Corrected  . B-Globulin SerPl Elph-Mcnc 12/12/2019 0.8  0.7 - 1.3 g/dL Corrected  . Gamma Glob SerPl Elph-Mcnc 12/12/2019 0.4  0.4 - 1.8 g/dL Corrected  . M Protein SerPl Elph-Mcnc 12/12/2019 Not Observed  Not Observed g/dL Corrected  . Globulin, Total 12/12/2019 2.2  2.2 - 3.9 g/dL Corrected   . Albumin/Glob SerPl 12/12/2019 1.8* 0.7 - 1.7 Corrected  . IFE 1 12/12/2019 Comment   Corrected   Comment: (NOTE) The immunofixation pattern appears unremarkable. Evidence of monoclonal protein is not apparent.   . Please Note 12/12/2019 Comment   Corrected   Comment: (NOTE) Protein electrophoresis scan will follow via computer, mail, or courier delivery. Performed At: [facility] [place], [place] [id number] Rush Farmer MD [id number]   . Kappa free light chain 12/12/2019 7.9  3.3 - 19.4 mg/L Final  . Lamda free light chains 12/12/2019 8.0  5.7 - 26.3 mg/L Final  . Kappa, lamda light chain ratio 12/12/2019 0.99  0.26 - 1.65 Final   Comment: (NOTE) Performed At: [facility] [place], [place] [id number] Rush Farmer MD [id number]   . Copper 12/12/2019 143* 69 - 132 ug/dL Final   Comment: (NOTE) This test was developed and its performance characteristics determined by [facility]. It has not been cleared or approved by the Food and Drug Administration.                                    Detection Limit = 5              **Please note reference interval change** Performed At: [facility] [place], [place] [id number] Rush Farmer MD [id number]   Hospital Outpatient Visit on 11/22/2019  Component Date Value Ref Range Status  . Sodium 11/22/2019 139  135 - 145 mmol/L Final  . Potassium 11/22/2019 4.3  3.5 - 5.1 mmol/L Final  . Chloride 11/22/2019 104  98 - 111 mmol/L Final  . CO2 11/22/2019 26  22 - 32 mmol/L Final  . Glucose, Bld 11/22/2019 93  70 - 99 mg/dL Final   Glucose reference range applies only to samples taken after fasting for at least 8 hours.  . BUN 11/22/2019 32* 8 - 23 mg/dL Final  . Creatinine, Ser 11/22/2019 0.99  0.61 - 1.24 mg/dL Final  . Calcium 11/22/2019 8.9  8.9 - 10.3 mg/dL Final  . GFR calc non Af Amer 11/22/2019 >60  >60 mL/min Final  . GFR calc Af Amer 11/22/2019 >60   >60 mL/min Final  . Anion gap 11/22/2019 9  5 - 15 Final   Performed at [facility], [place] [address] [place], [place] [address]  . WBC 11/22/2019 11.2* 4.0 - 10.5 K/uL Final  . RBC 11/22/2019 3.71* 4.22 - 5.81 MIL/uL Final  . Hemoglobin 11/22/2019 11.4* 13.0 - 17.0 g/dL Final  . HCT 11/22/2019 35.7* 39.0 - 52.0 % Final  . MCV 11/22/2019 96.2  80.0 - 100.0 fL Final  . MCH 11/22/2019 30.7  26.0 - 34.0 pg Final  . MCHC 11/22/2019 31.9  30.0 - 36.0 g/dL Final  . RDW 11/22/2019 14.9  11.5 - 15.5 % Final  . Platelets 11/22/2019 153  150 - 400 K/uL Final  . nRBC 11/22/2019 0.0  0.0 - 0.2 % Final  . Neutrophils Relative % 11/22/2019 73  % Final  . Neutro Abs 11/22/2019 8.3* 1.7 - 7.7 K/uL Final  . Lymphocytes Relative 11/22/2019 16  % Final  . Lymphs Abs 11/22/2019 1.7  0.7 - 4.0 K/uL Final  . Monocytes Relative 11/22/2019 9  % Final  . Monocytes Absolute 11/22/2019 1.0  0.1 - 1.0 K/uL Final  . Eosinophils Relative 11/22/2019 1  % Final  . Eosinophils Absolute 11/22/2019 0.1  0.0 - 0.5 K/uL Final  . Basophils Relative 11/22/2019 0  % Final  . Basophils Absolute 11/22/2019 0.0  0.0 - 0.1 K/uL Final  . Immature Granulocytes 11/22/2019 1  % Final  . Abs Immature Granulocytes 11/22/2019 0.10* 0.00 - 0.07 K/uL Final   Performed at [facility], [place] [address] [place], [place] [address]  . SURGICAL PATHOLOGY 11/22/2019    Final-Edited                   Value:Surgical Pathology CASE: [id number] PATIENT: [facility] Bone Marrow Report     Clinical History: Multiple myeloma     DIAGNOSIS:  BONE MARROW, ASPIRATE, CLOT, CORE: -Variably cellular bone marrow with trilineage hematopoiesis and less than 1% plasma cells -See comment  PERIPHERAL BLOOD: -Normocytic-normochromic anemia -Neutrophilia with mild left shift  COMMENT:  The bone marrow is variably cellular (slightly hypercellular for age) with trilineage hematopoiesis and nonspecific  changes.  The plasma cells represent less than 1% with lack of aggregates or sheets and display polyclonal staining pattern for kappa and lambda light chains.  There is no definitive or diagnostic evidence of residual plasma cell neoplasm. Correlation with cytogenetic and FISH studies is recommended.  MICROSCOPIC DESCRIPTION:  PERIPHERAL BLOOD SMEAR: The red blood cells display mild anisopoikilocytosis with mild polychromasia.  The white blood cells are slightly increase                         d in number, mostly with neutrophils many of which display mild toxic granulation.  An occasional myelocyte is seen on scan.  The platelets are normal in number.  BONE MARROW ASPIRATE: Bone marrow particles present Erythroid precursors:  Orderly and progressive maturation Granulocytic precursors: Orderly and progressive maturation for the most part Megakaryocytes: Abundant with scattered small hypolobated forms Lymphocytes/plasma cells: Large aggregates not present  TOUCH PREPARATIONS: A mixture of cell types present  CLOT AND BIOPSY: The sections show cellularity ranging from 20 to 50% with a mixture of myeloid cell types.  Large clusters or sheets of plasma cells are not identified.  Immunohistochemical stain for CD138 and in situ hybridization for kappa and lambda were performed on blocks B1 and C1 with appropriate controls.  CD138 highlights an extremely minor plasma cell component consisting of scattering of interstitial cells with lack of clusters.  The plasma c                         ells appear to show a polyclonal staining pattern for kappa and lambda light chains.  IRON STAIN: Iron stains are performed on a bone marrow aspirate or touch imprint smear and section of clot. The controls stained appropriately.       Storage Iron: Abundant      Ring Sideroblasts: Absent  ADDITIONAL DATA/TESTING: The specimen was sent for cytogenetic analysis and FISH for multiple myeloma and a  separate report will follow  CELL COUNT DATA:  Bone Marrow count performed on 500 cells shows: Blasts:   0%   Myeloid:  61% Promyelocytes: 0%   Erythroid:     23% Myelocytes:    8%   Lymphocytes:   13% Metamyelocytes:     0%   Plasma cells:  <1% Bands:    18% Neutrophils:   32%  M:E ratio:     2.65 Eosinophils:   3% Basophils:     0% Monocytes:     3%  Lab Data: CBC performed on 11/22/2019 shows: WBC: 11.2 k/uL Neutrophils:   78% Hgb: 11.4 g/dL Lymphocytes:   14% HCT: 35.7 %    Monocytes:     6% MCV: 96.2 fL   Eosinophils:   1% RDW: 14.9 %    Basophils:     1% PLT:                          153 k/uL    GROSS DESCRIPTION:  A: Aspirate smear  B: Received in B-plus fixative are tissue fragments which measure 1.1 x 1.0 x 0.2 cm in aggregate.  The specimen is submitted in toto.  C: Received in B-plus fixative are 2 cores of bone which measure 1.0 and 1.3 cm in length and each 0.2 cm in diameter.  The specimen is submitted in toto fine decalcification.  [facility] 11/22/2019)   Final Diagnosis performed by Susanne Greenhouse, MD.   Electronically signed 11/26/2019 Technical and / or Professional components performed at [facility], [place] [address], [place], [place] [address].  Immunohistochemistry Technical component (if applicable) was performed at [facility]. [address], [place], [place], [place] [address].   IMMUNOHISTOCHEMISTRY DISCLAIMER (if applicable): Some of these immunohistochemical stains may have been developed and the performance characteristics determine by [facility]. Some may not have been clear                         ed or approved by the U.S. Food and Drug Administration. The FDA has determined that such clearance or approval is not necessary. This test is used for clinical purposes. It should not be regarded as  investigational or for research. This laboratory is certified under the [place] (CLIA-88) as qualified to perform high complexity clinical laboratory testing.  The controls stained appropriately.      X-Rays:DG Chest 1 View  Result Date: 01/07/2020 CLINICAL DATA:  Fall. EXAM: CHEST  1 VIEW COMPARISON:  September 30, 2019. FINDINGS: The heart size and mediastinal contours are within normal limits. Both lungs are clear. No pneumothorax or pleural effusion is noted. Status post transcatheter aortic valve repair. The visualized skeletal structures are unremarkable. IMPRESSION: No active disease. Electronically Signed   By: Marijo Conception M.D.   On: 01/07/2020 17:15   DG Lumbar Spine 2-3 Views  Result Date: 01/07/2020 CLINICAL DATA:  Low back pain after fall. EXAM: LUMBAR SPINE - 2-3 VIEW COMPARISON:  July 02, 2016. FINDINGS: No definite fracture or spondylolisthesis is noted. Mild degenerative disc disease is noted at L1-2, L2-3 and L4-5. Diffuse osteopenia is noted. IMPRESSION: Mild multilevel degenerative disc disease. No acute abnormality seen in the lumbar spine. Electronically Signed   By: Marijo Conception M.D.   On: 01/07/2020 17:18   DG Pelvis 1-2 Views  Result Date: 01/07/2020 CLINICAL DATA:  Fall. EXAM: PELVIS - 1-2 VIEW COMPARISON:  September 14, 2019. FINDINGS: No definite fracture or dislocation is noted. There is again noted lucent lesions bilaterally, right greater than left, within the iliac bones and acetabuli, consistent with history of multiple myeloma as seen on prior CT scan. IMPRESSION: Stable lucent lesions are noted bilaterally, right greater than left, consistent with history of multiple myeloma. No acute fracture or dislocation is noted. Electronically Signed   By: Marijo Conception M.D.   On: 01/07/2020 17:22   DG Elbow 2 Views Left  Result Date: 01/07/2020 CLINICAL DATA:  Left arm pain after fall. EXAM: LEFT ELBOW - 2 VIEW COMPARISON:  None. FINDINGS: There is no evidence of fracture, dislocation, or joint effusion. There is  no evidence of arthropathy or other focal bone abnormality. Soft tissues are unremarkable. IMPRESSION: Negative. Electronically Signed   By: Marijo Conception M.D.   On: 01/07/2020 17:17   DG Shoulder Left  Result Date: 01/07/2020 CLINICAL DATA:  Left shoulder pain after fall. EXAM: LEFT SHOULDER - 2+ VIEW COMPARISON:  None. FINDINGS: Moderately displaced and comminuted fracture is seen involving the proximal left humeral head and neck. Glenohumeral joint is intact. Ribs are unremarkable. IMPRESSION: Moderately displaced and comminuted proximal left humeral head and neck fracture. Electronically Signed   By: Marijo Conception M.D.   On: 01/07/2020 17:19   DG Humerus Left  Result Date: 01/15/2020 CLINICAL DATA:  ORIF EXAM: DG C-ARM 1-60 MIN; LEFT HUMERUS - 2+ VIEW CONTRAST:  None FLUOROSCOPY TIME:  Fluoroscopy Time:  33 seconds Number of Acquired Spot Images: 3 COMPARISON:  01/07/2020 FINDINGS: Three low resolution intraoperative spot views of the left humerus. The images demonstrate surgical plate and multiple screw fixation of proximal humerus fracture. IMPRESSION: Intraoperative fluoroscopic assistance provided during surgical fixation of left humerus fracture. Electronically Signed   By: Donavan Foil M.D.   On: 01/15/2020 16:55   DG Humerus Left  Result Date: 01/07/2020 CLINICAL DATA:  Left arm pain after fall. EXAM: LEFT HUMERUS - 2+ VIEW COMPARISON:  None. FINDINGS: Mildly displaced and possibly comminuted fracture is seen involving the proximal left humeral head and neck. No soft tissue abnormality is noted. IMPRESSION: Mildly displaced and possibly comminuted proximal left humeral head and neck fracture. Electronically Signed  By: Marijo Conception M.D.   On: 01/07/2020 17:13   DG C-Arm 1-60 Min  Result Date: 01/15/2020 CLINICAL DATA:  ORIF EXAM: DG C-ARM 1-60 MIN; LEFT HUMERUS - 2+ VIEW CONTRAST:  None FLUOROSCOPY TIME:  Fluoroscopy Time:  33 seconds Number of Acquired Spot Images: 3 COMPARISON:   01/07/2020 FINDINGS: Three low resolution intraoperative spot views of the left humerus. The images demonstrate surgical plate and multiple screw fixation of proximal humerus fracture. IMPRESSION: Intraoperative fluoroscopic assistance provided during surgical fixation of left humerus fracture. Electronically Signed   By: Donavan Foil M.D.   On: 01/15/2020 16:55    EKG: Orders placed or performed during the hospital encounter of 10/23/19  . EKG 12-Lead  . EKG 12-Lead     Hospital Course: Ferris Fielden is a 79 y.o. who was admitted to Hospital. They were brought to the operating room on 01/15/2020 and underwent Procedure(s): OPEN REDUCTION INTERNAL FIXATION (ORIF) PROXIMAL HUMERUS FRACTURE.  Patient tolerated the procedure well and was later transferred to the recovery room and then to the orthopaedic floor for postoperative care.  They were given PO and IV analgesics for pain control following their surgery.  They were given 24 hours of postoperative antibiotics of  Anti-infectives (From admission, onward)   Start     Dose/Rate Route Frequency Ordered Stop   01/15/20 2200  acyclovir (ZOVIRAX) tablet 400 mg  Status:  Discontinued     400 mg Oral 2 times daily 01/15/20 1749 01/16/20 2004   01/15/20 2100  ceFAZolin (ANCEF) IVPB 1 g/50 mL premix     1 g 100 mL/hr over 30 Minutes Intravenous Every 6 hours 01/15/20 1805 01/16/20 1025   01/15/20 1145  ceFAZolin (ANCEF) IVPB 2g/100 mL premix     2 g 200 mL/hr over 30 Minutes Intravenous On call to O.R. 01/15/20 1132 01/15/20 1545     and started on DVT prophylaxis in the form of Aspirin and Eliquis..   After one overnight stay, the patient had progressed with therapy and meeting their goals.  Incision was healing well.  Patient was seen in rounds and was ready to go home.   Diet: Regular diet Activity:NWB Follow-up:in 2 weeks Disposition - Home Discharged Condition: good   Discharge Instructions    Call MD / Call 911   Complete by: As  directed    If you experience chest pain or shortness of breath, CALL 911 and be transported to the hospital emergency room.  If you develope a fever above 101 F, pus (white drainage) or increased drainage or redness at the wound, or calf pain, call your surgeon's office.   Call MD / Call 911   Complete by: As directed    If you experience chest pain or shortness of breath, CALL 911 and be transported to the hospital emergency room.  If you develope a fever above 101 F, pus (white drainage) or increased drainage or redness at the wound, or calf pain, call your surgeon's office.   Constipation Prevention   Complete by: As directed    Drink plenty of fluids.  Prune juice may be helpful.  You may use a stool softener, such as Colace (over the counter) 100 mg twice a day.  Use MiraLax (over the counter) for constipation as needed.   Constipation Prevention   Complete by: As directed    Drink plenty of fluids.  Prune juice may be helpful.  You may use a stool softener, such as Colace (over the counter)  100 mg twice a day.  Use MiraLax (over the counter) for constipation as needed.   Diet - low sodium heart healthy   Complete by: As directed    Increase activity slowly as tolerated   Complete by: As directed    Increase activity slowly as tolerated   Complete by: As directed      Allergies as of 01/16/2020      Reactions   Xarelto [rivaroxaban] Other (See Comments), Hypertension   INCREASED BP-HYPERTENSIVE EVENTS   Ambien [zolpidem]    Hallucinations    Corticosteroids Other (See Comments)   Made the patient  "sick," feel "weird," and his "body rejected" them   Ramipril Other (See Comments)   Could not eat or sleep, lost muscle mass   Benazepril Cough      Medication List    TAKE these medications   acyclovir 400 MG tablet Commonly known as: ZOVIRAX Take 1 tablet (400 mg total) by mouth 2 (two) times daily.   amiodarone 200 MG tablet Commonly known as: PACERONE TAKE 1 TABLET BY  MOUTH EVERY DAY   amLODipine 5 MG tablet Commonly known as: NORVASC Take 1 tablet (5 mg total) by mouth daily.   amoxicillin-clavulanate 875-125 MG tablet Commonly known as: AUGMENTIN Take 1 tablet by mouth 2 (two) times daily.   b complex vitamins tablet Take 1 tablet by mouth daily.   Calcium-Magnesium 500-250 MG Tabs Take 1 tablet by mouth daily.   dexamethasone 4 MG tablet Commonly known as: DECADRON Take 3 tablets (12 mg total) by mouth once a week.   diclofenac Sodium 1 % Gel Commonly known as: VOLTAREN Apply 1 application topically 4 (four) times daily as needed (pain).   DULoxetine 60 MG capsule Commonly known as: CYMBALTA TAKE 1 CAPSULE BY MOUTH EVERY DAY What changed: how much to take   Eliquis 5 MG Tabs tablet Generic drug: apixaban Take 5 mg by mouth 2 (two) times daily.   ergocalciferol 1.25 MG (50000 UT) capsule Commonly known as: VITAMIN D2 Take 50,000 Units by mouth once a week. Every Monday   famotidine 40 MG tablet Commonly known as: Pepcid Take 1 tablet (40 mg total) by mouth daily.   furosemide 20 MG tablet Commonly known as: LASIX TAKE 3 TABLETS BY MOUTH EVERY DAY What changed:   how much to take  when to take this   gabapentin 300 MG capsule Commonly known as: NEURONTIN Take 1 capsule (300 mg total) by mouth 3 (three) times daily.   loratadine 10 MG tablet Commonly known as: CLARITIN TAKE 1 TABLET BY MOUTH EVERY DAY   LORazepam 0.5 MG tablet Commonly known as: Ativan Take 1 tablet (0.5 mg total) by mouth every 8 (eight) hours as needed (Nausea or vomiting). What changed: when to take this   LUBRICATING EYE DROPS OP Place 1 drop into both eyes daily as needed (dry eyes).   metoprolol succinate 25 MG 24 hr tablet Commonly known as: TOPROL-XL TAKE 1 TABLET BY MOUTH TWICE A DAY What changed: when to take this   ondansetron 8 MG tablet Commonly known as: Zofran Take 1 tablet (8 mg total) by mouth 2 (two) times daily as needed  (Nausea or vomiting).   oxyCODONE 5 MG immediate release tablet Commonly known as: Oxy IR/ROXICODONE Take 2 tablets (10 mg total) by mouth every 4 (four) hours as needed for moderate pain or severe pain. What changed: how much to take   polyethylene glycol powder 17 GM/SCOOP powder Commonly known as:  GLYCOLAX/MIRALAX Take 17-34 g by mouth 2 (two) times daily as needed for moderate constipation.   Pro-Stat 101 Liqd Take 30 mLs by mouth daily.   prochlorperazine 10 MG tablet Commonly known as: COMPAZINE Take 1 tablet (10 mg total) by mouth every 6 (six) hours as needed (Nausea or vomiting).   senna-docusate 8.6-50 MG tablet Commonly known as: Senokot-S Take 2 tablets by mouth at bedtime.   spironolactone 25 MG tablet Commonly known as: ALDACTONE Take 1 tablet (25 mg total) by mouth daily.   Vitamin D 50 MCG (2000 UT) tablet Take 2,000 Units by mouth daily.      Follow-up Information    Nicholes Stairs, MD Follow up.   Specialty: Orthopedic Surgery Contact information: [address] [place] [address] [phone]           Signed: Geralynn Rile, MD Orthopaedic Surgery 01/18/2020, 3:20 PM

## 2020-01-22 ENCOUNTER — Other Ambulatory Visit: Payer: Self-pay

## 2020-01-22 ENCOUNTER — Ambulatory Visit (HOSPITAL_COMMUNITY)
Admission: RE | Admit: 2020-01-22 | Discharge: 2020-01-22 | Disposition: A | Discharge status: Home / Self Care | Payer: Medicare Other | Source: Ambulatory Visit | Attending: Cardiology | Admitting: Cardiology

## 2020-01-22 VITALS — BP 132/78 | HR 67 | Wt 167.0 lb

## 2020-01-22 DIAGNOSIS — R7989 Other specified abnormal findings of blood chemistry: Secondary | ICD-10-CM | POA: Diagnosis not present

## 2020-01-22 DIAGNOSIS — Z7901 Long term (current) use of anticoagulants: Secondary | ICD-10-CM | POA: Diagnosis not present

## 2020-01-22 DIAGNOSIS — I480 Paroxysmal atrial fibrillation: Secondary | ICD-10-CM | POA: Diagnosis not present

## 2020-01-22 DIAGNOSIS — Z8719 Personal history of other diseases of the digestive system: Secondary | ICD-10-CM | POA: Insufficient documentation

## 2020-01-22 DIAGNOSIS — Z952 Presence of prosthetic heart valve: Secondary | ICD-10-CM | POA: Insufficient documentation

## 2020-01-22 DIAGNOSIS — C9000 Multiple myeloma not having achieved remission: Secondary | ICD-10-CM | POA: Insufficient documentation

## 2020-01-22 DIAGNOSIS — I38 Endocarditis, valve unspecified: Secondary | ICD-10-CM | POA: Diagnosis not present

## 2020-01-22 DIAGNOSIS — Z888 Allergy status to other drugs, medicaments and biological substances status: Secondary | ICD-10-CM | POA: Insufficient documentation

## 2020-01-22 DIAGNOSIS — I110 Hypertensive heart disease with heart failure: Secondary | ICD-10-CM | POA: Insufficient documentation

## 2020-01-22 DIAGNOSIS — Z79899 Other long term (current) drug therapy: Secondary | ICD-10-CM | POA: Diagnosis not present

## 2020-01-22 DIAGNOSIS — I4510 Unspecified right bundle-branch block: Secondary | ICD-10-CM | POA: Diagnosis not present

## 2020-01-22 DIAGNOSIS — I5032 Chronic diastolic (congestive) heart failure: Secondary | ICD-10-CM | POA: Diagnosis not present

## 2020-01-22 DIAGNOSIS — G629 Polyneuropathy, unspecified: Secondary | ICD-10-CM | POA: Insufficient documentation

## 2020-01-22 DIAGNOSIS — I2510 Atherosclerotic heart disease of native coronary artery without angina pectoris: Secondary | ICD-10-CM | POA: Diagnosis not present

## 2020-01-22 DIAGNOSIS — Z8249 Family history of ischemic heart disease and other diseases of the circulatory system: Secondary | ICD-10-CM | POA: Diagnosis not present

## 2020-01-22 DIAGNOSIS — E7800 Pure hypercholesterolemia, unspecified: Secondary | ICD-10-CM | POA: Diagnosis not present

## 2020-01-22 DIAGNOSIS — I350 Nonrheumatic aortic (valve) stenosis: Secondary | ICD-10-CM | POA: Diagnosis not present

## 2020-01-22 LAB — COMPREHENSIVE METABOLIC PANEL
ALT: 26 U/L (ref 0–44)
AST: 23 U/L (ref 15–41)
Albumin: 3.8 g/dL (ref 3.5–5.0)
Alkaline Phosphatase: 84 U/L (ref 38–126)
Anion gap: 8 (ref 5–15)
BUN: 18 mg/dL (ref 8–23)
CO2: 27 mmol/L (ref 22–32)
Calcium: 9.3 mg/dL (ref 8.9–10.3)
Chloride: 105 mmol/L (ref 98–111)
Creatinine, Ser: 0.98 mg/dL (ref 0.61–1.24)
GFR calc Af Amer: >60 mL/min (ref >60)
GFR calc non Af Amer: >60 mL/min (ref >60)
Glucose, Bld: 106 mg/dL — ABNORMAL HIGH (ref 70–99)
Potassium: 4.3 mmol/L (ref 3.5–5.1)
Sodium: 140 mmol/L (ref 135–145)
Total Bilirubin: 0.6 mg/dL (ref 0.3–1.2)
Total Protein: 6.1 g/dL — ABNORMAL LOW (ref 6.5–8.1)

## 2020-01-22 LAB — CBC
HCT: 39.5 % (ref 39.0–52.0)
Hemoglobin: 12.7 g/dL — ABNORMAL LOW (ref 13.0–17.0)
MCH: 31 pg (ref 26.0–34.0)
MCHC: 32.2 g/dL (ref 30.0–36.0)
MCV: 96.3 fL (ref 80.0–100.0)
Platelets: 233 10*3/uL (ref 150–400)
RBC: 4.1 MIL/uL — ABNORMAL LOW (ref 4.22–5.81)
RDW: 13.4 % (ref 11.5–15.5)
WBC: 9.2 10*3/uL (ref 4.0–10.5)
nRBC: 0 % (ref 0.0–0.2)

## 2020-01-22 LAB — TSH: TSH: 7.026 u[IU]/mL — ABNORMAL HIGH (ref 0.350–4.500)

## 2020-01-22 NOTE — Patient Instructions (Signed)
Labs done today. We will contact you only if your labs are abnormal.  No medication changes were made. Please continue all current medications as prescribed.  Your physician recommends that you schedule a follow-up appointment in: 3 months  At the [facility], you and your health needs are our priority. As part of our continuing mission to provide you with exceptional heart care, we have created designated Provider Care Teams. These Care Teams include your primary Cardiologist (physician) and Advanced Practice Providers (APPs- Physician Assistants and Nurse Practitioners) who all work together to provide you with the care you need, when you need it.   You may see any of the following providers on your designated Care Team at your next follow up: . Dr Daniel Bensimhon . Dr Dalton McLean . Amy Clegg, NP . Brittainy Simmons, PA . Lauren Kemp, PharmD   Please be sure to bring in all your medications bottles to every appointment.     

## 2020-01-22 NOTE — Progress Notes (Signed)
Date:  01/22/2020  ID:  [name] [name], DOB Jan 11, 1941, MRN [id number]  Provider location: [facility] Type of Visit: Established patient   PCP:  [name], [name], MD  Cardiologist: Dr. Aundra Dubin   History of Present Illness: [name] [name] is a 79 y.o. male who has history of HTN, aortic stenosis and paroxysmal atrial fibrillation. He was hospitalized with atrial fibrillation/RVR in 5/14.  He had TEE-guided cardioversion.  TEE showed bicuspid aortic valve with mild AS and a moderately dilated ascending aorta.  Most echo in 4/17 showed moderate aortic stenosis and MRA chest in 11/17 showed 4.1 cm ascending aorta.   He was on Xarelto for anticoagulation but stopped it as he was convinced it was causing joint pains.  He then refused to start any other anticoagulation at that time.    In 5/17, he had been back in atrial fibrillation for several days and was symptomatic.  I started him on Eliquis and planned TEE-guided DCCV given significant symptoms, but he converted back to NSR on his own.  He continued Eliquis for about 1 month then stopped it on his own.    In 11/17, he was hospitalized with symptomatic atrial fibrillation with RVR.  I started him on diltiazem CD and Eliquis with plan for TEE-guided DCCV.  However, he converted back to NSR on his own. He stopped the diltiazem but has continued the Eliquis.   He was admitted in 2/18 with fever, LUL PNA and left-sided pleural effusion.  Thoracentesis on left was suggestive of parapneumonic effusion.  He was in the hospital 11 days.  During that time, he went into atrial fibrillation with RVR.  He was started on amiodarone and went back into NSR.  Amiodarone was subsequently stopped.   Recurrent atrial fibrillation with RVR in 2/19, felt more fatigued.  He underwent TEE-guided DCCV back to NSR.   He had a lower GI bleed in 11/19 post-polypectomy.  He has since restarted on Eliquis.   Coronary CTA was done in  2/20, this showed mild nonobstructive CAD.  Echo in 2/20 showed EF 60-65%, bicuspid aortic valve with severe AS.   LHC in 6/20 showed no significant coronary disease.  In 8/20, he had TAVR with [place] 3 THV x 2 (valve in valve due to peri-valvular leak initially).  Post-procedure echoes have shown elevated gradient across the aortic valve though dimensionless index has only been in the mildly stenotic range.  Last echo in 9/20 showed mean aortic valve gradient 26 mmHg with dimensionless index 0.54.   Patient has additionally been diagnosed with multiple myeloma.  He was treated with Revlimid, Velcade, and prednisone.   He was admitted early in 1/21 with atrial fibrillation/RVR associated with ischemic colitis.  He underwent TEE-guided DCCV to NSR.   He was readmitted later in 1/21 with PNA.    He fell in 4/21, tripped and fractured his left proximal humerus.  He had ORIF in 5/21.   Patient returns for followup of aortic stenosis and atrial fibrillation.  He is in NSR today.  Still has neuropathic pain in his feet, this is very bothersome and limiting.  He has been seeing a neurologist but has really not had much relief yet.  He is walking with a cane due to poor balance.  He is wearing compression stockings.  No significant dyspnea but not very active.  No palpitations.  No lightheadedness.  No chest pain.   ECG (personally reviewed): NSR, LAFB, nonspecific  T wave flattening  Labs (5/14): K 3.7, creatinine 0.9, BNP 2261=>109, TSH normal Labs (7/14): K 3.5, creatinine 1.0 Labs (12/14): K 3.8, creatinine 1.0, LDL particle number 1374, LDL 107, TSH normal Labs (6/16): TSH normal, K 3.8, creatinine 0.84, HCT 42.4, LDL 85, LFTs normal Labs (3/17): K 3.8, creatinine 0.87 Labs (5/17): K 4.5, creatinine 0.97, HCT 43.7 Labs (11/17): K 3.2, creatinine 0.81, LDL 71, HDL 24 Labs (2/18): K 3.8, creatinine 0.89 Labs (2/19): K 3.7, creatinine 0.83, hgb 16 Labs (11/19): LDL 82 Labs (1/20): TSH  elevated but free T4 normal, K 4, creatinine 0.85, hgb 13.5 Labs (2/20): TSH mildly elevated but free T4 normal, K 4, creatinine 0.85 Labs (5/20): ESR 48 Labs (9/20): hgb 14.1, plts 38 => 82, K 3.3, creatinine 0.98 => 1.06 Labs (11/20): BNP 126.5 Labs (12/20): hgb 11.9, K 3.2, creatinine 0.87 Labs (1/21): K 4.8, creatinine 1.42, AST 30, ALT 60 Labs (4/21): K 4.5, creatinine 1.22, hgb 12.3, plts 122  Allergies (verified):  No Known Drug Allergies   Past Medical History:  1. Hypertension: ACEI cough.  2. Atrial fibrillation. The patient had new-onset atrial fibrillation in November 2009. He underwent ibutilide cardioversion successfully. He has had 1-2 episodes/year that are short-lived that likely are atrial fibrillation with RVR. He was admitted in 5/14 with atrial fibrillation/RVR and had TEE-guided DCCV.  Atrial fibrillation again in 5/17, converted back to NSR spontaneously.  CHADSVASC score 2.  - Atrial fibrillation 2/19 with TEE-guided DCCV.  - 1/21 TEE-guided DCCV 3. Hypercholesterolemia.  4. Bicuspid aortic valve disorder: Echo (7/11): EF 55-60%, mild LV hypertrophy, mild aortic stenosis with mean gradient 19 mmHg and peak gradient 36 mmHg.  TEE (5/14): EF 55%, mild LVH, bicuspid aortic valve with mild AS (mean gradient 13 mmHg), ascending aorta 4.5 cm.  Echo (9/15) with EF 65-70%, moderate AS (mean gradient 23 mmHg), mild AI, ascending aorta 4.1 cm, mild MR.   - Echo (4/17) with EF 65-70%, moderate aortic stenosis with mean gradient 27 mmHg, PASP 31 mmHg, ascending aorta 4.4 cm.  - Echo (2/18) with EF 55-60%, bicuspid aortic valve with moderate aortic stenosis (underestimated gradient).  - TEE (2/19): EF 60-65%, moderate LVH, bicuspid aortic valve with moderate AS with mean gradient 28 mmHg and AVA 1.2 cm^2, 4.4 cm ascending aorta.  - Echo (2/20): EF 60-65%, mild LVH, bicuspid aortic valve with severe AS (mean gradient 48 mmHg, AVA 0.8 cm^2).  - TAVR 8/20 with valve in valve [place] 3 THVs (because of peri-valvular leak after initial valve placed).  - Echo (9/20): EF > 65%, mild LVH, mild RV dilation with normal RV systolic function, mean aortic valve gradient 26 mmHg with dimensionless index 0.54, IVC normal.  - TEE (1/21): EF 60-65%, moderate LVH, normal RV, s/p valve-in-valve TAVR with mean gradient 8 mmHg and no PVL.  5. Osteoarthritis.  6. Low back pain.  7. Chest pain: ETT-myoview (12/11) with 10:51 exercise, no chest pain, no significant ST changes, EF 69%, no evidence for ischemia or infarction.  - Coronary CTA (2/20): calcium score 41, nonobstructive CAD.  - LHC (6/20): No significant CAD.  8. Ascending aortic aneurysm: Associated with bicuspid aortic valve.  4.5 cm by TEE in 5/14.   MRA chest (6/14) with bicuspid aortic valve, 4.3 cm ascending aortic aneurysm.  MRA chest (10/15) with 4.2 cm ascending aorta (bicuspid aortic valve noted).  Echo (4/17) with ascending aorta diameter 4.4 cm.  - MRA chest (11/17) with 4.1 cm ascending aorta.  -  Echo (2/19): 4.4 cm ascending aorta.  - Coronary CTA in 2/20 showed 4.3 cm ascending aorta.  9. Colonic polyps: GI bleeding in 11/19 s/p polypectomy.  10. Multiple myeloma.  11. Bradycardia 12. Thrombocytopenia: likely related to chemotherapy for multiple myeloma.  13. PVCs: Zio monitor in 10/20 showed 9.4% PVCs.  14. Chronic diastolic CHF: RHC (16/10) with mean RA 3, PA 22/2, mean PCWP 6, CI 2.74   Current Outpatient Medications  Medication Sig Dispense Refill  . acyclovir (ZOVIRAX) 400 MG tablet Take 1 tablet (400 mg total) by mouth 2 (two) times daily. 180 tablet 3  . amiodarone (PACERONE) 200 MG tablet TAKE 1 TABLET BY MOUTH EVERY DAY 30 tablet 5  . amLODipine (NORVASC) 5 MG tablet Take 1 tablet (5 mg total) by mouth daily. 90 tablet 3  . apixaban (ELIQUIS) 5 MG TABS tablet Take 5 mg by mouth 2 (two) times daily.    [name] b complex vitamins tablet Take 1 tablet by mouth daily. 100 tablet 3  . Calcium-Magnesium  500-250 MG TABS Take 1 tablet by mouth daily.    . Carboxymethylcellul-Glycerin (LUBRICATING EYE DROPS OP) Place 1 drop into both eyes daily as needed (dry eyes).    . Cholecalciferol (VITAMIN D) 50 MCG (2000 UT) tablet Take 2,000 Units by mouth daily.    [name] dexamethasone (DECADRON) 4 MG tablet Take 3 tablets (12 mg total) by mouth once a week. 22 tablet 1  . DULoxetine (CYMBALTA) 60 MG capsule TAKE 1 CAPSULE BY MOUTH EVERY DAY (Patient taking differently: Take 60 mg by mouth daily. ) 90 capsule 1  . ergocalciferol (VITAMIN D2) 1.25 MG (50000 UT) capsule Take 50,000 Units by mouth once a week. Every Monday    . famotidine (PEPCID) 40 MG tablet Take 1 tablet (40 mg total) by mouth daily. 30 tablet 11  . furosemide (LASIX) 20 MG tablet TAKE 3 TABLETS BY MOUTH EVERY DAY (Patient taking differently: Take 20 mg by mouth every other day. ) 270 tablet 3  . gabapentin (NEURONTIN) 300 MG capsule Take 1 capsule (300 mg total) by mouth 3 (three) times daily.    [name] loratadine (CLARITIN) 10 MG tablet TAKE 1 TABLET BY MOUTH EVERY DAY (Patient taking differently: Take 10 mg by mouth daily. ) 90 tablet 3  . LORazepam (ATIVAN) 0.5 MG tablet Take 1 tablet (0.5 mg total) by mouth every 8 (eight) hours as needed (Nausea or vomiting). (Patient taking differently: Take 0.5 mg by mouth at bedtime. ) 60 tablet 2  . metoprolol succinate (TOPROL-XL) 25 MG 24 hr tablet TAKE 1 TABLET BY MOUTH TWICE A DAY (Patient taking differently: Take 25 mg by mouth in the morning and at bedtime. ) 180 tablet 3  . oxyCODONE (OXY IR/ROXICODONE) 5 MG immediate release tablet Take 2 tablets (10 mg total) by mouth every 4 (four) hours as needed for moderate pain or severe pain. 40 tablet 0  . polyethylene glycol powder (GLYCOLAX/MIRALAX) 17 GM/SCOOP powder Take 17-34 g by mouth 2 (two) times daily as needed for moderate constipation. 500 g 5  . senna-docusate (SENOKOT-S) 8.6-50 MG tablet Take 2 tablets by mouth at bedtime.    [name] spironolactone  (ALDACTONE) 25 MG tablet Take 1 tablet (25 mg total) by mouth daily. 30 tablet 3   No current facility-administered medications for this encounter.    Allergies:   Xarelto [rivaroxaban], Ambien [zolpidem], Corticosteroids, Ramipril, and Benazepril   Social History:  The patient  reports that he has never smoked. He has  never used smokeless tobacco. He reports previous alcohol use. He reports that he does not use drugs.   Family History:  The patient's family history includes Colon cancer (age of onset: 31) in his mother; Hypertension in an other family member.   ROS:  Please see the history of present illness.   All other systems are personally reviewed and negative.   Exam:   BP 132/78   Pulse 67   Wt 75.8 kg (167 lb)   SpO2 98%   BMI 23.29 kg/m  General: NAD Neck: No JVD, no thyromegaly or thyroid nodule.  Lungs: Clear to auscultation bilaterally with normal respiratory effort. CV: Nondisplaced PMI.  Heart regular S1/S2, no S3/S4, 2/6 SEM RUSB.  1+ ankle edema.  No carotid bruit.  Normal pedal pulses.  Abdomen: Soft, nontender, no hepatosplenomegaly, no distention.  Skin: Intact without lesions or rashes.  Neurologic: Alert and oriented x 3.  Psych: Normal affect. Extremities: No clubbing or cyanosis.  HEENT: Normal.   Recent Labs: 07/30/2019: B Natriuretic Peptide 126.5 10/02/2019: Magnesium 2.5 01/22/2020: ALT 26; BUN 18; Creatinine, Ser 0.98; Hemoglobin 12.7; Platelets 233; Potassium 4.3; Sodium 140; TSH 7.026  Personally reviewed   Wt Readings from Last 3 Encounters:  01/22/20 75.8 kg (167 lb)  01/15/20 73.5 kg (162 lb)  12/12/19 72.7 kg (160 lb 3.2 oz)    ASSESSMENT AND PLAN:  1. Atrial fibrillation: Paroxysmal. He is quite symptomatic when in atrial fibrillation.  CHADSVASC = 3.  He is in NSR today.   - Continue Eliquis.  - He has not wanted atrial fibrillation ablation.   - Continue amiodarone.  Check CMET and TSH today (ALT has been mildly elevated). He will  need a regular eye exam while on amiodarone.   2. Peripheral neuropathy: Patient developed a very painful peripheral neuropathy on Velcade.  This has improved off Velcade but is still very symptomatic.  3. Bradycardia: After TAVR, patient had RBBB and LAFB with HR down to the 40s.  RBBB has resolved and HR is higher.  4. PVCs: Zio monitor in 10/20 showed 9.4% PVCs.  Toprol XL was increased to 25 mg bid, he is not feeling palpitations.   5. HTN: BP controlled.     - Continue amlodipine 5 mg daily.     - Continue spironolactone 50 mg daily.  6. Bicuspid aortic valve disorder: Ascending aorta was 4.3 cm on recent coronary CTA. He is now s/p TAVR with 2 [place] (valve-in-valve due to peri-valvular leak after 1st valve placed). Post-op, mean gradient across the aortic valve was elevated, 26 mmHg in 9/20.  Dimensionless index, in 9/20 0.54, only suggests mild bioprosthetic aortic stenosis.  Echo in 1/21 showed mean gradient down to 10 mmHg and TEE showed mean gradient 8 mmHg with no peri-valvular leakage.  7. Chronic diastolic CHF: He is now on Lasix 20 qod.  He does not look volume overloaded.   - Continue current Lasix.  8. Multiple myeloma: He is off Revlimid, prednisone, and Velcade.  He appears to have had a good response.    9. Deconditioning: He will be starting PT soon.   Followup in 3 months.   Signed, [name], MD  01/22/2020  [place] [address] [facility] [place] [address] [phone] (office) [phone] (fax)

## 2020-01-23 ENCOUNTER — Encounter: Payer: Medicare Other | Admitting: Physical Therapy

## 2020-01-28 ENCOUNTER — Encounter: Payer: Medicare Other | Admitting: Physical Therapy

## 2020-01-29 DIAGNOSIS — M25612 Stiffness of left shoulder, not elsewhere classified: Secondary | ICD-10-CM | POA: Diagnosis not present

## 2020-01-29 DIAGNOSIS — M25512 Pain in left shoulder: Secondary | ICD-10-CM | POA: Diagnosis not present

## 2020-01-30 ENCOUNTER — Encounter: Payer: Medicare Other | Admitting: Physical Therapy

## 2020-01-31 DIAGNOSIS — M25612 Stiffness of left shoulder, not elsewhere classified: Secondary | ICD-10-CM | POA: Diagnosis not present

## 2020-01-31 DIAGNOSIS — M25512 Pain in left shoulder: Secondary | ICD-10-CM | POA: Diagnosis not present

## 2020-02-04 ENCOUNTER — Encounter: Payer: Medicare Other | Admitting: Physical Therapy

## 2020-02-05 ENCOUNTER — Inpatient Hospital Stay: Payer: Medicare Other | Attending: Hematology

## 2020-02-05 ENCOUNTER — Other Ambulatory Visit: Payer: Self-pay

## 2020-02-05 DIAGNOSIS — C9000 Multiple myeloma not having achieved remission: Secondary | ICD-10-CM | POA: Diagnosis not present

## 2020-02-05 DIAGNOSIS — C7951 Secondary malignant neoplasm of bone: Secondary | ICD-10-CM

## 2020-02-05 DIAGNOSIS — M25612 Stiffness of left shoulder, not elsewhere classified: Secondary | ICD-10-CM | POA: Diagnosis not present

## 2020-02-05 DIAGNOSIS — M25512 Pain in left shoulder: Secondary | ICD-10-CM | POA: Diagnosis not present

## 2020-02-05 LAB — CBC WITH DIFFERENTIAL/PLATELET
Abs Immature Granulocytes: 0.18 10*3/uL — ABNORMAL HIGH (ref 0.00–0.07)
Basophils Absolute: 0 10*3/uL (ref 0.0–0.1)
Basophils Relative: 1 %
Eosinophils Absolute: 0.5 10*3/uL (ref 0.0–0.5)
Eosinophils Relative: 6 %
HCT: 38.5 % — ABNORMAL LOW (ref 39.0–52.0)
Hemoglobin: 12.4 g/dL — ABNORMAL LOW (ref 13.0–17.0)
Immature Granulocytes: 2 %
Lymphocytes Relative: 11 %
Lymphs Abs: 1 10*3/uL (ref 0.7–4.0)
MCH: 30.4 pg (ref 26.0–34.0)
MCHC: 32.2 g/dL (ref 30.0–36.0)
MCV: 94.4 fL (ref 80.0–100.0)
Monocytes Absolute: 0.9 10*3/uL (ref 0.1–1.0)
Monocytes Relative: 10 %
Neutro Abs: 6.2 10*3/uL (ref 1.7–7.7)
Neutrophils Relative %: 70 %
Platelets: 156 10*3/uL (ref 150–400)
RBC: 4.08 MIL/uL — ABNORMAL LOW (ref 4.22–5.81)
RDW: 13.5 % (ref 11.5–15.5)
WBC: 8.8 10*3/uL (ref 4.0–10.5)
nRBC: 0 % (ref 0.0–0.2)

## 2020-02-05 LAB — CMP (CANCER CENTER ONLY)
ALT: 25 U/L (ref 0–44)
AST: 20 U/L (ref 15–41)
Albumin: 3.9 g/dL (ref 3.5–5.0)
Alkaline Phosphatase: 98 U/L (ref 38–126)
Anion gap: 6 (ref 5–15)
BUN: 20 mg/dL (ref 8–23)
CO2: 30 mmol/L (ref 22–32)
Calcium: 9.1 mg/dL (ref 8.9–10.3)
Chloride: 105 mmol/L (ref 98–111)
Creatinine: 1 mg/dL (ref 0.61–1.24)
GFR, Est AFR Am: >60 mL/min (ref >60)
GFR, Estimated: >60 mL/min (ref >60)
Glucose, Bld: 79 mg/dL (ref 70–99)
Potassium: 4.2 mmol/L (ref 3.5–5.1)
Sodium: 141 mmol/L (ref 135–145)
Total Bilirubin: 0.4 mg/dL (ref 0.3–1.2)
Total Protein: 6.3 g/dL — ABNORMAL LOW (ref 6.5–8.1)

## 2020-02-06 LAB — KAPPA/LAMBDA LIGHT CHAINS
Kappa free light chain: 8.9 mg/L (ref 3.3–19.4)
Kappa, lambda light chain ratio: 0.99 (ref 0.26–1.65)
Lambda free light chains: 9 mg/L (ref 5.7–26.3)

## 2020-02-06 IMAGING — CT CT ANGIO CHEST
2 of 6 series · 18 of 36 positions shown · IV contrast (omnipaque)
Comparison: 04/06/2019

CLINICAL DATA: Hypoxia, shortness of breath

EXAM:
CT ANGIOGRAPHY CHEST WITH CONTRAST
TECHNIQUE: Multidetector CT imaging of the chest was performed using the
standard protocol during bolus administration of intravenous
contrast. Multiplanar CT image reconstructions and MIPs were
obtained to evaluate the vascular anatomy.
CONTRAST:  66mL OMNIPAQUE IOHEXOL 350 MG/ML SOLN

[Series 7: pe thins · axial · 0.75mm/px · z∈[-321,-45]mm · 17 of 439 slices shown]
[im 22/439  lung]
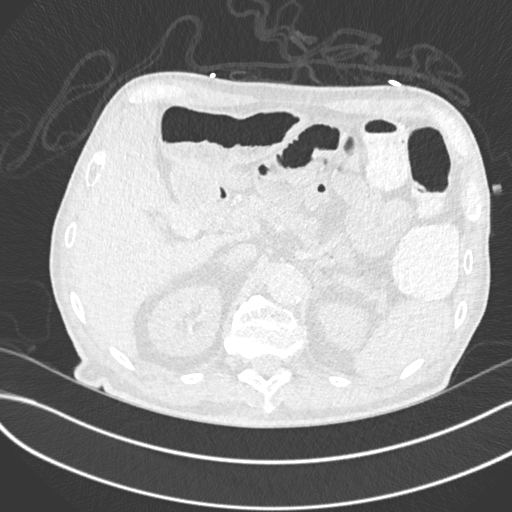
[im 44/439  mediastinal]
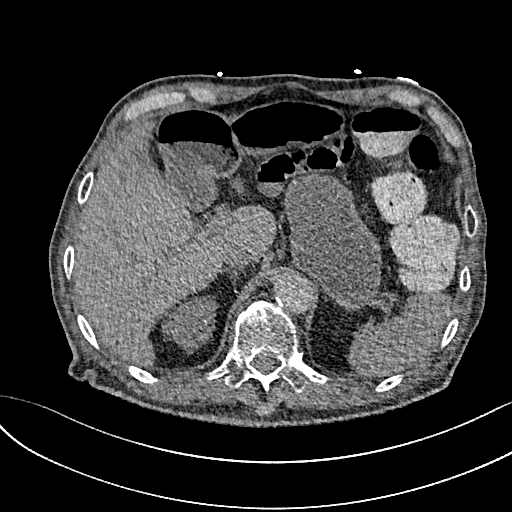
[im 66/439  lung]
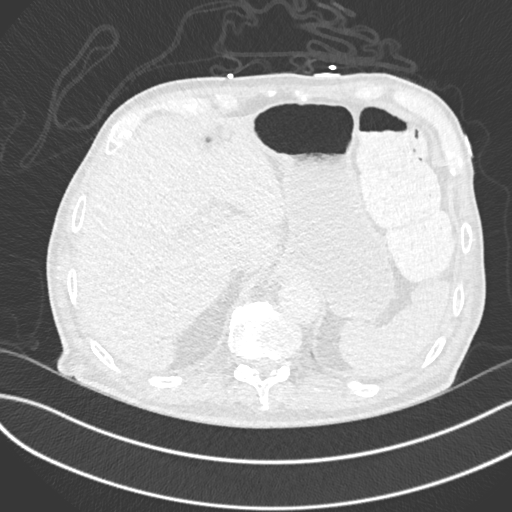
[im 88/439  mediastinal]
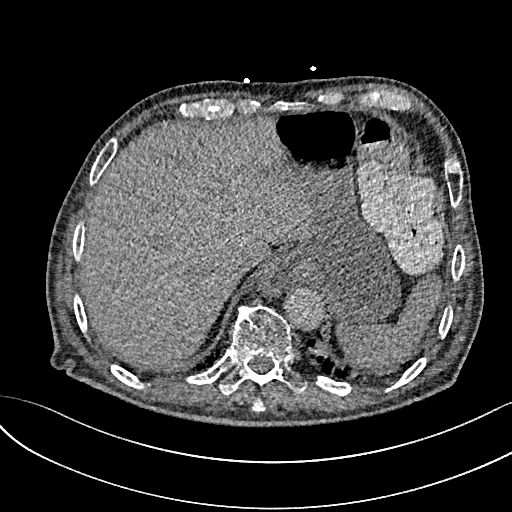
[im 132/439  lung]
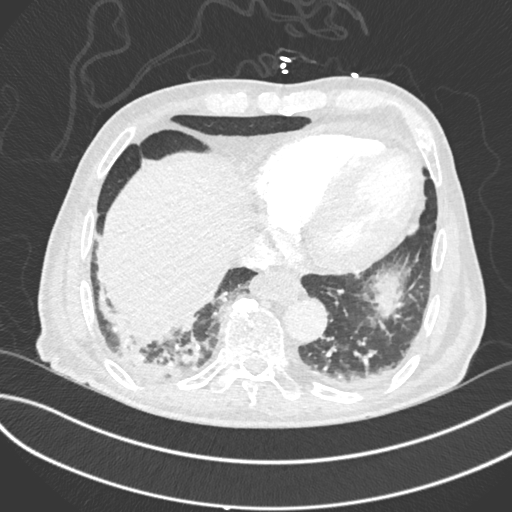
[im 154/439  mediastinal]
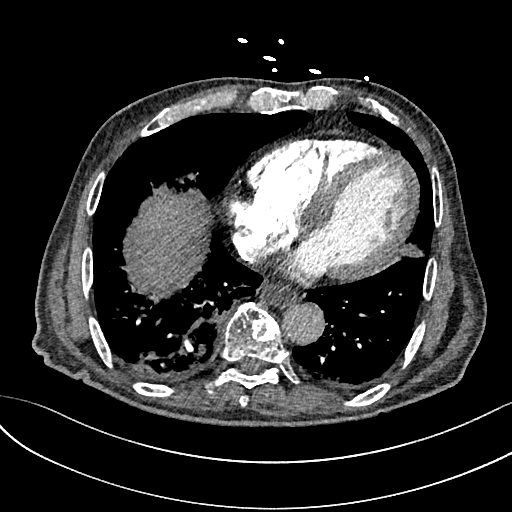
[im 176/439  lung]
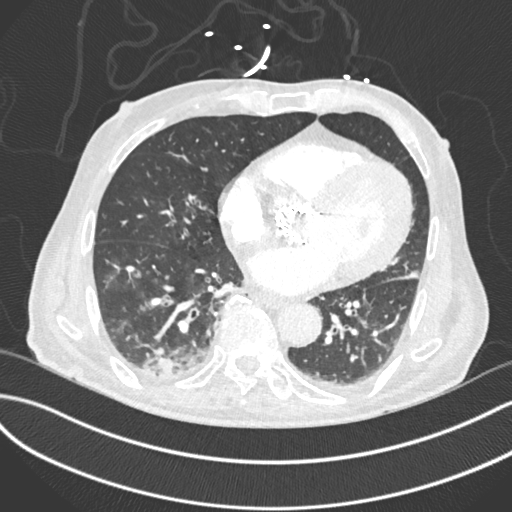
[im 198/439  mediastinal]
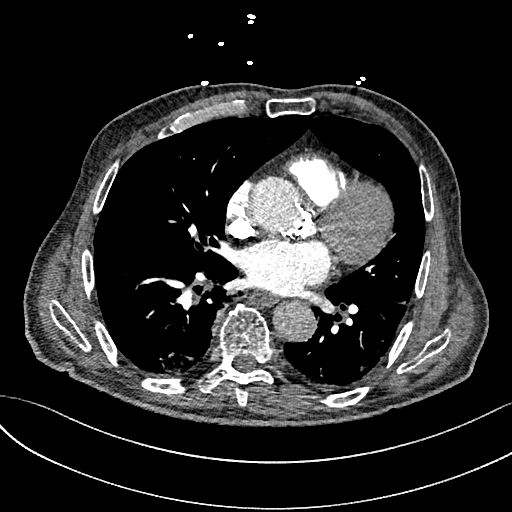
[im 220/439  lung]
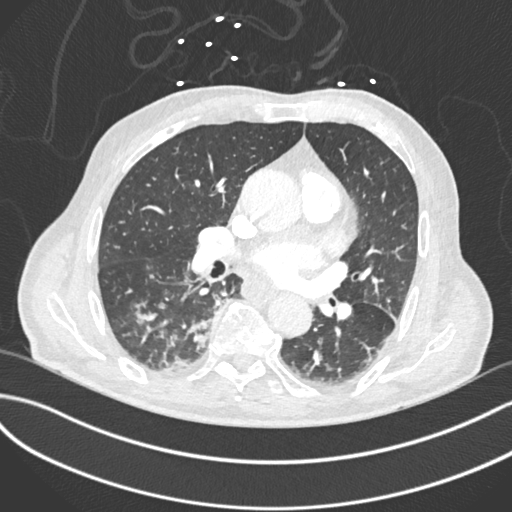
[im 241/439  mediastinal]
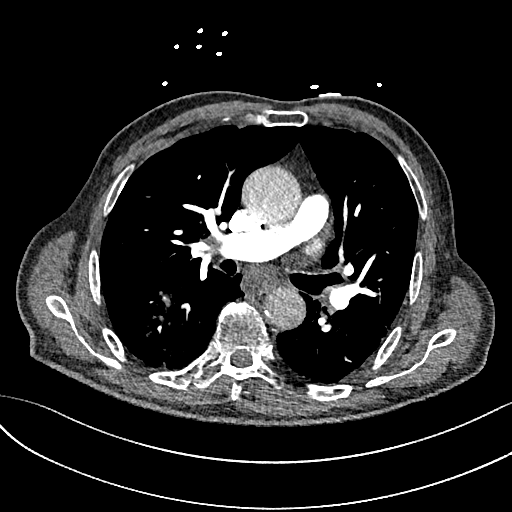
[im 263/439  lung]
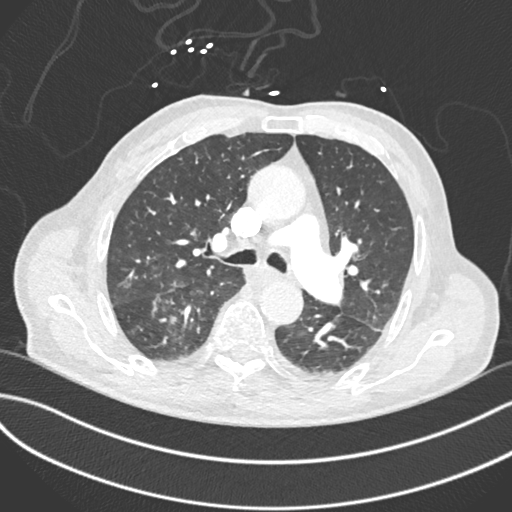
[im 285/439  mediastinal]
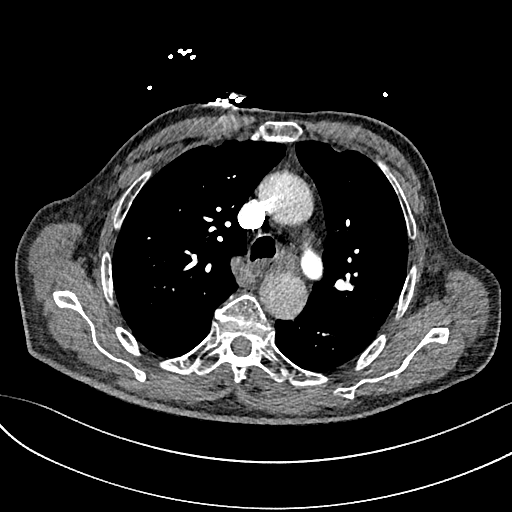
[im 307/439  lung]
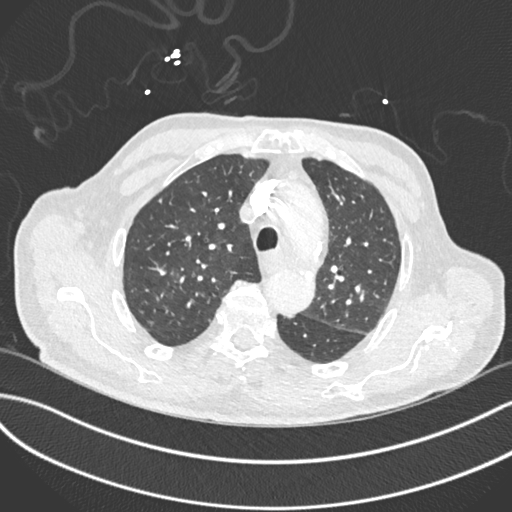
[im 351/439  mediastinal]
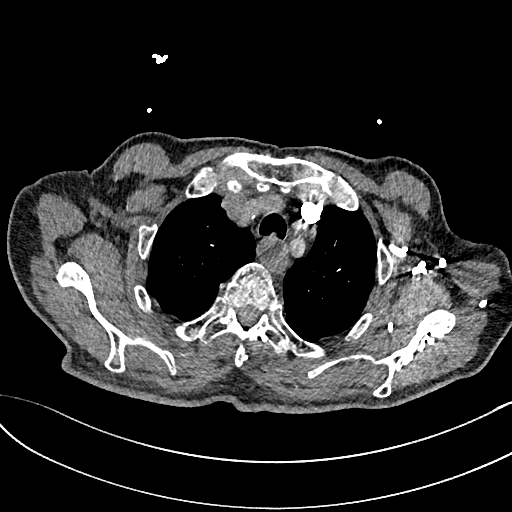
[im 373/439  lung]
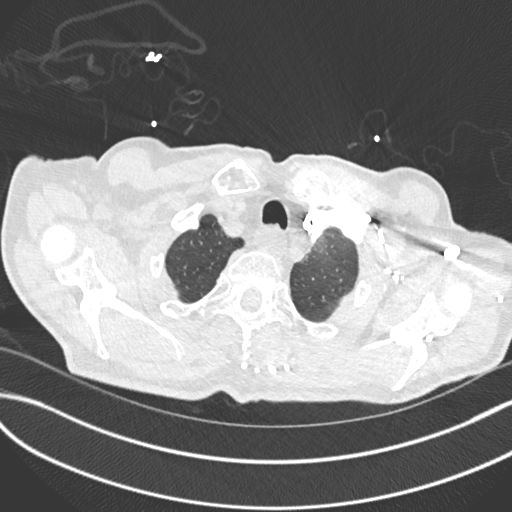
[im 395/439  mediastinal]
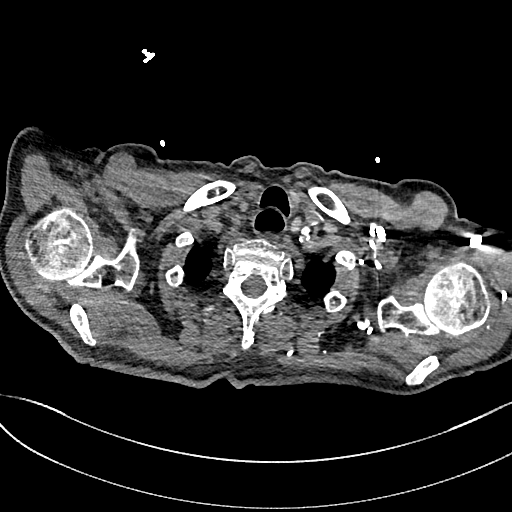
[im 417/439  lung]
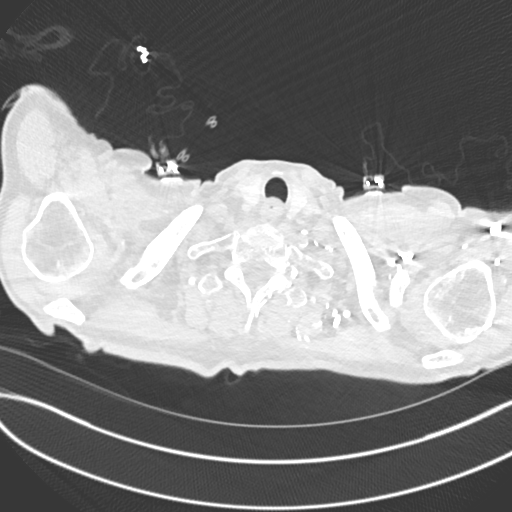

[Series 8: pe 2mm cor · coronal · 0.58mm/px · 1 of 137 slices shown]
[im 69/137  mediastinal]
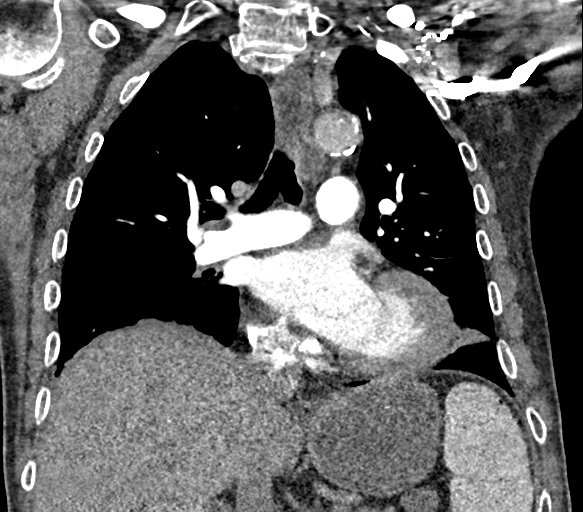

[18 of 36 positions shown; findings below may reference images not displayed]

FINDINGS: Cardiovascular:

Aortic Root:

--Valve: 3.4 cm

--Sinuses: 3.4 cm

--Sinotubular Junction: 3.4 cm

Limitations by motion: Mild

Thoracic Aorta:

--Ascending Aorta: 4.5 cm (stable since 03/14/2019)

--Aortic Arch: 3.2 cm (proximal)

--Descending Aorta: 3.3 cm

Other:

Heart size upper limits normal. Small pericardial effusion.
Satisfactory opacification of pulmonary arteries noted, and there is
no evidence of pulmonary emboli. Scattered coronary calcifications.
Interval TAVR.

Mediastinum/Nodes: No hilar or mediastinal adenopathy. Fluid
distends the esophagus.

Lungs/Pleura: No pleural effusion. No pneumothorax. Patchy airspace
opacities in the posterior segment right upper lobe, throughout the
right lower lobe, and to a minimal degree in the posterior left lung
base, new since previous.

Upper Abdomen: No acute findings.

Musculoskeletal: Anterior vertebral endplate spurring at multiple
levels in the mid and lower thoracic spine. T9 and T12 compression
deformities.

Review of the MIP images confirms the above findings.
IMPRESSION: 1. Negative for acute PE or thoracic aortic dissection.
2. New patchy airspace opacities predominantly in the right lower
lobe, also seen in the posterior upper lobe and to a minimal degree
in the posterior left lung base, likely infectious/inflammatory.
3. Fluid distends the esophagus suggesting reflux disease or
dysmotility.
4. Stable 4.5 cm ascending thoracic aortic aneurysm. Recommend
semi-annual imaging followup by CTA or MRA and referral to
cardiothoracic surgery if not already obtained. This recommendation
follows 4575 ACCF/AHA/AATS/ACR/ASA/SCA/GLASS/ELOI/BLAIN/ORE Guidelines
for the Diagnosis and Management of Patients With Thoracic Aortic
Disease. Circulation. 4575; 121: [id number]

## 2020-02-08 DIAGNOSIS — M25512 Pain in left shoulder: Secondary | ICD-10-CM | POA: Diagnosis not present

## 2020-02-08 DIAGNOSIS — M25612 Stiffness of left shoulder, not elsewhere classified: Secondary | ICD-10-CM | POA: Diagnosis not present

## 2020-02-10 ENCOUNTER — Other Ambulatory Visit: Payer: Self-pay | Admitting: Internal Medicine

## 2020-02-12 ENCOUNTER — Inpatient Hospital Stay: Payer: Medicare Other | Attending: Hematology | Admitting: Hematology

## 2020-02-12 ENCOUNTER — Other Ambulatory Visit: Payer: Self-pay

## 2020-02-12 ENCOUNTER — Inpatient Hospital Stay: Payer: Medicare Other

## 2020-02-12 VITALS — BP 107/68 | HR 66 | Temp 97.7°F | Resp 18 | Ht 71.0 in | Wt 165.3 lb

## 2020-02-12 DIAGNOSIS — G629 Polyneuropathy, unspecified: Secondary | ICD-10-CM

## 2020-02-12 DIAGNOSIS — C9000 Multiple myeloma not having achieved remission: Secondary | ICD-10-CM

## 2020-02-12 DIAGNOSIS — M1990 Unspecified osteoarthritis, unspecified site: Secondary | ICD-10-CM | POA: Diagnosis not present

## 2020-02-12 DIAGNOSIS — I110 Hypertensive heart disease with heart failure: Secondary | ICD-10-CM | POA: Insufficient documentation

## 2020-02-12 DIAGNOSIS — Z79899 Other long term (current) drug therapy: Secondary | ICD-10-CM | POA: Insufficient documentation

## 2020-02-12 DIAGNOSIS — Z7901 Long term (current) use of anticoagulants: Secondary | ICD-10-CM | POA: Diagnosis not present

## 2020-02-12 DIAGNOSIS — I509 Heart failure, unspecified: Secondary | ICD-10-CM | POA: Diagnosis not present

## 2020-02-12 DIAGNOSIS — R2689 Other abnormalities of gait and mobility: Secondary | ICD-10-CM | POA: Insufficient documentation

## 2020-02-12 DIAGNOSIS — K219 Gastro-esophageal reflux disease without esophagitis: Secondary | ICD-10-CM | POA: Insufficient documentation

## 2020-02-12 DIAGNOSIS — C7951 Secondary malignant neoplasm of bone: Secondary | ICD-10-CM

## 2020-02-12 DIAGNOSIS — E7800 Pure hypercholesterolemia, unspecified: Secondary | ICD-10-CM | POA: Insufficient documentation

## 2020-02-12 DIAGNOSIS — R600 Localized edema: Secondary | ICD-10-CM | POA: Diagnosis not present

## 2020-02-12 DIAGNOSIS — Z7189 Other specified counseling: Secondary | ICD-10-CM

## 2020-02-12 MED ORDER — ZOLEDRONIC ACID 4 MG/100ML IV SOLN
INTRAVENOUS | Status: AC
  Filled 2020-02-12: qty 100

## 2020-02-12 MED ORDER — SODIUM CHLORIDE 0.9 % IV SOLN
Freq: Once | INTRAVENOUS | Status: AC
  Filled 2020-02-12: qty 250

## 2020-02-12 MED ORDER — ZOLEDRONIC ACID 4 MG/100ML IV SOLN
4.0000 mg | Freq: Once | INTRAVENOUS | Status: AC
  Administered 2020-02-12: 4 mg via INTRAVENOUS

## 2020-02-12 NOTE — Progress Notes (Signed)
HEMATOLOGY/ONCOLOGY CLINIC NOTE  Date of Service: 02/12/2020  Patient Care Team: Cassandria Anger, MD as PCP - General Larey Dresser, MD as PCP - Advanced Heart Failure (Cardiology) Erline Levine, MD as Attending Physician (Neurosurgery) Jarome Matin, MD as Consulting Physician (Dermatology) Rigoberto Noel, MD as Consulting Physician (Pulmonary Disease) Alda Berthold, DO as Consulting Physician (Neurology)   CHIEF COMPLAINTS/PURPOSE OF CONSULTATION:   Plasma cell myeloma   HISTORY OF PRESENTING ILLNESS:  [name] [name] is a wonderful 79 y.o. male who has been referred to [place] by Angelena Form, PA for evaluation and management of lytic bone lesions. The pt reports that he is doing well overall.  The pt reports that he has occasional hip pain that radiates down his legs and prevents him from walking. He uses Advil, which helps his hip pain. He is used to exercising often, but has not been able to stay very physically active lately so he is gaining weight. The pt experiences some SOB when he wakes up in the morning.  The pt had a pre-procedural CTA C/A/P before a TAVR completed on 03/14/2019 which revealed "indeterminate osseus lesions in the bony pelvis," which led to an MRI and PET scan. He reports that he has pain when he pushes on his right chest.   He also notes that he had a fall while exercising in 06/2018 and thought he broke his back. He received a blood transfusion on 07/24/2018.   Of note prior to the patient's visit today, the pt has had a MRI pelvis w/wo contrast completed on 03/28/2019 with results revealing "1. Destructive bone lesions as detailed above. Findings most consistent with metastatic disease. PET-CT may be helpful for further evaluation and to establish a primary tumor. The right pelvic bone lesions should be amenable to image guided biopsy but a PET scan may demonstrate easier/safer biopsy sites. 2. No intrapelvic mass or adenopathy. 3. Benign  intraosseous lipoma involving the left anterior superior acetabulum."  The pt has also had PET whole body completed on 04/06/2019 with results revealing "1. Diffuse osseous metastatic disease as detailed above without findings for a primary neoplasm in the chest, abdomen or pelvis. The large destructive lesion involving the right ischium should be amenable to image guided biopsy. 2. Two small retroperitoneal lymph nodes and 1 small right obturator node showing hypermetabolism."   Most recent lab results (04/06/2019) of CBC is as follows: all values are WNL.  On review of systems, pt reports hip and leg pain, weight gain and denies syncope and denies belly pain, recent neuropathy and any other symptoms.   On PMHx the pt reports 5 cm hepatic flexure polyp removal, pneumonia, blood transfusion on 07/24/2018  On Social Hx the pt reports that he lives at home with his wife. He is from [place].    INTERVAL HISTORY:  [name] [name] is a wonderful 79 y.o. male who has who is here today for evaluation and management of his Plasma Cell Myeloma. Pt is accompanied by his wife. The patient's last visit with [place] was on 12/12/2019. The pt reports that he is doing well overall.  The pt reports that he is still experiencing burning in his lower legs and imbalance. He is currently using a walker. Pt was getting on a scale a month ago, fell, and broke his left shoulder. He had ORIF Proximal Humerus Fracture on 05/04. Pt was scheduled to begin PT just before the fall and was recommended to hold PT by his Orthopedist. Pt  is scheduled to see his Orthopedist in two weeks, who will consider giving him clearance to begin PT.   He is currently taking 50K U Vitamin D weekly and 2000 U Vitamin D daily. Pt continues to take Cymbalta and Gabapentin as prescribed and reports improved tingling in his fingers.   Pt had not had any recent dental issues and will not need any teeth removed in the near future.   Lab results  (02/05/20) of CBC w/diff and CMP is as follows: all values are WNL except for RBC at 4.08, Hgb at 12.4, HCT at 38.5, Abs Immature Granulocytes at 0.18K, Total Protein at 6.3.  02/05/2020 K/L light chains is as follows: Kappa free light chain at 8.9, Lamda free light chains at 9.0, K/L light chain ratio at 0.99  On review of systems, pt reports burning sensation in his legs, imbalance, leg swelling and denies sleeplessness, abdominal pain, constipation, diarrhea and any other symptoms.    MEDICAL HISTORY:  Past Medical History:  Diagnosis Date  . Ascending aortic aneurysm ([place])   . Bicuspid aortic valve   . Cancer ([place])   . CHF NYHA class I (no symptoms from ordinary activities), acute, diastolic ([place])   . Fatty liver    mild  . Fracture    left proximal humerus  . GERD (gastroesophageal reflux disease)   . GI bleeding 07/21/2018   post polypectomy  . Hemorrhoids   . HTN (hypertension)   . Hypercholesteremia   . Hypokalemia   . Internal hemorrhoids   . LBP (low back pain)   . Moderate aortic stenosis   . Osteoarthritis   . Paroxysmal atrial fibrillation ([place])    a. new onset Afib in 07/2008. He underwent ibutilide cardioversion successfully. b. Recurrence 01/2013 s/p TEE/DCCV - was on Xarelto but he stopped it as he was convinced it was causing joint pn. c. Recurrence 01/2016 - spont conv to NSR. Pt took Eliquis x1 mo then declined further anticoag. d. Recurrence 07/2016.  [name] Pneumonia   . Tubular adenoma of colon     SURGICAL HISTORY: Past Surgical History:  Procedure Laterality Date  . BACK SURGERY  x12 years ago  . CARDIOVERSION N/A 01/26/2013   Procedure: CARDIOVERSION;  Surgeon: Larey Dresser, MD;  Location[facility] ENDOSCOPY;  Service: Cardiovascular;  Laterality: N/A;  . CARDIOVERSION N/A 10/28/2017   Procedure: CARDIOVERSION;  Surgeon: Larey Dresser, MD;  Location: [facility] ENDOSCOPY;  Service: Cardiovascular;  Laterality: N/A;  . CARDIOVERSION N/A 03/03/2018   Procedure:  CARDIOVERSION;  Surgeon: Lelon Perla, MD;  Location: [facility] ENDOSCOPY;  Service: Cardiovascular;  Laterality: N/A;  . CARDIOVERSION N/A 09/19/2019   Procedure: CARDIOVERSION;  Surgeon: Larey Dresser, MD;  Location: [facility] ENDOSCOPY;  Service: Cardiovascular;  Laterality: N/A;  . COLONOSCOPY    . COLONOSCOPY  07/17/2018   at [facility]  . HEMORRHOID SURGERY    . LUMBAR LAMINECTOMY    . ORIF HUMERUS FRACTURE Left 01/15/2020   Procedure: OPEN REDUCTION INTERNAL FIXATION (ORIF) PROXIMAL HUMERUS FRACTURE;  Surgeon: Nicholes Stairs, MD;  Location: [place];  Service: Orthopedics;  Laterality: Left;  . POLYPECTOMY    . RIGHT HEART CATH N/A 08/20/2019   Procedure: RIGHT HEART CATH;  Surgeon: Larey Dresser, MD;  Location: [place] CV LAB;  Service: Cardiovascular;  Laterality: N/A;  . RIGHT/LEFT HEART CATH AND CORONARY ANGIOGRAPHY N/A 03/07/2019   Procedure: RIGHT/LEFT HEART CATH AND CORONARY ANGIOGRAPHY;  Surgeon: Burnell Blanks, MD;  Location: [place] CV LAB;  Service: Cardiovascular;  Laterality: N/A;  . [name]  04/2019  . TEE WITHOUT CARDIOVERSION N/A 01/26/2013   Procedure: TRANSESOPHAGEAL ECHOCARDIOGRAM (TEE);  Surgeon: Larey Dresser, MD;  Location: [place];  Service: Cardiovascular;  Laterality: N/A;  . TEE WITHOUT CARDIOVERSION N/A 10/28/2017   Procedure: TRANSESOPHAGEAL ECHOCARDIOGRAM (TEE);  Surgeon: Larey Dresser, MD;  Location: [facility] ENDOSCOPY;  Service: Cardiovascular;  Laterality: N/A;  . TEE WITHOUT CARDIOVERSION N/A 05/08/2019   Procedure: TRANSESOPHAGEAL ECHOCARDIOGRAM (TEE);  Surgeon: Burnell Blanks, MD;  Location: [place] CV LAB;  Service: Open Heart Surgery;  Laterality: N/A;  . TEE WITHOUT CARDIOVERSION N/A 09/19/2019   Procedure: TRANSESOPHAGEAL ECHOCARDIOGRAM (TEE);  Surgeon: Larey Dresser, MD;  Location[facility] ENDOSCOPY;  Service: Cardiovascular;  Laterality: N/A;  . TRANSCATHETER AORTIC VALVE REPLACEMENT, TRANSFEMORAL N/A 05/08/2019   Procedure:  TRANSCATHETER AORTIC VALVE REPLACEMENT, TRANSFEMORAL;  Surgeon: Burnell Blanks, MD;  Location: [place] CV LAB;  Service: Open Heart Surgery;  Laterality: N/A;    SOCIAL HISTORY: Social History   Socioeconomic History  . Marital status: Married    Spouse name: Not on file  . Number of children: 0  . Years of education: Not on file  . Highest education level: Not on file  Occupational History  . Occupation: Retired Lobbyist: [employer]  Tobacco Use  . Smoking status: Never Smoker  . Smokeless tobacco: Never Used  Substance and Sexual Activity  . Alcohol use: Not Currently  . Drug use: No  . Sexual activity: Yes  Other Topics Concern  . Not on file  Social History Narrative   Patient lives in [place] w/ his wife. He is a native of [place]. 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   Right-handed   Caffeine: 2 cups coffee per day   Two story home   Social Determinants of Health   Financial Resource Strain:   . Difficulty of Paying Living Expenses:   Food Insecurity:   . Worried About Charity fundraiser in the Last Year:   . Arboriculturist in the Last Year:   Transportation Needs:   . Film/video editor (Medical):   [name] Lack of Transportation (Non-Medical):   Physical Activity:   . Days of Exercise per Week:   . Minutes of Exercise per Session:   Stress:   . Feeling of Stress :   Social Connections:   . Frequency of Communication with Friends and Family:   . Frequency of Social Gatherings with Friends and Family:   . Attends Religious Services:   . Active Member of Clubs or Organizations:   . Attends Archivist Meetings:   [name] Marital Status:   Intimate Partner Violence:   . Fear of Current or Ex-Partner:   . Emotionally Abused:   [name] Physically Abused:   . Sexually Abused:     FAMILY HISTORY: Family History  Problem Relation Age of Onset  . Colon cancer Mother 92  .  Hypertension Other   . Coronary artery disease Neg Hx   . Colon polyps Neg Hx   . Esophageal cancer Neg Hx   . Rectal cancer Neg Hx   . Stomach cancer Neg Hx     ALLERGIES:  is allergic to xarelto [rivaroxaban]; ambien [zolpidem]; corticosteroids; ramipril; and benazepril.  MEDICATIONS:  Current Outpatient Medications  Medication Sig Dispense Refill  . acyclovir (ZOVIRAX) 400 MG tablet Take 1 tablet (400  mg total) by mouth 2 (two) times daily. 180 tablet 3  . amiodarone (PACERONE) 200 MG tablet TAKE 1 TABLET BY MOUTH EVERY DAY 30 tablet 5  . amLODipine (NORVASC) 5 MG tablet Take 1 tablet (5 mg total) by mouth daily. 90 tablet 3  . apixaban (ELIQUIS) 5 MG TABS tablet Take 5 mg by mouth 2 (two) times daily.    [name] b complex vitamins tablet Take 1 tablet by mouth daily. 100 tablet 3  . Calcium-Magnesium 500-250 MG TABS Take 1 tablet by mouth daily.    . Carboxymethylcellul-Glycerin (LUBRICATING EYE DROPS OP) Place 1 drop into both eyes daily as needed (dry eyes).    . Cholecalciferol (VITAMIN D) 50 MCG (2000 UT) tablet Take 2,000 Units by mouth daily.    [name] dexamethasone (DECADRON) 4 MG tablet Take 3 tablets (12 mg total) by mouth once a week. 22 tablet 1  . DULoxetine (CYMBALTA) 60 MG capsule TAKE 1 CAPSULE BY MOUTH EVERY DAY (Patient taking differently: Take 60 mg by mouth daily. ) 90 capsule 1  . ergocalciferol (VITAMIN D2) 1.25 MG (50000 UT) capsule Take 50,000 Units by mouth once a week. Every Monday    . famotidine (PEPCID) 40 MG tablet TAKE 1 TABLET BY MOUTH EVERY DAY 90 tablet 3  . furosemide (LASIX) 20 MG tablet TAKE 3 TABLETS BY MOUTH EVERY DAY (Patient taking differently: Take 20 mg by mouth every other day. ) 270 tablet 3  . gabapentin (NEURONTIN) 300 MG capsule Take 1 capsule (300 mg total) by mouth 3 (three) times daily.    [name] loratadine (CLARITIN) 10 MG tablet TAKE 1 TABLET BY MOUTH EVERY DAY (Patient not taking: No sig reported) 90 tablet 3  . LORazepam (ATIVAN) 0.5 MG tablet  Take 1 tablet (0.5 mg total) by mouth every 8 (eight) hours as needed (Nausea or vomiting). (Patient taking differently: Take 0.5 mg by mouth at bedtime. ) 60 tablet 2  . metoprolol succinate (TOPROL-XL) 25 MG 24 hr tablet TAKE 1 TABLET BY MOUTH TWICE A DAY (Patient taking differently: Take 25 mg by mouth in the morning and at bedtime. ) 180 tablet 3  . oxyCODONE (OXY IR/ROXICODONE) 5 MG immediate release tablet Take 2 tablets (10 mg total) by mouth every 4 (four) hours as needed for moderate pain or severe pain. (Patient taking differently: Take 10 mg by mouth every 4 (four) hours as needed for moderate pain or severe pain. Patient takes 1 in the morning and 1 at bedtime.) 40 tablet 0  . polyethylene glycol powder (GLYCOLAX/MIRALAX) 17 GM/SCOOP powder Take 17-34 g by mouth 2 (two) times daily as needed for moderate constipation. 500 g 5  . senna-docusate (SENOKOT-S) 8.6-50 MG tablet Take 2 tablets by mouth at bedtime.    [name] spironolactone (ALDACTONE) 25 MG tablet Take 1 tablet (25 mg total) by mouth daily. 30 tablet 3   No current facility-administered medications for this visit.    REVIEW OF SYSTEMS:   A 10+ POINT REVIEW OF SYSTEMS WAS OBTAINED including neurology, dermatology, psychiatry, cardiac, respiratory, lymph, extremities, GI, GU, Musculoskeletal, constitutional, breasts, reproductive, HEENT.  All pertinent positives are noted in the HPI.  All others are negative.   PHYSICAL EXAMINATION: Vitals:   02/12/20 1035  BP: 107/68  Pulse: 66  Resp: 18  Temp: 97.7 F (36.5 C)  SpO2: 100%   Wt Readings from Last 3 Encounters:  02/12/20 165 lb 4.8 oz (75 kg)  01/22/20 167 lb (75.8 kg)  01/15/20 162 lb (73.5  kg)   Body mass index is 23.05 kg/m.    ECOG FS:2 - Symptomatic, <50% confined to bed  Exam was given in a chair   GENERAL:alert, in no acute distress and comfortable SKIN: no acute rashes, no significant lesions EYES: conjunctiva are pink and non-injected, sclera  anicteric OROPHARYNX: MMM, no exudates, no oropharyngeal erythema or ulceration NECK: supple, no JVD LYMPH:  no palpable lymphadenopathy in the cervical, axillary or inguinal regions LUNGS: clear to auscultation b/l with normal respiratory effort HEART: regular rate & rhythm ABDOMEN:  normoactive bowel sounds, non tender, not distended. No palpable hepatosplenomegaly.  Extremity: no pedal edema PSYCH: alert & oriented x 3 with fluent speech NEURO: no focal motor/sensory deficits  LABORATORY DATA:  I have reviewed the data as listed  . CBC Latest Ref Rng & Units 02/05/2020 01/22/2020 01/07/2020  WBC 4.0 - 10.5 K/uL 8.8 9.2 9.7  Hemoglobin 13.0 - 17.0 g/dL 12.4(L) 12.7(L) 12.3(L)  Hematocrit 39.0 - 52.0 % 38.5(L) 39.5 38.5(L)  Platelets 150 - 400 K/uL 156 233 122(L)    . CMP Latest Ref Rng & Units 02/05/2020 01/22/2020 01/07/2020  Glucose 70 - 99 mg/dL 79 106(H) 114(H)  BUN 8 - 23 mg/dL _0 Creatinine 0.61 - 1.24 mg/dL 1.00 0.98 1.22  Sodium 135 - 145 mmol/L 141 140 139  Potassium 3.5 - 5.1 mmol/L 4.2 4.3 4.5  Chloride 98 - 111 mmol/L 105 105 105  CO2 22 - 32 mmol/L _1 Calcium 8.9 - 10.3 mg/dL 9.1 9.3 9.1  Total Protein 6.5 - 8.1 g/dL 6.3(L) 6.1(L) -  Total Bilirubin 0.3 - 1.2 mg/dL 0.4 0.6 -  Alkaline Phos 38 - 126 U/L 98 84 -  AST 15 - 41 U/L 20 23 -  ALT 0 - 44 U/L 25 26 -   Component     Latest Ref Rng & Units 04/30/2019  Total Protein, Urine-UPE24     Not Estab. mg/dL   Total Protein, Urine-Ur/day     30 - 150 mg/24 hr   ALBUMIN, U     %   ALPHA 1 URINE     %   Alpha 2, Urine     %   % BETA, Urine     %   GAMMA GLOBULIN URINE     %   Free Kappa Lt Chains,Ur     0.63 - 113.79 mg/L   Free Lambda Lt Chains,Ur     0.47 - 11.77 mg/L   Free Kappa/Lambda Ratio     1.03 - 31.76   Immunofixation Result, Urine        Total Volume        M-SPIKE %, Urine     Not Observed %   M-Spike, mg/24 hr     Not Observed mg/24 hr   NOTE:        IgG  (Immunoglobin G), Serum     603 - 1,613 mg/dL 1,740 (H)  IgA     61 - 437 mg/dL 16 (L)  IgM (Immunoglobulin M), Srm     15 - 143 mg/dL 9 (L)  Total Protein ELP     6.0 - 8.5 g/dL 7.3  Albumin SerPl Elph-Mcnc     2.9 - 4.4 g/dL 3.8  Alpha 1     0.0 - 0.4 g/dL 0.3  Alpha2 Glob SerPl Elph-Mcnc     0.4 - 1.0 g/dL 0.7  B-Globulin SerPl Elph-Mcnc     0.7 - 1.3 g/dL  0.9  Gamma Glob SerPl Elph-Mcnc     0.4 - 1.8 g/dL 1.6  M Protein SerPl Elph-Mcnc     Not Observed g/dL 1.5 (H)  Globulin, Total     2.2 - 3.9 g/dL 3.5  Albumin/Glob SerPl     0.7 - 1.7 1.1  IFE 1      Comment  Please Note (HCV):      Comment  Kappa free light chain     3.3 - 19.4 mg/L 821.2 (H)  Lamda free light chains     5.7 - 26.3 mg/L 1.9 (L)  Kappa, lamda light chain ratio     0.26 - 1.65 432.21 (H)  LDH     98 - 192 U/L 132  Sed Rate     0 - 16 mm/hr 3   05/24/2019 FISH Panel     05/24/2019 Bone Marrow Biopsy    04/16/2019 Surgical Pathology:   RADIOGRAPHIC STUDIES: I have personally reviewed the radiological images as listed and agreed with the findings in the report. DG Humerus Left  Result Date: 01/15/2020 CLINICAL DATA:  ORIF EXAM: DG C-ARM 1-60 MIN; LEFT HUMERUS - 2+ VIEW CONTRAST:  None FLUOROSCOPY TIME:  Fluoroscopy Time:  33 seconds Number of Acquired Spot Images: 3 COMPARISON:  01/07/2020 FINDINGS: Three low resolution intraoperative spot views of the left humerus. The images demonstrate surgical plate and multiple screw fixation of proximal humerus fracture. IMPRESSION: Intraoperative fluoroscopic assistance provided during surgical fixation of left humerus fracture. Electronically Signed   By: Donavan Foil M.D.   On: 01/15/2020 16:55   DG C-Arm 1-60 Min  Result Date: 01/15/2020 CLINICAL DATA:  ORIF EXAM: DG C-ARM 1-60 MIN; LEFT HUMERUS - 2+ VIEW CONTRAST:  None FLUOROSCOPY TIME:  Fluoroscopy Time:  33 seconds Number of Acquired Spot Images: 3 COMPARISON:  01/07/2020 FINDINGS: Three low  resolution intraoperative spot views of the left humerus. The images demonstrate surgical plate and multiple screw fixation of proximal humerus fracture. IMPRESSION: Intraoperative fluoroscopic assistance provided during surgical fixation of left humerus fracture. Electronically Signed   By: Donavan Foil M.D.   On: 01/15/2020 16:55    ASSESSMENT & PLAN:   79 yo   #1 Recently diagnosed Plasma cell myeloma  03/28/2019 MRI pelvis w/wo contrast revealed "1. Destructive bone lesions as detailed above. Findings most consistent with metastatic disease. PET-CT may be helpful for further evaluation and to establish a primary tumor. The right pelvic bone lesions should be amenable to image guided biopsy but a PET scan may demonstrate easier/safer biopsy sites. 2. No intrapelvic mass or adenopathy. 3. Benign intraosseous lipoma involving the left anterior superior acetabulum."  04/06/2019 PET whole body revealed "1. Diffuse osseous metastatic disease as detailed above without findings for a primary neoplasm in the chest, abdomen or pelvis. The large destructive lesion involving the right ischium should be amenable to image guided biopsy. 2. Two small retroperitoneal lymph nodes and 1 small right obturator node showing hypermetabolism."   04/16/2019 Posterior right pelvis bone biopsy revealed "PLASMA CELL NEOPLASM"  05/24/2019 Bone Marrow Biopsy revealed "BONE MARROW: - CELLULAR MARROW WITH INVOLVEMENT BY PLASMA CELL NEOPLASM (20%) PERIPHERAL BLOOD: - MORPHOLOGICALLY UNREMARKABLE"  05/24/2019 FISH Panel revealed "ABNORMAL result with 11q+, 14q+ and +17"  #2 Severe aortic stenosis with bicuspid aortic valve -10/26/2018 ECHO revealed AVA at 0.8 cm2 and LV EF of 60-65% -05/08/2019 pt had a Transfemoral Transcatheter Aortic Valve Replacement   PLAN: -Discussed pt labwork, 02/05/20; blood counts and chemistries look good, K/L light  chains are all WNL -Discussed CRAB criteria: no hypercalcemia, no renal  dysfunction, no anemia, no bone lesions identified -No lab or clinical evidence of Multiple Myeloma recurrence at this time. No indication to restart treatment.  -Recommend pt use walker when moving about to prevent additional falls -Advised pt that there are no contraindications for completing a hernia surgery from a Multiple Myeloma standpoint, but that PT and recent left shoulder fracture may need to take higher precedent. -Continue f/u with Orthopedist for PT clearance. Will refer pt back to PT at that time.  -Continue daily/weekly Vitamin D and dietary Calcium   -Will begin Zometa q8weeks today -Will see back in 2 months with labs    FOLLOW UP: Plz schedule Zometa every 8 weeks for next 4 doses RTC with Dr Irene Limbo with labs in 8 weeks with next zometa dose    The total time spent in the appt was 20 minutes and more than 50% was on counseling and direct patient cares.  All of the patient's questions were answered with apparent satisfaction. The patient knows to call the clinic with any problems, questions or concerns.   Sullivan Lone MD [place] AAHIVMS [facility] Hematology/Oncology Physician [facility]  (Office):       [phone] (Work cell):  [phone] (Fax):           [phone]  02/12/2020 11:33 AM  I, Yevette Edwards, am acting as a scribe for Dr. Sullivan Lone.   .I have reviewed the above documentation for accuracy and completeness, and I agree with the above. Brunetta Genera MD

## 2020-02-12 NOTE — Patient Instructions (Signed)
Zoledronic Acid injection (Hypercalcemia, Oncology) What is this medicine? ZOLEDRONIC ACID (ZOE le dron ik AS id) lowers the amount of calcium loss from bone. It is used to treat too much calcium in your blood from cancer. It is also used to prevent complications of cancer that has spread to the bone. This medicine may be used for other purposes; ask your health care provider or pharmacist if you have questions. COMMON BRAND NAME(S): Zometa What should I tell my health care provider before I take this medicine? They need to know if you have any of these conditions:  aspirin-sensitive asthma  cancer, especially if you are receiving medicines used to treat cancer  dental disease or wear dentures  infection  kidney disease  receiving corticosteroids like dexamethasone or prednisone  an unusual or allergic reaction to zoledronic acid, other medicines, foods, dyes, or preservatives  pregnant or trying to get pregnant  breast-feeding How should I use this medicine? This medicine is for infusion into a vein. It is given by a health care professional in a hospital or clinic setting. Talk to your pediatrician regarding the use of this medicine in children. Special care may be needed. Overdosage: If you think you have taken too much of this medicine contact a poison control center or emergency room at once. NOTE: This medicine is only for you. Do not share this medicine with others. What if I miss a dose? It is important not to miss your dose. Call your doctor or health care professional if you are unable to keep an appointment. What may interact with this medicine?  certain antibiotics given by injection  NSAIDs, medicines for pain and inflammation, like ibuprofen or naproxen  some diuretics like bumetanide, furosemide  teriparatide  thalidomide This list may not describe all possible interactions. Give your health care provider a list of all the medicines, herbs, non-prescription  drugs, or dietary supplements you use. Also tell them if you smoke, drink alcohol, or use illegal drugs. Some items may interact with your medicine. What should I watch for while using this medicine? Visit your doctor or health care professional for regular checkups. It may be some time before you see the benefit from this medicine. Do not stop taking your medicine unless your doctor tells you to. Your doctor may order blood tests or other tests to see how you are doing. Women should inform their doctor if they wish to become pregnant or think they might be pregnant. There is a potential for serious side effects to an unborn child. Talk to your health care professional or pharmacist for more information. You should make sure that you get enough calcium and vitamin D while you are taking this medicine. Discuss the foods you eat and the vitamins you take with your health care professional. Some people who take this medicine have severe bone, joint, and/or muscle pain. This medicine may also increase your risk for jaw problems or a broken thigh bone. Tell your doctor right away if you have severe pain in your jaw, bones, joints, or muscles. Tell your doctor if you have any pain that does not go away or that gets worse. Tell your dentist and dental surgeon that you are taking this medicine. You should not have major dental surgery while on this medicine. See your dentist to have a dental exam and fix any dental problems before starting this medicine. Take good care of your teeth while on this medicine. Make sure you see your dentist for regular follow-up   appointments. What side effects may I notice from receiving this medicine? Side effects that you should report to your doctor or health care professional as soon as possible:  allergic reactions like skin rash, itching or hives, swelling of the face, lips, or tongue  anxiety, confusion, or depression  breathing problems  changes in vision  eye  pain  feeling faint or lightheaded, falls  jaw pain, especially after dental work  mouth sores  muscle cramps, stiffness, or weakness  redness, blistering, peeling or loosening of the skin, including inside the mouth  trouble passing urine or change in the amount of urine Side effects that usually do not require medical attention (report to your doctor or health care professional if they continue or are bothersome):  bone, joint, or muscle pain  constipation  diarrhea  fever  hair loss  irritation at site where injected  loss of appetite  nausea, vomiting  stomach upset  trouble sleeping  trouble swallowing  weak or tired This list may not describe all possible side effects. Call your doctor for medical advice about side effects. You may report side effects to FDA at 1-800-FDA-1088. Where should I keep my medicine? This drug is given in a hospital or clinic and will not be stored at home. NOTE: This sheet is a summary. It may not cover all possible information. If you have questions about this medicine, talk to your doctor, pharmacist, or health care provider.  2020 [employer] (2014-01-26 14:19:39)  

## 2020-02-13 DIAGNOSIS — M25612 Stiffness of left shoulder, not elsewhere classified: Secondary | ICD-10-CM | POA: Diagnosis not present

## 2020-02-13 DIAGNOSIS — M25512 Pain in left shoulder: Secondary | ICD-10-CM | POA: Diagnosis not present

## 2020-02-15 DIAGNOSIS — M25612 Stiffness of left shoulder, not elsewhere classified: Secondary | ICD-10-CM | POA: Diagnosis not present

## 2020-02-15 DIAGNOSIS — M25512 Pain in left shoulder: Secondary | ICD-10-CM | POA: Diagnosis not present

## 2020-02-19 DIAGNOSIS — M25612 Stiffness of left shoulder, not elsewhere classified: Secondary | ICD-10-CM | POA: Diagnosis not present

## 2020-02-19 DIAGNOSIS — M25512 Pain in left shoulder: Secondary | ICD-10-CM | POA: Diagnosis not present

## 2020-02-22 DIAGNOSIS — M25612 Stiffness of left shoulder, not elsewhere classified: Secondary | ICD-10-CM | POA: Diagnosis not present

## 2020-02-22 DIAGNOSIS — M25512 Pain in left shoulder: Secondary | ICD-10-CM | POA: Diagnosis not present

## 2020-02-29 ENCOUNTER — Other Ambulatory Visit (HOSPITAL_COMMUNITY): Payer: Self-pay | Admitting: Cardiology

## 2020-03-05 ENCOUNTER — Other Ambulatory Visit: Payer: Self-pay | Admitting: Surgery

## 2020-03-11 NOTE — Progress Notes (Signed)
[id number] - [place], [address] AT [address] [place] [place] [address] Phone: [phone] Fax: [phone]  [id number] - [place], [address] [place] [address] Phone: [phone] Fax: [phone]  [facility] [place], [place] - [address] [place] [address] Phone: [phone] Fax: [phone]  [facility], [place] - [address] [place] [address] Phone: [phone] Fax: [phone]      Your procedure is scheduled on July 6  Report to [facility] Main Entrance "A" at 1300 P.M., and check in at the Admitting office.  Call this number if you have problems the morning of surgery:  [phone]  Call [phone] if you have any questions prior to your surgery date Monday-Friday 8am-4pm    Remember:  Do not eat after midnight the night before your surgery  You may drink clear liquids until 1200 pm the afternoon of your surgery.   Clear liquids allowed are: Water, Non-Citrus Juices (without pulp), Carbonated Beverages, Clear Tea, Black Coffee Only, and Gatorade    Take these medicines the morning of surgery with A SIP OF WATER  acyclovir (ZOVIRAX) amiodarone (PACERONE)  amLODipine (NORVASC) dexamethasone (DECADRON) DULoxetine (CYMBALTA)  famotidine (PEPCID) gabapentin (NEURONTIN) loratadine (CLARITIN) metoprolol succinate (TOPROL-XL) oxyCODONE (OXY IR/ROXICODONE if needed  As of today, STOP taking any Aspirin (unless otherwise instructed by your surgeon) and Aspirin containing products, Aleve, Naproxen, Ibuprofen, Motrin, Advil, Goody's, BC's, all herbal medications, fish oil, and all vitamins.                      Do not wear jewelry            Do not wear lotions, powders, colognes, or deodorant.            Men may shave face and  neck.            Do not bring valuables to the hospital.            [facility] is not responsible for any belongings or valuables.  Do NOT Smoke (Tobacco/Vapping) or drink Alcohol 24 hours prior to your procedure If you use a CPAP at night, you may bring all equipment for your overnight stay.   Contacts, glasses, dentures or bridgework may not be worn into surgery.      For patients admitted to the hospital, discharge time will be determined by your treatment team.   Patients discharged the day of surgery will not be allowed to drive home, and someone needs to stay with them for 24 hours.    Special instructions:   [facility]- Preparing For Surgery  Before surgery, you can play an important role. Because skin is not sterile, your skin needs to be as free of germs as possible. You can reduce the number of germs on your skin by washing with CHG (chlorahexidine gluconate) Soap before surgery.  CHG is an antiseptic cleaner which kills germs and bonds with the skin to continue killing germs even after washing.    Oral Hygiene is also important to reduce your risk of infection.  Remember - BRUSH YOUR TEETH THE MORNING OF SURGERY WITH YOUR REGULAR TOOTHPASTE  Please do not use if you have an allergy to CHG or antibacterial soaps. If your skin becomes reddened/irritated stop  using the CHG.  Do not shave (including legs and underarms) for at least 48 hours prior to first CHG shower. It is OK to shave your face.  Please follow these instructions carefully.   1. Shower the NIGHT BEFORE SURGERY and the MORNING OF SURGERY with CHG Soap.   2. If you chose to wash your hair, wash your hair first as usual with your normal shampoo.  3. After you shampoo, rinse your hair and body thoroughly to remove the shampoo.  4. Use CHG as you would any other liquid soap. You can apply CHG directly to the skin and wash gently with a scrungie or a clean washcloth.   5. Apply the CHG Soap to your body ONLY FROM  THE NECK DOWN.  Do not use on open wounds or open sores. Avoid contact with your eyes, ears, mouth and genitals (private parts). Wash Face and genitals (private parts)  with your normal soap.   6. Wash thoroughly, paying special attention to the area where your surgery will be performed.  7. Thoroughly rinse your body with warm water from the neck down.  8. DO NOT shower/wash with your normal soap after using and rinsing off the CHG Soap.  9. Pat yourself dry with a CLEAN TOWEL.  10. Wear CLEAN PAJAMAS to bed the night before surgery, wear comfortable clothes the morning of surgery  11. Place CLEAN SHEETS on your bed the night of your first shower and DO NOT SLEEP WITH PETS.   Day of Surgery:   Do not apply any deodorants/lotions.  Please wear clean clothes to the hospital/surgery center.   Remember to brush your teeth WITH YOUR REGULAR TOOTHPASTE.   Please read over the following fact sheets that you were given.

## 2020-03-12 ENCOUNTER — Encounter (HOSPITAL_COMMUNITY): Payer: Self-pay

## 2020-03-12 ENCOUNTER — Other Ambulatory Visit: Payer: Self-pay

## 2020-03-12 ENCOUNTER — Encounter (HOSPITAL_COMMUNITY)
Admission: RE | Admit: 2020-03-12 | Discharge: 2020-03-12 | Disposition: A | Discharge status: Home / Self Care | Payer: Medicare Other | Source: Ambulatory Visit | Attending: Surgery | Admitting: Surgery

## 2020-03-12 DIAGNOSIS — Z01812 Encounter for preprocedural laboratory examination: Secondary | ICD-10-CM | POA: Insufficient documentation

## 2020-03-12 LAB — BASIC METABOLIC PANEL
Anion gap: 8 (ref 5–15)
BUN: 31 mg/dL — ABNORMAL HIGH (ref 8–23)
CO2: 24 mmol/L (ref 22–32)
Calcium: 9.5 mg/dL (ref 8.9–10.3)
Chloride: 107 mmol/L (ref 98–111)
Creatinine, Ser: 0.97 mg/dL (ref 0.61–1.24)
GFR calc Af Amer: >60 mL/min (ref >60)
GFR calc non Af Amer: >60 mL/min (ref >60)
Glucose, Bld: 110 mg/dL — ABNORMAL HIGH (ref 70–99)
Potassium: 4.7 mmol/L (ref 3.5–5.1)
Sodium: 139 mmol/L (ref 135–145)

## 2020-03-12 LAB — CBC
HCT: 40.4 % (ref 39.0–52.0)
Hemoglobin: 12.9 g/dL — ABNORMAL LOW (ref 13.0–17.0)
MCH: 29.7 pg (ref 26.0–34.0)
MCHC: 31.9 g/dL (ref 30.0–36.0)
MCV: 92.9 fL (ref 80.0–100.0)
Platelets: 178 10*3/uL (ref 150–400)
RBC: 4.35 MIL/uL (ref 4.22–5.81)
RDW: 13.2 % (ref 11.5–15.5)
WBC: 19.9 10*3/uL — ABNORMAL HIGH (ref 4.0–10.5)
nRBC: 0 % (ref 0.0–0.2)

## 2020-03-12 NOTE — Progress Notes (Addendum)
PCP - Dr. Alain Marion with [facility] Cardiologist - Dr. Aundra Dubin with [facility]  Chest x-ray - Not indicated EKG - 01/22/20 Stress Test - Yes unsure year ECHO - 09/19/19 Cardiac Cath - 03/07/19   Sleep Study - Denies  DM - Denies  Blood Thinner Instructions: Believes he is to stop Eliquis 3-4 days prior to surgery. Him and his wife are aware to call surgeon and Dr. Aundra Dubin for specific instructions. Confirmed that he will stop Eliquis 3 days prior to surgery per wife Zora.  Aspirin Instructions:  ERAS Protcol -Yes  COVID TEST-  03/14/20  Anesthesia review:  Yes cardiac hx A Fib  Patient denies shortness of breath, fever, cough and chest pain at PAT appointment   All instructions explained to the patient, with a verbal understanding of the material. Patient agrees to go over the instructions while at home for a better understanding. Patient also instructed to self quarantine after being tested for COVID-19. The opportunity to ask questions was provided.

## 2020-03-13 NOTE — Progress Notes (Signed)
Anesthesia Chart Review:  Follows with cardiology for history of severe AS status post TAVR with Edwards's Sapien 3 THV x2 (valve in valve due to perivalvular leak after first valve placed) August 2020, HFpEF, paroxysmal A. fib.  He has had multiple instances of presentation to ED with A. fib in RVR, most recently February 2019 and he underwent DCCV back to NSR.   He is anticoagulated on Eliquis.  He is on amiodarone.  He was in NSR when last seen by Dr. Marigene Ehlers 01/22/2020.  TEE 09/19/2019 showed EF 60 to 65%, well-seated valve in valve TAVR with bioprosthetic aortic valve, no significant perivalvular leak, mean gradient 8 mmHg.  Patient was cleared for surgery by Dr. Marigene Ehlers on 12/26/2019 per clearance note on chart, "okay to hold Eliquis 3 days prior to surgery.  Okay for surgery."  Follows with hematology/oncology for recently diagnosed plasma cell myeloma.  Completed treatment with Revlimid, Velcade, and prednisone.  He did develop some persistent neuropathic pain in his lower extremities.  Last seen by Dr.Kale 02/12/2020.  No lab or clinical evidence of multiple myeloma recurrence at that time, no indication restart treatment.  Cleared for hernia surgery from a myeloma standpoint.  He was started on Zometa every 8 weeks.  Preop labs reviewed, elevated WBC 19.9.  Patient has had similar elevations in the past likely related to his multiple myeloma.  Results called to Dr. Pollie Friar office.  Labs otherwise unremarkable.  EKG 01/22/2020: Normal sinus rhythm.  Rate 65. Left anterior fascicular block. Nonspecific T wave abnormality. Prolonged QT (QTC 517). No significant change since last tracing  TEE 09/19/19: 1. Left ventricular ejection fraction, by visual estimation, is 60 to  65%. The left ventricle has normal function. There is moderately increased  left ventricular hypertrophy.  2. The left ventricle has no regional wall motion abnormalities.  3. Global right ventricle has normal systolic  function.The right  ventricular size is normal. No increase in right ventricular wall  thickness.  4. Left atrial size was mildly dilated. No LA appendage thrombus.  5. Right atrial size was normal.  6. No PFO/ASD noted by color doppler.  7. The tricuspid valve is normal in structure.  8. S/p valve-in-valve TAVR with bioprosthetic aortic valve. The valve was  well-seated with no significant peri-valvular leakage noted and mean  gradient 8 mmHg (may be underestimated).  9. The mitral valve is normal in structure. Mild mitral valve  regurgitation. No evidence of mitral stenosis.  10. The pulmonic valve was normal in structure. Pulmonic valve  regurgitation is trivial.  11. Ascending aorta measured 4.5 cm. There was mild plaque in the  descending thoracic aorta.   R/LHC (pre-TAVR) 03/07/2019: 1. No angiographic evidence of CAD 2. Severe aortic stenosis ( mean gradient 49.4 mmHg, peak to peak gradient 78 mmHg).  3. Right heart cath (RA 6, RV 52/3/9, PA58/21/35, no wedge obtained due to inability to wedge the catheter)  Recommendations: Continue with TAVR workup.    Wynonia Musty [facility] Short Stay Center/Anesthesiology Phone [phone] 03/13/2020 11:48 AM

## 2020-03-13 NOTE — Anesthesia Preprocedure Evaluation (Addendum)
Anesthesia Evaluation  Patient identified by MRN, date of birth, ID band Patient awake    Airway Mallampati: II  TM Distance: >3 FB Neck ROM: Full    Dental  (+) Dental Advisory Given   Pulmonary neg pulmonary ROS,    breath sounds clear to auscultation       Cardiovascular hypertension, + CAD and +CHF  + dysrhythmias Atrial Fibrillation  Rhythm:Regular Rate:Normal  S/p TAVR. Mean gradient of 62mHg. Mild MR. Good LVEF   Neuro/Psych negative neurological ROS     GI/Hepatic Neg liver ROS, GERD  ,  Endo/Other  negative endocrine ROS  Renal/GU      Musculoskeletal  (+) Arthritis ,   Abdominal   Peds  Hematology negative hematology ROS (+)   Anesthesia Other Findings   Reproductive/Obstetrics                           Anesthesia Physical Anesthesia Plan  ASA: III  Anesthesia Plan: General   Post-op Pain Management:    Induction: Intravenous  PONV Risk Score and Plan: 2 and Dexamethasone, Ondansetron and Treatment may vary due to age or medical condition  Airway Management Planned: Oral ETT  Additional Equipment: None  Intra-op Plan:   Post-operative Plan: Extubation in OR  Informed Consent: I have reviewed the patients History and Physical, chart, labs and discussed the procedure including the risks, benefits and alternatives for the proposed anesthesia with the patient or authorized representative who has indicated his/her understanding and acceptance.     Dental advisory given  Plan Discussed with: CRNA  Anesthesia Plan Comments: (PAT note by JKaroline Caldwell PA-C: Follows with cardiology for history of severe AS status post TAVR with Edwards's Sapien 3 THV x2 (valve in valve due to perivalvular leak after first valve placed) August 2020, HFpEF, paroxysmal A. fib.  He has had multiple instances of presentation to ED with A. fib in RVR, most recently February 2019 and he underwent  DCCV back to NSR.   He is anticoagulated on Eliquis.  He is on amiodarone.  He was in NSR when last seen by Dr. MMarigene Ehlers5/07/2020.  TEE 09/19/2019 showed EF 60 to 65%, well-seated valve in valve TAVR with bioprosthetic aortic valve, no significant perivalvular leak, mean gradient 8 mmHg.  Patient was cleared for surgery by Dr. MMarigene Ehlerson 12/26/2019 per clearance note on chart, "okay to hold Eliquis 3 days prior to surgery.  Okay for surgery."  Follows with hematology/oncology for recently diagnosed plasma cell myeloma.  Completed treatment with Revlimid, Velcade, and prednisone.  He did develop some persistent neuropathic pain in his lower extremities.  Last seen by Dr.Kale 02/12/2020.  No lab or clinical evidence of multiple myeloma recurrence at that time, no indication restart treatment.  Cleared for hernia surgery from a myeloma standpoint.  He was started on Zometa every 8 weeks.  Preop labs reviewed, elevated WBC 19.9.  Patient has had similar elevations in the past likely related to his multiple myeloma.  Results called to Dr. NPollie Friaroffice.  Labs otherwise unremarkable.  EKG 01/22/2020: Normal sinus rhythm.  Rate 65. Left anterior fascicular block. Nonspecific T wave abnormality. Prolonged QT (QTC 517). No significant change since last tracing  TEE 09/19/19: 1. Left ventricular ejection fraction, by visual estimation, is 60 to  65%. The left ventricle has normal function. There is moderately increased  left ventricular hypertrophy.  2. The left ventricle has no regional wall motion abnormalities.  3. Global  right ventricle has normal systolic function.The right  ventricular size is normal. No increase in right ventricular wall  thickness.  4. Left atrial size was mildly dilated. No LA appendage thrombus.  5. Right atrial size was normal.  6. No PFO/ASD noted by color doppler.  7. The tricuspid valve is normal in structure.  8. S/p valve-in-valve TAVR with bioprosthetic aortic valve.  The valve was  well-seated with no significant peri-valvular leakage noted and mean  gradient 8 mmHg (may be underestimated).  9. The mitral valve is normal in structure. Mild mitral valve  regurgitation. No evidence of mitral stenosis.  10. The pulmonic valve was normal in structure. Pulmonic valve  regurgitation is trivial.  11. Ascending aorta measured 4.5 cm. There was mild plaque in the  descending thoracic aorta.   R/LHC (pre-TAVR) 03/07/2019: 1. No angiographic evidence of CAD 2. Severe aortic stenosis ( mean gradient 49.4 mmHg, peak to peak gradient 78 mmHg).  3. Right heart cath (RA 6, RV 52/3/9, PA58/21/35, no wedge obtained due to inability to wedge the catheter) )       Anesthesia Quick Evaluation

## 2020-03-14 ENCOUNTER — Other Ambulatory Visit (HOSPITAL_COMMUNITY)
Admission: RE | Admit: 2020-03-14 | Discharge: 2020-03-14 | Disposition: A | Discharge status: Home / Self Care | Payer: Medicare Other | Source: Ambulatory Visit | Attending: Surgery | Admitting: Surgery

## 2020-03-14 DIAGNOSIS — Z20822 Contact with and (suspected) exposure to covid-19: Secondary | ICD-10-CM | POA: Diagnosis not present

## 2020-03-14 DIAGNOSIS — Z01812 Encounter for preprocedural laboratory examination: Secondary | ICD-10-CM | POA: Insufficient documentation

## 2020-03-14 LAB — SARS CORONAVIRUS 2 (TAT 6-24 HRS): SARS Coronavirus 2: NEGATIVE

## 2020-03-14 MED ORDER — BUPIVACAINE LIPOSOME 1.3 % IJ SUSP
20.0000 mL | Freq: Once | INTRAMUSCULAR | Status: AC
  Administered 2020-03-18: 20 mL
  Filled 2020-03-14: qty 20

## 2020-03-17 NOTE — H&P (Signed)
[name] [name]  Location: [facility] Patient #: [id number] DOB: 1941-02-09 Married / Language: English / Race: White Male  History of Present Illness   The patient is a 79 year old male who presents with a complaint of inguinal hernia.  The PCP is Dr. Loni Muse [name].  The patient was referred by Dr. Billey Gosling His wife, [name] [name], is with him.  He has noticed a left inguinal hernia for 20 years. However over the last several months his call some increasing discomfort. He underwent a valve replacement last year in August 2020. This apparently had to be redone. He was found to have multiple myeloma - and underwent treatment for this. Then he was hospitalized for a GI bug in January. (09/13/2019 - 09/21/2019 and 09/30/2019 - 10/02/2019) His diagnosis was severe sepsis secondary to ischemic colitis. Between all these events, his wife said that he has lost 40 pounts. He looks frail, moves slowly, and is using a cane.  I discussed the indications and complications of hernia surgery with the patient. I discussed both the laparoscopic and open approach to hernia repair.. The potential risks of hernia surgery include, but are not limited to, bleeding, infection, open surgery, nerve injury, and recurrence of the hernia. I provided the patient literature about hernia surgery. We talked about the use of mesh in hernias and their risks.  The risk of mesh include chronic infection, erosion to other structures, and chronic pain.  Plan: 1. Cardiac clearance and suggestions about stopping Eliquis, 2. Open repair of left inguinal hernia - with his weakness, consider overnight stay  Review of Systems as stated in this history (HPI) or in the review of systems. Otherwise all other 12 point ROS are negative  Past Medical History: 1. Plasma Cell myeloma - says that he is disease free at this time Dr. 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 He had a transcatheter aortic valve replacement - 05/08/2019 - Drs. McAlhany and Bartle 3. Atrial fibrillation Dr. Einar Crow 4. Lumbar laminectomy - 1980 5. Anticoagulated - Eliquis ? 6. Colonoscopy at [facility] in 2019 to remove large polyp 7. Had second Covid vaccine recently 8. Hospitalized Jan 2021 for ischemic colitis slowly getting over this  Social History: His wife, [name] [name], is with him. She works at Dr[employer] office They have no children  The patient's family history was non contributory.  Allergies [name] [name], CMA; 12/13/2019 9:16 AM) No Known Drug Allergies  [02/10/2018]: Allergies Reconciled   Medication History [name] [name], CMA; 12/13/2019 9:17 AM) Acyclovir (400MG Tablet, Oral) Active. Spironolactone (25MG Tablet, Oral) Active. Loratadine (10MG Tablet, Oral) Active. Neomycin Sulfate (500MG Tablet, 2 (two) Oral SEE NOTE, Taken starting 02/13/2018) Active. (TAKE TWO TABLETS AT 2 PM, 3 PM, AND 10 PM THE DAY PRIOR TO SURGERY) Flagyl (500MG Tablet, 2 (two) Oral SEE NOTE, Taken starting 02/13/2018) Active. (Take at 2pm, 3pm, and 10pm the day prior to your colon operation) Eliquis (5MG Tablet, Oral) Active. HydroCHLOROthiazide (12.5MG Capsule, Oral) Active. Klor-Con M20 [facility] Tablet ER, Oral) Active. Metoprolol Succinate ER (50MG Tablet ER 24HR, Oral) Active. HydroCHLOROthiazide (25MG Tablet, Oral) Active. Medications Reconciled  Vitals [name] [name] CMA; 12/13/2019 9:18 AM) 12/13/2019 9:17 AM Weight: 158.8 lb Height: 70in Body Surface Area: 1.89 m Body Mass Index: 22.79 kg/m  Temp.: 97.52F (Tympanic)  Pulse: 61 (Regular)  BP: 128/72(Sitting, Left Arm, Standard)   Physical Exam  General: Frail older WM who is alert. He is very thin. Uses a cane to  walk. He is frail. HEENT: Normal. Pupils equal.  Neck: Supple. No mass. No thyroid mass.  Lymph Nodes: No supraclavicular or cervical  nodes.  Lungs: Clear to auscultation and symmetric breath sounds. Heart: RRR. Has harsh 4/6 systolic murmur  Abdomen: Soft. No mass. No tenderness. Normal bowel sounds. No abdominal scars. He is very thin, but has a medium sized left inguinal hernia. The hernia is reducible. Rectal: Not done.  Extremities: Okay strength and ROM in upper and lower extremities. He is weak, has some neuropathy and moves slowly.  Neurologic: complains of neuropathy in his feet Psychiatric: Has normal mood and affect. Behavior is normal.   Assessment & Plan  1.  INGUINAL HERNIA OF LEFT SIDE WITHOUT OBSTRUCTION OR GANGRENE (K40.90)  Plan:  1. Cardiac clearance and suggestions about stopping Eliquis   2. Open repair of left inguinal hernia - with his weakness, consider overnight stay  2.  ANTICOAGULATED (Z79.01)  Impression: on eliquis 3.  FRAILTY (R54) 4.  HISTORY OF MULTIPLE MYELOMA (Z85.79)  Plasma Cell myeloma - says that he is disease free at this time  Dr. 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 He had a transcatheter aortic valve replacement - 05/08/2019 - Drs. McAlhany and Bartle 6. Atrial fibrillation Dr. Einar Crow 7. Anticoagulated -   Eliquis  8. Had second Covid vaccine recently 9. Hospitalized Jan 2021 for ischemic colitis slowly getting over this 10.  Left proximal humeral fracture  ORIF - 01/15/2020 - Rhona Raider, MD, [facility] Surgery Office phone:  [phone]

## 2020-03-18 ENCOUNTER — Encounter (HOSPITAL_COMMUNITY): Payer: Self-pay | Admitting: Surgery

## 2020-03-18 ENCOUNTER — Observation Stay (HOSPITAL_COMMUNITY)
Admission: RE | Admit: 2020-03-18 | Discharge: 2020-03-19 | Disposition: A | Discharge status: Home / Self Care | Payer: Medicare Other | Attending: Surgery | Admitting: Surgery

## 2020-03-18 ENCOUNTER — Ambulatory Visit (HOSPITAL_COMMUNITY): Payer: Medicare Other | Admitting: Anesthesiology

## 2020-03-18 ENCOUNTER — Encounter (HOSPITAL_COMMUNITY)
Admission: RE | Disposition: A | Discharge status: Home / Self Care | Payer: Self-pay | Source: Home / Self Care | Attending: Surgery

## 2020-03-18 ENCOUNTER — Other Ambulatory Visit: Payer: Self-pay

## 2020-03-18 ENCOUNTER — Ambulatory Visit (HOSPITAL_COMMUNITY): Payer: Medicare Other | Admitting: Physician Assistant

## 2020-03-18 DIAGNOSIS — G5793 Unspecified mononeuropathy of bilateral lower limbs: Secondary | ICD-10-CM | POA: Diagnosis not present

## 2020-03-18 DIAGNOSIS — Z8579 Personal history of other malignant neoplasms of lymphoid, hematopoietic and related tissues: Secondary | ICD-10-CM | POA: Insufficient documentation

## 2020-03-18 DIAGNOSIS — K4090 Unilateral inguinal hernia, without obstruction or gangrene, not specified as recurrent: Principal | ICD-10-CM | POA: Insufficient documentation

## 2020-03-18 DIAGNOSIS — Z7901 Long term (current) use of anticoagulants: Secondary | ICD-10-CM | POA: Insufficient documentation

## 2020-03-18 DIAGNOSIS — Z952 Presence of prosthetic heart valve: Secondary | ICD-10-CM | POA: Diagnosis not present

## 2020-03-18 DIAGNOSIS — I4891 Unspecified atrial fibrillation: Secondary | ICD-10-CM | POA: Diagnosis not present

## 2020-03-18 DIAGNOSIS — Z79899 Other long term (current) drug therapy: Secondary | ICD-10-CM | POA: Insufficient documentation

## 2020-03-18 HISTORY — PX: INGUINAL HERNIA REPAIR: SHX194

## 2020-03-18 SURGERY — REPAIR, HERNIA, INGUINAL, ADULT
Anesthesia: General | Site: Abdomen | Laterality: Left

## 2020-03-18 MED ORDER — ONDANSETRON HCL 4 MG/2ML IJ SOLN
INTRAMUSCULAR | Status: AC
  Filled 2020-03-18: qty 2

## 2020-03-18 MED ORDER — GABAPENTIN 300 MG PO CAPS
300.0000 mg | ORAL_CAPSULE | Freq: Three times a day (TID) | ORAL | Status: DC
  Administered 2020-03-18: 300 mg via ORAL
  Filled 2020-03-18: qty 1

## 2020-03-18 MED ORDER — ONDANSETRON HCL 4 MG/2ML IJ SOLN: 4.0000 mg | Freq: Four times a day (QID) | INTRAMUSCULAR | Status: DC | PRN

## 2020-03-18 MED ORDER — PROPOFOL 10 MG/ML IV BOLUS
INTRAVENOUS | Status: DC | PRN
  Administered 2020-03-18: 50 mg via INTRAVENOUS
  Administered 2020-03-18: 80 mg via INTRAVENOUS
  Administered 2020-03-18: 70 mg via INTRAVENOUS

## 2020-03-18 MED ORDER — BUPIVACAINE HCL (PF) 0.25 % IJ SOLN
INTRAMUSCULAR | Status: AC
  Filled 2020-03-18: qty 30

## 2020-03-18 MED ORDER — CHLORHEXIDINE GLUCONATE 0.12 % MT SOLN
15.0000 mL | Freq: Once | OROMUCOSAL | Status: AC
  Administered 2020-03-18: 15 mL via OROMUCOSAL

## 2020-03-18 MED ORDER — TRAMADOL HCL 50 MG PO TABS: 50.0000 mg | ORAL_TABLET | Freq: Four times a day (QID) | ORAL | Status: DC | PRN

## 2020-03-18 MED ORDER — PHENYLEPHRINE 40 MCG/ML (10ML) SYRINGE FOR IV PUSH (FOR BLOOD PRESSURE SUPPORT)
PREFILLED_SYRINGE | INTRAVENOUS | Status: AC
  Filled 2020-03-18: qty 10

## 2020-03-18 MED ORDER — ACETAMINOPHEN 500 MG PO TABS
1000.0000 mg | ORAL_TABLET | ORAL | Status: AC
  Administered 2020-03-18: 1000 mg via ORAL
  Filled 2020-03-18: qty 2

## 2020-03-18 MED ORDER — METOPROLOL SUCCINATE ER 25 MG PO TB24
25.0000 mg | ORAL_TABLET | Freq: Two times a day (BID) | ORAL | Status: DC
  Filled 2020-03-18: qty 1

## 2020-03-18 MED ORDER — HEPARIN SODIUM (PORCINE) 5000 UNIT/ML IJ SOLN
5000.0000 [IU] | Freq: Three times a day (TID) | INTRAMUSCULAR | Status: DC
  Administered 2020-03-19: 5000 [IU] via SUBCUTANEOUS
  Filled 2020-03-18: qty 1

## 2020-03-18 MED ORDER — CHLORHEXIDINE GLUCONATE 4 % EX LIQD: 60.0000 mL | Freq: Once | CUTANEOUS | Status: DC

## 2020-03-18 MED ORDER — BUPIVACAINE HCL (PF) 0.25 % IJ SOLN
INTRAMUSCULAR | Status: DC | PRN
  Administered 2020-03-18: 20 mL

## 2020-03-18 MED ORDER — LORAZEPAM 0.5 MG PO TABS
0.5000 mg | ORAL_TABLET | Freq: Every day | ORAL | Status: DC
  Administered 2020-03-18: 0.5 mg via ORAL
  Filled 2020-03-18: qty 1

## 2020-03-18 MED ORDER — FENTANYL CITRATE (PF) 100 MCG/2ML IJ SOLN
INTRAMUSCULAR | Status: DC | PRN
  Administered 2020-03-18: 50 ug via INTRAVENOUS

## 2020-03-18 MED ORDER — ONDANSETRON 4 MG PO TBDP: 4.0000 mg | ORAL_TABLET | Freq: Four times a day (QID) | ORAL | Status: DC | PRN

## 2020-03-18 MED ORDER — MORPHINE SULFATE (PF) 2 MG/ML IV SOLN: 1.0000 mg | INTRAVENOUS | Status: DC | PRN

## 2020-03-18 MED ORDER — PHENYLEPHRINE HCL-NACL 10-0.9 MG/250ML-% IV SOLN
INTRAVENOUS | Status: DC | PRN
  Administered 2020-03-18: 20 ug/min via INTRAVENOUS

## 2020-03-18 MED ORDER — DEXAMETHASONE SODIUM PHOSPHATE 10 MG/ML IJ SOLN
INTRAMUSCULAR | Status: AC
  Filled 2020-03-18: qty 1

## 2020-03-18 MED ORDER — AMIODARONE HCL 200 MG PO TABS: 200.0000 mg | ORAL_TABLET | Freq: Every day | ORAL | Status: DC

## 2020-03-18 MED ORDER — ORAL CARE MOUTH RINSE: 15.0000 mL | Freq: Once | OROMUCOSAL | Status: AC

## 2020-03-18 MED ORDER — OXYCODONE HCL 5 MG PO TABS: 5.0000 mg | ORAL_TABLET | ORAL | Status: DC | PRN

## 2020-03-18 MED ORDER — DULOXETINE HCL 60 MG PO CPEP
60.0000 mg | ORAL_CAPSULE | Freq: Every day | ORAL | Status: DC
  Administered 2020-03-18: 60 mg via ORAL
  Filled 2020-03-18: qty 1

## 2020-03-18 MED ORDER — CHLORHEXIDINE GLUCONATE 0.12 % MT SOLN
OROMUCOSAL | Status: AC
  Filled 2020-03-18: qty 15

## 2020-03-18 MED ORDER — CEFAZOLIN SODIUM-DEXTROSE 2-4 GM/100ML-% IV SOLN
2.0000 g | INTRAVENOUS | Status: AC
  Administered 2020-03-18: 2 g via INTRAVENOUS
  Filled 2020-03-18: qty 100

## 2020-03-18 MED ORDER — FENTANYL CITRATE (PF) 100 MCG/2ML IJ SOLN: 25.0000 ug | INTRAMUSCULAR | Status: DC | PRN

## 2020-03-18 MED ORDER — EPHEDRINE 5 MG/ML INJ
INTRAVENOUS | Status: AC
  Filled 2020-03-18: qty 10

## 2020-03-18 MED ORDER — KCL IN DEXTROSE-NACL 20-5-0.45 MEQ/L-%-% IV SOLN: INTRAVENOUS | Status: DC

## 2020-03-18 MED ORDER — PROPOFOL 10 MG/ML IV BOLUS
INTRAVENOUS | Status: AC
  Filled 2020-03-18: qty 20

## 2020-03-18 MED ORDER — 0.9 % SODIUM CHLORIDE (POUR BTL) OPTIME
TOPICAL | Status: DC | PRN
  Administered 2020-03-18: 1000 mL

## 2020-03-18 MED ORDER — PHENYLEPHRINE 40 MCG/ML (10ML) SYRINGE FOR IV PUSH (FOR BLOOD PRESSURE SUPPORT)
PREFILLED_SYRINGE | INTRAVENOUS | Status: DC | PRN
  Administered 2020-03-18: 120 ug via INTRAVENOUS
  Administered 2020-03-18 (×2): 80 ug via INTRAVENOUS
  Administered 2020-03-18: 120 ug via INTRAVENOUS

## 2020-03-18 MED ORDER — LACTATED RINGERS IV SOLN: INTRAVENOUS | Status: DC

## 2020-03-18 MED ORDER — ONDANSETRON HCL 4 MG/2ML IJ SOLN
INTRAMUSCULAR | Status: DC | PRN
  Administered 2020-03-18: 4 mg via INTRAVENOUS

## 2020-03-18 MED ORDER — FENTANYL CITRATE (PF) 250 MCG/5ML IJ SOLN
INTRAMUSCULAR | Status: AC
  Filled 2020-03-18: qty 5

## 2020-03-18 MED ORDER — LIDOCAINE 2% (20 MG/ML) 5 ML SYRINGE
INTRAMUSCULAR | Status: DC | PRN
  Administered 2020-03-18: 40 mg via INTRAVENOUS

## 2020-03-18 MED ORDER — EPHEDRINE SULFATE-NACL 50-0.9 MG/10ML-% IV SOSY
PREFILLED_SYRINGE | INTRAVENOUS | Status: DC | PRN
  Administered 2020-03-18: 7.5 mg via INTRAVENOUS
  Administered 2020-03-18: 5 mg via INTRAVENOUS

## 2020-03-18 MED ORDER — AMLODIPINE BESYLATE 5 MG PO TABS: 5.0000 mg | ORAL_TABLET | Freq: Every day | ORAL | Status: DC

## 2020-03-18 MED ORDER — STERILE WATER FOR IRRIGATION IR SOLN
Status: DC | PRN
  Administered 2020-03-18: 1000 mL

## 2020-03-18 SURGICAL SUPPLY — 41 items
ADH SKN CLS APL DERMABOND .7 (GAUZE/BANDAGES/DRESSINGS) ×1
APL PRP STRL LF DISP 70% ISPRP (MISCELLANEOUS) ×1
BLADE CLIPPER SURG (BLADE) IMPLANT
CHLORAPREP W/TINT 26 (MISCELLANEOUS) ×2 IMPLANT
COVER SURGICAL LIGHT HANDLE (MISCELLANEOUS) ×2 IMPLANT
COVER WAND RF STERILE (DRAPES) ×2 IMPLANT
DECANTER SPIKE VIAL GLASS SM (MISCELLANEOUS) ×2 IMPLANT
DERMABOND ADVANCED (GAUZE/BANDAGES/DRESSINGS) ×1
DERMABOND ADVANCED .7 DNX12 (GAUZE/BANDAGES/DRESSINGS) ×1 IMPLANT
DRAIN PENROSE 1/2X12 LTX STRL (WOUND CARE) IMPLANT
DRAPE LAPAROTOMY TRNSV 102X78 (DRAPES) ×2 IMPLANT
ELECT CAUTERY BLADE 6.4 (BLADE) ×2 IMPLANT
ELECT REM PT RETURN 9FT ADLT (ELECTROSURGICAL) ×2
ELECTRODE REM PT RTRN 9FT ADLT (ELECTROSURGICAL) ×1 IMPLANT
GLOVE SURG SYN 7.5  E (GLOVE) ×4
GLOVE SURG SYN 7.5 E (GLOVE) ×2 IMPLANT
GLOVE SURG SYN 7.5 PF PI (GLOVE) ×1 IMPLANT
GOWN STRL REUS W/ TWL LRG LVL3 (GOWN DISPOSABLE) ×1 IMPLANT
GOWN STRL REUS W/ TWL XL LVL3 (GOWN DISPOSABLE) ×1 IMPLANT
GOWN STRL REUS W/TWL LRG LVL3 (GOWN DISPOSABLE) ×2
GOWN STRL REUS W/TWL XL LVL3 (GOWN DISPOSABLE) ×2
KIT BASIN OR (CUSTOM PROCEDURE TRAY) ×2 IMPLANT
KIT TURNOVER KIT B (KITS) ×2 IMPLANT
MESH ULTRAPRO 3X6 7.6X15CM (Mesh General) ×1 IMPLANT
NDL HYPO 25GX1X1/2 BEV (NEEDLE) ×1 IMPLANT
NEEDLE HYPO 25GX1X1/2 BEV (NEEDLE) ×2 IMPLANT
NS IRRIG 1000ML POUR BTL (IV SOLUTION) ×2 IMPLANT
PACK GENERAL/GYN (CUSTOM PROCEDURE TRAY) ×2 IMPLANT
PAD ARMBOARD 7.5X6 YLW CONV (MISCELLANEOUS) ×2 IMPLANT
PENCIL SMOKE EVACUATOR (MISCELLANEOUS) ×2 IMPLANT
SPECIMEN JAR SMALL (MISCELLANEOUS) IMPLANT
SPONGE INTESTINAL PEANUT (DISPOSABLE) ×2 IMPLANT
SUT MNCRL AB 4-0 PS2 18 (SUTURE) ×2 IMPLANT
SUT NOVA 0 T19/GS 22DT (SUTURE) ×3 IMPLANT
SUT VIC AB 2-0 SH 27 (SUTURE) ×2
SUT VIC AB 2-0 SH 27X BRD (SUTURE) ×1 IMPLANT
SUT VIC AB 3-0 SH 18 (SUTURE) ×2 IMPLANT
SUT VICRYL AB 2 0 TIES (SUTURE) ×2 IMPLANT
SYR CONTROL 10ML LL (SYRINGE) ×2 IMPLANT
TOWEL GREEN STERILE (TOWEL DISPOSABLE) ×2 IMPLANT
TOWEL GREEN STERILE FF (TOWEL DISPOSABLE) ×2 IMPLANT

## 2020-03-18 NOTE — Interval H&P Note (Signed)
History and Physical Interval Note:  03/18/2020 2:28 PM  [name] [name]  has presented today for surgery, with the diagnosis of [place].  The various methods of treatment have been discussed with the patient and family.   His wife is here with him.  After consideration of risks, benefits and other options for treatment, the patient has consented to  Procedure(s): OPEN LEFT INGUINAL HERNIA REPAIR (Left) as a surgical intervention.  The patient's history has been reviewed, patient examined, no change in status, stable for surgery.  I have reviewed the patient's chart and labs.  Questions were answered to the patient's satisfaction.     Shann Medal

## 2020-03-18 NOTE — Op Note (Signed)
03/18/2020  4:10 PM  PATIENT:  [name] [name], 79 y.o., male, MRN: [id number]  PREOP DIAGNOSIS:  LEFT INGUINAL HERNIA REPAIR  POSTOP DIAGNOSIS:   Direct left inguinal hernia  PROCEDURE:   Procedure(s):  OPEN LEFT INGUINAL HERNIA REPAIR  SURGEON:   Alphonsa Overall, M.D.  ANESTHESIA:   general  Anesthesiologist: Freddrick March, MD CRNA: Jenne Campus, CRNA  General  EBL:  minimal  ml  LOCAL MEDICATIONS USED:   20 cc of Exparel and 20 cc of 1/4% marcaine  SPECIMEN:   none  COUNTS CORRECT:  YES  INDICATIONS FOR PROCEDURE:  [name] [name] is a 79 y.o. (DOB: 08-May-1941) white male whose primary care physician is [name], [name], MD and comes for open repair of left inguinal hernia.   The indications and risks of the hernia surgery were explained to the patient.  The risks include, but are not limited to, infection, bleeding, recurrence of the hernia, and nerve injury.  Operative Note: The patient was taken to room number 2 at [facility].  He underwent a general anesthesia.    A time out was held and the surgical checklist run.  His lower abdomen was shaved and then prepped with chloroprep.  A left inguinal incision was made through the subcutaneous fat to the external oblique fascia.  The external ring was opened and the cord structures encircled with a penrose drain.  The patient had an indirect left inguinal hernia.  I identified the ileo inguinal nerve and preserved this during the dissection.  The patient had a hernia that protruded through the entire inguinal floor.  His entire inguinal floor was lax.  The hernia protruded about 4 to 5 cm.  I reduced the hernia and imbricated it with a 2-0 vicryl suture.  The inguinal floor was repaired with a 3 x 6 inch piece of Ultrapro mesh.  The mesh was cut to fit the inguinal floor.  The mesh was sewn in place with interrupted 0 Novafil suture.  I sewed the mesh medially to the pubic tubercle, inferiorly to the shelving edge of the  inguinal ligament, and superiorly to the transversalis fascia.   A key hole was made for the internal ring and the mesh sewn on itself behind the keyhole.  The mesh lay flat.  The inguinal floor was covered and the internal ring recreated.  The cord structures were returned to a normal location.  The external oblique was closed with a 3-0 vicryl.  The fascia and subcutaneous tissues were infiltrated with 1/4% marcaine plain.  The skin was closed with 4-0 monocryl and painted with DermaBond. The sponge and needle count were correct at the end of the case.  The patient was transported to the recovery room in good condition.  The patient will go home today.  Discharge instructions reviewed.  Alphonsa Overall, MD, [facility] Surgery Pager: [phone] Office phone:  [phone]

## 2020-03-18 NOTE — Anesthesia Procedure Notes (Signed)
Procedure Name: LMA Insertion Date/Time: 03/18/2020 2:53 PM Performed by: Jenne Campus, CRNA Pre-anesthesia Checklist: Patient identified, Emergency Drugs available, Suction available and Patient being monitored Patient Re-evaluated:Patient Re-evaluated prior to induction Oxygen Delivery Method: Circle System Utilized Preoxygenation: Pre-oxygenation with 100% oxygen Induction Type: IV induction Ventilation: Mask ventilation without difficulty LMA: LMA inserted LMA Size: 4.0 Number of attempts: 1 Airway Equipment and Method: Bite block Placement Confirmation: positive ETCO2 and breath sounds checked- equal and bilateral Tube secured with: Tape Dental Injury: Teeth and Oropharynx as per pre-operative assessment

## 2020-03-18 NOTE — Transfer of Care (Signed)
Immediate Anesthesia Transfer of Care Note  Patient: [name] [name]  Procedure(s) Performed: OPEN LEFT INGUINAL HERNIA REPAIR (Left Abdomen)  Patient Location: PACU  Anesthesia Type:General  Level of Consciousness: sedated and patient cooperative  Airway & Oxygen Therapy: Patient Spontanous Breathing and Patient connected to nasal cannula oxygen  Post-op Assessment: Report given to RN and Post -op Vital signs reviewed and stable  Post vital signs: Reviewed  Last Vitals:  Vitals Value Taken Time  BP 118/56 03/18/20 1620  Temp    Pulse 58 03/18/20 1621  Resp 15 03/18/20 1621  SpO2 100 % 03/18/20 1621  Vitals shown include unvalidated device data.  Last Pain:  Vitals:   03/18/20 1236  TempSrc:   PainSc: 0-No pain         Complications: No complications documented.

## 2020-03-19 ENCOUNTER — Telehealth: Payer: Self-pay | Admitting: *Deleted

## 2020-03-19 ENCOUNTER — Encounter (HOSPITAL_COMMUNITY): Payer: Self-pay | Admitting: Surgery

## 2020-03-19 DIAGNOSIS — K4090 Unilateral inguinal hernia, without obstruction or gangrene, not specified as recurrent: Secondary | ICD-10-CM | POA: Diagnosis not present

## 2020-03-19 DIAGNOSIS — Z8579 Personal history of other malignant neoplasms of lymphoid, hematopoietic and related tissues: Secondary | ICD-10-CM | POA: Diagnosis not present

## 2020-03-19 DIAGNOSIS — Z79899 Other long term (current) drug therapy: Secondary | ICD-10-CM | POA: Diagnosis not present

## 2020-03-19 DIAGNOSIS — I4891 Unspecified atrial fibrillation: Secondary | ICD-10-CM | POA: Diagnosis not present

## 2020-03-19 DIAGNOSIS — G5793 Unspecified mononeuropathy of bilateral lower limbs: Secondary | ICD-10-CM | POA: Diagnosis not present

## 2020-03-19 DIAGNOSIS — Z7901 Long term (current) use of anticoagulants: Secondary | ICD-10-CM | POA: Diagnosis not present

## 2020-03-19 MED ORDER — HYDROCODONE-ACETAMINOPHEN 5-325 MG PO TABS: 1.0000 | ORAL_TABLET | Freq: Four times a day (QID) | ORAL | 0 refills | Status: DC | PRN

## 2020-03-19 NOTE — Plan of Care (Signed)

## 2020-03-19 NOTE — Progress Notes (Signed)
Saunders Rosenkranz to be D/C'd  per MD order. Discussed with the patient and all questions fully answered.  VSS, Skin clean, dry and intact without evidence of skin break down, no evidence of skin tears noted.  IV catheter discontinued intact. Site without signs and symptoms of complications. Dressing and pressure applied.  An After Visit Summary was printed and given to the patient. Patient received prescription.  D/c education completed with patient/family including follow up instructions, medication list, d/c activities limitations if indicated, with other d/c instructions as indicated by MD - patient able to verbalize understanding, all questions fully answered.   Patient instructed to return to ED, call 911, or call MD for any changes in condition.   Patient to be escorted via [place], and D/C home via private auto.

## 2020-03-19 NOTE — Plan of Care (Signed)
  Problem: Education: Goal: Required Educational Video(s) Outcome: Completed/Met   Problem: Clinical Measurements: Goal: Postoperative complications will be avoided or minimized Outcome: Completed/Met   Problem: Skin Integrity: Goal: Demonstration of wound healing without infection will improve Outcome: Completed/Met   Problem: Education: Goal: Knowledge of General Education information will improve Description: Including pain rating scale, medication(s)/side effects and non-pharmacologic comfort measures Outcome: Completed/Met   Problem: Health Behavior/Discharge Planning: Goal: Ability to manage health-related needs will improve Outcome: Completed/Met   Problem: Clinical Measurements: Goal: Ability to maintain clinical measurements within normal limits will improve Outcome: Completed/Met Goal: Will remain free from infection Outcome: Completed/Met Goal: Diagnostic test results will improve Outcome: Completed/Met Goal: Respiratory complications will improve Outcome: Completed/Met Goal: Cardiovascular complication will be avoided Outcome: Completed/Met   Problem: Activity: Goal: Risk for activity intolerance will decrease Outcome: Completed/Met   Problem: Nutrition: Goal: Adequate nutrition will be maintained Outcome: Completed/Met   Problem: Coping: Goal: Level of anxiety will decrease Outcome: Completed/Met   Problem: Elimination: Goal: Will not experience complications related to bowel motility Outcome: Completed/Met Goal: Will not experience complications related to urinary retention Outcome: Completed/Met   Problem: Pain Managment: Goal: General experience of comfort will improve Outcome: Completed/Met   Problem: Safety: Goal: Ability to remain free from injury will improve Outcome: Completed/Met   Problem: Skin Integrity: Goal: Risk for impaired skin integrity will decrease Outcome: Completed/Met

## 2020-03-19 NOTE — Telephone Encounter (Signed)
Pt was on TCM report admitted 03/18/20 for Left inguinal hernia. Pt underwent a OPEN LEFT INGUINAL HERNIA REPAIR w/Dr. Lucia Gaskins. Pt tolerated procedure well and D/C 03/19/20. Pt will follow-up w/Dr. Lucia Gaskins in 2 weeks.Marland KitchenJohny Chess

## 2020-03-19 NOTE — Discharge Instructions (Signed)
[facility] - DISCHARGE INSTRUCTIONS TO PATIENT  Activity:  Lifting - No lifting more than 15 pounds for 4 weeks.                       Practice your Covid-19 protection:  Wear a mask, social distance, and wash your hands frequently  Wound Care:   Leave the incision dry for 2 days, then you may shower  Diet:  As tolerated  Follow up appointment:  Call Dr. Pollie Friar office [facility]) at [phone] for an appointment in 2 to 3 weeks.  Medications and dosages:  Resume your home medications.  You have a prescription for:  Vicodin             You may also take Tylenol, ibuprofen, or Aleve for pain             You may restart Eliquis this afternoon.  Call Dr. Lucia Gaskins or his office  [phone]) if you have:  Temperature greater than 100.4,  Persistent nausea and vomiting,  Severe uncontrolled pain,  Redness, tenderness, or signs of infection (pain, swelling, redness, odor or green/yellow discharge around the site),  Difficulty breathing, headache or visual disturbances,  Any other questions or concerns you may have after discharge.  In an emergency, call 911 or go to an Emergency Department at a nearby hospital.

## 2020-03-19 NOTE — Discharge Summary (Signed)
Physician Discharge Summary  Patient ID:  [name] [name]  MRN: [id number]  DOB/AGE: 1940/10/07 79 y.o.  Admit date: 03/18/2020 Discharge date: 03/19/2020  Discharge Diagnoses:  1.  Left inguinal hernia 2.  ANTICOAGULATED (Z79.01)             Impression: on eliquis 3.  FRAILTY (R54) 4.  HISTORY OF MULTIPLE MYELOMA (Z85.79)             Plasma Cell myeloma - says that he is disease free at this time        Dr. 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 He had a transcatheter aortic valve replacement - 05/08/2019 - Drs. McAlhany and Bartle 6. Atrial fibrillation Dr. Einar Crow 7. Hospitalized Jan 2021 for ischemic colitis slowly getting over this 8.  Left proximal humeral fracture             [facility] - 01/15/2020 - [facility]   Active Problems:   Left inguinal hernia  Operation: Procedure(s):  OPEN LEFT INGUINAL HERNIA REPAIR on 03/18/2020 Lucia Gaskins  Discharged Condition: good  Hospital Course: Rodarius Kichline is an 79 y.o. male whose primary care physician is [name], [name], MD and who was admitted 03/18/2020 with a chief complaint of left inguinal hernia.   He was brought to the operating room on 03/18/2020 and underwent OPEN LEFT INGUINAL HERNIA REPAIR.  Because of his multiple medical problems, I kept him overnight. His wife is at his bedside.  He had a good night.  The discharge instructions were reviewed with the patient.  Consults: None  Significant Diagnostic Studies: Results for orders placed or performed during the hospital encounter of 03/14/20  SARS CORONAVIRUS 2 (TAT 6-24 HRS) Nasopharyngeal Nasopharyngeal Swab   Specimen: Nasopharyngeal Swab  Result Value Ref Range   SARS Coronavirus 2 NEGATIVE NEGATIVE    No results found.  Discharge Exam:  Vitals:   03/19/20 0205 03/19/20 0411  BP: 102/63 108/68  Pulse: (!) 58 60  Resp:  16  Temp: 98.3 F (36.8 C) 98.6 F (37 C)  SpO2: 99% 98%    General: WN older WM who  is alert and generally healthy appearing.  Lungs: Clear to auscultation and symmetric breath sounds. Abdomen: Soft. No mass.  Normal bowel sounds.   Incision looks good.  Discharge Medications:   Allergies as of 03/19/2020      Reactions   Xarelto [rivaroxaban] Other (See Comments), Hypertension   INCREASED BP-HYPERTENSIVE EVENTS   Ambien [zolpidem]    Hallucinations    Corticosteroids Other (See Comments)   Made the patient  "sick," feel "weird," and his "body rejected" them   Ramipril Other (See Comments)   Could not eat or sleep, lost muscle mass   Benazepril Cough      Medication List    TAKE these medications   acyclovir 400 MG tablet Commonly known as: ZOVIRAX Take 1 tablet (400 mg total) by mouth 2 (two) times daily.   amiodarone 200 MG tablet Commonly known as: PACERONE TAKE 1 TABLET BY MOUTH EVERY DAY   amLODipine 5 MG tablet Commonly known as: NORVASC Take 1 tablet (5 mg total) by mouth daily.   b complex vitamins tablet Take 1 tablet by mouth daily.   Calcium-Magnesium 500-250 MG Tabs Take 1 tablet by mouth daily.   dexamethasone 4 MG tablet Commonly known as: DECADRON Take 3 tablets (12 mg total) by mouth once a week. What changed: when  to take this   DULoxetine 60 MG capsule Commonly known as: CYMBALTA TAKE 1 CAPSULE BY MOUTH EVERY DAY What changed: how much to take   Eliquis 5 MG Tabs tablet Generic drug: apixaban TAKE 1 TABLET BY MOUTH TWICE A DAY What changed:   how much to take  when to take this   ergocalciferol 1.25 MG (50000 UT) capsule Commonly known as: VITAMIN D2 Take 50,000 Units by mouth every Friday. In the morning   famotidine 40 MG tablet Commonly known as: PEPCID TAKE 1 TABLET BY MOUTH EVERY DAY   furosemide 20 MG tablet Commonly known as: LASIX TAKE 3 TABLETS BY MOUTH EVERY DAY What changed:   how much to take  when to take this  additional instructions   gabapentin 300 MG capsule Commonly known as:  NEURONTIN Take 1 capsule (300 mg total) by mouth 3 (three) times daily.   HYDROcodone-acetaminophen 5-325 MG tablet Commonly known as: NORCO/VICODIN Take 1 tablet by mouth every 6 (six) hours as needed for moderate pain.   loratadine 10 MG tablet Commonly known as: CLARITIN TAKE 1 TABLET BY MOUTH EVERY DAY   LORazepam 0.5 MG tablet Commonly known as: Ativan Take 1 tablet (0.5 mg total) by mouth every 8 (eight) hours as needed (Nausea or vomiting). What changed: when to take this   LUBRICATING EYE DROPS OP Place 1 drop into both eyes daily as needed (dry eyes).   metoprolol succinate 25 MG 24 hr tablet Commonly known as: TOPROL-XL TAKE 1 TABLET BY MOUTH TWICE A DAY What changed: when to take this   oxyCODONE 5 MG immediate release tablet Commonly known as: Oxy IR/ROXICODONE Take 2 tablets (10 mg total) by mouth every 4 (four) hours as needed for moderate pain or severe pain. What changed: when to take this   polyethylene glycol powder 17 GM/SCOOP powder Commonly known as: GLYCOLAX/MIRALAX Take 17-34 g by mouth 2 (two) times daily as needed for moderate constipation.   senna-docusate 8.6-50 MG tablet Commonly known as: Senokot-S Take 2 tablets by mouth at bedtime.   spironolactone 25 MG tablet Commonly known as: ALDACTONE Take 1 tablet (25 mg total) by mouth daily.   Vitamin D 50 MCG (2000 UT) tablet Take 2,000 Units by mouth daily.   Voltaren 1 % Gel Generic drug: diclofenac Sodium Apply 1 application topically 2 (two) times daily as needed (pain.).       Disposition: Discharge disposition: 01-Home or Self Care       Discharge Instructions    Diet - low sodium heart healthy   Complete by: As directed    Increase activity slowly   Complete by: As directed      Signed: Alphonsa Overall, M.D., [facility] Surgery Office:  [phone]  03/19/2020, 8:25 AM

## 2020-03-20 NOTE — Anesthesia Postprocedure Evaluation (Signed)
Anesthesia Post Note  Patient: [name] [name]  Procedure(s) Performed: OPEN LEFT INGUINAL HERNIA REPAIR (Left Abdomen)     Patient location during evaluation: PACU Anesthesia Type: General Level of consciousness: awake and alert Pain management: pain level controlled Vital Signs Assessment: post-procedure vital signs reviewed and stable Respiratory status: spontaneous breathing, nonlabored ventilation, respiratory function stable and patient connected to nasal cannula oxygen Cardiovascular status: blood pressure returned to baseline and stable Postop Assessment: no apparent nausea or vomiting Anesthetic complications: no   No complications documented.  Last Vitals:  Vitals:   03/19/20 0205 03/19/20 0411  BP: 102/63 108/68  Pulse: (!) 58 60  Resp:  16  Temp: 36.8 C 37 C  SpO2: 99% 98%    Last Pain:  Vitals:   03/19/20 0754  TempSrc:   PainSc: 0-No pain                 [name] [name] L Karlea Mckibbin

## 2020-04-07 NOTE — Progress Notes (Signed)
HEMATOLOGY/ONCOLOGY CLINIC NOTE  Date of Service: 04/08/2020  Patient Care Team: Cassandria Anger, MD as PCP - General Larey Dresser, MD as PCP - Advanced Heart Failure (Cardiology) Erline Levine, MD as Attending Physician (Neurosurgery) Jarome Matin, MD as Consulting Physician (Dermatology) Rigoberto Noel, MD as Consulting Physician (Pulmonary Disease) Alda Berthold, DO as Consulting Physician (Neurology)   CHIEF COMPLAINTS/PURPOSE OF CONSULTATION:   Plasma cell myeloma   HISTORY OF PRESENTING ILLNESS:  [name] [name] is a wonderful 79 y.o. male who has been referred to [place] by Angelena Form, PA for evaluation and management of lytic bone lesions. The pt reports that he is doing well overall.  The pt reports that he has occasional hip pain that radiates down his legs and prevents him from walking. He uses Advil, which helps his hip pain. He is used to exercising often, but has not been able to stay very physically active lately so he is gaining weight. The pt experiences some SOB when he wakes up in the morning.  The pt had a pre-procedural CTA C/A/P before a TAVR completed on 03/14/2019 which revealed "indeterminate osseus lesions in the bony pelvis," which led to an MRI and PET scan. He reports that he has pain when he pushes on his right chest.   He also notes that he had a fall while exercising in 06/2018 and thought he broke his back. He received a blood transfusion on 07/24/2018.   Of note prior to the patient's visit today, the pt has had a MRI pelvis w/wo contrast completed on 03/28/2019 with results revealing "1. Destructive bone lesions as detailed above. Findings most consistent with metastatic disease. PET-CT may be helpful for further evaluation and to establish a primary tumor. The right pelvic bone lesions should be amenable to image guided biopsy but a PET scan may demonstrate easier/safer biopsy sites. 2. No intrapelvic mass or adenopathy. 3. Benign  intraosseous lipoma involving the left anterior superior acetabulum."  The pt has also had PET whole body completed on 04/06/2019 with results revealing "1. Diffuse osseous metastatic disease as detailed above without findings for a primary neoplasm in the chest, abdomen or pelvis. The large destructive lesion involving the right ischium should be amenable to image guided biopsy. 2. Two small retroperitoneal lymph nodes and 1 small right obturator node showing hypermetabolism."   Most recent lab results (04/06/2019) of CBC is as follows: all values are WNL.  On review of systems, pt reports hip and leg pain, weight gain and denies syncope and denies belly pain, recent neuropathy and any other symptoms.   On PMHx the pt reports 5 cm hepatic flexure polyp removal, pneumonia, blood transfusion on 07/24/2018  On Social Hx the pt reports that he lives at home with his wife. He is from [place].    INTERVAL HISTORY:  [name] [name] is a wonderful 79 y.o. male who has who is here today for evaluation and management of his Plasma Cell Myeloma. Pt is accompanied by his wife. The patient's last visit with [place] was on 02/12/2020. The pt reports that he is doing well overall.  The pt reports he is still experiencing burning in his legs and feet and issues with his balance. Pt has fallen twice in the interim. Pt has completed PT and has noticed improvement in his left shoulder. He was recommended to use a walker by PT, but he prefers to use a cane. Pt is currently taking 20 mg Lasix every other day and  5 mg Oxycodone twice per day. Pt is also taking Amiodarone, which he was told may elevate his liver enzymes. Pt has not been consuming much alcohol, but may not be hydrating sufficiently.   Lab results today (04/08/20) of CBC w/diff and CMP is as follows: all values are WNL except for PLT at 130K, Abs Immature Granulocytes at 0.16K, Creatinine at 1.31, Total Protein at 6.1, ALT at 76, GFR Est Non Af Am at  51. 04/08/2020 MMP - no M spike 04/08/2020 K/L light chains - normal K/L ratio  On review of systems, pt reports burning/tingling in legs/feet, imbalance issues, polyuria and denies mouth sores, new bone pain and any other symptoms.   MEDICAL HISTORY:  Past Medical History:  Diagnosis Date  . Ascending aortic aneurysm ([place])   . Bicuspid aortic valve   . Cancer ([place])   . CHF NYHA class I (no symptoms from ordinary activities), acute, diastolic ([place])   . Dysrhythmia 2009   A fib  . Fatty liver    mild  . Fracture    left proximal humerus  . GERD (gastroesophageal reflux disease)   . GI bleeding 07/21/2018   post polypectomy  . Hemorrhoids   . HTN (hypertension)   . Hypercholesteremia   . Hypokalemia   . Internal hemorrhoids   . LBP (low back pain)   . Moderate aortic stenosis   . Osteoarthritis   . Paroxysmal atrial fibrillation ([place])    a. new onset Afib in 07/2008. He underwent ibutilide cardioversion successfully. b. Recurrence 01/2013 s/p TEE/DCCV - was on Xarelto but he stopped it as he was convinced it was causing joint pn. c. Recurrence 01/2016 - spont conv to NSR. Pt took Eliquis x1 mo then declined further anticoag. d. Recurrence 07/2016.  [name] Pneumonia   . Tubular adenoma of colon     SURGICAL HISTORY: Past Surgical History:  Procedure Laterality Date  . BACK SURGERY  x12 years ago  . CARDIAC CATHETERIZATION  2020  . CARDIAC VALVE REPLACEMENT  2020  . CARDIOVERSION N/A 01/26/2013   Procedure: CARDIOVERSION;  Surgeon: Larey Dresser, MD;  Location: [facility] ENDOSCOPY;  Service: Cardiovascular;  Laterality: N/A;  . CARDIOVERSION N/A 10/28/2017   Procedure: CARDIOVERSION;  Surgeon: Larey Dresser, MD;  Location: [facility] ENDOSCOPY;  Service: Cardiovascular;  Laterality: N/A;  . CARDIOVERSION N/A 03/03/2018   Procedure: CARDIOVERSION;  Surgeon: Lelon Perla, MD;  Location: [facility] ENDOSCOPY;  Service: Cardiovascular;  Laterality: N/A;  . CARDIOVERSION N/A 09/19/2019   Procedure:  CARDIOVERSION;  Surgeon: Larey Dresser, MD;  Location: [facility] ENDOSCOPY;  Service: Cardiovascular;  Laterality: N/A;  . COLONOSCOPY    . COLONOSCOPY  07/17/2018   at [facility]  . HEMORRHOID SURGERY    . INGUINAL HERNIA REPAIR Left 03/18/2020   Procedure: OPEN LEFT INGUINAL HERNIA REPAIR;  Surgeon: Alphonsa Overall, MD;  Location: [place];  Service: General;  Laterality: Left;  . LUMBAR LAMINECTOMY    . ORIF HUMERUS FRACTURE Left 01/15/2020   Procedure: OPEN REDUCTION INTERNAL FIXATION (ORIF) PROXIMAL HUMERUS FRACTURE;  Surgeon: Nicholes Stairs, MD;  Location: [place];  Service: Orthopedics;  Laterality: Left;  . POLYPECTOMY    . RIGHT HEART CATH N/A 08/20/2019   Procedure: RIGHT HEART CATH;  Surgeon: Larey Dresser, MD;  Location: [facility] CV LAB;  Service: Cardiovascular;  Laterality: N/A;  . RIGHT/LEFT HEART CATH AND CORONARY ANGIOGRAPHY N/A 03/07/2019   Procedure: RIGHT/LEFT HEART CATH AND CORONARY ANGIOGRAPHY;  Surgeon: Burnell Blanks, MD;  Location: [place] CV LAB;  Service: Cardiovascular;  Laterality: N/A;  . [name]  04/2019  . TEE WITHOUT CARDIOVERSION N/A 01/26/2013   Procedure: TRANSESOPHAGEAL ECHOCARDIOGRAM (TEE);  Surgeon: Larey Dresser, MD;  Location: [place];  Service: Cardiovascular;  Laterality: N/A;  . TEE WITHOUT CARDIOVERSION N/A 10/28/2017   Procedure: TRANSESOPHAGEAL ECHOCARDIOGRAM (TEE);  Surgeon: Larey Dresser, MD;  Location: [facility] ENDOSCOPY;  Service: Cardiovascular;  Laterality: N/A;  . TEE WITHOUT CARDIOVERSION N/A 05/08/2019   Procedure: TRANSESOPHAGEAL ECHOCARDIOGRAM (TEE);  Surgeon: Burnell Blanks, MD;  Location: [place] CV LAB;  Service: Open Heart Surgery;  Laterality: N/A;  . TEE WITHOUT CARDIOVERSION N/A 09/19/2019   Procedure: TRANSESOPHAGEAL ECHOCARDIOGRAM (TEE);  Surgeon: Larey Dresser, MD;  Location: [facility] ENDOSCOPY;  Service: Cardiovascular;  Laterality: N/A;  . TRANSCATHETER AORTIC VALVE REPLACEMENT, TRANSFEMORAL N/A 05/08/2019    Procedure: TRANSCATHETER AORTIC VALVE REPLACEMENT, TRANSFEMORAL;  Surgeon: Burnell Blanks, MD;  Location: [place] CV LAB;  Service: Open Heart Surgery;  Laterality: N/A;    SOCIAL HISTORY: Social History   Socioeconomic History  . Marital status: Married    Spouse name: Not on file  . Number of children: 0  . Years of education: Not on file  . Highest education level: Not on file  Occupational History  . Occupation: Retired Lobbyist: [employer]  Tobacco Use  . Smoking status: Never Smoker  . Smokeless tobacco: Never Used  Vaping Use  . Vaping Use: Never used  Substance and Sexual Activity  . Alcohol use: Not Currently  . Drug use: No  . Sexual activity: Yes  Other Topics Concern  . Not on file  Social History Narrative   Patient lives in [place] w/ his wife. He is a native of [place]. 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   Right-handed   Caffeine: 2 cups coffee per day   Two story home   Social Determinants of Health   Financial Resource Strain:   . Difficulty of Paying Living Expenses:   Food Insecurity:   . Worried About Charity fundraiser in the Last Year:   . Arboriculturist in the Last Year:   Transportation Needs:   . Film/video editor (Medical):   [name] Lack of Transportation (Non-Medical):   Physical Activity:   . Days of Exercise per Week:   . Minutes of Exercise per Session:   Stress:   . Feeling of Stress :   Social Connections:   . Frequency of Communication with Friends and Family:   . Frequency of Social Gatherings with Friends and Family:   . Attends Religious Services:   . Active Member of Clubs or Organizations:   . Attends Archivist Meetings:   [name] Marital Status:   Intimate Partner Violence:   . Fear of Current or Ex-Partner:   . Emotionally Abused:   [name] Physically Abused:   . Sexually Abused:     FAMILY HISTORY: Family History  Problem Relation Age  of Onset  . Colon cancer Mother 33  . Hypertension Other   . Coronary artery disease Neg Hx   . Colon polyps Neg Hx   . Esophageal cancer Neg Hx   . Rectal cancer Neg Hx   . Stomach cancer Neg Hx     ALLERGIES:  is allergic to xarelto [rivaroxaban], ambien [zolpidem], corticosteroids, ramipril, and benazepril.  MEDICATIONS:  Current Outpatient Medications  Medication Sig Dispense Refill  . acyclovir (ZOVIRAX) 400 MG tablet Take 1 tablet (400 mg total) by mouth 2 (two) times daily. 180 tablet 3  . amiodarone (PACERONE) 200 MG tablet TAKE 1 TABLET BY MOUTH EVERY DAY (Patient taking differently: Take 200 mg by mouth daily. ) 30 tablet 5  . amLODipine (NORVASC) 5 MG tablet Take 1 tablet (5 mg total) by mouth daily. 90 tablet 3  . b complex vitamins tablet Take 1 tablet by mouth daily. 100 tablet 3  . Calcium-Magnesium 500-250 MG TABS Take 1 tablet by mouth daily.    . Carboxymethylcellul-Glycerin (LUBRICATING EYE DROPS OP) Place 1 drop into both eyes daily as needed (dry eyes).    . Cholecalciferol (VITAMIN D) 50 MCG (2000 UT) tablet Take 2,000 Units by mouth daily.    [name] dexamethasone (DECADRON) 4 MG tablet Take 3 tablets (12 mg total) by mouth once a week. (Patient taking differently: Take 12 mg by mouth every Tuesday. ) 22 tablet 1  . diclofenac Sodium (VOLTAREN) 1 % GEL Apply 1 application topically 2 (two) times daily as needed (pain.).    [name] DULoxetine (CYMBALTA) 60 MG capsule TAKE 1 CAPSULE BY MOUTH EVERY DAY (Patient taking differently: Take 60 mg by mouth daily. ) 90 capsule 1  . ELIQUIS 5 MG TABS tablet TAKE 1 TABLET BY MOUTH TWICE A DAY (Patient taking differently: Take 5 mg by mouth in the morning and at bedtime. ) 180 tablet 3  . ergocalciferol (VITAMIN D2) 1.25 MG (50000 UT) capsule Take 50,000 Units by mouth every Friday. In the morning    . famotidine (PEPCID) 40 MG tablet TAKE 1 TABLET BY MOUTH EVERY DAY (Patient taking differently: Take 40 mg by mouth daily. ) 90 tablet 3  .  furosemide (LASIX) 20 MG tablet TAKE 3 TABLETS BY MOUTH EVERY DAY (Patient taking differently: Take 20 mg by mouth every other day. In the morning) 270 tablet 3  . gabapentin (NEURONTIN) 300 MG capsule Take 1 capsule (300 mg total) by mouth 3 (three) times daily.    [name] loratadine (CLARITIN) 10 MG tablet TAKE 1 TABLET BY MOUTH EVERY DAY (Patient taking differently: Take 10 mg by mouth daily. ) 90 tablet 3  . LORazepam (ATIVAN) 0.5 MG tablet Take 1 tablet (0.5 mg total) by mouth every 8 (eight) hours as needed (Nausea or vomiting). (Patient taking differently: Take 0.5 mg by mouth at bedtime. ) 60 tablet 2  . metoprolol succinate (TOPROL-XL) 25 MG 24 hr tablet TAKE 1 TABLET BY MOUTH TWICE A DAY (Patient taking differently: Take 25 mg by mouth in the morning and at bedtime. ) 180 tablet 3  . oxyCODONE (OXY IR/ROXICODONE) 5 MG immediate release tablet Take 2 tablets (10 mg total) by mouth every 4 (four) hours as needed for moderate pain or severe pain. (Patient taking differently: Take 10 mg by mouth in the morning and at bedtime. ) 40 tablet 0  . polyethylene glycol powder (GLYCOLAX/MIRALAX) 17 GM/SCOOP powder Take 17-34 g by mouth 2 (two) times daily as needed for moderate constipation. 500 g 5  . senna-docusate (SENOKOT-S) 8.6-50 MG tablet Take 2 tablets by mouth at bedtime.    [name] spironolactone (ALDACTONE) 25 MG tablet Take 1 tablet (25 mg total) by mouth daily. 30 tablet 3   No current facility-administered medications for this visit.    REVIEW OF SYSTEMS:   A 10+ POINT REVIEW OF SYSTEMS WAS OBTAINED including neurology, dermatology, psychiatry, cardiac, respiratory, lymph, extremities, GI, GU, Musculoskeletal,  constitutional, breasts, reproductive, HEENT.  All pertinent positives are noted in the HPI.  All others are negative.    PHYSICAL EXAMINATION: Vitals:   04/08/20 0911  BP: 120/69  Pulse: 55  Resp: 18  Temp: 97.7 F (36.5 C)  SpO2: 100%   Wt Readings from Last 3 Encounters:  04/08/20  166 lb 11.2 oz (75.6 kg)  03/18/20 167 lb (75.8 kg)  03/12/20 169 lb 8 oz (76.9 kg)   Body mass index is 23.92 kg/m.    ECOG FS:2 - Symptomatic, <50% confined to bed  Exam was given in a chair   GENERAL:alert, in no acute distress and comfortable SKIN: no acute rashes, no significant lesions EYES: conjunctiva are pink and non-injected, sclera anicteric OROPHARYNX: MMM, no exudates, no oropharyngeal erythema or ulceration NECK: supple, no JVD LYMPH:  no palpable lymphadenopathy in the cervical, axillary or inguinal regions LUNGS: clear to auscultation b/l with normal respiratory effort HEART: regular rate & rhythm ABDOMEN:  normoactive bowel sounds , non tender, not distended. No palpable hepatosplenomegaly.  Extremity: no pedal edema PSYCH: alert & oriented x 3 with fluent speech NEURO: no focal motor/sensory deficits  LABORATORY DATA:  I have reviewed the data as listed  . CBC Latest Ref Rng & Units 04/08/2020 03/12/2020 02/05/2020  WBC 4.0 - 10.5 K/uL 9.4 19.9(H) 8.8  Hemoglobin 13.0 - 17.0 g/dL 13.3 12.9(L) 12.4(L)  Hematocrit 39 - 52 % 41.7 40.4 38.5(L)  Platelets 150 - 400 K/uL 130(L) 178 156    . CMP Latest Ref Rng & Units 04/08/2020 03/12/2020 02/05/2020  Glucose 70 - 99 mg/dL 96 110(H) 79  BUN 8 - 23 mg/dL 23 31(H) 20  Creatinine 0.61 - 1.24 mg/dL 1.31(H) 0.97 1.00  Sodium 135 - 145 mmol/L 141 139 141  Potassium 3.5 - 5.1 mmol/L 4.6 4.7 4.2  Chloride 98 - 111 mmol/L 107 107 105  CO2 22 - 32 mmol/L _0 Calcium 8.9 - 10.3 mg/dL 9.5 9.5 9.1  Total Protein 6.5 - 8.1 g/dL 6.1(L) - 6.3(L)  Total Bilirubin 0.3 - 1.2 mg/dL 0.5 - 0.4  Alkaline Phos 38 - 126 U/L 71 - 98  AST 15 - 41 U/L 32 - 20  ALT 0 - 44 U/L 76(H) - 25   Component     Latest Ref Rng & Units 04/30/2019  Total Protein, Urine-UPE24     Not Estab. mg/dL   Total Protein, Urine-Ur/day     30 - 150 mg/24 hr   ALBUMIN, U     %   ALPHA 1 URINE     %   Alpha 2, Urine     %   % BETA, Urine     %     GAMMA GLOBULIN URINE     %   Free Kappa Lt Chains,Ur     0.63 - 113.79 mg/L   Free Lambda Lt Chains,Ur     0.47 - 11.77 mg/L   Free Kappa/Lambda Ratio     1.03 - 31.76   Immunofixation Result, Urine        Total Volume        M-SPIKE %, Urine     Not Observed %   M-Spike, mg/24 hr     Not Observed mg/24 hr   NOTE:        IgG (Immunoglobin G), Serum     603 - 1,613 mg/dL 1,740 (H)  IgA     61 - 437 mg/dL 16 (L)  IgM (  Immunoglobulin M), Srm     15 - 143 mg/dL 9 (L)  Total Protein ELP     6.0 - 8.5 g/dL 7.3  Albumin SerPl Elph-Mcnc     2.9 - 4.4 g/dL 3.8  Alpha 1     0.0 - 0.4 g/dL 0.3  Alpha2 Glob SerPl Elph-Mcnc     0.4 - 1.0 g/dL 0.7  B-Globulin SerPl Elph-Mcnc     0.7 - 1.3 g/dL 0.9  Gamma Glob SerPl Elph-Mcnc     0.4 - 1.8 g/dL 1.6  M Protein SerPl Elph-Mcnc     Not Observed g/dL 1.5 (H)  Globulin, Total     2.2 - 3.9 g/dL 3.5  Albumin/Glob SerPl     0.7 - 1.7 1.1  IFE 1      Comment  Please Note (HCV):      Comment  Kappa free light chain     3.3 - 19.4 mg/L 821.2 (H)  Lamda free light chains     5.7 - 26.3 mg/L 1.9 (L)  Kappa, lamda light chain ratio     0.26 - 1.65 432.21 (H)  LDH     98 - 192 U/L 132  Sed Rate     0 - 16 mm/hr 3      05/24/2019 Bone Marrow Biopsy    04/16/2019 Surgical Pathology:   RADIOGRAPHIC STUDIES: I have personally reviewed the radiological images as listed and agreed with the findings in the report. No results found.  ASSESSMENT & PLAN:   79 yo   #1 Recently diagnosed Plasma cell myeloma  03/28/2019 MRI pelvis w/wo contrast revealed "1. Destructive bone lesions as detailed above. Findings most consistent with metastatic disease. PET-CT may be helpful for further evaluation and to establish a primary tumor. The right pelvic bone lesions should be amenable to image guided biopsy but a PET scan may demonstrate easier/safer biopsy sites. 2. No intrapelvic mass or adenopathy. 3. Benign intraosseous lipoma involving  the left anterior superior acetabulum."  04/06/2019 PET whole body revealed "1. Diffuse osseous metastatic disease as detailed above without findings for a primary neoplasm in the chest, abdomen or pelvis. The large destructive lesion involving the right ischium should be amenable to image guided biopsy. 2. Two small retroperitoneal lymph nodes and 1 small right obturator node showing hypermetabolism."   04/16/2019 Posterior right pelvis bone biopsy revealed "PLASMA CELL NEOPLASM"  05/24/2019 Bone Marrow Biopsy revealed "BONE MARROW: - CELLULAR MARROW WITH INVOLVEMENT BY PLASMA CELL NEOPLASM (20%) PERIPHERAL BLOOD: - MORPHOLOGICALLY UNREMARKABLE"  05/24/2019 FISH Panel revealed "ABNORMAL result with 11q+, 14q+ and +17"  #2 Severe aortic stenosis with bicuspid aortic valve -10/26/2018 ECHO revealed AVA at 0.8 cm2 and LV EF of 60-65% -05/08/2019 pt had a Transfemoral Transcatheter Aortic Valve Replacement   PLAN: -Discussed pt labwork today, 04/08/20; mild thrombocytopenia, Hgb & WBC are nml, ALT & kidney numbers are elevated.  -04/08/2020 MMP - no M spike 04/08/2020 K/L light chains - normal K/L ratio -No lab or clinical evidence of Multiple Myeloma recurrence at this time. No indication to restart treatment.   -Advised pt that dehydration and medications are likely the cause of elevated liver enzymes and kidney numbers.  -Recommended that the pt continue to eat well, drink at least 48-64 oz of water each day, and walk 20-30 minutes daily.  -Recommend pt use a walker as much as possible. Advised pt that he is an at advanced risk for a serious fall.  -Recommend pt f/u with PCP for Amiodarone  monitoring -Refill Cymbalta, Oxycodone -Continue Zometa q8weeks  -Will see back in 2 months with labs    FOLLOW UP: Plz schedule Zometa every 8 weeks for next 4 doses RTC with Dr Irene Limbo with labs in 8 weeks with next zometa dose   The total time spent in the appt was 30 minutes and more than 50% was  on counseling and direct patient cares.  All of the patient's questions were answered with apparent satisfaction. The patient knows to call the clinic with any problems, questions or concerns.   Sullivan Lone MD [place] AAHIVMS [facility] Hematology/Oncology Physician [facility]  (Office):       [phone] (Work cell):  [phone] (Fax):           [phone]  04/08/2020 10:05 AM  I, Yevette Edwards, am acting as a scribe for Dr. Sullivan Lone.   .I have reviewed the above documentation for accuracy and completeness, and I agree with the above. Brunetta Genera MD

## 2020-04-08 ENCOUNTER — Other Ambulatory Visit: Payer: Self-pay

## 2020-04-08 ENCOUNTER — Inpatient Hospital Stay: Payer: Medicare Other | Attending: Hematology

## 2020-04-08 ENCOUNTER — Inpatient Hospital Stay: Payer: Medicare Other

## 2020-04-08 ENCOUNTER — Inpatient Hospital Stay (HOSPITAL_BASED_OUTPATIENT_CLINIC_OR_DEPARTMENT_OTHER): Payer: Medicare Other | Admitting: Hematology

## 2020-04-08 VITALS — BP 120/69 | HR 55 | Temp 97.7°F | Resp 18 | Ht 70.0 in | Wt 166.7 lb

## 2020-04-08 DIAGNOSIS — C9000 Multiple myeloma not having achieved remission: Secondary | ICD-10-CM | POA: Insufficient documentation

## 2020-04-08 DIAGNOSIS — G629 Polyneuropathy, unspecified: Secondary | ICD-10-CM

## 2020-04-08 DIAGNOSIS — C7951 Secondary malignant neoplasm of bone: Secondary | ICD-10-CM | POA: Diagnosis not present

## 2020-04-08 DIAGNOSIS — Z7189 Other specified counseling: Secondary | ICD-10-CM

## 2020-04-08 LAB — CBC WITH DIFFERENTIAL/PLATELET
Abs Immature Granulocytes: 0.16 10*3/uL — ABNORMAL HIGH (ref 0.00–0.07)
Basophils Absolute: 0.1 10*3/uL (ref 0.0–0.1)
Basophils Relative: 1 %
Eosinophils Absolute: 0.5 10*3/uL (ref 0.0–0.5)
Eosinophils Relative: 5 %
HCT: 41.7 % (ref 39.0–52.0)
Hemoglobin: 13.3 g/dL (ref 13.0–17.0)
Immature Granulocytes: 2 %
Lymphocytes Relative: 13 %
Lymphs Abs: 1.2 10*3/uL (ref 0.7–4.0)
MCH: 29.6 pg (ref 26.0–34.0)
MCHC: 31.9 g/dL (ref 30.0–36.0)
MCV: 92.9 fL (ref 80.0–100.0)
Monocytes Absolute: 0.8 10*3/uL (ref 0.1–1.0)
Monocytes Relative: 9 %
Neutro Abs: 6.7 10*3/uL (ref 1.7–7.7)
Neutrophils Relative %: 70 %
Platelets: 130 10*3/uL — ABNORMAL LOW (ref 150–400)
RBC: 4.49 MIL/uL (ref 4.22–5.81)
RDW: 13.9 % (ref 11.5–15.5)
WBC: 9.4 10*3/uL (ref 4.0–10.5)
nRBC: 0 % (ref 0.0–0.2)

## 2020-04-08 LAB — CMP (CANCER CENTER ONLY)
ALT: 76 U/L — ABNORMAL HIGH (ref 0–44)
AST: 32 U/L (ref 15–41)
Albumin: 4.1 g/dL (ref 3.5–5.0)
Alkaline Phosphatase: 71 U/L (ref 38–126)
Anion gap: 10 (ref 5–15)
BUN: 23 mg/dL (ref 8–23)
CO2: 24 mmol/L (ref 22–32)
Calcium: 9.5 mg/dL (ref 8.9–10.3)
Chloride: 107 mmol/L (ref 98–111)
Creatinine: 1.31 mg/dL — ABNORMAL HIGH (ref 0.61–1.24)
GFR, Est AFR Am: 59 mL/min — ABNORMAL LOW (ref >60)
GFR, Estimated: 51 mL/min — ABNORMAL LOW (ref >60)
Glucose, Bld: 96 mg/dL (ref 70–99)
Potassium: 4.6 mmol/L (ref 3.5–5.1)
Sodium: 141 mmol/L (ref 135–145)
Total Bilirubin: 0.5 mg/dL (ref 0.3–1.2)
Total Protein: 6.1 g/dL — ABNORMAL LOW (ref 6.5–8.1)

## 2020-04-08 MED ORDER — ZOLEDRONIC ACID 4 MG/100ML IV SOLN
4.0000 mg | Freq: Once | INTRAVENOUS | Status: AC
  Administered 2020-04-08: 4 mg via INTRAVENOUS

## 2020-04-08 MED ORDER — ZOLEDRONIC ACID 4 MG/100ML IV SOLN
INTRAVENOUS | Status: AC
  Filled 2020-04-08: qty 100

## 2020-04-08 MED ORDER — SODIUM CHLORIDE 0.9 % IV SOLN
INTRAVENOUS | Status: DC
  Filled 2020-04-08: qty 250

## 2020-04-08 NOTE — Patient Instructions (Signed)
Zoledronic Acid injection (Hypercalcemia, Oncology) What is this medicine? ZOLEDRONIC ACID (ZOE le dron ik AS id) lowers the amount of calcium loss from bone. It is used to treat too much calcium in your blood from cancer. It is also used to prevent complications of cancer that has spread to the bone. This medicine may be used for other purposes; ask your health care provider or pharmacist if you have questions. COMMON BRAND NAME(S): Zometa What should I tell my health care provider before I take this medicine? They need to know if you have any of these conditions:  aspirin-sensitive asthma  cancer, especially if you are receiving medicines used to treat cancer  dental disease or wear dentures  infection  kidney disease  receiving corticosteroids like dexamethasone or prednisone  an unusual or allergic reaction to zoledronic acid, other medicines, foods, dyes, or preservatives  pregnant or trying to get pregnant  breast-feeding How should I use this medicine? This medicine is for infusion into a vein. It is given by a health care professional in a hospital or clinic setting. Talk to your pediatrician regarding the use of this medicine in children. Special care may be needed. Overdosage: If you think you have taken too much of this medicine contact a poison control center or emergency room at once. NOTE: This medicine is only for you. Do not share this medicine with others. What if I miss a dose? It is important not to miss your dose. Call your doctor or health care professional if you are unable to keep an appointment. What may interact with this medicine?  certain antibiotics given by injection  NSAIDs, medicines for pain and inflammation, like ibuprofen or naproxen  some diuretics like bumetanide, furosemide  teriparatide  thalidomide This list may not describe all possible interactions. Give your health care provider a list of all the medicines, herbs, non-prescription  drugs, or dietary supplements you use. Also tell them if you smoke, drink alcohol, or use illegal drugs. Some items may interact with your medicine. What should I watch for while using this medicine? Visit your doctor or health care professional for regular checkups. It may be some time before you see the benefit from this medicine. Do not stop taking your medicine unless your doctor tells you to. Your doctor may order blood tests or other tests to see how you are doing. Women should inform their doctor if they wish to become pregnant or think they might be pregnant. There is a potential for serious side effects to an unborn child. Talk to your health care professional or pharmacist for more information. You should make sure that you get enough calcium and vitamin D while you are taking this medicine. Discuss the foods you eat and the vitamins you take with your health care professional. Some people who take this medicine have severe bone, joint, and/or muscle pain. This medicine may also increase your risk for jaw problems or a broken thigh bone. Tell your doctor right away if you have severe pain in your jaw, bones, joints, or muscles. Tell your doctor if you have any pain that does not go away or that gets worse. Tell your dentist and dental surgeon that you are taking this medicine. You should not have major dental surgery while on this medicine. See your dentist to have a dental exam and fix any dental problems before starting this medicine. Take good care of your teeth while on this medicine. Make sure you see your dentist for regular follow-up   appointments. What side effects may I notice from receiving this medicine? Side effects that you should report to your doctor or health care professional as soon as possible:  allergic reactions like skin rash, itching or hives, swelling of the face, lips, or tongue  anxiety, confusion, or depression  breathing problems  changes in vision  eye  pain  feeling faint or lightheaded, falls  jaw pain, especially after dental work  mouth sores  muscle cramps, stiffness, or weakness  redness, blistering, peeling or loosening of the skin, including inside the mouth  trouble passing urine or change in the amount of urine Side effects that usually do not require medical attention (report to your doctor or health care professional if they continue or are bothersome):  bone, joint, or muscle pain  constipation  diarrhea  fever  hair loss  irritation at site where injected  loss of appetite  nausea, vomiting  stomach upset  trouble sleeping  trouble swallowing  weak or tired This list may not describe all possible side effects. Call your doctor for medical advice about side effects. You may report side effects to FDA at 1-800-FDA-1088. Where should I keep my medicine? This drug is given in a hospital or clinic and will not be stored at home. NOTE: This sheet is a summary. It may not cover all possible information. If you have questions about this medicine, talk to your doctor, pharmacist, or health care provider.  2020 [employer] (2014-01-26 14:19:39)  

## 2020-04-09 ENCOUNTER — Telehealth: Payer: Self-pay | Admitting: Hematology

## 2020-04-09 DIAGNOSIS — Z4789 Encounter for other orthopedic aftercare: Secondary | ICD-10-CM | POA: Diagnosis not present

## 2020-04-09 LAB — KAPPA/LAMBDA LIGHT CHAINS
Kappa free light chain: 7.6 mg/L (ref 3.3–19.4)
Kappa, lambda light chain ratio: 0.95 (ref 0.26–1.65)
Lambda free light chains: 8 mg/L (ref 5.7–26.3)

## 2020-04-09 NOTE — Telephone Encounter (Signed)
Scheduled per 07/27 los, spoke with patient's spouse. Patient will be notified of upcoming appointments.

## 2020-04-10 LAB — MULTIPLE MYELOMA PANEL, SERUM
Albumin SerPl Elph-Mcnc: 3.9 g/dL (ref 2.9–4.4)
Albumin/Glob SerPl: 2 — ABNORMAL HIGH (ref 0.7–1.7)
Alpha 1: 0.2 g/dL (ref 0.0–0.4)
Alpha2 Glob SerPl Elph-Mcnc: 0.6 g/dL (ref 0.4–1.0)
B-Globulin SerPl Elph-Mcnc: 0.9 g/dL (ref 0.7–1.3)
Gamma Glob SerPl Elph-Mcnc: 0.3 g/dL — ABNORMAL LOW (ref 0.4–1.8)
Globulin, Total: 2 g/dL — ABNORMAL LOW (ref 2.2–3.9)
IgA: 51 mg/dL — ABNORMAL LOW (ref 61–437)
IgG (Immunoglobin G), Serum: 292 mg/dL — ABNORMAL LOW (ref 603–1613)
IgM (Immunoglobulin M), Srm: 39 mg/dL (ref 15–143)
Total Protein ELP: 5.9 g/dL — ABNORMAL LOW (ref 6.0–8.5)

## 2020-04-14 ENCOUNTER — Other Ambulatory Visit: Payer: Self-pay | Admitting: Hematology

## 2020-04-14 MED ORDER — DULOXETINE HCL 60 MG PO CPEP: 60.0000 mg | ORAL_CAPSULE | Freq: Every day | ORAL | 2 refills | Status: DC

## 2020-04-14 MED ORDER — OXYCODONE HCL 5 MG PO TABS: 10.0000 mg | ORAL_TABLET | ORAL | 0 refills | Status: DC | PRN

## 2020-04-15 ENCOUNTER — Other Ambulatory Visit: Payer: Self-pay | Admitting: Internal Medicine

## 2020-04-15 ENCOUNTER — Other Ambulatory Visit: Payer: Self-pay

## 2020-04-15 ENCOUNTER — Ambulatory Visit: Payer: Medicare Other | Attending: Hematology | Admitting: Physical Therapy

## 2020-04-15 DIAGNOSIS — R293 Abnormal posture: Secondary | ICD-10-CM | POA: Insufficient documentation

## 2020-04-15 DIAGNOSIS — Z7189 Other specified counseling: Secondary | ICD-10-CM

## 2020-04-15 DIAGNOSIS — R296 Repeated falls: Secondary | ICD-10-CM | POA: Insufficient documentation

## 2020-04-15 DIAGNOSIS — M6281 Muscle weakness (generalized): Secondary | ICD-10-CM | POA: Insufficient documentation

## 2020-04-15 DIAGNOSIS — C9000 Multiple myeloma not having achieved remission: Secondary | ICD-10-CM

## 2020-04-15 DIAGNOSIS — I890 Lymphedema, not elsewhere classified: Secondary | ICD-10-CM | POA: Diagnosis not present

## 2020-04-15 DIAGNOSIS — R262 Difficulty in walking, not elsewhere classified: Secondary | ICD-10-CM | POA: Insufficient documentation

## 2020-04-15 NOTE — Therapy (Signed)
**Note [name]-Identified via Obfuscation** [place], [place], [address] Phone: [phone]   Fax:  [phone]  Physical Therapy Treatment  Patient Details  Name: [name] [name] MRN: [id number] Date of Birth: Sep 09, 1941 Referring Provider (PT): Dr. Irene Limbo    Encounter Date: 04/15/2020   PT End of Session - 04/15/20 1921    Visit Number 3    Number of Visits 20    Date for PT Re-Evaluation 06/03/20    PT Start Time 1500    PT Stop Time 1545    PT Time Calculation (min) 45 min    Activity Tolerance Patient tolerated treatment well    Behavior During Therapy [facility] for tasks assessed/performed           Past Medical History:  Diagnosis Date  . Ascending aortic aneurysm ([name] [name])   . Bicuspid aortic valve   . Cancer ([name] [name])   . CHF NYHA class I (no symptoms from ordinary activities), acute, diastolic ([name] [name])   . Dysrhythmia 2009   A fib  . Fatty liver    mild  . Fracture    left proximal humerus  . GERD (gastroesophageal reflux disease)   . GI bleeding 07/21/2018   post polypectomy  . Hemorrhoids   . HTN (hypertension)   . Hypercholesteremia   . Hypokalemia   . Internal hemorrhoids   . LBP (low back pain)   . Moderate aortic stenosis   . Osteoarthritis   . Paroxysmal atrial fibrillation ([name] [name])    a. new onset Afib in 07/2008. He underwent ibutilide cardioversion successfully. b. Recurrence 01/2013 s/p TEE/DCCV - was on Xarelto but he stopped it as he was convinced it was causing joint pn. c. Recurrence 01/2016 - spont conv to NSR. Pt took Eliquis x1 mo then declined further anticoag. d. Recurrence 07/2016.  [name] [name] Pneumonia   . Tubular adenoma of colon     Past Surgical History:  Procedure Laterality Date  . BACK SURGERY  x12 years ago  . CARDIAC CATHETERIZATION  2020  . CARDIAC VALVE REPLACEMENT  2020  . CARDIOVERSION N/A 01/26/2013   Procedure: CARDIOVERSION;  Surgeon: [name] Dresser, MD;  Location: [facility] ENDOSCOPY;  Service: Cardiovascular;   Laterality: N/A;  . CARDIOVERSION N/A 10/28/2017   Procedure: CARDIOVERSION;  Surgeon: [name] Dresser, MD;  Location: [facility] ENDOSCOPY;  Service: Cardiovascular;  Laterality: N/A;  . CARDIOVERSION N/A 03/03/2018   Procedure: CARDIOVERSION;  Surgeon: [name] Perla, MD;  Location: [facility] ENDOSCOPY;  Service: Cardiovascular;  Laterality: N/A;  . CARDIOVERSION N/A 09/19/2019   Procedure: CARDIOVERSION;  Surgeon: [name] Dresser, MD;  Location: [facility] ENDOSCOPY;  Service: Cardiovascular;  Laterality: N/A;  . COLONOSCOPY    . COLONOSCOPY  07/17/2018   at [facility]  . HEMORRHOID SURGERY    . INGUINAL HERNIA REPAIR Left 03/18/2020   Procedure: OPEN LEFT INGUINAL HERNIA REPAIR;  Surgeon: [name] Overall, MD;  Location: [place];  Service: General;  Laterality: Left;  . LUMBAR LAMINECTOMY    . ORIF HUMERUS FRACTURE Left 01/15/2020   Procedure: OPEN REDUCTION INTERNAL FIXATION (ORIF) PROXIMAL HUMERUS FRACTURE;  Surgeon: [name] Stairs, MD;  Location: [place];  Service: Orthopedics;  Laterality: Left;  . POLYPECTOMY    . RIGHT HEART CATH N/A 08/20/2019   Procedure: RIGHT HEART CATH;  Surgeon: [name] Dresser, MD;  Location: [place] CV LAB;  Service: Cardiovascular;  Laterality: N/A;  . RIGHT/LEFT HEART CATH AND CORONARY ANGIOGRAPHY N/A 03/07/2019   Procedure: RIGHT/LEFT HEART CATH AND CORONARY ANGIOGRAPHY;  Surgeon: [name] Blanks, MD;  Location: [place] CV LAB;  Service: Cardiovascular;  Laterality: N/A;  . [name] [name]  04/2019  . TEE WITHOUT CARDIOVERSION N/A 01/26/2013   Procedure: TRANSESOPHAGEAL ECHOCARDIOGRAM (TEE);  Surgeon: [name] Dresser, MD;  Location: [place];  Service: Cardiovascular;  Laterality: N/A;  . TEE WITHOUT CARDIOVERSION N/A 10/28/2017   Procedure: TRANSESOPHAGEAL ECHOCARDIOGRAM (TEE);  Surgeon: [name] Dresser, MD;  Location: [facility] ENDOSCOPY;  Service: Cardiovascular;  Laterality: N/A;  . TEE WITHOUT CARDIOVERSION N/A 05/08/2019   Procedure: TRANSESOPHAGEAL ECHOCARDIOGRAM (TEE);   Surgeon: [name] Blanks, MD;  Location: [place] CV LAB;  Service: Open Heart Surgery;  Laterality: N/A;  . TEE WITHOUT CARDIOVERSION N/A 09/19/2019   Procedure: TRANSESOPHAGEAL ECHOCARDIOGRAM (TEE);  Surgeon: [name] Dresser, MD;  Location: [facility] ENDOSCOPY;  Service: Cardiovascular;  Laterality: N/A;  . TRANSCATHETER AORTIC VALVE REPLACEMENT, TRANSFEMORAL N/A 05/08/2019   Procedure: TRANSCATHETER AORTIC VALVE REPLACEMENT, TRANSFEMORAL;  Surgeon: [name] Blanks, MD;  Location: [place] CV LAB;  Service: Open Heart Surgery;  Laterality: N/A;    There were no vitals filed for this visit.   Subjective Assessment - 04/15/20 1509    Subjective Pt returns to PT to get some relief from pain and swelling in his feet and to get his legs stronger  and get more stable    Patient is accompained by: Family member    Pertinent History During preliminary work up for valve replacement in July of 2020 it was discovered that pt had large lytic lesions in his pelvis due to  multiple myeloma.  He had a TAVR in August 2021  and started chemotherapy for multiple myeloma which was successful in reducing the lestions. However,  he developed peripheral neuropathy and chemotherapy is being adjusted. He has multiple hospitalizations in dec-jan 2021 for septic colitis with acute kidney injury and pneumonia with associated deconditioning and difficutly walking due to Chemotherapy induced peripheral neuropathy.  Pt  had home health PT and continues to walk with a cane. Past history also significant for afib and back surgery.  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    Patient Stated Goals To get relief with pain in feet and decrease swelling, get more stable  and walk without the came    Currently in Pain? Yes    Pain Score 5     Pain Location Foot    Pain Orientation Left;Right   left one is a little bit more swollend             [facility] PT Assessment - 04/15/20 0001      Assessment    Medical Diagnosis multiple myeloma     Referring Provider (PT) Dr. Irene Limbo     Onset Date/Surgical Date 04/14/19      Precautions   Precautions Fall      Restrictions   Weight Bearing Restrictions No      [place] residence    Living Arrangements Spouse/significant other    Home Access [name] to enter    Entrance [name]-Number of Steps 3    Entrance [name]-Rails Left    Additional Comments two-story home      Prior Function   Level of [place] with household mobility with device   straight cane   Vocation Retired    Leisure pt says he is exercising a little at home       Cognition   [name] Cognitive Status Within Functional Limits  for tasks assessed   needs occasional redirection to task      Observation/Other Assessments   Observations --    Skin Integrity --      Observation/Other Assessments-Edema    Edema Circumferential      Sensation   Light Touch Impaired by gross assessment    Additional Comments pt says he cannot tell if his socks are off or on      Coordination   Gross Motor Movements are Fluid and Coordinated No   very slow      Functional Tests   Functional tests Sit to Stand      Sit to Stand   Comments 8 rep in 30 seconds       Posture/Postural Control   Posture/Postural Control Postural limitations    Postural Limitations Rounded Shoulders;Forward head;Decreased lumbar lordosis;Increased thoracic kyphosis      AROM   [name] AROM  Deficits    [name] AROM Comments limited at end ranges of all joints., decreased dorsiflexion impairing heel strike in gait       Strength   [name] Strength Deficits    [name] Strength Comments pt is able to hold against and isometric force in a "hold" test, but had decreased dynamic strength in funtional mobility with very slow transistion movements and extra help needed if standing from a low surface and decrased ability to perform repeated sit to stands     Right  Hand Grip (lbs) 68/75/75    Left Hand Grip (lbs) 75/70/70.    Right Hip Flexion 4-/5    Right Hip ABduction 3-/5    Left Hip Flexion 4-/5    Left Hip ABduction 3-/5      Bed Mobility   Bed Mobility Rolling Right;Rolling Left;Supine to Sit;Sit to Supine   excessive time required    Rolling Right Independent    Rolling Left Independent    Supine to Sit Independent    Sit to Supine Independent      Ambulation/Gait   Ambulation/Gait Yes    Ambulation/Gait Assistance 4: Min guard    Assistive device Straight cane    Gait Pattern Step-through pattern;Decreased arm swing - right;Decreased arm swing - left;Decreased step length - right;Decreased step length - left;Decreased hip/knee flexion - right;Decreased hip/knee flexion - left;Decreased dorsiflexion - right;Decreased dorsiflexion - left;Decreased weight shift to right;Decreased weight shift to left;Shuffle;Decreased trunk rotation;Trunk flexed;Poor foot clearance - left;Poor foot clearance - right    Gait velocity very slow       Standardized Balance Assessment   Standardized Balance Assessment Timed Up and Go Test      Berg Balance Test   Sit to Stand Able to stand  independently using hands    Standing Unsupported Able to stand safely 2 minutes    Sitting with Back Unsupported but Feet Supported on Floor or Stool Able to sit safely and securely 2 minutes    Stand to Sit Sits safely with minimal use of hands    Transfers Able to transfer safely, definite need of hands    Standing Unsupported with Eyes Closed Able to stand 10 seconds safely    Standing Unsupported with Feet Together Able to place feet together independently and stand 1 minute safely    From Standing, Reach Forward with Outstretched Arm Can reach forward >5 cm safely (2")    From Standing Position, Pick up Object from Floor Able to pick up shoe, needs supervision    From Standing Position, Turn to Look Behind Over  each Shoulder Turn sideways only but maintains balance      Turn 360 Degrees Able to turn 360 degrees safely one side only in 4 seconds or less    Standing Unsupported, Alternately Place Feet on Step/Stool Able to stand independently and complete 8 steps >20 seconds    Standing Unsupported, One Foot in Front Needs help to step but can hold 15 seconds    Standing on One Leg Tries to lift leg/unable to hold 3 seconds but remains standing independently    Total Score 41      Timed Up and Go Test   TUG Normal TUG    Normal TUG (seconds) 25.35                         [facility] Adult PT Treatment/Exercise - 04/15/20 0001      Self-Care   Self-Care Other Self-Care Comments    Other Self-Care Comments  provided elevation for pt feet during part of session and he did have reduction in pitting edema with this and gentle MLD.  Talked with pt and wife about MLD and bandaging and she wants to do this next session Provided Tg soft for ankle and gave her informatoin about Edema wear f                    PT Short Term Goals - 04/15/20 1925      PT SHORT TERM GOAL #1   Title Pt and wife feel that they are able to manage pt feet and ankle swelling at home    Time 4    Period Weeks    Status New      PT SHORT TERM GOAL #2   Title Pt will increase # of reps of sit to stand to 10 reps in 30 seconds indicating an improvment in functional strength    Baseline 5 reps in 30 seconds on 12/31/2019    Time 4    Period Weeks    Status Revised      PT SHORT TERM GOAL #3   Title Pt will decrease normal TUG score to < 14 seconds indicating an improvmemnt in functional mobility    Baseline 25.35 sec on 12/31/2019, 16.47 sec on 04/15/20    Time 4    Period Weeks    Status Revised             PT Long Term Goals - 04/15/20 1927      PT LONG TERM GOAL #1   Title Pt will report the pain in his legs is decreased to 4/10    Baseline 6/10 on eval, 5/10 at 04/15/2020    Time 8    Period Weeks    Status On-going      PT LONG TERM GOAL #2    Title Pt will report his tiredness with walking is decreased by 50%    Time 8    Period Weeks    Status On-going      PT LONG TERM GOAL #3   Title Pt will increase # of reps of sit to stand to 10 reps in 30 seconds indicating an improvment in functional strength    Baseline 5 reps in 30 secons on 12/31/2019, 8 reps on 04/15/2020    Time 8    Period Weeks    Status Revised      PT LONG TERM GOAL #4   Title Pt will decrease normal TUG score to <  12  seconds indicating an improvmemnt in functional mobility    Baseline 25.35 on 12/31/2019, 16.46. on 04/15/2020    Time 8    Period Weeks    Status Revised      PT LONG TERM GOAL #5   Title Pt will increae BERG balancer score to > 46 for moderate risk for fall    Baseline 01/07/20 score is 36 and < 36 is high risk for falls if walking without at device ( close to 100%), 04/15/2020 score is 41    Time 8    Period Weeks    Status On-going      PT LONG TERM GOAL #6   Title Pt will be able to do a floor to stand transfer with min assist    Time 8    Period Weeks    Status On-going                 Plan - 04/15/20 1922    Clinical Impression Statement Pt comes back to PT after he had been away due to a fall with fractured shoulder and ORIF/Rehab and inguinal repair surgery that he has recovered well from. He has improved in his intial funcitonal test scores taken in April and wants to continue improvment with gait stability and decrease in swelling and pain in feet.    Personal Factors and Comorbidities Age;Comorbidity 3+;Fitness;Past/Current Experience    Comorbidities a fib, recent sepsis, kidney injury and pneumonia, ongoing chemo, past back surgery    Examination-Activity Limitations Stand;Transfers;[name]    Stability/Clinical Decision Making Evolving/Moderate complexity    Clinical Decision Making Moderate    Rehab Potential Good    PT Frequency 2x / week    PT Duration 8 weeks    PT Treatment/Interventions ADLs/Self Care Home  Management;DME Instruction;Gait training;Stair training;Balance training;Therapeutic exercise;Therapeutic activities;Functional mobility training;Neuromuscular re-education;Cognitive remediation;Patient/family education;Manual lymph drainage;Orthotic Fit/Training;Manual techniques;Compression bandaging;Passive range of motion;Taping    PT Next Visit Plan Measure feet.  Teach wife MLD and bandaging to both feet to decrease edemal Gait training  change direction, change speeds, direction, etc, stair training.  Eventually do floor to stand transfers  Progress to UE and LE strengthening, then standing work for balance and strength and advanced gait.    Consulted and Agree with Plan of Care Patient           Patient will benefit from skilled therapeutic intervention in order to improve the following deficits and impairments:  Abnormal gait, Decreased balance, Decreased endurance, Decreased mobility, Difficulty walking, Impaired sensation, Impaired perceived functional ability, Increased edema, Decreased knowledge of precautions, Decreased activity tolerance, Decreased strength, Pain, Postural dysfunction  Visit Diagnosis: Difficulty in walking, not elsewhere classified - Plan: PT plan of care cert/re-cert  Muscle weakness (generalized) - Plan: PT plan of care cert/re-cert  Abnormal posture - Plan: PT plan of care cert/re-cert  Repeated falls - Plan: PT plan of care cert/re-cert  Lymphedema, not elsewhere classified - Plan: PT plan of care cert/re-cert     Problem List Patient Active Problem List   Diagnosis Date Noted  . Closed fracture of left proximal humerus 01/15/2020  . Acute cystitis without hematuria 11/07/2019  . Left inguinal hernia 11/07/2019  . Physical deconditioning   . Depression with anxiety   . Acute respiratory failure with hypoxia ([place])   . Ischemic colitis ([place]) 09/18/2019  . Pressure injury of skin 09/16/2019  . Bloody diarrhea 09/14/2019  . Abdominal pain, diffuse  09/14/2019  . Acute metabolic encephalopathy 24/23/5361  .  Goals of care, counseling/discussion 09/14/2019  . DNR (do not resuscitate) 09/14/2019  . Peripheral neuropathy 09/13/2019  . Gait disorder 09/13/2019  . Multiple myeloma not having achieved remission ([place]) 05/17/2019  . Counseling regarding advance care planning and goals of care 05/17/2019  . Bone metastases ([place]) 05/17/2019  . Postoperative fever 05/09/2019  . S/P TAVR (transcatheter aortic valve replacement) 05/08/2019  . Multiple myeloma ([place]) 05/08/2019  . Acute on chronic diastolic heart failure ([place]) 05/08/2019  . Severe aortic stenosis   . Polymyalgia rheumatica syndrome ([place]) 01/29/2019  . CAD (coronary artery disease) 08/01/2018  . Hematochezia 07/27/2018  . HTN (hypertension) 02/28/2018  . Hypercholesteremia 02/28/2018  . Degeneration of lumbar intervertebral disc 12/06/2017  . Adenomatous polyp of ascending colon 12/02/2017  . Chronic low back pain 09/26/2017  . Mastoiditis 03/21/2017  . Abnormal TSH 12/23/2016  . PNA (pneumonia)   . AKI (acute kidney injury) ([place])   . Ascending aortic aneurysm ([place]) 02/15/2013  . Atrial fibrillation with RVR ([place]) 01/23/2013  . Well adult exam 05/31/2012  . PAF (paroxysmal atrial fibrillation) ([place]) 08/05/2008  . HLD (hyperlipidemia) 04/08/2007  . Essential hypertension 04/08/2007   Donato Heinz. Owens Shark PT  Norwood Levo 04/15/2020, 7:33 PM  [place], [place], [address] Phone: [phone]   Fax:  [phone]  Name: Izaias Krupka MRN: [id number] Date of Birth: Oct 16, 1940

## 2020-04-15 NOTE — Telephone Encounter (Signed)
[place] Controlled Database Checked Last filled: 02/12/2020 (60) LOV w/you: 11/20/2019 Next appt w/you: none

## 2020-04-17 ENCOUNTER — Other Ambulatory Visit: Payer: Self-pay | Admitting: Hematology

## 2020-04-17 DIAGNOSIS — C9000 Multiple myeloma not having achieved remission: Secondary | ICD-10-CM

## 2020-04-17 DIAGNOSIS — R5383 Other fatigue: Secondary | ICD-10-CM

## 2020-04-17 DIAGNOSIS — G629 Polyneuropathy, unspecified: Secondary | ICD-10-CM

## 2020-04-17 NOTE — Progress Notes (Unsigned)
ehb

## 2020-04-25 ENCOUNTER — Other Ambulatory Visit: Payer: Self-pay

## 2020-04-25 ENCOUNTER — Ambulatory Visit (HOSPITAL_COMMUNITY)
Admission: RE | Admit: 2020-04-25 | Discharge: 2020-04-25 | Disposition: A | Discharge status: Home / Self Care | Payer: Medicare Other | Source: Ambulatory Visit | Attending: Cardiology | Admitting: Cardiology

## 2020-04-25 ENCOUNTER — Encounter (HOSPITAL_COMMUNITY): Payer: Self-pay | Admitting: Cardiology

## 2020-04-25 VITALS — BP 120/80 | HR 54 | Wt 172.0 lb

## 2020-04-25 DIAGNOSIS — Z8249 Family history of ischemic heart disease and other diseases of the circulatory system: Secondary | ICD-10-CM | POA: Diagnosis not present

## 2020-04-25 DIAGNOSIS — Z8701 Personal history of pneumonia (recurrent): Secondary | ICD-10-CM | POA: Insufficient documentation

## 2020-04-25 DIAGNOSIS — Z952 Presence of prosthetic heart valve: Secondary | ICD-10-CM | POA: Insufficient documentation

## 2020-04-25 DIAGNOSIS — I2510 Atherosclerotic heart disease of native coronary artery without angina pectoris: Secondary | ICD-10-CM | POA: Insufficient documentation

## 2020-04-25 DIAGNOSIS — I350 Nonrheumatic aortic (valve) stenosis: Secondary | ICD-10-CM | POA: Insufficient documentation

## 2020-04-25 DIAGNOSIS — I159 Secondary hypertension, unspecified: Secondary | ICD-10-CM

## 2020-04-25 DIAGNOSIS — Z8774 Personal history of (corrected) congenital malformations of heart and circulatory system: Secondary | ICD-10-CM | POA: Diagnosis not present

## 2020-04-25 DIAGNOSIS — I493 Ventricular premature depolarization: Secondary | ICD-10-CM | POA: Diagnosis not present

## 2020-04-25 DIAGNOSIS — G629 Polyneuropathy, unspecified: Secondary | ICD-10-CM | POA: Diagnosis not present

## 2020-04-25 DIAGNOSIS — I480 Paroxysmal atrial fibrillation: Secondary | ICD-10-CM | POA: Insufficient documentation

## 2020-04-25 DIAGNOSIS — I5032 Chronic diastolic (congestive) heart failure: Secondary | ICD-10-CM | POA: Diagnosis not present

## 2020-04-25 DIAGNOSIS — Z8579 Personal history of other malignant neoplasms of lymphoid, hematopoietic and related tissues: Secondary | ICD-10-CM | POA: Insufficient documentation

## 2020-04-25 DIAGNOSIS — I4510 Unspecified right bundle-branch block: Secondary | ICD-10-CM | POA: Diagnosis not present

## 2020-04-25 DIAGNOSIS — Z86010 Personal history of colonic polyps: Secondary | ICD-10-CM | POA: Diagnosis not present

## 2020-04-25 DIAGNOSIS — Z7952 Long term (current) use of systemic steroids: Secondary | ICD-10-CM | POA: Insufficient documentation

## 2020-04-25 DIAGNOSIS — I110 Hypertensive heart disease with heart failure: Secondary | ICD-10-CM | POA: Insufficient documentation

## 2020-04-25 DIAGNOSIS — Z791 Long term (current) use of non-steroidal anti-inflammatories (NSAID): Secondary | ICD-10-CM | POA: Diagnosis not present

## 2020-04-25 DIAGNOSIS — E7800 Pure hypercholesterolemia, unspecified: Secondary | ICD-10-CM | POA: Diagnosis not present

## 2020-04-25 DIAGNOSIS — Z888 Allergy status to other drugs, medicaments and biological substances status: Secondary | ICD-10-CM | POA: Diagnosis not present

## 2020-04-25 DIAGNOSIS — R001 Bradycardia, unspecified: Secondary | ICD-10-CM | POA: Insufficient documentation

## 2020-04-25 DIAGNOSIS — M1990 Unspecified osteoarthritis, unspecified site: Secondary | ICD-10-CM | POA: Diagnosis not present

## 2020-04-25 DIAGNOSIS — Z8719 Personal history of other diseases of the digestive system: Secondary | ICD-10-CM | POA: Diagnosis not present

## 2020-04-25 DIAGNOSIS — I10 Essential (primary) hypertension: Secondary | ICD-10-CM | POA: Diagnosis not present

## 2020-04-25 DIAGNOSIS — I38 Endocarditis, valve unspecified: Secondary | ICD-10-CM | POA: Diagnosis not present

## 2020-04-25 DIAGNOSIS — Z79899 Other long term (current) drug therapy: Secondary | ICD-10-CM | POA: Insufficient documentation

## 2020-04-25 LAB — COMPREHENSIVE METABOLIC PANEL
ALT: 58 U/L — ABNORMAL HIGH (ref 0–44)
AST: 41 U/L (ref 15–41)
Albumin: 4.1 g/dL (ref 3.5–5.0)
Alkaline Phosphatase: 55 U/L (ref 38–126)
Anion gap: 8 (ref 5–15)
BUN: 27 mg/dL — ABNORMAL HIGH (ref 8–23)
CO2: 24 mmol/L (ref 22–32)
Calcium: 9.3 mg/dL (ref 8.9–10.3)
Chloride: 109 mmol/L (ref 98–111)
Creatinine, Ser: 1.25 mg/dL — ABNORMAL HIGH (ref 0.61–1.24)
GFR calc Af Amer: >60 mL/min (ref >60)
GFR calc non Af Amer: 54 mL/min — ABNORMAL LOW (ref >60)
Glucose, Bld: 110 mg/dL — ABNORMAL HIGH (ref 70–99)
Potassium: 4.1 mmol/L (ref 3.5–5.1)
Sodium: 141 mmol/L (ref 135–145)
Total Bilirubin: 0.8 mg/dL (ref 0.3–1.2)
Total Protein: 5.7 g/dL — ABNORMAL LOW (ref 6.5–8.1)

## 2020-04-25 LAB — TSH: TSH: 7.975 u[IU]/mL — ABNORMAL HIGH (ref 0.350–4.500)

## 2020-04-25 NOTE — Patient Instructions (Signed)
Call our office in February to schedule your follow up appointment   Labs done today, your results will be available in MyChart, we will contact you for abnormal readings.   If you have any questions or concerns before your next appointment please send [place] a message through [place] or call our office at [phone].    TO LEAVE A MESSAGE FOR THE NURSE SELECT OPTION 2, PLEASE LEAVE A MESSAGE INCLUDING: . YOUR NAME . DATE OF BIRTH . CALL BACK NUMBER . REASON FOR CALL**this is important as we prioritize the call backs  [place] AS LONG AS YOU CALL BEFORE 4:00 PM   At the [facility], you and your health needs are our priority. As part of our continuing mission to provide you with exceptional heart care, we have created designated Provider Care Teams. These Care Teams include your primary Cardiologist (physician) and Advanced Practice Providers (APPs- Physician Assistants and Nurse Practitioners) who all work together to provide you with the care you need, when you need it.   You may see any of the following providers on your designated Care Team at your next follow up: [name] Dr Glori Bickers . Dr [name] . Darrick Grinder, NP . Lyda Jester, PA . Audry Riles, PharmD   Please be sure to bring in all your medications bottles to every appointment.

## 2020-04-27 NOTE — Progress Notes (Signed)
Date:  04/27/2020  ID:  [name] [name], DOB 01-29-1941, MRN [id number]  Provider location: [facility] Type of Visit: Established patient   PCP:  [name], [name], MD  Cardiologist: Dr. Aundra Dubin   History of Present Illness: [name] [name] is a 79 y.o. male who has history of HTN, aortic stenosis and paroxysmal atrial fibrillation. He was hospitalized with atrial fibrillation/RVR in 5/14.  He had TEE-guided cardioversion.  TEE showed bicuspid aortic valve with mild AS and a moderately dilated ascending aorta.  Most echo in 4/17 showed moderate aortic stenosis and MRA chest in 11/17 showed 4.1 cm ascending aorta.   He was on Xarelto for anticoagulation but stopped it as he was convinced it was causing joint pains.  He then refused to start any other anticoagulation at that time.    In 5/17, he had been back in atrial fibrillation for several days and was symptomatic.  I started him on Eliquis and planned TEE-guided DCCV given significant symptoms, but he converted back to NSR on his own.  He continued Eliquis for about 1 month then stopped it on his own.    In 11/17, he was hospitalized with symptomatic atrial fibrillation with RVR.  I started him on diltiazem CD and Eliquis with plan for TEE-guided DCCV.  However, he converted back to NSR on his own. He stopped the diltiazem but has continued the Eliquis.   He was admitted in 2/18 with fever, LUL PNA and left-sided pleural effusion.  Thoracentesis on left was suggestive of parapneumonic effusion.  He was in the hospital 11 days.  During that time, he went into atrial fibrillation with RVR.  He was started on amiodarone and went back into NSR.  Amiodarone was subsequently stopped.   Recurrent atrial fibrillation with RVR in 2/19, felt more fatigued.  He underwent TEE-guided DCCV back to NSR.   He had a lower GI bleed in 11/19 post-polypectomy.  He has since restarted on Eliquis.   Coronary CTA was done in  2/20, this showed mild nonobstructive CAD.  Echo in 2/20 showed EF 60-65%, bicuspid aortic valve with severe AS.   LHC in 6/20 showed no significant coronary disease.  In 8/20, he had TAVR with [place] 3 THV x 2 (valve in valve due to peri-valvular leak initially).  Post-procedure echoes have shown elevated gradient across the aortic valve though dimensionless index has only been in the mildly stenotic range.  Last echo in 9/20 showed mean aortic valve gradient 26 mmHg with dimensionless index 0.54.   Patient has additionally been diagnosed with multiple myeloma.  He was treated with Revlimid, Velcade, and prednisone.   He was admitted early in 1/21 with atrial fibrillation/RVR associated with ischemic colitis.  He underwent TEE-guided DCCV to NSR.   He was readmitted later in 1/21 with PNA.    He fell in 4/21, tripped and fractured his left proximal humerus.  He had ORIF in 5/21.   In 7/21, he had a left inguinal hernia repair.   Patient returns for followup of aortic stenosis and atrial fibrillation.  He is in NSR today.  Still has neuropathic pain in his feet, this is very bothersome and limiting.  He has been seeing a neurologist but has really not had much relief yet.  He is walking with a cane due to poor balance.  No palpitations.  He is not short of breath with exertion but is not very active.  No chest pain.  Per oncology, no evidence for multiple myeloma recurrence.   ECG (personally reviewed): NSR, 1st degree AVB  Labs (5/14): K 3.7, creatinine 0.9, BNP 2261=>109, TSH normal Labs (7/14): K 3.5, creatinine 1.0 Labs (12/14): K 3.8, creatinine 1.0, LDL particle number 1374, LDL 107, TSH normal Labs (6/16): TSH normal, K 3.8, creatinine 0.84, HCT 42.4, LDL 85, LFTs normal Labs (3/17): K 3.8, creatinine 0.87 Labs (5/17): K 4.5, creatinine 0.97, HCT 43.7 Labs (11/17): K 3.2, creatinine 0.81, LDL 71, HDL 24 Labs (2/18): K 3.8, creatinine 0.89 Labs (2/19): K 3.7, creatinine 0.83,  hgb 16 Labs (11/19): LDL 82 Labs (1/20): TSH elevated but free T4 normal, K 4, creatinine 0.85, hgb 13.5 Labs (2/20): TSH mildly elevated but free T4 normal, K 4, creatinine 0.85 Labs (5/20): ESR 48 Labs (9/20): hgb 14.1, plts 38 => 82, K 3.3, creatinine 0.98 => 1.06 Labs (11/20): BNP 126.5 Labs (12/20): hgb 11.9, K 3.2, creatinine 0.87 Labs (1/21): K 4.8, creatinine 1.42, AST 30, ALT 60 Labs (4/21): K 4.5, creatinine 1.22, hgb 12.3, plts 122 Labs (7/21): hgb 13.7, K 4.6, creatinine 1.31, AST 36, ALT 76  Allergies (verified):  No Known Drug Allergies   Past Medical History:  1. Hypertension: ACEI cough.  2. Atrial fibrillation. The patient had new-onset atrial fibrillation in November 2009. He underwent ibutilide cardioversion successfully. He has had 1-2 episodes/year that are short-lived that likely are atrial fibrillation with RVR. He was admitted in 5/14 with atrial fibrillation/RVR and had TEE-guided DCCV.  Atrial fibrillation again in 5/17, converted back to NSR spontaneously.  CHADSVASC score 2.  - Atrial fibrillation 2/19 with TEE-guided DCCV.  - 1/21 TEE-guided DCCV 3. Hypercholesterolemia.  4. Bicuspid aortic valve disorder: Echo (7/11): EF 55-60%, mild LV hypertrophy, mild aortic stenosis with mean gradient 19 mmHg and peak gradient 36 mmHg.  TEE (5/14): EF 55%, mild LVH, bicuspid aortic valve with mild AS (mean gradient 13 mmHg), ascending aorta 4.5 cm.  Echo (9/15) with EF 65-70%, moderate AS (mean gradient 23 mmHg), mild AI, ascending aorta 4.1 cm, mild MR.   - Echo (4/17) with EF 65-70%, moderate aortic stenosis with mean gradient 27 mmHg, PASP 31 mmHg, ascending aorta 4.4 cm.  - Echo (2/18) with EF 55-60%, bicuspid aortic valve with moderate aortic stenosis (underestimated gradient).  - TEE (2/19): EF 60-65%, moderate LVH, bicuspid aortic valve with moderate AS with mean gradient 28 mmHg and AVA 1.2 cm^2, 4.4 cm ascending aorta.  - Echo (2/20): EF 60-65%, mild LVH, bicuspid  aortic valve with severe AS (mean gradient 48 mmHg, AVA 0.8 cm^2).  - TAVR 8/20 with valve in valve [place] 3 THVs (because of peri-valvular leak after initial valve placed).  - Echo (9/20): EF > 65%, mild LVH, mild RV dilation with normal RV systolic function, mean aortic valve gradient 26 mmHg with dimensionless index 0.54, IVC normal.  - TEE (1/21): EF 60-65%, moderate LVH, normal RV, s/p valve-in-valve TAVR with mean gradient 8 mmHg and no PVL.  5. Osteoarthritis.  6. Low back pain.  7. Chest pain: ETT-myoview (12/11) with 10:51 exercise, no chest pain, no significant ST changes, EF 69%, no evidence for ischemia or infarction.  - Coronary CTA (2/20): calcium score 41, nonobstructive CAD.  - LHC (6/20): No significant CAD.  8. Ascending aortic aneurysm: Associated with bicuspid aortic valve.  4.5 cm by TEE in 5/14.   MRA chest (6/14) with bicuspid aortic valve, 4.3 cm ascending aortic aneurysm.  MRA chest (10/15) with 4.2 cm ascending   aorta (bicuspid aortic valve noted).  Echo (4/17) with ascending aorta diameter 4.4 cm.  - MRA chest (11/17) with 4.1 cm ascending aorta.  - Echo (2/19): 4.4 cm ascending aorta.  - Coronary CTA in 2/20 showed 4.3 cm ascending aorta.  9. Colonic polyps: GI bleeding in 11/19 s/p polypectomy.  10. Multiple myeloma.  11. Bradycardia 12. Thrombocytopenia: likely related to chemotherapy for multiple myeloma.  13. PVCs: Zio monitor in 10/20 showed 9.4% PVCs.  14. Chronic diastolic CHF: RHC (12/20) with mean RA 3, PA 22/2, mean PCWP 6, CI 2.74 15. Left inguinal hernia: s/p repair.   Current Outpatient Medications  Medication Sig Dispense Refill  . acyclovir (ZOVIRAX) 400 MG tablet Take 1 tablet (400 mg total) by mouth 2 (two) times daily. 180 tablet 3  . amiodarone (PACERONE) 200 MG tablet Take 200 mg by mouth daily.    . amLODipine (NORVASC) 5 MG tablet Take 1 tablet (5 mg total) by mouth daily. 90 tablet 3  . apixaban (ELIQUIS) 5 MG TABS tablet Take 5 mg  by mouth 2 (two) times daily.    . b complex vitamins tablet Take 1 tablet by mouth daily. 100 tablet 3  . Calcium-Magnesium 500-250 MG TABS Take 1 tablet by mouth daily.    . Carboxymethylcellul-Glycerin (LUBRICATING EYE DROPS OP) Place 1 drop into both eyes daily as needed (dry eyes).    . Cholecalciferol (VITAMIN D) 50 MCG (2000 UT) tablet Take 2,000 Units by mouth daily.    . dexamethasone (DECADRON) 4 MG tablet Take 12 mg by mouth once a week. On tuesdays    . diclofenac Sodium (VOLTAREN) 1 % GEL Apply 1 application topically 2 (two) times daily as needed (pain.).    . DULoxetine (CYMBALTA) 60 MG capsule TAKE 1 CAPSULE BY MOUTH EVERY DAY 90 capsule 1  . ergocalciferol (VITAMIN D2) 1.25 MG (50000 UT) capsule Take 50,000 Units by mouth every Friday. In the morning    . famotidine (PEPCID) 40 MG tablet Take 40 mg by mouth daily.    . furosemide (LASIX) 20 MG tablet Take 20 mg by mouth every other day.    . gabapentin (NEURONTIN) 300 MG capsule Take 1 capsule (300 mg total) by mouth 3 (three) times daily.    . loratadine (CLARITIN) 10 MG tablet Take 10 mg by mouth daily.    . LORazepam (ATIVAN) 0.5 MG tablet TAKE 1 TABLET (0.5 MG TOTAL) BY MOUTH EVERY 8 (EIGHT) HOURS AS NEEDED (NAUSEA OR VOMITING). 60 tablet 1  . metoprolol succinate (TOPROL-XL) 25 MG 24 hr tablet Take 25 mg by mouth 2 (two) times daily.    . oxyCODONE (OXY IR/ROXICODONE) 5 MG immediate release tablet Take 2 tablets (10 mg total) by mouth every 4 (four) hours as needed for moderate pain or severe pain. 40 tablet 0  . polyethylene glycol powder (GLYCOLAX/MIRALAX) 17 GM/SCOOP powder Take 17-34 g by mouth 2 (two) times daily as needed for moderate constipation. 500 g 5  . senna-docusate (SENOKOT-S) 8.6-50 MG tablet Take 2 tablets by mouth at bedtime.    . spironolactone (ALDACTONE) 25 MG tablet Take 1 tablet (25 mg total) by mouth daily. 30 tablet 3   No current facility-administered medications for this encounter.    Allergies:    Xarelto [rivaroxaban], Ambien [zolpidem], Corticosteroids, Ramipril, and Benazepril   Social History:  The patient  reports that he has never smoked. He has never used smokeless tobacco. He reports previous alcohol use. He reports that he does not   use drugs.   Family History:  The patient's family history includes Colon cancer (age of onset: 80) in his mother; Hypertension in an other family member.   ROS:  Please see the history of present illness.   All other systems are personally reviewed and negative.   Exam:   BP 120/80   Pulse (!) 54   Wt 78 kg (172 lb)   SpO2 98%   BMI 24.68 kg/m  General: NAD Neck: No JVD, no thyromegaly or thyroid nodule.  Lungs: Clear to auscultation bilaterally with normal respiratory effort. CV: Nondisplaced PMI.  Heart regular S1/S2, no S3/S4, 1/6 SEM RUSB.  1+ ankle edema.  No carotid bruit.  Normal pedal pulses.  Abdomen: Soft, nontender, no hepatosplenomegaly, no distention.  Skin: Intact without lesions or rashes.  Neurologic: Alert and oriented x 3.  Psych: Normal affect. Extremities: No clubbing or cyanosis.  HEENT: Normal.   Recent Labs: 07/30/2019: B Natriuretic Peptide 126.5 10/02/2019: Magnesium 2.5 04/08/2020: Hemoglobin 13.3; Platelets 130 04/25/2020: ALT 58; BUN 27; Creatinine, Ser 1.25; Potassium 4.1; Sodium 141; TSH 7.975  Personally reviewed   Wt Readings from Last 3 Encounters:  04/25/20 78 kg (172 lb)  04/08/20 75.6 kg (166 lb 11.2 oz)  03/18/20 75.8 kg (167 lb)    ASSESSMENT AND PLAN:  1. Atrial fibrillation: Paroxysmal. He is quite symptomatic when in atrial fibrillation.  CHADSVASC = 3.  He is in NSR today.   - Continue Eliquis.  - He has not wanted atrial fibrillation ablation.   - Continue amiodarone.  Check CMET and TSH today (ALT has been mildly elevated). He will need a regular eye exam while on amiodarone.   2. Peripheral neuropathy: Patient developed a very painful peripheral neuropathy on Velcade.  This is still  very symptomatic.  3. Bradycardia: After TAVR, patient had RBBB and LAFB with HR down to the 40s.  RBBB has resolved and HR is higher.  4. PVCs: Zio monitor in 10/20 showed 9.4% PVCs.  Toprol XL was increased to 25 mg bid, he is not feeling palpitations.   5. HTN: BP controlled.     - Continue amlodipine 5 mg daily.     - Continue spironolactone 25 mg daily.  6. Bicuspid aortic valve disorder: Ascending aorta was 4.3 cm on recent coronary CTA. He is now s/p TAVR with 2 [place] THVs (valve-in-valve due to peri-valvular leak after 1st valve placed). Post-op, mean gradient across the aortic valve was elevated, 26 mmHg in 9/20.  Dimensionless index, in 9/20 0.54, only suggests mild bioprosthetic aortic stenosis.  Echo in 1/21 showed mean gradient down to 10 mmHg and TEE showed mean gradient 8 mmHg with no peri-valvular leakage.  7. Chronic diastolic CHF: He is no longer taking Lasix.  He does not look volume overloaded.   8. Multiple myeloma: He is off Revlimid, prednisone, and Velcade.  He appears to have had a good response.    9. Deconditioning: Encouraged increased activity.   Followup in 3 months.   Signed,  , MD  04/27/2020  [facility] [address] [facility] [place] [address] [phone] (office) [phone] (fax) 

## 2020-04-28 ENCOUNTER — Telehealth (HOSPITAL_COMMUNITY): Payer: Self-pay

## 2020-04-28 DIAGNOSIS — R7989 Other specified abnormal findings of blood chemistry: Secondary | ICD-10-CM

## 2020-04-28 DIAGNOSIS — I38 Endocarditis, valve unspecified: Secondary | ICD-10-CM

## 2020-04-28 NOTE — Telephone Encounter (Signed)
Patient advised and verbalized understanding.pt would like to make appointment at a later date. Will call at a later date. Lab orders entered.   Orders Placed This Encounter  Procedures  . Hepatic function panel    Standing Status:   Future    Standing Expiration Date:   04/28/2021    Order Specific Question:   Release to patient    Answer:   Immediate  . TSH    Standing Status:   Future    Standing Expiration Date:   04/28/2021    Order Specific Question:   Release to patient    Answer:   Immediate  . T4, free    Standing Status:   Future    Standing Expiration Date:   04/28/2021    Order Specific Question:   Release to patient    Answer:   Immediate  . T3, free    Standing Status:   Future    Standing Expiration Date:   04/28/2021    Order Specific Question:   Release to patient    Answer:   Immediate

## 2020-04-28 NOTE — Telephone Encounter (Signed)
-----   Message from Larey Dresser, MD sent at 04/25/2020  5:05 PM EDT ----- No changes, ALT trending down.  Would repeat LFTs again in 1 month, also repeat TSH, free T3, free T4.

## 2020-05-05 ENCOUNTER — Other Ambulatory Visit: Payer: Self-pay

## 2020-05-05 ENCOUNTER — Ambulatory Visit: Payer: Medicare Other | Admitting: Physical Therapy

## 2020-05-05 ENCOUNTER — Other Ambulatory Visit: Payer: Self-pay | Admitting: Physician Assistant

## 2020-05-05 DIAGNOSIS — I890 Lymphedema, not elsewhere classified: Secondary | ICD-10-CM

## 2020-05-05 DIAGNOSIS — R262 Difficulty in walking, not elsewhere classified: Secondary | ICD-10-CM | POA: Diagnosis not present

## 2020-05-05 DIAGNOSIS — R293 Abnormal posture: Secondary | ICD-10-CM | POA: Diagnosis not present

## 2020-05-05 DIAGNOSIS — R296 Repeated falls: Secondary | ICD-10-CM | POA: Diagnosis not present

## 2020-05-05 DIAGNOSIS — M6281 Muscle weakness (generalized): Secondary | ICD-10-CM | POA: Diagnosis not present

## 2020-05-05 NOTE — Therapy (Signed)
[place], [place], [address] Phone: [phone]   Fax:  [phone]  Physical Therapy Treatment  Patient Details  Name: [name] [name] MRN: [id number] Date of Birth: June 30, 1941 Referring Provider (PT): Dr. Irene Limbo    Encounter Date: 05/05/2020   PT End of Session - 05/05/20 1031    Visit Number 4    Number of Visits 20    Date for PT Re-Evaluation 06/03/20    PT Start Time 0900    PT Stop Time 0950    PT Time Calculation (min) 50 min    Activity Tolerance Patient tolerated treatment well    Behavior During Therapy [facility] for tasks assessed/performed           Past Medical History:  Diagnosis Date  . Ascending aortic aneurysm ([place])   . Bicuspid aortic valve   . Cancer ([name] [name])   . CHF NYHA class I (no symptoms from ordinary activities), acute, diastolic ([name])   . Dysrhythmia 2009   A fib  . Fatty liver    mild  . Fracture    left proximal humerus  . GERD (gastroesophageal reflux disease)   . GI bleeding 07/21/2018   post polypectomy  . Hemorrhoids   . HTN (hypertension)   . Hypercholesteremia   . Hypokalemia   . Internal hemorrhoids   . LBP (low back pain)   . Moderate aortic stenosis   . Osteoarthritis   . Paroxysmal atrial fibrillation ([name] [name])    a. new onset Afib in 07/2008. He underwent ibutilide cardioversion successfully. b. Recurrence 01/2013 s/p TEE/DCCV - was on Xarelto but he stopped it as he was convinced it was causing joint pn. c. Recurrence 01/2016 - spont conv to NSR. Pt took Eliquis x1 mo then declined further anticoag. d. Recurrence 07/2016.  [name] [name] Pneumonia   . Tubular adenoma of colon     Past Surgical History:  Procedure Laterality Date  . BACK SURGERY  x12 years ago  . CARDIAC CATHETERIZATION  2020  . CARDIAC VALVE REPLACEMENT  2020  . CARDIOVERSION N/A 01/26/2013   Procedure: CARDIOVERSION;  Surgeon: Larey Dresser, MD;  Location: [facility] ENDOSCOPY;  Service: Cardiovascular;   Laterality: N/A;  . CARDIOVERSION N/A 10/28/2017   Procedure: CARDIOVERSION;  Surgeon: Larey Dresser, MD;  Location: [facility] ENDOSCOPY;  Service: Cardiovascular;  Laterality: N/A;  . CARDIOVERSION N/A 03/03/2018   Procedure: CARDIOVERSION;  Surgeon: Lelon Perla, MD;  Location: [facility] ENDOSCOPY;  Service: Cardiovascular;  Laterality: N/A;  . CARDIOVERSION N/A 09/19/2019   Procedure: CARDIOVERSION;  Surgeon: Larey Dresser, MD;  Location: [facility] ENDOSCOPY;  Service: Cardiovascular;  Laterality: N/A;  . COLONOSCOPY    . COLONOSCOPY  07/17/2018   at [facility]  . HEMORRHOID SURGERY    . INGUINAL HERNIA REPAIR Left 03/18/2020   Procedure: OPEN LEFT INGUINAL HERNIA REPAIR;  Surgeon: Alphonsa Overall, MD;  Location: [place];  Service: General;  Laterality: Left;  . LUMBAR LAMINECTOMY    . ORIF HUMERUS FRACTURE Left 01/15/2020   Procedure: OPEN REDUCTION INTERNAL FIXATION (ORIF) PROXIMAL HUMERUS FRACTURE;  Surgeon: Nicholes Stairs, MD;  Location: [place];  Service: Orthopedics;  Laterality: Left;  . POLYPECTOMY    . RIGHT HEART CATH N/A 08/20/2019   Procedure: RIGHT HEART CATH;  Surgeon: Larey Dresser, MD;  Location: [place] CV LAB;  Service: Cardiovascular;  Laterality: N/A;  . RIGHT/LEFT HEART CATH AND CORONARY ANGIOGRAPHY N/A 03/07/2019   Procedure: RIGHT/LEFT HEART CATH AND CORONARY ANGIOGRAPHY;  Surgeon: Burnell Blanks, MD;  Location: [place] CV LAB;  Service: Cardiovascular;  Laterality: N/A;  . [name]  04/2019  . TEE WITHOUT CARDIOVERSION N/A 01/26/2013   Procedure: TRANSESOPHAGEAL ECHOCARDIOGRAM (TEE);  Surgeon: Larey Dresser, MD;  Location: [place];  Service: Cardiovascular;  Laterality: N/A;  . TEE WITHOUT CARDIOVERSION N/A 10/28/2017   Procedure: TRANSESOPHAGEAL ECHOCARDIOGRAM (TEE);  Surgeon: Larey Dresser, MD;  Location: [facility] ENDOSCOPY;  Service: Cardiovascular;  Laterality: N/A;  . TEE WITHOUT CARDIOVERSION N/A 05/08/2019   Procedure: TRANSESOPHAGEAL ECHOCARDIOGRAM (TEE);   Surgeon: Burnell Blanks, MD;  Location: [place] CV LAB;  Service: Open Heart Surgery;  Laterality: N/A;  . TEE WITHOUT CARDIOVERSION N/A 09/19/2019   Procedure: TRANSESOPHAGEAL ECHOCARDIOGRAM (TEE);  Surgeon: Larey Dresser, MD;  Location: [facility] ENDOSCOPY;  Service: Cardiovascular;  Laterality: N/A;  . TRANSCATHETER AORTIC VALVE REPLACEMENT, TRANSFEMORAL N/A 05/08/2019   Procedure: TRANSCATHETER AORTIC VALVE REPLACEMENT, TRANSFEMORAL;  Surgeon: Burnell Blanks, MD;  Location: [place] CV LAB;  Service: Open Heart Surgery;  Laterality: N/A;    There were no vitals filed for this visit.   Subjective Assessment - 05/05/20 1003    Subjective Pt complains of pain and swelling in his legs that is keeping him up at night.  He also says that after riding his stationary bike  his legs hurt very badly    Pertinent History During preliminary work up for valve replacement in July of 2020 it was discovered that pt had large lytic lesions in his pelvis due to  multiple myeloma.  He had a TAVR in August 2021  and started chemotherapy for multiple myeloma which was successful in reducing the lestions. However,  he developed peripheral neuropathy and chemotherapy is being adjusted. He has multiple hospitalizations in dec-jan 2021 for septic colitis with acute kidney injury and pneumonia with associated deconditioning and difficutly walking due to Chemotherapy induced peripheral neuropathy.  Pt  had home health PT and continues to walk with a cane. Past history also significant for afib and back surgery.  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    Patient Stated Goals To get relief with pain in feet and decrease swelling, get more stable  and walk without the came    Currently in Pain? Yes    Pain Score --   did not rate                            [facility] Adult PT Treatment/Exercise - 05/05/20 0001      Manual Therapy   Manual Therapy Manual Lymphatic Drainage  (MLD);Compression Bandaging;Passive ROM    Manual therapy comments gave pt written instruction for manual lymph drainage and compression bandaging and she actively observed session today     Manual Lymphatic Drainage (MLD) in supine with leg up on bolster with several pillows and instructing as I went for short neck, diaphragmatic breaths, right axillary nodes, right inguinal -axillo anastamosis, left inguinal nodes, right thigh, knee to ankle with return along pathways.  Extra time spend on boggy fullness at ankle that seemed to resolve with treatment     Compression Bandaging thick stockinetter, rosidol soft foam, 8. and 2 -10 cm bandages from foot to knee. ( no wrap on toes0     Passive ROM to right ankle and foot.  Pt with very high arch in foor  PT Short Term Goals - 04/15/20 1925      PT SHORT TERM GOAL #1   Title Pt and wife feel that they are able to manage pt feet and ankle swelling at home    Time 4    Period Weeks    Status New      PT SHORT TERM GOAL #2   Title Pt will increase # of reps of sit to stand to 10 reps in 30 seconds indicating an improvment in functional strength    Baseline 5 reps in 30 seconds on 12/31/2019    Time 4    Period Weeks    Status Revised      PT SHORT TERM GOAL #3   Title Pt will decrease normal TUG score to < 14 seconds indicating an improvmemnt in functional mobility    Baseline 25.35 sec on 12/31/2019, 16.47 sec on 04/15/20    Time 4    Period Weeks    Status Revised             PT Long Term Goals - 04/15/20 1927      PT LONG TERM GOAL #1   Title Pt will report the pain in his legs is decreased to 4/10    Baseline 6/10 on eval, 5/10 at 04/15/2020    Time 8    Period Weeks    Status On-going      PT LONG TERM GOAL #2   Title Pt will report his tiredness with walking is decreased by 50%    Time 8    Period Weeks    Status On-going      PT LONG TERM GOAL #3   Title Pt will increase # of reps of sit to  stand to 10 reps in 30 seconds indicating an improvment in functional strength    Baseline 5 reps in 30 secons on 12/31/2019, 8 reps on 04/15/2020    Time 8    Period Weeks    Status Revised      PT LONG TERM GOAL #4   Title Pt will decrease normal TUG score to < 12  seconds indicating an improvmemnt in functional mobility    Baseline 25.35 on 12/31/2019, 16.46. on 04/15/2020    Time 8    Period Weeks    Status Revised      PT LONG TERM GOAL #5   Title Pt will increae BERG balancer score to > 46 for moderate risk for fall    Baseline 01/07/20 score is 36 and < 36 is high risk for falls if walking without at device ( close to 100%), 04/15/2020 score is 41    Time 8    Period Weeks    Status On-going      PT LONG TERM GOAL #6   Title Pt will be able to do a floor to stand transfer with min assist    Time 8    Period Weeks    Status On-going                 Plan - 05/05/20 1032    Clinical Impression Statement Treatment today focused on edema managment of right leg.  Pt had reduction of boggy swelling with elevation, and MLD and received PROM to ankle.  He had much better active movement in foot after session  Initial session of wrapping with wife paying close attention. Pt will try to keep it on til tomorrow    Personal Factors and Comorbidities Age;Comorbidity  3+;Fitness;Past/Current Experience    Comorbidities a fib, recent sepsis, kidney injury and pneumonia, ongoing chemo, past back surgery    Stability/Clinical Decision Making Evolving/Moderate complexity    Rehab Potential Good    PT Frequency 2x / week    PT Duration 8 weeks    PT Treatment/Interventions ADLs/Self Care Home Management;DME Instruction;Gait training;Stair training;Balance training;Therapeutic exercise;Therapeutic activities;Functional mobility training;Neuromuscular re-education;Cognitive remediation;Patient/family education;Manual lymph drainage;Orthotic Fit/Training;Manual techniques;Compression  bandaging;Passive range of motion;Taping    PT Next Visit Plan Assess effectiveness of MLD and compression bandaging Measure feet.  Teach wife MLD and bandaging to both feet to decrease edemal Gait training  change direction, change speeds, direction, etc, stair training.  Eventually do floor to stand transfers  Progress to UE and LE strengthening, then standing work for balance and strength and advanced gait. consider inteval training for strengthening    Consulted and Agree with Plan of Care Patient           Patient will benefit from skilled therapeutic intervention in order to improve the following deficits and impairments:  Abnormal gait, Decreased balance, Decreased endurance, Decreased mobility, Difficulty walking, Impaired sensation, Impaired perceived functional ability, Increased edema, Decreased knowledge of precautions, Decreased activity tolerance, Decreased strength, Pain, Postural dysfunction  Visit Diagnosis: Difficulty in walking, not elsewhere classified  Muscle weakness (generalized)  Abnormal posture  Repeated falls  Lymphedema, not elsewhere classified     Problem List Patient Active Problem List   Diagnosis Date Noted  . Closed fracture of left proximal humerus 01/15/2020  . Acute cystitis without hematuria 11/07/2019  . Left inguinal hernia 11/07/2019  . Physical deconditioning   . Depression with anxiety   . Acute respiratory failure with hypoxia ([place])   . Ischemic colitis ([place]) 09/18/2019  . Pressure injury of skin 09/16/2019  . Bloody diarrhea 09/14/2019  . Abdominal pain, diffuse 09/14/2019  . Acute metabolic encephalopathy 16/06/9603  . Goals of care, counseling/discussion 09/14/2019  . DNR (do not resuscitate) 09/14/2019  . Peripheral neuropathy 09/13/2019  . Gait disorder 09/13/2019  . Multiple myeloma not having achieved remission ([place]) 05/17/2019  . Counseling regarding advance care planning and goals of care 05/17/2019  . Bone metastases  ([place]) 05/17/2019  . Postoperative fever 05/09/2019  . S/P TAVR (transcatheter aortic valve replacement) 05/08/2019  . Multiple myeloma ([place]) 05/08/2019  . Acute on chronic diastolic heart failure ([place]) 05/08/2019  . Severe aortic stenosis   . Polymyalgia rheumatica syndrome ([place]) 01/29/2019  . CAD (coronary artery disease) 08/01/2018  . Hematochezia 07/27/2018  . HTN (hypertension) 02/28/2018  . Hypercholesteremia 02/28/2018  . Degeneration of lumbar intervertebral disc 12/06/2017  . Adenomatous polyp of ascending colon 12/02/2017  . Chronic low back pain 09/26/2017  . Mastoiditis 03/21/2017  . Abnormal TSH 12/23/2016  . PNA (pneumonia)   . AKI (acute kidney injury) ([place])   . Ascending aortic aneurysm ([place]) 02/15/2013  . Atrial fibrillation with RVR ([place]) 01/23/2013  . Well adult exam 05/31/2012  . PAF (paroxysmal atrial fibrillation) ([place]) 08/05/2008  . HLD (hyperlipidemia) 04/08/2007  . Essential hypertension 04/08/2007   Donato Heinz. Owens Shark PT  Norwood Levo 05/05/2020, 10:36 AM  [place], [place], [address] Phone: [phone]   Fax:  [phone]  Name: Aleksa Catterton MRN: [id number] Date of Birth: 02/10/1941

## 2020-05-06 ENCOUNTER — Ambulatory Visit: Payer: Medicare Other | Admitting: Physical Therapy

## 2020-05-06 ENCOUNTER — Encounter: Payer: Self-pay | Admitting: Physical Therapy

## 2020-05-06 DIAGNOSIS — I890 Lymphedema, not elsewhere classified: Secondary | ICD-10-CM

## 2020-05-06 DIAGNOSIS — R293 Abnormal posture: Secondary | ICD-10-CM

## 2020-05-06 DIAGNOSIS — M6281 Muscle weakness (generalized): Secondary | ICD-10-CM | POA: Diagnosis not present

## 2020-05-06 DIAGNOSIS — R262 Difficulty in walking, not elsewhere classified: Secondary | ICD-10-CM | POA: Diagnosis not present

## 2020-05-06 DIAGNOSIS — R296 Repeated falls: Secondary | ICD-10-CM | POA: Diagnosis not present

## 2020-05-06 NOTE — Telephone Encounter (Signed)
This is a CHF pt 

## 2020-05-06 NOTE — Therapy (Signed)
[place], [place], [address] Phone: [phone]   Fax:  [phone]  Physical Therapy Treatment  Patient Details  Name: [name] [name] MRN: [id number] Date of Birth: [birth year] Referring Provider (PT): Dr. Irene Limbo    Encounter Date: 05/06/2020   PT End of Session - 05/06/20 1729    Visit Number 5    Number of Visits 20    Date for PT Re-Evaluation 06/03/20    PT Start Time 1400    PT Stop Time 1455    PT Time Calculation (min) 55 min    Activity Tolerance Patient tolerated treatment well    Behavior During Therapy [facility] for tasks assessed/performed           Past Medical History:  Diagnosis Date  . Ascending aortic aneurysm ([place])   . Bicuspid aortic valve   . Cancer ([place])   . CHF NYHA class I (no symptoms from ordinary activities), acute, diastolic ([place])   . Dysrhythmia 2009   A fib  . Fatty liver    mild  . Fracture    left proximal humerus  . GERD (gastroesophageal reflux disease)   . GI bleeding 07/21/2018   post polypectomy  . Hemorrhoids   . HTN (hypertension)   . Hypercholesteremia   . Hypokalemia   . Internal hemorrhoids   . LBP (low back pain)   . Moderate aortic stenosis   . Osteoarthritis   . Paroxysmal atrial fibrillation ([place])    a. new onset Afib in 07/2008. He underwent ibutilide cardioversion successfully. b. Recurrence 01/2013 s/p TEE/DCCV - was on Xarelto but he stopped it as he was convinced it was causing joint pn. c. Recurrence 01/2016 - spont conv to NSR. Pt took Eliquis x1 mo then declined further anticoag. d. Recurrence 07/2016.  [name] Pneumonia   . Tubular adenoma of colon     Past Surgical History:  Procedure Laterality Date  . BACK SURGERY  x12 years ago  . CARDIAC CATHETERIZATION  2020  . CARDIAC VALVE REPLACEMENT  2020  . CARDIOVERSION N/A 01/26/2013   Procedure: CARDIOVERSION;  Surgeon: Larey Dresser, MD;  Location: [facility] ENDOSCOPY;  Service: Cardiovascular;   Laterality: N/A;  . CARDIOVERSION N/A 10/28/2017   Procedure: CARDIOVERSION;  Surgeon: Larey Dresser, MD;  Location: [facility] ENDOSCOPY;  Service: Cardiovascular;  Laterality: N/A;  . CARDIOVERSION N/A 03/03/2018   Procedure: CARDIOVERSION;  Surgeon: Lelon Perla, MD;  Location: [facility] ENDOSCOPY;  Service: Cardiovascular;  Laterality: N/A;  . CARDIOVERSION N/A 09/19/2019   Procedure: CARDIOVERSION;  Surgeon: Larey Dresser, MD;  Location: [facility] ENDOSCOPY;  Service: Cardiovascular;  Laterality: N/A;  . COLONOSCOPY    . COLONOSCOPY  07/17/2018   at [facility]  . HEMORRHOID SURGERY    . INGUINAL HERNIA REPAIR Left 03/18/2020   Procedure: OPEN LEFT INGUINAL HERNIA REPAIR;  Surgeon: Alphonsa Overall, MD;  Location: [place];  Service: General;  Laterality: Left;  . LUMBAR LAMINECTOMY    . ORIF HUMERUS FRACTURE Left 01/15/2020   Procedure: OPEN REDUCTION INTERNAL FIXATION (ORIF) PROXIMAL HUMERUS FRACTURE;  Surgeon: Nicholes Stairs, MD;  Location: [place];  Service: Orthopedics;  Laterality: Left;  . POLYPECTOMY    . RIGHT HEART CATH N/A 08/20/2019   Procedure: RIGHT HEART CATH;  Surgeon: Larey Dresser, MD;  Location: [place] CV LAB;  Service: Cardiovascular;  Laterality: N/A;  . RIGHT/LEFT HEART CATH AND CORONARY ANGIOGRAPHY N/A 03/07/2019   Procedure: RIGHT/LEFT HEART CATH AND CORONARY ANGIOGRAPHY;  Surgeon: Burnell Blanks, MD;  Location: [place] CV LAB;  Service: Cardiovascular;  Laterality: N/A;  . [name]  04/2019  . TEE WITHOUT CARDIOVERSION N/A 01/26/2013   Procedure: TRANSESOPHAGEAL ECHOCARDIOGRAM (TEE);  Surgeon: Larey Dresser, MD;  Location: [place];  Service: Cardiovascular;  Laterality: N/A;  . TEE WITHOUT CARDIOVERSION N/A 10/28/2017   Procedure: TRANSESOPHAGEAL ECHOCARDIOGRAM (TEE);  Surgeon: Larey Dresser, MD;  Location: [facility] ENDOSCOPY;  Service: Cardiovascular;  Laterality: N/A;  . TEE WITHOUT CARDIOVERSION N/A 05/08/2019   Procedure: TRANSESOPHAGEAL ECHOCARDIOGRAM (TEE);   Surgeon: Burnell Blanks, MD;  Location: [place] CV LAB;  Service: Open Heart Surgery;  Laterality: N/A;  . TEE WITHOUT CARDIOVERSION N/A 09/19/2019   Procedure: TRANSESOPHAGEAL ECHOCARDIOGRAM (TEE);  Surgeon: Larey Dresser, MD;  Location: [facility] ENDOSCOPY;  Service: Cardiovascular;  Laterality: N/A;  . TRANSCATHETER AORTIC VALVE REPLACEMENT, TRANSFEMORAL N/A 05/08/2019   Procedure: TRANSCATHETER AORTIC VALVE REPLACEMENT, TRANSFEMORAL;  Surgeon: Burnell Blanks, MD;  Location: [place] CV LAB;  Service: Open Heart Surgery;  Laterality: N/A;    There were no vitals filed for this visit.   Subjective Assessment - 05/06/20 1722    Subjective Pt comes in wearing compression bandaging on his right leg.  He said that he didn't have as much pain in his leg as he usually does    Pertinent History During preliminary work up for valve replacement in July of 2020 it was discovered that pt had large lytic lesions in his pelvis due to  multiple myeloma.  He had a TAVR in August 2021  and started chemotherapy for multiple myeloma which was successful in reducing the lestions. However,  he developed peripheral neuropathy and chemotherapy is being adjusted. He has multiple hospitalizations in dec-jan 2021 for septic colitis with acute kidney injury and pneumonia with associated deconditioning and difficutly walking due to Chemotherapy induced peripheral neuropathy.  Pt  had home health PT and continues to walk with a cane. Past history also significant for afib and back surgery.  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    Patient Stated Goals To get relief with pain in feet and decrease swelling, get more stable  and walk without the came    Currently in Pain? No/denies                 LYMPHEDEMA/ONCOLOGY QUESTIONNAIRE - 05/06/20 0001      Right Lower Extremity Lymphedema   At Midpatella/Popliteal Crease 36.5 cm    30 cm Proximal to Floor at Lateral Plantar Foot 31.2 cm     20 cm Proximal to Floor at Lateral Plantar Foot 25.$RemoveBeforeD'6 1    10 'IRamxgrIAlpQXj$ cm Proximal to Floor at Lateral Malleoli 25.7 cm    5 cm Proximal to 1st MTP Joint 25.5 cm    Across MTP Joint 24 cm    Around Proximal Great Toe 9 cm                      [facility] Adult PT Treatment/Exercise - 05/06/20 0001      Exercises   Exercises Shoulder;Lumbar;Knee/Hip;Other Exercises    Other Exercises  gave pt information from [facility] about tai chi exericse       Lumbar Exercises: Supine   Bridge 10 reps      Lumbar Exercises: Sidelying   Clam Both;10 reps    Hip Abduction Both;10 reps      Knee/Hip Exercises: Seated   Marching Strengthening;Right;Left;10  reps    Abduction/Adduction  Strengthening;Both;10 reps   with green theraband      Knee/Hip Exercises: Supine   Straight Leg Raises Strengthening;Both;10 reps    Other Supine Knee/Hip Exercises manual insometric add/abd    Other Supine Knee/Hip Exercises ankle dorsi and plantar flexion with green theraband       Shoulder Exercises: Supine   Protraction Strengthening;Both;10 reps    Flexion Strengthening;Left;10 reps;Weights    Shoulder Flexion Weight (lbs) 3    Other Supine Exercises skull crushers 10 reps with 3 pounds     Other Supine Exercises bicep curls 10 reps with 3 pounds       Manual Therapy   Manual Therapy Edema management    Edema Management remeasuared right leg. No visible swelling in right leg once bandaged removed except for some swelling in toes which were not wrapped.  This swelling decreased once bandage removed                     PT Short Term Goals - 04/15/20 1925      PT SHORT TERM GOAL #1   Title Pt and wife feel that they are able to manage pt feet and ankle swelling at home    Time 4    Period Weeks    Status New      PT SHORT TERM GOAL #2   Title Pt will increase # of reps of sit to stand to 10 reps in 30 seconds indicating an improvment in functional strength    Baseline 5 reps in 30  seconds on 12/31/2019    Time 4    Period Weeks    Status Revised      PT SHORT TERM GOAL #3   Title Pt will decrease normal TUG score to < 14 seconds indicating an improvmemnt in functional mobility    Baseline 25.35 sec on 12/31/2019, 16.47 sec on 04/15/20    Time 4    Period Weeks    Status Revised             PT Long Term Goals - 04/15/20 1927      PT LONG TERM GOAL #1   Title Pt will report the pain in his legs is decreased to 4/10    Baseline 6/10 on eval, 5/10 at 04/15/2020    Time 8    Period Weeks    Status On-going      PT LONG TERM GOAL #2   Title Pt will report his tiredness with walking is decreased by 50%    Time 8    Period Weeks    Status On-going      PT LONG TERM GOAL #3   Title Pt will increase # of reps of sit to stand to 10 reps in 30 seconds indicating an improvment in functional strength    Baseline 5 reps in 30 secons on 12/31/2019, 8 reps on 04/15/2020    Time 8    Period Weeks    Status Revised      PT LONG TERM GOAL #4   Title Pt will decrease normal TUG score to < 12  seconds indicating an improvmemnt in functional mobility    Baseline 25.35 on 12/31/2019, 16.46. on 04/15/2020    Time 8    Period Weeks    Status Revised      PT LONG TERM GOAL #5   Title Pt will increae BERG balancer score to > 46 for moderate risk for  fall    Baseline 01/07/20 score is 36 and < 36 is high risk for falls if walking without at device ( close to 100%), 04/15/2020 score is 41    Time 8    Period Weeks    Status On-going      PT LONG TERM GOAL #6   Title Pt will be able to do a floor to stand transfer with min assist    Time 8    Period Weeks    Status On-going                 Plan - 05/06/20 1729    Clinical Impression Statement Pt had good results from compression bandaging with all visible swelling gone from right foot and pt feeling much better. He wanted to work on exercise today and wife will practice compression bandaging on left foot next session      Personal Factors and Comorbidities Age;Comorbidity 3+;Fitness;Past/Current Experience    Comorbidities a fib, recent sepsis, kidney injury and pneumonia, ongoing chemo, past back surgery    Stability/Clinical Decision Making Evolving/Moderate complexity    Rehab Potential Good    PT Frequency 2x / week    PT Duration 8 weeks    PT Treatment/Interventions ADLs/Self Care Home Management;DME Instruction;Gait training;Stair training;Balance training;Therapeutic exercise;Therapeutic activities;Functional mobility training;Neuromuscular re-education;Cognitive remediation;Patient/family education;Manual lymph drainage;Orthotic Fit/Training;Manual techniques;Compression bandaging;Passive range of motion;Taping    PT Next Visit Plan teach wife MLD and compression bandaging Measure feet.  Teach wife MLD and bandaging to both feet to decrease edemal Gait training  change direction, change speeds, direction, etc, stair training.  Eventually do floor to stand transfers  Progress to UE and LE strengthening, then standing work for balance and strength and advanced gait. consider inteval training for strengthening           Patient will benefit from skilled therapeutic intervention in order to improve the following deficits and impairments:  Abnormal gait, Decreased balance, Decreased endurance, Decreased mobility, Difficulty walking, Impaired sensation, Impaired perceived functional ability, Increased edema, Decreased knowledge of precautions, Decreased activity tolerance, Decreased strength, Pain, Postural dysfunction  Visit Diagnosis: Difficulty in walking, not elsewhere classified  Muscle weakness (generalized)  Abnormal posture  Repeated falls  Lymphedema, not elsewhere classified     Problem List Patient Active Problem List   Diagnosis Date Noted  . Closed fracture of left proximal humerus 01/15/2020  . Acute cystitis without hematuria 11/07/2019  . Left inguinal hernia 11/07/2019  .  Physical deconditioning   . Depression with anxiety   . Acute respiratory failure with hypoxia ([place])   . Ischemic colitis ([place]) 09/18/2019  . Pressure injury of skin 09/16/2019  . Bloody diarrhea 09/14/2019  . Abdominal pain, diffuse 09/14/2019  . Acute metabolic encephalopathy 76/19/5093  . Goals of care, counseling/discussion 09/14/2019  . DNR (do not resuscitate) 09/14/2019  . Peripheral neuropathy 09/13/2019  . Gait disorder 09/13/2019  . Multiple myeloma not having achieved remission ([place]) 05/17/2019  . Counseling regarding advance care planning and goals of care 05/17/2019  . Bone metastases ([place]) 05/17/2019  . Postoperative fever 05/09/2019  . S/P TAVR (transcatheter aortic valve replacement) 05/08/2019  . Multiple myeloma ([place]) 05/08/2019  . Acute on chronic diastolic heart failure ([place]) 05/08/2019  . Severe aortic stenosis   . Polymyalgia rheumatica syndrome ([place]) 01/29/2019  . CAD (coronary artery disease) 08/01/2018  . Hematochezia 07/27/2018  . HTN (hypertension) 02/28/2018  . Hypercholesteremia 02/28/2018  . Degeneration of lumbar intervertebral disc 12/06/2017  . Adenomatous polyp  of ascending colon 12/02/2017  . Chronic low back pain 09/26/2017  . Mastoiditis 03/21/2017  . Abnormal TSH 12/23/2016  . PNA (pneumonia)   . AKI (acute kidney injury) ([place])   . Ascending aortic aneurysm ([place]) 02/15/2013  . Atrial fibrillation with RVR ([place]) 01/23/2013  . Well adult exam 05/31/2012  . PAF (paroxysmal atrial fibrillation) ([place]) 08/05/2008  . HLD (hyperlipidemia) 04/08/2007  . Essential hypertension 04/08/2007   Donato Heinz. Owens Shark PT  Norwood Levo 05/06/2020, 5:32 PM  [place], [place], [address] Phone: [phone]   Fax:  [phone]  Name: [name] [name] MRN: [id number] Date of Birth: 09-10-1941

## 2020-05-06 NOTE — Progress Notes (Signed)
[facility] [place]                                       Cardiology Office Note    Date:  05/07/2020   ID:  [name] [name], DOB December 30, 1940, MRN [id number]  PCP:  Cassandria Anger, MD  Cardiologist:  Dr. Aundra Dubin / Dr. Angelena Form & Dr. Cyndia Bent (TAVR)  CC: 1 year s/p TAVR  History of Present Illness:  [name] [name] is a 79 y.o. male with a history of HTN, PAF on Eliquis, anemia, ascending aortic aneurysm, HLD, recently diagnosed multiple myeloma during TAVR work up and severe AS s/p TAVR (05/08/19) who presents to clinic for follow up.  He is followed in our practice by Dr. Aundra Dubin and has been known to have aortic stenosis for several years. He has been see by Dr. Rayann Heman for management of his atrial fibrillation and has had multiple cardioversions in the past. He has been on Eliquis. Echo February 2020 with normal LV size and function, LVEF=60-65%. The aortic valve is thickened and calcified (mean gradient of 48 mmHg, peak gradient 65.9 mmHg, AVA 0.83 cm2) consistent with severe AS. Coronary CTA February 2020 with mild CAD. His ascending aortic aneurysm was 4.3 cm on recent cardiac CTA. He reported a several month history of dyspnea with moderate exertion and fatigue.He was referred to Dr. Angelena Form for consideration of TAVR. Pre TAVR cath 03/07/19 showed no CAD and confirmed severe AS. Pre TAVR CT scans showed multiple indeterminate osseous lesions in the bony pelvis. Follow up MRI confirmed destructive bone lesions and PET scan showed diffuse osseous metastatic disease without findings for a primary neoplasm in the chest, abdomen or pelvis. CT guided biopsy confirmed clear plasma cells. He was referred to Dr. Irene Limbo in the cancer center for evaluation who felt it was reasonable to proceed with TAVR prior to chemo treatment of his multiple myeloma.   He was evaluated by the multidisciplinary valve team and underwent successful TAVR with a 26 mm Edwards Sapien  3 UItra THV x2 (second valve placed due to PVL) via the TF approach on 05/08/19. The following night, [name] [name] developed a post operative fever of unknown significance. He was seen by ID. Repeat WBC, UA, CXR and blood cultures were negative. Post operative echo showed hyperdynamic EF >65% with normally functioning TAVR with elevated mean gradient of 29 mm Hg (likely 2/2 hyperdynamic function in the setting of febrile illness and valve in valve procedure). He had some mild volume overload and treated with one dose of IV lasix. Also had frequent runs of SVT vs afib with RVR and home BB was resumed. He was discharged when fever resolved on home Eliquis and aspirin added. 1 month echo showed echo today shows EF 65%, normally functioning TAVR with a mean gradient of 26 mm Hg (slighlly improved from POD1 echo with a mean gradient of 29 mm Hg) and no PVL. He saw Dr. Irene Limbo and was started on Dexamethasone, Velcade & Revlimid.  He was admitted early in 1/21 with atrial fibrillation/RVR associated with ischemic colitis.  He underwent TEE-guided DCCV to NSR.   He was readmitted later in 1/21 with PNA. Echo in 1/21 showed mean gradient down to 10 mmHg and TEE showed mean gradient 8 mmHg with no perivalvular leakage.   He fell in 4/21, tripped and fractured his left proximal humerus.  He  had ORIF in 5/21.   In 7/21, he had a left inguinal hernia repair.   Today he presents to clinic for follow up. Doing terrible. Mostly limited by neuropathy in his feet. Has no energy. Wishes he never found on he had cancer. Wants to come off amiodarone as he thinks it's toxic to his liver. Has some swelling in his legs and feet. Loosing weight and muscle. No orthopnea or PND.    Past Medical History:  Diagnosis Date  . Ascending aortic aneurysm ([place])   . Bicuspid aortic valve   . Cancer ([place])   . CHF NYHA class I (no symptoms from ordinary activities), acute, diastolic ([place])   . Dysrhythmia 2009   A fib  . Fatty liver     mild  . Fracture    left proximal humerus  . GERD (gastroesophageal reflux disease)   . GI bleeding 07/21/2018   post polypectomy  . Hemorrhoids   . HTN (hypertension)   . Hypercholesteremia   . Hypokalemia   . Internal hemorrhoids   . LBP (low back pain)   . Moderate aortic stenosis   . Osteoarthritis   . Paroxysmal atrial fibrillation ([place])    a. new onset Afib in 07/2008. He underwent ibutilide cardioversion successfully. b. Recurrence 01/2013 s/p TEE/DCCV - was on Xarelto but he stopped it as he was convinced it was causing joint pn. c. Recurrence 01/2016 - spont conv to NSR. Pt took Eliquis x1 mo then declined further anticoag. d. Recurrence 07/2016.  [name] Pneumonia   . Tubular adenoma of colon     Past Surgical History:  Procedure Laterality Date  . BACK SURGERY  x12 years ago  . CARDIAC CATHETERIZATION  2020  . CARDIAC VALVE REPLACEMENT  2020  . CARDIOVERSION N/A 01/26/2013   Procedure: CARDIOVERSION;  Surgeon: Larey Dresser, MD;  Location: [facility] ENDOSCOPY;  Service: Cardiovascular;  Laterality: N/A;  . CARDIOVERSION N/A 10/28/2017   Procedure: CARDIOVERSION;  Surgeon: Larey Dresser, MD;  Location: [facility] ENDOSCOPY;  Service: Cardiovascular;  Laterality: N/A;  . CARDIOVERSION N/A 03/03/2018   Procedure: CARDIOVERSION;  Surgeon: Lelon Perla, MD;  Location: [facility] ENDOSCOPY;  Service: Cardiovascular;  Laterality: N/A;  . CARDIOVERSION N/A 09/19/2019   Procedure: CARDIOVERSION;  Surgeon: Larey Dresser, MD;  Location: [facility] ENDOSCOPY;  Service: Cardiovascular;  Laterality: N/A;  . COLONOSCOPY    . COLONOSCOPY  07/17/2018   at [facility]  . HEMORRHOID SURGERY    . INGUINAL HERNIA REPAIR Left 03/18/2020   Procedure: OPEN LEFT INGUINAL HERNIA REPAIR;  Surgeon: Alphonsa Overall, MD;  Location: [place];  Service: General;  Laterality: Left;  . LUMBAR LAMINECTOMY    . ORIF HUMERUS FRACTURE Left 01/15/2020   Procedure: OPEN REDUCTION INTERNAL FIXATION (ORIF) PROXIMAL HUMERUS FRACTURE;  Surgeon: Nicholes Stairs, MD;  Location: [place];  Service: Orthopedics;  Laterality: Left;  . POLYPECTOMY    . RIGHT HEART CATH N/A 08/20/2019   Procedure: RIGHT HEART CATH;  Surgeon: Larey Dresser, MD;  Location: [place] CV LAB;  Service: Cardiovascular;  Laterality: N/A;  . RIGHT/LEFT HEART CATH AND CORONARY ANGIOGRAPHY N/A 03/07/2019   Procedure: RIGHT/LEFT HEART CATH AND CORONARY ANGIOGRAPHY;  Surgeon: Burnell Blanks, MD;  Location: [place] CV LAB;  Service: Cardiovascular;  Laterality: N/A;  . [name]  04/2019  . TEE WITHOUT CARDIOVERSION N/A 01/26/2013   Procedure: TRANSESOPHAGEAL ECHOCARDIOGRAM (TEE);  Surgeon: Larey Dresser, MD;  Location: [place];  Service: Cardiovascular;  Laterality: N/A;  .  TEE WITHOUT CARDIOVERSION N/A 10/28/2017   Procedure: TRANSESOPHAGEAL ECHOCARDIOGRAM (TEE);  Surgeon: Laurey Morale, MD;  Location: [facility] ENDOSCOPY;  Service: Cardiovascular;  Laterality: N/A;  . TEE WITHOUT CARDIOVERSION N/A 05/08/2019   Procedure: TRANSESOPHAGEAL ECHOCARDIOGRAM (TEE);  Surgeon: Kathleene Hazel, MD;  Location: [facility] INVASIVE CV LAB;  Service: Open Heart Surgery;  Laterality: N/A;  . TEE WITHOUT CARDIOVERSION N/A 09/19/2019   Procedure: TRANSESOPHAGEAL ECHOCARDIOGRAM (TEE);  Surgeon: Laurey Morale, MD;  Location: [facility] ENDOSCOPY;  Service: Cardiovascular;  Laterality: N/A;  . TRANSCATHETER AORTIC VALVE REPLACEMENT, TRANSFEMORAL N/A 05/08/2019   Procedure: TRANSCATHETER AORTIC VALVE REPLACEMENT, TRANSFEMORAL;  Surgeon: Kathleene Hazel, MD;  Location: [facility] INVASIVE CV LAB;  Service: Open Heart Surgery;  Laterality: N/A;    Current Medications: Outpatient Medications Prior to Visit  Medication Sig Dispense Refill  . acyclovir (ZOVIRAX) 400 MG tablet Take 1 tablet (400 mg total) by mouth 2 (two) times daily. 180 tablet 3  . amLODipine (NORVASC) 5 MG tablet TAKE 1 TABLET BY MOUTH EVERY DAY 90 tablet 3  . apixaban (ELIQUIS) 5 MG TABS tablet Take 5 mg by mouth 2 (two)  times daily.    [name] b complex vitamins tablet Take 1 tablet by mouth daily. 100 tablet 3  . Calcium-Magnesium 500-250 MG TABS Take 1 tablet by mouth daily.    . Carboxymethylcellul-Glycerin (LUBRICATING EYE DROPS OP) Place 1 drop into both eyes daily as needed (dry eyes).    . Cholecalciferol (VITAMIN D) 50 MCG (2000 UT) tablet Take 2,000 Units by mouth daily.    [name] dexamethasone (DECADRON) 4 MG tablet Take 12 mg by mouth once a week. On tuesdays    . diclofenac Sodium (VOLTAREN) 1 % GEL Apply 1 application topically 2 (two) times daily as needed (pain.).    [name] DULoxetine (CYMBALTA) 60 MG capsule TAKE 1 CAPSULE BY MOUTH EVERY DAY 90 capsule 1  . ergocalciferol (VITAMIN D2) 1.25 MG (50000 UT) capsule Take 50,000 Units by mouth every Friday. In the morning    . famotidine (PEPCID) 40 MG tablet Take 40 mg by mouth daily.    . furosemide (LASIX) 20 MG tablet Take 20 mg by mouth every other day.    . gabapentin (NEURONTIN) 300 MG capsule Take 1 capsule (300 mg total) by mouth 3 (three) times daily.    [name] LORazepam (ATIVAN) 0.5 MG tablet TAKE 1 TABLET (0.5 MG TOTAL) BY MOUTH EVERY 8 (EIGHT) HOURS AS NEEDED (NAUSEA OR VOMITING). 60 tablet 1  . metoprolol succinate (TOPROL-XL) 25 MG 24 hr tablet Take 25 mg by mouth 2 (two) times daily.    [name] oxyCODONE (OXY IR/ROXICODONE) 5 MG immediate release tablet Take 2 tablets (10 mg total) by mouth every 4 (four) hours as needed for moderate pain or severe pain. 40 tablet 0  . polyethylene glycol powder (GLYCOLAX/MIRALAX) 17 GM/SCOOP powder Take 17-34 g by mouth 2 (two) times daily as needed for moderate constipation. 500 g 5  . senna-docusate (SENOKOT-S) 8.6-50 MG tablet Take 2 tablets by mouth at bedtime.    [name] spironolactone (ALDACTONE) 25 MG tablet Take 1 tablet (25 mg total) by mouth daily. 30 tablet 3  . amiodarone (PACERONE) 200 MG tablet Take 200 mg by mouth daily.    [name] loratadine (CLARITIN) 10 MG tablet Take 10 mg by mouth daily. (Patient not taking: Reported on  05/07/2020)     No facility-administered medications prior to visit.     Allergies:   Xarelto [rivaroxaban], Ambien [zolpidem], Corticosteroids, Ramipril, and  Benazepril   Social History   Socioeconomic History  . Marital status: Married    Spouse name: Not on file  . Number of children: 0  . Years of education: Not on file  . Highest education level: Not on file  Occupational History  . Occupation: Retired Lobbyist: [place]  Tobacco Use  . Smoking status: Never Smoker  . Smokeless tobacco: Never Used  Vaping Use  . Vaping Use: Never used  Substance and Sexual Activity  . Alcohol use: Not Currently  . Drug use: No  . Sexual activity: Yes  Other Topics Concern  . Not on file  Social History Narrative   Patient lives in [place] w/ his wife. He is a native of [place]. 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   Right-handed   Caffeine: 2 cups coffee per day   Two story home   Social Determinants of Health   Financial Resource Strain:   . Difficulty of Paying Living Expenses: Not on file  Food Insecurity:   . Worried About Charity fundraiser in the Last Year: Not on file  . Ran Out of Food in the Last Year: Not on file  Transportation Needs:   . Lack of Transportation (Medical): Not on file  . Lack of Transportation (Non-Medical): Not on file  Physical Activity:   . Days of Exercise per Week: Not on file  . Minutes of Exercise per Session: Not on file  Stress:   . Feeling of Stress : Not on file  Social Connections:   . Frequency of Communication with Friends and Family: Not on file  . Frequency of Social Gatherings with Friends and Family: Not on file  . Attends Religious Services: Not on file  . Active Member of Clubs or Organizations: Not on file  . Attends Archivist Meetings: Not on file  . Marital Status: Not on file     Family History:  The patient's family history includes Colon  cancer (age of onset: 43) in his mother; Hypertension in an other family member.     ROS:   Please see the history of present illness.    ROS All other systems reviewed and are negative.   PHYSICAL EXAM:   VS:  BP 120/62   Pulse (!) 57   Ht $R'5\' 11"'rX$  (1.803 m)   Wt 168 lb 9.6 oz (76.5 kg)   SpO2 99%   BMI 23.51 kg/m  GEN: Well nourished, well developed, in no acute distress HEENT: normal Neck: no JVD or masses Cardiac: bradycardic; soft flow. No rubs, or gallops.  Respiratory:  clear to auscultation bilaterally, normal work of breathing GI: soft, nontender, nondistended, + BS MS: no deformity or atrophy Skin: warm and dry, no rash. Neuro:  Alert and Oriented x 3, Strength and sensation are intact Psych: euthymic mood, full affect   Wt Readings from Last 3 Encounters:  05/07/20 168 lb 9.6 oz (76.5 kg)  04/25/20 172 lb (78 kg)  04/08/20 166 lb 11.2 oz (75.6 kg)      Studies/Labs Reviewed:   EKG:  EKG is NOT ordered today.  Recent Labs: 07/30/2019: B Natriuretic Peptide 126.5 10/02/2019: Magnesium 2.5 04/08/2020: Hemoglobin 13.3; Platelets 130 04/25/2020: ALT 58; BUN 27; Creatinine, Ser 1.25; Potassium 4.1; Sodium 141; TSH 7.975   Lipid Panel    Component Value Date/Time   CHOL 144 08/01/2018 1119   TRIG 167.0 (H)  08/01/2018 1119   HDL 28.20 (L) 08/01/2018 1119   CHOLHDL 5 08/01/2018 1119   VLDL 33.4 08/01/2018 1119   LDLCALC 82 08/01/2018 1119   LDLDIRECT 151.5 11/20/2007 1623    Additional studies/ records that were reviewed today include:  TAVR OPERATIVE NOTE   Date of Procedure:05/08/2019  Preoperative Diagnosis:Severe Aortic Stenosis   Postoperative Diagnosis:Same   Procedure:   Transcatheter Aortic Valve Replacement - PercutaneousRightTransfemoral Approach EdwardsSapien 3 Ultra THV (size85mm, model # 9750TFX, serial # [id number]) EdwardsSapien 3 Ultra THV (size14mm, model # 9750TFX,  serial # [id number])  Co-Surgeons:Bryan Alveria Apley, MD and Lauree Chandler, MD   Anesthesiologist:R. Roanna Banning, MD  Dala Dock, MD  Pre-operative Echo Findings: ? Severe aortic stenosis ? Normalleft ventricular systolic function  Post-operative Echo Findings: ? Noparavalvular leak ? Normalleft ventricular systolic function  _____________    Echo 05/09/19 IMPRESSIONS 1. - TAVR: A 26 mm S3 x 2 was placed (second valve placed due to PVL). A mild to moderate degree of prosthetic valve stenosis is present. There is no regurgitation or paravalvular leak detected. The V max is 3.6 m/s. The peak/mean gradient across the  valve is 49/29 mmHG. The LVOT VTI is estimated to be 34 cm and the LV is hyperdynamic with an intracavitary gradient ~6 mmHG. Overall, the increased gradients across the valve could be related to the ViV procedure (2 S3 placed) and the hyperdynamic state of the LV (LVOT VTI 34 cm). The doppler velocity index is 0.57 and would indicate a mild degree of stenosis. The Vmax and gradients are increased compared with yesterday's valves after the TAVR. Clinical correlation is recommended. 2. The left ventricle has hyperdynamic systolic function, with an ejection fraction of >65%. The cavity size was small. Left ventricular diastolic Doppler parameters are consistent with pseudonormalization. Elevated mean left atrial pressure No evidence of left ventricular regional wall motion abnormalities. 3. The right ventricle has normal systolic function. The cavity was normal. There is no increase in right ventricular wall thickness. 4. Left atrial size was mildly dilated. 5. Right atrial size was mildly dilated. 6. The mitral valve is grossly normal. There is mild mitral annular calcification present. 7. The tricuspid valve is grossly normal. 8. A 26 an Edwards 2 Edwards S3 valves placed due to  PVL during procedure valve is present in the aortic position. Procedure Date: 05/09/2019. 9. Mild-moderate stenosis of the aortic valve. 10. The aorta is normal unless otherwise noted. 11. The aortic root is normal in size and structure. 12. When compared to the prior study: S/p TAVR. V max and gradients higher across TAVR compared with images after procedure yesterday.  ________________  Echo 05/30/19 IMPRESSIONS  1. The left ventricle has hyperdynamic systolic function, with an ejection fraction of >65%. The cavity size was normal. There is mildly increased left ventricular wall thickness. Left ventricular diastolic Doppler parameters are consistent with  impaired relaxation. Elevated mean left atrial pressure No evidence of left ventricular regional wall motion abnormalities.  2. The right ventricle has normal systolic function. The cavity was mildly enlarged. There is No increase in right ventricular wall thickness.  3. Left atrial size was normal.  4. Right atrial size was normal.  5. The mitral valve is degenerative. mild thickening of the mitral valve leaflet. mild calcification of the mitral valve leaflet. There is mild mitral annular calcification present. No evidence of mitral valve stenosis.  6. Peak trans TAVR velocity - 3.38m/s, mean 29mmHg.  7. Pulmonic valve regurgitation is trivial by color flow  Doppler.  8. The aorta is abnormal unless otherwise noted.  9. There is dilatation of the ascending aorta measuring 44 mm.  Aortic Valve: The aortic valve has been repaired/replaced Aortic valve regurgitation was not visualized by color flow Doppler. Peak trans TAVR velocity - 3.7m/s, mean .  ___________________  Echo 05/07/20 IMPRESSIONS  1. Left ventricular ejection fraction, by estimation, is 60 to 65%. The  left ventricle has normal function. The left ventricle has no regional  wall motion abnormalities. Left ventricular diastolic parameters are  consistent with Grade I  diastolic  dysfunction (impaired relaxation).  2. Right ventricular systolic function is normal. The right ventricular  size is normal. There is normal pulmonary artery systolic pressure.  3. The mitral valve is normal in structure. No evidence of mitral valve  regurgitation. No evidence of mitral stenosis.  4. 26 mm Edwards Sapien 3 UItra THV x2 (second valve placed due to  perivalvular leak) via the TF approach on 05/08/19. . The aortic valve has  been repaired/replaced. Aortic valve regurgitation is not visualized. No  aortic stenosis is present. There is a  26 mm Edwards Sapien prosthetic (TAVR) valve present in the aortic position. Echo findings are consistent with normal structure and function  of the aortic valve prosthesis. Aortic valve area, by VTI measures 1.70  cm. Aortic valve mean gradient measures  15.0 mmHg. Aortic valve Vmax measures 2.62 m/s.  5. Aortic dilatation noted. There is mild to moderate dilatation of the  ascending aorta measuring 45 mm.  6. The inferior vena cava is normal in size with greater than 50%  respiratory variability, suggesting right atrial pressure of 3 mmHg.   ASSESSMENT & PLAN:   Severe AS s/p TAVR: echo today shows EF 60%, normally functioning TAVR with a mean gradient of 15 mm hg and no PVL. He has NYHA class II symptoms. He is mostly limited by neuropathy in his feet. SBE prophylaxis discussed; he gets ABx from his dentist. Continue on Eliquis alone. Continue regular follow up with Dr. Shirlee Latch.   HTN: BP well controlled today. No changes made  PAF: continue Eliquis. He wants to come off amiodarone as he thinks this is toxic to his liver. He is aware that this increases his risk of going back into atrial fibrillation. He understand this and still wants to discontinue it anyway.   Ascending aortic aneurysm: stable 4.5 cm ascending thoracic aortic aneurysm on chest CT 09/2019. Recommend semi-annual imaging followup by CTA or MRA and referral to  cardiothoracic surgery if not already obtained. He Is not interested in anymore scans at this time. Defer to Dr. Shirlee Latch.  Pulmonary nodules: Scattered 2-3 mm pulmonary nodules in the lungs bilaterally, nonspecific but statistically likely benign. No follow-up needed if patient is low-risk (and has no known or suspected primaryeoplasm). Non-contrast chest CT can be considered in 12 months if patient is high-risk. Patient is a never smoker and being actively followed by oncology. Recent PET scan was negative in lung.   Multiple Myeloma: diagnosed during TAVR work up and followed by Dr. Candise Che. Now in remission. Mostly limited by neuropathy in his feet.    Medication Adjustments/Labs and Tests Ordered: Current medicines are reviewed at length with the patient today.  Concerns regarding medicines are outlined above.  Medication changes, Labs and Tests ordered today are listed in the Patient Instructions below. Patient Instructions  Medication Instructions:  1) DISCONTINUE Amiodarone  *If you need a refill on your cardiac medications before your next appointment, please  call your pharmacy*   Lab Work: None If you have labs (blood work) drawn today and your tests are completely normal, you will receive your results only by: [name] MyChart Message (if you have MyChart) OR . A paper copy in the mail If you have any lab test that is abnormal or we need to change your treatment, we will call you to review the results.   Testing/Procedures: None   Follow-Up:  Plan to see Dr. Aundra Dubin in February as previously mentioned.  Their office will be in contact to get you that appointment as it gets closer.   Other Instructions      Signed, Angelena Form, PA-C  05/07/2020 [facility] [address], [place], [place]  [address] Phone: [phone]; Fax: [phone]

## 2020-05-07 ENCOUNTER — Ambulatory Visit (HOSPITAL_COMMUNITY): Payer: Medicare Other | Attending: Cardiology

## 2020-05-07 ENCOUNTER — Encounter: Payer: Self-pay | Admitting: Physician Assistant

## 2020-05-07 ENCOUNTER — Ambulatory Visit (INDEPENDENT_AMBULATORY_CARE_PROVIDER_SITE_OTHER): Payer: Medicare Other | Admitting: Physician Assistant

## 2020-05-07 ENCOUNTER — Other Ambulatory Visit: Payer: Self-pay

## 2020-05-07 VITALS — BP 120/62 | HR 57 | Ht 71.0 in | Wt 168.6 lb

## 2020-05-07 DIAGNOSIS — Z952 Presence of prosthetic heart valve: Secondary | ICD-10-CM | POA: Insufficient documentation

## 2020-05-07 DIAGNOSIS — I712 Thoracic aortic aneurysm, without rupture, unspecified: Secondary | ICD-10-CM

## 2020-05-07 LAB — ECHOCARDIOGRAM COMPLETE
AR max vel: 1.53 cm2
AV Area VTI: 1.7 cm2
AV Area mean vel: 1.44 cm2
AV Mean grad: 15 mmHg
AV Peak grad: 27.6 mmHg
Ao pk vel: 2.63 m/s
Area-P 1/2: 2.11 cm2
S' Lateral: 2.3 cm

## 2020-05-07 NOTE — Patient Instructions (Addendum)
Medication Instructions:  1) DISCONTINUE Amiodarone  *If you need a refill on your cardiac medications before your next appointment, please call your pharmacy*   Lab Work: None If you have labs (blood work) drawn today and your tests are completely normal, you will receive your results only by: [name] MyChart Message (if you have MyChart) OR . A paper copy in the mail If you have any lab test that is abnormal or we need to change your treatment, we will call you to review the results.   Testing/Procedures: None   Follow-Up:  Plan to see Dr. Aundra Dubin in February as previously mentioned.  Their office will be in contact to get you that appointment as it gets closer.   Other Instructions

## 2020-05-09 ENCOUNTER — Other Ambulatory Visit: Payer: Self-pay | Admitting: Hematology

## 2020-05-12 ENCOUNTER — Other Ambulatory Visit: Payer: Self-pay

## 2020-05-12 ENCOUNTER — Ambulatory Visit: Payer: Medicare Other | Admitting: Physical Therapy

## 2020-05-12 DIAGNOSIS — R262 Difficulty in walking, not elsewhere classified: Secondary | ICD-10-CM

## 2020-05-12 DIAGNOSIS — I890 Lymphedema, not elsewhere classified: Secondary | ICD-10-CM

## 2020-05-12 DIAGNOSIS — R293 Abnormal posture: Secondary | ICD-10-CM | POA: Diagnosis not present

## 2020-05-12 DIAGNOSIS — M6281 Muscle weakness (generalized): Secondary | ICD-10-CM | POA: Diagnosis not present

## 2020-05-12 DIAGNOSIS — R296 Repeated falls: Secondary | ICD-10-CM | POA: Diagnosis not present

## 2020-05-12 NOTE — Therapy (Signed)
[place], [place], [address] Phone: [phone]   Fax:  [phone]  Physical Therapy Treatment  Patient Details  Name: [name] [name] MRN: [id number] Date of Birth: 1941-06-13 Referring Provider (PT): Dr. Irene Limbo    Encounter Date: 05/12/2020   PT End of Session - 05/12/20 1737    Visit Number 6    Number of Visits 20    Date for PT Re-Evaluation 06/03/20    PT Start Time 0900    PT Stop Time 0945    PT Time Calculation (min) 45 min    Activity Tolerance Patient tolerated treatment well    Behavior During Therapy [facility] for tasks assessed/performed           Past Medical History:  Diagnosis Date  . Ascending aortic aneurysm ([place])   . Bicuspid aortic valve   . Cancer ([place])   . CHF NYHA class I (no symptoms from ordinary activities), acute, diastolic ([place])   . Dysrhythmia 2009   A fib  . Fatty liver    mild  . Fracture    left proximal humerus  . GERD (gastroesophageal reflux disease)   . GI bleeding 07/21/2018   post polypectomy  . Hemorrhoids   . HTN (hypertension)   . Hypercholesteremia   . Hypokalemia   . Internal hemorrhoids   . LBP (low back pain)   . Moderate aortic stenosis   . Osteoarthritis   . Paroxysmal atrial fibrillation ([place])    a. new onset Afib in 07/2008. He underwent ibutilide cardioversion successfully. b. Recurrence 01/2013 s/p TEE/DCCV - was on Xarelto but he stopped it as he was convinced it was causing joint pn. c. Recurrence 01/2016 - spont conv to NSR. Pt took Eliquis x1 mo then declined further anticoag. d. Recurrence 07/2016.  [name] Pneumonia   . Tubular adenoma of colon     Past Surgical History:  Procedure Laterality Date  . BACK SURGERY  x12 years ago  . CARDIAC CATHETERIZATION  2020  . CARDIAC VALVE REPLACEMENT  2020  . CARDIOVERSION N/A 01/26/2013   Procedure: CARDIOVERSION;  Surgeon: Larey Dresser, MD;  Location: [facility] ENDOSCOPY;  Service: Cardiovascular;   Laterality: N/A;  . CARDIOVERSION N/A 10/28/2017   Procedure: CARDIOVERSION;  Surgeon: Larey Dresser, MD;  Location: [facility] ENDOSCOPY;  Service: Cardiovascular;  Laterality: N/A;  . CARDIOVERSION N/A 03/03/2018   Procedure: CARDIOVERSION;  Surgeon: Lelon Perla, MD;  Location: [facility] ENDOSCOPY;  Service: Cardiovascular;  Laterality: N/A;  . CARDIOVERSION N/A 09/19/2019   Procedure: CARDIOVERSION;  Surgeon: Larey Dresser, MD;  Location: [facility] ENDOSCOPY;  Service: Cardiovascular;  Laterality: N/A;  . COLONOSCOPY    . COLONOSCOPY  07/17/2018   at [facility]  . HEMORRHOID SURGERY    . INGUINAL HERNIA REPAIR Left 03/18/2020   Procedure: OPEN LEFT INGUINAL HERNIA REPAIR;  Surgeon: Alphonsa Overall, MD;  Location: [place];  Service: General;  Laterality: Left;  . LUMBAR LAMINECTOMY    . ORIF HUMERUS FRACTURE Left 01/15/2020   Procedure: OPEN REDUCTION INTERNAL FIXATION (ORIF) PROXIMAL HUMERUS FRACTURE;  Surgeon: Nicholes Stairs, MD;  Location: [place];  Service: Orthopedics;  Laterality: Left;  . POLYPECTOMY    . RIGHT HEART CATH N/A 08/20/2019   Procedure: RIGHT HEART CATH;  Surgeon: Larey Dresser, MD;  Location: [place] CV LAB;  Service: Cardiovascular;  Laterality: N/A;  . RIGHT/LEFT HEART CATH AND CORONARY ANGIOGRAPHY N/A 03/07/2019   Procedure: RIGHT/LEFT HEART CATH AND CORONARY ANGIOGRAPHY;  Surgeon: Burnell Blanks, MD;  Location: [place] CV LAB;  Service: Cardiovascular;  Laterality: N/A;  . [name]  04/2019  . TEE WITHOUT CARDIOVERSION N/A 01/26/2013   Procedure: TRANSESOPHAGEAL ECHOCARDIOGRAM (TEE);  Surgeon: Larey Dresser, MD;  Location: [place];  Service: Cardiovascular;  Laterality: N/A;  . TEE WITHOUT CARDIOVERSION N/A 10/28/2017   Procedure: TRANSESOPHAGEAL ECHOCARDIOGRAM (TEE);  Surgeon: Larey Dresser, MD;  Location: [facility] ENDOSCOPY;  Service: Cardiovascular;  Laterality: N/A;  . TEE WITHOUT CARDIOVERSION N/A 05/08/2019   Procedure: TRANSESOPHAGEAL ECHOCARDIOGRAM (TEE);   Surgeon: Burnell Blanks, MD;  Location: [place] CV LAB;  Service: Open Heart Surgery;  Laterality: N/A;  . TEE WITHOUT CARDIOVERSION N/A 09/19/2019   Procedure: TRANSESOPHAGEAL ECHOCARDIOGRAM (TEE);  Surgeon: Larey Dresser, MD;  Location: [facility] ENDOSCOPY;  Service: Cardiovascular;  Laterality: N/A;  . TRANSCATHETER AORTIC VALVE REPLACEMENT, TRANSFEMORAL N/A 05/08/2019   Procedure: TRANSCATHETER AORTIC VALVE REPLACEMENT, TRANSFEMORAL;  Surgeon: Burnell Blanks, MD;  Location: [place] CV LAB;  Service: Open Heart Surgery;  Laterality: N/A;    There were no vitals filed for this visit.   Subjective Assessment - 05/12/20 0951    Subjective Pt reports he is still having pain in his legs.  The swelling came back in his ankles with no compression.  He tried wearing compression stockings with shoes on to go to church, but he had to take them off as soon as he got home because of pain in his leg.  He does not want to bandage today, but wants to exercise    Patient is accompained by: Family member   wife   Pertinent History During preliminary work up for valve replacement in July of 2020 it was discovered that pt had large lytic lesions in his pelvis due to  multiple myeloma.  He had a TAVR in August 2021  and started chemotherapy for multiple myeloma which was successful in reducing the lestions. However,  he developed peripheral neuropathy and chemotherapy is being adjusted. He has multiple hospitalizations in dec-jan 2021 for septic colitis with acute kidney injury and pneumonia with associated deconditioning and difficutly walking due to Chemotherapy induced peripheral neuropathy.  Pt  had home health PT and continues to walk with a cane. Past history also significant for afib and back surgery.  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                             [facility] Adult PT Treatment/Exercise - 05/12/20 0001      Lumbar Exercises: Supine   Clam 10  reps    Clam Limitations for both legs and also isometrically to both legs       Lumbar Exercises: Sidelying   Clam Both;10 reps    Hip Abduction Both;10 reps      Knee/Hip Exercises: Supine   Short Arc Quad Sets Strengthening;Right;Left;2 sets;10 reps    Short Arc Quad Sets Limitations 3 #    Straight Leg Raises Strengthening;Both;10 reps    Other Supine Knee/Hip Exercises ankle dorsi and plantar flexion      Knee/Hip Exercises: Sidelying   Other Sidelying Knee/Hip Exercises manual resistance for hip extension 10 reps on each leg. Pt with not c/o pain       Shoulder Exercises: Supine   Protraction Strengthening;Right;Left;5 reps;Weights    Protraction Weight (lbs) 3    Flexion Strengthening;Right;Left;10 reps    Shoulder Flexion Weight (  lbs) 3    Other Supine Exercises bicep curls 10 reps with 3 pounds       Shoulder Exercises: Sidelying   ABduction AROM;Right;Left;5 reps      Manual Therapy   Manual Therapy Edema management    Edema Management applied EdemaWear to right lower leg for pt to try while he did exercise portion of treatment.  Along with manual compression below medial and lateral malleli, edema in ankle appeared to be lessened at end of session                     PT Short Term Goals - 04/15/20 1925      PT SHORT TERM GOAL #1   Title Pt and wife feel that they are able to manage pt feet and ankle swelling at home    Time 4    Period Weeks    Status New      PT SHORT TERM GOAL #2   Title Pt will increase # of reps of sit to stand to 10 reps in 30 seconds indicating an improvment in functional strength    Baseline 5 reps in 30 seconds on 12/31/2019    Time 4    Period Weeks    Status Revised      PT SHORT TERM GOAL #3   Title Pt will decrease normal TUG score to < 14 seconds indicating an improvmemnt in functional mobility    Baseline 25.35 sec on 12/31/2019, 16.47 sec on 04/15/20    Time 4    Period Weeks    Status Revised             PT  Long Term Goals - 04/15/20 1927      PT LONG TERM GOAL #1   Title Pt will report the pain in his legs is decreased to 4/10    Baseline 6/10 on eval, 5/10 at 04/15/2020    Time 8    Period Weeks    Status On-going      PT LONG TERM GOAL #2   Title Pt will report his tiredness with walking is decreased by 50%    Time 8    Period Weeks    Status On-going      PT LONG TERM GOAL #3   Title Pt will increase # of reps of sit to stand to 10 reps in 30 seconds indicating an improvment in functional strength    Baseline 5 reps in 30 secons on 12/31/2019, 8 reps on 04/15/2020    Time 8    Period Weeks    Status Revised      PT LONG TERM GOAL #4   Title Pt will decrease normal TUG score to < 12  seconds indicating an improvmemnt in functional mobility    Baseline 25.35 on 12/31/2019, 16.46. on 04/15/2020    Time 8    Period Weeks    Status Revised      PT LONG TERM GOAL #5   Title Pt will increae BERG balancer score to > 46 for moderate risk for fall    Baseline 01/07/20 score is 36 and < 36 is high risk for falls if walking without at device ( close to 100%), 04/15/2020 score is 41    Time 8    Period Weeks    Status On-going      PT LONG TERM GOAL #6   Title Pt will be able to do a floor to stand transfer with min  assist    Time 8    Period Weeks    Status On-going                 Plan - 05/12/20 1737    Clinical Impression Statement Encouraged pt and wife to use EdemaWear to control swelling in ankles at home and to wear shoes more at home to support arches and hopefully decrease the pain he feels in his feet.  Pt did very well with exercises and hopefully will be able to progress to standing and gait exercises if he is able get pain more controlled  implemented exercise program today with alternating UE and LE and pt able to tolerate exercise well.    Comorbidities a fib, recent sepsis, kidney injury and pneumonia, ongoing chemo, past back surgery    Stability/Clinical Decision  Making Evolving/Moderate complexity    Rehab Potential Good    PT Frequency 2x / week    PT Duration 8 weeks    PT Treatment/Interventions ADLs/Self Care Home Management;DME Instruction;Gait training;Stair training;Balance training;Therapeutic exercise;Therapeutic activities;Functional mobility training;Neuromuscular re-education;Cognitive remediation;Patient/family education;Manual lymph drainage;Orthotic Fit/Training;Manual techniques;Compression bandaging;Passive range of motion;Taping    PT Next Visit Plan recheck goals see how pt responded to interval exercises. and progress Measure feet. prn if the agree, teach wife MLD and bandaging to both feet to decrease edemal Gait training  change direction, change speeds, direction, etc, stair training.  Eventually do floor to stand transfers  Progress to UE and LE strengthening, then standing work for balance and strength and advanced gait. consider inteval training for strengthening    Consulted and Agree with Plan of Care Patient;Family member/caregiver    Family Member Consulted wife           Patient will benefit from skilled therapeutic intervention in order to improve the following deficits and impairments:  Abnormal gait, Decreased balance, Decreased endurance, Decreased mobility, Difficulty walking, Impaired sensation, Impaired perceived functional ability, Increased edema, Decreased knowledge of precautions, Decreased activity tolerance, Decreased strength, Pain, Postural dysfunction  Visit Diagnosis: Difficulty in walking, not elsewhere classified  Muscle weakness (generalized)  Abnormal posture  Repeated falls  Lymphedema, not elsewhere classified     Problem List Patient Active Problem List   Diagnosis Date Noted  . Closed fracture of left proximal humerus 01/15/2020  . Acute cystitis without hematuria 11/07/2019  . Left inguinal hernia 11/07/2019  . Physical deconditioning   . Depression with anxiety   . Acute  respiratory failure with hypoxia ([place])   . Ischemic colitis ([place]) 09/18/2019  . Pressure injury of skin 09/16/2019  . Bloody diarrhea 09/14/2019  . Abdominal pain, diffuse 09/14/2019  . Acute metabolic encephalopathy 96/29/5284  . Goals of care, counseling/discussion 09/14/2019  . DNR (do not resuscitate) 09/14/2019  . Peripheral neuropathy 09/13/2019  . Gait disorder 09/13/2019  . Multiple myeloma not having achieved remission ([place]) 05/17/2019  . Counseling regarding advance care planning and goals of care 05/17/2019  . Bone metastases ([place]) 05/17/2019  . Postoperative fever 05/09/2019  . S/P TAVR (transcatheter aortic valve replacement) 05/08/2019  . Multiple myeloma ([place]) 05/08/2019  . Acute on chronic diastolic heart failure ([place]) 05/08/2019  . Severe aortic stenosis   . Polymyalgia rheumatica syndrome ([place]) 01/29/2019  . CAD (coronary artery disease) 08/01/2018  . Hematochezia 07/27/2018  . HTN (hypertension) 02/28/2018  . Hypercholesteremia 02/28/2018  . Degeneration of lumbar intervertebral disc 12/06/2017  . Adenomatous polyp of ascending colon 12/02/2017  . Chronic low back pain 09/26/2017  . Mastoiditis 03/21/2017  .  Abnormal TSH 12/23/2016  . PNA (pneumonia)   . AKI (acute kidney injury) ([place])   . Ascending aortic aneurysm ([place]) 02/15/2013  . Atrial fibrillation with RVR ([place]) 01/23/2013  . Well adult exam 05/31/2012  . PAF (paroxysmal atrial fibrillation) ([place]) 08/05/2008  . HLD (hyperlipidemia) 04/08/2007  . Essential hypertension 04/08/2007   Donato Heinz. Owens Shark PT  Norwood Levo 05/12/2020, 5:57 PM  [place], [place], [address] Phone: [phone]   Fax:  [phone]  Name: [name] [name] MRN: [id number] Date of Birth: November 13, 1940

## 2020-05-14 ENCOUNTER — Other Ambulatory Visit: Payer: Self-pay

## 2020-05-14 ENCOUNTER — Encounter: Payer: Self-pay | Admitting: Physical Therapy

## 2020-05-14 ENCOUNTER — Ambulatory Visit: Payer: Medicare Other | Attending: Hematology | Admitting: Physical Therapy

## 2020-05-14 DIAGNOSIS — R262 Difficulty in walking, not elsewhere classified: Secondary | ICD-10-CM | POA: Insufficient documentation

## 2020-05-14 DIAGNOSIS — R293 Abnormal posture: Secondary | ICD-10-CM | POA: Insufficient documentation

## 2020-05-14 DIAGNOSIS — R296 Repeated falls: Secondary | ICD-10-CM | POA: Insufficient documentation

## 2020-05-14 DIAGNOSIS — I890 Lymphedema, not elsewhere classified: Secondary | ICD-10-CM

## 2020-05-14 DIAGNOSIS — M6281 Muscle weakness (generalized): Secondary | ICD-10-CM | POA: Insufficient documentation

## 2020-05-14 NOTE — Therapy (Signed)
[place], [place], [address] Phone: [phone]   Fax:  [phone]  Physical Therapy Treatment  Patient Details  Name: [name] [name] MRN: [id number] Date of Birth: 1941-09-02 Referring Provider (PT): Dr. Irene Limbo    Encounter Date: 05/14/2020   PT End of Session - 05/14/20 1405    Visit Number 7    Number of Visits 20    Date for PT Re-Evaluation 06/03/20    PT Start Time 1200    PT Stop Time 1245    PT Time Calculation (min) 45 min    Activity Tolerance Patient tolerated treatment well    Behavior During Therapy [facility] for tasks assessed/performed           Past Medical History:  Diagnosis Date  . Ascending aortic aneurysm ([place])   . Bicuspid aortic valve   . Cancer ([place])   . CHF NYHA class I (no symptoms from ordinary activities), acute, diastolic ([place])   . Dysrhythmia 2009   A fib  . Fatty liver    mild  . Fracture    left proximal humerus  . GERD (gastroesophageal reflux disease)   . GI bleeding 07/21/2018   post polypectomy  . Hemorrhoids   . HTN (hypertension)   . Hypercholesteremia   . Hypokalemia   . Internal hemorrhoids   . LBP (low back pain)   . Moderate aortic stenosis   . Osteoarthritis   . Paroxysmal atrial fibrillation ([place])    a. new onset Afib in 07/2008. He underwent ibutilide cardioversion successfully. b. Recurrence 01/2013 s/p TEE/DCCV - was on Xarelto but he stopped it as he was convinced it was causing joint pn. c. Recurrence 01/2016 - spont conv to NSR. Pt took Eliquis x1 mo then declined further anticoag. d. Recurrence 07/2016.  [name] Pneumonia   . Tubular adenoma of colon     Past Surgical History:  Procedure Laterality Date  . BACK SURGERY  x12 years ago  . CARDIAC CATHETERIZATION  2020  . CARDIAC VALVE REPLACEMENT  2020  . CARDIOVERSION N/A 01/26/2013   Procedure: CARDIOVERSION;  Surgeon: Larey Dresser, MD;  Location: [facility] ENDOSCOPY;  Service: Cardiovascular;   Laterality: N/A;  . CARDIOVERSION N/A 10/28/2017   Procedure: CARDIOVERSION;  Surgeon: Larey Dresser, MD;  Location: [facility] ENDOSCOPY;  Service: Cardiovascular;  Laterality: N/A;  . CARDIOVERSION N/A 03/03/2018   Procedure: CARDIOVERSION;  Surgeon: Lelon Perla, MD;  Location: [facility] ENDOSCOPY;  Service: Cardiovascular;  Laterality: N/A;  . CARDIOVERSION N/A 09/19/2019   Procedure: CARDIOVERSION;  Surgeon: Larey Dresser, MD;  Location: [facility] ENDOSCOPY;  Service: Cardiovascular;  Laterality: N/A;  . COLONOSCOPY    . COLONOSCOPY  07/17/2018   at [facility]  . HEMORRHOID SURGERY    . INGUINAL HERNIA REPAIR Left 03/18/2020   Procedure: OPEN LEFT INGUINAL HERNIA REPAIR;  Surgeon: Alphonsa Overall, MD;  Location: [place];  Service: General;  Laterality: Left;  . LUMBAR LAMINECTOMY    . ORIF HUMERUS FRACTURE Left 01/15/2020   Procedure: OPEN REDUCTION INTERNAL FIXATION (ORIF) PROXIMAL HUMERUS FRACTURE;  Surgeon: Nicholes Stairs, MD;  Location: [place];  Service: Orthopedics;  Laterality: Left;  . POLYPECTOMY    . RIGHT HEART CATH N/A 08/20/2019   Procedure: RIGHT HEART CATH;  Surgeon: Larey Dresser, MD;  Location: [facility];  Service: Cardiovascular;  Laterality: N/A;  . RIGHT/LEFT HEART CATH AND CORONARY ANGIOGRAPHY N/A 03/07/2019   Procedure: RIGHT/LEFT HEART CATH AND CORONARY ANGIOGRAPHY;  Surgeon: Burnell Blanks, MD;  Location: [place] CV LAB;  Service: Cardiovascular;  Laterality: N/A;  . [name]  04/2019  . TEE WITHOUT CARDIOVERSION N/A 01/26/2013   Procedure: TRANSESOPHAGEAL ECHOCARDIOGRAM (TEE);  Surgeon: Larey Dresser, MD;  Location: [place];  Service: Cardiovascular;  Laterality: N/A;  . TEE WITHOUT CARDIOVERSION N/A 10/28/2017   Procedure: TRANSESOPHAGEAL ECHOCARDIOGRAM (TEE);  Surgeon: Larey Dresser, MD;  Location: [facility] ENDOSCOPY;  Service: Cardiovascular;  Laterality: N/A;  . TEE WITHOUT CARDIOVERSION N/A 05/08/2019   Procedure: TRANSESOPHAGEAL ECHOCARDIOGRAM (TEE);   Surgeon: Burnell Blanks, MD;  Location: [place] CV LAB;  Service: Open Heart Surgery;  Laterality: N/A;  . TEE WITHOUT CARDIOVERSION N/A 09/19/2019   Procedure: TRANSESOPHAGEAL ECHOCARDIOGRAM (TEE);  Surgeon: Larey Dresser, MD;  Location: [facility] ENDOSCOPY;  Service: Cardiovascular;  Laterality: N/A;  . TRANSCATHETER AORTIC VALVE REPLACEMENT, TRANSFEMORAL N/A 05/08/2019   Procedure: TRANSCATHETER AORTIC VALVE REPLACEMENT, TRANSFEMORAL;  Surgeon: Burnell Blanks, MD;  Location: [place] CV LAB;  Service: Open Heart Surgery;  Laterality: N/A;    There were no vitals filed for this visit.   Subjective Assessment - 05/14/20 1206    Subjective Pt said that the swelling went away with edemawear on Monday and he was able to sleep better.  Yesterday he did not wear it at all but was outside working and the swelling came back. He used voltaren last night and it helped with the pain. He had no swelling in his feet this morning at 7 am when he woke up. Now , at noon, he had signicant swelling in his right foot and ankle, wth moderate swelling in left foot and ankle  He is wearing sandals, but no edemawear.    Patient is accompained by: Family member    Pertinent History During preliminary work up for valve replacement in July of 2020 it was discovered that pt had large lytic lesions in his pelvis due to  multiple myeloma.  He had a TAVR in August 2021  and started chemotherapy for multiple myeloma which was successful in reducing the lestions. However,  he developed peripheral neuropathy and chemotherapy is being adjusted. He has multiple hospitalizations in dec-jan 2021 for septic colitis with acute kidney injury and pneumonia with associated deconditioning and difficutly walking due to Chemotherapy induced peripheral neuropathy.  Pt  had home health PT and continues to walk with a cane. Past history also significant for afib and back surgery.  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      Patient Stated Goals To get relief with pain in feet and decrease swelling, get more stable  and walk without the came    Currently in Pain? Yes    Pain Score 8     Pain Location Foot    Pain Orientation Right;Left    Pain Type Neuropathic pain    Pain Onset More than a month ago    Pain Frequency Constant    Aggravating Factors  worse when he tries to walk    Pain Relieving Factors it depends                             [facility] Adult PT Treatment/Exercise - 05/14/20 0001      Ambulation/Gait   Ambulation/Gait Yes    Ambulation/Gait Assistance 4: Min guard    Ambulation Distance (Feet) 400 Feet   200 x2   Assistive device Other (Comment)  2 dowel rods/walking poles    Gait Comments cues to keep head up, chest high, longer strides with heel strike.  Pt improved duing session with gait       Posture/Postural Control   Posture/Postural Control Postural limitations    Postural Limitations Rounded Shoulders;Forward head;Decreased lumbar lordosis;Increased thoracic kyphosis    Posture Comments pt needs frequent cues, but is able to improve postture       Exercises   Exercises Shoulder;Knee/Hip      Lumbar Exercises: Standing   Functional Squats 5 reps    Forward Lunge 10 reps;2 seconds    Row Strengthening;Both;20 reps;Theraband    Theraband Level (Row) Level 3 (Green)    Row Limitations both arms, feet side by side and staggered stance with each leg foward with cues to keep core stable       Knee/Hip Exercises: Standing   Lateral Step Up Both;5 reps;Step Height: 6"   2 dowels    Forward Step Up Both;10 reps;Step Height: 6"   2 dowels   Other Standing Knee Exercises standing on airex for bicep curls with 4 pounds x 10 reps     Other Standing Knee Exercises standing with forced ball throw down and catch, throw and catch ball against the wall, dribble                    PT Short Term Goals - 04/15/20 1925      PT SHORT TERM GOAL #1   Title Pt and  wife feel that they are able to manage pt feet and ankle swelling at home    Time 4    Period Weeks    Status New      PT SHORT TERM GOAL #2   Title Pt will increase # of reps of sit to stand to 10 reps in 30 seconds indicating an improvment in functional strength    Baseline 5 reps in 30 seconds on 12/31/2019    Time 4    Period Weeks    Status Revised      PT SHORT TERM GOAL #3   Title Pt will decrease normal TUG score to < 14 seconds indicating an improvmemnt in functional mobility    Baseline 25.35 sec on 12/31/2019, 16.47 sec on 04/15/20    Time 4    Period Weeks    Status Revised             PT Long Term Goals - 04/15/20 1927      PT LONG TERM GOAL #1   Title Pt will report the pain in his legs is decreased to 4/10    Baseline 6/10 on eval, 5/10 at 04/15/2020    Time 8    Period Weeks    Status On-going      PT LONG TERM GOAL #2   Title Pt will report his tiredness with walking is decreased by 50%    Time 8    Period Weeks    Status On-going      PT LONG TERM GOAL #3   Title Pt will increase # of reps of sit to stand to 10 reps in 30 seconds indicating an improvment in functional strength    Baseline 5 reps in 30 secons on 12/31/2019, 8 reps on 04/15/2020    Time 8    Period Weeks    Status Revised      PT LONG TERM GOAL #4   Title Pt will decrease normal TUG score  to < 12  seconds indicating an improvmemnt in functional mobility    Baseline 25.35 on 12/31/2019, 16.46. on 04/15/2020    Time 8    Period Weeks    Status Revised      PT LONG TERM GOAL #5   Title Pt will increae BERG balancer score to > 46 for moderate risk for fall    Baseline 01/07/20 score is 36 and < 36 is high risk for falls if walking without at device ( close to 100%), 04/15/2020 score is 41    Time 8    Period Weeks    Status On-going      PT LONG TERM GOAL #6   Title Pt will be able to do a floor to stand transfer with min assist    Time 8    Period Weeks    Status On-going                  Plan - 05/14/20 1405    Clinical Impression Statement Pt responded really well today to interval style of exercise with variety of weights, ball catch and throw, steps ups that he felt challenged him.  He also did much better in gait with 2 dowels/walking poles and recommended that pt consider getting them so he will be able to walk with better technique and speed and allow him to walk further distances.    Personal Factors and Comorbidities Age;Comorbidity 3+;Fitness;Past/Current Experience    Comorbidities a fib, recent sepsis, kidney injury and pneumonia, ongoing chemo, past back surgery    Examination-Activity Limitations Stand;Transfers;Stairs    Stability/Clinical Decision Making Evolving/Moderate complexity    Rehab Potential Good    PT Frequency 2x / week    PT Duration 8 weeks    PT Treatment/Interventions ADLs/Self Care Home Management;DME Instruction;Gait training;Stair training;Balance training;Therapeutic exercise;Therapeutic activities;Functional mobility training;Neuromuscular re-education;Cognitive remediation;Patient/family education;Manual lymph drainage;Orthotic Fit/Training;Manual techniques;Compression bandaging;Passive range of motion;Taping    PT Next Visit Plan recheck goals see how pt responded to interval exercises. and progress Measure feet. prn if the agree, teach wife MLD and bandaging to both feet to decrease edemal Gait training  change direction, change speeds, direction, etc, stair training.  Eventually do floor to stand transfers  Progress to UE and LE strengthening, then standing work for balance and strength and advanced gait. consider inteval training for strengthening           Patient will benefit from skilled therapeutic intervention in order to improve the following deficits and impairments:  Abnormal gait, Decreased balance, Decreased endurance, Decreased mobility, Difficulty walking, Impaired sensation, Impaired perceived functional ability,  Increased edema, Decreased knowledge of precautions, Decreased activity tolerance, Decreased strength, Pain, Postural dysfunction  Visit Diagnosis: Muscle weakness (generalized)  Difficulty in walking, not elsewhere classified  Abnormal posture  Repeated falls  Lymphedema, not elsewhere classified     Problem List Patient Active Problem List   Diagnosis Date Noted  . Closed fracture of left proximal humerus 01/15/2020  . Acute cystitis without hematuria 11/07/2019  . Left inguinal hernia 11/07/2019  . Physical deconditioning   . Depression with anxiety   . Acute respiratory failure with hypoxia ([place])   . Ischemic colitis ([place]) 09/18/2019  . Pressure injury of skin 09/16/2019  . Bloody diarrhea 09/14/2019  . Abdominal pain, diffuse 09/14/2019  . Acute metabolic encephalopathy 82/80/0349  . Goals of care, counseling/discussion 09/14/2019  . DNR (do not resuscitate) 09/14/2019  . Peripheral neuropathy 09/13/2019  . Gait disorder 09/13/2019  . Multiple  myeloma not having achieved remission ([place]) 05/17/2019  . Counseling regarding advance care planning and goals of care 05/17/2019  . Bone metastases ([place]) 05/17/2019  . Postoperative fever 05/09/2019  . S/P TAVR (transcatheter aortic valve replacement) 05/08/2019  . Multiple myeloma ([place]) 05/08/2019  . Acute on chronic diastolic heart failure ([place]) 05/08/2019  . Severe aortic stenosis   . Polymyalgia rheumatica syndrome ([place]) 01/29/2019  . CAD (coronary artery disease) 08/01/2018  . Hematochezia 07/27/2018  . HTN (hypertension) 02/28/2018  . Hypercholesteremia 02/28/2018  . Degeneration of lumbar intervertebral disc 12/06/2017  . Adenomatous polyp of ascending colon 12/02/2017  . Chronic low back pain 09/26/2017  . Mastoiditis 03/21/2017  . Abnormal TSH 12/23/2016  . PNA (pneumonia)   . AKI (acute kidney injury) ([place])   . Ascending aortic aneurysm ([place]) 02/15/2013  . Atrial fibrillation with RVR ([place]) 01/23/2013  .  Well adult exam 05/31/2012  . PAF (paroxysmal atrial fibrillation) ([place]) 08/05/2008  . HLD (hyperlipidemia) 04/08/2007  . Essential hypertension 04/08/2007   Donato Heinz. Owens Shark PT  Norwood Levo 05/14/2020, 2:10 PM  [place], [place], [address] Phone: [phone]   Fax:  [phone]  Name: [name] [name] MRN: [id number] Date of Birth: 18-Mar-1941

## 2020-05-20 ENCOUNTER — Encounter: Payer: Self-pay | Admitting: Internal Medicine

## 2020-05-20 ENCOUNTER — Encounter: Payer: Self-pay | Admitting: Physical Therapy

## 2020-05-20 ENCOUNTER — Ambulatory Visit: Payer: Medicare Other | Admitting: Physical Therapy

## 2020-05-20 ENCOUNTER — Ambulatory Visit (INDEPENDENT_AMBULATORY_CARE_PROVIDER_SITE_OTHER): Payer: Medicare Other | Admitting: Internal Medicine

## 2020-05-20 ENCOUNTER — Other Ambulatory Visit: Payer: Self-pay

## 2020-05-20 VITALS — BP 120/66 | HR 61 | Temp 97.9°F | Ht 71.0 in | Wt 175.0 lb

## 2020-05-20 DIAGNOSIS — I350 Nonrheumatic aortic (valve) stenosis: Secondary | ICD-10-CM | POA: Diagnosis not present

## 2020-05-20 DIAGNOSIS — R293 Abnormal posture: Secondary | ICD-10-CM | POA: Diagnosis not present

## 2020-05-20 DIAGNOSIS — I2510 Atherosclerotic heart disease of native coronary artery without angina pectoris: Secondary | ICD-10-CM

## 2020-05-20 DIAGNOSIS — M6281 Muscle weakness (generalized): Secondary | ICD-10-CM | POA: Diagnosis not present

## 2020-05-20 DIAGNOSIS — I10 Essential (primary) hypertension: Secondary | ICD-10-CM

## 2020-05-20 DIAGNOSIS — Z23 Encounter for immunization: Secondary | ICD-10-CM | POA: Diagnosis not present

## 2020-05-20 DIAGNOSIS — C7951 Secondary malignant neoplasm of bone: Secondary | ICD-10-CM | POA: Diagnosis not present

## 2020-05-20 DIAGNOSIS — Z952 Presence of prosthetic heart valve: Secondary | ICD-10-CM

## 2020-05-20 DIAGNOSIS — I890 Lymphedema, not elsewhere classified: Secondary | ICD-10-CM | POA: Diagnosis not present

## 2020-05-20 DIAGNOSIS — R269 Unspecified abnormalities of gait and mobility: Secondary | ICD-10-CM | POA: Diagnosis not present

## 2020-05-20 DIAGNOSIS — R262 Difficulty in walking, not elsewhere classified: Secondary | ICD-10-CM | POA: Diagnosis not present

## 2020-05-20 DIAGNOSIS — C9000 Multiple myeloma not having achieved remission: Secondary | ICD-10-CM

## 2020-05-20 DIAGNOSIS — R296 Repeated falls: Secondary | ICD-10-CM

## 2020-05-20 DIAGNOSIS — G622 Polyneuropathy due to other toxic agents: Secondary | ICD-10-CM

## 2020-05-20 DIAGNOSIS — R609 Edema, unspecified: Secondary | ICD-10-CM | POA: Insufficient documentation

## 2020-05-20 MED ORDER — AMLODIPINE BESYLATE 2.5 MG PO TABS
2.5000 mg | ORAL_TABLET | Freq: Every day | ORAL | 3 refills | Status: DC
Start: 2020-05-20 — End: 2020-08-20

## 2020-05-20 MED ORDER — OXYCODONE HCL 5 MG PO TABS: 5.0000 mg | ORAL_TABLET | Freq: Two times a day (BID) | ORAL | 0 refills | Status: DC | PRN

## 2020-05-20 NOTE — Assessment & Plan Note (Signed)
No CP 

## 2020-05-20 NOTE — Therapy (Signed)
[place], [place], [address] Phone: [phone]   Fax:  [phone]  Physical Therapy Treatment  Patient Details  Name: [name] [name] MRN: [id number] Date of Birth: 04/20/1941 Referring Provider (PT): Dr. Irene Limbo    Encounter Date: 05/20/2020   PT End of Session - 05/20/20 1824    Visit Number 8    Number of Visits 20    Date for PT Re-Evaluation 06/03/20    PT Start Time 1300    PT Stop Time 1345    PT Time Calculation (min) 45 min    Activity Tolerance Patient tolerated treatment well    Behavior During Therapy [facility] for tasks assessed/performed           Past Medical History:  Diagnosis Date  . Ascending aortic aneurysm ([place])   . Bicuspid aortic valve   . Cancer ([place])   . CHF NYHA class I (no symptoms from ordinary activities), acute, diastolic ([place])   . Dysrhythmia 2009   A fib  . Fatty liver    mild  . Fracture    left proximal humerus  . GERD (gastroesophageal reflux disease)   . GI bleeding 07/21/2018   post polypectomy  . Hemorrhoids   . HTN (hypertension)   . Hypercholesteremia   . Hypokalemia   . Internal hemorrhoids   . LBP (low back pain)   . Moderate aortic stenosis   . Osteoarthritis   . Paroxysmal atrial fibrillation ([place])    a. new onset Afib in 07/2008. He underwent ibutilide cardioversion successfully. b. Recurrence 01/2013 s/p TEE/DCCV - was on Xarelto but he stopped it as he was convinced it was causing joint pn. c. Recurrence 01/2016 - spont conv to NSR. Pt took Eliquis x1 mo then declined further anticoag. d. Recurrence 07/2016.  [name] [name] Pneumonia   . Tubular adenoma of colon     Past Surgical History:  Procedure Laterality Date  . BACK SURGERY  x12 years ago  . CARDIAC CATHETERIZATION  2020  . CARDIAC VALVE REPLACEMENT  2020  . CARDIOVERSION N/A 01/26/2013   Procedure: CARDIOVERSION;  Surgeon: Larey Dresser, MD;  Location: [facility] ENDOSCOPY;  Service: Cardiovascular;   Laterality: N/A;  . CARDIOVERSION N/A 10/28/2017   Procedure: CARDIOVERSION;  Surgeon: Larey Dresser, MD;  Location: [facility] ENDOSCOPY;  Service: Cardiovascular;  Laterality: N/A;  . CARDIOVERSION N/A 03/03/2018   Procedure: CARDIOVERSION;  Surgeon: Lelon Perla, MD;  Location: [facility] ENDOSCOPY;  Service: Cardiovascular;  Laterality: N/A;  . CARDIOVERSION N/A 09/19/2019   Procedure: CARDIOVERSION;  Surgeon: Larey Dresser, MD;  Location: [facility] ENDOSCOPY;  Service: Cardiovascular;  Laterality: N/A;  . COLONOSCOPY    . COLONOSCOPY  07/17/2018   at [facility]  . HEMORRHOID SURGERY    . INGUINAL HERNIA REPAIR Left 03/18/2020   Procedure: OPEN LEFT INGUINAL HERNIA REPAIR;  Surgeon: Alphonsa Overall, MD;  Location: [place];  Service: General;  Laterality: Left;  . LUMBAR LAMINECTOMY    . ORIF HUMERUS FRACTURE Left 01/15/2020   Procedure: OPEN REDUCTION INTERNAL FIXATION (ORIF) PROXIMAL HUMERUS FRACTURE;  Surgeon: Nicholes Stairs, MD;  Location: [place];  Service: Orthopedics;  Laterality: Left;  . POLYPECTOMY    . RIGHT HEART CATH N/A 08/20/2019   Procedure: RIGHT HEART CATH;  Surgeon: Larey Dresser, MD;  Location: [place] CV LAB;  Service: Cardiovascular;  Laterality: N/A;  . RIGHT/LEFT HEART CATH AND CORONARY ANGIOGRAPHY N/A 03/07/2019   Procedure: RIGHT/LEFT HEART CATH AND CORONARY ANGIOGRAPHY;  Surgeon: Burnell Blanks, MD;  Location: [place] CV LAB;  Service: Cardiovascular;  Laterality: N/A;  . [name] [name]  04/2019  . TEE WITHOUT CARDIOVERSION N/A 01/26/2013   Procedure: TRANSESOPHAGEAL ECHOCARDIOGRAM (TEE);  Surgeon: Larey Dresser, MD;  Location: [place];  Service: Cardiovascular;  Laterality: N/A;  . TEE WITHOUT CARDIOVERSION N/A 10/28/2017   Procedure: TRANSESOPHAGEAL ECHOCARDIOGRAM (TEE);  Surgeon: Larey Dresser, MD;  Location: [facility] ENDOSCOPY;  Service: Cardiovascular;  Laterality: N/A;  . TEE WITHOUT CARDIOVERSION N/A 05/08/2019   Procedure: TRANSESOPHAGEAL ECHOCARDIOGRAM (TEE);   Surgeon: Burnell Blanks, MD;  Location: [place] CV LAB;  Service: Open Heart Surgery;  Laterality: N/A;  . TEE WITHOUT CARDIOVERSION N/A 09/19/2019   Procedure: TRANSESOPHAGEAL ECHOCARDIOGRAM (TEE);  Surgeon: Larey Dresser, MD;  Location: [facility] ENDOSCOPY;  Service: Cardiovascular;  Laterality: N/A;  . TRANSCATHETER AORTIC VALVE REPLACEMENT, TRANSFEMORAL N/A 05/08/2019   Procedure: TRANSCATHETER AORTIC VALVE REPLACEMENT, TRANSFEMORAL;  Surgeon: Burnell Blanks, MD;  Location: [place] CV LAB;  Service: Open Heart Surgery;  Laterality: N/A;    There were no vitals filed for this visit.   Subjective Assessment - 05/20/20 1354    Subjective Pt said he was sore after last session.  He is still having problems with pain and swelling in both feet.  He comes wearing edemawear today. He does not want to do wrapping on his feet    Pertinent History During preliminary work up for valve replacement in July of 2020 it was discovered that pt had large lytic lesions in his pelvis due to  multiple myeloma.  He had a TAVR in August 2021  and started chemotherapy for multiple myeloma which was successful in reducing the lestions. However,  he developed peripheral neuropathy and chemotherapy is being adjusted. He has multiple hospitalizations in dec-jan 2021 for septic colitis with acute kidney injury and pneumonia with associated deconditioning and difficutly walking due to Chemotherapy induced peripheral neuropathy.  Pt  had home health PT and continues to walk with a cane. Past history also significant for afib and back surgery.  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    Patient Stated Goals To get relief with pain in feet and decrease swelling, get more stable  and walk without the came    Currently in Pain? Yes    Pain Score --   did not rate                            [facility] Adult PT Treatment/Exercise - 05/20/20 0001      Ambulation/Gait   Assistive device  Other (Comment)   2 dowel rods/walking poles    Gait Comments forwards, backwards, side stepping, sudden stops       Exercises   Exercises Ankle;Other Exercises    Other Exercises  used [employer] app on phone for audible cues of  20 seconds of exericse and 10 seconds of rest for 10 cycles: exercise:  sitting arm stretches, marching, biceps curls with 5 # in each hand, long arc quads with 3 pounds on each ankle supine for chest press up with 5 #, knee to chest, one arm shoulder raise with 5#, straight leg raise       Knee/Hip Exercises: Sidelying   Hip ABduction Strengthening;Both;10 reps    Other Sidelying Knee/Hip Exercises manual resistance for hip extension 10 reps on each leg. Pt with not c/o pain  Manual Therapy   Manual Therapy Edema management    Edema Management pt comes in wearing edemawear on right foot, but toes are visibly swollen, offerred to bandage toes, but pt does not want to do that. Showed pt other options to buy ankle compression from compression [employer], but pt wants to just wear edema wear and decided to buy another piece for his left leg                     PT Short Term Goals - 04/15/20 1925      PT SHORT TERM GOAL #1   Title Pt and wife feel that they are able to manage pt feet and ankle swelling at home    Time 4    Period Weeks    Status New      PT SHORT TERM GOAL #2   Title Pt will increase # of reps of sit to stand to 10 reps in 30 seconds indicating an improvment in functional strength    Baseline 5 reps in 30 seconds on 12/31/2019    Time 4    Period Weeks    Status Revised      PT SHORT TERM GOAL #3   Title Pt will decrease normal TUG score to < 14 seconds indicating an improvmemnt in functional mobility    Baseline 25.35 sec on 12/31/2019, 16.47 sec on 04/15/20    Time 4    Period Weeks    Status Revised             PT Long Term Goals - 04/15/20 1927      PT LONG TERM GOAL #1   Title Pt will report the pain in his legs is decreased  to 4/10    Baseline 6/10 on eval, 5/10 at 04/15/2020    Time 8    Period Weeks    Status On-going      PT LONG TERM GOAL #2   Title Pt will report his tiredness with walking is decreased by 50%    Time 8    Period Weeks    Status On-going      PT LONG TERM GOAL #3   Title Pt will increase # of reps of sit to stand to 10 reps in 30 seconds indicating an improvment in functional strength    Baseline 5 reps in 30 secons on 12/31/2019, 8 reps on 04/15/2020    Time 8    Period Weeks    Status Revised      PT LONG TERM GOAL #4   Title Pt will decrease normal TUG score to < 12  seconds indicating an improvmemnt in functional mobility    Baseline 25.35 on 12/31/2019, 16.46. on 04/15/2020    Time 8    Period Weeks    Status Revised      PT LONG TERM GOAL #5   Title Pt will increae BERG balancer score to > 46 for moderate risk for fall    Baseline 01/07/20 score is 36 and < 36 is high risk for falls if walking without at device ( close to 100%), 04/15/2020 score is 41    Time 8    Period Weeks    Status On-going      PT LONG TERM GOAL #6   Title Pt will be able to do a floor to stand transfer with min assist    Time 8    Period Weeks    Status On-going  Plan - 05/20/20 1825    Clinical Impression Statement Pt continues to complain of pain and swelling in his feet. He is wearing moccassins today with bare feet. He states he is not able to tolerate socks and does not want to have wrapping.  He does the exercises but does not think they are really "doing much"  Pt is still using his straight cane, though he walks better with better posture and speed with 2 walking poles. Pt wants to try using more gym equipment next session    Personal Factors and Comorbidities Age;Comorbidity 3+;Fitness;Past/Current Experience    Comorbidities a fib, recent sepsis, kidney injury and pneumonia, ongoing chemo, past back surgery    Examination-Activity Limitations Stand;Transfers;Stairs     Stability/Clinical Decision Making Evolving/Moderate complexity    Rehab Potential Good    PT Frequency 2x / week    PT Duration 8 weeks    PT Treatment/Interventions ADLs/Self Care Home Management;DME Instruction;Gait training;Stair training;Balance training;Therapeutic exercise;Therapeutic activities;Functional mobility training;Neuromuscular re-education;Cognitive remediation;Patient/family education;Manual lymph drainage;Orthotic Fit/Training;Manual techniques;Compression bandaging;Passive range of motion;Taping    PT Next Visit Plan recheck goals see how pt responded to interval exercises. and progress Measure feet. prn if the agree, teach wife MLD and bandaging to both feet to decrease edemal Gait training  change direction, change speeds, direction, etc, stair training.  Eventually do floor to stand transfers  Progress to UE and LE strengthening, then standing work for balance and strength and advanced gait. consider inteval training for strengthening    Consulted and Agree with Plan of Care Patient;Family member/caregiver           Patient will benefit from skilled therapeutic intervention in order to improve the following deficits and impairments:  Abnormal gait, Decreased balance, Decreased endurance, Decreased mobility, Difficulty walking, Impaired sensation, Impaired perceived functional ability, Increased edema, Decreased knowledge of precautions, Decreased activity tolerance, Decreased strength, Pain, Postural dysfunction  Visit Diagnosis: Muscle weakness (generalized)  Difficulty in walking, not elsewhere classified  Abnormal posture  Repeated falls  Lymphedema, not elsewhere classified     Problem List Patient Active Problem List   Diagnosis Date Noted  . Edema 05/20/2020  . Closed fracture of left proximal humerus 01/15/2020  . Acute cystitis without hematuria 11/07/2019  . Left inguinal hernia 11/07/2019  . Physical deconditioning   . Depression with anxiety   .  Acute respiratory failure with hypoxia ([place])   . Ischemic colitis ([place]) 09/18/2019  . Pressure injury of skin 09/16/2019  . Bloody diarrhea 09/14/2019  . Abdominal pain, diffuse 09/14/2019  . Acute metabolic encephalopathy 30/86/5784  . Goals of care, counseling/discussion 09/14/2019  . DNR (do not resuscitate) 09/14/2019  . Peripheral neuropathy 09/13/2019  . Gait disorder 09/13/2019  . Multiple myeloma not having achieved remission ([place]) 05/17/2019  . Counseling regarding advance care planning and goals of care 05/17/2019  . Bone metastases ([place]) 05/17/2019  . Postoperative fever 05/09/2019  . S/P TAVR (transcatheter aortic valve replacement) 05/08/2019  . Multiple myeloma ([place]) 05/08/2019  . Acute on chronic diastolic heart failure ([place]) 05/08/2019  . Severe aortic stenosis   . CAD (coronary artery disease) 08/01/2018  . Hematochezia 07/27/2018  . HTN (hypertension) 02/28/2018  . Hypercholesteremia 02/28/2018  . Degeneration of lumbar intervertebral disc 12/06/2017  . Adenomatous polyp of ascending colon 12/02/2017  . Chronic low back pain 09/26/2017  . Mastoiditis 03/21/2017  . Abnormal TSH 12/23/2016  . PNA (pneumonia)   . AKI (acute kidney injury) ([place])   . Ascending aortic aneurysm ([place])  02/15/2013  . Atrial fibrillation with RVR ([place]) 01/23/2013  . Well adult exam 05/31/2012  . PAF (paroxysmal atrial fibrillation) ([place]) 08/05/2008  . HLD (hyperlipidemia) 04/08/2007  . Essential hypertension 04/08/2007   Donato Heinz. Owens Shark PT  Norwood Levo 05/20/2020, 6:29 PM  [place], [place], [address] Phone: [phone]   Fax:  [phone]  Name: Amariyon Maynes MRN: [id number] Date of Birth: 22-Jul-1941

## 2020-05-20 NOTE — Assessment & Plan Note (Signed)
Dr. Irene Limbo

## 2020-05-20 NOTE — Patient Instructions (Addendum)
Reduce or stop Amlodipine Lion's mane mushroom for memory

## 2020-05-20 NOTE — Progress Notes (Signed)
Subjective:  Patient ID: [name] [name], male    DOB: 02-12-1941  Age: 79 y.o. MRN: [id number]  CC: No chief complaint on file.   HPI [name] [name] presents for A fib, MM, AS - s/p TAVR C/o neuropathy - bad pains. Oxy helps better  Outpatient Medications Prior to Visit  Medication Sig Dispense Refill  . acyclovir (ZOVIRAX) 400 MG tablet Take 1 tablet (400 mg total) by mouth 2 (two) times daily. 180 tablet 3  . amLODipine (NORVASC) 5 MG tablet TAKE 1 TABLET BY MOUTH EVERY DAY 90 tablet 3  . apixaban (ELIQUIS) 5 MG TABS tablet Take 5 mg by mouth 2 (two) times daily.    [name] b complex vitamins tablet Take 1 tablet by mouth daily. 100 tablet 3  . Calcium-Magnesium 500-250 MG TABS Take 1 tablet by mouth daily.    . Carboxymethylcellul-Glycerin (LUBRICATING EYE DROPS OP) Place 1 drop into both eyes daily as needed (dry eyes).    . Cholecalciferol (VITAMIN D) 50 MCG (2000 UT) tablet Take 2,000 Units by mouth daily.    [name] dexamethasone (DECADRON) 4 MG tablet Take 12 mg by mouth once a week. On tuesdays    . diclofenac Sodium (VOLTAREN) 1 % GEL Apply 1 application topically 2 (two) times daily as needed (pain.).    [name] DULoxetine (CYMBALTA) 60 MG capsule TAKE 1 CAPSULE BY MOUTH EVERY DAY 90 capsule 1  . famotidine (PEPCID) 40 MG tablet Take 40 mg by mouth daily.    . furosemide (LASIX) 20 MG tablet Take 20 mg by mouth every other day.    . gabapentin (NEURONTIN) 300 MG capsule Take 1 capsule (300 mg total) by mouth 3 (three) times daily.    [name] LORazepam (ATIVAN) 0.5 MG tablet TAKE 1 TABLET (0.5 MG TOTAL) BY MOUTH EVERY 8 (EIGHT) HOURS AS NEEDED (NAUSEA OR VOMITING). 60 tablet 1  . metoprolol succinate (TOPROL-XL) 25 MG 24 hr tablet Take 25 mg by mouth 2 (two) times daily.    [name] oxyCODONE (OXY IR/ROXICODONE) 5 MG immediate release tablet Take 2 tablets (10 mg total) by mouth every 4 (four) hours as needed for moderate pain or severe pain. 40 tablet 0  . polyethylene glycol powder (GLYCOLAX/MIRALAX) 17  GM/SCOOP powder Take 17-34 g by mouth 2 (two) times daily as needed for moderate constipation. 500 g 5  . senna-docusate (SENOKOT-S) 8.6-50 MG tablet Take 2 tablets by mouth at bedtime.    [name] spironolactone (ALDACTONE) 25 MG tablet Take 1 tablet (25 mg total) by mouth daily. 30 tablet 3  . Vitamin D, Ergocalciferol, (DRISDOL) 1.25 MG (50000 UNIT) CAPS capsule TAKE 1 CAPSULE BY MOUTH ONE TIME PER WEEK 12 capsule 3   No facility-administered medications prior to visit.    ROS: Review of Systems  Constitutional: Positive for fatigue. Negative for appetite change and unexpected weight change.  HENT: Negative for congestion, nosebleeds, sneezing, sore throat and trouble swallowing.   Eyes: Negative for itching and visual disturbance.  Respiratory: Negative for cough.   Cardiovascular: Negative for chest pain, palpitations and leg swelling.  Gastrointestinal: Negative for abdominal distention, blood in stool, diarrhea and nausea.  Genitourinary: Negative for frequency and hematuria.  Musculoskeletal: Positive for back pain and gait problem. Negative for joint swelling and neck pain.  Skin: Negative for rash.  Neurological: Positive for dizziness, weakness and numbness. Negative for tremors and speech difficulty.  Psychiatric/Behavioral: Positive for decreased concentration. Negative for agitation, dysphoric mood and sleep disturbance. The patient is not nervous/anxious.  Objective:  There were no vitals taken for this visit.  BP Readings from Last 3 Encounters:  05/07/20 120/62  04/25/20 120/80  04/08/20 120/69    Wt Readings from Last 3 Encounters:  05/07/20 168 lb 9.6 oz (76.5 kg)  04/25/20 172 lb (78 kg)  04/08/20 166 lb 11.2 oz (75.6 kg)    Physical Exam Constitutional:      General: He is not in acute distress.    Appearance: He is well-developed.     Comments: NAD  Eyes:     Conjunctiva/sclera: Conjunctivae normal.     Pupils: Pupils are equal, round, and reactive to  light.  Neck:     Thyroid: No thyromegaly.     Vascular: No JVD.  Cardiovascular:     Rate and Rhythm: Normal rate and regular rhythm.     Heart sounds: Normal heart sounds. No murmur heard.  No friction rub. No gallop.   Pulmonary:     Effort: Pulmonary effort is normal. No respiratory distress.     Breath sounds: Normal breath sounds. No wheezing or rales.  Chest:     Chest wall: No tenderness.  Abdominal:     General: Bowel sounds are normal. There is no distension.     Palpations: Abdomen is soft. There is no mass.     Tenderness: There is no abdominal tenderness. There is no guarding or rebound.  Musculoskeletal:        General: Swelling present. No tenderness. Normal range of motion.     Cervical back: Normal range of motion.  Lymphadenopathy:     Cervical: No cervical adenopathy.  Skin:    General: Skin is warm and dry.     Findings: No rash.  Neurological:     Mental Status: He is alert and oriented to person, place, and time.     Cranial Nerves: No cranial nerve deficit.     Motor: No abnormal muscle tone.     Coordination: Coordination abnormal.     Gait: Gait abnormal.     Deep Tendon Reflexes: Reflexes are normal and symmetric.  Psychiatric:        Behavior: Behavior normal.        Thought Content: Thought content normal.        Judgment: Judgment normal.   feet edema 1+ Cane Reduce or stop Amlodipine  Lab Results  Component Value Date   WBC 9.4 04/08/2020   HGB 13.3 04/08/2020   HCT 41.7 04/08/2020   PLT 130 (L) 04/08/2020   GLUCOSE 110 (H) 04/25/2020   CHOL 144 08/01/2018   TRIG 167.0 (H) 08/01/2018   HDL 28.20 (L) 08/01/2018   LDLDIRECT 151.5 11/20/2007   LDLCALC 82 08/01/2018   ALT 58 (H) 04/25/2020   AST 41 04/25/2020   NA 141 04/25/2020   K 4.1 04/25/2020   CL 109 04/25/2020   CREATININE 1.25 (H) 04/25/2020   BUN 27 (H) 04/25/2020   CO2 24 04/25/2020   TSH 7.975 (H) 04/25/2020   PSA 4.20 (H) 08/01/2018   INR 1.2 01/07/2020   HGBA1C 5.8  (H) 05/04/2019    No results found.  Assessment & Plan:    [name] Kehr, MD

## 2020-05-20 NOTE — Assessment & Plan Note (Signed)
Oxycodone prn  Potential benefits of a long term opioids use as well as potential risks (i.e. addiction risk, apnea etc) and complications (i.e. Somnolence, constipation and others) were explained to the patient and were aknowledged. 

## 2020-05-20 NOTE — Assessment & Plan Note (Signed)
Reduce or stop Amlodipine

## 2020-05-20 NOTE — Assessment & Plan Note (Addendum)
S/p TAVR 04/2019

## 2020-05-20 NOTE — Assessment & Plan Note (Signed)
S/p TAVR 04/2019

## 2020-05-20 NOTE — Assessment & Plan Note (Signed)
Metoprolol, Hydrodiuril

## 2020-05-20 NOTE — Assessment & Plan Note (Signed)
.  inpt

## 2020-05-21 ENCOUNTER — Ambulatory Visit: Payer: Medicare Other | Admitting: Physical Therapy

## 2020-05-21 DIAGNOSIS — R293 Abnormal posture: Secondary | ICD-10-CM | POA: Diagnosis not present

## 2020-05-21 DIAGNOSIS — M6281 Muscle weakness (generalized): Secondary | ICD-10-CM | POA: Diagnosis not present

## 2020-05-21 DIAGNOSIS — R296 Repeated falls: Secondary | ICD-10-CM

## 2020-05-21 DIAGNOSIS — I890 Lymphedema, not elsewhere classified: Secondary | ICD-10-CM | POA: Diagnosis not present

## 2020-05-21 DIAGNOSIS — R262 Difficulty in walking, not elsewhere classified: Secondary | ICD-10-CM | POA: Diagnosis not present

## 2020-05-21 NOTE — Therapy (Signed)
[place], [place], [address] Phone: [phone]   Fax:  [phone]  Physical Therapy Treatment  Patient Details  Name: [name] [name] MRN: [id number] Date of Birth: December 05, 1940 Referring Provider (PT): Dr. Irene Limbo    Encounter Date: 05/21/2020   PT End of Session - 05/21/20 1628    Visit Number 9    Number of Visits 20    Date for PT Re-Evaluation 06/03/20    PT Start Time 1200    PT Stop Time 1245    PT Time Calculation (min) 45 min    Activity Tolerance Patient tolerated treatment well    Behavior During Therapy [facility] for tasks assessed/performed           Past Medical History:  Diagnosis Date  . Ascending aortic aneurysm ([place])   . Bicuspid aortic valve   . Cancer ([place])   . CHF NYHA class I (no symptoms from ordinary activities), acute, diastolic ([place])   . Dysrhythmia 2009   A fib  . Fatty liver    mild  . Fracture    left proximal humerus  . GERD (gastroesophageal reflux disease)   . GI bleeding 07/21/2018   post polypectomy  . Hemorrhoids   . HTN (hypertension)   . Hypercholesteremia   . Hypokalemia   . Internal hemorrhoids   . LBP (low back pain)   . Moderate aortic stenosis   . Osteoarthritis   . Paroxysmal atrial fibrillation ([place])    a. new onset Afib in 07/2008. He underwent ibutilide cardioversion successfully. b. Recurrence 01/2013 s/p TEE/DCCV - was on Xarelto but he stopped it as he was convinced it was causing joint pn. c. Recurrence 01/2016 - spont conv to NSR. Pt took Eliquis x1 mo then declined further anticoag. d. Recurrence 07/2016.  [name] Pneumonia   . Tubular adenoma of colon     Past Surgical History:  Procedure Laterality Date  . BACK SURGERY  x12 years ago  . CARDIAC CATHETERIZATION  2020  . CARDIAC VALVE REPLACEMENT  2020  . CARDIOVERSION N/A 01/26/2013   Procedure: CARDIOVERSION;  Surgeon: Larey Dresser, MD;  Location: [facility] ENDOSCOPY;  Service: Cardiovascular;   Laterality: N/A;  . CARDIOVERSION N/A 10/28/2017   Procedure: CARDIOVERSION;  Surgeon: Larey Dresser, MD;  Location: [facility] ENDOSCOPY;  Service: Cardiovascular;  Laterality: N/A;  . CARDIOVERSION N/A 03/03/2018   Procedure: CARDIOVERSION;  Surgeon: Lelon Perla, MD;  Location: [facility] ENDOSCOPY;  Service: Cardiovascular;  Laterality: N/A;  . CARDIOVERSION N/A 09/19/2019   Procedure: CARDIOVERSION;  Surgeon: Larey Dresser, MD;  Location: [facility] ENDOSCOPY;  Service: Cardiovascular;  Laterality: N/A;  . COLONOSCOPY    . COLONOSCOPY  07/17/2018   at [facility]  . HEMORRHOID SURGERY    . INGUINAL HERNIA REPAIR Left 03/18/2020   Procedure: OPEN LEFT INGUINAL HERNIA REPAIR;  Surgeon: Alphonsa Overall, MD;  Location: [place];  Service: General;  Laterality: Left;  . LUMBAR LAMINECTOMY    . ORIF HUMERUS FRACTURE Left 01/15/2020   Procedure: OPEN REDUCTION INTERNAL FIXATION (ORIF) PROXIMAL HUMERUS FRACTURE;  Surgeon: Nicholes Stairs, MD;  Location: [place];  Service: Orthopedics;  Laterality: Left;  . POLYPECTOMY    . RIGHT HEART CATH N/A 08/20/2019   Procedure: RIGHT HEART CATH;  Surgeon: Larey Dresser, MD;  Location: [place] CV LAB;  Service: Cardiovascular;  Laterality: N/A;  . RIGHT/LEFT HEART CATH AND CORONARY ANGIOGRAPHY N/A 03/07/2019   Procedure: RIGHT/LEFT HEART CATH AND CORONARY ANGIOGRAPHY;  Surgeon: Burnell Blanks, MD;  Location: [place] CV LAB;  Service: Cardiovascular;  Laterality: N/A;  . [name] [name]  04/2019  . TEE WITHOUT CARDIOVERSION N/A 01/26/2013   Procedure: TRANSESOPHAGEAL ECHOCARDIOGRAM (TEE);  Surgeon: Larey Dresser, MD;  Location: [place];  Service: Cardiovascular;  Laterality: N/A;  . TEE WITHOUT CARDIOVERSION N/A 10/28/2017   Procedure: TRANSESOPHAGEAL ECHOCARDIOGRAM (TEE);  Surgeon: Larey Dresser, MD;  Location: [facility] ENDOSCOPY;  Service: Cardiovascular;  Laterality: N/A;  . TEE WITHOUT CARDIOVERSION N/A 05/08/2019   Procedure: TRANSESOPHAGEAL ECHOCARDIOGRAM (TEE);   Surgeon: Burnell Blanks, MD;  Location: [place] CV LAB;  Service: Open Heart Surgery;  Laterality: N/A;  . TEE WITHOUT CARDIOVERSION N/A 09/19/2019   Procedure: TRANSESOPHAGEAL ECHOCARDIOGRAM (TEE);  Surgeon: Larey Dresser, MD;  Location: [facility] ENDOSCOPY;  Service: Cardiovascular;  Laterality: N/A;  . TRANSCATHETER AORTIC VALVE REPLACEMENT, TRANSFEMORAL N/A 05/08/2019   Procedure: TRANSCATHETER AORTIC VALVE REPLACEMENT, TRANSFEMORAL;  Surgeon: Burnell Blanks, MD;  Location: [place] CV LAB;  Service: Open Heart Surgery;  Laterality: N/A;    There were no vitals filed for this visit.   Subjective Assessment - 05/21/20 1201    Subjective Pt comes in today wearing his edemawear on his right leg and tennis  shoes    Patient is accompained by: Family member    Pertinent History During preliminary work up for valve replacement in July of 2020 it was discovered that pt had large lytic lesions in his pelvis due to  multiple myeloma.  He had a TAVR in August 2021  and started chemotherapy for multiple myeloma which was successful in reducing the lestions. However,  he developed peripheral neuropathy and chemotherapy is being adjusted. He has multiple hospitalizations in dec-jan 2021 for septic colitis with acute kidney injury and pneumonia with associated deconditioning and difficutly walking due to Chemotherapy induced peripheral neuropathy.  Pt  had home health PT and continues to walk with a cane. Past history also significant for afib and back surgery.  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    Patient Stated Goals To get relief with pain in feet and decrease swelling, get more stable  and walk without the came    Currently in Pain? Yes    Pain Score 7     Pain Location Foot    Pain Orientation Right;Left    Pain Descriptors / Indicators Burning    Pain Type Neuropathic pain    Pain Onset More than a month ago    Pain Frequency Constant    Aggravating Factors  worse  with walking, last night it woke him up    Pain Relieving Factors massage                             [facility] Adult PT Treatment/Exercise - 05/21/20 0001      Transfers   Comments pt crawled onto mat onto hands and knees and was able to turn over and get to sitting on side of mat in preparation for working on floor to stand transferes       Ambulation/Gait   Ambulation/Gait Yes    Ambulation/Gait Assistance 5: Supervision    Ambulation/Gait Assistance Details at times pt walked without device     Ambulation Distance (Feet) 500 Feet    Assistive device Other (Comment)   2 dowel rods/walking poles    Gait Comments pt went up and down 2 heights of  stairs with one hanrail for support       High Level Balance   High Level Balance Activities Side stepping;Backward walking;Direction changes;Turns;Sudden stops;Marching forwards    High Level Balance Comments in parrallel bars       Exercises   Exercises Shoulder;Knee/Hip;Other Exercises    Other Exercises  hand on high mat and step up to step stool and raise body on it  as prep work for floor to stand transfers       Knee/Hip Exercises: Machines for Strengthening   Cybex Knee Extension 2 sets of 10 at 15 pounds one set of 10 at 25 pounds       Shoulder Exercises: Seated   Other Seated Exercises chest press machine at 10 pounds for 10 reps       Shoulder Exercises: Standing   Other Standing Exercises bilateral row with cable machine x 10 reps, the 10 reps unilateral,then 10 reps triceps press                     PT Short Term Goals - 05/21/20 1203      PT SHORT TERM GOAL #1   Title Pt and wife feel that they are able to manage pt feet and ankle swelling at home    Baseline wife feels she she knows what to do, pt feels he is still working on it    Status On-going      PT Cashion #2   Title Pt will increase # of reps of sit to stand to 10 reps in 30 seconds indicating an improvment in functional  strength    Baseline 5 reps in 30 seconds on 12/31/2019, 10 reps in 30 seconds without use of hands    Status Achieved      PT SHORT TERM GOAL #3   Title Pt will decrease normal TUG score to < 14 seconds indicating an improvmemnt in functional mobility    Baseline 25.35 sec on 12/31/2019, 16.47 sec on 04/15/20, 05/21/2020 10.42 sec    Status Achieved             PT Long Term Goals - 05/21/20 1211      PT LONG TERM GOAL #1   Title Pt will report the pain in his legs is decreased to 4/10    Baseline 6/10 on eval, 5/10 at 04/15/2020, 05/21/2020 pain is 7/10    Time 8    Period Weeks    Status On-going      PT LONG TERM GOAL #2   Title Pt will report his tiredness with walking is decreased by 50%    Baseline 05/21/2020.  Pt is improved by about 30%    Time 8    Period Weeks    Status On-going      PT LONG TERM GOAL #3   Title Pt will increase # of reps of sit to stand to 10 reps in 30 seconds indicating an improvment in functional strength    Baseline 5 reps in 30 secons on 12/31/2019, 8 reps on 04/15/2020, 10 reps on 05/21/2020    Status Achieved      PT LONG TERM GOAL #4   Title Pt will decrease normal TUG score to < 12  seconds indicating an improvmemnt in functional mobility    Baseline 25.35 on 12/31/2019, 16.46. on 04/15/2020, 10 42 on 9/82021    Status Achieved      PT LONG TERM GOAL #5   Title Pt  will increae BERG balancer score to > 46 for moderate risk for fall    Baseline 01/07/20 score is 36 and < 36 is high risk for falls if walking without at device ( close to 100%), 04/15/2020 score is 41    Status On-going      PT LONG TERM GOAL #6   Title Pt will be able to do a floor to stand transfer with min assist    Time 8    Period Weeks    Status On-going                 Plan - 05/21/20 1628    Clinical Impression Statement Pt has made significant progress toward most goals with increase in strength and functional mobility.  He did well with strengthening machines today. He  is ready to continue to progress with strengthening and work on floor to stand transfers    Personal Factors and Comorbidities Age;Comorbidity 3+;Fitness;Past/Current Experience    Comorbidities a fib, recent sepsis, kidney injury and pneumonia, ongoing chemo, past back surgery    Examination-Activity Limitations Stand;Transfers;Stairs    Stability/Clinical Decision Making Evolving/Moderate complexity    Rehab Potential Good    PT Frequency 2x / week    PT Duration 8 weeks    PT Next Visit Plan redo [place] .  continue strengthening and work on floor to stand transfers  Monitor  feet. prn  Gait training  change direction, change speeds, direction, etc, stair training.  Progress to UE and LE strengthening, then standing work for balance and strength and advanced gait. consider inteval training for strengthening    Consulted and Agree with Plan of Care Patient           Patient will benefit from skilled therapeutic intervention in order to improve the following deficits and impairments:  Abnormal gait, Decreased balance, Decreased endurance, Decreased mobility, Difficulty walking, Impaired sensation, Impaired perceived functional ability, Increased edema, Decreased knowledge of precautions, Decreased activity tolerance, Decreased strength, Pain, Postural dysfunction  Visit Diagnosis: Muscle weakness (generalized)  Difficulty in walking, not elsewhere classified  Abnormal posture  Repeated falls  Lymphedema, not elsewhere classified     Problem List Patient Active Problem List   Diagnosis Date Noted  . Edema 05/20/2020  . Closed fracture of left proximal humerus 01/15/2020  . Acute cystitis without hematuria 11/07/2019  . Left inguinal hernia 11/07/2019  . Physical deconditioning   . Depression with anxiety   . Acute respiratory failure with hypoxia ([place])   . Ischemic colitis ([place]) 09/18/2019  . Pressure injury of skin 09/16/2019  . Bloody diarrhea 09/14/2019  . Abdominal pain,  diffuse 09/14/2019  . Acute metabolic encephalopathy 35/45/6256  . Goals of care, counseling/discussion 09/14/2019  . DNR (do not resuscitate) 09/14/2019  . Peripheral neuropathy 09/13/2019  . Gait disorder 09/13/2019  . Multiple myeloma not having achieved remission ([place]) 05/17/2019  . Counseling regarding advance care planning and goals of care 05/17/2019  . Bone metastases ([place]) 05/17/2019  . Postoperative fever 05/09/2019  . S/P TAVR (transcatheter aortic valve replacement) 05/08/2019  . Multiple myeloma ([place]) 05/08/2019  . Acute on chronic diastolic heart failure ([place]) 05/08/2019  . Severe aortic stenosis   . CAD (coronary artery disease) 08/01/2018  . Hematochezia 07/27/2018  . HTN (hypertension) 02/28/2018  . Hypercholesteremia 02/28/2018  . Degeneration of lumbar intervertebral disc 12/06/2017  . Adenomatous polyp of ascending colon 12/02/2017  . Chronic low back pain 09/26/2017  . Mastoiditis 03/21/2017  . Abnormal TSH 12/23/2016  .  PNA (pneumonia)   . AKI (acute kidney injury) ([place])   . Ascending aortic aneurysm ([place]) 02/15/2013  . Atrial fibrillation with RVR ([place]) 01/23/2013  . Well adult exam 05/31/2012  . PAF (paroxysmal atrial fibrillation) ([place]) 08/05/2008  . HLD (hyperlipidemia) 04/08/2007  . Essential hypertension 04/08/2007   Donato Heinz. Owens Shark PT  Norwood Levo 05/21/2020, 4:32 PM  [place], [place], [address] Phone: [phone]   Fax:  [phone]  Name: Kaisyn Millea MRN: [id number] Date of Birth: [birth year]

## 2020-05-27 ENCOUNTER — Encounter: Payer: Self-pay | Admitting: Physical Therapy

## 2020-05-27 ENCOUNTER — Other Ambulatory Visit: Payer: Self-pay

## 2020-05-27 ENCOUNTER — Ambulatory Visit: Payer: Medicare Other | Admitting: Physical Therapy

## 2020-05-27 DIAGNOSIS — R262 Difficulty in walking, not elsewhere classified: Secondary | ICD-10-CM | POA: Diagnosis not present

## 2020-05-27 DIAGNOSIS — M6281 Muscle weakness (generalized): Secondary | ICD-10-CM

## 2020-05-27 DIAGNOSIS — R296 Repeated falls: Secondary | ICD-10-CM | POA: Diagnosis not present

## 2020-05-27 DIAGNOSIS — I890 Lymphedema, not elsewhere classified: Secondary | ICD-10-CM

## 2020-05-27 DIAGNOSIS — R293 Abnormal posture: Secondary | ICD-10-CM

## 2020-05-27 NOTE — Therapy (Signed)
[place], [place], [address] Phone: [phone]   Fax:  [phone]  Physical Therapy Treatment  Patient Details  Name: [name] [name] MRN: [id number] Date of Birth: [birth year] Referring Provider (PT): Dr. Irene Limbo    Progress Note Reporting Period 04/15/2020 to 05/27/2020  See note below for Objective Data and Assessment of Progress/Goals.        Encounter Date: 05/27/2020   PT End of Session - 05/27/20 1859    Visit Number 10    Number of Visits 20    Date for PT Re-Evaluation 06/03/20    PT Start Time 1300    PT Stop Time 1355    PT Time Calculation (min) 55 min    Activity Tolerance Patient tolerated treatment well    Behavior During Therapy [facility] for tasks assessed/performed           Past Medical History:  Diagnosis Date  . Ascending aortic aneurysm ([place])   . Bicuspid aortic valve   . Cancer ([place])   . CHF NYHA class I (no symptoms from ordinary activities), acute, diastolic ([place])   . Dysrhythmia 2009   A fib  . Fatty liver    mild  . Fracture    left proximal humerus  . GERD (gastroesophageal reflux disease)   . GI bleeding 07/21/2018   post polypectomy  . Hemorrhoids   . HTN (hypertension)   . Hypercholesteremia   . Hypokalemia   . Internal hemorrhoids   . LBP (low back pain)   . Moderate aortic stenosis   . Osteoarthritis   . Paroxysmal atrial fibrillation ([place])    a. new onset Afib in 07/2008. He underwent ibutilide cardioversion successfully. b. Recurrence 01/2013 s/p TEE/DCCV - was on Xarelto but he stopped it as he was convinced it was causing joint pn. c. Recurrence 01/2016 - spont conv to NSR. Pt took Eliquis x1 mo then declined further anticoag. d. Recurrence 07/2016.  [name] Pneumonia   . Tubular adenoma of colon     Past Surgical History:  Procedure Laterality Date  . BACK SURGERY  x12 years ago  . CARDIAC CATHETERIZATION  2020  . CARDIAC VALVE REPLACEMENT  2020  .  CARDIOVERSION N/A 01/26/2013   Procedure: CARDIOVERSION;  Surgeon: Larey Dresser, MD;  Location: [facility] ENDOSCOPY;  Service: Cardiovascular;  Laterality: N/A;  . CARDIOVERSION N/A 10/28/2017   Procedure: CARDIOVERSION;  Surgeon: Larey Dresser, MD;  Location: [facility] ENDOSCOPY;  Service: Cardiovascular;  Laterality: N/A;  . CARDIOVERSION N/A 03/03/2018   Procedure: CARDIOVERSION;  Surgeon: Lelon Perla, MD;  Location: [facility] ENDOSCOPY;  Service: Cardiovascular;  Laterality: N/A;  . CARDIOVERSION N/A 09/19/2019   Procedure: CARDIOVERSION;  Surgeon: Larey Dresser, MD;  Location: [facility] ENDOSCOPY;  Service: Cardiovascular;  Laterality: N/A;  . COLONOSCOPY    . COLONOSCOPY  07/17/2018   at [facility]  . HEMORRHOID SURGERY    . INGUINAL HERNIA REPAIR Left 03/18/2020   Procedure: OPEN LEFT INGUINAL HERNIA REPAIR;  Surgeon: Alphonsa Overall, MD;  Location: [place];  Service: General;  Laterality: Left;  . LUMBAR LAMINECTOMY    . ORIF HUMERUS FRACTURE Left 01/15/2020   Procedure: OPEN REDUCTION INTERNAL FIXATION (ORIF) PROXIMAL HUMERUS FRACTURE;  Surgeon: Nicholes Stairs, MD;  Location: [place];  Service: Orthopedics;  Laterality: Left;  . POLYPECTOMY    . RIGHT HEART CATH N/A 08/20/2019   Procedure: RIGHT HEART CATH;  Surgeon: Larey Dresser, MD;  Location: [place] CV LAB;  Service: Cardiovascular;  Laterality: N/A;  . RIGHT/LEFT HEART CATH AND CORONARY ANGIOGRAPHY N/A 03/07/2019   Procedure: RIGHT/LEFT HEART CATH AND CORONARY ANGIOGRAPHY;  Surgeon: Burnell Blanks, MD;  Location: [place] CV LAB;  Service: Cardiovascular;  Laterality: N/A;  . [name]  04/2019  . TEE WITHOUT CARDIOVERSION N/A 01/26/2013   Procedure: TRANSESOPHAGEAL ECHOCARDIOGRAM (TEE);  Surgeon: Larey Dresser, MD;  Location: [place];  Service: Cardiovascular;  Laterality: N/A;  . TEE WITHOUT CARDIOVERSION N/A 10/28/2017   Procedure: TRANSESOPHAGEAL ECHOCARDIOGRAM (TEE);  Surgeon: Larey Dresser, MD;  Location: [facility] ENDOSCOPY;   Service: Cardiovascular;  Laterality: N/A;  . TEE WITHOUT CARDIOVERSION N/A 05/08/2019   Procedure: TRANSESOPHAGEAL ECHOCARDIOGRAM (TEE);  Surgeon: Burnell Blanks, MD;  Location: [place] CV LAB;  Service: Open Heart Surgery;  Laterality: N/A;  . TEE WITHOUT CARDIOVERSION N/A 09/19/2019   Procedure: TRANSESOPHAGEAL ECHOCARDIOGRAM (TEE);  Surgeon: Larey Dresser, MD;  Location: [facility] ENDOSCOPY;  Service: Cardiovascular;  Laterality: N/A;  . TRANSCATHETER AORTIC VALVE REPLACEMENT, TRANSFEMORAL N/A 05/08/2019   Procedure: TRANSCATHETER AORTIC VALVE REPLACEMENT, TRANSFEMORAL;  Surgeon: Burnell Blanks, MD;  Location: [place] CV LAB;  Service: Open Heart Surgery;  Laterality: N/A;    There were no vitals filed for this visit.   Subjective Assessment - 05/27/20 1310    Subjective Pt states he walked for 45 minutes yesterday and that his leg almost gave out 3 times.  Pt expresses frustration that he still so much pain in his legs and that he cannot do what he wants to do  He says 'yes and no' about doing a regular exercise program. He said that he was tired and had some muscle soreness his legs .  Talked with pt about setting a regular exercise every day.                             [place] Adult PT Treatment/Exercise - 05/27/20 0001      Transfers   Comments pt crawled onto mat but was not able to stay up in hands and knees and start to crawl on hand and knees,  He lowered down onto prone position and was able to roll onto his side and get up into sitting       Knee/Hip Exercises: Standing   Other Standing Knee Exercises standing in front of mat with hands on mat. pt lowered down onto  10 inch step wiht airex mat on top  and did one legged squat with other leg to build strength to use for functional activity for floor to stand transfer. x 10 reps, then same with other leg       Knee/Hip Exercises: Supine   Straight Leg Raises Strengthening;Both;10 reps    Other  Supine Knee/Hip Exercises one leg to tabletop, then other then one down, then the other x 10 times       Shoulder Exercises: Supine   Horizontal ABduction Strengthening;Right;Left;5 reps;Theraband    Theraband Level (Shoulder Horizontal ABduction) Level 2 (Red)    External Rotation Strengthening;Right;Left;5 reps;Theraband    Theraband Level (Shoulder External Rotation) Level 2 (Red)    Flexion Strengthening;Right;Left;5 reps;Theraband   wide and narrow grip    Theraband Level (Shoulder Flexion) Level 2 (Red)    Diagonals Strengthening;Right;Left;5 reps;Theraband    Theraband Level (Shoulder Diagonals) Level 2 (Red)    Other Supine Exercises had both legs up on bolster for elevation while doing this.  edema visible reduced  in 15 minutes.  Instructed pt and wife to do this at home as needed     Other Supine Exercises Issued above theraband exercise to wife's email as medbridge video                   PT Education - 05/27/20 1859    Education Details supine scapular series with red theraband    Person(s) Educated Patient;Spouse    Methods Explanation;Demonstration;Handout   Medbridge sent to [employer] email   Comprehension Verbalized understanding;Returned demonstration            PT Short Term Goals - 05/21/20 1203      PT SHORT TERM GOAL #1   Title Pt and wife feel that they are able to manage pt feet and ankle swelling at home    Baseline wife feels she she knows what to do, pt feels he is still working on it    Status On-going      PT [place] #2   Title Pt will increase # of reps of sit to stand to 10 reps in 30 seconds indicating an improvment in functional strength    Baseline 5 reps in 30 seconds on 12/31/2019, 10 reps in 30 seconds without use of hands    Status Achieved      PT SHORT TERM GOAL #3   Title Pt will decrease normal TUG score to < 14 seconds indicating an improvmemnt in functional mobility    Baseline 25.35 sec on 12/31/2019, 16.47 sec on 04/15/20,  05/21/2020 10.42 sec    Status Achieved             PT Long Term Goals - 05/21/20 1211      PT LONG TERM GOAL #1   Title Pt will report the pain in his legs is decreased to 4/10    Baseline 6/10 on eval, 5/10 at 04/15/2020, 05/21/2020 pain is 7/10    Time 8    Period Weeks    Status On-going      PT LONG TERM GOAL #2   Title Pt will report his tiredness with walking is decreased by 50%    Baseline 05/21/2020.  Pt is improved by about 30%    Time 8    Period Weeks    Status On-going      PT LONG TERM GOAL #3   Title Pt will increase # of reps of sit to stand to 10 reps in 30 seconds indicating an improvment in functional strength    Baseline 5 reps in 30 secons on 12/31/2019, 8 reps on 04/15/2020, 10 reps on 05/21/2020    Status Achieved      PT LONG TERM GOAL #4   Title Pt will decrease normal TUG score to < 12  seconds indicating an improvmemnt in functional mobility    Baseline 25.35 on 12/31/2019, 16.46. on 04/15/2020, 10 42 on [id number]/82021    Status Achieved      PT LONG TERM GOAL #5   Title Pt will increae BERG balancer score to > 46 for moderate risk for fall    Baseline 01/07/20 score is 36 and < 36 is high risk for falls if walking without at device ( close to 100%), 04/15/2020 score is 41    Status On-going      PT LONG TERM GOAL #6   Title Pt will be able to do a floor to stand transfer with min assist    Time 8  Period Weeks    Status On-going                 Plan - 05/27/20 1900    Clinical Impression Statement Pt very discouraged upon arrival, but felt better when he left.  He will try to think about a time during his day that he can regularly schedule his exericse session at home.  Gave a [place] program for UE strength and will add LE portion next session if he is able to follow through at home.  Began to work on functional LE strength activities for floor to stand transfers.    Comorbidities a fib, recent sepsis, kidney injury and pneumonia, ongoing chemo, past  back surgery    Examination-Activity Limitations Stand;Transfers;Stairs    Stability/Clinical Decision Making Evolving/Moderate complexity    Rehab Potential Good    PT Frequency 2x / week    PT Duration 8 weeks    PT Treatment/Interventions ADLs/Self Care Home Management;DME Instruction;Gait training;Stair training;Balance training;Therapeutic exercise;Therapeutic activities;Functional mobility training;Neuromuscular re-education;Cognitive remediation;Patient/family education;Manual lymph drainage;Orthotic Fit/Training;Manual techniques;Compression bandaging;Passive range of motion;Taping    PT Next Visit Plan See if patient was able to do his exercise at home and if he found a regular schedule for exericse, if so, issue medbridge LE program. redo [place] .  continue strengthening and work on floor to stand transfers  Monitor  feet. prn  Gait training  change direction, change speeds, direction, etc, stair training.  Progress to UE and LE strengthening, then standing work for balance and strength and advanced gait. consider inteval training for strengthening    Consulted and Agree with Plan of Care Patient           Patient will benefit from skilled therapeutic intervention in order to improve the following deficits and impairments:  Abnormal gait, Decreased balance, Decreased endurance, Decreased mobility, Difficulty walking, Impaired sensation, Impaired perceived functional ability, Increased edema, Decreased knowledge of precautions, Decreased activity tolerance, Decreased strength, Pain, Postural dysfunction  Visit Diagnosis: Muscle weakness (generalized)  Difficulty in walking, not elsewhere classified  Abnormal posture  Repeated falls  Lymphedema, not elsewhere classified     Problem List Patient Active Problem List   Diagnosis Date Noted  . Edema 05/20/2020  . Closed fracture of left proximal humerus 01/15/2020  . Acute cystitis without hematuria 11/07/2019  . Left inguinal  hernia 11/07/2019  . Physical deconditioning   . Depression with anxiety   . Acute respiratory failure with hypoxia ([place])   . Ischemic colitis ([place]) 09/18/2019  . Pressure injury of skin 09/16/2019  . Bloody diarrhea 09/14/2019  . Abdominal pain, diffuse 09/14/2019  . Acute metabolic encephalopathy 59/16/3846  . Goals of care, counseling/discussion 09/14/2019  . DNR (do not resuscitate) 09/14/2019  . Peripheral neuropathy 09/13/2019  . Gait disorder 09/13/2019  . Multiple myeloma not having achieved remission ([place]) 05/17/2019  . Counseling regarding advance care planning and goals of care 05/17/2019  . Bone metastases ([place]) 05/17/2019  . Postoperative fever 05/09/2019  . S/P TAVR (transcatheter aortic valve replacement) 05/08/2019  . Multiple myeloma ([place]) 05/08/2019  . Acute on chronic diastolic heart failure ([place]) 05/08/2019  . Severe aortic stenosis   . CAD (coronary artery disease) 08/01/2018  . Hematochezia 07/27/2018  . HTN (hypertension) 02/28/2018  . Hypercholesteremia 02/28/2018  . Degeneration of lumbar intervertebral disc 12/06/2017  . Adenomatous polyp of ascending colon 12/02/2017  . Chronic low back pain 09/26/2017  . Mastoiditis 03/21/2017  . Abnormal TSH 12/23/2016  . PNA (pneumonia)   .  AKI (acute kidney injury) ([place])   . Ascending aortic aneurysm ([place]) 02/15/2013  . Atrial fibrillation with RVR ([place]) 01/23/2013  . Well adult exam 05/31/2012  . PAF (paroxysmal atrial fibrillation) ([place]) 08/05/2008  . HLD (hyperlipidemia) 04/08/2007  . Essential hypertension 04/08/2007   Donato Heinz. Owens Shark PT  Norwood Levo 05/27/2020, 7:06 PM  [place], [place], [address] Phone: [phone]   Fax:  [phone]  Name: [name] [name] MRN: [id number] Date of Birth: 01-22-1941

## 2020-05-27 NOTE — Patient Instructions (Signed)
Access Code: [email or web address] URL: [email or web address] Date: 05/27/2020 Prepared by: Maudry Diego  Exercises Supine Shoulder Horizontal Abduction with Resistance - 1 x daily - 4-5 x weekly - 1-2 sets - 10 reps - 3 sec hold Supine Shoulder External Rotation with Resistance - 1 x daily - 4-5 x weekly - 1-2 sets - 10 reps - 3 sec hold Supine narrow and wide grip flexion - 1 x daily - 4-5 x weekly - 1-2 sets - 10 reps - 3 sec hold Supine PNF D2 - 1 x daily - 4-5 x weekly - 10 reps - 1-2 sets - 3 sec hold

## 2020-05-29 ENCOUNTER — Other Ambulatory Visit: Payer: Self-pay

## 2020-05-29 ENCOUNTER — Encounter: Payer: Self-pay | Admitting: Physical Therapy

## 2020-05-29 ENCOUNTER — Ambulatory Visit: Payer: Medicare Other | Admitting: Physical Therapy

## 2020-05-29 DIAGNOSIS — M6281 Muscle weakness (generalized): Secondary | ICD-10-CM

## 2020-05-29 DIAGNOSIS — R262 Difficulty in walking, not elsewhere classified: Secondary | ICD-10-CM

## 2020-05-29 DIAGNOSIS — R296 Repeated falls: Secondary | ICD-10-CM | POA: Diagnosis not present

## 2020-05-29 DIAGNOSIS — R293 Abnormal posture: Secondary | ICD-10-CM

## 2020-05-29 DIAGNOSIS — I890 Lymphedema, not elsewhere classified: Secondary | ICD-10-CM

## 2020-05-29 NOTE — Therapy (Signed)
[place], [place], [address] Phone: [phone]   Fax:  [phone]  Physical Therapy Treatment  Patient Details  Name: [name] [name] MRN: [id number] Date of Birth: [birth year] Referring Provider (PT): Dr. Irene Limbo    Encounter Date: 05/29/2020   PT End of Session - 05/29/20 1357    Visit Number 11    Number of Visits 20    Date for PT Re-Evaluation 06/03/20    PT Start Time 1300    PT Stop Time 1349    PT Time Calculation (min) 49 min    Activity Tolerance Patient tolerated treatment well    Behavior During Therapy [facility] for tasks assessed/performed           Past Medical History:  Diagnosis Date  . Ascending aortic aneurysm ([place])   . Bicuspid aortic valve   . Cancer ([place])   . CHF NYHA class I (no symptoms from ordinary activities), acute, diastolic ([place])   . Dysrhythmia 2009   A fib  . Fatty liver    mild  . Fracture    left proximal humerus  . GERD (gastroesophageal reflux disease)   . GI bleeding 07/21/2018   post polypectomy  . Hemorrhoids   . HTN (hypertension)   . Hypercholesteremia   . Hypokalemia   . Internal hemorrhoids   . LBP (low back pain)   . Moderate aortic stenosis   . Osteoarthritis   . Paroxysmal atrial fibrillation ([place])    a. new onset Afib in 07/2008. He underwent ibutilide cardioversion successfully. b. Recurrence 01/2013 s/p TEE/DCCV - was on Xarelto but he stopped it as he was convinced it was causing joint pn. c. Recurrence 01/2016 - spont conv to NSR. Pt took Eliquis x1 mo then declined further anticoag. d. Recurrence 07/2016.  [name] Pneumonia   . Tubular adenoma of colon     Past Surgical History:  Procedure Laterality Date  . BACK SURGERY  x12 years ago  . CARDIAC CATHETERIZATION  2020  . CARDIAC VALVE REPLACEMENT  2020  . CARDIOVERSION N/A 01/26/2013   Procedure: CARDIOVERSION;  Surgeon: Larey Dresser, MD;  Location: [facility] ENDOSCOPY;  Service: Cardiovascular;   Laterality: N/A;  . CARDIOVERSION N/A 10/28/2017   Procedure: CARDIOVERSION;  Surgeon: Larey Dresser, MD;  Location: [facility] ENDOSCOPY;  Service: Cardiovascular;  Laterality: N/A;  . CARDIOVERSION N/A 03/03/2018   Procedure: CARDIOVERSION;  Surgeon: Lelon Perla, MD;  Location: [facility] ENDOSCOPY;  Service: Cardiovascular;  Laterality: N/A;  . CARDIOVERSION N/A 09/19/2019   Procedure: CARDIOVERSION;  Surgeon: Larey Dresser, MD;  Location: [facility] ENDOSCOPY;  Service: Cardiovascular;  Laterality: N/A;  . COLONOSCOPY    . COLONOSCOPY  07/17/2018   at [facility]  . HEMORRHOID SURGERY    . INGUINAL HERNIA REPAIR Left 03/18/2020   Procedure: OPEN LEFT INGUINAL HERNIA REPAIR;  Surgeon: Alphonsa Overall, MD;  Location: [place];  Service: General;  Laterality: Left;  . LUMBAR LAMINECTOMY    . ORIF HUMERUS FRACTURE Left 01/15/2020   Procedure: OPEN REDUCTION INTERNAL FIXATION (ORIF) PROXIMAL HUMERUS FRACTURE;  Surgeon: Nicholes Stairs, MD;  Location: [place];  Service: Orthopedics;  Laterality: Left;  . POLYPECTOMY    . RIGHT HEART CATH N/A 08/20/2019   Procedure: RIGHT HEART CATH;  Surgeon: Larey Dresser, MD;  Location: [place] CV LAB;  Service: Cardiovascular;  Laterality: N/A;  . RIGHT/LEFT HEART CATH AND CORONARY ANGIOGRAPHY N/A 03/07/2019   Procedure: RIGHT/LEFT HEART CATH AND CORONARY ANGIOGRAPHY;  Surgeon: Burnell Blanks, MD;  Location: [place] CV LAB;  Service: Cardiovascular;  Laterality: N/A;  . [name]  04/2019  . TEE WITHOUT CARDIOVERSION N/A 01/26/2013   Procedure: TRANSESOPHAGEAL ECHOCARDIOGRAM (TEE);  Surgeon: Larey Dresser, MD;  Location: [place];  Service: Cardiovascular;  Laterality: N/A;  . TEE WITHOUT CARDIOVERSION N/A 10/28/2017   Procedure: TRANSESOPHAGEAL ECHOCARDIOGRAM (TEE);  Surgeon: Larey Dresser, MD;  Location: [facility] ENDOSCOPY;  Service: Cardiovascular;  Laterality: N/A;  . TEE WITHOUT CARDIOVERSION N/A 05/08/2019   Procedure: TRANSESOPHAGEAL ECHOCARDIOGRAM (TEE);   Surgeon: Burnell Blanks, MD;  Location: [place] CV LAB;  Service: Open Heart Surgery;  Laterality: N/A;  . TEE WITHOUT CARDIOVERSION N/A 09/19/2019   Procedure: TRANSESOPHAGEAL ECHOCARDIOGRAM (TEE);  Surgeon: Larey Dresser, MD;  Location: [facility] ENDOSCOPY;  Service: Cardiovascular;  Laterality: N/A;  . TRANSCATHETER AORTIC VALVE REPLACEMENT, TRANSFEMORAL N/A 05/08/2019   Procedure: TRANSCATHETER AORTIC VALVE REPLACEMENT, TRANSFEMORAL;  Surgeon: Burnell Blanks, MD;  Location: [place] CV LAB;  Service: Open Heart Surgery;  Laterality: N/A;    There were no vitals filed for this visit.   Subjective Assessment - 05/29/20 1303    Subjective Pt states he drove himself here today.  He is still having pain in his legs. He said her was tired and sore from exercise last session but he feels better today    Pertinent History During preliminary work up for valve replacement in July of 2020 it was discovered that pt had large lytic lesions in his pelvis due to  multiple myeloma.  He had a TAVR in August 2021  and started chemotherapy for multiple myeloma which was successful in reducing the lestions. However,  he developed peripheral neuropathy and chemotherapy is being adjusted. He has multiple hospitalizations in dec-jan 2021 for septic colitis with acute kidney injury and pneumonia with associated deconditioning and difficutly walking due to Chemotherapy induced peripheral neuropathy.  Pt  had home health PT and continues to walk with a cane. Past history also significant for afib and back surgery.  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    Patient Stated Goals To get relief with pain in feet and decrease swelling, get more stable  and walk without the came    Currently in Pain? Yes    Pain Score 5     Pain Location Leg    Pain Orientation Right;Left    Pain Descriptors / Indicators Aching              [facility] PT Assessment - 05/29/20 0001      Berg Balance Test   Sit  to Stand Able to stand  independently using hands    Standing Unsupported Able to stand safely 2 minutes    Sitting with Back Unsupported but Feet Supported on Floor or Stool Able to sit safely and securely 2 minutes    Stand to Sit Sits safely with minimal use of hands    Transfers Able to transfer safely, minor use of hands    Standing Unsupported with Eyes Closed Able to stand 10 seconds safely    Standing Unsupported with Feet Together Able to place feet together independently and stand 1 minute safely    From Standing, Reach Forward with Outstretched Arm Can reach forward >12 cm safely (5")    From Standing Position, Pick up Object from Floor Able to pick up shoe, needs supervision    From Standing Position, Turn to Look Behind Over each  Shoulder Looks behind one side only/other side shows less weight shift    Turn 360 Degrees Able to turn 360 degrees safely one side only in 4 seconds or less    Standing Unsupported, Alternately Place Feet on Step/Stool Able to stand independently and complete 8 steps >20 seconds    Standing Unsupported, One Foot in Front Needs help to step but can hold 15 seconds    Standing on One Leg Able to lift leg independently and hold equal to or more than 3 seconds    Total Score 45    Berg comment: < 36 is high risk for falls if walking without at device ( close to 100%)                          [facility] Adult PT Treatment/Exercise - 05/29/20 0001      Ambulation/Gait   Ambulation/Gait Yes    Gait Comments pt practiced walking at different speeds  with no cane or devices. emphasized arm swing and pt did not have loss of balance       High Level Balance   High Level Balance Activities Side stepping;Direction changes;Turns;Sudden stops      Lumbar Exercises: Quadruped   Other Quadruped Lumbar Exercises hands down on mat in quaduped and pt with hip extensions  also did alternate arm raises       Knee/Hip Exercises: Standing   Forward Lunges  Right;Left;10 reps      Knee/Hip Exercises: Seated   Abduction/Adduction  Strengthening;Right;Left;10 reps    Abd/Adduction Limitations red theraband loop around     Sit to [employer] 10 reps      Shoulder Exercises: Standing   Other Standing Exercises wall push ups                     PT Short Term Goals - 05/21/20 1203      PT SHORT TERM GOAL #1   Title Pt and wife feel that they are able to manage pt feet and ankle swelling at home    Baseline wife feels she she knows what to do, pt feels he is still working on it    Status On-going      PT [place] #2   Title Pt will increase # of reps of sit to stand to 10 reps in 30 seconds indicating an improvment in functional strength    Baseline 5 reps in 30 seconds on 12/31/2019, 10 reps in 30 seconds without use of hands    Status Achieved      PT SHORT TERM GOAL #3   Title Pt will decrease normal TUG score to < 14 seconds indicating an improvmemnt in functional mobility    Baseline 25.35 sec on 12/31/2019, 16.47 sec on 04/15/20, 05/21/2020 10.42 sec    Status Achieved             PT Long Term Goals - 05/21/20 1211      PT LONG TERM GOAL #1   Title Pt will report the pain in his legs is decreased to 4/10    Baseline 6/10 on eval, 5/10 at 04/15/2020, 05/21/2020 pain is 7/10    Time 8    Period Weeks    Status On-going      PT LONG TERM GOAL #2   Title Pt will report his tiredness with walking is decreased by 50%    Baseline 05/21/2020.  Pt is improved by about  30%    Time 8    Period Weeks    Status On-going      PT LONG TERM GOAL #3   Title Pt will increase # of reps of sit to stand to 10 reps in 30 seconds indicating an improvment in functional strength    Baseline 5 reps in 30 secons on 12/31/2019, 8 reps on 04/15/2020, 10 reps on 05/21/2020    Status Achieved      PT LONG TERM GOAL #4   Title Pt will decrease normal TUG score to < 12  seconds indicating an improvmemnt in functional mobility    Baseline 25.35 on  12/31/2019, 16.46. on 04/15/2020, 10 42 on 9/82021    Status Achieved      PT LONG TERM GOAL #5   Title Pt will increae BERG balancer score to > 46 for moderate risk for fall    Baseline 01/07/20 score is 36 and < 36 is high risk for falls if walking without at device ( close to 100%), 04/15/2020 score is 41    Status On-going      PT LONG TERM GOAL #6   Title Pt will be able to do a floor to stand transfer with min assist    Time 8    Period Weeks    Status On-going                 Plan - 05/29/20 1357    Clinical Impression Statement pt doing better today.  Repeated Berg balance and he had some improvment in dynamic activity but still has problems with narrow base of support and one legged balance.  Worked on this and UE strength today as well as increasingy gait velocity and arm swing for increased balance.  Pt did well today. Upgraded home program to include LE exercise and pt will continue with theraband exercises from last session    Personal Factors and Comorbidities Age;Comorbidity 3+;Fitness;Past/Current Experience    Comorbidities a fib, recent sepsis, kidney injury and pneumonia, ongoing chemo, past back surgery    Rehab Potential Good    PT Frequency 2x / week    PT Duration 8 weeks    PT Treatment/Interventions ADLs/Self Care Home Management;DME Instruction;Gait training;Stair training;Balance training;Therapeutic exercise;Therapeutic activities;Functional mobility training;Neuromuscular re-education;Cognitive remediation;Patient/family education;Manual lymph drainage;Orthotic Fit/Training;Manual techniques;Compression bandaging;Passive range of motion;Taping    PT Next Visit Plan check date for recert .  continue strengthening and work on floor to stand transfers  Monitor  feet. prn  Gait training  change direction, change speeds, direction, etc, stair training.  Progress to UE and LE strengthening, then standing work for balance and strength and advanced gait. consider inteval  training for strengthening    PT Home Exercise Plan supine scap exercise and standing squats, hip abduction adn wall push ups    Consulted and Agree with Plan of Care Patient           Patient will benefit from skilled therapeutic intervention in order to improve the following deficits and impairments:  Abnormal gait, Decreased balance, Decreased endurance, Decreased mobility, Difficulty walking, Impaired sensation, Impaired perceived functional ability, Increased edema, Decreased knowledge of precautions, Decreased activity tolerance, Decreased strength, Pain, Postural dysfunction  Visit Diagnosis: Muscle weakness (generalized)  Difficulty in walking, not elsewhere classified  Abnormal posture  Repeated falls  Lymphedema, not elsewhere classified     Problem List Patient Active Problem List   Diagnosis Date Noted  . Edema 05/20/2020  . Closed fracture of left proximal  humerus 01/15/2020  . Acute cystitis without hematuria 11/07/2019  . Left inguinal hernia 11/07/2019  . Physical deconditioning   . Depression with anxiety   . Acute respiratory failure with hypoxia ([place])   . Ischemic colitis ([place]) 09/18/2019  . Pressure injury of skin 09/16/2019  . Bloody diarrhea 09/14/2019  . Abdominal pain, diffuse 09/14/2019  . Acute metabolic encephalopathy 31/43/8887  . Goals of care, counseling/discussion 09/14/2019  . DNR (do not resuscitate) 09/14/2019  . Peripheral neuropathy 09/13/2019  . Gait disorder 09/13/2019  . Multiple myeloma not having achieved remission ([place]) 05/17/2019  . Counseling regarding advance care planning and goals of care 05/17/2019  . Bone metastases ([place]) 05/17/2019  . Postoperative fever 05/09/2019  . S/P TAVR (transcatheter aortic valve replacement) 05/08/2019  . Multiple myeloma ([place]) 05/08/2019  . Acute on chronic diastolic heart failure ([place]) 05/08/2019  . Severe aortic stenosis   . CAD (coronary artery disease) 08/01/2018  . Hematochezia  07/27/2018  . HTN (hypertension) 02/28/2018  . Hypercholesteremia 02/28/2018  . Degeneration of lumbar intervertebral disc 12/06/2017  . Adenomatous polyp of ascending colon 12/02/2017  . Chronic low back pain 09/26/2017  . Mastoiditis 03/21/2017  . Abnormal TSH 12/23/2016  . PNA (pneumonia)   . AKI (acute kidney injury) ([place])   . Ascending aortic aneurysm ([place]) 02/15/2013  . Atrial fibrillation with RVR ([place]) 01/23/2013  . Well adult exam 05/31/2012  . PAF (paroxysmal atrial fibrillation) ([place]) 08/05/2008  . HLD (hyperlipidemia) 04/08/2007  . Essential hypertension 04/08/2007   Donato Heinz. Owens Shark PT  Norwood Levo 05/29/2020, 2:02 PM  [place], [place], [address] Phone: [phone]   Fax:  [phone]  Name: [name] [name] MRN: [id number] Date of Birth: 12/03/1940

## 2020-05-29 NOTE — Patient Instructions (Signed)
Access Code: [email or web address] URL: [email or web address] Date: 05/29/2020 Prepared by: Maudry Diego  Exercises Squat with Chair Touch - 1 x daily - 7 x weekly - 1 sets - 10 reps Standing Hip Abduction with Counter Support - 1 x daily - 7 x weekly - 1 sets - 10 reps Wall Push Up - 1 x daily - 7 x weekly - 3 sets - 10 reps

## 2020-06-02 NOTE — Progress Notes (Signed)
HEMATOLOGY/ONCOLOGY CLINIC NOTE  Date of Service: 06/03/2020  Patient Care Team: Cassandria Anger, MD as PCP - General Larey Dresser, MD as PCP - Advanced Heart Failure (Cardiology) Erline Levine, MD as Attending Physician (Neurosurgery) Jarome Matin, MD as Consulting Physician (Dermatology) Rigoberto Noel, MD as Consulting Physician (Pulmonary Disease) Alda Berthold, DO as Consulting Physician (Neurology)   CHIEF COMPLAINTS/PURPOSE OF CONSULTATION:   Plasma cell myeloma   HISTORY OF PRESENTING ILLNESS:  [name] [name] is a wonderful 79 y.o. male who has been referred to [place] by Angelena Form, PA for evaluation and management of lytic bone lesions. The pt reports that he is doing well overall.  The pt reports that he has occasional hip pain that radiates down his legs and prevents him from walking. He uses Advil, which helps his hip pain. He is used to exercising often, but has not been able to stay very physically active lately so he is gaining weight. The pt experiences some SOB when he wakes up in the morning.  The pt had a pre-procedural CTA C/A/P before a TAVR completed on 03/14/2019 which revealed "indeterminate osseus lesions in the bony pelvis," which led to an MRI and PET scan. He reports that he has pain when he pushes on his right chest.   He also notes that he had a fall while exercising in 06/2018 and thought he broke his back. He received a blood transfusion on 07/24/2018.   Of note prior to the patient's visit today, the pt has had a MRI pelvis w/wo contrast completed on 03/28/2019 with results revealing "1. Destructive bone lesions as detailed above. Findings most consistent with metastatic disease. PET-CT may be helpful for further evaluation and to establish a primary tumor. The right pelvic bone lesions should be amenable to image guided biopsy but a PET scan may demonstrate easier/safer biopsy sites. 2. No intrapelvic mass or adenopathy. 3. Benign  intraosseous lipoma involving the left anterior superior acetabulum."  The pt has also had PET whole body completed on 04/06/2019 with results revealing "1. Diffuse osseous metastatic disease as detailed above without findings for a primary neoplasm in the chest, abdomen or pelvis. The large destructive lesion involving the right ischium should be amenable to image guided biopsy. 2. Two small retroperitoneal lymph nodes and 1 small right obturator node showing hypermetabolism."   Most recent lab results (04/06/2019) of CBC is as follows: all values are WNL.  On review of systems, pt reports hip and leg pain, weight gain and denies syncope and denies belly pain, recent neuropathy and any other symptoms.   On PMHx the pt reports 5 cm hepatic flexure polyp removal, pneumonia, blood transfusion on 07/24/2018  On Social Hx the pt reports that he lives at home with his wife. He is from [place].    INTERVAL HISTORY: [name] [name] is a wonderful 79 y.o. male who has who is here today for evaluation and management of his Plasma Cell Myeloma. Pt is accompanied by his wife. The patient's last visit with [place] was on 04/08/2020. The pt reports that he is doing well overall.  The pt reports that his leg pain is worsening. The pain is mostly concentrated in his feet, but extends to his mid calf. When he moves he begins to experience discomfort in his knees. Pt has been using a cane regularly and denies any falls in the interim. He has continued PT, which his wife believes his helping with his mobility. Pt reports increased numbness  in his fingertips. He does not associate this with the recent dropping temperatures. He has continued taking Gabapentin and Cymbalta as prescribed. Pt is taking one Oxycodone at night. Pt is receiving massages to his lower legs and using Voltaren cream once per day.  He has discontinued Lasix at the advisement of Dr. Aundra Dubin. He was told to take as needed. Pt was taken off of Amiodarone  in August due to elevating TSH. He continues Eliquis BID for Paroxysmal atrial fibrillation.   Lab results today (06/03/20) of CBC w/diff and CMP is as follows: all values are WNL except for RBC at 4.16, Hgb at 12.8, PLT at 136K, Abs Immature Granulocytes at 0.23K, Glucose at 116, Total Protein at 5.9, GFR Est Non Af Am at 59. 06/03/2020 K/L light chains are in progress 06/03/2020 MMP is in progress  On review of systems, pt reports lower extremity pain, fingertip numbness, improving leg swelling and denies falls, dizziness, palpitations, new back pain, hip pain and any other symptoms.   MEDICAL HISTORY:  Past Medical History:  Diagnosis Date  . Ascending aortic aneurysm ([place])   . Bicuspid aortic valve   . Cancer ([place])   . CHF NYHA class I (no symptoms from ordinary activities), acute, diastolic ([place])   . Dysrhythmia 2009   A fib  . Fatty liver    mild  . Fracture    left proximal humerus  . GERD (gastroesophageal reflux disease)   . GI bleeding 07/21/2018   post polypectomy  . Hemorrhoids   . HTN (hypertension)   . Hypercholesteremia   . Hypokalemia   . Internal hemorrhoids   . LBP (low back pain)   . Moderate aortic stenosis   . Osteoarthritis   . Paroxysmal atrial fibrillation ([place])    a. new onset Afib in 07/2008. He underwent ibutilide cardioversion successfully. b. Recurrence 01/2013 s/p TEE/DCCV - was on Xarelto but he stopped it as he was convinced it was causing joint pn. c. Recurrence 01/2016 - spont conv to NSR. Pt took Eliquis x1 mo then declined further anticoag. d. Recurrence 07/2016.  [name] Pneumonia   . Tubular adenoma of colon     SURGICAL HISTORY: Past Surgical History:  Procedure Laterality Date  . BACK SURGERY  x12 years ago  . CARDIAC CATHETERIZATION  2020  . CARDIAC VALVE REPLACEMENT  2020  . CARDIOVERSION N/A 01/26/2013   Procedure: CARDIOVERSION;  Surgeon: Larey Dresser, MD;  Location: [facility] ENDOSCOPY;  Service: Cardiovascular;  Laterality: N/A;  .  CARDIOVERSION N/A 10/28/2017   Procedure: CARDIOVERSION;  Surgeon: Larey Dresser, MD;  Location: [facility] ENDOSCOPY;  Service: Cardiovascular;  Laterality: N/A;  . CARDIOVERSION N/A 03/03/2018   Procedure: CARDIOVERSION;  Surgeon: Lelon Perla, MD;  Location: [facility] ENDOSCOPY;  Service: Cardiovascular;  Laterality: N/A;  . CARDIOVERSION N/A 09/19/2019   Procedure: CARDIOVERSION;  Surgeon: Larey Dresser, MD;  Location: [facility] ENDOSCOPY;  Service: Cardiovascular;  Laterality: N/A;  . COLONOSCOPY    . COLONOSCOPY  07/17/2018   at [facility]  . HEMORRHOID SURGERY    . INGUINAL HERNIA REPAIR Left 03/18/2020   Procedure: OPEN LEFT INGUINAL HERNIA REPAIR;  Surgeon: Alphonsa Overall, MD;  Location: [place];  Service: General;  Laterality: Left;  . LUMBAR LAMINECTOMY    . ORIF HUMERUS FRACTURE Left 01/15/2020   Procedure: OPEN REDUCTION INTERNAL FIXATION (ORIF) PROXIMAL HUMERUS FRACTURE;  Surgeon: Nicholes Stairs, MD;  Location: [place];  Service: Orthopedics;  Laterality: Left;  . POLYPECTOMY    .  RIGHT HEART CATH N/A 08/20/2019   Procedure: RIGHT HEART CATH;  Surgeon: Larey Dresser, MD;  Location: [place] CV LAB;  Service: Cardiovascular;  Laterality: N/A;  . RIGHT/LEFT HEART CATH AND CORONARY ANGIOGRAPHY N/A 03/07/2019   Procedure: RIGHT/LEFT HEART CATH AND CORONARY ANGIOGRAPHY;  Surgeon: Burnell Blanks, MD;  Location: [place] CV LAB;  Service: Cardiovascular;  Laterality: N/A;  . [name]  04/2019  . TEE WITHOUT CARDIOVERSION N/A 01/26/2013   Procedure: TRANSESOPHAGEAL ECHOCARDIOGRAM (TEE);  Surgeon: Larey Dresser, MD;  Location: [place];  Service: Cardiovascular;  Laterality: N/A;  . TEE WITHOUT CARDIOVERSION N/A 10/28/2017   Procedure: TRANSESOPHAGEAL ECHOCARDIOGRAM (TEE);  Surgeon: Larey Dresser, MD;  Location: [facility] ENDOSCOPY;  Service: Cardiovascular;  Laterality: N/A;  . TEE WITHOUT CARDIOVERSION N/A 05/08/2019   Procedure: TRANSESOPHAGEAL ECHOCARDIOGRAM (TEE);  Surgeon: Burnell Blanks, MD;  Location: [place] CV LAB;  Service: Open Heart Surgery;  Laterality: N/A;  . TEE WITHOUT CARDIOVERSION N/A 09/19/2019   Procedure: TRANSESOPHAGEAL ECHOCARDIOGRAM (TEE);  Surgeon: Larey Dresser, MD;  Location: [facility] ENDOSCOPY;  Service: Cardiovascular;  Laterality: N/A;  . TRANSCATHETER AORTIC VALVE REPLACEMENT, TRANSFEMORAL N/A 05/08/2019   Procedure: TRANSCATHETER AORTIC VALVE REPLACEMENT, TRANSFEMORAL;  Surgeon: Burnell Blanks, MD;  Location: [place] CV LAB;  Service: Open Heart Surgery;  Laterality: N/A;    SOCIAL HISTORY: Social History   Socioeconomic History  . Marital status: Married    Spouse name: Not on file  . Number of children: 0  . Years of education: Not on file  . Highest education level: Not on file  Occupational History  . Occupation: Retired Lobbyist: [employer]  Tobacco Use  . Smoking status: Never Smoker  . Smokeless tobacco: Never Used  Vaping Use  . Vaping Use: Never used  Substance and Sexual Activity  . Alcohol use: Not Currently  . Drug use: No  . Sexual activity: Yes  Other Topics Concern  . Not on file  Social History Narrative   Patient lives in [place] w/ his wife. He is a native of [place]. 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   Right-handed   Caffeine: 2 cups coffee per day   Two story home   Social Determinants of Health   Financial Resource Strain:   . Difficulty of Paying Living Expenses: Not on file  Food Insecurity:   . Worried About Charity fundraiser in the Last Year: Not on file  . Ran Out of Food in the Last Year: Not on file  Transportation Needs:   . Lack of Transportation (Medical): Not on file  . Lack of Transportation (Non-Medical): Not on file  Physical Activity:   . Days of Exercise per Week: Not on file  . Minutes of Exercise per Session: Not on file  Stress:   . Feeling of Stress : Not on file  Social Connections:   .  Frequency of Communication with Friends and Family: Not on file  . Frequency of Social Gatherings with Friends and Family: Not on file  . Attends Religious Services: Not on file  . Active Member of Clubs or Organizations: Not on file  . Attends Archivist Meetings: Not on file  . Marital Status: Not on file  Intimate Partner Violence:   . Fear of Current or Ex-Partner: Not on file  . Emotionally Abused: Not on file  . Physically Abused: Not on  file  . Sexually Abused: Not on file    FAMILY HISTORY: Family History  Problem Relation Age of Onset  . Colon cancer Mother 34  . Hypertension Other   . Coronary artery disease Neg Hx   . Colon polyps Neg Hx   . Esophageal cancer Neg Hx   . Rectal cancer Neg Hx   . Stomach cancer Neg Hx     ALLERGIES:  is allergic to xarelto [rivaroxaban], ambien [zolpidem], corticosteroids, ramipril, and benazepril.  MEDICATIONS:  Current Outpatient Medications  Medication Sig Dispense Refill  . acyclovir (ZOVIRAX) 400 MG tablet Take 1 tablet (400 mg total) by mouth 2 (two) times daily. 180 tablet 3  . amLODipine (NORVASC) 2.5 MG tablet Take 1 tablet (2.5 mg total) by mouth daily. 90 tablet 3  . apixaban (ELIQUIS) 5 MG TABS tablet Take 5 mg by mouth 2 (two) times daily.    [name] b complex vitamins tablet Take 1 tablet by mouth daily. 100 tablet 3  . Calcium-Magnesium 500-250 MG TABS Take 1 tablet by mouth daily.    . Carboxymethylcellul-Glycerin (LUBRICATING EYE DROPS OP) Place 1 drop into both eyes daily as needed (dry eyes).    . Cholecalciferol (VITAMIN D) 50 MCG (2000 UT) tablet Take 2,000 Units by mouth daily.    [name] dexamethasone (DECADRON) 4 MG tablet Take 12 mg by mouth once a week. On tuesdays    . diclofenac Sodium (VOLTAREN) 1 % GEL Apply 1 application topically 2 (two) times daily as needed (pain.).    [name] DULoxetine (CYMBALTA) 60 MG capsule TAKE 1 CAPSULE BY MOUTH EVERY DAY 90 capsule 1  . famotidine (PEPCID) 40 MG tablet Take 40 mg by  mouth daily.    . furosemide (LASIX) 20 MG tablet Take 20 mg by mouth every other day. (Patient not taking: Reported on 05/20/2020)    . gabapentin (NEURONTIN) 300 MG capsule Take 1 capsule (300 mg total) by mouth 3 (three) times daily.    [name] LORazepam (ATIVAN) 0.5 MG tablet TAKE 1 TABLET (0.5 MG TOTAL) BY MOUTH EVERY 8 (EIGHT) HOURS AS NEEDED (NAUSEA OR VOMITING). 60 tablet 1  . metoprolol succinate (TOPROL-XL) 25 MG 24 hr tablet Take 25 mg by mouth 2 (two) times daily.    [name] oxyCODONE (OXY IR/ROXICODONE) 5 MG immediate release tablet Take 1-2 tablets (5-10 mg total) by mouth 2 (two) times daily as needed for severe pain. 60 tablet 0  . polyethylene glycol powder (GLYCOLAX/MIRALAX) 17 GM/SCOOP powder Take 17-34 g by mouth 2 (two) times daily as needed for moderate constipation. 500 g 5  . senna-docusate (SENOKOT-S) 8.6-50 MG tablet Take 2 tablets by mouth at bedtime.    [name] spironolactone (ALDACTONE) 25 MG tablet Take 1 tablet (25 mg total) by mouth daily. 30 tablet 3  . Vitamin D, Ergocalciferol, (DRISDOL) 1.25 MG (50000 UNIT) CAPS capsule TAKE 1 CAPSULE BY MOUTH ONE TIME PER WEEK 12 capsule 3   No current facility-administered medications for this visit.    REVIEW OF SYSTEMS:   A 10+ POINT REVIEW OF SYSTEMS WAS OBTAINED including neurology, dermatology, psychiatry, cardiac, respiratory, lymph, extremities, GI, GU, Musculoskeletal, constitutional, breasts, reproductive, HEENT.  All pertinent positives are noted in the HPI.  All others are negative.   PHYSICAL EXAMINATION: Vitals:   06/03/20 0917  BP: 110/73  Pulse: (!) 57  Resp: 18  Temp: 97.7 F (36.5 C)  SpO2: 100%   Wt Readings from Last 3 Encounters:  06/03/20 175 lb 1.6 oz (79.4 kg)  05/20/20 175 lb (79.4 kg)  05/07/20 168 lb 9.6 oz (76.5 kg)   Body mass index is 24.42 kg/m.    ECOG FS:2 - Symptomatic, <50% confined to bed  Exam was given in a chair   GENERAL:alert, in no acute distress and comfortable SKIN: no acute rashes,  no significant lesions EYES: conjunctiva are pink and non-injected, sclera anicteric OROPHARYNX: MMM, no exudates, no oropharyngeal erythema or ulceration NECK: supple, no JVD LYMPH:  no palpable lymphadenopathy in the cervical, axillary or inguinal regions LUNGS: clear to auscultation b/l with normal respiratory effort HEART: regular rate & rhythm ABDOMEN:  normoactive bowel sounds , non tender, not distended. No palpable hepatosplenomegaly.  Extremity: 1+ pedal edema b/l PSYCH: alert & oriented x 3 with fluent speech NEURO: no focal motor/sensory deficits  LABORATORY DATA:  I have reviewed the data as listed  . CBC Latest Ref Rng & Units 06/03/2020 04/08/2020 03/12/2020  WBC 4.0 - 10.5 K/uL 8.9 9.4 19.9(H)  Hemoglobin 13.0 - 17.0 g/dL 12.8(L) 13.3 12.9(L)  Hematocrit 39 - 52 % 39.4 41.7 40.4  Platelets 150 - 400 K/uL 136(L) 130(L) 178    . CMP Latest Ref Rng & Units 06/03/2020 04/25/2020 04/08/2020  Glucose 70 - 99 mg/dL 116(H) 110(H) 96  BUN 8 - 23 mg/dL 23 27(H) 23  Creatinine 0.61 - 1.24 mg/dL 1.17 1.25(H) 1.31(H)  Sodium 135 - 145 mmol/L 141 141 141  Potassium 3.5 - 5.1 mmol/L 4.3 4.1 4.6  Chloride 98 - 111 mmol/L 108 109 107  CO2 22 - 32 mmol/L $RemoveB'23 24 24  'uTEQbtQV$ Calcium 8.9 - 10.3 mg/dL 9.0 9.3 9.5  Total Protein 6.5 - 8.1 g/dL 5.9(L) 5.7(L) 6.1(L)  Total Bilirubin 0.3 - 1.2 mg/dL 0.5 0.8 0.5  Alkaline Phos 38 - 126 U/L 60 55 71  AST 15 - 41 U/L 20 41 32  ALT 0 - 44 U/L 32 58(H) 76(H)   Component     Latest Ref Rng & Units 04/30/2019  Total Protein, Urine-UPE24     Not Estab. mg/dL   Total Protein, Urine-Ur/day     30 - 150 mg/24 hr   ALBUMIN, U     %   ALPHA 1 URINE     %   Alpha 2, Urine     %   % BETA, Urine     %   GAMMA GLOBULIN URINE     %   Free Kappa Lt Chains,Ur     0.63 - 113.79 mg/L   Free Lambda Lt Chains,Ur     0.47 - 11.77 mg/L   Free Kappa/Lambda Ratio     1.03 - 31.76   Immunofixation Result, Urine        Total Volume        M-SPIKE %, Urine      Not Observed %   M-Spike, mg/24 hr     Not Observed mg/24 hr   NOTE:        IgG (Immunoglobin G), Serum     603 - 1,613 mg/dL 1,740 (H)  IgA     61 - 437 mg/dL 16 (L)  IgM (Immunoglobulin M), Srm     15 - 143 mg/dL 9 (L)  Total Protein ELP     6.0 - 8.5 g/dL 7.3  Albumin SerPl Elph-Mcnc     2.9 - 4.4 g/dL 3.8  Alpha 1     0.0 - 0.4 g/dL 0.3  Alpha2 Glob SerPl Elph-Mcnc     0.4 - 1.0  g/dL 0.7  B-Globulin SerPl Elph-Mcnc     0.7 - 1.3 g/dL 0.9  Gamma Glob SerPl Elph-Mcnc     0.4 - 1.8 g/dL 1.6  M Protein SerPl Elph-Mcnc     Not Observed g/dL 1.5 (H)  Globulin, Total     2.2 - 3.9 g/dL 3.5  Albumin/Glob SerPl     0.7 - 1.7 1.1  IFE 1      Comment  Please Note (HCV):      Comment  Kappa free light chain     3.3 - 19.4 mg/L 821.2 (H)  Lamda free light chains     5.7 - 26.3 mg/L 1.9 (L)  Kappa, lamda light chain ratio     0.26 - 1.65 432.21 (H)  LDH     98 - 192 U/L 132  Sed Rate     0 - 16 mm/hr 3      05/24/2019 Bone Marrow Biopsy    04/16/2019 Surgical Pathology:   RADIOGRAPHIC STUDIES: I have personally reviewed the radiological images as listed and agreed with the findings in the report. ECHOCARDIOGRAM COMPLETE  Result Date: 05/07/2020    ECHOCARDIOGRAM REPORT   Patient Name:   [facility] Date of Exam: 05/07/2020 Medical Rec #:  [id number]     Height:       70.0 in Accession #:    [id number]    Weight:       172.0 lb Date of Birth:  06-20-1941     BSA:          1.958 m Patient Age:    20 years      BP:           120/69 mmHg Patient Gender: M             HR:           58 bpm. Exam Location:  [place] Procedure: 2D Echo, Cardiac Doppler and Color Doppler Indications:    Z95.2 Post TAVR evaluation  History:        Patient has prior history of Echocardiogram examinations, most                 recent 09/19/2019. CHF, CAD, Arrythmias:Atrial Fibrillation; Risk                 Factors:Hypertension and Dyslipidemia. Ascending aortic                 aneurysm.  Severe aortic stenosis. Chronic low back pain. Acute                 kidney injury. Multiple myeloma. Bone metastases.                 Aortic Valve: 26 mm Edwards Sapien prosthetic, stented (TAVR)                 valve is present in the aortic position.  Sonographer:    Diamond Nickel RCS Referring Phys: [id number] [place]  1. Left ventricular ejection fraction, by estimation, is 60 to 65%. The left ventricle has normal function. The left ventricle has no regional wall motion abnormalities. Left ventricular diastolic parameters are consistent with Grade I diastolic dysfunction (impaired relaxation).  2. Right ventricular systolic function is normal. The right ventricular size is normal. There is normal pulmonary artery systolic pressure.  3. The mitral valve is normal in structure. No evidence of mitral valve regurgitation. No evidence of mitral stenosis.  4. 26 mm  Edwards Sapien 3 UItra THV x2 (second valve placed due to perivalvular leak) via the TF approach on 05/08/19. . The aortic valve has been repaired/replaced. Aortic valve regurgitation is not visualized. No aortic stenosis is present. There is a 26 mm Edwards Sapien prosthetic (TAVR) valve present in the aortic position. Echo findings are consistent with normal structure and function of the aortic valve prosthesis. Aortic valve area, by VTI measures 1.70 cm. Aortic valve mean gradient measures 15.0 mmHg. Aortic valve Vmax measures 2.62 m/s.  5. Aortic dilatation noted. There is mild to moderate dilatation of the ascending aorta measuring 45 mm.  6. The inferior vena cava is normal in size with greater than 50% respiratory variability, suggesting right atrial pressure of 3 mmHg. FINDINGS  Left Ventricle: Left ventricular ejection fraction, by estimation, is 60 to 65%. The left ventricle has normal function. The left ventricle has no regional wall motion abnormalities. The left ventricular internal cavity size was normal in size. There is   no left ventricular hypertrophy. Left ventricular diastolic parameters are consistent with Grade I diastolic dysfunction (impaired relaxation). Right Ventricle: The right ventricular size is normal. No increase in right ventricular wall thickness. Right ventricular systolic function is normal. There is normal pulmonary artery systolic pressure. The tricuspid regurgitant velocity is 2.28 m/s, and  with an assumed right atrial pressure of 10 mmHg, the estimated right ventricular systolic pressure is 30.8 mmHg. Left Atrium: Left atrial size was normal in size. Right Atrium: Right atrial size was normal in size. Pericardium: There is no evidence of pericardial effusion. Mitral Valve: The mitral valve is normal in structure. Normal mobility of the mitral valve leaflets. Mild to moderate mitral annular calcification. No evidence of mitral valve regurgitation. No evidence of mitral valve stenosis. Tricuspid Valve: The tricuspid valve is normal in structure. Tricuspid valve regurgitation is trivial. No evidence of tricuspid stenosis. Aortic Valve: 26 mm Edwards Sapien 3 UItra THV x2 (second valve placed due to perivalvular leak) via the TF approach on 05/08/19. The aortic valve has been repaired/replaced. Aortic valve regurgitation is not visualized. No aortic stenosis is present. Aortic valve mean gradient measures 15.0 mmHg. Aortic valve peak gradient measures 27.6 mmHg. Aortic valve area, by VTI measures 1.70 cm. There is a 26 mm Edwards Sapien prosthetic, stented (TAVR) valve present in the aortic position. Echo findings are consistent with normal structure and function of the aortic valve prosthesis. Pulmonic Valve: The pulmonic valve was normal in structure. Pulmonic valve regurgitation is not visualized. No evidence of pulmonic stenosis. Aorta: Aortic dilatation noted. There is mild to moderate dilatation of the ascending aorta measuring 45 mm. Venous: The inferior vena cava is normal in size with greater than 50%  respiratory variability, suggesting right atrial pressure of 3 mmHg. IAS/Shunts: No atrial level shunt detected by color flow Doppler.  LEFT VENTRICLE PLAX 2D LVIDd:         4.40 cm  Diastology LVIDs:         2.30 cm  LV e' lateral:   6.64 cm/s LV PW:         1.10 cm  LV E/e' lateral: 10.9 LV IVS:        1.10 cm  LV e' medial:    5.22 cm/s LVOT diam:     2.00 cm  LV E/e' medial:  13.9 LV SV:         96 LV SV Index:   49 LVOT Area:     3.14 cm  RIGHT VENTRICLE  RV Basal diam:  1.70 cm RV S prime:     11.20 cm/s TAPSE (M-mode): 2.5 cm RVSP:           23.8 mmHg LEFT ATRIUM             Index       RIGHT ATRIUM           Index LA diam:        3.50 cm 1.79 cm/m  RA Pressure: 3.00 mmHg LA Vol (A2C):   47.8 ml 24.42 ml/m RA Area:     17.50 cm LA Vol (A4C):   29.1 ml 14.86 ml/m RA Volume:   37.20 ml  19.00 ml/m LA Biplane Vol: 40.0 ml 20.43 ml/m  AORTIC VALVE AV Area (Vmax):    1.53 cm AV Area (Vmean):   1.44 cm AV Area (VTI):     1.70 cm AV Vmax:           262.50 cm/s AV Vmean:          179.500 cm/s AV VTI:            0.567 m AV Peak Grad:      27.6 mmHg AV Mean Grad:      15.0 mmHg LVOT Vmax:         128.00 cm/s LVOT Vmean:        82.300 cm/s LVOT VTI:          0.306 m LVOT/AV VTI ratio: 0.54 MITRAL VALVE               TRICUSPID VALVE MV Area (PHT): 2.11 cm    TR Peak grad:   20.8 mmHg MV Decel Time: 359 msec    TR Vmax:        228.00 cm/s MV E velocity: 72.40 cm/s  Estimated RAP:  3.00 mmHg MV A velocity: 89.50 cm/s  RVSP:           23.8 mmHg MV E/A ratio:  0.81                            SHUNTS                            Systemic VTI:  0.31 m                            Systemic Diam: 2.00 cm Donato Schultz MD Electronically signed by Donato Schultz MD Signature Date/Time: 05/07/2020/3:40:37 PM    Final     ASSESSMENT & PLAN:   79 yo   #1 Recently diagnosed Plasma cell myeloma  03/28/2019 MRI pelvis w/wo contrast revealed "1. Destructive bone lesions as detailed above. Findings most consistent with metastatic  disease. PET-CT may be helpful for further evaluation and to establish a primary tumor. The right pelvic bone lesions should be amenable to image guided biopsy but a PET scan may demonstrate easier/safer biopsy sites. 2. No intrapelvic mass or adenopathy. 3. Benign intraosseous lipoma involving the left anterior superior acetabulum."  04/06/2019 PET whole body revealed "1. Diffuse osseous metastatic disease as detailed above without findings for a primary neoplasm in the chest, abdomen or pelvis. The large destructive lesion involving the right ischium should be amenable to image guided biopsy. 2. 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%) PERIPHERAL BLOOD: - MORPHOLOGICALLY UNREMARKABLE"  05/24/2019 FISH Panel revealed "ABNORMAL result with 11q+, 14q+ and +17"  #2 Severe aortic stenosis with bicuspid aortic valve -10/26/2018 ECHO revealed AVA at 0.8 cm2 and LV EF of 60-65% -05/08/2019 pt had a Transfemoral Transcatheter Aortic Valve Replacement   PLAN: -Discussed pt labwork today, 06/03/20; Hgb is steady, mild thrombocytopenia, blood chemistries are steady, no hypercalcemia -Discussed 04/08/2020 MMP shows no M Protein observed, K/L light chains are WNL - 06/03/2020 K/L light chains are in progress -Pt continues to be in remission from Multiple Myeloma. No indication to restart treatment. Will continue watchful observation.  -Advised pt that the sensation of leg swelling can be interpreted as neuropathy.  -Advised pt that he likely had radiculopathy prior to treatment that was not limiting.  -Continue Gabapentin and Cymbalta as prescribed. -Recommend pt use Voltaren cream twice per day.  -Recommend pt discontinue Acyclovir -Continue Zometa  q28months -Will see back in 3 months with labs   FOLLOW UP: Cancel zometa on 11/16 Changing Zometa to q12weeks Plz schedule labs, MD visit and next dose of Zometa in q12weeks    The total time spent in the appt was 25 minutes and more than 50% was on counseling and direct patient cares.  All of the patient's questions were answered with apparent satisfaction. The patient knows to call the clinic with any problems, questions or concerns.   Sullivan Lone MD [place] AAHIVMS [facility] Hematology/Oncology Physician [facility]  (Office):       [phone] (Work cell):  [phone] (Fax):           [phone]  06/03/2020 10:17 AM  I, Yevette Edwards, am acting as a scribe for Dr. Sullivan Lone.   .I have reviewed the above documentation for accuracy and completeness, and I agree with the above. Brunetta Genera MD

## 2020-06-03 ENCOUNTER — Inpatient Hospital Stay: Payer: Medicare Other

## 2020-06-03 ENCOUNTER — Other Ambulatory Visit: Payer: Self-pay

## 2020-06-03 ENCOUNTER — Inpatient Hospital Stay (HOSPITAL_BASED_OUTPATIENT_CLINIC_OR_DEPARTMENT_OTHER): Payer: Medicare Other | Admitting: Hematology

## 2020-06-03 ENCOUNTER — Inpatient Hospital Stay: Payer: Medicare Other | Attending: Hematology

## 2020-06-03 VITALS — BP 110/73 | HR 57 | Temp 97.7°F | Resp 18 | Ht 71.0 in | Wt 175.1 lb

## 2020-06-03 DIAGNOSIS — C9000 Multiple myeloma not having achieved remission: Secondary | ICD-10-CM

## 2020-06-03 DIAGNOSIS — R600 Localized edema: Secondary | ICD-10-CM | POA: Insufficient documentation

## 2020-06-03 DIAGNOSIS — I10 Essential (primary) hypertension: Secondary | ICD-10-CM | POA: Insufficient documentation

## 2020-06-03 DIAGNOSIS — Z7901 Long term (current) use of anticoagulants: Secondary | ICD-10-CM | POA: Diagnosis not present

## 2020-06-03 DIAGNOSIS — I2510 Atherosclerotic heart disease of native coronary artery without angina pectoris: Secondary | ICD-10-CM | POA: Diagnosis not present

## 2020-06-03 DIAGNOSIS — M1990 Unspecified osteoarthritis, unspecified site: Secondary | ICD-10-CM | POA: Diagnosis not present

## 2020-06-03 DIAGNOSIS — Z79899 Other long term (current) drug therapy: Secondary | ICD-10-CM | POA: Diagnosis not present

## 2020-06-03 DIAGNOSIS — M25559 Pain in unspecified hip: Secondary | ICD-10-CM | POA: Diagnosis not present

## 2020-06-03 DIAGNOSIS — C7951 Secondary malignant neoplasm of bone: Secondary | ICD-10-CM | POA: Diagnosis not present

## 2020-06-03 DIAGNOSIS — Z7189 Other specified counseling: Secondary | ICD-10-CM

## 2020-06-03 DIAGNOSIS — D696 Thrombocytopenia, unspecified: Secondary | ICD-10-CM | POA: Insufficient documentation

## 2020-06-03 LAB — CMP (CANCER CENTER ONLY)
ALT: 32 U/L (ref 0–44)
AST: 20 U/L (ref 15–41)
Albumin: 3.8 g/dL (ref 3.5–5.0)
Alkaline Phosphatase: 60 U/L (ref 38–126)
Anion gap: 10 (ref 5–15)
BUN: 23 mg/dL (ref 8–23)
CO2: 23 mmol/L (ref 22–32)
Calcium: 9 mg/dL (ref 8.9–10.3)
Chloride: 108 mmol/L (ref 98–111)
Creatinine: 1.17 mg/dL (ref 0.61–1.24)
GFR, Est AFR Am: >60 mL/min (ref >60)
GFR, Estimated: 59 mL/min — ABNORMAL LOW (ref >60)
Glucose, Bld: 116 mg/dL — ABNORMAL HIGH (ref 70–99)
Potassium: 4.3 mmol/L (ref 3.5–5.1)
Sodium: 141 mmol/L (ref 135–145)
Total Bilirubin: 0.5 mg/dL (ref 0.3–1.2)
Total Protein: 5.9 g/dL — ABNORMAL LOW (ref 6.5–8.1)

## 2020-06-03 LAB — CBC WITH DIFFERENTIAL/PLATELET
Abs Immature Granulocytes: 0.23 10*3/uL — ABNORMAL HIGH (ref 0.00–0.07)
Basophils Absolute: 0.1 10*3/uL (ref 0.0–0.1)
Basophils Relative: 1 %
Eosinophils Absolute: 0.4 10*3/uL (ref 0.0–0.5)
Eosinophils Relative: 5 %
HCT: 39.4 % (ref 39.0–52.0)
Hemoglobin: 12.8 g/dL — ABNORMAL LOW (ref 13.0–17.0)
Immature Granulocytes: 3 %
Lymphocytes Relative: 13 %
Lymphs Abs: 1.1 10*3/uL (ref 0.7–4.0)
MCH: 30.8 pg (ref 26.0–34.0)
MCHC: 32.5 g/dL (ref 30.0–36.0)
MCV: 94.7 fL (ref 80.0–100.0)
Monocytes Absolute: 0.7 10*3/uL (ref 0.1–1.0)
Monocytes Relative: 8 %
Neutro Abs: 6.3 10*3/uL (ref 1.7–7.7)
Neutrophils Relative %: 70 %
Platelets: 136 10*3/uL — ABNORMAL LOW (ref 150–400)
RBC: 4.16 MIL/uL — ABNORMAL LOW (ref 4.22–5.81)
RDW: 14.6 % (ref 11.5–15.5)
WBC: 8.9 10*3/uL (ref 4.0–10.5)
nRBC: 0 % (ref 0.0–0.2)

## 2020-06-03 MED ORDER — ZOLEDRONIC ACID 4 MG/100ML IV SOLN
4.0000 mg | Freq: Once | INTRAVENOUS | Status: AC
  Administered 2020-06-03: 4 mg via INTRAVENOUS

## 2020-06-03 MED ORDER — SODIUM CHLORIDE 0.9 % IV SOLN
Freq: Once | INTRAVENOUS | Status: AC
  Filled 2020-06-03: qty 250

## 2020-06-03 MED ORDER — ZOLEDRONIC ACID 4 MG/100ML IV SOLN
INTRAVENOUS | Status: AC
  Filled 2020-06-03: qty 100

## 2020-06-03 NOTE — Patient Instructions (Signed)
Zoledronic Acid injection (Hypercalcemia, Oncology) What is this medicine? ZOLEDRONIC ACID (ZOE le dron ik AS id) lowers the amount of calcium loss from bone. It is used to treat too much calcium in your blood from cancer. It is also used to prevent complications of cancer that has spread to the bone. This medicine may be used for other purposes; ask your health care provider or pharmacist if you have questions. COMMON BRAND NAME(S): Zometa What should I tell my health care provider before I take this medicine? They need to know if you have any of these conditions:  aspirin-sensitive asthma  cancer, especially if you are receiving medicines used to treat cancer  dental disease or wear dentures  infection  kidney disease  receiving corticosteroids like dexamethasone or prednisone  an unusual or allergic reaction to zoledronic acid, other medicines, foods, dyes, or preservatives  pregnant or trying to get pregnant  breast-feeding How should I use this medicine? This medicine is for infusion into a vein. It is given by a health care professional in a hospital or clinic setting. Talk to your pediatrician regarding the use of this medicine in children. Special care may be needed. Overdosage: If you think you have taken too much of this medicine contact a poison control center or emergency room at once. NOTE: This medicine is only for you. Do not share this medicine with others. What if I miss a dose? It is important not to miss your dose. Call your doctor or health care professional if you are unable to keep an appointment. What may interact with this medicine?  certain antibiotics given by injection  NSAIDs, medicines for pain and inflammation, like ibuprofen or naproxen  some diuretics like bumetanide, furosemide  teriparatide  thalidomide This list may not describe all possible interactions. Give your health care provider a list of all the medicines, herbs, non-prescription  drugs, or dietary supplements you use. Also tell them if you smoke, drink alcohol, or use illegal drugs. Some items may interact with your medicine. What should I watch for while using this medicine? Visit your doctor or health care professional for regular checkups. It may be some time before you see the benefit from this medicine. Do not stop taking your medicine unless your doctor tells you to. Your doctor may order blood tests or other tests to see how you are doing. Women should inform their doctor if they wish to become pregnant or think they might be pregnant. There is a potential for serious side effects to an unborn child. Talk to your health care professional or pharmacist for more information. You should make sure that you get enough calcium and vitamin D while you are taking this medicine. Discuss the foods you eat and the vitamins you take with your health care professional. Some people who take this medicine have severe bone, joint, and/or muscle pain. This medicine may also increase your risk for jaw problems or a broken thigh bone. Tell your doctor right away if you have severe pain in your jaw, bones, joints, or muscles. Tell your doctor if you have any pain that does not go away or that gets worse. Tell your dentist and dental surgeon that you are taking this medicine. You should not have major dental surgery while on this medicine. See your dentist to have a dental exam and fix any dental problems before starting this medicine. Take good care of your teeth while on this medicine. Make sure you see your dentist for regular follow-up   appointments. What side effects may I notice from receiving this medicine? Side effects that you should report to your doctor or health care professional as soon as possible:  allergic reactions like skin rash, itching or hives, swelling of the face, lips, or tongue  anxiety, confusion, or depression  breathing problems  changes in vision  eye  pain  feeling faint or lightheaded, falls  jaw pain, especially after dental work  mouth sores  muscle cramps, stiffness, or weakness  redness, blistering, peeling or loosening of the skin, including inside the mouth  trouble passing urine or change in the amount of urine Side effects that usually do not require medical attention (report to your doctor or health care professional if they continue or are bothersome):  bone, joint, or muscle pain  constipation  diarrhea  fever  hair loss  irritation at site where injected  loss of appetite  nausea, vomiting  stomach upset  trouble sleeping  trouble swallowing  weak or tired This list may not describe all possible side effects. Call your doctor for medical advice about side effects. You may report side effects to FDA at 1-800-FDA-1088. Where should I keep my medicine? This drug is given in a hospital or clinic and will not be stored at home. NOTE: This sheet is a summary. It may not cover all possible information. If you have questions about this medicine, talk to your doctor, pharmacist, or health care provider.  2020 [employer] (2014-01-26 14:19:39)  

## 2020-06-04 LAB — KAPPA/LAMBDA LIGHT CHAINS
Kappa free light chain: 6.9 mg/L (ref 3.3–19.4)
Kappa, lambda light chain ratio: 1.5 (ref 0.26–1.65)
Lambda free light chains: 4.6 mg/L — ABNORMAL LOW (ref 5.7–26.3)

## 2020-06-05 LAB — MULTIPLE MYELOMA PANEL, SERUM
Albumin SerPl Elph-Mcnc: 4 g/dL (ref 2.9–4.4)
Albumin/Glob SerPl: 1.7 (ref 0.7–1.7)
Alpha 1: 0.3 g/dL (ref 0.0–0.4)
Alpha2 Glob SerPl Elph-Mcnc: 0.7 g/dL (ref 0.4–1.0)
B-Globulin SerPl Elph-Mcnc: 1 g/dL (ref 0.7–1.3)
Gamma Glob SerPl Elph-Mcnc: 0.4 g/dL (ref 0.4–1.8)
Globulin, Total: 2.4 g/dL (ref 2.2–3.9)
IgA: 57 mg/dL — ABNORMAL LOW (ref 61–437)
IgG (Immunoglobin G), Serum: 276 mg/dL — ABNORMAL LOW (ref 603–1613)
IgM (Immunoglobulin M), Srm: 50 mg/dL (ref 15–143)
Total Protein ELP: 6.4 g/dL (ref 6.0–8.5)

## 2020-06-10 ENCOUNTER — Ambulatory Visit: Payer: Medicare Other | Admitting: Physical Therapy

## 2020-06-10 ENCOUNTER — Telehealth: Payer: Self-pay | Admitting: *Deleted

## 2020-06-10 ENCOUNTER — Other Ambulatory Visit: Payer: Self-pay

## 2020-06-10 DIAGNOSIS — I890 Lymphedema, not elsewhere classified: Secondary | ICD-10-CM | POA: Diagnosis not present

## 2020-06-10 DIAGNOSIS — R293 Abnormal posture: Secondary | ICD-10-CM

## 2020-06-10 DIAGNOSIS — R262 Difficulty in walking, not elsewhere classified: Secondary | ICD-10-CM | POA: Diagnosis not present

## 2020-06-10 DIAGNOSIS — D123 Benign neoplasm of transverse colon: Secondary | ICD-10-CM | POA: Diagnosis not present

## 2020-06-10 DIAGNOSIS — M6281 Muscle weakness (generalized): Secondary | ICD-10-CM | POA: Diagnosis not present

## 2020-06-10 DIAGNOSIS — R296 Repeated falls: Secondary | ICD-10-CM | POA: Diagnosis not present

## 2020-06-10 NOTE — Telephone Encounter (Signed)
Contacted patient regarding test results per Dr. Grier Mitts directions in message below. Mrs. Dilauro verbalized understanding

## 2020-06-10 NOTE — Telephone Encounter (Signed)
-----   Message from Brunetta Genera, MD sent at 06/09/2020 11:45 PM EDT ----- Plz let patient his myeloma labs suggested continued remission.

## 2020-06-10 NOTE — Therapy (Signed)
[place], [place], [address] Phone: [phone]   Fax:  [phone]  Physical Therapy Treatment  Patient Details  Name: [name] [name] MRN: [id number] Date of Birth: 02-21-1941 Referring Provider (PT): Dr. Irene Limbo    Encounter Date: 06/10/2020   PT End of Session - 06/10/20 1544    Visit Number 12    Number of Visits 20    Date for PT Re-Evaluation 06/15/20    PT Start Time 1410    PT Stop Time 1450    PT Time Calculation (min) 40 min    Activity Tolerance Patient tolerated treatment well    Behavior During Therapy [facility] for tasks assessed/performed           Past Medical History:  Diagnosis Date  . Ascending aortic aneurysm ([place])   . Bicuspid aortic valve   . Cancer ([place])   . CHF NYHA class I (no symptoms from ordinary activities), acute, diastolic ([place])   . Dysrhythmia 2009   A fib  . Fatty liver    mild  . Fracture    left proximal humerus  . GERD (gastroesophageal reflux disease)   . GI bleeding 07/21/2018   post polypectomy  . Hemorrhoids   . HTN (hypertension)   . Hypercholesteremia   . Hypokalemia   . Internal hemorrhoids   . LBP (low back pain)   . Moderate aortic stenosis   . Osteoarthritis   . Paroxysmal atrial fibrillation ([place])    a. new onset Afib in 07/2008. He underwent ibutilide cardioversion successfully. b. Recurrence 01/2013 s/p TEE/DCCV - was on Xarelto but he stopped it as he was convinced it was causing joint pn. c. Recurrence 01/2016 - spont conv to NSR. Pt took Eliquis x1 mo then declined further anticoag. d. Recurrence 07/2016.  [name] Pneumonia   . Tubular adenoma of colon     Past Surgical History:  Procedure Laterality Date  . BACK SURGERY  x12 years ago  . CARDIAC CATHETERIZATION  2020  . CARDIAC VALVE REPLACEMENT  2020  . CARDIOVERSION N/A 01/26/2013   Procedure: CARDIOVERSION;  Surgeon: Larey Dresser, MD;  Location: [facility] ENDOSCOPY;  Service: Cardiovascular;   Laterality: N/A;  . CARDIOVERSION N/A 10/28/2017   Procedure: CARDIOVERSION;  Surgeon: Larey Dresser, MD;  Location: [place] ENDOSCOPY;  Service: Cardiovascular;  Laterality: N/A;  . CARDIOVERSION N/A 03/03/2018   Procedure: CARDIOVERSION;  Surgeon: Lelon Perla, MD;  Location: [facility] ENDOSCOPY;  Service: Cardiovascular;  Laterality: N/A;  . CARDIOVERSION N/A 09/19/2019   Procedure: CARDIOVERSION;  Surgeon: Larey Dresser, MD;  Location: [facility] ENDOSCOPY;  Service: Cardiovascular;  Laterality: N/A;  . COLONOSCOPY    . COLONOSCOPY  07/17/2018   at [facility]  . HEMORRHOID SURGERY    . INGUINAL HERNIA REPAIR Left 03/18/2020   Procedure: OPEN LEFT INGUINAL HERNIA REPAIR;  Surgeon: Alphonsa Overall, MD;  Location: [place];  Service: General;  Laterality: Left;  . LUMBAR LAMINECTOMY    . ORIF HUMERUS FRACTURE Left 01/15/2020   Procedure: OPEN REDUCTION INTERNAL FIXATION (ORIF) PROXIMAL HUMERUS FRACTURE;  Surgeon: Nicholes Stairs, MD;  Location: [place];  Service: Orthopedics;  Laterality: Left;  . POLYPECTOMY    . RIGHT HEART CATH N/A 08/20/2019   Procedure: RIGHT HEART CATH;  Surgeon: Larey Dresser, MD;  Location: [place] CV LAB;  Service: Cardiovascular;  Laterality: N/A;  . RIGHT/LEFT HEART CATH AND CORONARY ANGIOGRAPHY N/A 03/07/2019   Procedure: RIGHT/LEFT HEART CATH AND CORONARY ANGIOGRAPHY;  Surgeon: Burnell Blanks, MD;  Location: [place] CV LAB;  Service: Cardiovascular;  Laterality: N/A;  . [name]  04/2019  . TEE WITHOUT CARDIOVERSION N/A 01/26/2013   Procedure: TRANSESOPHAGEAL ECHOCARDIOGRAM (TEE);  Surgeon: Larey Dresser, MD;  Location: [place];  Service: Cardiovascular;  Laterality: N/A;  . TEE WITHOUT CARDIOVERSION N/A 10/28/2017   Procedure: TRANSESOPHAGEAL ECHOCARDIOGRAM (TEE);  Surgeon: Larey Dresser, MD;  Location: [facility] ENDOSCOPY;  Service: Cardiovascular;  Laterality: N/A;  . TEE WITHOUT CARDIOVERSION N/A 05/08/2019   Procedure: TRANSESOPHAGEAL ECHOCARDIOGRAM (TEE);   Surgeon: Burnell Blanks, MD;  Location: [place] CV LAB;  Service: Open Heart Surgery;  Laterality: N/A;  . TEE WITHOUT CARDIOVERSION N/A 09/19/2019   Procedure: TRANSESOPHAGEAL ECHOCARDIOGRAM (TEE);  Surgeon: Larey Dresser, MD;  Location: [facility] ENDOSCOPY;  Service: Cardiovascular;  Laterality: N/A;  . TRANSCATHETER AORTIC VALVE REPLACEMENT, TRANSFEMORAL N/A 05/08/2019   Procedure: TRANSCATHETER AORTIC VALVE REPLACEMENT, TRANSFEMORAL;  Surgeon: Burnell Blanks, MD;  Location: [place] CV LAB;  Service: Open Heart Surgery;  Laterality: N/A;    There were no vitals filed for this visit.   Subjective Assessment - 06/10/20 1533    Subjective Pt went to see Dr. Irene Limbo.  He continues to have pain in his feet and is very discouraged by that.  He has tried aquatic therapy in the past and does not want to do that again    Patient is accompained by: Family member    Pertinent History During preliminary work up for valve replacement in July of 2020 it was discovered that pt had large lytic lesions in his pelvis due to  multiple myeloma.  He had a TAVR in August 2021  and started chemotherapy for multiple myeloma which was successful in reducing the lestions. However,  he developed peripheral neuropathy and chemotherapy is being adjusted. He has multiple hospitalizations in dec-jan 2021 for septic colitis with acute kidney injury and pneumonia with associated deconditioning and difficutly walking due to Chemotherapy induced peripheral neuropathy.  Pt  had home health PT and continues to walk with a cane. Past history also significant for afib and back surgery.  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    Patient Stated Goals To get relief with pain in feet and decrease swelling, get more stable  and walk without the came    Currently in Pain? Yes    Pain Score --   did not rate                            [facility] Adult PT Treatment/Exercise - 06/10/20 0001       Exercises   Exercises Shoulder;Knee/Hip;Ankle    Other Exercises  began with deep breathing and arms overhead for thoracic extension in sitting       Lumbar Exercises: Quadruped   Straight Leg Raise 5 reps   with each leg    Other Quadruped Lumbar Exercises rocking back and forth for closed chain stabalization at hips and shoulders       Knee/Hip Exercises: Standing   Other Standing Knee Exercises 5 rounds of 5 squats alternating with 5 reps of  varying UE exercises       Knee/Hip Exercises: Sidelying   Hip ABduction Strengthening;Right;Left;10 reps    Clams --   10 reps on each side   Other Sidelying Knee/Hip Exercises manual resistance for hip extension 10 reps on each leg. Pt with not c/o  pain     Other Sidelying Knee/Hip Exercises alternating isometrices to trunk forward and backward rolling x 10 reps for 5 sec holds       Shoulder Exercises: Sidelying   Other Sidelying Exercises abduction to 90 degrees                     PT Short Term Goals - 05/21/20 1203      PT SHORT TERM GOAL #1   Title Pt and wife feel that they are able to manage pt feet and ankle swelling at home    Baseline wife feels she she knows what to do, pt feels he is still working on it    Status On-going      PT Brave #2   Title Pt will increase # of reps of sit to stand to 10 reps in 30 seconds indicating an improvment in functional strength    Baseline 5 reps in 30 seconds on 12/31/2019, 10 reps in 30 seconds without use of hands    Status Achieved      PT SHORT TERM GOAL #3   Title Pt will decrease normal TUG score to < 14 seconds indicating an improvmemnt in functional mobility    Baseline 25.35 sec on 12/31/2019, 16.47 sec on 04/15/20, 05/21/2020 10.42 sec    Status Achieved             PT Long Term Goals - 05/21/20 1211      PT LONG TERM GOAL #1   Title Pt will report the pain in his legs is decreased to 4/10    Baseline 6/10 on eval, 5/10 at 04/15/2020, 05/21/2020 pain is 7/10      Time 8    Period Weeks    Status On-going      PT LONG TERM GOAL #2   Title Pt will report his tiredness with walking is decreased by 50%    Baseline 05/21/2020.  Pt is improved by about 30%    Time 8    Period Weeks    Status On-going      PT LONG TERM GOAL #3   Title Pt will increase # of reps of sit to stand to 10 reps in 30 seconds indicating an improvment in functional strength    Baseline 5 reps in 30 secons on 12/31/2019, 8 reps on 04/15/2020, 10 reps on 05/21/2020    Status Achieved      PT LONG TERM GOAL #4   Title Pt will decrease normal TUG score to < 12  seconds indicating an improvmemnt in functional mobility    Baseline 25.35 on 12/31/2019, 16.46. on 04/15/2020, 10 42 on 9/82021    Status Achieved      PT LONG TERM GOAL #5   Title Pt will increae BERG balancer score to > 46 for moderate risk for fall    Baseline 01/07/20 score is 36 and < 36 is high risk for falls if walking without at device ( close to 100%), 04/15/2020 score is 41    Status On-going      PT LONG TERM GOAL #6   Title Pt will be able to do a floor to stand transfer with min assist    Time 8    Period Weeks    Status On-going                 Plan - 06/10/20 1546    Clinical Impression Statement Pt continues to be  discouraged about his leg pain, but his wife states she thinks it is improving and that his leg edema may be decreasing after discontinueation of a medication and intermittent use of edema wear.  Pt responds well to exercise and always feels better after an exercise session.  He is scheduled for twice a week for the next month and is committed to exercising during this time and hopeful he will see improvment.    Comorbidities a fib, recent sepsis, kidney injury and pneumonia, ongoing chemo, past back surgery    Examination-Activity Limitations Stand;Transfers;Stairs    Stability/Clinical Decision Making Evolving/Moderate complexity    Rehab Potential Good    PT Frequency 2x / week    PT  Treatment/Interventions ADLs/Self Care Home Management;DME Instruction;Gait training;Stair training;Balance training;Therapeutic exercise;Therapeutic activities;Functional mobility training;Neuromuscular re-education;Cognitive remediation;Patient/family education;Manual lymph drainage;Orthotic Fit/Training;Manual techniques;Compression bandaging;Passive range of motion;Taping    PT Next Visit Plan send recert . Ask pt what exercise he would like to employ use interval training approach with at machines or with bodyweight/free weights. to  continue strengthening and work on floor to stand transfers  Associate Professor. prn  Gait training  change direction, change speeds, direction, etc, stair training.  Progress to UE and LE strengthening, then standing work for balance and strength and advanced gait.    PT Home Exercise Plan supine scap exercise and standing squats, hip abduction adn wall push ups    Consulted and Agree with Plan of Care Patient    Family Member Consulted wife           Patient will benefit from skilled therapeutic intervention in order to improve the following deficits and impairments:  Abnormal gait, Decreased balance, Decreased endurance, Decreased mobility, Difficulty walking, Impaired sensation, Impaired perceived functional ability, Increased edema, Decreased knowledge of precautions, Decreased activity tolerance, Decreased strength, Pain, Postural dysfunction  Visit Diagnosis: Muscle weakness (generalized)  Difficulty in walking, not elsewhere classified  Abnormal posture  Repeated falls     Problem List Patient Active Problem List   Diagnosis Date Noted  . Edema 05/20/2020  . Closed fracture of left proximal humerus 01/15/2020  . Acute cystitis without hematuria 11/07/2019  . Left inguinal hernia 11/07/2019  . Physical deconditioning   . Depression with anxiety   . Acute respiratory failure with hypoxia ([place])   . Ischemic colitis ([place]) 09/18/2019  . Pressure  injury of skin 09/16/2019  . Bloody diarrhea 09/14/2019  . Abdominal pain, diffuse 09/14/2019  . Acute metabolic encephalopathy 44/11/4740  . Goals of care, counseling/discussion 09/14/2019  . DNR (do not resuscitate) 09/14/2019  . Peripheral neuropathy 09/13/2019  . Gait disorder 09/13/2019  . Multiple myeloma not having achieved remission ([place]) 05/17/2019  . Counseling regarding advance care planning and goals of care 05/17/2019  . Bone metastases ([place]) 05/17/2019  . Postoperative fever 05/09/2019  . S/P TAVR (transcatheter aortic valve replacement) 05/08/2019  . Multiple myeloma ([place]) 05/08/2019  . Acute on chronic diastolic heart failure ([place]) 05/08/2019  . Severe aortic stenosis   . CAD (coronary artery disease) 08/01/2018  . Hematochezia 07/27/2018  . HTN (hypertension) 02/28/2018  . Hypercholesteremia 02/28/2018  . Degeneration of lumbar intervertebral disc 12/06/2017  . Adenomatous polyp of ascending colon 12/02/2017  . Chronic low back pain 09/26/2017  . Mastoiditis 03/21/2017  . Abnormal TSH 12/23/2016  . PNA (pneumonia)   . AKI (acute kidney injury) ([place])   . Ascending aortic aneurysm ([place]) 02/15/2013  . Atrial fibrillation with RVR ([place]) 01/23/2013  . Well adult exam  05/31/2012  . PAF (paroxysmal atrial fibrillation) ([place]) 08/05/2008  . HLD (hyperlipidemia) 04/08/2007  . Essential hypertension 04/08/2007   Donato Heinz. Owens Shark PT  Norwood Levo 06/10/2020, 3:52 PM  [place], [place], [address] Phone: [phone]   Fax:  [phone]  Name: Deitrick Ferreri MRN: [id number] Date of Birth: 12-23-1940

## 2020-06-12 ENCOUNTER — Ambulatory Visit: Payer: Medicare Other | Admitting: Physical Therapy

## 2020-06-12 ENCOUNTER — Other Ambulatory Visit: Payer: Self-pay

## 2020-06-12 DIAGNOSIS — I890 Lymphedema, not elsewhere classified: Secondary | ICD-10-CM

## 2020-06-12 DIAGNOSIS — M6281 Muscle weakness (generalized): Secondary | ICD-10-CM

## 2020-06-12 DIAGNOSIS — R293 Abnormal posture: Secondary | ICD-10-CM | POA: Diagnosis not present

## 2020-06-12 DIAGNOSIS — R296 Repeated falls: Secondary | ICD-10-CM | POA: Diagnosis not present

## 2020-06-12 DIAGNOSIS — R262 Difficulty in walking, not elsewhere classified: Secondary | ICD-10-CM

## 2020-06-12 NOTE — Therapy (Signed)
[place], [place], [address] Phone: [phone]   Fax:  [phone]  Physical Therapy Treatment  Patient Details  Name: [name] [name] MRN: [id number] Date of Birth: [birth year] Referring Provider (PT): Dr. Irene Limbo    Encounter Date: 06/12/2020   PT End of Session - 06/12/20 1413    Visit Number 13    Number of Visits 20    Date for PT Re-Evaluation 06/15/20    PT Start Time 4944    PT Stop Time 1350    PT Time Calculation (min) 45 min    Activity Tolerance Patient tolerated treatment well    Behavior During Therapy [facility] for tasks assessed/performed           Past Medical History:  Diagnosis Date  . Ascending aortic aneurysm ([place])   . Bicuspid aortic valve   . Cancer ([place])   . CHF NYHA class I (no symptoms from ordinary activities), acute, diastolic ([place])   . Dysrhythmia 2009   A fib  . Fatty liver    mild  . Fracture    left proximal humerus  . GERD (gastroesophageal reflux disease)   . GI bleeding 07/21/2018   post polypectomy  . Hemorrhoids   . HTN (hypertension)   . Hypercholesteremia   . Hypokalemia   . Internal hemorrhoids   . LBP (low back pain)   . Moderate aortic stenosis   . Osteoarthritis   . Paroxysmal atrial fibrillation ([place])    a. new onset Afib in 07/2008. He underwent ibutilide cardioversion successfully. b. Recurrence 01/2013 s/p TEE/DCCV - was on Xarelto but he stopped it as he was convinced it was causing joint pn. c. Recurrence 01/2016 - spont conv to NSR. Pt took Eliquis x1 mo then declined further anticoag. d. Recurrence 07/2016.  [name] Pneumonia   . Tubular adenoma of colon     Past Surgical History:  Procedure Laterality Date  . BACK SURGERY  x12 years ago  . CARDIAC CATHETERIZATION  2020  . CARDIAC VALVE REPLACEMENT  2020  . CARDIOVERSION N/A 01/26/2013   Procedure: CARDIOVERSION;  Surgeon: Larey Dresser, MD;  Location: [facility] ENDOSCOPY;  Service: Cardiovascular;   Laterality: N/A;  . CARDIOVERSION N/A 10/28/2017   Procedure: CARDIOVERSION;  Surgeon: Larey Dresser, MD;  Location: [facility] ENDOSCOPY;  Service: Cardiovascular;  Laterality: N/A;  . CARDIOVERSION N/A 03/03/2018   Procedure: CARDIOVERSION;  Surgeon: Lelon Perla, MD;  Location: [facility] ENDOSCOPY;  Service: Cardiovascular;  Laterality: N/A;  . CARDIOVERSION N/A 09/19/2019   Procedure: CARDIOVERSION;  Surgeon: Larey Dresser, MD;  Location: [facility] ENDOSCOPY;  Service: Cardiovascular;  Laterality: N/A;  . COLONOSCOPY    . COLONOSCOPY  07/17/2018   at [facility]  . HEMORRHOID SURGERY    . INGUINAL HERNIA REPAIR Left 03/18/2020   Procedure: OPEN LEFT INGUINAL HERNIA REPAIR;  Surgeon: Alphonsa Overall, MD;  Location: [place];  Service: General;  Laterality: Left;  . LUMBAR LAMINECTOMY    . ORIF HUMERUS FRACTURE Left 01/15/2020   Procedure: OPEN REDUCTION INTERNAL FIXATION (ORIF) PROXIMAL HUMERUS FRACTURE;  Surgeon: Nicholes Stairs, MD;  Location: [place];  Service: Orthopedics;  Laterality: Left;  . POLYPECTOMY    . RIGHT HEART CATH N/A 08/20/2019   Procedure: RIGHT HEART CATH;  Surgeon: Larey Dresser, MD;  Location: [place] CV LAB;  Service: Cardiovascular;  Laterality: N/A;  . RIGHT/LEFT HEART CATH AND CORONARY ANGIOGRAPHY N/A 03/07/2019   Procedure: RIGHT/LEFT HEART CATH AND CORONARY ANGIOGRAPHY;  Surgeon: Burnell Blanks, MD;  Location: [place] CV LAB;  Service: Cardiovascular;  Laterality: N/A;  . [name]  04/2019  . TEE WITHOUT CARDIOVERSION N/A 01/26/2013   Procedure: TRANSESOPHAGEAL ECHOCARDIOGRAM (TEE);  Surgeon: Larey Dresser, MD;  Location: [place];  Service: Cardiovascular;  Laterality: N/A;  . TEE WITHOUT CARDIOVERSION N/A 10/28/2017   Procedure: TRANSESOPHAGEAL ECHOCARDIOGRAM (TEE);  Surgeon: Larey Dresser, MD;  Location: [facility] ENDOSCOPY;  Service: Cardiovascular;  Laterality: N/A;  . TEE WITHOUT CARDIOVERSION N/A 05/08/2019   Procedure: TRANSESOPHAGEAL ECHOCARDIOGRAM (TEE);   Surgeon: Burnell Blanks, MD;  Location: [place] CV LAB;  Service: Open Heart Surgery;  Laterality: N/A;  . TEE WITHOUT CARDIOVERSION N/A 09/19/2019   Procedure: TRANSESOPHAGEAL ECHOCARDIOGRAM (TEE);  Surgeon: Larey Dresser, MD;  Location: [facility] ENDOSCOPY;  Service: Cardiovascular;  Laterality: N/A;  . TRANSCATHETER AORTIC VALVE REPLACEMENT, TRANSFEMORAL N/A 05/08/2019   Procedure: TRANSCATHETER AORTIC VALVE REPLACEMENT, TRANSFEMORAL;  Surgeon: Burnell Blanks, MD;  Location: [place] CV LAB;  Service: Open Heart Surgery;  Laterality: N/A;    There were no vitals filed for this visit.   Subjective Assessment - 06/12/20 1310    Subjective Pt says he is doing well today.  He drove himself here and is wearing his tennis shoes . He carries his cane, but isn't using it much    Pertinent History During preliminary work up for valve replacement in July of 2020 it was discovered that pt had large lytic lesions in his pelvis due to  multiple myeloma.  He had a TAVR in August 2021  and started chemotherapy for multiple myeloma which was successful in reducing the lestions. However,  he developed peripheral neuropathy and chemotherapy is being adjusted. He has multiple hospitalizations in dec-jan 2021 for septic colitis with acute kidney injury and pneumonia with associated deconditioning and difficutly walking due to Chemotherapy induced peripheral neuropathy.  Pt  had home health PT and continues to walk with a cane. Past history also significant for afib and back surgery.  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    Patient Stated Goals To get relief with pain in feet and decrease swelling, get more stable  and walk without the came    Currently in Pain? Yes    Pain Score 6     Pain Location Foot    Pain Orientation Right;Left    Pain Descriptors / Indicators --   "Itching"   Aggravating Factors  shoes make it worse, but he is tolerating it    Pain Relieving Factors rest                                [facility] Adult PT Treatment/Exercise - 06/12/20 0001      Ambulation/Gait   Ambulation/Gait Yes    Ambulation/Gait Assistance 5: Supervision    Ambulation/Gait Assistance Details cues to keep chest up and add arm swing for gait     Ambulation Distance (Feet) 500 Feet    Assistive device None    Ambulation Surface Level;Unlevel;Indoor;Outdoor;Grass    Gait Comments also added sudden stops, turns, changing speeds       Exercises   Exercises Shoulder;Other Exercises    Other Exercises  2 rounds of interval training on [employer] app  20 seconds exercise, 20 seconds rest for wall push ups,, squats, 4# bicep curls, lunges, dead lift with 4#       Lumbar  Exercises: Aerobic   Stationary Bike 4 minutes at level one , HR 109, O2 sat 96      Shoulder Exercises: Standing   Row Strengthening;Right;Left;10 reps;Theraband    Theraband Level (Shoulder Row) Level 3 (Green)    Row Limitations 10 reps bilateral and 10 reps unitlateral with elbows straight, thn 10 reps with elbows bent                     PT Short Term Goals - 05/21/20 1203      PT SHORT TERM GOAL #1   Title Pt and wife feel that they are able to manage pt feet and ankle swelling at home    Baseline wife feels she she knows what to do, pt feels he is still working on it    Status On-going      PT [place] #2   Title Pt will increase # of reps of sit to stand to 10 reps in 30 seconds indicating an improvment in functional strength    Baseline 5 reps in 30 seconds on 12/31/2019, 10 reps in 30 seconds without use of hands    Status Achieved      PT SHORT TERM GOAL #3   Title Pt will decrease normal TUG score to < 14 seconds indicating an improvmemnt in functional mobility    Baseline 25.35 sec on 12/31/2019, 16.47 sec on 04/15/20, 05/21/2020 10.42 sec    Status Achieved             PT Long Term Goals - 05/21/20 1211      PT LONG TERM GOAL #1   Title Pt will report the  pain in his legs is decreased to 4/10    Baseline 6/10 on eval, 5/10 at 04/15/2020, 05/21/2020 pain is 7/10    Time 8    Period Weeks    Status On-going      PT LONG TERM GOAL #2   Title Pt will report his tiredness with walking is decreased by 50%    Baseline 05/21/2020.  Pt is improved by about 30%    Time 8    Period Weeks    Status On-going      PT LONG TERM GOAL #3   Title Pt will increase # of reps of sit to stand to 10 reps in 30 seconds indicating an improvment in functional strength    Baseline 5 reps in 30 secons on 12/31/2019, 8 reps on 04/15/2020, 10 reps on 05/21/2020    Status Achieved      PT LONG TERM GOAL #4   Title Pt will decrease normal TUG score to < 12  seconds indicating an improvmemnt in functional mobility    Baseline 25.35 on 12/31/2019, 16.46. on 04/15/2020, 10 42 on 9/82021    Status Achieved      PT LONG TERM GOAL #5   Title Pt will increae BERG balancer score to > 46 for moderate risk for fall    Baseline 01/07/20 score is 36 and < 36 is high risk for falls if walking without at device ( close to 100%), 04/15/2020 score is 41    Status On-going      PT LONG TERM GOAL #6   Title Pt will be able to do a floor to stand transfer with min assist    Time 8    Period Weeks    Status On-going  Plan - 06/12/20 1417    PT Next Visit Plan assess goals and send recert . Ask pt what exercise he would like to employ use interval training approach with at machines or with bodyweight/free weights. to  continue strengthening and work on floor to stand transfers  Associate Professor. prn  Gait training  change direction, change speeds, direction, etc, stair training.  Progress to UE and LE strengthening, then standing work for balance and strength and advanced gait.    PT Home Exercise Plan supine scap exercise and standing squats, hip abduction and wall push ups    Consulted and Agree with Plan of Care Patient           Patient will benefit from skilled  therapeutic intervention in order to improve the following deficits and impairments:  Abnormal gait, Decreased balance, Decreased endurance, Decreased mobility, Difficulty walking, Impaired sensation, Impaired perceived functional ability, Increased edema, Decreased knowledge of precautions, Decreased activity tolerance, Decreased strength, Pain, Postural dysfunction  Visit Diagnosis: Muscle weakness (generalized)  Difficulty in walking, not elsewhere classified  Abnormal posture  Repeated falls  Lymphedema, not elsewhere classified     Problem List Patient Active Problem List   Diagnosis Date Noted  . Edema 05/20/2020  . Closed fracture of left proximal humerus 01/15/2020  . Acute cystitis without hematuria 11/07/2019  . Left inguinal hernia 11/07/2019  . Physical deconditioning   . Depression with anxiety   . Acute respiratory failure with hypoxia ([place])   . Ischemic colitis ([place]) 09/18/2019  . Pressure injury of skin 09/16/2019  . Bloody diarrhea 09/14/2019  . Abdominal pain, diffuse 09/14/2019  . Acute metabolic encephalopathy 34/19/3790  . Goals of care, counseling/discussion 09/14/2019  . DNR (do not resuscitate) 09/14/2019  . Peripheral neuropathy 09/13/2019  . Gait disorder 09/13/2019  . Multiple myeloma not having achieved remission ([place]) 05/17/2019  . Counseling regarding advance care planning and goals of care 05/17/2019  . Bone metastases ([place]) 05/17/2019  . Postoperative fever 05/09/2019  . S/P TAVR (transcatheter aortic valve replacement) 05/08/2019  . Multiple myeloma ([place]) 05/08/2019  . Acute on chronic diastolic heart failure ([place]) 05/08/2019  . Severe aortic stenosis   . CAD (coronary artery disease) 08/01/2018  . Hematochezia 07/27/2018  . HTN (hypertension) 02/28/2018  . Hypercholesteremia 02/28/2018  . Degeneration of lumbar intervertebral disc 12/06/2017  . Adenomatous polyp of ascending colon 12/02/2017  . Chronic low back pain 09/26/2017  .  Mastoiditis 03/21/2017  . Abnormal TSH 12/23/2016  . PNA (pneumonia)   . AKI (acute kidney injury) ([place])   . Ascending aortic aneurysm ([place]) 02/15/2013  . Atrial fibrillation with RVR ([place]) 01/23/2013  . Well adult exam 05/31/2012  . PAF (paroxysmal atrial fibrillation) ([place]) 08/05/2008  . HLD (hyperlipidemia) 04/08/2007  . Essential hypertension 04/08/2007   [name] [name] PT  [name] [name] 06/12/2020, 2:21 PM  [place], [place], [address] Phone: [phone]   Fax:  [phone]  Name: [name] [name] MRN: [id number] Date of Birth: [birth year]

## 2020-06-13 ENCOUNTER — Telehealth: Payer: Self-pay | Admitting: *Deleted

## 2020-06-13 DIAGNOSIS — Z23 Encounter for immunization: Secondary | ICD-10-CM | POA: Diagnosis not present

## 2020-06-13 NOTE — Telephone Encounter (Signed)
   [facility] Pre-operative Risk Assessment    HEARTCARE STAFF: - Please ensure there is not already an duplicate clearance open for this procedure. - Under Visit Info/Reason for Call, type in Other and utilize the format Clearance MM/DD/YY or Clearance TBD. Do not use dashes or single digits. - If request is for dental extraction, please clarify the # of teeth to be extracted.  Request for surgical clearance:  1. What type of surgery is being performed? ENDOSCOPIC MUCOSAL RESECTION   2. When is this surgery scheduled? 07/23/20   3. What type of clearance is required (medical clearance vs. Pharmacy clearance to hold med vs. Both)? BOTH  4. Are there any medications that need to be held prior to surgery and how long? ELIQUIS x 2,3 OR 4 DAYS PRIOR PRIOR   5. Practice name and name of physician performing surgery? [place]; MD IS NOT LISTED   6. What is the office phone number? [phone]   7.   What is the office fax number? [phone]  8.   Anesthesia type (None, local, MAC, general) ? NOT LISTED   Julaine Hua 06/13/2020, 4:13 PM  _________________________________________________________________   (provider comments below)

## 2020-06-16 ENCOUNTER — Telehealth: Payer: Self-pay | Admitting: Internal Medicine

## 2020-06-16 ENCOUNTER — Ambulatory Visit: Payer: Medicare Other | Attending: Hematology | Admitting: Physical Therapy

## 2020-06-16 ENCOUNTER — Other Ambulatory Visit: Payer: Self-pay

## 2020-06-16 DIAGNOSIS — R296 Repeated falls: Secondary | ICD-10-CM | POA: Diagnosis not present

## 2020-06-16 DIAGNOSIS — R293 Abnormal posture: Secondary | ICD-10-CM | POA: Diagnosis not present

## 2020-06-16 DIAGNOSIS — I890 Lymphedema, not elsewhere classified: Secondary | ICD-10-CM | POA: Insufficient documentation

## 2020-06-16 DIAGNOSIS — M6281 Muscle weakness (generalized): Secondary | ICD-10-CM | POA: Insufficient documentation

## 2020-06-16 DIAGNOSIS — R262 Difficulty in walking, not elsewhere classified: Secondary | ICD-10-CM | POA: Insufficient documentation

## 2020-06-16 NOTE — Telephone Encounter (Signed)
Patient with diagnosis of afib on Eliquis for anticoagulation.    Procedure: ENDOSCOPIC MUCOSAL RESECTION  Date of procedure: 07/23/20  CHADS2-VASc score of  5 (CHF, HTN, AGE, CAD, AGE)  CrCl 53 ml/min  Per office protocol, patient can hold Eliquis for 2 days prior to procedure.

## 2020-06-16 NOTE — Telephone Encounter (Signed)
Left VM

## 2020-06-16 NOTE — Therapy (Signed)
[name] [name], [place], [address] Phone: [phone]   Fax:  [phone]  Physical Therapy Treatment  Patient Details  Name: [name] [name] MRN: [id number] Date of Birth: 24-Sep-1940 Referring Provider (PT): Dr. Irene Limbo    Encounter Date: 06/16/2020   PT End of Session - 06/16/20 1222    Visit Number 14    Number of Visits 36    Date for PT Re-Evaluation 08/18/20    PT Start Time 1100    PT Stop Time 1145    PT Time Calculation (min) 45 min    Activity Tolerance Patient tolerated treatment well    Behavior During Therapy [facility] for tasks assessed/performed           Past Medical History:  Diagnosis Date  . Ascending aortic aneurysm ([place])   . Bicuspid aortic valve   . Cancer ([place])   . CHF NYHA class I (no symptoms from ordinary activities), acute, diastolic ([place])   . Dysrhythmia 2009   A fib  . Fatty liver    mild  . Fracture    left proximal humerus  . GERD (gastroesophageal reflux disease)   . GI bleeding 07/21/2018   post polypectomy  . Hemorrhoids   . HTN (hypertension)   . Hypercholesteremia   . Hypokalemia   . Internal hemorrhoids   . LBP (low back pain)   . Moderate aortic stenosis   . Osteoarthritis   . Paroxysmal atrial fibrillation ([place])    a. new onset Afib in 07/2008. He underwent ibutilide cardioversion successfully. b. Recurrence 01/2013 s/p TEE/DCCV - was on Xarelto but he stopped it as he was convinced it was causing joint pn. c. Recurrence 01/2016 - spont conv to NSR. Pt took Eliquis x1 mo then declined further anticoag. d. Recurrence 07/2016.  [name] Pneumonia   . Tubular adenoma of colon     Past Surgical History:  Procedure Laterality Date  . BACK SURGERY  x12 years ago  . CARDIAC CATHETERIZATION  2020  . CARDIAC VALVE REPLACEMENT  2020  . CARDIOVERSION N/A 01/26/2013   Procedure: CARDIOVERSION;  Surgeon: Larey Dresser, MD;  Location: [facility] ENDOSCOPY;  Service: Cardiovascular;   Laterality: N/A;  . CARDIOVERSION N/A 10/28/2017   Procedure: CARDIOVERSION;  Surgeon: Larey Dresser, MD;  Location: [facility] ENDOSCOPY;  Service: Cardiovascular;  Laterality: N/A;  . CARDIOVERSION N/A 03/03/2018   Procedure: CARDIOVERSION;  Surgeon: Lelon Perla, MD;  Location: [facility] ENDOSCOPY;  Service: Cardiovascular;  Laterality: N/A;  . CARDIOVERSION N/A 09/19/2019   Procedure: CARDIOVERSION;  Surgeon: Larey Dresser, MD;  Location: [facility] ENDOSCOPY;  Service: Cardiovascular;  Laterality: N/A;  . COLONOSCOPY    . COLONOSCOPY  07/17/2018   at [facility]  . HEMORRHOID SURGERY    . INGUINAL HERNIA REPAIR Left 03/18/2020   Procedure: OPEN LEFT INGUINAL HERNIA REPAIR;  Surgeon: Alphonsa Overall, MD;  Location: [place];  Service: General;  Laterality: Left;  . LUMBAR LAMINECTOMY    . ORIF HUMERUS FRACTURE Left 01/15/2020   Procedure: OPEN REDUCTION INTERNAL FIXATION (ORIF) PROXIMAL HUMERUS FRACTURE;  Surgeon: Nicholes Stairs, MD;  Location: [place];  Service: Orthopedics;  Laterality: Left;  . POLYPECTOMY    . RIGHT HEART CATH N/A 08/20/2019   Procedure: RIGHT HEART CATH;  Surgeon: Larey Dresser, MD;  Location: [place] CV LAB;  Service: Cardiovascular;  Laterality: N/A;  . RIGHT/LEFT HEART CATH AND CORONARY ANGIOGRAPHY N/A 03/07/2019   Procedure: RIGHT/LEFT HEART CATH AND CORONARY ANGIOGRAPHY;  Surgeon: Burnell Blanks, MD;  Location: [place] CV LAB;  Service: Cardiovascular;  Laterality: N/A;  . [name] [name]  04/2019  . TEE WITHOUT CARDIOVERSION N/A 01/26/2013   Procedure: TRANSESOPHAGEAL ECHOCARDIOGRAM (TEE);  Surgeon: Larey Dresser, MD;  Location: [place];  Service: Cardiovascular;  Laterality: N/A;  . TEE WITHOUT CARDIOVERSION N/A 10/28/2017   Procedure: TRANSESOPHAGEAL ECHOCARDIOGRAM (TEE);  Surgeon: Larey Dresser, MD;  Location: [facility] ENDOSCOPY;  Service: Cardiovascular;  Laterality: N/A;  . TEE WITHOUT CARDIOVERSION N/A 05/08/2019   Procedure: TRANSESOPHAGEAL ECHOCARDIOGRAM (TEE);   Surgeon: Burnell Blanks, MD;  Location: [place] CV LAB;  Service: Open Heart Surgery;  Laterality: N/A;  . TEE WITHOUT CARDIOVERSION N/A 09/19/2019   Procedure: TRANSESOPHAGEAL ECHOCARDIOGRAM (TEE);  Surgeon: Larey Dresser, MD;  Location: [facility] ENDOSCOPY;  Service: Cardiovascular;  Laterality: N/A;  . TRANSCATHETER AORTIC VALVE REPLACEMENT, TRANSFEMORAL N/A 05/08/2019   Procedure: TRANSCATHETER AORTIC VALVE REPLACEMENT, TRANSFEMORAL;  Surgeon: Burnell Blanks, MD;  Location: [place] CV LAB;  Service: Open Heart Surgery;  Laterality: N/A;    There were no vitals filed for this visit.   Subjective Assessment - 06/16/20 1105    Subjective Pt state that after exercise his legs were "terrible"  yesterday.   He was pretty good the day after exercise,  The swelling in his feet has gone day    Pertinent History During preliminary work up for valve replacement in July of 2020 it was discovered that pt had large lytic lesions in his pelvis due to  multiple myeloma.  He had a TAVR in August 2021  and started chemotherapy for multiple myeloma which was successful in reducing the lestions. However,  he developed peripheral neuropathy and chemotherapy is being adjusted. He has multiple hospitalizations in dec-jan 2021 for septic colitis with acute kidney injury and pneumonia with associated deconditioning and difficutly walking due to Chemotherapy induced peripheral neuropathy.  Pt  had home health PT and continues to walk with a cane. Past history also significant for afib and back surgery.  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    Patient Stated Goals To get relief with pain in feet and decrease swelling, get more stable  and walk without the came    Currently in Pain? Yes    Pain Score 5     Pain Location Foot    Pain Orientation Right;Left              [facility] PT Assessment - 06/16/20 0001      Assessment   Medical Diagnosis multiple myeloma     Referring Provider  (PT) Dr. Irene Limbo     Onset Date/Surgical Date 04/14/19      Prior Function   Level of Independence Independent with household mobility with device   straight cane                        [facility] Adult PT Treatment/Exercise - 06/16/20 0001      Ambulation/Gait   Ambulation/Gait Yes    Ambulation/Gait Assistance Details cues to emphasize heel strike, increase stride length and swing arms     Assistive device None      High Level Balance   High Level Balance Activities Backward walking;Direction changes;Turns;Sudden stops;Marching forwards      Exercises   Exercises Shoulder;Other Exercises    Other Exercises  2 rounds of interval training on Tabata app  20 seconds exercise, 20 seconds rest for wall  push ups,6 in step ups,  8#  dead lift    arm elevation to 90 degrees with retraction       Lumbar Exercises: Aerobic   Nustep 5 minutes at level 3       Lumbar Exercises: Standing   Forward Lunge 10 reps      Shoulder Exercises: Standing   External Rotation Strengthening;Right;Left;10 reps    External Rotation Limitations with free motion cable machine with 7 pounds     Row Strengthening;Both;10 reps    Row Limitations with free motion cable machine with 7#                     PT Short Term Goals - 06/16/20 1205      PT SHORT TERM GOAL #1   Title Pt and wife feel that they are able to manage pt feet and ankle swelling at home    Baseline wife states she feels the swelling is decreasing. They are using [place] and doing massage as needed    Status Achieved      PT SHORT TERM GOAL #2   Title Pt will increase # of reps of sit to stand to 10 reps in 30 seconds indicating an improvment in functional strength    Baseline 5 reps in 30 seconds on 12/31/2019, 10 reps in 30 seconds without use of hands    Status Achieved      PT SHORT TERM GOAL #3   Title Pt will decrease normal TUG score to < 14 seconds indicating an improvmemnt in functional mobility    Baseline  25.35 sec on 12/31/2019, 16.47 sec on 04/15/20, 05/21/2020 10.42 sec    Status Achieved             PT Long Term Goals - 06/16/20 1206      PT LONG TERM GOAL #1   Title Pt will report the pain in his legs is decreased to 4/10    Baseline 6/10 on eval, 5/10 at 04/15/2020, 05/21/2020 pain is 7/10,  10/4 2021, Pain at a level 5/10    Time 8    Period Weeks    Status On-going      PT LONG TERM GOAL #2   Title Pt will report his tiredness with walking is decreased by 50%    Baseline 05/21/2020.  Pt is improved by about 30%    Time 8    Period Weeks    Status On-going      PT LONG TERM GOAL #3   Title Pt will increase # of reps of sit to stand to 12 reps in 30 seconds indicating an improvment in functional strength    Baseline 5 reps in 30 secons on 12/31/2019, 8 reps on 04/15/2020, 10 reps on 05/21/2020    Time 8    Period Weeks    Status Revised      PT LONG TERM GOAL #4   Title Pt will decrease normal TUG score to < 9  seconds indicating an improvmemnt in functional mobility    Baseline 25.35 on 12/31/2019, 16.46. on 04/15/2020, 10 42 on 9/82021    Time 8    Period Weeks    Status Revised      PT LONG TERM GOAL #5   Title Pt will increae BERG balancer score to > 46 for moderate risk for fall    Baseline 01/07/20 score is 36 and < 36 is high risk for falls if walking without at device (  close to 100%), 04/15/2020 score is 41    Time 8    Period Weeks    Status On-going      PT LONG TERM GOAL #6   Title Pt will be able to do a floor to stand transfer with min assist    Time 8    Period Weeks    Status On-going                 Plan - 06/16/20 1214    Clinical Impression Statement Mr Benish continues to make improvments.  He is not walking without his cane in our clnic and move between exercise activities with  no incidences of balance loss.  He did well with times "tabata" type of exercise format and responded well to auditory cues from the app.  Upgraded to Nustep today and more  resistance to upper body with standing cable machine workout.  He still has episodes of fatigue and pain in his legs and would benefit from continued skilled PT Renewal sent today    Personal Factors and Comorbidities Age;Comorbidity 3+;Fitness;Past/Current Experience    Comorbidities a fib, recent sepsis, kidney injury and pneumonia, ongoing chemo, past back surgery    Examination-Activity Limitations Stand;Transfers;Stairs    Stability/Clinical Decision Making Evolving/Moderate complexity    Clinical Decision Making Moderate    PT Frequency 2x / week    PT Duration 8 weeks    PT Treatment/Interventions ADLs/Self Care Home Management;DME Instruction;Gait training;Stair training;Balance training;Therapeutic exercise;Therapeutic activities;Functional mobility training;Neuromuscular re-education;Cognitive remediation;Patient/family education;Manual lymph drainage;Orthotic Fit/Training;Manual techniques;Compression bandaging;Passive range of motion;Taping    PT Next Visit Plan continue strengthening and conditioning with interval style of workouts and resistance with machines and free weights  Work on floor to stand transfers  Monitor  feet. prn  Gait training  change direction, change speeds, direction, etc, stair training weaning use of assistive device    PT Home Exercise Plan supine scap exercise and standing squats, hip abduction and wall push ups    Consulted and Agree with Plan of Care Patient    Family Member Consulted wife           Patient will benefit from skilled therapeutic intervention in order to improve the following deficits and impairments:  Abnormal gait, Decreased balance, Decreased endurance, Decreased mobility, Difficulty walking, Impaired sensation, Impaired perceived functional ability, Increased edema, Decreased knowledge of precautions, Decreased activity tolerance, Decreased strength, Pain, Postural dysfunction  Visit Diagnosis: Difficulty in walking, not elsewhere  classified - Plan: PT plan of care cert/re-cert  Muscle weakness (generalized) - Plan: PT plan of care cert/re-cert  Abnormal posture - Plan: PT plan of care cert/re-cert  Repeated falls - Plan: PT plan of care cert/re-cert  Lymphedema, not elsewhere classified - Plan: PT plan of care cert/re-cert     Problem List Patient Active Problem List   Diagnosis Date Noted  . Edema 05/20/2020  . Closed fracture of left proximal humerus 01/15/2020  . Acute cystitis without hematuria 11/07/2019  . Left inguinal hernia 11/07/2019  . Physical deconditioning   . Depression with anxiety   . Acute respiratory failure with hypoxia ([place])   . Ischemic colitis ([place]) 09/18/2019  . Pressure injury of skin 09/16/2019  . Bloody diarrhea 09/14/2019  . Abdominal pain, diffuse 09/14/2019  . Acute metabolic encephalopathy 21/97/5883  . Goals of care, counseling/discussion 09/14/2019  . DNR (do not resuscitate) 09/14/2019  . Peripheral neuropathy 09/13/2019  . Gait disorder 09/13/2019  . Multiple myeloma not having achieved remission ([place]) 05/17/2019  .  Counseling regarding advance care planning and goals of care 05/17/2019  . Bone metastases ([place]) 05/17/2019  . Postoperative fever 05/09/2019  . S/P TAVR (transcatheter aortic valve replacement) 05/08/2019  . Multiple myeloma ([place]) 05/08/2019  . Acute on chronic diastolic heart failure ([place]) 05/08/2019  . Severe aortic stenosis   . CAD (coronary artery disease) 08/01/2018  . Hematochezia 07/27/2018  . HTN (hypertension) 02/28/2018  . Hypercholesteremia 02/28/2018  . Degeneration of lumbar intervertebral disc 12/06/2017  . Adenomatous polyp of ascending colon 12/02/2017  . Chronic low back pain 09/26/2017  . Mastoiditis 03/21/2017  . Abnormal TSH 12/23/2016  . PNA (pneumonia)   . AKI (acute kidney injury) ([place])   . Ascending aortic aneurysm ([place]) 02/15/2013  . Atrial fibrillation with RVR ([place]) 01/23/2013  . Well adult exam 05/31/2012  . PAF  (paroxysmal atrial fibrillation) ([place]) 08/05/2008  . HLD (hyperlipidemia) 04/08/2007  . Essential hypertension 04/08/2007   Donato Heinz. Owens Shark PT  Norwood Levo 06/16/2020, 12:23 PM  [place], [place], [address] Phone: [phone]   Fax:  [phone]  Name: Sharon Stapel MRN: [id number] Date of Birth: 1940/11/09

## 2020-06-16 NOTE — Telephone Encounter (Signed)
Patient's wife is calling and states the cardiology wanted Dr.Plot to look over the lab work they did and advised and he thinks anything needs to be done.   Please follow up with patient.

## 2020-06-16 NOTE — Telephone Encounter (Signed)
   Primary Cardiologist: [name], MD  Chart reviewed as part of pre-operative protocol coverage. Patient was contacted 06/16/2020 in reference to pre-operative risk assessment for pending surgery as outlined below.  [name] [name] was last seen on 05/07/20 by Angelena Form.  Since that day, [name] [name] has done well.  He has been active in PT at the [facility]. He is limited by neuropathy. He can complete 4.0 METS without angina. No angiographically significant CAD on heart cath last year prior to TAVR.   Per our clinical pharmacist: Patient with diagnosis of afib on Eliquis for anticoagulation.    Procedure: ENDOSCOPIC MUCOSAL RESECTION Date of procedure: 07/23/20  CHADS2-VASc score of  5 (CHF, HTN, AGE, CAD, AGE)  CrCl 53 ml/min  Per office protocol, patient can hold Eliquis for 2 days prior to procedure.   Therefore, based on ACC/AHA guidelines, the patient would be at acceptable risk for the planned procedure without further cardiovascular testing.   The patient was advised that if he develops new symptoms prior to surgery to contact our office to arrange for a follow-up visit, and he verbalized understanding.  I will route this recommendation to the requesting party via Epic fax function and remove from pre-op pool. Please call with questions.  Margaret, PA 06/16/2020, 12:57 PM

## 2020-06-18 ENCOUNTER — Ambulatory Visit: Payer: Medicare Other | Admitting: Physical Therapy

## 2020-06-18 ENCOUNTER — Encounter: Payer: Self-pay | Admitting: Physical Therapy

## 2020-06-18 ENCOUNTER — Other Ambulatory Visit: Payer: Self-pay

## 2020-06-18 DIAGNOSIS — R296 Repeated falls: Secondary | ICD-10-CM | POA: Diagnosis not present

## 2020-06-18 DIAGNOSIS — M6281 Muscle weakness (generalized): Secondary | ICD-10-CM | POA: Diagnosis not present

## 2020-06-18 DIAGNOSIS — I890 Lymphedema, not elsewhere classified: Secondary | ICD-10-CM

## 2020-06-18 DIAGNOSIS — R262 Difficulty in walking, not elsewhere classified: Secondary | ICD-10-CM | POA: Diagnosis not present

## 2020-06-18 DIAGNOSIS — R293 Abnormal posture: Secondary | ICD-10-CM

## 2020-06-18 NOTE — Therapy (Signed)
[place], [place], [address] Phone: [phone]   Fax:  [phone]  Physical Therapy Treatment  Patient Details  Name: [name] [name] MRN: [id number] Date of Birth: [birth year] Referring Provider (PT): Dr. Irene Limbo    Encounter Date: 06/18/2020   PT End of Session - 06/18/20 1914    Visit Number 15    Number of Visits 36    Date for PT Re-Evaluation 08/18/20    PT Start Time 1400    PT Stop Time 1445    PT Time Calculation (min) 45 min    Activity Tolerance Patient tolerated treatment well    Behavior During Therapy [facility] for tasks assessed/performed           Past Medical History:  Diagnosis Date  . Ascending aortic aneurysm ([place])   . Bicuspid aortic valve   . Cancer ([place])   . CHF NYHA class I (no symptoms from ordinary activities), acute, diastolic ([place])   . Dysrhythmia 2009   A fib  . Fatty liver    mild  . Fracture    left proximal humerus  . GERD (gastroesophageal reflux disease)   . GI bleeding 07/21/2018   post polypectomy  . Hemorrhoids   . HTN (hypertension)   . Hypercholesteremia   . Hypokalemia   . Internal hemorrhoids   . LBP (low back pain)   . Moderate aortic stenosis   . Osteoarthritis   . Paroxysmal atrial fibrillation ([place])    a. new onset Afib in 07/2008. He underwent ibutilide cardioversion successfully. b. Recurrence 01/2013 s/p TEE/DCCV - was on Xarelto but he stopped it as he was convinced it was causing joint pn. c. Recurrence 01/2016 - spont conv to NSR. Pt took Eliquis x1 mo then declined further anticoag. d. Recurrence 07/2016.  [name] Pneumonia   . Tubular adenoma of colon     Past Surgical History:  Procedure Laterality Date  . BACK SURGERY  x12 years ago  . CARDIAC CATHETERIZATION  2020  . CARDIAC VALVE REPLACEMENT  2020  . CARDIOVERSION N/A 01/26/2013   Procedure: CARDIOVERSION;  Surgeon: Larey Dresser, MD;  Location: [facility] ENDOSCOPY;  Service: Cardiovascular;   Laterality: N/A;  . CARDIOVERSION N/A 10/28/2017   Procedure: CARDIOVERSION;  Surgeon: Larey Dresser, MD;  Location: [facility] ENDOSCOPY;  Service: Cardiovascular;  Laterality: N/A;  . CARDIOVERSION N/A 03/03/2018   Procedure: CARDIOVERSION;  Surgeon: Lelon Perla, MD;  Location: [facility] ENDOSCOPY;  Service: Cardiovascular;  Laterality: N/A;  . CARDIOVERSION N/A 09/19/2019   Procedure: CARDIOVERSION;  Surgeon: Larey Dresser, MD;  Location: [facility] ENDOSCOPY;  Service: Cardiovascular;  Laterality: N/A;  . COLONOSCOPY    . COLONOSCOPY  07/17/2018   at [facility]  . HEMORRHOID SURGERY    . INGUINAL HERNIA REPAIR Left 03/18/2020   Procedure: OPEN LEFT INGUINAL HERNIA REPAIR;  Surgeon: Alphonsa Overall, MD;  Location: [place];  Service: General;  Laterality: Left;  . LUMBAR LAMINECTOMY    . ORIF HUMERUS FRACTURE Left 01/15/2020   Procedure: OPEN REDUCTION INTERNAL FIXATION (ORIF) PROXIMAL HUMERUS FRACTURE;  Surgeon: Nicholes Stairs, MD;  Location: [place];  Service: Orthopedics;  Laterality: Left;  . POLYPECTOMY    . RIGHT HEART CATH N/A 08/20/2019   Procedure: RIGHT HEART CATH;  Surgeon: Larey Dresser, MD;  Location: [place] CV LAB;  Service: Cardiovascular;  Laterality: N/A;  . RIGHT/LEFT HEART CATH AND CORONARY ANGIOGRAPHY N/A 03/07/2019   Procedure: RIGHT/LEFT HEART CATH AND CORONARY ANGIOGRAPHY;  Surgeon: Burnell Blanks, MD;  Location: [place] CV LAB;  Service: Cardiovascular;  Laterality: N/A;  . [name]  04/2019  . TEE WITHOUT CARDIOVERSION N/A 01/26/2013   Procedure: TRANSESOPHAGEAL ECHOCARDIOGRAM (TEE);  Surgeon: Larey Dresser, MD;  Location: [place];  Service: Cardiovascular;  Laterality: N/A;  . TEE WITHOUT CARDIOVERSION N/A 10/28/2017   Procedure: TRANSESOPHAGEAL ECHOCARDIOGRAM (TEE);  Surgeon: Larey Dresser, MD;  Location: [facility] ENDOSCOPY;  Service: Cardiovascular;  Laterality: N/A;  . TEE WITHOUT CARDIOVERSION N/A 05/08/2019   Procedure: TRANSESOPHAGEAL ECHOCARDIOGRAM (TEE);   Surgeon: Burnell Blanks, MD;  Location: [place] CV LAB;  Service: Open Heart Surgery;  Laterality: N/A;  . TEE WITHOUT CARDIOVERSION N/A 09/19/2019   Procedure: TRANSESOPHAGEAL ECHOCARDIOGRAM (TEE);  Surgeon: Larey Dresser, MD;  Location: [facility] ENDOSCOPY;  Service: Cardiovascular;  Laterality: N/A;  . TRANSCATHETER AORTIC VALVE REPLACEMENT, TRANSFEMORAL N/A 05/08/2019   Procedure: TRANSCATHETER AORTIC VALVE REPLACEMENT, TRANSFEMORAL;  Surgeon: Burnell Blanks, MD;  Location: [place] CV LAB;  Service: Open Heart Surgery;  Laterality: N/A;    There were no vitals filed for this visit.   Subjective Assessment - 06/18/20 1409    Subjective Pt states that he is doing well today.  He forgot to bring his cane today    Patient is accompained by: Family member    Pertinent History During preliminary work up for valve replacement in July of 2020 it was discovered that pt had large lytic lesions in his pelvis due to  multiple myeloma.  He had a TAVR in August 2021  and started chemotherapy for multiple myeloma which was successful in reducing the lestions. However,  he developed peripheral neuropathy and chemotherapy is being adjusted. He has multiple hospitalizations in dec-jan 2021 for septic colitis with acute kidney injury and pneumonia with associated deconditioning and difficutly walking due to Chemotherapy induced peripheral neuropathy.  Pt  had home health PT and continues to walk with a cane. Past history also significant for afib and back surgery.  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    Currently in Pain? Yes    Pain Score 5     Pain Location Foot    Pain Orientation Right;Left    Pain Descriptors / Indicators Sore    Pain Type Neuropathic pain    Pain Onset More than a month ago                             [facility] Adult PT Treatment/Exercise - 06/18/20 0001      High Level Balance   High Level Balance Comments pt stood in corner of room as  a precaution of balance and loss and did aobut 5 minutes of  yellow exercises ball catch and thrown, dribbling, volley back and forth with varying speed and directions for quick changes in balance did very well, but tended to keep both feet flat with some weight shift but pt not able to raise one leg up to fully weight shift onto other foot.       Exercises   Other Exercises  2 rounds of interval training on [employer] app  20 seconds exercise, 20 seconds rest for wall pushs, squats, 7# bicep curls, lunges, heel lifts with 6 #, scaption with 3#  Pt had almost 7 minutes of continues exercise and was able to transition to each activity according to times app better every session  Lumbar Exercises: Aerobic   [place] 2 rounds of one mintue at 1.75mph and one round of .9 mph       Knee/Hip Exercises: Standing   Other Standing Knee Exercises 5 rounds of toe forwad, middle, side  middle , back and return then other side, then 5 rounds of front side back.       Shoulder Exercises: Standing   Other Standing Exercises wall push ups x 20                     PT Short Term Goals - 06/16/20 1205      PT SHORT TERM GOAL #1   Title Pt and wife feel that they are able to manage pt feet and ankle swelling at home    Baseline wife states she feels the swelling is decreasing. They are using [place] and doing massage as needed    Status Achieved      PT SHORT TERM GOAL #2   Title Pt will increase # of reps of sit to stand to 10 reps in 30 seconds indicating an improvment in functional strength    Baseline 5 reps in 30 seconds on 12/31/2019, 10 reps in 30 seconds without use of hands    Status Achieved      PT SHORT TERM GOAL #3   Title Pt will decrease normal TUG score to < 14 seconds indicating an improvmemnt in functional mobility    Baseline 25.35 sec on 12/31/2019, 16.47 sec on 04/15/20, 05/21/2020 10.42 sec    Status Achieved             PT Long Term Goals - 06/16/20 1206      PT  LONG TERM GOAL #1   Title Pt will report the pain in his legs is decreased to 4/10    Baseline 6/10 on eval, 5/10 at 04/15/2020, 05/21/2020 pain is 7/10,  10/4 2021, Pain at a level 5/10    Time 8    Period Weeks    Status On-going      PT LONG TERM GOAL #2   Title Pt will report his tiredness with walking is decreased by 50%    Baseline 05/21/2020.  Pt is improved by about 30%    Time 8    Period Weeks    Status On-going      PT LONG TERM GOAL #3   Title Pt will increase # of reps of sit to stand to 12 reps in 30 seconds indicating an improvment in functional strength    Baseline 5 reps in 30 secons on 12/31/2019, 8 reps on 04/15/2020, 10 reps on 05/21/2020    Time 8    Period Weeks    Status Revised      PT LONG TERM GOAL #4   Title Pt will decrease normal TUG score to < 9  seconds indicating an improvmemnt in functional mobility    Baseline 25.35 on 12/31/2019, 16.46. on 04/15/2020, 10 42 on 9/82021    Time 8    Period Weeks    Status Revised      PT LONG TERM GOAL #5   Title Pt will increae BERG balancer score to > 46 for moderate risk for fall    Baseline 01/07/20 score is 36 and < 36 is high risk for falls if walking without at device ( close to 100%), 04/15/2020 score is 41    Time 8    Period Weeks    Status  On-going      PT LONG TERM GOAL #6   Title Pt will be able to do a floor to stand transfer with min assist    Time 8    Period Weeks    Status On-going                 Plan - 06/18/20 1915    Clinical Impression Statement Pt continues to respond well to exercise.  He was able to perfom full session of activity today with only one rest breast.  He also better to attend to directions and follow quick changes to exercise than he was previously.  He still needs cues for posture and to walk with longer steps and better heel stike and need work on full weight shift onto each leg in dynamic acitivities.  But overall, great progress!    Personal Factors and Comorbidities  Age;Comorbidity 3+;Fitness;Past/Current Experience    Comorbidities a fib, recent sepsis, kidney injury and pneumonia, ongoing chemo, past back surgery    Examination-Activity Limitations Stand;Transfers;Stairs    Stability/Clinical Decision Making Evolving/Moderate complexity    PT Frequency 2x / week    PT Duration 8 weeks    PT Treatment/Interventions ADLs/Self Care Home Management;DME Instruction;Gait training;Stair training;Balance training;Therapeutic exercise;Therapeutic activities;Functional mobility training;Neuromuscular re-education;Cognitive remediation;Patient/family education;Manual lymph drainage;Orthotic Fit/Training;Manual techniques;Compression bandaging;Passive range of motion;Taping    PT Next Visit Plan continue strengthening and conditioning with interval style of workouts and resistance with machines and free weights  continue wall push up challenge increasing by 5 reps each session til he gets to 50 Work on floor to stand transfers  Monitor  feet. prn  Gait training  change direction, change speeds, direction, etc, stair training weaning use of assistive device    PT Home Exercise Plan supine scap exercise and standing squats, hip abduction and wall push ups    Consulted and Agree with Plan of Care Patient    Family Member Consulted wife           Patient will benefit from skilled therapeutic intervention in order to improve the following deficits and impairments:  Abnormal gait, Decreased balance, Decreased endurance, Decreased mobility, Difficulty walking, Impaired sensation, Impaired perceived functional ability, Increased edema, Decreased knowledge of precautions, Decreased activity tolerance, Decreased strength, Pain, Postural dysfunction  Visit Diagnosis: Difficulty in walking, not elsewhere classified  Muscle weakness (generalized)  Abnormal posture  Repeated falls  Lymphedema, not elsewhere classified     Problem List Patient Active Problem List    Diagnosis Date Noted  . Edema 05/20/2020  . Closed fracture of left proximal humerus 01/15/2020  . Acute cystitis without hematuria 11/07/2019  . Left inguinal hernia 11/07/2019  . Physical deconditioning   . Depression with anxiety   . Acute respiratory failure with hypoxia ([place])   . Ischemic colitis ([place]) 09/18/2019  . Pressure injury of skin 09/16/2019  . Bloody diarrhea 09/14/2019  . Abdominal pain, diffuse 09/14/2019  . Acute metabolic encephalopathy 12/75/1700  . Goals of care, counseling/discussion 09/14/2019  . DNR (do not resuscitate) 09/14/2019  . Peripheral neuropathy 09/13/2019  . Gait disorder 09/13/2019  . Multiple myeloma not having achieved remission ([place]) 05/17/2019  . Counseling regarding advance care planning and goals of care 05/17/2019  . Bone metastases ([place]) 05/17/2019  . Postoperative fever 05/09/2019  . S/P TAVR (transcatheter aortic valve replacement) 05/08/2019  . Multiple myeloma ([place]) 05/08/2019  . Acute on chronic diastolic heart failure ([place]) 05/08/2019  . Severe aortic stenosis   . CAD (coronary  artery disease) 08/01/2018  . Hematochezia 07/27/2018  . HTN (hypertension) 02/28/2018  . Hypercholesteremia 02/28/2018  . Degeneration of lumbar intervertebral disc 12/06/2017  . Adenomatous polyp of ascending colon 12/02/2017  . Chronic low back pain 09/26/2017  . Mastoiditis 03/21/2017  . Abnormal TSH 12/23/2016  . PNA (pneumonia)   . AKI (acute kidney injury) ([place])   . Ascending aortic aneurysm ([place]) 02/15/2013  . Atrial fibrillation with RVR ([place]) 01/23/2013  . Well adult exam 05/31/2012  . PAF (paroxysmal atrial fibrillation) ([place]) 08/05/2008  . HLD (hyperlipidemia) 04/08/2007  . Essential hypertension 04/08/2007   Donato Heinz. Owens Shark PT  Norwood Levo 06/18/2020, 7:20 PM  [place], [place], [address] Phone: [phone]   Fax:  [phone]  Name: [name] [name] MRN: [id number] Date of Birth: September 01, 1941

## 2020-06-19 NOTE — Telephone Encounter (Signed)
Called pt/wife no answer LMOM RTC.../lmb 

## 2020-06-19 NOTE — Telephone Encounter (Signed)
Called wife gave her MD response. She states Dr. Aundra Dubin had ordered his TSH labs and they will go there to have done.Marland KitchenJohny Chess

## 2020-06-19 NOTE — Telephone Encounter (Signed)
Recent labs are okay.  She was supposed to repeat thyroid tests for me.  It hasn't been done yet.  Thanks

## 2020-06-19 NOTE — Telephone Encounter (Signed)
Pts wife Zora returned you call. Please give her a call back at [phone]. Thanks.

## 2020-06-23 ENCOUNTER — Other Ambulatory Visit: Payer: Self-pay

## 2020-06-23 ENCOUNTER — Ambulatory Visit (HOSPITAL_COMMUNITY)
Admission: RE | Admit: 2020-06-23 | Discharge: 2020-06-23 | Disposition: A | Discharge status: Home / Self Care | Payer: Medicare Other | Source: Ambulatory Visit | Attending: Internal Medicine | Admitting: Internal Medicine

## 2020-06-23 ENCOUNTER — Ambulatory Visit: Payer: Medicare Other | Admitting: Physical Therapy

## 2020-06-23 ENCOUNTER — Encounter: Payer: Self-pay | Admitting: Physical Therapy

## 2020-06-23 DIAGNOSIS — R7989 Other specified abnormal findings of blood chemistry: Secondary | ICD-10-CM | POA: Insufficient documentation

## 2020-06-23 DIAGNOSIS — I890 Lymphedema, not elsewhere classified: Secondary | ICD-10-CM

## 2020-06-23 DIAGNOSIS — M6281 Muscle weakness (generalized): Secondary | ICD-10-CM

## 2020-06-23 DIAGNOSIS — R262 Difficulty in walking, not elsewhere classified: Secondary | ICD-10-CM | POA: Diagnosis not present

## 2020-06-23 DIAGNOSIS — R293 Abnormal posture: Secondary | ICD-10-CM | POA: Diagnosis not present

## 2020-06-23 DIAGNOSIS — I5032 Chronic diastolic (congestive) heart failure: Secondary | ICD-10-CM

## 2020-06-23 DIAGNOSIS — R296 Repeated falls: Secondary | ICD-10-CM

## 2020-06-23 DIAGNOSIS — I38 Endocarditis, valve unspecified: Secondary | ICD-10-CM | POA: Insufficient documentation

## 2020-06-23 LAB — HEPATIC FUNCTION PANEL
ALT: 39 U/L (ref 0–44)
AST: 29 U/L (ref 15–41)
Albumin: 4.2 g/dL (ref 3.5–5.0)
Alkaline Phosphatase: 54 U/L (ref 38–126)
Bilirubin, Direct: 0.1 mg/dL (ref 0.0–0.2)
Indirect Bilirubin: 0.8 mg/dL (ref 0.3–0.9)
Total Bilirubin: 0.9 mg/dL (ref 0.3–1.2)
Total Protein: 5.7 g/dL — ABNORMAL LOW (ref 6.5–8.1)

## 2020-06-23 LAB — T4, FREE: Free T4: 0.79 ng/dL (ref 0.61–1.12)

## 2020-06-23 LAB — TSH: TSH: 5.338 u[IU]/mL — ABNORMAL HIGH (ref 0.350–4.500)

## 2020-06-23 NOTE — Therapy (Signed)
[place], [place], [address] Phone: [phone]   Fax:  [phone]  Physical Therapy Treatment  Patient Details  Name: [name] [name] MRN: [id number] Date of Birth: 30-Sep-1940 Referring Provider (PT): Dr. Irene Limbo    Encounter Date: 06/23/2020   PT End of Session - 06/23/20 1314    Visit Number 16    Number of Visits 36    Date for PT Re-Evaluation 08/18/20    PT Start Time 1000    PT Stop Time 1050    PT Time Calculation (min) 50 min    Activity Tolerance Patient tolerated treatment well    Behavior During Therapy [facility] for tasks assessed/performed           Past Medical History:  Diagnosis Date  . Ascending aortic aneurysm ([place])   . Bicuspid aortic valve   . Cancer ([place])   . CHF NYHA class I (no symptoms from ordinary activities), acute, diastolic ([place])   . Dysrhythmia 2009   A fib  . Fatty liver    mild  . Fracture    left proximal humerus  . GERD (gastroesophageal reflux disease)   . GI bleeding 07/21/2018   post polypectomy  . Hemorrhoids   . HTN (hypertension)   . Hypercholesteremia   . Hypokalemia   . Internal hemorrhoids   . LBP (low back pain)   . Moderate aortic stenosis   . Osteoarthritis   . Paroxysmal atrial fibrillation ([place])    a. new onset Afib in 07/2008. He underwent ibutilide cardioversion successfully. b. Recurrence 01/2013 s/p TEE/DCCV - was on Xarelto but he stopped it as he was convinced it was causing joint pn. c. Recurrence 01/2016 - spont conv to NSR. Pt took Eliquis x1 mo then declined further anticoag. d. Recurrence 07/2016.  [name] Pneumonia   . Tubular adenoma of colon     Past Surgical History:  Procedure Laterality Date  . BACK SURGERY  x12 years ago  . CARDIAC CATHETERIZATION  2020  . CARDIAC VALVE REPLACEMENT  2020  . CARDIOVERSION N/A 01/26/2013   Procedure: CARDIOVERSION;  Surgeon: Larey Dresser, MD;  Location: [facility] ENDOSCOPY;  Service: Cardiovascular;   Laterality: N/A;  . CARDIOVERSION N/A 10/28/2017   Procedure: CARDIOVERSION;  Surgeon: Larey Dresser, MD;  Location: [facility] ENDOSCOPY;  Service: Cardiovascular;  Laterality: N/A;  . CARDIOVERSION N/A 03/03/2018   Procedure: CARDIOVERSION;  Surgeon: Lelon Perla, MD;  Location: [facility] ENDOSCOPY;  Service: Cardiovascular;  Laterality: N/A;  . CARDIOVERSION N/A 09/19/2019   Procedure: CARDIOVERSION;  Surgeon: Larey Dresser, MD;  Location: [facility] ENDOSCOPY;  Service: Cardiovascular;  Laterality: N/A;  . COLONOSCOPY    . COLONOSCOPY  07/17/2018   at [facility]  . HEMORRHOID SURGERY    . INGUINAL HERNIA REPAIR Left 03/18/2020   Procedure: OPEN LEFT INGUINAL HERNIA REPAIR;  Surgeon: Alphonsa Overall, MD;  Location: [place];  Service: General;  Laterality: Left;  . LUMBAR LAMINECTOMY    . ORIF HUMERUS FRACTURE Left 01/15/2020   Procedure: OPEN REDUCTION INTERNAL FIXATION (ORIF) PROXIMAL HUMERUS FRACTURE;  Surgeon: Nicholes Stairs, MD;  Location: [place];  Service: Orthopedics;  Laterality: Left;  . POLYPECTOMY    . RIGHT HEART CATH N/A 08/20/2019   Procedure: RIGHT HEART CATH;  Surgeon: Larey Dresser, MD;  Location: [place] CV LAB;  Service: Cardiovascular;  Laterality: N/A;  . RIGHT/LEFT HEART CATH AND CORONARY ANGIOGRAPHY N/A 03/07/2019   Procedure: RIGHT/LEFT HEART CATH AND CORONARY ANGIOGRAPHY;  Surgeon: Burnell Blanks, MD;  Location: [place] CV LAB;  Service: Cardiovascular;  Laterality: N/A;  . [name]  04/2019  . TEE WITHOUT CARDIOVERSION N/A 01/26/2013   Procedure: TRANSESOPHAGEAL ECHOCARDIOGRAM (TEE);  Surgeon: Larey Dresser, MD;  Location: [place];  Service: Cardiovascular;  Laterality: N/A;  . TEE WITHOUT CARDIOVERSION N/A 10/28/2017   Procedure: TRANSESOPHAGEAL ECHOCARDIOGRAM (TEE);  Surgeon: Larey Dresser, MD;  Location: [facility] ENDOSCOPY;  Service: Cardiovascular;  Laterality: N/A;  . TEE WITHOUT CARDIOVERSION N/A 05/08/2019   Procedure: TRANSESOPHAGEAL ECHOCARDIOGRAM (TEE);   Surgeon: Burnell Blanks, MD;  Location: [place] CV LAB;  Service: Open Heart Surgery;  Laterality: N/A;  . TEE WITHOUT CARDIOVERSION N/A 09/19/2019   Procedure: TRANSESOPHAGEAL ECHOCARDIOGRAM (TEE);  Surgeon: Larey Dresser, MD;  Location: [facility] ENDOSCOPY;  Service: Cardiovascular;  Laterality: N/A;  . TRANSCATHETER AORTIC VALVE REPLACEMENT, TRANSFEMORAL N/A 05/08/2019   Procedure: TRANSCATHETER AORTIC VALVE REPLACEMENT, TRANSFEMORAL;  Surgeon: Burnell Blanks, MD;  Location: [place] CV LAB;  Service: Open Heart Surgery;  Laterality: N/A;    There were no vitals filed for this visit.   Subjective Assessment - 06/23/20 1009    Subjective Pt says he is not doing well today. He has tried to do exercise the past few weeks, but feels that the pain in his leg is even more frequent that it was.  He said that he really needed to use his cane today .  He has pitting edema in his right lower leg    Patient is accompained by: Family member    Pertinent History During preliminary work up for valve replacement in July of 2020 it was discovered that pt had large lytic lesions in his pelvis due to  multiple myeloma.  He had a TAVR in August 2021  and started chemotherapy for multiple myeloma which was successful in reducing the lestions. However,  he developed peripheral neuropathy and chemotherapy is being adjusted. He has multiple hospitalizations in dec-jan 2021 for septic colitis with acute kidney injury and pneumonia with associated deconditioning and difficutly walking due to Chemotherapy induced peripheral neuropathy.  Pt  had home health PT and continues to walk with a cane. Past history also significant for afib and back surgery.  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    Patient Stated Goals To get relief with pain in feet and decrease swelling, get more stable  and walk without the came    Currently in Pain? Yes    Pain Score 8     Pain Location Foot    Pain Orientation  Right;Left    Pain Radiating Towards up to the legs now, sometimes even to knees    Pain Onset More than a month ago    Pain Frequency Constant    Aggravating Factors  walking makes his pain worse                             [facility] Adult PT Treatment/Exercise - 06/23/20 0001      Exercises   Exercises Lumbar    Other Exercises  Pt not able to do his [place] workout today but was able to do 5 minutes of Zoom ball ( pass ball ) with feet in different staggered stand positions with core activiated for no balance loss with good speed of resisted horizontal abduction needed to pass the ball       Manual Therapy  Manual Therapy Edema management;Manual Lymphatic Drainage (MLD)    Edema Management both feet elevated on bolster so they are above the heart and pt doing ankle pumps intermittently     Manual Lymphatic Drainage (MLD) to both lower legs and feet                     PT Short Term Goals - 06/16/20 1205      PT SHORT TERM GOAL #1   Title Pt and wife feel that they are able to manage pt feet and ankle swelling at home    Baseline wife states she feels the swelling is decreasing. They are using [place] and doing massage as needed    Status Achieved      PT SHORT TERM GOAL #2   Title Pt will increase # of reps of sit to stand to 10 reps in 30 seconds indicating an improvment in functional strength    Baseline 5 reps in 30 seconds on 12/31/2019, 10 reps in 30 seconds without use of hands    Status Achieved      PT SHORT TERM GOAL #3   Title Pt will decrease normal TUG score to < 14 seconds indicating an improvmemnt in functional mobility    Baseline 25.35 sec on 12/31/2019, 16.47 sec on 04/15/20, 05/21/2020 10.42 sec    Status Achieved             PT Long Term Goals - 06/16/20 1206      PT LONG TERM GOAL #1   Title Pt will report the pain in his legs is decreased to 4/10    Baseline 6/10 on eval, 5/10 at 04/15/2020, 05/21/2020 pain is 7/10,  10/4 2021,  Pain at a level 5/10    Time 8    Period Weeks    Status On-going      PT LONG TERM GOAL #2   Title Pt will report his tiredness with walking is decreased by 50%    Baseline 05/21/2020.  Pt is improved by about 30%    Time 8    Period Weeks    Status On-going      PT LONG TERM GOAL #3   Title Pt will increase # of reps of sit to stand to 12 reps in 30 seconds indicating an improvment in functional strength    Baseline 5 reps in 30 secons on 12/31/2019, 8 reps on 04/15/2020, 10 reps on 05/21/2020    Time 8    Period Weeks    Status Revised      PT LONG TERM GOAL #4   Title Pt will decrease normal TUG score to < 9  seconds indicating an improvmemnt in functional mobility    Baseline 25.35 on 12/31/2019, 16.46. on 04/15/2020, 10 42 on 9/82021    Time 8    Period Weeks    Status Revised      PT LONG TERM GOAL #5   Title Pt will increae BERG balancer score to > 46 for moderate risk for fall    Baseline 01/07/20 score is 36 and < 36 is high risk for falls if walking without at device ( close to 100%), 04/15/2020 score is 41    Time 8    Period Weeks    Status On-going      PT LONG TERM GOAL #6   Title Pt will be able to do a floor to stand transfer with min assist    Time 8  Period Weeks    Status On-going                 Plan - 06/23/20 1314    Clinical Impression Statement Pt discouraged today be recurring peristent pain in his lower legs. Treatment adjusted today so that he still could participate in active balance and LE exercise He received symptomatic relief from manual work and said he felt better after treatment session    Personal Factors and Comorbidities Age;Comorbidity 3+;Fitness;Past/Current Experience    Comorbidities a fib, recent sepsis, kidney injury and pneumonia, ongoing chemo, past back surgery    Stability/Clinical Decision Making Evolving/Moderate complexity    Rehab Potential Good    PT Frequency 2x / week    PT Duration 8 weeks    PT  Treatment/Interventions ADLs/Self Care Home Management;DME Instruction;Gait training;Stair training;Balance training;Therapeutic exercise;Therapeutic activities;Functional mobility training;Neuromuscular re-education;Cognitive remediation;Patient/family education;Manual lymph drainage;Orthotic Fit/Training;Manual techniques;Compression bandaging;Passive range of motion;Taping    PT Next Visit Plan continue strengthening and conditioning with interval style of workouts and resistance with machines and free weights  continue wall push up challenge increasing by 5 reps each session til he gets to 50 Work on floor to stand transfers  Monitor  feet. prn  Gait training  change direction, change speeds, direction, etc, stair training weaning use of assistive device    PT Home Exercise Plan supine scap exercise and standing squats, hip abduction and wall push ups    Consulted and Agree with Plan of Care Patient    Family Member Consulted wife           Patient will benefit from skilled therapeutic intervention in order to improve the following deficits and impairments:  Abnormal gait, Decreased balance, Decreased endurance, Decreased mobility, Difficulty walking, Impaired sensation, Impaired perceived functional ability, Increased edema, Decreased knowledge of precautions, Decreased activity tolerance, Decreased strength, Pain, Postural dysfunction  Visit Diagnosis: Difficulty in walking, not elsewhere classified  Muscle weakness (generalized)  Abnormal posture  Repeated falls  Lymphedema, not elsewhere classified     Problem List Patient Active Problem List   Diagnosis Date Noted  . Edema 05/20/2020  . Closed fracture of left proximal humerus 01/15/2020  . Acute cystitis without hematuria 11/07/2019  . Left inguinal hernia 11/07/2019  . Physical deconditioning   . Depression with anxiety   . Acute respiratory failure with hypoxia ([place])   . Ischemic colitis ([place]) 09/18/2019  . Pressure  injury of skin 09/16/2019  . Bloody diarrhea 09/14/2019  . Abdominal pain, diffuse 09/14/2019  . Acute metabolic encephalopathy 09/32/3557  . Goals of care, counseling/discussion 09/14/2019  . DNR (do not resuscitate) 09/14/2019  . Peripheral neuropathy 09/13/2019  . Gait disorder 09/13/2019  . Multiple myeloma not having achieved remission ([place]) 05/17/2019  . Counseling regarding advance care planning and goals of care 05/17/2019  . Bone metastases ([place]) 05/17/2019  . Postoperative fever 05/09/2019  . S/P TAVR (transcatheter aortic valve replacement) 05/08/2019  . Multiple myeloma ([place]) 05/08/2019  . Acute on chronic diastolic heart failure ([place]) 05/08/2019  . Severe aortic stenosis   . CAD (coronary artery disease) 08/01/2018  . Hematochezia 07/27/2018  . HTN (hypertension) 02/28/2018  . Hypercholesteremia 02/28/2018  . Degeneration of lumbar intervertebral disc 12/06/2017  . Adenomatous polyp of ascending colon 12/02/2017  . Chronic low back pain 09/26/2017  . Mastoiditis 03/21/2017  . Abnormal TSH 12/23/2016  . PNA (pneumonia)   . AKI (acute kidney injury) ([place])   . Ascending aortic aneurysm ([place]) 02/15/2013  . Atrial  fibrillation with RVR ([place]) 01/23/2013  . Well adult exam 05/31/2012  . PAF (paroxysmal atrial fibrillation) ([place]) 08/05/2008  . HLD (hyperlipidemia) 04/08/2007  . Essential hypertension 04/08/2007   Donato Heinz. Owens Shark PT  Norwood Levo 06/23/2020, 1:18 PM  [place], [place], [address] Phone: [phone]   Fax:  [phone]  Name: [name] [name] MRN: [id number] Date of Birth: 08/27/1941

## 2020-06-24 LAB — T3, FREE: T3, Free: 2.2 pg/mL (ref 2.0–4.4)

## 2020-06-25 ENCOUNTER — Other Ambulatory Visit: Payer: Self-pay

## 2020-06-25 ENCOUNTER — Ambulatory Visit: Payer: Medicare Other | Admitting: Physical Therapy

## 2020-06-25 DIAGNOSIS — R296 Repeated falls: Secondary | ICD-10-CM

## 2020-06-25 DIAGNOSIS — I890 Lymphedema, not elsewhere classified: Secondary | ICD-10-CM

## 2020-06-25 DIAGNOSIS — R293 Abnormal posture: Secondary | ICD-10-CM

## 2020-06-25 DIAGNOSIS — M6281 Muscle weakness (generalized): Secondary | ICD-10-CM

## 2020-06-25 DIAGNOSIS — R262 Difficulty in walking, not elsewhere classified: Secondary | ICD-10-CM

## 2020-06-25 NOTE — Therapy (Signed)
[place], [place], [address] Phone: [phone]   Fax:  [phone]  Physical Therapy Treatment  Patient Details  Name: [name] [name] MRN: [id number] Date of Birth: Nov 08, 1940 Referring Provider (PT): Dr. Irene Limbo    Encounter Date: 06/25/2020   PT End of Session - 06/25/20 1256    Visit Number 17    Number of Visits 36    Date for PT Re-Evaluation 08/18/20    PT Start Time 1203    PT Stop Time 1250    PT Time Calculation (min) 47 min    Activity Tolerance Patient tolerated treatment well           Past Medical History:  Diagnosis Date  . Ascending aortic aneurysm ([place])   . Bicuspid aortic valve   . Cancer ([place])   . CHF NYHA class I (no symptoms from ordinary activities), acute, diastolic ([place])   . Dysrhythmia 2009   A fib  . Fatty liver    mild  . Fracture    left proximal humerus  . GERD (gastroesophageal reflux disease)   . GI bleeding 07/21/2018   post polypectomy  . Hemorrhoids   . HTN (hypertension)   . Hypercholesteremia   . Hypokalemia   . Internal hemorrhoids   . LBP (low back pain)   . Moderate aortic stenosis   . Osteoarthritis   . Paroxysmal atrial fibrillation ([place])    a. new onset Afib in 07/2008. He underwent ibutilide cardioversion successfully. b. Recurrence 01/2013 s/p TEE/DCCV - was on Xarelto but he stopped it as he was convinced it was causing joint pn. c. Recurrence 01/2016 - spont conv to NSR. Pt took Eliquis x1 mo then declined further anticoag. d. Recurrence 07/2016.  [name] Pneumonia   . Tubular adenoma of colon     Past Surgical History:  Procedure Laterality Date  . BACK SURGERY  x12 years ago  . CARDIAC CATHETERIZATION  2020  . CARDIAC VALVE REPLACEMENT  2020  . CARDIOVERSION N/A 01/26/2013   Procedure: CARDIOVERSION;  Surgeon: Larey Dresser, MD;  Location: [facility] ENDOSCOPY;  Service: Cardiovascular;  Laterality: N/A;  . CARDIOVERSION N/A 10/28/2017    Procedure: CARDIOVERSION;  Surgeon: Larey Dresser, MD;  Location: [facility] ENDOSCOPY;  Service: Cardiovascular;  Laterality: N/A;  . CARDIOVERSION N/A 03/03/2018   Procedure: CARDIOVERSION;  Surgeon: Lelon Perla, MD;  Location: [facility] ENDOSCOPY;  Service: Cardiovascular;  Laterality: N/A;  . CARDIOVERSION N/A 09/19/2019   Procedure: CARDIOVERSION;  Surgeon: Larey Dresser, MD;  Location: [facility] ENDOSCOPY;  Service: Cardiovascular;  Laterality: N/A;  . COLONOSCOPY    . COLONOSCOPY  07/17/2018   at [facility]  . HEMORRHOID SURGERY    . INGUINAL HERNIA REPAIR Left 03/18/2020   Procedure: OPEN LEFT INGUINAL HERNIA REPAIR;  Surgeon: Alphonsa Overall, MD;  Location: [place];  Service: General;  Laterality: Left;  . LUMBAR LAMINECTOMY    . ORIF HUMERUS FRACTURE Left 01/15/2020   Procedure: OPEN REDUCTION INTERNAL FIXATION (ORIF) PROXIMAL HUMERUS FRACTURE;  Surgeon: Nicholes Stairs, MD;  Location: [place];  Service: Orthopedics;  Laterality: Left;  . POLYPECTOMY    . RIGHT HEART CATH N/A 08/20/2019   Procedure: RIGHT HEART CATH;  Surgeon: Larey Dresser, MD;  Location: [place] CV LAB;  Service: Cardiovascular;  Laterality: N/A;  . RIGHT/LEFT HEART CATH AND CORONARY ANGIOGRAPHY N/A 03/07/2019   Procedure: RIGHT/LEFT HEART CATH AND CORONARY ANGIOGRAPHY;  Surgeon: Burnell Blanks, MD;  Location: [place] CV  LAB;  Service: Cardiovascular;  Laterality: N/A;  . [name]  04/2019  . TEE WITHOUT CARDIOVERSION N/A 01/26/2013   Procedure: TRANSESOPHAGEAL ECHOCARDIOGRAM (TEE);  Surgeon: Larey Dresser, MD;  Location: [place];  Service: Cardiovascular;  Laterality: N/A;  . TEE WITHOUT CARDIOVERSION N/A 10/28/2017   Procedure: TRANSESOPHAGEAL ECHOCARDIOGRAM (TEE);  Surgeon: Larey Dresser, MD;  Location: [facility] ENDOSCOPY;  Service: Cardiovascular;  Laterality: N/A;  . TEE WITHOUT CARDIOVERSION N/A 05/08/2019   Procedure: TRANSESOPHAGEAL ECHOCARDIOGRAM (TEE);  Surgeon: Burnell Blanks, MD;  Location: [place] CV LAB;  Service: Open Heart Surgery;  Laterality: N/A;  . TEE WITHOUT CARDIOVERSION N/A 09/19/2019   Procedure: TRANSESOPHAGEAL ECHOCARDIOGRAM (TEE);  Surgeon: Larey Dresser, MD;  Location: [facility] ENDOSCOPY;  Service: Cardiovascular;  Laterality: N/A;  . TRANSCATHETER AORTIC VALVE REPLACEMENT, TRANSFEMORAL N/A 05/08/2019   Procedure: TRANSCATHETER AORTIC VALVE REPLACEMENT, TRANSFEMORAL;  Surgeon: Burnell Blanks, MD;  Location: [place] CV LAB;  Service: Open Heart Surgery;  Laterality: N/A;    There were no vitals filed for this visit.   Subjective Assessment - 06/25/20 1206    Subjective Pt says he is doing good today ,  He was able to cut his grass yesterday    Patient is accompained by: Family member    Pertinent History During preliminary work up for valve replacement in July of 2020 it was discovered that pt had large lytic lesions in his pelvis due to  multiple myeloma.  He had a TAVR in August 2021  and started chemotherapy for multiple myeloma which was successful in reducing the lestions. However,  he developed peripheral neuropathy and chemotherapy is being adjusted. He has multiple hospitalizations in dec-jan 2021 for septic colitis with acute kidney injury and pneumonia with associated deconditioning and difficutly walking due to Chemotherapy induced peripheral neuropathy.  Pt  had home health PT and continues to walk with a cane. Past history also significant for afib and back surgery.  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    Patient Stated Goals To get relief with pain in feet and decrease swelling, get more stable  and walk without the came    Currently in Pain? --   did not ask patient today as we are focusing on positives                            [facility] Adult PT Treatment/Exercise - 06/25/20 0001      Transfers   Transfers Floor to Transfer    Floor to Transfer 4: Min assist    Floor to Transfer Details (indicate cue type and  reason) after demonstration, pt able to come from mat to knees on airex mat to pad knees in front of chair with armrests, then, holding onto armrest, bring one leg up and then the other to pivot hips back to mat      Ambulation/Gait   Ambulation/Gait Yes    Ambulation/Gait Assistance 5: Supervision    Ambulation/Gait Assistance Details cues to increase stride length and get better heel strike, swing arms and bring chest up       Exercises   Other Exercises  10 cycles of 20 sec on 10 sec rest of Tabata exercise: wall push ups, Rt. and Lf lunges, bicep curls with 7#  wall squats, wall sit, scaption with 3# in each hand, Rt. and Lt standing hip abduction f      Lumbar Exercises: Aerobic  UBE (Upper Arm Bike) 4 minutes at level 1, self selected speed                     PT Short Term Goals - 06/16/20 1205      PT SHORT TERM GOAL #1   Title Pt and wife feel that they are able to manage pt feet and ankle swelling at home    Baseline wife states she feels the swelling is decreasing. They are using [place] and doing massage as needed    Status Achieved      PT SHORT TERM GOAL #2   Title Pt will increase # of reps of sit to stand to 10 reps in 30 seconds indicating an improvment in functional strength    Baseline 5 reps in 30 seconds on 12/31/2019, 10 reps in 30 seconds without use of hands    Status Achieved      PT SHORT TERM GOAL #3   Title Pt will decrease normal TUG score to < 14 seconds indicating an improvmemnt in functional mobility    Baseline 25.35 sec on 12/31/2019, 16.47 sec on 04/15/20, 05/21/2020 10.42 sec    Status Achieved             PT Long Term Goals - 06/16/20 1206      PT LONG TERM GOAL #1   Title Pt will report the pain in his legs is decreased to 4/10    Baseline 6/10 on eval, 5/10 at 04/15/2020, 05/21/2020 pain is 7/10,  10/4 2021, Pain at a level 5/10    Time 8    Period Weeks    Status On-going      PT LONG TERM GOAL #2   Title Pt will report his  tiredness with walking is decreased by 50%    Baseline 05/21/2020.  Pt is improved by about 30%    Time 8    Period Weeks    Status On-going      PT LONG TERM GOAL #3   Title Pt will increase # of reps of sit to stand to 12 reps in 30 seconds indicating an improvment in functional strength    Baseline 5 reps in 30 secons on 12/31/2019, 8 reps on 04/15/2020, 10 reps on 05/21/2020    Time 8    Period Weeks    Status Revised      PT LONG TERM GOAL #4   Title Pt will decrease normal TUG score to < 9  seconds indicating an improvmemnt in functional mobility    Baseline 25.35 on 12/31/2019, 16.46. on 04/15/2020, 10 42 on [id number]/82021    Time 8    Period Weeks    Status Revised      PT LONG TERM GOAL #5   Title Pt will increae BERG balancer score to > 46 for moderate risk for fall    Baseline 01/07/20 score is 36 and < 36 is high risk for falls if walking without at device ( close to 100%), 04/15/2020 score is 41    Time 8    Period Weeks    Status On-going      PT LONG TERM GOAL #6   Title Pt will be able to do a floor to stand transfer with min assist    Time 8    Period Weeks    Status On-going                 Plan - 06/25/20 1256  Clinical Impression Statement Pt was able to complete 7 and 1/2 minutes of continued exercise with Tabata app changing activities within 10 seconds.  He added UBE exercise and was able to get to knees on ariex on floor inpreparation for floor to chair transfers. At end of session, pt appeared fatigue and had O2 sat of 93 and HR of 68    Personal Factors and Comorbidities Age;Comorbidity 3+;Fitness;Past/Current Experience    Comorbidities a fib, recent sepsis, kidney injury and pneumonia, ongoing chemo, past back surgery    Examination-Activity Limitations Stand;Transfers;Stairs    Stability/Clinical Decision Making Evolving/Moderate complexity    Rehab Potential Good    PT Frequency 2x / week    PT Duration 8 weeks    PT Treatment/Interventions ADLs/Self  Care Home Management;DME Instruction;Gait training;Stair training;Balance training;Therapeutic exercise;Therapeutic activities;Functional mobility training;Neuromuscular re-education;Cognitive remediation;Patient/family education;Manual lymph drainage;Orthotic Fit/Training;Manual techniques;Compression bandaging;Passive range of motion;Taping    PT Next Visit Plan continue strengthening and conditioning with interval style of workouts and resistance with machines and free weights  continue wall push up challenge increasing by 5 reps each session til he gets to 50 Work on floor to stand transfers  Monitor  feet. prn  Gait training  change direction, change speeds, direction, etc, stair training weaning use of assistive device           Patient will benefit from skilled therapeutic intervention in order to improve the following deficits and impairments:  Abnormal gait, Decreased balance, Decreased endurance, Decreased mobility, Difficulty walking, Impaired sensation, Impaired perceived functional ability, Increased edema, Decreased knowledge of precautions, Decreased activity tolerance, Decreased strength, Pain, Postural dysfunction  Visit Diagnosis: Muscle weakness (generalized)  Difficulty in walking, not elsewhere classified  Abnormal posture  Repeated falls  Lymphedema, not elsewhere classified     Problem List Patient Active Problem List   Diagnosis Date Noted  . Edema 05/20/2020  . Closed fracture of left proximal humerus 01/15/2020  . Acute cystitis without hematuria 11/07/2019  . Left inguinal hernia 11/07/2019  . Physical deconditioning   . Depression with anxiety   . Acute respiratory failure with hypoxia ([place])   . Ischemic colitis ([place]) 09/18/2019  . Pressure injury of skin 09/16/2019  . Bloody diarrhea 09/14/2019  . Abdominal pain, diffuse 09/14/2019  . Acute metabolic encephalopathy 84/69/6295  . Goals of care, counseling/discussion 09/14/2019  . DNR (do not  resuscitate) 09/14/2019  . Peripheral neuropathy 09/13/2019  . Gait disorder 09/13/2019  . Multiple myeloma not having achieved remission ([place]) 05/17/2019  . Counseling regarding advance care planning and goals of care 05/17/2019  . Bone metastases ([place]) 05/17/2019  . Postoperative fever 05/09/2019  . S/P TAVR (transcatheter aortic valve replacement) 05/08/2019  . Multiple myeloma ([place]) 05/08/2019  . Acute on chronic diastolic heart failure ([place]) 05/08/2019  . Severe aortic stenosis   . CAD (coronary artery disease) 08/01/2018  . Hematochezia 07/27/2018  . HTN (hypertension) 02/28/2018  . Hypercholesteremia 02/28/2018  . Degeneration of lumbar intervertebral disc 12/06/2017  . Adenomatous polyp of ascending colon 12/02/2017  . Chronic low back pain 09/26/2017  . Mastoiditis 03/21/2017  . Abnormal TSH 12/23/2016  . PNA (pneumonia)   . AKI (acute kidney injury) ([place])   . Ascending aortic aneurysm ([place]) 02/15/2013  . Atrial fibrillation with RVR ([place]) 01/23/2013  . Well adult exam 05/31/2012  . PAF (paroxysmal atrial fibrillation) ([place]) 08/05/2008  . HLD (hyperlipidemia) 04/08/2007  . Essential hypertension 04/08/2007   Donato Heinz. Owens Shark, PT  [place] Levo 06/25/2020, 1:01 PM  [place], [place], [address] Phone: [phone]   Fax:  [phone]  Name: [name] [name] MRN: [id number] Date of Birth: [birth year]

## 2020-06-30 ENCOUNTER — Other Ambulatory Visit: Payer: Self-pay | Admitting: Hematology

## 2020-06-30 ENCOUNTER — Ambulatory Visit: Payer: Medicare Other | Admitting: Physical Therapy

## 2020-06-30 NOTE — Telephone Encounter (Signed)
Please review for refill.  

## 2020-07-02 ENCOUNTER — Other Ambulatory Visit: Payer: Self-pay

## 2020-07-02 ENCOUNTER — Ambulatory Visit: Payer: Medicare Other | Admitting: Physical Therapy

## 2020-07-02 ENCOUNTER — Encounter: Payer: Self-pay | Admitting: Physical Therapy

## 2020-07-02 DIAGNOSIS — I890 Lymphedema, not elsewhere classified: Secondary | ICD-10-CM

## 2020-07-02 DIAGNOSIS — R293 Abnormal posture: Secondary | ICD-10-CM

## 2020-07-02 DIAGNOSIS — M6281 Muscle weakness (generalized): Secondary | ICD-10-CM | POA: Diagnosis not present

## 2020-07-02 DIAGNOSIS — R296 Repeated falls: Secondary | ICD-10-CM | POA: Diagnosis not present

## 2020-07-02 DIAGNOSIS — R262 Difficulty in walking, not elsewhere classified: Secondary | ICD-10-CM

## 2020-07-02 NOTE — Therapy (Signed)
[place], [place], [address] Phone: [phone]   Fax:  [phone]  Physical Therapy Treatment  Patient Details  Name: [name] [name] MRN: [id number] Date of Birth: [birth year] Referring Provider (PT): Dr. Irene Limbo   . Encounter Date: 07/02/2020   PT End of Session - 07/02/20 1556    Visit Number 18    Number of Visits 36    Date for PT Re-Evaluation 08/18/20    PT Start Time 1205    PT Stop Time 1255    PT Time Calculation (min) 50 min    Activity Tolerance Patient limited by pain    Behavior During Therapy [facility] for tasks assessed/performed           Past Medical History:  Diagnosis Date  . Ascending aortic aneurysm ([place])   . Bicuspid aortic valve   . Cancer ([place])   . CHF NYHA class I (no symptoms from ordinary activities), acute, diastolic ([place])   . Dysrhythmia 2009   A fib  . Fatty liver    mild  . Fracture    left proximal humerus  . GERD (gastroesophageal reflux disease)   . GI bleeding 07/21/2018   post polypectomy  . Hemorrhoids   . HTN (hypertension)   . Hypercholesteremia   . Hypokalemia   . Internal hemorrhoids   . LBP (low back pain)   . Moderate aortic stenosis   . Osteoarthritis   . Paroxysmal atrial fibrillation ([place])    a. new onset Afib in 07/2008. He underwent ibutilide cardioversion successfully. b. Recurrence 01/2013 s/p TEE/DCCV - was on Xarelto but he stopped it as he was convinced it was causing joint pn. c. Recurrence 01/2016 - spont conv to NSR. Pt took Eliquis x1 mo then declined further anticoag. d. Recurrence 07/2016.  [name] Pneumonia   . Tubular adenoma of colon     Past Surgical History:  Procedure Laterality Date  . BACK SURGERY  x12 years ago  . CARDIAC CATHETERIZATION  2020  . CARDIAC VALVE REPLACEMENT  2020  . CARDIOVERSION N/A 01/26/2013   Procedure: CARDIOVERSION;  Surgeon: Larey Dresser, MD;  Location: [facility] ENDOSCOPY;  Service: Cardiovascular;   Laterality: N/A;  . CARDIOVERSION N/A 10/28/2017   Procedure: CARDIOVERSION;  Surgeon: Larey Dresser, MD;  Location: [facility] ENDOSCOPY;  Service: Cardiovascular;  Laterality: N/A;  . CARDIOVERSION N/A 03/03/2018   Procedure: CARDIOVERSION;  Surgeon: Lelon Perla, MD;  Location: [facility] ENDOSCOPY;  Service: Cardiovascular;  Laterality: N/A;  . CARDIOVERSION N/A 09/19/2019   Procedure: CARDIOVERSION;  Surgeon: Larey Dresser, MD;  Location: [facility] ENDOSCOPY;  Service: Cardiovascular;  Laterality: N/A;  . COLONOSCOPY    . COLONOSCOPY  07/17/2018   at [facility]  . HEMORRHOID SURGERY    . INGUINAL HERNIA REPAIR Left 03/18/2020   Procedure: OPEN LEFT INGUINAL HERNIA REPAIR;  Surgeon: Alphonsa Overall, MD;  Location: [place];  Service: General;  Laterality: Left;  . LUMBAR LAMINECTOMY    . ORIF HUMERUS FRACTURE Left 01/15/2020   Procedure: OPEN REDUCTION INTERNAL FIXATION (ORIF) PROXIMAL HUMERUS FRACTURE;  Surgeon: Nicholes Stairs, MD;  Location: [place];  Service: Orthopedics;  Laterality: Left;  . POLYPECTOMY    . RIGHT HEART CATH N/A 08/20/2019   Procedure: RIGHT HEART CATH;  Surgeon: Larey Dresser, MD;  Location: [place] CV LAB;  Service: Cardiovascular;  Laterality: N/A;  . RIGHT/LEFT HEART CATH AND CORONARY ANGIOGRAPHY N/A 03/07/2019   Procedure: RIGHT/LEFT HEART CATH AND CORONARY ANGIOGRAPHY;  Surgeon: Burnell Blanks, MD;  Location: [place] CV LAB;  Service: Cardiovascular;  Laterality: N/A;  . [name]  04/2019  . TEE WITHOUT CARDIOVERSION N/A 01/26/2013   Procedure: TRANSESOPHAGEAL ECHOCARDIOGRAM (TEE);  Surgeon: Larey Dresser, MD;  Location: [place];  Service: Cardiovascular;  Laterality: N/A;  . TEE WITHOUT CARDIOVERSION N/A 10/28/2017   Procedure: TRANSESOPHAGEAL ECHOCARDIOGRAM (TEE);  Surgeon: Larey Dresser, MD;  Location: [facility] ENDOSCOPY;  Service: Cardiovascular;  Laterality: N/A;  . TEE WITHOUT CARDIOVERSION N/A 05/08/2019   Procedure: TRANSESOPHAGEAL ECHOCARDIOGRAM (TEE);   Surgeon: Burnell Blanks, MD;  Location: [place] CV LAB;  Service: Open Heart Surgery;  Laterality: N/A;  . TEE WITHOUT CARDIOVERSION N/A 09/19/2019   Procedure: TRANSESOPHAGEAL ECHOCARDIOGRAM (TEE);  Surgeon: Larey Dresser, MD;  Location: [facility] ENDOSCOPY;  Service: Cardiovascular;  Laterality: N/A;  . TRANSCATHETER AORTIC VALVE REPLACEMENT, TRANSFEMORAL N/A 05/08/2019   Procedure: TRANSCATHETER AORTIC VALVE REPLACEMENT, TRANSFEMORAL;  Surgeon: Burnell Blanks, MD;  Location: [place] CV LAB;  Service: Open Heart Surgery;  Laterality: N/A;    There were no vitals filed for this visit.   Subjective Assessment - 07/02/20 1212    Subjective Pt had to miss PT on Monday because he had so much pain in his legs ....worse than in a long time and cannot relate any cause for the pain. His legs are a little better today but he is stil not doing as well as he was last week.  He feels like this will be his last session and he will try to continue on his own at home and find other ways to help with his leg pain    Patient is accompained by: Family member    Pertinent History During preliminary work up for valve replacement in July of 2020 it was discovered that pt had large lytic lesions in his pelvis due to  multiple myeloma.  He had a TAVR in August 2021  and started chemotherapy for multiple myeloma which was successful in reducing the lestions. However,  he developed peripheral neuropathy and chemotherapy is being adjusted. He has multiple hospitalizations in dec-jan 2021 for septic colitis with acute kidney injury and pneumonia with associated deconditioning and difficutly walking due to Chemotherapy induced peripheral neuropathy.  Pt  had home health PT and continues to walk with a cane. Past history also significant for afib and back surgery.  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    Patient Stated Goals To get relief with pain in feet and decrease swelling, get more stable  and  walk without the came    Currently in Pain? Yes    Pain Score 7     Pain Location Leg    Pain Orientation Right;Left   right worse than left   Pain Descriptors / Indicators Burning    Pain Type Chronic pain    Pain Radiating Towards up the legs.    Pain Onset More than a month ago    Pain Frequency Constant    Pain Relieving Factors wife massages and pt sometimes uses a "thera spray"              [facility] PT Assessment - 07/02/20 0001      Functional Tests   Functional tests Sit to Stand      Sit to Stand   Comments 7 reps on 07/02/2020      Timed Up and Go Test   TUG Normal TUG    Normal  TUG (seconds) 14.76                         [facility] Adult PT Treatment/Exercise - 07/02/20 0001      Exercises   Exercises Shoulder;Knee/Hip;Ankle      Lumbar Exercises: Standing   Wall Slides Other (comment)    Wall Slides Limitations 3 sets of 10 with cues to keep back and hips against wall       Lumbar Exercises: Seated   Other Seated Lumbar Exercises heel raises with 7 # on each distal thigh to add resistance       Knee/Hip Exercises: Standing   Knee Flexion Strengthening;Right;Left;10 reps    Hip Abduction Stengthening;Right;Left;10 reps    Abduction Limitations hand on wall for balance       Shoulder Exercises: Seated   Row Strengthening;Right;Left;20 reps;Theraband    Theraband Level (Shoulder Row) Level 3 (Green)      Shoulder Exercises: Standing   Other Standing Exercises wall push ups  3 sets of 15 for total of 45       Ankle Exercises: Seated   BAPS Sitting;Level 1;Other (comment)    BAPS Limitations 5 minutes for each foot with cues and assist to get full ankle ROM, this required alot of concentration for the patinet                     PT Short Term Goals - 06/16/20 1205      PT SHORT TERM GOAL #1   Title Pt and wife feel that they are able to manage pt feet and ankle swelling at home    Baseline wife states she feels the swelling is  decreasing. They are using [place] and doing massage as needed    Status Achieved      PT SHORT TERM GOAL #2   Title Pt will increase # of reps of sit to stand to 10 reps in 30 seconds indicating an improvment in functional strength    Baseline 5 reps in 30 seconds on 12/31/2019, 10 reps in 30 seconds without use of hands    Status Achieved      PT SHORT TERM GOAL #3   Title Pt will decrease normal TUG score to < 14 seconds indicating an improvmemnt in functional mobility    Baseline 25.35 sec on 12/31/2019, 16.47 sec on 04/15/20, 05/21/2020 10.42 sec    Status Achieved             PT Long Term Goals - 07/02/20 1242      PT LONG TERM GOAL #1   Title Pt will report the pain in his legs is decreased to 4/10    Baseline 6/10 on eval, 5/10 at 04/15/2020, 05/21/2020 pain is 7/10,  10/4 2021, Pain at a level 7/10 on 07/02/2020    Status Not Met      PT LONG TERM GOAL #2   Title Pt will report his tiredness with walking is decreased by 50%    Baseline 05/21/2020.  Pt is improved by about 30%  07/02/2020 Pt is still limited by pain    Status Not Met      PT LONG TERM GOAL #3   Title Pt will increase # of reps of sit to stand to 12 reps in 30 seconds indicating an improvment in functional strength    Baseline 5 reps in 30 secons on 12/31/2019, 8 reps on 04/15/2020, 10 reps on 05/21/2020, 7 reps  on 10/20/201    Status Not Met      PT LONG TERM GOAL #4   Title Pt will decrease normal TUG score to < 9  seconds indicating an improvmemnt in functional mobility    Baseline 25.35 on 12/31/2019, 16.46. on 04/15/2020, 10 42 on 9/82021, 14.76 on 07/02/2020    Status Not Met      PT LONG TERM GOAL #5   Title Pt will increae BERG balancer score to > 46 for moderate risk for fall    Baseline 01/07/20 score is 36 and < 36 is high risk for falls if walking without at device ( close to 100%), 04/15/2020 score is 41    Status Not Met      PT LONG TERM GOAL #6   Title Pt will be able to do a floor to stand transfer  with min assist    Status Not Met                 Plan - 07/02/20 1557    Clinical Impression Statement [name] [name] is not doing as well this week.  He is having pain in both of his legs Rt> Lt that is limiting him.  He feels that he has tried alot and he is not getting better.  He did not do as well on his functional tests today as he did last week.  He wants to take a break from PT for a while.  Talked with pt and wife about possibly using 2 walkiing poles as he was successful with those in the past. He may also consider acupuncture to help with his pain    Personal Factors and Comorbidities Age;Comorbidity 3+;Fitness;Past/Current Experience    Comorbidities a fib, recent sepsis, kidney injury and pneumonia, ongoing chemo, past back surgery    Examination-Activity Limitations Stand;Transfers;Stairs    Stability/Clinical Decision Making Evolving/Moderate complexity    Rehab Potential Good    PT Frequency 2x / week    PT Duration 8 weeks    PT Treatment/Interventions ADLs/Self Care Home Management;DME Instruction;Gait training;Stair training;Balance training;Therapeutic exercise;Therapeutic activities;Functional mobility training;Neuromuscular re-education;Cognitive remediation;Patient/family education;Manual lymph drainage;Orthotic Fit/Training;Manual techniques;Compression bandaging;Passive range of motion;Taping    PT Next Visit Plan discharge per pt request    PT Home Exercise Plan supine scap exercise and standing squats, hip abduction and wall push ups    Consulted and Agree with Plan of Care Patient;Family member/caregiver    Family Member Consulted wife           Patient will benefit from skilled therapeutic intervention in order to improve the following deficits and impairments:  Abnormal gait, Decreased balance, Decreased endurance, Decreased mobility, Difficulty walking, Impaired sensation, Impaired perceived functional ability, Increased edema, Decreased knowledge of  precautions, Decreased activity tolerance, Decreased strength, Pain, Postural dysfunction  Visit Diagnosis: Muscle weakness (generalized)  Difficulty in walking, not elsewhere classified  Abnormal posture  Repeated falls  Lymphedema, not elsewhere classified     Problem List Patient Active Problem List   Diagnosis Date Noted  . Edema 05/20/2020  . Closed fracture of left proximal humerus 01/15/2020  . Acute cystitis without hematuria 11/07/2019  . Left inguinal hernia 11/07/2019  . Physical deconditioning   . Depression with anxiety   . Acute respiratory failure with hypoxia ([place])   . Ischemic colitis ([place]) 09/18/2019  . Pressure injury of skin 09/16/2019  . Bloody diarrhea 09/14/2019  . Abdominal pain, diffuse 09/14/2019  . Acute metabolic encephalopathy 09/32/6712  . Goals of care, counseling/discussion  09/14/2019  . DNR (do not resuscitate) 09/14/2019  . Peripheral neuropathy 09/13/2019  . Gait disorder 09/13/2019  . Multiple myeloma not having achieved remission ([place]) 05/17/2019  . Counseling regarding advance care planning and goals of care 05/17/2019  . Bone metastases ([place]) 05/17/2019  . Postoperative fever 05/09/2019  . S/P TAVR (transcatheter aortic valve replacement) 05/08/2019  . Multiple myeloma ([place]) 05/08/2019  . Acute on chronic diastolic heart failure ([place]) 05/08/2019  . Severe aortic stenosis   . CAD (coronary artery disease) 08/01/2018  . Hematochezia 07/27/2018  . HTN (hypertension) 02/28/2018  . Hypercholesteremia 02/28/2018  . Degeneration of lumbar intervertebral disc 12/06/2017  . Adenomatous polyp of ascending colon 12/02/2017  . Chronic low back pain 09/26/2017  . Mastoiditis 03/21/2017  . Abnormal TSH 12/23/2016  . PNA (pneumonia)   . AKI (acute kidney injury) ([place])   . Ascending aortic aneurysm ([place]) 02/15/2013  . Atrial fibrillation with RVR ([place]) 01/23/2013  . Well adult exam 05/31/2012  . PAF (paroxysmal atrial fibrillation) ([place])  08/05/2008  . HLD (hyperlipidemia) 04/08/2007  . Essential hypertension 04/08/2007     PHYSICAL THERAPY DISCHARGE SUMMARY  Visits from Start of Care: 18 Current functional level related to goals / functional outcomes: Pt is very limited by the uncontrolled pain in his legs    Remaining deficits: Decreased functional mobility and gait requiring use of assistive device    Education / Equipment: Home exercise  Plan: Patient agrees to discharge.  Patient goals were not met. Patient is being discharged due to the patient's request.  ?????     \     [name] [name] PT  [name] [name] 07/02/2020, [place], [place], [address] Phone: [phone]   Fax:  [phone]      Name: [name] [name] MRN: [id number] Date of Birth: 13-Jul-1941

## 2020-07-08 ENCOUNTER — Other Ambulatory Visit: Payer: Self-pay | Admitting: Internal Medicine

## 2020-07-23 DIAGNOSIS — E785 Hyperlipidemia, unspecified: Secondary | ICD-10-CM | POA: Diagnosis not present

## 2020-07-23 DIAGNOSIS — I10 Essential (primary) hypertension: Secondary | ICD-10-CM | POA: Diagnosis not present

## 2020-07-23 DIAGNOSIS — Z86010 Personal history of colonic polyps: Secondary | ICD-10-CM | POA: Diagnosis not present

## 2020-07-23 DIAGNOSIS — D369 Benign neoplasm, unspecified site: Secondary | ICD-10-CM | POA: Diagnosis not present

## 2020-07-23 DIAGNOSIS — C9001 Multiple myeloma in remission: Secondary | ICD-10-CM | POA: Diagnosis not present

## 2020-07-23 DIAGNOSIS — Z79899 Other long term (current) drug therapy: Secondary | ICD-10-CM | POA: Diagnosis not present

## 2020-07-23 DIAGNOSIS — K635 Polyp of colon: Secondary | ICD-10-CM | POA: Diagnosis not present

## 2020-07-23 DIAGNOSIS — D122 Benign neoplasm of ascending colon: Secondary | ICD-10-CM | POA: Diagnosis not present

## 2020-07-23 DIAGNOSIS — Z888 Allergy status to other drugs, medicaments and biological substances status: Secondary | ICD-10-CM | POA: Diagnosis not present

## 2020-07-23 DIAGNOSIS — Z8719 Personal history of other diseases of the digestive system: Secondary | ICD-10-CM | POA: Diagnosis not present

## 2020-07-23 DIAGNOSIS — D121 Benign neoplasm of appendix: Secondary | ICD-10-CM | POA: Diagnosis not present

## 2020-07-23 DIAGNOSIS — I4891 Unspecified atrial fibrillation: Secondary | ICD-10-CM | POA: Diagnosis not present

## 2020-07-23 DIAGNOSIS — Z7901 Long term (current) use of anticoagulants: Secondary | ICD-10-CM | POA: Diagnosis not present

## 2020-07-23 DIAGNOSIS — Z1211 Encounter for screening for malignant neoplasm of colon: Secondary | ICD-10-CM | POA: Diagnosis not present

## 2020-07-29 ENCOUNTER — Ambulatory Visit: Payer: Medicare Other

## 2020-07-31 ENCOUNTER — Other Ambulatory Visit: Payer: Self-pay | Admitting: Hematology

## 2020-07-31 DIAGNOSIS — C9000 Multiple myeloma not having achieved remission: Secondary | ICD-10-CM

## 2020-07-31 DIAGNOSIS — Z7189 Other specified counseling: Secondary | ICD-10-CM

## 2020-08-01 ENCOUNTER — Telehealth: Payer: Self-pay | Admitting: Internal Medicine

## 2020-08-01 DIAGNOSIS — C9000 Multiple myeloma not having achieved remission: Secondary | ICD-10-CM

## 2020-08-01 NOTE — Telephone Encounter (Signed)
1.Medication Requested:oxyCODONE (OXY IR/ROXICODONE) 5 MG immediate release tablet  2. Pharmacy (Name, [place], [place]): [id number] - [place], [address] Phone:  [phone]  Fax:  [phone]      3. On Med List: Y  4. Last Visit with PCP: 9.7.21  5. Next visit date with PCP: 12.8.21   Agent: Please be advised that RX refills may take up to 3 business days. We ask that you follow-up with your pharmacy.

## 2020-08-01 NOTE — Telephone Encounter (Signed)
Check [place] registry last filled 05/20/2020.Marland KitchenJohny [name]

## 2020-08-03 ENCOUNTER — Other Ambulatory Visit (HOSPITAL_COMMUNITY): Payer: Self-pay | Admitting: Cardiology

## 2020-08-03 MED ORDER — OXYCODONE HCL 5 MG PO TABS: 5.0000 mg | ORAL_TABLET | Freq: Two times a day (BID) | ORAL | 0 refills | Status: DC | PRN

## 2020-08-03 NOTE — Telephone Encounter (Signed)
Okay.  Thanks.

## 2020-08-05 ENCOUNTER — Telehealth (HOSPITAL_COMMUNITY): Payer: Self-pay | Admitting: Cardiology

## 2020-08-05 ENCOUNTER — Other Ambulatory Visit (HOSPITAL_COMMUNITY): Payer: Self-pay

## 2020-08-05 MED ORDER — SPIRONOLACTONE 25 MG PO TABS
25.0000 mg | ORAL_TABLET | Freq: Every day | ORAL | 3 refills | Status: DC
Start: 2020-08-05 — End: 2021-10-07

## 2020-08-05 MED ORDER — SPIRONOLACTONE 25 MG PO TABS
25.0000 mg | ORAL_TABLET | Freq: Every day | ORAL | 3 refills | Status: DC
Start: 2020-08-05 — End: 2020-12-10

## 2020-08-05 NOTE — Telephone Encounter (Signed)
Pt wife called to request refill for spironolactone 25 MG, please send script to [place], [place].  Pt is out of meds, contact [phone]

## 2020-08-13 ENCOUNTER — Other Ambulatory Visit: Payer: Self-pay | Admitting: Internal Medicine

## 2020-08-13 DIAGNOSIS — C9000 Multiple myeloma not having achieved remission: Secondary | ICD-10-CM

## 2020-08-13 DIAGNOSIS — Z7189 Other specified counseling: Secondary | ICD-10-CM

## 2020-08-20 ENCOUNTER — Ambulatory Visit (INDEPENDENT_AMBULATORY_CARE_PROVIDER_SITE_OTHER): Payer: Medicare Other | Admitting: Internal Medicine

## 2020-08-20 ENCOUNTER — Encounter: Payer: Self-pay | Admitting: Internal Medicine

## 2020-08-20 ENCOUNTER — Other Ambulatory Visit: Payer: Self-pay

## 2020-08-20 VITALS — BP 122/70 | HR 62 | Temp 97.7°F | Wt 180.6 lb

## 2020-08-20 DIAGNOSIS — Z23 Encounter for immunization: Secondary | ICD-10-CM | POA: Diagnosis not present

## 2020-08-20 DIAGNOSIS — I10 Essential (primary) hypertension: Secondary | ICD-10-CM | POA: Diagnosis not present

## 2020-08-20 DIAGNOSIS — R609 Edema, unspecified: Secondary | ICD-10-CM

## 2020-08-20 DIAGNOSIS — I2510 Atherosclerotic heart disease of native coronary artery without angina pectoris: Secondary | ICD-10-CM

## 2020-08-20 DIAGNOSIS — I480 Paroxysmal atrial fibrillation: Secondary | ICD-10-CM

## 2020-08-20 NOTE — Assessment & Plan Note (Signed)
Eliquis 

## 2020-08-20 NOTE — Progress Notes (Signed)
Subjective:  Patient ID: [name] [name], male    DOB: 1941-01-19  Age: 79 y.o. MRN: [id number]  CC: Follow-up (3 MONTH F/U- Need 2nd shinglerx)   HPI [name] [name] presents for  LE swelling C/o neuropathy, LE pain  F/u MM, CHF  Outpatient Medications Prior to Visit  Medication Sig Dispense Refill  . amLODipine (NORVASC) 2.5 MG tablet Take 1 tablet (2.5 mg total) by mouth daily. 90 tablet 3  . apixaban (ELIQUIS) 5 MG TABS tablet Take 5 mg by mouth 2 (two) times daily.    [name] b complex vitamins tablet Take 1 tablet by mouth daily. 100 tablet 3  . Calcium-Magnesium 500-250 MG TABS Take 1 tablet by mouth daily.    . Carboxymethylcellul-Glycerin (LUBRICATING EYE DROPS OP) Place 1 drop into both eyes daily as needed (dry eyes).    . Cholecalciferol (VITAMIN D) 50 MCG (2000 UT) tablet Take 2,000 Units by mouth daily.    . diclofenac Sodium (VOLTAREN) 1 % GEL Apply 1 application topically 2 (two) times daily as needed (pain.).    [name] DULoxetine (CYMBALTA) 60 MG capsule TAKE 1 CAPSULE BY MOUTH EVERY DAY 90 capsule 1  . famotidine (PEPCID) 40 MG tablet Take 40 mg by mouth daily.    [name] gabapentin (NEURONTIN) 300 MG capsule Take 1 capsule (300 mg total) by mouth 3 (three) times daily.    [name] LORazepam (ATIVAN) 0.5 MG tablet TAKE 1 TABLET (0.5 MG TOTAL) BY MOUTH EVERY 8 (EIGHT) HOURS AS NEEDED (NAUSEA OR VOMITING). 60 tablet 1  . metoprolol succinate (TOPROL-XL) 25 MG 24 hr tablet Take 25 mg by mouth 2 (two) times daily.    [name] oxyCODONE (OXY IR/ROXICODONE) 5 MG immediate release tablet Take 1-2 tablets (5-10 mg total) by mouth 2 (two) times daily as needed for severe pain. 60 tablet 0  . polyethylene glycol powder (GLYCOLAX/MIRALAX) 17 GM/SCOOP powder Take 17-34 g by mouth 2 (two) times daily as needed for moderate constipation. 500 g 5  . senna-docusate (SENOKOT-S) 8.6-50 MG tablet Take 2 tablets by mouth at bedtime.    [name] spironolactone (ALDACTONE) 25 MG tablet Take 1 tablet (25 mg total) by mouth daily.  30 tablet 3  . spironolactone (ALDACTONE) 25 MG tablet Take 1 tablet (25 mg total) by mouth daily. 90 tablet 3  . spironolactone (ALDACTONE) 25 MG tablet Take by mouth.    . Vitamin D, Ergocalciferol, (DRISDOL) 1.25 MG (50000 UNIT) CAPS capsule TAKE 1 CAPSULE BY MOUTH ONE TIME PER WEEK 12 capsule 3  . acyclovir (ZOVIRAX) 400 MG tablet TAKE 1 TABLET BY MOUTH TWICE A DAY (Patient not taking: Reported on 08/20/2020) 180 tablet 3  . amiodarone (PACERONE) 200 MG tablet TAKE 1 TABLET BY MOUTH EVERY DAY (Patient not taking: Reported on 08/20/2020) 90 tablet 1  . dexamethasone (DECADRON) 4 MG tablet Take 12 mg by mouth once a week. On tuesdays (Patient not taking: Reported on 08/20/2020)    . furosemide (LASIX) 20 MG tablet Take 20 mg by mouth every other day. (Patient not taking: Reported on 05/20/2020)     No facility-administered medications prior to visit.    ROS: Review of Systems  Constitutional: Negative for appetite change, fatigue and unexpected weight change.  HENT: Negative for congestion, nosebleeds, sneezing, sore throat and trouble swallowing.   Eyes: Negative for itching and visual disturbance.  Respiratory: Negative for cough.   Cardiovascular: Negative for chest pain, palpitations and leg swelling.  Gastrointestinal: Negative for abdominal distention, blood in stool, diarrhea and  nausea.  Genitourinary: Negative for frequency and hematuria.  Musculoskeletal: Positive for gait problem. Negative for back pain, joint swelling and neck pain.  Skin: Negative for rash.  Neurological: Negative for dizziness, tremors, speech difficulty and weakness.  Psychiatric/Behavioral: Positive for decreased concentration. Negative for agitation, dysphoric mood and sleep disturbance. The patient is not nervous/anxious.     Objective:  BP 122/70 (BP Location: Left Arm)   Pulse 62   Temp 97.7 F (36.5 C) (Oral)   Wt 180 lb 9.6 oz (81.9 kg)   SpO2 96%   BMI 25.19 kg/m   BP Readings from Last 3  Encounters:  08/20/20 122/70  06/03/20 110/73  05/20/20 120/66    Wt Readings from Last 3 Encounters:  08/20/20 180 lb 9.6 oz (81.9 kg)  06/03/20 175 lb 1.6 oz (79.4 kg)  05/20/20 175 lb (79.4 kg)    Physical Exam Constitutional:      General: He is not in acute distress.    Appearance: He is well-developed.     Comments: NAD  Eyes:     Conjunctiva/sclera: Conjunctivae normal.     Pupils: Pupils are equal, round, and reactive to light.  Neck:     Thyroid: No thyromegaly.     Vascular: No JVD.  Cardiovascular:     Rate and Rhythm: Normal rate. Rhythm irregular.     Heart sounds: Normal heart sounds. No murmur heard.  No friction rub. No gallop.   Pulmonary:     Effort: Pulmonary effort is normal. No respiratory distress.     Breath sounds: Normal breath sounds. No wheezing or rales.  Chest:     Chest wall: No tenderness.  Abdominal:     General: Bowel sounds are normal. There is no distension.     Palpations: Abdomen is soft. There is no mass.     Tenderness: There is no abdominal tenderness. There is no guarding or rebound.  Musculoskeletal:        General: Tenderness present. Normal range of motion.     Cervical back: Normal range of motion.     Right lower leg: Edema present.     Left lower leg: Edema present.  Lymphadenopathy:     Cervical: No cervical adenopathy.  Skin:    General: Skin is warm and dry.     Findings: No rash.  Neurological:     Mental Status: He is alert and oriented to person, place, and time.     Cranial Nerves: No cranial nerve deficit.     Motor: Weakness present. No abnormal muscle tone.     Coordination: Coordination normal.     Gait: Gait abnormal.     Deep Tendon Reflexes: Reflexes are normal and symmetric.  Psychiatric:        Behavior: Behavior normal.        Thought Content: Thought content normal.        Judgment: Judgment normal.   L>R edema 1+ Cane    A total time of >45 minutes was spent preparing to see the patient,  reviewing tests, x-rays, operative reports and outside records.  Also, obtaining history and performing comprehensive physical exam.  Additionally, counseling the patient regarding the above listed issues.   Finally, documenting clinical information in the health records, coordination of care, educating the patient re LE neuropathy, edema.    Lab Results  Component Value Date   WBC 8.9 06/03/2020   HGB 12.8 (L) 06/03/2020   HCT 39.4 06/03/2020   PLT 136 (L)  06/03/2020   GLUCOSE 116 (H) 06/03/2020   CHOL 144 08/01/2018   TRIG 167.0 (H) 08/01/2018   HDL 28.20 (L) 08/01/2018   LDLDIRECT 151.5 11/20/2007   LDLCALC 82 08/01/2018   ALT 39 06/23/2020   AST 29 06/23/2020   NA 141 06/03/2020   K 4.3 06/03/2020   CL 108 06/03/2020   CREATININE 1.17 06/03/2020   BUN 23 06/03/2020   CO2 23 06/03/2020   TSH 5.338 (H) 06/23/2020   PSA 4.20 (H) 08/01/2018   INR 1.2 01/07/2020   HGBA1C 5.8 (H) 05/04/2019    No results found.  Assessment & Plan:

## 2020-08-20 NOTE — Patient Instructions (Addendum)
   B-complex with Niacin 100 mg    Lion's mane  Stop Amlodipine

## 2020-08-20 NOTE — Assessment & Plan Note (Signed)
Toprol, spironolactone, potassium, amlodipine

## 2020-08-20 NOTE — Assessment & Plan Note (Signed)
D/c Norvasc

## 2020-08-26 ENCOUNTER — Other Ambulatory Visit: Payer: Self-pay

## 2020-08-26 ENCOUNTER — Inpatient Hospital Stay: Payer: Medicare Other | Attending: Hematology

## 2020-08-26 ENCOUNTER — Inpatient Hospital Stay (HOSPITAL_BASED_OUTPATIENT_CLINIC_OR_DEPARTMENT_OTHER): Payer: Medicare Other | Admitting: Hematology

## 2020-08-26 ENCOUNTER — Other Ambulatory Visit: Payer: Self-pay | Admitting: *Deleted

## 2020-08-26 ENCOUNTER — Inpatient Hospital Stay: Payer: Medicare Other

## 2020-08-26 VITALS — BP 133/66 | HR 60 | Temp 98.0°F | Resp 16 | Ht 71.0 in | Wt 179.3 lb

## 2020-08-26 DIAGNOSIS — Z23 Encounter for immunization: Secondary | ICD-10-CM | POA: Diagnosis not present

## 2020-08-26 DIAGNOSIS — C9000 Multiple myeloma not having achieved remission: Secondary | ICD-10-CM | POA: Diagnosis not present

## 2020-08-26 DIAGNOSIS — G629 Polyneuropathy, unspecified: Secondary | ICD-10-CM | POA: Diagnosis not present

## 2020-08-26 DIAGNOSIS — I2510 Atherosclerotic heart disease of native coronary artery without angina pectoris: Secondary | ICD-10-CM

## 2020-08-26 DIAGNOSIS — Z7189 Other specified counseling: Secondary | ICD-10-CM

## 2020-08-26 DIAGNOSIS — C7951 Secondary malignant neoplasm of bone: Secondary | ICD-10-CM

## 2020-08-26 LAB — CMP (CANCER CENTER ONLY)
ALT: 20 U/L (ref 0–44)
AST: 21 U/L (ref 15–41)
Albumin: 4.2 g/dL (ref 3.5–5.0)
Alkaline Phosphatase: 50 U/L (ref 38–126)
Anion gap: 9 (ref 5–15)
BUN: 16 mg/dL (ref 8–23)
CO2: 26 mmol/L (ref 22–32)
Calcium: 9.8 mg/dL (ref 8.9–10.3)
Chloride: 109 mmol/L (ref 98–111)
Creatinine: 1.3 mg/dL — ABNORMAL HIGH (ref 0.61–1.24)
GFR, Estimated: 56 mL/min — ABNORMAL LOW (ref >60)
Glucose, Bld: 98 mg/dL (ref 70–99)
Potassium: 4.5 mmol/L (ref 3.5–5.1)
Sodium: 144 mmol/L (ref 135–145)
Total Bilirubin: 0.7 mg/dL (ref 0.3–1.2)
Total Protein: 6.5 g/dL (ref 6.5–8.1)

## 2020-08-26 LAB — CBC WITH DIFFERENTIAL (CANCER CENTER ONLY)
Abs Immature Granulocytes: 0.02 10*3/uL (ref 0.00–0.07)
Basophils Absolute: 0.1 10*3/uL (ref 0.0–0.1)
Basophils Relative: 1 %
Eosinophils Absolute: 0.5 10*3/uL (ref 0.0–0.5)
Eosinophils Relative: 7 %
HCT: 42.3 % (ref 39.0–52.0)
Hemoglobin: 13.8 g/dL (ref 13.0–17.0)
Immature Granulocytes: 0 %
Lymphocytes Relative: 17 %
Lymphs Abs: 1.1 10*3/uL (ref 0.7–4.0)
MCH: 30.2 pg (ref 26.0–34.0)
MCHC: 32.6 g/dL (ref 30.0–36.0)
MCV: 92.6 fL (ref 80.0–100.0)
Monocytes Absolute: 0.6 10*3/uL (ref 0.1–1.0)
Monocytes Relative: 9 %
Neutro Abs: 4.3 10*3/uL (ref 1.7–7.7)
Neutrophils Relative %: 66 %
Platelet Count: 159 10*3/uL (ref 150–400)
RBC: 4.57 MIL/uL (ref 4.22–5.81)
RDW: 12.3 % (ref 11.5–15.5)
WBC Count: 6.5 10*3/uL (ref 4.0–10.5)
nRBC: 0 % (ref 0.0–0.2)

## 2020-08-26 MED ORDER — INFLUENZA VAC A&B SA ADJ QUAD 0.5 ML IM PRSY
0.5000 mL | PREFILLED_SYRINGE | Freq: Once | INTRAMUSCULAR | Status: AC
  Administered 2020-08-26: 0.5 mL via INTRAMUSCULAR

## 2020-08-26 MED ORDER — ZOLEDRONIC ACID 4 MG/100ML IV SOLN
4.0000 mg | Freq: Once | INTRAVENOUS | Status: AC
  Administered 2020-08-26: 4 mg via INTRAVENOUS

## 2020-08-26 MED ORDER — SODIUM CHLORIDE 0.9 % IV SOLN
INTRAVENOUS | Status: DC
  Filled 2020-08-26: qty 250

## 2020-08-26 MED ORDER — ZOLEDRONIC ACID 4 MG/100ML IV SOLN
INTRAVENOUS | Status: AC
  Filled 2020-08-26: qty 100

## 2020-08-26 MED ORDER — INFLUENZA VAC A&B SA ADJ QUAD 0.5 ML IM PRSY
PREFILLED_SYRINGE | INTRAMUSCULAR | Status: AC
  Filled 2020-08-26: qty 0.5

## 2020-08-26 NOTE — Patient Instructions (Signed)
Zoledronic Acid injection (Hypercalcemia, Oncology) What is this medicine? ZOLEDRONIC ACID (ZOE le dron ik AS id) lowers the amount of calcium loss from bone. It is used to treat too much calcium in your blood from cancer. It is also used to prevent complications of cancer that has spread to the bone. This medicine may be used for other purposes; ask your health care provider or pharmacist if you have questions. COMMON BRAND NAME(S): Zometa What should I tell my health care provider before I take this medicine? They need to know if you have any of these conditions:  aspirin-sensitive asthma  cancer, especially if you are receiving medicines used to treat cancer  dental disease or wear dentures  infection  kidney disease  receiving corticosteroids like dexamethasone or prednisone  an unusual or allergic reaction to zoledronic acid, other medicines, foods, dyes, or preservatives  pregnant or trying to get pregnant  breast-feeding How should I use this medicine? This medicine is for infusion into a vein. It is given by a health care professional in a hospital or clinic setting. Talk to your pediatrician regarding the use of this medicine in children. Special care may be needed. Overdosage: If you think you have taken too much of this medicine contact a poison control center or emergency room at once. NOTE: This medicine is only for you. Do not share this medicine with others. What if I miss a dose? It is important not to miss your dose. Call your doctor or health care professional if you are unable to keep an appointment. What may interact with this medicine?  certain antibiotics given by injection  NSAIDs, medicines for pain and inflammation, like ibuprofen or naproxen  some diuretics like bumetanide, furosemide  teriparatide  thalidomide This list may not describe all possible interactions. Give your health care provider a list of all the medicines, herbs, non-prescription  drugs, or dietary supplements you use. Also tell them if you smoke, drink alcohol, or use illegal drugs. Some items may interact with your medicine. What should I watch for while using this medicine? Visit your doctor or health care professional for regular checkups. It may be some time before you see the benefit from this medicine. Do not stop taking your medicine unless your doctor tells you to. Your doctor may order blood tests or other tests to see how you are doing. Women should inform their doctor if they wish to become pregnant or think they might be pregnant. There is a potential for serious side effects to an unborn child. Talk to your health care professional or pharmacist for more information. You should make sure that you get enough calcium and vitamin D while you are taking this medicine. Discuss the foods you eat and the vitamins you take with your health care professional. Some people who take this medicine have severe bone, joint, and/or muscle pain. This medicine may also increase your risk for jaw problems or a broken thigh bone. Tell your doctor right away if you have severe pain in your jaw, bones, joints, or muscles. Tell your doctor if you have any pain that does not go away or that gets worse. Tell your dentist and dental surgeon that you are taking this medicine. You should not have major dental surgery while on this medicine. See your dentist to have a dental exam and fix any dental problems before starting this medicine. Take good care of your teeth while on this medicine. Make sure you see your dentist for regular follow-up   appointments. What side effects may I notice from receiving this medicine? Side effects that you should report to your doctor or health care professional as soon as possible:  allergic reactions like skin rash, itching or hives, swelling of the face, lips, or tongue  anxiety, confusion, or depression  breathing problems  changes in vision  eye  pain  feeling faint or lightheaded, falls  jaw pain, especially after dental work  mouth sores  muscle cramps, stiffness, or weakness  redness, blistering, peeling or loosening of the skin, including inside the mouth  trouble passing urine or change in the amount of urine Side effects that usually do not require medical attention (report to your doctor or health care professional if they continue or are bothersome):  bone, joint, or muscle pain  constipation  diarrhea  fever  hair loss  irritation at site where injected  loss of appetite  nausea, vomiting  stomach upset  trouble sleeping  trouble swallowing  weak or tired This list may not describe all possible side effects. Call your doctor for medical advice about side effects. You may report side effects to FDA at 1-800-FDA-1088. Where should I keep my medicine? This drug is given in a hospital or clinic and will not be stored at home. NOTE: This sheet is a summary. It may not cover all possible information. If you have questions about this medicine, talk to your doctor, pharmacist, or health care provider.  2020 [employer] (2014-01-26 14:19:39)  

## 2020-08-26 NOTE — Progress Notes (Signed)
HEMATOLOGY/ONCOLOGY CLINIC NOTE  Date of Service: 08/26/2020  Patient Care Team: Cassandria Anger, MD as PCP - General Larey Dresser, MD as PCP - Advanced Heart Failure (Cardiology) Larey Dresser, MD as PCP - Cardiology (Cardiology) Erline Levine, MD as Attending Physician (Neurosurgery) Jarome Matin, MD as Consulting Physician (Dermatology) Rigoberto Noel, MD as Consulting Physician (Pulmonary Disease) Alda Berthold, DO as Consulting Physician (Neurology)   CHIEF COMPLAINTS/PURPOSE OF CONSULTATION:   Plasma cell myeloma   HISTORY OF PRESENTING ILLNESS:  [name] [name] is a wonderful 79 y.o. male who has been referred to [place] by Angelena Form, PA for evaluation and management of lytic bone lesions. The pt reports that he is doing well overall.  The pt reports that he has occasional hip pain that radiates down his legs and prevents him from walking. He uses Advil, which helps his hip pain. He is used to exercising often, but has not been able to stay very physically active lately so he is gaining weight. The pt experiences some SOB when he wakes up in the morning.  The pt had a pre-procedural CTA C/A/P before a TAVR completed on 03/14/2019 which revealed "indeterminate osseus lesions in the bony pelvis," which led to an MRI and PET scan. He reports that he has pain when he pushes on his right chest.   He also notes that he had a fall while exercising in 06/2018 and thought he broke his back. He received a blood transfusion on 07/24/2018.   Of note prior to the patient's visit today, the pt has had a MRI pelvis w/wo contrast completed on 03/28/2019 with results revealing "1. Destructive bone lesions as detailed above. Findings most consistent with metastatic disease. PET-CT may be helpful for further evaluation and to establish a primary tumor. The right pelvic bone lesions should be amenable to image guided biopsy but a PET scan may demonstrate easier/safer biopsy sites. 2.  No intrapelvic mass or adenopathy. 3. Benign intraosseous lipoma involving the left anterior superior acetabulum."  The pt has also had PET whole body completed on 04/06/2019 with results revealing "1. Diffuse osseous metastatic disease as detailed above without findings for a primary neoplasm in the chest, abdomen or pelvis. The large destructive lesion involving the right ischium should be amenable to image guided biopsy. 2. Two small retroperitoneal lymph nodes and 1 small right obturator node showing hypermetabolism."   Most recent lab results (04/06/2019) of CBC is as follows: all values are WNL.  On review of systems, pt reports hip and leg pain, weight gain and denies syncope and denies belly pain, recent neuropathy and any other symptoms.   On PMHx the pt reports 5 cm hepatic flexure polyp removal, pneumonia, blood transfusion on 07/24/2018  On Social Hx the pt reports that he lives at home with his wife. He is from [place].    INTERVAL HISTORY: [name] [name] is a wonderful 79 y.o. male who has who is here today for evaluation and management of his Plasma Cell Myeloma. Pt is accompanied by his wife. The patient's last visit with [place] was on 06/03/2020. The pt reports that he is doing well overall.  The pt reports that he feels that his lower leg pain is worse than before. Pt is using Gabapentin as prescribed and one Oxycodone at night for pain management. He has been wearing compression socks regularly. Pt notes that topical Voltaren is helping with his lower extremity pain.   He is experiencing a signficiant amount  of acid reflux for which he has been taking 40 mg Pepcid per day. Pt received his Shingrix vaccine 1-2 weeks ago with Dr. Alain Marion and tolerated it well.   Lab results today (08/26/20) of CBC w/diff and CMP is as follows: all values are WNL except for Creatinine at 1.30, GFR Est at 56. 08/26/2020 MMP is in progress 08/26/2020 K/L light chains is in progress  On review of  systems, pt reports weakness, chronic leg pain, acid reflux and denies new bone pain and any other symptoms.   MEDICAL HISTORY:  Past Medical History:  Diagnosis Date  . Ascending aortic aneurysm ([place])   . Bicuspid aortic valve   . Cancer ([place])   . CHF NYHA class I (no symptoms from ordinary activities), acute, diastolic ([place])   . Dysrhythmia 2009   A fib  . Fatty liver    mild  . Fracture    left proximal humerus  . GERD (gastroesophageal reflux disease)   . GI bleeding 07/21/2018   post polypectomy  . Hemorrhoids   . HTN (hypertension)   . Hypercholesteremia   . Hypokalemia   . Internal hemorrhoids   . LBP (low back pain)   . Moderate aortic stenosis   . Osteoarthritis   . Paroxysmal atrial fibrillation ([place])    a. new onset Afib in 07/2008. He underwent ibutilide cardioversion successfully. b. Recurrence 01/2013 s/p TEE/DCCV - was on Xarelto but he stopped it as he was convinced it was causing joint pn. c. Recurrence 01/2016 - spont conv to NSR. Pt took Eliquis x1 mo then declined further anticoag. d. Recurrence 07/2016.  [name] Pneumonia   . Tubular adenoma of colon     SURGICAL HISTORY: Past Surgical History:  Procedure Laterality Date  . BACK SURGERY  x12 years ago  . CARDIAC CATHETERIZATION  2020  . CARDIAC VALVE REPLACEMENT  2020  . CARDIOVERSION N/A 01/26/2013   Procedure: CARDIOVERSION;  Surgeon: Larey Dresser, MD;  Location: [facility] ENDOSCOPY;  Service: Cardiovascular;  Laterality: N/A;  . CARDIOVERSION N/A 10/28/2017   Procedure: CARDIOVERSION;  Surgeon: Larey Dresser, MD;  Location: [facility] ENDOSCOPY;  Service: Cardiovascular;  Laterality: N/A;  . CARDIOVERSION N/A 03/03/2018   Procedure: CARDIOVERSION;  Surgeon: Lelon Perla, MD;  Location: [facility] ENDOSCOPY;  Service: Cardiovascular;  Laterality: N/A;  . CARDIOVERSION N/A 09/19/2019   Procedure: CARDIOVERSION;  Surgeon: Larey Dresser, MD;  Location: [facility] ENDOSCOPY;  Service: Cardiovascular;  Laterality: N/A;  .  COLONOSCOPY    . COLONOSCOPY  07/17/2018   at [facility]  . HEMORRHOID SURGERY    . INGUINAL HERNIA REPAIR Left 03/18/2020   Procedure: OPEN LEFT INGUINAL HERNIA REPAIR;  Surgeon: Alphonsa Overall, MD;  Location: [place];  Service: General;  Laterality: Left;  . LUMBAR LAMINECTOMY    . ORIF HUMERUS FRACTURE Left 01/15/2020   Procedure: OPEN REDUCTION INTERNAL FIXATION (ORIF) PROXIMAL HUMERUS FRACTURE;  Surgeon: Nicholes Stairs, MD;  Location: [place];  Service: Orthopedics;  Laterality: Left;  . POLYPECTOMY    . RIGHT HEART CATH N/A 08/20/2019   Procedure: RIGHT HEART CATH;  Surgeon: Larey Dresser, MD;  Location: [place] CV LAB;  Service: Cardiovascular;  Laterality: N/A;  . RIGHT/LEFT HEART CATH AND CORONARY ANGIOGRAPHY N/A 03/07/2019   Procedure: RIGHT/LEFT HEART CATH AND CORONARY ANGIOGRAPHY;  Surgeon: Burnell Blanks, MD;  Location: [place] CV LAB;  Service: Cardiovascular;  Laterality: N/A;  . [name]  04/2019  . TEE WITHOUT CARDIOVERSION N/A 01/26/2013   Procedure:  TRANSESOPHAGEAL ECHOCARDIOGRAM (TEE);  Surgeon: Larey Dresser, MD;  Location: [place];  Service: Cardiovascular;  Laterality: N/A;  . TEE WITHOUT CARDIOVERSION N/A 10/28/2017   Procedure: TRANSESOPHAGEAL ECHOCARDIOGRAM (TEE);  Surgeon: Larey Dresser, MD;  Location: [facility] ENDOSCOPY;  Service: Cardiovascular;  Laterality: N/A;  . TEE WITHOUT CARDIOVERSION N/A 05/08/2019   Procedure: TRANSESOPHAGEAL ECHOCARDIOGRAM (TEE);  Surgeon: Burnell Blanks, MD;  Location: [place] CV LAB;  Service: Open Heart Surgery;  Laterality: N/A;  . TEE WITHOUT CARDIOVERSION N/A 09/19/2019   Procedure: TRANSESOPHAGEAL ECHOCARDIOGRAM (TEE);  Surgeon: Larey Dresser, MD;  Location: [facility] ENDOSCOPY;  Service: Cardiovascular;  Laterality: N/A;  . TRANSCATHETER AORTIC VALVE REPLACEMENT, TRANSFEMORAL N/A 05/08/2019   Procedure: TRANSCATHETER AORTIC VALVE REPLACEMENT, TRANSFEMORAL;  Surgeon: Burnell Blanks, MD;  Location: [place]  CV LAB;  Service: Open Heart Surgery;  Laterality: N/A;    SOCIAL HISTORY: Social History   Socioeconomic History  . Marital status: Married    Spouse name: Not on file  . Number of children: 0  . Years of education: Not on file  . Highest education level: Not on file  Occupational History  . Occupation: Retired Lobbyist: [employer]  Tobacco Use  . Smoking status: Never Smoker  . Smokeless tobacco: Never Used  Vaping Use  . Vaping Use: Never used  Substance and Sexual Activity  . Alcohol use: Not Currently  . Drug use: No  . Sexual activity: Yes  Other Topics Concern  . Not on file  Social History Narrative   Patient lives in [place] w/ his wife. He is a native of [place]. 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   Right-handed   Caffeine: 2 cups coffee per day   Two story home   Social Determinants of Health   Financial Resource Strain: Not on file  Food Insecurity: Not on file  Transportation Needs: Not on file  Physical Activity: Not on file  Stress: Not on file  Social Connections: Not on file  Intimate Partner Violence: Not on file    FAMILY HISTORY: Family History  Problem Relation Age of Onset  . Colon cancer Mother 75  . Hypertension Other   . Coronary artery disease Neg Hx   . Colon polyps Neg Hx   . Esophageal cancer Neg Hx   . Rectal cancer Neg Hx   . Stomach cancer Neg Hx     ALLERGIES:  is allergic to xarelto [rivaroxaban], ambien [zolpidem], corticosteroids, ramipril, and benazepril.  MEDICATIONS:  Current Outpatient Medications  Medication Sig Dispense Refill  . apixaban (ELIQUIS) 5 MG TABS tablet Take 5 mg by mouth 2 (two) times daily.    [name] b complex vitamins tablet Take 1 tablet by mouth daily. 100 tablet 3  . Calcium-Magnesium 500-250 MG TABS Take 1 tablet by mouth daily.    . Carboxymethylcellul-Glycerin (LUBRICATING EYE DROPS OP) Place 1 drop into both eyes daily as needed  (dry eyes).    . Cholecalciferol (VITAMIN D) 50 MCG (2000 UT) tablet Take 2,000 Units by mouth daily.    . diclofenac Sodium (VOLTAREN) 1 % GEL Apply 1 application topically 2 (two) times daily as needed (pain.).    [name] DULoxetine (CYMBALTA) 60 MG capsule TAKE 1 CAPSULE BY MOUTH EVERY DAY 90 capsule 1  . famotidine (PEPCID) 40 MG tablet Take 40 mg by mouth daily.    [name] gabapentin (NEURONTIN) 300 MG capsule Take 1 capsule (  300 mg total) by mouth 3 (three) times daily.    [name] LORazepam (ATIVAN) 0.5 MG tablet TAKE 1 TABLET (0.5 MG TOTAL) BY MOUTH EVERY 8 (EIGHT) HOURS AS NEEDED (NAUSEA OR VOMITING). 60 tablet 1  . metoprolol succinate (TOPROL-XL) 25 MG 24 hr tablet Take 25 mg by mouth 2 (two) times daily.    [name] oxyCODONE (OXY IR/ROXICODONE) 5 MG immediate release tablet Take 1-2 tablets (5-10 mg total) by mouth 2 (two) times daily as needed for severe pain. 60 tablet 0  . polyethylene glycol powder (GLYCOLAX/MIRALAX) 17 GM/SCOOP powder Take 17-34 g by mouth 2 (two) times daily as needed for moderate constipation. 500 g 5  . senna-docusate (SENOKOT-S) 8.6-50 MG tablet Take 2 tablets by mouth at bedtime.    [name] spironolactone (ALDACTONE) 25 MG tablet Take 1 tablet (25 mg total) by mouth daily. 30 tablet 3  . spironolactone (ALDACTONE) 25 MG tablet Take 1 tablet (25 mg total) by mouth daily. 90 tablet 3  . spironolactone (ALDACTONE) 25 MG tablet Take by mouth.    . Vitamin D, Ergocalciferol, (DRISDOL) 1.25 MG (50000 UNIT) CAPS capsule TAKE 1 CAPSULE BY MOUTH ONE TIME PER WEEK 12 capsule 3   No current facility-administered medications for this visit.    REVIEW OF SYSTEMS:   A 10+ POINT REVIEW OF SYSTEMS WAS OBTAINED including neurology, dermatology, psychiatry, cardiac, respiratory, lymph, extremities, GI, GU, Musculoskeletal, constitutional, breasts, reproductive, HEENT.  All pertinent positives are noted in the HPI.  All others are negative.   PHYSICAL EXAMINATION: There were no vitals filed for this  visit. Wt Readings from Last 3 Encounters:  08/20/20 180 lb 9.6 oz (81.9 kg)  06/03/20 175 lb 1.6 oz (79.4 kg)  05/20/20 175 lb (79.4 kg)   There is no height or weight on file to calculate BMI.    ECOG FS:2 - Symptomatic, <50% confined to bed  Exam was given in a chair   GENERAL:alert, in no acute distress and comfortable SKIN: no acute rashes, no significant lesions EYES: conjunctiva are pink and non-injected, sclera anicteric OROPHARYNX: MMM, no exudates, no oropharyngeal erythema or ulceration NECK: supple, no JVD LYMPH:  no palpable lymphadenopathy in the cervical, axillary or inguinal regions LUNGS: clear to auscultation b/l with normal respiratory effort HEART: regular rate & rhythm ABDOMEN:  normoactive bowel sounds , non tender, not distended. No palpable hepatosplenomegaly.  Extremity: no pedal edema PSYCH: alert & oriented x 3 with fluent speech NEURO: no focal motor/sensory deficits  LABORATORY DATA:  I have reviewed the data as listed  . CBC Latest Ref Rng & Units 06/03/2020 04/08/2020 03/12/2020  WBC 4.0 - 10.5 K/uL 8.9 9.4 19.9(H)  Hemoglobin 13.0 - 17.0 g/dL 12.8(L) 13.3 12.9(L)  Hematocrit 39.0 - 52.0 % 39.4 41.7 40.4  Platelets 150 - 400 K/uL 136(L) 130(L) 178    . CMP Latest Ref Rng & Units 06/23/2020 06/03/2020 04/25/2020  Glucose 70 - 99 mg/dL - 116(H) 110(H)  BUN 8 - 23 mg/dL - 23 27(H)  Creatinine 0.61 - 1.24 mg/dL - 1.17 1.25(H)  Sodium 135 - 145 mmol/L - 141 141  Potassium 3.5 - 5.1 mmol/L - 4.3 4.1  Chloride 98 - 111 mmol/L - 108 109  CO2 22 - 32 mmol/L - 23 24  Calcium 8.9 - 10.3 mg/dL - 9.0 9.3  Total Protein 6.5 - 8.1 g/dL 5.7(L) 5.9(L) 5.7(L)  Total Bilirubin 0.3 - 1.2 mg/dL 0.9 0.5 0.8  Alkaline Phos 38 - 126 U/L 54 60 55  AST 15 - 41 U/L 29 20 41  ALT 0 - 44 U/L 39 32 58(H)   Component     Latest Ref Rng & Units 04/30/2019  Total Protein, Urine-UPE24     Not Estab. mg/dL   Total Protein, Urine-Ur/day     30 - 150 mg/24 hr   ALBUMIN,  U     %   ALPHA 1 URINE     %   Alpha 2, Urine     %   % BETA, Urine     %   GAMMA GLOBULIN URINE     %   Free Kappa Lt Chains,Ur     0.63 - 113.79 mg/L   Free Lambda Lt Chains,Ur     0.47 - 11.77 mg/L   Free Kappa/Lambda Ratio     1.03 - 31.76   Immunofixation Result, Urine        Total Volume        M-SPIKE %, Urine     Not Observed %   M-Spike, mg/24 hr     Not Observed mg/24 hr   NOTE:        IgG (Immunoglobin G), Serum     603 - 1,613 mg/dL 1,740 (H)  IgA     61 - 437 mg/dL 16 (L)  IgM (Immunoglobulin M), Srm     15 - 143 mg/dL 9 (L)  Total Protein ELP     6.0 - 8.5 g/dL 7.3  Albumin SerPl Elph-Mcnc     2.9 - 4.4 g/dL 3.8  Alpha 1     0.0 - 0.4 g/dL 0.3  Alpha2 Glob SerPl Elph-Mcnc     0.4 - 1.0 g/dL 0.7  B-Globulin SerPl Elph-Mcnc     0.7 - 1.3 g/dL 0.9  Gamma Glob SerPl Elph-Mcnc     0.4 - 1.8 g/dL 1.6  M Protein SerPl Elph-Mcnc     Not Observed g/dL 1.5 (H)  Globulin, Total     2.2 - 3.9 g/dL 3.5  Albumin/Glob SerPl     0.7 - 1.7 1.1  IFE 1      Comment  Please Note (HCV):      Comment  Kappa free light chain     3.3 - 19.4 mg/L 821.2 (H)  Lamda free light chains     5.7 - 26.3 mg/L 1.9 (L)  Kappa, lamda light chain ratio     0.26 - 1.65 432.21 (H)  LDH     98 - 192 U/L 132  Sed Rate     0 - 16 mm/hr 3      05/24/2019 Bone Marrow Biopsy    04/16/2019 Surgical Pathology:   RADIOGRAPHIC STUDIES: I have personally reviewed the radiological images as listed and agreed with the findings in the report. No results found.  ASSESSMENT & PLAN:   79 yo   #1 Recently diagnosed Plasma cell myeloma  03/28/2019 MRI pelvis w/wo contrast revealed "1. Destructive bone lesions as detailed above. Findings most consistent with metastatic disease. PET-CT may be helpful for further evaluation and to establish a primary tumor. The right pelvic bone lesions should be amenable to image guided biopsy but a PET scan may demonstrate easier/safer biopsy  sites. 2. No intrapelvic mass or adenopathy. 3. Benign intraosseous lipoma involving the left anterior superior acetabulum."  04/06/2019 PET whole body revealed "1. Diffuse osseous metastatic disease as detailed above without findings for a primary neoplasm in the chest, abdomen or pelvis. The large destructive lesion involving  the right ischium should be amenable to image guided biopsy. 2. Two small retroperitoneal lymph nodes and 1 small right obturator node showing hypermetabolism."   04/16/2019 Posterior right pelvis bone biopsy revealed "PLASMA CELL NEOPLASM"  05/24/2019 Bone Marrow Biopsy revealed "BONE MARROW: - CELLULAR MARROW WITH INVOLVEMENT BY PLASMA CELL NEOPLASM (20%) PERIPHERAL BLOOD: - MORPHOLOGICALLY UNREMARKABLE"  05/24/2019 FISH Panel revealed "ABNORMAL result with 11q+, 14q+ and +17"  #2 Severe aortic stenosis with bicuspid aortic valve -10/26/2018 ECHO revealed AVA at 0.8 cm2 and LV EF of 60-65% -05/08/2019 pt had a Transfemoral Transcatheter Aortic Valve Replacement   PLAN: -Discussed pt labwork today, 08/26/20; blood counts are nml, blood chemistries are stable. MMP & K/L light chains are in progress.  -Discussed 06/03/2020 M Protein is "Not Observed", K/L light chain ratio is WNL. -Based on labs, symptoms, and clinical exam pt continues to be in remission from Multiple Myeloma at this time. -No indication to restart treatment. Will continue watchful observation.  -Advised pt symptoms to be aware of, such as new, localized bone pain.  -Recommend pt try an OTC proton-pump inhibitor like Prilozec. During this period hold Pepcid.  -Recommend TENS unit for leg pain -Continue Gabapentin as prescribed  -Continue Oxycodone prn. -Continue Zometa q46month -Will give annual flu vaccine in clinic today.  -Will see back in 3 months with labs    FOLLOW UP: RTC with Dr KIrene Limbowith labs and next Zometa infusion in 3 months   The total time spent in the appt was 35 minutes and  more than 50% was on counseling and direct patient cares.  All of the patient's questions were answered with apparent satisfaction. The patient knows to call the clinic with any problems, questions or concerns.   GSullivan LoneMD MHillroseAAHIVMS SLakeview Regional Medical CenterCEndo Surgical Center Of North JerseyHematology/Oncology Physician [facility]ib[facility] (Office):       [phone](Work cell):  [phone](Fax):           [phone] 08/26/2020 7:47 AM  I, JYevette Edwards am acting as a scribe for Dr. GSullivan Lone   .I have reviewed the above documentation for accuracy and completeness, and I agree with the above. .Brunetta GeneraMD

## 2020-08-27 LAB — MULTIPLE MYELOMA PANEL, SERUM
Albumin SerPl Elph-Mcnc: 3.9 g/dL (ref 2.9–4.4)
Albumin/Glob SerPl: 2 — ABNORMAL HIGH (ref 0.7–1.7)
Alpha 1: 0.3 g/dL (ref 0.0–0.4)
Alpha2 Glob SerPl Elph-Mcnc: 0.6 g/dL (ref 0.4–1.0)
B-Globulin SerPl Elph-Mcnc: 0.9 g/dL (ref 0.7–1.3)
Gamma Glob SerPl Elph-Mcnc: 0.3 g/dL — ABNORMAL LOW (ref 0.4–1.8)
Globulin, Total: 2 g/dL — ABNORMAL LOW (ref 2.2–3.9)
IgA: 52 mg/dL — ABNORMAL LOW (ref 61–437)
IgG (Immunoglobin G), Serum: 342 mg/dL — ABNORMAL LOW (ref 603–1613)
IgM (Immunoglobulin M), Srm: 44 mg/dL (ref 15–143)
Total Protein ELP: 5.9 g/dL — ABNORMAL LOW (ref 6.0–8.5)

## 2020-08-27 LAB — KAPPA/LAMBDA LIGHT CHAINS
Kappa free light chain: 9.2 mg/L (ref 3.3–19.4)
Kappa, lambda light chain ratio: 2 — ABNORMAL HIGH (ref 0.26–1.65)
Lambda free light chains: 4.6 mg/L — ABNORMAL LOW (ref 5.7–26.3)

## 2020-09-14 ENCOUNTER — Other Ambulatory Visit: Payer: Self-pay | Admitting: Neurology

## 2020-09-23 ENCOUNTER — Ambulatory Visit: Payer: Medicare Other

## 2020-09-26 ENCOUNTER — Telehealth: Payer: Self-pay | Admitting: Internal Medicine

## 2020-09-26 ENCOUNTER — Other Ambulatory Visit: Payer: Self-pay | Admitting: Internal Medicine

## 2020-09-26 DIAGNOSIS — C9000 Multiple myeloma not having achieved remission: Secondary | ICD-10-CM

## 2020-09-26 MED ORDER — GABAPENTIN 300 MG PO CAPS
ORAL_CAPSULE | ORAL | 3 refills | Status: DC
Start: 2020-09-26 — End: 2021-01-23

## 2020-09-26 MED ORDER — OXYCODONE HCL 5 MG PO TABS: 5.0000 mg | ORAL_TABLET | Freq: Two times a day (BID) | ORAL | 0 refills | Status: DC | PRN

## 2020-09-26 NOTE — Telephone Encounter (Signed)
Ok Thx 

## 2020-09-26 NOTE — Telephone Encounter (Signed)
    1.Medication Requested:oxyCODONE (OXY IR/ROXICODONE) 5 MG immediate release tablet  2. Pharmacy (Name, Street, City):[id number] - [place], [place] - [address] [place] [address]  3. On Med List:yes  4. Last Visit with PCP: 08/20/20  5. Next visit date with PCP: n/a   Agent: Please be advised that RX refills may take up to 3 business days. We ask that you follow-up with your pharmacy.

## 2020-09-26 NOTE — Telephone Encounter (Signed)
°  gabapentin (NEURONTIN) 300 MG capsule Requesting this medication as well

## 2020-11-05 ENCOUNTER — Telehealth: Payer: Self-pay | Admitting: Hematology

## 2020-11-05 NOTE — Telephone Encounter (Signed)
Rescheduled 03/08 appointment times due to LAB meeting, called patient regarding upcoming schedule change. Patient is notified.

## 2020-11-17 NOTE — Progress Notes (Signed)
HEMATOLOGY/ONCOLOGY CLINIC NOTE  Date of Service: 11/17/2020  Patient Care Team: Cassandria Anger, MD as PCP - General Larey Dresser, MD as PCP - Advanced Heart Failure (Cardiology) Larey Dresser, MD as PCP - Cardiology (Cardiology) Erline Levine, MD as Attending Physician (Neurosurgery) Jarome Matin, MD as Consulting Physician (Dermatology) Rigoberto Noel, MD as Consulting Physician (Pulmonary Disease) Alda Berthold, DO as Consulting Physician (Neurology)   CHIEF COMPLAINTS/PURPOSE OF CONSULTATION:   Plasma cell myeloma   HISTORY OF PRESENTING ILLNESS:  [name] [name] is a wonderful 80 y.o. male who has been referred to [place] by Angelena Form, PA for evaluation and management of lytic bone lesions. The pt reports that he is doing well overall.  The pt reports that he has occasional hip pain that radiates down his legs and prevents him from walking. He uses Advil, which helps his hip pain. He is used to exercising often, but has not been able to stay very physically active lately so he is gaining weight. The pt experiences some SOB when he wakes up in the morning.  The pt had a pre-procedural CTA C/A/P before a TAVR completed on 03/14/2019 which revealed "indeterminate osseus lesions in the bony pelvis," which led to an MRI and PET scan. He reports that he has pain when he pushes on his right chest.   He also notes that he had a fall while exercising in 06/2018 and thought he broke his back. He received a blood transfusion on 07/24/2018.   Of note prior to the patient's visit today, the pt has had a MRI pelvis w/wo contrast completed on 03/28/2019 with results revealing "1. Destructive bone lesions as detailed above. Findings most consistent with metastatic disease. PET-CT may be helpful for further evaluation and to establish a primary tumor. The right pelvic bone lesions should be amenable to image guided biopsy but a PET scan may demonstrate easier/safer biopsy sites. 2.  No intrapelvic mass or adenopathy. 3. Benign intraosseous lipoma involving the left anterior superior acetabulum."  The pt has also had PET whole body completed on 04/06/2019 with results revealing "1. Diffuse osseous metastatic disease as detailed above without findings for a primary neoplasm in the chest, abdomen or pelvis. The large destructive lesion involving the right ischium should be amenable to image guided biopsy. 2. Two small retroperitoneal lymph nodes and 1 small right obturator node showing hypermetabolism."   Most recent lab results (04/06/2019) of CBC is as follows: all values are WNL.  On review of systems, pt reports hip and leg pain, weight gain and denies syncope and denies belly pain, recent neuropathy and any other symptoms.   On PMHx the pt reports 5 cm hepatic flexure polyp removal, pneumonia, blood transfusion on 07/24/2018  On Social Hx the pt reports that he lives at home with his wife. He is from [place].    INTERVAL HISTORY: [name] [name] is a wonderful 80 y.o. male who has who is here today for evaluation and management of his Plasma Cell Myeloma. Pt is accompanied by his wife. The patient's last visit with [place] was on 08/26/2020. The pt reports that he is doing well overall.  The pt reports that his leg has been much worse lately. He has tried the PT and Acupuncture, but is still in very much pain. He denies it feeling tingling, but is very painful from the ankle to the toes. He notes that it is a burning pain. The right foot is worse than the left.  This has caused him to use more of his pain medication, from once daily to BID. The pt notes that he did not feel any different in the past when he used a patch for pain. The pt notes he is still taking the same Gabapentin dosage. The pt also notes he now has neuropathy and burning sensation in his hands. The pt denies any recent falls. The pt notes no major medication changes.   Lab results today 11/18/2020 of CBC w/diff  and CMP is as follows: all values are WNL except for Plt of 138K, Glucose of 123, Total Protein of 6.1. 11/18/2020 MMP - M spike -- not detected 11/18/2020 K/L ratio wnl  On review of systems, pt reports neuropathy in hands/feet and denies abdominal pain, leg swelling, back pain, SOB,  and any other symptoms.  MEDICAL HISTORY:  Past Medical History:  Diagnosis Date  . Ascending aortic aneurysm ([place])   . Bicuspid aortic valve   . Cancer ([place])   . CHF NYHA class I (no symptoms from ordinary activities), acute, diastolic ([place])   . Dysrhythmia 2009   A fib  . Fatty liver    mild  . Fracture    left proximal humerus  . GERD (gastroesophageal reflux disease)   . GI bleeding 07/21/2018   post polypectomy  . Hemorrhoids   . HTN (hypertension)   . Hypercholesteremia   . Hypokalemia   . Internal hemorrhoids   . LBP (low back pain)   . Moderate aortic stenosis   . Osteoarthritis   . Paroxysmal atrial fibrillation ([place])    a. new onset Afib in 07/2008. He underwent ibutilide cardioversion successfully. b. Recurrence 01/2013 s/p TEE/DCCV - was on Xarelto but he stopped it as he was convinced it was causing joint pn. c. Recurrence 01/2016 - spont conv to NSR. Pt took Eliquis x1 mo then declined further anticoag. d. Recurrence 07/2016.  [name] Pneumonia   . Tubular adenoma of colon     SURGICAL HISTORY: Past Surgical History:  Procedure Laterality Date  . BACK SURGERY  x12 years ago  . CARDIAC CATHETERIZATION  2020  . CARDIAC VALVE REPLACEMENT  2020  . CARDIOVERSION N/A 01/26/2013   Procedure: CARDIOVERSION;  Surgeon: Larey Dresser, MD;  Location: [facility] ENDOSCOPY;  Service: Cardiovascular;  Laterality: N/A;  . CARDIOVERSION N/A 10/28/2017   Procedure: CARDIOVERSION;  Surgeon: Larey Dresser, MD;  Location: [facility] ENDOSCOPY;  Service: Cardiovascular;  Laterality: N/A;  . CARDIOVERSION N/A 03/03/2018   Procedure: CARDIOVERSION;  Surgeon: Lelon Perla, MD;  Location: [facility] ENDOSCOPY;  Service:  Cardiovascular;  Laterality: N/A;  . CARDIOVERSION N/A 09/19/2019   Procedure: CARDIOVERSION;  Surgeon: Larey Dresser, MD;  Location: [facility] ENDOSCOPY;  Service: Cardiovascular;  Laterality: N/A;  . COLONOSCOPY    . COLONOSCOPY  07/17/2018   at [facility]  . HEMORRHOID SURGERY    . INGUINAL HERNIA REPAIR Left 03/18/2020   Procedure: OPEN LEFT INGUINAL HERNIA REPAIR;  Surgeon: Alphonsa Overall, MD;  Location: [place];  Service: General;  Laterality: Left;  . LUMBAR LAMINECTOMY    . ORIF HUMERUS FRACTURE Left 01/15/2020   Procedure: OPEN REDUCTION INTERNAL FIXATION (ORIF) PROXIMAL HUMERUS FRACTURE;  Surgeon: Nicholes Stairs, MD;  Location: [place];  Service: Orthopedics;  Laterality: Left;  . POLYPECTOMY    . RIGHT HEART CATH N/A 08/20/2019   Procedure: RIGHT HEART CATH;  Surgeon: Larey Dresser, MD;  Location: [place] CV LAB;  Service: Cardiovascular;  Laterality: N/A;  . RIGHT/LEFT  HEART CATH AND CORONARY ANGIOGRAPHY N/A 03/07/2019   Procedure: RIGHT/LEFT HEART CATH AND CORONARY ANGIOGRAPHY;  Surgeon: Burnell Blanks, MD;  Location: [place] CV LAB;  Service: Cardiovascular;  Laterality: N/A;  . [name]  04/2019  . TEE WITHOUT CARDIOVERSION N/A 01/26/2013   Procedure: TRANSESOPHAGEAL ECHOCARDIOGRAM (TEE);  Surgeon: Larey Dresser, MD;  Location: [place];  Service: Cardiovascular;  Laterality: N/A;  . TEE WITHOUT CARDIOVERSION N/A 10/28/2017   Procedure: TRANSESOPHAGEAL ECHOCARDIOGRAM (TEE);  Surgeon: Larey Dresser, MD;  Location: [facility] ENDOSCOPY;  Service: Cardiovascular;  Laterality: N/A;  . TEE WITHOUT CARDIOVERSION N/A 05/08/2019   Procedure: TRANSESOPHAGEAL ECHOCARDIOGRAM (TEE);  Surgeon: Burnell Blanks, MD;  Location: [place] CV LAB;  Service: Open Heart Surgery;  Laterality: N/A;  . TEE WITHOUT CARDIOVERSION N/A 09/19/2019   Procedure: TRANSESOPHAGEAL ECHOCARDIOGRAM (TEE);  Surgeon: Larey Dresser, MD;  Location: [facility] ENDOSCOPY;  Service: Cardiovascular;  Laterality: N/A;   . TRANSCATHETER AORTIC VALVE REPLACEMENT, TRANSFEMORAL N/A 05/08/2019   Procedure: TRANSCATHETER AORTIC VALVE REPLACEMENT, TRANSFEMORAL;  Surgeon: Burnell Blanks, MD;  Location: [place] CV LAB;  Service: Open Heart Surgery;  Laterality: N/A;    SOCIAL HISTORY: Social History   Socioeconomic History  . Marital status: Married    Spouse name: Not on file  . Number of children: 0  . Years of education: Not on file  . Highest education level: Not on file  Occupational History  . Occupation: Retired Lobbyist: [employer]  Tobacco Use  . Smoking status: Never Smoker  . Smokeless tobacco: Never Used  Vaping Use  . Vaping Use: Never used  Substance and Sexual Activity  . Alcohol use: Not Currently  . Drug use: No  . Sexual activity: Yes  Other Topics Concern  . Not on file  Social History Narrative   Patient lives in [place] w/ his wife. He is a native of [place]. 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   Right-handed   Caffeine: 2 cups coffee per day   Two story home   Social Determinants of Health   Financial Resource Strain: Not on file  Food Insecurity: Not on file  Transportation Needs: Not on file  Physical Activity: Not on file  Stress: Not on file  Social Connections: Not on file  Intimate Partner Violence: Not on file    FAMILY HISTORY: Family History  Problem Relation Age of Onset  . Colon cancer Mother 47  . Hypertension Other   . Coronary artery disease Neg Hx   . Colon polyps Neg Hx   . Esophageal cancer Neg Hx   . Rectal cancer Neg Hx   . Stomach cancer Neg Hx     ALLERGIES:  is allergic to xarelto [rivaroxaban], ambien [zolpidem], corticosteroids, ramipril, and benazepril.  MEDICATIONS:  Current Outpatient Medications  Medication Sig Dispense Refill  . apixaban (ELIQUIS) 5 MG TABS tablet Take 5 mg by mouth 2 (two) times daily.    [name] b complex vitamins tablet Take 1 tablet by  mouth daily. 100 tablet 3  . Calcium-Magnesium 500-250 MG TABS Take 1 tablet by mouth daily.    . Carboxymethylcellul-Glycerin (LUBRICATING EYE DROPS OP) Place 1 drop into both eyes daily as needed (dry eyes).    . Cholecalciferol (VITAMIN D) 50 MCG (2000 UT) tablet Take 2,000 Units by mouth daily.    . diclofenac Sodium (VOLTAREN) 1 % GEL Apply 1 application topically 2 (two)  times daily as needed (pain.).    [name] DULoxetine (CYMBALTA) 60 MG capsule TAKE 1 CAPSULE BY MOUTH EVERY DAY 90 capsule 1  . famotidine (PEPCID) 40 MG tablet Take 40 mg by mouth daily.    [name] gabapentin (NEURONTIN) 300 MG capsule TAKE 1 CAPSULE IN THE MORNING AND 2 CAPSULES AT BEDTIME. 270 capsule 3  . LORazepam (ATIVAN) 0.5 MG tablet TAKE 1 TABLET (0.5 MG TOTAL) BY MOUTH EVERY 8 (EIGHT) HOURS AS NEEDED (NAUSEA OR VOMITING). 60 tablet 1  . metoprolol succinate (TOPROL-XL) 25 MG 24 hr tablet Take 25 mg by mouth 2 (two) times daily.    [name] oxyCODONE (OXY IR/ROXICODONE) 5 MG immediate release tablet Take 1-2 tablets (5-10 mg total) by mouth 2 (two) times daily as needed for severe pain. 60 tablet 0  . polyethylene glycol powder (GLYCOLAX/MIRALAX) 17 GM/SCOOP powder Take 17-34 g by mouth 2 (two) times daily as needed for moderate constipation. 500 g 5  . senna-docusate (SENOKOT-S) 8.6-50 MG tablet Take 2 tablets by mouth at bedtime.    [name] spironolactone (ALDACTONE) 25 MG tablet Take 1 tablet (25 mg total) by mouth daily. 30 tablet 3  . spironolactone (ALDACTONE) 25 MG tablet Take 1 tablet (25 mg total) by mouth daily. 90 tablet 3  . spironolactone (ALDACTONE) 25 MG tablet Take by mouth.    . Vitamin D, Ergocalciferol, (DRISDOL) 1.25 MG (50000 UNIT) CAPS capsule TAKE 1 CAPSULE BY MOUTH ONE TIME PER WEEK 12 capsule 3   No current facility-administered medications for this visit.    REVIEW OF SYSTEMS:   10 Point review of Systems was done is negative except as noted above.   PHYSICAL EXAMINATION: Vitals:   11/18/20 0947  BP: 117/66   Pulse: (!) 59  Resp: 17  Temp: 97.8 F (36.6 C)  SpO2: 99%   Wt Readings from Last 3 Encounters:  08/26/20 179 lb 4.8 oz (81.3 kg)  08/20/20 180 lb 9.6 oz (81.9 kg)  06/03/20 175 lb 1.6 oz (79.4 kg)   Body mass index is 24.64 kg/m.    ECOG FS:2 - Symptomatic, <50% confined to bed  Exam was given in a chair.  GENERAL:alert, in no acute distress and comfortable SKIN: no acute rashes, no significant lesions EYES: conjunctiva are pink and non-injected, sclera anicteric OROPHARYNX: MMM, no exudates, no oropharyngeal erythema or ulceration NECK: supple, no JVD LYMPH:  no palpable lymphadenopathy in the cervical, axillary or inguinal regions LUNGS: clear to auscultation b/l with normal respiratory effort HEART: regular rate & rhythm ABDOMEN:  normoactive bowel sounds , non tender, not distended. Extremity: no pedal edema PSYCH: alert & oriented x 3 with fluent speech NEURO: no focal motor/sensory deficits   LABORATORY DATA:  I have reviewed the data as listed  . CBC Latest Ref Rng & Units 11/18/2020 08/26/2020 06/03/2020  WBC 4.0 - 10.5 K/uL 6.1 6.5 8.9  Hemoglobin 13.0 - 17.0 g/dL 13.0 13.8 12.8(L)  Hematocrit 39.0 - 52.0 % 40.4 42.3 39.4  Platelets 150 - 400 K/uL 138(L) 159 136(L)    . CMP Latest Ref Rng & Units 11/18/2020 08/26/2020 06/23/2020  Glucose 70 - 99 mg/dL 123(H) 98 -  BUN 8 - 23 mg/dL 19 16 -  Creatinine 0.61 - 1.24 mg/dL 1.05 1.30(H) -  Sodium 135 - 145 mmol/L 142 144 -  Potassium 3.5 - 5.1 mmol/L 3.7 4.5 -  Chloride 98 - 111 mmol/L 109 109 -  CO2 22 - 32 mmol/L 25 26 -  Calcium 8.9 -  10.3 mg/dL 8.9 9.8 -  Total Protein 6.5 - 8.1 g/dL 6.1(L) 6.5 5.7(L)  Total Bilirubin 0.3 - 1.2 mg/dL 0.3 0.7 0.9  Alkaline Phos 38 - 126 U/L 57 50 54  AST 15 - 41 U/L _0 ALT 0 - 44 U/L 20 20 39   Component     Latest Ref Rng & Units 04/30/2019  Total Protein, Urine-UPE24     Not Estab. mg/dL   Total Protein, Urine-Ur/day     30 - 150 mg/24 hr   ALBUMIN, U      %   ALPHA 1 URINE     %   Alpha 2, Urine     %   % BETA, Urine     %   GAMMA GLOBULIN URINE     %   Free Kappa Lt Chains,Ur     0.63 - 113.79 mg/L   Free Lambda Lt Chains,Ur     0.47 - 11.77 mg/L   Free Kappa/Lambda Ratio     1.03 - 31.76   Immunofixation Result, Urine        Total Volume        M-SPIKE %, Urine     Not Observed %   M-Spike, mg/24 hr     Not Observed mg/24 hr   NOTE:        IgG (Immunoglobin G), Serum     603 - 1,613 mg/dL 1,740 (H)  IgA     61 - 437 mg/dL 16 (L)  IgM (Immunoglobulin M), Srm     15 - 143 mg/dL 9 (L)  Total Protein ELP     6.0 - 8.5 g/dL 7.3  Albumin SerPl Elph-Mcnc     2.9 - 4.4 g/dL 3.8  Alpha 1     0.0 - 0.4 g/dL 0.3  Alpha2 Glob SerPl Elph-Mcnc     0.4 - 1.0 g/dL 0.7  B-Globulin SerPl Elph-Mcnc     0.7 - 1.3 g/dL 0.9  Gamma Glob SerPl Elph-Mcnc     0.4 - 1.8 g/dL 1.6  M Protein SerPl Elph-Mcnc     Not Observed g/dL 1.5 (H)  Globulin, Total     2.2 - 3.9 g/dL 3.5  Albumin/Glob SerPl     0.7 - 1.7 1.1  IFE 1      Comment  Please Note (HCV):      Comment  Kappa free light chain     3.3 - 19.4 mg/L 821.2 (H)  Lamda free light chains     5.7 - 26.3 mg/L 1.9 (L)  Kappa, lamda light chain ratio     0.26 - 1.65 432.21 (H)  LDH     98 - 192 U/L 132  Sed Rate     0 - 16 mm/hr 3      05/24/2019 Bone Marrow Biopsy    04/16/2019 Surgical Pathology:   RADIOGRAPHIC STUDIES: I have personally reviewed the radiological images as listed and agreed with the findings in the report. No results found.  ASSESSMENT & PLAN:   80 yo   #1 Recently diagnosed Plasma cell myeloma  03/28/2019 MRI pelvis w/wo contrast revealed "1. Destructive bone lesions as detailed above. Findings most consistent with metastatic disease. PET-CT may be helpful for further evaluation and to establish a primary tumor. The right pelvic bone lesions should be amenable to image guided biopsy but a PET scan may demonstrate easier/safer biopsy sites.  2. No intrapelvic mass or adenopathy. 3. Benign intraosseous lipoma involving  the left anterior superior acetabulum."  04/06/2019 PET whole body revealed "1. Diffuse osseous metastatic disease as detailed above without findings for a primary neoplasm in the chest, abdomen or pelvis. The large destructive lesion involving the right ischium should be amenable to image guided biopsy. 2. Two small retroperitoneal lymph nodes and 1 small right obturator node showing hypermetabolism."   04/16/2019 Posterior right pelvis bone biopsy revealed "PLASMA CELL NEOPLASM"  05/24/2019 Bone Marrow Biopsy revealed "BONE MARROW: - CELLULAR MARROW WITH INVOLVEMENT BY PLASMA CELL NEOPLASM (20%) PERIPHERAL BLOOD: - MORPHOLOGICALLY UNREMARKABLE"  05/24/2019 FISH Panel revealed "ABNORMAL result with 11q+, 14q+ and +17"  #2 Severe aortic stenosis with bicuspid aortic valve -10/26/2018 ECHO revealed AVA at 0.8 cm2 and LV EF of 60-65% -05/08/2019 pt had a Transfemoral Transcatheter Aortic Valve Replacement   PLAN: -Discussed pt labwork today, 11/18/2020; blood counts normal, total protein improved, kidney numbers improved.  -Advised pt that neuropathy is due to combination of chemotherapy and pinched nerves in back.  -Advised pt we can increase Gabapentin dosage or try adding in a Fentanyl patch for neuropathy in feet. The pt wishes to add the Fentanyl patch, as Gabapentin makes him sleepy. -Advised pt we can use Lyrica instead of Gabapentin as we can adjust dosage easier. -Discussed electrical device in back that blocks pain from foot. Advised pt these can be found online. (TENS) -Advised pt it would be okay to use CBD oil for massaging on feet to alleviate pain. -Advised pt that his last labs showed he was in remission. Will continue to monitor with labs today. -Based on labs, symptoms, and clinical exam pt continues to be in remission from Multiple Myeloma at this time. -No indication to restart treatment. Will  continue watchful observation.  -Recommended pt stay physically active.  -Continue Gabapentin.  -Continue Oxycodone prn. -Continue Zometa q76month -Will see back in 3 months with labs.   -Rx Fentanyl patch. Refill Oxycodone.  FOLLOW UP: RTC with Dr KIrene Limbowith labs and next Zometa infusion in 3 months   The total time spent in the appointment was 30 minutes and more than 50% was on counseling and direct patient cares.   All of the patient's questions were answered with apparent satisfaction. The patient knows to call the clinic with any problems, questions or concerns.   GSullivan LoneMD MMelroseAAHIVMS SBloomington Surgery CenterCAlliancehealth MadillHematology/Oncology Physician CHudson [facility] (Office):       [phone](Work cell):  [phone](Fax):           [phone] 11/17/2020 2:01 PM  I, RReinaldo Raddle am acting as scribe for Dr. GSullivan Lone MD.    .I have reviewed the above documentation for accuracy and completeness, and I agree with the above. .Brunetta GeneraMD

## 2020-11-18 ENCOUNTER — Other Ambulatory Visit: Payer: Self-pay

## 2020-11-18 ENCOUNTER — Inpatient Hospital Stay (HOSPITAL_BASED_OUTPATIENT_CLINIC_OR_DEPARTMENT_OTHER): Payer: Medicare Other | Admitting: Hematology

## 2020-11-18 ENCOUNTER — Ambulatory Visit: Payer: Medicare Other

## 2020-11-18 ENCOUNTER — Inpatient Hospital Stay: Payer: Medicare Other

## 2020-11-18 ENCOUNTER — Ambulatory Visit: Payer: Medicare Other | Admitting: Hematology

## 2020-11-18 ENCOUNTER — Other Ambulatory Visit: Payer: Medicare Other

## 2020-11-18 ENCOUNTER — Inpatient Hospital Stay: Payer: Medicare Other | Attending: Hematology

## 2020-11-18 DIAGNOSIS — Z79899 Other long term (current) drug therapy: Secondary | ICD-10-CM | POA: Diagnosis not present

## 2020-11-18 DIAGNOSIS — M899 Disorder of bone, unspecified: Secondary | ICD-10-CM | POA: Insufficient documentation

## 2020-11-18 DIAGNOSIS — M25559 Pain in unspecified hip: Secondary | ICD-10-CM | POA: Diagnosis not present

## 2020-11-18 DIAGNOSIS — C9000 Multiple myeloma not having achieved remission: Secondary | ICD-10-CM | POA: Diagnosis not present

## 2020-11-18 DIAGNOSIS — Z9181 History of falling: Secondary | ICD-10-CM | POA: Insufficient documentation

## 2020-11-18 DIAGNOSIS — Z7189 Other specified counseling: Secondary | ICD-10-CM

## 2020-11-18 DIAGNOSIS — C7951 Secondary malignant neoplasm of bone: Secondary | ICD-10-CM

## 2020-11-18 LAB — CBC WITH DIFFERENTIAL/PLATELET
Abs Immature Granulocytes: 0.02 10*3/uL (ref 0.00–0.07)
Basophils Absolute: 0 10*3/uL (ref 0.0–0.1)
Basophils Relative: 1 %
Eosinophils Absolute: 0.5 10*3/uL (ref 0.0–0.5)
Eosinophils Relative: 8 %
HCT: 40.4 % (ref 39.0–52.0)
Hemoglobin: 13 g/dL (ref 13.0–17.0)
Immature Granulocytes: 0 %
Lymphocytes Relative: 19 %
Lymphs Abs: 1.2 10*3/uL (ref 0.7–4.0)
MCH: 28.6 pg (ref 26.0–34.0)
MCHC: 32.2 g/dL (ref 30.0–36.0)
MCV: 89 fL (ref 80.0–100.0)
Monocytes Absolute: 0.5 10*3/uL (ref 0.1–1.0)
Monocytes Relative: 9 %
Neutro Abs: 3.8 10*3/uL (ref 1.7–7.7)
Neutrophils Relative %: 63 %
Platelets: 138 10*3/uL — ABNORMAL LOW (ref 150–400)
RBC: 4.54 MIL/uL (ref 4.22–5.81)
RDW: 13 % (ref 11.5–15.5)
WBC: 6.1 10*3/uL (ref 4.0–10.5)
nRBC: 0 % (ref 0.0–0.2)

## 2020-11-18 LAB — CMP (CANCER CENTER ONLY)
ALT: 20 U/L (ref 0–44)
AST: 21 U/L (ref 15–41)
Albumin: 4.2 g/dL (ref 3.5–5.0)
Alkaline Phosphatase: 57 U/L (ref 38–126)
Anion gap: 8 (ref 5–15)
BUN: 19 mg/dL (ref 8–23)
CO2: 25 mmol/L (ref 22–32)
Calcium: 8.9 mg/dL (ref 8.9–10.3)
Chloride: 109 mmol/L (ref 98–111)
Creatinine: 1.05 mg/dL (ref 0.61–1.24)
GFR, Estimated: >60 mL/min (ref >60)
Glucose, Bld: 123 mg/dL — ABNORMAL HIGH (ref 70–99)
Potassium: 3.7 mmol/L (ref 3.5–5.1)
Sodium: 142 mmol/L (ref 135–145)
Total Bilirubin: 0.3 mg/dL (ref 0.3–1.2)
Total Protein: 6.1 g/dL — ABNORMAL LOW (ref 6.5–8.1)

## 2020-11-18 MED ORDER — SODIUM CHLORIDE 0.9 % IV SOLN
Freq: Once | INTRAVENOUS | Status: AC
  Filled 2020-11-18: qty 250

## 2020-11-18 MED ORDER — OXYCODONE HCL 5 MG PO TABS: 5.0000 mg | ORAL_TABLET | Freq: Two times a day (BID) | ORAL | 0 refills | Status: DC | PRN

## 2020-11-18 MED ORDER — ZOLEDRONIC ACID 4 MG/100ML IV SOLN
INTRAVENOUS | Status: AC
  Filled 2020-11-18: qty 100

## 2020-11-18 MED ORDER — FENTANYL 12 MCG/HR TD PT72: 1.0000 | MEDICATED_PATCH | TRANSDERMAL | 0 refills | Status: DC

## 2020-11-18 MED ORDER — ZOLEDRONIC ACID 4 MG/100ML IV SOLN
4.0000 mg | Freq: Once | INTRAVENOUS | Status: AC
  Administered 2020-11-18: 4 mg via INTRAVENOUS

## 2020-11-18 NOTE — Patient Instructions (Signed)
Thank you for choosing [facility] to provide your oncology and hematology care.   Should you have questions after your visit to the [facility] ([facility]), please contact this office at [phone] between 8:30 AM and 4:30 PM.  Voice mails left after 4:00 PM may not be returned until the following business day.  Calls received after 4:30 PM will be answered by an off-site Nurse Triage Line.    Prescription Refills:  Please have your pharmacy contact us directly for most prescription requests.  Contact the office directly for refills of narcotics (pain medications). Allow 48-72 hours for refills.  Appointments: Please contact the [facility] scheduling department [phone] for questions regarding [facility] appointment scheduling.  Contact the schedulers with any scheduling changes so that your appointment can be rescheduled in a timely manner.   Central Scheduling for [place] [phone] - Call to schedule procedures such as PET scans, CT scans, MRI, Ultrasound, etc.  To afford each patient quality time with our providers, please arrive 30 minutes before your scheduled appointment time.  If you arrive late for your appointment, you may be asked to reschedule.  We strive to give you quality time with our providers, and arriving late affects you and other patients whose appointments are after yours. If you are a no show for multiple scheduled visits, you may be dismissed from the clinic at the providers discretion.     Resources: [facility] Social Workers [phone] for additional information on assistance programs or assistance connecting with community support programs   [place] DSS  [phone]: Information regarding food stamps, Medicaid, and utility assistance [place] [place] [phone]   [place] [employer]'s shared-ride transportation service for eligible riders who have a disability that prevents them from riding the fixed route bus.   Medicare  Rights Center 800-333-4114 Helps people with Medicare understand their rights and benefits, navigate the Medicare system, and secure the quality healthcare they deserve [employer] 800-227-2345 Assists patients locate various types of support and financial assistance Cancer Care: 1-800-813-HOPE (4673) Provides financial assistance, online support groups, medication/co-pay assistance.   Transportation Assistance for appointments at [facility]: Transportation Coordinator [phone]  Again, thank you for choosing [facility] for your care.       

## 2020-11-18 NOTE — Patient Instructions (Signed)
Zoledronic Acid Injection (Hypercalcemia, Oncology) What is this medicine? ZOLEDRONIC ACID (ZOE le dron ik AS id) slows calcium loss from bones. It high calcium levels in the blood from some kinds of cancer. It may be used in other people at risk for bone loss. This medicine may be used for other purposes; ask your health care provider or pharmacist if you have questions. COMMON BRAND NAME(S): Zometa What should I tell my health care provider before I take this medicine? They need to know if you have any of these conditions:  cancer  dehydration  dental disease  kidney disease  liver disease  low levels of calcium in the blood  lung or breathing disease (asthma)  receiving steroids like dexamethasone or prednisone  an unusual or allergic reaction to zoledronic acid, other medicines, foods, dyes, or preservatives  pregnant or trying to get pregnant  breast-feeding How should I use this medicine? This drug is injected into a vein. It is given by a health care provider in a hospital or clinic setting. Talk to your health care provider about the use of this drug in children. Special care may be needed. Overdosage: If you think you have taken too much of this medicine contact a poison control center or emergency room at once. NOTE: This medicine is only for you. Do not share this medicine with others. What if I miss a dose? Keep appointments for follow-up doses. It is important not to miss your dose. Call your health care provider if you are unable to keep an appointment. What may interact with this medicine?  certain antibiotics given by injection  NSAIDs, medicines for pain and inflammation, like ibuprofen or naproxen  some diuretics like bumetanide, furosemide  teriparatide  thalidomide This list may not describe all possible interactions. Give your health care provider a list of all the medicines, herbs, non-prescription drugs, or dietary supplements you use. Also tell  them if you smoke, drink alcohol, or use illegal drugs. Some items may interact with your medicine. What should I watch for while using this medicine? Visit your health care provider for regular checks on your progress. It may be some time before you see the benefit from this drug. Some people who take this drug have severe bone, joint, or muscle pain. This drug may also increase your risk for jaw problems or a broken thigh bone. Tell your health care provider right away if you have severe pain in your jaw, bones, joints, or muscles. Tell you health care provider if you have any pain that does not go away or that gets worse. Tell your dentist and dental surgeon that you are taking this drug. You should not have major dental surgery while on this drug. See your dentist to have a dental exam and fix any dental problems before starting this drug. Take good care of your teeth while on this drug. Make sure you see your dentist for regular follow-up appointments. You should make sure you get enough calcium and vitamin D while you are taking this drug. Discuss the foods you eat and the vitamins you take with your health care provider. Check with your health care provider if you have severe diarrhea, nausea, and vomiting, or if you sweat a lot. The loss of too much body fluid may make it dangerous for you to take this drug. You may need blood work done while you are taking this drug. Do not become pregnant while taking this drug. Women should inform their health care provider   if they wish to become pregnant or think they might be pregnant. There is potential for serious harm to an unborn child. Talk to your health care provider for more information. What side effects may I notice from receiving this medicine? Side effects that you should report to your doctor or health care provider as soon as possible:  allergic reactions (skin rash, itching or hives; swelling of the face, lips, or tongue)  bone  pain  infection (fever, chills, cough, sore throat, pain or trouble passing urine)  jaw pain, especially after dental work  joint pain  kidney injury (trouble passing urine or change in the amount of urine)  low blood pressure (dizziness; feeling faint or lightheaded, falls; unusually weak or tired)  low calcium levels (fast heartbeat; muscle cramps or pain; pain, tingling, or numbness in the hands or feet; seizures)  low magnesium levels (fast, irregular heartbeat; muscle cramp or pain; muscle weakness; tremors; seizures)  low red blood cell counts (trouble breathing; feeling faint; lightheaded, falls; unusually weak or tired)  muscle pain  redness, blistering, peeling, or loosening of the skin, including inside the mouth  severe diarrhea  swelling of the ankles, feet, hands  trouble breathing Side effects that usually do not require medical attention (report to your doctor or health care provider if they continue or are bothersome):  anxious  constipation  coughing  depressed mood  eye irritation, itching, or pain  fever  general ill feeling or flu-like symptoms  nausea  pain, redness, or irritation at site where injected  trouble sleeping This list may not describe all possible side effects. Call your doctor for medical advice about side effects. You may report side effects to FDA at 1-800-FDA-1088. Where should I keep my medicine? This drug is given in a hospital or clinic. It will not be stored at home. NOTE: This sheet is a summary. It may not cover all possible information. If you have questions about this medicine, talk to your doctor, pharmacist, or health care provider.  2021 Elsevier/Gold Standard (2019-06-14 09:13:00)  

## 2020-11-19 LAB — KAPPA/LAMBDA LIGHT CHAINS
Kappa free light chain: 8.5 mg/L (ref 3.3–19.4)
Kappa, lambda light chain ratio: 1.7 — ABNORMAL HIGH (ref 0.26–1.65)
Lambda free light chains: 5 mg/L — ABNORMAL LOW (ref 5.7–26.3)

## 2020-11-21 LAB — MULTIPLE MYELOMA PANEL, SERUM
Albumin SerPl Elph-Mcnc: 3.9 g/dL (ref 2.9–4.4)
Albumin/Glob SerPl: 2 — ABNORMAL HIGH (ref 0.7–1.7)
Alpha 1: 0.2 g/dL (ref 0.0–0.4)
Alpha2 Glob SerPl Elph-Mcnc: 0.5 g/dL (ref 0.4–1.0)
B-Globulin SerPl Elph-Mcnc: 0.8 g/dL (ref 0.7–1.3)
Gamma Glob SerPl Elph-Mcnc: 0.4 g/dL (ref 0.4–1.8)
Globulin, Total: 2 g/dL — ABNORMAL LOW (ref 2.2–3.9)
IgA: 61 mg/dL (ref 61–437)
IgG (Immunoglobin G), Serum: 377 mg/dL — ABNORMAL LOW (ref 603–1613)
IgM (Immunoglobulin M), Srm: 49 mg/dL (ref 15–143)
Total Protein ELP: 5.9 g/dL — ABNORMAL LOW (ref 6.0–8.5)

## 2020-12-10 ENCOUNTER — Other Ambulatory Visit: Payer: Self-pay

## 2020-12-10 ENCOUNTER — Ambulatory Visit (INDEPENDENT_AMBULATORY_CARE_PROVIDER_SITE_OTHER): Payer: Medicare Other | Admitting: Internal Medicine

## 2020-12-10 ENCOUNTER — Encounter: Payer: Self-pay | Admitting: Internal Medicine

## 2020-12-10 DIAGNOSIS — R609 Edema, unspecified: Secondary | ICD-10-CM | POA: Diagnosis not present

## 2020-12-10 DIAGNOSIS — C9000 Multiple myeloma not having achieved remission: Secondary | ICD-10-CM | POA: Diagnosis not present

## 2020-12-10 DIAGNOSIS — R269 Unspecified abnormalities of gait and mobility: Secondary | ICD-10-CM

## 2020-12-10 DIAGNOSIS — I5033 Acute on chronic diastolic (congestive) heart failure: Secondary | ICD-10-CM | POA: Diagnosis not present

## 2020-12-10 DIAGNOSIS — F418 Other specified anxiety disorders: Secondary | ICD-10-CM

## 2020-12-10 DIAGNOSIS — I700 Atherosclerosis of aorta: Secondary | ICD-10-CM

## 2020-12-10 DIAGNOSIS — I480 Paroxysmal atrial fibrillation: Secondary | ICD-10-CM

## 2020-12-10 DIAGNOSIS — Z7189 Other specified counseling: Secondary | ICD-10-CM | POA: Diagnosis not present

## 2020-12-10 MED ORDER — LORAZEPAM 0.5 MG PO TABS: 0.5000 mg | ORAL_TABLET | Freq: Three times a day (TID) | ORAL | 2 refills | Status: DC | PRN

## 2020-12-10 NOTE — Assessment & Plan Note (Signed)
Using a cane 

## 2020-12-10 NOTE — Assessment & Plan Note (Addendum)
F/u Dr. Irene Limbo  Off Decadron

## 2020-12-10 NOTE — Assessment & Plan Note (Signed)
Resolved off Amlodipine

## 2020-12-10 NOTE — Patient Instructions (Signed)
Take Oxycodone in am Take Gabapentin at bedtime

## 2020-12-10 NOTE — Assessment & Plan Note (Signed)
Ok to taper off

## 2020-12-10 NOTE — Assessment & Plan Note (Signed)
Toprol, spironolactone

## 2020-12-10 NOTE — Progress Notes (Signed)
Subjective:  Patient ID: [name] [name], male    DOB: 05-24-1941  Age: 80 y.o. MRN: [id number]  CC: Follow-up (3 month f/u)   HPI Paxton Tencza presents for MM, weakness, neuropathy pains.  He is here with his wife  Outpatient Medications Prior to Visit  Medication Sig Dispense Refill  . apixaban (ELIQUIS) 5 MG TABS tablet Take 5 mg by mouth 2 (two) times daily.    [name] b complex vitamins tablet Take 1 tablet by mouth daily. 100 tablet 3  . Calcium-Magnesium 500-250 MG TABS Take 1 tablet by mouth daily.    . Carboxymethylcellul-Glycerin (LUBRICATING EYE DROPS OP) Place 1 drop into both eyes daily as needed (dry eyes).    . Cholecalciferol (VITAMIN D) 50 MCG (2000 UT) tablet Take 2,000 Units by mouth daily.    . diclofenac Sodium (VOLTAREN) 1 % GEL Apply 1 application topically 2 (two) times daily as needed (pain.).    [name] DULoxetine (CYMBALTA) 60 MG capsule TAKE 1 CAPSULE BY MOUTH EVERY DAY 90 capsule 1  . famotidine (PEPCID) 40 MG tablet Take 40 mg by mouth daily.    . fentaNYL (DURAGESIC) 12 MCG/HR Place 1 patch onto the skin every 3 (three) days. 10 patch 0  . gabapentin (NEURONTIN) 300 MG capsule TAKE 1 CAPSULE IN THE MORNING AND 2 CAPSULES AT BEDTIME. 270 capsule 3  . LORazepam (ATIVAN) 0.5 MG tablet TAKE 1 TABLET (0.5 MG TOTAL) BY MOUTH EVERY 8 (EIGHT) HOURS AS NEEDED (NAUSEA OR VOMITING). 60 tablet 1  . metoprolol succinate (TOPROL-XL) 25 MG 24 hr tablet Take 25 mg by mouth 2 (two) times daily.    [name] oxyCODONE (OXY IR/ROXICODONE) 5 MG immediate release tablet Take 1-2 tablets (5-10 mg total) by mouth 2 (two) times daily as needed for severe pain. 60 tablet 0  . polyethylene glycol powder (GLYCOLAX/MIRALAX) 17 GM/SCOOP powder Take 17-34 g by mouth 2 (two) times daily as needed for moderate constipation. 500 g 5  . senna-docusate (SENOKOT-S) 8.6-50 MG tablet Take 2 tablets by mouth at bedtime.    [name] spironolactone (ALDACTONE) 25 MG tablet Take 1 tablet (25 mg total) by mouth daily. 30 tablet  3  . Vitamin D, Ergocalciferol, (DRISDOL) 1.25 MG (50000 UNIT) CAPS capsule TAKE 1 CAPSULE BY MOUTH ONE TIME PER WEEK 12 capsule 3  . spironolactone (ALDACTONE) 25 MG tablet Take 1 tablet (25 mg total) by mouth daily. 90 tablet 3  . spironolactone (ALDACTONE) 25 MG tablet Take by mouth.     No facility-administered medications prior to visit.    ROS: Review of Systems  Constitutional: Negative for appetite change, fatigue and unexpected weight change.  HENT: Negative for congestion, nosebleeds, sneezing, sore throat and trouble swallowing.   Eyes: Negative for itching and visual disturbance.  Respiratory: Negative for cough.   Cardiovascular: Negative for chest pain, palpitations and leg swelling.  Gastrointestinal: Negative for abdominal distention, blood in stool, diarrhea and nausea.  Genitourinary: Negative for frequency and hematuria.  Musculoskeletal: Positive for arthralgias, back pain and gait problem. Negative for joint swelling and neck pain.  Skin: Negative for rash.  Neurological: Negative for dizziness, tremors, speech difficulty and weakness.  Psychiatric/Behavioral: Negative for agitation, dysphoric mood and sleep disturbance. The patient is not nervous/anxious.     Objective:  BP 122/78 (BP Location: Left Arm)   Pulse 62   Temp 98.2 F (36.8 C) (Oral)   Ht 5\' 11"  (1.803 m)   Wt 174 lb (78.9 kg)   SpO2 97%  BMI 24.27 kg/m   BP Readings from Last 3 Encounters:  12/10/20 122/78  11/18/20 117/66  08/26/20 133/66    Wt Readings from Last 3 Encounters:  12/10/20 174 lb (78.9 kg)  11/18/20 176 lb 11.2 oz (80.2 kg)  08/26/20 179 lb 4.8 oz (81.3 kg)    Physical Exam Constitutional:      General: He is not in acute distress.    Appearance: He is well-developed.     Comments: NAD  Eyes:     Conjunctiva/sclera: Conjunctivae normal.     Pupils: Pupils are equal, round, and reactive to light.  Neck:     Thyroid: No thyromegaly.     Vascular: No JVD.   Cardiovascular:     Rate and Rhythm: Normal rate and regular rhythm.     Heart sounds: Normal heart sounds. No murmur heard. No friction rub. No gallop.   Pulmonary:     Effort: Pulmonary effort is normal. No respiratory distress.     Breath sounds: Normal breath sounds. No wheezing or rales.  Chest:     Chest wall: No tenderness.  Abdominal:     General: Bowel sounds are normal. There is no distension.     Palpations: Abdomen is soft. There is no mass.     Tenderness: There is no abdominal tenderness. There is no guarding or rebound.  Musculoskeletal:        General: Tenderness present. Normal range of motion.     Cervical back: Normal range of motion.     Right lower leg: No edema.     Left lower leg: No edema.  Lymphadenopathy:     Cervical: No cervical adenopathy.  Skin:    General: Skin is warm and dry.     Findings: No rash.  Neurological:     Mental Status: He is alert and oriented to person, place, and time.     Cranial Nerves: No cranial nerve deficit.     Motor: Weakness present. No abnormal muscle tone.     Coordination: Coordination normal.     Gait: Gait abnormal.     Deep Tendon Reflexes: Reflexes are normal and symmetric.  Psychiatric:        Behavior: Behavior normal.        Thought Content: Thought content normal.        Judgment: Judgment normal.    Anxious   Lab Results  Component Value Date   WBC 6.1 11/18/2020   HGB 13.0 11/18/2020   HCT 40.4 11/18/2020   PLT 138 (L) 11/18/2020   GLUCOSE 123 (H) 11/18/2020   CHOL 144 08/01/2018   TRIG 167.0 (H) 08/01/2018   HDL 28.20 (L) 08/01/2018   LDLDIRECT 151.5 11/20/2007   LDLCALC 82 08/01/2018   ALT 20 11/18/2020   AST 21 11/18/2020   NA 142 11/18/2020   K 3.7 11/18/2020   CL 109 11/18/2020   CREATININE 1.05 11/18/2020   BUN 19 11/18/2020   CO2 25 11/18/2020   TSH 5.338 (H) 06/23/2020   PSA 4.20 (H) 08/01/2018   INR 1.2 01/07/2020   HGBA1C 5.8 (H) 05/04/2019    No results  found.  Assessment & Plan:   There are no diagnoses linked to this encounter.   No orders of the defined types were placed in this encounter.    Follow-up: No follow-ups on file.  [name], MD

## 2020-12-18 ENCOUNTER — Telehealth (HOSPITAL_COMMUNITY): Payer: Self-pay | Admitting: *Deleted

## 2020-12-18 MED ORDER — AMIODARONE HCL 200 MG PO TABS: ORAL_TABLET | ORAL | 3 refills | Status: DC

## 2020-12-18 NOTE — Telephone Encounter (Signed)
He can start amiodarone at 200 mg bid x 10 days then 200 mg daily. Have him come by for ECG to confirm rhythm, will need eventual DCCV if in atrial fibrillation.

## 2020-12-18 NOTE — Telephone Encounter (Signed)
Pts wife aware and agreeable with plan.  

## 2020-12-18 NOTE — Telephone Encounter (Signed)
Pts wife called stating pt is in afib. Pts heart rate fluctuating between 54-99. She said pt does not have any symptoms except a little nausea this morning. pts blood pressure 92/64. Wife said pt has amiodarone at home that he does not take but she wanted to know if pt should restart it.   Routed to [place] for advice

## 2020-12-24 ENCOUNTER — Encounter (HOSPITAL_COMMUNITY): Payer: Medicare Other

## 2020-12-28 ENCOUNTER — Other Ambulatory Visit (HOSPITAL_COMMUNITY): Payer: Self-pay | Admitting: Cardiology

## 2020-12-29 DIAGNOSIS — I700 Atherosclerosis of aorta: Secondary | ICD-10-CM | POA: Insufficient documentation

## 2020-12-29 NOTE — Assessment & Plan Note (Signed)
  On diet  

## 2021-01-07 ENCOUNTER — Other Ambulatory Visit: Payer: Self-pay | Admitting: *Deleted

## 2021-01-07 DIAGNOSIS — C9000 Multiple myeloma not having achieved remission: Secondary | ICD-10-CM

## 2021-01-07 NOTE — Telephone Encounter (Signed)
Patient wife called -requested refill of oxycodone for patient. Refill request sent to Dr. Irene Limbo

## 2021-01-08 MED ORDER — OXYCODONE HCL 5 MG PO TABS: 5.0000 mg | ORAL_TABLET | Freq: Two times a day (BID) | ORAL | 0 refills | Status: DC | PRN

## 2021-01-23 ENCOUNTER — Ambulatory Visit (HOSPITAL_COMMUNITY)
Admission: RE | Admit: 2021-01-23 | Discharge: 2021-01-23 | Disposition: A | Discharge status: Home / Self Care | Payer: Medicare Other | Source: Ambulatory Visit | Attending: Cardiology | Admitting: Cardiology

## 2021-01-23 ENCOUNTER — Other Ambulatory Visit (HOSPITAL_COMMUNITY): Payer: Self-pay

## 2021-01-23 ENCOUNTER — Other Ambulatory Visit: Payer: Self-pay

## 2021-01-23 ENCOUNTER — Telehealth (HOSPITAL_COMMUNITY): Payer: Self-pay | Admitting: Pharmacy Technician

## 2021-01-23 ENCOUNTER — Encounter (HOSPITAL_COMMUNITY): Payer: Self-pay | Admitting: Cardiology

## 2021-01-23 VITALS — BP 128/70 | HR 58 | Wt 175.4 lb

## 2021-01-23 DIAGNOSIS — I4510 Unspecified right bundle-branch block: Secondary | ICD-10-CM | POA: Diagnosis not present

## 2021-01-23 DIAGNOSIS — Z79899 Other long term (current) drug therapy: Secondary | ICD-10-CM | POA: Diagnosis not present

## 2021-01-23 DIAGNOSIS — Z952 Presence of prosthetic heart valve: Secondary | ICD-10-CM | POA: Insufficient documentation

## 2021-01-23 DIAGNOSIS — T82857D Stenosis of cardiac prosthetic devices, implants and grafts, subsequent encounter: Secondary | ICD-10-CM | POA: Diagnosis not present

## 2021-01-23 DIAGNOSIS — Y838 Other surgical procedures as the cause of abnormal reaction of the patient, or of later complication, without mention of misadventure at the time of the procedure: Secondary | ICD-10-CM | POA: Insufficient documentation

## 2021-01-23 DIAGNOSIS — R531 Weakness: Secondary | ICD-10-CM | POA: Diagnosis not present

## 2021-01-23 DIAGNOSIS — T451X5S Adverse effect of antineoplastic and immunosuppressive drugs, sequela: Secondary | ICD-10-CM | POA: Diagnosis not present

## 2021-01-23 DIAGNOSIS — Z7182 Exercise counseling: Secondary | ICD-10-CM | POA: Insufficient documentation

## 2021-01-23 DIAGNOSIS — Z86010 Personal history of colonic polyps: Secondary | ICD-10-CM | POA: Diagnosis not present

## 2021-01-23 DIAGNOSIS — I2510 Atherosclerotic heart disease of native coronary artery without angina pectoris: Secondary | ICD-10-CM | POA: Diagnosis not present

## 2021-01-23 DIAGNOSIS — I110 Hypertensive heart disease with heart failure: Secondary | ICD-10-CM | POA: Insufficient documentation

## 2021-01-23 DIAGNOSIS — Z8774 Personal history of (corrected) congenital malformations of heart and circulatory system: Secondary | ICD-10-CM | POA: Insufficient documentation

## 2021-01-23 DIAGNOSIS — I480 Paroxysmal atrial fibrillation: Secondary | ICD-10-CM | POA: Insufficient documentation

## 2021-01-23 DIAGNOSIS — R001 Bradycardia, unspecified: Secondary | ICD-10-CM | POA: Insufficient documentation

## 2021-01-23 DIAGNOSIS — I5032 Chronic diastolic (congestive) heart failure: Secondary | ICD-10-CM | POA: Diagnosis not present

## 2021-01-23 DIAGNOSIS — G620 Drug-induced polyneuropathy: Secondary | ICD-10-CM | POA: Insufficient documentation

## 2021-01-23 DIAGNOSIS — C9000 Multiple myeloma not having achieved remission: Secondary | ICD-10-CM | POA: Diagnosis not present

## 2021-01-23 DIAGNOSIS — Z7901 Long term (current) use of anticoagulants: Secondary | ICD-10-CM | POA: Diagnosis not present

## 2021-01-23 DIAGNOSIS — Z8249 Family history of ischemic heart disease and other diseases of the circulatory system: Secondary | ICD-10-CM | POA: Diagnosis not present

## 2021-01-23 DIAGNOSIS — Z888 Allergy status to other drugs, medicaments and biological substances status: Secondary | ICD-10-CM | POA: Insufficient documentation

## 2021-01-23 LAB — BASIC METABOLIC PANEL
Anion gap: 5 (ref 5–15)
BUN: 21 mg/dL (ref 8–23)
CO2: 27 mmol/L (ref 22–32)
Calcium: 9.3 mg/dL (ref 8.9–10.3)
Chloride: 108 mmol/L (ref 98–111)
Creatinine, Ser: 1.13 mg/dL (ref 0.61–1.24)
GFR, Estimated: >60 mL/min (ref >60)
Glucose, Bld: 100 mg/dL — ABNORMAL HIGH (ref 70–99)
Potassium: 4.6 mmol/L (ref 3.5–5.1)
Sodium: 140 mmol/L (ref 135–145)

## 2021-01-23 LAB — CBC
HCT: 41.7 % (ref 39.0–52.0)
Hemoglobin: 13.2 g/dL (ref 13.0–17.0)
MCH: 28.9 pg (ref 26.0–34.0)
MCHC: 31.7 g/dL (ref 30.0–36.0)
MCV: 91.2 fL (ref 80.0–100.0)
Platelets: 169 10*3/uL (ref 150–400)
RBC: 4.57 MIL/uL (ref 4.22–5.81)
RDW: 13.8 % (ref 11.5–15.5)
WBC: 5.9 10*3/uL (ref 4.0–10.5)
nRBC: 0 % (ref 0.0–0.2)

## 2021-01-23 NOTE — Patient Instructions (Signed)
Labs done today, your results will be available in MyChart, we will contact you for abnormal readings.  Your physician recommends that you schedule a follow-up appointment in: 3 months with echocardiogram  If you have any questions or concerns before your next appointment please send [place] a message through [place] or call our office at [phone].    TO LEAVE A MESSAGE FOR THE NURSE SELECT OPTION 2, PLEASE LEAVE A MESSAGE INCLUDING: . YOUR NAME . DATE OF BIRTH . CALL BACK NUMBER . REASON FOR CALL**this is important as we prioritize the call backs  [place] AS LONG AS YOU CALL BEFORE 4:00 PM  At the [facility], you and your health needs are our priority. As part of our continuing mission to provide you with exceptional heart care, we have created designated Provider Care Teams. These Care Teams include your primary Cardiologist (physician) and Advanced Practice Providers (APPs- Physician Assistants and Nurse Practitioners) who all work together to provide you with the care you need, when you need it.   You may see any of the following providers on your designated Care Team at your next follow up: [name] Dr Glori Bickers . Dr [name] . Dr Vickki Muff . Darrick Grinder, NP . Lyda Jester, Advance . Audry Riles, PharmD   Please be sure to bring in all your medications bottles to every appointment.

## 2021-01-23 NOTE — Telephone Encounter (Signed)
Advanced Heart Failure Patient Advocate Encounter  I received a message that the patient paid about $96 (30 day supply) for Eliquis. I called the patient's pharmacy and the representative stated that it looked like the patient satisfied his deductible with that last payment. The hope is that the co-pay would be cheaper next month. The only other option we have would be to apply for manufacturer assistance with BMS.  Called and left the patient a message with this information. Would need income and household size to proceed with a BMS application.

## 2021-01-25 ENCOUNTER — Other Ambulatory Visit: Payer: Self-pay | Admitting: Hematology

## 2021-01-25 NOTE — Progress Notes (Signed)
Date:  01/25/2021  ID:  [name] [name], DOB 07/10/1941, MRN [id number]  Provider location: [facility] Type of Visit: Established patient   PCP:  [name], [name], MD  Cardiologist: Dr. Aundra Dubin   History of Present Illness: [name] [name] is a 80 y.o. male who has history of HTN, aortic stenosis and paroxysmal atrial fibrillation. He was hospitalized with atrial fibrillation/RVR in 5/14.  He had TEE-guided cardioversion.  TEE showed bicuspid aortic valve with mild AS and a moderately dilated ascending aorta.  Most echo in 4/17 showed moderate aortic stenosis and MRA chest in 11/17 showed 4.1 cm ascending aorta.   He was on Xarelto for anticoagulation but stopped it as he was convinced it was causing joint pains.  He then refused to start any other anticoagulation at that time.    In 5/17, he had been back in atrial fibrillation for several days and was symptomatic.  I started him on Eliquis and planned TEE-guided DCCV given significant symptoms, but he converted back to NSR on his own.  He continued Eliquis for about 1 month then stopped it on his own.    In 11/17, he was hospitalized with symptomatic atrial fibrillation with RVR.  I started him on diltiazem CD and Eliquis with plan for TEE-guided DCCV.  However, he converted back to NSR on his own. He stopped the diltiazem but has continued the Eliquis.   He was admitted in 2/18 with fever, LUL PNA and left-sided pleural effusion.  Thoracentesis on left was suggestive of parapneumonic effusion.  He was in the hospital 11 days.  During that time, he went into atrial fibrillation with RVR.  He was started on amiodarone and went back into NSR.  Amiodarone was subsequently stopped.   Recurrent atrial fibrillation with RVR in 2/19, felt more fatigued.  He underwent TEE-guided DCCV back to NSR.   He had a lower GI bleed in 11/19 post-polypectomy.  He has since restarted on Eliquis.   Coronary CTA was done in  2/20, this showed mild nonobstructive CAD.  Echo in 2/20 showed EF 60-65%, bicuspid aortic valve with severe AS.   LHC in 6/20 showed no significant coronary disease.  In 8/20, he had TAVR with Harrison 3 THV x 2 (valve in valve due to peri-valvular leak initially).  Post-procedure echoes have shown elevated gradient across the aortic valve though dimensionless index has only been in the mildly stenotic range.  Last echo in 9/20 showed mean aortic valve gradient 26 mmHg with dimensionless index 0.54.   Patient has additionally been diagnosed with multiple myeloma.  He was treated with Revlimid, Velcade, and prednisone.   He was admitted early in 1/21 with atrial fibrillation/RVR associated with ischemic colitis.  He underwent TEE-guided DCCV to NSR.   He was readmitted later in 1/21 with PNA.    He fell in 4/21, tripped and fractured his left proximal humerus.  He had ORIF in 5/21.   In 7/21, he had a left inguinal hernia repair.  Echo in 8/21 showed EF 60-65%, normal RV, ascending aorta 45 mm, stable TAVR valve with mean gradient 15 mmHg.   Patient returns for followup of aortic stenosis and atrial fibrillation.  He is in NSR today  Still has neuropathic pain in his feet, this is very bothersome and limiting.  He has been seeing a neurologist but has really not had much relief yet.  He is walking with a cane due to poor balance.  Legs are weak but he denies dyspnea. Wants to try to get to [place] this summer.  No recent palpitations.  No lightheadedness or falls.  Weight stable.   ECG (personally reviewed): NSR, nonspecific T wave changes.   Labs (5/14): K 3.7, creatinine 0.9, BNP 2261=>109, TSH normal Labs (7/14): K 3.5, creatinine 1.0 Labs (12/14): K 3.8, creatinine 1.0, LDL particle number 1374, LDL 107, TSH normal Labs (6/16): TSH normal, K 3.8, creatinine 0.84, HCT 42.4, LDL 85, LFTs normal Labs (3/17): K 3.8, creatinine 0.87 Labs (5/17): K 4.5, creatinine 0.97, HCT 43.7 Labs  (11/17): K 3.2, creatinine 0.81, LDL 71, HDL 24 Labs (2/18): K 3.8, creatinine 0.89 Labs (2/19): K 3.7, creatinine 0.83, hgb 16 Labs (11/19): LDL 82 Labs (1/20): TSH elevated but free T4 normal, K 4, creatinine 0.85, hgb 13.5 Labs (2/20): TSH mildly elevated but free T4 normal, K 4, creatinine 0.85 Labs (5/20): ESR 48 Labs (9/20): hgb 14.1, plts 38 => 82, K 3.3, creatinine 0.98 => 1.06 Labs (11/20): BNP 126.5 Labs (12/20): hgb 11.9, K 3.2, creatinine 0.87 Labs (1/21): K 4.8, creatinine 1.42, AST 30, ALT 60 Labs (4/21): K 4.5, creatinine 1.22, hgb 12.3, plts 122 Labs (7/21): hgb 13.7, K 4.6, creatinine 1.31, AST 36, ALT 76 Labs (3/22): K 3.7, creatinine 1.05, LFTs normal  Allergies (verified):  No Known Drug Allergies   Past Medical History:  1. Hypertension: ACEI cough. Edema with amlodipine.  2. Atrial fibrillation. The patient had new-onset atrial fibrillation in November 2009. He underwent ibutilide cardioversion successfully. He has had 1-2 episodes/year that are short-lived that likely are atrial fibrillation with RVR. He was admitted in 5/14 with atrial fibrillation/RVR and had TEE-guided DCCV.  Atrial fibrillation again in 5/17, converted back to NSR spontaneously.  CHADSVASC score 2.  - Atrial fibrillation 2/19 with TEE-guided DCCV.  - 1/21 TEE-guided DCCV 3. Hypercholesterolemia.  4. Bicuspid aortic valve disorder: Echo (7/11): EF 55-60%, mild LV hypertrophy, mild aortic stenosis with mean gradient 19 mmHg and peak gradient 36 mmHg.  TEE (5/14): EF 55%, mild LVH, bicuspid aortic valve with mild AS (mean gradient 13 mmHg), ascending aorta 4.5 cm.  Echo (9/15) with EF 65-70%, moderate AS (mean gradient 23 mmHg), mild AI, ascending aorta 4.1 cm, mild MR.   - Echo (4/17) with EF 65-70%, moderate aortic stenosis with mean gradient 27 mmHg, PASP 31 mmHg, ascending aorta 4.4 cm.  - Echo (2/18) with EF 55-60%, bicuspid aortic valve with moderate aortic stenosis (underestimated gradient).   - TEE (2/19): EF 60-65%, moderate LVH, bicuspid aortic valve with moderate AS with mean gradient 28 mmHg and AVA 1.2 cm^2, 4.4 cm ascending aorta.  - Echo (2/20): EF 60-65%, mild LVH, bicuspid aortic valve with severe AS (mean gradient 48 mmHg, AVA 0.8 cm^2).  - TAVR 8/20 with valve in valve Edwards Sapien 3 THVs (because of peri-valvular leak after initial valve placed).  - Echo (9/20): EF > 65%, mild LVH, mild RV dilation with normal RV systolic function, mean aortic valve gradient 26 mmHg with dimensionless index 0.54, IVC normal.  - TEE (1/21): EF 60-65%, moderate LVH, normal RV, s/p valve-in-valve TAVR with mean gradient 8 mmHg and no PVL.  - Echo (8/21): EF 60-65%, normal RV, ascending aorta 45 mm, stable TAVR valve with mean gradient 15 mmHg.  5. Osteoarthritis.  6. Low back pain.  7. Chest pain: ETT-myoview (12/11) with 10:51 exercise, no chest pain, no significant ST changes, EF 69%, no evidence for ischemia or infarction.  - Coronary  CTA (2/20): calcium score 41, nonobstructive CAD.  - LHC (6/20): No significant CAD.  8. Ascending aortic aneurysm: Associated with bicuspid aortic valve.  4.5 cm by TEE in 5/14.   MRA chest (6/14) with bicuspid aortic valve, 4.3 cm ascending aortic aneurysm.  MRA chest (10/15) with 4.2 cm ascending aorta (bicuspid aortic valve noted).  Echo (4/17) with ascending aorta diameter 4.4 cm.  - MRA chest (11/17) with 4.1 cm ascending aorta.  - Echo (2/19): 4.4 cm ascending aorta.  - Coronary CTA in 2/20 showed 4.3 cm ascending aorta.  - Echo (8/21) with 4.5 cm ascending aorta.  9. Colonic polyps: GI bleeding in 11/19 s/p polypectomy.  10. Multiple myeloma.  11. Bradycardia 12. Thrombocytopenia: likely related to chemotherapy for multiple myeloma.  13. PVCs: Zio monitor in 10/20 showed 9.4% PVCs.  14. Chronic diastolic CHF: RHC (92/44) with mean RA 3, PA 22/2, mean PCWP 6, CI 2.74 15. Left inguinal hernia: s/p repair.   Current Outpatient Medications   Medication Sig Dispense Refill  . apixaban (ELIQUIS) 5 MG TABS tablet Take 5 mg by mouth 2 (two) times daily.    [name] b complex vitamins tablet Take 1 tablet by mouth daily. 100 tablet 3  . Calcium-Magnesium 500-250 MG TABS Take 1 tablet by mouth daily.    . Carboxymethylcellul-Glycerin (LUBRICATING EYE DROPS OP) Place 1 drop into both eyes daily as needed (dry eyes).    . Cholecalciferol (VITAMIN D) 50 MCG (2000 UT) tablet Take 2,000 Units by mouth daily.    . diclofenac Sodium (VOLTAREN) 1 % GEL Apply 1 application topically 2 (two) times daily as needed (pain.).    [name] DULoxetine (CYMBALTA) 60 MG capsule Take 60 mg by mouth every other day.    . famotidine (PEPCID) 40 MG tablet Take 40 mg by mouth daily.    [name] gabapentin (NEURONTIN) 300 MG capsule Take 300 mg by mouth at bedtime.    [name] LORazepam (ATIVAN) 0.5 MG tablet Take 1 tablet (0.5 mg total) by mouth every 8 (eight) hours as needed (Nausea or vomiting). 60 tablet 2  . metoprolol succinate (TOPROL-XL) 25 MG 24 hr tablet TAKE 1 TABLET BY MOUTH TWICE A DAY 180 tablet 3  . oxyCODONE (OXY IR/ROXICODONE) 5 MG immediate release tablet Take 1-2 tablets (5-10 mg total) by mouth 2 (two) times daily as needed for severe pain. 60 tablet 0  . polyethylene glycol powder (GLYCOLAX/MIRALAX) 17 GM/SCOOP powder Take 17-34 g by mouth 2 (two) times daily as needed for moderate constipation. 500 g 5  . senna-docusate (SENOKOT-S) 8.6-50 MG tablet Take 2 tablets by mouth at bedtime.    [name] spironolactone (ALDACTONE) 25 MG tablet Take 1 tablet (25 mg total) by mouth daily. 30 tablet 3  . Vitamin D, Ergocalciferol, (DRISDOL) 1.25 MG (50000 UNIT) CAPS capsule TAKE 1 CAPSULE BY MOUTH ONE TIME PER WEEK 12 capsule 3   No current facility-administered medications for this encounter.    Allergies:   Xarelto [rivaroxaban], Corticosteroids, Ramipril, Zolpidem, and Benazepril   Social History:  The patient  reports that he has never smoked. He has never used smokeless  tobacco. He reports previous alcohol use. He reports that he does not use drugs.   Family History:  The patient's family history includes Colon cancer (age of onset: 69) in his mother; Hypertension in an other family member.   ROS:  Please see the history of present illness.   All other systems are personally reviewed and negative.   Exam:  BP 128/70   Pulse (!) 58   Wt 79.6 kg (175 lb 6.4 oz)   SpO2 97%   BMI 24.46 kg/m  General: NAD Neck: No JVD, no thyromegaly or thyroid nodule.  Lungs: Clear to auscultation bilaterally with normal respiratory effort. CV: Nondisplaced PMI.  Heart regular S1/S2, no S3/S4, 2/6 SEM RUSB with clear S2.  Trace ankle edema.  No carotid bruit.  Normal pedal pulses.  Abdomen: Soft, nontender, no hepatosplenomegaly, no distention.  Skin: Intact without lesions or rashes.  Neurologic: Alert and oriented x 3.  Psych: Normal affect. Extremities: No clubbing or cyanosis.  HEENT: Normal.   Recent Labs: 06/23/2020: TSH 5.338 11/18/2020: ALT 20 01/23/2021: BUN 21; Creatinine, Ser 1.13; Hemoglobin 13.2; Platelets 169; Potassium 4.6; Sodium 140  Personally reviewed   Wt Readings from Last 3 Encounters:  01/23/21 79.6 kg (175 lb 6.4 oz)  12/10/20 78.9 kg (174 lb)  11/18/20 80.2 kg (176 lb 11.2 oz)    ASSESSMENT AND PLAN:  1. Atrial fibrillation: Paroxysmal. He is quite symptomatic when in atrial fibrillation.  CHADSVASC = 3.  He is in NSR today.  Now off amiodarone.  - Continue Eliquis.  - He has not wanted atrial fibrillation ablation.   2. Peripheral neuropathy: Patient developed a very painful peripheral neuropathy on Velcade.  This is still very symptomatic.  3. Bradycardia: After TAVR, patient had RBBB and LAFB with HR down to the 40s.  RBBB has resolved and HR is higher.  4. PVCs: Zio monitor in 10/20 showed 9.4% PVCs.  Toprol XL was increased to 25 mg bid, he is not feeling palpitations.   5. HTN: BP controlled.       - Continue spironolactone 25 mg  daily. BMET today.  6. Bicuspid aortic valve disorder: He is now s/p TAVR with 2 Edwards Sapien THVs (valve-in-valve due to peri-valvular leak after 1st valve placed). Post-op, mean gradient across the aortic valve was elevated, 26 mmHg in 9/20.  Dimensionless index, in 9/20 0.54, only suggests mild bioprosthetic aortic stenosis.  Echo in 1/21 showed mean gradient down to 10 mmHg and TEE showed mean gradient 8 mmHg with no peri-valvular leakage.  Echo in 8/21 with mean gradient 15 mmHg and 4.5 cm ascending aorta.  - Repeat echo at followup in 3 months.   - Follow aorta by echo for now, he would likely be a poor candidate for ascending aorta replacement given frailty and age.  7. Chronic diastolic CHF: He is no longer taking Lasix.  He does not look volume overloaded.   8. Multiple myeloma: He is off Revlimid, prednisone, and Velcade.  He appears to have had a good response.    9. Deconditioning: Encouraged increased activity.   Followup in 3 months with echo.   Signed, [name], MD  01/25/2021  [place] [address] [facility] [place] [address] [phone] (office) [phone] (fax)

## 2021-02-04 ENCOUNTER — Other Ambulatory Visit: Payer: Self-pay | Admitting: Internal Medicine

## 2021-02-10 ENCOUNTER — Ambulatory Visit: Payer: Medicare Other

## 2021-02-10 NOTE — Progress Notes (Signed)
HEMATOLOGY/ONCOLOGY CLINIC NOTE  Date of Service: 02/11/2021  Patient Care Team: Tresa Garter, MD as PCP - General Laurey Morale, MD as PCP - Advanced Heart Failure (Cardiology) Laurey Morale, MD as PCP - Cardiology (Cardiology) Maeola Harman, MD as Attending Physician (Neurosurgery) Donzetta Starch, MD as Consulting Physician (Dermatology) Oretha Milch, MD as Consulting Physician (Pulmonary Disease) Glendale Chard, DO as Consulting Physician (Neurology)   CHIEF COMPLAINTS/PURPOSE OF CONSULTATION:   Plasma cell myeloma   HISTORY OF PRESENTING ILLNESS:  [name] [name] is a wonderful 80 y.o. male who has been referred to [place] by Cline Crock, PA for evaluation and management of lytic bone lesions. The pt reports that he is doing well overall.  The pt reports that he has occasional hip pain that radiates down his legs and prevents him from walking. He uses Advil, which helps his hip pain. He is used to exercising often, but has not been able to stay very physically active lately so he is gaining weight. The pt experiences some SOB when he wakes up in the morning.  The pt had a pre-procedural CTA C/A/P before a TAVR completed on 03/14/2019 which revealed "indeterminate osseus lesions in the bony pelvis," which led to an MRI and PET scan. He reports that he has pain when he pushes on his right chest.   He also notes that he had a fall while exercising in 06/2018 and thought he broke his back. He received a blood transfusion on 07/24/2018.   Of note prior to the patient's visit today, the pt has had a MRI pelvis w/wo contrast completed on 03/28/2019 with results revealing "1. Destructive bone lesions as detailed above. Findings most consistent with metastatic disease. PET-CT may be helpful for further evaluation and to establish a primary tumor. The right pelvic bone lesions should be amenable to image guided biopsy but a PET scan may demonstrate easier/safer biopsy sites. 2.  No intrapelvic mass or adenopathy. 3. Benign intraosseous lipoma involving the left anterior superior acetabulum."  The pt has also had PET whole body completed on 04/06/2019 with results revealing "1. Diffuse osseous metastatic disease as detailed above without findings for a primary neoplasm in the chest, abdomen or pelvis. The large destructive lesion involving the right ischium should be amenable to image guided biopsy. 2. Two small retroperitoneal lymph nodes and 1 small right obturator node showing hypermetabolism."   Most recent lab results (04/06/2019) of CBC is as follows: all values are WNL.  On review of systems, pt reports hip and leg pain, weight gain and denies syncope and denies belly pain, recent neuropathy and any other symptoms.   On PMHx the pt reports 5 cm hepatic flexure polyp removal, pneumonia, blood transfusion on 07/24/2018  On Social Hx the pt reports that he lives at home with his wife. He is from [place].    INTERVAL HISTORY: [name] [name] is a wonderful 80 y.o. male who has who is here today for evaluation and management of his Plasma Cell Myeloma. Pt is accompanied by his wife. The patient's last visit with [place] was on 11/18/2020. The pt reports that he is doing well overall.  The pt reports that the pain in his foot is still very bothersome, but is unchanged from the last visit. This pain is worsened after walking and exercise. He has not had any falls or balance issues. He uses his cane sometimes, but not all the time. The pt no longer has any leg swelling. He  has stopped some medications due to nightmares and this has since resolved it. He takes the Oxycodone only prn and now only takes two maximum daily. He continues to try to take as least medication as possible. He notes some intermittent dizziness. He notes one instance of blurred vision and inability to see the TV for a few seconds, but this has not occurred again.  He also notes some neuropathy in his fingers,  unchanged from last visit. The pt notes for three weeks he experienced some white foaming of the mouth. He denies any gum inflammation or white spots in his mouth. This resolved on its own and he is unsure why this happened.  The pt notes that he is planning to go to [place] at the end of August.   Lab results today 02/11/2021 of CBC w/diff and CMP is as follows: all values are WNL except for Sodium of 146, Glucose of 104. 02/11/2021 MMP in progress. 02/11/2021 Light chains in progress.  On review of systems, pt reports chronic foot pain, intermittent dizziness, neuropathy in fingers, intermittent blurred vision, salivation and denies leg swelling, nightmares, SOB, chest pain, thrush, gum inflammation, decreased appetite, sudden weight loss, and any other symptoms.  MEDICAL HISTORY:  Past Medical History:  Diagnosis Date  . Ascending aortic aneurysm ([place])   . Bicuspid aortic valve   . Cancer ([place])   . CHF NYHA class I (no symptoms from ordinary activities), acute, diastolic ([place])   . Dysrhythmia 2009   A fib  . Fatty liver    mild  . Fracture    left proximal humerus  . GERD (gastroesophageal reflux disease)   . GI bleeding 07/21/2018   post polypectomy  . Hemorrhoids   . HTN (hypertension)   . Hypercholesteremia   . Hypokalemia   . Internal hemorrhoids   . LBP (low back pain)   . Moderate aortic stenosis   . Osteoarthritis   . Paroxysmal atrial fibrillation ([place])    a. new onset Afib in 07/2008. He underwent ibutilide cardioversion successfully. b. Recurrence 01/2013 s/p TEE/DCCV - was on Xarelto but he stopped it as he was convinced it was causing joint pn. c. Recurrence 01/2016 - spont conv to NSR. Pt took Eliquis x1 mo then declined further anticoag. d. Recurrence 07/2016.  [name] Pneumonia   . Tubular adenoma of colon     SURGICAL HISTORY: Past Surgical History:  Procedure Laterality Date  . BACK SURGERY  x12 years ago  . CARDIAC CATHETERIZATION  2020  . CARDIAC VALVE  REPLACEMENT  2020  . CARDIOVERSION N/A 01/26/2013   Procedure: CARDIOVERSION;  Surgeon: Larey Dresser, MD;  Location: [facility] ENDOSCOPY;  Service: Cardiovascular;  Laterality: N/A;  . CARDIOVERSION N/A 10/28/2017   Procedure: CARDIOVERSION;  Surgeon: Larey Dresser, MD;  Location: [facility] ENDOSCOPY;  Service: Cardiovascular;  Laterality: N/A;  . CARDIOVERSION N/A 03/03/2018   Procedure: CARDIOVERSION;  Surgeon: Lelon Perla, MD;  Location: [facility] ENDOSCOPY;  Service: Cardiovascular;  Laterality: N/A;  . CARDIOVERSION N/A 09/19/2019   Procedure: CARDIOVERSION;  Surgeon: Larey Dresser, MD;  Location: [facility] ENDOSCOPY;  Service: Cardiovascular;  Laterality: N/A;  . COLONOSCOPY    . COLONOSCOPY  07/17/2018   at [facility]  . HEMORRHOID SURGERY    . INGUINAL HERNIA REPAIR Left 03/18/2020   Procedure: OPEN LEFT INGUINAL HERNIA REPAIR;  Surgeon: Alphonsa Overall, MD;  Location: [place];  Service: General;  Laterality: Left;  . LUMBAR LAMINECTOMY    . ORIF HUMERUS FRACTURE Left 01/15/2020  Procedure: OPEN REDUCTION INTERNAL FIXATION (ORIF) PROXIMAL HUMERUS FRACTURE;  Surgeon: Nicholes Stairs, MD;  Location: [place];  Service: Orthopedics;  Laterality: Left;  . POLYPECTOMY    . RIGHT HEART CATH N/A 08/20/2019   Procedure: RIGHT HEART CATH;  Surgeon: Larey Dresser, MD;  Location: [place] CV LAB;  Service: Cardiovascular;  Laterality: N/A;  . RIGHT/LEFT HEART CATH AND CORONARY ANGIOGRAPHY N/A 03/07/2019   Procedure: RIGHT/LEFT HEART CATH AND CORONARY ANGIOGRAPHY;  Surgeon: Burnell Blanks, MD;  Location: [place] CV LAB;  Service: Cardiovascular;  Laterality: N/A;  . [name]  04/2019  . TEE WITHOUT CARDIOVERSION N/A 01/26/2013   Procedure: TRANSESOPHAGEAL ECHOCARDIOGRAM (TEE);  Surgeon: Larey Dresser, MD;  Location: [place];  Service: Cardiovascular;  Laterality: N/A;  . TEE WITHOUT CARDIOVERSION N/A 10/28/2017   Procedure: TRANSESOPHAGEAL ECHOCARDIOGRAM (TEE);  Surgeon: Larey Dresser, MD;  Location:  [facility] ENDOSCOPY;  Service: Cardiovascular;  Laterality: N/A;  . TEE WITHOUT CARDIOVERSION N/A 05/08/2019   Procedure: TRANSESOPHAGEAL ECHOCARDIOGRAM (TEE);  Surgeon: Burnell Blanks, MD;  Location: [place] CV LAB;  Service: Open Heart Surgery;  Laterality: N/A;  . TEE WITHOUT CARDIOVERSION N/A 09/19/2019   Procedure: TRANSESOPHAGEAL ECHOCARDIOGRAM (TEE);  Surgeon: Larey Dresser, MD;  Location: [facility] ENDOSCOPY;  Service: Cardiovascular;  Laterality: N/A;  . TRANSCATHETER AORTIC VALVE REPLACEMENT, TRANSFEMORAL N/A 05/08/2019   Procedure: TRANSCATHETER AORTIC VALVE REPLACEMENT, TRANSFEMORAL;  Surgeon: Burnell Blanks, MD;  Location: [place] CV LAB;  Service: Open Heart Surgery;  Laterality: N/A;    SOCIAL HISTORY: Social History   Socioeconomic History  . Marital status: Married    Spouse name: Not on file  . Number of children: 0  . Years of education: Not on file  . Highest education level: Not on file  Occupational History  . Occupation: Retired Lobbyist: [employer]  Tobacco Use  . Smoking status: Never Smoker  . Smokeless tobacco: Never Used  Vaping Use  . Vaping Use: Never used  Substance and Sexual Activity  . Alcohol use: Not Currently  . Drug use: No  . Sexual activity: Yes  Other Topics Concern  . Not on file  Social History Narrative   Patient lives in [place] w/ his wife. He is a native of [place]. 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   Right-handed   Caffeine: 2 cups coffee per day   Two story home   Social Determinants of Health   Financial Resource Strain: Not on file  Food Insecurity: Not on file  Transportation Needs: Not on file  Physical Activity: Not on file  Stress: Not on file  Social Connections: Not on file  Intimate Partner Violence: Not on file    FAMILY HISTORY: Family History  Problem Relation Age of Onset  . Colon cancer Mother 25  . Hypertension Other   .  Coronary artery disease Neg Hx   . Colon polyps Neg Hx   . Esophageal cancer Neg Hx   . Rectal cancer Neg Hx   . Stomach cancer Neg Hx     ALLERGIES:  is allergic to xarelto [rivaroxaban], corticosteroids, ramipril, zolpidem, and benazepril.  MEDICATIONS:  Current Outpatient Medications  Medication Sig Dispense Refill  . apixaban (ELIQUIS) 5 MG TABS tablet Take 5 mg by mouth 2 (two) times daily.    [name] b complex vitamins tablet Take 1 tablet by mouth daily. 100 tablet 3  . Calcium-Magnesium  500-250 MG TABS Take 1 tablet by mouth daily.    . Carboxymethylcellul-Glycerin (LUBRICATING EYE DROPS OP) Place 1 drop into both eyes daily as needed (dry eyes).    . Cholecalciferol (VITAMIN D) 50 MCG (2000 UT) tablet Take 2,000 Units by mouth daily.    . diclofenac Sodium (VOLTAREN) 1 % GEL Apply 1 application topically 2 (two) times daily as needed (pain.).    [name] DULoxetine (CYMBALTA) 60 MG capsule Take 60 mg by mouth every other day.    . famotidine (PEPCID) 40 MG tablet TAKE 1 TABLET BY MOUTH EVERY DAY 90 tablet 3  . gabapentin (NEURONTIN) 300 MG capsule Take 300 mg by mouth at bedtime.    [name] LORazepam (ATIVAN) 0.5 MG tablet Take 1 tablet (0.5 mg total) by mouth every 8 (eight) hours as needed (Nausea or vomiting). 60 tablet 2  . metoprolol succinate (TOPROL-XL) 25 MG 24 hr tablet TAKE 1 TABLET BY MOUTH TWICE A DAY 180 tablet 3  . oxyCODONE (OXY IR/ROXICODONE) 5 MG immediate release tablet Take 1-2 tablets (5-10 mg total) by mouth 2 (two) times daily as needed for severe pain. 60 tablet 0  . polyethylene glycol powder (GLYCOLAX/MIRALAX) 17 GM/SCOOP powder Take 17-34 g by mouth 2 (two) times daily as needed for moderate constipation. 500 g 5  . senna-docusate (SENOKOT-S) 8.6-50 MG tablet Take 2 tablets by mouth at bedtime.    [name] spironolactone (ALDACTONE) 25 MG tablet Take 1 tablet (25 mg total) by mouth daily. 30 tablet 3  . Vitamin D, Ergocalciferol, (DRISDOL) 1.25 MG (50000 UNIT) CAPS capsule TAKE 1  CAPSULE BY MOUTH ONE TIME PER WEEK 12 capsule 3   No current facility-administered medications for this visit.   Facility-Administered Medications Ordered in Other Visits  Medication Dose Route Frequency Provider Last Rate Last Admin  . 0.9 %  sodium chloride infusion   Intravenous Continuous Brunetta Genera, MD 20 mL/hr at 02/11/21 1014 New Bag at 02/11/21 1014  . Zoledronic Acid (ZOMETA) IVPB 4 mg  4 mg Intravenous Once Brunetta Genera, MD 400 mL/hr at 02/11/21 1016 4 mg at 02/11/21 1016    REVIEW OF SYSTEMS:   10 Point review of Systems was done is negative except as noted above.   PHYSICAL EXAMINATION: There were no vitals filed for this visit. Wt Readings from Last 3 Encounters:  01/23/21 175 lb 6.4 oz (79.6 kg)  12/10/20 174 lb (78.9 kg)  11/18/20 176 lb 11.2 oz (80.2 kg)   There is no height or weight on file to calculate BMI.    ECOG FS:2 - Symptomatic, <50% confined to bed  Exam was given in infusion.  NAD. GENERAL:alert, in no acute distress and comfortable SKIN: no acute rashes, no significant lesions EYES: conjunctiva are pink and non-injected, sclera anicteric OROPHARYNX: MMM, no exudates, no oropharyngeal erythema or ulceration NECK: supple, no JVD LYMPH:  no palpable lymphadenopathy in the cervical, axillary or inguinal regions LUNGS: clear to auscultation b/l with normal respiratory effort HEART: regular rate & rhythm ABDOMEN:  normoactive bowel sounds , non tender, not distended. Extremity: no pedal edema PSYCH: alert & oriented x 3 with fluent speech NEURO: no focal motor/sensory deficits   LABORATORY DATA:  I have reviewed the data as listed  . CBC Latest Ref Rng & Units 02/11/2021 01/23/2021 11/18/2020  WBC 4.0 - 10.5 K/uL 5.9 5.9 6.1  Hemoglobin 13.0 - 17.0 g/dL 13.7 13.2 13.0  Hematocrit 39.0 - 52.0 % 42.7 41.7 40.4  Platelets 150 -  400 K/uL 175 169 138(L)    . CMP Latest Ref Rng & Units 02/11/2021 01/23/2021 11/18/2020  Glucose 70 - 99  mg/dL 104(H) 100(H) 123(H)  BUN 8 - 23 mg/dL $Remove'21 21 19  'aZxbLbo$ Creatinine 0.61 - 1.24 mg/dL 1.14 1.13 1.05  Sodium 135 - 145 mmol/L 146(H) 140 142  Potassium 3.5 - 5.1 mmol/L 4.1 4.6 3.7  Chloride 98 - 111 mmol/L 108 108 109  CO2 22 - 32 mmol/L $RemoveB'24 27 25  'IfRxmTOz$ Calcium 8.9 - 10.3 mg/dL 9.8 9.3 8.9  Total Protein 6.5 - 8.1 g/dL 6.8 - 6.1(L)  Total Bilirubin 0.3 - 1.2 mg/dL 0.4 - 0.3  Alkaline Phos 38 - 126 U/L 59 - 57  AST 15 - 41 U/L 25 - 21  ALT 0 - 44 U/L 29 - 20   Component     Latest Ref Rng & Units 04/30/2019  Total Protein, Urine-UPE24     Not Estab. mg/dL   Total Protein, Urine-Ur/day     30 - 150 mg/24 hr   ALBUMIN, U     %   ALPHA 1 URINE     %   Alpha 2, Urine     %   % BETA, Urine     %   GAMMA GLOBULIN URINE     %   Free Kappa Lt Chains,Ur     0.63 - 113.79 mg/L   Free Lambda Lt Chains,Ur     0.47 - 11.77 mg/L   Free Kappa/Lambda Ratio     1.03 - 31.76   Immunofixation Result, Urine        Total Volume        M-SPIKE %, Urine     Not Observed %   M-Spike, mg/24 hr     Not Observed mg/24 hr   NOTE:        IgG (Immunoglobin G), Serum     603 - 1,613 mg/dL 1,740 (H)  IgA     61 - 437 mg/dL 16 (L)  IgM (Immunoglobulin M), Srm     15 - 143 mg/dL 9 (L)  Total Protein ELP     6.0 - 8.5 g/dL 7.3  Albumin SerPl Elph-Mcnc     2.9 - 4.4 g/dL 3.8  Alpha 1     0.0 - 0.4 g/dL 0.3  Alpha2 Glob SerPl Elph-Mcnc     0.4 - 1.0 g/dL 0.7  B-Globulin SerPl Elph-Mcnc     0.7 - 1.3 g/dL 0.9  Gamma Glob SerPl Elph-Mcnc     0.4 - 1.8 g/dL 1.6  M Protein SerPl Elph-Mcnc     Not Observed g/dL 1.5 (H)  Globulin, Total     2.2 - 3.9 g/dL 3.5  Albumin/Glob SerPl     0.7 - 1.7 1.1  IFE 1      Comment  Please Note (HCV):      Comment  Kappa free light chain     3.3 - 19.4 mg/L 821.2 (H)  Lamda free light chains     5.7 - 26.3 mg/L 1.9 (L)  Kappa, lamda light chain ratio     0.26 - 1.65 432.21 (H)  LDH     98 - 192 U/L 132  Sed Rate     0 - 16 mm/hr 3      05/24/2019  Bone Marrow Biopsy    04/16/2019 Surgical Pathology:   RADIOGRAPHIC STUDIES: I have personally reviewed the radiological images as listed and agreed with the findings in the  report. No results found.  ASSESSMENT & PLAN:   80 yo   #1 Recently diagnosed Plasma cell myeloma  03/28/2019 MRI pelvis w/wo contrast revealed "1. Destructive bone lesions as detailed above. Findings most consistent with metastatic disease. PET-CT may be helpful for further evaluation and to establish a primary tumor. The right pelvic bone lesions should be amenable to image guided biopsy but a PET scan may demonstrate easier/safer biopsy sites. 2. No intrapelvic mass or adenopathy. 3. Benign intraosseous lipoma involving the left anterior superior acetabulum."  04/06/2019 PET whole body revealed "1. Diffuse osseous metastatic disease as detailed above without findings for a primary neoplasm in the chest, abdomen or pelvis. The large destructive lesion involving the right ischium should be amenable to image guided biopsy. 2. Two small retroperitoneal lymph nodes and 1 small right obturator node showing hypermetabolism."   04/16/2019 Posterior right pelvis bone biopsy revealed "PLASMA CELL NEOPLASM"  05/24/2019 Bone Marrow Biopsy revealed "BONE MARROW: - CELLULAR MARROW WITH INVOLVEMENT BY PLASMA CELL NEOPLASM (20%) PERIPHERAL BLOOD: - MORPHOLOGICALLY UNREMARKABLE"  05/24/2019 FISH Panel revealed "ABNORMAL result with 11q+, 14q+ and +17"  #2 Severe aortic stenosis with bicuspid aortic valve -10/26/2018 ECHO revealed AVA at 0.8 cm2 and LV EF of 60-65% -05/08/2019 pt had a Transfemoral Transcatheter Aortic Valve Replacement   PLAN: -Discussed pt labwork today, 02/11/2021; blood counts completely normal, chemistries stable. -Advised pt that the water pills and pain medication could be causing some of the dizziness. Ensure that the pt drinks enough water daily. -Recommended pt stay active to help maintain muscle  mass. -Based on labs, symptoms, and clinical exam pt continues to be in remission from Multiple Myeloma at this time. -No indication to restart treatment. Will continue watchful observation.  -Continue Gabapentin.  -Continue Oxycodone prn. -Continue Zometa q53months -Will see back in 3 months with labs.    FOLLOW UP: Return to clinic with Dr. Irene Limbo with labs and next treatment of Zometa in 3 months   The total time spent in the appointment was 20 minutes and more than 50% was on counseling and direct patient cares.   All of the patient's questions were answered with apparent satisfaction. The patient knows to call the clinic with any problems, questions or concerns.   Sullivan Lone MD [place] AAHIVMS [facility] Hematology/Oncology Physician [facility]  (Office):       [phone] (Work cell):  [phone] (Fax):           [phone]  02/11/2021 10:26 AM  I, Reinaldo Raddle, am acting as scribe for Dr. Sullivan Lone, MD.     .I have reviewed the above documentation for accuracy and completeness, and I agree with the above. Brunetta Genera MD

## 2021-02-11 ENCOUNTER — Inpatient Hospital Stay: Payer: Medicare Other | Attending: Hematology

## 2021-02-11 ENCOUNTER — Other Ambulatory Visit: Payer: Self-pay

## 2021-02-11 ENCOUNTER — Inpatient Hospital Stay: Payer: Medicare Other

## 2021-02-11 ENCOUNTER — Inpatient Hospital Stay (HOSPITAL_BASED_OUTPATIENT_CLINIC_OR_DEPARTMENT_OTHER): Payer: Medicare Other | Admitting: Hematology

## 2021-02-11 VITALS — BP 138/71 | HR 56 | Temp 97.9°F | Resp 18

## 2021-02-11 DIAGNOSIS — C9000 Multiple myeloma not having achieved remission: Secondary | ICD-10-CM

## 2021-02-11 DIAGNOSIS — Z7189 Other specified counseling: Secondary | ICD-10-CM

## 2021-02-11 DIAGNOSIS — C7951 Secondary malignant neoplasm of bone: Secondary | ICD-10-CM

## 2021-02-11 LAB — CBC WITH DIFFERENTIAL/PLATELET
Abs Immature Granulocytes: 0.04 10*3/uL (ref 0.00–0.07)
Basophils Absolute: 0 10*3/uL (ref 0.0–0.1)
Basophils Relative: 1 %
Eosinophils Absolute: 0.4 10*3/uL (ref 0.0–0.5)
Eosinophils Relative: 6 %
HCT: 42.7 % (ref 39.0–52.0)
Hemoglobin: 13.7 g/dL (ref 13.0–17.0)
Immature Granulocytes: 1 %
Lymphocytes Relative: 21 %
Lymphs Abs: 1.2 10*3/uL (ref 0.7–4.0)
MCH: 29.4 pg (ref 26.0–34.0)
MCHC: 32.1 g/dL (ref 30.0–36.0)
MCV: 91.6 fL (ref 80.0–100.0)
Monocytes Absolute: 0.5 10*3/uL (ref 0.1–1.0)
Monocytes Relative: 9 %
Neutro Abs: 3.7 10*3/uL (ref 1.7–7.7)
Neutrophils Relative %: 62 %
Platelets: 175 10*3/uL (ref 150–400)
RBC: 4.66 MIL/uL (ref 4.22–5.81)
RDW: 14.5 % (ref 11.5–15.5)
WBC: 5.9 10*3/uL (ref 4.0–10.5)
nRBC: 0 % (ref 0.0–0.2)

## 2021-02-11 LAB — CMP (CANCER CENTER ONLY)
ALT: 29 U/L (ref 0–44)
AST: 25 U/L (ref 15–41)
Albumin: 4.4 g/dL (ref 3.5–5.0)
Alkaline Phosphatase: 59 U/L (ref 38–126)
Anion gap: 14 (ref 5–15)
BUN: 21 mg/dL (ref 8–23)
CO2: 24 mmol/L (ref 22–32)
Calcium: 9.8 mg/dL (ref 8.9–10.3)
Chloride: 108 mmol/L (ref 98–111)
Creatinine: 1.14 mg/dL (ref 0.61–1.24)
GFR, Estimated: >60 mL/min (ref >60)
Glucose, Bld: 104 mg/dL — ABNORMAL HIGH (ref 70–99)
Potassium: 4.1 mmol/L (ref 3.5–5.1)
Sodium: 146 mmol/L — ABNORMAL HIGH (ref 135–145)
Total Bilirubin: 0.4 mg/dL (ref 0.3–1.2)
Total Protein: 6.8 g/dL (ref 6.5–8.1)

## 2021-02-11 MED ORDER — ZOLEDRONIC ACID 4 MG/100ML IV SOLN
4.0000 mg | Freq: Once | INTRAVENOUS | Status: AC
  Administered 2021-02-11: 4 mg via INTRAVENOUS

## 2021-02-11 MED ORDER — ZOLEDRONIC ACID 4 MG/100ML IV SOLN
INTRAVENOUS | Status: AC
  Filled 2021-02-11: qty 100

## 2021-02-11 MED ORDER — SODIUM CHLORIDE 0.9 % IV SOLN
INTRAVENOUS | Status: DC
  Filled 2021-02-11: qty 250

## 2021-02-11 NOTE — Patient Instructions (Signed)
Zoledronic Acid Injection (Hypercalcemia, Oncology) What is this medicine? ZOLEDRONIC ACID (ZOE le dron ik AS id) slows calcium loss from bones. It high calcium levels in the blood from some kinds of cancer. It may be used in other people at risk for bone loss. This medicine may be used for other purposes; ask your health care provider or pharmacist if you have questions. COMMON BRAND NAME(S): Zometa What should I tell my health care provider before I take this medicine? They need to know if you have any of these conditions:  cancer  dehydration  dental disease  kidney disease  liver disease  low levels of calcium in the blood  lung or breathing disease (asthma)  receiving steroids like dexamethasone or prednisone  an unusual or allergic reaction to zoledronic acid, other medicines, foods, dyes, or preservatives  pregnant or trying to get pregnant  breast-feeding How should I use this medicine? This drug is injected into a vein. It is given by a health care provider in a hospital or clinic setting. Talk to your health care provider about the use of this drug in children. Special care may be needed. Overdosage: If you think you have taken too much of this medicine contact a poison control center or emergency room at once. NOTE: This medicine is only for you. Do not share this medicine with others. What if I miss a dose? Keep appointments for follow-up doses. It is important not to miss your dose. Call your health care provider if you are unable to keep an appointment. What may interact with this medicine?  certain antibiotics given by injection  NSAIDs, medicines for pain and inflammation, like ibuprofen or naproxen  some diuretics like bumetanide, furosemide  teriparatide  thalidomide This list may not describe all possible interactions. Give your health care provider a list of all the medicines, herbs, non-prescription drugs, or dietary supplements you use. Also tell  them if you smoke, drink alcohol, or use illegal drugs. Some items may interact with your medicine. What should I watch for while using this medicine? Visit your health care provider for regular checks on your progress. It may be some time before you see the benefit from this drug. Some people who take this drug have severe bone, joint, or muscle pain. This drug may also increase your risk for jaw problems or a broken thigh bone. Tell your health care provider right away if you have severe pain in your jaw, bones, joints, or muscles. Tell you health care provider if you have any pain that does not go away or that gets worse. Tell your dentist and dental surgeon that you are taking this drug. You should not have major dental surgery while on this drug. See your dentist to have a dental exam and fix any dental problems before starting this drug. Take good care of your teeth while on this drug. Make sure you see your dentist for regular follow-up appointments. You should make sure you get enough calcium and vitamin D while you are taking this drug. Discuss the foods you eat and the vitamins you take with your health care provider. Check with your health care provider if you have severe diarrhea, nausea, and vomiting, or if you sweat a lot. The loss of too much body fluid may make it dangerous for you to take this drug. You may need blood work done while you are taking this drug. Do not become pregnant while taking this drug. Women should inform their health care provider   if they wish to become pregnant or think they might be pregnant. There is potential for serious harm to an unborn child. Talk to your health care provider for more information. What side effects may I notice from receiving this medicine? Side effects that you should report to your doctor or health care provider as soon as possible:  allergic reactions (skin rash, itching or hives; swelling of the face, lips, or tongue)  bone  pain  infection (fever, chills, cough, sore throat, pain or trouble passing urine)  jaw pain, especially after dental work  joint pain  kidney injury (trouble passing urine or change in the amount of urine)  low blood pressure (dizziness; feeling faint or lightheaded, falls; unusually weak or tired)  low calcium levels (fast heartbeat; muscle cramps or pain; pain, tingling, or numbness in the hands or feet; seizures)  low magnesium levels (fast, irregular heartbeat; muscle cramp or pain; muscle weakness; tremors; seizures)  low red blood cell counts (trouble breathing; feeling faint; lightheaded, falls; unusually weak or tired)  muscle pain  redness, blistering, peeling, or loosening of the skin, including inside the mouth  severe diarrhea  swelling of the ankles, feet, hands  trouble breathing Side effects that usually do not require medical attention (report to your doctor or health care provider if they continue or are bothersome):  anxious  constipation  coughing  depressed mood  eye irritation, itching, or pain  fever  general ill feeling or flu-like symptoms  nausea  pain, redness, or irritation at site where injected  trouble sleeping This list may not describe all possible side effects. Call your doctor for medical advice about side effects. You may report side effects to FDA at 1-800-FDA-1088. Where should I keep my medicine? This drug is given in a hospital or clinic. It will not be stored at home. NOTE: This sheet is a summary. It may not cover all possible information. If you have questions about this medicine, talk to your doctor, pharmacist, or health care provider.  2021 Elsevier/Gold Standard (2019-06-14 09:13:00)  

## 2021-02-12 ENCOUNTER — Telehealth: Payer: Self-pay | Admitting: Hematology

## 2021-02-12 LAB — KAPPA/LAMBDA LIGHT CHAINS
Kappa free light chain: 10.7 mg/L (ref 3.3–19.4)
Kappa, lambda light chain ratio: 1.49 (ref 0.26–1.65)
Lambda free light chains: 7.2 mg/L (ref 5.7–26.3)

## 2021-02-12 NOTE — Telephone Encounter (Signed)
Scheduled follow-up appointment per 6/1 los. Patient is aware.

## 2021-02-15 LAB — MULTIPLE MYELOMA PANEL, SERUM
Albumin SerPl Elph-Mcnc: 4.2 g/dL (ref 2.9–4.4)
Albumin/Glob SerPl: 2.2 — ABNORMAL HIGH (ref 0.7–1.7)
Alpha 1: 0.2 g/dL (ref 0.0–0.4)
Alpha2 Glob SerPl Elph-Mcnc: 0.6 g/dL (ref 0.4–1.0)
B-Globulin SerPl Elph-Mcnc: 0.9 g/dL (ref 0.7–1.3)
Gamma Glob SerPl Elph-Mcnc: 0.4 g/dL (ref 0.4–1.8)
Globulin, Total: 2 g/dL — ABNORMAL LOW (ref 2.2–3.9)
IgA: 72 mg/dL (ref 61–437)
IgG (Immunoglobin G), Serum: 419 mg/dL — ABNORMAL LOW (ref 603–1613)
IgM (Immunoglobulin M), Srm: 56 mg/dL (ref 15–143)
Total Protein ELP: 6.2 g/dL (ref 6.0–8.5)

## 2021-02-17 ENCOUNTER — Ambulatory Visit: Payer: Medicare Other

## 2021-02-17 ENCOUNTER — Encounter: Payer: Self-pay | Admitting: Hematology

## 2021-02-17 ENCOUNTER — Other Ambulatory Visit: Payer: Medicare Other

## 2021-02-17 ENCOUNTER — Ambulatory Visit: Payer: Medicare Other | Admitting: Hematology

## 2021-02-20 ENCOUNTER — Telehealth: Payer: Self-pay

## 2021-02-20 NOTE — Telephone Encounter (Signed)
Contacted pt per Dr Grier Mitts request to let him know: his labs show his myeloma still is in remission  Pt verbalized understanding.

## 2021-03-04 ENCOUNTER — Ambulatory Visit (INDEPENDENT_AMBULATORY_CARE_PROVIDER_SITE_OTHER): Payer: Medicare Other | Admitting: Internal Medicine

## 2021-03-04 ENCOUNTER — Encounter: Payer: Self-pay | Admitting: Internal Medicine

## 2021-03-04 ENCOUNTER — Other Ambulatory Visit: Payer: Self-pay

## 2021-03-04 DIAGNOSIS — I10 Essential (primary) hypertension: Secondary | ICD-10-CM

## 2021-03-04 DIAGNOSIS — G622 Polyneuropathy due to other toxic agents: Secondary | ICD-10-CM | POA: Diagnosis not present

## 2021-03-04 DIAGNOSIS — R361 Hematospermia: Secondary | ICD-10-CM | POA: Insufficient documentation

## 2021-03-04 DIAGNOSIS — I4891 Unspecified atrial fibrillation: Secondary | ICD-10-CM

## 2021-03-04 MED ORDER — METRONIDAZOLE 0.75 % EX CREA: TOPICAL_CREAM | Freq: Two times a day (BID) | CUTANEOUS | 1 refills | Status: DC

## 2021-03-04 MED ORDER — OXYCODONE HCL ER 10 MG PO T12A: 10.0000 mg | EXTENDED_RELEASE_TABLET | Freq: Two times a day (BID) | ORAL | 0 refills | Status: DC

## 2021-03-04 NOTE — Assessment & Plan Note (Signed)
Eliquis, Toprol

## 2021-03-04 NOTE — Assessment & Plan Note (Signed)
Toprol, spironolactone, potassium, amlodipine

## 2021-03-04 NOTE — Assessment & Plan Note (Signed)
Will try Oxycontin

## 2021-03-04 NOTE — Assessment & Plan Note (Signed)
One episode

## 2021-03-04 NOTE — Progress Notes (Signed)
Subjective:  Patient ID: [name] [name], male    DOB: 10/29/1940  Age: 79 y.o. MRN: [id number]  CC: Follow-up (3 month f/u)   HPI [name] [name] presents for leg pain - severe burning pain - worse F/u MM, CHF, rash  Outpatient Medications Prior to Visit  Medication Sig Dispense Refill   apixaban (ELIQUIS) 5 MG TABS tablet Take 5 mg by mouth 2 (two) times daily.     b complex vitamins tablet Take 1 tablet by mouth daily. 100 tablet 3   Calcium-Magnesium 500-250 MG TABS Take 1 tablet by mouth daily.     Carboxymethylcellul-Glycerin (LUBRICATING EYE DROPS OP) Place 1 drop into both eyes daily as needed (dry eyes).     Cholecalciferol (VITAMIN D) 50 MCG (2000 UT) tablet Take 2,000 Units by mouth daily.     diclofenac Sodium (VOLTAREN) 1 % GEL Apply 1 application topically 2 (two) times daily as needed (pain.).     gabapentin (NEURONTIN) 300 MG capsule Take 300 mg by mouth at bedtime.     LORazepam (ATIVAN) 0.5 MG tablet Take 1 tablet (0.5 mg total) by mouth every 8 (eight) hours as needed (Nausea or vomiting). 60 tablet 2   metoprolol succinate (TOPROL-XL) 25 MG 24 hr tablet TAKE 1 TABLET BY MOUTH TWICE A DAY 180 tablet 3   oxyCODONE (OXY IR/ROXICODONE) 5 MG immediate release tablet Take 1-2 tablets (5-10 mg total) by mouth 2 (two) times daily as needed for severe pain. 60 tablet 0   polyethylene glycol powder (GLYCOLAX/MIRALAX) 17 GM/SCOOP powder Take 17-34 g by mouth 2 (two) times daily as needed for moderate constipation. 500 g 5   senna-docusate (SENOKOT-S) 8.6-50 MG tablet Take 2 tablets by mouth at bedtime.     spironolactone (ALDACTONE) 25 MG tablet Take 1 tablet (25 mg total) by mouth daily. 30 tablet 3   Vitamin D, Ergocalciferol, (DRISDOL) 1.25 MG (50000 UNIT) CAPS capsule TAKE 1 CAPSULE BY MOUTH ONE TIME PER WEEK 12 capsule 3   DULoxetine (CYMBALTA) 60 MG capsule Take 60 mg by mouth every other day. (Patient not taking: Reported on 03/04/2021)     famotidine (PEPCID) 40 MG tablet  TAKE 1 TABLET BY MOUTH EVERY DAY (Patient not taking: Reported on 03/04/2021) 90 tablet 3   No facility-administered medications prior to visit.    ROS: Review of Systems  Constitutional:  Positive for fatigue. Negative for appetite change and unexpected weight change.  HENT:  Negative for congestion, nosebleeds, sneezing, sore throat and trouble swallowing.   Eyes:  Negative for itching and visual disturbance.  Respiratory:  Negative for cough.   Cardiovascular:  Negative for chest pain, palpitations and leg swelling.  Gastrointestinal:  Negative for abdominal distention, blood in stool, diarrhea and nausea.  Genitourinary:  Negative for frequency and hematuria.  Musculoskeletal:  Positive for back pain and gait problem. Negative for joint swelling and neck pain.  Skin:  Negative for rash.  Neurological:  Positive for weakness and numbness. Negative for dizziness, tremors and speech difficulty.  Psychiatric/Behavioral:  Negative for agitation, behavioral problems, dysphoric mood, sleep disturbance and suicidal ideas. The patient is not nervous/anxious.    Objective:  BP 138/80 (BP Location: Left Arm)   Pulse 62   Temp 98 F (36.7 C) (Oral)   Ht 5\' 11"  (1.803 m)   Wt 175 lb 6.4 oz (79.6 kg)   SpO2 98%   BMI 24.46 kg/m   BP Readings from Last 3 Encounters:  03/04/21 138/80  02/11/21 138/71  01/23/21 128/70    Wt Readings from Last 3 Encounters:  03/04/21 175 lb 6.4 oz (79.6 kg)  01/23/21 175 lb 6.4 oz (79.6 kg)  12/10/20 174 lb (78.9 kg)    Physical Exam Constitutional:      General: He is not in acute distress.    Appearance: He is well-developed.     Comments: NAD  Eyes:     Conjunctiva/sclera: Conjunctivae normal.     Pupils: Pupils are equal, round, and reactive to light.  Neck:     Thyroid: No thyromegaly.     Vascular: No JVD.  Cardiovascular:     Rate and Rhythm: Normal rate and regular rhythm.     Heart sounds: Normal heart sounds. No murmur heard.   No  friction rub. No gallop.  Pulmonary:     Effort: Pulmonary effort is normal. No respiratory distress.     Breath sounds: Normal breath sounds. No wheezing or rales.  Chest:     Chest wall: No tenderness.  Abdominal:     General: Bowel sounds are normal. There is no distension.     Palpations: Abdomen is soft. There is no mass.     Tenderness: There is no abdominal tenderness. There is no guarding or rebound.  Musculoskeletal:        General: Tenderness present. Normal range of motion.     Cervical back: Normal range of motion.     Right lower leg: Edema present.     Left lower leg: Edema present.  Lymphadenopathy:     Cervical: No cervical adenopathy.  Skin:    General: Skin is warm and dry.     Findings: No rash.  Neurological:     Mental Status: He is alert and oriented to person, place, and time.     Cranial Nerves: No cranial nerve deficit.     Sensory: Sensory deficit present.     Motor: Weakness present. No abnormal muscle tone.     Coordination: Coordination normal.     Gait: Gait abnormal.     Deep Tendon Reflexes: Reflexes are normal and symmetric.  Psychiatric:        Behavior: Behavior normal.        Thought Content: Thought content normal.        Judgment: Judgment normal.   Trace edema B Using a cane  Lab Results  Component Value Date   WBC 5.9 02/11/2021   HGB 13.7 02/11/2021   HCT 42.7 02/11/2021   PLT 175 02/11/2021   GLUCOSE 104 (H) 02/11/2021   CHOL 144 08/01/2018   TRIG 167.0 (H) 08/01/2018   HDL 28.20 (L) 08/01/2018   LDLDIRECT 151.5 11/20/2007   LDLCALC 82 08/01/2018   ALT 29 02/11/2021   AST 25 02/11/2021   NA 146 (H) 02/11/2021   K 4.1 02/11/2021   CL 108 02/11/2021   CREATININE 1.14 02/11/2021   BUN 21 02/11/2021   CO2 24 02/11/2021   TSH 5.338 (H) 06/23/2020   PSA 4.20 (H) 08/01/2018   INR 1.2 01/07/2020   HGBA1C 5.8 (H) 05/04/2019    No results found.  Assessment & Plan:        Follow-up: No follow-ups on file.  [name], MD

## 2021-03-12 ENCOUNTER — Other Ambulatory Visit (HOSPITAL_COMMUNITY): Payer: Self-pay | Admitting: Cardiology

## 2021-03-20 ENCOUNTER — Other Ambulatory Visit: Payer: Self-pay

## 2021-03-20 DIAGNOSIS — C9000 Multiple myeloma not having achieved remission: Secondary | ICD-10-CM

## 2021-03-20 MED ORDER — OXYCODONE HCL 5 MG PO TABS: 5.0000 mg | ORAL_TABLET | Freq: Two times a day (BID) | ORAL | 0 refills | Status: DC | PRN

## 2021-03-20 NOTE — Progress Notes (Signed)
Pt was given prescription for Oxycodone 10 mg every 12 hours but due to Insurance denying coverage, did not fill. Pt called to request refill on Oxycodone IR 5 mg. Request sent to Dr Irene Limbo for refill.

## 2021-03-26 ENCOUNTER — Telehealth: Payer: Self-pay | Admitting: *Deleted

## 2021-03-26 NOTE — Chronic Care Management (AMB) (Signed)
  Chronic Care Management   Outreach Note  03/26/2021 Name: [name] [name] MRN: [id number] DOB: 1940-10-06  [name] is a 80 y.o. year old male who is a primary care patient of [name], [name], MD. I reached out to [employer] by phone today in response to a referral sent by [name] [name]'s PCP [name], [name], MD     An unsuccessful telephone outreach was attempted today. The patient was referred to the case management team for assistance with care management and care coordination.   Follow Up Plan: A HIPAA compliant phone message was left for the patient providing contact information and requesting a return call.  If patient returns call to provider office, please advise to call Embedded Care Management Care Guide Honora Searson at [place], [place] Management  Direct Dial: [phone]

## 2021-03-27 NOTE — Chronic Care Management (AMB) (Signed)
  Chronic Care Management   Outreach Note  03/27/2021 Name: [name] [name] MRN: [id number] DOB: 04/03/1941  [name] [name] is a 80 y.o. year old male who is a primary care patient of [name], [name], MD. I reached out to [employer] by phone today in response to a referral sent by [name] [name]'s PCP [name], [name], MD     A second unsuccessful telephone outreach was attempted today. The patient was referred to the case management team for assistance with care management and care coordination.   Follow Up Plan: A HIPAA compliant phone message was left for the patient providing contact information and requesting a return call.  If patient returns call to provider office, please advise to call Embedded Care Management Care Guide Pascal Stiggers at [place], [place] Management  Direct Dial: [phone]

## 2021-04-08 NOTE — Chronic Care Management (AMB) (Signed)
  Chronic Care Management   Outreach Note  04/08/2021 Name: [name] [name] MRN: [id number] DOB: 17-Nov-1940  [name] [name] is a 80 y.o. year old male who is a primary care patient of [name], [name], MD. I reached out to [employer] by phone today in response to a referral sent by [name] [name]'s PCP [name], [name], MD     Third unsuccessful telephone outreach was attempted today. The patient was referred to the case management team for assistance with care management and care coordination. The patient's primary care provider has been notified of our unsuccessful attempts to make or maintain contact with the patient. The care management team is pleased to engage with this patient at any time in the future should he/she be interested in assistance from the care management team.   Follow Up Plan: We have been unable to make contact with the patient for follow up from [place], [place]: [phone]

## 2021-05-04 ENCOUNTER — Encounter: Payer: Self-pay | Admitting: Internal Medicine

## 2021-05-04 ENCOUNTER — Other Ambulatory Visit: Payer: Self-pay

## 2021-05-04 ENCOUNTER — Ambulatory Visit (INDEPENDENT_AMBULATORY_CARE_PROVIDER_SITE_OTHER): Payer: Medicare Other | Admitting: Internal Medicine

## 2021-05-04 DIAGNOSIS — C7951 Secondary malignant neoplasm of bone: Secondary | ICD-10-CM

## 2021-05-04 DIAGNOSIS — I700 Atherosclerosis of aorta: Secondary | ICD-10-CM | POA: Diagnosis not present

## 2021-05-04 DIAGNOSIS — G622 Polyneuropathy due to other toxic agents: Secondary | ICD-10-CM

## 2021-05-04 DIAGNOSIS — I4891 Unspecified atrial fibrillation: Secondary | ICD-10-CM

## 2021-05-04 MED ORDER — VITAMIN D (ERGOCALCIFEROL) 1.25 MG (50000 UNIT) PO CAPS: ORAL_CAPSULE | ORAL | 3 refills | Status: DC

## 2021-05-04 NOTE — Assessment & Plan Note (Addendum)
Stable.  Cont w/Eliquis, Toprol

## 2021-05-04 NOTE — Assessment & Plan Note (Addendum)
MM - f/u w/Dr Irene Limbo. Oxycodone prn for chronic pain. Oxycontin - not covered.  Potential benefits of a long term opioids use as well as potential risks (i.e. addiction risk, apnea etc) and complications (i.e. Somnolence, constipation and others) were explained to the patient and were aknowledged.

## 2021-05-04 NOTE — Assessment & Plan Note (Signed)
  On diet  

## 2021-05-04 NOTE — Assessment & Plan Note (Addendum)
MM Oxycodone and gabapentin prn for chronic pain in the legs/feet Oxycontin - not covered.  Potential benefits of a long term opioids use as well as potential risks (i.e. addiction risk, apnea etc) and complications (i.e. Somnolence, constipation and others) were explained to the patient and were aknowledged. TENS - declined

## 2021-05-04 NOTE — Progress Notes (Signed)
Subjective:  Patient ID: [name] [name], male    DOB: [birth year]  Age: 80 y.o. MRN: [id number]  CC: Follow-up (2 month f/u)   HPI [name] [name] presents for leg cramps, MM, A fib, chronic neuropathy pain f/u.  He is here with his wife.  Pain is 6 out of 10. Feeling better overall  Outpatient Medications Prior to Visit  Medication Sig Dispense Refill   b complex vitamins tablet Take 1 tablet by mouth daily. 100 tablet 3   Calcium-Magnesium 500-250 MG TABS Take 1 tablet by mouth daily.     Carboxymethylcellul-Glycerin (LUBRICATING EYE DROPS OP) Place 1 drop into both eyes daily as needed (dry eyes).     Cholecalciferol (VITAMIN D) 50 MCG (2000 UT) tablet Take 2,000 Units by mouth daily.     diclofenac Sodium (VOLTAREN) 1 % GEL Apply 1 application topically 2 (two) times daily as needed (pain.).     ELIQUIS 5 MG TABS tablet TAKE 1 TABLET BY MOUTH TWICE A DAY 60 tablet 11   gabapentin (NEURONTIN) 300 MG capsule Take 300 mg by mouth at bedtime.     metoprolol succinate (TOPROL-XL) 25 MG 24 hr tablet TAKE 1 TABLET BY MOUTH TWICE A DAY 180 tablet 3   oxyCODONE (OXY IR/ROXICODONE) 5 MG immediate release tablet Take 1-2 tablets (5-10 mg total) by mouth 2 (two) times daily as needed for severe pain. 60 tablet 0   polyethylene glycol powder (GLYCOLAX/MIRALAX) 17 GM/SCOOP powder Take 17-34 g by mouth 2 (two) times daily as needed for moderate constipation. 500 g 5   senna-docusate (SENOKOT-S) 8.6-50 MG tablet Take 2 tablets by mouth at bedtime.     spironolactone (ALDACTONE) 25 MG tablet Take 1 tablet (25 mg total) by mouth daily. 30 tablet 3   Vitamin D, Ergocalciferol, (DRISDOL) 1.25 MG (50000 UNIT) CAPS capsule TAKE 1 CAPSULE BY MOUTH ONE TIME PER WEEK 12 capsule 3   LORazepam (ATIVAN) 0.5 MG tablet Take 1 tablet (0.5 mg total) by mouth every 8 (eight) hours as needed (Nausea or vomiting). (Patient not taking: Reported on 05/04/2021) 60 tablet 2   metroNIDAZOLE (METROCREAM) 0.75 % cream Apply  topically 2 (two) times daily. (Patient not taking: Reported on 05/04/2021) 45 g 1   No facility-administered medications prior to visit.    ROS: Review of Systems  Constitutional:  Negative for appetite change, fatigue and unexpected weight change.  HENT:  Negative for congestion, nosebleeds, sneezing, sore throat and trouble swallowing.   Eyes:  Negative for itching and visual disturbance.  Respiratory:  Negative for cough.   Cardiovascular:  Negative for chest pain, palpitations and leg swelling.  Gastrointestinal:  Negative for abdominal distention, blood in stool, diarrhea and nausea.  Genitourinary:  Negative for frequency and hematuria.  Musculoskeletal:  Positive for gait problem. Negative for back pain, joint swelling and neck pain.  Skin:  Negative for rash.  Neurological:  Negative for dizziness, tremors, speech difficulty and weakness.  Psychiatric/Behavioral:  Negative for agitation, dysphoric mood and sleep disturbance. The patient is not nervous/anxious.    Objective:  BP 130/68 (BP Location: Left Arm)   Pulse 60   Temp 98.2 F (36.8 C) (Oral)   Ht '5\' 11"'$  (1.803 m)   Wt 179 lb 3.2 oz (81.3 kg)   SpO2 97%   BMI 24.99 kg/m   BP Readings from Last 3 Encounters:  05/04/21 130/68  03/04/21 138/80  02/11/21 138/71    Wt Readings from Last 3 Encounters:  05/04/21 179 lb 3.2  oz (81.3 kg)  03/04/21 175 lb 6.4 oz (79.6 kg)  01/23/21 175 lb 6.4 oz (79.6 kg)    Physical Exam Constitutional:      General: He is not in acute distress.    Appearance: He is well-developed.     Comments: NAD  Eyes:     Conjunctiva/sclera: Conjunctivae normal.     Pupils: Pupils are equal, round, and reactive to light.  Neck:     Thyroid: No thyromegaly.     Vascular: No JVD.  Cardiovascular:     Rate and Rhythm: Normal rate and regular rhythm.     Heart sounds: Normal heart sounds. No murmur heard.   No friction rub. No gallop.  Pulmonary:     Effort: Pulmonary effort is normal.  No respiratory distress.     Breath sounds: Normal breath sounds. No wheezing or rales.  Chest:     Chest wall: No tenderness.  Abdominal:     General: Bowel sounds are normal. There is no distension.     Palpations: Abdomen is soft. There is no mass.     Tenderness: There is no abdominal tenderness. There is no guarding or rebound.  Musculoskeletal:        General: Tenderness present. Normal range of motion.     Cervical back: Normal range of motion.  Lymphadenopathy:     Cervical: No cervical adenopathy.  Skin:    General: Skin is warm and dry.     Findings: No rash.  Neurological:     Mental Status: He is alert and oriented to person, place, and time.     Cranial Nerves: No cranial nerve deficit.     Motor: No abnormal muscle tone.     Coordination: Coordination normal.     Gait: Gait normal.     Deep Tendon Reflexes: Reflexes are normal and symmetric.  Psychiatric:        Behavior: Behavior normal.        Thought Content: Thought content normal.        Judgment: Judgment normal.  The patient looks well. Antalgic gait.  He is not using any walking aids  Lab Results  Component Value Date   WBC 5.9 02/11/2021   HGB 13.7 02/11/2021   HCT 42.7 02/11/2021   PLT 175 02/11/2021   GLUCOSE 104 (H) 02/11/2021   CHOL 144 08/01/2018   TRIG 167.0 (H) 08/01/2018   HDL 28.20 (L) 08/01/2018   LDLDIRECT 151.5 11/20/2007   LDLCALC 82 08/01/2018   ALT 29 02/11/2021   AST 25 02/11/2021   NA 146 (H) 02/11/2021   K 4.1 02/11/2021   CL 108 02/11/2021   CREATININE 1.14 02/11/2021   BUN 21 02/11/2021   CO2 24 02/11/2021   TSH 5.338 (H) 06/23/2020   PSA 4.20 (H) 08/01/2018   INR 1.2 01/07/2020   HGBA1C 5.8 (H) 05/04/2019    No results found.  Assessment & Plan:     [name] [name]

## 2021-05-06 ENCOUNTER — Inpatient Hospital Stay (HOSPITAL_BASED_OUTPATIENT_CLINIC_OR_DEPARTMENT_OTHER): Payer: Medicare Other | Admitting: Hematology

## 2021-05-06 ENCOUNTER — Other Ambulatory Visit: Payer: Self-pay

## 2021-05-06 ENCOUNTER — Inpatient Hospital Stay: Payer: Medicare Other

## 2021-05-06 ENCOUNTER — Inpatient Hospital Stay: Payer: Medicare Other | Attending: Hematology

## 2021-05-06 DIAGNOSIS — C7951 Secondary malignant neoplasm of bone: Secondary | ICD-10-CM

## 2021-05-06 DIAGNOSIS — C9000 Multiple myeloma not having achieved remission: Secondary | ICD-10-CM

## 2021-05-06 DIAGNOSIS — Z7189 Other specified counseling: Secondary | ICD-10-CM

## 2021-05-06 DIAGNOSIS — C9001 Multiple myeloma in remission: Secondary | ICD-10-CM | POA: Diagnosis not present

## 2021-05-06 LAB — CMP (CANCER CENTER ONLY)
ALT: 20 U/L (ref 0–44)
AST: 23 U/L (ref 15–41)
Albumin: 4.3 g/dL (ref 3.5–5.0)
Alkaline Phosphatase: 55 U/L (ref 38–126)
Anion gap: 9 (ref 5–15)
BUN: 17 mg/dL (ref 8–23)
CO2: 26 mmol/L (ref 22–32)
Calcium: 9.5 mg/dL (ref 8.9–10.3)
Chloride: 108 mmol/L (ref 98–111)
Creatinine: 1.14 mg/dL (ref 0.61–1.24)
GFR, Estimated: >60 mL/min (ref >60)
Glucose, Bld: 102 mg/dL — ABNORMAL HIGH (ref 70–99)
Potassium: 4.2 mmol/L (ref 3.5–5.1)
Sodium: 143 mmol/L (ref 135–145)
Total Bilirubin: 0.5 mg/dL (ref 0.3–1.2)
Total Protein: 6.3 g/dL — ABNORMAL LOW (ref 6.5–8.1)

## 2021-05-06 LAB — CBC WITH DIFFERENTIAL (CANCER CENTER ONLY)
Abs Immature Granulocytes: 0.03 10*3/uL (ref 0.00–0.07)
Basophils Absolute: 0 10*3/uL (ref 0.0–0.1)
Basophils Relative: 1 %
Eosinophils Absolute: 0.5 10*3/uL (ref 0.0–0.5)
Eosinophils Relative: 7 %
HCT: 40.8 % (ref 39.0–52.0)
Hemoglobin: 13.4 g/dL (ref 13.0–17.0)
Immature Granulocytes: 0 %
Lymphocytes Relative: 18 %
Lymphs Abs: 1.2 10*3/uL (ref 0.7–4.0)
MCH: 30.3 pg (ref 26.0–34.0)
MCHC: 32.8 g/dL (ref 30.0–36.0)
MCV: 92.3 fL (ref 80.0–100.0)
Monocytes Absolute: 0.7 10*3/uL (ref 0.1–1.0)
Monocytes Relative: 10 %
Neutro Abs: 4.5 10*3/uL (ref 1.7–7.7)
Neutrophils Relative %: 64 %
Platelet Count: 149 10*3/uL — ABNORMAL LOW (ref 150–400)
RBC: 4.42 MIL/uL (ref 4.22–5.81)
RDW: 12.9 % (ref 11.5–15.5)
WBC Count: 7 10*3/uL (ref 4.0–10.5)
nRBC: 0 % (ref 0.0–0.2)

## 2021-05-06 MED ORDER — ZOLEDRONIC ACID 4 MG/100ML IV SOLN
4.0000 mg | Freq: Once | INTRAVENOUS | Status: AC
Start: 2021-05-06 — End: 2021-05-06
  Administered 2021-05-06: 4 mg via INTRAVENOUS
  Filled 2021-05-06: qty 100

## 2021-05-06 MED ORDER — SODIUM CHLORIDE 0.9 % IV SOLN: Freq: Once | INTRAVENOUS | Status: AC

## 2021-05-06 NOTE — Patient Instructions (Signed)
Zoledronic Acid Injection (Hypercalcemia, Oncology) What is this medication? ZOLEDRONIC ACID (ZOE le dron ik AS id) slows calcium loss from bones. It high calcium levels in the blood from some kinds of cancer. It may be used in otherpeople at risk for bone loss. This medicine may be used for other purposes; ask your health care provider orpharmacist if you have questions. COMMON BRAND NAME(S): Zometa What should I tell my care team before I take this medication? They need to know if you have any of these conditions: cancer dehydration dental disease kidney disease liver disease low levels of calcium in the blood lung or breathing disease (asthma) receiving steroids like dexamethasone or prednisone an unusual or allergic reaction to zoledronic acid, other medicines, foods, dyes, or preservatives pregnant or trying to get pregnant breast-feeding How should I use this medication? This drug is injected into a vein. It is given by a health care provider in ahospital or clinic setting. Talk to your health care provider about the use of this drug in children.Special care may be needed. Overdosage: If you think you have taken too much of this medicine contact apoison control center or emergency room at once. NOTE: This medicine is only for you. Do not share this medicine with others. What if I miss a dose? Keep appointments for follow-up doses. It is important not to miss your dose.Call your health care provider if you are unable to keep an appointment. What may interact with this medication? certain antibiotics given by injection NSAIDs, medicines for pain and inflammation, like ibuprofen or naproxen some diuretics like bumetanide, furosemide teriparatide thalidomide This list may not describe all possible interactions. Give your health care provider a list of all the medicines, herbs, non-prescription drugs, or dietary supplements you use. Also tell them if you smoke, drink alcohol, or use  illegaldrugs. Some items may interact with your medicine. What should I watch for while using this medication? Visit your health care provider for regular checks on your progress. It may besome time before you see the benefit from this drug. Some people who take this drug have severe bone, joint, or muscle pain. This drug may also increase your risk for jaw problems or a broken thigh bone. Tell your health care provider right away if you have severe pain in your jaw, bones, joints, or muscles. Tell you health care provider if you have any painthat does not go away or that gets worse. Tell your dentist and dental surgeon that you are taking this drug. You should not have major dental surgery while on this drug. See your dentist to have a dental exam and fix any dental problems before starting this drug. Take good care of your teeth while on this drug. Make sure you see your dentist forregular follow-up appointments. You should make sure you get enough calcium and vitamin D while you are taking this drug. Discuss the foods you eat and the vitamins you take with your healthcare provider. Check with your health care provider if you have severe diarrhea, nausea, and vomiting, or if you sweat a lot. The loss of too much body fluid may make itdangerous for you to take this drug. You may need blood work done while you are taking this drug. Do not become pregnant while taking this drug. Women should inform their health care provider if they wish to become pregnant or think they might be pregnant. There is potential for serious harm to an unborn child. Talk to your healthcare provider for more   information. What side effects may I notice from receiving this medication? Side effects that you should report to your doctor or health care provider assoon as possible: allergic reactions (skin rash, itching or hives; swelling of the face, lips, or tongue) bone pain infection (fever, chills, cough, sore throat, pain or  trouble passing urine) jaw pain, especially after dental work joint pain kidney injury (trouble passing urine or change in the amount of urine) low blood pressure (dizziness; feeling faint or lightheaded, falls; unusually weak or tired) low calcium levels (fast heartbeat; muscle cramps or pain; pain, tingling, or numbness in the hands or feet; seizures) low magnesium levels (fast, irregular heartbeat; muscle cramp or pain; muscle weakness; tremors; seizures) low red blood cell counts (trouble breathing; feeling faint; lightheaded, falls; unusually weak or tired) muscle pain redness, blistering, peeling, or loosening of the skin, including inside the mouth severe diarrhea swelling of the ankles, feet, hands trouble breathing Side effects that usually do not require medical attention (report to yourdoctor or health care provider if they continue or are bothersome): anxious constipation coughing depressed mood eye irritation, itching, or pain fever general ill feeling or flu-like symptoms nausea pain, redness, or irritation at site where injected trouble sleeping This list may not describe all possible side effects. Call your doctor for medical advice about side effects. You may report side effects to FDA at1-800-FDA-1088. Where should I keep my medication? This drug is given in a hospital or clinic. It will not be stored at home. NOTE: This sheet is a summary. It may not cover all possible information. If you have questions about this medicine, talk to your doctor, pharmacist, orhealth care provider.  2022 [employer]/Gold Standard (2019-06-14 09:13:00)  

## 2021-05-07 LAB — KAPPA/LAMBDA LIGHT CHAINS
Kappa free light chain: 13.3 mg/L (ref 3.3–19.4)
Kappa, lambda light chain ratio: 1.75 — ABNORMAL HIGH (ref 0.26–1.65)
Lambda free light chains: 7.6 mg/L (ref 5.7–26.3)

## 2021-05-08 ENCOUNTER — Encounter (HOSPITAL_COMMUNITY): Payer: Self-pay | Admitting: Cardiology

## 2021-05-08 ENCOUNTER — Ambulatory Visit (HOSPITAL_COMMUNITY)
Admission: RE | Admit: 2021-05-08 | Discharge: 2021-05-08 | Disposition: A | Discharge status: Home / Self Care | Payer: Medicare Other | Source: Ambulatory Visit | Attending: Internal Medicine | Admitting: Internal Medicine

## 2021-05-08 ENCOUNTER — Ambulatory Visit (HOSPITAL_BASED_OUTPATIENT_CLINIC_OR_DEPARTMENT_OTHER)
Admission: RE | Admit: 2021-05-08 | Discharge: 2021-05-08 | Disposition: A | Discharge status: Home / Self Care | Payer: Medicare Other | Source: Ambulatory Visit | Attending: Cardiology | Admitting: Cardiology

## 2021-05-08 ENCOUNTER — Other Ambulatory Visit: Payer: Self-pay

## 2021-05-08 ENCOUNTER — Telehealth: Payer: Self-pay | Admitting: Hematology

## 2021-05-08 VITALS — BP 120/60 | HR 58 | Wt 180.2 lb

## 2021-05-08 DIAGNOSIS — I480 Paroxysmal atrial fibrillation: Secondary | ICD-10-CM

## 2021-05-08 DIAGNOSIS — G629 Polyneuropathy, unspecified: Secondary | ICD-10-CM | POA: Insufficient documentation

## 2021-05-08 DIAGNOSIS — Z952 Presence of prosthetic heart valve: Secondary | ICD-10-CM

## 2021-05-08 DIAGNOSIS — Z953 Presence of xenogenic heart valve: Secondary | ICD-10-CM | POA: Diagnosis not present

## 2021-05-08 DIAGNOSIS — I5032 Chronic diastolic (congestive) heart failure: Secondary | ICD-10-CM

## 2021-05-08 DIAGNOSIS — Z79899 Other long term (current) drug therapy: Secondary | ICD-10-CM | POA: Diagnosis not present

## 2021-05-08 DIAGNOSIS — Z7901 Long term (current) use of anticoagulants: Secondary | ICD-10-CM | POA: Diagnosis not present

## 2021-05-08 DIAGNOSIS — I110 Hypertensive heart disease with heart failure: Secondary | ICD-10-CM | POA: Insufficient documentation

## 2021-05-08 DIAGNOSIS — I2510 Atherosclerotic heart disease of native coronary artery without angina pectoris: Secondary | ICD-10-CM | POA: Diagnosis not present

## 2021-05-08 DIAGNOSIS — I10 Essential (primary) hypertension: Secondary | ICD-10-CM

## 2021-05-08 DIAGNOSIS — Z791 Long term (current) use of non-steroidal anti-inflammatories (NSAID): Secondary | ICD-10-CM | POA: Diagnosis not present

## 2021-05-08 DIAGNOSIS — C9000 Multiple myeloma not having achieved remission: Secondary | ICD-10-CM | POA: Diagnosis not present

## 2021-05-08 DIAGNOSIS — I350 Nonrheumatic aortic (valve) stenosis: Secondary | ICD-10-CM | POA: Diagnosis not present

## 2021-05-08 DIAGNOSIS — Z888 Allergy status to other drugs, medicaments and biological substances status: Secondary | ICD-10-CM | POA: Insufficient documentation

## 2021-05-08 DIAGNOSIS — Z8249 Family history of ischemic heart disease and other diseases of the circulatory system: Secondary | ICD-10-CM | POA: Diagnosis not present

## 2021-05-08 DIAGNOSIS — E7800 Pure hypercholesterolemia, unspecified: Secondary | ICD-10-CM | POA: Insufficient documentation

## 2021-05-08 DIAGNOSIS — R001 Bradycardia, unspecified: Secondary | ICD-10-CM | POA: Diagnosis not present

## 2021-05-08 DIAGNOSIS — I440 Atrioventricular block, first degree: Secondary | ICD-10-CM | POA: Insufficient documentation

## 2021-05-08 NOTE — Progress Notes (Signed)
Date:  05/08/2021  ID:  [name] [name], DOB 1941-05-30, MRN [id number]  Provider location: [facility] Type of Visit: Established patient   PCP:  [name], [name], MD  Cardiologist: Dr. Aundra Dubin   History of Present Illness: [name] [name] is a 80 y.o. male who has history of HTN, aortic stenosis and paroxysmal atrial fibrillation. He was hospitalized with atrial fibrillation/RVR in 5/14.  He had TEE-guided cardioversion.  TEE showed bicuspid aortic valve with mild AS and a moderately dilated ascending aorta.  Most echo in 4/17 showed moderate aortic stenosis and MRA chest in 11/17 showed 4.1 cm ascending aorta.    He was on Xarelto for anticoagulation but stopped it as he was convinced it was causing joint pains.  He then refused to start any other anticoagulation at that time.     In 5/17, he had been back in atrial fibrillation for several days and was symptomatic.  I started him on Eliquis and planned TEE-guided DCCV given significant symptoms, but he converted back to NSR on his own.  He continued Eliquis for about 1 month then stopped it on his own.     In 11/17, he was hospitalized with symptomatic atrial fibrillation with RVR.  I started him on diltiazem CD and Eliquis with plan for TEE-guided DCCV.  However, he converted back to NSR on his own. He stopped the diltiazem but has continued the Eliquis.    He was admitted in 2/18 with fever, LUL PNA and left-sided pleural effusion.  Thoracentesis on left was suggestive of parapneumonic effusion.  He was in the hospital 11 days.  During that time, he went into atrial fibrillation with RVR.  He was started on amiodarone and went back into NSR.  Amiodarone was subsequently stopped.    Recurrent atrial fibrillation with RVR in 2/19, felt more fatigued.  He underwent TEE-guided DCCV back to NSR.    He had a lower GI bleed in 11/19 post-polypectomy.  He has since restarted on Eliquis.    Coronary CTA was done in  2/20, this showed mild nonobstructive CAD.  Echo in 2/20 showed EF 60-65%, bicuspid aortic valve with severe AS.   LHC in 6/20 showed no significant coronary disease.  In 8/20, he had TAVR with [place] 3 THV x 2 (valve in valve due to peri-valvular leak initially).  Post-procedure echoes have shown elevated gradient across the aortic valve though dimensionless index has only been in the mildly stenotic range.  Last echo in 9/20 showed mean aortic valve gradient 26 mmHg with dimensionless index 0.54.   Patient has additionally been diagnosed with multiple myeloma.  He was treated with Revlimid, Velcade, and prednisone.   He was admitted early in 1/21 with atrial fibrillation/RVR associated with ischemic colitis.  He underwent TEE-guided DCCV to NSR.   He was readmitted later in 1/21 with PNA.    He fell in 4/21, tripped and fractured his left proximal humerus.  He had ORIF in 5/21.   In 7/21, he had a left inguinal hernia repair.  Echo in 8/21 showed EF 60-65%, normal RV, ascending aorta 45 mm, stable TAVR valve with mean gradient 15 mmHg.   Today he returns for HF follow up.Overall feeling fine.Frustrated by cancer treatment. Denies SOB/PND/Orthopnea. Appetite ok. No fever or chills. Weight at home has been stable.  Taking all medications.   Echo today EF 60-65% RV ok.  Bioprosthetic AVR. TAVR mean gradient 13 IVC small.  ECG (personally reviewed): Sinus Brady 57 bpm 1st degree AV block   Labs (5/14): K 3.7, creatinine 0.9, BNP 2261=>109, TSH normal Labs (7/14): K 3.5, creatinine 1.0 Labs (12/14): K 3.8, creatinine 1.0, LDL particle number 1374, LDL 107, TSH normal Labs (6/16): TSH normal, K 3.8, creatinine 0.84, HCT 42.4, LDL 85, LFTs normal Labs (3/17): K 3.8, creatinine 0.87 Labs (5/17): K 4.5, creatinine 0.97, HCT 43.7 Labs (11/17): K 3.2, creatinine 0.81, LDL 71, HDL 24 Labs (2/18): K 3.8, creatinine 0.89 Labs (2/19): K 3.7, creatinine 0.83, hgb 16 Labs (11/19): LDL 82 Labs  (1/20): TSH elevated but free T4 normal, K 4, creatinine 0.85, hgb 13.5 Labs (2/20): TSH mildly elevated but free T4 normal, K 4, creatinine 0.85 Labs (5/20): ESR 48 Labs (9/20): hgb 14.1, plts 38 => 82, K 3.3, creatinine 0.98 => 1.06 Labs (11/20): BNP 126.5 Labs (12/20): hgb 11.9, K 3.2, creatinine 0.87 Labs (1/21): K 4.8, creatinine 1.42, AST 30, ALT 60 Labs (4/21): K 4.5, creatinine 1.22, hgb 12.3, plts 122 Labs (7/21): hgb 13.7, K 4.6, creatinine 1.31, AST 36, ALT 76 Labs (3/22): K 3.7, creatinine 1.05, LFTs normal   Allergies (verified):  No Known Drug Allergies    Past Medical History:  1. Hypertension: ACEI cough. Edema with amlodipine.  2. Atrial fibrillation. The patient had new-onset atrial fibrillation in November 2009. He underwent ibutilide cardioversion successfully. He has had 1-2 episodes/year that are short-lived that likely are atrial fibrillation with RVR. He was admitted in 5/14 with atrial fibrillation/RVR and had TEE-guided DCCV.  Atrial fibrillation again in 5/17, converted back to NSR spontaneously.  CHADSVASC score 2.  - Atrial fibrillation 2/19 with TEE-guided DCCV.  - 1/21 TEE-guided DCCV 3. Hypercholesterolemia.  4. Bicuspid aortic valve disorder: Echo (7/11): EF 55-60%, mild LV hypertrophy, mild aortic stenosis with mean gradient 19 mmHg and peak gradient 36 mmHg.  TEE (5/14): EF 55%, mild LVH, bicuspid aortic valve with mild AS (mean gradient 13 mmHg), ascending aorta 4.5 cm.  Echo (9/15) with EF 65-70%, moderate AS (mean gradient 23 mmHg), mild AI, ascending aorta 4.1 cm, mild MR.   - Echo (4/17) with EF 65-70%, moderate aortic stenosis with mean gradient 27 mmHg, PASP 31 mmHg, ascending aorta 4.4 cm.  - Echo (2/18) with EF 55-60%, bicuspid aortic valve with moderate aortic stenosis (underestimated gradient).  - TEE (2/19): EF 60-65%, moderate LVH, bicuspid aortic valve with moderate AS with mean gradient 28 mmHg and AVA 1.2 cm^2, 4.4 cm ascending aorta.  - Echo  (2/20): EF 60-65%, mild LVH, bicuspid aortic valve with severe AS (mean gradient 48 mmHg, AVA 0.8 cm^2).  - TAVR 8/20 with valve in valve Edwards Sapien 3 THVs (because of peri-valvular leak after initial valve placed).  - Echo (9/20): EF > 65%, mild LVH, mild RV dilation with normal RV systolic function, mean aortic valve gradient 26 mmHg with dimensionless index 0.54, IVC normal.  - TEE (1/21): EF 60-65%, moderate LVH, normal RV, s/p valve-in-valve TAVR with mean gradient 8 mmHg and no PVL.  - Echo (8/21): EF 60-65%, normal RV, ascending aorta 45 mm, stable TAVR valve with mean gradient 15 mmHg.  5. Osteoarthritis.  6. Low back pain.  7. Chest pain: ETT-myoview (12/11) with 10:51 exercise, no chest pain, no significant ST changes, EF 69%, no evidence for ischemia or infarction.  - Coronary CTA (2/20): calcium score 41, nonobstructive CAD.  - LHC (6/20): No significant CAD.  8. Ascending aortic aneurysm: Associated with bicuspid aortic valve.  4.5 cm by TEE in 5/14.   MRA chest (6/14) with bicuspid aortic valve, 4.3 cm ascending aortic aneurysm.  MRA chest (10/15) with 4.2 cm ascending aorta (bicuspid aortic valve noted).  Echo (4/17) with ascending aorta diameter 4.4 cm.  - MRA chest (11/17) with 4.1 cm ascending aorta.  - Echo (2/19): 4.4 cm ascending aorta.  - Coronary CTA in 2/20 showed 4.3 cm ascending aorta.  - Echo (8/21) with 4.5 cm ascending aorta.  9. Colonic polyps: GI bleeding in 11/19 s/p polypectomy.  10. Multiple myeloma.  11. Bradycardia 12. Thrombocytopenia: likely related to chemotherapy for multiple myeloma.  13. PVCs: Zio monitor in 10/20 showed 9.4% PVCs.  14. Chronic diastolic CHF: RHC (23/76) with mean RA 3, PA 22/2, mean PCWP 6, CI 2.74 15. Left inguinal hernia: s/p repair.    Current Outpatient Medications  Medication Sig Dispense Refill   b complex vitamins tablet Take 1 tablet by mouth daily. 100 tablet 3   Calcium-Magnesium 500-250 MG TABS Take 1 tablet by mouth  daily.     Carboxymethylcellul-Glycerin (LUBRICATING EYE DROPS OP) Place 1 drop into both eyes daily as needed (dry eyes).     Cholecalciferol (VITAMIN D) 50 MCG (2000 UT) tablet Take 2,000 Units by mouth daily.     diclofenac Sodium (VOLTAREN) 1 % GEL Apply 1 application topically 2 (two) times daily as needed (pain.).     ELIQUIS 5 MG TABS tablet TAKE 1 TABLET BY MOUTH TWICE A DAY 60 tablet 11   gabapentin (NEURONTIN) 300 MG capsule Take 300 mg by mouth at bedtime.     metoprolol succinate (TOPROL-XL) 25 MG 24 hr tablet TAKE 1 TABLET BY MOUTH TWICE A DAY 180 tablet 3   metroNIDAZOLE (METROCREAM) 0.75 % cream Apply 1 application topically 2 (two) times daily.     oxyCODONE (OXY IR/ROXICODONE) 5 MG immediate release tablet Take 1-2 tablets (5-10 mg total) by mouth 2 (two) times daily as needed for severe pain. 60 tablet 0   polyethylene glycol powder (GLYCOLAX/MIRALAX) 17 GM/SCOOP powder Take 17-34 g by mouth 2 (two) times daily as needed for moderate constipation. 500 g 5   senna-docusate (SENOKOT-S) 8.6-50 MG tablet Take 2 tablets by mouth at bedtime.     spironolactone (ALDACTONE) 25 MG tablet Take 1 tablet (25 mg total) by mouth daily. 30 tablet 3   Vitamin D, Ergocalciferol, (DRISDOL) 1.25 MG (50000 UNIT) CAPS capsule TAKE 1 CAPSULE BY MOUTH ONE TIME PER WEEK 12 capsule 3   No current facility-administered medications for this encounter.    Allergies:   Xarelto [rivaroxaban], Corticosteroids, Ramipril, Zolpidem, and Benazepril   Social History:  The patient  reports that he has never smoked. He has never used smokeless tobacco. He reports that he does not currently use alcohol. He reports that he does not use drugs.   Family History:  The patient's family history includes Colon cancer (age of onset: 21) in his mother; Hypertension in an other family member.   ROS:  Please see the history of present illness.   All other systems are personally reviewed and negative.   Exam:   BP 120/60    Pulse (!) 58   Wt 81.7 kg (180 lb 3.2 oz)   SpO2 96%   BMI 25.13 kg/m  General:  Well appearing. No resp difficulty. Walked in the clinic.  HEENT: normal Neck: supple. no JVD. Carotids 2+ bilat; no bruits. No lymphadenopathy or thryomegaly appreciated. Cor: PMI nondisplaced. Regular rate & rhythm. No  rubs, gallops or murmurs. Lungs: clear Abdomen: soft, nontender, nondistended. No hepatosplenomegaly. No bruits or masses. Good bowel sounds. Extremities: no cyanosis, clubbing, rash, edema Neuro: alert & orientedx3, cranial nerves grossly intact. moves all 4 extremities w/o difficulty. Affect pleasant  EKG: Sinus Brady 57 bpm 1st degree AV block  Recent Labs: 06/23/2020: TSH 5.338 05/06/2021: ALT 20; BUN 17; Creatinine 1.14; Hemoglobin 13.4; Platelet Count 149; Potassium 4.2; Sodium 143  Personally reviewed   Wt Readings from Last 3 Encounters:  05/08/21 81.7 kg (180 lb 3.2 oz)  05/06/21 81.7 kg (180 lb 3.2 oz)  05/04/21 81.3 kg (179 lb 3.2 oz)    ASSESSMENT AND PLAN:  1. Atrial fibrillation: Paroxysmal. He is quite symptomatic when in atrial fibrillation.  CHADSVASC = 3.   In SR today.   - Continue Eliquis.  - He has not wanted atrial fibrillation ablation.   2. Peripheral neuropathy: Patient developed a very painful peripheral neuropathy on Velcade.  This is still very symptomatic.  3. Bradycardia: After TAVR, patient had RBBB and LAFB with HR down to the 40s.  RBBB has resolved and HR is higher.  4. PVCs: Zio monitor in 10/20 showed 9.4% PVCs.  Toprol XL was increased to 25 mg bid, he is not feeling palpitations.   5. HTN: Stable.  - Continue spironolactone 25 mg daily.  6. Bicuspid aortic valve disorder: S/P TAVR with 2 [place] THVs (valve-in-valve due to peri-valvular leak after 1st valve placed). Post-op, mean gradient across the aortic valve was elevated, 26 mmHg in 9/20.  Dimensionless index, in 9/20 0.54, only suggests mild bioprosthetic aortic stenosis.  Echo in  1/21 showed mean gradient down to 10 mmHg and TEE showed mean gradient 8 mmHg with no peri-valvular leakage.  Echo in 8/21 with mean gradient 15 mmHg and 4.5 cm ascending aorta.  - echo today mean gradient 58mm hgb - Follow aorta by echo for now, he would likely be a poor candidate for ascending aorta replacement given frailty and age.  7. Chronic diastolic CHF:  Volume status stable.  He has been off lasix.  8. Multiple myeloma: He is off Revlimid, prednisone, and Velcade.  He appears to have had a good response.    9. Deconditioning: Encouraged increased activity.   I reviewed BMEt and discussed ECHO results. Follow up in 6 months.   Jeanmarie Hubert, NP  05/08/2021  [facility] [address] [facility] [address] [phone] (office) [phone] (fax)

## 2021-05-08 NOTE — Telephone Encounter (Signed)
Scheduled follow-up appointment per 8/24 los. Patient's wife is aware.

## 2021-05-08 NOTE — Patient Instructions (Signed)
EKG done today.  No Labs done today.  No medication changes were made. Please continue all current medications as prescribed.  Your physician recommends that you schedule a follow-up appointment in: 6 months. Please contact our office in January 2023 for a February appointment.   If you have any questions or concerns before your next appointment please send [place] a message through [place] or call our office at [phone].    TO LEAVE A MESSAGE FOR THE NURSE SELECT OPTION 2, PLEASE LEAVE A MESSAGE INCLUDING: YOUR NAME DATE OF BIRTH CALL BACK NUMBER REASON FOR CALL**this is important as we prioritize the call backs  YOU WILL RECEIVE A CALL BACK THE SAME DAY AS LONG AS YOU CALL BEFORE 4:00 PM   Do the following things EVERYDAY: Weigh yourself in the morning before breakfast. Write it down and keep it in a log. Take your medicines as prescribed Eat low salt foods--Limit salt (sodium) to 2000 mg per day.  Stay as active as you can everyday Limit all fluids for the day to less than 2 liters   At the [facility], you and your health needs are our priority. As part of our continuing mission to provide you with exceptional heart care, we have created designated Provider Care Teams. These Care Teams include your primary Cardiologist (physician) and Advanced Practice Providers (APPs- Physician Assistants and Nurse Practitioners) who all work together to provide you with the care you need, when you need it.   You may see any of the following providers on your designated Care Team at your next follow up: Dr Glori Bickers Dr Haynes Kerns, NP Lyda Jester, [place] Audry Riles, PharmD   Please be sure to bring in all your medications bottles to every appointment.

## 2021-05-08 NOTE — Progress Notes (Signed)
  Echocardiogram 2D Echocardiogram has been performed.  [name] [name] 05/08/2021, 2:06 PM

## 2021-05-11 LAB — ECHOCARDIOGRAM COMPLETE
AR max vel: 1.73 cm2
AV Area VTI: 1.94 cm2
AV Area mean vel: 1.77 cm2
AV Mean grad: 12 mmHg
AV Peak grad: 22 mmHg
Ao pk vel: 2.35 m/s
Area-P 1/2: 1.89 cm2
S' Lateral: 2.1 cm

## 2021-05-11 LAB — MULTIPLE MYELOMA PANEL, SERUM
Albumin SerPl Elph-Mcnc: 4 g/dL (ref 2.9–4.4)
Albumin/Glob SerPl: 2 — ABNORMAL HIGH (ref 0.7–1.7)
Alpha 1: 0.2 g/dL (ref 0.0–0.4)
Alpha2 Glob SerPl Elph-Mcnc: 0.6 g/dL (ref 0.4–1.0)
B-Globulin SerPl Elph-Mcnc: 0.9 g/dL (ref 0.7–1.3)
Gamma Glob SerPl Elph-Mcnc: 0.5 g/dL (ref 0.4–1.8)
Globulin, Total: 2.1 g/dL — ABNORMAL LOW (ref 2.2–3.9)
IgA: 64 mg/dL (ref 61–437)
IgG (Immunoglobin G), Serum: 445 mg/dL — ABNORMAL LOW (ref 603–1613)
IgM (Immunoglobulin M), Srm: 45 mg/dL (ref 15–143)
Total Protein ELP: 6.1 g/dL (ref 6.0–8.5)

## 2021-05-11 MED ORDER — OXYCODONE HCL 5 MG PO TABS: 5.0000 mg | ORAL_TABLET | Freq: Two times a day (BID) | ORAL | 0 refills | Status: DC | PRN

## 2021-05-12 ENCOUNTER — Encounter: Payer: Self-pay | Admitting: Hematology

## 2021-05-12 NOTE — Progress Notes (Signed)
HEMATOLOGY/ONCOLOGY CLINIC NOTE  Date of Service: .05/06/2021   Patient Care Team: Tresa Garter, MD as PCP - General Laurey Morale, MD as PCP - Advanced Heart Failure (Cardiology) Laurey Morale, MD as PCP - Cardiology (Cardiology) Maeola Harman, MD as Attending Physician (Neurosurgery) Donzetta Starch, MD as Consulting Physician (Dermatology) Oretha Milch, MD as Consulting Physician (Pulmonary Disease) Glendale Chard, DO as Consulting Physician (Neurology)   CHIEF COMPLAINTS/PURPOSE OF CONSULTATION:   Plasma cell myeloma   HISTORY OF PRESENTING ILLNESS:  [name] [name] is a wonderful 80 y.o. male who has been referred to [place] by Cline Crock, PA for evaluation and management of lytic bone lesions. The pt reports that he is doing well overall.  The pt reports that he has occasional hip pain that radiates down his legs and prevents him from walking. He uses Advil, which helps his hip pain. He is used to exercising often, but has not been able to stay very physically active lately so he is gaining weight. The pt experiences some SOB when he wakes up in the morning.  The pt had a pre-procedural CTA C/A/P before a TAVR completed on 03/14/2019 which revealed "indeterminate osseus lesions in the bony pelvis," which led to an MRI and PET scan. He reports that he has pain when he pushes on his right chest.   He also notes that he had a fall while exercising in 06/2018 and thought he broke his back. He received a blood transfusion on 07/24/2018.   Of note prior to the patient's visit today, the pt has had a MRI pelvis w/wo contrast completed on 03/28/2019 with results revealing "1. Destructive bone lesions as detailed above. Findings most consistent with metastatic disease. PET-CT may be helpful for further evaluation and to establish a primary tumor. The right pelvic bone lesions should be amenable to image guided biopsy but a PET scan may demonstrate easier/safer biopsy sites.  2. No intrapelvic mass or adenopathy. 3. Benign intraosseous lipoma involving the left anterior superior acetabulum."  The pt has also had PET whole body completed on 04/06/2019 with results revealing "1. Diffuse osseous metastatic disease as detailed above without findings for a primary neoplasm in the chest, abdomen or pelvis. The large destructive lesion involving the right ischium should be amenable to image guided biopsy. 2. Two small retroperitoneal lymph nodes and 1 small right obturator node showing hypermetabolism."   Most recent lab results (04/06/2019) of CBC is as follows: all values are WNL.  On review of systems, pt reports hip and leg pain, weight gain and denies syncope and denies belly pain, recent neuropathy and any other symptoms.   On PMHx the pt reports 5 cm hepatic flexure polyp removal, pneumonia, blood transfusion on 07/24/2018  On Social Hx the pt reports that he lives at home with his wife. He is from [place].    INTERVAL HISTORY:  [name] [name] is a wonderful 81 y.o. male who has who is here today for evaluation and management of his Plasma Cell Myeloma. Pt is accompanied by his wife. The patient's last visit with [place] was on 02/11/2021. The pt reports that he is doing well overall.  The pt reports stable and somewhat improved bilateral lower extremity neuropathy.  He is still needing to use pain medications for this on and off. He notes no acute new bone pains  Lab results today .05/06/2021 CBC is unremarkable, CMP stable, myeloma panel shows no M protein and immunofixation is unremarkable.  Serum  kappa lambda free light chains-show no increase light chains.  On review of systems, patient reports no acute new symptoms.  No chest pain or shortness of breath no new abdominal pain. MEDICAL HISTORY:  Past Medical History:  Diagnosis Date   Ascending aortic aneurysm (HCC)    Bicuspid aortic valve    Cancer (HCC)    CHF NYHA class I (no symptoms from ordinary  activities), acute, diastolic ([place])    Dysrhythmia 2009   A fib   Fatty liver    mild   Fracture    left proximal humerus   GERD (gastroesophageal reflux disease)    GI bleeding 07/21/2018   post polypectomy   Hemorrhoids    HTN (hypertension)    Hypercholesteremia    Hypokalemia    Internal hemorrhoids    LBP (low back pain)    Moderate aortic stenosis    Osteoarthritis    Paroxysmal atrial fibrillation ([place])    a. new onset Afib in 07/2008. He underwent ibutilide cardioversion successfully. b. Recurrence 01/2013 s/p TEE/DCCV - was on Xarelto but he stopped it as he was convinced it was causing joint pn. c. Recurrence 01/2016 - spont conv to NSR. Pt took Eliquis x1 mo then declined further anticoag. d. Recurrence 07/2016.   Pneumonia    Tubular adenoma of colon     SURGICAL HISTORY: Past Surgical History:  Procedure Laterality Date   BACK SURGERY  x12 years ago   [place] VALVE REPLACEMENT  2020   CARDIOVERSION N/A 01/26/2013   Procedure: CARDIOVERSION;  Surgeon: Larey Dresser, MD;  Location: [place];  Service: Cardiovascular;  Laterality: N/A;   CARDIOVERSION N/A 10/28/2017   Procedure: CARDIOVERSION;  Surgeon: Larey Dresser, MD;  Location: [place];  Service: Cardiovascular;  Laterality: N/A;   CARDIOVERSION N/A 03/03/2018   Procedure: CARDIOVERSION;  Surgeon: Lelon Perla, MD;  Location: [facility] ENDOSCOPY;  Service: Cardiovascular;  Laterality: N/A;   CARDIOVERSION N/A 09/19/2019   Procedure: CARDIOVERSION;  Surgeon: Larey Dresser, MD;  Location: [facility] ENDOSCOPY;  Service: Cardiovascular;  Laterality: N/A;   COLONOSCOPY     COLONOSCOPY  07/17/2018   at [place] Left 03/18/2020   Procedure: OPEN LEFT INGUINAL HERNIA REPAIR;  Surgeon: Alphonsa Overall, MD;  Location: [place];  Service: General;  Laterality: Left;   LUMBAR LAMINECTOMY     ORIF HUMERUS FRACTURE Left 01/15/2020   Procedure: OPEN  REDUCTION INTERNAL FIXATION (ORIF) PROXIMAL HUMERUS FRACTURE;  Surgeon: Nicholes Stairs, MD;  Location: [place];  Service: Orthopedics;  Laterality: Left;   POLYPECTOMY     RIGHT HEART CATH N/A 08/20/2019   Procedure: RIGHT HEART CATH;  Surgeon: Larey Dresser, MD;  Location: [place] CV LAB;  Service: Cardiovascular;  Laterality: N/A;   RIGHT/LEFT HEART CATH AND CORONARY ANGIOGRAPHY N/A 03/07/2019   Procedure: RIGHT/LEFT HEART CATH AND CORONARY ANGIOGRAPHY;  Surgeon: Burnell Blanks, MD;  Location: [place] CV LAB;  Service: Cardiovascular;  Laterality: N/A;   TAVAR  04/2019   TEE WITHOUT CARDIOVERSION N/A 01/26/2013   Procedure: TRANSESOPHAGEAL ECHOCARDIOGRAM (TEE);  Surgeon: Larey Dresser, MD;  Location: [place];  Service: Cardiovascular;  Laterality: N/A;   TEE WITHOUT CARDIOVERSION N/A 10/28/2017   Procedure: TRANSESOPHAGEAL ECHOCARDIOGRAM (TEE);  Surgeon: Larey Dresser, MD;  Location: [facility] ENDOSCOPY;  Service: Cardiovascular;  Laterality: N/A;   TEE WITHOUT CARDIOVERSION N/A 05/08/2019   Procedure: TRANSESOPHAGEAL ECHOCARDIOGRAM (  TEE);  Surgeon: Burnell Blanks, MD;  Location: [place] CV LAB;  Service: Open Heart Surgery;  Laterality: N/A;   TEE WITHOUT CARDIOVERSION N/A 09/19/2019   Procedure: TRANSESOPHAGEAL ECHOCARDIOGRAM (TEE);  Surgeon: Larey Dresser, MD;  Location: [facility] ENDOSCOPY;  Service: Cardiovascular;  Laterality: N/A;   TRANSCATHETER AORTIC VALVE REPLACEMENT, TRANSFEMORAL N/A 05/08/2019   Procedure: TRANSCATHETER AORTIC VALVE REPLACEMENT, TRANSFEMORAL;  Surgeon: Burnell Blanks, MD;  Location: [place] CV LAB;  Service: Open Heart Surgery;  Laterality: N/A;    SOCIAL HISTORY: Social History   Socioeconomic History   Marital status: Married    Spouse name: Not on file   Number of children: 0   Years of education: Not on file   Highest education level: Not on file  Occupational History   Occupation: Retired Higher education careers adviser: [employer]  Tobacco Use   Smoking status: Never   Smokeless tobacco: Never  Vaping Use   Vaping Use: Never used  Substance and Sexual Activity   Alcohol use: Not Currently   Drug use: No   Sexual activity: Yes  Other Topics Concern   Not on file  Social History Narrative   Patient lives in [place] w/ his wife. He is a native of [place]. 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   Right-handed   Caffeine: 2 cups coffee per day   Two story home   Social Determinants of Health   Financial Resource Strain: Not on file  Food Insecurity: Not on file  Transportation Needs: Not on file  Physical Activity: Not on file  Stress: Not on file  Social Connections: Not on file  Intimate Partner Violence: Not on file    FAMILY HISTORY: Family History  Problem Relation Age of Onset   Colon cancer Mother 41   Hypertension Other    Coronary artery disease Neg Hx    Colon polyps Neg Hx    Esophageal cancer Neg Hx    Rectal cancer Neg Hx    Stomach cancer Neg Hx     ALLERGIES:  is allergic to xarelto [rivaroxaban], corticosteroids, ramipril, zolpidem, and benazepril.  MEDICATIONS:  Current Outpatient Medications  Medication Sig Dispense Refill   b complex vitamins tablet Take 1 tablet by mouth daily. 100 tablet 3   Calcium-Magnesium 500-250 MG TABS Take 1 tablet by mouth daily.     Carboxymethylcellul-Glycerin (LUBRICATING EYE DROPS OP) Place 1 drop into both eyes daily as needed (dry eyes).     Cholecalciferol (VITAMIN D) 50 MCG (2000 UT) tablet Take 2,000 Units by mouth daily.     diclofenac Sodium (VOLTAREN) 1 % GEL Apply 1 application topically 2 (two) times daily as needed (pain.).     ELIQUIS 5 MG TABS tablet TAKE 1 TABLET BY MOUTH TWICE A DAY 60 tablet 11   metoprolol succinate (TOPROL-XL) 25 MG 24 hr tablet TAKE 1 TABLET BY MOUTH TWICE A DAY 180 tablet 3   polyethylene glycol powder (GLYCOLAX/MIRALAX) 17 GM/SCOOP powder  Take 17-34 g by mouth 2 (two) times daily as needed for moderate constipation. 500 g 5   senna-docusate (SENOKOT-S) 8.6-50 MG tablet Take 2 tablets by mouth at bedtime.     spironolactone (ALDACTONE) 25 MG tablet Take 1 tablet (25 mg total) by mouth daily. 30 tablet 3   Vitamin D, Ergocalciferol, (DRISDOL) 1.25 MG (50000 UNIT) CAPS capsule TAKE 1 CAPSULE BY MOUTH ONE TIME PER WEEK 12 capsule 3  gabapentin (NEURONTIN) 300 MG capsule Take 300 mg by mouth at bedtime.     metroNIDAZOLE (METROCREAM) 0.75 % cream Apply 1 application topically 2 (two) times daily.     oxyCODONE (OXY IR/ROXICODONE) 5 MG immediate release tablet Take 1-2 tablets (5-10 mg total) by mouth 2 (two) times daily as needed for severe pain. 60 tablet 0   No current facility-administered medications for this visit.    REVIEW OF SYSTEMS:   .10 Point review of Systems was done is negative except as noted above. PHYSICAL EXAMINATION: Vitals:   05/06/21 1459  BP: 125/65  Pulse: 61  Resp: 18  Temp: 98.2 F (36.8 C)  SpO2: 100%   Wt Readings from Last 3 Encounters:  05/08/21 180 lb 3.2 oz (81.7 kg)  05/06/21 180 lb 3.2 oz (81.7 kg)  05/04/21 179 lb 3.2 oz (81.3 kg)   Body mass index is 25.13 kg/m.    ECOG FS:2 - Symptomatic, <50% confined to bed  Exam was given in infusion. [name] GENERAL:alert, in no acute distress and comfortable SKIN: no acute rashes, no significant lesions EYES: conjunctiva are pink and non-injected, sclera anicteric OROPHARYNX: MMM, no exudates, no oropharyngeal erythema or ulceration NECK: supple, no JVD LYMPH:  no palpable lymphadenopathy in the cervical, axillary or inguinal regions LUNGS: clear to auscultation b/l with normal respiratory effort HEART: regular rate & rhythm ABDOMEN:  normoactive bowel sounds , non tender, not distended. Extremity: no pedal edema PSYCH: alert & oriented x 3 with fluent speech NEURO: no focal motor/sensory deficits    LABORATORY DATA:  I have reviewed  the data as listed  . CBC Latest Ref Rng & Units 05/06/2021 02/11/2021 01/23/2021  WBC 4.0 - 10.5 K/uL 7.0 5.9 5.9  Hemoglobin 13.0 - 17.0 g/dL 13.4 13.7 13.2  Hematocrit 39.0 - 52.0 % 40.8 42.7 41.7  Platelets 150 - 400 K/uL 149(L) 175 169    . CMP Latest Ref Rng & Units 05/06/2021 02/11/2021 01/23/2021  Glucose 70 - 99 mg/dL 102(H) 104(H) 100(H)  BUN 8 - 23 mg/dL $Remove'17 21 21  'DxyQxjw$ Creatinine 0.61 - 1.24 mg/dL 1.14 1.14 1.13  Sodium 135 - 145 mmol/L 143 146(H) 140  Potassium 3.5 - 5.1 mmol/L 4.2 4.1 4.6  Chloride 98 - 111 mmol/L 108 108 108  CO2 22 - 32 mmol/L $RemoveB'26 24 27  'juxwYpia$ Calcium 8.9 - 10.3 mg/dL 9.5 9.8 9.3  Total Protein 6.5 - 8.1 g/dL 6.3(L) 6.8 -  Total Bilirubin 0.3 - 1.2 mg/dL 0.5 0.4 -  Alkaline Phos 38 - 126 U/L 55 59 -  AST 15 - 41 U/L 23 25 -  ALT 0 - 44 U/L 20 29 -   Component     Latest Ref Rng & Units 04/30/2019  Total Protein, Urine-UPE24     Not Estab. mg/dL   Total Protein, Urine-Ur/day     30 - 150 mg/24 hr   ALBUMIN, U     %   ALPHA 1 URINE     %   Alpha 2, Urine     %   % BETA, Urine     %   GAMMA GLOBULIN URINE     %   Free Kappa Lt Chains,Ur     0.63 - 113.79 mg/L   Free Lambda Lt Chains,Ur     0.47 - 11.77 mg/L   Free Kappa/Lambda Ratio     1.03 - 31.76   Immunofixation Result, Urine        Total Volume  M-SPIKE %, Urine     Not Observed %   M-Spike, mg/24 hr     Not Observed mg/24 hr   NOTE:        IgG (Immunoglobin G), Serum     603 - 1,613 mg/dL 1,740 (H)  IgA     61 - 437 mg/dL 16 (L)  IgM (Immunoglobulin M), Srm     15 - 143 mg/dL 9 (L)  Total Protein ELP     6.0 - 8.5 g/dL 7.3  Albumin SerPl Elph-Mcnc     2.9 - 4.4 g/dL 3.8  Alpha 1     0.0 - 0.4 g/dL 0.3  Alpha2 Glob SerPl Elph-Mcnc     0.4 - 1.0 g/dL 0.7  B-Globulin SerPl Elph-Mcnc     0.7 - 1.3 g/dL 0.9  Gamma Glob SerPl Elph-Mcnc     0.4 - 1.8 g/dL 1.6  M Protein SerPl Elph-Mcnc     Not Observed g/dL 1.5 (H)  Globulin, Total     2.2 - 3.9 g/dL 3.5  Albumin/Glob  SerPl     0.7 - 1.7 1.1  IFE 1      Comment  Please Note (HCV):      Comment  Kappa free light chain     3.3 - 19.4 mg/L 821.2 (H)  Lamda free light chains     5.7 - 26.3 mg/L 1.9 (L)  Kappa, lamda light chain ratio     0.26 - 1.65 432.21 (H)  LDH     98 - 192 U/L 132  Sed Rate     0 - 16 mm/hr 3      05/24/2019 Bone Marrow Biopsy    04/16/2019 Surgical Pathology:   RADIOGRAPHIC STUDIES: I have personally reviewed the radiological images as listed and agreed with the findings in the report. ECHOCARDIOGRAM COMPLETE  Result Date: 05/11/2021    ECHOCARDIOGRAM REPORT   Patient Name:   [facility] ([facility] Date of Exam: 05/08/2021 Medical Rec #:  [id number]     Height:       71.0 in Accession #:    [id number]    Weight:       180.2 lb Date of Birth:  30-Oct-1940     BSA:          2.017 m Patient Age:    80 years      BP:           134/69 mmHg Patient Gender: M             HR:           60 bpm. Exam Location:  Outpatient Procedure: 2D Echo Indications:    congestive heart failure  History:        Patient has prior history of Echocardiogram examinations, most                 recent 05/07/2020. CHF, CAD, Arrythmias:Atrial Fibrillation; Risk                 Factors:Hypertension and Dyslipidemia.                 Aortic Valve: 26 mm Edwards Sapien prosthetic, stented (TAVR)                 valve is present in the aortic position. Procedure Date:                 05/08/2019.  Sonographer:    Johny Chess RDCS Referring Phys: [place]  MCLEAN  Sonographer Comments: Suboptimal subcostal window and Technically difficult study due to poor echo windows. Image acquisition challenging due to respiratory motion. IMPRESSIONS  1. Left ventricular ejection fraction, by estimation, is 70 to 75%. The left ventricle has hyperdynamic function. The left ventricle has no regional wall motion abnormalities. Left ventricular diastolic parameters are consistent with Grade II diastolic dysfunction (pseudonormalization).   2. Right ventricular systolic function is normal. The right ventricular size is normal. Tricuspid regurgitation signal is inadequate for assessing PA pressure.  3. Left atrial size was mildly dilated.  4. The mitral valve is degenerative. No evidence of mitral valve regurgitation. No evidence of mitral stenosis.  5. The aortic valve has been repaired/replaced. Aortic valve regurgitation is not visualized. There is a 26 mm Edwards Sapien prosthetic (TAVR) valve present in the aortic position. Procedure Date: 05/08/2019. Echo findings are consistent with normal structure and function of the aortic valve prosthesis. Aortic valve mean gradient measures 12.0 mmHg. Aortic valve Vmax measures 2.35 m/s. Aortic valve acceleration time measures 81 msec.  6. The inferior vena cava is normal in size with greater than 50% respiratory variability, suggesting right atrial pressure of 3 mmHg. FINDINGS  Left Ventricle: Left ventricular ejection fraction, by estimation, is 70 to 75%. The left ventricle has hyperdynamic function. The left ventricle has no regional wall motion abnormalities. The left ventricular internal cavity size was normal in size. There is no left ventricular hypertrophy. Left ventricular diastolic parameters are consistent with Grade II diastolic dysfunction (pseudonormalization). Right Ventricle: The right ventricular size is normal. No increase in right ventricular wall thickness. Right ventricular systolic function is normal. Tricuspid regurgitation signal is inadequate for assessing PA pressure. Left Atrium: Left atrial size was mildly dilated. Right Atrium: Right atrial size was normal in size. Pericardium: There is no evidence of pericardial effusion. Mitral Valve: The mitral valve is degenerative in appearance. Mild to moderate mitral annular calcification. No evidence of mitral valve regurgitation. No evidence of mitral valve stenosis. Tricuspid Valve: The tricuspid valve is normal in structure. Tricuspid  valve regurgitation is not demonstrated. Aortic Valve: The aortic valve has been repaired/replaced. Aortic valve regurgitation is not visualized. Aortic valve mean gradient measures 12.0 mmHg. Aortic valve peak gradient measures 22.0 mmHg. Aortic valve area, by VTI measures 1.94 cm. There is a  26 mm Edwards Sapien prosthetic, stented (TAVR) valve present in the aortic position. Procedure Date: 05/08/2019. Echo findings are consistent with normal structure and function of the aortic valve prosthesis. Pulmonic Valve: The pulmonic valve was not well visualized. Pulmonic valve regurgitation is not visualized. Aorta: The aortic root is normal in size and structure. Venous: The inferior vena cava is normal in size with greater than 50% respiratory variability, suggesting right atrial pressure of 3 mmHg. IAS/Shunts: No atrial level shunt detected by color flow Doppler.  LEFT VENTRICLE PLAX 2D LVIDd:         3.40 cm  Diastology LVIDs:         2.10 cm  LV e' medial:    7.07 cm/s LV PW:         1.00 cm  LV E/e' medial:  11.0 LV IVS:        0.90 cm  LV e' lateral:   8.05 cm/s LVOT diam:     2.00 cm  LV E/e' lateral: 9.6 LV SV:         90 LV SV Index:   44 LVOT Area:     3.14 cm  RIGHT VENTRICLE  RV S prime:     14.50 cm/s TAPSE (M-mode): 2.3 cm LEFT ATRIUM             Index       RIGHT ATRIUM           Index LA diam:        3.90 cm 1.93 cm/m  RA Area:     18.90 cm LA Vol (A2C):   59.2 ml 29.35 ml/m RA Volume:   51.20 ml  25.38 ml/m LA Vol (A4C):   61.1 ml 30.29 ml/m LA Biplane Vol: 60.3 ml 29.89 ml/m  AORTIC VALVE AV Area (Vmax):    1.73 cm AV Area (Vmean):   1.77 cm AV Area (VTI):     1.94 cm AV Vmax:           234.50 cm/s AV Vmean:          158.500 cm/s AV VTI:            0.462 m AV Peak Grad:      22.0 mmHg AV Mean Grad:      12.0 mmHg LVOT Vmax:         129.00 cm/s LVOT Vmean:        89.100 cm/s LVOT VTI:          0.285 m LVOT/AV VTI ratio: 0.62  AORTA Ao Root diam: 3.50 cm MITRAL VALVE MV Area (PHT): 1.89 cm     SHUNTS MV Decel Time: 401 msec    Systemic VTI:  0.29 m MV E velocity: 77.60 cm/s  Systemic Diam: 2.00 cm MV A velocity: 77.00 cm/s MV E/A ratio:  1.01 Mihai Croitoru MD Electronically signed by Sanda Klein MD Signature Date/Time: 05/11/2021/3:16:23 PM    Final     ASSESSMENT & PLAN:   80 yo   #1 Plasma cell myeloma-currently in remission.  03/28/2019 MRI pelvis w/wo contrast revealed "1. Destructive bone lesions as detailed above. Findings most consistent with metastatic disease. PET-CT may be helpful for further evaluation and to establish a primary tumor. The right pelvic bone lesions should be amenable to image guided biopsy but a PET scan may demonstrate easier/safer biopsy sites. 2. No intrapelvic mass or adenopathy. 3. Benign intraosseous lipoma involving the left anterior superior acetabulum."  04/06/2019 PET whole body revealed "1. Diffuse osseous metastatic disease as detailed above without findings for a primary neoplasm in the chest, abdomen or pelvis. The large destructive lesion involving the right ischium should be amenable to image guided biopsy. 2. Two small retroperitoneal lymph nodes and 1 small right obturator node showing hypermetabolism."   04/16/2019 Posterior right pelvis bone biopsy revealed "PLASMA CELL NEOPLASM"  05/24/2019 Bone Marrow Biopsy revealed "BONE MARROW: - CELLULAR MARROW WITH INVOLVEMENT BY PLASMA CELL NEOPLASM (20%) PERIPHERAL BLOOD: - MORPHOLOGICALLY UNREMARKABLE"  05/24/2019 FISH Panel revealed "ABNORMAL result with 11q+, 14q+ and +17"  #2 Severe aortic stenosis with bicuspid aortic valve -10/26/2018 ECHO revealed AVA at 0.8 cm2 and LV EF of 60-65% -05/08/2019 pt had a Transfemoral Transcatheter Aortic Valve Replacement   PLAN: -Discussed pt labwork today, .05/06/2021; blood counts completely normal, chemistries stable. -Myeloma panel shows no M spike and his serum kappa and lambda free light chains are within normal limits. -Based on labs,  symptoms, and clinical exam pt continues to be in remission from Multiple Myeloma at this time. -No indication to restart treatment. Will continue watchful observation.  -Continue Gabapentin.  -Continue Oxycodone prn. -Continue Zometa q20months -Will see back in 3 months  with labs.    FOLLOW UP: Return to clinic with Dr. Irene Limbo with labs and next treatment of Zometa in 3 months  . The total time spent in the appointment was 20 minutes and more than 50% was on counseling and direct patient cares.   The total time spent in the appointment was 20 minutes and more than 50% was on counseling and direct patient cares.   All of the patient's questions were answered with apparent satisfaction. The patient knows to call the clinic with any problems, questions or concerns.   Sullivan Lone MD [place] AAHIVMS [facility] Hematology/Oncology Physician [facility]  (Office):       [phone] (Work cell):  [phone] (Fax):           [phone]

## 2021-06-22 ENCOUNTER — Other Ambulatory Visit: Payer: Self-pay

## 2021-06-22 DIAGNOSIS — C9000 Multiple myeloma not having achieved remission: Secondary | ICD-10-CM

## 2021-06-23 ENCOUNTER — Encounter: Payer: Self-pay | Admitting: Hematology

## 2021-06-23 MED ORDER — OXYCODONE HCL 5 MG PO TABS: 5.0000 mg | ORAL_TABLET | Freq: Two times a day (BID) | ORAL | 0 refills | Status: DC | PRN

## 2021-07-28 ENCOUNTER — Other Ambulatory Visit: Payer: Self-pay

## 2021-07-28 DIAGNOSIS — C9000 Multiple myeloma not having achieved remission: Secondary | ICD-10-CM

## 2021-07-29 ENCOUNTER — Other Ambulatory Visit: Payer: Self-pay

## 2021-07-29 ENCOUNTER — Inpatient Hospital Stay: Payer: Medicare Other | Attending: Hematology | Admitting: Hematology

## 2021-07-29 ENCOUNTER — Inpatient Hospital Stay: Payer: Medicare Other

## 2021-07-29 VITALS — BP 136/68 | HR 59 | Temp 97.2°F | Resp 18 | Wt 182.7 lb

## 2021-07-29 DIAGNOSIS — C9001 Multiple myeloma in remission: Secondary | ICD-10-CM | POA: Insufficient documentation

## 2021-07-29 DIAGNOSIS — C7951 Secondary malignant neoplasm of bone: Secondary | ICD-10-CM | POA: Diagnosis not present

## 2021-07-29 DIAGNOSIS — C9000 Multiple myeloma not having achieved remission: Secondary | ICD-10-CM

## 2021-07-29 DIAGNOSIS — I5031 Acute diastolic (congestive) heart failure: Secondary | ICD-10-CM | POA: Insufficient documentation

## 2021-07-29 DIAGNOSIS — I110 Hypertensive heart disease with heart failure: Secondary | ICD-10-CM | POA: Diagnosis not present

## 2021-07-29 DIAGNOSIS — Z808 Family history of malignant neoplasm of other organs or systems: Secondary | ICD-10-CM | POA: Diagnosis not present

## 2021-07-29 LAB — CBC WITH DIFFERENTIAL (CANCER CENTER ONLY)
Abs Immature Granulocytes: 0.01 10*3/uL (ref 0.00–0.07)
Basophils Absolute: 0 10*3/uL (ref 0.0–0.1)
Basophils Relative: 1 %
Eosinophils Absolute: 0.3 10*3/uL (ref 0.0–0.5)
Eosinophils Relative: 6 %
HCT: 42.7 % (ref 39.0–52.0)
Hemoglobin: 14.1 g/dL (ref 13.0–17.0)
Immature Granulocytes: 0 %
Lymphocytes Relative: 22 %
Lymphs Abs: 1.2 10*3/uL (ref 0.7–4.0)
MCH: 29.9 pg (ref 26.0–34.0)
MCHC: 33 g/dL (ref 30.0–36.0)
MCV: 90.5 fL (ref 80.0–100.0)
Monocytes Absolute: 0.5 10*3/uL (ref 0.1–1.0)
Monocytes Relative: 8 %
Neutro Abs: 3.4 10*3/uL (ref 1.7–7.7)
Neutrophils Relative %: 63 %
Platelet Count: 142 10*3/uL — ABNORMAL LOW (ref 150–400)
RBC: 4.72 MIL/uL (ref 4.22–5.81)
RDW: 12.5 % (ref 11.5–15.5)
WBC Count: 5.4 10*3/uL (ref 4.0–10.5)
nRBC: 0 % (ref 0.0–0.2)

## 2021-07-29 LAB — CMP (CANCER CENTER ONLY)
ALT: 20 U/L (ref 0–44)
AST: 23 U/L (ref 15–41)
Albumin: 4.2 g/dL (ref 3.5–5.0)
Alkaline Phosphatase: 57 U/L (ref 38–126)
Anion gap: 7 (ref 5–15)
BUN: 20 mg/dL (ref 8–23)
CO2: 27 mmol/L (ref 22–32)
Calcium: 9 mg/dL (ref 8.9–10.3)
Chloride: 107 mmol/L (ref 98–111)
Creatinine: 1.04 mg/dL (ref 0.61–1.24)
GFR, Estimated: >60 mL/min (ref >60)
Glucose, Bld: 99 mg/dL (ref 70–99)
Potassium: 3.8 mmol/L (ref 3.5–5.1)
Sodium: 141 mmol/L (ref 135–145)
Total Bilirubin: 0.6 mg/dL (ref 0.3–1.2)
Total Protein: 6.3 g/dL — ABNORMAL LOW (ref 6.5–8.1)

## 2021-07-29 NOTE — Progress Notes (Signed)
HEMATOLOGY/ONCOLOGY CLINIC NOTE  Date of Service: .07/29/2021   Patient Care Team: Tresa Garter, MD as PCP - General Laurey Morale, MD as PCP - Advanced Heart Failure (Cardiology) Laurey Morale, MD as PCP - Cardiology (Cardiology) Maeola Harman, MD as Attending Physician (Neurosurgery) Donzetta Starch, MD as Consulting Physician (Dermatology) Oretha Milch, MD as Consulting Physician (Pulmonary Disease) Glendale Chard, DO as Consulting Physician (Neurology)   CHIEF COMPLAINTS/PURPOSE OF CONSULTATION:   Plasma cell myeloma   HISTORY OF PRESENTING ILLNESS:  [name] [name] is a wonderful 80 y.o. male who has been referred to [place] by Cline Crock, PA for evaluation and management of lytic bone lesions. The pt reports that he is doing well overall.  The pt reports that he has occasional hip pain that radiates down his legs and prevents him from walking. He uses Advil, which helps his hip pain. He is used to exercising often, but has not been able to stay very physically active lately so he is gaining weight. The pt experiences some SOB when he wakes up in the morning.  The pt had a pre-procedural CTA C/A/P before a TAVR completed on 03/14/2019 which revealed "indeterminate osseus lesions in the bony pelvis," which led to an MRI and PET scan. He reports that he has pain when he pushes on his right chest.   He also notes that he had a fall while exercising in 06/2018 and thought he broke his back. He received a blood transfusion on 07/24/2018.   Of note prior to the patient's visit today, the pt has had a MRI pelvis w/wo contrast completed on 03/28/2019 with results revealing "1. Destructive bone lesions as detailed above. Findings most consistent with metastatic disease. PET-CT may be helpful for further evaluation and to establish a primary tumor. The right pelvic bone lesions should be amenable to image guided biopsy but a PET scan may demonstrate easier/safer biopsy sites.  2. No intrapelvic mass or adenopathy. 3. Benign intraosseous lipoma involving the left anterior superior acetabulum."  The pt has also had PET whole body completed on 04/06/2019 with results revealing "1. Diffuse osseous metastatic disease as detailed above without findings for a primary neoplasm in the chest, abdomen or pelvis. The large destructive lesion involving the right ischium should be amenable to image guided biopsy. 2. Two small retroperitoneal lymph nodes and 1 small right obturator node showing hypermetabolism."   Most recent lab results (04/06/2019) of CBC is as follows: all values are WNL.  On review of systems, pt reports hip and leg pain, weight gain and denies syncope and denies belly pain, recent neuropathy and any other symptoms.   On PMHx the pt reports 5 cm hepatic flexure polyp removal, pneumonia, blood transfusion on 07/24/2018  On Social Hx the pt reports that he lives at home with his wife. He is from [place].    INTERVAL HISTORY:  [name] [name] is a wonderful 80 y.o. male who has who is here today for evaluation and management of his Plasma Cell Myeloma. Pt is accompanied by his wife. The patient's last visit with [place] was on 05/06/2021.    He has chronic lower extremity discomfort from neuropathy as well as radiculopathy.  Use oxycodone as needed and gabapentin 300 mg p.o. at bedtime. Notes his neuropathy symptoms are a little more bothersome since it has gotten cold. We discussed increasing his gabapentin to 600 mg at bedtime. We also discussed considering use of PEA as a supplement for his neuropathy  No other acute new focal bone pains  Lab results today .07/29/2021 CBC is unremarkable, CMP stable,  Myeloma panel and serum free light chains pending   MEDICAL HISTORY:  Past Medical History:  Diagnosis Date   Ascending aortic aneurysm (HCC)    Bicuspid aortic valve    Cancer (HCC)    CHF NYHA class I (no symptoms from ordinary activities), acute, diastolic  ([place])    Dysrhythmia 2009   A fib   Fatty liver    mild   Fracture    left proximal humerus   GERD (gastroesophageal reflux disease)    GI bleeding 07/21/2018   post polypectomy   Hemorrhoids    HTN (hypertension)    Hypercholesteremia    Hypokalemia    Internal hemorrhoids    LBP (low back pain)    Moderate aortic stenosis    Osteoarthritis    Paroxysmal atrial fibrillation ([place])    a. new onset Afib in 07/2008. He underwent ibutilide cardioversion successfully. b. Recurrence 01/2013 s/p TEE/DCCV - was on Xarelto but he stopped it as he was convinced it was causing joint pn. c. Recurrence 01/2016 - spont conv to NSR. Pt took Eliquis x1 mo then declined further anticoag. d. Recurrence 07/2016.   Pneumonia    Tubular adenoma of colon     SURGICAL HISTORY: Past Surgical History:  Procedure Laterality Date   BACK SURGERY  x12 years ago   [place] VALVE REPLACEMENT  2020   CARDIOVERSION N/A 01/26/2013   Procedure: CARDIOVERSION;  Surgeon: Larey Dresser, MD;  Location: [facility];  Service: Cardiovascular;  Laterality: N/A;   CARDIOVERSION N/A 10/28/2017   Procedure: CARDIOVERSION;  Surgeon: Larey Dresser, MD;  Location: [facility];  Service: Cardiovascular;  Laterality: N/A;   CARDIOVERSION N/A 03/03/2018   Procedure: CARDIOVERSION;  Surgeon: Lelon Perla, MD;  Location: [facility] ENDOSCOPY;  Service: Cardiovascular;  Laterality: N/A;   CARDIOVERSION N/A 09/19/2019   Procedure: CARDIOVERSION;  Surgeon: Larey Dresser, MD;  Location: [facility] ENDOSCOPY;  Service: Cardiovascular;  Laterality: N/A;   COLONOSCOPY     COLONOSCOPY  07/17/2018   at [facility] Left 03/18/2020   Procedure: OPEN LEFT INGUINAL HERNIA REPAIR;  Surgeon: Alphonsa Overall, MD;  Location: [facility];  Service: General;  Laterality: Left;   LUMBAR LAMINECTOMY     ORIF HUMERUS FRACTURE Left 01/15/2020   Procedure: OPEN REDUCTION INTERNAL FIXATION (ORIF)  PROXIMAL HUMERUS FRACTURE;  Surgeon: Nicholes Stairs, MD;  Location: [place];  Service: Orthopedics;  Laterality: Left;   POLYPECTOMY     RIGHT HEART CATH N/A 08/20/2019   Procedure: RIGHT HEART CATH;  Surgeon: Larey Dresser, MD;  Location: [facility] CV LAB;  Service: Cardiovascular;  Laterality: N/A;   RIGHT/LEFT HEART CATH AND CORONARY ANGIOGRAPHY N/A 03/07/2019   Procedure: RIGHT/LEFT HEART CATH AND CORONARY ANGIOGRAPHY;  Surgeon: Burnell Blanks, MD;  Location: [place] CV LAB;  Service: Cardiovascular;  Laterality: N/A;   TAVAR  04/2019   TEE WITHOUT CARDIOVERSION N/A 01/26/2013   Procedure: TRANSESOPHAGEAL ECHOCARDIOGRAM (TEE);  Surgeon: Larey Dresser, MD;  Location: [facility];  Service: Cardiovascular;  Laterality: N/A;   TEE WITHOUT CARDIOVERSION N/A 10/28/2017   Procedure: TRANSESOPHAGEAL ECHOCARDIOGRAM (TEE);  Surgeon: Larey Dresser, MD;  Location: [facility] ENDOSCOPY;  Service: Cardiovascular;  Laterality: N/A;   TEE WITHOUT CARDIOVERSION N/A 05/08/2019   Procedure: TRANSESOPHAGEAL ECHOCARDIOGRAM (TEE);  Surgeon: Angelena Form,  Annita Brod, MD;  Location: [place] CV LAB;  Service: Open Heart Surgery;  Laterality: N/A;   TEE WITHOUT CARDIOVERSION N/A 09/19/2019   Procedure: TRANSESOPHAGEAL ECHOCARDIOGRAM (TEE);  Surgeon: Larey Dresser, MD;  Location: [facility] ENDOSCOPY;  Service: Cardiovascular;  Laterality: N/A;   TRANSCATHETER AORTIC VALVE REPLACEMENT, TRANSFEMORAL N/A 05/08/2019   Procedure: TRANSCATHETER AORTIC VALVE REPLACEMENT, TRANSFEMORAL;  Surgeon: Burnell Blanks, MD;  Location: [place] CV LAB;  Service: Open Heart Surgery;  Laterality: N/A;    SOCIAL HISTORY: Social History   Socioeconomic History   Marital status: Married    Spouse name: Not on file   Number of children: 0   Years of education: Not on file   Highest education level: Not on file  Occupational History   Occupation: Retired Lobbyist: [employer]  Tobacco  Use   Smoking status: Never   Smokeless tobacco: Never  Vaping Use   Vaping Use: Never used  Substance and Sexual Activity   Alcohol use: Not Currently   Drug use: No   Sexual activity: Yes  Other Topics Concern   Not on file  Social History Narrative   Patient lives in [place] w/ his wife. He is a native of [place]. 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   Right-handed   Caffeine: 2 cups coffee per day   Two story home   Social Determinants of Health   Financial Resource Strain: Not on file  Food Insecurity: Not on file  Transportation Needs: Not on file  Physical Activity: Not on file  Stress: Not on file  Social Connections: Not on file  Intimate Partner Violence: Not on file    FAMILY HISTORY: Family History  Problem Relation Age of Onset   Colon cancer Mother 15   Hypertension Other    Coronary artery disease Neg Hx    Colon polyps Neg Hx    Esophageal cancer Neg Hx    Rectal cancer Neg Hx    Stomach cancer Neg Hx     ALLERGIES:  is allergic to xarelto [rivaroxaban], corticosteroids, ramipril, zolpidem, and benazepril.  MEDICATIONS:  Current Outpatient Medications  Medication Sig Dispense Refill   b complex vitamins tablet Take 1 tablet by mouth daily. 100 tablet 3   Calcium-Magnesium 500-250 MG TABS Take 1 tablet by mouth daily.     Carboxymethylcellul-Glycerin (LUBRICATING EYE DROPS OP) Place 1 drop into both eyes daily as needed (dry eyes).     Cholecalciferol (VITAMIN D) 50 MCG (2000 UT) tablet Take 2,000 Units by mouth daily.     diclofenac Sodium (VOLTAREN) 1 % GEL Apply 1 application topically 2 (two) times daily as needed (pain.).     ELIQUIS 5 MG TABS tablet TAKE 1 TABLET BY MOUTH TWICE A DAY 60 tablet 11   gabapentin (NEURONTIN) 300 MG capsule Take 300 mg by mouth at bedtime.     metoprolol succinate (TOPROL-XL) 25 MG 24 hr tablet TAKE 1 TABLET BY MOUTH TWICE A DAY 180 tablet 3   metroNIDAZOLE (METROCREAM)  0.75 % cream Apply 1 application topically 2 (two) times daily.     oxyCODONE (OXY IR/ROXICODONE) 5 MG immediate release tablet Take 1-2 tablets (5-10 mg total) by mouth 2 (two) times daily as needed for severe pain. 60 tablet 0   polyethylene glycol powder (GLYCOLAX/MIRALAX) 17 GM/SCOOP powder Take 17-34 g by mouth 2 (two) times daily as needed for moderate constipation. 500 g 5  senna-docusate (SENOKOT-S) 8.6-50 MG tablet Take 2 tablets by mouth at bedtime.     spironolactone (ALDACTONE) 25 MG tablet Take 1 tablet (25 mg total) by mouth daily. 30 tablet 3   Vitamin D, Ergocalciferol, (DRISDOL) 1.25 MG (50000 UNIT) CAPS capsule TAKE 1 CAPSULE BY MOUTH ONE TIME PER WEEK 12 capsule 3   No current facility-administered medications for this visit.    REVIEW OF SYSTEMS:   .10 Point review of Systems was done is negative except as noted above.  PHYSICAL EXAMINATION: Vitals:   07/29/21 0855  BP: 136/68  Pulse: (!) 59  Resp: 18  Temp: (!) 97.2 F (36.2 C)  SpO2: 100%   Wt Readings from Last 3 Encounters:  07/29/21 182 lb 11.2 oz (82.9 kg)  05/08/21 180 lb 3.2 oz (81.7 kg)  05/06/21 180 lb 3.2 oz (81.7 kg)   Body mass index is 25.48 kg/m.    ECOG FS:2 - Symptomatic, <50% confined to bed  .10 Point review of Systems was done is negative except as noted above.   LABORATORY DATA:  I have reviewed the data as listed  . CBC Latest Ref Rng & Units 07/29/2021 05/06/2021 02/11/2021  WBC 4.0 - 10.5 K/uL 5.4 7.0 5.9  Hemoglobin 13.0 - 17.0 g/dL 14.1 13.4 13.7  Hematocrit 39.0 - 52.0 % 42.7 40.8 42.7  Platelets 150 - 400 K/uL 142(L) 149(L) 175    . CMP Latest Ref Rng & Units 07/29/2021 05/06/2021 02/11/2021  Glucose 70 - 99 mg/dL 99 102(H) 104(H)  BUN 8 - 23 mg/dL $Remove'20 17 21  'jOtYQOo$ Creatinine 0.61 - 1.24 mg/dL 1.04 1.14 1.14  Sodium 135 - 145 mmol/L 141 143 146(H)  Potassium 3.5 - 5.1 mmol/L 3.8 4.2 4.1  Chloride 98 - 111 mmol/L 107 108 108  CO2 22 - 32 mmol/L $RemoveB'27 26 24  'aDFuEXDY$ Calcium 8.9 - 10.3  mg/dL 9.0 9.5 9.8  Total Protein 6.5 - 8.1 g/dL 6.3(L) 6.3(L) 6.8  Total Bilirubin 0.3 - 1.2 mg/dL 0.6 0.5 0.4  Alkaline Phos 38 - 126 U/L 57 55 59  AST 15 - 41 U/L $Remo'23 23 25  'KItxq$ ALT 0 - 44 U/L $Remo'20 20 29   'GzFhd$ Component     Latest Ref Rng & Units 04/30/2019  Total Protein, Urine-UPE24     Not Estab. mg/dL   Total Protein, Urine-Ur/day     30 - 150 mg/24 hr   ALBUMIN, U     %   ALPHA 1 URINE     %   Alpha 2, Urine     %   % BETA, Urine     %   GAMMA GLOBULIN URINE     %   Free Kappa Lt Chains,Ur     0.63 - 113.79 mg/L   Free Lambda Lt Chains,Ur     0.47 - 11.77 mg/L   Free Kappa/Lambda Ratio     1.03 - 31.76   Immunofixation Result, Urine        Total Volume        M-SPIKE %, Urine     Not Observed %   M-Spike, mg/24 hr     Not Observed mg/24 hr   NOTE:        IgG (Immunoglobin G), Serum     603 - 1,613 mg/dL 1,740 (H)  IgA     61 - 437 mg/dL 16 (L)  IgM (Immunoglobulin M), Srm     15 - 143 mg/dL 9 (L)  Total Protein ELP  6.0 - 8.5 g/dL 7.3  Albumin SerPl Elph-Mcnc     2.9 - 4.4 g/dL 3.8  Alpha 1     0.0 - 0.4 g/dL 0.3  Alpha2 Glob SerPl Elph-Mcnc     0.4 - 1.0 g/dL 0.7  B-Globulin SerPl Elph-Mcnc     0.7 - 1.3 g/dL 0.9  Gamma Glob SerPl Elph-Mcnc     0.4 - 1.8 g/dL 1.6  M Protein SerPl Elph-Mcnc     Not Observed g/dL 1.5 (H)  Globulin, Total     2.2 - 3.9 g/dL 3.5  Albumin/Glob SerPl     0.7 - 1.7 1.1  IFE 1      Comment  Please Note (HCV):      Comment  Kappa free light chain     3.3 - 19.4 mg/L 821.2 (H)  Lamda free light chains     5.7 - 26.3 mg/L 1.9 (L)  Kappa, lamda light chain ratio     0.26 - 1.65 432.21 (H)  LDH     98 - 192 U/L 132  Sed Rate     0 - 16 mm/hr 3      05/24/2019 Bone Marrow Biopsy    04/16/2019 Surgical Pathology:   RADIOGRAPHIC STUDIES: I have personally reviewed the radiological images as listed and agreed with the findings in the report. No results found.  ASSESSMENT & PLAN:   80 yo   #1 Plasma cell  myeloma-currently in remission.  03/28/2019 MRI pelvis w/wo contrast revealed "1. Destructive bone lesions as detailed above. Findings most consistent with metastatic disease. PET-CT may be helpful for further evaluation and to establish a primary tumor. The right pelvic bone lesions should be amenable to image guided biopsy but a PET scan may demonstrate easier/safer biopsy sites. 2. No intrapelvic mass or adenopathy. 3. Benign intraosseous lipoma involving the left anterior superior acetabulum."  04/06/2019 PET whole body revealed "1. Diffuse osseous metastatic disease as detailed above without findings for a primary neoplasm in the chest, abdomen or pelvis. The large destructive lesion involving the right ischium should be amenable to image guided biopsy. 2. Two small retroperitoneal lymph nodes and 1 small right obturator node showing hypermetabolism."   04/16/2019 Posterior right pelvis bone biopsy revealed "PLASMA CELL NEOPLASM"  05/24/2019 Bone Marrow Biopsy revealed "BONE MARROW: - CELLULAR MARROW WITH INVOLVEMENT BY PLASMA CELL NEOPLASM (20%) PERIPHERAL BLOOD: - MORPHOLOGICALLY UNREMARKABLE"  05/24/2019 FISH Panel revealed "ABNORMAL result with 11q+, 14q+ and +17"  #2 Severe aortic stenosis with bicuspid aortic valve -10/26/2018 ECHO revealed AVA at 0.8 cm2 and LV EF of 60-65% -05/08/2019 pt had a Transfemoral Transcatheter Aortic Valve Replacement   PLAN: -Discussed pt labwork today, .07/29/2021; blood counts completely normal, chemistries stable. -Myeloma panel and serum free light chains pending -Based on labs, symptoms, and clinical exam pt continues to be in remission from Multiple Myeloma at this time. -We will follow-up on his myeloma panel and serum free light chains. -No indication to restart treatment. Will continue watchful observation.  -Continue Gabapentin increase dose to 600 mg p.o. at bedtime.  -Continue Oxycodone prn for neuropathy pain -Continue Zometa  q29months. -Encourage patient to get his flu shot and his new COVID-19 booster vaccine.   FOLLOW UP: -Please schedule Zometa every 3 months x 4 doses next dose is due soon after Thanksgiving -Return to clinic with Dr. Irene Limbo with labs in 4 months  . The total time spent in the appointment was 20 minutes and more than 50% was on counseling  and direct patient cares.  All of the patient's questions were answered with apparent satisfaction. The patient knows to call the clinic with any problems, questions or concerns.   Sullivan Lone MD [place] AAHIVMS [facility] Hematology/Oncology Physician [facility]

## 2021-07-30 LAB — KAPPA/LAMBDA LIGHT CHAINS
Kappa free light chain: 16.8 mg/L (ref 3.3–19.4)
Kappa, lambda light chain ratio: 2.24 — ABNORMAL HIGH (ref 0.26–1.65)
Lambda free light chains: 7.5 mg/L (ref 5.7–26.3)

## 2021-07-31 ENCOUNTER — Encounter: Payer: Self-pay | Admitting: Hematology

## 2021-07-31 MED ORDER — OXYCODONE HCL 5 MG PO TABS: 5.0000 mg | ORAL_TABLET | Freq: Two times a day (BID) | ORAL | 0 refills | Status: DC | PRN

## 2021-08-03 LAB — MULTIPLE MYELOMA PANEL, SERUM
Albumin SerPl Elph-Mcnc: 4.2 g/dL (ref 2.9–4.4)
Albumin/Glob SerPl: 2 — ABNORMAL HIGH (ref 0.7–1.7)
Alpha 1: 0.2 g/dL (ref 0.0–0.4)
Alpha2 Glob SerPl Elph-Mcnc: 0.6 g/dL (ref 0.4–1.0)
B-Globulin SerPl Elph-Mcnc: 0.9 g/dL (ref 0.7–1.3)
Gamma Glob SerPl Elph-Mcnc: 0.5 g/dL (ref 0.4–1.8)
Globulin, Total: 2.2 g/dL (ref 2.2–3.9)
IgA: 80 mg/dL (ref 61–437)
IgG (Immunoglobin G), Serum: 491 mg/dL — ABNORMAL LOW (ref 603–1613)
IgM (Immunoglobulin M), Srm: 52 mg/dL (ref 15–143)
M Protein SerPl Elph-Mcnc: 0.2 g/dL — ABNORMAL HIGH
Total Protein ELP: 6.4 g/dL (ref 6.0–8.5)

## 2021-08-04 DIAGNOSIS — Z23 Encounter for immunization: Secondary | ICD-10-CM | POA: Diagnosis not present

## 2021-08-17 ENCOUNTER — Telehealth: Payer: Self-pay

## 2021-08-17 NOTE — Telephone Encounter (Signed)
Per Dr Irene Limbo contacted pt's wife. : let her know the patient's  M spike has become detectable would recommend rpt labs in 2 months cbc/diff, cmp myeloma panel, serum free light chains in 2 months and phone visit 1 week after labs.  Wife acknowledged and verbalized understanding.

## 2021-08-21 ENCOUNTER — Inpatient Hospital Stay: Payer: Medicare Other | Attending: Hematology

## 2021-08-21 ENCOUNTER — Other Ambulatory Visit: Payer: Self-pay

## 2021-08-21 ENCOUNTER — Other Ambulatory Visit: Payer: Medicare Other

## 2021-08-21 VITALS — BP 160/76 | HR 52 | Temp 98.2°F | Resp 18

## 2021-08-21 DIAGNOSIS — C9001 Multiple myeloma in remission: Secondary | ICD-10-CM | POA: Insufficient documentation

## 2021-08-21 DIAGNOSIS — C9000 Multiple myeloma not having achieved remission: Secondary | ICD-10-CM

## 2021-08-21 DIAGNOSIS — C7951 Secondary malignant neoplasm of bone: Secondary | ICD-10-CM

## 2021-08-21 DIAGNOSIS — Z7189 Other specified counseling: Secondary | ICD-10-CM

## 2021-08-21 MED ORDER — ZOLEDRONIC ACID 4 MG/100ML IV SOLN
4.0000 mg | Freq: Once | INTRAVENOUS | Status: AC
  Administered 2021-08-21: 4 mg via INTRAVENOUS
  Filled 2021-08-21: qty 100

## 2021-08-21 NOTE — Patient Instructions (Signed)

## 2021-09-10 ENCOUNTER — Ambulatory Visit: Payer: Medicare Other | Admitting: Hematology

## 2021-09-22 DIAGNOSIS — H524 Presbyopia: Secondary | ICD-10-CM | POA: Diagnosis not present

## 2021-09-22 DIAGNOSIS — H33302 Unspecified retinal break, left eye: Secondary | ICD-10-CM | POA: Diagnosis not present

## 2021-09-22 DIAGNOSIS — H25013 Cortical age-related cataract, bilateral: Secondary | ICD-10-CM | POA: Diagnosis not present

## 2021-09-22 DIAGNOSIS — H2513 Age-related nuclear cataract, bilateral: Secondary | ICD-10-CM | POA: Diagnosis not present

## 2021-09-23 DIAGNOSIS — H33323 Round hole, bilateral: Secondary | ICD-10-CM | POA: Diagnosis not present

## 2021-09-23 DIAGNOSIS — H43811 Vitreous degeneration, right eye: Secondary | ICD-10-CM | POA: Diagnosis not present

## 2021-09-23 DIAGNOSIS — H33193 Other retinoschisis and retinal cysts, bilateral: Secondary | ICD-10-CM | POA: Diagnosis not present

## 2021-09-23 DIAGNOSIS — H43822 Vitreomacular adhesion, left eye: Secondary | ICD-10-CM | POA: Diagnosis not present

## 2021-09-30 ENCOUNTER — Inpatient Hospital Stay: Payer: Medicare Other | Attending: Hematology

## 2021-09-30 ENCOUNTER — Other Ambulatory Visit: Payer: Self-pay

## 2021-09-30 DIAGNOSIS — C9001 Multiple myeloma in remission: Secondary | ICD-10-CM | POA: Insufficient documentation

## 2021-09-30 DIAGNOSIS — C7951 Secondary malignant neoplasm of bone: Secondary | ICD-10-CM | POA: Diagnosis not present

## 2021-09-30 DIAGNOSIS — Z800 Family history of malignant neoplasm of digestive organs: Secondary | ICD-10-CM | POA: Insufficient documentation

## 2021-09-30 DIAGNOSIS — I110 Hypertensive heart disease with heart failure: Secondary | ICD-10-CM | POA: Insufficient documentation

## 2021-09-30 DIAGNOSIS — I509 Heart failure, unspecified: Secondary | ICD-10-CM | POA: Diagnosis not present

## 2021-09-30 DIAGNOSIS — C9000 Multiple myeloma not having achieved remission: Secondary | ICD-10-CM

## 2021-09-30 DIAGNOSIS — I350 Nonrheumatic aortic (valve) stenosis: Secondary | ICD-10-CM | POA: Insufficient documentation

## 2021-09-30 LAB — CMP (CANCER CENTER ONLY)
ALT: 20 U/L (ref 0–44)
AST: 23 U/L (ref 15–41)
Albumin: 4.5 g/dL (ref 3.5–5.0)
Alkaline Phosphatase: 53 U/L (ref 38–126)
Anion gap: 6 (ref 5–15)
BUN: 20 mg/dL (ref 8–23)
CO2: 29 mmol/L (ref 22–32)
Calcium: 9.5 mg/dL (ref 8.9–10.3)
Chloride: 107 mmol/L (ref 98–111)
Creatinine: 1.11 mg/dL (ref 0.61–1.24)
GFR, Estimated: >60 mL/min (ref >60)
Glucose, Bld: 100 mg/dL — ABNORMAL HIGH (ref 70–99)
Potassium: 3.9 mmol/L (ref 3.5–5.1)
Sodium: 142 mmol/L (ref 135–145)
Total Bilirubin: 0.5 mg/dL (ref 0.3–1.2)
Total Protein: 6.6 g/dL (ref 6.5–8.1)

## 2021-09-30 LAB — CBC WITH DIFFERENTIAL (CANCER CENTER ONLY)
Abs Immature Granulocytes: 0.03 10*3/uL (ref 0.00–0.07)
Basophils Absolute: 0 10*3/uL (ref 0.0–0.1)
Basophils Relative: 1 %
Eosinophils Absolute: 0.4 10*3/uL (ref 0.0–0.5)
Eosinophils Relative: 7 %
HCT: 45.4 % (ref 39.0–52.0)
Hemoglobin: 14.8 g/dL (ref 13.0–17.0)
Immature Granulocytes: 1 %
Lymphocytes Relative: 21 %
Lymphs Abs: 1.3 10*3/uL (ref 0.7–4.0)
MCH: 29.7 pg (ref 26.0–34.0)
MCHC: 32.6 g/dL (ref 30.0–36.0)
MCV: 91.2 fL (ref 80.0–100.0)
Monocytes Absolute: 0.5 10*3/uL (ref 0.1–1.0)
Monocytes Relative: 8 %
Neutro Abs: 3.9 10*3/uL (ref 1.7–7.7)
Neutrophils Relative %: 62 %
Platelet Count: 157 10*3/uL (ref 150–400)
RBC: 4.98 MIL/uL (ref 4.22–5.81)
RDW: 12.8 % (ref 11.5–15.5)
WBC Count: 6.2 10*3/uL (ref 4.0–10.5)
nRBC: 0 % (ref 0.0–0.2)

## 2021-10-01 LAB — KAPPA/LAMBDA LIGHT CHAINS
Kappa free light chain: 22.7 mg/L — ABNORMAL HIGH (ref 3.3–19.4)
Kappa, lambda light chain ratio: 3.11 — ABNORMAL HIGH (ref 0.26–1.65)
Lambda free light chains: 7.3 mg/L (ref 5.7–26.3)

## 2021-10-05 ENCOUNTER — Ambulatory Visit: Payer: Medicare Other | Admitting: Internal Medicine

## 2021-10-05 LAB — MULTIPLE MYELOMA PANEL, SERUM
Albumin SerPl Elph-Mcnc: 3.8 g/dL (ref 2.9–4.4)
Albumin/Glob SerPl: 1.6 (ref 0.7–1.7)
Alpha 1: 0.2 g/dL (ref 0.0–0.4)
Alpha2 Glob SerPl Elph-Mcnc: 0.6 g/dL (ref 0.4–1.0)
B-Globulin SerPl Elph-Mcnc: 0.9 g/dL (ref 0.7–1.3)
Gamma Glob SerPl Elph-Mcnc: 0.7 g/dL (ref 0.4–1.8)
Globulin, Total: 2.4 g/dL (ref 2.2–3.9)
IgA: 73 mg/dL (ref 61–437)
IgG (Immunoglobin G), Serum: 529 mg/dL — ABNORMAL LOW (ref 603–1613)
IgM (Immunoglobulin M), Srm: 56 mg/dL (ref 15–143)
M Protein SerPl Elph-Mcnc: 0.3 g/dL — ABNORMAL HIGH
Total Protein ELP: 6.2 g/dL (ref 6.0–8.5)

## 2021-10-07 ENCOUNTER — Other Ambulatory Visit: Payer: Self-pay

## 2021-10-07 ENCOUNTER — Inpatient Hospital Stay (HOSPITAL_BASED_OUTPATIENT_CLINIC_OR_DEPARTMENT_OTHER): Payer: Medicare Other | Admitting: Hematology

## 2021-10-07 ENCOUNTER — Other Ambulatory Visit (HOSPITAL_COMMUNITY): Payer: Self-pay | Admitting: Cardiology

## 2021-10-07 VITALS — BP 137/68 | HR 62 | Temp 97.9°F | Resp 18 | Ht 71.0 in | Wt 185.0 lb

## 2021-10-07 DIAGNOSIS — Z800 Family history of malignant neoplasm of digestive organs: Secondary | ICD-10-CM | POA: Diagnosis not present

## 2021-10-07 DIAGNOSIS — I509 Heart failure, unspecified: Secondary | ICD-10-CM | POA: Diagnosis not present

## 2021-10-07 DIAGNOSIS — G629 Polyneuropathy, unspecified: Secondary | ICD-10-CM

## 2021-10-07 DIAGNOSIS — C9000 Multiple myeloma not having achieved remission: Secondary | ICD-10-CM

## 2021-10-07 DIAGNOSIS — C9001 Multiple myeloma in remission: Secondary | ICD-10-CM | POA: Diagnosis not present

## 2021-10-07 DIAGNOSIS — I110 Hypertensive heart disease with heart failure: Secondary | ICD-10-CM | POA: Diagnosis not present

## 2021-10-07 DIAGNOSIS — I350 Nonrheumatic aortic (valve) stenosis: Secondary | ICD-10-CM | POA: Diagnosis not present

## 2021-10-07 DIAGNOSIS — C7951 Secondary malignant neoplasm of bone: Secondary | ICD-10-CM | POA: Diagnosis not present

## 2021-10-12 ENCOUNTER — Other Ambulatory Visit: Payer: Self-pay

## 2021-10-12 ENCOUNTER — Encounter: Payer: Self-pay | Admitting: Internal Medicine

## 2021-10-12 ENCOUNTER — Ambulatory Visit (INDEPENDENT_AMBULATORY_CARE_PROVIDER_SITE_OTHER): Payer: Medicare Other | Admitting: Internal Medicine

## 2021-10-12 DIAGNOSIS — R269 Unspecified abnormalities of gait and mobility: Secondary | ICD-10-CM

## 2021-10-12 DIAGNOSIS — C7951 Secondary malignant neoplasm of bone: Secondary | ICD-10-CM | POA: Diagnosis not present

## 2021-10-12 DIAGNOSIS — C9000 Multiple myeloma not having achieved remission: Secondary | ICD-10-CM | POA: Diagnosis not present

## 2021-10-12 DIAGNOSIS — I10 Essential (primary) hypertension: Secondary | ICD-10-CM

## 2021-10-12 DIAGNOSIS — G622 Polyneuropathy due to other toxic agents: Secondary | ICD-10-CM

## 2021-10-12 DIAGNOSIS — J310 Chronic rhinitis: Secondary | ICD-10-CM | POA: Insufficient documentation

## 2021-10-12 MED ORDER — OXYCODONE HCL 5 MG PO TABS: 5.0000 mg | ORAL_TABLET | Freq: Two times a day (BID) | ORAL | 0 refills | Status: DC | PRN

## 2021-10-12 MED ORDER — IPRATROPIUM BROMIDE 0.06 % NA SOLN: 2.0000 | Freq: Three times a day (TID) | NASAL | 2 refills | Status: DC

## 2021-10-12 NOTE — Assessment & Plan Note (Addendum)
MM relapsed F/u w/Dr Irene Limbo Oxycodone prn for chronic pain  Potential benefits of a long term opioids use as well as potential risks (i.e. addiction risk, apnea etc) and complications (i.e. Somnolence, constipation and others) were explained to the patient and were aknowledged.

## 2021-10-12 NOTE — Assessment & Plan Note (Signed)
Cont to walk

## 2021-10-12 NOTE — Assessment & Plan Note (Signed)
Cont on Toprol, spironolactone, potassium, amlodipine 

## 2021-10-12 NOTE — Progress Notes (Addendum)
Subjective:  Patient ID: [name] [name], male    DOB: 1941-05-21  Age: 81 y.o. MRN: [id number]  CC: Follow-up   HPI [name] [name] presents for weakness, MM, LE neuropathy; chronic pain C/o runny nose  Outpatient Medications Prior to Visit  Medication Sig Dispense Refill   b complex vitamins tablet Take 1 tablet by mouth daily. 100 tablet 3   Calcium-Magnesium 500-250 MG TABS Take 1 tablet by mouth daily.     Carboxymethylcellul-Glycerin (LUBRICATING EYE DROPS OP) Place 1 drop into both eyes daily as needed (dry eyes).     Cholecalciferol (VITAMIN D) 50 MCG (2000 UT) tablet Take 2,000 Units by mouth daily.     diclofenac Sodium (VOLTAREN) 1 % GEL Apply 1 application topically 2 (two) times daily as needed (pain.).     ELIQUIS 5 MG TABS tablet TAKE 1 TABLET BY MOUTH TWICE A DAY 60 tablet 11   gabapentin (NEURONTIN) 300 MG capsule Take 300 mg by mouth at bedtime.     metoprolol succinate (TOPROL-XL) 25 MG 24 hr tablet TAKE 1 TABLET BY MOUTH TWICE A DAY 180 tablet 3   metroNIDAZOLE (METROCREAM) 0.75 % cream Apply 1 application topically 2 (two) times daily.     polyethylene glycol powder (GLYCOLAX/MIRALAX) 17 GM/SCOOP powder Take 17-34 g by mouth 2 (two) times daily as needed for moderate constipation. 500 g 5   senna-docusate (SENOKOT-S) 8.6-50 MG tablet Take 2 tablets by mouth at bedtime.     spironolactone (ALDACTONE) 25 MG tablet TAKE 1 TABLET BY MOUTH EVERY DAY 90 tablet 3   Vitamin D, Ergocalciferol, (DRISDOL) 1.25 MG (50000 UNIT) CAPS capsule TAKE 1 CAPSULE BY MOUTH ONE TIME PER WEEK 12 capsule 3   oxyCODONE (OXY IR/ROXICODONE) 5 MG immediate release tablet Take 1-2 tablets (5-10 mg total) by mouth 2 (two) times daily as needed for severe pain. 60 tablet 0   No facility-administered medications prior to visit.    ROS: Review of Systems  Constitutional:  Positive for fatigue. Negative for appetite change and unexpected weight change.  HENT:  Negative for congestion, nosebleeds,  sneezing, sore throat and trouble swallowing.   Eyes:  Negative for itching and visual disturbance.  Respiratory:  Negative for cough.   Cardiovascular:  Negative for chest pain, palpitations and leg swelling.  Gastrointestinal:  Negative for abdominal distention, blood in stool, diarrhea and nausea.  Genitourinary:  Negative for frequency and hematuria.  Musculoskeletal:  Positive for arthralgias and gait problem. Negative for back pain, joint swelling and neck pain.  Skin:  Negative for rash.  Neurological:  Negative for dizziness, tremors, speech difficulty and weakness.  Psychiatric/Behavioral:  Positive for dysphoric mood. Negative for agitation and sleep disturbance. The patient is not nervous/anxious.    Objective:  BP 138/72 (BP Location: Left Arm)    Pulse (!) 59    Temp 97.8 F (36.6 C) (Oral)    Ht 5' 11" (1.803 m)    Wt 185 lb 6.4 oz (84.1 kg)    SpO2 98%    BMI 25.86 kg/m   BP Readings from Last 3 Encounters:  10/12/21 138/72  10/07/21 137/68  08/21/21 (!) 160/76    Wt Readings from Last 3 Encounters:  10/12/21 185 lb 6.4 oz (84.1 kg)  10/07/21 185 lb (83.9 kg)  07/29/21 182 lb 11.2 oz (82.9 kg)    Physical Exam Constitutional:      General: He is not in acute distress.    Appearance: He is well-developed.  Comments: NAD  Eyes:     Conjunctiva/sclera: Conjunctivae normal.     Pupils: Pupils are equal, round, and reactive to light.  Neck:     Thyroid: No thyromegaly.     Vascular: No JVD.  Cardiovascular:     Rate and Rhythm: Normal rate and regular rhythm.     Heart sounds: Normal heart sounds. No murmur heard.   No friction rub. No gallop.  Pulmonary:     Effort: Pulmonary effort is normal. No respiratory distress.     Breath sounds: Normal breath sounds. No wheezing or rales.  Chest:     Chest wall: No tenderness.  Abdominal:     General: Bowel sounds are normal. There is no distension.     Palpations: Abdomen is soft. There is no mass.      Tenderness: There is no abdominal tenderness. There is no guarding or rebound.  Musculoskeletal:        General: No tenderness. Normal range of motion.     Cervical back: Normal range of motion.  Lymphadenopathy:     Cervical: No cervical adenopathy.  Skin:    General: Skin is warm and dry.     Findings: No rash.  Neurological:     Mental Status: He is alert and oriented to person, place, and time.     Cranial Nerves: No cranial nerve deficit.     Motor: No abnormal muscle tone.     Coordination: Coordination normal.     Gait: Gait normal.     Deep Tendon Reflexes: Reflexes are normal and symmetric.  Psychiatric:        Behavior: Behavior normal.        Thought Content: Thought content normal.        Judgment: Judgment normal.    Lab Results  Component Value Date   WBC 6.2 09/30/2021   HGB 14.8 09/30/2021   HCT 45.4 09/30/2021   PLT 157 09/30/2021   GLUCOSE 100 (H) 09/30/2021   CHOL 144 08/01/2018   TRIG 167.0 (H) 08/01/2018   HDL 28.20 (L) 08/01/2018   LDLDIRECT 151.5 11/20/2007   LDLCALC 82 08/01/2018   ALT 20 09/30/2021   AST 23 09/30/2021   NA 142 09/30/2021   K 3.9 09/30/2021   CL 107 09/30/2021   CREATININE 1.11 09/30/2021   BUN 20 09/30/2021   CO2 29 09/30/2021   TSH 5.338 (H) 06/23/2020   PSA 4.20 (H) 08/01/2018   INR 1.2 01/07/2020   HGBA1C 5.8 (H) 05/04/2019    ECHOCARDIOGRAM COMPLETE  Result Date: 05/11/2021    ECHOCARDIOGRAM REPORT   Patient Name:   [name] [name] Date of Exam: 05/08/2021 Medical Rec #:  [id number]     Height:       71.0 in Accession #:    [id number]    Weight:       180.2 lb Date of Birth:  [birth year]     BSA:          2.017 m Patient Age:    89 years      BP:           134/69 mmHg Patient Gender: M             HR:           60 bpm. Exam Location:  Outpatient Procedure: 2D Echo Indications:    congestive heart failure  History:        Patient has prior history of Echocardiogram examinations, most  recent 05/07/2020. CHF, CAD,  Arrythmias:Atrial Fibrillation; Risk                 Factors:Hypertension and Dyslipidemia.                 Aortic Valve: 26 mm Edwards Sapien prosthetic, stented (TAVR)                 valve is present in the aortic position. Procedure Date:                 05/08/2019.  Sonographer:    Johny Chess RDCS Referring Phys: [place] Comments: Suboptimal subcostal window and Technically difficult study due to poor echo windows. Image acquisition challenging due to respiratory motion. IMPRESSIONS  1. Left ventricular ejection fraction, by estimation, is 70 to 75%. The left ventricle has hyperdynamic function. The left ventricle has no regional wall motion abnormalities. Left ventricular diastolic parameters are consistent with Grade II diastolic dysfunction (pseudonormalization).  2. Right ventricular systolic function is normal. The right ventricular size is normal. Tricuspid regurgitation signal is inadequate for assessing PA pressure.  3. Left atrial size was mildly dilated.  4. The mitral valve is degenerative. No evidence of mitral valve regurgitation. No evidence of mitral stenosis.  5. The aortic valve has been repaired/replaced. Aortic valve regurgitation is not visualized. There is a 26 mm Edwards Sapien prosthetic (TAVR) valve present in the aortic position. Procedure Date: 05/08/2019. Echo findings are consistent with normal structure and function of the aortic valve prosthesis. Aortic valve mean gradient measures 12.0 mmHg. Aortic valve Vmax measures 2.35 m/s. Aortic valve acceleration time measures 81 msec.  6. The inferior vena cava is normal in size with greater than 50% respiratory variability, suggesting right atrial pressure of 3 mmHg. FINDINGS  Left Ventricle: Left ventricular ejection fraction, by estimation, is 70 to 75%. The left ventricle has hyperdynamic function. The left ventricle has no regional wall motion abnormalities. The left ventricular internal cavity size  was normal in size. There is no left ventricular hypertrophy. Left ventricular diastolic parameters are consistent with Grade II diastolic dysfunction (pseudonormalization). Right Ventricle: The right ventricular size is normal. No increase in right ventricular wall thickness. Right ventricular systolic function is normal. Tricuspid regurgitation signal is inadequate for assessing PA pressure. Left Atrium: Left atrial size was mildly dilated. Right Atrium: Right atrial size was normal in size. Pericardium: There is no evidence of pericardial effusion. Mitral Valve: The mitral valve is degenerative in appearance. Mild to moderate mitral annular calcification. No evidence of mitral valve regurgitation. No evidence of mitral valve stenosis. Tricuspid Valve: The tricuspid valve is normal in structure. Tricuspid valve regurgitation is not demonstrated. Aortic Valve: The aortic valve has been repaired/replaced. Aortic valve regurgitation is not visualized. Aortic valve mean gradient measures 12.0 mmHg. Aortic valve peak gradient measures 22.0 mmHg. Aortic valve area, by VTI measures 1.94 cm. There is a  26 mm Edwards Sapien prosthetic, stented (TAVR) valve present in the aortic position. Procedure Date: 05/08/2019. Echo findings are consistent with normal structure and function of the aortic valve prosthesis. Pulmonic Valve: The pulmonic valve was not well visualized. Pulmonic valve regurgitation is not visualized. Aorta: The aortic root is normal in size and structure. Venous: The inferior vena cava is normal in size with greater than 50% respiratory variability, suggesting right atrial pressure of 3 mmHg. IAS/Shunts: No atrial level shunt detected by color flow Doppler.  LEFT VENTRICLE PLAX 2D LVIDd:  3.40 cm  Diastology LVIDs:         2.10 cm  LV e' medial:    7.07 cm/s LV PW:         1.00 cm  LV E/e' medial:  11.0 LV IVS:        0.90 cm  LV e' lateral:   8.05 cm/s LVOT diam:     2.00 cm  LV E/e' lateral: 9.6  LV SV:         90 LV SV Index:   44 LVOT Area:     3.14 cm  RIGHT VENTRICLE RV S prime:     14.50 cm/s TAPSE (M-mode): 2.3 cm LEFT ATRIUM             Index       RIGHT ATRIUM           Index LA diam:        3.90 cm 1.93 cm/m  RA Area:     18.90 cm LA Vol (A2C):   59.2 ml 29.35 ml/m RA Volume:   51.20 ml  25.38 ml/m LA Vol (A4C):   61.1 ml 30.29 ml/m LA Biplane Vol: 60.3 ml 29.89 ml/m  AORTIC VALVE AV Area (Vmax):    1.73 cm AV Area (Vmean):   1.77 cm AV Area (VTI):     1.94 cm AV Vmax:           234.50 cm/s AV Vmean:          158.500 cm/s AV VTI:            0.462 m AV Peak Grad:      22.0 mmHg AV Mean Grad:      12.0 mmHg LVOT Vmax:         129.00 cm/s LVOT Vmean:        89.100 cm/s LVOT VTI:          0.285 m LVOT/AV VTI ratio: 0.62  AORTA Ao Root diam: 3.50 cm MITRAL VALVE MV Area (PHT): 1.89 cm    SHUNTS MV Decel Time: 401 msec    Systemic VTI:  0.29 m MV E velocity: 77.60 cm/s  Systemic Diam: 2.00 cm MV A velocity: 77.00 cm/s MV E/A ratio:  1.01 Mihai Croitoru MD Electronically signed by Sanda Klein MD Signature Date/Time: 05/11/2021/3:16:23 PM    Final     Assessment & Plan:   Problem List Items Addressed This Visit     Bone metastases ([place])    MM relapsed F/u w/Dr Irene Limbo Oxycodone prn for chronic pain  Potential benefits of a long term opioids use as well as potential risks (i.e. addiction risk, apnea etc) and complications (i.e. Somnolence, constipation and others) were explained to the patient and were aknowledged.      Gait disorder    Cont to walk      HTN (hypertension)    Cont on Toprol, spironolactone, potassium, amlodipine      Multiple myeloma not having achieved remission ([facility])   Relevant Medications   oxyCODONE (OXY IR/ROXICODONE) 5 MG immediate release tablet   Peripheral neuropathy    Chronic pain      Rhinitis     Start Atrovent         Meds ordered this encounter  Medications   oxyCODONE (OXY IR/ROXICODONE) 5 MG immediate release tablet    Sig:  Take 1-2 tablets (5-10 mg total) by mouth 2 (two) times daily as needed for severe pain.    Dispense:  60  tablet    Refill:  0   ipratropium (ATROVENT) 0.06 % nasal spray    Sig: Place 2 sprays into the nose 3 (three) times daily.    Dispense:  15 mL    Refill:  2      Follow-up: Return in about 4 months (around 02/09/2022) for a follow-up visit.  [name], MD

## 2021-10-12 NOTE — Assessment & Plan Note (Addendum)
°  Start Atrovent

## 2021-10-12 NOTE — Assessment & Plan Note (Signed)
Chronic pain 

## 2021-10-12 NOTE — Addendum Note (Signed)
Addended by: Cassandria Anger on: 10/12/2021 10:53 AM   Modules accepted: Orders

## 2021-10-13 ENCOUNTER — Encounter: Payer: Self-pay | Admitting: Hematology

## 2021-10-13 NOTE — Progress Notes (Addendum)
HEMATOLOGY/ONCOLOGY CLINIC NOTE  Date of Service: .10/07/2021   Patient Care Team: Cassandria Anger, MD as PCP - General Larey Dresser, MD as PCP - Advanced Heart Failure (Cardiology) Larey Dresser, MD as PCP - Cardiology (Cardiology) Erline Levine, MD as Attending Physician (Neurosurgery) Jarome Matin, MD as Consulting Physician (Dermatology) Rigoberto Noel, MD as Consulting Physician (Pulmonary Disease) Alda Berthold, DO as Consulting Physician (Neurology)   CHIEF COMPLAINTS/PURPOSE OF CONSULTATION:  Follow-up for continued evaluation and management of multiple myeloma  HISTORY OF PRESENTING ILLNESS:  Please see previous notes for details on initial presentation  INTERVAL HISTORY:  [name] [name] is a wonderful 81 y.o. male who is here for continued evaluation and management of his multiple myeloma.  He is accompanied by his wife for this clinic visit. 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 Symptoms are reasonably controlled and he continues to use oxycodone as needed and gabapentin 600 mg p.o. at bedtime. We recommended he continue to use his PEA supplement as well for neuropathy. No other acute new focal bone pains. No obvious infection issues.  His labs from 09/30/2021 show normal CBC with differential, stable CMP however his myeloma panel shows recurrence of monoclonal IgG kappa paraprotein spike to 0.3 g/dL.  Kappa lambda free light chains show slight increase in kappa free light chains to 22.7 with a kappa lambda ratio of 3.11.  We discussed results in details recommended additional work-up with PET CT scan and bone marrow biopsy to evaluate disease status prior to consideration of second line treatments.  MEDICAL HISTORY:  Past Medical History:  Diagnosis Date   Ascending aortic aneurysm    Bicuspid aortic valve    Cancer (HCC)    CHF NYHA class I (no  symptoms from ordinary activities), acute, diastolic ([place])    Dysrhythmia 2009   A fib   Fatty liver    mild   Fracture    left proximal humerus   GERD (gastroesophageal reflux disease)    GI bleeding 07/21/2018   post polypectomy   Hemorrhoids    HTN (hypertension)    Hypercholesteremia    Hypokalemia    Internal hemorrhoids    LBP (low back pain)    Moderate aortic stenosis    Osteoarthritis    Paroxysmal atrial fibrillation ([place])    a. new onset Afib in 07/2008. He underwent ibutilide cardioversion successfully. b. Recurrence 01/2013 s/p TEE/DCCV - was on Xarelto but he stopped it as he was convinced it was causing joint pn. c. Recurrence 01/2016 - spont conv to NSR. Pt took Eliquis x1 mo then declined further anticoag. d. Recurrence 07/2016.   Pneumonia    Tubular adenoma of colon     SURGICAL HISTORY: Past Surgical History:  Procedure Laterality Date   BACK SURGERY  x12 years ago   [place] VALVE REPLACEMENT  2020   CARDIOVERSION N/A 01/26/2013   Procedure: CARDIOVERSION;  Surgeon: Larey Dresser, MD;  Location: [place];  Service: Cardiovascular;  Laterality: N/A;   CARDIOVERSION N/A 10/28/2017   Procedure: CARDIOVERSION;  Surgeon: Larey Dresser, MD;  Location: [facility] ENDOSCOPY;  Service: Cardiovascular;  Laterality: N/A;   CARDIOVERSION N/A 03/03/2018   Procedure: CARDIOVERSION;  Surgeon: Lelon Perla, MD;  Location: [facility] ENDOSCOPY;  Service: Cardiovascular;  Laterality: N/A;   CARDIOVERSION N/A 09/19/2019   Procedure: CARDIOVERSION;  Surgeon: Larey Dresser, MD;  Location: [place] ENDOSCOPY;  Service: Cardiovascular;  Laterality: N/A;   COLONOSCOPY     COLONOSCOPY  07/17/2018   at [place] Left 03/18/2020   Procedure: OPEN LEFT INGUINAL HERNIA REPAIR;  Surgeon: Alphonsa Overall, MD;  Location: [place];  Service: General;  Laterality: Left;   LUMBAR LAMINECTOMY     ORIF HUMERUS FRACTURE Left 01/15/2020    Procedure: OPEN REDUCTION INTERNAL FIXATION (ORIF) PROXIMAL HUMERUS FRACTURE;  Surgeon: Nicholes Stairs, MD;  Location: [place];  Service: Orthopedics;  Laterality: Left;   POLYPECTOMY     RIGHT HEART CATH N/A 08/20/2019   Procedure: RIGHT HEART CATH;  Surgeon: Larey Dresser, MD;  Location: [place] CV LAB;  Service: Cardiovascular;  Laterality: N/A;   RIGHT/LEFT HEART CATH AND CORONARY ANGIOGRAPHY N/A 03/07/2019   Procedure: RIGHT/LEFT HEART CATH AND CORONARY ANGIOGRAPHY;  Surgeon: Burnell Blanks, MD;  Location: [place] CV LAB;  Service: Cardiovascular;  Laterality: N/A;   TAVAR  04/2019   TEE WITHOUT CARDIOVERSION N/A 01/26/2013   Procedure: TRANSESOPHAGEAL ECHOCARDIOGRAM (TEE);  Surgeon: Larey Dresser, MD;  Location: [place];  Service: Cardiovascular;  Laterality: N/A;   TEE WITHOUT CARDIOVERSION N/A 10/28/2017   Procedure: TRANSESOPHAGEAL ECHOCARDIOGRAM (TEE);  Surgeon: Larey Dresser, MD;  Location: [facility] ENDOSCOPY;  Service: Cardiovascular;  Laterality: N/A;   TEE WITHOUT CARDIOVERSION N/A 05/08/2019   Procedure: TRANSESOPHAGEAL ECHOCARDIOGRAM (TEE);  Surgeon: Burnell Blanks, MD;  Location: [place] CV LAB;  Service: Open Heart Surgery;  Laterality: N/A;   TEE WITHOUT CARDIOVERSION N/A 09/19/2019   Procedure: TRANSESOPHAGEAL ECHOCARDIOGRAM (TEE);  Surgeon: Larey Dresser, MD;  Location: [facility] ENDOSCOPY;  Service: Cardiovascular;  Laterality: N/A;   TRANSCATHETER AORTIC VALVE REPLACEMENT, TRANSFEMORAL N/A 05/08/2019   Procedure: TRANSCATHETER AORTIC VALVE REPLACEMENT, TRANSFEMORAL;  Surgeon: Burnell Blanks, MD;  Location: [place] CV LAB;  Service: Open Heart Surgery;  Laterality: N/A;    SOCIAL HISTORY: Social History   Socioeconomic History   Marital status: Married    Spouse name: Not on file   Number of children: 0   Years of education: Not on file   Highest education level: Not on file  Occupational History   Occupation: Retired  Lobbyist: [employer]  Tobacco Use   Smoking status: Never   Smokeless tobacco: Never  Vaping Use   Vaping Use: Never used  Substance and Sexual Activity   Alcohol use: Not Currently   Drug use: No   Sexual activity: Yes  Other Topics Concern   Not on file  Social History Narrative   Patient lives in [place] w/ his wife. He is a native of [place]. 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   Right-handed   Caffeine: 2 cups coffee per day   Two story home   Social Determinants of Health   Financial Resource Strain: Not on file  Food Insecurity: Not on file  Transportation Needs: Not on file  Physical Activity: Not on file  Stress: Not on file  Social Connections: Not on file  Intimate Partner Violence: Not on file    FAMILY HISTORY: Family History  Problem Relation Age of Onset   Colon cancer Mother 71   Hypertension Other    Coronary artery disease Neg Hx    Colon polyps Neg Hx    Esophageal cancer Neg Hx    Rectal cancer Neg  Hx    Stomach cancer Neg Hx     ALLERGIES:  is allergic to xarelto [rivaroxaban], corticosteroids, ramipril, zolpidem, and benazepril.  MEDICATIONS:  Current Outpatient Medications  Medication Sig Dispense Refill   b complex vitamins tablet Take 1 tablet by mouth daily. 100 tablet 3   Calcium-Magnesium 500-250 MG TABS Take 1 tablet by mouth daily.     Carboxymethylcellul-Glycerin (LUBRICATING EYE DROPS OP) Place 1 drop into both eyes daily as needed (dry eyes).     Cholecalciferol (VITAMIN D) 50 MCG (2000 UT) tablet Take 2,000 Units by mouth daily.     diclofenac Sodium (VOLTAREN) 1 % GEL Apply 1 application topically 2 (two) times daily as needed (pain.).     ELIQUIS 5 MG TABS tablet TAKE 1 TABLET BY MOUTH TWICE A DAY 60 tablet 11   gabapentin (NEURONTIN) 300 MG capsule Take 300 mg by mouth at bedtime.     ipratropium (ATROVENT) 0.06 % nasal spray Place 2 sprays into the nose 3  (three) times daily. 15 mL 2   metoprolol succinate (TOPROL-XL) 25 MG 24 hr tablet TAKE 1 TABLET BY MOUTH TWICE A DAY 180 tablet 3   metroNIDAZOLE (METROCREAM) 0.75 % cream Apply 1 application topically 2 (two) times daily.     oxyCODONE (OXY IR/ROXICODONE) 5 MG immediate release tablet Take 1-2 tablets (5-10 mg total) by mouth 2 (two) times daily as needed for severe pain. 60 tablet 0   polyethylene glycol powder (GLYCOLAX/MIRALAX) 17 GM/SCOOP powder Take 17-34 g by mouth 2 (two) times daily as needed for moderate constipation. 500 g 5   senna-docusate (SENOKOT-S) 8.6-50 MG tablet Take 2 tablets by mouth at bedtime.     spironolactone (ALDACTONE) 25 MG tablet TAKE 1 TABLET BY MOUTH EVERY DAY 90 tablet 3   Vitamin D, Ergocalciferol, (DRISDOL) 1.25 MG (50000 UNIT) CAPS capsule TAKE 1 CAPSULE BY MOUTH ONE TIME PER WEEK 12 capsule 3   No current facility-administered medications for this visit.    REVIEW OF SYSTEMS:   .10 Point review of Systems was done is negative except as noted above.   PHYSICAL EXAMINATION: Vitals:   10/07/21 1044  BP: 137/68  Pulse: 62  Resp: 18  Temp: 97.9 F (36.6 C)  SpO2: 100%   Wt Readings from Last 3 Encounters:  10/12/21 185 lb 6.4 oz (84.1 kg)  10/07/21 185 lb (83.9 kg)  07/29/21 182 lb 11.2 oz (82.9 kg)   Body mass index is 25.8 kg/m.    ECOG FS:2 - Symptomatic, <50% confined to bed  GENERAL:alert, in no acute distress and comfortable SKIN: no acute rashes, no significant lesions EYES: conjunctiva are pink and non-injected, sclera anicteric OROPHARYNX: MMM, no exudates, no oropharyngeal erythema or ulceration NECK: supple, no JVD LYMPH:  no palpable lymphadenopathy in the cervical, axillary or inguinal regions LUNGS: clear to auscultation b/l with normal respiratory effort HEART: regular rate & rhythm ABDOMEN:  normoactive bowel sounds , non tender, not distended. Extremity: no pedal edema PSYCH: alert & oriented x 3 with fluent  speech NEURO: no focal motor/sensory deficits  LABORATORY DATA:  I have reviewed the data as listed  . CBC Latest Ref Rng & Units 10/15/2021 09/30/2021 07/29/2021  WBC 4.0 - 10.5 K/uL 6.3 6.2 5.4  Hemoglobin 13.0 - 17.0 g/dL 14.6 14.8 14.1  Hematocrit 39.0 - 52.0 % 45.0 45.4 42.7  Platelets 150 - 400 K/uL 146(L) 157 142(L)    . CMP Latest Ref Rng & Units 10/15/2021 09/30/2021 07/29/2021  Glucose  70 - 99 mg/dL 94 100(H) 99  BUN 8 - 23 mg/dL 24(H) 20 20  Creatinine 0.61 - 1.24 mg/dL 1.30(H) 1.11 1.04  Sodium 135 - 145 mmol/L 141 142 141  Potassium 3.5 - 5.1 mmol/L 4.1 3.9 3.8  Chloride 98 - 111 mmol/L 106 107 107  CO2 22 - 32 mmol/L _0 Calcium 8.9 - 10.3 mg/dL 9.4 9.5 9.0  Total Protein 6.5 - 8.1 g/dL 6.7 6.6 6.3(L)  Total Bilirubin 0.3 - 1.2 mg/dL 0.6 0.5 0.6  Alkaline Phos 38 - 126 U/L 55 53 57  AST 15 - 41 U/L _1 ALT 0 - 44 U/L _2 05/24/2019 Bone Marrow Biopsy    04/16/2019 Surgical Pathology:   RADIOGRAPHIC STUDIES: I have personally reviewed the radiological images as listed and agreed with the findings in the report. NM PET Image Restage (PS) Whole Body  Result Date: 10/17/2021 CLINICAL DATA:  Subsequent treatment strategy for multiple myeloma. EXAM: NUCLEAR MEDICINE PET WHOLE BODY TECHNIQUE: 9.25 mCi F-18 FDG was injected intravenously. Full-ring PET imaging was performed from the head to foot after the radiotracer. CT data was obtained and used for attenuation correction and anatomic localization. Fasting blood glucose: 98 mg/dl COMPARISON:  11/21/2019 FINDINGS: Mediastinal blood pool activity: SUV max 2.89 HEAD/NECK: No hypermetabolic activity in the scalp. No hypermetabolic cervical lymph nodes. Incidental CT findings: none CHEST: No hypermetabolic mediastinal or hilar nodes. No suspicious pulmonary nodules on the CT scan. Incidental CT findings: Aortic atherosclerosis. 4.3 cm ascending thoracic aortic aneurysm is unchanged. Status post TAVR.  Coronary artery calcifications noted. ABDOMEN/PELVIS: No abnormal FDG uptake within the liver, pancreas, spleen, or adrenal glands. No tracer avid abdominal or pelvic lymph nodes. Incidental CT findings: Aortic atherosclerosis identified. Bilateral kidney cysts noted. Small hiatal hernia. Prostate gland appears enlarged. SKELETON: The index large lucent lesion within the superior right acetabulum is again noted. This measures 6.5 cm and has mild increased tracer uptake. This contains diffuse low-level increased radiotracer uptake with a focal area of more intense uptake within SUV max of 3.92, image 191/4. On the previous exam there was relatively homogeneous uptake within this lesion within SUV max of 2.43. No significant tracer uptake within the 3 cm lucent lesion within the anterior left acetabulum, image 191/4 compatible with a treated bone lesion. Treated lesion within the posterior column of the right acetabulum measures 3.8 x 2.3 cm an has an SUV max of 1.51. This is unchanged from the previous exam. Incidental CT findings: none EXTREMITIES: No abnormal hypermetabolic activity in the lower extremities. Incidental CT findings: none IMPRESSION: 1. There is persistent low level radiotracer uptake associated with the large lucent lesion within the superior. Additionally, there is a new focal area of increased radiotracer uptake above the background low level activity within this lesion with SUV max of 3.92. On the previous exam there was relatively homogeneous low level uptake within this lesion within SUV max of 2.43. Imaging findings are concerning for residual metabolically active tumor. 2. No significant tracer uptake identified within the remaining treated bone lesions. No new sites of disease identified. 3. Stable 4.3 cm ascending thoracic aortic aneurysm. Recommend annual imaging followup by CTA or MRA. This recommendation follows 2010 ACCF/AHA/AATS/ACR/ASA/SCA/SCAI/SIR/STS/SVM Guidelines for the Diagnosis  and Management of Patients with Thoracic Aortic Disease. Circulation. 2010; 121: G811-X726. Aortic aneurysm NOS (ICD10-I71.9) 4. Coronary artery calcifications 5.  Aortic Atherosclerosis (ICD10-I70.0). Electronically Signed   By: Lovena Le  Clovis Riley M.D.   On: 10/17/2021 13:43    ASSESSMENT & PLAN:   81 yo   #1 Plasma cell myeloma-currently in remission.  03/28/2019 MRI pelvis w/wo contrast revealed "1. Destructive bone lesions as detailed above. Findings most consistent with metastatic disease. PET-CT may be helpful for further evaluation and to establish a primary tumor. The right pelvic bone lesions should be amenable to image guided biopsy but a PET scan may demonstrate easier/safer biopsy sites. 2. No intrapelvic mass or adenopathy. 3. Benign intraosseous lipoma involving the left anterior superior acetabulum."  04/06/2019 PET whole body revealed "1. Diffuse osseous metastatic disease as detailed above without findings for a primary neoplasm in the chest, abdomen or pelvis. The large destructive lesion involving the right ischium should be amenable to image guided biopsy. 2. Two small retroperitoneal lymph nodes and 1 small right obturator node showing hypermetabolism."   04/16/2019 Posterior right pelvis bone biopsy revealed PLASMA CELL NEOPLASM  05/24/2019 Bone Marrow Biopsy revealed "BONE MARROW: - CELLULAR MARROW WITH INVOLVEMENT BY PLASMA CELL NEOPLASM (20%) PERIPHERAL BLOOD: - MORPHOLOGICALLY UNREMARKABLE"  05/24/2019 FISH Panel revealed "ABNORMAL result with 11q+, 14q+ and +17"  #2 Severe aortic stenosis with bicuspid aortic valve -10/26/2018 ECHO revealed AVA at 0.8 cm2 and LV EF of 60-65% -05/08/2019 pt had a Transfemoral Transcatheter Aortic Valve Replacement   PLAN: His labs from 09/30/2021 show normal CBC with differential, stable CMP however his myeloma panel shows recurrence of monoclonal IgG kappa paraprotein spike to 0.3 g/dL.  Kappa lambda free light chains show slight  increase in kappa free light chains to 22.7 with a kappa lambda ratio of 3.11.  We discussed results in details recommended additional work-up with PET CT scan and bone marrow biopsy to evaluate disease status prior to consideration of second line treatments.  -Continue Gabapentin 600 mg p.o. at bedtime.  -Continue Oxycodone prn for neuropathy pain -Continue Zometa q50month.  FOLLOW UP: Pet/ct in 1 week CT bone marrow aspiration and biopsy in 1 week RTC with Dr KIrene Limboin 3 weeks  All of the patient's questions were answered with apparent satisfaction. The patient knows to call the clinic with any problems, questions or concerns.   GSullivan LoneMD MS AAHIVMS SCopper Ridge Surgery CenterCShoreline Asc IncHematology/Oncology Physician CCentrast[facility] Center               ..[name]

## 2021-10-15 ENCOUNTER — Encounter (HOSPITAL_COMMUNITY)
Admission: RE | Admit: 2021-10-15 | Discharge: 2021-10-15 | Disposition: A | Discharge status: Home / Self Care | Payer: Medicare Other | Source: Ambulatory Visit | Attending: Hematology | Admitting: Hematology

## 2021-10-15 ENCOUNTER — Telehealth: Payer: Self-pay | Admitting: Hematology

## 2021-10-15 ENCOUNTER — Inpatient Hospital Stay: Payer: Medicare Other | Attending: Hematology

## 2021-10-15 ENCOUNTER — Other Ambulatory Visit: Payer: Self-pay

## 2021-10-15 DIAGNOSIS — C9000 Multiple myeloma not having achieved remission: Secondary | ICD-10-CM

## 2021-10-15 DIAGNOSIS — I10 Essential (primary) hypertension: Secondary | ICD-10-CM | POA: Insufficient documentation

## 2021-10-15 DIAGNOSIS — Z800 Family history of malignant neoplasm of digestive organs: Secondary | ICD-10-CM | POA: Insufficient documentation

## 2021-10-15 DIAGNOSIS — C9001 Multiple myeloma in remission: Secondary | ICD-10-CM | POA: Insufficient documentation

## 2021-10-15 LAB — CMP (CANCER CENTER ONLY)
ALT: 20 U/L (ref 0–44)
AST: 22 U/L (ref 15–41)
Albumin: 4.6 g/dL (ref 3.5–5.0)
Alkaline Phosphatase: 55 U/L (ref 38–126)
Anion gap: 5 (ref 5–15)
BUN: 24 mg/dL — ABNORMAL HIGH (ref 8–23)
CO2: 30 mmol/L (ref 22–32)
Calcium: 9.4 mg/dL (ref 8.9–10.3)
Chloride: 106 mmol/L (ref 98–111)
Creatinine: 1.3 mg/dL — ABNORMAL HIGH (ref 0.61–1.24)
GFR, Estimated: 55 mL/min — ABNORMAL LOW (ref >60)
Glucose, Bld: 94 mg/dL (ref 70–99)
Potassium: 4.1 mmol/L (ref 3.5–5.1)
Sodium: 141 mmol/L (ref 135–145)
Total Bilirubin: 0.6 mg/dL (ref 0.3–1.2)
Total Protein: 6.7 g/dL (ref 6.5–8.1)

## 2021-10-15 LAB — CBC WITH DIFFERENTIAL (CANCER CENTER ONLY)
Abs Immature Granulocytes: 0.03 10*3/uL (ref 0.00–0.07)
Basophils Absolute: 0.1 10*3/uL (ref 0.0–0.1)
Basophils Relative: 1 %
Eosinophils Absolute: 0.2 10*3/uL (ref 0.0–0.5)
Eosinophils Relative: 3 %
HCT: 45 % (ref 39.0–52.0)
Hemoglobin: 14.6 g/dL (ref 13.0–17.0)
Immature Granulocytes: 1 %
Lymphocytes Relative: 22 %
Lymphs Abs: 1.4 10*3/uL (ref 0.7–4.0)
MCH: 29.5 pg (ref 26.0–34.0)
MCHC: 32.4 g/dL (ref 30.0–36.0)
MCV: 90.9 fL (ref 80.0–100.0)
Monocytes Absolute: 0.6 10*3/uL (ref 0.1–1.0)
Monocytes Relative: 9 %
Neutro Abs: 4 10*3/uL (ref 1.7–7.7)
Neutrophils Relative %: 64 %
Platelet Count: 146 10*3/uL — ABNORMAL LOW (ref 150–400)
RBC: 4.95 MIL/uL (ref 4.22–5.81)
RDW: 13 % (ref 11.5–15.5)
WBC Count: 6.3 10*3/uL (ref 4.0–10.5)
nRBC: 0 % (ref 0.0–0.2)

## 2021-10-15 LAB — GLUCOSE, CAPILLARY: Glucose-Capillary: 98 mg/dL (ref 70–99)

## 2021-10-15 MED ORDER — FLUDEOXYGLUCOSE F - 18 (FDG) INJECTION
9.2500 | Freq: Once | INTRAVENOUS | Status: AC | PRN
  Administered 2021-10-15: 9.25 via INTRAVENOUS

## 2021-10-15 NOTE — Telephone Encounter (Signed)
Scheduled per 01/25 los, patient has been called and voicemail was left. °

## 2021-10-16 LAB — KAPPA/LAMBDA LIGHT CHAINS
Kappa free light chain: 24.3 mg/L — ABNORMAL HIGH (ref 3.3–19.4)
Kappa, lambda light chain ratio: 3.08 — ABNORMAL HIGH (ref 0.26–1.65)
Lambda free light chains: 7.9 mg/L (ref 5.7–26.3)

## 2021-10-20 LAB — MULTIPLE MYELOMA PANEL, SERUM
Albumin SerPl Elph-Mcnc: 4.1 g/dL (ref 2.9–4.4)
Albumin/Glob SerPl: 1.8 — ABNORMAL HIGH (ref 0.7–1.7)
Alpha 1: 0.2 g/dL (ref 0.0–0.4)
Alpha2 Glob SerPl Elph-Mcnc: 0.6 g/dL (ref 0.4–1.0)
B-Globulin SerPl Elph-Mcnc: 0.9 g/dL (ref 0.7–1.3)
Gamma Glob SerPl Elph-Mcnc: 0.6 g/dL (ref 0.4–1.8)
Globulin, Total: 2.3 g/dL (ref 2.2–3.9)
IgA: 72 mg/dL (ref 61–437)
IgG (Immunoglobin G), Serum: 511 mg/dL — ABNORMAL LOW (ref 603–1613)
IgM (Immunoglobulin M), Srm: 60 mg/dL (ref 15–143)
M Protein SerPl Elph-Mcnc: 0.3 g/dL — ABNORMAL HIGH
Total Protein ELP: 6.4 g/dL (ref 6.0–8.5)

## 2021-10-25 NOTE — Addendum Note (Signed)
Addended by: Sullivan Lone on: 10/25/2021 01:32 PM   Modules accepted: Orders

## 2021-10-29 ENCOUNTER — Inpatient Hospital Stay (HOSPITAL_BASED_OUTPATIENT_CLINIC_OR_DEPARTMENT_OTHER): Payer: Medicare Other | Admitting: Hematology

## 2021-10-29 ENCOUNTER — Other Ambulatory Visit: Payer: Self-pay

## 2021-10-29 VITALS — BP 131/67 | HR 66 | Temp 98.2°F | Resp 17 | Ht 71.0 in | Wt 183.2 lb

## 2021-10-29 DIAGNOSIS — Z800 Family history of malignant neoplasm of digestive organs: Secondary | ICD-10-CM | POA: Insufficient documentation

## 2021-10-29 DIAGNOSIS — I10 Essential (primary) hypertension: Secondary | ICD-10-CM | POA: Insufficient documentation

## 2021-10-29 DIAGNOSIS — C9001 Multiple myeloma in remission: Secondary | ICD-10-CM | POA: Diagnosis not present

## 2021-10-29 DIAGNOSIS — C9000 Multiple myeloma not having achieved remission: Secondary | ICD-10-CM

## 2021-11-02 ENCOUNTER — Telehealth: Payer: Self-pay | Admitting: Hematology

## 2021-11-02 ENCOUNTER — Other Ambulatory Visit: Payer: Self-pay | Admitting: Radiology

## 2021-11-02 NOTE — Telephone Encounter (Signed)
Scheduled follow-up appointment per 2/16 los. Patient's wife is aware.

## 2021-11-03 ENCOUNTER — Ambulatory Visit (HOSPITAL_COMMUNITY)
Admission: RE | Admit: 2021-11-03 | Discharge: 2021-11-03 | Disposition: A | Discharge status: Home / Self Care | Payer: Medicare Other | Source: Ambulatory Visit | Attending: Hematology | Admitting: Hematology

## 2021-11-03 ENCOUNTER — Encounter (HOSPITAL_COMMUNITY): Payer: Self-pay

## 2021-11-03 ENCOUNTER — Other Ambulatory Visit: Payer: Self-pay

## 2021-11-03 DIAGNOSIS — D472 Monoclonal gammopathy: Secondary | ICD-10-CM | POA: Diagnosis not present

## 2021-11-03 DIAGNOSIS — E8581 Light chain (AL) amyloidosis: Secondary | ICD-10-CM | POA: Diagnosis not present

## 2021-11-03 DIAGNOSIS — C9000 Multiple myeloma not having achieved remission: Secondary | ICD-10-CM | POA: Insufficient documentation

## 2021-11-03 DIAGNOSIS — M79672 Pain in left foot: Secondary | ICD-10-CM | POA: Diagnosis not present

## 2021-11-03 DIAGNOSIS — G629 Polyneuropathy, unspecified: Secondary | ICD-10-CM | POA: Insufficient documentation

## 2021-11-03 DIAGNOSIS — D696 Thrombocytopenia, unspecified: Secondary | ICD-10-CM | POA: Diagnosis not present

## 2021-11-03 DIAGNOSIS — M79671 Pain in right foot: Secondary | ICD-10-CM | POA: Diagnosis not present

## 2021-11-03 LAB — CBC WITH DIFFERENTIAL/PLATELET
Abs Immature Granulocytes: 0.03 10*3/uL (ref 0.00–0.07)
Basophils Absolute: 0 10*3/uL (ref 0.0–0.1)
Basophils Relative: 1 %
Eosinophils Absolute: 0.3 10*3/uL (ref 0.0–0.5)
Eosinophils Relative: 6 %
HCT: 47.6 % (ref 39.0–52.0)
Hemoglobin: 15.2 g/dL (ref 13.0–17.0)
Immature Granulocytes: 1 %
Lymphocytes Relative: 24 %
Lymphs Abs: 1.4 10*3/uL (ref 0.7–4.0)
MCH: 29.7 pg (ref 26.0–34.0)
MCHC: 31.9 g/dL (ref 30.0–36.0)
MCV: 93 fL (ref 80.0–100.0)
Monocytes Absolute: 0.6 10*3/uL (ref 0.1–1.0)
Monocytes Relative: 9 %
Neutro Abs: 3.6 10*3/uL (ref 1.7–7.7)
Neutrophils Relative %: 59 %
Platelets: 144 10*3/uL — ABNORMAL LOW (ref 150–400)
RBC: 5.12 MIL/uL (ref 4.22–5.81)
RDW: 12.7 % (ref 11.5–15.5)
WBC: 6 10*3/uL (ref 4.0–10.5)
nRBC: 0 % (ref 0.0–0.2)

## 2021-11-03 MED ORDER — FENTANYL CITRATE (PF) 100 MCG/2ML IJ SOLN
INTRAMUSCULAR | Status: AC | PRN
  Administered 2021-11-03: 50 ug via INTRAVENOUS

## 2021-11-03 MED ORDER — MIDAZOLAM HCL 2 MG/2ML IJ SOLN
INTRAMUSCULAR | Status: AC | PRN
  Administered 2021-11-03: 1 mg via INTRAVENOUS

## 2021-11-03 MED ORDER — FLUMAZENIL 0.5 MG/5ML IV SOLN
INTRAVENOUS | Status: AC
  Filled 2021-11-03: qty 5

## 2021-11-03 MED ORDER — FENTANYL CITRATE (PF) 100 MCG/2ML IJ SOLN
INTRAMUSCULAR | Status: AC
  Filled 2021-11-03: qty 2

## 2021-11-03 MED ORDER — LIDOCAINE HCL (PF) 1 % IJ SOLN
INTRAMUSCULAR | Status: AC | PRN
  Administered 2021-11-03: 10 mL via INTRADERMAL

## 2021-11-03 MED ORDER — MIDAZOLAM HCL 2 MG/2ML IJ SOLN
INTRAMUSCULAR | Status: AC
  Filled 2021-11-03: qty 2

## 2021-11-03 MED ORDER — NALOXONE HCL 0.4 MG/ML IJ SOLN
INTRAMUSCULAR | Status: AC
  Filled 2021-11-03: qty 1

## 2021-11-03 MED ORDER — SODIUM CHLORIDE 0.9 % IV SOLN: INTRAVENOUS | Status: DC

## 2021-11-03 NOTE — Procedures (Signed)
Vascular and Interventional Radiology Procedure Note  Patient: [name] [name] DOB: [birth year] Medical Record Number: [id number] Note Date/Time: 11/03/21 10:04 AM   Performing Physician: Michaelle Birks, MD Assistant(s): None  Diagnosis: MM  Procedure: BONE MARROW ASPIRATION AND BIOPSY  Anesthesia: Conscious Sedation Complications: None Estimated Blood Loss: Minimal Specimens: Sent for Pathology  Findings:  Successful CT-guided bone marrow biopsy A total of 1 cores were obtained. Hemostasis of the tract was achieved using Manual Pressure.  Plan: Bed rest for 1 hours.  See detailed procedure note with images in PACS. The patient tolerated the procedure well without incident or complication and was returned to Recovery in stable condition.    Michaelle Birks, MD [facility] Inter[facility] Radiology [facility] Radiology   Pager. [place]

## 2021-11-03 NOTE — H&P (Signed)
Referring Physician(s): Johney Maine  Supervising Physician: Roanna Banning  Patient Status:  [facility]  Chief Complaint:  "I'm here for a bone marrow biopsy"  Subjective: Pt known to IR service from posterior acetabulum lesion biopsy in 2020, and bone marrow biopsies in 2020 and 2021.  He has a history of multiple myeloma diagnosed in 2020.  His recent myeloma panel shows recurrence of monoclonal IgG kappa paraprotein spike as well as slight increase in kappa free light chains.  He presents today for CT-guided bone marrow biopsy to evaluate disease status prior to consideration of second line treatments.  Currently denies fever, headache, chest pain, dyspnea, cough, abdominal/back pain, nausea, vomiting or bleeding.  He does have some bilateral foot pain/neuropathy.  Additional medical history as below.  Past Medical History:  Diagnosis Date   Ascending aortic aneurysm    Bicuspid aortic valve    Cancer (HCC)    CHF NYHA class I (no symptoms from ordinary activities), acute, diastolic (HCC)    Dysrhythmia 2122   A fib   Fatty liver    mild   Fracture    left proximal humerus   GERD (gastroesophageal reflux disease)    GI bleeding 07/21/2018   post polypectomy   Hemorrhoids    HTN (hypertension)    Hypercholesteremia    Hypokalemia    Internal hemorrhoids    LBP (low back pain)    Moderate aortic stenosis    Osteoarthritis    Paroxysmal atrial fibrillation (HCC)    a. new onset Afib in 07/2008. He underwent ibutilide cardioversion successfully. b. Recurrence 01/2013 s/p TEE/DCCV - was on Xarelto but he stopped it as he was convinced it was causing joint pn. c. Recurrence 01/2016 - spont conv to NSR. Pt took Eliquis x1 mo then declined further anticoag. d. Recurrence 07/2016.   Pneumonia    Tubular adenoma of colon    Past Surgical History:  Procedure Laterality Date   BACK SURGERY  x12 years ago   CARDIAC CATHETERIZATION  2020   CARDIAC VALVE REPLACEMENT  2020    CARDIOVERSION N/A 01/26/2013   Procedure: CARDIOVERSION;  Surgeon: Laurey Morale, MD;  Location: [facility] ENDOSCOPY;  Service: Cardiovascular;  Laterality: N/A;   CARDIOVERSION N/A 10/28/2017   Procedure: CARDIOVERSION;  Surgeon: Laurey Morale, MD;  Location: [facility] ENDOSCOPY;  Service: Cardiovascular;  Laterality: N/A;   CARDIOVERSION N/A 03/03/2018   Procedure: CARDIOVERSION;  Surgeon: Lewayne Bunting, MD;  Location: [facility] ENDOSCOPY;  Service: Cardiovascular;  Laterality: N/A;   CARDIOVERSION N/A 09/19/2019   Procedure: CARDIOVERSION;  Surgeon: Laurey Morale, MD;  Location: [facility] ENDOSCOPY;  Service: Cardiovascular;  Laterality: N/A;   COLONOSCOPY     COLONOSCOPY  07/17/2018   at [facility]   HEMORRHOID SURGERY     INGUINAL HERNIA REPAIR Left 03/18/2020   Procedure: OPEN LEFT INGUINAL HERNIA REPAIR;  Surgeon: Ovidio Kin, MD;  Location: [facility] OR;  Service: General;  Laterality: Left;   LUMBAR LAMINECTOMY     ORIF HUMERUS FRACTURE Left 01/15/2020   Procedure: OPEN REDUCTION INTERNAL FIXATION (ORIF) PROXIMAL HUMERUS FRACTURE;  Surgeon: Yolonda Kida, MD;  Location: [facility] OR;  Service: Orthopedics;  Laterality: Left;   POLYPECTOMY     RIGHT HEART CATH N/A 08/20/2019   Procedure: RIGHT HEART CATH;  Surgeon: Laurey Morale, MD;  Location: [facility] INVASIVE CV LAB;  Service: Cardiovascular;  Laterality: N/A;   RIGHT/LEFT HEART CATH AND CORONARY ANGIOGRAPHY N/A 03/07/2019   Procedure: RIGHT/LEFT HEART CATH AND CORONARY  ANGIOGRAPHY;  Surgeon: Burnell Blanks, MD;  Location: [place] CV LAB;  Service: Cardiovascular;  Laterality: N/A;   TAVAR  04/2019   TEE WITHOUT CARDIOVERSION N/A 01/26/2013   Procedure: TRANSESOPHAGEAL ECHOCARDIOGRAM (TEE);  Surgeon: Larey Dresser, MD;  Location: [place];  Service: Cardiovascular;  Laterality: N/A;   TEE WITHOUT CARDIOVERSION N/A 10/28/2017   Procedure: TRANSESOPHAGEAL ECHOCARDIOGRAM (TEE);  Surgeon: Larey Dresser, MD;  Location: [facility] ENDOSCOPY;  Service:  Cardiovascular;  Laterality: N/A;   TEE WITHOUT CARDIOVERSION N/A 05/08/2019   Procedure: TRANSESOPHAGEAL ECHOCARDIOGRAM (TEE);  Surgeon: Burnell Blanks, MD;  Location: [place] CV LAB;  Service: Open Heart Surgery;  Laterality: N/A;   TEE WITHOUT CARDIOVERSION N/A 09/19/2019   Procedure: TRANSESOPHAGEAL ECHOCARDIOGRAM (TEE);  Surgeon: Larey Dresser, MD;  Location: [facility] ENDOSCOPY;  Service: Cardiovascular;  Laterality: N/A;   TRANSCATHETER AORTIC VALVE REPLACEMENT, TRANSFEMORAL N/A 05/08/2019   Procedure: TRANSCATHETER AORTIC VALVE REPLACEMENT, TRANSFEMORAL;  Surgeon: Burnell Blanks, MD;  Location: [place] CV LAB;  Service: Open Heart Surgery;  Laterality: N/A;       Allergies: Xarelto [rivaroxaban], Corticosteroids, Ramipril, Zolpidem, and Benazepril  Medications: Prior to Admission medications   Medication Sig Start Date End Date Taking? Authorizing Provider  b complex vitamins tablet Take 1 tablet by mouth daily. 09/25/19  Yes [name], [name], MD  Calcium-Magnesium 500-250 MG TABS Take 1 tablet by mouth daily.   Yes [provider]  Carboxymethylcellul-Glycerin (LUBRICATING EYE DROPS OP) Place 1 drop into both eyes daily as needed (dry eyes).   Yes [provider]  Cholecalciferol (VITAMIN D) 50 MCG (2000 UT) tablet Take 2,000 Units by mouth daily.   Yes [provider]  diclofenac Sodium (VOLTAREN) 1 % GEL Apply 1 application topically 2 (two) times daily as needed (pain.).   Yes [provider]  ELIQUIS 5 MG TABS tablet TAKE 1 TABLET BY MOUTH TWICE A DAY 03/12/21  Yes Larey Dresser, MD  gabapentin (NEURONTIN) 300 MG capsule Take 300 mg by mouth at bedtime.   Yes [provider]  ipratropium (ATROVENT) 0.06 % nasal spray Place 2 sprays into the nose 3 (three) times daily. 10/12/21 10/12/22 Yes [name], [name], MD  metoprolol succinate (TOPROL-XL) 25 MG 24 hr tablet TAKE 1 TABLET BY MOUTH TWICE A DAY 12/29/20  Yes  Larey Dresser, MD  oxyCODONE (OXY IR/ROXICODONE) 5 MG immediate release tablet Take 1-2 tablets (5-10 mg total) by mouth 2 (two) times daily as needed for severe pain. 10/12/21  Yes [name], [name], MD  polyethylene glycol powder (GLYCOLAX/MIRALAX) 17 GM/SCOOP powder Take 17-34 g by mouth 2 (two) times daily as needed for moderate constipation. 09/25/19  Yes [name], [name], MD  Vitamin D, Ergocalciferol, (DRISDOL) 1.25 MG (50000 UNIT) CAPS capsule TAKE 1 CAPSULE BY MOUTH ONE TIME PER WEEK 05/04/21  Yes [name], [name], MD  metroNIDAZOLE (METROCREAM) 0.75 % cream Apply 1 application topically 2 (two) times daily.    [provider]  senna-docusate (SENOKOT-S) 8.6-50 MG tablet Take 2 tablets by mouth at bedtime.    [provider]  spironolactone (ALDACTONE) 25 MG tablet TAKE 1 TABLET BY MOUTH EVERY DAY 10/07/21   Larey Dresser, MD     Vital Signs: BP 125/73 (BP Location: Right Arm)    Pulse (!) 56    Temp (!) 97.5 F (36.4 C) (Oral)    Resp 15    Ht $R'5\' 11"'Qn$  (1.803 m)    Wt 183 lb  3.2 oz (83.1 kg)    SpO2 99%    BMI 25.55 kg/m   Physical Exam awake, alert.  Chest clear to auscultation bilaterally.  Heart with slightly bradycardic but regular rhythm.  Abdomen soft, positive bowel sounds, nontender.  No significant lower extremity edema.  Imaging: No results found.  Labs:  CBC: Recent Labs    05/06/21 1446 07/29/21 0835 09/30/21 0855 10/15/21 1118  WBC 7.0 5.4 6.2 6.3  HGB 13.4 14.1 14.8 14.6  HCT 40.8 42.7 45.4 45.0  PLT 149* 142* 157 146*    COAGS: No results for input(s): INR, APTT in the last 8760 hours.  BMP: Recent Labs    05/06/21 1446 07/29/21 0835 09/30/21 0855 10/15/21 1118  NA 143 141 142 141  K 4.2 3.8 3.9 4.1  CL 108 107 107 106  CO2 $Re'26 27 29 30  'WOs$ GLUCOSE 102* 99 100* 94  BUN $Re'17 20 20 'YXJ$ 24*  CALCIUM 9.5 9.0 9.5 9.4  CREATININE 1.14 1.04 1.11 1.30*  GFRNONAA >60 >60 >60 55*    LIVER FUNCTION TESTS: Recent Labs     05/06/21 1446 07/29/21 0835 09/30/21 0855 10/15/21 1118  BILITOT 0.5 0.6 0.5 0.6  AST $Re'23 23 23 22  'QUb$ ALT $R'20 20 20 20  'dY$ ALKPHOS 55 57 53 55  PROT 6.3* 6.3* 6.6 6.7  ALBUMIN 4.3 4.2 4.5 4.6    Assessment and Plan: Pt known to IR service from posterior acetabulum lesion biopsy in 2020, and bone marrow biopsies in 2020 and 2021.  He has a history of multiple myeloma diagnosed in 2020.  His recent myeloma panel shows recurrence of monoclonal IgG kappa paraprotein spike as well as slight increase in kappa free light chains.  He presents today for CT-guided bone marrow biopsy to evaluate disease status prior to consideration of second line treatments.Risks and benefits of procedure was discussed with the patient  including, but not limited to bleeding, infection, damage to adjacent structures or low yield requiring additional tests.  All of the questions were answered and there is agreement to proceed.  Consent signed and in chart.    Electronically Signed: D. Rowe Robert, PA-C 11/03/2021, 8:21 AM   I spent a total of 20 minutes at the the patient's bedside AND on the patient's hospital floor or unit, greater than 50% of which was counseling/coordinating care for CT-guided bone marrow biopsy

## 2021-11-05 ENCOUNTER — Encounter: Payer: Self-pay | Admitting: Hematology

## 2021-11-05 LAB — SURGICAL PATHOLOGY

## 2021-11-05 MED ORDER — LENALIDOMIDE 10 MG PO CAPS: 10.0000 mg | ORAL_CAPSULE | Freq: Every day | ORAL | 1 refills | Status: DC

## 2021-11-05 NOTE — Progress Notes (Signed)
HEMATOLOGY/ONCOLOGY CLINIC NOTE  Date of Service: .10/29/2021   Patient Care Team: Cassandria Anger, MD as PCP - General Larey Dresser, MD as PCP - Advanced Heart Failure (Cardiology) Larey Dresser, MD as PCP - Cardiology (Cardiology) Erline Levine, MD as Attending Physician (Neurosurgery) Jarome Matin, MD as Consulting Physician (Dermatology) Rigoberto Noel, MD as Consulting Physician (Pulmonary Disease) Alda Berthold, DO as Consulting Physician (Neurology)   CHIEF COMPLAINTS/PURPOSE OF CONSULTATION:  Follow-up for continued evaluation and management of multiple myeloma and review of PET CT scan results  HISTORY OF PRESENTING ILLNESS:  Please see previous notes for details on initial presentation  INTERVAL HISTORY:  [name] [name] is a wonderful 81 y.o. male who is here with his wife for continued evaluation and management of multiple myeloma. His bone marrow biopsy is still pending. He had a PET CT scan which shows There is persistent low level radiotracer uptake associated with the large lucent lesion within the superior right acetabulum. Additionally, there is a new focal area of increased radiotracer uptake above the background low level activity within this lesion with SUV max of 3.92. On the previous exam there was relatively homogeneous low level uptake within this lesion within SUV max of 2.43. Imaging findings are concerning for residual metabolically active tumor. He notes no new significant symptoms. Continues to eat well and is trying to ambulate as much as he can.  MEDICAL HISTORY:  Past Medical History:  Diagnosis Date   Ascending aortic aneurysm    Bicuspid aortic valve    Cancer (HCC)    CHF NYHA class I (no symptoms from ordinary activities), acute, diastolic ([place])    Dysrhythmia 2009   A fib   Fatty liver    mild   Fracture    left proximal humerus   GERD (gastroesophageal reflux disease)    GI bleeding 07/21/2018   post polypectomy    Hemorrhoids    HTN (hypertension)    Hypercholesteremia    Hypokalemia    Internal hemorrhoids    LBP (low back pain)    Moderate aortic stenosis    Osteoarthritis    Paroxysmal atrial fibrillation ([place])    a. new onset Afib in 07/2008. He underwent ibutilide cardioversion successfully. b. Recurrence 01/2013 s/p TEE/DCCV - was on Xarelto but he stopped it as he was convinced it was causing joint pn. c. Recurrence 01/2016 - spont conv to NSR. Pt took Eliquis x1 mo then declined further anticoag. d. Recurrence 07/2016.   Pneumonia    Tubular adenoma of colon     SURGICAL HISTORY: Past Surgical History:  Procedure Laterality Date   BACK SURGERY  x12 years ago   [place] VALVE REPLACEMENT  2020   CARDIOVERSION N/A 01/26/2013   Procedure: CARDIOVERSION;  Surgeon: Larey Dresser, MD;  Location: [place];  Service: Cardiovascular;  Laterality: N/A;   CARDIOVERSION N/A 10/28/2017   Procedure: CARDIOVERSION;  Surgeon: Larey Dresser, MD;  Location: [facility] ENDOSCOPY;  Service: Cardiovascular;  Laterality: N/A;   CARDIOVERSION N/A 03/03/2018   Procedure: CARDIOVERSION;  Surgeon: Lelon Perla, MD;  Location: [facility] ENDOSCOPY;  Service: Cardiovascular;  Laterality: N/A;   CARDIOVERSION N/A 09/19/2019   Procedure: CARDIOVERSION;  Surgeon: Larey Dresser, MD;  Location: [place];  Service: Cardiovascular;  Laterality: N/A;   COLONOSCOPY     COLONOSCOPY  07/17/2018   at [place] Left 03/18/2020  Procedure: OPEN LEFT INGUINAL HERNIA REPAIR;  Surgeon: Alphonsa Overall, MD;  Location: [place];  Service: General;  Laterality: Left;   LUMBAR LAMINECTOMY     ORIF HUMERUS FRACTURE Left 01/15/2020   Procedure: OPEN REDUCTION INTERNAL FIXATION (ORIF) PROXIMAL HUMERUS FRACTURE;  Surgeon: Nicholes Stairs, MD;  Location: [place];  Service: Orthopedics;  Laterality: Left;   POLYPECTOMY     RIGHT HEART CATH N/A 08/20/2019   Procedure:  RIGHT HEART CATH;  Surgeon: Larey Dresser, MD;  Location: [place] CV LAB;  Service: Cardiovascular;  Laterality: N/A;   RIGHT/LEFT HEART CATH AND CORONARY ANGIOGRAPHY N/A 03/07/2019   Procedure: RIGHT/LEFT HEART CATH AND CORONARY ANGIOGRAPHY;  Surgeon: Burnell Blanks, MD;  Location: [name] CV LAB;  Service: Cardiovascular;  Laterality: N/A;   TAVAR  04/2019   TEE WITHOUT CARDIOVERSION N/A 01/26/2013   Procedure: TRANSESOPHAGEAL ECHOCARDIOGRAM (TEE);  Surgeon: Larey Dresser, MD;  Location: [place];  Service: Cardiovascular;  Laterality: N/A;   TEE WITHOUT CARDIOVERSION N/A 10/28/2017   Procedure: TRANSESOPHAGEAL ECHOCARDIOGRAM (TEE);  Surgeon: Larey Dresser, MD;  Location: [facility] ENDOSCOPY;  Service: Cardiovascular;  Laterality: N/A;   TEE WITHOUT CARDIOVERSION N/A 05/08/2019   Procedure: TRANSESOPHAGEAL ECHOCARDIOGRAM (TEE);  Surgeon: Burnell Blanks, MD;  Location: [place] CV LAB;  Service: Open Heart Surgery;  Laterality: N/A;   TEE WITHOUT CARDIOVERSION N/A 09/19/2019   Procedure: TRANSESOPHAGEAL ECHOCARDIOGRAM (TEE);  Surgeon: Larey Dresser, MD;  Location: [facility] ENDOSCOPY;  Service: Cardiovascular;  Laterality: N/A;   TRANSCATHETER AORTIC VALVE REPLACEMENT, TRANSFEMORAL N/A 05/08/2019   Procedure: TRANSCATHETER AORTIC VALVE REPLACEMENT, TRANSFEMORAL;  Surgeon: Burnell Blanks, MD;  Location: [place] CV LAB;  Service: Open Heart Surgery;  Laterality: N/A;    SOCIAL HISTORY: Social History   Socioeconomic History   Marital status: Married    Spouse name: Not on file   Number of children: 0   Years of education: Not on file   Highest education level: Not on file  Occupational History   Occupation: Retired Lobbyist: [employer]  Tobacco Use   Smoking status: Never   Smokeless tobacco: Never  Vaping Use   Vaping Use: Never used  Substance and Sexual Activity   Alcohol use: Not Currently   Drug use: No   Sexual  activity: Yes  Other Topics Concern   Not on file  Social History Narrative   Patient lives in [place] w/ his wife. He is a native of [place]. 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   Right-handed   Caffeine: 2 cups coffee per day   Two story home   Social Determinants of Health   Financial Resource Strain: Not on file  Food Insecurity: Not on file  Transportation Needs: Not on file  Physical Activity: Not on file  Stress: Not on file  Social Connections: Not on file  Intimate Partner Violence: Not on file    FAMILY HISTORY: Family History  Problem Relation Age of Onset   Colon cancer Mother 68   Hypertension Other    Coronary artery disease Neg Hx    Colon polyps Neg Hx    Esophageal cancer Neg Hx    Rectal cancer Neg Hx    Stomach cancer Neg Hx     ALLERGIES:  is allergic to xarelto [rivaroxaban], corticosteroids, ramipril, zolpidem, and benazepril.  MEDICATIONS:  Current Outpatient Medications  Medication Sig Dispense Refill   b  complex vitamins tablet Take 1 tablet by mouth daily. 100 tablet 3   Calcium-Magnesium 500-250 MG TABS Take 1 tablet by mouth daily.     Carboxymethylcellul-Glycerin (LUBRICATING EYE DROPS OP) Place 1 drop into both eyes daily as needed (dry eyes).     Cholecalciferol (VITAMIN D) 50 MCG (2000 UT) tablet Take 2,000 Units by mouth daily.     diclofenac Sodium (VOLTAREN) 1 % GEL Apply 1 application topically 2 (two) times daily as needed (pain.).     ELIQUIS 5 MG TABS tablet TAKE 1 TABLET BY MOUTH TWICE A DAY 60 tablet 11   gabapentin (NEURONTIN) 300 MG capsule Take 300 mg by mouth at bedtime.     ipratropium (ATROVENT) 0.06 % nasal spray Place 2 sprays into the nose 3 (three) times daily. 15 mL 2   metoprolol succinate (TOPROL-XL) 25 MG 24 hr tablet TAKE 1 TABLET BY MOUTH TWICE A DAY 180 tablet 3   metroNIDAZOLE (METROCREAM) 0.75 % cream Apply 1 application topically 2 (two) times daily.     oxyCODONE  (OXY IR/ROXICODONE) 5 MG immediate release tablet Take 1-2 tablets (5-10 mg total) by mouth 2 (two) times daily as needed for severe pain. 60 tablet 0   polyethylene glycol powder (GLYCOLAX/MIRALAX) 17 GM/SCOOP powder Take 17-34 g by mouth 2 (two) times daily as needed for moderate constipation. 500 g 5   senna-docusate (SENOKOT-S) 8.6-50 MG tablet Take 2 tablets by mouth at bedtime.     spironolactone (ALDACTONE) 25 MG tablet TAKE 1 TABLET BY MOUTH EVERY DAY 90 tablet 3   Vitamin D, Ergocalciferol, (DRISDOL) 1.25 MG (50000 UNIT) CAPS capsule TAKE 1 CAPSULE BY MOUTH ONE TIME PER WEEK 12 capsule 3   No current facility-administered medications for this visit.    REVIEW OF SYSTEMS:   10 Point review of Systems was done is negative except as noted above.   PHYSICAL EXAMINATION: Vitals:   10/29/21 1413  BP: 131/67  Pulse: 66  Resp: 17  Temp: 98.2 F (36.8 C)  SpO2: 99%   Wt Readings from Last 3 Encounters:  11/03/21 183 lb 3.2 oz (83.1 kg)  10/29/21 183 lb 3.2 oz (83.1 kg)  10/12/21 185 lb 6.4 oz (84.1 kg)   Body mass index is 25.55 kg/m.    ECOG FS:2 - Symptomatic, <50% confined to bed  NAD GENERAL:alert, in no acute distress and comfortable SKIN: no acute rashes, no significant lesions EYES: conjunctiva are pink and non-injected, sclera anicteric OROPHARYNX: MMM, no exudates, no oropharyngeal erythema or ulceration NECK: supple, no JVD LYMPH:  no palpable lymphadenopathy in the cervical, axillary or inguinal regions LUNGS: clear to auscultation b/l with normal respiratory effort HEART: regular rate & rhythm ABDOMEN:  normoactive bowel sounds , non tender, not distended. Extremity: no pedal edema PSYCH: alert & oriented x 3 with fluent speech NEURO: no focal motor/sensory deficits   LABORATORY DATA:  I have reviewed the data as listed  . CBC Latest Ref Rng & Units 11/03/2021 10/15/2021 09/30/2021  WBC 4.0 - 10.5 K/uL 6.0 6.3 6.2  Hemoglobin 13.0 - 17.0 g/dL 15.2 14.6  14.8  Hematocrit 39.0 - 52.0 % 47.6 45.0 45.4  Platelets 150 - 400 K/uL 144(L) 146(L) 157    . CMP Latest Ref Rng & Units 10/15/2021 09/30/2021 07/29/2021  Glucose 70 - 99 mg/dL 94 100(H) 99  BUN 8 - 23 mg/dL 24(H) 20 20  Creatinine 0.61 - 1.24 mg/dL 1.30(H) 1.11 1.04  Sodium 135 - 145 mmol/L 141 142 141  Potassium 3.5 - 5.1 mmol/L 4.1 3.9 3.8  Chloride 98 - 111 mmol/L 106 107 107  CO2 22 - 32 mmol/L $RemoveB'30 29 27  'ncPgFXCx$ Calcium 8.9 - 10.3 mg/dL 9.4 9.5 9.0  Total Protein 6.5 - 8.1 g/dL 6.7 6.6 6.3(L)  Total Bilirubin 0.3 - 1.2 mg/dL 0.6 0.5 0.6  Alkaline Phos 38 - 126 U/L 55 53 57  AST 15 - 41 U/L $Remo'22 23 23  'KwElh$ ALT 0 - 44 U/L $Remo'20 20 20         'NFJwj$ 05/24/2019 Bone Marrow Biopsy    04/16/2019 Surgical Pathology:   RADIOGRAPHIC STUDIES: I have personally reviewed the radiological images as listed and agreed with the findings in the report. NM PET Image Restage (PS) Whole Body  Result Date: 10/17/2021 CLINICAL DATA:  Subsequent treatment strategy for multiple myeloma. EXAM: NUCLEAR MEDICINE PET WHOLE BODY TECHNIQUE: 9.25 mCi F-18 FDG was injected intravenously. Full-ring PET imaging was performed from the head to foot after the radiotracer. CT data was obtained and used for attenuation correction and anatomic localization. Fasting blood glucose: 98 mg/dl COMPARISON:  11/21/2019 FINDINGS: Mediastinal blood pool activity: SUV max 2.89 HEAD/NECK: No hypermetabolic activity in the scalp. No hypermetabolic cervical lymph nodes. Incidental CT findings: none CHEST: No hypermetabolic mediastinal or hilar nodes. No suspicious pulmonary nodules on the CT scan. Incidental CT findings: Aortic atherosclerosis. 4.3 cm ascending thoracic aortic aneurysm is unchanged. Status post TAVR. Coronary artery calcifications noted. ABDOMEN/PELVIS: No abnormal FDG uptake within the liver, pancreas, spleen, or adrenal glands. No tracer avid abdominal or pelvic lymph nodes. Incidental CT findings: Aortic atherosclerosis identified.  Bilateral kidney cysts noted. Small hiatal hernia. Prostate gland appears enlarged. SKELETON: The index large lucent lesion within the superior right acetabulum is again noted. This measures 6.5 cm and has mild increased tracer uptake. This contains diffuse low-level increased radiotracer uptake with a focal area of more intense uptake within SUV max of 3.92, image 191/4. On the previous exam there was relatively homogeneous uptake within this lesion within SUV max of 2.43. No significant tracer uptake within the 3 cm lucent lesion within the anterior left acetabulum, image 191/4 compatible with a treated bone lesion. Treated lesion within the posterior column of the right acetabulum measures 3.8 x 2.3 cm an has an SUV max of 1.51. This is unchanged from the previous exam. Incidental CT findings: none EXTREMITIES: No abnormal hypermetabolic activity in the lower extremities. Incidental CT findings: none IMPRESSION: 1. There is persistent low level radiotracer uptake associated with the large lucent lesion within the superior. Additionally, there is a new focal area of increased radiotracer uptake above the background low level activity within this lesion with SUV max of 3.92. On the previous exam there was relatively homogeneous low level uptake within this lesion within SUV max of 2.43. Imaging findings are concerning for residual metabolically active tumor. 2. No significant tracer uptake identified within the remaining treated bone lesions. No new sites of disease identified. 3. Stable 4.3 cm ascending thoracic aortic aneurysm. Recommend annual imaging followup by CTA or MRA. This recommendation follows 2010 ACCF/AHA/AATS/ACR/ASA/SCA/SCAI/SIR/STS/SVM Guidelines for the Diagnosis and Management of Patients with Thoracic Aortic Disease. Circulation. 2010; 121: H885-O277. Aortic aneurysm NOS (ICD10-I71.9) 4. Coronary artery calcifications 5.  Aortic Atherosclerosis (ICD10-I70.0). Electronically Signed   By: Kerby Moors M.D.   On: 10/17/2021 13:43   CT Biopsy  Result Date: 11/03/2021 INDICATION: Multiple myeloma EXAM: CT GUIDED BONE MARROW ASPIRATION AND CORE BIOPSY MEDICATIONS: None. ANESTHESIA/SEDATION: Moderate (conscious) sedation was  employed during this procedure. A total of 2 milligrams versed and 100 micrograms fentanyl were administered intravenously. The patient's level of consciousness and vital signs were monitored continuously by radiology nursing throughout the procedure under my direct supervision. Total monitored sedation time: 13 minutes FLUOROSCOPY TIME:  CT dose in mGy was not provided. COMPLICATIONS: None immediate. Estimated blood loss: <5 mL PROCEDURE: Informed written consent was obtained from the patient after a thorough discussion of the procedural risks, benefits and alternatives. All questions were addressed. Maximal Sterile Barrier Technique was utilized including caps, mask, sterile gowns, sterile gloves, sterile drape, hand hygiene and skin antiseptic. A timeout was performed prior to the initiation of the procedure. The patient was positioned prone and non-contrast localization CT was performed of the pelvis to demonstrate the iliac marrow spaces. Maximal barrier sterile technique utilized including caps, mask, sterile gowns, sterile gloves, large sterile drape, hand hygiene, and chlorhexidine prep. Under sterile conditions and local anesthesia, an 11 gauge coaxial bone biopsy needle was advanced into the RIGHT iliac marrow space. Needle position was confirmed with CT imaging. Initially, bone marrow aspiration was performed. Next, the 11 gauge outer cannula was utilized to obtain a 1 iliac bone marrow core biopsy. Needle was removed. Hemostasis was obtained with compression. The patient tolerated the procedure well. Samples were prepared with the cytotechnologist. IMPRESSION: Successful CT-guided bone marrow aspiration and biopsy, as above. Michaelle Birks, MD Vascular and Interventional  Radiology Special[facility] Radiology Electronically Signed   By: Michaelle Birks M.D.   On: 11/03/2021 19:56   CT BONE MARROW BIOPSY & ASPIRATION  Result Date: 11/03/2021 INDICATION: Multiple myeloma EXAM: CT GUIDED BONE MARROW ASPIRATION AND CORE BIOPSY MEDICATIONS: None. ANESTHESIA/SEDATION: Moderate (conscious) sedation was employed during this procedure. A total of 2 milligrams versed and 100 micrograms fentanyl were administered intravenously. The patient's level of consciousness and vital signs were monitored continuously by radiology nursing throughout the procedure under my direct supervision. Total monitored sedation time: 13 minutes FLUOROSCOPY TIME:  CT dose in mGy was not provided. COMPLICATIONS: None immediate. Estimated blood loss: <5 mL PROCEDURE: Informed written consent was obtained from the patient after a thorough discussion of the procedural risks, benefits and alternatives. All questions were addressed. Maximal Sterile Barrier Technique was utilized including caps, mask, sterile gowns, sterile gloves, sterile drape, hand hygiene and skin antiseptic. A timeout was performed prior to the initiation of the procedure. The patient was positioned prone and non-contrast localization CT was performed of the pelvis to demonstrate the iliac marrow spaces. Maximal barrier sterile technique utilized including caps, mask, sterile gowns, sterile gloves, large sterile drape, hand hygiene, and chlorhexidine prep. Under sterile conditions and local anesthesia, an 11 gauge coaxial bone biopsy needle was advanced into the RIGHT iliac marrow space. Needle position was confirmed with CT imaging. Initially, bone marrow aspiration was performed. Next, the 11 gauge outer cannula was utilized to obtain a 1 iliac bone marrow core biopsy. Needle was removed. Hemostasis was obtained with compression. The patient tolerated the procedure well. Samples were prepared with the cytotechnologist. IMPRESSION: Successful  CT-guided bone marrow aspiration and biopsy, as above. Michaelle Birks, MD Vascular [facility] Interventional Radiology Specialists [facility] Radiology Electronically Signed   By: Michaelle Birks M.D.   On: 11/03/2021 19:56    ASSESSMENT & PLAN:   81 yo   #1 Plasma cell myeloma-currently in remission.  03/28/2019 MRI pelvis w/wo contrast revealed "1. Destructive bone lesions as detailed above. Findings most consistent with metastatic disease. PET-CT may be helpful for  further evaluation and to establish a primary tumor. The right pelvic bone lesions should be amenable to image guided biopsy but a PET scan may demonstrate easier/safer biopsy sites. 2. No intrapelvic mass or adenopathy. 3. Benign intraosseous lipoma involving the left anterior superior acetabulum."  04/06/2019 PET whole body revealed "1. Diffuse osseous metastatic disease as detailed above without findings for a primary neoplasm in the chest, abdomen or pelvis. The large destructive lesion involving the right ischium should be amenable to image guided biopsy. 2. Two small retroperitoneal lymph nodes and 1 small right obturator node showing hypermetabolism."   04/16/2019 Posterior right pelvis bone biopsy revealed PLASMA CELL NEOPLASM  05/24/2019 Bone Marrow Biopsy revealed "BONE MARROW: - CELLULAR MARROW WITH INVOLVEMENT BY PLASMA CELL NEOPLASM (20%) PERIPHERAL BLOOD: - MORPHOLOGICALLY UNREMARKABLE"  05/24/2019 FISH Panel revealed "ABNORMAL result with 11q+, 14q+ and +17"  #2 Severe aortic stenosis with bicuspid aortic valve -10/26/2018 ECHO revealed AVA at 0.8 cm2 and LV EF of 60-65% -05/08/2019 pt had a Transfemoral Transcatheter Aortic Valve Replacement   PLAN: -Discussed available labs -Discussed PET CT scan from 2/2 which shows There is persistent low level radiotracer uptake associated with the large lucent lesion within the superior right acetabulum. Additionally, there is a new focal area of increased radiotracer uptake above  the background low level activity within this lesion with SUV max of 3.92. On the previous exam there was relatively homogeneous low level uptake within this lesion within SUV max of 2.43. Imaging findings are concerning for residual metabolically active tumor.  -We will refer to radiation oncology for consideration of palliative radiation to this FDG avid lesion to avoid risk of fracture. -Patient continues to be on Zometa -He is getting his bone marrow biopsy on 11/03/2021 which we shall follow-up on. -We discussed that if he has low-volume disease as be expected we will be starting him on maintenance Revlimid 10 mg p.o. 3 weeks on 1 week off he is agreeable to this. -He is already on Eliquis for A-fib and will continue this for VTE prophylaxis as well. -Continue Gabapentin 600 mg p.o. at bedtime.  -Continue Oxycodone prn for neuropathy pain -Continue Zometa q9months.  FOLLOW UP: CT bone marrow aspiration and biopsy in 1 week Referral to radiation oncology Starting maintenance Revlimid Phone visit with Dr Irene Limbo in 2 weeks  The total time spent in the appointment was 30 minutes*.  All of the patient's questions were answered with apparent satisfaction. The patient knows to call the clinic with any problems, questions or concerns.   Sullivan Lone MD MS AAHIVMS [facility] Hematology/Oncology Physician [facility]  .*Total Encounter Time as defined by the Centers for Medicare and Medicaid Services includes, in addition to the face-to-face time of a patient visit (documented in the note above) non-face-to-face time: obtaining and reviewing outside history, ordering and reviewing medications, tests or procedures, care coordination (communications with other health care professionals or caregivers) and documentation in the medical record.              Marland KitchenMarland Kitchen

## 2021-11-05 NOTE — Addendum Note (Signed)
Addended by: Sullivan Lone on: 11/05/2021 12:12 AM   Modules accepted: Orders

## 2021-11-09 ENCOUNTER — Encounter (HOSPITAL_COMMUNITY): Payer: Self-pay | Admitting: Hematology

## 2021-11-11 NOTE — Progress Notes (Signed)
Histology and Location of Primary Cancer: Multiple Myeloma ? ?Location(s) of Symptomatic Metastases: Right Acetabulum ? ?Pathology Report: Bone Marrow Aspirate 11/03/2021 ? ? ? ?Past/Anticipated chemotherapy by medical oncology, if any:  ?Dr. Irene Limbo 10/29/2021 ?--Discussed PET CT scan from 2/2 which shows There is persistent low level radiotracer uptake associated with the large lucent lesion within the superior right acetabulum. ?-Additionally, there is a new focal area of increased radiotracer uptake above the background low level activity within this lesion with SUV max of 3.92. On the previous exam there was relatively homogeneous low level uptake within this lesion within SUV max of 2.43. Imaging findings are concerning for residual metabolically active tumor. ?-We will refer to radiation oncology for consideration of palliative radiation to this FDG avid lesion to avoid risk of fracture. ?-Patient continues to be on Zometa. ?-He is getting his bone marrow biopsy on 11/03/2021 which we shall follow-up on. ?-We discussed that if he has low-volume disease as be expected we will be starting him on maintenance Revlimid 10 mg p.o. 3 weeks on 1 week off he is agreeable to this. ? ? ? ?Pain on a scale of 0-10 is:   ? ? ?Ambulatory status? Walker? Wheelchair?: Ambulatory with a Cane. ? ?SAFETY ISSUES: ?Prior radiation? No ?Pacemaker/ICD? No ?Possible current pregnancy? N/A ?Is the patient on methotrexate? No ? ?Current Complaints / other details:   ? ? ?

## 2021-11-12 ENCOUNTER — Ambulatory Visit
Admission: RE | Admit: 2021-11-12 | Discharge: 2021-11-12 | Disposition: A | Discharge status: Home / Self Care | Payer: Medicare Other | Source: Ambulatory Visit | Attending: Radiation Oncology | Admitting: Radiation Oncology

## 2021-11-12 ENCOUNTER — Other Ambulatory Visit (HOSPITAL_COMMUNITY): Payer: Self-pay

## 2021-11-12 ENCOUNTER — Other Ambulatory Visit: Payer: Self-pay

## 2021-11-12 ENCOUNTER — Encounter: Payer: Self-pay | Admitting: Radiation Oncology

## 2021-11-12 ENCOUNTER — Telehealth: Payer: Self-pay | Admitting: Pharmacist

## 2021-11-12 VITALS — BP 135/77 | HR 57 | Temp 97.8°F | Resp 20 | Ht 70.0 in | Wt 184.4 lb

## 2021-11-12 DIAGNOSIS — C7951 Secondary malignant neoplasm of bone: Secondary | ICD-10-CM

## 2021-11-12 DIAGNOSIS — E876 Hypokalemia: Secondary | ICD-10-CM | POA: Insufficient documentation

## 2021-11-12 DIAGNOSIS — M1990 Unspecified osteoarthritis, unspecified site: Secondary | ICD-10-CM | POA: Insufficient documentation

## 2021-11-12 DIAGNOSIS — C9000 Multiple myeloma not having achieved remission: Secondary | ICD-10-CM | POA: Diagnosis not present

## 2021-11-12 DIAGNOSIS — I2510 Atherosclerotic heart disease of native coronary artery without angina pectoris: Secondary | ICD-10-CM | POA: Insufficient documentation

## 2021-11-12 DIAGNOSIS — Z79899 Other long term (current) drug therapy: Secondary | ICD-10-CM | POA: Insufficient documentation

## 2021-11-12 DIAGNOSIS — K219 Gastro-esophageal reflux disease without esophagitis: Secondary | ICD-10-CM | POA: Insufficient documentation

## 2021-11-12 DIAGNOSIS — I4891 Unspecified atrial fibrillation: Secondary | ICD-10-CM | POA: Diagnosis not present

## 2021-11-12 DIAGNOSIS — I480 Paroxysmal atrial fibrillation: Secondary | ICD-10-CM | POA: Diagnosis not present

## 2021-11-12 DIAGNOSIS — Z7901 Long term (current) use of anticoagulants: Secondary | ICD-10-CM | POA: Insufficient documentation

## 2021-11-12 DIAGNOSIS — K760 Fatty (change of) liver, not elsewhere classified: Secondary | ICD-10-CM | POA: Diagnosis not present

## 2021-11-12 DIAGNOSIS — I700 Atherosclerosis of aorta: Secondary | ICD-10-CM | POA: Insufficient documentation

## 2021-11-12 DIAGNOSIS — I10 Essential (primary) hypertension: Secondary | ICD-10-CM | POA: Insufficient documentation

## 2021-11-12 DIAGNOSIS — Z800 Family history of malignant neoplasm of digestive organs: Secondary | ICD-10-CM | POA: Diagnosis not present

## 2021-11-12 DIAGNOSIS — K449 Diaphragmatic hernia without obstruction or gangrene: Secondary | ICD-10-CM | POA: Insufficient documentation

## 2021-11-12 DIAGNOSIS — E7800 Pure hypercholesterolemia, unspecified: Secondary | ICD-10-CM | POA: Diagnosis not present

## 2021-11-12 NOTE — Telephone Encounter (Signed)
Oral Oncology Pharmacist Encounter ? ?Received new prescription for Revlimid (lenalidomide) for the maintenance treatment of multiple myeloma, planned duration until disease progression or unacceptable drug toxicity. ? ?CBC w/ Diff from 11/03/21 and CMP from 10/15/21 assessed, noted Scr of 1.30 mg/dL (CrCl ~ 52.4 mL/min). Message sent to Dr. Irene Limbo to confirm if he would like to dose reduce patient for first cycle of Revlimid to 5 mg PO daily for 21 days on/7 days off, repeat every 28 days given renal dysfunction. ? ?Current medication list in Epic reviewed, no relevant/significant DDIs with Revlimid identified. ? ?Evaluated chart and no patient barriers to medication adherence noted.  ? ?Patient agreement for treatment documented in MD note on 10/29/21. ? ?Oral Oncology Clinic will continue to follow for insurance authorization, copayment issues, initial counseling and start date. ? ?Leron Croak, PharmD, BCPS ?Hematology/Oncology Clinical Pharmacist ?[facility] Oral Chemotherapy Navigation Clinics ?[phone] ?11/12/2021 11:22 AM ? ?

## 2021-11-13 ENCOUNTER — Other Ambulatory Visit (HOSPITAL_COMMUNITY): Payer: Self-pay

## 2021-11-13 ENCOUNTER — Inpatient Hospital Stay: Payer: Medicare Other | Attending: Hematology | Admitting: Hematology

## 2021-11-13 ENCOUNTER — Encounter (HOSPITAL_COMMUNITY): Payer: Self-pay | Admitting: Hematology

## 2021-11-13 DIAGNOSIS — C9001 Multiple myeloma in remission: Secondary | ICD-10-CM | POA: Insufficient documentation

## 2021-11-13 DIAGNOSIS — I4891 Unspecified atrial fibrillation: Secondary | ICD-10-CM | POA: Diagnosis not present

## 2021-11-13 DIAGNOSIS — I10 Essential (primary) hypertension: Secondary | ICD-10-CM | POA: Insufficient documentation

## 2021-11-13 DIAGNOSIS — I350 Nonrheumatic aortic (valve) stenosis: Secondary | ICD-10-CM | POA: Insufficient documentation

## 2021-11-13 DIAGNOSIS — C9000 Multiple myeloma not having achieved remission: Secondary | ICD-10-CM | POA: Diagnosis not present

## 2021-11-13 NOTE — Progress Notes (Signed)
Radiation Oncology         [phone] ________________________________  Name: [name] [name]        MRN: [id number]  Date of Service: 11/12/2021 DOB: 1941/05/13  SE:[id number], [name] Lacks, MD  Brunetta Genera, MD     REFERRING PHYSICIAN: Brunetta Genera, MD   DIAGNOSIS: The primary encounter diagnosis was Bone metastases [facility]). A diagnosis of Multiple myeloma not having achieved remission [facility]) was also pertinent to this visit.   HISTORY OF PRESENT ILLNESS: [name] [name] is a 81 y.o. male seen at the request of Dr. Irene Limbo with a diagnosis of multiple myeloma originally diagnosed in 2020 and treated with Velcade. He has been followed closely in remission and recent PET scan on 10/15/21 showed hypermetabolic uptake in the right acetabulum. Bone marrow biopsy on 11/03/21 showed 2 % plasma cells. He's seen today to discuss palliative radiotherapy to reduce risks of fracture.     PREVIOUS RADIATION THERAPY: No   PAST MEDICAL HISTORY:  Past Medical History:  Diagnosis Date   Ascending aortic aneurysm    Bicuspid aortic valve    Cancer (HCC)    CHF NYHA class I (no symptoms from ordinary activities), acute, diastolic ([place])    Dysrhythmia 2009   A fib   Fatty liver    mild   Fracture    left proximal humerus   GERD (gastroesophageal reflux disease)    GI bleeding 07/21/2018   post polypectomy   Hemorrhoids    HTN (hypertension)    Hypercholesteremia    Hypokalemia    Internal hemorrhoids    LBP (low back pain)    Moderate aortic stenosis    Osteoarthritis    Paroxysmal atrial fibrillation ([place])    a. new onset Afib in 07/2008. He underwent ibutilide cardioversion successfully. b. Recurrence 01/2013 s/p TEE/DCCV - was on Xarelto but he stopped it as he was convinced it was causing joint pn. c. Recurrence 01/2016 - spont conv to NSR. Pt took Eliquis x1 mo then declined further anticoag. d. Recurrence 07/2016.   Pneumonia    Tubular adenoma of colon        PAST SURGICAL  HISTORY: Past Surgical History:  Procedure Laterality Date   BACK SURGERY  x12 years ago   [place] VALVE REPLACEMENT  2020   CARDIOVERSION N/A 01/26/2013   Procedure: CARDIOVERSION;  Surgeon: Larey Dresser, MD;  Location: [place];  Service: Cardiovascular;  Laterality: N/A;   CARDIOVERSION N/A 10/28/2017   Procedure: CARDIOVERSION;  Surgeon: Larey Dresser, MD;  Location: [place];  Service: Cardiovascular;  Laterality: N/A;   CARDIOVERSION N/A 03/03/2018   Procedure: CARDIOVERSION;  Surgeon: Lelon Perla, MD;  Location: [facility] ENDOSCOPY;  Service: Cardiovascular;  Laterality: N/A;   CARDIOVERSION N/A 09/19/2019   Procedure: CARDIOVERSION;  Surgeon: Larey Dresser, MD;  Location: [facility] ENDOSCOPY;  Service: Cardiovascular;  Laterality: N/A;   COLONOSCOPY     COLONOSCOPY  07/17/2018   at [place] Left 03/18/2020   Procedure: OPEN LEFT INGUINAL HERNIA REPAIR;  Surgeon: Alphonsa Overall, MD;  Location: [place];  Service: General;  Laterality: Left;   LUMBAR LAMINECTOMY     ORIF HUMERUS FRACTURE Left 01/15/2020   Procedure: OPEN REDUCTION INTERNAL FIXATION (ORIF) PROXIMAL HUMERUS FRACTURE;  Surgeon: Nicholes Stairs, MD;  Location: [place];  Service: Orthopedics;  Laterality: Left;   POLYPECTOMY     RIGHT HEART CATH N/A  08/20/2019   Procedure: RIGHT HEART CATH;  Surgeon: Larey Dresser, MD;  Location: [place] CV LAB;  Service: Cardiovascular;  Laterality: N/A;   RIGHT/LEFT HEART CATH AND CORONARY ANGIOGRAPHY N/A 03/07/2019   Procedure: RIGHT/LEFT HEART CATH AND CORONARY ANGIOGRAPHY;  Surgeon: Burnell Blanks, MD;  Location: [place] CV LAB;  Service: Cardiovascular;  Laterality: N/A;   TAVAR  04/2019   TEE WITHOUT CARDIOVERSION N/A 01/26/2013   Procedure: TRANSESOPHAGEAL ECHOCARDIOGRAM (TEE);  Surgeon: Larey Dresser, MD;  Location: [place];  Service: Cardiovascular;  Laterality: N/A;   TEE  WITHOUT CARDIOVERSION N/A 10/28/2017   Procedure: TRANSESOPHAGEAL ECHOCARDIOGRAM (TEE);  Surgeon: Larey Dresser, MD;  Location: [facility] ENDOSCOPY;  Service: Cardiovascular;  Laterality: N/A;   TEE WITHOUT CARDIOVERSION N/A 05/08/2019   Procedure: TRANSESOPHAGEAL ECHOCARDIOGRAM (TEE);  Surgeon: Burnell Blanks, MD;  Location: [place] CV LAB;  Service: Open Heart Surgery;  Laterality: N/A;   TEE WITHOUT CARDIOVERSION N/A 09/19/2019   Procedure: TRANSESOPHAGEAL ECHOCARDIOGRAM (TEE);  Surgeon: Larey Dresser, MD;  Location: [facility] ENDOSCOPY;  Service: Cardiovascular;  Laterality: N/A;   TRANSCATHETER AORTIC VALVE REPLACEMENT, TRANSFEMORAL N/A 05/08/2019   Procedure: TRANSCATHETER AORTIC VALVE REPLACEMENT, TRANSFEMORAL;  Surgeon: Burnell Blanks, MD;  Location: [place] CV LAB;  Service: Open Heart Surgery;  Laterality: N/A;     FAMILY HISTORY:  Family History  Problem Relation Age of Onset   Colon cancer Mother 42   Hypertension Other    Coronary artery disease Neg Hx    Colon polyps Neg Hx    Esophageal cancer Neg Hx    Rectal cancer Neg Hx    Stomach cancer Neg Hx      SOCIAL HISTORY:  reports that he has never smoked. He has never used smokeless tobacco. He reports that he does not currently use alcohol. He reports that he does not use drugs.   ALLERGIES: Xarelto [rivaroxaban], Corticosteroids, Ramipril, Zolpidem, and Benazepril   MEDICATIONS:  Current Outpatient Medications  Medication Sig Dispense Refill   b complex vitamins tablet Take 1 tablet by mouth daily. 100 tablet 3   Calcium-Magnesium 500-250 MG TABS Take 1 tablet by mouth daily.     Carboxymethylcellul-Glycerin (LUBRICATING EYE DROPS OP) Place 1 drop into both eyes daily as needed (dry eyes).     Cholecalciferol (VITAMIN D) 50 MCG (2000 UT) tablet Take 2,000 Units by mouth daily.     diclofenac Sodium (VOLTAREN) 1 % GEL Apply 1 application topically 2 (two) times daily as needed (pain.).     ELIQUIS 5 MG  TABS tablet TAKE 1 TABLET BY MOUTH TWICE A DAY 60 tablet 11   gabapentin (NEURONTIN) 300 MG capsule Take 300 mg by mouth at bedtime.     ipratropium (ATROVENT) 0.06 % nasal spray Place 2 sprays into the nose 3 (three) times daily. 15 mL 2   lenalidomide (REVLIMID) 10 MG capsule Take 1 capsule (10 mg total) by mouth daily. 21 capsule 1   metoprolol succinate (TOPROL-XL) 25 MG 24 hr tablet TAKE 1 TABLET BY MOUTH TWICE A DAY 180 tablet 3   metroNIDAZOLE (METROCREAM) 0.75 % cream Apply 1 application topically 2 (two) times daily.     oxyCODONE (OXY IR/ROXICODONE) 5 MG immediate release tablet Take 1-2 tablets (5-10 mg total) by mouth 2 (two) times daily as needed for severe pain. 60 tablet 0   polyethylene glycol powder (GLYCOLAX/MIRALAX) 17 GM/SCOOP powder Take 17-34 g by mouth 2 (two) times daily as needed for moderate constipation. [place]  g 5   senna-docusate (SENOKOT-S) 8.6-50 MG tablet Take 2 tablets by mouth at bedtime.     spironolactone (ALDACTONE) 25 MG tablet TAKE 1 TABLET BY MOUTH EVERY DAY 90 tablet 3   Vitamin D, Ergocalciferol, (DRISDOL) 1.25 MG (50000 UNIT) CAPS capsule TAKE 1 CAPSULE BY MOUTH ONE TIME PER WEEK 12 capsule 3   No current facility-administered medications for this encounter.     REVIEW OF SYSTEMS: On review of systems, the patient reports that he is doing pretty well. He does have residual symptoms of neuropathy possibly from prior systemic therapy. He reports some pain in his right upper thigh area but also states this does not bother him enough to take regular medication for. He reports no recent weakness or instability of the right leg. No other complaints are verbalized.      PHYSICAL EXAM:  Wt Readings from Last 3 Encounters:  11/12/21 184 lb 6.4 oz (83.6 kg)  11/03/21 183 lb 3.2 oz (83.1 kg)  10/29/21 183 lb 3.2 oz (83.1 kg)   Temp Readings from Last 3 Encounters:  11/12/21 97.8 F (36.6 C)  11/03/21 97.6 F (36.4 C) (Oral)  10/29/21 98.2 F (36.8 C)  (Tympanic)   BP Readings from Last 3 Encounters:  11/12/21 135/77  11/03/21 118/70  10/29/21 131/67   Pulse Readings from Last 3 Encounters:  11/12/21 (!) 57  11/03/21 (!) 53  10/29/21 66    In general this is a well appearing male in no acute distress. He's alert and oriented x4 and appropriate throughout the examination. Cardiopulmonary assessment is negative for acute distress and he exhibits normal effort.     ECOG = 0-1  0 - Asymptomatic (Fully active, able to carry on all predisease activities without restriction)  1 - Symptomatic but completely ambulatory (Restricted in physically strenuous activity but ambulatory and able to carry out work of a light or sedentary nature. For example, light housework, office work)  2 - Symptomatic, <50% in bed during the day (Ambulatory and capable of all self care but unable to carry out any work activities. Up and about more than 50% of waking hours)  3 - Symptomatic, >50% in bed, but not bedbound (Capable of only limited self-care, confined to bed or chair 50% or more of waking hours)  4 - Bedbound (Completely disabled. Cannot carry on any self-care. Totally confined to bed or chair)  5 - Death   Eustace Pen MM, Creech RH, Tormey DC, et al. [phone]). "Toxicity and response criteria of the [facility]". [place] Oncol. 5 (6): 649-55    LABORATORY DATA:  Lab Results  Component Value Date   WBC 6.0 11/03/2021   HGB 15.2 11/03/2021   HCT 47.6 11/03/2021   MCV 93.0 11/03/2021   PLT 144 (L) 11/03/2021   Lab Results  Component Value Date   NA 141 10/15/2021   K 4.1 10/15/2021   CL 106 10/15/2021   CO2 30 10/15/2021   Lab Results  Component Value Date   ALT 20 10/15/2021   AST 22 10/15/2021   ALKPHOS 55 10/15/2021   BILITOT 0.6 10/15/2021      RADIOGRAPHY: NM PET Image Restage (PS) Whole Body  Result Date: 10/17/2021 CLINICAL DATA:  Subsequent treatment strategy for multiple myeloma. EXAM: NUCLEAR  MEDICINE PET WHOLE BODY TECHNIQUE: 9.25 mCi F-18 FDG was injected intravenously. Full-ring PET imaging was performed from the head to foot after the radiotracer. CT data was obtained and used for attenuation correction  and anatomic localization. Fasting blood glucose: 98 mg/dl COMPARISON:  11/21/2019 FINDINGS: Mediastinal blood pool activity: SUV max 2.89 HEAD/NECK: No hypermetabolic activity in the scalp. No hypermetabolic cervical lymph nodes. Incidental CT findings: none CHEST: No hypermetabolic mediastinal or hilar nodes. No suspicious pulmonary nodules on the CT scan. Incidental CT findings: Aortic atherosclerosis. 4.3 cm ascending thoracic aortic aneurysm is unchanged. Status post TAVR. Coronary artery calcifications noted. ABDOMEN/PELVIS: No abnormal FDG uptake within the liver, pancreas, spleen, or adrenal glands. No tracer avid abdominal or pelvic lymph nodes. Incidental CT findings: Aortic atherosclerosis identified. Bilateral kidney cysts noted. Small hiatal hernia. Prostate gland appears enlarged. SKELETON: The index large lucent lesion within the superior right acetabulum is again noted. This measures 6.5 cm and has mild increased tracer uptake. This contains diffuse low-level increased radiotracer uptake with a focal area of more intense uptake within SUV max of 3.92, image 191/4. On the previous exam there was relatively homogeneous uptake within this lesion within SUV max of 2.43. No significant tracer uptake within the 3 cm lucent lesion within the anterior left acetabulum, image 191/4 compatible with a treated bone lesion. Treated lesion within the posterior column of the right acetabulum measures 3.8 x 2.3 cm an has an SUV max of 1.51. This is unchanged from the previous exam. Incidental CT findings: none EXTREMITIES: No abnormal hypermetabolic activity in the lower extremities. Incidental CT findings: none IMPRESSION: 1. There is persistent low level radiotracer uptake associated with the large  lucent lesion within the superior. Additionally, there is a new focal area of increased radiotracer uptake above the background low level activity within this lesion with SUV max of 3.92. On the previous exam there was relatively homogeneous low level uptake within this lesion within SUV max of 2.43. Imaging findings are concerning for residual metabolically active tumor. 2. No significant tracer uptake identified within the remaining treated bone lesions. No new sites of disease identified. 3. Stable 4.3 cm ascending thoracic aortic aneurysm. Recommend annual imaging followup by CTA or MRA. This recommendation follows 2010 ACCF/AHA/AATS/ACR/ASA/SCA/SCAI/SIR/STS/SVM Guidelines for the Diagnosis and Management of Patients with Thoracic Aortic Disease. Circulation. 2010; 121: N829-F621. Aortic aneurysm NOS (ICD10-I71.9) 4. Coronary artery calcifications 5.  Aortic Atherosclerosis (ICD10-I70.0). Electronically Signed   By: Kerby Moors M.D.   On: 10/17/2021 13:43   CT Biopsy  Result Date: 11/03/2021 INDICATION: Multiple myeloma EXAM: CT GUIDED BONE MARROW ASPIRATION AND CORE BIOPSY MEDICATIONS: None. ANESTHESIA/SEDATION: Moderate (conscious) sedation was employed during this procedure. A total of 2 milligrams versed and 100 micrograms fentanyl were administered intravenously. The patient's level of consciousness and vital signs were monitored continuously by radiology nursing throughout the procedure under my direct supervision. Total monitored sedation time: 13 minutes FLUOROSCOPY TIME:  CT dose in mGy was not provided. COMPLICATIONS: None immediate. Estimated blood loss: <5 mL PROCEDURE: Informed written consent was obtained from the patient after a thorough discussion of the procedural risks, benefits and alternatives. All questions were addressed. Maximal Sterile Barrier Technique was utilized including caps, mask, sterile gowns, sterile gloves, sterile drape, hand hygiene and skin antiseptic. A timeout was  performed prior to the initiation of the procedure. The patient was positioned prone and non-contrast localization CT was performed of the pelvis to demonstrate the iliac marrow spaces. Maximal barrier sterile technique utilized including caps, mask, sterile gowns, sterile gloves, large sterile drape, hand hygiene, and chlorhexidine prep. Under sterile conditions and local anesthesia, an 11 gauge coaxial bone biopsy needle was advanced into the RIGHT iliac marrow space.  Needle position was confirmed with CT imaging. Initially, bone marrow aspiration was performed. Next, the 11 gauge outer cannula was utilized to obtain a 1 iliac bone marrow core biopsy. Needle was removed. Hemostasis was obtained with compression. The patient tolerated the procedure well. Samples were prepared with the cytotechnologist. IMPRESSION: Successful CT-guided bone marrow aspiration and biopsy, as above. Michaelle Birks, MD [facility] Radiology Electronically Signed   By: Michaelle Birks M.D.   On: 11/03/2021 19:56   CT BONE MARROW BIOPSY & ASPIRATION  Result Date: 11/03/2021 INDICATION: Multiple myeloma EXAM: CT GUIDED BONE MARROW ASPIRATION AND CORE BIOPSY MEDICATIONS: None. ANESTHESIA/SEDATION: Moderate (conscious) sedation was employed during this procedure. A total of 2 milligrams versed and 100 micrograms fentanyl were administered intravenously. The patient's level of consciousness and vital signs were monitored continuously by radiology nursing throughout the procedure under my direct supervision. Total monitored sedation time: 13 minutes FLUOROSCOPY TIME:  CT dose in mGy was not provided. COMPLICATIONS: None immediate. Estimated blood loss: <5 mL PROCEDURE: Informed written consent was obtained from the patient after a thorough discussion of the procedural risks, benefits and alternatives. All questions were addressed. Maximal Sterile Barrier Technique was utilized including caps,  mask, sterile gowns, sterile gloves, sterile drape, hand hygiene and skin antiseptic. A timeout was performed prior to the initiation of the procedure. The patient was positioned prone and non-contrast localization CT was performed of the pelvis to demonstrate the iliac marrow spaces. Maximal barrier sterile technique utilized including caps, mask, sterile gowns, sterile gloves, large sterile drape, hand hygiene, and chlorhexidine prep. Under sterile conditions and local anesthesia, an 11 gauge coaxial bone biopsy needle was advanced into the RIGHT iliac marrow space. Needle position was confirmed with CT imaging. Initially, bone marrow aspiration was performed. Next, the 11 gauge outer cannula was utilized to obtain a 1 iliac bone marrow core biopsy. Needle was removed. Hemostasis was obtained with compression. The patient tolerated the procedure well. Samples were prepared with the cytotechnologist. IMPRESSION: Successful CT-guided bone marrow aspiration and biopsy, as above. Michaelle Birks, MD [facility] Radiology Electronically Signed   By: Michaelle Birks M.D.   On: 11/03/2021 19:56       IMPRESSION/PLAN: 1. Multiple Myeloma with radiographic evidence of recurrence in the right acetabulum. Dr. Lisbeth Renshaw discusses the patient's course to date and the imaging findings. We discussed the rationale for a palliative course of radiotherapy to the right acetabulum to reduce risks of pain and destruction that could lead to fracture. We discussed the risks, benefits, short, and long term effects of radiotherapy, as well as the palliative intent, and the patient is interested in proceeding. Dr. Lisbeth Renshaw discusses the delivery and logistics of radiotherapy and anticipates a course of 2 weeks of radiotherapy to the right acetabulum. Written consent is obtained and placed in the chart, a copy was provided to the patient. He will be contacted by our staff to coordinate simulation.    In a visit lasting 60 minutes, greater than 50% of the time was spent face to face discussing the patient's condition, in preparation for the discussion, and coordinating the patient's care.   The above documentation reflects my direct findings during this shared patient visit. Please see the separate note by Dr. Lisbeth Renshaw on this date for the remainder of the patient's plan of care.    Carola Rhine, [facility]   **Disclaimer: This note was dictated with voice recognition software. Similar sounding words can inadvertently be  transcribed and this note may contain transcription errors which may not have been corrected upon publication of note.**

## 2021-11-20 ENCOUNTER — Other Ambulatory Visit (HOSPITAL_COMMUNITY): Payer: Self-pay

## 2021-11-20 ENCOUNTER — Inpatient Hospital Stay: Payer: Medicare Other | Admitting: Hematology

## 2021-11-20 ENCOUNTER — Inpatient Hospital Stay: Payer: Medicare Other

## 2021-11-20 ENCOUNTER — Other Ambulatory Visit: Payer: Self-pay

## 2021-11-20 ENCOUNTER — Encounter: Payer: Self-pay | Admitting: Hematology

## 2021-11-20 ENCOUNTER — Ambulatory Visit
Admission: RE | Admit: 2021-11-20 | Discharge: 2021-11-20 | Disposition: A | Discharge status: Home / Self Care | Payer: Medicare Other | Source: Ambulatory Visit | Attending: Radiation Oncology | Admitting: Radiation Oncology

## 2021-11-20 ENCOUNTER — Telehealth: Payer: Self-pay

## 2021-11-20 ENCOUNTER — Ambulatory Visit: Payer: Medicare Other

## 2021-11-20 DIAGNOSIS — C9000 Multiple myeloma not having achieved remission: Secondary | ICD-10-CM

## 2021-11-20 DIAGNOSIS — Z510 Encounter for antineoplastic radiation therapy: Secondary | ICD-10-CM | POA: Diagnosis not present

## 2021-11-20 DIAGNOSIS — Z7189 Other specified counseling: Secondary | ICD-10-CM

## 2021-11-20 DIAGNOSIS — C7951 Secondary malignant neoplasm of bone: Secondary | ICD-10-CM | POA: Insufficient documentation

## 2021-11-20 LAB — CMP (CANCER CENTER ONLY)
ALT: 22 U/L (ref 0–44)
AST: 24 U/L (ref 15–41)
Albumin: 4.5 g/dL (ref 3.5–5.0)
Alkaline Phosphatase: 58 U/L (ref 38–126)
Anion gap: 6 (ref 5–15)
BUN: 22 mg/dL (ref 8–23)
CO2: 27 mmol/L (ref 22–32)
Calcium: 9.8 mg/dL (ref 8.9–10.3)
Chloride: 108 mmol/L (ref 98–111)
Creatinine: 1.08 mg/dL (ref 0.61–1.24)
GFR, Estimated: >60 mL/min (ref >60)
Glucose, Bld: 90 mg/dL (ref 70–99)
Potassium: 4.2 mmol/L (ref 3.5–5.1)
Sodium: 141 mmol/L (ref 135–145)
Total Bilirubin: 0.6 mg/dL (ref 0.3–1.2)
Total Protein: 6.6 g/dL (ref 6.5–8.1)

## 2021-11-20 LAB — CBC WITH DIFFERENTIAL (CANCER CENTER ONLY)
Abs Immature Granulocytes: 0.02 10*3/uL (ref 0.00–0.07)
Basophils Absolute: 0 10*3/uL (ref 0.0–0.1)
Basophils Relative: 1 %
Eosinophils Absolute: 0.3 10*3/uL (ref 0.0–0.5)
Eosinophils Relative: 4 %
HCT: 45.1 % (ref 39.0–52.0)
Hemoglobin: 14.8 g/dL (ref 13.0–17.0)
Immature Granulocytes: 0 %
Lymphocytes Relative: 20 %
Lymphs Abs: 1.2 10*3/uL (ref 0.7–4.0)
MCH: 29.6 pg (ref 26.0–34.0)
MCHC: 32.8 g/dL (ref 30.0–36.0)
MCV: 90.2 fL (ref 80.0–100.0)
Monocytes Absolute: 0.6 10*3/uL (ref 0.1–1.0)
Monocytes Relative: 9 %
Neutro Abs: 4 10*3/uL (ref 1.7–7.7)
Neutrophils Relative %: 66 %
Platelet Count: 149 10*3/uL — ABNORMAL LOW (ref 150–400)
RBC: 5 MIL/uL (ref 4.22–5.81)
RDW: 12.7 % (ref 11.5–15.5)
WBC Count: 6.1 10*3/uL (ref 4.0–10.5)
nRBC: 0 % (ref 0.0–0.2)

## 2021-11-20 MED ORDER — LENALIDOMIDE 5 MG PO CAPS: 5.0000 mg | ORAL_CAPSULE | Freq: Every day | ORAL | 0 refills | Status: DC

## 2021-11-20 MED ORDER — SODIUM CHLORIDE 0.9 % IV SOLN: Freq: Once | INTRAVENOUS | Status: AC

## 2021-11-20 MED ORDER — ZOLEDRONIC ACID 4 MG/100ML IV SOLN
4.0000 mg | Freq: Once | INTRAVENOUS | Status: AC
  Administered 2021-11-20: 4 mg via INTRAVENOUS
  Filled 2021-11-20: qty 100

## 2021-11-20 NOTE — Progress Notes (Signed)
° ° °HEMATOLOGY/ONCOLOGY PHONE VISIT NOTE ° °Date of Service: .11/13/2021 ° ° °Patient Care Team: °[name], Aleksei V, MD as PCP - General °McLean, Dalton S, MD as PCP - Advanced Heart Failure (Cardiology) °McLean, Dalton S, MD as PCP - Cardiology (Cardiology) °Stern, Joseph, MD as Attending Physician (Neurosurgery) °Jones, Drew, MD as Consulting Physician (Dermatology) °Alva, Rakesh V, MD as Consulting Physician (Pulmonary Disease) °Patel, [name], DO as Consulting Physician (Neurology) ° ° °CHIEF COMPLAINTS/PURPOSE OF CONSULTATION:  °Follow-up for continued evaluation and management of multiple myeloma and review of bone marrow biopsy results ° ° °HISTORY OF PRESENTING ILLNESS:  °Please see previous notes for details on initial presentation ° °INTERVAL HISTORY: °.I connected with Natalie Burnstein on 11/13/2021 at  3:20 PM EST by telephone visit and verified that I am speaking with the correct person using two identifiers.  ° °I discussed the limitations, risks, security and privacy concerns of performing an evaluation and management service by telemedicine and the availability of in-person appointments. I also discussed with the patient that there may be a patient responsible charge related to this service. The patient expressed understanding and agreed to proceed.  ° °Other persons participating in the visit and their role in the encounter: Patient's wife ° °Patient’s location: Home °Provider’s location: [facility] ° °Chief Complaint: Follow-up for continued evaluation and management of multiple myeloma and review of bone marrow biopsy results. ° °Patient notes no acute new symptoms since his last clinic visit.  He has met with radiation oncology and they have offered him option of radiation to his FDG avid bone lesion about the right hip. °He had his bone marrow biopsy which we discussed in detail.  He has low burden of disease and we discussed that in addition to I-S RT to the bone lesion we  will proceed with our plan to start him on maintenance Revlimid treatment for suppression of his other disease. °He is agreeable to this.  No other acute new symptoms at this time.. ° ° MEDICAL HISTORY:  °Past Medical History:  °Diagnosis Date  ° Ascending aortic aneurysm   ° Bicuspid aortic valve   ° Cancer (HCC)   ° CHF NYHA class I (no symptoms from ordinary activities), acute, diastolic (HCC)   ° Dysrhythmia 2009  ° A fib  ° Fatty liver   ° mild  ° Fracture   ° left proximal humerus  ° GERD (gastroesophageal reflux disease)   ° GI bleeding 07/21/2018  ° post polypectomy  ° Hemorrhoids   ° HTN (hypertension)   ° Hypercholesteremia   ° Hypokalemia   ° Internal hemorrhoids   ° LBP (low back pain)   ° Moderate aortic stenosis   ° Osteoarthritis   ° Paroxysmal atrial fibrillation (HCC)   ° a. new onset Afib in 07/2008. He underwent ibutilide cardioversion successfully. b. Recurrence 01/2013 s/p TEE/DCCV - was on Xarelto but he stopped it as he was convinced it was causing joint pn. c. Recurrence 01/2016 - spont conv to NSR. Pt took Eliquis x1 mo then declined further anticoag. d. Recurrence 07/2016.  ° Pneumonia   ° Tubular adenoma of colon   ° ° °SURGICAL HISTORY: °Past Surgical History:  °Procedure Laterality Date  ° BACK SURGERY  x12 years ago  ° CARDIAC CATHETERIZATION  2020  ° CARDIAC VALVE REPLACEMENT  2020  ° CARDIOVERSION N/A 01/26/2013  ° Procedure: CARDIOVERSION;  Surgeon: Dalton S McLean, MD;  Location: [facility] ENDOSCOPY;  Service: Cardiovascular;  Laterality: N/A;  °   CARDIOVERSION N/A 10/28/2017   Procedure: CARDIOVERSION;  Surgeon: Larey Dresser, MD;  Location: [facility] ENDOSCOPY;  Service: Cardiovascular;  Laterality: N/A;   CARDIOVERSION N/A 03/03/2018   Procedure: CARDIOVERSION;  Surgeon: Lelon Perla, MD;  Location: [facility] ENDOSCOPY;  Service: Cardiovascular;  Laterality: N/A;   CARDIOVERSION N/A 09/19/2019   Procedure: CARDIOVERSION;  Surgeon: Larey Dresser, MD;  Location: [facility] ENDOSCOPY;  Service:  Cardiovascular;  Laterality: N/A;   COLONOSCOPY     COLONOSCOPY  07/17/2018   at [place] Left 03/18/2020   Procedure: OPEN LEFT INGUINAL HERNIA REPAIR;  Surgeon: Alphonsa Overall, MD;  Location: [place];  Service: General;  Laterality: Left;   LUMBAR LAMINECTOMY     ORIF HUMERUS FRACTURE Left 01/15/2020   Procedure: OPEN REDUCTION INTERNAL FIXATION (ORIF) PROXIMAL HUMERUS FRACTURE;  Surgeon: Nicholes Stairs, MD;  Location: [place];  Service: Orthopedics;  Laterality: Left;   POLYPECTOMY     RIGHT HEART CATH N/A 08/20/2019   Procedure: RIGHT HEART CATH;  Surgeon: Larey Dresser, MD;  Location: [place] CV LAB;  Service: Cardiovascular;  Laterality: N/A;   RIGHT/LEFT HEART CATH AND CORONARY ANGIOGRAPHY N/A 03/07/2019   Procedure: RIGHT/LEFT HEART CATH AND CORONARY ANGIOGRAPHY;  Surgeon: Burnell Blanks, MD;  Location: [place] CV LAB;  Service: Cardiovascular;  Laterality: N/A;   TAVAR  04/2019   TEE WITHOUT CARDIOVERSION N/A 01/26/2013   Procedure: TRANSESOPHAGEAL ECHOCARDIOGRAM (TEE);  Surgeon: Larey Dresser, MD;  Location: [place];  Service: Cardiovascular;  Laterality: N/A;   TEE WITHOUT CARDIOVERSION N/A 10/28/2017   Procedure: TRANSESOPHAGEAL ECHOCARDIOGRAM (TEE);  Surgeon: Larey Dresser, MD;  [facility] ENDOSCOPY;  Service: Cardiovascular;  Laterality: N/A;   TEE WITHOUT CARDIOVERSION N/A 05/08/2019   Procedure: TRANSESOPHAGEAL ECHOCARDIOGRAM (TEE);  Surgeon: Burnell Blanks, MD;  Location: [place] CV LAB;  Service: Open Heart Surgery;  Laterality: N/A;   TEE WITHOUT CARDIOVERSION N/A 09/19/2019   Procedure: TRANSESOPHAGEAL ECHOCARDIOGRAM (TEE);  Surgeon: Larey Dresser, MD;  Location: [facility] ENDOSCOPY;  Service: Cardiovascular;  Laterality: N/A;   TRANSCATHETER AORTIC VALVE REPLACEMENT, TRANSFEMORAL N/A 05/08/2019   Procedure: TRANSCATHETER AORTIC VALVE REPLACEMENT, TRANSFEMORAL;  Surgeon: Burnell Blanks, MD;   Location: [place] CV LAB;  Service: Open Heart Surgery;  Laterality: N/A;    SOCIAL HISTORY: Social History   Socioeconomic History   Marital status: Married    Spouse name: Not on file   Number of children: 0   Years of education: Not on file   Highest education level: Not on file  Occupational History   Occupation: Retired Lobbyist: [employer]  Tobacco Use   Smoking status: Never   Smokeless tobacco: Never  Vaping Use   Vaping Use: Never used  Substance and Sexual Activity   Alcohol use: Not Currently   Drug use: No   Sexual activity: Yes  Other Topics Concern   Not on file  Social History Narrative   Patient lives in [place] w/ his wife. He is a native of [place]. 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   Right-handed   Caffeine: 2 cups coffee per day   Two story home   Social Determinants of Health   Financial Resource Strain: Not on file  Food Insecurity: Not on file  Transportation Needs: Not on file  Physical Activity: Not on file  Stress: Not on file  Social Connections: Not on file  °Intimate Partner Violence: Not on file  ° ° °FAMILY HISTORY: °Family History  °Problem Relation Age of Onset  ° Colon cancer Mother 80  ° Hypertension Other   ° Coronary artery disease Neg Hx   ° Colon polyps Neg Hx   ° Esophageal cancer Neg Hx   ° Rectal cancer Neg Hx   ° Stomach cancer Neg Hx   ° ° °ALLERGIES:  is allergic to xarelto [rivaroxaban], corticosteroids, ramipril, zolpidem, and benazepril. ° °MEDICATIONS:  °Current Outpatient Medications  °Medication Sig Dispense Refill  ° b complex vitamins tablet Take 1 tablet by mouth daily. 100 tablet 3  ° Calcium-Magnesium 500-250 MG TABS Take 1 tablet by mouth daily.    ° Carboxymethylcellul-Glycerin (LUBRICATING EYE DROPS OP) Place 1 drop into both eyes daily as needed (dry eyes).    ° Cholecalciferol (VITAMIN D) 50 MCG (2000 UT) tablet Take 2,000 Units by mouth daily.     ° diclofenac Sodium (VOLTAREN) 1 % GEL Apply 1 application topically 2 (two) times daily as needed (pain.).    ° ELIQUIS 5 MG TABS tablet TAKE 1 TABLET BY MOUTH TWICE A DAY 60 tablet 11  ° gabapentin (NEURONTIN) 300 MG capsule Take 300 mg by mouth at bedtime.    ° ipratropium (ATROVENT) 0.06 % nasal spray Place 2 sprays into the nose 3 (three) times daily. 15 mL 2  ° lenalidomide (REVLIMID) 10 MG capsule Take 1 capsule (10 mg total) by mouth daily. 21 capsule 1  ° metoprolol succinate (TOPROL-XL) 25 MG 24 hr tablet TAKE 1 TABLET BY MOUTH TWICE A DAY 180 tablet 3  ° metroNIDAZOLE (METROCREAM) 0.75 % cream Apply 1 application topically 2 (two) times daily.    ° oxyCODONE (OXY IR/ROXICODONE) 5 MG immediate release tablet Take 1-2 tablets (5-10 mg total) by mouth 2 (two) times daily as needed for severe pain. 60 tablet 0  ° polyethylene glycol powder (GLYCOLAX/MIRALAX) 17 GM/SCOOP powder Take 17-34 g by mouth 2 (two) times daily as needed for moderate constipation. 500 g 5  ° senna-docusate (SENOKOT-S) 8.6-50 MG tablet Take 2 tablets by mouth at bedtime.    ° spironolactone (ALDACTONE) 25 MG tablet TAKE 1 TABLET BY MOUTH EVERY DAY 90 tablet 3  ° Vitamin D, Ergocalciferol, (DRISDOL) 1.25 MG (50000 UNIT) CAPS capsule TAKE 1 CAPSULE BY MOUTH ONE TIME PER WEEK 12 capsule 3  ° °No current facility-administered medications for this visit.  ° ° °REVIEW OF SYSTEMS:   °10 Point review of Systems was done is negative except as noted above. ° ° °PHYSICAL EXAMINATION: °There were no vitals filed for this visit. ° °Wt Readings from Last 3 Encounters:  °11/12/21 184 lb 6.4 oz (83.6 kg)  °11/03/21 183 lb 3.2 oz (83.1 kg)  °10/29/21 183 lb 3.2 oz (83.1 kg)  ° °There is no height or weight on file to calculate BMI.   ° °ECOG FS:2 - Symptomatic, <50% confined to bed °NAD °GENERAL:alert, in no acute distress and comfortable °SKIN: no acute rashes, no significant lesions °EYES: conjunctiva are pink and non-injected, sclera  anicteric °OROPHARYNX: MMM, no exudates, no oropharyngeal erythema or ulceration °NECK: supple, no JVD °LYMPH:  no palpable lymphadenopathy in the cervical, axillary or inguinal regions °LUNGS: clear to auscultation b/l with normal respiratory effort °HEART: regular rate & rhythm °ABDOMEN:  normoactive bowel sounds , non tender, not distended. °Extremity: no pedal edema °PSYCH: alert & oriented x 3 with fluent speech °NEURO: no focal motor/sensory deficits ° ° ° °  LABORATORY DATA:  °I have reviewed the data as listed ° °. °CBC Latest Ref Rng & Units 11/03/2021 10/15/2021 09/30/2021  °WBC 4.0 - 10.5 K/uL 6.0 6.3 6.2  °Hemoglobin 13.0 - 17.0 g/dL 15.2 14.6 14.8  °Hematocrit 39.0 - 52.0 % 47.6 45.0 45.4  °Platelets 150 - 400 K/uL 144(L) 146(L) 157  ° ° °. °CMP Latest Ref Rng & Units 10/15/2021 09/30/2021 07/29/2021  °Glucose 70 - 99 mg/dL 94 100(H) 99  °BUN 8 - 23 mg/dL 24(H) 20 20  °Creatinine 0.61 - 1.24 mg/dL 1.30(H) 1.11 1.04  °Sodium 135 - 145 mmol/L 141 142 141  °Potassium 3.5 - 5.1 mmol/L 4.1 3.9 3.8  °Chloride 98 - 111 mmol/L 106 107 107  °CO2 22 - 32 mmol/L 30 29 27  °Calcium 8.9 - 10.3 mg/dL 9.4 9.5 9.0  °Total Protein 6.5 - 8.1 g/dL 6.7 6.6 6.3(L)  °Total Bilirubin 0.3 - 1.2 mg/dL 0.6 0.5 0.6  °Alkaline Phos 38 - 126 U/L 55 53 57  °AST 15 - 41 U/L 22 23 23  °ALT 0 - 44 U/L 20 20 20  ° ° ° ° ° ° ° °05/24/2019 Bone Marrow Biopsy  ° ° °04/16/2019 Surgical Pathology: ° ° °Surgical Pathology  °CASE: [id number]  °PATIENT: Bengie Hinkle  °Bone Marrow Report  ° ° ° ° °Clinical History: Multiple myeloma, remission status unspecified (HCC)  °(BH)  ° ° ° ° °DIAGNOSIS:  ° °BONE MARROW, ASPIRATE, CLOT, CORE:  °-Variably cellular bone marrow with trilineage hematopoiesis and 2%  °plasma cells  °-See comment  ° °PERIPHERAL BLOOD:  °-Slight thrombocytopenia  ° °COMMENT:  ° °The bone marrow is variably cellular with trilineage hematopoiesis  °including abundant megakaryocytes in the more cellular areas.  In this  °background, the  plasma cells represent 2% of all cells in the aspirate  °with interstitial cells and a few very minute clusters in the  °clot/biopsy sections.  The latter in particular display kappa light  °chain excess/restriction most suggestive of minimal residual plasma cell  °neoplasm despite limited findings.  Correlation with cytogenetic and  °FISH studies is recommended.  ° ° °RADIOGRAPHIC STUDIES: °I have personally reviewed the radiological images as listed and agreed with the findings in the report. °CT Biopsy ° °Result Date: 11/03/2021 °INDICATION: Multiple myeloma EXAM: CT GUIDED BONE MARROW ASPIRATION AND CORE BIOPSY MEDICATIONS: None. ANESTHESIA/SEDATION: Moderate (conscious) sedation was employed during this procedure. A total of 2 milligrams versed and 100 micrograms fentanyl were administered intravenously. The patient's level of consciousness and vital signs were monitored continuously by radiology nursing throughout the procedure under my direct supervision. Total monitored sedation time: 13 minutes FLUOROSCOPY TIME:  CT dose in mGy was not provided. COMPLICATIONS: None immediate. Estimated blood loss: <5 mL PROCEDURE: Informed written consent was obtained from the patient after a thorough discussion of the procedural risks, benefits and alternatives. All questions were addressed. Maximal Sterile Barrier Technique was utilized including caps, mask, sterile gowns, sterile gloves, sterile drape, hand hygiene and skin antiseptic. A timeout was performed prior to the initiation of the procedure. The patient was positioned prone and non-contrast localization CT was performed of the pelvis to demonstrate the iliac marrow spaces. Maximal barrier sterile technique utilized including caps, mask, sterile gowns, sterile gloves, large sterile drape, hand hygiene, and chlorhexidine prep. Under sterile conditions and local anesthesia, an 11 gauge coaxial bone biopsy needle was advanced into the RIGHT iliac marrow space. Needle  position was confirmed with CT imaging. Initially, bone marrow aspiration was   performed. Next, the 11 gauge outer cannula was utilized to obtain a 1 iliac bone marrow core biopsy. Needle was removed. Hemostasis was obtained with compression. The patient tolerated the procedure well. Samples were prepared with the cytotechnologist. IMPRESSION: Successful CT-guided bone marrow aspiration and biopsy, as above. Jon Mugweru, MD [facility] Electronically Signed   By: Jon  Mugweru M.D.   On: 11/03/2021 19:56  ° °CT BONE MARROW BIOPSY & ASPIRATION ° °Result Date: 11/03/2021 °INDICATION: Multiple myeloma EXAM: CT GUIDED BONE MARROW ASPIRATION AND CORE BIOPSY MEDICATIONS: None. ANESTHESIA/SEDATION: Moderate (conscious) sedation was employed during this procedure. A total of 2 milligrams versed and 100 micrograms fentanyl were administered intravenously. The patient's level of consciousness and vital signs were monitored continuously by radiology nursing throughout the procedure under my direct supervision. Total monitored sedation time: 13 minutes FLUOROSCOPY TIME:  CT dose in mGy was not provided. COMPLICATIONS: None immediate. Estimated blood loss: <5 mL PROCEDURE: Informed written consent was obtained from the patient after a thorough discussion of the procedural risks, benefits and alternatives. All questions were addressed. Maximal Sterile Barrier Technique was utilized including caps, mask, sterile gowns, sterile gloves, sterile drape, hand hygiene and skin antiseptic. A timeout was performed prior to the initiation of the procedure. The patient was positioned prone and non-contrast localization CT was performed of the pelvis to demonstrate the iliac marrow spaces. Maximal barrier sterile technique utilized including caps, mask, sterile gowns, sterile gloves, large sterile drape, hand hygiene, and chlorhexidine prep. Under sterile conditions and local  anesthesia, an 11 gauge coaxial bone biopsy needle was advanced into the RIGHT iliac marrow space. Needle position was confirmed with CT imaging. Initially, bone marrow aspiration was performed. Next, the 11 gauge outer cannula was utilized to obtain a 1 iliac bone marrow core biopsy. Needle was removed. Hemostasis was obtained with compression. The patient tolerated the procedure well. Samples were prepared with the cytotechnologist. IMPRESSION: Successful CT-guided bone marrow aspiration and biopsy, as above. Jon Mugweru, MD [facility] Electronically Signed   By: Jon  Mugweru M.D.   On: 11/03/2021 19:56   ° °ASSESSMENT & PLAN:  ° °81 yo  ° °#1 Plasma cell myeloma-currently in remission. ° °03/28/2019 MRI pelvis w/wo contrast revealed "1. Destructive bone lesions as detailed above. Findings most consistent with metastatic disease. PET-CT may be helpful for further evaluation and to establish a primary tumor. The right pelvic bone lesions should be amenable to image guided biopsy but a PET scan may demonstrate easier/safer biopsy sites. 2. No intrapelvic mass or adenopathy. 3. Benign intraosseous lipoma involving the left anterior superior acetabulum." ° °04/06/2019 PET whole body revealed "1. Diffuse osseous metastatic disease as detailed above without findings for a primary neoplasm in the chest, abdomen or pelvis. The large destructive lesion involving the right ischium should be amenable to image guided biopsy. 2. Two small retroperitoneal lymph nodes and 1 small right obturator node showing hypermetabolism."  ° °04/16/2019 Posterior right pelvis bone biopsy revealed “PLASMA CELL NEOPLASM” ° °05/24/2019 Bone Marrow Biopsy revealed "BONE MARROW: - CELLULAR MARROW WITH INVOLVEMENT BY PLASMA CELL NEOPLASM (20%) PERIPHERAL BLOOD: - MORPHOLOGICALLY UNREMARKABLE" ° °05/24/2019 FISH Panel revealed "ABNORMAL result with 11q+, 14q+ and +17" ° °#2 Severe aortic  stenosis with bicuspid aortic valve °-10/26/2018 ECHO revealed AVA at 0.8 cm2 and LV EF of 60-65% °-05/08/2019 pt had a Transfemoral Transcatheter Aortic Valve Replacement ° ° °PLAN: °-Recently on last visit discussed PET CT scan from 2/2   which shows There is persistent low level radiotracer uptake associated with the large lucent lesion within the superior right acetabulum. Additionally, there is a new focal area of increased radiotracer uptake above the °background low level activity within this lesion with SUV max of 3.92. On the previous exam there was relatively homogeneous low level uptake within this lesion within SUV max of 2.43. Imaging findings are concerning for residual metabolically active tumor. °Patient has seen radiation oncology since his last visit and they have decided to proceed with involved site radiation therapy to the FDG avid lesion to reduce the risk of fracture. ° °-He had his bone marrow biopsy on 11/03/2021 which shows 2% abnormal plasma cells. ° °-Given his low burden of disease in addition to radiation to his FDG avid bone lesion we will plan to proceed with maintenance Revlimid 10 mg 3 weeks on 1 week off. °He has received this medication and will start this shortly. ° °He is already on Eliquis which will serve for VTE prophylaxis in addition to treatment of his atrial fibrillation. °-Continue Gabapentin 600 mg p.o. at bedtime.  °-Continue Oxycodone prn for neuropathy pain °-Continue Zometa q3months. ° °FOLLOW UP: °Follow-up with radiation oncology °Return to clinic with Dr.  with labs and 6 weeks. ° ° °  MD MS °Hematology/Oncology Physician °[facility] °  ° °  ° .. °

## 2021-11-20 NOTE — Progress Notes (Deleted)
Patient Hgb 8.9. Informed patient and daughter of Hgb and based on the notes he would not need a transfusion today unless he was symptomatic. Patient stated that he has felt bad, no energy, SOB for months but no recent changes but they were still concerned. Spoke with [name] regarding above information and received verbal order that patient does not need a blood transfusion. Followed up with patient and daughter, [name] [name], and both verbalized understanding and are aware of upcoming appointment with [name]. ?

## 2021-11-20 NOTE — Progress Notes (Signed)
This encounter was created in error - please disregard.

## 2021-11-20 NOTE — Telephone Encounter (Signed)
Oral Oncology Patient Advocate Encounter ? ?Met patient in lobby to complete application for Aspers in an effort to reduce patient's out of pocket expense for Revlimid to $0.   ? ?Application completed and faxed to [phone]  ? ?[facility] Access Support phone number for follow up is [phone]. ? ?This encounter will be updated until final determination. ? ?Wynn Maudlin CPHT ?Specialty Pharmacy Patient Advocate ?[place] ?Phone [phone] ?Fax [phone] ?11/20/2021 10:27 AM ? ? ?

## 2021-11-23 ENCOUNTER — Telehealth: Payer: Self-pay | Admitting: Hematology

## 2021-11-23 LAB — MULTIPLE MYELOMA PANEL, SERUM
Albumin SerPl Elph-Mcnc: 4.2 g/dL (ref 2.9–4.4)
Albumin/Glob SerPl: 1.9 — ABNORMAL HIGH (ref 0.7–1.7)
Alpha 1: 0.2 g/dL (ref 0.0–0.4)
Alpha2 Glob SerPl Elph-Mcnc: 0.6 g/dL (ref 0.4–1.0)
B-Globulin SerPl Elph-Mcnc: 0.9 g/dL (ref 0.7–1.3)
Gamma Glob SerPl Elph-Mcnc: 0.6 g/dL (ref 0.4–1.8)
Globulin, Total: 2.3 g/dL (ref 2.2–3.9)
IgA: 71 mg/dL (ref 61–437)
IgG (Immunoglobin G), Serum: 590 mg/dL — ABNORMAL LOW (ref 603–1613)
IgM (Immunoglobulin M), Srm: 62 mg/dL (ref 15–143)
M Protein SerPl Elph-Mcnc: 0.3 g/dL — ABNORMAL HIGH
Total Protein ELP: 6.5 g/dL (ref 6.0–8.5)

## 2021-11-23 LAB — KAPPA/LAMBDA LIGHT CHAINS
Kappa free light chain: 29.2 mg/L — ABNORMAL HIGH (ref 3.3–19.4)
Kappa, lambda light chain ratio: 4.71 — ABNORMAL HIGH (ref 0.26–1.65)
Lambda free light chains: 6.2 mg/L (ref 5.7–26.3)

## 2021-11-23 NOTE — Telephone Encounter (Signed)
.  Called patient to schedule appointment per 3/10 inbasket, patient is aware of date and time.   ?

## 2021-11-26 DIAGNOSIS — C7951 Secondary malignant neoplasm of bone: Secondary | ICD-10-CM | POA: Diagnosis not present

## 2021-11-26 DIAGNOSIS — Z510 Encounter for antineoplastic radiation therapy: Secondary | ICD-10-CM | POA: Diagnosis not present

## 2021-11-26 DIAGNOSIS — C9000 Multiple myeloma not having achieved remission: Secondary | ICD-10-CM | POA: Diagnosis not present

## 2021-11-30 ENCOUNTER — Other Ambulatory Visit: Payer: Self-pay

## 2021-11-30 ENCOUNTER — Ambulatory Visit
Admission: RE | Admit: 2021-11-30 | Discharge: 2021-11-30 | Disposition: A | Discharge status: Home / Self Care | Payer: Medicare Other | Source: Ambulatory Visit | Attending: Radiation Oncology | Admitting: Radiation Oncology

## 2021-11-30 DIAGNOSIS — Z510 Encounter for antineoplastic radiation therapy: Secondary | ICD-10-CM | POA: Diagnosis not present

## 2021-11-30 DIAGNOSIS — C9000 Multiple myeloma not having achieved remission: Secondary | ICD-10-CM | POA: Diagnosis not present

## 2021-11-30 DIAGNOSIS — C7951 Secondary malignant neoplasm of bone: Secondary | ICD-10-CM | POA: Diagnosis not present

## 2021-12-01 ENCOUNTER — Ambulatory Visit
Admission: RE | Admit: 2021-12-01 | Discharge: 2021-12-01 | Disposition: A | Discharge status: Home / Self Care | Payer: Medicare Other | Source: Ambulatory Visit | Attending: Radiation Oncology | Admitting: Radiation Oncology

## 2021-12-01 DIAGNOSIS — C9000 Multiple myeloma not having achieved remission: Secondary | ICD-10-CM | POA: Diagnosis not present

## 2021-12-01 DIAGNOSIS — C7951 Secondary malignant neoplasm of bone: Secondary | ICD-10-CM | POA: Diagnosis not present

## 2021-12-01 DIAGNOSIS — Z510 Encounter for antineoplastic radiation therapy: Secondary | ICD-10-CM | POA: Diagnosis not present

## 2021-12-02 ENCOUNTER — Telehealth: Payer: Self-pay | Admitting: Pharmacist

## 2021-12-02 ENCOUNTER — Ambulatory Visit
Admission: RE | Admit: 2021-12-02 | Discharge: 2021-12-02 | Disposition: A | Discharge status: Home / Self Care | Payer: Medicare Other | Source: Ambulatory Visit | Attending: Radiation Oncology | Admitting: Radiation Oncology

## 2021-12-02 ENCOUNTER — Other Ambulatory Visit: Payer: Self-pay

## 2021-12-02 DIAGNOSIS — C9000 Multiple myeloma not having achieved remission: Secondary | ICD-10-CM | POA: Diagnosis not present

## 2021-12-02 DIAGNOSIS — Z510 Encounter for antineoplastic radiation therapy: Secondary | ICD-10-CM | POA: Diagnosis not present

## 2021-12-02 DIAGNOSIS — C7951 Secondary malignant neoplasm of bone: Secondary | ICD-10-CM | POA: Diagnosis not present

## 2021-12-02 MED ORDER — LENALIDOMIDE 5 MG PO CAPS: 5.0000 mg | ORAL_CAPSULE | Freq: Every day | ORAL | 0 refills | Status: DC

## 2021-12-02 NOTE — Telephone Encounter (Signed)
Oral Oncology Pharmacist Encounter ? ?Patient now approved to receive Revlimid (lenalidomdie) at no cost through [facility] Patient Assistance Program.  ? ?Prescription for Revlimid has been redirected from [facility] to [facility] [employer] ([facility] location) for dispensing.  ? ?Leron Croak, PharmD, BCPS ?Hematology/Oncology Clinical Pharmacist ?[facility] ?[phone] ?12/02/2021 2:02 PM ? ? ? ?

## 2021-12-02 NOTE — Telephone Encounter (Signed)
Patient is approved for Revlimid at  no cost from [facility] 12/02/21-09/12/22 ? ?[facility] uses [facility] by [employer] ? ?Wynn Maudlin CPHT ?Specialty Pharmacy Patient Advocate ?[place] ?Phone [phone] ?Fax [phone] ?12/02/2021 2:02 PM  ?

## 2021-12-03 ENCOUNTER — Ambulatory Visit
Admission: RE | Admit: 2021-12-03 | Discharge: 2021-12-03 | Disposition: A | Discharge status: Home / Self Care | Payer: Medicare Other | Source: Ambulatory Visit | Attending: Radiation Oncology | Admitting: Radiation Oncology

## 2021-12-03 DIAGNOSIS — C9000 Multiple myeloma not having achieved remission: Secondary | ICD-10-CM | POA: Diagnosis not present

## 2021-12-03 DIAGNOSIS — Z510 Encounter for antineoplastic radiation therapy: Secondary | ICD-10-CM | POA: Diagnosis not present

## 2021-12-03 DIAGNOSIS — C7951 Secondary malignant neoplasm of bone: Secondary | ICD-10-CM | POA: Diagnosis not present

## 2021-12-04 ENCOUNTER — Ambulatory Visit
Admission: RE | Admit: 2021-12-04 | Discharge: 2021-12-04 | Disposition: A | Discharge status: Home / Self Care | Payer: Medicare Other | Source: Ambulatory Visit | Attending: Radiation Oncology | Admitting: Radiation Oncology

## 2021-12-04 DIAGNOSIS — Z510 Encounter for antineoplastic radiation therapy: Secondary | ICD-10-CM | POA: Diagnosis not present

## 2021-12-04 DIAGNOSIS — C9000 Multiple myeloma not having achieved remission: Secondary | ICD-10-CM | POA: Diagnosis not present

## 2021-12-04 DIAGNOSIS — C7951 Secondary malignant neoplasm of bone: Secondary | ICD-10-CM | POA: Diagnosis not present

## 2021-12-07 ENCOUNTER — Other Ambulatory Visit: Payer: Self-pay

## 2021-12-07 ENCOUNTER — Ambulatory Visit
Admission: RE | Admit: 2021-12-07 | Discharge: 2021-12-07 | Disposition: A | Discharge status: Home / Self Care | Payer: Medicare Other | Source: Ambulatory Visit | Attending: Radiation Oncology | Admitting: Radiation Oncology

## 2021-12-07 DIAGNOSIS — C9000 Multiple myeloma not having achieved remission: Secondary | ICD-10-CM | POA: Diagnosis not present

## 2021-12-07 DIAGNOSIS — Z510 Encounter for antineoplastic radiation therapy: Secondary | ICD-10-CM | POA: Diagnosis not present

## 2021-12-07 DIAGNOSIS — C7951 Secondary malignant neoplasm of bone: Secondary | ICD-10-CM | POA: Diagnosis not present

## 2021-12-07 NOTE — Telephone Encounter (Signed)
Oral Chemotherapy Pharmacist Encounter ? ?I spoke with patient for overview of: Revlimid for the maintenance treatment of multiple myeloma, planned duration until disease progression or unacceptable drug toxicity. ? ?Counseled patient on administration, dosing, side effects, monitoring, drug-food interactions, safe handling, storage, and disposal. ? ?Patient will take Revlimid 5mg capsules, 1 capsule by mouth once daily, without regard to food, with a full glass of water. ? ?Revlimid will be given 21 days on, 7 days off, repeat every 28 days. ? ?Revlimid start date: 12/14/21 ? ?Adverse effects of Revlimid include but are not limited to: nausea, constipation, diarrhea, abdominal pain, rash, fatigue, drug fever, and decreased blood counts.   ? ?Reviewed with patient importance of keeping a medication schedule and plan for any missed doses. No barriers to medication adherence identified. ? ?Medication reconciliation performed and medication/allergy list updated. ? ?Patient on Eliquis 5 mg tabs BID - appropriate coverage for VTE prophylaxis. ? ?Insurance authorization for Revlimid has been obtained. Due to high copay patient will be receiving medication through [facility] Patient Assistance Program at no cost. ? ?Revlimid prescription is being dispensed from [facility] by [facility] specialty pharmacy as it is a limited distribution medication. ? ?All questions answered. ? ?Mr. [name] [name] voiced understanding and appreciation.  ? ?Medication education handout placed in mail for patient. Patient knows to call the office with questions or concerns. Oral Chemotherapy Clinic phone number provided to patient.  ? ? [place], PharmD, BCPS ?Hematology/Oncology Clinical Pharmacist ?[facility] ?[phone] ?12/07/2021 2:49 PM ? ?

## 2021-12-08 ENCOUNTER — Ambulatory Visit
Admission: RE | Admit: 2021-12-08 | Discharge: 2021-12-08 | Disposition: A | Discharge status: Home / Self Care | Payer: Medicare Other | Source: Ambulatory Visit | Attending: Radiation Oncology | Admitting: Radiation Oncology

## 2021-12-08 DIAGNOSIS — C9000 Multiple myeloma not having achieved remission: Secondary | ICD-10-CM | POA: Diagnosis not present

## 2021-12-08 DIAGNOSIS — C7951 Secondary malignant neoplasm of bone: Secondary | ICD-10-CM | POA: Diagnosis not present

## 2021-12-08 DIAGNOSIS — Z510 Encounter for antineoplastic radiation therapy: Secondary | ICD-10-CM | POA: Diagnosis not present

## 2021-12-09 ENCOUNTER — Ambulatory Visit
Admission: RE | Admit: 2021-12-09 | Discharge: 2021-12-09 | Disposition: A | Discharge status: Home / Self Care | Payer: Medicare Other | Source: Ambulatory Visit | Attending: Radiation Oncology | Admitting: Radiation Oncology

## 2021-12-09 ENCOUNTER — Other Ambulatory Visit: Payer: Self-pay

## 2021-12-09 DIAGNOSIS — Z510 Encounter for antineoplastic radiation therapy: Secondary | ICD-10-CM | POA: Diagnosis not present

## 2021-12-09 DIAGNOSIS — C7951 Secondary malignant neoplasm of bone: Secondary | ICD-10-CM | POA: Diagnosis not present

## 2021-12-09 DIAGNOSIS — C9000 Multiple myeloma not having achieved remission: Secondary | ICD-10-CM | POA: Diagnosis not present

## 2021-12-10 ENCOUNTER — Telehealth: Payer: Self-pay | Admitting: Internal Medicine

## 2021-12-10 ENCOUNTER — Ambulatory Visit
Admission: RE | Admit: 2021-12-10 | Discharge: 2021-12-10 | Disposition: A | Discharge status: Home / Self Care | Payer: Medicare Other | Source: Ambulatory Visit | Attending: Radiation Oncology | Admitting: Radiation Oncology

## 2021-12-10 DIAGNOSIS — Z510 Encounter for antineoplastic radiation therapy: Secondary | ICD-10-CM | POA: Diagnosis not present

## 2021-12-10 DIAGNOSIS — C7951 Secondary malignant neoplasm of bone: Secondary | ICD-10-CM | POA: Diagnosis not present

## 2021-12-10 DIAGNOSIS — C9000 Multiple myeloma not having achieved remission: Secondary | ICD-10-CM | POA: Diagnosis not present

## 2021-12-10 NOTE — Telephone Encounter (Signed)
N/A unable to leave a message for patient to call back to schedule Medicare Annual Wellness Visit  ? ?Last AWV  03/04/15 ? ?Please schedule at anytime with [facility] if patient calls the office back.   ? ?40 Minutes appointment  ? ?Any questions, please call me at [phone]  ?

## 2021-12-11 ENCOUNTER — Encounter: Payer: Self-pay | Admitting: Radiation Oncology

## 2021-12-11 ENCOUNTER — Other Ambulatory Visit: Payer: Self-pay

## 2021-12-11 ENCOUNTER — Ambulatory Visit
Admission: RE | Admit: 2021-12-11 | Discharge: 2021-12-11 | Disposition: A | Discharge status: Home / Self Care | Payer: Medicare Other | Source: Ambulatory Visit | Attending: Radiation Oncology | Admitting: Radiation Oncology

## 2021-12-11 DIAGNOSIS — C9000 Multiple myeloma not having achieved remission: Secondary | ICD-10-CM | POA: Diagnosis not present

## 2021-12-11 DIAGNOSIS — Z510 Encounter for antineoplastic radiation therapy: Secondary | ICD-10-CM | POA: Diagnosis not present

## 2021-12-11 DIAGNOSIS — C7951 Secondary malignant neoplasm of bone: Secondary | ICD-10-CM | POA: Diagnosis not present

## 2021-12-13 ENCOUNTER — Encounter: Payer: Self-pay | Admitting: Hematology

## 2021-12-22 ENCOUNTER — Encounter: Payer: Self-pay | Admitting: Hematology

## 2021-12-22 NOTE — Progress Notes (Signed)
? ?                                                                                                                                                          ?  Patient Name: [name] ?MRN: [id number] ?DOB: 1941/07/10 ?Referring Physician: Sullivan Lone (Profile Not Attached) ?Date of Service: 12/11/2021 ?[name] [place] ? ?                                                      End Of Treatment Note ? ?Diagnoses: C79.51-Secondary malignant neoplasm of bone ? ?Cancer Staging: Multiple Myeloma with radiographic evidence of recurrence in the right acetabulum ? ?Intent: Palliative ? ?Radiation Treatment Dates: 11/30/2021 through 12/11/2021 ?Site Technique Total Dose (Gy) Dose per Fx (Gy) Completed Fx Beam Energies  ?Ilium, Right: Pelvis_R 3D 25/25 2.5 10/10 15X  ? ?Narrative: The patient tolerated radiation therapy relatively well. He did still have some pain at the conclusion of radiation but did not require refills of pain medication. ? ?Plan: The patient will receive a call in about one month from the radiation oncology department. He will continue follow up with Dr. Irene Limbo as well.  ? ?________________________________________________ ? ? ? ?Carola Rhine, PAC  ?

## 2021-12-25 ENCOUNTER — Other Ambulatory Visit: Payer: Self-pay

## 2021-12-25 DIAGNOSIS — C9000 Multiple myeloma not having achieved remission: Secondary | ICD-10-CM

## 2021-12-25 MED ORDER — LENALIDOMIDE 5 MG PO CAPS: 5.0000 mg | ORAL_CAPSULE | Freq: Every day | ORAL | 0 refills | Status: DC

## 2021-12-27 ENCOUNTER — Other Ambulatory Visit (HOSPITAL_COMMUNITY): Payer: Self-pay | Admitting: Cardiology

## 2021-12-31 ENCOUNTER — Other Ambulatory Visit: Payer: Self-pay

## 2021-12-31 DIAGNOSIS — C9000 Multiple myeloma not having achieved remission: Secondary | ICD-10-CM

## 2021-12-31 MED ORDER — OXYCODONE HCL 5 MG PO TABS: 5.0000 mg | ORAL_TABLET | Freq: Two times a day (BID) | ORAL | 0 refills | Status: DC | PRN

## 2022-01-06 ENCOUNTER — Inpatient Hospital Stay (HOSPITAL_BASED_OUTPATIENT_CLINIC_OR_DEPARTMENT_OTHER): Payer: Medicare Other | Admitting: Hematology

## 2022-01-06 ENCOUNTER — Inpatient Hospital Stay: Payer: Medicare Other | Attending: Hematology

## 2022-01-06 ENCOUNTER — Other Ambulatory Visit: Payer: Self-pay

## 2022-01-06 VITALS — BP 143/78 | HR 56 | Temp 97.9°F | Resp 20 | Wt 183.1 lb

## 2022-01-06 DIAGNOSIS — Z800 Family history of malignant neoplasm of digestive organs: Secondary | ICD-10-CM | POA: Diagnosis not present

## 2022-01-06 DIAGNOSIS — D696 Thrombocytopenia, unspecified: Secondary | ICD-10-CM | POA: Diagnosis not present

## 2022-01-06 DIAGNOSIS — I509 Heart failure, unspecified: Secondary | ICD-10-CM | POA: Insufficient documentation

## 2022-01-06 DIAGNOSIS — I110 Hypertensive heart disease with heart failure: Secondary | ICD-10-CM | POA: Diagnosis not present

## 2022-01-06 DIAGNOSIS — C9000 Multiple myeloma not having achieved remission: Secondary | ICD-10-CM

## 2022-01-06 DIAGNOSIS — C9002 Multiple myeloma in relapse: Secondary | ICD-10-CM | POA: Insufficient documentation

## 2022-01-06 LAB — CBC WITH DIFFERENTIAL (CANCER CENTER ONLY)
Abs Immature Granulocytes: 0.03 10*3/uL (ref 0.00–0.07)
Basophils Absolute: 0.1 10*3/uL (ref 0.0–0.1)
Basophils Relative: 2 %
Eosinophils Absolute: 0.7 10*3/uL — ABNORMAL HIGH (ref 0.0–0.5)
Eosinophils Relative: 15 %
HCT: 40.8 % (ref 39.0–52.0)
Hemoglobin: 13.6 g/dL (ref 13.0–17.0)
Immature Granulocytes: 1 %
Lymphocytes Relative: 23 %
Lymphs Abs: 1 10*3/uL (ref 0.7–4.0)
MCH: 30.6 pg (ref 26.0–34.0)
MCHC: 33.3 g/dL (ref 30.0–36.0)
MCV: 91.7 fL (ref 80.0–100.0)
Monocytes Absolute: 0.6 10*3/uL (ref 0.1–1.0)
Monocytes Relative: 13 %
Neutro Abs: 2.2 10*3/uL (ref 1.7–7.7)
Neutrophils Relative %: 46 %
Platelet Count: 125 10*3/uL — ABNORMAL LOW (ref 150–400)
RBC: 4.45 MIL/uL (ref 4.22–5.81)
RDW: 13.2 % (ref 11.5–15.5)
WBC Count: 4.6 10*3/uL (ref 4.0–10.5)
nRBC: 0 % (ref 0.0–0.2)

## 2022-01-06 LAB — CMP (CANCER CENTER ONLY)
ALT: 26 U/L (ref 0–44)
AST: 29 U/L (ref 15–41)
Albumin: 4.2 g/dL (ref 3.5–5.0)
Alkaline Phosphatase: 55 U/L (ref 38–126)
Anion gap: 7 (ref 5–15)
BUN: 21 mg/dL (ref 8–23)
CO2: 25 mmol/L (ref 22–32)
Calcium: 9.2 mg/dL (ref 8.9–10.3)
Chloride: 108 mmol/L (ref 98–111)
Creatinine: 1.13 mg/dL (ref 0.61–1.24)
GFR, Estimated: >60 mL/min (ref >60)
Glucose, Bld: 92 mg/dL (ref 70–99)
Potassium: 4.1 mmol/L (ref 3.5–5.1)
Sodium: 140 mmol/L (ref 135–145)
Total Bilirubin: 0.6 mg/dL (ref 0.3–1.2)
Total Protein: 6.2 g/dL — ABNORMAL LOW (ref 6.5–8.1)

## 2022-01-06 MED ORDER — AMOXICILLIN-POT CLAVULANATE 875-125 MG PO TABS: 1.0000 | ORAL_TABLET | Freq: Two times a day (BID) | ORAL | 0 refills | Status: AC

## 2022-01-07 LAB — KAPPA/LAMBDA LIGHT CHAINS
Kappa free light chain: 34.9 mg/L — ABNORMAL HIGH (ref 3.3–19.4)
Kappa, lambda light chain ratio: 3.39 — ABNORMAL HIGH (ref 0.26–1.65)
Lambda free light chains: 10.3 mg/L (ref 5.7–26.3)

## 2022-01-08 LAB — MULTIPLE MYELOMA PANEL, SERUM
Albumin SerPl Elph-Mcnc: 3.8 g/dL (ref 2.9–4.4)
Albumin/Glob SerPl: 1.9 — ABNORMAL HIGH (ref 0.7–1.7)
Alpha 1: 0.2 g/dL (ref 0.0–0.4)
Alpha2 Glob SerPl Elph-Mcnc: 0.5 g/dL (ref 0.4–1.0)
B-Globulin SerPl Elph-Mcnc: 0.7 g/dL (ref 0.7–1.3)
Gamma Glob SerPl Elph-Mcnc: 0.6 g/dL (ref 0.4–1.8)
Globulin, Total: 2.1 g/dL — ABNORMAL LOW (ref 2.2–3.9)
IgA: 81 mg/dL (ref 61–437)
IgG (Immunoglobin G), Serum: 583 mg/dL — ABNORMAL LOW (ref 603–1613)
IgM (Immunoglobulin M), Srm: 54 mg/dL (ref 15–143)
M Protein SerPl Elph-Mcnc: 0.3 g/dL — ABNORMAL HIGH
Total Protein ELP: 5.9 g/dL — ABNORMAL LOW (ref 6.0–8.5)

## 2022-01-09 NOTE — Progress Notes (Signed)
? ? ?HEMATOLOGY/ONCOLOGY PHONE VISIT NOTE ? ?Date of Service: .01/06/2022 ? ? ?Patient Care Team: ?[name], [name], MD as PCP - General ?Larey Dresser, MD as PCP - Advanced Heart Failure (Cardiology) ?Larey Dresser, MD as PCP - Cardiology (Cardiology) ?Erline Levine, MD as Attending Physician (Neurosurgery) ?Jarome Matin, MD as Consulting Physician (Dermatology) ?Rigoberto Noel, MD as Consulting Physician (Pulmonary Disease) ?Alda Berthold, DO as Consulting Physician (Neurology) ? ? ?CHIEF COMPLAINTS/PURPOSE OF CONSULTATION:  ?Follow-up for continued evaluation and management of multiple myeloma ? ? ?HISTORY OF PRESENTING ILLNESS:  ?Please see previous notes for details on initial presentation ? ?INTERVAL HISTORY: ? ?Patient is here for continued evaluation and management of multiple myeloma.  He completed his palliative radiation therapy for his active bone lesions.  No significant side effects of radiation at this time. ?He continues to be on his maintenance Revlimid without any acute issues.  No nausea no vomiting no rashes no diarrhea. ?Labs done today were reviewed in detail with the patient.[name] ?He notes no other acute new bone pains. ? ? ? MEDICAL HISTORY:  ?Past Medical History:  ?Diagnosis Date  ? Ascending aortic aneurysm   ? Bicuspid aortic valve   ? Cancer [facility])   ? CHF NYHA class I (no symptoms from ordinary activities), acute, diastolic ([place])   ? Dysrhythmia 2009  ? A fib  ? Fatty liver   ? mild  ? Fracture   ? left proximal humerus  ? GERD (gastroesophageal reflux disease)   ? GI bleeding 07/21/2018  ? post polypectomy  ? Hemorrhoids   ? HTN (hypertension)   ? Hypercholesteremia   ? Hypokalemia   ? Internal hemorrhoids   ? LBP (low back pain)   ? Moderate aortic stenosis   ? Osteoarthritis   ? Paroxysmal atrial fibrillation (HCC)   ? a. new onset Afib in 07/2008. He underwent ibutilide cardioversion successfully. b. Recurrence 01/2013 s/p TEE/DCCV - was on Xarelto but he stopped it as he was  convinced it was causing joint pn. c. Recurrence 01/2016 - spont conv to NSR. Pt took Eliquis x1 mo then declined further anticoag. d. Recurrence 07/2016.  ? Pneumonia   ? Tubular adenoma of colon   ? ? ?SURGICAL HISTORY: ?Past Surgical History:  ?Procedure Laterality Date  ? BACK SURGERY  x12 years ago  ? CARDIAC CATHETERIZATION  2020  ? CARDIAC VALVE REPLACEMENT  2020  ? CARDIOVERSION N/A 01/26/2013  ? Procedure: CARDIOVERSION;  Surgeon: Larey Dresser, MD;  Location: [place];  Service: Cardiovascular;  Laterality: N/A;  ? CARDIOVERSION N/A 10/28/2017  ? Procedure: CARDIOVERSION;  Surgeon: Larey Dresser, MD;  Location: [facility] ENDOSCOPY;  Service: Cardiovascular;  Laterality: N/A;  ? CARDIOVERSION N/A 03/03/2018  ? Procedure: CARDIOVERSION;  Surgeon: Lelon Perla, MD;  Location: [facility] ENDOSCOPY;  Service: Cardiovascular;  Laterality: N/A;  ? CARDIOVERSION N/A 09/19/2019  ? Procedure: CARDIOVERSION;  Surgeon: Larey Dresser, MD;  Location: [facility] ENDOSCOPY;  Service: Cardiovascular;  Laterality: N/A;  ? COLONOSCOPY    ? COLONOSCOPY  07/17/2018  ? at [facility]  ? HEMORRHOID SURGERY    ? INGUINAL HERNIA REPAIR Left 03/18/2020  ? Procedure: OPEN LEFT INGUINAL HERNIA REPAIR;  Surgeon: Alphonsa Overall, MD;  Location: [place];  Service: General;  Laterality: Left;  ? LUMBAR LAMINECTOMY    ? ORIF HUMERUS FRACTURE Left 01/15/2020  ? Procedure: OPEN REDUCTION INTERNAL FIXATION (ORIF) PROXIMAL HUMERUS FRACTURE;  Surgeon: Nicholes Stairs, MD;  Location: [place];  Service:  Orthopedics;  Laterality: Left;  ? POLYPECTOMY    ? RIGHT HEART CATH N/A 08/20/2019  ? Procedure: RIGHT HEART CATH;  Surgeon: Larey Dresser, MD;  Location: [place] CV LAB;  Service: Cardiovascular;  Laterality: N/A;  ? RIGHT/LEFT HEART CATH AND CORONARY ANGIOGRAPHY N/A 03/07/2019  ? Procedure: RIGHT/LEFT HEART CATH AND CORONARY ANGIOGRAPHY;  Surgeon: Burnell Blanks, MD;  Location: [place] CV LAB;  Service: Cardiovascular;  Laterality: N/A;  ? TAVAR   04/2019  ? TEE WITHOUT CARDIOVERSION N/A 01/26/2013  ? Procedure: TRANSESOPHAGEAL ECHOCARDIOGRAM (TEE);  Surgeon: Larey Dresser, MD;  Location: [place];  Service: Cardiovascular;  Laterality: N/A;  ? TEE WITHOUT CARDIOVERSION N/A 10/28/2017  ? Procedure: TRANSESOPHAGEAL ECHOCARDIOGRAM (TEE);  Surgeon: Larey Dresser, MD;  Location: [facility] ENDOSCOPY;  Service: Cardiovascular;  Laterality: N/A;  ? TEE WITHOUT CARDIOVERSION N/A 05/08/2019  ? Procedure: TRANSESOPHAGEAL ECHOCARDIOGRAM (TEE);  Surgeon: Burnell Blanks, MD;  Location: [place] CV LAB;  Service: Open Heart Surgery;  Laterality: N/A;  ? TEE WITHOUT CARDIOVERSION N/A 09/19/2019  ? Procedure: TRANSESOPHAGEAL ECHOCARDIOGRAM (TEE);  Surgeon: Larey Dresser, MD;  Location: [facility] ENDOSCOPY;  Service: Cardiovascular;  Laterality: N/A;  ? TRANSCATHETER AORTIC VALVE REPLACEMENT, TRANSFEMORAL N/A 05/08/2019  ? Procedure: TRANSCATHETER AORTIC VALVE REPLACEMENT, TRANSFEMORAL;  Surgeon: Burnell Blanks, MD;  Location: [place] CV LAB;  Service: Open Heart Surgery;  Laterality: N/A;  ? ? ?SOCIAL HISTORY: ?Social History  ? ?Socioeconomic History  ? Marital status: Married  ?  Spouse name: Not on file  ? Number of children: 0  ? Years of education: Not on file  ? Highest education level: Not on file  ?Occupational History  ? Occupation: Retired Chief Financial Officer  ?  Employer: [employer]  ?Tobacco Use  ? Smoking status: Never  ? Smokeless tobacco: Never  ?Vaping Use  ? Vaping Use: Never used  ?Substance and Sexual Activity  ? Alcohol use: Not Currently  ? Drug use: No  ? Sexual activity: Yes  ?Other Topics Concern  ? Not on file  ?Social History Narrative  ? Patient lives in [place] w/ his wife. He is a native of [place]. 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  ? Right-handed  ? Caffeine: 2 cups coffee per day  ? Two story home  ? ?Social Determinants of Health  ? ?Financial Resource Strain: Not on file  ?Food  Insecurity: Not on file  ?Transportation Needs: Not on file  ?Physical Activity: Not on file  ?Stress: Not on file  ?Social Connections: Not on file  ?Intimate Partner Violence: Not on file  ? ? ?FAMILY HISTORY: ?Family History  ?Problem Relation Age of Onset  ? Colon cancer Mother 25  ? Hypertension Other   ? Coronary artery disease Neg Hx   ? Colon polyps Neg Hx   ? Esophageal cancer Neg Hx   ? Rectal cancer Neg Hx   ? Stomach cancer Neg Hx   ? ? ?ALLERGIES:  is allergic to xarelto [rivaroxaban], corticosteroids, ramipril, zolpidem, and benazepril. ? ?MEDICATIONS:  ?Current Outpatient Medications  ?Medication Sig Dispense Refill  ? amoxicillin-clavulanate (AUGMENTIN) 875-125 MG tablet Take 1 tablet by mouth 2 (two) times daily for 7 days. 14 tablet 0  ? b complex vitamins tablet Take 1 tablet by mouth daily. 100 tablet 3  ? Calcium-Magnesium 500-250 MG TABS Take 1 tablet by mouth daily.    ? Carboxymethylcellul-Glycerin (LUBRICATING EYE DROPS OP) Place 1  drop into both eyes daily as needed (dry eyes).    ? Cholecalciferol (VITAMIN D) 50 MCG (2000 UT) tablet Take 2,000 Units by mouth daily.    ? diclofenac Sodium (VOLTAREN) 1 % GEL Apply 1 application topically 2 (two) times daily as needed (pain.).    ? ELIQUIS 5 MG TABS tablet TAKE 1 TABLET BY MOUTH TWICE A DAY 60 tablet 11  ? gabapentin (NEURONTIN) 300 MG capsule Take 300 mg by mouth at bedtime.    ? ipratropium (ATROVENT) 0.06 % nasal spray Place 2 sprays into the nose 3 (three) times daily. 15 mL 2  ? lenalidomide (REVLIMID) 5 MG capsule Take 1 capsule (5 mg total) by mouth daily. Take 1 capsule (5 mg total) by mouth daily for 21 days then take 7 days off 21 capsule 0  ? metoprolol succinate (TOPROL-XL) 25 MG 24 hr tablet Take 1 tablet (25 mg total) by mouth 2 (two) times daily. NEEDS FOLLOW UP FOR MORE REFILLS 60 tablet 0  ? metroNIDAZOLE (METROCREAM) 0.75 % cream Apply 1 application topically 2 (two) times daily.    ? oxyCODONE (OXY IR/ROXICODONE) 5 MG  immediate release tablet Take 1-2 tablets (5-10 mg total) by mouth 2 (two) times daily as needed for severe pain. 60 tablet 0  ? polyethylene glycol powder (GLYCOLAX/MIRALAX) 17 GM/SCOOP powder Take 17-34 g b

## 2022-01-10 ENCOUNTER — Encounter: Payer: Self-pay | Admitting: Hematology

## 2022-01-17 NOTE — Progress Notes (Signed)
?  Radiation Oncology         [phone] ?________________________________ ? ?Name: [name] [name] MRN: [id number]  ?Date of Service: 01/18/2022  DOB: June 04, 1941 ? ?Post Treatment Telephone Note ? ?Diagnosis:   Multiple Myeloma with radiographic evidence of recurrence in the right acetabulum ? ?Intent: Palliative ? ?Radiation Treatment Dates: 11/30/2021 through 12/11/2021 ?Site Technique Total Dose (Gy) Dose per Fx (Gy) Completed Fx Beam Energies  ?Ilium, Right: Pelvis_R 3D 25/25 2.5 10/10 15X  ? ?Narrative: The patient tolerated radiation therapy relatively well. He did still have some pain at the conclusion of radiation but did not require refills of pain medication. He reports a rash in the area of radiation in the right thigh area that started about 2 weeks ago. He thinks it may be related to sun exposure or his Revlimid. He's never had this type of reaction. He describes intense itching of the area. He has started a pill for management, and thought it might be an infection.  ? ?Impression/Plan: ?1. Multiple Myeloma with radiographic evidence of recurrence in the right acetabulum. The patient has been doing well since completion of radiotherapy. We discussed that we would be happy to continue to follow him as needed, but he will also continue to follow up with Dr. Irene Limbo in medical oncology. I offered to see him regarding his rash, but he would prefer to see dermatology for now. I encouraged  ? ? ? ? ?Carola Rhine, PAC  ? ? ? ? ?

## 2022-01-18 ENCOUNTER — Ambulatory Visit
Admission: RE | Admit: 2022-01-18 | Discharge: 2022-01-18 | Disposition: A | Discharge status: Home / Self Care | Payer: Medicare Other | Source: Ambulatory Visit | Attending: Hematology | Admitting: Hematology

## 2022-01-18 ENCOUNTER — Telehealth: Payer: Self-pay | Admitting: *Deleted

## 2022-01-18 DIAGNOSIS — C9000 Multiple myeloma not having achieved remission: Secondary | ICD-10-CM | POA: Insufficient documentation

## 2022-01-18 NOTE — Telephone Encounter (Signed)
Spoke with the patient's wife who was inquiring about where to send the photo of his rash.  Patient verbalized that he would like to come in to have it looked at.  I informed him I would let the PA know and I will return call with time for him to be seen.  Patient and wife verbalized understanding and will await return call. ? ?[name]. [name], BSN  ?

## 2022-01-18 NOTE — Telephone Encounter (Signed)
I spoke with the patient to let him know we received and reviewed his photo and do not think this is in relation to his recent radiation therapy.  I informed him we have reached out to Dr. Grier Mitts office to get his input and he would receive a phone call from them.  He states he has an appointment to see Dermatology on Wednesday this week. ? ?[name]. [name], BSN  ?

## 2022-01-20 DIAGNOSIS — L308 Other specified dermatitis: Secondary | ICD-10-CM | POA: Diagnosis not present

## 2022-01-20 DIAGNOSIS — I872 Venous insufficiency (chronic) (peripheral): Secondary | ICD-10-CM | POA: Diagnosis not present

## 2022-01-20 DIAGNOSIS — I8312 Varicose veins of left lower extremity with inflammation: Secondary | ICD-10-CM | POA: Diagnosis not present

## 2022-01-20 DIAGNOSIS — I8311 Varicose veins of right lower extremity with inflammation: Secondary | ICD-10-CM | POA: Diagnosis not present

## 2022-01-23 ENCOUNTER — Other Ambulatory Visit (HOSPITAL_COMMUNITY): Payer: Self-pay | Admitting: Cardiology

## 2022-02-02 ENCOUNTER — Other Ambulatory Visit: Payer: Self-pay

## 2022-02-02 DIAGNOSIS — C9000 Multiple myeloma not having achieved remission: Secondary | ICD-10-CM

## 2022-02-02 MED ORDER — LENALIDOMIDE 5 MG PO CAPS: 5.0000 mg | ORAL_CAPSULE | Freq: Every day | ORAL | 0 refills | Status: DC

## 2022-02-10 ENCOUNTER — Encounter: Payer: Self-pay | Admitting: Internal Medicine

## 2022-02-10 ENCOUNTER — Ambulatory Visit (INDEPENDENT_AMBULATORY_CARE_PROVIDER_SITE_OTHER): Payer: Medicare Other | Admitting: Internal Medicine

## 2022-02-10 DIAGNOSIS — C9000 Multiple myeloma not having achieved remission: Secondary | ICD-10-CM

## 2022-02-10 DIAGNOSIS — G622 Polyneuropathy due to other toxic agents: Secondary | ICD-10-CM | POA: Diagnosis not present

## 2022-02-10 DIAGNOSIS — I480 Paroxysmal atrial fibrillation: Secondary | ICD-10-CM

## 2022-02-10 MED ORDER — GABAPENTIN 300 MG PO CAPS: 300.0000 mg | ORAL_CAPSULE | Freq: Every day | ORAL | 3 refills | Status: DC

## 2022-02-10 MED ORDER — OXYCODONE HCL 5 MG PO TABS: 5.0000 mg | ORAL_TABLET | Freq: Two times a day (BID) | ORAL | 0 refills | Status: DC | PRN

## 2022-02-10 NOTE — Assessment & Plan Note (Signed)
Blue-Emu cream was recommended to use 2-3 times a day Oxycodone prn, Gabapentin for chronic pain in the legs, and feet  Potential benefits of a long term opioids use as well as potential risks (i.e. addiction risk, apnea etc) and complications (i.e. Somnolence, constipation and others) were explained to the patient and were aknowledged.

## 2022-02-10 NOTE — Assessment & Plan Note (Signed)
On Revlimid S/p recent XRT

## 2022-02-10 NOTE — Patient Instructions (Signed)
Blue-Emu cream -- use 2-3 times a day ? ?

## 2022-02-10 NOTE — Progress Notes (Signed)
Subjective:  Patient ID: [name] [name], male    DOB: 04-Mar-1941  Age: 81 y.o. MRN: [id number]  CC: No chief complaint on file.   HPI [name] [name] presents for MM, chronic pain. He had XRT to the bone in 11/2021.  C/o fatigue, neuropathy   Outpatient Medications Prior to Visit  Medication Sig Dispense Refill   b complex vitamins tablet Take 1 tablet by mouth daily. 100 tablet 3   Calcium-Magnesium 500-250 MG TABS Take 1 tablet by mouth daily.     Carboxymethylcellul-Glycerin (LUBRICATING EYE DROPS OP) Place 1 drop into both eyes daily as needed (dry eyes).     Cholecalciferol (VITAMIN D) 50 MCG (2000 UT) tablet Take 2,000 Units by mouth daily.     diclofenac Sodium (VOLTAREN) 1 % GEL Apply 1 application topically 2 (two) times daily as needed (pain.).     ELIQUIS 5 MG TABS tablet TAKE 1 TABLET BY MOUTH TWICE A DAY 60 tablet 11   ipratropium (ATROVENT) 0.06 % nasal spray Place 2 sprays into the nose 3 (three) times daily. 15 mL 2   lenalidomide (REVLIMID) 5 MG capsule Take 1 capsule (5 mg total) by mouth daily. Take 1 capsule (5 mg total) by mouth daily for 21 days then take 7 days off 21 capsule 0   metoprolol succinate (TOPROL-XL) 25 MG 24 hr tablet TAKE 1 TABLET (25 MG TOTAL) BY MOUTH 2 (TWO) TIMES DAILY. NEEDS FOLLOW UP FOR MORE REFILLS 60 tablet 3   metroNIDAZOLE (METROCREAM) 0.75 % cream Apply 1 application topically 2 (two) times daily.     polyethylene glycol powder (GLYCOLAX/MIRALAX) 17 GM/SCOOP powder Take 17-34 g by mouth 2 (two) times daily as needed for moderate constipation. 500 g 5   senna-docusate (SENOKOT-S) 8.6-50 MG tablet Take 2 tablets by mouth at bedtime.     spironolactone (ALDACTONE) 25 MG tablet TAKE 1 TABLET BY MOUTH EVERY DAY 90 tablet 3   Vitamin D, Ergocalciferol, (DRISDOL) 1.25 MG (50000 UNIT) CAPS capsule TAKE 1 CAPSULE BY MOUTH ONE TIME PER WEEK 12 capsule 3   gabapentin (NEURONTIN) 300 MG capsule Take 300 mg by mouth at bedtime.     oxyCODONE (OXY  IR/ROXICODONE) 5 MG immediate release tablet Take 1-2 tablets (5-10 mg total) by mouth 2 (two) times daily as needed for severe pain. 60 tablet 0   No facility-administered medications prior to visit.    ROS: Review of Systems  Constitutional:  Positive for fatigue. Negative for appetite change and unexpected weight change.  HENT:  Negative for congestion, nosebleeds, sneezing, sore throat and trouble swallowing.   Eyes:  Negative for itching and visual disturbance.  Respiratory:  Negative for cough.   Cardiovascular:  Negative for chest pain, palpitations and leg swelling.  Gastrointestinal:  Negative for abdominal distention, blood in stool, diarrhea and nausea.  Genitourinary:  Negative for frequency and hematuria.  Musculoskeletal:  Positive for arthralgias, back pain and gait problem. Negative for joint swelling and neck pain.  Skin:  Negative for rash.  Neurological:  Positive for weakness. Negative for dizziness, tremors and speech difficulty.  Psychiatric/Behavioral:  Negative for agitation, dysphoric mood and sleep disturbance. The patient is not nervous/anxious.    Objective:  BP 122/78 (BP Location: Left Arm, Patient Position: Sitting, Cuff Size: Normal)   Pulse 65   Temp 97.9 F (36.6 C) (Oral)   Ht 5' 10" (1.778 m)   Wt 186 lb (84.4 kg)   SpO2 99%   BMI 26.69 kg/m   BP  Readings from Last 3 Encounters:  02/10/22 122/78  01/06/22 (!) 143/78  11/20/21 131/75    Wt Readings from Last 3 Encounters:  02/10/22 186 lb (84.4 kg)  01/06/22 183 lb 1.6 oz (83.1 kg)  11/20/21 182 lb 8 oz (82.8 kg)    Physical Exam Constitutional:      General: He is not in acute distress.    Appearance: Normal appearance. He is well-developed.     Comments: NAD  Eyes:     Conjunctiva/sclera: Conjunctivae normal.     Pupils: Pupils are equal, round, and reactive to light.  Neck:     Thyroid: No thyromegaly.     Vascular: No JVD.  Cardiovascular:     Rate and Rhythm: Normal rate  and regular rhythm.     Heart sounds: Normal heart sounds. No murmur heard.   No friction rub. No gallop.  Pulmonary:     Effort: Pulmonary effort is normal. No respiratory distress.     Breath sounds: Normal breath sounds. No wheezing or rales.  Chest:     Chest wall: No tenderness.  Abdominal:     General: Bowel sounds are normal. There is no distension.     Palpations: Abdomen is soft. There is no mass.     Tenderness: There is no abdominal tenderness. There is no guarding or rebound.  Musculoskeletal:        General: Tenderness present. Normal range of motion.     Cervical back: Normal range of motion.  Lymphadenopathy:     Cervical: No cervical adenopathy.  Skin:    General: Skin is warm and dry.     Findings: No rash.  Neurological:     Mental Status: He is alert and oriented to person, place, and time.     Cranial Nerves: No cranial nerve deficit.     Motor: Weakness present. No abnormal muscle tone.     Coordination: Coordination abnormal.     Gait: Gait abnormal.     Deep Tendon Reflexes: Reflexes are normal and symmetric.  Psychiatric:        Behavior: Behavior normal.        Thought Content: Thought content normal.        Judgment: Judgment normal.   Antalgic gait   Lab Results  Component Value Date   WBC 4.6 01/06/2022   HGB 13.6 01/06/2022   HCT 40.8 01/06/2022   PLT 125 (L) 01/06/2022   GLUCOSE 92 01/06/2022   CHOL 144 08/01/2018   TRIG 167.0 (H) 08/01/2018   HDL 28.20 (L) 08/01/2018   LDLDIRECT 151.5 11/20/2007   LDLCALC 82 08/01/2018   ALT 26 01/06/2022   AST 29 01/06/2022   NA 140 01/06/2022   K 4.1 01/06/2022   CL 108 01/06/2022   CREATININE 1.13 01/06/2022   BUN 21 01/06/2022   CO2 25 01/06/2022   TSH 5.338 (H) 06/23/2020   PSA 4.20 (H) 08/01/2018   INR 1.2 01/07/2020   HGBA1C 5.8 (H) 05/04/2019    No results found.  Assessment & Plan:   Problem List Items Addressed This Visit     Multiple myeloma ([place])    On Revlimid S/p recent  XRT       Relevant Medications   gabapentin (NEURONTIN) 300 MG capsule   oxyCODONE (OXY IR/ROXICODONE) 5 MG immediate release tablet   PAF (paroxysmal atrial fibrillation) (HCC)    Blue-Emu cream was recommended to use 2-3 times a day Oxycodone prn, Gabapentin for chronic pain in the legs,  and feet  Potential benefits of a long term opioids use as well as potential risks (i.e. addiction risk, apnea etc) and complications (i.e. Somnolence, constipation and others) were explained to the patient and were aknowledged.      Peripheral neuropathy    Blue-Emu cream was recommended to use 2-3 times a day Oxycodone prn, Gabapentin for chronic pain in the legs, and feet  Potential benefits of a long term opioids use as well as potential risks (i.e. addiction risk, apnea etc) and complications (i.e. Somnolence, constipation and others) were explained to the patient and were aknowledged.      Relevant Medications   gabapentin (NEURONTIN) 300 MG capsule      Meds ordered this encounter  Medications   gabapentin (NEURONTIN) 300 MG capsule    Sig: Take 1 capsule (300 mg total) by mouth at bedtime.    Dispense:  90 capsule    Refill:  3   oxyCODONE (OXY IR/ROXICODONE) 5 MG immediate release tablet    Sig: Take 1-2 tablets (5-10 mg total) by mouth 2 (two) times daily as needed for severe pain.    Dispense:  120 tablet    Refill:  0      Follow-up: Return in about 3 months (around 05/13/2022) for a follow-up visit.  [name], MD

## 2022-02-11 ENCOUNTER — Inpatient Hospital Stay: Payer: Medicare Other | Attending: Hematology

## 2022-02-11 DIAGNOSIS — I509 Heart failure, unspecified: Secondary | ICD-10-CM | POA: Insufficient documentation

## 2022-02-11 DIAGNOSIS — Z7901 Long term (current) use of anticoagulants: Secondary | ICD-10-CM | POA: Insufficient documentation

## 2022-02-11 DIAGNOSIS — C9002 Multiple myeloma in relapse: Secondary | ICD-10-CM | POA: Insufficient documentation

## 2022-02-11 DIAGNOSIS — C7951 Secondary malignant neoplasm of bone: Secondary | ICD-10-CM | POA: Insufficient documentation

## 2022-02-11 DIAGNOSIS — D696 Thrombocytopenia, unspecified: Secondary | ICD-10-CM | POA: Insufficient documentation

## 2022-02-11 DIAGNOSIS — I110 Hypertensive heart disease with heart failure: Secondary | ICD-10-CM | POA: Insufficient documentation

## 2022-02-11 DIAGNOSIS — Q231 Congenital insufficiency of aortic valve: Secondary | ICD-10-CM | POA: Insufficient documentation

## 2022-02-11 DIAGNOSIS — I4891 Unspecified atrial fibrillation: Secondary | ICD-10-CM | POA: Insufficient documentation

## 2022-02-11 DIAGNOSIS — Z800 Family history of malignant neoplasm of digestive organs: Secondary | ICD-10-CM | POA: Insufficient documentation

## 2022-02-12 ENCOUNTER — Other Ambulatory Visit: Payer: Self-pay

## 2022-02-12 ENCOUNTER — Inpatient Hospital Stay: Payer: Medicare Other

## 2022-02-12 ENCOUNTER — Telehealth: Payer: Self-pay | Admitting: *Deleted

## 2022-02-12 DIAGNOSIS — I509 Heart failure, unspecified: Secondary | ICD-10-CM | POA: Diagnosis not present

## 2022-02-12 DIAGNOSIS — C9002 Multiple myeloma in relapse: Secondary | ICD-10-CM | POA: Diagnosis not present

## 2022-02-12 DIAGNOSIS — Z7901 Long term (current) use of anticoagulants: Secondary | ICD-10-CM | POA: Diagnosis not present

## 2022-02-12 DIAGNOSIS — I4891 Unspecified atrial fibrillation: Secondary | ICD-10-CM | POA: Diagnosis not present

## 2022-02-12 DIAGNOSIS — C9000 Multiple myeloma not having achieved remission: Secondary | ICD-10-CM

## 2022-02-12 DIAGNOSIS — I110 Hypertensive heart disease with heart failure: Secondary | ICD-10-CM | POA: Diagnosis not present

## 2022-02-12 DIAGNOSIS — Z800 Family history of malignant neoplasm of digestive organs: Secondary | ICD-10-CM | POA: Diagnosis not present

## 2022-02-12 DIAGNOSIS — Q231 Congenital insufficiency of aortic valve: Secondary | ICD-10-CM | POA: Diagnosis not present

## 2022-02-12 DIAGNOSIS — D696 Thrombocytopenia, unspecified: Secondary | ICD-10-CM | POA: Diagnosis not present

## 2022-02-12 DIAGNOSIS — C7951 Secondary malignant neoplasm of bone: Secondary | ICD-10-CM | POA: Diagnosis not present

## 2022-02-12 LAB — CMP (CANCER CENTER ONLY)
ALT: 41 U/L (ref 0–44)
AST: 34 U/L (ref 15–41)
Albumin: 4.2 g/dL (ref 3.5–5.0)
Alkaline Phosphatase: 62 U/L (ref 38–126)
Anion gap: 7 (ref 5–15)
BUN: 20 mg/dL (ref 8–23)
CO2: 27 mmol/L (ref 22–32)
Calcium: 9.6 mg/dL (ref 8.9–10.3)
Chloride: 108 mmol/L (ref 98–111)
Creatinine: 1.05 mg/dL (ref 0.61–1.24)
GFR, Estimated: >60 mL/min (ref >60)
Glucose, Bld: 119 mg/dL — ABNORMAL HIGH (ref 70–99)
Potassium: 4.3 mmol/L (ref 3.5–5.1)
Sodium: 142 mmol/L (ref 135–145)
Total Bilirubin: 0.5 mg/dL (ref 0.3–1.2)
Total Protein: 6.9 g/dL (ref 6.5–8.1)

## 2022-02-12 LAB — CBC WITH DIFFERENTIAL (CANCER CENTER ONLY)
Abs Immature Granulocytes: 0.11 10*3/uL — ABNORMAL HIGH (ref 0.00–0.07)
Basophils Absolute: 0.1 10*3/uL (ref 0.0–0.1)
Basophils Relative: 2 %
Eosinophils Absolute: 0.3 10*3/uL (ref 0.0–0.5)
Eosinophils Relative: 7 %
HCT: 44.1 % (ref 39.0–52.0)
Hemoglobin: 14.8 g/dL (ref 13.0–17.0)
Immature Granulocytes: 3 %
Lymphocytes Relative: 21 %
Lymphs Abs: 0.9 10*3/uL (ref 0.7–4.0)
MCH: 29.9 pg (ref 26.0–34.0)
MCHC: 33.6 g/dL (ref 30.0–36.0)
MCV: 89.1 fL (ref 80.0–100.0)
Monocytes Absolute: 0.5 10*3/uL (ref 0.1–1.0)
Monocytes Relative: 12 %
Neutro Abs: 2.4 10*3/uL (ref 1.7–7.7)
Neutrophils Relative %: 55 %
Platelet Count: 155 10*3/uL (ref 150–400)
RBC: 4.95 MIL/uL (ref 4.22–5.81)
RDW: 12.9 % (ref 11.5–15.5)
WBC Count: 4.3 10*3/uL (ref 4.0–10.5)
nRBC: 0 % (ref 0.0–0.2)

## 2022-02-12 NOTE — Telephone Encounter (Signed)
Patient missed lab appt yesterday (he said due to MyChart mix up?) and he wants to come today if possible Schedule message sent to call him to come to schedule lab appt for today

## 2022-02-15 LAB — KAPPA/LAMBDA LIGHT CHAINS
Kappa free light chain: 39.3 mg/L — ABNORMAL HIGH (ref 3.3–19.4)
Kappa, lambda light chain ratio: 3.28 — ABNORMAL HIGH (ref 0.26–1.65)
Lambda free light chains: 12 mg/L (ref 5.7–26.3)

## 2022-02-17 ENCOUNTER — Other Ambulatory Visit: Payer: Self-pay

## 2022-02-17 ENCOUNTER — Inpatient Hospital Stay (HOSPITAL_BASED_OUTPATIENT_CLINIC_OR_DEPARTMENT_OTHER): Payer: Medicare Other | Admitting: Hematology

## 2022-02-17 VITALS — BP 133/82 | HR 61 | Temp 98.3°F | Resp 18 | Ht 70.0 in | Wt 174.9 lb

## 2022-02-17 DIAGNOSIS — C9000 Multiple myeloma not having achieved remission: Secondary | ICD-10-CM | POA: Diagnosis not present

## 2022-02-17 DIAGNOSIS — I4891 Unspecified atrial fibrillation: Secondary | ICD-10-CM | POA: Diagnosis not present

## 2022-02-17 DIAGNOSIS — I110 Hypertensive heart disease with heart failure: Secondary | ICD-10-CM | POA: Diagnosis not present

## 2022-02-17 DIAGNOSIS — C7951 Secondary malignant neoplasm of bone: Secondary | ICD-10-CM | POA: Diagnosis not present

## 2022-02-17 DIAGNOSIS — C9002 Multiple myeloma in relapse: Secondary | ICD-10-CM | POA: Diagnosis not present

## 2022-02-17 DIAGNOSIS — D696 Thrombocytopenia, unspecified: Secondary | ICD-10-CM | POA: Diagnosis not present

## 2022-02-17 DIAGNOSIS — Q231 Congenital insufficiency of aortic valve: Secondary | ICD-10-CM | POA: Diagnosis not present

## 2022-02-17 LAB — MULTIPLE MYELOMA PANEL, SERUM
Albumin SerPl Elph-Mcnc: 4 g/dL (ref 2.9–4.4)
Albumin/Glob SerPl: 1.7 (ref 0.7–1.7)
Alpha 1: 0.2 g/dL (ref 0.0–0.4)
Alpha2 Glob SerPl Elph-Mcnc: 0.6 g/dL (ref 0.4–1.0)
B-Globulin SerPl Elph-Mcnc: 0.9 g/dL (ref 0.7–1.3)
Gamma Glob SerPl Elph-Mcnc: 0.7 g/dL (ref 0.4–1.8)
Globulin, Total: 2.4 g/dL (ref 2.2–3.9)
IgA: 101 mg/dL (ref 61–437)
IgG (Immunoglobin G), Serum: 607 mg/dL (ref 603–1613)
IgM (Immunoglobulin M), Srm: 49 mg/dL (ref 15–143)
M Protein SerPl Elph-Mcnc: 0.2 g/dL — ABNORMAL HIGH
Total Protein ELP: 6.4 g/dL (ref 6.0–8.5)

## 2022-02-17 NOTE — Progress Notes (Signed)
HEMATOLOGY/ONCOLOGY CLINIC NOTE  Date of Service: 02/17/2022   Patient Care Team: Cassandria Anger, MD as PCP - General Larey Dresser, MD as PCP - Advanced Heart Failure (Cardiology) Larey Dresser, MD as PCP - Cardiology (Cardiology) Erline Levine, MD as Attending Physician (Neurosurgery) Jarome Matin, MD as Consulting Physician (Dermatology) Rigoberto Noel, MD as Consulting Physician (Pulmonary Disease) Alda Berthold, DO as Consulting Physician (Neurology)   CHIEF COMPLAINTS/PURPOSE OF CONSULTATION:  Follow-up for continued evaluation and management of multiple myeloma  HISTORY OF PRESENTING ILLNESS:  Please see previous notes for details on initial presentation  INTERVAL HISTORY:  [name] [name] Is a 81 y.o. male here for continued evaluation and management of multiple myeloma. He reports He is doing well.  He reports a rash in right anterior calf. We discussed trying an antihistamine like Claritin in addition to an over the counter cortisone ointment which he was agreeable to.   He was advised to continue to maintain hydration by drinking at least 48-64 oz per day.  He notes cramping that occurs daily in hands and in feet somewhat. Grade 1  He continues to be on his maintenance Revlimid without any prohibitive toxicities at this time.  He expresses interest in traveling internationally to his home country of [place]. We discussed that this could be possible and that we could optimize his treatment planning to allow for this.  He reports treatment related anxieties.  He continues to take vitamin D 2000 units daily with 50k units 1x p.o weekly on Friday.  No nausea or vomiting. No new or abnormal bowel habits. No new focal bone pains. No other new or acute focal symptoms.  Labs done 02/12/2022 were reviewed in detail with the patient. CBC and CMP unremarkable. MM panel stable -- slightly improved to 0.2g/dl K/L light chains show elevated KFLC of 39.3 and  K/L light chain ratio of 3.28.   MEDICAL HISTORY:  Past Medical History:  Diagnosis Date   Ascending aortic aneurysm (HCC)    Bicuspid aortic valve    Cancer (HCC)    CHF NYHA class I (no symptoms from ordinary activities), acute, diastolic ([place])    Dysrhythmia 2009   A fib   Fatty liver    mild   Fracture    left proximal humerus   GERD (gastroesophageal reflux disease)    GI bleeding 07/21/2018   post polypectomy   Hemorrhoids    HTN (hypertension)    Hypercholesteremia    Hypokalemia    Internal hemorrhoids    LBP (low back pain)    Moderate aortic stenosis    Osteoarthritis    Paroxysmal atrial fibrillation ([place])    a. new onset Afib in 07/2008. He underwent ibutilide cardioversion successfully. b. Recurrence 01/2013 s/p TEE/DCCV - was on Xarelto but he stopped it as he was convinced it was causing joint pn. c. Recurrence 01/2016 - spont conv to NSR. Pt took Eliquis x1 mo then declined further anticoag. d. Recurrence 07/2016.   Pneumonia    Tubular adenoma of colon     SURGICAL HISTORY: Past Surgical History:  Procedure Laterality Date   BACK SURGERY  x12 years ago   [place] VALVE REPLACEMENT  2020   CARDIOVERSION N/A 01/26/2013   Procedure: CARDIOVERSION;  Surgeon: Larey Dresser, MD;  Location: [place];  Service: Cardiovascular;  Laterality: N/A;   CARDIOVERSION N/A 10/28/2017   Procedure: CARDIOVERSION;  Surgeon: Larey Dresser, MD;  Location:  [facility];  Service: Cardiovascular;  Laterality: N/A;   CARDIOVERSION N/A 03/03/2018   Procedure: CARDIOVERSION;  Surgeon: Lelon Perla, MD;  Location: [facility] ENDOSCOPY;  Service: Cardiovascular;  Laterality: N/A;   CARDIOVERSION N/A 09/19/2019   Procedure: CARDIOVERSION;  Surgeon: Larey Dresser, MD;  Location: [facility] ENDOSCOPY;  Service: Cardiovascular;  Laterality: N/A;   COLONOSCOPY     COLONOSCOPY  07/17/2018   at [place] Left 03/18/2020    Procedure: OPEN LEFT INGUINAL HERNIA REPAIR;  Surgeon: Alphonsa Overall, MD;  Location: [place];  Service: General;  Laterality: Left;   LUMBAR LAMINECTOMY     ORIF HUMERUS FRACTURE Left 01/15/2020   Procedure: OPEN REDUCTION INTERNAL FIXATION (ORIF) PROXIMAL HUMERUS FRACTURE;  Surgeon: Nicholes Stairs, MD;  Location: [place];  Service: Orthopedics;  Laterality: Left;   POLYPECTOMY     RIGHT HEART CATH N/A 08/20/2019   Procedure: RIGHT HEART CATH;  Surgeon: Larey Dresser, MD;  Location: [place] CV LAB;  Service: Cardiovascular;  Laterality: N/A;   RIGHT/LEFT HEART CATH AND CORONARY ANGIOGRAPHY N/A 03/07/2019   Procedure: RIGHT/LEFT HEART CATH AND CORONARY ANGIOGRAPHY;  Surgeon: Burnell Blanks, MD;  Location: [place] CV LAB;  Service: Cardiovascular;  Laterality: N/A;   TAVAR  04/2019   TEE WITHOUT CARDIOVERSION N/A 01/26/2013   Procedure: TRANSESOPHAGEAL ECHOCARDIOGRAM (TEE);  Surgeon: Larey Dresser, MD;  Location: [place];  Service: Cardiovascular;  Laterality: N/A;   TEE WITHOUT CARDIOVERSION N/A 10/28/2017   Procedure: TRANSESOPHAGEAL ECHOCARDIOGRAM (TEE);  Surgeon: Larey Dresser, MD;  Location: [facility] ENDOSCOPY;  Service: Cardiovascular;  Laterality: N/A;   TEE WITHOUT CARDIOVERSION N/A 05/08/2019   Procedure: TRANSESOPHAGEAL ECHOCARDIOGRAM (TEE);  Surgeon: Burnell Blanks, MD;  Location: [place] CV LAB;  Service: Open Heart Surgery;  Laterality: N/A;   TEE WITHOUT CARDIOVERSION N/A 09/19/2019   Procedure: TRANSESOPHAGEAL ECHOCARDIOGRAM (TEE);  Surgeon: Larey Dresser, MD;  Location: [facility] ENDOSCOPY;  Service: Cardiovascular;  Laterality: N/A;   TRANSCATHETER AORTIC VALVE REPLACEMENT, TRANSFEMORAL N/A 05/08/2019   Procedure: TRANSCATHETER AORTIC VALVE REPLACEMENT, TRANSFEMORAL;  Surgeon: Burnell Blanks, MD;  Location: [place] CV LAB;  Service: Open Heart Surgery;  Laterality: N/A;    SOCIAL HISTORY: Social History   Socioeconomic History    Marital status: Married    Spouse name: Not on file   Number of children: 0   Years of education: Not on file   Highest education level: Not on file  Occupational History   Occupation: Retired Lobbyist: [employer]  Tobacco Use   Smoking status: Never   Smokeless tobacco: Never  Vaping Use   Vaping Use: Never used  Substance and Sexual Activity   Alcohol use: Not Currently   Drug use: No   Sexual activity: Yes  Other Topics Concern   Not on file  Social History Narrative   Patient lives in [place] w/ his wife. He is a native of [place]. 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   Right-handed   Caffeine: 2 cups coffee per day   Two story home   Social Determinants of Health   Financial Resource Strain: Not on file  Food Insecurity: Not on file  Transportation Needs: Not on file  Physical Activity: Not on file  Stress: Not on file  Social Connections: Not on file  Intimate Partner Violence: Not on file  FAMILY HISTORY: Family History  Problem Relation Age of Onset   Colon cancer Mother 58   Hypertension Other    Coronary artery disease Neg Hx    Colon polyps Neg Hx    Esophageal cancer Neg Hx    Rectal cancer Neg Hx    Stomach cancer Neg Hx     ALLERGIES:  is allergic to xarelto [rivaroxaban], corticosteroids, ramipril, zolpidem, and benazepril.  MEDICATIONS:  Current Outpatient Medications  Medication Sig Dispense Refill   b complex vitamins tablet Take 1 tablet by mouth daily. 100 tablet 3   Calcium-Magnesium 500-250 MG TABS Take 1 tablet by mouth daily.     Carboxymethylcellul-Glycerin (LUBRICATING EYE DROPS OP) Place 1 drop into both eyes daily as needed (dry eyes).     Cholecalciferol (VITAMIN D) 50 MCG (2000 UT) tablet Take 2,000 Units by mouth daily.     diclofenac Sodium (VOLTAREN) 1 % GEL Apply 1 application topically 2 (two) times daily as needed (pain.).     ELIQUIS 5 MG TABS tablet TAKE 1  TABLET BY MOUTH TWICE A DAY 60 tablet 11   gabapentin (NEURONTIN) 300 MG capsule Take 1 capsule (300 mg total) by mouth at bedtime. 90 capsule 3   ipratropium (ATROVENT) 0.06 % nasal spray Place 2 sprays into the nose 3 (three) times daily. 15 mL 2   lenalidomide (REVLIMID) 5 MG capsule Take 1 capsule (5 mg total) by mouth daily. Take 1 capsule (5 mg total) by mouth daily for 21 days then take 7 days off 21 capsule 0   metoprolol succinate (TOPROL-XL) 25 MG 24 hr tablet TAKE 1 TABLET (25 MG TOTAL) BY MOUTH 2 (TWO) TIMES DAILY. NEEDS FOLLOW UP FOR MORE REFILLS 60 tablet 3   metroNIDAZOLE (METROCREAM) 0.75 % cream Apply 1 application topically 2 (two) times daily.     oxyCODONE (OXY IR/ROXICODONE) 5 MG immediate release tablet Take 1-2 tablets (5-10 mg total) by mouth 2 (two) times daily as needed for severe pain. 120 tablet 0   polyethylene glycol powder (GLYCOLAX/MIRALAX) 17 GM/SCOOP powder Take 17-34 g by mouth 2 (two) times daily as needed for moderate constipation. 500 g 5   senna-docusate (SENOKOT-S) 8.6-50 MG tablet Take 2 tablets by mouth at bedtime.     spironolactone (ALDACTONE) 25 MG tablet TAKE 1 TABLET BY MOUTH EVERY DAY 90 tablet 3   Vitamin D, Ergocalciferol, (DRISDOL) 1.25 MG (50000 UNIT) CAPS capsule TAKE 1 CAPSULE BY MOUTH ONE TIME PER WEEK 12 capsule 3   No current facility-administered medications for this visit.    REVIEW OF SYSTEMS:   10 Point review of Systems was done is negative except as noted above.   PHYSICAL EXAMINATION: Vitals:   02/17/22 1242  BP: 133/82  Pulse: 61  Resp: 18  Temp: 98.3 F (36.8 C)  SpO2: 99%    Wt Readings from Last 3 Encounters:  02/17/22 174 lb 14.4 oz (79.3 kg)  02/10/22 186 lb (84.4 kg)  01/06/22 183 lb 1.6 oz (83.1 kg)   Body mass index is 25.1 kg/m.    ECOG FS:2 - Symptomatic, <50% confined to bed NAD GENERAL:alert, in no acute distress and comfortable SKIN: no acute rashes, no significant lesions EYES: conjunctiva are  pink and non-injected, sclera anicteric NECK: supple, no JVD LYMPH:  no palpable lymphadenopathy in the cervical, axillary or inguinal regions LUNGS: clear to auscultation b/l with normal respiratory effort HEART: regular rate & rhythm ABDOMEN:  normoactive bowel sounds , non tender, not distended. Extremity:  no pedal edema PSYCH: alert & oriented x 3 with fluent speech NEURO: no focal motor/sensory deficits  Exam performed in chair.  LABORATORY DATA:  I have reviewed the data as listed  .    Latest Ref Rng & Units 02/12/2022   12:13 PM 01/06/2022    1:36 PM 11/20/2021   10:30 AM  CBC  WBC 4.0 - 10.5 K/uL 4.3   4.6   6.1    Hemoglobin 13.0 - 17.0 g/dL 14.8   13.6   14.8    Hematocrit 39.0 - 52.0 % 44.1   40.8   45.1    Platelets 150 - 400 K/uL 155   125   149      .    Latest Ref Rng & Units 02/12/2022   12:13 PM 01/06/2022    1:36 PM 11/20/2021   10:30 AM  CMP  Glucose 70 - 99 mg/dL 119   92   90    BUN 8 - 23 mg/dL _0 Creatinine 0.61 - 1.24 mg/dL 1.05   1.13   1.08    Sodium 135 - 145 mmol/L 142   140   141    Potassium 3.5 - 5.1 mmol/L 4.3   4.1   4.2    Chloride 98 - 111 mmol/L 108   108   108    CO2 22 - 32 mmol/L _1 Calcium 8.9 - 10.3 mg/dL 9.6   9.2   9.8    Total Protein 6.5 - 8.1 g/dL 6.9   6.2   6.6    Total Bilirubin 0.3 - 1.2 mg/dL 0.5   0.6   0.6    Alkaline Phos 38 - 126 U/L 62   55   58    AST 15 - 41 U/L 34   29   24    ALT 0 - 44 U/L 41   26   22        05/24/2019 Bone Marrow Biopsy    04/16/2019 Surgical Pathology:   Surgical Pathology  CASE: [id number]  PATIENT: [facility]  Bone Marrow Report      Clinical History: Multiple myeloma, remission status unspecified ([place])  (BH)      DIAGNOSIS:   BONE MARROW, ASPIRATE, CLOT, CORE:  -Variably cellular bone marrow with trilineage hematopoiesis and 2%  plasma cells  -See comment   PERIPHERAL BLOOD:  -Slight thrombocytopenia   COMMENT:   The bone  marrow is variably cellular with trilineage hematopoiesis  including abundant megakaryocytes in the more cellular areas.  In this  background, the plasma cells represent 2% of all cells in the aspirate  with interstitial cells and a few very minute clusters in the  clot/biopsy sections.  The latter in particular display kappa light  chain excess/restriction most suggestive of minimal residual plasma cell  neoplasm despite limited findings.  Correlation with cytogenetic and  FISH studies is recommended.    RADIOGRAPHIC STUDIES: I have personally reviewed the radiological images as listed and agreed with the findings in the report. No results found.  ASSESSMENT & PLAN:   81 yo   #1 Plasma cell myeloma-currently in relapse  03/28/2019 MRI pelvis w/wo contrast revealed "1. Destructive bone lesions as detailed above. Findings most consistent with metastatic disease. PET-CT may be helpful for further evaluation and to establish a primary tumor. The right pelvic bone lesions should be amenable  to image guided biopsy but a PET scan may demonstrate easier/safer biopsy sites. 2. No intrapelvic mass or adenopathy. 3. Benign intraosseous lipoma involving the left anterior superior acetabulum."  04/06/2019 PET whole body revealed "1. Diffuse osseous metastatic disease as detailed above without findings for a primary neoplasm in the chest, abdomen or pelvis. The large destructive lesion involving the right ischium should be amenable to image guided biopsy. 2. Two small retroperitoneal lymph nodes and 1 small right obturator node showing hypermetabolism."   04/16/2019 Posterior right pelvis bone biopsy revealed "PLASMA CELL NEOPLASM"  05/24/2019 Bone Marrow Biopsy revealed "BONE MARROW: - CELLULAR MARROW WITH INVOLVEMENT BY PLASMA CELL NEOPLASM (20%) PERIPHERAL BLOOD: - MORPHOLOGICALLY UNREMARKABLE"  05/24/2019 FISH Panel revealed "ABNORMAL result with 11q+, 14q+ and +17"  #2 Severe aortic stenosis  with bicuspid aortic valve -10/26/2018 ECHO revealed AVA at 0.8 cm2 and LV EF of 60-65% -05/08/2019 pt had a Transfemoral Transcatheter Aortic Valve Replacement  PET CT scan from 2/2 which shows There is persistent low level radiotracer uptake associated with the large lucent lesion within the superior right acetabulum. Additionally, there is a new focal area of increased radiotracer uptake above the background low level activity within this lesion with SUV max of 3.92. On the previous exam there was relatively homogeneous low level uptake within this lesion within SUV max of 2.43. Imaging findings are concerning for residual metabolically active tumor. Patient has seen radiation oncology since his last visit and they have decided to proceed with involved site radiation therapy to the FDG avid lesion to reduce the risk of fracture.  -He had his bone marrow biopsy on 11/03/2021 which shows 2% abnormal plasma cells.  PLAN: -Labs done 02/12/2022 were reviewed in detail with the patient. CBC and CMP unremarkable. MM panel - M spike stable at 0.2g/dl K/L light chains show elevated KFLC of 39.3 and K/L light chain ratio of 3.28. -Last myeloma labs showed stable M spike of 0.3 g/dL -He has completed his palliative involved site radiation therapy and tolerated this well without any acute issues. -No significant symptoms suggestive of myeloma progression at this time. -No issues with intolerance to maintenance dose Revlimid at this time. -Continue Revlimid 5 mg p.o. daily 3 weeks on 1 week off.. Will increase to 64m if tolerated. -Continue Eliquis which she is already on for his atrial fibrillation but also for VTE prophylaxis. -Continue Gabapentin 600 mg p.o. at bedtime.  -Continue Oxycodone prn for neuropathy pain -Continue Zometa q384month -Start Claritin 1x p.o daily in addition to an over the counter cortisone ointment for management of mild rash on legs. -He expresses preference for scheduling  everything to be done the same day to avoid multiple day visits in a week. -He was advised to continue to maintain hydration by drinking at least 48-64 oz per day. -He expresses interest in traveling internationally to his home country. We discussed that this could be possible and that we could optimize his treatment planning to allow for this. -He continues to take vitamin D 2000 units daily with 50k units 1x p.o weekly on Friday. -RTC in 2 months with labs. -Next dose Zometa in 2 months.   FOLLOW UP: RTC with Dr KaIrene Limboith labs and next dose of Zometa in 2 months  The total time spent in the appointment was 25 minutes*.  All of the patient's questions were answered with apparent satisfaction. The patient knows to call the clinic with any problems, questions or concerns.   GaSullivan LoneD MS  AAHIVMS [facility] Hematology/Oncology Physician [facility]  .*Total Encounter Time as defined by the Centers for Medicare and Medicaid Services includes, in addition to the face-to-face time of a patient visit (documented in the note above) non-face-to-face time: obtaining and reviewing outside history, ordering and reviewing medications, tests or procedures, care coordination (communications with other health care professionals or caregivers) and documentation in the medical record.  I, Melene Muller, am acting as scribe for Dr. Sullivan Lone, MD. .I have reviewed the above documentation for accuracy and completeness, and I agree with the above. Brunetta Genera MD

## 2022-02-18 ENCOUNTER — Telehealth: Payer: Self-pay | Admitting: Hematology

## 2022-02-18 NOTE — Telephone Encounter (Signed)
Left message with follow-up appointment per 6/7 los.

## 2022-02-19 ENCOUNTER — Inpatient Hospital Stay: Payer: Medicare Other

## 2022-02-19 ENCOUNTER — Other Ambulatory Visit: Payer: Self-pay

## 2022-02-19 VITALS — BP 123/70 | HR 52 | Temp 98.0°F | Resp 18

## 2022-02-19 DIAGNOSIS — C7951 Secondary malignant neoplasm of bone: Secondary | ICD-10-CM | POA: Diagnosis not present

## 2022-02-19 DIAGNOSIS — I110 Hypertensive heart disease with heart failure: Secondary | ICD-10-CM | POA: Diagnosis not present

## 2022-02-19 DIAGNOSIS — Z7189 Other specified counseling: Secondary | ICD-10-CM

## 2022-02-19 DIAGNOSIS — C9000 Multiple myeloma not having achieved remission: Secondary | ICD-10-CM

## 2022-02-19 DIAGNOSIS — C9002 Multiple myeloma in relapse: Secondary | ICD-10-CM | POA: Diagnosis not present

## 2022-02-19 DIAGNOSIS — Q231 Congenital insufficiency of aortic valve: Secondary | ICD-10-CM | POA: Diagnosis not present

## 2022-02-19 DIAGNOSIS — D696 Thrombocytopenia, unspecified: Secondary | ICD-10-CM | POA: Diagnosis not present

## 2022-02-19 DIAGNOSIS — I4891 Unspecified atrial fibrillation: Secondary | ICD-10-CM | POA: Diagnosis not present

## 2022-02-19 MED ORDER — ZOLEDRONIC ACID 4 MG/100ML IV SOLN
4.0000 mg | Freq: Once | INTRAVENOUS | Status: AC
  Administered 2022-02-19: 4 mg via INTRAVENOUS
  Filled 2022-02-19: qty 100

## 2022-02-19 NOTE — Patient Instructions (Signed)

## 2022-02-21 ENCOUNTER — Encounter: Payer: Self-pay | Admitting: Hematology

## 2022-02-25 ENCOUNTER — Other Ambulatory Visit: Payer: Self-pay

## 2022-02-25 DIAGNOSIS — C9000 Multiple myeloma not having achieved remission: Secondary | ICD-10-CM

## 2022-02-25 MED ORDER — LENALIDOMIDE 5 MG PO CAPS: 5.0000 mg | ORAL_CAPSULE | Freq: Every day | ORAL | 0 refills | Status: DC

## 2022-03-10 ENCOUNTER — Other Ambulatory Visit (HOSPITAL_COMMUNITY): Payer: Self-pay | Admitting: Cardiology

## 2022-03-26 ENCOUNTER — Other Ambulatory Visit: Payer: Self-pay

## 2022-03-26 DIAGNOSIS — C9000 Multiple myeloma not having achieved remission: Secondary | ICD-10-CM

## 2022-03-26 MED ORDER — LENALIDOMIDE 5 MG PO CAPS: 5.0000 mg | ORAL_CAPSULE | Freq: Every day | ORAL | 0 refills | Status: DC

## 2022-03-29 ENCOUNTER — Other Ambulatory Visit (HOSPITAL_COMMUNITY): Payer: Self-pay | Admitting: Cardiology

## 2022-04-03 ENCOUNTER — Other Ambulatory Visit (HOSPITAL_COMMUNITY): Payer: Self-pay | Admitting: Cardiology

## 2022-04-03 ENCOUNTER — Other Ambulatory Visit: Payer: Self-pay | Admitting: Internal Medicine

## 2022-04-20 ENCOUNTER — Other Ambulatory Visit: Payer: Self-pay

## 2022-04-20 DIAGNOSIS — C9000 Multiple myeloma not having achieved remission: Secondary | ICD-10-CM

## 2022-04-20 MED ORDER — LENALIDOMIDE 5 MG PO CAPS: 5.0000 mg | ORAL_CAPSULE | Freq: Every day | ORAL | 0 refills | Status: DC

## 2022-04-26 DIAGNOSIS — H2513 Age-related nuclear cataract, bilateral: Secondary | ICD-10-CM | POA: Diagnosis not present

## 2022-04-26 DIAGNOSIS — H52223 Regular astigmatism, bilateral: Secondary | ICD-10-CM | POA: Diagnosis not present

## 2022-04-26 DIAGNOSIS — H25013 Cortical age-related cataract, bilateral: Secondary | ICD-10-CM | POA: Diagnosis not present

## 2022-04-26 DIAGNOSIS — H5211 Myopia, right eye: Secondary | ICD-10-CM | POA: Diagnosis not present

## 2022-04-26 DIAGNOSIS — H524 Presbyopia: Secondary | ICD-10-CM | POA: Diagnosis not present

## 2022-04-26 DIAGNOSIS — H35431 Paving stone degeneration of retina, right eye: Secondary | ICD-10-CM | POA: Diagnosis not present

## 2022-04-28 ENCOUNTER — Other Ambulatory Visit (HOSPITAL_COMMUNITY): Payer: Self-pay | Admitting: Cardiology

## 2022-05-13 ENCOUNTER — Other Ambulatory Visit: Payer: Self-pay | Admitting: *Deleted

## 2022-05-13 DIAGNOSIS — C9000 Multiple myeloma not having achieved remission: Secondary | ICD-10-CM

## 2022-05-14 ENCOUNTER — Inpatient Hospital Stay: Payer: Medicare Other | Attending: Hematology

## 2022-05-14 ENCOUNTER — Inpatient Hospital Stay (HOSPITAL_BASED_OUTPATIENT_CLINIC_OR_DEPARTMENT_OTHER): Payer: Medicare Other | Admitting: Hematology

## 2022-05-14 ENCOUNTER — Inpatient Hospital Stay: Payer: Medicare Other

## 2022-05-14 ENCOUNTER — Other Ambulatory Visit: Payer: Self-pay

## 2022-05-14 VITALS — BP 134/74 | HR 51 | Temp 98.3°F | Resp 16 | Ht 70.0 in | Wt 176.1 lb

## 2022-05-14 DIAGNOSIS — D696 Thrombocytopenia, unspecified: Secondary | ICD-10-CM | POA: Diagnosis not present

## 2022-05-14 DIAGNOSIS — C9000 Multiple myeloma not having achieved remission: Secondary | ICD-10-CM | POA: Diagnosis not present

## 2022-05-14 DIAGNOSIS — C9002 Multiple myeloma in relapse: Secondary | ICD-10-CM | POA: Insufficient documentation

## 2022-05-14 DIAGNOSIS — Z7189 Other specified counseling: Secondary | ICD-10-CM

## 2022-05-14 DIAGNOSIS — C7951 Secondary malignant neoplasm of bone: Secondary | ICD-10-CM | POA: Insufficient documentation

## 2022-05-14 LAB — CBC WITH DIFFERENTIAL (CANCER CENTER ONLY)
Abs Immature Granulocytes: 0.03 10*3/uL (ref 0.00–0.07)
Basophils Absolute: 0.1 10*3/uL (ref 0.0–0.1)
Basophils Relative: 1 %
Eosinophils Absolute: 0.2 10*3/uL (ref 0.0–0.5)
Eosinophils Relative: 5 %
HCT: 43.7 % (ref 39.0–52.0)
Hemoglobin: 14.5 g/dL (ref 13.0–17.0)
Immature Granulocytes: 1 %
Lymphocytes Relative: 17 %
Lymphs Abs: 0.8 10*3/uL (ref 0.7–4.0)
MCH: 30.1 pg (ref 26.0–34.0)
MCHC: 33.2 g/dL (ref 30.0–36.0)
MCV: 90.9 fL (ref 80.0–100.0)
Monocytes Absolute: 0.4 10*3/uL (ref 0.1–1.0)
Monocytes Relative: 8 %
Neutro Abs: 3.4 10*3/uL (ref 1.7–7.7)
Neutrophils Relative %: 68 %
Platelet Count: 120 10*3/uL — ABNORMAL LOW (ref 150–400)
RBC: 4.81 MIL/uL (ref 4.22–5.81)
RDW: 14.3 % (ref 11.5–15.5)
WBC Count: 5 10*3/uL (ref 4.0–10.5)
nRBC: 0 % (ref 0.0–0.2)

## 2022-05-14 LAB — CMP (CANCER CENTER ONLY)
ALT: 47 U/L — ABNORMAL HIGH (ref 0–44)
AST: 34 U/L (ref 15–41)
Albumin: 4.4 g/dL (ref 3.5–5.0)
Alkaline Phosphatase: 61 U/L (ref 38–126)
Anion gap: 5 (ref 5–15)
BUN: 21 mg/dL (ref 8–23)
CO2: 29 mmol/L (ref 22–32)
Calcium: 9.4 mg/dL (ref 8.9–10.3)
Chloride: 105 mmol/L (ref 98–111)
Creatinine: 1.16 mg/dL (ref 0.61–1.24)
GFR, Estimated: >60 mL/min (ref >60)
Glucose, Bld: 99 mg/dL (ref 70–99)
Potassium: 4.3 mmol/L (ref 3.5–5.1)
Sodium: 139 mmol/L (ref 135–145)
Total Bilirubin: 0.7 mg/dL (ref 0.3–1.2)
Total Protein: 6.4 g/dL — ABNORMAL LOW (ref 6.5–8.1)

## 2022-05-14 MED ORDER — ZOLEDRONIC ACID 4 MG/100ML IV SOLN
4.0000 mg | Freq: Once | INTRAVENOUS | Status: AC
  Administered 2022-05-14: 4 mg via INTRAVENOUS
  Filled 2022-05-14: qty 100

## 2022-05-14 MED ORDER — SODIUM CHLORIDE 0.9 % IV SOLN: Freq: Once | INTRAVENOUS | Status: AC

## 2022-05-14 NOTE — Patient Instructions (Signed)

## 2022-05-18 ENCOUNTER — Ambulatory Visit (HOSPITAL_COMMUNITY)
Admission: RE | Admit: 2022-05-18 | Discharge: 2022-05-18 | Disposition: A | Discharge status: Home / Self Care | Payer: Medicare Other | Source: Ambulatory Visit | Attending: Cardiology | Admitting: Cardiology

## 2022-05-18 ENCOUNTER — Encounter (HOSPITAL_COMMUNITY): Payer: Self-pay | Admitting: Cardiology

## 2022-05-18 VITALS — BP 110/70 | HR 64 | Wt 175.6 lb

## 2022-05-18 DIAGNOSIS — Z7901 Long term (current) use of anticoagulants: Secondary | ICD-10-CM | POA: Insufficient documentation

## 2022-05-18 DIAGNOSIS — Z7961 Long term (current) use of immunomodulator: Secondary | ICD-10-CM | POA: Diagnosis not present

## 2022-05-18 DIAGNOSIS — R531 Weakness: Secondary | ICD-10-CM | POA: Insufficient documentation

## 2022-05-18 DIAGNOSIS — I110 Hypertensive heart disease with heart failure: Secondary | ICD-10-CM | POA: Diagnosis not present

## 2022-05-18 DIAGNOSIS — Z79899 Other long term (current) drug therapy: Secondary | ICD-10-CM | POA: Insufficient documentation

## 2022-05-18 DIAGNOSIS — R0789 Other chest pain: Secondary | ICD-10-CM | POA: Insufficient documentation

## 2022-05-18 DIAGNOSIS — Z953 Presence of xenogenic heart valve: Secondary | ICD-10-CM | POA: Diagnosis not present

## 2022-05-18 DIAGNOSIS — I5032 Chronic diastolic (congestive) heart failure: Secondary | ICD-10-CM | POA: Diagnosis not present

## 2022-05-18 DIAGNOSIS — I2510 Atherosclerotic heart disease of native coronary artery without angina pectoris: Secondary | ICD-10-CM | POA: Diagnosis not present

## 2022-05-18 DIAGNOSIS — I480 Paroxysmal atrial fibrillation: Secondary | ICD-10-CM | POA: Diagnosis not present

## 2022-05-18 DIAGNOSIS — G629 Polyneuropathy, unspecified: Secondary | ICD-10-CM | POA: Insufficient documentation

## 2022-05-18 DIAGNOSIS — Z952 Presence of prosthetic heart valve: Secondary | ICD-10-CM

## 2022-05-18 DIAGNOSIS — C9000 Multiple myeloma not having achieved remission: Secondary | ICD-10-CM | POA: Diagnosis not present

## 2022-05-18 DIAGNOSIS — Q231 Congenital insufficiency of aortic valve: Secondary | ICD-10-CM | POA: Diagnosis not present

## 2022-05-18 LAB — MULTIPLE MYELOMA PANEL, SERUM
Albumin SerPl Elph-Mcnc: 3.9 g/dL (ref 2.9–4.4)
Albumin/Glob SerPl: 1.8 — ABNORMAL HIGH (ref 0.7–1.7)
Alpha 1: 0.2 g/dL (ref 0.0–0.4)
Alpha2 Glob SerPl Elph-Mcnc: 0.5 g/dL (ref 0.4–1.0)
B-Globulin SerPl Elph-Mcnc: 0.8 g/dL (ref 0.7–1.3)
Gamma Glob SerPl Elph-Mcnc: 0.6 g/dL (ref 0.4–1.8)
Globulin, Total: 2.2 g/dL (ref 2.2–3.9)
IgA: 97 mg/dL (ref 61–437)
IgG (Immunoglobin G), Serum: 595 mg/dL — ABNORMAL LOW (ref 603–1613)
IgM (Immunoglobulin M), Srm: 42 mg/dL (ref 15–143)
M Protein SerPl Elph-Mcnc: 0.2 g/dL — ABNORMAL HIGH
Total Protein ELP: 6.1 g/dL (ref 6.0–8.5)

## 2022-05-18 LAB — KAPPA/LAMBDA LIGHT CHAINS
Kappa free light chain: 46 mg/L — ABNORMAL HIGH (ref 3.3–19.4)
Kappa, lambda light chain ratio: 4.51 — ABNORMAL HIGH (ref 0.26–1.65)
Lambda free light chains: 10.2 mg/L (ref 5.7–26.3)

## 2022-05-18 NOTE — Patient Instructions (Signed)
There has been no changes to your medications.  Your physician has requested that you have an echocardiogram. Echocardiography is a painless test that uses sound waves to create images of your heart. It provides your doctor with information about the size and shape of your heart and how well your heart's chambers and valves are working. This procedure takes approximately one hour. There are no restrictions for this procedure.  Your physician recommends that you schedule a follow-up appointment in: 6 months ( March 2024) ** please call the office in January to arrange your follow up appointment **   If you have any questions or concerns before your next appointment please send [place] a message through [place] or call our office at [phone].    TO LEAVE A MESSAGE FOR THE NURSE SELECT OPTION 2, PLEASE LEAVE A MESSAGE INCLUDING: YOUR NAME DATE OF BIRTH CALL BACK NUMBER REASON FOR CALL**this is important as we prioritize the call backs  YOU WILL RECEIVE A CALL BACK THE SAME DAY AS LONG AS YOU CALL BEFORE 4:00 PM  At the [facility], you and your health needs are our priority. As part of our continuing mission to provide you with exceptional heart care, we have created designated Provider Care Teams. These Care Teams include your primary Cardiologist (physician) and Advanced Practice Providers (APPs- Physician Assistants and Nurse Practitioners) who all work together to provide you with the care you need, when you need it.   You may see any of the following providers on your designated Care Team at your next follow up: Dr Glori Bickers Dr [name] Dr. Roxana Hires, NP Lyda Jester, [place] [facility] [place], [place] Forestine Na, NP Audry Riles, PharmD   Please be sure to bring in all your medications bottles to every appointment.

## 2022-05-19 ENCOUNTER — Other Ambulatory Visit: Payer: Self-pay

## 2022-05-19 DIAGNOSIS — C9000 Multiple myeloma not having achieved remission: Secondary | ICD-10-CM

## 2022-05-19 MED ORDER — LENALIDOMIDE 5 MG PO CAPS: 5.0000 mg | ORAL_CAPSULE | Freq: Every day | ORAL | 0 refills | Status: DC

## 2022-05-19 NOTE — Progress Notes (Signed)
Date:  05/19/2022  ID:  [name] [name], DOB 1940/09/18, MRN [id number]  Provider location: [facility] Type of Visit: Established patient   PCP:  [name], [name], MD  Cardiologist: Dr. Aundra Dubin   History of Present Illness: [name] [name] is a 81 y.o. male who has history of HTN, aortic stenosis and paroxysmal atrial fibrillation. He was hospitalized with atrial fibrillation/RVR in 5/14.  He had TEE-guided cardioversion.  TEE showed bicuspid aortic valve with mild AS and a moderately dilated ascending aorta.  Most echo in 4/17 showed moderate aortic stenosis and MRA chest in 11/17 showed 4.1 cm ascending aorta.    He was on Xarelto for anticoagulation but stopped it as he was convinced it was causing joint pains.  He then refused to start any other anticoagulation at that time.     In 5/17, he had been back in atrial fibrillation for several days and was symptomatic.  I started him on Eliquis and planned TEE-guided DCCV given significant symptoms, but he converted back to NSR on his own.  He continued Eliquis for about 1 month then stopped it on his own.     In 11/17, he was hospitalized with symptomatic atrial fibrillation with RVR.  I started him on diltiazem CD and Eliquis with plan for TEE-guided DCCV.  However, he converted back to NSR on his own. He stopped the diltiazem but has continued the Eliquis.    He was admitted in 2/18 with fever, LUL PNA and left-sided pleural effusion.  Thoracentesis on left was suggestive of parapneumonic effusion.  He was in the hospital 11 days.  During that time, he went into atrial fibrillation with RVR.  He was started on amiodarone and went back into NSR.  Amiodarone was subsequently stopped.    Recurrent atrial fibrillation with RVR in 2/19, felt more fatigued.  He underwent TEE-guided DCCV back to NSR.    He had a lower GI bleed in 11/19 post-polypectomy.  He has since restarted on Eliquis.    Coronary CTA was done in  2/20, this showed mild nonobstructive CAD.  Echo in 2/20 showed EF 60-65%, bicuspid aortic valve with severe AS.   LHC in 6/20 showed no significant coronary disease.  In 8/20, he had TAVR with Irwin 3 THV x 2 (valve in valve due to peri-valvular leak initially).  Post-procedure echoes have shown elevated gradient across the aortic valve though dimensionless index has only been in the mildly stenotic range.  Last echo in 9/20 showed mean aortic valve gradient 26 mmHg with dimensionless index 0.54.   Patient has additionally been diagnosed with multiple myeloma.  He was treated with Revlimid, Velcade, and prednisone.   He was admitted early in 1/21 with atrial fibrillation/RVR associated with ischemic colitis.  He underwent TEE-guided DCCV to NSR.   He was readmitted later in 1/21 with PNA.    He fell in 4/21, tripped and fractured his left proximal humerus.  He had ORIF in 5/21.   In 7/21, he had a left inguinal hernia repair.  Echo in 8/21 showed EF 60-65%, normal RV, ascending aorta 45 mm, stable TAVR valve with mean gradient 15 mmHg.   Echo in 8/22 showed EF 60-65% RV ok.  Bioprosthetic AVR s/p TAVR with mean gradient 13 IVC small.   Patient returns for followup of atrial fibrillation, TAVR.  He is stable clinically.  Walks with cane for balance.  Still has neuropathic pain in his feet that  is his main limitation. No dyspnea walking on flat ground.  He rides a stationary bike for exercise.  Feels weak in general.  Rare atypical chest pain.  No lightheadedness, palpitations.  He is in NSR today. He continues on Revlimid for multiple myeloma.   ECG (personally reviewed): NSR, LVH  Labs (5/14): K 3.7, creatinine 0.9, BNP 2261=>109, TSH normal Labs (7/14): K 3.5, creatinine 1.0 Labs (12/14): K 3.8, creatinine 1.0, LDL particle number 1374, LDL 107, TSH normal Labs (6/16): TSH normal, K 3.8, creatinine 0.84, HCT 42.4, LDL 85, LFTs normal Labs (3/17): K 3.8, creatinine 0.87 Labs  (5/17): K 4.5, creatinine 0.97, HCT 43.7 Labs (11/17): K 3.2, creatinine 0.81, LDL 71, HDL 24 Labs (2/18): K 3.8, creatinine 0.89 Labs (2/19): K 3.7, creatinine 0.83, hgb 16 Labs (11/19): LDL 82 Labs (1/20): TSH elevated but free T4 normal, K 4, creatinine 0.85, hgb 13.5 Labs (2/20): TSH mildly elevated but free T4 normal, K 4, creatinine 0.85 Labs (5/20): ESR 48 Labs (9/20): hgb 14.1, plts 38 => 82, K 3.3, creatinine 0.98 => 1.06 Labs (11/20): BNP 126.5 Labs (12/20): hgb 11.9, K 3.2, creatinine 0.87 Labs (1/21): K 4.8, creatinine 1.42, AST 30, ALT 60 Labs (4/21): K 4.5, creatinine 1.22, hgb 12.3, plts 122 Labs (7/21): hgb 13.7, K 4.6, creatinine 1.31, AST 36, ALT 76 Labs (3/22): K 3.7, creatinine 1.05, LFTs normal Labs (9/23): K 4.3, creatinine 1.16, hgb 14.5   Allergies (verified):  No Known Drug Allergies    Past Medical History:  1. Hypertension: ACEI cough. Edema with amlodipine.  2. Atrial fibrillation. The patient had new-onset atrial fibrillation in November 2009. He underwent ibutilide cardioversion successfully. He has had 1-2 episodes/year that are short-lived that likely are atrial fibrillation with RVR. He was admitted in 5/14 with atrial fibrillation/RVR and had TEE-guided DCCV.  Atrial fibrillation again in 5/17, converted back to NSR spontaneously.  CHADSVASC score 2.  - Atrial fibrillation 2/19 with TEE-guided DCCV.  - 1/21 TEE-guided DCCV 3. Hypercholesterolemia.  4. Bicuspid aortic valve disorder: Echo (7/11): EF 55-60%, mild LV hypertrophy, mild aortic stenosis with mean gradient 19 mmHg and peak gradient 36 mmHg.  TEE (5/14): EF 55%, mild LVH, bicuspid aortic valve with mild AS (mean gradient 13 mmHg), ascending aorta 4.5 cm.  Echo (9/15) with EF 65-70%, moderate AS (mean gradient 23 mmHg), mild AI, ascending aorta 4.1 cm, mild MR.   - Echo (4/17) with EF 65-70%, moderate aortic stenosis with mean gradient 27 mmHg, PASP 31 mmHg, ascending aorta 4.4 cm.  - Echo (2/18)  with EF 55-60%, bicuspid aortic valve with moderate aortic stenosis (underestimated gradient).  - TEE (2/19): EF 60-65%, moderate LVH, bicuspid aortic valve with moderate AS with mean gradient 28 mmHg and AVA 1.2 cm^2, 4.4 cm ascending aorta.  - Echo (2/20): EF 60-65%, mild LVH, bicuspid aortic valve with severe AS (mean gradient 48 mmHg, AVA 0.8 cm^2).  - TAVR 8/20 with valve in valve Edwards Sapien 3 THVs (because of peri-valvular leak after initial valve placed).  - Echo (9/20): EF > 65%, mild LVH, mild RV dilation with normal RV systolic function, mean aortic valve gradient 26 mmHg with dimensionless index 0.54, IVC normal.  - TEE (1/21): EF 60-65%, moderate LVH, normal RV, s/p valve-in-valve TAVR with mean gradient 8 mmHg and no PVL.  - Echo (8/21): EF 60-65%, normal RV, ascending aorta 45 mm, stable TAVR valve with mean gradient 15 mmHg.  - Echo (8/22): EF 70-75%, s/p TAVR mean gradient 12 mmHg.  5. Osteoarthritis.  6. Low back pain.  7. Chest pain: ETT-myoview (12/11) with 10:51 exercise, no chest pain, no significant ST changes, EF 69%, no evidence for ischemia or infarction.  - Coronary CTA (2/20): calcium score 41, nonobstructive CAD.  - LHC (6/20): No significant CAD.  8. Ascending aortic aneurysm: Associated with bicuspid aortic valve.  4.5 cm by TEE in 5/14.   MRA chest (6/14) with bicuspid aortic valve, 4.3 cm ascending aortic aneurysm.  MRA chest (10/15) with 4.2 cm ascending aorta (bicuspid aortic valve noted).  Echo (4/17) with ascending aorta diameter 4.4 cm.  - MRA chest (11/17) with 4.1 cm ascending aorta.  - Echo (2/19): 4.4 cm ascending aorta.  - Coronary CTA in 2/20 showed 4.3 cm ascending aorta.  - Echo (8/21) with 4.5 cm ascending aorta.  9. Colonic polyps: GI bleeding in 11/19 s/p polypectomy.  10. Multiple myeloma.  11. Bradycardia 12. Thrombocytopenia: likely related to chemotherapy for multiple myeloma.  13. PVCs: Zio monitor in 10/20 showed 9.4% PVCs.  14. Chronic  diastolic CHF: RHC (97/98) with mean RA 3, PA 22/2, mean PCWP 6, CI 2.74 15. Left inguinal hernia: s/p repair.    Current Outpatient Medications  Medication Sig Dispense Refill   b complex vitamins tablet Take 1 tablet by mouth daily. 100 tablet 3   Calcium-Magnesium 500-250 MG TABS Take 1 tablet by mouth daily.     Carboxymethylcellul-Glycerin (LUBRICATING EYE DROPS OP) Place 1 drop into both eyes daily as needed (dry eyes).     Cholecalciferol (VITAMIN D) 50 MCG (2000 UT) tablet Take 2,000 Units by mouth daily.     diclofenac Sodium (VOLTAREN) 1 % GEL Apply 1 application topically 2 (two) times daily as needed (pain.).     ELIQUIS 5 MG TABS tablet TAKE 1 TABLET BY MOUTH TWICE A DAY 60 tablet 11   gabapentin (NEURONTIN) 300 MG capsule Take 1 capsule (300 mg total) by mouth at bedtime. 90 capsule 3   ipratropium (ATROVENT) 0.06 % nasal spray Place 2 sprays into the nose 3 (three) times daily. 15 mL 2   metoprolol succinate (TOPROL-XL) 25 MG 24 hr tablet TAKE 1 TABLET (25 MG TOTAL) BY MOUTH 2 (TWO) TIMES DAILY. NEEDS FOLLOW UP FOR MORE REFILLS 180 tablet 1   metroNIDAZOLE (METROCREAM) 0.75 % cream Apply 1 application topically 2 (two) times daily.     oxyCODONE (OXY IR/ROXICODONE) 5 MG immediate release tablet Take 1-2 tablets (5-10 mg total) by mouth 2 (two) times daily as needed for severe pain. 120 tablet 0   polyethylene glycol powder (GLYCOLAX/MIRALAX) 17 GM/SCOOP powder Take 17-34 g by mouth 2 (two) times daily as needed for moderate constipation. 500 g 5   senna-docusate (SENOKOT-S) 8.6-50 MG tablet Take 2 tablets by mouth at bedtime.     spironolactone (ALDACTONE) 25 MG tablet TAKE 1 TABLET BY MOUTH EVERY DAY 90 tablet 3   Vitamin D, Ergocalciferol, (DRISDOL) 1.25 MG (50000 UNIT) CAPS capsule TAKE 1 CAPSULE BY MOUTH ONE TIME PER WEEK 12 capsule 3   lenalidomide (REVLIMID) 5 MG capsule Take 1 capsule (5 mg total) by mouth daily. Take 1 capsule (5 mg total) by mouth daily for 21 days then  take 7 days off 21 capsule 0   No current facility-administered medications for this encounter.    Allergies:   Xarelto [rivaroxaban], Corticosteroids, Ramipril, Zolpidem, and Benazepril   Social History:  The patient  reports that he has never smoked. He has never used smokeless tobacco. He reports  that he does not currently use alcohol. He reports that he does not use drugs.   Family History:  The patient's family history includes Colon cancer (age of onset: 45) in his mother; Hypertension in an other family member.   ROS:  Please see the history of present illness.   All other systems are personally reviewed and negative.   Exam:   BP 110/70   Pulse 64   Wt 79.7 kg (175 lb 9.6 oz)   SpO2 97%   BMI 25.20 kg/m  General: NAD Neck: No JVD, no thyromegaly or thyroid nodule.  Lungs: Clear to auscultation bilaterally with normal respiratory effort. CV: Nondisplaced PMI.  Heart regular S1/S2, no S3/S4, 2/6 early SEM RUSB.  No peripheral edema.  No carotid bruit.  Normal pedal pulses.  Abdomen: Soft, nontender, no hepatosplenomegaly, no distention.  Skin: Intact without lesions or rashes.  Neurologic: Alert and oriented x 3.  Psych: Normal affect. Extremities: No clubbing or cyanosis.  HEENT: Normal.   Recent Labs: 05/14/2022: ALT 47; BUN 21; Creatinine 1.16; Hemoglobin 14.5; Platelet Count 120; Potassium 4.3; Sodium 139  Personally reviewed   Wt Readings from Last 3 Encounters:  05/18/22 79.7 kg (175 lb 9.6 oz)  05/14/22 79.9 kg (176 lb 1.6 oz)  02/17/22 79.3 kg (174 lb 14.4 oz)    ASSESSMENT AND PLAN:  1. Atrial fibrillation: Paroxysmal. He is quite symptomatic when in atrial fibrillation.  CHADSVASC = 3.  NSR currently with no recent atrial fibrillation symptoms.  - Continue Eliquis.  - He has not wanted atrial fibrillation ablation.   2. Peripheral neuropathy: Patient developed a very painful peripheral neuropathy on Velcade.  This is still very symptomatic.  3. Bradycardia:  After TAVR, patient had RBBB and LAFB with HR down to the 40s.  RBBB has resolved and HR is higher.  4. PVCs: Zio monitor in 10/20 showed 9.4% PVCs.  Toprol XL was increased to 25 mg bid, he is not feeling palpitations.   5. HTN: Stable.  - Continue spironolactone 25 mg daily.  6. Bicuspid aortic valve disorder: S/P TAVR with 2 Edwards Sapien THVs (valve-in-valve due to peri-valvular leak after 1st valve placed). Post-op, mean gradient across the aortic valve was elevated, 26 mmHg in 9/20.  Dimensionless index, in 9/20 0.54, only suggests mild bioprosthetic aortic stenosis.  Echo in 1/21 showed mean gradient down to 10 mmHg and TEE showed mean gradient 8 mmHg with no peri-valvular leakage.  Echo in 8/22 with mean gradient 12 mmHg.  He has history of dilated ascending aorta as well.  - I will arrange for repeat echo.  - Follow aorta by echo for now, he would likely be a poor candidate for ascending aorta replacement given frailty and age.  7. Chronic diastolic CHF:  He is not volume overloaded on exam.  He does not take Lasix.  8. Multiple myeloma: He continues on Revlimid.     Followup in 6 months.   Signed, [name], MD  05/19/2022  [place] [address] [facility] [place] [address] [phone] (office) [phone] (fax)

## 2022-05-20 ENCOUNTER — Encounter: Payer: Self-pay | Admitting: Hematology

## 2022-05-20 ENCOUNTER — Telehealth: Payer: Self-pay | Admitting: Hematology

## 2022-05-20 NOTE — Telephone Encounter (Signed)
Scheduled follow-up appointment per 9/1 los. Patient is aware.

## 2022-05-20 NOTE — Progress Notes (Signed)
HEMATOLOGY/ONCOLOGY CLINIC NOTE  Date of Service: .05/14/2022    Patient Care Team: Tresa Garter, MD as PCP - General Laurey Morale, MD as PCP - Advanced Heart Failure (Cardiology) Laurey Morale, MD as PCP - Cardiology (Cardiology) Maeola Harman, MD as Attending Physician (Neurosurgery) Donzetta Starch, MD as Consulting Physician (Dermatology) Oretha Milch, MD as Consulting Physician (Pulmonary Disease) Glendale Chard, DO as Consulting Physician (Neurology)   CHIEF COMPLAINTS/PURPOSE OF CONSULTATION:  Follow-up for continued evaluation and management of multiple myeloma  HISTORY OF PRESENTING ILLNESS:  Please see previous notes for details on initial presentation  INTERVAL HISTORY:  [name] [name] Is a 81 y.o. male is here for continued evaluation and management of multiple myeloma. He notes no acute new symptoms since his last clinic visit.  He has been trying to stay more physically active. Notes that his right hip/pelvic pain has improved since radiation. No new focal bone pains. No fevers no chills no night sweats no unexpected weight loss. Some grade 1 cramping from Revlimid but no other acute new toxicities. Labs done today were discussed in detail with him.   MEDICAL HISTORY:  Past Medical History:  Diagnosis Date   Ascending aortic aneurysm (HCC)    Bicuspid aortic valve    Cancer (HCC)    CHF NYHA class I (no symptoms from ordinary activities), acute, diastolic (HCC)    Dysrhythmia 6665   A fib   Fatty liver    mild   Fracture    left proximal humerus   GERD (gastroesophageal reflux disease)    GI bleeding 07/21/2018   post polypectomy   Hemorrhoids    HTN (hypertension)    Hypercholesteremia    Hypokalemia    Internal hemorrhoids    LBP (low back pain)    Moderate aortic stenosis    Osteoarthritis    Paroxysmal atrial fibrillation (HCC)    a. new onset Afib in 07/2008. He underwent ibutilide cardioversion successfully. b. Recurrence  01/2013 s/p TEE/DCCV - was on Xarelto but he stopped it as he was convinced it was causing joint pn. c. Recurrence 01/2016 - spont conv to NSR. Pt took Eliquis x1 mo then declined further anticoag. d. Recurrence 07/2016.   Pneumonia    Tubular adenoma of colon     SURGICAL HISTORY: Past Surgical History:  Procedure Laterality Date   BACK SURGERY  x12 years ago   CARDIAC CATHETERIZATION  2020   CARDIAC VALVE REPLACEMENT  2020   CARDIOVERSION N/A 01/26/2013   Procedure: CARDIOVERSION;  Surgeon: Laurey Morale, MD;  Location: [facility] ENDOSCOPY;  Service: Cardiovascular;  Laterality: N/A;   CARDIOVERSION N/A 10/28/2017   Procedure: CARDIOVERSION;  Surgeon: Laurey Morale, MD;  Location: [facility] ENDOSCOPY;  Service: Cardiovascular;  Laterality: N/A;   CARDIOVERSION N/A 03/03/2018   Procedure: CARDIOVERSION;  Surgeon: Lewayne Bunting, MD;  Location: [facility] ENDOSCOPY;  Service: Cardiovascular;  Laterality: N/A;   CARDIOVERSION N/A 09/19/2019   Procedure: CARDIOVERSION;  Surgeon: Laurey Morale, MD;  Location: [facility] ENDOSCOPY;  Service: Cardiovascular;  Laterality: N/A;   COLONOSCOPY     COLONOSCOPY  07/17/2018   at [facility]   HEMORRHOID SURGERY     INGUINAL HERNIA REPAIR Left 03/18/2020   Procedure: OPEN LEFT INGUINAL HERNIA REPAIR;  Surgeon: Ovidio Kin, MD;  Location: [facility] OR;  Service: General;  Laterality: Left;   LUMBAR LAMINECTOMY     ORIF HUMERUS FRACTURE Left 01/15/2020   Procedure: OPEN REDUCTION INTERNAL FIXATION (ORIF) PROXIMAL HUMERUS FRACTURE;  Surgeon: Yolonda Kida, MD;  Location: [facility] OR;  Service: Orthopedics;  Laterality: Left;   POLYPECTOMY     RIGHT HEART CATH N/A 08/20/2019   Procedure: RIGHT HEART CATH;  Surgeon: Laurey Morale, MD;  Location: [facility] INVASIVE CV LAB;  Service: Cardiovascular;  Laterality: N/A;   RIGHT/LEFT HEART CATH AND CORONARY ANGIOGRAPHY N/A 03/07/2019   Procedure: RIGHT/LEFT HEART CATH AND CORONARY ANGIOGRAPHY;  Surgeon: Kathleene Hazel, MD;  Location: [facility] INVASIVE  CV LAB;  Service: Cardiovascular;  Laterality: N/A;   TAVAR  04/2019   TEE WITHOUT CARDIOVERSION N/A 01/26/2013   Procedure: TRANSESOPHAGEAL ECHOCARDIOGRAM (TEE);  Surgeon: Laurey Morale, MD;  Location: [facility] ENDOSCOPY;  Service: Cardiovascular;  Laterality: N/A;   TEE WITHOUT CARDIOVERSION N/A 10/28/2017   Procedure: TRANSESOPHAGEAL ECHOCARDIOGRAM (TEE);  Surgeon: Laurey Morale, MD;  Location: [facility] ENDOSCOPY;  Service: Cardiovascular;  Laterality: N/A;   TEE WITHOUT CARDIOVERSION N/A 05/08/2019   Procedure: TRANSESOPHAGEAL ECHOCARDIOGRAM (TEE);  Surgeon: Kathleene Hazel, MD;  Location: [facility] INVASIVE CV LAB;  Service: Open Heart Surgery;  Laterality: N/A;   TEE WITHOUT CARDIOVERSION N/A 09/19/2019   Procedure: TRANSESOPHAGEAL ECHOCARDIOGRAM (TEE);  Surgeon: Laurey Morale, MD;  Location: [facility] ENDOSCOPY;  Service: Cardiovascular;  Laterality: N/A;   TRANSCATHETER AORTIC VALVE REPLACEMENT, TRANSFEMORAL N/A 05/08/2019   Procedure: TRANSCATHETER AORTIC VALVE REPLACEMENT, TRANSFEMORAL;  Surgeon: Kathleene Hazel, MD;  Location: [facility] INVASIVE CV LAB;  Service: Open Heart Surgery;  Laterality: N/A;    SOCIAL HISTORY: Social History   Socioeconomic History   Marital status: Married    Spouse name: Not on file   Number of children: 0   Years of education: Not on file   Highest education level: Not on file  Occupational History   Occupation: Retired Garment/textile technologist: [employer]  Tobacco Use   Smoking status: Never   Smokeless tobacco: Never  Vaping Use   Vaping Use: Never used  Substance and Sexual Activity   Alcohol use: Not Currently   Drug use: No   Sexual activity: Yes  Other Topics Concern   Not on file  Social History Narrative   Patient lives in [place] w/ his wife. He is a native of [place]. 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   Right-handed   Caffeine: 2 cups coffee per day   Two story home   Social Determinants  of Health   Financial Resource Strain: Not on file  Food Insecurity: Not on file  Transportation Needs: Not on file  Physical Activity: Not on file  Stress: Not on file  Social Connections: Not on file  Intimate Partner Violence: Not on file    FAMILY HISTORY: Family History  Problem Relation Age of Onset   Colon cancer Mother 52   Hypertension Other    Coronary artery disease Neg Hx    Colon polyps Neg Hx    Esophageal cancer Neg Hx    Rectal cancer Neg Hx    Stomach cancer Neg Hx     ALLERGIES:  is allergic to xarelto [rivaroxaban], corticosteroids, ramipril, zolpidem, and benazepril.  MEDICATIONS:  Current Outpatient Medications  Medication Sig Dispense Refill   b complex vitamins tablet Take 1 tablet by mouth daily. 100 tablet 3   Calcium-Magnesium 500-250 MG TABS Take 1 tablet by mouth daily.     Carboxymethylcellul-Glycerin (LUBRICATING EYE DROPS OP) Place 1 drop into both eyes daily as needed (dry eyes).  Cholecalciferol (VITAMIN D) 50 MCG (2000 UT) tablet Take 2,000 Units by mouth daily.     diclofenac Sodium (VOLTAREN) 1 % GEL Apply 1 application topically 2 (two) times daily as needed (pain.).     ELIQUIS 5 MG TABS tablet TAKE 1 TABLET BY MOUTH TWICE A DAY 60 tablet 11   gabapentin (NEURONTIN) 300 MG capsule Take 1 capsule (300 mg total) by mouth at bedtime. 90 capsule 3   ipratropium (ATROVENT) 0.06 % nasal spray Place 2 sprays into the nose 3 (three) times daily. 15 mL 2   lenalidomide (REVLIMID) 5 MG capsule Take 1 capsule (5 mg total) by mouth daily. Take 1 capsule (5 mg total) by mouth daily for 21 days then take 7 days off 21 capsule 0   metoprolol succinate (TOPROL-XL) 25 MG 24 hr tablet TAKE 1 TABLET (25 MG TOTAL) BY MOUTH 2 (TWO) TIMES DAILY. NEEDS FOLLOW UP FOR MORE REFILLS 180 tablet 1   metroNIDAZOLE (METROCREAM) 0.75 % cream Apply 1 application topically 2 (two) times daily.     oxyCODONE (OXY IR/ROXICODONE) 5 MG immediate release tablet Take 1-2  tablets (5-10 mg total) by mouth 2 (two) times daily as needed for severe pain. 120 tablet 0   polyethylene glycol powder (GLYCOLAX/MIRALAX) 17 GM/SCOOP powder Take 17-34 g by mouth 2 (two) times daily as needed for moderate constipation. 500 g 5   senna-docusate (SENOKOT-S) 8.6-50 MG tablet Take 2 tablets by mouth at bedtime.     spironolactone (ALDACTONE) 25 MG tablet TAKE 1 TABLET BY MOUTH EVERY DAY 90 tablet 3   Vitamin D, Ergocalciferol, (DRISDOL) 1.25 MG (50000 UNIT) CAPS capsule TAKE 1 CAPSULE BY MOUTH ONE TIME PER WEEK 12 capsule 3   No current facility-administered medications for this visit.    REVIEW OF SYSTEMS:   .10 Point review of Systems was done is negative except as noted above. How is PHYSICAL EXAMINATION: Vitals:   05/14/22 1405  BP: 134/74  Pulse: (!) 51  Resp: 16  Temp: 98.3 F (36.8 C)  SpO2: 99%    Wt Readings from Last 3 Encounters:  05/18/22 175 lb 9.6 oz (79.7 kg)  05/14/22 176 lb 1.6 oz (79.9 kg)  02/17/22 174 lb 14.4 oz (79.3 kg)   Body mass index is 25.27 kg/m.    ECOG FS:2 - Symptomatic, <50% confined to bed . GENERAL:alert, in no acute distress and comfortable SKIN: no acute rashes, no significant lesions EYES: conjunctiva are pink and non-injected, sclera anicteric OROPHARYNX: MMM, no exudates, no oropharyngeal erythema or ulceration NECK: supple, no JVD LYMPH:  no palpable lymphadenopathy in the cervical, axillary or inguinal regions LUNGS: clear to auscultation b/l with normal respiratory effort HEART: regular rate & rhythm ABDOMEN:  normoactive bowel sounds , non tender, not distended. Extremity: no pedal edema PSYCH: alert & oriented x 3 with fluent speech NEURO: no focal motor/sensory deficits    Exam performed in chair.  LABORATORY DATA:  I have reviewed the data as listed  .    Latest Ref Rng & Units 05/14/2022    1:37 PM 02/12/2022   12:13 PM 01/06/2022    1:36 PM  CBC  WBC 4.0 - 10.5 K/uL 5.0  4.3  4.6   Hemoglobin 13.0  - 17.0 g/dL 14.5  14.8  13.6   Hematocrit 39.0 - 52.0 % 43.7  44.1  40.8   Platelets 150 - 400 K/uL 120  155  125     .    Latest Ref Rng &  Units 05/14/2022    1:37 PM 02/12/2022   12:13 PM 01/06/2022    1:36 PM  CMP  Glucose 70 - 99 mg/dL 99  119  92   BUN 8 - 23 mg/dL $Remove'21  20  21   'VuqcBwT$ Creatinine 0.61 - 1.24 mg/dL 1.16  1.05  1.13   Sodium 135 - 145 mmol/L 139  142  140   Potassium 3.5 - 5.1 mmol/L 4.3  4.3  4.1   Chloride 98 - 111 mmol/L 105  108  108   CO2 22 - 32 mmol/L $RemoveB'29  27  25   'wJCORAhI$ Calcium 8.9 - 10.3 mg/dL 9.4  9.6  9.2   Total Protein 6.5 - 8.1 g/dL 6.4  6.9  6.2   Total Bilirubin 0.3 - 1.2 mg/dL 0.7  0.5  0.6   Alkaline Phos 38 - 126 U/L 61  62  55   AST 15 - 41 U/L 34  34  29   ALT 0 - 44 U/L 47  41  26          05/24/2019 Bone Marrow Biopsy    04/16/2019 Surgical Pathology:   Surgical Pathology  CASE: [id number]  PATIENT: [facility]  Bone Marrow Report      Clinical History: Multiple myeloma, remission status unspecified ([place])  (BH)      DIAGNOSIS:   BONE MARROW, ASPIRATE, CLOT, CORE:  -Variably cellular bone marrow with trilineage hematopoiesis and 2%  plasma cells  -See comment   PERIPHERAL BLOOD:  -Slight thrombocytopenia   COMMENT:   The bone marrow is variably cellular with trilineage hematopoiesis  including abundant megakaryocytes in the more cellular areas.  In this  background, the plasma cells represent 2% of all cells in the aspirate  with interstitial cells and a few very minute clusters in the  clot/biopsy sections.  The latter in particular display kappa light  chain excess/restriction most suggestive of minimal residual plasma cell  neoplasm despite limited findings.  Correlation with cytogenetic and  FISH studies is recommended.    RADIOGRAPHIC STUDIES: I have personally reviewed the radiological images as listed and agreed with the findings in the report. No results found.  ASSESSMENT & PLAN:   81 yo   #1 Plasma  cell myeloma-currently in relapse  03/28/2019 MRI pelvis w/wo contrast revealed "1. Destructive bone lesions as detailed above. Findings most consistent with metastatic disease. PET-CT may be helpful for further evaluation and to establish a primary tumor. The right pelvic bone lesions should be amenable to image guided biopsy but a PET scan may demonstrate easier/safer biopsy sites. 2. No intrapelvic mass or adenopathy. 3. Benign intraosseous lipoma involving the left anterior superior acetabulum."  04/06/2019 PET whole body revealed "1. Diffuse osseous metastatic disease as detailed above without findings for a primary neoplasm in the chest, abdomen or pelvis. The large destructive lesion involving the right ischium should be amenable to image guided biopsy. 2. Two small retroperitoneal lymph nodes and 1 small right obturator node showing hypermetabolism."   04/16/2019 Posterior right pelvis bone biopsy revealed "PLASMA CELL NEOPLASM"  05/24/2019 Bone Marrow Biopsy revealed "BONE MARROW: - CELLULAR MARROW WITH INVOLVEMENT BY PLASMA CELL NEOPLASM (20%) PERIPHERAL BLOOD: - MORPHOLOGICALLY UNREMARKABLE"  05/24/2019 FISH Panel revealed "ABNORMAL result with 11q+, 14q+ and +17"  #2 Severe aortic stenosis with bicuspid aortic valve -10/26/2018 ECHO revealed AVA at 0.8 cm2 and LV EF of 60-65% -05/08/2019 pt had a Transfemoral Transcatheter Aortic Valve Replacement  PET CT scan from 2/2  which shows There is persistent low level radiotracer uptake associated with the large lucent lesion within the superior right acetabulum. Additionally, there is a new focal area of increased radiotracer uptake above the background low level activity within this lesion with SUV max of 3.92. On the previous exam there was relatively homogeneous low level uptake within this lesion within SUV max of 2.43. Imaging findings are concerning for residual metabolically active tumor. Patient has seen radiation oncology since his  last visit and they have decided to proceed with involved site radiation therapy to the FDG avid lesion to reduce the risk of fracture.  -He had his bone marrow biopsy on 11/03/2021 which shows 2% abnormal plasma cells.  PLAN: -Labs from 05/14/2022 were discussed with the patient in detail . CBC and CMP stable. CBC and CMP unremarkable.  Mild thrombocytopenia with platelets of 120k Panel shows stable M spike of 0.2 g/dL Status post completion of involved site radiation therapy to the right hip with significant improvement in pain control. Patient has no new symptoms suggestive of myeloma progression at this time. -Grade 1 cramping and grade 1 fatigue from his Revlimid but no other notable toxicities -Continue Revlimid 5 mg p.o. daily 3 weeks on 1 week off..  Discussed increasing the dose to 10 mg but patient wants to hold off at this time. -Continue Eliquis which she is already on for his atrial fibrillation but also for VTE prophylaxis. -Continue Gabapentin 600 mg p.o. at bedtime.  -Continue Oxycodone prn for neuropathy pain.  Has not needed this much. -Continue Zometa q25months. -He was advised to continue to maintain hydration by drinking at least 48-64 oz per day. -He continues to take vitamin D 2000 units daily with 50k units 1x p.o weekly on Friday. -RTC in 2 months with labs.  FOLLOW UP: RTC with Dr Irene Limbo with labs and next dose of Zometa in 2 months  The total time spent in the appointment was 23 minutes*.  All of the patient's questions were answered with apparent satisfaction. The patient knows to call the clinic with any problems, questions or concerns.   Sullivan Lone MD MS AAHIVMS [facility] Hematology/Oncology Physician [facility]  .*Total Encounter Time as defined by the Centers for Medicare and Medicaid Services includes, in addition to the face-to-face time of a patient visit (documented in the note above) non-face-to-face time: obtaining and reviewing outside  history, ordering and reviewing medications, tests or procedures, care coordination (communications with other health care professionals or caregivers) and documentation in the medical record.

## 2022-05-31 ENCOUNTER — Ambulatory Visit (HOSPITAL_COMMUNITY)
Admission: RE | Admit: 2022-05-31 | Discharge: 2022-05-31 | Disposition: A | Discharge status: Home / Self Care | Payer: Medicare Other | Source: Ambulatory Visit | Attending: Internal Medicine | Admitting: Internal Medicine

## 2022-05-31 ENCOUNTER — Other Ambulatory Visit: Payer: Self-pay

## 2022-05-31 DIAGNOSIS — I110 Hypertensive heart disease with heart failure: Secondary | ICD-10-CM | POA: Insufficient documentation

## 2022-05-31 DIAGNOSIS — E785 Hyperlipidemia, unspecified: Secondary | ICD-10-CM | POA: Diagnosis not present

## 2022-05-31 DIAGNOSIS — I509 Heart failure, unspecified: Secondary | ICD-10-CM | POA: Insufficient documentation

## 2022-05-31 DIAGNOSIS — Z952 Presence of prosthetic heart valve: Secondary | ICD-10-CM

## 2022-05-31 DIAGNOSIS — I2510 Atherosclerotic heart disease of native coronary artery without angina pectoris: Secondary | ICD-10-CM | POA: Diagnosis not present

## 2022-05-31 DIAGNOSIS — I4891 Unspecified atrial fibrillation: Secondary | ICD-10-CM | POA: Insufficient documentation

## 2022-05-31 DIAGNOSIS — R9430 Abnormal result of cardiovascular function study, unspecified: Secondary | ICD-10-CM

## 2022-05-31 DIAGNOSIS — I5032 Chronic diastolic (congestive) heart failure: Secondary | ICD-10-CM | POA: Diagnosis not present

## 2022-05-31 LAB — ECHOCARDIOGRAM COMPLETE
AR max vel: 2.34 cm2
AV Area VTI: 2.58 cm2
AV Area mean vel: 2.02 cm2
AV Mean grad: 19 mmHg
AV Peak grad: 31.4 mmHg
Ao pk vel: 2.8 m/s
Area-P 1/2: 1.66 cm2
Calc EF: 67.8 %
S' Lateral: 3.1 cm
Single Plane A2C EF: 68.8 %
Single Plane A4C EF: 66.1 %

## 2022-06-03 ENCOUNTER — Ambulatory Visit (INDEPENDENT_AMBULATORY_CARE_PROVIDER_SITE_OTHER): Payer: Medicare Other | Admitting: Internal Medicine

## 2022-06-03 ENCOUNTER — Encounter: Payer: Self-pay | Admitting: Internal Medicine

## 2022-06-03 VITALS — BP 132/72 | HR 53 | Temp 98.3°F | Ht 70.0 in | Wt 178.8 lb

## 2022-06-03 DIAGNOSIS — Z23 Encounter for immunization: Secondary | ICD-10-CM

## 2022-06-03 DIAGNOSIS — I480 Paroxysmal atrial fibrillation: Secondary | ICD-10-CM

## 2022-06-03 DIAGNOSIS — C9000 Multiple myeloma not having achieved remission: Secondary | ICD-10-CM

## 2022-06-03 DIAGNOSIS — G622 Polyneuropathy due to other toxic agents: Secondary | ICD-10-CM | POA: Diagnosis not present

## 2022-06-03 DIAGNOSIS — Z952 Presence of prosthetic heart valve: Secondary | ICD-10-CM

## 2022-06-03 MED ORDER — OXYCODONE HCL 5 MG PO TABS: 5.0000 mg | ORAL_TABLET | Freq: Two times a day (BID) | ORAL | 0 refills | Status: DC | PRN

## 2022-06-03 NOTE — Assessment & Plan Note (Signed)
He just had an ECHO CT pending

## 2022-06-03 NOTE — Assessment & Plan Note (Addendum)
Cont on Revlimid. F/u and labs w/Dr. Irene Limbo

## 2022-06-03 NOTE — Assessment & Plan Note (Addendum)
Oxycodone prn, Gabapentin for chronic pain in the legs, and feet Rx given  Potential benefits of a long term opioids use as well as potential risks (i.e. addiction risk, apnea etc) and complications (i.e. Somnolence, constipation and others) were explained to the patient and were aknowledged. TENS - declined Blue-Emu cream was recommended to use 2-3 times a day

## 2022-06-03 NOTE — Progress Notes (Signed)
Subjective:  [name] ID: [name] [name], male    DOB: October 21, 1940  Age: 81 y.o. MRN: [id number]  CC: Follow-up (4 month f/u- Flu shot)   HPI [name] [name] presents for MM, A fib, CHF, chronic pain  Outpatient Medications Prior to Visit  Medication Sig Dispense Refill   b complex vitamins tablet Take 1 tablet by mouth daily. 100 tablet 3   Calcium-Magnesium 500-250 MG TABS Take 1 tablet by mouth daily.     Carboxymethylcellul-Glycerin (LUBRICATING EYE DROPS OP) Place 1 drop into both eyes daily as needed (dry eyes).     Cholecalciferol (VITAMIN D) 50 MCG (2000 UT) tablet Take 2,000 Units by mouth daily.     diclofenac Sodium (VOLTAREN) 1 % GEL Apply 1 application topically 2 (two) times daily as needed (pain.).     ELIQUIS 5 MG TABS tablet TAKE 1 TABLET BY MOUTH TWICE A DAY 60 tablet 11   gabapentin (NEURONTIN) 300 MG capsule Take 1 capsule (300 mg total) by mouth at bedtime. 90 capsule 3   ipratropium (ATROVENT) 0.06 % nasal spray Place 2 sprays into the nose 3 (three) times daily. 15 mL 2   lenalidomide (REVLIMID) 5 MG capsule Take 1 capsule (5 mg total) by mouth daily. Take 1 capsule (5 mg total) by mouth daily for 21 days then take 7 days off 21 capsule 0   metoprolol succinate (TOPROL-XL) 25 MG 24 hr tablet TAKE 1 TABLET (25 MG TOTAL) BY MOUTH 2 (TWO) TIMES DAILY. NEEDS FOLLOW UP FOR MORE REFILLS 180 tablet 1   metroNIDAZOLE (METROCREAM) 0.75 % cream Apply 1 application topically 2 (two) times daily.     polyethylene glycol powder (GLYCOLAX/MIRALAX) 17 GM/SCOOP powder Take 17-34 [name] by mouth 2 (two) times daily as needed for moderate constipation. 500 [name] 5   senna-docusate (SENOKOT-S) 8.6-50 MG tablet Take 2 tablets by mouth at bedtime.     spironolactone (ALDACTONE) 25 MG tablet TAKE 1 TABLET BY MOUTH EVERY DAY 90 tablet 3   Vitamin D, Ergocalciferol, (DRISDOL) 1.25 MG (50000 UNIT) CAPS capsule TAKE 1 CAPSULE BY MOUTH ONE TIME PER WEEK 12 capsule 3   oxyCODONE (OXY IR/ROXICODONE) 5 MG  immediate release tablet Take 1-2 tablets (5-10 mg total) by mouth 2 (two) times daily as needed for severe pain. 120 tablet 0   No facility-administered medications prior to visit.    ROS: Review of Systems  Constitutional:  Positive for fatigue. Negative for appetite change and unexpected weight change.  HENT:  Negative for congestion, nosebleeds, sneezing, sore throat and trouble swallowing.   Eyes:  Negative for itching and visual disturbance.  Respiratory:  Positive for shortness of breath. Negative for cough.   Cardiovascular:  Negative for chest pain, palpitations and leg swelling.  Gastrointestinal:  Negative for abdominal distention, blood in stool, diarrhea and nausea.  Genitourinary:  Negative for frequency and hematuria.  Musculoskeletal:  Positive for arthralgias, back pain and gait problem. Negative for joint swelling and neck pain.  Skin:  Negative for rash.  Neurological:  Positive for weakness and numbness. Negative for dizziness, tremors and speech difficulty.  Psychiatric/Behavioral:  Negative for agitation, dysphoric mood, sleep disturbance and suicidal ideas. The [name] is not nervous/anxious.     Objective:  BP 132/72 (BP Location: Left Arm)   Pulse (!) 53   Temp 98.3 F (36.8 C) (Oral)   Ht _0  (1.778 m)   Wt 178 lb 12.8 oz (81.1 kg)   SpO2 98%   BMI 25.66 kg/m  BP Readings from Last 3 Encounters:  06/03/22 132/72  05/18/22 110/70  05/14/22 134/74    Wt Readings from Last 3 Encounters:  06/03/22 178 lb 12.8 oz (81.1 kg)  05/18/22 175 lb 9.6 oz (79.7 kg)  05/14/22 176 lb 1.6 oz (79.9 kg)    Physical Exam Constitutional:      General: He is not in acute distress.    Appearance: Normal appearance. He is well-developed.     Comments: NAD  Eyes:     Conjunctiva/sclera: Conjunctivae normal.     Pupils: Pupils are equal, round, and reactive to light.  Neck:     Thyroid: No thyromegaly.     Vascular: No JVD.  Cardiovascular:     Rate and  Rhythm: Normal rate and regular rhythm.     Heart sounds: Normal heart sounds. No murmur heard.    No friction rub. No gallop.  Pulmonary:     Effort: Pulmonary effort is normal. No respiratory distress.     Breath sounds: Normal breath sounds. No wheezing or rales.  Chest:     Chest wall: No tenderness.  Abdominal:     General: Bowel sounds are normal. There is no distension.     Palpations: Abdomen is soft. There is no mass.     Tenderness: There is no abdominal tenderness. There is no guarding or rebound.  Musculoskeletal:        General: No tenderness. Normal range of motion.     Cervical back: Normal range of motion.  Lymphadenopathy:     Cervical: No cervical adenopathy.  Skin:    General: Skin is warm and dry.     Findings: No rash.  Neurological:     Mental Status: He is alert and oriented to person, place, and time.     Cranial Nerves: No cranial nerve deficit.     Motor: No abnormal muscle tone.     Coordination: Coordination normal.     Gait: Gait normal.     Deep Tendon Reflexes: Reflexes are normal and symmetric.  Psychiatric:        Behavior: Behavior normal.        Thought Content: Thought content normal.        Judgment: Judgment normal.   Ataxic a little  Lab Results  Component Value Date   WBC 5.0 05/14/2022   HGB 14.5 05/14/2022   HCT 43.7 05/14/2022   PLT 120 (L) 05/14/2022   GLUCOSE 99 05/14/2022   CHOL 144 08/01/2018   TRIG 167.0 (H) 08/01/2018   HDL 28.20 (L) 08/01/2018   LDLDIRECT 151.5 11/20/2007   LDLCALC 82 08/01/2018   ALT 47 (H) 05/14/2022   AST 34 05/14/2022   NA 139 05/14/2022   K 4.3 05/14/2022   CL 105 05/14/2022   CREATININE 1.16 05/14/2022   BUN 21 05/14/2022   CO2 29 05/14/2022   TSH 5.338 (H) 06/23/2020   PSA 4.20 (H) 08/01/2018   INR 1.2 01/07/2020   HGBA1C 5.8 (H) 05/04/2019    ECHOCARDIOGRAM COMPLETE  Result Date: 05/31/2022    ECHOCARDIOGRAM REPORT   [name] Name:   [name] [name] Date of Exam: 05/31/2022 Medical Rec  #:  [id number]     Height:       70.0 in Accession #:    [id number]    Weight:       175.6 lb Date of Birth:  [birth year]     BSA:          1.975 m [name] Age:  81 years      BP:           110/70 mmHg [name] Gender: M             HR:           51 bpm. Exam Location:  Outpatient Procedure: 2D Echo, Cardiac Doppler and Color Doppler Indications:    CHF  History:        [name] has prior history of Echocardiogram examinations, most                 recent 05/08/2021. CHF, CAD, Arrythmias:Atrial Fibrillation; Risk                 Factors:Dyslipidemia and Hypertension.                 Aortic Valve: 26 mm Sapien prosthetic, stented (TAVR) valve is                 present in the aortic position. Procedure Date: 04/2019.  Sonographer:    Bernadene Person RDCS Referring Phys: [place]  1. 26 mm S3 TAVR. Vmax 2.8 m/s, MG 19 mmHG, EOA 2.59 cm2, DI 0.64. Gradient has increased from 12 mmHG to 19 mmHG. Would recommend gated cardiac CT to further interrogate the prosthesis. The aortic valve has been repaired/replaced. Aortic valve regurgitation is not visualized. There is a 26 mm Sapien prosthetic (TAVR) valve present in the aortic position. Procedure Date: 04/2019.  2. Left ventricular ejection fraction, by estimation, is 65 to 70%. The left ventricle has normal function. The left ventricle has no regional wall motion abnormalities. Left ventricular diastolic parameters are consistent with Grade II diastolic dysfunction (pseudonormalization).  3. Right ventricular systolic function is normal. The right ventricular size is normal. There is normal pulmonary artery systolic pressure. The estimated right ventricular systolic pressure is 09.3 mmHg.  4. The mitral valve is grossly normal. Mild mitral valve regurgitation. No evidence of mitral stenosis.  5. The inferior vena cava is dilated in size with >50% respiratory variability, suggesting right atrial pressure of 8 mmHg. Comparison(s): Changes from prior study  are noted. FINDINGS  Left Ventricle: Left ventricular ejection fraction, by estimation, is 65 to 70%. The left ventricle has normal function. The left ventricle has no regional wall motion abnormalities. The left ventricular internal cavity size was normal in size. There is  no left ventricular hypertrophy. Left ventricular diastolic parameters are consistent with Grade II diastolic dysfunction (pseudonormalization). Right Ventricle: The right ventricular size is normal. No increase in right ventricular wall thickness. Right ventricular systolic function is normal. There is normal pulmonary artery systolic pressure. The tricuspid regurgitant velocity is 2.56 m/s, and  with an assumed right atrial pressure of 8 mmHg, the estimated right ventricular systolic pressure is 26.7 mmHg. Left Atrium: Left atrial size was normal in size. Right Atrium: Right atrial size was normal in size. Pericardium: There is no evidence of pericardial effusion. Mitral Valve: The mitral valve is grossly normal. Mild mitral valve regurgitation. No evidence of mitral valve stenosis. Tricuspid Valve: The tricuspid valve is grossly normal. Tricuspid valve regurgitation is trivial. No evidence of tricuspid stenosis. Aortic Valve: 26 mm S3 TAVR. Vmax 2.8 m/s, MG 19 mmHG, EOA 2.59 cm2, DI 0.64. Gradient has increased from 12 mmHG to 19 mmHG. Would recommend gated cardiac CT to further interrogate the prosthesis. The aortic valve has been repaired/replaced. Aortic valve regurgitation is not visualized. Aortic valve mean gradient measures 19.0 mmHg. Aortic  valve peak gradient measures 31.4 mmHg. Aortic valve area, by VTI measures 2.58 cm. There is a 26 mm Sapien prosthetic, stented (TAVR) valve present in the aortic position. Procedure Date: 04/2019. Pulmonic Valve: The pulmonic valve was grossly normal. Pulmonic valve regurgitation is trivial. No evidence of pulmonic stenosis. Aorta: The aortic root is normal in size and structure. Venous: The  inferior vena cava is dilated in size with greater than 50% respiratory variability, suggesting right atrial pressure of 8 mmHg. IAS/Shunts: The atrial septum is grossly normal.  LEFT VENTRICLE PLAX 2D LVIDd:         4.70 cm     Diastology LVIDs:         3.10 cm     LV e' medial:    5.22 cm/s LV PW:         1.00 cm     LV E/e' medial:  17.5 LV IVS:        0.80 cm     LV e' lateral:   6.20 cm/s LVOT diam:     2.26 cm     LV E/e' lateral: 14.8 LV SV:         150 LV SV Index:   76 LVOT Area:     4.01 cm  LV Volumes (MOD) LV vol d, MOD A2C: 76.9 ml LV vol d, MOD A4C: 80.8 ml LV vol s, MOD A2C: 24.0 ml LV vol s, MOD A4C: 27.4 ml LV SV MOD A2C:     52.9 ml LV SV MOD A4C:     80.8 ml LV SV MOD BP:      54.9 ml RIGHT VENTRICLE RV S prime:     11.30 cm/s TAPSE (M-mode): 2.8 cm LEFT ATRIUM             Index        RIGHT ATRIUM           Index LA diam:        3.30 cm 1.67 cm/m   RA Area:     18.90 cm LA Vol (A2C):   73.6 ml 37.27 ml/m  RA Volume:   49.20 ml  24.91 ml/m LA Vol (A4C):   39.7 ml 20.10 ml/m LA Biplane Vol: 58.4 ml 29.57 ml/m  AORTIC VALVE AV Area (Vmax):    2.34 cm AV Area (Vmean):   2.02 cm AV Area (VTI):     2.58 cm AV Vmax:           280.00 cm/s AV Vmean:          206.667 cm/s AV VTI:            0.581 m AV Peak Grad:      31.4 mmHg AV Mean Grad:      19.0 mmHg LVOT Vmax:         163.00 cm/s LVOT Vmean:        104.000 cm/s LVOT VTI:          0.374 m LVOT/AV VTI ratio: 0.64  AORTA Ao Root diam: 2.50 cm Ao Asc diam:  4.00 cm MITRAL VALVE               TRICUSPID VALVE MV Area (PHT): 1.66 cm    TR Peak grad:   26.2 mmHg MV Decel Time: 457 msec    TR Vmax:        256.00 cm/s MV E velocity: 91.50 cm/s MV A velocity: 52.80 cm/s  SHUNTS MV E/A ratio:  1.73  Systemic VTI:  0.37 m                            Systemic Diam: 2.26 cm Eleonore Chiquito MD Electronically signed by Eleonore Chiquito MD Signature Date/Time: 05/31/2022/12:39:07 PM    Final     Assessment & Plan:   Problem List Items Addressed This  Visit     Multiple myeloma ([place])    Cont on Revlimid. F/u and labs w/Dr. Irene Limbo      Relevant Medications   oxyCODONE (OXY IR/ROXICODONE) 5 MG immediate release tablet   Multiple myeloma not having achieved remission (HCC)   Relevant Medications   oxyCODONE (OXY IR/ROXICODONE) 5 MG immediate release tablet   PAF (paroxysmal atrial fibrillation) (HCC)    Cont on Eliquis      Peripheral neuropathy    Oxycodone prn, Gabapentin for chronic pain in the legs, and feet Rx given  Potential benefits of a long term opioids use as well as potential risks (i.e. addiction risk, apnea etc) and complications (i.e. Somnolence, constipation and others) were explained to the [name] and were aknowledged. TENS - declined Blue-Emu cream was recommended to use 2-3 times a day      S/P TAVR (transcatheter aortic valve replacement)    He just had an ECHO CT pending      Other Visit Diagnoses     Needs flu shot    -  Primary   Relevant Orders   Flu Vaccine QUAD High Dose(Fluad) (Completed)         Meds ordered this encounter  Medications   oxyCODONE (OXY IR/ROXICODONE) 5 MG immediate release tablet    Sig: Take 1-2 tablets (5-10 mg total) by mouth 2 (two) times daily as needed for severe pain.    Dispense:  120 tablet    Refill:  0      Follow-up: Return in about 3 months (around 09/02/2022) for a follow-up visit.  [name], MD

## 2022-06-03 NOTE — Assessment & Plan Note (Signed)
Cont on Eliquis- 

## 2022-06-09 ENCOUNTER — Telehealth (HOSPITAL_COMMUNITY): Payer: Self-pay | Admitting: *Deleted

## 2022-06-09 NOTE — Telephone Encounter (Signed)
Reaching out to patient to offer assistance regarding upcoming cardiac imaging study; pt verbalizes understanding of appt date/time, parking situation and where to check in, and verified current allergies; name and call back number provided for further questions should they arise  Gordy Clement RN Navigator Cardiac [place] [facility] [phone] office [phone] cell  Patient to take his daily medications and is aware to arrive at 2:30pm.

## 2022-06-10 ENCOUNTER — Ambulatory Visit (HOSPITAL_COMMUNITY)
Admission: RE | Admit: 2022-06-10 | Discharge: 2022-06-10 | Disposition: A | Discharge status: Home / Self Care | Payer: Medicare Other | Source: Ambulatory Visit | Attending: Cardiology | Admitting: Cardiology

## 2022-06-10 DIAGNOSIS — Z952 Presence of prosthetic heart valve: Secondary | ICD-10-CM | POA: Insufficient documentation

## 2022-06-10 DIAGNOSIS — R9430 Abnormal result of cardiovascular function study, unspecified: Secondary | ICD-10-CM | POA: Diagnosis not present

## 2022-06-10 MED ORDER — IOHEXOL 350 MG/ML SOLN
100.0000 mL | Freq: Once | INTRAVENOUS | Status: AC | PRN
  Administered 2022-06-10: 100 mL via INTRAVENOUS

## 2022-06-11 NOTE — Progress Notes (Signed)
Patient will need referral sent to local [facility] Clinic to discuss transition from Eliquis to warfarin.

## 2022-06-15 ENCOUNTER — Other Ambulatory Visit: Payer: Self-pay

## 2022-06-15 DIAGNOSIS — C9000 Multiple myeloma not having achieved remission: Secondary | ICD-10-CM

## 2022-06-15 MED ORDER — LENALIDOMIDE 5 MG PO CAPS: 5.0000 mg | ORAL_CAPSULE | Freq: Every day | ORAL | 0 refills | Status: DC

## 2022-06-17 ENCOUNTER — Telehealth: Payer: Self-pay | Admitting: *Deleted

## 2022-06-17 NOTE — Telephone Encounter (Signed)
-----   Message from Jerl Mina, RN sent at 06/17/2022  3:41 PM EDT ----- Needs to switch from Eliquis to warfarin please

## 2022-06-17 NOTE — Telephone Encounter (Signed)
Called pt in reference to the pt needing to switch from Eliquis to Warfarin. There was no answer so left a message to call back at [phone]

## 2022-06-18 ENCOUNTER — Telehealth (HOSPITAL_COMMUNITY): Payer: Self-pay

## 2022-06-18 NOTE — Telephone Encounter (Signed)
Patient called to make appointment to see Dr. Aundra Dubin to discuss changing medication from Eliquis to Warfarin

## 2022-06-18 NOTE — Telephone Encounter (Signed)
I spoke to patient and his wife who indicated that they have no interest switching to Warfarin from Eliquis, because of its' restrictions.    They will call Dr Claris Gladden office to further discuss.

## 2022-06-18 NOTE — Telephone Encounter (Signed)
Sent Emer Colleran, RN a message making her aware pt did not wish to switch to Warfarin.  Emer, RN received message and stated her office will reach out to patient to discuss switching and if he agrees, they will make Anticoagulation clinic aware.

## 2022-06-21 ENCOUNTER — Ambulatory Visit (HOSPITAL_COMMUNITY)
Admission: RE | Admit: 2022-06-21 | Discharge: 2022-06-21 | Disposition: A | Discharge status: Home / Self Care | Payer: Medicare Other | Source: Ambulatory Visit | Attending: Cardiology | Admitting: Cardiology

## 2022-06-21 VITALS — BP 130/70 | HR 56 | Wt 177.0 lb

## 2022-06-21 DIAGNOSIS — Z953 Presence of xenogenic heart valve: Secondary | ICD-10-CM | POA: Insufficient documentation

## 2022-06-21 DIAGNOSIS — Z952 Presence of prosthetic heart valve: Secondary | ICD-10-CM | POA: Diagnosis not present

## 2022-06-21 DIAGNOSIS — I5032 Chronic diastolic (congestive) heart failure: Secondary | ICD-10-CM | POA: Diagnosis not present

## 2022-06-21 DIAGNOSIS — Z7901 Long term (current) use of anticoagulants: Secondary | ICD-10-CM | POA: Diagnosis not present

## 2022-06-21 DIAGNOSIS — I7121 Aneurysm of the ascending aorta, without rupture: Secondary | ICD-10-CM | POA: Insufficient documentation

## 2022-06-21 DIAGNOSIS — Q231 Congenital insufficiency of aortic valve: Secondary | ICD-10-CM | POA: Diagnosis not present

## 2022-06-21 DIAGNOSIS — G629 Polyneuropathy, unspecified: Secondary | ICD-10-CM | POA: Diagnosis not present

## 2022-06-21 DIAGNOSIS — I480 Paroxysmal atrial fibrillation: Secondary | ICD-10-CM | POA: Insufficient documentation

## 2022-06-21 DIAGNOSIS — C9000 Multiple myeloma not having achieved remission: Secondary | ICD-10-CM | POA: Insufficient documentation

## 2022-06-21 DIAGNOSIS — I110 Hypertensive heart disease with heart failure: Secondary | ICD-10-CM | POA: Diagnosis not present

## 2022-06-21 DIAGNOSIS — Z79899 Other long term (current) drug therapy: Secondary | ICD-10-CM | POA: Insufficient documentation

## 2022-06-21 DIAGNOSIS — Z7982 Long term (current) use of aspirin: Secondary | ICD-10-CM | POA: Diagnosis not present

## 2022-06-21 DIAGNOSIS — Z7961 Long term (current) use of immunomodulator: Secondary | ICD-10-CM | POA: Diagnosis not present

## 2022-06-21 MED ORDER — ASPIRIN 81 MG PO CHEW: 81.0000 mg | CHEWABLE_TABLET | Freq: Every day | ORAL | 1 refills | Status: DC

## 2022-06-21 NOTE — Patient Instructions (Signed)
START Baby Asa 81 mg daily  Continue all medications as prescribed   Your physician recommends that you schedule a follow-up appointment in: 6 months (April 2024 )Call in February too schedule an appointment  If you have any questions or concerns before your next appointment please send [place] a message through [place] or call our office at [phone].    TO LEAVE A MESSAGE FOR THE NURSE SELECT OPTION 2, PLEASE LEAVE A MESSAGE INCLUDING: YOUR NAME DATE OF BIRTH CALL BACK NUMBER REASON FOR CALL**this is important as we prioritize the call backs  YOU WILL RECEIVE A CALL BACK THE SAME DAY AS LONG AS YOU CALL BEFORE 4:00 PM  At the [facility], you and your health needs are our priority. As part of our continuing mission to provide you with exceptional heart care, we have created designated Provider Care Teams. These Care Teams include your primary Cardiologist (physician) and Advanced Practice Providers (APPs- Physician Assistants and Nurse Practitioners) who all work together to provide you with the care you need, when you need it.   You may see any of the following providers on your designated Care Team at your next follow up: Dr Glori Bickers Dr [name] Dr. Roxana Hires, NP Lyda Jester, [place] [facility] [place], [place] Forestine Na, NP Audry Riles, PharmD   Please be sure to bring in all your medications bottles to every appointment.

## 2022-06-21 NOTE — Progress Notes (Signed)
Date:  06/21/2022  ID:  [name] [name], DOB 1940-11-12, MRN [id number]  Provider location: [facility] Type of Visit: Established patient   PCP:  [name], [name], MD  Cardiologist: Dr. Aundra Dubin   History of Present Illness: [name] [name] is a 81 y.o. male who has history of HTN, aortic stenosis and paroxysmal atrial fibrillation. He was hospitalized with atrial fibrillation/RVR in 5/14.  He had TEE-guided cardioversion.  TEE showed bicuspid aortic valve with mild AS and a moderately dilated ascending aorta.  Most echo in 4/17 showed moderate aortic stenosis and MRA chest in 11/17 showed 4.1 cm ascending aorta.    He was on Xarelto for anticoagulation but stopped it as he was convinced it was causing joint pains.  He then refused to start any other anticoagulation at that time.     In 5/17, he had been back in atrial fibrillation for several days and was symptomatic.  I started him on Eliquis and planned TEE-guided DCCV given significant symptoms, but he converted back to NSR on his own.  He continued Eliquis for about 1 month then stopped it on his own.     In 11/17, he was hospitalized with symptomatic atrial fibrillation with RVR.  I started him on diltiazem CD and Eliquis with plan for TEE-guided DCCV.  However, he converted back to NSR on his own. He stopped the diltiazem but has continued the Eliquis.    He was admitted in 2/18 with fever, LUL PNA and left-sided pleural effusion.  Thoracentesis on left was suggestive of parapneumonic effusion.  He was in the hospital 11 days.  During that time, he went into atrial fibrillation with RVR.  He was started on amiodarone and went back into NSR.  Amiodarone was subsequently stopped.    Recurrent atrial fibrillation with RVR in 2/19, felt more fatigued.  He underwent TEE-guided DCCV back to NSR.    He had a lower GI bleed in 11/19 post-polypectomy.  He has since restarted on Eliquis.    Coronary CTA was done in  2/20, this showed mild nonobstructive CAD.  Echo in 2/20 showed EF 60-65%, bicuspid aortic valve with severe AS.   LHC in 6/20 showed no significant coronary disease.  In 8/20, he had TAVR with Jenkins 3 THV x 2 (valve in valve due to peri-valvular leak initially).  Post-procedure echoes have shown elevated gradient across the aortic valve though dimensionless index has only been in the mildly stenotic range.  Last echo in 9/20 showed mean aortic valve gradient 26 mmHg with dimensionless index 0.54.   Patient has additionally been diagnosed with multiple myeloma.  He was treated with Revlimid, Velcade, and prednisone.   He was admitted early in 1/21 with atrial fibrillation/RVR associated with ischemic colitis.  He underwent TEE-guided DCCV to NSR.   He was readmitted later in 1/21 with PNA.    He fell in 4/21, tripped and fractured his left proximal humerus.  He had ORIF in 5/21.   In 7/21, he had a left inguinal hernia repair.  Echo in 8/21 showed EF 60-65%, normal RV, ascending aorta 45 mm, stable TAVR valve with mean gradient 15 mmHg.   Echo in 8/22 showed EF 60-65% RV ok.  Bioprosthetic AVR s/p TAVR with mean gradient 13 IVC small.   Echo in 9/23 showed EF 65-70% with normal RV, PASP 34, s/p TAVR with mean gradient up to 19 mmHg.  Gated CT was then done to look  at the aortic valve more closely, this showed 4.4 cm ascending aorta with small PFO, HALT at aortic valve leaflet bases without significant restriction, this was consistent with leaflet thrombosis.   Patient returns for followup of atrial fibrillation, TAVR.  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  I suggested transition to warfarin with INR 2.5-3.5, but he does not want to switch to warfarin.  He is stable symptomatically, denies significant dyspnea or chest pain with his usual activities.  No orthopnea/PND.  No lightheadedness.   ECG (personally  reviewed): NSR, 1st degree AVB 210 msec, nonspecific TW flattening.   Labs (5/14): K 3.7, creatinine 0.9, BNP 2261=>109, TSH normal Labs (7/14): K 3.5, creatinine 1.0 Labs (12/14): K 3.8, creatinine 1.0, LDL particle number 1374, LDL 107, TSH normal Labs (6/16): TSH normal, K 3.8, creatinine 0.84, HCT 42.4, LDL 85, LFTs normal Labs (3/17): K 3.8, creatinine 0.87 Labs (5/17): K 4.5, creatinine 0.97, HCT 43.7 Labs (11/17): K 3.2, creatinine 0.81, LDL 71, HDL 24 Labs (2/18): K 3.8, creatinine 0.89 Labs (2/19): K 3.7, creatinine 0.83, hgb 16 Labs (11/19): LDL 82 Labs (1/20): TSH elevated but free T4 normal, K 4, creatinine 0.85, hgb 13.5 Labs (2/20): TSH mildly elevated but free T4 normal, K 4, creatinine 0.85 Labs (5/20): ESR 48 Labs (9/20): hgb 14.1, plts 38 => 82, K 3.3, creatinine 0.98 => 1.06 Labs (11/20): BNP 126.5 Labs (12/20): hgb 11.9, K 3.2, creatinine 0.87 Labs (1/21): K 4.8, creatinine 1.42, AST 30, ALT 60 Labs (4/21): K 4.5, creatinine 1.22, hgb 12.3, plts 122 Labs (7/21): hgb 13.7, K 4.6, creatinine 1.31, AST 36, ALT 76 Labs (3/22): K 3.7, creatinine 1.05, LFTs normal Labs (9/23): K 4.3, creatinine 1.16, hgb 14.5   Allergies (verified):  No Known Drug Allergies    Past Medical History:  1. Hypertension: ACEI cough. Edema with amlodipine.  2. Atrial fibrillation. The patient had new-onset atrial fibrillation in November 2009. He underwent ibutilide cardioversion successfully. He has had 1-2 episodes/year that are short-lived that likely are atrial fibrillation with RVR. He was admitted in 5/14 with atrial fibrillation/RVR and had TEE-guided DCCV.  Atrial fibrillation again in 5/17, converted back to NSR spontaneously.  CHADSVASC score 2.  - Atrial fibrillation 2/19 with TEE-guided DCCV.  - 1/21 TEE-guided DCCV 3. Hypercholesterolemia.  4. Bicuspid aortic valve disorder: Echo (7/11): EF 55-60%, mild LV hypertrophy, mild aortic stenosis with mean gradient 19 mmHg and peak  gradient 36 mmHg.  TEE (5/14): EF 55%, mild LVH, bicuspid aortic valve with mild AS (mean gradient 13 mmHg), ascending aorta 4.5 cm.  Echo (9/15) with EF 65-70%, moderate AS (mean gradient 23 mmHg), mild AI, ascending aorta 4.1 cm, mild MR.   - Echo (4/17) with EF 65-70%, moderate aortic stenosis with mean gradient 27 mmHg, PASP 31 mmHg, ascending aorta 4.4 cm.  - Echo (2/18) with EF 55-60%, bicuspid aortic valve with moderate aortic stenosis (underestimated gradient).  - TEE (2/19): EF 60-65%, moderate LVH, bicuspid aortic valve with moderate AS with mean gradient 28 mmHg and AVA 1.2 cm^2, 4.4 cm ascending aorta.  - Echo (2/20): EF 60-65%, mild LVH, bicuspid aortic valve with severe AS (mean gradient 48 mmHg, AVA 0.8 cm^2).  - TAVR 8/20 with valve in valve Edwards Sapien 3 THVs (because of peri-valvular leak after initial valve placed).  - Echo (9/20): EF > 65%, mild LVH, mild RV dilation with normal RV systolic function, mean aortic valve gradient  26 mmHg with dimensionless index 0.54, IVC normal.  - TEE (1/21): EF 60-65%, moderate LVH, normal RV, s/p valve-in-valve TAVR with mean gradient 8 mmHg and no PVL.  - Echo (8/21): EF 60-65%, normal RV, ascending aorta 45 mm, stable TAVR valve with mean gradient 15 mmHg.  - Echo (8/22): EF 70-75%, s/p TAVR mean gradient 12 mmHg.  - Echo (9/23): EF 65-70% with normal RV, PASP 34, s/p TAVR with mean gradient up to 19 mmHg. - Gated CT (9/23) showed 4.4 cm ascending aorta with small PFO, HALT at aortic valve leaflet bases without significant restriction, this was consistent with leaflet thrombosis. 5. Osteoarthritis.  6. Low back pain.  7. Chest pain: ETT-myoview (12/11) with 10:51 exercise, no chest pain, no significant ST changes, EF 69%, no evidence for ischemia or infarction.  - Coronary CTA (2/20): calcium score 41, nonobstructive CAD.  - LHC (6/20): No significant CAD.  8. Ascending aortic aneurysm: Associated with bicuspid aortic valve.  4.5 cm by TEE  in 5/14.   MRA chest (6/14) with bicuspid aortic valve, 4.3 cm ascending aortic aneurysm.  MRA chest (10/15) with 4.2 cm ascending aorta (bicuspid aortic valve noted).  Echo (4/17) with ascending aorta diameter 4.4 cm.  - MRA chest (11/17) with 4.1 cm ascending aorta.  - Echo (2/19): 4.4 cm ascending aorta.  - Coronary CTA in 2/20 showed 4.3 cm ascending aorta.  - Echo (8/21) with 4.5 cm ascending aorta.  - Gated CT chest (9/23): 4.4 cm ascending aorta 9. Colonic polyps: GI bleeding in 11/19 s/p polypectomy.  10. Multiple myeloma.  11. Bradycardia 12. Thrombocytopenia: likely related to chemotherapy for multiple myeloma.  13. PVCs: Zio monitor in 10/20 showed 9.4% PVCs.  14. Chronic diastolic CHF: RHC (15/72) with mean RA 3, PA 22/2, mean PCWP 6, CI 2.74 15. Left inguinal hernia: s/p repair.    Current Outpatient Medications  Medication Sig Dispense Refill   aspirin 81 MG chewable tablet Chew 1 tablet (81 mg total) by mouth daily. 90 tablet 1   b complex vitamins tablet Take 1 tablet by mouth daily. 100 tablet 3   Calcium-Magnesium 500-250 MG TABS Take 1 tablet by mouth daily.     Carboxymethylcellul-Glycerin (LUBRICATING EYE DROPS OP) Place 1 drop into both eyes daily as needed (dry eyes).     Cholecalciferol (VITAMIN D) 50 MCG (2000 UT) tablet Take 2,000 Units by mouth daily.     diclofenac Sodium (VOLTAREN) 1 % GEL Apply 1 application topically 2 (two) times daily as needed (pain.).     ELIQUIS 5 MG TABS tablet TAKE 1 TABLET BY MOUTH TWICE A DAY 60 tablet 11   gabapentin (NEURONTIN) 300 MG capsule Take 1 capsule (300 mg total) by mouth at bedtime. 90 capsule 3   ipratropium (ATROVENT) 0.06 % nasal spray Place 2 sprays into the nose 3 (three) times daily. 15 mL 2   lenalidomide (REVLIMID) 5 MG capsule Take 1 capsule (5 mg total) by mouth daily. Take 1 capsule (5 mg total) by mouth daily for 21 days then take 7 days off 21 capsule 0   metoprolol succinate (TOPROL-XL) 25 MG 24 hr tablet  TAKE 1 TABLET (25 MG TOTAL) BY MOUTH 2 (TWO) TIMES DAILY. NEEDS FOLLOW UP FOR MORE REFILLS 180 tablet 1   metroNIDAZOLE (METROCREAM) 0.75 % cream Apply 1 application topically 2 (two) times daily.     oxyCODONE (OXY IR/ROXICODONE) 5 MG immediate release tablet Take 1-2 tablets (5-10 mg total) by mouth 2 (two) times  daily as needed for severe pain. 120 tablet 0   polyethylene glycol powder (GLYCOLAX/MIRALAX) 17 GM/SCOOP powder Take 17-34 g by mouth 2 (two) times daily as needed for moderate constipation. 500 g 5   senna-docusate (SENOKOT-S) 8.6-50 MG tablet Take 2 tablets by mouth at bedtime.     spironolactone (ALDACTONE) 25 MG tablet TAKE 1 TABLET BY MOUTH EVERY DAY 90 tablet 3   Vitamin D, Ergocalciferol, (DRISDOL) 1.25 MG (50000 UNIT) CAPS capsule TAKE 1 CAPSULE BY MOUTH ONE TIME PER WEEK 12 capsule 3   No current facility-administered medications for this encounter.    Allergies:   Xarelto [rivaroxaban], Corticosteroids, Ramipril, Zolpidem, and Benazepril   Social History:  The patient  reports that he has never smoked. He has never used smokeless tobacco. He reports that he does not currently use alcohol. He reports that he does not use drugs.   Family History:  The patient's family history includes Colon cancer (age of onset: 59) in his mother; Hypertension in an other family member.   ROS:  Please see the history of present illness.   All other systems are personally reviewed and negative.   Exam:   BP 130/70   Pulse (!) 56   Wt 80.3 kg (177 lb)   SpO2 94%   BMI 25.40 kg/m  General: NAD Neck: No JVD, no thyromegaly or thyroid nodule.  Lungs: Clear to auscultation bilaterally with normal respiratory effort. CV: Nondisplaced PMI.  Heart regular S1/S2, no S3/S4, 2/6 SEM RUSB with clear S2.  Trace ankle edema.  No carotid bruit.  Normal pedal pulses.  Abdomen: Soft, nontender, no hepatosplenomegaly, no distention.  Skin: Intact without lesions or rashes.  Neurologic: Alert and  oriented x 3.  Psych: Normal affect. Extremities: No clubbing or cyanosis.  HEENT: Normal.   Recent Labs: 05/14/2022: ALT 47; BUN 21; Creatinine 1.16; Hemoglobin 14.5; Platelet Count 120; Potassium 4.3; Sodium 139  Personally reviewed   Wt Readings from Last 3 Encounters:  06/21/22 80.3 kg (177 lb)  06/03/22 81.1 kg (178 lb 12.8 oz)  05/18/22 79.7 kg (175 lb 9.6 oz)    ASSESSMENT AND PLAN:  1. Atrial fibrillation: Paroxysmal. He is quite symptomatic when in atrial fibrillation.  CHADSVASC = 3.  NSR currently with no recent atrial fibrillation symptoms.  - Continue Eliquis.  - He has not wanted atrial fibrillation ablation.   2. Peripheral neuropathy: Patient developed a very painful peripheral neuropathy on Velcade.  This is still very symptomatic.  3. Bradycardia: After TAVR, patient had RBBB and LAFB with HR down to the 40s.  RBBB has resolved and HR is higher.  4. PVCs: Zio monitor in 10/20 showed 9.4% PVCs.  Toprol XL was increased to 25 mg bid, he is not feeling palpitations.   5. HTN: Stable.  - Continue spironolactone 25 mg daily.  6. Bicuspid aortic valve disorder: S/P TAVR with 2 Edwards Sapien THVs (valve-in-valve due to peri-valvular leak after 1st valve placed). Post-op, mean gradient across the aortic valve was elevated, 26 mmHg in 9/20.  Dimensionless index, in 9/20 0.54, only suggests mild bioprosthetic aortic stenosis.  Echo in 1/21 showed mean gradient down to 10 mmHg and TEE showed mean gradient 8 mmHg with no peri-valvular leakage.  Echo in 8/22 with mean gradient 12 mmHg.  Echo in 9/23 showed increase mean gradient up to 19 mmHg across bioprosthetic aortic valve.  Gated CT chest showed HALT at leaflet bases without restriction suggestive of leaflet thrombosis.  - I recommended that  he transition from apixaban to warfarin with INR 2.5-3.5. He does not want to take warfarin given need for monitoring and restriction on diet. Therefore, I will have him add ASA 81 mg daily to  Eliquis.  - Repeat echo in 6 months to make sure mean gradient is not progressing up.  7. Ascending aortic aneurysm: CT chest in 9/23 showed 4.4 cm ascending aorta.  - Follow aorta by echo for now, he would likely be a poor candidate for ascending aorta replacement given frailty and age.  8. Chronic diastolic CHF:  He is not volume overloaded on exam.  He does not take Lasix.  9. Multiple myeloma: He continues on Revlimid.     Followup in 6 months.   Signed, [name], MD  06/21/2022  [facility] [address] [facility] [place] [address] [phone] (office) [phone] (fax)

## 2022-07-13 ENCOUNTER — Other Ambulatory Visit: Payer: Self-pay

## 2022-07-13 DIAGNOSIS — C9000 Multiple myeloma not having achieved remission: Secondary | ICD-10-CM

## 2022-07-13 MED ORDER — LENALIDOMIDE 5 MG PO CAPS: 5.0000 mg | ORAL_CAPSULE | Freq: Every day | ORAL | 0 refills | Status: DC

## 2022-07-16 ENCOUNTER — Other Ambulatory Visit (HOSPITAL_COMMUNITY): Payer: Self-pay

## 2022-07-19 ENCOUNTER — Telehealth: Payer: Self-pay | Admitting: Pharmacy Technician

## 2022-07-19 NOTE — Telephone Encounter (Signed)
[facility] Patient Advocate Encounter   Received notification that patient is due for re-enrollment for assistance for Revlimid through [employer].   Re-enrollment process has been initiated and will be submitted upon completion of necessary documents.  [employer] phone number [phone] .   I will continue to follow until final determination.  [name] [name], CPhT-[facility] [place] Direct Number: [phone]  Fax: [phone]

## 2022-07-29 ENCOUNTER — Other Ambulatory Visit: Payer: Self-pay | Admitting: Internal Medicine

## 2022-08-02 NOTE — Telephone Encounter (Signed)
Pts wife left vm stating pt is back in afib and requests an appt with Dr.McLean. called pt no answer

## 2022-08-02 NOTE — Telephone Encounter (Signed)
Pt left urgent message on triage line regarding Afib, please advise

## 2022-08-02 NOTE — Telephone Encounter (Signed)
Ok to work in with APP or me.  Can see me on hospital day.

## 2022-08-03 MED ORDER — AMIODARONE HCL 200 MG PO TABS: 200.0000 mg | ORAL_TABLET | Freq: Two times a day (BID) | ORAL | 3 refills | Status: DC

## 2022-08-03 NOTE — Telephone Encounter (Signed)
Pt aware and appt scheduled.  

## 2022-08-03 NOTE — Telephone Encounter (Signed)
Sounds like he is in atrial fibrillation.  Would be reasonable to start amiodarone 200 mg bid. Get him in soon please. I will see whenever or can get with APP.

## 2022-08-06 ENCOUNTER — Ambulatory Visit: Payer: Medicare Other | Admitting: Hematology

## 2022-08-06 ENCOUNTER — Other Ambulatory Visit: Payer: Self-pay

## 2022-08-06 ENCOUNTER — Other Ambulatory Visit: Payer: Medicare Other

## 2022-08-06 ENCOUNTER — Encounter (HOSPITAL_COMMUNITY): Payer: Self-pay

## 2022-08-06 ENCOUNTER — Ambulatory Visit: Payer: Medicare Other

## 2022-08-06 ENCOUNTER — Emergency Department (HOSPITAL_COMMUNITY): Payer: Medicare Other

## 2022-08-06 ENCOUNTER — Emergency Department (HOSPITAL_COMMUNITY)
Admission: EM | Admit: 2022-08-06 | Discharge: 2022-08-06 | Disposition: A | Discharge status: Home / Self Care | Payer: Medicare Other | Attending: Emergency Medicine | Admitting: Emergency Medicine

## 2022-08-06 ENCOUNTER — Telehealth: Payer: Self-pay | Admitting: Physician Assistant

## 2022-08-06 DIAGNOSIS — Z79899 Other long term (current) drug therapy: Secondary | ICD-10-CM | POA: Insufficient documentation

## 2022-08-06 DIAGNOSIS — I110 Hypertensive heart disease with heart failure: Secondary | ICD-10-CM | POA: Insufficient documentation

## 2022-08-06 DIAGNOSIS — I480 Paroxysmal atrial fibrillation: Secondary | ICD-10-CM | POA: Insufficient documentation

## 2022-08-06 DIAGNOSIS — Z7982 Long term (current) use of aspirin: Secondary | ICD-10-CM | POA: Diagnosis not present

## 2022-08-06 DIAGNOSIS — R0602 Shortness of breath: Secondary | ICD-10-CM | POA: Diagnosis not present

## 2022-08-06 DIAGNOSIS — Z7901 Long term (current) use of anticoagulants: Secondary | ICD-10-CM | POA: Insufficient documentation

## 2022-08-06 DIAGNOSIS — I509 Heart failure, unspecified: Secondary | ICD-10-CM | POA: Insufficient documentation

## 2022-08-06 LAB — CBC
HCT: 47.6 % (ref 39.0–52.0)
Hemoglobin: 15.7 g/dL (ref 13.0–17.0)
MCH: 30.4 pg (ref 26.0–34.0)
MCHC: 33 g/dL (ref 30.0–36.0)
MCV: 92.1 fL (ref 80.0–100.0)
Platelets: 125 10*3/uL — ABNORMAL LOW (ref 150–400)
RBC: 5.17 MIL/uL (ref 4.22–5.81)
RDW: 13.2 % (ref 11.5–15.5)
WBC: 5.6 10*3/uL (ref 4.0–10.5)
nRBC: 0 % (ref 0.0–0.2)

## 2022-08-06 LAB — BASIC METABOLIC PANEL
Anion gap: 8 (ref 5–15)
BUN: 23 mg/dL (ref 8–23)
CO2: 25 mmol/L (ref 22–32)
Calcium: 9 mg/dL (ref 8.9–10.3)
Chloride: 109 mmol/L (ref 98–111)
Creatinine, Ser: 1.24 mg/dL (ref 0.61–1.24)
GFR, Estimated: 58 mL/min — ABNORMAL LOW (ref >60)
Glucose, Bld: 113 mg/dL — ABNORMAL HIGH (ref 70–99)
Potassium: 3.9 mmol/L (ref 3.5–5.1)
Sodium: 142 mmol/L (ref 135–145)

## 2022-08-06 LAB — TROPONIN I (HIGH SENSITIVITY)
Troponin I (High Sensitivity): 16 ng/L (ref <18)
Troponin I (High Sensitivity): 18 ng/L — ABNORMAL HIGH (ref <18)

## 2022-08-06 LAB — BRAIN NATRIURETIC PEPTIDE: B Natriuretic Peptide: 229.8 pg/mL — ABNORMAL HIGH (ref 0.0–100.0)

## 2022-08-06 MED ORDER — MIDAZOLAM HCL 2 MG/2ML IJ SOLN
2.0000 mg | Freq: Once | INTRAMUSCULAR | Status: DC
  Filled 2022-08-06: qty 2

## 2022-08-06 MED ORDER — ALBUTEROL SULFATE HFA 108 (90 BASE) MCG/ACT IN AERS: 2.0000 | INHALATION_SPRAY | RESPIRATORY_TRACT | Status: DC | PRN

## 2022-08-06 MED ORDER — FENTANYL CITRATE (PF) 100 MCG/2ML IJ SOLN
INTRAMUSCULAR | Status: AC | PRN
  Administered 2022-08-06: 100 ug via INTRAVENOUS

## 2022-08-06 MED ORDER — MIDAZOLAM HCL 2 MG/2ML IJ SOLN
INTRAMUSCULAR | Status: AC | PRN
  Administered 2022-08-06: 2 mg via INTRAVENOUS

## 2022-08-06 MED ORDER — FENTANYL CITRATE PF 50 MCG/ML IJ SOSY
100.0000 ug | PREFILLED_SYRINGE | Freq: Once | INTRAMUSCULAR | Status: DC
  Filled 2022-08-06: qty 2

## 2022-08-06 NOTE — Discharge Instructions (Addendum)
Return to ED with any new or worsening sign or symptoms such as chest pain or shortness of breath Please follow-up in 2 weeks with A-fib clinic as previously scheduled Please continue taking amiodarone twice daily as well as your blood thinner Please continue monitoring pulse with a pulse oximeter at home

## 2022-08-06 NOTE — ED Provider Triage Note (Signed)
Emergency Medicine Provider Triage Evaluation Note  Field [name] [name] , a 81 y.o. male  was evaluated in triage.  Pt with h/o hypertension, aortic stenosis s/p TAVR and paroxysmal atrial fibrillation.  Pt complains of episodes of increased heart rate, shortness of breath, generalized weakness over the past 5 days.  He is concerned that he is in atrial fibrillation again.  He was instructed recently by cardiology to start amiodarone.  Reports that he has required cardioversion in the past.  Some chest pressure with racing heart rate sensation.  Review of Systems  Positive: Shortness of breath, weakness, palpitations Negative: Fever, cough  Physical Exam  BP (!) 146/106 (BP Location: Left Arm)   Pulse 79   Temp 98 F (36.7 C)   Resp 19   SpO2 100%  Gen:   Awake, no distress   Resp:  Normal effort  MSK:   Moves extremities without difficulty  Other:  Irregular heart rhythm  Medical Decision Making  Medically screening exam initiated at 12:08 PM.  Appropriate orders placed.  [name] [name] was informed that the remainder of the evaluation will be completed by another provider, this initial triage assessment does not replace that evaluation, and the importance of remaining in the ED until their evaluation is complete.     Carlisle Cater, PA-C 08/06/22 1210

## 2022-08-06 NOTE — ED Provider Notes (Signed)
[facility] Provider Note   CSN: [id number] Arrival date & time: 08/06/22  1140     History  Chief Complaint  Patient presents with   Shortness of Breath    Sylvio Lozito is a 81 y.o. male with medical history of paroxysmal A-fib on Eliquis, hypertension, GERD, multiple myeloma, heart failure.  Patient presents to ED for evaluation of shortness of breath and chest pain.  Patient reports that chest pain and shortness of breath began on Sunday.  Patient reports at that time he felt like there was "jumping" in his chest and he noted that his pulse rate was range between 160 to 170 bpm on a pulse oximeter.  Patient states that this continued until Tuesday when he called his cardiologist, Dr. Aundra Dubin.  Cardiologist Dr. Aundra Dubin advised the patient to begin taking amiodarone twice daily to 100 mg which she did.  The patient reports that he is still having the symptoms of shortness of breath and "jumping" in his chest despite the amiodarone.  Patient denies any nausea, vomiting, fevers, abdominal pain, lightheadedness, dizziness, syncope.  Patient denies any leg swelling.   Shortness of Breath Associated symptoms: chest pain   Associated symptoms: no abdominal pain and no vomiting        Home Medications Prior to Admission medications   Medication Sig Start Date End Date Taking? Authorizing Provider  amiodarone (PACERONE) 200 MG tablet Take 1 tablet (200 mg total) by mouth 2 (two) times daily. 08/03/22   Larey Dresser, MD  aspirin 81 MG chewable tablet Chew 1 tablet (81 mg total) by mouth daily. 06/21/22   Larey Dresser, MD  b complex vitamins tablet Take 1 tablet by mouth daily. 09/25/19   [name], [name], MD  Calcium-Magnesium 500-250 MG TABS Take 1 tablet by mouth daily.    [provider]  Carboxymethylcellul-Glycerin (LUBRICATING EYE DROPS OP) Place 1 drop into both eyes daily as needed (dry eyes).    [provider]   Cholecalciferol (VITAMIN D) 50 MCG (2000 UT) tablet Take 2,000 Units by mouth daily.    [provider]  diclofenac Sodium (VOLTAREN) 1 % GEL Apply 1 application topically 2 (two) times daily as needed (pain.).    [provider]  ELIQUIS 5 MG TABS tablet TAKE 1 TABLET BY MOUTH TWICE A DAY 03/10/22   Larey Dresser, MD  gabapentin (NEURONTIN) 300 MG capsule Take 1 capsule (300 mg total) by mouth at bedtime. 02/10/22   [name], [name], MD  ipratropium (ATROVENT) 0.06 % nasal spray PLACE 2 SPRAYS INTO THE NOSE 3 (THREE) TIMES DAILY. 07/30/22 07/30/23  [name], [name], MD  lenalidomide (REVLIMID) 5 MG capsule Take 1 capsule (5 mg total) by mouth daily. Take 1 capsule (5 mg total) by mouth daily for 21 days then take 7 days off 07/13/22   Brunetta Genera, MD  metoprolol succinate (TOPROL-XL) 25 MG 24 hr tablet TAKE 1 TABLET (25 MG TOTAL) BY MOUTH 2 (TWO) TIMES DAILY. NEEDS FOLLOW UP FOR MORE REFILLS 04/28/22   Larey Dresser, MD  metroNIDAZOLE (METROCREAM) 0.75 % cream Apply 1 application topically 2 (two) times daily.    [provider]  oxyCODONE (OXY IR/ROXICODONE) 5 MG immediate release tablet Take 1-2 tablets (5-10 mg total) by mouth 2 (two) times daily as needed for severe pain. 06/03/22   [name], [name], MD  polyethylene glycol powder (GLYCOLAX/MIRALAX) 17 GM/SCOOP powder Take 17-34 g by mouth 2 (two) times daily  as needed for moderate constipation. 09/25/19   [name], [name], MD  senna-docusate (SENOKOT-S) 8.6-50 MG tablet Take 2 tablets by mouth at bedtime.    [provider]  spironolactone (ALDACTONE) 25 MG tablet TAKE 1 TABLET BY MOUTH EVERY DAY 10/07/21   Larey Dresser, MD  Vitamin D, Ergocalciferol, (DRISDOL) 1.25 MG (50000 UNIT) CAPS capsule TAKE 1 CAPSULE BY MOUTH ONE TIME PER WEEK 05/04/21   [name], [name], MD      Allergies    Xarelto [rivaroxaban], Corticosteroids, Ramipril, Zolpidem, and Benazepril    Review of  Systems   Review of Systems  Constitutional:  Positive for fatigue.  Respiratory:  Positive for shortness of breath.   Cardiovascular:  Positive for chest pain and palpitations. Negative for leg swelling.  Gastrointestinal:  Negative for abdominal pain, nausea and vomiting.  Neurological:  Negative for dizziness, syncope and light-headedness.  All other systems reviewed and are negative.   Physical Exam Updated Vital Signs BP 118/71   Pulse (!) 54   Temp 98 F (36.7 C)   Resp (!) 21   SpO2 95%  Physical Exam Vitals and nursing note reviewed.  Constitutional:      General: He is not in acute distress.    Appearance: He is well-developed. He is not ill-appearing, toxic-appearing or diaphoretic.  HENT:     Head: Normocephalic and atraumatic.     Nose: Nose normal.     Mouth/Throat:     Mouth: Mucous membranes are moist.     Pharynx: Oropharynx is clear.  Eyes:     Extraocular Movements: Extraocular movements intact.     Pupils: Pupils are equal, round, and reactive to light.  Neck:     Vascular: No JVD.  Cardiovascular:     Rate and Rhythm: Normal rate. Rhythm irregular.  Pulmonary:     Effort: Pulmonary effort is normal.     Breath sounds: No decreased breath sounds, wheezing or rhonchi.  Chest:     Chest wall: No tenderness.  Abdominal:     General: Bowel sounds are normal.     Palpations: Abdomen is soft.     Tenderness: There is no abdominal tenderness.  Musculoskeletal:     Cervical back: Normal range of motion and neck supple.     Right lower leg: No edema.     Left lower leg: No edema.  Skin:    General: Skin is warm and dry.     Capillary Refill: Capillary refill takes less than 2 seconds.  Neurological:     Mental Status: He is alert and oriented to person, place, and time.     ED Results / Procedures / Treatments   Labs (all labs ordered are listed, but only abnormal results are displayed) Labs Reviewed  BASIC METABOLIC PANEL - Abnormal; Notable for  the following components:      Result Value   Glucose, Bld 113 (*)    GFR, Estimated 58 (*)    All other components within normal limits  CBC - Abnormal; Notable for the following components:   Platelets 125 (*)    All other components within normal limits  BRAIN NATRIURETIC PEPTIDE - Abnormal; Notable for the following components:   B Natriuretic Peptide 229.8 (*)    All other components within normal limits  TROPONIN I (HIGH SENSITIVITY) - Abnormal; Notable for the following components:   Troponin I (High Sensitivity) 18 (*)    All other components within normal limits  TROPONIN I (HIGH  SENSITIVITY)    EKG EKG Interpretation  Date/Time:  Friday August 06 2022 14:44:08 EST Ventricular Rate:  52 PR Interval:  203 QRS Duration: 104 QT Interval:  445 QTC Calculation: 414 R Axis:   -42 Text Interpretation: Sinus rhythm Left axis deviation Probable anteroseptal infarct, old Nonspecific repol abnormality, diffuse leads Confirmed by Malvin Johns [phone]) on 08/06/2022 3:56:17 PM  Radiology DG Chest 2 View  Result Date: 08/06/2022 CLINICAL DATA:  Shortness of breath EXAM: CHEST - 2 VIEW COMPARISON:  01/07/2020 radiograph and 10/15/2021 PET CT FINDINGS: The cardiomediastinal silhouette is unremarkable. Cardiac valve replacement again noted. Mild bibasilar atelectasis/scarring again noted. There is no evidence of focal airspace disease, pulmonary edema, suspicious pulmonary nodule/mass, pleural effusion, or pneumothorax. No acute bony abnormalities are identified. IMPRESSION: No acute cardiopulmonary disease. Electronically Signed   By: Margarette Canada M.D.   On: 08/06/2022 12:32    Procedures .Cardioversion  Date/Time: 08/06/2022 3:59 PM  Performed by: Azucena Cecil, PA-C Authorized by: Azucena Cecil, PA-C   Consent:    Consent obtained:  Written   Consent given by:  Patient   Risks discussed:  Cutaneous burn, death, induced arrhythmia and pain Pre-procedure  details:    Cardioversion basis:  Emergent   Rhythm:  Atrial fibrillation   Electrode placement:  Anterior-posterior Patient sedated: Yes. Refer to sedation procedure documentation for details of sedation.  Attempt one:    Shock (joules) attempt one: 120.   Shock outcome:  Conversion to normal sinus rhythm Post-procedure details:    Patient status:  Alert   Patient tolerance of procedure:  Tolerated well, no immediate complications    Medications Ordered in ED Medications  fentaNYL (SUBLIMAZE) injection 100 mcg (100 mcg Intravenous See Procedure Record 08/06/22 1439)  midazolam (VERSED) injection 2 mg (2 mg Intravenous See Procedure Record 08/06/22 1439)  midazolam (VERSED) injection (2 mg Intravenous Given 08/06/22 1439)  fentaNYL (SUBLIMAZE) injection (100 mcg Intravenous Given 08/06/22 1439)    ED Course/ Medical Decision Making/ A&P Clinical Course as of 08/06/22 1600  Fri Aug 06, 2022  1335 1. Is CV indicated since he is not in RVR. 2. If he has leaflet thrombosis, would we want to cardiovert him [CG]    Clinical Course User Index [CG] Azucena Cecil, PA-C                           Medical Decision Making Amount and/or Complexity of Data Reviewed Radiology: ordered.  Risk Prescription drug management.   81 year old male presents to ED for evaluation.  Please see HPI for further details.  On examination the patient is afebrile and nontachycardic.  The patient is in A-fib on the monitor at a controlled rate.  The patient is nonhypoxic on room air, his lung sounds are clear bilaterally.  There is no peripheral edema.  The patient abdomen is soft and compressible.  The patient neurological examination shows no focal neurodeficits.  Patient alert and orient x3.  Patient in no apparent distress.  Due to patient complaint we will proceed with the following imaging and lab studies to include CBC, BMP, troponin x2, BNP, chest x-ray, EKG.  Patient EKG shows patient in  A-fib however he is in a controlled rate.  The patient BMP is unremarkable.  The patient CBC is unremarkable without leukocytosis or anemia.  The patient troponin was initially 16, delta to 18.  Patient BNP is elevated to 229.8 however could be secondary to prolonged  A-fib as the patient does not appear volume overloaded and there is no evidence of volume overload on his chest x-ray.  The patient chest x-ray shows no acute disease.  Due to patient being in A-fib we consulted with cardiology.  Dr. Sallyanne Kuster has returned my call and advised to go ahead and cardiovert the patient.  I expressed that he and my attending were hesitant to do this because of her recent echocardiogram showing of leaflet thrombosis.  Dr. Sallyanne Kuster stated that as long as the patient was compliant on anticoagulation, which he states he is, the cardioversion should be acceptable procedure at this time.  Dr. Sallyanne Kuster has advised [place] to have this patient follow back up with him in the office in 2 weeks as previously scheduled and continue amiodarone as well as blood thinner.  Cardioversion was completed with myself and my attending Dr. Tamera Punt at the bedside.  The patient was procedurally sedated with fentanyl and Versed.  Patient underwent procedure well, he converted back into normal sinus rhythm which was confirmed with EKG.  At this time, the patient will be discharged home where he will follow-up with cardiology team.  Patient given return precautions and he is voiced understanding.  Patient is in office questions answered his satisfaction.  The patient is stable for discharge.  Final Clinical Impression(s) / ED Diagnoses Final diagnoses:  Paroxysmal atrial fibrillation [facility])    Rx / DC Orders ED Discharge Orders     None         Azucena Cecil, PA-C 08/06/22 1600    Malvin Johns, MD 08/07/22 0710

## 2022-08-06 NOTE — Telephone Encounter (Signed)
Patient is a 81 year old male with past medical history of hypertension, aortic stenosis s/p TAVR and paroxysmal atrial fibrillation.  Based on previous note, he is quite symptomatic every time he goes into atrial fibrillation.  He is on Eliquis and has been quite compliant with the blood thinner.  Echocardiogram obtained in September 2023 showed EF 65 to 70%, normal RV, PASP 34.   He went into atrial fibrillation on Sunday, 08/01/2022, he called heart failure service on Monday.  Dr. Aundra Dubin recommended him to start on amiodarone 200 mg twice a day dosing.  He has been taking amiodarone for the past 2 days.  He paged after hour answering service as he has not been feeling any better.  Although amiodarone is lowering his heart rate to some degree, however he remains very fatigued, short of breath, and very weak.  He is unable to go to sleep at night and the cannot do any activity without getting tired.  His next heart failure office visit is more than 2 weeks away.  He paged after hour answering service, given persistent symptoms, I recommended him to come to the [facility], ED.  Fortunately it does not sound like he is volume overloaded, he is just very symptomatic while in A-fib.  Based on recent echocardiogram and coronary CT in September 2023, patient has leaflet thrombosis.  However he has been compliant with Eliquis, I think if he is still symptomatic, he may be a candidate for ED cardioversion.

## 2022-08-06 NOTE — ED Provider Notes (Signed)
.  Sedation  Date/Time: 08/06/2022 3:53 PM  Performed by: Malvin Johns, MD Authorized by: Malvin Johns, MD   Consent:    Consent obtained:  Written   Consent given by:  Patient   Risks discussed:  Allergic reaction, respiratory compromise necessitating ventilatory assistance and intubation and vomiting Universal protocol:    Immediately prior to procedure, a time out was called: yes     Patient identity confirmed:  Verbally with patient Indications:    Procedure performed:  Cardioversion Pre-sedation assessment:    Time since last food or drink:  5   ASA classification: class 2 - patient with mild systemic disease     Mouth opening:  3 or more finger widths   Thyromental distance:  4 finger widths   Mallampati score:  III - soft palate, base of uvula visible   Neck mobility: normal     Pre-sedation assessments completed and reviewed: airway patency, cardiovascular function, hydration status, mental status, nausea/vomiting, pain level, respiratory function and temperature     Pre-sedation assessment completed:  08/06/2022 2:00 PM Immediate pre-procedure details:    Reassessment: Patient reassessed immediately prior to procedure     Reviewed: vital signs and NPO status     Verified: bag valve mask available, emergency equipment available, intubation equipment available, IV patency confirmed and oxygen available   Procedure details (see MAR for exact dosages):    Preoxygenation:  Nasal cannula   Sedation:  Midazolam   Intended level of sedation: moderate (conscious sedation)   Analgesia:  Fentanyl   Intra-procedure monitoring:  Blood pressure monitoring, cardiac monitor, continuous capnometry, continuous pulse oximetry, frequent LOC assessments and frequent vital sign checks   Intra-procedure events: none     Total Provider sedation time (minutes):  10 Post-procedure details:    Post-sedation assessment completed:  08/06/2022 3:55 PM   Attendance: Constant attendance by certified  staff until patient recovered     Recovery: Patient returned to pre-procedure baseline     Post-sedation assessments completed and reviewed: airway patency, cardiovascular function, hydration status, mental status, nausea/vomiting, pain level, respiratory function and temperature     Patient is stable for discharge or admission: yes     Procedure completion:  Tolerated well, no immediate complications     Malvin Johns, MD 08/06/22 1555

## 2022-08-06 NOTE — ED Triage Notes (Signed)
Pt arrived POV from home c/o [facility] and fatigue x5 days. Pt states he has a hx of a-fib and on Sunday his HR got up to 160.

## 2022-08-06 NOTE — ED Notes (Signed)
Patient verbalizes understanding of discharge instructions. Opportunity for questioning and answers were provided. Armband removed by staff, pt discharged from ED. Pt taken to ED entrance via wheel chair.  

## 2022-08-07 NOTE — Telephone Encounter (Signed)
Please arrange for [name] [name] to be seen this week, will likely need to set up D[facility]V.

## 2022-08-09 ENCOUNTER — Other Ambulatory Visit: Payer: Self-pay

## 2022-08-09 ENCOUNTER — Inpatient Hospital Stay: Payer: Medicare Other

## 2022-08-09 ENCOUNTER — Inpatient Hospital Stay: Payer: Medicare Other | Attending: Hematology

## 2022-08-09 ENCOUNTER — Inpatient Hospital Stay (HOSPITAL_BASED_OUTPATIENT_CLINIC_OR_DEPARTMENT_OTHER): Payer: Medicare Other | Admitting: Hematology

## 2022-08-09 VITALS — BP 140/59 | HR 52 | Temp 97.7°F | Resp 18 | Ht 70.0 in | Wt 178.9 lb

## 2022-08-09 DIAGNOSIS — Z800 Family history of malignant neoplasm of digestive organs: Secondary | ICD-10-CM | POA: Insufficient documentation

## 2022-08-09 DIAGNOSIS — C9002 Multiple myeloma in relapse: Secondary | ICD-10-CM | POA: Diagnosis not present

## 2022-08-09 DIAGNOSIS — I110 Hypertensive heart disease with heart failure: Secondary | ICD-10-CM | POA: Insufficient documentation

## 2022-08-09 DIAGNOSIS — I509 Heart failure, unspecified: Secondary | ICD-10-CM | POA: Diagnosis not present

## 2022-08-09 DIAGNOSIS — C7951 Secondary malignant neoplasm of bone: Secondary | ICD-10-CM | POA: Insufficient documentation

## 2022-08-09 DIAGNOSIS — Q231 Congenital insufficiency of aortic valve: Secondary | ICD-10-CM | POA: Insufficient documentation

## 2022-08-09 DIAGNOSIS — C9000 Multiple myeloma not having achieved remission: Secondary | ICD-10-CM | POA: Diagnosis not present

## 2022-08-09 DIAGNOSIS — D696 Thrombocytopenia, unspecified: Secondary | ICD-10-CM | POA: Diagnosis not present

## 2022-08-09 LAB — CMP (CANCER CENTER ONLY)
ALT: 27 U/L (ref 0–44)
AST: 21 U/L (ref 15–41)
Albumin: 4.6 g/dL (ref 3.5–5.0)
Alkaline Phosphatase: 66 U/L (ref 38–126)
Anion gap: 5 (ref 5–15)
BUN: 21 mg/dL (ref 8–23)
CO2: 30 mmol/L (ref 22–32)
Calcium: 9.5 mg/dL (ref 8.9–10.3)
Chloride: 105 mmol/L (ref 98–111)
Creatinine: 1.2 mg/dL (ref 0.61–1.24)
GFR, Estimated: >60 mL/min (ref >60)
Glucose, Bld: 95 mg/dL (ref 70–99)
Potassium: 4.1 mmol/L (ref 3.5–5.1)
Sodium: 140 mmol/L (ref 135–145)
Total Bilirubin: 0.6 mg/dL (ref 0.3–1.2)
Total Protein: 6.6 g/dL (ref 6.5–8.1)

## 2022-08-09 LAB — CBC WITH DIFFERENTIAL (CANCER CENTER ONLY)
Abs Immature Granulocytes: 0.03 10*3/uL (ref 0.00–0.07)
Basophils Absolute: 0.1 10*3/uL (ref 0.0–0.1)
Basophils Relative: 2 %
Eosinophils Absolute: 0.3 10*3/uL (ref 0.0–0.5)
Eosinophils Relative: 5 %
HCT: 43.2 % (ref 39.0–52.0)
Hemoglobin: 14.4 g/dL (ref 13.0–17.0)
Immature Granulocytes: 1 %
Lymphocytes Relative: 22 %
Lymphs Abs: 1.2 10*3/uL (ref 0.7–4.0)
MCH: 31.3 pg (ref 26.0–34.0)
MCHC: 33.3 g/dL (ref 30.0–36.0)
MCV: 93.9 fL (ref 80.0–100.0)
Monocytes Absolute: 0.7 10*3/uL (ref 0.1–1.0)
Monocytes Relative: 12 %
Neutro Abs: 3.2 10*3/uL (ref 1.7–7.7)
Neutrophils Relative %: 58 %
Platelet Count: 125 10*3/uL — ABNORMAL LOW (ref 150–400)
RBC: 4.6 MIL/uL (ref 4.22–5.81)
RDW: 13.7 % (ref 11.5–15.5)
WBC Count: 5.5 10*3/uL (ref 4.0–10.5)
nRBC: 0 % (ref 0.0–0.2)

## 2022-08-09 MED ORDER — VITAMIN D (ERGOCALCIFEROL) 1.25 MG (50000 UNIT) PO CAPS: ORAL_CAPSULE | ORAL | 3 refills | Status: DC

## 2022-08-09 NOTE — Progress Notes (Signed)
HEMATOLOGY/ONCOLOGY CLINIC NOTE  Date of Service: 08/09/22     Patient Care Team: Cassandria Anger, MD as PCP - General Larey Dresser, MD as PCP - Advanced Heart Failure (Cardiology) Larey Dresser, MD as PCP - Cardiology (Cardiology) Erline Levine, MD as Attending Physician (Neurosurgery) Jarome Matin, MD as Consulting Physician (Dermatology) Rigoberto Noel, MD as Consulting Physician (Pulmonary Disease) Alda Berthold, DO as Consulting Physician (Neurology)   CHIEF COMPLAINTS/PURPOSE OF CONSULTATION:  Follow-up for continued evaluation and management of multiple myeloma  HISTORY OF PRESENTING ILLNESS:  Please see previous notes for details on initial presentation  INTERVAL HISTORY:  [name] [name] Is a 81 y.o. male is here for continued evaluation and management of multiple myeloma.  Patient was last seen by me on 05/14/2022 and was doing well overall. He did complain of grade 1 cramping due to Revlimid but no other toxicities.   Patient is doing well overall without medical concern since our last visit. He denies any toxicities with his Revlimid and Zometa. He denies back pain, abdominal pain, fever, chills, night sweats, or leg swelling.   He notes he was admitted in ED on 08/06/2022 due to shortness of breath and chest pain, was diagnosed with atrial fibrillation. Patient had a cardioversion conducted at the ED.    MEDICAL HISTORY:  Past Medical History:  Diagnosis Date   Ascending aortic aneurysm (HCC)    Bicuspid aortic valve    Cancer (HCC)    CHF NYHA class I (no symptoms from ordinary activities), acute, diastolic ([place])    Dysrhythmia 2009   A fib   Fatty liver    mild   Fracture    left proximal humerus   GERD (gastroesophageal reflux disease)    GI bleeding 07/21/2018   post polypectomy   Hemorrhoids    HTN (hypertension)    Hypercholesteremia    Hypokalemia    Internal hemorrhoids    LBP (low back pain)    Moderate aortic stenosis     Osteoarthritis    Paroxysmal atrial fibrillation ([place])    a. new onset Afib in 07/2008. He underwent ibutilide cardioversion successfully. b. Recurrence 01/2013 s/p TEE/DCCV - was on Xarelto but he stopped it as he was convinced it was causing joint pn. c. Recurrence 01/2016 - spont conv to NSR. Pt took Eliquis x1 mo then declined further anticoag. d. Recurrence 07/2016.   Pneumonia    Tubular adenoma of colon     SURGICAL HISTORY: Past Surgical History:  Procedure Laterality Date   BACK SURGERY  x12 years ago   [place] VALVE REPLACEMENT  2020   CARDIOVERSION N/A 01/26/2013   Procedure: CARDIOVERSION;  Surgeon: Larey Dresser, MD;  Location: [place];  Service: Cardiovascular;  Laterality: N/A;   CARDIOVERSION N/A 10/28/2017   Procedure: CARDIOVERSION;  Surgeon: Larey Dresser, MD;  Location: [facility] ENDOSCOPY;  Service: Cardiovascular;  Laterality: N/A;   CARDIOVERSION N/A 03/03/2018   Procedure: CARDIOVERSION;  Surgeon: Lelon Perla, MD;  Location: [facility] ENDOSCOPY;  Service: Cardiovascular;  Laterality: N/A;   CARDIOVERSION N/A 09/19/2019   Procedure: CARDIOVERSION;  Surgeon: Larey Dresser, MD;  Location: [facility] ENDOSCOPY;  Service: Cardiovascular;  Laterality: N/A;   COLONOSCOPY     COLONOSCOPY  07/17/2018   at [place] Left 03/18/2020   Procedure: OPEN LEFT INGUINAL HERNIA REPAIR;  Surgeon: Alphonsa Overall, MD;  Location: [place];  Service: General;  Laterality: Left;   LUMBAR LAMINECTOMY     ORIF HUMERUS FRACTURE Left 01/15/2020   Procedure: OPEN REDUCTION INTERNAL FIXATION (ORIF) PROXIMAL HUMERUS FRACTURE;  Surgeon: Nicholes Stairs, MD;  Location: [place];  Service: Orthopedics;  Laterality: Left;   POLYPECTOMY     RIGHT HEART CATH N/A 08/20/2019   Procedure: RIGHT HEART CATH;  Surgeon: Larey Dresser, MD;  Location: [place] CV LAB;  Service: Cardiovascular;  Laterality: N/A;   RIGHT/LEFT HEART CATH AND  CORONARY ANGIOGRAPHY N/A 03/07/2019   Procedure: RIGHT/LEFT HEART CATH AND CORONARY ANGIOGRAPHY;  Surgeon: Burnell Blanks, MD;  Location: [place] CV LAB;  Service: Cardiovascular;  Laterality: N/A;   TAVAR  04/2019   TEE WITHOUT CARDIOVERSION N/A 01/26/2013   Procedure: TRANSESOPHAGEAL ECHOCARDIOGRAM (TEE);  Surgeon: Larey Dresser, MD;  Location: [place];  Service: Cardiovascular;  Laterality: N/A;   TEE WITHOUT CARDIOVERSION N/A 10/28/2017   Procedure: TRANSESOPHAGEAL ECHOCARDIOGRAM (TEE);  Surgeon: Larey Dresser, MD;  Location: [facility] ENDOSCOPY;  Service: Cardiovascular;  Laterality: N/A;   TEE WITHOUT CARDIOVERSION N/A 05/08/2019   Procedure: TRANSESOPHAGEAL ECHOCARDIOGRAM (TEE);  Surgeon: Burnell Blanks, MD;  Location: [place] CV LAB;  Service: Open Heart Surgery;  Laterality: N/A;   TEE WITHOUT CARDIOVERSION N/A 09/19/2019   Procedure: TRANSESOPHAGEAL ECHOCARDIOGRAM (TEE);  Surgeon: Larey Dresser, MD;  Location: [facility] ENDOSCOPY;  Service: Cardiovascular;  Laterality: N/A;   TRANSCATHETER AORTIC VALVE REPLACEMENT, TRANSFEMORAL N/A 05/08/2019   Procedure: TRANSCATHETER AORTIC VALVE REPLACEMENT, TRANSFEMORAL;  Surgeon: Burnell Blanks, MD;  Location: [place] CV LAB;  Service: Open Heart Surgery;  Laterality: N/A;    SOCIAL HISTORY: Social History   Socioeconomic History   Marital status: Married    Spouse name: Not on file   Number of children: 0   Years of education: Not on file   Highest education level: Not on file  Occupational History   Occupation: Retired Lobbyist: [employer]  Tobacco Use   Smoking status: Never   Smokeless tobacco: Never  Vaping Use   Vaping Use: Never used  Substance and Sexual Activity   Alcohol use: Not Currently   Drug use: No   Sexual activity: Yes  Other Topics Concern   Not on file  Social History Narrative   Patient lives in [place] w/ his wife. He is a native of [place]. 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   Right-handed   Caffeine: 2 cups coffee per day   Two story home   Social Determinants of Health   Financial Resource Strain: Not on file  Food Insecurity: Not on file  Transportation Needs: Not on file  Physical Activity: Not on file  Stress: Not on file  Social Connections: Not on file  Intimate Partner Violence: Not on file    FAMILY HISTORY: Family History  Problem Relation Age of Onset   Colon cancer Mother 88   Hypertension Other    Coronary artery disease Neg Hx    Colon polyps Neg Hx    Esophageal cancer Neg Hx    Rectal cancer Neg Hx    Stomach cancer Neg Hx     ALLERGIES:  is allergic to xarelto [rivaroxaban], corticosteroids, ramipril, zolpidem, and benazepril.  MEDICATIONS:  Current Outpatient Medications  Medication Sig Dispense Refill   amiodarone (PACERONE) 200 MG tablet Take 1 tablet (200 mg total) by mouth 2 (two) times daily.  60 tablet 3   aspirin 81 MG chewable tablet Chew 1 tablet (81 mg total) by mouth daily. 90 tablet 1   b complex vitamins tablet Take 1 tablet by mouth daily. 100 tablet 3   Calcium-Magnesium 500-250 MG TABS Take 1 tablet by mouth daily.     Carboxymethylcellul-Glycerin (LUBRICATING EYE DROPS OP) Place 1 drop into both eyes daily as needed (dry eyes).     Cholecalciferol (VITAMIN D) 50 MCG (2000 UT) tablet Take 2,000 Units by mouth daily.     diclofenac Sodium (VOLTAREN) 1 % GEL Apply 1 application topically 2 (two) times daily as needed (pain.).     ELIQUIS 5 MG TABS tablet TAKE 1 TABLET BY MOUTH TWICE A DAY 60 tablet 11   gabapentin (NEURONTIN) 300 MG capsule Take 1 capsule (300 mg total) by mouth at bedtime. 90 capsule 3   ipratropium (ATROVENT) 0.06 % nasal spray PLACE 2 SPRAYS INTO THE NOSE 3 (THREE) TIMES DAILY. 15 mL 2   lenalidomide (REVLIMID) 5 MG capsule Take 1 capsule (5 mg total) by mouth daily. Take 1 capsule (5 mg total) by mouth daily for 21 days then  take 7 days off 21 capsule 0   metoprolol succinate (TOPROL-XL) 25 MG 24 hr tablet TAKE 1 TABLET (25 MG TOTAL) BY MOUTH 2 (TWO) TIMES DAILY. NEEDS FOLLOW UP FOR MORE REFILLS 180 tablet 1   metroNIDAZOLE (METROCREAM) 0.75 % cream Apply 1 application topically 2 (two) times daily.     oxyCODONE (OXY IR/ROXICODONE) 5 MG immediate release tablet Take 1-2 tablets (5-10 mg total) by mouth 2 (two) times daily as needed for severe pain. 120 tablet 0   polyethylene glycol powder (GLYCOLAX/MIRALAX) 17 GM/SCOOP powder Take 17-34 g by mouth 2 (two) times daily as needed for moderate constipation. 500 g 5   senna-docusate (SENOKOT-S) 8.6-50 MG tablet Take 2 tablets by mouth at bedtime.     spironolactone (ALDACTONE) 25 MG tablet TAKE 1 TABLET BY MOUTH EVERY DAY 90 tablet 3   Vitamin D, Ergocalciferol, (DRISDOL) 1.25 MG (50000 UNIT) CAPS capsule TAKE 1 CAPSULE BY MOUTH ONE TIME PER WEEK 12 capsule 3   No current facility-administered medications for this visit.    REVIEW OF SYSTEMS:   .10 Point review of Systems was done is negative except as noted above. How is PHYSICAL EXAMINATION: There were no vitals filed for this visit.   Wt Readings from Last 3 Encounters:  06/21/22 177 lb (80.3 kg)  06/03/22 178 lb 12.8 oz (81.1 kg)  05/18/22 175 lb 9.6 oz (79.7 kg)   There is no height or weight on file to calculate BMI.    ECOG FS:2 - Symptomatic, <50% confined to bed . GENERAL:alert, in no acute distress and comfortable SKIN: no acute rashes, no significant lesions EYES: conjunctiva are pink and non-injected, sclera anicteric OROPHARYNX: MMM, no exudates, no oropharyngeal erythema or ulceration NECK: supple, no JVD LYMPH:  no palpable lymphadenopathy in the cervical, axillary or inguinal regions LUNGS: clear to auscultation b/l with normal respiratory effort HEART: regular rate & rhythm ABDOMEN:  normoactive bowel sounds , non tender, not distended. Extremity: no pedal edema PSYCH: alert & oriented  x 3 with fluent speech NEURO: no focal motor/sensory deficits    Exam performed in chair.  LABORATORY DATA:  I have reviewed the data as listed  .    Latest Ref Rng & Units 08/06/2022   12:14 PM 05/14/2022    1:37 PM 02/12/2022   12:13 PM  CBC  WBC 4.0 - 10.5 K/uL 5.6  5.0  4.3   Hemoglobin 13.0 - 17.0 g/dL 15.7  14.5  14.8   Hematocrit 39.0 - 52.0 % 47.6  43.7  44.1   Platelets 150 - 400 K/uL 125  120  155     .    Latest Ref Rng & Units 08/06/2022   12:14 PM 05/14/2022    1:37 PM 02/12/2022   12:13 PM  CMP  Glucose 70 - 99 mg/dL 113  99  119   BUN 8 - 23 mg/dL _0 Creatinine 0.61 - 1.24 mg/dL 1.24  1.16  1.05   Sodium 135 - 145 mmol/L 142  139  142   Potassium 3.5 - 5.1 mmol/L 3.9  4.3  4.3   Chloride 98 - 111 mmol/L 109  105  108   CO2 22 - 32 mmol/L _1 Calcium 8.9 - 10.3 mg/dL 9.0  9.4  9.6   Total Protein 6.5 - 8.1 g/dL  6.4  6.9   Total Bilirubin 0.3 - 1.2 mg/dL  0.7  0.5   Alkaline Phos 38 - 126 U/L  61  62   AST 15 - 41 U/L  34  34   ALT 0 - 44 U/L  47  41          05/24/2019 Bone Marrow Biopsy    04/16/2019 Surgical Pathology:   Surgical Pathology  CASE: [id number]  PATIENT: [facility]  Bone Marrow Report      Clinical History: Multiple myeloma, remission status unspecified ([place])  (BH)      DIAGNOSIS:   BONE MARROW, ASPIRATE, CLOT, CORE:  -Variably cellular bone marrow with trilineage hematopoiesis and 2%  plasma cells  -See comment   PERIPHERAL BLOOD:  -Slight thrombocytopenia   COMMENT:   The bone marrow is variably cellular with trilineage hematopoiesis  including abundant megakaryocytes in the more cellular areas.  In this  background, the plasma cells represent 2% of all cells in the aspirate  with interstitial cells and a few very minute clusters in the  clot/biopsy sections.  The latter in particular display kappa light  chain excess/restriction most suggestive of minimal residual plasma cell   neoplasm despite limited findings.  Correlation with cytogenetic and  FISH studies is recommended.    RADIOGRAPHIC STUDIES: I have personally reviewed the radiological images as listed and agreed with the findings in the report. DG Chest 2 View  Result Date: 08/06/2022 CLINICAL DATA:  Shortness of breath EXAM: CHEST - 2 VIEW COMPARISON:  01/07/2020 radiograph and 10/15/2021 PET CT FINDINGS: The cardiomediastinal silhouette is unremarkable. Cardiac valve replacement again noted. Mild bibasilar atelectasis/scarring again noted. There is no evidence of focal airspace disease, pulmonary edema, suspicious pulmonary nodule/mass, pleural effusion, or pneumothorax. No acute bony abnormalities are identified. IMPRESSION: No acute cardiopulmonary disease. Electronically Signed   By: Margarette Canada M.D.   On: 08/06/2022 12:32    ASSESSMENT & PLAN:   81 yo   #1 Plasma cell myeloma-currently in relapse  03/28/2019 MRI pelvis w/wo contrast revealed "1. Destructive bone lesions as detailed above. Findings most consistent with metastatic disease. PET-CT may be helpful for further evaluation and to establish a primary tumor. The right pelvic bone lesions should be amenable to image guided biopsy but a PET scan may demonstrate easier/safer biopsy sites. 2. No intrapelvic mass or adenopathy. 3. Benign intraosseous lipoma involving the left anterior superior acetabulum."  04/06/2019  PET whole body revealed "1. Diffuse osseous metastatic disease as detailed above without findings for a primary neoplasm in the chest, abdomen or pelvis. The large destructive lesion involving the right ischium should be amenable to image guided biopsy. 2. Two small retroperitoneal lymph nodes and 1 small right obturator node showing hypermetabolism."   04/16/2019 Posterior right pelvis bone biopsy revealed "PLASMA CELL NEOPLASM"  05/24/2019 Bone Marrow Biopsy revealed "BONE MARROW: - CELLULAR MARROW WITH INVOLVEMENT BY PLASMA CELL  NEOPLASM (20%) PERIPHERAL BLOOD: - MORPHOLOGICALLY UNREMARKABLE"  05/24/2019 FISH Panel revealed "ABNORMAL result with 11q+, 14q+ and +17"  #2 Severe aortic stenosis with bicuspid aortic valve -10/26/2018 ECHO revealed AVA at 0.8 cm2 and LV EF of 60-65% -05/08/2019 pt had a Transfemoral Transcatheter Aortic Valve Replacement  PET CT scan from 2/2 which shows There is persistent low level radiotracer uptake associated with the large lucent lesion within the superior right acetabulum. Additionally, there is a new focal area of increased radiotracer uptake above the background low level activity within this lesion with SUV max of 3.92. On the previous exam there was relatively homogeneous low level uptake within this lesion within SUV max of 2.43. Imaging findings are concerning for residual metabolically active tumor. Patient has seen radiation oncology since his last visit and they have decided to proceed with involved site radiation therapy to the FDG avid lesion to reduce the risk of fracture.  -He had his bone marrow biopsy on 11/03/2021 which shows 2% abnormal plasma cells.  PLAN: -Discussed labs from 08/09/2022 with the patient. Labs shows WBC of 5.5 K, hemoglobin of 14.4 K, and platelets of 125 K. CMP stable.  -Patient has no new symptoms suggestive of myeloma progression at this time. -Pt will continue revlimid, but hold on  zometa today.  -Continue Revlimid 5 mg p.o. daily 3 weeks on 1 week off..   -Continue Zometa q21month. -He was advised to continue to maintain hydration by drinking at least 48-64 oz per day. -He continues to take vitamin D 2000 units daily with 50k units 1x p.o weekly on Friday.  FOLLOW UP: RTC with Dr KIrene Limbowith labs and next dose of Zometa in 2 months  The total time spent in the appointment was 25 minutes* .  All of the patient's questions were answered with apparent satisfaction. The patient knows to call the clinic with any problems, questions or  concerns.   GSullivan LoneMD MS AAHIVMS SSutter Delta Medical CenterCPlatte Health CenterHematology/Oncology Physician [facility] .*Total Encounter Time as defined by the Centers for Medicare and Medicaid Services includes, in addition to the face-to-face time of a patient visit (documented in the note above) non-face-to-face time: obtaining and reviewing outside history, ordering and reviewing medications, tests or procedures, care coordination (communications with other health care professionals or caregivers) and documentation in the medical record.   I, PCleda Mccreedy am acting as a sEducation administratorfor GSullivan Lone MD. .I have reviewed the above documentation for accuracy and completeness, and I agree with the above. .Brunetta GeneraMD

## 2022-08-09 NOTE — Telephone Encounter (Signed)
Pt had DCCV on 11/14 and converted to NSR per ED notes.

## 2022-08-10 ENCOUNTER — Other Ambulatory Visit: Payer: Self-pay

## 2022-08-10 DIAGNOSIS — C9000 Multiple myeloma not having achieved remission: Secondary | ICD-10-CM

## 2022-08-10 LAB — KAPPA/LAMBDA LIGHT CHAINS
Kappa free light chain: 57.4 mg/L — ABNORMAL HIGH (ref 3.3–19.4)
Kappa, lambda light chain ratio: 4.86 — ABNORMAL HIGH (ref 0.26–1.65)
Lambda free light chains: 11.8 mg/L (ref 5.7–26.3)

## 2022-08-10 MED ORDER — LENALIDOMIDE 5 MG PO CAPS: 5.0000 mg | ORAL_CAPSULE | Freq: Every day | ORAL | 0 refills | Status: DC

## 2022-08-10 NOTE — Telephone Encounter (Signed)
Oral Oncology Patient Advocate Encounter   Submitted application for renewed assistance for Revlimid to [id number].   Application submitted via e-fax to [phone]   [facility] phone number [phone].   I will continue to check the status until final determination.   [name] [name], CPhT-Adv Oncology Pharmacy Patient [place] Direct Number: [phone]  Fax: [phone]

## 2022-08-11 ENCOUNTER — Other Ambulatory Visit (HOSPITAL_COMMUNITY): Payer: Self-pay

## 2022-08-11 LAB — MULTIPLE MYELOMA PANEL, SERUM
Albumin SerPl Elph-Mcnc: 3.8 g/dL (ref 2.9–4.4)
Albumin/Glob SerPl: 1.8 — ABNORMAL HIGH (ref 0.7–1.7)
Alpha 1: 0.2 g/dL (ref 0.0–0.4)
Alpha2 Glob SerPl Elph-Mcnc: 0.6 g/dL (ref 0.4–1.0)
B-Globulin SerPl Elph-Mcnc: 0.8 g/dL (ref 0.7–1.3)
Gamma Glob SerPl Elph-Mcnc: 0.6 g/dL (ref 0.4–1.8)
Globulin, Total: 2.2 g/dL (ref 2.2–3.9)
IgA: 101 mg/dL (ref 61–437)
IgG (Immunoglobin G), Serum: 577 mg/dL — ABNORMAL LOW (ref 603–1613)
IgM (Immunoglobulin M), Srm: 43 mg/dL (ref 15–143)
M Protein SerPl Elph-Mcnc: 0.2 g/dL — ABNORMAL HIGH
Total Protein ELP: 6 g/dL (ref 6.0–8.5)

## 2022-08-11 NOTE — Telephone Encounter (Signed)
Oral Oncology Patient Advocate Encounter   Received notification that prior authorization for Lenalidomide (Revlimid) is required for re-enrollment in [facility]   PA submitted on 11/29/203 [place]  PA must be completed by phone [phone]  Status is pending     Lady Deutscher, CPhT-Adv Oncology Pharmacy Patient [place] Direct Number: [phone]  Fax: [phone]

## 2022-08-12 ENCOUNTER — Other Ambulatory Visit (HOSPITAL_COMMUNITY): Payer: Self-pay

## 2022-08-13 ENCOUNTER — Other Ambulatory Visit (HOSPITAL_COMMUNITY): Payer: Self-pay

## 2022-08-13 ENCOUNTER — Encounter: Payer: Self-pay | Admitting: Hematology

## 2022-08-13 NOTE — Telephone Encounter (Signed)
Oral Oncology Patient Advocate Encounter  Prior Authorization for lenalidomide has been approved.    PA# [id number] Effective dates: 08/13/22 through 08/13/23  Patients co-pay is $3,396.90.    Lady Deutscher, CPhT-Adv Oncology Pharmacy Patient [place] Direct Number: [phone]  Fax: [phone]

## 2022-08-15 ENCOUNTER — Encounter: Payer: Self-pay | Admitting: Hematology

## 2022-08-23 NOTE — Progress Notes (Signed)
Date:  08/24/2022  ID:  [name] [name], DOB 07-03-1941, MRN [id number]  Provider location: [facility] Type of Visit: Established patient   PCP:  [name], [name], MD  Cardiologist: Dr. Aundra Dubin   HPI: [name] [name] is a 81 y.o. male who has history of HTN, aortic stenosis and paroxysmal atrial fibrillation. He was hospitalized with atrial fibrillation/RVR in 5/14.  He had TEE-guided cardioversion.  TEE showed bicuspid aortic valve with mild AS and a moderately dilated ascending aorta.  Most echo in 4/17 showed moderate aortic stenosis and MRA chest in 11/17 showed 4.1 cm ascending aorta.    He was on Xarelto for anticoagulation but stopped it as he was convinced it was causing joint pains.  He then refused to start any other anticoagulation at that time.     In 5/17, he had been back in atrial fibrillation for several days and was symptomatic.  I started him on Eliquis and planned TEE-guided DCCV given significant symptoms, but he converted back to NSR on his own.  He continued Eliquis for about 1 month then stopped it on his own.     In 11/17, he was hospitalized with symptomatic atrial fibrillation with RVR.  I started him on diltiazem CD and Eliquis with plan for TEE-guided DCCV.  However, he converted back to NSR on his own. He stopped the diltiazem but has continued the Eliquis.    He was admitted in 2/18 with fever, LUL PNA and left-sided pleural effusion.  Thoracentesis on left was suggestive of parapneumonic effusion.  He was in the hospital 11 days.  During that time, he went into atrial fibrillation with RVR.  He was started on amiodarone and went back into NSR.  Amiodarone was subsequently stopped.    Recurrent atrial fibrillation with RVR in 2/19, felt more fatigued.  He underwent TEE-guided DCCV back to NSR.    He had a lower GI bleed in 11/19 post-polypectomy.  He has since restarted on Eliquis.    Coronary CTA was done in 2/20, this showed mild  nonobstructive CAD.  Echo in 2/20 showed EF 60-65%, bicuspid aortic valve with severe AS.   LHC in 6/20 showed no significant coronary disease.  In 8/20, he had TAVR with [place] 3 THV x 2 (valve in valve due to peri-valvular leak initially).  Post-procedure echoes have shown elevated gradient across the aortic valve though dimensionless index has only been in the mildly stenotic range.  Last echo in 9/20 showed mean aortic valve gradient 26 mmHg with dimensionless index 0.54.   Patient has additionally been diagnosed with multiple myeloma.  He was treated with Revlimid, Velcade, and prednisone.   He was admitted early in 1/21 with atrial fibrillation/RVR associated with ischemic colitis.  He underwent TEE-guided DCCV to NSR.   He was readmitted later in 1/21 with PNA.    He fell in 4/21, tripped and fractured his left proximal humerus.  He had ORIF in 5/21.   In 7/21, he had a left inguinal hernia repair.  Echo in 8/21 showed EF 60-65%, normal RV, ascending aorta 45 mm, stable TAVR valve with mean gradient 15 mmHg.   Echo in 8/22 showed EF 60-65% RV ok.  Bioprosthetic AVR s/p TAVR with mean gradient 13 IVC small.   Echo in 9/23 showed EF 65-70% with normal RV, PASP 34, s/p TAVR with mean gradient up to 19 mmHg.  Gated CT was then done to look at the aortic  valve more closely, this showed 4.4 cm ascending aorta with small PFO, HALT at aortic valve leaflet bases without significant restriction, this was consistent with leaflet thrombosis.   Follow up 10/23, Dr. 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  He suggested transition to warfarin with INR 2.5-3.5, but he does not want to switch to warfarin.   He was in AF 08/01/22 and advised to start amiodarone 200 mg bid, per Dr. Aundra Dubin and follow up in clinic soon. He had worsening SOB and CP and was seen in ED 08/06/22. ECG showed AF with controlled rate. Underwent  DCCV with restoration to NSR.   Today he returns for post-ED evaluation follow up. Overall feeling fair. HR dropping in 40's and he is dizzy with this. He is mildly short of breath walking on flat ground. His legs are weak and attributes this to his neuropathy and multiple myeloma. Denies palpitations, CP, edema, or PND/Orthopnea. Appetite ok. No fever or chills. Weight at home 170-175 pounds. Taking all medications. He stopped amiodarone 08/09/22 after his HR and BP were dropping.   ECG (personally reviewed): SB 50 bpm  Labs (2/19): K 3.7, creatinine 0.83, hgb 16 Labs (11/19): LDL 82 Labs (1/20): TSH elevated but free T4 normal, K 4, creatinine 0.85, hgb 13.5 Labs (2/20): TSH mildly elevated but free T4 normal, K 4, creatinine 0.85 Labs (5/20): ESR 48 Labs (9/20): hgb 14.1, plts 38 => 82, K 3.3, creatinine 0.98 => 1.06 Labs (11/20): BNP 126.5 Labs (12/20): hgb 11.9, K 3.2, creatinine 0.87 Labs (1/21): K 4.8, creatinine 1.42, AST 30, ALT 60 Labs (4/21): K 4.5, creatinine 1.22, hgb 12.3, plts 122 Labs (7/21): hgb 13.7, K 4.6, creatinine 1.31, AST 36, ALT 76 Labs (3/22): K 3.7, creatinine 1.05, LFTs normal Labs (9/23): K 4.3, creatinine 1.16, hgb 14.5 Labs (11/23): K 4.1, creatinine 1.20   Allergies (verified):  No Known Drug Allergies    Past Medical History:  1. Hypertension: ACEI cough. Edema with amlodipine.  2. Atrial fibrillation. The patient had new-onset atrial fibrillation in November 2009. He underwent ibutilide cardioversion successfully. He has had 1-2 episodes/year that are short-lived that likely are atrial fibrillation with RVR. He was admitted in 5/14 with atrial fibrillation/RVR and had TEE-guided DCCV.  Atrial fibrillation again in 5/17, converted back to NSR spontaneously.  CHADSVASC score 2.  - Atrial fibrillation 2/19 with TEE-guided DCCV.  - 1/21 TEE-guided DCCV 3. Hypercholesterolemia.  4. Bicuspid aortic valve disorder: Echo (7/11): EF 55-60%, mild LV hypertrophy,  mild aortic stenosis with mean gradient 19 mmHg and peak gradient 36 mmHg.  TEE (5/14): EF 55%, mild LVH, bicuspid aortic valve with mild AS (mean gradient 13 mmHg), ascending aorta 4.5 cm.  Echo (9/15) with EF 65-70%, moderate AS (mean gradient 23 mmHg), mild AI, ascending aorta 4.1 cm, mild MR.   - Echo (4/17) with EF 65-70%, moderate aortic stenosis with mean gradient 27 mmHg, PASP 31 mmHg, ascending aorta 4.4 cm.  - Echo (2/18) with EF 55-60%, bicuspid aortic valve with moderate aortic stenosis (underestimated gradient).  - TEE (2/19): EF 60-65%, moderate LVH, bicuspid aortic valve with moderate AS with mean gradient 28 mmHg and AVA 1.2 cm^2, 4.4 cm ascending aorta.  - Echo (2/20): EF 60-65%, mild LVH, bicuspid aortic valve with severe AS (mean gradient 48 mmHg, AVA 0.8 cm^2).  - TAVR 8/20 with valve in valve Edwards Sapien 3 THVs (because of peri-valvular leak after initial valve placed).  -  Echo (9/20): EF > 65%, mild LVH, mild RV dilation with normal RV systolic function, mean aortic valve gradient 26 mmHg with dimensionless index 0.54, IVC normal.  - TEE (1/21): EF 60-65%, moderate LVH, normal RV, s/p valve-in-valve TAVR with mean gradient 8 mmHg and no PVL.  - Echo (8/21): EF 60-65%, normal RV, ascending aorta 45 mm, stable TAVR valve with mean gradient 15 mmHg.  - Echo (8/22): EF 70-75%, s/p TAVR mean gradient 12 mmHg.  - Echo (9/23): EF 65-70% with normal RV, PASP 34, s/p TAVR with mean gradient up to 19 mmHg. - Gated CT (9/23) showed 4.4 cm ascending aorta with small PFO, HALT at aortic valve leaflet bases without significant restriction, this was consistent with leaflet thrombosis. 5. Osteoarthritis.  6. Low back pain.  7. Chest pain: ETT-myoview (12/11) with 10:51 exercise, no chest pain, no significant ST changes, EF 69%, no evidence for ischemia or infarction.  - Coronary CTA (2/20): calcium score 41, nonobstructive CAD.  - LHC (6/20): No significant CAD.  8. Ascending aortic  aneurysm: Associated with bicuspid aortic valve.  4.5 cm by TEE in 5/14.   MRA chest (6/14) with bicuspid aortic valve, 4.3 cm ascending aortic aneurysm.  MRA chest (10/15) with 4.2 cm ascending aorta (bicuspid aortic valve noted).  Echo (4/17) with ascending aorta diameter 4.4 cm.  - MRA chest (11/17) with 4.1 cm ascending aorta.  - Echo (2/19): 4.4 cm ascending aorta.  - Coronary CTA in 2/20 showed 4.3 cm ascending aorta.  - Echo (8/21) with 4.5 cm ascending aorta.  - Gated CT chest (9/23): 4.4 cm ascending aorta 9. Colonic polyps: GI bleeding in 11/19 s/p polypectomy.  10. Multiple myeloma.  11. Bradycardia 12. Thrombocytopenia: likely related to chemotherapy for multiple myeloma.  13. PVCs: Zio monitor in 10/20 showed 9.4% PVCs.  14. Chronic diastolic CHF: RHC (76/16) with mean RA 3, PA 22/2, mean PCWP 6, CI 2.74 15. Left inguinal hernia: s/p repair.    Current Outpatient Medications  Medication Sig Dispense Refill   aspirin 81 MG chewable tablet Chew 1 tablet (81 mg total) by mouth daily. 90 tablet 1   b complex vitamins tablet Take 1 tablet by mouth daily. 100 tablet 3   Calcium-Magnesium 500-250 MG TABS Take 1 tablet by mouth daily.     Carboxymethylcellul-Glycerin (LUBRICATING EYE DROPS OP) Place 1 drop into both eyes daily as needed (dry eyes).     Cholecalciferol (VITAMIN D) 50 MCG (2000 UT) tablet Take 2,000 Units by mouth daily.     diclofenac Sodium (VOLTAREN) 1 % GEL Apply 1 application topically 2 (two) times daily as needed (pain.).     ELIQUIS 5 MG TABS tablet TAKE 1 TABLET BY MOUTH TWICE A DAY 60 tablet 11   gabapentin (NEURONTIN) 300 MG capsule Take 1 capsule (300 mg total) by mouth at bedtime. 90 capsule 3   ipratropium (ATROVENT) 0.06 % nasal spray PLACE 2 SPRAYS INTO THE NOSE 3 (THREE) TIMES DAILY. 15 mL 2   lenalidomide (REVLIMID) 5 MG capsule Take 1 capsule (5 mg total) by mouth daily. Take 1 capsule (5 mg total) by mouth daily for 21 days then take 7 days off 21  capsule 0   metoprolol succinate (TOPROL-XL) 25 MG 24 hr tablet TAKE 1 TABLET (25 MG TOTAL) BY MOUTH 2 (TWO) TIMES DAILY. NEEDS FOLLOW UP FOR MORE REFILLS 180 tablet 1   metroNIDAZOLE (METROCREAM) 0.75 % cream Apply 1 application topically 2 (two) times daily.  oxyCODONE (OXY IR/ROXICODONE) 5 MG immediate release tablet Take 1-2 tablets (5-10 mg total) by mouth 2 (two) times daily as needed for severe pain. 120 tablet 0   polyethylene glycol powder (GLYCOLAX/MIRALAX) 17 GM/SCOOP powder Take 17-34 g by mouth 2 (two) times daily as needed for moderate constipation. 500 g 5   senna-docusate (SENOKOT-S) 8.6-50 MG tablet Take 2 tablets by mouth at bedtime.     spironolactone (ALDACTONE) 25 MG tablet TAKE 1 TABLET BY MOUTH EVERY DAY 90 tablet 3   Vitamin D, Ergocalciferol, (DRISDOL) 1.25 MG (50000 UNIT) CAPS capsule TAKE 1 CAPSULE BY MOUTH ONE TIME PER WEEK 12 capsule 3   amiodarone (PACERONE) 200 MG tablet Take 1 tablet (200 mg total) by mouth 2 (two) times daily. (Patient not taking: Reported on 08/24/2022) 60 tablet 3   No current facility-administered medications for this encounter.   Allergies:  Xarelto [rivaroxaban], Corticosteroids, Ramipril, Zolpidem, and Benazepril   Social History:  The patient  reports that he has never smoked. He has never used smokeless tobacco. He reports that he does not currently use alcohol. He reports that he does not use drugs.   Family History:  The patient's family history includes Colon cancer (age of onset: 36) in his mother; Hypertension in an other family member.   ROS:  Please see the history of present illness.   All other systems are personally reviewed and negative.   Physical Exam:   BP 110/70   Pulse (!) 48   Wt 81.2 kg (179 lb)   SpO2 100%   BMI 25.68 kg/m  General:  NAD. No resp difficulty, walked into clinic. HEENT: Normal Neck: Supple. No JVD. Carotids 2+ bilat; no bruits. No lymphadenopathy or thryomegaly appreciated. Cor: PMI  nondisplaced. Regular rate & rhythm. No rubs, gallops, 2/6 SEM RUSB clear S2 Lungs: Clear Abdomen: Soft, nontender, nondistended. No hepatosplenomegaly. No bruits or masses. Good bowel sounds. Extremities: No cyanosis, clubbing, rash, edema Neuro: Alert & oriented x 3, cranial nerves grossly intact. Moves all 4 extremities w/o difficulty. Affect pleasant.  Recent Labs: 08/06/2022: B Natriuretic Peptide 229.8 08/09/2022: ALT 27; BUN 21; Creatinine 1.20; Hemoglobin 14.4; Platelet Count 125; Potassium 4.1; Sodium 140  Personally reviewed   Wt Readings from Last 3 Encounters:  08/24/22 81.2 kg (179 lb)  08/09/22 81.1 kg (178 lb 14.4 oz)  06/21/22 80.3 kg (177 lb)    ASSESSMENT AND PLAN: 1. Atrial fibrillation: Paroxysmal. He is quite symptomatic when in atrial fibrillation.  CHADSVASC = 3. Required DCCV 11/23 in ED. He has since stopped his amiodarone and is not interested in restarting. He is SB on ECG today, HR 50.  - Continue Eliquis. He declines labs today. Recent hgb 14.4 - He has not wanted atrial fibrillation ablation.   - Discussed placing 2 week Zio patch to quantify rhythm off amiodarone, he declines. 2. Peripheral neuropathy: Patient developed a very painful peripheral neuropathy on Velcade.  This is still very symptomatic.  3. Bradycardia: After TAVR, patient had RBBB and LAFB with HR down to the 40s.  RBBB has resolved, but HR 45-50's today.  - Decrease Toprol to 25 mg qhs 4. PVCs: Zio monitor in 10/20 showed 9.4% PVCs.  He is asymptomatic. 5. HTN: Stable.  - Continue spironolactone 25 mg daily. Declines labs today. Labs reviewed from 08/09/22 show K 4.1, creatinine 1.2 6. Bicuspid aortic valve disorder: S/P TAVR with 2 Edwards Sapien THVs (valve-in-valve due to peri-valvular leak after 1st valve placed). Post-op, mean gradient across the  aortic valve was elevated, 26 mmHg in 9/20.  Dimensionless index, in 9/20 0.54, only suggests mild bioprosthetic aortic stenosis.  Echo in 1/21  showed mean gradient down to 10 mmHg and TEE showed mean gradient 8 mmHg with no peri-valvular leakage.  Echo in 8/22 with mean gradient 12 mmHg.  Echo in 9/23 showed increase mean gradient up to 19 mmHg across bioprosthetic aortic valve.  Gated CT chest showed HALT at leaflet bases without restriction suggestive of leaflet thrombosis.  - Dr. Aundra Dubin recommended that he transition from apixaban to warfarin with INR 2.5-3.5. He does not want to take warfarin given need for monitoring and restriction on diet. Therefore, he will continue ASA 81 mg daily to Eliquis.  - Repeat echo in 6 months to make sure mean gradient is not progressing up.  7. Ascending aortic aneurysm: CT chest in 9/23 showed 4.4 cm ascending aorta.  - Follow aorta by echo for now, he would likely be a poor candidate for ascending aorta replacement given frailty and age.  8. Chronic diastolic CHF:  He is not volume overloaded on exam.  He does not take Lasix.  9. Multiple myeloma: He continues on Revlimid.     Follow up with Dr. Aundra Dubin as scheduled.  Signed, Rafael Bihari, FNP  08/24/2022  [facility] [address] [facility] [place] [address] [phone] (office) [phone] (fax)

## 2022-08-24 ENCOUNTER — Ambulatory Visit (HOSPITAL_COMMUNITY)
Admission: RE | Admit: 2022-08-24 | Discharge: 2022-08-24 | Disposition: A | Discharge status: Home / Self Care | Payer: Medicare Other | Source: Ambulatory Visit | Attending: Family Medicine | Admitting: Family Medicine

## 2022-08-24 ENCOUNTER — Encounter (HOSPITAL_COMMUNITY): Payer: Self-pay

## 2022-08-24 VITALS — BP 110/70 | HR 48 | Wt 179.0 lb

## 2022-08-24 DIAGNOSIS — I480 Paroxysmal atrial fibrillation: Secondary | ICD-10-CM | POA: Diagnosis not present

## 2022-08-24 DIAGNOSIS — I493 Ventricular premature depolarization: Secondary | ICD-10-CM

## 2022-08-24 DIAGNOSIS — G629 Polyneuropathy, unspecified: Secondary | ICD-10-CM

## 2022-08-24 DIAGNOSIS — Z952 Presence of prosthetic heart valve: Secondary | ICD-10-CM

## 2022-08-24 DIAGNOSIS — Z7961 Long term (current) use of immunomodulator: Secondary | ICD-10-CM | POA: Insufficient documentation

## 2022-08-24 DIAGNOSIS — Z7982 Long term (current) use of aspirin: Secondary | ICD-10-CM | POA: Diagnosis not present

## 2022-08-24 DIAGNOSIS — Z7901 Long term (current) use of anticoagulants: Secondary | ICD-10-CM | POA: Diagnosis not present

## 2022-08-24 DIAGNOSIS — I7121 Aneurysm of the ascending aorta, without rupture: Secondary | ICD-10-CM | POA: Diagnosis not present

## 2022-08-24 DIAGNOSIS — Z953 Presence of xenogenic heart valve: Secondary | ICD-10-CM | POA: Diagnosis not present

## 2022-08-24 DIAGNOSIS — I10 Essential (primary) hypertension: Secondary | ICD-10-CM | POA: Diagnosis not present

## 2022-08-24 DIAGNOSIS — Q231 Congenital insufficiency of aortic valve: Secondary | ICD-10-CM | POA: Insufficient documentation

## 2022-08-24 DIAGNOSIS — Z79899 Other long term (current) drug therapy: Secondary | ICD-10-CM | POA: Insufficient documentation

## 2022-08-24 DIAGNOSIS — I5032 Chronic diastolic (congestive) heart failure: Secondary | ICD-10-CM

## 2022-08-24 DIAGNOSIS — R001 Bradycardia, unspecified: Secondary | ICD-10-CM

## 2022-08-24 DIAGNOSIS — C9000 Multiple myeloma not having achieved remission: Secondary | ICD-10-CM

## 2022-08-24 DIAGNOSIS — I110 Hypertensive heart disease with heart failure: Secondary | ICD-10-CM | POA: Insufficient documentation

## 2022-08-24 MED ORDER — METOPROLOL SUCCINATE ER 25 MG PO TB24: 25.0000 mg | ORAL_TABLET | Freq: Every evening | ORAL | 1 refills | Status: DC

## 2022-08-24 NOTE — Patient Instructions (Signed)
Thank you for coming in today  No labs today  DECREASE Toprol to 25 mg 1 tablet nightly  Your physician recommends that you schedule a follow-up appointment in: keep follow up appointment with Dr. Aundra Dubin    Do the following things EVERYDAY: Weigh yourself in the morning before breakfast. Write it down and keep it in a log. Take your medicines as prescribed Eat low salt foods--Limit salt (sodium) to 2000 mg per day.  Stay as active as you can everyday Limit all fluids for the day to less than 2 liters  At the [facility], you and your health needs are our priority. As part of our continuing mission to provide you with exceptional heart care, we have created designated Provider Care Teams. These Care Teams include your primary Cardiologist (physician) and Advanced Practice Providers (APPs- Physician Assistants and Nurse Practitioners) who all work together to provide you with the care you need, when you need it.   You may see any of the following providers on your designated Care Team at your next follow up: Dr Glori Bickers Dr [name] Dr. Roxana Hires, NP Lyda Jester, [place] [facility] [place], [place] Forestine Na, NP Audry Riles, PharmD   Please be sure to bring in all your medications bottles to every appointment.   If you have any questions or concerns before your next appointment please send [place] a message through [place] or call our office at [phone].    TO LEAVE A MESSAGE FOR THE NURSE SELECT OPTION 2, PLEASE LEAVE A MESSAGE INCLUDING: YOUR NAME DATE OF BIRTH CALL BACK NUMBER REASON FOR CALL**this is important as we prioritize the call backs  YOU WILL RECEIVE A CALL BACK THE SAME DAY AS LONG AS YOU CALL BEFORE 4:00 PM

## 2022-09-03 ENCOUNTER — Other Ambulatory Visit: Payer: Self-pay

## 2022-09-03 DIAGNOSIS — C9000 Multiple myeloma not having achieved remission: Secondary | ICD-10-CM

## 2022-09-03 MED ORDER — LENALIDOMIDE 5 MG PO CAPS: 5.0000 mg | ORAL_CAPSULE | Freq: Every day | ORAL | 0 refills | Status: DC

## 2022-09-13 ENCOUNTER — Encounter: Payer: Self-pay | Admitting: Hematology

## 2022-09-20 ENCOUNTER — Ambulatory Visit (INDEPENDENT_AMBULATORY_CARE_PROVIDER_SITE_OTHER): Payer: Medicare Other

## 2022-09-20 ENCOUNTER — Encounter: Payer: Self-pay | Admitting: Hematology

## 2022-09-20 ENCOUNTER — Ambulatory Visit (INDEPENDENT_AMBULATORY_CARE_PROVIDER_SITE_OTHER): Payer: Medicare Other | Admitting: Internal Medicine

## 2022-09-20 ENCOUNTER — Encounter: Payer: Self-pay | Admitting: Internal Medicine

## 2022-09-20 VITALS — BP 120/68 | HR 64 | Temp 98.0°F | Ht 70.0 in | Wt 176.0 lb

## 2022-09-20 DIAGNOSIS — C9000 Multiple myeloma not having achieved remission: Secondary | ICD-10-CM | POA: Diagnosis not present

## 2022-09-20 DIAGNOSIS — I4891 Unspecified atrial fibrillation: Secondary | ICD-10-CM

## 2022-09-20 DIAGNOSIS — I2510 Atherosclerotic heart disease of native coronary artery without angina pectoris: Secondary | ICD-10-CM

## 2022-09-20 DIAGNOSIS — M545 Low back pain, unspecified: Secondary | ICD-10-CM

## 2022-09-20 DIAGNOSIS — I480 Paroxysmal atrial fibrillation: Secondary | ICD-10-CM

## 2022-09-20 DIAGNOSIS — I10 Essential (primary) hypertension: Secondary | ICD-10-CM | POA: Diagnosis not present

## 2022-09-20 DIAGNOSIS — G8929 Other chronic pain: Secondary | ICD-10-CM

## 2022-09-20 DIAGNOSIS — S22080A Wedge compression fracture of T11-T12 vertebra, initial encounter for closed fracture: Secondary | ICD-10-CM | POA: Diagnosis not present

## 2022-09-20 DIAGNOSIS — M546 Pain in thoracic spine: Secondary | ICD-10-CM

## 2022-09-20 MED ORDER — OXYCODONE HCL 5 MG PO TABS: 5.0000 mg | ORAL_TABLET | Freq: Two times a day (BID) | ORAL | 0 refills | Status: DC | PRN

## 2022-09-20 NOTE — Assessment & Plan Note (Signed)
Pt declined Statins

## 2022-09-20 NOTE — Assessment & Plan Note (Signed)
Cont on Toprol, spironolactone, potassium, amlodipine

## 2022-09-20 NOTE — Assessment & Plan Note (Signed)
On Metoprolol, Eliquis

## 2022-09-20 NOTE — Assessment & Plan Note (Signed)
S/p cardioversion in 07/2022 Cont on Eliquis

## 2022-09-20 NOTE — Assessment & Plan Note (Addendum)
New severe pain in the lower thoracic spine x 3 months X ray - r/o compression fx Oxycodone renewed  Potential benefits of a long term opioids use as well as potential risks (i.e. addiction risk, apnea etc) and complications (i.e. Somnolence, constipation and others) were explained to the patient and were aknowledged.

## 2022-09-20 NOTE — Assessment & Plan Note (Signed)
Oxy prn rare use

## 2022-09-20 NOTE — Assessment & Plan Note (Signed)
F/u w/Dr Irene Limbo

## 2022-09-20 NOTE — Assessment & Plan Note (Signed)
On Metoprolol, spironolactone

## 2022-09-20 NOTE — Progress Notes (Signed)
Subjective:  Patient ID: [name] [name], male    DOB: 22-Jan-1941  Age: 82 y.o. MRN: [id number]  CC: Follow-up   HPI [name] [name] presents for PAF, MM, chronic pain C/o severe pain in the lower thoracic spine  Outpatient Medications Prior to Visit  Medication Sig Dispense Refill   aspirin 81 MG chewable tablet Chew 1 tablet (81 mg total) by mouth daily. 90 tablet 1   b complex vitamins tablet Take 1 tablet by mouth daily. 100 tablet 3   Calcium-Magnesium 500-250 MG TABS Take 1 tablet by mouth daily.     Carboxymethylcellul-Glycerin (LUBRICATING EYE DROPS OP) Place 1 drop into both eyes daily as needed (dry eyes).     Cholecalciferol (VITAMIN D) 50 MCG (2000 UT) tablet Take 2,000 Units by mouth daily.     diclofenac Sodium (VOLTAREN) 1 % GEL Apply 1 application topically 2 (two) times daily as needed (pain.).     ELIQUIS 5 MG TABS tablet TAKE 1 TABLET BY MOUTH TWICE A DAY 60 tablet 11   gabapentin (NEURONTIN) 300 MG capsule Take 1 capsule (300 mg total) by mouth at bedtime. 90 capsule 3   ipratropium (ATROVENT) 0.06 % nasal spray PLACE 2 SPRAYS INTO THE NOSE 3 (THREE) TIMES DAILY. 15 mL 2   lenalidomide (REVLIMID) 5 MG capsule Take 1 capsule (5 mg total) by mouth daily. Take 1 capsule (5 mg total) by mouth daily for 21 days then take 7 days off 21 capsule 0   metoprolol succinate (TOPROL-XL) 25 MG 24 hr tablet Take 1 tablet (25 mg total) by mouth at bedtime. NEEDS FOLLOW UP FOR MORE REFILLS 180 tablet 1   metroNIDAZOLE (METROCREAM) 0.75 % cream Apply 1 application topically 2 (two) times daily.     polyethylene glycol powder (GLYCOLAX/MIRALAX) 17 GM/SCOOP powder Take 17-34 g by mouth 2 (two) times daily as needed for moderate constipation. 500 g 5   senna-docusate (SENOKOT-S) 8.6-50 MG tablet Take 2 tablets by mouth at bedtime.     spironolactone (ALDACTONE) 25 MG tablet TAKE 1 TABLET BY MOUTH EVERY DAY 90 tablet 3   Vitamin D, Ergocalciferol, (DRISDOL) 1.25 MG (50000 UNIT) CAPS capsule  TAKE 1 CAPSULE BY MOUTH ONE TIME PER WEEK 12 capsule 3   oxyCODONE (OXY IR/ROXICODONE) 5 MG immediate release tablet Take 1-2 tablets (5-10 mg total) by mouth 2 (two) times daily as needed for severe pain. 120 tablet 0   amiodarone (PACERONE) 200 MG tablet Take 1 tablet (200 mg total) by mouth 2 (two) times daily. (Patient not taking: Reported on 09/20/2022) 60 tablet 3   No facility-administered medications prior to visit.    ROS: Review of Systems  Constitutional:  Negative for appetite change, fatigue and unexpected weight change.  HENT:  Negative for congestion, nosebleeds, sneezing, sore throat and trouble swallowing.   Eyes:  Negative for itching and visual disturbance.  Respiratory:  Negative for cough.   Cardiovascular:  Positive for palpitations. Negative for chest pain and leg swelling.  Gastrointestinal:  Negative for abdominal distention, blood in stool, diarrhea and nausea.  Genitourinary:  Negative for frequency and hematuria.  Musculoskeletal:  Negative for back pain, gait problem, joint swelling and neck pain.  Skin:  Negative for rash.  Neurological:  Negative for dizziness, tremors, speech difficulty and weakness.  Psychiatric/Behavioral:  Negative for agitation, dysphoric mood and sleep disturbance. The patient is not nervous/anxious.     Objective:  BP 120/68 (BP Location: Left Arm, Patient Position: Sitting, Cuff Size: Normal)  Pulse 64   Temp 98 F (36.7 C) (Oral)   Ht '5\' 10"'$  (1.778 m)   Wt 176 lb (79.8 kg)   SpO2 99%   BMI 25.25 kg/m   BP Readings from Last 3 Encounters:  09/20/22 120/68  08/24/22 110/70  08/09/22 (!) 140/59    Wt Readings from Last 3 Encounters:  09/20/22 176 lb (79.8 kg)  08/24/22 179 lb (81.2 kg)  08/09/22 178 lb 14.4 oz (81.1 kg)    Physical Exam Constitutional:      General: He is not in acute distress.    Appearance: Normal appearance. He is well-developed.     Comments: NAD  Eyes:     Conjunctiva/sclera: Conjunctivae  normal.     Pupils: Pupils are equal, round, and reactive to light.  Neck:     Thyroid: No thyromegaly.     Vascular: No JVD.  Cardiovascular:     Rate and Rhythm: Normal rate and regular rhythm.     Heart sounds: Normal heart sounds. No murmur heard.    No friction rub. No gallop.  Pulmonary:     Effort: Pulmonary effort is normal. No respiratory distress.     Breath sounds: Normal breath sounds. No wheezing or rales.  Chest:     Chest wall: No tenderness.  Abdominal:     General: Bowel sounds are normal. There is no distension.     Palpations: Abdomen is soft. There is no mass.     Tenderness: There is no abdominal tenderness. There is no guarding or rebound.  Musculoskeletal:        General: No tenderness. Normal range of motion.     Cervical back: Normal range of motion.  Lymphadenopathy:     Cervical: No cervical adenopathy.  Skin:    General: Skin is warm and dry.     Findings: No rash.  Neurological:     Mental Status: He is alert and oriented to person, place, and time.     Cranial Nerves: No cranial nerve deficit.     Motor: No abnormal muscle tone.     Coordination: Coordination normal.     Gait: Gait normal.     Deep Tendon Reflexes: Reflexes are normal and symmetric.  Psychiatric:        Behavior: Behavior normal.        Thought Content: Thought content normal.        Judgment: Judgment normal.   No pain in the lower thoracic spine on palpation  Lab Results  Component Value Date   WBC 5.5 08/09/2022   HGB 14.4 08/09/2022   HCT 43.2 08/09/2022   PLT 125 (L) 08/09/2022   GLUCOSE 95 08/09/2022   CHOL 144 08/01/2018   TRIG 167.0 (H) 08/01/2018   HDL 28.20 (L) 08/01/2018   LDLDIRECT 151.5 11/20/2007   LDLCALC 82 08/01/2018   ALT 27 08/09/2022   AST 21 08/09/2022   NA 140 08/09/2022   K 4.1 08/09/2022   CL 105 08/09/2022   CREATININE 1.20 08/09/2022   BUN 21 08/09/2022   CO2 30 08/09/2022   TSH 5.338 (H) 06/23/2020   PSA 4.20 (H) 08/01/2018   INR  1.2 01/07/2020   HGBA1C 5.8 (H) 05/04/2019    No results found.  Assessment & Plan:   Problem List Items Addressed This Visit       Cardiovascular and Mediastinum   PAF (paroxysmal atrial fibrillation) ([place])    S/p cardioversion in 07/2022 Cont on Eliquis  Essential hypertension    On Metoprolol, spironolactone      CAD (coronary artery disease)    Pt declined Statins      Atrial fibrillation with RVR (HCC)    On Metoprolol, Eliquis        Other   Thoracic spine pain - Primary    New severe pain in the lower thoracic spine x 3 months X ray - r/o compression fx Oxycodone renewed  Potential benefits of a long term opioids use as well as potential risks (i.e. addiction risk, apnea etc) and complications (i.e. Somnolence, constipation and others) were explained to the patient and were aknowledged.       Relevant Medications   oxyCODONE (OXY IR/ROXICODONE) 5 MG immediate release tablet   Other Relevant Orders   DG Thoracic Spine 2 View   Multiple myeloma not having achieved remission (HCC)    F/u w/Dr Irene Limbo      Relevant Medications   oxyCODONE (OXY IR/ROXICODONE) 5 MG immediate release tablet   Chronic low back pain    Oxy prn rare use      Relevant Medications   oxyCODONE (OXY IR/ROXICODONE) 5 MG immediate release tablet   Other Visit Diagnoses     Primary hypertension             Meds ordered this encounter  Medications   oxyCODONE (OXY IR/ROXICODONE) 5 MG immediate release tablet    Sig: Take 1-2 tablets (5-10 mg total) by mouth 2 (two) times daily as needed for severe pain.    Dispense:  120 tablet    Refill:  0      Follow-up: Return in about 3 months (around 12/20/2022) for a follow-up visit.  [name], MD

## 2022-09-23 ENCOUNTER — Other Ambulatory Visit (HOSPITAL_COMMUNITY): Payer: Self-pay

## 2022-09-23 ENCOUNTER — Telehealth: Payer: Self-pay | Admitting: Pharmacy Technician

## 2022-09-23 NOTE — Telephone Encounter (Signed)
Oral Oncology Patient Advocate Encounter  Was successful in securing patient a $12,000 grant from [facility] to provide copayment coverage for lenalidomide.  This will keep the out of pocket expense at $0.     [employer] ID: [id number]  I have spoken with the patient.   The billing information is as follows and has been shared with [employer].    RxBin: [id number] PCN: [id number] Member ID: [id number] Group ID: [id number] Dates of Eligibility: 08/24/22 through 08/24/23  Fund:  [place], CPhT-Adv Oncology Pharmacy Patient [place] Direct Number: [phone]  Fax: [phone]

## 2022-10-01 ENCOUNTER — Other Ambulatory Visit: Payer: Self-pay

## 2022-10-01 DIAGNOSIS — C9000 Multiple myeloma not having achieved remission: Secondary | ICD-10-CM

## 2022-10-01 MED ORDER — LENALIDOMIDE 5 MG PO CAPS: 5.0000 mg | ORAL_CAPSULE | Freq: Every day | ORAL | 0 refills | Status: DC

## 2022-10-04 ENCOUNTER — Other Ambulatory Visit (HOSPITAL_COMMUNITY): Payer: Self-pay

## 2022-10-05 ENCOUNTER — Other Ambulatory Visit (HOSPITAL_COMMUNITY): Payer: Self-pay | Admitting: Cardiology

## 2022-10-08 ENCOUNTER — Other Ambulatory Visit: Payer: Self-pay | Admitting: *Deleted

## 2022-10-08 DIAGNOSIS — C9000 Multiple myeloma not having achieved remission: Secondary | ICD-10-CM

## 2022-10-11 ENCOUNTER — Inpatient Hospital Stay: Payer: Medicare Other | Attending: Hematology

## 2022-10-11 ENCOUNTER — Inpatient Hospital Stay: Payer: Medicare Other

## 2022-10-11 ENCOUNTER — Inpatient Hospital Stay (HOSPITAL_BASED_OUTPATIENT_CLINIC_OR_DEPARTMENT_OTHER): Payer: Medicare Other | Admitting: Hematology

## 2022-10-11 ENCOUNTER — Other Ambulatory Visit: Payer: Self-pay

## 2022-10-11 VITALS — BP 139/72 | HR 64 | Temp 97.9°F | Wt 176.5 lb

## 2022-10-11 DIAGNOSIS — C7951 Secondary malignant neoplasm of bone: Secondary | ICD-10-CM

## 2022-10-11 DIAGNOSIS — Z7189 Other specified counseling: Secondary | ICD-10-CM

## 2022-10-11 DIAGNOSIS — C9002 Multiple myeloma in relapse: Secondary | ICD-10-CM | POA: Insufficient documentation

## 2022-10-11 DIAGNOSIS — C9000 Multiple myeloma not having achieved remission: Secondary | ICD-10-CM

## 2022-10-11 LAB — CBC WITH DIFFERENTIAL (CANCER CENTER ONLY)
Abs Immature Granulocytes: 0.04 10*3/uL (ref 0.00–0.07)
Basophils Absolute: 0.1 10*3/uL (ref 0.0–0.1)
Basophils Relative: 1 %
Eosinophils Absolute: 0.4 10*3/uL (ref 0.0–0.5)
Eosinophils Relative: 9 %
HCT: 42.5 % (ref 39.0–52.0)
Hemoglobin: 14.2 g/dL (ref 13.0–17.0)
Immature Granulocytes: 1 %
Lymphocytes Relative: 21 %
Lymphs Abs: 0.8 10*3/uL (ref 0.7–4.0)
MCH: 30.8 pg (ref 26.0–34.0)
MCHC: 33.4 g/dL (ref 30.0–36.0)
MCV: 92.2 fL (ref 80.0–100.0)
Monocytes Absolute: 0.5 10*3/uL (ref 0.1–1.0)
Monocytes Relative: 12 %
Neutro Abs: 2.3 10*3/uL (ref 1.7–7.7)
Neutrophils Relative %: 56 %
Platelet Count: 106 10*3/uL — ABNORMAL LOW (ref 150–400)
RBC: 4.61 MIL/uL (ref 4.22–5.81)
RDW: 13.2 % (ref 11.5–15.5)
WBC Count: 4.1 10*3/uL (ref 4.0–10.5)
nRBC: 0 % (ref 0.0–0.2)

## 2022-10-11 LAB — CMP (CANCER CENTER ONLY)
ALT: 23 U/L (ref 0–44)
AST: 20 U/L (ref 15–41)
Albumin: 4 g/dL (ref 3.5–5.0)
Alkaline Phosphatase: 71 U/L (ref 38–126)
Anion gap: 6 (ref 5–15)
BUN: 19 mg/dL (ref 8–23)
CO2: 27 mmol/L (ref 22–32)
Calcium: 8.7 mg/dL — ABNORMAL LOW (ref 8.9–10.3)
Chloride: 107 mmol/L (ref 98–111)
Creatinine: 1.07 mg/dL (ref 0.61–1.24)
GFR, Estimated: >60 mL/min (ref >60)
Glucose, Bld: 88 mg/dL (ref 70–99)
Potassium: 4.1 mmol/L (ref 3.5–5.1)
Sodium: 140 mmol/L (ref 135–145)
Total Bilirubin: 0.5 mg/dL (ref 0.3–1.2)
Total Protein: 6.5 g/dL (ref 6.5–8.1)

## 2022-10-11 MED ORDER — SODIUM CHLORIDE 0.9 % IV SOLN: INTRAVENOUS | Status: DC

## 2022-10-11 MED ORDER — ZOLEDRONIC ACID 4 MG/100ML IV SOLN
4.0000 mg | Freq: Once | INTRAVENOUS | Status: AC
  Administered 2022-10-11: 4 mg via INTRAVENOUS
  Filled 2022-10-11: qty 100

## 2022-10-11 NOTE — Progress Notes (Signed)
HEMATOLOGY/ONCOLOGY CLINIC NOTE  Date of Service: 10/11/22    Patient Care Team: Cassandria Anger, MD as PCP - General Larey Dresser, MD as PCP - Advanced Heart Failure (Cardiology) Larey Dresser, MD as PCP - Cardiology (Cardiology) Erline Levine, MD as Attending Physician (Neurosurgery) Jarome Matin, MD as Consulting Physician (Dermatology) Rigoberto Noel, MD as Consulting Physician (Pulmonary Disease) Alda Berthold, DO as Consulting Physician (Neurology)   CHIEF COMPLAINTS/PURPOSE OF CONSULTATION:  Follow-up for continued evaluation and management of multiple myeloma  HISTORY OF PRESENTING ILLNESS:  Please see previous notes for details on initial presentation  INTERVAL HISTORY:  [name] [name] Is a 82 y.o. male is here for continued evaluation and management of multiple myeloma.  Patient was last seen by me on 08/09/2022 and he was doing well overall.   Patient reports he has been doing fairly well since our last visit. He complains of occasional bilateral leg pain due to neuropathy, which is unchanged. His breathing has been better since our last visit.   He reports that he had chest pain with low pulse rate around Thanksgiving, but is better now.   He regularly takes Revlimid 5 mg without any toxicities. He denies any toxicities with Zometa infusion. Patient does not want to increase his Gabapentin dosage right now.   Patient denies fever, chills, night sweats, unexpected weight loss, back pain, abdominal pain, bone pain, chest pain, or leg swelling.    MEDICAL HISTORY:  Past Medical History:  Diagnosis Date   Ascending aortic aneurysm (HCC)    Bicuspid aortic valve    Cancer (HCC)    CHF NYHA class I (no symptoms from ordinary activities), acute, diastolic ([place])    Dysrhythmia 2009   A fib   Fatty liver    mild   Fracture    left proximal humerus   GERD (gastroesophageal reflux disease)    GI bleeding 07/21/2018   post polypectomy   Hemorrhoids     HTN (hypertension)    Hypercholesteremia    Hypokalemia    Internal hemorrhoids    LBP (low back pain)    Moderate aortic stenosis    Osteoarthritis    Paroxysmal atrial fibrillation ([place])    a. new onset Afib in 07/2008. He underwent ibutilide cardioversion successfully. b. Recurrence 01/2013 s/p TEE/DCCV - was on Xarelto but he stopped it as he was convinced it was causing joint pn. c. Recurrence 01/2016 - spont conv to NSR. Pt took Eliquis x1 mo then declined further anticoag. d. Recurrence 07/2016.   Pneumonia    Tubular adenoma of colon     SURGICAL HISTORY: Past Surgical History:  Procedure Laterality Date   BACK SURGERY  x12 years ago   [place] VALVE REPLACEMENT  2020   CARDIOVERSION N/A 01/26/2013   Procedure: CARDIOVERSION;  Surgeon: Larey Dresser, MD;  Location: [place];  Service: Cardiovascular;  Laterality: N/A;   CARDIOVERSION N/A 10/28/2017   Procedure: CARDIOVERSION;  Surgeon: Larey Dresser, MD;  Location: [facility] ENDOSCOPY;  Service: Cardiovascular;  Laterality: N/A;   CARDIOVERSION N/A 03/03/2018   Procedure: CARDIOVERSION;  Surgeon: Lelon Perla, MD;  Location: [facility] ENDOSCOPY;  Service: Cardiovascular;  Laterality: N/A;   CARDIOVERSION N/A 09/19/2019   Procedure: CARDIOVERSION;  Surgeon: Larey Dresser, MD;  Location: [place];  Service: Cardiovascular;  Laterality: N/A;   COLONOSCOPY     COLONOSCOPY  07/17/2018   at [facility]  INGUINAL HERNIA REPAIR Left 03/18/2020   Procedure: OPEN LEFT INGUINAL HERNIA REPAIR;  Surgeon: Alphonsa Overall, MD;  Location: [place];  Service: General;  Laterality: Left;   LUMBAR LAMINECTOMY     ORIF HUMERUS FRACTURE Left 01/15/2020   Procedure: OPEN REDUCTION INTERNAL FIXATION (ORIF) PROXIMAL HUMERUS FRACTURE;  Surgeon: Nicholes Stairs, MD;  Location: [place];  Service: Orthopedics;  Laterality: Left;   POLYPECTOMY     RIGHT HEART CATH N/A 08/20/2019   Procedure: RIGHT HEART  CATH;  Surgeon: Larey Dresser, MD;  Location: [place] CV LAB;  Service: Cardiovascular;  Laterality: N/A;   RIGHT/LEFT HEART CATH AND CORONARY ANGIOGRAPHY N/A 03/07/2019   Procedure: RIGHT/LEFT HEART CATH AND CORONARY ANGIOGRAPHY;  Surgeon: Burnell Blanks, MD;  Location: [place] CV LAB;  Service: Cardiovascular;  Laterality: N/A;   TAVAR  04/2019   TEE WITHOUT CARDIOVERSION N/A 01/26/2013   Procedure: TRANSESOPHAGEAL ECHOCARDIOGRAM (TEE);  Surgeon: Larey Dresser, MD;  Location: [place];  Service: Cardiovascular;  Laterality: N/A;   TEE WITHOUT CARDIOVERSION N/A 10/28/2017   Procedure: TRANSESOPHAGEAL ECHOCARDIOGRAM (TEE);  Surgeon: Larey Dresser, MD;  Location: [facility] ENDOSCOPY;  Service: Cardiovascular;  Laterality: N/A;   TEE WITHOUT CARDIOVERSION N/A 05/08/2019   Procedure: TRANSESOPHAGEAL ECHOCARDIOGRAM (TEE);  Surgeon: Burnell Blanks, MD;  Location: [place] CV LAB;  Service: Open Heart Surgery;  Laterality: N/A;   TEE WITHOUT CARDIOVERSION N/A 09/19/2019   Procedure: TRANSESOPHAGEAL ECHOCARDIOGRAM (TEE);  Surgeon: Larey Dresser, MD;  Location[facility] ENDOSCOPY;  Service: Cardiovascular;  Laterality: N/A;   TRANSCATHETER AORTIC VALVE REPLACEMENT, TRANSFEMORAL N/A 05/08/2019   Procedure: TRANSCATHETER AORTIC VALVE REPLACEMENT, TRANSFEMORAL;  Surgeon: Burnell Blanks, MD;  Location: [place] CV LAB;  Service: Open Heart Surgery;  Laterality: N/A;    SOCIAL HISTORY: Social History   Socioeconomic History   Marital status: Married    Spouse name: Not on file   Number of children: 0   Years of education: Not on file   Highest education level: Not on file  Occupational History   Occupation: Retired Lobbyist: [employer]  Tobacco Use   Smoking status: Never   Smokeless tobacco: Never  Vaping Use   Vaping Use: Never used  Substance and Sexual Activity   Alcohol use: Not Currently   Drug use: No   Sexual activity: Yes   Other Topics Concern   Not on file  Social History Narrative   Patient lives in [place] w/ his wife. He is a native of [place]. 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   Right-handed   Caffeine: 2 cups coffee per day   Two story home   Social Determinants of Health   Financial Resource Strain: Not on file  Food Insecurity: Not on file  Transportation Needs: Not on file  Physical Activity: Not on file  Stress: Not on file  Social Connections: Not on file  Intimate Partner Violence: Not on file    FAMILY HISTORY: Family History  Problem Relation Age of Onset   Colon cancer Mother 93   Hypertension Other    Coronary artery disease Neg Hx    Colon polyps Neg Hx    Esophageal cancer Neg Hx    Rectal cancer Neg Hx    Stomach cancer Neg Hx     ALLERGIES:  is allergic to xarelto [rivaroxaban], corticosteroids, ramipril, zolpidem, and benazepril.  MEDICATIONS:  Current Outpatient Medications  Medication Sig Dispense Refill   amiodarone (PACERONE) 200 MG tablet Take 1 tablet (200 mg total) by mouth 2 (two) times daily. (Patient not taking: Reported on 09/20/2022) 60 tablet 3   aspirin 81 MG chewable tablet Chew 1 tablet (81 mg total) by mouth daily. 90 tablet 1   b complex vitamins tablet Take 1 tablet by mouth daily. 100 tablet 3   Calcium-Magnesium 500-250 MG TABS Take 1 tablet by mouth daily.     Carboxymethylcellul-Glycerin (LUBRICATING EYE DROPS OP) Place 1 drop into both eyes daily as needed (dry eyes).     Cholecalciferol (VITAMIN D) 50 MCG (2000 UT) tablet Take 2,000 Units by mouth daily.     diclofenac Sodium (VOLTAREN) 1 % GEL Apply 1 application topically 2 (two) times daily as needed (pain.).     ELIQUIS 5 MG TABS tablet TAKE 1 TABLET BY MOUTH TWICE A DAY 60 tablet 11   gabapentin (NEURONTIN) 300 MG capsule Take 1 capsule (300 mg total) by mouth at bedtime. 90 capsule 3   ipratropium (ATROVENT) 0.06 % nasal spray PLACE 2 SPRAYS  INTO THE NOSE 3 (THREE) TIMES DAILY. 15 mL 2   lenalidomide (REVLIMID) 5 MG capsule Take 1 capsule (5 mg total) by mouth daily. Take 1 capsule (5 mg total) by mouth daily for 21 days then take 7 days off 21 capsule 0   metoprolol succinate (TOPROL-XL) 25 MG 24 hr tablet Take 1 tablet (25 mg total) by mouth at bedtime. NEEDS FOLLOW UP FOR MORE REFILLS 180 tablet 1   metroNIDAZOLE (METROCREAM) 0.75 % cream Apply 1 application topically 2 (two) times daily.     oxyCODONE (OXY IR/ROXICODONE) 5 MG immediate release tablet Take 1-2 tablets (5-10 mg total) by mouth 2 (two) times daily as needed for severe pain. 120 tablet 0   polyethylene glycol powder (GLYCOLAX/MIRALAX) 17 GM/SCOOP powder Take 17-34 g by mouth 2 (two) times daily as needed for moderate constipation. 500 g 5   senna-docusate (SENOKOT-S) 8.6-50 MG tablet Take 2 tablets by mouth at bedtime.     spironolactone (ALDACTONE) 25 MG tablet TAKE 1 TABLET BY MOUTH EVERY DAY 90 tablet 3   Vitamin D, Ergocalciferol, (DRISDOL) 1.25 MG (50000 UNIT) CAPS capsule TAKE 1 CAPSULE BY MOUTH ONE TIME PER WEEK 12 capsule 3   No current facility-administered medications for this visit.    REVIEW OF SYSTEMS:   .10 Point review of Systems was done is negative except as noted above.  PHYSICAL EXAMINATION: Vitals:   10/11/22 1353  BP: 139/72  Pulse: 64  Temp: 97.9 F (36.6 C)  SpO2: 100%   Wt Readings from Last 3 Encounters:  09/20/22 176 lb (79.8 kg)  08/24/22 179 lb (81.2 kg)  08/09/22 178 lb 14.4 oz (81.1 kg)   Body mass index is 25.33 kg/m.    ECOG FS:2 - Symptomatic, <50% confined to bed . GENERAL:alert, in no acute distress and comfortable SKIN: no acute rashes, no significant lesions EYES: conjunctiva are pink and non-injected, sclera anicteric OROPHARYNX: MMM, no exudates, no oropharyngeal erythema or ulceration NECK: supple, no JVD LYMPH:  no palpable lymphadenopathy in the cervical, axillary or inguinal regions LUNGS: clear to  auscultation b/l with normal respiratory effort HEART: regular rate & rhythm ABDOMEN:  normoactive bowel sounds , non tender, not distended. Extremity: no pedal edema PSYCH: alert & oriented x 3 with fluent speech NEURO: no focal motor/sensory deficits    Exam performed in chair.  LABORATORY DATA:  I have reviewed the  data as listed  .    Latest Ref Rng & Units 10/11/2022    1:00 PM 08/09/2022    1:21 PM 08/06/2022   12:14 PM  CBC  WBC 4.0 - 10.5 K/uL 4.1  5.5  5.6   Hemoglobin 13.0 - 17.0 g/dL 14.2  14.4  15.7   Hematocrit 39.0 - 52.0 % 42.5  43.2  47.6   Platelets 150 - 400 K/uL 106  125  125     .    Latest Ref Rng & Units 10/11/2022    1:00 PM 08/09/2022    1:21 PM 08/06/2022   12:14 PM  CMP  Glucose 70 - 99 mg/dL 88  95  113   BUN 8 - 23 mg/dL '19  21  23   '$ Creatinine 0.61 - 1.24 mg/dL 1.07  1.20  1.24   Sodium 135 - 145 mmol/L 140  140  142   Potassium 3.5 - 5.1 mmol/L 4.1  4.1  3.9   Chloride 98 - 111 mmol/L 107  105  109   CO2 22 - 32 mmol/L '27  30  25   '$ Calcium 8.9 - 10.3 mg/dL 8.7  9.5  9.0   Total Protein 6.5 - 8.1 g/dL 6.5  6.6    Total Bilirubin 0.3 - 1.2 mg/dL 0.5  0.6    Alkaline Phos 38 - 126 U/L 71  66    AST 15 - 41 U/L 20  21    ALT 0 - 44 U/L 23  27           05/24/2019 Bone Marrow Biopsy    04/16/2019 Surgical Pathology:   Surgical Pathology  CASE: [id number]  PATIENT: [facility]  Bone Marrow Report      Clinical History: Multiple myeloma, remission status unspecified ([place])  (BH)      DIAGNOSIS:   BONE MARROW, ASPIRATE, CLOT, CORE:  -Variably cellular bone marrow with trilineage hematopoiesis and 2%  plasma cells  -See comment   PERIPHERAL BLOOD:  -Slight thrombocytopenia   COMMENT:   The bone marrow is variably cellular with trilineage hematopoiesis  including abundant megakaryocytes in the more cellular areas.  In this  background, the plasma cells represent 2% of all cells in the aspirate  with  interstitial cells and a few very minute clusters in the  clot/biopsy sections.  The latter in particular display kappa light  chain excess/restriction most suggestive of minimal residual plasma cell  neoplasm despite limited findings.  Correlation with cytogenetic and  FISH studies is recommended.    RADIOGRAPHIC STUDIES: I have personally reviewed the radiological images as listed and agreed with the findings in the report. DG Thoracic Spine 2 View  Result Date: 09/20/2022 CLINICAL DATA:  Severe pain in the lower thoracic spine for 3 months. Rule out compression fracture EXAM: THORACIC SPINE 2 VIEWS COMPARISON:  None Available. FINDINGS: Demineralization. Compression fracture of T11 inferior endplate with approximately 25-50% vertebral body height loss. This appears unchanged from CT abdomen and pelvis 09/14/2019. IMPRESSION: Chronic compression fracture of T11.  No evidence of acute fracture. Electronically Signed   By: Placido Sou M.D.   On: 09/20/2022 20:59    ASSESSMENT & PLAN:   82 yo   #1 Plasma cell myeloma-currently in relapse  03/28/2019 MRI pelvis w/wo contrast revealed "1. Destructive bone lesions as detailed above. Findings most consistent with metastatic disease. PET-CT may be helpful for further evaluation and to establish a primary tumor. The right pelvic bone lesions  should be amenable to image guided biopsy but a PET scan may demonstrate easier/safer biopsy sites. 2. No intrapelvic mass or adenopathy. 3. Benign intraosseous lipoma involving the left anterior superior acetabulum."  04/06/2019 PET whole body revealed "1. Diffuse osseous metastatic disease as detailed above without findings for a primary neoplasm in the chest, abdomen or pelvis. The large destructive lesion involving the right ischium should be amenable to image guided biopsy. 2. 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%) PERIPHERAL BLOOD: - MORPHOLOGICALLY UNREMARKABLE"  05/24/2019 FISH Panel revealed "ABNORMAL result with 11q+, 14q+ and +17"  #2 Severe aortic stenosis with bicuspid aortic valve -10/26/2018 ECHO revealed AVA at 0.8 cm2 and LV EF of 60-65% -05/08/2019 pt had a Transfemoral Transcatheter Aortic Valve Replacement  PET CT scan from 2/2 which shows There is persistent low level radiotracer uptake associated with the large lucent lesion within the superior right acetabulum. Additionally, there is a new focal area of increased radiotracer uptake above the background low level activity within this lesion with SUV max of 3.92. On the previous exam there was relatively homogeneous low level uptake within this lesion within SUV max of 2.43. Imaging findings are concerning for residual metabolically active tumor. Patient has seen radiation oncology since his last visit and they have decided to proceed with involved site radiation therapy to the FDG avid lesion to reduce the risk of fracture.  -He had his bone marrow biopsy on 11/03/2021 which shows 2% abnormal plasma cells.  PLAN: -Discussed lab results from today, 10/11/2022, with the patient. CBC shows decreased platelets of 106. CMP is stable.  -Myeloma panel from today -shows no M spike -Discussed the kappa/lambda results from 08/09/2022, which showed stable M-protein at 0.2.  -Answered all of patient's questions. -Patient has no new symptoms suggestive of myeloma progression at this time. -Pt will continue Revlimid 5 mg and zometa infusion.  -Continue Revlimid 5 mg p.o. daily 3 weeks on 1 week off. -Continue Zometa q17month.  FOLLOW UP: RTC with Dr KIrene Limbowith labs and next dose of Zometa in 3 months  The total time spent in the appointment was 25 minutes* .  All of the  patient's questions were answered with apparent satisfaction. The patient knows to call the clinic with any problems, questions or concerns.   GSullivan LoneMD MS AAHIVMS SThe Eye Clinic Surgery CenterCCountryside Surgery Center LtdHematology/Oncology Physician [facility]Story [facility] .*Total Encounter Time as defined by the Centers for Medicare and Medicaid Services includes, in addition to the face-to-face time of a patient visit (documented in the note above) non-face-to-face time: obtaining and reviewing outside history, ordering and reviewing medications, tests or procedures, care coordination (communications with other health care professionals or caregivers) and documentation in the medical record.   I, PCleda Mccreedy am acting as a sEducation administratorfor GSullivan Lone MD. .I have reviewed the above documentation for accuracy and completeness, and I agree with the above. .Brunetta GeneraMD

## 2022-10-11 NOTE — Progress Notes (Signed)
Patient seen by MD today  Vitals are within treatment parameters.  Labs reviewed: and are within treatment parameters."  Per physician team, patient is ready for treatment and there are NO modifications to the treatment plan.

## 2022-10-12 LAB — KAPPA/LAMBDA LIGHT CHAINS
Kappa free light chain: 71.3 mg/L — ABNORMAL HIGH (ref 3.3–19.4)
Kappa, lambda light chain ratio: 5.7 — ABNORMAL HIGH (ref 0.26–1.65)
Lambda free light chains: 12.5 mg/L (ref 5.7–26.3)

## 2022-10-14 LAB — MULTIPLE MYELOMA PANEL, SERUM
Albumin SerPl Elph-Mcnc: 3.8 g/dL (ref 2.9–4.4)
Albumin/Glob SerPl: 1.7 (ref 0.7–1.7)
Alpha 1: 0.3 g/dL (ref 0.0–0.4)
Alpha2 Glob SerPl Elph-Mcnc: 0.6 g/dL (ref 0.4–1.0)
B-Globulin SerPl Elph-Mcnc: 0.8 g/dL (ref 0.7–1.3)
Gamma Glob SerPl Elph-Mcnc: 0.7 g/dL (ref 0.4–1.8)
Globulin, Total: 2.3 g/dL (ref 2.2–3.9)
IgA: 108 mg/dL (ref 61–437)
IgG (Immunoglobin G), Serum: 644 mg/dL (ref 603–1613)
IgM (Immunoglobulin M), Srm: 39 mg/dL (ref 15–143)
Total Protein ELP: 6.1 g/dL (ref 6.0–8.5)

## 2022-10-15 ENCOUNTER — Telehealth: Payer: Self-pay

## 2022-10-15 NOTE — Telephone Encounter (Signed)
N/A unable to leave a message for patient to call back to schedule Medicare Annual Wellness Visit   Last AWV  08/24/13  Please schedule at anytime with [facility] if patient calls the office back.    30 Minutes appointment   Any questions, please call me at [phone]

## 2022-10-18 ENCOUNTER — Encounter: Payer: Self-pay | Admitting: Hematology

## 2022-10-28 ENCOUNTER — Other Ambulatory Visit: Payer: Self-pay

## 2022-10-28 DIAGNOSIS — C9000 Multiple myeloma not having achieved remission: Secondary | ICD-10-CM

## 2022-10-28 MED ORDER — LENALIDOMIDE 5 MG PO CAPS: 5.0000 mg | ORAL_CAPSULE | Freq: Every day | ORAL | 0 refills | Status: DC

## 2022-11-09 ENCOUNTER — Other Ambulatory Visit: Payer: Self-pay

## 2022-11-09 ENCOUNTER — Other Ambulatory Visit (HOSPITAL_COMMUNITY): Payer: Self-pay

## 2022-11-09 ENCOUNTER — Telehealth: Payer: Self-pay | Admitting: Pharmacy Technician

## 2022-11-09 DIAGNOSIS — C9000 Multiple myeloma not having achieved remission: Secondary | ICD-10-CM

## 2022-11-09 MED ORDER — LENALIDOMIDE 5 MG PO CAPS: 5.0000 mg | ORAL_CAPSULE | Freq: Every day | ORAL | 0 refills | Status: DC

## 2022-11-09 NOTE — Telephone Encounter (Signed)
Oral Oncology Patient Advocate Encounter   Received notification that prior authorization for Revlimid  is required.   PA submitted on 11/09/22 [place] Status is pending     Lady Deutscher, CPhT-Adv Oncology Pharmacy Patient [place] Direct Number: [phone]  Fax: [phone]

## 2022-11-10 ENCOUNTER — Other Ambulatory Visit (HOSPITAL_COMMUNITY): Payer: Self-pay

## 2022-11-10 NOTE — Telephone Encounter (Signed)
Oral Oncology Patient Advocate Encounter  Prior Authorization for Revlimid (brand) has been approved.    PA# [id number] s Effective dates: 11/09/22 through 11/10/22  Patients co-pay is $718.28.    [name] [name], CPhT-Adv Oncology Pharmacy Patient [place] Direct Number: [phone]  Fax: [phone]

## 2022-11-13 ENCOUNTER — Emergency Department (HOSPITAL_BASED_OUTPATIENT_CLINIC_OR_DEPARTMENT_OTHER): Payer: Medicare Other

## 2022-11-13 ENCOUNTER — Emergency Department (HOSPITAL_BASED_OUTPATIENT_CLINIC_OR_DEPARTMENT_OTHER)
Admission: EM | Admit: 2022-11-13 | Discharge: 2022-11-13 | Disposition: A | Discharge status: Home / Self Care | Payer: Medicare Other | Attending: Emergency Medicine | Admitting: Emergency Medicine

## 2022-11-13 ENCOUNTER — Encounter (HOSPITAL_BASED_OUTPATIENT_CLINIC_OR_DEPARTMENT_OTHER): Payer: Self-pay

## 2022-11-13 ENCOUNTER — Other Ambulatory Visit: Payer: Self-pay

## 2022-11-13 DIAGNOSIS — Z79899 Other long term (current) drug therapy: Secondary | ICD-10-CM | POA: Insufficient documentation

## 2022-11-13 DIAGNOSIS — Z7982 Long term (current) use of aspirin: Secondary | ICD-10-CM | POA: Diagnosis not present

## 2022-11-13 DIAGNOSIS — J189 Pneumonia, unspecified organism: Secondary | ICD-10-CM | POA: Diagnosis not present

## 2022-11-13 DIAGNOSIS — D72819 Decreased white blood cell count, unspecified: Secondary | ICD-10-CM | POA: Insufficient documentation

## 2022-11-13 DIAGNOSIS — Z7901 Long term (current) use of anticoagulants: Secondary | ICD-10-CM | POA: Diagnosis not present

## 2022-11-13 DIAGNOSIS — Z1152 Encounter for screening for COVID-19: Secondary | ICD-10-CM | POA: Insufficient documentation

## 2022-11-13 DIAGNOSIS — I10 Essential (primary) hypertension: Secondary | ICD-10-CM | POA: Insufficient documentation

## 2022-11-13 DIAGNOSIS — R059 Cough, unspecified: Secondary | ICD-10-CM | POA: Diagnosis not present

## 2022-11-13 DIAGNOSIS — R509 Fever, unspecified: Secondary | ICD-10-CM | POA: Diagnosis not present

## 2022-11-13 LAB — COMPREHENSIVE METABOLIC PANEL
ALT: 23 U/L (ref 0–44)
AST: 27 U/L (ref 15–41)
Albumin: 3.4 g/dL — ABNORMAL LOW (ref 3.5–5.0)
Alkaline Phosphatase: 55 U/L (ref 38–126)
Anion gap: 7 (ref 5–15)
BUN: 17 mg/dL (ref 8–23)
CO2: 23 mmol/L (ref 22–32)
Calcium: 7.8 mg/dL — ABNORMAL LOW (ref 8.9–10.3)
Chloride: 105 mmol/L (ref 98–111)
Creatinine, Ser: 0.97 mg/dL (ref 0.61–1.24)
GFR, Estimated: >60 mL/min (ref >60)
Glucose, Bld: 104 mg/dL — ABNORMAL HIGH (ref 70–99)
Potassium: 3.7 mmol/L (ref 3.5–5.1)
Sodium: 135 mmol/L (ref 135–145)
Total Bilirubin: 0.9 mg/dL (ref 0.3–1.2)
Total Protein: 6.4 g/dL — ABNORMAL LOW (ref 6.5–8.1)

## 2022-11-13 LAB — CBC WITH DIFFERENTIAL/PLATELET
Abs Immature Granulocytes: 0.02 10*3/uL (ref 0.00–0.07)
Basophils Absolute: 0 10*3/uL (ref 0.0–0.1)
Basophils Relative: 1 %
Eosinophils Absolute: 0.1 10*3/uL (ref 0.0–0.5)
Eosinophils Relative: 2 %
HCT: 40.7 % (ref 39.0–52.0)
Hemoglobin: 13.5 g/dL (ref 13.0–17.0)
Immature Granulocytes: 1 %
Lymphocytes Relative: 13 %
Lymphs Abs: 0.5 10*3/uL — ABNORMAL LOW (ref 0.7–4.0)
MCH: 30.2 pg (ref 26.0–34.0)
MCHC: 33.2 g/dL (ref 30.0–36.0)
MCV: 91.1 fL (ref 80.0–100.0)
Monocytes Absolute: 0.8 10*3/uL (ref 0.1–1.0)
Monocytes Relative: 22 %
Neutro Abs: 2.2 10*3/uL (ref 1.7–7.7)
Neutrophils Relative %: 61 %
Platelets: 123 10*3/uL — ABNORMAL LOW (ref 150–400)
RBC: 4.47 MIL/uL (ref 4.22–5.81)
RDW: 13.3 % (ref 11.5–15.5)
WBC: 3.5 10*3/uL — ABNORMAL LOW (ref 4.0–10.5)
nRBC: 0 % (ref 0.0–0.2)

## 2022-11-13 LAB — LACTIC ACID, PLASMA: Lactic Acid, Venous: 1.1 mmol/L (ref 0.5–1.9)

## 2022-11-13 LAB — PROTIME-INR
INR: 1.5 — ABNORMAL HIGH (ref 0.8–1.2)
Prothrombin Time: 17.5 seconds — ABNORMAL HIGH (ref 11.4–15.2)

## 2022-11-13 LAB — RESP PANEL BY RT-PCR (RSV, FLU A&B, COVID)  RVPGX2
Influenza A by PCR: NEGATIVE
Influenza B by PCR: NEGATIVE
Resp Syncytial Virus by PCR: NEGATIVE
SARS Coronavirus 2 by RT PCR: NEGATIVE

## 2022-11-13 LAB — APTT: aPTT: 39 seconds — ABNORMAL HIGH (ref 24–36)

## 2022-11-13 MED ORDER — BENZONATATE 100 MG PO CAPS
200.0000 mg | ORAL_CAPSULE | Freq: Once | ORAL | Status: AC
  Administered 2022-11-13: 200 mg via ORAL
  Filled 2022-11-13: qty 2

## 2022-11-13 MED ORDER — SODIUM CHLORIDE 0.9 % IV SOLN
2.0000 g | INTRAVENOUS | Status: DC
  Administered 2022-11-13: 2 g via INTRAVENOUS
  Filled 2022-11-13: qty 20

## 2022-11-13 MED ORDER — ACETAMINOPHEN 325 MG PO TABS
650.0000 mg | ORAL_TABLET | Freq: Once | ORAL | Status: AC
  Administered 2022-11-13: 650 mg via ORAL
  Filled 2022-11-13: qty 2

## 2022-11-13 MED ORDER — LACTATED RINGERS IV BOLUS (SEPSIS)
1000.0000 mL | Freq: Once | INTRAVENOUS | Status: AC
  Administered 2022-11-13: 1000 mL via INTRAVENOUS

## 2022-11-13 MED ORDER — AMOXICILLIN-POT CLAVULANATE 875-125 MG PO TABS: 1.0000 | ORAL_TABLET | Freq: Two times a day (BID) | ORAL | 0 refills | Status: DC

## 2022-11-13 MED ORDER — DOXYCYCLINE HYCLATE 100 MG PO CAPS: 100.0000 mg | ORAL_CAPSULE | Freq: Two times a day (BID) | ORAL | 0 refills | Status: DC

## 2022-11-13 MED ORDER — LACTATED RINGERS IV SOLN: INTRAVENOUS | Status: DC

## 2022-11-13 MED ORDER — SODIUM CHLORIDE 0.9 % IV SOLN
500.0000 mg | INTRAVENOUS | Status: DC
  Administered 2022-11-13: 500 mg via INTRAVENOUS
  Filled 2022-11-13: qty 5

## 2022-11-13 MED ORDER — BENZONATATE 100 MG PO CAPS: 100.0000 mg | ORAL_CAPSULE | Freq: Three times a day (TID) | ORAL | 0 refills | Status: DC

## 2022-11-13 NOTE — Discharge Instructions (Signed)
We suspect that most likely of a pneumonia. Take Tylenol for fevers.  Take the antibiotics that are prescribed starting tomorrow.  Please return to the ER if your symptoms worsen; you have increased shortness of breath, fevers, chills, inability to keep any medications down, confusion. Otherwise see the outpatient doctor as requested.

## 2022-11-13 NOTE — ED Notes (Addendum)
D/c paperwork reviewed with pt, including follow up care.  All questions and/or concerns addressed at time of d/c.  No further needs expressed. . Pt verbalized understanding, wheeled by family to ED exit, NAD.

## 2022-11-13 NOTE — ED Triage Notes (Signed)
Thursday afternoon started having fever, cough, vomiting. Hx multiple myeloma, on oral chemo.  Hasn't taken anything for fever today. Tmax 101.53f

## 2022-11-13 NOTE — Sepsis Progress Note (Signed)
Sepsis protocol is being followed by [facility]. 

## 2022-11-13 NOTE — ED Provider Notes (Signed)
[facility] [facility] Provider Note   CSN: [id number] Arrival date & time: 11/13/22  1724     History  Chief Complaint  Patient presents with   Fever    [name] [name] is a 82 y.o. male.  HPI     Pt comes in with cc of fevers for 2 days. Patient has history of A-fib, hypertension, hyperlipidemia, multiple myeloma.  Patient has been having fevers for the last 2 days with chills.  There is also a cough that is producing thick phlegm.  Patient is also having some body aches, sweats.  Patient denies any abdominal pain, UTI-like symptoms. Patient accompanied by his wife, who also indicates that patient has had pneumonia in the past requiring admission to the hospital.    Home Medications Prior to Admission medications   Medication Sig Start Date End Date Taking? Authorizing Provider  amoxicillin-clavulanate (AUGMENTIN) 875-125 MG tablet Take 1 tablet by mouth every 12 (twelve) hours. 11/13/22  Yes Varney Biles, MD  doxycycline (VIBRAMYCIN) 100 MG capsule Take 1 capsule (100 mg total) by mouth 2 (two) times daily. 11/13/22  Yes Varney Biles, MD  amiodarone (PACERONE) 200 MG tablet Take 1 tablet (200 mg total) by mouth 2 (two) times daily. Patient not taking: Reported on 09/20/2022 08/03/22   Larey Dresser, MD  aspirin 81 MG chewable tablet Chew 1 tablet (81 mg total) by mouth daily. 06/21/22   Larey Dresser, MD  b complex vitamins tablet Take 1 tablet by mouth daily. 09/25/19   [name], [name], MD  Calcium-Magnesium 500-250 MG TABS Take 1 tablet by mouth daily.    [provider]  Carboxymethylcellul-Glycerin (LUBRICATING EYE DROPS OP) Place 1 drop into both eyes daily as needed (dry eyes).    [provider]  Cholecalciferol (VITAMIN D) 50 MCG (2000 UT) tablet Take 2,000 Units by mouth daily.    [provider]  diclofenac Sodium (VOLTAREN) 1 % GEL Apply 1 application topically 2 (two) times daily as needed  (pain.).    [provider]  ELIQUIS 5 MG TABS tablet TAKE 1 TABLET BY MOUTH TWICE A DAY 03/10/22   Larey Dresser, MD  gabapentin (NEURONTIN) 300 MG capsule Take 1 capsule (300 mg total) by mouth at bedtime. 02/10/22   [name], [name], MD  ipratropium (ATROVENT) 0.06 % nasal spray PLACE 2 SPRAYS INTO THE NOSE 3 (THREE) TIMES DAILY. 07/30/22 07/30/23  [name], [name], MD  lenalidomide (REVLIMID) 5 MG capsule Take 1 capsule (5 mg total) by mouth daily. Take 1 capsule (5 mg total) by mouth daily for 21 days then take 7 days off 11/09/22   Brunetta Genera, MD  metoprolol succinate (TOPROL-XL) 25 MG 24 hr tablet Take 1 tablet (25 mg total) by mouth at bedtime. NEEDS FOLLOW UP FOR MORE REFILLS 08/24/22   Rafael Bihari, FNP  metroNIDAZOLE (METROCREAM) 0.75 % cream Apply 1 application topically 2 (two) times daily.    [provider]  oxyCODONE (OXY IR/ROXICODONE) 5 MG immediate release tablet Take 1-2 tablets (5-10 mg total) by mouth 2 (two) times daily as needed for severe pain. 09/20/22   [name], [name], MD  polyethylene glycol powder (GLYCOLAX/MIRALAX) 17 GM/SCOOP powder Take 17-34 g by mouth 2 (two) times daily as needed for moderate constipation. 09/25/19   [name], [name], MD  senna-docusate (SENOKOT-S) 8.6-50 MG tablet Take 2 tablets by mouth at bedtime.    [provider]  spironolactone (ALDACTONE) 25 MG  tablet TAKE 1 TABLET BY MOUTH EVERY DAY 10/05/22   Larey Dresser, MD  Vitamin D, Ergocalciferol, (DRISDOL) 1.25 MG (50000 UNIT) CAPS capsule TAKE 1 CAPSULE BY MOUTH ONE TIME PER WEEK 08/09/22   Brunetta Genera, MD      Allergies    Xarelto [rivaroxaban], Corticosteroids, Ramipril, Zolpidem, and Benazepril    Review of Systems   Review of Systems  All other systems reviewed and are negative.   Physical Exam Updated Vital Signs BP (!) 100/59   Pulse 66   Temp 98.7 F (37.1 C) (Oral)   Resp (!) 22   Ht '5\' 10"'$  (1.778 m)   Wt  75.8 kg   SpO2 96%   BMI 23.96 kg/m  Physical Exam Vitals and nursing note reviewed.  Constitutional:      Appearance: He is well-developed.  HENT:     Head: Atraumatic.  Cardiovascular:     Rate and Rhythm: Normal rate.  Pulmonary:     Effort: Pulmonary effort is normal.  Abdominal:     Tenderness: There is no abdominal tenderness.  Musculoskeletal:     Cervical back: Neck supple.  Skin:    General: Skin is warm.  Neurological:     Mental Status: He is alert and oriented to person, place, and time.     ED Results / Procedures / Treatments   Labs (all labs ordered are listed, but only abnormal results are displayed) Labs Reviewed  COMPREHENSIVE METABOLIC PANEL - Abnormal; Notable for the following components:      Result Value   Glucose, Bld 104 (*)    Calcium 7.8 (*)    Total Protein 6.4 (*)    Albumin 3.4 (*)    All other components within normal limits  CBC WITH DIFFERENTIAL/PLATELET - Abnormal; Notable for the following components:   WBC 3.5 (*)    Platelets 123 (*)    Lymphs Abs 0.5 (*)    All other components within normal limits  PROTIME-INR - Abnormal; Notable for the following components:   Prothrombin Time 17.5 (*)    INR 1.5 (*)    All other components within normal limits  APTT - Abnormal; Notable for the following components:   aPTT 39 (*)    All other components within normal limits  RESP PANEL BY RT-PCR (RSV, FLU A&B, COVID)  RVPGX2  CULTURE, BLOOD (ROUTINE X 2)  CULTURE, BLOOD (ROUTINE X 2)  LACTIC ACID, PLASMA  URINALYSIS, ROUTINE W REFLEX MICROSCOPIC    EKG EKG Interpretation  Date/Time:  Saturday November 13 2022 17:47:18 EST Ventricular Rate:  78 PR Interval:  195 QRS Duration: 105 QT Interval:  390 QTC Calculation: 445 R Axis:   -33 Text Interpretation: Sinus or ectopic atrial rhythm Left axis deviation Probable anteroseptal infarct, old No acute changes No significant change since last tracing Confirmed by Varney Biles [id number]) on  11/13/2022 9:37:24 PM  Radiology DG Chest Port 1 View  Result Date: 11/13/2022 CLINICAL DATA:  Cough and fever EXAM: PORTABLE CHEST 1 VIEW COMPARISON:  08/06/2022 FINDINGS: Cardiac silhouette is prominent. There is linear subsegmental atelectasis or scarring at the left base. No pneumothorax or pleural effusion. Normal pulmonary vasculature. Prosthetic aortic valve. Calcified aorta. IMPRESSION: Mildly prominent cardiac silhouette. Linear left basilar subsegmental atelectasis or scarring. Electronically Signed   By: Sammie Bench M.D.   On: 11/13/2022 18:15    Procedures Procedures    Medications Ordered in ED Medications  lactated ringers infusion (0 mLs Intravenous Hold 11/13/22 1828)  lactated ringers infusion ( Intravenous New Bag/Given 11/13/22 1944)  cefTRIAXone (ROCEPHIN) 2 g in sodium chloride 0.9 % 100 mL IVPB (0 g Intravenous Stopped 11/13/22 1936)  azithromycin (ZITHROMAX) 500 mg in sodium chloride 0.9 % 250 mL IVPB (0 mg Intravenous Stopped 11/13/22 2132)  lactated ringers bolus 1,000 mL (0 mLs Intravenous Stopped 11/13/22 1929)  acetaminophen (TYLENOL) tablet 650 mg (650 mg Oral Given 11/13/22 1826)    ED Course/ Medical Decision Making/ A&P                             Medical Decision Making Amount and/or Complexity of Data Reviewed Labs: ordered. Radiology: ordered. ECG/medicine tests: ordered.  Risk OTC drugs. Prescription drug management.   This patient presents to the ED with chief complaint(s) of fevers, cough, chills with pertinent past medical history of multiple myeloma, A-fib, aortic stenosis, AAA.The complaint involves an extensive differential diagnosis and also carries with it a high risk of complications and morbidity.    The differential diagnosis includes : Sepsis secondary to pneumonia, COVID-19 or flu. PE, pleural effusion, pulmonary edema also considered in the differential diagnosis.  The initial plan is to initiate sepsis screening labs.   Additional  history obtained: Additional history obtained from spouse Records reviewed Care Everywhere/External Records and outpatient cardiology and oncology notes  Independent labs interpretation:  The following labs were independently interpreted: Mild leukopenia, no left shift, no endorgan damage, lactic acid is also normal  Independent visualization and interpretation of imaging: - I independently visualized the following imaging with scope of interpretation limited to determining acute life threatening conditions related to emergency care: X-ray of the chest, which revealed evidence of questionable atelectasis versus early pneumonia.  Treatment and Reassessment: Patient received IV fluids in the ER.  He received IV antibiotics.  He also received antipyretic.  He feels a lot better now.  Heart rate has come down.  Fever has come down.  He is not tachypneic or in any respiratory distress.  At no point was he hypoxic.  Patient reassessed on 2 separate occasions.  His curb 65 score is 2.  He is comfortable going home.  His wife is also comfortable taking him home.  Return precautions have been discussed.  They will be started on Augmentin plus doxycycline.   Consideration for admission or further workup: Admission was considered.  However patient has been observed over long period of time and he has not deteriorated in the ER.  In fact he has improved.  He has responded to therapy.  COVID-19, flu test is negative.  Questionable pneumonia at this time.  Blood cultures have been sent.  He is reliable, has support system and comfortable going home.  Will return if he gets worse.  Final Clinical Impression(s) / ED Diagnoses Final diagnoses:  Community acquired pneumonia, unspecified laterality    Rx / DC Orders ED Discharge Orders          Ordered    amoxicillin-clavulanate (AUGMENTIN) 875-125 MG tablet  Every 12 hours        11/13/22 2240    doxycycline (VIBRAMYCIN) 100 MG capsule  2 times daily         11/13/22 2240              Varney Biles, MD 11/13/22 2243

## 2022-11-18 LAB — CULTURE, BLOOD (ROUTINE X 2)
Culture: NO GROWTH
Culture: NO GROWTH
Special Requests: ADEQUATE
Special Requests: ADEQUATE

## 2022-11-23 ENCOUNTER — Encounter: Payer: Self-pay | Admitting: Internal Medicine

## 2022-11-23 ENCOUNTER — Ambulatory Visit (INDEPENDENT_AMBULATORY_CARE_PROVIDER_SITE_OTHER): Payer: Medicare Other | Admitting: Internal Medicine

## 2022-11-23 VITALS — BP 110/60 | HR 60 | Temp 97.6°F | Ht 70.0 in | Wt 168.0 lb

## 2022-11-23 DIAGNOSIS — C9000 Multiple myeloma not having achieved remission: Secondary | ICD-10-CM | POA: Diagnosis not present

## 2022-11-23 DIAGNOSIS — J189 Pneumonia, unspecified organism: Secondary | ICD-10-CM

## 2022-11-23 DIAGNOSIS — I480 Paroxysmal atrial fibrillation: Secondary | ICD-10-CM | POA: Diagnosis not present

## 2022-11-23 DIAGNOSIS — I2510 Atherosclerotic heart disease of native coronary artery without angina pectoris: Secondary | ICD-10-CM | POA: Diagnosis not present

## 2022-11-23 MED ORDER — OXYCODONE HCL 5 MG PO TABS: 5.0000 mg | ORAL_TABLET | Freq: Two times a day (BID) | ORAL | 0 refills | Status: DC | PRN

## 2022-11-23 NOTE — Assessment & Plan Note (Signed)
Cont on Eliquis  

## 2022-11-23 NOTE — Assessment & Plan Note (Signed)
Not on tretment

## 2022-11-23 NOTE — Progress Notes (Signed)
Subjective:  Patient ID: [name] [name], male    DOB: 02-20-1941  Age: 82 y.o. MRN: [id number]  CC: Follow-up (Re-Check for pneumonia as pt has completed Abx and still has some what of a cough)   HPI [name] [name] presents for CAP - treated w/Augmentin on 11/13/2022  F/u LBP, MM  Outpatient Medications Prior to Visit  Medication Sig Dispense Refill   aspirin 81 MG chewable tablet Chew 1 tablet (81 mg total) by mouth daily. 90 tablet 1   b complex vitamins tablet Take 1 tablet by mouth daily. 100 tablet 3   Calcium-Magnesium 500-250 MG TABS Take 1 tablet by mouth daily.     Carboxymethylcellul-Glycerin (LUBRICATING EYE DROPS OP) Place 1 drop into both eyes daily as needed (dry eyes).     Cholecalciferol (VITAMIN D) 50 MCG (2000 UT) tablet Take 2,000 Units by mouth daily.     diclofenac Sodium (VOLTAREN) 1 % GEL Apply 1 application topically 2 (two) times daily as needed (pain.).     ELIQUIS 5 MG TABS tablet TAKE 1 TABLET BY MOUTH TWICE A DAY 60 tablet 11   gabapentin (NEURONTIN) 300 MG capsule Take 1 capsule (300 mg total) by mouth at bedtime. 90 capsule 3   ipratropium (ATROVENT) 0.06 % nasal spray PLACE 2 SPRAYS INTO THE NOSE 3 (THREE) TIMES DAILY. 15 mL 2   lenalidomide (REVLIMID) 5 MG capsule Take 1 capsule (5 mg total) by mouth daily. Take 1 capsule (5 mg total) by mouth daily for 21 days then take 7 days off 21 capsule 0   metoprolol succinate (TOPROL-XL) 25 MG 24 hr tablet Take 1 tablet (25 mg total) by mouth at bedtime. NEEDS FOLLOW UP FOR MORE REFILLS 180 tablet 1   metroNIDAZOLE (METROCREAM) 0.75 % cream Apply 1 application topically 2 (two) times daily.     polyethylene glycol powder (GLYCOLAX/MIRALAX) 17 GM/SCOOP powder Take 17-34 g by mouth 2 (two) times daily as needed for moderate constipation. 500 g 5   senna-docusate (SENOKOT-S) 8.6-50 MG tablet Take 2 tablets by mouth at bedtime.     spironolactone (ALDACTONE) 25 MG tablet TAKE 1 TABLET BY MOUTH EVERY DAY 90 tablet 3    Vitamin D, Ergocalciferol, (DRISDOL) 1.25 MG (50000 UNIT) CAPS capsule TAKE 1 CAPSULE BY MOUTH ONE TIME PER WEEK 12 capsule 3   oxyCODONE (OXY IR/ROXICODONE) 5 MG immediate release tablet Take 1-2 tablets (5-10 mg total) by mouth 2 (two) times daily as needed for severe pain. 120 tablet 0   amiodarone (PACERONE) 200 MG tablet Take 1 tablet (200 mg total) by mouth 2 (two) times daily. (Patient not taking: Reported on 09/20/2022) 60 tablet 3   amoxicillin-clavulanate (AUGMENTIN) 875-125 MG tablet Take 1 tablet by mouth every 12 (twelve) hours. 14 tablet 0   benzonatate (TESSALON) 100 MG capsule Take 1 capsule (100 mg total) by mouth every 8 (eight) hours. 21 capsule 0   doxycycline (VIBRAMYCIN) 100 MG capsule Take 1 capsule (100 mg total) by mouth 2 (two) times daily. 14 capsule 0   Facility-Administered Medications Prior to Visit  Medication Dose Route Frequency Provider Last Rate Last Admin   0.9 %  sodium chloride infusion   Intravenous Continuous Brunetta Genera, MD 20 mL/hr at 10/11/22 1515 New Bag at 10/11/22 1515    ROS: Review of Systems  Constitutional:  Negative for appetite change, fatigue and unexpected weight change.  HENT:  Negative for congestion, nosebleeds, sneezing, sore throat and trouble swallowing.   Eyes:  Negative for  itching and visual disturbance.  Respiratory:  Negative for cough.   Cardiovascular:  Negative for chest pain, palpitations and leg swelling.  Gastrointestinal:  Negative for abdominal distention, blood in stool, diarrhea and nausea.  Genitourinary:  Negative for frequency and hematuria.  Musculoskeletal:  Positive for back pain. Negative for gait problem, joint swelling and neck pain.  Skin:  Negative for rash.  Neurological:  Negative for dizziness, tremors, speech difficulty and weakness.  Psychiatric/Behavioral:  Negative for agitation, dysphoric mood and sleep disturbance. The patient is not nervous/anxious.     Objective:  BP 110/60 (BP  Location: Left Arm, Patient Position: Sitting, Cuff Size: Normal)   Pulse 60   Temp 97.6 F (36.4 C) (Oral)   Ht '5\' 10"'$  (1.778 m)   Wt 168 lb (76.2 kg)   SpO2 98%   BMI 24.11 kg/m   BP Readings from Last 3 Encounters:  11/23/22 110/60  11/13/22 102/61  10/11/22 139/72    Wt Readings from Last 3 Encounters:  11/23/22 168 lb (76.2 kg)  11/13/22 167 lb (75.8 kg)  10/11/22 176 lb 8 oz (80.1 kg)    Physical Exam Constitutional:      General: He is not in acute distress.    Appearance: He is well-developed.     Comments: NAD  Eyes:     Conjunctiva/sclera: Conjunctivae normal.     Pupils: Pupils are equal, round, and reactive to light.  Neck:     Thyroid: No thyromegaly.     Vascular: No JVD.  Cardiovascular:     Rate and Rhythm: Normal rate and regular rhythm.     Heart sounds: Normal heart sounds. No murmur heard.    No friction rub. No gallop.  Pulmonary:     Effort: Pulmonary effort is normal. No respiratory distress.     Breath sounds: Normal breath sounds. No wheezing or rales.  Chest:     Chest wall: No tenderness.  Abdominal:     General: Bowel sounds are normal. There is no distension.     Palpations: Abdomen is soft. There is no mass.     Tenderness: There is no abdominal tenderness. There is no guarding or rebound.  Musculoskeletal:        General: No tenderness. Normal range of motion.     Cervical back: Normal range of motion.  Lymphadenopathy:     Cervical: No cervical adenopathy.  Skin:    General: Skin is warm and dry.     Findings: No rash.  Neurological:     Mental Status: He is alert and oriented to person, place, and time.     Cranial Nerves: No cranial nerve deficit.     Motor: No abnormal muscle tone.     Coordination: Coordination normal.     Gait: Gait normal.     Deep Tendon Reflexes: Reflexes are normal and symmetric.  Psychiatric:        Behavior: Behavior normal.        Thought Content: Thought content normal.        Judgment:  Judgment normal.     Lab Results  Component Value Date   WBC 3.5 (L) 11/13/2022   HGB 13.5 11/13/2022   HCT 40.7 11/13/2022   PLT 123 (L) 11/13/2022   GLUCOSE 104 (H) 11/13/2022   CHOL 144 08/01/2018   TRIG 167.0 (H) 08/01/2018   HDL 28.20 (L) 08/01/2018   LDLDIRECT 151.5 11/20/2007   LDLCALC 82 08/01/2018   ALT 23 11/13/2022   AST  27 11/13/2022   NA 135 11/13/2022   K 3.7 11/13/2022   CL 105 11/13/2022   CREATININE 0.97 11/13/2022   BUN 17 11/13/2022   CO2 23 11/13/2022   TSH 5.338 (H) 06/23/2020   PSA 4.20 (H) 08/01/2018   INR 1.5 (H) 11/13/2022   HGBA1C 5.8 (H) 05/04/2019    DG Chest Port 1 View  Result Date: 11/13/2022 CLINICAL DATA:  Cough and fever EXAM: PORTABLE CHEST 1 VIEW COMPARISON:  08/06/2022 FINDINGS: Cardiac silhouette is prominent. There is linear subsegmental atelectasis or scarring at the left base. No pneumothorax or pleural effusion. Normal pulmonary vasculature. Prosthetic aortic valve. Calcified aorta. IMPRESSION: Mildly prominent cardiac silhouette. Linear left basilar subsegmental atelectasis or scarring. Electronically Signed   By: Sammie Bench M.D.   On: 11/13/2022 18:15    Assessment & Plan:   Problem List Items Addressed This Visit       Cardiovascular and Mediastinum   PAF (paroxysmal atrial fibrillation) ([place]) - Primary    Cont on Eliquis      CAD (coronary artery disease)    Not on tretment        Respiratory   PNA (pneumonia)     11/2022 s/p ER visit - treated w/Augmentin         Other   Multiple myeloma not having achieved remission ([facility])   Relevant Medications   oxyCODONE (OXY IR/ROXICODONE) 5 MG immediate release tablet      Meds ordered this encounter  Medications   oxyCODONE (OXY IR/ROXICODONE) 5 MG immediate release tablet    Sig: Take 1-2 tablets (5-10 mg total) by mouth 2 (two) times daily as needed for severe pain.    Dispense:  120 tablet    Refill:  0      Follow-up: Return in about 3 months (around  02/23/2023) for a follow-up visit.  [name], MD

## 2022-11-23 NOTE — Assessment & Plan Note (Signed)
11/2022 s/p ER visit - treated w/Augmentin

## 2022-12-02 ENCOUNTER — Other Ambulatory Visit: Payer: Self-pay | Admitting: Hematology

## 2022-12-02 DIAGNOSIS — C9000 Multiple myeloma not having achieved remission: Secondary | ICD-10-CM

## 2022-12-16 ENCOUNTER — Other Ambulatory Visit (HOSPITAL_COMMUNITY): Payer: Self-pay | Admitting: Cardiology

## 2022-12-17 ENCOUNTER — Telehealth (HOSPITAL_COMMUNITY): Payer: Self-pay

## 2022-12-17 ENCOUNTER — Other Ambulatory Visit (HOSPITAL_COMMUNITY): Payer: Self-pay

## 2022-12-17 NOTE — Telephone Encounter (Signed)
Patient's wife called and stated he has  been taking Metoprolol twice daily still. He never went down to the 1 daily as you suggested at last visit. She is stating he will not do 1 time daily. I wanted to check before refilling medication for twice daily.

## 2022-12-20 ENCOUNTER — Telehealth (HOSPITAL_COMMUNITY): Payer: Self-pay | Admitting: Cardiology

## 2022-12-20 MED ORDER — METOPROLOL SUCCINATE ER 25 MG PO TB24: 25.0000 mg | ORAL_TABLET | Freq: Two times a day (BID) | ORAL | 1 refills | Status: DC

## 2022-12-20 NOTE — Telephone Encounter (Signed)
Per Dr Shirlee Latch  Continue current dose Metoprolol 25 BID   Wife aware

## 2022-12-20 NOTE — Telephone Encounter (Signed)
Pts wife called again [phone] regarding metoprolol dose   Please advise

## 2022-12-24 ENCOUNTER — Encounter (HOSPITAL_COMMUNITY): Payer: Medicare Other | Admitting: Cardiology

## 2022-12-28 ENCOUNTER — Other Ambulatory Visit: Payer: Self-pay | Admitting: Hematology

## 2022-12-28 DIAGNOSIS — C9000 Multiple myeloma not having achieved remission: Secondary | ICD-10-CM

## 2022-12-29 ENCOUNTER — Other Ambulatory Visit: Payer: Self-pay

## 2022-12-29 ENCOUNTER — Encounter: Payer: Self-pay | Admitting: Hematology

## 2022-12-30 DIAGNOSIS — L905 Scar conditions and fibrosis of skin: Secondary | ICD-10-CM | POA: Diagnosis not present

## 2022-12-30 DIAGNOSIS — D224 Melanocytic nevi of scalp and neck: Secondary | ICD-10-CM | POA: Diagnosis not present

## 2022-12-30 DIAGNOSIS — L245 Irritant contact dermatitis due to other chemical products: Secondary | ICD-10-CM | POA: Diagnosis not present

## 2022-12-30 DIAGNOSIS — D485 Neoplasm of uncertain behavior of skin: Secondary | ICD-10-CM | POA: Diagnosis not present

## 2022-12-30 DIAGNOSIS — L821 Other seborrheic keratosis: Secondary | ICD-10-CM | POA: Diagnosis not present

## 2023-01-07 ENCOUNTER — Other Ambulatory Visit: Payer: Self-pay

## 2023-01-07 DIAGNOSIS — C9000 Multiple myeloma not having achieved remission: Secondary | ICD-10-CM

## 2023-01-10 ENCOUNTER — Inpatient Hospital Stay: Payer: Medicare Other | Attending: Hematology

## 2023-01-10 ENCOUNTER — Inpatient Hospital Stay (HOSPITAL_BASED_OUTPATIENT_CLINIC_OR_DEPARTMENT_OTHER): Payer: Medicare Other | Admitting: Hematology

## 2023-01-10 ENCOUNTER — Inpatient Hospital Stay: Payer: Medicare Other

## 2023-01-10 ENCOUNTER — Other Ambulatory Visit: Payer: Self-pay

## 2023-01-10 VITALS — BP 143/69 | HR 57 | Temp 97.9°F | Resp 18 | Wt 174.4 lb

## 2023-01-10 DIAGNOSIS — D72819 Decreased white blood cell count, unspecified: Secondary | ICD-10-CM | POA: Diagnosis not present

## 2023-01-10 DIAGNOSIS — D696 Thrombocytopenia, unspecified: Secondary | ICD-10-CM | POA: Diagnosis not present

## 2023-01-10 DIAGNOSIS — C9000 Multiple myeloma not having achieved remission: Secondary | ICD-10-CM

## 2023-01-10 DIAGNOSIS — C7951 Secondary malignant neoplasm of bone: Secondary | ICD-10-CM

## 2023-01-10 DIAGNOSIS — G629 Polyneuropathy, unspecified: Secondary | ICD-10-CM

## 2023-01-10 DIAGNOSIS — C9002 Multiple myeloma in relapse: Secondary | ICD-10-CM | POA: Diagnosis not present

## 2023-01-10 DIAGNOSIS — Z7189 Other specified counseling: Secondary | ICD-10-CM

## 2023-01-10 DIAGNOSIS — G620 Drug-induced polyneuropathy: Secondary | ICD-10-CM | POA: Insufficient documentation

## 2023-01-10 LAB — CBC WITH DIFFERENTIAL (CANCER CENTER ONLY)
Abs Immature Granulocytes: 0.01 10*3/uL (ref 0.00–0.07)
Basophils Absolute: 0.1 10*3/uL (ref 0.0–0.1)
Basophils Relative: 2 %
Eosinophils Absolute: 0.2 10*3/uL (ref 0.0–0.5)
Eosinophils Relative: 6 %
HCT: 42.8 % (ref 39.0–52.0)
Hemoglobin: 14.1 g/dL (ref 13.0–17.0)
Immature Granulocytes: 0 %
Lymphocytes Relative: 32 %
Lymphs Abs: 1 10*3/uL (ref 0.7–4.0)
MCH: 30.9 pg (ref 26.0–34.0)
MCHC: 32.9 g/dL (ref 30.0–36.0)
MCV: 93.9 fL (ref 80.0–100.0)
Monocytes Absolute: 0.4 10*3/uL (ref 0.1–1.0)
Monocytes Relative: 12 %
Neutro Abs: 1.6 10*3/uL — ABNORMAL LOW (ref 1.7–7.7)
Neutrophils Relative %: 48 %
Platelet Count: 121 10*3/uL — ABNORMAL LOW (ref 150–400)
RBC: 4.56 MIL/uL (ref 4.22–5.81)
RDW: 14 % (ref 11.5–15.5)
WBC Count: 3.2 10*3/uL — ABNORMAL LOW (ref 4.0–10.5)
nRBC: 0 % (ref 0.0–0.2)

## 2023-01-10 LAB — CMP (CANCER CENTER ONLY)
ALT: 25 U/L (ref 0–44)
AST: 25 U/L (ref 15–41)
Albumin: 4.3 g/dL (ref 3.5–5.0)
Alkaline Phosphatase: 60 U/L (ref 38–126)
Anion gap: 7 (ref 5–15)
BUN: 23 mg/dL (ref 8–23)
CO2: 26 mmol/L (ref 22–32)
Calcium: 9.4 mg/dL (ref 8.9–10.3)
Chloride: 108 mmol/L (ref 98–111)
Creatinine: 1.06 mg/dL (ref 0.61–1.24)
GFR, Estimated: >60 mL/min (ref >60)
Glucose, Bld: 92 mg/dL (ref 70–99)
Potassium: 4.2 mmol/L (ref 3.5–5.1)
Sodium: 141 mmol/L (ref 135–145)
Total Bilirubin: 0.5 mg/dL (ref 0.3–1.2)
Total Protein: 6.4 g/dL — ABNORMAL LOW (ref 6.5–8.1)

## 2023-01-10 MED ORDER — SODIUM CHLORIDE 0.9 % IV SOLN: Freq: Once | INTRAVENOUS | Status: AC

## 2023-01-10 MED ORDER — ZOLEDRONIC ACID 4 MG/100ML IV SOLN
4.0000 mg | Freq: Once | INTRAVENOUS | Status: AC
  Administered 2023-01-10: 4 mg via INTRAVENOUS
  Filled 2023-01-10: qty 100

## 2023-01-10 NOTE — Patient Instructions (Signed)

## 2023-01-10 NOTE — Progress Notes (Signed)
HEMATOLOGY/ONCOLOGY CLINIC NOTE  Date of Service: 01/10/23    Patient Care Team: Tresa Garter, MD as PCP - General Laurey Morale, MD as PCP - Advanced Heart Failure (Cardiology) Laurey Morale, MD as PCP - Cardiology (Cardiology) Maeola Harman, MD as Attending Physician (Neurosurgery) Donzetta Starch, MD as Consulting Physician (Dermatology) Oretha Milch, MD as Consulting Physician (Pulmonary Disease) Glendale Chard, DO as Consulting Physician (Neurology)   CHIEF COMPLAINTS/PURPOSE OF CONSULTATION:  Follow-up for continued evaluation and management of multiple myeloma  HISTORY OF PRESENTING ILLNESS:  Please see previous notes for details on initial presentation  INTERVAL HISTORY:  [name] [name] Is a 82 y.o. male is here for continued evaluation and management of multiple myeloma.  Patient was last seen by me on 10/11/2022 and he complained of occasional bilateral leg pain due to neuropathy, but was doing well overall.   Patient notes he has been doing fairly well since our last visit. Patient was admitted to the ED on 11/13/2022 due to fever and cough with thick phlegm. He was diagnosed with pneumonia and he was treated with antibiotics.   During today's visit, patient complains of continuous bilateral leg pain and bilateral leg/hand cramps due to neuropathy. He notes that his symptoms occasionally causes him walking problems.   Patient also complains of abnormal sensation near back of neck. He denies any rash near the area.    He denies fever, chills, night sweats, unexpected weight loss, back pain, abdominal pain, chest pain, bone pain, or leg swelling.   Patient notes he regularly takes Revlimid 5 mg without any toxicities. He denies any toxicities with Zometa infusion.   He is complaint with all of his medications. Patient needs oxycodone and Gabapentin refilled.   Patient regularly follows up with his cardiologist.    MEDICAL HISTORY:  Past Medical  History:  Diagnosis Date   Ascending aortic aneurysm (HCC)    Bicuspid aortic valve    Cancer (HCC)    CHF NYHA class I (no symptoms from ordinary activities), acute, diastolic (HCC)    Dysrhythmia 1610   A fib   Fatty liver    mild   Fracture    left proximal humerus   GERD (gastroesophageal reflux disease)    GI bleeding 07/21/2018   post polypectomy   Hemorrhoids    HTN (hypertension)    Hypercholesteremia    Hypokalemia    Internal hemorrhoids    LBP (low back pain)    Moderate aortic stenosis    Osteoarthritis    Paroxysmal atrial fibrillation (HCC)    a. new onset Afib in 07/2008. He underwent ibutilide cardioversion successfully. b. Recurrence 01/2013 s/p TEE/DCCV - was on Xarelto but he stopped it as he was convinced it was causing joint pn. c. Recurrence 01/2016 - spont conv to NSR. Pt took Eliquis x1 mo then declined further anticoag. d. Recurrence 07/2016.   Pneumonia    Tubular adenoma of colon     SURGICAL HISTORY: Past Surgical History:  Procedure Laterality Date   BACK SURGERY  x12 years ago   CARDIAC CATHETERIZATION  2020   CARDIAC VALVE REPLACEMENT  2020   CARDIOVERSION N/A 01/26/2013   Procedure: CARDIOVERSION;  Surgeon: Laurey Morale, MD;  Location: [facility] ENDOSCOPY;  Service: Cardiovascular;  Laterality: N/A;   CARDIOVERSION N/A 10/28/2017   Procedure: CARDIOVERSION;  Surgeon: Laurey Morale, MD;  Location: [facility] ENDOSCOPY;  Service: Cardiovascular;  Laterality: N/A;   CARDIOVERSION N/A 03/03/2018   Procedure: CARDIOVERSION;  Surgeon: Lewayne Bunting, MD;  Location: [facility] ENDOSCOPY;  Service: Cardiovascular;  Laterality: N/A;   CARDIOVERSION N/A 09/19/2019   Procedure: CARDIOVERSION;  Surgeon: Laurey Morale, MD;  Location: [facility] ENDOSCOPY;  Service: Cardiovascular;  Laterality: N/A;   COLONOSCOPY     COLONOSCOPY  07/17/2018   at [facility]   HEMORRHOID SURGERY     INGUINAL HERNIA REPAIR Left 03/18/2020   Procedure: OPEN LEFT INGUINAL HERNIA REPAIR;  Surgeon: Ovidio Kin, MD;  Location: [facility] OR;  Service: General;  Laterality: Left;   LUMBAR LAMINECTOMY     ORIF HUMERUS FRACTURE Left 01/15/2020   Procedure: OPEN REDUCTION INTERNAL FIXATION (ORIF) PROXIMAL HUMERUS FRACTURE;  Surgeon: Yolonda Kida, MD;  Location: [facility] OR;  Service: Orthopedics;  Laterality: Left;   POLYPECTOMY     RIGHT HEART CATH N/A 08/20/2019   Procedure: RIGHT HEART CATH;  Surgeon: Laurey Morale, MD;  Location: [facility] INVASIVE CV LAB;  Service: Cardiovascular;  Laterality: N/A;   RIGHT/LEFT HEART CATH AND CORONARY ANGIOGRAPHY N/A 03/07/2019   Procedure: RIGHT/LEFT HEART CATH AND CORONARY ANGIOGRAPHY;  Surgeon: Kathleene Hazel, MD;  Location: [facility] INVASIVE CV LAB;  Service: Cardiovascular;  Laterality: N/A;   TAVAR  04/2019   TEE WITHOUT CARDIOVERSION N/A 01/26/2013   Procedure: TRANSESOPHAGEAL ECHOCARDIOGRAM (TEE);  Surgeon: Laurey Morale, MD;  Location: [facility] ENDOSCOPY;  Service: Cardiovascular;  Laterality: N/A;   TEE WITHOUT CARDIOVERSION N/A 10/28/2017   Procedure: TRANSESOPHAGEAL ECHOCARDIOGRAM (TEE);  Surgeon: Laurey Morale, MD;  Location: [facility] ENDOSCOPY;  Service: Cardiovascular;  Laterality: N/A;   TEE WITHOUT CARDIOVERSION N/A 05/08/2019   Procedure: TRANSESOPHAGEAL ECHOCARDIOGRAM (TEE);  Surgeon: Kathleene Hazel, MD;  Location: [facility] INVASIVE CV LAB;  Service: Open Heart Surgery;  Laterality: N/A;   TEE WITHOUT CARDIOVERSION N/A 09/19/2019   Procedure: TRANSESOPHAGEAL ECHOCARDIOGRAM (TEE);  Surgeon: Laurey Morale, MD;  Location: [facility] ENDOSCOPY;  Service: Cardiovascular;  Laterality: N/A;   TRANSCATHETER AORTIC VALVE REPLACEMENT, TRANSFEMORAL N/A 05/08/2019   Procedure: TRANSCATHETER AORTIC VALVE REPLACEMENT, TRANSFEMORAL;  Surgeon: Kathleene Hazel, MD;  Location: [facility] INVASIVE CV LAB;  Service: Open Heart Surgery;  Laterality: N/A;    SOCIAL HISTORY: Social History   Socioeconomic History   Marital status: Married    Spouse name: Not on file   Number of  children: 0   Years of education: Not on file   Highest education level: Not on file  Occupational History   Occupation: Retired Garment/textile technologist: [employer]  Tobacco Use   Smoking status: Never   Smokeless tobacco: Never  Vaping Use   Vaping Use: Never used  Substance and Sexual Activity   Alcohol use: Not Currently   Drug use: No   Sexual activity: Yes  Other Topics Concern   Not on file  Social History Narrative   Patient lives in [place] w/ his wife. He is a native of [place]. 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   Right-handed   Caffeine: 2 cups coffee per day   Two story home   Social Determinants of Health   Financial Resource Strain: Not on file  Food Insecurity: Not on file  Transportation Needs: Not on file  Physical Activity: Not on file  Stress: Not on file  Social Connections: Not on file  Intimate Partner Violence: Not on file    FAMILY HISTORY: Family History  Problem Relation Age of Onset   Colon cancer Mother 89  Hypertension Other    Coronary artery disease Neg Hx    Colon polyps Neg Hx    Esophageal cancer Neg Hx    Rectal cancer Neg Hx    Stomach cancer Neg Hx     ALLERGIES:  is allergic to xarelto [rivaroxaban], corticosteroids, ramipril, zolpidem, and benazepril.  MEDICATIONS:  Current Outpatient Medications  Medication Sig Dispense Refill   amiodarone (PACERONE) 200 MG tablet Take 1 tablet (200 mg total) by mouth 2 (two) times daily. (Patient not taking: Reported on 09/20/2022) 60 tablet 3   aspirin 81 MG chewable tablet CHEW 1 TABLET BY MOUTH DAILY. 90 tablet 1   b complex vitamins tablet Take 1 tablet by mouth daily. 100 tablet 3   Calcium-Magnesium 500-250 MG TABS Take 1 tablet by mouth daily.     Carboxymethylcellul-Glycerin (LUBRICATING EYE DROPS OP) Place 1 drop into both eyes daily as needed (dry eyes).     Cholecalciferol (VITAMIN D) 50 MCG (2000 UT) tablet Take 2,000 Units  by mouth daily.     diclofenac Sodium (VOLTAREN) 1 % GEL Apply 1 application topically 2 (two) times daily as needed (pain.).     ELIQUIS 5 MG TABS tablet TAKE 1 TABLET BY MOUTH TWICE A DAY 60 tablet 11   gabapentin (NEURONTIN) 300 MG capsule Take 1 capsule (300 mg total) by mouth at bedtime. 90 capsule 3   ipratropium (ATROVENT) 0.06 % nasal spray PLACE 2 SPRAYS INTO THE NOSE 3 (THREE) TIMES DAILY. 15 mL 2   lenalidomide (REVLIMID) 5 MG capsule TAKE 1 CAPSULE BY MOUTH 1 TIME A DAY FOR 21 DAYS ON THEN 7 DAYS OFF 21 capsule 0   metoprolol succinate (TOPROL-XL) 25 MG 24 hr tablet Take 1 tablet (25 mg total) by mouth in the morning and at bedtime. 180 tablet 1   metroNIDAZOLE (METROCREAM) 0.75 % cream Apply 1 application topically 2 (two) times daily.     oxyCODONE (OXY IR/ROXICODONE) 5 MG immediate release tablet Take 1-2 tablets (5-10 mg total) by mouth 2 (two) times daily as needed for severe pain. 120 tablet 0   polyethylene glycol powder (GLYCOLAX/MIRALAX) 17 GM/SCOOP powder Take 17-34 g by mouth 2 (two) times daily as needed for moderate constipation. 500 g 5   senna-docusate (SENOKOT-S) 8.6-50 MG tablet Take 2 tablets by mouth at bedtime.     spironolactone (ALDACTONE) 25 MG tablet TAKE 1 TABLET BY MOUTH EVERY DAY 90 tablet 3   Vitamin D, Ergocalciferol, (DRISDOL) 1.25 MG (50000 UNIT) CAPS capsule TAKE 1 CAPSULE BY MOUTH ONE TIME PER WEEK 12 capsule 3   No current facility-administered medications for this visit.   Facility-Administered Medications Ordered in Other Visits  Medication Dose Route Frequency Provider Last Rate Last Admin   0.9 %  sodium chloride infusion   Intravenous Continuous Johney Maine, MD 20 mL/hr at 10/11/22 1515 New Bag at 10/11/22 1515    REVIEW OF SYSTEMS:   .10 Point review of Systems was done is negative except as noted above.  PHYSICAL EXAMINATION: Vitals:   01/10/23 1334  BP: (!) 143/69  Pulse: (!) 57  Resp: 18  Temp: 97.9 F (36.6 C)  SpO2:  100%    Wt Readings from Last 3 Encounters:  11/23/22 168 lb (76.2 kg)  11/13/22 167 lb (75.8 kg)  10/11/22 176 lb 8 oz (80.1 kg)   There is no height or weight on file to calculate BMI.    ECOG FS:2 - Symptomatic, <50% confined to bed . GENERAL:alert, in  no acute distress and comfortable SKIN: no acute rashes, no significant lesions EYES: conjunctiva are pink and non-injected, sclera anicteric OROPHARYNX: MMM, no exudates, no oropharyngeal erythema or ulceration NECK: supple, no JVD LYMPH:  no palpable lymphadenopathy in the cervical, axillary or inguinal regions LUNGS: clear to auscultation b/l with normal respiratory effort HEART: regular rate & rhythm ABDOMEN:  normoactive bowel sounds , non tender, not distended. Extremity: no pedal edema PSYCH: alert & oriented x 3 with fluent speech NEURO: no focal motor/sensory deficits    Exam performed in chair.  LABORATORY DATA:  I have reviewed the data as listed  .    Latest Ref Rng & Units 01/10/2023    1:02 PM 11/13/2022    6:20 PM 10/11/2022    1:00 PM  CBC  WBC 4.0 - 10.5 K/uL 3.2  3.5  4.1   Hemoglobin 13.0 - 17.0 g/dL 16.1  09.6  04.5   Hematocrit 39.0 - 52.0 % 42.8  40.7  42.5   Platelets 150 - 400 K/uL 121  123  106     .    Latest Ref Rng & Units 01/10/2023    1:02 PM 11/13/2022    6:20 PM 10/11/2022    1:00 PM  CMP  Glucose 70 - 99 mg/dL 92  409  88   BUN 8 - 23 mg/dL 23  17  19    Creatinine 0.61 - 1.24 mg/dL 8.11  9.14  7.82   Sodium 135 - 145 mmol/L 141  135  140   Potassium 3.5 - 5.1 mmol/L 4.2  3.7  4.1   Chloride 98 - 111 mmol/L 108  105  107   CO2 22 - 32 mmol/L 26  23  27    Calcium 8.9 - 10.3 mg/dL 9.4  7.8  8.7   Total Protein 6.5 - 8.1 g/dL 6.4  6.4  6.5   Total Bilirubin 0.3 - 1.2 mg/dL 0.5  0.9  0.5   Alkaline Phos 38 - 126 U/L 60  55  71   AST 15 - 41 U/L 25  27  20    ALT 0 - 44 U/L 25  23  23           05/24/2019 Bone Marrow Biopsy    04/16/2019 Surgical Pathology:   Surgical  Pathology  CASE: [id number]  PATIENT: [facility]  Bone Marrow Report      Clinical History: Multiple myeloma, remission status unspecified (HCC)  (BH)      DIAGNOSIS:   BONE MARROW, ASPIRATE, CLOT, CORE:  -Variably cellular bone marrow with trilineage hematopoiesis and 2%  plasma cells  -See comment   PERIPHERAL BLOOD:  -Slight thrombocytopenia   COMMENT:   The bone marrow is variably cellular with trilineage hematopoiesis  including abundant megakaryocytes in the more cellular areas.  In this  background, the plasma cells represent 2% of all cells in the aspirate  with interstitial cells and a few very minute clusters in the  clot/biopsy sections.  The latter in particular display kappa light  chain excess/restriction most suggestive of minimal residual plasma cell  neoplasm despite limited findings.  Correlation with cytogenetic and  FISH studies is recommended.    RADIOGRAPHIC STUDIES: I have personally reviewed the radiological images as listed and agreed with the findings in the report. No results found.  ASSESSMENT & PLAN:   82 yo   #1 Plasma cell myeloma-currently in relapse  03/28/2019 MRI pelvis w/wo contrast revealed "1. Destructive bone lesions as detailed  above. Findings most consistent with metastatic disease. PET-CT may be helpful for further evaluation and to establish a primary tumor. The right pelvic bone lesions should be amenable to image guided biopsy but a PET scan may demonstrate easier/safer biopsy sites. 2. No intrapelvic mass or adenopathy. 3. Benign intraosseous lipoma involving the left anterior superior acetabulum."  04/06/2019 PET whole body revealed "1. Diffuse osseous metastatic disease as detailed above without findings for a primary neoplasm in the chest, abdomen or pelvis. The large destructive lesion involving the right ischium should be amenable to image guided biopsy. 2. Two small retroperitoneal lymph nodes and 1 small right  obturator node showing hypermetabolism."   04/16/2019 Posterior right pelvis bone biopsy revealed "PLASMA CELL NEOPLASM"  05/24/2019 Bone Marrow Biopsy revealed "BONE MARROW: - CELLULAR MARROW WITH INVOLVEMENT BY PLASMA CELL NEOPLASM (20%) PERIPHERAL BLOOD: - MORPHOLOGICALLY UNREMARKABLE"  05/24/2019 FISH Panel revealed "ABNORMAL result with 11q+, 14q+ and +17"  #2 Severe aortic stenosis with bicuspid aortic valve -10/26/2018 ECHO revealed AVA at 0.8 cm2 and LV EF of 60-65% -05/08/2019 pt had a Transfemoral Transcatheter Aortic Valve Replacement  PET CT scan from 2/2 which shows There is persistent low level radiotracer uptake associated with the large lucent lesion within the superior right acetabulum. Additionally, there is a new focal area of increased radiotracer uptake above the background low level activity within this lesion with SUV max of 3.92. On the previous exam there was relatively homogeneous low level uptake within this lesion within SUV max of 2.43. Imaging findings are concerning for residual metabolically active tumor. Patient has seen radiation oncology since his last visit and they have decided to proceed with involved site radiation therapy to the FDG avid lesion to reduce the risk of fracture.  -He had his bone marrow biopsy on 11/03/2021 which shows 2% abnormal plasma cells.  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 and also radiculopathy due to chronic back issues PLAN: -Discussed lab results from today, 01/10/2023, with the patient. CBC shows decreased WBC at 3.2 K and decreased Platelets at 121 K. CMP is stable.  -Discussed Kappa/lambda light chain results and multiple myeloma panel results from 10/11/2022 with the patient. Kappa/Lambda light chain ratio at 5.70. M-Protein not observable.  -Recommended to stay well hydrated, around 2 L of water. -will refill Oxycodone for pain and Gabapentin.  -Answered all of patient's questions. -Patient has no new symptoms  suggestive of myeloma progression at this time. -Pt will continue Revlimid 5 mg and zometa infusion.  -Continue Revlimid 5 mg p.o. daily 3 weeks on 1 week off. -Continue Zometa q22months.  FOLLOW UP: RTC with Dr Candise Che with labs and next dose of Zometa in 3 months   The total time spent in the appointment was 21 minutes* .  All of the patient's questions were answered with apparent satisfaction. The patient knows to call the clinic with any problems, questions or concerns.   Wyvonnia Lora MD MS AAHIVMS [facility] Hematology/Oncology Physician [facility]  .*Total Encounter Time as defined by the Centers for Medicare and Medicaid Services includes, in addition to the face-to-face time of a patient visit (documented in the note above) non-face-to-face time: obtaining and reviewing outside history, ordering and reviewing medications, tests or procedures, care coordination (communications with other health care professionals or caregivers) and documentation in the medical record.   I, Ok Edwards, am acting as a Neurosurgeon for Wyvonnia Lora, MD.  .I have reviewed the above documentation for accuracy and completeness, and I  agree with the above. Brunetta Genera MD

## 2023-01-10 NOTE — Progress Notes (Signed)
Patient seen by Dr. Kale  Vitals are within treatment parameters.  Labs reviewed: and are within treatment parameters.  Per physician team, patient is ready for treatment and there are NO modifications to the treatment plan.  

## 2023-01-11 ENCOUNTER — Telehealth: Payer: Self-pay | Admitting: Hematology

## 2023-01-11 LAB — KAPPA/LAMBDA LIGHT CHAINS
Kappa free light chain: 79 mg/L — ABNORMAL HIGH (ref 3.3–19.4)
Kappa, lambda light chain ratio: 6.87 — ABNORMAL HIGH (ref 0.26–1.65)
Lambda free light chains: 11.5 mg/L (ref 5.7–26.3)

## 2023-01-14 ENCOUNTER — Encounter: Payer: Self-pay | Admitting: Hematology

## 2023-01-14 LAB — MULTIPLE MYELOMA PANEL, SERUM
Albumin SerPl Elph-Mcnc: 3.8 g/dL (ref 2.9–4.4)
Albumin/Glob SerPl: 1.7 (ref 0.7–1.7)
Alpha 1: 0.2 g/dL (ref 0.0–0.4)
Alpha2 Glob SerPl Elph-Mcnc: 0.5 g/dL (ref 0.4–1.0)
B-Globulin SerPl Elph-Mcnc: 0.9 g/dL (ref 0.7–1.3)
Gamma Glob SerPl Elph-Mcnc: 0.7 g/dL (ref 0.4–1.8)
Globulin, Total: 2.3 g/dL (ref 2.2–3.9)
IgA: 96 mg/dL (ref 61–437)
IgG (Immunoglobin G), Serum: 668 mg/dL (ref 603–1613)
IgM (Immunoglobulin M), Srm: 37 mg/dL (ref 15–143)
Total Protein ELP: 6.1 g/dL (ref 6.0–8.5)

## 2023-01-17 ENCOUNTER — Other Ambulatory Visit: Payer: Self-pay

## 2023-01-17 DIAGNOSIS — C9000 Multiple myeloma not having achieved remission: Secondary | ICD-10-CM

## 2023-01-17 MED ORDER — CALCIUM-MAGNESIUM 500-250 MG PO TABS
1.0000 | ORAL_TABLET | Freq: Every day | ORAL | 6 refills | Status: DC
Start: 2023-01-17 — End: 2024-03-02

## 2023-01-19 ENCOUNTER — Encounter: Payer: Self-pay | Admitting: Hematology

## 2023-01-19 DIAGNOSIS — L308 Other specified dermatitis: Secondary | ICD-10-CM | POA: Diagnosis not present

## 2023-01-19 MED ORDER — GABAPENTIN 300 MG PO CAPS: 300.0000 mg | ORAL_CAPSULE | Freq: Every day | ORAL | 3 refills | Status: DC

## 2023-01-19 MED ORDER — OXYCODONE HCL 5 MG PO TABS
5.0000 mg | ORAL_TABLET | Freq: Two times a day (BID) | ORAL | 0 refills | Status: DC | PRN
Start: 2023-01-19 — End: 2023-02-22

## 2023-01-25 ENCOUNTER — Other Ambulatory Visit: Payer: Self-pay | Admitting: Hematology

## 2023-01-25 DIAGNOSIS — C9000 Multiple myeloma not having achieved remission: Secondary | ICD-10-CM

## 2023-01-26 ENCOUNTER — Encounter: Payer: Self-pay | Admitting: Hematology

## 2023-02-09 ENCOUNTER — Emergency Department (HOSPITAL_COMMUNITY): Payer: Medicare Other

## 2023-02-09 ENCOUNTER — Other Ambulatory Visit: Payer: Self-pay

## 2023-02-09 ENCOUNTER — Emergency Department (HOSPITAL_COMMUNITY)
Admission: EM | Admit: 2023-02-09 | Discharge: 2023-02-09 | Disposition: A | Discharge status: Home / Self Care | Payer: Medicare Other | Attending: Emergency Medicine | Admitting: Emergency Medicine

## 2023-02-09 DIAGNOSIS — I4891 Unspecified atrial fibrillation: Secondary | ICD-10-CM

## 2023-02-09 DIAGNOSIS — R0602 Shortness of breath: Secondary | ICD-10-CM | POA: Diagnosis not present

## 2023-02-09 LAB — CBC
HCT: 50.4 % (ref 39.0–52.0)
Hemoglobin: 16.6 g/dL (ref 13.0–17.0)
MCH: 30.9 pg (ref 26.0–34.0)
MCHC: 32.9 g/dL (ref 30.0–36.0)
MCV: 93.9 fL (ref 80.0–100.0)
Platelets: 151 10*3/uL (ref 150–400)
RBC: 5.37 MIL/uL (ref 4.22–5.81)
RDW: 13.9 % (ref 11.5–15.5)
WBC: 3.9 10*3/uL — ABNORMAL LOW (ref 4.0–10.5)
nRBC: 0 % (ref 0.0–0.2)

## 2023-02-09 LAB — BASIC METABOLIC PANEL
Anion gap: 8 (ref 5–15)
BUN: 19 mg/dL (ref 8–23)
CO2: 26 mmol/L (ref 22–32)
Calcium: 9.3 mg/dL (ref 8.9–10.3)
Chloride: 104 mmol/L (ref 98–111)
Creatinine, Ser: 1.12 mg/dL (ref 0.61–1.24)
GFR, Estimated: >60 mL/min (ref >60)
Glucose, Bld: 95 mg/dL (ref 70–99)
Potassium: 4 mmol/L (ref 3.5–5.1)
Sodium: 138 mmol/L (ref 135–145)

## 2023-02-09 MED ORDER — METOPROLOL TARTRATE 5 MG/5ML IV SOLN
5.0000 mg | Freq: Once | INTRAVENOUS | Status: AC
  Administered 2023-02-09: 5 mg via INTRAVENOUS

## 2023-02-09 MED ORDER — METOPROLOL TARTRATE 5 MG/5ML IV SOLN
INTRAVENOUS | Status: AC
  Filled 2023-02-09: qty 5

## 2023-02-09 MED ORDER — PROPOFOL 10 MG/ML IV BOLUS
0.5000 mg/kg | Freq: Once | INTRAVENOUS | Status: AC
  Administered 2023-02-09: 38.6 mg via INTRAVENOUS
  Filled 2023-02-09: qty 20

## 2023-02-09 NOTE — ED Triage Notes (Signed)
Pt. Stated I've had irregular heart rate , feeling weak and SOB for about 3 days .

## 2023-02-09 NOTE — ED Provider Notes (Signed)
[name] [name] EMERGENCY DEPARTMENT AT [facility] Provider Note   CSN: [id number] Arrival date & time: 02/09/23  1032     History  Chief Complaint  Patient presents with  . Shortness of Breath  . Weakness  . Atrial Fibrillation    [name] [name] is a 82 y.o. male.  HPI 82 year old male history of paroxysmal atrial fibrillation, on chronic anticoagulation presents today complaining of atrial fibrillation.  He states that he has noticed for the past 2 days that his heart rate has been palpitating and has felt generally weak.  He identifies this as similar to previous episodes of paroxysmal atrial fibrillation.  He is on Eliquis and has been taking his Eliquis as prescribed.  He has also taken amiodarone last night and today for rate control.  He states that his heart rate is still running around 100.  His heart rate is normally in the 50s.  He denies fever, chills, nausea, vomiting, diarrhea     Home Medications Prior to Admission medications   Medication Sig Start Date End Date Taking? Authorizing Provider  amiodarone (PACERONE) 200 MG tablet Take 1 tablet (200 mg total) by mouth 2 (two) times daily. Patient not taking: Reported on 09/20/2022 08/03/22   Laurey Morale, MD  aspirin 81 MG chewable tablet CHEW 1 TABLET BY MOUTH DAILY. 12/17/22   Laurey Morale, MD  b complex vitamins tablet Take 1 tablet by mouth daily. 09/25/19   [name], Georgina Quint, MD  Calcium-Magnesium 500-250 MG TABS Take 1 tablet by mouth daily. 01/17/23   Johney Maine, MD  Carboxymethylcellul-Glycerin (LUBRICATING EYE DROPS OP) Place 1 drop into both eyes daily as needed (dry eyes).    [provider]  Cholecalciferol (VITAMIN D) 50 MCG (2000 UT) tablet Take 2,000 Units by mouth daily.    [provider]  diclofenac Sodium (VOLTAREN) 1 % GEL Apply 1 application topically 2 (two) times daily as needed (pain.).    [provider]  ELIQUIS 5 MG TABS tablet TAKE 1 TABLET BY  MOUTH TWICE A DAY 03/10/22   Laurey Morale, MD  gabapentin (NEURONTIN) 300 MG capsule Take 1 capsule (300 mg total) by mouth at bedtime. 01/19/23   Johney Maine, MD  ipratropium (ATROVENT) 0.06 % nasal spray PLACE 2 SPRAYS INTO THE NOSE 3 (THREE) TIMES DAILY. 07/30/22 07/30/23  [name], Georgina Quint, MD  lenalidomide (REVLIMID) 5 MG capsule TAKE 1 CAPSULE BY MOUTH 1 TIME A DAY FOR 21 DAYS ON THEN 7 DAYS OFF 01/26/23   Johney Maine, MD  metoprolol succinate (TOPROL-XL) 25 MG 24 hr tablet Take 1 tablet (25 mg total) by mouth in the morning and at bedtime. 12/20/22   Laurey Morale, MD  metroNIDAZOLE (METROCREAM) 0.75 % cream Apply 1 application topically 2 (two) times daily.    [provider]  oxyCODONE (OXY IR/ROXICODONE) 5 MG immediate release tablet Take 1-2 tablets (5-10 mg total) by mouth 2 (two) times daily as needed for severe pain. 01/19/23   Johney Maine, MD  polyethylene glycol powder [facility]) 17 GM/SCOOP powder Take 17-34 g by mouth 2 (two) times daily as needed for moderate constipation. 09/25/19   [name], Georgina Quint, MD  senna-docusate (SENOKOT-S) 8.6-50 MG tablet Take 2 tablets by mouth at bedtime.    [provider]  spironolactone (ALDACTONE) 25 MG tablet TAKE 1 TABLET BY MOUTH EVERY DAY 10/05/22   Laurey Morale, MD  Vitamin D, Ergocalciferol, (DRISDOL) 1.25 MG (50000 UNIT) CAPS  capsule TAKE 1 CAPSULE BY MOUTH ONE TIME PER WEEK 08/09/22   Johney Maine, MD      Allergies    Xarelto [rivaroxaban], Corticosteroids, Ramipril, Zolpidem, and Benazepril    Review of Systems   Review of Systems  Physical Exam Updated Vital Signs BP 113/74   Pulse (!) 58   Temp 98.4 F (36.9 C)   Resp 14   Ht 1.702 m (5\' 7" )   Wt 77.1 kg   SpO2 98%   BMI 26.63 kg/m  Physical Exam Vitals and nursing note reviewed.  HENT:     Head: Normocephalic.     Mouth/Throat:     Mouth: Mucous membranes are moist.  Eyes:     Pupils: Pupils are  equal, round, and reactive to light.  Cardiovascular:     Rate and Rhythm: Normal rate. Rhythm irregular.  Pulmonary:     Effort: Pulmonary effort is normal.  Abdominal:     Palpations: Abdomen is soft.  Musculoskeletal:        General: Normal range of motion.     Cervical back: Normal range of motion.     Right lower leg: No edema.     Left lower leg: No edema.  Skin:    General: Skin is warm.     Capillary Refill: Capillary refill takes less than 2 seconds.  Neurological:     General: No focal deficit present.     Mental Status: He is alert.  Psychiatric:        Mood and Affect: Mood normal.    ED Results / Procedures / Treatments   Labs (all labs ordered are listed, but only abnormal results are displayed) Labs Reviewed  CBC - Abnormal; Notable for the following components:      Result Value   WBC 3.9 (*)    All other components within normal limits  BASIC METABOLIC PANEL    EKG EKG Interpretation  Date/Time:  Wednesday Feb 09 2023 10:31:52 EDT Ventricular Rate:  92 PR Interval:    QRS Duration: 94 QT Interval:  368 QTC Calculation: 455 R Axis:   -48 Text Interpretation: Atrial fibrillation with premature ventricular or aberrantly conducted complexes Left anterior fascicular block Nonspecific ST abnormality Abnormal ECG When compared with ECG of 13-Nov-2022 17:47, PREVIOUS ECG IS PRESENT Confirmed by Margarita Grizzle [phone]) on 02/09/2023 12:40:07 PM  Radiology DG Chest 2 View  Result Date: 02/09/2023 CLINICAL DATA:  Shortness of breath. EXAM: CHEST - 2 VIEW COMPARISON:  November 13, 2022. FINDINGS: The heart size and mediastinal contours are within normal limits. Hyperexpansion of the lungs is noted. Status post transcatheter aortic valve repair. No acute pulmonary abnormality is noted. Probable old lower thoracic compression fracture is noted. IMPRESSION: No active cardiopulmonary disease.  Hyperexpansion of the lungs. Electronically Signed   By: Lupita Raider M.D.    On: 02/09/2023 11:45    Procedures .Cardioversion  Date/Time: 02/09/2023 2:01 PM  Performed by: Margarita Grizzle, MD Authorized by: Margarita Grizzle, MD   Consent:    Consent obtained:  Written   Consent given by:  Patient   Risks discussed:  Cutaneous burn, death, induced arrhythmia and pain   Alternatives discussed:  Rate-control medication Universal protocol:    Procedure explained and questions answered to patient or proxy's satisfaction: yes     Imaging studies available: yes     Immediately prior to procedure a time out was called: yes     Patient identity confirmed:  Verbally with patient  and arm band Pre-procedure details:    Cardioversion basis:  Emergent   Rhythm:  Atrial fibrillation   Electrode placement:  Anterior-posterior Patient sedated: Yes. Refer to sedation procedure documentation for details of sedation.  Attempt one:    Cardioversion mode:  Synchronous   Waveform:  Biphasic   Shock (Joules):  120   Shock outcome:  Conversion to normal sinus rhythm Post-procedure details:    Patient status:  Awake   Patient tolerance of procedure:  Tolerated well, no immediate complications .Sedation  Date/Time: 02/09/2023 2:02 PM  Performed by: Margarita Grizzle, MD Authorized by: Margarita Grizzle, MD   Consent:    Consent obtained:  Written   Consent given by:  Patient   Risks discussed:  Allergic reaction, prolonged sedation necessitating reversal and inadequate sedation Universal protocol:    Immediately prior to procedure, a time out was called: yes     Patient identity confirmed:  Arm band Indications:    Procedure performed:  Cardioversion Pre-sedation assessment:    Time since last food or drink:  7   ASA classification: class 2 - patient with mild systemic disease     Mouth opening:  3 or more finger widths   Thyromental distance:  3 finger widths   Mallampati score:  II - soft palate, uvula, fauces visible   Neck mobility: normal     Pre-sedation assessments  completed and reviewed: airway patency, cardiovascular function, mental status and respiratory function     Pre-sedation assessments completed and reviewed: pre-procedure nausea and vomiting status not reviewed     Pre-sedation assessment completed:  02/09/2023 2:05 PM Immediate pre-procedure details:    Reassessment: Patient reassessed immediately prior to procedure     Reviewed: vital signs and relevant labs/tests     Verified: bag valve mask available, emergency equipment available, intubation equipment available, IV patency confirmed, oxygen available and suction available   Procedure details (see MAR for exact dosages):    Preoxygenation:  Nasal cannula   Sedation:  Propofol   Analgesia:  None   Intra-procedure monitoring:  Blood pressure monitoring and continuous capnometry   Total Provider sedation time (minutes):  30 Post-procedure details:    Post-sedation assessment completed:  02/09/2023 3:06 PM   Attendance: Constant attendance by certified staff until patient recovered     Recovery: Patient returned to pre-procedure baseline     Patient is stable for discharge or admission: no     Procedure completion:  Tolerated well, no immediate complications     Medications Ordered in ED Medications  metoprolol tartrate (LOPRESSOR) 5 MG/5ML injection (  Not Given 02/09/23 1458)  propofol (DIPRIVAN) 10 mg/mL bolus/IV push 38.6 mg (38.6 mg Intravenous Given 02/09/23 1416)  metoprolol tartrate (LOPRESSOR) injection 5 mg (5 mg Intravenous Given 02/09/23 1420)    ED Course/ Medical Decision Making/ A&P Clinical Course as of 02/09/23 1506  Wed Feb 09, 2023  1239 Chest x-Davin Muramoto reviewed interpreted and within normal limits [DR]  1240 CBC reviewed interpreted and within normal limits [DR]  1241 Be met reviewed interpreted within normal limits [DR]    Clinical Course User Index [DR] Margarita Grizzle, MD                             Medical Decision Making Amount and/or Complexity of Data  Reviewed Labs: ordered. Radiology: ordered.  Risk Prescription drug management.   82 year old male presents today complaining of generalized weakness and palpitations Differential diagnosis includes  was not limited to atrial fibrillation, RVR, other cardiac etiologies such as coronary artery disease, infection, electrolyte abnormality, anemia Patient was evaluated here with labs including CBC and basic metabolic panel which did not show any acute abnormalities to account for symptoms Chest x-[name] is obtained EKG is obtained which shows atrial fibrillation with rate in the 90s. Patient requests cardioversion.  I have discussed with the patient that this is a high risk procedure.  I reviewed his last episode of paroxysmal atrial fibrillation.  He was cardioverted here in the emergency department with 120 J. Patient request to proceed with cardioversion.  Cardioversion completed patient converted to normal sinus rhythm maintained on monitor Lopressor given repeat EKG normal sinus rhythm without any evidence of acute ischemia Patient appears stable for discharge        Final Clinical Impression(s) / ED Diagnoses Final diagnoses:  Atrial fibrillation with RVR [facility])    Rx / DC Orders ED Discharge Orders     None         Margarita Grizzle, MD 02/09/23 [phone]

## 2023-02-09 NOTE — Sedation Documentation (Signed)
Cardioversion done at 120 jules. Dr. Rosalia Hammers at bedside with RT, Carollee Herter, RN and Sheilah Mins

## 2023-02-09 NOTE — Sedation Documentation (Signed)
Pt converted to sinus brady HR 58-61

## 2023-02-09 NOTE — Discharge Instructions (Addendum)
Please continue your home medications Return to the emergency department if you are having any new problems Follow-up with your cardiologist next week

## 2023-02-10 ENCOUNTER — Telehealth (HOSPITAL_COMMUNITY): Payer: Self-pay

## 2023-02-10 NOTE — Telephone Encounter (Signed)
Left message for patient to call back to schedule 1-2 week ED follow up appointment to be seen at the [facility]. On the voicemail he was told he can contact us back to schedule follow up appointment or he can reach out to Dr. Alford [facility] clinic to schedule his overdue follow up appointment.

## 2023-02-15 ENCOUNTER — Encounter: Payer: Self-pay | Admitting: *Deleted

## 2023-02-15 ENCOUNTER — Telehealth: Payer: Self-pay | Admitting: *Deleted

## 2023-02-15 NOTE — Transitions of Care (Post Inpatient/ED Visit) (Signed)
02/15/2023  Name: Allard Timperley MRN: [id number] DOB: [birth year]  ED EMMI Red Alert notification on 02/14/23 from ED visit 02/09/23- EMMI call placed 02/15/23: "No scheduled follow up"  Today's TOC FU Call Status: Today's TOC FU Call Status:: Successful TOC FU Call Competed TOC FU Call Complete Date: 02/15/23  Transition Care Management Follow-up Telephone Call Date of Discharge: 02/09/23 Discharge Facility[facility]) Type of Discharge: Emergency Department Primary Inpatient Discharge Diagnosis:: AF with RVR with cardioversion in ED Reason for ED Visit: Cardiac Conditions, Other: (AF with RVR- cardioversion) Cardiac Conditions Diagnosis: Atrial Fibrillation How have you been since you were released from the hospital?: Better (per spouse Zora: "He is doing okay so far; just still feeling weak) Any questions or concerns?: No  Items Reviewed: Did you receive and understand the discharge instructions provided?: Yes (thoroughly reviewed with patient' spouse who verbalizes good understanding of same) Medications obtained,verified, and reconciled?: Yes (Medications Reviewed) (Full medication reconciliation/ review completed; no concerns or discrepancies identified; confirmed patient obtained; spouse-manages medications and denies questions/ concerns around medications today) Any new allergies since your discharge?: No Dietary orders reviewed?: Yes Type of Diet Ordered:: Low salt, heart healthy diet Do you have support at home?: Yes People in Home: spouse Name of Support/Comfort Primary Source: Reports independent in self-care activities; supportive spouse assists as/ if needed/ indicated  Medications Reviewed Today: Medications Reviewed Today     Reviewed by Michaela Corner, RN (Registered Nurse) on 02/15/23 at 1526  Med List Status: <None>   Medication Order Taking? Sig Documenting Provider Last Dose Status Informant  amiodarone (PACERONE) 200 MG tablet [id number] No Take 1 tablet (200  mg total) by mouth 2 (two) times daily.  Patient not taking: Reported on 09/20/2022   Laurey Morale, MD Not Taking Active   aspirin 81 MG chewable tablet [id number] Yes CHEW 1 TABLET BY MOUTH DAILY. Laurey Morale, MD Taking Active   b complex vitamins tablet [id number] Yes Take 1 tablet by mouth daily. [name], Georgina Quint, MD Taking Active Spouse/Significant Other  Calcium-Magnesium 500-250 MG TABS [id number] Yes Take 1 tablet by mouth daily. Johney Maine, MD Taking Active   Carboxymethylcellul-Glycerin [facility] EYE DROPS OP) [id number] Yes Place 1 drop into both eyes daily as needed (dry eyes). [provider] Taking Active Spouse/Significant Other  Cholecalciferol (VITAMIN D) 50 MCG (2000 UT) tablet [id number] Yes Take 2,000 Units by mouth daily. [provider] Taking Active Spouse/Significant Other  diclofenac Sodium (VOLTAREN) 1 % GEL [id number] Yes Apply 1 application topically 2 (two) times daily as needed (pain.). [provider] Taking Active Spouse/Significant Other  ELIQUIS 5 MG TABS tablet [id number] Yes TAKE 1 TABLET BY MOUTH TWICE A DAY Laurey Morale, MD Taking Active   gabapentin (NEURONTIN) 300 MG capsule [id number] Yes Take 1 capsule (300 mg total) by mouth at bedtime. Johney Maine, MD Taking Active   ipratropium (ATROVENT) 0.06 % nasal spray [id number] Yes PLACE 2 SPRAYS INTO THE NOSE 3 (THREE) TIMES DAILY. [name], Georgina Quint, MD Taking Active   lenalidomide (REVLIMID) 5 MG capsule [id number] Yes TAKE 1 CAPSULE BY MOUTH 1 TIME A DAY FOR 21 DAYS ON THEN 7 DAYS OFF Johney Maine, MD Taking Active   metoprolol succinate (TOPROL-XL) 25 MG 24 hr tablet [id number] Yes Take 1 tablet (25 mg total) by mouth in the morning and at bedtime. Laurey Morale, MD Taking Active   metroNIDAZOLE (METROCREAM) 0.75 % cream [id number] Yes Apply 1 application topically 2 (two)  times daily. [provider] Taking Active   oxyCODONE (OXY  IR/ROXICODONE) 5 MG immediate release tablet [id number] Yes Take 1-2 tablets (5-10 mg total) by mouth 2 (two) times daily as needed for severe pain. Johney Maine, MD Taking Active   polyethylene glycol powder [facility]) 17 GM/SCOOP powder [id number] Yes Take 17-34 g by mouth 2 (two) times daily as needed for moderate constipation. [name], Georgina Quint, MD Taking Active Spouse/Significant Other  senna-docusate (SENOKOT-S) 8.6-50 MG tablet [id number] Yes Take 2 tablets by mouth at bedtime. [provider] Taking Active Spouse/Significant Other  spironolactone (ALDACTONE) 25 MG tablet [id number] Yes TAKE 1 TABLET BY MOUTH EVERY DAY Laurey Morale, MD Taking Active   Vitamin D, Ergocalciferol, (DRISDOL) 1.25 MG (50000 UNIT) CAPS capsule [id number] Yes TAKE 1 CAPSULE BY MOUTH ONE TIME PER WEEK Johney Maine, MD Taking Active   Med List Note Otis Peak, [facility] 09/23/22 1430): Revlimid filled through [facility] as of January 2024            Home Care and Equipment/Supplies: Were Home Health Services Ordered?: No Any new equipment or medical supplies ordered?: No  Functional Questionnaire: Do you need assistance with bathing/showering or dressing?: No Do you need assistance with meal preparation?: No Do you need assistance with eating?: No Do you have difficulty maintaining continence: No Do you need assistance with getting out of bed/getting out of a chair/moving?: No Do you have difficulty managing or taking your medications?: No  Follow up appointments reviewed: PCP Follow-up appointment confirmed?: Yes Date of PCP follow-up appointment?: 02/22/23 Follow-up Provider: PCP Specialist Hospital Follow-up appointment confirmed?: No Reason Specialist Follow-Up Not Confirmed: Patient has Specialist Provider Number and will Call for Appointment Do you need transportation to your follow-up appointment?: No Do you understand care options if your  condition(s) worsen?: Yes-patient verbalized understanding  SDOH Interventions Today    Flowsheet Row Most Recent Value  SDOH Interventions   Food Insecurity Interventions Intervention Not Indicated  Transportation Interventions Intervention Not Indicated  [wife primarily provides transportation,  patient drives self occasionally]      TOC Interventions Today    Flowsheet Row Most Recent Value  TOC Interventions   TOC Interventions Discussed/Reviewed TOC Interventions Discussed  [Spouse declines need for ongoing/ further care coordination outreach,  no care coordination needs identified at time of TOC call today,  provided my direct contact information should questions/ concerns/ needs arise post-TOC call]      Interventions Today    Flowsheet Row Most Recent Value  Chronic Disease   Chronic disease during today's visit Atrial Fibrillation (AFib)  General Interventions   General Interventions Discussed/Reviewed General Interventions Discussed, Doctor Visits  Doctor Visits Discussed/Reviewed Specialist, Doctor Visits Discussed, PCP, Doctor Visits Reviewed  [Thoroughly reviewed last cardiology provider office note from Oct 2023 with patient's spouse,  confirmed spouse has contact information for Dr. Shirlee Latch  PCP/Specialist Visits Compliance with follow-up visit  Education Interventions   Education Provided Provided Education  Provided Verbal Education On Medication  [verified for patient's spouse that patient is suppsoed to be on both ASA 81 mg and Eliquis per Dr. Shirlee Latch last note in Oct 2023]  Nutrition Interventions   Nutrition Discussed/Reviewed Nutrition Discussed  Pharmacy Interventions   Pharmacy Dicussed/Reviewed Pharmacy Topics Discussed  [Full medication review with updating medication list in EHR per spouse report]      Caryl Pina, RN, BSN, CCRN Alumnus RN CM Care Coordination/ Transition of Care- [facility] Care Management [phone]: direct  office

## 2023-02-22 ENCOUNTER — Other Ambulatory Visit: Payer: Self-pay | Admitting: Hematology

## 2023-02-22 ENCOUNTER — Ambulatory Visit (INDEPENDENT_AMBULATORY_CARE_PROVIDER_SITE_OTHER): Payer: Medicare Other | Admitting: Internal Medicine

## 2023-02-22 ENCOUNTER — Encounter: Payer: Self-pay | Admitting: Internal Medicine

## 2023-02-22 ENCOUNTER — Ambulatory Visit (INDEPENDENT_AMBULATORY_CARE_PROVIDER_SITE_OTHER): Payer: Medicare Other

## 2023-02-22 VITALS — BP 120/70 | HR 60 | Temp 98.2°F | Ht 67.0 in | Wt 171.0 lb

## 2023-02-22 DIAGNOSIS — M5481 Occipital neuralgia: Secondary | ICD-10-CM

## 2023-02-22 DIAGNOSIS — I10 Essential (primary) hypertension: Secondary | ICD-10-CM | POA: Diagnosis not present

## 2023-02-22 DIAGNOSIS — F418 Other specified anxiety disorders: Secondary | ICD-10-CM

## 2023-02-22 DIAGNOSIS — C9000 Multiple myeloma not having achieved remission: Secondary | ICD-10-CM

## 2023-02-22 DIAGNOSIS — I2510 Atherosclerotic heart disease of native coronary artery without angina pectoris: Secondary | ICD-10-CM

## 2023-02-22 DIAGNOSIS — I480 Paroxysmal atrial fibrillation: Secondary | ICD-10-CM

## 2023-02-22 DIAGNOSIS — M542 Cervicalgia: Secondary | ICD-10-CM | POA: Diagnosis not present

## 2023-02-22 MED ORDER — OXYCODONE HCL 5 MG PO TABS: 5.0000 mg | ORAL_TABLET | Freq: Two times a day (BID) | ORAL | 0 refills | Status: DC | PRN

## 2023-02-22 MED ORDER — METHYLPREDNISOLONE 4 MG PO TBPK: ORAL_TABLET | ORAL | 0 refills | Status: DC

## 2023-02-22 MED ORDER — IPRATROPIUM BROMIDE 0.06 % NA SOLN: 2.0000 | Freq: Three times a day (TID) | NASAL | 2 refills | Status: DC

## 2023-02-22 NOTE — Assessment & Plan Note (Signed)
Monitor labs 

## 2023-02-22 NOTE — Assessment & Plan Note (Addendum)
Steroids po - given, injection option discussed Oxycodone prn, Gabapentin for chronic pain in the legs, and feet Oxycontin - not covered.  Potential benefits of a long term opioids use as well as potential risks (i.e. addiction risk, apnea etc) and complications (i.e. Somnolence, constipation and others) were explained to the patient and were aknowledged. TENS - declined Blue-Emu cream was recommended to use 2-3 times a day

## 2023-02-22 NOTE — Assessment & Plan Note (Signed)
On Metoprolol, spironolactone 

## 2023-02-22 NOTE — Assessment & Plan Note (Signed)
Coping well. 

## 2023-02-22 NOTE — Patient Instructions (Signed)
Blue-Emu cream-- use 3 times a day

## 2023-02-22 NOTE — Assessment & Plan Note (Signed)
S/p cardioversion in 01/2023

## 2023-02-22 NOTE — Progress Notes (Unsigned)
Subjective:  Patient ID: [name] [name], male    DOB: Sep 29, 1940  Age: 82 y.o. MRN: [id number]  CC: Follow-up (3 MNTH F/U)   HPI Maxx Buley presents for MM, A fib, rash on the neck, pain in the R head/face. He is here with his wife  Outpatient Medications Prior to Visit  Medication Sig Dispense Refill   aspirin 81 MG chewable tablet CHEW 1 TABLET BY MOUTH DAILY. 90 tablet 1   b complex vitamins tablet Take 1 tablet by mouth daily. 100 tablet 3   Calcium-Magnesium 500-250 MG TABS Take 1 tablet by mouth daily. 30 tablet 6   Carboxymethylcellul-Glycerin (LUBRICATING EYE DROPS OP) Place 1 drop into both eyes daily as needed (dry eyes).     Cholecalciferol (VITAMIN D) 50 MCG (2000 UT) tablet Take 2,000 Units by mouth daily.     diclofenac Sodium (VOLTAREN) 1 % GEL Apply 1 application topically 2 (two) times daily as needed (pain.).     ELIQUIS 5 MG TABS tablet TAKE 1 TABLET BY MOUTH TWICE A DAY 60 tablet 11   gabapentin (NEURONTIN) 300 MG capsule Take 1 capsule (300 mg total) by mouth at bedtime. 90 capsule 3   metoprolol succinate (TOPROL-XL) 25 MG 24 hr tablet Take 1 tablet (25 mg total) by mouth in the morning and at bedtime. 180 tablet 1   metroNIDAZOLE (METROCREAM) 0.75 % cream Apply 1 application topically 2 (two) times daily.     polyethylene glycol powder (GLYCOLAX/MIRALAX) 17 GM/SCOOP powder Take 17-34 g by mouth 2 (two) times daily as needed for moderate constipation. 500 g 5   senna-docusate (SENOKOT-S) 8.6-50 MG tablet Take 2 tablets by mouth at bedtime.     spironolactone (ALDACTONE) 25 MG tablet TAKE 1 TABLET BY MOUTH EVERY DAY 90 tablet 3   Vitamin D, Ergocalciferol, (DRISDOL) 1.25 MG (50000 UNIT) CAPS capsule TAKE 1 CAPSULE BY MOUTH ONE TIME PER WEEK 12 capsule 3   ipratropium (ATROVENT) 0.06 % nasal spray PLACE 2 SPRAYS INTO THE NOSE 3 (THREE) TIMES DAILY. 15 mL 2   lenalidomide (REVLIMID) 5 MG capsule TAKE 1 CAPSULE BY MOUTH 1 TIME A DAY FOR 21 DAYS ON THEN 7 DAYS OFF 21  capsule 0   oxyCODONE (OXY IR/ROXICODONE) 5 MG immediate release tablet Take 1-2 tablets (5-10 mg total) by mouth 2 (two) times daily as needed for severe pain. 120 tablet 0   amiodarone (PACERONE) 200 MG tablet Take 1 tablet (200 mg total) by mouth 2 (two) times daily. (Patient not taking: Reported on 09/20/2022) 60 tablet 3   0.9 %  sodium chloride infusion      No facility-administered medications prior to visit.    ROS: Review of Systems  Constitutional:  Positive for fatigue. Negative for appetite change and unexpected weight change.  HENT:  Negative for congestion, nosebleeds, sneezing, sore throat and trouble swallowing.   Eyes:  Negative for itching and visual disturbance.  Respiratory:  Negative for cough.   Cardiovascular:  Negative for chest pain, palpitations and leg swelling.  Gastrointestinal:  Negative for abdominal distention, blood in stool, diarrhea and nausea.  Genitourinary:  Negative for frequency and hematuria.  Musculoskeletal:  Positive for arthralgias, back pain, gait problem, neck pain and neck stiffness. Negative for joint swelling.  Skin:  Positive for rash.  Neurological:  Positive for weakness. Negative for dizziness, tremors and speech difficulty.  Psychiatric/Behavioral:  Negative for agitation, dysphoric mood, sleep disturbance and suicidal ideas. The patient is not nervous/anxious.  Objective:  BP 120/70 (BP Location: Left Arm, Patient Position: Sitting, Cuff Size: Large)   Pulse 60   Temp 98.2 F (36.8 C) (Oral)   Ht 5\' 7"  (1.702 m)   Wt 171 lb (77.6 kg)   SpO2 98%   BMI 26.78 kg/m   BP Readings from Last 3 Encounters:  02/22/23 120/70  02/09/23 113/74  01/10/23 (!) 143/69    Wt Readings from Last 3 Encounters:  02/22/23 171 lb (77.6 kg)  02/09/23 170 lb (77.1 kg)  01/10/23 174 lb 6.4 oz (79.1 kg)    Physical Exam Constitutional:      General: He is not in acute distress.    Appearance: Normal appearance. He is well-developed.      Comments: NAD  Eyes:     Conjunctiva/sclera: Conjunctivae normal.     Pupils: Pupils are equal, round, and reactive to light.  Neck:     Thyroid: No thyromegaly.     Vascular: No JVD.  Cardiovascular:     Rate and Rhythm: Normal rate and regular rhythm.     Heart sounds: Murmur heard.     No friction rub. No gallop.  Pulmonary:     Effort: Pulmonary effort is normal. No respiratory distress.     Breath sounds: Normal breath sounds. No wheezing or rales.  Chest:     Chest wall: No tenderness.  Abdominal:     General: Bowel sounds are normal. There is no distension.     Palpations: Abdomen is soft. There is no mass.     Tenderness: There is no abdominal tenderness. There is no guarding or rebound.  Musculoskeletal:        General: No tenderness. Normal range of motion.     Cervical back: Normal range of motion.     Right lower leg: No edema.     Left lower leg: No edema.  Lymphadenopathy:     Cervical: No cervical adenopathy.  Skin:    General: Skin is warm and dry.     Findings: No rash.  Neurological:     Mental Status: He is alert and oriented to person, place, and time.     Cranial Nerves: No cranial nerve deficit.     Motor: Weakness present. No abnormal muscle tone.     Coordination: Coordination normal.     Gait: Gait abnormal.     Deep Tendon Reflexes: Reflexes are normal and symmetric.  Psychiatric:        Behavior: Behavior normal.        Thought Content: Thought content normal.        Judgment: Judgment normal.   Using a cane R occip nerve area is tender Heart rate is irregular with irregular beats No rash  Lab Results  Component Value Date   WBC 3.9 (L) 02/09/2023   HGB 16.6 02/09/2023   HCT 50.4 02/09/2023   PLT 151 02/09/2023   GLUCOSE 95 02/09/2023   CHOL 144 08/01/2018   TRIG 167.0 (H) 08/01/2018   HDL 28.20 (L) 08/01/2018   LDLDIRECT 151.5 11/20/2007   LDLCALC 82 08/01/2018   ALT 25 01/10/2023   AST 25 01/10/2023   NA 138 02/09/2023   K 4.0  02/09/2023   CL 104 02/09/2023   CREATININE 1.12 02/09/2023   BUN 19 02/09/2023   CO2 26 02/09/2023   TSH 5.338 (H) 06/23/2020   PSA 4.20 (H) 08/01/2018   INR 1.5 (H) 11/13/2022   HGBA1C 5.8 (H) 05/04/2019    DG Chest  2 View  Result Date: 02/09/2023 CLINICAL DATA:  Shortness of breath. EXAM: CHEST - 2 VIEW COMPARISON:  November 13, 2022. FINDINGS: The heart size and mediastinal contours are within normal limits. Hyperexpansion of the lungs is noted. Status post transcatheter aortic valve repair. No acute pulmonary abnormality is noted. Probable old lower thoracic compression fracture is noted. IMPRESSION: No active cardiopulmonary disease.  Hyperexpansion of the lungs. Electronically Signed   By: Lupita Raider M.D.   On: 02/09/2023 11:45    Assessment & Plan:   Problem List Items Addressed This Visit     Essential hypertension - Primary    On Metoprolol, spironolactone      PAF (paroxysmal atrial fibrillation) (HCC)    S/p cardioversion in 01/2023      CAD (coronary artery disease)    Monitor labs      Multiple myeloma not having achieved remission (HCC)   Relevant Medications   oxyCODONE (OXY IR/ROXICODONE) 5 MG immediate release tablet   methylPREDNISolone (MEDROL DOSEPAK) 4 MG TBPK tablet   Occipital neuralgia of right side    Steroids po - given, injection option discussed Oxycodone prn, Gabapentin for chronic pain in the legs, and feet Oxycontin - not covered.  Potential benefits of a long term opioids use as well as potential risks (i.e. addiction risk, apnea etc) and complications (i.e. Somnolence, constipation and others) were explained to the patient and were aknowledged. TENS - declined Blue-Emu cream was recommended to use 2-3 times a day       Relevant Medications   oxyCODONE (OXY IR/ROXICODONE) 5 MG immediate release tablet   Other Relevant Orders   DG Cervical Spine Complete   Depression with anxiety    Coping well         Meds ordered this  encounter  Medications   ipratropium (ATROVENT) 0.06 % nasal spray    Sig: Place 2 sprays into the nose 3 (three) times daily.    Dispense:  15 mL    Refill:  2   oxyCODONE (OXY IR/ROXICODONE) 5 MG immediate release tablet    Sig: Take 1-2 tablets (5-10 mg total) by mouth 2 (two) times daily as needed for severe pain.    Dispense:  120 tablet    Refill:  0   methylPREDNISolone (MEDROL DOSEPAK) 4 MG TBPK tablet    Sig: As directed    Dispense:  21 tablet    Refill:  0      Follow-up: Return in about 6 months (around 08/24/2023) for a follow-up visit.  Sonda Primes, MD

## 2023-02-25 ENCOUNTER — Other Ambulatory Visit (HOSPITAL_COMMUNITY): Payer: Self-pay | Admitting: Cardiology

## 2023-03-10 ENCOUNTER — Telehealth: Payer: Self-pay | Admitting: Hematology

## 2023-03-22 ENCOUNTER — Other Ambulatory Visit: Payer: Self-pay | Admitting: Hematology

## 2023-03-22 DIAGNOSIS — C9000 Multiple myeloma not having achieved remission: Secondary | ICD-10-CM

## 2023-04-11 ENCOUNTER — Other Ambulatory Visit: Payer: Self-pay | Admitting: Physician Assistant

## 2023-04-11 ENCOUNTER — Other Ambulatory Visit: Payer: Medicare Other

## 2023-04-11 ENCOUNTER — Ambulatory Visit: Payer: Medicare Other | Admitting: Hematology

## 2023-04-11 ENCOUNTER — Ambulatory Visit: Payer: Medicare Other

## 2023-04-11 DIAGNOSIS — C9000 Multiple myeloma not having achieved remission: Secondary | ICD-10-CM

## 2023-04-12 ENCOUNTER — Inpatient Hospital Stay: Payer: Medicare Other

## 2023-04-12 ENCOUNTER — Inpatient Hospital Stay: Payer: Medicare Other | Admitting: Physician Assistant

## 2023-04-12 VITALS — BP 139/74 | HR 65 | Temp 98.3°F | Resp 18 | Wt 176.1 lb

## 2023-04-12 DIAGNOSIS — D696 Thrombocytopenia, unspecified: Secondary | ICD-10-CM | POA: Insufficient documentation

## 2023-04-12 DIAGNOSIS — C9000 Multiple myeloma not having achieved remission: Secondary | ICD-10-CM

## 2023-04-12 DIAGNOSIS — G620 Drug-induced polyneuropathy: Secondary | ICD-10-CM | POA: Diagnosis not present

## 2023-04-12 DIAGNOSIS — Z7189 Other specified counseling: Secondary | ICD-10-CM

## 2023-04-12 DIAGNOSIS — C7951 Secondary malignant neoplasm of bone: Secondary | ICD-10-CM

## 2023-04-12 LAB — CBC WITH DIFFERENTIAL (CANCER CENTER ONLY)
Abs Immature Granulocytes: 0.04 10*3/uL (ref 0.00–0.07)
Basophils Absolute: 0.1 10*3/uL (ref 0.0–0.1)
Basophils Relative: 2 %
Eosinophils Absolute: 0.2 10*3/uL (ref 0.0–0.5)
Eosinophils Relative: 5 %
HCT: 43.5 % (ref 39.0–52.0)
Hemoglobin: 14.5 g/dL (ref 13.0–17.0)
Immature Granulocytes: 1 %
Lymphocytes Relative: 23 %
Lymphs Abs: 0.9 10*3/uL (ref 0.7–4.0)
MCH: 30.8 pg (ref 26.0–34.0)
MCHC: 33.3 g/dL (ref 30.0–36.0)
MCV: 92.4 fL (ref 80.0–100.0)
Monocytes Absolute: 0.3 10*3/uL (ref 0.1–1.0)
Monocytes Relative: 8 %
Neutro Abs: 2.5 10*3/uL (ref 1.7–7.7)
Neutrophils Relative %: 61 %
Platelet Count: 131 10*3/uL — ABNORMAL LOW (ref 150–400)
RBC: 4.71 MIL/uL (ref 4.22–5.81)
RDW: 13.3 % (ref 11.5–15.5)
WBC Count: 4 10*3/uL (ref 4.0–10.5)
nRBC: 0 % (ref 0.0–0.2)

## 2023-04-12 LAB — CMP (CANCER CENTER ONLY)
ALT: 28 U/L (ref 0–44)
AST: 22 U/L (ref 15–41)
Albumin: 4.4 g/dL (ref 3.5–5.0)
Alkaline Phosphatase: 72 U/L (ref 38–126)
Anion gap: 7 (ref 5–15)
BUN: 19 mg/dL (ref 8–23)
CO2: 28 mmol/L (ref 22–32)
Calcium: 9.4 mg/dL (ref 8.9–10.3)
Chloride: 104 mmol/L (ref 98–111)
Creatinine: 1.43 mg/dL — ABNORMAL HIGH (ref 0.61–1.24)
GFR, Estimated: 49 mL/min — ABNORMAL LOW (ref >60)
Glucose, Bld: 97 mg/dL (ref 70–99)
Potassium: 4.3 mmol/L (ref 3.5–5.1)
Sodium: 139 mmol/L (ref 135–145)
Total Bilirubin: 0.5 mg/dL (ref 0.3–1.2)
Total Protein: 6.6 g/dL (ref 6.5–8.1)

## 2023-04-12 MED ORDER — ZOLEDRONIC ACID 4 MG/100ML IV SOLN
4.0000 mg | Freq: Once | INTRAVENOUS | Status: AC
  Administered 2023-04-12: 4 mg via INTRAVENOUS
  Filled 2023-04-12: qty 100

## 2023-04-12 MED ORDER — SODIUM CHLORIDE 0.9 % IV SOLN: INTRAVENOUS | Status: DC

## 2023-04-12 NOTE — Progress Notes (Signed)
Per Georga Kaufmann, PA-C, OK to treat with Cr 1.43.

## 2023-04-12 NOTE — Progress Notes (Signed)
HEMATOLOGY/ONCOLOGY CLINIC NOTE  Date of Service: 04/12/23    Patient Care Team: Tresa Garter, MD as PCP - General Laurey Morale, MD as PCP - Advanced Heart Failure (Cardiology) Laurey Morale, MD as PCP - Cardiology (Cardiology) Maeola Harman, MD as Attending Physician (Neurosurgery) Donzetta Starch, MD as Consulting Physician (Dermatology) Oretha Milch, MD as Consulting Physician (Pulmonary Disease) Glendale Chard, DO as Consulting Physician (Neurology)   CHIEF COMPLAINTS/PURPOSE OF CONSULTATION:  Follow-up for continued evaluation and management of multiple myeloma   INTERVAL HISTORY: [name] [name] Is a 82 y.o. male is here for continued evaluation and management of multiple myeloma.He was last seen by Dr. Candise Che on 01/10/2023. In the interim, he continues on Revlimid therapy and Zometa q 12 weeks. He is unaccompanied for this visit.   Mr. Wolber reports he is doing well with stable energy and appetite. His biggest symptoms is persistent neuropathic pain in his lower extremities. He reports gabapentin was not effective. He uses a cane to assist with ambulation. He denies any recent falls.   He denies nausea, vomiting or bowel habit changes. He denies easy bruising or signs of active bleeding. He denies easy bruising or signs of active bleeding. He denies fevers, chills, sweats, shortness of breath, chest pain or cough. He has no other complaints.    MEDICAL HISTORY:  Past Medical History:  Diagnosis Date   Ascending aortic aneurysm (HCC)    Bicuspid aortic valve    Cancer (HCC)    CHF NYHA class I (no symptoms from ordinary activities), acute, diastolic (HCC)    Dysrhythmia 1610   A fib   Fatty liver    mild   Fracture    left proximal humerus   GERD (gastroesophageal reflux disease)    GI bleeding 07/21/2018   post polypectomy   Hemorrhoids    HTN (hypertension)    Hypercholesteremia    Hypokalemia    Internal hemorrhoids    LBP (low back pain)     Moderate aortic stenosis    Osteoarthritis    Paroxysmal atrial fibrillation (HCC)    a. new onset Afib in 07/2008. He underwent ibutilide cardioversion successfully. b. Recurrence 01/2013 s/p TEE/DCCV - was on Xarelto but he stopped it as he was convinced it was causing joint pn. c. Recurrence 01/2016 - spont conv to NSR. Pt took Eliquis x1 mo then declined further anticoag. d. Recurrence 07/2016.   Pneumonia    Tubular adenoma of colon     SURGICAL HISTORY: Past Surgical History:  Procedure Laterality Date   BACK SURGERY  x12 years ago   CARDIAC CATHETERIZATION  2020   CARDIAC VALVE REPLACEMENT  2020   CARDIOVERSION N/A 01/26/2013   Procedure: CARDIOVERSION;  Surgeon: Laurey Morale, MD;  Location: [facility] ENDOSCOPY;  Service: Cardiovascular;  Laterality: N/A;   CARDIOVERSION N/A 10/28/2017   Procedure: CARDIOVERSION;  Surgeon: Laurey Morale, MD;  Location: [facility] ENDOSCOPY;  Service: Cardiovascular;  Laterality: N/A;   CARDIOVERSION N/A 03/03/2018   Procedure: CARDIOVERSION;  Surgeon: Lewayne Bunting, MD;  Location: [facility] ENDOSCOPY;  Service: Cardiovascular;  Laterality: N/A;   CARDIOVERSION N/A 09/19/2019   Procedure: CARDIOVERSION;  Surgeon: Laurey Morale, MD;  Location: [facility] ENDOSCOPY;  Service: Cardiovascular;  Laterality: N/A;   COLONOSCOPY     COLONOSCOPY  07/17/2018   at [facility]   HEMORRHOID SURGERY     INGUINAL HERNIA REPAIR Left 03/18/2020   Procedure: OPEN LEFT INGUINAL HERNIA REPAIR;  Surgeon: Ovidio Kin, MD;  Location: [facility] OR;  Service: General;  Laterality: Left;   LUMBAR LAMINECTOMY     ORIF HUMERUS FRACTURE Left 01/15/2020   Procedure: OPEN REDUCTION INTERNAL FIXATION (ORIF) PROXIMAL HUMERUS FRACTURE;  Surgeon: Yolonda Kida, MD;  Location: [facility] OR;  Service: Orthopedics;  Laterality: Left;   POLYPECTOMY     RIGHT HEART CATH N/A 08/20/2019   Procedure: RIGHT HEART CATH;  Surgeon: Laurey Morale, MD;  Location: [facility] INVASIVE CV LAB;  Service: Cardiovascular;  Laterality: N/A;    RIGHT/LEFT HEART CATH AND CORONARY ANGIOGRAPHY N/A 03/07/2019   Procedure: RIGHT/LEFT HEART CATH AND CORONARY ANGIOGRAPHY;  Surgeon: Kathleene Hazel, MD;  Location: [facility] INVASIVE CV LAB;  Service: Cardiovascular;  Laterality: N/A;   TAVAR  04/2019   TEE WITHOUT CARDIOVERSION N/A 01/26/2013   Procedure: TRANSESOPHAGEAL ECHOCARDIOGRAM (TEE);  Surgeon: Laurey Morale, MD;  Location: [facility] ENDOSCOPY;  Service: Cardiovascular;  Laterality: N/A;   TEE WITHOUT CARDIOVERSION N/A 10/28/2017   Procedure: TRANSESOPHAGEAL ECHOCARDIOGRAM (TEE);  Surgeon: Laurey Morale, MD;  Location: [facility] ENDOSCOPY;  Service: Cardiovascular;  Laterality: N/A;   TEE WITHOUT CARDIOVERSION N/A 05/08/2019   Procedure: TRANSESOPHAGEAL ECHOCARDIOGRAM (TEE);  Surgeon: Kathleene Hazel, MD;  Location: [facility] INVASIVE CV LAB;  Service: Open Heart Surgery;  Laterality: N/A;   TEE WITHOUT CARDIOVERSION N/A 09/19/2019   Procedure: TRANSESOPHAGEAL ECHOCARDIOGRAM (TEE);  Surgeon: Laurey Morale, MD;  Location: [facility] ENDOSCOPY;  Service: Cardiovascular;  Laterality: N/A;   TRANSCATHETER AORTIC VALVE REPLACEMENT, TRANSFEMORAL N/A 05/08/2019   Procedure: TRANSCATHETER AORTIC VALVE REPLACEMENT, TRANSFEMORAL;  Surgeon: Kathleene Hazel, MD;  Location: [facility] INVASIVE CV LAB;  Service: Open Heart Surgery;  Laterality: N/A;    SOCIAL HISTORY: Social History   Socioeconomic History   Marital status: Married    Spouse name: Not on file   Number of children: 0   Years of education: Not on file   Highest education level: Not on file  Occupational History   Occupation: Retired Garment/textile technologist: [employer]  Tobacco Use   Smoking status: Never   Smokeless tobacco: Never  Vaping Use   Vaping status: Never Used  Substance and Sexual Activity   Alcohol use: Not Currently   Drug use: No   Sexual activity: Yes  Other Topics Concern   Not on file  Social History Narrative   Patient lives in [place] w/ his wife. He is a  native of [place]. 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   Right-handed   Caffeine: 2 cups coffee per day   Two story home   Social Determinants of Health   Financial Resource Strain: Not on file  Food Insecurity: No Food Insecurity (02/15/2023)   Hunger Vital Sign    Worried About Running Out of Food in the Last Year: Never true    Ran Out of Food in the Last Year: Never true  Transportation Needs: No Transportation Needs (02/15/2023)   PRAPARE - Administrator, Civil Service (Medical): No    Lack of Transportation (Non-Medical): No  Physical Activity: Not on file  Stress: Not on file  Social Connections: Not on file  Intimate Partner Violence: Not on file    FAMILY HISTORY: Family History  Problem Relation Age of Onset   Colon cancer Mother 54   Hypertension Other    Coronary artery disease Neg Hx    Colon polyps Neg Hx    Esophageal cancer Neg Hx  Rectal cancer Neg Hx    Stomach cancer Neg Hx     ALLERGIES:  is allergic to xarelto [rivaroxaban], corticosteroids, ramipril, zolpidem, and benazepril.  MEDICATIONS:  Current Outpatient Medications  Medication Sig Dispense Refill   apixaban (ELIQUIS) 5 MG TABS tablet Take 1 tablet (5 mg total) by mouth 2 (two) times daily. NEEDS FOLLOW UP APPOINTMENT FORE MORE REFILLS 180 tablet 0   aspirin 81 MG chewable tablet CHEW 1 TABLET BY MOUTH DAILY. 90 tablet 1   b complex vitamins tablet Take 1 tablet by mouth daily. 100 tablet 3   Calcium-Magnesium 500-250 MG TABS Take 1 tablet by mouth daily. 30 tablet 6   Carboxymethylcellul-Glycerin (LUBRICATING EYE DROPS OP) Place 1 drop into both eyes daily as needed (dry eyes).     Cholecalciferol (VITAMIN D) 50 MCG (2000 UT) tablet Take 2,000 Units by mouth daily.     diclofenac Sodium (VOLTAREN) 1 % GEL Apply 1 application topically 2 (two) times daily as needed (pain.).     gabapentin (NEURONTIN) 300 MG capsule Take 1 capsule (300 mg total)  by mouth at bedtime. 90 capsule 3   ipratropium (ATROVENT) 0.06 % nasal spray Place 2 sprays into the nose 3 (three) times daily. 15 mL 2   lenalidomide (REVLIMID) 5 MG capsule TAKE 1 CAPSULE BY MOUTH 1 TIME A DAY FOR 21 DAYS ON THEN 7 DAYS OFF 21 capsule 0   methylPREDNISolone (MEDROL DOSEPAK) 4 MG TBPK tablet As directed 21 tablet 0   metoprolol succinate (TOPROL-XL) 25 MG 24 hr tablet Take 1 tablet (25 mg total) by mouth in the morning and at bedtime. 180 tablet 1   metroNIDAZOLE (METROCREAM) 0.75 % cream Apply 1 application topically 2 (two) times daily.     oxyCODONE (OXY IR/ROXICODONE) 5 MG immediate release tablet Take 1-2 tablets (5-10 mg total) by mouth 2 (two) times daily as needed for severe pain. 120 tablet 0   polyethylene glycol powder (GLYCOLAX/MIRALAX) 17 GM/SCOOP powder Take 17-34 g by mouth 2 (two) times daily as needed for moderate constipation. 500 g 5   senna-docusate (SENOKOT-S) 8.6-50 MG tablet Take 2 tablets by mouth at bedtime.     spironolactone (ALDACTONE) 25 MG tablet TAKE 1 TABLET BY MOUTH EVERY DAY 90 tablet 3   Vitamin D, Ergocalciferol, (DRISDOL) 1.25 MG (50000 UNIT) CAPS capsule TAKE 1 CAPSULE BY MOUTH ONE TIME PER WEEK 12 capsule 3   No current facility-administered medications for this visit.   Facility-Administered Medications Ordered in Other Visits  Medication Dose Route Frequency Provider Last Rate Last Admin   0.9 %  sodium chloride infusion   Intravenous Continuous Briant Cedar, PA-C   Stopped at 04/12/23 1610    REVIEW OF SYSTEMS:   10 Point review of Systems was done is negative except as noted above.  PHYSICAL EXAMINATION: Vitals:   04/12/23 1500  BP: 139/74  Pulse: 65  Resp: 18  Temp: 98.3 F (36.8 C)  SpO2: 98%     Wt Readings from Last 3 Encounters:  04/12/23 176 lb 1.6 oz (79.9 kg)  02/22/23 171 lb (77.6 kg)  02/09/23 170 lb (77.1 kg)   Body mass index is 27.58 kg/m.    ECOG FS:1 - Symptomatic but completely  ambulatory . GENERAL:alert, in no acute distress and comfortable SKIN: no acute rashes, no significant lesions EYES: conjunctiva are pink and non-injected, sclera anicteric LUNGS: clear to auscultation b/l with normal respiratory effort HEART: regular rate & rhythm Extremity: no pedal edema PSYCH:  alert & oriented x 3 with fluent speech NEURO: no focal motor/sensory deficits  LABORATORY DATA:  I have reviewed the data as listed  .    Latest Ref Rng & Units 04/12/2023    2:38 PM 02/09/2023   10:59 AM 01/10/2023    1:02 PM  CBC  WBC 4.0 - 10.5 K/uL 4.0  3.9  3.2   Hemoglobin 13.0 - 17.0 g/dL 47.8  29.5  62.1   Hematocrit 39.0 - 52.0 % 43.5  50.4  42.8   Platelets 150 - 400 K/uL 131  151  121     .    Latest Ref Rng & Units 04/12/2023    2:38 PM 02/09/2023   10:59 AM 01/10/2023    1:02 PM  CMP  Glucose 70 - 99 mg/dL 97  95  92   BUN 8 - 23 mg/dL 19  19  23    Creatinine 0.61 - 1.24 mg/dL 3.08  6.57  8.46   Sodium 135 - 145 mmol/L 139  138  141   Potassium 3.5 - 5.1 mmol/L 4.3  4.0  4.2   Chloride 98 - 111 mmol/L 104  104  108   CO2 22 - 32 mmol/L 28  26  26    Calcium 8.9 - 10.3 mg/dL 9.4  9.3  9.4   Total Protein 6.5 - 8.1 g/dL 6.6   6.4   Total Bilirubin 0.3 - 1.2 mg/dL 0.5   0.5   Alkaline Phos 38 - 126 U/L 72   60   AST 15 - 41 U/L 22   25   ALT 0 - 44 U/L 28   25          05/24/2019 Bone Marrow Biopsy    04/16/2019 Surgical Pathology:   Surgical Pathology  CASE: [id number]  PATIENT: [facility]  Bone Marrow Report      Clinical History: Multiple myeloma, remission status unspecified (HCC)  (BH)      DIAGNOSIS:   BONE MARROW, ASPIRATE, CLOT, CORE:  -Variably cellular bone marrow with trilineage hematopoiesis and 2%  plasma cells  -See comment   PERIPHERAL BLOOD:  -Slight thrombocytopenia   COMMENT:   The bone marrow is variably cellular with trilineage hematopoiesis  including abundant megakaryocytes in the more cellular areas.  In  this  background, the plasma cells represent 2% of all cells in the aspirate  with interstitial cells and a few very minute clusters in the  clot/biopsy sections.  The latter in particular display kappa light  chain excess/restriction most suggestive of minimal residual plasma cell  neoplasm despite limited findings.  Correlation with cytogenetic and  FISH studies is recommended.    RADIOGRAPHIC STUDIES: I have personally reviewed the radiological images as listed and agreed with the findings in the report. No results found.  ASSESSMENT & PLAN:  [name] [name] is a 82 y.o. male who presents for continued management of multiple myeloma.   #Multiple myeloma: --Initially treated with Velcade/Revlimid/Dexamethasone, started on 05/22/2019. Added Zometa on 07/03/2019.  Discontinued Velcade and Revlimid after 07/10/2019 due to grade 2-3 neuropathy. He continued on Zometa infusions --PET scan from 10/15/2021 showed persistent low level radiotracer uptake associated with the large lucent lesion within the superior right acetabulum. Additionally, there is a new focal area of increased radiotracer uptake above the background low level activity within this lesion with SUV max of 3.92. On the previous exam there was relatively homogeneous low level uptake within this lesion within SUV max of 2.43.  Imaging findings are concerning for residual metabolically active tumor. --Bone marrow biopsy on 11/03/21 showed 2 % plasma cells.  --Received palliative radiation to right acetabulum from 11/30/2021-12/11/2021. Received 25 Gy in 10 Fx.  --Resumed dose reduced Revlmid 5 mg PO daily 21 days on/7 days off every 28 days on 01/03/2022   #2 Severe aortic stenosis with bicuspid aortic valve -10/26/2018 ECHO revealed AVA at 0.8 cm2 and LV EF of 60-65% -05/08/2019 pt had a Transfemoral Transcatheter Aortic Valve Replacement  #3 B/L lower extremity neuropathy -Secondary from previous myeloma treatments (velcade) and also  radiculopathy due to chronic back issues -Currently takes oxycodone and gabapentin   PLAN: -Labs from today were reviewed. WBC 4.0, Hgb 14.5, stable but mild thrombocytopenia with Plt 131. Creatinine was 1.43 but corrected calcium WNL. Myeloma labs pending today.  -Most recent myeloma labs from 01/10/2023 showed no M protein that was detected. Kappa light chains trending up measuring 79.0, ratio 6.87.  -Proceed with Zometa infusion scheduled for today. -Patient has no new symptoms suggestive of myeloma progression at this time. -Continue Revlimid 5 mg p.o. daily 3 weeks on 1 week off. -Continue Zometa q23months.  FOLLOW UP: RTC with Dr Candise Che with labs and next dose of Zometa in 3 months   All of the patient's questions were answered with apparent satisfaction. The patient knows to call the clinic with any problems, questions or concerns.  I have spent a total of 30 minutes minutes of face-to-face and non-face-to-face time, preparing to see the patient, performing a medically appropriate examination, counseling and educating the patient,documenting clinical information in the electronic health record, independently interpreting results and communicating results to the patient, and care coordination.   Georga Kaufmann PA-C Dept of Hematology and Oncology [facility] Phone: [phone]

## 2023-04-12 NOTE — Patient Instructions (Signed)

## 2023-04-19 ENCOUNTER — Other Ambulatory Visit: Payer: Self-pay | Admitting: Hematology

## 2023-04-19 DIAGNOSIS — C9000 Multiple myeloma not having achieved remission: Secondary | ICD-10-CM

## 2023-04-22 ENCOUNTER — Other Ambulatory Visit (HOSPITAL_COMMUNITY): Payer: Self-pay | Admitting: Cardiology

## 2023-04-28 ENCOUNTER — Encounter (INDEPENDENT_AMBULATORY_CARE_PROVIDER_SITE_OTHER): Payer: Self-pay

## 2023-05-15 ENCOUNTER — Other Ambulatory Visit: Payer: Self-pay | Admitting: Hematology

## 2023-05-16 ENCOUNTER — Encounter: Payer: Self-pay | Admitting: Hematology

## 2023-05-17 ENCOUNTER — Other Ambulatory Visit: Payer: Self-pay | Admitting: Hematology

## 2023-05-17 DIAGNOSIS — C9000 Multiple myeloma not having achieved remission: Secondary | ICD-10-CM

## 2023-05-18 ENCOUNTER — Other Ambulatory Visit: Payer: Self-pay

## 2023-05-18 ENCOUNTER — Encounter: Payer: Self-pay | Admitting: Hematology

## 2023-05-25 ENCOUNTER — Ambulatory Visit (INDEPENDENT_AMBULATORY_CARE_PROVIDER_SITE_OTHER): Payer: Medicare Other | Admitting: Internal Medicine

## 2023-05-25 ENCOUNTER — Encounter: Payer: Self-pay | Admitting: Internal Medicine

## 2023-05-25 VITALS — BP 130/70 | HR 61 | Temp 98.0°F | Ht 67.0 in | Wt 172.4 lb

## 2023-05-25 DIAGNOSIS — I2510 Atherosclerotic heart disease of native coronary artery without angina pectoris: Secondary | ICD-10-CM

## 2023-05-25 DIAGNOSIS — I10 Essential (primary) hypertension: Secondary | ICD-10-CM

## 2023-05-25 DIAGNOSIS — C9000 Multiple myeloma not having achieved remission: Secondary | ICD-10-CM | POA: Diagnosis not present

## 2023-05-25 DIAGNOSIS — Z952 Presence of prosthetic heart valve: Secondary | ICD-10-CM | POA: Diagnosis not present

## 2023-05-25 DIAGNOSIS — I480 Paroxysmal atrial fibrillation: Secondary | ICD-10-CM | POA: Diagnosis not present

## 2023-05-25 DIAGNOSIS — R269 Unspecified abnormalities of gait and mobility: Secondary | ICD-10-CM | POA: Diagnosis not present

## 2023-05-25 MED ORDER — OXYCODONE HCL 5 MG PO TABS: 5.0000 mg | ORAL_TABLET | Freq: Two times a day (BID) | ORAL | 0 refills | Status: DC | PRN

## 2023-05-25 NOTE — Progress Notes (Signed)
Subjective:  Patient ID: [name] [name], male    DOB: [birth year]  Age: 82 y.o. MRN: [id number]  CC: No chief complaint on file.   HPI Aasim Oelke presents for MM, A fib, CAD  Outpatient Medications Prior to Visit  Medication Sig Dispense Refill   aspirin 81 MG chewable tablet CHEW 1 TABLET BY MOUTH DAILY. 90 tablet 1   b complex vitamins tablet Take 1 tablet by mouth daily. 100 tablet 3   Calcium-Magnesium 500-250 MG TABS Take 1 tablet by mouth daily. 30 tablet 6   Carboxymethylcellul-Glycerin (LUBRICATING EYE DROPS OP) Place 1 drop into both eyes daily as needed (dry eyes).     Cholecalciferol (VITAMIN D) 50 MCG (2000 UT) tablet Take 2,000 Units by mouth daily.     diclofenac Sodium (VOLTAREN) 1 % GEL Apply 1 application topically 2 (two) times daily as needed (pain.).     ELIQUIS 5 MG TABS tablet TAKE 1 TABLET (5 MG TOTAL) BY MOUTH 2 (TWO) TIMES DAILY. NEEDS FOLLOW UP APPOINTMENT FORE MORE REFILLS 180 tablet 0   gabapentin (NEURONTIN) 300 MG capsule Take 1 capsule (300 mg total) by mouth at bedtime. 90 capsule 3   ipratropium (ATROVENT) 0.06 % nasal spray Place 2 sprays into the nose 3 (three) times daily. 15 mL 2   lenalidomide (REVLIMID) 5 MG capsule TAKE 1 CAPSULE BY MOUTH 1 TIME A DAY FOR 21 DAYS ON THEN 7 DAYS OFF 21 capsule 0   methylPREDNISolone (MEDROL DOSEPAK) 4 MG TBPK tablet As directed 21 tablet 0   metoprolol succinate (TOPROL-XL) 25 MG 24 hr tablet Take 1 tablet (25 mg total) by mouth in the morning and at bedtime. 180 tablet 1   metroNIDAZOLE (METROCREAM) 0.75 % cream Apply 1 application topically 2 (two) times daily.     polyethylene glycol powder (GLYCOLAX/MIRALAX) 17 GM/SCOOP powder Take 17-34 g by mouth 2 (two) times daily as needed for moderate constipation. 500 g 5   senna-docusate (SENOKOT-S) 8.6-50 MG tablet Take 2 tablets by mouth at bedtime.     spironolactone (ALDACTONE) 25 MG tablet TAKE 1 TABLET BY MOUTH EVERY DAY 90 tablet 3   Vitamin D, Ergocalciferol,  (DRISDOL) 1.25 MG (50000 UNIT) CAPS capsule TAKE 1 CAPSULE BY MOUTH ONE TIME PER WEEK 12 capsule 3   oxyCODONE (OXY IR/ROXICODONE) 5 MG immediate release tablet Take 1-2 tablets (5-10 mg total) by mouth 2 (two) times daily as needed for severe pain. 120 tablet 0   No facility-administered medications prior to visit.    ROS: Review of Systems  Constitutional:  Positive for fatigue and unexpected weight change. Negative for appetite change and chills.  HENT:  Negative for congestion, nosebleeds, sneezing, sore throat and trouble swallowing.   Eyes:  Negative for itching and visual disturbance.  Respiratory:  Negative for cough.   Cardiovascular:  Negative for chest pain, palpitations and leg swelling.  Gastrointestinal:  Negative for abdominal distention, blood in stool, diarrhea and nausea.  Genitourinary:  Negative for frequency and hematuria.  Musculoskeletal:  Positive for arthralgias and gait problem. Negative for back pain, joint swelling and neck pain.  Skin:  Negative for rash.  Neurological:  Positive for weakness. Negative for dizziness, tremors and speech difficulty.  Psychiatric/Behavioral:  Negative for agitation, dysphoric mood and sleep disturbance. The patient is not nervous/anxious.     Objective:  BP 130/70 (BP Location: Left Arm, Patient Position: Sitting, Cuff Size: Normal)   Pulse 61   Temp 98 F (36.7 C) (Oral)  Ht 5\' 7"  (1.702 m)   Wt 172 lb 6.4 oz (78.2 kg)   SpO2 98%   BMI 27.00 kg/m   BP Readings from Last 3 Encounters:  05/25/23 130/70  04/12/23 139/74  02/22/23 120/70    Wt Readings from Last 3 Encounters:  05/25/23 172 lb 6.4 oz (78.2 kg)  04/12/23 176 lb 1.6 oz (79.9 kg)  02/22/23 171 lb (77.6 kg)    Physical Exam Constitutional:      General: He is not in acute distress.    Appearance: Normal appearance. He is well-developed.     Comments: NAD  Eyes:     Conjunctiva/sclera: Conjunctivae normal.     Pupils: Pupils are equal, round, and  reactive to light.  Neck:     Thyroid: No thyromegaly.     Vascular: No JVD.  Cardiovascular:     Rate and Rhythm: Normal rate and regular rhythm.     Heart sounds: Normal heart sounds. No murmur heard.    No friction rub. No gallop.  Pulmonary:     Effort: Pulmonary effort is normal. No respiratory distress.     Breath sounds: Normal breath sounds. No wheezing or rales.  Chest:     Chest wall: No tenderness.  Abdominal:     General: Bowel sounds are normal. There is no distension.     Palpations: Abdomen is soft. There is no mass.     Tenderness: There is no abdominal tenderness. There is no guarding or rebound.  Musculoskeletal:        General: Tenderness present. Normal range of motion.     Cervical back: Normal range of motion.     Right lower leg: No edema.     Left lower leg: No edema.  Lymphadenopathy:     Cervical: No cervical adenopathy.  Skin:    General: Skin is warm and dry.     Findings: No rash.  Neurological:     Mental Status: He is alert and oriented to person, place, and time.     Cranial Nerves: No cranial nerve deficit.     Motor: No abnormal muscle tone.     Coordination: Coordination abnormal.     Gait: Gait abnormal.     Deep Tendon Reflexes: Reflexes are normal and symmetric.  Psychiatric:        Behavior: Behavior normal.        Thought Content: Thought content normal.        Judgment: Judgment normal.   Using a cane  Lab Results  Component Value Date   WBC 4.0 04/12/2023   HGB 14.5 04/12/2023   HCT 43.5 04/12/2023   PLT 131 (L) 04/12/2023   GLUCOSE 97 04/12/2023   CHOL 144 08/01/2018   TRIG 167.0 (H) 08/01/2018   HDL 28.20 (L) 08/01/2018   LDLDIRECT 151.5 11/20/2007   LDLCALC 82 08/01/2018   ALT 28 04/12/2023   AST 22 04/12/2023   NA 139 04/12/2023   K 4.3 04/12/2023   CL 104 04/12/2023   CREATININE 1.43 (H) 04/12/2023   BUN 19 04/12/2023   CO2 28 04/12/2023   TSH 5.338 (H) 06/23/2020   PSA 4.20 (H) 08/01/2018   INR 1.5 (H)  11/13/2022   HGBA1C 5.8 (H) 05/04/2019    DG Chest 2 View  Result Date: 02/09/2023 CLINICAL DATA:  Shortness of breath. EXAM: CHEST - 2 VIEW COMPARISON:  November 13, 2022. FINDINGS: The heart size and mediastinal contours are within normal limits. Hyperexpansion of the lungs is  noted. Status post transcatheter aortic valve repair. No acute pulmonary abnormality is noted. Probable old lower thoracic compression fracture is noted. IMPRESSION: No active cardiopulmonary disease.  Hyperexpansion of the lungs. Electronically Signed   By: Lupita Raider M.D.   On: 02/09/2023 11:45    Assessment & Plan:   Problem List Items Addressed This Visit     Essential hypertension    On Metoprolol, spironolactone      PAF (paroxysmal atrial fibrillation) (HCC) - Primary    S/p cardioversion in 01/2023 On ASA, Eliquis      Relevant Orders   TSH   CAD (coronary artery disease)    Monitor labs No CP      Relevant Orders   CBC with Differential/Platelet   Comprehensive metabolic panel   TSH   S/P TAVR (transcatheter aortic valve replacement)    Periodic ECHO F/u w/cardiology      Multiple myeloma (HCC)     F/u and labs w/Dr. Candise Che - on Revlimid Oxycodone Rx   Potential benefits of a long term opioids use as well as potential risks (i.e. addiction risk, apnea etc) and complications (i.e. Somnolence, constipation and others) were explained to the patient and were aknowledged.       Relevant Medications   oxyCODONE (OXY IR/ROXICODONE) 5 MG immediate release tablet   Other Relevant Orders   CBC with Differential/Platelet   Comprehensive metabolic panel   TSH   Gait disorder    Cont to walk. Use a cane         Meds ordered this encounter  Medications   oxyCODONE (OXY IR/ROXICODONE) 5 MG immediate release tablet    Sig: Take 1-2 tablets (5-10 mg total) by mouth 2 (two) times daily as needed for severe pain.    Dispense:  120 tablet    Refill:  0      Follow-up: Return in about 3  months (around 08/24/2023) for a follow-up visit.  Sonda Primes, MD

## 2023-05-25 NOTE — Assessment & Plan Note (Signed)
Cont to walk. Use a cane

## 2023-05-25 NOTE — Assessment & Plan Note (Signed)
Monitor labs No CP

## 2023-05-25 NOTE — Assessment & Plan Note (Addendum)
S/p cardioversion in 01/2023 On ASA, Eliquis

## 2023-05-25 NOTE — Assessment & Plan Note (Signed)
On Metoprolol, spironolactone

## 2023-05-25 NOTE — Assessment & Plan Note (Signed)
Periodic ECHO F/u w/cardiology

## 2023-05-25 NOTE — Assessment & Plan Note (Addendum)
F/u and labs w/Dr. Candise Che - on Revlimid Oxycodone Rx   Potential benefits of a long term opioids use as well as potential risks (i.e. addiction risk, apnea etc) and complications (i.e. Somnolence, constipation and others) were explained to the patient and were aknowledged.

## 2023-06-08 ENCOUNTER — Ambulatory Visit (HOSPITAL_COMMUNITY)
Admission: RE | Admit: 2023-06-08 | Discharge: 2023-06-08 | Disposition: A | Discharge status: Home / Self Care | Payer: Medicare Other | Source: Ambulatory Visit | Attending: Cardiology | Admitting: Cardiology

## 2023-06-08 ENCOUNTER — Encounter (HOSPITAL_COMMUNITY): Payer: Self-pay | Admitting: Cardiology

## 2023-06-08 VITALS — BP 128/80 | HR 64 | Wt 170.8 lb

## 2023-06-08 DIAGNOSIS — Z7901 Long term (current) use of anticoagulants: Secondary | ICD-10-CM | POA: Insufficient documentation

## 2023-06-08 DIAGNOSIS — I110 Hypertensive heart disease with heart failure: Secondary | ICD-10-CM | POA: Diagnosis not present

## 2023-06-08 DIAGNOSIS — Z953 Presence of xenogenic heart valve: Secondary | ICD-10-CM | POA: Insufficient documentation

## 2023-06-08 DIAGNOSIS — Z7982 Long term (current) use of aspirin: Secondary | ICD-10-CM | POA: Insufficient documentation

## 2023-06-08 DIAGNOSIS — Z7961 Long term (current) use of immunomodulator: Secondary | ICD-10-CM | POA: Insufficient documentation

## 2023-06-08 DIAGNOSIS — Z79899 Other long term (current) drug therapy: Secondary | ICD-10-CM | POA: Diagnosis not present

## 2023-06-08 DIAGNOSIS — I480 Paroxysmal atrial fibrillation: Secondary | ICD-10-CM | POA: Insufficient documentation

## 2023-06-08 DIAGNOSIS — Z713 Dietary counseling and surveillance: Secondary | ICD-10-CM | POA: Diagnosis not present

## 2023-06-08 DIAGNOSIS — I5032 Chronic diastolic (congestive) heart failure: Secondary | ICD-10-CM | POA: Diagnosis not present

## 2023-06-08 DIAGNOSIS — Q231 Congenital insufficiency of aortic valve: Secondary | ICD-10-CM | POA: Diagnosis not present

## 2023-06-08 DIAGNOSIS — C9000 Multiple myeloma not having achieved remission: Secondary | ICD-10-CM | POA: Diagnosis not present

## 2023-06-08 DIAGNOSIS — I452 Bifascicular block: Secondary | ICD-10-CM | POA: Diagnosis not present

## 2023-06-08 LAB — BASIC METABOLIC PANEL
Anion gap: 7 (ref 5–15)
BUN: 19 mg/dL (ref 8–23)
CO2: 27 mmol/L (ref 22–32)
Calcium: 9.1 mg/dL (ref 8.9–10.3)
Chloride: 105 mmol/L (ref 98–111)
Creatinine, Ser: 1.09 mg/dL (ref 0.61–1.24)
GFR, Estimated: >60 mL/min (ref >60)
Glucose, Bld: 95 mg/dL (ref 70–99)
Potassium: 4.2 mmol/L (ref 3.5–5.1)
Sodium: 139 mmol/L (ref 135–145)

## 2023-06-08 LAB — CBC
HCT: 46.2 % (ref 39.0–52.0)
Hemoglobin: 15.2 g/dL (ref 13.0–17.0)
MCH: 31.3 pg (ref 26.0–34.0)
MCHC: 32.9 g/dL (ref 30.0–36.0)
MCV: 95.1 fL (ref 80.0–100.0)
Platelets: 116 10*3/uL — ABNORMAL LOW (ref 150–400)
RBC: 4.86 MIL/uL (ref 4.22–5.81)
RDW: 13.4 % (ref 11.5–15.5)
WBC: 3 10*3/uL — ABNORMAL LOW (ref 4.0–10.5)
nRBC: 0 % (ref 0.0–0.2)

## 2023-06-08 LAB — BRAIN NATRIURETIC PEPTIDE: B Natriuretic Peptide: 61.4 pg/mL (ref 0.0–100.0)

## 2023-06-08 NOTE — Patient Instructions (Signed)
There has been no changes to your medications.  Labs done today, your results will be available in MyChart, we will contact you for abnormal readings.  Your physician has requested that you have an echocardiogram. Echocardiography is a painless test that uses sound waves to create images of your heart. It provides your doctor with information about the size and shape of your heart and how well your heart's chambers and valves are working. This procedure takes approximately one hour. There are no restrictions for this procedure. Please do NOT wear cologne, perfume, aftershave, or lotions (deodorant is allowed). Please arrive 15 minutes prior to your appointment time.  Your physician recommends that you schedule a follow-up appointment in: 6 months ( March 2025) ** PLEASE CALL THE OFFICE IN [place] TO ARRANGE YOUR FOLLOW UP APPOINTMENT. **  If you have any questions or concerns before your next appointment please send [place] a message through [place] or call our office at [phone].    TO LEAVE A MESSAGE FOR THE NURSE SELECT OPTION 2, PLEASE LEAVE A MESSAGE INCLUDING: YOUR NAME DATE OF BIRTH CALL BACK NUMBER REASON FOR CALL**this is important as we prioritize the call backs  YOU WILL RECEIVE A CALL BACK THE SAME DAY AS LONG AS YOU CALL BEFORE 4:00 PM  At the [facility], you and your health needs are our priority. As part of our continuing mission to provide you with exceptional heart care, we have created designated Provider Care Teams. These Care Teams include your primary Cardiologist (physician) and Advanced Practice Providers (APPs- Physician Assistants and Nurse Practitioners) who all work together to provide you with the care you need, when you need it.   You may see any of the following providers on your designated Care Team at your next follow up: Dr Arvilla Meres Dr Marca Ancona Dr. Marcos Eke, NP Robbie Lis, [place] [facility] [place], [place] Brynda Peon, NP Karle Plumber, PharmD   Please be sure to bring in all your medications bottles to every appointment.    Thank you for choosing [facility]

## 2023-06-09 NOTE — Progress Notes (Signed)
Date:  06/09/2023  ID:  [name] [name], DOB 03-05-1941, MRN [id number]  Provider location:  Advanced [facility] Type of Visit: Established patient   PCP:  [name], Georgina Quint, MD  Cardiologist: Dr. Shirlee Latch   HPI: [name] [name] is a 82 y.o. male who has history of HTN, aortic stenosis and paroxysmal atrial fibrillation. He was hospitalized with atrial fibrillation/RVR in 5/14.  He had TEE-guided cardioversion.  TEE showed bicuspid aortic valve with mild AS and a moderately dilated ascending aorta.  Most echo in 4/17 showed moderate aortic stenosis and MRA chest in 11/17 showed 4.1 cm ascending aorta.    He was on Xarelto for anticoagulation but stopped it as he was convinced it was causing joint pains.  He then refused to start any other anticoagulation at that time.     In 5/17, he had been back in atrial fibrillation for several days and was symptomatic.  I started him on Eliquis and planned TEE-guided DCCV given significant symptoms, but he converted back to NSR on his own.  He continued Eliquis for about 1 month then stopped it on his own.     In 11/17, he was hospitalized with symptomatic atrial fibrillation with RVR.  I started him on diltiazem CD and Eliquis with plan for TEE-guided DCCV.  However, he converted back to NSR on his own. He stopped the diltiazem but has continued the Eliquis.    He was admitted in 2/18 with fever, LUL PNA and left-sided pleural effusion.  Thoracentesis on left was suggestive of parapneumonic effusion.  He was in the hospital 11 days.  During that time, he went into atrial fibrillation with RVR.  He was started on amiodarone and went back into NSR.  Amiodarone was subsequently stopped.    Recurrent atrial fibrillation with RVR in 2/19, felt more fatigued.  He underwent TEE-guided DCCV back to NSR.    He had a lower GI bleed in 11/19 post-polypectomy.  He has since restarted on Eliquis.    Coronary CTA was done in 2/20, this showed mild  nonobstructive CAD.  Echo in 2/20 showed EF 60-65%, bicuspid aortic valve with severe AS.   LHC in 6/20 showed no significant coronary disease.  In 8/20, he had TAVR with Edwards Sapien 3 THV x 2 (valve in valve due to peri-valvular leak initially).  Post-procedure echoes have shown elevated gradient across the aortic valve though dimensionless index has only been in the mildly stenotic range.  Last echo in 9/20 showed mean aortic valve gradient 26 mmHg with dimensionless index 0.54.   Patient has additionally been diagnosed with multiple myeloma.  He was treated with Revlimid, Velcade, and prednisone.   He was admitted early in 1/21 with atrial fibrillation/RVR associated with ischemic colitis.  He underwent TEE-guided DCCV to NSR.   He was readmitted later in 1/21 with PNA.    He fell in 4/21, tripped and fractured his left proximal humerus.  He had ORIF in 5/21.   In 7/21, he had a left inguinal hernia repair.  Echo in 8/21 showed EF 60-65%, normal RV, ascending aorta 45 mm, stable TAVR valve with mean gradient 15 mmHg.   Echo in 8/22 showed EF 60-65% RV ok.  Bioprosthetic AVR s/p TAVR with mean gradient 13 IVC small.   Echo in 9/23 showed EF 65-70% with normal RV, PASP 34, s/p TAVR with mean gradient up to 19 mmHg.  Gated CT was then done to look at the aortic  valve more closely, this showed 4.4 cm ascending aorta with small PFO, HALT at aortic valve leaflet bases without significant restriction, this was consistent with leaflet thrombosis.   Follow up 10/23, Dr. 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  He suggested transition to warfarin with INR 2.5-3.5, but he does not want to switch to warfarin.   He was in AF 08/01/22 and advised to start amiodarone 200 mg bid.  He had worsening SOB and CP and was seen in ED 08/06/22. ECG showed AF with controlled rate. Underwent DCCV with restoration to NSR.  He stopped  amiodarone soon after that.   He returns for followup of atrial fibrillation and TAVR with HALT involving the TAVR valve.  He remains on revlimid for multiple myeloma.  Weight down 7 lbs.  Still has problems with peripheral neuropathy from prior chemotherapy.  No dyspnea with his usual activities.  No palpitations recently; he is in NSR today.  No chest pain.  No lightheadedness.    ECG (personally reviewed): NSR, LAFB  Labs (2/19): K 3.7, creatinine 0.83, hgb 16 Labs (11/19): LDL 82 Labs (1/20): TSH elevated but free T4 normal, K 4, creatinine 0.85, hgb 13.5 Labs (2/20): TSH mildly elevated but free T4 normal, K 4, creatinine 0.85 Labs (5/20): ESR 48 Labs (9/20): hgb 14.1, plts 38 => 82, K 3.3, creatinine 0.98 => 1.06 Labs (11/20): BNP 126.5 Labs (12/20): hgb 11.9, K 3.2, creatinine 0.87 Labs (1/21): K 4.8, creatinine 1.42, AST 30, ALT 60 Labs (4/21): K 4.5, creatinine 1.22, hgb 12.3, plts 122 Labs (7/21): hgb 13.7, K 4.6, creatinine 1.31, AST 36, ALT 76 Labs (3/22): K 3.7, creatinine 1.05, LFTs normal Labs (9/23): K 4.3, creatinine 1.16, hgb 14.5 Labs (11/23): K 4.1, creatinine 1.20 Labs (7/24): K 4.3, creatinine 1.43   Allergies (verified):  No Known Drug Allergies    Past Medical History:  1. Hypertension: ACEI cough. Edema with amlodipine.  2. Atrial fibrillation. The patient had new-onset atrial fibrillation in November 2009. He underwent ibutilide cardioversion successfully. He has had 1-2 episodes/year that are short-lived that likely are atrial fibrillation with RVR. He was admitted in 5/14 with atrial fibrillation/RVR and had TEE-guided DCCV.  Atrial fibrillation again in 5/17, converted back to NSR spontaneously.  CHADSVASC score 2.  - Atrial fibrillation 2/19 with TEE-guided DCCV.  - 1/21 TEE-guided DCCV 3. Hypercholesterolemia.  4. Bicuspid aortic valve disorder: Echo (7/11): EF 55-60%, mild LV hypertrophy, mild aortic stenosis with mean gradient 19 mmHg and peak gradient  36 mmHg.  TEE (5/14): EF 55%, mild LVH, bicuspid aortic valve with mild AS (mean gradient 13 mmHg), ascending aorta 4.5 cm.  Echo (9/15) with EF 65-70%, moderate AS (mean gradient 23 mmHg), mild AI, ascending aorta 4.1 cm, mild MR.   - Echo (4/17) with EF 65-70%, moderate aortic stenosis with mean gradient 27 mmHg, PASP 31 mmHg, ascending aorta 4.4 cm.  - Echo (2/18) with EF 55-60%, bicuspid aortic valve with moderate aortic stenosis (underestimated gradient).  - TEE (2/19): EF 60-65%, moderate LVH, bicuspid aortic valve with moderate AS with mean gradient 28 mmHg and AVA 1.2 cm^2, 4.4 cm ascending aorta.  - Echo (2/20): EF 60-65%, mild LVH, bicuspid aortic valve with severe AS (mean gradient 48 mmHg, AVA 0.8 cm^2).  - TAVR 8/20 with valve in valve Edwards Sapien 3 THVs (because of peri-valvular leak after initial valve placed).  - Echo (9/20): EF > 65%, mild LVH,  mild RV dilation with normal RV systolic function, mean aortic valve gradient 26 mmHg with dimensionless index 0.54, IVC normal.  - TEE (1/21): EF 60-65%, moderate LVH, normal RV, s/p valve-in-valve TAVR with mean gradient 8 mmHg and no PVL.  - Echo (8/21): EF 60-65%, normal RV, ascending aorta 45 mm, stable TAVR valve with mean gradient 15 mmHg.  - Echo (8/22): EF 70-75%, s/p TAVR mean gradient 12 mmHg.  - Echo (9/23): EF 65-70% with normal RV, PASP 34, s/p TAVR with mean gradient up to 19 mmHg. - Gated CT (9/23) showed 4.4 cm ascending aorta with small PFO, HALT at aortic valve leaflet bases without significant restriction, this was consistent with leaflet thrombosis. 5. Osteoarthritis.  6. Low back pain.  7. Chest pain: ETT-myoview (12/11) with 10:51 exercise, no chest pain, no significant ST changes, EF 69%, no evidence for ischemia or infarction.  - Coronary CTA (2/20): calcium score 41, nonobstructive CAD.  - LHC (6/20): No significant CAD.  8. Ascending aortic aneurysm: Associated with bicuspid aortic valve.  4.5 cm by TEE in 5/14.    MRA chest (6/14) with bicuspid aortic valve, 4.3 cm ascending aortic aneurysm.  MRA chest (10/15) with 4.2 cm ascending aorta (bicuspid aortic valve noted).  Echo (4/17) with ascending aorta diameter 4.4 cm.  - MRA chest (11/17) with 4.1 cm ascending aorta.  - Echo (2/19): 4.4 cm ascending aorta.  - Coronary CTA in 2/20 showed 4.3 cm ascending aorta.  - Echo (8/21) with 4.5 cm ascending aorta.  - Gated CT chest (9/23): 4.4 cm ascending aorta 9. Colonic polyps: GI bleeding in 11/19 s/p polypectomy.  10. Multiple myeloma.  11. Bradycardia 12. Thrombocytopenia: likely related to chemotherapy for multiple myeloma.  13. PVCs: Zio monitor in 10/20 showed 9.4% PVCs.  14. Chronic diastolic CHF: RHC (12/20) with mean RA 3, PA 22/2, mean PCWP 6, CI 2.74 15. Left inguinal hernia: s/p repair.    Current Outpatient Medications  Medication Sig Dispense Refill   aspirin 81 MG chewable tablet CHEW 1 TABLET BY MOUTH DAILY. 90 tablet 1   b complex vitamins tablet Take 1 tablet by mouth daily. 100 tablet 3   Calcium-Magnesium 500-250 MG TABS Take 1 tablet by mouth daily. 30 tablet 6   Carboxymethylcellul-Glycerin (LUBRICATING EYE DROPS OP) Place 1 drop into both eyes daily as needed (dry eyes).     Cholecalciferol (VITAMIN D) 50 MCG (2000 UT) tablet Take 2,000 Units by mouth daily.     diclofenac Sodium (VOLTAREN) 1 % GEL Apply 1 application topically 2 (two) times daily as needed (pain.).     ELIQUIS 5 MG TABS tablet TAKE 1 TABLET (5 MG TOTAL) BY MOUTH 2 (TWO) TIMES DAILY. NEEDS FOLLOW UP APPOINTMENT FORE MORE REFILLS 180 tablet 0   gabapentin (NEURONTIN) 300 MG capsule Take 1 capsule (300 mg total) by mouth at bedtime. 90 capsule 3   ipratropium (ATROVENT) 0.06 % nasal spray Place 2 sprays into the nose 3 (three) times daily. 15 mL 2   lenalidomide (REVLIMID) 5 MG capsule TAKE 1 CAPSULE BY MOUTH 1 TIME A DAY FOR 21 DAYS ON THEN 7 DAYS OFF 21 capsule 0   metoprolol succinate (TOPROL-XL) 25 MG 24 hr tablet  Take 1 tablet (25 mg total) by mouth in the morning and at bedtime. 180 tablet 1   metroNIDAZOLE (METROCREAM) 0.75 % cream Apply 1 application topically 2 (two) times daily.     oxyCODONE (OXY IR/ROXICODONE) 5 MG immediate release tablet Take 1-2 tablets (  5-10 mg total) by mouth 2 (two) times daily as needed for severe pain. 120 tablet 0   polyethylene glycol powder (GLYCOLAX/MIRALAX) 17 GM/SCOOP powder Take 17-34 g by mouth 2 (two) times daily as needed for moderate constipation. 500 g 5   senna-docusate (SENOKOT-S) 8.6-50 MG tablet Take 2 tablets by mouth at bedtime.     spironolactone (ALDACTONE) 25 MG tablet TAKE 1 TABLET BY MOUTH EVERY DAY 90 tablet 3   Vitamin D, Ergocalciferol, (DRISDOL) 1.25 MG (50000 UNIT) CAPS capsule TAKE 1 CAPSULE BY MOUTH ONE TIME PER WEEK 12 capsule 3   No current facility-administered medications for this encounter.   Allergies:  Xarelto [rivaroxaban], Corticosteroids, Ramipril, Zolpidem, and Benazepril   Social History:  The patient  reports that he has never smoked. He has never used smokeless tobacco. He reports that he does not currently use alcohol. He reports that he does not use drugs.   Family History:  The patient's family history includes Colon cancer (age of onset: 60) in his mother; Hypertension in an other family member.   ROS:  Please see the history of present illness.   All other systems are personally reviewed and negative.   Physical Exam:   BP 128/80   Pulse 64   Wt 77.5 kg (170 lb 12.8 oz)   SpO2 99%   BMI 26.75 kg/m  General: NAD Neck: No JVD, no thyromegaly or thyroid nodule.  Lungs: Clear to auscultation bilaterally with normal respiratory effort. CV: Nondisplaced PMI.  Heart regular S1/S2, no S3/S4, 2/6 SEM RUSB with clear S2.  No peripheral edema.  No carotid bruit.  Normal pedal pulses.  Abdomen: Soft, nontender, no hepatosplenomegaly, no distention.  Skin: Intact without lesions or rashes.  Neurologic: Alert and oriented x 3.   Psych: Normal affect. Extremities: No clubbing or cyanosis.  HEENT: Normal.   Recent Labs: 04/12/2023: ALT 28 06/08/2023: B Natriuretic Peptide 61.4; BUN 19; Creatinine, Ser 1.09; Hemoglobin 15.2; Platelets 116; Potassium 4.2; Sodium 139  Personally reviewed   Wt Readings from Last 3 Encounters:  06/08/23 77.5 kg (170 lb 12.8 oz)  05/25/23 78.2 kg (172 lb 6.4 oz)  04/12/23 79.9 kg (176 lb 1.6 oz)    ASSESSMENT AND PLAN: 1. Atrial fibrillation: Paroxysmal. He is quite symptomatic when in atrial fibrillation.  CHADSVASC = 3. Required DCCV 11/23 in ED. He has since stopped his amiodarone and is not interested in restarting.  He is in NSR today.  - Continue Eliquis. CBC today.  - He has not wanted atrial fibrillation ablation.   - Continue Toprol XL 25 mg bid.  2. Peripheral neuropathy: Patient developed a very painful peripheral neuropathy on Velcade.  This is still very symptomatic.  3. Bradycardia: After TAVR, patient had RBBB and LAFB with HR down to the 40s.  RBBB has resolved, HR now in 60s, and he is on Toprol XL 25 bid.   4. PVCs: Zio monitor in 10/20 showed 9.4% PVCs.  He is asymptomatic. 5. HTN: Stable.  - Continue spironolactone 25 mg daily. BMET today.  6. Bicuspid aortic valve disorder: S/P TAVR with 2 Edwards Sapien THVs (valve-in-valve due to peri-valvular leak after 1st valve placed). Post-op, mean gradient across the aortic valve was elevated, 26 mmHg in 9/20.  Dimensionless index, in 9/20 0.54, only suggests mild bioprosthetic aortic stenosis.  Echo in 1/21 showed mean gradient down to 10 mmHg and TEE showed mean gradient 8 mmHg with no peri-valvular leakage.  Echo in 8/22 with mean gradient 12  mmHg.  Echo in 9/23 showed increase mean gradient up to 19 mmHg across bioprosthetic aortic valve.  Gated CT chest showed HALT at leaflet bases without restriction suggestive of leaflet thrombosis.  - When this was found, I recommended that he transition from apixaban to warfarin with  INR 2.5-3.5. He did not want to take warfarin given need for monitoring and restriction on diet. Therefore, he will continue ASA 81 mg daily + Eliquis.  - He is overdue for repeat echo to reassess aortic valve gradient, will order again today.   7. Ascending aortic aneurysm: CT chest in 9/23 showed 4.4 cm ascending aorta.  - Follow aorta by echo for now, he would likely be a poor candidate for ascending aorta replacement given frailty and age.  8. Chronic diastolic CHF:  He is not volume overloaded on exam.  He does not take Lasix.  9. Multiple myeloma: He continues on Revlimid.     Follow up in 6 months but needs echo soon.   Signed, [name] Ancona, MD  06/09/2023  [facility] [address] [facility] [place] [address] [phone] (office) [phone] (fax)

## 2023-06-14 ENCOUNTER — Other Ambulatory Visit: Payer: Self-pay | Admitting: Hematology

## 2023-06-14 DIAGNOSIS — C9000 Multiple myeloma not having achieved remission: Secondary | ICD-10-CM

## 2023-07-11 ENCOUNTER — Other Ambulatory Visit: Payer: Self-pay

## 2023-07-11 DIAGNOSIS — C9000 Multiple myeloma not having achieved remission: Secondary | ICD-10-CM

## 2023-07-12 ENCOUNTER — Other Ambulatory Visit: Payer: Self-pay | Admitting: Hematology

## 2023-07-12 DIAGNOSIS — C9000 Multiple myeloma not having achieved remission: Secondary | ICD-10-CM

## 2023-07-12 NOTE — Telephone Encounter (Signed)
Refilled per Dr. Candise Che verbal order

## 2023-07-13 ENCOUNTER — Inpatient Hospital Stay: Payer: Medicare Other | Attending: Hematology

## 2023-07-13 ENCOUNTER — Inpatient Hospital Stay (HOSPITAL_BASED_OUTPATIENT_CLINIC_OR_DEPARTMENT_OTHER): Payer: Medicare Other | Admitting: Hematology

## 2023-07-13 ENCOUNTER — Other Ambulatory Visit (HOSPITAL_COMMUNITY): Payer: Medicare Other

## 2023-07-13 ENCOUNTER — Inpatient Hospital Stay: Payer: Medicare Other

## 2023-07-13 VITALS — BP 126/70 | HR 57 | Temp 97.7°F | Resp 16 | Wt 173.5 lb

## 2023-07-13 DIAGNOSIS — C9000 Multiple myeloma not having achieved remission: Secondary | ICD-10-CM

## 2023-07-13 DIAGNOSIS — C7951 Secondary malignant neoplasm of bone: Secondary | ICD-10-CM

## 2023-07-13 DIAGNOSIS — Z7189 Other specified counseling: Secondary | ICD-10-CM

## 2023-07-13 LAB — CMP (CANCER CENTER ONLY)
ALT: 23 U/L (ref 0–44)
AST: 21 U/L (ref 15–41)
Albumin: 4.3 g/dL (ref 3.5–5.0)
Alkaline Phosphatase: 57 U/L (ref 38–126)
Anion gap: 5 (ref 5–15)
BUN: 18 mg/dL (ref 8–23)
CO2: 28 mmol/L (ref 22–32)
Calcium: 9.1 mg/dL (ref 8.9–10.3)
Chloride: 106 mmol/L (ref 98–111)
Creatinine: 1.14 mg/dL (ref 0.61–1.24)
GFR, Estimated: >60 mL/min (ref >60)
Glucose, Bld: 96 mg/dL (ref 70–99)
Potassium: 4.1 mmol/L (ref 3.5–5.1)
Sodium: 139 mmol/L (ref 135–145)
Total Bilirubin: 0.5 mg/dL (ref 0.3–1.2)
Total Protein: 6.4 g/dL — ABNORMAL LOW (ref 6.5–8.1)

## 2023-07-13 LAB — CBC WITH DIFFERENTIAL (CANCER CENTER ONLY)
Abs Immature Granulocytes: 0.02 10*3/uL (ref 0.00–0.07)
Basophils Absolute: 0.1 10*3/uL (ref 0.0–0.1)
Basophils Relative: 1 %
Eosinophils Absolute: 0.2 10*3/uL (ref 0.0–0.5)
Eosinophils Relative: 5 %
HCT: 43.9 % (ref 39.0–52.0)
Hemoglobin: 14.3 g/dL (ref 13.0–17.0)
Immature Granulocytes: 1 %
Lymphocytes Relative: 20 %
Lymphs Abs: 0.8 10*3/uL (ref 0.7–4.0)
MCH: 30.8 pg (ref 26.0–34.0)
MCHC: 32.6 g/dL (ref 30.0–36.0)
MCV: 94.6 fL (ref 80.0–100.0)
Monocytes Absolute: 0.5 10*3/uL (ref 0.1–1.0)
Monocytes Relative: 12 %
Neutro Abs: 2.4 10*3/uL (ref 1.7–7.7)
Neutrophils Relative %: 61 %
Platelet Count: 99 10*3/uL — ABNORMAL LOW (ref 150–400)
RBC: 4.64 MIL/uL (ref 4.22–5.81)
RDW: 13.3 % (ref 11.5–15.5)
WBC Count: 4 10*3/uL (ref 4.0–10.5)
nRBC: 0 % (ref 0.0–0.2)

## 2023-07-13 MED ORDER — ZOLEDRONIC ACID 4 MG/100ML IV SOLN
4.0000 mg | Freq: Once | INTRAVENOUS | Status: AC
  Administered 2023-07-13: 4 mg via INTRAVENOUS
  Filled 2023-07-13: qty 100

## 2023-07-13 NOTE — Patient Instructions (Signed)

## 2023-07-13 NOTE — Progress Notes (Signed)
HEMATOLOGY/ONCOLOGY CLINIC NOTE  Date of Service: 07/13/23    Patient Care Team: [name], Georgina Quint, MD as PCP - General Laurey Morale, MD as PCP - Advanced Heart Failure (Cardiology) Laurey Morale, MD as PCP - Cardiology (Cardiology) Maeola Harman, MD as Attending Physician (Neurosurgery) Donzetta Starch, MD as Consulting Physician (Dermatology) Oretha Milch, MD as Consulting Physician (Pulmonary Disease) Glendale Chard, DO as Consulting Physician (Neurology)   CHIEF COMPLAINTS/PURPOSE OF CONSULTATION:  Follow-up for continued evaluation and management of multiple myeloma  HISTORY OF PRESENTING ILLNESS:  Please see previous notes for details on initial presentation  INTERVAL HISTORY:  [name] [name] Is a 82 y.o. male is here for continued evaluation and management of multiple myeloma.  Patient was found to have pneumonia in March 2024. He was seen by me on 01/10/2023 and reported continuous bilateral leg pain and bilateral leg/hand cramps due to neuropathy. He also complained of posterior neck discomfort.   She was seen by [facility] 04/12/2023 and complained of persistent neuropathic pain in lower extremities.   Today, he reports that he has been feeling well overall since his last visit. Patient complains of mild leg weakness related to his neuropathy. Patient continues to use Oxycodone once a day for pain management. Patient notes no leg pain in the mornings or when he is driving. He sometimes endorses hand cramping.   He reports stable back pain. Patient reports that when he first lies down in bed, he previously noticed bone cracking. He does not have any cracking at this time, but may have arthritis symptoms in his back.   Patient reports stable urinary frequency, needing to use the restroom about once every hour.   Patient has been tolerating Revlimid well with no toxicity issues. He denies any nausea or diarrhea.   Patient denies any bleeding issues with blood  thinners.   He continues to take vitamin D and B complex regularly.   Patient has no issues with his bone-strengthening infusions every 3 months. He denies any new dental issues.   He reports a history of atrial fibrillation. His pulse and blood pressure are fairly well controlled.   He denies any infection issues and has no other new symptoms.   Patient reports that he generally receives the flu shot annually but has not received the flu shot yet this season.   He continues to stays active and continues to mow the lawn. He regularly goes to church 2-3 times a week.    MEDICAL HISTORY:  Past Medical History:  Diagnosis Date   Ascending aortic aneurysm (HCC)    Bicuspid aortic valve    Cancer (HCC)    CHF NYHA class I (no symptoms from ordinary activities), acute, diastolic (HCC)    Dysrhythmia 7829   A fib   Fatty liver    mild   Fracture    left proximal humerus   GERD (gastroesophageal reflux disease)    GI bleeding 07/21/2018   post polypectomy   Hemorrhoids    HTN (hypertension)    Hypercholesteremia    Hypokalemia    Internal hemorrhoids    LBP (low back pain)    Moderate aortic stenosis    Osteoarthritis    Paroxysmal atrial fibrillation (HCC)    a. new onset Afib in 07/2008. He underwent ibutilide cardioversion successfully. b. Recurrence 01/2013 s/p TEE/DCCV - was on Xarelto but he stopped it as he was convinced it was causing joint pn. c. Recurrence 01/2016 - spont conv  to NSR. Pt took Eliquis x1 mo then declined further anticoag. d. Recurrence 07/2016.   Pneumonia    Tubular adenoma of colon     SURGICAL HISTORY: Past Surgical History:  Procedure Laterality Date   BACK SURGERY  x12 years ago   CARDIAC CATHETERIZATION  2020   CARDIAC VALVE REPLACEMENT  2020   CARDIOVERSION N/A 01/26/2013   Procedure: CARDIOVERSION;  Surgeon: Laurey Morale, MD;  Location: [facility] ENDOSCOPY;  Service: Cardiovascular;  Laterality: N/A;   CARDIOVERSION N/A 10/28/2017   Procedure:  CARDIOVERSION;  Surgeon: Laurey Morale, MD;  Location: [facility] ENDOSCOPY;  Service: Cardiovascular;  Laterality: N/A;   CARDIOVERSION N/A 03/03/2018   Procedure: CARDIOVERSION;  Surgeon: Lewayne Bunting, MD;  Location: [facility] ENDOSCOPY;  Service: Cardiovascular;  Laterality: N/A;   CARDIOVERSION N/A 09/19/2019   Procedure: CARDIOVERSION;  Surgeon: Laurey Morale, MD;  Location: [facility] ENDOSCOPY;  Service: Cardiovascular;  Laterality: N/A;   COLONOSCOPY     COLONOSCOPY  07/17/2018   at [facility]   HEMORRHOID SURGERY     INGUINAL HERNIA REPAIR Left 03/18/2020   Procedure: OPEN LEFT INGUINAL HERNIA REPAIR;  Surgeon: Ovidio Kin, MD;  Location: [facility] OR;  Service: General;  Laterality: Left;   LUMBAR LAMINECTOMY     ORIF HUMERUS FRACTURE Left 01/15/2020   Procedure: OPEN REDUCTION INTERNAL FIXATION (ORIF) PROXIMAL HUMERUS FRACTURE;  Surgeon: Yolonda Kida, MD;  Location: [facility] OR;  Service: Orthopedics;  Laterality: Left;   POLYPECTOMY     RIGHT HEART CATH N/A 08/20/2019   Procedure: RIGHT HEART CATH;  Surgeon: Laurey Morale, MD;  Location: [facility] INVASIVE CV LAB;  Service: Cardiovascular;  Laterality: N/A;   RIGHT/LEFT HEART CATH AND CORONARY ANGIOGRAPHY N/A 03/07/2019   Procedure: RIGHT/LEFT HEART CATH AND CORONARY ANGIOGRAPHY;  Surgeon: Kathleene Hazel, MD;  Location: [facility] INVASIVE CV LAB;  Service: Cardiovascular;  Laterality: N/A;   TAVAR  04/2019   TEE WITHOUT CARDIOVERSION N/A 01/26/2013   Procedure: TRANSESOPHAGEAL ECHOCARDIOGRAM (TEE);  Surgeon: Laurey Morale, MD;  Location: [facility] ENDOSCOPY;  Service: Cardiovascular;  Laterality: N/A;   TEE WITHOUT CARDIOVERSION N/A 10/28/2017   Procedure: TRANSESOPHAGEAL ECHOCARDIOGRAM (TEE);  Surgeon: Laurey Morale, MD;  Location: [facility] ENDOSCOPY;  Service: Cardiovascular;  Laterality: N/A;   TEE WITHOUT CARDIOVERSION N/A 05/08/2019   Procedure: TRANSESOPHAGEAL ECHOCARDIOGRAM (TEE);  Surgeon: Kathleene Hazel, MD;  Location: [facility] INVASIVE CV LAB;  Service: Open  Heart Surgery;  Laterality: N/A;   TEE WITHOUT CARDIOVERSION N/A 09/19/2019   Procedure: TRANSESOPHAGEAL ECHOCARDIOGRAM (TEE);  Surgeon: Laurey Morale, MD;  Location: [facility] ENDOSCOPY;  Service: Cardiovascular;  Laterality: N/A;   TRANSCATHETER AORTIC VALVE REPLACEMENT, TRANSFEMORAL N/A 05/08/2019   Procedure: TRANSCATHETER AORTIC VALVE REPLACEMENT, TRANSFEMORAL;  Surgeon: Kathleene Hazel, MD;  Location: [facility] INVASIVE CV LAB;  Service: Open Heart Surgery;  Laterality: N/A;    SOCIAL HISTORY: Social History   Socioeconomic History   Marital status: Married    Spouse name: Not on file   Number of children: 0   Years of education: Not on file   Highest education level: Not on file  Occupational History   Occupation: Retired Garment/textile technologist: [employer]  Tobacco Use   Smoking status: Never   Smokeless tobacco: Never  Vaping Use   Vaping status: Never Used  Substance and Sexual Activity   Alcohol use: Not Currently   Drug use: No   Sexual activity: Yes  Other Topics Concern   Not on file  Social History Narrative   Patient lives in [place] w/ his wife. He is a native of [place]. 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   Right-handed   Caffeine: 2 cups coffee per day   Two story home   Social Determinants of Health   Financial Resource Strain: Not on file  Food Insecurity: No Food Insecurity (02/15/2023)   Hunger Vital Sign    Worried About Running Out of Food in the Last Year: Never true    Ran Out of Food in the Last Year: Never true  Transportation Needs: No Transportation Needs (02/15/2023)   PRAPARE - Administrator, Civil Service (Medical): No    Lack of Transportation (Non-Medical): No  Physical Activity: Not on file  Stress: Not on file  Social Connections: Not on file  Intimate Partner Violence: Not on file    FAMILY HISTORY: Family History  Problem Relation Age of Onset   Colon cancer Mother 78    Hypertension Other    Coronary artery disease Neg Hx    Colon polyps Neg Hx    Esophageal cancer Neg Hx    Rectal cancer Neg Hx    Stomach cancer Neg Hx     ALLERGIES:  is allergic to xarelto [rivaroxaban], corticosteroids, ramipril, zolpidem, and benazepril.  MEDICATIONS:  Current Outpatient Medications  Medication Sig Dispense Refill   aspirin 81 MG chewable tablet CHEW 1 TABLET BY MOUTH DAILY. 90 tablet 1   b complex vitamins tablet Take 1 tablet by mouth daily. 100 tablet 3   Calcium-Magnesium 500-250 MG TABS Take 1 tablet by mouth daily. 30 tablet 6   Carboxymethylcellul-Glycerin (LUBRICATING EYE DROPS OP) Place 1 drop into both eyes daily as needed (dry eyes).     Cholecalciferol (VITAMIN D) 50 MCG (2000 UT) tablet Take 2,000 Units by mouth daily.     diclofenac Sodium (VOLTAREN) 1 % GEL Apply 1 application topically 2 (two) times daily as needed (pain.).     ELIQUIS 5 MG TABS tablet TAKE 1 TABLET (5 MG TOTAL) BY MOUTH 2 (TWO) TIMES DAILY. NEEDS FOLLOW UP APPOINTMENT FORE MORE REFILLS 180 tablet 0   gabapentin (NEURONTIN) 300 MG capsule Take 1 capsule (300 mg total) by mouth at bedtime. 90 capsule 3   ipratropium (ATROVENT) 0.06 % nasal spray Place 2 sprays into the nose 3 (three) times daily. 15 mL 2   lenalidomide (REVLIMID) 5 MG capsule TAKE 1 CAPSULE BY MOUTH 1 TIME A DAY FOR 21 DAYS ON THEN 7 DAYS OFF 21 capsule 0   metoprolol succinate (TOPROL-XL) 25 MG 24 hr tablet Take 1 tablet (25 mg total) by mouth in the morning and at bedtime. 180 tablet 1   metroNIDAZOLE (METROCREAM) 0.75 % cream Apply 1 application topically 2 (two) times daily.     oxyCODONE (OXY IR/ROXICODONE) 5 MG immediate release tablet Take 1-2 tablets (5-10 mg total) by mouth 2 (two) times daily as needed for severe pain. 120 tablet 0   polyethylene glycol powder (GLYCOLAX/MIRALAX) 17 GM/SCOOP powder Take 17-34 g by mouth 2 (two) times daily as needed for moderate constipation. 500 g 5   senna-docusate  (SENOKOT-S) 8.6-50 MG tablet Take 2 tablets by mouth at bedtime.     spironolactone (ALDACTONE) 25 MG tablet TAKE 1 TABLET BY MOUTH EVERY DAY 90 tablet 3   Vitamin D, Ergocalciferol, (DRISDOL) 1.25 MG (50000 UNIT) CAPS capsule TAKE 1 CAPSULE BY MOUTH ONE TIME PER WEEK 12 capsule  3   No current facility-administered medications for this visit.    REVIEW OF SYSTEMS:    10 Point review of Systems was done is negative except as noted above.   PHYSICAL EXAMINATION: There were no vitals filed for this visit.   Wt Readings from Last 3 Encounters:  06/08/23 170 lb 12.8 oz (77.5 kg)  05/25/23 172 lb 6.4 oz (78.2 kg)  04/12/23 176 lb 1.6 oz (79.9 kg)   There is no height or weight on file to calculate BMI.    ECOG FS:2 - Symptomatic, <50% confined to bed GENERAL:alert, in no acute distress and comfortable SKIN: no acute rashes, no significant lesions EYES: conjunctiva are pink and non-injected, sclera anicteric OROPHARYNX: MMM, no exudates, no oropharyngeal erythema or ulceration NECK: supple, no JVD LYMPH:  no palpable lymphadenopathy in the cervical, axillary or inguinal regions LUNGS: clear to auscultation b/l with normal respiratory effort HEART: regular rate & rhythm ABDOMEN:  normoactive bowel sounds , non tender, not distended. Extremity: no pedal edema PSYCH: alert & oriented x 3 with fluent speech NEURO: no focal motor/sensory deficits   LABORATORY DATA:  I have reviewed the data as listed  .    Latest Ref Rng & Units 07/13/2023    1:52 PM 06/08/2023   11:14 AM 04/12/2023    2:38 PM  CBC  WBC 4.0 - 10.5 K/uL 4.0  3.0  4.0   Hemoglobin 13.0 - 17.0 g/dL 75.6  43.3  29.5   Hematocrit 39.0 - 52.0 % 43.9  46.2  43.5   Platelets 150 - 400 K/uL 99  116  131     .    Latest Ref Rng & Units 07/13/2023    1:52 PM 06/08/2023   11:14 AM 04/12/2023    2:38 PM  CMP  Glucose 70 - 99 mg/dL 96  95  97   BUN 8 - 23 mg/dL 18  19  19    Creatinine 0.61 - 1.24 mg/dL 1.88  4.16  6.06    Sodium 135 - 145 mmol/L 139  139  139   Potassium 3.5 - 5.1 mmol/L 4.1  4.2  4.3   Chloride 98 - 111 mmol/L 106  105  104   CO2 22 - 32 mmol/L 28  27  28    Calcium 8.9 - 10.3 mg/dL 9.1  9.1  9.4   Total Protein 6.5 - 8.1 g/dL 6.4   6.6   Total Bilirubin 0.3 - 1.2 mg/dL 0.5   0.5   Alkaline Phos 38 - 126 U/L 57   72   AST 15 - 41 U/L 21   22   ALT 0 - 44 U/L 23   28          05/24/2019 Bone Marrow Biopsy    04/16/2019 Surgical Pathology:   Surgical Pathology  CASE: [id number]  PATIENT: [facility]  Bone Marrow Report      Clinical History: Multiple myeloma, remission status unspecified (HCC)  (BH)      DIAGNOSIS:   BONE MARROW, ASPIRATE, CLOT, CORE:  -Variably cellular bone marrow with trilineage hematopoiesis and 2%  plasma cells  -See comment   PERIPHERAL BLOOD:  -Slight thrombocytopenia   COMMENT:   The bone marrow is variably cellular with trilineage hematopoiesis  including abundant megakaryocytes in the more cellular areas.  In this  background, the plasma cells represent 2% of all cells in the aspirate  with interstitial cells and a few very minute clusters in the  clot/biopsy sections.  The latter in particular display kappa light  chain excess/restriction most suggestive of minimal residual plasma cell  neoplasm despite limited findings.  Correlation with cytogenetic and  FISH studies is recommended.    RADIOGRAPHIC STUDIES: I have personally reviewed the radiological images as listed and agreed with the findings in the report. No results found.  ASSESSMENT & PLAN:   82 y.o. male with:  #1 Plasma cell myeloma-currently in relapse  03/28/2019 MRI pelvis w/wo contrast revealed "1. Destructive bone lesions as detailed above. Findings most consistent with metastatic disease. PET-CT may be helpful for further evaluation and to establish a primary tumor. The right pelvic bone lesions should be amenable to image guided biopsy but a PET  scan may demonstrate easier/safer biopsy sites. 2. No intrapelvic mass or adenopathy. 3. Benign intraosseous lipoma involving the left anterior superior acetabulum."  04/06/2019 PET whole body revealed "1. Diffuse osseous metastatic disease as detailed above without findings for a primary neoplasm in the chest, abdomen or pelvis. The large destructive lesion involving the right ischium should be amenable to image guided biopsy. 2. Two small retroperitoneal lymph nodes and 1 small right obturator node showing hypermetabolism."   04/16/2019 Posterior right pelvis bone biopsy revealed "PLASMA CELL NEOPLASM"  05/24/2019 Bone Marrow Biopsy revealed "BONE MARROW: - CELLULAR MARROW WITH INVOLVEMENT BY PLASMA CELL NEOPLASM (20%) PERIPHERAL BLOOD: - MORPHOLOGICALLY UNREMARKABLE"  05/24/2019 FISH Panel revealed "ABNORMAL result with 11q+, 14q+ and +17"  #2 Severe aortic stenosis with bicuspid aortic valve -10/26/2018 ECHO revealed AVA at 0.8 cm2 and LV EF of 60-65% -05/08/2019 pt had a Transfemoral Transcatheter Aortic Valve Replacement  PET CT scan from 2/2 which shows There is persistent low level radiotracer uptake associated with the large lucent lesion within the superior right acetabulum. Additionally, there is a new focal area of increased radiotracer uptake above the background low level activity within this lesion with SUV max of 3.92. On the previous exam there was relatively homogeneous low level uptake within this lesion within SUV max of 2.43. Imaging findings are concerning for residual metabolically active tumor. Patient has seen radiation oncology since his last visit and they have decided to proceed with involved site radiation therapy to the FDG avid lesion to reduce the risk of fracture.  -He had his bone marrow biopsy on 11/03/2021 which shows 2% abnormal plasma cells.  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 and also radiculopathy due to chronic back  issues  PLAN:  -Discussed lab results on 07/13/23 in detail with patient. CBC normal, showed WBC of 4.0K, hemoglobin of 14.3, and platelets of 99K. -CMP normal -last myeloma panel from 3 months ago showed that his M protein continued to be undetectable and he continued to be in remission -myeloma panel from todayshows M spike of 0.2g/dl -Patient has no new symptoms suggestive of myeloma progression at this time.  -K/L light chain ratio was 5.82 three months ago and is higher at 7.99 -educated patient that K/L light chains can fluctuate if the kidneys are not clearing the urine well.  -continue Oxycodone as needed for pain management -continue maintenance Revlimid 5 mg p.o. daily 3 weeks on 1 week off.  -Proceed with Zometa today and continue Zometa q25months.  -continue to take vitamin D and B complex regularly -recommend patient to stay UTD with his vaccines, including flu shot, RSV, and COVID-19 vaccines. Patient does intend to receive the flu shot and does not wish to receive the RSV vaccine.  -will continue to  monitor with labs in 3 months -continue to stay regularly active -answered all of patient's questions in detail  FOLLOW UP: RTC with Dr Candise Che with labs and next dose of Zometa in 3 months   The total time spent in the appointment was 30 minutes* .  All of the patient's questions were answered with apparent satisfaction. The patient knows to call the clinic with any problems, questions or concerns.   Wyvonnia Lora MD MS AAHIVMS [facility] Hematology/Oncology Physician [facility]  .*Total Encounter Time as defined by the Centers for Medicare and Medicaid Services includes, in addition to the face-to-face time of a patient visit (documented in the note above) non-face-to-face time: obtaining and reviewing outside history, ordering and reviewing medications, tests or procedures, care coordination (communications with other health care professionals or caregivers) and  documentation in the medical record.    I,Mitra Faeizi,acting as a Neurosurgeon for Wyvonnia Lora, MD.,have documented all relevant documentation on the behalf of Wyvonnia Lora, MD,as directed by  Wyvonnia Lora, MD while in the presence of Wyvonnia Lora, MD.  .I have reviewed the above documentation for accuracy and completeness, and I agree with the above. Johney Maine MD

## 2023-07-15 ENCOUNTER — Other Ambulatory Visit (HOSPITAL_COMMUNITY): Payer: Medicare Other

## 2023-07-15 LAB — KAPPA/LAMBDA LIGHT CHAINS
Kappa free light chain: 90.3 mg/L — ABNORMAL HIGH (ref 3.3–19.4)
Kappa, lambda light chain ratio: 7.99 — ABNORMAL HIGH (ref 0.26–1.65)
Lambda free light chains: 11.3 mg/L (ref 5.7–26.3)

## 2023-07-19 ENCOUNTER — Other Ambulatory Visit (HOSPITAL_COMMUNITY): Payer: Self-pay | Admitting: Cardiology

## 2023-07-19 ENCOUNTER — Encounter: Payer: Self-pay | Admitting: Hematology

## 2023-07-19 LAB — MULTIPLE MYELOMA PANEL, SERUM
Albumin SerPl Elph-Mcnc: 3.8 g/dL (ref 2.9–4.4)
Albumin/Glob SerPl: 1.7 (ref 0.7–1.7)
Alpha 1: 0.2 g/dL (ref 0.0–0.4)
Alpha2 Glob SerPl Elph-Mcnc: 0.5 g/dL (ref 0.4–1.0)
B-Globulin SerPl Elph-Mcnc: 0.9 g/dL (ref 0.7–1.3)
Gamma Glob SerPl Elph-Mcnc: 0.7 g/dL (ref 0.4–1.8)
Globulin, Total: 2.3 g/dL (ref 2.2–3.9)
IgA: 120 mg/dL (ref 61–437)
IgG (Immunoglobin G), Serum: 687 mg/dL (ref 603–1613)
IgM (Immunoglobulin M), Srm: 36 mg/dL (ref 15–143)
M Protein SerPl Elph-Mcnc: 0.2 g/dL — ABNORMAL HIGH
Total Protein ELP: 6.1 g/dL (ref 6.0–8.5)

## 2023-07-20 ENCOUNTER — Ambulatory Visit (HOSPITAL_COMMUNITY)
Admission: RE | Admit: 2023-07-20 | Discharge: 2023-07-20 | Disposition: A | Discharge status: Home / Self Care | Payer: Medicare Other | Source: Ambulatory Visit | Attending: Cardiology | Admitting: Cardiology

## 2023-07-20 DIAGNOSIS — I5032 Chronic diastolic (congestive) heart failure: Secondary | ICD-10-CM | POA: Diagnosis not present

## 2023-07-20 DIAGNOSIS — I340 Nonrheumatic mitral (valve) insufficiency: Secondary | ICD-10-CM | POA: Diagnosis not present

## 2023-07-20 DIAGNOSIS — I110 Hypertensive heart disease with heart failure: Secondary | ICD-10-CM | POA: Diagnosis not present

## 2023-07-20 DIAGNOSIS — Z952 Presence of prosthetic heart valve: Secondary | ICD-10-CM | POA: Diagnosis not present

## 2023-07-20 DIAGNOSIS — E785 Hyperlipidemia, unspecified: Secondary | ICD-10-CM | POA: Insufficient documentation

## 2023-07-20 LAB — ECHOCARDIOGRAM COMPLETE
AR max vel: 1.43 cm2
AV Area VTI: 1.74 cm2
AV Area mean vel: 1.37 cm2
AV Mean grad: 13 mm[Hg]
AV Peak grad: 22.5 mm[Hg]
Ao pk vel: 2.37 m/s
Area-P 1/2: 2.45 cm2
S' Lateral: 2.6 cm

## 2023-07-20 NOTE — Progress Notes (Signed)
  Echocardiogram 2D Echocardiogram has been performed.  Delcie Roch 07/20/2023, 4:33 PM

## 2023-08-04 ENCOUNTER — Encounter: Payer: Self-pay | Admitting: Hematology

## 2023-08-04 NOTE — Telephone Encounter (Signed)
Telephone call  

## 2023-08-06 ENCOUNTER — Other Ambulatory Visit (HOSPITAL_COMMUNITY): Payer: Self-pay | Admitting: Cardiology

## 2023-08-09 ENCOUNTER — Other Ambulatory Visit: Payer: Self-pay | Admitting: Hematology

## 2023-08-09 DIAGNOSIS — C9000 Multiple myeloma not having achieved remission: Secondary | ICD-10-CM

## 2023-08-17 DIAGNOSIS — Z23 Encounter for immunization: Secondary | ICD-10-CM | POA: Diagnosis not present

## 2023-08-25 ENCOUNTER — Ambulatory Visit: Payer: Medicare Other | Admitting: Internal Medicine

## 2023-08-25 ENCOUNTER — Encounter: Payer: Self-pay | Admitting: Internal Medicine

## 2023-08-25 VITALS — BP 110/60 | HR 60 | Temp 98.3°F | Ht 67.0 in | Wt 174.0 lb

## 2023-08-25 DIAGNOSIS — C9000 Multiple myeloma not having achieved remission: Secondary | ICD-10-CM

## 2023-08-25 DIAGNOSIS — G8929 Other chronic pain: Secondary | ICD-10-CM | POA: Diagnosis not present

## 2023-08-25 DIAGNOSIS — M545 Low back pain, unspecified: Secondary | ICD-10-CM

## 2023-08-25 DIAGNOSIS — I480 Paroxysmal atrial fibrillation: Secondary | ICD-10-CM | POA: Diagnosis not present

## 2023-08-25 DIAGNOSIS — R197 Diarrhea, unspecified: Secondary | ICD-10-CM

## 2023-08-25 DIAGNOSIS — C7951 Secondary malignant neoplasm of bone: Secondary | ICD-10-CM | POA: Diagnosis not present

## 2023-08-25 MED ORDER — OXYCODONE HCL 5 MG PO TABS: 5.0000 mg | ORAL_TABLET | Freq: Two times a day (BID) | ORAL | 0 refills | Status: DC | PRN

## 2023-08-25 MED ORDER — IPRATROPIUM BROMIDE 0.06 % NA SOLN: 2.0000 | Freq: Three times a day (TID) | NASAL | 2 refills | Status: DC

## 2023-08-25 NOTE — Assessment & Plan Note (Signed)
Doing better.   

## 2023-08-25 NOTE — Assessment & Plan Note (Signed)
Chronic Oxycodone prn  Potential benefits of a long term opioids use as well as potential risks (i.e. addiction risk, apnea etc) and complications (i.e. Somnolence, constipation and others) were explained to the patient and were aknowledged.

## 2023-08-25 NOTE — Assessment & Plan Note (Signed)
Chronic Oxycodone prn  Potential benefits of a long term opioids use as well as potential risks (i.e. addiction risk, apnea etc) and complications (i.e. Somnolence, constipation and others) were explained to the patient and were aknowledged. Dr. Candise Che

## 2023-08-25 NOTE — Assessment & Plan Note (Signed)
S/p cardioversion in 01/2023 On ASA, Eliquis

## 2023-08-25 NOTE — Progress Notes (Signed)
Subjective:  Patient ID: [name] [name], male    DOB: 22-Jun-1941  Age: 82 y.o. MRN: [id number]  CC: Medical Management of Chronic Issues (3 mnth f/u)   HPI [name] [name] presents for MM,   Outpatient Medications Prior to Visit  Medication Sig Dispense Refill   aspirin 81 MG chewable tablet CHEW 1 TABLET BY MOUTH DAILY. 90 tablet 1   b complex vitamins tablet Take 1 tablet by mouth daily. 100 tablet 3   Calcium-Magnesium 500-250 MG TABS Take 1 tablet by mouth daily. 30 tablet 6   Carboxymethylcellul-Glycerin (LUBRICATING EYE DROPS OP) Place 1 drop into both eyes daily as needed (dry eyes).     Cholecalciferol (VITAMIN D) 50 MCG (2000 UT) tablet Take 2,000 Units by mouth daily.     diclofenac Sodium (VOLTAREN) 1 % GEL Apply 1 application topically 2 (two) times daily as needed (pain.).     ELIQUIS 5 MG TABS tablet TAKE 1 TABLET (5 MG TOTAL) BY MOUTH 2 (TWO) TIMES DAILY. NEEDS FOLLOW UP APPOINTMENT FORE MORE REFILLS 180 tablet 0   gabapentin (NEURONTIN) 300 MG capsule Take 1 capsule (300 mg total) by mouth at bedtime. 90 capsule 3   lenalidomide (REVLIMID) 5 MG capsule TAKE 1 CAPSULE BY MOUTH 1 TIME A DAY FOR 21 DAYS ON THEN 7 DAYS OFF 21 capsule 0   metoprolol succinate (TOPROL-XL) 25 MG 24 hr tablet TAKE 1 TABLET (25 MG) BY MOUTH IN THE MORNING AND AT BEDTIME 180 tablet 1   metroNIDAZOLE (METROCREAM) 0.75 % cream Apply 1 application topically 2 (two) times daily.     polyethylene glycol powder (GLYCOLAX/MIRALAX) 17 GM/SCOOP powder Take 17-34 g by mouth 2 (two) times daily as needed for moderate constipation. 500 g 5   senna-docusate (SENOKOT-S) 8.6-50 MG tablet Take 2 tablets by mouth at bedtime.     spironolactone (ALDACTONE) 25 MG tablet TAKE 1 TABLET BY MOUTH EVERY DAY 90 tablet 3   Vitamin D, Ergocalciferol, (DRISDOL) 1.25 MG (50000 UNIT) CAPS capsule TAKE 1 CAPSULE BY MOUTH ONE TIME PER WEEK 12 capsule 3   ipratropium (ATROVENT) 0.06 % nasal spray Place 2 sprays into the nose 3 (three)  times daily. 15 mL 2   oxyCODONE (OXY IR/ROXICODONE) 5 MG immediate release tablet Take 1-2 tablets (5-10 mg total) by mouth 2 (two) times daily as needed for severe pain. 120 tablet 0   No facility-administered medications prior to visit.    ROS: Review of Systems  Constitutional:  Positive for fatigue. Negative for appetite change and unexpected weight change.  HENT:  Negative for congestion, nosebleeds, sneezing, sore throat and trouble swallowing.   Eyes:  Negative for itching and visual disturbance.  Respiratory:  Negative for cough.   Cardiovascular:  Negative for chest pain, palpitations and leg swelling.  Gastrointestinal:  Negative for abdominal distention, blood in stool, diarrhea and nausea.  Genitourinary:  Negative for frequency and hematuria.  Musculoskeletal:  Positive for arthralgias, back pain and gait problem. Negative for joint swelling and neck pain.  Skin:  Negative for rash.  Neurological:  Negative for dizziness, tremors, speech difficulty and weakness.  Psychiatric/Behavioral:  Negative for agitation, dysphoric mood and sleep disturbance. The patient is not nervous/anxious.     Objective:  BP 110/60 (BP Location: Left Arm, Patient Position: Sitting, Cuff Size: Normal)   Pulse 60   Temp 98.3 F (36.8 C) (Oral)   Ht 5\' 7"  (1.702 m)   Wt 174 lb (78.9 kg)   SpO2 99%  BMI 27.25 kg/m   BP Readings from Last 3 Encounters:  08/25/23 110/60  07/13/23 126/70  06/08/23 128/80    Wt Readings from Last 3 Encounters:  08/25/23 174 lb (78.9 kg)  07/13/23 173 lb 8 oz (78.7 kg)  06/08/23 170 lb 12.8 oz (77.5 kg)    Physical Exam Constitutional:      General: He is not in acute distress.    Appearance: Normal appearance. He is well-developed.     Comments: NAD  Eyes:     Conjunctiva/sclera: Conjunctivae normal.     Pupils: Pupils are equal, round, and reactive to light.  Neck:     Thyroid: No thyromegaly.     Vascular: No JVD.  Cardiovascular:     Rate  and Rhythm: Normal rate and regular rhythm.     Heart sounds: Normal heart sounds. No murmur heard.    No friction rub. No gallop.  Pulmonary:     Effort: Pulmonary effort is normal. No respiratory distress.     Breath sounds: Normal breath sounds. No wheezing or rales.  Chest:     Chest wall: No tenderness.  Abdominal:     General: Bowel sounds are normal. There is no distension.     Palpations: Abdomen is soft. There is no mass.     Tenderness: There is no abdominal tenderness. There is no guarding or rebound.  Musculoskeletal:        General: Tenderness present. Normal range of motion.     Cervical back: Normal range of motion.     Right lower leg: No edema.     Left lower leg: No edema.  Lymphadenopathy:     Cervical: No cervical adenopathy.  Skin:    General: Skin is warm and dry.     Findings: No rash.  Neurological:     Mental Status: He is alert and oriented to person, place, and time.     Cranial Nerves: No cranial nerve deficit.     Motor: No abnormal muscle tone.     Coordination: Coordination normal.     Gait: Gait abnormal.     Deep Tendon Reflexes: Reflexes are normal and symmetric.  Psychiatric:        Behavior: Behavior normal.        Thought Content: Thought content normal.        Judgment: Judgment normal.   LS, C spine - sensitive w/ROM Antalgic gait  Lab Results  Component Value Date   WBC 4.0 07/13/2023   HGB 14.3 07/13/2023   HCT 43.9 07/13/2023   PLT 99 (L) 07/13/2023   GLUCOSE 96 07/13/2023   CHOL 144 08/01/2018   TRIG 167.0 (H) 08/01/2018   HDL 28.20 (L) 08/01/2018   LDLDIRECT 151.5 11/20/2007   LDLCALC 82 08/01/2018   ALT 23 07/13/2023   AST 21 07/13/2023   NA 139 07/13/2023   K 4.1 07/13/2023   CL 106 07/13/2023   CREATININE 1.14 07/13/2023   BUN 18 07/13/2023   CO2 28 07/13/2023   TSH 5.338 (H) 06/23/2020   PSA 4.20 (H) 08/01/2018   INR 1.5 (H) 11/13/2022   HGBA1C 5.8 (H) 05/04/2019    ECHOCARDIOGRAM COMPLETE Result Date:  07/20/2023    ECHOCARDIOGRAM REPORT   Patient Name:   [facility] Date of Exam: 07/20/2023 Medical Rec #:  [id number]     Height:       67.0 in Accession #:    [id number]    Weight:  173.5 lb Date of Birth:  1941-03-23     BSA:          1.904 m Patient Age:    46 years      BP:           121/70 mmHg Patient Gender: M             HR:           62 bpm. Exam Location:  Outpatient Procedure: 2D Echo, Cardiac Doppler and Color Doppler Indications:    congestive heart failure  History:        Patient has prior history of Echocardiogram examinations, most                 recent 05/31/2022. CAD; Risk Factors:Hypertension and                 Dyslipidemia.                 Aortic Valve: 26 mm Edwards valve is present in the aortic                 position. Procedure Date: 05/08/19.  Sonographer:    Delcie Roch RDCS Referring Phys: [phone] [facility]  Sonographer Comments: Technically difficult study due to poor echo windows. Image acquisition challenging due to respiratory motion. IMPRESSIONS  1. Left ventricular ejection fraction, by estimation, is 65 to 70%. The left ventricle has normal function. The left ventricle has no regional wall motion abnormalities. Left ventricular diastolic parameters are consistent with Grade I diastolic dysfunction (impaired relaxation).  2. Right ventricular systolic function is normal. The right ventricular size is normal.  3. The mitral valve is normal in structure. Mild mitral valve regurgitation. No evidence of mitral stenosis. Moderate mitral annular calcification.  4. The aortic valve has been repaired/replaced. Aortic valve regurgitation is not visualized. No aortic stenosis is present. There is a 26 mm Edwards valve present in the aortic position. Procedure Date: 05/08/19. Echo findings are consistent with normal  structure and function of the aortic valve prosthesis. Aortic valve area, by VTI measures 1.74 cm. Aortic valve mean gradient measures 13.0 mmHg. Aortic valve Vmax  measures 2.37 m/s.  5. The inferior vena cava is normal in size with greater than 50% respiratory variability, suggesting right atrial pressure of 3 mmHg. Comparison(s): Prior images reviewed side by side. 06/10/2022 - TAVR valve mean gradient 19 mmHg. FINDINGS  Left Ventricle: Left ventricular ejection fraction, by estimation, is 65 to 70%. The left ventricle has normal function. The left ventricle has no regional wall motion abnormalities. The left ventricular internal cavity size was normal in size. There is  no left ventricular hypertrophy. Left ventricular diastolic parameters are consistent with Grade I diastolic dysfunction (impaired relaxation). Right Ventricle: The right ventricular size is normal. No increase in right ventricular wall thickness. Right ventricular systolic function is normal. Left Atrium: Left atrial size was normal in size. Right Atrium: Right atrial size was normal in size. Pericardium: There is no evidence of pericardial effusion. Mitral Valve: The mitral valve is normal in structure. Moderate mitral annular calcification. Mild mitral valve regurgitation. No evidence of mitral valve stenosis. Tricuspid Valve: The tricuspid valve is normal in structure. Tricuspid valve regurgitation is not demonstrated. No evidence of tricuspid stenosis. Aortic Valve: The aortic valve has been repaired/replaced. Aortic valve regurgitation is not visualized. No aortic stenosis is present. Aortic valve mean gradient measures 13.0 mmHg. Aortic valve peak gradient measures 22.5 mmHg. Aortic valve  area, by VTI measures 1.74 cm. There is a 26 mm Edwards valve present in the aortic position. Procedure Date: 05/08/19. Echo findings are consistent with normal structure and function of the aortic valve prosthesis. Pulmonic Valve: The pulmonic valve was normal in structure. Pulmonic valve regurgitation is not visualized. No evidence of pulmonic stenosis. Aorta: The aortic root is normal in size and structure. Venous:  The inferior vena cava is normal in size with greater than 50% respiratory variability, suggesting right atrial pressure of 3 mmHg. IAS/Shunts: No atrial level shunt detected by color flow Doppler.  LEFT VENTRICLE PLAX 2D LVIDd:         4.60 cm   Diastology LVIDs:         2.60 cm   LV e' medial:    6.09 cm/s LV PW:         0.90 cm   LV E/e' medial:  12.6 LV IVS:        1.10 cm   LV e' lateral:   6.31 cm/s LVOT diam:     2.00 cm   LV E/e' lateral: 12.2 LV SV:         85 LV SV Index:   45 LVOT Area:     3.14 cm  RIGHT VENTRICLE             IVC RV Basal diam:  2.50 cm     IVC diam: 1.60 cm RV S prime:     15.60 cm/s TAPSE (M-mode): 2.3 cm LEFT ATRIUM             Index        RIGHT ATRIUM           Index LA diam:        3.50 cm 1.84 cm/m   RA Area:     14.10 cm LA Vol (A2C):   42.8 ml 22.48 ml/m  RA Volume:   29.00 ml  15.23 ml/m LA Vol (A4C):   40.5 ml 21.27 ml/m LA Biplane Vol: 42.9 ml 22.54 ml/m  AORTIC VALVE AV Area (Vmax):    1.43 cm AV Area (Vmean):   1.37 cm AV Area (VTI):     1.74 cm AV Vmax:           237.00 cm/s AV Vmean:          169.000 cm/s AV VTI:            0.489 m AV Peak Grad:      22.5 mmHg AV Mean Grad:      13.0 mmHg LVOT Vmax:         108.00 cm/s LVOT Vmean:        73.433 cm/s LVOT VTI:          0.271 m LVOT/AV VTI ratio: 0.55 MITRAL VALVE MV Area (PHT): 2.45 cm    SHUNTS MV Decel Time: 310 msec    Systemic VTI:  0.27 m MV E velocity: 76.70 cm/s  Systemic Diam: 2.00 cm MV A velocity: 74.60 cm/s MV E/A ratio:  1.03 Donato Schultz MD Electronically signed by Donato Schultz MD Signature Date/Time: 07/20/2023/4:47:23 PM    Final     Assessment & Plan:   Problem List Items Addressed This Visit     PAF (paroxysmal atrial fibrillation) (HCC)   S/p cardioversion in 01/2023 On ASA, Eliquis      Relevant Orders   Comprehensive metabolic panel   CBC with Differential/Platelet   TSH   Back pain -  Primary   Chronic Oxycodone prn  Potential benefits of a long term opioids use as well as  potential risks (i.e. addiction risk, apnea etc) and complications (i.e. Somnolence, constipation and others) were explained to the patient and were aknowledged.       Relevant Medications   oxyCODONE (OXY IR/ROXICODONE) 5 MG immediate release tablet   Other Relevant Orders   Comprehensive metabolic panel   CBC with Differential/Platelet   TSH   Multiple myeloma (HCC)   Chronic Oxycodone prn  Potential benefits of a long term opioids use as well as potential risks (i.e. addiction risk, apnea etc) and complications (i.e. Somnolence, constipation and others) were explained to the patient and were aknowledged. Dr. Candise Che       Malignant neoplasm metastatic to bone [facility])   Chronic Oxycodone prn  Potential benefits of a long term opioids use as well as potential risks (i.e. addiction risk, apnea etc) and complications (i.e. Somnolence, constipation and others) were explained to the patient and were aknowledged.      Bloody diarrhea   Doing better         Meds ordered this encounter  Medications   ipratropium (ATROVENT) 0.06 % nasal spray    Sig: Place 2 sprays into the nose 3 (three) times daily.    Dispense:  15 mL    Refill:  2   oxyCODONE (OXY IR/ROXICODONE) 5 MG immediate release tablet    Sig: Take 1-2 tablets (5-10 mg total) by mouth 2 (two) times daily as needed for severe pain (pain score 7-10).    Dispense:  120 tablet    Refill:  0      Follow-up: Return in about 3 months (around 11/23/2023) for a follow-up visit.  Sonda Primes, MD

## 2023-09-06 ENCOUNTER — Other Ambulatory Visit: Payer: Self-pay | Admitting: Hematology

## 2023-09-06 DIAGNOSIS — C9000 Multiple myeloma not having achieved remission: Secondary | ICD-10-CM

## 2023-09-08 ENCOUNTER — Encounter: Payer: Self-pay | Admitting: Hematology

## 2023-10-03 ENCOUNTER — Other Ambulatory Visit: Payer: Self-pay

## 2023-10-03 ENCOUNTER — Other Ambulatory Visit: Payer: Self-pay | Admitting: Hematology

## 2023-10-03 DIAGNOSIS — C9000 Multiple myeloma not having achieved remission: Secondary | ICD-10-CM

## 2023-10-03 MED ORDER — LENALIDOMIDE 5 MG PO CAPS: 5.0000 mg | ORAL_CAPSULE | Freq: Every day | ORAL | 0 refills | Status: DC

## 2023-10-04 ENCOUNTER — Other Ambulatory Visit (HOSPITAL_COMMUNITY): Payer: Self-pay | Admitting: Cardiology

## 2023-10-11 ENCOUNTER — Other Ambulatory Visit: Payer: Self-pay

## 2023-10-11 DIAGNOSIS — C9000 Multiple myeloma not having achieved remission: Secondary | ICD-10-CM

## 2023-10-12 ENCOUNTER — Inpatient Hospital Stay (HOSPITAL_BASED_OUTPATIENT_CLINIC_OR_DEPARTMENT_OTHER): Payer: Medicare Other | Admitting: Hematology

## 2023-10-12 ENCOUNTER — Inpatient Hospital Stay: Payer: Medicare Other

## 2023-10-12 ENCOUNTER — Inpatient Hospital Stay: Payer: Medicare Other | Attending: Hematology

## 2023-10-12 VITALS — BP 127/65 | HR 52 | Temp 97.6°F | Resp 17 | Ht 67.0 in | Wt 174.7 lb

## 2023-10-12 VITALS — BP 120/60 | HR 62 | Resp 16

## 2023-10-12 DIAGNOSIS — I480 Paroxysmal atrial fibrillation: Secondary | ICD-10-CM

## 2023-10-12 DIAGNOSIS — D6959 Other secondary thrombocytopenia: Secondary | ICD-10-CM | POA: Insufficient documentation

## 2023-10-12 DIAGNOSIS — C9000 Multiple myeloma not having achieved remission: Secondary | ICD-10-CM

## 2023-10-12 DIAGNOSIS — I350 Nonrheumatic aortic (valve) stenosis: Secondary | ICD-10-CM | POA: Insufficient documentation

## 2023-10-12 DIAGNOSIS — Q2381 Bicuspid aortic valve: Secondary | ICD-10-CM | POA: Insufficient documentation

## 2023-10-12 DIAGNOSIS — D709 Neutropenia, unspecified: Secondary | ICD-10-CM | POA: Diagnosis not present

## 2023-10-12 DIAGNOSIS — G8929 Other chronic pain: Secondary | ICD-10-CM

## 2023-10-12 DIAGNOSIS — D696 Thrombocytopenia, unspecified: Secondary | ICD-10-CM | POA: Diagnosis not present

## 2023-10-12 DIAGNOSIS — T451X5A Adverse effect of antineoplastic and immunosuppressive drugs, initial encounter: Secondary | ICD-10-CM | POA: Insufficient documentation

## 2023-10-12 DIAGNOSIS — M549 Dorsalgia, unspecified: Secondary | ICD-10-CM | POA: Insufficient documentation

## 2023-10-12 DIAGNOSIS — G629 Polyneuropathy, unspecified: Secondary | ICD-10-CM | POA: Insufficient documentation

## 2023-10-12 DIAGNOSIS — Z800 Family history of malignant neoplasm of digestive organs: Secondary | ICD-10-CM | POA: Diagnosis not present

## 2023-10-12 DIAGNOSIS — M5416 Radiculopathy, lumbar region: Secondary | ICD-10-CM | POA: Insufficient documentation

## 2023-10-12 DIAGNOSIS — Z952 Presence of prosthetic heart valve: Secondary | ICD-10-CM | POA: Diagnosis not present

## 2023-10-12 DIAGNOSIS — G620 Drug-induced polyneuropathy: Secondary | ICD-10-CM | POA: Insufficient documentation

## 2023-10-12 DIAGNOSIS — C9002 Multiple myeloma in relapse: Secondary | ICD-10-CM | POA: Insufficient documentation

## 2023-10-12 DIAGNOSIS — D708 Other neutropenia: Secondary | ICD-10-CM | POA: Insufficient documentation

## 2023-10-12 DIAGNOSIS — C7951 Secondary malignant neoplasm of bone: Secondary | ICD-10-CM

## 2023-10-12 DIAGNOSIS — Z7189 Other specified counseling: Secondary | ICD-10-CM

## 2023-10-12 DIAGNOSIS — Z79899 Other long term (current) drug therapy: Secondary | ICD-10-CM | POA: Insufficient documentation

## 2023-10-12 LAB — CBC WITH DIFFERENTIAL (CANCER CENTER ONLY)
Abs Immature Granulocytes: 0.01 10*3/uL (ref 0.00–0.07)
Basophils Absolute: 0 10*3/uL (ref 0.0–0.1)
Basophils Relative: 1 %
Eosinophils Absolute: 0.2 10*3/uL (ref 0.0–0.5)
Eosinophils Relative: 7 %
HCT: 43.3 % (ref 39.0–52.0)
Hemoglobin: 14.2 g/dL (ref 13.0–17.0)
Immature Granulocytes: 0 %
Lymphocytes Relative: 27 %
Lymphs Abs: 0.8 10*3/uL (ref 0.7–4.0)
MCH: 31.2 pg (ref 26.0–34.0)
MCHC: 32.8 g/dL (ref 30.0–36.0)
MCV: 95.2 fL (ref 80.0–100.0)
Monocytes Absolute: 0.5 10*3/uL (ref 0.1–1.0)
Monocytes Relative: 16 %
Neutro Abs: 1.4 10*3/uL — ABNORMAL LOW (ref 1.7–7.7)
Neutrophils Relative %: 49 %
Platelet Count: 98 10*3/uL — ABNORMAL LOW (ref 150–400)
RBC: 4.55 MIL/uL (ref 4.22–5.81)
RDW: 13.3 % (ref 11.5–15.5)
WBC Count: 2.9 10*3/uL — ABNORMAL LOW (ref 4.0–10.5)
nRBC: 0 % (ref 0.0–0.2)

## 2023-10-12 LAB — CMP (CANCER CENTER ONLY)
ALT: 27 U/L (ref 0–44)
AST: 24 U/L (ref 15–41)
Albumin: 4.1 g/dL (ref 3.5–5.0)
Alkaline Phosphatase: 64 U/L (ref 38–126)
Anion gap: 6 (ref 5–15)
BUN: 19 mg/dL (ref 8–23)
CO2: 27 mmol/L (ref 22–32)
Calcium: 9.1 mg/dL (ref 8.9–10.3)
Chloride: 107 mmol/L (ref 98–111)
Creatinine: 1.03 mg/dL (ref 0.61–1.24)
GFR, Estimated: >60 mL/min (ref >60)
Glucose, Bld: 99 mg/dL (ref 70–99)
Potassium: 4 mmol/L (ref 3.5–5.1)
Sodium: 140 mmol/L (ref 135–145)
Total Bilirubin: 0.6 mg/dL (ref 0.0–1.2)
Total Protein: 6.4 g/dL — ABNORMAL LOW (ref 6.5–8.1)

## 2023-10-12 MED ORDER — ZOLEDRONIC ACID 4 MG/100ML IV SOLN
4.0000 mg | Freq: Once | INTRAVENOUS | Status: AC
  Administered 2023-10-12: 4 mg via INTRAVENOUS
  Filled 2023-10-12: qty 100

## 2023-10-12 MED ORDER — SODIUM CHLORIDE 0.9 % IV SOLN
INTRAVENOUS | Status: DC
Start: 2023-10-12 — End: 2023-10-12

## 2023-10-12 NOTE — Patient Instructions (Signed)

## 2023-10-12 NOTE — Progress Notes (Signed)
Patient seen by Dr. Addison Naegeli are within treatment parameters.  Labs reviewed: and are not all within treatment parameters.    Dr Candise Che aware ANC: 1.4,   Per physician team, patient is ready for treatment and there are NO modifications to the treatment plan.

## 2023-10-12 NOTE — Progress Notes (Signed)
HEMATOLOGY/ONCOLOGY CLINIC NOTE  Date of Service: 10/12/23    Patient Care Team: Tresa Garter, MD as PCP - General Laurey Morale, MD as PCP - Advanced Heart Failure (Cardiology) Laurey Morale, MD as PCP - Cardiology (Cardiology) Maeola Harman, MD as Attending Physician (Neurosurgery) Donzetta Starch, MD as Consulting Physician (Dermatology) Oretha Milch, MD as Consulting Physician (Pulmonary Disease) Glendale Chard, DO as Consulting Physician (Neurology)   CHIEF COMPLAINTS/PURPOSE OF CONSULTATION:  Follow-up for continued evaluation and management of multiple myeloma  HISTORY OF PRESENTING ILLNESS:  Please see previous notes for details on initial presentation  INTERVAL HISTORY:  [name] [name] Is a 83 y.o. male is here for continued evaluation and management of multiple myeloma.  Patient was last seen by me on 07/13/2023 and complained of mild leg weakness related to neuropathy, hand cramping sometimes, stable back pain with possible arthritis symptoms, and stable urinary frequency.   Today, he reports that the recent cold weather has been fairly bothersome. Patient complains of neuropathic pain and describes that his legs give up early and feels like his feet are "drilled to the bone". His pain is present in his entire foot and does radiate around the foot. He does note some swelling in his feet. Patient reports that the pain does cause him to awaken due to a sudden jolting pain causing him to jump from sleeping.   He uses voltaren gel which improves his pain. Hot showers do improve his pain. He reports that he only takes gabapentin once in a while. He reports that he takes Oxycodone once a day to manage his pain. He notes that he has tried multiple interventions, including acupuncture, which did not improve neuropathy pain.   Patient has been tolerating Revlimid well with no toxicity issues. He denies any concern for diarrhea.   He reports some back pain and  generally denies any other new bone pain. He denies any back pain on palpation. He denies any infection issues or abdominal pain.   He takes Aspirin regularly. Patient also takes vitamin B complex and Vitamin D supplementation regularly.    MEDICAL HISTORY:  Past Medical History:  Diagnosis Date   Ascending aortic aneurysm (HCC)    Bicuspid aortic valve    Cancer (HCC)    CHF NYHA class I (no symptoms from ordinary activities), acute, diastolic (HCC)    Dysrhythmia 4098   A fib   Fatty liver    mild   Fracture    left proximal humerus   GERD (gastroesophageal reflux disease)    GI bleeding 07/21/2018   post polypectomy   Hemorrhoids    HTN (hypertension)    Hypercholesteremia    Hypokalemia    Internal hemorrhoids    LBP (low back pain)    Moderate aortic stenosis    Osteoarthritis    Paroxysmal atrial fibrillation (HCC)    a. new onset Afib in 07/2008. He underwent ibutilide cardioversion successfully. b. Recurrence 01/2013 s/p TEE/DCCV - was on Xarelto but he stopped it as he was convinced it was causing joint pn. c. Recurrence 01/2016 - spont conv to NSR. Pt took Eliquis x1 mo then declined further anticoag. d. Recurrence 07/2016.   Pneumonia    Tubular adenoma of colon     SURGICAL HISTORY: Past Surgical History:  Procedure Laterality Date   BACK SURGERY  x12 years ago   CARDIAC CATHETERIZATION  2020   CARDIAC VALVE REPLACEMENT  2020   CARDIOVERSION N/A 01/26/2013  Procedure: CARDIOVERSION;  Surgeon: Laurey Morale, MD;  Location: [facility] ENDOSCOPY;  Service: Cardiovascular;  Laterality: N/A;   CARDIOVERSION N/A 10/28/2017   Procedure: CARDIOVERSION;  Surgeon: Laurey Morale, MD;  Location: [facility] ENDOSCOPY;  Service: Cardiovascular;  Laterality: N/A;   CARDIOVERSION N/A 03/03/2018   Procedure: CARDIOVERSION;  Surgeon: Lewayne Bunting, MD;  Location: [facility] ENDOSCOPY;  Service: Cardiovascular;  Laterality: N/A;   CARDIOVERSION N/A 09/19/2019   Procedure: CARDIOVERSION;  Surgeon:  Laurey Morale, MD;  Location: [facility] ENDOSCOPY;  Service: Cardiovascular;  Laterality: N/A;   COLONOSCOPY     COLONOSCOPY  07/17/2018   at [facility]   HEMORRHOID SURGERY     INGUINAL HERNIA REPAIR Left 03/18/2020   Procedure: OPEN LEFT INGUINAL HERNIA REPAIR;  Surgeon: Ovidio Kin, MD;  Location: [facility] OR;  Service: General;  Laterality: Left;   LUMBAR LAMINECTOMY     ORIF HUMERUS FRACTURE Left 01/15/2020   Procedure: OPEN REDUCTION INTERNAL FIXATION (ORIF) PROXIMAL HUMERUS FRACTURE;  Surgeon: Yolonda Kida, MD;  Location: [facility] OR;  Service: Orthopedics;  Laterality: Left;   POLYPECTOMY     RIGHT HEART CATH N/A 08/20/2019   Procedure: RIGHT HEART CATH;  Surgeon: Laurey Morale, MD;  Location: [facility] INVASIVE CV LAB;  Service: Cardiovascular;  Laterality: N/A;   RIGHT/LEFT HEART CATH AND CORONARY ANGIOGRAPHY N/A 03/07/2019   Procedure: RIGHT/LEFT HEART CATH AND CORONARY ANGIOGRAPHY;  Surgeon: Kathleene Hazel, MD;  Location: [facility] INVASIVE CV LAB;  Service: Cardiovascular;  Laterality: N/A;   TAVAR  04/2019   TEE WITHOUT CARDIOVERSION N/A 01/26/2013   Procedure: TRANSESOPHAGEAL ECHOCARDIOGRAM (TEE);  Surgeon: Laurey Morale, MD;  Location: [facility] ENDOSCOPY;  Service: Cardiovascular;  Laterality: N/A;   TEE WITHOUT CARDIOVERSION N/A 10/28/2017   Procedure: TRANSESOPHAGEAL ECHOCARDIOGRAM (TEE);  Surgeon: Laurey Morale, MD;  Location: [facility] ENDOSCOPY;  Service: Cardiovascular;  Laterality: N/A;   TEE WITHOUT CARDIOVERSION N/A 05/08/2019   Procedure: TRANSESOPHAGEAL ECHOCARDIOGRAM (TEE);  Surgeon: Kathleene Hazel, MD;  Location: [facility] INVASIVE CV LAB;  Service: Open Heart Surgery;  Laterality: N/A;   TEE WITHOUT CARDIOVERSION N/A 09/19/2019   Procedure: TRANSESOPHAGEAL ECHOCARDIOGRAM (TEE);  Surgeon: Laurey Morale, MD;  Location: [facility] ENDOSCOPY;  Service: Cardiovascular;  Laterality: N/A;   TRANSCATHETER AORTIC VALVE REPLACEMENT, TRANSFEMORAL N/A 05/08/2019   Procedure: TRANSCATHETER AORTIC VALVE  REPLACEMENT, TRANSFEMORAL;  Surgeon: Kathleene Hazel, MD;  Location: [facility] INVASIVE CV LAB;  Service: Open Heart Surgery;  Laterality: N/A;    SOCIAL HISTORY: Social History   Socioeconomic History   Marital status: Married    Spouse name: Not on file   Number of children: 0   Years of education: Not on file   Highest education level: Not on file  Occupational History   Occupation: Retired Garment/textile technologist: [employer]  Tobacco Use   Smoking status: Never   Smokeless tobacco: Never  Vaping Use   Vaping status: Never Used  Substance and Sexual Activity   Alcohol use: Not Currently   Drug use: No   Sexual activity: Yes  Other Topics Concern   Not on file  Social History Narrative   Patient lives in [place] w/ his wife. He is a native of [place]. 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   Right-handed   Caffeine: 2 cups coffee per day   Two story home   Social Drivers of Health   Financial Resource Strain: Not on file  Food Insecurity:  No Food Insecurity (02/15/2023)   Hunger Vital Sign    Worried About Running Out of Food in the Last Year: Never true    Ran Out of Food in the Last Year: Never true  Transportation Needs: No Transportation Needs (02/15/2023)   PRAPARE - Administrator, Civil Service (Medical): No    Lack of Transportation (Non-Medical): No  Physical Activity: Not on file  Stress: Not on file  Social Connections: Not on file  Intimate Partner Violence: Not on file    FAMILY HISTORY: Family History  Problem Relation Age of Onset   Colon cancer Mother 25   Hypertension Other    Coronary artery disease Neg Hx    Colon polyps Neg Hx    Esophageal cancer Neg Hx    Rectal cancer Neg Hx    Stomach cancer Neg Hx     ALLERGIES:  is allergic to xarelto [rivaroxaban], corticosteroids, ramipril, zolpidem, and benazepril.  MEDICATIONS:  Current Outpatient Medications  Medication Sig  Dispense Refill   aspirin 81 MG chewable tablet CHEW 1 TABLET BY MOUTH DAILY. 90 tablet 1   b complex vitamins tablet Take 1 tablet by mouth daily. 100 tablet 3   Calcium-Magnesium 500-250 MG TABS Take 1 tablet by mouth daily. 30 tablet 6   Carboxymethylcellul-Glycerin (LUBRICATING EYE DROPS OP) Place 1 drop into both eyes daily as needed (dry eyes).     Cholecalciferol (VITAMIN D) 50 MCG (2000 UT) tablet Take 2,000 Units by mouth daily.     diclofenac Sodium (VOLTAREN) 1 % GEL Apply 1 application topically 2 (two) times daily as needed (pain.).     ELIQUIS 5 MG TABS tablet TAKE 1 TABLET (5 MG TOTAL) BY MOUTH 2 (TWO) TIMES DAILY. NEEDS FOLLOW UP APPOINTMENT FORE MORE REFILLS 180 tablet 0   gabapentin (NEURONTIN) 300 MG capsule Take 1 capsule (300 mg total) by mouth at bedtime. 90 capsule 3   ipratropium (ATROVENT) 0.06 % nasal spray Place 2 sprays into the nose 3 (three) times daily. 15 mL 2   lenalidomide (REVLIMID) 5 MG capsule TAKE 1 CAPSULE BY MOUTH 1 TIME A DAY FOR 21 DAYS ON THEN 7 DAYS OFF 21 capsule 0   metoprolol succinate (TOPROL-XL) 25 MG 24 hr tablet TAKE 1 TABLET (25 MG) BY MOUTH IN THE MORNING AND AT BEDTIME 180 tablet 1   metroNIDAZOLE (METROCREAM) 0.75 % cream Apply 1 application topically 2 (two) times daily.     oxyCODONE (OXY IR/ROXICODONE) 5 MG immediate release tablet Take 1-2 tablets (5-10 mg total) by mouth 2 (two) times daily as needed for severe pain (pain score 7-10). 120 tablet 0   polyethylene glycol powder (GLYCOLAX/MIRALAX) 17 GM/SCOOP powder Take 17-34 g by mouth 2 (two) times daily as needed for moderate constipation. 500 g 5   senna-docusate (SENOKOT-S) 8.6-50 MG tablet Take 2 tablets by mouth at bedtime.     spironolactone (ALDACTONE) 25 MG tablet TAKE 1 TABLET BY MOUTH EVERY DAY 90 tablet 3   Vitamin D, Ergocalciferol, (DRISDOL) 1.25 MG (50000 UNIT) CAPS capsule TAKE 1 CAPSULE BY MOUTH ONE TIME PER WEEK 12 capsule 3   No current facility-administered medications  for this visit.    REVIEW OF SYSTEMS:    10 Point review of Systems was done is negative except as noted above.   PHYSICAL EXAMINATION: Vitals:   10/12/23 1243  BP: 127/65  Pulse: (!) 52  Resp: 17  Temp: 97.6 F (36.4 C)  SpO2: 100%  Wt Readings from Last 3 Encounters:  08/25/23 174 lb (78.9 kg)  07/13/23 173 lb 8 oz (78.7 kg)  06/08/23 170 lb 12.8 oz (77.5 kg)   Body mass index is 27.36 kg/m.    ECOG FS:2 - Symptomatic, <50% confined to bed   GENERAL:alert, in no acute distress and comfortable SKIN: no acute rashes, no significant lesions EYES: conjunctiva are pink and non-injected, sclera anicteric OROPHARYNX: MMM, no exudates, no oropharyngeal erythema or ulceration NECK: supple, no JVD LYMPH:  no palpable lymphadenopathy in the cervical, axillary or inguinal regions LUNGS: clear to auscultation b/l with normal respiratory effort HEART: regular rate & rhythm ABDOMEN:  normoactive bowel sounds , non tender, not distended. Extremity: no pedal edema PSYCH: alert & oriented x 3 with fluent speech NEURO: no focal motor/sensory deficits   LABORATORY DATA:  I have reviewed the data as listed  .    Latest Ref Rng & Units 10/12/2023   11:55 AM 07/13/2023    1:52 PM 06/08/2023   11:14 AM  CBC  WBC 4.0 - 10.5 K/uL 2.9  4.0  3.0   Hemoglobin 13.0 - 17.0 g/dL 13.0  86.5  78.4   Hematocrit 39.0 - 52.0 % 43.3  43.9  46.2   Platelets 150 - 400 K/uL 98  99  116     .    Latest Ref Rng & Units 10/12/2023   11:55 AM 07/13/2023    1:52 PM 06/08/2023   11:14 AM  CMP  Glucose 70 - 99 mg/dL 99  96  95   BUN 8 - 23 mg/dL 19  18  19    Creatinine 0.61 - 1.24 mg/dL 6.96  2.95  2.84   Sodium 135 - 145 mmol/L 140  139  139   Potassium 3.5 - 5.1 mmol/L 4.0  4.1  4.2   Chloride 98 - 111 mmol/L 107  106  105   CO2 22 - 32 mmol/L 27  28  27    Calcium 8.9 - 10.3 mg/dL 9.1  9.1  9.1   Total Protein 6.5 - 8.1 g/dL 6.4  6.4    Total Bilirubin 0.0 - 1.2 mg/dL 0.6  0.5     Alkaline Phos 38 - 126 U/L 64  57    AST 15 - 41 U/L 24  21    ALT 0 - 44 U/L 27  23           05/24/2019 Bone Marrow Biopsy    04/16/2019 Surgical Pathology:   Surgical Pathology  CASE: [id number]  PATIENT[facility]  Bone Marrow Report      Clinical History: Multiple myeloma, remission status unspecified (HCC)  (BH)      DIAGNOSIS:   BONE MARROW, ASPIRATE, CLOT, CORE:  -Variably cellular bone marrow with trilineage hematopoiesis and 2%  plasma cells  -See comment   PERIPHERAL BLOOD:  -Slight thrombocytopenia   COMMENT:   The bone marrow is variably cellular with trilineage hematopoiesis  including abundant megakaryocytes in the more cellular areas.  In this  background, the plasma cells represent 2% of all cells in the aspirate  with interstitial cells and a few very minute clusters in the  clot/biopsy sections.  The latter in particular display kappa light  chain excess/restriction most suggestive of minimal residual plasma cell  neoplasm despite limited findings.  Correlation with cytogenetic and  FISH studies is recommended.    RADIOGRAPHIC STUDIES: I have personally reviewed the radiological images as listed and agreed with the  findings in the report. No results found.  ASSESSMENT & PLAN:   83 y.o. male with:  #1 Plasma cell myeloma-currently in relapse  03/28/2019 MRI pelvis w/wo contrast revealed "1. Destructive bone lesions as detailed above. Findings most consistent with metastatic disease. PET-CT may be helpful for further evaluation and to establish a primary tumor. The right pelvic bone lesions should be amenable to image guided biopsy but a PET scan may demonstrate easier/safer biopsy sites. 2. No intrapelvic mass or adenopathy. 3. Benign intraosseous lipoma involving the left anterior superior acetabulum."  04/06/2019 PET whole body revealed "1. Diffuse osseous metastatic disease as detailed above without findings for a primary  neoplasm in the chest, abdomen or pelvis. The large destructive lesion involving the right ischium should be amenable to image guided biopsy. 2. Two small retroperitoneal lymph nodes and 1 small right obturator node showing hypermetabolism."   04/16/2019 Posterior right pelvis bone biopsy revealed "PLASMA CELL NEOPLASM"  05/24/2019 Bone Marrow Biopsy revealed "BONE MARROW: - CELLULAR MARROW WITH INVOLVEMENT BY PLASMA CELL NEOPLASM (20%) PERIPHERAL BLOOD: - MORPHOLOGICALLY UNREMARKABLE"  05/24/2019 FISH Panel revealed "ABNORMAL result with 11q+, 14q+ and +17"  #2 Severe aortic stenosis with bicuspid aortic valve -10/26/2018 ECHO revealed AVA at 0.8 cm2 and LV EF of 60-65% -05/08/2019 pt had a Transfemoral Transcatheter Aortic Valve Replacement  PET CT scan from 2/2 which shows There is persistent low level radiotracer uptake associated with the large lucent lesion within the superior right acetabulum. Additionally, there is a new focal area of increased radiotracer uptake above the background low level activity within this lesion with SUV max of 3.92. On the previous exam there was relatively homogeneous low level uptake within this lesion within SUV max of 2.43. Imaging findings are concerning for residual metabolically active tumor. Patient has seen radiation oncology since his last visit and they have decided to proceed with involved site radiation therapy to the FDG avid lesion to reduce the risk of fracture.  -He had his bone marrow biopsy on 11/03/2021 which shows 2% abnormal plasma cells.  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 and also radiculopathy due to chronic back issues  PLAN:  -Discussed lab results on 10/12/23  in detail with patient. CBC stable, showed WBC of 2.9K, hemoglobin of 14.2, and platelets of 98K. -hgb normal -WBC and platelets slightly low from Revlimid. Not significantly concerning -Mild neutropenia and thrombocytopenia likely from Revlimid -CMP normal; no  kidney function changes or increased calcium -myeloma labs from today are pending at this time -his M protein has fluctuated between being undetectable and 0.2 g/dL  -myeloma panel from three months ago showed M spike of 0.2g/dl and today is the same. -Patient has no new symptoms suggestive of myeloma progression at this time.  -continue maintenance Revlimid 5 mg p.o. daily 3 weeks on 1 week off.  -his feet pain likely due to a combination of neuropathy from treatment as well as pinched nerves from the back -recommend continuing 300 MG gabapentin at bed time to improve his nerve pain  -educated patient that his low-dose gabapentin generally does not cause side affects -continue Oxycodone as needed for pain management -continue vitamin B complex and vitamin D supplementation -will continue to monitor with labs in 3 months  FOLLOW UP: RTC with Dr Candise Che with labs and next dose of Zometa in 3 months   The total time spent in the appointment was 30 minutes* .  All of the patient's questions were answered with apparent satisfaction. The patient  knows to call the clinic with any problems, questions or concerns.   Wyvonnia Lora MD MS AAHIVMS [facility] Hematology/Oncology Physician [facility]  .*Total Encounter Time as defined by the Centers for Medicare and Medicaid Services includes, in addition to the face-to-face time of a patient visit (documented in the note above) non-face-to-face time: obtaining and reviewing outside history, ordering and reviewing medications, tests or procedures, care coordination (communications with other health care professionals or caregivers) and documentation in the medical record.    I,Mitra Faeizi,acting as a Neurosurgeon for Wyvonnia Lora, MD.,have documented all relevant documentation on the behalf of Wyvonnia Lora, MD,as directed by  Wyvonnia Lora, MD while in the presence of Wyvonnia Lora, MD.  .I have reviewed the above documentation for accuracy and  completeness, and I agree with the above. Johney Maine MD

## 2023-10-13 LAB — KAPPA/LAMBDA LIGHT CHAINS
Kappa free light chain: 104.5 mg/L — ABNORMAL HIGH (ref 3.3–19.4)
Kappa, lambda light chain ratio: 9.25 — ABNORMAL HIGH (ref 0.26–1.65)
Lambda free light chains: 11.3 mg/L (ref 5.7–26.3)

## 2023-10-14 ENCOUNTER — Other Ambulatory Visit (HOSPITAL_COMMUNITY): Payer: Self-pay | Admitting: Family Medicine

## 2023-10-17 LAB — MULTIPLE MYELOMA PANEL, SERUM
Albumin SerPl Elph-Mcnc: 3.7 g/dL (ref 2.9–4.4)
Albumin/Glob SerPl: 1.7 (ref 0.7–1.7)
Alpha 1: 0.2 g/dL (ref 0.0–0.4)
Alpha2 Glob SerPl Elph-Mcnc: 0.6 g/dL (ref 0.4–1.0)
B-Globulin SerPl Elph-Mcnc: 0.8 g/dL (ref 0.7–1.3)
Gamma Glob SerPl Elph-Mcnc: 0.7 g/dL (ref 0.4–1.8)
Globulin, Total: 2.3 g/dL (ref 2.2–3.9)
IgA: 116 mg/dL (ref 61–437)
IgG (Immunoglobin G), Serum: 691 mg/dL (ref 603–1613)
IgM (Immunoglobulin M), Srm: 36 mg/dL (ref 15–143)
M Protein SerPl Elph-Mcnc: 0.2 g/dL — ABNORMAL HIGH
Total Protein ELP: 6 g/dL (ref 6.0–8.5)

## 2023-10-18 ENCOUNTER — Encounter: Payer: Self-pay | Admitting: Hematology

## 2023-11-01 ENCOUNTER — Other Ambulatory Visit: Payer: Self-pay | Admitting: Hematology

## 2023-11-01 DIAGNOSIS — C9000 Multiple myeloma not having achieved remission: Secondary | ICD-10-CM

## 2023-11-04 ENCOUNTER — Telehealth: Payer: Self-pay | Admitting: Hematology

## 2023-11-04 NOTE — Telephone Encounter (Signed)
 Left patient a vm regarding upcoming appointment

## 2023-11-14 DIAGNOSIS — H33102 Unspecified retinoschisis, left eye: Secondary | ICD-10-CM | POA: Diagnosis not present

## 2023-11-14 DIAGNOSIS — H524 Presbyopia: Secondary | ICD-10-CM | POA: Diagnosis not present

## 2023-11-14 DIAGNOSIS — H2513 Age-related nuclear cataract, bilateral: Secondary | ICD-10-CM | POA: Diagnosis not present

## 2023-11-18 ENCOUNTER — Other Ambulatory Visit (HOSPITAL_COMMUNITY): Payer: Self-pay | Admitting: Cardiology

## 2023-11-24 ENCOUNTER — Ambulatory Visit: Payer: Medicare Other | Admitting: Internal Medicine

## 2023-11-24 ENCOUNTER — Encounter: Payer: Self-pay | Admitting: Internal Medicine

## 2023-11-24 VITALS — BP 128/72 | HR 57 | Temp 98.1°F | Ht 67.0 in | Wt 170.0 lb

## 2023-11-24 DIAGNOSIS — I2510 Atherosclerotic heart disease of native coronary artery without angina pectoris: Secondary | ICD-10-CM

## 2023-11-24 DIAGNOSIS — C7951 Secondary malignant neoplasm of bone: Secondary | ICD-10-CM | POA: Diagnosis not present

## 2023-11-24 DIAGNOSIS — M546 Pain in thoracic spine: Secondary | ICD-10-CM | POA: Diagnosis not present

## 2023-11-24 DIAGNOSIS — R269 Unspecified abnormalities of gait and mobility: Secondary | ICD-10-CM | POA: Diagnosis not present

## 2023-11-24 DIAGNOSIS — I480 Paroxysmal atrial fibrillation: Secondary | ICD-10-CM | POA: Diagnosis not present

## 2023-11-24 DIAGNOSIS — I10 Essential (primary) hypertension: Secondary | ICD-10-CM | POA: Diagnosis not present

## 2023-11-24 MED ORDER — OXYCODONE HCL 5 MG PO TABS: 5.0000 mg | ORAL_TABLET | Freq: Two times a day (BID) | ORAL | 0 refills | Status: DC | PRN

## 2023-11-24 NOTE — Assessment & Plan Note (Signed)
Using a cane 

## 2023-11-24 NOTE — Assessment & Plan Note (Signed)
MM - f/u w/Dr Irene Limbo. Oxycodone prn for chronic pain. Oxycontin - not covered.  Potential benefits of a long term opioids use as well as potential risks (i.e. addiction risk, apnea etc) and complications (i.e. Somnolence, constipation and others) were explained to the patient and were aknowledged.

## 2023-11-24 NOTE — Assessment & Plan Note (Signed)
On  ASA, Eliquis

## 2023-11-24 NOTE — Progress Notes (Signed)
 Subjective:  Patient ID: [name] [name], male    DOB: [birth year]  Age: 83 y.o. MRN: [id number]  CC: Follow-up (3 month f/u. Patient request refill for Oxycodone )   HPI Arye Markuson presents for MM, chronic pain, A fib  Outpatient Medications Prior to Visit  Medication Sig Dispense Refill   aspirin 81 MG chewable tablet CHEW 1 TABLET BY MOUTH DAILY. 90 tablet 1   b complex vitamins tablet Take 1 tablet by mouth daily. 100 tablet 3   Calcium-Magnesium 500-250 MG TABS Take 1 tablet by mouth daily. 30 tablet 6   Carboxymethylcellul-Glycerin (LUBRICATING EYE DROPS OP) Place 1 drop into both eyes daily as needed (dry eyes).     Cholecalciferol (VITAMIN D) 50 MCG (2000 UT) tablet Take 2,000 Units by mouth daily.     diclofenac Sodium (VOLTAREN) 1 % GEL Apply 1 application topically 2 (two) times daily as needed (pain.).     ELIQUIS 5 MG TABS tablet TAKE 1 TABLET (5 MG TOTAL) BY MOUTH 2 (TWO) TIMES DAILY. NEEDS FOLLOW UP APPOINTMENT FORE MORE REFILLS 180 tablet 0   gabapentin (NEURONTIN) 300 MG capsule Take 1 capsule (300 mg total) by mouth at bedtime. 90 capsule 3   ipratropium (ATROVENT) 0.06 % nasal spray Place 2 sprays into the nose 3 (three) times daily. 15 mL 2   lenalidomide (REVLIMID) 5 MG capsule TAKE 1 CAPSULE BY MOUTH 1 TIME A DAY FOR 21 DAYS ON THEN 7 DAYS OFF 21 capsule 0   metoprolol succinate (TOPROL-XL) 25 MG 24 hr tablet TAKE 1 TABLET (25 MG TOTAL) BY MOUTH AT BEDTIME 90 tablet 3   metroNIDAZOLE (METROCREAM) 0.75 % cream Apply 1 application topically 2 (two) times daily.     polyethylene glycol powder (GLYCOLAX/MIRALAX) 17 GM/SCOOP powder Take 17-34 g by mouth 2 (two) times daily as needed for moderate constipation. 500 g 5   senna-docusate (SENOKOT-S) 8.6-50 MG tablet Take 2 tablets by mouth at bedtime.     spironolactone (ALDACTONE) 25 MG tablet TAKE 1 TABLET BY MOUTH EVERY DAY 90 tablet 3   Vitamin D, Ergocalciferol, (DRISDOL) 1.25 MG (50000 UNIT) CAPS capsule TAKE 1 CAPSULE  BY MOUTH ONE TIME PER WEEK 12 capsule 3   oxyCODONE (OXY IR/ROXICODONE) 5 MG immediate release tablet Take 1-2 tablets (5-10 mg total) by mouth 2 (two) times daily as needed for severe pain (pain score 7-10). 120 tablet 0   No facility-administered medications prior to visit.    ROS: Review of Systems  Constitutional:  Negative for appetite change, fatigue and unexpected weight change.  HENT:  Negative for congestion, nosebleeds, sneezing, sore throat and trouble swallowing.   Eyes:  Negative for itching and visual disturbance.  Respiratory:  Negative for cough.   Cardiovascular:  Negative for chest pain, palpitations and leg swelling.  Gastrointestinal:  Negative for abdominal distention, blood in stool, diarrhea and nausea.  Genitourinary:  Negative for frequency and hematuria.  Musculoskeletal:  Positive for arthralgias, back pain, gait problem, neck pain and neck stiffness. Negative for joint swelling.  Skin:  Negative for rash.  Neurological:  Negative for dizziness, tremors, speech difficulty and weakness.  Psychiatric/Behavioral:  Positive for sleep disturbance. Negative for agitation, dysphoric mood and suicidal ideas. The patient is not nervous/anxious.     Objective:  BP 128/72 (BP Location: Left Arm, Patient Position: Sitting, Cuff Size: Normal)   Pulse (!) 57   Temp 98.1 F (36.7 C) (Oral)   Ht 5\' 7"  (1.702 m)   Wt 170  lb (77.1 kg)   SpO2 97%   BMI 26.63 kg/m   BP Readings from Last 3 Encounters:  11/24/23 128/72  10/12/23 120/60  10/12/23 127/65    Wt Readings from Last 3 Encounters:  11/24/23 170 lb (77.1 kg)  10/12/23 174 lb 11.2 oz (79.2 kg)  08/25/23 174 lb (78.9 kg)    Physical Exam Constitutional:      General: He is not in acute distress.    Appearance: Normal appearance. He is well-developed.     Comments: NAD  Eyes:     Conjunctiva/sclera: Conjunctivae normal.     Pupils: Pupils are equal, round, and reactive to light.  Neck:     Thyroid: No  thyromegaly.     Vascular: No JVD.  Cardiovascular:     Rate and Rhythm: Normal rate and regular rhythm.     Heart sounds: Normal heart sounds. No murmur heard.    No friction rub. No gallop.  Pulmonary:     Effort: Pulmonary effort is normal. No respiratory distress.     Breath sounds: Normal breath sounds. No wheezing or rales.  Chest:     Chest wall: No tenderness.  Abdominal:     General: Bowel sounds are normal. There is no distension.     Palpations: Abdomen is soft. There is no mass.     Tenderness: There is no abdominal tenderness. There is no guarding or rebound.  Musculoskeletal:        General: Tenderness present. Normal range of motion.     Cervical back: Normal range of motion.     Right lower leg: No edema.     Left lower leg: No edema.  Lymphadenopathy:     Cervical: No cervical adenopathy.  Skin:    General: Skin is warm and dry.     Findings: No rash.  Neurological:     Mental Status: He is alert and oriented to person, place, and time.     Cranial Nerves: No cranial nerve deficit.     Motor: No abnormal muscle tone.     Coordination: Coordination normal.     Gait: Gait abnormal.     Deep Tendon Reflexes: Reflexes are normal and symmetric.  Psychiatric:        Behavior: Behavior normal.        Thought Content: Thought content normal.        Judgment: Judgment normal.   Feet, LS w/pain Using a cane  Lab Results  Component Value Date   WBC 2.9 (L) 10/12/2023   HGB 14.2 10/12/2023   HCT 43.3 10/12/2023   PLT 98 (L) 10/12/2023   GLUCOSE 99 10/12/2023   CHOL 144 08/01/2018   TRIG 167.0 (H) 08/01/2018   HDL 28.20 (L) 08/01/2018   LDLDIRECT 151.5 11/20/2007   LDLCALC 82 08/01/2018   ALT 27 10/12/2023   AST 24 10/12/2023   NA 140 10/12/2023   K 4.0 10/12/2023   CL 107 10/12/2023   CREATININE 1.03 10/12/2023   BUN 19 10/12/2023   CO2 27 10/12/2023   TSH 5.338 (H) 06/23/2020   PSA 4.20 (H) 08/01/2018   INR 1.5 (H) 11/13/2022   HGBA1C 5.8 (H)  05/04/2019    ECHOCARDIOGRAM COMPLETE Result Date: 07/20/2023    ECHOCARDIOGRAM REPORT   Patient Name:   [facility] Date of Exam: 07/20/2023 Medical Rec #:  [id number]     Height:       67.0 in Accession #:    [id number]    Weight:  173.5 lb Date of Birth:  07/17/1941     BSA:          1.904 m Patient Age:    70 years      BP:           121/70 mmHg Patient Gender: M             HR:           62 bpm. Exam Location:  Outpatient Procedure: 2D Echo, Cardiac Doppler and Color Doppler Indications:    congestive heart failure  History:        Patient has prior history of Echocardiogram examinations, most                 recent 05/31/2022. CAD; Risk Factors:Hypertension and                 Dyslipidemia.                 Aortic Valve: 26 mm Edwards valve is present in the aortic                 position. Procedure Date: 05/08/19.  Sonographer:    Delcie Roch RDCS Referring Phys: [phone] [facility]  Sonographer Comments: Technically difficult study due to poor echo windows. Image acquisition challenging due to respiratory motion. IMPRESSIONS  1. Left ventricular ejection fraction, by estimation, is 65 to 70%. The left ventricle has normal function. The left ventricle has no regional wall motion abnormalities. Left ventricular diastolic parameters are consistent with Grade I diastolic dysfunction (impaired relaxation).  2. Right ventricular systolic function is normal. The right ventricular size is normal.  3. The mitral valve is normal in structure. Mild mitral valve regurgitation. No evidence of mitral stenosis. Moderate mitral annular calcification.  4. The aortic valve has been repaired/replaced. Aortic valve regurgitation is not visualized. No aortic stenosis is present. There is a 26 mm Edwards valve present in the aortic position. Procedure Date: 05/08/19. Echo findings are consistent with normal  structure and function of the aortic valve prosthesis. Aortic valve area, by VTI measures 1.74 cm. Aortic  valve mean gradient measures 13.0 mmHg. Aortic valve Vmax measures 2.37 m/s.  5. The inferior vena cava is normal in size with greater than 50% respiratory variability, suggesting right atrial pressure of 3 mmHg. Comparison(s): Prior images reviewed side by side. 06/10/2022 - TAVR valve mean gradient 19 mmHg. FINDINGS  Left Ventricle: Left ventricular ejection fraction, by estimation, is 65 to 70%. The left ventricle has normal function. The left ventricle has no regional wall motion abnormalities. The left ventricular internal cavity size was normal in size. There is  no left ventricular hypertrophy. Left ventricular diastolic parameters are consistent with Grade I diastolic dysfunction (impaired relaxation). Right Ventricle: The right ventricular size is normal. No increase in right ventricular wall thickness. Right ventricular systolic function is normal. Left Atrium: Left atrial size was normal in size. Right Atrium: Right atrial size was normal in size. Pericardium: There is no evidence of pericardial effusion. Mitral Valve: The mitral valve is normal in structure. Moderate mitral annular calcification. Mild mitral valve regurgitation. No evidence of mitral valve stenosis. Tricuspid Valve: The tricuspid valve is normal in structure. Tricuspid valve regurgitation is not demonstrated. No evidence of tricuspid stenosis. Aortic Valve: The aortic valve has been repaired/replaced. Aortic valve regurgitation is not visualized. No aortic stenosis is present. Aortic valve mean gradient measures 13.0 mmHg. Aortic valve peak gradient measures 22.5 mmHg. Aortic valve  area, by VTI measures 1.74 cm. There is a 26 mm Edwards valve present in the aortic position. Procedure Date: 05/08/19. Echo findings are consistent with normal structure and function of the aortic valve prosthesis. Pulmonic Valve: The pulmonic valve was normal in structure. Pulmonic valve regurgitation is not visualized. No evidence of pulmonic stenosis.  Aorta: The aortic root is normal in size and structure. Venous: The inferior vena cava is normal in size with greater than 50% respiratory variability, suggesting right atrial pressure of 3 mmHg. IAS/Shunts: No atrial level shunt detected by color flow Doppler.  LEFT VENTRICLE PLAX 2D LVIDd:         4.60 cm   Diastology LVIDs:         2.60 cm   LV e' medial:    6.09 cm/s LV PW:         0.90 cm   LV E/e' medial:  12.6 LV IVS:        1.10 cm   LV e' lateral:   6.31 cm/s LVOT diam:     2.00 cm   LV E/e' lateral: 12.2 LV SV:         85 LV SV Index:   45 LVOT Area:     3.14 cm  RIGHT VENTRICLE             IVC RV Basal diam:  2.50 cm     IVC diam: 1.60 cm RV S prime:     15.60 cm/s TAPSE (M-mode): 2.3 cm LEFT ATRIUM             Index        RIGHT ATRIUM           Index LA diam:        3.50 cm 1.84 cm/m   RA Area:     14.10 cm LA Vol (A2C):   42.8 ml 22.48 ml/m  RA Volume:   29.00 ml  15.23 ml/m LA Vol (A4C):   40.5 ml 21.27 ml/m LA Biplane Vol: 42.9 ml 22.54 ml/m  AORTIC VALVE AV Area (Vmax):    1.43 cm AV Area (Vmean):   1.37 cm AV Area (VTI):     1.74 cm AV Vmax:           237.00 cm/s AV Vmean:          169.000 cm/s AV VTI:            0.489 m AV Peak Grad:      22.5 mmHg AV Mean Grad:      13.0 mmHg LVOT Vmax:         108.00 cm/s LVOT Vmean:        73.433 cm/s LVOT VTI:          0.271 m LVOT/AV VTI ratio: 0.55 MITRAL VALVE MV Area (PHT): 2.45 cm    SHUNTS MV Decel Time: 310 msec    Systemic VTI:  0.27 m MV E velocity: 76.70 cm/s  Systemic Diam: 2.00 cm MV A velocity: 74.60 cm/s MV E/A ratio:  1.03 Donato Schultz MD Electronically signed by Donato Schultz MD Signature Date/Time: 07/20/2023/4:47:23 PM    Final     Assessment & Plan:   Problem List Items Addressed This Visit     CAD (coronary artery disease)   Monitor labs No CP      Essential hypertension   On Metoprolol, spironolactone      Gait disorder   Using a cane  Malignant neoplasm metastatic to bone (HCC) - Primary   MM - f/u w/Dr  Candise Che Oxycodone prn for chronic pain Oxycontin - not covered.  Potential benefits of a long term opioids use as well as potential risks (i.e. addiction risk, apnea etc) and complications (i.e. Somnolence, constipation and others) were explained to the patient and were aknowledged.      PAF (paroxysmal atrial fibrillation) (HCC)   On ASA, Eliquis      Thoracic spine pain    Oxycodone renewed  Potential benefits of a long term opioids use as well as potential risks (i.e. addiction risk, apnea etc) and complications (i.e. Somnolence, constipation and others) were explained to the patient and were aknowledged.       Relevant Medications   oxyCODONE (OXY IR/ROXICODONE) 5 MG immediate release tablet      Meds ordered this encounter  Medications   oxyCODONE (OXY IR/ROXICODONE) 5 MG immediate release tablet    Sig: Take 1-2 tablets (5-10 mg total) by mouth 2 (two) times daily as needed for severe pain (pain score 7-10).    Dispense:  120 tablet    Refill:  0      Follow-up: Return in about 3 months (around 02/24/2024) for a follow-up visit.  Sonda Primes, MD

## 2023-11-24 NOTE — Assessment & Plan Note (Signed)
Monitor labs No CP

## 2023-11-24 NOTE — Assessment & Plan Note (Signed)
On Metoprolol, spironolactone

## 2023-11-24 NOTE — Assessment & Plan Note (Signed)
  Oxycodone renewed  Potential benefits of a long term opioids use as well as potential risks (i.e. addiction risk, apnea etc) and complications (i.e. Somnolence, constipation and others) were explained to the patient and were aknowledged.

## 2023-11-24 NOTE — Patient Instructions (Signed)
 USEFUL THINGS FOR ARTHRITIS and musculoskeletal pains:    A "rice sock heating pad" refers to a homemade heating pad created by filling a sock with uncooked rice, which can be heated in a microwave to provide a warm compress for sore muscles, pain relief, or other applications; essentially, it's a simple way to generate heat using readily available materials.  Key points about rice sock heat: How to make it: Fill a clean sock (preferably a tube sock) about 2/3 full with uncooked rice, tie a knot at the top to secure the rice inside.  Heating it up: Place the rice sock in the microwave and heat in short intervals (usually around 30 seconds at a time) until it reaches the desired warmth.  Important considerations: Check temperature before applying: Always test the temperature of the rice sock before applying it to your skin to avoid burns.  Use a towel to protect skin: Wrap the rice sock in a thin towel to distribute the heat evenly and protect your skin.  Uses: Muscle aches and pains  Menstrual cramps  Neck pain  Arthritis discomfort   SILICONE PADS: Use them to open jars and bottles    BRIX JAR OPENER: Use them to open jars    NITRILE COATED GARDEN GLOVES: Use down to open jars, lift boxes, etc.   THUMB BRACE: Use it for thumb arthritis flareup pain     BLUE EMU CREAM: Use it 2-3 times a day on painful areas

## 2023-11-29 ENCOUNTER — Other Ambulatory Visit: Payer: Self-pay | Admitting: Hematology

## 2023-11-29 DIAGNOSIS — C9000 Multiple myeloma not having achieved remission: Secondary | ICD-10-CM

## 2023-12-26 ENCOUNTER — Emergency Department (HOSPITAL_COMMUNITY)
Admission: EM | Admit: 2023-12-26 | Discharge: 2023-12-26 | Disposition: A | Discharge status: Home / Self Care | Attending: Emergency Medicine | Admitting: Emergency Medicine

## 2023-12-26 ENCOUNTER — Telehealth (HOSPITAL_COMMUNITY): Payer: Self-pay | Admitting: *Deleted

## 2023-12-26 ENCOUNTER — Emergency Department (HOSPITAL_COMMUNITY)

## 2023-12-26 ENCOUNTER — Other Ambulatory Visit: Payer: Self-pay

## 2023-12-26 DIAGNOSIS — J9811 Atelectasis: Secondary | ICD-10-CM | POA: Diagnosis not present

## 2023-12-26 DIAGNOSIS — Z7982 Long term (current) use of aspirin: Secondary | ICD-10-CM | POA: Diagnosis not present

## 2023-12-26 DIAGNOSIS — I4891 Unspecified atrial fibrillation: Secondary | ICD-10-CM | POA: Diagnosis not present

## 2023-12-26 DIAGNOSIS — Z7901 Long term (current) use of anticoagulants: Secondary | ICD-10-CM | POA: Insufficient documentation

## 2023-12-26 DIAGNOSIS — I10 Essential (primary) hypertension: Secondary | ICD-10-CM | POA: Insufficient documentation

## 2023-12-26 DIAGNOSIS — R079 Chest pain, unspecified: Secondary | ICD-10-CM | POA: Diagnosis present

## 2023-12-26 LAB — COMPREHENSIVE METABOLIC PANEL WITH GFR
ALT: 40 U/L (ref 0–44)
AST: 32 U/L (ref 15–41)
Albumin: 3.9 g/dL (ref 3.5–5.0)
Alkaline Phosphatase: 58 U/L (ref 38–126)
Anion gap: 8 (ref 5–15)
BUN: 17 mg/dL (ref 8–23)
CO2: 27 mmol/L (ref 22–32)
Calcium: 9.1 mg/dL (ref 8.9–10.3)
Chloride: 104 mmol/L (ref 98–111)
Creatinine, Ser: 1.26 mg/dL — ABNORMAL HIGH (ref 0.61–1.24)
GFR, Estimated: 57 mL/min — ABNORMAL LOW (ref >60)
Glucose, Bld: 114 mg/dL — ABNORMAL HIGH (ref 70–99)
Potassium: 4.1 mmol/L (ref 3.5–5.1)
Sodium: 139 mmol/L (ref 135–145)
Total Bilirubin: 0.5 mg/dL (ref 0.0–1.2)
Total Protein: 6.1 g/dL — ABNORMAL LOW (ref 6.5–8.1)

## 2023-12-26 LAB — CBC
HCT: 48.4 % (ref 39.0–52.0)
Hemoglobin: 15.8 g/dL (ref 13.0–17.0)
MCH: 31.2 pg (ref 26.0–34.0)
MCHC: 32.6 g/dL (ref 30.0–36.0)
MCV: 95.7 fL (ref 80.0–100.0)
Platelets: 105 10*3/uL — ABNORMAL LOW (ref 150–400)
RBC: 5.06 MIL/uL (ref 4.22–5.81)
RDW: 13.3 % (ref 11.5–15.5)
WBC: 4.7 10*3/uL (ref 4.0–10.5)
nRBC: 0 % (ref 0.0–0.2)

## 2023-12-26 LAB — MAGNESIUM: Magnesium: 2.2 mg/dL (ref 1.7–2.4)

## 2023-12-26 LAB — TROPONIN I (HIGH SENSITIVITY)
Troponin I (High Sensitivity): 16 ng/L (ref <18)
Troponin I (High Sensitivity): 18 ng/L — ABNORMAL HIGH (ref <18)

## 2023-12-26 MED ORDER — PROPOFOL 10 MG/ML IV BOLUS
INTRAVENOUS | Status: AC | PRN
  Administered 2023-12-26: 40 mg via INTRAVENOUS

## 2023-12-26 MED ORDER — PROPOFOL 10 MG/ML IV BOLUS
0.5000 mg/kg | Freq: Once | INTRAVENOUS | Status: DC
  Filled 2023-12-26: qty 20

## 2023-12-26 NOTE — ED Provider Notes (Signed)
 [facility] Provider Note   CSN: [id number] Arrival date & time: 12/26/23  1021     History  Chief Complaint  Patient presents with   Shortness of Breath   Atrial Fibrillation   Chest Pain   gait imbalance   HPI [name] [name] is a 83 y.o. male with history of paroxysmal A-fib on Eliquis, ascending aortic aneurysm, hypertension, multiple myeloma presenting for chest pain.  Started Saturday with shortness of breath.  Located in the left chest.  Its pressure-like and nonradiating.  It is worse with exertion.  Denies lower extremity swelling or pain.  Also has been noticing that his heart rate has been fluctuating 60-140.  He states this is on usual for him, that is normally in the high 50s.  States he is compliant with his Eliquis has not missed a dose.  He states that overall his chest pain has improved since Saturday but he still concerned about his heart rate fluctuating.  His cardiologist advised him to come here for further evaluation.  Has required cardioversion in the past most recently in May 2024. Denies cough and fever.  Also mentioned that he has neuropathy in both lower extremities.  States the pain has been worse in both legs in the last week.  Denies any swelling.  It has impacted his mobility.  States he still able to walk with his cane as he usually does but it is notably more painful.   Shortness of Breath Associated symptoms: chest pain   Atrial Fibrillation Associated symptoms include chest pain and shortness of breath.  Chest Pain Associated symptoms: shortness of breath        Home Medications Prior to Admission medications   Medication Sig Start Date End Date Taking? Authorizing Provider  aspirin 81 MG chewable tablet CHEW 1 TABLET BY MOUTH DAILY. Patient taking differently: Chew 81 mg by mouth daily. 12/17/22  Yes Darlis Eisenmenger, MD  Carboxymethylcellul-Glycerin (LUBRICATING EYE DROPS OP) Place 1 drop into both  eyes daily as needed (dry eyes).   Yes [provider]  Cholecalciferol (VITAMIN D) 50 MCG (2000 UT) tablet Take 2,000 Units by mouth daily.   Yes [provider]  diclofenac Sodium (VOLTAREN) 1 % GEL Apply 1 application topically 2 (two) times daily as needed (pain.).   Yes [provider]  ELIQUIS 5 MG TABS tablet TAKE 1 TABLET (5 MG TOTAL) BY MOUTH 2 (TWO) TIMES DAILY. NEEDS FOLLOW UP APPOINTMENT FORE MORE REFILLS Patient taking differently: Take 5 mg by mouth 2 (two) times daily. 11/21/23  Yes Darlis Eisenmenger, MD  gabapentin (NEURONTIN) 300 MG capsule Take 1 capsule (300 mg total) by mouth at bedtime. 01/19/23  Yes Kale, Gautam Kishore, MD  ipratropium (ATROVENT) 0.06 % nasal spray Place 2 sprays into the nose 3 (three) times daily. Patient taking differently: Place 1 spray into the nose 3 (three) times daily as needed for rhinitis. 08/25/23 08/24/24 Yes [name], Aleksei V, MD  lenalidomide (REVLIMID) 5 MG capsule TAKE 1 CAPSULE BY MOUTH 1 TIME A DAY FOR 21 DAYS ON THEN 7 DAYS OFF Patient taking differently: Take 5 mg by mouth See admin instructions. Take one capsule by mouth daily for 21 days. For 7 days do not take. Repeat 11/29/23  Yes Frankie Israel, MD  magnesium oxide (MAG-OX) 400 (240 Mg) MG tablet Take 400 mg by mouth daily.   Yes [provider]  metoprolol succinate (TOPROL-XL) 25 MG 24 hr tablet TAKE  1 TABLET (25 MG TOTAL) BY MOUTH AT BEDTIME 10/14/23  Yes Laurey Morale, MD  metroNIDAZOLE (METROCREAM) 0.75 % cream Apply 1 application  topically 2 (two) times daily as needed (rash).   Yes [provider]  oxyCODONE (OXY IR/ROXICODONE) 5 MG immediate release tablet Take 1-2 tablets (5-10 mg total) by mouth 2 (two) times daily as needed for severe pain (pain score 7-10). 11/24/23  Yes [name], Georgina Quint, MD  senna-docusate (SENOKOT-S) 8.6-50 MG tablet Take 2 tablets by mouth at bedtime.   Yes [provider]  spironolactone  (ALDACTONE) 25 MG tablet TAKE 1 TABLET BY MOUTH EVERY DAY 10/04/23  Yes Laurey Morale, MD  Vitamin D, Ergocalciferol, (DRISDOL) 1.25 MG (50000 UNIT) CAPS capsule TAKE 1 CAPSULE BY MOUTH ONE TIME PER WEEK Patient taking differently: Take 50,000 Units by mouth every 7 (seven) days. 05/16/23  Yes Johney Maine, MD  b complex vitamins tablet Take 1 tablet by mouth daily. Patient not taking: Reported on 12/26/2023 09/25/19   [name], Georgina Quint, MD  Calcium-Magnesium 500-250 MG TABS Take 1 tablet by mouth daily. Patient not taking: Reported on 12/26/2023 01/17/23   Johney Maine, MD  polyethylene glycol powder [facility]) 17 GM/SCOOP powder Take 17-34 g by mouth 2 (two) times daily as needed for moderate constipation. Patient not taking: Reported on 12/26/2023 09/25/19   [name], Georgina Quint, MD      Allergies    Xarelto [rivaroxaban], Corticosteroids, Ramipril, Zolpidem, and Benazepril    Review of Systems   Review of Systems  Respiratory:  Positive for shortness of breath.   Cardiovascular:  Positive for chest pain.    Physical Exam Updated Vital Signs BP 113/70   Pulse (!) 57   Temp 97.7 F (36.5 C) (Oral)   Resp 13   Ht 5\' 9"  (1.753 m)   Wt 76.2 kg   SpO2 100%   BMI 24.81 kg/m  Physical Exam Vitals and nursing note reviewed.  HENT:     Head: Normocephalic and atraumatic.     Mouth/Throat:     Mouth: Mucous membranes are moist.  Eyes:     General:        Right eye: No discharge.        Left eye: No discharge.     Conjunctiva/sclera: Conjunctivae normal.  Cardiovascular:     Rate and Rhythm: Tachycardia present. Rhythm irregular.     Pulses: Normal pulses.     Heart sounds: Normal heart sounds.  Pulmonary:     Effort: Pulmonary effort is normal.     Breath sounds: Normal breath sounds.  Abdominal:     General: Abdomen is flat.     Palpations: Abdomen is soft.  Skin:    General: Skin is warm and dry.  Neurological:     General: No focal deficit  present.  Psychiatric:        Mood and Affect: Mood normal.     ED Results / Procedures / Treatments   Labs (all labs ordered are listed, but only abnormal results are displayed) Labs Reviewed  CBC - Abnormal; Notable for the following components:      Result Value   Platelets 105 (*)    All other components within normal limits  COMPREHENSIVE METABOLIC PANEL WITH GFR - Abnormal; Notable for the following components:   Glucose, Bld 114 (*)    Creatinine, Ser 1.26 (*)    Total Protein 6.1 (*)    GFR, Estimated 57 (*)  All other components within normal limits  TROPONIN I (HIGH SENSITIVITY) - Abnormal; Notable for the following components:   Troponin I (High Sensitivity) 18 (*)    All other components within normal limits  MAGNESIUM  TROPONIN I (HIGH SENSITIVITY)    EKG EKG Interpretation Date/Time:  Monday December 26 2023 10:41:05 EDT Ventricular Rate:  91 PR Interval:    QRS Duration:  90 QT Interval:  376 QTC Calculation: 462 R Axis:   -7  Text Interpretation: Atrial fibrillation ST & T wave abnormality, consider inferior ischemia Prolonged QT Abnormal ECG st depression II and III, mild avf new from first prior when in [place] Confirmed by Auston Blush [phone]) on 12/26/2023 12:18:01 PM  Radiology DG Chest Port 1 View Result Date: 12/26/2023 CLINICAL DATA:  Atrial fibrillation. EXAM: PORTABLE CHEST 1 VIEW COMPARISON:  X-ray 02/09/2023 and older FINDINGS: Stable cardiopericardial silhouette tortuous aorta. Status post TAVR. Minimal linear opacity at the lung bases. Atelectasis is favored. Chronic interstitial lung changes identified. No pneumothorax, effusion, consolidation or edema. Overlapping cardiac leads. Degenerative changes of the spine. IMPRESSION: Status post TAVR.  Chronic changes.  Basilar scar atelectasis. Electronically Signed   By: Adrianna Horde M.D.   On: 12/26/2023 13:07    Procedures Procedures    Medications Ordered in ED Medications  propofol (DIPRIVAN)  10 mg/mL bolus/IV push 38.1 mg (38.1 mg Intravenous Not Given 12/26/23 1325)  propofol (DIPRIVAN) 10 mg/mL bolus/IV push (40 mg Intravenous Given 12/26/23 1319)    ED Course/ Medical Decision Making/ A&P                                 Medical Decision Making Amount and/or Complexity of Data Reviewed Labs: ordered. Radiology: ordered.   Initial Impression and Ddx 83 year old well-appearing male presenting for chest pain.  Exam notable for irregular tachycardia.  DDx includes A-fib with RVR, sepsis, ACS, CHF exacerbation, electrolyte derangement, PE, other. Patient PMH that increases complexity of ED encounter:  history of paroxysmal A-fib on Eliquis, ascending aortic aneurysm, hypertension, multiple myeloma   Interpretation of Diagnostics - I independent reviewed and interpreted the labs as followed: elevated trop (18 then 16), creatinine slightly elevated,   - I independently visualized the following imaging with scope of interpretation limited to determining acute life threatening conditions related to emergency care: CXR, which revealed basilar scar atelectasis but otherwise no acute findings  -I personally reviewed EKG which revealed atrial fibrillation, after cardioversion EKG revealed sinus rhythm  Patient Reassessment and Ultimate Disposition/Management Patient remained well throughout encounter, no acute distress hemodynamic stable.  Intermittently tachycardic and persistently in A-fib.  Proceeded with cardioversion.  Light sedation with propofol.  Procedure went well.  Converted to sinus after 1 attempt.  See procedure note.  Placed in observation for about an hour and he remained well, stating no longer had chest pain.  Felt that he was appropriate for discharge with cardiology follow-up.  Discussed return precautions.  Discharged in good condition.  Patient management required discussion with the following services or consulting groups:  None  Complexity of Problems  Addressed Acute complicated illness or Injury  Additional Data Reviewed and Analyzed Further history obtained from: Past medical history and medications listed in the EMR and Prior ED visit notes  Patient Encounter Risk Assessment None         Final Clinical Impression(s) / ED Diagnoses Final diagnoses:  Atrial fibrillation with rapid ventricular response (HCC)  Rx / DC Orders ED Discharge Orders     None         Janalee Mcmurray, PA-C 12/26/23 1506    Auston Blush, MD 12/29/23 [phone]

## 2023-12-26 NOTE — Telephone Encounter (Signed)
 Pt called to report he is having irreg HR, he states it's jumping from 80-120s and he is feeling SOB and CP, he states this does not feel like his normal afib, it is different. Offered appt in clinic this afternoon but pt states he feels it can not wait until then he will go to ER, he states he started to go last night but didn't, he will report to ER now, mess to Dr Mitzie Anda as [name] [name]

## 2023-12-26 NOTE — Discharge Instructions (Addendum)
 Evaluation today revealed that you were persistently in atrial fibrillation.  Cardioversion procedure went very well.  Please follow-up with your cardiologist.  If you start to have chest pain again, shortness of breath or palpitations or any other concerning symptom please return to the ED for further evaluation.

## 2023-12-26 NOTE — ED Provider Notes (Signed)
 .Cardioversion  Date/Time: 12/26/2023 1:42 PM  Performed by: Auston Blush, MD Authorized by: Auston Blush, MD   Consent:    Consent obtained:  Verbal and written   Consent given by:  Patient   Risks discussed:  Cutaneous burn, death and induced arrhythmia   Alternatives discussed:  Rate-control medication and no treatment Universal protocol:    Immediately prior to procedure a time out was called: yes     Patient identity confirmed:  Verbally with patient and arm band Pre-procedure details:    Cardioversion basis:  Emergent   Rhythm:  Atrial fibrillation   Electrode placement:  Anterior-posterior Patient sedated: Yes. Refer to sedation procedure documentation for details of sedation.  Attempt one:    Cardioversion mode:  Synchronous   Waveform:  Biphasic   Shock (Joules):  120   Shock outcome:  Conversion to normal sinus rhythm Post-procedure details:    Patient status:  Alert   Patient tolerance of procedure:  Tolerated with difficulty .Sedation  Date/Time: 12/26/2023 1:43 PM  Performed by: Auston Blush, MD Authorized by: Auston Blush, MD   Consent:    Consent obtained:  Verbal   Consent given by:  Patient   Risks discussed:  Allergic reaction, dysrhythmia, inadequate sedation, nausea, prolonged hypoxia resulting in organ damage, prolonged sedation necessitating reversal, respiratory compromise necessitating ventilatory assistance and intubation and vomiting   Alternatives discussed:  Analgesia without sedation, anxiolysis and regional anesthesia Universal protocol:    Procedure explained and questions answered to patient or proxy's satisfaction: yes     Relevant documents present and verified: yes     Test results available: yes     Imaging studies available: yes     Required blood products, implants, devices, and special equipment available: yes     Site/side marked: yes     Immediately prior to procedure, a time out was called: yes     Patient identity confirmed:   Verbally with patient Indications:    Procedure performed:  Cardioversion   Procedure necessitating sedation performed by:  Physician performing sedation Pre-sedation assessment:    Time since last food or drink:  3   NPO status caution: urgency dictates proceeding with non-ideal NPO status     ASA classification: class 1 - normal, healthy patient     Mouth opening:  3 or more finger widths   Thyromental distance:  4 finger widths   Mallampati score:  I - soft palate, uvula, fauces, pillars visible   Neck mobility: normal     Pre-sedation assessments completed and reviewed: airway patency, cardiovascular function, hydration status, mental status, nausea/vomiting, pain level, respiratory function and temperature     Pre-sedation assessments completed and reviewed comment:  Done A pre-sedation assessment was completed prior to the start of the procedure Immediate pre-procedure details:    Reassessment: Patient reassessed immediately prior to procedure     Reviewed: vital signs, relevant labs/tests and NPO status     Verified: bag valve mask available, emergency equipment available, intubation equipment available, IV patency confirmed, oxygen available and suction available   Procedure details (see MAR for exact dosages):    Preoxygenation:  Nasal cannula   Sedation:  Propofol   Intended level of sedation: deep   Intra-procedure monitoring:  Blood pressure monitoring, cardiac monitor, continuous pulse oximetry, frequent LOC assessments, frequent vital sign checks and continuous capnometry   Intra-procedure events: none     Total Provider sedation time (minutes):  30 Post-procedure details:   A post-sedation assessment was completed  following the completion of the procedure.   Attendance: Constant attendance by certified staff until patient recovered     Recovery: Patient returned to pre-procedure baseline     Post-sedation assessments completed and reviewed: airway patency, cardiovascular  function, hydration status, mental status, nausea/vomiting, pain level, respiratory function and temperature     Patient is stable for discharge or admission: yes     Procedure completion:  Tolerated well, no immediate complications     Auston Blush, MD 12/26/23 1346

## 2023-12-26 NOTE — Progress Notes (Signed)
 RT called to room for cardioversion.  Patient wearing ETCO2 [place]; Patient was cardioverted and able to give thumbs up post procedure.  RT will continue to monitor.

## 2023-12-26 NOTE — ED Triage Notes (Signed)
 Pt. Stated, I started having some SOB , chest pain , and A-Fib. I feel like my heart goes to 60 to 140 . This all started Saturday. I called my Dr. And he said to to come to ED.

## 2023-12-27 ENCOUNTER — Other Ambulatory Visit: Payer: Self-pay | Admitting: Hematology

## 2023-12-27 DIAGNOSIS — C9000 Multiple myeloma not having achieved remission: Secondary | ICD-10-CM

## 2023-12-28 ENCOUNTER — Encounter: Payer: Self-pay | Admitting: Hematology

## 2024-01-10 ENCOUNTER — Other Ambulatory Visit: Payer: Self-pay

## 2024-01-10 DIAGNOSIS — C9000 Multiple myeloma not having achieved remission: Secondary | ICD-10-CM

## 2024-01-11 ENCOUNTER — Inpatient Hospital Stay (HOSPITAL_BASED_OUTPATIENT_CLINIC_OR_DEPARTMENT_OTHER): Payer: Medicare Other | Admitting: Hematology

## 2024-01-11 ENCOUNTER — Inpatient Hospital Stay: Payer: Medicare Other

## 2024-01-11 ENCOUNTER — Inpatient Hospital Stay: Payer: Medicare Other | Attending: Hematology

## 2024-01-11 VITALS — BP 119/70 | HR 59 | Temp 98.1°F | Resp 16 | Ht 69.0 in | Wt 169.7 lb

## 2024-01-11 DIAGNOSIS — D696 Thrombocytopenia, unspecified: Secondary | ICD-10-CM | POA: Insufficient documentation

## 2024-01-11 DIAGNOSIS — C7951 Secondary malignant neoplasm of bone: Secondary | ICD-10-CM

## 2024-01-11 DIAGNOSIS — Z800 Family history of malignant neoplasm of digestive organs: Secondary | ICD-10-CM | POA: Diagnosis not present

## 2024-01-11 DIAGNOSIS — G620 Drug-induced polyneuropathy: Secondary | ICD-10-CM | POA: Insufficient documentation

## 2024-01-11 DIAGNOSIS — C9000 Multiple myeloma not having achieved remission: Secondary | ICD-10-CM

## 2024-01-11 DIAGNOSIS — I350 Nonrheumatic aortic (valve) stenosis: Secondary | ICD-10-CM | POA: Insufficient documentation

## 2024-01-11 DIAGNOSIS — C9002 Multiple myeloma in relapse: Secondary | ICD-10-CM | POA: Diagnosis not present

## 2024-01-11 DIAGNOSIS — Z7189 Other specified counseling: Secondary | ICD-10-CM

## 2024-01-11 LAB — CMP (CANCER CENTER ONLY)
ALT: 27 U/L (ref 0–44)
AST: 23 U/L (ref 15–41)
Albumin: 4.3 g/dL (ref 3.5–5.0)
Alkaline Phosphatase: 76 U/L (ref 38–126)
Anion gap: 4 — ABNORMAL LOW (ref 5–15)
BUN: 17 mg/dL (ref 8–23)
CO2: 29 mmol/L (ref 22–32)
Calcium: 9 mg/dL (ref 8.9–10.3)
Chloride: 105 mmol/L (ref 98–111)
Creatinine: 1.05 mg/dL (ref 0.61–1.24)
GFR, Estimated: >60 mL/min (ref >60)
Glucose, Bld: 100 mg/dL — ABNORMAL HIGH (ref 70–99)
Potassium: 4.2 mmol/L (ref 3.5–5.1)
Sodium: 138 mmol/L (ref 135–145)
Total Bilirubin: 0.5 mg/dL (ref 0.0–1.2)
Total Protein: 6.4 g/dL — ABNORMAL LOW (ref 6.5–8.1)

## 2024-01-11 LAB — CBC WITH DIFFERENTIAL (CANCER CENTER ONLY)
Abs Immature Granulocytes: 0.02 10*3/uL (ref 0.00–0.07)
Basophils Absolute: 0.1 10*3/uL (ref 0.0–0.1)
Basophils Relative: 2 %
Eosinophils Absolute: 0.1 10*3/uL (ref 0.0–0.5)
Eosinophils Relative: 3 %
HCT: 44.2 % (ref 39.0–52.0)
Hemoglobin: 14.4 g/dL (ref 13.0–17.0)
Immature Granulocytes: 1 %
Lymphocytes Relative: 24 %
Lymphs Abs: 0.8 10*3/uL (ref 0.7–4.0)
MCH: 30.6 pg (ref 26.0–34.0)
MCHC: 32.6 g/dL (ref 30.0–36.0)
MCV: 93.8 fL (ref 80.0–100.0)
Monocytes Absolute: 0.4 10*3/uL (ref 0.1–1.0)
Monocytes Relative: 14 %
Neutro Abs: 1.8 10*3/uL (ref 1.7–7.7)
Neutrophils Relative %: 56 %
Platelet Count: 121 10*3/uL — ABNORMAL LOW (ref 150–400)
RBC: 4.71 MIL/uL (ref 4.22–5.81)
RDW: 13.2 % (ref 11.5–15.5)
WBC Count: 3.2 10*3/uL — ABNORMAL LOW (ref 4.0–10.5)
nRBC: 0 % (ref 0.0–0.2)

## 2024-01-11 MED ORDER — ZOLEDRONIC ACID 4 MG/100ML IV SOLN
4.0000 mg | Freq: Once | INTRAVENOUS | Status: AC
  Administered 2024-01-11: 4 mg via INTRAVENOUS
  Filled 2024-01-11: qty 100

## 2024-01-11 MED ORDER — SODIUM CHLORIDE 0.9 % IV SOLN: Freq: Once | INTRAVENOUS | Status: AC

## 2024-01-11 NOTE — Progress Notes (Signed)
 HEMATOLOGY/ONCOLOGY CLINIC NOTE  Date of Service: 01/11/24    Patient Care Team: Genia Kettering, MD as PCP - General Darlis Eisenmenger, MD as PCP - Advanced Heart Failure (Cardiology) Darlis Eisenmenger, MD as PCP - Cardiology (Cardiology) Manya Sells, MD as Attending Physician (Neurosurgery) Harlen Lick, MD as Consulting Physician (Dermatology) Lind Repine, MD as Consulting Physician (Pulmonary Disease) Patel, [name], DO as Consulting Physician (Neurology)   CHIEF COMPLAINTS/PURPOSE OF CONSULTATION:  Follow-up for continued evaluation and management of multiple myeloma  HISTORY OF PRESENTING ILLNESS:  Please see previous notes for details on initial presentation  INTERVAL HISTORY:  [name] [name] Is a 83 y.o. male is here for continued evaluation and management of multiple myeloma.  Today, he reports that he has had fairly stable symptoms overall since his last clinical visit.   Patient was last seen by me on 10/12/2023 and complained of neuropathic pain in his feet causing sleep disturbances due to jolting pain, his legs "giving-up early", feet swelling, and back pain.  He presented to the ED on 12/26/2023 for atrial fibrillation. Patient received a cardioversion.   Patient presents with a cane during today's visit.   He reports that his HR generally ranges 55-60 bpm. On the evening of the day of his ER visit, he reports having a pulse of 140 bpm which would fluctuate down to 60 bpm.   Patient continues to be on Metoprolol , Aspirin , and Eliquis .   He reports that his neuropathic pain in his feet has been worse today.   He complains of loss of strength. Patient reports that he has been eating well. His weight in clinic today is 169 pounds. He notes previously weighing 200 pounds a while ago prior to his myeloma diagnosis.   He notes taking hot showers to manage bone stiffness.  Patient denies any infection issues. He has no leg swelling at this time.   He  reports that he sleeps fairly well. He reports that he sometimes awakens in the middle of the night and is generally able to return to sleep after praying for some time.   MEDICAL HISTORY:  Past Medical History:  Diagnosis Date   Ascending aortic aneurysm (HCC)    Bicuspid aortic valve    Cancer (HCC)    CHF NYHA class I (no symptoms from ordinary activities), acute, diastolic (HCC)    Dysrhythmia 1191   A fib   Fatty liver    mild   Fracture    left proximal humerus   GERD (gastroesophageal reflux disease)    GI bleeding 07/21/2018   post polypectomy   Hemorrhoids    HTN (hypertension)    Hypercholesteremia    Hypokalemia    Internal hemorrhoids    LBP (low back pain)    Moderate aortic stenosis    Osteoarthritis    Paroxysmal atrial fibrillation (HCC)    a. new onset Afib in 07/2008. He underwent ibutilide cardioversion successfully. b. Recurrence 01/2013 s/p TEE/DCCV - was on Xarelto  but he stopped it as he was convinced it was causing joint pn. c. Recurrence 01/2016 - spont conv to NSR. Pt took Eliquis  x1 mo then declined further anticoag. d. Recurrence 07/2016.   Pneumonia    Tubular adenoma of colon     SURGICAL HISTORY: Past Surgical History:  Procedure Laterality Date   BACK SURGERY  x12 years ago   CARDIAC CATHETERIZATION  2020   CARDIAC VALVE REPLACEMENT  2020   CARDIOVERSION N/A 01/26/2013  Procedure: CARDIOVERSION;  Surgeon: Darlis Eisenmenger, MD;  Location: [facility] ENDOSCOPY;  Service: Cardiovascular;  Laterality: N/A;   CARDIOVERSION N/A 10/28/2017   Procedure: CARDIOVERSION;  Surgeon: Darlis Eisenmenger, MD;  Location: [facility] ENDOSCOPY;  Service: Cardiovascular;  Laterality: N/A;   CARDIOVERSION N/A 03/03/2018   Procedure: CARDIOVERSION;  Surgeon: Lenise Quince, MD;  Location: [facility] ENDOSCOPY;  Service: Cardiovascular;  Laterality: N/A;   CARDIOVERSION N/A 09/19/2019   Procedure: CARDIOVERSION;  Surgeon: Darlis Eisenmenger, MD;  Location: [facility] ENDOSCOPY;  Service:  Cardiovascular;  Laterality: N/A;   COLONOSCOPY     COLONOSCOPY  07/17/2018   at [facility]   HEMORRHOID SURGERY     INGUINAL HERNIA REPAIR Left 03/18/2020   Procedure: OPEN LEFT INGUINAL HERNIA REPAIR;  Surgeon: Juanita Norlander, MD;  Location: [facility] OR;  Service: General;  Laterality: Left;   LUMBAR LAMINECTOMY     ORIF HUMERUS FRACTURE Left 01/15/2020   Procedure: OPEN REDUCTION INTERNAL FIXATION (ORIF) PROXIMAL HUMERUS FRACTURE;  Surgeon: Janeth Medicus, MD;  Location: [facility] OR;  Service: Orthopedics;  Laterality: Left;   POLYPECTOMY     RIGHT HEART CATH N/A 08/20/2019   Procedure: RIGHT HEART CATH;  Surgeon: Darlis Eisenmenger, MD;  Location: [facility] INVASIVE CV LAB;  Service: Cardiovascular;  Laterality: N/A;   RIGHT/LEFT HEART CATH AND CORONARY ANGIOGRAPHY N/A 03/07/2019   Procedure: RIGHT/LEFT HEART CATH AND CORONARY ANGIOGRAPHY;  Surgeon: Odie Benne, MD;  Location: [facility] INVASIVE CV LAB;  Service: Cardiovascular;  Laterality: N/A;   TAVAR  04/2019   TEE WITHOUT CARDIOVERSION N/A 01/26/2013   Procedure: TRANSESOPHAGEAL ECHOCARDIOGRAM (TEE);  Surgeon: Darlis Eisenmenger, MD;  Location: [facility] ENDOSCOPY;  Service: Cardiovascular;  Laterality: N/A;   TEE WITHOUT CARDIOVERSION N/A 10/28/2017   Procedure: TRANSESOPHAGEAL ECHOCARDIOGRAM (TEE);  Surgeon: Darlis Eisenmenger, MD;  Location: [facility] ENDOSCOPY;  Service: Cardiovascular;  Laterality: N/A;   TEE WITHOUT CARDIOVERSION N/A 05/08/2019   Procedure: TRANSESOPHAGEAL ECHOCARDIOGRAM (TEE);  Surgeon: Odie Benne, MD;  Location: [facility] INVASIVE CV LAB;  Service: Open Heart Surgery;  Laterality: N/A;   TEE WITHOUT CARDIOVERSION N/A 09/19/2019   Procedure: TRANSESOPHAGEAL ECHOCARDIOGRAM (TEE);  Surgeon: Darlis Eisenmenger, MD;  Location: [facility] ENDOSCOPY;  Service: Cardiovascular;  Laterality: N/A;   TRANSCATHETER AORTIC VALVE REPLACEMENT, TRANSFEMORAL N/A 05/08/2019   Procedure: TRANSCATHETER AORTIC VALVE REPLACEMENT, TRANSFEMORAL;  Surgeon: Odie Benne, MD;   Location: [facility] INVASIVE CV LAB;  Service: Open Heart Surgery;  Laterality: N/A;    SOCIAL HISTORY: Social History   Socioeconomic History   Marital status: Married    Spouse name: Not on file   Number of children: 0   Years of education: Not on file   Highest education level: Not on file  Occupational History   Occupation: Retired Garment/textile technologist: [employer]  Tobacco Use   Smoking status: Never   Smokeless tobacco: Never  Vaping Use   Vaping status: Never Used  Substance and Sexual Activity   Alcohol use: Not Currently   Drug use: No   Sexual activity: Yes  Other Topics Concern   Not on file  Social History Narrative   Patient lives in [place] w/ his wife. He is a native of [place]. 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   Right-handed   Caffeine: 2 cups coffee per day   Two story home   Social Drivers of Health   Financial Resource Strain: Not on file  Food Insecurity:  No Food Insecurity (02/15/2023)   Hunger Vital Sign    Worried About Running Out of Food in the Last Year: Never true    Ran Out of Food in the Last Year: Never true  Transportation Needs: No Transportation Needs (02/15/2023)   PRAPARE - Administrator, Civil Service (Medical): No    Lack of Transportation (Non-Medical): No  Physical Activity: Not on file  Stress: Not on file  Social Connections: Not on file  Intimate Partner Violence: Not on file    FAMILY HISTORY: Family History  Problem Relation Age of Onset   Colon cancer Mother 56   Hypertension Other    Coronary artery disease Neg Hx    Colon polyps Neg Hx    Esophageal cancer Neg Hx    Rectal cancer Neg Hx    Stomach cancer Neg Hx     ALLERGIES:  is allergic to xarelto  [rivaroxaban ], corticosteroids, ramipril , zolpidem, and benazepril .  MEDICATIONS:  Current Outpatient Medications  Medication Sig Dispense Refill   aspirin  81 MG chewable tablet CHEW 1 TABLET BY MOUTH  DAILY. (Patient taking differently: Chew 81 mg by mouth daily.) 90 tablet 1   b complex vitamins tablet Take 1 tablet by mouth daily. (Patient not taking: Reported on 12/26/2023) 100 tablet 3   Calcium -Magnesium  500-250 MG TABS Take 1 tablet by mouth daily. (Patient not taking: Reported on 12/26/2023) 30 tablet 6   Carboxymethylcellul-Glycerin (LUBRICATING EYE DROPS OP) Place 1 drop into both eyes daily as needed (dry eyes).     Cholecalciferol  (VITAMIN D ) 50 MCG (2000 UT) tablet Take 2,000 Units by mouth daily.     diclofenac  Sodium (VOLTAREN ) 1 % GEL Apply 1 application topically 2 (two) times daily as needed (pain.).     ELIQUIS  5 MG TABS tablet TAKE 1 TABLET (5 MG TOTAL) BY MOUTH 2 (TWO) TIMES DAILY. NEEDS FOLLOW UP APPOINTMENT FORE MORE REFILLS (Patient taking differently: Take 5 mg by mouth 2 (two) times daily.) 180 tablet 0   gabapentin  (NEURONTIN ) 300 MG capsule Take 1 capsule (300 mg total) by mouth at bedtime. 90 capsule 3   ipratropium (ATROVENT ) 0.06 % nasal spray Place 2 sprays into the nose 3 (three) times daily. (Patient taking differently: Place 1 spray into the nose 3 (three) times daily as needed for rhinitis.) 15 mL 2   lenalidomide  (REVLIMID ) 5 MG capsule Take 1 capsule (5 mg total) by mouth daily for 21 days. Take 1 capsule (5 mg) by mouth daily for 21 days and then take 7 days off. Repeat. 21 capsule 0   magnesium  oxide (MAG-OX) 400 (240 Mg) MG tablet Take 400 mg by mouth daily.     metoprolol  succinate (TOPROL -XL) 25 MG 24 hr tablet TAKE 1 TABLET (25 MG TOTAL) BY MOUTH AT BEDTIME 90 tablet 3   metroNIDAZOLE  (METROCREAM ) 0.75 % cream Apply 1 application  topically 2 (two) times daily as needed (rash).     oxyCODONE  (OXY IR/ROXICODONE ) 5 MG immediate release tablet Take 1-2 tablets (5-10 mg total) by mouth 2 (two) times daily as needed for severe pain (pain score 7-10). 120 tablet 0   polyethylene glycol powder (GLYCOLAX /MIRALAX ) 17 GM/SCOOP powder Take 17-34 g by mouth 2 (two) times  daily as needed for moderate constipation. (Patient not taking: Reported on 12/26/2023) 500 g 5   senna-docusate (SENOKOT-S) 8.6-50 MG tablet Take 2 tablets by mouth at bedtime.     spironolactone  (ALDACTONE ) 25 MG tablet TAKE 1 TABLET BY MOUTH EVERY DAY 90  tablet 3   Vitamin D , Ergocalciferol , (DRISDOL ) 1.25 MG (50000 UNIT) CAPS capsule TAKE 1 CAPSULE BY MOUTH ONE TIME PER WEEK (Patient taking differently: Take 50,000 Units by mouth every 7 (seven) days.) 12 capsule 3   No current facility-administered medications for this visit.    REVIEW OF SYSTEMS:    10 Point review of Systems was done is negative except as noted above.   PHYSICAL EXAMINATION: Vitals:   01/11/24 1233  BP: 119/70  Pulse: (!) 59  Resp: 16  Temp: 98.1 F (36.7 C)  SpO2: 100%   Wt Readings from Last 3 Encounters:  12/26/23 168 lb (76.2 kg)  11/24/23 170 lb (77.1 kg)  10/12/23 174 lb 11.2 oz (79.2 kg)   There is no height or weight on file to calculate BMI.    ECOG FS:2 - Symptomatic, <50% confined to bed   GENERAL:alert, in no acute distress and comfortable SKIN: no acute rashes, no significant lesions EYES: conjunctiva are pink and non-injected, sclera anicteric OROPHARYNX: MMM, no exudates, no oropharyngeal erythema or ulceration NECK: supple, no JVD LYMPH:  no palpable lymphadenopathy in the cervical, axillary or inguinal regions LUNGS: clear to auscultation b/l with normal respiratory effort HEART: regular rate & rhythm ABDOMEN:  normoactive bowel sounds , non tender, not distended. Extremity: no pedal edema PSYCH: alert & oriented x 3 with fluent speech NEURO: no focal motor/sensory deficits   LABORATORY DATA:  I have reviewed the data as listed  .    Latest Ref Rng & Units 01/11/2024   12:17 PM 12/26/2023   10:41 AM 10/12/2023   11:55 AM  CBC  WBC 4.0 - 10.5 K/uL 3.2  4.7  2.9   Hemoglobin 13.0 - 17.0 g/dL 08.6  57.8  46.9   Hematocrit 39.0 - 52.0 % 44.2  48.4  43.3   Platelets 150 - 400  K/uL 121  105  98     .    Latest Ref Rng & Units 01/11/2024   12:17 PM 12/26/2023   11:18 AM 10/12/2023   11:55 AM  CMP  Glucose 70 - 99 mg/dL 629  528  99   BUN 8 - 23 mg/dL 17  17  19    Creatinine 0.61 - 1.24 mg/dL 4.13  2.44  0.10   Sodium 135 - 145 mmol/L 138  139  140   Potassium 3.5 - 5.1 mmol/L 4.2  4.1  4.0   Chloride 98 - 111 mmol/L 105  104  107   CO2 22 - 32 mmol/L 29  27  27    Calcium  8.9 - 10.3 mg/dL 9.0  9.1  9.1   Total Protein 6.5 - 8.1 g/dL 6.4  6.1  6.4   Total Bilirubin 0.0 - 1.2 mg/dL 0.5  0.5  0.6   Alkaline Phos 38 - 126 U/L 76  58  64   AST 15 - 41 U/L 23  32  24   ALT 0 - 44 U/L 27  40  27          05/24/2019 Bone Marrow Biopsy    04/16/2019 Surgical Pathology:   Surgical Pathology  CASE: [id number]  PATIENT: [facility]  Bone Marrow Report      Clinical History: Multiple myeloma, remission status unspecified (HCC)  (BH)      DIAGNOSIS:   BONE MARROW, ASPIRATE, CLOT, CORE:  -Variably cellular bone marrow with trilineage hematopoiesis and 2%  plasma cells  -See comment   PERIPHERAL BLOOD:  -Slight thrombocytopenia  COMMENT:   The bone marrow is variably cellular with trilineage hematopoiesis  including abundant megakaryocytes in the more cellular areas.  In this  background, the plasma cells represent 2% of all cells in the aspirate  with interstitial cells and a few very minute clusters in the  clot/biopsy sections.  The latter in particular display kappa light  chain excess/restriction most suggestive of minimal residual plasma cell  neoplasm despite limited findings.  Correlation with cytogenetic and  FISH studies is recommended.    RADIOGRAPHIC STUDIES: I have personally reviewed the radiological images as listed and agreed with the findings in the report. DG Chest Port 1 View Result Date: 12/26/2023 CLINICAL DATA:  Atrial fibrillation. EXAM: PORTABLE CHEST 1 VIEW COMPARISON:  X-ray 02/09/2023 and older  FINDINGS: Stable cardiopericardial silhouette tortuous aorta. Status post TAVR. Minimal linear opacity at the lung bases. Atelectasis is favored. Chronic interstitial lung changes identified. No pneumothorax, effusion, consolidation or edema. Overlapping cardiac leads. Degenerative changes of the spine. IMPRESSION: Status post TAVR.  Chronic changes.  Basilar scar atelectasis. Electronically Signed   By: Adrianna Horde M.D.   On: 12/26/2023 13:07    ASSESSMENT & PLAN:   83 y.o. male with:  #1 Plasma cell myeloma-currently in relapse  03/28/2019 MRI pelvis w/wo contrast revealed "1. Destructive bone lesions as detailed above. Findings most consistent with metastatic disease. PET-CT may be helpful for further evaluation and to establish a primary tumor. The right pelvic bone lesions should be amenable to image guided biopsy but a PET scan may demonstrate easier/safer biopsy sites. 2. No intrapelvic mass or adenopathy. 3. Benign intraosseous lipoma involving the left anterior superior acetabulum."  04/06/2019 PET whole body revealed "1. Diffuse osseous metastatic disease as detailed above without findings for a primary neoplasm in the chest, abdomen or pelvis. The large destructive lesion involving the right ischium should be amenable to image guided biopsy. 2. Two small retroperitoneal lymph nodes and 1 small right obturator node showing hypermetabolism."   04/16/2019 Posterior right pelvis bone biopsy revealed "PLASMA CELL NEOPLASM"  05/24/2019 Bone Marrow Biopsy revealed "BONE MARROW: - CELLULAR MARROW WITH INVOLVEMENT BY PLASMA CELL NEOPLASM (20%) PERIPHERAL BLOOD: - MORPHOLOGICALLY UNREMARKABLE"  05/24/2019 FISH Panel revealed "ABNORMAL result with 11q+, 14q+ and +17"  #2 Severe aortic stenosis with bicuspid aortic valve -10/26/2018 ECHO revealed AVA at 0.8 cm2 and LV EF of 60-65% -05/08/2019 pt had a Transfemoral Transcatheter Aortic Valve Replacement  PET CT scan from 2/2 which shows There  is persistent low level radiotracer uptake associated with the large lucent lesion within the superior right acetabulum. Additionally, there is a new focal area of increased radiotracer uptake above the background low level activity within this lesion with SUV max of 3.92. On the previous exam there was relatively homogeneous low level uptake within this lesion within SUV max of 2.43. Imaging findings are concerning for residual metabolically active tumor. Patient has seen radiation oncology since his last visit and they have decided to proceed with involved site radiation therapy to the FDG avid lesion to reduce the risk of fracture.  -He had his bone marrow biopsy on 11/03/2021 which shows 2% abnormal plasma cells.  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 CBC normal, showed WBC of 3.2K, hemoglobin of 14.4, and platelets of 121K. -thrombocytopenia improved -platelets previously 105K two weeks ago up to 121K currently  -educated patient that there  can be some fluctuation in his blood counts depending on where he is in his Revlimid  cycle -last myeloma panel from 3 months ago showed M protein of 0.2 g/dL -myeloma panel from today is pending at time of clinical visit -Patient has no new symptoms suggestive of myeloma progression at this time.  -He has been tolerating Revlimid  with no new or major toxicities -continue maintenance Revlimid  5 mg p.o. daily 3 weeks on 1 week off.  -continue vitamin B complex and vitamin D  supplementation  -continue Zometa  as scheduled -patient shall return to clinic with labs in 3 months  FOLLOW UP: RTC with Dr Salomon Cree with labs and next dose of Zometa  in 3 months   The total time spent in the appointment was 30 minutes* .  All of the patient's questions were answered with apparent satisfaction. The patient knows to call the clinic with any  problems, questions or concerns.   Jacquelyn Matt MD MS AAHIVMS [facility] Hematology/Oncology Physician [facility]  .*Total Encounter Time as defined by the Centers for Medicare and Medicaid Services includes, in addition to the face-to-face time of a patient visit (documented in the note above) non-face-to-face time: obtaining and reviewing outside history, ordering and reviewing medications, tests or procedures, care coordination (communications with other health care professionals or caregivers) and documentation in the medical record.    I,Mitra Faeizi,acting as a Neurosurgeon for Jacquelyn Matt, MD.,have documented all relevant documentation on the behalf of Jacquelyn Matt, MD,as directed by  Jacquelyn Matt, MD while in the presence of Jacquelyn Matt, MD.  .I have reviewed the above documentation for accuracy and completeness, and I agree with the above. .Jamaiya Tunnell Kishore Remonia Otte MD

## 2024-01-11 NOTE — Patient Instructions (Signed)

## 2024-01-12 ENCOUNTER — Telehealth: Payer: Self-pay | Admitting: Hematology

## 2024-01-12 LAB — KAPPA/LAMBDA LIGHT CHAINS
Kappa free light chain: 121.9 mg/L — ABNORMAL HIGH (ref 3.3–19.4)
Kappa, lambda light chain ratio: 13.11 — ABNORMAL HIGH (ref 0.26–1.65)
Lambda free light chains: 9.3 mg/L (ref 5.7–26.3)

## 2024-01-12 NOTE — Telephone Encounter (Signed)
 Left patient a vm regarding upcoming appointment

## 2024-01-13 LAB — MULTIPLE MYELOMA PANEL, SERUM
Albumin SerPl Elph-Mcnc: 3.8 g/dL (ref 2.9–4.4)
Albumin/Glob SerPl: 1.7 (ref 0.7–1.7)
Alpha 1: 0.2 g/dL (ref 0.0–0.4)
Alpha2 Glob SerPl Elph-Mcnc: 0.5 g/dL (ref 0.4–1.0)
B-Globulin SerPl Elph-Mcnc: 0.8 g/dL (ref 0.7–1.3)
Gamma Glob SerPl Elph-Mcnc: 0.7 g/dL (ref 0.4–1.8)
Globulin, Total: 2.3 g/dL (ref 2.2–3.9)
IgA: 114 mg/dL (ref 61–437)
IgG (Immunoglobin G), Serum: 706 mg/dL (ref 603–1613)
IgM (Immunoglobulin M), Srm: 34 mg/dL (ref 15–143)
Total Protein ELP: 6.1 g/dL (ref 6.0–8.5)

## 2024-01-17 ENCOUNTER — Encounter: Payer: Self-pay | Admitting: Hematology

## 2024-01-24 ENCOUNTER — Other Ambulatory Visit: Payer: Self-pay | Admitting: Hematology

## 2024-01-24 DIAGNOSIS — C9000 Multiple myeloma not having achieved remission: Secondary | ICD-10-CM

## 2024-01-31 DIAGNOSIS — H2513 Age-related nuclear cataract, bilateral: Secondary | ICD-10-CM | POA: Diagnosis not present

## 2024-02-16 ENCOUNTER — Other Ambulatory Visit: Payer: Self-pay | Admitting: Hematology

## 2024-02-16 ENCOUNTER — Other Ambulatory Visit (HOSPITAL_COMMUNITY): Payer: Self-pay | Admitting: Cardiology

## 2024-02-21 ENCOUNTER — Other Ambulatory Visit: Payer: Self-pay | Admitting: Hematology

## 2024-02-21 DIAGNOSIS — C9000 Multiple myeloma not having achieved remission: Secondary | ICD-10-CM

## 2024-03-01 ENCOUNTER — Ambulatory Visit: Admitting: Internal Medicine

## 2024-03-01 ENCOUNTER — Telehealth (HOSPITAL_COMMUNITY): Payer: Self-pay | Admitting: Cardiology

## 2024-03-01 NOTE — Telephone Encounter (Signed)
 Patients wife called to report afib noted at 0200  Pt reports SOB with exertion HR 86-200 irr 99/60 b/p  Pt is very sensitive to afib so knew right away when HR was elevated, has been monitoring with home pulse ox and b/p cuff all AM  Medication compliant (No missed doses of eliquis  and metoprolol  taken this AM)  Please advise

## 2024-03-01 NOTE — Telephone Encounter (Signed)
 Please setup for nurse visit with EKG.    Thanks, Honestii Marton NP-C  3:50 PM

## 2024-03-02 ENCOUNTER — Ambulatory Visit (HOSPITAL_COMMUNITY): Payer: Self-pay | Admitting: Adult Health

## 2024-03-02 ENCOUNTER — Encounter (HOSPITAL_COMMUNITY): Payer: Self-pay

## 2024-03-02 ENCOUNTER — Ambulatory Visit (HOSPITAL_COMMUNITY)
Admission: RE | Admit: 2024-03-02 | Discharge: 2024-03-02 | Disposition: A | Discharge status: Home / Self Care | Source: Ambulatory Visit | Attending: Cardiology | Admitting: Cardiology

## 2024-03-02 ENCOUNTER — Other Ambulatory Visit (HOSPITAL_COMMUNITY): Payer: Self-pay | Admitting: *Deleted

## 2024-03-02 VITALS — BP 129/102 | HR 148 | Resp 20 | Wt 160.0 lb

## 2024-03-02 DIAGNOSIS — I452 Bifascicular block: Secondary | ICD-10-CM | POA: Diagnosis not present

## 2024-03-02 DIAGNOSIS — C9000 Multiple myeloma not having achieved remission: Secondary | ICD-10-CM | POA: Diagnosis not present

## 2024-03-02 DIAGNOSIS — I444 Left anterior fascicular block: Secondary | ICD-10-CM | POA: Diagnosis not present

## 2024-03-02 DIAGNOSIS — Q2112 Patent foramen ovale: Secondary | ICD-10-CM | POA: Diagnosis not present

## 2024-03-02 DIAGNOSIS — G629 Polyneuropathy, unspecified: Secondary | ICD-10-CM | POA: Insufficient documentation

## 2024-03-02 DIAGNOSIS — Z7961 Long term (current) use of immunomodulator: Secondary | ICD-10-CM | POA: Diagnosis not present

## 2024-03-02 DIAGNOSIS — I480 Paroxysmal atrial fibrillation: Secondary | ICD-10-CM | POA: Insufficient documentation

## 2024-03-02 DIAGNOSIS — Z79899 Other long term (current) drug therapy: Secondary | ICD-10-CM | POA: Insufficient documentation

## 2024-03-02 DIAGNOSIS — Q2381 Bicuspid aortic valve: Secondary | ICD-10-CM | POA: Insufficient documentation

## 2024-03-02 DIAGNOSIS — R7989 Other specified abnormal findings of blood chemistry: Secondary | ICD-10-CM | POA: Diagnosis not present

## 2024-03-02 DIAGNOSIS — Z7901 Long term (current) use of anticoagulants: Secondary | ICD-10-CM | POA: Insufficient documentation

## 2024-03-02 DIAGNOSIS — I110 Hypertensive heart disease with heart failure: Secondary | ICD-10-CM | POA: Insufficient documentation

## 2024-03-02 DIAGNOSIS — Z7982 Long term (current) use of aspirin: Secondary | ICD-10-CM | POA: Diagnosis not present

## 2024-03-02 DIAGNOSIS — I4891 Unspecified atrial fibrillation: Secondary | ICD-10-CM | POA: Diagnosis not present

## 2024-03-02 DIAGNOSIS — I493 Ventricular premature depolarization: Secondary | ICD-10-CM | POA: Diagnosis not present

## 2024-03-02 DIAGNOSIS — Z953 Presence of xenogenic heart valve: Secondary | ICD-10-CM | POA: Insufficient documentation

## 2024-03-02 DIAGNOSIS — I4819 Other persistent atrial fibrillation: Secondary | ICD-10-CM

## 2024-03-02 DIAGNOSIS — I350 Nonrheumatic aortic (valve) stenosis: Secondary | ICD-10-CM | POA: Insufficient documentation

## 2024-03-02 LAB — BASIC METABOLIC PANEL WITH GFR
Anion gap: 7 (ref 5–15)
BUN: 15 mg/dL (ref 8–23)
CO2: 28 mmol/L (ref 22–32)
Calcium: 9.2 mg/dL (ref 8.9–10.3)
Chloride: 104 mmol/L (ref 98–111)
Creatinine, Ser: 1.23 mg/dL (ref 0.61–1.24)
GFR, Estimated: 58 mL/min — ABNORMAL LOW (ref >60)
Glucose, Bld: 102 mg/dL — ABNORMAL HIGH (ref 70–99)
Potassium: 4.3 mmol/L (ref 3.5–5.1)
Sodium: 139 mmol/L (ref 135–145)

## 2024-03-02 LAB — CBC
HCT: 51.2 % (ref 39.0–52.0)
Hemoglobin: 16.7 g/dL (ref 13.0–17.0)
MCH: 30.6 pg (ref 26.0–34.0)
MCHC: 32.6 g/dL (ref 30.0–36.0)
MCV: 93.9 fL (ref 80.0–100.0)
Platelets: 112 10*3/uL — ABNORMAL LOW (ref 150–400)
RBC: 5.45 MIL/uL (ref 4.22–5.81)
RDW: 13.4 % (ref 11.5–15.5)
WBC: 3.2 10*3/uL — ABNORMAL LOW (ref 4.0–10.5)
nRBC: 0 % (ref 0.0–0.2)

## 2024-03-02 LAB — MAGNESIUM: Magnesium: 2.2 mg/dL (ref 1.7–2.4)

## 2024-03-02 MED ORDER — AMIODARONE HCL 200 MG PO TABS: 200.0000 mg | ORAL_TABLET | Freq: Every day | ORAL | 3 refills | Status: DC

## 2024-03-02 NOTE — Patient Instructions (Signed)
 Medication Changes:  START Amiodarone  200 mg Daily  Lab Work:  Labs done today, your results will be available in MyChart, we will contact you for abnormal readings.   Testing/Procedures:  Your physician has recommended that you have a Cardioversion (DCCV). Electrical Cardioversion uses a jolt of electricity to your heart either through paddles or wired patches attached to your chest. This is a controlled, usually prescheduled, procedure. Defibrillation is done under light anesthesia in the hospital, and you usually go home the day of the procedure. This is done to get your heart back into a normal rhythm. You are not awake for the procedure. Please see the instruction sheet given to you today.  Special Instructions // Education:  Do the following things EVERYDAY: Weigh yourself in the morning before breakfast. Write it down and keep it in a log. Take your medicines as prescribed Eat low salt foods--Limit salt (sodium) to 2000 mg per day.  Stay as active as you can everyday Limit all fluids for the day to less than 2 liters      CARDIOVERSION INSTRUCTIONS:  You are scheduled for a Cardioversion on Wednesday, June 25 with Dr. Alease Amend.    Please arrive at the [facility] (Main Entrance A) at [facility]: [address] [place], [place] [address] at 10:00 AM (This time is 1 hour(s) before your procedure to ensure your preparation).   Free valet parking service is available. You will check in at ADMITTING.   *Please Note: You will receive a call the day before your procedure to confirm the appointment time. That time may have changed from the original time based on the schedule for that day.*    DIET:  Nothing to eat or drink after midnight except a sip of water  with medications (see medication instructions below)  MEDICATION INSTRUCTIONS: !!IF ANY NEW MEDICATIONS ARE STARTED AFTER TODAY, PLEASE NOTIFY YOUR PROVIDER AS SOON AS POSSIBLE!!  FYI: Medications such as  Semaglutide (Ozempic, [place]), Tirzepatide (Mounjaro, Zepbound), Dulaglutide (Trulicity), etc (GLP1 agonists) AND Canagliflozin (Invokana), Dapagliflozin (Farxiga), Empagliflozin (Jardiance), Ertugliflozin (Steglatro), Bexagliflozin [employer]) or any combination with one of these drugs such as Invokamet (Canagliflozin/Metformin), Synjardy (Empagliflozin/Metformin), etc (SGLT2 inhibitors) must be held around the time of a procedure. This is not a comprehensive list of all of these drugs. Please review all of your medications and talk to your provider if you take any one of these. If you are not sure, ask your provider.   [facility] 6/25 AM DO NOT TAKE Spironolactone    Continue taking your anticoagulant (blood thinner): Apixaban  (Eliquis ).  You will need to continue this after your procedure until you are told by your provider that it is safe to stop.    LABS: DONE TODAY    FYI:  Your support person will be asked to wait in the waiting room during your procedure.  It is OK to have someone drop you off and come back when you are ready to be discharged.  You cannot drive after the procedure and will need someone to drive you home.  Bring your insurance cards.  *Special Note: Every effort is made to have your procedure done on time. Occasionally there are emergencies that occur at the hospital that may cause delays. Please be patient if a delay does occur.     Follow-Up in: 3 WEEKS   At the [facility], you and your health needs are our priority. We have a designated team specialized in the treatment of Heart Failure. This  Care Team includes your primary Heart Failure Specialized Cardiologist (physician), Advanced Practice Providers (APPs- Physician Assistants and Nurse Practitioners), and Pharmacist who all work together to provide you with the care you need, when you need it.   You may see any of the following providers on your designated Care Team at your next follow  up:  Dr. Jules Oar Dr. Peder Bourdon Dr. Alwin Baars Dr. Judyth Nunnery Nieves Bars, NP Ruddy Corral, [place] [facility] Shafer, [place] Dennise Fitz, NP [place] Lee, NP Luster Salters, PharmD   Please be sure to bring in all your medications bottles to every appointment.   Need to Contact Us :  If you have any questions or concerns before your next appointment please send us  a message through [place] or call our office at [phone].    TO LEAVE A MESSAGE FOR THE NURSE SELECT OPTION 2, PLEASE LEAVE A MESSAGE INCLUDING: YOUR NAME DATE OF BIRTH CALL BACK NUMBER REASON FOR CALL**this is important as we prioritize the call backs  YOU WILL RECEIVE A CALL BACK THE SAME DAY AS LONG AS YOU CALL BEFORE 4:00 PM

## 2024-03-02 NOTE — Progress Notes (Signed)
 Date:  03/02/2024  ID:  [name] [name], DOB [birth year], MRN [id number]  Provider location:  Advanced [facility] Type of Visit: Established patient   PCP:  [name], Oakley Bellman, MD  Cardiologist: Dr. Mitzie Anda  Chief Complaint: Palpitations    HPI: [name] [name] is a 83 y.o. male who has history of HTN, aortic stenosis and paroxysmal atrial fibrillation. He was hospitalized with atrial fibrillation/RVR in 5/14.  He had TEE-guided cardioversion.  TEE showed bicuspid aortic valve with mild AS and a moderately dilated ascending aorta.  Most echo in 4/17 showed moderate aortic stenosis and MRA chest in 11/17 showed 4.1 cm ascending aorta.    He was on Xarelto  for anticoagulation but stopped it as he was convinced it was causing joint pains.  He then refused to start any other anticoagulation at that time.     In 5/17, he had been back in atrial fibrillation for several days and was symptomatic.  I started him on Eliquis  and planned TEE-guided DCCV given significant symptoms, but he converted back to NSR on his own.  He continued Eliquis  for about 1 month then stopped it on his own.     In 11/17, he was hospitalized with symptomatic atrial fibrillation with RVR.  I started him on diltiazem  CD and Eliquis  with plan for TEE-guided DCCV.  However, he converted back to NSR on his own. He stopped the diltiazem  but has continued the Eliquis .    He was admitted in 2/18 with fever, LUL PNA and left-sided pleural effusion.  Thoracentesis on left was suggestive of parapneumonic effusion.  He was in the hospital 11 days.  During that time, he went into atrial fibrillation with RVR.  He was started on amiodarone  and went back into NSR.  Amiodarone  was subsequently stopped.    Recurrent atrial fibrillation with RVR in 2/19, felt more fatigued.  He underwent TEE-guided DCCV back to NSR.    He had a lower GI bleed in 11/19 post-polypectomy.  He has since restarted on Eliquis .    Coronary CTA  was done in 2/20, this showed mild nonobstructive CAD.  Echo in 2/20 showed EF 60-65%, bicuspid aortic valve with severe AS.   LHC in 6/20 showed no significant coronary disease.  In 8/20, he had TAVR with Edwards Sapien 3 THV x 2 (valve in valve due to peri-valvular leak initially).  Post-procedure echoes have shown elevated gradient across the aortic valve though dimensionless index has only been in the mildly stenotic range.  Last echo in 9/20 showed mean aortic valve gradient 26 mmHg with dimensionless index 0.54.   Patient has additionally been diagnosed with multiple myeloma.  He was treated with Revlimid , Velcade , and prednisone .   He was admitted early in 1/21 with atrial fibrillation/RVR associated with ischemic colitis.  He underwent TEE-guided DCCV to NSR.   He was readmitted later in 1/21 with PNA.    He fell in 4/21, tripped and fractured his left proximal humerus.  He had ORIF in 5/21.   In 7/21, he had a left inguinal hernia repair.  Echo in 8/21 showed EF 60-65%, normal RV, ascending aorta 45 mm, stable TAVR valve with mean gradient 15 mmHg.   Echo in 8/22 showed EF 60-65% RV ok.  Bioprosthetic AVR s/p TAVR with mean gradient 13 IVC small.   Echo in 9/23 showed EF 65-70% with normal RV, PASP 34, s/p TAVR with mean gradient up to 19 mmHg.  Gated CT was then done  to look at the aortic valve more closely, this showed 4.4 cm ascending aorta with small PFO, HALT at aortic valve leaflet bases without significant restriction, this was consistent with leaflet thrombosis.   Follow up 10/23, Dr. 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  He suggested transition to warfarin with INR 2.5-3.5, but he does not want to switch to warfarin.   He was in AF 08/01/22 and advised to start amiodarone  200 mg bid.  He had worsening SOB and CP and was seen in ED 08/06/22. ECG showed AF with controlled rate. Underwent DCCV with  restoration to NSR.  He stopped amiodarone  soon after that.   He remains on revlimid  for multiple myeloma.   Recently seen in the ED 12/26/23 for palpitations. Was in afib and cardioverted by EDP and sent home.   He presents today as an acute work in visit. Started having palpitations and elevated HR at home over the last 1-2 days. Could tell he was in afib. His pulse rates have been elevated at home, RVR. He has not noticed any bradycardia on home monitor. Denies dyspnea. No LEE. No syncope/ near syncope.   EKG today shows Afib 119 bpm. BP normotensive.    ECG (personally reviewed): Afib 119 bpm.   Labs (2/19): K 3.7, creatinine 0.83, hgb 16 Labs (11/19): LDL 82 Labs (1/20): TSH elevated but free T4 normal, K 4, creatinine 0.85, hgb 13.5 Labs (2/20): TSH mildly elevated but free T4 normal, K 4, creatinine 0.85 Labs (5/20): ESR 48 Labs (9/20): hgb 14.1, plts 38 => 82, K 3.3, creatinine 0.98 => 1.06 Labs (11/20): BNP 126.5 Labs (12/20): hgb 11.9, K 3.2, creatinine 0.87 Labs (1/21): K 4.8, creatinine 1.42, AST 30, ALT 60 Labs (4/21): K 4.5, creatinine 1.22, hgb 12.3, plts 122 Labs (7/21): hgb 13.7, K 4.6, creatinine 1.31, AST 36, ALT 76 Labs (3/22): K 3.7, creatinine 1.05, LFTs normal Labs (9/23): K 4.3, creatinine 1.16, hgb 14.5 Labs (11/23): K 4.1, creatinine 1.20 Labs (7/24): K 4.3, creatinine 1.43   Allergies (verified):  No Known Drug Allergies    Past Medical History:  1. Hypertension: ACEI cough. Edema with amlodipine .  2. Atrial fibrillation. The patient had new-onset atrial fibrillation in November 2009. He underwent ibutilide cardioversion successfully. He has had 1-2 episodes/year that are short-lived that likely are atrial fibrillation with RVR. He was admitted in 5/14 with atrial fibrillation/RVR and had TEE-guided DCCV.  Atrial fibrillation again in 5/17, converted back to NSR spontaneously.  CHADSVASC score 2.  - Atrial fibrillation 2/19 with TEE-guided DCCV.  - 1/21  TEE-guided DCCV 3. Hypercholesterolemia.  4. Bicuspid aortic valve disorder: Echo (7/11): EF 55-60%, mild LV hypertrophy, mild aortic stenosis with mean gradient 19 mmHg and peak gradient 36 mmHg.  TEE (5/14): EF 55%, mild LVH, bicuspid aortic valve with mild AS (mean gradient 13 mmHg), ascending aorta 4.5 cm.  Echo (9/15) with EF 65-70%, moderate AS (mean gradient 23 mmHg), mild AI, ascending aorta 4.1 cm, mild MR.   - Echo (4/17) with EF 65-70%, moderate aortic stenosis with mean gradient 27 mmHg, PASP 31 mmHg, ascending aorta 4.4 cm.  - Echo (2/18) with EF 55-60%, bicuspid aortic valve with moderate aortic stenosis (underestimated gradient).  - TEE (2/19): EF 60-65%, moderate LVH, bicuspid aortic valve with moderate AS with mean gradient 28 mmHg and AVA 1.2 cm^2, 4.4 cm ascending aorta.  - Echo (2/20): EF 60-65%, mild LVH, bicuspid aortic valve  with severe AS (mean gradient 48 mmHg, AVA 0.8 cm^2).  - TAVR 8/20 with valve in valve Edwards Sapien 3 THVs (because of peri-valvular leak after initial valve placed).  - Echo (9/20): EF > 65%, mild LVH, mild RV dilation with normal RV systolic function, mean aortic valve gradient 26 mmHg with dimensionless index 0.54, IVC normal.  - TEE (1/21): EF 60-65%, moderate LVH, normal RV, s/p valve-in-valve TAVR with mean gradient 8 mmHg and no PVL.  - Echo (8/21): EF 60-65%, normal RV, ascending aorta 45 mm, stable TAVR valve with mean gradient 15 mmHg.  - Echo (8/22): EF 70-75%, s/p TAVR mean gradient 12 mmHg.  - Echo (9/23): EF 65-70% with normal RV, PASP 34, s/p TAVR with mean gradient up to 19 mmHg. - Gated CT (9/23) showed 4.4 cm ascending aorta with small PFO, HALT at aortic valve leaflet bases without significant restriction, this was consistent with leaflet thrombosis. 5. Osteoarthritis.  6. Low back pain.  7. Chest pain: ETT-myoview (12/11) with 10:51 exercise, no chest pain, no significant ST changes, EF 69%, no evidence for ischemia or infarction.  -  Coronary CTA (2/20): calcium  score 41, nonobstructive CAD.  - LHC (6/20): No significant CAD.  8. Ascending aortic aneurysm: Associated with bicuspid aortic valve.  4.5 cm by TEE in 5/14.   MRA chest (6/14) with bicuspid aortic valve, 4.3 cm ascending aortic aneurysm.  MRA chest (10/15) with 4.2 cm ascending aorta (bicuspid aortic valve noted).  Echo (4/17) with ascending aorta diameter 4.4 cm.  - MRA chest (11/17) with 4.1 cm ascending aorta.  - Echo (2/19): 4.4 cm ascending aorta.  - Coronary CTA in 2/20 showed 4.3 cm ascending aorta.  - Echo (8/21) with 4.5 cm ascending aorta.  - Gated CT chest (9/23): 4.4 cm ascending aorta 9. Colonic polyps: GI bleeding in 11/19 s/p polypectomy.  10. Multiple myeloma.  11. Bradycardia 12. Thrombocytopenia: likely related to chemotherapy for multiple myeloma.  13. PVCs: Zio monitor in 10/20 showed 9.4% PVCs.  14. Chronic diastolic CHF: RHC (12/20) with mean RA 3, PA 22/2, mean PCWP 6, CI 2.74 15. Left inguinal hernia: s/p repair.    Current Outpatient Medications  Medication Sig Dispense Refill   amiodarone  (PACERONE ) 200 MG tablet Take 1 tablet (200 mg total) by mouth daily. 30 tablet 3   aspirin  81 MG chewable tablet CHEW 1 TABLET BY MOUTH DAILY. 90 tablet 1   Calcium -Magnesium  500-250 MG TABS Take 1 tablet by mouth daily. 30 tablet 6   Carboxymethylcellul-Glycerin (LUBRICATING EYE DROPS OP) Place 1 drop into both eyes daily as needed (dry eyes).     Cholecalciferol  (VITAMIN D ) 50 MCG (2000 UT) tablet Take 2,000 Units by mouth daily.     diclofenac  Sodium (VOLTAREN ) 1 % GEL Apply 1 application topically 2 (two) times daily as needed (pain.).     ELIQUIS  5 MG TABS tablet TAKE 1 TABLET (5 MG TOTAL) BY MOUTH 2 (TWO) TIMES DAILY. NEEDS FOLLOW UP APPOINTMENT FORE MORE REFILLS 180 tablet 0   gabapentin  (NEURONTIN ) 300 MG capsule TAKE 1 CAPSULE BY MOUTH EVERYDAY AT BEDTIME 90 capsule 3   ipratropium (ATROVENT ) 0.06 % nasal spray Place 2 sprays into the nose  3 (three) times daily. (Patient taking differently: Place 2 sprays into the nose once a week.) 15 mL 2   lenalidomide  (REVLIMID ) 5 MG capsule TAKE 1 CAPSULE BY MOUTH 1 TIME A DAY FOR 21 DAYS ON THEN 7 DAYS OFF 21 capsule 0   Magnesium  Oxide -  Mg Supplement 250 MG TABS Take 250 mg by mouth daily.     metoprolol  succinate (TOPROL -XL) 25 MG 24 hr tablet TAKE 1 TABLET (25 MG TOTAL) BY MOUTH AT BEDTIME 90 tablet 3   metroNIDAZOLE  (METROCREAM ) 0.75 % cream Apply 1 application  topically 2 (two) times daily as needed (rash).     oxyCODONE  (OXY IR/ROXICODONE ) 5 MG immediate release tablet Take 1-2 tablets (5-10 mg total) by mouth 2 (two) times daily as needed for severe pain (pain score 7-10). 120 tablet 0   senna-docusate (SENOKOT-S) 8.6-50 MG tablet Take 2 tablets by mouth daily as needed for mild constipation or moderate constipation.     spironolactone  (ALDACTONE ) 25 MG tablet TAKE 1 TABLET BY MOUTH EVERY DAY 90 tablet 3   Vitamin D , Ergocalciferol , (DRISDOL ) 1.25 MG (50000 UNIT) CAPS capsule TAKE 1 CAPSULE BY MOUTH ONE TIME PER WEEK 12 capsule 3   No current facility-administered medications for this encounter.   Allergies:  Xarelto  [rivaroxaban ], Corticosteroids, Ramipril , Zolpidem, and Benazepril    Social History:  The patient  reports that he has never smoked. He has never used smokeless tobacco. He reports that he does not currently use alcohol. He reports that he does not use drugs.   Family History:  The patient's family history includes Colon cancer (age of onset: 64) in his mother; Hypertension in an other family member.   ROS:  Please see the history of present illness.   All other systems are personally reviewed and negative.   Physical Exam:   BP (!) 129/102   Pulse (!) 148 Comment: iirregular  Resp 20   Wt 72.6 kg (160 lb)   SpO2 99%   BMI 23.63 kg/m  General:  Well appearing, elderly. No respiratory difficulty HEENT: normal Neck: supple. no JVD. Carotids 2+ bilat; no bruits. No  lymphadenopathy or thyromegaly appreciated. Cor: PMI nondisplaced. Irregularly irregular rate and rhythm. No rubs, gallops or murmurs. Lungs: clear Abdomen: soft, nontender, nondistended. No hepatosplenomegaly. No bruits or masses. Good bowel sounds. Extremities: no cyanosis, clubbing, rash, edema Neuro: alert & oriented x 3, cranial nerves grossly intact. moves all 4 extremities w/o difficulty. Affect pleasant.   Recent Labs: 06/08/2023: B Natriuretic Peptide 61.4 01/11/2024: ALT 27 03/02/2024: BUN 15; Creatinine, Ser 1.23; Hemoglobin 16.7; Magnesium  2.2; Platelets 112; Potassium 4.3; Sodium 139  Personally reviewed   Wt Readings from Last 3 Encounters:  03/02/24 72.6 kg (160 lb)  01/11/24 77 kg (169 lb 11.2 oz)  12/26/23 76.2 kg (168 lb)    ASSESSMENT AND PLAN: 1. Atrial fibrillation: Paroxysmal. He is quite symptomatic when in atrial fibrillation.  CHADSVASC = 3. Required DCCV 11/23 in ED and most recently 4/25. Previously on amiodarone  but this was stopped due to bradycardia. He is back in Afib on EKG today. He is symptomatic w/ palpitations. V rates in the 110s. BP normotensive. He has not missed any doses of Eliquis  in the last 30 days.  - will restart amiodarone , just 200 mg once daily to help w/ rate control and will arrange outpatient DCCV. I also recommended placing a Zio to monitor for bradycardia given his history but pt refused.  - I also recommended EP referral for assessment for possible AF ablation or possible PPM given tachy/brady syndrome to allow better tolerance of AAD options, but he is clear he would not want any other procedures besides cardioversion - he will monitor pulse rate at home and will alert us  if any significant bradycardia  - continue Eliquis   -  check BMP, Mg level and CBC today  - Continue Toprol  XL 25 mg bid.  2. Peripheral neuropathy: Patient developed a very painful peripheral neuropathy on Velcade .  This is still very symptomatic.  3. Bradycardia:  After TAVR, patient had RBBB and LAFB with HR down to the 40s.  RBBB has resolved. Has tolerated low dose Toprol  w/o any significant recurrent bradycardia  - starting low dose amio per above for recurrent afib. He declined Zio for monitoring. He will monitor HR at hom 4. PVCs: Zio monitor in 10/20 showed 9.4% PVCs.  No PVCs noted on today's EKG 5. HTN:  controlled on current regimen, continue - check BMP today  6. Bicuspid aortic valve disorder: S/P TAVR with 2 Edwards Sapien THVs (valve-in-valve due to peri-valvular leak after 1st valve placed). Post-op, mean gradient across the aortic valve was elevated, 26 mmHg in 9/20.  Dimensionless index, in 9/20 0.54, only suggests mild bioprosthetic aortic stenosis.  Echo in 1/21 showed mean gradient down to 10 mmHg and TEE showed mean gradient 8 mmHg with no peri-valvular leakage.  Echo in 8/22 with mean gradient 12 mmHg.  Echo in 9/23 showed increase mean gradient up to 19 mmHg across bioprosthetic aortic valve.  Gated CT chest showed HALT at leaflet bases without restriction suggestive of leaflet thrombosis.  - When this was found, it was recommended that he transition from apixaban  to warfarin with INR 2.5-3.5. He did not want to take warfarin given need for monitoring and restriction on diet. Therefore, he will continue ASA 81 mg daily + Eliquis .  - Echo 11/24 showed stable TAVR valve   7. Ascending aortic aneurysm: CT chest in 9/23 showed 4.4 cm ascending aorta. Echo 11/24 noted aortic root is normal in size and structure.  - Follow aorta by echo for now, he would likely be a poor candidate for ascending aorta replacement given frailty and age.  8. Chronic diastolic CHF:  NYHA Class II, confounded by age and recurrent Afib. Euvolemic on exam - does not need loop diuretic currently  9. Multiple myeloma: He continues on Revlimid .     Plan outpatient DCCV next wk. F/u w/ APP w/ EKG 2 wks post procedure.   Signed, Ruddy Corral, PA-C   03/02/2024  [facility] [address] [facility] [place] [address] [phone] (office) [phone] (fax)

## 2024-03-06 NOTE — Progress Notes (Signed)
 Called pt for pre op instructions for cardioversion.  Wife states he has converted to NSR and that they have called Dr Orvilla office.  Advised to remain NPO after midnight and take Eliquis  in am if they believe they need to keep appointment.

## 2024-03-07 ENCOUNTER — Ambulatory Visit (HOSPITAL_COMMUNITY)
Admission: RE | Admit: 2024-03-07 | Discharge: 2024-03-07 | Disposition: A | Discharge status: Home / Self Care | Source: Ambulatory Visit | Attending: Internal Medicine | Admitting: Internal Medicine

## 2024-03-07 ENCOUNTER — Encounter (HOSPITAL_COMMUNITY): Payer: Self-pay | Admitting: Anesthesiology

## 2024-03-07 ENCOUNTER — Encounter (HOSPITAL_COMMUNITY): Admission: RE | Payer: Self-pay | Source: Home / Self Care

## 2024-03-07 ENCOUNTER — Ambulatory Visit (HOSPITAL_COMMUNITY): Admission: RE | Admit: 2024-03-07 | Source: Home / Self Care | Admitting: Cardiology

## 2024-03-07 VITALS — BP 123/64 | HR 51

## 2024-03-07 DIAGNOSIS — I480 Paroxysmal atrial fibrillation: Secondary | ICD-10-CM | POA: Diagnosis present

## 2024-03-07 DIAGNOSIS — I4819 Other persistent atrial fibrillation: Secondary | ICD-10-CM | POA: Insufficient documentation

## 2024-03-07 DIAGNOSIS — I444 Left anterior fascicular block: Secondary | ICD-10-CM | POA: Diagnosis not present

## 2024-03-07 DIAGNOSIS — Z7901 Long term (current) use of anticoagulants: Secondary | ICD-10-CM | POA: Diagnosis not present

## 2024-03-07 DIAGNOSIS — I440 Atrioventricular block, first degree: Secondary | ICD-10-CM | POA: Insufficient documentation

## 2024-03-07 SURGERY — CARDIOVERSION (CATH LAB)
Anesthesia: General

## 2024-03-07 NOTE — Addendum Note (Signed)
 Encounter addended by: Jerona Dalton HERO, CMA on: 03/07/2024 10:11 AM  Actions taken: Vitals modified

## 2024-03-07 NOTE — Anesthesia Preprocedure Evaluation (Signed)
 Anesthesia Evaluation  Patient identified by MRN, date of birth, ID band Patient awake    Reviewed: Allergy & Precautions, NPO status , Patient's Chart, lab work & pertinent test results  Airway       Comment: Previous grade I view with Miller 3, easy mask Dental   Pulmonary           Cardiovascular hypertension (metoprolol ), Pt. on home beta blockers +CHF  + dysrhythmias Atrial Fibrillation + Valvular Problems/Murmurs (bicuspid aortic valve, s/p TAVR 04/2019) AS   Ascending aortic aneurysm, HLD  TTE 07/20/2023: IMPRESSIONS    1. Left ventricular ejection fraction, by estimation, is 65 to 70%. The  left ventricle has normal function. The left ventricle has no regional  wall motion abnormalities. Left ventricular diastolic parameters are  consistent with Grade I diastolic  dysfunction (impaired relaxation).   2. Right ventricular systolic function is normal. The right ventricular  size is normal.   3. The mitral valve is normal in structure. Mild mitral valve  regurgitation. No evidence of mitral stenosis. Moderate mitral annular  calcification.   4. The aortic valve has been repaired/replaced. Aortic valve  regurgitation is not visualized. No aortic stenosis is present. There is a  26 mm Edwards valve present in the aortic position. Procedure Date:  05/08/19. Echo findings are consistent with normal   structure and function of the aortic valve prosthesis. Aortic valve area,  by VTI measures 1.74 cm. Aortic valve mean gradient measures 13.0 mmHg.  Aortic valve Vmax measures 2.37 m/s.   5. The inferior vena cava is normal in size with greater than 50%  respiratory variability, suggesting right atrial pressure of 3 mmHg.     Neuro/Psych  Headaches PSYCHIATRIC DISORDERS Anxiety Depression     Neuromuscular disease (occipital neuralgia)    GI/Hepatic ,GERD  ,,Fatty liver   Endo/Other    Renal/GU       Musculoskeletal  (+) Arthritis , Osteoarthritis,    Abdominal   Peds  Hematology  (+) Blood dyscrasia (multiple myeloma) Lab Results      Component                Value               Date                      WBC                      3.2 (L)             03/02/2024                HGB                      16.7                03/02/2024                HCT                      51.2                03/02/2024                MCV                      93.9  03/02/2024                PLT                      112 (L)             03/02/2024              Anesthesia Other Findings Last Eliquis :  Reproductive/Obstetrics                              Anesthesia Physical Anesthesia Plan  ASA: 3  Anesthesia Plan: General   Post-op Pain Management: Minimal or no pain anticipated   Induction: Intravenous  PONV Risk Score and Plan: 2 and TIVA and Treatment may vary due to age or medical condition  Airway Management Planned: Natural Airway and Nasal Cannula  Additional Equipment:   Intra-op Plan:   Post-operative Plan:   Informed Consent:      Dental advisory given  Plan Discussed with: CRNA and Anesthesiologist  Anesthesia Plan Comments: (Risks of general anesthesia discussed including, but not limited to, sore throat, hoarse voice, chipped/damaged teeth, injury to vocal cords, nausea and vomiting, allergic reactions, lung infection, heart attack, stroke, and death. All questions answered. )         Anesthesia Quick Evaluation

## 2024-03-07 NOTE — Progress Notes (Signed)
 Pt presents to office for EKG Pt reports normal rhythm/low HR with home equipment   EKG-sinus brady  Ok to cancel per Powell Buell PEAK   Message to provider as FYI/notification   Procedure cancelled with Kendal in scheduling

## 2024-03-13 ENCOUNTER — Encounter: Payer: Self-pay | Admitting: Internal Medicine

## 2024-03-13 ENCOUNTER — Ambulatory Visit (INDEPENDENT_AMBULATORY_CARE_PROVIDER_SITE_OTHER): Admitting: Internal Medicine

## 2024-03-13 VITALS — BP 118/62 | HR 57 | Temp 98.2°F | Ht 69.0 in | Wt 161.0 lb

## 2024-03-13 DIAGNOSIS — G8929 Other chronic pain: Secondary | ICD-10-CM

## 2024-03-13 DIAGNOSIS — F418 Other specified anxiety disorders: Secondary | ICD-10-CM

## 2024-03-13 DIAGNOSIS — M545 Low back pain, unspecified: Secondary | ICD-10-CM | POA: Diagnosis not present

## 2024-03-13 DIAGNOSIS — I480 Paroxysmal atrial fibrillation: Secondary | ICD-10-CM

## 2024-03-13 DIAGNOSIS — I4891 Unspecified atrial fibrillation: Secondary | ICD-10-CM | POA: Diagnosis not present

## 2024-03-13 DIAGNOSIS — I10 Essential (primary) hypertension: Secondary | ICD-10-CM

## 2024-03-13 MED ORDER — OXYCODONE HCL 5 MG PO TABS: 5.0000 mg | ORAL_TABLET | Freq: Two times a day (BID) | ORAL | 0 refills | Status: DC | PRN

## 2024-03-13 NOTE — Assessment & Plan Note (Signed)
On  ASA, Eliquis

## 2024-03-13 NOTE — Assessment & Plan Note (Signed)
On Metoprolol, spironolactone

## 2024-03-13 NOTE — Assessment & Plan Note (Signed)
On Metoprolol, Eliquis 

## 2024-03-13 NOTE — Assessment & Plan Note (Signed)
Chronic Oxycodone prn  Potential benefits of a long term opioids use as well as potential risks (i.e. addiction risk, apnea etc) and complications (i.e. Somnolence, constipation and others) were explained to the patient and were aknowledged.

## 2024-03-13 NOTE — Progress Notes (Signed)
 Subjective:  Patient ID: [name] [name], male    DOB: 10-25-1940  Age: 83 y.o. MRN: [id number]  CC: Medical Management of Chronic Issues (3 mnth f/u)   HPI [name] [name] presents for MM pains, wt loss, bone pains, A fib  Outpatient Medications Prior to Visit  Medication Sig Dispense Refill   amiodarone  (PACERONE ) 200 MG tablet Take 1 tablet (200 mg total) by mouth daily. 30 tablet 3   aspirin  81 MG chewable tablet CHEW 1 TABLET BY MOUTH DAILY. 90 tablet 1   Carboxymethylcellul-Glycerin (LUBRICATING EYE DROPS OP) Place 1 drop into both eyes daily as needed (dry eyes).     Cholecalciferol  (VITAMIN D ) 50 MCG (2000 UT) tablet Take 2,000 Units by mouth daily.     diclofenac  Sodium (VOLTAREN ) 1 % GEL Apply 1 application topically 2 (two) times daily as needed (pain.).     ELIQUIS  5 MG TABS tablet TAKE 1 TABLET (5 MG TOTAL) BY MOUTH 2 (TWO) TIMES DAILY. NEEDS FOLLOW UP APPOINTMENT FORE MORE REFILLS 180 tablet 0   gabapentin  (NEURONTIN ) 300 MG capsule TAKE 1 CAPSULE BY MOUTH EVERYDAY AT BEDTIME 90 capsule 3   ipratropium (ATROVENT ) 0.06 % nasal spray Place 2 sprays into the nose 3 (three) times daily. 15 mL 2   lenalidomide  (REVLIMID ) 5 MG capsule TAKE 1 CAPSULE BY MOUTH 1 TIME A DAY FOR 21 DAYS ON THEN 7 DAYS OFF 21 capsule 0   Magnesium  Oxide -Mg Supplement 250 MG TABS Take 250 mg by mouth daily.     metoprolol  succinate (TOPROL -XL) 25 MG 24 hr tablet TAKE 1 TABLET (25 MG TOTAL) BY MOUTH AT BEDTIME 90 tablet 3   metroNIDAZOLE  (METROCREAM ) 0.75 % cream Apply 1 application  topically 2 (two) times daily as needed (rash).     senna-docusate (SENOKOT-S) 8.6-50 MG tablet Take 2 tablets by mouth daily as needed for mild constipation or moderate constipation.     spironolactone  (ALDACTONE ) 25 MG tablet TAKE 1 TABLET BY MOUTH EVERY DAY 90 tablet 3   Vitamin D , Ergocalciferol , (DRISDOL ) 1.25 MG (50000 UNIT) CAPS capsule TAKE 1 CAPSULE BY MOUTH ONE TIME PER WEEK 12 capsule 3   oxyCODONE  (OXY  IR/ROXICODONE ) 5 MG immediate release tablet Take 1-2 tablets (5-10 mg total) by mouth 2 (two) times daily as needed for severe pain (pain score 7-10). 120 tablet 0   No facility-administered medications prior to visit.    ROS: Review of Systems  Constitutional:  Negative for appetite change, fatigue and unexpected weight change.  HENT:  Negative for congestion, nosebleeds, sneezing, sore throat and trouble swallowing.   Eyes:  Negative for itching and visual disturbance.  Respiratory:  Negative for cough.   Cardiovascular:  Negative for chest pain, palpitations and leg swelling.  Gastrointestinal:  Negative for abdominal distention, blood in stool, diarrhea and nausea.  Genitourinary:  Negative for frequency and hematuria.  Musculoskeletal:  Positive for arthralgias, back pain and gait problem. Negative for joint swelling and neck pain.  Skin:  Negative for rash.  Neurological:  Negative for dizziness, tremors, speech difficulty and weakness.  Psychiatric/Behavioral:  Negative for agitation, dysphoric mood, sleep disturbance and suicidal ideas. The patient is not nervous/anxious.     Objective:  BP 118/62   Pulse (!) 57   Temp 98.2 F (36.8 C) (Oral)   Ht 5' 9 (1.753 m)   Wt 161 lb (73 kg)   SpO2 97%   BMI 23.78 kg/m   BP Readings from Last 3 Encounters:  03/13/24 118/62  03/07/24 123/64  03/02/24 (!) 129/102    Wt Readings from Last 3 Encounters:  03/13/24 161 lb (73 kg)  03/02/24 160 lb (72.6 kg)  01/11/24 169 lb 11.2 oz (77 kg)    Physical Exam Constitutional:      General: He is not in acute distress.    Appearance: He is well-developed. He is ill-appearing. He is not toxic-appearing.     Comments: NAD   Eyes:     Conjunctiva/sclera: Conjunctivae normal.     Pupils: Pupils are equal, round, and reactive to light.   Neck:     Thyroid : No thyromegaly.     Vascular: No JVD.   Cardiovascular:     Rate and Rhythm: Normal rate and regular rhythm.     Heart  sounds: Normal heart sounds. No murmur heard.    No friction rub. No gallop.  Pulmonary:     Effort: Pulmonary effort is normal. No respiratory distress.     Breath sounds: Normal breath sounds. No wheezing or rales.  Chest:     Chest wall: No tenderness.  Abdominal:     General: Bowel sounds are normal. There is no distension.     Palpations: Abdomen is soft. There is no mass.     Tenderness: There is no abdominal tenderness. There is no guarding or rebound.   Musculoskeletal:        General: Tenderness present. Normal range of motion.     Cervical back: Normal range of motion.     Right lower leg: Edema present.     Left lower leg: Edema present.  Lymphadenopathy:     Cervical: No cervical adenopathy.   Skin:    General: Skin is warm and dry.     Findings: No rash.   Neurological:     Mental Status: He is alert and oriented to person, place, and time.     Cranial Nerves: No cranial nerve deficit.     Motor: No abnormal muscle tone.     Coordination: Coordination abnormal.     Gait: Gait abnormal.     Deep Tendon Reflexes: Reflexes are normal and symmetric.   Psychiatric:        Behavior: Behavior normal.        Thought Content: Thought content normal.        Judgment: Judgment normal.   Legs - trace edema Using a cane LS w/pain  Lab Results  Component Value Date   WBC 3.2 (L) 03/02/2024   HGB 16.7 03/02/2024   HCT 51.2 03/02/2024   PLT 112 (L) 03/02/2024   GLUCOSE 102 (H) 03/02/2024   CHOL 144 08/01/2018   TRIG 167.0 (H) 08/01/2018   HDL 28.20 (L) 08/01/2018   LDLDIRECT 151.5 11/20/2007   LDLCALC 82 08/01/2018   ALT 27 01/11/2024   AST 23 01/11/2024   NA 139 03/02/2024   K 4.3 03/02/2024   CL 104 03/02/2024   CREATININE 1.23 03/02/2024   BUN 15 03/02/2024   CO2 28 03/02/2024   TSH 5.338 (H) 06/23/2020   PSA 4.20 (H) 08/01/2018   INR 1.5 (H) 11/13/2022   HGBA1C 5.8 (H) 05/04/2019    No results found.  Assessment & Plan:   Problem List Items  Addressed This Visit     Essential hypertension   On Metoprolol , spironolactone       PAF (paroxysmal atrial fibrillation) (HCC)   On ASA, Eliquis       Back pain - Primary   Chronic Oxycodone  prn  Potential  benefits of a long term opioids use as well as potential risks (i.e. addiction risk, apnea etc) and complications (i.e. Somnolence, constipation and others) were explained to the patient and were aknowledged.       Relevant Medications   oxyCODONE  (OXY IR/ROXICODONE ) 5 MG immediate release tablet   Atrial fibrillation with RVR (HCC)   On Metoprolol , Eliquis       Depression with anxiety   Coping well         Meds ordered this encounter  Medications   oxyCODONE  (OXY IR/ROXICODONE ) 5 MG immediate release tablet    Sig: Take 1-2 tablets (5-10 mg total) by mouth 2 (two) times daily as needed for severe pain (pain score 7-10).    Dispense:  120 tablet    Refill:  0    Code: M54.9      Follow-up: Return in about 3 months (around 06/13/2024) for a follow-up visit.  Marolyn Noel, MD

## 2024-03-13 NOTE — Assessment & Plan Note (Signed)
Coping well

## 2024-03-20 ENCOUNTER — Other Ambulatory Visit: Payer: Self-pay | Admitting: Hematology

## 2024-03-20 DIAGNOSIS — C9000 Multiple myeloma not having achieved remission: Secondary | ICD-10-CM

## 2024-03-21 ENCOUNTER — Encounter (HOSPITAL_COMMUNITY): Payer: Self-pay | Admitting: Cardiology

## 2024-03-21 ENCOUNTER — Ambulatory Visit (HOSPITAL_COMMUNITY)
Admission: RE | Admit: 2024-03-21 | Discharge: 2024-03-21 | Disposition: A | Discharge status: Home / Self Care | Source: Ambulatory Visit | Attending: Cardiology | Admitting: Cardiology

## 2024-03-21 VITALS — BP 146/60 | HR 66 | Ht 69.0 in | Wt 168.8 lb

## 2024-03-21 DIAGNOSIS — Z953 Presence of xenogenic heart valve: Secondary | ICD-10-CM | POA: Diagnosis not present

## 2024-03-21 DIAGNOSIS — I493 Ventricular premature depolarization: Secondary | ICD-10-CM | POA: Diagnosis not present

## 2024-03-21 DIAGNOSIS — I444 Left anterior fascicular block: Secondary | ICD-10-CM | POA: Insufficient documentation

## 2024-03-21 DIAGNOSIS — Z7982 Long term (current) use of aspirin: Secondary | ICD-10-CM | POA: Diagnosis not present

## 2024-03-21 DIAGNOSIS — G629 Polyneuropathy, unspecified: Secondary | ICD-10-CM | POA: Insufficient documentation

## 2024-03-21 DIAGNOSIS — I110 Hypertensive heart disease with heart failure: Secondary | ICD-10-CM | POA: Diagnosis not present

## 2024-03-21 DIAGNOSIS — Z79899 Other long term (current) drug therapy: Secondary | ICD-10-CM | POA: Diagnosis not present

## 2024-03-21 DIAGNOSIS — Z7961 Long term (current) use of immunomodulator: Secondary | ICD-10-CM | POA: Diagnosis not present

## 2024-03-21 DIAGNOSIS — I2510 Atherosclerotic heart disease of native coronary artery without angina pectoris: Secondary | ICD-10-CM | POA: Insufficient documentation

## 2024-03-21 DIAGNOSIS — Z7901 Long term (current) use of anticoagulants: Secondary | ICD-10-CM | POA: Insufficient documentation

## 2024-03-21 DIAGNOSIS — I480 Paroxysmal atrial fibrillation: Secondary | ICD-10-CM | POA: Insufficient documentation

## 2024-03-21 DIAGNOSIS — I5032 Chronic diastolic (congestive) heart failure: Secondary | ICD-10-CM | POA: Insufficient documentation

## 2024-03-21 DIAGNOSIS — C9000 Multiple myeloma not having achieved remission: Secondary | ICD-10-CM | POA: Insufficient documentation

## 2024-03-21 MED ORDER — AMIODARONE HCL 200 MG PO TABS
200.0000 mg | ORAL_TABLET | Freq: Every day | ORAL | 3 refills | Status: DC
Start: 2024-03-21 — End: 2024-07-18

## 2024-03-21 NOTE — Patient Instructions (Signed)
 Medication Changes:  RESTART AMIODARONE  200MG  ONCE DAILY   Lab Work:  Engineer, technical sales FOR LABS IN 2 WEEKS AS SCHEDULED   Follow-Up in: 3 MONTHS AS SCHEDULED WITH APP   At the [facility], you and your health needs are our priority. We have a designated team specialized in the treatment of Heart Failure. This Care Team includes your primary Heart Failure Specialized Cardiologist (physician), Advanced Practice Providers (APPs- Physician Assistants and Nurse Practitioners), and Pharmacist who all work together to provide you with the care you need, when you need it.   You may see any of the following providers on your designated Care Team at your next follow up:  Dr. Toribio Fuel Dr. Ezra Shuck Dr. Ria Commander Dr. Odis Brownie Greig Mosses, NP Caffie Shed, [place] [facility] [place], [place] Beckey Coe, NP [place] Lee, NP Tinnie Redman, PharmD   Please be sure to bring in all your medications bottles to every appointment.   Need to Contact Us :  If you have any questions or concerns before your next appointment please send us  a message through [place] or call our office at [phone].    TO LEAVE A MESSAGE FOR THE NURSE SELECT OPTION 2, PLEASE LEAVE A MESSAGE INCLUDING: YOUR NAME DATE OF BIRTH CALL BACK NUMBER REASON FOR CALL**this is important as we prioritize the call backs  YOU WILL RECEIVE A CALL BACK THE SAME DAY AS LONG AS YOU CALL BEFORE 4:00 PM

## 2024-03-22 NOTE — Progress Notes (Signed)
 Date:  03/22/2024  ID:  [name] [name], DOB 1941-02-14, MRN [id number]  Provider location: [place] [facility] Type of Visit: Established patient   PCP:  [name], Karlynn GAILS, MD  Cardiologist: Dr. Rolan  Chief Complaint: Atrial fibrillation   HPI: [name] [name] is a 83 y.o. male who has history of HTN, aortic stenosis and paroxysmal atrial fibrillation. He was hospitalized with atrial fibrillation/RVR in 5/14.  He had TEE-guided cardioversion.  TEE showed bicuspid aortic valve with mild AS and a moderately dilated ascending aorta.  Most echo in 4/17 showed moderate aortic stenosis and MRA chest in 11/17 showed 4.1 cm ascending aorta.    He was on Xarelto  for anticoagulation but stopped it as he was convinced it was causing joint pains.  He then refused to start any other anticoagulation at that time.     In 5/17, he had been back in atrial fibrillation for several days and was symptomatic.  I started him on Eliquis  and planned TEE-guided DCCV given significant symptoms, but he converted back to NSR on his own.  He continued Eliquis  for about 1 month then stopped it on his own.     In 11/17, he was hospitalized with symptomatic atrial fibrillation with RVR.  I started him on diltiazem  CD and Eliquis  with plan for TEE-guided DCCV.  However, he converted back to NSR on his own. He stopped the diltiazem  but has continued the Eliquis .    He was admitted in 2/18 with fever, LUL PNA and left-sided pleural effusion.  Thoracentesis on left was suggestive of parapneumonic effusion.  He was in the hospital 11 days.  During that time, he went into atrial fibrillation with RVR.  He was started on amiodarone  and went back into NSR.  Amiodarone  was subsequently stopped.    Recurrent atrial fibrillation with RVR in 2/19, felt more fatigued.  He underwent TEE-guided DCCV back to NSR.    He had a lower GI bleed in 11/19 post-polypectomy.  He has since restarted on Eliquis .    Coronary  CTA was done in 2/20, this showed mild nonobstructive CAD.  Echo in 2/20 showed EF 60-65%, bicuspid aortic valve with severe AS.   LHC in 6/20 showed no significant coronary disease.  In 8/20, he had TAVR with Edwards Sapien 3 THV x 2 (valve in valve due to peri-valvular leak initially).  Post-procedure echoes have shown elevated gradient across the aortic valve though dimensionless index has only been in the mildly stenotic range.  Last echo in 9/20 showed mean aortic valve gradient 26 mmHg with dimensionless index 0.54.   Patient has additionally been diagnosed with multiple myeloma.  He was treated with Revlimid , Velcade , and prednisone .   He was admitted early in 1/21 with atrial fibrillation/RVR associated with ischemic colitis.  He underwent TEE-guided DCCV to NSR.   He was readmitted later in 1/21 with PNA.    He fell in 4/21, tripped and fractured his left proximal humerus.  He had ORIF in 5/21.   In 7/21, he had a left inguinal hernia repair.  Echo in 8/21 showed EF 60-65%, normal RV, ascending aorta 45 mm, stable TAVR valve with mean gradient 15 mmHg.   Echo in 8/22 showed EF 60-65% RV ok.  Bioprosthetic AVR s/p TAVR with mean gradient 13 IVC small.   Echo in 9/23 showed EF 65-70% with normal RV, PASP 34, s/p TAVR with mean gradient up to 19 mmHg.  Gated CT was then done  to look at the aortic valve more closely, this showed 4.4 cm ascending aorta with small PFO, HALT at aortic valve leaflet bases without significant restriction, this was consistent with leaflet thrombosis.   Follow up 10/23, Dr. 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  He suggested transition to warfarin with INR 2.5-3.5, but patient did not want to switch to warfarin.   He was in AF 08/01/22 and advised to start amiodarone  200 mg bid.  He had worsening SOB and CP and was seen in ED 08/06/22. ECG showed AF with controlled rate. Underwent DCCV  with restoration to NSR.  He stopped amiodarone  soon after that.   Echo in 11/24 showed EF 65-70%, RV normal, bioprosthetic aortic valve s/p TAVR with lower mean gradient at 13 mmHg, IVC normal.   Seen in the ED 12/26/23 for palpitations. Was in afib and cardioverted by EDP and sent home.   Patient was in AF at visit in 6/25, he was started back on amiodarone  and was brought back for DCCV.  When he presented for DCCV, he was back in NSR.   He returns today for followup of atrial fibrillation.  He was supposed to continue amiodarone  200 daily long-term, but it looks like he stopped it again.  He is in NSR today.  He feels fine, no dyspnea walking with his cane on flat ground.  No palpitations.  No chest pain.  He continues on Revlimid  for multiple myeloma.    ECG (personally reviewed): NSR, septal Qs  Labs (3/22): K 3.7, creatinine 1.05, LFTs normal Labs (9/23): K 4.3, creatinine 1.16, hgb 14.5 Labs (11/23): K 4.1, creatinine 1.20 Labs (7/24): K 4.3, creatinine 1.43 Labs (6/25): hgb 16.7, K 4.3, creatinine 1.23   Allergies (verified):  No Known Drug Allergies    Past Medical History:  1. Hypertension: ACEI cough. Edema with amlodipine .  2. Atrial fibrillation. The patient had new-onset atrial fibrillation in November 2009. He underwent ibutilide cardioversion successfully. He has had 1-2 episodes/year that are short-lived that likely are atrial fibrillation with RVR. He was admitted in 5/14 with atrial fibrillation/RVR and had TEE-guided DCCV.  Atrial fibrillation again in 5/17, converted back to NSR spontaneously.  CHADSVASC score 2.  - Atrial fibrillation 2/19 with TEE-guided DCCV.  - 1/21 TEE-guided DCCV - 11/23 DCCV - 4/25 DCCV 3. Hypercholesterolemia.  4. Bicuspid aortic valve disorder: Echo (7/11): EF 55-60%, mild LV hypertrophy, mild aortic stenosis with mean gradient 19 mmHg and peak gradient 36 mmHg.  TEE (5/14): EF 55%, mild LVH, bicuspid aortic valve with mild AS (mean gradient  13 mmHg), ascending aorta 4.5 cm.  Echo (9/15) with EF 65-70%, moderate AS (mean gradient 23 mmHg), mild AI, ascending aorta 4.1 cm, mild MR.   - Echo (4/17) with EF 65-70%, moderate aortic stenosis with mean gradient 27 mmHg, PASP 31 mmHg, ascending aorta 4.4 cm.  - Echo (2/18) with EF 55-60%, bicuspid aortic valve with moderate aortic stenosis (underestimated gradient).  - TEE (2/19): EF 60-65%, moderate LVH, bicuspid aortic valve with moderate AS with mean gradient 28 mmHg and AVA 1.2 cm^2, 4.4 cm ascending aorta.  - Echo (2/20): EF 60-65%, mild LVH, bicuspid aortic valve with severe AS (mean gradient 48 mmHg, AVA 0.8 cm^2).  - TAVR 8/20 with valve in valve Edwards Sapien 3 THVs (because of peri-valvular leak after initial valve placed).  - Echo (9/20): EF > 65%, mild LVH, mild RV dilation with normal RV  systolic function, mean aortic valve gradient 26 mmHg with dimensionless index 0.54, IVC normal.  - TEE (1/21): EF 60-65%, moderate LVH, normal RV, s/p valve-in-valve TAVR with mean gradient 8 mmHg and no PVL.  - Echo (8/21): EF 60-65%, normal RV, ascending aorta 45 mm, stable TAVR valve with mean gradient 15 mmHg.  - Echo (8/22): EF 70-75%, s/p TAVR mean gradient 12 mmHg.  - Echo (9/23): EF 65-70% with normal RV, PASP 34, s/p TAVR with mean gradient up to 19 mmHg. - Gated CT (9/23) showed 4.4 cm ascending aorta with small PFO, HALT at aortic valve leaflet bases without significant restriction, this was consistent with leaflet thrombosis. - Echo (11/24): EF 65-70%, RV normal, bioprosthetic aortic valve s/p TAVR with lower mean gradient at 13 mmHg, IVC normal.  5. Osteoarthritis.  6. Low back pain.  7. Chest pain: ETT-myoview (12/11) with 10:51 exercise, no chest pain, no significant ST changes, EF 69%, no evidence for ischemia or infarction.  - Coronary CTA (2/20): calcium  score 41, nonobstructive CAD.  - LHC (6/20): No significant CAD.  8. Ascending aortic aneurysm: Associated with bicuspid  aortic valve.  4.5 cm by TEE in 5/14.   MRA chest (6/14) with bicuspid aortic valve, 4.3 cm ascending aortic aneurysm.  MRA chest (10/15) with 4.2 cm ascending aorta (bicuspid aortic valve noted).  Echo (4/17) with ascending aorta diameter 4.4 cm.  - MRA chest (11/17) with 4.1 cm ascending aorta.  - Echo (2/19): 4.4 cm ascending aorta.  - Coronary CTA in 2/20 showed 4.3 cm ascending aorta.  - Echo (8/21) with 4.5 cm ascending aorta.  - Gated CT chest (9/23): 4.4 cm ascending aorta 9. Colonic polyps: GI bleeding in 11/19 s/p polypectomy.  10. Multiple myeloma.  11. Bradycardia 12. Thrombocytopenia: likely related to chemotherapy for multiple myeloma.  13. PVCs: Zio monitor in 10/20 showed 9.4% PVCs.  14. Chronic diastolic CHF: RHC (12/20) with mean RA 3, PA 22/2, mean PCWP 6, CI 2.74 15. Left inguinal hernia: s/p repair.    Current Outpatient Medications  Medication Sig Dispense Refill   aspirin  81 MG chewable tablet CHEW 1 TABLET BY MOUTH DAILY. 90 tablet 1   Carboxymethylcellul-Glycerin (LUBRICATING EYE DROPS OP) Place 1 drop into both eyes daily as needed (dry eyes).     Cholecalciferol  (VITAMIN D ) 50 MCG (2000 UT) tablet Take 2,000 Units by mouth daily.     diclofenac  Sodium (VOLTAREN ) 1 % GEL Apply 1 application topically 2 (two) times daily as needed (pain.).     ELIQUIS  5 MG TABS tablet TAKE 1 TABLET (5 MG TOTAL) BY MOUTH 2 (TWO) TIMES DAILY. NEEDS FOLLOW UP APPOINTMENT FORE MORE REFILLS 180 tablet 0   gabapentin  (NEURONTIN ) 300 MG capsule TAKE 1 CAPSULE BY MOUTH EVERYDAY AT BEDTIME 90 capsule 3   ipratropium (ATROVENT ) 0.06 % nasal spray Place 2 sprays into the nose 3 (three) times daily. 15 mL 2   lenalidomide  (REVLIMID ) 5 MG capsule TAKE 1 CAPSULE BY MOUTH 1 TIME A DAY FOR 21 DAYS ON THEN 7 DAYS OFF 21 capsule 0   Magnesium  Oxide -Mg Supplement 250 MG TABS Take 250 mg by mouth daily.     metoprolol  succinate (TOPROL -XL) 25 MG 24 hr tablet TAKE 1 TABLET (25 MG TOTAL) BY MOUTH AT  BEDTIME 90 tablet 3   metroNIDAZOLE  (METROCREAM ) 0.75 % cream Apply 1 application  topically 2 (two) times daily as needed (rash).     oxyCODONE  (OXY IR/ROXICODONE ) 5 MG immediate release tablet Take 1-2  tablets (5-10 mg total) by mouth 2 (two) times daily as needed for severe pain (pain score 7-10). 120 tablet 0   senna-docusate (SENOKOT-S) 8.6-50 MG tablet Take 2 tablets by mouth daily as needed for mild constipation or moderate constipation.     spironolactone  (ALDACTONE ) 25 MG tablet TAKE 1 TABLET BY MOUTH EVERY DAY 90 tablet 3   Vitamin D , Ergocalciferol , (DRISDOL ) 1.25 MG (50000 UNIT) CAPS capsule TAKE 1 CAPSULE BY MOUTH ONE TIME PER WEEK 12 capsule 3   amiodarone  (PACERONE ) 200 MG tablet Take 1 tablet (200 mg total) by mouth daily. 30 tablet 3   No current facility-administered medications for this encounter.   Allergies:  Xarelto  [rivaroxaban ], Corticosteroids, Ramipril , Zolpidem, and Benazepril    Social History:  The patient  reports that he has never smoked. He has never used smokeless tobacco. He reports that he does not currently use alcohol. He reports that he does not use drugs.   Family History:  The patient's family history includes Colon cancer (age of onset: 33) in his mother; Hypertension in an other family member.   ROS:  Please see the history of present illness.   All other systems are personally reviewed and negative.   Physical Exam:   BP (!) 146/60   Pulse 66   Ht 5' 9 (1.753 m)   Wt 76.6 kg (168 lb 12.8 oz)   SpO2 99%   BMI 24.93 kg/m  General: NAD Neck: No JVD, no thyromegaly or thyroid  nodule.  Lungs: Clear to auscultation bilaterally with normal respiratory effort. CV: Nondisplaced PMI.  Heart regular S1/S2, no S3/S4, 2/6 early SEM RUSB.  No peripheral edema.  No carotid bruit.  Normal pedal pulses.  Abdomen: Soft, nontender, no hepatosplenomegaly, no distention.  Skin: Intact without lesions or rashes.  Neurologic: Alert and oriented x 3.  Psych: Normal  affect. Extremities: No clubbing or cyanosis.  HEENT: Normal.   Recent Labs: 06/08/2023: B Natriuretic Peptide 61.4 01/11/2024: ALT 27 03/02/2024: BUN 15; Creatinine, Ser 1.23; Hemoglobin 16.7; Magnesium  2.2; Platelets 112; Potassium 4.3; Sodium 139  Personally reviewed   Wt Readings from Last 3 Encounters:  03/21/24 76.6 kg (168 lb 12.8 oz)  03/13/24 73 kg (161 lb)  03/02/24 72.6 kg (160 lb)    ASSESSMENT AND PLAN: 1. Atrial fibrillation: Paroxysmal. He is quite symptomatic when in atrial fibrillation.  Required DCCV 11/23 in ED and most recently 4/25. Back in AF in 6/25 but converted to NSR after starting amiodarone . He is in NSR today but stopped amiodarone .   - I think he should continue amiodarone  long-term to maintain NSR.  Restart today.  Will check LFTs and TSH in 2 wks.  - Continue Eliquis . - Continue Toprol  XL 25 mg daily.  2. Peripheral neuropathy: Patient developed a very painful peripheral neuropathy on Velcade .  This is still very symptomatic but seems to have slowly improved.  3. Bradycardia: After TAVR, patient had RBBB and LAFB with HR down to the 40s.  RBBB has resolved. Has tolerated low dose Toprol  w/o any significant recurrent bradycardia  - Restart amiodarone  as above, follow HR closely.  Can stop Toprol  XL if needed.  4. PVCs: Zio monitor in 10/20 showed 9.4% PVCs.  No PVCs noted on today's EKG - Restarting amiodarone  as above.  5. HTN:  BP mildly dilated.  - Continue Toprol  XL and spironolactone .  6. Bicuspid aortic valve disorder: S/P TAVR with 2 Edwards Sapien THVs (valve-in-valve due to peri-valvular leak after 1st valve placed). Post-op, mean  gradient across the aortic valve was elevated, 26 mmHg in 9/20.  Dimensionless index, in 9/20 0.54, only suggests mild bioprosthetic aortic stenosis.  Echo in 1/21 showed mean gradient down to 10 mmHg and TEE showed mean gradient 8 mmHg with no peri-valvular leakage.  Echo in 8/22 with mean gradient 12 mmHg.  Echo in 9/23  showed increase mean gradient up to 19 mmHg across bioprosthetic aortic valve.  Gated CT chest showed HALT at leaflet bases without restriction suggestive of leaflet thrombosis. Repeat echo in 11/24 showed EF 65-70%, RV normal, bioprosthetic aortic valve s/p TAVR with lower mean gradient at 13 mmHg, IVC normal.  - It was recommended that he transition from apixaban  to warfarin with INR 2.5-3.5. He did not want to take warfarin given need for monitoring and restriction on diet. Therefore, he will continue ASA 81 mg daily + Eliquis .  With lower mean gradient across the aortic valve on last echo, I think we can continue this plan.  7. Ascending aortic aneurysm: CT chest in 9/23 showed 4.4 cm ascending aorta. Echo 11/24 noted aortic root is normal in size and structure.  - Follow aorta by echo for now, he would likely be a poor candidate for ascending aorta replacement given frailty and age.  8. Chronic diastolic CHF:  NYHA Class II, confounded by age and recurrent Afib. Euvolemic on exam - does not need loop diuretic currently  9. Multiple myeloma: He continues on Revlimid .     Followup 6 months APP.   I spent 32 minutes reviewing records, interviewing/examining patient, and managing orders.   Signed, Ezra Shuck, MD  03/22/2024  [facility] [address] [facility] [place] [address] [phone] (office) [phone] (fax)

## 2024-04-04 ENCOUNTER — Other Ambulatory Visit (HOSPITAL_COMMUNITY)

## 2024-04-12 ENCOUNTER — Other Ambulatory Visit: Payer: Self-pay | Admitting: Hematology

## 2024-04-12 ENCOUNTER — Other Ambulatory Visit: Payer: Self-pay

## 2024-04-12 DIAGNOSIS — C9000 Multiple myeloma not having achieved remission: Secondary | ICD-10-CM

## 2024-04-12 NOTE — Progress Notes (Signed)
 HEMATOLOGY/ONCOLOGY CLINIC NOTE  Date of Service: 04/13/2024   Patient Care Team: [name], Karlynn GAILS, MD as PCP - General [name], MD as PCP - Advanced Heart Failure (Cardiology) [name], MD as PCP - Cardiology (Cardiology) [name], MD as Attending Physician (Neurosurgery) [name], MD as Consulting Physician (Dermatology) [name], MD as Consulting Physician (Pulmonary Disease) Patel, [name], DO as Consulting Physician (Neurology)   CHIEF COMPLAINTS/PURPOSE OF CONSULTATION:  Follow-up for continued evaluation and management of multiple myeloma  HISTORY OF PRESENTING ILLNESS:  Please see previous notes for details on initial presentation  INTERVAL HISTORY:  [name] [name] Is a 83 y.o. male is here for continued evaluation and management of multiple myeloma.  Patient was last seen by me on 01/11/2024 and reported fluctuating HR, worsened neuropathic pain in his feet, loss of strength, and bone stiffness.He also noted that he sometimes awakens in the middle of the night.  Patient presents with a cane during today's visit. He complains of leg pain which continues to be bothersome and fluctuates. His leg pain is bothersome at night which causes him to wake up. He still uses oxycodone  as needed when pain is bothersome. Patient reports that he continues gabapentin  only at night time, which he does not believe is effective.   He complains of joint/bone pain sometimes.   He notes that he started his current cycle of Revlimid  2 weeks ago.   He notes that his heart rate is generally 55-60 bpm, but sometimes is less than 50. Patient reports that he is not taking amiodarone  for atrial fibrillation.   Patient reports that he no longer takes Aspirin . He continues to be on Metoprolol  and Eliquis .   He reports some previous weight loss, which is gradually improving. Patient is noted to have gained 6 pounds in the last month with current weight of 167  pounds. He notes that he is able to consume small meals at a time.   He denies any abdominal pain. Patient reports some constipation sometimes.   Patient denies any new dental issues. He denies any upcoming procedures besides dental cleaning.    MEDICAL HISTORY:  Past Medical History:  Diagnosis Date   Ascending aortic aneurysm (HCC)    Bicuspid aortic valve    Cancer (HCC)    CHF NYHA class I (no symptoms from ordinary activities), acute, diastolic (HCC)    Dysrhythmia 7990   A fib   Fatty liver    mild   Fracture    left proximal humerus   GERD (gastroesophageal reflux disease)    GI bleeding 07/21/2018   post polypectomy   Hemorrhoids    HTN (hypertension)    Hypercholesteremia    Hypokalemia    Internal hemorrhoids    LBP (low back pain)    Moderate aortic stenosis    Osteoarthritis    Paroxysmal atrial fibrillation (HCC)    a. new onset Afib in 07/2008. He underwent ibutilide cardioversion successfully. b. Recurrence 01/2013 s/p TEE/DCCV - was on Xarelto  but he stopped it as he was convinced it was causing joint pn. c. Recurrence 01/2016 - spont conv to NSR. Pt took Eliquis  x1 mo then declined further anticoag. d. Recurrence 07/2016.   Pneumonia    Tubular adenoma of colon     SURGICAL HISTORY: Past Surgical History:  Procedure Laterality Date   BACK SURGERY  x12 years ago   CARDIAC CATHETERIZATION  2020   CARDIAC VALVE REPLACEMENT  2020   CARDIOVERSION  N/A 01/26/2013   Procedure: CARDIOVERSION;  Surgeon: Ezra GORMAN Shuck, MD;  Location: [facility] ENDOSCOPY;  Service: Cardiovascular;  Laterality: N/A;   CARDIOVERSION N/A 10/28/2017   Procedure: CARDIOVERSION;  Surgeon: Shuck Ezra GORMAN, MD;  Location: [facility] ENDOSCOPY;  Service: Cardiovascular;  Laterality: N/A;   CARDIOVERSION N/A 03/03/2018   Procedure: CARDIOVERSION;  Surgeon: Pietro Redell GORMAN, MD;  Location: [facility] ENDOSCOPY;  Service: Cardiovascular;  Laterality: N/A;   CARDIOVERSION N/A 09/19/2019   Procedure: CARDIOVERSION;   Surgeon: Shuck Ezra GORMAN, MD;  Location: [facility] ENDOSCOPY;  Service: Cardiovascular;  Laterality: N/A;   COLONOSCOPY     COLONOSCOPY  07/17/2018   at [facility]   HEMORRHOID SURGERY     INGUINAL HERNIA REPAIR Left 03/18/2020   Procedure: OPEN LEFT INGUINAL HERNIA REPAIR;  Surgeon: Ethyl Lenis, MD;  Location: [facility] OR;  Service: General;  Laterality: Left;   LUMBAR LAMINECTOMY     ORIF HUMERUS FRACTURE Left 01/15/2020   Procedure: OPEN REDUCTION INTERNAL FIXATION (ORIF) PROXIMAL HUMERUS FRACTURE;  Surgeon: Sharl Selinda Dover, MD;  Location: [facility] OR;  Service: Orthopedics;  Laterality: Left;   POLYPECTOMY     RIGHT HEART CATH N/A 08/20/2019   Procedure: RIGHT HEART CATH;  Surgeon: Shuck Ezra GORMAN, MD;  Location: [facility] INVASIVE CV LAB;  Service: Cardiovascular;  Laterality: N/A;   RIGHT/LEFT HEART CATH AND CORONARY ANGIOGRAPHY N/A 03/07/2019   Procedure: RIGHT/LEFT HEART CATH AND CORONARY ANGIOGRAPHY;  Surgeon: Verlin Lonni BIRCH, MD;  Location: [facility] INVASIVE CV LAB;  Service: Cardiovascular;  Laterality: N/A;   TAVAR  04/2019   TEE WITHOUT CARDIOVERSION N/A 01/26/2013   Procedure: TRANSESOPHAGEAL ECHOCARDIOGRAM (TEE);  Surgeon: Ezra GORMAN Shuck, MD;  Location: [facility] ENDOSCOPY;  Service: Cardiovascular;  Laterality: N/A;   TEE WITHOUT CARDIOVERSION N/A 10/28/2017   Procedure: TRANSESOPHAGEAL ECHOCARDIOGRAM (TEE);  Surgeon: Shuck Ezra GORMAN, MD;  Location: [facility] ENDOSCOPY;  Service: Cardiovascular;  Laterality: N/A;   TEE WITHOUT CARDIOVERSION N/A 05/08/2019   Procedure: TRANSESOPHAGEAL ECHOCARDIOGRAM (TEE);  Surgeon: Verlin Lonni BIRCH, MD;  Location: [facility] INVASIVE CV LAB;  Service: Open Heart Surgery;  Laterality: N/A;   TEE WITHOUT CARDIOVERSION N/A 09/19/2019   Procedure: TRANSESOPHAGEAL ECHOCARDIOGRAM (TEE);  Surgeon: Shuck Ezra GORMAN, MD;  Location: [facility] ENDOSCOPY;  Service: Cardiovascular;  Laterality: N/A;   TRANSCATHETER AORTIC VALVE REPLACEMENT, TRANSFEMORAL N/A 05/08/2019   Procedure: TRANSCATHETER AORTIC VALVE  REPLACEMENT, TRANSFEMORAL;  Surgeon: Verlin Lonni BIRCH, MD;  Location: [facility] INVASIVE CV LAB;  Service: Open Heart Surgery;  Laterality: N/A;    SOCIAL HISTORY: Social History   Socioeconomic History   Marital status: Married    Spouse name: Not on file   Number of children: 0   Years of education: Not on file   Highest education level: Not on file  Occupational History   Occupation: Retired Garment/textile technologist: [employer]  Tobacco Use   Smoking status: Never   Smokeless tobacco: Never  Vaping Use   Vaping status: Never Used  Substance and Sexual Activity   Alcohol use: Not Currently   Drug use: No   Sexual activity: Yes  Other Topics Concern   Not on file  Social History Narrative   Patient lives in [place] w/ his wife. He is a native of [place]. 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   Right-handed   Caffeine: 2 cups coffee per day   Two story home   Social Drivers of Health   Financial Resource Strain: Not on  file  Food Insecurity: No Food Insecurity (02/15/2023)   Hunger Vital Sign    Worried About Running Out of Food in the Last Year: Never true    Ran Out of Food in the Last Year: Never true  Transportation Needs: No Transportation Needs (02/15/2023)   PRAPARE - Administrator, Civil Service (Medical): No    Lack of Transportation (Non-Medical): No  Physical Activity: Not on file  Stress: Not on file  Social Connections: Not on file  Intimate Partner Violence: Not on file    FAMILY HISTORY: Family History  Problem Relation Age of Onset   Colon cancer Mother 32   Hypertension Other    Coronary artery disease Neg Hx    Colon polyps Neg Hx    Esophageal cancer Neg Hx    Rectal cancer Neg Hx    Stomach cancer Neg Hx     ALLERGIES:  is allergic to xarelto  [rivaroxaban ], corticosteroids, ramipril , zolpidem, and benazepril .  MEDICATIONS:  Current Outpatient Medications  Medication Sig  Dispense Refill   amiodarone  (PACERONE ) 200 MG tablet Take 1 tablet (200 mg total) by mouth daily. 30 tablet 3   aspirin  81 MG chewable tablet CHEW 1 TABLET BY MOUTH DAILY. 90 tablet 1   Carboxymethylcellul-Glycerin (LUBRICATING EYE DROPS OP) Place 1 drop into both eyes daily as needed (dry eyes).     Cholecalciferol  (VITAMIN D ) 50 MCG (2000 UT) tablet Take 2,000 Units by mouth daily.     diclofenac  Sodium (VOLTAREN ) 1 % GEL Apply 1 application topically 2 (two) times daily as needed (pain.).     ELIQUIS  5 MG TABS tablet TAKE 1 TABLET (5 MG TOTAL) BY MOUTH 2 (TWO) TIMES DAILY. NEEDS FOLLOW UP APPOINTMENT FORE MORE REFILLS 180 tablet 0   gabapentin  (NEURONTIN ) 300 MG capsule TAKE 1 CAPSULE BY MOUTH EVERYDAY AT BEDTIME 90 capsule 3   ipratropium (ATROVENT ) 0.06 % nasal spray Place 2 sprays into the nose 3 (three) times daily. 15 mL 2   lenalidomide  (REVLIMID ) 5 MG capsule TAKE 1 CAPSULE BY MOUTH 1 TIME A DAY FOR 21 DAYS ON THEN 7 DAYS OFF 21 capsule 0   Magnesium  Oxide -Mg Supplement 250 MG TABS Take 250 mg by mouth daily.     metoprolol  succinate (TOPROL -XL) 25 MG 24 hr tablet TAKE 1 TABLET (25 MG TOTAL) BY MOUTH AT BEDTIME 90 tablet 3   metroNIDAZOLE  (METROCREAM ) 0.75 % cream Apply 1 application  topically 2 (two) times daily as needed (rash).     oxyCODONE  (OXY IR/ROXICODONE ) 5 MG immediate release tablet Take 1-2 tablets (5-10 mg total) by mouth 2 (two) times daily as needed for severe pain (pain score 7-10). 120 tablet 0   senna-docusate (SENOKOT-S) 8.6-50 MG tablet Take 2 tablets by mouth daily as needed for mild constipation or moderate constipation.     spironolactone  (ALDACTONE ) 25 MG tablet TAKE 1 TABLET BY MOUTH EVERY DAY 90 tablet 3   Vitamin D , Ergocalciferol , (DRISDOL ) 1.25 MG (50000 UNIT) CAPS capsule TAKE 1 CAPSULE BY MOUTH ONE TIME PER WEEK 12 capsule 3   No current facility-administered medications for this visit.    REVIEW OF SYSTEMS:    10 Point review of Systems was done is  negative except as noted above.   PHYSICAL EXAMINATION: There were no vitals filed for this visit.  Wt Readings from Last 3 Encounters:  03/21/24 168 lb 12.8 oz (76.6 kg)  03/13/24 161 lb (73 kg)  03/02/24 160 lb (72.6 kg)  There is no height or weight on file to calculate BMI.    ECOG FS:2 - Symptomatic, <50% confined to bed  GENERAL:alert, in no acute distress and comfortable SKIN: no acute rashes, no significant lesions EYES: conjunctiva are pink and non-injected, sclera anicteric OROPHARYNX: MMM, no exudates, no oropharyngeal erythema or ulceration NECK: supple, no JVD LYMPH:  no palpable lymphadenopathy in the cervical, axillary or inguinal regions LUNGS: clear to auscultation b/l with normal respiratory effort HEART: regular rate & rhythm ABDOMEN:  normoactive bowel sounds , non tender, not distended. Extremity: no pedal edema PSYCH: alert & oriented x 3 with fluent speech NEURO: no focal motor/sensory deficits   LABORATORY DATA:  I have reviewed the data as listed  .    Latest Ref Rng & Units 04/13/2024    9:38 AM 03/02/2024   12:23 PM 01/11/2024   12:17 PM  CBC  WBC 4.0 - 10.5 K/uL 3.9  3.2  3.2   Hemoglobin 13.0 - 17.0 g/dL 85.9  83.2  85.5   Hematocrit 39.0 - 52.0 % 42.8  51.2  44.2   Platelets 150 - 400 K/uL 99  112  121     .    Latest Ref Rng & Units 04/13/2024    9:38 AM 03/02/2024   12:23 PM 01/11/2024   12:17 PM  CMP  Glucose 70 - 99 mg/dL 96  897  899   BUN 8 - 23 mg/dL 18  15  17    Creatinine 0.61 - 1.24 mg/dL 8.90  8.76  8.94   Sodium 135 - 145 mmol/L 140  139  138   Potassium 3.5 - 5.1 mmol/L 3.8  4.3  4.2   Chloride 98 - 111 mmol/L 105  104  105   CO2 22 - 32 mmol/L 30  28  29    Calcium  8.9 - 10.3 mg/dL 8.8  9.2  9.0   Total Protein 6.5 - 8.1 g/dL 6.4   6.4   Total Bilirubin 0.0 - 1.2 mg/dL 0.9   0.5   Alkaline Phos 38 - 126 U/L 70   76   AST 15 - 41 U/L 23   23   ALT 0 - 44 U/L 27   27          05/24/2019 Bone Marrow Biopsy     04/16/2019 Surgical Pathology:   Surgical Pathology  CASE: [id number]  PATIENT: [facility]  Bone Marrow Report      Clinical History: Multiple myeloma, remission status unspecified (HCC)  (BH)      DIAGNOSIS:   BONE MARROW, ASPIRATE, CLOT, CORE:  -Variably cellular bone marrow with trilineage hematopoiesis and 2%  plasma cells  -See comment   PERIPHERAL BLOOD:  -Slight thrombocytopenia   COMMENT:   The bone marrow is variably cellular with trilineage hematopoiesis  including abundant megakaryocytes in the more cellular areas.  In this  background, the plasma cells represent 2% of all cells in the aspirate  with interstitial cells and a few very minute clusters in the  clot/biopsy sections.  The latter in particular display kappa light  chain excess/restriction most suggestive of minimal residual plasma cell  neoplasm despite limited findings.  Correlation with cytogenetic and  FISH studies is recommended.    RADIOGRAPHIC STUDIES: I have personally reviewed the radiological images as listed and agreed with the findings in the report. No results found.   ASSESSMENT & PLAN:   83 y.o. male with:  #1 Plasma cell myeloma-currently in relapse  03/28/2019 MRI pelvis w/wo contrast revealed 1. Destructive bone lesions as detailed above. Findings most consistent with metastatic disease. PET-CT may be helpful for further evaluation and to establish a primary tumor. The right pelvic bone lesions should be amenable to image guided biopsy but a PET scan may demonstrate easier/safer biopsy sites. 2. No intrapelvic mass or adenopathy. 3. Benign intraosseous lipoma involving the left anterior superior acetabulum.  04/06/2019 PET whole body revealed 1. Diffuse osseous metastatic disease as detailed above without findings for a primary neoplasm in the chest, abdomen or pelvis. The large destructive lesion involving the right ischium should be amenable to image guided  biopsy. 2. Two small retroperitoneal lymph nodes and 1 small right obturator node showing hypermetabolism.   04/16/2019 Posterior right pelvis bone biopsy revealed "PLASMA CELL NEOPLASM"  05/24/2019 Bone Marrow Biopsy revealed BONE MARROW: - CELLULAR MARROW WITH INVOLVEMENT BY PLASMA CELL NEOPLASM (20%) PERIPHERAL BLOOD: - MORPHOLOGICALLY UNREMARKABLE  05/24/2019 FISH Panel revealed ABNORMAL result with 11q+, 14q+ and +17  #2 Severe aortic stenosis with bicuspid aortic valve -10/26/2018 ECHO revealed AVA at 0.8 cm2 and LV EF of 60-65% -05/08/2019 pt had a Transfemoral Transcatheter Aortic Valve Replacement  PET CT scan from 2/2 which shows There is persistent low level radiotracer uptake associated with the large lucent lesion within the superior right acetabulum. Additionally, there is a new focal area of increased radiotracer uptake above the background low level activity within this lesion with SUV max of 3.92. On the previous exam there was relatively homogeneous low level uptake within this lesion within SUV max of 2.43. Imaging findings are concerning for residual metabolically active tumor. Patient has seen radiation oncology since his last visit and they have decided to proceed with involved site radiation therapy to the FDG avid lesion to reduce the risk of fracture.  -He had his bone marrow biopsy on 11/03/2021 which shows 2% abnormal plasma cells.  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 CBC stable, showed WBC of 3.9K, hemoglobin of 14, and platelets of 99K. -he has no new anemia -CMP shows creatinine stable at 1.09; calcium  is not elevated, it is slightly low at 8.8  -discussed that the staging of myeloma is based on genetics. We discussed the status of his myeloma in detail -myeloma labs from today are currently pending -his last myeloma lab showed  that his M protein continues to be undetectable. However, one of his light chains is gradually increasing very slowly. We discussed that there is no rapid increase in light chains. We discussed that his gradual increase in light chains can be from the kidneys not clearing his light chains or from some cells producing more of it.  -discussed goal to keep his myeloma suppressed as much as we can -IFE shows findings of mild protein, which is not progressing.  -He has tolerated Revlimid  5 MG with no new or major toxicities -given his gradual increase in light chains, we discussed that it would not be unreasonable to increase his Revlimid  to a regular maintenance dose of 10 MG.  -Patient is agreeable to increasing Revlimid  to 10 MG at this time -we will increase Revlimid  from 5 MG to 10 MG, 3 weeks on 1 week off -discussed that there would be a need to monitor his blood counts to ensure that he tolerates higher dose Revlimid  -continue Eliquis   -patient is scheduled for Zometa  infusion  today -will follow-up on his remaining blood tests -do not recommend a bone marrow biopsy at this time -it has been nearly 2.5 years since his last PET scan.  -will plan for PET scan in 6 weeks -discussed that if his increasing light chains are from a particular spot in the bones, there may be a role for radiation therapy.  -discussed option to switch to a different medication if there is more broad disease -he shall return to clinic with us  in 2 months -continue to follow up with cardiologist, Dr. Rolan, and advised patient to confirm with him that patient has stopped Amiodarone .  -answered all of patient's questions in detail  FOLLOW UP: PET/CT in 6 weeks Labs in 6-7 weeks RTC with Dr Onesimo in 2 months  The total time spent in the appointment was 30 minutes* .  All of the patient's questions were answered with apparent satisfaction. The patient knows to call the clinic with any problems, questions or  concerns.   Emaline Onesimo MD MS AAHIVMS [facility] Hematology/Oncology Physician [facility]  .*Total Encounter Time as defined by the Centers for Medicare and Medicaid Services includes, in addition to the face-to-face time of a patient visit (documented in the note above) non-face-to-face time: obtaining and reviewing outside history, ordering and reviewing medications, tests or procedures, care coordination (communications with other health care professionals or caregivers) and documentation in the medical record.    I,Mitra Faeizi,acting as a Neurosurgeon for Emaline Onesimo, MD.,have documented all relevant documentation on the behalf of Emaline Onesimo, MD,as directed by  Emaline Onesimo, MD while in the presence of Emaline Onesimo, MD.  .I have reviewed the above documentation for accuracy and completeness, and I agree with the above. .Katriana Dortch Kishore Nilaya Bouie MD

## 2024-04-13 ENCOUNTER — Inpatient Hospital Stay

## 2024-04-13 ENCOUNTER — Inpatient Hospital Stay: Attending: Hematology

## 2024-04-13 ENCOUNTER — Inpatient Hospital Stay (HOSPITAL_BASED_OUTPATIENT_CLINIC_OR_DEPARTMENT_OTHER): Admitting: Hematology

## 2024-04-13 VITALS — BP 140/63 | HR 57 | Temp 97.7°F | Resp 18 | Wt 167.5 lb

## 2024-04-13 DIAGNOSIS — G620 Drug-induced polyneuropathy: Secondary | ICD-10-CM | POA: Insufficient documentation

## 2024-04-13 DIAGNOSIS — M898X9 Other specified disorders of bone, unspecified site: Secondary | ICD-10-CM | POA: Diagnosis not present

## 2024-04-13 DIAGNOSIS — C9000 Multiple myeloma not having achieved remission: Secondary | ICD-10-CM

## 2024-04-13 DIAGNOSIS — C9002 Multiple myeloma in relapse: Secondary | ICD-10-CM | POA: Insufficient documentation

## 2024-04-13 DIAGNOSIS — C7951 Secondary malignant neoplasm of bone: Secondary | ICD-10-CM

## 2024-04-13 DIAGNOSIS — Z7189 Other specified counseling: Secondary | ICD-10-CM

## 2024-04-13 LAB — CMP (CANCER CENTER ONLY)
ALT: 27 U/L (ref 0–44)
AST: 23 U/L (ref 15–41)
Albumin: 4.2 g/dL (ref 3.5–5.0)
Alkaline Phosphatase: 70 U/L (ref 38–126)
Anion gap: 5 (ref 5–15)
BUN: 18 mg/dL (ref 8–23)
CO2: 30 mmol/L (ref 22–32)
Calcium: 8.8 mg/dL — ABNORMAL LOW (ref 8.9–10.3)
Chloride: 105 mmol/L (ref 98–111)
Creatinine: 1.09 mg/dL (ref 0.61–1.24)
GFR, Estimated: >60 mL/min (ref >60)
Glucose, Bld: 96 mg/dL (ref 70–99)
Potassium: 3.8 mmol/L (ref 3.5–5.1)
Sodium: 140 mmol/L (ref 135–145)
Total Bilirubin: 0.9 mg/dL (ref 0.0–1.2)
Total Protein: 6.4 g/dL — ABNORMAL LOW (ref 6.5–8.1)

## 2024-04-13 LAB — CBC WITH DIFFERENTIAL (CANCER CENTER ONLY)
Abs Immature Granulocytes: 0.02 K/uL (ref 0.00–0.07)
Basophils Absolute: 0 K/uL (ref 0.0–0.1)
Basophils Relative: 1 %
Eosinophils Absolute: 0.2 K/uL (ref 0.0–0.5)
Eosinophils Relative: 4 %
HCT: 42.8 % (ref 39.0–52.0)
Hemoglobin: 14 g/dL (ref 13.0–17.0)
Immature Granulocytes: 1 %
Lymphocytes Relative: 19 %
Lymphs Abs: 0.7 K/uL (ref 0.7–4.0)
MCH: 30.7 pg (ref 26.0–34.0)
MCHC: 32.7 g/dL (ref 30.0–36.0)
MCV: 93.9 fL (ref 80.0–100.0)
Monocytes Absolute: 0.4 K/uL (ref 0.1–1.0)
Monocytes Relative: 9 %
Neutro Abs: 2.6 K/uL (ref 1.7–7.7)
Neutrophils Relative %: 66 %
Platelet Count: 99 K/uL — ABNORMAL LOW (ref 150–400)
RBC: 4.56 MIL/uL (ref 4.22–5.81)
RDW: 13.5 % (ref 11.5–15.5)
WBC Count: 3.9 K/uL — ABNORMAL LOW (ref 4.0–10.5)
nRBC: 0 % (ref 0.0–0.2)

## 2024-04-13 MED ORDER — SODIUM CHLORIDE 0.9 % IV SOLN: Freq: Once | INTRAVENOUS | Status: AC

## 2024-04-13 MED ORDER — LENALIDOMIDE 10 MG PO CAPS: 10.0000 mg | ORAL_CAPSULE | Freq: Every day | ORAL | Status: DC

## 2024-04-13 MED ORDER — ZOLEDRONIC ACID 4 MG/100ML IV SOLN
4.0000 mg | Freq: Once | INTRAVENOUS | Status: AC
Start: 2024-04-13 — End: 2024-04-13
  Administered 2024-04-13: 4 mg via INTRAVENOUS
  Filled 2024-04-13: qty 100

## 2024-04-13 NOTE — Progress Notes (Signed)
 Per Dr. Onesimo OK to proceed with Zometa  today with Calcium  8.8 mg/dL and corrected calcium  8.64 mg/dL.

## 2024-04-13 NOTE — Patient Instructions (Signed)

## 2024-04-16 LAB — KAPPA/LAMBDA LIGHT CHAINS
Kappa free light chain: 270.9 mg/L — ABNORMAL HIGH (ref 3.3–19.4)
Kappa, lambda light chain ratio: 25.32 — ABNORMAL HIGH (ref 0.26–1.65)
Lambda free light chains: 10.7 mg/L (ref 5.7–26.3)

## 2024-04-17 LAB — MULTIPLE MYELOMA PANEL, SERUM
Albumin SerPl Elph-Mcnc: 4 g/dL (ref 2.9–4.4)
Albumin/Glob SerPl: 2.1 — ABNORMAL HIGH (ref 0.7–1.7)
Alpha 1: 0.2 g/dL (ref 0.0–0.4)
Alpha2 Glob SerPl Elph-Mcnc: 0.5 g/dL (ref 0.4–1.0)
B-Globulin SerPl Elph-Mcnc: 0.8 g/dL (ref 0.7–1.3)
Gamma Glob SerPl Elph-Mcnc: 0.6 g/dL (ref 0.4–1.8)
Globulin, Total: 2 g/dL — ABNORMAL LOW (ref 2.2–3.9)
IgA: 114 mg/dL (ref 61–437)
IgG (Immunoglobin G), Serum: 686 mg/dL (ref 603–1613)
IgM (Immunoglobulin M), Srm: 24 mg/dL (ref 15–143)
Total Protein ELP: 6 g/dL (ref 6.0–8.5)

## 2024-04-18 ENCOUNTER — Other Ambulatory Visit: Payer: Self-pay

## 2024-04-18 DIAGNOSIS — C9000 Multiple myeloma not having achieved remission: Secondary | ICD-10-CM

## 2024-04-18 MED ORDER — LENALIDOMIDE 10 MG PO CAPS: 10.0000 mg | ORAL_CAPSULE | Freq: Every day | ORAL | 0 refills | Status: DC

## 2024-04-19 ENCOUNTER — Other Ambulatory Visit: Payer: Self-pay

## 2024-04-19 DIAGNOSIS — C9000 Multiple myeloma not having achieved remission: Secondary | ICD-10-CM

## 2024-04-19 MED ORDER — LENALIDOMIDE 10 MG PO CAPS: 10.0000 mg | ORAL_CAPSULE | Freq: Every day | ORAL | Status: DC

## 2024-04-20 ENCOUNTER — Encounter: Payer: Self-pay | Admitting: Hematology

## 2024-04-23 ENCOUNTER — Other Ambulatory Visit: Payer: Self-pay

## 2024-04-23 DIAGNOSIS — C9000 Multiple myeloma not having achieved remission: Secondary | ICD-10-CM

## 2024-04-23 MED ORDER — LENALIDOMIDE 10 MG PO CAPS
10.0000 mg | ORAL_CAPSULE | Freq: Every day | ORAL | Status: DC
Start: 2024-04-23 — End: 2024-04-24

## 2024-04-24 ENCOUNTER — Other Ambulatory Visit: Payer: Self-pay

## 2024-04-24 DIAGNOSIS — C9000 Multiple myeloma not having achieved remission: Secondary | ICD-10-CM

## 2024-04-24 MED ORDER — LENALIDOMIDE 10 MG PO CAPS: 10.0000 mg | ORAL_CAPSULE | Freq: Every day | ORAL | 0 refills | Status: DC

## 2024-04-24 MED ORDER — LENALIDOMIDE 10 MG PO CAPS
10.0000 mg | ORAL_CAPSULE | Freq: Every day | ORAL | 0 refills | Status: DC
Start: 2024-04-24 — End: 2024-04-24

## 2024-05-13 ENCOUNTER — Other Ambulatory Visit: Payer: Self-pay | Admitting: Hematology

## 2024-05-15 ENCOUNTER — Encounter: Payer: Self-pay | Admitting: Hematology

## 2024-05-15 ENCOUNTER — Other Ambulatory Visit: Payer: Self-pay | Admitting: Hematology

## 2024-05-15 DIAGNOSIS — C9000 Multiple myeloma not having achieved remission: Secondary | ICD-10-CM

## 2024-05-23 ENCOUNTER — Other Ambulatory Visit (HOSPITAL_COMMUNITY): Payer: Self-pay | Admitting: Cardiology

## 2024-05-24 ENCOUNTER — Encounter (HOSPITAL_COMMUNITY): Payer: Self-pay

## 2024-05-25 ENCOUNTER — Ambulatory Visit (HOSPITAL_COMMUNITY)

## 2024-05-31 ENCOUNTER — Ambulatory Visit (HOSPITAL_COMMUNITY)
Admission: RE | Admit: 2024-05-31 | Discharge: 2024-05-31 | Disposition: A | Discharge status: Home / Self Care | Source: Ambulatory Visit | Attending: Hematology | Admitting: Hematology

## 2024-05-31 DIAGNOSIS — C9000 Multiple myeloma not having achieved remission: Secondary | ICD-10-CM | POA: Insufficient documentation

## 2024-05-31 LAB — GLUCOSE, CAPILLARY: Glucose-Capillary: 93 mg/dL (ref 70–99)

## 2024-05-31 MED ORDER — FLUDEOXYGLUCOSE F - 18 (FDG) INJECTION
8.4000 | Freq: Once | INTRAVENOUS | Status: AC
  Administered 2024-05-31: 7.99 via INTRAVENOUS

## 2024-06-04 ENCOUNTER — Other Ambulatory Visit: Payer: Self-pay

## 2024-06-04 DIAGNOSIS — C9000 Multiple myeloma not having achieved remission: Secondary | ICD-10-CM

## 2024-06-05 ENCOUNTER — Encounter (HOSPITAL_COMMUNITY)

## 2024-06-08 ENCOUNTER — Inpatient Hospital Stay: Attending: Hematology

## 2024-06-08 DIAGNOSIS — C9002 Multiple myeloma in relapse: Secondary | ICD-10-CM | POA: Diagnosis not present

## 2024-06-08 DIAGNOSIS — C9000 Multiple myeloma not having achieved remission: Secondary | ICD-10-CM

## 2024-06-08 LAB — CBC WITH DIFFERENTIAL (CANCER CENTER ONLY)
Abs Immature Granulocytes: 0.04 K/uL (ref 0.00–0.07)
Basophils Absolute: 0 K/uL (ref 0.0–0.1)
Basophils Relative: 1 %
Eosinophils Absolute: 0.1 K/uL (ref 0.0–0.5)
Eosinophils Relative: 3 %
HCT: 43.8 % (ref 39.0–52.0)
Hemoglobin: 14.4 g/dL (ref 13.0–17.0)
Immature Granulocytes: 1 %
Lymphocytes Relative: 17 %
Lymphs Abs: 0.6 K/uL — ABNORMAL LOW (ref 0.7–4.0)
MCH: 31 pg (ref 26.0–34.0)
MCHC: 32.9 g/dL (ref 30.0–36.0)
MCV: 94.2 fL (ref 80.0–100.0)
Monocytes Absolute: 0.4 K/uL (ref 0.1–1.0)
Monocytes Relative: 11 %
Neutro Abs: 2.4 K/uL (ref 1.7–7.7)
Neutrophils Relative %: 67 %
Platelet Count: 85 K/uL — ABNORMAL LOW (ref 150–400)
RBC: 4.65 MIL/uL (ref 4.22–5.81)
RDW: 13.3 % (ref 11.5–15.5)
WBC Count: 3.5 K/uL — ABNORMAL LOW (ref 4.0–10.5)
nRBC: 0 % (ref 0.0–0.2)

## 2024-06-08 LAB — CMP (CANCER CENTER ONLY)
ALT: 28 U/L (ref 0–44)
AST: 24 U/L (ref 15–41)
Albumin: 4.4 g/dL (ref 3.5–5.0)
Alkaline Phosphatase: 78 U/L (ref 38–126)
Anion gap: 3 — ABNORMAL LOW (ref 5–15)
BUN: 18 mg/dL (ref 8–23)
CO2: 32 mmol/L (ref 22–32)
Calcium: 9.1 mg/dL (ref 8.9–10.3)
Chloride: 105 mmol/L (ref 98–111)
Creatinine: 1.22 mg/dL (ref 0.61–1.24)
GFR, Estimated: 58 mL/min — ABNORMAL LOW (ref >60)
Glucose, Bld: 99 mg/dL (ref 70–99)
Potassium: 4.1 mmol/L (ref 3.5–5.1)
Sodium: 140 mmol/L (ref 135–145)
Total Bilirubin: 0.8 mg/dL (ref 0.0–1.2)
Total Protein: 6.4 g/dL — ABNORMAL LOW (ref 6.5–8.1)

## 2024-06-11 LAB — KAPPA/LAMBDA LIGHT CHAINS
Kappa free light chain: 477.8 mg/L — ABNORMAL HIGH (ref 3.3–19.4)
Kappa, lambda light chain ratio: 45.08 — ABNORMAL HIGH (ref 0.26–1.65)
Lambda free light chains: 10.6 mg/L (ref 5.7–26.3)

## 2024-06-12 ENCOUNTER — Other Ambulatory Visit: Payer: Self-pay | Admitting: Hematology

## 2024-06-12 DIAGNOSIS — C9000 Multiple myeloma not having achieved remission: Secondary | ICD-10-CM

## 2024-06-12 LAB — MULTIPLE MYELOMA PANEL, SERUM
Albumin SerPl Elph-Mcnc: 3.7 g/dL (ref 2.9–4.4)
Albumin/Glob SerPl: 1.7 (ref 0.7–1.7)
Alpha 1: 0.2 g/dL (ref 0.0–0.4)
Alpha2 Glob SerPl Elph-Mcnc: 0.5 g/dL (ref 0.4–1.0)
B-Globulin SerPl Elph-Mcnc: 0.8 g/dL (ref 0.7–1.3)
Gamma Glob SerPl Elph-Mcnc: 0.7 g/dL (ref 0.4–1.8)
Globulin, Total: 2.3 g/dL (ref 2.2–3.9)
IgA: 104 mg/dL (ref 61–437)
IgG (Immunoglobin G), Serum: 688 mg/dL (ref 603–1613)
IgM (Immunoglobulin M), Srm: 19 mg/dL (ref 15–143)
M Protein SerPl Elph-Mcnc: 0.3 g/dL — ABNORMAL HIGH
Total Protein ELP: 6 g/dL (ref 6.0–8.5)

## 2024-06-14 ENCOUNTER — Ambulatory Visit: Admitting: Internal Medicine

## 2024-06-14 ENCOUNTER — Encounter: Payer: Self-pay | Admitting: Hematology

## 2024-06-14 ENCOUNTER — Encounter: Payer: Self-pay | Admitting: Internal Medicine

## 2024-06-14 ENCOUNTER — Ambulatory Visit (INDEPENDENT_AMBULATORY_CARE_PROVIDER_SITE_OTHER)

## 2024-06-14 VITALS — BP 136/78 | HR 54 | Ht 69.0 in | Wt 164.8 lb

## 2024-06-14 VITALS — BP 136/78 | HR 54 | Temp 97.7°F | Ht 69.0 in | Wt 164.0 lb

## 2024-06-14 DIAGNOSIS — I480 Paroxysmal atrial fibrillation: Secondary | ICD-10-CM | POA: Diagnosis not present

## 2024-06-14 DIAGNOSIS — Z0000 Encounter for general adult medical examination without abnormal findings: Secondary | ICD-10-CM

## 2024-06-14 DIAGNOSIS — M545 Low back pain, unspecified: Secondary | ICD-10-CM | POA: Diagnosis not present

## 2024-06-14 DIAGNOSIS — I10 Essential (primary) hypertension: Secondary | ICD-10-CM

## 2024-06-14 DIAGNOSIS — G8929 Other chronic pain: Secondary | ICD-10-CM

## 2024-06-14 DIAGNOSIS — C9000 Multiple myeloma not having achieved remission: Secondary | ICD-10-CM | POA: Diagnosis not present

## 2024-06-14 MED ORDER — OXYCODONE HCL 5 MG PO TABS: 5.0000 mg | ORAL_TABLET | Freq: Two times a day (BID) | ORAL | 0 refills | Status: DC | PRN

## 2024-06-14 NOTE — Patient Instructions (Addendum)
 [name] [name],  Thank you for taking the time for your Medicare Wellness Visit. I appreciate your continued commitment to your health goals. Please review the care plan we discussed, and feel free to reach out if I can assist you further.  Medicare recommends these wellness visits once per year to help you and your care team stay ahead of potential health issues. These visits are designed to focus on prevention, allowing your provider to concentrate on managing your acute and chronic conditions during your regular appointments.  Please note that Annual Wellness Visits do not include a physical exam. Some assessments may be limited, especially if the visit was conducted virtually. If needed, we may recommend a separate in-person follow-up with your provider.  Ongoing Care Seeing your primary care provider every 3 to 6 months helps us  monitor your health and provide consistent, personalized care. Last office visit on 03/13/2024.  You are due for a Flu vaccine and a pneumonia vaccine.  Keep up the good work.  Referrals If a referral was made during today's visit and you haven't received any updates within two weeks, please contact the referred provider directly to check on the status.  Recommended Screenings:  Health Maintenance  Topic Date Due   Medicare Annual Wellness Visit  Never done   Pneumococcal Vaccine for age over 49 (2 of 2 - PPSV23, PCV20, or PCV21) 01/20/2017   Flu Shot  04/13/2024   COVID-19 Vaccine (5 - Pfizer risk 2024-25 season) 05/14/2024   DTaP/Tdap/Td vaccine (2 - Tdap) 03/03/2025   Zoster (Shingles) Vaccine  Completed   HPV Vaccine  Aged Out   Meningitis B Vaccine  Aged Out       06/14/2024    9:59 AM  Advanced Directives  Does Patient Have a Medical Advance Directive? Yes  Type of Advance Directive Out of facility DNR (pink MOST or yellow form)   Advance Care Planning is important because it: Ensures you receive medical care that aligns with your values, goals, and  preferences. Provides guidance to your family and loved ones, reducing the emotional burden of decision-making during critical moments.  Vision: Annual vision screenings are recommended for early detection of glaucoma, cataracts, and diabetic retinopathy. These exams can also reveal signs of chronic conditions such as diabetes and high blood pressure.  Dental: Annual dental screenings help detect early signs of oral cancer, gum disease, and other conditions linked to overall health, including heart disease and diabetes.  Please see the attached documents for additional preventive care recommendations.   Managing Pain Without Opioids Opioids are strong medicines used to treat moderate to severe pain. For some people, especially those who have long-term (chronic) pain, opioids may not be the best choice for pain management due to: Side effects like nausea, constipation, and sleepiness. The risk of addiction (opioid use disorder). The longer you take opioids, the greater your risk of addiction. Pain that lasts for more than 3 months is called chronic pain. Managing chronic pain usually requires more than one approach and is often provided by a team of health care providers working together (multidisciplinary approach). Pain management may be done at a pain management center or pain clinic. How to manage pain without the use of opioids Use non-opioid medicines Non-opioid medicines for pain may include: Over-the-counter or prescription non-steroidal anti-inflammatory drugs (NSAIDs). These may be the first medicines used for pain. They work well for muscle and bone pain, and they reduce swelling. Acetaminophen . This over-the-counter medicine may work well for milder pain  but not swelling. Antidepressants. These may be used to treat chronic pain. A certain type of antidepressant (tricyclics) is often used. These medicines are given in lower doses for pain than when used for depression. Anticonvulsants.  These are usually used to treat seizures but may also reduce nerve (neuropathic) pain. Muscle relaxants. These relieve pain caused by sudden muscle tightening (spasms). You may also use a pain medicine that is applied to the skin as a patch, cream, or gel (topical analgesic), such as a numbing medicine. These may cause fewer side effects than medicines taken by mouth. Do certain therapies as directed Some therapies can help with pain management. They include: Physical therapy. You will do exercises to gain strength and flexibility. A physical therapist may teach you exercises to move and stretch parts of your body that are weak, stiff, or painful. You can learn these exercises at physical therapy visits and practice them at home. Physical therapy may also involve: Massage. Heat wraps or applying heat or cold to affected areas. Electrical signals that interrupt pain signals (transcutaneous electrical nerve stimulation, TENS). Weak lasers that reduce pain and swelling (low-level laser therapy). Signals from your body that help you learn to regulate pain (biofeedback). Occupational therapy. This helps you to learn ways to function at home and work with less pain. Recreational therapy. This involves trying new activities or hobbies, such as a physical activity or drawing. Mental health therapy, including: Cognitive behavioral therapy (CBT). This helps you learn coping skills for dealing with pain. Acceptance and commitment therapy (ACT) to change the way you think and react to pain. Relaxation therapies, including muscle relaxation exercises and mindfulness-based stress reduction. Pain management counseling. This may be individual, family, or group counseling.  Receive medical treatments Medical treatments for pain management include: Nerve block injections. These may include a pain blocker and anti-inflammatory medicines. You may have injections: Near the spine to relieve chronic back or neck  pain. Into joints to relieve back or joint pain. Into nerve areas that supply a painful area to relieve body pain. Into muscles (trigger point injections) to relieve some painful muscle conditions. A medical device placed near your spine to help block pain signals and relieve nerve pain or chronic back pain (spinal cord stimulation device). Acupuncture. Follow these instructions at home Medicines Take over-the-counter and prescription medicines only as told by your health care provider. If you are taking pain medicine, ask your health care providers about possible side effects to watch out for. Do not drive or use heavy machinery while taking prescription opioid pain medicine. Lifestyle  Do not use drugs or alcohol to reduce pain. If you drink alcohol, limit how much you have to: 0-1 drink a day for women who are not pregnant. 0-2 drinks a day for men. Know how much alcohol is in a drink. In the [place] one drink equals one 12 oz bottle of beer (355 mL), one 5 oz glass of wine (148 mL), or one 1 oz glass of hard liquor (44 mL). Do not use any products that contain nicotine or tobacco. These products include cigarettes, chewing tobacco, and vaping devices, such as e-cigarettes. If you need help quitting, ask your health care provider. Eat a healthy diet and maintain a healthy weight. Poor diet and excess weight may make pain worse. Eat foods that are high in fiber. These include fresh fruits and vegetables, whole grains, and beans. Limit foods that are high in fat and processed sugars, such as fried and sweet foods. Exercise  regularly. Exercise lowers stress and may help relieve pain. Ask your health care provider what activities and exercises are safe for you. If your health care provider approves, join an exercise class that combines movement and stress reduction. Examples include yoga and tai chi. Get enough sleep. Lack of sleep may make pain worse. Lower stress as much as possible.  Practice stress reduction techniques as told by your therapist. General instructions Work with all your pain management providers to find the treatments that work best for you. You are an important member of your pain management team. There are many things you can do to reduce pain on your own. Consider joining an online or in-person support group for people who have chronic pain. Keep all follow-up visits. This is important. Where to find more information You can find more information about managing pain without opioids from: American Academy of Pain Medicine: painmed.org Institute for Chronic Pain: instituteforchronicpain.org American Chronic Pain Association: theacpa.org Contact a health care provider if: You have side effects from pain medicine. Your pain gets worse or does not get better with treatments or home therapy. You are struggling with anxiety or depression. Summary Many types of pain can be managed without opioids. Chronic pain may respond better to pain management without opioids. Pain is best managed when you and a team of health care providers work together. Pain management without opioids may include non-opioid medicines, medical treatments, physical therapy, mental health therapy, and lifestyle changes. Tell your health care providers if your pain gets worse or is not being managed well enough. This information is not intended to replace advice given to you by your health care provider. Make sure you discuss any questions you have with your health care provider. Document Revised: 12/10/2020 Document Reviewed: 12/10/2020 Elsevier Patient Education  2024 [employer].

## 2024-06-14 NOTE — Assessment & Plan Note (Signed)
On Metoprolol, spironolactone

## 2024-06-14 NOTE — Assessment & Plan Note (Signed)
 Chronic Oxycodone  prn  Potential benefits of a long term opioids use as well as potential risks (i.e. addiction risk, apnea etc) and complications (i.e. Somnolence, constipation and others) were explained to the patient and were aknowledged. F/u Dr. Onesimo

## 2024-06-14 NOTE — Assessment & Plan Note (Signed)
Chronic Oxycodone prn  Potential benefits of a long term opioids use as well as potential risks (i.e. addiction risk, apnea etc) and complications (i.e. Somnolence, constipation and others) were explained to the patient and were aknowledged.

## 2024-06-14 NOTE — Assessment & Plan Note (Signed)
On  ASA, Eliquis

## 2024-06-14 NOTE — Progress Notes (Cosign Needed Addendum)
 Subjective:   [name] [name] is a 83 y.o. who presents for a Medicare Wellness preventive visit.  As a reminder, Annual Wellness Visits don't include a physical exam, and some assessments may be limited, especially if this visit is performed virtually. We may recommend an in-person follow-up visit with your provider if needed.  Visit Complete: In person  Persons Participating in Visit: Patient.  AWV Questionnaire: No: Patient Medicare AWV questionnaire was not completed prior to this visit.  Cardiac Risk Factors include: advanced age (>1men, >41 women);male gender;hypertension;Other (see comment);dyslipidemia, Risk factor comments: PAF, CAD     Objective:    Today's Vitals   06/14/24 0958 06/14/24 1016  BP:  136/78  Pulse:  (!) 54  SpO2:  100%  Weight:  164 lb 12.8 oz (74.8 kg)  Height:  5' 9 (1.753 m)  PainSc: 7     Body mass index is 24.34 kg/m.     06/14/2024    9:59 AM 12/26/2023   10:39 AM 02/09/2023   10:56 AM 11/13/2022    5:37 PM 11/12/2021    2:05 PM 11/03/2021    8:21 AM 11/18/2020   10:39 AM  Advanced Directives  Does Patient Have a Medical Advance Directive? Yes No Yes No Yes Yes Yes  Type of Advance Directive Out of facility DNR (pink MOST or yellow form)    Healthcare Power of [place];Living will Living will Healthcare Power of [place];Living will  Does patient want to make changes to medical advance directive?     No - Patient declined No - Patient declined   Copy of Healthcare Power of Attorney in Chart?       No - copy requested  Would patient like information on creating a medical advance directive?    No - Patient declined No - Patient declined      Current Medications (verified) Outpatient Encounter Medications as of 06/14/2024  Medication Sig   amiodarone  (PACERONE ) 200 MG tablet Take 1 tablet (200 mg total) by mouth daily.   apixaban  (ELIQUIS ) 5 MG TABS tablet Take 1 tablet (5 mg total) by mouth 2 (two) times daily.   aspirin  81 MG chewable tablet  CHEW 1 TABLET BY MOUTH DAILY.   Carboxymethylcellul-Glycerin (LUBRICATING EYE DROPS OP) Place 1 drop into both eyes daily as needed (dry eyes).   Cholecalciferol  (VITAMIN D ) 50 MCG (2000 UT) tablet Take 2,000 Units by mouth daily.   diclofenac  Sodium (VOLTAREN ) 1 % GEL Apply 1 application topically 2 (two) times daily as needed (pain.).   gabapentin  (NEURONTIN ) 300 MG capsule TAKE 1 CAPSULE BY MOUTH EVERYDAY AT BEDTIME   ipratropium (ATROVENT ) 0.06 % nasal spray Place 2 sprays into the nose 3 (three) times daily.   lenalidomide  (REVLIMID ) 10 MG capsule TAKE 1 CAPSULE BY MOUTH 1 TIME A DAY FOR 21 DAYS ON THEN 7 DAYS OFF   Magnesium  Oxide -Mg Supplement 250 MG TABS Take 250 mg by mouth daily.   metoprolol  succinate (TOPROL -XL) 25 MG 24 hr tablet TAKE 1 TABLET (25 MG TOTAL) BY MOUTH AT BEDTIME   metroNIDAZOLE  (METROCREAM ) 0.75 % cream Apply 1 application  topically 2 (two) times daily as needed (rash).   oxyCODONE  (OXY IR/ROXICODONE ) 5 MG immediate release tablet Take 1-2 tablets (5-10 mg total) by mouth 2 (two) times daily as needed for severe pain (pain score 7-10).   senna-docusate (SENOKOT-S) 8.6-50 MG tablet Take 2 tablets by mouth daily as needed for mild constipation or moderate constipation.   spironolactone  (ALDACTONE ) 25 MG  tablet TAKE 1 TABLET BY MOUTH EVERY DAY   Vitamin D , Ergocalciferol , (DRISDOL ) 1.25 MG (50000 UNIT) CAPS capsule TAKE 1 CAPSULE BY MOUTH ONE TIME PER WEEK   No facility-administered encounter medications on file as of 06/14/2024.    Allergies (verified) Xarelto  [rivaroxaban ], Corticosteroids, Ramipril , Zolpidem, and Benazepril    History: Past Medical History:  Diagnosis Date   Ascending aortic aneurysm    Bicuspid aortic valve    Cancer (HCC)    CHF NYHA class I (no symptoms from ordinary activities), acute, diastolic (HCC)    Dysrhythmia 7990   A fib   Fatty liver    mild   Fracture    left proximal humerus   GERD (gastroesophageal reflux disease)    GI  bleeding 07/21/2018   post polypectomy   Hemorrhoids    HTN (hypertension)    Hypercholesteremia    Hypokalemia    Internal hemorrhoids    LBP (low back pain)    Moderate aortic stenosis    Osteoarthritis    Paroxysmal atrial fibrillation (HCC)    a. new onset Afib in 07/2008. He underwent ibutilide cardioversion successfully. b. Recurrence 01/2013 s/p TEE/DCCV - was on Xarelto  but he stopped it as he was convinced it was causing joint pn. c. Recurrence 01/2016 - spont conv to NSR. Pt took Eliquis  x1 mo then declined further anticoag. d. Recurrence 07/2016.   Pneumonia    Tubular adenoma of colon    Past Surgical History:  Procedure Laterality Date   BACK SURGERY  x12 years ago   CARDIAC CATHETERIZATION  2020   CARDIAC VALVE REPLACEMENT  2020   CARDIOVERSION N/A 01/26/2013   Procedure: CARDIOVERSION;  Surgeon: Ezra GORMAN Shuck, MD;  Location[facility] ENDOSCOPY;  Service: Cardiovascular;  Laterality: N/A;   CARDIOVERSION N/A 10/28/2017   Procedure: CARDIOVERSION;  Surgeon: Shuck Ezra GORMAN, MD;  Location: [facility] ENDOSCOPY;  Service: Cardiovascular;  Laterality: N/A;   CARDIOVERSION N/A 03/03/2018   Procedure: CARDIOVERSION;  Surgeon: Pietro Redell GORMAN, MD;  Location: [facility] ENDOSCOPY;  Service: Cardiovascular;  Laterality: N/A;   CARDIOVERSION N/A 09/19/2019   Procedure: CARDIOVERSION;  Surgeon: Shuck Ezra GORMAN, MD;  Location: [facility] ENDOSCOPY;  Service: Cardiovascular;  Laterality: N/A;   COLONOSCOPY     COLONOSCOPY  07/17/2018   at [facility]   HEMORRHOID SURGERY     INGUINAL HERNIA REPAIR Left 03/18/2020   Procedure: OPEN LEFT INGUINAL HERNIA REPAIR;  Surgeon: Ethyl Lenis, MD;  Location: [facility] OR;  Service: General;  Laterality: Left;   LUMBAR LAMINECTOMY     ORIF HUMERUS FRACTURE Left 01/15/2020   Procedure: OPEN REDUCTION INTERNAL FIXATION (ORIF) PROXIMAL HUMERUS FRACTURE;  Surgeon: Sharl Selinda Dover, MD;  Location: [facility] OR;  Service: Orthopedics;  Laterality: Left;   POLYPECTOMY     RIGHT HEART CATH N/A  08/20/2019   Procedure: RIGHT HEART CATH;  Surgeon: Shuck Ezra GORMAN, MD;  Location: [facility] INVASIVE CV LAB;  Service: Cardiovascular;  Laterality: N/A;   RIGHT/LEFT HEART CATH AND CORONARY ANGIOGRAPHY N/A 03/07/2019   Procedure: RIGHT/LEFT HEART CATH AND CORONARY ANGIOGRAPHY;  Surgeon: Verlin Lonni BIRCH, MD;  Location: [facility] INVASIVE CV LAB;  Service: Cardiovascular;  Laterality: N/A;   TAVAR  04/2019   TEE WITHOUT CARDIOVERSION N/A 01/26/2013   Procedure: TRANSESOPHAGEAL ECHOCARDIOGRAM (TEE);  Surgeon: Ezra GORMAN Shuck, MD;  Location: [facility] ENDOSCOPY;  Service: Cardiovascular;  Laterality: N/A;   TEE WITHOUT CARDIOVERSION N/A 10/28/2017   Procedure: TRANSESOPHAGEAL ECHOCARDIOGRAM (TEE);  Surgeon: Shuck Ezra GORMAN, MD;  Location: [facility] ENDOSCOPY;  Service:  Cardiovascular;  Laterality: N/A;   TEE WITHOUT CARDIOVERSION N/A 05/08/2019   Procedure: TRANSESOPHAGEAL ECHOCARDIOGRAM (TEE);  Surgeon: Verlin Lonni BIRCH, MD;  Location: [facility] INVASIVE CV LAB;  Service: Open Heart Surgery;  Laterality: N/A;   TEE WITHOUT CARDIOVERSION N/A 09/19/2019   Procedure: TRANSESOPHAGEAL ECHOCARDIOGRAM (TEE);  Surgeon: [name], MD;  Location: [facility] ENDOSCOPY;  Service: Cardiovascular;  Laterality: N/A;   TRANSCATHETER AORTIC VALVE REPLACEMENT, TRANSFEMORAL N/A 05/08/2019   Procedure: TRANSCATHETER AORTIC VALVE REPLACEMENT, TRANSFEMORAL;  Surgeon: Verlin Lonni BIRCH, MD;  Location: [facility] INVASIVE CV LAB;  Service: Open Heart Surgery;  Laterality: N/A;   Family History  Problem Relation Age of Onset   Colon cancer Mother 44   Hypertension Other    Coronary artery disease Neg Hx    Colon polyps Neg Hx    Esophageal cancer Neg Hx    Rectal cancer Neg Hx    Stomach cancer Neg Hx    Social History   Socioeconomic History   Marital status: Married    Spouse name: Not on file   Number of children: 0   Years of education: Not on file   Highest education level: Not on file  Occupational History   Occupation: Retired  Garment/textile technologist: [employer]  Tobacco Use   Smoking status: Never   Smokeless tobacco: Never  Vaping Use   Vaping status: Never Used  Substance and Sexual Activity   Alcohol use: Not Currently   Drug use: No   Sexual activity: Yes  Other Topics Concern   Not on file  Social History Narrative   Patient lives in [place] w/ his wife. 2025/   He is a native of [place]. 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   Right-handed   Caffeine: 2 cups coffee per day   Two story home   Social Drivers of Health   Financial Resource Strain: Low Risk  (06/14/2024)   Overall Financial Resource Strain (CARDIA)    Difficulty of Paying Living Expenses: Not hard at all  Food Insecurity: No Food Insecurity (06/14/2024)   Hunger Vital Sign    Worried About Running Out of Food in the Last Year: Never true    Ran Out of Food in the Last Year: Never true  Transportation Needs: No Transportation Needs (06/14/2024)   PRAPARE - Administrator, Civil Service (Medical): No    Lack of Transportation (Non-Medical): No  Physical Activity: Inactive (06/14/2024)   Exercise Vital Sign    Days of Exercise per Week: 0 days    Minutes of Exercise per Session: 0 min  Stress: No Stress Concern Present (06/14/2024)   [employer] of Occupational Health - Occupational Stress Questionnaire    Feeling of Stress: Not at all  Social Connections: Moderately Integrated (06/14/2024)   Social Connection and Isolation Panel    Frequency of Communication with Friends and Family: Once a week    Frequency of Social Gatherings with Friends and Family: Once a week    Attends Religious Services: More than 4 times per year    Active Member of [employer] or Organizations: Yes    Attends Engineer, structural: More than 4 times per year    Marital Status: Married    Tobacco Counseling Counseling given: Not Answered    Clinical Intake:  Pre-visit preparation  completed: Yes  Pain : 0-10 Pain Score: 7  Pain Type: Chronic pain Pain Location: Back (  both legs) Pain Orientation: Lower Pain Descriptors / Indicators: Aching, Discomfort Pain Onset: More than a month ago Pain Frequency: Constant Pain Relieving Factors: Oxycodone  and Gabapentin   Pain Relieving Factors: Oxycodone  and Gabapentin   BMI - recorded: 24.34 Nutritional Risks: None Diabetes: No  Lab Results  Component Value Date   HGBA1C 5.8 (H) 05/04/2019   HGBA1C 5.7 (H) 08/02/2016   HGBA1C 5.7 08/24/2013     How often do you need to have someone help you when you read instructions, pamphlets, or other written materials from your doctor or pharmacy?: 1 - Never  Interpreter Needed?: No  Information entered by :: Antavious Spanos, RMA   Activities of Daily Living     06/14/2024    9:59 AM  In your present state of health, do you have any difficulty performing the following activities:  Hearing? 0  Vision? 0  Difficulty concentrating or making decisions? 0  Walking or climbing stairs? 0  Dressing or bathing? 0  Doing errands, shopping? 0  Preparing Food and eating ? N  Using the Toilet? N  In the past six months, have you accidently leaked urine? N  Do you have problems with loss of bowel control? N  Managing your Medications? N  Managing your Finances? N  Housekeeping or managing your Housekeeping? N    Patient Care Team: [name], Karlynn GAILS, MD as PCP - General [name], MD as PCP - Advanced Heart Failure (Cardiology) [name], MD as PCP - Cardiology (Cardiology) [name], MD as Attending Physician (Neurosurgery) [name], MD as Consulting Physician (Dermatology) [name], MD as Consulting Physician (Pulmonary Disease) Patel, [name], DO as Consulting Physician (Neurology)  I have updated your Care Teams any recent Medical Services you may have received from other providers in the past year.     Assessment:   This is a routine  wellness examination for [place].  Hearing/Vision screen Hearing Screening - Comments:: Denies hearing difficulties   Vision Screening - Comments:: Wears eyeglasses/[facility]    Goals Addressed   None    Depression Screen     06/14/2024   10:40 AM 04/13/2024   11:00 AM 08/25/2023   11:09 AM 08/25/2023   11:08 AM 05/25/2023   10:41 AM 02/22/2023   10:20 AM 11/23/2022   10:39 AM  PHQ 2/9 Scores  PHQ - 2 Score 0 0 0 0 0 0 0  PHQ- 9 Score 0  0   0     Fall Risk     06/14/2024   10:20 AM 11/24/2023   10:58 AM 08/25/2023   11:08 AM 05/25/2023   10:41 AM 02/22/2023   10:21 AM  Fall Risk   Falls in the past year? 0 0 0 0 0  Number falls in past yr: 0 0 0 0 0  Injury with Fall? 0 0 0 0 0  Risk for fall due to :  No Fall Risks No Fall Risks No Fall Risks No Fall Risks  Follow up Falls evaluation completed;Falls prevention discussed Falls evaluation completed Falls evaluation completed Falls evaluation completed Falls evaluation completed    MEDICARE RISK AT HOME:  Medicare Risk at Home Any stairs in or around the home?: Yes (2 story home) If so, are there any without handrails?: No Home free of loose throw rugs in walkways, pet beds, electrical cords, etc?: Yes Adequate lighting in your home to reduce risk of falls?: Yes Life alert?: No Use of a cane, walker or w/c?:  Yes (cane) Grab bars in the bathroom?: Yes Shower chair or bench in shower?: Yes Elevated toilet seat or a handicapped toilet?: Yes  TIMED UP AND GO:  Was the test performed?  Yes  Length of time to ambulate 10 feet: 35 sec Gait slow and steady with assistive device  Cognitive Function: Unable: Due to language barrier, hearing or vision limitations or otherlanguage barrier        Immunizations Immunization History  Administered Date(s) Administered   Fluad Quad(high Dose 65+) 06/14/2019, 08/26/2020, 06/03/2022   INFLUENZA, HIGH DOSE SEASONAL PF 07/12/2017, 07/12/2017, 08/04/2021, 08/17/2023    Influenza Whole 07/27/2010, 07/14/2012   Influenza-Unspecified 08/05/2015, 07/11/2017, 05/19/2018, 06/14/2019   PFIZER(Purple Top)SARS-COV-2 Vaccination 11/12/2019, 12/11/2019, 06/13/2020   Pfizer(Comirnaty)Fall Seasonal Vaccine 12 years and older 08/17/2023   Pneumococcal Conjugate,unspecified 11/25/2016   Pneumococcal Conjugate-13 11/25/2016   Pneumococcal-Unspecified 11/25/2016   Td 03/04/2015   Td (Adult), 2 Lf Tetanus Toxid, Preservative Free 03/04/2015   Zoster Recombinant(Shingrix) 05/20/2020, 08/20/2020   Zoster, Live 02/02/2012    Screening Tests Health Maintenance  Topic Date Due   Pneumococcal Vaccine: 50+ Years (2 of 2 - PPSV23, PCV20, or PCV21) 01/20/2017   Influenza Vaccine  04/13/2024   COVID-19 Vaccine (5 - Pfizer risk 2024-25 season) 05/14/2024   DTaP/Tdap/Td (2 - Tdap) 03/03/2025   Medicare Annual Wellness (AWV)  06/14/2025   Zoster Vaccines- Shingrix  Completed   HPV VACCINES  Aged Out   Meningococcal B Vaccine  Aged Out    Health Maintenance Items Addressed: See Nurse Notes at the end of this note  Additional Screening:  Vision Screening: Recommended annual ophthalmology exams for early detection of glaucoma and other disorders of the eye. Is the patient up to date with their annual eye exam?  No  Who is the provider or what is the name of the office in which the patient attends annual eye exams? [facility] op[facility]   Dental Screening: Recommended annual dental exams for proper oral hygiene  Community Resource Referral / Chronic Care Management: CRR required this visit?  No   CCM required this visit?  No   Plan:    I have personally reviewed and noted the following in the patient's chart:   Medical and social history Use of alcohol, tobacco or illicit drugs  Current medications and supplements including opioid prescriptions. Patient is currently taking opioid prescriptions. Information provided to patient regarding non-opioid alternatives.  Patient advised to discuss non-opioid treatment plan with their provider. Functional ability and status Nutritional status Physical activity Advanced directives List of other physicians Hospitalizations, surgeries, and ER visits in previous 12 months Vitals Screenings to include cognitive, depression, and falls Referrals and appointments  In addition, I have reviewed and discussed with patient certain preventive protocols, quality metrics, and best practice recommendations. A written personalized care plan for preventive services as well as general preventive health recommendations were provided to patient.   Geryl Dohn L Shawnn Bouillon, CMA   06/14/2024   After Visit Summary: (MyChart) Due to this being a telephonic visit, the after visit summary with patients personalized plan was offered to patient via MyChart   Notes: Patient is due for a Flu vaccine and a pneumonia vaccine.  He would like to discuss with provider today.  Patient had no other concerns to address today.  Medical screening examination/treatment/procedure(s) were performed by non-physician practitioner and as supervising physician I was immediately available for consultation/collaboration.  I agree with above. Karlynn Noel, MD

## 2024-06-14 NOTE — Progress Notes (Signed)
 Subjective:  Patient ID: [name] [name], male    DOB: [birth year]  Age: 83 y.o. MRN: [id number]  CC: Medical Management of Chronic Issues   HPI [name] [name] presents for chronic pain, PAF, HTN, MM Doing about the same. Labs - 1 wk ago  Outpatient Medications Prior to Visit  Medication Sig Dispense Refill   amiodarone  (PACERONE ) 200 MG tablet Take 1 tablet (200 mg total) by mouth daily. 30 tablet 3   apixaban  (ELIQUIS ) 5 MG TABS tablet Take 1 tablet (5 mg total) by mouth 2 (two) times daily. 180 tablet 0   aspirin  81 MG chewable tablet CHEW 1 TABLET BY MOUTH DAILY. 90 tablet 1   Carboxymethylcellul-Glycerin (LUBRICATING EYE DROPS OP) Place 1 drop into both eyes daily as needed (dry eyes).     Cholecalciferol  (VITAMIN D ) 50 MCG (2000 UT) tablet Take 2,000 Units by mouth daily.     diclofenac  Sodium (VOLTAREN ) 1 % GEL Apply 1 application topically 2 (two) times daily as needed (pain.).     gabapentin  (NEURONTIN ) 300 MG capsule TAKE 1 CAPSULE BY MOUTH EVERYDAY AT BEDTIME 90 capsule 3   ipratropium (ATROVENT ) 0.06 % nasal spray Place 2 sprays into the nose 3 (three) times daily. 15 mL 2   lenalidomide  (REVLIMID ) 10 MG capsule TAKE 1 CAPSULE BY MOUTH 1 TIME A DAY FOR 21 DAYS ON THEN 7 DAYS OFF 21 capsule 0   Magnesium  Oxide -Mg Supplement 250 MG TABS Take 250 mg by mouth daily.     metoprolol  succinate (TOPROL -XL) 25 MG 24 hr tablet TAKE 1 TABLET (25 MG TOTAL) BY MOUTH AT BEDTIME 90 tablet 3   metroNIDAZOLE  (METROCREAM ) 0.75 % cream Apply 1 application  topically 2 (two) times daily as needed (rash).     senna-docusate (SENOKOT-S) 8.6-50 MG tablet Take 2 tablets by mouth daily as needed for mild constipation or moderate constipation.     spironolactone  (ALDACTONE ) 25 MG tablet TAKE 1 TABLET BY MOUTH EVERY DAY 90 tablet 3   Vitamin D , Ergocalciferol , (DRISDOL ) 1.25 MG (50000 UNIT) CAPS capsule TAKE 1 CAPSULE BY MOUTH ONE TIME PER WEEK 12 capsule 3   oxyCODONE  (OXY IR/ROXICODONE ) 5 MG immediate  release tablet Take 1-2 tablets (5-10 mg total) by mouth 2 (two) times daily as needed for severe pain (pain score 7-10). 120 tablet 0   No facility-administered medications prior to visit.    ROS: Review of Systems  Constitutional:  Positive for fatigue. Negative for appetite change and unexpected weight change.  HENT:  Negative for congestion, nosebleeds, sneezing, sore throat and trouble swallowing.   Eyes:  Negative for itching and visual disturbance.  Respiratory:  Negative for cough.   Cardiovascular:  Negative for chest pain, palpitations and leg swelling.  Gastrointestinal:  Negative for abdominal distention, blood in stool, diarrhea and nausea.  Genitourinary:  Negative for frequency and hematuria.  Musculoskeletal:  Positive for arthralgias, back pain and gait problem. Negative for joint swelling and neck pain.  Skin:  Negative for rash.  Neurological:  Positive for weakness. Negative for dizziness, tremors, speech difficulty and light-headedness.  Hematological:  Bruises/bleeds easily.  Psychiatric/Behavioral:  Negative for agitation, confusion, dysphoric mood and sleep disturbance. The patient is not nervous/anxious.     Objective:  BP 136/78 (BP Location: Left Arm, Patient Position: Sitting, Cuff Size: Normal)   Pulse (!) 54   Temp 97.7 F (36.5 C) (Oral)   Ht 5' 9 (1.753 m)   Wt 164 lb (74.4 kg)   SpO2 100%  BMI 24.22 kg/m   BP Readings from Last 3 Encounters:  06/14/24 136/78  06/14/24 136/78  04/13/24 (!) 140/63    Wt Readings from Last 3 Encounters:  06/14/24 164 lb (74.4 kg)  06/14/24 164 lb 12.8 oz (74.8 kg)  04/13/24 167 lb 8 oz (76 kg)    Physical Exam Constitutional:      General: He is not in acute distress.    Appearance: Normal appearance. He is well-developed.     Comments: NAD  Eyes:     Conjunctiva/sclera: Conjunctivae normal.     Pupils: Pupils are equal, round, and reactive to light.  Neck:     Thyroid : No thyromegaly.      Vascular: No JVD.  Cardiovascular:     Rate and Rhythm: Normal rate and regular rhythm.     Heart sounds: Normal heart sounds. No murmur heard.    No friction rub. No gallop.  Pulmonary:     Effort: Pulmonary effort is normal. No respiratory distress.     Breath sounds: Normal breath sounds. No wheezing or rales.  Chest:     Chest wall: No tenderness.  Abdominal:     General: Bowel sounds are normal. There is no distension.     Palpations: Abdomen is soft. There is no mass.     Tenderness: There is no abdominal tenderness. There is no guarding or rebound.  Musculoskeletal:        General: Tenderness present. Normal range of motion.     Cervical back: Normal range of motion.     Right lower leg: No edema.     Left lower leg: No edema.  Lymphadenopathy:     Cervical: No cervical adenopathy.  Skin:    General: Skin is warm and dry.     Findings: Lesion present. No rash.  Neurological:     Mental Status: He is alert and oriented to person, place, and time.     Cranial Nerves: No cranial nerve deficit.     Motor: Weakness present. No abnormal muscle tone.     Coordination: Coordination abnormal.     Gait: Gait abnormal.     Deep Tendon Reflexes: Reflexes are normal and symmetric.  Psychiatric:        Behavior: Behavior normal.        Thought Content: Thought content normal.        Judgment: Judgment normal.   Ataxic, using a cale LS spine w/pain on ROM  Lab Results  Component Value Date   WBC 3.5 (L) 06/08/2024   HGB 14.4 06/08/2024   HCT 43.8 06/08/2024   PLT 85 (L) 06/08/2024   GLUCOSE 99 06/08/2024   CHOL 144 08/01/2018   TRIG 167.0 (H) 08/01/2018   HDL 28.20 (L) 08/01/2018   LDLDIRECT 151.5 11/20/2007   LDLCALC 82 08/01/2018   ALT 28 06/08/2024   AST 24 06/08/2024   NA 140 06/08/2024   K 4.1 06/08/2024   CL 105 06/08/2024   CREATININE 1.22 06/08/2024   BUN 18 06/08/2024   CO2 32 06/08/2024   TSH 5.338 (H) 06/23/2020   PSA 4.20 (H) 08/01/2018   INR 1.5 (H)  11/13/2022   HGBA1C 5.8 (H) 05/04/2019    NM PET Image Restage (PS) Whole Body Result Date: 06/04/2024 EXAM: PET CT WHOLE BODY 05/31/2024 09:42:47 AM TECHNIQUE: RADIOPHARMACEUTICAL: 7.99 mCi F-18 FDG Uptake time 60 minutes. Glucose level 93 mg/dl. PET imaging was acquired from the skull vertex through the feet. Non-contrast enhanced computed tomography was obtained  for attenuation correction and anatomic localization. COMPARISON: 10/15/2021 CLINICAL HISTORY: Hematologic malignancy, assess treatment response; evaluation for progrression of myeloma. Multiple myeloma not having achieved remission. FINDINGS: HEAD AND NECK: No cervical nodal hypermetabolism. No cervical adenopathy. Bilateral carotid atherosclerosis. CHEST: No thoracic nodal or pulmonary parenchymal hypermetabolism. Status post TAVR. Upper normal ascending aortic caliber at 3.9 cm. Aortic and coronary artery calcification. Centrilobular emphysema. A 2 mm subpleural right upper lobe pulmonary nodule is unchanged and below PET resolution. ABDOMEN AND PELVIS: No abdominal or pelvic nodal or parenchymal hypermetabolism. Normal adrenal glands. Bilateral low density renal lesions are likely cysts and do not warrant specific imaging follow up. Abdominal aortic atherosclerosis. Large colonic stool burden. Mild prostatomegaly. BONES AND SOFT TISSUE: New multifocal hypermetabolic osseous foci. Example posterior right third rib at SUV 5.0 on 73/4. Within the anterior second left rib at 1.2 cm and SUV 12.3 on 87/4. The right supra-acetabular/iliac lesion is no longer tracer avid, measuring SUV 1.6 today. Relatively similar in size and CT morphology at maximally 5.9 cm. No abnormal activity within the extremities. Left proximal humerus fixation. Osteopenia. IMPRESSION: 1. Findings consistent with progression of multiple myeloma with new multifocal hypermetabolic osseous lesions. 2. No evidence of hypermetabolic extraosseous myeloma. Electronically signed by: Rockey Kilts MD 06/04/2024 10:44 AM EDT RP Workstation: [id number]    Assessment & Plan:   Problem List Items Addressed This Visit     Back pain - Primary   Chronic Oxycodone  prn  Potential benefits of a long term opioids use as well as potential risks (i.e. addiction risk, apnea etc) and complications (i.e. Somnolence, constipation and others) were explained to the patient and were aknowledged.       Relevant Medications   oxyCODONE  (OXY IR/ROXICODONE ) 5 MG immediate release tablet   Essential hypertension   On Metoprolol , spironolactone       Multiple myeloma (HCC)   Chronic Oxycodone  prn  Potential benefits of a long term opioids use as well as potential risks (i.e. addiction risk, apnea etc) and complications (i.e. Somnolence, constipation and others) were explained to the patient and were aknowledged. F/u Dr. Onesimo       PAF (paroxysmal atrial fibrillation) (HCC)   On ASA, Eliquis          Meds ordered this encounter  Medications   oxyCODONE  (OXY IR/ROXICODONE ) 5 MG immediate release tablet    Sig: Take 1-2 tablets (5-10 mg total) by mouth 2 (two) times daily as needed for severe pain (pain score 7-10).    Dispense:  120 tablet    Refill:  0    Code: M54.9      Follow-up: Return in about 3 months (around 09/14/2024) for a follow-up visit.  Marolyn Noel, MD

## 2024-06-15 ENCOUNTER — Inpatient Hospital Stay: Attending: Hematology | Admitting: Hematology

## 2024-06-15 VITALS — BP 126/69 | HR 57 | Temp 97.5°F | Resp 18 | Wt 165.6 lb

## 2024-06-15 DIAGNOSIS — C9000 Multiple myeloma not having achieved remission: Secondary | ICD-10-CM

## 2024-06-15 DIAGNOSIS — Z7983 Long term (current) use of bisphosphonates: Secondary | ICD-10-CM | POA: Insufficient documentation

## 2024-06-15 DIAGNOSIS — Z7901 Long term (current) use of anticoagulants: Secondary | ICD-10-CM | POA: Diagnosis not present

## 2024-06-15 DIAGNOSIS — Z800 Family history of malignant neoplasm of digestive organs: Secondary | ICD-10-CM | POA: Insufficient documentation

## 2024-06-15 DIAGNOSIS — Z23 Encounter for immunization: Secondary | ICD-10-CM | POA: Diagnosis not present

## 2024-06-15 DIAGNOSIS — I350 Nonrheumatic aortic (valve) stenosis: Secondary | ICD-10-CM | POA: Insufficient documentation

## 2024-06-15 DIAGNOSIS — Z7189 Other specified counseling: Secondary | ICD-10-CM

## 2024-06-15 DIAGNOSIS — G620 Drug-induced polyneuropathy: Secondary | ICD-10-CM | POA: Insufficient documentation

## 2024-06-15 DIAGNOSIS — M549 Dorsalgia, unspecified: Secondary | ICD-10-CM | POA: Diagnosis not present

## 2024-06-15 DIAGNOSIS — C9002 Multiple myeloma in relapse: Secondary | ICD-10-CM | POA: Insufficient documentation

## 2024-06-15 NOTE — Progress Notes (Signed)
 HEMATOLOGY/ONCOLOGY CLINIC NOTE  Date of Service: 06/15/2024  Patient Care Team: [name], Karlynn GAILS, MD as PCP - General [name], MD as PCP - Advanced Heart Failure (Cardiology) [name], MD as PCP - Cardiology (Cardiology) [name], MD as Attending Physician (Neurosurgery) [name], MD as Consulting Physician (Dermatology) [name], MD as Consulting Physician (Pulmonary Disease) Patel, [name], DO as Consulting Physician (Neurology)   CHIEF COMPLAINTS/PURPOSE OF CONSULTATION:  Follow-up for continued evaluation and management of multiple myeloma  HISTORY OF PRESENTING ILLNESS:  (05/16/2019)  [name] [name] is a wonderful 83 y.o. male who has been referred to us  by Lamarr Hummer, PA for evaluation and management of lytic bone lesions. The pt reports that he is doing well overall.   The pt reports that he has occasional hip pain that radiates down his legs and prevents him from walking. He uses Advil, which helps his hip pain. He is used to exercising often, but has not been able to stay very physically active lately so he is gaining weight. The pt experiences some SOB when he wakes up in the morning.   The pt had a pre-procedural CTA C/A/P before a TAVR completed on 03/14/2019 which revealed indeterminate osseus lesions in the bony pelvis, which led to an MRI and PET scan. He reports that he has pain when he pushes on his right chest.    He also notes that he had a fall while exercising in 06/2018 and thought he broke his back. He received a blood transfusion on 07/24/2018.    Of note prior to the patient's visit today, the pt has had a MRI pelvis w/wo contrast completed on 03/28/2019 with results revealing 1. Destructive bone lesions as detailed above. Findings most consistent with metastatic disease. PET-CT may be helpful for further evaluation and to establish a primary tumor. The right pelvic bone lesions should be amenable to image guided  biopsy but a PET scan may demonstrate easier/safer biopsy sites. 2. No intrapelvic mass or adenopathy. 3. Benign intraosseous lipoma involving the left anterior superior acetabulum.   The pt has also had PET whole body completed on 04/06/2019 with results revealing 1. Diffuse osseous metastatic disease as detailed above without findings for a primary neoplasm in the chest, abdomen or pelvis. The large destructive lesion involving the right ischium should be amenable to image guided biopsy. 2. Two small retroperitoneal lymph nodes and 1 small right obturator node showing hypermetabolism.    Most recent lab results (04/06/2019) of CBC is as follows: all values are WNL.   On review of systems, pt reports hip and leg pain, weight gain and denies syncope and denies belly pain, recent neuropathy and any other symptoms.    On PMHx the pt reports 5 cm hepatic flexure polyp removal, pneumonia, blood transfusion on 07/24/2018 On Social Hx the pt reports that he lives at home with his wife.  INTERVAL HISTORY:  [name] [name] Is a 83 y.o. male is here for continued evaluation and management of multiple myeloma. He is ambulating with a cane. Patient was last seen by me on 04/13/2024 and reported fluctuating HR (usually 55-60 BPM, sometimes >50, not taking Amiodarone  then), worsening neuropathic pan in feet, loss of strength, bone stiffness, occasional joint/bone pain, occasional constipation. Noted improving weight loss and had been capable of consuming small meals. C/o of leg pain, causing him to wake up during the night - does use Oxycodone  as needed for moderate pain, and was using Gabapentin   at night, though did not believe it to be effective.  Today, he reports that he is still experiencing joint/bone pain - specifies back pain and occasional rib pain-, myalgias, and neuropathy in his lower extremities that occasionally affects him. States that he does continue to use Oxycodone  as need for pain management,  but notes that he has difficulty sleeping the night of.  Says that he is otherwise doing well. Denies dental issues, abnormal constipation.   MEDICAL HISTORY:  Past Medical History:  Diagnosis Date   Ascending aortic aneurysm    Bicuspid aortic valve    Cancer (HCC)    CHF NYHA class I (no symptoms from ordinary activities), acute, diastolic (HCC)    Dysrhythmia 7990   A fib   Fatty liver    mild   Fracture    left proximal humerus   GERD (gastroesophageal reflux disease)    GI bleeding 07/21/2018   post polypectomy   Hemorrhoids    HTN (hypertension)    Hypercholesteremia    Hypokalemia    Internal hemorrhoids    LBP (low back pain)    Moderate aortic stenosis    Osteoarthritis    Paroxysmal atrial fibrillation (HCC)    a. new onset Afib in 07/2008. He underwent ibutilide cardioversion successfully. b. Recurrence 01/2013 s/p TEE/DCCV - was on Xarelto  but he stopped it as he was convinced it was causing joint pn. c. Recurrence 01/2016 - spont conv to NSR. Pt took Eliquis  x1 mo then declined further anticoag. d. Recurrence 07/2016.   Pneumonia    Tubular adenoma of colon     SURGICAL HISTORY: Past Surgical History:  Procedure Laterality Date   BACK SURGERY  x12 years ago   CARDIAC CATHETERIZATION  2020   CARDIAC VALVE REPLACEMENT  2020   CARDIOVERSION N/A 01/26/2013   Procedure: CARDIOVERSION;  Surgeon: Ezra GORMAN Shuck, MD;  Location: [facility] ENDOSCOPY;  Service: Cardiovascular;  Laterality: N/A;   CARDIOVERSION N/A 10/28/2017   Procedure: CARDIOVERSION;  Surgeon: Shuck Ezra GORMAN, MD;  Location: [facility] ENDOSCOPY;  Service: Cardiovascular;  Laterality: N/A;   CARDIOVERSION N/A 03/03/2018   Procedure: CARDIOVERSION;  Surgeon: Pietro Redell GORMAN, MD;  Location: [facility] ENDOSCOPY;  Service: Cardiovascular;  Laterality: N/A;   CARDIOVERSION N/A 09/19/2019   Procedure: CARDIOVERSION;  Surgeon: Shuck Ezra GORMAN, MD;  Location: [facility] ENDOSCOPY;  Service: Cardiovascular;  Laterality: N/A;    COLONOSCOPY     COLONOSCOPY  07/17/2018   at [facility]   HEMORRHOID SURGERY     INGUINAL HERNIA REPAIR Left 03/18/2020   Procedure: OPEN LEFT INGUINAL HERNIA REPAIR;  Surgeon: Ethyl Lenis, MD;  Location: [facility] OR;  Service: General;  Laterality: Left;   LUMBAR LAMINECTOMY     ORIF HUMERUS FRACTURE Left 01/15/2020   Procedure: OPEN REDUCTION INTERNAL FIXATION (ORIF) PROXIMAL HUMERUS FRACTURE;  Surgeon: Sharl Selinda Dover, MD;  Location: [facility] OR;  Service: Orthopedics;  Laterality: Left;   POLYPECTOMY     RIGHT HEART CATH N/A 08/20/2019   Procedure: RIGHT HEART CATH;  Surgeon: Shuck Ezra GORMAN, MD;  Location: [facility] INVASIVE CV LAB;  Service: Cardiovascular;  Laterality: N/A;   RIGHT/LEFT HEART CATH AND CORONARY ANGIOGRAPHY N/A 03/07/2019   Procedure: RIGHT/LEFT HEART CATH AND CORONARY ANGIOGRAPHY;  Surgeon: Verlin Lonni BIRCH, MD;  Location: [facility] INVASIVE CV LAB;  Service: Cardiovascular;  Laterality: N/A;   TAVAR  04/2019   TEE WITHOUT CARDIOVERSION N/A 01/26/2013   Procedure: TRANSESOPHAGEAL ECHOCARDIOGRAM (TEE);  Surgeon: Ezra GORMAN Shuck, MD;  Location: [facility] ENDOSCOPY;  Service:  Cardiovascular;  Laterality: N/A;   TEE WITHOUT CARDIOVERSION N/A 10/28/2017   Procedure: TRANSESOPHAGEAL ECHOCARDIOGRAM (TEE);  Surgeon: [name], MD;  Location: [facility] ENDOSCOPY;  Service: Cardiovascular;  Laterality: N/A;   TEE WITHOUT CARDIOVERSION N/A 05/08/2019   Procedure: TRANSESOPHAGEAL ECHOCARDIOGRAM (TEE);  Surgeon: Verlin Lonni BIRCH, MD;  Location: [facility] INVASIVE CV LAB;  Service: Open Heart Surgery;  Laterality: N/A;   TEE WITHOUT CARDIOVERSION N/A 09/19/2019   Procedure: TRANSESOPHAGEAL ECHOCARDIOGRAM (TEE);  Surgeon: [name], MD;  Location: [facility] ENDOSCOPY;  Service: Cardiovascular;  Laterality: N/A;   TRANSCATHETER AORTIC VALVE REPLACEMENT, TRANSFEMORAL N/A 05/08/2019   Procedure: TRANSCATHETER AORTIC VALVE REPLACEMENT, TRANSFEMORAL;  Surgeon: Verlin Lonni BIRCH, MD;  Location: [facility] INVASIVE CV LAB;   Service: Open Heart Surgery;  Laterality: N/A;    SOCIAL HISTORY: Social History   Socioeconomic History   Marital status: Married    Spouse name: Not on file   Number of children: 0   Years of education: Not on file   Highest education level: Not on file  Occupational History   Occupation: Retired Garment/textile technologist: [employer]  Tobacco Use   Smoking status: Never   Smokeless tobacco: Never  Vaping Use   Vaping status: Never Used  Substance and Sexual Activity   Alcohol use: Not Currently   Drug use: No   Sexual activity: Yes  Other Topics Concern   Not on file  Social History Narrative   Patient lives in [place] w/ his wife. 2025/   He is a native of [place]. 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   Right-handed   Caffeine: 2 cups coffee per day   Two story home   Social Drivers of Health   Financial Resource Strain: Low Risk  (06/14/2024)   Overall Financial Resource Strain (CARDIA)    Difficulty of Paying Living Expenses: Not hard at all  Food Insecurity: No Food Insecurity (06/14/2024)   Hunger Vital Sign    Worried About Running Out of Food in the Last Year: Never true    Ran Out of Food in the Last Year: Never true  Transportation Needs: No Transportation Needs (06/14/2024)   PRAPARE - Administrator, Civil Service (Medical): No    Lack of Transportation (Non-Medical): No  Physical Activity: Inactive (06/14/2024)   Exercise Vital Sign    Days of Exercise per Week: 0 days    Minutes of Exercise per Session: 0 min  Stress: No Stress Concern Present (06/14/2024)   [employer] of Occupational Health - Occupational Stress Questionnaire    Feeling of Stress: Not at all  Social Connections: Moderately Integrated (06/14/2024)   Social Connection and Isolation Panel    Frequency of Communication with Friends and Family: Once a week    Frequency of Social Gatherings with Friends and Family: Once a  week    Attends Religious Services: More than 4 times per year    Active Member of [employer] or Organizations: Yes    Attends Engineer, structural: More than 4 times per year    Marital Status: Married  Catering manager Violence: Not At Risk (06/14/2024)   Humiliation, Afraid, Rape, and Kick questionnaire    Fear of Current or Ex-Partner: No    Emotionally Abused: No    Physically Abused: No    Sexually Abused: No    FAMILY HISTORY: Family History  Problem Relation Age of Onset   Colon  cancer Mother 36   Hypertension Other    Coronary artery disease Neg Hx    Colon polyps Neg Hx    Esophageal cancer Neg Hx    Rectal cancer Neg Hx    Stomach cancer Neg Hx     ALLERGIES:  is allergic to xarelto  [rivaroxaban ], corticosteroids, ramipril , zolpidem, and benazepril .  MEDICATIONS:  Current Outpatient Medications  Medication Sig Dispense Refill   amiodarone  (PACERONE ) 200 MG tablet Take 1 tablet (200 mg total) by mouth daily. 30 tablet 3   apixaban  (ELIQUIS ) 5 MG TABS tablet Take 1 tablet (5 mg total) by mouth 2 (two) times daily. 180 tablet 0   aspirin  81 MG chewable tablet CHEW 1 TABLET BY MOUTH DAILY. 90 tablet 1   Carboxymethylcellul-Glycerin (LUBRICATING EYE DROPS OP) Place 1 drop into both eyes daily as needed (dry eyes).     Cholecalciferol  (VITAMIN D ) 50 MCG (2000 UT) tablet Take 2,000 Units by mouth daily.     diclofenac  Sodium (VOLTAREN ) 1 % GEL Apply 1 application topically 2 (two) times daily as needed (pain.).     gabapentin  (NEURONTIN ) 300 MG capsule TAKE 1 CAPSULE BY MOUTH EVERYDAY AT BEDTIME 90 capsule 3   ipratropium (ATROVENT ) 0.06 % nasal spray Place 2 sprays into the nose 3 (three) times daily. 15 mL 2   lenalidomide  (REVLIMID ) 10 MG capsule TAKE 1 CAPSULE BY MOUTH 1 TIME A DAY FOR 21 DAYS ON THEN 7 DAYS OFF 21 capsule 0   Magnesium  Oxide -Mg Supplement 250 MG TABS Take 250 mg by mouth daily.     metoprolol  succinate (TOPROL -XL) 25 MG 24 hr tablet TAKE 1 TABLET  (25 MG TOTAL) BY MOUTH AT BEDTIME 90 tablet 3   metroNIDAZOLE  (METROCREAM ) 0.75 % cream Apply 1 application  topically 2 (two) times daily as needed (rash).     oxyCODONE  (OXY IR/ROXICODONE ) 5 MG immediate release tablet Take 1-2 tablets (5-10 mg total) by mouth 2 (two) times daily as needed for severe pain (pain score 7-10). 120 tablet 0   senna-docusate (SENOKOT-S) 8.6-50 MG tablet Take 2 tablets by mouth daily as needed for mild constipation or moderate constipation.     spironolactone  (ALDACTONE ) 25 MG tablet TAKE 1 TABLET BY MOUTH EVERY DAY 90 tablet 3   Vitamin D , Ergocalciferol , (DRISDOL ) 1.25 MG (50000 UNIT) CAPS capsule TAKE 1 CAPSULE BY MOUTH ONE TIME PER WEEK 12 capsule 3   No current facility-administered medications for this visit.    REVIEW OF SYSTEMS:    10 Point review of Systems was done is negative except as noted above.   PHYSICAL EXAMINATION: Vitals:   06/15/24 0950  BP: 126/69  Pulse: (!) 57  Resp: 18  Temp: (!) 97.5 F (36.4 C)  SpO2: 100%    Wt Readings from Last 3 Encounters:  06/15/24 165 lb 9.6 oz (75.1 kg)  06/14/24 164 lb (74.4 kg)  06/14/24 164 lb 12.8 oz (74.8 kg)   Body mass index is 24.45 kg/m.    ECOG FS:2 - Symptomatic, <50% confined to bed . GENERAL:alert, in no acute distress and comfortable SKIN: no acute rashes, no significant lesions EYES: conjunctiva are pink and non-injected, sclera anicteric OROPHARYNX: MMM, no exudates, no oropharyngeal erythema or ulceration NECK: supple, no JVD LYMPH:  no palpable lymphadenopathy in the cervical, axillary or inguinal regions LUNGS: clear to auscultation b/l with normal respiratory effort HEART: regular rate & rhythm ABDOMEN:  normoactive bowel sounds , non tender, not distended. No palpable hepatosplenomegaly. Extremity: no pedal  edema PSYCH: alert & oriented x 3 with fluent speech NEURO: no focal motor/sensory deficits   LABORATORY DATA:  I have reviewed the data as listed      Latest  Ref Rng & Units 06/08/2024    9:44 AM 04/13/2024    9:38 AM 03/02/2024   12:23 PM  CBC  WBC 4.0 - 10.5 K/uL 3.5  3.9  3.2   Hemoglobin 13.0 - 17.0 g/dL 85.5  85.9  83.2   Hematocrit 39.0 - 52.0 % 43.8  42.8  51.2   Platelets 150 - 400 K/uL 85  99  112    Component     Latest Ref Rng 06/08/2024  IgG (Immunoglobin G), Serum     603 - 1,613 mg/dL 311   IgA     61 - 562 mg/dL 895   IgM (Immunoglobulin M), Srm     15 - 143 mg/dL 19   Total Protein ELP     6.0 - 8.5 g/dL 6.0 (C)  Albumin  SerPl Elph-Mcnc     2.9 - 4.4 g/dL 3.7 (C)  Alpha 1     0.0 - 0.4 g/dL 0.2 (C)  Alpha2 Glob SerPl Elph-Mcnc     0.4 - 1.0 g/dL 0.5 (C)  B-Globulin SerPl Elph-Mcnc     0.7 - 1.3 g/dL 0.8 (C)  Gamma Glob SerPl Elph-Mcnc     0.4 - 1.8 g/dL 0.7 (C)  M Protein SerPl Elph-Mcnc     Not Observed g/dL 0.3 (H) (C)  Globulin, Total     2.2 - 3.9 g/dL 2.3 (C)  Albumin /Glob SerPl     0.7 - 1.7  1.7 (C)  IFE 1 Comment ! (C)  Please Note (HCV): Comment (C)    Component     Latest Ref Rng 06/08/2024  Kappa free light chain     3.3 - 19.4 mg/L 477.8 (H)   Lambda free light chains     5.7 - 26.3 mg/L 10.6   Kappa, lambda light chain ratio     0.26 - 1.65  45.08 (H)         Latest Ref Rng & Units 06/08/2024    9:44 AM 04/13/2024    9:38 AM 03/02/2024   12:23 PM  CMP  Glucose 70 - 99 mg/dL 99  96  897   BUN 8 - 23 mg/dL 18  18  15    Creatinine 0.61 - 1.24 mg/dL 8.77  8.90  8.76   Sodium 135 - 145 mmol/L 140  140  139   Potassium 3.5 - 5.1 mmol/L 4.1  3.8  4.3   Chloride 98 - 111 mmol/L 105  105  104   CO2 22 - 32 mmol/L 32  30  28   Calcium  8.9 - 10.3 mg/dL 9.1  8.8  9.2   Total Protein 6.5 - 8.1 g/dL 6.4  6.4    Total Bilirubin 0.0 - 1.2 mg/dL 0.8  0.9    Alkaline Phos 38 - 126 U/L 78  70    AST 15 - 41 U/L 24  23    ALT 0 - 44 U/L 28  27           05/24/2019 Bone Marrow Biopsy    04/16/2019 Surgical Pathology:   Surgical Pathology  CASE: [id number]  PATIENT: Jeshawn Alarie  Bone  Marrow Report      Clinical History: Multiple myeloma, remission status unspecified (HCC)  (BH)      DIAGNOSIS:   BONE MARROW,  ASPIRATE, CLOT, CORE:  -Variably cellular bone marrow with trilineage hematopoiesis and 2%  plasma cells  -See comment   PERIPHERAL BLOOD:  -Slight thrombocytopenia   COMMENT:   The bone marrow is variably cellular with trilineage hematopoiesis  including abundant megakaryocytes in the more cellular areas.  In this  background, the plasma cells represent 2% of all cells in the aspirate  with interstitial cells and a few very minute clusters in the  clot/biopsy sections.  The latter in particular display kappa light  chain excess/restriction most suggestive of minimal residual plasma cell  neoplasm despite limited findings.  Correlation with cytogenetic and  FISH studies is recommended.    RADIOGRAPHIC STUDIES: I have personally reviewed the radiological images as listed and agreed with the findings in the report. NM PET Image Restage (PS) Whole Body Result Date: 06/04/2024 EXAM: PET CT WHOLE BODY 05/31/2024 09:42:47 AM TECHNIQUE: RADIOPHARMACEUTICAL: 7.99 mCi F-18 FDG Uptake time 60 minutes. Glucose level 93 mg/dl. PET imaging was acquired from the skull vertex through the feet. Non-contrast enhanced computed tomography was obtained for attenuation correction and anatomic localization. COMPARISON: 10/15/2021 CLINICAL HISTORY: Hematologic malignancy, assess treatment response; evaluation for progrression of myeloma. Multiple myeloma not having achieved remission. FINDINGS: HEAD AND NECK: No cervical nodal hypermetabolism. No cervical adenopathy. Bilateral carotid atherosclerosis. CHEST: No thoracic nodal or pulmonary parenchymal hypermetabolism. Status post TAVR. Upper normal ascending aortic caliber at 3.9 cm. Aortic and coronary artery calcification. Centrilobular emphysema. A 2 mm subpleural right upper lobe pulmonary nodule is unchanged and below PET  resolution. ABDOMEN AND PELVIS: No abdominal or pelvic nodal or parenchymal hypermetabolism. Normal adrenal glands. Bilateral low density renal lesions are likely cysts and do not warrant specific imaging follow up. Abdominal aortic atherosclerosis. Large colonic stool burden. Mild prostatomegaly. BONES AND SOFT TISSUE: New multifocal hypermetabolic osseous foci. Example posterior right third rib at SUV 5.0 on 73/4. Within the anterior second left rib at 1.2 cm and SUV 12.3 on 87/4. The right supra-acetabular/iliac lesion is no longer tracer avid, measuring SUV 1.6 today. Relatively similar in size and CT morphology at maximally 5.9 cm. No abnormal activity within the extremities. Left proximal humerus fixation. Osteopenia. IMPRESSION: 1. Findings consistent with progression of multiple myeloma with new multifocal hypermetabolic osseous lesions. 2. No evidence of hypermetabolic extraosseous myeloma. Electronically signed by: Rockey Kilts MD 06/04/2024 10:44 AM EDT RP Workstation: [id number]   ASSESSMENT & PLAN:   83 y.o. male with:  #1 Plasma cell myeloma-currently in relapse  03/28/2019 MRI pelvis w/wo contrast revealed 1. Destructive bone lesions as detailed above. Findings most consistent with metastatic disease. PET-CT may be helpful for further evaluation and to establish a primary tumor. The right pelvic bone lesions should be amenable to image guided biopsy but a PET scan may demonstrate easier/safer biopsy sites. 2. No intrapelvic mass or adenopathy. 3. Benign intraosseous lipoma involving the left anterior superior acetabulum.  04/06/2019 PET whole body revealed 1. Diffuse osseous metastatic disease as detailed above without findings for a primary neoplasm in the chest, abdomen or pelvis. The large destructive lesion involving the right ischium should be amenable to image guided biopsy. 2. Two small retroperitoneal lymph nodes and 1 small right obturator node showing hypermetabolism.    04/16/2019 Posterior right pelvis bone biopsy revealed "PLASMA CELL NEOPLASM"  05/24/2019 Bone Marrow Biopsy revealed BONE MARROW: - CELLULAR MARROW WITH INVOLVEMENT BY PLASMA CELL NEOPLASM (20%) PERIPHERAL BLOOD: - MORPHOLOGICALLY UNREMARKABLE  05/24/2019 FISH Panel revealed ABNORMAL result with 11q+, 14q+ and +17  #  2 Severe aortic stenosis with bicuspid aortic valve -10/26/2018 ECHO revealed AVA at 0.8 cm2 and LV EF of 60-65% -05/08/2019 pt had a Transfemoral Transcatheter Aortic Valve Replacement  PET CT scan from 2/2 which shows There is persistent low level radiotracer uptake associated with the large lucent lesion within the superior right acetabulum. Additionally, there is a new focal area of increased radiotracer uptake above the background low level activity within this lesion with SUV max of 3.92. On the previous exam there was relatively homogeneous low level uptake within this lesion within SUV max of 2.43. Imaging findings are concerning for residual metabolically active tumor. Patient has seen radiation oncology since his last visit and they have decided to proceed with involved site radiation therapy to the FDG avid lesion to reduce the risk of fracture.  -He had his bone marrow biopsy on 11/03/2021 which shows 2% abnormal plasma cells.  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  No new anemia.  CMP with Creatinine 1.22 and Calcium  9.1.  M protein stable at 0.3 g/dL Kappa/Lambda Lights Chains with Kappa Light Chains at 477.8   - Reviewed 06/04/2024 PET scan:   1. Findings consistent with progression of multiple myeloma with new multifocal hypermetabolic osseous lesions.  2. No evidence of hypermetabolic extraosseous myeloma.  - Based on labs  and imaging, there is evidence of minor progression of Multiple Myeloma. - He has tolerated Revlimid  10 MG with no new or major toxicities.   - Discussed the possibility of changing treatments to avoid bone lesions, kidney damage, or decreased blood counts; recommended switching to Daratumumab, which would be started at a frequency of once weekly >> q2 weeks >> once monthly. - Reviewed risk VS benefits of Daratumumab with patient. - Would like to obtain BMBX as his last was in 2020. - Will have to restart Acyclovir  to prevent viral infections.  - Obtain BMBX in 1-2 weeks - Start Daratumumab following BMBX  -continue Zometa  every 12 weeks  2 - Continue regular follow-ups with Cardiologist for management. - Continue Eliquis    3 - Continue Oxycodone  as needed for back pain - Continue using cane for improved ambulation to prevent falls  - Vaccine Counseling provided: informed patient he is clear to update his Influenza vaccine.  FOLLOW UP: CT bone marrow biopsy in 1 weeks Start Dara Faspro instead of Revlinid in 2 week   .The total time spent in the appointment was 32 minutes* .  All of the patient's questions were answered with apparent satisfaction. The patient knows to call the clinic with any problems, questions or concerns.   Emaline Saran MD MS AAHIVMS [facility] Hematology/Oncology Physician [facility]  .*Total Encounter Time as defined by the Centers for Medicare and Medicaid Services includes, in addition to the face-to-face time of a patient visit (documented in the note above) non-face-to-face time: obtaining and reviewing outside history, ordering and reviewing medications, tests or procedures, care coordination (communications with other health care professionals or caregivers) and documentation in the medical record.  I, Damien Blanks ,acting as a Neurosurgeon for Emaline Saran, MD.,have documented all relevant documentation on the behalf of Emaline Saran, MD,as directed by   Emaline Saran, MD while in the presence of Emaline Saran, MD.  I have reviewed  the above documentation for accuracy and completeness, and I agree with the above. Emaline Candida Saran MD

## 2024-06-21 ENCOUNTER — Encounter: Payer: Self-pay | Admitting: Hematology

## 2024-06-21 ENCOUNTER — Encounter (HOSPITAL_COMMUNITY)

## 2024-06-21 MED ORDER — DEXAMETHASONE 4 MG PO TABS
4.0000 mg | ORAL_TABLET | Freq: Every day | ORAL | 4 refills | Status: AC
Start: 2024-06-21 — End: ?

## 2024-06-21 MED ORDER — LIDOCAINE-PRILOCAINE 2.5-2.5 % EX CREA: TOPICAL_CREAM | CUTANEOUS | 3 refills | Status: AC

## 2024-06-21 MED ORDER — ACYCLOVIR 400 MG PO TABS: 400.0000 mg | ORAL_TABLET | Freq: Two times a day (BID) | ORAL | 11 refills | Status: AC

## 2024-06-21 MED ORDER — ONDANSETRON HCL 8 MG PO TABS: 8.0000 mg | ORAL_TABLET | Freq: Three times a day (TID) | ORAL | 1 refills | Status: AC | PRN

## 2024-06-21 MED ORDER — PROCHLORPERAZINE MALEATE 10 MG PO TABS: 10.0000 mg | ORAL_TABLET | Freq: Four times a day (QID) | ORAL | 1 refills | Status: AC | PRN

## 2024-06-21 NOTE — Progress Notes (Signed)
 DISCONTINUE ON PATHWAY REGIMEN - Multiple Myeloma and Other Plasma Cell Dyscrasias     A cycle is every 21 days:     Bortezomib       Lenalidomide       Dexamethasone    **Always confirm dose/schedule in your pharmacy ordering system**  PRIOR TREATMENT: [id number]: VRd (Bortezomib  1.3 mg/m2 Subcut D1, 4, 8, 11 + Lenalidomide  25 mg + Dexamethasone  20 mg) q21 Days x 4-6 Cycles Maximum Prior to Stem Cell Harvest  START ON PATHWAY REGIMEN - Multiple Myeloma and Other Plasma Cell Dyscrasias     Cycles 1 and 2: A cycle is every 28 days:     Daratumumab and hyaluronidase-fihj    Cycles 3 through 6: A cycle is every 28 days:     Daratumumab and hyaluronidase-fihj    Cycles 7 and beyond: A cycle is every 28 days:     Daratumumab and hyaluronidase-fihj   **Always confirm dose/schedule in your pharmacy ordering system**  Patient Characteristics: Multiple Myeloma, Relapsed / Refractory, Second through Fourth Lines of Therapy, Not a Candidate for CAR T-cell Therapy, Frail or Not a Candidate for Triplet Therapy Disease Classification: Multiple Myeloma Therapeutic Status: Relapsed R2-ISS Staging: II Line of Therapy: Second Line Intent of Therapy: Non-Curative / Palliative Intent, Discussed with Patient

## 2024-06-22 ENCOUNTER — Other Ambulatory Visit: Payer: Self-pay

## 2024-07-10 ENCOUNTER — Other Ambulatory Visit: Payer: Self-pay | Admitting: Hematology

## 2024-07-10 DIAGNOSIS — C9000 Multiple myeloma not having achieved remission: Secondary | ICD-10-CM

## 2024-07-12 ENCOUNTER — Other Ambulatory Visit: Payer: Self-pay | Admitting: Radiology

## 2024-07-12 DIAGNOSIS — C9000 Multiple myeloma not having achieved remission: Secondary | ICD-10-CM

## 2024-07-12 NOTE — H&P (Signed)
 Chief Complaint: Multiple myeloma, joint/bone pain; referred for image guided bone marrow biopsy to evaluate for myeloma progression  Referring Provider(s): Kale,G  Supervising Physician: Jenna Hacker  Patient Status: [facility] - Out-pt  History of Present Illness: [name] [name] is an 83 y.o. male with PMH sig for ascending aortic aneurysm, bicuspid aortic valve , CHF, parox A-fib, fatty liver, GERD, hypertension, hyperlipidemia, aortic stenosis with prior TAVR, osteoarthritis, and multiple myeloma initially diagnosed in 2020 .  He presents now with PET scan findings concerning for progression of myeloma with new multifocal hypermetabolic osseous lesions.  He is scheduled today for image guided bone marrow biopsy to evaluate for myeloma progression.  He is known to IR team from posterior acetabulum lesion biopsy in 2020, bone marrow biopsies  in 2020/2021/2023.     Patient is Full Code  Past Medical History:  Diagnosis Date   Ascending aortic aneurysm    Bicuspid aortic valve    Cancer (HCC)    CHF NYHA class I (no symptoms from ordinary activities), acute, diastolic (HCC)    Dysrhythmia 7990   A fib   Fatty liver    mild   Fracture    left proximal humerus   GERD (gastroesophageal reflux disease)    GI bleeding 07/21/2018   post polypectomy   Hemorrhoids    HTN (hypertension)    Hypercholesteremia    Hypokalemia    Internal hemorrhoids    LBP (low back pain)    Moderate aortic stenosis    Osteoarthritis    Paroxysmal atrial fibrillation (HCC)    a. new onset Afib in 07/2008. He underwent ibutilide cardioversion successfully. b. Recurrence 01/2013 s/p TEE/DCCV - was on Xarelto  but he stopped it as he was convinced it was causing joint pn. c. Recurrence 01/2016 - spont conv to NSR. Pt took Eliquis  x1 mo then declined further anticoag. d. Recurrence 07/2016.   Pneumonia    Tubular adenoma of colon     Past Surgical History:  Procedure Laterality Date   BACK SURGERY  x12  years ago   CARDIAC CATHETERIZATION  2020   CARDIAC VALVE REPLACEMENT  2020   CARDIOVERSION N/A 01/26/2013   Procedure: CARDIOVERSION;  Surgeon: Ezra GORMAN Shuck, MD;  Location: [facility] ENDOSCOPY;  Service: Cardiovascular;  Laterality: N/A;   CARDIOVERSION N/A 10/28/2017   Procedure: CARDIOVERSION;  Surgeon: Shuck Ezra GORMAN, MD;  Location: [facility] ENDOSCOPY;  Service: Cardiovascular;  Laterality: N/A;   CARDIOVERSION N/A 03/03/2018   Procedure: CARDIOVERSION;  Surgeon: Pietro Redell GORMAN, MD;  Location: [facility] ENDOSCOPY;  Service: Cardiovascular;  Laterality: N/A;   CARDIOVERSION N/A 09/19/2019   Procedure: CARDIOVERSION;  Surgeon: Shuck Ezra GORMAN, MD;  Location: [facility] ENDOSCOPY;  Service: Cardiovascular;  Laterality: N/A;   COLONOSCOPY     COLONOSCOPY  07/17/2018   at [facility]   HEMORRHOID SURGERY     INGUINAL HERNIA REPAIR Left 03/18/2020   Procedure: OPEN LEFT INGUINAL HERNIA REPAIR;  Surgeon: Ethyl Lenis, MD;  Location: [facility] OR;  Service: General;  Laterality: Left;   LUMBAR LAMINECTOMY     ORIF HUMERUS FRACTURE Left 01/15/2020   Procedure: OPEN REDUCTION INTERNAL FIXATION (ORIF) PROXIMAL HUMERUS FRACTURE;  Surgeon: Sharl Selinda Dover, MD;  Location: [facility] OR;  Service: Orthopedics;  Laterality: Left;   POLYPECTOMY     RIGHT HEART CATH N/A 08/20/2019   Procedure: RIGHT HEART CATH;  Surgeon: Shuck Ezra GORMAN, MD;  Location: [facility] INVASIVE CV LAB;  Service: Cardiovascular;  Laterality: N/A;   RIGHT/LEFT HEART CATH AND CORONARY ANGIOGRAPHY  N/A 03/07/2019   Procedure: RIGHT/LEFT HEART CATH AND CORONARY ANGIOGRAPHY;  Surgeon: Verlin Lonni BIRCH, MD;  Location: [facility] INVASIVE CV LAB;  Service: Cardiovascular;  Laterality: N/A;   TAVAR  04/2019   TEE WITHOUT CARDIOVERSION N/A 01/26/2013   Procedure: TRANSESOPHAGEAL ECHOCARDIOGRAM (TEE);  Surgeon: Ezra GORMAN Shuck, MD;  Location: [facility] ENDOSCOPY;  Service: Cardiovascular;  Laterality: N/A;   TEE WITHOUT CARDIOVERSION N/A 10/28/2017   Procedure: TRANSESOPHAGEAL ECHOCARDIOGRAM  (TEE);  Surgeon: Shuck Ezra GORMAN, MD;  Location: [facility] ENDOSCOPY;  Service: Cardiovascular;  Laterality: N/A;   TEE WITHOUT CARDIOVERSION N/A 05/08/2019   Procedure: TRANSESOPHAGEAL ECHOCARDIOGRAM (TEE);  Surgeon: Verlin Lonni BIRCH, MD;  Location: [facility] INVASIVE CV LAB;  Service: Open Heart Surgery;  Laterality: N/A;   TEE WITHOUT CARDIOVERSION N/A 09/19/2019   Procedure: TRANSESOPHAGEAL ECHOCARDIOGRAM (TEE);  Surgeon: Shuck Ezra GORMAN, MD;  Location: [facility] ENDOSCOPY;  Service: Cardiovascular;  Laterality: N/A;   TRANSCATHETER AORTIC VALVE REPLACEMENT, TRANSFEMORAL N/A 05/08/2019   Procedure: TRANSCATHETER AORTIC VALVE REPLACEMENT, TRANSFEMORAL;  Surgeon: Verlin Lonni BIRCH, MD;  Location: [facility] INVASIVE CV LAB;  Service: Open Heart Surgery;  Laterality: N/A;    Allergies: Xarelto  [rivaroxaban ], Corticosteroids, Ramipril , Zolpidem, and Benazepril   Medications: Prior to Admission medications   Medication Sig Start Date End Date Taking? Authorizing Provider  acyclovir  (ZOVIRAX ) 400 MG tablet Take 1 tablet (400 mg total) by mouth 2 (two) times daily. 06/21/24   Onesimo Emaline Brink, MD  amiodarone  (PACERONE ) 200 MG tablet Take 1 tablet (200 mg total) by mouth daily. 03/21/24   Shuck Ezra GORMAN, MD  apixaban  (ELIQUIS ) 5 MG TABS tablet Take 1 tablet (5 mg total) by mouth 2 (two) times daily. 05/23/24   Shuck Ezra GORMAN, MD  aspirin  81 MG chewable tablet CHEW 1 TABLET BY MOUTH DAILY. 12/17/22   McLean, Dalton S, MD  Carboxymethylcellul-Glycerin (LUBRICATING EYE DROPS OP) Place 1 drop into both eyes daily as needed (dry eyes).    [provider]  Cholecalciferol  (VITAMIN D ) 50 MCG (2000 UT) tablet Take 2,000 Units by mouth daily.    [provider]  dexamethasone  (DECADRON ) 4 MG tablet Take 1 tablet (4 mg total) by mouth daily. Take for 2 days starting the night of chemotherapy. 06/21/24   Onesimo Emaline Brink, MD  diclofenac  Sodium (VOLTAREN ) 1 % GEL Apply 1 application topically 2 (two) times  daily as needed (pain.).    [provider]  gabapentin  (NEURONTIN ) 300 MG capsule TAKE 1 CAPSULE BY MOUTH EVERYDAY AT BEDTIME 02/16/24   Kale, Gautam Kishore, MD  ipratropium (ATROVENT ) 0.06 % nasal spray Place 2 sprays into the nose 3 (three) times daily. 08/25/23 08/24/24  [name], Karlynn GAILS, MD  lenalidomide  (REVLIMID ) 10 MG capsule TAKE 1 CAPSULE BY MOUTH 1 TIME A DAY FOR 21 DAYS ON THEN 7 DAYS OFF 07/10/24   Onesimo Emaline Brink, MD  lidocaine -prilocaine (EMLA) cream Apply to affected area once 06/21/24   Kale, Gautam Kishore, MD  Magnesium  Oxide -Mg Supplement 250 MG TABS Take 250 mg by mouth daily.    [provider]  metoprolol  succinate (TOPROL -XL) 25 MG 24 hr tablet TAKE 1 TABLET (25 MG TOTAL) BY MOUTH AT BEDTIME 10/14/23   Shuck Ezra GORMAN, MD  metroNIDAZOLE  (METROCREAM ) 0.75 % cream Apply 1 application  topically 2 (two) times daily as needed (rash).    [provider]  ondansetron  (ZOFRAN ) 8 MG tablet Take 1 tablet (8 mg total) by mouth every 8 (eight) hours as needed for nausea or vomiting. 06/21/24  Onesimo Emaline Brink, MD  oxyCODONE  (OXY IR/ROXICODONE ) 5 MG immediate release tablet Take 1-2 tablets (5-10 mg total) by mouth 2 (two) times daily as needed for severe pain (pain score 7-10). 06/14/24   [name], Aleksei V, MD  prochlorperazine  (COMPAZINE ) 10 MG tablet Take 1 tablet (10 mg total) by mouth every 6 (six) hours as needed for nausea or vomiting. 06/21/24   Onesimo Emaline Brink, MD  senna-docusate (SENOKOT-S) 8.6-50 MG tablet Take 2 tablets by mouth daily as needed for mild constipation or moderate constipation.    [provider]  spironolactone  (ALDACTONE ) 25 MG tablet TAKE 1 TABLET BY MOUTH EVERY DAY 10/04/23   [name], MD  Vitamin D , Ergocalciferol , (DRISDOL ) 1.25 MG (50000 UNIT) CAPS capsule TAKE 1 CAPSULE BY MOUTH ONE TIME PER WEEK 05/15/24   Onesimo Emaline Brink, MD     Family History  Problem Relation Age of Onset   Colon cancer  Mother 1   Hypertension Other    Coronary artery disease Neg Hx    Colon polyps Neg Hx    Esophageal cancer Neg Hx    Rectal cancer Neg Hx    Stomach cancer Neg Hx     Social History   Socioeconomic History   Marital status: Married    Spouse name: Not on file   Number of children: 0   Years of education: Not on file   Highest education level: Not on file  Occupational History   Occupation: Retired Garment/textile Technologist: [employer]  Tobacco Use   Smoking status: Never   Smokeless tobacco: Never  Vaping Use   Vaping status: Never Used  Substance and Sexual Activity   Alcohol use: Not Currently   Drug use: No   Sexual activity: Yes  Other Topics Concern   Not on file  Social History Narrative   Patient lives in [place] w/ his wife. 2025/   He is a native of [place]. 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   Right-handed   Caffeine: 2 cups coffee per day   Two story home   Social Drivers of Health   Financial Resource Strain: Low Risk  (06/14/2024)   Overall Financial Resource Strain (CARDIA)    Difficulty of Paying Living Expenses: Not hard at all  Food Insecurity: No Food Insecurity (06/14/2024)   Hunger Vital Sign    Worried About Running Out of Food in the Last Year: Never true    Ran Out of Food in the Last Year: Never true  Transportation Needs: No Transportation Needs (06/14/2024)   PRAPARE - Administrator, Civil Service (Medical): No    Lack of Transportation (Non-Medical): No  Physical Activity: Inactive (06/14/2024)   Exercise Vital Sign    Days of Exercise per Week: 0 days    Minutes of Exercise per Session: 0 min  Stress: No Stress Concern Present (06/14/2024)   [employer] of Occupational Health - Occupational Stress Questionnaire    Feeling of Stress: Not at all  Social Connections: Moderately Integrated (06/14/2024)   Social Connection and Isolation Panel    Frequency of Communication  with Friends and Family: Once a week    Frequency of Social Gatherings with Friends and Family: Once a week    Attends Religious Services: More than 4 times per year    Active Member of [employer] or Organizations: Yes    Attends Banker Meetings: More than  4 times per year    Marital Status: Married       Review of Systems; denies fever,HA,CP,dyspnea, cough, abd pain,N/V or bleeding; he does have back pain  Vital Signs: Vitals:   07/16/24 0742 07/16/24 0800  BP: 112/67   Pulse: (!) 56   Resp: 16   Temp: 97.6 F (36.4 C)   SpO2: 99% 99%     Advance Care Plan: no documents on file    Physical Exam: awake/alert; chest- CTA bilat; heart- RRR; abd-soft,+BS,NT; no LE edema  Imaging: No results found.  Labs:  CBC: Recent Labs    01/11/24 1217 03/02/24 1223 04/13/24 0938 06/08/24 0944  WBC 3.2* 3.2* 3.9* 3.5*  HGB 14.4 16.7 14.0 14.4  HCT 44.2 51.2 42.8 43.8  PLT 121* 112* 99* 85*    COAGS: No results for input(s): INR, APTT in the last 8760 hours.  BMP: Recent Labs    01/11/24 1217 03/02/24 1223 04/13/24 0938 06/08/24 0944  NA 138 139 140 140  K 4.2 4.3 3.8 4.1  CL 105 104 105 105  CO2 29 28 30  32  GLUCOSE 100* 102* 96 99  BUN 17 15 18 18   CALCIUM  9.0 9.2 8.8* 9.1  CREATININE 1.05 1.23 1.09 1.22  GFRNONAA >60 58* >60 58*    LIVER FUNCTION TESTS: Recent Labs    12/26/23 1118 01/11/24 1217 04/13/24 0938 06/08/24 0944  BILITOT 0.5 0.5 0.9 0.8  AST 32 23 23 24   ALT 40 27 27 28   ALKPHOS 58 76 70 78  PROT 6.1* 6.4* 6.4* 6.4*  ALBUMIN  3.9 4.3 4.2 4.4    TUMOR MARKERS: No results for input(s): AFPTM, CEA, CA199, CHROMGRNA in the last 8760 hours.  Assessment and Plan: 83 y.o. male with PMH sig for ascending aortic aneurysm, bicuspid aortic valve , CHF, parox A-fib, fatty liver, GERD, hypertension, hyperlipidemia, aortic stenosis with prior TAVR, osteoarthritis, and multiple myeloma initially diagnosed in 2020 .  He presents  now with PET scan findings concerning for progression of myeloma with new multifocal hypermetabolic osseous lesions.  He is scheduled today for image guided bone marrow biopsy to evaluate for myeloma progression.  He is known to IR team from posterior acetabulum lesion biopsy in 2020, bone marrow biopsies  in 2020/2021/2023.Risks and benefits of procedure was discussed with the patient /spouse including, but not limited to bleeding, infection, damage to adjacent structures or low yield requiring additional tests.  All of the questions were answered and there is agreement to proceed.  Consent signed and in chart.    Thank you for allowing our service to participate in [name] [name] 's care.  Electronically Signed: D. Franky Rakers, PA-C   07/12/2024, 3:50 PM      I spent a total of   20 minutes  in face to face in clinical consultation, greater than 50% of which was counseling/coordinating care for image guided bone marrow biopsy

## 2024-07-16 ENCOUNTER — Ambulatory Visit (HOSPITAL_COMMUNITY)
Admission: RE | Admit: 2024-07-16 | Discharge: 2024-07-16 | Disposition: A | Source: Ambulatory Visit | Attending: Hematology

## 2024-07-16 ENCOUNTER — Other Ambulatory Visit: Payer: Self-pay

## 2024-07-16 ENCOUNTER — Ambulatory Visit (HOSPITAL_COMMUNITY)
Admission: RE | Admit: 2024-07-16 | Discharge: 2024-07-16 | Disposition: A | Discharge status: Home / Self Care | Source: Ambulatory Visit | Attending: Hematology | Admitting: Hematology

## 2024-07-16 ENCOUNTER — Encounter (HOSPITAL_COMMUNITY): Payer: Self-pay

## 2024-07-16 DIAGNOSIS — D61818 Other pancytopenia: Secondary | ICD-10-CM | POA: Insufficient documentation

## 2024-07-16 DIAGNOSIS — K760 Fatty (change of) liver, not elsewhere classified: Secondary | ICD-10-CM | POA: Diagnosis not present

## 2024-07-16 DIAGNOSIS — I110 Hypertensive heart disease with heart failure: Secondary | ICD-10-CM | POA: Diagnosis not present

## 2024-07-16 DIAGNOSIS — I480 Paroxysmal atrial fibrillation: Secondary | ICD-10-CM | POA: Insufficient documentation

## 2024-07-16 DIAGNOSIS — Z952 Presence of prosthetic heart valve: Secondary | ICD-10-CM | POA: Insufficient documentation

## 2024-07-16 DIAGNOSIS — I7121 Aneurysm of the ascending aorta, without rupture: Secondary | ICD-10-CM | POA: Insufficient documentation

## 2024-07-16 DIAGNOSIS — C9000 Multiple myeloma not having achieved remission: Secondary | ICD-10-CM | POA: Insufficient documentation

## 2024-07-16 DIAGNOSIS — I509 Heart failure, unspecified: Secondary | ICD-10-CM | POA: Insufficient documentation

## 2024-07-16 DIAGNOSIS — E785 Hyperlipidemia, unspecified: Secondary | ICD-10-CM | POA: Insufficient documentation

## 2024-07-16 DIAGNOSIS — Z8249 Family history of ischemic heart disease and other diseases of the circulatory system: Secondary | ICD-10-CM | POA: Diagnosis not present

## 2024-07-16 DIAGNOSIS — Z1379 Encounter for other screening for genetic and chromosomal anomalies: Secondary | ICD-10-CM | POA: Insufficient documentation

## 2024-07-16 DIAGNOSIS — D6489 Other specified anemias: Secondary | ICD-10-CM | POA: Diagnosis not present

## 2024-07-16 DIAGNOSIS — R799 Abnormal finding of blood chemistry, unspecified: Secondary | ICD-10-CM | POA: Diagnosis not present

## 2024-07-16 LAB — CBC WITH DIFFERENTIAL/PLATELET
Abs Immature Granulocytes: 0.01 K/uL (ref 0.00–0.07)
Basophils Absolute: 0 K/uL (ref 0.0–0.1)
Basophils Relative: 1 %
Eosinophils Absolute: 0.2 K/uL (ref 0.0–0.5)
Eosinophils Relative: 9 %
HCT: 39.2 % (ref 39.0–52.0)
Hemoglobin: 12.7 g/dL — ABNORMAL LOW (ref 13.0–17.0)
Immature Granulocytes: 1 %
Lymphocytes Relative: 29 %
Lymphs Abs: 0.5 K/uL — ABNORMAL LOW (ref 0.7–4.0)
MCH: 31.3 pg (ref 26.0–34.0)
MCHC: 32.4 g/dL (ref 30.0–36.0)
MCV: 96.6 fL (ref 80.0–100.0)
Monocytes Absolute: 0.4 K/uL (ref 0.1–1.0)
Monocytes Relative: 20 %
Neutro Abs: 0.8 K/uL — ABNORMAL LOW (ref 1.7–7.7)
Neutrophils Relative %: 40 %
Platelets: 84 K/uL — ABNORMAL LOW (ref 150–400)
RBC: 4.06 MIL/uL — ABNORMAL LOW (ref 4.22–5.81)
RDW: 13.4 % (ref 11.5–15.5)
Smear Review: NORMAL
WBC: 1.8 K/uL — ABNORMAL LOW (ref 4.0–10.5)
nRBC: 0 % (ref 0.0–0.2)

## 2024-07-16 MED ORDER — MIDAZOLAM HCL (PF) 2 MG/2ML IJ SOLN
INTRAMUSCULAR | Status: AC | PRN
  Administered 2024-07-16: 1 mg via INTRAVENOUS

## 2024-07-16 MED ORDER — FENTANYL CITRATE (PF) 100 MCG/2ML IJ SOLN
INTRAMUSCULAR | Status: AC | PRN
Start: 2024-07-16 — End: 2024-07-16
  Administered 2024-07-16: 50 ug via INTRAVENOUS

## 2024-07-16 MED ORDER — SODIUM CHLORIDE 0.9 % IV SOLN: INTRAVENOUS | Status: DC

## 2024-07-16 MED ORDER — MIDAZOLAM HCL (PF) 2 MG/2ML IJ SOLN
INTRAMUSCULAR | Status: AC | PRN
  Administered 2024-07-16: .5 mg via INTRAVENOUS

## 2024-07-16 MED ORDER — MIDAZOLAM HCL 2 MG/2ML IJ SOLN
INTRAMUSCULAR | Status: AC
  Filled 2024-07-16: qty 2

## 2024-07-16 MED ORDER — FENTANYL CITRATE (PF) 100 MCG/2ML IJ SOLN
INTRAMUSCULAR | Status: AC
  Filled 2024-07-16: qty 2

## 2024-07-16 NOTE — Procedures (Signed)
 Interventional Radiology Procedure Note  Procedure: CT Guided Biopsy of bone marrow  Complications: None  Estimated Blood Loss: < 10 mL  Findings: 13 G core biopsy of right iliac bone performed under CT guidance.  Aspirate and core samples obtained and sent to Pathology.  [name] DELENA Banner, MD

## 2024-07-18 ENCOUNTER — Ambulatory Visit (HOSPITAL_COMMUNITY)
Admission: RE | Admit: 2024-07-18 | Discharge: 2024-07-18 | Disposition: A | Discharge status: Home / Self Care | Source: Ambulatory Visit | Attending: Cardiology | Admitting: Cardiology

## 2024-07-18 VITALS — BP 134/76 | HR 62 | Wt 163.6 lb

## 2024-07-18 DIAGNOSIS — I493 Ventricular premature depolarization: Secondary | ICD-10-CM | POA: Insufficient documentation

## 2024-07-18 DIAGNOSIS — M549 Dorsalgia, unspecified: Secondary | ICD-10-CM | POA: Insufficient documentation

## 2024-07-18 DIAGNOSIS — I480 Paroxysmal atrial fibrillation: Secondary | ICD-10-CM | POA: Diagnosis not present

## 2024-07-18 DIAGNOSIS — Z7982 Long term (current) use of aspirin: Secondary | ICD-10-CM | POA: Insufficient documentation

## 2024-07-18 DIAGNOSIS — Z79899 Other long term (current) drug therapy: Secondary | ICD-10-CM | POA: Diagnosis not present

## 2024-07-18 DIAGNOSIS — I110 Hypertensive heart disease with heart failure: Secondary | ICD-10-CM | POA: Diagnosis not present

## 2024-07-18 DIAGNOSIS — G629 Polyneuropathy, unspecified: Secondary | ICD-10-CM | POA: Insufficient documentation

## 2024-07-18 DIAGNOSIS — I7121 Aneurysm of the ascending aorta, without rupture: Secondary | ICD-10-CM | POA: Diagnosis not present

## 2024-07-18 DIAGNOSIS — I5032 Chronic diastolic (congestive) heart failure: Secondary | ICD-10-CM | POA: Diagnosis not present

## 2024-07-18 DIAGNOSIS — Z7901 Long term (current) use of anticoagulants: Secondary | ICD-10-CM | POA: Insufficient documentation

## 2024-07-18 DIAGNOSIS — Z953 Presence of xenogenic heart valve: Secondary | ICD-10-CM | POA: Insufficient documentation

## 2024-07-18 DIAGNOSIS — C9000 Multiple myeloma not having achieved remission: Secondary | ICD-10-CM | POA: Insufficient documentation

## 2024-07-18 DIAGNOSIS — G8929 Other chronic pain: Secondary | ICD-10-CM | POA: Diagnosis not present

## 2024-07-18 LAB — SURGICAL PATHOLOGY

## 2024-07-18 NOTE — Progress Notes (Signed)
 Date:  07/18/2024  ID:  [name] [name], DOB 1940/11/28, MRN [id number]  Provider location: [facility] Type of Visit: Established patient   PCP:  [name], Karlynn GAILS, MD  Cardiologist: Dr. Rolan  Chief Complaint: Atrial fibrillation   HPI: [name] [name] is a 83 y.o. male who has history of HTN, aortic stenosis and paroxysmal atrial fibrillation. He was hospitalized with atrial fibrillation/RVR in 5/14.  He had TEE-guided cardioversion.  TEE showed bicuspid aortic valve with mild AS and a moderately dilated ascending aorta.  Most echo in 4/17 showed moderate aortic stenosis and MRA chest in 11/17 showed 4.1 cm ascending aorta.    He was on Xarelto  for anticoagulation but stopped it as he was convinced it was causing joint pains.  He then refused to start any other anticoagulation at that time.     In 5/17, he had been back in atrial fibrillation for several days and was symptomatic.  I started him on Eliquis  and planned TEE-guided DCCV given significant symptoms, but he converted back to NSR on his own.  He continued Eliquis  for about 1 month then stopped it on his own.     In 11/17, he was hospitalized with symptomatic atrial fibrillation with RVR.  I started him on diltiazem  CD and Eliquis  with plan for TEE-guided DCCV.  However, he converted back to NSR on his own. He stopped the diltiazem  but has continued the Eliquis .    He was admitted in 2/18 with fever, LUL PNA and left-sided pleural effusion.  Thoracentesis on left was suggestive of parapneumonic effusion.  He was in the hospital 11 days.  During that time, he went into atrial fibrillation with RVR.  He was started on amiodarone  and went back into NSR.  Amiodarone  was subsequently stopped.    Recurrent atrial fibrillation with RVR in 2/19, felt more fatigued.  He underwent TEE-guided DCCV back to NSR.    He had a lower GI bleed in 11/19 post-polypectomy.  He has since restarted on Eliquis .    Coronary  CTA was done in 2/20, this showed mild nonobstructive CAD.  Echo in 2/20 showed EF 60-65%, bicuspid aortic valve with severe AS.   LHC in 6/20 showed no significant coronary disease.  In 8/20, he had TAVR with Edwards Sapien 3 THV x 2 (valve in valve due to peri-valvular leak initially).  Post-procedure echoes have shown elevated gradient across the aortic valve though dimensionless index has only been in the mildly stenotic range.  Last echo in 9/20 showed mean aortic valve gradient 26 mmHg with dimensionless index 0.54.   Patient has additionally been diagnosed with multiple myeloma.  He was treated with Revlimid , Velcade , and prednisone .   He was admitted early in 1/21 with atrial fibrillation/RVR associated with ischemic colitis.  He underwent TEE-guided DCCV to NSR.   He was readmitted later in 1/21 with PNA.    He fell in 4/21, tripped and fractured his left proximal humerus.  He had ORIF in 5/21.   In 7/21, he had a left inguinal hernia repair.  Echo in 8/21 showed EF 60-65%, normal RV, ascending aorta 45 mm, stable TAVR valve with mean gradient 15 mmHg.   Echo in 8/22 showed EF 60-65% RV ok.  Bioprosthetic AVR s/p TAVR with mean gradient 13 IVC small.   Echo in 9/23 showed EF 65-70% with normal RV, PASP 34, s/p TAVR with mean gradient up to 19 mmHg.  Gated CT was then done  to look at the aortic valve more closely, this showed 4.4 cm ascending aorta with small PFO, HALT at aortic valve leaflet bases without significant restriction, this was consistent with leaflet thrombosis.   Follow up 10/23, Dr. 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  He suggested transition to warfarin with INR 2.5-3.5, but patient did not want to switch to warfarin.   He was in AF 08/01/22 and advised to start amiodarone  200 mg bid.  He had worsening SOB and CP and was seen in ED 08/06/22. ECG showed AF with controlled rate. Underwent DCCV  with restoration to NSR.  He stopped amiodarone  soon after that.   Echo in 11/24 showed EF 65-70%, RV normal, bioprosthetic aortic valve s/p TAVR with lower mean gradient at 13 mmHg, IVC normal.   Seen in the ED 12/26/23 for palpitations. Was in afib and cardioverted by EDP and sent home.   Patient was in AF at visit in 6/25, he was started back on amiodarone  and was brought back for DCCV.  When he presented for DCCV, he was back in NSR.   He returns today for followup of atrial fibrillation.  He was supposed to continue amiodarone  200 daily long-term, but he has stopped it again.  He is also not taking Toprol  XL.  He is in NSR today.  No palpitations.  No exertional dyspnea or chest pain.  He is limited by leg and back pain (chronic).  No orthopnea/PND. No lightheadedness. Weight down 5 lbs.   ECG (personally reviewed): NSR, LAFB, poor RWP  Labs (7/24): K 4.3, creatinine 1.43 Labs (6/25): hgb 16.7, K 4.3, creatinine 1.23 Labs (9/25): K 4.1, creatinine 1.22 Labs (11/25): WBCs 1.8, hgb 12.7, plts 84   Allergies (verified):  No Known Drug Allergies    Past Medical History:  1. Hypertension: ACEI cough. Edema with amlodipine .  2. Atrial fibrillation. The patient had new-onset atrial fibrillation in November 2009. He underwent ibutilide cardioversion successfully. He has had 1-2 episodes/year that are short-lived that likely are atrial fibrillation with RVR. He was admitted in 5/14 with atrial fibrillation/RVR and had TEE-guided DCCV.  Atrial fibrillation again in 5/17, converted back to NSR spontaneously.  CHADSVASC score 2.  - Atrial fibrillation 2/19 with TEE-guided DCCV.  - 1/21 TEE-guided DCCV - 11/23 DCCV - 4/25 DCCV 3. Hypercholesterolemia.  4. Bicuspid aortic valve disorder: Echo (7/11): EF 55-60%, mild LV hypertrophy, mild aortic stenosis with mean gradient 19 mmHg and peak gradient 36 mmHg.  TEE (5/14): EF 55%, mild LVH, bicuspid aortic valve with mild AS (mean gradient 13 mmHg),  ascending aorta 4.5 cm.  Echo (9/15) with EF 65-70%, moderate AS (mean gradient 23 mmHg), mild AI, ascending aorta 4.1 cm, mild MR.   - Echo (4/17) with EF 65-70%, moderate aortic stenosis with mean gradient 27 mmHg, PASP 31 mmHg, ascending aorta 4.4 cm.  - Echo (2/18) with EF 55-60%, bicuspid aortic valve with moderate aortic stenosis (underestimated gradient).  - TEE (2/19): EF 60-65%, moderate LVH, bicuspid aortic valve with moderate AS with mean gradient 28 mmHg and AVA 1.2 cm^2, 4.4 cm ascending aorta.  - Echo (2/20): EF 60-65%, mild LVH, bicuspid aortic valve with severe AS (mean gradient 48 mmHg, AVA 0.8 cm^2).  - TAVR 8/20 with valve in valve Edwards Sapien 3 THVs (because of peri-valvular leak after initial valve placed).  - Echo (9/20): EF > 65%, mild LVH, mild RV dilation with normal RV systolic  function, mean aortic valve gradient 26 mmHg with dimensionless index 0.54, IVC normal.  - TEE (1/21): EF 60-65%, moderate LVH, normal RV, s/p valve-in-valve TAVR with mean gradient 8 mmHg and no PVL.  - Echo (8/21): EF 60-65%, normal RV, ascending aorta 45 mm, stable TAVR valve with mean gradient 15 mmHg.  - Echo (8/22): EF 70-75%, s/p TAVR mean gradient 12 mmHg.  - Echo (9/23): EF 65-70% with normal RV, PASP 34, s/p TAVR with mean gradient up to 19 mmHg. - Gated CT (9/23) showed 4.4 cm ascending aorta with small PFO, HALT at aortic valve leaflet bases without significant restriction, this was consistent with leaflet thrombosis. - Echo (11/24): EF 65-70%, RV normal, bioprosthetic aortic valve s/p TAVR with lower mean gradient at 13 mmHg, IVC normal.  5. Osteoarthritis.  6. Low back pain.  7. Chest pain: ETT-myoview (12/11) with 10:51 exercise, no chest pain, no significant ST changes, EF 69%, no evidence for ischemia or infarction.  - Coronary CTA (2/20): calcium  score 41, nonobstructive CAD.  - LHC (6/20): No significant CAD.  8. Ascending aortic aneurysm: Associated with bicuspid aortic valve.   4.5 cm by TEE in 5/14.   MRA chest (6/14) with bicuspid aortic valve, 4.3 cm ascending aortic aneurysm.  MRA chest (10/15) with 4.2 cm ascending aorta (bicuspid aortic valve noted).  Echo (4/17) with ascending aorta diameter 4.4 cm.  - MRA chest (11/17) with 4.1 cm ascending aorta.  - Echo (2/19): 4.4 cm ascending aorta.  - Coronary CTA in 2/20 showed 4.3 cm ascending aorta.  - Echo (8/21) with 4.5 cm ascending aorta.  - Gated CT chest (9/23): 4.4 cm ascending aorta 9. Colonic polyps: GI bleeding in 11/19 s/p polypectomy.  10. Multiple myeloma.  11. Bradycardia 12. Thrombocytopenia: likely related to chemotherapy for multiple myeloma.  13. PVCs: Zio monitor in 10/20 showed 9.4% PVCs.  14. Chronic diastolic CHF: RHC (12/20) with mean RA 3, PA 22/2, mean PCWP 6, CI 2.74 15. Left inguinal hernia: s/p repair.    Current Outpatient Medications  Medication Sig Dispense Refill   apixaban  (ELIQUIS ) 5 MG TABS tablet Take 1 tablet (5 mg total) by mouth 2 (two) times daily. 180 tablet 0   aspirin  81 MG chewable tablet CHEW 1 TABLET BY MOUTH DAILY. 90 tablet 1   Carboxymethylcellul-Glycerin (LUBRICATING EYE DROPS OP) Place 1 drop into both eyes daily as needed (dry eyes).     Cholecalciferol  (VITAMIN D ) 50 MCG (2000 UT) tablet Take 2,000 Units by mouth daily.     dexamethasone  (DECADRON ) 4 MG tablet Take 1 tablet (4 mg total) by mouth daily. Take for 2 days starting the night of chemotherapy. 20 tablet 4   diclofenac  Sodium (VOLTAREN ) 1 % GEL Apply 1 application topically 2 (two) times daily as needed (pain.).     gabapentin  (NEURONTIN ) 300 MG capsule TAKE 1 CAPSULE BY MOUTH EVERYDAY AT BEDTIME 90 capsule 3   ipratropium (ATROVENT ) 0.06 % nasal spray Place 2 sprays into the nose 3 (three) times daily. 15 mL 2   lenalidomide  (REVLIMID ) 10 MG capsule TAKE 1 CAPSULE BY MOUTH 1 TIME A DAY FOR 21 DAYS ON THEN 7 DAYS OFF 21 capsule 0   lidocaine -prilocaine (EMLA) cream Apply to affected area once 30 g 3    Magnesium  Oxide -Mg Supplement 250 MG TABS Take 250 mg by mouth daily.     metroNIDAZOLE  (METROCREAM ) 0.75 % cream Apply 1 application  topically 2 (two) times daily as needed (rash).  ondansetron  (ZOFRAN ) 8 MG tablet Take 1 tablet (8 mg total) by mouth every 8 (eight) hours as needed for nausea or vomiting. 30 tablet 1   oxyCODONE  (OXY IR/ROXICODONE ) 5 MG immediate release tablet Take 1-2 tablets (5-10 mg total) by mouth 2 (two) times daily as needed for severe pain (pain score 7-10). 120 tablet 0   prochlorperazine  (COMPAZINE ) 10 MG tablet Take 1 tablet (10 mg total) by mouth every 6 (six) hours as needed for nausea or vomiting. 30 tablet 1   senna-docusate (SENOKOT-S) 8.6-50 MG tablet Take 2 tablets by mouth daily as needed for mild constipation or moderate constipation.     spironolactone  (ALDACTONE ) 25 MG tablet TAKE 1 TABLET BY MOUTH EVERY DAY 90 tablet 3   Vitamin D , Ergocalciferol , (DRISDOL ) 1.25 MG (50000 UNIT) CAPS capsule TAKE 1 CAPSULE BY MOUTH ONE TIME PER WEEK 12 capsule 3   acyclovir  (ZOVIRAX ) 400 MG tablet Take 1 tablet (400 mg total) by mouth 2 (two) times daily. (Patient not taking: Reported on 07/18/2024) 60 tablet 11   No current facility-administered medications for this encounter.   Allergies:  Xarelto  [rivaroxaban ], Corticosteroids, Ramipril , Zolpidem, and Benazepril    Social History:  The patient  reports that he has never smoked. He has never used smokeless tobacco. He reports that he does not currently use alcohol. He reports that he does not use drugs.   Family History:  The patient's family history includes Colon cancer (age of onset: 56) in his mother; Hypertension in an other family member.   ROS:  Please see the history of present illness.   All other systems are personally reviewed and negative.   Physical Exam:   BP 134/76   Pulse 62   Wt 74.2 kg (163 lb 9.6 oz)   SpO2 98%   BMI 24.16 kg/m  General: NAD, frail Neck: No JVD, no thyromegaly or thyroid   nodule.  Lungs: Clear to auscultation bilaterally with normal respiratory effort. CV: Nondisplaced PMI.  Heart regular S1/S2, no S3/S4, 2/6 early SEM RUSB  Trace ankle edema.  No carotid bruit.  Normal pedal pulses.  Abdomen: Soft, nontender, no hepatosplenomegaly, no distention.  Skin: Intact without lesions or rashes.  Neurologic: Alert and oriented x 3.  Psych: Normal affect. Extremities: No clubbing or cyanosis.  HEENT: Normal.   Recent Labs: 03/02/2024: Magnesium  2.2 06/08/2024: ALT 28; BUN 18; Creatinine 1.22; Potassium 4.1; Sodium 140 07/16/2024: Hemoglobin 12.7; Platelets 84  Personally reviewed   Wt Readings from Last 3 Encounters:  07/18/24 74.2 kg (163 lb 9.6 oz)  06/15/24 75.1 kg (165 lb 9.6 oz)  06/14/24 74.4 kg (164 lb)    ASSESSMENT AND PLAN: 1. Atrial fibrillation: Paroxysmal. He is quite symptomatic when in atrial fibrillation.  Required DCCV 11/23 in ED and most recently 4/25. Back in AF in 6/25 but converted to NSR after starting amiodarone . He is in NSR today but has again stopped amiodarone  and Toprol  XL.    - He will stay off amiodarone  for now.  - Continue Eliquis .  2. Peripheral neuropathy: Patient developed a very painful peripheral neuropathy on Velcade .  This is still very symptomatic but seems to have slowly improved.  3. Bradycardia: After TAVR, patient had RBBB and LAFB with HR down to the 40s.  RBBB has resolved. He is off nodal blockers.  4. PVCs: Zio monitor in 10/20 showed 9.4% PVCs.  No PVCs noted on today's EKG - He has stopped amiodarone  each time it has been started, so I will leave  him off amiodarone .  5. HTN: BP controlled.  6. Bicuspid aortic valve disorder: S/P TAVR with 2 Edwards Sapien THVs (valve-in-valve due to peri-valvular leak after 1st valve placed). Post-op, mean gradient across the aortic valve was elevated, 26 mmHg in 9/20.  Dimensionless index, in 9/20 0.54, only suggests mild bioprosthetic aortic stenosis.  Echo in 1/21 showed mean  gradient down to 10 mmHg and TEE showed mean gradient 8 mmHg with no peri-valvular leakage.  Echo in 8/22 with mean gradient 12 mmHg.  Echo in 9/23 showed increase mean gradient up to 19 mmHg across bioprosthetic aortic valve.  Gated CT chest showed HALT at leaflet bases without restriction suggestive of leaflet thrombosis. Repeat echo in 11/24 showed EF 65-70%, RV normal, bioprosthetic aortic valve s/p TAVR with lower mean gradient at 13 mmHg, IVC normal.  - It was recommended that he transition from apixaban  to warfarin with INR 2.5-3.5. He did not want to take warfarin given need for monitoring and restriction on diet. Therefore, he will continue ASA 81 mg daily + Eliquis .  With lower mean gradient across the aortic valve on last echo, I think we can continue this plan.  - I will arrange for repeat echo this year to reassess aortic valve gradient.  7. Ascending aortic aneurysm: CT chest in 9/23 showed 4.4 cm ascending aorta. Echo 11/24 noted aortic root is normal in size and structure.  - Follow aorta by echo for now, he would likely be a poor candidate for ascending aorta replacement given frailty and age.  8. Chronic diastolic CHF:  NYHA Class II, confounded by age and recurrent Afib. Euvolemic on exam - does not need loop diuretic currently  9. Multiple myeloma: Had bone marrow biopsy this week and will start daratumumab.     Followup 6 months APP.   I spent 31 minutes reviewing records, interviewing/examining patient, and managing orders.   Signed, Ezra Shuck, MD  07/18/2024  [facility] [address] [facility] [place] [address] [phone] (office) [phone] (fax)

## 2024-07-18 NOTE — Patient Instructions (Signed)
 There has been no changes to your medications.  Your physician has requested that you have an echocardiogram. Echocardiography is a painless test that uses sound waves to create images of your heart. It provides your doctor with information about the size and shape of your heart and how well your heart's chambers and valves are working. This procedure takes approximately one hour. There are no restrictions for this procedure. Please do NOT wear cologne, perfume, aftershave, or lotions (deodorant is allowed). Please arrive 15 minutes prior to your appointment time.  Please note: We ask at that you not bring children with you during ultrasound (echo/ vascular) testing. Due to room size and safety concerns, children are not allowed in the ultrasound rooms during exams. Our front office staff cannot provide observation of children in our lobby area while testing is being conducted. An adult accompanying a patient to their appointment will only be allowed in the ultrasound room at the discretion of the ultrasound technician under special circumstances. We apologize for any inconvenience.  Your physician recommends that you schedule a follow-up appointment in: 6 months with an echocardiogram ( May 2026) ** PLEASE CALL THE OFFICE IN MARCH 2026 TO ARRANGE YOUR FOLLOW UP APPOINTMENT. **  If you have any questions or concerns before your next appointment please send us  a message through [facility] or call our office at [phone].    TO LEAVE A MESSAGE FOR THE NURSE SELECT OPTION 2, PLEASE LEAVE A MESSAGE INCLUDING: YOUR NAME DATE OF BIRTH CALL BACK NUMBER REASON FOR CALL**this is important as we prioritize the call backs  YOU WILL RECEIVE A CALL BACK THE SAME DAY AS LONG AS YOU CALL BEFORE 4:00 PM  At the [facility], you and your health needs are our priority. As part of our continuing mission to provide you with exceptional heart care, we have created designated Provider Care Teams.  These Care Teams include your primary Cardiologist (physician) and Advanced Practice Providers (APPs- Physician Assistants and Nurse Practitioners) who all work together to provide you with the care you need, when you need it.   You may see any of the following providers on your designated Care Team at your next follow up: Dr Toribio Fuel Dr Ezra Shuck Dr. Morene Brownie Greig Mosses, NP Caffie Shed, [place] [facility] [place], [place] Beckey Coe, NP Jordan Lee, NP Ellouise Class, NP Tinnie Redman, PharmD Jaun Bash, PharmD   Please be sure to bring in all your medications bottles to every appointment.    Thank you for choosing [facility]

## 2024-07-20 NOTE — Progress Notes (Signed)
 Pharmacist Chemotherapy Monitoring - Initial Assessment    Anticipated start date: 07/27/24   The following has been reviewed per standard work regarding the patient's treatment regimen: The patient's diagnosis, treatment plan and drug doses, and organ/hematologic function Lab orders and baseline tests specific to treatment regimen  The treatment plan start date, drug sequencing, and pre-medications Prior authorization status  Patient's documented medication list, including drug-drug interaction screen and prescriptions for anti-emetics and supportive care specific to the treatment regimen The drug concentrations, fluid compatibility, administration routes, and timing of the medications to be used The patient's access for treatment and lifetime cumulative dose history, if applicable  The patient's medication allergies and previous infusion related reactions, if applicable   Changes made to treatment plan:  treatment plan date  Follow up needed:  Type and screen + pretreatment RBC phenotype to be collected Ensure Revlimid  is removed from med list (switching from Revlimid  to Faspro)  [name] [name], RPH, 07/20/2024  3:58 PM

## 2024-07-25 ENCOUNTER — Encounter (HOSPITAL_COMMUNITY): Admitting: Cardiology

## 2024-07-27 ENCOUNTER — Other Ambulatory Visit: Payer: Self-pay

## 2024-07-27 ENCOUNTER — Inpatient Hospital Stay

## 2024-07-27 ENCOUNTER — Inpatient Hospital Stay: Attending: Hematology

## 2024-07-27 ENCOUNTER — Inpatient Hospital Stay (HOSPITAL_BASED_OUTPATIENT_CLINIC_OR_DEPARTMENT_OTHER): Admitting: Hematology

## 2024-07-27 ENCOUNTER — Other Ambulatory Visit (HOSPITAL_COMMUNITY): Payer: Self-pay | Admitting: Cardiology

## 2024-07-27 VITALS — BP 110/65 | HR 52 | Temp 97.6°F | Resp 17 | Ht 69.0 in | Wt 161.2 lb

## 2024-07-27 DIAGNOSIS — Z7189 Other specified counseling: Secondary | ICD-10-CM

## 2024-07-27 DIAGNOSIS — C9000 Multiple myeloma not having achieved remission: Secondary | ICD-10-CM

## 2024-07-27 DIAGNOSIS — C9002 Multiple myeloma in relapse: Secondary | ICD-10-CM | POA: Insufficient documentation

## 2024-07-27 DIAGNOSIS — Z5112 Encounter for antineoplastic immunotherapy: Secondary | ICD-10-CM | POA: Insufficient documentation

## 2024-07-27 DIAGNOSIS — C7951 Secondary malignant neoplasm of bone: Secondary | ICD-10-CM | POA: Insufficient documentation

## 2024-07-27 DIAGNOSIS — D696 Thrombocytopenia, unspecified: Secondary | ICD-10-CM | POA: Diagnosis not present

## 2024-07-27 DIAGNOSIS — Z5111 Encounter for antineoplastic chemotherapy: Secondary | ICD-10-CM | POA: Diagnosis not present

## 2024-07-27 DIAGNOSIS — G8929 Other chronic pain: Secondary | ICD-10-CM

## 2024-07-27 DIAGNOSIS — I480 Paroxysmal atrial fibrillation: Secondary | ICD-10-CM

## 2024-07-27 DIAGNOSIS — Z800 Family history of malignant neoplasm of digestive organs: Secondary | ICD-10-CM | POA: Insufficient documentation

## 2024-07-27 LAB — CMP (CANCER CENTER ONLY)
ALT: 19 U/L (ref 0–44)
AST: 18 U/L (ref 15–41)
Albumin: 4.1 g/dL (ref 3.5–5.0)
Alkaline Phosphatase: 79 U/L (ref 38–126)
Anion gap: 5 (ref 5–15)
BUN: 19 mg/dL (ref 8–23)
CO2: 29 mmol/L (ref 22–32)
Calcium: 9.3 mg/dL (ref 8.9–10.3)
Chloride: 105 mmol/L (ref 98–111)
Creatinine: 1.13 mg/dL (ref 0.61–1.24)
GFR, Estimated: >60 mL/min (ref >60)
Glucose, Bld: 112 mg/dL — ABNORMAL HIGH (ref 70–99)
Potassium: 4.3 mmol/L (ref 3.5–5.1)
Sodium: 139 mmol/L (ref 135–145)
Total Bilirubin: 0.6 mg/dL (ref 0.0–1.2)
Total Protein: 6.1 g/dL — ABNORMAL LOW (ref 6.5–8.1)

## 2024-07-27 LAB — CBC WITH DIFFERENTIAL (CANCER CENTER ONLY)
Abs Immature Granulocytes: 0.02 K/uL (ref 0.00–0.07)
Basophils Absolute: 0 K/uL (ref 0.0–0.1)
Basophils Relative: 2 %
Eosinophils Absolute: 0.1 K/uL (ref 0.0–0.5)
Eosinophils Relative: 2 %
HCT: 39 % (ref 39.0–52.0)
Hemoglobin: 12.9 g/dL — ABNORMAL LOW (ref 13.0–17.0)
Immature Granulocytes: 1 %
Lymphocytes Relative: 21 %
Lymphs Abs: 0.4 K/uL — ABNORMAL LOW (ref 0.7–4.0)
MCH: 31.5 pg (ref 26.0–34.0)
MCHC: 33.1 g/dL (ref 30.0–36.0)
MCV: 95.1 fL (ref 80.0–100.0)
Monocytes Absolute: 0.3 K/uL (ref 0.1–1.0)
Monocytes Relative: 14 %
Neutro Abs: 1.2 K/uL — ABNORMAL LOW (ref 1.7–7.7)
Neutrophils Relative %: 60 %
Platelet Count: 100 K/uL — ABNORMAL LOW (ref 150–400)
RBC: 4.1 MIL/uL — ABNORMAL LOW (ref 4.22–5.81)
RDW: 13.7 % (ref 11.5–15.5)
WBC Count: 2.1 K/uL — ABNORMAL LOW (ref 4.0–10.5)
nRBC: 0 % (ref 0.0–0.2)

## 2024-07-27 LAB — TYPE AND SCREEN
ABO/RH(D): AB NEG
Antibody Screen: NEGATIVE

## 2024-07-27 MED ORDER — ZOLEDRONIC ACID 4 MG/100ML IV SOLN
4.0000 mg | Freq: Once | INTRAVENOUS | Status: AC
  Administered 2024-07-27: 4 mg via INTRAVENOUS
  Filled 2024-07-27: qty 100

## 2024-07-27 MED ORDER — DIPHENHYDRAMINE HCL 25 MG PO CAPS
50.0000 mg | ORAL_CAPSULE | Freq: Once | ORAL | Status: AC
  Administered 2024-07-27: 50 mg via ORAL
  Filled 2024-07-27: qty 2

## 2024-07-27 MED ORDER — MONTELUKAST SODIUM 10 MG PO TABS
10.0000 mg | ORAL_TABLET | Freq: Once | ORAL | Status: AC
  Administered 2024-07-27: 10 mg via ORAL
  Filled 2024-07-27: qty 1

## 2024-07-27 MED ORDER — DARATUMUMAB-HYALURONIDASE-FIHJ 1800-30000 MG-UT/15ML ~~LOC~~ SOLN
1800.0000 mg | Freq: Once | SUBCUTANEOUS | Status: AC
  Administered 2024-07-27: 1800 mg via SUBCUTANEOUS
  Filled 2024-07-27: qty 15

## 2024-07-27 MED ORDER — ACETAMINOPHEN 325 MG PO TABS
650.0000 mg | ORAL_TABLET | Freq: Once | ORAL | Status: AC
  Administered 2024-07-27: 650 mg via ORAL
  Filled 2024-07-27: qty 2

## 2024-07-27 MED ORDER — DEXAMETHASONE 6 MG PO TABS
20.0000 mg | ORAL_TABLET | Freq: Once | ORAL | Status: AC
  Administered 2024-07-27: 20 mg via ORAL
  Filled 2024-07-27: qty 2

## 2024-07-27 NOTE — Patient Instructions (Signed)
 [facility] - A DEPT OF [facility]  Discharge Instructions: Thank you for choosing [facility] to provide your oncology and hematology care.   If you have a lab appointment with the Cancer Center, please go directly to the Cancer Center and check in at the registration area.   Wear comfortable clothing and clothing appropriate for easy access to any Portacath or PICC line.   We strive to give you quality time with your provider. You may need to reschedule your appointment if you arrive late (15 or more minutes).  Arriving late affects you and other patients whose appointments are after yours.  Also, if you miss three or more appointments without notifying the office, you may be dismissed from the clinic at the provider's discretion.      For prescription refill requests, have your pharmacy contact our office and allow 72 hours for refills to be completed.    Today you received the following chemotherapy and/or immunotherapy agents: daratumumab-hyaluronidase-fihj (DARZALEX FASPRO       To help prevent nausea and vomiting after your treatment, we encourage you to take your nausea medication as directed.  BELOW ARE SYMPTOMS THAT SHOULD BE REPORTED IMMEDIATELY: *FEVER GREATER THAN 100.4 F (38 C) OR HIGHER *CHILLS OR SWEATING *NAUSEA AND VOMITING THAT IS NOT CONTROLLED WITH YOUR NAUSEA MEDICATION *UNUSUAL SHORTNESS OF BREATH *UNUSUAL BRUISING OR BLEEDING *URINARY PROBLEMS (pain or burning when urinating, or frequent urination) *BOWEL PROBLEMS (unusual diarrhea, constipation, pain near the anus) TENDERNESS IN MOUTH AND THROAT WITH OR WITHOUT PRESENCE OF ULCERS (sore throat, sores in mouth, or a toothache) UNUSUAL RASH, SWELLING OR PAIN  UNUSUAL VAGINAL DISCHARGE OR ITCHING   Items with * indicate a potential emergency and should be followed up as soon as possible or go to the Emergency Department if any problems should occur.  Please show the  CHEMOTHERAPY ALERT CARD or IMMUNOTHERAPY ALERT CARD at check-in to the Emergency Department and triage nurse.  Should you have questions after your visit or need to cancel or reschedule your appointment, please contact [facility] - A DEPT OF [facility]  Dept: [phone]  and follow the prompts.  Office hours are 8:00 a.m. to 4:30 p.m. Monday - Friday. Please note that voicemails left after 4:00 p.m. may not be returned until the following business day.  We are closed weekends and major holidays. You have access to a nurse at all times for urgent questions. Please call the main number to the clinic Dept: [phone] and follow the prompts.   For any non-urgent questions, you may also contact your provider using MyChart. We now offer e-Visits for anyone 63 and older to request care online for non-urgent symptoms. For details visit [email or web address].   Also download the MyChart app! Go to the app store, search "MyChart", open the app, select [facility], and log in with your MyChart username and password.

## 2024-07-27 NOTE — Progress Notes (Signed)
 HEMATOLOGY ONCOLOGY PROGRESS NOTE  Date of service: 07/27/2024  Patient Care Team: [name], MD as PCP - General [name], MD as PCP - Advanced Heart Failure (Cardiology) [name], MD as PCP - Cardiology (Cardiology) [name], MD as Attending Physician (Neurosurgery) [name], MD as Consulting Physician (Dermatology) [name], MD as Consulting Physician (Pulmonary Disease) Patel, [name], DO as Consulting Physician (Neurology)  CHIEF COMPLAINT/PURPOSE OF CONSULTATION: Follow-up for continued evaluation and management of Multiple myeloma.  HISTORY OF PRESENTING ILLNESS: (05/16/2019)  [name] [name] is a wonderful 83 y.o. male who has been referred to us  by Lamarr Hummer, PA for evaluation and management of lytic bone lesions. The pt reports that he is doing well overall.   The pt reports that he has occasional hip pain that radiates down his legs and prevents him from walking. He uses Advil, which helps his hip pain. He is used to exercising often, but has not been able to stay very physically active lately so he is gaining weight. The pt experiences some SOB when he wakes up in the morning.   The pt had a pre-procedural CTA C/A/P before a TAVR completed on 03/14/2019 which revealed indeterminate osseus lesions in the bony pelvis, which led to an MRI and PET scan. He reports that he has pain when he pushes on his right chest.    He also notes that he had a fall while exercising in 06/2018 and thought he broke his back. He received a blood transfusion on 07/24/2018.    Of note prior to the patient's visit today, the pt has had a MRI pelvis w/wo contrast completed on 03/28/2019 with results revealing 1. Destructive bone lesions as detailed above. Findings most consistent with metastatic disease. PET-CT may be helpful for further evaluation and to establish a primary tumor. The right pelvic bone lesions should be amenable to image guided biopsy  but a PET scan may demonstrate easier/safer biopsy sites. 2. No intrapelvic mass or adenopathy. 3. Benign intraosseous lipoma involving the left anterior superior acetabulum.   The pt has also had PET whole body completed on 04/06/2019 with results revealing 1. Diffuse osseous metastatic disease as detailed above without findings for a primary neoplasm in the chest, abdomen or pelvis. The large destructive lesion involving the right ischium should be amenable to image guided biopsy. 2. Two small retroperitoneal lymph nodes and 1 small right obturator node showing hypermetabolism.    Most recent lab results (04/06/2019) of CBC is as follows: all values are WNL.   On review of systems, pt reports hip and leg pain, weight gain and denies syncope and denies belly pain, recent neuropathy and any other symptoms.    On PMHx the pt reports 5 cm hepatic flexure polyp removal, pneumonia, blood transfusion on 07/24/2018 On Social Hx the pt reports that he lives at home with his wife.   SUMMARY OF ONCOLOGIC HISTORY: Oncology History  Multiple myeloma not having achieved remission (HCC)  05/17/2019 Initial Diagnosis   Multiple myeloma not having achieved remission (HCC)   05/22/2019 - 07/17/2019 Chemotherapy   The patient had dexamethasone  (DECADRON ) tablet 20 mg, 20 mg, Oral,  Once, 2 of 2 cycles Administration: 20 mg (05/29/2019), 10 mg (06/12/2019) lenalidomide  (REVLIMID ) 15 MG capsule, 15 mg, Oral, Daily, 1 of 1 cycle, Start date: 07/05/2019, End date: 10/02/2019 bortezomib  SQ (VELCADE ) chemo injection 2.75 mg, 1.3 mg/m2 = 2.75 mg, Subcutaneous,  Once, 3 of 3 cycles Dose modification: 1 mg/m2 (original  dose 1.3 mg/m2, Cycle 2, Reason: Other (see comments)) Administration: 2.75 mg (06/12/2019), 2.75 mg (05/25/2019), 2.75 mg (05/29/2019), 2.75 mg (06/01/2019), 2.75 mg (05/22/2019), 2.75 mg (06/15/2019), 2.75 mg (06/19/2019), 2 mg (06/22/2019), 2.75 mg (07/03/2019), 2.75 mg (07/10/2019)  for chemotherapy treatment.     07/27/2024 -  Chemotherapy   Patient is on Treatment Plan : MYELOMA Daratumumab  SQ q28d     Malignant neoplasm metastatic to bone (HCC)  05/17/2019 Initial Diagnosis   Bone metastases (HCC)   05/22/2019 - 07/17/2019 Chemotherapy   The patient had dexamethasone  (DECADRON ) tablet 20 mg, 20 mg, Oral,  Once, 2 of 2 cycles Administration: 20 mg (05/29/2019), 10 mg (06/12/2019) lenalidomide  (REVLIMID ) 15 MG capsule, 15 mg, Oral, Daily, 1 of 1 cycle, Start date: 07/05/2019, End date: 10/02/2019 bortezomib  SQ (VELCADE ) chemo injection 2.75 mg, 1.3 mg/m2 = 2.75 mg, Subcutaneous,  Once, 3 of 3 cycles Dose modification: 1 mg/m2 (original dose 1.3 mg/m2, Cycle 2, Reason: Other (see comments)) Administration: 2.75 mg (06/12/2019), 2.75 mg (05/25/2019), 2.75 mg (05/29/2019), 2.75 mg (06/01/2019), 2.75 mg (05/22/2019), 2.75 mg (06/15/2019), 2.75 mg (06/19/2019), 2 mg (06/22/2019), 2.75 mg (07/03/2019), 2.75 mg (07/10/2019)  for chemotherapy treatment.         COPY AND PASTE NEW SURGICAL DOx INTERVAL HISTORY:  [name] [name] is a 83 y.o. male who is here today for continued evaluation and management of multiple myeloma. He is accompanied by his wife today.   he was last seen by me on 06/15/2024; at the time he mentioned experiencing joint/bone pain - specifies back pain and occasional rib pain-, myalgias, and neuropathy in his lower extremities that occasionally affects him. States that he does continue to use Oxycodone  as need for pain management, but notes that he has difficulty sleeping the night of.Says that he is otherwise doing well. Denies dental issues, abnormal constipation.   Today, he expressed bone pain in his shoulders, backs, ribs, and legs. His pain increases everyday in his ribs, back, and legs. The rib pain is waking him up at night.  His bone marrow biopsy went well.  He exercises on a stationary bike.    REVIEW OF SYSTEMS:   10 Point review of systems of done and is negative except as noted  above.  MEDICAL HISTORY Past Medical History:  Diagnosis Date   Ascending aortic aneurysm    Bicuspid aortic valve    Cancer (HCC)    CHF NYHA class I (no symptoms from ordinary activities), acute, diastolic (HCC)    Dysrhythmia 7990   A fib   Fatty liver    mild   Fracture    left proximal humerus   GERD (gastroesophageal reflux disease)    GI bleeding 07/21/2018   post polypectomy   Hemorrhoids    HTN (hypertension)    Hypercholesteremia    Hypokalemia    Internal hemorrhoids    LBP (low back pain)    Moderate aortic stenosis    Osteoarthritis    Paroxysmal atrial fibrillation (HCC)    a. new onset Afib in 07/2008. He underwent ibutilide cardioversion successfully. b. Recurrence 01/2013 s/p TEE/DCCV - was on Xarelto  but he stopped it as he was convinced it was causing joint pn. c. Recurrence 01/2016 - spont conv to NSR. Pt took Eliquis  x1 mo then declined further anticoag. d. Recurrence 07/2016.   Pneumonia    Tubular adenoma of colon     SURGICAL HISTORY Past Surgical History:  Procedure Laterality Date   BACK SURGERY  x12 years ago  CARDIAC CATHETERIZATION  2020   CARDIAC VALVE REPLACEMENT  2020   CARDIOVERSION N/A 01/26/2013   Procedure: CARDIOVERSION;  Surgeon: Ezra GORMAN Shuck, MD;  Location: [facility] ENDOSCOPY;  Service: Cardiovascular;  Laterality: N/A;   CARDIOVERSION N/A 10/28/2017   Procedure: CARDIOVERSION;  Surgeon: Shuck Ezra GORMAN, MD;  Location: [facility] ENDOSCOPY;  Service: Cardiovascular;  Laterality: N/A;   CARDIOVERSION N/A 03/03/2018   Procedure: CARDIOVERSION;  Surgeon: Pietro Redell GORMAN, MD;  Location: [facility] ENDOSCOPY;  Service: Cardiovascular;  Laterality: N/A;   CARDIOVERSION N/A 09/19/2019   Procedure: CARDIOVERSION;  Surgeon: Shuck Ezra GORMAN, MD;  Location: [facility] ENDOSCOPY;  Service: Cardiovascular;  Laterality: N/A;   COLONOSCOPY     COLONOSCOPY  07/17/2018   at [facility]   HEMORRHOID SURGERY     INGUINAL HERNIA REPAIR Left 03/18/2020   Procedure: OPEN LEFT INGUINAL  HERNIA REPAIR;  Surgeon: Ethyl Lenis, MD;  Location: [facility] OR;  Service: General;  Laterality: Left;   LUMBAR LAMINECTOMY     ORIF HUMERUS FRACTURE Left 01/15/2020   Procedure: OPEN REDUCTION INTERNAL FIXATION (ORIF) PROXIMAL HUMERUS FRACTURE;  Surgeon: Sharl Selinda Dover, MD;  Location: [facility] OR;  Service: Orthopedics;  Laterality: Left;   POLYPECTOMY     RIGHT HEART CATH N/A 08/20/2019   Procedure: RIGHT HEART CATH;  Surgeon: Shuck Ezra GORMAN, MD;  Location: [facility] INVASIVE CV LAB;  Service: Cardiovascular;  Laterality: N/A;   RIGHT/LEFT HEART CATH AND CORONARY ANGIOGRAPHY N/A 03/07/2019   Procedure: RIGHT/LEFT HEART CATH AND CORONARY ANGIOGRAPHY;  Surgeon: Verlin Lonni BIRCH, MD;  Location: [facility] INVASIVE CV LAB;  Service: Cardiovascular;  Laterality: N/A;   TAVAR  04/2019   TEE WITHOUT CARDIOVERSION N/A 01/26/2013   Procedure: TRANSESOPHAGEAL ECHOCARDIOGRAM (TEE);  Surgeon: Ezra GORMAN Shuck, MD;  Location: [facility] ENDOSCOPY;  Service: Cardiovascular;  Laterality: N/A;   TEE WITHOUT CARDIOVERSION N/A 10/28/2017   Procedure: TRANSESOPHAGEAL ECHOCARDIOGRAM (TEE);  Surgeon: Shuck Ezra GORMAN, MD;  Location: [facility] ENDOSCOPY;  Service: Cardiovascular;  Laterality: N/A;   TEE WITHOUT CARDIOVERSION N/A 05/08/2019   Procedure: TRANSESOPHAGEAL ECHOCARDIOGRAM (TEE);  Surgeon: Verlin Lonni BIRCH, MD;  Location: [facility] INVASIVE CV LAB;  Service: Open Heart Surgery;  Laterality: N/A;   TEE WITHOUT CARDIOVERSION N/A 09/19/2019   Procedure: TRANSESOPHAGEAL ECHOCARDIOGRAM (TEE);  Surgeon: Shuck Ezra GORMAN, MD;  Location: [facility] ENDOSCOPY;  Service: Cardiovascular;  Laterality: N/A;   TRANSCATHETER AORTIC VALVE REPLACEMENT, TRANSFEMORAL N/A 05/08/2019   Procedure: TRANSCATHETER AORTIC VALVE REPLACEMENT, TRANSFEMORAL;  Surgeon: Verlin Lonni BIRCH, MD;  Location: [facility] INVASIVE CV LAB;  Service: Open Heart Surgery;  Laterality: N/A;    SOCIAL HISTORY Social History   Tobacco Use   Smoking status: Never   Smokeless tobacco: Never   Vaping Use   Vaping status: Never Used  Substance Use Topics   Alcohol use: Not Currently   Drug use: No    Social History   Social History Narrative   Patient lives in [place] w/ his wife. 2025/   He is a native of [place]. 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   Right-handed   Caffeine: 2 cups coffee per day   Two story home    SOCIAL DRIVERS OF HEALTH SDOH Screenings   Food Insecurity: No Food Insecurity (06/14/2024)  Housing: Unknown (06/14/2024)  Transportation Needs: No Transportation Needs (06/14/2024)  Utilities: Not At Risk (06/14/2024)  Alcohol Screen: Low Risk  (06/14/2024)  Depression (PHQ2-9): Low Risk  (07/27/2024)  Financial Resource Strain: Low Risk  (06/14/2024)  Physical Activity:  Inactive (06/14/2024)  Social Connections: Moderately Integrated (06/14/2024)  Stress: No Stress Concern Present (06/14/2024)  Tobacco Use: Low Risk  (07/16/2024)  Health Literacy: Adequate Health Literacy (06/14/2024)     FAMILY HISTORY Family History  Problem Relation Age of Onset   Colon cancer Mother 66   Hypertension Other    Coronary artery disease Neg Hx    Colon polyps Neg Hx    Esophageal cancer Neg Hx    Rectal cancer Neg Hx    Stomach cancer Neg Hx      ALLERGIES: is allergic to xarelto  [rivaroxaban ], corticosteroids, ramipril , zolpidem, and benazepril .  MEDICATIONS  Current Outpatient Medications  Medication Sig Dispense Refill   acyclovir  (ZOVIRAX ) 400 MG tablet Take 1 tablet (400 mg total) by mouth 2 (two) times daily. (Patient not taking: Reported on 07/18/2024) 60 tablet 11   apixaban  (ELIQUIS ) 5 MG TABS tablet Take 1 tablet (5 mg total) by mouth 2 (two) times daily. 180 tablet 0   aspirin  81 MG chewable tablet CHEW 1 TABLET BY MOUTH DAILY. 90 tablet 1   Carboxymethylcellul-Glycerin (LUBRICATING EYE DROPS OP) Place 1 drop into both eyes daily as needed (dry eyes).     Cholecalciferol  (VITAMIN D ) 50 MCG (2000 UT) tablet  Take 2,000 Units by mouth daily.     dexamethasone  (DECADRON ) 4 MG tablet Take 1 tablet (4 mg total) by mouth daily. Take for 2 days starting the night of chemotherapy. 20 tablet 4   diclofenac  Sodium (VOLTAREN ) 1 % GEL Apply 1 application topically 2 (two) times daily as needed (pain.).     gabapentin  (NEURONTIN ) 300 MG capsule TAKE 1 CAPSULE BY MOUTH EVERYDAY AT BEDTIME 90 capsule 3   ipratropium (ATROVENT ) 0.06 % nasal spray Place 2 sprays into the nose 3 (three) times daily. 15 mL 2   lenalidomide  (REVLIMID ) 10 MG capsule TAKE 1 CAPSULE BY MOUTH 1 TIME A DAY FOR 21 DAYS ON THEN 7 DAYS OFF 21 capsule 0   lidocaine -prilocaine  (EMLA ) cream Apply to affected area once 30 g 3   Magnesium  Oxide -Mg Supplement 250 MG TABS Take 250 mg by mouth daily.     metroNIDAZOLE  (METROCREAM ) 0.75 % cream Apply 1 application  topically 2 (two) times daily as needed (rash).     ondansetron  (ZOFRAN ) 8 MG tablet Take 1 tablet (8 mg total) by mouth every 8 (eight) hours as needed for nausea or vomiting. 30 tablet 1   oxyCODONE  (OXY IR/ROXICODONE ) 5 MG immediate release tablet Take 1-2 tablets (5-10 mg total) by mouth 2 (two) times daily as needed for severe pain (pain score 7-10). 120 tablet 0   prochlorperazine  (COMPAZINE ) 10 MG tablet Take 1 tablet (10 mg total) by mouth every 6 (six) hours as needed for nausea or vomiting. 30 tablet 1   senna-docusate (SENOKOT-S) 8.6-50 MG tablet Take 2 tablets by mouth daily as needed for mild constipation or moderate constipation.     spironolactone  (ALDACTONE ) 25 MG tablet TAKE 1 TABLET BY MOUTH EVERY DAY 90 tablet 3   Vitamin D , Ergocalciferol , (DRISDOL ) 1.25 MG (50000 UNIT) CAPS capsule TAKE 1 CAPSULE BY MOUTH ONE TIME PER WEEK 12 capsule 3   No current facility-administered medications for this visit.   Facility-Administered Medications Ordered in Other Visits  Medication Dose Route Frequency Provider Last Rate Last Admin   acetaminophen  (TYLENOL ) tablet 650 mg  650 mg Oral  Once Tomio Kirk Kishore, MD       daratumumab -hyaluronidase -fihj (DARZALEX  FASPRO) 1800-30000 MG-UT/15ML chemo SQ injection 1,800  mg  1,800 mg Subcutaneous Once Onesimo Emaline Brink, MD       dexamethasone  (DECADRON ) tablet 20 mg  20 mg Oral Once Azoria Abbett Kishore, MD       diphenhydrAMINE  (BENADRYL ) capsule 50 mg  50 mg Oral Once Briante Loveall Kishore, MD       montelukast  (SINGULAIR ) tablet 10 mg  10 mg Oral Once Nashika Coker Kishore, MD       Zoledronic  Acid (ZOMETA ) IVPB 4 mg  4 mg Intravenous Once Sharlett Lienemann Kishore, MD        PHYSICAL EXAMINATION: ECOG PERFORMANCE STATUS: 1 - Symptomatic but completely ambulatory VITALS: VSS GENERAL: alert, in no acute distress and comfortable SKIN: no acute rashes, no significant lesions EYES: conjunctiva are pink and non-injected, sclera anicteric OROPHARYNX: MMM, no exudates, no oropharyngeal erythema or ulceration NECK: supple, no JVD LYMPH:  no palpable lymphadenopathy in the cervical, axillary or inguinal regions LUNGS: clear to auscultation b/l with normal respiratory effort HEART: regular rate & rhythm ABDOMEN:  normoactive bowel sounds , non tender, not distended, no hepatosplenomegaly Extremity: no pedal edema PSYCH: alert & oriented x 3 with fluent speech NEURO: no focal motor/sensory deficits  LABORATORY DATA:   I have reviewed the data as listed     Latest Ref Rng & Units 07/27/2024   12:09 PM 07/16/2024    7:58 AM 06/08/2024    9:44 AM  CBC EXTENDED  WBC 4.0 - 10.5 K/uL 2.1  1.8  3.5   RBC 4.22 - 5.81 MIL/uL 4.10  4.06  4.65   Hemoglobin 13.0 - 17.0 g/dL 87.0  87.2  85.5   HCT 39.0 - 52.0 % 39.0  39.2  43.8   Platelets 150 - 400 K/uL 100  84  85   NEUT# 1.7 - 7.7 K/uL 1.2  0.8  2.4   Lymph# 0.7 - 4.0 K/uL 0.4  0.5  0.6        Latest Ref Rng & Units 06/08/2024    9:44 AM 04/13/2024    9:38 AM 03/02/2024   12:23 PM  CMP  Glucose 70 - 99 mg/dL 99  96  897   BUN 8 - 23 mg/dL 18  18  15    Creatinine 0.61 - 1.24 mg/dL  8.77  8.90  8.76   Sodium 135 - 145 mmol/L 140  140  139   Potassium 3.5 - 5.1 mmol/L 4.1  3.8  4.3   Chloride 98 - 111 mmol/L 105  105  104   CO2 22 - 32 mmol/L 32  30  28   Calcium  8.9 - 10.3 mg/dL 9.1  8.8  9.2   Total Protein 6.5 - 8.1 g/dL 6.4  6.4    Total Bilirubin 0.0 - 1.2 mg/dL 0.8  0.9    Alkaline Phos 38 - 126 U/L 78  70    AST 15 - 41 U/L 24  23    ALT 0 - 44 U/L 28  27     Narrative & Impression  CLINICAL DATA:  Elevated serum protein. Evaluate for myeloma multiple myeloma. Bone marrow biopsy for evaluation of myeloma progression.   EXAM: CT-guided bone marrow biopsy   TECHNIQUE: CT pelvis   CONTRAST:  None   RADIOPHARMACEUTICALS:  None   COMPARISON:  None   FINDINGS: The patient was placed in prone position on the CT gantry. Radiopaque markers were placed on the patient's skin and initial imaging of the pelvis was performed. The patient's skin was then  prepped and draped in the usual sterile fashion. Moderate sedation was provided for by the nursing staff under my supervision utilizing intravenous Versed  and fentanyl . The nurse had no other duties other than monitoring the patient and providing sedation during the procedure. I was present for the entire procedure.   1% lidocaine  was used to infiltrate the skin at the access site prior to a stab incision. Local anesthesia was then used to infiltrate the region of soft tissue from the skin to the right iliac bone. The bone marrow needle was then advanced and imaging demonstrated the needle tip to be in the cortex of the right iliac bone. The bone was then penetrated and a sample was obtained. After the sample was evaluated, approximately 5 mL of heparinized bone marrow sample was obtained by aspiration. A core sample was then obtained. Multiple attempts at sampling was performed in order to get 2 1 cm segments. All needles were then removed from the patient. Sterile dressing was applied.    IMPRESSION: Satisfactory core needle biopsy and aspiration of the right iliac bone marrow under CT guidance.   Electronically Signed: By: Cordella Banner On: 07/16/2024 11:13              05/24/2019 Bone Marrow Biopsy     04/16/2019 Surgical Pathology:    Surgical Pathology  CASE: [id number]  PATIENT: [facility]  Bone Marrow Report      Clinical History: Multiple myeloma, remission status unspecified (HCC)  (BH)      DIAGNOSIS:   BONE MARROW, ASPIRATE, CLOT, CORE:  -Variably cellular bone marrow with trilineage hematopoiesis and 2%  plasma cells  -See comment   PERIPHERAL BLOOD:  -Slight thrombocytopenia   COMMENT:   The bone marrow is variably cellular with trilineage hematopoiesis  including abundant megakaryocytes in the more cellular areas.  In this  background, the plasma cells represent 2% of all cells in the aspirate  with interstitial cells and a few very minute clusters in the  clot/biopsy sections.  The latter in particular display kappa light  chain excess/restriction most suggestive of minimal residual plasma cell  neoplasm despite limited findings.  Correlation with cytogenetic and  FISH studies is recommended.   RADIOGRAPHIC STUDIES: I have personally reviewed the radiological images as listed and agreed with the findings in the report. CT BONE MARROW BIOPSY & ASPIRATION Addendum Date: 07/16/2024 ADDENDUM REPORT: 07/16/2024 11:16 ADDENDUM: 50 mcg of intravenous fentanyl  and 1.5 mg intravenous Versed  were administered for a total sedation time of 10 minutes. Electronically Signed   By: Cordella Banner   On: 07/16/2024 11:16   Result Date: 07/16/2024 CLINICAL DATA:  Elevated serum protein. Evaluate for myeloma multiple myeloma. Bone marrow biopsy for evaluation of myeloma progression. EXAM: CT-guided bone marrow biopsy TECHNIQUE: CT pelvis CONTRAST:  None RADIOPHARMACEUTICALS:  None COMPARISON:  None FINDINGS: The patient was placed  in prone position on the CT gantry. Radiopaque markers were placed on the patient's skin and initial imaging of the pelvis was performed. The patient's skin was then prepped and draped in the usual sterile fashion. Moderate sedation was provided for by the nursing staff under my supervision utilizing intravenous Versed  and fentanyl . The nurse had no other duties other than monitoring the patient and providing sedation during the procedure. I was present for the entire procedure. 1% lidocaine  was used to infiltrate the skin at the access site prior to a stab incision. Local anesthesia was then used to infiltrate the region of soft tissue from  the skin to the right iliac bone. The bone marrow needle was then advanced and imaging demonstrated the needle tip to be in the cortex of the right iliac bone. The bone was then penetrated and a sample was obtained. After the sample was evaluated, approximately 5 mL of heparinized bone marrow sample was obtained by aspiration. A core sample was then obtained. Multiple attempts at sampling was performed in order to get 2 1 cm segments. All needles were then removed from the patient. Sterile dressing was applied. IMPRESSION: Satisfactory core needle biopsy and aspiration of the right iliac bone marrow under CT guidance. Electronically Signed: By: Cordella Banner On: 07/16/2024 11:13   NM PET Image Restage (PS) Whole Body Result Date: 06/04/2024 EXAM: PET CT WHOLE BODY 05/31/2024 09:42:47 AM TECHNIQUE: RADIOPHARMACEUTICAL: 7.99 mCi F-18 FDG Uptake time 60 minutes. Glucose level 93 mg/dl. PET imaging was acquired from the skull vertex through the feet. Non-contrast enhanced computed tomography was obtained for attenuation correction and anatomic localization. COMPARISON: 10/15/2021 CLINICAL HISTORY: Hematologic malignancy, assess treatment response; evaluation for progrression of myeloma. Multiple myeloma not having achieved remission. FINDINGS: HEAD AND NECK: No cervical nodal  hypermetabolism. No cervical adenopathy. Bilateral carotid atherosclerosis. CHEST: No thoracic nodal or pulmonary parenchymal hypermetabolism. Status post TAVR. Upper normal ascending aortic caliber at 3.9 cm. Aortic and coronary artery calcification. Centrilobular emphysema. A 2 mm subpleural right upper lobe pulmonary nodule is unchanged and below PET resolution. ABDOMEN AND PELVIS: No abdominal or pelvic nodal or parenchymal hypermetabolism. Normal adrenal glands. Bilateral low density renal lesions are likely cysts and do not warrant specific imaging follow up. Abdominal aortic atherosclerosis. Large colonic stool burden. Mild prostatomegaly. BONES AND SOFT TISSUE: New multifocal hypermetabolic osseous foci. Example posterior right third rib at SUV 5.0 on 73/4. Within the anterior second left rib at 1.2 cm and SUV 12.3 on 87/4. The right supra-acetabular/iliac lesion is no longer tracer avid, measuring SUV 1.6 today. Relatively similar in size and CT morphology at maximally 5.9 cm. No abnormal activity within the extremities. Left proximal humerus fixation. Osteopenia. IMPRESSION: 1. Findings consistent with progression of multiple myeloma with new multifocal hypermetabolic osseous lesions. 2. No evidence of hypermetabolic extraosseous myeloma. Electronically signed by: Rockey Kilts MD 06/04/2024 10:44 AM EDT RP Workstation: [id number]    ASSESSMENT & PLAN:  83 y.o. male with  #1 Plasma cell myeloma-currently in relapse   03/28/2019 MRI pelvis w/wo contrast revealed 1. Destructive bone lesions as detailed above. Findings most consistent with metastatic disease. PET-CT may be helpful for further evaluation and to establish a primary tumor. The right pelvic bone lesions should be amenable to image guided biopsy but a PET scan may demonstrate easier/safer biopsy sites. 2. No intrapelvic mass or adenopathy. 3. Benign intraosseous lipoma involving the left anterior superior acetabulum.   04/06/2019 PET  whole body revealed 1. Diffuse osseous metastatic disease as detailed above without findings for a primary neoplasm in the chest, abdomen or pelvis. The large destructive lesion involving the right ischium should be amenable to image guided biopsy. 2. Two small retroperitoneal lymph nodes and 1 small right obturator node showing hypermetabolism.    04/16/2019 Posterior right pelvis bone biopsy revealed "PLASMA CELL NEOPLASM"   05/24/2019 Bone Marrow Biopsy revealed BONE MARROW: - CELLULAR MARROW WITH INVOLVEMENT BY PLASMA CELL NEOPLASM (20%) PERIPHERAL BLOOD: - MORPHOLOGICALLY UNREMARKABLE   05/24/2019 FISH Panel revealed ABNORMAL result with 11q+, 14q+ and +17   #2 Severe aortic stenosis with bicuspid aortic valve -10/26/2018 ECHO revealed AVA at  0.8 cm2 and LV EF of 60-65% -05/08/2019 pt had a Transfemoral Transcatheter Aortic Valve Replacement   PET CT scan from 2/2 which shows There is persistent low level radiotracer uptake associated with the large lucent lesion within the superior right acetabulum. Additionally, there is a new focal area of increased radiotracer uptake above the background low level activity within this lesion with SUV max of 3.92. On the previous exam there was relatively homogeneous low level uptake within this lesion within SUV max of 2.43. Imaging findings are concerning for residual metabolically active tumor. Patient has seen radiation oncology since his last visit and they have decided to proceed with involved site radiation therapy to the FDG avid lesion to reduce the risk of fracture.   -He had his bone marrow biopsy on 11/03/2021 which shows 2% abnormal plasma cells.   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 and also radiculopathy due to chronic back issues   PLAN: - Discussed lab results on 07/27/2024 in detail with patient: -Increase in plasma cells 5%-12%, this is not affecting his CBC too much -WBC are low from Revlimid , but will continue to  improve upon discontinuing Revlimid  --tracking light chains, in the 400s today, slight change in the bone marrow biopsy    -Daratumumab  once a week for two months, then every two weeks for another two months, maintenance is once a month  -keeping an eye out for allergic reactions to daratumumab , will take dethamexazone 4 pills with breakfast the morning after each treatment to prevent reactions -will prescribe oxycodone  for pain management  -maintaining Acyclovir  twice a day  -instructed to stay hydrated to improve kidney function  FOLLOW-UP in Per integrated scheduling MD visit in 2 weeks  The total time spent in the appointment was 30 minutes* .  All of the patient's questions were answered and the patient knows to call the clinic with any problems, questions, or concerns.  Emaline Saran MD MS AAHIVMS [facility] Hematology/Oncology Physician [facility]  *Total Encounter Time as defined by the Centers for Medicare and Medicaid Services includes, in addition to the face-to-face time of a patient visit (documented in the note above) non-face-to-face time: obtaining and reviewing outside history, ordering and reviewing medications, tests or procedures, care coordination (communications with other health care professionals or caregivers) and documentation in the medical record.  I, Alan Blowers, acting as a neurosurgeon for Emaline Saran, MD.,have documented all relevant documentation on the behalf of Emaline Saran, MD,as directed by  Emaline Saran, MD while in the presence of Emaline Saran, MD.  I have reviewed the above documentation for accuracy and completeness, and I agree with the above.  Tanise Russman, MD

## 2024-07-27 NOTE — Progress Notes (Signed)
 Pt ok for tx today with ANC: 1.2 per Dr Onesimo

## 2024-07-27 NOTE — Progress Notes (Signed)
 Pt observed for 1 hour 45 minutes post daratumumab-hyaluronidase-fihj (DARZALEX FASPRO) injection. Pt request to end 2 hour post-ob 15 early due to him feeling OK. Pt tolerated Tx well w/out incident. VSS at discharge.  Ambulatory to lobby w/ wife.

## 2024-07-31 LAB — PRETREATMENT RBC PHENOTYPE: DAT, IgG: NEGATIVE

## 2024-08-03 ENCOUNTER — Other Ambulatory Visit: Payer: Self-pay

## 2024-08-03 ENCOUNTER — Inpatient Hospital Stay

## 2024-08-03 ENCOUNTER — Encounter: Payer: Self-pay | Admitting: Hematology

## 2024-08-03 ENCOUNTER — Other Ambulatory Visit: Payer: Self-pay | Admitting: Hematology

## 2024-08-03 VITALS — BP 136/73 | HR 54 | Temp 98.0°F | Resp 16 | Ht 69.0 in | Wt 163.5 lb

## 2024-08-03 DIAGNOSIS — Z7189 Other specified counseling: Secondary | ICD-10-CM

## 2024-08-03 DIAGNOSIS — C9000 Multiple myeloma not having achieved remission: Secondary | ICD-10-CM

## 2024-08-03 DIAGNOSIS — D696 Thrombocytopenia, unspecified: Secondary | ICD-10-CM | POA: Diagnosis not present

## 2024-08-03 DIAGNOSIS — C7951 Secondary malignant neoplasm of bone: Secondary | ICD-10-CM | POA: Diagnosis not present

## 2024-08-03 DIAGNOSIS — Z5112 Encounter for antineoplastic immunotherapy: Secondary | ICD-10-CM | POA: Diagnosis not present

## 2024-08-03 DIAGNOSIS — C9002 Multiple myeloma in relapse: Secondary | ICD-10-CM | POA: Diagnosis not present

## 2024-08-03 DIAGNOSIS — Z800 Family history of malignant neoplasm of digestive organs: Secondary | ICD-10-CM | POA: Diagnosis not present

## 2024-08-03 LAB — CBC WITH DIFFERENTIAL (CANCER CENTER ONLY)
Abs Immature Granulocytes: 0.01 K/uL (ref 0.00–0.07)
Basophils Absolute: 0 K/uL (ref 0.0–0.1)
Basophils Relative: 1 %
Eosinophils Absolute: 0.1 K/uL (ref 0.0–0.5)
Eosinophils Relative: 3 %
HCT: 38.2 % — ABNORMAL LOW (ref 39.0–52.0)
Hemoglobin: 12.8 g/dL — ABNORMAL LOW (ref 13.0–17.0)
Immature Granulocytes: 0 %
Lymphocytes Relative: 19 %
Lymphs Abs: 0.6 K/uL — ABNORMAL LOW (ref 0.7–4.0)
MCH: 31.6 pg (ref 26.0–34.0)
MCHC: 33.5 g/dL (ref 30.0–36.0)
MCV: 94.3 fL (ref 80.0–100.0)
Monocytes Absolute: 0.6 K/uL (ref 0.1–1.0)
Monocytes Relative: 18 %
Neutro Abs: 2 K/uL (ref 1.7–7.7)
Neutrophils Relative %: 59 %
Platelet Count: 105 K/uL — ABNORMAL LOW (ref 150–400)
RBC: 4.05 MIL/uL — ABNORMAL LOW (ref 4.22–5.81)
RDW: 13.9 % (ref 11.5–15.5)
WBC Count: 3.3 K/uL — ABNORMAL LOW (ref 4.0–10.5)
nRBC: 0 % (ref 0.0–0.2)

## 2024-08-03 MED ORDER — DEXAMETHASONE 6 MG PO TABS
20.0000 mg | ORAL_TABLET | Freq: Once | ORAL | Status: AC
  Administered 2024-08-03: 20 mg via ORAL
  Filled 2024-08-03: qty 2

## 2024-08-03 MED ORDER — MONTELUKAST SODIUM 10 MG PO TABS
10.0000 mg | ORAL_TABLET | Freq: Once | ORAL | Status: AC
  Administered 2024-08-03: 10 mg via ORAL
  Filled 2024-08-03: qty 1

## 2024-08-03 MED ORDER — DARATUMUMAB-HYALURONIDASE-FIHJ 1800-30000 MG-UT/15ML ~~LOC~~ SOLN
1800.0000 mg | Freq: Once | SUBCUTANEOUS | Status: AC
  Administered 2024-08-03: 1800 mg via SUBCUTANEOUS
  Filled 2024-08-03: qty 15

## 2024-08-03 MED ORDER — DIPHENHYDRAMINE HCL 25 MG PO CAPS
50.0000 mg | ORAL_CAPSULE | Freq: Once | ORAL | Status: AC
  Administered 2024-08-03: 50 mg via ORAL
  Filled 2024-08-03: qty 2

## 2024-08-03 MED ORDER — OXYCODONE HCL 5 MG PO TABS: 5.0000 mg | ORAL_TABLET | Freq: Two times a day (BID) | ORAL | 0 refills | Status: DC | PRN

## 2024-08-03 MED ORDER — ACETAMINOPHEN 325 MG PO TABS
650.0000 mg | ORAL_TABLET | Freq: Once | ORAL | Status: AC
  Administered 2024-08-03: 650 mg via ORAL
  Filled 2024-08-03: qty 2

## 2024-08-03 NOTE — Progress Notes (Signed)
 Here for his second Darzalex  Faspro. Tolerated first injection well he says. Injection competed and he is completing a one hour post-injection wait period before discharge.

## 2024-08-03 NOTE — Patient Instructions (Signed)
 [facility] - A DEPT OF [facility]  Discharge Instructions: Thank you for choosing [facility] to provide your oncology and hematology care.   If you have a lab appointment with the Cancer Center, please go directly to the Cancer Center and check in at the registration area.   Wear comfortable clothing and clothing appropriate for easy access to any Portacath or PICC line.   We strive to give you quality time with your provider. You may need to reschedule your appointment if you arrive late (15 or more minutes).  Arriving late affects you and other patients whose appointments are after yours.  Also, if you miss three or more appointments without notifying the office, you may be dismissed from the clinic at the provider's discretion.      For prescription refill requests, have your pharmacy contact our office and allow 72 hours for refills to be completed.    Today you received the following chemotherapy and/or immunotherapy agents: daratumumab-hyaluronidase-fihj (DARZALEX FASPRO       To help prevent nausea and vomiting after your treatment, we encourage you to take your nausea medication as directed.  BELOW ARE SYMPTOMS THAT SHOULD BE REPORTED IMMEDIATELY: *FEVER GREATER THAN 100.4 F (38 C) OR HIGHER *CHILLS OR SWEATING *NAUSEA AND VOMITING THAT IS NOT CONTROLLED WITH YOUR NAUSEA MEDICATION *UNUSUAL SHORTNESS OF BREATH *UNUSUAL BRUISING OR BLEEDING *URINARY PROBLEMS (pain or burning when urinating, or frequent urination) *BOWEL PROBLEMS (unusual diarrhea, constipation, pain near the anus) TENDERNESS IN MOUTH AND THROAT WITH OR WITHOUT PRESENCE OF ULCERS (sore throat, sores in mouth, or a toothache) UNUSUAL RASH, SWELLING OR PAIN  UNUSUAL VAGINAL DISCHARGE OR ITCHING   Items with * indicate a potential emergency and should be followed up as soon as possible or go to the Emergency Department if any problems should occur.  Please show the  CHEMOTHERAPY ALERT CARD or IMMUNOTHERAPY ALERT CARD at check-in to the Emergency Department and triage nurse.  Should you have questions after your visit or need to cancel or reschedule your appointment, please contact [facility] - A DEPT OF [facility]  Dept: [phone]  and follow the prompts.  Office hours are 8:00 a.m. to 4:30 p.m. Monday - Friday. Please note that voicemails left after 4:00 p.m. may not be returned until the following business day.  We are closed weekends and major holidays. You have access to a nurse at all times for urgent questions. Please call the main number to the clinic Dept: [phone] and follow the prompts.   For any non-urgent questions, you may also contact your provider using MyChart. We now offer e-Visits for anyone 63 and older to request care online for non-urgent symptoms. For details visit [email or web address].   Also download the MyChart app! Go to the app store, search "MyChart", open the app, select [facility], and log in with your MyChart username and password.

## 2024-08-07 ENCOUNTER — Other Ambulatory Visit: Payer: Self-pay | Admitting: Hematology

## 2024-08-07 ENCOUNTER — Other Ambulatory Visit: Payer: Self-pay

## 2024-08-07 DIAGNOSIS — C9000 Multiple myeloma not having achieved remission: Secondary | ICD-10-CM

## 2024-08-08 ENCOUNTER — Encounter: Payer: Self-pay | Admitting: Hematology

## 2024-08-09 NOTE — Progress Notes (Signed)
 HEMATOLOGY/ONCOLOGY CLINIC NOTE  Date of Service: 08/10/24    Patient Care Team: [name] Karlynn GAILS, MD as PCP - General [name] [name] RAMAN, MD as PCP - Advanced Heart Failure (Cardiology) [name] [name] RAMAN, MD as PCP - Cardiology (Cardiology) [name] Pac, MD as Attending Physician (Neurosurgery) [name] Blamer, MD as Consulting Physician (Dermatology) [name] Harden GAILS, MD as Consulting Physician (Pulmonary Disease) [name], [name] K, DO as Consulting Physician (Neurology)   CHIEF COMPLAINTS/PURPOSE OF CONSULTATION:  Follow-up for continued evaluation and management of multiple myeloma   INTERVAL HISTORY: [name] [name] Is a 83 y.o. male is here for continued evaluation and management of multiple myeloma.He was last seen by Dr. Onesimo on 07/27/2024. In the interim, he started Dara/Dex therapy.  He is unaccompanied for this visit.  [name] reports his energy levels are overall stable.  He does have persistent fatigue but he continues to complete his daily routines on his own and even tries to exercise as tolerated.  His appetite is unchanged.  His weight is overall stable but does oscillate 3 or 4 pounds with each visit.  He denies nausea, vomiting or any bowel habit changes.  He denies easy bruising or overt signs of bleeding.  He has chronic neuropathic pain involving his lower legs.  He takes gabapentin  and oxycodone  as needed for pain management.  He denies fevers, chills, night sweats, shortness of breath, chest pain or cough.  He has no other complaints.  Rest of the 10 point ROS as below.  MEDICAL HISTORY:  Past Medical History:  Diagnosis Date   Ascending aortic aneurysm    Bicuspid aortic valve    Cancer (HCC)    CHF NYHA class I (no symptoms from ordinary activities), acute, diastolic (HCC)    Dysrhythmia 7990   A fib   Fatty liver    mild   Fracture    left proximal humerus   GERD (gastroesophageal reflux disease)    GI bleeding 07/21/2018   post polypectomy    Hemorrhoids    HTN (hypertension)    Hypercholesteremia    Hypokalemia    Internal hemorrhoids    LBP (low back pain)    Moderate aortic stenosis    Osteoarthritis    Paroxysmal atrial fibrillation (HCC)    a. new onset Afib in 07/2008. He underwent ibutilide cardioversion successfully. b. Recurrence 01/2013 s/p TEE/DCCV - was on Xarelto  but he stopped it as he was convinced it was causing joint pn. c. Recurrence 01/2016 - spont conv to NSR. Pt took Eliquis  x1 mo then declined further anticoag. d. Recurrence 07/2016.   Pneumonia    Tubular adenoma of colon     SURGICAL HISTORY: Past Surgical History:  Procedure Laterality Date   BACK SURGERY  x12 years ago   CARDIAC CATHETERIZATION  2020   CARDIAC VALVE REPLACEMENT  2020   CARDIOVERSION N/A 01/26/2013   Procedure: CARDIOVERSION;  Surgeon: [name] [name] Rolan, MD;  Location: [facility] ENDOSCOPY;  Service: Cardiovascular;  Laterality: N/A;   CARDIOVERSION N/A 10/28/2017   Procedure: CARDIOVERSION;  Surgeon: [name] [name] RAMAN, MD;  Location: [facility] ENDOSCOPY;  Service: Cardiovascular;  Laterality: N/A;   CARDIOVERSION N/A 03/03/2018   Procedure: CARDIOVERSION;  Surgeon: [name] Redell RAMAN, MD;  Location: [facility] ENDOSCOPY;  Service: Cardiovascular;  Laterality: N/A;   CARDIOVERSION N/A 09/19/2019   Procedure: CARDIOVERSION;  Surgeon: [name] [name] RAMAN, MD;  Location: [facility] ENDOSCOPY;  Service: Cardiovascular;  Laterality: N/A;   COLONOSCOPY     COLONOSCOPY  07/17/2018  at [facility]   HEMORRHOID SURGERY     INGUINAL HERNIA REPAIR Left 03/18/2020   Procedure: OPEN LEFT INGUINAL HERNIA REPAIR;  Surgeon: [name] Lenis, MD;  Location: [facility] OR;  Service: General;  Laterality: Left;   LUMBAR LAMINECTOMY     ORIF HUMERUS FRACTURE Left 01/15/2020   Procedure: OPEN REDUCTION INTERNAL FIXATION (ORIF) PROXIMAL HUMERUS FRACTURE;  Surgeon: [name] Selinda Dover, MD;  Location: [facility] OR;  Service: Orthopedics;  Laterality: Left;   POLYPECTOMY     RIGHT HEART CATH N/A 08/20/2019   Procedure:  RIGHT HEART CATH;  Surgeon: [name] [name] RAMAN, MD;  Location: [facility] INVASIVE CV LAB;  Service: Cardiovascular;  Laterality: N/A;   RIGHT/LEFT HEART CATH AND CORONARY ANGIOGRAPHY N/A 03/07/2019   Procedure: RIGHT/LEFT HEART CATH AND CORONARY ANGIOGRAPHY;  Surgeon: [name] Lonni BIRCH, MD;  Location: [facility] INVASIVE CV LAB;  Service: Cardiovascular;  Laterality: N/A;   TAVAR  04/2019   TEE WITHOUT CARDIOVERSION N/A 01/26/2013   Procedure: TRANSESOPHAGEAL ECHOCARDIOGRAM (TEE);  Surgeon: [name] [name] Rolan, MD;  Location: [facility] ENDOSCOPY;  Service: Cardiovascular;  Laterality: N/A;   TEE WITHOUT CARDIOVERSION N/A 10/28/2017   Procedure: TRANSESOPHAGEAL ECHOCARDIOGRAM (TEE);  Surgeon: [name] [name] RAMAN, MD;  Location: [facility] ENDOSCOPY;  Service: Cardiovascular;  Laterality: N/A;   TEE WITHOUT CARDIOVERSION N/A 05/08/2019   Procedure: TRANSESOPHAGEAL ECHOCARDIOGRAM (TEE);  Surgeon: [name] Lonni BIRCH, MD;  Location: [facility] INVASIVE CV LAB;  Service: Open Heart Surgery;  Laterality: N/A;   TEE WITHOUT CARDIOVERSION N/A 09/19/2019   Procedure: TRANSESOPHAGEAL ECHOCARDIOGRAM (TEE);  Surgeon: [name] [name] RAMAN, MD;  Location: [facility] ENDOSCOPY;  Service: Cardiovascular;  Laterality: N/A;   TRANSCATHETER AORTIC VALVE REPLACEMENT, TRANSFEMORAL N/A 05/08/2019   Procedure: TRANSCATHETER AORTIC VALVE REPLACEMENT, TRANSFEMORAL;  Surgeon: [name] Lonni BIRCH, MD;  Location: [facility] INVASIVE CV LAB;  Service: Open Heart Surgery;  Laterality: N/A;    SOCIAL HISTORY: Social History   Socioeconomic History   Marital status: Married    Spouse name: Not on file   Number of children: 0   Years of education: Not on file   Highest education level: Not on file  Occupational History   Occupation: Retired Garment/textile Technologist: [employer]  Tobacco Use   Smoking status: Never   Smokeless tobacco: Never  Vaping Use   Vaping status: Never Used  Substance and Sexual Activity   Alcohol use: Not Currently   Drug use: No   Sexual  activity: Yes  Other Topics Concern   Not on file  Social History Narrative   Patient lives in [place] w/ his wife. 2025/   He is a native of [place]. 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   Right-handed   Caffeine: 2 cups coffee per day   Two story home   Social Drivers of Health   Financial Resource Strain: Low Risk  (06/14/2024)   Overall Financial Resource Strain (CARDIA)    Difficulty of Paying Living Expenses: Not hard at all  Food Insecurity: No Food Insecurity (06/14/2024)   Hunger Vital Sign    Worried About Running Out of Food in the Last Year: Never true    Ran Out of Food in the Last Year: Never true  Transportation Needs: No Transportation Needs (06/14/2024)   PRAPARE - Administrator, Civil Service (Medical): No    Lack of Transportation (Non-Medical): No  Physical Activity: Inactive (06/14/2024)   Exercise Vital Sign    Days of Exercise per  Week: 0 days    Minutes of Exercise per Session: 0 min  Stress: No Stress Concern Present (06/14/2024)   [employer] of Occupational Health - Occupational Stress Questionnaire    Feeling of Stress: Not at all  Social Connections: Moderately Integrated (06/14/2024)   Social Connection and Isolation Panel    Frequency of Communication with Friends and Family: Once a week    Frequency of Social Gatherings with Friends and Family: Once a week    Attends Religious Services: More than 4 times per year    Active Member of [employer] or Organizations: Yes    Attends Engineer, Structural: More than 4 times per year    Marital Status: Married  Catering Manager Violence: Not At Risk (06/14/2024)   Humiliation, Afraid, Rape, and Kick questionnaire    Fear of Current or Ex-Partner: No    Emotionally Abused: No    Physically Abused: No    Sexually Abused: No    FAMILY HISTORY: Family History  Problem Relation Age of Onset   Colon cancer Mother 29   Hypertension Other     Coronary artery disease Neg Hx    Colon polyps Neg Hx    Esophageal cancer Neg Hx    Rectal cancer Neg Hx    Stomach cancer Neg Hx     ALLERGIES:  is allergic to xarelto  [rivaroxaban ], corticosteroids, ramipril , zolpidem, and benazepril .  MEDICATIONS:  Current Outpatient Medications  Medication Sig Dispense Refill   acyclovir  (ZOVIRAX ) 400 MG tablet Take 1 tablet (400 mg total) by mouth 2 (two) times daily. (Patient not taking: Reported on 07/18/2024) 60 tablet 11   aspirin  81 MG chewable tablet CHEW 1 TABLET BY MOUTH DAILY. 90 tablet 1   Carboxymethylcellul-Glycerin (LUBRICATING EYE DROPS OP) Place 1 drop into both eyes daily as needed (dry eyes).     Cholecalciferol  (VITAMIN D ) 50 MCG (2000 UT) tablet Take 2,000 Units by mouth daily.     dexamethasone  (DECADRON ) 4 MG tablet Take 1 tablet (4 mg total) by mouth daily. Take for 2 days starting the night of chemotherapy. 20 tablet 4   diclofenac  Sodium (VOLTAREN ) 1 % GEL Apply 1 application topically 2 (two) times daily as needed (pain.).     ELIQUIS  5 MG TABS tablet TAKE 1 TABLET BY MOUTH TWICE A DAY 180 tablet 0   gabapentin  (NEURONTIN ) 300 MG capsule TAKE 1 CAPSULE BY MOUTH EVERYDAY AT BEDTIME 90 capsule 3   ipratropium (ATROVENT ) 0.06 % nasal spray Place 2 sprays into the nose 3 (three) times daily. 15 mL 2   lenalidomide  (REVLIMID ) 10 MG capsule Take 1 capsule (10 mg total) by mouth daily. Take 1 capsule (10 mg total) by mouth daily for 21 days. Take 7 days off. Repeat cycle. 21 capsule 0   lidocaine -prilocaine  (EMLA ) cream Apply to affected area once 30 g 3   Magnesium  Oxide -Mg Supplement 250 MG TABS Take 250 mg by mouth daily.     metroNIDAZOLE  (METROCREAM ) 0.75 % cream Apply 1 application  topically 2 (two) times daily as needed (rash).     ondansetron  (ZOFRAN ) 8 MG tablet Take 1 tablet (8 mg total) by mouth every 8 (eight) hours as needed for nausea or vomiting. 30 tablet 1   oxyCODONE  (OXY IR/ROXICODONE ) 5 MG immediate release  tablet Take 1-2 tablets (5-10 mg total) by mouth 2 (two) times daily as needed for severe pain (pain score 7-10). 120 tablet 0   prochlorperazine  (COMPAZINE ) 10 MG tablet Take 1 tablet (  10 mg total) by mouth every 6 (six) hours as needed for nausea or vomiting. 30 tablet 1   senna-docusate (SENOKOT-S) 8.6-50 MG tablet Take 2 tablets by mouth daily as needed for mild constipation or moderate constipation.     spironolactone  (ALDACTONE ) 25 MG tablet TAKE 1 TABLET BY MOUTH EVERY DAY 90 tablet 3   Vitamin D , Ergocalciferol , (DRISDOL ) 1.25 MG (50000 UNIT) CAPS capsule TAKE 1 CAPSULE BY MOUTH ONE TIME PER WEEK 12 capsule 3   No current facility-administered medications for this visit.    REVIEW OF SYSTEMS:   10 Point review of Systems was done is negative except as noted above.  PHYSICAL EXAMINATION: Vitals:   08/10/24 0908  BP: (!) 148/76  Pulse: (!) 56  Resp: 17  Temp: 97.6 F (36.4 C)  SpO2: 100%      Wt Readings from Last 3 Encounters:  08/10/24 162 lb (73.5 kg)  08/03/24 163 lb 8 oz (74.2 kg)  07/27/24 161 lb 4 oz (73.1 kg)   Body mass index is 23.92 kg/m.    ECOG FS:1 - Symptomatic but completely ambulatory . GENERAL:alert, in no acute distress and comfortable SKIN: no acute rashes, no significant lesions EYES: conjunctiva are pink and non-injected, sclera anicteric LUNGS: clear to auscultation b/l with normal respiratory effort HEART: regular rate & rhythm Extremity: no pedal edema PSYCH: alert & oriented x 3 with fluent speech NEURO: no focal motor/sensory deficits  LABORATORY DATA:  I have reviewed the data as listed  .    Latest Ref Rng & Units 08/10/2024    8:49 AM 08/03/2024   11:53 AM 07/27/2024   12:09 PM  CBC  WBC 4.0 - 10.5 K/uL 4.1  3.3  2.1   Hemoglobin 13.0 - 17.0 g/dL 86.3  87.1  87.0   Hematocrit 39.0 - 52.0 % 40.7  38.2  39.0   Platelets 150 - 400 K/uL 94  105  100     .    Latest Ref Rng & Units 08/10/2024    8:49 AM 07/27/2024    2:19  PM 06/08/2024    9:44 AM  CMP  Glucose 70 - 99 mg/dL 898  887  99   BUN 8 - 23 mg/dL 29  19  18    Creatinine 0.61 - 1.24 mg/dL 8.81  8.86  8.77   Sodium 135 - 145 mmol/L 138  139  140   Potassium 3.5 - 5.1 mmol/L 4.4  4.3  4.1   Chloride 98 - 111 mmol/L 104  105  105   CO2 22 - 32 mmol/L 25  29  32   Calcium  8.9 - 10.3 mg/dL 8.8  9.3  9.1   Total Protein 6.5 - 8.1 g/dL 5.9  6.1  6.4   Total Bilirubin 0.0 - 1.2 mg/dL 0.6  0.6  0.8   Alkaline Phos 38 - 126 U/L 114  79  78   AST 15 - 41 U/L 27  18  24    ALT 0 - 44 U/L 47  19  28          05/24/2019 Bone Marrow Biopsy    04/16/2019 Surgical Pathology:   Surgical Pathology  CASE: [id number]  PATIENT: [facility]  Bone Marrow Report      Clinical History: Multiple myeloma, remission status unspecified (HCC)  (BH)      DIAGNOSIS:   BONE MARROW, ASPIRATE, CLOT, CORE:  -Variably cellular bone marrow with trilineage hematopoiesis and 2%  plasma cells  -  See comment   PERIPHERAL BLOOD:  -Slight thrombocytopenia   COMMENT:   The bone marrow is variably cellular with trilineage hematopoiesis  including abundant megakaryocytes in the more cellular areas.  In this  background, the plasma cells represent 2% of all cells in the aspirate  with interstitial cells and a few very minute clusters in the  clot/biopsy sections.  The latter in particular display kappa light  chain excess/restriction most suggestive of minimal residual plasma cell  neoplasm despite limited findings.  Correlation with cytogenetic and  FISH studies is recommended.    RADIOGRAPHIC STUDIES: I have personally reviewed the radiological images as listed and agreed with the findings in the report. CT BONE MARROW BIOPSY & ASPIRATION Addendum Date: 07/16/2024 ADDENDUM REPORT: 07/16/2024 11:16 ADDENDUM: 50 mcg of intravenous fentanyl  and 1.5 mg intravenous Versed  were administered for a total sedation time of 10 minutes. Electronically Signed   By:  Cordella Banner   On: 07/16/2024 11:16   Result Date: 07/16/2024 CLINICAL DATA:  Elevated serum protein. Evaluate for myeloma multiple myeloma. Bone marrow biopsy for evaluation of myeloma progression. EXAM: CT-guided bone marrow biopsy TECHNIQUE: CT pelvis CONTRAST:  None RADIOPHARMACEUTICALS:  None COMPARISON:  None FINDINGS: The patient was placed in prone position on the CT gantry. Radiopaque markers were placed on the patient's skin and initial imaging of the pelvis was performed. The patient's skin was then prepped and draped in the usual sterile fashion. Moderate sedation was provided for by the nursing staff under my supervision utilizing intravenous Versed  and fentanyl . The nurse had no other duties other than monitoring the patient and providing sedation during the procedure. I was present for the entire procedure. 1% lidocaine  was used to infiltrate the skin at the access site prior to a stab incision. Local anesthesia was then used to infiltrate the region of soft tissue from the skin to the right iliac bone. The bone marrow needle was then advanced and imaging demonstrated the needle tip to be in the cortex of the right iliac bone. The bone was then penetrated and a sample was obtained. After the sample was evaluated, approximately 5 mL of heparinized bone marrow sample was obtained by aspiration. A core sample was then obtained. Multiple attempts at sampling was performed in order to get 2 1 cm segments. All needles were then removed from the patient. Sterile dressing was applied. IMPRESSION: Satisfactory core needle biopsy and aspiration of the right iliac bone marrow under CT guidance. Electronically Signed: By: Cordella Banner On: 07/16/2024 11:13    ASSESSMENT & PLAN:  Tenzin Edelman is a 83 y.o. male who presents for continued management of multiple myeloma.   #Multiple myeloma: --Initially treated with Velcade /Revlimid /Dexamethasone , started on 05/22/2019. Added Zometa  on 07/03/2019.   Discontinued Velcade  and Revlimid  after 07/10/2019 due to grade 2-3 neuropathy. He continued on Zometa  infusions --PET scan from 10/15/2021 showed persistent low level radiotracer uptake associated with the large lucent lesion within the superior right acetabulum. Additionally, there is a new focal area of increased radiotracer uptake above the background low level activity within this lesion with SUV max of 3.92. On the previous exam there was relatively homogeneous low level uptake within this lesion within SUV max of 2.43. Imaging findings are concerning for residual metabolically active tumor. --Bone marrow biopsy on 11/03/21 showed 2 % plasma cells.  --Received palliative radiation to right acetabulum from 11/30/2021-12/11/2021. Received 25 Gy in 10 Fx.  --Resumed dose reduced Revlmid 5 mg PO daily 21 days on/7 days  off every 28 days on 01/03/2022 --PET/CT scan from 05/31/2024 showed progression of multiple myeloma with new multifocal hypermetabolic osseous lesions. Kappa light chain had increased to 477.8 with ratio of 45.08 on 06/08/2024.  --Switched to Dara/Dex on 07/27/2024.   #2 Severe aortic stenosis with bicuspid aortic valve -10/26/2018 ECHO revealed AVA at 0.8 cm2 and LV EF of 60-65% -05/08/2019 pt had a Transfemoral Transcatheter Aortic Valve Replacement  #3 B/L lower extremity neuropathy -Secondary from previous myeloma treatments (velcade ) and also radiculopathy due to chronic back issues -Currently takes oxycodone  and gabapentin    PLAN: --Due for cycle 1, day 15 of dara/dex today --Labs from today show WBC 4.1, Hgb 13.6, Plt 94K, creatinine normal, AST 27, ALT mildly elevated at 47. --Proceed with treatment today without any dose modifications. --Last Zometa  was on 07/27/2024. Next one due Feb 2025.  --Continue Acyclovir  twice a day  --Continue Dexamethasone  4 mg PO daily for 2 days after treatment  FOLLOW UP: Continue with treatment per integrative scheduling. Toxicity checks  every 2 weeks  All of the patient's questions were answered with apparent satisfaction. The patient knows to call the clinic with any problems, questions or concerns.  I have spent a total of 30 minutes minutes of face-to-face and non-face-to-face time, preparing to see the patient, performing a medically appropriate examination, counseling and educating the patient,documenting clinical information in the electronic health record, independently interpreting results and communicating results to the patient, and care coordination.   Johnston Police PA-C Dept of Hematology and Oncology [facility] at [facility] Phone: [phone]

## 2024-08-10 ENCOUNTER — Inpatient Hospital Stay

## 2024-08-10 ENCOUNTER — Inpatient Hospital Stay (HOSPITAL_BASED_OUTPATIENT_CLINIC_OR_DEPARTMENT_OTHER): Admitting: Physician Assistant

## 2024-08-10 VITALS — BP 148/76 | HR 56 | Temp 97.6°F | Resp 17 | Ht 69.0 in | Wt 162.0 lb

## 2024-08-10 DIAGNOSIS — C9000 Multiple myeloma not having achieved remission: Secondary | ICD-10-CM

## 2024-08-10 DIAGNOSIS — Z800 Family history of malignant neoplasm of digestive organs: Secondary | ICD-10-CM | POA: Diagnosis not present

## 2024-08-10 DIAGNOSIS — C9002 Multiple myeloma in relapse: Secondary | ICD-10-CM | POA: Diagnosis not present

## 2024-08-10 DIAGNOSIS — Z7189 Other specified counseling: Secondary | ICD-10-CM

## 2024-08-10 DIAGNOSIS — Z5112 Encounter for antineoplastic immunotherapy: Secondary | ICD-10-CM | POA: Diagnosis not present

## 2024-08-10 DIAGNOSIS — D696 Thrombocytopenia, unspecified: Secondary | ICD-10-CM | POA: Diagnosis not present

## 2024-08-10 DIAGNOSIS — C7951 Secondary malignant neoplasm of bone: Secondary | ICD-10-CM | POA: Diagnosis not present

## 2024-08-10 LAB — CBC WITH DIFFERENTIAL (CANCER CENTER ONLY)
Abs Immature Granulocytes: 0.01 K/uL (ref 0.00–0.07)
Basophils Absolute: 0 K/uL (ref 0.0–0.1)
Basophils Relative: 1 %
Eosinophils Absolute: 0.1 K/uL (ref 0.0–0.5)
Eosinophils Relative: 3 %
HCT: 40.7 % (ref 39.0–52.0)
Hemoglobin: 13.6 g/dL (ref 13.0–17.0)
Immature Granulocytes: 0 %
Lymphocytes Relative: 13 %
Lymphs Abs: 0.5 K/uL — ABNORMAL LOW (ref 0.7–4.0)
MCH: 31.7 pg (ref 26.0–34.0)
MCHC: 33.4 g/dL (ref 30.0–36.0)
MCV: 94.9 fL (ref 80.0–100.0)
Monocytes Absolute: 0.4 K/uL (ref 0.1–1.0)
Monocytes Relative: 11 %
Neutro Abs: 3 K/uL (ref 1.7–7.7)
Neutrophils Relative %: 72 %
Platelet Count: 94 K/uL — ABNORMAL LOW (ref 150–400)
RBC: 4.29 MIL/uL (ref 4.22–5.81)
RDW: 14.6 % (ref 11.5–15.5)
WBC Count: 4.1 K/uL (ref 4.0–10.5)
nRBC: 0 % (ref 0.0–0.2)

## 2024-08-10 LAB — CMP (CANCER CENTER ONLY)
ALT: 47 U/L — ABNORMAL HIGH (ref 0–44)
AST: 27 U/L (ref 15–41)
Albumin: 4 g/dL (ref 3.5–5.0)
Alkaline Phosphatase: 114 U/L (ref 38–126)
Anion gap: 9 (ref 5–15)
BUN: 29 mg/dL — ABNORMAL HIGH (ref 8–23)
CO2: 25 mmol/L (ref 22–32)
Calcium: 8.8 mg/dL — ABNORMAL LOW (ref 8.9–10.3)
Chloride: 104 mmol/L (ref 98–111)
Creatinine: 1.18 mg/dL (ref 0.61–1.24)
GFR, Estimated: >60 mL/min (ref >60)
Glucose, Bld: 101 mg/dL — ABNORMAL HIGH (ref 70–99)
Potassium: 4.4 mmol/L (ref 3.5–5.1)
Sodium: 138 mmol/L (ref 135–145)
Total Bilirubin: 0.6 mg/dL (ref 0.0–1.2)
Total Protein: 5.9 g/dL — ABNORMAL LOW (ref 6.5–8.1)

## 2024-08-10 MED ORDER — DEXAMETHASONE 6 MG PO TABS
20.0000 mg | ORAL_TABLET | Freq: Once | ORAL | Status: AC
  Administered 2024-08-10: 20 mg via ORAL
  Filled 2024-08-10: qty 2

## 2024-08-10 MED ORDER — DIPHENHYDRAMINE HCL 25 MG PO CAPS
50.0000 mg | ORAL_CAPSULE | Freq: Once | ORAL | Status: AC
  Administered 2024-08-10: 50 mg via ORAL
  Filled 2024-08-10: qty 2

## 2024-08-10 MED ORDER — DARATUMUMAB-HYALURONIDASE-FIHJ 1800-30000 MG-UT/15ML ~~LOC~~ SOLN
1800.0000 mg | Freq: Once | SUBCUTANEOUS | Status: AC
  Administered 2024-08-10: 1800 mg via SUBCUTANEOUS
  Filled 2024-08-10: qty 15

## 2024-08-10 MED ORDER — MONTELUKAST SODIUM 10 MG PO TABS
10.0000 mg | ORAL_TABLET | Freq: Once | ORAL | Status: AC
  Administered 2024-08-10: 10 mg via ORAL
  Filled 2024-08-10: qty 1

## 2024-08-10 MED ORDER — ACETAMINOPHEN 325 MG PO TABS
650.0000 mg | ORAL_TABLET | Freq: Once | ORAL | Status: AC
  Administered 2024-08-10: 650 mg via ORAL
  Filled 2024-08-10: qty 2

## 2024-08-10 NOTE — Patient Instructions (Signed)
 [facility] - A DEPT OF [facility]  Discharge Instructions: Thank you for choosing [facility] to provide your oncology and hematology care.   If you have a lab appointment with the Cancer Center, please go directly to the Cancer Center and check in at the registration area.   Wear comfortable clothing and clothing appropriate for easy access to any Portacath or PICC line.   We strive to give you quality time with your provider. You may need to reschedule your appointment if you arrive late (15 or more minutes).  Arriving late affects you and other patients whose appointments are after yours.  Also, if you miss three or more appointments without notifying the office, you may be dismissed from the clinic at the provider's discretion.      For prescription refill requests, have your pharmacy contact our office and allow 72 hours for refills to be completed.    Today you received the following chemotherapy and/or immunotherapy agents: daratumumab-hyaluronidase-fihj (DARZALEX FASPRO       To help prevent nausea and vomiting after your treatment, we encourage you to take your nausea medication as directed.  BELOW ARE SYMPTOMS THAT SHOULD BE REPORTED IMMEDIATELY: *FEVER GREATER THAN 100.4 F (38 C) OR HIGHER *CHILLS OR SWEATING *NAUSEA AND VOMITING THAT IS NOT CONTROLLED WITH YOUR NAUSEA MEDICATION *UNUSUAL SHORTNESS OF BREATH *UNUSUAL BRUISING OR BLEEDING *URINARY PROBLEMS (pain or burning when urinating, or frequent urination) *BOWEL PROBLEMS (unusual diarrhea, constipation, pain near the anus) TENDERNESS IN MOUTH AND THROAT WITH OR WITHOUT PRESENCE OF ULCERS (sore throat, sores in mouth, or a toothache) UNUSUAL RASH, SWELLING OR PAIN  UNUSUAL VAGINAL DISCHARGE OR ITCHING   Items with * indicate a potential emergency and should be followed up as soon as possible or go to the Emergency Department if any problems should occur.  Please show the  CHEMOTHERAPY ALERT CARD or IMMUNOTHERAPY ALERT CARD at check-in to the Emergency Department and triage nurse.  Should you have questions after your visit or need to cancel or reschedule your appointment, please contact [facility] - A DEPT OF [facility]  Dept: [phone]  and follow the prompts.  Office hours are 8:00 a.m. to 4:30 p.m. Monday - Friday. Please note that voicemails left after 4:00 p.m. may not be returned until the following business day.  We are closed weekends and major holidays. You have access to a nurse at all times for urgent questions. Please call the main number to the clinic Dept: [phone] and follow the prompts.   For any non-urgent questions, you may also contact your provider using MyChart. We now offer e-Visits for anyone 63 and older to request care online for non-urgent symptoms. For details visit [email or web address].   Also download the MyChart app! Go to the app store, search "MyChart", open the app, select [facility], and log in with your MyChart username and password.

## 2024-08-11 ENCOUNTER — Other Ambulatory Visit: Payer: Self-pay

## 2024-08-12 LAB — MULTIPLE MYELOMA PANEL, SERUM
Albumin SerPl Elph-Mcnc: 3.5 g/dL (ref 2.9–4.4)
Albumin/Glob SerPl: 1.9 — ABNORMAL HIGH (ref 0.7–1.7)
Alpha 1: 0.2 g/dL (ref 0.0–0.4)
Alpha2 Glob SerPl Elph-Mcnc: 0.5 g/dL (ref 0.4–1.0)
B-Globulin SerPl Elph-Mcnc: 0.7 g/dL (ref 0.7–1.3)
Gamma Glob SerPl Elph-Mcnc: 0.5 g/dL (ref 0.4–1.8)
Globulin, Total: 1.9 g/dL — ABNORMAL LOW (ref 2.2–3.9)
IgA: 39 mg/dL — ABNORMAL LOW (ref 61–437)
IgG (Immunoglobin G), Serum: 540 mg/dL — ABNORMAL LOW (ref 603–1613)
IgM (Immunoglobulin M), Srm: 9 mg/dL — ABNORMAL LOW (ref 15–143)
M Protein SerPl Elph-Mcnc: 0.2 g/dL — ABNORMAL HIGH
Total Protein ELP: 5.4 g/dL — ABNORMAL LOW (ref 6.0–8.5)

## 2024-08-13 LAB — KAPPA/LAMBDA LIGHT CHAINS
Kappa free light chain: 56.5 mg/L — ABNORMAL HIGH (ref 3.3–19.4)
Kappa, lambda light chain ratio: 17.66 — ABNORMAL HIGH (ref 0.26–1.65)
Lambda free light chains: 3.2 mg/L — ABNORMAL LOW (ref 5.7–26.3)

## 2024-08-13 NOTE — Addendum Note (Signed)
 Encounter addended by: Zulema Emmalene PARAS, RN on: 08/13/2024 3:29 PM  Actions taken: Imaging Exam ended

## 2024-08-17 ENCOUNTER — Encounter: Payer: Self-pay | Admitting: Hematology

## 2024-08-17 ENCOUNTER — Inpatient Hospital Stay: Admitting: Hematology

## 2024-08-17 ENCOUNTER — Inpatient Hospital Stay

## 2024-08-17 ENCOUNTER — Inpatient Hospital Stay: Attending: Hematology

## 2024-08-17 VITALS — BP 121/64 | HR 59 | Temp 97.8°F | Resp 16 | Wt 160.0 lb

## 2024-08-17 DIAGNOSIS — Z7189 Other specified counseling: Secondary | ICD-10-CM

## 2024-08-17 DIAGNOSIS — Z79899 Other long term (current) drug therapy: Secondary | ICD-10-CM | POA: Diagnosis not present

## 2024-08-17 DIAGNOSIS — Q2381 Bicuspid aortic valve: Secondary | ICD-10-CM | POA: Diagnosis not present

## 2024-08-17 DIAGNOSIS — G5793 Unspecified mononeuropathy of bilateral lower limbs: Secondary | ICD-10-CM | POA: Insufficient documentation

## 2024-08-17 DIAGNOSIS — C9000 Multiple myeloma not having achieved remission: Secondary | ICD-10-CM | POA: Insufficient documentation

## 2024-08-17 DIAGNOSIS — I350 Nonrheumatic aortic (valve) stenosis: Secondary | ICD-10-CM | POA: Diagnosis not present

## 2024-08-17 DIAGNOSIS — Z5112 Encounter for antineoplastic immunotherapy: Secondary | ICD-10-CM | POA: Insufficient documentation

## 2024-08-17 LAB — CBC WITH DIFFERENTIAL (CANCER CENTER ONLY)
Abs Immature Granulocytes: 0.02 K/uL (ref 0.00–0.07)
Basophils Absolute: 0 K/uL (ref 0.0–0.1)
Basophils Relative: 0 %
Eosinophils Absolute: 0.2 K/uL (ref 0.0–0.5)
Eosinophils Relative: 4 %
HCT: 40.8 % (ref 39.0–52.0)
Hemoglobin: 13.7 g/dL (ref 13.0–17.0)
Immature Granulocytes: 1 %
Lymphocytes Relative: 17 %
Lymphs Abs: 0.6 K/uL — ABNORMAL LOW (ref 0.7–4.0)
MCH: 31.8 pg (ref 26.0–34.0)
MCHC: 33.6 g/dL (ref 30.0–36.0)
MCV: 94.7 fL (ref 80.0–100.0)
Monocytes Absolute: 0.5 K/uL (ref 0.1–1.0)
Monocytes Relative: 12 %
Neutro Abs: 2.5 K/uL (ref 1.7–7.7)
Neutrophils Relative %: 66 %
Platelet Count: 97 K/uL — ABNORMAL LOW (ref 150–400)
RBC: 4.31 MIL/uL (ref 4.22–5.81)
RDW: 14.5 % (ref 11.5–15.5)
WBC Count: 3.9 K/uL — ABNORMAL LOW (ref 4.0–10.5)
nRBC: 0 % (ref 0.0–0.2)

## 2024-08-17 MED ORDER — DIPHENHYDRAMINE HCL 25 MG PO CAPS
50.0000 mg | ORAL_CAPSULE | Freq: Once | ORAL | Status: AC
  Administered 2024-08-17: 50 mg via ORAL
  Filled 2024-08-17: qty 2

## 2024-08-17 MED ORDER — ACETAMINOPHEN 325 MG PO TABS
650.0000 mg | ORAL_TABLET | Freq: Once | ORAL | Status: AC
  Administered 2024-08-17: 650 mg via ORAL
  Filled 2024-08-17: qty 2

## 2024-08-17 MED ORDER — DEXAMETHASONE 6 MG PO TABS
20.0000 mg | ORAL_TABLET | Freq: Once | ORAL | Status: AC
  Administered 2024-08-17: 20 mg via ORAL
  Filled 2024-08-17: qty 2

## 2024-08-17 MED ORDER — DARATUMUMAB-HYALURONIDASE-FIHJ 1800-30000 MG-UT/15ML ~~LOC~~ SOLN
1800.0000 mg | Freq: Once | SUBCUTANEOUS | Status: AC
  Administered 2024-08-17: 1800 mg via SUBCUTANEOUS
  Filled 2024-08-17: qty 15

## 2024-08-17 NOTE — Progress Notes (Signed)
 Pt ok for tx today with Plts: 97 per Dr Onesimo

## 2024-08-17 NOTE — Patient Instructions (Signed)
 [facility] - A DEPT OF [facility]  Discharge Instructions: Thank you for choosing [facility] to provide your oncology and hematology care.   If you have a lab appointment with the Cancer Center, please go directly to the Cancer Center and check in at the registration area.   Wear comfortable clothing and clothing appropriate for easy access to any Portacath or PICC line.   We strive to give you quality time with your provider. You may need to reschedule your appointment if you arrive late (15 or more minutes).  Arriving late affects you and other patients whose appointments are after yours.  Also, if you miss three or more appointments without notifying the office, you may be dismissed from the clinic at the provider's discretion.      For prescription refill requests, have your pharmacy contact our office and allow 72 hours for refills to be completed.    Today you received the following chemotherapy and/or immunotherapy agents: daratumumab-hyaluronidase-fihj (DARZALEX FASPRO       To help prevent nausea and vomiting after your treatment, we encourage you to take your nausea medication as directed.  BELOW ARE SYMPTOMS THAT SHOULD BE REPORTED IMMEDIATELY: *FEVER GREATER THAN 100.4 F (38 C) OR HIGHER *CHILLS OR SWEATING *NAUSEA AND VOMITING THAT IS NOT CONTROLLED WITH YOUR NAUSEA MEDICATION *UNUSUAL SHORTNESS OF BREATH *UNUSUAL BRUISING OR BLEEDING *URINARY PROBLEMS (pain or burning when urinating, or frequent urination) *BOWEL PROBLEMS (unusual diarrhea, constipation, pain near the anus) TENDERNESS IN MOUTH AND THROAT WITH OR WITHOUT PRESENCE OF ULCERS (sore throat, sores in mouth, or a toothache) UNUSUAL RASH, SWELLING OR PAIN  UNUSUAL VAGINAL DISCHARGE OR ITCHING   Items with * indicate a potential emergency and should be followed up as soon as possible or go to the Emergency Department if any problems should occur.  Please show the  CHEMOTHERAPY ALERT CARD or IMMUNOTHERAPY ALERT CARD at check-in to the Emergency Department and triage nurse.  Should you have questions after your visit or need to cancel or reschedule your appointment, please contact [facility] - A DEPT OF [facility]  Dept: [phone]  and follow the prompts.  Office hours are 8:00 a.m. to 4:30 p.m. Monday - Friday. Please note that voicemails left after 4:00 p.m. may not be returned until the following business day.  We are closed weekends and major holidays. You have access to a nurse at all times for urgent questions. Please call the main number to the clinic Dept: [phone] and follow the prompts.   For any non-urgent questions, you may also contact your provider using MyChart. We now offer e-Visits for anyone 63 and older to request care online for non-urgent symptoms. For details visit [email or web address].   Also download the MyChart app! Go to the app store, search "MyChart", open the app, select [facility], and log in with your MyChart username and password.

## 2024-08-21 ENCOUNTER — Other Ambulatory Visit: Payer: Self-pay

## 2024-08-24 ENCOUNTER — Inpatient Hospital Stay

## 2024-08-24 ENCOUNTER — Inpatient Hospital Stay: Admitting: Hematology

## 2024-08-24 VITALS — BP 123/64 | HR 67 | Temp 97.6°F | Resp 18

## 2024-08-24 DIAGNOSIS — Z5112 Encounter for antineoplastic immunotherapy: Secondary | ICD-10-CM | POA: Diagnosis not present

## 2024-08-24 DIAGNOSIS — Z7189 Other specified counseling: Secondary | ICD-10-CM

## 2024-08-24 DIAGNOSIS — C9000 Multiple myeloma not having achieved remission: Secondary | ICD-10-CM

## 2024-08-24 LAB — CBC WITH DIFFERENTIAL (CANCER CENTER ONLY)
Abs Immature Granulocytes: 0.02 K/uL (ref 0.00–0.07)
Basophils Absolute: 0 K/uL (ref 0.0–0.1)
Basophils Relative: 0 %
Eosinophils Absolute: 0.1 K/uL (ref 0.0–0.5)
Eosinophils Relative: 3 %
HCT: 41.5 % (ref 39.0–52.0)
Hemoglobin: 13.9 g/dL (ref 13.0–17.0)
Immature Granulocytes: 1 %
Lymphocytes Relative: 16 %
Lymphs Abs: 0.6 K/uL — ABNORMAL LOW (ref 0.7–4.0)
MCH: 31.9 pg (ref 26.0–34.0)
MCHC: 33.5 g/dL (ref 30.0–36.0)
MCV: 95.2 fL (ref 80.0–100.0)
Monocytes Absolute: 0.4 K/uL (ref 0.1–1.0)
Monocytes Relative: 9 %
Neutro Abs: 2.7 K/uL (ref 1.7–7.7)
Neutrophils Relative %: 71 %
Platelet Count: 110 K/uL — ABNORMAL LOW (ref 150–400)
RBC: 4.36 MIL/uL (ref 4.22–5.81)
RDW: 14.6 % (ref 11.5–15.5)
WBC Count: 3.8 K/uL — ABNORMAL LOW (ref 4.0–10.5)
nRBC: 0 % (ref 0.0–0.2)

## 2024-08-24 MED ORDER — DARATUMUMAB-HYALURONIDASE-FIHJ 1800-30000 MG-UT/15ML ~~LOC~~ SOLN
1800.0000 mg | Freq: Once | SUBCUTANEOUS | Status: AC
  Administered 2024-08-24: 1800 mg via SUBCUTANEOUS
  Filled 2024-08-24: qty 15

## 2024-08-24 MED ORDER — DEXAMETHASONE 6 MG PO TABS
20.0000 mg | ORAL_TABLET | Freq: Once | ORAL | Status: AC
  Administered 2024-08-24: 20 mg via ORAL
  Filled 2024-08-24: qty 2

## 2024-08-24 MED ORDER — DIPHENHYDRAMINE HCL 25 MG PO CAPS
50.0000 mg | ORAL_CAPSULE | Freq: Once | ORAL | Status: AC
  Administered 2024-08-24: 50 mg via ORAL
  Filled 2024-08-24: qty 2

## 2024-08-24 MED ORDER — ACETAMINOPHEN 325 MG PO TABS
650.0000 mg | ORAL_TABLET | Freq: Once | ORAL | Status: AC
  Administered 2024-08-24: 650 mg via ORAL
  Filled 2024-08-24: qty 2

## 2024-08-24 NOTE — Patient Instructions (Signed)
 [facility] - A DEPT OF [facility]  Discharge Instructions: Thank you for choosing [facility] to provide your oncology and hematology care.   If you have a lab appointment with the Cancer Center, please go directly to the Cancer Center and check in at the registration area.   Wear comfortable clothing and clothing appropriate for easy access to any Portacath or PICC line.   We strive to give you quality time with your provider. You may need to reschedule your appointment if you arrive late (15 or more minutes).  Arriving late affects you and other patients whose appointments are after yours.  Also, if you miss three or more appointments without notifying the office, you may be dismissed from the clinic at the provider's discretion.      For prescription refill requests, have your pharmacy contact our office and allow 72 hours for refills to be completed.    Today you received the following chemotherapy and/or immunotherapy agents: daratumumab-hyaluronidase-fihj (DARZALEX FASPRO       To help prevent nausea and vomiting after your treatment, we encourage you to take your nausea medication as directed.  BELOW ARE SYMPTOMS THAT SHOULD BE REPORTED IMMEDIATELY: *FEVER GREATER THAN 100.4 F (38 C) OR HIGHER *CHILLS OR SWEATING *NAUSEA AND VOMITING THAT IS NOT CONTROLLED WITH YOUR NAUSEA MEDICATION *UNUSUAL SHORTNESS OF BREATH *UNUSUAL BRUISING OR BLEEDING *URINARY PROBLEMS (pain or burning when urinating, or frequent urination) *BOWEL PROBLEMS (unusual diarrhea, constipation, pain near the anus) TENDERNESS IN MOUTH AND THROAT WITH OR WITHOUT PRESENCE OF ULCERS (sore throat, sores in mouth, or a toothache) UNUSUAL RASH, SWELLING OR PAIN  UNUSUAL VAGINAL DISCHARGE OR ITCHING   Items with * indicate a potential emergency and should be followed up as soon as possible or go to the Emergency Department if any problems should occur.  Please show the  CHEMOTHERAPY ALERT CARD or IMMUNOTHERAPY ALERT CARD at check-in to the Emergency Department and triage nurse.  Should you have questions after your visit or need to cancel or reschedule your appointment, please contact [facility] - A DEPT OF [facility]  Dept: [phone]  and follow the prompts.  Office hours are 8:00 a.m. to 4:30 p.m. Monday - Friday. Please note that voicemails left after 4:00 p.m. may not be returned until the following business day.  We are closed weekends and major holidays. You have access to a nurse at all times for urgent questions. Please call the main number to the clinic Dept: [phone] and follow the prompts.   For any non-urgent questions, you may also contact your provider using MyChart. We now offer e-Visits for anyone 63 and older to request care online for non-urgent symptoms. For details visit [email or web address].   Also download the MyChart app! Go to the app store, search "MyChart", open the app, select [facility], and log in with your MyChart username and password.

## 2024-08-24 NOTE — Progress Notes (Signed)
 HEMATOLOGY ONCOLOGY PROGRESS NOTE  Date of service: 08/24/2024  Patient Care Team: [name], MD as PCP - General [name], MD as PCP - Advanced Heart Failure (Cardiology) [name], MD as PCP - Cardiology (Cardiology) [name], MD as Attending Physician (Neurosurgery) [name], MD as Consulting Physician (Dermatology) [name], MD as Consulting Physician (Pulmonary Disease) Patel, [name], DO as Consulting Physician (Neurology)  CHIEF COMPLAINT/PURPOSE OF CONSULTATION: Follow-up for continued evaluation and management of multiple myeloma.  HISTORY OF PRESENTING ILLNESS:  [name] [name] Is a 83 y.o. male is here for continued evaluation and management of multiple myeloma.He was last seen by Dr. Onesimo on 07/27/2024. In the interim, he started Dara/Dex therapy.  He is unaccompanied for this visit.   [name] [name] reports his energy levels are overall stable.  He does have persistent fatigue but he continues to complete his daily routines on his own and even tries to exercise as tolerated.  His appetite is unchanged.  His weight is overall stable but does oscillate 3 or 4 pounds with each visit.  He denies nausea, vomiting or any bowel habit changes.  He denies easy bruising or overt signs of bleeding.  He has chronic neuropathic pain involving his lower legs.  He takes gabapentin  and oxycodone  as needed for pain management.  He denies fevers, chills, night sweats, shortness of breath, chest pain or cough.  He has no other complaints.  Rest of the 10 point ROS as below.   SUMMARY OF ONCOLOGIC HISTORY: Oncology History  Multiple myeloma not having achieved remission (HCC)  05/17/2019 Initial Diagnosis   Multiple myeloma not having achieved remission (HCC)   05/22/2019 - 07/17/2019 Chemotherapy   The patient had dexamethasone  (DECADRON ) tablet 20 mg, 20 mg, Oral,  Once, 2 of 2 cycles Administration: 20 mg (05/29/2019), 10 mg (06/12/2019) lenalidomide   (REVLIMID ) 15 MG capsule, 15 mg, Oral, Daily, 1 of 1 cycle, Start date: 07/05/2019, End date: 10/02/2019 bortezomib  SQ (VELCADE ) chemo injection 2.75 mg, 1.3 mg/m2 = 2.75 mg, Subcutaneous,  Once, 3 of 3 cycles Dose modification: 1 mg/m2 (original dose 1.3 mg/m2, Cycle 2, Reason: Other (see comments)) Administration: 2.75 mg (06/12/2019), 2.75 mg (05/25/2019), 2.75 mg (05/29/2019), 2.75 mg (06/01/2019), 2.75 mg (05/22/2019), 2.75 mg (06/15/2019), 2.75 mg (06/19/2019), 2 mg (06/22/2019), 2.75 mg (07/03/2019), 2.75 mg (07/10/2019)  for chemotherapy treatment.    07/27/2024 -  Chemotherapy   Patient is on Treatment Plan : MYELOMA Daratumumab  SQ q28d     Malignant neoplasm metastatic to bone (HCC)  05/17/2019 Initial Diagnosis   Bone metastases (HCC)   05/22/2019 - 07/17/2019 Chemotherapy   The patient had dexamethasone  (DECADRON ) tablet 20 mg, 20 mg, Oral,  Once, 2 of 2 cycles Administration: 20 mg (05/29/2019), 10 mg (06/12/2019) lenalidomide  (REVLIMID ) 15 MG capsule, 15 mg, Oral, Daily, 1 of 1 cycle, Start date: 07/05/2019, End date: 10/02/2019 bortezomib  SQ (VELCADE ) chemo injection 2.75 mg, 1.3 mg/m2 = 2.75 mg, Subcutaneous,  Once, 3 of 3 cycles Dose modification: 1 mg/m2 (original dose 1.3 mg/m2, Cycle 2, Reason: Other (see comments)) Administration: 2.75 mg (06/12/2019), 2.75 mg (05/25/2019), 2.75 mg (05/29/2019), 2.75 mg (06/01/2019), 2.75 mg (05/22/2019), 2.75 mg (06/15/2019), 2.75 mg (06/19/2019), 2 mg (06/22/2019), 2.75 mg (07/03/2019), 2.75 mg (07/10/2019)  for chemotherapy treatment.      INTERVAL HISTORY: [name] [name] is a 83 y.o. male who is here today for continued evaluation and management of multiple myeloma.   he was last seen by me on 08/07/2024; at  the time he mentioned experiencing new bone pain in his shoulders, back, ribs, and legs. He mentioned losing sleep over the bone pain in his ribs.  Today, he notes some pain in his abdomen and having a hard time sleeping. He expressed getting up at 4 am  every morning due to insomnia.   Starting second cycle of daratumumab  treatment today.   He received his flu vaccine.   Denies any new lumps/bumps, or leg swelling  REVIEW OF SYSTEMS:   10 Point review of systems of done and is negative except as noted above.  MEDICAL HISTORY Past Medical History:  Diagnosis Date   Ascending aortic aneurysm    Bicuspid aortic valve    Cancer (HCC)    CHF NYHA class I (no symptoms from ordinary activities), acute, diastolic (HCC)    Dysrhythmia 7990   A fib   Fatty liver    mild   Fracture    left proximal humerus   GERD (gastroesophageal reflux disease)    GI bleeding 07/21/2018   post polypectomy   Hemorrhoids    HTN (hypertension)    Hypercholesteremia    Hypokalemia    Internal hemorrhoids    LBP (low back pain)    Moderate aortic stenosis    Osteoarthritis    Paroxysmal atrial fibrillation (HCC)    a. new onset Afib in 07/2008. He underwent ibutilide cardioversion successfully. b. Recurrence 01/2013 s/p TEE/DCCV - was on Xarelto  but he stopped it as he was convinced it was causing joint pn. c. Recurrence 01/2016 - spont conv to NSR. Pt took Eliquis  x1 mo then declined further anticoag. d. Recurrence 07/2016.   Pneumonia    Tubular adenoma of colon     SURGICAL HISTORY Past Surgical History:  Procedure Laterality Date   BACK SURGERY  x12 years ago   CARDIAC CATHETERIZATION  2020   CARDIAC VALVE REPLACEMENT  2020   CARDIOVERSION N/A 01/26/2013   Procedure: CARDIOVERSION;  Surgeon: Ezra GORMAN Shuck, MD;  Location: [facility] ENDOSCOPY;  Service: Cardiovascular;  Laterality: N/A;   CARDIOVERSION N/A 10/28/2017   Procedure: CARDIOVERSION;  Surgeon: Shuck Ezra GORMAN, MD;  Location: [facility] ENDOSCOPY;  Service: Cardiovascular;  Laterality: N/A;   CARDIOVERSION N/A 03/03/2018   Procedure: CARDIOVERSION;  Surgeon: Pietro Redell GORMAN, MD;  Location: [facility] ENDOSCOPY;  Service: Cardiovascular;  Laterality: N/A;   CARDIOVERSION N/A 09/19/2019   Procedure:  CARDIOVERSION;  Surgeon: Shuck Ezra GORMAN, MD;  Location: [facility] ENDOSCOPY;  Service: Cardiovascular;  Laterality: N/A;   COLONOSCOPY     COLONOSCOPY  07/17/2018   at [facility]   HEMORRHOID SURGERY     INGUINAL HERNIA REPAIR Left 03/18/2020   Procedure: OPEN LEFT INGUINAL HERNIA REPAIR;  Surgeon: Ethyl Lenis, MD;  Location: [facility] OR;  Service: General;  Laterality: Left;   LUMBAR LAMINECTOMY     ORIF HUMERUS FRACTURE Left 01/15/2020   Procedure: OPEN REDUCTION INTERNAL FIXATION (ORIF) PROXIMAL HUMERUS FRACTURE;  Surgeon: Sharl Selinda Dover, MD;  Location: [facility] OR;  Service: Orthopedics;  Laterality: Left;   POLYPECTOMY     RIGHT HEART CATH N/A 08/20/2019   Procedure: RIGHT HEART CATH;  Surgeon: Shuck Ezra GORMAN, MD;  Location: [facility] INVASIVE CV LAB;  Service: Cardiovascular;  Laterality: N/A;   RIGHT/LEFT HEART CATH AND CORONARY ANGIOGRAPHY N/A 03/07/2019   Procedure: RIGHT/LEFT HEART CATH AND CORONARY ANGIOGRAPHY;  Surgeon: Verlin Lonni BIRCH, MD;  Location: [facility] INVASIVE CV LAB;  Service: Cardiovascular;  Laterality: N/A;   TAVAR  04/2019   TEE WITHOUT CARDIOVERSION  N/A 01/26/2013   Procedure: TRANSESOPHAGEAL ECHOCARDIOGRAM (TEE);  Surgeon: Ezra GORMAN Shuck, MD;  Location: [facility] ENDOSCOPY;  Service: Cardiovascular;  Laterality: N/A;   TEE WITHOUT CARDIOVERSION N/A 10/28/2017   Procedure: TRANSESOPHAGEAL ECHOCARDIOGRAM (TEE);  Surgeon: Shuck Ezra GORMAN, MD;  Location: [facility] ENDOSCOPY;  Service: Cardiovascular;  Laterality: N/A;   TEE WITHOUT CARDIOVERSION N/A 05/08/2019   Procedure: TRANSESOPHAGEAL ECHOCARDIOGRAM (TEE);  Surgeon: Verlin Lonni BIRCH, MD;  Location: [facility] INVASIVE CV LAB;  Service: Open Heart Surgery;  Laterality: N/A;   TEE WITHOUT CARDIOVERSION N/A 09/19/2019   Procedure: TRANSESOPHAGEAL ECHOCARDIOGRAM (TEE);  Surgeon: Shuck Ezra GORMAN, MD;  Location: [facility] ENDOSCOPY;  Service: Cardiovascular;  Laterality: N/A;   TRANSCATHETER AORTIC VALVE REPLACEMENT, TRANSFEMORAL N/A 05/08/2019   Procedure:  TRANSCATHETER AORTIC VALVE REPLACEMENT, TRANSFEMORAL;  Surgeon: Verlin Lonni BIRCH, MD;  Location: [facility] INVASIVE CV LAB;  Service: Open Heart Surgery;  Laterality: N/A;    SOCIAL HISTORY Social History[1]  Social History   Social History Narrative   Patient lives in [place] w/ his wife. 2025/   He is a native of [place]. 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   Right-handed   Caffeine: 2 cups coffee per day   Two story home    SOCIAL DRIVERS OF HEALTH SDOH Screenings   Food Insecurity: No Food Insecurity (06/14/2024)  Housing: Unknown (06/14/2024)  Transportation Needs: No Transportation Needs (06/14/2024)  Utilities: Not At Risk (06/14/2024)  Alcohol Screen: Low Risk (06/14/2024)  Depression (PHQ2-9): Low Risk (08/03/2024)  Financial Resource Strain: Low Risk (06/14/2024)  Physical Activity: Inactive (06/14/2024)  Social Connections: Moderately Integrated (06/14/2024)  Stress: No Stress Concern Present (06/14/2024)  Tobacco Use: Low Risk (07/16/2024)  Health Literacy: Adequate Health Literacy (06/14/2024)     FAMILY HISTORY Family History  Problem Relation Age of Onset   Colon cancer Mother 12   Hypertension Other    Coronary artery disease Neg Hx    Colon polyps Neg Hx    Esophageal cancer Neg Hx    Rectal cancer Neg Hx    Stomach cancer Neg Hx      ALLERGIES: is allergic to xarelto  [rivaroxaban ], corticosteroids, ramipril , zolpidem, and benazepril .  MEDICATIONS  Current Outpatient Medications  Medication Sig Dispense Refill   acyclovir  (ZOVIRAX ) 400 MG tablet Take 1 tablet (400 mg total) by mouth 2 (two) times daily. (Patient not taking: Reported on 07/18/2024) 60 tablet 11   aspirin  81 MG chewable tablet CHEW 1 TABLET BY MOUTH DAILY. 90 tablet 1   Carboxymethylcellul-Glycerin (LUBRICATING EYE DROPS OP) Place 1 drop into both eyes daily as needed (dry eyes).     Cholecalciferol  (VITAMIN D ) 50 MCG (2000 UT) tablet Take 2,000  Units by mouth daily.     dexamethasone  (DECADRON ) 4 MG tablet Take 1 tablet (4 mg total) by mouth daily. Take for 2 days starting the night of chemotherapy. 20 tablet 4   diclofenac  Sodium (VOLTAREN ) 1 % GEL Apply 1 application topically 2 (two) times daily as needed (pain.).     ELIQUIS  5 MG TABS tablet TAKE 1 TABLET BY MOUTH TWICE A DAY 180 tablet 0   gabapentin  (NEURONTIN ) 300 MG capsule TAKE 1 CAPSULE BY MOUTH EVERYDAY AT BEDTIME 90 capsule 3   ipratropium (ATROVENT ) 0.06 % nasal spray Place 2 sprays into the nose 3 (three) times daily. 15 mL 2   lenalidomide  (REVLIMID ) 10 MG capsule Take 1 capsule (10 mg total) by mouth daily. Take 1 capsule (10 mg total) by mouth  daily for 21 days. Take 7 days off. Repeat cycle. 21 capsule 0   lidocaine -prilocaine  (EMLA ) cream Apply to affected area once 30 g 3   Magnesium  Oxide -Mg Supplement 250 MG TABS Take 250 mg by mouth daily.     metroNIDAZOLE  (METROCREAM ) 0.75 % cream Apply 1 application  topically 2 (two) times daily as needed (rash).     ondansetron  (ZOFRAN ) 8 MG tablet Take 1 tablet (8 mg total) by mouth every 8 (eight) hours as needed for nausea or vomiting. 30 tablet 1   oxyCODONE  (OXY IR/ROXICODONE ) 5 MG immediate release tablet Take 1-2 tablets (5-10 mg total) by mouth 2 (two) times daily as needed for severe pain (pain score 7-10). 120 tablet 0   prochlorperazine  (COMPAZINE ) 10 MG tablet Take 1 tablet (10 mg total) by mouth every 6 (six) hours as needed for nausea or vomiting. 30 tablet 1   senna-docusate (SENOKOT-S) 8.6-50 MG tablet Take 2 tablets by mouth daily as needed for mild constipation or moderate constipation.     spironolactone  (ALDACTONE ) 25 MG tablet TAKE 1 TABLET BY MOUTH EVERY DAY 90 tablet 3   Vitamin D , Ergocalciferol , (DRISDOL ) 1.25 MG (50000 UNIT) CAPS capsule TAKE 1 CAPSULE BY MOUTH ONE TIME PER WEEK 12 capsule 3   No current facility-administered medications for this visit.   Facility-Administered Medications Ordered in  Other Visits  Medication Dose Route Frequency Provider Last Rate Last Admin   daratumumab -hyaluronidase -fihj (DARZALEX  FASPRO) 1800-30000 MG-UT/15ML chemo SQ injection 1,800 mg  1,800 mg Subcutaneous Once Danetta Prom Kishore, MD        PHYSICAL EXAMINATION: ECOG PERFORMANCE STATUS: 1 - Symptomatic but completely ambulatory VITALS: There were no vitals filed for this visit. There were no vitals filed for this visit. There is no height or weight on file to calculate BMI.  GENERAL: alert, in no acute distress and comfortable SKIN: no acute rashes, no significant lesions EYES: conjunctiva are pink and non-injected, sclera anicteric OROPHARYNX: MMM, no exudates, no oropharyngeal erythema or ulceration NECK: supple, no JVD LYMPH:  no palpable lymphadenopathy in the cervical, axillary or inguinal regions LUNGS: clear to auscultation b/l with normal respiratory effort HEART: regular rate & rhythm ABDOMEN:  normoactive bowel sounds , non tender, not distended, no hepatosplenomegaly Extremity: no pedal edema PSYCH: alert & oriented x 3 with fluent speech NEURO: no focal motor/sensory deficits  LABORATORY DATA:   I have reviewed the data as listed     Latest Ref Rng & Units 08/24/2024    8:46 AM 08/17/2024   11:07 AM 08/10/2024    8:49 AM  CBC EXTENDED  WBC 4.0 - 10.5 K/uL 3.8  3.9  4.1   RBC 4.22 - 5.81 MIL/uL 4.36  4.31  4.29   Hemoglobin 13.0 - 17.0 g/dL 86.0  86.2  86.3   HCT 39.0 - 52.0 % 41.5  40.8  40.7   Platelets 150 - 400 K/uL 110  97  94   NEUT# 1.7 - 7.7 K/uL 2.7  2.5  3.0   Lymph# 0.7 - 4.0 K/uL 0.6  0.6  0.5        Latest Ref Rng & Units 08/10/2024    8:49 AM 07/27/2024    2:19 PM 06/08/2024    9:44 AM  CMP  Glucose 70 - 99 mg/dL 898  887  99   BUN 8 - 23 mg/dL 29  19  18    Creatinine 0.61 - 1.24 mg/dL 8.81  8.86  8.77   Sodium 135 -  145 mmol/L 138  139  140   Potassium 3.5 - 5.1 mmol/L 4.4  4.3  4.1   Chloride 98 - 111 mmol/L 104  105  105   CO2 22 - 32  mmol/L 25  29  32   Calcium  8.9 - 10.3 mg/dL 8.8  9.3  9.1   Total Protein 6.5 - 8.1 g/dL 5.9  6.1  6.4   Total Bilirubin 0.0 - 1.2 mg/dL 0.6  0.6  0.8   Alkaline Phos 38 - 126 U/L 114  79  78   AST 15 - 41 U/L 27  18  24    ALT 0 - 44 U/L 47  19  28               05/24/2019 Bone Marrow Biopsy     04/16/2019 Surgical Pathology:    Surgical Pathology  CASE: [id number]  PATIENT: [facility]  Bone Marrow Report      Clinical History: Multiple myeloma, remission status unspecified (HCC)  (BH)   DIAGNOSIS:   BONE MARROW, ASPIRATE, CLOT, CORE:  -Variably cellular bone marrow with trilineage hematopoiesis and 2%  plasma cells  -See comment   PERIPHERAL BLOOD:  -Slight thrombocytopenia   COMMENT:   The bone marrow is variably cellular with trilineage hematopoiesis  including abundant megakaryocytes in the more cellular areas.  In this  background, the plasma cells represent 2% of all cells in the aspirate  with interstitial cells and a few very minute clusters in the  clot/biopsy sections.  The latter in particular display kappa light  chain excess/restriction most suggestive of minimal residual plasma cell  neoplasm despite limited findings.  Correlation with cytogenetic and  FISH studies is recommended.   RADIOGRAPHIC STUDIES: I have personally reviewed the radiological images as listed and agreed with the findings in the report. CT BONE MARROW BIOPSY & ASPIRATION Addendum Date: 07/16/2024 ADDENDUM REPORT: 07/16/2024 11:16 ADDENDUM: 50 mcg of intravenous fentanyl  and 1.5 mg intravenous Versed  were administered for a total sedation time of 10 minutes. Electronically Signed   By: Cordella Banner   On: 07/16/2024 11:16   Result Date: 07/16/2024 CLINICAL DATA:  Elevated serum protein. Evaluate for myeloma multiple myeloma. Bone marrow biopsy for evaluation of myeloma progression. EXAM: CT-guided bone marrow biopsy TECHNIQUE: CT pelvis CONTRAST:  None  RADIOPHARMACEUTICALS:  None COMPARISON:  None FINDINGS: The patient was placed in prone position on the CT gantry. Radiopaque markers were placed on the patient's skin and initial imaging of the pelvis was performed. The patient's skin was then prepped and draped in the usual sterile fashion. Moderate sedation was provided for by the nursing staff under my supervision utilizing intravenous Versed  and fentanyl . The nurse had no other duties other than monitoring the patient and providing sedation during the procedure. I was present for the entire procedure. 1% lidocaine  was used to infiltrate the skin at the access site prior to a stab incision. Local anesthesia was then used to infiltrate the region of soft tissue from the skin to the right iliac bone. The bone marrow needle was then advanced and imaging demonstrated the needle tip to be in the cortex of the right iliac bone. The bone was then penetrated and a sample was obtained. After the sample was evaluated, approximately 5 mL of heparinized bone marrow sample was obtained by aspiration. A core sample was then obtained. Multiple attempts at sampling was performed in order to get 2 1 cm segments. All needles were then removed from  the patient. Sterile dressing was applied. IMPRESSION: Satisfactory core needle biopsy and aspiration of the right iliac bone marrow under CT guidance. Electronically Signed: By: Cordella Banner On: 07/16/2024 11:13   NM PET Image Restage (PS) Whole Body Result Date: 06/04/2024 EXAM: PET CT WHOLE BODY 05/31/2024 09:42:47 AM TECHNIQUE: RADIOPHARMACEUTICAL: 7.99 mCi F-18 FDG Uptake time 60 minutes. Glucose level 93 mg/dl. PET imaging was acquired from the skull vertex through the feet. Non-contrast enhanced computed tomography was obtained for attenuation correction and anatomic localization. COMPARISON: 10/15/2021 CLINICAL HISTORY: Hematologic malignancy, assess treatment response; evaluation for progrression of myeloma. Multiple  myeloma not having achieved remission. FINDINGS: HEAD AND NECK: No cervical nodal hypermetabolism. No cervical adenopathy. Bilateral carotid atherosclerosis. CHEST: No thoracic nodal or pulmonary parenchymal hypermetabolism. Status post TAVR. Upper normal ascending aortic caliber at 3.9 cm. Aortic and coronary artery calcification. Centrilobular emphysema. A 2 mm subpleural right upper lobe pulmonary nodule is unchanged and below PET resolution. ABDOMEN AND PELVIS: No abdominal or pelvic nodal or parenchymal hypermetabolism. Normal adrenal glands. Bilateral low density renal lesions are likely cysts and do not warrant specific imaging follow up. Abdominal aortic atherosclerosis. Large colonic stool burden. Mild prostatomegaly. BONES AND SOFT TISSUE: New multifocal hypermetabolic osseous foci. Example posterior right third rib at SUV 5.0 on 73/4. Within the anterior second left rib at 1.2 cm and SUV 12.3 on 87/4. The right supra-acetabular/iliac lesion is no longer tracer avid, measuring SUV 1.6 today. Relatively similar in size and CT morphology at maximally 5.9 cm. No abnormal activity within the extremities. Left proximal humerus fixation. Osteopenia. IMPRESSION: 1. Findings consistent with progression of multiple myeloma with new multifocal hypermetabolic osseous lesions. 2. No evidence of hypermetabolic extraosseous myeloma. Electronically signed by: Rockey Kilts MD 06/04/2024 10:44 AM EDT RP Workstation: [id number]    ASSESSMENT & PLAN:  83 y.o. male with   #1 Multiple myeloma: --Initially treated with Velcade /Revlimid /Dexamethasone , started on 05/22/2019. Added Zometa  on 07/03/2019.  Discontinued Velcade  and Revlimid  after 07/10/2019 due to grade 2-3 neuropathy. He continued on Zometa  infusions --PET scan from 10/15/2021 showed persistent low level radiotracer uptake associated with the large lucent lesion within the superior right acetabulum. Additionally, there is a new focal area of increased  radiotracer uptake above the background low level activity within this lesion with SUV max of 3.92. On the previous exam there was relatively homogeneous low level uptake within this lesion within SUV max of 2.43. Imaging findings are concerning for residual metabolically active tumor. --Bone marrow biopsy on 11/03/21 showed 2 % plasma cells.  --Received palliative radiation to right acetabulum from 11/30/2021-12/11/2021. Received 25 Gy in 10 Fx.  --Resumed dose reduced Revlmid 5 mg PO daily 21 days on/7 days off every 28 days on 01/03/2022 --PET/CT scan from 05/31/2024 showed progression of multiple myeloma with new multifocal hypermetabolic osseous lesions. Kappa light chain had increased to 477.8 with ratio of 45.08 on 06/08/2024.  --Switched to Dara/Dex on 07/27/2024.    #2 Severe aortic stenosis with bicuspid aortic valve -10/26/2018 ECHO revealed AVA at 0.8 cm2 and LV EF of 60-65% -05/08/2019 pt had a Transfemoral Transcatheter Aortic Valve Replacement   #3 B/L lower extremity neuropathy -Secondary from previous myeloma treatments (velcade ) and also radiculopathy due to chronic back issues -Currently takes oxycodone  and gabapentin     PLAN: - Discussed lab results on 08/24/2024 in detail with patient: -Hemoglobin has improved, is now up to 13.9 -M Spike protein has decreased by nearly 90%  -Kappa Light chains have gone down from 500  to 50, decreased by 90%. Good, early response to treatment.  -Exercising in a warm environment, be sure not to spend too much time in the cold.  -Will take December 26th off of treatment   FOLLOW-UP in 1-2 weeks for labs and follow-up with Dr. Onesimo.  The total time spent in the appointment was *** minutes* .  All of the patient's questions were answered and the patient knows to call the clinic with any problems, questions, or concerns.  Emaline Onesimo MD MS AAHIVMS [facility] Hematology/Oncology Physician [facility]  *Total Encounter Time as  defined by the Centers for Medicare and Medicaid Services includes, in addition to the face-to-face time of a patient visit (documented in the note above) non-face-to-face time: obtaining and reviewing outside history, ordering and reviewing medications, tests or procedures, care coordination (communications with other health care professionals or caregivers) and documentation in the medical record.  I, Alan Blowers, acting as a neurosurgeon for Emaline Onesimo, MD.,have documented all relevant documentation on the behalf of Emaline Onesimo, MD,as directed by  Emaline Onesimo, MD while in the presence of Emaline Onesimo, MD.  I have reviewed the above documentation for accuracy and completeness, and I agree with the above.  Emaline Onesimo, MD     [1]  Social History Tobacco Use   Smoking status: Never   Smokeless tobacco: Never  Vaping Use   Vaping status: Never Used  Substance Use Topics   Alcohol use: Not Currently   Drug use: No

## 2024-08-25 ENCOUNTER — Other Ambulatory Visit: Payer: Self-pay | Admitting: Internal Medicine

## 2024-08-27 LAB — KAPPA/LAMBDA LIGHT CHAINS
Kappa free light chain: 25.9 mg/L — ABNORMAL HIGH (ref 3.3–19.4)
Kappa, lambda light chain ratio: 8.93 — ABNORMAL HIGH (ref 0.26–1.65)
Lambda free light chains: 2.9 mg/L — ABNORMAL LOW (ref 5.7–26.3)

## 2024-08-28 LAB — MULTIPLE MYELOMA PANEL, SERUM
Albumin SerPl Elph-Mcnc: 3.4 g/dL (ref 2.9–4.4)
Albumin/Glob SerPl: 1.6 (ref 0.7–1.7)
Alpha 1: 0.2 g/dL (ref 0.0–0.4)
Alpha2 Glob SerPl Elph-Mcnc: 0.6 g/dL (ref 0.4–1.0)
B-Globulin SerPl Elph-Mcnc: 0.9 g/dL (ref 0.7–1.3)
Gamma Glob SerPl Elph-Mcnc: 0.5 g/dL (ref 0.4–1.8)
Globulin, Total: 2.2 g/dL (ref 2.2–3.9)
IgA: 26 mg/dL — ABNORMAL LOW (ref 61–437)
IgG (Immunoglobin G), Serum: 498 mg/dL — ABNORMAL LOW (ref 603–1613)
IgM (Immunoglobulin M), Srm: 12 mg/dL — ABNORMAL LOW (ref 15–143)
M Protein SerPl Elph-Mcnc: 0.2 g/dL — ABNORMAL HIGH
Total Protein ELP: 5.6 g/dL — ABNORMAL LOW (ref 6.0–8.5)

## 2024-08-31 ENCOUNTER — Inpatient Hospital Stay

## 2024-08-31 ENCOUNTER — Encounter: Payer: Self-pay | Admitting: Hematology

## 2024-08-31 VITALS — BP 147/73 | HR 64 | Temp 98.0°F | Resp 18 | Wt 161.0 lb

## 2024-08-31 DIAGNOSIS — Z5112 Encounter for antineoplastic immunotherapy: Secondary | ICD-10-CM | POA: Diagnosis not present

## 2024-08-31 DIAGNOSIS — Z7189 Other specified counseling: Secondary | ICD-10-CM

## 2024-08-31 DIAGNOSIS — C9000 Multiple myeloma not having achieved remission: Secondary | ICD-10-CM

## 2024-08-31 LAB — COMPREHENSIVE METABOLIC PANEL WITH GFR
ALT: 60 U/L — ABNORMAL HIGH (ref 0–44)
AST: 32 U/L (ref 15–41)
Albumin: 4 g/dL (ref 3.5–5.0)
Alkaline Phosphatase: 114 U/L (ref 38–126)
Anion gap: 9 (ref 5–15)
BUN: 22 mg/dL (ref 8–23)
CO2: 27 mmol/L (ref 22–32)
Calcium: 9 mg/dL (ref 8.9–10.3)
Chloride: 103 mmol/L (ref 98–111)
Creatinine, Ser: 1.14 mg/dL (ref 0.61–1.24)
GFR, Estimated: >60 mL/min
Glucose, Bld: 135 mg/dL — ABNORMAL HIGH (ref 70–99)
Potassium: 4 mmol/L (ref 3.5–5.1)
Sodium: 139 mmol/L (ref 135–145)
Total Bilirubin: 0.5 mg/dL (ref 0.0–1.2)
Total Protein: 5.8 g/dL — ABNORMAL LOW (ref 6.5–8.1)

## 2024-08-31 LAB — CBC WITH DIFFERENTIAL (CANCER CENTER ONLY)
Abs Immature Granulocytes: 0.01 K/uL (ref 0.00–0.07)
Basophils Absolute: 0 K/uL (ref 0.0–0.1)
Basophils Relative: 0 %
Eosinophils Absolute: 0.1 K/uL (ref 0.0–0.5)
Eosinophils Relative: 3 %
HCT: 40.6 % (ref 39.0–52.0)
Hemoglobin: 13.7 g/dL (ref 13.0–17.0)
Immature Granulocytes: 0 %
Lymphocytes Relative: 17 %
Lymphs Abs: 0.7 K/uL (ref 0.7–4.0)
MCH: 32 pg (ref 26.0–34.0)
MCHC: 33.7 g/dL (ref 30.0–36.0)
MCV: 94.9 fL (ref 80.0–100.0)
Monocytes Absolute: 0.4 K/uL (ref 0.1–1.0)
Monocytes Relative: 10 %
Neutro Abs: 2.8 K/uL (ref 1.7–7.7)
Neutrophils Relative %: 70 %
Platelet Count: 90 K/uL — ABNORMAL LOW (ref 150–400)
RBC: 4.28 MIL/uL (ref 4.22–5.81)
RDW: 14.6 % (ref 11.5–15.5)
WBC Count: 4 K/uL (ref 4.0–10.5)
nRBC: 0 % (ref 0.0–0.2)

## 2024-08-31 MED ORDER — DEXAMETHASONE 6 MG PO TABS
20.0000 mg | ORAL_TABLET | Freq: Once | ORAL | Status: AC
  Administered 2024-08-31: 20 mg via ORAL
  Filled 2024-08-31: qty 2

## 2024-08-31 MED ORDER — ACETAMINOPHEN 325 MG PO TABS
650.0000 mg | ORAL_TABLET | Freq: Once | ORAL | Status: AC
  Administered 2024-08-31: 650 mg via ORAL
  Filled 2024-08-31: qty 2

## 2024-08-31 MED ORDER — DARATUMUMAB-HYALURONIDASE-FIHJ 1800-30000 MG-UT/15ML ~~LOC~~ SOLN
1800.0000 mg | Freq: Once | SUBCUTANEOUS | Status: AC
  Administered 2024-08-31: 1800 mg via SUBCUTANEOUS
  Filled 2024-08-31: qty 15

## 2024-08-31 MED ORDER — DIPHENHYDRAMINE HCL 25 MG PO CAPS
50.0000 mg | ORAL_CAPSULE | Freq: Once | ORAL | Status: AC
  Administered 2024-08-31: 50 mg via ORAL
  Filled 2024-08-31: qty 2

## 2024-08-31 NOTE — Progress Notes (Signed)
 Pt ok for tx today with Plts 90 per Dr Onesimo

## 2024-08-31 NOTE — Patient Instructions (Signed)
 [facility] - A DEPT OF [facility]  Discharge Instructions: Thank you for choosing [facility] to provide your oncology and hematology care.   If you have a lab appointment with the Cancer Center, please go directly to the Cancer Center and check in at the registration area.   Wear comfortable clothing and clothing appropriate for easy access to any Portacath or PICC line.   We strive to give you quality time with your provider. You may need to reschedule your appointment if you arrive late (15 or more minutes).  Arriving late affects you and other patients whose appointments are after yours.  Also, if you miss three or more appointments without notifying the office, you may be dismissed from the clinic at the provider's discretion.      For prescription refill requests, have your pharmacy contact our office and allow 72 hours for refills to be completed.    Today you received the following chemotherapy and/or immunotherapy agents darzalex faspro      To help prevent nausea and vomiting after your treatment, we encourage you to take your nausea medication as directed.  BELOW ARE SYMPTOMS THAT SHOULD BE REPORTED IMMEDIATELY: *FEVER GREATER THAN 100.4 F (38 C) OR HIGHER *CHILLS OR SWEATING *NAUSEA AND VOMITING THAT IS NOT CONTROLLED WITH YOUR NAUSEA MEDICATION *UNUSUAL SHORTNESS OF BREATH *UNUSUAL BRUISING OR BLEEDING *URINARY PROBLEMS (pain or burning when urinating, or frequent urination) *BOWEL PROBLEMS (unusual diarrhea, constipation, pain near the anus) TENDERNESS IN MOUTH AND THROAT WITH OR WITHOUT PRESENCE OF ULCERS (sore throat, sores in mouth, or a toothache) UNUSUAL RASH, SWELLING OR PAIN  UNUSUAL VAGINAL DISCHARGE OR ITCHING   Items with * indicate a potential emergency and should be followed up as soon as possible or go to the Emergency Department if any problems should occur.  Please show the CHEMOTHERAPY ALERT CARD or  IMMUNOTHERAPY ALERT CARD at check-in to the Emergency Department and triage nurse.  Should you have questions after your visit or need to cancel or reschedule your appointment, please contact [facility] - A DEPT OF [facility]  Dept: [phone]  and follow the prompts.  Office hours are 8:00 a.m. to 4:30 p.m. Monday - Friday. Please note that voicemails left after 4:00 p.m. may not be returned until the following business day.  We are closed weekends and major holidays. You have access to a nurse at all times for urgent questions. Please call the main number to the clinic Dept: [phone] and follow the prompts.   For any non-urgent questions, you may also contact your provider using MyChart. We now offer e-Visits for anyone 15 and older to request care online for non-urgent symptoms. For details visit [email or web address].   Also download the MyChart app! Go to the app store, search "MyChart", open the app, select [place], and log in with your MyChart username and password.

## 2024-09-06 ENCOUNTER — Other Ambulatory Visit: Payer: Self-pay

## 2024-09-07 ENCOUNTER — Inpatient Hospital Stay

## 2024-09-07 ENCOUNTER — Inpatient Hospital Stay: Admitting: Physician Assistant

## 2024-09-09 ENCOUNTER — Other Ambulatory Visit: Payer: Self-pay

## 2024-09-09 ENCOUNTER — Emergency Department (HOSPITAL_BASED_OUTPATIENT_CLINIC_OR_DEPARTMENT_OTHER)

## 2024-09-09 ENCOUNTER — Emergency Department (HOSPITAL_BASED_OUTPATIENT_CLINIC_OR_DEPARTMENT_OTHER)
Admission: EM | Admit: 2024-09-09 | Discharge: 2024-09-09 | Disposition: A | Discharge status: Home / Self Care | Attending: Emergency Medicine | Admitting: Emergency Medicine

## 2024-09-09 ENCOUNTER — Encounter (HOSPITAL_BASED_OUTPATIENT_CLINIC_OR_DEPARTMENT_OTHER): Payer: Self-pay

## 2024-09-09 DIAGNOSIS — Z79899 Other long term (current) drug therapy: Secondary | ICD-10-CM | POA: Diagnosis not present

## 2024-09-09 DIAGNOSIS — R0602 Shortness of breath: Secondary | ICD-10-CM | POA: Diagnosis present

## 2024-09-09 DIAGNOSIS — Z7982 Long term (current) use of aspirin: Secondary | ICD-10-CM | POA: Insufficient documentation

## 2024-09-09 DIAGNOSIS — Z7969 Long term (current) use of other immunomodulators and immunosuppressants: Secondary | ICD-10-CM | POA: Insufficient documentation

## 2024-09-09 DIAGNOSIS — J210 Acute bronchiolitis due to respiratory syncytial virus: Secondary | ICD-10-CM | POA: Insufficient documentation

## 2024-09-09 DIAGNOSIS — Z5112 Encounter for antineoplastic immunotherapy: Secondary | ICD-10-CM | POA: Diagnosis not present

## 2024-09-09 DIAGNOSIS — I482 Chronic atrial fibrillation, unspecified: Secondary | ICD-10-CM | POA: Insufficient documentation

## 2024-09-09 DIAGNOSIS — Z8579 Personal history of other malignant neoplasms of lymphoid, hematopoietic and related tissues: Secondary | ICD-10-CM | POA: Insufficient documentation

## 2024-09-09 DIAGNOSIS — I10 Essential (primary) hypertension: Secondary | ICD-10-CM | POA: Insufficient documentation

## 2024-09-09 DIAGNOSIS — R059 Cough, unspecified: Secondary | ICD-10-CM | POA: Diagnosis present

## 2024-09-09 DIAGNOSIS — Z7901 Long term (current) use of anticoagulants: Secondary | ICD-10-CM | POA: Diagnosis not present

## 2024-09-09 LAB — DIFFERENTIAL
Abs Immature Granulocytes: 0.02 K/uL (ref 0.00–0.07)
Basophils Absolute: 0 K/uL (ref 0.0–0.1)
Basophils Relative: 0 %
Eosinophils Absolute: 0.1 K/uL (ref 0.0–0.5)
Eosinophils Relative: 3 %
Immature Granulocytes: 1 %
Lymphocytes Relative: 11 %
Lymphs Abs: 0.4 K/uL — ABNORMAL LOW (ref 0.7–4.0)
Monocytes Absolute: 0.5 K/uL (ref 0.1–1.0)
Monocytes Relative: 15 %
Neutro Abs: 2.3 K/uL (ref 1.7–7.7)
Neutrophils Relative %: 70 %

## 2024-09-09 LAB — CBC
HCT: 40.4 % (ref 39.0–52.0)
Hemoglobin: 13.6 g/dL (ref 13.0–17.0)
MCH: 32.7 pg (ref 26.0–34.0)
MCHC: 33.7 g/dL (ref 30.0–36.0)
MCV: 97.1 fL (ref 80.0–100.0)
Platelets: 100 K/uL — ABNORMAL LOW (ref 150–400)
RBC: 4.16 MIL/uL — ABNORMAL LOW (ref 4.22–5.81)
RDW: 14.9 % (ref 11.5–15.5)
WBC: 3.2 K/uL — ABNORMAL LOW (ref 4.0–10.5)
nRBC: 0 % (ref 0.0–0.2)

## 2024-09-09 LAB — BASIC METABOLIC PANEL WITH GFR
Anion gap: 8 (ref 5–15)
BUN: 20 mg/dL (ref 8–23)
CO2: 26 mmol/L (ref 22–32)
Calcium: 8.7 mg/dL — ABNORMAL LOW (ref 8.9–10.3)
Chloride: 104 mmol/L (ref 98–111)
Creatinine, Ser: 0.98 mg/dL (ref 0.61–1.24)
GFR, Estimated: >60 mL/min
Glucose, Bld: 125 mg/dL — ABNORMAL HIGH (ref 70–99)
Potassium: 4.1 mmol/L (ref 3.5–5.1)
Sodium: 138 mmol/L (ref 135–145)

## 2024-09-09 LAB — RESP PANEL BY RT-PCR (RSV, FLU A&B, COVID)  RVPGX2
Influenza A by PCR: NEGATIVE
Influenza B by PCR: NEGATIVE
Resp Syncytial Virus by PCR: POSITIVE — AB
SARS Coronavirus 2 by RT PCR: NEGATIVE

## 2024-09-09 LAB — TROPONIN T, HIGH SENSITIVITY
Troponin T High Sensitivity: 23 ng/L — ABNORMAL HIGH (ref 0–19)
Troponin T High Sensitivity: 25 ng/L — ABNORMAL HIGH (ref 0–19)

## 2024-09-09 MED ORDER — FENTANYL CITRATE (PF) 50 MCG/ML IJ SOSY
25.0000 ug | PREFILLED_SYRINGE | Freq: Once | INTRAMUSCULAR | Status: AC
  Administered 2024-09-09: 25 ug via INTRAVENOUS
  Filled 2024-09-09: qty 1

## 2024-09-09 MED ORDER — HYDROCODONE BIT-HOMATROP MBR 5-1.5 MG/5ML PO SOLN: 5.0000 mL | Freq: Four times a day (QID) | ORAL | 0 refills | Status: DC | PRN

## 2024-09-09 MED ORDER — GUAIFENESIN-CODEINE 100-10 MG/5ML PO SOLN
10.0000 mL | Freq: Once | ORAL | Status: AC
  Administered 2024-09-09: 10 mL via ORAL
  Filled 2024-09-09: qty 10

## 2024-09-09 MED ORDER — IPRATROPIUM-ALBUTEROL 0.5-2.5 (3) MG/3ML IN SOLN
3.0000 mL | Freq: Once | RESPIRATORY_TRACT | Status: AC
  Administered 2024-09-09: 3 mL via RESPIRATORY_TRACT
  Filled 2024-09-09: qty 3

## 2024-09-09 MED ORDER — SODIUM CHLORIDE 0.9 % IV BOLUS
500.0000 mL | Freq: Once | INTRAVENOUS | Status: AC
  Administered 2024-09-09: 500 mL via INTRAVENOUS

## 2024-09-09 NOTE — Progress Notes (Signed)
 PT walked on RA, Heart Rate and O2 Sats remained normal while walking.

## 2024-09-09 NOTE — ED Triage Notes (Signed)
 Reports cough and congestion for 3 days. Reports palpitations, chest pain when coughing.  Hx of A fib, chemo pt

## 2024-09-09 NOTE — ED Provider Notes (Signed)
 "  EMERGENCY DEPARTMENT AT [facility] Provider Note   CSN: [id number] Arrival date & time: 09/09/24  1620     Patient presents with: Palpitations and Cough   [name] [name] is a 83 y.o. male.   83 year old male with past medical history significant for multiple myeloma currently on chemo therapy weekly but missed his most recent session last week, A-fib on anticoagulant who presents today for concern of not feeling well for the past 3 days with cough, shortness of breath, decreased appetite, and worsening neuropathy pain.  Wife is at bedside.  He states typically his heart rate is around 60s however today he noticed that it was somewhat higher.  The history is provided by the patient. No language interpreter was used.       Prior to Admission medications  Medication Sig Start Date End Date Taking? Authorizing Provider  acyclovir  (ZOVIRAX ) 400 MG tablet Take 1 tablet (400 mg total) by mouth 2 (two) times daily. Patient not taking: Reported on 07/18/2024 06/21/24   Onesimo Emaline Brink, MD  aspirin  81 MG chewable tablet CHEW 1 TABLET BY MOUTH DAILY. 12/17/22   McLean, Dalton S, MD  Carboxymethylcellul-Glycerin (LUBRICATING EYE DROPS OP) Place 1 drop into both eyes daily as needed (dry eyes).    [provider]  Cholecalciferol  (VITAMIN D ) 50 MCG (2000 UT) tablet Take 2,000 Units by mouth daily.    [provider]  dexamethasone  (DECADRON ) 4 MG tablet Take 1 tablet (4 mg total) by mouth daily. Take for 2 days starting the night of chemotherapy. 06/21/24   Kale, Gautam Kishore, MD  diclofenac  Sodium (VOLTAREN ) 1 % GEL Apply 1 application topically 2 (two) times daily as needed (pain.).    [provider]  ELIQUIS  5 MG TABS tablet TAKE 1 TABLET BY MOUTH TWICE A DAY 07/30/24   [name], MD  gabapentin  (NEURONTIN ) 300 MG capsule TAKE 1 CAPSULE BY MOUTH EVERYDAY AT BEDTIME 02/16/24   Kale, Gautam Kishore, MD  ipratropium (ATROVENT ) 0.06 % nasal  spray PLACE 2 SPRAYS INTO THE NOSE 3 (THREE) TIMES DAILY. 08/26/24 08/26/25  Webb, Padonda B, FNP  lenalidomide  (REVLIMID ) 10 MG capsule Take 1 capsule (10 mg total) by mouth daily. Take 1 capsule (10 mg total) by mouth daily for 21 days. Take 7 days off. Repeat cycle. 08/08/24   Onesimo Emaline Brink, MD  lidocaine -prilocaine  (EMLA ) cream Apply to affected area once 06/21/24   Kale, Gautam Kishore, MD  Magnesium  Oxide -Mg Supplement 250 MG TABS Take 250 mg by mouth daily.    [provider]  metroNIDAZOLE  (METROCREAM ) 0.75 % cream Apply 1 application  topically 2 (two) times daily as needed (rash).    [provider]  ondansetron  (ZOFRAN ) 8 MG tablet Take 1 tablet (8 mg total) by mouth every 8 (eight) hours as needed for nausea or vomiting. 06/21/24   Onesimo Emaline Brink, MD  oxyCODONE  (OXY IR/ROXICODONE ) 5 MG immediate release tablet Take 1-2 tablets (5-10 mg total) by mouth 2 (two) times daily as needed for severe pain (pain score 7-10). 08/03/24   Onesimo Emaline Brink, MD  prochlorperazine  (COMPAZINE ) 10 MG tablet Take 1 tablet (10 mg total) by mouth every 6 (six) hours as needed for nausea or vomiting. 06/21/24   Onesimo Emaline Brink, MD  senna-docusate (SENOKOT-S) 8.6-50 MG tablet Take 2 tablets by mouth daily as needed for mild constipation or moderate constipation.    [provider]  spironolactone  (ALDACTONE ) 25 MG tablet TAKE 1 TABLET  BY MOUTH EVERY DAY 10/04/23   [name], MD  Vitamin D , Ergocalciferol , (DRISDOL ) 1.25 MG (50000 UNIT) CAPS capsule TAKE 1 CAPSULE BY MOUTH ONE TIME PER WEEK 05/15/24   Onesimo Emaline Brink, MD    Allergies: Xarelto  [rivaroxaban ], Corticosteroids, Ramipril , Zolpidem, and Benazepril     Review of Systems  Constitutional:  Negative for chills and fever.  Respiratory:  Positive for cough and shortness of breath.   Cardiovascular:  Positive for chest pain and palpitations. Negative for leg swelling.  Gastrointestinal:  Negative for  abdominal pain.  Neurological:  Negative for light-headedness.  All other systems reviewed and are negative.   Updated Vital Signs BP 137/73 (BP Location: Right Arm)   Pulse 76   Temp 98.8 F (37.1 C)   Resp 20   SpO2 100%   Physical Exam Vitals and nursing note reviewed.  Constitutional:      General: He is not in acute distress.    Appearance: Normal appearance. He is not ill-appearing.  HENT:     Head: Normocephalic and atraumatic.     Nose: Nose normal.  Eyes:     Conjunctiva/sclera: Conjunctivae normal.  Cardiovascular:     Rate and Rhythm: Normal rate and regular rhythm.  Pulmonary:     Effort: Pulmonary effort is normal. No respiratory distress.  Abdominal:     General: There is no distension.     Palpations: Abdomen is soft.     Tenderness: There is no abdominal tenderness. There is no guarding.  Musculoskeletal:        General: No deformity. Normal range of motion.     Cervical back: Normal range of motion.  Skin:    Findings: No rash.  Neurological:     Mental Status: He is alert.     (all labs ordered are listed, but only abnormal results are displayed) Labs Reviewed  BASIC METABOLIC PANEL WITH GFR - Abnormal; Notable for the following components:      Result Value   Glucose, Bld 125 (*)    Calcium  8.7 (*)    All other components within normal limits  CBC - Abnormal; Notable for the following components:   WBC 3.2 (*)    RBC 4.16 (*)    Platelets 100 (*)    All other components within normal limits  TROPONIN T, HIGH SENSITIVITY - Abnormal; Notable for the following components:   Troponin T High Sensitivity 23 (*)    All other components within normal limits  RESP PANEL BY RT-PCR (RSV, FLU A&B, COVID)  RVPGX2    EKG: None  Radiology: No results found.   Procedures   Medications Ordered in the ED  ipratropium-albuterol  (DUONEB) 0.5-2.5 (3) MG/3ML nebulizer solution 3 mL (has no administration in time range)  sodium chloride  0.9 % bolus  500 mL (500 mLs Intravenous New Bag/Given 09/09/24 1804)  fentaNYL  (SUBLIMAZE ) injection 25 mcg (25 mcg Intravenous Given 09/09/24 1808)    Clinical Course as of 09/09/24 2102  Sun Sep 09, 2024  1832 CBC with white count of 3.2.  No anemia.  BMP without acute concern.  Glucose of 125.  Initial troponin of 23.  Will trend.  Respiratory panel is positive for RSV.  EKG without acute ischemic change.  Chest x-ray shows some scarring but otherwise no acute process. [AA]    Clinical Course User Index [AA] Hildegard Loge, PA-C  Medical Decision Making Amount and/or Complexity of Data Reviewed Labs: ordered. Radiology: ordered.  Risk OTC drugs. Prescription drug management.   Medical Decision Making / ED Course   This patient presents to the ED for concern of cough, shortness of breath, chest pain, this involves an extensive number of treatment options, and is a complaint that carries with it a high risk of complications and morbidity.  The differential diagnosis includes ACS, PE, pneumonia, viral URI  MDM: 83 year old male with past medical history as above presents today with concern for viral URI however this has given him significant shortness of breath, chest pain and generalized bodyaches.  With his history of multiple myeloma they wanted to come in and be evaluated.  Will obtain labs.  Respiratory panel, chest x-ray and EKG.  Repeat troponin 25.  Flat. no concern for ACS.  Improved after the cough syrup and breathing treatment.  Patient is stable for discharge.  Discussed with attending.  Discussed close follow-up with PCP in the next couple days for reevaluation. Patient and wife voiced understanding and are in agreement with plan.  Telemetry throughout the ED stay with normal sinus rhythm with rates of 70s.  Additional history obtained: -Additional history obtained from wife at bedside -External records from outside source obtained and reviewed  including: Chart review including previous notes, labs, imaging, consultation notes   Lab Tests: -I ordered, reviewed, and interpreted labs.   The pertinent results include:   Labs Reviewed  BASIC METABOLIC PANEL WITH GFR - Abnormal; Notable for the following components:      Result Value   Glucose, Bld 125 (*)    Calcium  8.7 (*)    All other components within normal limits  CBC - Abnormal; Notable for the following components:   WBC 3.2 (*)    RBC 4.16 (*)    Platelets 100 (*)    All other components within normal limits  TROPONIN T, HIGH SENSITIVITY - Abnormal; Notable for the following components:   Troponin T High Sensitivity 23 (*)    All other components within normal limits  RESP PANEL BY RT-PCR (RSV, FLU A&B, COVID)  RVPGX2      EKG  EKG Interpretation Date/Time:    Ventricular Rate:    PR Interval:    QRS Duration:    QT Interval:    QTC Calculation:   R Axis:      Text Interpretation:           Imaging Studies ordered: I ordered imaging studies including chest x-ray I independently visualized and interpreted imaging. I agree with the radiologist interpretation   Medicines ordered and prescription drug management: Meds ordered this encounter  Medications   sodium chloride  0.9 % bolus 500 mL   fentaNYL  (SUBLIMAZE ) injection 25 mcg   ipratropium-albuterol  (DUONEB) 0.5-2.5 (3) MG/3ML nebulizer solution 3 mL    -I have reviewed the patients home medicines and have made adjustments as needed  Reevaluation: After the interventions noted above, I reevaluated the patient and found that they have :improved  Co morbidities that complicate the patient evaluation  Past Medical History:  Diagnosis Date   Ascending aortic aneurysm    Bicuspid aortic valve    Cancer (HCC)    CHF NYHA class I (no symptoms from ordinary activities), acute, diastolic (HCC)    Dysrhythmia 7990   A fib   Fatty liver    mild   Fracture    left proximal humerus   GERD  (gastroesophageal reflux disease)  GI bleeding 07/21/2018   post polypectomy   Hemorrhoids    HTN (hypertension)    Hypercholesteremia    Hypokalemia    Internal hemorrhoids    LBP (low back pain)    Moderate aortic stenosis    Osteoarthritis    Paroxysmal atrial fibrillation (HCC)    a. new onset Afib in 07/2008. He underwent ibutilide cardioversion successfully. b. Recurrence 01/2013 s/p TEE/DCCV - was on Xarelto  but he stopped it as he was convinced it was causing joint pn. c. Recurrence 01/2016 - spont conv to NSR. Pt took Eliquis  x1 mo then declined further anticoag. d. Recurrence 07/2016.   Pneumonia    Tubular adenoma of colon       Dispostion: Discharged in stable condition.  Return precaution discussed.  Patient voices understanding and is in agreement with plan.  Final diagnoses:  RSV (acute bronchiolitis due to respiratory syncytial virus)    ED Discharge Orders          Ordered    HYDROcodone  bit-homatropine (HYCODAN) 5-1.5 MG/5ML syrup  Every 6 hours PRN        09/09/24 2100               Hildegard Loge, PA-C 09/09/24 2105    Patt Alm Macho, MD 09/09/24 2342  "

## 2024-09-09 NOTE — Discharge Instructions (Signed)
 Your respiratory panel showed you had RSV infection.  Remainder of your blood work was reassuring.  X-ray did not show any concerning findings.  This will have to run its course.  If your breathing worsens please return to the emergency department.  I have sent a cough syrup into the pharmacy for you.  This will help suppress the cough.  Only use this sparingly for severe coughing spells.  Use guaifenesin  1200 mg this will help break up the mucus and make it easier for you to cough.  Follow-up with your primary care doctor in the next couple days for reevaluation.

## 2024-09-10 ENCOUNTER — Telehealth: Payer: Self-pay

## 2024-09-10 NOTE — Telephone Encounter (Signed)
 T/C from pt's wife stating pt went to the ED and tested positive for RSV.  He is scheduled for treatment on 1/2. Does he need to cancel?  Please advise

## 2024-09-14 ENCOUNTER — Inpatient Hospital Stay

## 2024-09-14 ENCOUNTER — Encounter: Payer: Self-pay | Admitting: Hematology

## 2024-09-20 ENCOUNTER — Inpatient Hospital Stay: Admitting: Internal Medicine

## 2024-09-21 ENCOUNTER — Inpatient Hospital Stay: Admitting: Hematology

## 2024-09-21 ENCOUNTER — Inpatient Hospital Stay: Attending: Hematology

## 2024-09-21 VITALS — BP 142/64 | HR 59 | Temp 97.6°F | Resp 20 | Wt 160.5 lb

## 2024-09-21 DIAGNOSIS — Z7189 Other specified counseling: Secondary | ICD-10-CM

## 2024-09-21 DIAGNOSIS — Z5112 Encounter for antineoplastic immunotherapy: Secondary | ICD-10-CM | POA: Insufficient documentation

## 2024-09-21 DIAGNOSIS — C9000 Multiple myeloma not having achieved remission: Secondary | ICD-10-CM | POA: Diagnosis not present

## 2024-09-21 DIAGNOSIS — Z5111 Encounter for antineoplastic chemotherapy: Secondary | ICD-10-CM | POA: Diagnosis not present

## 2024-09-21 LAB — COMPREHENSIVE METABOLIC PANEL WITH GFR
ALT: 34 U/L (ref 0–44)
AST: 23 U/L (ref 15–41)
Albumin: 4 g/dL (ref 3.5–5.0)
Alkaline Phosphatase: 101 U/L (ref 38–126)
Anion gap: 8 (ref 5–15)
BUN: 20 mg/dL (ref 8–23)
CO2: 28 mmol/L (ref 22–32)
Calcium: 9 mg/dL (ref 8.9–10.3)
Chloride: 104 mmol/L (ref 98–111)
Creatinine, Ser: 1.1 mg/dL (ref 0.61–1.24)
GFR, Estimated: >60 mL/min
Glucose, Bld: 111 mg/dL — ABNORMAL HIGH (ref 70–99)
Potassium: 4.3 mmol/L (ref 3.5–5.1)
Sodium: 140 mmol/L (ref 135–145)
Total Bilirubin: 0.4 mg/dL (ref 0.0–1.2)
Total Protein: 6.3 g/dL — ABNORMAL LOW (ref 6.5–8.1)

## 2024-09-21 LAB — CBC WITH DIFFERENTIAL (CANCER CENTER ONLY)
Abs Immature Granulocytes: 0.1 K/uL — ABNORMAL HIGH (ref 0.00–0.07)
Basophils Absolute: 0 K/uL (ref 0.0–0.1)
Basophils Relative: 0 %
Eosinophils Absolute: 0.1 K/uL (ref 0.0–0.5)
Eosinophils Relative: 2 %
HCT: 40.8 % (ref 39.0–52.0)
Hemoglobin: 13.9 g/dL (ref 13.0–17.0)
Immature Granulocytes: 2 %
Lymphocytes Relative: 12 %
Lymphs Abs: 0.7 K/uL (ref 0.7–4.0)
MCH: 32.6 pg (ref 26.0–34.0)
MCHC: 34.1 g/dL (ref 30.0–36.0)
MCV: 95.8 fL (ref 80.0–100.0)
Monocytes Absolute: 0.6 K/uL (ref 0.1–1.0)
Monocytes Relative: 10 %
Neutro Abs: 4.2 K/uL (ref 1.7–7.7)
Neutrophils Relative %: 74 %
Platelet Count: 168 K/uL (ref 150–400)
RBC: 4.26 MIL/uL (ref 4.22–5.81)
RDW: 14.2 % (ref 11.5–15.5)
WBC Count: 5.7 K/uL (ref 4.0–10.5)
nRBC: 0 % (ref 0.0–0.2)

## 2024-09-21 MED ORDER — DEXAMETHASONE 6 MG PO TABS
20.0000 mg | ORAL_TABLET | Freq: Once | ORAL | Status: AC
  Administered 2024-09-21: 20 mg via ORAL
  Filled 2024-09-21: qty 2

## 2024-09-21 MED ORDER — ACETAMINOPHEN 325 MG PO TABS
650.0000 mg | ORAL_TABLET | Freq: Once | ORAL | Status: AC
  Administered 2024-09-21: 650 mg via ORAL
  Filled 2024-09-21: qty 2

## 2024-09-21 MED ORDER — DARATUMUMAB-HYALURONIDASE-FIHJ 1800-30000 MG-UT/15ML ~~LOC~~ SOLN
1800.0000 mg | Freq: Once | SUBCUTANEOUS | Status: AC
  Administered 2024-09-21: 1800 mg via SUBCUTANEOUS
  Filled 2024-09-21: qty 15

## 2024-09-21 MED ORDER — DIPHENHYDRAMINE HCL 25 MG PO CAPS
50.0000 mg | ORAL_CAPSULE | Freq: Once | ORAL | Status: AC
  Administered 2024-09-21: 50 mg via ORAL
  Filled 2024-09-21: qty 2

## 2024-09-21 NOTE — Patient Instructions (Signed)
 [facility] - A DEPT OF [facility]  Discharge Instructions: Thank you for choosing [facility] to provide your oncology and hematology care.   If you have a lab appointment with the Cancer Center, please go directly to the Cancer Center and check in at the registration area.   Wear comfortable clothing and clothing appropriate for easy access to any Portacath or PICC line.   We strive to give you quality time with your provider. You may need to reschedule your appointment if you arrive late (15 or more minutes).  Arriving late affects you and other patients whose appointments are after yours.  Also, if you miss three or more appointments without notifying the office, you may be dismissed from the clinic at the provider's discretion.      For prescription refill requests, have your pharmacy contact our office and allow 72 hours for refills to be completed.    Today you received the following chemotherapy and/or immunotherapy agents darzalex faspro      To help prevent nausea and vomiting after your treatment, we encourage you to take your nausea medication as directed.  BELOW ARE SYMPTOMS THAT SHOULD BE REPORTED IMMEDIATELY: *FEVER GREATER THAN 100.4 F (38 C) OR HIGHER *CHILLS OR SWEATING *NAUSEA AND VOMITING THAT IS NOT CONTROLLED WITH YOUR NAUSEA MEDICATION *UNUSUAL SHORTNESS OF BREATH *UNUSUAL BRUISING OR BLEEDING *URINARY PROBLEMS (pain or burning when urinating, or frequent urination) *BOWEL PROBLEMS (unusual diarrhea, constipation, pain near the anus) TENDERNESS IN MOUTH AND THROAT WITH OR WITHOUT PRESENCE OF ULCERS (sore throat, sores in mouth, or a toothache) UNUSUAL RASH, SWELLING OR PAIN  UNUSUAL VAGINAL DISCHARGE OR ITCHING   Items with * indicate a potential emergency and should be followed up as soon as possible or go to the Emergency Department if any problems should occur.  Please show the CHEMOTHERAPY ALERT CARD or  IMMUNOTHERAPY ALERT CARD at check-in to the Emergency Department and triage nurse.  Should you have questions after your visit or need to cancel or reschedule your appointment, please contact [facility] - A DEPT OF [facility]  Dept: [phone]  and follow the prompts.  Office hours are 8:00 a.m. to 4:30 p.m. Monday - Friday. Please note that voicemails left after 4:00 p.m. may not be returned until the following business day.  We are closed weekends and major holidays. You have access to a nurse at all times for urgent questions. Please call the main number to the clinic Dept: [phone] and follow the prompts.   For any non-urgent questions, you may also contact your provider using MyChart. We now offer e-Visits for anyone 15 and older to request care online for non-urgent symptoms. For details visit [email or web address].   Also download the MyChart app! Go to the app store, search "MyChart", open the app, select [place], and log in with your MyChart username and password.

## 2024-09-21 NOTE — Progress Notes (Signed)
 " HEMATOLOGY ONCOLOGY PROGRESS NOTE  Date of service: 09/21/2024  Patient Care Team: [name], MD as PCP - General [name], MD as PCP - Advanced Heart Failure (Cardiology) [name], MD as PCP - Cardiology (Cardiology) [name], MD as Attending Physician (Neurosurgery) [name], MD as Consulting Physician (Dermatology) [name], MD as Consulting Physician (Pulmonary Disease) Patel, [name], DO as Consulting Physician (Neurology)  CHIEF COMPLAINT/PURPOSE OF CONSULTATION: Follow-up for continued evaluation and management of multiple myeloma.  HISTORY OF PRESENTING ILLNESS: See previous notes for details on initial presentation.   SUMMARY OF ONCOLOGIC HISTORY: Oncology History  Multiple myeloma not having achieved remission (HCC)  05/17/2019 Initial Diagnosis   Multiple myeloma not having achieved remission (HCC)   05/22/2019 - 07/17/2019 Chemotherapy   The patient had dexamethasone  (DECADRON ) tablet 20 mg, 20 mg, Oral,  Once, 2 of 2 cycles Administration: 20 mg (05/29/2019), 10 mg (06/12/2019) lenalidomide  (REVLIMID ) 15 MG capsule, 15 mg, Oral, Daily, 1 of 1 cycle, Start date: 07/05/2019, End date: 10/02/2019 bortezomib  SQ (VELCADE ) chemo injection 2.75 mg, 1.3 mg/m2 = 2.75 mg, Subcutaneous,  Once, 3 of 3 cycles Dose modification: 1 mg/m2 (original dose 1.3 mg/m2, Cycle 2, Reason: Other (see comments)) Administration: 2.75 mg (06/12/2019), 2.75 mg (05/25/2019), 2.75 mg (05/29/2019), 2.75 mg (06/01/2019), 2.75 mg (05/22/2019), 2.75 mg (06/15/2019), 2.75 mg (06/19/2019), 2 mg (06/22/2019), 2.75 mg (07/03/2019), 2.75 mg (07/10/2019)  for chemotherapy treatment.    07/27/2024 -  Chemotherapy   Patient is on Treatment Plan : MYELOMA Daratumumab  SQ q28d     Malignant neoplasm metastatic to bone (HCC)  05/17/2019 Initial Diagnosis   Bone metastases (HCC)   05/22/2019 - 07/17/2019 Chemotherapy   The patient had dexamethasone  (DECADRON ) tablet 20 mg, 20 mg, Oral,   Once, 2 of 2 cycles Administration: 20 mg (05/29/2019), 10 mg (06/12/2019) lenalidomide  (REVLIMID ) 15 MG capsule, 15 mg, Oral, Daily, 1 of 1 cycle, Start date: 07/05/2019, End date: 10/02/2019 bortezomib  SQ (VELCADE ) chemo injection 2.75 mg, 1.3 mg/m2 = 2.75 mg, Subcutaneous,  Once, 3 of 3 cycles Dose modification: 1 mg/m2 (original dose 1.3 mg/m2, Cycle 2, Reason: Other (see comments)) Administration: 2.75 mg (06/12/2019), 2.75 mg (05/25/2019), 2.75 mg (05/29/2019), 2.75 mg (06/01/2019), 2.75 mg (05/22/2019), 2.75 mg (06/15/2019), 2.75 mg (06/19/2019), 2 mg (06/22/2019), 2.75 mg (07/03/2019), 2.75 mg (07/10/2019)  for chemotherapy treatment.      INTERVAL HISTORY: [name] [name] is a 84 y.o. male who is here today for continued evaluation and management of multiple myeloma.   he was last seen by me on 08/24/2024; at the time he mentioned experiencing abdominal pain and insomnia.   Today, he feels improved. He recently has a virus that caused an increase in mucus production and fatigue with a lingering cough that lasted 7-8 days. He was prescribed an antibiotic prophylaxis, however he could not recall which antibiotic.   He reports he is sleeping better and maintaining a good schedule.   He denies any SOB, or change in breathing since viral infection. He also denies bone pain.    REVIEW OF SYSTEMS:   10 Point review of systems of done and is negative except as noted above.  MEDICAL HISTORY Past Medical History:  Diagnosis Date   Ascending aortic aneurysm    Bicuspid aortic valve    Cancer (HCC)    CHF NYHA class I (no symptoms from ordinary activities), acute, diastolic (HCC)    Dysrhythmia 7990   A fib   Fatty liver  mild   Fracture    left proximal humerus   GERD (gastroesophageal reflux disease)    GI bleeding 07/21/2018   post polypectomy   Hemorrhoids    HTN (hypertension)    Hypercholesteremia    Hypokalemia    Internal hemorrhoids    LBP (low back pain)    Moderate aortic  stenosis    Osteoarthritis    Paroxysmal atrial fibrillation (HCC)    a. new onset Afib in 07/2008. He underwent ibutilide cardioversion successfully. b. Recurrence 01/2013 s/p TEE/DCCV - was on Xarelto  but he stopped it as he was convinced it was causing joint pn. c. Recurrence 01/2016 - spont conv to NSR. Pt took Eliquis  x1 mo then declined further anticoag. d. Recurrence 07/2016.   Pneumonia    Tubular adenoma of colon     SURGICAL HISTORY Past Surgical History:  Procedure Laterality Date   BACK SURGERY  x12 years ago   CARDIAC CATHETERIZATION  2020   CARDIAC VALVE REPLACEMENT  2020   CARDIOVERSION N/A 01/26/2013   Procedure: CARDIOVERSION;  Surgeon: Ezra GORMAN Shuck, MD;  Location: [facility] ENDOSCOPY;  Service: Cardiovascular;  Laterality: N/A;   CARDIOVERSION N/A 10/28/2017   Procedure: CARDIOVERSION;  Surgeon: Shuck Ezra GORMAN, MD;  Location: [facility] ENDOSCOPY;  Service: Cardiovascular;  Laterality: N/A;   CARDIOVERSION N/A 03/03/2018   Procedure: CARDIOVERSION;  Surgeon: Pietro Redell GORMAN, MD;  Location: [facility] ENDOSCOPY;  Service: Cardiovascular;  Laterality: N/A;   CARDIOVERSION N/A 09/19/2019   Procedure: CARDIOVERSION;  Surgeon: Shuck Ezra GORMAN, MD;  Location: [facility] ENDOSCOPY;  Service: Cardiovascular;  Laterality: N/A;   COLONOSCOPY     COLONOSCOPY  07/17/2018   at [facility]   HEMORRHOID SURGERY     INGUINAL HERNIA REPAIR Left 03/18/2020   Procedure: OPEN LEFT INGUINAL HERNIA REPAIR;  Surgeon: Ethyl Lenis, MD;  Location: [facility] OR;  Service: General;  Laterality: Left;   LUMBAR LAMINECTOMY     ORIF HUMERUS FRACTURE Left 01/15/2020   Procedure: OPEN REDUCTION INTERNAL FIXATION (ORIF) PROXIMAL HUMERUS FRACTURE;  Surgeon: Sharl Selinda Dover, MD;  Location: [facility] OR;  Service: Orthopedics;  Laterality: Left;   POLYPECTOMY     RIGHT HEART CATH N/A 08/20/2019   Procedure: RIGHT HEART CATH;  Surgeon: Shuck Ezra GORMAN, MD;  Location: [facility] INVASIVE CV LAB;  Service: Cardiovascular;  Laterality: N/A;   RIGHT/LEFT HEART  CATH AND CORONARY ANGIOGRAPHY N/A 03/07/2019   Procedure: RIGHT/LEFT HEART CATH AND CORONARY ANGIOGRAPHY;  Surgeon: Verlin Lonni BIRCH, MD;  Location: [facility] INVASIVE CV LAB;  Service: Cardiovascular;  Laterality: N/A;   TAVAR  04/2019   TEE WITHOUT CARDIOVERSION N/A 01/26/2013   Procedure: TRANSESOPHAGEAL ECHOCARDIOGRAM (TEE);  Surgeon: Ezra GORMAN Shuck, MD;  Location: [facility] ENDOSCOPY;  Service: Cardiovascular;  Laterality: N/A;   TEE WITHOUT CARDIOVERSION N/A 10/28/2017   Procedure: TRANSESOPHAGEAL ECHOCARDIOGRAM (TEE);  Surgeon: Shuck Ezra GORMAN, MD;  Location: [facility] ENDOSCOPY;  Service: Cardiovascular;  Laterality: N/A;   TEE WITHOUT CARDIOVERSION N/A 05/08/2019   Procedure: TRANSESOPHAGEAL ECHOCARDIOGRAM (TEE);  Surgeon: Verlin Lonni BIRCH, MD;  Location: [facility] INVASIVE CV LAB;  Service: Open Heart Surgery;  Laterality: N/A;   TEE WITHOUT CARDIOVERSION N/A 09/19/2019   Procedure: TRANSESOPHAGEAL ECHOCARDIOGRAM (TEE);  Surgeon: Shuck Ezra GORMAN, MD;  Location: [facility] ENDOSCOPY;  Service: Cardiovascular;  Laterality: N/A;   TRANSCATHETER AORTIC VALVE REPLACEMENT, TRANSFEMORAL N/A 05/08/2019   Procedure: TRANSCATHETER AORTIC VALVE REPLACEMENT, TRANSFEMORAL;  Surgeon: Verlin Lonni BIRCH, MD;  Location: [facility] INVASIVE CV LAB;  Service: Open Heart Surgery;  Laterality: N/A;  SOCIAL HISTORY Social History[1]  Social History   Social History Narrative   Patient lives in [place] w/ his wife. 2025/   He is a native of [place]. 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   Right-handed   Caffeine: 2 cups coffee per day   Two story home    SOCIAL DRIVERS OF HEALTH SDOH Screenings   Food Insecurity: No Food Insecurity (06/14/2024)  Housing: Unknown (06/14/2024)  Transportation Needs: No Transportation Needs (06/14/2024)  Utilities: Not At Risk (06/14/2024)  Alcohol Screen: Low Risk (06/14/2024)  Depression (PHQ2-9): Low Risk (08/31/2024)  Financial Resource Strain:  Low Risk (06/14/2024)  Physical Activity: Inactive (06/14/2024)  Social Connections: Moderately Integrated (06/14/2024)  Stress: No Stress Concern Present (06/14/2024)  Tobacco Use: Low Risk (09/09/2024)  Health Literacy: Adequate Health Literacy (06/14/2024)     FAMILY HISTORY Family History  Problem Relation Age of Onset   Colon cancer Mother 50   Hypertension Other    Coronary artery disease Neg Hx    Colon polyps Neg Hx    Esophageal cancer Neg Hx    Rectal cancer Neg Hx    Stomach cancer Neg Hx      ALLERGIES: is allergic to xarelto  [rivaroxaban ], corticosteroids, ramipril , zolpidem, and benazepril .  MEDICATIONS  Current Outpatient Medications  Medication Sig Dispense Refill   acyclovir  (ZOVIRAX ) 400 MG tablet Take 1 tablet (400 mg total) by mouth 2 (two) times daily. (Patient not taking: Reported on 07/18/2024) 60 tablet 11   aspirin  81 MG chewable tablet CHEW 1 TABLET BY MOUTH DAILY. 90 tablet 1   Carboxymethylcellul-Glycerin (LUBRICATING EYE DROPS OP) Place 1 drop into both eyes daily as needed (dry eyes).     Cholecalciferol  (VITAMIN D ) 50 MCG (2000 UT) tablet Take 2,000 Units by mouth daily.     dexamethasone  (DECADRON ) 4 MG tablet Take 1 tablet (4 mg total) by mouth daily. Take for 2 days starting the night of chemotherapy. 20 tablet 4   diclofenac  Sodium (VOLTAREN ) 1 % GEL Apply 1 application topically 2 (two) times daily as needed (pain.).     ELIQUIS  5 MG TABS tablet TAKE 1 TABLET BY MOUTH TWICE A DAY 180 tablet 0   gabapentin  (NEURONTIN ) 300 MG capsule TAKE 1 CAPSULE BY MOUTH EVERYDAY AT BEDTIME 90 capsule 3   HYDROcodone  bit-homatropine (HYCODAN) 5-1.5 MG/5ML syrup Take 5 mLs by mouth every 6 (six) hours as needed for cough. 120 mL 0   ipratropium (ATROVENT ) 0.06 % nasal spray PLACE 2 SPRAYS INTO THE NOSE 3 (THREE) TIMES DAILY. 15 mL 2   lenalidomide  (REVLIMID ) 10 MG capsule Take 1 capsule (10 mg total) by mouth daily. Take 1 capsule (10 mg total) by mouth daily for 21  days. Take 7 days off. Repeat cycle. 21 capsule 0   lidocaine -prilocaine  (EMLA ) cream Apply to affected area once 30 g 3   Magnesium  Oxide -Mg Supplement 250 MG TABS Take 250 mg by mouth daily.     metroNIDAZOLE  (METROCREAM ) 0.75 % cream Apply 1 application  topically 2 (two) times daily as needed (rash).     ondansetron  (ZOFRAN ) 8 MG tablet Take 1 tablet (8 mg total) by mouth every 8 (eight) hours as needed for nausea or vomiting. 30 tablet 1   oxyCODONE  (OXY IR/ROXICODONE ) 5 MG immediate release tablet Take 1-2 tablets (5-10 mg total) by mouth 2 (two) times daily as needed for severe pain (pain score 7-10). 120 tablet 0   prochlorperazine  (COMPAZINE ) 10 MG  tablet Take 1 tablet (10 mg total) by mouth every 6 (six) hours as needed for nausea or vomiting. 30 tablet 1   senna-docusate (SENOKOT-S) 8.6-50 MG tablet Take 2 tablets by mouth daily as needed for mild constipation or moderate constipation.     spironolactone  (ALDACTONE ) 25 MG tablet TAKE 1 TABLET BY MOUTH EVERY DAY 90 tablet 3   Vitamin D , Ergocalciferol , (DRISDOL ) 1.25 MG (50000 UNIT) CAPS capsule TAKE 1 CAPSULE BY MOUTH ONE TIME PER WEEK 12 capsule 3   No current facility-administered medications for this visit.    PHYSICAL EXAMINATION: ECOG PERFORMANCE STATUS: 1 - Symptomatic but completely ambulatory VITALS: There were no vitals filed for this visit. There were no vitals filed for this visit. There is no height or weight on file to calculate BMI.  GENERAL: alert, in no acute distress and comfortable SKIN: no acute rashes, no significant lesions EYES: conjunctiva are pink and non-injected, sclera anicteric OROPHARYNX: MMM, no exudates, no oropharyngeal erythema or ulceration NECK: supple, no JVD LYMPH:  no palpable lymphadenopathy in the cervical, axillary or inguinal regions LUNGS: clear to auscultation b/l with normal respiratory effort HEART: regular rate & rhythm ABDOMEN:  normoactive bowel sounds , non tender, not  distended, no hepatosplenomegaly Extremity: no pedal edema PSYCH: alert & oriented x 3 with fluent speech NEURO: no focal motor/sensory deficits  LABORATORY DATA:   I have reviewed the data as listed     Latest Ref Rng & Units 09/21/2024    1:59 PM 09/09/2024    5:16 PM 08/31/2024    8:46 AM  CBC EXTENDED  WBC 4.0 - 10.5 K/uL 5.7  3.2  4.0   RBC 4.22 - 5.81 MIL/uL 4.26  4.16  4.28   Hemoglobin 13.0 - 17.0 g/dL 86.0  86.3  86.2   HCT 39.0 - 52.0 % 40.8  40.4  40.6   Platelets 150 - 400 K/uL 168  100  90   NEUT# 1.7 - 7.7 K/uL 4.2  2.3  2.8   Lymph# 0.7 - 4.0 K/uL 0.7  0.4  0.7        Latest Ref Rng & Units 09/09/2024    5:16 PM 08/31/2024    8:46 AM 08/10/2024    8:49 AM  CMP  Glucose 70 - 99 mg/dL 874  864  898   BUN 8 - 23 mg/dL 20  22  29    Creatinine 0.61 - 1.24 mg/dL 9.01  8.85  8.81   Sodium 135 - 145 mmol/L 138  139  138   Potassium 3.5 - 5.1 mmol/L 4.1  4.0  4.4   Chloride 98 - 111 mmol/L 104  103  104   CO2 22 - 32 mmol/L 26  27  25    Calcium  8.9 - 10.3 mg/dL 8.7  9.0  8.8   Total Protein 6.5 - 8.1 g/dL  5.8  5.9   Total Bilirubin 0.0 - 1.2 mg/dL  0.5  0.6   Alkaline Phos 38 - 126 U/L  114  114   AST 15 - 41 U/L  32  27   ALT 0 - 44 U/L  60  47                05/24/2019 Bone Marrow Biopsy     04/16/2019 Surgical Pathology:    Surgical Pathology  CASE: [id number]  PATIENT: [facility]  Bone Marrow Report      Clinical History: Multiple myeloma, remission status unspecified (HCC)  (BH)  DIAGNOSIS:   BONE MARROW, ASPIRATE, CLOT, CORE:  -Variably cellular bone marrow with trilineage hematopoiesis and 2%  plasma cells  -See comment   PERIPHERAL BLOOD:  -Slight thrombocytopenia   RADIOGRAPHIC STUDIES: I have personally reviewed the radiological images as listed and agreed with the findings in the report. DG Chest Portable 1 View Result Date: 09/09/2024 CLINICAL DATA:  Cough and shortness of breath.  Congestion. EXAM: PORTABLE  CHEST 1 VIEW COMPARISON:  12/26/2023 FINDINGS: Stable heart size and mediastinal contours, TAVR. Chronic interstitial coarsening. Atelectasis/scarring at the left lung base. No confluent airspace disease. No pulmonary edema, large pleural effusion, or pneumothorax. Surgical hardware in the left proximal humerus. IMPRESSION: Chronic interstitial coarsening. Atelectasis/scarring at the left lung base. Electronically Signed   By: Andrea Gasman M.D.   On: 09/09/2024 18:19   CT BONE MARROW BIOPSY & ASPIRATION Addendum Date: 07/16/2024 ADDENDUM REPORT: 07/16/2024 11:16 ADDENDUM: 50 mcg of intravenous fentanyl  and 1.5 mg intravenous Versed  were administered for a total sedation time of 10 minutes. Electronically Signed   By: Cordella Banner   On: 07/16/2024 11:16   Result Date: 07/16/2024 CLINICAL DATA:  Elevated serum protein. Evaluate for myeloma multiple myeloma. Bone marrow biopsy for evaluation of myeloma progression. EXAM: CT-guided bone marrow biopsy TECHNIQUE: CT pelvis CONTRAST:  None RADIOPHARMACEUTICALS:  None COMPARISON:  None FINDINGS: The patient was placed in prone position on the CT gantry. Radiopaque markers were placed on the patient's skin and initial imaging of the pelvis was performed. The patient's skin was then prepped and draped in the usual sterile fashion. Moderate sedation was provided for by the nursing staff under my supervision utilizing intravenous Versed  and fentanyl . The nurse had no other duties other than monitoring the patient and providing sedation during the procedure. I was present for the entire procedure. 1% lidocaine  was used to infiltrate the skin at the access site prior to a stab incision. Local anesthesia was then used to infiltrate the region of soft tissue from the skin to the right iliac bone. The bone marrow needle was then advanced and imaging demonstrated the needle tip to be in the cortex of the right iliac bone. The bone was then penetrated and a sample was  obtained. After the sample was evaluated, approximately 5 mL of heparinized bone marrow sample was obtained by aspiration. A core sample was then obtained. Multiple attempts at sampling was performed in order to get 2 1 cm segments. All needles were then removed from the patient. Sterile dressing was applied. IMPRESSION: Satisfactory core needle biopsy and aspiration of the right iliac bone marrow under CT guidance. Electronically Signed: By: Cordella Banner On: 07/16/2024 11:13    ASSESSMENT & PLAN:  84 y.o. male with  #1 Multiple myeloma: --Initially treated with Velcade /Revlimid /Dexamethasone , started on 05/22/2019. Added Zometa  on 07/03/2019.  Discontinued Velcade  and Revlimid  after 07/10/2019 due to grade 2-3 neuropathy. He continued on Zometa  infusions --PET scan from 10/15/2021 showed persistent low level radiotracer uptake associated with the large lucent lesion within the superior right acetabulum. Additionally, there is a new focal area of increased radiotracer uptake above the background low level activity within this lesion with SUV max of 3.92. On the previous exam there was relatively homogeneous low level uptake within this lesion within SUV max of 2.43. Imaging findings are concerning for residual metabolically active tumor. --Bone marrow biopsy on 11/03/21 showed 2 % plasma cells.  --Received palliative radiation to right acetabulum from 11/30/2021-12/11/2021. Received 25 Gy in 10 Fx.  --Resumed  dose reduced Revlmid 5 mg PO daily 21 days on/7 days off every 28 days on 01/03/2022 --PET/CT scan from 05/31/2024 showed progression of multiple myeloma with new multifocal hypermetabolic osseous lesions. Kappa light chain had increased to 477.8 with ratio of 45.08 on 06/08/2024.  --Switched to Dara/Dex on 07/27/2024.    #2 Severe aortic stenosis with bicuspid aortic valve -10/26/2018 ECHO revealed AVA at 0.8 cm2 and LV EF of 60-65% -05/08/2019 pt had a Transfemoral Transcatheter Aortic Valve  Replacement   #3 B/L lower extremity neuropathy -Secondary from previous myeloma treatments (velcade ) and also radiculopathy due to chronic back issues -Currently takes oxycodone  and gabapentin   PLAN: - Discussed lab results on 09/21/2024 in detail with patient: -450 Kappa Light Chains down to 25, great response to Daratumumab   -CBC and CMP is stable -Will reduce dexamethazone 4 mg from 3 to 2 PO -instructed to reach out if coughing or respiratory symptoms worsen  FOLLOW-UP Continue treatment per integrated schedule.  The total time spent in the appointment was *** minutes* .  All of the patient's questions were answered and the patient knows to call the clinic with any problems, questions, or concerns.  Emaline Saran MD MS AAHIVMS [facility] Hematology/Oncology Physician [facility]  *Total Encounter Time as defined by the Centers for Medicare and Medicaid Services includes, in addition to the face-to-face time of a patient visit (documented in the note above) non-face-to-face time: obtaining and reviewing outside history, ordering and reviewing medications, tests or procedures, care coordination (communications with other health care professionals or caregivers) and documentation in the medical record.  I, Alan Blowers, acting as a neurosurgeon for Emaline Saran, MD.,have documented all relevant documentation on the behalf of Emaline Saran, MD,as directed by  Emaline Saran, MD while in the presence of Emaline Saran, MD.  I have reviewed the above documentation for accuracy and completeness, and I agree with the above.  Emaline Saran, MD    [1]  Social History Tobacco Use   Smoking status: Never   Smokeless tobacco: Never  Vaping Use   Vaping status: Never Used  Substance Use Topics   Alcohol use: Not Currently   Drug use: No   "

## 2024-09-22 ENCOUNTER — Other Ambulatory Visit: Payer: Self-pay

## 2024-09-27 ENCOUNTER — Encounter: Payer: Self-pay | Admitting: Hematology

## 2024-09-28 ENCOUNTER — Inpatient Hospital Stay

## 2024-09-28 VITALS — BP 146/67 | HR 58 | Temp 97.8°F | Resp 18

## 2024-09-28 DIAGNOSIS — Z7189 Other specified counseling: Secondary | ICD-10-CM

## 2024-09-28 DIAGNOSIS — C9000 Multiple myeloma not having achieved remission: Secondary | ICD-10-CM

## 2024-09-28 DIAGNOSIS — Z5112 Encounter for antineoplastic immunotherapy: Secondary | ICD-10-CM | POA: Diagnosis not present

## 2024-09-28 LAB — CBC WITH DIFFERENTIAL (CANCER CENTER ONLY)
Abs Immature Granulocytes: 0.04 K/uL (ref 0.00–0.07)
Basophils Absolute: 0 K/uL (ref 0.0–0.1)
Basophils Relative: 0 %
Eosinophils Absolute: 0.1 K/uL (ref 0.0–0.5)
Eosinophils Relative: 2 %
HCT: 39.5 % (ref 39.0–52.0)
Hemoglobin: 13.2 g/dL (ref 13.0–17.0)
Immature Granulocytes: 1 %
Lymphocytes Relative: 12 %
Lymphs Abs: 0.6 K/uL — ABNORMAL LOW (ref 0.7–4.0)
MCH: 32.5 pg (ref 26.0–34.0)
MCHC: 33.4 g/dL (ref 30.0–36.0)
MCV: 97.3 fL (ref 80.0–100.0)
Monocytes Absolute: 0.5 K/uL (ref 0.1–1.0)
Monocytes Relative: 10 %
Neutro Abs: 4.2 K/uL (ref 1.7–7.7)
Neutrophils Relative %: 75 %
Platelet Count: 117 K/uL — ABNORMAL LOW (ref 150–400)
RBC: 4.06 MIL/uL — ABNORMAL LOW (ref 4.22–5.81)
RDW: 14.6 % (ref 11.5–15.5)
WBC Count: 5.5 K/uL (ref 4.0–10.5)
nRBC: 0 % (ref 0.0–0.2)

## 2024-09-28 LAB — COMPREHENSIVE METABOLIC PANEL WITH GFR
ALT: 40 U/L (ref 0–44)
AST: 25 U/L (ref 15–41)
Albumin: 3.9 g/dL (ref 3.5–5.0)
Alkaline Phosphatase: 95 U/L (ref 38–126)
Anion gap: 8 (ref 5–15)
BUN: 19 mg/dL (ref 8–23)
CO2: 27 mmol/L (ref 22–32)
Calcium: 9 mg/dL (ref 8.9–10.3)
Chloride: 105 mmol/L (ref 98–111)
Creatinine, Ser: 1.16 mg/dL (ref 0.61–1.24)
GFR, Estimated: >60 mL/min
Glucose, Bld: 112 mg/dL — ABNORMAL HIGH (ref 70–99)
Potassium: 4.8 mmol/L (ref 3.5–5.1)
Sodium: 140 mmol/L (ref 135–145)
Total Bilirubin: 0.3 mg/dL (ref 0.0–1.2)
Total Protein: 5.8 g/dL — ABNORMAL LOW (ref 6.5–8.1)

## 2024-09-28 MED ORDER — DARATUMUMAB-HYALURONIDASE-FIHJ 1800-30000 MG-UT/15ML ~~LOC~~ SOLN
1800.0000 mg | Freq: Once | SUBCUTANEOUS | Status: AC
  Administered 2024-09-28: 1800 mg via SUBCUTANEOUS
  Filled 2024-09-28: qty 15

## 2024-09-28 MED ORDER — DIPHENHYDRAMINE HCL 25 MG PO CAPS
50.0000 mg | ORAL_CAPSULE | Freq: Once | ORAL | Status: AC
  Administered 2024-09-28: 50 mg via ORAL
  Filled 2024-09-28: qty 2

## 2024-09-28 MED ORDER — ACETAMINOPHEN 325 MG PO TABS
650.0000 mg | ORAL_TABLET | Freq: Once | ORAL | Status: AC
  Administered 2024-09-28: 650 mg via ORAL
  Filled 2024-09-28: qty 2

## 2024-09-28 MED ORDER — DEXAMETHASONE 6 MG PO TABS
20.0000 mg | ORAL_TABLET | Freq: Once | ORAL | Status: AC
  Administered 2024-09-28: 20 mg via ORAL
  Filled 2024-09-28: qty 2

## 2024-09-28 NOTE — Patient Instructions (Signed)
 [facility] - A DEPT OF [facility]  Discharge Instructions: Thank you for choosing [facility] to provide your oncology and hematology care.   If you have a lab appointment with the Cancer Center, please go directly to the Cancer Center and check in at the registration area.   Wear comfortable clothing and clothing appropriate for easy access to any Portacath or PICC line.   We strive to give you quality time with your provider. You may need to reschedule your appointment if you arrive late (15 or more minutes).  Arriving late affects you and other patients whose appointments are after yours.  Also, if you miss three or more appointments without notifying the office, you may be dismissed from the clinic at the provider's discretion.      For prescription refill requests, have your pharmacy contact our office and allow 72 hours for refills to be completed.    Today you received the following chemotherapy and/or immunotherapy agents: daratumumab-hyaluronidase-fihj (DARZALEX FASPRO       To help prevent nausea and vomiting after your treatment, we encourage you to take your nausea medication as directed.  BELOW ARE SYMPTOMS THAT SHOULD BE REPORTED IMMEDIATELY: *FEVER GREATER THAN 100.4 F (38 C) OR HIGHER *CHILLS OR SWEATING *NAUSEA AND VOMITING THAT IS NOT CONTROLLED WITH YOUR NAUSEA MEDICATION *UNUSUAL SHORTNESS OF BREATH *UNUSUAL BRUISING OR BLEEDING *URINARY PROBLEMS (pain or burning when urinating, or frequent urination) *BOWEL PROBLEMS (unusual diarrhea, constipation, pain near the anus) TENDERNESS IN MOUTH AND THROAT WITH OR WITHOUT PRESENCE OF ULCERS (sore throat, sores in mouth, or a toothache) UNUSUAL RASH, SWELLING OR PAIN  UNUSUAL VAGINAL DISCHARGE OR ITCHING   Items with * indicate a potential emergency and should be followed up as soon as possible or go to the Emergency Department if any problems should occur.  Please show the  CHEMOTHERAPY ALERT CARD or IMMUNOTHERAPY ALERT CARD at check-in to the Emergency Department and triage nurse.  Should you have questions after your visit or need to cancel or reschedule your appointment, please contact [facility] - A DEPT OF [facility]  Dept: [phone]  and follow the prompts.  Office hours are 8:00 a.m. to 4:30 p.m. Monday - Friday. Please note that voicemails left after 4:00 p.m. may not be returned until the following business day.  We are closed weekends and major holidays. You have access to a nurse at all times for urgent questions. Please call the main number to the clinic Dept: [phone] and follow the prompts.   For any non-urgent questions, you may also contact your provider using MyChart. We now offer e-Visits for anyone 63 and older to request care online for non-urgent symptoms. For details visit [email or web address].   Also download the MyChart app! Go to the app store, search "MyChart", open the app, select [facility], and log in with your MyChart username and password.

## 2024-10-02 ENCOUNTER — Other Ambulatory Visit (HOSPITAL_COMMUNITY): Payer: Self-pay | Admitting: Cardiology

## 2024-10-04 ENCOUNTER — Encounter: Payer: Self-pay | Admitting: Internal Medicine

## 2024-10-04 ENCOUNTER — Ambulatory Visit: Admitting: Internal Medicine

## 2024-10-04 VITALS — BP 122/84 | HR 56 | Ht 69.0 in | Wt 163.2 lb

## 2024-10-04 DIAGNOSIS — J310 Chronic rhinitis: Secondary | ICD-10-CM

## 2024-10-04 DIAGNOSIS — I480 Paroxysmal atrial fibrillation: Secondary | ICD-10-CM

## 2024-10-04 DIAGNOSIS — M545 Low back pain, unspecified: Secondary | ICD-10-CM | POA: Diagnosis not present

## 2024-10-04 DIAGNOSIS — C9000 Multiple myeloma not having achieved remission: Secondary | ICD-10-CM

## 2024-10-04 DIAGNOSIS — I10 Essential (primary) hypertension: Secondary | ICD-10-CM | POA: Diagnosis not present

## 2024-10-04 DIAGNOSIS — R269 Unspecified abnormalities of gait and mobility: Secondary | ICD-10-CM | POA: Diagnosis not present

## 2024-10-04 DIAGNOSIS — G8929 Other chronic pain: Secondary | ICD-10-CM | POA: Diagnosis not present

## 2024-10-04 MED ORDER — OXYCODONE HCL 5 MG PO TABS: 5.0000 mg | ORAL_TABLET | Freq: Two times a day (BID) | ORAL | 0 refills | Status: AC | PRN

## 2024-10-04 MED ORDER — AZELASTINE HCL 0.1 % NA SOLN: 2.0000 | Freq: Two times a day (BID) | NASAL | 5 refills | Status: AC

## 2024-10-04 NOTE — Assessment & Plan Note (Addendum)
" °  Oxycodone  Rx   Potential benefits of a long term opioids use as well as potential risks (i.e. addiction risk, apnea etc) and complications (i.e. Somnolence, constipation and others) were explained to the patient and were aknowledged.  On chemo "

## 2024-10-04 NOTE — Progress Notes (Signed)
 "  Subjective:  Patient ID: [name] [name], male    DOB: 05-06-1941  Age: 84 y.o. MRN: [id number]  CC: Medical Management of Chronic Issues (3 Month follow up)   HPI [name] [name] presents for RSV in Dec 2025 f/u C/o runny nose all the time - watery d/c. Atrovent  helped a little He continues to feel fatigued.  Follow-up on chronic pain from multiple myeloma   Outpatient Medications Prior to Visit  Medication Sig Dispense Refill   aspirin  81 MG chewable tablet CHEW 1 TABLET BY MOUTH DAILY. 90 tablet 1   Carboxymethylcellul-Glycerin (LUBRICATING EYE DROPS OP) Place 1 drop into both eyes daily as needed (dry eyes).     Cholecalciferol  (VITAMIN D ) 50 MCG (2000 UT) tablet Take 2,000 Units by mouth daily.     dexamethasone  (DECADRON ) 4 MG tablet Take 1 tablet (4 mg total) by mouth daily. Take for 2 days starting the night of chemotherapy. 20 tablet 4   diclofenac  Sodium (VOLTAREN ) 1 % GEL Apply 1 application topically 2 (two) times daily as needed (pain.).     ELIQUIS  5 MG TABS tablet TAKE 1 TABLET BY MOUTH TWICE A DAY 180 tablet 0   gabapentin  (NEURONTIN ) 300 MG capsule TAKE 1 CAPSULE BY MOUTH EVERYDAY AT BEDTIME 90 capsule 3   ipratropium (ATROVENT ) 0.06 % nasal spray PLACE 2 SPRAYS INTO THE NOSE 3 (THREE) TIMES DAILY. 15 mL 2   lenalidomide  (REVLIMID ) 10 MG capsule Take 1 capsule (10 mg total) by mouth daily. Take 1 capsule (10 mg total) by mouth daily for 21 days. Take 7 days off. Repeat cycle. 21 capsule 0   lidocaine -prilocaine  (EMLA ) cream Apply to affected area once 30 g 3   Magnesium  Oxide -Mg Supplement 250 MG TABS Take 250 mg by mouth daily.     metroNIDAZOLE  (METROCREAM ) 0.75 % cream Apply 1 application  topically 2 (two) times daily as needed (rash).     ondansetron  (ZOFRAN ) 8 MG tablet Take 1 tablet (8 mg total) by mouth every 8 (eight) hours as needed for nausea or vomiting. 30 tablet 1   prochlorperazine  (COMPAZINE ) 10 MG tablet Take 1 tablet (10 mg total) by mouth every 6 (six)  hours as needed for nausea or vomiting. 30 tablet 1   senna-docusate (SENOKOT-S) 8.6-50 MG tablet Take 2 tablets by mouth daily as needed for mild constipation or moderate constipation.     spironolactone  (ALDACTONE ) 25 MG tablet TAKE 1 TABLET BY MOUTH EVERY DAY 90 tablet 3   Vitamin D , Ergocalciferol , (DRISDOL ) 1.25 MG (50000 UNIT) CAPS capsule TAKE 1 CAPSULE BY MOUTH ONE TIME PER WEEK 12 capsule 3   HYDROcodone  bit-homatropine (HYCODAN) 5-1.5 MG/5ML syrup Take 5 mLs by mouth every 6 (six) hours as needed for cough. 120 mL 0   oxyCODONE  (OXY IR/ROXICODONE ) 5 MG immediate release tablet Take 1-2 tablets (5-10 mg total) by mouth 2 (two) times daily as needed for severe pain (pain score 7-10). 120 tablet 0   acyclovir  (ZOVIRAX ) 400 MG tablet Take 1 tablet (400 mg total) by mouth 2 (two) times daily. (Patient not taking: Reported on 10/04/2024) 60 tablet 11   No facility-administered medications prior to visit.    ROS: Review of Systems  Constitutional:  Positive for fatigue. Negative for appetite change and unexpected weight change.  HENT:  Negative for congestion, nosebleeds, sneezing, sore throat and trouble swallowing.   Eyes:  Negative for itching and visual disturbance.  Respiratory:  Negative for cough.   Cardiovascular:  Negative for chest  pain, palpitations and leg swelling.  Gastrointestinal:  Negative for abdominal distention, blood in stool, diarrhea and nausea.  Genitourinary:  Negative for frequency and hematuria.  Musculoskeletal:  Positive for arthralgias, back pain and gait problem. Negative for joint swelling and neck pain.  Skin:  Negative for rash.  Neurological:  Negative for dizziness, tremors, speech difficulty and weakness.  Hematological:  Bruises/bleeds easily.  Psychiatric/Behavioral:  Negative for agitation, decreased concentration, dysphoric mood, sleep disturbance and suicidal ideas. The patient is nervous/anxious.     Objective:  BP 122/84   Pulse (!) 56   Ht  5' 9 (1.753 m)   Wt 163 lb 3.2 oz (74 kg)   SpO2 99%   BMI 24.10 kg/m   BP Readings from Last 3 Encounters:  10/05/24 130/69  10/04/24 122/84  09/28/24 (!) 146/67    Wt Readings from Last 3 Encounters:  10/05/24 162 lb 8 oz (73.7 kg)  10/04/24 163 lb 3.2 oz (74 kg)  09/21/24 160 lb 8 oz (72.8 kg)    Physical Exam Constitutional:      General: He is not in acute distress.    Appearance: Normal appearance. He is well-developed.     Comments: NAD  Eyes:     Conjunctiva/sclera: Conjunctivae normal.     Pupils: Pupils are equal, round, and reactive to light.  Neck:     Thyroid : No thyromegaly.     Vascular: No JVD.  Cardiovascular:     Rate and Rhythm: Normal rate and regular rhythm.     Heart sounds: Normal heart sounds. No murmur heard.    No friction rub. No gallop.  Pulmonary:     Effort: Pulmonary effort is normal. No respiratory distress.     Breath sounds: Normal breath sounds. No wheezing or rales.  Chest:     Chest wall: No tenderness.  Abdominal:     General: Bowel sounds are normal. There is no distension.     Palpations: Abdomen is soft. There is no mass.     Tenderness: There is no abdominal tenderness. There is no guarding or rebound.  Musculoskeletal:        General: Tenderness present. Normal range of motion.     Cervical back: Normal range of motion.     Right lower leg: No edema.     Left lower leg: No edema.  Lymphadenopathy:     Cervical: No cervical adenopathy.  Skin:    General: Skin is warm and dry.     Findings: No rash.  Neurological:     Mental Status: He is alert and oriented to person, place, and time.     Cranial Nerves: No cranial nerve deficit.     Motor: No abnormal muscle tone.     Coordination: Coordination normal.     Gait: Gait normal.     Deep Tendon Reflexes: Reflexes are normal and symmetric.  Psychiatric:        Behavior: Behavior normal.        Thought Content: Thought content normal.        Judgment: Judgment normal.    LS w/pain Using a cane to help him walk   Lab Results  Component Value Date   WBC 5.4 10/05/2024   HGB 14.0 10/05/2024   HCT 40.4 10/05/2024   PLT 109 (L) 10/05/2024   GLUCOSE 112 (H) 09/28/2024   CHOL 144 08/01/2018   TRIG 167.0 (H) 08/01/2018   HDL 28.20 (L) 08/01/2018   LDLDIRECT 151.5 11/20/2007  LDLCALC 82 08/01/2018   ALT 40 09/28/2024   AST 25 09/28/2024   NA 140 09/28/2024   K 4.8 09/28/2024   CL 105 09/28/2024   CREATININE 1.16 09/28/2024   BUN 19 09/28/2024   CO2 27 09/28/2024   TSH 5.338 (H) 06/23/2020   PSA 4.20 (H) 08/01/2018   INR 1.5 (H) 11/13/2022   HGBA1C 5.8 (H) 05/04/2019    DG Chest Portable 1 View Result Date: 09/09/2024 CLINICAL DATA:  Cough and shortness of breath.  Congestion. EXAM: PORTABLE CHEST 1 VIEW COMPARISON:  12/26/2023 FINDINGS: Stable heart size and mediastinal contours, TAVR. Chronic interstitial coarsening. Atelectasis/scarring at the left lung base. No confluent airspace disease. No pulmonary edema, large pleural effusion, or pneumothorax. Surgical hardware in the left proximal humerus. IMPRESSION: Chronic interstitial coarsening. Atelectasis/scarring at the left lung base. Electronically Signed   By: Andrea Gasman M.D.   On: 09/09/2024 18:19    Assessment & Plan:   Problem List Items Addressed This Visit     Essential hypertension   On Metoprolol , spironolactone       PAF (paroxysmal atrial fibrillation) (HCC)   On ASA, Eliquis       Back pain   Chronic pain due to multiple myeloma Oxycodone  prn  Potential benefits of a long term opioids use as well as potential risks (i.e. addiction risk, apnea etc) and complications (i.e. Somnolence, constipation and others) were explained to the patient and were aknowledged.       Relevant Medications   oxyCODONE  (OXY IR/ROXICODONE ) 5 MG immediate release tablet   Multiple myeloma (HCC)    Oxycodone  Rx   Potential benefits of a long term opioids use as well as potential risks  (i.e. addiction risk, apnea etc) and complications (i.e. Somnolence, constipation and others) were explained to the patient and were aknowledged.  On chemo      Gait disorder   Using a cane Better      Chronic rhinitis - Primary   C/o runny nose all the time - watery d/c. Atrovent  helped a little Try Astelin  ENT consult      Relevant Orders   Ambulatory referral to ENT      Meds ordered this encounter  Medications   oxyCODONE  (OXY IR/ROXICODONE ) 5 MG immediate release tablet    Sig: Take 1-2 tablets (5-10 mg total) by mouth 2 (two) times daily as needed for severe pain (pain score 7-10).    Dispense:  120 tablet    Refill:  0    Code: M54.9   azelastine  (ASTELIN ) 0.1 % nasal spray    Sig: Place 2 sprays into both nostrils 2 (two) times daily. Use in each nostril as directed    Dispense:  30 mL    Refill:  5      Follow-up: Return in about 3 months (around 01/02/2025) for a follow-up visit.  Marolyn Noel, MD "

## 2024-10-04 NOTE — Assessment & Plan Note (Signed)
 Using a cane Better

## 2024-10-04 NOTE — Patient Instructions (Signed)
 Dr. Emilio Aurora, MD Psychiatrist in [place], [place]  Address: [address], [place], [place] [address] Phone: [phone]

## 2024-10-04 NOTE — Assessment & Plan Note (Signed)
On Metoprolol, spironolactone

## 2024-10-04 NOTE — Assessment & Plan Note (Signed)
On  ASA, Eliquis

## 2024-10-04 NOTE — Assessment & Plan Note (Signed)
 C/o runny nose all the time - watery d/c. Atrovent  helped a little Try Astelin  ENT consult

## 2024-10-05 ENCOUNTER — Inpatient Hospital Stay

## 2024-10-05 ENCOUNTER — Inpatient Hospital Stay: Admitting: Hematology

## 2024-10-05 ENCOUNTER — Other Ambulatory Visit: Payer: Self-pay

## 2024-10-05 DIAGNOSIS — Z7189 Other specified counseling: Secondary | ICD-10-CM

## 2024-10-05 DIAGNOSIS — Z5112 Encounter for antineoplastic immunotherapy: Secondary | ICD-10-CM | POA: Diagnosis not present

## 2024-10-05 DIAGNOSIS — C9000 Multiple myeloma not having achieved remission: Secondary | ICD-10-CM

## 2024-10-05 LAB — CBC WITH DIFFERENTIAL (CANCER CENTER ONLY)
Abs Immature Granulocytes: 0.02 K/uL (ref 0.00–0.07)
Basophils Absolute: 0 K/uL (ref 0.0–0.1)
Basophils Relative: 0 %
Eosinophils Absolute: 0.2 K/uL (ref 0.0–0.5)
Eosinophils Relative: 3 %
HCT: 40.4 % (ref 39.0–52.0)
Hemoglobin: 14 g/dL (ref 13.0–17.0)
Immature Granulocytes: 0 %
Lymphocytes Relative: 11 %
Lymphs Abs: 0.6 K/uL — ABNORMAL LOW (ref 0.7–4.0)
MCH: 33.3 pg (ref 26.0–34.0)
MCHC: 34.7 g/dL (ref 30.0–36.0)
MCV: 96 fL (ref 80.0–100.0)
Monocytes Absolute: 0.5 K/uL (ref 0.1–1.0)
Monocytes Relative: 10 %
Neutro Abs: 4 K/uL (ref 1.7–7.7)
Neutrophils Relative %: 76 %
Platelet Count: 109 K/uL — ABNORMAL LOW (ref 150–400)
RBC: 4.21 MIL/uL — ABNORMAL LOW (ref 4.22–5.81)
RDW: 14.7 % (ref 11.5–15.5)
WBC Count: 5.4 K/uL (ref 4.0–10.5)
nRBC: 0 % (ref 0.0–0.2)

## 2024-10-05 MED ORDER — ACETAMINOPHEN 325 MG PO TABS
650.0000 mg | ORAL_TABLET | Freq: Once | ORAL | Status: AC
  Administered 2024-10-05: 650 mg via ORAL
  Filled 2024-10-05: qty 2

## 2024-10-05 MED ORDER — DIPHENHYDRAMINE HCL 25 MG PO CAPS
50.0000 mg | ORAL_CAPSULE | Freq: Once | ORAL | Status: AC
  Administered 2024-10-05: 50 mg via ORAL
  Filled 2024-10-05: qty 2

## 2024-10-05 MED ORDER — DEXAMETHASONE 6 MG PO TABS
20.0000 mg | ORAL_TABLET | Freq: Once | ORAL | Status: AC
  Administered 2024-10-05: 20 mg via ORAL
  Filled 2024-10-05: qty 2

## 2024-10-05 MED ORDER — DARATUMUMAB-HYALURONIDASE-FIHJ 1800-30000 MG-UT/15ML ~~LOC~~ SOLN
1800.0000 mg | Freq: Once | SUBCUTANEOUS | Status: AC
  Administered 2024-10-05: 1800 mg via SUBCUTANEOUS
  Filled 2024-10-05: qty 15

## 2024-10-05 NOTE — Patient Instructions (Signed)
 [facility] - A DEPT OF [facility]  Discharge Instructions: Thank you for choosing [facility] to provide your oncology and hematology care.   If you have a lab appointment with the Cancer Center, please go directly to the Cancer Center and check in at the registration area.   Wear comfortable clothing and clothing appropriate for easy access to any Portacath or PICC line.   We strive to give you quality time with your provider. You may need to reschedule your appointment if you arrive late (15 or more minutes).  Arriving late affects you and other patients whose appointments are after yours.  Also, if you miss three or more appointments without notifying the office, you may be dismissed from the clinic at the provider's discretion.      For prescription refill requests, have your pharmacy contact our office and allow 72 hours for refills to be completed.    Today you received the following chemotherapy and/or immunotherapy agents: daratumumab-hyaluronidase-fihj (DARZALEX FASPRO       To help prevent nausea and vomiting after your treatment, we encourage you to take your nausea medication as directed.  BELOW ARE SYMPTOMS THAT SHOULD BE REPORTED IMMEDIATELY: *FEVER GREATER THAN 100.4 F (38 C) OR HIGHER *CHILLS OR SWEATING *NAUSEA AND VOMITING THAT IS NOT CONTROLLED WITH YOUR NAUSEA MEDICATION *UNUSUAL SHORTNESS OF BREATH *UNUSUAL BRUISING OR BLEEDING *URINARY PROBLEMS (pain or burning when urinating, or frequent urination) *BOWEL PROBLEMS (unusual diarrhea, constipation, pain near the anus) TENDERNESS IN MOUTH AND THROAT WITH OR WITHOUT PRESENCE OF ULCERS (sore throat, sores in mouth, or a toothache) UNUSUAL RASH, SWELLING OR PAIN  UNUSUAL VAGINAL DISCHARGE OR ITCHING   Items with * indicate a potential emergency and should be followed up as soon as possible or go to the Emergency Department if any problems should occur.  Please show the  CHEMOTHERAPY ALERT CARD or IMMUNOTHERAPY ALERT CARD at check-in to the Emergency Department and triage nurse.  Should you have questions after your visit or need to cancel or reschedule your appointment, please contact [facility] - A DEPT OF [facility]  Dept: [phone]  and follow the prompts.  Office hours are 8:00 a.m. to 4:30 p.m. Monday - Friday. Please note that voicemails left after 4:00 p.m. may not be returned until the following business day.  We are closed weekends and major holidays. You have access to a nurse at all times for urgent questions. Please call the main number to the clinic Dept: [phone] and follow the prompts.   For any non-urgent questions, you may also contact your provider using MyChart. We now offer e-Visits for anyone 63 and older to request care online for non-urgent symptoms. For details visit [email or web address].   Also download the MyChart app! Go to the app store, search "MyChart", open the app, select [facility], and log in with your MyChart username and password.

## 2024-10-07 NOTE — Assessment & Plan Note (Signed)
 Chronic pain due to multiple myeloma Oxycodone  prn  Potential benefits of a long term opioids use as well as potential risks (i.e. addiction risk, apnea etc) and complications (i.e. Somnolence, constipation and others) were explained to the patient and were aknowledged.

## 2024-10-09 LAB — KAPPA/LAMBDA LIGHT CHAINS
Kappa free light chain: 12.9 mg/L (ref 3.3–19.4)
Kappa, lambda light chain ratio: 3.31 — ABNORMAL HIGH (ref 0.26–1.65)
Lambda free light chains: 3.9 mg/L — ABNORMAL LOW (ref 5.7–26.3)

## 2024-10-09 LAB — MULTIPLE MYELOMA PANEL, SERUM
Albumin SerPl Elph-Mcnc: 3.5 g/dL (ref 2.9–4.4)
Albumin/Glob SerPl: 1.7 (ref 0.7–1.7)
Alpha 1: 0.3 g/dL (ref 0.0–0.4)
Alpha2 Glob SerPl Elph-Mcnc: 0.6 g/dL (ref 0.4–1.0)
B-Globulin SerPl Elph-Mcnc: 0.9 g/dL (ref 0.7–1.3)
Gamma Glob SerPl Elph-Mcnc: 0.4 g/dL (ref 0.4–1.8)
Globulin, Total: 2.1 g/dL — ABNORMAL LOW (ref 2.2–3.9)
IgA: 30 mg/dL — ABNORMAL LOW (ref 61–437)
IgG (Immunoglobin G), Serum: 517 mg/dL — ABNORMAL LOW (ref 603–1613)
IgM (Immunoglobulin M), Srm: 13 mg/dL — ABNORMAL LOW (ref 15–143)
M Protein SerPl Elph-Mcnc: 0.2 g/dL — ABNORMAL HIGH
Total Protein ELP: 5.6 g/dL — ABNORMAL LOW (ref 6.0–8.5)

## 2024-10-16 ENCOUNTER — Other Ambulatory Visit (HOSPITAL_COMMUNITY): Payer: Self-pay | Admitting: Cardiology

## 2024-10-19 ENCOUNTER — Inpatient Hospital Stay: Admitting: Hematology

## 2024-10-19 ENCOUNTER — Inpatient Hospital Stay

## 2024-10-19 ENCOUNTER — Other Ambulatory Visit: Payer: Self-pay

## 2024-10-19 VITALS — BP 135/71 | HR 65 | Temp 97.9°F | Resp 17 | Ht 69.0 in | Wt 156.9 lb

## 2024-10-19 DIAGNOSIS — C9000 Multiple myeloma not having achieved remission: Secondary | ICD-10-CM

## 2024-10-19 DIAGNOSIS — C7951 Secondary malignant neoplasm of bone: Secondary | ICD-10-CM

## 2024-10-19 DIAGNOSIS — Z7189 Other specified counseling: Secondary | ICD-10-CM

## 2024-10-19 LAB — CBC WITH DIFFERENTIAL (CANCER CENTER ONLY)
Abs Immature Granulocytes: 0.01 10*3/uL (ref 0.00–0.07)
Basophils Absolute: 0 10*3/uL (ref 0.0–0.1)
Basophils Relative: 0 %
Eosinophils Absolute: 0.2 10*3/uL (ref 0.0–0.5)
Eosinophils Relative: 3 %
HCT: 40 % (ref 39.0–52.0)
Hemoglobin: 13.4 g/dL (ref 13.0–17.0)
Immature Granulocytes: 0 %
Lymphocytes Relative: 11 %
Lymphs Abs: 0.5 10*3/uL — ABNORMAL LOW (ref 0.7–4.0)
MCH: 33.2 pg (ref 26.0–34.0)
MCHC: 33.5 g/dL (ref 30.0–36.0)
MCV: 99 fL (ref 80.0–100.0)
Monocytes Absolute: 0.5 10*3/uL (ref 0.1–1.0)
Monocytes Relative: 11 %
Neutro Abs: 3.5 10*3/uL (ref 1.7–7.7)
Neutrophils Relative %: 75 %
Platelet Count: 133 10*3/uL — ABNORMAL LOW (ref 150–400)
RBC: 4.04 MIL/uL — ABNORMAL LOW (ref 4.22–5.81)
RDW: 14.1 % (ref 11.5–15.5)
WBC Count: 4.7 10*3/uL (ref 4.0–10.5)
nRBC: 0 % (ref 0.0–0.2)

## 2024-10-19 LAB — CMP (CANCER CENTER ONLY)
ALT: 25 U/L (ref 0–44)
AST: 25 U/L (ref 15–41)
Albumin: 4.2 g/dL (ref 3.5–5.0)
Alkaline Phosphatase: 94 U/L (ref 38–126)
Anion gap: 12 (ref 5–15)
BUN: 19 mg/dL (ref 8–23)
CO2: 26 mmol/L (ref 22–32)
Calcium: 9.4 mg/dL (ref 8.9–10.3)
Chloride: 101 mmol/L (ref 98–111)
Creatinine: 1.16 mg/dL (ref 0.61–1.24)
GFR, Estimated: >60 mL/min
Glucose, Bld: 100 mg/dL — ABNORMAL HIGH (ref 70–99)
Potassium: 4 mmol/L (ref 3.5–5.1)
Sodium: 140 mmol/L (ref 135–145)
Total Bilirubin: 0.5 mg/dL (ref 0.0–1.2)
Total Protein: 6.4 g/dL — ABNORMAL LOW (ref 6.5–8.1)

## 2024-10-19 MED ORDER — DEXAMETHASONE 6 MG PO TABS
20.0000 mg | ORAL_TABLET | Freq: Once | ORAL | Status: AC
  Administered 2024-10-19: 20 mg via ORAL
  Filled 2024-10-19: qty 2

## 2024-10-19 MED ORDER — ACETAMINOPHEN 325 MG PO TABS
650.0000 mg | ORAL_TABLET | Freq: Once | ORAL | Status: AC
  Administered 2024-10-19: 650 mg via ORAL
  Filled 2024-10-19: qty 2

## 2024-10-19 MED ORDER — DIPHENHYDRAMINE HCL 25 MG PO CAPS
50.0000 mg | ORAL_CAPSULE | Freq: Once | ORAL | Status: AC
  Administered 2024-10-19: 50 mg via ORAL
  Filled 2024-10-19: qty 2

## 2024-10-19 MED ORDER — DARATUMUMAB-HYALURONIDASE-FIHJ 1800-30000 MG-UT/15ML ~~LOC~~ SOLN
1800.0000 mg | Freq: Once | SUBCUTANEOUS | Status: AC
  Administered 2024-10-19: 1800 mg via SUBCUTANEOUS
  Filled 2024-10-19: qty 15

## 2024-10-19 MED ORDER — ZOLEDRONIC ACID 4 MG/100ML IV SOLN
4.0000 mg | Freq: Once | INTRAVENOUS | Status: AC
  Administered 2024-10-19: 4 mg via INTRAVENOUS
  Filled 2024-10-19: qty 100

## 2024-10-19 NOTE — Progress Notes (Incomplete)
 " HEMATOLOGY ONCOLOGY PROGRESS NOTE  Date of service: 10/19/2024  Patient Care Team: [name], MD as PCP - General [name], MD as PCP - Advanced Heart Failure (Cardiology) [name], MD as PCP - Cardiology (Cardiology) [name], MD as Attending Physician (Neurosurgery) [name], MD as Consulting Physician (Dermatology) [name], MD as Consulting Physician (Pulmonary Disease) Patel, [name], DO as Consulting Physician (Neurology)  CHIEF COMPLAINT/PURPOSE OF CONSULTATION: Follow-up for continued evaluation and management of multiple myeloma.  HISTORY OF PRESENTING ILLNESS: See previous notes for details on initial presentation.   SUMMARY OF ONCOLOGIC HISTORY: Oncology History  Multiple myeloma not having achieved remission (HCC)  05/17/2019 Initial Diagnosis   Multiple myeloma not having achieved remission (HCC)   05/22/2019 - 07/17/2019 Chemotherapy   The patient had dexamethasone  (DECADRON ) tablet 20 mg, 20 mg, Oral,  Once, 2 of 2 cycles Administration: 20 mg (05/29/2019), 10 mg (06/12/2019) lenalidomide  (REVLIMID ) 15 MG capsule, 15 mg, Oral, Daily, 1 of 1 cycle, Start date: 07/05/2019, End date: 10/02/2019 bortezomib  SQ (VELCADE ) chemo injection 2.75 mg, 1.3 mg/m2 = 2.75 mg, Subcutaneous,  Once, 3 of 3 cycles Dose modification: 1 mg/m2 (original dose 1.3 mg/m2, Cycle 2, Reason: Other (see comments)) Administration: 2.75 mg (06/12/2019), 2.75 mg (05/25/2019), 2.75 mg (05/29/2019), 2.75 mg (06/01/2019), 2.75 mg (05/22/2019), 2.75 mg (06/15/2019), 2.75 mg (06/19/2019), 2 mg (06/22/2019), 2.75 mg (07/03/2019), 2.75 mg (07/10/2019)  for chemotherapy treatment.    07/27/2024 -  Chemotherapy   Patient is on Treatment Plan : MYELOMA Daratumumab  SQ q28d     Malignant neoplasm metastatic to bone (HCC)  05/17/2019 Initial Diagnosis   Bone metastases (HCC)   05/22/2019 - 07/17/2019 Chemotherapy   The patient had dexamethasone  (DECADRON ) tablet 20 mg, 20 mg, Oral,   Once, 2 of 2 cycles Administration: 20 mg (05/29/2019), 10 mg (06/12/2019) lenalidomide  (REVLIMID ) 15 MG capsule, 15 mg, Oral, Daily, 1 of 1 cycle, Start date: 07/05/2019, End date: 10/02/2019 bortezomib  SQ (VELCADE ) chemo injection 2.75 mg, 1.3 mg/m2 = 2.75 mg, Subcutaneous,  Once, 3 of 3 cycles Dose modification: 1 mg/m2 (original dose 1.3 mg/m2, Cycle 2, Reason: Other (see comments)) Administration: 2.75 mg (06/12/2019), 2.75 mg (05/25/2019), 2.75 mg (05/29/2019), 2.75 mg (06/01/2019), 2.75 mg (05/22/2019), 2.75 mg (06/15/2019), 2.75 mg (06/19/2019), 2 mg (06/22/2019), 2.75 mg (07/03/2019), 2.75 mg (07/10/2019)  for chemotherapy treatment.      INTERVAL HISTORY: [name] [name] is a 84 y.o. male who is here today for continued evaluation and management of multiple myeloma. He is ambulating with a cane today.  he was last seen by me on 09/21/2024.  Today, he is doing well. He has been trying to eat well, notes this is inconsistent.  He takes his oxycodone  occasionally for neuropathy pain.   He denies any leg swelling, new infection issues, and change in breathing.   REVIEW OF SYSTEMS:   10 Point review of systems of done and is negative except as noted above.  MEDICAL HISTORY Past Medical History:  Diagnosis Date   Ascending aortic aneurysm    Bicuspid aortic valve    Cancer (HCC)    CHF NYHA class I (no symptoms from ordinary activities), acute, diastolic (HCC)    Dysrhythmia 7990   A fib   Fatty liver    mild   Fracture    left proximal humerus   GERD (gastroesophageal reflux disease)    GI bleeding 07/21/2018   post polypectomy   Hemorrhoids    HTN (hypertension)  Hypercholesteremia    Hypokalemia    Internal hemorrhoids    LBP (low back pain)    Moderate aortic stenosis    Osteoarthritis    Paroxysmal atrial fibrillation (HCC)    a. new onset Afib in 07/2008. He underwent ibutilide cardioversion successfully. b. Recurrence 01/2013 s/p TEE/DCCV - was on Xarelto  but he stopped  it as he was convinced it was causing joint pn. c. Recurrence 01/2016 - spont conv to NSR. Pt took Eliquis  x1 mo then declined further anticoag. d. Recurrence 07/2016.   Pneumonia    Tubular adenoma of colon     SURGICAL HISTORY Past Surgical History:  Procedure Laterality Date   BACK SURGERY  x12 years ago   CARDIAC CATHETERIZATION  2020   CARDIAC VALVE REPLACEMENT  2020   CARDIOVERSION N/A 01/26/2013   Procedure: CARDIOVERSION;  Surgeon: Ezra GORMAN Shuck, MD;  Location: [facility] ENDOSCOPY;  Service: Cardiovascular;  Laterality: N/A;   CARDIOVERSION N/A 10/28/2017   Procedure: CARDIOVERSION;  Surgeon: Shuck Ezra GORMAN, MD;  Location: [facility] ENDOSCOPY;  Service: Cardiovascular;  Laterality: N/A;   CARDIOVERSION N/A 03/03/2018   Procedure: CARDIOVERSION;  Surgeon: Pietro Redell GORMAN, MD;  Location: [facility] ENDOSCOPY;  Service: Cardiovascular;  Laterality: N/A;   CARDIOVERSION N/A 09/19/2019   Procedure: CARDIOVERSION;  Surgeon: Shuck Ezra GORMAN, MD;  Location: [facility] ENDOSCOPY;  Service: Cardiovascular;  Laterality: N/A;   COLONOSCOPY     COLONOSCOPY  07/17/2018   at [facility]   HEMORRHOID SURGERY     INGUINAL HERNIA REPAIR Left 03/18/2020   Procedure: OPEN LEFT INGUINAL HERNIA REPAIR;  Surgeon: Ethyl Lenis, MD;  Location: [facility] OR;  Service: General;  Laterality: Left;   LUMBAR LAMINECTOMY     ORIF HUMERUS FRACTURE Left 01/15/2020   Procedure: OPEN REDUCTION INTERNAL FIXATION (ORIF) PROXIMAL HUMERUS FRACTURE;  Surgeon: Sharl Selinda Dover, MD;  Location: [facility] OR;  Service: Orthopedics;  Laterality: Left;   POLYPECTOMY     RIGHT HEART CATH N/A 08/20/2019   Procedure: RIGHT HEART CATH;  Surgeon: Shuck Ezra GORMAN, MD;  Location: [facility] INVASIVE CV LAB;  Service: Cardiovascular;  Laterality: N/A;   RIGHT/LEFT HEART CATH AND CORONARY ANGIOGRAPHY N/A 03/07/2019   Procedure: RIGHT/LEFT HEART CATH AND CORONARY ANGIOGRAPHY;  Surgeon: Verlin Lonni BIRCH, MD;  Location: [facility] INVASIVE CV LAB;  Service: Cardiovascular;  Laterality: N/A;    TAVAR  04/2019   TEE WITHOUT CARDIOVERSION N/A 01/26/2013   Procedure: TRANSESOPHAGEAL ECHOCARDIOGRAM (TEE);  Surgeon: Ezra GORMAN Shuck, MD;  Location: [facility] ENDOSCOPY;  Service: Cardiovascular;  Laterality: N/A;   TEE WITHOUT CARDIOVERSION N/A 10/28/2017   Procedure: TRANSESOPHAGEAL ECHOCARDIOGRAM (TEE);  Surgeon: Shuck Ezra GORMAN, MD;  Location: [facility] ENDOSCOPY;  Service: Cardiovascular;  Laterality: N/A;   TEE WITHOUT CARDIOVERSION N/A 05/08/2019   Procedure: TRANSESOPHAGEAL ECHOCARDIOGRAM (TEE);  Surgeon: Verlin Lonni BIRCH, MD;  Location: [facility] INVASIVE CV LAB;  Service: Open Heart Surgery;  Laterality: N/A;   TEE WITHOUT CARDIOVERSION N/A 09/19/2019   Procedure: TRANSESOPHAGEAL ECHOCARDIOGRAM (TEE);  Surgeon: Shuck Ezra GORMAN, MD;  Location: [facility] ENDOSCOPY;  Service: Cardiovascular;  Laterality: N/A;   TRANSCATHETER AORTIC VALVE REPLACEMENT, TRANSFEMORAL N/A 05/08/2019   Procedure: TRANSCATHETER AORTIC VALVE REPLACEMENT, TRANSFEMORAL;  Surgeon: Verlin Lonni BIRCH, MD;  Location: [facility] INVASIVE CV LAB;  Service: Open Heart Surgery;  Laterality: N/A;    SOCIAL HISTORY Social History[1]  Social History   Social History Narrative   Patient lives in [place] w/ his wife. 2025/   He is a native of [place]. 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   Right-handed   Caffeine: 2 cups coffee per day   Two story home    SOCIAL DRIVERS OF HEALTH SDOH Screenings   Food Insecurity: No Food Insecurity (06/14/2024)  Housing: Unknown (06/14/2024)  Transportation Needs: No Transportation Needs (06/14/2024)  Utilities: Not At Risk (06/14/2024)  Alcohol Screen: Low Risk (06/14/2024)  Depression (PHQ2-9): Low Risk (10/19/2024)  Financial Resource Strain: Low Risk (06/14/2024)  Physical Activity: Inactive (06/14/2024)  Social Connections: Moderately Integrated (06/14/2024)  Stress: No Stress Concern Present (06/14/2024)  Tobacco Use: Low Risk (10/04/2024)  Health Literacy:  Adequate Health Literacy (06/14/2024)     FAMILY HISTORY Family History  Problem Relation Age of Onset   Colon cancer Mother 83   Hypertension Other    Coronary artery disease Neg Hx    Colon polyps Neg Hx    Esophageal cancer Neg Hx    Rectal cancer Neg Hx    Stomach cancer Neg Hx      ALLERGIES: is allergic to xarelto  [rivaroxaban ], corticosteroids, ramipril , zolpidem, and benazepril .  MEDICATIONS  Current Outpatient Medications  Medication Sig Dispense Refill   acyclovir  (ZOVIRAX ) 400 MG tablet Take 1 tablet (400 mg total) by mouth 2 (two) times daily. (Patient not taking: Reported on 10/04/2024) 60 tablet 11   aspirin  81 MG chewable tablet CHEW 1 TABLET BY MOUTH DAILY. 90 tablet 1   azelastine  (ASTELIN ) 0.1 % nasal spray Place 2 sprays into both nostrils 2 (two) times daily. Use in each nostril as directed 30 mL 5   Carboxymethylcellul-Glycerin (LUBRICATING EYE DROPS OP) Place 1 drop into both eyes daily as needed (dry eyes).     Cholecalciferol  (VITAMIN D ) 50 MCG (2000 UT) tablet Take 2,000 Units by mouth daily.     dexamethasone  (DECADRON ) 4 MG tablet Take 1 tablet (4 mg total) by mouth daily. Take for 2 days starting the night of chemotherapy. 20 tablet 4   diclofenac  Sodium (VOLTAREN ) 1 % GEL Apply 1 application topically 2 (two) times daily as needed (pain.).     ELIQUIS  5 MG TABS tablet TAKE 1 TABLET BY MOUTH TWICE A DAY 180 tablet 0   gabapentin  (NEURONTIN ) 300 MG capsule TAKE 1 CAPSULE BY MOUTH EVERYDAY AT BEDTIME 90 capsule 3   ipratropium (ATROVENT ) 0.06 % nasal spray PLACE 2 SPRAYS INTO THE NOSE 3 (THREE) TIMES DAILY. 15 mL 2   lenalidomide  (REVLIMID ) 10 MG capsule Take 1 capsule (10 mg total) by mouth daily. Take 1 capsule (10 mg total) by mouth daily for 21 days. Take 7 days off. Repeat cycle. 21 capsule 0   lidocaine -prilocaine  (EMLA ) cream Apply to affected area once 30 g 3   Magnesium  Oxide -Mg Supplement 250 MG TABS Take 250 mg by mouth daily.     metroNIDAZOLE   (METROCREAM ) 0.75 % cream Apply 1 application  topically 2 (two) times daily as needed (rash).     ondansetron  (ZOFRAN ) 8 MG tablet Take 1 tablet (8 mg total) by mouth every 8 (eight) hours as needed for nausea or vomiting. 30 tablet 1   oxyCODONE  (OXY IR/ROXICODONE ) 5 MG immediate release tablet Take 1-2 tablets (5-10 mg total) by mouth 2 (two) times daily as needed for severe pain (pain score 7-10). 120 tablet 0   prochlorperazine  (COMPAZINE ) 10 MG tablet Take 1 tablet (10 mg total) by mouth every 6 (six) hours as needed for nausea or vomiting. 30 tablet 1   senna-docusate (SENOKOT-S) 8.6-50 MG tablet Take 2 tablets by  mouth daily as needed for mild constipation or moderate constipation.     spironolactone  (ALDACTONE ) 25 MG tablet TAKE 1 TABLET BY MOUTH EVERY DAY 90 tablet 3   Vitamin D , Ergocalciferol , (DRISDOL ) 1.25 MG (50000 UNIT) CAPS capsule TAKE 1 CAPSULE BY MOUTH ONE TIME PER WEEK 12 capsule 3   No current facility-administered medications for this visit.    PHYSICAL EXAMINATION: ECOG PERFORMANCE STATUS: 1 - Symptomatic but completely ambulatory VITALS: Vitals:   10/19/24 1335  BP: 135/71  Pulse: 65  Resp: 17  Temp: 97.9 F (36.6 C)  SpO2: 99%   Filed Weights   10/19/24 1335 10/19/24 1336  Weight: 156 lb 14.4 oz (71.2 kg) 156 lb 14.4 oz (71.2 kg)   Body mass index is 23.17 kg/m.  GENERAL: alert, in no acute distress and comfortable SKIN: no acute rashes, no significant lesions EYES: conjunctiva are pink and non-injected, sclera anicteric OROPHARYNX: MMM, no exudates, no oropharyngeal erythema or ulceration NECK: supple, no JVD LYMPH:  no palpable lymphadenopathy in the cervical, axillary or inguinal regions LUNGS: clear to auscultation b/l with normal respiratory effort HEART: regular rate & rhythm ABDOMEN:  normoactive bowel sounds , non tender, not distended, no hepatosplenomegaly Extremity: no pedal edema PSYCH: alert & oriented x 3 with fluent speech NEURO: no  focal motor/sensory deficits  LABORATORY DATA:   I have reviewed the data as listed     Latest Ref Rng & Units 10/19/2024   12:29 PM 10/05/2024    1:13 PM 09/28/2024   12:48 PM  CBC EXTENDED  WBC 4.0 - 10.5 K/uL 4.7  5.4  5.5   RBC 4.22 - 5.81 MIL/uL 4.04  4.21  4.06   Hemoglobin 13.0 - 17.0 g/dL 86.5  85.9  86.7   HCT 39.0 - 52.0 % 40.0  40.4  39.5   Platelets 150 - 400 K/uL 133  109  117   NEUT# 1.7 - 7.7 K/uL 3.5  4.0  4.2   Lymph# 0.7 - 4.0 K/uL 0.5  0.6  0.6        Latest Ref Rng & Units 09/28/2024   12:48 PM 09/21/2024    1:59 PM 09/09/2024    5:16 PM  CMP  Glucose 70 - 99 mg/dL 887  888  874   BUN 8 - 23 mg/dL 19  20  20    Creatinine 0.61 - 1.24 mg/dL 8.83  8.89  9.01   Sodium 135 - 145 mmol/L 140  140  138   Potassium 3.5 - 5.1 mmol/L 4.8  4.3  4.1   Chloride 98 - 111 mmol/L 105  104  104   CO2 22 - 32 mmol/L 27  28  26    Calcium  8.9 - 10.3 mg/dL 9.0  9.0  8.7   Total Protein 6.5 - 8.1 g/dL 5.8  6.3    Total Bilirubin 0.0 - 1.2 mg/dL 0.3  0.4    Alkaline Phos 38 - 126 U/L 95  101    AST 15 - 41 U/L 25  23    ALT 0 - 44 U/L 40  34      Multiple Myeloma 10/05/2024    Kappa/lambda light chains 10/05/2024      05/24/2019 Bone Marrow Biopsy     04/16/2019 Surgical Pathology:    Surgical Pathology  CASE: [id number]  PATIENT: [facility]  Bone Marrow Report      Clinical History: Multiple myeloma, remission status unspecified (HCC)  (BH)   DIAGNOSIS:   BONE  MARROW, ASPIRATE, CLOT, CORE:  -Variably cellular bone marrow with trilineage hematopoiesis and 2%  plasma cells  -See comment   PERIPHERAL BLOOD:  -Slight thrombocytopenia    RADIOGRAPHIC STUDIES: I have personally reviewed the radiological images as listed and agreed with the findings in the report. DG Chest Portable 1 View Result Date: 09/09/2024 CLINICAL DATA:  Cough and shortness of breath.  Congestion. EXAM: PORTABLE CHEST 1 VIEW COMPARISON:  12/26/2023 FINDINGS: Stable heart  size and mediastinal contours, TAVR. Chronic interstitial coarsening. Atelectasis/scarring at the left lung base. No confluent airspace disease. No pulmonary edema, large pleural effusion, or pneumothorax. Surgical hardware in the left proximal humerus. IMPRESSION: Chronic interstitial coarsening. Atelectasis/scarring at the left lung base. Electronically Signed   By: Andrea Gasman M.D.   On: 09/09/2024 18:19    ASSESSMENT & PLAN:  84 y.o. male with  #1 Multiple myeloma: --Initially treated with Velcade /Revlimid /Dexamethasone , started on 05/22/2019. Added Zometa  on 07/03/2019.  Discontinued Velcade  and Revlimid  after 07/10/2019 due to grade 2-3 neuropathy. He continued on Zometa  infusions --PET scan from 10/15/2021 showed persistent low level radiotracer uptake associated with the large lucent lesion within the superior right acetabulum. Additionally, there is a new focal area of increased radiotracer uptake above the background low level activity within this lesion with SUV max of 3.92. On the previous exam there was relatively homogeneous low level uptake within this lesion within SUV max of 2.43. Imaging findings are concerning for residual metabolically active tumor. --Bone marrow biopsy on 11/03/21 showed 2 % plasma cells.  --Received palliative radiation to right acetabulum from 11/30/2021-12/11/2021. Received 25 Gy in 10 Fx.  --Resumed dose reduced Revlmid 5 mg PO daily 21 days on/7 days off every 28 days on 01/03/2022 --PET/CT scan from 05/31/2024 showed progression of multiple myeloma with new multifocal hypermetabolic osseous lesions. Kappa light chain had increased to 477.8 with ratio of 45.08 on 06/08/2024.  --Switched to Dara/Dex on 07/27/2024.    #2 Severe aortic stenosis with bicuspid aortic valve -10/26/2018 ECHO revealed AVA at 0.8 cm2 and LV EF of 60-65% -05/08/2019 pt had a Transfemoral Transcatheter Aortic Valve Replacement   #3 B/L lower extremity neuropathy -Secondary from  previous myeloma treatments (velcade ) and also radiculopathy due to chronic back issues -Currently takes oxycodone  and gabapentin     PLAN: -he is reponding well to treatment  -standard maintenance treatment every two weeks for four months  -discussed keeping gabapentin  dose the same   FOLLOW-UP RTC for visit with Dr. Onesimo in 4-6 weeks  The total time spent in the appointment was *** minutes* .  All of the patient's questions were answered and the patient knows to call the clinic with any problems, questions, or concerns.  Emaline Onesimo MD MS AAHIVMS [facility] Hematology/Oncology Physician [facility]  *Total Encounter Time as defined by the Centers for Medicare and Medicaid Services includes, in addition to the face-to-face time of a patient visit (documented in the note above) non-face-to-face time: obtaining and reviewing outside history, ordering and reviewing medications, tests or procedures, care coordination (communications with other health care professionals or caregivers) and documentation in the medical record.  I, Alan Blowers, acting as a neurosurgeon for Emaline Onesimo, MD.,have documented all relevant documentation on the behalf of Emaline Onesimo, MD,as directed by  Emaline Onesimo, MD while in the presence of Emaline Onesimo, MD.  I have reviewed the above documentation for accuracy and completeness, and I agree with the above.  Emaline Onesimo, MD    [1]  Social History Tobacco Use   Smoking status: Never   Smokeless tobacco: Never  Vaping Use   Vaping status: Never Used  Substance Use Topics   Alcohol use: Not Currently   Drug use: No   "

## 2024-10-19 NOTE — Patient Instructions (Signed)
 [facility] - A DEPT OF [facility]  Discharge Instructions: Thank you for choosing [facility] to provide your oncology and hematology care.   If you have a lab appointment with the Cancer Center, please go directly to the Cancer Center and check in at the registration area.   Wear comfortable clothing and clothing appropriate for easy access to any Portacath or PICC line.   We strive to give you quality time with your provider. You may need to reschedule your appointment if you arrive late (15 or more minutes).  Arriving late affects you and other patients whose appointments are after yours.  Also, if you miss three or more appointments without notifying the office, you may be dismissed from the clinic at the providers discretion.      For prescription refill requests, have your pharmacy contact our office and allow 72 hours for refills to be completed.    Today you received the following chemotherapy and/or immunotherapy agents faspro      To help prevent nausea and vomiting after your treatment, we encourage you to take your nausea medication as directed.  BELOW ARE SYMPTOMS THAT SHOULD BE REPORTED IMMEDIATELY: *FEVER GREATER THAN 100.4 F (38 C) OR HIGHER *CHILLS OR SWEATING *NAUSEA AND VOMITING THAT IS NOT CONTROLLED WITH YOUR NAUSEA MEDICATION *UNUSUAL SHORTNESS OF BREATH *UNUSUAL BRUISING OR BLEEDING *URINARY PROBLEMS (pain or burning when urinating, or frequent urination) *BOWEL PROBLEMS (unusual diarrhea, constipation, pain near the anus) TENDERNESS IN MOUTH AND THROAT WITH OR WITHOUT PRESENCE OF ULCERS (sore throat, sores in mouth, or a toothache) UNUSUAL RASH, SWELLING OR PAIN  UNUSUAL VAGINAL DISCHARGE OR ITCHING   Items with * indicate a potential emergency and should be followed up as soon as possible or go to the Emergency Department if any problems should occur.  Please show the CHEMOTHERAPY ALERT CARD or IMMUNOTHERAPY  ALERT CARD at check-in to the Emergency Department and triage nurse.  Should you have questions after your visit or need to cancel or reschedule your appointment, please contact [facility] - A DEPT OF [facility]  Dept: [phone]  and follow the prompts.  Office hours are 8:00 a.m. to 4:30 p.m. Monday - Friday. Please note that voicemails left after 4:00 p.m. may not be returned until the following business day.  We are closed weekends and major holidays. You have access to a nurse at all times for urgent questions. Please call the main number to the clinic Dept: [phone] and follow the prompts.   For any non-urgent questions, you may also contact your provider using MyChart. We now offer e-Visits for anyone 48 and older to request care online for non-urgent symptoms. For details visit [email or web address].   Also download the MyChart app! Go to the app store, search MyChart, open the app, select [facility], and log in with your MyChart username and password.

## 2024-11-02 ENCOUNTER — Inpatient Hospital Stay

## 2024-11-02 ENCOUNTER — Inpatient Hospital Stay: Admitting: Hematology

## 2024-11-16 ENCOUNTER — Inpatient Hospital Stay

## 2024-11-16 ENCOUNTER — Inpatient Hospital Stay: Admitting: Hematology

## 2024-11-30 ENCOUNTER — Inpatient Hospital Stay

## 2024-11-30 ENCOUNTER — Inpatient Hospital Stay: Attending: Hematology | Admitting: Hematology

## 2025-01-10 ENCOUNTER — Ambulatory Visit: Admitting: Internal Medicine
